# Patient Record
Sex: Female | Born: 1945 | Race: White | Smoking: Never smoker
Health system: Northeastern US, Academic
[De-identification: ages and names within clinical notes are randomized; demographics above are authoritative.]

## PROBLEM LIST (undated history)

## (undated) DIAGNOSIS — C4491 Basal cell carcinoma of skin, unspecified: Secondary | ICD-10-CM

## (undated) DIAGNOSIS — M1990 Unspecified osteoarthritis, unspecified site: Secondary | ICD-10-CM

## (undated) DIAGNOSIS — E039 Hypothyroidism, unspecified: Secondary | ICD-10-CM

## (undated) DIAGNOSIS — F32A Depression, unspecified: Secondary | ICD-10-CM

## (undated) DIAGNOSIS — M797 Fibromyalgia: Secondary | ICD-10-CM

## (undated) DIAGNOSIS — C259 Malignant neoplasm of pancreas, unspecified: Secondary | ICD-10-CM

## (undated) HISTORY — DX: Malignant neoplasm of pancreas, unspecified: C25.9

## (undated) HISTORY — PX: TONSILLECTOMY AND ADENOIDECTOMY: SHX28

## (undated) HISTORY — DX: Unspecified osteoarthritis, unspecified site: M19.90

## (undated) HISTORY — PX: APPENDECTOMY: SHX54

## (undated) HISTORY — PX: KNEE SURGERY: SHX244

## (undated) HISTORY — PX: CERVICAL FUSION: SHX112

## (undated) HISTORY — DX: Basal cell carcinoma of skin, unspecified: C44.91

## (undated) HISTORY — PX: OTHER SURGICAL HISTORY: SHX169

## (undated) HISTORY — PX: ROTATOR CUFF REPAIR: SHX139

---

## 1986-08-04 HISTORY — PX: HYSTERECTOMY: SHX81

## 2013-11-02 ENCOUNTER — Inpatient Hospital Stay: Admit: 2013-11-02 | Discharge: 2013-11-02 | Disposition: A | Discharge status: Home / Self Care | Payer: Self-pay

## 2014-11-10 ENCOUNTER — Inpatient Hospital Stay: Admit: 2014-11-10 | Discharge: 2014-11-10 | Disposition: A | Discharge status: Home / Self Care | Payer: Self-pay

## 2014-12-09 ENCOUNTER — Inpatient Hospital Stay: Admit: 2014-12-09 | Discharge: 2014-12-09 | Disposition: A | Discharge status: Home / Self Care | Payer: Self-pay

## 2015-12-20 ENCOUNTER — Inpatient Hospital Stay: Admit: 2015-12-20 | Discharge: 2015-12-20 | Disposition: A | Discharge status: Home / Self Care | Payer: Self-pay

## 2016-03-05 DIAGNOSIS — Z9221 Personal history of antineoplastic chemotherapy: Secondary | ICD-10-CM

## 2016-03-05 HISTORY — DX: Personal history of antineoplastic chemotherapy: Z92.21

## 2016-09-06 HISTORY — PX: CHOLECYSTECTOMY, LAPAROSCOPIC: SHX56

## 2016-09-17 ENCOUNTER — Inpatient Hospital Stay: Admit: 2016-09-17 | Discharge: 2016-09-17 | Disposition: A | Discharge status: Home / Self Care | Payer: Self-pay

## 2016-09-25 ENCOUNTER — Inpatient Hospital Stay: Admit: 2016-09-25 | Discharge: 2016-09-25 | Disposition: A | Discharge status: Home / Self Care | Payer: Self-pay

## 2016-09-27 ENCOUNTER — Encounter: Payer: Self-pay | Admitting: Gastroenterology

## 2016-09-29 ENCOUNTER — Inpatient Hospital Stay
Admission: AD | Admit: 2016-09-29 | Discharge: 2016-10-14 | DRG: 264 | Disposition: A | Discharge status: Home Health | Payer: PRIVATE HEALTH INSURANCE | Source: Other Acute Inpatient Hospital | Attending: Oncology | Admitting: Oncology

## 2016-09-29 ENCOUNTER — Inpatient Hospital Stay: Payer: PRIVATE HEALTH INSURANCE

## 2016-09-29 DIAGNOSIS — J9811 Atelectasis: Secondary | ICD-10-CM

## 2016-09-29 DIAGNOSIS — C250 Malignant neoplasm of head of pancreas: Principal | ICD-10-CM | POA: Diagnosis present

## 2016-09-29 DIAGNOSIS — M797 Fibromyalgia: Secondary | ICD-10-CM | POA: Diagnosis present

## 2016-09-29 DIAGNOSIS — R0902 Hypoxemia: Secondary | ICD-10-CM | POA: Diagnosis present

## 2016-09-29 DIAGNOSIS — Z833 Family history of diabetes mellitus: Secondary | ICD-10-CM

## 2016-09-29 DIAGNOSIS — J90 Pleural effusion, not elsewhere classified: Secondary | ICD-10-CM

## 2016-09-29 DIAGNOSIS — F419 Anxiety disorder, unspecified: Secondary | ICD-10-CM | POA: Diagnosis present

## 2016-09-29 DIAGNOSIS — Z803 Family history of malignant neoplasm of breast: Secondary | ICD-10-CM

## 2016-09-29 DIAGNOSIS — R110 Nausea: Secondary | ICD-10-CM | POA: Diagnosis not present

## 2016-09-29 DIAGNOSIS — K5900 Constipation, unspecified: Secondary | ICD-10-CM | POA: Diagnosis present

## 2016-09-29 DIAGNOSIS — Z8249 Family history of ischemic heart disease and other diseases of the circulatory system: Secondary | ICD-10-CM

## 2016-09-29 DIAGNOSIS — R918 Other nonspecific abnormal finding of lung field: Secondary | ICD-10-CM

## 2016-09-29 DIAGNOSIS — E785 Hyperlipidemia, unspecified: Secondary | ICD-10-CM | POA: Diagnosis present

## 2016-09-29 DIAGNOSIS — I517 Cardiomegaly: Secondary | ICD-10-CM

## 2016-09-29 DIAGNOSIS — E039 Hypothyroidism, unspecified: Secondary | ICD-10-CM | POA: Diagnosis present

## 2016-09-29 DIAGNOSIS — E440 Moderate protein-calorie malnutrition: Secondary | ICD-10-CM | POA: Diagnosis present

## 2016-09-29 DIAGNOSIS — K219 Gastro-esophageal reflux disease without esophagitis: Secondary | ICD-10-CM | POA: Diagnosis present

## 2016-09-29 DIAGNOSIS — G4700 Insomnia, unspecified: Secondary | ICD-10-CM | POA: Diagnosis not present

## 2016-09-29 DIAGNOSIS — G893 Neoplasm related pain (acute) (chronic): Secondary | ICD-10-CM | POA: Diagnosis present

## 2016-09-29 DIAGNOSIS — I10 Essential (primary) hypertension: Secondary | ICD-10-CM | POA: Diagnosis present

## 2016-09-29 DIAGNOSIS — C259 Malignant neoplasm of pancreas, unspecified: Secondary | ICD-10-CM

## 2016-09-29 DIAGNOSIS — R197 Diarrhea, unspecified: Secondary | ICD-10-CM | POA: Diagnosis not present

## 2016-09-29 DIAGNOSIS — Z6823 Body mass index (BMI) 23.0-23.9, adult: Secondary | ICD-10-CM

## 2016-09-29 DIAGNOSIS — Z515 Encounter for palliative care: Secondary | ICD-10-CM | POA: Diagnosis present

## 2016-09-29 HISTORY — DX: Hypothyroidism, unspecified: E03.9

## 2016-09-29 HISTORY — DX: Fibromyalgia: M79.7

## 2016-09-29 LAB — CBC AND DIFFERENTIAL
Baso # K/uL: 0.1 10*3/uL (ref 0.0–0.1)
Basophil %: 1 %
Eos # K/uL: 0.3 10*3/uL (ref 0.0–0.4)
Eosinophil %: 3.4 %
Hematocrit: 34 % (ref 34–45)
Hemoglobin: 11.3 g/dL (ref 11.2–15.7)
IMM Granulocytes #: 0 10*3/uL (ref 0.0–0.1)
IMM Granulocytes: 0.2 %
Lymph # K/uL: 2.2 10*3/uL (ref 1.2–3.7)
Lymphocyte %: 26.7 %
MCH: 32 pg/cell (ref 26–32)
MCHC: 33 g/dL (ref 32–36)
MCV: 96 fL — ABNORMAL HIGH (ref 79–95)
Mono # K/uL: 0.8 10*3/uL (ref 0.2–0.9)
Monocyte %: 9.6 %
Neut # K/uL: 4.8 10*3/uL (ref 1.6–6.1)
Nucl RBC # K/uL: 0 10*3/uL (ref 0.0–0.0)
Nucl RBC %: 0 /100 WBC (ref 0.0–0.2)
Platelets: 405 10*3/uL — ABNORMAL HIGH (ref 160–370)
RBC: 3.5 MIL/uL — ABNORMAL LOW (ref 3.9–5.2)
RDW: 13.1 % (ref 11.7–14.4)
Seg Neut %: 59.1 %
WBC: 8 10*3/uL (ref 4.0–10.0)

## 2016-09-29 LAB — COMPREHENSIVE METABOLIC PANEL
ALT: 15 U/L (ref 0–35)
AST: 21 U/L (ref 0–35)
Albumin: 3.6 g/dL (ref 3.5–5.2)
Alk Phos: 72 U/L (ref 35–105)
Anion Gap: 13 (ref 7–16)
Bilirubin,Total: <0.2 mg/dL (ref 0.0–1.2)
CO2: 27 mmol/L (ref 20–28)
Calcium: 9.3 mg/dL (ref 8.6–10.2)
Chloride: 100 mmol/L (ref 96–108)
Creatinine: 0.59 mg/dL (ref 0.51–0.95)
GFR,Black: 107 *
GFR,Caucasian: 93 *
Glucose: 101 mg/dL — ABNORMAL HIGH (ref 60–99)
Lab: 7 mg/dL (ref 6–20)
Potassium: 3.9 mmol/L (ref 3.3–5.1)
Sodium: 140 mmol/L (ref 133–145)
Total Protein: 5.9 g/dL — ABNORMAL LOW (ref 6.3–7.7)

## 2016-09-29 LAB — PHOSPHORUS: Phosphorus: 3.7 mg/dL (ref 2.7–4.5)

## 2016-09-29 LAB — HOLD BLUE

## 2016-09-29 LAB — HOLD RED

## 2016-09-29 LAB — HOLD LAVENDER

## 2016-09-29 LAB — MAGNESIUM: Magnesium: 1.3 mEq/L (ref 1.3–2.1)

## 2016-09-29 MED ORDER — METOCLOPRAMIDE HCL 5 MG/ML IJ SOLN *I*
5.0000 mg | Freq: Four times a day (QID) | INTRAMUSCULAR | Status: DC | PRN
Start: 2016-09-29 — End: 2016-10-06

## 2016-09-29 MED ORDER — HYDROMORPHONE PCA 1 MG/ML *WRAPPED*
INTRAMUSCULAR | Status: DC
Start: 2016-09-29 — End: 2016-10-12
  Filled 2016-09-29 (×14): qty 25

## 2016-09-29 MED ORDER — ONDANSETRON HCL 2 MG/ML IV SOLN *I*
4.0000 mg | Freq: Four times a day (QID) | INTRAMUSCULAR | Status: DC | PRN
Start: 2016-09-29 — End: 2016-10-14

## 2016-09-29 MED ORDER — TRIAMTERENE-HCTZ 37.5-25 MG PO TABS *I*
1.0000 | ORAL_TABLET | Freq: Every day | ORAL | Status: DC
Start: 2016-09-30 — End: 2016-10-14
  Administered 2016-09-30 – 2016-10-14 (×15): 1 via ORAL
  Filled 2016-09-29 (×16): qty 1

## 2016-09-29 MED ORDER — ENOXAPARIN SODIUM 40 MG/0.4ML IJ SOSY *I*
40.0000 mg | PREFILLED_SYRINGE | Freq: Every day | INTRAMUSCULAR | Status: DC
Start: 2016-09-29 — End: 2016-10-02
  Administered 2016-09-29 – 2016-10-01 (×3): 40 mg via SUBCUTANEOUS
  Filled 2016-09-29 (×3): qty 0.4

## 2016-09-29 MED ORDER — SUCRALFATE 1 GM PO TABS *I*
1.0000 g | ORAL_TABLET | Freq: Three times a day (TID) | ORAL | Status: DC
Start: 2016-09-29 — End: 2016-09-29

## 2016-09-29 MED ORDER — IOHEXOL 350 MG/ML (OMNIPAQUE) IV SOLN *I*
1.0000 mL | Freq: Once | INTRAVENOUS | Status: AC
Start: 2016-09-29 — End: 2016-09-29
  Administered 2016-09-29: 70 mL via INTRAVENOUS

## 2016-09-29 MED ORDER — SENNOSIDES 8.6 MG PO TABS *I*
2.0000 | ORAL_TABLET | Freq: Every evening | ORAL | Status: DC
Start: 2016-09-29 — End: 2016-09-30
  Administered 2016-09-29: 2 via ORAL
  Filled 2016-09-29: qty 2

## 2016-09-29 MED ORDER — DOCUSATE SODIUM 100 MG PO CAPS *I*
200.0000 mg | ORAL_CAPSULE | Freq: Two times a day (BID) | ORAL | Status: DC
Start: 2016-09-29 — End: 2016-10-14
  Administered 2016-09-29 – 2016-10-14 (×24): 200 mg via ORAL
  Filled 2016-09-29 (×28): qty 2

## 2016-09-29 MED ORDER — POTASSIUM CHLORIDE CRYS CR 10 MEQ PO TBCR *I*
40.0000 meq | ORAL_TABLET | Freq: Every day | ORAL | Status: DC
Start: 2016-09-30 — End: 2016-10-12
  Administered 2016-09-30 – 2016-10-11 (×12): 40 meq via ORAL
  Filled 2016-09-29 (×2): qty 4
  Filled 2016-09-29: qty 2
  Filled 2016-09-29 (×9): qty 4

## 2016-09-29 MED ORDER — ACETAMINOPHEN 325 MG PO TABS *I*
650.0000 mg | ORAL_TABLET | Freq: Four times a day (QID) | ORAL | Status: DC | PRN
Start: 2016-09-29 — End: 2016-10-14
  Administered 2016-09-30: 650 mg via ORAL
  Filled 2016-09-29: qty 2

## 2016-09-29 MED ORDER — SODIUM CHLORIDE 0.9 % IV SOLN WRAPPED *I*
100.0000 mL/h | Status: DC
Start: 2016-09-29 — End: 2016-10-04
  Administered 2016-09-29 – 2016-10-04 (×8): 100 mL/h via INTRAVENOUS

## 2016-09-29 MED ORDER — PREGABALIN 50 MG PO CAPS *I*
50.0000 mg | ORAL_CAPSULE | Freq: Two times a day (BID) | ORAL | Status: DC
Start: 2016-09-29 — End: 2016-10-05
  Administered 2016-09-29 – 2016-10-05 (×11): 50 mg via ORAL
  Filled 2016-09-29 (×11): qty 1

## 2016-09-29 MED ORDER — ASPIRIN 81 MG PO CHEW *I*
81.0000 mg | CHEWABLE_TABLET | Freq: Every day | ORAL | Status: DC
Start: 2016-09-30 — End: 2016-10-14
  Administered 2016-09-30 – 2016-10-13 (×14): 81 mg via ORAL
  Filled 2016-09-29 (×16): qty 1

## 2016-09-29 MED ORDER — DOCUSATE SODIUM 100 MG PO CAPS *I*
100.0000 mg | ORAL_CAPSULE | Freq: Two times a day (BID) | ORAL | Status: DC
Start: 2016-09-29 — End: 2016-09-29

## 2016-09-29 MED ORDER — MULTIVITAMIN ADULTS 50+ PO TABS *I*
1.0000 | ORAL_TABLET | Freq: Every day | ORAL | Status: DC
Start: 2016-09-30 — End: 2016-10-14
  Administered 2016-09-30 – 2016-10-13 (×14): 1 via ORAL
  Filled 2016-09-29 (×17): qty 1

## 2016-09-29 MED ORDER — POLYETHYLENE GLYCOL 3350 PO PACK 17 GM *I*
17.0000 g | PACK | Freq: Every day | ORAL | Status: DC
Start: 2016-09-29 — End: 2016-09-30
  Administered 2016-09-29 – 2016-09-30 (×2): 17 g via ORAL
  Filled 2016-09-29 (×2): qty 17

## 2016-09-29 MED ORDER — SENNOSIDES 8.6 MG PO TABS *I*
1.0000 | ORAL_TABLET | Freq: Every evening | ORAL | Status: DC | PRN
Start: 2016-09-29 — End: 2016-09-29

## 2016-09-29 MED ORDER — PANTOPRAZOLE SODIUM 40 MG PO TBEC *I*
40.0000 mg | DELAYED_RELEASE_TABLET | Freq: Every morning | ORAL | Status: DC
Start: 2016-09-30 — End: 2016-10-08
  Administered 2016-09-30 – 2016-10-08 (×9): 40 mg via ORAL
  Filled 2016-09-29 (×8): qty 1

## 2016-09-29 MED ORDER — LEVOTHYROXINE SODIUM 25 MCG PO TABS *I*
137.0000 ug | ORAL_TABLET | Freq: Every day | ORAL | Status: DC
Start: 2016-09-30 — End: 2016-10-14
  Administered 2016-09-30 – 2016-10-14 (×14): 137 ug via ORAL
  Filled 2016-09-29 (×16): qty 1

## 2016-09-29 NOTE — Progress Notes (Signed)
Assumed care of patient 1600-1900 .  VSS throughout shift.  PCA initiated per order. Please refer to patient flowsheets and eMAR  for further assessments and documentation.    Ardelle Park, RN

## 2016-09-29 NOTE — H&P (Signed)
Hematology/Oncology H&P:      LOS: 0 days     CC: RUQ abd pain    HPI: [name] who was a transfer from OSH on 09/29/16 for workup for a pancreatic mass and pain control. The pain began at the beginning of April.      She originally saw a GI specialist who felt it could be constipation.  She was scoped and a small hiatal hernia was found in addition to mild gastric inflammation.  Carafate was started without effect. She went to the ED and an abdominal ultrasound was performed with gallbladder inflammation noted. She saw a Education officer, environmental and had a cholecystectomy about 1 month ago.  There was a brief resolution in her symptoms but only for a few days. When the pain returned it was treated as post operative pain/muscoskeletal pain. A work up was performed for but failed to demonstrate choledocholithiasis.     Her pain became so severe that she went to an ED in [place], where CT a/p was negative 2 weeks ago and she was sent home.  She returned 1 week later and was admitted for a work up and pain management.  An MRCP revealed a mass in head of the pancreas and a LN in the celiac plexus. ERCP was performed, biopsy pending.     A PCA was initiated but only with prn dosing that helped but did not alleviate the pain.  Per pt's daughter they did not feel comfortable beginning a continuous infusion due to a new oxygen requirement.  She has never previously required oxygen and this was felt to be due to the cholecystectomy a few weeks ago.  She did not receive workup at OSH for hypoxia.  She has no hx of DVT and denies cough or hemoptysis but occasionally has SOB with activity.  She denies any lower extremity edema or pain.    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  She was told the mass is "on a nerve" which is why she may be in so much pain.    Oncology Hx:     none    Subjective:     No past medical history on file.    Social History     Social  History    Marital status: Divorced     Spouse name: N/A    Number of children: N/A    Years of education: N/A     Occupational History    Not on file.     Social History Main Topics    Smoking status: Not on file    Smokeless tobacco: Not on file    Alcohol use Not on file    Drug use: Not on file    Sexual activity: Not on file     Social History Narrative     No family history on file.    Review of Systems   Constitutional: Negative for chills and fever.   Respiratory: Positive for shortness of breath. Negative for hemoptysis and wheezing.    Cardiovascular: Negative for chest pain and leg swelling.   Gastrointestinal: Positive for abdominal pain, constipation (no bm x 1 week), nausea and vomiting.       Objective:     Blood pressure 118/64, pulse 74, temperature 36.7 C (98.1 F), temperature source Temporal, SpO2 97 %.    Physical Exam  Gen: Resting comfortably in bed, NAD, son and daughter at  bedside  HEENT: Oropharynx clear, MMM  CV: RRR, nml S1/S2, no m/r/g  Chest: CTA B/L  Abd: +BS, ND, no hepatosplenomegaly, generalized tenderness present   Ext: WWP, +pedal pulses, no edema lower extremities bilaterally  Neuro: AAOx3, CNs grossly intact, no focal deficits    Labs/Imaging: Reviewed: See results for further details    Assessment/Plan:     Patient is a 71 y.o.  female who has a newly found pancreatic mass and was transferred to [facility] from an OSH on 09/29/2016 for further workup and pain control.    New pancreatic head mass  - has not established care with an oncologist yet  - has not received treatment yet  - biopsy at [facility] is pending, need to follow up on results  - need to obtain staging scans here  - [facility] obtain [facility]  - CA 19-9 at [facility] was 883  - CBC, CMP     Pain  - dilaudid PCA  - consider celiac plexus block  - tylenol prn  - plan to call palliative care in AM    Hypoxia  - did not get any workup at OSH  - will obtain CT angio    HTN/HLD  - cont home ASA  - cont maxzide (pt takes this  for edema - has been on this long term)    Hypothyroidism  - cont synthroid  - tsh ordered    Constipation/PUD ppx/Nausea  - Colace/senna  - Miralax daily  - Zofran PRN  - protonix daily  - reglan prn    DVT/PPX:   - Lovenox SQ     Chronic diagnoses for this hospitalization include:  none    FEN:  - PO, MIVF @100mL /hr  - Labs daily, replete PRN  - Regular Diet    DISPO: Pending above    Full code    Author: William Dalton, PA  as of: 09/29/2016  at: 4:46 PM

## 2016-09-29 NOTE — Progress Notes (Signed)
Admission skin assessment performed with Ardelle Park, RN no lesions or abnormalities notes on assessment.   Gaynelle Adu, RN

## 2016-09-30 ENCOUNTER — Other Ambulatory Visit: Payer: Self-pay

## 2016-09-30 ENCOUNTER — Inpatient Hospital Stay: Payer: PRIVATE HEALTH INSURANCE

## 2016-09-30 DIAGNOSIS — K869 Disease of pancreas, unspecified: Secondary | ICD-10-CM

## 2016-09-30 DIAGNOSIS — R938 Abnormal findings on diagnostic imaging of other specified body structures: Secondary | ICD-10-CM

## 2016-09-30 DIAGNOSIS — R1013 Epigastric pain: Secondary | ICD-10-CM

## 2016-09-30 DIAGNOSIS — R52 Pain, unspecified: Secondary | ICD-10-CM

## 2016-09-30 LAB — CBC AND DIFFERENTIAL
Baso # K/uL: 0.1 10*3/uL (ref 0.0–0.1)
Basophil %: 0.6 %
Eos # K/uL: 0.4 10*3/uL (ref 0.0–0.4)
Eosinophil %: 4.6 %
Hematocrit: 32 % — ABNORMAL LOW (ref 34–45)
Hemoglobin: 10.6 g/dL — ABNORMAL LOW (ref 11.2–15.7)
IMM Granulocytes #: 0 10*3/uL (ref 0.0–0.1)
IMM Granulocytes: 0.4 %
Lymph # K/uL: 2.5 10*3/uL (ref 1.2–3.7)
Lymphocyte %: 32.7 %
MCH: 32 pg/cell (ref 26–32)
MCHC: 33 g/dL (ref 32–36)
MCV: 96 fL — ABNORMAL HIGH (ref 79–95)
Mono # K/uL: 0.7 10*3/uL (ref 0.2–0.9)
Monocyte %: 8.8 %
Neut # K/uL: 4.1 10*3/uL (ref 1.6–6.1)
Nucl RBC # K/uL: 0 10*3/uL (ref 0.0–0.0)
Nucl RBC %: 0 /100 WBC (ref 0.0–0.2)
Platelets: 367 10*3/uL (ref 160–370)
RBC: 3.3 MIL/uL — ABNORMAL LOW (ref 3.9–5.2)
RDW: 13.1 % (ref 11.7–14.4)
Seg Neut %: 52.9 %
WBC: 7.8 10*3/uL (ref 4.0–10.0)

## 2016-09-30 LAB — COMPREHENSIVE METABOLIC PANEL
ALT: 13 U/L (ref 0–35)
AST: 14 U/L (ref 0–35)
Albumin: 3.4 g/dL — ABNORMAL LOW (ref 3.5–5.2)
Alk Phos: 65 U/L (ref 35–105)
Anion Gap: 10 (ref 7–16)
Bilirubin,Total: 0.2 mg/dL (ref 0.0–1.2)
CO2: 28 mmol/L (ref 20–28)
Calcium: 8.9 mg/dL (ref 8.6–10.2)
Chloride: 102 mmol/L (ref 96–108)
Creatinine: 0.72 mg/dL (ref 0.51–0.95)
GFR,Black: 98 *
GFR,Caucasian: 85 *
Glucose: 102 mg/dL — ABNORMAL HIGH (ref 60–99)
Lab: 9 mg/dL (ref 6–20)
Potassium: 4.1 mmol/L (ref 3.3–5.1)
Sodium: 140 mmol/L (ref 133–145)
Total Protein: 5.5 g/dL — ABNORMAL LOW (ref 6.3–7.7)

## 2016-09-30 LAB — TSH: TSH: 5.53 u[IU]/mL — ABNORMAL HIGH (ref 0.27–4.20)

## 2016-09-30 LAB — PHOSPHORUS: Phosphorus: 4.5 mg/dL (ref 2.7–4.5)

## 2016-09-30 LAB — MAGNESIUM: Magnesium: 1.3 mEq/L (ref 1.3–2.1)

## 2016-09-30 MED ORDER — POLYETHYLENE GLYCOL 3350 PO PACK 17 GM *I*
17.0000 g | PACK | Freq: Two times a day (BID) | ORAL | Status: DC
Start: 2016-09-30 — End: 2016-10-14
  Administered 2016-09-30 – 2016-10-11 (×5): 17 g via ORAL
  Filled 2016-09-30 (×25): qty 17

## 2016-09-30 MED ORDER — DEXAMETHASONE 4 MG PO TABS *I*
4.0000 mg | ORAL_TABLET | Freq: Two times a day (BID) | ORAL | Status: DC
Start: 2016-09-30 — End: 2016-10-12
  Administered 2016-09-30 – 2016-10-12 (×24): 4 mg via ORAL
  Filled 2016-09-30 (×24): qty 1

## 2016-09-30 MED ORDER — IOHEXOL 350 MG/ML (OMNIPAQUE) IV SOLN *I*
1.0000 mL | Freq: Once | INTRAVENOUS | Status: AC
Start: 2016-09-30 — End: 2016-09-30
  Administered 2016-09-30: 100 mL via INTRAVENOUS

## 2016-09-30 MED ORDER — STERILE WATER FOR IRRIGATION IR SOLN *I*
900.0000 mL | Freq: Once | Status: AC
Start: 2016-09-30 — End: 2016-09-30
  Administered 2016-09-30: 900 mL via ORAL

## 2016-09-30 MED ORDER — SENNOSIDES 8.6 MG PO TABS *I*
2.0000 | ORAL_TABLET | Freq: Two times a day (BID) | ORAL | Status: DC
Start: 2016-09-30 — End: 2016-10-14
  Administered 2016-09-30 – 2016-10-14 (×20): 2 via ORAL
  Filled 2016-09-30 (×26): qty 2

## 2016-09-30 MED ORDER — PROCHLORPERAZINE MALEATE 10 MG PO TABS *I*
10.0000 mg | ORAL_TABLET | Freq: Once | ORAL | Status: AC
Start: 2016-09-30 — End: 2016-09-30
  Administered 2016-09-30: 10 mg via ORAL
  Filled 2016-09-30: qty 1

## 2016-09-30 NOTE — Progress Notes (Signed)
Assumed care of patient 0700-1900. Patient continues to have moderate/severe ABD pain on PCA. Per patient and family, pain is controlled much better here than OSH. Patient is lethargic throughout shift but easily arousable. No respiratory depression, however, lowest RR was 12 when patient was sleeping. Respiratory status monitored closely throughout shift  All other VSS throughout shift.  Please refer to patient flowsheets and eMAR  for further assessments and documentation.    Ardelle Park, RN

## 2016-09-30 NOTE — Progress Notes (Signed)
Hematology/Oncology Progress Note:      LOS: 1 day     HPI: [name] who was a transfer from OSH on 09/29/16 for workup for a pancreatic mass and pain control.    Interval Hx/Subjective:      - VSS, afebrile  - patient is very uncomfortable, feels the PCA is not helping  - she is very anxious about what is to come  - has not had a BM in about 1 week    Review of Systems   Constitutional: Positive for malaise/fatigue. Negative for chills, fever and weight loss.   Respiratory: Positive for shortness of breath. Negative for cough.    Cardiovascular: Negative for chest pain and leg swelling.   Gastrointestinal: Positive for abdominal pain, constipation and nausea. Negative for diarrhea and vomiting.   Musculoskeletal: Positive for back pain.     Objective:     Blood pressure 118/78, pulse 65, temperature 37.1 C (98.8 F), temperature source Temporal, resp. rate 12, weight 67.1 kg (148 lb), SpO2 98 %.    Physical Exam  Gen: Pale and appears fatigued, in obvious discomfort  CV: RRR  Chest: CTA B/L- wearing NC  Abd: hypoactive BS, soft and nontender  Ext: WWP, +pedal pulses, no edema  Neuro: AAOx3, CNs grossly intact, no focal deficits    Assessment/Plan:     Patient is a 71 y.o.  female who has a newly found pancreatic mass and was transferred to [facility] from an OSH on 09/29/2016 for further workup and pain control.    New pancreatic head mass  - has not established care with an oncologist yet  - has not received treatment yet  - biopsy at [facility] is pending- need to follow up on results  - obtaing staging Ct abd/pelvis today w/ pancreatic protocol- PENDING  - need to obtain imaging from [facility]  - CA 19-9 at [facility] was 883  - CBC, CMP     Pain  - dilaudid PCA- will inc continuous to 0.5  - consider celiac plexus block  - tylenol prn    Hypoxia  - did not get any workup at OSH  - CT angio- negative for PE, possible signs of aspiration  - incentive spirometry    HTN/HLD  - cont home ASA  - cont maxzide (pt takes  this for edema - has been on this long term)    Hypothyroidism  - cont synthroid    Constipation/PUD ppx/Nausea  - Colace BID  - inc senna to BID  - Miralax BID  - Zofran PRN  - protonix daily  - reglan prn    DVT/PPX:   - Lovenox SQ     FEN:  - PO, MIVF @100mL /hr  - Labs daily, replete PRN  - Regular Diet    Full code    DISPO: Pending above  - Follow up with: needs to be set up with GI oncologist  - Tentative Discharge Date: unknown  - Rx/Equipment Needed: TBD    Author: William Dalton, PA  as of: 09/30/2016  at: 9:57 AM

## 2016-09-30 NOTE — Progress Notes (Signed)
Amanda Galloway currently requires hands on assistance for mobility. Anticipate patient will need 2-3 more PT visits to allow return to prior living environment. Discharge Recommendations: Anticipate return to prior living arrangement, Home PT after discharge from the hospital.     PT Discharge Equipment Recommended: To be determined (pending progress in PT may need RW)     Physical Therapy Initial Evaluation:     History of Present Admission: Per MD notes, Patient is a 71 y.o.femalewho was a transfer from OSH on 09/29/16 for workup for a pancreatic massand pain control    Past Medical History:   Diagnosis Date    Cancer         No past surgical history on file.    Personal factors affecting treatment/recovery:   none identified    Comorbidities affecting treatment/recovery:   Cancer (chemotherapy/radiation therapy)    Clinical presentation:   stable    Patient complexity:     low level as indicated by above stability of condition, personal factors, environmental factors and comorbidities in addition to their impairments found on physical exam.     09/30/16 1330   Prior Living    Prior Living Situation Reported by family   Lives With Family   Type of Home 2 Story home   # Steps to [place] 2   # Rails to [place] 0   # Of Steps In Home 12   # Rails in Home 1   Location of Bedrooms 2nd floor   Additional Comments Patient lived alone in [place], [place]. Daughter reported plan for patient to return home to live with her.    Prior Function Level   Prior Function Level Reported by family   Transfers Independent   Walking Independent;Community distances   Walking assistive devices used None   Stair negotiation Independent   Additional Comments Patient was independent with all mobility, driving, and still working as Ship broker.   PT Tracking   PT [place]   Visit Number   Visit Number [facility]) / Treatment Day (HH) 1   Precautions/Observations   Precautions used Yes   LDA Observation IV lines   Fall  Precautions General falls precautions   Other Patient supine in bed with daughter at bedside who is loving and supportive. Daughter is an Therapist, sports. Patient sleeping, daughter reporting she has been in/out of sleep through out the day. Patietn appears lethargic.   Pain Assessment   *Is the patient currently in pain? Denies   Additional comments Patient confused, unsure if she is in pain.   Vision    Current Vision Wears corrective lenses   Cognition   Arousal/Alertness Delayed responses to stimuli  (lethargic; eyes closed intermittently throughout treatment)   Orientation A&Ox3   Following Commands Follows simple commands with increased time;Follows simple commands with repetition   UE Assessment   UE Assessment Full range RUE AROM;Full range LUE AROM   LUE Strength   Overall Strength WFL able to perform ADL tasks with strength   RUE Strength   Overall Strength WFL able to perform ADL tasks with strength   LE Assessment   LE Assessment Full AROM RLE;Full AROM LLE   Strength LLE   HIP FLEXION (4+/5)   Knee Flexion 5/5   Knee Extension 5/5   Ankle Dorsiflexion 5/5   Ankle Plantar Flexion 5/5   Strength RLE   HIP FLEXION (4+/5)   Knee Flexion 5/5   Knee Extension 5/5   Ankle Dorsiflexion 5/5  Ankle Plantar Flexion 5/5   Sensation   Sensation No apparent deficit   Bed Mobility   Bed mobility Tested   Supine to Sit Modified independent (device);Side rails up (#);Head of bed elevated   Sit to Supine Modified independent (device);Side rails up (#);Head of bed elevated   Supine to Long Sit Independent   Additional comments Patient presents with no difficulty completing bed mobility. Further assessment without use of rails and head of bed flat.   Transfers   Transfers Tested   Sit to Stand Minimum ;1 person assist   Stand to sit Contact guard;1 person assist   Transfer Assistive Device rolling walker   Additional comments Patient required minAx1 for sit to stand. She presented with unsteadiness upon initial standing requiring  minAx1.   Mobility   Mobility Tested   Gait Pattern Decreased cadence;Unsteady   Ambulation Assist Minimal;1 person assist   Ambulation Distance (Feet) 20   Ambulation Assistive Device rolling walker   Additional comments Patient initially required minAx1 for ambulation and verbal cues for appropriate use fo walker. She was able to progress to contact guard for steadying assist. Patient expereinced 1 slight loss of balance with changing directions and use of walker, able to correct with minAx1.   Therapeutic Exercises   Additional comments Educated daughter and patient on daily ambulation to promote mobility. reviewed seated exercises with daughter for patient to complete when upright in chair, consisting of LAQ and marches.   Balance   Balance Tested   Sitting - Static Unsupported;Contact guard   Standing - Static Min assist;Supported   Standing - Dynamic Min assist;Supported   Additional Comments Patient required increased assist for balance due to lethargic state. When sitting at edge of bed, patient had difficulty maintaining her eyes open and presented with minimal sway.   PT AM-PAC Mobility   Turning over in bed? 1   Sitting down on and standing up from a chair with arms? 1   Moving from lying on back to sitting on the side of the bed? 1   Moving to and from a bed to a chair? 3   Need to walk in hospital room? 3   Climbing 3 - 5 steps with a railing? 1   Total Raw Score 10   Standardized Score 32.29   CMS 1-100% Score 77   Medicare Functional Limit Modifiers CL  60 - < 80% impaired, limited, restricted   Additional Comments   Additional comments Patient presented in a lethargic state therefore requriing increased assist with mobility. Anticipate with improved alertness, patient able to improve participation in therapy and will require decrease level of assist.   Assessment   Brief Assessment Appropriate for skilled therapy   Problem List Impaired balance;Impaired transfers;Impaired ambulation;Impaired stair  navigation;Impaired functional mobility   Patient / Family Goal get back to being her 'normal' self   Additional Comments Patient currently required hands on assist for transfers and ambulation. She will benefit from skilled PT to address impairments to maximize function as she was independent without AD at baseline.   Plan/Recommendation   Treatment Interventions Restorative PT;AROM;Transfers training;Balance training;Stair training;Pt/Family education;Strengthening;Gait training;D/C planning   PT Frequency 3-5x/wk   Hospital Stay Recommendations Out of bed with nursing assist;Ambulate daily with nursing assist;May be out of bed with family assist;May ambulate with family assist  (1A RW)   Discharge Recommendations Anticipate return to prior living arrangement;Home PT   PT Discharge Equipment Recommended To be determined  (pending progress in PT may  need RW)   Assessment/Recommendations Reviewed With: Patient;Family;Nursing   Next Visit progressive ambulation, continue assessment of AD   Additional Recommendations Recommend daily ambulation 3x/day in hallways as patient is able to tolerate.   Time Calculation   PT Timed Codes 0   PT Untimed Codes 24   PT Unbilled Time 0   PT Total Treatment 24   Plan and Onset date   Plan of Care Date 09/30/16   Onset Date 09/29/16   Treatment Start Date 09/30/16   PT Charges   $PT [facility] Charges Acute PT Eval Low Complexity - code 7161     Rodena Piety, PT, DPT  Pager [phone]

## 2016-09-30 NOTE — Plan of Care (Signed)
Problem: Impaired Bed Mobility  Goal: STG - IMPROVE BED MOBILITY  Patient will perform bed mobility without rails and the head of bed flat with No assist (Independently)     Time frame: 3-5 days    Problem: Impaired Transfers  Goal: LTG - IMPROVE TRANSFERS  Patient will complete Sit to stand transfers using least restrictive assistive device with Modified independence     Time frame: 7-10 days    Problem: Impaired Ambulation  Goal: LTG - IMPROVE AMBULATION  Patient will ambulate greater than 300 feet using least restrictive assistive device with Modified independence    Time frame: 7-10 days    Problem: Impaired Stair Navigation  Goal: LTG - IMPROVE STAIR NAVIGATION  Patient will navigate greater than 12 steps with 1  rail(s) and least restrictive assistive device and Modified independence     Time frame: 7-10 days

## 2016-09-30 NOTE — Progress Notes (Signed)
09/30/16 1024   [facility] Patient Class Review   Patient Class Review Inpatient   Patient Class Effective 09/29/16      Brigid Re, RN, BSN  Utilization Management  Pager: [phone]

## 2016-09-30 NOTE — Progress Notes (Signed)
Report Given    Diagnostic Imaging Nurse Handoff:    Study:CT  A&O:Y  Telemetry:N  IV Access:PIV  Precautions:N  Transport:bed/hover  Contrast (Oral/NG): Oral  Respiratory:2lt  Report Received From:bedside RN        Procedure Summary

## 2016-09-30 NOTE — Progress Notes (Signed)
Assumed care of patient 1900-0730. VSS throughout shift. Please refer to patient flowsheets and eMAR for further assessments and documentation.    Nursing will continue to monitor-  Arath Kaigler P Beatrice Ziehm, RN

## 2016-10-01 ENCOUNTER — Encounter: Payer: Self-pay | Admitting: Student in an Organized Health Care Education/Training Program

## 2016-10-01 DIAGNOSIS — K862 Cyst of pancreas: Secondary | ICD-10-CM

## 2016-10-01 DIAGNOSIS — G893 Neoplasm related pain (acute) (chronic): Secondary | ICD-10-CM

## 2016-10-01 LAB — COMPREHENSIVE METABOLIC PANEL
ALT: 31 U/L (ref 0–35)
AST: 36 U/L — ABNORMAL HIGH (ref 0–35)
Albumin: 3.7 g/dL (ref 3.5–5.2)
Alk Phos: 81 U/L (ref 35–105)
Anion Gap: 15 (ref 7–16)
Bilirubin,Total: 0.2 mg/dL (ref 0.0–1.2)
CO2: 25 mmol/L (ref 20–28)
Calcium: 9.3 mg/dL (ref 8.6–10.2)
Chloride: 98 mmol/L (ref 96–108)
Creatinine: 0.64 mg/dL (ref 0.51–0.95)
GFR,Black: 104 *
GFR,Caucasian: 91 *
Glucose: 137 mg/dL — ABNORMAL HIGH (ref 60–99)
Lab: 6 mg/dL (ref 6–20)
Potassium: 4.7 mmol/L (ref 3.3–5.1)
Sodium: 138 mmol/L (ref 133–145)
Total Protein: 6 g/dL — ABNORMAL LOW (ref 6.3–7.7)

## 2016-10-01 LAB — CBC AND DIFFERENTIAL
Baso # K/uL: 0 10*3/uL (ref 0.0–0.1)
Basophil %: 0.4 %
Eos # K/uL: 0 10*3/uL (ref 0.0–0.4)
Eosinophil %: 0.1 %
Hematocrit: 35 % (ref 34–45)
Hemoglobin: 11.2 g/dL (ref 11.2–15.7)
IMM Granulocytes #: 0 10*3/uL (ref 0.0–0.1)
IMM Granulocytes: 0.3 %
Lymph # K/uL: 0.7 10*3/uL — ABNORMAL LOW (ref 1.2–3.7)
Lymphocyte %: 6.9 %
MCH: 32 pg/cell (ref 26–32)
MCHC: 32 g/dL (ref 32–36)
MCV: 98 fL — ABNORMAL HIGH (ref 79–95)
Mono # K/uL: 0.2 10*3/uL (ref 0.2–0.9)
Monocyte %: 1.8 %
Neut # K/uL: 8.6 10*3/uL — ABNORMAL HIGH (ref 1.6–6.1)
Nucl RBC # K/uL: 0 10*3/uL (ref 0.0–0.0)
Nucl RBC %: 0 /100 WBC (ref 0.0–0.2)
Platelets: 433 10*3/uL — ABNORMAL HIGH (ref 160–370)
RBC: 3.6 MIL/uL — ABNORMAL LOW (ref 3.9–5.2)
RDW: 13 % (ref 11.7–14.4)
Seg Neut %: 90.5 %
WBC: 9.5 10*3/uL (ref 4.0–10.0)

## 2016-10-01 LAB — PREALBUMIN: Prealbumin: 23 mg/dL (ref 20–40)

## 2016-10-01 LAB — MAGNESIUM: Magnesium: 1.3 mEq/L (ref 1.3–2.1)

## 2016-10-01 LAB — PHOSPHORUS: Phosphorus: 3.6 mg/dL (ref 2.7–4.5)

## 2016-10-01 MED ORDER — PROSOURCE NO CARB PO LIQD *I*
30.0000 mL | Freq: Two times a day (BID) | ORAL | Status: DC
Start: 2016-10-01 — End: 2016-10-14
  Administered 2016-10-01 – 2016-10-14 (×24): 30 mL via ORAL

## 2016-10-01 MED ORDER — LACTULOSE 20 GM/30ML PO SOLN *WRAPPED*
20.0000 g | Freq: Three times a day (TID) | ORAL | Status: DC
Start: 2016-10-01 — End: 2016-10-02
  Administered 2016-10-01 (×2): 20 g via ORAL
  Filled 2016-10-01 (×2): qty 30

## 2016-10-01 NOTE — Comprehensive Assessment (Signed)
09/30/16 [place]   Spokesperson Name Rod Mae   Relationship to Patient  Daughter   Phone Number(s) [phone]   Has Spokesperson been notified of admission? Yes   Method of Notification In Person   Alternate Spokesperson? Yes   Spokesperson Alternate [place]   Relationship to Patient  Son   Phone Number(s) [phone]   Emergency Contact other than spokesperson? No      10/01/16 [place] of Residence Other (Comment)  Zachery Conch- patient going to live with dtr in [place] at time of d/c)   Marital status Divorced   Ethnicity/Race Caucasian   Primary Language English   Primary Care Taker of? No one   Risk Factors   Risk Factors Adjustment to Dx/Injury/Illness;Age related issues;Functional impairment   Health Care Directives Screen (Also required for Hospice Patients)   *Has patient (or family) completed any of the following? (select all that apply) None completed   Living Situation   Lives With Family   Comments pt will be going to stay with her daughter at time of d/c Address: [place], [place] [address]  (it is a 2 story  townhome)   Can they assist patient after discharge? Yes   Home Geography   Type of Home 2 Story home   # Of Steps In Home 13  (standard flight)   # Steps to Enter Home 2   Bedroom Second floor   Bathroom Second floor - full;First floor - half   Utilitites Working Yes   Psychosocial   Person assessed Patient   Coping Status Well   Current Goal of Care Curative   Baseline ADL functioning   Transfers Independent   Ambulation Independent   Assistive Device none   Bathing/Grooming Independent   Meal Prep Independent   Able to feed self? Yes   Household maintenance/chores Independent   Able to drive? Yes   Income Information   Vocational Full time employment   Income Situation [name] [employer] No   Home Care Services   Do you currently have  home care services? No   Current home equipment available (none)   SW Home oxygen   Do you have home oxygen? No   Time Spent   Time Spent with Patient (min) 15   Information was obtained from pt and family at bedside. Pt currently lives alone in apartment in [place]. Pt will be going to stay with her daughter at time of d/c. She has a 2 story home with full flight of stairs to bed and full bath. Pt at baseline is independent with ambulation and ADL's without use of assistive equipment.     Family reports no immediate Sw needs or concerns however dtr expresses concerns about finances since out of work. Sw provided family with financial assistance application to start. Let family know that we can assist some and will work with them for future needs as they arise.  Pati Gallo LMSW  5:13 PM  10/01/2016  [place]  Ph- [phone]  Pager- [phone]

## 2016-10-01 NOTE — Continuity of Care (Signed)
Referral to home care [facility], referral given to Joetta Manners, RN for dispo at home. Pt will go home with daughter who lives in [place].

## 2016-10-01 NOTE — Consults (Signed)
Medical Nutrition Therapy - Initial Assessment    Admit Date: 09/29/2016    Reason for consult: Eval & Treat    Patient Summary: 71 year old female who transferred from OSH for workup of pancreatic mass and pain control. She originally saw a GI specialist for constipation, is s/p cholecystectomy 1 month ago- but pain returned soon after. After continued workup an MCRP revealed a mass on head of pancreas, an ERCP was performed with biopsy pending.     Past Medical History:   Diagnosis Date    Cancer      Pertinent Social Hx: Divorced, resides in [place].     Pertinent Meds: reviewed includes 100 mL/hr NS, pantoprazole, ondansetron, metoclopramide, multivitamin, bowel regimen, potasium chloride, hydromorphone, dexamethasone.   Pertinent Labs: reviewed lytes WNL. Glucose acceptable. Renal labs WNL.     Reviewed I/O's  +2,521 mL since admission. Unmeasured UOP x 14.     Access: PIV    Nutrition Hx:    Abdominal pain started around March- intake decreased at that time, worsening recently. Will have a few bites at meals and that's it.    Since adult children moved out (20 yrs ago) has not prioritized nutrition. Will have 3 eggs for breakfast, skips lunch most of the time (occ greek yogurt or a donut), and eggs again for dinner- occ lean cuisine or tuna salad.     Food allergies: NKFA    Current diet: Regular     Nutrition Focused Physical Exam:  Edema: none  Abdomen: hypoactive BS  Skin: WDL  Body fat stores: adequate  Lean body mass stores: mild losses    Anthropometrics:  Height: 170 cm (5'7") stated  Current Weight: 67.1 kg (148 lb); 93% IBW  Ideal Body Weight: 72.3 kg + 10%  BMI: 23.2- normal range for age, lower end  Weight Hx: down 15-20 lbs x 4 months; 9-12% loss- severe for timeframe     Estimated Nutrient Needs: (Based on 67 kg)    1675-2350 kcal/day (25-35 kcal/kg)   80-100 g protein/day (1.2-1.5 g/kg)    1675-2010 mL fluid/day (25-30 mL/kg)    Nutrition Assessment and Diagnosis:   Malnutrition Status: Pt  with evidence of moderate malnutrition related to chronic illness as evidenced by insufficient energy intake (<75% EEE x > 1 month), severe unintentional wt loss (9-12% loss x 4 months), mild loss of muscle mass.    Patient with diminished oral intake, with limited nutrient dense foods, for several years. Intake has decreased significantly since March. Decreased PO intake related to abdominal pain. Writer discussed with patient and family nutrition goals of adequate protein and calorie intake. With general tips to meet needs. Ordering Ensure Enlive QID for patient as well as ProSource BID. Intake has improved today- finishing a majority of her lunch.     Endorses constipation- last bowel movement Saturday 7/21. Ordered for bowel regimen- continue to adjust PRN.      Nutrition Intervention:   Ordered by RD:  1. Ensure Enlive QID  2. 30 mL ProSource BID   3. Weight's qMonday    Additional Recommendations (to be ordered by primary team):  1. Continue regular diet- encourage intake  2. Daily multivitamin with minerals (minimal variety in diet for years)  3. Encourage ambulation as tolerated  4. Monitoring: I/O's, % meals completed in flowsheets, labs per team.     Nutrition Monitoring/Evaluation:   1. Will monitor diet tolerance and intake, nutrition-related labs, weight trend, BM pattern, supplement acceptance.   2.  Will monitor PO tolerance, nutrition-related labs, weight trend, BM pattern.  3. Nutrition to follow up per high nutrition risk protocol.    Ammie Ferrier [place], [place] [phone]

## 2016-10-01 NOTE — Progress Notes (Signed)
Assumed care of patient 1900-0700. VSS throughout shift. Pt complained of abdominal pain- dilaudid 1:1 PCA treating pain well per pt report. Pt stated that she felt "loopy" overnight from the pain medication but remained A+Ox3. Pt had frequent diarrhea overnight post HS bowel medications- seems to be slowing down. Bed alarm on overnight. Pt resting comfortably in bed and call light is within reach. Please refer to patientflowsheets and eMAR for further assessments and documentation.    Rennie Natter, RN

## 2016-10-01 NOTE — Provider Consult (Addendum)
HPB & GI Surgery Consultation Note    Patient: Amanda Galloway  LOS: 2 days  Consulting Attending: Dr. Ron Agee        Consult Question: pancreatic mass, assess for resectability    CC: Abdominal pain    HPI: Amanda Galloway is a 71 y.o. 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 Pt's pain started 3 months ago in April and was diffuse in nature, slowly localizing to the epigastrium with radiation to the right mid back followed by progressive involvement of left mid back over the past 2 weeks. She reports a 16lbs weight loss over the course of 6 months as a result of this pain as well as associated nausea over the past week. She denies a previous history of pancreatitis episodes, jaundice, scleral icterus, changes in stool/urine colors.    Here her pain has been relatively well controlled with a dilaudid PCA although she remains restless and fatigued. Prior to the debilitating pain, she was previously working full time and was relatively active with going to the gym and exercising. She had no previous major hospitalizations/illnesses.    Patient has undergone an exhaustive evaluation in [place] with upper/lower endoscopies as well as multiple different imaging modalities. She was tried on carafate for a small hiatal hernia and mild gastric inflammation without any effect. She presented to the ED a few times due to pain and was managed with given pain medications. She had a HIDA scan that demonstrated decreased biliary ejection fraction and thus underwent a laparoscopic cholecystectomy on 09/06/16, although she attributes the surgery to inflammation of her gallbladder. She reports transient improvement of pain for a few days but again worsened progressively. She again returned to the ED at OSH on 7/16 where a CT scan was done demonstrating a 28m hypoattentuating focus in the pancreatic uncinate process  but otherwise negative so she was discharged home. She returned 1 week later on 7/23 and was admitted for further work up and pain management. She underwent an [place]on 7/24 which showed possible mass/adenopathy posterior to the pancreas. Follow up MRCP on 7/24 demonstrated 2x2cm soft tissue mass-like abnormality in the celiac region, contiguous with the medial margin of the uncinate process of the pancreas concerning for pancreatic lession vs lymphadenopathy. Her LFTs were normal and CA19-9 was 883. She underwent an EUS/FNA on 7/26 which demonstrated a mass in the pancreas and lymph node pressing the celiac. Biopsy results are currently unavailable. [facility]      Past Medical History:   Past Medical History:   Diagnosis Date    Cancer     Fibromyalgia     Hypothyroidism        Past Surgical History:   Past Surgical History:   Procedure Laterality Date    CHOLECYSTECTOMY, LAPAROSCOPIC  09/06/2016    HYSTERECTOMY  08/1986    Fibroids    KNEE SURGERY Right     ROTATOR CUFF REPAIR Right     TONSILLECTOMY AND ADENOIDECTOMY         Medications:  Current Facility-Administered Medications   Medication    senna (SENOKOT) tablet 2 tablet    polyethylene glycol (GLYCOLAX,MIRALAX) powder 17 g    dexamethasone (DECADRON) tablet 4 mg    sodium chloride 0.9 % IV    pantoprazole (PROTONIX) EC tablet 40 mg    ondansetron (ZOFRAN) injection 4 mg  metoclopramide (REGLAN) injection 5 mg    aspirin chewable tablet 81 mg    enoxaparin (LOVENOX) injection 40 mg    Multiple Vitamin (CENTRUM SILVER) w minerals +trac elem tablet    acetaminophen (TYLENOL) tablet 650 mg    docusate sodium (COLACE) capsule 200 mg    pregabalin (LYRICA) capsule 50 mg    triamterene-hydrochlorothiazide (MAXZIDE-25) 37.5-25 MG per tablet 1 tablet    potassium chloride SA (KLOR-CON M10)  tablet 40 mEq    levothyroxine (SYNTHROID, LEVOTHROID) tablet 137 mcg    HYDROmorphone (DILAUDID) PCA 1  mg/mL       Allergies:   No Known Allergies (drug, envir, food or latex)    Family History:   family history includes Breast cancer in her mother; Cancer in her father; Diabetes in her sister; Heart Disease in her father; Obesity in her sister.    Social History:   Social History   Substance Use Topics    Smoking status: Never Smoker    Smokeless tobacco: Never Used    Alcohol use No        Review of Systems:  Constitutional: Negative for fevers, malaise, positive unintentional weight loss  HEENT: Negative for change in vision, mouth sores, nasal congestion.  Cardiac: Negative for chest pain, heart disease.  Respiratory: Negative for shortness of breath, chronic cough, wheezing.  Gastrointestinal: As above.  Genitourinary: Negative for dysuria, change in urinary frequency, hematuria.  Musculoskeletal: Negative for joint pain, back pain, muscle pain.  Skin: Negative for skin rashes, skin lesions.  Neurologic: Positive for headaches, negative seizures.  Psychological: Negative for depression, anxiety.  Hematologic: Negative for easy bleeding, swollen lymph node(s), unusual bruising.  Allergic/Immunologic: Allergies as above.    Physical Examination:  Vitals:  Temp:  [36.3 C (97.3 F)-37.2 C (99 F)] 36.7 C (98.1 F)  Heart Rate:  [69-81] 74  Resp:  [12-18] 18  BP: (118-134)/(52-70) 118/62  Height: 170.2 cm (5' 7.01") Weight: 67.1 kg (148 lb)  General appearance: alert, cooperative and no distress, no jaundice  HEENT: normocephalic, atraumatic, face symmetric, no scleral icterus  Lungs: nonlabored respirations on NC O2, CTAB, no wheezing  Heart: Regular rate and rhythm  Abdomen: soft, nondistended, nontender, no rebound or guarding. Laparoscopic port incisions well healed  Extremities: atraumatic, wwp, no c/c/e  Neuro: alert, oriented, no focal deficits   Vascular: palpable pulses throughout  Psych: appropriate affect      Lab Results:  Laboratory values:   Recent Labs      10/01/16   0428   WBC  9.5   Hemoglobin   11.2   Hematocrit  35   Platelets  433*     No components found with this basename: APTT, PT Recent Labs      10/01/16   0428   Sodium  138   Potassium  4.7   Chloride  98   CO2  25   UN  6   Creatinine  0.64   Glucose  137*   Calcium  9.3   Magnesium  1.3   Phosphorus  3.6    Recent Labs      10/01/16   0428   AST  36*   ALT  31   Alk Phos  81   Bilirubin,Total  0.2   Total Protein  6.0*   Albumin  3.7   Prealbumin  23      Imaging:   Ct Angio Chest    Result Date: 09/30/2016  The examination is  satisfactory for evaluation of the pulmonary arteries. 1. The exam is negative for pulmonary embolism. No evidence for right ventricular strain. 2. Dependent groundglass opacities in the left upper and lower lobes likely represent sequela of aspiration. The upper esophagus is mildly dilated, this theoretically increases the risk of aspiration. 3. Small left and trace right pleural fluid with associated atelectasis. 4. Nonspecific 3 mm nodules in the right upper and middle lobe, recommend attention on follow-up imaging. There are also some small left neck base nodes as can be followed on subsequent imaging. No suspicious mediastinal adenopathy. 5. Other findings include: Right atrial enlargement, nonspecific bilateral lower lobar bronchial dilatation. END OF IMPRESSION I have personally reviewed the image(s) and the resident's interpretation and agree with or edited the findings. [facility] submits this DICOM format image data and final report to the [facility], an independent secure electronic health information exchange, on a reciprocally searchable basis (with patient authorization) for a minimum of 12 months after exam date.     Ct Abdomen And Pelvis Without And With Contrast    Result Date: 10/01/2016  Ill-defined prominent retroperitoneal soft tissue with central low attenuation suggestive of necrosis which abuts the SMA and is adjacent to the pancreatic uncinate process similar to prior MRI. Differential includes  pancreatic neoplasm versus lymphadenopathy. Tissue sampling is recommend for further evaluation. END OF IMPRESSION I have personally reviewed the image(s) and the resident's interpretation and agree with or edited the findings. [facility] submits this DICOM format image data and final report to the [facility], an independent secure electronic health information exchange, on a reciprocally searchable basis (with patient authorization) for a minimum of 12 months after exam date.             CT A/P 7/16    curererererererererrerer    MRI 7/24       ASSESSMENT & RECOMMENDATIONS  Amanda Galloway is a 71 y.o. 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 Her CA19-9 is 883 although EUS/FNA biopsies from OSH are currently still pending.    Her images from multiple scans currently represent a locally advanced, unresectable vs borderline resectable tumor with loss of flat plane between the SMA and the mass.       Will follow up results of OSH FNA biopsy results, if inconclusive, will need repeat EUS/FNA biopsy. If positive for carcinoma, pt will need port placement for neoadjuvant chemotherapy +/- radiation.   Agree with Palliative Care consult for pain management, pt may benefit from celiac plexus block with either GI or APS.   Recommend nutrition consult given pt's recent weight loss.   Pt discussed and seen with Dr. Ron Agee.      Author: Sheila Oats He, MD as of: 10/01/2016  at: 10:36 PM        HPB-GI Attending Addendum:   I personally examined the patient, reviewed the notes, and discussed the plan of care with the residents and patient. Agree with detailed resident's note. Please see the note above for details of history, exam, labs, assessment/plan which reflect my input.     Patient seen and examined on 10/01/16 at 6 pm. Note delayed. Family present.     71 year old  female with pancreatic mass of the uncinate process. She had continued abdominal and back pain for several months. Several studies were performed which initially did not show any pancreatic  lesions. During evaluation a HIDA scan was performed which showed a depressed ejection fraction and cholecystectomy was performed. She initially did well but developed recurrent pain within a few days. further evaluation was then performed with CT scan which showed fullness of the uncinate and later MRI showed a pancreas mass within the uncinate process. Imaging noting relatively small uncinate mass with stranding and circumferential soft tissue involvement surrounding the superior mesenteric artery and celiac axis.  Endoscopic ultrasound was performed with biopsy which is pending. She has continued pain which prevents her from eating. Also with nausea over the last week. No vomiting. Denies diarrhea.     No family history of pancreas cancer. Twenty pound weight loss due to anorexia and early satiety related to abdominal pain.     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 Specifically the circumferential involvement of the superior mesenteric artery. I advised her that there is thankfully no evidence of metastasis at this time. Treatment would involve neoadjuvant chemotherapy with either FOLFIRINOX or Gemcitabine-Abraxane regimen and radiation. If there is regression or stability of the extent of the involvement, surgery would be considered. I explained that this is the most aggressive and beneficial therapy. Surgery at this time would not be beneficial should the tumor not be removed entirely.     I advised that her biopsy may return as negative however we are concerned this is malignancy and she would require repeat endoscopy ultrasound with FNA.     Steps prior to moving forward with chemotherapy would  be surgical placement of a Mediport and diagnostic laparoscopy to evaluate for possible microscopic metastasis.     I discussed pain control options and importance of nutritional intake. Nutrition consultation is recommended.     I provided emotional support as appropriate for this difficult news. Questions were answered.     Delano Metz, MD, FACS  Hepatobiliary, Pancreas & GI Surgery

## 2016-10-01 NOTE — Interdisciplinary Rounds (Addendum)
Interdisciplinary Rounds Note    Date: 10/01/2016   Time: 2:55 PM   Attendance:  Attending, NP/PA, Pharmacy and Care Coordinator    Admit Date/Time:  09/29/2016  4:03 PM    Principal Problem: <principal problem not specified>  Problem List:   Patient Active Problem List    Diagnosis Date Noted    Cancer associated pain 09/29/2016       The patient's problem list and interdisciplinary care plan was reviewed.    Discharge Planning  Lives in: Single-level apartment  Location of bedroom: 1st level  Location of bathroom: 1st level  Lives With: Alone  Can they assist with pt needs after discharge?: Not applicable  *Does patient currently have home care services?: No     *Current External Services: None                      Plan  7/30: CT scan results pending. Surgical Onc consulted, patient will need chemo first  8/1: Celiac block done today. Port placement Thursday and then to start chemotherapy.   8/6 PCA continues, Methadone started yesterday, speech and swallow eval today  8/9 Acute pain service consult-IT pain meds vs additional block, palliative following-PCA remains for now  Anticipated Discharge Date:     Discharge Disposition: TBD

## 2016-10-01 NOTE — Progress Notes (Signed)
Nutrition Progress Note      O:  Height: 170.2 cm (5\' 7" )  Weight: 66.8 kg (147 lb 4.3 oz)  Weight Loss: 9-12% x 4 months  BMI (Calculated): 23.2  Energy Intake: <75% EEE > 1 month     Muscle Mass: Mild       Edema w/ HypoAlbuminemia:                Registered Dietitian Assessment: Upon nutritional assessment, patient meets criteria for: moderate malnutrition     Please refer to nutrition assessment dated 7/30 for detailed assessment/recommendation.     Present on Admission: Yes        Plan/Recommendations:  Ordered by RD:  1. Ensure Enlive QID  2. 30 mL ProSource BID   3. Weight's qMonday    Additional Recommendations (to be ordered by primary team):  1. Continue regular diet- encourage intake  2. Daily multivitamin with minerals (minimal variety in diet for years)  3. Encourage ambulation as tolerated  4. Monitoring: I/O's, % meals completed in flowsheets, labs per team.     Ammie Ferrier RD, [place] [phone]       Attention to provider:   By co-signing this note, I agree with the diagnosis above.  Please document the malnutrition diagnosis in the patients progress note.

## 2016-10-01 NOTE — Progress Notes (Signed)
Contacts: Patient and/or Family    Intervention:     SW provided family with voucher for [employer] parking passes.  SW explained that this voucher needs to be redeemed at parking office on the ground floor of the garage. Patient/family denied additional SW needs at this time.    Plan:   SW to continue to follow and remain available to assist as needed.    Pati Gallo LMSW  5:05 PM  10/01/2016  [place]  Ph- [phone]  Pager- [phone]

## 2016-10-01 NOTE — Progress Notes (Addendum)
Hematology/Oncology Progress Note:      LOS: 2 days     HPI: [name] who was a transfer from OSH on 09/29/16 for workup for a pancreatic mass and pain control.    Interval Hx/Subjective:    - VSS, afebrile  - pain is better controlled, feels the PCA is helping  - patient continues to be weak, and feels that she tired  - family at bedside remains hopeful that chemotx will help   - discussed HPB surgery consult   - no BM as of yet, discussed increasing bowel regimen       Review of Systems   Constitutional: Positive for malaise/fatigue. Negative for chills, fever and weight loss.   Respiratory: Positive for shortness of breath. Negative for cough.    Cardiovascular: Negative for chest pain and leg swelling.   Gastrointestinal: Positive for abdominal pain, constipation and nausea. Negative for diarrhea and vomiting.   Musculoskeletal: Positive for back pain.   Neurological: Positive for weakness.     Objective:     Blood pressure 128/52, pulse 70, temperature 36.3 C (97.3 F), resp. rate 14, weight 67.1 kg (148 lb), SpO2 96 %.    Physical Exam  Gen: Pale and appears fatigued, appears weakj  CV: RRR  Chest: CTA B/L- wearing NC  Abd: hypoactive BS, soft and nontender, ND  Ext: WWP, +pedal pulses, no edema  Neuro: AAOx3, CNs grossly intact, no focal deficits    Assessment/Plan:     Patient is a 71 y.o.  female who has a newly found pancreatic mass and was transferred to [facility] from an OSH on 09/29/2016 for further workup and pain control. HPB consult pending.     New pancreatic head mass  - has not established care with an oncologist yet (likely will be Dr. Sharlyn Galloway)  - has not received treatment yet  - biopsy at [facility] is pending- need to follow up on results  - obtaing staging Ct abd/pelvis:  Ill defined tissue which abuts the SMA  - need to obtain imaging from [facility]  - HPB consult pending ( likely need chemotx before surgery will be considered)   - considering Neo-adjuvant therapy   - CA 19-9 at [facility] was  883  - CBC, CMP     Pain  - dilaudid PCA  - consider celiac plexus block  - tylenol prn  - palliative care consult pending    Hypoxia  - did not get any workup at OSH  - CT angio- negative for PE, possible signs of aspiration  - incentive spirometry    HTN/HLD  - cont home ASA  - cont maxzide (pt takes this for edema - has been on this long term)    Hypothyroidism  - cont synthroid    Constipation/PUD ppx/Nausea  - Colace BID  - senna BID  - Miralax BID  - lactulose x3  - Zofran PRN  - protonix daily  - reglan prn    DVT/PPX:   - Lovenox SQ     FEN:  - PO, MIVF @100mL /hr  - Labs daily, replete PRN  - Regular Diet    PT consult RTPL w/ home PT  Full code    DISPO: Pending above  - Follow up with: needs to be set up with GI oncologist  - Tentative Discharge Date: unknown  - Rx/Equipment Needed: TBD    Author: Hurman Horn, NP  as of: 10/01/2016  at: 6:28 AM  Patient seen and examined. See note by Hurman Horn, NP.  71 yo healthy F with recent diagnosis of pancreatic uncinate mass, highly suspicious for adenocarcinoma (7/26 EUS FNA pending at OSH, CA 19-9 elevated).    Await HPB Surgery input regarding resectability.  Appreciate Palliative Care assistance with pain control.  IR consult tomorrow for possible celiac block.      Amanda Galloway, M.D.  Medical Oncology

## 2016-10-01 NOTE — Plan of Care (Signed)
Cognitive function     Cognitive function will be maintained or return to baseline Maintaining        Mobility     Functional status is maintained or improved - Geriatric Maintaining        Psychosocial     Demonstrates ability to cope with illness Maintaining        Safety     Patient will remain free of falls Maintaining     Prevent any intentional injury Maintaining          Nutrition     Nutritional status is maintained or improved - Geriatric Progressing towards goal        Pain/Comfort     Patient's pain or discomfort is manageable Progressing towards goal          Assumed care of patient from 0700 to 1900 . VSS throughout shift. Patient noting that her pain is between a 3-5 throughout the day. Pt up with writer, and walked around the unit on lap, on room air saturating 94%-95%. Pt up with assistance to the bathroom by staff and family.  Pt has call bell within reach. Bed in the lowest position. Making needs known to Probation officer. Will continue to monitor and assess.  Please refer to patient flowsheets and eMAR  for further assessments and documentation.    Hortencia Conradi, RN

## 2016-10-01 NOTE — Progress Notes (Signed)
Palliative Care Consult / History and Physical    Consult Requested by: Dr. Vincenza Hews    Consult Reason: pain/symptoms    History of Present Illness:[name]  w h/o fibromyalgia, hypothyroidism who was a transfer from OSH on 09/29/16 for workup for a pancreatic mass and pain control. The pain began at the beginning of April.      She originally saw a GI specialist who felt it could be constipation. Endoscopy  Revealed a small hiatal hernia and mild gastric inflammation.  Carafate was started without effect. She went to the ED and an abdominal ultrasound was performed with gallbladder inflammation noted. She had a cholecystectomy about 1 month ago by a Education officer, environmental.  There was a brief resolution in her symptoms but only for a few days. When the pain returned it was treated as post operative pain/muscoskeletal pain. A work up was performed for but failed to demonstrate choledocholithiasis.     When her pain became severe, she went to an ED in [place], where CT a/p was negative 2 weeks ago and she was sent home.  She returned 1 week later and was admitted for a work up and pain management. An MRCP revealed a mass in head of the pancreas and a LN in the celiac plexus. ERCP was performed, biopsy pending.    A PCA was initiated but only with prn dosing that helped but did not alleviate the pain.  Per pt's daughter they did not feel comfortable beginning a continuous infusion due to a new oxygen requirement.  She has never previously required oxygen and this was felt to be due to the cholecystectomy a few weeks ago.  She did not receive workup at OSH for hypoxia.  She has no hx of DVT and denies cough or hemoptysis but occasionally has SOB with activity.  She denies any lower extremity edema or pain.  She was transferred to [facility] for further workup of what is likely primary pancreatic cancer.  She was told the mass is "on a nerve" which is why she may be in so much pain.      What bothers you the  most? Devanie sleepy and interview short. Spoke with daughter. Daughter [name] conveyed the frustration with finding cause of abdominal pain and inability to control the pain.  What helps you cope? [name] mentions that her mom is a Engineer, manufacturing. [name] traveled to [place] and helped orchestrate her mother's transfer to [place] for further work up.      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 She is divorced and has supportive children. Her daughter Rod Mae (HCP) is a Marine scientist w [employer]. Her son Mali and his wife are teachers and are here from  out of state.     Spiritual Screen:  1. Are there any particular cultural beliefs or practices that are important for [name] to know as we take care of you? Her daughter mentions she is a Engineer, manufacturing.     2. Are there any particular spiritual or religious beliefs or practices that are important for [name] to know as we take care of you? Reianna was extremely fatigued and her interview was therefore short.    3. Would you and your family appreciate a visit from an interfaith chaplain?  They are available 24/7. Daughter deferred @ the current time.      Allergies:   No Known Allergies (drug, envir, food or latex)    Active Medications:   lactulose  20 g Oral TID    PROSOURCE NO CARB  30 mL Oral BID WC    senna  2 tablet Oral 2 times per day    polyethylene glycol  17 g Oral 2 times per day    dexamethasone  4 mg Oral BID    pantoprazole  40  mg Oral QAM    aspirin  81 mg Oral Daily    enoxaparin  40 mg Subcutaneous Daily    Vitamins - senior  1 tablet Oral Daily    docusate sodium  200 mg Oral 2 times per day    pregabalin  50 mg Oral 2 times per day    triamterene-hydrochlorothiazide  1 tablet Oral Daily    potassium chloride SA  40 mEq Oral Daily    levothyroxine  137 mcg Oral Daily     Continuous Infusions:   sodium chloride 100 mL/hr (10/01/16 1149)    HYDROmorphone       PRN Meds:  ondansetron, metoclopramide IV, acetaminophen    Palliative Care Review of Systems:  ROS unobtainable/patient unresponsive no   Pain Severity: Moderate 7-8    Location: describes as band like across upper abdomen.   Quality: describes as constant throb   Aggravating factors: not able to say.   Alleviating factors: dilaudid PCA with basal rate is helping pain control down to 3-5.   Related symptoms: nausea, h/a, fatigue, bloating.  Shortness of breath None currently  Anxiety Unknown  Depression Unknown  Drowsiness: Moderate limiting interview.  Nausea: and vomiting , improved with pain control  Loss of appetite: Mild  Constipation: no BM in 9 days, + flatus    Tiredness /Weakness: yes attributed to poor sleep with lack of pain control.    Visual changes  no    Palliative Care Performance Status Scale:  Scale = 70  Decreased activity/cares for self/normal intake/alert    Patient Capacity:  Full    Current Therapies:  No ventilator, dialysis, feeding tube, or TPN.    Physical Examination:  Vitals:    10/01/16 1657   BP:    Pulse:    Resp: 18   Temp:    Weight:    Height:        General appearance: Sleepy and dozing intermittently.  Lungs: clear to auscultation bilaterally  Heart: regular rate and rhythm, S1, S2 normal, no murmur, click, rub or gallop and regular rate and rhythm  Abdomen: Abd softly distended, mild tender upper quadrants bilateral. + BS.Lap sites benign and heeling well.   Extremities: No edema, palplable pulses  Skin: Warm, dry, w/o rashes, or  bruising.  Neurologic: Oriented x 3 but dozing easily. Follows commands.     Lab Results:   All labs in  the last 24 hours:   Recent Results (from the past 24 hour(s))   Phosphorus    Collection Time: 10/01/16  4:28 AM   Result Value Ref Range    Phosphorus 3.6 2.7 - 4.5 mg/dL   Magnesium    Collection Time: 10/01/16  4:28 AM   Result Value Ref Range    Magnesium 1.3 1.3 - 2.1 mEq/L   Comprehensive metabolic panel    Collection Time: 10/01/16  4:28 AM   Result Value Ref Range    Sodium 138 133 - 145 mmol/L    Potassium 4.7 3.3 - 5.1 mmol/L    Chloride 98 96 - 108 mmol/L    CO2 25 20 - 28 mmol/L    Anion Gap 15 7 - 16    UN 6 6 - 20 mg/dL    Creatinine 0.64 0.51 - 0.95 mg/dL    GFR,Caucasian 91 *    GFR,Black 104 *    Glucose 137 (H) 60 - 99 mg/dL    Calcium 9.3 8.6 - 10.2 mg/dL    Total Protein 6.0 (L) 6.3 - 7.7 g/dL    Albumin 3.7 3.5 - 5.2 g/dL    Bilirubin,Total 0.2 0.0 - 1.2 mg/dL    AST 36 (H) 0 - 35 U/L    ALT 31 0 - 35 U/L    Alk Phos 81 35 - 105 U/L   CBC and differential    Collection Time: 10/01/16  4:28 AM   Result Value Ref Range    WBC 9.5 4.0 - 10.0 THOU/uL    RBC 3.6 (L) 3.9 - 5.2 MIL/uL    Hemoglobin 11.2 11.2 - 15.7 g/dL    Hematocrit 35 34 - 45 %    MCV 98 (H) 79 - 95 fL    MCH 32 26 - 32 pg/cell    MCHC 32 32 - 36 g/dL    RDW 13.0 11.7 - 14.4 %    Platelets 433 (H) 160 - 370 THOU/uL    Seg Neut % 90.5 %    Lymphocyte % 6.9 %    Monocyte % 1.8 %    Eosinophil % 0.1 %    Basophil % 0.4 %    Neut # K/uL 8.6 (H) 1.6 - 6.1 THOU/uL    Lymph # K/uL 0.7 (L) 1.2 - 3.7 THOU/uL    Mono # K/uL 0.2 0.2 - 0.9 THOU/uL    Eos # K/uL 0.0 0.0 - 0.4 THOU/uL    Baso # K/uL 0.0 0.0 - 0.1 THOU/uL    Nucl RBC % 0.0 0.0 - 0.2 /100 WBC    Nucl RBC # K/uL 0.0 0.0 - 0.0 THOU/uL    IMM Granulocytes # 0.0 0.0 - 0.1 THOU/uL    IMM Granulocytes 0.3 %   Prealbumin    Collection Time: 10/01/16  4:28 AM   Result Value Ref Range    Prealbumin 23 20 - 40 mg/dL       Radiology Impressions:    09/29/16- CT angio - negative for PE,   Right trace pleural effusion, r upper and middle lobe  3 mm nodule.  09/29/16- CT abd/pelvis  Ill-defined prominent retroperitoneal soft tissue with central low attenuation suggestive of necrosis which abuts the SMA and is adjacent to the pancreatic uncinate process similar to prior MRI. Differential includes pancreatic neoplasm versus   lymphadenopathy. Tissue sampling is recommend for further evaluation.      Assessment/Plan:  [name]  w hypothyroidism, fibromyalgia, s/p cholecystectomy who was a transfer from OSH on 09/29/16 for workup for a pancreatic mass and pain control. Palliative care consult called by primary team to address pain controol.     Pain  - currently on dilaudid PCA 0.5 mg continuous, w 0.5 mg q 30 minutes. Calculation of dilaudid used over the past 23 hrs is 21.24 mg. With some improvement in pain control recommend continuation of current dose. Would not recommend transition to oral route at this time.   - family mentions that they are pressing her PCA demand button with patient direction only. Instructed family that patient PCA should not be pressed without patient instruction.   -encourage tylenol 650 mg q 6 hrs  - continue decadron 4 mg bid as likely beneficial to reduce inflammation and visceral pain at this time.  - Family and patient very interested in celiac plexus block. Palliative care team recommend consult for evaluation of a celiac block.   - note on Lyrica will d/w patient tomorrow if this is a chronic medication for her fibromyalgia.     Constipation/Elimination  - continue miralax,senna  - lactulose may be contributing to gas/bloating. Consider use of bisacodyl suppository and increased ambulation.       Nausea  -continue reglan, zofran prn  -consider holding off on MVI when nausea present.    Family Meeting Scheduled: No      Was goals of care or advance care planning discussed? No, consult for symptoms only Will follow up with patient tomorrow.     [name]  Palliative care service

## 2016-10-01 NOTE — Progress Notes (Cosign Needed)
10/01/16 1145   Mobility   Head of Bed Elevated  Self regulated   Repositioned Turns self   Activity Dangle;Stand at bedside;Out of Bed;Bathroom privileges;Ambulate in hall;In Bed   Level of Assistance 1 person assist;Contact guard assist, steading assist   Range of Motion AROM;All extremities   Heels Elevated No   Transport Method Ambulatory   Assistive Device Front wheel walker   Distance Ambulated (ft) 350 ft   Ambulation Response Tolerated well

## 2016-10-02 DIAGNOSIS — F5000 Anorexia nervosa, unspecified: Secondary | ICD-10-CM

## 2016-10-02 LAB — CBC AND DIFFERENTIAL
Baso # K/uL: 0 10*3/uL (ref 0.0–0.1)
Basophil %: 0.3 %
Eos # K/uL: 0 10*3/uL (ref 0.0–0.4)
Eosinophil %: 0.3 %
Hematocrit: 33 % — ABNORMAL LOW (ref 34–45)
Hemoglobin: 10.4 g/dL — ABNORMAL LOW (ref 11.2–15.7)
IMM Granulocytes #: 0 10*3/uL (ref 0.0–0.1)
IMM Granulocytes: 0.6 %
Lymph # K/uL: 1.2 10*3/uL (ref 1.2–3.7)
Lymphocyte %: 17.5 %
MCH: 32 pg/cell (ref 26–32)
MCHC: 32 g/dL (ref 32–36)
MCV: 99 fL — ABNORMAL HIGH (ref 79–95)
Mono # K/uL: 0.5 10*3/uL (ref 0.2–0.9)
Monocyte %: 7.1 %
Neut # K/uL: 4.9 10*3/uL (ref 1.6–6.1)
Nucl RBC # K/uL: 0 10*3/uL (ref 0.0–0.0)
Nucl RBC %: 0 /100 WBC (ref 0.0–0.2)
Platelets: 395 10*3/uL — ABNORMAL HIGH (ref 160–370)
RBC: 3.3 MIL/uL — ABNORMAL LOW (ref 3.9–5.2)
RDW: 13.1 % (ref 11.7–14.4)
Seg Neut %: 74.2 %
WBC: 6.6 10*3/uL (ref 4.0–10.0)

## 2016-10-02 LAB — COMPREHENSIVE METABOLIC PANEL
ALT: 34 U/L (ref 0–35)
AST: 32 U/L (ref 0–35)
Albumin: 3.6 g/dL (ref 3.5–5.2)
Alk Phos: 75 U/L (ref 35–105)
Anion Gap: 15 (ref 7–16)
Bilirubin,Total: <0.2 mg/dL (ref 0.0–1.2)
CO2: 25 mmol/L (ref 20–28)
Calcium: 9.4 mg/dL (ref 8.6–10.2)
Chloride: 98 mmol/L (ref 96–108)
Creatinine: 0.56 mg/dL (ref 0.51–0.95)
GFR,Black: 109 *
GFR,Caucasian: 95 *
Glucose: 128 mg/dL — ABNORMAL HIGH (ref 60–99)
Lab: 6 mg/dL (ref 6–20)
Potassium: 4.9 mmol/L (ref 3.3–5.1)
Sodium: 138 mmol/L (ref 133–145)
Total Protein: 5.9 g/dL — ABNORMAL LOW (ref 6.3–7.7)

## 2016-10-02 LAB — MAGNESIUM: Magnesium: 1.5 mEq/L (ref 1.3–2.1)

## 2016-10-02 LAB — PROTIME-INR
INR: 1 (ref 0.9–1.1)
Protime: 11.4 s (ref 10.0–12.9)

## 2016-10-02 LAB — PHOSPHORUS: Phosphorus: 3.9 mg/dL (ref 2.7–4.5)

## 2016-10-02 LAB — CA 19 9 (EFF. 01-2011): CA 19 9 (eff. 01-2011): 384 U/mL — ABNORMAL HIGH (ref 0–35)

## 2016-10-02 MED ORDER — ENOXAPARIN SODIUM 40 MG/0.4ML IJ SOSY *I*
40.0000 mg | PREFILLED_SYRINGE | Freq: Every day | INTRAMUSCULAR | Status: DC
Start: 2016-10-03 — End: 2016-10-04
  Administered 2016-10-03: 40 mg via SUBCUTANEOUS
  Filled 2016-10-02: qty 0.4

## 2016-10-02 NOTE — Progress Notes (Signed)
[facility] is aware of referral and will follow hospital course for homecare services.  Amanda Galloway, HCC/LTC [phone]

## 2016-10-02 NOTE — Invasive Procedure Plan of Care (Signed)
Invasive Procedure Plan of Care (Consent Form 419):   Condition(s) Addressed: Pain due to pancreatic head mass   Person Performing Procedure: MOHAJERI MOGHADDAM, Tyran Huser, and Vascular and Interventional Radiology Attendings, Fellows, Residents and Advanced Practice Providers      Side:    Procedure: Celiac Plexus Block    Special Equipment:    Planned Anesthesia: Pending   Benefits: Symptomatic relief.    Risks: Bleeding, infection, peritonitis, fistula, injury to surrounding organs and structures, and in very rare circumstances death.   Alternatives: Not to perform the procedure   Expected Length of Stay: 0 day(s)   Pt Decision: Agrees to proceed     ----------------------------------------------------------------------------------------------------------------------------------------  Consent:  I hereby give my consent and authorize MOHAJERI MOGHADDAM, [place], and Vascular and Interventional Radiology Attendings, Fellows, Residents and Advanced Practice Providers     (The list of possible assistants, all of whom are privileged to provide surgical services at the hospital, is available)  To treat the following: Pain due to pancreatic head mass  Procedure includes: Celiac Plexus Block   1 The care provider has explained my condition to me, the benefits of having the above treatment procedure, and alternate ways of treating my condition. I understand that no guarantees have been made to me about the result of the treatment. The alternatives to this procedure include: Not to perform the procedure   2 The care provider has discussed with me the reasonably foreseeable risks of the treatment and that there may be undesirable results. The risks that are specifically related to this procedure include: Bleeding, infection, peritonitis, fistula, injury to surrounding organs and structures, and in very rare circumstances death.   3 I understand that during the treatment a condition may be discovered which was not known before  the treatment started. Therefore, I authorize the care provider to perform any additional or different treatment which is thought necessary and available.   4 Any tissue, parts, or substances removed during the procedure may be retained or disposed of in accordance with customary scientific, educational and clinical practice.   5 Vendor information if appropriate:      6 Patient Consent for Medical or Surgical Procedure: I have carefully read and fully understand this informed consent form, and have had sufficient opportunity to discuss my condition and the above procedure(s) with the care provider and his/her associates, and all of my questions have been answered to my satisfaction.    Agrees to proceed       ____________________________________________________   _______________   Signature of Patient  Date/Time     ____________________________________________________   _______________   Signature of Parent or Legal Guardian  (if Patent is unable to sign or is a minor) Date/Time       Complete this section for all OR procedures and all other invasive internal procedures performed in any setting.  7 Consent for Receipt of Tissue(s): Not applicable   8 For those procedures that have the potential for significant blood loss: transfusion is not expected to be needed but may be required and given in an emergency, I consent to the transfusion of blood or blood components that may be necessary before, during or after the procedure. I have been informed that no transfusion is 100% safe, however present testing methods make the risks of infection very small. Risks include infection from viruses, bacteria, or parasites, including but not limited to HIV (the AIDS virus) and hepatitis, as well as fever, chills, allergy, volume overload, or death. I have  discussed possible alternatives with my care provider, including no transfusion, autologous transfusion (donation of my own blood), designated/directed donor transfusion  (collection of blood from donors selected by me) or blood salvage during the procedure. I understand that these alternatives may not be available due to timing or health reasons, and the above risks may still apply.   9 Patient Consent for Blood/Tissue: I have had a chance to discuss the risks, benefits and alternatives regarding transfusion/receipt of tissue (as above) with my healthcare provider. My decision(s) regarding the transfusion of blood or blood components and/or the receipt of tissue are as above. I understand this covers my perioperative/periprocedural (before, during, and after the surgery/procedure) course of treatment.    Patient sign here unless 7 and 8 do not apply.       ____________________________________________________   _______________   Signature of Patient Date/Time     ____________________________________________________   _______________   Signature of Parent or Legal Guardian  (if Patent is unable to sign or is a minor) Date/Time      I have discussed the planned procedure, including the potential for any transfusion of blood products or receipt of tissue as necessary, expected benefits, the potential complications and risks and possible alternatives and their benefits and risks with the patient or the patient's surrogate. In my opinion, the patent or the patient's surrogate understands the proposed procedure, its risks, benefits, and alternatives.    Electronically signed by Christella Noa, MD at 4:44 PM

## 2016-10-02 NOTE — Progress Notes (Signed)
Occupational Therapy Assessment   10/02/16 [place] Visit   Visit (#) of Five 1   Precautions   Precautions used Yes   LDA Observation IV lines;Monitors  (PCA)   Home Living (Prior to Admission)   Prior Living Situation Reported by patient;Reported by family  (Daughter present, and patient to d/c to daughter's home. 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 Patient was very independent prior, driving, working, shops/cooks. Per patient and daughter patient will go to daughters home at d/c at least initially through her treatments. Daughter is an Therapist, sports who worked here many years on this unit.    Pain Assessment   *Is the patient currently in pain? Denies   Additional Comments Commentsshe feels "not with it" due to medications.    Vision    Current Vision Wears corrective lenses   Cognition   Level of Alertness Alert   Orientation A&Ox4   Attention  Appears intact   Following Commands 3 step simple   Awareness Intact   Insight Intact   Safety/Judgement Intact   Problem Solving Intact during self care   Initiation Appears intact   Additional Comments Alert, oriented, pleasant. Daughter feeling she is much improved since admission. Following directions well, aware of safety issues, noting that the walker in her room does not work well in the B/R as it is difficult to make turns with it.    Medication Management   Medication Management System Yes    Perception   Perception No deficit noted   Sensation   Sensation Not tested   UE Assessment   UE Assessment Full AROM RUE;Full AROM LUE   Additional Comments Full AROM in BUE's. 5 strength.   Bed Mobility   Supine to Sit Independent   Sit to Supine Independent   Functional Transfers   Stand pivot transfer Contact Guard  (RW)   Toilet Transfers Contact guard  (RW)   Additional Comments Ambulated to and from B/R with RW, and OT bringing IV pole. Managed walker on B/R floor, but does tend to stick to floor.    Balance   Sitting - Static Independent    Sitting - Dynamic Independent   Standing - Static Contact Guard   Standing - Dynamic Contact Guard   ADL Assessment   Eating Independent   Grooming Supervision  (standby)   Where Grooming Assessed Standing at sink   Assist Needed With: Supervision   Areas Assessed: Washing hands  (wiping down sink)   LE Dressing Set up;Contact guard   Where  LE Dressing Assessed Edge of bed   Assist Needed With: Setup  (standing aspects of LE clothing)   Toileting Contact guard   Where Toileting Assessed Standard toilet   Assist needed with Setup;Supervison/Safety   Activity Tolerance  Endurance Tolerates 30+ min activity without fatigue   Assessment   Assessment Impaired ADL status;Impaired self-care transfers;Impaired instrumental ADL's   Plan   OT Frequency 1-2x/wk   Patient Will Benefit From ADL retraining;Functional transfer training;Endurance training;Patient/Family training;Equipment eval/education;IADL training;Community re-entry   Multidisciplinary Communication   Multidisciplinary Communication (RN, patient and daughter)   Recommendation   OT Discharge Recommendations Prior living environment;Home OT;Intermittent supervision   Additional Comments To daughter's home. Will recommend home OT who could further assess light meal prep, showering. Daughter will decide if OT needed at time of d/c.   Timed Calculations:  Timed Codes:  0  Untimed Codes: 25 minutes of  assessment  Unbilled Time: 0  Total Time:  25  Dorthula Rue, MS Pager # [phone]  OT Evaluation Complexity    1.  Chart Review          A brief review of Patient's Occupational Profile & Medical History was performed.     2.  Occupational Performance          Assessment of patient occupations reveals deficits including:      a.  ADL - LB dressing, Bathing and Toileting      b. IADL - Medication management, Household management and Meal preparation      c. Rest/Sleep - No        d. Education - N/A      e. Work - Yes      f. Play - N/A      g. Leisure - Yes      h. Social Participation - No      3.  Decision Making          Analysis of data from Problem Based Assessment        A. Some modification needed to complete evaluation.      B. Comorbidities will affect recovery.      C. Single treatment options exist.    4.  Evaluation Complexity is low based on the above.

## 2016-10-02 NOTE — Progress Notes (Addendum)
Palliative Care Progress Note    Objective: Pt less drowsy than yesterday. Sitting in front of lunch tray enjoying harp. Note some of her conversation she is aware of effort to find the "right word". Patient aware of biopsy results, adenocarcinoma and that she will have a port placed and start chemo Thursday/Friday. Plan for celiac block over the next 24-48 hr by IR.      Subjective:  States pain had been better earlier today but episodes of loose bowel movements and feels she "over did her activity". Does state she was able to sleep for 3 hours straight.      Palliative Care ROS:  Denies CP,SOB, nausea, constipation. Pain still 7-8 across upper abdomen "band like" and more sharp.     Physical Examination:   BP: (116-134)/(60-70)   Temp:  [36.4 C (97.5 F)-37.2 C (99 F)]   Temp src: Temporal (07/31 1239)  Heart Rate:  [64-81]   Resp:  [16-20]   SpO2:  [94 %-99 %]   Height:  [170.2 cm (5' 7.01")]   Weight:  [77.1 kg (169 lb 14.4 oz)]   General appearance: alert and oriented, more interactive today.  Lungs: clear to auscultation bilaterally  Heart: S1, S2 normal  Abdomen: Soft, + BS, tender upper abdomen, mild bloating  Extremities: palpable pulses, w/o edema  Skin: pale, dry, + bruising  Neurologic: A+O x 3, interactive and talking about her grand children with upbeat mood. Then somber when talking about her biopsy results and upcoming chemo.    Assessment/Plan:  Patient is a 71 y.o.female w hypothyroidism, fibromyalgia, s/p cholecystectomy who was a transfer from OSH on 09/29/16 for workup for a pancreatic massand pain control. Palliative care consult called by primary team to address pain control. 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 Chart reviewed and note plans for neoadjuvant chemo/port placement likely Thursday.  Oncology team consulted  IR for Celiac plexus block for pain control.    Pain  - currently on dilaudid PCA 0.5 mg continuous, w 0.5 mg q 30 minutes. Calculation of dilaudid used over the past 23  hrs was 22 mg.  LAst 2 pain ratings noted 7-8 in the chart. Spoke with family and patient and in agreement to suggest increase 0.7 mg/hr continuous and 0.7 mg q 30 min prn for breakthrough.   - Await results of celiac block priro to conversion to oral regime.   -encourage tylenol 650 mg q 6 hrs  - continue decadron 4 mg bid as likely beneficial to reduce inflammation and visceral pain at this time.  -  Await block with IR.   - Lyrica started in [facility] prior to transfer to [facility] and was not her home medication. Will need prescription if to continue after discharge.     Constipation/Elimination  - continue colace ,senna with hold parameters for loose stool.   - continue increased ambulation.       Nausea  -continue reglan, zofran prn  -consider holding off on MVI when nausea present.  -encouraged frequent small meals which has helped along with the pain control.      Will continue to follow for emotional support and pain control. Daughter and patient coping well at present time. Islay plans to stay in [place]  for chemotherapy. Son returned to [place] today with his wife.     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  No Known Allergies (drug, envir, food or latex)    Scheduled Meds:    [START ON 10/03/2016] enoxaparin  40 mg Subcutaneous Daily    PROSOURCE NO CARB  30 mL Oral BID WC    senna  2 tablet Oral 2 times per day    polyethylene glycol  17 g Oral 2 times per day    dexamethasone  4 mg Oral BID    pantoprazole  40 mg Oral QAM    aspirin  81 mg Oral Daily    Vitamins - senior  1 tablet Oral Daily    docusate sodium  200 mg Oral 2 times per day    pregabalin  50 mg Oral 2 times per day    triamterene-hydrochlorothiazide  1 tablet Oral Daily    potassium chloride SA  40 mEq Oral Daily    levothyroxine  137 mcg Oral Daily       Continuous Infusions:    sodium chloride 100 mL/hr (10/02/16 0421)     HYDROmorphone         PRN Meds:  ondansetron, metoclopramide IV, acetaminophen

## 2016-10-02 NOTE — Consults (Signed)
Consulted for celiac block procedure for pain control in patient with history of pancreatic mass.     The case was reviewed with Dr. Alcide Clever in Interventional Radiology.     I personally saw the patient this afternoon and we discussed the risks, benefits, and potential outcomes of the procedure. We discussed that by doing this procedure, she may have improved pain for weeks, months or years, however relapse of pain is possible. We may not be able to target the entire region which will depend on the safe window/approach at the time of the procedure. Lastly, if the celiac plexus is not the true source of her pain, there is potential that her symptoms may not improve.     Potential complications of the procedure were discussed and are outlined in the procedure consent.     Patient and daughter (at bedside) were agreeable to the procedure, and we will plan to do this tomorrow in IR. Patient will need to be NPO after midnight.     The above was also discussed with the patient's provider, Hurman Horn NP.     Shon Baton, MD, MPH  PGY-5, Imaging [employer]

## 2016-10-02 NOTE — Plan of Care (Signed)
Problem: Impaired ADL  Goal: Increase ADL Independence    Intervention: ADL Retraining      LTG:  Patient will perform LE dressing with setup, RW assistance in 2-7days.      LTG:  Patient will perform toileting skills with RW assistance in 2-7days.

## 2016-10-02 NOTE — Progress Notes (Addendum)
Hematology/Oncology Progress Note:      LOS: 3 days     HPI: [name] who was a transfer from OSH on 09/29/16 for workup for a pancreatic mass and pain control.    Interval Hx/Subjective:    - VSS, afebrile   - pain is better controlled, in better spirits today   - pt anxious to get treatment started  - discussed biopsy results, and that chemo should be started by the end of the week  - Dr. Ron Agee to place mediport, and IR for celiac block   - BM last evening, d/c lactulose   - family at bedside       Review of Systems   Constitutional: Positive for malaise/fatigue. Negative for chills, fever and weight loss.   Respiratory: Positive for shortness of breath. Negative for cough.    Cardiovascular: Negative for chest pain and leg swelling.   Gastrointestinal: Positive for abdominal pain, constipation and nausea. Negative for diarrhea and vomiting.   Musculoskeletal: Positive for back pain.   Neurological: Positive for weakness.     Objective:     Blood pressure 120/66, pulse 71, temperature 36.4 C (97.5 F), temperature source Temporal, resp. rate 18, height 1.702 m (5' 7.01"), weight 77.1 kg (169 lb 14.4 oz), SpO2 99 %.    Physical Exam  Gen: Pale and appears fatigued, appears weak, NAD  CV: RRR  Chest: CTA B/L- wearing NC  Abd: hypoactive BS, soft and NT, ND  Ext: WWP, +pedal pulses, no edema  Neuro: AAOx3, CNs grossly intact, no focal deficits    Assessment/Plan:     Patient is a 71 y.o.  female who has a newly found pancreatic mass and was transferred to [facility] from an OSH on 09/29/2016 for further workup and pain control. HPB consutled and not a surgical candidate at this time. IR to place celiac block, Dr. Ron Agee to place mediport.     New pancreatic head mass  - has not established care with an oncologist yet (likely will be Dr. Sharlyn Bologna)  - has not received treatment yet  - biopsy at [facility] is in patient hard chart: positive for malignancy-adenocarcinoma   - obtaing staging Ct abd/pelvis:  Ill defined  tissue which abuts the SMA  - HPB consult: follow FNA result, PC for pain mangement and celiac  Block, Mediport will be placed by Dr. Ron Agee on Thursday/friday   - considering Neo-adjuvant therapy   - CA 19-9 at [facility] was 883, inpatient CA-[id number]  - CBC, CMP     Pain  - dilaudid PCA  - tylenol prn  - appreciate PC consult: continue PCA, consider celiac block   - IR to consult on patient for celiac block     Hypoxia  - did not get any workup at OSH  - CT angio- negative for PE, possible signs of aspiration  - incentive spirometry    HTN/HLD  - cont home ASA  - cont maxzide (pt takes this for edema - has been on this long term)    Hypothyroidism  - cont synthroid    Constipation/PUD ppx/Nausea  - Colace BID  - senna BID  - Miralax BID  - lactulose x3  - Zofran PRN  - protonix daily  - reglan prn    DVT/PPX:   - Lovenox SQ     FEN:  - PO, MIVF @100mL /hr  - Labs daily, replete PRN  - Regular Diet    PT consult RTPL w/  home PT  Full code    DISPO: Pending above  - Follow up with: needs to be set up with GI oncologist  - Tentative Discharge Date: unknown  - Rx/Equipment Needed: TBD    Author: Hurman Horn, NP  as of: 10/02/2016  at: 6:45 AM         Patient seen and examined. See note by Hurman Horn, NP.  71 yo healthy F with recent diagnosis of locally advanced pancreatic uncinate adenocarcinoma.    Appreciate Palliative Care, IR and HPB Surgery assistance. Celiac block tomorrow with IR.  Potential port placement and diagnostic lap with Dr. Ron Agee on Thursday.      Mohamedtaki A. Sharlyn Bologna, M.D.  Medical Oncology

## 2016-10-02 NOTE — Invasive Procedure Plan of Care (Signed)
Invasive Procedure Plan of Care (Consent Form 419):   Condition(s) Addressed: Need for moderate sedation during procedure/minor surgery.   Person Performing Procedure: MOHAJERI MOGHADDAM, Starlena Beil   Side: Not applicable    Procedure: Establishment of moderate sedation.   Special Equipment: None   Planned Anesthesia: Moderate Sedation   Benefits: State of altered consciousness created by administration of intravenous medications (medications in an IV).  Patients under moderate sedation will feel sleepy/relaxed, but will respond to commands and may have memory of the procedure.  The purpose of moderate sedation is to make you feel comfortable during your procedure, and can make your procedure easier to perform.     Risks: Excessive sedation, which may require special procedures to keep you safe, including placement of a breathing tube.  Nausea, vomiting, problems with heart rate or blood pressure, breathing difficilties, very rarely death. Occasionally, vivid dreams or agitation requiring additional medication.   Alternatives: Not undergoing procedure, undergoing procedure without sedation.   Expected Length of Stay:  day(s)   Pt Decision: Agrees to proceed     ----------------------------------------------------------------------------------------------------------------------------------------  Consent:  I hereby give my consent and authorize MOHAJERI MOGHADDAM, [place]  (The list of possible assistants, all of whom are privileged to provide surgical services at the hospital, is available)  To treat the following: Need for moderate sedation during procedure/minor surgery.  Procedure includes: Establishment of moderate sedation.  1 The care provider has explained my condition to me, the benefits of having the above treatment procedure, and alternate ways of treating my condition. I understand that no guarantees have been made to me about the result of the treatment. The alternatives to this procedure include: Not  undergoing procedure, undergoing procedure without sedation.   2 The care provider has discussed with me the reasonably foreseeable risks of the treatment and that there may be undesirable results. The risks that are specifically related to this procedure include: Excessive sedation, which may require special procedures to keep you safe, including placement of a breathing tube.  Nausea, vomiting, problems with heart rate or blood pressure, breathing difficilties, very rarely death. Occasionally, vivid dreams or agitation requiring additional medication.   3 I understand that during the treatment a condition may be discovered which was not known before the treatment started. Therefore, I authorize the care provider to perform any additional or different treatment which is thought necessary and available.   4 Any tissue, parts, or substances removed during the procedure may be retained or disposed of in accordance with customary scientific, educational and clinical practice.   5 Vendor information if appropriate: If a vendor representative is expected to be present during my procedure, it has been explained to me that the vendor representative works for (manufacturer of the device to be used) and that his/her role includes . I consent to the vendor representative's presence and involvement as described. If circumstances change and a decision is made during my procedure that a vendor representative's presence is needed, I will be notified of the above after my procedure is completed.  Equipment: None     6 Patient Consent for Medical or Surgical Procedure: I have carefully read and fully understand this informed consent form, and have had sufficient opportunity to discuss my condition and the above procedure(s) with the care provider and his/her associates, and all of my questions have been answered to my satisfaction.    Agrees to proceed       ____________________________________________________   _______________    Signature of Patient  Date/Time     ____________________________________________________   _______________   Signature of Parent or Legal Guardian  (if Patent is unable to sign or is a minor) Date/Time       Complete this section for all OR procedures and all other invasive internal procedures performed in any setting.  7 Consent for Receipt of Tissue(s): Not applicable   8 For those procedures that have the potential for significant blood loss: not applicable.   9 Patient Consent for Blood/Tissue: Not applicable.    Patient sign here unless 7 and 8 do not apply.       ____________________________________________________   _______________   Signature of Patient Date/Time     ____________________________________________________   _______________   Signature of Parent or Legal Guardian  (if Patent is unable to sign or is a minor) Date/Time      I have discussed the planned procedure, including the potential for any transfusion of blood products or receipt of tissue as necessary, expected benefits, the potential complications and risks and possible alternatives and their benefits and risks with the patient or the patient's surrogate. In my opinion, the patent or the patient's surrogate understands the proposed procedure, its risks, benefits, and alternatives.    Electronically signed by Christella Noa, MD at 4:43 PM

## 2016-10-02 NOTE — Plan of Care (Signed)
Safety     Prevent any intentional injury Goal no longer appropriate          Cognitive function     Cognitive function will be maintained or return to baseline Maintaining        Mobility     Functional status is maintained or improved - Geriatric Maintaining        Nutrition     Nutritional status is maintained or improved - Geriatric Maintaining        Psychosocial     Demonstrates ability to cope with illness Maintaining        Safety     Patient will remain free of falls Maintaining          Pain/Comfort     Patient's pain or discomfort is manageable Progressing towards goal        Assumed care of patient 1500-2300. VSS throughout shift. Pt c/o of abd pain - Dilaudid 1:1 PCA dose increased to 0.7mg  PCA dose; 0.7/hr continuous. Pt reports some improvement after the increase. Pt reports diarrhea resolving. Please refer to patientflowsheets and eMAR for further assessments and documentation.

## 2016-10-02 NOTE — Invasive Procedure Plan of Care (Signed)
Invasive Procedure Plan of Care (Consent Form 419):   Condition(s) Addressed: Pancreas cancer     Person Performing Procedure: Nataniel Gasper, and None   Side:    Procedure: Diagnostic laparoscopy (evaluation of the abdominal cavity)  Mediport placement (tunneled vascular access)   Special Equipment:    Planned Anesthesia: General   Benefits: Diagnosis  Line placement for access   Risks: The procedure and its possible complications were reviewed in detail including: infection; bleeding; thromboembolic events; damage to adjacent structures; pneumothorax; and anesthesia.  The post operative hospital course and expected post surgical course were discussed. The patient expressed understanding and wished to proceed.    Alternatives: No surgery   Expected Length of Stay: 0 day(s)   Pt Decision: Agrees to proceed     ----------------------------------------------------------------------------------------------------------------------------------------  Consent:  I hereby give my consent and authorize Branae Crail, and None  (The list of possible assistants, all of whom are privileged to provide surgical services at the hospital, is available)  To treat the following: Pancreas cancer    Procedure includes: Diagnostic laparoscopy (evaluation of the abdominal cavity)  Mediport placement (tunneled vascular access)  1 The care provider has explained my condition to me, the benefits of having the above treatment procedure, and alternate ways of treating my condition. I understand that no guarantees have been made to me about the result of the treatment. The alternatives to this procedure include: No surgery   2 The care provider has discussed with me the reasonably foreseeable risks of the treatment and that there may be undesirable results. The risks that are specifically related to this procedure include: The procedure and its possible complications were reviewed in detail including: infection; bleeding; thromboembolic events;  damage to adjacent structures; pneumothorax; and anesthesia.  The post operative hospital course and expected post surgical course were discussed. The patient expressed understanding and wished to proceed.    3 I understand that during the treatment a condition may be discovered which was not known before the treatment started. Therefore, I authorize the care provider to perform any additional or different treatment which is thought necessary and available.   4 Any tissue, parts, or substances removed during the procedure may be retained or disposed of in accordance with customary scientific, educational and clinical practice.   5 Vendor information if appropriate:      6 Patient Consent for Medical or Surgical Procedure: I have carefully read and fully understand this informed consent form, and have had sufficient opportunity to discuss my condition and the above procedure(s) with the care provider and his/her associates, and all of my questions have been answered to my satisfaction.    Agrees to proceed       ____________________________________________________   _______________   Signature of Patient  Date/Time     ____________________________________________________   _______________   Signature of Parent or Legal Guardian  (if Patent is unable to sign or is a minor) Date/Time       Complete this section for all OR procedures and all other invasive internal procedures performed in any setting.  7 Consent for Receipt of Tissue(s): Not expected to be needed but may be required and given in an emergency   8 For those procedures that have the potential for significant blood loss: I consent to the transfusion of blood or blood components that may be necessary before, during or after the procedure. I have been informed that no transfusion is 100% safe, however present testing methods make the risks  of infection very small. Risks include infection from viruses, bacteria, or parasites, including but not limited to HIV  (the AIDS virus) and hepatitis, as well as fever, chills, allergy, volume overload, or death. I have discussed possible alternatives with my care provider, including no transfusion, autologous transfusion (donation of my own blood), designated/directed donor transfusion (collection of blood from donors selected by me) or blood salvage during the procedure. I understand that these alternatives may not be available due to timing or health reasons, and the above risks may still apply.   9 Patient Consent for Blood/Tissue: I have had a chance to discuss the risks, benefits and alternatives regarding transfusion/receipt of tissue (as above) with my healthcare provider. My decision(s) regarding the transfusion of blood or blood components and/or the receipt of tissue are as above. I understand this covers my perioperative/periprocedural (before, during, and after the surgery/procedure) course of treatment.    Patient sign here unless 7 and 8 do not apply.       ____________________________________________________   _______________   Signature of Patient Date/Time     ____________________________________________________   _______________   Signature of Parent or Legal Guardian  (if Patent is unable to sign or is a minor) Date/Time      I have discussed the planned procedure, including the potential for any transfusion of blood products or receipt of tissue as necessary, expected benefits, the potential complications and risks and possible alternatives and their benefits and risks with the patient or the patient's surrogate. In my opinion, the patent or the patient's surrogate understands the proposed procedure, its risks, benefits, and alternatives.    Electronically signed by Delano Metz, MD at 6:58 PM

## 2016-10-03 ENCOUNTER — Inpatient Hospital Stay: Payer: PRIVATE HEALTH INSURANCE

## 2016-10-03 ENCOUNTER — Other Ambulatory Visit: Payer: PRIVATE HEALTH INSURANCE

## 2016-10-03 DIAGNOSIS — R531 Weakness: Secondary | ICD-10-CM

## 2016-10-03 DIAGNOSIS — Z8507 Personal history of malignant neoplasm of pancreas: Secondary | ICD-10-CM

## 2016-10-03 LAB — CBC AND DIFFERENTIAL
Baso # K/uL: 0 10*3/uL (ref 0.0–0.1)
Basophil %: 0.5 %
Eos # K/uL: 0 10*3/uL (ref 0.0–0.4)
Eosinophil %: 0.2 %
Hematocrit: 36 % (ref 34–45)
Hemoglobin: 11.3 g/dL (ref 11.2–15.7)
IMM Granulocytes #: 0.1 10*3/uL (ref 0.0–0.1)
IMM Granulocytes: 1.2 %
Lymph # K/uL: 1.6 10*3/uL (ref 1.2–3.7)
Lymphocyte %: 19.3 %
MCH: 32 pg/cell (ref 26–32)
MCHC: 31 g/dL — ABNORMAL LOW (ref 32–36)
MCV: 101 fL — ABNORMAL HIGH (ref 79–95)
Mono # K/uL: 0.6 10*3/uL (ref 0.2–0.9)
Monocyte %: 7.7 %
Neut # K/uL: 5.8 10*3/uL (ref 1.6–6.1)
Nucl RBC # K/uL: 0 10*3/uL (ref 0.0–0.0)
Nucl RBC %: 0 /100 WBC (ref 0.0–0.2)
Platelets: 413 10*3/uL — ABNORMAL HIGH (ref 160–370)
RBC: 3.6 MIL/uL — ABNORMAL LOW (ref 3.9–5.2)
RDW: 13.2 % (ref 11.7–14.4)
Seg Neut %: 71.1 %
WBC: 8.2 10*3/uL (ref 4.0–10.0)

## 2016-10-03 LAB — COMPREHENSIVE METABOLIC PANEL
ALT: 31 U/L (ref 0–35)
AST: 26 U/L (ref 0–35)
Albumin: 3.8 g/dL (ref 3.5–5.2)
Alk Phos: 76 U/L (ref 35–105)
Anion Gap: 16 (ref 7–16)
Bilirubin,Total: <0.2 mg/dL (ref 0.0–1.2)
CO2: 24 mmol/L (ref 20–28)
Calcium: 9.7 mg/dL (ref 8.6–10.2)
Chloride: 98 mmol/L (ref 96–108)
Creatinine: 0.69 mg/dL (ref 0.51–0.95)
GFR,Black: 102 *
GFR,Caucasian: 88 *
Glucose: 104 mg/dL — ABNORMAL HIGH (ref 60–99)
Lab: 12 mg/dL (ref 6–20)
Potassium: 4.7 mmol/L (ref 3.3–5.1)
Sodium: 138 mmol/L (ref 133–145)
Total Protein: 6.4 g/dL (ref 6.3–7.7)

## 2016-10-03 LAB — MAGNESIUM: Magnesium: 1.5 mEq/L (ref 1.3–2.1)

## 2016-10-03 LAB — PHOSPHORUS: Phosphorus: 4.5 mg/dL (ref 2.7–4.5)

## 2016-10-03 MED ORDER — FENTANYL CITRATE 50 MCG/ML IJ SOLN *WRAPPED*
INTRAMUSCULAR | Status: AC
Start: 2016-10-03 — End: 2016-10-03
  Filled 2016-10-03: qty 2

## 2016-10-03 MED ORDER — MIDAZOLAM HCL 1 MG/ML IJ SOLN *I* WRAPPED
INTRAMUSCULAR | Status: AC
Start: 2016-10-03 — End: 2016-10-03
  Filled 2016-10-03: qty 2

## 2016-10-03 MED ORDER — FENTANYL CITRATE 50 MCG/ML IJ SOLN *WRAPPED*
INTRAMUSCULAR | Status: AC | PRN
Start: 2016-10-03 — End: 2016-10-03
  Administered 2016-10-03 (×4): 50 ug via INTRAVENOUS

## 2016-10-03 MED ORDER — ALCOHOL 98 % IV SOLN *I*
20.0000 mL | Freq: Once | INTRAMUSCULAR | Status: AC
Start: 2016-10-03 — End: 2016-10-03
  Administered 2016-10-03: 20 mL
  Filled 2016-10-03 (×2): qty 20

## 2016-10-03 MED ORDER — MIDAZOLAM HCL 1 MG/ML IJ SOLN *I* WRAPPED
INTRAMUSCULAR | Status: AC | PRN
Start: 2016-10-03 — End: 2016-10-03
  Administered 2016-10-03: 1 mg via INTRAVENOUS

## 2016-10-03 MED ORDER — FENTANYL CITRATE 50 MCG/ML IJ SOLN *WRAPPED*
INTRAMUSCULAR | Status: AC | PRN
Start: 2016-10-03 — End: 2016-10-03
  Administered 2016-10-03: 50 ug via INTRAVENOUS

## 2016-10-03 MED ORDER — MIDAZOLAM HCL 1 MG/ML IJ SOLN *I* WRAPPED
INTRAMUSCULAR | Status: AC | PRN
Start: 2016-10-03 — End: 2016-10-03
  Administered 2016-10-03 (×4): 1 mg via INTRAVENOUS

## 2016-10-03 NOTE — Plan of Care (Signed)
Problem: Safety  Goal: Patient will remain free of falls  Outcome: Maintaining  Patient educated to call for assistance when ambulating.  Bed alarm on for patient safety when family not present at bedside.      Problem: Pain/Comfort  Goal: Patient's pain or discomfort is manageable  Outcome: Progressing towards goal  Patient on PCA for pain control

## 2016-10-03 NOTE — Progress Notes (Signed)
Assumed care of patient 0700-1900.  VSS throughout shift.  Patient with an increase in abdominal pain post procedure today but improved once PCA was restarted.  Bed alarm on for patient safety when family not present at bedside.  Please refer to patient flowsheets and eMAR  for further assessments and documentation.

## 2016-10-03 NOTE — H&P (View-Only) (Signed)
Invasive Procedure Plan of Care (Consent Form 419):   Condition(s) Addressed: Pancreas cancer     Person Performing Procedure: Beautiful Pensyl, and None   Side:    Procedure: Diagnostic laparoscopy (evaluation of the abdominal cavity)  Mediport placement (tunneled vascular access)   Special Equipment:    Planned Anesthesia: General   Benefits: Diagnosis  Line placement for access   Risks: The procedure and its possible complications were reviewed in detail including: infection; bleeding; thromboembolic events; damage to adjacent structures; pneumothorax; and anesthesia.  The post operative hospital course and expected post surgical course were discussed. The patient expressed understanding and wished to proceed.    Alternatives: No surgery   Expected Length of Stay: 0 day(s)   Pt Decision: Agrees to proceed     ----------------------------------------------------------------------------------------------------------------------------------------  Consent:  I hereby give my consent and authorize Bambi Fehnel, and None  (The list of possible assistants, all of whom are privileged to provide surgical services at the hospital, is available)  To treat the following: Pancreas cancer    Procedure includes: Diagnostic laparoscopy (evaluation of the abdominal cavity)  Mediport placement (tunneled vascular access)  1 The care provider has explained my condition to me, the benefits of having the above treatment procedure, and alternate ways of treating my condition. I understand that no guarantees have been made to me about the result of the treatment. The alternatives to this procedure include: No surgery   2 The care provider has discussed with me the reasonably foreseeable risks of the treatment and that there may be undesirable results. The risks that are specifically related to this procedure include: The procedure and its possible complications were reviewed in detail including: infection; bleeding; thromboembolic events;  damage to adjacent structures; pneumothorax; and anesthesia.  The post operative hospital course and expected post surgical course were discussed. The patient expressed understanding and wished to proceed.    3 I understand that during the treatment a condition may be discovered which was not known before the treatment started. Therefore, I authorize the care provider to perform any additional or different treatment which is thought necessary and available.   4 Any tissue, parts, or substances removed during the procedure may be retained or disposed of in accordance with customary scientific, educational and clinical practice.   5 Vendor information if appropriate:      6 Patient Consent for Medical or Surgical Procedure: I have carefully read and fully understand this informed consent form, and have had sufficient opportunity to discuss my condition and the above procedure(s) with the care provider and his/her associates, and all of my questions have been answered to my satisfaction.    Agrees to proceed       ____________________________________________________   _______________   Signature of Patient  Date/Time     ____________________________________________________   _______________   Signature of Parent or Legal Guardian  (if Patent is unable to sign or is a minor) Date/Time       Complete this section for all OR procedures and all other invasive internal procedures performed in any setting.  7 Consent for Receipt of Tissue(s): Not expected to be needed but may be required and given in an emergency   8 For those procedures that have the potential for significant blood loss: I consent to the transfusion of blood or blood components that may be necessary before, during or after the procedure. I have been informed that no transfusion is 100% safe, however present testing methods make the risks  of infection very small. Risks include infection from viruses, bacteria, or parasites, including but not limited to HIV  (the AIDS virus) and hepatitis, as well as fever, chills, allergy, volume overload, or death. I have discussed possible alternatives with my care provider, including no transfusion, autologous transfusion (donation of my own blood), designated/directed donor transfusion (collection of blood from donors selected by me) or blood salvage during the procedure. I understand that these alternatives may not be available due to timing or health reasons, and the above risks may still apply.   9 Patient Consent for Blood/Tissue: I have had a chance to discuss the risks, benefits and alternatives regarding transfusion/receipt of tissue (as above) with my healthcare provider. My decision(s) regarding the transfusion of blood or blood components and/or the receipt of tissue are as above. I understand this covers my perioperative/periprocedural (before, during, and after the surgery/procedure) course of treatment.    Patient sign here unless 7 and 8 do not apply.       ____________________________________________________   _______________   Signature of Patient Date/Time     ____________________________________________________   _______________   Signature of Parent or Legal Guardian  (if Patent is unable to sign or is a minor) Date/Time      I have discussed the planned procedure, including the potential for any transfusion of blood products or receipt of tissue as necessary, expected benefits, the potential complications and risks and possible alternatives and their benefits and risks with the patient or the patient's surrogate. In my opinion, the patent or the patient's surrogate understands the proposed procedure, its risks, benefits, and alternatives.    Electronically signed by Delano Metz, MD at 6:58 PM

## 2016-10-03 NOTE — Progress Notes (Signed)
Interventional Radiology Pre-Procedure Handoff/Checklist    NPO: [x] YES [] NO [] N/A     Tube feed Stopped: [] YES [] NO [x] N/A     Anticoagulants: LMW heparin - last dose held    Telemetry: [] YES [x] NO [] N/A     Transport mode: Stretcher  Number of Transporters needed: 1        IV access:   Peripheral IV 10/01/16 2246 Right Forearm (middle third) (Active)   Phlebitis Scale Grade 0 10/03/2016  2:00 AM   Infiltration Scale Grade 0 10/03/2016  2:00 AM   Line Status Infusing 10/03/2016  2:00 AM   Dressing Type Transparent 10/03/2016  2:00 AM   Dressing Status Clean, dry and intact 10/03/2016  2:00 AM       Respiratory: Room Air    Consentable:yes    Alert and Oriented to person, place and time?: yes    Code Status:Full     Does patient wear an insulin pump? [] YES [x] NO [] N/A      Precautions:none    Allergies: No Known Allergies (drug, envir, food or latex)    Malachi Pro, RN Received Handoff Report from Hunter, RN for IR procedure 8:52 AM

## 2016-10-03 NOTE — Progress Notes (Addendum)
Hepatobiliary, Pancreas & GI Surgery     71 year old female with locally advanced and unresectable pancreatic cancer.     The outside biopsy results returned with tissue diagnosis of adenocarcinoma.  I again reviewed our previous recommendation for chemotherapy and radiation with surveillance imaging.     The rationale for Mediport and diagnostic laparoscopy to assess for occult microscopic metastasis.     The procedures, risk and post operative course were discussed.     Will plan for surgery tomorrow 10/04/16 in afternoon.     Consent obtained. Daughter present to ask questions.     NPO at midnight. IV fluids at 100 ml/hr  May have clears liquid up to 4 hours prior to surgery. Plan for surgery in early afternoon.       Delano Metz, MD

## 2016-10-03 NOTE — Progress Notes (Signed)
Assumed care of patient 2300-0700. VSS throughout shift. Pt complained of abdominal pain during shift- continuous dilaudid 1:1 PCA managing pain well per pt report since dose increase. 2L NC on overnight- satting approprietly. Bed alarm on overnight, pt called approprietly. Pt resting comfortably in bed and call light is within reach. Please refer to patientflowsheets and eMAR for further assessments and documentation.    Rennie Natter, RN

## 2016-10-03 NOTE — Progress Notes (Addendum)
Hematology/Oncology Progress Note:      LOS: 4 days     HPI: [name] who was a transfer from OSH on 09/29/16 for workup for a pancreatic mass and pain control.    Interval Hx/Subjective:    - VSS, afebrile   - plan for celiac block today  - plan for mediport placement tomorrow and then begin chemo on Friday  - daughter states patient has been up walking a lot, feels better, and has been trying to eat more    Review of Systems   Constitutional: Positive for malaise/fatigue. Negative for chills, fever and weight loss.   Respiratory: Positive for shortness of breath. Negative for cough.    Cardiovascular: Negative for chest pain and leg swelling.   Gastrointestinal: Positive for abdominal pain, constipation and nausea. Negative for diarrhea and vomiting.   Musculoskeletal: Positive for back pain.   Neurological: Positive for weakness.     Objective:     Blood pressure 110/70, pulse 66, temperature 36.3 C (97.3 F), temperature source Temporal, resp. rate 20, height 1.702 m (5' 7.01"), weight 77.1 kg (169 lb 14.4 oz), SpO2 97 %.    Physical Exam  Gen: Pale and appears fatigued, appears weak, NAD  CV: RRR  Chest: CTA B/L- wearing NC  Abd: hypoactive BS, soft and NT, ND  Ext: WWP, +pedal pulses, no edema  Neuro: AAOx3, CNs grossly intact, no focal deficits    Assessment/Plan:     Patient is a 71 y.o.  female who has a newly found pancreatic mass and was transferred to [facility] from an OSH on 09/29/2016 for further workup and pain control. HPB consutled and not a surgical candidate at this time. IR celiac block today 8/1 and Dr. Ron Agee to place mediport 8/2.     New pancreatic head mass  - has not established care with an oncologist yet (likely will be Dr. Sharlyn Bologna)  - has not received treatment yet  - biopsy at [facility] is in patient hard chart: positive for malignancy-adenocarcinoma   - obtaing staging Ct abd/pelvis:  Ill defined tissue which abuts the SMA  - HPB consult: follow FNA result, PC for pain mangement  and celiac  Block, Mediport will be placed by Dr. Ron Agee on Thursday/friday   - plan to start chemo on Friday 8/3  - CA 19-9 at [facility] was 883, inpatient CA-19-9 384    Pain  - dilaudid PCA  - tylenol prn  - appreciate PC consult: increased PCA to 0.7mg /hr and 0.7mg  q7mins  - IR celiac block today 8/1    Hypoxia  - CT angio- negative for PE, possible signs of aspiration  - incentive spirometry    HTN/HLD  - cont home ASA  - cont maxzide (pt takes this for edema - has been on this long term)    Hypothyroidism  - cont synthroid    Constipation/PUD ppx/Nausea  - Colace BID  - senna BID  - Miralax BID  - lactulose x3  - Zofran PRN  - protonix daily  - reglan prn    DVT/PPX:   - Lovenox SQ     FEN:  - PO, MIVF @100mL /hr  - Labs daily, replete PRN  - Regular Diet    PT consult RTPL w/ home PT    Full code    DISPO: Pending above  - Follow up with: needs to be set up with GI oncologist  - Tentative Discharge Date: unknown  - Rx/Equipment Needed: TBD  Author: William Dalton, PA  as of: 10/03/2016  at: 8:19 AM         Patient seen and examined. See note by William Dalton, PA. 71 yo healthy F with recent diagnosis of locally advanced pancreatic uncinate adenocarcinoma.    Appreciate Palliative Care, IR and HPB Surgery assistance. Celiac block today with IR.  Port placement and diagnostic lap with Dr. Ron Agee on Thursday.    Plan for cycle 1 of chemotherapy in-house given rapidly progressive disease.  Discharge home once on oral pain regimen.      Mohamedtaki A. Sharlyn Bologna, M.D.  Medical Oncology

## 2016-10-03 NOTE — Anesthesia Preprocedure Evaluation (Addendum)
Anesthesia Pre-operative History and Physical for Tad Moore    Highlighted Issues for this Procedure:  71 y.o. female with Pancreatic cancer (C25.9) presenting for Procedure(s):  LAPAROSCOPY DIAGNOSTIC; MEDIPORT PLACEMENT by Surgeon(s):  Delano Metz, MD scheduled for 120 minutes.    43F with newly diagnosed pancreatic adenocarcinoma s/p celiac plexus block here for mediport insertion. To begin chemotherapy on 10/05/16. Pain currently being managed with dilaudid PCA.      Marland Kitchen  Anesthesia Evaluation Information Source: records, patient     ANESTHESIA HISTORY    + history of anesthetic complications          PONV    GENERAL  Comment: Pancreatic cancer  Pertinent (-):  No obesity    HEENT     Denies HEENT issues     CARDIOVASCULAR  Good(4+METs) Exercise Tolerance    + Hypertension          well controlled    GI/HEPATIC/RENAL  Last PO Intake: >8hr before procedure and >2hr before procedure (clears)    + GERD          well controlled    + Pancreatic Disease (pancreatic adenocarcinoma with abdominal pain)          acute NEURO/PSYCH    + Chronic pain ( on dilaudid PCA for cancer related adbominal pain)          fibromyalgia  Pertinent(-):  No seizures or cerebrovascular event    ENDO/OTHER    + Thyroid Disease          HYPOthyroid well controlled    HEMATOLOGIC    + Blood dyscrasia          hyperlipidemia       Physical Exam    Airway            Mouth opening: limited            Mallampati: II            TM distance (fb): >3 FB            Neck ROM: full            Airway Impression: easy  Dental   Normal Exam   Cardiovascular  Normal Exam           Rhythm: regular           Rate: normal        General Survey    Normal Exam   Pulmonary   Normal Exam    breath sounds clear to auscultation    Mental Status     oriented to person, place and time         ________________________________________________________________________  PLAN  ASA Score  3  Anesthetic Plan general     Induction (routine IV) General Anesthesia/Sedation  Maintenance Plan (inhaled agents and IV bolus);  Airway Manipulation (direct laryngoscopy); Airway (cuffed ETT); Line ( use current access); Positioning (supine); PONV Plan (dexamethasone, ondansetron and haloperidol); Pain (per surgical team); PostOp (PACU)    Informed Consent     Risks:          Risks discussed were commensurate with the plan listed above with the following specific points: N/V and sore throat  Risks not discussed because of patient declined to hear risks    Anesthetic Consent:         Anesthetic plan (and risks as noted above) were discussed with patient    Plan also discussed with team members including:  CRNA, surgeon and attending    Attending Attestation:  As the primary attending anesthesiologist, I attest that the patient or proxy understands and accepts the risks and benefits of the anesthesia plan. I also attest that I have personally performed a pre-anesthetic examination and evaluation, and prescribed the anesthetic plan for this particular location within 48 hours prior to the anesthetic as documented. Benjamine Mola, MD 5:02 PM

## 2016-10-03 NOTE — Interval H&P Note (Signed)
UPDATES TO PATIENT'S CONDITION on the DAY OF SURGERY/PROCEDURE    I. Updates to Patient's Condition (to be completed by a provider privileged to complete a H&P, following reassessment of the patient by the provider):    Day of Surgery/Procedure Update:  History  History reviewed and no change    Physical  Physical exam updated and no change    Pre-Procedure Assessment  Airway Visibility: Soft palate  Loose/Broken Teeth, Oral Piercings: Not Applicable  Lungs: Clear  Heart sounds rhythm: Regular Rate Rhythm  ASA Physical status rating: Class III: Severe systemic disease       II. Procedure Readiness   I have reviewed the patient's H&P and updated condition. By completing and signing this form, I attest that this patient is ready for surgery/procedure.    III. Attestation   I have reviewed the updated information regarding the patient's condition and it is appropriate to proceed with the planned surgery/procedure.    Alcide Clever, MD as of 10:18 AM 10/03/2016

## 2016-10-03 NOTE — Progress Notes (Signed)
[employer] Nursing Procedure Note    Amanda Galloway  [id number]    Procedure: Celiac block        Status: Completed    Patient tolerated procedure well     Specimen Collection: no    Sponge count: N/A    Fluid Removed:No  Procedure Dressing Site located:Abdomen  Dressing Type:sterile gauze/tegaderm  Biopatch:no  Dressing status:Clean, dry and intact   Hematoma:Not evident  Medication received:Versed 5mg  and Fentanyl 215mcg  Cardiovascular:   Peripheral Pulses: N/A      Fistula: N/A    Neuro Assessment:Patient is at pre-procedure baseline    Implant patient information given to patient or parent/guardian:N/A    Report given to:Unit Nurse. Unit [facility] Name Unit RN      Last Filed Vitals    10/03/16 1114   BP: 109/62   Pulse: 66   Resp: 16   Temp:    SpO2: 97%

## 2016-10-03 NOTE — Progress Notes (Signed)
Amanda Galloway currently requires assist of 1 for mobility. Anticipate patient will need 2-3 more PT visits to allow return to prior living environment. Discharge Recommendations: Anticipate return to prior living arrangement, Home PT after discharge from the hospital.     PT Discharge Equipment Recommended: To be determined     Physical Therapy Treatment Note:     10/03/16 1600   PT Tracking   PT TRACKING PT Assigned   Visit Number   Visit Number [facility]) / Treatment Day (HH) 2   Precautions/Observations   Precautions used Yes   LDA Observation IV lines;PCA   Fall Precautions General falls precautions   Other Patient in bathroom with assist from daughter. Daugther reports patient has been stand by assist with mobility with patient using IV pole for support. Daughter reported patient ambulating downstairs and outside yesterday   Pain Assessment   *Is the patient currently in pain? Yes   Pain (Before,During, After) Therapy During   0-10 Scale (did not quantify; a little)   Pain Location Abdomen   Pain Intervention(s) Refer to nursing for pain management;Repositioned   Cognition   Arousal/Alertness Appropriate responses to stimuli   Following Commands Follows simple commands without difficulty;Follows simple commands consistently   Transfers   Transfers Tested   Sit to Stand Stand by assistance;1 person assist   Stand to sit Stand by assistance;1 person assist   Transfer Assistive Device none   Additional comments Patient benefits from SBAx1 for safety due to impaired balance. Educated patient on continued stand by assist during mobility for safety. Demonstrtaed no initial loss of balance.   Mobility   Mobility Tested   Gait Pattern Unsteady   Ambulation Assist Stand by;1 person assist   Ambulation Distance (Feet) 400   Ambulation Assistive Device Other (comment)  (IV pole)   Additional comments Initially patient uses BUE on IV pole to assist with balance. trialed ambulation with no support, patient was unable to safely  weight and presented with slight loss of balance reuqiring hands on assist. Encouraged patient to use only one UE on IV pole. Patient required SBAx1 and UE support on IV pole. Slight difficulty noted with advancing IV pole while ambulating intermittently bumpng feet into legs. Continues to benefit from SBAx1 for safety.   Therapeutic Exercises   Hip Flexion, Standing Bilaterally   Hip Flexion, Standing-Assist Active   Hip Flexion, Standing-Reps 10  (unsupported; minAx1 for balance)   Mini Squats-Reps 10  (minAx1)   Additional comments Calf raises, heel rocks x10 with minAx1. Patient presents with poor dynamic balance reuqiring minAx1 with intermittent need of modAx1 to regain balance. Patient dmeonstrtaed 1 significant LOB during standing marches reuqiring modAx1  and assist for controlled descent back onto bed. Educated patient on taking her time during functional mobillity in order to promote safety. Educated patient and family on continues hallway walks to promtoe mobility.   Balance   Balance Tested   Sitting - Static Independent ;Unsupported   Standing - Static Unsupported  (SBA)   Standing - Dynamic Mod assist;Unsupported   PT AM-PAC Mobility   Turning over in bed? 1   Sitting down on and standing up from a chair with arms? 1   Moving from lying on back to sitting on the side of the bed? 1   Moving to and from a bed to a chair? 3   Need to walk in hospital room? 3   Climbing 3 - 5 steps with a railing? 1   Total Raw  Score 10   Standardized Score 32.29   CMS 1-100% Score 77   Medicare Functional Limit Modifiers CL  60 - < 80% impaired, limited, restricted   Additional Comments   Additional comments Patient reports imparied balance at baseline, having history of a couple of falls with injuries.   Assessment   Brief Assessment Remains appropriate for skilled therapy   Additional Comments Patient continues to require assist for mobility for safety. She will continue to benefit from skilled PT to maximize functiona  as patient was independent prior to admission.   Plan/Recommendation   Treatment Interventions Restorative PT   PT Frequency 3-5x/wk   Hospital Stay Recommendations Out of bed with nursing assist;Ambulate daily with nursing assist;May be out of bed with family assist;May ambulate with family assist;Home care referral   Discharge Recommendations Anticipate return to prior living arrangement;Home PT   PT Discharge Equipment Recommended To be determined   Assessment/Recommendations Reviewed With: Patient;Family   Next Visit higher level balance   Time Calculation   PT Timed Codes 24   PT Untimed Codes 0   PT Unbilled Time 0   PT Total Treatment 24   Plan and Onset date   Plan of Care Date 09/30/16   Onset Date 09/29/16   Treatment Start Date 09/30/16   PT Charges   $PT [facility] Charges Gait Training - code 0208 (39min);THER EX - code 6256 (73min)     Rodena Piety, PT, DPT  Pager [phone]

## 2016-10-03 NOTE — Progress Notes (Signed)
Palliative Care Progress Note    Objective: Pt sitting upright in bed. Visiting with her daughter and friend and eating lunch. Smiling intermittently and appears to be feeling better.      Subjective: Slightly logy today post procedure (IR right iliac block) but denies drowsiness with changes in dilaudid PCA yesterday. Describes her pain as intermittent and traveling from right to left upper quadrant instead of the band like pain she previously had. She denies, N/V, constipation, and diarrhea. She is planned for OR tomorrow afternoon for a mediport and ex lap by Dr Ron Agee. Plans for chemo Friday.      Palliative Care ROS:  See above     Physical Examination:   BP: (102-135)/(51-72)   Temp:  [36.3 C (97.3 F)-36.9 C (98.4 F)]   Temp src: Temporal (08/01 1246)  Heart Rate:  [62-85]   Resp:  [12-20]   SpO2:  [92 %-100 %]   General appearance: alert and oriented, more interactive today.  Lungs: clear to auscultation bilaterally  Heart: S1, S2 normal  Abdomen: Soft , mild tender upper quadrants, active BS all quadrants, slight bloating.   Extremities: palpable pulses, w/o edema  Skin: pale, dry, + bruising  Neurologic: A+O x 3, interactive with improved mood from yesterday. Glad things are moving along. She was able to sleep 6 hours total but then "got behind" when asleep.     Assessment/Plan:  Patient is a 71 y.o.female w hypothyroidism, fibromyalgia, s/p cholecystectomy who was a transfer from OSH on 09/29/16 for workup for a pancreatic massand pain control. Palliative care consult called by primary team to address pain control. 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 Currently several hours post IR right celiac plexus block. Plans for OR 8/2 w ex lap and medi port placement. Likely chemo to start 8/3.    Pain  - currently on dilaudid PCA 0.7 mg continuous, w 0.7 mg q 30 minutes. Calculation of dilaudid used over the past 24 hrs was 27 mg.  She states her pain now 5/10. Recommend continuation of current rate. Post  op can evaluate pain and make recommendations when ready for oral route.   - Full results of celiac block may not be evident for several days. Therefore continue IV PCA prior to conversion to oral regime.   -encourage tylenol 650 mg q 6 hrs  - continue decadron 4 mg bid as likely beneficial to reduce inflammation and visceral pain at this time.   - Lyrica started in [facility] prior to transfer to [facility] and was not her home medication. Will need prescription if to continue after discharge.     Constipation/Elimination  - continue colace ,senna with hold parameters for loose stool.   - continue increased ambulation.       Nausea  -continue reglan, zofran prn  -consider holding off on MVI when nausea present.  -encouraged frequent small meals which has helped along with the pain control.      Will continue to follow for emotional support and pain control. Daughter and patient coping well at present time. Nakira plans to stay in [place]  for chemotherapy. Son returned to [place] with his wife.     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       No Known Allergies (drug, envir, food or latex)    Scheduled Meds:    enoxaparin  40 mg Subcutaneous Daily  PROSOURCE NO CARB  30 mL Oral BID WC    senna  2 tablet Oral 2 times per day    polyethylene glycol  17 g Oral 2 times per day    dexamethasone  4 mg Oral BID    pantoprazole  40 mg Oral QAM    aspirin  81 mg Oral Daily    Vitamins - senior  1 tablet Oral Daily    docusate sodium  200 mg Oral 2 times per day    pregabalin  50 mg Oral 2 times per day    triamterene-hydrochlorothiazide  1 tablet Oral Daily    potassium chloride SA  40 mEq Oral Daily    levothyroxine  137 mcg Oral Daily       Continuous Infusions:    sodium chloride 100 mL/hr (10/03/16 1210)    HYDROmorphone         PRN Meds:  ondansetron, metoclopramide IV, acetaminophen

## 2016-10-04 ENCOUNTER — Encounter
Admission: AD | Disposition: A | Discharge status: Home Health | Payer: Self-pay | Source: Other Acute Inpatient Hospital | Attending: Hematology and Oncology

## 2016-10-04 ENCOUNTER — Inpatient Hospital Stay: Payer: PRIVATE HEALTH INSURANCE | Admitting: Student in an Organized Health Care Education/Training Program

## 2016-10-04 ENCOUNTER — Inpatient Hospital Stay: Payer: PRIVATE HEALTH INSURANCE

## 2016-10-04 DIAGNOSIS — G8929 Other chronic pain: Secondary | ICD-10-CM

## 2016-10-04 DIAGNOSIS — Z452 Encounter for adjustment and management of vascular access device: Secondary | ICD-10-CM

## 2016-10-04 DIAGNOSIS — C250 Malignant neoplasm of head of pancreas: Secondary | ICD-10-CM | POA: Diagnosis present

## 2016-10-04 DIAGNOSIS — R109 Unspecified abdominal pain: Secondary | ICD-10-CM

## 2016-10-04 DIAGNOSIS — Z515 Encounter for palliative care: Secondary | ICD-10-CM

## 2016-10-04 DIAGNOSIS — C259 Malignant neoplasm of pancreas, unspecified: Secondary | ICD-10-CM

## 2016-10-04 HISTORY — PX: PR LAP,DIAGNOSTIC ABDOMEN: 49320

## 2016-10-04 HISTORY — PX: PR INSERT TUNNELED CV CATH WITH PORT: 36561

## 2016-10-04 HISTORY — PX: PR LAPS ABD PRTM&OMENTUM DX W/WO SPEC BR/WA SPX: 49320

## 2016-10-04 HISTORY — PX: PR INSJ TUNNELED CTR VAD W/SUBQ PORT AGE 5 YR/>: 36561

## 2016-10-04 LAB — PROTIME-INR
INR: 1 (ref 0.9–1.1)
Protime: 11.4 s (ref 10.0–12.9)

## 2016-10-04 LAB — POCT GLUCOSE: Glucose POCT: 86 mg/dL (ref 60–99)

## 2016-10-04 LAB — CBC AND DIFFERENTIAL
Baso # K/uL: 0 10*3/uL (ref 0.0–0.1)
Basophil %: 0.3 %
Eos # K/uL: 0 10*3/uL (ref 0.0–0.4)
Eosinophil %: 0 %
Hematocrit: 32 % — ABNORMAL LOW (ref 34–45)
Hemoglobin: 10.2 g/dL — ABNORMAL LOW (ref 11.2–15.7)
IMM Granulocytes #: 0.1 10*3/uL (ref 0.0–0.1)
IMM Granulocytes: 0.5 %
Lymph # K/uL: 1.4 10*3/uL (ref 1.2–3.7)
Lymphocyte %: 12.9 %
MCH: 31 pg/cell (ref 26–32)
MCHC: 32 g/dL (ref 32–36)
MCV: 97 fL — ABNORMAL HIGH (ref 79–95)
Mono # K/uL: 0.6 10*3/uL (ref 0.2–0.9)
Monocyte %: 5.3 %
Neut # K/uL: 8.9 10*3/uL — ABNORMAL HIGH (ref 1.6–6.1)
Nucl RBC # K/uL: 0 10*3/uL (ref 0.0–0.0)
Nucl RBC %: 0 /100 WBC (ref 0.0–0.2)
Platelets: 396 10*3/uL — ABNORMAL HIGH (ref 160–370)
RBC: 3.3 MIL/uL — ABNORMAL LOW (ref 3.9–5.2)
RDW: 13.2 % (ref 11.7–14.4)
Seg Neut %: 81 %
WBC: 11 10*3/uL — ABNORMAL HIGH (ref 4.0–10.0)

## 2016-10-04 LAB — COMPREHENSIVE METABOLIC PANEL
ALT: 44 U/L — ABNORMAL HIGH (ref 0–35)
AST: 32 U/L (ref 0–35)
Albumin: 3.6 g/dL (ref 3.5–5.2)
Alk Phos: 75 U/L (ref 35–105)
Anion Gap: 12 (ref 7–16)
Bilirubin,Total: <0.2 mg/dL (ref 0.0–1.2)
CO2: 28 mmol/L (ref 20–28)
Calcium: 9.1 mg/dL (ref 8.6–10.2)
Chloride: 97 mmol/L (ref 96–108)
Creatinine: 0.67 mg/dL (ref 0.51–0.95)
GFR,Black: 103 *
GFR,Caucasian: 89 *
Glucose: 123 mg/dL — ABNORMAL HIGH (ref 60–99)
Lab: 16 mg/dL (ref 6–20)
Potassium: 4.4 mmol/L (ref 3.3–5.1)
Sodium: 137 mmol/L (ref 133–145)
Total Protein: 6.1 g/dL — ABNORMAL LOW (ref 6.3–7.7)

## 2016-10-04 LAB — TYPE AND SCREEN
ABO RH Blood Type: A NEG
Antibody Screen: NEGATIVE

## 2016-10-04 LAB — MAGNESIUM: Magnesium: 1.5 mEq/L (ref 1.3–2.1)

## 2016-10-04 LAB — PHOSPHORUS: Phosphorus: 5.2 mg/dL — ABNORMAL HIGH (ref 2.7–4.5)

## 2016-10-04 SURGERY — LAPAROSCOPY, DIAGNOSTIC
Anesthesia: General | Site: Neck | Laterality: Right | Wound class: Clean

## 2016-10-04 MED ORDER — HYDROMORPHONE HCL PF 0.5 MG/0.5 ML IJ SOLN *I*
INTRAMUSCULAR | Status: AC
Start: 2016-10-04 — End: 2016-10-04
  Administered 2016-10-04: 0.5 mg via INTRAVENOUS
  Filled 2016-10-04: qty 0.5

## 2016-10-04 MED ORDER — ACETAMINOPHEN 10 MG/ML IV SOLN *I*
INTRAVENOUS | Status: DC | PRN
Start: 2016-10-04 — End: 2016-10-04
  Administered 2016-10-04: 1000 mg via INTRAVENOUS

## 2016-10-04 MED ORDER — DEXAMETHASONE SODIUM PHOSPHATE 4 MG/ML INJ SOLN *WRAPPED*
INTRAMUSCULAR | Status: AC
Start: 2016-10-04 — End: 2016-10-04
  Filled 2016-10-04: qty 1

## 2016-10-04 MED ORDER — LIDOCAINE HCL 2 % IJ SOLN *I*
INTRAMUSCULAR | Status: DC | PRN
Start: 2016-10-04 — End: 2016-10-04
  Administered 2016-10-04: 60 mg via INTRAVENOUS

## 2016-10-04 MED ORDER — BUPIVACAINE HCL 0.25 % IJ SOLUTION *WRAPPED*
Status: DC | PRN
Start: 2016-10-04 — End: 2016-10-04
  Administered 2016-10-04: 17 mL via SUBCUTANEOUS
  Administered 2016-10-04: 13 mL via SUBCUTANEOUS

## 2016-10-04 MED ORDER — PHENYLEPHRINE 100 MCG/ML IN NS 10 ML *WRAPPED*
INTRAMUSCULAR | Status: DC | PRN
Start: 2016-10-04 — End: 2016-10-04
  Administered 2016-10-04 (×5): 100 ug via INTRAVENOUS

## 2016-10-04 MED ORDER — SUGAMMADEX SODIUM 100 MG/1ML IV SOLN *WRAPPED*
INTRAVENOUS | Status: AC
Start: 2016-10-04 — End: 2016-10-04
  Filled 2016-10-04: qty 2

## 2016-10-04 MED ORDER — HYDROMORPHONE PCA 1 MG/ML *WRAPPED*
INTRAMUSCULAR | Status: AC
Start: 2016-10-04 — End: 2016-10-04
  Filled 2016-10-04: qty 25

## 2016-10-04 MED ORDER — HEPARIN LOCK FLUSH 10 UNIT/ML IJ SOLN WRAPPED *I*
INTRAVENOUS | Status: AC
Start: 2016-10-04 — End: 2016-10-04
  Filled 2016-10-04: qty 10

## 2016-10-04 MED ORDER — FENTANYL CITRATE 50 MCG/ML IJ SOLN *WRAPPED*
INTRAMUSCULAR | Status: AC
Start: 2016-10-04 — End: 2016-10-04
  Filled 2016-10-04: qty 2

## 2016-10-04 MED ORDER — OXYCODONE HCL 5 MG/5ML PO SOLN *I*
5.0000 mg | Freq: Once | ORAL | Status: DC | PRN
Start: 2016-10-04 — End: 2016-10-04

## 2016-10-04 MED ORDER — ACETAMINOPHEN 10 MG/ML IV SOLN *I*
INTRAVENOUS | Status: AC
Start: 2016-10-04 — End: 2016-10-04
  Filled 2016-10-04: qty 100

## 2016-10-04 MED ORDER — PROPOFOL 10 MG/ML IV EMUL (INTERMITTENT DOSING) WRAPPED *I*
INTRAVENOUS | Status: AC
Start: 2016-10-04 — End: 2016-10-04
  Filled 2016-10-04: qty 20

## 2016-10-04 MED ORDER — OXYCODONE-ACETAMINOPHEN 5-325 MG PO TABS *I*
1.0000 | ORAL_TABLET | Freq: Four times a day (QID) | ORAL | Status: DC | PRN
Start: 2016-10-04 — End: 2016-10-04

## 2016-10-04 MED ORDER — PROPOFOL 10 MG/ML IV EMUL (INTERMITTENT DOSING) WRAPPED *I*
INTRAVENOUS | Status: DC | PRN
Start: 2016-10-04 — End: 2016-10-04
  Administered 2016-10-04: 120 mg via INTRAVENOUS

## 2016-10-04 MED ORDER — CEFAZOLIN 2000 MG IN STERILE WATER 20ML SYRINGE *I*
2000.0000 mg | PREFILLED_SYRINGE | Freq: Once | INTRAVENOUS | Status: AC
Start: 2016-10-04 — End: 2016-10-04
  Administered 2016-10-04: 2000 mg via INTRAVENOUS
  Filled 2016-10-04: qty 20

## 2016-10-04 MED ORDER — LIDOCAINE HCL 2 % (PF) IJ SOLN *I*
INTRAMUSCULAR | Status: AC
Start: 2016-10-04 — End: 2016-10-04
  Filled 2016-10-04: qty 5

## 2016-10-04 MED ORDER — HYDROMORPHONE HCL PF 0.5 MG/0.5 ML IJ SOLN *I*
INTRAMUSCULAR | Status: AC
Start: 2016-10-04 — End: 2016-10-04
  Filled 2016-10-04: qty 0.5

## 2016-10-04 MED ORDER — PROMETHAZINE HCL 25 MG/ML IJ SOLN *I*
INTRAMUSCULAR | Status: DC | PRN
Start: 2016-10-04 — End: 2016-10-04
  Administered 2016-10-04: 12.5 mg via INTRAVENOUS
  Administered 2016-10-04: 6.25 mg via INTRAVENOUS

## 2016-10-04 MED ORDER — ROCURONIUM BROMIDE 10 MG/ML IV SOLN *WRAPPED*
Status: AC
Start: 2016-10-04 — End: 2016-10-04
  Filled 2016-10-04: qty 5

## 2016-10-04 MED ORDER — SODIUM CHLORIDE BACTERIOSTATIC 0.9 % IJ SOLN WRAPPED  *I*
INTRAMUSCULAR | Status: DC | PRN
Start: 2016-10-04 — End: 2016-10-04
  Administered 2016-10-04: 10 mL

## 2016-10-04 MED ORDER — DEXAMETHASONE SODIUM PHOSPHATE 4 MG/ML INJ SOLN *WRAPPED*
INTRAMUSCULAR | Status: DC | PRN
Start: 2016-10-04 — End: 2016-10-04
  Administered 2016-10-04: 4 mg via INTRAVENOUS

## 2016-10-04 MED ORDER — PROMETHAZINE HCL 25 MG/ML IJ SOLN *I*
6.2500 mg | Freq: Once | INTRAMUSCULAR | Status: DC | PRN
Start: 2016-10-04 — End: 2016-10-04

## 2016-10-04 MED ORDER — PROMETHAZINE HCL 25 MG/ML IJ SOLN *I*
INTRAMUSCULAR | Status: AC
Start: 2016-10-04 — End: 2016-10-04
  Filled 2016-10-04: qty 1

## 2016-10-04 MED ORDER — LACTATED RINGERS IV SOLN *I*
INTRAVENOUS | Status: DC | PRN
Start: 2016-10-04 — End: 2016-10-04

## 2016-10-04 MED ORDER — PHENYLEPHRINE 100 MCG/ML IN NS 10 ML *WRAPPED*
INTRAMUSCULAR | Status: AC
Start: 2016-10-04 — End: 2016-10-04
  Filled 2016-10-04: qty 10

## 2016-10-04 MED ORDER — HYDROMORPHONE HCL 2 MG/ML IJ SOLN *WRAPPED*
0.5000 mg | Freq: Once | INTRAMUSCULAR | Status: AC
Start: 2016-10-04 — End: 2016-10-04
  Administered 2016-10-04: 0.5 mg via INTRAVENOUS

## 2016-10-04 MED ORDER — ONDANSETRON HCL 2 MG/ML IV SOLN *I*
INTRAMUSCULAR | Status: DC | PRN
Start: 2016-10-04 — End: 2016-10-04
  Administered 2016-10-04: 4 mg via INTRAMUSCULAR

## 2016-10-04 MED ORDER — OXYCODONE HCL 5 MG/5ML PO SOLN *I*
10.0000 mg | Freq: Once | ORAL | Status: DC | PRN
Start: 2016-10-04 — End: 2016-10-04

## 2016-10-04 MED ORDER — HALOPERIDOL LACTATE 5 MG/ML IJ SOLN *I*
0.5000 mg | Freq: Once | INTRAMUSCULAR | Status: AC | PRN
Start: 2016-10-04 — End: 2016-10-04

## 2016-10-04 MED ORDER — SUGAMMADEX SODIUM 100 MG/1ML IV SOLN *WRAPPED*
INTRAVENOUS | Status: DC | PRN
Start: 2016-10-04 — End: 2016-10-04
  Administered 2016-10-04: 200 mg via INTRAVENOUS

## 2016-10-04 MED ORDER — MIDAZOLAM HCL 1 MG/ML IJ SOLN *I* WRAPPED
INTRAMUSCULAR | Status: DC | PRN
Start: 2016-10-04 — End: 2016-10-04
  Administered 2016-10-04: 2 mg via INTRAVENOUS

## 2016-10-04 MED ORDER — ACETAMINOPHEN 500 MG PO TABS *I*
1000.0000 mg | ORAL_TABLET | Freq: Once | ORAL | Status: DC | PRN
Start: 2016-10-04 — End: 2016-10-04

## 2016-10-04 MED ORDER — MIDAZOLAM HCL 1 MG/ML IJ SOLN *I* WRAPPED
INTRAMUSCULAR | Status: AC
Start: 2016-10-04 — End: 2016-10-04
  Filled 2016-10-04: qty 2

## 2016-10-04 MED ORDER — PLASMA-LYTE IV SOLN *WRAPPED*
Status: DC | PRN
Start: 2016-10-04 — End: 2016-10-04

## 2016-10-04 MED ORDER — HYDROMORPHONE HCL PF 0.5 MG/0.5 ML IJ SOLN *I*
0.5000 mg | INTRAMUSCULAR | Status: AC | PRN
Start: 2016-10-04 — End: 2016-10-04
  Administered 2016-10-04: 0.5 mg via INTRAVENOUS

## 2016-10-04 MED ORDER — ONDANSETRON HCL 2 MG/ML IV SOLN *I*
4.0000 mg | Freq: Once | INTRAMUSCULAR | Status: DC | PRN
Start: 2016-10-04 — End: 2016-10-04

## 2016-10-04 MED ORDER — ROCURONIUM BROMIDE 10 MG/ML IV SOLN *WRAPPED*
Status: DC | PRN
Start: 2016-10-04 — End: 2016-10-04
  Administered 2016-10-04: 30 mg via INTRAVENOUS

## 2016-10-04 MED ORDER — BUPIVACAINE HCL 0.25 % IJ SOLUTION *WRAPPED*
Status: AC
Start: 2016-10-04 — End: 2016-10-04
  Filled 2016-10-04: qty 30

## 2016-10-04 MED ORDER — ONDANSETRON HCL 2 MG/ML IV SOLN *I*
INTRAMUSCULAR | Status: AC
Start: 2016-10-04 — End: 2016-10-04
  Filled 2016-10-04: qty 2

## 2016-10-04 MED ORDER — HEPARIN SODIUM 5000 UNIT/ML SQ *I*
SUBCUTANEOUS | Status: AC
Start: 2016-10-04 — End: 2016-10-04
  Filled 2016-10-04: qty 1

## 2016-10-04 MED ORDER — HEPARIN SODIUM 5000 UNIT/ML SQ *I*
SUBCUTANEOUS | Status: DC | PRN
Start: 2016-10-04 — End: 2016-10-04
  Administered 2016-10-04: 5000 [IU] via SUBCUTANEOUS

## 2016-10-04 SURGICAL SUPPLY — 53 items
ADHESIVE MASTISOL 2/3CC VIAL (Dressing) ×3 IMPLANT
BAG ZIPLOCK BIOHAZ W/POUCH 6X9 USE 246168 (Supply) IMPLANT
CANISTER SUCT PED 300ML (Supply) IMPLANT
CAUTERY BOVIE SURGICAL PENCIL (Supply) ×3 IMPLANT
CLOSURE STERI-STRIP REINF .5 X 4IN LF (Dressing) ×3 IMPLANT
CONNECTOR SUCT TBNG 5 IN 1 STER (Supply) IMPLANT
CONTAINER SPEC LEAKPRF 3 OZ (Supply) IMPLANT
COVER OR LIGHT DISP CAMERA (Supply) ×3 IMPLANT
DRAPE C ARM XRAY 41IN X 125IN EXTRA LONG (Drape) ×3 IMPLANT
DRAPE FLUID WARMER 44 X 44IN LF (Drape) IMPLANT
DRAPE SHEET 40X70 MED (Drape) ×3 IMPLANT
DRAPE WOUND EDGE PROTEC 10 3/4 (Drape) IMPLANT
DRAPE WOUND PROTECTOR 4.75 LF (Drape) IMPLANT
DRESSING TEGADERM W/LABEL 2 3/8 X 2 3/4 (Dressing) ×3 IMPLANT
DRESSING TEGADERM W/LABEL 4 X 4 3/4IN (Dressing) ×12 IMPLANT
DRESSING TELFA 8X3 RELEASE (Dressing) IMPLANT
FILTER NEPTUNE 4PORT MANIFOLD (Supply) IMPLANT
GLOVE BIOGEL PI MICRO SZ 6.5 (Glove) ×15 IMPLANT
GLOVE LINER BIOGEL INDICATOR SZ7 LTX (Glove) ×15 IMPLANT
GOWN SIRIUS PLY REINF BRTH SLV XL XLONG (Gown) ×3 IMPLANT
HANDPIECE S/I 5MM X 32CM CANNULA DISP (Supply) IMPLANT
KIT LAPAROSCOPIC TO OPEN (Supply) IMPLANT
KIT NEEDLE GUIDE SCANNER 18G X 1.5-3.5CM (Supply) ×3 IMPLANT
NEEDLE FILTER 5 MICRON 19G (Needle) ×2 IMPLANT
NEEDLE FILTER 5 MICRON 19G 19G (Needle) ×1
PACK CUSTOM GENERAL LAPAROSCOPY (Pack) ×3 IMPLANT
PORT POWER MRI INTERMEDIATE SGL LUM 8FR (Implant) ×3 IMPLANT
SEALER LAPAROSCOPIC DIVIDER L37MM DIA5MM L HOOK RETRACTABLE LIGASURE (Supply) IMPLANT
SLEEVE XCEL ENDOPATH 5 X 100MM (Other) ×6 IMPLANT
SOL SOD CHL IRRIG .9PCT 1000ML BAG (Solution) IMPLANT
SOL SOD CHL IRRIG 500ML BTL (Solution) ×3 IMPLANT
SOL WATER IRRIG STERILE 1000ML BTL (Solution) ×3 IMPLANT
SPIKE MINI DISPENSING PIN (BBRAUN DP1000) (Supply) ×3 IMPLANT
SPONGE LAP 12X12 STERILE 5 PACK (Sponge) ×3 IMPLANT
STAPLER SKIN 35W NL (Supply) ×1
STAPLER SKIN WIDE STPL LEG L3.9MM WIRE DIA0.58MM FIX HD PROX (Supply) ×2 IMPLANT
SUTR MONCRYL 4-0 PS-2 UND 27IN (Suture) ×6 IMPLANT
SUTR MONOCRYL 4-0 PS-2 18 IN UNDY (Suture) ×6 IMPLANT
SUTR PROLENE MONO 2-0SH 48IN BLUE (Suture) ×3 IMPLANT
SUTR VICRYL 0 UR-6 CT 27 IN (Suture) ×6 IMPLANT
SUTR VICRYL ANTIB 0 UR-6 27 VIOLET (Suture) ×3 IMPLANT
SUTR VICRYL ANTIB 3-0 SH 27 UNDY (Suture) ×3 IMPLANT
SUTR VICRYL ANTIB UR-5 27 SZ0 VIOLET (Suture) IMPLANT
SUTR VICRYL O 3 X 18IN 45CM VIOLET (Suture) ×3 IMPLANT
SYRINGE LUER SLIP 5CC LF (Supply) ×3 IMPLANT
SYRINGE LUERLOCK CNTL 10CC (Supply) ×3 IMPLANT
SYRINGE MED 5CC W OUT NDL LUERSLIP CLEAR LF (Supply) ×2 IMPLANT
SYRINGE NEEDLE LUERLOCK 3CC 22G X 1.5IN (Supply) ×3 IMPLANT
SYRINGE ONLY 30ML SLIP TIP (Supply) ×3 IMPLANT
TRAP SUCT MUCOUS COLLECT 40ML (Supply) IMPLANT
TRAY FOLEY SIL PREC400 PREM U/M 14FR (Other) IMPLANT
TROCAR XCEL ENDOPATH BLADELESS 5X100MM (Other) ×3 IMPLANT
TUBING TANDEM LONG-18 IN (Supply) IMPLANT

## 2016-10-04 NOTE — Anesthesia Postprocedure Evaluation (Signed)
Anesthesia Post-Op Note    Patient: Amanda Galloway    Procedure(s) Performed:  Procedure Summary  Date:  10/04/2016 Anesthesia Start: 10/04/2016  9:02 AM Anesthesia Stop: 10/04/2016 11:25 AM Room / Location:  [id number] / [place] MAIN OR   Procedure(s):  LAPAROSCOPY DIAGNOSTIC  Right IJ MEDIPORT Insertion Diagnosis:  Pancreatic cancer [C25.9] Surgeon(s):  Delano Metz, MD Attending Anesthesiologist:  Benjamine Mola, MD         Recovery Vitals  BP: 120/63 (10/04/2016  1:30 PM)  Heart Rate: 66 (10/04/2016  4:34 PM)  Heart Rate (via Pulse Ox): 71 (10/04/2016  1:42 PM)  Resp: 15 (10/04/2016  1:42 PM)  Temp: 36.6 C (97.9 F) (10/04/2016  4:34 PM)  SpO2: 96 % (10/04/2016  4:34 PM)  O2 Flow Rate: 1 L/min (10/03/2016 11:25 AM)   0-10 Scale: 8 (10/04/2016  2:20 PM)  Anesthesia type:  General  Complications Noted During Procedure or in PACU:  None   Comment:    Patient Location:  PACU  Level of Consciousness:    Recovered to baseline and awake  Patient Participation:     Able to participate  Temperature Status:    Normothermic  Oxygen Saturation:    Within patient's normal range  Cardiac Status:   Within patient's normal range  Fluid Status:    Stable  Airway Patency:     Yes  Pulmonary Status:    Baseline and stable  Pain Management:    Adequate analgesia  Nausea and Vomiting:  None    Post Op Assessment:    Tolerated procedure well   Attending Attestation:  All indicated post anesthesia care provided     -

## 2016-10-04 NOTE — Op Note (Signed)
Operative Note (Surgical Log ID: [id number])       Date of Surgery: 10/04/2016       Surgeons: Juliann Mule) and Role:     * Delano Metz, MD - Primary       Pre-op Diagnosis: Pre-Op Diagnosis Codes:     * Pancreatic cancer [C25.9]       Post-op Diagnosis: Post-Op Diagnosis Codes:     * Pancreatic cancer [C25.9]       Procedure(s) Performed: Procedure:    LAPAROSCOPY DIAGNOSTIC  CPT(R) Code:  10258 - PR LAP,DIAGNOSTIC ABDOMEN    Procedure:    Right IJ MEDIPORT Insertion  CPT(R) Code:  52778 - PR INSERT TUNNELED CV CATH WITH PORT         Additional CPT Codes: 24235: Chg Fluoroguide Cntrl Ven Access,Place,Replace,Remove;  36144: Chg Ultrasonic Guidance, Intraoperative;         Anesthesia Type: General        Fluid Totals: I/O this shift:  08/02 0600 - 08/02 2259  In: 2017.2 (26.2 mL/kg) [I.V.:2017.2]  Out: 12 (0.2 mL/kg) [Blood:12]  Net: 2005.2  Weight: 77.1 kg        Estimated Blood Loss: Blood Loss: 12 mL       Specimens to Pathology:  * No specimens in log *       Temporary Implants:        Packing:                 Patient Condition: good       Indications: Pancreas cancer        Findings (Including unexpected complications): No metastasis visualized. Uneventful right IJ Mediport.      Indication: locally advanced and unresectable pancreas cancer.     Procedure: The patient was identified in the preoperative area by Dr. Delano Metz. The procedure was once again reviewed and informed consent was assured for diagnostic laparoscopy, Mediport. She was brought to the operating room, sedated and intubated without difficulty. Her abdomen was then prepped and draped in a sterile fashion. An official Time Out was performed which revealed the patient's name, date of birth, procedure, indication, allergies, and need for antibiotics and subcutaneous heparin. All staff were indicated by name.     A sterile ultrasound probe was then used in the right neck to evaluate the cervical vasculature including the carotid artery and its position in  relation to the internal jugular vein. She was placed in a Trendelenburg position and large-bore needle used to puncture the right IJ under direct ultrasound visualization. Aspiration of dark blood was readily visualized. A wire was passed without difficulty and no evidence of ectopy on cardiac rhythm. A single fluoroscopic image was obtained to evaluate the location of the wire and it was found to be appropriately placed at the SVC right atrial junction. This image was saved. A 2-cm upper chest incision was created in the right upper chest and dissection performed using electrocautery to the fascia. An appropriate-sized pocket for the Mediport was created using blunt and electrocautery dissection.     The external portion of the wire was measured and the appropriate length of catheter was cut. The neck incision was increased slightly and a tunneling device used to tunnel the Mediport catheter from the neck position to the upper chest pocket. The appropriate length of catheter was once again measured and cut. The catheter was attached to the [place] secured to the pectoral fascia chest wall in 3 locations using #1 Prolene.  A dilating breakaway sheath combination was then used to dilate the neck tract into the internal jugular vein. The wire and dilator were removed and the breakaway sheath remained. The Mediport catheter was then placed within the breakaway sheath and this was cracked and removed. A 2nd fluoroscopic image was obtained to evaluate proper location of the catheter within the SVC right atrial junction. Location of the catheter was perfectly placed. The port was then aspirated with excellent aspiration of blood and irrigated first with 5 mL of working sterile saline and a final flush of 4 mL of heparinized saline. The pocket was evaluated and all bleeding points coagulated. It was irrigated until the effluent clear and suctioned dry. 0.25% Marcaine was used for local analgesia. The  chest wall incision was then closed using 3-0 Vicryl in a deep dermal fashion and 4-0 Monocryl in a subcuticular fashion. The neck incision likewise was closed with 4-0 Monocryl in a subcuticular fashion. dermabond was applied. The chest was draped and we proceeded with the laparoscopy.     A longitudinal infra-umbilical incision was made and dissection was performed using electrocautery to the fascia. The fascia was lifted superiorly using a Kocher clamp and incised in a vertical manner. A cystic clamp was used to puncture the peritoneal cavity and safe entry into the peritoneal cavity was assured. A 5-mm port was placed and low flow initiated into the peritoneal cavity.Safe entry into the abdominal cavity was assured and a pneumoperitoneum of 15-mmHg was created. The 30-degree laparoscope was inserted and the abdominal cavity evaluated. Two further 5-mm ports were placed under direct visualization.     Evaluation of the abdominal cavity revealed no evidence of peritoneal metastasis. The left lateral lobe was elevated and we examined the gastrohepatic ligament. The hepatic artery was visualized and no bulky adenopathy was appreciated. A small hematoma was seen from the recent celiac neurolysis. No metastasis was visualized. We then examined the duodenum and porta which showed no metastasis or bulky adenopathy. Lastly the colon was lifted to assess the mesentery and no puckering or tumor growth was visualized. There was mild ascites present from her recent surgery. Otherwise this was normal.     The ports were removed under direct visualization and the pneumoperitoneum was released. The infra-umbilical fascial defect was closed using 3-0 Vicryl in a figure of eight fashion.     Local analgesia was injected into the subcutaneous tissue consisting of 1% lidocaine and 0.25% Marcaine mixed in a 50:50 proportion. The skin was closed using 4-0 Monocryl in a subcuticular fashion. Steri-strips and a sterile dressing were  applied.     All sponge and instrument counts were correct at the end of the case. The patient was awoken from anesthesia and brought to the post anesthesia unit in stable condition.     Dr. Delano Metz was present and participated in the entire case.    Signed: Delano Metz, MD 10-05-16 11:22 AM    Billing codes:  Mediport  [place]  Ultrasound guidance: [id number] or [id number]  Fluoroscopic guidance/supervision & interpretation: [id number]

## 2016-10-04 NOTE — Progress Notes (Signed)
Report Given To   1338- Patient is a responsive and oriented. Denies nausea. NVPS 0. Abdomenal and right chest sites dry and intact. Dr. Kris Hartmann notified for postop note. May go to floor. Patient is 96 % oxygen saturated on room air.

## 2016-10-04 NOTE — Plan of Care (Signed)
Chart reviewed and pt discussed with Care Coordinator Cam.  Pt with pancreatic cancer, now s/p exploratory lap and Mediport placement.  D/c planning remains ongoing, but pt with likely homecare needs.  RN Probation officer, while covering, will follow and can be reached at [phone].  For homecare needs that arise over the weekend, please call [phone] and page the homecare liaison team at [phone].

## 2016-10-04 NOTE — Progress Notes (Signed)
Attempted to see patient this morning status post celiac block, however patient was in the OR. Will follow up with her this afternoon.     Shon Baton, MD, MPH  PGY-5, Imaging Sciences

## 2016-10-04 NOTE — Progress Notes (Signed)
CXR completed and reviewed by Dr. Ron Agee. Looks ok no interventions at this time. Daun Peacock, RN

## 2016-10-04 NOTE — Anesthesia Procedure Notes (Signed)
---------------------------------------------------------------------------------------------------------------------------------------    AIRWAY   GENERAL INFORMATION AND STAFF    Patient location during procedure: OR       Date of Procedure: 10/04/2016 9:53 AM  CONDITION PRIOR TO MANIPULATION     Current Airway/Neck Condition:  Normal        For more airway physical exam details, see Anesthesia PreOp Evaluation  AIRWAY METHOD     Patient Position:  Sniffing    Preoxygenated: yes      Induction: IV    Mask Difficulty Assessment:  1 - vent by mask      Mask NMB: 1 - vent by mask      Technique Used for Successful ETT Placement:  Video laryngoscopy    Devices/Methods Used in Placement:  Intubating stylet    Blade Type:  Macintosh    Laryngoscope Blade/Video laryngoscope Blade Size:  3    Cormack-Lehane Classification:  Grade IIa - partial view of glottis    Placement Verified by: capnometry and auscultation      Number of Attempts at Approach:  1    Ventilation Between Attempts:  Bag valve mask    Number of Other Approaches Attempted:  1  Unsuccessful Airway(s) Attempted:  Oral ETT    Unsuccessful Approach(es) for ETT:  Direct laryngoscopy  FINAL AIRWAY DETAILS    Final Airway Type:  Endotracheal airway    Final Endotracheal Airway:  ETT      Cuffed: cuffed    Insertion Site:  Oral    ETT Size (mm):  7.0    Distance inserted from Lips (cm):  22  ----------------------------------------------------------------------------------------------------------------------------------------

## 2016-10-04 NOTE — Progress Notes (Signed)
Pain/Comfort     Patient's pain or discomfort is manageable Progressing towards goal          Cognitive function     Cognitive function will be maintained or return to baseline Maintaining        Mobility     Functional status is maintained or improved - Geriatric Maintaining        Nutrition     Nutritional status is maintained or improved - Geriatric Maintaining        Psychosocial     Demonstrates ability to cope with illness Maintaining        Safety     Patient will remain free of falls Maintaining          Assumed care of patient at [place]. VSS throughout shift. Pt gone for procedure for majority of shift. Pt remains on Dilaudid PCA; however, continues to report abdominal pain, as well as pain to right upper chest at newly placed port insertion site. Pt denies nausea and SOB. Pt's daughter present during shift. Bed alarm set when family out of room. Pt currently sitting up in chair with no voiced concerns at this time. Please refer to patient flowsheets and eMAR for further assessments and documentation.    Macallan Ord, RN      Morgan Stanley, RN

## 2016-10-04 NOTE — INTERIM OP NOTE (Signed)
Interim Op Note (Surgical Log ID: [id number])       Date of Surgery: 10/04/2016       Surgeons: Juliann Mule) and Role:     * Delano Metz, MD - Primary       Pre-op Diagnosis: Pre-Op Diagnosis Codes:     * Pancreatic cancer [C25.9]       Post-op Diagnosis: Post-Op Diagnosis Codes:     * Pancreatic cancer [C25.9]       Procedure(s) Performed: Procedure:    LAPAROSCOPY DIAGNOSTIC  CPT(R) Code:  00459 - PR LAP,DIAGNOSTIC ABDOMEN    Procedure:    Right IJ MEDIPORT Insertion  CPT(R) Code:  97741 - PR INSERT TUNNELED CV CATH WITH PORT         Additional CPT Codes: 42395: Chg Fluoroguide Cntrl Ven Access,Place,Replace,Remove;    32023: Chg Ultrasonic Guidance, Intraoperative;         Anesthesia Type: General        Fluid Totals: I/O this shift:  08/02 0600 - 08/02 2259  In: 2017.2 (26.2 mL/kg) [I.V.:2017.2]  Out: 12 (0.2 mL/kg) [Blood:12]  Net: 2005.2  Weight: 77.1 kg        Estimated Blood Loss: Blood Loss: 12 mL       Specimens to Pathology:  * No specimens in log *       Temporary Implants:        Packing:                 Patient Condition: good       Findings (Including unexpected complications): No evidence of metastasis     Signed:  Delano Metz, MD  on 10/04/2016 at 11:19 AM

## 2016-10-04 NOTE — Progress Notes (Signed)
Assumed care of patient 1900-0700. VSS throughout shift. Pt complained of abdominal pain during shift- continuous dilaudid 1:1 PCA managing pain well per pt report. 2L NC on overnight for comfort. Bed alarm on throughout shift. Pt resting comfortably in bed and call light is within reach. Please refer to patientflowsheets and eMAR for further assessments and documentation.    Rennie Natter, RN

## 2016-10-04 NOTE — Progress Notes (Signed)
Hepatobiliary Surgery Post-Operative Check    PROCEDURE  Right-sided IJ mediport insertion, diagnostic laparoscopy     SUBJECTIVE  Patient tolerated procedure well. Reports pain controlled with her PCA. Feels a little "out-of-it" since the operation. Denies n/v. Has not tried anything PO.    OBJECTIVE  Physical Exam:  Patient Vitals for the past 4 hrs:   BP Temp Temp src Pulse Resp SpO2   10/04/16 1342 - 36.8 C (98.2 F) TEMPORAL 72 15 95 %   10/04/16 1330 120/63 - - 71 - 96 %   10/04/16 1315 117/81 - - 72 14 95 %   10/04/16 1300 120/63 37.1 C (98.8 F) TEMPORAL 70 14 94 %   10/04/16 1245 139/71 - - 75 16 98 %     GEN: no apparent distress  HEENT: face symmetric, pinpoint pupils  CHEST: nonlabored respirations. Incision sites c/d/i. Dermabond over IJ access site, mediport with dressing in place.   ABD: soft, mildly distended, nontender. Incision sites c/d/i with dermabond in place.  NEURO/MOTOR: alert    ASSESSMENT  Amanda Galloway is a 71 y.o. female with h/o pancreatic mass who is now s/p right IJ mediport placement and diagnostic laparoscopy. Pt is doing well post operatively.     PLAN   Advance diet as tolerated   Analgesia prn, antiemetics prn   OOB/ambulate, DVT ppx, IS   Care per primary team    Author: Duwaine Maxin, MD  as of: 10/04/2016  at: 4:32 PM

## 2016-10-04 NOTE — Anesthesia Case Conclusion (Signed)
CASE CONCLUSION  Emergence  Actions:  Suctioned and extubated  Criteria Used for Airway Removal:  Adequate Tv & RR and acceptable O2 saturation  Assessment:  Routine  Transport  Directly to: PACU  Position:  Recumbent  Patient Condition on Handoff  Level of Consciousness:  Moderately sedated  Patient Condition:  Stable  Handoff Report to:  RN

## 2016-10-04 NOTE — Progress Notes (Signed)
Palliative Care Progress Note    Objective: [place] lying in bed somewhat drowsy post op ex lap and medi port but conversant. Daughter at bedside. Pt states pain in right shoulder and abdomen not controlled currently.       Subjective: Pt had placement of R IJ medi port and dx laparoscopy earlier today. Per surgery team, no signs of metastatic disease.      Palliative Care ROS:  She denies, h/a, CP, nausea/vomiting, constipation, (+ BM) today. She states new right shoulder pain and generalized 6/10 abdominal aching/bloating.    Physical Examination:   BP: (100-149)/(50-81)   Temp:  [36 C (96.8 F)-37.1 C (98.8 F)]   Temp src: Temporal (08/02 1634)  Heart Rate:  [60-105]   Resp:  [10-20]   SpO2:  [93 %-98 %]   General appearance: Drowsy but conversant and oriented.  Lungs: clear to auscultation bilaterally  Heart: S1, S2 normal  Abdomen: Soft , mild tender over lap sites, active BS all quadrants, slight bloating.   Extremities: palpable pulses, w/o edema  Skin: pale, dry, + bruising Right IJ dsg D+I, medi port accessed. Lap sites with derma bond   Neurologic: Alert + O x 3, feels somewhat drowsy after surgery.     Assessment/Plan:  Patient is a 71 y.o.female w hypothyroidism, fibromyalgia, s/p cholecystectomy who was a transfer from OSH on 09/29/16 for workup for a pancreatic massand pain control. Palliative care consult called by primary team to address pain control. 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 Currently status post IR right celiac plexus block 8/1 and 8/2 w ex lap and medi port placement. Likely chemo to start 8/3.    Pain  - currently on dilaudid PCA 0.7 mg continuous, w 0.7 mg q 30 minutes. PCA on hold intraop but restarted post op. Calculation of dilaudid used over previous 12 hrs was 17 mg.  She states her pain now 5/10. Recommend continuation of current rate. Evaluate tomorrow with plan for oral long acting and leaving PCA for breakthrough/calculation of prn dosage needs.    - Full results of  celiac block may not be evident for several days in addition to new post op pain. Evaluate for oral regime tomorrow.    -encourage tylenol 650 mg q 6 hrs  - continue decadron 4 mg bid as likely beneficial to reduce inflammation and visceral pain at this time.   - Lyrica started in [facility] prior to transfer to [facility] and was not her home medication. Will need prescription if to continue after discharge.     Constipation/Elimination  - continue colace ,senna with hold parameters for loose stool.   - continue increased ambulation.       Nausea  -continue reglan, zofran prn  -consider holding off on MVI when nausea present.  -encouraged frequent small meals which has helped along with the pain control.      Will continue to follow for emotional support and pain control. Daughter and patient coping well at present time. Parlee plans to stay in [place]  for chemotherapy. Son returned to [place] with his wife.     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    Malignant neoplasm of pancreas C25.9       No Known Allergies (drug, envir, food or latex)    Scheduled Meds:    PROSOURCE NO CARB  30 mL Oral BID WC  senna  2 tablet Oral 2 times per day    polyethylene glycol  17 g Oral 2 times per day    dexamethasone  4 mg Oral BID    pantoprazole  40 mg Oral QAM    aspirin  81 mg Oral Daily    Vitamins - senior  1 tablet Oral Daily    docusate sodium  200 mg Oral 2 times per day    pregabalin  50 mg Oral 2 times per day    triamterene-hydrochlorothiazide  1 tablet Oral Daily    potassium chloride SA  40 mEq Oral Daily    levothyroxine  137 mcg Oral Daily       Continuous Infusions:    HYDROmorphone         PRN Meds:  haloperidol lactate, ondansetron, metoclopramide IV, acetaminophen

## 2016-10-04 NOTE — OR Nursing (Signed)
Diagnostic Laparoscopy started at 10:38

## 2016-10-04 NOTE — Progress Notes (Addendum)
[facility] Surgery Progress Note    Subjective:  Celiac block yesterday. Continues with pain. NPO midnight.    Objective:    Temp (24hrs), Avg:36.7 C (98 F), Min:36.3 C (97.3 F), Max:37.1 C (98.8 F)    Blood pressure 100/66, pulse 74, temperature 36.6 C (97.9 F), temperature source Temporal, resp. rate 18, height 1.702 m (5' 7.01"), weight 77.1 kg (169 lb 14.4 oz), SpO2 94 %.    General: NAD  Pulm/Lungs:  unlabored  CV: rrr  GI/Abdomen: soft, epigastric tenderness, nd, no rebound  GU: voiding  Extremities:wwp      I/O last 3 completed shifts:  08/01 0700 - 08/02 0659  In: 4606.8 (59.8 mL/kg) [P.O.:540; I.V.:4066.8 (2.2 mL/kg/hr)]  Out: - (0 mL/kg)   Net: 4606.8  Weight: 77.1 kg            Labs:    Recent Labs  Lab 10/04/16  0114 10/03/16  0447 10/02/16  0446 10/01/16  0428 09/30/16  0413 09/29/16  1738   Sodium 137 138 138 138 140 140   Potassium 4.4 4.7 4.9 4.7 4.1 3.9   Chloride 97 98 98 98 102 100   CO2 '28 24 25 25 28 27   ' UN '16 12 6 6 9 7   ' Creatinine 0.67 0.69 0.56 0.64 0.72 0.59   Glucose 123* 104* 128* 137* 102* 101*   Calcium 9.1 9.7 9.4 9.3 8.9 9.3   Magnesium 1.5 1.5 1.5 1.3 1.3 1.3   Phosphorus 5.2* 4.5 3.9 3.6 4.5 3.7   No results for input(s): PGLU in the last 168 hours.      Recent Labs  Lab 10/04/16  0114 10/03/16  0447 10/02/16  0446 10/01/16  0428 09/30/16  0413 09/29/16  1738   Bilirubin,Total <0.2 <0.2 <0.2 0.2 0.2 <0.2   AST 32 26 32 36* 14 21   ALT 44* 31 34 '31 13 15   ' Alk Phos 75 76 75 81 65 72   Total Protein 6.1* 6.4 5.9* 6.0* 5.5* 5.9*   Albumin 3.6 3.8 3.6 3.7 3.4* 3.6   Prealbumin  --   --   --  23  --   --          Recent Labs  Lab 10/04/16  0114 10/03/16  0447 10/02/16  0942 10/02/16  0446 10/01/16  0428 09/30/16  0413 09/29/16  1738   WBC 11.0* 8.2  --  6.6 9.5 7.8 8.0   Hemoglobin 10.2* 11.3  --  10.4* 11.2 10.6* 11.3   Hematocrit 32* 36  --  33* 35 32* 34   Platelets 396* 413*  --  395* 433* 367 405*   Protime 11.4  --  11.4  --   --   --   --    INR 1.0  --  1.0  --   --   --   --         Imaging:   reviewed    Assessment and Plan:     71 y.o. female with new diagnosis of locally advanced pancreatic adenocarcinoma.        - OR today for diagnostic lap and mediport  - NPO, IVF  - Consent in chart  - Ancef on call to OR       Bluford Main, MD  10/04/2016 7:06 AM          [facility]-GI Attending Addendum:   I personally examined the patient, reviewed the notes, and discussed the plan of  care with the residents and patient. Agree with detailed resident's note. Please see the note above for details of history, exam, labs, assessment/plan which reflect my input.     71 year old female with locally advanced unresectable pancreatic cancer. 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 Received celiac neurolysis yesterday. Somewhat effective. No hyperbilirubinemia.     Plan for exploratory laparoscopy and Mediport today to start chemotherapy tomorrow.     BP 121/61 (BP Location: Left arm)   Pulse 70   Temp 37 C (98.6 F) (Temporal)    Resp 18   Ht 170.2 cm (5' 7.01")   Wt 77.1 kg (169 lb 14.4 oz)   SpO2 97%   BMI 26.6 kg/m2  Eyes: anicteric  Ear, nose, throat: Oral mucosa is moist   Cardiovascular: Regular rate and rhythm without murmurs  Respiratory:  Lungs are clear to auscultation bilaterally without wheezes, rhonchi, or rales.   Abdomen: Soft,  Tender epigastrium, non-distended. Well-healed recent incisions without herniation.   Extremities: Warm and without edema. PT pulses are 2+. There are no ulcerations or masses.   Neurologic: Alert and oriented   Psychiatric: Normal affect.    Locally advanced pancreatic cancer. Stable for procedure today. Will leave Mediport accessed for immediate use.     Consent obtained and signed.     Delano Metz, MD, FACS  Hepatobiliary, Pancreas & GI Surgery

## 2016-10-04 NOTE — Interval H&P Note (Signed)
UPDATES TO PATIENT'S CONDITION on the DAY OF SURGERY/PROCEDURE    I. Updates to Patient's Condition (to be completed by a provider privileged to complete a H&P, following reassessment of the patient by the provider):    Day of Surgery/Procedure Update:  History  History reviewed and no change    Physical  Physical exam updated and no change    Pre-Procedure Assessment  Airway Visibility: Soft palate  Loose/Broken Teeth, Oral Piercings: Not Applicable  Lungs: Clear  Heart sounds rhythm: Regular Rate Rhythm  ASA Physical status rating: Class III: Severe systemic disease       II. Procedure Readiness   I have reviewed the patient's H&P and updated condition. By completing and signing this form, I attest that this patient is ready for surgery/procedure.    III. Attestation   I have reviewed the updated information regarding the patient's condition and it is appropriate to proceed with the planned surgery/procedure.    Delano Metz, MD as of 8:54 AM 10/04/2016

## 2016-10-04 NOTE — Progress Notes (Addendum)
Hematology/Oncology Progress Note:      LOS: 5 days     HPI: [name] who was a transfer from OSH on 09/29/16 for workup for a pancreatic mass and pain control.    Interval Hx/Subjective:    - VSS, afebrile   - s/p celiac block yesterday, and s/p mediport and ex lap today with HPB  - patient said she is in horrible pain now after the surgery, will give an extra IV dose now to help her catch up    Review of Systems   Constitutional: Positive for malaise/fatigue. Negative for chills, fever and weight loss.   Respiratory: Positive for shortness of breath. Negative for cough.    Cardiovascular: Negative for chest pain and leg swelling.   Gastrointestinal: Positive for abdominal pain, constipation and nausea. Negative for diarrhea and vomiting.   Musculoskeletal: Positive for back pain.   Neurological: Positive for weakness.     Objective:     Blood pressure 121/61, pulse 70, temperature 37 C (98.6 F), temperature source Temporal, resp. rate 18, height 1.702 m (5' 7.01"), weight 77.1 kg (169 lb 14.4 oz), SpO2 97 %.    Physical Exam  Gen: Pale and appears fatigued, wincing in pain  CV: RRR  Chest: CTA B/L- wearing NC  Abd: hypoactive BS, soft and NT  Ext: WWP, +pedal pulses, no edema  Neuro: AAOx3, CNs grossly intact, no focal deficits    Assessment/Plan:     Patient is a 70 y.o.  female who has a newly found pancreatic mass and was transferred to [facility] from an OSH on 09/29/2016 for further workup and pain control. HPB consutled and not a surgical candidate at this time. IR celiac block 8/1 and Dr. Ron Agee placed mediport and performed ex lap 8/2.     New pancreatic head mass  - has not established care with an oncologist yet (likely will be Dr. Sharlyn Bologna)  - has not received treatment yet  - biopsy at [facility] is in patient hard chart: positive for malignancy-adenocarcinoma   - obtaing staging Ct abd/pelvis:  Ill defined tissue which abuts the SMA  - appreciate HPB following  - plan for mediport placement and ex  lap today 8/2 with Dr, Ron Agee   - plan to start chemo on Friday 8/3  - CA 19-9 at [facility] was 883, inpatient CA-[id number]    Pain  - tylenol prn  - appreciate PC following  - PCA to 0.7mg /hr and 0.7mg  q34mins  - s/p IR celiac block 8/1    Hypoxia  - CT angio- negative for PE, possible signs of aspiration  - incentive spirometry    HTN/HLD  - cont home ASA  - cont maxzide (pt takes this for edema - has been on this long term)    Hypothyroidism  - cont synthroid    Constipation/PUD ppx/Nausea  - Colace BID  - senna BID  - Miralax BID  - lactulose x3  - Zofran PRN  - protonix daily  - reglan prn    DVT/PPX:   - Lovenox SQ     FEN:  - PO, MIVF @100mL /hr  - Labs daily, replete PRN  - resume regular diet    PT consult RTPL w/ home PT    Full code    DISPO: Pending above  - Follow up with: needs to be set up with GI oncologist  - Tentative Discharge Date: unknown  - Rx/Equipment Needed: TBD    Author: William Dalton,  PA  as of: 10/04/2016  at: 8:18 AM       Patient seen and examined. See note by William Dalton, PA. 71 yo healthy F with recent diagnosis of locally advanced pancreatic uncinate adenocarcinoma.    Appreciate Palliative Care, IR and [facility] Surgery assistance. Celiac block yesterday with IR.  Port placement and diagnostic lap with Dr. Ron Agee today    Plan for cycle 1 of chemotherapy in-house given rapidly progressive disease - potentially start on Friday.  Discharge home once on oral pain regimen.      Mohamedtaki A. Sharlyn Bologna, M.D.  Medical Oncology

## 2016-10-05 ENCOUNTER — Encounter: Payer: Self-pay | Admitting: Surgical Oncology

## 2016-10-05 DIAGNOSIS — R110 Nausea: Secondary | ICD-10-CM

## 2016-10-05 DIAGNOSIS — K5900 Constipation, unspecified: Secondary | ICD-10-CM

## 2016-10-05 LAB — COMPREHENSIVE METABOLIC PANEL
ALT: 47 U/L — ABNORMAL HIGH (ref 0–35)
AST: 35 U/L (ref 0–35)
Albumin: 3.6 g/dL (ref 3.5–5.2)
Alk Phos: 76 U/L (ref 35–105)
Anion Gap: 11 (ref 7–16)
Bilirubin,Total: <0.2 mg/dL (ref 0.0–1.2)
CO2: 29 mmol/L — ABNORMAL HIGH (ref 20–28)
Calcium: 9 mg/dL (ref 8.6–10.2)
Chloride: 98 mmol/L (ref 96–108)
Creatinine: 0.73 mg/dL (ref 0.51–0.95)
GFR,Black: 96 *
GFR,Caucasian: 84 *
Glucose: 125 mg/dL — ABNORMAL HIGH (ref 60–99)
Lab: 14 mg/dL (ref 6–20)
Potassium: 4.5 mmol/L (ref 3.3–5.1)
Sodium: 138 mmol/L (ref 133–145)
Total Protein: 5.9 g/dL — ABNORMAL LOW (ref 6.3–7.7)

## 2016-10-05 LAB — CBC AND DIFFERENTIAL
Baso # K/uL: 0 10*3/uL (ref 0.0–0.1)
Basophil %: 0.2 %
Eos # K/uL: 0 10*3/uL (ref 0.0–0.4)
Eosinophil %: 0 %
Hematocrit: 33 % — ABNORMAL LOW (ref 34–45)
Hemoglobin: 10.5 g/dL — ABNORMAL LOW (ref 11.2–15.7)
IMM Granulocytes #: 0.1 10*3/uL (ref 0.0–0.1)
IMM Granulocytes: 0.8 %
Lymph # K/uL: 1.6 10*3/uL (ref 1.2–3.7)
Lymphocyte %: 17.1 %
MCH: 31 pg/cell (ref 26–32)
MCHC: 32 g/dL (ref 32–36)
MCV: 98 fL — ABNORMAL HIGH (ref 79–95)
Mono # K/uL: 0.7 10*3/uL (ref 0.2–0.9)
Monocyte %: 7.2 %
Neut # K/uL: 6.9 10*3/uL — ABNORMAL HIGH (ref 1.6–6.1)
Nucl RBC # K/uL: 0 10*3/uL (ref 0.0–0.0)
Nucl RBC %: 0 /100 WBC (ref 0.0–0.2)
Platelets: 426 10*3/uL — ABNORMAL HIGH (ref 160–370)
RBC: 3.4 MIL/uL — ABNORMAL LOW (ref 3.9–5.2)
RDW: 13.4 % (ref 11.7–14.4)
Seg Neut %: 74.7 %
WBC: 9.3 10*3/uL (ref 4.0–10.0)

## 2016-10-05 LAB — MAGNESIUM: Magnesium: 1.6 mEq/L (ref 1.3–2.1)

## 2016-10-05 LAB — PHOSPHORUS: Phosphorus: 4.4 mg/dL (ref 2.7–4.5)

## 2016-10-05 MED ORDER — ONDANSETRON HCL 2 MG/ML IV SOLN *I*
8.0000 mg | Freq: Three times a day (TID) | INTRAMUSCULAR | Status: DC
Start: 2016-10-05 — End: 2016-10-06
  Administered 2016-10-05 – 2016-10-06 (×3): 8 mg via INTRAVENOUS
  Filled 2016-10-05 (×3): qty 4

## 2016-10-05 MED ORDER — FLUOROURACIL (ADRUCIL) 1 GM/20ML IV SOLN *I*
320.0000 mg/m2 | Freq: Once | INTRAVENOUS | Status: AC
Start: 2016-10-05 — End: 2016-10-05
  Administered 2016-10-05: 582 mg via INTRAVENOUS
  Filled 2016-10-05: qty 11.64

## 2016-10-05 MED ORDER — IRINOTECAN HCL 100 MG/5ML IV SOLN *I*
90.0000 mg/m2 | Freq: Once | INTRAVENOUS | Status: AC
Start: 2016-10-05 — End: 2016-10-05
  Administered 2016-10-05: 164 mg via INTRAVENOUS
  Filled 2016-10-05: qty 8.2

## 2016-10-05 MED ORDER — SODIUM CHLORIDE 0.9 % IV SOLN WRAPPED *I*
75.0000 mL/h | Status: DC
Start: 2016-10-05 — End: 2016-10-12
  Administered 2016-10-05 – 2016-10-12 (×12): 75 mL/h via INTRAVENOUS

## 2016-10-05 MED ORDER — DEXAMETHASONE SOD PHOSPHATE PF 10 MG/ML IJ SOLN *I*
10.0000 mg | INTRAMUSCULAR | Status: AC
Start: 2016-10-05 — End: 2016-10-07
  Administered 2016-10-05 – 2016-10-07 (×3): 10 mg via INTRAVENOUS
  Filled 2016-10-05 (×3): qty 1

## 2016-10-05 MED ORDER — DEXTROSE 5 % IV SOLN WRAPPED *I*
30.0000 mL/h | INTRAVENOUS | Status: AC | PRN
Start: 2016-10-05 — End: 2016-10-06

## 2016-10-05 MED ORDER — DEXTROSE 5 % IV SOLN WRAPPED *I*
30.0000 mL/h | INTRAVENOUS | Status: AC | PRN
Start: 2016-10-05 — End: 2016-10-06
  Administered 2016-10-05: 30 mL/h via INTRAVENOUS

## 2016-10-05 MED ORDER — PROCHLORPERAZINE MALEATE 10 MG PO TABS *I*
10.0000 mg | ORAL_TABLET | Freq: Four times a day (QID) | ORAL | Status: DC | PRN
Start: 2016-10-05 — End: 2016-10-08

## 2016-10-05 MED ORDER — OXALIPLATIN 100 MG/20ML IV SOLN *I*
65.0000 mg/m2 | Freq: Once | INTRAVENOUS | Status: AC
Start: 2016-10-05 — End: 2016-10-07
  Administered 2016-10-05: 118 mg via INTRAVENOUS
  Filled 2016-10-05: qty 23.6

## 2016-10-05 MED ORDER — SODIUM CHLORIDE 0.9 % IV SOLN WRAPPED *I*
950.0000 mg/m2 | INTRAVENOUS | Status: AC
Start: 2016-10-05 — End: 2016-10-07
  Administered 2016-10-05 – 2016-10-06 (×2): 1729 mg via INTRAVENOUS
  Filled 2016-10-05 (×2): qty 34.58

## 2016-10-05 MED ORDER — ENOXAPARIN SODIUM 40 MG/0.4ML IJ SOSY *I*
40.0000 mg | PREFILLED_SYRINGE | Freq: Every day | INTRAMUSCULAR | Status: DC
Start: 2016-10-06 — End: 2016-10-14
  Administered 2016-10-05 – 2016-10-13 (×9): 40 mg via SUBCUTANEOUS
  Filled 2016-10-05 (×9): qty 0.4

## 2016-10-05 MED ORDER — SODIUM CHLORIDE 0.9 % IV SOLN WRAPPED *I*
150.0000 mg | Freq: Once | INTRAVENOUS | Status: AC
Start: 2016-10-05 — End: 2016-10-07
  Administered 2016-10-05: 150 mg via INTRAVENOUS
  Filled 2016-10-05: qty 5

## 2016-10-05 MED ORDER — LEUCOVORIN CALCIUM 350 MG IJ SOLR (50 MG/ML) *I*
320.0000 mg/m2 | Freq: Once | INTRAVENOUS | Status: AC
Start: 2016-10-05 — End: 2016-10-05
  Administered 2016-10-05: 582 mg via INTRAVENOUS
  Filled 2016-10-05: qty 11.64

## 2016-10-05 NOTE — Progress Notes (Signed)
MEDICAL ONCOLOGY BRIEF NOTE    71 yo healthy F with recent diagnosis of locally advanced pancreatic uncinate adenocarcinoma.    Proceed with cycle 1 of FOLFIRINOX chemotherapy in-house given rapidly progressive disease as follows:  5-Fluorouracil bolus 320mg /m2 (approx 20% dose reduction for frailty)  Leucovorin 320mg /m2  (approx 20% dose reduction for frailty)  5-Fluorouracil infusion 950mg /m2/24 hrs x2  (approx 20% dose reduction for frailty)  Oxaliplatin 65mg /m2  (approx 20% dose reduction for frailty)  Irinotecan 90mg /m2  (approx 50% dose reduction for frailty)    Neulasta 24-72 hours after infusion complete.      Mohamedtaki A. Sharlyn Bologna, M.D.  Medical Oncology

## 2016-10-05 NOTE — Progress Notes (Addendum)
Hematology/Oncology Progress Note:      LOS: 6 days     HPI: [name] who was a transfer from OSH on 09/29/16 for workup for a pancreatic mass and pain control.    Interval Hx/Subjective:    - VSS, afebrile   - s/p ex lap and port placement yesterday, no metastatic disease found  - overnight, RR 9 and patient making nonsensical statements, PCA was stopped and restarted this AM at a lower rate, she is now in 10/10 pain  - plan to start chemo later today    Review of Systems   Constitutional: Positive for malaise/fatigue. Negative for chills, fever and weight loss.   Respiratory: Positive for shortness of breath. Negative for cough.    Cardiovascular: Negative for chest pain and leg swelling.   Gastrointestinal: Positive for abdominal pain, constipation and nausea. Negative for diarrhea and vomiting.   Musculoskeletal: Positive for back pain.   Neurological: Positive for weakness.     Objective:     Blood pressure 125/65, pulse 68, temperature 36.4 C (97.5 F), temperature source Temporal, resp. rate 14, height 1.702 m (5' 7.01"), weight 77.1 kg (169 lb 14.4 oz), SpO2 100 %.    Physical Exam  Gen: Pale and appears fatigued, wincing in pain  CV: RRR  Chest: CTA B/L  Abd: hypoactive BS, soft and tender in LUQ/epigastric region  Ext: WWP, +pedal pulses, no edema  Neuro: AAOx3, CNs grossly intact, no focal deficits    Assessment/Plan:     Patient is a 71 y.o.  female who has a newly found pancreatic mass and was transferred to [facility] from an OSH on 09/29/2016 for further workup and pain control. HPB consutled and not a surgical candidate at this time. IR celiac block 8/1 and Dr. Ron Agee placed mediport and performed ex lap 8/2. Plan to start cycle 1 FOLFIRINOX today 8/3.    New pancreatic head mass  - has not established care with an oncologist yet (likely will be Dr. Sharlyn Bologna)  - has not received treatment yet  - biopsy at [facility] is in patient hard chart: positive for malignancy-adenocarcinoma   - obtaing  staging Ct abd/pelvis:  Ill defined tissue which abuts the SMA  - appreciate HPB following  - CA-19-9 [id number]  - s/p mediport placement and ex lap 8/2 with Dr, Ron Agee- no metastatic disease found  - plan to start cycle 1 of FOLFIRINOX today 8/3  - will need neulasta 24-72 hrs after infusion complete    Pain  - tylenol prn  - appreciate PC following  - PCA to 0.5mg /hr and 0.5mg  q14mins- decreased O/N on 8/3 due to RR 9, will need to address with palliative care  - s/p IR celiac block 8/1    Hypoxia  - CT angio- negative for PE, possible signs of aspiration  - incentive spirometry    HTN/HLD  - cont home ASA  - cont maxzide (pt takes this for edema - has been on this long term)    Hypothyroidism  - cont synthroid    Constipation/PUD ppx/Nausea  - Colace BID  - senna BID  - Miralax BID  - lactulose x3  - Zofran PRN  - protonix daily  - reglan prn    DVT/PPX:   - Lovenox SQ     FEN:  - PO  - Labs daily, replete PRN  - regular diet    PT consult RTPL w/ home PT    Full code  DISPO: Pending above  - Follow up with: needs to be set up with Dr. Sharlyn Bologna  - Tentative Discharge Date: unknown  - Rx/Equipment Needed: TBD    Author: William Dalton, PA  as of: 10/05/2016  at: 7:56 AM         Patient seen and examined. See note by William Dalton, PA. 71 yo healthy F with recent diagnosis of locally advanced pancreatic uncinate adenocarcinoma.    Appreciate Palliative Care and [facility]B Surgery assistance.  Port placement and diagnostic lap with Dr. Ron Agee yesterday.    Proceed with cycle 1 of FOLFIRINOX chemotherapy in-house today given rapidly progressive disease. All potential toxicities reviewed before she provided informed consent.     Discharge home once on oral pain regimen.  Neulasta tentatively scheduled for Tuesday, August 7 in clinic.      Mohamedtaki A. Sharlyn Bologna, M.D.  Medical Oncology

## 2016-10-05 NOTE — Progress Notes (Signed)
HPB Surgery Progress Note    Subjective:  Diagnostic laparoscopy and mediport yesterday. Continues with pain. Tolerating diet.    Objective:    Temp (24hrs), Avg:36.8 C (98.2 F), Min:36 C (96.8 F), Max:37.5 C (99.5 F)    Blood pressure 125/65, pulse 68, temperature 36.4 C (97.5 F), temperature source Temporal, resp. rate 14, height 1.702 m (5' 7.01"), weight 77.1 kg (169 lb 14.4 oz), SpO2 100 %.    General: NAD  Pulm/Lungs:  unlabored  CV: rrr  GI/Abdomen: soft, epigastric tenderness, nd, incisions c/d/i, no rebound  Extremities:wwp      I/O last 3 completed shifts:  08/02 0700 - 08/03 0659  In: 2100 (27.2 mL/kg) [I.V.:2100 (1.1 mL/kg/hr)]  Out: 1212 (15.7 mL/kg) [Urine:1200 (0.6 mL/kg/hr); Blood:12]  Net: 888  Weight: 77.1 kg            Labs:    Recent Labs  Lab 10/05/16  0335 10/04/16  0114 10/03/16  0447 10/02/16  0446 10/01/16  0428 09/30/16  0413   Sodium 138 137 138 138 138 140   Potassium 4.5 4.4 4.7 4.9 4.7 4.1   Chloride 98 97 98 98 98 102   CO2 29* _0 UN _1 Creatinine 0.73 0.67 0.69 0.56 0.64 0.72   Glucose 125* 123* 104* 128* 137* 102*   Calcium 9.0 9.1 9.7 9.4 9.3 8.9   Magnesium 1.6 1.5 1.5 1.5 1.3 1.3   Phosphorus 4.4 5.2* 4.5 3.9 3.6 4.5       Recent Labs  Lab 10/04/16  0735   Glucose POCT 86         Recent Labs  Lab 10/05/16  0335 10/04/16  0114 10/03/16  0447 10/02/16  0446 10/01/16  0428 09/30/16  0413   Bilirubin,Total <0.2 <0.2 <0.2 <0.2 0.2 0.2   AST 35 32 26 32 36* 14   ALT 47* 44* 31 34 31 13   Alk Phos 76 75 76 75 81 65   Total Protein 5.9* 6.1* 6.4 5.9* 6.0* 5.5*   Albumin 3.6 3.6 3.8 3.6 3.7 3.4*   Prealbumin  --   --   --   --  23  --          Recent Labs  Lab 10/05/16  0335 10/04/16  0114 10/03/16  0447 10/02/16  0942 10/02/16  0446 10/01/16  0428 09/30/16  0413   WBC 9.3 11.0* 8.2  --  6.6 9.5 7.8   Hemoglobin 10.5* 10.2* 11.3  --  10.4* 11.2 10.6*   Hematocrit 33* 32* 36  --  33* 35 32*   Platelets 426* 396* 413*  --  395* 433* 367   Protime  --   11.4  --  11.4  --   --   --    INR  --  1.0  --  1.0  --   --   --        Imaging:   reviewed    Assessment and Plan:     71 y.o. female with new diagnosis of locally advanced pancreatic adenocarcinoma. Celiac block 10/03/16. S/p diagnostic lap and mediport 10/04/16. No evidence of metastases.        - Diet as tolerates  - Chemotherapy per oncology      Bluford Main, MD  10/05/2016 7:50 AM

## 2016-10-05 NOTE — Significant Event (Addendum)
Continuous PCA dose stopped per order @0150  d/t RR 9. All other VSS. Pt arousable but making nonsensical statements at times. Will continue to monitor.    @0328 , pt woke up with 10/10 pain. Pt pushed PCA button with good relief. Continuous dose restarted per order with moderate effect. Pt mostly sleeping until shift change. RR back WNL. Will continue to monitor.    Lucky Cowboy, RN

## 2016-10-05 NOTE — Progress Notes (Signed)
Chart reviewed for current update. If d/c over week end ,please contact # [phone] for assistance.Lonzie Simmer, HCC/LTC   [phone]

## 2016-10-05 NOTE — Progress Notes (Signed)
Palliative Care Progress Note    Objective: Amanda Galloway sitting in chair and appears restless shifting body about in the chair. She states her pain was horrible when she woke in the middle of the night after the dilaudid drip was stopped. Pain not controlled after drip restarted 20.5 mg with 0.5 mg q 30 minutes.  Daughter at bedside and we reviewed overnight events.      Subjective: Overnight events noted for low RR of 9 and increased lethargy. Dilaudid drip held from 1:50 am until 3:28 when patient awoke with 10/10 pain. She used PCA button with good relief. PCA resumed at 0.5 mg continuous with 0.5 mg q 30 min prn. Pain rating since episode 8-10/10.     Palliative Care ROS: [name] endorses moderate discomfort in right upper chest and neck from port placement but more bothered by severe left upper quadrant pain which has gone from 10/10 to 7/10 currently. She denies nausea, constipation (soft formed BM this am), headache. Does endorse mild bloating.     Physical Examination:   BP: (90-125)/(55-72)   Temp:  [36.3 C (97.3 F)-37.5 C (99.5 F)]   Temp src: Temporal (08/03 1248)  Heart Rate:  [66-83]   Resp:  [9-16]   SpO2:  [95 %-100 %]   Height:  [168 cm (5' 6.14")]   Weight:  [71.3 kg (157 lb 3 oz)]   General appearance: Appears restless, repositioning self in chair.  Lungs: Few scattered rhonchi all lobes otherwise clear. Respirations easy, regular rate, and rhythm.  Heart: S1, S2 normal  Abdomen: Soft , mild tender over lap sites, active BS all quadrants, slight bloating.   Extremities: palpable pulses, w/o edema  Skin:warm and dry.  Right IJ dsg D+I, medi port accessed. Lap sites with derma bond   Neurologic: Alert + O x 3,Occasionally correcting words " oh I meant to say..." when conversing with [name].     Assessment/Plan:  Patient is a 71 y.o.female w hypothyroidism, fibromyalgia, s/p cholecystectomy who was a transfer from OSH on 09/29/16 for workup for a pancreatic massand pain control. Palliative care consult  called by primary team to address pain control. 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 Currently status post IR right celiac plexus block 8/1 and 8/2 w ex lap and medi port placement. Planned for chemo to start 8/3. Pain not controlled currently and unable to recommend oral basal regime as planned.     Pain  - currently on dilaudid PCA 0.5 mg continuous, w 0.5 mg q 30 minutes. Interruptions in gtt during OR yesterday and held overnight.  Calculation of dilaudid used over previous 12 hrs was 17 mg.  She states her pain now 7/10. Recommend increase basal rate to 0.7 mg/hr and increase 0.5 mg q 20 minutes prn.  Evaluate tomorrow with plan for long acting coverage and leaving PCA for breakthrough to determine prn dosage needs. Options for basal include fentanyl patch vs methadone. Either option would be amenable as Sharita plans to stay in [place] area with her daughter during treatment.   - After 48 hrs, suspect celiac block may not have had effect on pain control.    -encourage tylenol 650 mg q 6 hrs  - continue decadron 4 mg bid as likely beneficial to reduce inflammation and visceral pain at this time.   - Lyrica started in [facility] prior to transfer to [facility], likely not adding much to pain control but may be adding to intermittent drowsiness. Would recommend stopping. If continued will need prescription on  discharge.   -recommend f/u with Palliative care as an outpatient.    Constipation/Elimination  - continue colace , senna with hold parameters for loose stool.   - continue increased ambulation.       Nausea  -has been controlled but agree with prn zofran with reglan as second line therapy.  -encouraged frequent small meals which has helped along with the pain control.      Will continue to follow for emotional support and pain control. Daughter and patient coping well at present time. Amanda Galloway plans to stay in [place]  for chemotherapy. Son returned to [place] with his wife.     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    Malignant neoplasm of pancreas C25.9       No Known Allergies (drug, envir, food or latex)    Scheduled Meds:    fosaprepitant (EMEND) IV  150 mg Intravenous Once    dexamethasone PF  10 mg Intravenous Q24H    ondansetron  8 mg Intravenous Q8H    irinotecan (CAMPTOSAR) chemo infusion  90 mg/m2 (Treatment Plan Recorded) Intravenous Once    oxaliplatin (ELOXATIN) chemo infusion  65 mg/m2 (Treatment Plan Recorded) Intravenous Once    leucovorin (WELLCOVORIN) continuous infusion  320 mg/m2 (Treatment Plan Recorded) Intravenous Once    fluorouracil  320 mg/m2 (Treatment Plan Recorded) Intravenous Once    Fluorouracil (EFUDEX) infusion  950 mg/m2 (Treatment Plan Recorded) Intravenous Q23H    PROSOURCE NO CARB  30 mL Oral BID WC    senna  2 tablet Oral 2 times per day    polyethylene glycol  17 g Oral 2 times per day    dexamethasone  4 mg Oral BID    pantoprazole  40 mg Oral QAM    aspirin  81 mg Oral Daily    Vitamins - senior  1 tablet Oral Daily    docusate sodium  200 mg Oral 2 times per day    triamterene-hydrochlorothiazide  1 tablet Oral Daily    potassium chloride SA  40 mEq Oral Daily    levothyroxine  137 mcg Oral Daily       Continuous Infusions:    dextrose      dextrose      sodium chloride 75 mL/hr (10/05/16 1326)    HYDROmorphone         PRN Meds:  dextrose, dextrose, prochlorperazine, ondansetron, metoclopramide IV, acetaminophen

## 2016-10-05 NOTE — Consults (Signed)
Medical Nutrition Therapy - Follow Up    Admit Date: 09/29/2016    Patient Summary: 71 year old female who transferred from OSH for workup of pancreatic mass and pain control. She originally saw a GI specialist for constipation, is s/p cholecystectomy 1 month ago- but pain returned soon after. After continued workup an MCRP revealed a mass on head of pancreas, an ERCP was performed with biopsy pending- found to have locally advanced pancreatic cancer- biopsy diagnosis of adenocarcinoma- unresectable.     Interval Hx: s/p IR celiac black 8/1. S/p mediport placement 8/2. plan to start chemo today (8/3).     Pertinent Meds: reviewed   Pertinent Labs: reviewed   Reviewed I/O's    Access: PIV, IVAD    Food allergies: NKFA    Current diet: Regular  Supplements: Ensure Enlive QID, 30 mL ProSource BID    Nutrition Focused Physical Exam:    Edema: none   Abdomen: soft, epigastric tenderness  Skin: abdominal lap site x 3 incisions c/d/i  Body fat stores: adequate  Lean body mass stores: mild losses     Anthropometrics:  Height: 170 cm (5'7")  Current Wt: 71.3 kg 8/3  Admit Weight: 67.1 kg (148 lb)- stated  Ideal Body Weight: 72.3 kg + 10%  BMI: 23.2- normal range for age, lower end  Weight Hx: down 15-20 lbs x 4 months; 9-12% loss- severe for timeframe     Estimated Nutrient Needs:             (Based on 67 kg)                                          1675-2350 kcal/day (25-35 kcal/kg)                        80-100 g protein/day (1.2-1.5 g/kg)                         1675-2010 mL fluid/day (25-30 mL/kg)    Nutrition Assessment and Diagnosis:   Finishing 75-100% of meals/snacks with occasional 25-50%. Usually accepting of prosource. States she hasn't tried ensure and doesn't "want to until this is all behind me, well, you know what I mean". Encourager her to use ensure if she is unable to finish meals- did fair at breakfast- a few bites of eggs and 1/2 english muffin. Suspect she does better when family is there to encourage  her- anxiety re starting chemo might be a factor today as well (visibly anxious). Labs and medications reviewed. On reglan, anti-emetic/nausea, bowel regimen, 4 mg dexamethasone BID, and protonix. Glucose 86-125. CMP, PO4, Mg & I/O's ordered daily by team.     Malnutrition Status: Pt with evidence of moderate malnutrition , identified 7/30.     Nutrition Intervention:   1. Continue regular diet, Ensure Enlive QID, and 30 mL ProSource BID    Encourage small frequent meals and snacks. Educational handouts provided on initial assessment. Family encouraging.  2. Ok to hold multivitamin if it appears to impact nausea.   3. Weekly weights ordered- continue     Nutrition Monitoring/Evaluation:   1. Will monitor diet tolerance and intake, nutrition-related labs, weight trend, BM pattern, supplement acceptance.   2. Will monitor PO tolerance, nutrition-related labs, weight trend, BM pattern.   3. Will follow up per high nutrition risk  protocol.    Ammie Ferrier [place], [place] [phone]

## 2016-10-05 NOTE — Progress Notes (Addendum)
Cognitive function     Cognitive function will be maintained or return to baseline Maintaining        Mobility     Functional status is maintained or improved - Geriatric Maintaining        Nutrition     Nutritional status is maintained or improved - Geriatric Maintaining        Pain/Comfort     Patient's pain or discomfort is manageable Maintaining        Psychosocial     Demonstrates ability to cope with illness Maintaining        Safety     Patient will remain free of falls Maintaining          Assumed care of patient at [place]. VSS throughout shift. Pt denies nausea and SOB. Adjustments made to pt's Dilaudid PCA per provider order. Pt' reports pain well-controlled at tthis time. RR stable. Pt started Cycle #1, Day 1 of FOLFIRINOX today. Writer administered oxaliplatin with no concerns. Pt currently receiving leucovorin and irinotecan. Pt appears to be tolerating well with no voiced concerns. Oncoming RN to administer 5-FU bolus and infusion. Good blood return noted from port pre- and post-oxaliplatin and prior to irinotecan. Pt currently accompanied by daughter. No voiced concerns at this time. Please refer to patient flowsheets and eMAR for further assessments and documentation.    Liborio Nixon, RN

## 2016-10-05 NOTE — Progress Notes (Signed)
10/05/16 1400   [facility] Patient Class Review   Patient Class Review Inpatient   Patient Class Effective 09/29/2016.    Signa Kell, RN

## 2016-10-06 LAB — CBC AND DIFFERENTIAL
Baso # K/uL: 0 10*3/uL (ref 0.0–0.1)
Basophil %: 0.1 %
Eos # K/uL: 0 10*3/uL (ref 0.0–0.4)
Eosinophil %: 0 %
Hematocrit: 33 % — ABNORMAL LOW (ref 34–45)
Hemoglobin: 10.6 g/dL — ABNORMAL LOW (ref 11.2–15.7)
IMM Granulocytes #: 0.1 10*3/uL (ref 0.0–0.1)
IMM Granulocytes: 1.1 %
Lymph # K/uL: 1.1 10*3/uL — ABNORMAL LOW (ref 1.2–3.7)
Lymphocyte %: 11.7 %
MCH: 31 pg/cell (ref 26–32)
MCHC: 32 g/dL (ref 32–36)
MCV: 98 fL — ABNORMAL HIGH (ref 79–95)
Mono # K/uL: 0.4 10*3/uL (ref 0.2–0.9)
Monocyte %: 4.6 %
Neut # K/uL: 7.8 10*3/uL — ABNORMAL HIGH (ref 1.6–6.1)
Nucl RBC # K/uL: 0 10*3/uL (ref 0.0–0.0)
Nucl RBC %: 0 /100 WBC (ref 0.0–0.2)
Platelets: 431 10*3/uL — ABNORMAL HIGH (ref 160–370)
RBC: 3.4 MIL/uL — ABNORMAL LOW (ref 3.9–5.2)
RDW: 13.2 % (ref 11.7–14.4)
Seg Neut %: 82.5 %
WBC: 9.4 10*3/uL (ref 4.0–10.0)

## 2016-10-06 LAB — COMPREHENSIVE METABOLIC PANEL
ALT: 37 U/L — ABNORMAL HIGH (ref 0–35)
AST: 20 U/L (ref 0–35)
Albumin: 3.6 g/dL (ref 3.5–5.2)
Alk Phos: 78 U/L (ref 35–105)
Anion Gap: 12 (ref 7–16)
Bilirubin,Total: 0.2 mg/dL (ref 0.0–1.2)
CO2: 30 mmol/L — ABNORMAL HIGH (ref 20–28)
Calcium: 9.2 mg/dL (ref 8.6–10.2)
Chloride: 95 mmol/L — ABNORMAL LOW (ref 96–108)
Creatinine: 0.68 mg/dL (ref 0.51–0.95)
GFR,Black: 102 *
GFR,Caucasian: 89 *
Glucose: 113 mg/dL — ABNORMAL HIGH (ref 60–99)
Lab: 20 mg/dL (ref 6–20)
Potassium: 5 mmol/L (ref 3.3–5.1)
Sodium: 137 mmol/L (ref 133–145)
Total Protein: 6 g/dL — ABNORMAL LOW (ref 6.3–7.7)

## 2016-10-06 LAB — MAGNESIUM: Magnesium: 1.5 mEq/L (ref 1.3–2.1)

## 2016-10-06 LAB — PHOSPHORUS: Phosphorus: 4.8 mg/dL — ABNORMAL HIGH (ref 2.7–4.5)

## 2016-10-06 MED ORDER — AMITRIPTYLINE HCL 25 MG PO TABS *I*
25.0000 mg | ORAL_TABLET | Freq: Every evening | ORAL | Status: DC
Start: 2016-10-06 — End: 2016-10-14
  Administered 2016-10-06 – 2016-10-13 (×8): 25 mg via ORAL
  Filled 2016-10-06 (×9): qty 1

## 2016-10-06 MED ORDER — METOCLOPRAMIDE HCL 5 MG/ML IJ SOLN *I*
5.0000 mg | Freq: Four times a day (QID) | INTRAMUSCULAR | Status: DC | PRN
Start: 2016-10-06 — End: 2016-10-08

## 2016-10-06 NOTE — Plan of Care (Signed)
Assumed care of patient [phone].  VSS throughout shift.  Please refer to patient flowsheets and eMAR  for further assessments and documentation.    Izaias Krupka Farrell, RN      Cognitive function    • Cognitive function will be maintained or return to baseline Maintaining        Mobility    • Functional status is maintained or improved - Geriatric Maintaining        Nutrition    • Nutritional status is maintained or improved - Geriatric Maintaining        Pain/Comfort    • Patient's pain or discomfort is manageable Maintaining        Psychosocial    • Demonstrates ability to cope with illness Maintaining        Safety    • Patient will remain free of falls Maintaining

## 2016-10-06 NOTE — Progress Notes (Signed)
Palliative Care Progress Note    Subjective: Again with low RR of 9 and increased lethargy overnight on dilaudid PCA. Basal reduced to 0.25mg /hr with 0.5mg  bolus Q54min PRN from 0.7mg /hr with matched boluses.    Amanda Galloway is feeling much better today than she was yesterday. Her pain was about a 4/10 for most of the morning so she did a lot of walking and spent the morning in the courtyard with family and is now having about 7/10 pain. Mostly LUQ and RLQ. She is stooling normally and feels like she is in pretty good spirits considering all that she has been through. She does acknowledge anxiety about pain and worry that she will have to be readmitted if it is not controlled well.      Total use yesterday: 32mg  IV dilaudid     Palliative Care ROS:     Physical Examination:   BP: (90-120)/(58-70)   Temp:  [36.2 C (97.2 F)-37.2 C (99 F)]   Temp src: Temporal (08/04 0803)  Heart Rate:  [54-81]   Resp:  [8-16]   SpO2:  [92 %-99 %]   General appearance: Laying in bed, alert and conversant, in NAD.  Lungs: CTAB, easy WOB.  Heart: RRR, no murmur  Abdomen: Soft , mild tender over lap sites, active BS all quadrants.   Extremities: palpable pulses, w/o edema  Skin:warm and dry.  Right IJ dsg D+I, medi port accessed. Lap sites with derma bond   Neurologic: Alert + O x 3    Assessment/Plan:  Patient is a 71 y.o.female w hypothyroidism, fibromyalgia, s/p cholecystectomy who was a transfer from OSH on 09/29/16 for workup for a pancreatic massand pain control. Palliative care consult called by primary team to address pain control. 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 Currently status post IR right celiac plexus block 8/1 and 8/2 w ex lap and medi port placement. Planned for chemo to start 8/3. Pain not controlled currently and unable to recommend oral basal regime as planned.     Pain  - Pain currently under control with 0.25mg  cont/0.5mg  PRN Q30 min    - Continue this through tomorrow and, assuming pain remains well  controlled, will recommend transition plan tomorrow to PO    - encourage tylenol 650 mg q 6 hrs  - continue decadron 4 mg bid as likely beneficial to reduce inflammation and visceral pain at this time.   - Lyrica discontinued - recommend amitriptyline as outlined below  - recommend f/u with Palliative care as an outpatient.    Constipation/Elimination  - continue colace , senna with hold parameters for loose stool.   - continue increased ambulation.     Nausea  -has been controlled but agree with prn zofran with reglan as second line therapy.  -encouraged frequent small meals which has helped along with the pain control.    Mood/pain  - Amitriptyline 25mg  QHS (patient has tried cymbalta in the past that didn't work for her)    Will continue to follow for emotional support and pain control. Daughter and patient coping well at present time. Sharee plans to stay in [place]  for chemotherapy. Son returned to [place] with his wife.     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    Malignant neoplasm of pancreas C25.9       No Known Allergies (drug, envir, food or latex)    Scheduled  Meds:    dexamethasone PF  10 mg Intravenous Q24H    Fluorouracil (EFUDEX) infusion  950 mg/m2 (Treatment Plan Recorded) Intravenous Q23H    enoxaparin  40 mg Subcutaneous Daily    PROSOURCE NO CARB  30 mL Oral BID WC    senna  2 tablet Oral 2 times per day    polyethylene glycol  17 g Oral 2 times per day    dexamethasone  4 mg Oral BID    pantoprazole  40 mg Oral QAM    aspirin  81 mg Oral Daily    Vitamins - senior  1 tablet Oral Daily    docusate sodium  200 mg Oral 2 times per day    triamterene-hydrochlorothiazide  1 tablet Oral Daily    potassium chloride SA  40 mEq Oral Daily    levothyroxine  137 mcg Oral Daily       Continuous Infusions:    dextrose 30 mL/hr (10/05/16 1545)    dextrose      sodium chloride 75 mL/hr (10/06/16 0930)     HYDROmorphone         PRN Meds:  metoclopramide IV, dextrose, dextrose, prochlorperazine, ondansetron, acetaminophen

## 2016-10-06 NOTE — Progress Notes (Signed)
Assumed care of patient 1900-0700. Chemotherapy administered per order via IVAD. Pt tolerating well.    Pt was very tearful during the evening & expressed how overwhelmed she is by the last two weeks. Writer provided emotional support.     VSS throughout shift, except respiratory rate which reached 8-9/min during the night. Provider was notified and PCA continuous dose decreased per order @ 0138 with positive effect. When she woke up this AM to go to the bathroom, pt told writer that last night was her "best night so far". Rated her pain 6/10 and pushed her PCA button with adequate relief.    However, pt has stated she is very worried she will "forget to press the [PCA] button" and that her pain will suddenly be out of control. She keeps the IV pump facing her during the night so she can see it and "not miss a dose". She has many questions about how she will go home and be able to manage her pain without the IV. Provider and oncoming RN made aware.    Bed alarm engaged for pt safety.    Will continue to monitor.    Please refer to patient flowsheets and eMAR  for further assessments and documentation.    Lucky Cowboy, RN

## 2016-10-06 NOTE — Progress Notes (Signed)
Hematology/Oncology Progress Note:      LOS: 7 days     HPI: [name] who was a transfer from OSH on 09/29/16 for workup for a pancreatic mass and pain control.    Interval Hx/Subjective:    - VSS, afebrile   - pain is okay with decreased PCA basal rate but still present  - discussed using visual distractions and heat packs for pain   - she shared that at discharge she will be living with her daughter  - discussed need to transition to PO meds to be discharged, and will re-address daily   - emotional about new diagnosis, comforted and daughter at bedside     Review of Systems   Constitutional: Positive for malaise/fatigue. Negative for chills, fever and weight loss.   Respiratory: Positive for shortness of breath. Negative for cough.    Cardiovascular: Negative for chest pain and leg swelling.   Gastrointestinal: Positive for abdominal pain, constipation and nausea. Negative for diarrhea and vomiting.   Musculoskeletal: Positive for back pain.   Neurological: Positive for weakness.     Objective:     Blood pressure 110/60, pulse 54, temperature 36.5 C (97.7 F), temperature source Temporal, resp. rate 12, height 1.68 m (5' 6.14"), weight 71.3 kg (157 lb 3 oz), SpO2 99 %.    Physical Exam  Gen: Pale and appears fatigued, NAD, emotional   CV: RRR  Chest: CTA B/L  Abd: hypoactive BS, soft and tender in LUQ/epigastric region  Ext: WWP, +pedal pulses, no edema  Neuro: AAOx3, CNs grossly intact, no focal deficits    Assessment/Plan:     Patient is a 71 y.o.  female who has a newly found pancreatic mass and was transferred to [facility] from an OSH on 09/29/2016 for further workup and pain control. HPB consutled and not a surgical candidate at this time. IR celiac block 8/1 and Dr. Ron Agee placed mediport and performed ex lap 8/2. cycle 1 FOLFIRINOX today 8/3, palliative care to consult again.     New pancreatic head mass  - has not established care with an oncologist yet (likely will be Dr. Sharlyn Bologna)  - has not  received treatment yet  - biopsy at [facility] is in patient hard chart: positive for malignancy-adenocarcinoma   - obtaing staging Ct abd/pelvis:  Ill defined tissue which abuts the SMA  - appreciate HPB following  - [id number]  - s/p mediport placement and ex lap 8/2 with Dr, Ron Agee- no metastatic disease found  - cycle 1 of FOLFIRINOX today 8/3  - will need neulasta 24-72 hrs after infusion complete    Pain  - tylenol prn  - appreciate PC following  - PCA to 0.25mg /hr and 0.5mg  q73mins- decreased O/N on 8/4 due to RR 9, will need to address with palliative care  - s/p IR celiac block 8/1  - encourage heat packs, and visual distractions   - palliative care to address need for antidepressant therapy     Hypoxia  - CT angio- negative for PE, possible signs of aspiration  - incentive spirometry    HTN/HLD  - cont home ASA  - cont maxzide (pt takes this for edema - has been on this long term)    Hypothyroidism  - cont synthroid    Constipation/PUD ppx/Nausea  - Colace BID  - senna BID  - Miralax BID  - lactulose x3  - Zofran PRN  - protonix daily  - reglan prn  DVT/PPX:   - Lovenox SQ     FEN:  - PO  - Labs daily, replete PRN  - regular diet    PT consult RTPL w/ home PT    Full code    DISPO: Pending above  - Follow up with: needs to be set up with Dr. Sharlyn Bologna  - Tentative Discharge Date: unknown  - Rx/Equipment Needed: TBD    Author: Hurman Horn, NP  as of: 10/06/2016  at: 6:53 AM

## 2016-10-07 ENCOUNTER — Other Ambulatory Visit: Payer: Self-pay | Admitting: Cardiology

## 2016-10-07 DIAGNOSIS — I499 Cardiac arrhythmia, unspecified: Secondary | ICD-10-CM

## 2016-10-07 LAB — CBC AND DIFFERENTIAL
Baso # K/uL: 0 10*3/uL (ref 0.0–0.1)
Basophil %: 0.1 %
Eos # K/uL: 0 10*3/uL (ref 0.0–0.4)
Eosinophil %: 0 %
Hematocrit: 32 % — ABNORMAL LOW (ref 34–45)
Hemoglobin: 10.5 g/dL — ABNORMAL LOW (ref 11.2–15.7)
IMM Granulocytes #: 0.1 10*3/uL (ref 0.0–0.1)
IMM Granulocytes: 0.5 %
Lymph # K/uL: 1.7 10*3/uL (ref 1.2–3.7)
Lymphocyte %: 15.6 %
MCH: 32 pg/cell (ref 26–32)
MCHC: 33 g/dL (ref 32–36)
MCV: 96 fL — ABNORMAL HIGH (ref 79–95)
Mono # K/uL: 0.9 10*3/uL (ref 0.2–0.9)
Monocyte %: 7.8 %
Neut # K/uL: 8.3 10*3/uL — ABNORMAL HIGH (ref 1.6–6.1)
Nucl RBC # K/uL: 0 10*3/uL (ref 0.0–0.0)
Nucl RBC %: 0 /100 WBC (ref 0.0–0.2)
Platelets: 430 10*3/uL — ABNORMAL HIGH (ref 160–370)
RBC: 3.3 MIL/uL — ABNORMAL LOW (ref 3.9–5.2)
RDW: 13.2 % (ref 11.7–14.4)
Seg Neut %: 76 %
WBC: 11 10*3/uL — ABNORMAL HIGH (ref 4.0–10.0)

## 2016-10-07 LAB — COMPREHENSIVE METABOLIC PANEL
ALT: 31 U/L (ref 0–35)
AST: 19 U/L (ref 0–35)
Albumin: 3.6 g/dL (ref 3.5–5.2)
Alk Phos: 70 U/L (ref 35–105)
Anion Gap: 11 (ref 7–16)
Bilirubin,Total: <0.2 mg/dL (ref 0.0–1.2)
CO2: 28 mmol/L (ref 20–28)
Calcium: 9.2 mg/dL (ref 8.6–10.2)
Chloride: 97 mmol/L (ref 96–108)
Creatinine: 0.68 mg/dL (ref 0.51–0.95)
GFR,Black: 102 *
GFR,Caucasian: 89 *
Glucose: 98 mg/dL (ref 60–99)
Lab: 25 mg/dL — ABNORMAL HIGH (ref 6–20)
Potassium: 4.6 mmol/L (ref 3.3–5.1)
Sodium: 136 mmol/L (ref 133–145)
Total Protein: 5.6 g/dL — ABNORMAL LOW (ref 6.3–7.7)

## 2016-10-07 LAB — PHOSPHORUS: Phosphorus: 4.5 mg/dL (ref 2.7–4.5)

## 2016-10-07 LAB — MAGNESIUM: Magnesium: 1.6 mEq/L (ref 1.3–2.1)

## 2016-10-07 MED ORDER — METHADONE HCL 5 MG PO TABS *I*
10.0000 mg | ORAL_TABLET | Freq: Three times a day (TID) | ORAL | Status: DC
Start: 2016-10-07 — End: 2016-10-09
  Administered 2016-10-07 – 2016-10-09 (×6): 10 mg via ORAL
  Filled 2016-10-07 (×6): qty 2

## 2016-10-07 MED ORDER — HYDROMORPHONE HCL 2 MG/ML IJ SOLN *WRAPPED*
0.5000 mg | Freq: Once | INTRAMUSCULAR | Status: AC
Start: 2016-10-07 — End: 2016-10-07
  Administered 2016-10-07: 0.5 mg via INTRAVENOUS
  Filled 2016-10-07: qty 1

## 2016-10-07 NOTE — Plan of Care (Signed)
Assumed care of patient [phone].  VSS throughout shift.  Please refer to patient flowsheets and eMAR  for further assessments and documentation.    Hennie Duos, RN      Cognitive function     Cognitive function will be maintained or return to baseline Maintaining        Mobility     Functional status is maintained or improved - Geriatric Maintaining        Nutrition     Nutritional status is maintained or improved - Geriatric Maintaining        Psychosocial     Demonstrates ability to cope with illness Maintaining        Safety     Patient will remain free of falls Maintaining          Pain/Comfort     Patient's pain or discomfort is manageable Progressing towards goal

## 2016-10-07 NOTE — Progress Notes (Signed)
Assumed care of patient 1900-0700. VSS throughout shift.     Chemotherapy delayed two hours (drug had arrived from pharmacy not protected from light & pharmacy was not able to ensure it was safe to administer). A new bag was made & delivered from pharmacy and administered per orders. Pt tolerating well.     Pt slept most of the night with no complaints of pain, but woke up in the morning with 10/10 pain. She stated "being able to sleep all night is not worth the pain I have when I wake up." Provider made aware & x1 dose of IV dilaudid was given per order on top of her PCA dose. This had a moderate effect- pain now 8/10. Pt states she is glad that she is able to sit up/lay down without severe pain. Will continue to monitor.    Please refer to patient flowsheets and eMAR  for further assessments and documentation.    Lucky Cowboy, RN

## 2016-10-07 NOTE — Plan of Care (Addendum)
Cognitive function     Cognitive function will be maintained or return to baseline Maintaining    Patient is AO3    Mobility     Functional status is maintained or improved - Geriatric Maintaining    Patient ambulates in hall and outside with family    Nutrition     Nutritional status is maintained or improved - Geriatric Maintaining        Pain/Comfort     Patient's pain or discomfort is manageable Maintaining    PCA and methadone started on 8/5    Psychosocial     Demonstrates ability to cope with illness Maintaining    Overall anxiety regarding recurrence of pain    Safety     Patient will remain free of falls Maintaining    Patient exhibits safety awareness    Assumed care of patient at 1900 to 0700. Patient's VSS throughout shift.  Patient remains on PCA without continuous, however, Methadone started on 8/5.  Patient's pain has remained tolerable between a 3-4/10 throughout shift.    Addendum:  Patient awoke to go to bathroom and pressed PCA prior to movement.  After returning from bathroom patient experienced sharp pain 8/10 and asked for something for breakthrough pain.  Dilaudid 0.5 mg given with good effect.    Corinda Gubler, RN  5:48 AM

## 2016-10-07 NOTE — Progress Notes (Addendum)
Palliative Care Progress Note    Subjective: Slept most of the night comfortably but woke up in 10/10 pain. Used PCA bolus and got IV dilaudid x1 on top of that. During my visit she says her pain is still an 8/10 and now she's afraid to sleep for too long for fear of losing ground with her pain. We discussed transitioning to oral meds over the next few days and she admitted to being very afraid of pain she might feel in the process. I discussed the step-wise approach we use to take her off the PCA and that we wouldn't dent her home unless her pain was controlled. She said she thinks people believe that she is faking her pain so she doesn't have to go home.  She talked at length about how stressful the last few months have been even before she learned of her diagnosis and she constantly has so much on her mind.     3/4: ~30mg  IV dilaudid   3/3: 32mg  IV dilaudid    Palliative Care ROS: See above    Physical Examination:   BP: (106-131)/(58-80)   Temp:  [36 C (96.8 F)-36.8 C (98.2 F)]   Temp src: Temporal (08/05 0815)  Heart Rate:  [66-73]   Resp:  [12-16]   SpO2:  [96 %-99 %]   General appearance: Laying in bed, alert and conversant, in NAD.  Lungs: CTAB, easy WOB.  Heart: RRR, no murmur  Abdomen: Soft , mild diffuse tenderness, active BS all quadrants.   Extremities: palpable pulses, w/o edema  Skin:warm and dry.  Right IJ dsg D+I, medi port accessed. Lap sites with derma bond   Neurologic: Alert + O x 3    Assessment/Plan:  Patient is a 71 y.o.female w hypothyroidism, fibromyalgia, s/p cholecystectomy who was a transfer from OSH on 09/29/16 for workup for a pancreatic massand pain control. Palliative care consult called by primary team to address pain control. 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 Currently status post IR right celiac plexus block 8/1 and 8/2 w ex lap and medi port placement. Working on transitioning from [place] to oral pain regimen. I think she would be a good candidate for methadone but I dont  see any EKGs in her chart.     Pain  - Pain currently under control with 0.25mg  cont/0.5mg  PRN Q30 min   - Using about 30mg  IV dilaudid daily over the last few days    = 600mg  PO morphine    = 10 TID Methadone   (= 149mcg Fentanyl patch)  - Obtain EKG to assess QTc  - Once QTc has been assessed and pain has not increased by later this afternoon would recommend discontinuing basal rate on PCA and starting Methadone 10 mg TID with orders to hold for sedation. Would keep PCA bolus dose as is for next few days as methadone reaches steady state.      - encourage tylenol 650 mg q 6 hrs  - continue decadron 4 mg bid as likely beneficial to reduce inflammation and visceral pain at this time.   - Lyrica discontinued - recommend amitriptyline as outlined below  - needs f/u with Palliative care as an outpatient.    Constipation/Elimination  - continue colace , senna with hold parameters for loose stool.   - continue increased ambulation.     Nausea  -has been controlled but agree with prn zofran with reglan as second line therapy.  -encouraged frequent small meals which has helped along with the  pain control.    Mood/pain  - Amitriptyline 25mg  QHS (patient has tried cymbalta in the past that didn't work for her)    Will continue to follow for emotional support and pain control. Daughter and patient coping well at present time. Anysia plans to stay in [place]  for chemotherapy. Son returned to [place] with his wife.     Cherie Dark, MD  10/07/2016 10:14 AM     Total Time Spent 45 minutes:   >50% of time was spent in counseling and/or coordination of care.    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    Malignant neoplasm of pancreas C25.9       No Known Allergies (drug, envir, food or latex)    Scheduled Meds:    amitriptyline  25 mg Oral Nightly    dexamethasone PF  10 mg Intravenous Q24H    Fluorouracil (EFUDEX) infusion  950 mg/m2 (Treatment Plan Recorded) Intravenous Q23H     enoxaparin  40 mg Subcutaneous Daily    PROSOURCE NO CARB  30 mL Oral BID WC    senna  2 tablet Oral 2 times per day    polyethylene glycol  17 g Oral 2 times per day    dexamethasone  4 mg Oral BID    pantoprazole  40 mg Oral QAM    aspirin  81 mg Oral Daily    Vitamins - senior  1 tablet Oral Daily    docusate sodium  200 mg Oral 2 times per day    triamterene-hydrochlorothiazide  1 tablet Oral Daily    potassium chloride SA  40 mEq Oral Daily    levothyroxine  137 mcg Oral Daily       Continuous Infusions:    sodium chloride 75 mL/hr (10/06/16 2151)    HYDROmorphone         PRN Meds:  metoclopramide IV, prochlorperazine, ondansetron, acetaminophen

## 2016-10-07 NOTE — Progress Notes (Signed)
Hematology/Oncology Progress Note:      LOS: 8 days     HPI: [name] who was a transfer from OSH on 09/29/16 for workup for a pancreatic mass and pain control.    Interval Hx/Subjective:    - VSS, afebrile   - pain was okay overnight, but is 10/10 this morning  - discussed transitioning to PO long acting dose  - pt shared her fears, and anxiety about tx and pain control   - shared that she is having some difficulty swallowing, swallow eval pending, will continue reg diet     Review of Systems   Constitutional: Positive for malaise/fatigue. Negative for chills, fever and weight loss.   Respiratory: Negative for cough and shortness of breath.    Cardiovascular: Negative for chest pain and leg swelling.   Gastrointestinal: Positive for abdominal pain. Negative for constipation, diarrhea, nausea and vomiting.   Musculoskeletal: Positive for back pain.   Neurological: Positive for weakness.     Objective:     Blood pressure 131/80, pulse 72, temperature 36.7 C (98.1 F), temperature source Temporal, resp. rate 16, height 1.68 m (5' 6.14"), weight 71.3 kg (157 lb 3 oz), SpO2 99 %.    Physical Exam  Gen: Pale and appears fatigued, NAD, emotional, grasping left rib cage in pain   CV: RRR  Chest: CTA B/L  Abd: hypoactive BS, increasingly distended, tender and "hard" to palpation  Ext: WWP, +pedal pulses, no edema  Neuro: AAOx3, CNs grossly intact, no focal deficits    Assessment/Plan:     Patient is a 71 y.o.  female who has a newly found pancreatic mass and was transferred to [facility] from an OSH on 09/29/2016 for further workup and pain control. HPB consutled and not a surgical candidate at this time. IR celiac block 8/1 and Dr. Ron Agee placed mediport and performed ex lap 8/2. cycle 1 FOLFIRINOX today 8/3, palliative care to follow.     New pancreatic head mass  - has not established care with an oncologist yet (likely will be Dr. Sharlyn Bologna)  - has not received treatment yet  - biopsy at [facility] is in patient hard  chart: positive for malignancy-adenocarcinoma   - obtaing staging Ct abd/pelvis:  Ill defined tissue which abuts the SMA  - appreciate HPB following  - [id number]  - s/p mediport placement and ex lap 8/2 with Dr, Ron Agee- no metastatic disease found  - cycle 1 of FOLFIRINOX today 8/3  - will need neulasta 24-72 hrs after infusion complete    Pain  - tylenol prn  - appreciate PC following  - PCA to 0.25mg /hr and 0.5mg  q37mins  - EKG pending  - pending qtc and pain: will d/c basal PCA and add methadone 10 TID  - s/p IR celiac block 8/1  - encourage heat packs, and visual distractions   - amtriptyline qhs     Hypoxia  - CT angio- negative for PE, possible signs of aspiration  - incentive spirometry    HTN/HLD  - cont home ASA  - cont maxzide (pt takes this for edema - has been on this long term)    Hypothyroidism  - cont synthroid    Constipation/PUD ppx/Nausea  - Colace BID  - senna BID  - Miralax BID  - lactulose x3  - Zofran PRN  - protonix daily  - reglan prn  - swallow eval pending     DVT/PPX:   - Lovenox SQ  FEN:  - PO  - Labs daily, replete PRN  - regular diet per attending     PT consult RTPL w/ home PT    Full code    DISPO: Pending above  - Follow up with: needs to be set up with Dr. Sharlyn Bologna  - Tentative Discharge Date: unknown  - Rx/Equipment Needed: TBD    Author: Hurman Horn, NP  as of: 10/07/2016  at: 6:33 AM

## 2016-10-08 LAB — MAGNESIUM: Magnesium: 1.6 mEq/L (ref 1.3–2.1)

## 2016-10-08 LAB — COMPREHENSIVE METABOLIC PANEL
ALT: 32 U/L (ref 0–35)
AST: 21 U/L (ref 0–35)
Albumin: 3.5 g/dL (ref 3.5–5.2)
Alk Phos: 65 U/L (ref 35–105)
Anion Gap: 10 (ref 7–16)
Bilirubin,Total: 0.2 mg/dL (ref 0.0–1.2)
CO2: 27 mmol/L (ref 20–28)
Calcium: 9.1 mg/dL (ref 8.6–10.2)
Chloride: 99 mmol/L (ref 96–108)
Creatinine: 0.66 mg/dL (ref 0.51–0.95)
GFR,Black: 103 *
GFR,Caucasian: 90 *
Glucose: 99 mg/dL (ref 60–99)
Lab: 24 mg/dL — ABNORMAL HIGH (ref 6–20)
Potassium: 4.8 mmol/L (ref 3.3–5.1)
Sodium: 136 mmol/L (ref 133–145)
Total Protein: 5.7 g/dL — ABNORMAL LOW (ref 6.3–7.7)

## 2016-10-08 LAB — CBC AND DIFFERENTIAL
Baso # K/uL: 0 10*3/uL (ref 0.0–0.1)
Basophil %: 0.1 %
Eos # K/uL: 0 10*3/uL (ref 0.0–0.4)
Eosinophil %: 0 %
Hematocrit: 31 % — ABNORMAL LOW (ref 34–45)
Hemoglobin: 10.2 g/dL — ABNORMAL LOW (ref 11.2–15.7)
IMM Granulocytes #: 0 10*3/uL (ref 0.0–0.1)
IMM Granulocytes: 0.3 %
Lymph # K/uL: 1.7 10*3/uL (ref 1.2–3.7)
Lymphocyte %: 15.1 %
MCH: 32 pg/cell (ref 26–32)
MCHC: 33 g/dL (ref 32–36)
MCV: 96 fL — ABNORMAL HIGH (ref 79–95)
Mono # K/uL: 0.4 10*3/uL (ref 0.2–0.9)
Monocyte %: 3.5 %
Neut # K/uL: 9.3 10*3/uL — ABNORMAL HIGH (ref 1.6–6.1)
Nucl RBC # K/uL: 0 10*3/uL (ref 0.0–0.0)
Nucl RBC %: 0 /100 WBC (ref 0.0–0.2)
Platelets: 418 10*3/uL — ABNORMAL HIGH (ref 160–370)
RBC: 3.2 MIL/uL — ABNORMAL LOW (ref 3.9–5.2)
RDW: 13.3 % (ref 11.7–14.4)
Seg Neut %: 81 %
WBC: 11.4 10*3/uL — ABNORMAL HIGH (ref 4.0–10.0)

## 2016-10-08 LAB — PHOSPHORUS: Phosphorus: 3.9 mg/dL (ref 2.7–4.5)

## 2016-10-08 MED ORDER — HYDROMORPHONE HCL 2 MG/ML IJ SOLN *WRAPPED*
0.5000 mg | Freq: Once | INTRAMUSCULAR | Status: AC
Start: 2016-10-08 — End: 2016-10-08
  Administered 2016-10-08: 0.5 mg via INTRAVENOUS
  Filled 2016-10-08: qty 1

## 2016-10-08 MED ORDER — PANTOPRAZOLE SODIUM 40 MG PO TBEC *I*
40.0000 mg | DELAYED_RELEASE_TABLET | Freq: Two times a day (BID) | ORAL | Status: DC
Start: 2016-10-08 — End: 2016-10-14
  Administered 2016-10-08 – 2016-10-14 (×12): 40 mg via ORAL
  Filled 2016-10-08 (×11): qty 1

## 2016-10-08 MED ORDER — PROCHLORPERAZINE MALEATE 10 MG PO TABS *I*
10.0000 mg | ORAL_TABLET | Freq: Four times a day (QID) | ORAL | Status: DC | PRN
Start: 2016-10-08 — End: 2016-10-14

## 2016-10-08 MED ORDER — METOCLOPRAMIDE HCL 5 MG PO TABS *I*
5.0000 mg | ORAL_TABLET | Freq: Three times a day (TID) | ORAL | Status: DC
Start: 2016-10-08 — End: 2016-10-14
  Administered 2016-10-08 – 2016-10-14 (×17): 5 mg via ORAL
  Filled 2016-10-08 (×22): qty 1

## 2016-10-08 NOTE — Progress Notes (Signed)
Physical Therapy Treatment Note    Amanda Galloway currently requires 1 assist for mobility. Anticipate patient will need 1 more PT visit to allow return to prior living environment. Discharge Recommendations: (P) Anticipate return to prior living arrangement, Home PT after discharge from the hospital.     PT Discharge Equipment Recommended: (P) To be determined   10/08/16 1530   PT Tracking   PT TRACKING PT Assigned   Visit Number   Visit Number [facility]) / Treatment Day ([place]) 3  (Pt's daughter present and supportive t/o. )   Precautions/Observations   Precautions used Yes   LDA Observation IV lines;PCA   Fall Precautions General falls precautions   Other Pt received in the bathroom standing cleaning the sink w/o LOB.    Pain Assessment   *Is the patient currently in pain? Yes   Pain (Before,During, After) Therapy During   0-10 Scale (Pt did not quantify)   Pain Location Abdomen   Pain Intervention(s) Refer to nursing for pain management;Repositioned   Cognition   Arousal/Alertness Appropriate responses to stimuli   Following Commands Follows simple commands without difficulty;Follows simple commands consistently   Additional Comments Pt easily frustrated w/ Probation officer requests.  Able to continue session w/ increased time.  Pt endorsed frustration w/ current medical situation and reports, "I was fine before, I'm fine now."   Bed Mobility   Bed mobility Not tested   Transfers   Transfers Tested   Sit to Stand Independent   Stand to sit Independent   Transfer Assistive Device none   Additional comments Up from the couch x 10 reps consecutively w/ good eccentric control t/o.    Mobility   Mobility Tested   Gait Pattern (Mildly unsteady)   Ambulation Assist Stand by   Ambulation Distance (Feet) ~400, 10'x2   Ambulation Assistive Device None  (IV pole for ~ 100')   Additional comments Pt initially ambulating w/ the IV Pole w/ modI.  With maximum encouragement pt hastily agreed to ambulate w/o the IV pole while writer managed. Pt  ambulated an additional 300' w/o any overt LOB. Intermittently she reached for the chair rail on the hallway but w/o LOB t/o.    Therapeutic Exercises   Hip Flexion, Standing Bilaterally   Hip Flexion, Standing-Assist Active   Hip Flexion, Standing-Reps 10   Mini Squats-Reps 10   Additional comments Pt also completed calf raises x 10 reps, seated marches, LAQ's and 10 sit>stands.  With marches pt had a posterior LOB x 3 reps which Probation officer corrected w/ CGA.  Pt unable to tolerate additional therex or balance traning d/t frustration w/ current situation.    Balance   Balance Tested   Sitting - Static Independent ;Unsupported   Standing - Static Other (comment);Unsupported  (SBA)   Standing - Dynamic Other (Comment);Unsupported  (SBA)   PT AM-PAC Mobility   Turning over in bed? 1   Sitting down on and standing up from a chair with arms? 3   Moving from lying on back to sitting on the side of the bed? 1   Moving to and from a bed to a chair? 3   Need to walk in hospital room? 3   Climbing 3 - 5 steps with a railing? 1   Total Raw Score 12   Standardized Score 35.33   CMS 1-100% Score 69   Assessment   Brief Assessment Remains appropriate for skilled therapy   Patient / Family Goal To go home to her daughters townhouse.  Additional Comments At this time pt is progressing well toward home d/c. Will assses 2 steps w/ 0 railing to enter/exit the home in next session.  Pt would benefit from Home PT to address higher level balance as pt presents w/ poor single leg stance balance t/o.    Plan/Recommendation   Treatment Interventions Restorative PT   PT Frequency 3-5x/wk   Hospital Stay Recommendations Out of bed with nursing assist;Ambulate daily with nursing assist;May be out of bed with family assist;May ambulate with family assist;Home care referral  (1A w/o a device)   Discharge Recommendations Anticipate return to prior living arrangement;Home PT   PT Discharge Equipment Recommended To be determined    Assessment/Recommendations Reviewed With: Patient;Family;Nursing   Next Visit Stairs; higher level balance.   Time Calculation   PT Timed Codes 15   PT Untimed Codes 0   PT Unbilled Time 0   PT Total Treatment 15   Plan and Onset date   Plan of Care Date 09/30/16   Onset Date 09/29/16   Treatment Start Date 09/30/16   PT Charges   $PT [facility] Charges Gait Training - code 0208 (5min)   Angelena Sole, PTA  Pager [phone]

## 2016-10-08 NOTE — Progress Notes (Signed)
Assumed care of patient 0700-1900. VSS throughout shift. Pt states abdominal pain 4/10 on dilaudid PCA. NS infusing @ 75cc/hr. Swallow eval done at bedside. Ambulated around unit x1. Please refer to patient flowsheets and eMAR  for further assessments and documentation.  Kerry Fort, RN

## 2016-10-08 NOTE — Progress Notes (Signed)
Hematology/Oncology Progress Note:      LOS: 9 days     HPI: [name] who was a transfer from OSH on 09/29/16 for workup for a pancreatic mass and pain control.    Interval Hx/Subjective:    - VSS, afebrile   - did not sleep well lastnight, pain was present but anxious about discontinuing continuous dilaudid pca   - discussed transition to methadone will take a few days, and to hit PCA  Button when she needs extra doses  - shared that she is having some anxiety about process and all being new to her   - daughter at bedside     Review of Systems   Constitutional: Positive for malaise/fatigue. Negative for chills, fever and weight loss.   Respiratory: Negative for cough and shortness of breath.    Cardiovascular: Negative for chest pain and leg swelling.   Gastrointestinal: Positive for abdominal pain. Negative for constipation, diarrhea, nausea and vomiting.   Musculoskeletal: Positive for back pain.   Neurological: Positive for weakness.     Objective:     Blood pressure 130/78, pulse 74, temperature 36.5 C (97.7 F), temperature source Temporal, resp. rate 20, height 1.68 m (5' 6.14"), weight 71.3 kg (157 lb 3 oz), SpO2 99 %.    Physical Exam  Gen: Pale and appears fatigued, grasping b/l Upper abdominal quadrants in pain  CV: RRR  Chest: CTA B/L  Abd: hypoactive BS, distended, tender and "hard" to palpation  Ext: WWP, +pedal pulses, no edema  Neuro: AAOx3, CNs grossly intact, no focal deficits    Assessment/Plan:     Patient is a 71 y.o.  female who has a newly found pancreatic mass and was transferred to [facility] from an OSH on 09/29/2016 for further workup and pain control. HPB consutled and not a surgical candidate at this time. IR celiac block 8/1 and Dr. Ron Agee placed mediport and performed ex lap 8/2. cycle 1 FOLFIRINOX  Finished 8/5, palliative care to follow.     New pancreatic head mass  - has not established care with an oncologist yet (likely will be Dr. Sharlyn Bologna)  - has not received  treatment yet  - biopsy at [facility] is in patient hard chart: positive for malignancy-adenocarcinoma   - obtaing staging Ct abd/pelvis:  Ill defined tissue which abuts the SMA  - appreciate HPB following  - [id number] [id number]  - s/p mediport placement and ex lap 8/2 with Dr, Ron Agee- no metastatic disease found  - cycle 1 of FOLFIRINOX finished 8/5  - will need neulasta 24-72 hrs after infusion complete    Pain  - tylenol prn  - appreciate PC following  - PCA 0.5mg  q36mins, no basal  - EKG qtc 434  - methadone 10mg  TID  - s/p IR celiac block 8/1  - encourage heat packs, and visual distractions   - amtriptyline qhs     Hypoxia  - CT angio- negative for PE, possible signs of aspiration  - incentive spirometry    HTN/HLD  - cont home ASA  - cont maxzide (pt takes this for edema - has been on this long term)    Hypothyroidism  - cont synthroid    Constipation/PUD ppx/Nausea  - Colace BID  - senna BID  - Miralax BID  - Zofran PRN  - protonix BID  - Reglan BID  - swallow eval: some regurg recommend reglan TID, protonix BID, thin liquids w/ reg diet    DVT/PPX:   -  Lovenox SQ     FEN:  - PO  - Labs daily, replete PRN  - regular diet per attending     PT consult RTPL w/ home PT    Full code    DISPO: Pending above  - Follow up with: needs to be set up with Dr. Sharlyn Bologna  - Tentative Discharge Date: unknown  - Rx/Equipment Needed: TBD    Author: Hurman Horn, NP  as of: 10/08/2016  at: 6:38 AM

## 2016-10-08 NOTE — Progress Notes (Cosign Needed)
Patient refused to ambulate with writer at this time. Patient stated that she walks with her daughter.

## 2016-10-08 NOTE — Plan of Care (Signed)
Cognitive function    • Cognitive function will be maintained or return to baseline Maintaining        Mobility    • Functional status is maintained or improved - Geriatric Maintaining        Safety    • Patient will remain free of falls Maintaining          Nutrition    • Nutritional status is maintained or improved - Geriatric Progressing towards goal        Pain/Comfort    • Patient's pain or discomfort is manageable Progressing towards goal        Psychosocial    • Demonstrates ability to cope with illness Progressing towards goal

## 2016-10-08 NOTE — Progress Notes (Signed)
CLINICAL EVALUATION OF SWALLOWING    Patient Name:Amanda Galloway  MRN: [id number]  Unit: [facility]       10/08/16 1409   Reason for referral   Referral Reason Assess safety for P.O. diet   BEDSIDE SWALLOW COMMENTS   History of Dysphagia Comments Pt and dtr report that for the past few months and especially weeks pt has been experiencing difficulty "swallowing" which upon greater investigation turns out to be complaints after she swallows, she feels food moves back up towards her throat and she tastes vomit. [name] is a 71 y.o. previously healthy F with recent diagnosis of locally advanced pancreatic uncinate adenocarcinoma.  She is currently on FOLFIRINOX inpatient chemotherapy started 10/06/15 while also working on uncontrolled pain.  She is currently on a PCA.  Some movements exacerbate this pain.  She is status post nerve block by IR 8/1 and exploratory laparotomy and MediPort placement on 8/2.        ASSESSMENT   Cognition No deficits identified   Alertness level Fully alert   Ability to follow directions Complex   Head and neck control Independent   Secretion Management Pink, moist   Reflexive cough Adequate   Voluntary Cough Adequate   Reflexive swallow Present   Voluntary swallow Present   Respiratory Status RA   Communication Status No deficits identified   Oral Mechanism   Oral Mechanism Tested X   Facial Symmetry Symmetrical   Dentition Natural dentition;Good condition   Palate Symmetrical rise   Tongue Protrusion Adequate   Tongue Retraction Adequate   Tongue Elevation Adequate   Tongue Lateralization Adequate   Tongue Strength Adequate   Lingual Coordination Adequate   Facial Sensation Intact   PO TRIAL CONSISTENCIES   P.O. Trials Tested X   Oral Ice chips Not impaired   Pharyngeal Ice Chips  No overt s/s of aspiration   Oral Thin Liquid Not impaired   Pharyngeal Thin Liquids No overt s/s of aspiration   Oral Puree Not impaired   Pharyngeal Puree No overt s/s of aspiration   Oral Soft Solid Not Impaired    Pharyngeal Soft Solid No overt s/s of aspiration   Oral Hard Solid Not Impaired   Pharyngeal Hard Solid No overt s/s of aspiration   Aspiration Precautions/Feeding Guidelines   Risk for Aspiration Low   Feeding Guidelines Upright 90 degrees   Medication instructions (as per pt preference)   Impressions   Severity Moderate   Type of Dysphagia Esophageal dysphagia  (r/o reflux/GI)   Summary 71 year old lady with new dx of pancreatic cancer and complaints of discomfort with PO. NO evidence of prandial aspiration. NO evidene of oropharyngeal dysphagia.     Pt's complaints are consistent with reflux symptoms. NP now aware. Consider GI consult as needed. Will sign off.                           Diet Recommendations   Diet Consistency Recommendation Regular Diet   Liquids Recommendations Thin liquids   Studies/Therapy Recommendations   Therapy Recommendations Esophagram vs. GI consult as per discretion of team   Frequency of Services Follow up not indicated   Objective Studies/ Recommendations  N/A   Studies/Therapy Recommendations   Patient Will Benefit From SLP services not warranted       Sander Radon, M.Ed., [place]   Speech Language Pathologist   Pager [phone]  [phone]

## 2016-10-08 NOTE — Progress Notes (Signed)
Palliative Care Progress Note    Objective: Pt visiting with friends at bedside. Smiling and conversant with visitors. Appears comfortable, less drowsy. Her pain still is worse in the am, states poor sleep during the night but still feels like she got behind.      Subjective: Transitioned off basal rate and started on methadone 10 mg tid and PCA dilaudid 0.5 mg  prn 20 minutes.      Palliative Care ROS: Kathline states pain better at port site. Still having sharp pain in left upper abdomen and right upper abdomen. Denies nausea and constipation, last BM yesterday am. Feels "spirits better after visit with long time friend visiting from [place]" but disappointed that block did not work.     Physical Examination:   BP: (110-132)/(64-78)   Temp:  [36 C (96.8 F)-37.3 C (99.1 F)]   Temp src: Temporal (08/06 1212)  Heart Rate:  [69-79]   Resp:  [18-20]   SpO2:  [96 %-100 %]   General appearance: Appears comfortable, sitting in bed. Smiling and engaged in conversation.  Lungs: CTA. Respirations easy, regular rate, and rhythm.  Heart: S1, S2 normal  Abdomen: Soft , mild tender over lap sites, active BS all quadrants, slight bloating.   Extremities: palpable pulses, w/o edema  Skin:warm and dry.  Right IJ dsg D+I, medi port accessed.   Neurologic: A + O x 3, fogginess resolved.      Assessment/Plan:  Patient is a 71 y.o.female w hypothyroidism, fibromyalgia, s/p cholecystectomy who was a transfer from OSH on 09/29/16 for workup for a pancreatic massand pain control. Palliative care consult called by primary team to address pain control. 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 Currently status post IR right celiac plexus block 8/1 and 8/2 w ex lap and medi port placement. Chemo  started 8/3.     Pain  - currently on dilaudid PCA 0.5 mg prn q 20 minutes.   Calculation of IV dilaudid used over previous 24 hrs was 20 mg.  She states her pain now 6/10.  Does not feel ready to transition to oral short acting meds.   - After  5 days, no relief from celiac block and inquiring if it can be "tried again".     -encourage tylenol 650 mg q 6 hrs  - continue decadron 4 mg bid as likely beneficial to reduce inflammation and visceral pain at this time.   - Lyrica stopped w/o affect.   -recommend f/u with Palliative care as an outpatient.    Constipation/Elimination  - continue colace , senna with hold parameters for loose stool.   - continue increased ambulation.       Nausea  -has been controlled but agree with prn zofran with reglan as second line therapy.  -encouraged frequent small meals which has helped along with the pain control.    Mood/pain  Amitriptyline 25 mg qhs.    Will continue to follow for emotional support and pain control. Daughter and patient coping well at present time. Maitlyn plans to stay in [place]  for chemotherapy. Son returned to [place] with his wife.     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    Malignant neoplasm of pancreas C25.9       No Known Allergies (drug, envir, food or latex)    Scheduled Meds:    metoclopramide  5 mg Oral TID AC  pantoprazole  40 mg Oral BID AC    methadone  10 mg Oral 3 times per day    amitriptyline  25 mg Oral Nightly    enoxaparin  40 mg Subcutaneous Daily    PROSOURCE NO CARB  30 mL Oral BID WC    senna  2 tablet Oral 2 times per day    polyethylene glycol  17 g Oral 2 times per day    dexamethasone  4 mg Oral BID    aspirin  81 mg Oral Daily    Vitamins - senior  1 tablet Oral Daily    docusate sodium  200 mg Oral 2 times per day    triamterene-hydrochlorothiazide  1 tablet Oral Daily    potassium chloride SA  40 mEq Oral Daily    levothyroxine  137 mcg Oral Daily       Continuous Infusions:    sodium chloride 75 mL/hr (10/08/16 0122)    HYDROmorphone         PRN Meds:  prochlorperazine, ondansetron, acetaminophen

## 2016-10-09 ENCOUNTER — Encounter: Payer: Self-pay | Admitting: Gastroenterology

## 2016-10-09 ENCOUNTER — Other Ambulatory Visit: Payer: Self-pay | Admitting: Cardiology

## 2016-10-09 ENCOUNTER — Ambulatory Visit: Payer: PRIVATE HEALTH INSURANCE

## 2016-10-09 ENCOUNTER — Inpatient Hospital Stay: Payer: PRIVATE HEALTH INSURANCE | Admitting: Audiology

## 2016-10-09 DIAGNOSIS — Z5189 Encounter for other specified aftercare: Secondary | ICD-10-CM | POA: Insufficient documentation

## 2016-10-09 DIAGNOSIS — Z5111 Encounter for antineoplastic chemotherapy: Secondary | ICD-10-CM | POA: Insufficient documentation

## 2016-10-09 DIAGNOSIS — R1310 Dysphagia, unspecified: Secondary | ICD-10-CM

## 2016-10-09 DIAGNOSIS — R109 Unspecified abdominal pain: Secondary | ICD-10-CM | POA: Insufficient documentation

## 2016-10-09 DIAGNOSIS — G4700 Insomnia, unspecified: Secondary | ICD-10-CM | POA: Insufficient documentation

## 2016-10-09 DIAGNOSIS — M545 Low back pain: Secondary | ICD-10-CM | POA: Insufficient documentation

## 2016-10-09 DIAGNOSIS — K219 Gastro-esophageal reflux disease without esophagitis: Secondary | ICD-10-CM

## 2016-10-09 DIAGNOSIS — C259 Malignant neoplasm of pancreas, unspecified: Secondary | ICD-10-CM | POA: Insufficient documentation

## 2016-10-09 LAB — CBC AND DIFFERENTIAL
Baso # K/uL: 0 10*3/uL (ref 0.0–0.1)
Basophil %: 0.1 %
Eos # K/uL: 0 10*3/uL (ref 0.0–0.4)
Eosinophil %: 0.4 %
Hematocrit: 32 % — ABNORMAL LOW (ref 34–45)
Hemoglobin: 10.6 g/dL — ABNORMAL LOW (ref 11.2–15.7)
IMM Granulocytes #: 0 10*3/uL (ref 0.0–0.1)
IMM Granulocytes: 0.4 %
Lymph # K/uL: 2.6 10*3/uL (ref 1.2–3.7)
Lymphocyte %: 33.1 %
MCH: 32 pg/cell (ref 26–32)
MCHC: 33 g/dL (ref 32–36)
MCV: 97 fL — ABNORMAL HIGH (ref 79–95)
Mono # K/uL: 0.2 10*3/uL (ref 0.2–0.9)
Monocyte %: 2.8 %
Neut # K/uL: 4.9 10*3/uL (ref 1.6–6.1)
Nucl RBC # K/uL: 0 10*3/uL (ref 0.0–0.0)
Nucl RBC %: 0 /100 WBC (ref 0.0–0.2)
Platelets: 443 10*3/uL — ABNORMAL HIGH (ref 160–370)
RBC: 3.3 MIL/uL — ABNORMAL LOW (ref 3.9–5.2)
RDW: 13.3 % (ref 11.7–14.4)
Seg Neut %: 63.2 %
WBC: 7.7 10*3/uL (ref 4.0–10.0)

## 2016-10-09 LAB — EKG 12-LEAD
P: 70 deg
P: 61 deg
PR: 160 ms
PR: 172 ms
QRS: 60 deg
QRS: 30 deg
QRSD: 74 ms
QRSD: 70 ms
QT: 360 ms
QT: 360 ms
QTc: 394 ms
QTc: 386 ms
Rate: 72 {beats}/min
Rate: 69 {beats}/min
Severity: NORMAL
Severity: NORMAL
T: 57 deg
T: 31 deg

## 2016-10-09 LAB — COMPREHENSIVE METABOLIC PANEL
ALT: 29 U/L (ref 0–35)
AST: 16 U/L (ref 0–35)
Albumin: 3.9 g/dL (ref 3.5–5.2)
Alk Phos: 67 U/L (ref 35–105)
Anion Gap: 13 (ref 7–16)
Bilirubin,Total: 0.3 mg/dL (ref 0.0–1.2)
CO2: 26 mmol/L (ref 20–28)
Calcium: 9.3 mg/dL (ref 8.6–10.2)
Chloride: 97 mmol/L (ref 96–108)
Creatinine: 0.74 mg/dL (ref 0.51–0.95)
GFR,Black: 95 *
GFR,Caucasian: 82 *
Glucose: 88 mg/dL (ref 60–99)
Lab: 24 mg/dL — ABNORMAL HIGH (ref 6–20)
Potassium: 4.7 mmol/L (ref 3.3–5.1)
Sodium: 136 mmol/L (ref 133–145)
Total Protein: 6.1 g/dL — ABNORMAL LOW (ref 6.3–7.7)

## 2016-10-09 LAB — PHOSPHORUS: Phosphorus: 4.2 mg/dL (ref 2.7–4.5)

## 2016-10-09 LAB — MAGNESIUM: Magnesium: 1.7 mEq/L (ref 1.3–2.1)

## 2016-10-09 MED ORDER — METHADONE HCL 5 MG PO TABS *I*
15.0000 mg | ORAL_TABLET | Freq: Every evening | ORAL | Status: DC
Start: 2016-10-09 — End: 2016-10-09

## 2016-10-09 MED ORDER — METHADONE HCL 5 MG PO TABS *I*
10.0000 mg | ORAL_TABLET | Freq: Two times a day (BID) | ORAL | Status: DC
Start: 2016-10-10 — End: 2016-10-09

## 2016-10-09 MED ORDER — METHADONE HCL 5 MG PO TABS *I*
10.0000 mg | ORAL_TABLET | Freq: Two times a day (BID) | ORAL | Status: DC
Start: 2016-10-10 — End: 2016-10-14
  Administered 2016-10-10 – 2016-10-14 (×10): 10 mg via ORAL
  Filled 2016-10-09 (×10): qty 2

## 2016-10-09 MED ORDER — FILGRASTIM-SNDZ (ZARXIO) 480 MCG/0.8ML IJ SOSY *I*
480.0000 ug | PREFILLED_SYRINGE | INTRAMUSCULAR | Status: DC
Start: 2016-10-10 — End: 2016-10-14
  Administered 2016-10-10 – 2016-10-14 (×2): 480 ug via SUBCUTANEOUS
  Filled 2016-10-09 (×6): qty 0.8

## 2016-10-09 MED ORDER — METHADONE HCL 5 MG PO TABS *I*
15.0000 mg | ORAL_TABLET | Freq: Every evening | ORAL | Status: DC
Start: 2016-10-09 — End: 2016-10-14
  Administered 2016-10-09 – 2016-10-13 (×5): 15 mg via ORAL
  Filled 2016-10-09 (×5): qty 3

## 2016-10-09 NOTE — Consults (Signed)
[facility]  [place], [place] [address]  Phone: [phone]  Fax: [phone]    Patient: Amanda Galloway   MR Number: [id number]   Date of Birth: 08-20-1945   Date of Visit: 09/28/2016      PURE-TONE TEST RESULTS  Type of Testing: conventional      Test Reliability: good  Transducer: insert  Booth: [facility] [employer]  ANSI S3.21.2004 [phone])     Air Conduction Testing (dB HL and kHz)     LEFT EAR RIGHT EAR     0.125 0.25 0.50  0.75 1.0 1.5 2.0 3.0 4.0 6.0 8.0  0.125 0.25 0.50 0.75 1.0 1.5 2.0 3.0 4.0 6.0 8.0     25 30   25   20 30 25 30  55      25 15   15   15 15 15 25  50                                                   Bone Conduction Testing (dB HL and kHz)  Bone conduction was masked when appropriate      0.25 0.50  0.75 1.0 1.5 2.0 3.0 4.0     0.25 0.50 0.75 1.0 1.5 2.0 3.0 4.0        40   30   25   30                                                                                                                                Effective Masking Level      55   55   55   55                            SPEECH AUDIOMETRY  CNT       Please refer to scanned [facility] [id number] MR for additional information     Notes  Threshold in dB HL  Frequency in kiloHertz (kHz) Legend   dB=decibels  HL=Hearing Level  NR=No Response  VT=Vibro-Tactile EML=Effective Masking Level  SAT=Speech Awareness Threshold  SRT=Speech Reception  Threshold  MLV=Monitored Live Voice  CD=Compact Disk       HISTORY:  Inpatient referred by Dr. Christy Sartorius to determine if she is a candidate for medical, surgical, or rehabilitative services.  Patient is currently undergoing treatment for pancreatic cancer.  Over the past few days she has noted intermittent, bilateral tinnitus.  She also complains of popping in both ears.  A change in hearing was denied.  There is no history of occupational or recreational noise exposure.    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  An asymmetry is noted.  There is no prior audiogram for comparison.  NOTE:  Testing performed using portable audiometer; therefore, speech and high-frequency testing could not be completed.        ACOUSTIC IMMITTANCE:  TYMPANOMETRY:    Right Ear: Tested Left Ear: Tested   Frequency: 226 Hz  Frequency: 226 Hz   Canal Volume (ml): 1.3 Canal Volume (ml): 1.3   Static Compliance Peak (ml): 1.3 Static Compliance Peak (ml): 1.0   Peak Pressure (daPa): -15 Peak Pressure (daPa): -10         These results are consistent with normal Eustachian tube function, bilaterally.    Today's test results were reviewed with the patient.    RECOMMENDATIONS: Audiologic monitoring while patient is receiving potentially ototoxic medication.  Re-evaluation is recommended if a change in hearing or tinnitus is noted.    Arlie Solomons. [name], Au.D.  [facility]

## 2016-10-09 NOTE — Progress Notes (Signed)
Hematology/Oncology Progress Note:      LOS: 10 days     HPI: [name] who was a transfer from OSH on 09/29/16 for workup for a pancreatic mass and pain control.    Interval Hx/Subjective:    - VSS, afebrile   - 23 demands and 20 received PCA dilaudid doses ON  - Slept very well last night but did ask night nurse to wake her up every few hours so that she didn't get behind on her pain pump  - PCA settings accidentally got cleared yesterday afternoon so demands from 3-11 are not reflected in chart  - Son is coming in from [place] tomorrow and she would like to be home for this but understands her pain is not controlled enough to come off the pump today  - Having bilateral tinnitus, hearing test ordered     Review of Systems   Constitutional: Positive for malaise/fatigue. Negative for chills, fever and weight loss.   Respiratory: Negative for cough and shortness of breath.    Cardiovascular: Negative for chest pain and leg swelling.   Gastrointestinal: Positive for abdominal pain and heartburn (after eating ). Negative for constipation, diarrhea, nausea and vomiting.   Musculoskeletal: Positive for back pain.     Objective:     Blood pressure 130/70, pulse 68, temperature 36.6 C (97.9 F), temperature source Temporal, resp. rate 22, height 1.68 m (5' 6.14"), weight 71.3 kg (157 lb 3 oz), SpO2 99 %.    Physical Exam   Constitutional: She is well-developed, well-nourished, and in no distress. No distress.   Lying in bed, NAD   HENT:   Head: Normocephalic.   Cardiovascular: Normal rate and regular rhythm.    Pulmonary/Chest: Effort normal and breath sounds normal. No respiratory distress. She has no wheezes.   Abdominal: Soft. Bowel sounds are normal. She exhibits no distension. There is tenderness.   Musculoskeletal: She exhibits no edema.   Neurological: She is alert.   Skin: She is not diaphoretic.   Psychiatric: Affect normal.   In good spirits   Nursing note and vitals  reviewed.      Assessment/Plan:     Patient is a 71 y.o.  female who has a newly found pancreatic mass and was transferred to [facility] from an OSH on 09/29/2016 for further workup and pain control. HPB consutled and not a surgical candidate at this time. IR celiac block 8/1 and Dr. Ron Agee placed mediport and performed ex lap 8/2. cycle 1 FOLFIRINOX  Finished 8/5, palliative care to follow. Pain continues to be a barrier to discharge. Will also get GI to consult and barium swallow per SLP suggestion due to reflux symptoms. Will also have audiology see patient d/t new tinnitus.     New pancreatic head mass  - has not established care with an oncologist yet (likely will be Dr. Sharlyn Bologna)  - has not received treatment yet  - biopsy at [facility] is in patient hard chart: positive for malignancy-adenocarcinoma   - obtaing staging Ct abd/pelvis:  Ill defined tissue which abuts the SMA  - appreciate HPB following  - [id number]  - s/p mediport placement and ex lap 8/2 with Dr, Ron Agee- no metastatic disease found  - cycle 1 of FOLFIRINOX finished 8/5 at 2100  - Start Neupogen 8/8, will need for 7-14days  - Patient complaining of tinnitus, hearing test ordered    Pain  - tylenol prn  - appreciate PC following  - PCA 0.5mg   q78mins, no basal  - EKG qtc 386 on 8/7  - methadone 10mg  TID(started 8/5 2100), per PC will increase nightly dose to 15mg  tonight  - s/p IR celiac block 8/1  - amtriptyline qhs     Hypoxia  - CT angio- negative for PE, possible signs of aspiration  - incentive spirometry    HTN/HLD  - cont home ASA  - cont maxzide (pt takes this for edema - has been on this long term)    Hypothyroidism  - cont synthroid    Constipation/PUD ppx/Nausea  - Colace BID  - senna BID  - Miralax BID  - Zofran/compazine PRN  - protonix BID  - Reglan TID  - swallow eval: some regurg recommend reglan TID, protonix BID, thin liquids w/ reg diet. GI consult made and barium swallow ordered at their recommendation.     DVT/PPX:   - Lovenox SQ      FEN:  - PO  - Labs daily, replete PRN  - regular diet per attending     PT consult RTPL w/ home PT    Full code    DISPO: Pending above  - Follow up with: needs to be set up with Dr. Sharlyn Bologna  - Tentative Discharge Date: unknown  - Rx/Equipment Needed: TBD    Author: Domingo Pulse, NP  as of: 10/09/2016  at: 6:44 AM

## 2016-10-09 NOTE — Progress Notes (Signed)
Assumed care of patient 0700-1900. VSS throughout shift. Pt states abdominal pain 4/10 on dilaudid PCA. EKG obtained. Audiogram done d/t new tinnitus. Ambulating around unit with family without issue. Please refer to patient flowsheets and eMAR  for further assessments and documentation.  Kerry Fort, RN

## 2016-10-09 NOTE — Progress Notes (Signed)
Physical Therapy Treatment Note    **Physical Therapist to review for discharge planning**  Amanda Galloway demonstrates adequate mobility to return to her prior living arrangement. No further acute PT needed. Discharge Recommendations: (P) Anticipate return to prior living arrangement, Home PT after discharge from the hospital.     PT Discharge Equipment Recommended: (P) None   10/09/16 0950   PT Tracking   PT TRACKING PT Discontinue   Visit Number   Visit Number [facility]) / Treatment Day (HH) 0  (Pt's daughter present, supportive and assisting. )   Precautions/Observations   Precautions used Yes   LDA Observation IV lines;PCA   Fall Precautions General falls precautions   Other Pt utilized PCA at start of session.    Pain Assessment   *Is the patient currently in pain? Yes   Pain (Before,During, After) Therapy During   0-10 Scale (Pt did not quantify)   Pain Location Abdomen   Pain Intervention(s) Refer to nursing for pain management;Repositioned   Cognition   Arousal/Alertness Appropriate responses to stimuli   Following Commands Follows simple commands without difficulty;Follows simple commands consistently   Additional Comments Pt polite and cooperative t/o.  Apologetic for prior session. Increased time for session for education w/ family and assessing pt's willingness for Home PT.    Bed Mobility   Bed mobility Tested   Supine to Sit Independent;Head of bed flat;Side rails down   Sit to Supine Independent;Head of bed flat;Side rails down   Supine to Long Sit Independent   Additional comments Able to complete w/o assist and tolerated flat lying minimally.    Transfers   Transfers Tested   Sit to Stand Independent   Stand to sit Independent   Transfer Assistive Device none   Additional comments Up from the EOB x 2 reps w/o any overt LOB.    Mobility   Mobility Tested   Gait Pattern WNL   Ambulation Assist Independent   Ambulation Distance (Feet) ~400'x1   Ambulation Assistive Device None   Stairs Assistance 1 person  assist;Contact guard   Stair Management Technique No rail;Forwards;Step to pattern   Number of Stairs 2x2   Additional comments Pt ambulated w/o a device w/ multiple head turns to address daughter w/o any overt LOB. She declined rest before/after stairs. She then navigated 2 step w/ writer guarding to educate daughter. CGA required to maintain balance in single leg stance.  Pt repeated 2 steps w/ her daughter guarding, demonstrating safe assist for home d/c.    Therapeutic Exercises   Additional comments Encouraged therex TID   Balance   Balance Tested   Sitting - Static Independent ;Unsupported   Standing - Static Independent;Unsupported   Standing - Dynamic Independent;Unsupported   Additional Comments No overt LOB. Mildly unsteady in prolonged single leg stance.    PT AM-PAC Mobility   Turning over in bed? 3   Sitting down on and standing up from a chair with arms? 3   Moving from lying on back to sitting on the side of the bed? 3   Moving to and from a bed to a chair? 4   Need to walk in hospital room? 4   Climbing 3 - 5 steps with a railing? 3   Total Raw Score 20   Standardized Score 47.67   CMS 1-100% Score 36   Assessment   Patient / Family Goal To go home ASAP.    Additional Comments At this time pt has demonstrated safe mobility for d/c  home. Pt's daughter is able to provide CGA for entry into/exit out of the home.  Pt will benefit from Home PT to address higher level balance deficits and to progress pt to the 2nd story of the townhouse w/o a railing.    Plan/Recommendation   Treatment Interventions No further PT interventions   PT Frequency none further   Hospital Stay Recommendations Out of bed with nursing assist;Ambulate daily with nursing assist;May be out of bed with family assist;May ambulate with family assist;Home care referral  (Adlib w/o a device)   Discharge Recommendations Anticipate return to prior living arrangement;Home PT   PT Discharge Equipment Recommended None    Assessment/Recommendations Reviewed With: Patient;Family;Nursing   Next Visit Home PT   Time Calculation   PT Timed Codes 23   PT Untimed Codes 0   PT Unbilled Time 2   PT Total Treatment 23   Plan and Onset date   Plan of Care Date 09/30/16   Onset Date 09/29/16   Treatment Start Date 09/30/16   PT Charges   $PT [facility] Charges Functional Training - code 0239 (92min);Gait Training - code 0208 (5min)   Angelena Sole, PTA  Pager [phone]

## 2016-10-09 NOTE — Plan of Care (Signed)
Cognitive function    • Cognitive function will be maintained or return to baseline Maintaining        Mobility    • Functional status is maintained or improved - Geriatric Maintaining        Safety    • Patient will remain free of falls Maintaining          Nutrition    • Nutritional status is maintained or improved - Geriatric Progressing towards goal        Pain/Comfort    • Patient's pain or discomfort is manageable Progressing towards goal        Psychosocial    • Demonstrates ability to cope with illness Progressing towards goal

## 2016-10-09 NOTE — Consults (Addendum)
Gastroenterology Initial Consult    Admit Date:  09/29/2016  Attending Provider:  Dorthula Nettles, MD                                  Primary Care Physician:  Celesta Aver, MD   Admitting Diagnosis: pancreatic mass     Consult reason: dysphagia and reflux symptoms    Subjective: Chart reviewed. History obtained from patient and chart review.    Amanda Galloway is 71 y.o. female with pmh including hypothyroidism, fibromyalgia, lap cholecystectomy (09/2016), and recently diagnosed pancreatic CA started on chemo with complaints of several days dysphagia. SLP eval completed yesterday and felt symptoms more consistent with reflux and GI evaluation was suggested.  Oncology team requested GI consult today for symptoms of reflux and dysphagia.      Pt seen with daughter at bedside.  She described 2 separate but similar problems occurring that originally started after last EGD done at outside hospital 12 days ago but has become more noticeable the last several days.  Pt says she feels at times like she has to actively try to swallow by collecting a lot of saliva in her mouth and clearing her throat.  Then after swallowing, she has been coughing more than usual.  The other symptom she has noticed was feeling food/liquid come up her esophagus and then tasting vomit but she does not actually vomit.  Pt admits to being on omeprazole in the past but not prior to recent issues with abdominal pain so started ~1-2 months ago.  Remainder of GI ROS is positive for abdominal pain that has been improving with palliative care assistance and is attributed to pancreatic mass but otherwise negative for heartburn, dyspepsia, nausea, vomiting, hematochezia, melena, diarrhea, and constipation.    Reviewed chart and paper records available from outside hospital.  Pt's had extensive work-up of abdominal pain recently prior to discovery of pancreatic mass.  She had multiple imaging studies, including CT's and MRI, ultrasounds, and endoscopic  evaluations.  Unable to locate actual reports from previous EGD's but GI consultation notes indicate findings on previous procedures were only notable for small hiatal hernia.  Pt had an upper GI series with barium xray on 07/11/16 with report notable for no strictures, mucosal abnormality or filling defects in the esophagus and stomach and presence of small hiatal hernia.  Report notes one episode of reflux with position change that was of physiologic significance to be correlated with clinical symptoms (which pt denies having reflux symptoms at that time).    Review of Systems  Constitutional: No unexplained weight loss, fevers, fatigue, generalized weakness, or loss of appetite.  Cardiovascular: No chest pain, palpitations, DOE, lower extremity edema, or syncope  Pulmonary: No sputum production, SOB, wheezing, or cough.   Gastrointestinal: +dysphagia, +regurgitation sensation, +abdominal pain; No jaundice, pruritus, scleral icterus, bloating, or cramping, odynophagia, heartburn, dyspepsia, nausea, vomiting, hematochezia, melena, diarrhea, and constipation.  Genitourinary: No dysuria, hematuria or unusual frequency of urination.  Integumentary: No rashes or jaundice.  Neurologic: No headaches, dizziness, lightheadedness, or paresthesias.  All other systems negative.    Medications:  Home Medications:  Prior to Admission medications    Medication Sig Start Date End Date Taking? Authorizing Provider   ondansetron (ZOFRAN-ODT) 4 MG disintegrating tablet Take 4 mg by mouth 3 times daily as needed for Nausea   Place on top of tongue.   Yes [provider]  Scheduled Meds   [START ON 10/10/2016] filgrastim-sndz  480 mcg Subcutaneous Q24H    [START ON 10/10/2016] methadone  10 mg Oral BID    methadone  15 mg Oral Nightly    metoclopramide  5 mg Oral TID AC    pantoprazole  40 mg Oral BID AC    amitriptyline  25 mg Oral Nightly    enoxaparin  40 mg Subcutaneous Daily    PROSOURCE NO CARB  30 mL Oral BID WC     senna  2 tablet Oral 2 times per day    polyethylene glycol  17 g Oral 2 times per day    dexamethasone  4 mg Oral BID    aspirin  81 mg Oral Daily    Vitamins - senior  1 tablet Oral Daily    docusate sodium  200 mg Oral 2 times per day    triamterene-hydrochlorothiazide  1 tablet Oral Daily    potassium chloride SA  40 mEq Oral Daily    levothyroxine  137 mcg Oral Daily     Continuous Infusions   sodium chloride 75 mL/hr (10/09/16 0356)    HYDROmorphone       PRN Meds  prochlorperazine, ondansetron, acetaminophen    Allergies/Sensitivities:   Allergies as of 09/28/2016    (Not on File)     Past Medical Hx: Reviewed and confirmed, details included below. Past Medical History:   Diagnosis Date    Cancer     Fibromyalgia     Hypothyroidism      Past Surgical Hx: Reviewed and confirmed, details include Past Surgical History:   Procedure Laterality Date    CHOLECYSTECTOMY, LAPAROSCOPIC  09/06/2016    HYSTERECTOMY  08/1986    Fibroids    KNEE SURGERY Right     PR INSERT TUNNELED CV CATH WITH PORT Right 10/04/2016    Procedure: Right IJ MEDIPORT Insertion;  Surgeon: Delano Metz, MD;  Location: [facility] MAIN OR;  Service: Oncology General    PR LAP,DIAGNOSTIC ABDOMEN N/A 10/04/2016    Procedure: LAPAROSCOPY DIAGNOSTIC;  Surgeon: Delano Metz, MD;  Location: [facility] MAIN OR;  Service: Oncology General    ROTATOR CUFF REPAIR Right     TONSILLECTOMY AND ADENOIDECTOMY       Social Hx: Reviewed and confirmed, details include  reports that she has never smoked. She has never used smokeless tobacco. Patient reports that she does not drink alcohol. Patient reports that she does not use illicit drugs.    Family Hx: Reviewed and confirmed,  family history includes Breast cancer in her mother; Cancer in her father; Diabetes in her sister; Heart Disease in her father; Obesity in her sister.    PHYSICAL EXAM:  Vitals:    10/09/16 0347 10/09/16 0630 10/09/16 0800 10/09/16 1207   BP: 130/70  100/58 106/60   BP Location: Left  arm  Left arm Left arm   Patient Position:       Pulse: 68  82 86   Resp: '20 22 20 20   ' Temp: 36.6 C (97.9 F)  36.2 C (97.2 F) 36.5 C (97.7 F)   TempSrc: Temporal  Temporal Temporal   SpO2: 99%  95% 96%   Weight:       Height:         Wt Readings from Last 3 Encounters:   10/05/16 71.3 kg (157 lb 3 oz)   09/30/16 67.1 kg (148 lb)     General appearance: alert, appears stated age and cooperative  Eye: EOM, anicteric   Ears, Nose, Mouth and throat: MMM, clear oralpharynx  Neck: symmetrical, trachea midline   Cardiovascular: RRR  Respiratory: clear to auscultation bilaterally  Gastrointestinal: complete exam has been performed, details include abdomen soft, non-tender currently, non-distended, normoactive bowel sounds  Skin: no lesions or jaundice  Extremities: extremities warm and dry, no edema    LAB DATA:    Recent Labs  Lab 10/09/16  0402 10/08/16  0406 10/07/16  0607 10/06/16  0415 10/05/16  0335   WBC 7.7 11.4* 11.0* 9.4 9.3   Hemoglobin 10.6* 10.2* 10.5* 10.6* 10.5*   Hematocrit 32* 31* 32* 33* 33*   MCV 97* 96* 96* 98* 98*   Platelets 443* 418* 430* 431* 426*     Recent Labs  Lab 10/09/16  0402 10/08/16  0406 10/07/16  0607 10/06/16  0415 10/05/16  0335   Sodium 136 136 136 137 138   Potassium 4.7 4.8 4.6 5.0 4.5   Chloride 97 99 97 95* 98   CO2 '26 27 28 ' 30* 29*   UN 24* 24* 25* 20 14   Creatinine 0.74 0.66 0.68 0.68 0.73     Recent Labs  Lab 10/09/16  0402 10/08/16  0406 10/07/16  0607 10/06/16  0415 10/05/16  0335   Alk Phos 67 65 70 78 76   Bilirubin,Total 0.3 0.2 <0.2 0.2 <0.2   Albumin 3.9 3.5 3.6 3.6 3.6   ALT 29 32 31 37* 47*   AST '16 21 19 20 ' 35   No results for input(s): AMY, LIP in the last 168 hours.  Recent Labs  Lab 10/04/16  0114   INR 1.0   No results for input(s): CRP, ESR in the last 168 hours.    IMAGING: Personally reviewed and note upper GI series with barium 07/2016 notable for no strictures, mucosal abnormality or filling defects in the esophagus and stomach and presence of small  hiatal hernia.  Report notes one episode of reflux with position change that was of physiologic significance    CT abdomen/pelvis 7/29  IMPRESSION:    Ill-defined prominent retroperitoneal soft tissue with central low attenuation suggestive of necrosis which abuts the SMA and is adjacent to the pancreatic uncinate process similar to prior MRI. Differential includes pancreatic neoplasm versus lymphadenopathy.     CTA chest 7/28:  1. The exam is negative for pulmonary embolism. No evidence for right ventricular strain.    2. Dependent groundglass opacities in the left upper and lower lobes likely represent sequela of aspiration. The upper esophagus is mildly dilated, this theoretically increases the risk of aspiration.    3. Small left and trace right pleural fluid with associated atelectasis.    4. Nonspecific 3 mm nodules in the right upper and middle lobe, recommend attention on follow-up imaging. There are also some small left neck base nodes as can be followed on subsequent imaging. No suspicious mediastinal adenopathy.    5. Other findings include: Right atrial enlargement, nonspecific bilateral lower lobar bronchial dilatation.    ASSESSMENT/RECOMMENDATIONS:    71 y.o. female with pmh including hypothyroidism, fibromyalgia, lap cholecystectomy (09/2016), and recently diagnosed pancreatic CA started on chemo with complaints of several days dysphagia. SLP eval completed yesterday and felt symptoms more consistent with reflux and GI evaluation was suggested.  Oncology team requested GI consult today for symptoms of reflux and dysphagia.      Based on clinical description and timing of symptom onset, possible post-EGD irritation and soreness but would  expect symptoms to be improving.  Most likely uncontrolled reflux due to prior UGI imaging show an episode of reflux and pt would benefit from medication adjustments.  As dysphagia and regurgitation/reflus symptoms are new compared to when prior UGI imaging was  completed, repeat testing is reasonable.  Additionally, differential includes reflux laryngitis.     Recommendations:  --esophagram ordered  --increase PPI to BID as primary team changed today  --agree with trial of reglan that was also started today  --if no improvement with above, consider stopping reglan and start trail of H2 blocker such as famotidine  --reflux lifestyle changes including avoiding smoking and other common triggers that may irritate symptoms (tomato sauce, chocolate, mint, garlic, onion, caffeine and alcohol), elevate head of bed and don't lie down within 2-3 hours of eating     Case discussed with consult attending.  Primary team updated.  Will continue to follow along.     Cyndy Freeze, PA on 10/09/2016 @ 4:28 PM  Department of Gastroenterology and Hepatology

## 2016-10-09 NOTE — Progress Notes (Signed)
Palliative Care Progress Note  Brief HPI:    Patient is a 71 y.o.female w h/o fibromyalgia, hypothyroidism who was a transfer from OSH on 09/29/16 for workup for a pancreatic massand pain control. The pain began at the beginning of April. Work up included endoscopy revealing a small hiatal hernia and mild gastric inflammation. Carafate was started without effect. She went to the ED and an abdominal ultrasound was performed with gallbladder inflammation noted. She had a cholecystectomy about 1 month ago by a Education officer, environmental. There was a brief resolution inher symptoms but only for a few days. When the pain returned it was treated as post operative pain/muscoskeletal pain. A work up was performed for but failedto demonstrate choledocholithiasis.     When her pain became severe, she went to an ED in [place], where CT a/p was negative 2 weeks ago and she was sent home. She returned 1 week later and was admitted for a work up and pain management. AnMRCP revealed a mass in head of the pancreas and a LN in the celiac plexus. 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    She was transferred to [facility] for further workup/treatment and palliative care consulted for pan management. She underwent a  celiac plexus block and medivport placement and exploratory lap which did not reveal metas to omentum. Her PCA was adjusted for pain control and transitioned to methadone on 10/07/16. PCA left for prn breakthrough.    Significant 24h Events: Pain level typically down to 4 in evening but up to 8 in am. She states she slept well last night with second day amitriptyline 25 mg qhs.     Subjective:    Pt sitting at bedsides, smiling. Cleared by PT for home plan. Dilaudid use over the last 24 hrs 15 mg IV, on methadone 10 mg tid. EKG performed with QT 0.386.    Palliative Care ROS:  Denies, CP, SOB, Constipation, diarrhea, sweats. Endorses pain in upper abdomen with band like quality intermittently.     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    Malignant neoplasm of pancreas C25.9       No Known Allergies (drug, envir, food or latex)    Scheduled Meds:    metoclopramide  5 mg Oral TID AC    pantoprazole  40 mg Oral BID AC    methadone  10 mg Oral 3 times per day    amitriptyline  25 mg Oral Nightly    enoxaparin  40 mg Subcutaneous Daily    PROSOURCE NO CARB  30 mL Oral BID WC    senna  2 tablet Oral 2 times per day    polyethylene glycol  17 g Oral 2 times per day    dexamethasone  4 mg Oral BID    aspirin  81 mg Oral Daily    Vitamins - senior  1 tablet Oral Daily    docusate sodium  200 mg Oral 2 times per day    triamterene-hydrochlorothiazide  1 tablet Oral Daily    potassium chloride SA  40 mEq Oral Daily    levothyroxine  137 mcg Oral Daily       Continuous Infusions:    sodium chloride 75 mL/hr (10/09/16 0356)    HYDROmorphone         PRN Meds:  prochlorperazine, ondansetron, acetaminophen    Past 24h PRN Usage:  IV dilaudid 15 mg w pca demand    Physical  Examination:   BP: (100-130)/(54-70)   Temp:  [36.2 C (97.2 F)-36.8 C (98.2 F)]   Temp src: Temporal (08/07 0800)  Heart Rate:  [68-87]   Resp:  [16-22]   SpO2:  [91 %-99 %]   General appearance: Interactive, slightly euphoric.   Lungs: clear to auscultation bilaterally decreased left base.  Heart: S1, S2 normal  Abdomen: soft, + BS all quadrants  Skin: warm, dry, minimal edema  Neurologic: A+O x 3    Assessment/Plan: Amanda Galloway is a 71 yo female w hypothyroidism, fibromyalgia, s/p chole with newly dx pancreatic caner being seen for pain management.       Upper abdominal pain  - methadone 10 mg tid started eve of 8/5 . In addition on dilaudid PCA 0.5 mg prn q 20 minutes. Calculation of IV dilaudid used over previous 24 hrs was 15 mg.Conversion to oral dilaudid would be 12.5 mg q 4 hrs prn and morphine 35 mg q 4 hrs prn. She states her pain now 7/10.  Due to the dose and current pain level will hold off on  prn oral regime. Peak effect of methadone this evening. Could consider increase methadone tonight to 15 mg and continue am and afternoon dose at 10 mg. If pain stable in am will be able to calculate oral prn regime.   - suspect after 6 days, will not have relief from celiac block.    -encourage tylenol 650 mg q 6 hrs  - continue decadron 4 mg bid as likely beneficial to reduce inflammation and visceral pain at this time.   - Lyrica stopped w/o affect.   -recommend f/u with Palliative care as an outpatient.    Constipation/Elimination  - continue colace , senna with hold parameters for loose stool. Miralax 17 gm bid w hold for loose BM.  - continue increased ambulation.       Nausea  -has been controlled but agree with prn zofran with reglan as second line therapy.  -Swallow study negative for oropharyngeal dysphagia, reglan tid started 24 hrs ago.  -encouraged frequent small meals which has helped along with the pain control.    Mood/pain  Amitriptyline 25 mg qhs.    Will continue to follow for emotional support and pain control. Daughter and patient coping well at present time. Amanda Galloway plans to stay in [place]  for chemotherapy. Son returned to [place] with his wife.     Amanda Galloway ANP  Palliative Care Service

## 2016-10-09 NOTE — Progress Notes (Signed)
Assumed care of patient 1900-0700. All VSS throughout shift. Patient continues to remain on a dilaudid PCA, rating her abdominal pain between a 4/10- 8/10. Patient has been appropriately using the PCA and states her pain is manageable at this time. No other complaints from patient at this time. Bed alarm is activated for patient safety. Please refer to patient flowsheets and eMAR for further assessments and documentation.     Nyoka Lint, RN

## 2016-10-10 ENCOUNTER — Inpatient Hospital Stay: Payer: PRIVATE HEALTH INSURANCE

## 2016-10-10 DIAGNOSIS — K224 Dyskinesia of esophagus: Secondary | ICD-10-CM

## 2016-10-10 DIAGNOSIS — C259 Malignant neoplasm of pancreas, unspecified: Secondary | ICD-10-CM

## 2016-10-10 DIAGNOSIS — Z09 Encounter for follow-up examination after completed treatment for conditions other than malignant neoplasm: Secondary | ICD-10-CM

## 2016-10-10 DIAGNOSIS — R197 Diarrhea, unspecified: Secondary | ICD-10-CM

## 2016-10-10 LAB — CBC AND DIFFERENTIAL
Baso # K/uL: 0 10*3/uL (ref 0.0–0.1)
Basophil %: 0.5 %
Eos # K/uL: 0.4 10*3/uL (ref 0.0–0.4)
Eosinophil %: 4 %
Hematocrit: 30 % — ABNORMAL LOW (ref 34–45)
Hemoglobin: 9.8 g/dL — ABNORMAL LOW (ref 11.2–15.7)
IMM Granulocytes #: 0 10*3/uL (ref 0.0–0.1)
IMM Granulocytes: 0.3 %
Lymph # K/uL: 2.8 10*3/uL (ref 1.2–3.7)
Lymphocyte %: 31.7 %
MCH: 32 pg/cell (ref 26–32)
MCHC: 33 g/dL (ref 32–36)
MCV: 97 fL — ABNORMAL HIGH (ref 79–95)
Mono # K/uL: 0.2 10*3/uL (ref 0.2–0.9)
Monocyte %: 1.9 %
Neut # K/uL: 5.4 10*3/uL (ref 1.6–6.1)
Nucl RBC # K/uL: 0 10*3/uL (ref 0.0–0.0)
Nucl RBC %: 0 /100 WBC (ref 0.0–0.2)
Platelets: 407 10*3/uL — ABNORMAL HIGH (ref 160–370)
RBC: 3.1 MIL/uL — ABNORMAL LOW (ref 3.9–5.2)
RDW: 13.2 % (ref 11.7–14.4)
Seg Neut %: 61.6 %
WBC: 8.8 10*3/uL (ref 4.0–10.0)

## 2016-10-10 LAB — COMPREHENSIVE METABOLIC PANEL
ALT: 28 U/L (ref 0–35)
AST: 15 U/L (ref 0–35)
Albumin: 3.7 g/dL (ref 3.5–5.2)
Alk Phos: 67 U/L (ref 35–105)
Anion Gap: 10 (ref 7–16)
Bilirubin,Total: 0.2 mg/dL (ref 0.0–1.2)
CO2: 28 mmol/L (ref 20–28)
Calcium: 8.9 mg/dL (ref 8.6–10.2)
Chloride: 97 mmol/L (ref 96–108)
Creatinine: 0.78 mg/dL (ref 0.51–0.95)
GFR,Black: 89 *
GFR,Caucasian: 77 *
Glucose: 98 mg/dL (ref 60–99)
Lab: 21 mg/dL — ABNORMAL HIGH (ref 6–20)
Potassium: 4.3 mmol/L (ref 3.3–5.1)
Sodium: 135 mmol/L (ref 133–145)
Total Protein: 6 g/dL — ABNORMAL LOW (ref 6.3–7.7)

## 2016-10-10 LAB — PHOSPHORUS: Phosphorus: 3.6 mg/dL (ref 2.7–4.5)

## 2016-10-10 LAB — MAGNESIUM: Magnesium: 1.6 mEq/L (ref 1.3–2.1)

## 2016-10-10 MED ORDER — HYDROMORPHONE HCL 2 MG/ML IJ SOLN *WRAPPED*
0.5000 mg | Freq: Once | INTRAMUSCULAR | Status: AC
Start: 2016-10-10 — End: 2016-10-10
  Administered 2016-10-10: 0.5 mg via INTRAVENOUS

## 2016-10-10 NOTE — Progress Notes (Signed)
Hematology/Oncology Progress Note:      LOS: 11 days     HPI: Amanda Galloway is a 71 y.o. female who was a transfer from OSH on 09/29/16 for workup of a pancreatic mass, as well as pain control.    Interval Hx/Subjective:    - VSS, afebrile   - Experienced severe pain overnight, attributes this to sleeping for 3+ hrs without using PCA  - Reports PCA dose relieves pain for about 1 hr  - Multiple BMs yesterday    Review of Systems   Constitutional: Positive for malaise/fatigue. Negative for chills, fever and weight loss.   Respiratory: Negative for cough and shortness of breath.    Cardiovascular: Negative for chest pain and leg swelling.   Gastrointestinal: Positive for abdominal pain and heartburn (after eating ). Negative for constipation, diarrhea, nausea and vomiting.     Objective:     Blood pressure 120/66, pulse 66, temperature 36.2 C (97.2 F), temperature source Temporal, resp. rate 18, height 1.68 m (5' 6.14"), weight 71.3 kg (157 lb 3 oz), SpO2 100 %.    Physical Exam   Constitutional: She is well-developed, well-nourished, and in no distress. No distress.   Lying in bed, NAD, able to get to sitting position without assistance   HENT:   Head: Normocephalic.   Cardiovascular: Normal rate and regular rhythm.    Pulmonary/Chest: Effort normal and breath sounds normal. No respiratory distress. She has no wheezes.   Abdominal: Soft. Bowel sounds are normal. She exhibits no distension. There is tenderness.   Musculoskeletal: She exhibits no edema.   Neurological: She is alert.   Skin: She is not diaphoretic.   Psychiatric: Mood and affect normal.   In good spirits   Nursing note and vitals reviewed.      Assessment/Plan:     Amanda Galloway is a 71 y.o. female who has a newly found pancreatic mass and was transferred to [facility] from an OSH on 09/29/2016 for further workup and pain control. HPB consulted and is not a surgical candidate at this time. IR celiac block 8/1, and Dr. Ron Agee placed mediport and performed ex  lap 8/2. cycle 1 FOLFIRINOX  Finished 8/5.  - Pain continues to be a barrier to discharge, Palliative care continues to follow closely with adjustments in pain regimen    ONC-New pancreatic head mass:  - has not established care with an oncologist yet (likely will be Dr. Sharlyn Bologna)  - has not received treatment yet  - biopsy at [facility] is in patient hard chart: positive for malignancy-adenocarcinoma   - obtaing staging Ct abd/pelvis:  Ill defined tissue which abuts the SMA  - Appreciate HPB following  - [id number]  - s/p mediport placement and ex lap 8/2 with Dr, Ron Agee- no metastatic disease found  - cycle 1 of FOLFIRINOX finished 8/5 at 2100  - Started Neupogen 8/8, will need for 7-14 days  - Patient complaining of tinnitus, hearing test ordered: normal bilateral eustachian tubes    Pain:  - tylenol prn  - Appreciate PC following:  - PCA 0.5mg  q59mins, no basal  - EKG qtc 386 on 8/7  - methadone 10mg  TID(started 8/5 2100), nightly dose 15mg   - s/p IR celiac block 8/1  - amtriptyline qhs     Hypoxia:  - CT angio- negative for PE, possible signs of aspiration  - incentive spirometry    HTN/HLD:  - cont home ASA  - cont maxzide (pt takes this for edema - has been  on this long term)    Hypothyroidism:  - cont synthroid    Constipation/PUD ppx/Nausea:  - Colace BID  - senna BID  - Miralax BID  - Zofran/compazine PRN  - protonix BID  - Reglan TID  - swallow eval: some regurg recommend reglan TID, protonix BID, thin liquids w/ reg diet.   - Appreciate GI consult:  - Barium swallow ordered: pending    DVT/PPX:   - Lovenox SQ     FEN:  - PO  - Labs daily, replete PRN  - Regular diet per attending     PT eval: RTPL w/ home PT    Full code    DISPO: Pending above    - Follow up with med onc: needs to be set up with Dr. Sharlyn Bologna  - Tentative Discharge Date: unknown  - Rx/Equipment Needed: TBD    Author: Sharyn Dross, NP  as of: 10/10/2016  at: 7:05 AM

## 2016-10-10 NOTE — Consults (Signed)
Medical Nutrition Therapy - Follow Up    Admit Date: 09/29/2016    Patient Summary: 71 year old female who transferred from OSH for workup of pancreatic mass and pain control. She originally saw a GI specialist for constipation, is s/p cholecystectomy 1 month ago- but pain returned soon after. After continued workup an MCRP revealed a mass on head of pancreas, an ERCP was performed with biopsy pending- found to have locally advanced pancreatic cancer- biopsy diagnosis of adenocarcinoma- unresectable. S/p IR celiac block 8/1. S/p mediport placement 8/2. plan to start chemo today (8/3).     Interval Hx: seen by SLP 8/6- no evidence of prandial aspiration or oropharyngeal dysphagia- continue regular diet. GI saw pt 8/7- pt reports collecting a lot of saliva in mouth requiring active swallowing and throat clearing- coughing more than usual; and feeling food/liquid comes back up with a taste of vomit, but no vomiting- PPI was increased to BID, trial of reglan.     Pertinent Meds: reviewed bowel regimen, methadone, metoclopramide, multivitamin, ondansetron, pantoprazole, potassium chloride, prochlorperazine, 75 mL/hr sodium chloride, triamterene-hydrochlorothiazide.   Pertinent Labs: reviewed lytes WNL, BUN 21 slightly elevated (down from past 3 days)    Reviewed I/O's  PO- 400-820 mL  IV-1.73-2.14 L/d  Unmeasured UOP  BM 2x 8/7    Access: IVAD    Food allergies: NKFA    Current diet: Regular  Supplements: Ensure Enlive QID, 30 mL ProSource BID    Nutrition Focused Physical Exam:    Edema: none   Abdomen: soft, epigastric tenderness  Skin: abdominal lap site x 3 incisions c/d/i  Body fat stores: adequate  Lean body mass stores: mild losses     Anthropometrics:  Height: 170 cm (5'7")  Current Wt: 71.3 kg 8/3  Admit Weight: 67.1 kg (148 lb)- stated  Ideal Body Weight: 72.3 kg + 10%  BMI: 23.2- normal range for age, lower end  Weight Hx: down 15-20 lbs x 4 months; 9-12% loss- severe for timeframe     Estimated Nutrient  Needs:             (Based on 67 kg)                                          1675-2350 kcal/day (25-35 kcal/kg)                        80-100 g protein/day (1.2-1.5 g/kg)                         1675-2010 mL fluid/day (25-30 mL/kg)    Nutrition Assessment and Diagnosis:   Pt was off unit at esophagram, spoke with daughter who reports she is continuing to work on maintaining adequate intake. Will finishing at least half of meal-usually more, is accepting of Prosource, and eating snacks brought in by family. Not drinking ensure but family has been taking them home should they be needed after discharge. Pt told daughter she thinks there has been an improvement in reflux like symptoms with recent medication changes.     Malnutrition Status: Pt with evidence of moderate malnutrition , identified 7/30.     Nutrition Intervention:   1. Continue regular diet, Ensure Enlive QID, and 30 mL ProSource BID    Encourage small frequent meals and snacks. Educational handouts provided on initial assessment. Family  encouraging.  2. Ok to hold multivitamin if it appears to impact nausea.   3. Weekly weights ordered- continue     Nutrition Monitoring/Evaluation:   1. Will monitor diet tolerance and intake, nutrition-related labs, weight trend, BM pattern, supplement acceptance.   2. Will monitor PO tolerance, nutrition-related labs, weight trend, BM pattern.   3. Will follow up per high nutrition risk protocol.    Ammie Ferrier Tamms, [place] [phone]

## 2016-10-10 NOTE — Progress Notes (Signed)
SW provided family with voucher for ONE parking passes.  SW explained that this voucher needs to be redeemed at parking office on the ground floor of the garage.    This is a total of ELEVEN parking vouchers given so far this admission.   Wonda Cheng, [place]

## 2016-10-10 NOTE — Plan of Care (Signed)
Assumed care of patient 1900-0700 .  VSS throughout shift. Writer educated patient on appropriate use of Dilaudid PCA, pt verbalized understanding. Rates pain 3-8/10, appears in severe pain at times wincing and guarding abdomen even when using PCA button q20 min. During her episodes of severe pain, she verbalized that she did not want any additional pain medication. Rest, repositioning, and distraction utilized.  Will continue to monitor. Please refer to patient flowsheets and eMAR  for further assessments and documentation.  Tharon Aquas, RN      Cognitive function     Cognitive function will be maintained or return to baseline Maintaining        Mobility     Functional status is maintained or improved - Geriatric Maintaining        Nutrition     Nutritional status is maintained or improved - Geriatric Maintaining        Pain/Comfort     Patient's pain or discomfort is manageable Maintaining        Psychosocial     Demonstrates ability to cope with illness Maintaining        Safety     Patient will remain free of falls Maintaining

## 2016-10-10 NOTE — Patient Instructions (Addendum)
August 2018   Sunday Monday Tuesday Wednesday Thursday Friday Saturday                  1     CT CELIAC BLOCK THERAPEUTIC    9:40 AM   (30 min.)   [facility], [facility] IR CT5 (IRCT5)   [facility] 2     FL PORTABLE FLUOROSCOPY IN OR   10:40 AM   (30 min.)   [facility], [facility] FL C41 (C41)   [facility]     XR CHEST SINGLE VIEW   11:40 AM   (5 min.)   [facility], [facility] DR P25 (P25)   [facility] 3     4       5     6     7     NO CLINICAL DOCUMENTATION    3:30 PM   (60 min.)   Ashrafioun, Christina, AUD    Audiology 8     FL ESOPHAGRAM/BARIUM SWALLOW    9:30 AM   (45 min.)   [facility], [facility] FL13 (RM13)   [facility] 9     10     11       12     13     14     INJECTION    1:45 PM   (15 min.)   [facility] CTR, INJECTION POD   [facility]     FOLLOW UP VISIT    2:30 PM   (30 min.)   Tejani, Mohamed, MD   [facility] 15     16     17    Port Draw@8:00    FOLFIRINOX   18       19     20     21     22     23     24     25       26     27     28     29     30     31    Port Draw@10:00    PA/MD visit@10:30    FOLFIRINOX  @11:02 December 2016   Sunday Monday Tuesday Wednesday Thursday Friday Saturday                                 1       2     3     4     5     6    Palliative care 9:30   7     8       9     10     11     12     13     14    Port Draw@11:00    PA/MD visit@11:30    FOLFIRINOX  @12:00 15       16     17     18     19     20     21     22       23     24       25     26     27     28    Port Draw@9:30    PA/MD visit@10:00    FOLFIRINOX  @11:00   29       01 January 2017   Sunday Monday Tuesday Wednesday Thursday Friday Saturday        1     2     3     4     5     6       7     8     9     10     11     12     13       14     15     16     17     18     19     20       21     22     23     24     25      26     27       28     29     30     31                             Your Oncology team    [phone]    Mohamed Tejani MD  Guinevere Hellems PA  Cheryl Lamay RN/Sue Fowler RN  Akshitha Culmer Secretary

## 2016-10-10 NOTE — Progress Notes (Signed)
Report Given   Diagnostic Imaging Nurse Handoff:    Study: esoph/barium swallow  A&O: yes  Telemetry: no   Precautions: none  Transport: stretcher  NPO: no  Tube Feeds: no  Report Received From: Olin          Procedure Summary

## 2016-10-10 NOTE — Plan of Care (Signed)
Pain/Comfort     Patient's pain or discomfort is manageable Goal not met          Cognitive function     Cognitive function will be maintained or return to baseline Maintaining        Mobility     Functional status is maintained or improved - Geriatric Maintaining        Nutrition     Nutritional status is maintained or improved - Geriatric Maintaining        Psychosocial     Demonstrates ability to cope with illness Maintaining        Safety     Patient will remain free of falls Maintaining          Assumed care of patient from 0700 to 1900. VSS throughout shift. Pt off the floor this AM for esophogram. Pt continues to use PCA for 5/10 abdominal pain. Pt started filgrastim today.  Pt requesting parking passes for family, however social work is not in until tomorrow. Call bell is within reach. Bed in the lowest position. Making needs known to Probation officer. Will continue to monitor and assess. Please refer to patient flowsheets and eMAR for further assessments and documentation.    Harriet Butte, RN

## 2016-10-10 NOTE — Progress Notes (Signed)
Assumed care of patient 1900-0700. All VSS throughout shift. Patient continues to remain on a dilaudid PCA, using appropiately. At approximately 0300, patient woke up in severe pain and was not able to manage it with her PCA dose. Provider notified. Bolus dose was ordered and given. No other complaints from patient at this time. Bed alarm is activated for patient safety. Please refer to patient flowsheets and eMAR for further assessments and documentation.     Nyoka Lint, RN

## 2016-10-10 NOTE — Progress Notes (Signed)
Palliative Care Progress Note  Brief HPI:    Patient is a 71 y.o.female w h/o fibromyalgia, hypothyroidism who was a transfer from OSH on 09/29/16 for workup for a pancreatic massand pain control. The pain began at the beginning of April. Work up included endoscopy revealing a small hiatal hernia and mild gastric inflammation. Carafate was started without effect. She went to the ED and an abdominal ultrasound was performed with gallbladder inflammation noted. She had a cholecystectomy about 1 month ago by a Education officer, environmental. There was a brief resolution inher symptoms but only for a few days. When the pain returned it was treated as post operative pain/muscoskeletal pain. A work up was performed for but failedto demonstrate choledocholithiasis.     When her pain became severe, she went to an ED in [place], where CT a/p was negative 2 weeks ago and she was sent home. She returned 1 week later and was admitted for a work up and pain management. AnMRCP revealed a mass in head of the pancreas and a LN in the celiac plexus. 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    She was transferred to [facility] for further workup/treatment and palliative care consulted for pan management. She underwent a  celiac plexus block and medivport placement and exploratory lap which did not reveal metas to omentum. Her PCA was adjusted for pain control and transitioned to methadone on 10/07/16. PCA left for prn breakthrough.    Significant 24h Events: Pain level typically down to 4 - 6 did wake with 10/10 pain. She states she slept well last night with second day amitriptyline 25 mg qhs. Evening methadone increased from 10 to 15 mg.     Subjective:    Pt sitting at bedside, smiling. Mentally much clearer. Does state "that pain was horrible last night" and worried about reoccurrence. Cleared by PT for home plan. Continued with dilaudid pca for breakthrough only using 18.5 mg IV in 24 hrs 18.5 mg IV, on methadone 10 bid and 15  mg qhs. EKG performed with QT 0.386 on 8/7.    Palliative Care ROS:  Denies, CP, SOB, Constipation, diarrhea, sweats. Endorses pain in upper abdomen with sharp band like quality only 4/10 intermittently.     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    Malignant neoplasm of pancreas C25.9       No Known Allergies (drug, envir, food or latex)    Scheduled Meds:    filgrastim-sndz  480 mcg Subcutaneous Q24H    methadone  10 mg Oral BID    methadone  15 mg Oral Nightly    metoclopramide  5 mg Oral TID AC    pantoprazole  40 mg Oral BID AC    amitriptyline  25 mg Oral Nightly    enoxaparin  40 mg Subcutaneous Daily    PROSOURCE NO CARB  30 mL Oral BID WC    senna  2 tablet Oral 2 times per day    polyethylene glycol  17 g Oral 2 times per day    dexamethasone  4 mg Oral BID    aspirin  81 mg Oral Daily    Vitamins - senior  1 tablet Oral Daily    docusate sodium  200 mg Oral 2 times per day    triamterene-hydrochlorothiazide  1 tablet Oral Daily    potassium chloride SA  40 mEq Oral Daily    levothyroxine  137 mcg Oral Daily  Continuous Infusions:    sodium chloride 75 mL/hr (10/10/16 0321)    HYDROmorphone         PRN Meds:  prochlorperazine, ondansetron, acetaminophen    Past 24h PRN Usage:  IV dilaudid 18.5 mg w pca demand    Physical Examination:   BP: (104-120)/(58-66)   Temp:  [36.2 C (97.2 F)-37.1 C (98.8 F)]   Temp src: Temporal (08/08 0729)  Heart Rate:  [66-88]   Resp:  [16-20]   SpO2:  [96 %-100 %]   General appearance: Interactive, engaged and smiling today.   Lungs: clear to auscultation bilaterally sl decreased left base.  Heart: S1, S2 normal  Abdomen: soft, + BS all quadrants  Skin: warm, dry, minimal edema  Neurologic: A+O x 3    Assessment/Plan: Amanda Galloway is a 71 yo female w hypothyroidism, fibromyalgia, s/p chole with newly dx pancreatic caner being seen for pain management.       Upper abdominal pain  - methadone 10 mg tid started  eve of 8/5 w eve dose bump to 15 mg last night . In addition on dilaudid PCA 0.5 mg prn q 20 minutes. Calculation of IV dilaudid used over previous 24 hrs was 18.5 mg. Conversion to oral dilaudid would be 15 mg q 4 hrs prn and morphine 40 mg q 4 hrs prn.    - suspect after 6 days, will not have relief from celiac block. Pt asking about another attempt at celiac block which was addressed with Oncology team. Request oncology to f/u with patient.   -encourage tylenol 650 mg q 6 hrs  - continue decadron 4 mg bid as likely beneficial to reduce inflammation and visceral pain at this time.   - Lyrica stopped w/o affect.   -recommend f/u with Palliative care as an outpatient.    Constipation/Elimination  - continue colace , senna with hold parameters for loose stool. Miralax 17 gm bid w hold for loose BM. Per pt BM today.  - continue increased ambulation.       Nausea  -has been controlled but agree with prn zofran with reglan as second line therapy.  -Bedside swallow evaluation negative for oropharyngeal dysphagia, reglan tid started 24 hrs ago. Had Barium swallow today for "baseline"  -encouraged frequent small meals which has helped along with the pain control.    Mood/pain  Amitriptyline 25 mg qhs.    Will continue to follow for emotional support and pain control. Daughter and patient coping well at present time. Aminat plans to stay in [place]  for chemotherapy. Son returned to [place] with his wife.     [name] ANP  Palliative Care Service

## 2016-10-11 LAB — CBC AND DIFFERENTIAL
Baso # K/uL: 0 10*3/uL (ref 0.0–0.1)
Basophil %: 0 %
Eos # K/uL: 0 10*3/uL (ref 0.0–0.4)
Eosinophil %: 0 %
Hematocrit: 31 % — ABNORMAL LOW (ref 34–45)
Hemoglobin: 10.3 g/dL — ABNORMAL LOW (ref 11.2–15.7)
Lymph # K/uL: 4.6 10*3/uL — ABNORMAL HIGH (ref 1.2–3.7)
Lymphocyte %: 9.6 %
MCH: 32 pg/cell (ref 26–32)
MCHC: 33 g/dL (ref 32–36)
MCV: 98 fL — ABNORMAL HIGH (ref 79–95)
Mono # K/uL: 0 10*3/uL — ABNORMAL LOW (ref 0.2–0.9)
Monocyte %: 0 %
Neut # K/uL: 41.3 10*3/uL — ABNORMAL HIGH (ref 1.6–6.1)
Nucl RBC # K/uL: 0 10*3/uL (ref 0.0–0.0)
Nucl RBC %: 0 /100 WBC (ref 0.0–0.2)
Platelets: 465 10*3/uL — ABNORMAL HIGH (ref 160–370)
RBC: 3.2 MIL/uL — ABNORMAL LOW (ref 3.9–5.2)
RDW: 13.2 % (ref 11.7–14.4)
Seg Neut %: 90.4 %
WBC: 45.9 10*3/uL — ABNORMAL HIGH (ref 4.0–10.0)

## 2016-10-11 LAB — COMPREHENSIVE METABOLIC PANEL
ALT: 28 U/L (ref 0–35)
AST: 23 U/L (ref 0–35)
Albumin: 3.9 g/dL (ref 3.5–5.2)
Alk Phos: 78 U/L (ref 35–105)
Anion Gap: 14 (ref 7–16)
Bilirubin,Total: 0.3 mg/dL (ref 0.0–1.2)
CO2: 27 mmol/L (ref 20–28)
Calcium: 9 mg/dL (ref 8.6–10.2)
Chloride: 98 mmol/L (ref 96–108)
Creatinine: 0.73 mg/dL (ref 0.51–0.95)
GFR,Black: 96 *
GFR,Caucasian: 84 *
Glucose: 87 mg/dL (ref 60–99)
Lab: 19 mg/dL (ref 6–20)
Potassium: 4.5 mmol/L (ref 3.3–5.1)
Sodium: 139 mmol/L (ref 133–145)
Total Protein: 6.3 g/dL (ref 6.3–7.7)

## 2016-10-11 LAB — PHOSPHORUS: Phosphorus: 4.4 mg/dL (ref 2.7–4.5)

## 2016-10-11 LAB — DIFF MANUAL: Diff Based On: 115 CELLS

## 2016-10-11 LAB — MAGNESIUM: Magnesium: 1.5 mEq/L (ref 1.3–2.1)

## 2016-10-11 LAB — RBC MORPHOLOGY

## 2016-10-11 NOTE — Progress Notes (Addendum)
Palliative Care Progress Note  Brief HPI:    Patient is a 71 y.o.female w h/o fibromyalgia, hypothyroidism who was a transfer from OSH on 09/29/16 for workup for a pancreatic massand pain control. The pain began at the beginning of April. Work up included endoscopy revealing a small hiatal hernia and mild gastric inflammation. Carafate was started without effect. She went to the ED and an abdominal ultrasound was performed with gallbladder inflammation noted. She had a cholecystectomy about 1 month ago by a Education officer, environmental. There was a brief resolution inher symptoms but only for a few days. When the pain returned it was treated as post operative pain/muscoskeletal pain. A work up was performed for but failedto demonstrate choledocholithiasis.     When her pain became severe, she went to an ED in [place], where CT a/p was negative 2 weeks ago and she was sent home. She returned 1 week later and was admitted for a work up and pain management. AnMRCP revealed a mass in head of the pancreas and a LN in the celiac plexus. 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    She was transferred to [facility] for further workup/treatment and palliative care consulted for pan management. She underwent a  celiac plexus block and medivport placement and exploratory lap which did not reveal metas to omentum. Her PCA was adjusted for pain control and transitioned to methadone on 10/07/16. PCA left for prn breakthrough.    Significant 24h Events: Barium swallow yesterday noted airway penetration of contrast. Started sq Zarxio yesterday and WBC up to 45.9 today. Pain controlled to 3-4/10 but spike overnight to 7-8/10 level. Continued amitriptyline 25 mg qhs. Methadone 10 mg bid and evening methadone 15 mg.     Subjective:  Pt sitting in bed voicing frustration overnight when made to  "feel like she could not use her PCA if needed. " Only used PCA overnight once despite noted increased pain rating in chart. Notes that pain  better when went to be but poor control ths am. Continued with dilaudid pca for breakthrough only using 19 mg IV in 24 hrs , on methadone 10 bid and 15 mg qhs. EKG performed with QT 0.386 on 8/7.    Palliative Care ROS:  Denies, CP, SOB, Constipation, diarrhea, sweats. Endorses pain in upper abdomen with sharp band like quality 7/10 mostly constant now.      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    Malignant neoplasm of pancreas C25.9       No Known Allergies (drug, envir, food or latex)    Scheduled Meds:    filgrastim-sndz  480 mcg Subcutaneous Q24H    methadone  10 mg Oral BID    methadone  15 mg Oral Nightly    metoclopramide  5 mg Oral TID AC    pantoprazole  40 mg Oral BID AC    amitriptyline  25 mg Oral Nightly    enoxaparin  40 mg Subcutaneous Daily    PROSOURCE NO CARB  30 mL Oral BID WC    senna  2 tablet Oral 2 times per day    polyethylene glycol  17 g Oral 2 times per day    dexamethasone  4 mg Oral BID    aspirin  81 mg Oral Daily    Vitamins - senior  1 tablet Oral Daily    docusate sodium  200 mg Oral 2 times per day    triamterene-hydrochlorothiazide  1 tablet Oral Daily    potassium chloride SA  40 mEq Oral Daily    levothyroxine  137 mcg Oral Daily       Continuous Infusions:    sodium chloride 75 mL/hr (10/11/16 0306)    HYDROmorphone         PRN Meds:  prochlorperazine, ondansetron, acetaminophen    Past 24h PRN Usage:  IV dilaudid 18.5 mg w pca demand    Physical Examination:   BP: (106-120)/(60-71)   Temp:  [36.2 C (97.2 F)-37.2 C (99 F)]   Temp src: Temporal (08/09 0829)  Heart Rate:  [74-93]   Resp:  [16-22]   SpO2:  [93 %-99 %]   General appearance: Interactive, engaged and smiling today.   Lungs: clear to auscultation bilaterally sl decreased left base.  Heart: S1, S2 normal  Abdomen: soft, + BS all quadrants  Skin: warm, dry, minimal edema  Neurologic: A+O x 3    Assessment/Plan: Adamariz Gillott is a 71 yo female w  hypothyroidism, fibromyalgia, s/p chole with newly dx pancreatic caner being seen for pain management.       Upper abdominal pain  - methadone 10 mg tid started eve of 8/5 w eve dose bump to 15 mg 8/7.  Recommend on 8/10 to increase methadone to  15 mg bid and 10 mg every day if dilaudid demand remains high and or block not effective if able to complete today. For today,  continue dilaudid PCA 0.5 mg prn q 20 minutes. Calculation of IV dilaudid used over previous 24 hrs was 19 mg. With current IV to po conversion = oral dilaudid would be 15 mg q 4 hrs prn or oral morphine IR 60 mg q 4 hrs prn and poor pain control therefore not appropriate at present time.  - consult order for celiac block entered this am.Last block 8/1 no apparent affect.   -encourage tylenol 650 mg q 6 hrs  - continue decadron 4 mg bid as likely beneficial to reduce inflammation and visceral pain at this time.   - Lyrica stopped w/o affect.   -recommend f/u with Palliative care as an outpatient.    Constipation/Elimination  - continue colace , senna with hold parameters for loose stool. Miralax 17 gm bid w hold for loose BM. Per pt BM today.  - continue increased ambulation.     Nausea  -has been controlled but agree with prn zofran with reglan as second line therapy.  -Bedside swallow evaluation negative for oropharyngeal dysphagia, reglan tid started 24 hrs ago. Had Barium swallow with noted airway penetration of barium.  -encouraged frequent small meals which has helped along with the pain control.    Mood/pain  Amitriptyline 25 mg qhs.    Will continue to follow for emotional support and pain control. Updated Oncology NP. Daughter and patient coping well at present time. Kaylia plans to stay in [place]  for chemotherapy. Son returned to [place] with his wife.     [name] ANP  Palliative Care Service  Pager [phone]

## 2016-10-11 NOTE — Progress Notes (Signed)
Hematology/Oncology Progress Note:      LOS: 12 days     HPI: Amanda Galloway is a 71 y.o. female who was a transfer from OSH on 09/29/16 for workup of a pancreatic mass, as well as pain control.    Interval Hx/Subjective:    - VSS, afebrile   - Reports having a good night with minimal pain, but chart review shows PCA use throughout night  - Feels like pain is more controlled today and is in good spirits    Review of Systems   Constitutional: Positive for malaise/fatigue. Negative for chills, fever and weight loss.   Respiratory: Negative for cough and shortness of breath.    Cardiovascular: Negative for chest pain and leg swelling.   Gastrointestinal: Positive for abdominal pain. Negative for constipation, diarrhea, nausea and vomiting.     Objective:     Blood pressure 106/70, pulse 93, temperature 36.8 C (98.2 F), temperature source Temporal, resp. rate 18, height 1.68 m (5' 6.14"), weight 71.3 kg (157 lb 3 oz), SpO2 93 %.    Physical Exam   Constitutional: She is well-developed, well-nourished, and in no distress. No distress.   Sitting on side of bed, NAD, pleasant   HENT:   Head: Normocephalic.   Cardiovascular: Normal rate and regular rhythm.    Pulmonary/Chest: Effort normal and breath sounds normal. No respiratory distress. She has no wheezes.   Abdominal: Soft. Bowel sounds are normal. She exhibits no distension. There is tenderness.   Musculoskeletal: She exhibits no edema.   Neurological: She is alert.   Skin: She is not diaphoretic.   Psychiatric: Mood and affect normal.   Nursing note and vitals reviewed.    Assessment/Plan:     Amanda Galloway is a 71 y.o. female who has a newly found pancreatic mass and was transferred to [facility] from an OSH on 09/29/2016 for further workup and pain control. HPB consulted and is not a surgical candidate at this time. IR celiac block 8/1, and Dr. Ron Agee placed mediport and performed ex lap 8/2. Cycle 1 FOLFIRINOX  finished 8/5.  - IR will speak to attending who did block  about benefit of second celiac block effectiveness  - Consulting APS for ?intrathecal opioid pump    ONC-New pancreatic head mass:  - has not established care with an oncologist yet (likely will be Dr. Sharlyn Bologna)  - has not received treatment yet  - biopsy at [facility] is in patient hard chart: positive for malignancy-adenocarcinoma   - obtained staging CT abd/pelvis:  Ill defined tissue which abuts the SMA  - Appreciate HPB following  - [id number]  - s/p mediport placement and ex lap 8/2 with Dr. Ron Agee- no metastatic disease found  - cycle 1 of FOLFIRINOX finished 8/5 at 2100  - Received Neupogen on 8/8, will need for 7-14 days (hold for WBC >10k)  - Patient complaining of tinnitus, hearing test ordered: normal bilateral eustachian tubes    Pain:  - tylenol prn  - Consulting Acute Pain Service for eval of intrathecal opioid pump or other interventions that could be helpful  - Appreciate PC following:  - PCA 0.5mg  q63mins, no basal  - EKG qtc 386 on 8/7  - methadone 10mg  TID(started 8/5 2100), nightly dose 15mg   - s/p IR celiac block 8/1  - amtriptyline qhs     Hypoxia: resolved  - CT angio- negative for PE, possible signs of aspiration  - incentive spirometry    HTN/HLD:  - cont home ASA  -  cont maxzide (pt takes this for edema - has been on this long term)    Hypothyroidism:  - cont synthroid    Constipation/PUD ppx/Nausea:  - Colace BID  - senna BID  - Miralax BID  - Zofran/compazine PRN  - protonix BID  - Reglan TID  - swallow eval: some regurg recommend reglan TID, protonix BID, thin liquids w/ reg diet.   - Appreciate GI consult:  - Barium swallow ordered: No GERD or hiatal hernia noted    DVT/PPX:   - Lovenox SQ     FEN:  - PO  - Labs daily, replete PRN  - Regular diet    PT eval: RTPL w/ home PT    Full code    DISPO: Pending above    - Follow up with med onc: needs to be set up with Dr. Sharlyn Bologna  - Tentative Discharge Date: unknown  - Rx/Equipment Needed: TBD    Author: Sharyn Dross, NP  as of: 10/11/2016  at:  7:20 AM

## 2016-10-11 NOTE — Progress Notes (Signed)
Contacts: Patient and/or Family    Intervention:     SW provided family with voucher for THREE for a total of 14 parking passes.  SW explained that this voucher needs to be redeemed at parking office on the ground floor of the garage. Patient/family denied additional SW needs at this time.    Plan:   SW to continue to follow and remain available to assist as needed.    Pati Gallo LMSW  10:22 AM  10/11/2016  [place]  Ph- [phone]  Pager- [phone]

## 2016-10-11 NOTE — Plan of Care (Signed)
Problem: Safety  Goal: Patient will remain free of falls  Outcome: Maintaining      Problem: Pain/Comfort  Goal: Patient's pain or discomfort is manageable  Outcome: Progressing towards goal  Patient on PCA and methadone for pain control     Problem: Nutrition  Goal: Nutritional status is maintained or improved - Geriatric  Outcome: Maintaining      Problem: Mobility  Goal: Functional status is maintained or improved - Geriatric  Outcome: Maintaining      Problem: Psychosocial  Goal: Demonstrates ability to cope with illness  Outcome: Maintaining      Problem: Cognitive function  Goal: Cognitive function will be maintained or return to baseline  Outcome: Maintaining

## 2016-10-11 NOTE — Progress Notes (Signed)
Assumed care of patient from 0700 to 1930. Patient is independent, alert and oriented X3. Patient is on a PCA pump and was encouraged to press when was in pain. Patient's mediport needle was changed per policy. VSS throughout shift.  Please refer to patient flowsheets and eMAR  for further assessments and documentation.  Caprice Red, RN

## 2016-10-11 NOTE — Plan of Care (Signed)
Brief GI Follow-up    Reviewed results of esophagram and note presence of esophageal dysmotility and airway penetration.  Pt recently had increased dose of PPI and started on reglan.  She should continue these medications if felt to be providing relief and eat small, frequent meals with close attention made to thoroughly chewing food and regularly taking drinking small amounts during meals.  Will follow-up next week if pt remains in hospital.  Please contact GI consult service with any concerns or questions.      Cyndy Freeze, PA at 4:47 PM on 10/11/2016

## 2016-10-11 NOTE — Plan of Care (Signed)
Assumed care of patient 1900-0700 .  VSS throughout shift. Pt reports well-controlled pain using Dilaudid PCA, but did experience one episode of "jabbing" pain that subsided after a couple minutes on its own. Please refer to patient flowsheets and eMAR  for further assessments and documentation.  Tharon Aquas, RN      Cognitive function     Cognitive function will be maintained or return to baseline Maintaining        Mobility     Functional status is maintained or improved - Geriatric Maintaining        Psychosocial     Demonstrates ability to cope with illness Maintaining        Safety     Patient will remain free of falls Maintaining          Nutrition     Nutritional status is maintained or improved - Geriatric Progressing towards goal        Pain/Comfort     Patient's pain or discomfort is manageable Progressing towards goal

## 2016-10-12 ENCOUNTER — Other Ambulatory Visit: Payer: PRIVATE HEALTH INSURANCE

## 2016-10-12 LAB — COMPREHENSIVE METABOLIC PANEL
ALT: 35 U/L (ref 0–35)
AST: 28 U/L (ref 0–35)
Albumin: 3.8 g/dL (ref 3.5–5.2)
Alk Phos: 89 U/L (ref 35–105)
Anion Gap: 13 (ref 7–16)
Bilirubin,Total: <0.2 mg/dL (ref 0.0–1.2)
CO2: 26 mmol/L (ref 20–28)
Calcium: 9.3 mg/dL (ref 8.6–10.2)
Chloride: 96 mmol/L (ref 96–108)
Creatinine: 0.75 mg/dL (ref 0.51–0.95)
GFR,Black: 93 *
GFR,Caucasian: 81 *
Glucose: 85 mg/dL (ref 60–99)
Lab: 22 mg/dL — ABNORMAL HIGH (ref 6–20)
Potassium: 4.7 mmol/L (ref 3.3–5.1)
Sodium: 135 mmol/L (ref 133–145)
Total Protein: 6 g/dL — ABNORMAL LOW (ref 6.3–7.7)

## 2016-10-12 LAB — CBC AND DIFFERENTIAL
Baso # K/uL: 0 10*3/uL (ref 0.0–0.1)
Basophil %: 0 %
Eos # K/uL: 1.6 10*3/uL — ABNORMAL HIGH (ref 0.0–0.4)
Eosinophil %: 5.2 %
Hematocrit: 29 % — ABNORMAL LOW (ref 34–45)
Hemoglobin: 9.4 g/dL — ABNORMAL LOW (ref 11.2–15.7)
Lymph # K/uL: 1.6 10*3/uL (ref 1.2–3.7)
Lymphocyte %: 5.2 %
MCH: 32 pg/cell (ref 26–32)
MCHC: 33 g/dL (ref 32–36)
MCV: 97 fL — ABNORMAL HIGH (ref 79–95)
Mono # K/uL: 0.8 10*3/uL (ref 0.2–0.9)
Monocyte %: 2.6 %
Neut # K/uL: 27 10*3/uL — ABNORMAL HIGH (ref 1.6–6.1)
Nucl RBC # K/uL: 0 10*3/uL (ref 0.0–0.0)
Nucl RBC %: 0 /100 WBC (ref 0.0–0.2)
Platelets: 435 10*3/uL — ABNORMAL HIGH (ref 160–370)
RBC: 3 MIL/uL — ABNORMAL LOW (ref 3.9–5.2)
RDW: 13.4 % (ref 11.7–14.4)
Seg Neut %: 87 %
WBC: 31 10*3/uL — ABNORMAL HIGH (ref 4.0–10.0)

## 2016-10-12 LAB — RBC MORPHOLOGY

## 2016-10-12 LAB — MAGNESIUM: Magnesium: 1.7 mEq/L (ref 1.3–2.1)

## 2016-10-12 LAB — DIFF MANUAL: Diff Based On: 116 CELLS

## 2016-10-12 LAB — PHOSPHORUS: Phosphorus: 4.6 mg/dL — ABNORMAL HIGH (ref 2.7–4.5)

## 2016-10-12 MED ORDER — HYDROMORPHONE HCL 4 MG PO TABS *I*
4.0000 mg | ORAL_TABLET | ORAL | Status: DC | PRN
Start: 2016-10-12 — End: 2016-10-14
  Administered 2016-10-12 – 2016-10-14 (×13): 4 mg via ORAL
  Filled 2016-10-12 (×13): qty 1

## 2016-10-12 MED ORDER — DEXAMETHASONE 4 MG PO TABS *I*
4.0000 mg | ORAL_TABLET | Freq: Two times a day (BID) | ORAL | Status: DC
Start: 2016-10-12 — End: 2016-10-14
  Administered 2016-10-12 – 2016-10-14 (×4): 4 mg via ORAL
  Filled 2016-10-12 (×4): qty 1

## 2016-10-12 MED ORDER — LORAZEPAM 0.5 MG PO TABS *I*
0.5000 mg | ORAL_TABLET | Freq: Every evening | ORAL | Status: DC | PRN
Start: 2016-10-12 — End: 2016-10-14
  Administered 2016-10-13: 0.5 mg via ORAL
  Filled 2016-10-12: qty 1

## 2016-10-12 MED ORDER — HYDROMORPHONE HCL 2 MG/ML IJ SOLN *WRAPPED*
0.5000 mg | Freq: Four times a day (QID) | INTRAMUSCULAR | Status: DC | PRN
Start: 2016-10-12 — End: 2016-10-14
  Administered 2016-10-12: 0.5 mg via INTRAVENOUS
  Filled 2016-10-12: qty 1

## 2016-10-12 NOTE — Progress Notes (Signed)
Lifetime Care: Chart reviewed. Will continue to follow hospital course and assess for discharge needs. If issues/concerns arise over the weekend please contact on call coordinator at [phone]. Nancie Bocanegra, RN, [facility]

## 2016-10-12 NOTE — Plan of Care (Signed)
Cognitive function     Cognitive function will be maintained or return to baseline Maintaining        Mobility     Functional status is maintained or improved - Geriatric Maintaining        Nutrition     Nutritional status is maintained or improved - Geriatric Maintaining        Psychosocial     Demonstrates ability to cope with illness Maintaining        Safety     Patient will remain free of falls Maintaining          Pain/Comfort     Patient's pain or discomfort is manageable Progressing towards goal        Assumed care of patient from [place].  VSS throughout shift. Patient PCA pump d/c at 1130, patient and family made aware of PRN medications available if needed, patient tolerating being off PCA well.  Please refer to patient flowsheets and eMAR  for further assessments and documentation.  Caprice Red, RN

## 2016-10-12 NOTE — Progress Notes (Signed)
10/12/16 1618   [facility] Patient Class Review   Patient Class Review Inpatient   Patient Class Effective 09/29/16      Brigid Re, RN, BSN  Utilization Management  Pager: [phone]

## 2016-10-12 NOTE — Progress Notes (Signed)
Palliative Care Progress Note    HPI: Amanda Galloway is a 71 yo female with newly diagnosed pancreastic cancer admitted with severe abdominal pain despite celiac block.      Subjective: I was up in the middle of the night not able to sleep.  Yes I think anxiety during the night makes the pain worse.      Objective: Used 16.5 mg IV Dilaudid in last 24 hours with pain 5/10.  Was up during night not able to sleep.  Feels she is getting days nights mixed up.  Admits is anxious during night when she is awake.  Feels pain better controlled and manageable.  Nervous to have PCA stopped but is not resistant.  Agreeable to plan outlined.      Palliative Care ROS:  Pain   Moderate  Anorexia   Mild  Anxiety   Mild  Tiredness/Fatigue   Mild  ROS negative except as noted above.    Physical Examination:   BP: (100-118)/(60-82)   Temp:  [35.9 C (96.6 F)-37.4 C (99.3 F)]   Temp src: Temporal (08/10 1234)  Heart Rate:  [71-94]   Resp:  [16-18]   SpO2:  [94 %-100 %]   BP 100/60 (BP Location: Right arm)   Pulse 75   Temp 35.9 C (96.6 F) (Temporal)    Resp 16   Ht 1.68 m (5' 6.14")   Wt 71.3 kg (157 lb 3 oz)   SpO2 97%   BMI 25.26 kg/m2  General Appearance: lying in bed alert and pleasant in no distress  Head: normocepali  CV: HRRR  Chest: respiration easy  Abd: soft non distended  Ext: without edema  Neuro: alert and oriented    Assessment/Plan:    Amanda Galloway is a 71 yo female w hypothyroidism, fibromyalgia, s/p chole with newly dx pancreatic caner being seen for pain management.    Has had steady reduction of Dilaudid IV via PCA with titration up of Methadone.      Recommend stop Dialudid PCA.    Dilaudid 16.5 mg IV =  82.5 mg po Dilaudid = 6 mg q 2 hr  With Methadone expected to continue to increase in effectiveness as reaches steady state and as po Dilaudid has longer duration than IV recommend Dilaudid 4 mg po q 2 hr prn with 0.5 mg IV q 6 hr prn breakthrough pain.      Ativan 0.5 mg po at hs prn  anxiety/insomnia    Racheal Patches NP on 10/12/2016 at 3:59 PM.         .              Total Time Spent 35 minutes:   >50% of time was spent in counseling and/or coordination of care.     Racheal Patches NP on 10/12/2016 at 3:59 PM.         .  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    Malignant neoplasm of pancreas C25.9       No Known Allergies (drug, envir, food or latex)    Scheduled Meds:    dexamethasone  4 mg Oral BID    filgrastim-sndz  480 mcg Subcutaneous Q24H    methadone  10 mg Oral BID    methadone  15 mg Oral Nightly    metoclopramide  5 mg Oral TID AC    pantoprazole  40 mg Oral BID AC    amitriptyline  25 mg Oral Nightly    enoxaparin  40 mg Subcutaneous Daily    PROSOURCE NO CARB  30 mL Oral BID WC    senna  2 tablet Oral 2 times per day    polyethylene glycol  17 g Oral 2 times per day    aspirin  81 mg Oral Daily    Vitamins - senior  1 tablet Oral Daily    docusate sodium  200 mg Oral 2 times per day    triamterene-hydrochlorothiazide  1 tablet Oral Daily    levothyroxine  137 mcg Oral Daily       Continuous Infusions:       PRN Meds:  HYDROmorphone, HYDROmorphone, LORazepam, prochlorperazine, ondansetron, acetaminophen

## 2016-10-12 NOTE — Progress Notes (Signed)
Hematology/Oncology Progress Note:      LOS: 13 days     HPI: Amanda Galloway is a 71 y.o. female who was a transfer from OSH on 09/29/16 for workup of a pancreatic mass, as well as pain control.    Interval Hx/Subjective:    - VSS, afebrile   - Reports pain is 5-6/10 consistently, overall mg of dilaudid use decreasing  - x1 BM yesterday  - Pt reported desat this AM but O2 came up quickly when aroused from sleep    Review of Systems   Constitutional: Positive for malaise/fatigue. Negative for chills, fever and weight loss.   Respiratory: Negative for cough and shortness of breath.    Cardiovascular: Negative for chest pain and leg swelling.   Gastrointestinal: Positive for abdominal pain. Negative for constipation, diarrhea, nausea and vomiting.     Objective:     Blood pressure 100/60, pulse 74, temperature 36.3 C (97.3 F), temperature source Temporal, resp. rate 18, height 1.68 m (5' 6.14"), weight 71.3 kg (157 lb 3 oz), SpO2 100 %.    Physical Exam   Constitutional: She is well-developed, well-nourished, and in no distress. No distress.   Sitting on side of bed, NAD, pleasant   HENT:   Head: Normocephalic.   Cardiovascular: Normal rate and regular rhythm.    Pulmonary/Chest: Effort normal and breath sounds normal. No respiratory distress. She has no wheezes.   Abdominal: Soft. Bowel sounds are normal. She exhibits no distension. There is tenderness.   Musculoskeletal: She exhibits no edema.   Neurological: She is alert.   Skin: She is not diaphoretic.   Psychiatric: Mood and affect normal.   Nursing note and vitals reviewed.    Assessment/Plan:     Amanda Galloway is a 71 y.o. female who has a newly found pancreatic mass and was transferred to [facility] from an OSH on 09/29/2016 for further workup and pain control. HPB consulted and is not a surgical candidate at this time. IR celiac block 8/1, and Dr. Ron Galloway placed mediport and performed ex lap 8/2. Cycle 1 FOLFIRINOX  finished 8/5.   - APS to see pt today  - Switched to  PO pain regimen    ONC-New pancreatic head mass:  - has not established care with an oncologist yet (likely will be Dr. Sharlyn Galloway)  - C1 FOLFIRINOX 8/5  - biopsy at [facility] is in patient hard chart: positive for malignancy-adenocarcinoma   - obtained staging CT abd/pelvis: Ill defined tissue which abuts the SMA  - Appreciate HPB following  - [id number]  - s/p mediport placement and ex lap 8/2 with Dr. Ron Galloway- no metastatic disease found  - Received Neupogen on 8/8, will need for 7-14 days (hold for WBC >10k)  - Patient complaining of tinnitus, hearing test ordered: normal bilateral eustachian tubes    Pain:  - tylenol prn  - Appreciate Acute Pain Service consult: pending recs  - Appreciate PC following:  - PCA d/c'd (give PO dilaudid at time of shut off)  - EKG qtc 386 on 8/7  - methadone 10mg  TID(started 8/5 2100), nightly dose 15mg   - s/p IR celiac block 8/1  - amtriptyline qhs   - Ativan nightly prn  - Dilaudid PO prn  - IV dilaudid for breakthrough pain    Hypoxia: resolved  - CT angio- negative for PE, possible signs of aspiration  - incentive spirometry    HTN/HLD:  - cont home ASA  - cont maxzide (pt takes this for edema -  has been on this long term)    Hypothyroidism:  - cont synthroid    Constipation/PUD ppx/Nausea:  - Colace BID  - senna BID  - Miralax BID  - Zofran/compazine PRN  - protonix BID  - Reglan TID  - swallow eval: some regurg, recommend reglan TID, protonix BID, thin liquids w/ reg diet.   - Appreciate GI consult:  - Barium swallow ordered: No GERD or hiatal hernia noted    DVT/PPX:   - Lovenox SQ     FEN:  - Encourage PO, d/c'd IVF  - Labs daily, replete PRN; d/c'd daily KCl  - Regular diet    PT eval: RTPL w/ home PT    Full code    DISPO: Pending above    - Follow up with med onc: needs to be set up with Dr. Sharlyn Galloway  - Tentative Discharge Date: when pain adequately controlled, ?8/15  - Rx/Equipment Needed: TBD    Author: Sharyn Dross, NP  as of: 10/12/2016  at: 7:15 AM

## 2016-10-12 NOTE — Plan of Care (Signed)
Assumed care of patient 1900-0700 .  VSS throughout shift. PRN PO Dilaudid x3 this shift for abdominal pain with positive effect. Pt slept comfortably for majority of night.  Please refer to patient flowsheets and eMAR  for further assessments and documentation.  Tharon Aquas, RN    Cognitive function     Cognitive function will be maintained or return to baseline Maintaining        Mobility     Functional status is maintained or improved - Geriatric Maintaining        Nutrition     Nutritional status is maintained or improved - Geriatric Maintaining        Psychosocial     Demonstrates ability to cope with illness Maintaining        Safety     Patient will remain free of falls Maintaining          Pain/Comfort     Patient's pain or discomfort is manageable Progressing towards goal

## 2016-10-13 LAB — COMPREHENSIVE METABOLIC PANEL
ALT: 29 U/L (ref 0–35)
AST: 18 U/L (ref 0–35)
Albumin: 4 g/dL (ref 3.5–5.2)
Alk Phos: 85 U/L (ref 35–105)
Anion Gap: 12 (ref 7–16)
Bilirubin,Total: <0.2 mg/dL (ref 0.0–1.2)
CO2: 29 mmol/L — ABNORMAL HIGH (ref 20–28)
Calcium: 9.3 mg/dL (ref 8.6–10.2)
Chloride: 93 mmol/L — ABNORMAL LOW (ref 96–108)
Creatinine: 0.77 mg/dL (ref 0.51–0.95)
GFR,Black: 90 *
GFR,Caucasian: 78 *
Glucose: 97 mg/dL (ref 60–99)
Lab: 22 mg/dL — ABNORMAL HIGH (ref 6–20)
Potassium: 4.5 mmol/L (ref 3.3–5.1)
Sodium: 134 mmol/L (ref 133–145)
Total Protein: 6 g/dL — ABNORMAL LOW (ref 6.3–7.7)

## 2016-10-13 LAB — CBC AND DIFFERENTIAL
Baso # K/uL: 0 10*3/uL (ref 0.0–0.1)
Basophil %: 0.2 %
Eos # K/uL: 0.6 10*3/uL — ABNORMAL HIGH (ref 0.0–0.4)
Eosinophil %: 3 %
Hematocrit: 30 % — ABNORMAL LOW (ref 34–45)
Hemoglobin: 9.7 g/dL — ABNORMAL LOW (ref 11.2–15.7)
IMM Granulocytes #: 0.5 10*3/uL — ABNORMAL HIGH (ref 0.0–0.1)
IMM Granulocytes: 2.6 %
Lymph # K/uL: 3.2 10*3/uL (ref 1.2–3.7)
Lymphocyte %: 15.7 %
MCH: 32 pg/cell (ref 26–32)
MCHC: 33 g/dL (ref 32–36)
MCV: 97 fL — ABNORMAL HIGH (ref 79–95)
Mono # K/uL: 0.7 10*3/uL (ref 0.2–0.9)
Monocyte %: 3.5 %
Neut # K/uL: 15.1 10*3/uL — ABNORMAL HIGH (ref 1.6–6.1)
Nucl RBC # K/uL: 0 10*3/uL (ref 0.0–0.0)
Nucl RBC %: 0 /100 WBC (ref 0.0–0.2)
Platelets: 462 10*3/uL — ABNORMAL HIGH (ref 160–370)
RBC: 3.1 MIL/uL — ABNORMAL LOW (ref 3.9–5.2)
RDW: 13.2 % (ref 11.7–14.4)
Seg Neut %: 75 %
WBC: 20.1 10*3/uL — ABNORMAL HIGH (ref 4.0–10.0)

## 2016-10-13 LAB — MAGNESIUM: Magnesium: 1.6 mEq/L (ref 1.3–2.1)

## 2016-10-13 LAB — PHOSPHORUS: Phosphorus: 4.9 mg/dL — ABNORMAL HIGH (ref 2.7–4.5)

## 2016-10-13 MED ORDER — ASPIRIN 81 MG PO CHEW *I*
81.0000 mg | CHEWABLE_TABLET | Freq: Every day | ORAL | 0 refills | Status: AC
Start: 2016-10-13 — End: 2016-11-12

## 2016-10-13 MED ORDER — LORAZEPAM 0.5 MG PO TABS *I*
0.5000 mg | ORAL_TABLET | Freq: Every evening | ORAL | 0 refills | Status: DC | PRN
Start: 2016-10-13 — End: 2016-10-23

## 2016-10-13 MED ORDER — LEVOTHYROXINE SODIUM 137 MCG PO TABS *I*
137.0000 ug | ORAL_TABLET | Freq: Every day | ORAL | 0 refills | Status: DC
Start: 2016-10-13 — End: 2017-08-20

## 2016-10-13 MED ORDER — SENNOSIDES 8.6 MG PO TABS *I*
2.0000 | ORAL_TABLET | Freq: Two times a day (BID) | ORAL | 0 refills | Status: DC
Start: 2016-10-13 — End: 2016-11-02

## 2016-10-13 MED ORDER — POLYETHYLENE GLYCOL 3350 PO PACK 17 GM *I*
17.0000 g | PACK | Freq: Every day | ORAL | 0 refills | Status: AC
Start: 2016-10-13 — End: 2016-11-12

## 2016-10-13 MED ORDER — METHADONE HCL 5 MG PO TABS *I*
15.0000 mg | ORAL_TABLET | Freq: Every evening | ORAL | 0 refills | Status: DC
Start: 2016-10-13 — End: 2016-10-18

## 2016-10-13 MED ORDER — MULTIVITAMIN ADULTS 50+ PO TABS *I*
1.0000 | ORAL_TABLET | Freq: Every day | ORAL | 0 refills | Status: DC
Start: 2016-10-14 — End: 2018-12-22

## 2016-10-13 MED ORDER — TRIAMTERENE-HCTZ 37.5-25 MG PO TABS *I*
1.0000 | ORAL_TABLET | Freq: Every day | ORAL | 0 refills | Status: DC
Start: 2016-10-14 — End: 2017-08-20

## 2016-10-13 MED ORDER — PANTOPRAZOLE SODIUM 40 MG PO TBEC *I*
40.0000 mg | DELAYED_RELEASE_TABLET | Freq: Two times a day (BID) | ORAL | 0 refills | Status: DC
Start: 2016-10-13 — End: 2016-11-02

## 2016-10-13 MED ORDER — DOCUSATE SODIUM 100 MG PO CAPS *I*
200.0000 mg | ORAL_CAPSULE | Freq: Two times a day (BID) | ORAL | 0 refills | Status: DC
Start: 2016-10-13 — End: 2016-11-02

## 2016-10-13 MED ORDER — METHADONE HCL 10 MG PO TABS *I*
10.0000 mg | ORAL_TABLET | Freq: Two times a day (BID) | ORAL | 0 refills | Status: DC
Start: 2016-10-14 — End: 2016-10-18

## 2016-10-13 MED ORDER — AMITRIPTYLINE HCL 25 MG PO TABS *I*
25.0000 mg | ORAL_TABLET | Freq: Every evening | ORAL | 0 refills | Status: DC
Start: 2016-10-13 — End: 2016-11-02

## 2016-10-13 MED ORDER — HYDROMORPHONE HCL 4 MG PO TABS *I*
4.0000 mg | ORAL_TABLET | ORAL | 0 refills | Status: DC | PRN
Start: 2016-10-13 — End: 2017-01-01

## 2016-10-13 MED ORDER — DEXAMETHASONE 4 MG PO TABS *I*
4.0000 mg | ORAL_TABLET | Freq: Two times a day (BID) | ORAL | 0 refills | Status: DC
Start: 2016-10-14 — End: 2016-11-02

## 2016-10-13 MED ORDER — METOCLOPRAMIDE HCL 5 MG PO TABS *I*
5.0000 mg | ORAL_TABLET | Freq: Three times a day (TID) | ORAL | 0 refills | Status: DC
Start: 2016-10-13 — End: 2016-11-02

## 2016-10-13 NOTE — Progress Notes (Signed)
Hematology/Oncology Progress Note:      LOS: 14 days     HPI: Amanda Galloway is a 71 y.o. female who was a transfer from OSH on 09/29/16 for workup of a pancreatic mass, as well as pain control.    Interval Hx/Subjective:    - VSS, afebrile   - Reported having a good night and that oral pain meds are quickly effective when taken  - Admits to one sharp pain episode early this AM and when given prn found pain quickly decreased within 20 minutes, which she found encouraging  - reports BM yesterday  - ambulating around unit and hopeful for d/c tomorrow if today and tonight go well pain wise    Review of Systems   Constitutional: Positive for malaise/fatigue. Negative for chills, fever and weight loss.   Respiratory: Negative for cough and shortness of breath.    Cardiovascular: Negative for chest pain and leg swelling.   Gastrointestinal: Positive for abdominal pain (improving). Negative for constipation, diarrhea, nausea and vomiting.     Objective:     Blood pressure 105/77, pulse 84, temperature 36.3 C (97.3 F), temperature source Temporal, resp. rate 16, height 1.68 m (5' 6.14"), weight 71.3 kg (157 lb 3 oz), SpO2 100 %.    Physical Exam   Constitutional:   Laying in bed, NAD, pleasant   HENT:   Head: Normocephalic.   Cardiovascular: Normal rate and regular rhythm.    Pulmonary/Chest: Effort normal and breath sounds normal. No respiratory distress. She has no wheezes.   Abdominal: Soft. Bowel sounds are normal. She exhibits no distension. There is tenderness.   Musculoskeletal: She exhibits no edema.   Neurological: She is alert.   Skin: She is not diaphoretic.   Psychiatric: Mood and affect normal.   Smiling and in great spirits   Nursing note and vitals reviewed.    Assessment/Plan:     Amanda Galloway is a 71 y.o. female who has a newly found pancreatic mass and was transferred to [facility] from an OSH on 09/29/2016 for further workup and pain control. HPB consulted and is not a surgical candidate at this time. IR  celiac block 8/1. Dr. Ron Agee placed mediport and performed ex lap 8/2. Cycle 1 FOLFIRINOX  finished 8/5.   - Transitioned to oral pain regimen well on 8/10    ONC-New pancreatic head mass:  - has not established care with an oncologist yet (likely will be Dr. Sharlyn Bologna)  - C1 FOLFIRINOX 8/5  - biopsy at [facility] is in patient hard chart: positive for malignancy-adenocarcinoma   - obtained staging CT abd/pelvis: Ill defined tissue which abuts the SMA  - Appreciate HPB following  - [id number]  - s/p mediport placement and ex lap 8/2 with Dr. Ron Agee- no metastatic disease found  - Received Neupogen on 8/8, will need for 7-14 days (hold for WBC >10k)  - Patient complaining of tinnitus, hearing test ordered: normal bilateral eustachian tubes    Pain:  - tylenol prn  - Appreciate Acute Pain Service consult: pending recs  - Appreciate PC following:  - EKG qtc 386 on 8/7  - Methadone 10mg  TID(started 8/5 2100), nightly dose 15mg   - s/p IR celiac block 8/1  - amtriptyline qhs   - Ativan nightly prn  - PCA d/c'd 8/10  - Dilaudid PO prn  - IV dilaudid for breakthrough pain    HTN/HLD:  - cont home ASA  - cont maxzide (pt takes this for edema - has been on  this long term)    Hypothyroidism:  - cont synthroid    Constipation/PUD ppx/Nausea:  - Colace BID  - senna BID  - Miralax BID  - Zofran/compazine PRN  - protonix BID  - Reglan TID  - swallow eval: some regurg, recommend reglan TID, protonix BID, thin liquids w/ reg diet.   - Appreciate GI consult:  - Barium swallow ordered: No GERD or hiatal hernia noted    DVT/PPX:   - Lovenox SQ     FEN:  - Encourage PO  - Labs daily, replete PRN  - Regular diet    PT eval: RTPL w/ home PT    Full code    DISPO: Pending above    - Follow up with med onc: needs to be set up with Dr. Sharlyn Bologna  - Tentative Discharge Date: 8/12-8/13  - Rx/Equipment Needed: TBD    Author: Sharyn Dross, NP  as of: 10/13/2016  at: 7:37 AM

## 2016-10-13 NOTE — Progress Notes (Signed)
Palliative Care Progress Note    Patient seen by me 10/13/2016 1610  Reason for Visit: Abdominal pain; Insomnia; Anxiety; Nausea; Constipation    Subjective: LUQ abdominal pain, 5/10 severity, constant, dull occasionally jabbing, very severe around 6am this morning, decreased within 20 minutes with po hydromorphone.    Last bowel movement 2 days ago.    Review of Pain Monitoring report shows patient used hydromorphone 4mg  po x 7 = 28mg  past 24 hours, with one dose hydromorphone 0.5mg  IV yesterday afternoon.       Palliative Care ROS:  Nausea   None  Anxiety   Mild  Constipation   no    Physical Examination:   BP: (100-122)/(60-78)   Temp:  [35.9 C (96.6 F)-37.1 C (98.8 F)]   Temp src: Temporal (08/11 0819)  Heart Rate:  [73-98]   Resp:  [16-24]   SpO2:  [95 %-100 %]   Ambulatory outside room, awake, alert appears comfortable.  Respiratory effort normal.  Daughter and son present.    ECG: 10/09/16 QTc 336ms    Assessment/Plan:  71 yo f with newly diagnosed pancreatic cancer.    LUQ abdominal pain:  Methadone 10mg /10mg /15mg  po.  Methadone started at 10mg  po tid 6 days ago, and increased to 10mg /10mg /15mg  (35mg /d) 4 days ago. Do not expect significant incremental analgesic effect over 30mg /d dose.  Hydromorphone 4mg  po q2h prn.  Hydromorphone 0.5mg  IV q6h prn (note that this IV dose is equivalent to approximately half the 4mg  po dose).    Patient and family looking forward to exploring procedural analgesia options with Anesthesia Pain service.    Anxiety:  Lorazepam 0.5mg  po qhs prn.    Insomnia:  Analgesia as above.  Lorazepam 0.5mg  po qhs prn.    Nausea:  Metoclopramide 5mg  po tid qac.  Ondansetron IV first line prn.  Prochlorperazine po second line prn. (Note po bioavailability is poor, and if ineffective, may require IV dosing.)    Constipation:  Docusate, Miralax bid, senna 2 tabs daily. Discussed importance of establishing routine daily bowel regimen.    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    Malignant neoplasm of pancreas C25.9       No Known Allergies (drug, envir, food or latex)    Scheduled Meds:    dexamethasone  4 mg Oral BID    filgrastim-sndz  480 mcg Subcutaneous Q24H    methadone  10 mg Oral BID    methadone  15 mg Oral Nightly    metoclopramide  5 mg Oral TID AC    pantoprazole  40 mg Oral BID AC    amitriptyline  25 mg Oral Nightly    enoxaparin  40 mg Subcutaneous Daily    PROSOURCE NO CARB  30 mL Oral BID WC    senna  2 tablet Oral 2 times per day    polyethylene glycol  17 g Oral 2 times per day    aspirin  81 mg Oral Daily    Vitamins - senior  1 tablet Oral Daily    docusate sodium  200 mg Oral 2 times per day    triamterene-hydrochlorothiazide  1 tablet Oral Daily    levothyroxine  137 mcg Oral Daily  Continuous Infusions:       PRN Meds:  HYDROmorphone, HYDROmorphone, LORazepam, prochlorperazine, ondansetron, acetaminophen

## 2016-10-13 NOTE — Plan of Care (Signed)
Cognitive function     Cognitive function will be maintained or return to baseline Maintaining        Mobility     Functional status is maintained or improved - Geriatric Maintaining        Nutrition     Nutritional status is maintained or improved - Geriatric Maintaining        Pain/Comfort     Patient's pain or discomfort is manageable Maintaining        Psychosocial     Demonstrates ability to cope with illness Maintaining        Safety     Patient will remain free of falls Maintaining        Assumed care of patient from 0700 to 1900 . VSS throughout shift. Pt needs known to Probation officer today. Pt requesting prn po dilaudid x2 for her abdominal pain, rating her pain 5/10. Pt verbalizes that the po dilaudid is working despite consistently rating her pain as 5/10 post administration assessment. Pt has call bell within reach. Bed in the lowest position. Making needs known to Probation officer. Will continue to monitor and assess.  Please refer to patient flowsheets and eMAR  for further assessments and documentation.      Hortencia Conradi, RN

## 2016-10-14 LAB — CBC AND DIFFERENTIAL
Baso # K/uL: 0 10*3/uL (ref 0.0–0.1)
Basophil %: 0.3 %
Eos # K/uL: 0.2 10*3/uL (ref 0.0–0.4)
Eosinophil %: 2 %
Hematocrit: 31 % — ABNORMAL LOW (ref 34–45)
Hemoglobin: 10.2 g/dL — ABNORMAL LOW (ref 11.2–15.7)
IMM Granulocytes #: 0.1 10*3/uL (ref 0.0–0.1)
IMM Granulocytes: 1.1 %
Lymph # K/uL: 3.4 10*3/uL (ref 1.2–3.7)
Lymphocyte %: 29.1 %
MCH: 32 pg/cell (ref 26–32)
MCHC: 33 g/dL (ref 32–36)
MCV: 94 fL (ref 79–95)
Mono # K/uL: 0.9 10*3/uL (ref 0.2–0.9)
Monocyte %: 7.5 %
Neut # K/uL: 7.1 10*3/uL — ABNORMAL HIGH (ref 1.6–6.1)
Nucl RBC # K/uL: 0 10*3/uL (ref 0.0–0.0)
Nucl RBC %: 0 /100 WBC (ref 0.0–0.2)
Platelets: 494 10*3/uL — ABNORMAL HIGH (ref 160–370)
RBC: 3.2 MIL/uL — ABNORMAL LOW (ref 3.9–5.2)
RDW: 13.3 % (ref 11.7–14.4)
Seg Neut %: 60 %
WBC: 11.8 10*3/uL — ABNORMAL HIGH (ref 4.0–10.0)

## 2016-10-14 LAB — COMPREHENSIVE METABOLIC PANEL
ALT: 28 U/L (ref 0–35)
AST: 17 U/L (ref 0–35)
Albumin: 4.2 g/dL (ref 3.5–5.2)
Alk Phos: 88 U/L (ref 35–105)
Anion Gap: 12 (ref 7–16)
Bilirubin,Total: <0.2 mg/dL (ref 0.0–1.2)
CO2: 32 mmol/L — ABNORMAL HIGH (ref 20–28)
Calcium: 9.4 mg/dL (ref 8.6–10.2)
Chloride: 92 mmol/L — ABNORMAL LOW (ref 96–108)
Creatinine: 0.82 mg/dL (ref 0.51–0.95)
GFR,Black: 84 *
GFR,Caucasian: 73 *
Glucose: 84 mg/dL (ref 60–99)
Lab: 23 mg/dL — ABNORMAL HIGH (ref 6–20)
Potassium: 3.8 mmol/L (ref 3.3–5.1)
Sodium: 136 mmol/L (ref 133–145)
Total Protein: 6.5 g/dL (ref 6.3–7.7)

## 2016-10-14 LAB — PHOSPHORUS: Phosphorus: 4.9 mg/dL — ABNORMAL HIGH (ref 2.7–4.5)

## 2016-10-14 LAB — MAGNESIUM: Magnesium: 1.6 mEq/L (ref 1.3–2.1)

## 2016-10-14 MED ORDER — HEPARIN LOCK FLUSH 10 UNIT/ML IJ SOLN WRAPPED *I*
5.0000 mL | INTRAVENOUS | Status: DC | PRN
Start: 2016-10-14 — End: 2016-10-14
  Administered 2016-10-14: 50 [IU]
  Filled 2016-10-14: qty 5

## 2016-10-14 MED ORDER — FILGRASTIM-SNDZ (ZARXIO) 480 MCG/0.8ML IJ SOSY *I*
480.0000 ug | PREFILLED_SYRINGE | INTRAMUSCULAR | 0 refills | Status: DC
Start: 2016-10-14 — End: 2016-10-14

## 2016-10-14 NOTE — Discharge Summary (Addendum)
Name: Amanda Galloway MRN: [id number] DOB: 1945/07/23     Admit Date: 09/29/2016   Date of Discharge: 10/14/2016  Hospital Location: [facility]  Patient was accepted for discharge to   Home or Self Care [1]  Discharge Attending Physician: Blinda Leatherwood L     Admitting Diagnosis:  Abdominal Pain  Pancreatic cancer    Discharge Diagnoses:   Locally advanced pancreatic cancer   Refractory Epigastric/abdominal pain 2/2 above   Encounter for Cycle 1 chemotherapy with FOLFIRINOX   Leukocytosis 2/2 G-CSF   Anemia 2/2 chemotherapy   Fatigue 2/2 malignancy/chemotherapy   Electrolyte imbalances   Moderate malnutrition   Hypertension   Hypothyroidism    Procedures:   Celiac Block 10/03/16 per Int Radiology   Mediport placement 10/05/16 - Dr Ron Agee   Exploratory laparotomy 10/05/16 - Dr Ron Agee    Consultants:     CONSULTANT SERVICE   Dr Delano Metz Surgical Oncology   Everlean Patterson, NP Hospice and Palliative Medicine    Noah Delaine, AUD Audiology   Alcide Clever, MD Interventional Radiology               Hospitalization Summary    CONCISE NARRATIVE:   [name] who was a transfer from OSH on 09/29/16 for workup for a new pancreatic mass and pain control. Palliative care was consulted and patient was started on a PCA Dilaudid , she was successfully transitioned off of this and is now on Methadone, as well as Dilaudid prn.  IR did a celiac block 8/1 and was not found effective.  Dr. Ron Agee placed a mediport and performed an ex lap 8/2, no metastatic disease was found.  HPB consulted and is not a surgical candidate at this time.  She received cycle 1 of FOLFIRINOX while inpatient and was tolerated well. She received a Neupogen injection on 8/8 with robust effect and again on 8/12 prior to discharge. She will go to clinic 8/14 and have labs prior to see if she will need further dosing.    She is medically ready for discharge and will be discharged to her daughter's house. She will have close follow up by Dr.  Sharlyn Bologna who will be her medical oncologist outpatient, as well as Palliative care.    Physical Exam on Discharge:  BP 100/60 (BP Location: Left arm)   Pulse 91   Temp 36.8 C (98.2 F) (Temporal)    Resp 16   Ht 168 cm (5' 6.14")   Wt 71.3 kg (157 lb 3 oz)   SpO2 99%   BMI 25.26 kg/m2  General:  Alert and oreinted X 3 cooperative, in no distress   Head:  Normocephalic,  atraumatic   Eyes:  Conjunctiva clear.  Anicteric   Lungs:   Normal effort.  Clear to auscultation bilaterally   Chest Wall:  No tenderness or deformity.  Right chest wall mediport is without evidence of infection.    Heart:  Regular rate and rhythm   Abdomen:   Soft, non-tender,  nondistended   Extremities: No cyanosis or edema   Neurologic: Nonfocal       OTHER PROCEDURES/FINDINGS:   IR Celiac block 8/1:  Successful CT-guided alcohol celiac plexus block.     CT RESULTS:   CTA 7/28:  1. The exam is negative for pulmonary embolism. No evidence for right ventricular strain.  2. Dependent groundglass opacities in the left upper and lower lobes likely represent sequela of aspiration. The upper esophagus is mildly dilated, this theoretically  increases the risk of aspiration.  3. Small left and trace right pleural fluid with associated atelectasis.  4. Nonspecific 3 mm nodules in the right upper and middle lobe, recommend attention on follow-up imaging. There are also some small left neck base nodes as can be followed on subsequent imaging. No suspicious mediastinal adenopathy.  5. Other findings include: Right atrial enlargement, nonspecific bilateral lower lobar bronchial dilatation.    CT A/P 7/29:  Ill-defined prominent retroperitoneal soft tissue with central low attenuation suggestive of necrosis which abuts the SMA and is adjacent to the pancreatic uncinate process similar to prior MRI. Differential includes pancreatic neoplasm versus lymphadenopathy. Tissue sampling is recommend for further evaluation.    XRAY RESULTS:   CXR 8/2:  No acute  cardiopulmonary disease.    SIGNIFICANT MED CHANGES: Yes  See AVS     Signed: Domingo Pulse, NP  On: 10/14/2016  at: 12:06 PM     Attending Addendum:   I have seen and examined Amanda Galloway today with Domingo Pulse, NP today.   I have reviewed and agree with discharge note as above.   Assessment and plan reviewed and discussed with NP and patient/family.   Her pain is well controlled on methadone for past 48 hours.  No indication for further intervention from Acute Pain Service at this time.  She will follow up with Oncology next week for her next cycle of chemotherapy.   We had a discussion on her prognosis.   She understands that curability for pancreatic cancer comes thru surgery.  At present, we don't know if she will become a surgical candidate with chemotherapy.   We discussed that survival rates have been improving over the decades, but disease still considered largely incurable.  For all stages of pancreatic cancer combines, the one-year relative survival rate is 20% and the 5-year rate is < 10%.  She was appropriately distressed regarding the information.  Emotional support was provided.   Stable for discharge to home.        Blinda Leatherwood, MD  Medical Oncology Attending  [facility]  Division of Hematology/Oncology  10/14/2016 at  10:48 AM

## 2016-10-14 NOTE — Plan of Care (Signed)
Assumed care of patient [phone].  VSS throughout shift.  Received dilaudid 1x during shift with good relief.  Please refer to patient flowsheets and eMAR  for further assessments and documentation.    Hennie Duos, RN      Cognitive function     Cognitive function will be maintained or return to baseline Maintaining        Mobility     Functional status is maintained or improved - Geriatric Maintaining        Nutrition     Nutritional status is maintained or improved - Geriatric Maintaining        Pain/Comfort     Patient's pain or discomfort is manageable Maintaining        Psychosocial     Demonstrates ability to cope with illness Maintaining        Safety     Patient will remain free of falls Maintaining

## 2016-10-14 NOTE — Plan of Care (Addendum)
Pt ready for discharge; discharge and medicaton instructions reviewed with pt and daughter, understanding verbalized. Prescriptions filled at [place] and delivered to room. Home care notified at discharge, spoke with Collie Siad at [place]. Patient aware of follow up appointment. No further questions at this time, daughter to provide ride home

## 2016-10-14 NOTE — Discharge Instructions (Signed)
Brief Summary of Your Hospital Course:  Amanda Galloway, you were admitted due to a newly found pancreatic cancer, as well as pain control. While you were inpatient Interventional Radiology did a celiac block to attempt to control your pain, and unfortunately this was not successful. Dr. Ron Agee placed a mediport and performed an exploratory surgery which did not find any metastatic lesions. You completed your first cycle of chemotherapy and tolerated this well, aside from some ringing in your ears. The hearing test did not find any damage, however you may need more hearing tests if this continues with future cycles of chemo. You will need to come to the clinic either 8/13 or 8/14 for a Neupogen injection, at your appointment on 8/14 they will decide if you need further injections.     You had some issues with swallowing and GI suggested a barium swallow. This did not show reflux or a hiatal hernia at that time. It was suggested that you chew your food thoroughly in order for you to have less symptoms. They did decide to put you on protonix and reglan to assist with symptoms you may continue to experience.    Your pain has been your biggest barrier to discharge, but palliative care worked closely with you and was able to successfully transition you from IV pain medications to an effective oral regimen of Methadone (long acting) and Dilaudid (short acting as needed).     You will continue to follow with Palliative Care for your pain medications. Dr. Sharlyn Bologna will be your outpatient Oncologist who will manage your cancer treatments.    You have met all the criteria for safe discharge home. Thank you for entrusting [name] with your care.       Your instructions:  - Please follow up with all appointments.   - Please take medications as instructed.   - Make sure your bowels are moving. Pain medications can be very constipating.  - It is very important that upon discharge you continue to eat a well balanced diet, and drink  plenty of fluids to help maintain your nutrition/hydration status.   - You should also continue to participate in activity as tolerated to help maintain your strength.   - It is important to practice proper hand hygiene, washing hands with soap and water before and after meals and after using the bathroom. This will help prevent the spread of infection.    What to do after you leave the hospital:    Recommended diet: regular diet    Recommended activity: activity as tolerated    Wound Care: none needed    Call the hematology office anytime at [phone] to report any of the following:   fever greater than 100.5   chills   chest pain   shortness of breath   nausea or vomiting uncontrolled by medications   persistent or severe diarrhea   blood in stool   other bleeding   uncontrolled pain   loss of consciousness     Be prepared to give the operator your call back phone number.  If you call within 24hours of discharge you will be directed to your discharge Physician:Dr Krebs  If you call after 24hours of Discharge you will be directed to your Oncologist:  Dr Sharlyn Bologna

## 2016-10-15 ENCOUNTER — Telehealth: Payer: Self-pay | Admitting: Hematology and Oncology

## 2016-10-15 NOTE — Telephone Encounter (Signed)
Noted opened to home care.

## 2016-10-16 ENCOUNTER — Ambulatory Visit: Payer: PRIVATE HEALTH INSURANCE

## 2016-10-16 ENCOUNTER — Encounter: Payer: Self-pay | Admitting: Hematology and Oncology

## 2016-10-16 ENCOUNTER — Ambulatory Visit: Payer: PRIVATE HEALTH INSURANCE | Attending: Hematology and Oncology | Admitting: Hematology and Oncology

## 2016-10-16 VITALS — BP 122/75 | HR 82 | Temp 99.3°F | Resp 18 | Ht 65.67 in | Wt 151.6 lb

## 2016-10-16 DIAGNOSIS — C259 Malignant neoplasm of pancreas, unspecified: Secondary | ICD-10-CM

## 2016-10-16 LAB — CBC AND DIFFERENTIAL
Baso # K/uL: 0.1 10*3/uL (ref 0.0–0.1)
Basophil %: 0.3 %
Eos # K/uL: 0.2 10*3/uL (ref 0.0–0.4)
Eosinophil %: 1.1 %
Hematocrit: 32 % — ABNORMAL LOW (ref 34–45)
Hemoglobin: 10.8 g/dL — ABNORMAL LOW (ref 11.2–15.7)
IMM Granulocytes #: 0.5 10*3/uL — ABNORMAL HIGH (ref 0.0–0.1)
IMM Granulocytes: 2.7 %
Lymph # K/uL: 1.7 10*3/uL (ref 1.2–3.7)
Lymphocyte %: 9.4 %
MCH: 32 pg/cell (ref 26–32)
MCHC: 34 g/dL (ref 32–36)
MCV: 95 fL (ref 79–95)
Mono # K/uL: 0.7 10*3/uL (ref 0.2–0.9)
Monocyte %: 4.2 %
Neut # K/uL: 14.5 10*3/uL — ABNORMAL HIGH (ref 1.6–6.1)
Nucl RBC # K/uL: 0 10*3/uL (ref 0.0–0.0)
Nucl RBC %: 0 /100 WBC (ref 0.0–0.2)
Platelets: 506 10*3/uL — ABNORMAL HIGH (ref 160–370)
RBC: 3.4 MIL/uL — ABNORMAL LOW (ref 3.9–5.2)
RDW: 14 % (ref 11.7–14.4)
Seg Neut %: 82.3 %
WBC: 17.6 10*3/uL — ABNORMAL HIGH (ref 4.0–10.0)

## 2016-10-16 LAB — COMPREHENSIVE METABOLIC PANEL
ALT: 33 U/L (ref 0–35)
AST: 26 U/L (ref 0–35)
Albumin: 4.4 g/dL (ref 3.5–5.2)
Alk Phos: 123 U/L — ABNORMAL HIGH (ref 35–105)
Anion Gap: 16 (ref 7–16)
Bilirubin,Total: 0.2 mg/dL (ref 0.0–1.2)
CO2: 29 mmol/L — ABNORMAL HIGH (ref 20–28)
Calcium: 9.3 mg/dL (ref 8.6–10.2)
Chloride: 93 mmol/L — ABNORMAL LOW (ref 96–108)
Creatinine: 0.94 mg/dL (ref 0.51–0.95)
GFR,Black: 71 *
GFR,Caucasian: 62 *
Glucose: 105 mg/dL — ABNORMAL HIGH (ref 60–99)
Lab: 28 mg/dL — ABNORMAL HIGH (ref 6–20)
Potassium: 3.5 mmol/L (ref 3.3–5.1)
Sodium: 138 mmol/L (ref 133–145)
Total Protein: 7 g/dL (ref 6.3–7.7)

## 2016-10-16 LAB — CA 19 9 (EFF. 01-2011): CA 19 9 (eff. 01-2011): 474 U/mL — ABNORMAL HIGH (ref 0–35)

## 2016-10-16 LAB — NEUTROPHIL #-INSTRUMENT: Neutrophil #-Instrument: 14.5 10*3/uL

## 2016-10-16 MED ORDER — ONDANSETRON 4 MG PO TBDP *I*
4.0000 mg | ORAL_TABLET | Freq: Three times a day (TID) | ORAL | 2 refills | Status: DC | PRN
Start: 2016-10-16 — End: 2017-08-20

## 2016-10-16 MED ORDER — ESCITALOPRAM OXALATE 10 MG PO TABS *I*
10.0000 mg | ORAL_TABLET | Freq: Every day | ORAL | 2 refills | Status: DC
Start: 2016-10-16 — End: 2016-11-16

## 2016-10-16 NOTE — Progress Notes (Signed)
SL IVAD accessed with positive blood return noted. Labs drawn per orders and sent for analysis. Port flushed, heparinized, and de-accessed. Patient discharged to clinic.

## 2016-10-17 MED ORDER — HEPARIN LOCK FLUSH 100 UNIT/ML IV SOLN *WRAPPED*
500.0000 [IU] | INTRAVENOUS | 5 refills | Status: DC | PRN
Start: 2016-10-17 — End: 2017-08-20

## 2016-10-17 MED ORDER — FLUOROURACIL 50 MG/ML IV SOLN *I*
1900.0000 mg/m2 | INTRAVENOUS | 2 refills | Status: DC
Start: 2016-10-17 — End: 2017-01-18

## 2016-10-17 MED ORDER — SODIUM CHLORIDE 0.9 % INJ (FLUSH) WRAPPED *I*
10.0000 mL | 5 refills | Status: DC | PRN
Start: 2016-10-17 — End: 2017-08-20

## 2016-10-17 MED ORDER — PEGFILGRASTIM (NEULASTA) 6 MG/0.6ML SC SOSY *I*
6.0000 mg | PREFILLED_SYRINGE | Freq: Once | SUBCUTANEOUS | 3 refills | Status: DC
Start: 2016-10-17 — End: 2016-10-18

## 2016-10-17 MED ORDER — PEGFILGRASTIM (NEULASTA) 6 MG/0.6ML SC SOSY *I*
6.0000 mg | PREFILLED_SYRINGE | Freq: Once | SUBCUTANEOUS | 3 refills | Status: AC
Start: 2016-10-17 — End: 2016-10-17

## 2016-10-17 NOTE — Progress Notes (Signed)
MEDICAL ONCOLOGY PROGRESS NOTE    Reason for Evaluation: Follow-up    Diagnosis: Pancreatic adenocarcinoma    Stage: Locally advanced    Performance Status: ECOG PS 1    Diagnosis and Treatment History:  1. Over several months, patient noted worsening abdominal pain and weight loss.  Extensive work-up including EGD, colonoscopy and imaging were unrevealing.  After suggestive HIDA scan, she underwent cholecystectomy on September 06, 2016 but symptoms persisted.      2. MRCP on September 25, 2016 demonstrated 2x2cm soft tissue mass-like abnormality in celiac region, contiguous with medial margin of uncinate process of pancreas concerning for pancreatic lession vs lymphadenopathy.    3. EUS/FNA on September 27, 2016 demonstrated a mass in pancreas uncinate process and lymph node pressing celiac axis. FNA confirmed adenocarcinoma.    4. She was transferred to [facility] for additional management.  Abd/pelvis CT on September 30, 2016 revealed ill-defined prominent retroperitoneal soft tissue with central low attenuation suggestive of necrosis which abuts SMA and is adjacent to pancreatic uncinate process similar to prior MRI. Differential includes pancreatic neoplasm versus lymphadenopathy.  CT angio on September 29, 2016 showed no pulmonary embolism.  Nonspecific 3 mm nodules in right upper and middle lobe, recommend attention on follow-up imaging.      5. Diagnostic laparoscopy and port placement by Dr. Ron Agee on October 04, 2016 - no evidence of metastatic disease.    Current Treatment: FOLFIRINOX chemotherapy - Cycle 1 (in-house) on October 05, 2016.  Cycle 2 scheduled for October 19, 2016.    Interval History: Since discharge from the hospital, she remains at her frail baseline.  Still fatigued and resting most of the day.  Did go out to the mall yesterday.    Pain controlled on current regimen but notes that Dilaudid wears off quicker now.      Appetite and taste poor - she is struggling with PO intake.    Supportive daughter, [name], present -  feels that patient would benefit from anti-depressant and patient agrees.    Past Medical/Surgical History:  1. Fibromyalgia  2. Hypothyroidism  3. S/p knee surgery  4. S/p hysterectomy  5. S/p shoulder surgery  6. S/p tonsillectomy    Current Medications:  Current Outpatient Prescriptions on File Prior to Visit   Medication Sig Dispense Refill    amitriptyline (ELAVIL) 25 MG tablet Take 1 tablet (25 mg total) by mouth nightly 30 tablet 0    aspirin 81 mg chewable tablet Take 1 tablet (81 mg total) by mouth daily 30 tablet 0    dexamethasone (DECADRON) 4 MG tablet Take 1 tablet (4 mg total) by mouth 2 times daily 60 tablet 0    docusate sodium (COLACE) 100 MG capsule Take 2 capsules (200 mg total) by mouth 2 times daily 120 capsule 0    HYDROmorphone (DILAUDID) 4 MG tablet Take 1 tablet (4 mg total) by mouth every 2 hours as needed   Max daily dose: 48 mg 84 tablet 0    levothyroxine (SYNTHROID, LEVOTHROID) 137 MCG tablet Take 1 tablet (137 mcg total) by mouth daily (before breakfast) 30 tablet 0    LORazepam (ATIVAN) 0.5 MG tablet Take 1 tablet (0.5 mg total) by mouth nightly as needed for Anxiety (or insomnia)   Max daily dose: 0.5 mg 7 tablet 0    methadone (DOLOPHINE) 10 MG tablet Take 1 tablet (10 mg total) by mouth 2 times daily   Max daily dose: 20 mg 14 tablet 0  methadone (DOLOPHINE) 5 MG tablet Take 3 tablets (15 mg total) by mouth nightly   Max daily dose: 15 mg 21 tablet 0    metoclopramide (REGLAN) 5 MG tablet Take 1 tablet (5 mg total) by mouth 3 times daily (before meals) 90 tablet 0    Vitamins - senior (CENTRUM SILVER) TABS Take 1 tablet by mouth daily 30 tablet 0    pantoprazole (PROTONIX) 40 MG EC tablet Take 1 tablet (40 mg total) by mouth 2 times daily (before meals)   Swallow whole. Do not crush, break, or chew. 60 tablet 0    senna (SENOKOT) 8.6 MG tablet Take 2 tablets by mouth 2 times daily 120 tablet 0    triamterene-hydrochlorothiazide (MAXZIDE-25) 37.5-25 MG per tablet  Take 1 tablet by mouth daily 30 tablet 0    polyethylene glycol (GLYCOLAX,MIRALAX) powder packet Take 17 g by mouth daily 510 g 0     No current facility-administered medications on file prior to visit.        Social History: Divorced.  Worked in [employer].  Daughter [name]) worked on [employer] and now in home care.    Family History: Mother with breast cancer.  Father with cancer of unknown type.    Review of Systems: As per Interval History, remainder of 12-point review of systems otherwise negative    Physical Exam:  General - NAD  Vitals - per chart  HEENT - PERRL, EOMI, OP clear, MMMs  Neck - Supple, no LAD  Chest - CTAB  Heart - RRR, Nl S1/S2  Abdomen - Soft, nontender, nondistended, BS present  Extremities - No edema  Neurologic - Non-focal    Laboratory Review: CBC and CMP from October 16, 2016 reviewed    Pathology Review: As outlined above    Radiology Review: As outlined above    Assessment and Plan: 71 yo F with locally advanced pancreatic adenocarcinoma.    She is currently on 1st line FOLFIRINOX chemotherapy (with Neulasta support).  She will return later this week for cycle 2.    For her depressed mood, we will start her on Lexapro today.      She will continue to follow with Pallative Care for pain control.  Scheduled to meet with IR for possible repeat celiac block.    Follow-up prior to cycle 3 for toxicity evaluation.    Patient knows to call back with any questions, concerns or new symptoms.      Mohamedtaki A. Sharlyn Bologna, M.D.  Medical Oncology

## 2016-10-18 ENCOUNTER — Other Ambulatory Visit
Admission: RE | Admit: 2016-10-18 | Discharge: 2016-10-18 | Disposition: A | Discharge status: Home / Self Care | Payer: PRIVATE HEALTH INSURANCE | Source: Ambulatory Visit | Attending: Pathology | Admitting: Pathology

## 2016-10-18 ENCOUNTER — Other Ambulatory Visit: Payer: Self-pay | Admitting: Hematology and Oncology

## 2016-10-18 ENCOUNTER — Other Ambulatory Visit: Payer: Self-pay | Admitting: Oncology

## 2016-10-18 DIAGNOSIS — C259 Malignant neoplasm of pancreas, unspecified: Secondary | ICD-10-CM

## 2016-10-18 LAB — SURGICAL PATHOLOGY

## 2016-10-18 MED ORDER — METHADONE HCL 10 MG PO TABS *I*
10.0000 mg | ORAL_TABLET | Freq: Two times a day (BID) | ORAL | 0 refills | Status: DC
Start: 2016-10-18 — End: 2016-10-30

## 2016-10-18 MED ORDER — METHADONE HCL 5 MG PO TABS *I*
15.0000 mg | ORAL_TABLET | Freq: Every evening | ORAL | 0 refills | Status: DC
Start: 2016-10-18 — End: 2016-10-30

## 2016-10-18 MED ORDER — PEGFILGRASTIM (NEULASTA) 6 MG/0.6ML SC SOSY *I*
6.0000 mg | PREFILLED_SYRINGE | Freq: Once | SUBCUTANEOUS | 3 refills | Status: DC
Start: 2016-10-18 — End: 2016-12-28

## 2016-10-18 NOTE — Telephone Encounter (Signed)
Methadone refilled to last until appointment with Palliative Care on 10/30/16, Istop reviewed.

## 2016-10-18 NOTE — Telephone Encounter (Signed)
Pt will not be seen in pall care until 8/28    Thanks    * istop not working *       Engineer, agricultural. Something went wrong while we were communicating with [place]. Something may have changed on their end that we didn't anticipate.   Don't worry, a detailed error message has automatically been sent to the support team on your behalf. No further action is necessary.   We're going to fix it as fast as we can. In the meantime, please try again or complete your I-STOP requirements manually at the [facility] website.   Home

## 2016-10-19 ENCOUNTER — Encounter: Payer: Self-pay | Admitting: Gastroenterology

## 2016-10-19 ENCOUNTER — Ambulatory Visit: Payer: PRIVATE HEALTH INSURANCE

## 2016-10-19 VITALS — BP 92/58 | HR 84 | Temp 98.6°F | Resp 18 | Wt 151.6 lb

## 2016-10-19 DIAGNOSIS — C259 Malignant neoplasm of pancreas, unspecified: Secondary | ICD-10-CM

## 2016-10-19 MED ORDER — HOME INFUSION START *I*
1.0000 | Freq: Once | Status: AC
Start: 2016-10-19 — End: 2016-10-19
  Administered 2016-10-19: 1 via INTRAVENOUS
  Filled 2016-10-19: qty 1

## 2016-10-19 MED ORDER — IRINOTECAN HCL 100 MG/5ML IV SOLN *I*
90.0000 mg/m2 | Freq: Once | INTRAVENOUS | Status: AC
Start: 2016-10-19 — End: 2016-10-19
  Administered 2016-10-19: 161 mg via INTRAVENOUS
  Filled 2016-10-19: qty 8.05

## 2016-10-19 MED ORDER — OXALIPLATIN 100 MG/20ML IV SOLN *I*
65.0000 mg/m2 | Freq: Once | INTRAVENOUS | Status: AC
Start: 2016-10-19 — End: 2016-10-19
  Administered 2016-10-19: 116 mg via INTRAVENOUS
  Filled 2016-10-19: qty 23.2

## 2016-10-19 MED ORDER — FLUOROURACIL (ADRUCIL) 1 GM/20ML IV SOLN *I*
320.0000 mg/m2 | Freq: Once | INTRAVENOUS | Status: AC
Start: 2016-10-19 — End: 2016-10-19
  Administered 2016-10-19: 573 mg via INTRAVENOUS
  Filled 2016-10-19: qty 11.46

## 2016-10-19 MED ORDER — LEUCOVORIN CALCIUM 350 MG IJ SOLR (50 MG/ML) *I*
320.0000 mg/m2 | Freq: Once | INTRAVENOUS | Status: AC
Start: 2016-10-19 — End: 2016-10-19
  Administered 2016-10-19: 573 mg via INTRAVENOUS
  Filled 2016-10-19: qty 11.46

## 2016-10-19 MED ORDER — DEXTROSE 5 % IV SOLN WRAPPED *I*
30.0000 mL/h | INTRAVENOUS | Status: DC | PRN
Start: 2016-10-19 — End: 2016-10-19
  Administered 2016-10-19: 30 mL/h via INTRAVENOUS

## 2016-10-19 MED ORDER — SODIUM CHLORIDE 0.9 % IV SOLN WRAPPED *I*
150.0000 mg | Freq: Once | INTRAVENOUS | Status: AC
Start: 2016-10-19 — End: 2016-10-19
  Administered 2016-10-19: 150 mg via INTRAVENOUS
  Filled 2016-10-19: qty 5

## 2016-10-19 MED ORDER — PALONOSETRON HCL 0.25 MG/5ML IV SOLN *I*
0.2500 mg | Freq: Once | INTRAVENOUS | Status: AC
Start: 2016-10-19 — End: 2016-10-19
  Administered 2016-10-19: 0.25 mg via INTRAVENOUS
  Filled 2016-10-19: qty 5

## 2016-10-19 MED ORDER — DEXAMETHASONE SODIUM PHOSPHATE 10 MG/ML IJ SOLN *I*
10.0000 mg | Freq: Once | INTRAMUSCULAR | Status: AC
Start: 2016-10-19 — End: 2016-10-19
  Administered 2016-10-19: 10 mg via INTRAVENOUS
  Filled 2016-10-19: qty 1

## 2016-10-19 NOTE — Progress Notes (Signed)
Consent in chart verified, labs from 8/13 reviewed and are appropriate for today's treatment. Patient tolerated Folfirinox without any issues. (first dose given inpatient). IVAD with + blood return before and after infusion. [facility] to initiated home chemo infusion. Patient received "caring for yourself after chemotherapy" packet and reinforced medication teaching and encouraged patient to call the clinic with any questions/concerns. Next apt set, discharged home in stable condition.

## 2016-10-23 ENCOUNTER — Telehealth: Payer: Self-pay | Admitting: Hematology and Oncology

## 2016-10-23 ENCOUNTER — Ambulatory Visit: Payer: PRIVATE HEALTH INSURANCE | Attending: Radiation Oncology

## 2016-10-23 ENCOUNTER — Other Ambulatory Visit: Payer: Self-pay | Admitting: Hematology and Oncology

## 2016-10-23 MED ORDER — LORAZEPAM 0.5 MG PO TABS *I*
0.5000 mg | ORAL_TABLET | Freq: Every evening | ORAL | 0 refills | Status: DC | PRN
Start: 2016-10-23 — End: 2016-10-30

## 2016-10-23 NOTE — H&P (Signed)
[name] and her daughter presented to IR clinic today to discuss potential consideration of a second celiac nerve block. She has a history of pancreatic cancer with persistent severe abdominal pain that was refractory to medical management when she was hospitalized in early August. At that time, IR was consulted and she opted for a CT guided celiac nerve block, which was performed by Dr. Marylene Buerger on 10/03/16.     Today, she and her daughter say that her pain level did not change significantly after that procedure. She continued to have unremitting pain afterward. She now has much better pain control, which includes Methadone among other medications. She is happy with her level of pain control now, however feels very "dopy" at times. She says she wanted to talk about a second celiac block to see what her chances of improved pain control would be, in order to decrease the amount of opioid medication that she's taking.    We discussed with her that there are a two main scenarios that could explain why the celiac block may not have been very beneficial. One could be that the medication did not reach the correct anatomical location. This is unlikely as the procedure was performed under CT guidance and the location confirmed post procedure. The other is perhaps that the celiac plexus is not the primary source of her persistent pain. If this is the case, it would explain why the first block did not have the anticipated effect. She should also know that the procedure is not risk-free, and there are major complications including vascular injury, bowel or liver injury which could occur. If she and her family decide to give it another try, we are happy to go ahead, however, given that her first procedure did not help, there is a high likelihood that a second would also not be very helpful.     After our discussion, she and her daughter felt that since her pain is now better controlled, to hold off on a second nerve block  at this time. If sometime in the near future they feel differently, they/their physicians may contract our department to schedule the procedure.     We are always available should there be any further questions or concerns.     Shon Baton, MD, MPH  PGY-5, Imaging Sciences

## 2016-10-23 NOTE — Telephone Encounter (Signed)
Writer confirmed Rx has been received by [facility]. They will process the Rx urgently and deliver to patient tomorrow. Also updated patient's daughter, [name]. If they do not hear from [facility] shortly, she was instructed to call them directly at [phone]. She verbalized understanding.    Thank you for allowing me to participate in the care of this patient. Please contact me with any questions.    Mckinley Jewel, PharmD  [place]  [phone]

## 2016-10-23 NOTE — Telephone Encounter (Incomplete)
Prescription for neulasta sent to [facility].   Family awar

## 2016-10-23 NOTE — Telephone Encounter (Signed)
[name] calling to make the team aware that she was told by [facility] that the neulasta prescription was still pending insurance, and it would not be available to ship overnight.   Infusion appointment given for 3:30 pm tomorrow.

## 2016-10-23 NOTE — Telephone Encounter (Signed)
Pt not seen in palliative care until 8/28    Pt is out of lorazepam

## 2016-10-24 ENCOUNTER — Ambulatory Visit: Payer: PRIVATE HEALTH INSURANCE

## 2016-10-24 ENCOUNTER — Telehealth: Payer: Self-pay | Admitting: Hematology and Oncology

## 2016-10-24 VITALS — BP 93/53 | HR 77 | Temp 97.2°F | Resp 18

## 2016-10-24 DIAGNOSIS — C259 Malignant neoplasm of pancreas, unspecified: Secondary | ICD-10-CM

## 2016-10-24 MED ORDER — PEGFILGRASTIM (NEULASTA) 6 MG/0.6ML SC SOSY *I*
6.0000 mg | PREFILLED_SYRINGE | Freq: Once | SUBCUTANEOUS | Status: AC
Start: 2016-10-24 — End: 2016-10-24
  Administered 2016-10-24: 6 mg via SUBCUTANEOUS
  Filled 2016-10-24: qty 0.6

## 2016-10-24 MED ORDER — MEDS EXIST IN THERAPY PLAN *I*
Status: DC
Start: 2016-10-24 — End: 2017-08-20

## 2016-10-24 NOTE — Progress Notes (Signed)
Received neulasta abdomen  and discharged home with daughter

## 2016-10-24 NOTE — Telephone Encounter (Signed)
Spoke with pharmacist, reviewed prescription information. They will process request and contact patient.

## 2016-10-30 ENCOUNTER — Ambulatory Visit: Payer: PRIVATE HEALTH INSURANCE | Admitting: Hospice and Palliative Medicine

## 2016-10-30 ENCOUNTER — Telehealth: Payer: Self-pay | Admitting: Hospice and Palliative Medicine

## 2016-10-30 ENCOUNTER — Encounter: Payer: Self-pay | Admitting: Hospice and Palliative Medicine

## 2016-10-30 VITALS — BP 103/64 | HR 82 | Temp 96.6°F | Resp 16 | Ht 65.98 in | Wt 149.0 lb

## 2016-10-30 DIAGNOSIS — R109 Unspecified abdominal pain: Secondary | ICD-10-CM

## 2016-10-30 DIAGNOSIS — G4700 Insomnia, unspecified: Secondary | ICD-10-CM

## 2016-10-30 DIAGNOSIS — M545 Low back pain, unspecified: Secondary | ICD-10-CM

## 2016-10-30 DIAGNOSIS — C259 Malignant neoplasm of pancreas, unspecified: Secondary | ICD-10-CM

## 2016-10-30 MED ORDER — LORAZEPAM 0.5 MG PO TABS *I*
0.5000 mg | ORAL_TABLET | Freq: Every evening | ORAL | 0 refills | Status: AC | PRN
Start: 2016-10-30 — End: 2016-11-06

## 2016-10-30 MED ORDER — METHADONE HCL 5 MG PO TABS *I*
10.0000 mg | ORAL_TABLET | Freq: Two times a day (BID) | ORAL | 0 refills | Status: DC
Start: 2016-10-30 — End: 2016-11-30

## 2016-10-30 NOTE — Progress Notes (Signed)
Palliative Care Consult    Consult Requested by: Medical oncology    Consult Reason: pain/symptoms and patient/family support    History of Present Illness: Amanda Galloway is a 71 year old female with a recent diagnosis of pancreatic cancer.  She was healthy until the spring of this year when she developed an intractable epigastric abdominal pain that began to radiate to her back.  She had a hard time finding a clear reason for this through medical workup but was eventually diagnosed as having pancreatic cancer.  There apparently is no evidence of metastatic disease at this point so that she is undergoing chemotherapy with hopes that she may become a surgical candidate and may even have a curable condition although she knows that this is considerably uncertain.  She initially had a lot of trouble getting her pain under control and had an attempt at a nerve block which did not seem to help, but she was eventually started on methadone which has helped considerably.  She is currently taking 10 mg twice a day and 15 mg at at bedtime which is holding her pretty well and she is only needing 1-2 when necessary's per day of hydromorphone.  She is tolerating chemotherapy reasonably well.  She feels sick for the day she is receiving the medicine on the day after but then is able to recover relatively quickly.  She has not been sleeping very well and is taking Decadron 4 mg twice a day which may be contributing to her insomnia.    What bothers you the most?  Pain initially but now uncertainty about my future  What helps you cope?  My face and my children and grandchildren    Past Medical History:   Diagnosis Date    Cancer     Fibromyalgia     Hypothyroidism        Past Surgical History:   Procedure Laterality Date    CHOLECYSTECTOMY, LAPAROSCOPIC  09/06/2016    HYSTERECTOMY  08/1986    Fibroids    KNEE SURGERY Right     PR INSERT TUNNELED CV CATH WITH PORT Right 10/04/2016    Procedure: Right IJ MEDIPORT Insertion;   Surgeon: Delano Metz, MD;  Location: [facility] MAIN OR;  Service: Oncology General    PR LAP,DIAGNOSTIC ABDOMEN N/A 10/04/2016    Procedure: LAPAROSCOPY DIAGNOSTIC;  Surgeon: Delano Metz, MD;  Location: [facility] MAIN OR;  Service: Oncology General    ROTATOR CUFF REPAIR Right     TONSILLECTOMY AND ADENOIDECTOMY         Family Hx:  She is one of 5 children one of whom had some kind of GI malignancy.  She has 2 children of her own.  One daughter has worked here at [place] in the oncology area and she is living with that daughter right now.  All the children and grandchildren are aware of her condition at this point.    Social Hx: She is from [place] originally but does not have a lot of faith in the doctors down there and prefers to get her care here.  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    Spiritual Hx: She is a strong [place] faith which she feels will help pull her through this and she is very well connected with her church and religion.    Allergies:   No Known Allergies (drug, envir, food or latex)    Active Medications:  Current Outpatient Prescriptions on File Prior to Visit  Medication Sig Dispense Refill    Meds Exist in Therapy Plan Peg-Filgrastim      LORazepam (ATIVAN) 0.5 MG tablet Take 1 tablet (0.5 mg total) by mouth nightly as needed for Anxiety (or insomnia)   Max daily dose: 0.5 mg 7 tablet 0    methadone (DOLOPHINE) 10 MG tablet Take 1 tablet (10 mg total) by mouth 2 times daily   Max daily dose: 20 mg 24 tablet 0    methadone (DOLOPHINE) 5 MG tablet Take 3 tablets (15 mg total) by mouth nightly   Max daily dose: 15 mg 36 tablet 0    pegfilgrastim (NEULASTA) 6 MG/0.6ML injection syringe Inject 0.6 mLs (6 mg total) into the skin once 0.6 mL 3    PREMARIN 0.625 MG tablet       ondansetron (ZOFRAN-ODT) 4 MG disintegrating tablet Take 1 tablet (4 mg total) by mouth 3 times daily as needed for Nausea   Place on top of tongue. 60 tablet 2    escitalopram (LEXAPRO) 10 MG tablet  Take 1 tablet (10 mg total) by mouth daily 30 tablet 2    amitriptyline (ELAVIL) 25 MG tablet Take 1 tablet (25 mg total) by mouth nightly 30 tablet 0    aspirin 81 mg chewable tablet Take 1 tablet (81 mg total) by mouth daily 30 tablet 0    dexamethasone (DECADRON) 4 MG tablet Take 1 tablet (4 mg total) by mouth 2 times daily 60 tablet 0    docusate sodium (COLACE) 100 MG capsule Take 2 capsules (200 mg total) by mouth 2 times daily 120 capsule 0    levothyroxine (SYNTHROID, LEVOTHROID) 137 MCG tablet Take 1 tablet (137 mcg total) by mouth daily (before breakfast) 30 tablet 0    metoclopramide (REGLAN) 5 MG tablet Take 1 tablet (5 mg total) by mouth 3 times daily (before meals) 90 tablet 0    Vitamins - senior (CENTRUM SILVER) TABS Take 1 tablet by mouth daily 30 tablet 0    pantoprazole (PROTONIX) 40 MG EC tablet Take 1 tablet (40 mg total) by mouth 2 times daily (before meals)   Swallow whole. Do not crush, break, or chew. 60 tablet 0    polyethylene glycol (GLYCOLAX,MIRALAX) powder packet Take 17 g by mouth daily 510 g 0    senna (SENOKOT) 8.6 MG tablet Take 2 tablets by mouth 2 times daily 120 tablet 0    triamterene-hydrochlorothiazide (MAXZIDE-25) 37.5-25 MG per tablet Take 1 tablet by mouth daily 30 tablet 0     No current facility-administered medications on file prior to visit.        Palliative Care Review of Systems:  ROS unobtainable/patient unresponsive no   Pain Severity: Mild (if none, erase pain descriptors)    Location upper abdomen and back   Quality boring  Constant or intermittent was constant but now is more intermittent   Aggravating factors eating made worse   Alleviating factors bowel rest   Related symptoms loss of appetite  Shortness of breath None   Anxiety None  Depression None  Drowsiness: None  Nausea Mild,   Loss of appetite Mild  Constipation None  Tiredness Mild  Loss of well-being Mild  Confusion None  Weakness  no  Dysuria  no  Fever  no  Rash no   Visual changes   no  Bleeding  no  Lymphadenopathy  no      Palliative Care Performance Status Scale:  Scale = 90    Patient Capacity:  Full    Prior Advance Care Planning:    DNI: no    DNR: no    Health Care Proxy: yes    Living Will: yes    Health Care Proxy Name/Relationship: Daughter    Current Therapies:  No ventilator, dialysis, feeding tube, or TPN.    Physical Examination:  BP 103/64 (BP Location: Right arm, Patient Position: Sitting, Cuff Size: adult)   Pulse 82   Temp 35.9 C (96.6 F) (Temporal)    Resp 16   Ht 1.676 m (5' 5.98")   Wt 67.6 kg (149 lb)   SpO2 96%   BMI 24.06 kg/m2  General appearance: appears stated age, cooperative and no distress  Throat: lips, mucosa, and tongue normal; teeth and gums normal  Neck: no adenopathy, no carotid bruit, no JVD, supple, symmetrical, trachea midline, thyroid not enlarged, symmetric, no tenderness/mass/nodules and Some scars from the insertion of her central line  Lungs: clear to auscultation bilaterally   Breasts: Without lumps  Heart: regular rate and rhythm, S1, S2 normal, no murmur, click, rub or gallop  Abdomen: Mild fullness in the upper abdomen but no clear mass.  There is dullness over the liver which does not appear to be enlarged  Extremities: extremities normal, atraumatic, no cyanosis or edema  Mental status is clear    Lab Results: All labs in the last 72 hours:No results found for this or any previous visit (from the past 72 hour(s)).     Ref Range & Units 2wk ago     CA 19 9 (eff. 03-2009) 0 - 35 U/mL 474 (H)        Ref Range & Units 2wk ago  (10/16/16) 2wk ago  (10/14/16) 2wk ago  (10/13/16)     Sodium 133 - 145 mmol/L 138 136 134    Potassium 3.3 - 5.1 mmol/L 3.5 3.8 4.5    Chloride 96 - 108 mmol/L 93 (L) 92 (L) 93 (L)    CO2 20 - 28 mmol/L 29 (H) 32 (H) 29 (H)    Anion Gap 7 - _0 UN 6 - 20 mg/dL 28 (H) 23 (H) 22 (H)    Creatinine 0.51 - 0.95 mg/dL 0.94 0.82 0.77    GFR,Caucasian * 62 73 78    GFR,Black * 71 84CM 90CM   Comments: *UNITS=mL/min/1.73  square meters    Glucose 60 - 99 mg/dL 105 (H) 84CM 97CM   Comments: Reference Ranges apply only to FASTING samples.     ADA Guidelines Blood Sugar Levels for Diagnosing Diabetes & Pre-diabetes   Normal: < 100 mg/dL   Impaired Fasting Glucose (IFG): 100-125 mg/dL   Diabetes: > 126 mg/dL on two different occasions       Calcium 8.6 - 10.2 mg/dL 9.3 9.4 9.3    Total Protein 6.3 - 7.7 g/dL 7.0 6.5 6.0 (L)    Albumin 3.5 - 5.2 g/dL 4.4 4.2 4.0    Bilirubin,Total 0.0 - 1.2 mg/dL 0.2 <0.2 <0.2    AST 0 - 35 U/L _1 ALT 0 - 35 U/L 33 28 29    Alk Phos 35 - 105 U/L 123 (H) 88 85     Ref Range & Units 2wk ago      WBC 4.0 - 10.0 THOU/uL 17.6 (H)   RBC 3.9 - 5.2 MIL/uL 3.4 (L)   Hemoglobin 11.2 - 15.7 g/dL 10.8 (L)   Hematocrit 34 - 45 %  32 (L)   MCV 79 - 95 fL 95   MCH 26 - 32 pg/cell 32   MCHC 32 - 36 g/dL 34   RDW 11.7 - 14.4 % 14.0   Platelets 160 - 370 THOU/uL 506 (H)   Seg Neut % % 82.3   Lymphocyte % % 9.4   Monocyte % % 4.2   Eosinophil % % 1.1   Basophil % % 0.3   Neut # K/uL 1.6 - 6.1 THOU/uL 14.5 (H)   Lymph # K/uL 1.2 - 3.7 THOU/uL 1.7   Mono # K/uL 0.2 - 0.9 THOU/uL 0.7           Radiology Impressions: CT from 7/29    Ill-defined prominent retroperitoneal soft tissue with central low attenuation suggestive of necrosis which abuts the SMA and is adjacent to the pancreatic uncinate process similar to prior MRI. Differential includes pancreatic neoplasm versus  lymphadenopathy. Tissue sampling is recommend for further evaluation.      Assessment/Plan:  Overall, Ms Pack has pancreatic cancer which is at least radiologically localized and potentially amenable to curative treatment although her CA-19-9 is quite elevated at 474.  She is going to be getting chemotherapy initially and then potentially reevaluated for surgical treatment.  She has had a remarkable response to methadone as a pain treatment which will be continued.  She did have a celiac block which initially did not seem to work, but is clearly  not needed at this point.  She is not taking a lot of when necessary medications and I will continue methadone at her current level of 10 twice a day and 15 mg at at bedtime.  She is currently living with her daughter up here while she is undergoing treatment and it is a very supportive environment.  She is very spiritual and religious and is very hopeful about the treatment but also has an understanding that it may not be curative.  Right now she is going to be full code as long as there is a good chance of long-term survival.  Her daughter is her healthcare proxy.  I will continue her on her current regimen and will see her back in 3 months or sooner if needed.  She is having trouble sleeping and he did advise her to take her second dose of Decadron earlier in the afternoon.  She will also take a dose of Ativan at nighttime and can have a repeat dose if needed.  I will update these prescriptions.    Family Meeting Scheduled: No    Advance Care Planning:  Reviewed?yes    Updated?no  If updated what changes were made?:    **Under the Rounding tab, please review and update HC Directives section**    Total Time Spent 75 minutes:   >50% of time was spent in counseling and/or coordination of care.

## 2016-10-30 NOTE — Telephone Encounter (Signed)
Pills changed to 5 mg of methadone rather than 10 as she is getting 10 mg bid and 15 mg at hs

## 2016-10-31 NOTE — Patient Instructions (Signed)
August 2018   Sunday Monday Tuesday Wednesday Thursday Friday Saturday                  1     CT CELIAC BLOCK THERAPEUTIC    9:40 AM   (30 min.)   [facility] IR CT5 (IRCT5)   [facility] 2     FL PORTABLE FLUOROSCOPY IN OR   10:40 AM   (30 min.)   [facility] FL C41 (C41)   [facility]     XR CHEST SINGLE VIEW   11:40 AM   (5 min.)   [facility], [facility] DR P25 (P25)   [facility] 3     4       5     6     7     NO CLINICAL DOCUMENTATION    3:30 PM   (60 min.)   Ashrafioun, Christina, AUD   [facility] 8     FL ESOPHAGRAM/BARIUM SWALLOW    9:30 AM   (45 min.)   [facility] FL13 (RM13)   [facility] 9     10     11       12     13     14     INJECTION    1:45 PM   (15 min.)   [facility] CTR, INJECTION POD   [facility]     FOLLOW UP VISIT    2:30 PM   (30 min.)   Tejani, Mohamed, MD   [facility] 15     16     17    Port Draw@8:00    FOLFIRINOX   18       19     20     21     22     23     24     25       26     27     28     29     30     31    Port Draw@10:00    PA/MD visit@10:30    FOLFIRINOX  @11:02 December 2016   Sunday Monday Tuesday Wednesday Thursday Friday Saturday                                 1       2     3     4     5     6    Palliative care 9:30   7     8       9     10     11     12     13     14    Port Draw@11:00    PA/MD visit@11:30    FOLFIRINOX  @12:00 15       16     17     18     19     20     21     22       23     24   25     26     27     28     Port Draw@9 :30    PA/MD visit@10 :00    FOLFIRINOX  @11 :00   29       01 January 2017   Sunday Monday Tuesday Wednesday Thursday Friday Saturday        1     2     3     4     5     6       7     8     9     10     11     12     13       14     15     16     17     18     19     20       21     22     23     24     25      26     27       28     29     30     31                              Your Oncology team    [phone]    Kathlen Brunswick MD  [place] PA  Elicia Lamp RN/Sue Vickki Muff RN  [employer]

## 2016-11-02 ENCOUNTER — Encounter: Payer: Self-pay | Admitting: Oncology

## 2016-11-02 ENCOUNTER — Ambulatory Visit: Payer: PRIVATE HEALTH INSURANCE | Attending: Hematology and Oncology

## 2016-11-02 ENCOUNTER — Ambulatory Visit: Payer: PRIVATE HEALTH INSURANCE

## 2016-11-02 ENCOUNTER — Encounter: Payer: Self-pay | Admitting: Gastroenterology

## 2016-11-02 ENCOUNTER — Ambulatory Visit: Payer: PRIVATE HEALTH INSURANCE | Admitting: Oncology

## 2016-11-02 VITALS — BP 103/56 | HR 57 | Temp 98.2°F | Resp 18 | Ht 65.0 in | Wt 153.0 lb

## 2016-11-02 DIAGNOSIS — C259 Malignant neoplasm of pancreas, unspecified: Secondary | ICD-10-CM

## 2016-11-02 LAB — CBC AND DIFFERENTIAL
Baso # K/uL: 0 10*3/uL (ref 0.0–0.1)
Basophil %: 0 %
Eos # K/uL: 0 10*3/uL (ref 0.0–0.4)
Eosinophil %: 0 %
Hematocrit: 33 % — ABNORMAL LOW (ref 34–45)
Hemoglobin: 10.5 g/dL — ABNORMAL LOW (ref 11.2–15.7)
Lymph # K/uL: 1.9 10*3/uL (ref 1.2–3.7)
Lymphocyte %: 9 %
MCH: 32 pg/cell (ref 26–32)
MCHC: 32 g/dL (ref 32–36)
MCV: 99 fL — ABNORMAL HIGH (ref 79–95)
Mono # K/uL: 0.4 10*3/uL (ref 0.2–0.9)
Monocyte %: 2 %
Neut # K/uL: 19.1 10*3/uL — ABNORMAL HIGH (ref 1.6–6.1)
Nucl RBC # K/uL: 0.1 10*3/uL — ABNORMAL HIGH (ref 0.0–0.0)
Nucl RBC %: 0.3 /100 WBC — ABNORMAL HIGH (ref 0.0–0.2)
Platelets: 211 10*3/uL (ref 160–370)
RBC: 3.3 MIL/uL — ABNORMAL LOW (ref 3.9–5.2)
RDW: 15.9 % — ABNORMAL HIGH (ref 11.7–14.4)
Seg Neut %: 87 %
WBC: 21.4 10*3/uL — ABNORMAL HIGH (ref 4.0–10.0)

## 2016-11-02 LAB — COMPREHENSIVE METABOLIC PANEL
ALT: 22 U/L (ref 0–35)
AST: 20 U/L (ref 0–35)
Albumin: 4.1 g/dL (ref 3.5–5.2)
Alk Phos: 144 U/L — ABNORMAL HIGH (ref 35–105)
Anion Gap: 12 (ref 7–16)
Bilirubin,Total: <0.2 mg/dL (ref 0.0–1.2)
CO2: 30 mmol/L — ABNORMAL HIGH (ref 20–28)
Calcium: 8.9 mg/dL (ref 8.6–10.2)
Chloride: 98 mmol/L (ref 96–108)
Creatinine: 0.93 mg/dL (ref 0.51–0.95)
GFR,Black: 72 *
GFR,Caucasian: 62 *
Glucose: 122 mg/dL — ABNORMAL HIGH (ref 60–99)
Lab: 24 mg/dL — ABNORMAL HIGH (ref 6–20)
Potassium: 3.6 mmol/L (ref 3.3–5.1)
Sodium: 140 mmol/L (ref 133–145)
Total Protein: 6.3 g/dL (ref 6.3–7.7)

## 2016-11-02 LAB — DIFF MANUAL
Bands %: 2 % (ref 0–10)
Diff Based On: 100 CELLS

## 2016-11-02 LAB — NEUTROPHIL #-INSTRUMENT: Neutrophil #-Instrument: 16.4 10*3/uL

## 2016-11-02 MED ORDER — DEXAMETHASONE SODIUM PHOSPHATE 10 MG/ML IJ SOLN *I*
10.0000 mg | Freq: Once | INTRAMUSCULAR | Status: AC
Start: 2016-11-02 — End: 2016-11-02
  Administered 2016-11-02: 10 mg via INTRAVENOUS
  Filled 2016-11-02: qty 1

## 2016-11-02 MED ORDER — DOCUSATE SODIUM 100 MG PO CAPS *I*
200.0000 mg | ORAL_CAPSULE | Freq: Two times a day (BID) | ORAL | 5 refills | Status: DC
Start: 2016-11-02 — End: 2017-09-25

## 2016-11-02 MED ORDER — IRINOTECAN HCL 100 MG/5ML IV SOLN *I*
90.0000 mg/m2 | Freq: Once | INTRAVENOUS | Status: AC
Start: 2016-11-02 — End: 2016-11-02
  Administered 2016-11-02: 161 mg via INTRAVENOUS
  Filled 2016-11-02: qty 8.05

## 2016-11-02 MED ORDER — SENNOSIDES 8.6 MG PO TABS *I*
2.0000 | ORAL_TABLET | Freq: Two times a day (BID) | ORAL | 5 refills | Status: DC
Start: 2016-11-02 — End: 2018-12-22

## 2016-11-02 MED ORDER — FLUOROURACIL (ADRUCIL) 1 GM/20ML IV SOLN *I*
320.0000 mg/m2 | Freq: Once | INTRAVENOUS | Status: AC
Start: 2016-11-02 — End: 2016-11-02
  Administered 2016-11-02: 573 mg via INTRAVENOUS
  Filled 2016-11-02: qty 11.46

## 2016-11-02 MED ORDER — PANTOPRAZOLE SODIUM 40 MG PO TBEC *I*
40.0000 mg | DELAYED_RELEASE_TABLET | Freq: Two times a day (BID) | ORAL | 5 refills | Status: DC
Start: 2016-11-02 — End: 2017-08-20

## 2016-11-02 MED ORDER — PALONOSETRON HCL 0.25 MG/5ML IV SOLN *I*
0.2500 mg | Freq: Once | INTRAVENOUS | Status: AC
Start: 2016-11-02 — End: 2016-11-02
  Administered 2016-11-02: 0.25 mg via INTRAVENOUS
  Filled 2016-11-02: qty 5

## 2016-11-02 MED ORDER — DEXAMETHASONE 4 MG PO TABS *I*
4.0000 mg | ORAL_TABLET | Freq: Two times a day (BID) | ORAL | 5 refills | Status: DC
Start: 2016-11-02 — End: 2017-08-20

## 2016-11-02 MED ORDER — HOME INFUSION START *I*
1.0000 | Freq: Once | Status: AC
Start: 2016-11-02 — End: 2016-11-02
  Administered 2016-11-02: 1 via INTRAVENOUS
  Filled 2016-11-02: qty 1

## 2016-11-02 MED ORDER — DEXTROSE 5 % IV SOLN WRAPPED *I*
30.0000 mL/h | INTRAVENOUS | Status: DC | PRN
Start: 2016-11-02 — End: 2016-11-02
  Administered 2016-11-02: 30 mL/h via INTRAVENOUS

## 2016-11-02 MED ORDER — SODIUM CHLORIDE 0.9 % IV SOLN WRAPPED *I*
150.0000 mg | Freq: Once | INTRAVENOUS | Status: AC
Start: 2016-11-02 — End: 2016-11-02
  Administered 2016-11-02: 150 mg via INTRAVENOUS
  Filled 2016-11-02: qty 5

## 2016-11-02 MED ORDER — METOCLOPRAMIDE HCL 5 MG PO TABS *I*
5.0000 mg | ORAL_TABLET | Freq: Three times a day (TID) | ORAL | 5 refills | Status: DC
Start: 2016-11-02 — End: 2017-08-20

## 2016-11-02 MED ORDER — LEUCOVORIN CALCIUM 350 MG IJ SOLR (50 MG/ML) *I*
320.0000 mg/m2 | Freq: Once | INTRAVENOUS | Status: AC
Start: 2016-11-02 — End: 2016-11-02
  Administered 2016-11-02: 573 mg via INTRAVENOUS
  Filled 2016-11-02: qty 11.46

## 2016-11-02 MED ORDER — OXALIPLATIN 100 MG/20ML IV SOLN *I*
65.0000 mg/m2 | Freq: Once | INTRAVENOUS | Status: AC
Start: 2016-11-02 — End: 2016-11-02
  Administered 2016-11-02: 116 mg via INTRAVENOUS
  Filled 2016-11-02: qty 23.2

## 2016-11-02 MED ORDER — AMITRIPTYLINE HCL 25 MG PO TABS *I*
25.0000 mg | ORAL_TABLET | Freq: Every evening | ORAL | 5 refills | Status: DC
Start: 2016-11-02 — End: 2017-08-20

## 2016-11-02 NOTE — Progress Notes (Signed)
Patients IVAD accessed with positive blood return, flushed well. Labs drawn and sent. Patient remains accessed for treatment. Patient discharged to clinic.

## 2016-11-02 NOTE — Progress Notes (Signed)
Patient ID: Amanda Galloway 71 y.o.  presents to the [facility] Hereditary Cancer Screening and Risk Reduction Program for hereditary cancer risk assessment.    The patient was counseled and evaluated by me for genetic testing on 11/02/16      Personal History of Cancer:  Disease Site Year/Age    Pancreatic   2018  71yo       Family History of Cancer:  A three generation pedigree was obtained at this visit.  Maternal Ancestry: English  Paternal Ancestry: [name]  Ashkenazi Jewish ancestry: No  Consanguinity: No    Relationship to patient/Name Cancer Age at Diagnosis Alive? Or   Age of Death?   Mother    breast 76 57   Father NHL 79 72       Current Outpatient Prescriptions   Medication Sig Note    amitriptyline (ELAVIL) 25 MG tablet Take 1 tablet (25 mg total) by mouth nightly     dexamethasone (DECADRON) 4 MG tablet Take 1 tablet (4 mg total) by mouth 2 times daily     docusate sodium (COLACE) 100 MG capsule Take 2 capsules (200 mg total) by mouth 2 times daily     metoclopramide (REGLAN) 5 MG tablet Take 1 tablet (5 mg total) by mouth 3 times daily (before meals)     pantoprazole (PROTONIX) 40 MG EC tablet Take 1 tablet (40 mg total) by mouth 2 times daily (before meals)   Swallow whole. Do not crush, break, or chew.     senna (SENOKOT) 8.6 MG tablet Take 2 tablets by mouth 2 times daily     LORazepam (ATIVAN) 0.5 MG tablet Take 1 tablet (0.5 mg total) by mouth nightly as needed for Anxiety (or insomnia)   Max daily dose: 1 mg May repeat X1     methadone (DOLOPHINE) 5 MG tablet Take 2 tablets (10 mg total) by mouth 2 times daily   Max daily dose: 20 mg And 15 mg at hs; total MDD 35 mg     Meds Exist in Therapy Plan Peg-Filgrastim     pegfilgrastim (NEULASTA) 6 MG/0.6ML injection syringe Inject 0.6 mLs (6 mg total) into the skin once     fluorouracil (ADRUCIL) 50 MG/ML injection Administer 3,401 mg into the vein every 14 days   Start in infusion center.  Infuse over 46 hours at home.     heparin  100 UNIT/ML injection 5 mLs (500 Units total) by Intracatheter route as needed (for line care)     sodium chloride 0.9 % flush 10 mLs by Intracatheter route as needed (for line care)     PREMARIN 0.625 MG tablet  10/16/2016: Received from: External Pharmacy    ondansetron (ZOFRAN-ODT) 4 MG disintegrating tablet Take 1 tablet (4 mg total) by mouth 3 times daily as needed for Nausea   Place on top of tongue.     escitalopram (LEXAPRO) 10 MG tablet Take 1 tablet (10 mg total) by mouth daily     aspirin 81 mg chewable tablet Take 1 tablet (81 mg total) by mouth daily     levothyroxine (SYNTHROID, LEVOTHROID) 137 MCG tablet Take 1 tablet (137 mcg total) by mouth daily (before breakfast)     Vitamins - senior (CENTRUM SILVER) TABS Take 1 tablet by mouth daily     polyethylene glycol (GLYCOLAX,MIRALAX) powder packet Take 17 g by mouth daily     triamterene-hydrochlorothiazide (MAXZIDE-25) 37.5-25 MG per tablet Take 1 tablet by mouth daily  No current facility-administered medications for this visit.      Facility-Administered Medications Ordered in Other Visits   Medication    dextrose 5 %    oxaliplatin (ELOXATIN) 116 mg in dextrose 5 % 275 mL chemo infusion    irinotecan (CAMPTOSAR) 161 mg, atropine 0.4 MG/ML 0.5 mg in dextrose 5 % 275 mL chemo infusion    leucovorin 50 mg/ml (WELLCOVORIN) 573 mg in dextrose 5 % 275 mL infusion    fluorouracil (ADRUCIL) injection 573 mg    Home Infusion Start 1 each       No Known Allergies (drug, envir, food or latex)    Social History     Social History    Marital status: Divorced     Spouse name: N/A    Number of children: N/A    Years of education: N/A     Occupational History    Not on file.     Social History Main Topics    Smoking status: Never Smoker    Smokeless tobacco: Never Used    Alcohol use No    Drug use: No    Sexual activity: Not on file     Social History Narrative       Assessment:    Amanda Galloway 71 y.o.  presents to the [facility] Hereditary Cancer Screening and Risk Reduction Program for hereditary cancer risk assessment.     Genetic counseling was provided today that covered the following topics:  - Overview of genetics and cancer genetics, inheritance patterns, and discussed of risk of an hereditary cancer syndrome.   - I explained the difference between Sporadic vs. Familial/Inherited cancer.   - Together we talked about what the results of genetic testing could mean for this patient and her family including the emotional burden, fear, anxiety, and increased screening and cancer awareness.    - I informed them about GINA, which protects against genetic discriminatation related to employment and health insurance.    -I described the possible results from genetic testing: Positive, Negative, or Uncertain Variant.  For each possible results, we discussed how that would or would not change the medical management.   - I educated her about the limitations of genetic testing. The genes we are testing for now are the most up to date and valid, but in years to come their will be advancements in genetic testing.  It is important they share the cancer history and genetic testing results with their family.     - The sample for genetic testing is obtained with 1 vial of blood.  It takes about 4 weeks for results.        For this patient, the genetic test results are needed in order to consider medical management strategies including breast surveillance, colonoscopy, upper endoscopy, surveillance for endometrial and ovarian cancer, prophylactic surgeries, and chemoprevention strategies.    Based on the personal and family history provided by the patient and from the medical record, she meets NCCN  Patient ID: Amanda Galloway 71 y.o.  presents to the [facility] Hereditary Cancer Screening and Risk Reduction Program for hereditary cancer risk assessment.    The patient was counseled and evaluated by me for genetic testing on  11/02/16      Personal History of Cancer:  Disease Site Year/Age                           Family History of Cancer:  A three generation pedigree was obtained at this visit.  Maternal Ancestry:  Paternal Ancestry:  Ashkenazi Jewish ancestry:   Consanguinity:     Relationship to patient/Name Cancer Age at Diagnosis Alive? Or   Age of Death?                                                                                                 Current Outpatient Prescriptions   Medication Sig Note    amitriptyline (ELAVIL) 25 MG tablet Take 1 tablet (25 mg total) by mouth nightly     dexamethasone (DECADRON) 4 MG tablet Take 1 tablet (4 mg total) by mouth 2 times daily     docusate sodium (COLACE) 100 MG capsule Take 2 capsules (200 mg total) by mouth 2 times daily     metoclopramide (REGLAN) 5 MG tablet Take 1 tablet (5 mg total) by mouth 3 times daily (before meals)     pantoprazole (PROTONIX) 40 MG EC tablet Take 1 tablet (40 mg total) by mouth 2 times daily (before meals)   Swallow whole. Do not crush, break, or chew.     senna (SENOKOT) 8.6 MG tablet Take 2 tablets by mouth 2 times daily     LORazepam (ATIVAN) 0.5 MG tablet Take 1 tablet (0.5 mg total) by mouth nightly as needed for Anxiety (or insomnia)   Max daily dose: 1 mg May repeat X1     methadone (DOLOPHINE) 5 MG tablet Take 2 tablets (10 mg total) by mouth 2 times daily   Max daily dose: 20 mg And 15 mg at hs; total MDD 35 mg     Meds Exist in Therapy Plan Peg-Filgrastim     pegfilgrastim (NEULASTA) 6 MG/0.6ML injection syringe Inject 0.6 mLs (6 mg total) into the skin once     fluorouracil (ADRUCIL) 50 MG/ML injection Administer 3,401 mg into the vein every 14 days   Start in infusion center.  Infuse over 46 hours at home.     heparin 100 UNIT/ML injection 5 mLs (500 Units total) by Intracatheter route as needed (for line care)     sodium chloride 0.9 % flush 10 mLs by Intracatheter route as needed (for line care)     PREMARIN 0.625 MG tablet   10/16/2016: Received from: External Pharmacy    ondansetron (ZOFRAN-ODT) 4 MG disintegrating tablet Take 1 tablet (4 mg total) by mouth 3 times daily as needed for Nausea   Place on top of tongue.     escitalopram (LEXAPRO) 10 MG tablet Take 1 tablet (10 mg total) by mouth daily     aspirin 81 mg chewable tablet Take 1 tablet (81 mg total) by mouth daily     levothyroxine (SYNTHROID, LEVOTHROID) 137 MCG tablet Take 1 tablet (137 mcg total) by mouth daily (before breakfast)     Vitamins - senior (CENTRUM SILVER) TABS Take 1 tablet by mouth daily     polyethylene glycol (GLYCOLAX,MIRALAX) powder packet Take 17 g by mouth daily     triamterene-hydrochlorothiazide (MAXZIDE-25) 37.5-25 MG per tablet Take 1 tablet by mouth daily  No current facility-administered medications for this visit.      Facility-Administered Medications Ordered in Other Visits   Medication    dextrose 5 %    oxaliplatin (ELOXATIN) 116 mg in dextrose 5 % 275 mL chemo infusion    irinotecan (CAMPTOSAR) 161 mg, atropine 0.4 MG/ML 0.5 mg in dextrose 5 % 275 mL chemo infusion    leucovorin 50 mg/ml (WELLCOVORIN) 573 mg in dextrose 5 % 275 mL infusion    fluorouracil (ADRUCIL) injection 573 mg    Home Infusion Start 1 each       No Known Allergies (drug, envir, food or latex)    Social History     Social History    Marital status: Divorced     Spouse name: N/A    Number of children: N/A    Years of education: N/A     Occupational History    Not on file.     Social History Main Topics    Smoking status: Never Smoker    Smokeless tobacco: Never Used    Alcohol use No    Drug use: No    Sexual activity: Not on file     Social History Narrative         GYN History:  Age of Menarche:   LMP:   Age of first live birth:   Hormone replacement therapy:   Breast Biopsy:   Number of daughters:  Number of Sisters:  Number of Maternal Aunts:  Number of Paternal Aunts:    Assessment:    Amanda Galloway 71 y.o.  presents to the [place] and Risk Reduction Program for hereditary cancer risk assessment.     Genetic counseling was provided today that covered the following topics:  - Overview of genetics and cancer genetics, inheritance patterns, and discussed of risk of an hereditary cancer syndrome.   - I explained the difference between Sporadic vs. Familial/Inherited cancer.   - Together we talked about what the results of genetic testing could mean for this patient and her family including the emotional burden, fear, anxiety, and increased screening and cancer awareness.    - I informed them about GINA, which protects against genetic discriminatation related to employment and health insurance.    -I described the possible results from genetic testing: Positive, Negative, or Uncertain Variant.  For each possible results, we discussed how that would or would not change the medical management.   - I educated her about the limitations of genetic testing. The genes we are testing for now are the most up to date and valid, but in years to come their will be advancements in genetic testing.  It is important they share the cancer history and genetic testing results with their family.     - The sample for genetic testing is obtained with 1 vial of blood.  It takes about 4 weeks for results.        For this patient, the genetic test results are needed in order to consider medical management strategies including breast surveillance, colonoscopy, upper endoscopy, surveillance for endometrial and ovarian cancer, prophylactic surgeries, and chemoprevention strategies.      This patient does not have a personal history of cancer. The family members affected by cancer have not undergone genetic testing and are not available for testing because they are deceased or they are not interested in genetic testing.      Based on the personal and family history provided by the  patient and from the medical record, she meets NCCN critera  for genetic testing.  Today we sent a blood sample for [facility] Analysis with [facility].    Plan:    Return to clinic in 4 weeks to review results.      Encouraged to call the office any time if problems or questions arise. She had the opportunity to ask questions.  All her questions were answered to her satisfaction. She is agreeable with this plan.    Over 60 minutes was spent with patient reviewing cancer genetics, cancer risk, cancer screening, and risk reduction.  More than 50% of this time was spent counseling the patient about coordination of care and emotional support.       Burns Spain NP  [place] [facility]  [facility]

## 2016-11-02 NOTE — Progress Notes (Signed)
MEDICAL ONCOLOGY PROGRESS NOTE    Reason for Evaluation: Follow-up    Diagnosis: Pancreatic adenocarcinoma    Stage: Locally advanced    Performance Status: ECOG PS 1    Diagnosis and Treatment History:  1. Over several months, patient noted worsening abdominal pain and weight loss.  Extensive work-up including EGD, colonoscopy and imaging were unrevealing.  After suggestive HIDA scan, she underwent cholecystectomy on September 06, 2016 but symptoms persisted.      2. MRCP on September 25, 2016 demonstrated 2x2cm soft tissue mass-like abnormality in celiac region, contiguous with medial margin of uncinate process of pancreas concerning for pancreatic lession vs lymphadenopathy.    3. EUS/FNA on September 27, 2016 demonstrated a mass in pancreas uncinate process and lymph node pressing celiac axis. FNA confirmed adenocarcinoma.    4. She was transferred to [facility] for additional management.  Abd/pelvis CT on September 30, 2016 revealed ill-defined prominent retroperitoneal soft tissue with central low attenuation suggestive of necrosis which abuts SMA and is adjacent to pancreatic uncinate process similar to prior MRI. Differential includes pancreatic neoplasm versus lymphadenopathy.  CT angio on September 29, 2016 showed no pulmonary embolism.  Nonspecific 3 mm nodules in right upper and middle lobe, recommend attention on follow-up imaging.      5. Diagnostic laparoscopy and port placement by Dr. Ron Agee on October 04, 2016 - no evidence of metastatic disease.    Current Treatment: FOLFIRINOX chemotherapy - Cycle 1 (in-house) on October 05, 2016.  Cycle 2 scheduled for October 19, 2016.  Cycle 3 on November 02, 2016.    Interval History: Since our last visit, she has received another chemotherapy treatment.  She is accompanied today by her daughter.  Chief complaint: fatigue around disconnect.     Has had sleep issues, recently saw Dr Brion Aliment, plans to try Ativan for sleep, but hasn't picked it up yet.  Used to take Excedrin PM, but has  stopped   Cold sensitivity limited to her hands   +Difficulty after disconnect x 3 days.  Feels very +weak, +fatigued.  Doesn't do a lot or eat or drink much during that time. +nausea   Doesn't like the taste of anything she drinks, would prefer IV fluids.  Plans to try grape juice because that seems to taste okay   Has been trying ethnic foods to help spark her taste, which does help.  Not eating much in general   No neuropathy   Very active   Bowels fine - no constipation, some short lived sporadic diarrhea   No mucositis   No HFS    Past Medical/Surgical History:  1. Fibromyalgia  2. Hypothyroidism  3. S/p knee surgery  4. S/p hysterectomy  5. S/p shoulder surgery  6. S/p tonsillectomy    Current Medications:  Current Outpatient Prescriptions on File Prior to Visit   Medication Sig Dispense Refill    LORazepam (ATIVAN) 0.5 MG tablet Take 1 tablet (0.5 mg total) by mouth nightly as needed for Anxiety (or insomnia)   Max daily dose: 1 mg May repeat X1 60 tablet 0    methadone (DOLOPHINE) 5 MG tablet Take 2 tablets (10 mg total) by mouth 2 times daily   Max daily dose: 20 mg And 15 mg at hs; total MDD 35 mg 210 tablet 0    Meds Exist in Therapy Plan Peg-Filgrastim      pegfilgrastim (NEULASTA) 6 MG/0.6ML injection syringe Inject 0.6 mLs (6 mg total) into the skin once 0.6 mL 3  PREMARIN 0.625 MG tablet       ondansetron (ZOFRAN-ODT) 4 MG disintegrating tablet Take 1 tablet (4 mg total) by mouth 3 times daily as needed for Nausea   Place on top of tongue. 60 tablet 2    escitalopram (LEXAPRO) 10 MG tablet Take 1 tablet (10 mg total) by mouth daily 30 tablet 2    amitriptyline (ELAVIL) 25 MG tablet Take 1 tablet (25 mg total) by mouth nightly 30 tablet 0    aspirin 81 mg chewable tablet Take 1 tablet (81 mg total) by mouth daily 30 tablet 0    dexamethasone (DECADRON) 4 MG tablet Take 1 tablet (4 mg total) by mouth 2 times daily 60 tablet 0    docusate sodium (COLACE) 100 MG capsule Take 2  capsules (200 mg total) by mouth 2 times daily 120 capsule 0    levothyroxine (SYNTHROID, LEVOTHROID) 137 MCG tablet Take 1 tablet (137 mcg total) by mouth daily (before breakfast) 30 tablet 0    metoclopramide (REGLAN) 5 MG tablet Take 1 tablet (5 mg total) by mouth 3 times daily (before meals) 90 tablet 0    Vitamins - senior (CENTRUM SILVER) TABS Take 1 tablet by mouth daily 30 tablet 0    pantoprazole (PROTONIX) 40 MG EC tablet Take 1 tablet (40 mg total) by mouth 2 times daily (before meals)   Swallow whole. Do not crush, break, or chew. 60 tablet 0    polyethylene glycol (GLYCOLAX,MIRALAX) powder packet Take 17 g by mouth daily 510 g 0    senna (SENOKOT) 8.6 MG tablet Take 2 tablets by mouth 2 times daily 120 tablet 0    triamterene-hydrochlorothiazide (MAXZIDE-25) 37.5-25 MG per tablet Take 1 tablet by mouth daily 30 tablet 0     No current facility-administered medications on file prior to visit.        Social History: Divorced.  Worked in [employer].  Daughter [name]) worked on [employer] and now in home care.    Family History: Mother with breast cancer.  Father with cancer of unknown type.    Review of Systems: As per Interval History, remainder of 12-point review of systems otherwise negative    Physical Exam:  Vitals:    11/02/16 1021   BP: 103/56   Pulse: 57   Resp: 18   Temp: 36.8 C (98.2 F)   Weight: 69.4 kg (153 lb)   Height: 165.1 cm (5\' 5" )   Gen - NAD, well groomed  HEENT - EOMI, MMMs  Neck - Supple  Chest - CTAB  Heart - RRR, Nl S1/S2  Abdomen - Soft, nontender, nondistended, BS present  Skin - port site clean and dry  Extremities - No edema  Neurologic - Non-focal        Laboratory Review: CBC and CMP from November 02, 2016 reviewed    Pathology Review: As outlined above    Radiology Review: As outlined above    Assessment and Plan: 71 yo F with locally advanced pancreatic adenocarcinoma.  She is currently on 1st line FOLFIRINOX chemotherapy (with Neulasta support).       Proceed today with Cycle 2.    Fatigue - encouraged her to use Ensure or Boost through this time when she has decreased appetite.  Discussed that fatigue related to cancer is multifactorial: cancer, chemotherapy, pain, stress, depression/anxiety, and insomnia all can contribute to cancer related fatigue. Discussed strategies to combat chemotherapy related fatigue such as prioritizing activities, planned rest periods, and regular  exercise    Nausea - encouraged preemptive use of antiemetics.  Instructed to call if symptoms persist despite use of prescribed antiemetics.    She will return in 2 weeks for her next cycle.  Will schedule scans after 4th cycle as her last CA 19-9 value had increased (today's value is pending).    Patient knows to call back with any questions, concerns or new symptoms.    Martyn Malay, PA-C  Medical Oncology Physician Assistant

## 2016-11-02 NOTE — Progress Notes (Signed)
Patient ID: Amanda Galloway 71 y.o.  presents to the [facility] Hereditary Cancer Screening and Risk Reduction Program for hereditary cancer risk assessment.    The patient was counseled and evaluated by me for genetic testing on 11/02/16      Personal History of Cancer:  Disease Site Year/Age    Pancreatic   2018  71yo       Family History of Cancer:  A three generation pedigree was obtained at this visit.  Maternal Ancestry: English  Paternal Ancestry: [name]  Ashkenazi Jewish ancestry: No  Consanguinity: No    Relationship to patient/Name Cancer Age at Diagnosis Alive? Or   Age of Death?   Mother    breast 47 21   Father NHL 45 72       Current Outpatient Prescriptions   Medication Sig Note    amitriptyline (ELAVIL) 25 MG tablet Take 1 tablet (25 mg total) by mouth nightly     dexamethasone (DECADRON) 4 MG tablet Take 1 tablet (4 mg total) by mouth 2 times daily     docusate sodium (COLACE) 100 MG capsule Take 2 capsules (200 mg total) by mouth 2 times daily     metoclopramide (REGLAN) 5 MG tablet Take 1 tablet (5 mg total) by mouth 3 times daily (before meals)     pantoprazole (PROTONIX) 40 MG EC tablet Take 1 tablet (40 mg total) by mouth 2 times daily (before meals)   Swallow whole. Do not crush, break, or chew.     senna (SENOKOT) 8.6 MG tablet Take 2 tablets by mouth 2 times daily     LORazepam (ATIVAN) 0.5 MG tablet Take 1 tablet (0.5 mg total) by mouth nightly as needed for Anxiety (or insomnia)   Max daily dose: 1 mg May repeat X1     methadone (DOLOPHINE) 5 MG tablet Take 2 tablets (10 mg total) by mouth 2 times daily   Max daily dose: 20 mg And 15 mg at hs; total MDD 35 mg     Meds Exist in Therapy Plan Peg-Filgrastim     pegfilgrastim (NEULASTA) 6 MG/0.6ML injection syringe Inject 0.6 mLs (6 mg total) into the skin once     fluorouracil (ADRUCIL) 50 MG/ML injection Administer 3,401 mg into the vein every 14 days   Start in infusion center.  Infuse over 46 hours at home.     heparin  100 UNIT/ML injection 5 mLs (500 Units total) by Intracatheter route as needed (for line care)     sodium chloride 0.9 % flush 10 mLs by Intracatheter route as needed (for line care)     PREMARIN 0.625 MG tablet  10/16/2016: Received from: External Pharmacy    ondansetron (ZOFRAN-ODT) 4 MG disintegrating tablet Take 1 tablet (4 mg total) by mouth 3 times daily as needed for Nausea   Place on top of tongue.     escitalopram (LEXAPRO) 10 MG tablet Take 1 tablet (10 mg total) by mouth daily     aspirin 81 mg chewable tablet Take 1 tablet (81 mg total) by mouth daily     levothyroxine (SYNTHROID, LEVOTHROID) 137 MCG tablet Take 1 tablet (137 mcg total) by mouth daily (before breakfast)     Vitamins - senior (CENTRUM SILVER) TABS Take 1 tablet by mouth daily     polyethylene glycol (GLYCOLAX,MIRALAX) powder packet Take 17 g by mouth daily     triamterene-hydrochlorothiazide (MAXZIDE-25) 37.5-25 MG per tablet Take 1 tablet by mouth daily  No current facility-administered medications for this visit.      Facility-Administered Medications Ordered in Other Visits   Medication    dextrose 5 %    irinotecan (CAMPTOSAR) 161 mg, atropine 0.4 MG/ML 0.5 mg in dextrose 5 % 275 mL chemo infusion    leucovorin 50 mg/ml (WELLCOVORIN) 573 mg in dextrose 5 % 275 mL infusion    fluorouracil (ADRUCIL) injection 573 mg    Home Infusion Start 1 each       No Known Allergies (drug, envir, food or latex)    Social History     Social History    Marital status: Divorced     Spouse name: N/A    Number of children: N/A    Years of education: N/A     Occupational History    Not on file.     Social History Main Topics    Smoking status: Never Smoker    Smokeless tobacco: Never Used    Alcohol use No    Drug use: No    Sexual activity: Not on file     Social History Narrative       Assessment:    Amanda Galloway 71 y.o.  presents to the [facility] Hereditary Cancer Screening and Risk Reduction Program for hereditary  cancer risk assessment.     Genetic counseling was provided today that covered the following topics:  - Overview of genetics and cancer genetics, inheritance patterns, and discussed of risk of an hereditary cancer syndrome.   - I explained the difference between Sporadic vs. Familial/Inherited cancer.   - Together we talked about what the results of genetic testing could mean for this patient and her family including the emotional burden, fear, anxiety, and increased screening and cancer awareness.    - I informed them about GINA, which protects against genetic discriminatation related to employment and health insurance.    -I described the possible results from genetic testing: Positive, Negative, or Uncertain Variant.  For each possible results, we discussed how that would or would not change the medical management.   - I educated her about the limitations of genetic testing. The genes we are testing for now are the most up to date and valid, but in years to come their will be advancements in genetic testing.  It is important they share the cancer history and genetic testing results with their family.     - The sample for genetic testing is obtained with 1 vial of blood.  It takes about 4 weeks for results.        For this patient, the genetic test results are needed in order to consider medical management strategies including breast surveillance, colonoscopy, upper endoscopy, surveillance for endometrial and ovarian cancer, prophylactic surgeries, and chemoprevention strategies.      Based on the personal and family history provided by the patient and from the medical record, she meets NCCN critera for genetic testing.  Today we sent a blood sample for [facility] Analysis with [facility].    Plan:    Return to clinic in 4 weeks to review results.      Encouraged to call the office any time if problems or questions arise. She had the opportunity to ask questions.  All her questions were answered to her satisfaction.  She is agreeable with this plan.    Over 60 minutes was spent with patient reviewing cancer genetics, cancer risk, cancer screening, and risk reduction.  More than 50% of this  time was spent counseling the patient about coordination of care and emotional support.       Burns Spain NP  [facility]  [facility]

## 2016-11-02 NOTE — Progress Notes (Signed)
Pt here for FOLFIRINOX treatment.  IVAD previously accessed from port draw, positive blood return noted prior to treatment and prior to 5FU hook-up.  Oxaliplatin infused over 120 minutes, followed by Leucovorin over 120 minutes.  Irinotecan was hung 30 minutes into Leucovorin infusion, and infused over 90 minutes.  See MAR for details.  Pt tolerated well with no issues.  5FU home hook-up done by [facility] nurse.  Pt discharged home.

## 2016-11-02 NOTE — Progress Notes (Signed)
[facility] SAME DAY TREATMENT HAND-OFF:   SITUATION:   Scheduled treatment category for today:     Cancer treatment:  Chemotherapy    Is this a new cancer treatment?: No      Consent obtained:  Yes    Location:  [facility]    Labs complete:  Yes    Within parameters: Yes      Current patient status:  Scheduled treatment    OK to treat for scheduled treatment:  Yes

## 2016-11-08 ENCOUNTER — Ambulatory Visit: Payer: PRIVATE HEALTH INSURANCE | Admitting: Internal Medicine

## 2016-11-12 ENCOUNTER — Ambulatory Visit: Payer: PRIVATE HEALTH INSURANCE

## 2016-11-15 ENCOUNTER — Telehealth: Payer: Self-pay

## 2016-11-15 NOTE — Telephone Encounter (Signed)
Spoke w/ pt. Let her know there was a mix-up with scheduling and she was with wrong provider. Let her know of new times @ 930am for port draw, 1030am with [facility], and 1115 for treatment. She confirmed new times

## 2016-11-15 NOTE — Patient Instructions (Addendum)
August 2018   Sunday Monday Tuesday Wednesday Thursday Friday Saturday                  1     CT CELIAC BLOCK THERAPEUTIC    9:40 AM   (30 min.)   [facility], [facility] IR CT5 (IRCT5)   [facility] 2     FL PORTABLE FLUOROSCOPY IN OR   10:40 AM   (30 min.)   [facility], [facility] FL C41 (C41)   [facility]     XR CHEST SINGLE VIEW   11:40 AM   (5 min.)   R[facility], [facility] DR P25 (P25)   [facility] 3     4       5     6     7     NO CLINICAL DOCUMENTATION    3:30 PM   (60 min.)   Ashrafioun, Christina, AUD   [facility] 8     FL ESOPHAGRAM/BARIUM SWALLOW    9:30 AM   (45 min.)   R[facility], [facility] FL13 (RM13)   [facility] 9     10     11       12     13     14     INJECTION    1:45 PM   (15 min.)   CAN CTR, INJECTION POD   [facility]     FOLLOW UP VISIT    2:30 PM   (30 min.)   Tejani, Mohamed, MD   [facility] 15     16     17    Port Draw@8:00    FOLFIRINOX   18       19     20     21     22     23     24     25       26     27     28     29     30     31    Port Draw@10:00    PA/MD visit@10:30    FOLFIRINOX  @11:02 December 2016   Sunday Monday Tuesday Wednesday Thursday Friday Saturday                                 1       2     3     4     5     6    Palliative care 9:30   7     8       9     10     11     12     13     14    Port Draw@11:00    PA/MD visit@11:30    FOLFIRINOX  @12:00 15       16     17     18     19     20     21  CT scan    22       23     24   [place] draw  9 am    Dr. Sharlyn Bologna  10 am     Folirinox  11 am    26     27     28     29       01 January 2017   Sunday Monday Tuesday Wednesday Thursday Friday Saturday        1     2     3     4     5     6       7     8     9     10     11     12     13       14     15     16     17     18     19     20       21     22     23      24     25     26     27       28     29     30     31                              Your Oncology team    [phone]    Kathlen Brunswick MD  [facility] [facility]  Elicia Lamp RN/Sue Vickki Muff RN  [employer]

## 2016-11-16 ENCOUNTER — Ambulatory Visit: Payer: PRIVATE HEALTH INSURANCE | Admitting: Oncology

## 2016-11-16 ENCOUNTER — Ambulatory Visit: Payer: PRIVATE HEALTH INSURANCE | Attending: Hematology and Oncology | Admitting: Oncology

## 2016-11-16 ENCOUNTER — Ambulatory Visit: Payer: PRIVATE HEALTH INSURANCE

## 2016-11-16 ENCOUNTER — Encounter: Payer: Self-pay | Admitting: Oncology

## 2016-11-16 VITALS — BP 124/64 | HR 85 | Temp 98.6°F | Resp 18 | Ht 65.0 in | Wt 154.1 lb

## 2016-11-16 DIAGNOSIS — C259 Malignant neoplasm of pancreas, unspecified: Secondary | ICD-10-CM | POA: Insufficient documentation

## 2016-11-16 DIAGNOSIS — E860 Dehydration: Secondary | ICD-10-CM | POA: Insufficient documentation

## 2016-11-16 DIAGNOSIS — Z809 Family history of malignant neoplasm, unspecified: Secondary | ICD-10-CM | POA: Insufficient documentation

## 2016-11-16 DIAGNOSIS — E039 Hypothyroidism, unspecified: Secondary | ICD-10-CM | POA: Insufficient documentation

## 2016-11-16 DIAGNOSIS — Z5111 Encounter for antineoplastic chemotherapy: Secondary | ICD-10-CM | POA: Insufficient documentation

## 2016-11-16 DIAGNOSIS — Z23 Encounter for immunization: Secondary | ICD-10-CM | POA: Insufficient documentation

## 2016-11-16 LAB — DIFF MANUAL
Bands %: 5 % (ref 0–10)
Diff Based On: 100 CELLS
Metamyelocyte %: 4 % — ABNORMAL HIGH (ref 0–1)
Myelocyte %: 7 % — ABNORMAL HIGH (ref 0–0)

## 2016-11-16 LAB — CBC AND DIFFERENTIAL
Baso # K/uL: 0 10*3/uL (ref 0.0–0.1)
Basophil %: 0 %
Eos # K/uL: 0 10*3/uL (ref 0.0–0.4)
Eosinophil %: 0 %
Hematocrit: 34 % (ref 34–45)
Hemoglobin: 11.1 g/dL — ABNORMAL LOW (ref 11.2–15.7)
Lymph # K/uL: 3.3 10*3/uL (ref 1.2–3.7)
Lymphocyte %: 13 %
MCH: 33 pg/cell — ABNORMAL HIGH (ref 26–32)
MCHC: 33 g/dL (ref 32–36)
MCV: 101 fL — ABNORMAL HIGH (ref 79–95)
Mono # K/uL: 0 10*3/uL — ABNORMAL LOW (ref 0.2–0.9)
Monocyte %: 0 %
Neut # K/uL: 19.5 10*3/uL — ABNORMAL HIGH (ref 1.6–6.1)
Nucl RBC # K/uL: 0.1 10*3/uL — ABNORMAL HIGH (ref 0.0–0.0)
Nucl RBC %: 0.5 /100 WBC — ABNORMAL HIGH (ref 0.0–0.2)
Platelets: 240 10*3/uL (ref 160–370)
RBC: 3.4 MIL/uL — ABNORMAL LOW (ref 3.9–5.2)
RDW: 18.1 % — ABNORMAL HIGH (ref 11.7–14.4)
Seg Neut %: 71 %
WBC: 25.7 10*3/uL — ABNORMAL HIGH (ref 4.0–10.0)

## 2016-11-16 LAB — COMPREHENSIVE METABOLIC PANEL
ALT: 28 U/L (ref 0–35)
AST: 22 U/L (ref 0–35)
Albumin: 4.2 g/dL (ref 3.5–5.2)
Alk Phos: 149 U/L — ABNORMAL HIGH (ref 35–105)
Anion Gap: 16 (ref 7–16)
Bilirubin,Total: <0.2 mg/dL (ref 0.0–1.2)
CO2: 27 mmol/L (ref 20–28)
Calcium: 9.3 mg/dL (ref 8.6–10.2)
Chloride: 98 mmol/L (ref 96–108)
Creatinine: 1.01 mg/dL — ABNORMAL HIGH (ref 0.51–0.95)
GFR,Black: 65 *
GFR,Caucasian: 56 * — AB
Glucose: 114 mg/dL — ABNORMAL HIGH (ref 60–99)
Lab: 26 mg/dL — ABNORMAL HIGH (ref 6–20)
Potassium: 3.7 mmol/L (ref 3.3–5.1)
Sodium: 141 mmol/L (ref 133–145)
Total Protein: 6.6 g/dL (ref 6.3–7.7)

## 2016-11-16 LAB — CA 19 9 (EFF. 01-2011): CA 19 9 (eff. 01-2011): 111 U/mL — ABNORMAL HIGH (ref 0–35)

## 2016-11-16 LAB — NEUTROPHIL #-INSTRUMENT: Neutrophil #-Instrument: 17.3 10*3/uL

## 2016-11-16 MED ORDER — FLUOROURACIL (ADRUCIL) 1 GM/20ML IV SOLN *I*
320.0000 mg/m2 | Freq: Once | INTRAVENOUS | Status: AC
Start: 2016-11-16 — End: 2016-11-16
  Administered 2016-11-16: 570 mg via INTRAVENOUS
  Filled 2016-11-16: qty 11.4

## 2016-11-16 MED ORDER — INFLUENZA VAC SPLIT HIGH-DOSE (FLUZONE) 0.5 ML IM SUSY *I*
0.5000 mL | PREFILLED_SYRINGE | Freq: Once | INTRAMUSCULAR | Status: AC
Start: 2016-11-16 — End: 2016-11-16
  Administered 2016-11-16: 0.5 mL via INTRAMUSCULAR
  Filled 2016-11-16: qty 0.5

## 2016-11-16 MED ORDER — DEXAMETHASONE SODIUM PHOSPHATE 10 MG/ML IJ SOLN *I*
10.0000 mg | Freq: Once | INTRAMUSCULAR | Status: AC
Start: 2016-11-16 — End: 2016-11-16
  Administered 2016-11-16: 10 mg via INTRAVENOUS
  Filled 2016-11-16: qty 1

## 2016-11-16 MED ORDER — IRINOTECAN HCL 100 MG/5ML IV SOLN *I*
90.0000 mg/m2 | Freq: Once | INTRAVENOUS | Status: AC
Start: 2016-11-16 — End: 2016-11-16
  Administered 2016-11-16: 160 mg via INTRAVENOUS
  Filled 2016-11-16: qty 8

## 2016-11-16 MED ORDER — LEUCOVORIN CALCIUM 350 MG IJ SOLR (50 MG/ML) *I*
320.0000 mg/m2 | Freq: Once | INTRAVENOUS | Status: AC
Start: 2016-11-16 — End: 2016-11-16
  Administered 2016-11-16: 570 mg via INTRAVENOUS
  Filled 2016-11-16: qty 11.4

## 2016-11-16 MED ORDER — SODIUM CHLORIDE 0.9 % IV SOLN WRAPPED *I*
150.0000 mg | Freq: Once | INTRAVENOUS | Status: AC
Start: 2016-11-16 — End: 2016-11-16
  Administered 2016-11-16: 150 mg via INTRAVENOUS
  Filled 2016-11-16: qty 5

## 2016-11-16 MED ORDER — OXALIPLATIN 100 MG/20ML IV SOLN *I*
65.0000 mg/m2 | Freq: Once | INTRAVENOUS | Status: AC
Start: 2016-11-16 — End: 2016-11-16
  Administered 2016-11-16: 116 mg via INTRAVENOUS
  Filled 2016-11-16: qty 23.2

## 2016-11-16 MED ORDER — HOME INFUSION START *I*
1.0000 | Freq: Once | Status: AC
Start: 2016-11-16 — End: 2016-11-16
  Administered 2016-11-16: 1 via INTRAVENOUS
  Filled 2016-11-16: qty 1

## 2016-11-16 MED ORDER — PALONOSETRON HCL 0.25 MG/5ML IV SOLN *I*
0.2500 mg | Freq: Once | INTRAVENOUS | Status: AC
Start: 2016-11-16 — End: 2016-11-16
  Administered 2016-11-16: 0.25 mg via INTRAVENOUS
  Filled 2016-11-16: qty 5

## 2016-11-16 MED ORDER — ESCITALOPRAM OXALATE 10 MG PO TABS *I*
10.0000 mg | ORAL_TABLET | Freq: Every day | ORAL | 2 refills | Status: DC
Start: 2016-11-16 — End: 2016-12-28

## 2016-11-16 MED ORDER — DEXTROSE 5 % IV SOLN WRAPPED *I*
30.0000 mL/h | INTRAVENOUS | Status: DC | PRN
Start: 2016-11-16 — End: 2016-11-17

## 2016-11-16 MED ORDER — SODIUM CHLORIDE 0.9 % IV SOLN WRAPPED *I*
1000.0000 mL | Freq: Once | Status: AC
Start: 2016-11-16 — End: 2016-11-16
  Administered 2016-11-16: 1000 mL via INTRAVENOUS

## 2016-11-16 NOTE — Progress Notes (Signed)
[place] SAME DAY TREATMENT HAND-OFF:   SITUATION:   Scheduled treatment category for today:     Cancer treatment:  Chemotherapy    Is this a new cancer treatment?: No      Consent obtained:  Yes    Location:  Media    Labs complete:  Yes    Within parameters: Yes      Current patient status:  Scheduled treatment with change    Scheduled treatment change(s):  Additional supportive care    Comments on change:  Add 1 L fluids, flu vaccine    OK to treat for scheduled treatment:  Yes

## 2016-11-16 NOTE — Progress Notes (Signed)
Current consent in chart, labs meet treatment parameter.  Patient tolerated FOLFIRINOX, IVF and Flu Shot without any adverse reactions. Flu shot placed in left arm. Site benign. Positive blood return noted from patent  IVAD before and after treatments.  [facility] initiated 5 FU home infusion.  Reinforced teaching & home anti-emetics schedualed as ordered, encouraged PO fluids. Next appointment set, discharged to home in the care of daughter.

## 2016-11-16 NOTE — Progress Notes (Signed)
Patient here for port draw. IVAD accessed with blood return noted, labs drawn and sent. Patient remains accessed for anticipated infusion today. Pt discharged to next appt.

## 2016-11-16 NOTE — Progress Notes (Signed)
MEDICAL ONCOLOGY PROGRESS NOTE    Reason for Evaluation: Follow-up    Diagnosis: Pancreatic adenocarcinoma    Stage: Locally advanced    Performance Status: ECOG PS 1    Diagnosis and Treatment History:  1. Over several months, patient noted worsening abdominal pain and weight loss.  Extensive work-up including EGD, colonoscopy and imaging were unrevealing.  After suggestive HIDA scan, she underwent cholecystectomy on September 06, 2016 but symptoms persisted.      2. MRCP on September 25, 2016 demonstrated 2x2cm soft tissue mass-like abnormality in celiac region, contiguous with medial margin of uncinate process of pancreas concerning for pancreatic lession vs lymphadenopathy.    3. EUS/FNA on September 27, 2016 demonstrated a mass in pancreas uncinate process and lymph node pressing celiac axis. FNA confirmed adenocarcinoma.    4. She was transferred to [facility] for additional management.  Abd/pelvis CT on September 30, 2016 revealed ill-defined prominent retroperitoneal soft tissue with central low attenuation suggestive of necrosis which abuts SMA and is adjacent to pancreatic uncinate process similar to prior MRI. Differential includes pancreatic neoplasm versus lymphadenopathy.  CT angio on September 29, 2016 showed no pulmonary embolism.  Nonspecific 3 mm nodules in right upper and middle lobe, recommend attention on follow-up imaging.      5. Diagnostic laparoscopy and port placement by Dr. Ron Agee on October 04, 2016 - no evidence of metastatic disease.    Current Treatment: FOLFIRINOX chemotherapy - Cycle 1 (in-house) on October 05, 2016.  Cycle 2 scheduled for October 19, 2016.  Cycle 3 on November 02, 2016.  Cycle 4 on November 16, 2016.    Interval History: Since our last visit, she has received another chemotherapy treatment.  She is accompanied today by her daughter.  Chief complaint: fatigue     Very fatigued, sleeps "all day".  Falls asleep very easily while watching TV.  Uses ativan at bedtime, wakes up once through the night.   Thinks some of the fatigue could be from boredom.  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  Not used to being home all the time.   Still able to do ADLs - cleans up after all the animals in the house - 3 cats and a dog, showers, dresses herself, does laundry, etc.  Takes the dog for a walk about three times a day.     Cold sensitivity lasts the whole two weeks, but not as bad during the second week - able to drink cold liquids by the second week   Doesn't have an appetite, not eating much.  Weight is stable   Not drinking much   Taking dex BID        Past Medical/Surgical History:  1. Fibromyalgia  2. Hypothyroidism  3. S/p knee surgery  4. S/p hysterectomy  5. S/p shoulder surgery  6. S/p tonsillectomy    Current Medications:  Current Outpatient Prescriptions on File Prior to Visit   Medication Sig Dispense Refill    amitriptyline (ELAVIL) 25 MG tablet Take 1 tablet (25 mg total) by mouth nightly 30 tablet 5    dexamethasone (DECADRON) 4 MG tablet Take 1 tablet (4 mg total) by mouth 2 times daily 60 tablet 5    docusate sodium (COLACE) 100 MG capsule Take 2 capsules (200 mg total) by mouth 2 times daily 120 capsule 5    metoclopramide (REGLAN) 5 MG tablet Take 1 tablet (5 mg total) by mouth 3 times daily (before meals) 90 tablet 5  pantoprazole (PROTONIX) 40 MG EC tablet Take 1 tablet (40 mg total) by mouth 2 times daily (before meals)   Swallow whole. Do not crush, break, or chew. 60 tablet 5    senna (SENOKOT) 8.6 MG tablet Take 2 tablets by mouth 2 times daily 120 tablet 5    methadone (DOLOPHINE) 5 MG tablet Take 2 tablets (10 mg total) by mouth 2 times daily   Max daily dose: 20 mg And 15 mg at hs; total MDD 35 mg 210 tablet 0    Meds Exist in Therapy Plan Peg-Filgrastim      pegfilgrastim (NEULASTA) 6 MG/0.6ML injection syringe Inject 0.6 mLs (6 mg total) into the skin once 0.6 mL 3    PREMARIN 0.625 MG tablet       ondansetron (ZOFRAN-ODT) 4 MG disintegrating tablet Take  1 tablet (4 mg total) by mouth 3 times daily as needed for Nausea   Place on top of tongue. 60 tablet 2    escitalopram (LEXAPRO) 10 MG tablet Take 1 tablet (10 mg total) by mouth daily 30 tablet 2    levothyroxine (SYNTHROID, LEVOTHROID) 137 MCG tablet Take 1 tablet (137 mcg total) by mouth daily (before breakfast) 30 tablet 0    Vitamins - senior (CENTRUM SILVER) TABS Take 1 tablet by mouth daily 30 tablet 0    triamterene-hydrochlorothiazide (MAXZIDE-25) 37.5-25 MG per tablet Take 1 tablet by mouth daily 30 tablet 0     No current facility-administered medications on file prior to visit.        Social History: Divorced.  Worked in Actuary.  Daughter [name]) worked on [employer] and now in home care.    Family History: Mother with breast cancer.  Father with cancer of unknown type.    Review of Systems: As per Interval History, remainder of 12-point review of systems otherwise negative    Physical Exam:  Vitals:    11/16/16 1014   BP: 124/64   Pulse: 85   Resp: 18   Temp: 37 C (98.6 F)   Weight: 69.9 kg (154 lb 1.6 oz)   Height: 165.1 cm (5\' 5" )   Gen - NAD, well groomed  HEENT - EOMI, MMMs  Neck - Supple  Chest - CTAB  Heart - RRR, Nl S1/S2  Abdomen - Soft, nontender, nondistended, BS present  Skin - port site clean and dry  Extremities - No edema  Neurologic - Non-focal    Laboratory Review: CBC and CMP from November 16, 2016 reviewed, notable for increased Cr    Pathology Review: As outlined above    Radiology Review: As outlined above    Assessment and Plan: 71 yo F with locally advanced pancreatic adenocarcinoma.  She is currently on 1st line FOLFIRINOX chemotherapy (with Neulasta support).      Proceed today with Cycle 4.    Fatigue - grade 1.  Discussed trying to develop a new daily routine or hobby while adjusting to being home and not working. Continue dexamethasone BID.  Discussed that fatigue related to cancer is multifactorial: cancer, chemotherapy, pain, stress,  depression/anxiety, and insomnia all can contribute to cancer related fatigue. Discussed strategies to combat chemotherapy related fatigue such as prioritizing activities, planned rest periods, and regular exercise    We have planned for scans after this cycle due to rising CA19-9, although level has dropped with today's lab draw, which is encouraging.     Patient knows to call back with any questions, concerns or new symptoms.  Martyn Malay, PA-C  Medical Oncology Physician Assistant

## 2016-11-19 MED ORDER — FLUOROURACIL 50 MG/ML IV SOLN *I*
1900.0000 mg/m2 | INTRAVENOUS | 2 refills | Status: DC
Start: 2016-11-19 — End: 2017-01-18

## 2016-11-21 NOTE — Patient Instructions (Addendum)
October 2018   Sunday Monday Tuesday Wednesday Thursday Friday Saturday        1     2     3     4     5     6       7     8     9     10     11     12    Port Draw @ 8:00AM    PA/MD visit @ 9:00AM    FOLFIRINOX @ 10:00AM   13       14     15     16     17     18     19     20       21     22     NEW PATIENT VISIT    2:00 PM   (60 min.)   CAN CTR, GENETICS AND RISK REDUCTION   [facility] Hereditary Risk Screen 23     24     25     26    Port Draw @ 9:00AM    PA/MD visit @ 10:00AM    FOLFIRINOX @ 11:00AM   27       28     29     30     31                               November 2018   Sunday Monday Tuesday Wednesday Thursday Friday Saturday                       1     2     3       4     5     6     7     8     9    Port Draw @ 7:30AM    PA/MD visit @ 8:30AM    FOLFIRINOX @ 9:30AM     10       11     12     13     14     15     16     17       18     19     20     21     22     23     24       25     26     27     FOLLOW UP VISIT   10:00 AM   (30 min.)   Quill, Timothy, MD   [facility] 28     29     30                    Your Oncology team    [phone]    Kathlen Brunswick MD  [place] [facility]  Elicia Lamp RN/Sue Vickki Muff RN  Melanie Conner/[facility] [employer]    FOLFIRINOX   Folfirinox  is the name for a combination of intravenous (IV) chemotherapies.  It includes Fluorouracil (5FU), Leucovorin, Irinotecan and Oxaliplatin   Given every 2 weeks.     Plan to be here for 5 1/2 hours for each treatment.     You will  get some chemotherapy at our infusion center.  Then you will go home with a pump with fluorouracil (5FU that will infuse over 46 hours (almost 2 days).  You can not get pump wet so you cannot shower.  You may wash your hair in the sink and take sponge bath.   You will need blood work done before each treatment.   You will need a medi-port placed under your skin on your chest.  This is where you will be hooked up to an IV to received your chemotherapy.   You  will also get a shot called Neulasta, on the day after the pump is taken down.  This medicine helps your body make white blood cells.    You may experience these side effects:   Fatigue   Nausea/vomiting  (Use the anti-nausea medications.  We do not want you to vomit at all.  If you have vomiting or nausea that is not well controlled, call us!)   Decreased blood counts   Red blood cells - if low, you may feel fatigue   White blood cells - if low, you may get an infection   Platelets - if low, it may take longer to stop bleeding   Hand and foot syndrome - be sure to moisture well, we recommend using a thick cream like Eucerin or bag balm   Numbness/tingling in your hands/feet (Neuropathy)   Cold sensitivity   Hair loss/ hair thinning   Allergic reaction   Taste changes   Constipation: call if no bowel movement for over 2 days   Diarrhea: call if more than 4-5 loose, watery stools in a day - SEE HANDOUT!     Mouth sores   Be careful of being out in the sun!  Use SPF of 30 or greater.  (See hand outs for more detailed list.)    Anti-Nausea Medications: Medications for home use.  The day of treatment the [place] will give you your anti-nausea medications.    Ondansetron (Zofran) 8 mg tablet by mouth, three times a day, if needed   May cause contipation    Prochlorperazine (Compazine) 10 mg tablet by mouth, every 6 hours as needed for nausea    Do not wait until you are very nauseated to start the extra pills.  The minute you feel a little 'off', start taking the medication.    If these pills are not working on your nausea, please call [phone] so we can find something that works for you!  What to expect during your infusion center appointment:    1. We ask that you limit the number of visitors to two at one time. The treatment area gets crowded quickly and limiting the number of people allows the nursing team to better care for you. It also promotes a more relaxing environment when there is less  noise.  2. Children under the age of 57 are not permitted in the [place]. This is for the safety of the child and patients alike.   3. We offer light snacks and drinks in the [place]. Please feel free to bring food from home, however, we ask that you avoid food or meals with strong odors. Alcoholic beverages are not permitted in the [place].  4. There is a television for each treatment chair. Headphones are available for use or you may bring a set with you.  5. Please inform your nurse if you have questions for your treatment team, they would be  happy to get in touch with them while you are here.

## 2016-11-23 ENCOUNTER — Ambulatory Visit
Admission: RE | Admit: 2016-11-23 | Discharge: 2016-11-23 | Disposition: A | Discharge status: Home / Self Care | Payer: PRIVATE HEALTH INSURANCE | Source: Ambulatory Visit | Attending: Oncology | Admitting: Oncology

## 2016-11-23 ENCOUNTER — Telehealth: Payer: Self-pay | Admitting: Internal Medicine

## 2016-11-23 DIAGNOSIS — C259 Malignant neoplasm of pancreas, unspecified: Secondary | ICD-10-CM | POA: Insufficient documentation

## 2016-11-23 DIAGNOSIS — R918 Other nonspecific abnormal finding of lung field: Secondary | ICD-10-CM

## 2016-11-23 MED ORDER — HEPARIN LOCK FLUSH 10 UNIT/ML IJ SOLN WRAPPED *I*
50.0000 [IU] | Freq: Once | INTRAVENOUS | Status: AC
Start: 2016-11-23 — End: 2016-11-23
  Administered 2016-11-23: 50 [IU]

## 2016-11-23 MED ORDER — IOHEXOL 350 MG/ML (OMNIPAQUE) IV SOLN *I*
1.0000 mL | Freq: Once | INTRAVENOUS | Status: AC
Start: 2016-11-23 — End: 2016-11-23
  Administered 2016-11-23: 100 mL via INTRAVENOUS

## 2016-11-23 MED ORDER — SODIUM CHLORIDE 0.9 % INJ (FLUSH) WRAPPED *I*
20.0000 mL | Freq: Once | Status: AC
Start: 2016-11-23 — End: 2016-11-23

## 2016-11-23 MED ORDER — STERILE WATER FOR IRRIGATION IR SOLN *I*
900.0000 mL | Freq: Once | Status: AC
Start: 2016-11-23 — End: 2016-11-23
  Administered 2016-11-23: 900 mL via ORAL

## 2016-11-23 NOTE — Telephone Encounter (Signed)
Received call from [facility]. Upon calling they stated that they had already communicated with another provider and to disregard the page.     Herma Ard, MD  Hematology/Oncology Fellow

## 2016-11-26 ENCOUNTER — Telehealth: Payer: Self-pay | Admitting: Hematology and Oncology

## 2016-11-26 NOTE — Telephone Encounter (Signed)
Spoke with Amanda Galloway woke up this morning with chills and fever of 101.4 F. She took Tylenol 1/2 hour prior to getting temperature. She denies urinary/respiratory symptoms, no n/v/d, not having dark urine or pale stools. She is having increased fatigue/aching, some epigastric pain(acid reflux) for couple of days now gone. Advised that she could get labs done today, Amanda Galloway didn't think that she would want to do this. Encouraged her to continue to take Tylenol as needed and to push fluids. Let her know that I will call her back if team would advise otherwise. She verbalized understanding.

## 2016-11-27 ENCOUNTER — Encounter: Payer: Self-pay | Admitting: Hematology and Oncology

## 2016-11-27 ENCOUNTER — Encounter: Payer: Self-pay | Admitting: Oncology

## 2016-11-27 ENCOUNTER — Ambulatory Visit: Payer: PRIVATE HEALTH INSURANCE

## 2016-11-27 ENCOUNTER — Ambulatory Visit: Payer: PRIVATE HEALTH INSURANCE | Admitting: Hematology and Oncology

## 2016-11-27 VITALS — BP 115/65 | HR 79 | Temp 97.7°F | Resp 18 | Ht 65.0 in | Wt 155.7 lb

## 2016-11-27 DIAGNOSIS — C259 Malignant neoplasm of pancreas, unspecified: Secondary | ICD-10-CM

## 2016-11-27 LAB — CBC AND DIFFERENTIAL
Baso # K/uL: 0.2 10*3/uL — ABNORMAL HIGH (ref 0.0–0.1)
Basophil %: 0.9 %
Eos # K/uL: 0.2 10*3/uL (ref 0.0–0.4)
Eosinophil %: 0.9 %
Hematocrit: 32 % — ABNORMAL LOW (ref 34–45)
Hemoglobin: 10.7 g/dL — ABNORMAL LOW (ref 11.2–15.7)
Lymph # K/uL: 2.4 10*3/uL (ref 1.2–3.7)
Lymphocyte %: 12.3 %
MCH: 33 pg/cell — ABNORMAL HIGH (ref 26–32)
MCHC: 33 g/dL (ref 32–36)
MCV: 100 fL — ABNORMAL HIGH (ref 79–95)
Mono # K/uL: 0.8 10*3/uL (ref 0.2–0.9)
Monocyte %: 4.4 %
Neut # K/uL: 14 10*3/uL — ABNORMAL HIGH (ref 1.6–6.1)
Nucl RBC # K/uL: 0.1 10*3/uL — ABNORMAL HIGH (ref 0.0–0.0)
Nucl RBC %: 0.5 /100 WBC — ABNORMAL HIGH (ref 0.0–0.2)
Platelets: 302 10*3/uL (ref 160–370)
RBC: 3.3 MIL/uL — ABNORMAL LOW (ref 3.9–5.2)
RDW: 17.7 % — ABNORMAL HIGH (ref 11.7–14.4)
Seg Neut %: 70 %
WBC: 18.4 10*3/uL — ABNORMAL HIGH (ref 4.0–10.0)

## 2016-11-27 LAB — COMPREHENSIVE METABOLIC PANEL
ALT: 48 U/L — ABNORMAL HIGH (ref 0–35)
AST: 39 U/L — ABNORMAL HIGH (ref 0–35)
Albumin: 3.9 g/dL (ref 3.5–5.2)
Alk Phos: 139 U/L — ABNORMAL HIGH (ref 35–105)
Anion Gap: 16 (ref 7–16)
Bilirubin,Total: 0.2 mg/dL (ref 0.0–1.2)
CO2: 27 mmol/L (ref 20–28)
Calcium: 8.6 mg/dL (ref 8.6–10.2)
Chloride: 93 mmol/L — ABNORMAL LOW (ref 96–108)
Creatinine: 1.01 mg/dL — ABNORMAL HIGH (ref 0.51–0.95)
GFR,Black: 65 *
GFR,Caucasian: 56 * — AB
Glucose: 97 mg/dL (ref 60–99)
Lab: 19 mg/dL (ref 6–20)
Potassium: 3.7 mmol/L (ref 3.3–5.1)
Sodium: 136 mmol/L (ref 133–145)
Total Protein: 6.2 g/dL — ABNORMAL LOW (ref 6.3–7.7)

## 2016-11-27 LAB — DIFF MANUAL
Bands %: 6 % (ref 0–10)
Diff Based On: 114 CELLS
Metamyelocyte %: 1 % (ref 0–1)
Myelocyte %: 2 % — ABNORMAL HIGH (ref 0–0)
Promyelocyte %: 2 % — ABNORMAL HIGH (ref 0–0)
React Lymph %: 1 % (ref 0–6)

## 2016-11-27 LAB — NEUTROPHIL #-INSTRUMENT: Neutrophil #-Instrument: 11 10*3/uL

## 2016-11-27 LAB — CA 19 9 (EFF. 01-2011): CA 19 9 (eff. 01-2011): 67 U/mL — ABNORMAL HIGH (ref 0–35)

## 2016-11-27 MED ORDER — DEXTROSE 5 % IV SOLN WRAPPED *I*
30.0000 mL/h | INTRAVENOUS | Status: DC | PRN
Start: 2016-11-27 — End: 2016-11-27
  Administered 2016-11-27: 30 mL/h via INTRAVENOUS

## 2016-11-27 MED ORDER — DEXAMETHASONE SODIUM PHOSPHATE 10 MG/ML IJ SOLN *I*
10.0000 mg | Freq: Once | INTRAMUSCULAR | Status: AC
Start: 2016-11-27 — End: 2016-11-27
  Administered 2016-11-27: 10 mg via INTRAVENOUS
  Filled 2016-11-27: qty 1

## 2016-11-27 MED ORDER — FLUOROURACIL (ADRUCIL) 1 GM/20ML IV SOLN *I*
320.0000 mg/m2 | Freq: Once | INTRAVENOUS | Status: AC
Start: 2016-11-27 — End: 2016-11-27
  Administered 2016-11-27: 576 mg via INTRAVENOUS
  Filled 2016-11-27: qty 11.52

## 2016-11-27 MED ORDER — SODIUM CHLORIDE 0.9 % IV SOLN WRAPPED *I*
150.0000 mg | Freq: Once | INTRAVENOUS | Status: AC
Start: 2016-11-27 — End: 2016-11-27
  Administered 2016-11-27: 150 mg via INTRAVENOUS
  Filled 2016-11-27: qty 5

## 2016-11-27 MED ORDER — OXALIPLATIN 100 MG/20ML IV SOLN *I*
65.0000 mg/m2 | Freq: Once | INTRAVENOUS | Status: AC
Start: 2016-11-27 — End: 2016-11-27
  Administered 2016-11-27: 117 mg via INTRAVENOUS
  Filled 2016-11-27: qty 23.4

## 2016-11-27 MED ORDER — HOME INFUSION START *I*
1.0000 | Freq: Once | Status: AC
Start: 2016-11-27 — End: 2016-11-27
  Administered 2016-11-27: 1 via INTRAVENOUS
  Filled 2016-11-27: qty 1

## 2016-11-27 MED ORDER — IRINOTECAN HCL 100 MG/5ML IV SOLN *I*
90.0000 mg/m2 | Freq: Once | INTRAVENOUS | Status: AC
Start: 2016-11-27 — End: 2016-11-27
  Administered 2016-11-27: 162 mg via INTRAVENOUS
  Filled 2016-11-27: qty 8.1

## 2016-11-27 MED ORDER — LEUCOVORIN CALCIUM 350 MG IJ SOLR (50 MG/ML) *I*
320.0000 mg/m2 | Freq: Once | INTRAVENOUS | Status: AC
Start: 2016-11-27 — End: 2016-11-27
  Administered 2016-11-27: 576 mg via INTRAVENOUS
  Filled 2016-11-27: qty 11.52

## 2016-11-27 MED ORDER — PALONOSETRON HCL 0.25 MG/5ML IV SOLN *I*
0.2500 mg | Freq: Once | INTRAVENOUS | Status: AC
Start: 2016-11-27 — End: 2016-11-27
  Administered 2016-11-27: 0.25 mg via INTRAVENOUS
  Filled 2016-11-27: qty 5

## 2016-11-27 NOTE — Progress Notes (Signed)
Amanda Galloway, 71 y.o. female is here for: FOLFIRINOX    Patient identification verified by 2 RNs: [x] YES [] NO  Drug/dose verified by 2 RNs: [x] YES [] NO  Current consent present in chart: [x] YES [] NO [] OTHER   Clinic handoff reviewed: [x] YES [] NO [] N/A   Labs reviewed and meet treatment parameters: [x] YES [] NO [] OTHER   Pre-medications given: [x] YES [] NO [] N/A  IV device: [] PIV [x] IVAD [] PICC [] TCVC  Blood return present before and after infusion: [x] YES [] NO     Patient tolerated FOLFIRINOX well without incident.    Patient and family educated regarding:   [x] YES [] NO drugs received    [x] YES [] NO toxities   [x] YES [] NO toxicity management  [x] YES [] NO follow up care    5FU home infusion initiated by [facility] RN. Patient discharged home in stable condition, aware of future appointments set.

## 2016-11-27 NOTE — Progress Notes (Signed)
[facility] SAME DAY TREATMENT HAND-OFF:   SITUATION:   Scheduled treatment category for today:     Cancer treatment:  Chemotherapy    Is this a new cancer treatment?: No      Consent obtained:  Yes    Location:  [facility]    Labs complete:  Pending    Current patient status:  Scheduled treatment    Additional relevant information:  Other    Comments on information needed:  Great scan today!    OK to treat for scheduled treatment:  Yes

## 2016-11-27 NOTE — Progress Notes (Signed)
Patient ID: Amanda Galloway 71 y.o. presents to the [facility] Hereditary Cancer Screening and Risk Reduction Clinic to review results from genetic testing.        RESULT:  Negative for any clinically significant alterations. No variants of uncertain significance.    Testing done with My[facility] MyRisk: APC, ATM, BARD1, BMPR1A, BRCA1, BRCA2, BRIP1, CDH1, CDKN2A, CHEK2, EPCAM, MLH1, MSH6, MSH2, MUTYH, NBN, PALB2, PMS2, PTEN, RAD51C, RAD51D, SMAD4, STK11, TP53, selected regions of POLE and POLD1, and large rearrangement analysis on selected regions of GREM1.      Personal History of Cancer:  Disease Site Year/Age    Pancreatic 2018  71yo         Family History of Cancer:  A three generation pedigree was obtained at this visit.  Maternal Ancestry: English  Paternal Ancestry: German  Ashkenazi Jewish ancestry: No  Consanguinity: No     Relationship to patient/Name Cancer Age at Diagnosis Alive? Or   Age of Death?   Mother  breast 63 65   Father NHL 72 72        Assessment:    Amanda Galloway 71 y.o. presents to the [facility] Hereditary Cancer Screening and Risk Reduction Clinic to review results from genetic testing    Results of Myriad MyRisk are Negative for any alterations. No detectable inherited cancer syndrome.       Plan:    No additional cancer screening indicated.    No additional genetic testing indicated for her family.   Continue follow up with oncology team for management of cancer.      Encouraged to call the office any time if problems or questions arise. She had the opportunity to ask questions.  All her questions were answered to her satisfaction. she is agreeable with this plan.    Morelia Cassells W. Lustig NP  Outpatient Medical Oncology  Hereditary Cancer Screening and Risk Reduction Program  [facility]

## 2016-11-27 NOTE — Progress Notes (Signed)
Pt arrived stable.  Port accessed and blood drawn per orders. Tegaderm placed, flushed with 20cc NS, alcohol cap placed.  Pt d/c'd to clinic, ambulatory with daughter.

## 2016-11-28 ENCOUNTER — Ambulatory Visit: Payer: PRIVATE HEALTH INSURANCE | Admitting: Oncology

## 2016-11-28 DIAGNOSIS — Z809 Family history of malignant neoplasm, unspecified: Secondary | ICD-10-CM

## 2016-11-28 NOTE — Progress Notes (Signed)
MEDICAL ONCOLOGY PROGRESS NOTE    Reason for Evaluation: Follow-up    Diagnosis: Pancreatic adenocarcinoma    Stage: Locally advanced    Performance Status: ECOG PS 1    Diagnosis and Treatment History:  1. Over several months, patient noted worsening abdominal pain and weight loss.  Extensive work-up including EGD, colonoscopy and imaging were unrevealing.  After suggestive HIDA scan, she underwent cholecystectomy on September 06, 2016 but symptoms persisted.      2. MRCP on September 25, 2016 demonstrated 2x2cm soft tissue mass-like abnormality in celiac region, contiguous with medial margin of uncinate process of pancreas concerning for pancreatic lession vs lymphadenopathy.    3. EUS/FNA on September 27, 2016 demonstrated a mass in pancreas uncinate process and lymph node pressing celiac axis. FNA confirmed adenocarcinoma.    4. She was transferred to [facility] for additional management.  Abd/pelvis CT on September 30, 2016 revealed ill-defined prominent retroperitoneal soft tissue with central low attenuation suggestive of necrosis which abuts SMA and is adjacent to pancreatic uncinate process similar to prior MRI. Differential includes pancreatic neoplasm versus lymphadenopathy.  CT angio on September 29, 2016 showed no pulmonary embolism.  Nonspecific 3 mm nodules in right upper and middle lobe, recommend attention on follow-up imaging.      5. Diagnostic laparoscopy and port placement by Dr. Ron Agee on October 04, 2016 - no evidence of metastatic disease.    Current Treatment: FOLFIRINOX chemotherapy - Cycle 1 (in-house) on October 05, 2016.  Cycle 2 on October 19, 2016.  Cycle 3 on November 02, 2016.  Cycle 4 on November 16, 2016.  Cycle 5 on November 27, 2016.    Interval History: Since our last visit, she has received another chemotherapy treatment.  She is accompanied today by her daughter.    Overall tolerating treatment very well.  Mild fatigue.  Appetite good.  Cold sensitivity but no neuropathy yet.    Pain controlled on current  regimen per Palliative Care.    Eager to know scan results.    Past Medical/Surgical History:  1. Fibromyalgia  2. Hypothyroidism  3. S/p knee surgery  4. S/p hysterectomy  5. S/p shoulder surgery  6. S/p tonsillectomy    Current Medications:  Current Outpatient Prescriptions on File Prior to Visit   Medication Sig Dispense Refill    escitalopram (LEXAPRO) 10 MG tablet Take 1 tablet (10 mg total) by mouth daily 30 tablet 2    amitriptyline (ELAVIL) 25 MG tablet Take 1 tablet (25 mg total) by mouth nightly 30 tablet 5    dexamethasone (DECADRON) 4 MG tablet Take 1 tablet (4 mg total) by mouth 2 times daily 60 tablet 5    docusate sodium (COLACE) 100 MG capsule Take 2 capsules (200 mg total) by mouth 2 times daily 120 capsule 5    metoclopramide (REGLAN) 5 MG tablet Take 1 tablet (5 mg total) by mouth 3 times daily (before meals) 90 tablet 5    pantoprazole (PROTONIX) 40 MG EC tablet Take 1 tablet (40 mg total) by mouth 2 times daily (before meals)   Swallow whole. Do not crush, break, or chew. 60 tablet 5    senna (SENOKOT) 8.6 MG tablet Take 2 tablets by mouth 2 times daily 120 tablet 5    methadone (DOLOPHINE) 5 MG tablet Take 2 tablets (10 mg total) by mouth 2 times daily   Max daily dose: 20 mg And 15 mg at hs; total MDD 35 mg 210 tablet 0  Meds Exist in Therapy Plan Peg-Filgrastim      pegfilgrastim (NEULASTA) 6 MG/0.6ML injection syringe Inject 0.6 mLs (6 mg total) into the skin once 0.6 mL 3    PREMARIN 0.625 MG tablet       ondansetron (ZOFRAN-ODT) 4 MG disintegrating tablet Take 1 tablet (4 mg total) by mouth 3 times daily as needed for Nausea   Place on top of tongue. 60 tablet 2    levothyroxine (SYNTHROID, LEVOTHROID) 137 MCG tablet Take 1 tablet (137 mcg total) by mouth daily (before breakfast) 30 tablet 0    Vitamins - senior (CENTRUM SILVER) TABS Take 1 tablet by mouth daily 30 tablet 0    triamterene-hydrochlorothiazide (MAXZIDE-25) 37.5-25 MG per tablet Take 1 tablet by mouth daily  30 tablet 0     No current facility-administered medications on file prior to visit.        Social History: Divorced.  Worked in [employer].  Daughter [name]) worked on [employer] and now in home care.    Family History: Mother with breast cancer.  Father with cancer of unknown type.    Review of Systems: As per Interval History, remainder of 12-point review of systems otherwise negative    Physical Exam:  Vitals:    11/27/16 0959   BP: 115/65   Pulse: 79   Resp: 18   Temp: 36.5 C (97.7 F)   Weight: 70.6 kg (155 lb 11.2 oz)   Height: 165.1 cm (5\' 5" )   Gen - NAD, well groomed  HEENT - EOMI, MMMs  Neck - Supple  Chest - CTAB  Heart - RRR, Nl S1/S2  Abdomen - Soft, nontender, nondistended, BS present  Skin - port site clean and dry  Extremities - No edema  Neurologic - Non-focal    Laboratory Review: CBC and CMP from November 27, 2016 reviewed - notable for decreasing CA 19-9    Pathology Review: As outlined above    Radiology Review:   1. Chest CT on November 23, 2016 revealed no new or growing pulmonary nodules.   Suspected small area of catheter-associated thrombus in right internal jugular vein.     2. Abd/pelvis CT on November 23, 2016 showed stable size of uncinate process pancreatic mass.  Significant improvement in ill-defined soft tissue attenuation material surrounding celiac and proximal SMA.  Decreased size of periceliac node.  No evidence of distant metastasis in abdomen or pelvis.    Assessment and Plan: 71 yo F with locally advanced pancreatic adenocarcinoma.  She is currently on 1st line FOLFIRINOX chemotherapy (with Neulasta support).      We reviewed with patient and her daughter that recent scan shows good response to treatment and they are pleased with this news.  Proceed with cycle 5 today.    Follow-up prior to cycle 6 for toxicity evaluation.  We will plan for 3-4 more cycles before repeat scans.    Patient knows to call back with any questions, concerns or new  symptoms.      Mohamedtaki A. Sharlyn Bologna, M.D.  Medical Oncology

## 2016-11-29 ENCOUNTER — Telehealth: Payer: Self-pay | Admitting: Hematology and Oncology

## 2016-11-29 NOTE — Progress Notes (Signed)
Patient ID: Amanda Galloway 71 y.o. presents to the [facility] Hereditary Cancer Screening and Risk Reduction Clinic to review results from genetic testing.        RESULT:  Negative for any clinically significant alterations. No variants of uncertain significance.    Testing done with My[facility] MyRisk: APC, ATM, BARD1, BMPR1A, BRCA1, BRCA2, BRIP1, CDH1, CDKN2A, CHEK2, EPCAM, MLH1, MSH6, MSH2, MUTYH, NBN, PALB2, PMS2, PTEN, RAD51C, RAD51D, SMAD4, STK11, TP53, selected regions of POLE and POLD1, and large rearrangement analysis on selected regions of GREM1.      Personal History of Cancer:  Disease Site Year/Age    Pancreatic 2018  71yo       Family History of Cancer:  A three generation pedigree was obtained at this visit.  Maternal Ancestry: English  Paternal Ancestry: [name]  Ashkenazi Jewish ancestry: No  Consanguinity: No    Relationship to patient/Name Cancer Age at Diagnosis Alive? Or   Age of Death?   Mother  breast 50 41   Father NHL 72 72       Assessment:    Amanda Galloway 71 y.o. presents to the [facility] and Risk Reduction Clinic to review results from genetic testing    Results of My[facility] are Negative for any alterations. No detectable inherited cancer syndrome.       Plan:    No additional cancer screening indicated.    No additional genetic testing indicated for her family.   Continue follow up with oncology team for management of cancer.      Encouraged to call the office any time if problems or questions arise. She had the opportunity to ask questions.  All her questions were answered to her satisfaction. she is agreeable with this plan.    Burns Spain NP  Outpatient Medical Oncology  Hereditary Cancer Screening and Risk Reduction Program  [facility]

## 2016-11-30 ENCOUNTER — Other Ambulatory Visit: Payer: Self-pay | Admitting: Hospice and Palliative Medicine

## 2016-11-30 ENCOUNTER — Telehealth: Payer: Self-pay | Admitting: Hospice and Palliative Medicine

## 2016-11-30 ENCOUNTER — Inpatient Hospital Stay: Payer: PRIVATE HEALTH INSURANCE

## 2016-11-30 ENCOUNTER — Ambulatory Visit: Payer: PRIVATE HEALTH INSURANCE | Admitting: Oncology

## 2016-11-30 ENCOUNTER — Ambulatory Visit: Payer: PRIVATE HEALTH INSURANCE

## 2016-11-30 MED ORDER — METHADONE HCL 5 MG PO TABS *I*
10.0000 mg | ORAL_TABLET | Freq: Two times a day (BID) | ORAL | 0 refills | Status: DC
Start: 2016-11-30 — End: 2017-01-01

## 2016-11-30 MED ORDER — LORAZEPAM 0.5 MG PO TABS *I*
0.5000 mg | ORAL_TABLET | Freq: Two times a day (BID) | ORAL | 0 refills | Status: DC | PRN
Start: 2016-11-30 — End: 2017-02-27

## 2016-11-30 NOTE — Telephone Encounter (Signed)
**Patient also needs Lorazepam 0.5 Mg  (Non on the medication List)      Days Left:  3 days  Last Appt:  10/30/2016  Next Appt:  01/29/2017    Pharm:[facility]  Confidential Drug Report  Search Terms: [name], 10-22-1945   Search Date: 11/30/2016 10:01:16 AM   Searching on behalf of: [id number] - [name]   The Drug Utilization Report below displays all of the controlled substance prescriptions, if any, that your patient has filled in the last twelve months. The information displayed on this report is compiled from pharmacy submissions to the Department, and accurately reflects the information as submitted by the pharmacies.  This report was requested by: Salome Arnt   Reference #: [id number]   [facility] Pres[facility]  Patient Name: Amanda Galloway Birth Date: 1945-11-20   Address: [place], [place] [address] Sex: Female   Rx Written Rx Dispensed Drug Quantity Days Supply Prescriber Name Payment Method Dispenser   10/30/2016 11/02/2016 methadone hcl 5 mg tablet  210 30 Quill, [place]. #06   10/30/2016 11/02/2016 lorazepam 0.5 mg tablet  60 30 Quill, [place] #06   Others' Prescriptions  Patient Name: Amanda Galloway Birth Date: 1945/10/25   Address: [place], [place] [address] Sex: Female   Rx Written Rx Dispensed Drug Quantity Days Supply Prescriber Name Payment Method Dispenser   10/23/2016 10/29/2016 lorazepam 0.5 mg tablet  7 7 Hellems, [place]. #06     Patient Name: Amanda Galloway Birth Date: 12-17-1945   Address: [address] [place], [place] [address] Sex: Female   Rx Written Rx Dispensed Drug Quantity Days Supply Prescriber Name Payment Method Dispenser   10/18/2016 10/20/2016 methadone hcl 10 mg tablet  24 12 Hellems, Guinevere A [facility]   10/18/2016 10/20/2016  methadone hcl 5 mg tablet  36 12 Hellems, Guinevere A [facility]   10/13/2016 10/14/2016 methadone hcl 10 mg tablet  14 7 Sharyn Dross, [place] [facility]   10/13/2016 10/14/2016 hydromorphone 4 mg tablet  84 7 Sharyn Dross, [place] [facility]   10/13/2016 10/14/2016 methadone hcl 5 mg tablet  21 7 Sharyn Dross, [place] [facility]   10/13/2016 10/14/2016 lorazepam 0.5 mg tablet  7 7 Sharyn Dross, [place] [facility] [place]     Patient Name: Amanda Galloway Birth Date: 01-18-1946   Address: [address] [place], [place] [address] Sex: Female   Rx Written Rx Dispensed Drug Quantity Days Supply Prescriber Name Payment Method Dispenser   09/29/2016 09/29/2016 hydromorphone 0.5 mg/0.5 ml  68ml 4 Tvetenstrand, Christian D MD No Payment [facility]     Patient Name: Amanda Galloway Birth Date: 1945/12/07   Address: [address] [place], [place] [address] Sex: Female   Rx Written Rx Dispensed Drug Quantity Days Supply Prescriber Name Payment Method Dispenser   09/17/2016 09/17/2016 oxycodone-acetaminophen 5-325 mg tablet  12 3 St[place], [place] #[id number]   09/14/2016 09/15/2016 hydrocodone-acetaminophen 5-325 mg tablet  20 5 Basil Dess D [place] [phone]   09/06/2016 09/07/2016 hydrocodone-acetaminophen 5-325 mg tablet  20 5 Tvetenstrand, [place] [phone]  09/04/2016 09/04/2016 hydrocodone-acetaminophen 5-325 mg tablet  15 4 Pennie Rushing [place] [phone]   08/31/2016 08/31/2016 tramadol hcl 50 mg tablet  60 20 [place], [place] [phone]   * - Drugs marked with an asterisk are compound drugs. If the compound drug is made up of more than one controlled substance, then each controlled substance will be a separate row in the table.      Report Suspicious Activity    Send Questions/Comments   Substance Abuse Treatment Information

## 2016-11-30 NOTE — Telephone Encounter (Signed)
Meds refilled.

## 2016-12-03 ENCOUNTER — Telehealth: Payer: Self-pay | Admitting: Hematology and Oncology

## 2016-12-03 NOTE — Telephone Encounter (Signed)
Spoke with daughter [name], she called to report blood on toilet paper Friday and past 2 days. [name] didn't see any blood in the stool, and notes that she believes that Faria had been straining to move her bowels. Jaeanna now moving bowels, denies fevers, appetite and energy low on Friday and over the weekend. Let [name] know that Daneen should monitor bowels for now, and call back with  any increased blood in stool or toilet. Annell's appetite and energy should start to improve over the next 2 days. [name] knows to call us with any obstructive symptoms, and verbalized understanding.

## 2016-12-12 NOTE — Patient Instructions (Addendum)
October 2018   Sunday Monday Tuesday Wednesday Thursday Friday Saturday        1     2     3     4     5     6       7     8     9     10     11     12    Port Draw @ 8:00AM    PA/MD visit @ 9:00AM    FOLFIRINOX @ 10:00AM   13       14     15     16     17     18     19     20       21     22     NEW PATIENT VISIT    2:00 PM   (60 min.)   CAN CTR, GENETICS AND RISK REDUCTION   [facility] Hereditary Risk Screen 23     24     25     26    Port Draw @ 9:00AM    PA/MD visit @ 10:00AM    FOLFIRINOX @ 11:00AM   27       28     29     30     31                               November 2018   Sunday Monday Tuesday Wednesday Thursday Friday Saturday                       1     2     3       4     5     6     7     8     9    Port Draw @ 7:30AM    PA/MD visit @ 8:30AM    FOLFIRINOX @ 9:30AM     10       11     12     13     14     15     16  CT Scan@1130a  2hr fast prior 17       18     19 20  Port Draw@930  Amanda Galloway@10am 21     22     23    Folfirinox@9am   24       25     26     27     FOLLOW UP VISIT   10:00 AM   (30 min.)   Galloway, Timothy, MD   [facility] 28     29     30                    Your Oncology team    [phone]    Amanda Brunswick MD  [facility] PA  Elicia Lamp RN/Sue Vickki Muff RN  Melanie Conner/Susi [facility] Secret[employer]    FOLFIRINOX   Folfirinox  is the name for a combination of intravenous (IV) chemotherapies.  It includes Fluorouracil (5FU), Leucovorin, Irinotecan and Oxaliplatin   Given every 2 weeks.     Plan to be here for 5 1/2 hours for each treatment.  You will get some chemotherapy at our infusion center.  Then you will go home with a pump with fluorouracil (5FU that will infuse over 46 hours (almost 2 days).  You can not get pump wet so you cannot shower.  You may wash your hair in the sink and take sponge bath.   You will need blood work done before each treatment.   You will need a medi-port placed under your skin on your chest.  This is where you  will be hooked up to an IV to received your chemotherapy.   You will also get a shot called Neulasta, on the day after the pump is taken down.  This medicine helps your body make white blood cells.    You may experience these side effects:   Fatigue   Nausea/vomiting  (Use the anti-nausea medications.  We do not want you to vomit at all.  If you have vomiting or nausea that is not well controlled, call us!)   Decreased blood counts   Red blood cells - if low, you may feel fatigue   White blood cells - if low, you may get an infection   Platelets - if low, it may take longer to stop bleeding   Hand and foot syndrome - be sure to moisture well, we recommend using a thick cream like Eucerin or bag balm   Numbness/tingling in your hands/feet (Neuropathy)   Cold sensitivity   Hair loss/ hair thinning   Allergic reaction   Taste changes   Constipation: call if no bowel movement for over 2 days   Diarrhea: call if more than 4-5 loose, watery stools in a day - SEE HANDOUT!     Mouth sores   Be careful of being out in the sun!  Use SPF of 30 or greater.  (See hand outs for more detailed list.)    Anti-Nausea Medications: Medications for home use.  The day of treatment the [facility] will give you your anti-nausea medications.    Ondansetron (Zofran) 8 mg tablet by mouth, three times a day, if needed   May cause contipation    Prochlorperazine (Compazine) 10 mg tablet by mouth, every 6 hours as needed for nausea    Do not wait until you are very nauseated to start the extra pills.  The minute you feel a little 'off', start taking the medication.    If these pills are not working on your nausea, please call [phone] so we can find something that works for you!  What to expect during your infusion center appointment:    1. We ask that you limit the number of visitors to two at one time. The treatment area gets crowded quickly and limiting the number of people allows the nursing team to better care for  you. It also promotes a more relaxing environment when there is less noise.  2. Children under the age of 11 are not permitted in the [place]. This is for the safety of the child and patients alike.   3. We offer light snacks and drinks in the [facility]. Please feel free to bring food from home, however, we ask that you avoid food or meals with strong odors. Alcoholic beverages are not permitted in the [facility].  4. There is a television for each treatment chair. Headphones are available for use or you may bring a set with you.  5. Please inform your nurse if you have questions for your treatment team, they  would be happy to get in touch with them while you are here.

## 2016-12-14 ENCOUNTER — Telehealth: Payer: Self-pay | Admitting: Hematology and Oncology

## 2016-12-14 ENCOUNTER — Encounter: Payer: Self-pay | Admitting: Oncology

## 2016-12-14 ENCOUNTER — Ambulatory Visit: Payer: PRIVATE HEALTH INSURANCE | Attending: Hematology and Oncology | Admitting: Oncology

## 2016-12-14 ENCOUNTER — Ambulatory Visit: Payer: PRIVATE HEALTH INSURANCE

## 2016-12-14 VITALS — BP 123/58 | HR 81 | Temp 98.2°F | Resp 18 | Ht 65.0 in | Wt 161.5 lb

## 2016-12-14 DIAGNOSIS — E039 Hypothyroidism, unspecified: Secondary | ICD-10-CM

## 2016-12-14 DIAGNOSIS — C259 Malignant neoplasm of pancreas, unspecified: Secondary | ICD-10-CM

## 2016-12-14 DIAGNOSIS — Z5111 Encounter for antineoplastic chemotherapy: Secondary | ICD-10-CM | POA: Insufficient documentation

## 2016-12-14 LAB — CBC AND DIFFERENTIAL
Baso # K/uL: 0.3 10*3/uL — ABNORMAL HIGH (ref 0.0–0.1)
Basophil %: 0.9 %
Eos # K/uL: 0.5 10*3/uL — ABNORMAL HIGH (ref 0.0–0.4)
Eosinophil %: 1.7 %
Hematocrit: 33 % — ABNORMAL LOW (ref 34–45)
Hemoglobin: 10.5 g/dL — ABNORMAL LOW (ref 11.2–15.7)
Lymph # K/uL: 3.4 10*3/uL (ref 1.2–3.7)
Lymphocyte %: 10.3 %
MCH: 33 pg/cell — ABNORMAL HIGH (ref 26–32)
MCHC: 32 g/dL (ref 32–36)
MCV: 102 fL — ABNORMAL HIGH (ref 79–95)
Mono # K/uL: 0.8 10*3/uL (ref 0.2–0.9)
Monocyte %: 2.6 %
Neut # K/uL: 25.7 10*3/uL — ABNORMAL HIGH (ref 1.6–6.1)
Nucl RBC # K/uL: 0.2 10*3/uL — ABNORMAL HIGH (ref 0.0–0.0)
Nucl RBC %: 0.6 /100 WBC — ABNORMAL HIGH (ref 0.0–0.2)
Platelets: 267 10*3/uL (ref 160–370)
RBC: 3.2 MIL/uL — ABNORMAL LOW (ref 3.9–5.2)
RDW: 19.3 % — ABNORMAL HIGH (ref 11.7–14.4)
Seg Neut %: 82.7 %
WBC: 30.9 10*3/uL — ABNORMAL HIGH (ref 4.0–10.0)

## 2016-12-14 LAB — DIFF MANUAL
Diff Based On: 116 CELLS
Myelocyte %: 1 % — ABNORMAL HIGH (ref 0–0)
React Lymph %: 1 % (ref 0–6)

## 2016-12-14 LAB — NEUTROPHIL #-INSTRUMENT: Neutrophil #-Instrument: 23.6 10*3/uL

## 2016-12-14 LAB — COMPREHENSIVE METABOLIC PANEL
ALT: 34 U/L (ref 0–35)
AST: 28 U/L (ref 0–35)
Albumin: 4.1 g/dL (ref 3.5–5.2)
Alk Phos: 139 U/L — ABNORMAL HIGH (ref 35–105)
Anion Gap: 12 (ref 7–16)
Bilirubin,Total: 0.2 mg/dL (ref 0.0–1.2)
CO2: 31 mmol/L — ABNORMAL HIGH (ref 20–28)
Calcium: 9.2 mg/dL (ref 8.6–10.2)
Chloride: 94 mmol/L — ABNORMAL LOW (ref 96–108)
Creatinine: 0.85 mg/dL (ref 0.51–0.95)
GFR,Black: 80 *
GFR,Caucasian: 69 *
Glucose: 78 mg/dL (ref 60–99)
Lab: 30 mg/dL — ABNORMAL HIGH (ref 6–20)
Potassium: 4.2 mmol/L (ref 3.3–5.1)
Sodium: 137 mmol/L (ref 133–145)
Total Protein: 6.2 g/dL — ABNORMAL LOW (ref 6.3–7.7)

## 2016-12-14 LAB — TSH: TSH: 2.87 u[IU]/mL (ref 0.27–4.20)

## 2016-12-14 LAB — RBC MORPHOLOGY

## 2016-12-14 LAB — CA 19 9 (EFF. 01-2011): CA 19 9 (eff. 01-2011): 54 U/mL — ABNORMAL HIGH (ref 0–35)

## 2016-12-14 MED ORDER — FLUOROURACIL (ADRUCIL) 1 GM/20ML IV SOLN *I*
320.0000 mg/m2 | Freq: Once | INTRAVENOUS | Status: AC
Start: 2016-12-14 — End: 2016-12-14
  Administered 2016-12-14: 576 mg via INTRAVENOUS
  Filled 2016-12-14: qty 11.52

## 2016-12-14 MED ORDER — DEXAMETHASONE SODIUM PHOSPHATE 10 MG/ML IJ SOLN *I*
10.0000 mg | Freq: Once | INTRAMUSCULAR | Status: AC
Start: 2016-12-14 — End: 2016-12-14
  Administered 2016-12-14: 10 mg via INTRAVENOUS
  Filled 2016-12-14: qty 1

## 2016-12-14 MED ORDER — LEUCOVORIN CALCIUM 350 MG IJ SOLR (50 MG/ML) *I*
320.0000 mg/m2 | Freq: Once | INTRAVENOUS | Status: AC
Start: 2016-12-14 — End: 2016-12-14
  Administered 2016-12-14: 576 mg via INTRAVENOUS
  Filled 2016-12-14: qty 11.52

## 2016-12-14 MED ORDER — SODIUM CHLORIDE 0.9 % IV SOLN WRAPPED *I*
150.0000 mg | Freq: Once | INTRAVENOUS | Status: AC
Start: 2016-12-14 — End: 2016-12-14
  Administered 2016-12-14: 150 mg via INTRAVENOUS
  Filled 2016-12-14: qty 5

## 2016-12-14 MED ORDER — OXALIPLATIN 100 MG/20ML IV SOLN *I*
65.0000 mg/m2 | Freq: Once | INTRAVENOUS | Status: AC
Start: 2016-12-14 — End: 2016-12-14
  Administered 2016-12-14: 117 mg via INTRAVENOUS
  Filled 2016-12-14: qty 20

## 2016-12-14 MED ORDER — IRINOTECAN HCL 100 MG/5ML IV SOLN *I*
90.0000 mg/m2 | Freq: Once | INTRAVENOUS | Status: AC
Start: 2016-12-14 — End: 2016-12-14
  Administered 2016-12-14: 162 mg via INTRAVENOUS
  Filled 2016-12-14: qty 8.1

## 2016-12-14 MED ORDER — DEXTROSE 5 % IV SOLN WRAPPED *I*
30.0000 mL/h | INTRAVENOUS | Status: DC | PRN
Start: 2016-12-14 — End: 2016-12-14
  Administered 2016-12-14: 30 mL/h via INTRAVENOUS

## 2016-12-14 MED ORDER — HOME INFUSION START *I*
1.0000 | Freq: Once | Status: AC
Start: 2016-12-14 — End: 2016-12-14
  Administered 2016-12-14: 1 via INTRAVENOUS
  Filled 2016-12-14: qty 1

## 2016-12-14 MED ORDER — PALONOSETRON HCL 0.25 MG/5ML IV SOLN *I*
0.2500 mg | Freq: Once | INTRAVENOUS | Status: AC
Start: 2016-12-14 — End: 2016-12-14
  Administered 2016-12-14: 0.25 mg via INTRAVENOUS
  Filled 2016-12-14: qty 5

## 2016-12-14 NOTE — Progress Notes (Signed)
Pt tolerated  FOLFIRINOX well, no complaints.  Consent in chart, labs meet parameters for treatment.  Blood return obtained from IVAD pre/post infusions.  5FU home hook up done by Eug[facility]ie Norrie from [facility].  AVS declined, future appointments set.

## 2016-12-14 NOTE — Progress Notes (Signed)
[facility] SAME DAY TREATMENT HAND-OFF:   SITUATION:   Scheduled treatment category for today:     Cancer treatment:  Chemotherapy    Is this a new cancer treatment?: No      Consent obtained:  Yes    Location:  [facility]    Labs complete:  Pending    Current patient status:  Scheduled treatment    OK to treat for scheduled treatment:  Pending    Pending comments:  Pending CMP

## 2016-12-14 NOTE — Progress Notes (Signed)
IVAD accessed and labs drawn per orders. Remains accessed for anticipated treatment. Discharged to next apt in stable condition.

## 2016-12-14 NOTE — Progress Notes (Signed)
MEDICAL ONCOLOGY PROGRESS NOTE    Reason for Evaluation: Follow-up    Diagnosis: Pancreatic adenocarcinoma    Stage: Locally advanced    Performance Status: ECOG PS 1-2    Diagnosis and Treatment History:  1. Over several months, patient noted worsening abdominal pain and weight loss.  Extensive work-up including EGD, colonoscopy and imaging were unrevealing.  After suggestive HIDA scan, she underwent cholecystectomy on September 06, 2016 but symptoms persisted.      2. MRCP on September 25, 2016 demonstrated 2x2cm soft tissue mass-like abnormality in celiac region, contiguous with medial margin of uncinate process of pancreas concerning for pancreatic lession vs lymphadenopathy.    3. EUS/FNA on September 27, 2016 demonstrated a mass in pancreas uncinate process and lymph node pressing celiac axis. FNA confirmed adenocarcinoma.    4. She was transferred to [facility] for additional management.  Abd/pelvis CT on September 30, 2016 revealed ill-defined prominent retroperitoneal soft tissue with central low attenuation suggestive of necrosis which abuts SMA and is adjacent to pancreatic uncinate process similar to prior MRI. Differential includes pancreatic neoplasm versus lymphadenopathy.  CT angio on September 29, 2016 showed no pulmonary embolism.  Nonspecific 3 mm nodules in right upper and middle lobe, recommend attention on follow-up imaging.      5. Diagnostic laparoscopy and port placement by Dr. Ron Agee on October 04, 2016 - no evidence of metastatic disease.    Current Treatment: FOLFIRINOX chemotherapy - Cycle 1 (in-house) on October 05, 2016.  Cycle 2 on October 19, 2016.  Cycle 3 on November 02, 2016.  Cycle 4 on November 16, 2016.  Cycle 5 on November 27, 2016. Cycle 6 on December 14, 2016.    Interval History: Since our last visit, she has received another chemotherapy treatment.   Chief complaint: fatigue     Was wiped out after chemo, fell the night of chemo.  Was more fatigued than normal.  Spent much of her time on the couch.  Was  still able get around and do her ADLs. On decadron, but it didn't seem to help for the first 10 days when the fatigue was severe.   Weight up 6 pounds. Doesn't have an appetite, but eats because she has to   Has been feeling better in the past 4 days.  This past weekend she got out of the house   No pain, but had been eating 3 bananas a day, which was causing abdominal discomfort.  Needed dilaudid for the discomfort.  Better since she hasn't been eating as many bananas   Moving bowels, no diarrhea. No further blood, no straining. Colace and senna   Cold sensitivity lasted 10 days   Neuropathy in toes, under toes   No HFS   No mucositis   Not much nausea, doesn't need antiemetics      Past Medical/Surgical History:  1. Fibromyalgia  2. Hypothyroidism  3. S/p knee surgery  4. S/p hysterectomy  5. S/p shoulder surgery  6. S/p tonsillectomy    Current Medications:  Current Outpatient Prescriptions on File Prior to Visit   Medication Sig Dispense Refill    methadone (DOLOPHINE) 5 MG tablet Take 2 tablets (10 mg total) by mouth 2 times daily   Max daily dose: 20 mg And 15 mg at hs; total MDD 35 mg 210 tablet 0    LORazepam (ATIVAN) 0.5 MG tablet Take 1 tablet (0.5 mg total) by mouth 2 times daily as needed for Anxiety   Max daily dose: 1 mg  60 tablet 0    fluorouracil (ADRUCIL) 50 MG/ML injection Administer 3,382 mg into the vein every 14 days   Start in infusion center.  Infuse over 46 hours at home. 3382 mg 2    escitalopram (LEXAPRO) 10 MG tablet Take 1 tablet (10 mg total) by mouth daily 30 tablet 2    amitriptyline (ELAVIL) 25 MG tablet Take 1 tablet (25 mg total) by mouth nightly 30 tablet 5    dexamethasone (DECADRON) 4 MG tablet Take 1 tablet (4 mg total) by mouth 2 times daily 60 tablet 5    docusate sodium (COLACE) 100 MG capsule Take 2 capsules (200 mg total) by mouth 2 times daily 120 capsule 5    metoclopramide (REGLAN) 5 MG tablet Take 1 tablet (5 mg total) by mouth 3 times daily (before  meals) 90 tablet 5    pantoprazole (PROTONIX) 40 MG EC tablet Take 1 tablet (40 mg total) by mouth 2 times daily (before meals)   Swallow whole. Do not crush, break, or chew. 60 tablet 5    senna (SENOKOT) 8.6 MG tablet Take 2 tablets by mouth 2 times daily 120 tablet 5    Meds Exist in Therapy Plan Peg-Filgrastim      pegfilgrastim (NEULASTA) 6 MG/0.6ML injection syringe Inject 0.6 mLs (6 mg total) into the skin once 0.6 mL 3    fluorouracil (ADRUCIL) 50 MG/ML injection Administer 3,401 mg into the vein every 14 days   Start in infusion center.  Infuse over 46 hours at home. 3401 mg 2    heparin 100 UNIT/ML injection 5 mLs (500 Units total) by Intracatheter route as needed (for line care) 30 Syringe 5    sodium chloride 0.9 % flush 10 mLs by Intracatheter route as needed (for line care) 600 mL 5    PREMARIN 0.625 MG tablet       ondansetron (ZOFRAN-ODT) 4 MG disintegrating tablet Take 1 tablet (4 mg total) by mouth 3 times daily as needed for Nausea   Place on top of tongue. 60 tablet 2    levothyroxine (SYNTHROID, LEVOTHROID) 137 MCG tablet Take 1 tablet (137 mcg total) by mouth daily (before breakfast) 30 tablet 0    Vitamins - senior (CENTRUM SILVER) TABS Take 1 tablet by mouth daily 30 tablet 0    triamterene-hydrochlorothiazide (MAXZIDE-25) 37.5-25 MG per tablet Take 1 tablet by mouth daily 30 tablet 0     No current facility-administered medications on file prior to visit.        Social History: Divorced.  Worked in [employer].  Daughter [name]) worked on [employer] and now in home care.    Family History: Mother with breast cancer.  Father with cancer of unknown type.    Review of Systems: As per Interval History, remainder of 12-point review of systems otherwise negative    Physical Exam:  Vitals:    12/14/16 0830   BP: 123/58   Pulse: 81   Resp: 18   Temp: 36.8 C (98.2 F)   Weight: 73.3 kg (161 lb 8 oz)   Height: 165.1 cm (5\' 5" )   Gen - NAD  HEENT - EOMI, MMMs  Neck -  Supple  Chest - CTAB  Heart - RRR, Nl S1/S2  Abdomen - Soft, nontender, nondistended, BS present  Skin - port site clean and dry  Extremities - No edema  Neurologic - Non-focal      Laboratory Review: CBC and CMP from December 14, 2016 reviewed  Pathology Review: As outlined above    Radiology Review:   1. Chest CT on November 23, 2016 revealed no new or growing pulmonary nodules.   Suspected small area of catheter-associated thrombus in right internal jugular vein.     2. Abd/pelvis CT on November 23, 2016 showed stable size of uncinate process pancreatic mass.  Significant improvement in ill-defined soft tissue attenuation material surrounding celiac and proximal SMA.  Decreased size of periceliac node.  No evidence of distant metastasis in abdomen or pelvis.    Assessment and Plan: 71 yo F with locally advanced pancreatic adenocarcinoma.  She is currently on 1st line FOLFIRINOX chemotherapy (with Neulasta support).      Proceed with Cycle 6 today.    Fatigue - improved after first week of treatment. Discussed that fatigue related to cancer is multifactorial: cancer, chemotherapy, pain, stress, depression/anxiety, and insomnia all can contribute to cancer related fatigue. Discussed strategies to combat chemotherapy related fatigue such as prioritizing activities, planned rest periods, and regular exercise    She will return in 2 weeks for her next cycle.  We will plan for scans after cycle 8.    Patient knows to call back with any questions, concerns or new symptoms.    Martyn Malay, PA-C  Medical Oncology Physician Assistant

## 2016-12-14 NOTE — Telephone Encounter (Signed)
Noted open to home care.

## 2016-12-21 NOTE — Patient Instructions (Signed)
October 2018   Sunday Monday Tuesday Wednesday Thursday Friday Saturday        1     2     3     4     5     6       7     8     9     10     11     12    Port Draw @ 8:00AM    Galloway/Amanda Galloway visit @ 9:00AM    FOLFIRINOX @ 10:00AM   13       14     15     16     17     18     19     20       21     22     NEW PATIENT VISIT    2:00 PM   (60 min.)   CAN CTR, GENETICS AND RISK REDUCTION   [facility] Hereditary Risk Screen 23     24     25     26    Port Draw @ 9:00AM    Galloway/Amanda Galloway visit @ 10:00AM    FOLFIRINOX @ 11:00AM   27       28     29     30     31                               November 2018   Sunday Monday Tuesday Wednesday Thursday Friday Saturday                       1     2     3       4     5     6     7     8     9    Port Draw @ 7:30AM    Galloway/Amanda Galloway visit @ 8:30AM    FOLFIRINOX @ 9:30AM     10       11     12     13     14     15     16  CT Scan@1130a  2hr fast prior 17       18     19 20  Port Draw@930  Amanda Galloway@10am 21     22     23    Folfirinox@9am   24       25     26     27     FOLLOW UP VISIT   10:00 AM   (30 min.)   Galloway, Timothy, Amanda Galloway   [facility] 28     29     30                    Your Oncology team    [phone]    Amanda Galloway  Amanda Galloway  Amanda Galloway  Amanda Galloway    FOLFIRINOX   Folfirinox  is the name for a combination of intravenous (IV) chemotherapies.  It includes Fluorouracil (5FU), Leucovorin, Irinotecan and Oxaliplatin   Given every 2 weeks.     Plan to be here for 5 1/2 hours for each treatment.  You will get some chemotherapy at our infusion center.  Then you will go home with a pump with fluorouracil (5FU that will infuse over 46 hours (almost 2 days).  You can not get pump wet so you cannot shower.  You may wash your hair in the sink and take sponge bath.   You will need blood work done before each treatment.   You will need a medi-port placed under your skin on your chest.  This is where you  will be hooked up to an IV to received your chemotherapy.   You will also get a shot called Neulasta, on the day after the pump is taken down.  This medicine helps your body make white blood cells.    You may experience these side effects:   Fatigue   Nausea/vomiting  (Use the anti-nausea medications.  We do not want you to vomit at all.  If you have vomiting or nausea that is not well controlled, call us!)   Decreased blood counts   Red blood cells - if low, you may feel fatigue   White blood cells - if low, you may get an infection   Platelets - if low, it may take longer to stop bleeding   Hand and foot syndrome - be sure to moisture well, we recommend using a thick cream like Eucerin or bag balm   Numbness/tingling in your hands/feet (Neuropathy)   Cold sensitivity   Hair loss/ hair thinning   Allergic reaction   Taste changes   Constipation: call if no bowel movement for over 2 days   Diarrhea: call if more than 4-5 loose, watery stools in a day - SEE HANDOUT!     Mouth sores   Be careful of being out in the sun!  Use SPF of 30 or greater.  (See hand outs for more detailed list.)    Anti-Nausea Medications: Medications for home use.  The day of treatment the [facility] will give you your anti-nausea medications.    Ondansetron (Zofran) 8 mg tablet by mouth, three times a day, if needed   May cause contipation    Prochlorperazine (Compazine) 10 mg tablet by mouth, every 6 hours as needed for nausea    Do not wait until you are very nauseated to start the extra pills.  The minute you feel a little 'off', start taking the medication.    If these pills are not working on your nausea, please call [phone] so we can find something that works for you!  What to expect during your infusion center appointment:    1. We ask that you limit the number of visitors to two at one time. The treatment area gets crowded quickly and limiting the number of people allows the nursing team to better care for  you. It also promotes a more relaxing environment when there is less noise.  2. Children under the age of 39 are not permitted in the [place]. This is for the safety of the child and patients alike.   3. We offer light snacks and drinks in the [place]. Please feel free to bring food from home, however, we ask that you avoid food or meals with strong odors. Alcoholic beverages are not permitted in the [place].  4. There is a television for each treatment chair. Headphones are available for use or you may bring a set with you.  5. Please inform your nurse if you have questions for your treatment team, they  would be happy to get in touch with them while you are here.

## 2016-12-24 ENCOUNTER — Ambulatory Visit: Payer: PRIVATE HEALTH INSURANCE

## 2016-12-28 ENCOUNTER — Encounter: Payer: Self-pay | Admitting: Oncology

## 2016-12-28 ENCOUNTER — Ambulatory Visit: Payer: PRIVATE HEALTH INSURANCE | Admitting: Adult Health

## 2016-12-28 ENCOUNTER — Ambulatory Visit: Payer: PRIVATE HEALTH INSURANCE

## 2016-12-28 ENCOUNTER — Ambulatory Visit: Payer: PRIVATE HEALTH INSURANCE | Admitting: Oncology

## 2016-12-28 DIAGNOSIS — C259 Malignant neoplasm of pancreas, unspecified: Secondary | ICD-10-CM

## 2016-12-28 LAB — DIFF MANUAL
Bands %: 7 % (ref 0–10)
Diff Based On: 115 CELLS
Metamyelocyte %: 4 % — ABNORMAL HIGH (ref 0–1)
Myelocyte %: 1 % — ABNORMAL HIGH (ref 0–0)

## 2016-12-28 LAB — CBC AND DIFFERENTIAL
Baso # K/uL: 0 10*3/uL (ref 0.0–0.1)
Basophil %: 0 %
Eos # K/uL: 0 10*3/uL (ref 0.0–0.4)
Eosinophil %: 0 %
Hematocrit: 33 % — ABNORMAL LOW (ref 34–45)
Hemoglobin: 10.5 g/dL — ABNORMAL LOW (ref 11.2–15.7)
Lymph # K/uL: 2.8 10*3/uL (ref 1.2–3.7)
Lymphocyte %: 13 %
MCH: 33 pg/cell — ABNORMAL HIGH (ref 26–32)
MCHC: 32 g/dL (ref 32–36)
MCV: 104 fL — ABNORMAL HIGH (ref 79–95)
Mono # K/uL: 0.4 10*3/uL (ref 0.2–0.9)
Monocyte %: 1.7 %
Neut # K/uL: 17.2 10*3/uL — ABNORMAL HIGH (ref 1.6–6.1)
Nucl RBC # K/uL: 0.1 10*3/uL — ABNORMAL HIGH (ref 0.0–0.0)
Nucl RBC %: 0.5 /100 WBC — ABNORMAL HIGH (ref 0.0–0.2)
Platelets: 176 10*3/uL (ref 160–370)
RBC: 3.2 MIL/uL — ABNORMAL LOW (ref 3.9–5.2)
RDW: 20.1 % — ABNORMAL HIGH (ref 11.7–14.4)
Seg Neut %: 73.9 %
WBC: 21.2 10*3/uL — ABNORMAL HIGH (ref 4.0–10.0)

## 2016-12-28 LAB — COMPREHENSIVE METABOLIC PANEL
ALT: 29 U/L (ref 0–35)
AST: 25 U/L (ref 0–35)
Albumin: 4.2 g/dL (ref 3.5–5.2)
Alk Phos: 150 U/L — ABNORMAL HIGH (ref 35–105)
Anion Gap: 13 (ref 7–16)
Bilirubin,Total: <0.2 mg/dL (ref 0.0–1.2)
CO2: 30 mmol/L — ABNORMAL HIGH (ref 20–28)
Calcium: 9 mg/dL (ref 8.6–10.2)
Chloride: 96 mmol/L (ref 96–108)
Creatinine: 0.94 mg/dL (ref 0.51–0.95)
GFR,Black: 71 *
GFR,Caucasian: 61 *
Glucose: 92 mg/dL (ref 60–99)
Lab: 22 mg/dL — ABNORMAL HIGH (ref 6–20)
Potassium: 3.7 mmol/L (ref 3.3–5.1)
Sodium: 139 mmol/L (ref 133–145)
Total Protein: 6.5 g/dL (ref 6.3–7.7)

## 2016-12-28 LAB — RBC MORPHOLOGY

## 2016-12-28 LAB — NEUTROPHIL #-INSTRUMENT: Neutrophil #-Instrument: 14.6 10*3/uL

## 2016-12-28 LAB — CA 19 9 (EFF. 01-2011): CA 19 9 (eff. 01-2011): 48 U/mL — ABNORMAL HIGH (ref 0–35)

## 2016-12-28 MED ORDER — SODIUM CHLORIDE 0.9 % IV SOLN WRAPPED *I*
150.0000 mg | Freq: Once | INTRAVENOUS | Status: AC
Start: 2016-12-28 — End: 2016-12-28
  Administered 2016-12-28: 150 mg via INTRAVENOUS
  Filled 2016-12-28: qty 5

## 2016-12-28 MED ORDER — DEXAMETHASONE SODIUM PHOSPHATE 10 MG/ML IJ SOLN *I*
10.0000 mg | Freq: Once | INTRAMUSCULAR | Status: AC
Start: 2016-12-28 — End: 2016-12-28
  Administered 2016-12-28: 10 mg via INTRAVENOUS
  Filled 2016-12-28: qty 1

## 2016-12-28 MED ORDER — ESCITALOPRAM OXALATE 20 MG PO TABS *I*
20.0000 mg | ORAL_TABLET | Freq: Every day | ORAL | 3 refills | Status: DC
Start: 2016-12-28 — End: 2017-08-20

## 2016-12-28 MED ORDER — FAMOTIDINE 20 MG PO TABS *I*
20.0000 mg | ORAL_TABLET | Freq: Two times a day (BID) | ORAL | 3 refills | Status: DC
Start: 2016-12-28 — End: 2017-08-20

## 2016-12-28 MED ORDER — PEGFILGRASTIM (NEULASTA) 6 MG/0.6ML SC SOSY *I*
6.0000 mg | PREFILLED_SYRINGE | Freq: Once | SUBCUTANEOUS | 3 refills | Status: DC
Start: 2016-12-28 — End: 2017-08-20

## 2016-12-28 MED ORDER — HOME INFUSION START *I*
1.0000 | Freq: Once | Status: AC
Start: 2016-12-28 — End: 2016-12-28
  Administered 2016-12-28: 1 via INTRAVENOUS
  Filled 2016-12-28: qty 1

## 2016-12-28 MED ORDER — LEUCOVORIN CALCIUM 350 MG IJ SOLR (50 MG/ML) *I*
320.0000 mg/m2 | Freq: Once | INTRAVENOUS | Status: AC
Start: 2016-12-28 — End: 2016-12-28
  Administered 2016-12-28: 586 mg via INTRAVENOUS
  Filled 2016-12-28: qty 11.72

## 2016-12-28 MED ORDER — DEXTROSE 5 % IV SOLN WRAPPED *I*
30.0000 mL/h | INTRAVENOUS | Status: DC | PRN
Start: 2016-12-28 — End: 2016-12-28
  Administered 2016-12-28: 30 mL/h via INTRAVENOUS

## 2016-12-28 MED ORDER — OXALIPLATIN 100 MG/20ML IV SOLN *I*
65.0000 mg/m2 | Freq: Once | INTRAVENOUS | Status: AC
Start: 2016-12-28 — End: 2016-12-28
  Administered 2016-12-28: 119 mg via INTRAVENOUS
  Filled 2016-12-28: qty 20

## 2016-12-28 MED ORDER — FLUOROURACIL (ADRUCIL) 1 GM/20ML IV SOLN *I*
320.0000 mg/m2 | Freq: Once | INTRAVENOUS | Status: AC
Start: 2016-12-28 — End: 2016-12-28
  Administered 2016-12-28: 586 mg via INTRAVENOUS
  Filled 2016-12-28: qty 11.72

## 2016-12-28 MED ORDER — IRINOTECAN HCL 100 MG/5ML IV SOLN *I*
90.0000 mg/m2 | Freq: Once | INTRAVENOUS | Status: AC
Start: 2016-12-28 — End: 2016-12-28
  Administered 2016-12-28: 165 mg via INTRAVENOUS
  Filled 2016-12-28: qty 8.25

## 2016-12-28 MED ORDER — PALONOSETRON HCL 0.25 MG/5ML IV SOLN *I*
0.2500 mg | Freq: Once | INTRAVENOUS | Status: AC
Start: 2016-12-28 — End: 2016-12-28
  Administered 2016-12-28: 0.25 mg via INTRAVENOUS
  Filled 2016-12-28: qty 5

## 2016-12-28 NOTE — Progress Notes (Signed)
Patient came in for port draw today. Good blood return noted from IVAD. IVAD left accessed for treatment today.

## 2016-12-28 NOTE — Progress Notes (Signed)
Amanda Galloway, 71 y.o. female is here for infusion of: FOLFIRINOX    Patient identification verified by 2 RNs: [x] YES [] NO  Drug/dose verified by 2 RNs: [x] YES [] NO  Current consent present in chart: [x] YES [] NO [] OTHER   Clinic handoff reviewed: [x] YES [] NO [] N/A   Labs reviewed and meet treatment parameters: [x] YES [] NO [] OTHER   Pre-medications given: [x] YES [] NO [] N/A  IV device: [] PIV [x] IVAD [] PICC [] TCVC  Blood return present before and after infusion: [x] YES [] NO     Patient tolerated FOLFIRINOX well without incident.    Patient and family educated regarding:   [x] YES [] NO drugs received    [x] YES [] NO toxities   [x] YES [] NO toxicity management  [x] YES [] NO follow up care    IVAD flushed, remains in place for home infusion. Home 5FU infusion double checked with [facility] RN. Patient discharged home in stable condition, aware of future appointments set.

## 2016-12-28 NOTE — Progress Notes (Signed)
Licensed Massage Therapist did a review of the current and past medical issues including bruising, blood clots, pain swelling and lymphedema as well as any medications the patient may be taking to thin the blood and reduce clotting. Patient gave verbal understanding and consent to the role of the massage being for relaxation and stress reduction. Patient has history of pancreatic cancer. Patient receiving infusion and reported feeling fatigued and had some muscle tension in her shoulders and upper back. Provided gentle massage to her head, neck, shoulders and back while patient was seated upright in chair. Patient responded favorably to massage therapy.

## 2016-12-28 NOTE — Progress Notes (Signed)
[facility] SAME DAY TREATMENT HAND-OFF:   SITUATION:   Scheduled treatment category for today:     Cancer treatment:  Chemotherapy    Is this a new cancer treatment?: No      Consent obtained:  Yes    Location:  [facility]    Labs complete:  Yes    Within parameters: Yes      Current patient status:  Scheduled treatment    OK to treat for scheduled treatment:  Yes

## 2016-12-28 NOTE — Progress Notes (Signed)
MEDICAL ONCOLOGY PROGRESS NOTE    Reason for Evaluation: Follow-up    Diagnosis: Pancreatic adenocarcinoma    Stage: Locally advanced    Performance Status: ECOG PS 1-2    Diagnosis and Treatment History:  1. Over several months, patient noted worsening abdominal pain and weight loss.  Extensive work-up including EGD, colonoscopy and imaging were unrevealing.  After suggestive HIDA scan, she underwent cholecystectomy on September 06, 2016 but symptoms persisted.      2. MRCP on September 25, 2016 demonstrated 2x2cm soft tissue mass-like abnormality in celiac region, contiguous with medial margin of uncinate process of pancreas concerning for pancreatic lession vs lymphadenopathy.    3. EUS/FNA on September 27, 2016 demonstrated a mass in pancreas uncinate process and lymph node pressing celiac axis. FNA confirmed adenocarcinoma.    4. She was transferred to [facility] for additional management.  Abd/pelvis CT on September 30, 2016 revealed ill-defined prominent retroperitoneal soft tissue with central low attenuation suggestive of necrosis which abuts SMA and is adjacent to pancreatic uncinate process similar to prior MRI. Differential includes pancreatic neoplasm versus lymphadenopathy.  CT angio on September 29, 2016 showed no pulmonary embolism.  Nonspecific 3 mm nodules in right upper and middle lobe, recommend attention on follow-up imaging.      5. Diagnostic laparoscopy and port placement by Dr. Ron Agee on October 04, 2016 - no evidence of metastatic disease.    Current Treatment: FOLFIRINOX chemotherapy - Cycle 1 (in-house) on October 05, 2016.  Cycle 2 on October 19, 2016.  Cycle 3 on November 02, 2016.  Cycle 4 on November 16, 2016.  Cycle 5 on November 27, 2016. Cycle 6 on December 14, 2016.  Cycle 7 on December 28, 2016.     Interval History: Since our last visit, she has received another chemotherapy treatment. She is here with her daughter.  Chief complaint: heartburn     First week is completely out of it, just sleeps, can do "absolutely  nothing".  Her son was in town, so she did get up to do a few things. Went to the mall in a wheelchair, got out of the house.   +heartburn, biggest complaint. On protonix BID   No issues with bowels, balanced, on colace and senna   Not a lot of nausea, zofran is in her mediset   Taking decadron   Methadone TID for pain, managed by palliative care   Appetite is okay the second week, makes up for her lack of appetite right after chemo   Didn't get her neulasta this time, usually is delivered from [facility] feeling very tired, sleeping often.  But is able to get out and about - had her car inspected, went shopping at [employer], took the dog to the vet   No mucositis   No HFS   Has noticed that after she is here for chemo when she gets out to the parking garage her mouth "puckers" and she sounds different when she talks.  Daughter notices it more than the patient does.  No pain, but mouth is frozen in a certain position.    Wondering about increasing Lexapro dose for mood        Past Medical/Surgical History:  1. Fibromyalgia  2. Hypothyroidism  3. S/p knee surgery  4. S/p hysterectomy  5. S/p shoulder surgery  6. S/p tonsillectomy    Current Medications:  Current Outpatient Prescriptions on File Prior to Visit   Medication Sig Dispense Refill    methadone (  DOLOPHINE) 5 MG tablet Take 2 tablets (10 mg total) by mouth 2 times daily   Max daily dose: 20 mg And 15 mg at hs; total MDD 35 mg 210 tablet 0    LORazepam (ATIVAN) 0.5 MG tablet Take 1 tablet (0.5 mg total) by mouth 2 times daily as needed for Anxiety   Max daily dose: 1 mg 60 tablet 0    amitriptyline (ELAVIL) 25 MG tablet Take 1 tablet (25 mg total) by mouth nightly 30 tablet 5    dexamethasone (DECADRON) 4 MG tablet Take 1 tablet (4 mg total) by mouth 2 times daily 60 tablet 5    metoclopramide (REGLAN) 5 MG tablet Take 1 tablet (5 mg total) by mouth 3 times daily (before meals) 90 tablet 5    pantoprazole (PROTONIX) 40 MG EC tablet Take 1  tablet (40 mg total) by mouth 2 times daily (before meals)   Swallow whole. Do not crush, break, or chew. 60 tablet 5    senna (SENOKOT) 8.6 MG tablet Take 2 tablets by mouth 2 times daily 120 tablet 5    Meds Exist in Therapy Plan Peg-Filgrastim      heparin 100 UNIT/ML injection 5 mLs (500 Units total) by Intracatheter route as needed (for line care) 30 Syringe 5    sodium chloride 0.9 % flush 10 mLs by Intracatheter route as needed (for line care) 600 mL 5    PREMARIN 0.625 MG tablet       ondansetron (ZOFRAN-ODT) 4 MG disintegrating tablet Take 1 tablet (4 mg total) by mouth 3 times daily as needed for Nausea   Place on top of tongue. 60 tablet 2    Vitamins - senior (CENTRUM SILVER) TABS Take 1 tablet by mouth daily 30 tablet 0    fluorouracil (ADRUCIL) 50 MG/ML injection Administer 3,382 mg into the vein every 14 days   Start in infusion center.  Infuse over 46 hours at home. 3382 mg 2    docusate sodium (COLACE) 100 MG capsule Take 2 capsules (200 mg total) by mouth 2 times daily 120 capsule 5    fluorouracil (ADRUCIL) 50 MG/ML injection Administer 3,401 mg into the vein every 14 days   Start in infusion center.  Infuse over 46 hours at home. 3401 mg 2    levothyroxine (SYNTHROID, LEVOTHROID) 137 MCG tablet Take 1 tablet (137 mcg total) by mouth daily (before breakfast) 30 tablet 0    triamterene-hydrochlorothiazide (MAXZIDE-25) 37.5-25 MG per tablet Take 1 tablet by mouth daily 30 tablet 0     No current facility-administered medications on file prior to visit.        Social History: Divorced.  Worked in [employer].  Daughter [name]) worked on [employer] and now in home care.    Family History: Mother with breast cancer.  Father with cancer of unknown type.    Review of Systems: As per Interval History, remainder of 12-point review of systems otherwise negative    Physical Exam:  There were no vitals filed for this visit.   Gen - NAD, well groomed  HEENT - EOMI, MMMs  Neck -  Supple  Chest - CTAB  Heart - RRR, Nl S1/S2  Abdomen - Soft, nontender, nondistended, BS present  Skin - port site clean and dry  Extremities - No edema  Neurologic - Non-focal    Laboratory Review: CBC and CMP from December 28, 2016 reviewed    Pathology Review: As outlined above    Radiology  Review:   1. Chest CT on November 23, 2016 revealed no new or growing pulmonary nodules.   Suspected small area of catheter-associated thrombus in right internal jugular vein.     2. Abd/pelvis CT on November 23, 2016 showed stable size of uncinate process pancreatic mass.  Significant improvement in ill-defined soft tissue attenuation material surrounding celiac and proximal SMA.  Decreased size of periceliac node.  No evidence of distant metastasis in abdomen or pelvis.    Assessment and Plan: 71 yo F with locally advanced pancreatic adenocarcinoma.  She is currently on 1st line FOLFIRINOX chemotherapy (with Neulasta support).      Proceed today with Cycle 7 of FOLFIRINOX    Heartburn - will add famotidine to daily Protonix.    Fatigue: Fatigue is more severe on week after chemo, but she rebounds well into rest week, Grade 2-3 to Grade 1. Discussed that fatigue related to cancer is multifactorial: cancer, chemotherapy, pain, stress, depression/anxiety, and insomnia all can contribute to cancer related fatigue. Discussed strategies to combat chemotherapy related fatigue such as prioritizing activities, planned rest periods, and regular exercise    Mood - will see how she does with increased dose of escitalopram to 20mg  daily.  If she develops any dizziness, headache, or other symptoms we may need to reduce her dose back to 10mg  daily.     She will return in 2 weeks for her 8th cycle, scans are planned for after 8 cycles.    Patient knows to call back with any questions, concerns or new symptoms.    Martyn Malay, PA-C  Medical Oncology Physician Assistant

## 2016-12-31 MED ORDER — FLUOROURACIL 50 MG/ML IV SOLN *I*
1900.0000 mg/m2 | INTRAVENOUS | 2 refills | Status: DC
Start: 2016-12-31 — End: 2017-08-20

## 2017-01-01 ENCOUNTER — Other Ambulatory Visit: Payer: Self-pay | Admitting: Hospice and Palliative Medicine

## 2017-01-01 MED ORDER — HYDROMORPHONE HCL 4 MG PO TABS *I*
4.0000 mg | ORAL_TABLET | ORAL | 0 refills | Status: AC | PRN
Start: 2017-01-01 — End: 2017-01-08

## 2017-01-01 MED ORDER — METHADONE HCL 5 MG PO TABS *I*
10.0000 mg | ORAL_TABLET | Freq: Two times a day (BID) | ORAL | 0 refills | Status: DC
Start: 2017-01-01 — End: 2017-01-29

## 2017-01-01 NOTE — Telephone Encounter (Signed)
Methadone: 0  Hydromorphone: 4  Last appt:8.28.18  Next appt:11.27.18    Pharm: [facility]     This report was requested by: Mikeal Hawthorne   Reference #: [id number]   [facility]  Patient Name: Amanda Galloway Birth Date: 01-Nov-1945   Address: [address] [place], [place] [address] Sex: Female   Rx Written Rx Dispensed Drug Quantity Days Supply Prescriber Name Payment Method Dispenser   11/30/2016 12/01/2016 methadone hcl 5 mg tablet  210 30 Quill, [place]. #06   11/30/2016 12/01/2016 lorazepam 0.5 mg tablet  60 30 Quill, [place]. #06   10/30/2016 11/02/2016 methadone hcl 5 mg tablet  210 30 Quill, [place]. #06   10/30/2016 11/02/2016 lorazepam 0.5 mg tablet  60 30 Quill, [place]. #06   Others' Prescriptions  Patient Name: [name] Birth Date: [birth year]   Address: [place], [place] [address] Sex: Female   Rx Written Rx Dispensed Drug Quantity Days Supply Prescriber Name Payment Method Dispenser   10/23/2016 10/29/2016 lorazepam 0.5 mg tablet  7 7 Hellems, [place]. #06     Patient Name: [name] Birth Date: 1945/11/10   Address: [place], [place] [address] Sex: Female   Rx Written Rx Dispensed Drug Quantity Days Supply Prescriber Name Payment Method Dispenser   10/18/2016 10/20/2016 methadone hcl 10 mg tablet  24 12 Hellems, Guinevere A [facility]   10/18/2016 10/20/2016 methadone hcl 5 mg tablet  36 12 Hellems, Guinevere A [facility]   10/13/2016 10/14/2016 methadone hcl 10 mg tablet  14 7 Sharyn Dross, [place] [facility]   10/13/2016 10/14/2016 hydromorphone 4 mg tablet  84 7 Sharyn Dross, [place] [facility]   10/13/2016 10/14/2016 methadone hcl 5  mg tablet  21 7 Sharyn Dross, [place] [facility]   10/13/2016 10/14/2016 lorazepam 0.5 mg tablet  7 7 Sharyn Dross, [place] [facility]     Patient Name: [name] Birth Date: [birth year]   Address: [place],  [address] Sex: Female   Rx Written Rx Dispensed Drug Quantity Days Supply Prescriber Name Payment Method Dispenser   09/29/2016 09/29/2016 hydromorphone 0.5 mg/0.5 ml  14ml 4 Tvetenstrand, Christian D MD No Payment [facility]     Patient Name: [name] Birth Date: June 11, 1945   Address: [place],  [address] Sex: Female   Rx Written Rx Dispensed Drug Quantity Days Supply Prescriber Name Payment Method Dispenser   09/17/2016 09/17/2016 oxycodone-acetaminophen 5-325 mg tablet  12 3 Stingu, [place] #[id number]   09/14/2016 09/15/2016 hydrocodone-acetaminophen 5-325 mg tablet  20 5 Basil Dess D MD [place] [phone]   09/06/2016 09/07/2016 hydrocodone-acetaminophen 5-325 mg tablet  20 5 Basil Dess D [place] [phone]   09/04/2016 09/04/2016 hydrocodone-acetaminophen 5-325 mg tablet  15 4 Pennie Rushing [place] [phone]   08/31/2016 08/31/2016 tramadol hcl 50 mg tablet  60 20 [place], [place] [phone]   * - Drugs marked with an asterisk are compound drugs. If the compound drug is made up of more than one controlled substance,  then each controlled substance will be a separate row in the table.

## 2017-01-08 NOTE — Patient Instructions (Signed)
October 2018   Sunday Monday Tuesday Wednesday Thursday Friday Saturday        1     2     3     4     5     6       7     8     9     10     11     12    Port Draw @ 8:00AM    Galloway/Galloway visit @ 9:00AM    FOLFIRINOX @ 10:00AM   13       14     15     16     17     18     19     20       21     22     NEW PATIENT VISIT    2:00 PM   (60 min.)   CAN CTR, GENETICS AND RISK REDUCTION   [facility] Hereditary Risk Screen 23     24     25     26    Port Draw @ 9:00AM    Galloway/Galloway visit @ 10:00AM    FOLFIRINOX @ 11:00AM   27       28     29     30     31                               November 2018   Sunday Monday Tuesday Wednesday Thursday Friday Saturday                       1     2     3       4     5     6     7     8     9    Port Draw @ 7:30AM    Galloway/Galloway visit @ 8:30AM    FOLFIRINOX @ 9:30AM     10       11     12     13     14     15     16  CT Scan@1130a  2hr fast prior 17       18     19 20  Port Draw@930  Amanda Galloway@10am 21     22     23    Folfirinox@9am   24       25     26     27     FOLLOW UP VISIT   10:00 AM   (30 min.)   Amanda Galloway, Timothy, Galloway   [facility] 28     29     30                    Your Oncology team    [phone]    Amanda Galloway  Amanda Galloway  Elicia Lamp RN/Sue Vickki Muff RN  Melanie Conner/Jennifer Lyon Secretaries    FOLFIRINOX   Folfirinox  is the name for a combination of intravenous (IV) chemotherapies.  It includes Fluorouracil (5FU), Leucovorin, Irinotecan and Oxaliplatin   Given every 2 weeks.     Plan to be here for 5 1/2 hours for each treatment.  You will get some chemotherapy at our infusion center.  Then you will go home with a pump with fluorouracil (5FU that will infuse over 46 hours (almost 2 days).  You can not get pump wet so you cannot shower.  You may wash your hair in the sink and take sponge bath.   You will need blood work done before each treatment.   You will need a medi-port placed under your skin on your chest.  This is where you  will be hooked up to an IV to received your chemotherapy.   You will also get a shot called Neulasta, on the day after the pump is taken down.  This medicine helps your body make white blood cells.    You may experience these side effects:   Fatigue   Nausea/vomiting  (Use the anti-nausea medications.  We do not want you to vomit at all.  If you have vomiting or nausea that is not well controlled, call us!)   Decreased blood counts   Red blood cells - if low, you may feel fatigue   White blood cells - if low, you may get an infection   Platelets - if low, it may take longer to stop bleeding   Hand and foot syndrome - be sure to moisture well, we recommend using a thick cream like Eucerin or bag balm   Numbness/tingling in your hands/feet (Neuropathy)   Cold sensitivity   Hair loss/ hair thinning   Allergic reaction   Taste changes   Constipation: call if no bowel movement for over 2 days   Diarrhea: call if more than 4-5 loose, watery stools in a day - SEE HANDOUT!     Mouth sores   Be careful of being out in the sun!  Use SPF of 30 or greater.  (See hand outs for more detailed list.)    Anti-Nausea Medications: Medications for home use.  The day of treatment the [facility] will give you your anti-nausea medications.    Ondansetron (Zofran) 8 mg tablet by mouth, three times a day, if needed   May cause contipation    Prochlorperazine (Compazine) 10 mg tablet by mouth, every 6 hours as needed for nausea    Do not wait until you are very nauseated to start the extra pills.  The minute you feel a little 'off', start taking the medication.    If these pills are not working on your nausea, please call [phone] so we can find something that works for you!  What to expect during your infusion center appointment:    1. We ask that you limit the number of visitors to two at one time. The treatment area gets crowded quickly and limiting the number of people allows the nursing team to better care for  you. It also promotes a more relaxing environment when there is less noise.  2. Children under the age of 62 are not permitted in the [place]. This is for the safety of the child and patients alike.   3. We offer light snacks and drinks in the [place]. Please feel free to bring food from home, however, we ask that you avoid food or meals with strong odors. Alcoholic beverages are not permitted in the [place].  4. There is a television for each treatment chair. Headphones are available for use or you may bring a set with you.  5. Please inform your nurse if you have questions for your treatment team, they  would be happy to get in touch with them while you are here.

## 2017-01-11 ENCOUNTER — Encounter: Payer: Self-pay | Admitting: Oncology

## 2017-01-11 ENCOUNTER — Ambulatory Visit: Payer: PRIVATE HEALTH INSURANCE | Attending: Hematology and Oncology | Admitting: Oncology

## 2017-01-11 ENCOUNTER — Ambulatory Visit: Payer: PRIVATE HEALTH INSURANCE

## 2017-01-11 VITALS — BP 117/60 | HR 83 | Temp 98.2°F | Resp 18 | Ht 65.0 in | Wt 164.1 lb

## 2017-01-11 DIAGNOSIS — F419 Anxiety disorder, unspecified: Secondary | ICD-10-CM | POA: Insufficient documentation

## 2017-01-11 DIAGNOSIS — C259 Malignant neoplasm of pancreas, unspecified: Secondary | ICD-10-CM

## 2017-01-11 DIAGNOSIS — W5503XA Scratched by cat, initial encounter: Secondary | ICD-10-CM | POA: Insufficient documentation

## 2017-01-11 DIAGNOSIS — C250 Malignant neoplasm of head of pancreas: Secondary | ICD-10-CM | POA: Insufficient documentation

## 2017-01-11 DIAGNOSIS — R109 Unspecified abdominal pain: Secondary | ICD-10-CM | POA: Insufficient documentation

## 2017-01-11 DIAGNOSIS — Y929 Unspecified place or not applicable: Secondary | ICD-10-CM | POA: Insufficient documentation

## 2017-01-11 DIAGNOSIS — M549 Dorsalgia, unspecified: Secondary | ICD-10-CM | POA: Insufficient documentation

## 2017-01-11 DIAGNOSIS — R233 Spontaneous ecchymoses: Secondary | ICD-10-CM | POA: Insufficient documentation

## 2017-01-11 DIAGNOSIS — Z5111 Encounter for antineoplastic chemotherapy: Secondary | ICD-10-CM | POA: Insufficient documentation

## 2017-01-11 DIAGNOSIS — I2699 Other pulmonary embolism without acute cor pulmonale: Secondary | ICD-10-CM | POA: Insufficient documentation

## 2017-01-11 DIAGNOSIS — R531 Weakness: Secondary | ICD-10-CM | POA: Insufficient documentation

## 2017-01-11 DIAGNOSIS — S50819A Abrasion of unspecified forearm, initial encounter: Secondary | ICD-10-CM | POA: Insufficient documentation

## 2017-01-11 LAB — CBC AND DIFFERENTIAL
Baso # K/uL: 0 10*3/uL (ref 0.0–0.1)
Basophil %: 0.7 %
Eos # K/uL: 0.1 10*3/uL (ref 0.0–0.4)
Eosinophil %: 1.2 %
Hematocrit: 31 % — ABNORMAL LOW (ref 34–45)
Hemoglobin: 10.4 g/dL — ABNORMAL LOW (ref 11.2–15.7)
IMM Granulocytes #: 0 10*3/uL (ref 0.0–0.1)
IMM Granulocytes: 0.4 %
Lymph # K/uL: 1.3 10*3/uL (ref 1.2–3.7)
Lymphocyte %: 22.4 %
MCH: 35 pg/cell — ABNORMAL HIGH (ref 26–32)
MCHC: 34 g/dL (ref 32–36)
MCV: 103 fL — ABNORMAL HIGH (ref 79–95)
Mono # K/uL: 0.7 10*3/uL (ref 0.2–0.9)
Monocyte %: 12.4 %
Neut # K/uL: 3.6 10*3/uL (ref 1.6–6.1)
Nucl RBC # K/uL: 0 10*3/uL (ref 0.0–0.0)
Nucl RBC %: 0 /100 WBC (ref 0.0–0.2)
Platelets: 240 10*3/uL (ref 160–370)
RBC: 3 MIL/uL — ABNORMAL LOW (ref 3.9–5.2)
RDW: 19.1 % — ABNORMAL HIGH (ref 11.7–14.4)
Seg Neut %: 62.9 %
WBC: 5.7 10*3/uL (ref 4.0–10.0)

## 2017-01-11 LAB — NEUTROPHIL #-INSTRUMENT: Neutrophil #-Instrument: 3.6 10*3/uL

## 2017-01-11 LAB — COMPREHENSIVE METABOLIC PANEL
ALT: 38 U/L — ABNORMAL HIGH (ref 0–35)
AST: 32 U/L (ref 0–35)
Albumin: 4.3 g/dL (ref 3.5–5.2)
Alk Phos: 96 U/L (ref 35–105)
Anion Gap: 14 (ref 7–16)
Bilirubin,Total: <0.2 mg/dL (ref 0.0–1.2)
CO2: 28 mmol/L (ref 20–28)
Calcium: 9 mg/dL (ref 8.6–10.2)
Chloride: 94 mmol/L — ABNORMAL LOW (ref 96–108)
Creatinine: 0.78 mg/dL (ref 0.51–0.95)
GFR,Black: 89 *
GFR,Caucasian: 77 *
Glucose: 100 mg/dL — ABNORMAL HIGH (ref 60–99)
Lab: 36 mg/dL — ABNORMAL HIGH (ref 6–20)
Potassium: 4 mmol/L (ref 3.3–5.1)
Sodium: 136 mmol/L (ref 133–145)
Total Protein: 6.7 g/dL (ref 6.3–7.7)

## 2017-01-11 LAB — CA 19 9 (EFF. 01-2011): CA 19 9 (eff. 01-2011): 45 U/mL — ABNORMAL HIGH (ref 0–35)

## 2017-01-11 MED ORDER — IRINOTECAN HCL 100 MG/5ML IV SOLN *I*
90.0000 mg/m2 | Freq: Once | INTRAVENOUS | Status: AC
Start: 2017-01-11 — End: 2017-01-11
  Administered 2017-01-11: 165 mg via INTRAVENOUS
  Filled 2017-01-11: qty 8.25

## 2017-01-11 MED ORDER — SODIUM CHLORIDE 0.9 % IV SOLN WRAPPED *I*
150.0000 mg | Freq: Once | INTRAVENOUS | Status: AC
Start: 2017-01-11 — End: 2017-01-11
  Administered 2017-01-11: 150 mg via INTRAVENOUS
  Filled 2017-01-11: qty 5

## 2017-01-11 MED ORDER — LEUCOVORIN CALCIUM 350 MG IJ SOLR (50 MG/ML) *I*
320.0000 mg/m2 | Freq: Once | INTRAVENOUS | Status: AC
Start: 2017-01-11 — End: 2017-01-11
  Administered 2017-01-11: 586 mg via INTRAVENOUS
  Filled 2017-01-11: qty 11.72

## 2017-01-11 MED ORDER — OXALIPLATIN 100 MG/20ML IV SOLN *I*
65.0000 mg/m2 | Freq: Once | INTRAVENOUS | Status: AC
Start: 2017-01-11 — End: 2017-01-11
  Administered 2017-01-11: 119 mg via INTRAVENOUS
  Filled 2017-01-11: qty 23.8

## 2017-01-11 MED ORDER — DEXTROSE 5 % IV SOLN WRAPPED *I*
30.0000 mL/h | INTRAVENOUS | Status: DC | PRN
Start: 2017-01-11 — End: 2017-01-11

## 2017-01-11 MED ORDER — PALONOSETRON HCL 0.25 MG/5ML IV SOLN *I*
0.2500 mg | Freq: Once | INTRAVENOUS | Status: AC
Start: 2017-01-11 — End: 2017-01-11
  Administered 2017-01-11: 0.25 mg via INTRAVENOUS
  Filled 2017-01-11: qty 5

## 2017-01-11 MED ORDER — HOME INFUSION START *I*
1.0000 | Freq: Once | Status: AC
Start: 2017-01-11 — End: 2017-01-11
  Administered 2017-01-11: 1 via INTRAVENOUS
  Filled 2017-01-11: qty 1

## 2017-01-11 MED ORDER — DEXAMETHASONE SODIUM PHOSPHATE 10 MG/ML IJ SOLN *I*
10.0000 mg | Freq: Once | INTRAMUSCULAR | Status: AC
Start: 2017-01-11 — End: 2017-01-11
  Administered 2017-01-11: 10 mg via INTRAVENOUS
  Filled 2017-01-11: qty 1

## 2017-01-11 MED ORDER — FLUOROURACIL (ADRUCIL) 1 GM/20ML IV SOLN *I*
320.0000 mg/m2 | Freq: Once | INTRAVENOUS | Status: AC
Start: 2017-01-11 — End: 2017-01-11
  Administered 2017-01-11: 586 mg via INTRAVENOUS
  Filled 2017-01-11: qty 11.72

## 2017-01-11 NOTE — Progress Notes (Signed)
MEDICAL ONCOLOGY PROGRESS NOTE    Reason for Evaluation: Follow-up    Diagnosis: Pancreatic adenocarcinoma    Stage: Locally advanced    Performance Status: ECOG PS 1-2    Diagnosis and Treatment History:  1. Over several months, patient noted worsening abdominal pain and weight loss.  Extensive work-up including EGD, colonoscopy and imaging were unrevealing.  After suggestive HIDA scan, she underwent cholecystectomy on September 06, 2016 but symptoms persisted.      2. MRCP on September 25, 2016 demonstrated 2x2cm soft tissue mass-like abnormality in celiac region, contiguous with medial margin of uncinate process of pancreas concerning for pancreatic lession vs lymphadenopathy.    3. EUS/FNA on September 27, 2016 demonstrated a mass in pancreas uncinate process and lymph node pressing celiac axis. FNA confirmed adenocarcinoma.    4. She was transferred to [facility] for additional management.  Abd/pelvis CT on September 30, 2016 revealed ill-defined prominent retroperitoneal soft tissue with central low attenuation suggestive of necrosis which abuts SMA and is adjacent to pancreatic uncinate process similar to prior MRI. Differential includes pancreatic neoplasm versus lymphadenopathy.  CT angio on September 29, 2016 showed no pulmonary embolism.  Nonspecific 3 mm nodules in right upper and middle lobe, recommend attention on follow-up imaging.      5. Diagnostic laparoscopy and port placement by Dr. Ron Agee on October 04, 2016 - no evidence of metastatic disease.    Current Treatment: FOLFIRINOX chemotherapy - Cycle 1 (in-house) on October 05, 2016.  Cycle 2 on October 19, 2016.  Cycle 3 on November 02, 2016.  Cycle 4 on November 16, 2016.  Cycle 5 on November 27, 2016. Cycle 6 on December 14, 2016.  Cycle 7 on December 28, 2016. Cycle 8 on January 11, 2017.    Interval History: Since our last visit, she has received another chemotherapy treatment. She is here with her daughter.  Chief complaint: cold sensitivity     Friday of her last treatment she  drank ice cold water and had an esophageal spasm, freaked out, felt like she couldn't breathe.  Symptoms resolved. +cold sensitivity lasts about 1 week   No N/V, using her antiemetics   +Taste changes, enjoying cottage chese and fruit   On dexamethasone daily, doesn't feel it's helping her energy   During the past two weeks energy was okay.  Tends to do not much of anything the first week.  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 Fibromyalgia  2. Hypothyroidism  3. S/p knee surgery  4. S/p hysterectomy  5. S/p shoulder surgery  6. S/p tonsillectomy    Current Medications:  Current Outpatient Prescriptions on File Prior to Visit   Medication Sig Dispense Refill    ondansetron (ZOFRAN-ODT) 4 MG disintegrating tablet Take 1 tablet (4 mg total) by mouth 3 times daily as needed for Nausea   Place on top of tongue. 60 tablet 2    methadone (DOLOPHINE) 5 MG tablet Take 2 tablets (10 mg total) by mouth 2 times daily   Max daily dose: 20 mg  And 15 mg at hs; total MDD 35 mg 210 tablet 0    pegfilgrastim (NEULASTA) 6 MG/0.6ML injection syringe Inject 0.6 mLs (6 mg total) into the skin once 0.6 mL 3    famotidine (PEPCID) 20 MG tablet Take 1 tablet (20 mg total) by mouth 2 times daily 60 tablet 3    escitalopram (LEXAPRO) 20 MG tablet Take 1 tablet (20 mg total) by mouth daily 30 tablet 3    LORazepam (ATIVAN) 0.5 MG tablet Take 1 tablet (0.5 mg total) by mouth 2 times daily as needed for Anxiety   Max daily dose: 1 mg 60 tablet 0    fluorouracil (ADRUCIL) 50 MG/ML injection Administer 3,382 mg into the vein every 14 days   Start in infusion center.   Infuse over 46 hours at home. 3382 mg 2    amitriptyline (ELAVIL) 25 MG tablet Take 1 tablet (25 mg total) by mouth nightly 30 tablet 5    dexamethasone (DECADRON) 4 MG tablet Take 1 tablet (4 mg total) by mouth 2 times daily 60 tablet 5    docusate sodium (COLACE) 100 MG capsule Take 2 capsules (200 mg total) by mouth 2 times daily 120 capsule 5    metoclopramide (REGLAN) 5 MG tablet Take 1 tablet (5 mg total) by mouth 3 times daily (before meals) 90 tablet 5    pantoprazole (PROTONIX) 40 MG EC tablet Take 1 tablet (40 mg total) by mouth 2 times daily (before meals)   Swallow whole. Do not crush, break, or chew. 60 tablet 5    senna (SENOKOT) 8.6 MG tablet Take 2 tablets by mouth 2 times daily 120 tablet 5    Meds Exist in Therapy Plan Peg-Filgrastim      fluorouracil (ADRUCIL) 50 MG/ML injection Administer 3,401 mg into the vein every 14 days   Start in infusion center.  Infuse over 46 hours at home. 3401 mg 2    heparin 100 UNIT/ML injection 5 mLs (500 Units total) by Intracatheter route as needed (for line care) 30 Syringe 5    sodium chloride 0.9 % flush 10 mLs by Intracatheter route as needed (for line care) 600 mL 5    PREMARIN 0.625 MG tablet       levothyroxine (SYNTHROID, LEVOTHROID) 137 MCG tablet Take 1 tablet (137 mcg total) by mouth daily (before breakfast) 30 tablet 0    Vitamins - senior (CENTRUM SILVER) TABS Take 1 tablet by mouth daily 30 tablet 0    triamterene-hydrochlorothiazide (MAXZIDE-25) 37.5-25 MG per tablet Take 1 tablet by mouth daily 30 tablet 0     No current facility-administered medications on file prior to visit.        Social History: Divorced.  Worked in [employer].  Daughter [name]) worked on [employer] and now in home care.    Family History: Mother with breast cancer.  Father with cancer of unknown type.    Review of Systems: As per Interval History, remainder of 12-point review of systems otherwise negative    Physical Exam:  Vitals:    01/11/17  0759   BP: 117/60   Pulse: 83   Resp: 18   Temp: 36.8 C (98.2 F)   Weight: 74.4 kg (164 lb 1.6 oz)   Height: 165.1 cm (5\' 5" )   Gen - NAD, well groomed, alert  HEENT - EOMI, MMMs  Neck - Supple  Chest - CTAB  Heart - RRR, Nl S1/S2  Abdomen - Soft, nontender, nondistended, BS present  Skin - port site clean and dry  Extremities - +sock line edema bilaterally, minimal  Neurologic - Non-focal       Laboratory Review: CBC and CMP from January 11, 2017 reviewed    Pathology Review: As outlined above    Radiology Review:   1. Chest CT on November 23, 2016 revealed no new or growing pulmonary nodules.   Suspected small area of catheter-associated thrombus in right internal jugular vein.     2. Abd/pelvis CT on November 23, 2016 showed stable size of uncinate process pancreatic mass.  Significant improvement in ill-defined soft tissue attenuation material surrounding celiac and proximal SMA.  Decreased size of periceliac node.  No evidence of distant metastasis in abdomen or pelvis.    Assessment and Plan: 71 yo F with locally advanced pancreatic adenocarcinoma.  She is currently on 1st line FOLFIRINOX chemotherapy (with Neulasta support).      Proceed today with Cycle 8 of FOLFIRINOX    Cold sensitivity - advised to avoid cold following treatments    Fatigue - encouraged exercise, RN gave information about programs at the [facility].  Discussed that fatigue related to cancer is multifactorial: cancer, chemotherapy, pain, stress, depression/anxiety, and insomnia all can contribute to cancer related fatigue. Discussed strategies to combat chemotherapy related fatigue such as prioritizing activities, planned rest periods, and regular exercise    Weight gain - Will try decreasing dexamethasone dose to 2mg  BID (from 4mg )    She has scans scheduled next week and MD follow up after to review.    Patient knows to call back with any questions, concerns or new symptoms.    Martyn Malay, PA-C  Medical Oncology Physician  Assistant

## 2017-01-11 NOTE — Progress Notes (Signed)
Patient arrived to clinic in stable condition to receive Folfirinox - no new complaints to report. Labs within parameters. Patient tolerated infusions well without complication. IVAD provided blood return before and after treatment. Left accessed for home infusion. [facility] RN here for hook up. Encouraged to call with questions/concerns. Discharged in stable condition to home, ambulatory.

## 2017-01-11 NOTE — Progress Notes (Signed)
IVAD accessed with good blood return. Labs obtained per orders. Remains accessed for treatment. Tolerated well.

## 2017-01-11 NOTE — Progress Notes (Signed)
[facility] SAME DAY TREATMENT HAND-OFF:   SITUATION:   Scheduled treatment category for today:     Cancer treatment:  Chemotherapy    Is this a new cancer treatment?: No      Consent obtained:  Yes    Location:  [facility]    Labs complete:  Yes    Within parameters: Yes      Current patient status:  Scheduled treatment    OK to treat for scheduled treatment:  Yes

## 2017-01-12 ENCOUNTER — Encounter: Payer: Self-pay | Admitting: Oncology

## 2017-01-16 NOTE — Patient Instructions (Signed)
November 2018      Sunday Monday Tuesday Wednesday Thursday Friday Saturday                       1     2     3       4     5     6     7     8     9    INJECTION   7:30 AM   (15 min.)   Abram, Erica, RN   [facility]    FOLLOW UP VISIT   8:30 AM   (30 min.)   Hellems, Guinevere A, PA   [facility]    CHEMOTHERAPY   9:30 AM   (300 min.)   Mulvaney, Megan C, RN   [facility] 10       11     12     13     14     15     16    CT ABD PELVIS WITH CONTRAST  11:30 AM   (20 min.)   RIS, RR CT1 (RRCT1)   [facility] [address] 17       18     19     20    INJECTION   9:30 AM   (15 min.)   CAN CTR, POD E1   [facility]    FOLLOW UP VISIT  10:00 AM   (30 min.)   Tejani, Mohamed, MD   [facility] 21     22     23    CHEMOTHERAPY   9:00 AM   (300 min.)   CAN CTR, POD F   [facility] 24       25     26    FOLLOW UP VISIT   3:30 PM   (30 min.)   Galka, Eva, MD   [facility] 27    FOLLOW UP VISIT  10:00 AM   (30 min.)   Quill, Timothy, MD   [facility] 28     29     03 March 2017      Sunday Monday Tuesday Wednesday Thursday Friday Saturday                                 1       2     3     4     5     6     7     8       9     10     11     12     13     14     15       16     17     18     19     20     21     22       23     24     25     26   27     28     29       30     04 April 2017      Sunday Monday Tuesday Wednesday Thursday Friday Saturday             1     2     3     4     5       6     7     8     9     10     11     12       13     14     15     16     17     18     19       20     21     22     23     24     25     26       27     28     29     30     31                            Your Oncology team    [phone]    Kathlen Brunswick MD  [place]  PA  Elicia Lamp RN/Claudia Nanetta Batty RN/Sue Vickki Muff RN  [employer] Secretary    FOLFIRINOX   Folfirinox  is the name for a combination of intravenous (IV) chemotherapies.  It includes Fluorouracil (5FU), Leucovorin, Irinotecan and Oxaliplatin   Given every 2 weeks.     Plan to be here for 5 1/2 hours for each treatment.     You will get some chemotherapy at our infusion center.  Then you will go home with a pump with fluorouracil (5FU that will infuse over 46 hours (almost 2 days).  You can not get pump wet so you cannot shower.  You may wash your hair in the sink and take sponge bath.   You will need blood work done before each treatment.   You will need a medi-port placed under your skin on your chest.  This is where you will be hooked up to an IV to received your chemotherapy.   You will also get a shot called Neulasta, on the day after the pump is taken down.  This medicine helps your body make white blood cells.    You may experience these side effects:   Fatigue   Nausea/vomiting  (Use the anti-nausea medications.  We do not want you to vomit at all.  If you have vomiting or nausea that is not well controlled, call us!)   Decreased blood counts   Red blood cells - if low, you may feel fatigue   White blood cells - if low, you may get an infection   Platelets - if low, it may take longer to stop bleeding   Hand and foot syndrome - be sure to moisture well, we recommend using a thick cream like Eucerin or bag balm   Numbness/tingling in your hands/feet (Neuropathy)  Cold sensitivity   Hair loss/ hair thinning   Allergic reaction   Taste changes   Constipation: call if no bowel movement for over 2 days   Diarrhea: call if more than 4-5 loose, watery stools in a day - SEE HANDOUT!     Mouth sores   Be careful of being out in the sun!  Use SPF of 30 or greater.  (See hand outs for more detailed list.)  Anti-Nausea Medications: Medications for home use.  The day of treatment the [facility] will give you your anti-nausea medications.    Ondansetron (Zofran) 8 mg tablet by mouth, three times a day, if needed   May cause contipation    Prochlorperazine (Compazine) 10 mg tablet by mouth, every 6 hours as needed for nausea    Do not wait until you are very nauseated to start the extra pills.  The minute you feel a little 'off', start taking the medication.    If these pills are not working on your nausea, please call [phone] so we can find something that works for you!

## 2017-01-18 ENCOUNTER — Telehealth: Payer: Self-pay | Admitting: Hematology and Oncology

## 2017-01-18 ENCOUNTER — Telehealth: Payer: Self-pay

## 2017-01-18 ENCOUNTER — Other Ambulatory Visit: Payer: Self-pay | Admitting: Oncology

## 2017-01-18 ENCOUNTER — Encounter: Payer: Self-pay | Admitting: Radiology

## 2017-01-18 ENCOUNTER — Ambulatory Visit
Admission: RE | Admit: 2017-01-18 | Discharge: 2017-01-18 | Disposition: A | Discharge status: Home / Self Care | Payer: PRIVATE HEALTH INSURANCE | Source: Ambulatory Visit | Attending: Radiology | Admitting: Radiology

## 2017-01-18 DIAGNOSIS — I2699 Other pulmonary embolism without acute cor pulmonale: Secondary | ICD-10-CM

## 2017-01-18 DIAGNOSIS — C259 Malignant neoplasm of pancreas, unspecified: Secondary | ICD-10-CM

## 2017-01-18 DIAGNOSIS — C257 Malignant neoplasm of other parts of pancreas: Secondary | ICD-10-CM | POA: Insufficient documentation

## 2017-01-18 DIAGNOSIS — R918 Other nonspecific abnormal finding of lung field: Secondary | ICD-10-CM | POA: Insufficient documentation

## 2017-01-18 MED ORDER — IOHEXOL 350 MG/ML (OMNIPAQUE) IV SOLN *I*
1.0000 mL | Freq: Once | INTRAVENOUS | Status: AC
Start: 2017-01-18 — End: 2017-01-18
  Administered 2017-01-18: 150 mL via INTRAVENOUS

## 2017-01-18 MED ORDER — STERILE WATER FOR IRRIGATION IR SOLN *I*
900.0000 mL | Freq: Once | Status: AC
Start: 2017-01-18 — End: 2017-01-18
  Administered 2017-01-18: 900 mL via ORAL

## 2017-01-18 MED ORDER — SODIUM CHLORIDE 0.9 % INJ (FLUSH) WRAPPED *I*
20.0000 mL | Freq: Once | Status: AC
Start: 2017-01-18 — End: 2017-01-18
  Administered 2017-01-18: 20 mL via INTRAVENOUS

## 2017-01-18 MED ORDER — ENOXAPARIN SODIUM 80 MG/0.8ML IJ SOSY *I*
80.0000 mg | PREFILLED_SYRINGE | Freq: Two times a day (BID) | INTRAMUSCULAR | 5 refills | Status: DC
Start: 2017-01-18 — End: 2017-01-28

## 2017-01-18 MED ORDER — HEPARIN LOCK FLUSH 10 UNIT/ML IJ SOLN WRAPPED *I*
50.0000 [IU] | Freq: Once | INTRAVENOUS | Status: DC
Start: 2017-01-18 — End: 2017-01-18

## 2017-01-18 MED ORDER — HEPARIN LOCK FLUSH 10 UNIT/ML IJ SOLN WRAPPED *I*
50.0000 [IU] | Freq: Once | INTRAVENOUS | Status: AC
Start: 2017-01-18 — End: 2017-01-18
  Administered 2017-01-18: 50 [IU]

## 2017-01-18 MED ORDER — SODIUM CHLORIDE 0.9 % INJ (FLUSH) WRAPPED *I*
20.0000 mL | Freq: Once | Status: AC
Start: 2017-01-18 — End: 2017-01-18

## 2017-01-18 NOTE — Patient Instructions (Signed)
Patient Education           Enoxaparin (ee noks a PA rin)    Brand Names: [name] Lovenox   Brand Names: [place] Lovenox; Lovenox HP; Lovenox With Preservative   Warning    People who have any type of spinal or epidural procedure are more likely to have bleeding problems around the spine when already on this drug. This bleeding rarely happens, but can lead to not being able to move body (paralysis) long-term or paralysis that will not go away. The risk is raised in people who have problems with their spine, a certain type of epidural catheter, or have had spinal surgery. The risk is also raised in people who take any other drugs that may affect how the blood clots like blood-thinner drugs (like warfarin), aspirin, or nonsteroidal anti-inflammatory drugs (NSAIDs). Talk with the doctor.   Tell your doctor you use this drug before you have a spinal or epidural procedure. Call your doctor right away if you have any signs of nerve problems like back pain, numbness or tingling, muscle weakness, paralysis, or loss of bladder or bowel control.   Talk with your doctor if you have recently had or will be having a spinal or epidural procedure. Some time may need to pass between the use of this drug and your procedure. Talk with your doctor.  What is this drug used for?    It is used to thin the blood so that clots will not form.   It is used to treat blood clots.   It is used to lower the number of heart attacks in patients who have unstable angina or mild heart attacks.  What do I need to tell my doctor BEFORE I take this drug?    If you have an allergy to enoxaparin or any other part of this drug.   If you are allergic to pork products, talk with the doctor.   If you have an allergy to benzyl alcohol, talk with your doctor.   If you are allergic to any drugs like this one, any other drugs, foods, or other substances. Tell your doctor about the allergy and what signs you had, like rash; hives; itching; shortness of  breath; wheezing; cough; swelling of face, lips, tongue, or throat; or any other signs.   If you have ever had a low platelet count during past use of this drug or another drug like this one.   If you have bleeding problems.   This is not a list of all drugs or health problems that interact with this drug.   Tell your doctor and pharmacist about all of your drugs (prescription or OTC, natural products, vitamins) and health problems. You must check to make sure that it is safe for you to take this drug with all of your drugs and health problems. Do not start, stop, or change the dose of any drug without checking with your doctor.  What are some things I need to know or do while I take this drug?    Tell dentists, surgeons, and other doctors that you use this drug.   You may bleed more easily. Be careful and avoid injury. Use a soft toothbrush and an Copy.   Have blood work checked as you have been told by the doctor. Talk with the doctor.   Use care if you weigh less than 100 pounds (45 kilograms).   Very bad and sometimes deadly bleeding problems have happened with this drug. Talk  with the doctor.   If you are 60 or older, use this drug with care. You could have more side effects.   Some products have benzyl alcohol. Do not give a product that has benzyl alcohol in it to a newborn. Talk with the doctor to see if this product has benzyl alcohol in it.   Tell your doctor if you are pregnant or plan on getting pregnant. You will need to talk about the benefits and risks of using this drug while you are pregnant.   Tell your doctor if you are breast-feeding. You will need to talk about any risks to your baby.  What are some side effects that I need to call my doctor about right away?    WARNING/CAUTION: Even though it may be rare, some people may have very bad and sometimes deadly side effects when taking a drug. Tell your doctor or get medical help right away if you have any of the following  signs or symptoms that may be related to a very bad side effect:   Signs of an allergic reaction, like rash; hives; itching; red, swollen, blistered, or peeling skin with or without fever; wheezing; tightness in the chest or throat; trouble breathing or talking; unusual hoarseness; or swelling of the mouth, face, lips, tongue, or throat.   Signs of bleeding like throwing up blood or throw up that looks like coffee grounds; coughing up blood; blood in the urine; black, red, or tarry stools; bleeding from the gums; vaginal bleeding that is not normal; bruises without a reason or that get bigger; or any bleeding that is very bad or that you cannot stop.   Very bad dizziness or passing out.   A fall or crash when you hit your head. Talk with your doctor even if you feel fine.   Change in thinking clearly and with logic.   Very bad headache.   A burning, numbness, or tingling feeling that is not normal.   Muscle weakness.  What are some other side effects of this drug?    All drugs may cause side effects. However, many people have no side effects or only have minor side effects. Call your doctor or get medical help if any of these side effects or any other side effects bother you or do not go away:   Upset stomach.   Irritation where the shot is given.   Loose stools (diarrhea).   These are not all of the side effects that may occur. If you have questions about side effects, call your doctor. Call your doctor for medical advice about side effects.   You may report side effects to your national health agency.  How is this drug best taken?    Use this drug as ordered by your doctor. Read all information given to you. Follow all instructions closely.   Use as you have been told, even if you feel well.   To gain the most benefit, do not miss doses.   It is given as a shot into the fatty part of the skin on the right or left side of the belly.   Your doctor may teach you how to give the shot.   Follow how  to use as you have been told by the doctor or read the package insert.   Wash your hands before and after use.   Do not use if the solution is cloudy, leaking, or has particles.   Do not use if solution changes color.  Move the site where you give the shot with each shot.   If using prefilled syringe, do not get rid of air bubble from syringe before giving.   Throw away needles in a needle/sharp disposal box. Do not reuse needles or other items. When the box is full, follow all local rules for getting rid of it. Talk with a doctor or pharmacist if you have any questions.  What do I do if I miss a dose?    Take a missed dose as soon as you think about it.   If it is close to the time for your next dose, skip the missed dose and go back to your normal time.   Do not take 2 doses at the same time or extra doses.  How do I store and/or throw out this drug?    Store at room temperature.   Store in a dry place. Do not store in a bathroom.   Keep all drugs out of the reach of children and pets.   Check with your pharmacist about how to throw out unused drugs.  General drug facts    If your symptoms or health problems do not get better or if they become worse, call your doctor.   Do not share your drugs with others and do not take anyone else's drugs.   Keep a list of all your drugs (prescription, natural products, vitamins, OTC) with you. Give this list to your doctor.   Talk with the doctor before starting any new drug, including prescription or OTC, natural products, or vitamins.   Some drugs may have another patient information leaflet. If you have any questions about this drug, please talk with your doctor, pharmacist, or other health care provider.   If you think there has been an overdose, call your poison control center or get medical care right away. Be ready to tell or show what was taken, how much, and when it happened.  Consumer Information Use and Disclaimer    This information should not  be used to decide whether or not to take this medicine or any other medicine. Only the healthcare provider has the knowledge and training to decide which medicines are right for a specific patient. This information does not endorse any medicine as safe, effective, or approved for treating any patient or health condition. This is only a brief summary of general information about this medicine. It does NOT include all information about the possible uses, directions, warnings, precautions, interactions, adverse effects, or risks that may apply to this medicine. This information is not specific medical advice and does not replace information you receive from the healthcare provider. You must talk with the healthcare provider for complete information about the risks and benefits of using this medicine.  Last Reviewed Date   2012-05-21  Copyright     2015 [place] its affiliates and/or licensors. All rights reserved.

## 2017-01-18 NOTE — Patient Instructions (Addendum)
Patient Education           How to Give a Subcutaneous Injection    Why is this procedure done?   A subcutaneous injection is also called a shot. It is also called a SubQ shot. With this kind of shot, you use a small, short needle to give the drug into the fat right under your skin. Some drugs:   Work better and faster when given this way.   Can only be given into the fat.   May be given SubQ if you are not able to take them by mouth.     What will the results be?   Your body will take up the drug slowly and efficiently when given into the fat under your skin.  What happens before the procedure?    Make sure you have all of the items that you will need. Place them on a clean surface nearby.   An alcohol wipe   A 2 x 2 sterile gauze or a cotton ball   A syringe filled with the correct drug and dosage   A container to throw away the syringe and needle. A heavy plastic detergent bottle with a lid or other solid container will work.   A bandaid   Check the drug.   Read the label to make sure that you have the correct drug.   Make sure you have the right dose. Check the amount of drug against what the doctor ordered.   Check the expiration date. Do not use it if it is expired. Do not use if there are crystals, sediment, particles, unusual color, or cloudiness in the drug.   Review how to draw up the drug if you are not using a prefilled syringe.   Check the syringe.   See that you have the correct size needle for your body type.   Get rid of any air from the syringe. Do not get rid of any of the drug.   Wash your hands with warm, soapy water before giving the shot.   Select a site for the shot. A SubQ injection may be given in the:   Back part of the upper arm between the elbow and the shoulder.   Front or side of the belly. Make sure the shot is at least 2 inches away from the belly button. Always avoid any surgical scar or other injury.   Middle front or middle outside of the thigh between the  knee and hip.   Lower back. Give the shot just below the waist at the top of the buttocks where the hips begin to curve, far away from the spine.   Use the alcohol wipe to clean the area where you are going to give the shot. Let it air dry. Do not fan or blow on the area.   Keep a record of where you give yourself a shot. Use a different site each time. Giving shots in the same spot will cause scar tissue to form in that area. Scar tissue will make it hard for drugs to be taken up by the body. It will also make it hard to put the needle in your skin.  What happens during the procedure?    Remove the needle cap with the hand you do not write with.   Pinch a 1 to 2-inch fold of skin between your thumb and first finger with this hand.   Hold the syringe in the hand you write with. Hold it  the way you would hold a pencil or dart.   Holding the syringe firmly, quickly push the needle into the skin. Use a 45 angle so the needle will go through the skin and in the fat. For shorter needles or insulin injections, you may need to use a 90 angle. Ask your doctor about the right angle for your drug and needle size.   Slowly push the plunger to give the drug.   Take the needle out of the skin at the same angle you put it in.   If there is bleeding, put firm pressure on the site. Hold a sterile gauze pad or cotton ball over the puncture mark.   Put a bandaid on the site after the bleeding has stopped. Do not rub or massage the area.   Put the syringe and needle into the container to throw them away right after giving the shot.  What happens after the procedure?    You may feel some pain at the site of the shot, like a pin prick. It may last for a minute or two and then stop.   Find out what services are available in your area for disposing of needles and syringes.  What follow-up care is needed?   Your doctor may ask you to make visits to the office to check on your progress. Be sure to keep these visits.  What  problems could happen?   You may have some bleeding, soreness, mild bruising, or redness at the site.  When do I need to call the doctor?    Problem giving yourself the shot   Rash, swelling, or bleeding that does not stop at the site of shot   Very bad pain   Drug is injected into the wrong area   Fever or allergic reaction develops  Where can I learn more?   [place] [facility]  [email or web address].net/.pdf   Last Reviewed Date   2012-10-22  Consumer Information Use and Disclaimer   This information is not specific medical advice and does not replace information you receive from your health care provider. This is only a brief summary of general information. It does NOT include all information about conditions, illnesses, injuries, tests, procedures, treatments, therapies, discharge instructions or life-style choices that may apply to you. You must talk with your health care provider for complete information about your health and treatment options. This information should not be used to decide whether or not to accept your health care providers advice, instructions or recommendations. Only your health care provider has the knowledge and training to provide advice that is right for you.  Copyright   Copyright  2015 [employer] and its affiliates and/or licensors. All rights reserved.

## 2017-01-18 NOTE — Progress Notes (Signed)
[facility] SOCIAL WORK:   [facility] Social Work Therapist, nutritional:     Risk Factors:  Financial    Social Work post assessment plan:  As needed follow-up and Referral  Referral:     Pharmacy Assistance:  Pharmacy voucher completed to cover 2 week supply of Lovenox as pt needs to start today and prior authorization is pending    Time spent on this encounter (in minutes):  15    [place] date:  January 18, 2017    Patient Name: Amanda Galloway      Medical Record #: [id number]   DOB: September 13, 1945  Patients Address: [address]                Social Worker: Mercy Riding, LMSW       Date of Service: January 18, 2017       Funding Source: [employer]  ___________________________________________________________________    Pharmacy Information:  Date/time sent: January 18, 2017     Time needed: asap    Patient Location: Outpatient    Medication Pick-up Preference: Patient will pick up at the pharmacy    Pharmacy Contact: Angelica  Social work signature: Mercy Riding, LMSW  Supervisor/Manager Approval (if indicated):  Date:  (supervisor signature not required for Medicaid pending)

## 2017-01-18 NOTE — Progress Notes (Signed)
Patient successfully demonstrated how to administer subcutaneous injection to self.    She will call with any questions or concerns.

## 2017-01-18 NOTE — Telephone Encounter (Signed)
Amanda Galloway is aware that she has a PE that was noted on her most recent CT Scan, and needs to start taking lovenox injections.    Her daughter is a Marine scientist, but unfortunately is out of town for the weekend.   She understands that she needs to come to [facility] for instruction on how to administer a subcutaneous injection to herself.     She will be here within the hour.

## 2017-01-21 ENCOUNTER — Telehealth: Payer: Self-pay | Admitting: Hematology and Oncology

## 2017-01-21 NOTE — Telephone Encounter (Signed)
Spoke with Ronny Bacon, [place] nurse.  Patient instructed to apply pressure to scratch.   Has appointment with [name]  tomorrow.  Daughter is a [employer] scientist and will f/u with [name] if necessary.

## 2017-01-22 ENCOUNTER — Encounter: Payer: Self-pay | Admitting: Hematology and Oncology

## 2017-01-22 ENCOUNTER — Ambulatory Visit: Payer: PRIVATE HEALTH INSURANCE

## 2017-01-22 ENCOUNTER — Ambulatory Visit: Payer: PRIVATE HEALTH INSURANCE | Admitting: Hematology and Oncology

## 2017-01-22 VITALS — BP 120/77 | HR 89 | Temp 97.9°F | Resp 18 | Ht 65.0 in | Wt 155.9 lb

## 2017-01-22 DIAGNOSIS — C259 Malignant neoplasm of pancreas, unspecified: Secondary | ICD-10-CM

## 2017-01-22 LAB — CBC AND DIFFERENTIAL
Baso # K/uL: 0.1 10*3/uL (ref 0.0–0.1)
Basophil %: 0.9 %
Eos # K/uL: 0.1 10*3/uL (ref 0.0–0.4)
Eosinophil %: 0.9 %
Hematocrit: 36 % (ref 34–45)
Hemoglobin: 12 g/dL (ref 11.2–15.7)
Lymph # K/uL: 3.9 10*3/uL — ABNORMAL HIGH (ref 1.2–3.7)
Lymphocyte %: 25.7 %
MCH: 35 pg/cell — ABNORMAL HIGH (ref 26–32)
MCHC: 33 g/dL (ref 32–36)
MCV: 105 fL — ABNORMAL HIGH (ref 79–95)
Mono # K/uL: 1 10*3/uL — ABNORMAL HIGH (ref 0.2–0.9)
Monocyte %: 7.1 %
Neut # K/uL: 9.3 10*3/uL — ABNORMAL HIGH (ref 1.6–6.1)
Nucl RBC # K/uL: 0 10*3/uL (ref 0.0–0.0)
Nucl RBC %: 0.3 /100 WBC — ABNORMAL HIGH (ref 0.0–0.2)
Platelets: 286 10*3/uL (ref 160–370)
RBC: 3.4 MIL/uL — ABNORMAL LOW (ref 3.9–5.2)
RDW: 18.2 % — ABNORMAL HIGH (ref 11.7–14.4)
Seg Neut %: 60.1 %
WBC: 14.5 10*3/uL — ABNORMAL HIGH (ref 4.0–10.0)

## 2017-01-22 LAB — DIFF MANUAL
Bands %: 4 % (ref 0–10)
Diff Based On: 113 CELLS
Promyelocyte %: 1 % — ABNORMAL HIGH (ref 0–0)
React Lymph %: 1 % (ref 0–6)

## 2017-01-22 LAB — COMPREHENSIVE METABOLIC PANEL
ALT: 73 U/L — ABNORMAL HIGH (ref 0–35)
AST: 63 U/L — ABNORMAL HIGH (ref 0–35)
Albumin: 4.4 g/dL (ref 3.5–5.2)
Alk Phos: 157 U/L — ABNORMAL HIGH (ref 35–105)
Anion Gap: 16 (ref 7–16)
Bilirubin,Total: <0.2 mg/dL (ref 0.0–1.2)
CO2: 26 mmol/L (ref 20–28)
Calcium: 9.4 mg/dL (ref 8.6–10.2)
Chloride: 94 mmol/L — ABNORMAL LOW (ref 96–108)
Creatinine: 1.06 mg/dL — ABNORMAL HIGH (ref 0.51–0.95)
GFR,Black: 61 *
GFR,Caucasian: 53 * — AB
Glucose: 83 mg/dL (ref 60–99)
Lab: 26 mg/dL — ABNORMAL HIGH (ref 6–20)
Potassium: 3.5 mmol/L (ref 3.3–5.1)
Sodium: 136 mmol/L (ref 133–145)
Total Protein: 7 g/dL (ref 6.3–7.7)

## 2017-01-22 LAB — CA 19 9 (EFF. 01-2011): CA 19 9 (eff. 01-2011): 44 U/mL — ABNORMAL HIGH (ref 0–35)

## 2017-01-22 LAB — RBC MORPHOLOGY

## 2017-01-22 LAB — NEUTROPHIL #-INSTRUMENT: Neutrophil #-Instrument: 8 10*3/uL

## 2017-01-22 NOTE — Patient Instructions (Signed)
November 2018      Sunday Monday Tuesday Wednesday Thursday Friday Saturday                       1     2     3       4     5     6     7     8     9    INJECTION   7:30 AM   (15 min.)   Abram, Erica, RN   [facility]    FOLLOW UP VISIT   8:30 AM   (30 min.)   Hellems, Guinevere A, PA   [facility]    CHEMOTHERAPY   9:30 AM   (300 min.)   Mulvaney, Megan C, RN   [facility] 10       11     12     13     14     15     16    CT ABD PELVIS WITH CONTRAST  11:30 AM   (20 min.)   RIS, RR CT1 (RRCT1)   [facility] [address] 17       18     19     20    INJECTION   9:30 AM   (15 min.)   CAN CTR, POD E1   [facility]    FOLLOW UP VISIT  10:00 AM   (30 min.)   Tejani, Mohamed, MD   [facility] 21     22     23    CHEMOTHERAPY   9:00 AM   (300 min.)   CAN CTR, POD F   [facility] 24       25     26    FOLLOW UP VISIT   3:30 PM   (30 min.)   Galka, Eva, MD   [facility] 27    FOLLOW UP VISIT  10:00 AM   (30 min.)   Quill, Timothy, MD   [facility] 28     29     03 March 2017      Sunday Monday Tuesday Wednesday Thursday Friday Saturday                                 1       2     3     4     5     6     7     8       9     10     11     12     13     14     15       16     17     18     19     20     21     22       23     24     25     26   27     28     29       30     04 April 2017      Sunday Monday Tuesday Wednesday Thursday Friday Saturday             1     2     3     4     5       6     7     8     9     10     11     12       13     14     15     16     17     18     19       20     21     22     23     24     25     26       27     28     29     30     31                            Your Oncology team    [phone]    Kathlen Brunswick MD  [place] RN/Claudia Nanetta Batty RN/Sue Vickki Muff RN  [employer] Secretary    FOLFIRINOX   Folfirinox  is the name for a combination of intravenous (IV) chemotherapies.  It includes Fluorouracil (5FU), Leucovorin, Irinotecan and Oxaliplatin   Given every 2 weeks.     Plan to be here for 5 1/2 hours for each treatment.     You will get some chemotherapy at our infusion center.  Then you will go home with a pump with fluorouracil (5FU that will infuse over 46 hours (almost 2 days).  You can not get pump wet so you cannot shower.  You may wash your hair in the sink and take sponge bath.   You will need blood work done before each treatment.   You will need a medi-port placed under your skin on your chest.  This is where you will be hooked up to an IV to received your chemotherapy.   You will also get a shot called Neulasta, on the day after the pump is taken down.  This medicine helps your body make white blood cells.    You may experience these side effects:   Fatigue   Nausea/vomiting  (Use the anti-nausea medications.  We do not want you to vomit at all.  If you have vomiting or nausea that is not well controlled, call us!)   Decreased blood counts   Red blood cells - if low, you may feel fatigue   White blood cells - if low, you may get an infection   Platelets - if low, it may take longer to stop bleeding   Hand and foot syndrome - be sure to moisture well, we recommend using a thick cream like Eucerin or bag balm   Numbness/tingling in your hands/feet (Neuropathy)  Cold sensitivity   Hair loss/ hair thinning   Allergic reaction   Taste changes   Constipation: call if no bowel movement for over 2 days   Diarrhea: call if more than 4-5 loose, watery stools in a day - SEE HANDOUT!     Mouth sores   Be careful of being out in the sun!  Use SPF of 30 or greater.  (See hand outs for more detailed list.)  Anti-Nausea Medications: Medications for home use.  The day of treatment the [facility] will give you your anti-nausea medications.    Ondansetron (Zofran) 8 mg tablet by mouth, three times a day, if needed   May cause contipation    Prochlorperazine (Compazine) 10 mg tablet by mouth, every 6 hours as needed for nausea    Do not wait until you are very nauseated to start the extra pills.  The minute you feel a little 'off', start taking the medication.    If these pills are not working on your nausea, please call [phone] so we can find something that works for you!

## 2017-01-22 NOTE — Progress Notes (Signed)
Patient de-accessed, not requiring further treatment today.

## 2017-01-22 NOTE — Patient Instructions (Signed)
November 2018      Sunday Monday Tuesday Wednesday Thursday Friday Saturday                       1     2     3       4     5     6     7     8     9    INJECTION   7:30 AM   (15 min.)   Abram, Erica, RN   [facility]    FOLLOW UP VISIT   8:30 AM   (30 min.)   Hellems, Guinevere A, PA   [facility]    CHEMOTHERAPY   9:30 AM   (300 min.)   Mulvaney, Megan C, RN   [facility] 10       11     12     13     14     15     16    CT ABD PELVIS WITH CONTRAST  11:30 AM   (20 min.)   RIS, RR CT2 (RRCT2)   [facility] [address] 17       18     19     20    INJECTION   9:30 AM   (15 min.)   CAN CTR, POD A   [facility]    FOLLOW UP VISIT  10:00 AM   (30 min.)   Tejani, Mohamed, MD   [facility] 21     22     23    CHEMOTHERAPY   9:00 AM   (300 min.)   CAN CTR, POD F   [facility] 24       25     26    FOLLOW UP VISIT   3:30 PM   (30 min.)   Galka, Eva, MD   [facility] 27    FOLLOW UP VISIT  10:00 AM   (30 min.)   Quill, Timothy, MD   [facility] 28     29     03 March 2017      Sunday Monday Tuesday Wednesday Thursday Friday Saturday                                 1       2     3     4     5     6     7     8       9     10     11     12     13     14     15       16     17     18     19     20     21     22       23     24     25     26   27     28     29       30     04 April 2017      Sunday Monday Tuesday Wednesday Thursday Friday Saturday             1     2     3     4     5       6     7     8     9     10     11     12       13     14     15     16     17     18     19       20     21     22     23     24     25     26       27     28     29     30     31                            Your Oncology team    [phone]    Kathlen Brunswick MD  [place] RN/Claudia Nanetta Batty RN/Sue Vickki Muff RN  [employer] Secretary    FOLFIRINOX   Folfirinox  is the name for a combination of intravenous (IV) chemotherapies.  It includes Fluorouracil (5FU), Leucovorin, Irinotecan and Oxaliplatin   Given every 2 weeks.     Plan to be here for 5 1/2 hours for each treatment.     You will get some chemotherapy at our infusion center.  Then you will go home with a pump with fluorouracil (5FU that will infuse over 46 hours (almost 2 days).  You can not get pump wet so you cannot shower.  You may wash your hair in the sink and take sponge bath.   You will need blood work done before each treatment.   You will need a medi-port placed under your skin on your chest.  This is where you will be hooked up to an IV to received your chemotherapy.   You will also get a shot called Neulasta, on the day after the pump is taken down.  This medicine helps your body make white blood cells.    You may experience these side effects:   Fatigue   Nausea/vomiting  (Use the anti-nausea medications.  We do not want you to vomit at all.  If you have vomiting or nausea that is not well controlled, call us!)   Decreased blood counts   Red blood cells - if low, you may feel fatigue   White blood cells - if low, you may get an infection   Platelets - if low, it may take longer to stop bleeding   Hand and foot syndrome - be sure to moisture well, we recommend using a thick cream like Eucerin or bag balm   Numbness/tingling in your hands/feet (Neuropathy)  Cold sensitivity   Hair loss/ hair thinning   Allergic reaction   Taste changes   Constipation: call if no bowel movement for over 2 days   Diarrhea: call if more than 4-5 loose, watery stools in a day - SEE HANDOUT!     Mouth sores   Be careful of being out in the sun!  Use SPF of 30 or greater.  (See hand outs for more detailed list.)    Anti-Nausea Medications: Medications for home use.  The day of treatment the [facility] will give you your anti-nausea medications.    Ondansetron (Zofran) 8 mg tablet by mouth, three times a day, if needed   May cause contipation    Prochlorperazine (Compazine) 10 mg tablet by mouth, every 6 hours as needed for nausea    Do not wait until you are very nauseated to start the extra pills.  The minute you feel a little 'off', start taking the medication.    If these pills are not working on your nausea, please call [phone] so we can find something that works for you!

## 2017-01-22 NOTE — Progress Notes (Signed)
Patient arrived to clinic in stable condition; scheduled for port access and lab collection. Single IVAD positive for blood return, saline locked, and capped. Patient labs sent for processing, remaining accessed pending further treatment. Sent to clinic for appointment.

## 2017-01-23 ENCOUNTER — Telehealth: Payer: Self-pay | Admitting: Hematology and Oncology

## 2017-01-23 MED ORDER — AMOXICILLIN-POT CLAVULANATE 875-125 MG PO TABS *I*
1.0000 | ORAL_TABLET | Freq: Two times a day (BID) | ORAL | 0 refills | Status: AC
Start: 2017-01-23 — End: 2017-02-02

## 2017-01-23 NOTE — Telephone Encounter (Signed)
Amanda Galloway is calling with complaints of an extremely sore, red and swollen cat scratch, on her right forearm that is oozing blood.  Cat scratches were addressed at her clinic visit yesterday, per Dr. Guadelupe Sabin note, but she states that this one in particular is significantly worse today.   She states that despite applying pressure, the oozing won't stop.  I encouraged her to apply an ice pack,  continue to hold pressure, and that I would call her back after discussing with the team.   She verbalized understanding.

## 2017-01-23 NOTE — Telephone Encounter (Signed)
Amanda Galloway is aware to start the antibiotic twice daily, and that it may cause diarrhea.   She will call over the holiday if the symptoms don't improve, or worsen.

## 2017-01-23 NOTE — Progress Notes (Signed)
MEDICAL ONCOLOGY PROGRESS NOTE    Reason for Evaluation: Follow-up    Diagnosis: Pancreatic adenocarcinoma    Stage: Locally advanced    Performance Status: ECOG PS 1-2    Diagnosis and Treatment History:  1. Over several months, patient noted worsening abdominal pain and weight loss.  Extensive work-up including EGD, colonoscopy and imaging were unrevealing.  After suggestive HIDA scan, she underwent cholecystectomy on September 06, 2016 but symptoms persisted.      2. MRCP on September 25, 2016 demonstrated 2x2cm soft tissue mass-like abnormality in celiac region, contiguous with medial margin of uncinate process of pancreas concerning for pancreatic lession vs lymphadenopathy.    3. EUS/FNA on September 27, 2016 demonstrated a mass in pancreas uncinate process and lymph node pressing celiac axis. FNA confirmed adenocarcinoma.    4. She was transferred to [facility] for additional management.  Abd/pelvis CT on September 30, 2016 revealed ill-defined prominent retroperitoneal soft tissue with central low attenuation suggestive of necrosis which abuts SMA and is adjacent to pancreatic uncinate process similar to prior MRI. Differential includes pancreatic neoplasm versus lymphadenopathy.  CT angio on September 29, 2016 showed no pulmonary embolism.  Nonspecific 3 mm nodules in right upper and middle lobe, recommend attention on follow-up imaging.      5. Diagnostic laparoscopy and port placement by Dr. Ron Agee on October 04, 2016 - no evidence of metastatic disease.    Current Treatment: FOLFIRINOX chemotherapy - Cycle 1 (in-house) on October 05, 2016.  Cycle 2 on October 19, 2016.  Cycle 3 on November 02, 2016.  Cycle 4 on November 16, 2016.  Cycle 5 on November 27, 2016. Cycle 6 on December 14, 2016.  Cycle 7 on December 28, 2016. Cycle 8 on January 11, 2017.    Interval History:   Has now completed 8 cycles of chemotherapy. Still with fatigue. Otherwise, tolerated ok overall. No significant nausea now. Eating fair. No issues with diarrhea.  Intermittent constipation. Was started on lovenox this past Friday for newly diagnosed PE which was incidentally noted on restaging imaging. Has had some issues with bleeding with cuts from her cat on her arms. In addition some bruising on abdomen at sites of injection. Otherwise no significant bleeding. No fevers or chills. No recent infections.  Weight is down and fluid retention less since decreasing dose of dexamethasone to 2 mg BID.     Past Medical/Surgical History:  1. Fibromyalgia  2. Hypothyroidism  3. S/p knee surgery  4. S/p hysterectomy  5. S/p shoulder surgery  6. S/p tonsillectomy    Current Medications:  Current Outpatient Prescriptions on File Prior to Visit   Medication Sig Dispense Refill    enoxaparin (LOVENOX) 80 mg/0.22mL injection Inject 1 Syringe (80 mg total) into the skin 2 times daily 60 Syringe 5    methadone (DOLOPHINE) 5 MG tablet Take 2 tablets (10 mg total) by mouth 2 times daily   Max daily dose: 20 mg And 15 mg at hs; total MDD 35 mg 210 tablet 0    famotidine (PEPCID) 20 MG tablet Take 1 tablet (20 mg total) by mouth 2 times daily 60 tablet 3    escitalopram (LEXAPRO) 20 MG tablet Take 1 tablet (20 mg total) by mouth daily 30 tablet 3    LORazepam (ATIVAN) 0.5 MG tablet Take 1 tablet (0.5 mg total) by mouth 2 times daily as needed for Anxiety   Max daily dose: 1 mg 60 tablet 0    amitriptyline (ELAVIL) 25 MG tablet  Take 1 tablet (25 mg total) by mouth nightly 30 tablet 5    dexamethasone (DECADRON) 4 MG tablet Take 1 tablet (4 mg total) by mouth 2 times daily (Patient taking differently: Take 2 mg by mouth 2 times daily   ) 60 tablet 5    docusate sodium (COLACE) 100 MG capsule Take 2 capsules (200 mg total) by mouth 2 times daily 120 capsule 5    metoclopramide (REGLAN) 5 MG tablet Take 1 tablet (5 mg total) by mouth 3 times daily (before meals) 90 tablet 5    pantoprazole (PROTONIX) 40 MG EC tablet Take 1 tablet (40 mg total) by mouth 2 times daily (before meals)    Swallow whole. Do not crush, break, or chew. 60 tablet 5    senna (SENOKOT) 8.6 MG tablet Take 2 tablets by mouth 2 times daily 120 tablet 5    PREMARIN 0.625 MG tablet       ondansetron (ZOFRAN-ODT) 4 MG disintegrating tablet Take 1 tablet (4 mg total) by mouth 3 times daily as needed for Nausea   Place on top of tongue. 60 tablet 2    levothyroxine (SYNTHROID, LEVOTHROID) 137 MCG tablet Take 1 tablet (137 mcg total) by mouth daily (before breakfast) 30 tablet 0    Vitamins - senior (CENTRUM SILVER) TABS Take 1 tablet by mouth daily 30 tablet 0    triamterene-hydrochlorothiazide (MAXZIDE-25) 37.5-25 MG per tablet Take 1 tablet by mouth daily 30 tablet 0    fluorouracil (ADRUCIL) 50 MG/ML injection Administer 3,477 mg into the vein every 14 days   Start in infusion center.  Infuse over 46 hours at home. 3477 mg 2    pegfilgrastim (NEULASTA) 6 MG/0.6ML injection syringe Inject 0.6 mLs (6 mg total) into the skin once 0.6 mL 3    Meds Exist in Therapy Plan Peg-Filgrastim      heparin 100 UNIT/ML injection 5 mLs (500 Units total) by Intracatheter route as needed (for line care) 30 Syringe 5    sodium chloride 0.9 % flush 10 mLs by Intracatheter route as needed (for line care) 600 mL 5     No current facility-administered medications on file prior to visit.        Social History: Divorced.  Worked in [employer].  Daughter [name]) worked on [employer] and now in home care.    Family History: Mother with breast cancer.  Father with cancer of unknown type.    Review of Systems: As per Interval History, remainder of 12-point review of systems otherwise negative    Physical Exam:  Vitals:    01/22/17 1000   BP: 120/77   Pulse: 89   Resp: 18   Temp: 36.6 C (97.9 F)   Weight: 70.7 kg (155 lb 14.4 oz)   Height: 165.1 cm (5\' 5" )   General: Slightly tired appearing, well nourished, NAD  HEENT: Conjunctivae normal, sclera anicteric, MMM. No oral sores  Neck: Supple  Cardiac: RRR. No m/r/g  Lungs: CTAB. No  wheezes or rhonchi  Abdomen: Bruises over abdomen. BS+, soft, ND, NT, no hepatosplenomegaly, no palpable masses  Extremities: No LE edema, some bandages on arms bilaterally from cat scratches which were bleeding  Neuro: AOx3. Pupils equal and reactive to light. Speech normal. No focal weakness appreciated  Skin: No rashes, ecchymosis or petechiae appreciated  Lymph: No cervical, submandibular, supraclavicular, or axillary lymphadenopathy     Laboratory Review: CBC and CMP from January 22, 2017 reviewed    Pathology Review:  As outlined above    Radiology Review:   1. Chest CT on 01/18/17   1. Filling defect identified in a segmental/subsegmental right lower lobe pulmonary artery consistent with an embolus.   2. Small scattered pulmonary nodules noted, measuring 2 mm in the right middle lobe and in the left lower lobe measuring 2 mm, not significantly changed when compared with prior imaging. Continued attention on follow-up imaging recommended.   3. No clearly enlarged lymph nodes by CT size criteria.      Findings discussed with Gena Fray via phone at 3:05 pm on 01/18/2017.      CT abdomen/pelvis 01/18/17  Stable treated Pancreatic carcinoma (uncinate process). Heterogeneous uncinate process without discrete measurable tumor. Resolution of original regional lymphadenopathy. Small amount of residual soft tissue extending to the SMA. No new metastases.      Altogether stable compared to September 2018 and is improved (partial response) compared to July 2018.       Assessment and Plan: 71 yo F with locally advanced pancreatic adenocarcinoma.  She is currently on 1st line FOLFIRINOX chemotherapy (with Neulasta support).      Completed Cycle 8 of FOLFIRINOX on 01/11/17. Restaging scans show stable disease from September and a good partial response overall. No new areas of disease. No metastatic disease.     She will be meeting with Dr. Ron Agee on 01/28/17 to discuss surgical resection. We will also present her case  at multidisciplinary tumor board. We did discuss that sometimes radiation is required prior to consideration of surgery as well but we will await Dr. Carolin Coy impression. Overall we emphasized for her how well she has done to get through her treatment.     She will also be meeting with palliative care next week to discuss how to begin to taper down on some supportive medications as she gets further from chemotherapy and hopefully starts to feel a bit better.    In regards to her PE, will continue on Lovenox. Will try to avoid scratches as able.     All of their questions were answered to apparent satisfaction. They know to call if any further questions or concerns arise.     Follow up after decision regarding next steps for treatment.     Pt seen and discussed with Dr. Sharlyn Bologna.     Rober Minion, MD  Medical Oncology Fellow

## 2017-01-23 NOTE — Progress Notes (Signed)
Patient seen and discussed with fellow.  Agree with his assessment and plan.   71 yo F with locally advanced pancreatic adenocarcinoma.  She is s/p 8 cycles of FOLFIRINOX chemotherapy which she has tolerated well.  Restaging scan continues to show good improvement from baseline and she is pleased with this.      She will meet with Dr. Ron Agee next to discuss next steps and we will also plan to review her case at our multi-disciplinary conference on Friday, Nov 30.  Patient is very hopeful for resection now but we explained that SBRT may be necessary first.      Mohamedtaki A. Sharlyn Bologna, M.D.  Medical Oncology

## 2017-01-25 ENCOUNTER — Inpatient Hospital Stay: Payer: PRIVATE HEALTH INSURANCE

## 2017-01-28 ENCOUNTER — Ambulatory Visit: Payer: PRIVATE HEALTH INSURANCE | Admitting: Surgical Oncology

## 2017-01-28 ENCOUNTER — Other Ambulatory Visit
Admission: RE | Admit: 2017-01-28 | Discharge: 2017-01-28 | Disposition: A | Discharge status: Home / Self Care | Payer: PRIVATE HEALTH INSURANCE | Source: Ambulatory Visit | Attending: Hematology and Oncology | Admitting: Hematology and Oncology

## 2017-01-28 ENCOUNTER — Encounter: Payer: Self-pay | Admitting: Surgical Oncology

## 2017-01-28 ENCOUNTER — Telehealth: Payer: Self-pay | Admitting: Hematology and Oncology

## 2017-01-28 VITALS — BP 108/55 | HR 90 | Temp 98.2°F | Resp 17 | Ht 65.0 in | Wt 160.9 lb

## 2017-01-28 DIAGNOSIS — C259 Malignant neoplasm of pancreas, unspecified: Secondary | ICD-10-CM | POA: Insufficient documentation

## 2017-01-28 DIAGNOSIS — W5503XA Scratched by cat, initial encounter: Secondary | ICD-10-CM

## 2017-01-28 DIAGNOSIS — M549 Dorsalgia, unspecified: Secondary | ICD-10-CM

## 2017-01-28 DIAGNOSIS — I2699 Other pulmonary embolism without acute cor pulmonale: Secondary | ICD-10-CM

## 2017-01-28 DIAGNOSIS — S50819A Abrasion of unspecified forearm, initial encounter: Secondary | ICD-10-CM

## 2017-01-28 DIAGNOSIS — C250 Malignant neoplasm of head of pancreas: Secondary | ICD-10-CM

## 2017-01-28 LAB — CBC AND DIFFERENTIAL
Baso # K/uL: 0.1 10*3/uL (ref 0.0–0.1)
Basophil %: 0.5 %
Eos # K/uL: 0.1 10*3/uL (ref 0.0–0.4)
Eosinophil %: 0.6 %
Hematocrit: 28 % — ABNORMAL LOW (ref 34–45)
Hemoglobin: 9 g/dL — ABNORMAL LOW (ref 11.2–15.7)
IMM Granulocytes #: 1 10*3/uL — ABNORMAL HIGH (ref 0.0–0.1)
IMM Granulocytes: 4.6 %
Lymph # K/uL: 1.8 10*3/uL (ref 1.2–3.7)
Lymphocyte %: 8.3 %
MCH: 35 pg/cell — ABNORMAL HIGH (ref 26–32)
MCHC: 33 g/dL (ref 32–36)
MCV: 107 fL — ABNORMAL HIGH (ref 79–95)
Mono # K/uL: 0.9 10*3/uL (ref 0.2–0.9)
Monocyte %: 3.9 %
Neut # K/uL: 18 10*3/uL — ABNORMAL HIGH (ref 1.6–6.1)
Nucl RBC # K/uL: 0.1 10*3/uL — ABNORMAL HIGH (ref 0.0–0.0)
Nucl RBC %: 0.4 /100 WBC — ABNORMAL HIGH (ref 0.0–0.2)
Platelets: 189 10*3/uL (ref 160–370)
RBC: 2.6 MIL/uL — ABNORMAL LOW (ref 3.9–5.2)
RDW: 18.4 % — ABNORMAL HIGH (ref 11.7–14.4)
Seg Neut %: 82.1 %
WBC: 21.9 10*3/uL — ABNORMAL HIGH (ref 4.0–10.0)

## 2017-01-28 LAB — COMPREHENSIVE METABOLIC PANEL
ALT: 87 U/L — ABNORMAL HIGH (ref 0–35)
AST: 52 U/L — ABNORMAL HIGH (ref 0–35)
Albumin: 4.1 g/dL (ref 3.5–5.2)
Alk Phos: 171 U/L — ABNORMAL HIGH (ref 35–105)
Anion Gap: 12 (ref 7–16)
Bilirubin,Total: 0.2 mg/dL (ref 0.0–1.2)
CO2: 30 mmol/L — ABNORMAL HIGH (ref 20–28)
Calcium: 8.8 mg/dL (ref 8.6–10.2)
Chloride: 98 mmol/L (ref 96–108)
Creatinine: 0.88 mg/dL (ref 0.51–0.95)
GFR,Black: 77 *
GFR,Caucasian: 67 *
Glucose: 106 mg/dL — ABNORMAL HIGH (ref 60–99)
Lab: 23 mg/dL — ABNORMAL HIGH (ref 6–20)
Potassium: 4.4 mmol/L (ref 3.3–5.1)
Sodium: 140 mmol/L (ref 133–145)
Total Protein: 6.6 g/dL (ref 6.3–7.7)

## 2017-01-28 LAB — NEUTROPHIL #-INSTRUMENT: Neutrophil #-Instrument: 18 10*3/uL

## 2017-01-28 LAB — CA 19 9 (EFF. 01-2011): CA 19 9 (eff. 01-2011): 37 U/mL — ABNORMAL HIGH (ref 0–35)

## 2017-01-28 MED ORDER — ENOXAPARIN SODIUM 100 MG/ML IJ SOSY *I*
100.0000 mg | PREFILLED_SYRINGE | Freq: Every day | INTRAMUSCULAR | 5 refills | Status: DC
Start: 2017-01-28 — End: 2017-02-01

## 2017-01-28 MED ORDER — SULFAMETHOXAZOLE-TRIMETHOPRIM 800-160 MG PO TABS *I*
1.0000 | ORAL_TABLET | Freq: Two times a day (BID) | ORAL | 0 refills | Status: AC
Start: 2017-01-28 — End: 2017-02-07

## 2017-01-28 NOTE — Progress Notes (Deleted)
HPB-GI Surgery Follow up note    Chief Complaint  Amanda Galloway is a 71 y.o. female who presents  on 01/28/2017 for HPB-GI follow-up care  .   Diagnosis  Locally advanced pancreatic cancer    Physician Team  Surgeon  Delano Metz, MD  PCP Celesta Aver, MD  Gastroenterologist   Medical Oncology: Calton Dach, MD     Interval History  Was doing extremely well until 8 days ago  Started on Lovenox for PE found on restaging imaging  Scratches from previous interaction with new kitten began to bleeding.  Continuous oozing from wounds since that time.  Now on Augmentin for associated cellulitis  Now with weakness, fatigue and SOB.  No abdominal pain, bloating or cramping  Appetite good.  Weight stable    History of the Present Illness   . Over several months, patient noted worsening abdominal pain and weight loss.  Extensive work-up including EGD, colonoscopy and imaging were unrevealing.  After suggestive HIDA scan, she underwent cholecystectomy on September 06, 2016 but symptoms persisted.       MRCP on September 25, 2016 demonstrated2x2cm soft tissue mass-like abnormality in celiac region, contiguous with medial margin of uncinate process of pancreas concerning for pancreatic lession vs lymphadenopathy.     . EUS/FNA on September 27, 2016 demonstrated a mass in pancreas uncinate process and lymph node pressing celiac axis. FNA confirmed adenocarcinoma.      She was transferred to [facility] ([place]) for additional management.  Abd/pelvis CT on September 30, 2016 revealed ill-defined prominent retroperitoneal soft tissue with central low attenuation suggestive of necrosis which abuts SMA and is adjacent to pancreatic uncinate process similar to prior MRI. Differential includes pancreatic neoplasm versus lymphadenopathy.  CT angio on September 29, 2016 showed no pulmonary embolism.  Nonspecific 3 mm nodules in right upper and middle lobe, recommend attention on follow-up imaging.       . Diagnostic laparoscopy and port placement by Dr. Ron Agee on October 04, 2016 - no evidence of metastatic disease.     FOLFIRINOX chemotherapy - treatment start date 10/05/2016.  Completed 8 cycles  Medical History  Past Medical History:   Diagnosis Date    Cancer     Fibromyalgia     Hypothyroidism      Surgical History  Past Surgical History:   Procedure Laterality Date    CHOLECYSTECTOMY, LAPAROSCOPIC  09/06/2016    HYSTERECTOMY  08/1986    Fibroids    KNEE SURGERY Right     PR INSERT TUNNELED CV CATH WITH PORT Right 10/04/2016    Procedure: Right IJ MEDIPORT Insertion;  Surgeon: Delano Metz, MD;  Location[facility] MAIN OR;  Service: Oncology General    PR LAP,DIAGNOSTIC ABDOMEN N/A 10/04/2016    Procedure: LAPAROSCOPY DIAGNOSTIC;  Surgeon: Delano Metz, MD;  Location: [facility] MAIN OR;  Service: Oncology General    ROTATOR CUFF REPAIR Right     TONSILLECTOMY AND ADENOIDECTOMY       Family History  Family History   Problem Relation Age of Onset    Breast cancer Mother         died 81    Cancer Father         NHL, died 76    Heart Disease Father     Diabetes Sister     Obesity Sister         4 siblings     Social History  Social History     Social History    Marital status:  Divorced     Spouse name: N/A    Number of children: N/A    Years of education: N/A     Occupational History    Not on file.     Social History Main Topics    Smoking status: Never Smoker    Smokeless tobacco: Never Used    Alcohol use No    Drug use: No    Sexual activity: Not on file     Social History Narrative    No narrative on file       Medications  Outpatient Encounter Prescriptions as of 01/28/2017   Medication Sig Dispense Refill    amoxicillin-clavulanate (AUGMENTIN) 875-125 MG per tablet Take 1 tablet by mouth 2 times daily for 10 days 20 tablet 0    enoxaparin (LOVENOX) 80 mg/0.46mL injection Inject 1 Syringe (80 mg total) into the skin 2 times daily 60 Syringe 5    methadone (DOLOPHINE) 5 MG tablet Take 2 tablets (10 mg total) by mouth 2 times daily   Max daily dose: 20 mg And 15 mg at hs;  total MDD 35 mg 210 tablet 0    fluorouracil (ADRUCIL) 50 MG/ML injection Administer 3,477 mg into the vein every 14 days   Start in infusion center.  Infuse over 46 hours at home. 3477 mg 2    pegfilgrastim (NEULASTA) 6 MG/0.6ML injection syringe Inject 0.6 mLs (6 mg total) into the skin once 0.6 mL 3    famotidine (PEPCID) 20 MG tablet Take 1 tablet (20 mg total) by mouth 2 times daily 60 tablet 3    escitalopram (LEXAPRO) 20 MG tablet Take 1 tablet (20 mg total) by mouth daily 30 tablet 3    LORazepam (ATIVAN) 0.5 MG tablet Take 1 tablet (0.5 mg total) by mouth 2 times daily as needed for Anxiety   Max daily dose: 1 mg 60 tablet 0    amitriptyline (ELAVIL) 25 MG tablet Take 1 tablet (25 mg total) by mouth nightly 30 tablet 5    dexamethasone (DECADRON) 4 MG tablet Take 1 tablet (4 mg total) by mouth 2 times daily (Patient taking differently: Take 2 mg by mouth 2 times daily   ) 60 tablet 5    docusate sodium (COLACE) 100 MG capsule Take 2 capsules (200 mg total) by mouth 2 times daily 120 capsule 5    metoclopramide (REGLAN) 5 MG tablet Take 1 tablet (5 mg total) by mouth 3 times daily (before meals) 90 tablet 5    pantoprazole (PROTONIX) 40 MG EC tablet Take 1 tablet (40 mg total) by mouth 2 times daily (before meals)   Swallow whole. Do not crush, break, or chew. 60 tablet 5    senna (SENOKOT) 8.6 MG tablet Take 2 tablets by mouth 2 times daily 120 tablet 5    Meds Exist in Therapy Plan Peg-Filgrastim      heparin 100 UNIT/ML injection 5 mLs (500 Units total) by Intracatheter route as needed (for line care) 30 Syringe 5    sodium chloride 0.9 % flush 10 mLs by Intracatheter route as needed (for line care) 600 mL 5    PREMARIN 0.625 MG tablet       ondansetron (ZOFRAN-ODT) 4 MG disintegrating tablet Take 1 tablet (4 mg total) by mouth 3 times daily as needed for Nausea   Place on top of tongue. 60 tablet 2    levothyroxine (SYNTHROID, LEVOTHROID) 137 MCG tablet Take 1 tablet (137 mcg total) by  mouth daily (  before breakfast) 30 tablet 0    Vitamins - senior (CENTRUM SILVER) TABS Take 1 tablet by mouth daily 30 tablet 0    triamterene-hydrochlorothiazide (MAXZIDE-25) 37.5-25 MG per tablet Take 1 tablet by mouth daily 30 tablet 0     No facility-administered encounter medications on file as of 01/28/2017.      Allergies  No Known Allergies (drug, envir, food or latex)  Review of Systems  Review of Systems   Constitutional: Positive for malaise/fatigue. Negative for chills, fever and weight loss.   HENT: Negative for congestion and sore throat.    Eyes: Negative for blurred vision and redness.   Respiratory: Positive for shortness of breath. Negative for cough, hemoptysis and sputum production.    Cardiovascular: Negative for chest pain, palpitations and leg swelling.   Gastrointestinal: Negative for abdominal pain, blood in stool, constipation, diarrhea, heartburn, melena, nausea and vomiting.   Genitourinary: Negative for dysuria, flank pain and frequency.   Musculoskeletal: Negative for back pain, joint pain and myalgias.   Skin: Negative for itching and rash.        Constant oozing of blood from previous cat scratches on bilateral forearms since starting Lovenox    Neurological: Negative for dizziness, focal weakness, seizures and headaches.   Endo/Heme/Allergies: Bruises/bleeds easily.   Psychiatric/Behavioral: Negative for depression. The patient is nervous/anxious.      Physical Exam  BP 108/55 (BP Location: Left arm, Patient Position: Sitting, Cuff Size: adult)    Pulse 90    Temp 36.8 C (98.2 F) (Temporal)    Resp 17    Ht 165.1 cm (5\' 5" )    Wt 73 kg (160 lb 15 oz)    SpO2 96%    BMI 26.78 kg/m   Physical Exam   Constitutional: She is oriented to person, place, and time. She appears well-developed and well-nourished. No distress.   HENT:   Head: Normocephalic and atraumatic.   Mouth/Throat: Oropharynx is clear and moist.   Eyes: Pupils are equal, round, and reactive to light. Conjunctivae and  EOM are normal. No scleral icterus.   Neck: Normal range of motion. Neck supple. No JVD present. No thyromegaly present.   Cardiovascular: Normal rate, regular rhythm, normal heart sounds and intact distal pulses.    No murmur heard.  Pulmonary/Chest: Effort normal and breath sounds normal. No respiratory distress. She exhibits no tenderness.   Abdominal: Soft. Bowel sounds are normal. She exhibits no distension and no mass. There is no tenderness. There is no rebound and no guarding.   Musculoskeletal: Normal range of motion. She exhibits no edema or tenderness.   Lymphadenopathy:     She has no cervical adenopathy.   Neurological: She is alert and oriented to person, place, and time. She has normal reflexes. No cranial nerve deficit.   Skin: Skin is warm and dry. No rash noted. No erythema. No pallor.        Scratches to bilateral forearms. No erythema,  Active oozing of blood from areas.  No evidence of infection    Psychiatric: She has a normal mood and affect. Her behavior is normal. Judgment and thought content normal.     Laboratory Data  Component      Latest Ref Rng & Units 01/22/2017 01/11/2017 12/28/2016 11/16/2016   CA 19 9 (eff. 03-2009)      0 - 35 U/mL 44 (H) 45 (H) 48 (H) 111 (H)     Component      Latest Ref Rng & Units  10/16/2016   CA 19 9 (eff. 03-2009)      0 - 35 U/mL 474 (H)     Pathology  Surgical Pathology [phone]   Additional Copy to:   [place]     FINAL DIAGNOSIS:   Outside material received from [facility], [place],   [place] for consultation.     Cytology Consultation:   Pancreas, head, mass, fine needle aspiration ([id number], 09/27/2016):     Atypical cells suspicious for adenocarcinoma. See comment.     Comment: Limited cells of interest are present for evaluation. Clinical   correlation is recommended.              Imaging Data  01/18/2017 12:35 PM    CT CHEST WITH IV CONTRAST  IMPRESSION:    1.  Filling defect identified in a  segmental/subsegmental right lower lobe pulmonary artery consistent with an embolus.  2.  Small scattered pulmonary nodules noted, measuring 2 mm in the right middle lobe and in the left lower lobe measuring 2 mm, not significantly changed when compared with prior imaging. Continued attention on follow-up imaging recommended.  3.  No clearly enlarged lymph nodes by CT size criteria.    Findings discussed with Amanda Galloway via phone at 3:05 pm on 01/18/2017.     Please see the separately dictated CT abdomen and pelvis imaging report for detailed findings below the level the diaphragm.      END OF IMPRESSION    01/18/2017 12:35 PM    CT ABDOMEN AND PELVIS WITH IV CONTRAST  IMPRESSION:    Stable treated Pancreatic carcinoma (uncinate process). Heterogeneous uncinate process without discrete measurable tumor. Resolution of original regional lymphadenopathy. Small amount of residual soft tissue extending to the SMA. No new metastases.    Altogether stable compared to September 2018 and is improved (partial response) compared to July 2018.      END OF IMPRESSION    Assessment  71 year old female with borderline resectable pancreatic cancer and ongoing abdominal pain. She completed Cycle 8 of FOLFIRINOX on 01/11/17. Feels fatigued and minor anorexia.  Restaging scans show good partial response overall and stable disease from September. No new areas of disease or suggestion of metastasis. Her CA 19-9 declined from 384 to 37. Chest Imaging showed pulmonary emboli and she was started on anticoagulation. She was asymptomatic at the time but now complains of some shortness of breath. No leg swelling.     Recent new kitten and has several scratches to the arm with bleeding and erythema. Lab indicates leukocytosis. Started on antibiotics.     Ongoing abdominal pain which is being treated with methadone.     Will discuss at Multidisciplinary GI Tumor Conference later this week. May consider radiation as we wait for her to  recuperate from chemotherapy.     Plan   Radiation Oncology consultation for preoperative radiation    Tentatively schedule for exploratory laparotomy, [place], possible electroporation in first week of January 2019   Wound care instructions reviewed.  Management of Lovenox per Oncology team    Will treat prophylactically for cat scratch disease w/ 7 day course of Bactrim    Follow up 6 weeks    Amanda Baumgartner, NP    HPB-GI Attending Addendum:       I saw, examined and evaluated the patient, reviewed the patient chart and the relevant images and lab findings. Amanda Garner, NP has acted as a Education administrator for this encounter  at this office visit.     I formulated the assessment plan and directed the care of this patient. My detailed recommendations and opinion are documented in the above edited note which reflect the management plans. The patient is agreeable with this plan.    Delano Metz, MD, FACS  Assistant Professor of Surgery  Division of Hepatobiliary, Pancreas, & GI Surgery          Delano Metz, MD, FACS  Hepatobiliary, Pancreas & GI Surgery

## 2017-01-28 NOTE — Telephone Encounter (Signed)
Agree with stopping Lovenox for 24 hours.      And then she can resume at 1.5mg /kg q24hr dosing (rather than her current 1mg /kg Q12hr dosing).  This may help reduce the easy bleeding.  I would recommend Lovenox 100mg  q24hrs (slightly lower than calculated) to resume for her.    Thanks

## 2017-01-28 NOTE — Telephone Encounter (Signed)
Spoke with Amanda Galloway, let her know that Dr. Sharlyn Bologna is ok if Leylanie holds her Lovenox for 24 hours to see if this helps with her bleeding. We are also going to lower her Lovenox prescription to 100 mg daily. Prescription has been sent to [facility]. [name] verbalized understanding and will call back if she has any questions.

## 2017-01-28 NOTE — Telephone Encounter (Signed)
Spoke with [name], she called to ask about decreasing Lovenox dose due to continued bleeding from cat scratches. They have been placing 2 x 2, and 4 x 4 guaze pads to scratches and they are saturated. She has been changing dressings 3x day. Advised that she could stop and have labs drawn while here for Dr. Ron Agee visit, let her know that per Guin Hellems her Lovenox is at therapeutic dosing and cannot be lowered any more for it to be effective towards Kalifa's blood clots. Please try to make sure that she apply pressure dressings to cat scratch sites. [name] verbalized understanding.

## 2017-01-29 ENCOUNTER — Ambulatory Visit: Payer: PRIVATE HEALTH INSURANCE | Admitting: Oncology

## 2017-01-29 ENCOUNTER — Ambulatory Visit: Payer: PRIVATE HEALTH INSURANCE | Admitting: Hospice and Palliative Medicine

## 2017-01-29 ENCOUNTER — Encounter: Payer: Self-pay | Admitting: Hospice and Palliative Medicine

## 2017-01-29 VITALS — BP 102/51 | HR 86 | Temp 97.5°F | Resp 18 | Ht 65.67 in | Wt 162.2 lb

## 2017-01-29 VITALS — BP 102/51 | HR 86 | Ht 65.67 in | Wt 162.3 lb

## 2017-01-29 DIAGNOSIS — R109 Unspecified abdominal pain: Secondary | ICD-10-CM

## 2017-01-29 DIAGNOSIS — R531 Weakness: Secondary | ICD-10-CM

## 2017-01-29 DIAGNOSIS — I2699 Other pulmonary embolism without acute cor pulmonale: Secondary | ICD-10-CM

## 2017-01-29 DIAGNOSIS — S50819A Abrasion of unspecified forearm, initial encounter: Secondary | ICD-10-CM

## 2017-01-29 DIAGNOSIS — F419 Anxiety disorder, unspecified: Secondary | ICD-10-CM

## 2017-01-29 DIAGNOSIS — C259 Malignant neoplasm of pancreas, unspecified: Secondary | ICD-10-CM

## 2017-01-29 DIAGNOSIS — R233 Spontaneous ecchymoses: Secondary | ICD-10-CM

## 2017-01-29 DIAGNOSIS — W5503XA Scratched by cat, initial encounter: Secondary | ICD-10-CM

## 2017-01-29 MED ORDER — METHADONE HCL 5 MG PO TABS *I*
10.0000 mg | ORAL_TABLET | Freq: Two times a day (BID) | ORAL | 0 refills | Status: DC
Start: 2017-01-29 — End: 2017-02-27

## 2017-01-29 NOTE — Patient Instructions (Signed)
November 2018      Sunday Monday Tuesday Wednesday Thursday Friday Saturday                       1     2     3       4     5     6     7     8     9    INJECTION   7:30 AM   (15 min.)   Abram, Erica, RN   [facility]    FOLLOW UP VISIT   8:30 AM   (30 min.)   Hellems, Guinevere A, PA   [facility]    CHEMOTHERAPY   9:30 AM   (300 min.)   Mulvaney, Megan C, RN   [facility] 10       11     12     13     14     15     16    CT ABD PELVIS WITH CONTRAST  11:30 AM   (20 min.)   RIS, RR CT2 (RRCT2)   [facility] [address] 17       18     19     20    INJECTION   9:30 AM   (15 min.)   Gawronski, Meagan, RN   [facility]    FOLLOW UP VISIT  10:00 AM   (30 min.)   Tejani, Mohamed, MD   [facility] 21     22     23     24       25     26    Outpatient Visit   3:16 PM   [facility]    FOLLOW UP VISIT   3:30 PM   (30 min.)   Galka, Eva, MD   [facility] 27    FOLLOW UP VISIT  10:00 AM   (30 min.)   Quill, Timothy, MD    Palliative Care Clinic    FOLLOW UP VISIT  11:30 AM   (15 min.)   Hellems, Guinevere A, PA   [facility] 28     29     30    INJECTION  11:30 AM   (15 min.)   [facility], INJECTION POD   [facility]                  December 2018      Sunday Monday Tuesday Wednesday Thursday Friday Saturday                                 1       2     3     4     5     6     7     8       9     10     11     12     13     14     15       16     17     18     19   20     21     22       23     24     25     26     27     28     29       30     04 April 2017      Sunday Monday Tuesday Wednesday Thursday Friday Saturday             1     2     3     4     5       6     7     8     9     10     11     12       13     14     15     16     17     18     19       20     21     22      23     24     25     26       27     28     29     30     31                          Your Oncology team    [phone]    Kathlen Brunswick MD  [facility] PA  Elicia Lamp RN/Claudia Nanetta Batty RN/Sue Vickki Muff RN  [place]

## 2017-01-29 NOTE — Progress Notes (Signed)
Palliative Care Outpatient Progress Note    Subjective: Amanda Galloway was seen in palliative care follow-up.  She has had some major problems with bleeding since she was started on anticoagulation with Lovenox.  She bled into her abdomen and back and thighs.  She also has multiple scratches over her arms from a cat that it also bled extensively.  Her hematocrit is falling to 27.  She has been receiving chemotherapy for pancreatic cancer and her tumor markers have been improving otherwise, but this new clotting tendency is a serious development.  She remains hopeful that she will be able to get surgery after the new year.  she is quite apprehensive about what is going on but is still eager to move forward with treatment.     Palliative Care ROS:  Pain   Mild  Anxiety   Moderate  Depression   Mild  Tiredness/Fatigue   Moderate  Constipation   no  Unable to Respond   no    Physical Exam:  @VITALSRANGE @  General appearance: appears stated age, cooperative and mild distress  Lungs: clear to auscultation bilaterally  Heart: regular rate and rhythm, S1, S2 normal, no murmur, click, rub or gallop  Abdomen: Multiple ecchymoses that are very large on her abdomen.  One is in her epigastrium and the other is in the suprapubic area.  She also has an ecchymosis on her back in the lower area and on her thigh  mental status she is clear thinking but anxious    her hematocrit is 28    Assessment/Plan: overall, Amanda Galloway had substantial bleeding well on Lovenox.  Is not at all clear to me with the extent of her ecchymoses that she should go back on this assignment to have her go see Dr. Sharlyn Bologna this morning before restarting.  I did catch up with Dr. Sharlyn Bologna so he is aware of the problem.  She may well need to have a filter put in instead of going on anticoagulants.  She also probably needs to either have her cat declotted or have the cat temporarily go to another place to stay given her risk for infection and for bleeding from the cat  scratches.  she is getting  frightened by these complications and more anxious about the overall situation she is in.  Her daughter is still eager and enthusiastic for her to move forward with treatment as is the patient, and she still has some positive markers for a good response, but the coagulation issues are certainly a sign that her situation and prognosis is more complex. she is going to stay on her methadone for now because of the pain from the ecchymoses in part, but this might  be able to be tapered in the future as he is ecchymoses resolve as it seems like it's not related to her cancer as much anymore.      Total Time Spent 73minutes:   >50% of time was spent in counseling and/or coordination of care. 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    Malignant neoplasm of pancreas C25.9       No Known Allergies (drug, envir, food or latex)    Current Outpatient Prescriptions on File Prior to Visit   Medication Sig Dispense Refill    enoxaparin (LOVENOX) 100 mg/mL injection Inject 1 Syringe (100 mg total) into the skin daily 30 Syringe 5    sulfamethoxazole-trimethoprim (BACTRIM DS,SEPTRA DS) 800-160 MG per tablet Take 1  tablet by mouth 2 times daily for 10 days 20 tablet 0    amoxicillin-clavulanate (AUGMENTIN) 875-125 MG per tablet Take 1 tablet by mouth 2 times daily for 10 days 20 tablet 0    methadone (DOLOPHINE) 5 MG tablet Take 2 tablets (10 mg total) by mouth 2 times daily   Max daily dose: 20 mg And 15 mg at hs; total MDD 35 mg 210 tablet 0    fluorouracil (ADRUCIL) 50 MG/ML injection Administer 3,477 mg into the vein every 14 days   Start in infusion center.  Infuse over 46 hours at home. 3477 mg 2    pegfilgrastim (NEULASTA) 6 MG/0.6ML injection syringe Inject 0.6 mLs (6 mg total) into the skin once 0.6 mL 3    famotidine (PEPCID) 20 MG tablet Take 1 tablet (20 mg total) by mouth 2  times daily 60 tablet 3    escitalopram (LEXAPRO) 20 MG tablet Take 1 tablet (20 mg total) by mouth daily 30 tablet 3    LORazepam (ATIVAN) 0.5 MG tablet Take 1 tablet (0.5 mg total) by mouth 2 times daily as needed for Anxiety   Max daily dose: 1 mg 60 tablet 0    amitriptyline (ELAVIL) 25 MG tablet Take 1 tablet (25 mg total) by mouth nightly 30 tablet 5    dexamethasone (DECADRON) 4 MG tablet Take 1 tablet (4 mg total) by mouth 2 times daily (Patient taking differently: Take 2 mg by mouth 2 times daily   ) 60 tablet 5    docusate sodium (COLACE) 100 MG capsule Take 2 capsules (200 mg total) by mouth 2 times daily 120 capsule 5    metoclopramide (REGLAN) 5 MG tablet Take 1 tablet (5 mg total) by mouth 3 times daily (before meals) 90 tablet 5    pantoprazole (PROTONIX) 40 MG EC tablet Take 1 tablet (40 mg total) by mouth 2 times daily (before meals)   Swallow whole. Do not crush, break, or chew. 60 tablet 5    senna (SENOKOT) 8.6 MG tablet Take 2 tablets by mouth 2 times daily 120 tablet 5    Meds Exist in Therapy Plan Peg-Filgrastim      heparin 100 UNIT/ML injection 5 mLs (500 Units total) by Intracatheter route as needed (for line care) 30 Syringe 5    sodium chloride 0.9 % flush 10 mLs by Intracatheter route as needed (for line care) 600 mL 5    PREMARIN 0.625 MG tablet       ondansetron (ZOFRAN-ODT) 4 MG disintegrating tablet Take 1 tablet (4 mg total) by mouth 3 times daily as needed for Nausea   Place on top of tongue. 60 tablet 2    levothyroxine (SYNTHROID, LEVOTHROID) 137 MCG tablet Take 1 tablet (137 mcg total) by mouth daily (before breakfast) 30 tablet 0    Vitamins - senior (CENTRUM SILVER) TABS Take 1 tablet by mouth daily 30 tablet 0    triamterene-hydrochlorothiazide (MAXZIDE-25) 37.5-25 MG per tablet Take 1 tablet by mouth daily 30 tablet 0     No current facility-administered medications on file prior to visit.

## 2017-01-29 NOTE — Progress Notes (Signed)
MEDICAL ONCOLOGY PROGRESS NOTE    Reason for Evaluation: Follow-up    Diagnosis: Pancreatic adenocarcinoma    Stage: Locally advanced    Performance Status: ECOG PS 1    Diagnosis and Treatment History:  1. Over several months, patient noted worsening abdominal pain and weight loss.  Extensive work-up including EGD, colonoscopy and imaging were unrevealing.  After suggestive HIDA scan, she underwent cholecystectomy on September 06, 2016 but symptoms persisted.      2. MRCP on September 25, 2016 demonstrated 2x2cm soft tissue mass-like abnormality in celiac region, contiguous with medial margin of uncinate process of pancreas concerning for pancreatic lession vs lymphadenopathy.    3. EUS/FNA on September 27, 2016 demonstrated a mass in pancreas uncinate process and lymph node pressing celiac axis. FNA confirmed adenocarcinoma.    4. She was transferred to [facility] for additional management.  Abd/pelvis CT on September 30, 2016 revealed ill-defined prominent retroperitoneal soft tissue with central low attenuation suggestive of necrosis which abuts SMA and is adjacent to pancreatic uncinate process similar to prior MRI. Differential includes pancreatic neoplasm versus lymphadenopathy.  CT angio on September 29, 2016 showed no pulmonary embolism.  Nonspecific 3 mm nodules in right upper and middle lobe, recommend attention on follow-up imaging.      5. Diagnostic laparoscopy and port placement by Dr. Ron Agee on October 04, 2016 - no evidence of metastatic disease.    Current Treatment: FOLFIRINOX chemotherapy - Cycle 1 (in-house) on October 05, 2016.  Cycle 2 on October 19, 2016.  Cycle 3 on November 02, 2016.  Cycle 4 on November 16, 2016.  Cycle 5 on November 27, 2016. Cycle 6 on December 14, 2016.  Cycle 7 on December 28, 2016. Cycle 8 on January 11, 2017.     Interval History: Patient is here today for an urgent add-on visit for concern of bleeding.  Chief complaint: bruising/bleeding     Had a visit with Dr Brion Aliment today and he requested she be seen  before restarting on her anticoagulation   Last dose of Lovenox was yesterday morning   Has several cat scratches on her forearms that have been bleeding.  They are currently both wrapped with gauze and ACE wrap.   Has bruising and ?hematoma along her central abdomen.  Had 4 injections in that area between November 16-18th.  Area is dark eggplant purple, slightly tender.  Below her waistline is also bruised, mottled looking.  This is where she had injections from 18th beyond, where the daughter has been injecting her   None of the areas have gotten bigger or have changed much   Fell in the shower on Thursday night, twisted herself, has some muscular pain    Past Medical/Surgical History:  1. Fibromyalgia  2. Hypothyroidism  3. S/p knee surgery  4. S/p hysterectomy  5. S/p shoulder surgery  6. S/p tonsillectomy    Current Medications:  Current Outpatient Prescriptions on File Prior to Visit   Medication Sig Dispense Refill    enoxaparin (LOVENOX) 100 mg/mL injection Inject 1 Syringe (100 mg total) into the skin daily 30 Syringe 5    sulfamethoxazole-trimethoprim (BACTRIM DS,SEPTRA DS) 800-160 MG per tablet Take 1 tablet by mouth 2 times daily for 10 days 20 tablet 0    amoxicillin-clavulanate (AUGMENTIN) 875-125 MG per tablet Take 1 tablet by mouth 2 times daily for 10 days 20 tablet 0    methadone (DOLOPHINE) 5 MG tablet Take 2 tablets (10 mg total) by mouth 2 times daily  Max daily dose: 20 mg And 15 mg at hs; total MDD 35 mg 210 tablet 0    fluorouracil (ADRUCIL) 50 MG/ML injection Administer 3,477 mg into the vein every 14 days   Start in infusion center.  Infuse over 46 hours at home. 3477 mg 2    pegfilgrastim (NEULASTA) 6 MG/0.6ML injection syringe Inject 0.6 mLs (6 mg total) into the skin once 0.6 mL 3    famotidine (PEPCID) 20 MG tablet Take 1 tablet (20 mg total) by mouth 2 times daily 60 tablet 3    escitalopram (LEXAPRO) 20 MG tablet Take 1 tablet (20 mg total) by mouth daily 30 tablet 3     LORazepam (ATIVAN) 0.5 MG tablet Take 1 tablet (0.5 mg total) by mouth 2 times daily as needed for Anxiety   Max daily dose: 1 mg 60 tablet 0    amitriptyline (ELAVIL) 25 MG tablet Take 1 tablet (25 mg total) by mouth nightly 30 tablet 5    dexamethasone (DECADRON) 4 MG tablet Take 1 tablet (4 mg total) by mouth 2 times daily (Patient taking differently: Take 2 mg by mouth 2 times daily   ) 60 tablet 5    docusate sodium (COLACE) 100 MG capsule Take 2 capsules (200 mg total) by mouth 2 times daily 120 capsule 5    metoclopramide (REGLAN) 5 MG tablet Take 1 tablet (5 mg total) by mouth 3 times daily (before meals) 90 tablet 5    pantoprazole (PROTONIX) 40 MG EC tablet Take 1 tablet (40 mg total) by mouth 2 times daily (before meals)   Swallow whole. Do not crush, break, or chew. 60 tablet 5    senna (SENOKOT) 8.6 MG tablet Take 2 tablets by mouth 2 times daily 120 tablet 5    Meds Exist in Therapy Plan Peg-Filgrastim      heparin 100 UNIT/ML injection 5 mLs (500 Units total) by Intracatheter route as needed (for line care) 30 Syringe 5    sodium chloride 0.9 % flush 10 mLs by Intracatheter route as needed (for line care) 600 mL 5    PREMARIN 0.625 MG tablet       ondansetron (ZOFRAN-ODT) 4 MG disintegrating tablet Take 1 tablet (4 mg total) by mouth 3 times daily as needed for Nausea   Place on top of tongue. 60 tablet 2    levothyroxine (SYNTHROID, LEVOTHROID) 137 MCG tablet Take 1 tablet (137 mcg total) by mouth daily (before breakfast) 30 tablet 0    Vitamins - senior (CENTRUM SILVER) TABS Take 1 tablet by mouth daily 30 tablet 0    triamterene-hydrochlorothiazide (MAXZIDE-25) 37.5-25 MG per tablet Take 1 tablet by mouth daily 30 tablet 0     No current facility-administered medications on file prior to visit.        Social History: Divorced.  Worked in [employer].  Daughter [name]) worked on [employer] and now in home care.    Family History: Mother with breast cancer.  Father with  cancer of unknown type.    Review of Systems: As per Interval History, remainder of 12-point review of systems otherwise negative    Physical Exam:  Vitals:    01/29/17 1144   BP: 102/51   Pulse: 86   Weight: 73.6 kg (162 lb 4.1 oz)   Height: 166.8 cm (5' 5.67")   Gen - NAD, well groomed  HEENT - EOMI, MMMs  Neck - Supple  Chest - CTAB  Heart - RRR, Nl S1/S2  Abdomen - Soft, nondistended, BS present  Skin - Upper abdomen (epigastric area) has an area of dark ecchymosis with 2 firm nodules, approx 2-3 cm each.  Below waistline skin of abdomen is mottled with healing ecchymosis, there are a few small (~1-2 cm) nodules along her lower abdomen.  Right forearm - scabbed excoriations without active bleeding or erythema. Left forearm left wrapped.  Extremities - No edema  Neurologic - Non-focal      Laboratory Review: CBC and CMP from January 28, 2017 reviewed.  Hct dropped from previous reading, 36-->28, treatment not required    Pathology Review: As outlined above    Radiology Review:   1. Chest CT on 01/18/17   1. Filling defect identified in a segmental/subsegmental right lower lobe pulmonary artery consistent with an embolus.   2. Small scattered pulmonary nodules noted, measuring 2 mm in the right middle lobe and in the left lower lobe measuring 2 mm, not significantly changed when compared with prior imaging. Continued attention on follow-up imaging recommended.   3. No clearly enlarged lymph nodes by CT size criteria.      Findings discussed with Gena Fray via phone at 3:05 pm on 01/18/2017.      CT abdomen/pelvis 01/18/17  Stable treated Pancreatic carcinoma (uncinate process). Heterogeneous uncinate process without discrete measurable tumor. Resolution of original regional lymphadenopathy. Small amount of residual soft tissue extending to the SMA. No new metastases.      Altogether stable compared to September 2018 and is improved (partial response) compared to July 2018.       Assessment and Plan: 71 yo  F with locally advanced pancreatic adenocarcinoma.  She completed Cycle 8 of FOLFIRINOX on 01/11/17. Restaging scans show stable disease from September and a good partial response overall. No new areas of disease. No metastatic disease.     She is currently waiting for possible surgical resection with Dr Ron Agee in January 2019.      Hematoma/Lovenox nodules - she has held her lovenox for 24 hours due to active oozing of her cat scratches.  Will continue to hold for a total of 3 days, and she can restart at once daily dosing (1.5mg /kg) on Thursday, 01/31/17.  She is not actively bleeding at this point, and her Lovenox nodules have not changed in size.  She requires anticoagulation as her PE was diagnosed 11 days ago.  She knows to apply pressure if she does have issues with bleeding again.    Patient knows to call back with any questions, concerns or new symptoms.    Martyn Malay, PA-C  Medical Oncology Physician Assistant

## 2017-01-31 ENCOUNTER — Encounter: Payer: Self-pay | Admitting: Oncology

## 2017-01-31 ENCOUNTER — Telehealth: Payer: Self-pay | Admitting: Hematology and Oncology

## 2017-01-31 ENCOUNTER — Encounter: Payer: Self-pay | Admitting: Surgical Oncology

## 2017-01-31 NOTE — H&P (Signed)
HPB-GI Surgery Follow up note    Chief Complaint  Amanda Galloway is a 71 y.o. female who presents  on 01/28/2017 for HPB-GI follow-up care  .   Diagnosis  Locally advanced pancreatic cancer    Physician Team  Surgeon  Delano Metz, MD  PCP Celesta Aver, MD  Gastroenterologist   Medical Oncology: Calton Dach, MD     Interval History  Was doing extremely well until 8 days ago  Started on Lovenox for PE found on restaging imaging  Scratches from previous interaction with new kitten began to bleeding.  Continuous oozing from wounds since that time.  Now on Augmentin for associated cellulitis  Now with weakness, fatigue and SOB.  No abdominal pain, bloating or cramping  Appetite good.  Weight stable    History of the Present Illness   . Over several months, patient noted worsening abdominal pain and weight loss.  Extensive work-up including EGD, colonoscopy and imaging were unrevealing.  After suggestive HIDA scan, she underwent cholecystectomy on September 06, 2016 but symptoms persisted.       MRCP on September 25, 2016 demonstrated2x2cm soft tissue mass-like abnormality in celiac region, contiguous with medial margin of uncinate process of pancreas concerning for pancreatic lession vs lymphadenopathy.     . EUS/FNA on September 27, 2016 demonstrated a mass in pancreas uncinate process and lymph node pressing celiac axis. FNA confirmed adenocarcinoma.      She was transferred to [facility] for additional management.  Abd/pelvis CT on September 30, 2016 revealed ill-defined prominent retroperitoneal soft tissue with central low attenuation suggestive of necrosis which abuts SMA and is adjacent to pancreatic uncinate process similar to prior MRI. Differential includes pancreatic neoplasm versus lymphadenopathy.  CT angio on September 29, 2016 showed no pulmonary embolism.  Nonspecific 3 mm nodules in right upper and middle lobe, recommend attention on follow-up imaging.       . Diagnostic laparoscopy and port placement by Dr. Ron Agee on October 04, 2016 - no evidence of metastatic disease.     FOLFIRINOX chemotherapy - treatment start date 10/05/2016.  Completed 8 cycles  Medical History  Past Medical History:   Diagnosis Date    Cancer     Fibromyalgia     Hypothyroidism      Surgical History  Past Surgical History:   Procedure Laterality Date    CHOLECYSTECTOMY, LAPAROSCOPIC  09/06/2016    HYSTERECTOMY  08/1986    Fibroids    KNEE SURGERY Right     PR INSERT TUNNELED CV CATH WITH PORT Right 10/04/2016    Procedure: Right IJ MEDIPORT Insertion;  Surgeon: Delano Metz, MD;  Location: [facility] MAIN OR;  Service: Oncology General    PR LAP,DIAGNOSTIC ABDOMEN N/A 10/04/2016    Procedure: LAPAROSCOPY DIAGNOSTIC;  Surgeon: Delano Metz, MD;  Location: [facility] MAIN OR;  Service: Oncology General    ROTATOR CUFF REPAIR Right     TONSILLECTOMY AND ADENOIDECTOMY       Family History  Family History   Problem Relation Age of Onset    Breast cancer Mother         died 32    Cancer Father         NHL, died 30    Heart Disease Father     Diabetes Sister     Obesity Sister         4 siblings     Social History  Social History     Social History    Marital status:  Divorced     Spouse name: N/A    Number of children: N/A    Years of education: N/A     Occupational History    Not on file.     Social History Main Topics    Smoking status: Never Smoker    Smokeless tobacco: Never Used    Alcohol use No    Drug use: No    Sexual activity: Not on file     Social History Narrative    No narrative on file       Medications  Outpatient Encounter Prescriptions as of 01/28/2017   Medication Sig Dispense Refill    amoxicillin-clavulanate (AUGMENTIN) 875-125 MG per tablet Take 1 tablet by mouth 2 times daily for 10 days 20 tablet 0    enoxaparin (LOVENOX) 80 mg/0.2mL injection Inject 1 Syringe (80 mg total) into the skin 2 times daily 60 Syringe 5    methadone (DOLOPHINE) 5 MG tablet Take 2 tablets (10 mg total) by mouth 2 times daily   Max daily dose: 20 mg And 15 mg at hs;  total MDD 35 mg 210 tablet 0    fluorouracil (ADRUCIL) 50 MG/ML injection Administer 3,477 mg into the vein every 14 days   Start in infusion center.  Infuse over 46 hours at home. 3477 mg 2    pegfilgrastim (NEULASTA) 6 MG/0.6ML injection syringe Inject 0.6 mLs (6 mg total) into the skin once 0.6 mL 3    famotidine (PEPCID) 20 MG tablet Take 1 tablet (20 mg total) by mouth 2 times daily 60 tablet 3    escitalopram (LEXAPRO) 20 MG tablet Take 1 tablet (20 mg total) by mouth daily 30 tablet 3    LORazepam (ATIVAN) 0.5 MG tablet Take 1 tablet (0.5 mg total) by mouth 2 times daily as needed for Anxiety   Max daily dose: 1 mg 60 tablet 0    amitriptyline (ELAVIL) 25 MG tablet Take 1 tablet (25 mg total) by mouth nightly 30 tablet 5    dexamethasone (DECADRON) 4 MG tablet Take 1 tablet (4 mg total) by mouth 2 times daily (Patient taking differently: Take 2 mg by mouth 2 times daily   ) 60 tablet 5    docusate sodium (COLACE) 100 MG capsule Take 2 capsules (200 mg total) by mouth 2 times daily 120 capsule 5    metoclopramide (REGLAN) 5 MG tablet Take 1 tablet (5 mg total) by mouth 3 times daily (before meals) 90 tablet 5    pantoprazole (PROTONIX) 40 MG EC tablet Take 1 tablet (40 mg total) by mouth 2 times daily (before meals)   Swallow whole. Do not crush, break, or chew. 60 tablet 5    senna (SENOKOT) 8.6 MG tablet Take 2 tablets by mouth 2 times daily 120 tablet 5    Meds Exist in Therapy Plan Peg-Filgrastim      heparin 100 UNIT/ML injection 5 mLs (500 Units total) by Intracatheter route as needed (for line care) 30 Syringe 5    sodium chloride 0.9 % flush 10 mLs by Intracatheter route as needed (for line care) 600 mL 5    PREMARIN 0.625 MG tablet       ondansetron (ZOFRAN-ODT) 4 MG disintegrating tablet Take 1 tablet (4 mg total) by mouth 3 times daily as needed for Nausea   Place on top of tongue. 60 tablet 2    levothyroxine (SYNTHROID, LEVOTHROID) 137 MCG tablet Take 1 tablet (137 mcg total) by  mouth daily (  before breakfast) 30 tablet 0    Vitamins - senior (CENTRUM SILVER) TABS Take 1 tablet by mouth daily 30 tablet 0    triamterene-hydrochlorothiazide (MAXZIDE-25) 37.5-25 MG per tablet Take 1 tablet by mouth daily 30 tablet 0     No facility-administered encounter medications on file as of 01/28/2017.      Allergies  No Known Allergies (drug, envir, food or latex)  Review of Systems  Review of Systems   Constitutional: Positive for malaise/fatigue. Negative for chills, fever and weight loss.   HENT: Negative for congestion and sore throat.    Eyes: Negative for blurred vision and redness.   Respiratory: Positive for shortness of breath. Negative for cough, hemoptysis and sputum production.    Cardiovascular: Negative for chest pain, palpitations and leg swelling.   Gastrointestinal: Negative for abdominal pain, blood in stool, constipation, diarrhea, heartburn, melena, nausea and vomiting.   Genitourinary: Negative for dysuria, flank pain and frequency.   Musculoskeletal: Negative for back pain, joint pain and myalgias.   Skin: Negative for itching and rash.        Constant oozing of blood from previous cat scratches on bilateral forearms since starting Lovenox    Neurological: Negative for dizziness, focal weakness, seizures and headaches.   Endo/Heme/Allergies: Bruises/bleeds easily.   Psychiatric/Behavioral: Negative for depression. The patient is nervous/anxious.      Physical Exam  BP 108/55 (BP Location: Left arm, Patient Position: Sitting, Cuff Size: adult)    Pulse 90    Temp 36.8 C (98.2 F) (Temporal)    Resp 17    Ht 165.1 cm (5\' 5" )    Wt 73 kg (160 lb 15 oz)    SpO2 96%    BMI 26.78 kg/m   Physical Exam   Constitutional: She is oriented to person, place, and time. She appears well-developed and well-nourished. No distress.   HENT:   Head: Normocephalic and atraumatic.   Mouth/Throat: Oropharynx is clear and moist.   Eyes: Pupils are equal, round, and reactive to light. Conjunctivae and  EOM are normal. No scleral icterus.   Neck: Normal range of motion. Neck supple. No JVD present. No thyromegaly present.   Cardiovascular: Normal rate, regular rhythm, normal heart sounds and intact distal pulses.    No murmur heard.  Pulmonary/Chest: Effort normal and breath sounds normal. No respiratory distress. She exhibits no tenderness.   Abdominal: Soft. Bowel sounds are normal. She exhibits no distension and no mass. There is no tenderness. There is no rebound and no guarding.   Musculoskeletal: Normal range of motion. She exhibits no edema or tenderness.   Lymphadenopathy:     She has no cervical adenopathy.   Neurological: She is alert and oriented to person, place, and time. She has normal reflexes. No cranial nerve deficit.   Skin: Skin is warm and dry. No rash noted. No erythema. No pallor.        Scratches to bilateral forearms. No erythema,  Active oozing of blood from areas.  No evidence of infection    Psychiatric: She has a normal mood and affect. Her behavior is normal. Judgment and thought content normal.     Laboratory Data  Component      Latest Ref Rng & Units 01/22/2017 01/11/2017 12/28/2016 11/16/2016   CA 19 9 (eff. 03-2009)      0 - 35 U/mL 44 (H) 45 (H) 48 (H) 111 (H)     Component      Latest Ref Rng & Units  10/16/2016   CA 19 9 (eff. 03-2009)      0 - 35 U/mL 474 (H)     Pathology  Surgical Pathology [phone]   Additional Copy to:   [place]     FINAL DIAGNOSIS:   Outside material received from [facility], [place],   [place] for consultation.     Cytology Consultation:   Pancreas, head, mass, fine needle aspiration ([id number], 09/27/2016):     Atypical cells suspicious for adenocarcinoma. See comment.     Comment: Limited cells of interest are present for evaluation. Clinical   correlation is recommended.              Imaging Data  01/18/2017 12:35 PM    CT CHEST WITH IV CONTRAST  IMPRESSION:    1.  Filling defect identified in a  segmental/subsegmental right lower lobe pulmonary artery consistent with an embolus.  2.  Small scattered pulmonary nodules noted, measuring 2 mm in the right middle lobe and in the left lower lobe measuring 2 mm, not significantly changed when compared with prior imaging. Continued attention on follow-up imaging recommended.  3.  No clearly enlarged lymph nodes by CT size criteria.    Findings discussed with Gena Fray via phone at 3:05 pm on 01/18/2017.     Please see the separately dictated CT abdomen and pelvis imaging report for detailed findings below the level the diaphragm.      END OF IMPRESSION    01/18/2017 12:35 PM    CT ABDOMEN AND PELVIS WITH IV CONTRAST  IMPRESSION:    Stable treated Pancreatic carcinoma (uncinate process). Heterogeneous uncinate process without discrete measurable tumor. Resolution of original regional lymphadenopathy. Small amount of residual soft tissue extending to the SMA. No new metastases.    Altogether stable compared to September 2018 and is improved (partial response) compared to July 2018.      END OF IMPRESSION    Assessment  71 year old female with borderline resectable pancreatic cancer and ongoing abdominal pain. She completed Cycle 8 of FOLFIRINOX on 01/11/17. Feels fatigued and minor anorexia.  Restaging scans show good partial response overall and stable disease from September. No new areas of disease or suggestion of metastasis. Her CA 19-9 declined from 384 to 37. Chest Imaging showed pulmonary emboli and she was started on anticoagulation. She was asymptomatic at the time but now complains of some shortness of breath. No leg swelling. We discussed general guidelines for new pulmonary embolism are for 3-6 months of therapy prior to discontinuation of anticoagulation or elective surgery. While this would be unacceptable in her case, I explained that we would like her to have some time prior to planning surgery. A bridging treatment would be to consider  radiation to the abdomen. The benefit of radiation in local control was discussed.     Recent new kitten and has several scratches to the arm with bleeding and erythema. Lab indicates leukocytosis. Started on antibiotics.     Ongoing abdominal pain which is being treated with methadone.     Will discuss at Multidisciplinary GI Tumor Conference later this week. May consider radiation as we wait for her to recuperate from chemotherapy. Emotional support was provided.     Plan   Radiation Oncology consultation for preoperative radiation    Tentatively schedule for exploratory laparotomy, [place], possible electroporation in first week of January 2019   Wound care instructions reviewed.  Management of Lovenox per Oncology team  Will treat prophylactically for cat scratch disease w/ 7 day course of Bactrim    Follow up 6 weeks    Arty Baumgartner, NP    HPB-GI Attending Addendum:       I saw, examined and evaluated the patient, reviewed the patient chart and the relevant images and lab findings. Jhonnie Garner, NP has acted as a Education administrator for this encounter at this office visit.     I formulated the assessment plan and directed the care of this patient. My detailed recommendations and opinion are documented in the above edited note which reflect the management plans. The patient is agreeable with this plan.    Delano Metz, MD, FACS  Associate Professor of Surgery  Division of Hepatobiliary, Pancreas, & GI Surgery

## 2017-02-01 ENCOUNTER — Telehealth: Payer: Self-pay | Admitting: Hematology and Oncology

## 2017-02-01 ENCOUNTER — Ambulatory Visit: Payer: PRIVATE HEALTH INSURANCE

## 2017-02-01 DIAGNOSIS — C259 Malignant neoplasm of pancreas, unspecified: Secondary | ICD-10-CM

## 2017-02-01 LAB — CBC AND DIFFERENTIAL
Baso # K/uL: 0.1 10*3/uL (ref 0.0–0.1)
Basophil %: 1.2 %
Eos # K/uL: 0.3 10*3/uL (ref 0.0–0.4)
Eosinophil %: 2.4 %
Hematocrit: 29 % — ABNORMAL LOW (ref 34–45)
Hemoglobin: 9.4 g/dL — ABNORMAL LOW (ref 11.2–15.7)
IMM Granulocytes #: 0.3 10*3/uL — ABNORMAL HIGH (ref 0.0–0.1)
IMM Granulocytes: 2.6 %
Lymph # K/uL: 3.1 10*3/uL (ref 1.2–3.7)
Lymphocyte %: 28.8 %
MCH: 35 pg/cell — ABNORMAL HIGH (ref 26–32)
MCHC: 32 g/dL (ref 32–36)
MCV: 110 fL — ABNORMAL HIGH (ref 79–95)
Mono # K/uL: 1.1 10*3/uL — ABNORMAL HIGH (ref 0.2–0.9)
Monocyte %: 9.9 %
Neut # K/uL: 6 10*3/uL (ref 1.6–6.1)
Nucl RBC # K/uL: 0.1 10*3/uL — ABNORMAL HIGH (ref 0.0–0.0)
Nucl RBC %: 0.6 /100 WBC — ABNORMAL HIGH (ref 0.0–0.2)
Platelets: 274 10*3/uL (ref 160–370)
RBC: 2.7 MIL/uL — ABNORMAL LOW (ref 3.9–5.2)
RDW: 19.7 % — ABNORMAL HIGH (ref 11.7–14.4)
Seg Neut %: 55.1 %
WBC: 10.9 10*3/uL — ABNORMAL HIGH (ref 4.0–10.0)

## 2017-02-01 LAB — COMPREHENSIVE METABOLIC PANEL
ALT: 67 U/L — ABNORMAL HIGH (ref 0–35)
AST: 40 U/L — ABNORMAL HIGH (ref 0–35)
Albumin: 4.3 g/dL (ref 3.5–5.2)
Alk Phos: 147 U/L — ABNORMAL HIGH (ref 35–105)
Anion Gap: 18 — ABNORMAL HIGH (ref 7–16)
Bilirubin,Total: 0.3 mg/dL (ref 0.0–1.2)
CO2: 25 mmol/L (ref 20–28)
Calcium: 8.9 mg/dL (ref 8.6–10.2)
Chloride: 97 mmol/L (ref 96–108)
Creatinine: 1.01 mg/dL — ABNORMAL HIGH (ref 0.51–0.95)
GFR,Black: 65 *
GFR,Caucasian: 56 * — AB
Glucose: 111 mg/dL — ABNORMAL HIGH (ref 60–99)
Lab: 25 mg/dL — ABNORMAL HIGH (ref 6–20)
Potassium: 4 mmol/L (ref 3.3–5.1)
Sodium: 140 mmol/L (ref 133–145)
Total Protein: 6.8 g/dL (ref 6.3–7.7)

## 2017-02-01 LAB — NEUTROPHIL #-INSTRUMENT: Neutrophil #-Instrument: 6 10*3/uL

## 2017-02-01 MED ORDER — ENOXAPARIN SODIUM 100 MG/ML IJ SOSY *I*
100.0000 mg | PREFILLED_SYRINGE | Freq: Every day | INTRAMUSCULAR | 5 refills | Status: DC
Start: 2017-02-01 — End: 2017-02-27

## 2017-02-01 NOTE — Progress Notes (Signed)
[facility] SOCIAL WORK:   [facility] Social Work Therapist, nutritional:     Social Work contact made?: Yes      Other method of contact:  Medical team    Risk Factors:  Barriers to Care and Financial    Social Work post assessment plan:  Referral  Referral:     Pharmacy Assistance:  Pharmacy Voucher    Time spent on this encounter (in minutes):  25    [place] date:  February 01, 2017    Patient Name: Amanda Galloway      Medical Record #: [id number]   DOB: 1946/02/03  Patients Address: [address]                Social Worker: Jamie Brookes, LMSW       Date of Service: February 01, 2017       Funding Source: [place]  ___________________________________________________________________    Pharmacy Information:  Date/time sent: February 01, 2017     Time needed: asap    Patient Location: Outpatient    Medication Pick-up Preference: Patient will pick up at the pharmacy    Pharmacy Contact: [place] work signature: Jamie Brookes, LMSW  Supervisor/Manager Approval (if indicated):  Date:  (supervisor signature not required for Medicaid pending)    To cover cost of Lovenox, 1x as team believes prior authorization will eventually go thru for future prescriptions.

## 2017-02-01 NOTE — Progress Notes (Signed)
IVAD accessed with good blood return. Labs obtained per orders. Flushed with normal saline and heparin and deaccessed. Tolerated well.

## 2017-02-01 NOTE — Telephone Encounter (Signed)
Spoke with Amanda Galloway, let her know that our prior auth representative told us the insurance company is refusing to take any urgent request for the Lovenox increase.   We have sent a new prescription to our out patient pharmacy and social work has agreed to cover payment for today. [name] verbalized understanding.

## 2017-02-05 ENCOUNTER — Telehealth: Payer: Self-pay | Admitting: Hematology and Oncology

## 2017-02-05 NOTE — Telephone Encounter (Signed)
Spoke with Ronny Bacon, reviewed plan of care. All questions answered. She verbalized understanding.

## 2017-02-06 ENCOUNTER — Telehealth: Payer: Self-pay | Admitting: Hematology and Oncology

## 2017-02-06 NOTE — Telephone Encounter (Signed)
Patient is scheduled for initial consult with Rad Onc on Dec 12 - she is definitely a good candidate for RT and Rad Onc will be able to start treatment 1-2 weeks after the initial consult.    Danise Mina, can you see if Dr. Trudie Buckler or Dr. Ron Parker can see patient sooner than Dec 12?  Maybe Dr. Trudie Buckler can see her this Friday, Dec 7 perhaps since Dr. Ron Parker is out?  She needs to get started soon.  Please let patient know either way.    Thank you

## 2017-02-08 ENCOUNTER — Ambulatory Visit: Payer: Medicare (Managed Care) | Admitting: Radiation Oncology

## 2017-02-08 ENCOUNTER — Telehealth: Payer: Self-pay | Admitting: Hematology and Oncology

## 2017-02-08 NOTE — Telephone Encounter (Signed)
Spoke with home care nurse Amanda Galloway, she called to let us know that Amanda Galloway at her visit today reported SOB, she listened to her lungs and heard "a pop" which she felt was unusual. Amanda Galloway complained of mid sternal chest pain last night and took a Dilaudid tablet for 5/10 pain. Amanda Galloway that I would make the team aware and call back if they would advise any care or treatment. She is on Lovenox currently for PE. Amanda Galloway verbalized understanding.

## 2017-02-12 ENCOUNTER — Other Ambulatory Visit: Payer: Self-pay | Admitting: Hematology and Oncology

## 2017-02-12 ENCOUNTER — Encounter: Payer: Self-pay | Admitting: Radiation Oncology

## 2017-02-12 ENCOUNTER — Ambulatory Visit: Payer: Medicare (Managed Care) | Attending: Radiation Oncology | Admitting: Radiation Oncology

## 2017-02-12 VITALS — BP 115/65 | HR 82 | Temp 98.2°F | Wt 157.9 lb

## 2017-02-12 DIAGNOSIS — Y9289 Other specified places as the place of occurrence of the external cause: Secondary | ICD-10-CM | POA: Diagnosis present

## 2017-02-12 DIAGNOSIS — W5503XA Scratched by cat, initial encounter: Secondary | ICD-10-CM | POA: Insufficient documentation

## 2017-02-12 DIAGNOSIS — G893 Neoplasm related pain (acute) (chronic): Secondary | ICD-10-CM

## 2017-02-12 DIAGNOSIS — C250 Malignant neoplasm of head of pancreas: Secondary | ICD-10-CM | POA: Insufficient documentation

## 2017-02-12 NOTE — H&P (Addendum)
Radiation Oncology Consult Note 02/12/2017      Patient ID: Amanda Galloway is a 71 y.o. female with locally advanced pancreatic adenocarcinoma. She completed 8 cycles of FOLFIRINOX on 01/11/17 with restaging scans showing stable disease / partial response. She was referred for discussion of SBRT prior to possible surgical resection.      Diagnosis: Cancer Staging  Malignant neoplasm of pancreas  Staging form: Exocrine Pancreas, AJCC 8th Edition  - Clinical: cT1c, cM0 - Unsigned    HPI:  Oncologic history obtained from medical oncology documentation:     1. Over several months, patient noted worsening abdominal pain and weight loss.  Extensive work-up including EGD, colonoscopy and imaging were unrevealing.  After suggestive HIDA scan, she underwent cholecystectomy on September 06, 2016 but symptoms persisted.      2. MRCP on September 25, 2016 demonstrated2x2cm soft tissue mass-like abnormality in celiac region, contiguous with medial margin of uncinate process of pancreas concerning for pancreatic lession vs lymphadenopathy.    3. EUS/FNA on September 27, 2016 demonstrated a mass in pancreas uncinate process and lymph node pressing celiac axis. FNA confirmed adenocarcinoma.    4. She was transferred to [facility] for additional management.  Abd/pelvis CT on September 30, 2016 revealed ill-defined prominent retroperitoneal soft tissue with central low attenuation suggestive of necrosis which abuts SMA and is adjacent to pancreatic uncinate process similar to prior MRI. Differential includes pancreatic neoplasm versus lymphadenopathy.  CT angio on September 29, 2016 showed no pulmonary embolism.  Nonspecific 3 mm nodules in right upper and middle lobe, recommend attention on follow-up imaging.      5. Diagnostic laparoscopy and port placement by Dr. Ron Agee on October 04, 2016 - no evidence of metastatic disease.    6. FOLFIRINOX chemotherapy, 10/05/16 - 01/11/17 (8 cycles)       Patient presents with her daughter, Amanda Galloway. Currently her most  significant symptom is shortness of breath; she has been on lovenox for ~3 weeks for treatment of PE. Her energy has been very low and she spends some of her days entirely on the couch. Her daughter notes that when she's out of the house she feels more energized and they were able to do two full days of shopping recently.     She has ongoing epigastric pain which is well controlled on methadone. Her appetite is improved. No nausea. History of headaches which have been more frequent in the past few weeks. [name] had trouble with balance and has had falls at home, most recently two weeks ago when getting out of the shower.     She lives in [place] with her daughter, Amanda Galloway, who is a Marine scientist.     Risk Factors: never smoker, no significant alcohol history      Review of Systems: The pertinent positives and negatives are mentioned in the HPI. The remainder of the comprehensive 10 point review of systems was negative.     Radiation specific concerns:  Cardiac pacemaker/AICD: Denies  T2DM? Denies   Autoimmune disease: Denies  Claustrophobia? Denies   Contrast dye allergy? Denies  Difficulty lying flat: Denies  Previous radiation: Denies    Current Outpatient Prescriptions   Medication Sig    enoxaparin (LOVENOX) 100 mg/mL injection Inject 1 Syringe (100 mg total) into the skin daily    methadone (DOLOPHINE) 5 MG tablet Take 2 tablets (10 mg total) by mouth 2 times daily   Max daily dose: 20 mg And 15 mg at hs; total MDD 35 mg  famotidine (PEPCID) 20 MG tablet Take 1 tablet (20 mg total) by mouth 2 times daily    escitalopram (LEXAPRO) 20 MG tablet Take 1 tablet (20 mg total) by mouth daily    LORazepam (ATIVAN) 0.5 MG tablet Take 1 tablet (0.5 mg total) by mouth 2 times daily as needed for Anxiety   Max daily dose: 1 mg    amitriptyline (ELAVIL) 25 MG tablet Take 1 tablet (25 mg total) by mouth nightly    dexamethasone (DECADRON) 4 MG tablet Take 1 tablet (4 mg total) by mouth 2 times daily (Patient taking  differently: Take 4 mg by mouth daily   )    docusate sodium (COLACE) 100 MG capsule Take 2 capsules (200 mg total) by mouth 2 times daily    metoclopramide (REGLAN) 5 MG tablet Take 1 tablet (5 mg total) by mouth 3 times daily (before meals)    pantoprazole (PROTONIX) 40 MG EC tablet Take 1 tablet (40 mg total) by mouth 2 times daily (before meals)   Swallow whole. Do not crush, break, or chew.    senna (SENOKOT) 8.6 MG tablet Take 2 tablets by mouth 2 times daily    ondansetron (ZOFRAN-ODT) 4 MG disintegrating tablet Take 1 tablet (4 mg total) by mouth 3 times daily as needed for Nausea   Place on top of tongue.    Vitamins - senior (CENTRUM SILVER) TABS Take 1 tablet by mouth daily    fluorouracil (ADRUCIL) 50 MG/ML injection Administer 3,477 mg into the vein every 14 days   Start in infusion center.  Infuse over 46 hours at home.    pegfilgrastim (NEULASTA) 6 MG/0.6ML injection syringe Inject 0.6 mLs (6 mg total) into the skin once    Meds Exist in Therapy Plan Peg-Filgrastim    heparin 100 UNIT/ML injection 5 mLs (500 Units total) by Intracatheter route as needed (for line care)    sodium chloride 0.9 % flush 10 mLs by Intracatheter route as needed (for line care)    PREMARIN 0.625 MG tablet     levothyroxine (SYNTHROID, LEVOTHROID) 137 MCG tablet Take 1 tablet (137 mcg total) by mouth daily (before breakfast)    triamterene-hydrochlorothiazide (MAXZIDE-25) 37.5-25 MG per tablet Take 1 tablet by mouth daily     Past Medical History:   Diagnosis Date    Cancer     Fibromyalgia     Hypothyroidism      Social History     Social History    Marital status: Divorced     Spouse name: N/A    Number of children: N/A    Years of education: N/A     Social History Main Topics    Smoking status: Never Smoker    Smokeless tobacco: Never Used    Alcohol use No    Drug use: No    Sexual activity: Not Asked     Other Topics Concern    None     Social History Narrative    None     Cancer-related family  history includes Breast cancer in her mother; Cancer in her father.     Physical Exam:  BP 115/65    Pulse 82    Temp 36.8 C (98.2 F) (Temporal)    Wt 71.6 kg (157 lb 14.2 oz)    BMI 25.74 kg/m   Pain    02/12/17 1006   PainSc:   0 - No pain     Performance Status (KPS): Score-70-Cares for self, unable to  do activities or active work.    General: alert and oriented, no acute distress  HEENT: NCAT  Neck: no palpable LAD  Neuro: fluent speech, no dysarthria, no tremor, sensation to light touch grossly intact throughout  Lungs: CTA bilaterally, respirations non labored   CV: RRR  Abdomen: soft, non-distended, normoactive bowel sounds, (+) epigastric tenderness   MSK: no tenderness, no extremity edema   Psych: cooperative, appropriate affect    Imaging:  Ct Abdomen And Pelvis With Contrast  Result Date: 01/18/2017  Stable treated Pancreatic carcinoma (uncinate process). Heterogeneous uncinate process without discrete measurable tumor. Resolution of original regional lymphadenopathy. Small amount of residual soft tissue extending to the SMA. No new metastases. Altogether stable compared to September 2018 and is improved (partial response) compared to July 2018.     Ct Chest With Contrast  Result Date: 01/18/2017  1.  Filling defect identified in a segmental/subsegmental right lower lobe pulmonary artery consistent with an embolus.   2.  Small scattered pulmonary nodules noted, measuring 2 mm in the right middle lobe and in the left lower lobe measuring 2 mm, not significantly changed when compared with prior imaging. Continued attention on follow-up imaging recommended.   3.  No clearly enlarged lymph nodes by CT size criteria. Findings discussed with Amanda Galloway via phone at 3:05 pm on 01/18/2017. Please see the separately dictated CT abdomen and pelvis imaging report for detailed findings below the level the diaphragm.       Pathology:  Incoming Consult Med Cyto       Collected: 10/18/2016 10:35 AM         FINAL  DIAGNOSIS:   Outside material received from [facility], [place] for consultation.     Cytology Consultation:   Pancreas, head, mass, fine needle aspiration ([id number], 09/27/2016):     Atypical cells suspicious for adenocarcinoma. See comment.     Comment: Limited cells of interest are present for evaluation. Clinical correlation is recommended.     Laboratory Data:  Reviewed.      Assessment:  Amanda Galloway is a 71 y.o. female with locally advanced pancreatic adenocarcinoma. She completed 8 cycles of FOLFIRINOX on 01/11/17 with restaging scans showing stable disease / partial response. She was referred for discussion of SBRT prior to possible surgical resection.      Recommendations/Plan:   Ms. Maestre was seen and evaluated by Dr. Trudie Buckler. We had an extensive discussion with her regarding her diagnosis and therapeutic options. After discussing the risks, benefits, and alternatives, in addition to the early and late side effects, she is amenable to pursuing radiotherapy.  All of the patients questions were answered to her satisfaction and informed consent was obtained.      Will contact GI to discuss fiducial placement prior to RT planning    Following fiducial placement will schedule CT simulation. A treatment plan will be generated soon afterwards.   It is anticipated that the entire course of treatment will span 1 week (about 5 treatments)     Amanda Galloway was encouraged to contact us at any time with any concerns or questions.     ~ Amanda Mortimer, PA-C     _______________________________________________________  --------------------------------------------------------------------------------------------    I personally saw, examined and evaluated the patient and participated in all aspects of medical decision making. I have reviewed and edited the physician assistant's note and agree with the documented findings and plan of care.    Stage: Locally advanced pancreatic cancer  Performance  Status: ECOG 2    71 yo female diagnosed diagnosed in July 2018 with pancreatic adenocarcinoma. The tumor was locally advanced involving uncinate process and the SMA. Patient started FOLFIRINOX chemotherapy and received 8 cycles from Aug to Nov 2018 with marked decrease in her CA199 and size reduction on CT scans in Nov 2018. The uncinate process remains heterogenous without discrete mass with small amount of soft tissue extending to SMA without new areas of disease or metastases. Patient was recently diagnosed with a pulmonary embolism and is on anticoagulation. She currently feels extremely fatigued and spends all day on the couch. A large part of this is likely depression as patient seems physiologically capable of activity per both her and her daughter,     On physical exam, patient appeared to be in no acute distress and was cooperative and has appropriate affect. Neurologically intact without any focal neurologic deficit . The eyes are reactive and has normal extraocular motion.  Patient has regular, non labored breathing with lungs that are clear. Heart beat is regular and normal. Abdomen is soft and non tender to palpation with normal bowel sounds. There is no cervical or supraclavicular adenopathy. There is no pain to palpation in the spine or bones.      A/P: Patient would benefit from SBRT radiation therapy for bridging treatment to surgical resection as per NCCN guidelines for locally advanced pancreatic cancer. Goal would be sterilize tumor at the margins especially around the SMA to hopefully aid in an R0 resection and decrease the risk of recurrence after surgery. In addition, given recent PE, patient requires some additional recovery time prior to surgery. We discussed logistics and potential complications of SBRT over 5 fractions. Patient would benefit from fiducial placement to help guide SBRT and I will reach out to GI to see if this can be done in an expeditious manner that would allow her to  undergo surgery in the expected timeframe. We also discussed staying physically active to strength her body for anticipated surgery.      Frederica Kuster MD  Assistant Professor of [place] [facility]

## 2017-02-12 NOTE — Progress Notes (Signed)
[facility] SOCIAL WORK:   [facility] Social Work Therapist, nutritional:     Social Work contact made?: Yes      Method of contact:  In-person    Risk Factors:  Emotional and [place]/Family Distress    Social Work post assessment plan:  As needed follow-up  Referral:     Time spent on this encounter (in minutes):  79  met with patient and her daughter [name] at her radiation consult for her ongoing treatment of panreatic cancer per request of Dr. Trudie Buckler.   The main issue today is that the patients spends her entire day on the couch.. Previous to her diagnosis she was active- going the gym- working full-time even at 12.   patient has moved from [place] to live with her daughter here in [place] - to get treatment for her cancer. 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 She does have a car - but currently has lent it to someone- so does not have that as a option to get out of the house.   She spends the day alone while her daughter is at work.- However there is a dog and some cats with her. .   We had a lengthy discussion about the likely causes of her fatigue ( treatment, depression , life style changes etc) as well as ideas on how to try remedy it.   We discussed the importance of walking every day- even for 10 min to start. She especially could take the dog for a walk.   We also discussed getting a psych/med consult to see if there was something in addition to her 85m of Lexapro that could address the depression aspect.   Both the patient and her daughter were amenable to all the ideas discussed.    I will ask her medical oncology team to make a referral to Dr. RJanna Arch- cancer center oncologist.

## 2017-02-13 ENCOUNTER — Telehealth: Payer: Self-pay | Admitting: Gastroenterology

## 2017-02-13 ENCOUNTER — Ambulatory Visit: Payer: Medicare (Managed Care) | Admitting: Radiation Oncology

## 2017-02-13 NOTE — Telephone Encounter (Signed)
Spoke with pt, scheduled EUS-FNA under Fluoroscopy on 12/18 at 9:15am arrival at [facility].    Nurse will go over prep.

## 2017-02-13 NOTE — Telephone Encounter (Signed)
Called pt to review EUS prep. Noted lovenox on med list. No answer, LVM to return call to discuss.

## 2017-02-13 NOTE — Telephone Encounter (Signed)
Spoke with Kennyth Lose from Dr Paulo Fruit office to hold Lovenox 12hrs prior.

## 2017-02-13 NOTE — Addendum Note (Signed)
Addended by: Voncille Lo on: 02/13/2017 12:15 PM     Modules accepted: Orders

## 2017-02-15 NOTE — Telephone Encounter (Signed)
Yes, okay to hold Lovenox 24 hours prior to procedure.    Thanks

## 2017-02-15 NOTE — Telephone Encounter (Signed)
Called and spoke with pt; reviewed Lovenox hold for 12/18 proc

## 2017-02-18 ENCOUNTER — Telehealth: Payer: Self-pay

## 2017-02-18 NOTE — Telephone Encounter (Signed)
Spoke to pt to remind her about 12/18 GI procedure.  Pt confirmed she has prep instructions and ride to/from appt.  Reminded her of arrival time 9:15.

## 2017-02-19 ENCOUNTER — Ambulatory Visit
Admission: RE | Admit: 2017-02-19 | Discharge: 2017-02-19 | Disposition: A | Discharge status: Home / Self Care | Payer: Medicare (Managed Care) | Source: Ambulatory Visit | Attending: Gastroenterology | Admitting: Gastroenterology

## 2017-02-19 ENCOUNTER — Other Ambulatory Visit: Payer: Self-pay | Admitting: Gastroenterology

## 2017-02-19 ENCOUNTER — Encounter: Payer: Self-pay | Admitting: Gastroenterology

## 2017-02-19 DIAGNOSIS — Z01818 Encounter for other preprocedural examination: Secondary | ICD-10-CM

## 2017-02-19 DIAGNOSIS — C250 Malignant neoplasm of head of pancreas: Secondary | ICD-10-CM | POA: Insufficient documentation

## 2017-02-19 DIAGNOSIS — C259 Malignant neoplasm of pancreas, unspecified: Secondary | ICD-10-CM

## 2017-02-19 HISTORY — DX: Depression, unspecified: F32.A

## 2017-02-19 MED ORDER — LACTATED RINGERS IV SOLN *I*
100.0000 mL/h | INTRAVENOUS | Status: DC
Start: 2017-02-19 — End: 2017-02-20

## 2017-02-19 MED ORDER — FENTANYL CITRATE 50 MCG/ML IJ SOLN *WRAPPED*
INTRAMUSCULAR | Status: AC | PRN
Start: 2017-02-19 — End: 2017-02-19
  Administered 2017-02-19 (×4): 50 ug via INTRAVENOUS

## 2017-02-19 MED ORDER — LACTATED RINGERS IV SOLN *I*
100.0000 mL/h | INTRAVENOUS | Status: DC
Start: 2017-02-19 — End: 2017-02-20
  Administered 2017-02-19: 100 mL/h via INTRAVENOUS

## 2017-02-19 MED ORDER — MIDAZOLAM HCL 5 MG/5ML IJ SOLN *I*
INTRAMUSCULAR | Status: AC
Start: 2017-02-19 — End: 2017-02-19
  Filled 2017-02-19: qty 5

## 2017-02-19 MED ORDER — HEPARIN LOCK FLUSH 10 UNIT/ML IJ SOLN WRAPPED *I*
INTRAVENOUS | Status: AC
Start: 2017-02-19 — End: 2017-02-19
  Filled 2017-02-19: qty 5

## 2017-02-19 MED ORDER — FENTANYL CITRATE 50 MCG/ML IJ SOLN *WRAPPED*
INTRAMUSCULAR | Status: AC
Start: 2017-02-19 — End: 2017-02-19
  Filled 2017-02-19: qty 4

## 2017-02-19 MED ORDER — FENTANYL CITRATE 50 MCG/ML IJ SOLN *WRAPPED*
INTRAMUSCULAR | Status: AC
Start: 2017-02-19 — End: 2017-02-19
  Filled 2017-02-19: qty 2

## 2017-02-19 MED ORDER — CIPROFLOXACIN IN D5W 400 MG/200ML IV SOLN *I*
INTRAVENOUS | Status: AC | PRN
Start: 2017-02-19 — End: 2017-02-19
  Administered 2017-02-19: 400 mg via INTRAVENOUS

## 2017-02-19 MED ORDER — MIDAZOLAM HCL 5 MG/5ML IJ SOLN *I*
INTRAMUSCULAR | Status: AC
Start: 2017-02-19 — End: 2017-02-19
  Filled 2017-02-19: qty 10

## 2017-02-19 MED ORDER — MIDAZOLAM HCL 5 MG/5ML IJ SOLN *I*
INTRAMUSCULAR | Status: AC | PRN
Start: 2017-02-19 — End: 2017-02-19
  Administered 2017-02-19 (×6): 2 mg via INTRAVENOUS

## 2017-02-19 NOTE — Progress Notes (Signed)
4 fiducials placed for this patient

## 2017-02-19 NOTE — Preop H&P (Signed)
OUTPATIENT PRE-PROCEDURE H&P    Chief Complaint / Indications for Procedure: Pancreatic adenocarcinoma - uncinate process. Plan for SBRT prior to resection.   Plan for EUS guided fiducial placement.   Discussed risks and complications of EUS-FNA. The complications include infection, bleeding, perforation etc.  Patient understands the risks and agrees with the procedure      Past Medical History:     Past Medical History:   Diagnosis Date    Cancer     Depression     Fibromyalgia     Hypothyroidism      Past Surgical History:   Procedure Laterality Date    CHOLECYSTECTOMY      CHOLECYSTECTOMY, LAPAROSCOPIC  09/06/2016    HYSTERECTOMY  08/1986    Fibroids    KNEE SURGERY Right     PR INSERT TUNNELED CV CATH WITH PORT Right 10/04/2016    Procedure: Right IJ MEDIPORT Insertion;  Surgeon: Delano Metz, MD;  Location: [facility] MAIN OR;  Service: Oncology General    PR LAP,DIAGNOSTIC ABDOMEN N/A 10/04/2016    Procedure: LAPAROSCOPY DIAGNOSTIC;  Surgeon: Delano Metz, MD;  Location: [facility] MAIN OR;  Service: Oncology General    ROTATOR CUFF REPAIR Right     TONSILLECTOMY AND ADENOIDECTOMY       Family History   Problem Relation Age of Onset    Breast cancer Mother         died 50    Cancer Father         NHL, died 31    Heart Disease Father     Diabetes Sister     Obesity Sister         4 siblings     Social History     Social History    Marital status: Divorced     Spouse name: N/A    Number of children: N/A    Years of education: N/A     Social History Main Topics    Smoking status: Never Smoker    Smokeless tobacco: Never Used    Alcohol use No    Drug use: No    Sexual activity: Not Asked     Other Topics Concern    None     Social History Narrative    None       Allergies:  No Known Allergies (drug, envir, food or latex)    Medications:    (Not in a hospital admission)   Current Outpatient Prescriptions   Medication    potassium chloride SA (KLOR-CON M20) 20 mEq  tablet    enoxaparin (LOVENOX) 100 mg/mL  injection    methadone (DOLOPHINE) 5 MG tablet    famotidine (PEPCID) 20 MG tablet    escitalopram (LEXAPRO) 20 MG tablet    LORazepam (ATIVAN) 0.5 MG tablet    amitriptyline (ELAVIL) 25 MG tablet    dexamethasone (DECADRON) 4 MG tablet    docusate sodium (COLACE) 100 MG capsule    pantoprazole (PROTONIX) 40 MG EC tablet    senna (SENOKOT) 8.6 MG tablet    PREMARIN 0.625 MG tablet    levothyroxine (SYNTHROID, LEVOTHROID) 137 MCG tablet    Vitamins - senior (CENTRUM SILVER) TABS    triamterene-hydrochlorothiazide (MAXZIDE-25) 37.5-25 MG per tablet    fluorouracil (ADRUCIL) 50 MG/ML injection    pegfilgrastim (NEULASTA) 6 MG/0.6ML injection syringe    metoclopramide (REGLAN) 5 MG tablet    Meds Exist in Therapy Plan    heparin 100 UNIT/ML injection    sodium chloride  0.9 % flush    ondansetron (ZOFRAN-ODT) 4 MG disintegrating tablet     Current Facility-Administered Medications   Medication Dose Route Frequency    Lactated Ringers Infusion  100 mL/hr Intravenous Continuous     Vitals:    02/19/17 0939   BP: 106/56   Pulse: 72   Resp: 18   Temp: 36.7 C (98.1 F)   Weight: 72.6 kg (160 lb)   Height: 170.2 cm (5\' 7" )       ROS:  UJ:[id number]    Physical Examination:  Head/Nose/Throat:negative  Lungs:Lungs clear  Cardiovascular:normal S1 and S2  Abdomen: abdomen soft, non-tender, nondistended, normal active bowel sounds, no masses or organomegaly      Lab Results: none    Radiology impressions (last 30 days):  No results found.    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 Updates to Patient's Condition (to be completed by a provider privileged to complete a H&P, following reassessment of the patient by the provider):    Full  H&P done today; no updates needed.    II. Procedure Readiness   I have reviewed the patient's H&P and updated condition. By completing and signing this form, I attest that this patient is ready for surgery/procedure.      III. Attestation   I have reviewed the updated information regarding the patient's condition and it is appropriate to proceed with the planned surgery/procedure.    Fonnie Jarvis, MD as of 10:23 AM 02/19/2017

## 2017-02-19 NOTE — Procedures (Signed)
Procedure Report    EUS-FNA Fiducial placement   Procedure Note   Date of Procedure: 02/19/2017    Referring Physician: No ref. provider found   Primary Care Provider: Celesta Aver, MD   Attending Physician: Fonnie Jarvis MD  Fellow: None  Indication(s): Pancreatic adenocarcinoma - uncinate process. Plan for fiducial placement for SBRT.   Discussed with patient in detail the risk and complications of EGD-EUS. Patient understands the complications and is agreeable with the procedure.    Medications: IV Fentanyl and Versed administered incrementally over the course of the procedure to achieve an adequate level of conscious sedation. IV Ciprofloxacin 400 mg once  Endoscope: Linear echoendoscope    Procedure Description: Full disclosure of risks were reviewed with patient as detailed on the consent form. The patient was placed in the left lateral decubitus position and monitored with continuous pulse oximetry and ECG tracing, interval blood pressure monitoring and direct observations. After adequate sedation, the endoscope was carefully introduced into the oropharynx and passed in to the esophagus under direct visualization.     EUS  The echoendoscope was advanced to the esophagus and no abnormal lymph nodes noted. The scope was easily advanced to the stomach. The body/tail of the pancreas revealed normal parenchyma. The pancreatic duct at the body/tail measured approximately 1-2 mm. The spleen was noted and revealed normal findings. The left lobe of the liver was examined which revealed normal findings.   The scope was advanced to the bulb of the duodenum which revealed head of the pancreas. The head of the pancreas imaged normal. The pancreatic duct was noted and measured 2 mm. The ampulla was noted which revealed normal appearance. The CBD was examined. No evidence of stone or sludge was noted. The CBD measured approximately 5 mm. No evidence of abnormal appearing lymph nodes were noted.    The scope was advanced  to the second portion and uncinate process along with ventral pancreas were visualized. Hazy hypoechoic area noted at the uncinate process adjacent to the SMA. Under fluorsoscopic vision and endoscopic vision, we placed 4 fiducials at the uncinate process mass with 22G FNA needle. The reason for placing 4 fiducials was 2 fiducials were deployed together and appeared as single fiducial.       Complication(s):   None    Impression(s):  Uncinate process mass - ill defined - Fiducials placed.       Recommendation(s):   Return to Radiation oncology.    Findings and suggested plans discussed with the patient and next   of kin prior to discharge.      I was present for the entire procedure including the moderate sedation portion.     Fonnie Jarvis, MD

## 2017-02-19 NOTE — Discharge Instructions (Signed)
GASTROENTEROLOGY UNIT  DISCHARGE INSTRUCTIONS FOR GASTROSCOPY EUS FNA      02/19/2017                11:46 AM         Do not drive, operate heavy machinery, drink alcoholic beverages, make important personal or business decisions, or sign legal documents until the next day.    Return to your usual medications   Return to your usual diet    Things You May Expect:   A mild sore throat (Warm liquids or lozenges will soothe the throat)   You were given medication to help you relax during the test. You need to rest at home for at least 4-6 hours.    You Should Call Your Doctor For Any of the Following:   Fever, abdominal or chest pain that does not go away   Cough or trouble breathing   Bloody or black stools.   Pain or redness at the IV site    If you have a serious problem after hours.  Call [phone] to reach the GI physician on call.    If you are unable to reach your doctor, go to the [facility] Emergency Department.    Follow Up Care:   Report will be sent to your primary doctor.    Return to Radiation oncology.    New Prescriptions    No medications on file

## 2017-02-20 DIAGNOSIS — C259 Malignant neoplasm of pancreas, unspecified: Secondary | ICD-10-CM | POA: Diagnosis present

## 2017-02-21 ENCOUNTER — Inpatient Hospital Stay: Admit: 2017-02-21 | Discharge: 2017-02-21 | Disposition: A | Discharge status: Home / Self Care | Payer: Self-pay

## 2017-02-21 ENCOUNTER — Ambulatory Visit: Payer: Medicare (Managed Care) | Admitting: Radiation Oncology

## 2017-02-21 ENCOUNTER — Ambulatory Visit: Payer: Medicare (Managed Care)

## 2017-02-21 DIAGNOSIS — C250 Malignant neoplasm of head of pancreas: Secondary | ICD-10-CM

## 2017-02-21 MED ORDER — HEPARIN LOCK FLUSH 10 UNIT/ML IJ SOLN WRAPPED *I*
50.0000 [IU] | Freq: Once | INTRAVENOUS | Status: AC
Start: 2017-02-21 — End: 2017-02-21
  Administered 2017-02-21: 50 [IU]

## 2017-02-21 MED ORDER — SODIUM CHLORIDE 0.9 % INJ (FLUSH) WRAPPED *I*
20.0000 mL | Freq: Once | Status: AC
Start: 2017-02-21 — End: 2017-02-21
  Administered 2017-02-21: 20 mL via INTRAVENOUS

## 2017-02-21 MED ORDER — IOVERSOL 74 % IV SOLN *I*
1.0000 mL | Freq: Once | INTRAMUSCULAR | Status: AC
Start: 2017-02-21 — End: 2017-02-21
  Administered 2017-02-21: 50 mL via INTRAVENOUS

## 2017-02-21 NOTE — Patient Instructions (Signed)
Discharge Instructions for Patients receiving   Contrast Medium    02/21/2017  11:11 AM    INFORMATION  I.V. contrast is eliminated through the kidneys. Side effects and allergic reactions to contrast mediums are not common but can occur up to 48 hours after your scan.    INSTRUCTIONS   1. You will need to drink four 8 oz glasses of fluid over the next four hours, unless your medical condition does not allow you to do this.  Please speak with your nurse if you have any questions or concerns about this.   2. A mild headache may occur following contrast injection.  3. The kidneys excrete the contrast.  Your urine will NOT become discolored.  4.  There is no evidence that the contrast you have received would be harmful to your breastfed child.  If you choose, you may pump and discard your breast milk for the next 24 hours.    Delayed effects from contrast are not common.  However, notify your Radiation doctor IMMEDIATELY if you have:    Nausea or vomiting   Any itching, hives, wheezing, or rashes    Severe or throbbing headache    PHONE NUMBERS for any questions or concerns:     Radiation Oncology:  [phone], Monday - Friday 8am-4:30pm     After 4:30pm and on weekends: Please call the page operator at  [phone] and ask to speak with the Radiation Oncologist on call     For medical emergencies: Call 911 or go to the nearest Emergency Department

## 2017-02-21 NOTE — Progress Notes (Signed)
Patient underwent simulation for SBRT for pancreatic cancer. Plan to start next week. 5 fractions.     Frederica Kuster MD  Assistant Professor of [place] [facility]

## 2017-02-21 NOTE — Progress Notes (Signed)
Patient received IV contrast for CT simulation today & tolerated well. No signs or symptoms of reaction or IV infiltration. Written discharge instructions given to & reviewed with patient. Patient verbalized good understanding of instructions.     Simulation and radiation teaching completed.  Department contact information given to patient.  Radiation to start 02/28/17 for 5 fractions to the pancreas.  Does not have a pacemaker.  Will not be receiving concurrent chemotherapy.      Is taking Decadron 4 mg one per day, managed by Dr. Sharlyn Bologna.      No pain, 7/10 fatigue.

## 2017-02-22 ENCOUNTER — Other Ambulatory Visit: Payer: Self-pay | Admitting: Hospice and Palliative Medicine

## 2017-02-22 ENCOUNTER — Other Ambulatory Visit: Payer: Self-pay

## 2017-02-22 NOTE — Telephone Encounter (Signed)
Daughter of pt. Called to request refill of Ativan/Lorazapam.  She has 4 days left.  Pharmacy is [facility]

## 2017-02-27 ENCOUNTER — Telehealth: Payer: Self-pay | Admitting: Hematology and Oncology

## 2017-02-27 MED ORDER — METHADONE HCL 5 MG PO TABS *I*
ORAL_TABLET | ORAL | 0 refills | Status: DC
Start: 2017-02-27 — End: 2017-08-20

## 2017-02-27 MED ORDER — LORAZEPAM 0.5 MG PO TABS *I*
0.5000 mg | ORAL_TABLET | Freq: Two times a day (BID) | ORAL | 0 refills | Status: DC | PRN
Start: 2017-02-27 — End: 2017-08-20

## 2017-02-27 MED ORDER — ENOXAPARIN SODIUM 100 MG/ML IJ SOSY *I*
100.0000 mg | PREFILLED_SYRINGE | Freq: Every day | INTRAMUSCULAR | 5 refills | Status: DC
Start: 2017-02-27 — End: 2017-08-20

## 2017-02-27 NOTE — Telephone Encounter (Signed)
lovenox refilled to [facility]

## 2017-02-27 NOTE — Telephone Encounter (Signed)
Refills appropriate for patient at this time. Dr. Brion Aliment is out. I will notify covering provider.

## 2017-02-27 NOTE — Telephone Encounter (Signed)
days left: 0  last appt:01/29/17  next appt:none  Pharm: [facility]    The Drug Utilization Report below displays all of the controlled substance prescriptions, if any, that your patient has filled in the last twelve months. The information displayed on this report is compiled from pharmacy submissions to the Department, and accurately reflects the information as submitted by the pharmacies.  This report was requested by: Mylinda Latina   Reference #: [id number]   [facility] Prescriptions  Patient Name: Amanda Galloway Birth Date: 16-Sep-1945   Address: [address] [place], [place] [address] Sex: Female   Rx Written Rx Dispensed Drug Quantity Days Supply Prescriber Name Payment Method Dispenser   01/29/2017 01/29/2017 methadone hcl 5 mg tablet  210 30 Quill, [place]. #06   01/01/2017 01/02/2017 hydromorphone 4 mg tablet  84 7 Quill, [place]. #06   01/01/2017 01/02/2017 methadone hcl 5 mg tablet  210 30 Quill, [place]. #06   11/30/2016 12/01/2016 methadone hcl 5 mg tablet  210 30 [place]ill, [place]. #06   11/30/2016 12/01/2016 lorazepam 0.5 mg tablet  60 30 Quill, [place]. #06   10/30/2016 11/02/2016 methadone hcl 5 mg tablet  210 30 Quill, [place]. #06   10/30/2016 11/02/2016 lorazepam 0.5 mg tablet  60 30 Quill, [place] #06   Others' Prescriptions  Patient Name: Amanda Galloway Birth Date: 05/03/1945   Address: [place], [place] [address] Sex: Female   Rx Written Rx Dispensed Drug Quantity Days Supply Prescriber Name Payment Method Dispenser   10/23/2016 10/29/2016 lorazepam 0.5 mg tablet  7 7 H[place], [place]. #06     Patient Name: Amanda Galloway Birth Date: 21-May-1945   Address: [place], [place] [address] Sex: Female   Rx Written Rx Dispensed Drug Quantity Days Supply Prescriber Name Payment Method Dispenser   10/18/2016 10/20/2016 methadone hcl 10 mg tablet  24 12 Hellems, Guinevere A [employer] [facility]   10/18/2016 10/20/2016 methadone hcl 5 mg tablet  36 12 Hellems, Guinevere A [employer] [facility]   10/13/2016 10/14/2016 methadone hcl 10 mg tablet  14 7 Sharyn Dross, [place] [facility]   10/13/2016 10/14/2016 hydromorphone 4 mg tablet  84 7 Sharyn Dross, [place] [facility]   10/13/2016 10/14/2016 methadone hcl 5 mg tablet  21 7 Sharyn Dross, [place] [facility]   10/13/2016 10/14/2016 lorazepam 0.5 mg tablet  7 7 Sharyn Dross, [place] [facility] [place]     Patient Name: Amanda Galloway Birth Date: [birth year]   Address: [place], [place] [address] Sex: Female   Rx Written Rx Dispensed Drug Quantity Days Supply Prescriber Name Payment Method Dispenser   09/29/2016 09/29/2016 hydromorphone 0.5 mg/0.5 ml  50ml 4 [place], Christian D MD No Payment [facility]     Patient Name: Amanda Galloway Birth Date: May 23, 1945   Address: [place], [place] [address] Sex: Female   Rx Written Rx Dispensed Drug Quantity Days Supply Prescriber Name Payment Method Dispenser   09/17/2016 09/17/2016 oxycodone-acetaminophen 5-325 mg tablet  12 3 Stingu, [place] #[id number]   09/14/2016 09/15/2016 hydrocodone-acetaminophen 5-325 mg tablet  20 5 Doreen Beam [place] [phone]   09/06/2016 09/07/2016 hydrocodone-acetaminophen 5-325 mg tablet  20 5 Doreen Beam [place] [phone]   09/04/2016 09/04/2016 hydrocodone-acetaminophen 5-325 mg tablet  15 4 Pennie Rushing [place] #[id number]   08/31/2016 08/31/2016 tramadol hcl 50 mg  tablet  60 20 [place], [place] [phone]   * - Drugs marked with an asterisk are compound drugs. If the compound drug is made up of more than one controlled substance, then each controlled substance will be a separate row in the table.

## 2017-03-01 ENCOUNTER — Ambulatory Visit: Payer: Medicare (Managed Care) | Admitting: Radiation Oncology

## 2017-03-01 VITALS — Wt 162.6 lb

## 2017-03-01 DIAGNOSIS — C250 Malignant neoplasm of head of pancreas: Secondary | ICD-10-CM

## 2017-03-02 NOTE — Progress Notes (Signed)
Radiation oncology weekly on-treatment visit note    Diagnosis:  Cancer Staging  Malignant neoplasm of head of pancreas  Staging form: Exocrine Pancreas, AJCC 8th Edition  - Clinical stage from 02/17/2017: Stage III (cT4, cN0, cM0) - Signed by , , MD on 02/17/2017    Radiation Plan: 25 Gy in 5 fractions pancreatic cancer, with concurrent boost to 30 Gy in 5 fractions to tumor around SMA.  Treatment Completed: 6 Gy   Concurrent Systemic Anti-Neoplastic Therapy: None  Port Film Reviewed: Yes     Treatment Course: 12/28- Feeling well. No issues.     Objective:   Performance Status: 1 - Symptomatic but completely ambulatory    Pain score   Pain    03/01/17 0855   PainSc:   0 - No pain         Wt 73.8 kg (162 lb 9.6 oz)    BMI 25.47 kg/m²    Wt Readings from Last 5 Encounters:   03/01/17 73.8 kg (162 lb 9.6 oz)   02/19/17 72.6 kg (160 lb)   02/12/17 71.6 kg (157 lb 14.2 oz)   02/01/17 73 kg (161 lb)   01/29/17 73.6 kg (162 lb 4.1 oz)       Pertinent Labs  Lab Results   Component Value Date    WBC 10.9 (H) 02/01/2017    HGB 9.4 (L) 02/01/2017    HCT 29 (L) 02/01/2017    MCV 110 (H) 02/01/2017    PLT 274 02/01/2017       Lab Results   Component Value Date    NA 140 02/01/2017    K 4.0 02/01/2017    CL 97 02/01/2017    CO2 25 02/01/2017    UN 25 (H) 02/01/2017    CREAT 1.01 (H) 02/01/2017    CA 8.9 02/01/2017    ALB 4.3 02/01/2017    TB 0.3 02/01/2017    ALK 147 (H) 02/01/2017    ALT 67 (H) 02/01/2017    AST 40 (H) 02/01/2017    GLU 111 (H) 02/01/2017

## 2017-03-06 ENCOUNTER — Ambulatory Visit: Payer: Medicare (Managed Care) | Admitting: Radiation Oncology

## 2017-03-06 ENCOUNTER — Ambulatory Visit: Payer: Medicare (Managed Care) | Attending: Radiation Oncology

## 2017-03-06 DIAGNOSIS — W5503XA Scratched by cat, initial encounter: Secondary | ICD-10-CM | POA: Insufficient documentation

## 2017-03-06 DIAGNOSIS — C250 Malignant neoplasm of head of pancreas: Secondary | ICD-10-CM | POA: Insufficient documentation

## 2017-03-06 DIAGNOSIS — C259 Malignant neoplasm of pancreas, unspecified: Secondary | ICD-10-CM | POA: Insufficient documentation

## 2017-03-06 DIAGNOSIS — R109 Unspecified abdominal pain: Secondary | ICD-10-CM | POA: Insufficient documentation

## 2017-03-06 DIAGNOSIS — Y929 Unspecified place or not applicable: Secondary | ICD-10-CM | POA: Insufficient documentation

## 2017-03-06 DIAGNOSIS — I2699 Other pulmonary embolism without acute cor pulmonale: Secondary | ICD-10-CM | POA: Insufficient documentation

## 2017-03-06 NOTE — Progress Notes (Signed)
Today was 3/5 radiation treatment to the pancreas.  Pt had On Treatment Visit today covered by Dr. Ron Parker.  All After Visit Summary Information was discussed with pt and daughter.  Both had good understanding of all that was said and had no further questions.  A printed AVS report was provided.  Pt will follow up With Dr. Trudie Buckler on 03/08/17.  Pt and daughter were encouraged to call in the future with any new questions or concerns.

## 2017-03-06 NOTE — Patient Instructions (Signed)
The following suggestions will help you manage any side effects and care for yourself after radiation therapy is completed. Side effects may persist or become worse over the next week and gradually resolve over the next few weeks.    1. Skin Care: If your skin is very dry or red, minimize the use of soap. Apply moisturizing lotion to the skin in the treatment field. You can return to your regular routine once the redness and dryness are gone. Let the radiation marks gradually wear off. Do not scrub. Once your skin reaction has completely healed, it is important to apply sunscreen (SPF 30 or higher) to any treated skin that is exposed to the sun. This will prevent a sunburn.  Please call if you develop any red, moist, peeling areas in the treatment field.    2. Diet: Please continue to eat a well-balanced diet. Good nutrition and hydration will help the normal cells of your body to repair themselves from the effects of the radiation.    3. Activity: If you feel tired, your energy level should gradually improve over the next several weeks. Plan to resume your normal lifestyle activities according to your energy level.     4. Medications: Per primary care physician and medical oncologist.     5. X-rays/Scans: Per primary care physician and medical oncologist.     6. Blood Work:  Per primary care physician and medical oncologist.     7. Smoking: PLEASE DO NOT SMOKE!  Multiple studies have shown that continued smoking significantly interferes with the healing process and also increases the risk of cancer returning after treatment, as well as the risk of new cancer developing in your lungs, throat, bladder and elsewhere.  Smoking also weakens your heart and makes it more difficult for you to breathe.  If you have never smoked, or you have quit, CONGRATULATIONS!  If you are still smoking, please talk to your primary care provider about medicine to help you quit.  Quitting smoking is the single most important thing you can  do to decrease your chances of cancer recurrence.      8. Follow-up Appointment: With Dr. Trudie Buckler on 06/20/17 at 1:00.     Please park in the ramp garage for all follow up appointments.     To learn more about community resources in your area, please call [phone] or e-mail [email or web address]. If you have any questions or concerns please call your Radiation Oncology Nurse at [phone].

## 2017-03-06 NOTE — Progress Notes (Signed)
Dose: 18/30 Gy     Site pancreas  Performance Status: Score-90- Able to carry on normal activity, minor signs or symptoms of disease.  Chart Reviewed: yes              Films Reviewed: yes  Chemotherapy None  Vitals:   Vitals:    03/06/17 1529   Weight: 72.3 kg (159 lb 8 oz)       S: Amanda Galloway has no complaint of pain  Fatigue: 0                 Weakness: no  Appetite: stable                  Nutrition:eats a balanced diet  Odynophagia or dysphagia: No complains  Nausea/Vomiting: Requiring antiemetics  Abdominal pain/discomfort: none        O: Weight: 72.3 kg (159 lb 8 oz)                         Skin: Gr 0: no notable skin changes                      Oropharynx: grade 0 -No symptoms      A/P: 1. RT: continue          2. Nutrition: continue current regimen          3. Pain Control: without any analgesics.          4. Anti emetics: Zofran  4 mg  Q8h          5. Skin Care: none          6. Blood work: routine

## 2017-03-06 NOTE — Progress Notes (Signed)
Pain    03/06/17 1529   PainSc:   0 - No pain       [place] Performance Status: 90% - Able to carry out normal activity, minor signs or symptoms of disease    Fatigue: 7 - Severe fatigue    Safety/Protective Mechanisms  Two Patient Identifiers Confirmed?: Yes    Fall Risk  RN assessed patient for risk of falls: Yes  Criteria for Risk of Falls: None     Completed 3/5 fractions, 1800/3000 Gy    ALERT AND ORIENTED X 3:  yes    CRAMPING:  no    SKIN REACTION     no     SKIN SENSATION   no     Nausea:  Yes, with onset of radiation.  Is taking Zofran prior to treatment, which is effective.      CHEMO:  Not at this time.  May begin again, depending on biopsy results.        COMMENT:  Accompanied by supportive daughter, in good spirits.

## 2017-03-08 ENCOUNTER — Ambulatory Visit: Payer: Self-pay | Admitting: Radiation Oncology

## 2017-03-08 ENCOUNTER — Encounter: Payer: Self-pay | Admitting: Radiation Oncology

## 2017-03-11 ENCOUNTER — Encounter: Payer: Self-pay | Admitting: Surgical Oncology

## 2017-03-11 ENCOUNTER — Ambulatory Visit: Payer: Medicare (Managed Care) | Admitting: Surgical Oncology

## 2017-03-11 VITALS — BP 132/62 | HR 92 | Temp 97.5°F | Resp 14 | Ht 67.0 in | Wt 160.9 lb

## 2017-03-11 DIAGNOSIS — I2699 Other pulmonary embolism without acute cor pulmonale: Secondary | ICD-10-CM

## 2017-03-11 DIAGNOSIS — R109 Unspecified abdominal pain: Secondary | ICD-10-CM

## 2017-03-11 DIAGNOSIS — C259 Malignant neoplasm of pancreas, unspecified: Secondary | ICD-10-CM

## 2017-03-11 NOTE — Patient Instructions (Addendum)
[facility]   Amanda Galloway     SURGERY DATE:   Friday Janurary 25th, 2019     1. On the [place] [phone] between 3 - 7 PM to find out:   a) the time to arrive at the [facility] and   b) the time of your procedure.    Please note surgery start time is approximate. You may want to bring something to help pass the time. PLEASE ARRIVE ON TIME.         2. DIRECTIONS TO [facility]: On the day of your procedure  park in the parking garage and take the elevator/stairs to Level One (1), then follow the walkway to the Main Lobby.  Walk past the Information Desk in the lobby, towards the Lab & Outpatient Services.  Follow the RED (R) ceiling tags to the RED elevators. (Valet parking is available outside the front entrance of the hospital between 6:00 AM and 5:00 PM.)    [place] (B-Level): Take the RED elevators to the Basement (Level B - Two floors down). Follow signs  to the [facility] and check in with the receptionist at the desk.    RED ELEVATORS - BASEMENT - Follow PINK STRIPE on wall to Surgical Center/Waiting Room       3. Before coming to the hospital, remove all makeup, (including mascara), jewelry (including wedding band and watch), hair accessories and nail polish from toes and fingers. Do not bring any valuables (money, wallet, purse, jewelry, or contact lenses).   Bring Photo ID   HCP       4. Call your surgeon if you become ill before the procedure day, i.e. fever, chills, nausea, vomiting, sore throat.       5. EATING GUIDELINES: Follow the instructions below unless directed by your surgeon. Starting evening prior to surgery     Nothing to eat after midnight on the day of your surgery. No candy,gum, mints.    You can have clear liquids up to 4 hours before your surgery. This includes water, apple juice, clear carbonated beverages, black coffee, clear tea. No milk, cream, or non dairy creamers.   Failure to  follow these instructions, could lead to a delay or cancellation of your procedure.  EATING GUIDELINES: Follow the instructions below unless directed by your surgeon. Starting:   Regular diet day before surgery  Take colace twice a day a week prior to surgery.   Gatorade or apple juice (16 oz) at 10 pm before bed  Nothing to eat after midnight on the day of your surgery. No candy,gum, mints.    Impact/ensure or boost: drink 3 (8 ox) cartons each day for 5 dys prior to surgery in addition to your normal diet     Failure to follow these instructions, could lead to a delay or cancellation of your procedure.       Shower/bathe with antibacterial soap/body wash evening before surgery or morning of surgery.          6. MEDICATIONS:    On the morning of surgery, take only the medications listed below at the usual time or before leaving for the  hospital:     Protonix  Levothyroxine     Medications should only be taken with no more than one ounce of water.  You may take Tylenol (Acetaminophen) if needed.    Aspirin: Do not take any aspirin products for 5 days before the  procedure date.  Anti-Inflammatory products: Do not take any non-steroidal anti-inflammatory agents such as Ibuprofen (Advil, Motrin) or Naproxen (Aleve) 5 days before the procedure.        7.  EXPECTED STAY:    (SDA) Admission: You are being admitted to the hospital after surgery for approximately 5-7 days.  Leave any luggage in your car until after your procedure.  Then your family can bring into the hospital once you are in your room.    Health standards require that a responsible adult must accompany any patient who has received anesthetics or sedation and is going home the same day.   ? You must arrange a ride home before coming to surgery.         48. Your family will be directed to a waiting area when you are taken to surgery.  ? We ask that only one or two family members accompany you on the day of your procedure.         ? No children under the age  of 53 are allowed as visitors in [place]  ? Your family will be notified when your surgery is completed and you have arrived on the patient care unit.      Proper Handwashing is simple - here's all you have to do:    When using soap & water:  The following steps only take 15 to 20 seconds. To time yourself sing the Happy Birthday song twice.   1. Wet your hands and forearms. Put some soap in your palm and rub your palms together.    2. Next, rub your right palm over the left hand, then your left palm over the right hand.    3. Rub your hands together palm to palm, with your fingers interlaced.    4. Rub the backs of your fingers up against the opposing fingers. Make sure to clean under and around your fingernails.    5. Hold your right thumb with your left hand and rotate. Do the same with the opposite hand.    6. Rub your right palm with fingertips of your left hand, then your left palm with the fingertips of your right hand.    When using hand gel:   1. Use enough hand gel or foam to completely cover your hands.      2. Rub your hands together, palm to palm.    3. Rub the back of each hand with the palm of the other hand.      4. Spread the sanitizer  over and under  your fingernails.        5. Spread the gel  between your fingers.    6. keep rubbing your  hands together  until they are dry.      7. Do not rinse your hands or dry them with a towel.        Common Germs:   Influenza (Flu):  Easily passed from person to person through coughing or sneezing (if the nose and mouth are not covered).   Staph Aureus/MRSA:  Can cause a wide range of Illnesses from minor skin infections to life threatening conditions such as pneumonia, blood stream infections and toxic shock syndrome. Most often passed on by direct contact with someone who has the germ.   Salmonella:  Found in feces of infected person or animal. Passed  through uncooked food, un-washed vegetables and contaminated utensils.   E. Coli:  Found on  animals, animal parts and  obejects that have been in contact with animals. Often passed through contaminated food.       Surgical Site Infections FAQs    What is a Surgical Site infection (SSI)?  A surgical site infection is an infection that occurs after surgery In the part of the body where the surgery took place. Most patients who have surgery do not develop an infection. However, Infections develop in about 1 to 3 out of every 100 patients who have surgery.     Some of the common symptoms of a surgical site infection are:    Redness and pain around the area where you had surgery    Drainage of cloudy fluid from your surgical wound    Fever     Can SSIs be treated?   Yes. Most surgical site infections can be treated with antibiotics. The antibiotic given to you depends on the bacteria (germs) causing the Infection. Sometimes patients with SSIs also need another surgery to treat the infection.     What are some of the things that hospitals are doing to prevent SSls?   To prevent SSIs, doctors, nurses, and other healthcare providers:    Clean their hands and arms up to their elbows with an antiseptic agent just before the surgery.    Clean their hands with soap and water or an alcohol-based hand rub before and after caring for each patient.    May remove some of your hair Immediately before your surgery using electric clippers If the hair Is in the same area where the procedure will occur. They should not shave you with a razor.    Wear special hair covers, masks, gowns, and gloves during surgery to keep the surgery area clean.    Give you antibiotics before your surgery starts. in most cases, you should get antibiotics within 60 mInutes before the surgery starts and the antibiotics should be stopped within 24 hours after surgery.    Clean the skin at the site of your surgery with a special soap that kills germs.     What can I do to help prevent SSIs?   Before your surgery:    Tell your doctor about  other medical problems you may have. Health problems such as allergies, diabetes, and obesity could affect your surgery and your treatment.    Quit smoking. Patients who smoke get more Infections. Talk to your doctor about how you can quit before your surgery.    Do not shave near where you will have surgery. Shaving with a razor can Irritate your skin and make it easier to develop an infection.   At the time of your surgery:    Speak up if someone tries to shave you with a razor before surgery. Ask why you need to be shaved and talk with your surgeon if you have any concerns.    Ask if you will get antibiotics before surgery.   After your surgery:    Make sure that your healthcare providers clean their hands before examining you, either with soap and water or an alcohol-based hand rub,      If you do not see your healthcare providers wash their hands,   please ask them to do so.     Family and friends who visit you should not touch the surgical wound or dressings.    Family and friends should clean their hands with soap and water or an alcohol-based hand rub before and after visiting you. If you do not see  them clean their hands, ask them to clean their hands.   What do I need to do when I go home from the hospital?    Before you go home, your doctor or nurse should explain everyt hing you need to know about taking care of your wound. Make sure you understand how to care for your wound before you leave the hospital.    Always clean your hands before and after caring for your wound.    Before you go home, make sure you know who to contact If you have questions or problems after you get home.    If you have any symptoms of an Infection, such as redness and pain at the surgery site, drainage, or fever, call your doctor immediately.   if you have additional questions, Please ask your doctor or nurse.       January 2019      Sunday Monday Tuesday Wednesday Thursday Friday Saturday             1     2    ON  TREATMENT VISIT (HPA)   2:30 PM   (15 min.)   Katz, Alan, MD   [facility]    RADIATION TREATMENT (HPA)   2:30 PM   (5 min.)   RAD ONC, TREATMENT   [facility] 3     4     5       6     7    FOLLOW UP VISIT   2:30 PM   (30 min.)   Galka, Eva, MD   [facility] 8     9     10     11     12       13     14     15     16     17     18     19       20     21     22     23     24  DO NOT TAKE AFTERNOON   LOVENOX   25  WHIPPLE PROCEDURE   7:30 AM   Galka, Eva, MD   [facility] MAIN OR 26       27     28     29     30     31                          Conventional Pancreaticoduodenectomy (Whipple Procedure)

## 2017-03-11 NOTE — Progress Notes (Signed)
[facility]-GI Surgery Follow up note    Chief Complaint  Amanda Galloway is a 72 y.o. female who presents  on 03/11/2017 for [facility]B-GI follow-up care.     Diagnosis  Locally advanced Pancreatic adenocarinoma    Physician Team  Surgeon:   Delano Metz, MD  PCP:  Celesta Aver, MD  Gastroenterologist: Fonnie Jarvis, MD  Medical Oncology: Calton Dach, MD   Radiation Oncologist: Durene Romans, MD    Interval History  She presents today with supportive daughter. She is doing well and has complete radiation therapy this past Friday. Her appetite is good and weight is stable. Does report she is fatigue but able to carry on with activities. Pain is well controlled now. Using methadone schedule and dilaudid PRN for pain. Having regular bowel movements with no evidence of steatorrhea or melena. Voiding without difficulty.     Oncologic History   Over several months, patient noted worsening abdominal pain and weight loss. Extensive work-up including EGD, colonoscopy and imaging were unrevealing. After suggestive HIDA scan, she underwent cholecystectomy on September 06, 2016 but symptoms persisted.      MRCP on September 25, 2016 demonstrated2x2cm soft tissue mass-like abnormality in celiac region, contiguous with medial margin of uncinate process of pancreas concerning for pancreatic lession vs lymphadenopathy.     EUS/FNA on September 27, 2016 demonstrated a mass in pancreas uncinate process and lymph node pressing celiac axis. FNA confirmed adenocarcinoma.      She was transferred to [facility] for additional management. Abd/pelvis CT on September 30, 2016 revealed ill-defined prominent retroperitoneal soft tissue with central low attenuation suggestive of necrosis which abuts SMA and is adjacent to pancreatic uncinate process similar to prior MRI. Differential includes pancreatic neoplasm versus lymphadenopathy. CT angio on September 29, 2016 showed no pulmonary embolism. Nonspecific 3 mm nodules in right upper and middle lobe, recommend attention on  follow-up imaging.      Diagnostic laparoscopy and port placement by Dr. Ron Agee on October 04, 2016 - no evidence of metastatic disease.     FOLFIRINOX chemotherapy - treatment start date 10/05/2016.  Completed 8 cycles.     Neoadjuvant SBRT 25 Gy in 5 fractions pancreatic cancer, with concurrent boost to 30 Gy in 5 fractions to tumor around SMA from 12/128/18-03/08/17      Medical History  Past Medical History:   Diagnosis Date    Cancer     Depression     Fibromyalgia     Hypothyroidism      Surgical History  Past Surgical History:   Procedure Laterality Date    CHOLECYSTECTOMY      CHOLECYSTECTOMY, LAPAROSCOPIC  09/06/2016    HYSTERECTOMY  08/1986    Fibroids    KNEE SURGERY Right     PR INSERT TUNNELED CV CATH WITH PORT Right 10/04/2016    Procedure: Right IJ MEDIPORT Insertion;  Surgeon: Delano Metz, MD;  Location: [facility] MAIN OR;  Service: Oncology General    PR LAP,DIAGNOSTIC ABDOMEN N/A 10/04/2016    Procedure: LAPAROSCOPY DIAGNOSTIC;  Surgeon: Delano Metz, MD;  Location: [facility] MAIN OR;  Service: Oncology General    ROTATOR CUFF REPAIR Right     TONSILLECTOMY AND ADENOIDECTOMY       Family History  Family History   Problem Relation Age of Onset    Breast cancer Mother         died 73    Cancer Father         NHL, died 44    Heart Disease Father  Diabetes Sister     Obesity Sister         4 siblings     Social History  Social History     Social History    Marital status: Divorced     Spouse name: N/A    Number of children: N/A    Years of education: N/A     Occupational History    Not on file.     Social History Main Topics    Smoking status: Never Smoker    Smokeless tobacco: Never Used    Alcohol use No    Drug use: No    Sexual activity: Not on file     Social History Narrative    No narrative on file       Medications  Outpatient Encounter Prescriptions as of 03/11/2017   Medication Sig Dispense Refill    amitriptyline (ELAVIL) 25 MG tablet Take 1 tablet (25 mg total) by mouth nightly 30  tablet 5    dexamethasone (DECADRON) 4 MG tablet Take 1 tablet (4 mg total) by mouth 2 times daily (Patient taking differently: Take 4 mg by mouth daily   ) 60 tablet 5    docusate sodium (COLACE) 100 MG capsule Take 2 capsules (200 mg total) by mouth 2 times daily 120 capsule 5    LORazepam (ATIVAN) 0.5 MG tablet Take 1 tablet (0.5 mg total) by mouth 2 times daily as needed for Anxiety   Max daily dose: 1 mg 60 tablet 0    methadone (DOLOPHINE) 5 MG tablet Take 20mg  in the morning, 20mg  in afternoon and 15mg  at night. 195 tablet 0    enoxaparin (LOVENOX) 100 mg/mL injection Inject 1 Syringe (100 mg total) into the skin daily 30 Syringe 5    potassium chloride SA (KLOR-CON M20) 20 mEq  tablet Take 20 mEq by mouth 2 times daily      fluorouracil (ADRUCIL) 50 MG/ML injection Administer 3,477 mg into the vein every 14 days   Start in infusion center.  Infuse over 46 hours at home. 3477 mg 2    pegfilgrastim (NEULASTA) 6 MG/0.6ML injection syringe Inject 0.6 mLs (6 mg total) into the skin once 0.6 mL 3    famotidine (PEPCID) 20 MG tablet Take 1 tablet (20 mg total) by mouth 2 times daily 60 tablet 3    escitalopram (LEXAPRO) 20 MG tablet Take 1 tablet (20 mg total) by mouth daily 30 tablet 3    metoclopramide (REGLAN) 5 MG tablet Take 1 tablet (5 mg total) by mouth 3 times daily (before meals) 90 tablet 5    pantoprazole (PROTONIX) 40 MG EC tablet Take 1 tablet (40 mg total) by mouth 2 times daily (before meals)   Swallow whole. Do not crush, break, or chew. 60 tablet 5    senna (SENOKOT) 8.6 MG tablet Take 2 tablets by mouth 2 times daily 120 tablet 5    Meds Exist in Therapy Plan Peg-Filgrastim      heparin 100 UNIT/ML injection 5 mLs (500 Units total) by Intracatheter route as needed (for line care) 30 Syringe 5    sodium chloride 0.9 % flush 10 mLs by Intracatheter route as needed (for line care) 600 mL 5    PREMARIN 0.625 MG tablet       ondansetron (ZOFRAN-ODT) 4 MG disintegrating tablet Take 1  tablet (4 mg total) by mouth 3 times daily as needed for Nausea   Place on top of tongue. 60 tablet 2  levothyroxine (SYNTHROID, LEVOTHROID) 137 MCG tablet Take 1 tablet (137 mcg total) by mouth daily (before breakfast) 30 tablet 0    Vitamins - senior (CENTRUM SILVER) TABS Take 1 tablet by mouth daily 30 tablet 0    triamterene-hydrochlorothiazide (MAXZIDE-25) 37.5-25 MG per tablet Take 1 tablet by mouth daily 30 tablet 0     No facility-administered encounter medications on file as of 03/11/2017.       Allergies  No Known Allergies (drug, envir, food or latex)  Review of Systems  Review of Systems   Constitutional: Positive for malaise/fatigue. Negative for chills, fever and weight loss.   HENT: Negative for hearing loss.    Eyes: Negative for blurred vision.   Respiratory: Negative for cough and shortness of breath.    Cardiovascular: Negative for chest pain, palpitations and leg swelling.   Gastrointestinal: Positive for nausea. Negative for abdominal pain, blood in stool, constipation, diarrhea, heartburn and vomiting.   Genitourinary: Negative for frequency.   Skin: Negative for rash.   Neurological: Negative for weakness.   Endo/Heme/Allergies: Does not bruise/bleed easily.   Psychiatric/Behavioral: Negative for memory loss.     Physical Exam  BP 132/62    Pulse 92    Temp 36.4 C (97.5 F)    Resp 14    Ht 170.2 cm (5\' 7" )    Wt 73 kg (160 lb 15 oz)    SpO2 100%    BMI 25.21 kg/m   Physical Exam   Constitutional: She is oriented to person, place, and time. She appears well-developed and well-nourished. No distress.   HENT:   Head: Normocephalic.   Mouth/Throat: No oropharyngeal exudate.   Eyes: Pupils are equal, round, and reactive to light. Conjunctivae and EOM are normal. Right eye exhibits no discharge. Left eye exhibits no discharge. No scleral icterus.   Neck: Normal range of motion.   Cardiovascular: Normal rate, regular rhythm, normal heart sounds and intact distal pulses.  Exam reveals no gallop  and no friction rub.    No murmur heard.  Pulmonary/Chest: Effort normal and breath sounds normal. No respiratory distress. She has no wheezes. She has no rales.   Abdominal: Soft. Bowel sounds are normal. She exhibits no distension and no mass. There is no tenderness. There is no rebound and no guarding.   Musculoskeletal: She exhibits no edema.   Lymphadenopathy:     She has no cervical adenopathy.   Neurological: She is alert and oriented to person, place, and time.   Skin: Skin is warm and dry. No rash noted. She is not diaphoretic. No erythema. No pallor.   Psychiatric: She has a normal mood and affect. Her behavior is normal. Judgment and thought content normal.     Laboratory Data      Lab results: 02/01/17  1150   WBC 10.9*       No components found with this basename:  HGB,  HCT,  PLT,  CEA,  CA19-9  Pathology  Incoming Consult Med Cyto       Collected: 10/18/2016 10:35 AM         FINAL DIAGNOSIS:   Outside material received from [facility] [place] for consultation.     Cytology Consultation:   Pancreas, head, mass, fine needle aspiration ([id number], 09/27/2016):     Atypical cells suspicious for adenocarcinoma. See comment.     Comment: Limited cells of interest are present for evaluation. Clinical correlation is recommended.     Imaging Data  Ct Abdomen And Pelvis With Contrast  Result Date: 01/18/2017  Stable treated Pancreatic carcinoma (uncinate process). Heterogeneous uncinate process without discrete measurable tumor. Resolution of original regional lymphadenopathy. Small amount of residual soft tissue extending to the SMA. No new metastases. Altogether stable compared to September 2018 and is improved (partial response) compared to July 2018.     Ct Chest With Contrast  Result Date: 01/18/2017  1.  Filling defect identified in a segmental/subsegmental right lower lobe pulmonary artery consistent with an embolus.   2.  Small scattered pulmonary nodules noted, measuring 2  mm in the right middle lobe and in the left lower lobe measuring 2 mm, not significantly changed when compared with prior imaging. Continued attention on follow-up imaging recommended.   3.  No clearly enlarged lymph nodes by CT size criteria. Findings discussed with Gena Fray via phone at 3:05 pm on 01/18/2017. Please see the separately dictated CT abdomen and pelvis imaging report for detailed findings below the level the diaphragm.       Assessment  Deeanna Beightol is a 72 y.o. female with locally advanced pancreatic adenocarcinoma. She completed 8 cycles of FOLFIRINOX on 01/11/17 with restaging scans showing stable disease / partial response. She has also complete SBRT and tolerated well, here to discuss surgical planning.     We dicussed she will undergo pancreatoduodenectomy (whipple surgery). We  Discussed the risks, benefits, and alternatives for surgery including bleeding, infection, blood clots, injury to adjacent structures, pancreatic leak (15%), delayed gastric emptying, pancreatic exocrine insufficiency, worsening glucose tolerance, need for additional treatment/procedures, recurrence and the risks of general anesthetic including MI, CVA, sudden death or even reaction to anesthetic medications. The patient understands the risks, any and all questions were answered to the patient's satisfaction.    Plan  1. Scheduled for surgery on 03/29/17.   2.Consent reviewed, preoperative teaching completed, patient verbalizes understanding of instructions, he does not object to blood transfusion, preoperative orders completed, labs to be repeated: T&S, prealbumin, PT/INR, CMP, CBC, patient to discontinue ASA/NSAIDS 7 days prior to surgery, Hold lovenox day prior to surgery.   3. Whipple ERAS protocol reviewed.    4. Will bring completed HCP day of surgery  5. Impact nutritional supplement prescribed    Rodrigo Ran, NP      I saw, examined and evaluated the patient, reviewed the patient chart and the relevant images  and lab findings. Rodrigo Ran, NP has acted as a Education administrator for this encounter at this office visit.      72 year old female with borderline resectable pancreatic cancer who underwent neoadjuvant chemotherapy. She tolerated chemotherapy well and has a visible response in her tumor with no evidence of recurrence. Her tumor marker was initially well over 800 and has normalized at 37. She has had continued abdominal pain which has been treated with methadone which she still requires but has given good control.      I reviewed her imaging and discussed the recommendations to proceed with resection. We reviewed pancreaticoduodenectomy. The procedure, post operative course and possible complications. Hospital stay should be 5-7 days barring any major complications. Enrollment in an enhanced recovery after surgery program was also discussed with goals to improve outcome and recovery.     We have planned for surgery in two weeks to encourage further recovery from chemotherapy and made exercise and nutritional recommendations for this time. We will keep you posted.       I formulated the assessment plan and directed the care  of this patient. My detailed recommendations and opinion are documented in the above edited note which reflect the management plans. The patient is agreeable with this plan.    Delano Metz, MD, FACS  Associate Professor of Surgery  Division of Hepatobiliary, Pancreas, & GI Surgery

## 2017-03-12 NOTE — Invasive Procedure Plan of Care (Addendum)
Invasive Procedure Plan of Care (Consent Form 419):   Condition(s) Addressed: Locally advance pancreas cancer   Performing Provider: Isidor Bromell   Side: Not applicable    Procedure: Pancreatoduodenectomy (whipple surgery)   Special Equipment:    Planned Anesthesia: General, Spinal and No Anesthesia Team Needed   Benefits: Possible cure   Risks:  bleeding, infection, blood clots, injury to adjacent structures, pancreatic leak (15%),, delayed gastric emptying, pancreatic exocrine insufficiency, worsening glucose tolerance, need for additional treatment/procedures, recurrence and the risks of general anesthetic including MI, CVA, sudden death or even reaction to anesthetic medications.   Alternatives: More chemo, no surgery   Expected Length of Stay: 5 day(s)     I, or a designated member of my surgical team, have discussed the planned procedure, including the potential for any transfusion of blood products or receipt of tissue as necessary, expected benefits, the potential complications and risks and possible alternatives and their benefits and risks with the patient or the patient's surrogate. In my opinion, the patent or the patient's surrogate understands the proposed procedure, its risks, benefits, and alternatives.    Electronically signed by Delano Metz, MD at 12:29 AM     Patient Consent:  I hereby give my consent and authorize Deakon Frix  (The list of possible assistants, all of whom are privileged to provide surgical services at the hospital, is available)  To treat the following: Locally advance pancreas cancer  Procedure includes: Pancreatoduodenectomy (whipple surgery)  Laterality: Not applicable  1 The care provider has explained my condition to me, the benefits of having the above treatment procedure, and alternate ways of treating my condition. I understand that no guarantees have been made to me about the result of the treatment. The alternatives to this procedure include: More chemo, no surgery   2 The  care provider has discussed with me the reasonably foreseeable risks of the treatment and that there may be undesirable results. The risks that are specifically related to this procedure include:  bleeding, infection, blood clots, injury to adjacent structures, pancreatic leak (15%),, delayed gastric emptying, pancreatic exocrine insufficiency, worsening glucose tolerance, need for additional treatment/procedures, recurrence and the risks of general anesthetic including MI, CVA, sudden death or even reaction to anesthetic medications.   3 I understand that during the treatment a condition may be discovered which was not known before the treatment started. Therefore, I authorize the care provider to perform any additional or different treatment which is thought necessary and available.   4 Any tissue, parts, or substances removed during the procedure may be retained or disposed of in accordance with customary scientific, educational and clinical practice.   5 Vendor information if appropriate:    6 If blood products are needed, I would agree: Yes   7 If tissue products are needed, I would agree: Yes    8 Blood/Tissue use limitations and/or exclusions:       I have carefully read and fully understand this informed consent form, and have had sufficient opportunity to discuss my condition and the above procedure(s) with the care provider and his/her associates, and all of my questions have been answered to my satisfaction. I understand that my surgeon/provider performing the procedure may not be physically present in the operating/procedure room the entire time that I am there. My surgeon/provider has answered my questions regarding this and how it may relate to my surgery/procedure. I agree to the Plan of Care as outlined above.     With respect to  receiving blood and/or blood products, I AGREE TO BLOOD TRANSFUSION except where limitations and/or exclusions have been noted above. I have had a chance to discuss the  risks, benefits and alternatives regarding transfusion and/or receipt of tissues (as above) with my healthcare provider. My decision(s) regarding the transfusion of blood or blood components and/or the receipt of tissue are as above. I understand this covers my perioperative/periprocedural (before, during, and after the surgery/procedure) course of treatment and my entire hospitalization.  I understand that if my condition changes significantly during my postoperative course that my physicians will discuss the risks and benefits of transfusion with me again at that time. I may be asked to provide a secondary consent to receive or refuse blood transfusion.                    Patient Signature   (or Parent/Legal Guardian if pt is unable to sign or is a minor)  Date/Time     Electronic Signatures will display at the bottom of the consent form.    Your doctor or someone your doctor has appointed has told you that you may need blood or a blood product transfusion, which has been collected from volunteers, as part of your treatment as a patient.    The reasons you might need blood or blood products include, but are not limited to:    Significant loss of your own blood   Your body may not be getting enough oxygen to its tissues    Treatment of bleeding disorders caused by low platelet counts or platelets that do not work right (platelets are part of a cell that helps to form clots and keeps you from bleeding too much).   You may not have enough of other substances that help your blood clot or stop you from bleeding more  The risks of getting a transfusion of blood or blood products include, but are not limited to:    Damage to the lungs   Difficulty breathing due to fluid in the lungs   The product may contain bacteria or rarely a virus (which includes HIV and Hepatitis)   Blood from the community blood supply has been collected from volunteer donors who have been screened for health risk. The blood has been tested  for major blood transmitted disease, but no transfusion is 100% safe. The blood is tested with very sensitive and accurate tests to screen for hepatitis, AIDS, and other disease, which makes the risks very small.   You may have side effects from the transfusion (rash, fever, chills) or an allergic reaction   The transfusion increases your risks of getting infection or cancer coming back   The transfusion can increase the time you have to stay in the hospital   The transfusion can potentially cause death if the wrong blood is given or your body rejects the blood   Before blood is transfused, it is tested again to make sure it is the correct type  There are other options than getting blood or blood products from other people and they include:    Drugs which can decrease bleeding   Drugs which can cause your body to make more blood (used in elective procedures with advance notice)   Autologous (your own blood) donation (needs pre-arrangement)   No transfusion  If you exercise your right to refuse to be transfused with blood or blood products; these things listed below, among others, could happen to you:   Your body may  not get enough oxygen and suffer damage   You may have a higher chance of bleeding   You may limit other options for your condition   You may die from losing too much blood

## 2017-03-14 NOTE — Progress Notes (Addendum)
Radiation Oncology Treatment Summary    Patient ID: Amanda Galloway is a 72 y.o. female with a history of locally advanced pancreatic adenocarcinoma. She completed 8 cycles of FOLFIRINOX on 01/11/17 with restaging scans showing stable disease / partial response. She was referred for SBRT prior to possible surgical resection. She received 25 Gy in 5 fractions pancreatic cancer, with concurrent boost to 30 Gy in 5 fractions to tumor around SMA, completed on 03/08/17. Details of treatment as summarized below.     Diagnosis: Cancer Staging  Malignant neoplasm of head of pancreas  Staging form: Exocrine Pancreas, AJCC 8th Edition  - Clinical stage from 02/17/2017: Stage III (cT4, cN0, cM0) - Signed by Derrel Nip, MD on 02/17/2017    Brief Oncologic History:    1. Over several months, patient noted worsening abdominal pain and weight loss. Extensive work-up including EGD, colonoscopy and imaging were unrevealing. After suggestive HIDA scan, she underwent cholecystectomy on September 06, 2016 but symptoms persisted.     2. MRCP on September 25, 2016 demonstrated2x2cm soft tissue mass-like abnormality in celiac region, contiguous with medial margin of uncinate process of pancreas concerning for pancreatic lession vs lymphadenopathy.    3. EUS/FNA on September 27, 2016 demonstrated a mass in pancreas uncinate process and lymph node pressing celiac axis. FNA confirmed adenocarcinoma.    4. She was transferred to [facility] for additional management. Abd/pelvis CT on September 30, 2016 revealed ill-defined prominent retroperitoneal soft tissue with central low attenuation suggestive of necrosis which abuts SMA and is adjacent to pancreatic uncinate process similar to prior MRI. Differential includes pancreatic neoplasm versus lymphadenopathy. CT angio on September 29, 2016 showed no pulmonary embolism. Nonspecific 3 mm nodules in right upper and middle lobe, recommend attention on follow-up imaging.     5. Diagnostic laparoscopy and port placement  by Dr. Ron Agee on October 04, 2016 - no evidence of metastatic disease.    6. FOLFIRINOX chemotherapy, 10/05/16 - 01/11/17 (8 cycles)     7. 03/01/17 - 03/08/17: Radiation therapy, 25 Gy in 5 fractions pancreatic cancer, with concurrent boost to 30 Gy in 5 fractions to tumor around SMA     Her treatment summary is as follows.    Treatment Dates: 03/01/17 - 03/08/17    Treatment Technique: 25 Gy in 5 fractions pancreatic cancer, with concurrent boost to 30 Gy in 5 fractions to tumor around SMA (SBRT)     Plan ID Energy Fractions Dose per Fraction (cGy) Dose Correction (cGy) Total Dose Delivered (cGy) Elapsed Days   Pancreas_BH 6X 5 / 5 600 0 3,000 7     Treatment Machine: Linac 2      Image Guidance: CBCT      Treatment Summary: The patient underwent CT-based treatment planning.  Marks were placed on the patient to assist in setup. After the target volume and organs at risk were delineated, the patient was planned using a treatment planning system. Beams were shaped with MLCs and/or field wedging as needed to improve conformality of the target coverage.  After review of the dosimetry, the plan was signed by the attending physician and the patient started treatment.     Treatment Course: Hildegard Hlavac completed treatment as prescribed with no breaks in treatment and no severe toxicities. She experienced expected fatigue as well as nausea managed with Zofran.     Disposition: We will follow up with Tad Moore in 3 months; imaging per medical oncology. The patient is welcome to contact us with any issues or questions  in the interval period.     Frederica Kuster MD  Assistant Professor of [place] [facility]

## 2017-03-14 NOTE — H&P (Signed)
HPB-GI Surgery Follow up note    Chief Complaint  Amanda Galloway is a 72 y.o. female who presents  on 03/11/2017 for HPB-GI follow-up care.     Diagnosis  Locally advanced Pancreatic adenocarinoma    Physician Team  Surgeon:   Delano Metz, MD  PCP:  Celesta Aver, MD  Gastroenterologist: Fonnie Jarvis, MD  Medical Oncology: Calton Dach, MD   Radiation Oncologist: Durene Romans, MD    Interval History  She presents today with supportive daughter. She is doing well and has complete radiation therapy this past Friday. Her appetite is good and weight is stable. Does report she is fatigue but able to carry on with activities. Pain is well controlled now. Using methadone schedule and dilaudid PRN for pain. Having regular bowel movements with no evidence of steatorrhea or melena. Voiding without difficulty.     Oncologic History   Over several months, patient noted worsening abdominal pain and weight loss. Extensive work-up including EGD, colonoscopy and imaging were unrevealing. After suggestive HIDA scan, she underwent cholecystectomy on September 06, 2016 but symptoms persisted.      MRCP on September 25, 2016 demonstrated2x2cm soft tissue mass-like abnormality in celiac region, contiguous with medial margin of uncinate process of pancreas concerning for pancreatic lession vs lymphadenopathy.     EUS/FNA on September 27, 2016 demonstrated a mass in pancreas uncinate process and lymph node pressing celiac axis. FNA confirmed adenocarcinoma.      She was transferred to [facility] for additional management. Abd/pelvis CT on September 30, 2016 revealed ill-defined prominent retroperitoneal soft tissue with central low attenuation suggestive of necrosis which abuts SMA and is adjacent to pancreatic uncinate process similar to prior MRI. Differential includes pancreatic neoplasm versus lymphadenopathy. CT angio on September 29, 2016 showed no pulmonary embolism. Nonspecific 3 mm nodules in right upper and middle lobe, recommend attention on  follow-up imaging.      Diagnostic laparoscopy and port placement by Dr. Ron Agee on October 04, 2016 - no evidence of metastatic disease.     FOLFIRINOX chemotherapy - treatment start date 10/05/2016.  Completed 8 cycles.     Neoadjuvant SBRT 25 Gy in 5 fractions pancreatic cancer, with concurrent boost to 30 Gy in 5 fractions to tumor around SMA from 12/128/18-03/08/17      Medical History  Past Medical History:   Diagnosis Date    Cancer     Depression     Fibromyalgia     Hypothyroidism      Surgical History  Past Surgical History:   Procedure Laterality Date    CHOLECYSTECTOMY      CHOLECYSTECTOMY, LAPAROSCOPIC  09/06/2016    HYSTERECTOMY  08/1986    Fibroids    KNEE SURGERY Right     PR INSERT TUNNELED CV CATH WITH PORT Right 10/04/2016    Procedure: Right IJ MEDIPORT Insertion;  Surgeon: Delano Metz, MD;  Location: [facility] MAIN OR;  Service: Oncology General    PR LAP,DIAGNOSTIC ABDOMEN N/A 10/04/2016    Procedure: LAPAROSCOPY DIAGNOSTIC;  Surgeon: Delano Metz, MD;  Location: [facility] MAIN OR;  Service: Oncology General    ROTATOR CUFF REPAIR Right     TONSILLECTOMY AND ADENOIDECTOMY       Family History  Family History   Problem Relation Age of Onset    Breast cancer Mother         died 25    Cancer Father         NHL, died 17    Heart Disease Father  Diabetes Sister     Obesity Sister         4 siblings     Social History  Social History     Social History    Marital status: Divorced     Spouse name: N/A    Number of children: N/A    Years of education: N/A     Occupational History    Not on file.     Social History Main Topics    Smoking status: Never Smoker    Smokeless tobacco: Never Used    Alcohol use No    Drug use: No    Sexual activity: Not on file     Social History Narrative    No narrative on file       Medications  Outpatient Encounter Prescriptions as of 03/11/2017   Medication Sig Dispense Refill    amitriptyline (ELAVIL) 25 MG tablet Take 1 tablet (25 mg total) by mouth nightly 30  tablet 5    dexamethasone (DECADRON) 4 MG tablet Take 1 tablet (4 mg total) by mouth 2 times daily (Patient taking differently: Take 4 mg by mouth daily   ) 60 tablet 5    docusate sodium (COLACE) 100 MG capsule Take 2 capsules (200 mg total) by mouth 2 times daily 120 capsule 5    LORazepam (ATIVAN) 0.5 MG tablet Take 1 tablet (0.5 mg total) by mouth 2 times daily as needed for Anxiety   Max daily dose: 1 mg 60 tablet 0    methadone (DOLOPHINE) 5 MG tablet Take 20mg  in the morning, 20mg  in afternoon and 15mg  at night. 195 tablet 0    enoxaparin (LOVENOX) 100 mg/mL injection Inject 1 Syringe (100 mg total) into the skin daily 30 Syringe 5    potassium chloride SA (KLOR-CON M20) 20 mEq  tablet Take 20 mEq by mouth 2 times daily      fluorouracil (ADRUCIL) 50 MG/ML injection Administer 3,477 mg into the vein every 14 days   Start in infusion center.  Infuse over 46 hours at home. 3477 mg 2    pegfilgrastim (NEULASTA) 6 MG/0.6ML injection syringe Inject 0.6 mLs (6 mg total) into the skin once 0.6 mL 3    famotidine (PEPCID) 20 MG tablet Take 1 tablet (20 mg total) by mouth 2 times daily 60 tablet 3    escitalopram (LEXAPRO) 20 MG tablet Take 1 tablet (20 mg total) by mouth daily 30 tablet 3    metoclopramide (REGLAN) 5 MG tablet Take 1 tablet (5 mg total) by mouth 3 times daily (before meals) 90 tablet 5    pantoprazole (PROTONIX) 40 MG EC tablet Take 1 tablet (40 mg total) by mouth 2 times daily (before meals)   Swallow whole. Do not crush, break, or chew. 60 tablet 5    senna (SENOKOT) 8.6 MG tablet Take 2 tablets by mouth 2 times daily 120 tablet 5    Meds Exist in Therapy Plan Peg-Filgrastim      heparin 100 UNIT/ML injection 5 mLs (500 Units total) by Intracatheter route as needed (for line care) 30 Syringe 5    sodium chloride 0.9 % flush 10 mLs by Intracatheter route as needed (for line care) 600 mL 5    PREMARIN 0.625 MG tablet       ondansetron (ZOFRAN-ODT) 4 MG disintegrating tablet Take 1  tablet (4 mg total) by mouth 3 times daily as needed for Nausea   Place on top of tongue. 60 tablet 2  levothyroxine (SYNTHROID, LEVOTHROID) 137 MCG tablet Take 1 tablet (137 mcg total) by mouth daily (before breakfast) 30 tablet 0    Vitamins - senior (CENTRUM SILVER) TABS Take 1 tablet by mouth daily 30 tablet 0    triamterene-hydrochlorothiazide (MAXZIDE-25) 37.5-25 MG per tablet Take 1 tablet by mouth daily 30 tablet 0     No facility-administered encounter medications on file as of 03/11/2017.       Allergies  No Known Allergies (drug, envir, food or latex)  Review of Systems  Review of Systems   Constitutional: Positive for malaise/fatigue. Negative for chills, fever and weight loss.   HENT: Negative for hearing loss.    Eyes: Negative for blurred vision.   Respiratory: Negative for cough and shortness of breath.    Cardiovascular: Negative for chest pain, palpitations and leg swelling.   Gastrointestinal: Positive for nausea. Negative for abdominal pain, blood in stool, constipation, diarrhea, heartburn and vomiting.   Genitourinary: Negative for frequency.   Skin: Negative for rash.   Neurological: Negative for weakness.   Endo/Heme/Allergies: Does not bruise/bleed easily.   Psychiatric/Behavioral: Negative for memory loss.     Physical Exam  BP 132/62    Pulse 92    Temp 36.4 C (97.5 F)    Resp 14    Ht 170.2 cm (5\' 7" )    Wt 73 kg (160 lb 15 oz)    SpO2 100%    BMI 25.21 kg/m   Physical Exam   Constitutional: She is oriented to person, place, and time. She appears well-developed and well-nourished. No distress.   HENT:   Head: Normocephalic.   Mouth/Throat: No oropharyngeal exudate.   Eyes: Pupils are equal, round, and reactive to light. Conjunctivae and EOM are normal. Right eye exhibits no discharge. Left eye exhibits no discharge. No scleral icterus.   Neck: Normal range of motion.   Cardiovascular: Normal rate, regular rhythm, normal heart sounds and intact distal pulses.  Exam reveals no gallop  and no friction rub.    No murmur heard.  Pulmonary/Chest: Effort normal and breath sounds normal. No respiratory distress. She has no wheezes. She has no rales.   Abdominal: Soft. Bowel sounds are normal. She exhibits no distension and no mass. There is no tenderness. There is no rebound and no guarding.   Musculoskeletal: She exhibits no edema.   Lymphadenopathy:     She has no cervical adenopathy.   Neurological: She is alert and oriented to person, place, and time.   Skin: Skin is warm and dry. No rash noted. She is not diaphoretic. No erythema. No pallor.   Psychiatric: She has a normal mood and affect. Her behavior is normal. Judgment and thought content normal.     Laboratory Data      Lab results: 02/01/17  1150   WBC 10.9*       No components found with this basename:  HGB,  HCT,  PLT,  CEA,  CA19-9  Pathology  Incoming Consult Med Cyto       Collected: 10/18/2016 10:35 AM         FINAL DIAGNOSIS:   Outside material received from [facility], [place] for consultation.     Cytology Consultation:   Pancreas, head, mass, fine needle aspiration ([id number], 09/27/2016):     Atypical cells suspicious for adenocarcinoma. See comment.     Comment: Limited cells of interest are present for evaluation. Clinical correlation is recommended.     Imaging Data  Ct Abdomen And Pelvis With Contrast  Result Date: 01/18/2017  Stable treated Pancreatic carcinoma (uncinate process). Heterogeneous uncinate process without discrete measurable tumor. Resolution of original regional lymphadenopathy. Small amount of residual soft tissue extending to the SMA. No new metastases. Altogether stable compared to September 2018 and is improved (partial response) compared to July 2018.     Ct Chest With Contrast  Result Date: 01/18/2017  1.  Filling defect identified in a segmental/subsegmental right lower lobe pulmonary artery consistent with an embolus.   2.  Small scattered pulmonary nodules noted, measuring 2  mm in the right middle lobe and in the left lower lobe measuring 2 mm, not significantly changed when compared with prior imaging. Continued attention on follow-up imaging recommended.   3.  No clearly enlarged lymph nodes by CT size criteria. Findings discussed with Gena Fray via phone at 3:05 pm on 01/18/2017. Please see the separately dictated CT abdomen and pelvis imaging report for detailed findings below the level the diaphragm.       Assessment  Chandler Stofer is a 72 y.o. female with locally advanced pancreatic adenocarcinoma. She completed 8 cycles of FOLFIRINOX on 01/11/17 with restaging scans showing stable disease / partial response. She has also complete SBRT and tolerated well, here to discuss surgical planning.     We dicussed she will undergo pancreatoduodenectomy (whipple surgery). We  Discussed the risks, benefits, and alternatives for surgery including bleeding, infection, blood clots, injury to adjacent structures, pancreatic leak (15%), delayed gastric emptying, pancreatic exocrine insufficiency, worsening glucose tolerance, need for additional treatment/procedures, recurrence and the risks of general anesthetic including MI, CVA, sudden death or even reaction to anesthetic medications. The patient understands the risks, any and all questions were answered to the patient's satisfaction.    Plan  1. Scheduled for surgery on 03/29/17.   2.Consent reviewed, preoperative teaching completed, patient verbalizes understanding of instructions, he does not object to blood transfusion, preoperative orders completed, labs to be repeated: T&S, prealbumin, PT/INR, CMP, CBC, patient to discontinue ASA/NSAIDS 7 days prior to surgery, Hold lovenox day prior to surgery.   3. Whipple ERAS protocol reviewed.    4. Will bring completed HCP day of surgery  5. Impact nutritional supplement prescribed    Rodrigo Ran, NP      I saw, examined and evaluated the patient, reviewed the patient chart and the relevant images  and lab findings. Rodrigo Ran, NP has acted as a Education administrator for this encounter at this office visit.      72 year old female with borderline resectable pancreatic cancer who underwent neoadjuvant chemotherapy. She tolerated chemotherapy well and has a visible response in her tumor with no evidence of recurrence. Her tumor marker was initially well over 800 and has normalized at 37. She has had continued abdominal pain which has been treated with methadone which she still requires but has given good control.      I reviewed her imaging and discussed the recommendations to proceed with resection. We reviewed pancreaticoduodenectomy. The procedure, post operative course and possible complications. Hospital stay should be 5-7 days barring any major complications. Enrollment in an enhanced recovery after surgery program was also discussed with goals to improve outcome and recovery.     We have planned for surgery in two weeks to encourage further recovery from chemotherapy and made exercise and nutritional recommendations for this time. We will keep you posted.       I formulated the assessment plan and directed the care  of this patient. My detailed recommendations and opinion are documented in the above edited note which reflect the management plans. The patient is agreeable with this plan.    Delano Metz, MD, FACS  Associate Professor of Surgery  Division of Hepatobiliary, Pancreas, & GI Surgery

## 2017-03-22 ENCOUNTER — Ambulatory Visit: Payer: Medicare (Managed Care)

## 2017-03-22 DIAGNOSIS — C259 Malignant neoplasm of pancreas, unspecified: Secondary | ICD-10-CM

## 2017-03-22 LAB — CBC AND DIFFERENTIAL
Baso # K/uL: 0.1 10*3/uL (ref 0.0–0.1)
Basophil %: 0.6 %
Eos # K/uL: 0 10*3/uL (ref 0.0–0.4)
Eosinophil %: 0.3 %
Hematocrit: 42 % (ref 34–45)
Hemoglobin: 13.2 g/dL (ref 11.2–15.7)
IMM Granulocytes #: 0.1 10*3/uL (ref 0.0–0.1)
IMM Granulocytes: 0.5 %
Lymph # K/uL: 0.9 10*3/uL — ABNORMAL LOW (ref 1.2–3.7)
Lymphocyte %: 8.5 %
MCH: 32 pg/cell (ref 26–32)
MCHC: 31 g/dL — ABNORMAL LOW (ref 32–36)
MCV: 101 fL — ABNORMAL HIGH (ref 79–95)
Mono # K/uL: 0.4 10*3/uL (ref 0.2–0.9)
Monocyte %: 3.2 %
Neut # K/uL: 9.6 10*3/uL — ABNORMAL HIGH (ref 1.6–6.1)
Nucl RBC # K/uL: 0 10*3/uL (ref 0.0–0.0)
Nucl RBC %: 0 /100 WBC (ref 0.0–0.2)
Platelets: 393 10*3/uL — ABNORMAL HIGH (ref 160–370)
RBC: 4.2 MIL/uL (ref 3.9–5.2)
RDW: 15.4 % — ABNORMAL HIGH (ref 11.7–14.4)
Seg Neut %: 86.9 %
WBC: 11 10*3/uL — ABNORMAL HIGH (ref 4.0–10.0)

## 2017-03-22 LAB — COMPREHENSIVE METABOLIC PANEL
ALT: 32 U/L (ref 0–35)
AST: 36 U/L — ABNORMAL HIGH (ref 0–35)
Albumin: 4.4 g/dL (ref 3.5–5.2)
Alk Phos: 116 U/L — ABNORMAL HIGH (ref 35–105)
Anion Gap: 17 — ABNORMAL HIGH (ref 7–16)
Bilirubin,Total: <0.2 mg/dL (ref 0.0–1.2)
CO2: 27 mmol/L (ref 20–28)
Calcium: 9.7 mg/dL (ref 8.6–10.2)
Chloride: 95 mmol/L — ABNORMAL LOW (ref 96–108)
Creatinine: 1.08 mg/dL — ABNORMAL HIGH (ref 0.51–0.95)
GFR,Black: 60 *
GFR,Caucasian: 52 * — AB
Glucose: 136 mg/dL — ABNORMAL HIGH (ref 60–99)
Lab: 27 mg/dL — ABNORMAL HIGH (ref 6–20)
Potassium: 4.4 mmol/L (ref 3.3–5.1)
Sodium: 139 mmol/L (ref 133–145)
Total Protein: 7.4 g/dL (ref 6.3–7.7)

## 2017-03-22 LAB — NEUTROPHIL #-INSTRUMENT: Neutrophil #-Instrument: 9.6 10*3/uL

## 2017-03-22 LAB — CA 19 9 (EFF. 01-2011): CA 19 9 (eff. 01-2011): 38 U/mL — ABNORMAL HIGH (ref 0–35)

## 2017-03-22 NOTE — Progress Notes (Signed)
IVAD accessed with good blood return. Labs obtained per orders. Flushed with normal saline and heparin and deaccessed. Tolerated well.

## 2017-03-27 NOTE — Anesthesia Preprocedure Evaluation (Addendum)
Anesthesia Pre-operative History and Physical for Amanda Galloway    Highlighted Issues for this Procedure:  72 y.o. female with Malignant neoplasm of pancreas, unspecified location of malignancy (C25.9) presenting for Procedure(s):  WHIPPLE PROCEDURE by Surgeon(s):  Amanda Metz, MD scheduled for 390 minutes.       CPM Summary:    I have reviewed the RN anesthesia screening information and erecords for CPM. The patient is scheduled for WHIPPLE PROCEDURE - POSSIBLE ELECTROPARESIS on 03/29/17 with Dr. Ron Agee under [place].    Past medical history is significant for:  Malignant neoplasm of pancreas - adrucil, neulasta - mediport insertion 10/04/16 - FOLFIRINOX chemotherapy - treatment start date 10/05/2016.  Completed 8 cycles 01/11/17 - completed radiation 02/13/17 - 03/08/17  Abdominal pain - methadone prescribed by Oncology, dilaudid prn - last note 03/08/17 in EMR  PE 01/18/17 on CT chest with IV contrast - lovenox  HTN - KCL, maxzide  GERD - pepcid, protonix  Hypothyroid - levothyroxine  Fibromyalgia  Depression - lexapro, elavil  Anxiety - lorazepam  BMI 25.3    According to the anesthesia RN screening report the patient has no present complaints of chest pain. Able to climb FOS but very SOB, needs to rest for several minutes to recover, feels that even doing her hair is an effort or walking around home - used to workout 3 times per week prior to diagnosis of cancer 09/2016. Able to lie flat.     Anesthesia history:   10/04/16 LAPAROSCOPY DIAGNOSTIC, Right IJ MEDIPORT Insertion - GA - ETT 7.0 - video laryngoscopy - MAC 3 - grade IIa partial view of glottis - unsuccessful attempts with oral ETT and DL - Mallampati II     No additional cardiopulmonary testing or in person anesthesia evaluation necessary for the scheduled procedure.  CPM Chart Review [name], NP at 11:42 AM on 03/27/2017    Anesthesia Evaluation Information Source: records     ANESTHESIA HISTORY  Pertinent(-):  No History of anesthetic complications or Family  hx of anesthetic complications    GENERAL  Pertinent (-):  No obesity     PULMONARY  Pertinent(-):  No smoking, asthma or COPD    CARDIOVASCULAR  Good(4+METs) Exercise Tolerance    + Hypertension    GI/HEPATIC/RENAL  Last PO Intake: Enter Last PO Intake in ROS/Med Hx Tab    + GERD NEURO/PSYCH    + Chronic pain (abdominal pain)          fibromyalgia, on methadone    + Psychiatric Issues          anxiety, depression    ENDO/OTHER    + Thyroid Disease        HYPOthyroid     HEMATOLOGIC    + Anticoagulants/Antiplatelets (lovenox)       Physical Exam    Airway            Mouth opening: normal            Mallampati: I            TM distance (fb): >3 FB  Dental   Normal Exam   Cardiovascular           Rhythm: regular           Rate: normal  No weak pulses      Neurologic    Normal Exam    General Survey    Normal Exam   Pulmonary   Normal Exam    Mental Status  Normal Exam         ________________________________________________________________________  PLAN  ASA Score  3  Anesthetic Plan general    Protocols/Care Pathways (Whipple) Induction (routine IV) General Anesthesia/Sedation Maintenance Plan (inhaled agents, IV bolus, neuromuscular blockade and dexmedetomidine infusion);  Airway Manipulation (direct laryngoscopy); Airway (cuffed ETT); Line ( use current access, additional large bore IV and A- line); Monitoring (standard ASA, postinduction arterial and BIS); Positioning (supine); PONV Plan (dexamethasone and ondansetron); Pain (per surgical team and neuraxial morphine); PostOp (PACU or possible ICU)    Informed Consent     Attending Attestation:  As the primary attending anesthesiologist, I attest that the patient or proxy understands and accepts the risks and benefits of the anesthesia plan. I also attest that I have personally performed a pre-anesthetic examination and evaluation, and prescribed the anesthetic plan for this particular location within 48 hours prior to the anesthetic as documented. Ardath Sax,  MD 7:03 AM

## 2017-03-28 ENCOUNTER — Encounter: Payer: Self-pay | Admitting: Anesthesiology

## 2017-03-29 ENCOUNTER — Encounter
Admission: RE | Disposition: A | Discharge status: Home Health | Payer: Self-pay | Source: Ambulatory Visit | Attending: Surgical Oncology

## 2017-03-29 ENCOUNTER — Inpatient Hospital Stay: Payer: Medicare (Managed Care) | Admitting: Nurse Practitioner

## 2017-03-29 ENCOUNTER — Inpatient Hospital Stay
Admission: RE | Admit: 2017-03-29 | Discharge: 2017-08-24 | DRG: 405 | Disposition: A | Discharge status: Home Health | Payer: Medicare (Managed Care) | Source: Ambulatory Visit | Attending: Surgical Oncology | Admitting: Surgical Oncology

## 2017-03-29 DIAGNOSIS — Z923 Personal history of irradiation: Secondary | ICD-10-CM

## 2017-03-29 DIAGNOSIS — B3781 Candidal esophagitis: Secondary | ICD-10-CM | POA: Diagnosis not present

## 2017-03-29 DIAGNOSIS — F3289 Other specified depressive episodes: Secondary | ICD-10-CM | POA: Diagnosis present

## 2017-03-29 DIAGNOSIS — I2699 Other pulmonary embolism without acute cor pulmonale: Secondary | ICD-10-CM | POA: Diagnosis present

## 2017-03-29 DIAGNOSIS — J690 Pneumonitis due to inhalation of food and vomit: Secondary | ICD-10-CM | POA: Diagnosis not present

## 2017-03-29 DIAGNOSIS — A4151 Sepsis due to Escherichia coli [E. coli]: Secondary | ICD-10-CM | POA: Diagnosis not present

## 2017-03-29 DIAGNOSIS — C250 Malignant neoplasm of head of pancreas: Secondary | ICD-10-CM

## 2017-03-29 DIAGNOSIS — G893 Neoplasm related pain (acute) (chronic): Secondary | ICD-10-CM | POA: Diagnosis present

## 2017-03-29 DIAGNOSIS — Z6824 Body mass index (BMI) 24.0-24.9, adult: Secondary | ICD-10-CM

## 2017-03-29 DIAGNOSIS — J984 Other disorders of lung: Secondary | ICD-10-CM | POA: Diagnosis not present

## 2017-03-29 DIAGNOSIS — G8918 Other acute postprocedural pain: Secondary | ICD-10-CM

## 2017-03-29 DIAGNOSIS — K311 Adult hypertrophic pyloric stenosis: Secondary | ICD-10-CM | POA: Diagnosis not present

## 2017-03-29 DIAGNOSIS — G4700 Insomnia, unspecified: Secondary | ICD-10-CM | POA: Diagnosis not present

## 2017-03-29 DIAGNOSIS — K567 Ileus, unspecified: Secondary | ICD-10-CM | POA: Diagnosis not present

## 2017-03-29 DIAGNOSIS — E8809 Other disorders of plasma-protein metabolism, not elsewhere classified: Secondary | ICD-10-CM | POA: Diagnosis not present

## 2017-03-29 DIAGNOSIS — A4181 Sepsis due to Enterococcus: Secondary | ICD-10-CM | POA: Diagnosis not present

## 2017-03-29 DIAGNOSIS — R6521 Severe sepsis with septic shock: Secondary | ICD-10-CM | POA: Diagnosis not present

## 2017-03-29 DIAGNOSIS — R739 Hyperglycemia, unspecified: Secondary | ICD-10-CM | POA: Diagnosis not present

## 2017-03-29 DIAGNOSIS — K5900 Constipation, unspecified: Secondary | ICD-10-CM | POA: Diagnosis present

## 2017-03-29 DIAGNOSIS — K651 Peritoneal abscess: Secondary | ICD-10-CM | POA: Diagnosis not present

## 2017-03-29 DIAGNOSIS — G9340 Encephalopathy, unspecified: Secondary | ICD-10-CM | POA: Diagnosis present

## 2017-03-29 DIAGNOSIS — N179 Acute kidney failure, unspecified: Secondary | ICD-10-CM | POA: Diagnosis not present

## 2017-03-29 DIAGNOSIS — D62 Acute posthemorrhagic anemia: Secondary | ICD-10-CM | POA: Diagnosis not present

## 2017-03-29 DIAGNOSIS — E871 Hypo-osmolality and hyponatremia: Secondary | ICD-10-CM | POA: Diagnosis not present

## 2017-03-29 DIAGNOSIS — G928 Other toxic encephalopathy: Secondary | ICD-10-CM | POA: Diagnosis not present

## 2017-03-29 DIAGNOSIS — Z9221 Personal history of antineoplastic chemotherapy: Secondary | ICD-10-CM

## 2017-03-29 DIAGNOSIS — C259 Malignant neoplasm of pancreas, unspecified: Secondary | ICD-10-CM

## 2017-03-29 DIAGNOSIS — F329 Major depressive disorder, single episode, unspecified: Secondary | ICD-10-CM | POA: Diagnosis present

## 2017-03-29 DIAGNOSIS — F32A Depression, unspecified: Secondary | ICD-10-CM | POA: Diagnosis present

## 2017-03-29 DIAGNOSIS — R579 Shock, unspecified: Secondary | ICD-10-CM

## 2017-03-29 DIAGNOSIS — J90 Pleural effusion, not elsewhere classified: Secondary | ICD-10-CM | POA: Diagnosis not present

## 2017-03-29 DIAGNOSIS — K30 Functional dyspepsia: Secondary | ICD-10-CM | POA: Diagnosis not present

## 2017-03-29 DIAGNOSIS — E039 Hypothyroidism, unspecified: Secondary | ICD-10-CM

## 2017-03-29 DIAGNOSIS — A419 Sepsis, unspecified organism: Secondary | ICD-10-CM | POA: Diagnosis not present

## 2017-03-29 DIAGNOSIS — E440 Moderate protein-calorie malnutrition: Secondary | ICD-10-CM | POA: Diagnosis present

## 2017-03-29 DIAGNOSIS — D649 Anemia, unspecified: Secondary | ICD-10-CM

## 2017-03-29 DIAGNOSIS — M797 Fibromyalgia: Secondary | ICD-10-CM | POA: Diagnosis present

## 2017-03-29 DIAGNOSIS — K750 Abscess of liver: Secondary | ICD-10-CM | POA: Diagnosis not present

## 2017-03-29 HISTORY — PX: PR PNCRTECT PROX STOT W/PANCREATOJEJUNOSTOMY: 48150

## 2017-03-29 HISTORY — PX: PR PART REMV PANC,PROX+REMV DUOD+ANAST: 48150

## 2017-03-29 LAB — BLOOD GASES AND WHOLE BLD ANALYTES ART
Anion Gap,WB: 13 (ref 7–16)
Anion Gap,WB: 16 (ref 7–16)
Anion Gap,WB: 12 (ref 7–16)
Anion Gap,WB: 15 (ref 7–16)
Anion Gap,WB: 18 — ABNORMAL HIGH (ref 7–16)
Base Excess, Arterial: 1 mmol/L (ref <=2)
Base Excess, Arterial: -2 mmol/L (ref <=2)
Base Excess, Arterial: -3 mmol/L — ABNORMAL LOW (ref <=2)
Base Excess, Arterial: -4 mmol/L — ABNORMAL LOW (ref <=2)
Base Excess, Arterial: -5 mmol/L — ABNORMAL LOW (ref <=2)
CO2,ART (Calc): 25 mmol/L (ref 21–28)
CO2,ART (Calc): 24 mmol/L (ref 21–28)
CO2,ART (Calc): 23 mmol/L (ref 21–28)
CO2,ART (Calc): 22 mmol/L (ref 21–28)
CO2,ART (Calc): 21 mmol/L (ref 21–28)
CO: 0.7 %
CO: 0.6 %
CO: 0.9 %
CO: 0.6 %
CO: 0.7 %
Chloride,WB: 98 mmol/L (ref 98–108)
Chloride,WB: 97 mmol/L — ABNORMAL LOW (ref 98–108)
Chloride,WB: 99 mmol/L (ref 98–108)
Chloride,WB: 99 mmol/L (ref 98–108)
Chloride,WB: 99 mmol/L (ref 98–108)
FO2 Hb, Arterial: 97 % — ABNORMAL HIGH (ref 90–95)
FO2 Hb, Arterial: 98 % — ABNORMAL HIGH (ref 90–95)
FO2 Hb, Arterial: 98 % — ABNORMAL HIGH (ref 90–95)
FO2 Hb, Arterial: 98 % — ABNORMAL HIGH (ref 90–95)
FO2 Hb, Arterial: 98 % — ABNORMAL HIGH (ref 90–95)
Glucose,WB: 93 mg/dL (ref 60–99)
Glucose,WB: 176 mg/dL — ABNORMAL HIGH (ref 60–99)
Glucose,WB: 163 mg/dL — ABNORMAL HIGH (ref 60–99)
Glucose,WB: 196 mg/dL — ABNORMAL HIGH (ref 60–99)
Glucose,WB: 188 mg/dL — ABNORMAL HIGH (ref 60–99)
HCO3, Arterial: 24 mmol/L — ABNORMAL HIGH (ref 19–23)
HCO3, Arterial: 23 mmol/L (ref 19–23)
HCO3, Arterial: 22 mmol/L (ref 19–23)
HCO3, Arterial: 21 mmol/L (ref 19–23)
HCO3, Arterial: 20 mmol/L (ref 19–23)
HCT (Calc): 31 % — ABNORMAL LOW (ref 34–45)
HCT (Calc): 34 % (ref 34–45)
HCT (Calc): 28 % — ABNORMAL LOW (ref 34–45)
HCT (Calc): 30 % — ABNORMAL LOW (ref 34–45)
HCT (Calc): 29 % — ABNORMAL LOW (ref 34–45)
Hemoglobin: 10.6 g/dL — ABNORMAL LOW (ref 11.2–15.7)
Hemoglobin: 11.5 g/dL (ref 11.2–15.7)
Hemoglobin: 9.4 g/dL — ABNORMAL LOW (ref 11.2–15.7)
Hemoglobin: 10.3 g/dL — ABNORMAL LOW (ref 11.2–15.7)
Hemoglobin: 9.9 g/dL — ABNORMAL LOW (ref 11.2–15.7)
ICA @7.4,WB: 4.1 mg/dL — ABNORMAL LOW (ref 4.8–5.2)
ICA @7.4,WB: 4.5 mg/dL — ABNORMAL LOW (ref 4.8–5.2)
ICA @7.4,WB: 4.1 mg/dL — ABNORMAL LOW (ref 4.8–5.2)
ICA @7.4,WB: 4 mg/dL — ABNORMAL LOW (ref 4.8–5.2)
ICA @7.4,WB: 4.3 mg/dL — ABNORMAL LOW (ref 4.8–5.2)
ICA Uncorr,WB: 3.9 mg/dL
ICA Uncorr,WB: 4.5 mg/dL
ICA Uncorr,WB: 4.1 mg/dL
ICA Uncorr,WB: 4 mg/dL
ICA Uncorr,WB: 4.3 mg/dL
Lactate ART,WB: 1.2 mmol/L — ABNORMAL HIGH (ref 0.3–0.8)
Lactate ART,WB: 1.5 mmol/L — ABNORMAL HIGH (ref 0.3–0.8)
Lactate ART,WB: 4.2 mmol/L (ref 0.3–0.8)
Lactate ART,WB: 7.5 mmol/L (ref 0.3–0.8)
Lactate ART,WB: 6.8 mmol/L (ref 0.3–0.8)
Methemoglobin: 0.3 % (ref 0.0–1.0)
Methemoglobin: 0.3 % (ref 0.0–1.0)
Methemoglobin: 0.3 % (ref 0.0–1.0)
Methemoglobin: 0.3 % (ref 0.0–1.0)
Methemoglobin: 0.3 % (ref 0.0–1.0)
NA, WB: 133 mmol/L — ABNORMAL LOW (ref 135–145)
NA, WB: 133 mmol/L — ABNORMAL LOW (ref 135–145)
NA, WB: 130 mmol/L — ABNORMAL LOW (ref 135–145)
NA, WB: 131 mmol/L — ABNORMAL LOW (ref 135–145)
NA, WB: 133 mmol/L — ABNORMAL LOW (ref 135–145)
Potassium,WB: 2.8 mmol/L — CL (ref 3.4–4.7)
Potassium,WB: 3 mmol/L — ABNORMAL LOW (ref 3.4–4.7)
Potassium,WB: 3.1 mmol/L — ABNORMAL LOW (ref 3.4–4.7)
Potassium,WB: 3.6 mmol/L (ref 3.4–4.7)
Potassium,WB: 3.5 mmol/L (ref 3.4–4.7)
pCO2, Arterial: 34 mm Hg (ref 33–43)
pCO2, Arterial: 38 mm Hg (ref 33–43)
pCO2, Arterial: 39 mm Hg (ref 33–43)
pCO2, Arterial: 36 mm Hg (ref 33–43)
pCO2, Arterial: 34 mm Hg (ref 33–43)
pH: 7.47 — ABNORMAL HIGH (ref 7.36–7.44)
pH: 7.4 (ref 7.36–7.44)
pH: 7.37 (ref 7.36–7.44)
pH: 7.38 (ref 7.36–7.44)
pH: 7.37 (ref 7.36–7.44)
pO2,Arterial: 148 mm Hg — ABNORMAL HIGH (ref 80–100)
pO2,Arterial: 207 mm Hg — ABNORMAL HIGH (ref 80–100)
pO2,Arterial: 192 mm Hg — ABNORMAL HIGH (ref 80–100)
pO2,Arterial: 232 mm Hg — ABNORMAL HIGH (ref 80–100)
pO2,Arterial: 218 mm Hg — ABNORMAL HIGH (ref 80–100)

## 2017-03-29 LAB — PROTIME-INR
INR: 1 (ref 0.9–1.1)
Protime: 10.7 s (ref 10.0–12.9)

## 2017-03-29 LAB — RED BLOOD CELLS
Coded Blood type: 600
Coded Blood type: 600
Component blood type: A NEG
Component blood type: A NEG

## 2017-03-29 LAB — CBC
Hematocrit: 32 % — ABNORMAL LOW (ref 34–45)
Hemoglobin: 10.4 g/dL — ABNORMAL LOW (ref 11.2–15.7)
MCH: 32 pg/cell (ref 26–32)
MCHC: 32 g/dL (ref 32–36)
MCV: 99 fL — ABNORMAL HIGH (ref 79–95)
Platelets: 352 10*3/uL (ref 160–370)
RBC: 3.3 MIL/uL — ABNORMAL LOW (ref 3.9–5.2)
RDW: 15.8 % — ABNORMAL HIGH (ref 11.7–14.4)
WBC: 15.3 10*3/uL — ABNORMAL HIGH (ref 4.0–10.0)

## 2017-03-29 LAB — COMPREHENSIVE METABOLIC PANEL
ALT: 364 U/L — ABNORMAL HIGH (ref 0–35)
AST: 434 U/L — ABNORMAL HIGH (ref 0–35)
Albumin: 2.7 g/dL — ABNORMAL LOW (ref 3.5–5.2)
Alk Phos: 101 U/L (ref 35–105)
Anion Gap: 19 — ABNORMAL HIGH (ref 7–16)
Bilirubin,Total: 0.4 mg/dL (ref 0.0–1.2)
CO2: 21 mmol/L (ref 20–28)
Calcium: 7.8 mg/dL — ABNORMAL LOW (ref 8.6–10.2)
Chloride: 94 mmol/L — ABNORMAL LOW (ref 96–108)
Creatinine: 0.73 mg/dL (ref 0.51–0.95)
GFR,Black: 96 *
GFR,Caucasian: 83 *
Glucose: 209 mg/dL — ABNORMAL HIGH (ref 60–99)
Lab: 14 mg/dL (ref 6–20)
Potassium: 4.6 mmol/L (ref 3.3–5.1)
Sodium: 134 mmol/L (ref 133–145)
Total Protein: 4.7 g/dL — ABNORMAL LOW (ref 6.3–7.7)

## 2017-03-29 LAB — POCT GLUCOSE
Glucose POCT: 95 mg/dL (ref 60–99)
Glucose POCT: 194 mg/dL — ABNORMAL HIGH (ref 60–99)

## 2017-03-29 LAB — MAGNESIUM: Magnesium: 2.5 mg/dL (ref 1.6–2.5)

## 2017-03-29 LAB — GRAM STAIN: Gram Stain: 0

## 2017-03-29 LAB — TYPE AND SCREEN
ABO RH Blood Type: A NEG
Antibody Screen: NEGATIVE

## 2017-03-29 LAB — PHOSPHORUS: Phosphorus: 4.9 mg/dL — ABNORMAL HIGH (ref 2.7–4.5)

## 2017-03-29 SURGERY — WHIPPLE PROCEDURE
Anesthesia: General | Site: Abdomen | Wound class: Clean

## 2017-03-29 MED ORDER — HEPARIN SODIUM (PORCINE) 5000 UNIT/ML IJ SOLN *I*
INTRAMUSCULAR | Status: DC | PRN
Start: 2017-03-29 — End: 2017-03-29
  Administered 2017-03-29: 5000 [IU] via INTRAVENOUS

## 2017-03-29 MED ORDER — HEPARIN SODIUM 5000 UNIT/ML SQ *I*
SUBCUTANEOUS | Status: AC
Start: 2017-03-29 — End: 2017-03-29
  Filled 2017-03-29: qty 1

## 2017-03-29 MED ORDER — HEPARIN SODIUM 5000 UNIT/ML SQ *I*
5000.0000 [IU] | Freq: Three times a day (TID) | SUBCUTANEOUS | Status: DC
Start: 2017-03-29 — End: 2017-03-30
  Administered 2017-03-29 – 2017-03-30 (×2): 5000 [IU] via SUBCUTANEOUS
  Filled 2017-03-29 (×2): qty 1

## 2017-03-29 MED ORDER — LACTATED RINGERS IV SOLN *I*
84.0000 mL/h | INTRAVENOUS | Status: DC
Start: 2017-03-29 — End: 2017-03-31
  Administered 2017-03-29 (×2): 84 mL/h via INTRAVENOUS

## 2017-03-29 MED ORDER — SUGAMMADEX SODIUM 100 MG/1ML IV SOLN *WRAPPED*
INTRAVENOUS | Status: AC
Start: 2017-03-29 — End: 2017-03-29
  Filled 2017-03-29: qty 2

## 2017-03-29 MED ORDER — ACETAMINOPHEN 10 MG/ML IV SOLN *I*
INTRAVENOUS | Status: DC | PRN
Start: 2017-03-29 — End: 2017-03-29
  Administered 2017-03-29: 1000 mg via INTRAVENOUS

## 2017-03-29 MED ORDER — KETAMINE HCL 10 MG/ML IJ/IV SOLN *WRAPPED*
Status: AC
Start: 2017-03-29 — End: 2017-03-29
  Filled 2017-03-29: qty 20

## 2017-03-29 MED ORDER — ACETAMINOPHEN 10 MG/ML IV SOLN *I*
1000.0000 mg | Freq: Three times a day (TID) | INTRAVENOUS | Status: DC
Start: 2017-03-29 — End: 2017-03-30
  Administered 2017-03-29 – 2017-03-30 (×3): 1000 mg via INTRAVENOUS
  Filled 2017-03-29 (×3): qty 100

## 2017-03-29 MED ORDER — DEXMEDETOMIDINE HCL 200 MCG/2ML IV SOLN WRAPPED *I*
INTRAVENOUS | Status: DC | PRN
Start: 2017-03-29 — End: 2017-03-29
  Administered 2017-03-29: 31 ug via INTRAVENOUS

## 2017-03-29 MED ORDER — EPHEDRINE 5MG/ML IN NS IV/IJ *WRAPPED*
INTRAMUSCULAR | Status: DC | PRN
Start: 2017-03-29 — End: 2017-03-29
  Administered 2017-03-29: 5 mg via INTRAVENOUS
  Administered 2017-03-29: 10 mg via INTRAVENOUS
  Administered 2017-03-29 (×3): 5 mg via INTRAVENOUS
  Administered 2017-03-29: 10 mg via INTRAVENOUS
  Administered 2017-03-29 (×3): 5 mg via INTRAVENOUS

## 2017-03-29 MED ORDER — SUGAMMADEX SODIUM 100 MG/1ML IV SOLN *WRAPPED*
INTRAVENOUS | Status: DC | PRN
Start: 2017-03-29 — End: 2017-03-29
  Administered 2017-03-29: 200 mg via INTRAVENOUS

## 2017-03-29 MED ORDER — LACTATED RINGERS IV SOLN *I*
84.0000 mL/h | INTRAVENOUS | Status: DC
Start: 2017-03-29 — End: 2017-04-01
  Administered 2017-03-29 – 2017-04-01 (×54): 84 mL/h via INTRAVENOUS

## 2017-03-29 MED ORDER — AMITRIPTYLINE HCL 25 MG PO TABS *I*
25.0000 mg | ORAL_TABLET | Freq: Every evening | ORAL | Status: DC
Start: 2017-03-29 — End: 2017-05-15
  Administered 2017-03-29 – 2017-05-14 (×44): 25 mg via ORAL
  Filled 2017-03-29 (×51): qty 1

## 2017-03-29 MED ORDER — HYDROMORPHONE HCL 2 MG/ML IJ SOLN *WRAPPED*
INTRAMUSCULAR | Status: AC
Start: 2017-03-29 — End: 2017-03-29
  Filled 2017-03-29: qty 1

## 2017-03-29 MED ORDER — LACTATED RINGERS IV SOLN *I*
100.0000 mL/h | INTRAVENOUS | Status: DC
Start: 2017-03-29 — End: 2017-03-29

## 2017-03-29 MED ORDER — FENTANYL CITRATE 50 MCG/ML IJ SOLN *WRAPPED*
INTRAMUSCULAR | Status: AC
Start: 2017-03-29 — End: 2017-03-29
  Filled 2017-03-29: qty 2

## 2017-03-29 MED ORDER — HYDROMORPHONE HCL 2 MG/ML IJ SOLN *WRAPPED*
0.5000 mg | INTRAMUSCULAR | Status: AC | PRN
Start: 2017-03-29 — End: 2017-03-29
  Administered 2017-03-29 (×3): 0.5 mg via INTRAVENOUS

## 2017-03-29 MED ORDER — ERTAPENEM SODIUM 1 GM INJ SOLR (100 MG/ML) *WRAPPED*
Status: AC
Start: 2017-03-29 — End: 2017-03-29
  Filled 2017-03-29: qty 10

## 2017-03-29 MED ORDER — PROMETHAZINE HCL 25 MG/ML IJ SOLN *I*
6.2500 mg | Freq: Once | INTRAMUSCULAR | Status: DC | PRN
Start: 2017-03-29 — End: 2017-03-29

## 2017-03-29 MED ORDER — INSULIN LISPRO (HUMAN) 100 UNIT/ML IJ/SC SOLN *WRAPPED*
0.0000 [IU] | SUBCUTANEOUS | Status: DC
Start: 2017-03-29 — End: 2017-04-01
  Administered 2017-03-29: 1 [IU] via SUBCUTANEOUS

## 2017-03-29 MED ORDER — ESCITALOPRAM OXALATE 20 MG PO TABS *I*
20.0000 mg | ORAL_TABLET | Freq: Every evening | ORAL | Status: DC
Start: 2017-03-29 — End: 2017-05-06
  Administered 2017-03-29 – 2017-05-05 (×34): 20 mg via ORAL
  Filled 2017-03-29 (×42): qty 1

## 2017-03-29 MED ORDER — CALCIUM CHLORIDE 10 % IV SOLN *I*
INTRAVENOUS | Status: DC | PRN
Start: 2017-03-29 — End: 2017-03-29
  Administered 2017-03-29: 0.5 g via INTRAVENOUS
  Administered 2017-03-29: 1 g via INTRAVENOUS
  Administered 2017-03-29: 0.5 g via INTRAVENOUS

## 2017-03-29 MED ORDER — MIDAZOLAM HCL 1 MG/ML IJ SOLN *I* WRAPPED
INTRAMUSCULAR | Status: DC | PRN
Start: 2017-03-29 — End: 2017-03-29
  Administered 2017-03-29: 2 mg via INTRAVENOUS

## 2017-03-29 MED ORDER — PROMETHAZINE HCL 25 MG/ML IJ SOLN *I*
12.5000 mg | Freq: Four times a day (QID) | INTRAMUSCULAR | Status: AC | PRN
Start: 2017-03-29 — End: 2017-03-31

## 2017-03-29 MED ORDER — MIDAZOLAM HCL 1 MG/ML IJ SOLN *I* WRAPPED
INTRAMUSCULAR | Status: AC
Start: 2017-03-29 — End: 2017-03-29
  Filled 2017-03-29: qty 2

## 2017-03-29 MED ORDER — SODIUM CHLORIDE 0.9 % IV SOLN WRAPPED *I*
INTRAMUSCULAR | Status: DC | PRN
Start: 2017-03-29 — End: 2017-03-29
  Administered 2017-03-29: .4 mg/kg/h via INTRAVENOUS
  Administered 2017-03-29: 0.5 mg/kg/h via INTRAVENOUS
  Administered 2017-03-29: .3 mg/kg/h via INTRAVENOUS

## 2017-03-29 MED ORDER — DEXAMETHASONE SODIUM PHOSPHATE 4 MG/ML INJ SOLN *WRAPPED*
INTRAMUSCULAR | Status: DC | PRN
Start: 2017-03-29 — End: 2017-03-29
  Administered 2017-03-29: 10 mg via INTRAVENOUS

## 2017-03-29 MED ORDER — NALOXONE HCL 0.4 MG/ML IJ SOLN *WRAPPED*
0.1000 mg | Status: AC | PRN
Start: 2017-03-29 — End: 2017-04-27

## 2017-03-29 MED ORDER — SODIUM CHLORIDE 0.9 % IV SOLN WRAPPED *I*
20.0000 mL/h | Status: DC
Start: 2017-03-29 — End: 2017-03-29

## 2017-03-29 MED ORDER — KETAMINE HCL 10 MG/ML IJ/IV SOLN *WRAPPED*
Status: DC | PRN
Start: 2017-03-29 — End: 2017-03-29
  Administered 2017-03-29: 31 mg via INTRAVENOUS

## 2017-03-29 MED ORDER — SODIUM CHLORIDE 0.9 % IV SOLN WRAPPED *I*
INTRAVENOUS | Status: DC | PRN
Start: 2017-03-29 — End: 2017-03-29
  Administered 2017-03-29: .3 ug/kg/h via INTRAVENOUS
  Administered 2017-03-29: 0.4 ug/kg/h via INTRAVENOUS

## 2017-03-29 MED ORDER — DEXAMETHASONE 1 MG/ML PO CONCENTRATED *I*
4.0000 mg | Freq: Two times a day (BID) | ORAL | Status: DC
Start: 2017-03-29 — End: 2017-04-01
  Administered 2017-03-29 – 2017-04-01 (×6): 4 mg via NASOGASTRIC
  Filled 2017-03-29 (×8): qty 4

## 2017-03-29 MED ORDER — PHENYLEPHRINE 80 MG (0.32MG/ML) IN NS 250 ML *I*
INTRAVENOUS | Status: DC | PRN
Start: 2017-03-29 — End: 2017-03-29
  Administered 2017-03-29: 20 ug/min via INTRAVENOUS
  Administered 2017-03-29: 40 ug/min via INTRAVENOUS
  Administered 2017-03-29: 50 ug/min via INTRAVENOUS

## 2017-03-29 MED ORDER — POTASSIUM CHLORIDE 10 MEQ/50ML IV SOLN *I*
10.0000 meq | Freq: Once | INTRAVENOUS | Status: AC
Start: 2017-03-29 — End: 2017-03-29
  Administered 2017-03-29: 10 meq via INTRAVENOUS

## 2017-03-29 MED ORDER — ONDANSETRON HCL 2 MG/ML IV SOLN *I*
4.0000 mg | Freq: Three times a day (TID) | INTRAMUSCULAR | Status: AC | PRN
Start: 2017-03-29 — End: 2017-04-01

## 2017-03-29 MED ORDER — KETOROLAC TROMETHAMINE 30 MG/ML IJ SOLN *I*
15.0000 mg | Freq: Four times a day (QID) | INTRAMUSCULAR | Status: DC
Start: 2017-03-29 — End: 2017-04-01
  Administered 2017-03-29 – 2017-04-01 (×10): 15 mg via INTRAVENOUS
  Filled 2017-03-29 (×10): qty 1

## 2017-03-29 MED ORDER — LIDOCAINE HCL 2 % IJ SOLN *I*
INTRAMUSCULAR | Status: DC | PRN
Start: 2017-03-29 — End: 2017-03-29
  Administered 2017-03-29: 60 mg via INTRAVENOUS

## 2017-03-29 MED ORDER — HYDROMORPHONE HCL PF 1 MG/ML IJ SOLN *WRAPPED*
INTRAMUSCULAR | Status: DC | PRN
Start: 2017-03-29 — End: 2017-03-29
  Administered 2017-03-29 (×2): 1 mg via INTRAVENOUS

## 2017-03-29 MED ORDER — PHENOL 1.4 % MT LIQD *I*
2.0000 | OROMUCOSAL | Status: DC | PRN
Start: 2017-03-29 — End: 2017-04-04
  Administered 2017-03-29 – 2017-03-30 (×2): 2 via ORAL
  Filled 2017-03-29: qty 20

## 2017-03-29 MED ORDER — NEOMYCIN-POLYMYXIN B GU 40-200000 IR SOLN *I*
Status: AC
Start: 2017-03-29 — End: 2017-03-29
  Filled 2017-03-29: qty 3

## 2017-03-29 MED ORDER — MORPHINE SULFATE *PF* 1 MG/ML IJ SOLN *I*
INTRAMUSCULAR | Status: DC | PRN
Start: 2017-03-29 — End: 2017-03-29
  Administered 2017-03-29: 9.8 mg via INTRAVENOUS

## 2017-03-29 MED ORDER — PHENYLEPHRINE 100 MCG/ML IN NS 10 ML *WRAPPED*
INTRAMUSCULAR | Status: DC | PRN
Start: 2017-03-29 — End: 2017-03-29
  Administered 2017-03-29 (×3): 100 ug via INTRAVENOUS

## 2017-03-29 MED ORDER — HALOPERIDOL LACTATE 5 MG/ML IJ SOLN *I*
0.5000 mg | Freq: Once | INTRAMUSCULAR | Status: DC | PRN
Start: 2017-03-29 — End: 2017-03-29

## 2017-03-29 MED ORDER — SODIUM CHLORIDE 0.9 % IR SOLN *I*
Status: DC | PRN
Start: 2017-03-29 — End: 2017-03-29
  Administered 2017-03-29: 3 mL

## 2017-03-29 MED ORDER — PREGABALIN 150 MG PO CAPS *I*
150.0000 mg | ORAL_CAPSULE | Freq: Every evening | ORAL | Status: AC
Start: 2017-03-29 — End: 2017-04-05
  Administered 2017-03-29 – 2017-04-03 (×5): 150 mg via ORAL
  Filled 2017-03-29 (×6): qty 1

## 2017-03-29 MED ORDER — ERTAPENEM IN NS 1 GM *I*
1000.0000 mg | Freq: Once | INTRAVENOUS | Status: AC
Start: 2017-03-29 — End: 2017-03-29
  Administered 2017-03-29: 1000 mg via INTRAVENOUS

## 2017-03-29 MED ORDER — HYDROMORPHONE PCA 1 MG/ML *WRAPPED*
INTRAMUSCULAR | Status: AC
Start: 2017-03-29 — End: 2017-03-29
  Filled 2017-03-29: qty 25

## 2017-03-29 MED ORDER — DIPHENHYDRAMINE HCL 50 MG/ML IJ SOLN *I*
25.0000 mg | Freq: Four times a day (QID) | INTRAMUSCULAR | Status: AC | PRN
Start: 2017-03-29 — End: 2017-03-30

## 2017-03-29 MED ORDER — ONDANSETRON HCL 2 MG/ML IV SOLN *I*
INTRAMUSCULAR | Status: DC | PRN
Start: 2017-03-29 — End: 2017-03-29
  Administered 2017-03-29: 4 mg via INTRAMUSCULAR

## 2017-03-29 MED ORDER — FENTANYL CITRATE 50 MCG/ML IJ SOLN *WRAPPED*
INTRAMUSCULAR | Status: DC | PRN
Start: 2017-03-29 — End: 2017-03-29
  Administered 2017-03-29: 50 ug via INTRAVENOUS
  Administered 2017-03-29: 200 ug via INTRAVENOUS
  Administered 2017-03-29: 100 ug via INTRAVENOUS
  Administered 2017-03-29: 50 ug via INTRAVENOUS

## 2017-03-29 MED ORDER — FENTANYL CITRATE 50 MCG/ML IJ SOLN *WRAPPED*
INTRAMUSCULAR | Status: AC
Start: 2017-03-29 — End: 2017-03-29
  Filled 2017-03-29: qty 4

## 2017-03-29 MED ORDER — PROPOFOL 10 MG/ML IV EMUL (INTERMITTENT DOSING) WRAPPED *I*
INTRAVENOUS | Status: DC | PRN
Start: 2017-03-29 — End: 2017-03-29
  Administered 2017-03-29: 150 mg via INTRAVENOUS
  Administered 2017-03-29: 50 mg via INTRAVENOUS

## 2017-03-29 MED ORDER — NALBUPHINE HCL 10 MG/ML IJ SOLN *I*
3.0000 mg | INTRAMUSCULAR | Status: AC | PRN
Start: 2017-03-29 — End: 2017-03-30
  Filled 2017-03-29 (×2): qty 0.3

## 2017-03-29 MED ORDER — HYDROMORPHONE PCA 1 MG/ML *WRAPPED*
INTRAMUSCULAR | Status: DC
Start: 2017-03-29 — End: 2017-04-02
  Filled 2017-03-29 (×4): qty 25

## 2017-03-29 MED ORDER — ROCURONIUM BROMIDE 10 MG/ML IV SOLN *WRAPPED*
Status: DC | PRN
Start: 2017-03-29 — End: 2017-03-29
  Administered 2017-03-29: 50 mg via INTRAVENOUS
  Administered 2017-03-29 (×3): 20 mg via INTRAVENOUS
  Administered 2017-03-29: 10 mg via INTRAVENOUS
  Administered 2017-03-29: 50 mg via INTRAVENOUS
  Administered 2017-03-29: 10 mg via INTRAVENOUS

## 2017-03-29 MED ORDER — PANTOPRAZOLE SODIUM 40 MG IV SOLR *I*
40.0000 mg | Freq: Every morning | INTRAVENOUS | Status: DC
Start: 2017-03-30 — End: 2017-03-30
  Administered 2017-03-30: 40 mg via INTRAVENOUS
  Filled 2017-03-29: qty 10

## 2017-03-29 MED ORDER — HYDROMORPHONE HCL 2 MG/ML IJ SOLN *WRAPPED*
INTRAMUSCULAR | Status: DC
Start: 2017-03-29 — End: 2017-03-30
  Filled 2017-03-29: qty 1

## 2017-03-29 MED ORDER — MORPHINE SULFATE 2 MG/ML IV SOLN *WRAPPED*
Status: DC | PRN
Start: 2017-03-29 — End: 2017-03-29
  Administered 2017-03-29: 0.2 mg via INTRATHECAL

## 2017-03-29 MED ORDER — LIDOCAINE HCL (PF) 1 % IJ SOLN *I*
0.1000 mL | INTRAMUSCULAR | Status: DC | PRN
Start: 2017-03-29 — End: 2017-03-29

## 2017-03-29 MED ORDER — HYDROMORPHONE HCL 2 MG/ML IJ SOLN *WRAPPED*
1.0000 mg | Freq: Once | INTRAMUSCULAR | Status: AC
Start: 2017-03-29 — End: 2017-03-29
  Administered 2017-03-29: 1 mg via INTRAVENOUS

## 2017-03-29 MED ORDER — NALOXONE HCL 0.4 MG/ML IJ SOLN *WRAPPED*
0.1000 mg | Status: DC | PRN
Start: 2017-03-29 — End: 2017-03-30

## 2017-03-29 MED ORDER — PLASMA-LYTE IV SOLN *WRAPPED*
Status: DC | PRN
Start: 2017-03-29 — End: 2017-03-29

## 2017-03-29 MED ORDER — LACTATED RINGERS IV SOLN *I*
20.0000 mL/h | INTRAVENOUS | Status: DC
Start: 2017-03-29 — End: 2017-03-29
  Administered 2017-03-29: 20 mL/h via INTRAVENOUS

## 2017-03-29 MED ORDER — MORPHINE SULFATE *PF* 1 MG/ML IJ SOLN *I*
INTRAMUSCULAR | Status: AC
Start: 2017-03-29 — End: 2017-03-29
  Filled 2017-03-29: qty 10

## 2017-03-29 MED ORDER — KETOROLAC TROMETHAMINE 30 MG/ML IJ SOLN *I*
INTRAMUSCULAR | Status: AC
Start: 2017-03-29 — End: 2017-03-29
  Filled 2017-03-29: qty 1

## 2017-03-29 SURGICAL SUPPLY — 77 items
APPLICATOR CHLORAPREP 26ML ORANGE LARGE (Solution) ×2 IMPLANT
APPLICATOR Q-TIP PLAST 6IN STER (Supply) ×4 IMPLANT
BAG ZIPLOCK BIOHAZ W/POUCH 6X9 USE 246168 (Supply) ×8 IMPLANT
BOOT SUTR LG LF PINK (Supply) ×2 IMPLANT
BULB J-P HEMOVAC (Supply) ×1
CLIP HEMO TITANIUM MED (Supply) ×2 IMPLANT
CLIP HEMO TITANIUM STERILE LG (Supply) ×2 IMPLANT
CONTAINER SPEC LEAKPRF 3 OZ (Supply) ×6 IMPLANT
CONTAINER SPEC PLAST W/LID 84OZ (Supply) ×2 IMPLANT
COUNTER NEEDLE LG 40 COUNT LF (Supply) ×2 IMPLANT
COVER OR LIGHT DISP CAMERA (Supply) ×2 IMPLANT
CUTTER RELOAD LINEAR 75MM GREEN (Supply) ×2 IMPLANT
CUTTER RELOAD LINEAR THICK 75MM (Supply) ×2 IMPLANT
DRAIN PENROSE 18 X .25IN STER LTX (Supply) ×2 IMPLANT
DRAIN WOUND RND SIL 15 FR HUBLESS TROCAR (Supply) ×2 IMPLANT
DRAPE SHEET 40X70 MED (Drape) ×4 IMPLANT
DRAPE STERI INST.POUCH (Drape) ×4 IMPLANT
DRAPE SURG IOBAN 2 ANTIMIC XL 23 X 33IN (Drape) ×2 IMPLANT
DRESSING TELFA 8X3 RELEASE (Dressing) ×2 IMPLANT
FILTER NEPTUNE 4PORT MANIFOLD (Supply) ×2 IMPLANT
GEL ULTRASOUND STER 20GM (Supply) IMPLANT
GLOVE BIOGEL PI MICRO SZ 6.5 (Glove) ×16 IMPLANT
GLOVE LINER BIOGEL INDICATOR SZ7 LTX (Glove) ×2 IMPLANT
GLOVE SURG BIOGEL PI ULTRATOUCH SZ 6.5 (Glove) ×4 IMPLANT
GOWN SIRIUS PLY REINF BRTH SLV XL XLONG (Gown) ×2 IMPLANT
GOWN SIRIUS RAGLAN NONREINFORCED L (Gown) ×2 IMPLANT
INSTRUMENT LIGASURE IMPACT OPEN TISSUE (Supply) ×2 IMPLANT
LOOP VASC MAXI WHITE (Supply) ×2 IMPLANT
NEEDLE FILTER 5 MICRON 19G (Needle) ×1 IMPLANT
NEEDLE FILTER 5 MICRON 19G 19G (Needle) ×1
NEEDLE HYPO SAFETYGLIDE 22G X 1.5IN (Needle) ×1
NEEDLE HYPO SAFETYGLIDE 22G X 1.5IN GREEN (Needle) ×1 IMPLANT
PACK CUSTOM MAJOR PROCEDURE (Pack) ×2 IMPLANT
PACK TOWEL ORTHO LIGHT BLUE STER (Supply) ×2 IMPLANT
RELOAD LINEAR CUTTER BLUE 75MM (Supply) ×8 IMPLANT
RESERVOIR DRNGE 100CC CLR SIL SUCT BLB W/ ANTI REFLX VLV JP (Supply) ×1 IMPLANT
SOL SODIUM CHLORIDE IRRIG 1000ML BTL (Solution) ×6 IMPLANT
SOL WATER IRRIG STERILE 1000ML BTL (Solution) IMPLANT
SPONGE GAUZE POST-OP 3X4IN LF STER (Dressing) ×2 IMPLANT
SPONGE GZE 4X4 STER 10IN S 12 (Dressing) ×2 IMPLANT
SPONGE LAP X-RAY DETEC 18X18IN (Sponge) ×12 IMPLANT
SPONGE SURGICEL ABS 4 X 8IN (Sponge) ×2 IMPLANT
STAPLER ENDOSCP 60 WHT BLU G GRN BLK SEAMGRD BIOABSRB STPL LN REINF ECHELON ENDOPATH (Supply) IMPLANT
STAPLER LINEAR TX30 3.5MM (Supply) ×2 IMPLANT
STAPLER SKIN 35W NL (Supply) ×1
STAPLER SKIN WIDE STPL LEG L3.9MM WIRE DIA0.58MM FIX HD PROX (Supply) ×1 IMPLANT
SUCTION YANKAUER HIGH CAP. (Supply) ×2 IMPLANT
SUTR ETHILON MONO 2-0 30IN BLACK (Suture) ×2 IMPLANT
SUTR MAXON 4-0 CV-23 TAPER NEEDLE BR GR (Suture) IMPLANT
SUTR MAXON CV 5-0 GR 24 CV-11DA (Suture) ×10 IMPLANT
SUTR PDS II 1 MONO TP-1 54 VIOLET (Suture) ×2 IMPLANT
SUTR PDS II 4-0 MONO RB-1 27IN VIOLET (Suture) IMPLANT
SUTR PDS II MONO 1 TP-1 96IN VIOLET (Suture) IMPLANT
SUTR PDS II MONO 4-0 RB1 27 VIOLET (Suture) ×30 IMPLANT
SUTR PROLENE 3-0 SH BL MONO (Suture) ×6 IMPLANT
SUTR PROLENE 4-0 V-5 BL MONO (Suture) ×8 IMPLANT
SUTR PROLENE MONO 4-0 RB-1 BLUE (Suture) ×2 IMPLANT
SUTR PROLENE MONO 5-0 RB-1 BLUE (Suture) ×2 IMPLANT
SUTR SILK 2-0 SH 30 IN BLACK (Suture) ×4 IMPLANT
SUTR SILK 2-0 TIE 30 IN BLACK (Suture) ×2 IMPLANT
SUTR SILK 3-0 SH POP 18IN BLACK (Suture) ×8 IMPLANT
SUTR VICRYL 3-0 SH CT 27 IN UNDYED (Suture) IMPLANT
SUTR VICRYL ANTIB 3-0 SH 27 UNDY (Suture) ×4 IMPLANT
SYRINGE LUER SLIP 5CC LF (Supply) ×2 IMPLANT
SYRINGE LUERLOCK 10ML INDIVIDUAL WRAP (Supply) ×2 IMPLANT
SYRINGE MED 5CC W OUT NDL LUERSLIP CLEAR LF (Supply) ×1 IMPLANT
TAPE UMBILICAL COTTON 1/8 X 30IN STER (Supply) IMPLANT
TIP SUCT PLAS RETRCT SCRN 1 HND THUMP CLICK SLDE OP W/ TBNG POOLE (Supply) ×1 IMPLANT
TIP SUCT POOLE STR (Supply) ×1
TIP SUCT YANK FLEX FINE TIP (Supply) ×2 IMPLANT
TOWEL SURG COTTON O.R. DISP 17X26IN WH (Supply) ×2 IMPLANT
TRAY FOLEY SIL PREC400 PREM U/M 14FR (Other) ×2 IMPLANT
TUBE FEEDING ADULT/PED 8FR X 42IN (Supply) IMPLANT
TUBE FEEDING JEJUNAL 24FR (Supply) IMPLANT
TUBE HOLDER, CATH-SECURE STD LUMEN (Supply) IMPLANT
TUBE TRANSGASTRIC JEJUNAL 18FR (Supply) IMPLANT
TUBING NONCONDUC CONN 12FT X 3/16IN (Tubing) ×4 IMPLANT

## 2017-03-29 NOTE — Anesthesia Procedure Notes (Signed)
---------------------------------------------------------------------------------------------------------------------------------------    AIRWAY   GENERAL INFORMATION AND STAFF    Patient location during procedure: OR       Date of Procedure: 03/29/2017 9:39 AM  CONDITION PRIOR TO MANIPULATION     Current Airway/Neck Condition:  Normal        For more airway physical exam details, see Anesthesia PreOp Evaluation  AIRWAY METHOD     Patient Position:  Sniffing    Preoxygenated: yes      Induction: IV    Mask Difficulty Assessment:  1 - vent by mask      Mask NMB: 1 - vent by mask      Technique Used for Successful ETT Placement:  Video laryngoscopy    Devices/Methods Used in Placement:  Intubating stylet and anterior pressure/BURP    Blade Type:  Macintosh    Laryngoscope Blade/Video laryngoscope Blade Size:  3    Cormack-Lehane Classification:  Grade IIb - view of arytenoids or posterior of glottis only    Placement Verified by: capnometry, auscultation and equal breath sounds      Number of Attempts at Approach:  2    Ventilation Between Attempts:  Bag valve mask  FINAL AIRWAY DETAILS    Final Airway Type:  Endotracheal airway    Adjunct Airway: soft bite block    Final Endotracheal Airway:  ETT      Cuffed: cuffed    Insertion Site:  Oral    ETT Size (mm):  7.5    Distance inserted from Teeth (cm):  21  ADDITIONAL COMMENTS   Patient with known difficult glidescope (Grade IIb view) in the past. Opted to start with glidescope. OMFS resident attempted first with grade III view. Easy bag mask between attempts and second attempt by anesthesia attending. Grade IIb view achieved. Attempted to intubate initially with bougie, but given acute angle was not able. Utilized posterior pressure on airway and stylet to pass the ETT through vocal cords. Teeth intact in pre op condition.   ----------------------------------------------------------------------------------------------------------------------------------------

## 2017-03-29 NOTE — Progress Notes (Signed)
Assumed care of pt from J. Six RN. See flow sheet for complete assessment. Having large amounts of pain and is medicated per [facility] with poor relief. PCA started and Pt finally able to say that she is more comfortable. NGT is set to LIWS and draining small amounts of bilious drainage. Foley catheter is draining adequate amounts of clear yellow urine. A line is positional and dampens at time but working appropriately. Denies any needs at this time. Report was called to [employer] on 814. Pt transported on portable cardiac monitoring and 2L O2 via nasal cannula accompanied by RN. Casey Burkitt, RN

## 2017-03-29 NOTE — Anesthesia Procedure Notes (Addendum)
----------------------------------------------------------------------------------------------------------------------------------------    ARTERIAL LINE     Date of Procedure: 03/29/2017 9:00 AM  PLACEMENT     Final Insertion site: left radial    Catheter Length: 20G 4.45cm     Number of attempts:  1  INDICATIONS     Indications: Hemodynamic Monitoring, Multiple ABG's and Prolonged Procedure    CONSENT AND TIMEOUT     Consent:  Obtained per policy    Timeout: patient identified (name/DOB) , nerve block procedure/site/side verified against block consent form  and proper patient position verified  PROCEDURE DETAILS       Patient Location: OR      Sedation used: under GA      Monitoring: full ASA monitors      Sterile Technique: hand sanitizer, sterile gloves and mask      Site Preparation:  Chlorhexidine+isopropanol      Technique:  Thru and thru, Arrow catheter and over wire    Guide Wire removed Intact      Placement confirmed by:  Transducer and pulsatile flow      Secured by:  Tape, transparent dressing and biopatch  POST-PROCEDURE DETAILS     Complications:  None, well-tolerated    Patient condition at end of procedure:  No change from prior  STAFF     Performed by: extender under direct supervision    Attending Attestation: I was present for the entire procedure     Attending: Ardath Sax  Extender: [place]  ----------------------------------------------------------------------------------------------------------------------------------------

## 2017-03-29 NOTE — INTERIM OP NOTE (Addendum)
Interim Op Note (Surgical Log ID: [id number])       Date of Surgery: 03/29/2017       Surgeons: Juliann Mule) and Role:     * Delano Metz, MD - Primary     * [name], MD - Resident - Assisting     * Riki Altes, DO - Resident - Assisting       Pre-op Diagnosis: Pre-Op Diagnosis Codes:     * Malignant neoplasm of pancreas, unspecified location of malignancy [C25.9]       Post-op Diagnosis: Post-Op Diagnosis Codes:     * Malignant neoplasm of pancreas, unspecified location of malignancy [C25.9]       Procedure(s) Performed: Procedure:    WHIPPLE PROCEDURE  CPT(R) Code:  34196 - PR PART REMV PANC,PROX+REMV DUOD+ANAST         Additional CPT Codes:        Anesthesia Type: General        Fluid Totals: I/O this shift:  01/25 1500 - 01/25 2259  In: 300 (4.2 mL/kg) [I.V.:300]  Out: 300 (4.2 mL/kg) [Urine:300]  Net: 0  Weight: 72 kg        Estimated Blood Loss: 300 mL       Specimens to Pathology:  ID Type Source Tests Collected by Time Destination   1 : bile EXUDATE Bile ANAEROBIC CULTURE, AEROBIC CULTURE, AFB CULTURE Karleen Hampshire, RN 03/29/2017 1205    A : omentum and whipple specimen TISSUE Tissue SURGICAL PATHOLOGY Michaelene Song, RN 03/29/2017 1243    B : perineural tissue surrounding SMA TISSUE Tissue SURGICAL PATHOLOGY Michaelene Song, RN 03/29/2017 1119    C : true uncinate margin TISSUE Tissue SURGICAL PATHOLOGY Michaelene Song, RN 03/29/2017 1246    D : Meckel's diverticulum TISSUE Tissue SURGICAL PATHOLOGY Michaelene Song, RN 03/29/2017 1308           Temporary Implants:        Packing:                 Patient Condition: guarded       Findings (Including unexpected complications): No gross evidence of metastatic disease. Firm tissue along SMA margin frozen analysis showing inflammatory cells without malignancy.      Signed:  [name], MD  on 03/29/2017 at 5:16 PM

## 2017-03-29 NOTE — H&P (Addendum)
SICU History and Physical Note    Date seen: 03/29/17    CC: s/p whipple monitoring    HPI: Amanda Galloway is a 72 y.o. female with PMHx significant for pancreatic adenocarcinoma now s/p open Whipple on 03/29/17, also with depression, Fibromyalgia, and hypothyroidism. She had an open whipple done today, which was uncomplicated. 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    ROS : Review of Systems   Constitutional: Negative for chills and fever.   HENT: Negative.    Eyes: Negative.    Respiratory: Negative.    Cardiovascular: Negative.  Negative for chest pain.   Gastrointestinal: Positive for abdominal pain. Negative for blood in stool, constipation, diarrhea, melena, nausea and vomiting.   Genitourinary: Negative.    Musculoskeletal: Positive for myalgias.   Skin: Negative.    Neurological: Negative.    Psychiatric/Behavioral: Negative.          Past Medical History:  Past Medical History:   Diagnosis Date    Cancer     Depression     Fibromyalgia     Hypothyroidism        Past Surgical History:  Past Surgical History:   Procedure Laterality Date    CHOLECYSTECTOMY      CHOLECYSTECTOMY, LAPAROSCOPIC  09/06/2016    HYSTERECTOMY  08/1986    Fibroids    KNEE SURGERY Right     PR INSERT TUNNELED CV CATH WITH PORT Right 10/04/2016    Procedure: Right IJ MEDIPORT Insertion;  Surgeon: Delano Metz, MD;  Location: [facility] MAIN OR;  Service: Oncology General    PR LAP,DIAGNOSTIC ABDOMEN N/A 10/04/2016    Procedure: LAPAROSCOPY DIAGNOSTIC;  Surgeon: Delano Metz, MD;  Location: [facility] MAIN OR;  Service: Oncology General    ROTATOR CUFF REPAIR Right     TONSILLECTOMY AND ADENOIDECTOMY         No Known Allergies (drug, envir, food or latex)    Social Hx:  Social History     Social History    Marital status: Divorced     Spouse name: N/A    Number of children: N/A    Years of education: N/A      Occupational History    Not on file.     Social History Main Topics    Smoking status: Never Smoker    Smokeless tobacco: Never Used    Alcohol use No    Drug use: No    Sexual activity: Not on file     Social History Narrative    No narrative on file         Family Hx:   Family History   Problem Relation Age of Onset    Breast cancer Mother         died 25    Cancer Father         NHL, died 64    Heart Disease Father     Diabetes Sister     Obesity Sister         4 siblings       Labs  Prescriptions Prior to Admission   Medication Sig    methadone (DOLOPHINE) 5 MG tablet Take 15 mg by mouth nightly    LORazepam (ATIVAN) 0.5 MG tablet Take 1 tablet (0.5 mg total) by mouth 2 times daily as needed for Anxiety   Max daily dose: 1 mg (Patient taking differently:  Take 0.5 mg by mouth 2 times daily as needed for Anxiety   0.5mg  nightly and prn)    methadone (DOLOPHINE) 5 MG tablet Take 20mg  in the morning, 20mg  in afternoon and 15mg  at night. (Patient taking differently: 10 mg 2 times daily   Take 20mg  in the morning, 20mg  in afternoon and 15mg  at night.)    enoxaparin (LOVENOX) 100 mg/mL injection Inject 1 Syringe (100 mg total) into the skin daily (Patient taking differently: Inject 100 mg into the skin 2 times daily )    potassium chloride SA (KLOR-CON M20) 20 mEq  tablet Take 20 mEq by mouth 2 times daily    famotidine (PEPCID) 20 MG tablet Take 1 tablet (20 mg total) by mouth 2 times daily    escitalopram (LEXAPRO) 20 MG tablet Take 1 tablet (20 mg total) by mouth daily (Patient taking differently: Take 20 mg by mouth nightly   )    amitriptyline (ELAVIL) 25 MG tablet Take 1 tablet (25 mg total) by mouth nightly    dexamethasone (DECADRON) 4 MG tablet Take 1 tablet (4 mg total) by mouth 2 times daily (Patient taking differently: Take 4 mg by mouth daily (with breakfast)   )    docusate sodium (COLACE) 100 MG capsule Take 2 capsules (200 mg total) by mouth 2 times daily    metoclopramide  (REGLAN) 5 MG tablet Take 1 tablet (5 mg total) by mouth 3 times daily (before meals)    pantoprazole (PROTONIX) 40 MG EC tablet Take 1 tablet (40 mg total) by mouth 2 times daily (before meals)   Swallow whole. Do not crush, break, or chew.    senna (SENOKOT) 8.6 MG tablet Take 2 tablets by mouth 2 times daily    PREMARIN 0.625 MG tablet Take 0.625 mg by mouth every morning       ondansetron (ZOFRAN-ODT) 4 MG disintegrating tablet Take 1 tablet (4 mg total) by mouth 3 times daily as needed for Nausea   Place on top of tongue.    levothyroxine (SYNTHROID, LEVOTHROID) 137 MCG tablet Take 1 tablet (137 mcg total) by mouth daily (before breakfast)    Vitamins - senior (CENTRUM SILVER) TABS Take 1 tablet by mouth daily (Patient taking differently: Take 1 tablet by mouth every morning   )    triamterene-hydrochlorothiazide (MAXZIDE-25) 37.5-25 MG per tablet Take 1 tablet by mouth daily (Patient taking differently: Take 1 tablet by mouth every morning   )    fluorouracil (ADRUCIL) 50 MG/ML injection Administer 3,477 mg into the vein every 14 days   Start in infusion center.  Infuse over 46 hours at home.    pegfilgrastim (NEULASTA) 6 MG/0.6ML injection syringe Inject 0.6 mLs (6 mg total) into the skin once    Meds Exist in Therapy Plan Peg-Filgrastim    heparin 100 UNIT/ML injection 5 mLs (500 Units total) by Intracatheter route as needed (for line care)    sodium chloride 0.9 % flush 10 mLs by Intracatheter route as needed (for line care)       Current Facility-Administered Medications   Medication Dose Route Frequency    Lactated Ringers Infusion  20 mL/hr Intravenous Continuous    sodium chloride 0.9% IV  20 mL/hr Intravenous Continuous    Lidocaine HCl 1 % SOLN 0.1 mL  0.1 mL Subcutaneous PRN    Lactated Ringers Infusion  100 mL/hr Intravenous Continuous    Lactated Ringers Infusion  84 mL/hr Intravenous Continuous    acetaminophen (OFIRMEV) SOLN  1,000 mg  1,000 mg Intravenous Q8H    naloxone  (NARCAN) 0.4 mg/mL injection 0.1 mg  0.1 mg Intravenous Q5 Min PRN    HYDROmorphone (DILAUDID) PCA 1 mg/mL   Intravenous Continuous    HYDROmorphone (DILAUDID) injection 0.5 mg  0.5 mg Intravenous Q5 Min PRN    haloperidol lactate (HALDOL) injection 0.5 mg  0.5 mg Intravenous Once PRN    promethazine (PHENERGAN) injection 6.25 mg  6.25 mg Intravenous Once PRN    naloxone (NARCAN) 0.4 mg/mL injection 0.1 mg  0.1 mg Intravenous PRN    diphenhydrAMINE (BENADRYL) 50 mg/mL injection 25 mg  25 mg Intravenous Q6H PRN    nalbuphine (NUBAIN) injection 3 mg  3 mg Intravenous Q3H PRN    ketorolac (TORADOL) 30 mg/mL injection 15 mg  15 mg Intravenous Q6H       Lab Results:   All labs in the last 24 hours:   Recent Results (from the past 24 hour(s))   POCT glucose    Collection Time: 03/29/17  6:51 AM   Result Value Ref Range    Glucose POCT 95 60 - 99 mg/dL   Type and screen    Collection Time: 03/29/17  6:57 AM   Result Value Ref Range    ABO RH Blood Type A RH NEG     Antibody Screen Negative    Red blood cells    Collection Time: 03/29/17  9:32 AM   Result Value Ref Range    Blood product type Red Blood Cells     Unit Number [id number]     Dispense status Issued     Product code [id number]     Component blood type A Neg     Coding system [id number]     Coded Blood type 0600     Blood product type Red Blood Cells     Unit Number [id number]     Dispense status Issued     Product code [phone]     Component blood type A Neg     Coding system [id number]     Coded Blood type 0600    Blood gases and whole blood analytes arterial    Collection Time: 03/29/17  9:59 AM   Result Value Ref Range    HCT (Calc) 31 (L) 34 - 45 %    Hemoglobin 10.6 (L) 11.2 - 15.7 g/dL    pH 7.47 (H) 7.36 - 7.44    pCO2, Arterial 34 33 - 43 mm Hg    pO2,Arterial 148 (H) 80 - 100 mm Hg    HCO3, Arterial 24 (H) 19 - 23 mmol/L    Base Excess, Arterial 1 -2 - 2 mmol/L    CO2,ART (Calc) 25 21 - 28 mmol/L    FO2 Hb, Arterial 97 (H) 90 - 95 %    CO 0.7 %     Methemoglobin 0.3 0.0 - 1.0 %    NA, WB 133 (L) 135 - 145 mmol/L    Potassium,WB 2.8 (LL) 3.4 - 4.7 mmol/L    Chloride,WB 98 98 - 108 mmol/L    ICA Uncorr,WB 3.9 mg/dL    ICA @7 .4,WB 4.1 (L) 4.8 - 5.2 mg/dL    Glucose,WB 93 60 - 99 mg/dL    Anion Gap,WB 13 7 - 16    Lactate ART,WB 1.2 (H) 0.3 - 0.8 mmol/L   Blood gases and whole blood analytes arterial    Collection Time: 03/29/17 11:47 AM   Result Value Ref Range  HCT (Calc) 34 34 - 45 %    Hemoglobin 11.5 11.2 - 15.7 g/dL    pH 7.40 7.36 - 7.44    pCO2, Arterial 38 33 - 43 mm Hg    pO2,Arterial 207 (H) 80 - 100 mm Hg    HCO3, Arterial 23 19 - 23 mmol/L    Base Excess, Arterial -2 -2 - 2 mmol/L    CO2,ART (Calc) 24 21 - 28 mmol/L    FO2 Hb, Arterial 98 (H) 90 - 95 %    CO 0.6 %    Methemoglobin 0.3 0.0 - 1.0 %    NA, WB 133 (L) 135 - 145 mmol/L    Potassium,WB 3.0 (L) 3.4 - 4.7 mmol/L    Chloride,WB 97 (L) 98 - 108 mmol/L    ICA Uncorr,WB 4.5 mg/dL    ICA @7 .4,WB 4.5 (L) 4.8 - 5.2 mg/dL    Glucose,WB 176 (H) 60 - 99 mg/dL    Anion Gap,WB 16 7 - 16    Lactate ART,WB 1.5 (H) 0.3 - 0.8 mmol/L   Gram stain    Collection Time: 03/29/17 12:05 PM   Result Value Ref Range    Gram Stain .    Blood gases and whole blood analytes arterial    Collection Time: 03/29/17  1:20 PM   Result Value Ref Range    HCT (Calc) 28 (L) 34 - 45 %    Hemoglobin 9.4 (L) 11.2 - 15.7 g/dL    pH 7.37 7.36 - 7.44    pCO2, Arterial 39 33 - 43 mm Hg    pO2,Arterial 192 (H) 80 - 100 mm Hg    HCO3, Arterial 22 19 - 23 mmol/L    Base Excess, Arterial -3 (L) -2 - 2 mmol/L    CO2,ART (Calc) 23 21 - 28 mmol/L    FO2 Hb, Arterial 98 (H) 90 - 95 %    CO 0.9 %    Methemoglobin 0.3 0.0 - 1.0 %    NA, WB 130 (L) 135 - 145 mmol/L    Potassium,WB 3.1 (L) 3.4 - 4.7 mmol/L    Chloride,WB 99 98 - 108 mmol/L    ICA Uncorr,WB 4.1 mg/dL    ICA @7 .4,WB 4.1 (L) 4.8 - 5.2 mg/dL    Glucose,WB 163 (H) 60 - 99 mg/dL    Anion Gap,WB 12 7 - 16    Lactate ART,WB 4.2 (HH) 0.3 - 0.8 mmol/L   Blood gases and whole blood  analytes arterial    Collection Time: 03/29/17  4:03 PM   Result Value Ref Range    HCT (Calc) 30 (L) 34 - 45 %    Hemoglobin 10.3 (L) 11.2 - 15.7 g/dL    pH 7.38 7.36 - 7.44    pCO2, Arterial 36 33 - 43 mm Hg    pO2,Arterial 232 (H) 80 - 100 mm Hg    HCO3, Arterial 21 19 - 23 mmol/L    Base Excess, Arterial -4 (L) -2 - 2 mmol/L    CO2,ART (Calc) 22 21 - 28 mmol/L    FO2 Hb, Arterial 98 (H) 90 - 95 %    CO 0.6 %    Methemoglobin 0.3 0.0 - 1.0 %    NA, WB 131 (L) 135 - 145 mmol/L    Potassium,WB 3.6 3.4 - 4.7 mmol/L    Chloride,WB 99 98 - 108 mmol/L    ICA Uncorr,WB 4.0 mg/dL    ICA @7 .4,WB 4.0 (L) 4.8 -  5.2 mg/dL    Glucose,WB 196 (H) 60 - 99 mg/dL    Anion Gap,WB 15 7 - 16    Lactate ART,WB 7.5 (HH) 0.3 - 0.8 mmol/L   Blood gases and whole blood analytes arterial    Collection Time: 03/29/17  4:30 PM   Result Value Ref Range    HCT (Calc) 29 (L) 34 - 45 %    Hemoglobin 9.9 (L) 11.2 - 15.7 g/dL    pH 7.37 7.36 - 7.44    pCO2, Arterial 34 33 - 43 mm Hg    pO2,Arterial 218 (H) 80 - 100 mm Hg    HCO3, Arterial 20 19 - 23 mmol/L    Base Excess, Arterial -5 (L) -2 - 2 mmol/L    CO2,ART (Calc) 21 21 - 28 mmol/L    FO2 Hb, Arterial 98 (H) 90 - 95 %    CO 0.7 %    Methemoglobin 0.3 0.0 - 1.0 %    NA, WB 133 (L) 135 - 145 mmol/L    Potassium,WB 3.5 3.4 - 4.7 mmol/L    Chloride,WB 99 98 - 108 mmol/L    ICA Uncorr,WB 4.3 mg/dL    ICA @7 .4,WB 4.3 (L) 4.8 - 5.2 mg/dL    Glucose,WB 188 (H) 60 - 99 mg/dL    Anion Gap,WB 18 (H) 7 - 16    Lactate ART,WB 6.8 (HH) 0.3 - 0.8 mmol/L     *Note: Due to a large number of results and/or encounters for the requested time period, some results have not been displayed. A complete set of results can be found in Results Review.       Radiology impressions (last 3 days):  No results found.    Vital Signs:  BP 98/73 (BP Location: Left arm)    Pulse 107    Temp 36.3 C (97.3 F) (Temporal)    Resp 16    Ht 1.702 m (5\' 7" )    Wt 72 kg (158 lb 11.7 oz)    SpO2 100%    BMI 24.86 kg/m     Physical Exam  by Systems:  Constitutional: NAD, complaining of pain  HEENT: NCAT, EOMI, PERRLA  Cardiovascular: RRR, no mrg  Respiratory: CTA b/l  Gastrointestinal: S, ND, + BS, TTP about incision  Genitourinary: foley  Musculoskeletal: moves all 4 extremities  Skin: wwp  Neurologic: grossly intact  Psychiatric: calm, sedated    Assessment and Plan for Active/Followed Hospital Problems:   Active Hospital Problems    Diagnosis    *!*Pancreatic adenocarcinoma s/p Whipple 03/29/17     --S/p open Whipple on 1/25 with Dr. Ron Agee  --Duramorph spinal during case  --dPCA for post op pain  --Tylenol 1g q6h scheduled  --Toradol 15mg  q6h for 3 days scheduled  --Lyrica 150mg  QHS  --Sips and ice chips  --LR 84cc/h while NPO  --ISS      Acute blood loss anemia     --On admission to SICU H/H 10.2/34 (Basline Hct 30-40s)  --Will monitor      Hypothyroidism     --on synthroid  --will restart in the morning      Cancer associated pain     --Methadone at home--taking 10mg  BID   --dPCA for post op pain 0.3mg  q43min prn  --Tylenol 1g q6h scheduled  --Toradol 15mg  q6h for 3 days scheduled  --Lyrica 150mg  QHS          ---Critical Care Bundle---    Fluids & Electrolytes: 84cc/h LR  Nutrition: Sips and chips    Mobility Plan/Ability: as tolerated    Neuro/sedation/pain: dPCA, Tylenol ATC, Toradol ATC, Lyrica    List of antimicrobials: none    Drains / Lines / Tubes:     Foley: yes    DVT prophylaxis: SCDs    Indication for gastric acid suppression: none    Type and frequency of Labs: per primary team    Family updates/concerns:     Code Status: Full    Disposition: PCU    Author: Glendell Docker, MD  as of: 03/29/2017  at: 6:13 PM     I saw and evaluated the patient. I agree with the resident's/fellow's findings and plan of care as documented above.    Lorenza Chick, MD

## 2017-03-29 NOTE — Anesthesia Procedure Notes (Addendum)
---------------------------------------------------------------------------------------------------------------------------------------    NEURAXIAL BLOCK PLACEMENT  Single Shot Spinal    Date of Procedure: 03/29/2017 10:20 AM    Patient Location:  OR    Reason for Block: post-op pain management and at surgeon's request    CONSENT AND TIMEOUT     Consent:  Obtained per policy  METHOD:    Patient Position: sitting    Monitoring: continuous pulse oximetry    Sedation Used: no            For medications used, please see MAR    Level of Sedation: none      Prep: aseptic technique per protocol and povidone-iodine      Successful Approach: midline    Successful Location: T3-4    Attempts (Skin Punctures):  2    Attempted Approach:  midline    Attempted Location: L3-4  SPINAL NEEDLE:      Introducer: 20 G    Type: Pencan    Gauge: 25 G    Length: 3.5 in    CSF Appearance: clear  OBSERVATIONS:    Block Completion:  Successfully completed    Paresthesia: none  Neuraxial Blood: blood not aspirated      Patient Reaction to Block: tolerated procedure well  STAFF     Performed by: extender under direct supervision    Attending Attestation: I was present for the entire procedure     Attending: Ardath Sax  Extender: [place]  ----------------------------------------------------------------------------------------------------------------------------------------

## 2017-03-29 NOTE — Interval H&P Note (Signed)
UPDATES TO PATIENT'S CONDITION on the DAY OF SURGERY/PROCEDURE    I. Updates to Patient's Condition (to be completed by a provider privileged to complete a H&P, following reassessment of the patient by the provider):    Day of Surgery/Procedure Update:  History  History reviewed and no change    Physical  Physical exam updated and no change            II. Procedure Readiness   I have reviewed the patient's H&P and updated condition. By completing and signing this form, I attest that this patient is ready for surgery/procedure.    III. Attestation   I have reviewed the updated information regarding the patient's condition and it is appropriate to proceed with the planned surgery/procedure.    Delano Metz, MD as of 7:36 AM 03/29/2017

## 2017-03-29 NOTE — OR Nursing (Signed)
Pt and family greeted in prean and chart/eRecord reviewed. OR routine explained. Questions answered

## 2017-03-29 NOTE — Anesthesia Postprocedure Evaluation (Signed)
Anesthesia Post-Op Note    Patient: Amanda Galloway    Procedure(s) Performed:  Procedure Summary  Date:  03/29/2017 Anesthesia Start: 03/29/2017  7:34 AM Anesthesia Stop: 03/29/2017  5:33 PM Room / Location:  [id number] / [place] MAIN OR   Procedure(s):  WHIPPLE PROCEDURE Diagnosis:  Malignant neoplasm of pancreas, unspecified location of malignancy [C25.9] Surgeon(s):  Delano Metz, MD  [name], MD  Riki Altes, DO Attending Anesthesiologist:  Ardath Sax, MD         Recovery Vitals  BP: 91/76 (03/29/2017  8:30 PM)  Heart Rate: 97 (03/29/2017  8:30 PM)  Heart Rate (via Pulse Ox): 96 (03/29/2017  8:15 PM)  Resp: 13 (03/29/2017  8:30 PM)  Temp: 36 C (96.8 F) (03/29/2017  8:00 PM)  SpO2: 100 % (03/29/2017  8:30 PM)  O2 Flow Rate: 2 L/min (03/29/2017  8:15 PM)   0-10 Scale: 5 (03/29/2017  8:00 PM)  Anesthesia type:  General  Complications Noted During Procedure or in PACU:  None   Comment:    Patient Location:  PACU  Level of Consciousness:    Recovered to baseline  Patient Participation:     Able to participate  Temperature Status:    Normothermic  Oxygen Saturation:    Within patient's normal range  Cardiac Status:   Within patient's normal range  Fluid Status:    Stable  Airway Patency:     Yes  Pulmonary Status:    Baseline  Pain Management:    Adequate analgesia  Nausea and Vomiting:  None    Post Op Assessment:    Tolerated procedure well   Attending Attestation:  All indicated post anesthesia care provided     -

## 2017-03-29 NOTE — Progress Notes (Signed)
Critical Care Medicine Attending Note:      If there are key historical elements or objective findings that I think deserve particular emphasis, I have recorded them below. A summary of key elements of our assessment and plans is listed in the resident / medical student / PA / NP's note. Please refer to this note for complete details of our mutually agreed upon findings, assessment, and recommendations.  The problem list reflects my input.    Summary of history of present illness:  The patient is a 72 year old woman with a medical history significant for hypertension, pulmonary embolism (diagnosed in November 2018 (on Lovenox), fibromyalgia, gastroesophageal reflux disease, hypothyroidism, and anxiety/depression.  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  She had a cholecystectomy that failed to relieve her symptoms.  She has completed neoadjuvant chemotherapy and radiation therapy.  She has required methadone for chronic abdominal pain.  On 1/25 (yesterday), she underwent a Whipple procedure.  The surgical procedure and anesthetic were uncomplicated.  She was admitted to the SICU service for ongoing management and the expectation of complicated pain management.    Interval history:  No significant new problems noted overnight.  She notes ongoing pain.  She denies nausea.  She has not had flatus.    Objective Section:  BP: (87-122)/(56-76)   Temp:  [35.8 C (96.4 F)-37 C (98.6 F)]   Temp src: Temporal (01/26 1200)  Heart Rate:  [88-108]   Resp:  [10-22]   SpO2:  [92 %-100 %]       O2 Flow Rate: 2 L/min (03/30/17 0800)      Intake/Output last 3 shifts:  I/O last 3 completed shifts:  01/25 0700 - 01/26 0659  In: 9079.6 (126.1 mL/kg) [I.V.:7859.6 (4.5 mL/kg/hr); NG/GT:120; IV Piggyback:1100]  Out: 4420 (61.4 mL/kg) [Urine:3910 (2.3 mL/kg/hr); Emesis/NG output:100; Drains:110;  Blood:300]  Net: 4659.6  Weight: 72 kg     Intake/Output this shift:  I/O this shift:  01/26 0700 - 01/26 1459  In: 607 (8.4 mL/kg) [P.O.:3; I.V.:440; NG/GT:64; IV Piggyback:100]  Out: 270 (3.8 mL/kg) [Urine:195; Emesis/NG output:75]  Net: 337  Weight: 72 kg       Vent settings for last 24 hours:       Physical exam:      General: The patient is awake, alert, interactive, and appropriate.   She appears mildly uncomfortable.    Pulmonary: Symmetric breath sounds, essentially clear bilaterally.     Cardiac: Regular rate and rhythm.      Abdominal: Absent bowel sounds, soft, non-distended.  The dressing is intact with moderate serosanguinous drainage.      Extremities: Warm, pink, minimal edema.      Labs:  Lab Results   Component Value Date    WBC 14.3 (H) 03/30/2017    HCT 31 (L) 03/30/2017    PLT 323 03/30/2017     Lab Results   Component Value Date    NA 136 03/30/2017    K 5.0 03/30/2017    CL 96 03/30/2017    CO2 20 03/30/2017    UN 16 03/30/2017    CREAT 0.94 03/30/2017    WBGLU 138 (H) 03/30/2017    PGLU 139 (H) 03/30/2017     Lab Results   Component Value Date    CA 8.0 (L) 03/30/2017    MG 2.4 03/30/2017    PO4 5.7 (  H) 03/30/2017     Lab Results   Component Value Date    PTI 10.7 03/29/2017    INR 1.0 03/29/2017     Lab Results   Component Value Date    ALT 330 (H) 03/30/2017    AST 288 (H) 03/30/2017    ALK 104 03/30/2017     Bilirubin,Total   Date Value Ref Range Status   03/30/2017 <0.2 0.0 - 1.2 mg/dL Final           Lab results: 03/30/17  0843   Lactate ART,WB 3.7*              Currently Active/Followed Hospital Problems:  Active Hospital Problems    Diagnosis    *!*Pancreatic adenocarcinoma s/p Whipple 03/29/17     --S/p open Whipple on 1/25 with Dr. Ron Agee  --Duramorph spinal during case  --dPCA for post op pain  --Tylenol 1g q6h scheduled  --Toradol 23m q6h for 3 days scheduled  --Lyrica 1545mQHS  --Sips and ice chips  --LR 84cc/h while NPO  --ISS  --NG tube      Acute blood loss anemia     --On  admission to SICU H/H 10.2/34 (Basline Hct 30-40s)  --Will monitor      Hypothyroidism     --on synthroid  --will restart in the morning      Pancreatic cancer    Cancer associated pain     --Methadone at home--taking 1075mID   --dPCA for post op pain 0.3mg51m5mi75mn  --Tylenol 1g q6h scheduled  --Toradol 15mg 71mfor 3 days scheduled  --Lyrica 150mg Q67m         Attending summary impressions:     The patient is doing reasonably well following Whipple procedure for pancreatic cancer.   The patient has acceptable hemodynamics and is clinically perfusing well.    The patient is oxygenating and ventilating well on room air.  Will continue to encourage pulmonary toilet.  The patient has resolved respiratory insufficiency.    There is no evidence of enteric leak or mesenteric ischemia.   The patient has generally good to excellent urine output and normal renal function tests.   She has had intermittent oliguria that responds to IV fluid challenges.   The patient has acceptable anemia and a stable hematocrit.  No transfusion is needed.    She continues to have significant pain.  She is getting scheduled acetaminophen, ketorolac, and pregabalin.   We are increasing the frequency of the incremental dose to every 10 minutes and adding her home oral methadone dose.   The NG tube is being removed.   She is going to start clears.   Encouraging activity.    Attending Attestation    This is a high complexity patient.     _0  This patient is critically ill with at least 1 organ system failure including s/p Whipple procedure and anemia. The care I have delivered involved high complexity decision making to assess, manipulate, and support vital system function(s), to treat the vital organ system failure and/or prevent further deterioration of the patient's condition. All nursing documentation, laboratory data, test results, and radiographs were reviewed and interpreted by me. I have established the management plan for  this patient's critical illness and have been immediately available to assist with patient care. My documented total critical care time reflects my own patient care time and does not include teaching or procedure time.     Critical care time: Personal time  spent - (exclusive of performance of any procedures performed in the last 24 hours):  25 minutes    Signed by: Lorenza Chick, MD as of 03/30/2017 at 12:41 PM.

## 2017-03-29 NOTE — H&P (View-Only) (Signed)
HPB-GI Surgery Follow up note    Chief Complaint  Amanda Galloway is a 72 y.o. female who presents  on 03/11/2017 for HPB-GI follow-up care.     Diagnosis  Locally advanced Pancreatic adenocarinoma    Physician Team  Surgeon:   Delano Metz, MD  PCP:  Celesta Aver, MD  Gastroenterologist: Fonnie Jarvis, MD  Medical Oncology: Calton Dach, MD   Radiation Oncologist: Durene Romans, MD    Interval History  Amanda Galloway presents today with supportive daughter. Amanda Galloway is doing well and has complete radiation therapy this past Friday. Her appetite is good and weight is stable. Does report Amanda Galloway is fatigue but able to carry on with activities. Pain is well controlled now. Using methadone schedule and dilaudid PRN for pain. Having regular bowel movements with no evidence of steatorrhea or melena. Voiding without difficulty.     Oncologic History   Over several months, patient noted worsening abdominal pain and weight loss. Extensive work-up including EGD, colonoscopy and imaging were unrevealing. After suggestive HIDA scan, Amanda Galloway underwent cholecystectomy on September 06, 2016 but symptoms persisted.      MRCP on September 25, 2016 demonstrated2x2cm soft tissue mass-like abnormality in celiac region, contiguous with medial margin of uncinate process of pancreas concerning for pancreatic lession vs lymphadenopathy.     EUS/FNA on September 27, 2016 demonstrated a mass in pancreas uncinate process and lymph node pressing celiac axis. FNA confirmed adenocarcinoma.      Amanda Galloway was transferred to [facility] for additional management. Abd/pelvis CT on September 30, 2016 revealed ill-defined prominent retroperitoneal soft tissue with central low attenuation suggestive of necrosis which abuts SMA and is adjacent to pancreatic uncinate process similar to prior MRI. Differential includes pancreatic neoplasm versus lymphadenopathy. CT angio on September 29, 2016 showed no pulmonary embolism. Nonspecific 3 mm nodules in right upper and middle lobe, recommend attention on  follow-up imaging.      Diagnostic laparoscopy and port placement by Dr. Ron Agee on October 04, 2016 - no evidence of metastatic disease.     FOLFIRINOX chemotherapy - treatment start date 10/05/2016.  Completed 8 cycles.     Neoadjuvant SBRT 25 Gy in 5 fractions pancreatic cancer, with concurrent boost to 30 Gy in 5 fractions to tumor around SMA from 12/128/18-03/08/17      Medical History  Past Medical History:   Diagnosis Date    Cancer     Depression     Fibromyalgia     Hypothyroidism      Surgical History  Past Surgical History:   Procedure Laterality Date    CHOLECYSTECTOMY      CHOLECYSTECTOMY, LAPAROSCOPIC  09/06/2016    HYSTERECTOMY  08/1986    Fibroids    KNEE SURGERY Right     PR INSERT TUNNELED CV CATH WITH PORT Right 10/04/2016    Procedure: Right IJ MEDIPORT Insertion;  Surgeon: Delano Metz, MD;  Location: [facility] MAIN OR;  Service: Oncology General    PR LAP,DIAGNOSTIC ABDOMEN N/A 10/04/2016    Procedure: LAPAROSCOPY DIAGNOSTIC;  Surgeon: Delano Metz, MD;  Location: [facility] MAIN OR;  Service: Oncology General    ROTATOR CUFF REPAIR Right     TONSILLECTOMY AND ADENOIDECTOMY       Family History  Family History   Problem Relation Age of Onset    Breast cancer Mother         died 40    Cancer Father         NHL, died 12    Heart Disease Father  Diabetes Sister     Obesity Sister         4 siblings     Social History  Social History     Social History    Marital status: Divorced     Spouse name: N/A    Number of children: N/A    Years of education: N/A     Occupational History    Not on file.     Social History Main Topics    Smoking status: Never Smoker    Smokeless tobacco: Never Used    Alcohol use No    Drug use: No    Sexual activity: Not on file     Social History Narrative    No narrative on file       Medications  Outpatient Encounter Prescriptions as of 03/11/2017   Medication Sig Dispense Refill    amitriptyline (ELAVIL) 25 MG tablet Take 1 tablet (25 mg total) by mouth nightly 30  tablet 5    dexamethasone (DECADRON) 4 MG tablet Take 1 tablet (4 mg total) by mouth 2 times daily (Patient taking differently: Take 4 mg by mouth daily   ) 60 tablet 5    docusate sodium (COLACE) 100 MG capsule Take 2 capsules (200 mg total) by mouth 2 times daily 120 capsule 5    LORazepam (ATIVAN) 0.5 MG tablet Take 1 tablet (0.5 mg total) by mouth 2 times daily as needed for Anxiety   Max daily dose: 1 mg 60 tablet 0    methadone (DOLOPHINE) 5 MG tablet Take 20mg  in the morning, 20mg  in afternoon and 15mg  at night. 195 tablet 0    enoxaparin (LOVENOX) 100 mg/mL injection Inject 1 Syringe (100 mg total) into the skin daily 30 Syringe 5    potassium chloride SA (KLOR-CON M20) 20 mEq  tablet Take 20 mEq by mouth 2 times daily      fluorouracil (ADRUCIL) 50 MG/ML injection Administer 3,477 mg into the vein every 14 days   Start in infusion center.  Infuse over 46 hours at home. 3477 mg 2    pegfilgrastim (NEULASTA) 6 MG/0.6ML injection syringe Inject 0.6 mLs (6 mg total) into the skin once 0.6 mL 3    famotidine (PEPCID) 20 MG tablet Take 1 tablet (20 mg total) by mouth 2 times daily 60 tablet 3    escitalopram (LEXAPRO) 20 MG tablet Take 1 tablet (20 mg total) by mouth daily 30 tablet 3    metoclopramide (REGLAN) 5 MG tablet Take 1 tablet (5 mg total) by mouth 3 times daily (before meals) 90 tablet 5    pantoprazole (PROTONIX) 40 MG EC tablet Take 1 tablet (40 mg total) by mouth 2 times daily (before meals)   Swallow whole. Do not crush, break, or chew. 60 tablet 5    senna (SENOKOT) 8.6 MG tablet Take 2 tablets by mouth 2 times daily 120 tablet 5    Meds Exist in Therapy Plan Peg-Filgrastim      heparin 100 UNIT/ML injection 5 mLs (500 Units total) by Intracatheter route as needed (for line care) 30 Syringe 5    sodium chloride 0.9 % flush 10 mLs by Intracatheter route as needed (for line care) 600 mL 5    PREMARIN 0.625 MG tablet       ondansetron (ZOFRAN-ODT) 4 MG disintegrating tablet Take 1  tablet (4 mg total) by mouth 3 times daily as needed for Nausea   Place on top of tongue. 60 tablet 2  levothyroxine (SYNTHROID, LEVOTHROID) 137 MCG tablet Take 1 tablet (137 mcg total) by mouth daily (before breakfast) 30 tablet 0    Vitamins - senior (CENTRUM SILVER) TABS Take 1 tablet by mouth daily 30 tablet 0    triamterene-hydrochlorothiazide (MAXZIDE-25) 37.5-25 MG per tablet Take 1 tablet by mouth daily 30 tablet 0     No facility-administered encounter medications on file as of 03/11/2017.       Allergies  No Known Allergies (drug, envir, food or latex)  Review of Systems  Review of Systems   Constitutional: Positive for malaise/fatigue. Negative for chills, fever and weight loss.   HENT: Negative for hearing loss.    Eyes: Negative for blurred vision.   Respiratory: Negative for cough and shortness of breath.    Cardiovascular: Negative for chest pain, palpitations and leg swelling.   Gastrointestinal: Positive for nausea. Negative for abdominal pain, blood in stool, constipation, diarrhea, heartburn and vomiting.   Genitourinary: Negative for frequency.   Skin: Negative for rash.   Neurological: Negative for weakness.   Endo/Heme/Allergies: Does not bruise/bleed easily.   Psychiatric/Behavioral: Negative for memory loss.     Physical Exam  BP 132/62    Pulse 92    Temp 36.4 C (97.5 F)    Resp 14    Ht 170.2 cm (5\' 7" )    Wt 73 kg (160 lb 15 oz)    SpO2 100%    BMI 25.21 kg/m   Physical Exam   Constitutional: Amanda Galloway is oriented to person, place, and time. Amanda Galloway appears well-developed and well-nourished. No distress.   HENT:   Head: Normocephalic.   Mouth/Throat: No oropharyngeal exudate.   Eyes: Pupils are equal, round, and reactive to light. Conjunctivae and EOM are normal. Right eye exhibits no discharge. Left eye exhibits no discharge. No scleral icterus.   Neck: Normal range of motion.   Cardiovascular: Normal rate, regular rhythm, normal heart sounds and intact distal pulses.  Exam reveals no gallop  and no friction rub.    No murmur heard.  Pulmonary/Chest: Effort normal and breath sounds normal. No respiratory distress. Amanda Galloway has no wheezes. Amanda Galloway has no rales.   Abdominal: Soft. Bowel sounds are normal. Amanda Galloway exhibits no distension and no mass. There is no tenderness. There is no rebound and no guarding.   Musculoskeletal: Amanda Galloway exhibits no edema.   Lymphadenopathy:     Amanda Galloway has no cervical adenopathy.   Neurological: Amanda Galloway is alert and oriented to person, place, and time.   Skin: Skin is warm and dry. No rash noted. Amanda Galloway is not diaphoretic. No erythema. No pallor.   Psychiatric: Amanda Galloway has a normal mood and affect. Her behavior is normal. Judgment and thought content normal.     Laboratory Data      Lab results: 02/01/17  1150   WBC 10.9*       No components found with this basename:  HGB,  HCT,  PLT,  CEA,  CA19-9  Pathology  Incoming Consult Med Cyto       Collected: 10/18/2016 10:35 AM         FINAL DIAGNOSIS:   Outside material received from [facility], [place] for consultation.     Cytology Consultation:   Pancreas, head, mass, fine needle aspiration ([id number], 09/27/2016):     Atypical cells suspicious for adenocarcinoma. See comment.     Comment: Limited cells of interest are present for evaluation. Clinical correlation is recommended.     Imaging Data  Ct Abdomen And Pelvis With Contrast  Result Date: 01/18/2017  Stable treated Pancreatic carcinoma (uncinate process). Heterogeneous uncinate process without discrete measurable tumor. Resolution of original regional lymphadenopathy. Small amount of residual soft tissue extending to the SMA. No new metastases. Altogether stable compared to September 2018 and is improved (partial response) compared to July 2018.     Ct Chest With Contrast  Result Date: 01/18/2017  1.  Filling defect identified in a segmental/subsegmental right lower lobe pulmonary artery consistent with an embolus.   2.  Small scattered pulmonary nodules noted, measuring 2  mm in the right middle lobe and in the left lower lobe measuring 2 mm, not significantly changed when compared with prior imaging. Continued attention on follow-up imaging recommended.   3.  No clearly enlarged lymph nodes by CT size criteria. Findings discussed with Gena Fray via phone at 3:05 pm on 01/18/2017. Please see the separately dictated CT abdomen and pelvis imaging report for detailed findings below the level the diaphragm.       Assessment  Alaila Pillard is a 72 y.o. female with locally advanced pancreatic adenocarcinoma. Amanda Galloway completed 8 cycles of FOLFIRINOX on 01/11/17 with restaging scans showing stable disease / partial response. Amanda Galloway has also complete SBRT and tolerated well, here to discuss surgical planning.     We dicussed Amanda Galloway will undergo pancreatoduodenectomy (whipple surgery). We  Discussed the risks, benefits, and alternatives for surgery including bleeding, infection, blood clots, injury to adjacent structures, pancreatic leak (15%), delayed gastric emptying, pancreatic exocrine insufficiency, worsening glucose tolerance, need for additional treatment/procedures, recurrence and the risks of general anesthetic including MI, CVA, sudden death or even reaction to anesthetic medications. The patient understands the risks, any and all questions were answered to the patient's satisfaction.    Plan  1. Scheduled for surgery on 03/29/17.   2.Consent reviewed, preoperative teaching completed, patient verbalizes understanding of instructions, he does not object to blood transfusion, preoperative orders completed, labs to be repeated: T&S, prealbumin, PT/INR, CMP, CBC, patient to discontinue ASA/NSAIDS 7 days prior to surgery, Hold lovenox day prior to surgery.   3. Whipple ERAS protocol reviewed.    4. Will bring completed HCP day of surgery  5. Impact nutritional supplement prescribed    Rodrigo Ran, NP      I saw, examined and evaluated the patient, reviewed the patient chart and the relevant images  and lab findings. Rodrigo Ran, NP has acted as a Education administrator for this encounter at this office visit.      72 year old female with borderline resectable pancreatic cancer who underwent neoadjuvant chemotherapy. Amanda Galloway tolerated chemotherapy well and has a visible response in her tumor with no evidence of recurrence. Her tumor marker was initially well over 800 and has normalized at 37. Amanda Galloway has had continued abdominal pain which has been treated with methadone which Amanda Galloway still requires but has given good control.      I reviewed her imaging and discussed the recommendations to proceed with resection. We reviewed pancreaticoduodenectomy. The procedure, post operative course and possible complications. Hospital stay should be 5-7 days barring any major complications. Enrollment in an enhanced recovery after surgery program was also discussed with goals to improve outcome and recovery.     We have planned for surgery in two weeks to encourage further recovery from chemotherapy and made exercise and nutritional recommendations for this time. We will keep you posted.       I formulated the assessment plan and directed the care  of this patient. My detailed recommendations and opinion are documented in the above edited note which reflect the management plans. The patient is agreeable with this plan.    Delano Metz, MD, FACS  Associate Professor of Surgery  Division of Hepatobiliary, Pancreas, & GI Surgery

## 2017-03-29 NOTE — Anesthesia Case Conclusion (Signed)
CASE CONCLUSION  Emergence  Actions:  Suctioned, soft bite block and extubated  Criteria Used for Airway Removal:  Adequate Tv & RR, acceptable O2 saturation and following commands  Assessment:  Routine  Transport  Directly to: PACU  Airway:  Facemask  Oxygen Delivery:  10 lpm  Position:  Recumbent  Patient Condition on Handoff  Level of Consciousness:  Mildly sedated  Patient Condition:  Stable  Handoff Report to:  RN

## 2017-03-29 NOTE — Op Note (Addendum)
Operative Note (Surgical Case/Log ID: [id number])       Date of Surgery: 03/29/2017       Surgeons: Juliann Mule) and Role:     * Delano Metz, MD - Primary     * [name], MD - Resident - Assisting     * Riki Altes, DO - Resident - Assisting       Pre-op Diagnosis: Pre-Op Diagnosis Codes:     * Malignant neoplasm of pancreas, unspecified location of malignancy [C25.9]       Post-op Diagnosis: Post-Op Diagnosis Codes:     * Malignant neoplasm of pancreas, unspecified location of malignancy [C25.9]       Procedure(s) Performed: Procedure:    WHIPPLE PROCEDURE  CPT(R) Code:  65784 - PR PART REMV PANC,PROX+REMV DUOD+ANAST         Additional CPT Codes: 69629: Pr Removal, Abdomen Lymph Node,Staging;         Anesthesia Type: General        Fluid Totals: I/O this shift:  01/25 0600 - 01/25 2259  In: 7000 (97.2 mL/kg) [I.V.:7000]  Out: 3300 (45.8 mL/kg) [Urine:3000; Blood:300]  Net: 3700  Weight: 72 kg        Estimated Blood Loss: 300 mL       Specimens to Pathology:  ID Type Source Tests Collected by Time Destination   1 : bile EXUDATE Bile ANAEROBIC CULTURE, AEROBIC CULTURE, AFB CULTURE Karleen Hampshire, RN 03/29/2017 1205    A : omentum and whipple specimen TISSUE Tissue SURGICAL PATHOLOGY Michaelene Song, RN 03/29/2017 1243    B : perineural tissue surrounding SMA TISSUE Tissue SURGICAL PATHOLOGY Michaelene Song, RN 03/29/2017 1119    C : true uncinate margin TISSUE Tissue SURGICAL PATHOLOGY Michaelene Song, RN 03/29/2017 1246    D : Meckel's diverticulum TISSUE Tissue SURGICAL PATHOLOGY Michaelene Song, RN 03/29/2017 1308           Temporary Implants:        Packing:                 Patient Condition: good       Indications: Pancreatic cancer       Findings (Including unexpected complications): No gross evidence of metastatic disease. Firm tissue along SMA margin frozen analysis showing inflammatory cells without malignancy.      Indication: 72 year old female with pancreatic cancer and superior  mesenteric artery involvement. She complained of significant back pain and her CA 19-9 was elevated. She underwent neoadjuvant chemotherapy with plan for surgery. Her imaging showed improvement and CA 19-9 normalized. She was offered pancreaticoduodenectomy and wished to proceed.     Procedure:   The patient was identified in the preoperative area by Dr. Delano Metz. The procedure was once again reviewed and informed consent was assured for pancreaticoduodenectomy. She was brought to the operating room, sedated and intubated without difficulty. A sterile foley catheter was placed by the nursing staff. Anesthesia placed several large bore IVs and an arterial line. Her abdomen was then prepped and draped in a sterile fashion.  An official Time Out was performed which revealed the patient's name, date of birth, procedure, indication, allergies, and need for antibiotics and subcutaneous heparin. All staff were indicated by name. She received Invanz for surgical prophylaxis less than 30 minutes prior to surgical incision. Typed & crossed blood products were in the room.     An upper midline incision was created and electrocautery was used to dissect to  the fascia. The fascia was incised and safe entry into the abdominal cavity was assured. A  retractor was then placed and the abdominal cavity evaluated. No metastatic foci were detected along the liver, visceral or parietal peritoneum. The gastrocolic ligament was then incised and entry into the lesser sac was assured. The LigaSure device was used to continue take down the gastrocolic ligament and then this continued to the patient's right taking down the hepatic flexure of the colon. The middle colic was followed to the SMV and to the base of the pancreas and lateral dissection of the inferior aspect of the pancreas was performed. There was some excessive fibrosis due to the radiation therapy.  The gastrocolic vein was isolated and ligated using 3-0 silk suture.  Dissection continued to perform a Kocher maneuver. The anterior IVC was skeletonized and all lymphatic tissue anterior to the IVC was swept superiorly with the specimen from the right gonadal vein and left renal vein to the porta hepatis. This was again a bit more difficult due to radiation effect and the location of the tumor in the uncinate process. The tumor was fixed to the retroperitoneum at the level of the left renal vein insertion to the IVC. We did not free this area at this time due to further need to assess for resectability.     The gallbladder was previously removed and we proceeded to evaluate the porta. The hepatic artery lymph node was not easily visualized and we did not remove it separately at this time as is my usual practice.  The hepatic artery was carefully followed and the gastroduodenal artery and right gastric artery dissected free. The right gastric artery was ligated.  The gastroduodenal artery was circumferentially dissected free and isolated with a vessel loop. Blood flow was temporarily occluded along the GDA and arterial blood flow verified within the hepatic artery. Once adequate blood flow was appreciated, the GDA was then suture ligated. The bile duct was then dissected circumferentially and lifted off of the portal vein inferiorly. During this dissection, the porta hepatis lymph node was taken separately using the LigaSure device. The bile duct was transected proximal to the cystic duct. A bull-dog clamp was used to prevent continued spillage from the bile duct. Further dissection along the superior edge of the pancreas was performed and a retropancreatic window created. Through and through 3-0 Prolene sutures were placed on either side of the planned pancreatic transection site in a superior and inferior position.     We planned for classic pancreatectomy and prepared the stomach for transection.  The mesentery was ligated using the LigaSure device.  The stomach was transected  with two firings using the GIA stapler with a green load. An area of jejunum 20 cm distal to the ligament of Treitz was then prepared for transection and transected with the GIA stapler blue load. The mesentery was ligated close to the bowel using the Ligasure device. The bowel was transferred to the right in a retro-mesenteric manner. We then prepared the pancreas for transection. Through and through sutures were placed along superior and inferior edge of the pancreas on either side of the area for transection using 3-0 Prolene.The pancreas was then transected using Bovie electrocautery between these sutures. We now worked from the posterior aspect of the pancreas to separate the left renal vein from the posterior aspect of the pancreas. This was the primary focus of the tumor. The uncinate process was then carefully dissected from the portal vein using electrocautery. The  inferior pancreaticoduodenal artery and venous tributaries were ligated using 2-0 silk sutures.  The SMA was visualized and skeletonized which was considerably difficult due to the location of the tumor. We worked anteriorly and posteriorly to separate the uncinate from the origin of the SMA which was the primary location of the tumor. and easily separated from the specimen. The specimen was removed and marked off table prior to sending to Pathology.  This marked the end of the resection phase of the procedure at which time a full retroperitoneal lymph node dissection was performed in addition to resection of the pancreaticoduodenectomy.     The distal jejunum was then brought through the retroperitoneal mesenteric defect (in situ). A double layer pancreaticojejunostomy was performed in duct to mucosa technique with a 4-0 Prolene running posterior layer and interrupted 5-0 Maxon for the inner layer. We then performed the anterior second layer with 4-0 Prolene again in a running layer. An appropriate position 15-cm distal to this anastomosis a  single-layer hepaticojejunostomy was performed in an interrupted fashion using 4-0 PDS. Due to the configuration of the biliary limb, it was felt that a roux-en-Y configuration would be most appropriate. This was based on the position of the mesentery lie. We then transected the bowel at 30-cm and brought the distal area to the stomach for gastrojejunostomy. We created an antecolic two layer gastrojejunostomy  using 3-0 silk and Vicryl. The inner layer was accomplished in a running, locking [place] fashion with 3-0 Vicryl suture. We then created a jejunojunostomy using a single firing of a GIA stapler with blue load. The bowel was aligned, enterotomies created and a single firing of the stapler was performed. We then closed the defect with a single firing of a TA stapler blue load. The staple line was oversewed with 3-0 silk in Lembert fashion.     The abdomen was then irrigated until the effluent clear. Bleeding points coagulated with electrocautery. A 15 [place] Blake drain was placed behind the gastrojejunostomy and anterior to the pancreaticojejunostomy.     An abdominal sweep was performed and no sponges were detected. All sponge and instrument counts were correct at the end of the case. The fascia was then closed using #1 PDS in a running fashion and skin closed using 4-0 Monocryl in a subcuticular fashion A sterile dressing was placed.     The patient was awoken from anesthesia and extubated. She was taken to the post anesthesia care unit in stable condition. Dr. Delano Metz was present and participated in the entire case.     Time: 8 hours   Blood transfusion: 0 pRBC  Drains: 15 French  Pancreas Texture: soft (soft, firm)  Pancreatic duct size: small, 1-mm (small, large)    Signed: Delano Metz, MD 03/29/2017 5:56 PM

## 2017-03-30 DIAGNOSIS — D649 Anemia, unspecified: Secondary | ICD-10-CM | POA: Insufficient documentation

## 2017-03-30 LAB — ART GASES / WHOLE BLOOD PANEL
Base Excess, Arterial: 0 mmol/L (ref <=2)
CO2,ART (Calc): 27 mmol/L (ref 21–28)
CO: 0.9 %
FO2 Hb, Arterial: 93 % (ref 90–95)
Glucose,WB: 138 mg/dL — ABNORMAL HIGH (ref 60–99)
HCO3, Arterial: 25 mmol/L — ABNORMAL HIGH (ref 19–23)
Hemoglobin: 10 g/dL — ABNORMAL LOW (ref 11.2–15.7)
ICA @7.4,WB: 4.1 mg/dL — ABNORMAL LOW (ref 4.8–5.2)
ICA Uncorr,WB: 4.1 mg/dL
Lactate ART,WB: 3.7 mmol/L (ref 0.3–0.8)
Methemoglobin: 0.2 % (ref 0.0–1.0)
NA, WB: 131 mmol/L — ABNORMAL LOW (ref 135–145)
Potassium,WB: 4.8 mmol/L — ABNORMAL HIGH (ref 3.4–4.7)
pCO2, Arterial: 43 mm Hg (ref 33–43)
pH: 7.39 (ref 7.36–7.44)
pO2,Arterial: 76 mm Hg — ABNORMAL LOW (ref 80–100)

## 2017-03-30 LAB — AST: AST: 288 U/L — ABNORMAL HIGH (ref 0–35)

## 2017-03-30 LAB — MAGNESIUM: Magnesium: 2.4 mg/dL (ref 1.6–2.5)

## 2017-03-30 LAB — POCT GLUCOSE
Glucose POCT: 105 mg/dL — ABNORMAL HIGH (ref 60–99)
Glucose POCT: 139 mg/dL — ABNORMAL HIGH (ref 60–99)
Glucose POCT: 140 mg/dL — ABNORMAL HIGH (ref 60–99)
Glucose POCT: 141 mg/dL — ABNORMAL HIGH (ref 60–99)
Glucose POCT: 146 mg/dL — ABNORMAL HIGH (ref 60–99)
Glucose POCT: 151 mg/dL — ABNORMAL HIGH (ref 60–99)

## 2017-03-30 LAB — POTASSIUM: Potassium: 5 mmol/L (ref 3.3–5.1)

## 2017-03-30 LAB — COMPREHENSIVE METABOLIC PANEL
ALT: 330 U/L — ABNORMAL HIGH (ref 0–35)
Albumin: 2.8 g/dL — ABNORMAL LOW (ref 3.5–5.2)
Alk Phos: 104 U/L (ref 35–105)
Anion Gap: 20 — ABNORMAL HIGH (ref 7–16)
Bilirubin,Total: <0.2 mg/dL (ref 0.0–1.2)
CO2: 20 mmol/L (ref 20–28)
Calcium: 8 mg/dL — ABNORMAL LOW (ref 8.6–10.2)
Chloride: 96 mmol/L (ref 96–108)
Creatinine: 0.94 mg/dL (ref 0.51–0.95)
GFR,Black: 71 *
GFR,Caucasian: 61 *
Glucose: 165 mg/dL — ABNORMAL HIGH (ref 60–99)
Lab: 16 mg/dL (ref 6–20)
Sodium: 136 mmol/L (ref 133–145)
Total Protein: 5.1 g/dL — ABNORMAL LOW (ref 6.3–7.7)

## 2017-03-30 LAB — PHOSPHORUS: Phosphorus: 5.7 mg/dL — ABNORMAL HIGH (ref 2.7–4.5)

## 2017-03-30 LAB — CBC
Hematocrit: 31 % — ABNORMAL LOW (ref 34–45)
Hemoglobin: 10.1 g/dL — ABNORMAL LOW (ref 11.2–15.7)
MCH: 32 pg/cell (ref 26–32)
MCHC: 33 g/dL (ref 32–36)
MCV: 98 fL — ABNORMAL HIGH (ref 79–95)
Platelets: 323 10*3/uL (ref 160–370)
RBC: 3.2 MIL/uL — ABNORMAL LOW (ref 3.9–5.2)
RDW: 16 % — ABNORMAL HIGH (ref 11.7–14.4)
WBC: 14.3 10*3/uL — ABNORMAL HIGH (ref 4.0–10.0)

## 2017-03-30 LAB — AFB STAIN: AFB Stain: 0

## 2017-03-30 LAB — HOLD GREEN NO GEL

## 2017-03-30 MED ORDER — ENOXAPARIN SODIUM 40 MG/0.4ML IJ SOSY *I*
40.0000 mg | PREFILLED_SYRINGE | Freq: Every day | INTRAMUSCULAR | Status: DC
Start: 2017-03-30 — End: 2017-03-31
  Administered 2017-03-30: 40 mg via SUBCUTANEOUS
  Filled 2017-03-30: qty 0.4

## 2017-03-30 MED ORDER — HYDROMORPHONE HCL 2 MG/ML IJ SOLN *WRAPPED*
1.0000 mg | Freq: Once | INTRAMUSCULAR | Status: AC
Start: 2017-03-30 — End: 2017-03-30
  Administered 2017-03-30: 1 mg via INTRAVENOUS
  Filled 2017-03-30: qty 1

## 2017-03-30 MED ORDER — METHADONE HCL 5 MG PO TABS *I*
15.0000 mg | ORAL_TABLET | Freq: Every day | ORAL | Status: DC
Start: 2017-03-30 — End: 2017-04-08
  Administered 2017-03-30 – 2017-04-08 (×10): 15 mg via ORAL
  Filled 2017-03-30 (×2): qty 1
  Filled 2017-03-30: qty 3
  Filled 2017-03-30: qty 1
  Filled 2017-03-30: qty 3
  Filled 2017-03-30 (×9): qty 1
  Filled 2017-03-30: qty 3
  Filled 2017-03-30 (×2): qty 1

## 2017-03-30 MED ORDER — PLASMA-LYTE IV BOLUS *WRAPPED*
1000.0000 mL | Freq: Once | INTRAVENOUS | Status: AC
Start: 2017-03-30 — End: 2017-03-30
  Administered 2017-03-30: 1000 mL via INTRAVENOUS

## 2017-03-30 MED ORDER — METHADONE HCL 10 MG PO TABS *I*
10.0000 mg | ORAL_TABLET | Freq: Every evening | ORAL | Status: DC
Start: 2017-03-30 — End: 2017-04-08
  Administered 2017-03-30 – 2017-04-07 (×9): 10 mg via ORAL
  Filled 2017-03-30: qty 1
  Filled 2017-03-30: qty 2
  Filled 2017-03-30 (×4): qty 1
  Filled 2017-03-30: qty 2
  Filled 2017-03-30 (×2): qty 1

## 2017-03-30 MED ORDER — PANTOPRAZOLE SODIUM 40 MG IV SOLR *I*
40.0000 mg | Freq: Every morning | INTRAVENOUS | Status: DC
Start: 2017-03-31 — End: 2017-04-01
  Administered 2017-03-31 – 2017-04-01 (×2): 40 mg via INTRAVENOUS
  Filled 2017-03-30 (×2): qty 10

## 2017-03-30 MED ORDER — METHADONE HCL 10 MG PO TABS *I*
10.0000 mg | ORAL_TABLET | Freq: Every morning | ORAL | Status: DC
Start: 2017-03-31 — End: 2017-04-08
  Administered 2017-03-31 – 2017-04-08 (×8): 10 mg via ORAL
  Filled 2017-03-30: qty 2
  Filled 2017-03-30 (×2): qty 1
  Filled 2017-03-30: qty 2
  Filled 2017-03-30 (×5): qty 1

## 2017-03-30 MED ORDER — NALOXONE HCL 0.4 MG/ML IJ SOLN *WRAPPED*
0.1000 mg | Status: AC | PRN
Start: 2017-03-30 — End: 2017-03-31

## 2017-03-30 MED ORDER — PLASMA-LYTE IV SOLN *WRAPPED*
500.0000 mL/h | Freq: Once | Status: AC
Start: 2017-03-30 — End: 2017-03-30
  Administered 2017-03-30: 500 mL/h via INTRAVENOUS

## 2017-03-30 MED ORDER — METHADONE HCL 5 MG PO TABS *I*
10.0000 mg | ORAL_TABLET | Freq: Two times a day (BID) | ORAL | Status: DC
Start: 2017-03-30 — End: 2017-03-30

## 2017-03-30 NOTE — Progress Notes (Signed)
Report Given To  Amber, RN       Descriptive Sentence / Reason for Admission   Amanda Galloway is a 72 y.o. female with PMHx significant for pancreatic adenocarcinoma now s/p open Whipple on 03/29/17.  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      Active Issues / Relevant Events   Assumed care 0700-1900. VSS, afebrile, A&O x 3. One time Dilaudid given for breakthrough pain with short term positive effects. PCA dose changed, see MAR. Low UOP x 3 throughout shift. Sheran Luz, NP notified each time and one liter plasmalyte bolus, total ordered and given with positive effects. NGT removed per order. Pt started on clear liquid diet, tolerating. For VS and assessments see [email or web address]       To Do List  BG Q4        Anticipatory Guidance / Discharge Planning  ---Critical Care Bundle---    Fluids & Electrolytes: LR@84   Nutrition: clears   Mobility Plan/Ability: OOB 2 max asssit   Respiratory weaning/pulmonary toilet: room air / 2L NC  Neuro/sedation/pain: RTN Lyrica, IV Tylenol, IV Toradol, methadone  PCA dilaudid.  Delirium: No  Sleep Hygiene: Yes / No -need ordered   List of antimicrobials:   Drains/Lines/Tubes: JP, NGT, PIVx2, Aline  Foley: Yes  DVT prophylaxis/aPTT: IPC, SQ Lovenox   Indication for gastric acid suppression: IV Protonix  Date of last BM: : Prior to admission   Type and frequency of Labs: Daily: CMP, Mag, Phos  Family Concerns and Updates: Daughter and Son at bedside 1/25  Patient prefers to be called (AKA):   Code Status: Full  Disposition: PCU    Brien Few, RN

## 2017-03-30 NOTE — Plan of Care (Signed)
Mobility    • Functional status is maintained or improved - Geriatric Progressing towards goal        Pain/Comfort    • Patient's pain or discomfort is manageable Progressing towards goal        Safety    • Patient will remain free of falls Progressing towards goal

## 2017-03-30 NOTE — Interdisciplinary Rounds (Addendum)
Amanda Galloway is a 72 y.o. female with PMHx significant for pancreatic adenocarcinoma now s/p open Whipple on 03/29/17, also with depression, Fibromyalgia, and hypothyroidism. She had an open whipple done today, which was uncomplicated. 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      Foley:yes  Central Line:NA  DVT prophylaxis:lovenox SQ   Indication for gastric acid suppression:protonix  Sedation:NA  Mobility:out of bed to chair (PT ordered)  Flu Vaccine screen:         03/30/2017 daily interdisciplinary goals of care:  D/C NG tube   Start clear liquid diet  Start methadone (home medication)  Order PT

## 2017-03-30 NOTE — Progress Notes (Signed)
Report Given To  Sena Slate RN      Descriptive Sentence / Reason for Admission   Amanda Galloway is a 72 y.o. female with PMHx significant for pancreatic adenocarcinoma now s/p open Whipple on 03/29/17.  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      Active Issues / Relevant Events   Patient transferred to [id number] from the PACU.  VSSA, A line dampened.  Patient alert and orientated times three. Patient complaining of pain, patient states she gets some relief with PCA.  SICU notified, X1 Dilaudid given with good effect.  Patient denies nausea.  PCA dose changed per order for pain relief.  UOP 50cc over two hours, SICU notified.  1L Plasmalyte bolus given.  Urine increased after bolus.  Patient awoken in 9/10 pain.  SICU notified, X1 Dilaudid given with good effect.  Patient did not sleep well.          To Do List  BG Q4        Anticipatory Guidance / Discharge Planning  ---Critical Care Bundle---    Fluids & Electrolytes: LR@84   Nutrition: Sips or ice chips for comfort   Mobility Plan/Ability: Out of bed  Respiratory weaning/pulmonary toilet: 2LNC  Neuro/sedation/pain: RTN Lyrica, IV Tylenol, IV Toradol.  PCA dilaudid.  Delirium: No  Sleep Hygiene: Yes / No -need ordered   List of antimicrobials:   Drains/Lines/Tubes: JP, NGT, PIVx2, Aline  Foley: Yes  DVT prophylaxis/aPTT: IPC, SQ Heparin   Indication for gastric acid suppression: IV Protonix  Date of last BM: : Prior to admission   Type and frequency of Labs: Daily: CMP, Mag, Phos  Family Concerns and Updates: Daughter and Son at bedside 1/25  Patient prefers to be called (AKA):   Code Status: Full  Disposition: PCU  Scheryl Darter, RN

## 2017-03-30 NOTE — Progress Notes (Signed)
SICU Progress Note   Amanda Galloway is a 72 y.o. female with PMHx significant for pancreatic adenocarcinoma now s/p open Whipple on 03/29/17, also with depression, Fibromyalgia, and hypothyroidism. She had an open whipple done today, which was uncomplicated. 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    Interval History: 1 liter bolus over night hypotension, continues to have complaints of pain on inspiration.      Vital Signs:  BP (!) 87/65 (BP Location: Right arm)    Pulse 98    Temp 35.8 C (96.4 F) (Temporal)    Resp 11    Ht 1.702 m (5\' 7" )    Wt 72 kg (158 lb 11.7 oz)    SpO2 98%    BMI 24.86 kg/m     Physical Exam by Systems:  Constitutional: awake alert.  Eyes: sclera clear  Ears, Nose, Mouth, and Throat: ngt  Cardiovascular: NSR, no m/r/g  Respiratory: anterior clear, shallow breaths,   Gastrointestinal: midline with dressing intact, right JP with s/s drainage  Genitourinary: foley  Musculoskeletal: mae  Skin: warm  Neurologic: intact  Psychiatric: anxious    Assessment and Plan for Active/Followed Hospital Problems:   Active Hospital Problems    Diagnosis    *!*Pancreatic adenocarcinoma s/p Whipple 03/29/17     --S/p open Whipple on 1/25 with Dr. Ron Agee  --Duramorph spinal during case  --dPCA for post op pain  --Tylenol 1g q6h scheduled  --Toradol 15mg  q6h for 3 days scheduled  --Lyrica 150mg  QHS  --Sips and ice chips  --LR 84cc/h while NPO  --ISS  --NG tube      Anemia     - hct 31  - monitor  - JP s/s      Acute blood loss anemia     --On admission to SICU H/H 10.2/34 (Basline Hct 30-40s)  --Will monitor      Hypothyroidism     --on synthroid  --will restart in the morning      Pancreatic cancer    Cancer associated pain     --Methadone at home--taking 10mg  BID   --dPCA for post op pain 0.3mg  q75min prn  --Tylenol 1g q6h scheduled  --Toradol 15mg  q6h for 3 days  scheduled  --Lyrica 150mg  QHS          ---SICU Critical Care  Bundle---  Fluids & Electrolytes: LR@84   Nutrition: clears   Mobility Plan/Ability: OOB 2 max asssit   Respiratory weaning/pulmonary toilet: room air   Neuro/sedation/pain: RTN Lyrica, IV Tylenol, IV Toradol.  PCA dilaudid.  Delirium: No  Sleep Hygiene: Yes / No -need ordered   List of antimicrobials:   Drains/Lines/Tubes: JP, NGT, PIVx2, Aline  Foley: Yes  DVT prophylaxis/aPTT: IPC, SQ Heparin   Indication for gastric acid suppression: IV Protonix  Date of last BM: : Prior to admission   Type and frequency of Labs: Daily: CMP, Mag, Phos  Family Concerns and Updates: Daughter and Son at bedside 1/25  Patient prefers to be called (AKA):   Code Status: Full  Disposition: PCU    Author: Beverely Low, NP  as of: 03/30/2017  at: 8:06 AM

## 2017-03-30 NOTE — Progress Notes (Addendum)
Surgical Oncology/Hepatobiliary Surgery Progress Note     LOS: 1 day     Subjective:    1x bolus for low UOP.  Describes pain is not well controlled - hurts everywhere and difficult to take deep breaths.  NGT minimal output.  States she passed this morning.    Objective:    Vitals Sign Ranges for Past 24 Hours:  BP: (87-122)/(56-76)   Temp:  [35.8 C (96.4 F)-36.3 C (97.3 F)]   Temp src: Temporal (01/26 0400)  Heart Rate:  [88-108]   Resp:  [10-22]   SpO2:  [97 %-100 %]       Physical Exam:    General Appearance: alert, no distress  HEENT: NGT in place  Cardiac: RRR  Respiratory: non-labored  Abdomen: soft, TTP, midline dressing with partial sang strikethrough, JP with some serosang leakage around JP site, JP with small amount of dark  serosang output  Genitourinary: foley in place  Extremities: warm, well-perfused    Labs:    CBC:    Recent Labs  Lab 03/30/17  0016 03/29/17  1758   WBC 14.3* 15.3*   Hemoglobin 10.1* 10.4*   11.4   Hematocrit 31* 32*   Platelets 323 932       Metabolic Panel:    Recent Labs  Lab 03/30/17  0646 03/30/17  0410 03/30/17  0016 03/29/17  1758   Sodium  --   --  136 134   Potassium CANCELED CANCELED CANCELED 4.6   Chloride  --   --  96 94*   CO2  --   --  20 21   UN  --   --  16 14   Creatinine  --   --  0.94 0.73   Glucose  --   --  165* 209*   Calcium  --   --  8.0* 7.8*   Magnesium  --   --  2.4 2.5   Phosphorus  --   --  5.7* 4.9*          Assessment:    73 y.o. female with pancreatic cancer POD # 1  status post whipple procedure.      Plan:    -- Needs pain regimen adjusted/changed; consider APS consult given complex pain history  -- Fluid resuscitation as needed  -- Continue foley for now given low UOP overnight requiring bolus  -- NGT to LIWS currently - can remove  -- Can start clears  -- Appreciate excellent SICU care      Donata Duff, MD  03/30/2017     7:52 AM  General Surgery Resident, PGY-3

## 2017-03-31 ENCOUNTER — Encounter: Payer: Self-pay | Admitting: Nurse Practitioner

## 2017-03-31 DIAGNOSIS — I2699 Other pulmonary embolism without acute cor pulmonale: Secondary | ICD-10-CM | POA: Diagnosis present

## 2017-03-31 DIAGNOSIS — N179 Acute kidney failure, unspecified: Secondary | ICD-10-CM | POA: Insufficient documentation

## 2017-03-31 LAB — BASIC METABOLIC PANEL
Anion Gap: 9 (ref 7–16)
CO2: 29 mmol/L — ABNORMAL HIGH (ref 20–28)
Calcium: 7.8 mg/dL — ABNORMAL LOW (ref 8.6–10.2)
Chloride: 99 mmol/L (ref 96–108)
Creatinine: 0.89 mg/dL (ref 0.51–0.95)
GFR,Black: 76 *
GFR,Caucasian: 66 *
Glucose: 128 mg/dL — ABNORMAL HIGH (ref 60–99)
Lab: 18 mg/dL (ref 6–20)
Potassium: 4.3 mmol/L (ref 3.3–5.1)
Sodium: 137 mmol/L (ref 133–145)

## 2017-03-31 LAB — CBC AND DIFFERENTIAL
Baso # K/uL: 0 10*3/uL (ref 0.0–0.1)
Basophil %: 0 %
Eos # K/uL: 0 10*3/uL (ref 0.0–0.4)
Eosinophil %: 0 %
Hematocrit: 22 % — ABNORMAL LOW (ref 34–45)
Hemoglobin: 6.7 g/dL — ABNORMAL LOW (ref 11.2–15.7)
Lymph # K/uL: 0.3 10*3/uL — ABNORMAL LOW (ref 1.2–3.7)
Lymphocyte %: 2.6 %
MCH: 31 pg/cell (ref 26–32)
MCHC: 30 g/dL — ABNORMAL LOW (ref 32–36)
MCV: 104 fL — ABNORMAL HIGH (ref 79–95)
Mono # K/uL: 0 10*3/uL — ABNORMAL LOW (ref 0.2–0.9)
Monocyte %: 0 %
Neut # K/uL: 9 10*3/uL — ABNORMAL HIGH (ref 1.6–6.1)
Nucl RBC # K/uL: 0 10*3/uL (ref 0.0–0.0)
Nucl RBC %: 0 /100 WBC (ref 0.0–0.2)
Platelets: 248 10*3/uL (ref 160–370)
RBC: 2.2 MIL/uL — ABNORMAL LOW (ref 3.9–5.2)
RDW: 16.6 % — ABNORMAL HIGH (ref 11.7–14.4)
Seg Neut %: 96.5 %
WBC: 9.3 10*3/uL (ref 4.0–10.0)

## 2017-03-31 LAB — COMPREHENSIVE METABOLIC PANEL
ALT: 183 U/L — ABNORMAL HIGH (ref 0–35)
AST: 198 U/L — ABNORMAL HIGH (ref 0–35)
Albumin: 2.4 g/dL — ABNORMAL LOW (ref 3.5–5.2)
Alk Phos: 84 U/L (ref 35–105)
Anion Gap: 9 (ref 7–16)
Bilirubin,Total: <0.2 mg/dL (ref 0.0–1.2)
CO2: 29 mmol/L — ABNORMAL HIGH (ref 20–28)
Calcium: 7.7 mg/dL — ABNORMAL LOW (ref 8.6–10.2)
Chloride: 99 mmol/L (ref 96–108)
Creatinine: 1.1 mg/dL — ABNORMAL HIGH (ref 0.51–0.95)
GFR,Black: 58 * — AB
GFR,Caucasian: 51 * — AB
Glucose: 139 mg/dL — ABNORMAL HIGH (ref 60–99)
Lab: 21 mg/dL — ABNORMAL HIGH (ref 6–20)
Potassium: 4.7 mmol/L (ref 3.3–5.1)
Sodium: 137 mmol/L (ref 133–145)
Total Protein: 4.5 g/dL — ABNORMAL LOW (ref 6.3–7.7)

## 2017-03-31 LAB — CBC
Hematocrit: 24 % — ABNORMAL LOW (ref 34–45)
Hemoglobin: 7.5 g/dL — ABNORMAL LOW (ref 11.2–15.7)
MCH: 32 pg/cell (ref 26–32)
MCHC: 32 g/dL (ref 32–36)
MCV: 101 fL — ABNORMAL HIGH (ref 79–95)
Platelets: 254 10*3/uL (ref 160–370)
RBC: 2.3 MIL/uL — ABNORMAL LOW (ref 3.9–5.2)
RDW: 16.6 % — ABNORMAL HIGH (ref 11.7–14.4)
WBC: 10 10*3/uL (ref 4.0–10.0)

## 2017-03-31 LAB — POCT GLUCOSE
Glucose POCT: 109 mg/dL — ABNORMAL HIGH (ref 60–99)
Glucose POCT: 111 mg/dL — ABNORMAL HIGH (ref 60–99)
Glucose POCT: 120 mg/dL — ABNORMAL HIGH (ref 60–99)
Glucose POCT: 120 mg/dL — ABNORMAL HIGH (ref 60–99)
Glucose POCT: 122 mg/dL — ABNORMAL HIGH (ref 60–99)
Glucose POCT: 130 mg/dL — ABNORMAL HIGH (ref 60–99)
Glucose POCT: 144 mg/dL — ABNORMAL HIGH (ref 60–99)

## 2017-03-31 LAB — MAGNESIUM: Magnesium: 2.6 mg/dL — ABNORMAL HIGH (ref 1.6–2.5)

## 2017-03-31 LAB — DIFF MANUAL
Diff Based On: 114 CELLS
Metamyelocyte %: 1 % (ref 0–1)

## 2017-03-31 LAB — HCT AND HGB
Hematocrit: 23 % — ABNORMAL LOW (ref 34–45)
Hemoglobin: 6.9 g/dL — ABNORMAL LOW (ref 11.2–15.7)

## 2017-03-31 LAB — MCHC: MCHC: 30 g/dL — ABNORMAL LOW (ref 32–36)

## 2017-03-31 LAB — PHOSPHORUS: Phosphorus: 4.6 mg/dL — ABNORMAL HIGH (ref 2.7–4.5)

## 2017-03-31 LAB — AEROBIC CULTURE: Aerobic Culture: 0

## 2017-03-31 MED ORDER — PLASMA-LYTE IV BOLUS *WRAPPED*
1000.0000 mL | Freq: Once | INTRAVENOUS | Status: AC
Start: 2017-03-31 — End: 2017-03-31
  Administered 2017-03-31: 1000 mL via INTRAVENOUS

## 2017-03-31 MED ORDER — PLASMA-LYTE IV SOLN *WRAPPED*
500.0000 mL/h | Freq: Once | Status: AC
Start: 2017-04-01 — End: 2017-04-01
  Administered 2017-04-01: 500 mL/h via INTRAVENOUS

## 2017-03-31 MED ORDER — PLASMA-LYTE IV SOLN *WRAPPED*
500.0000 mL/h | Freq: Once | Status: AC
Start: 2017-03-31 — End: 2017-03-31
  Administered 2017-03-31: 500 mL/h via INTRAVENOUS

## 2017-03-31 MED ORDER — LEVOTHYROXINE SODIUM 25 MCG PO TABS *I*
137.0000 ug | ORAL_TABLET | Freq: Every day | ORAL | Status: DC
Start: 2017-03-31 — End: 2017-06-25
  Administered 2017-03-31 – 2017-06-17 (×75): 137 ug via ORAL
  Filled 2017-03-31 (×88): qty 1

## 2017-03-31 MED ORDER — HEPARIN SODIUM 5000 UNIT/ML SQ *I*
5000.0000 [IU] | Freq: Three times a day (TID) | SUBCUTANEOUS | Status: DC
Start: 2017-03-31 — End: 2017-04-03
  Administered 2017-03-31 – 2017-04-03 (×7): 5000 [IU] via SUBCUTANEOUS
  Filled 2017-03-31 (×7): qty 1

## 2017-03-31 MED ORDER — ALBUMIN HUMAN 25 % IV SOLN (250 MG/ML) *I*
25.0000 g | Freq: Four times a day (QID) | INTRAVENOUS | Status: AC
Start: 2017-03-31 — End: 2017-04-01
  Administered 2017-03-31 – 2017-04-01 (×5): 25 g via INTRAVENOUS
  Filled 2017-03-31 (×4): qty 100

## 2017-03-31 NOTE — Progress Notes (Signed)
Report Given To  Tiffany RN      Descriptive Sentence / Reason for Admission   Amanda Galloway is a 72 y.o. female with PMHx significant for pancreatic adenocarcinoma now s/p open Whipple on 03/29/17.  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      Active Issues / Relevant Events   Assumed patient care at [facility] on 03/30/17.  Patient alert and orientated times three.  Patient using PCA appropriately.  Patient states pain is relieved when using PCA.  Patient denies nausea.  Patient had low UOP, SICU notified.  1L Plasmalyte bolus given with good effect, urine increased after bolus.  Aline not functioning, SICU notified.  Aline discontinued per order, pressure held for five minutes dressing applied.  Patient hypotensive overnight, SICU aware.  Hypotension continued-1L plasmalyte bolus given.        To Do List  BG Q4        Anticipatory Guidance / Discharge Planning  ---Critical Care Bundle---    Fluids & Electrolytes: LR@84   Nutrition: clears   Mobility Plan/Ability: OOB 2 max asssit   Respiratory weaning/pulmonary toilet: room air / 1L NC  Neuro/sedation/pain: RTN Lyrica, IV Tylenol, IV Toradol, methadone  PCA dilaudid.  Delirium: No  Sleep Hygiene: Yes  List of antimicrobials:   Drains/Lines/Tubes: JP, PIVx2  Foley: Yes  DVT prophylaxis/aPTT: IPC, SQ Lovenox   Indication for gastric acid suppression: IV Protonix  Date of last BM: : Prior to admission   Type and frequency of Labs: Daily: CMP, Mag, Phos, CBC  Family Concerns and Updates: Daughter and Son at bedside 1/26  Patient prefers to be called (AKA):   Code Status: Full  Disposition: PCU  Scheryl Darter, RN

## 2017-03-31 NOTE — Addendum Note (Signed)
Addendum  created 03/31/17 1707 by Lorenza Chick, MD    Sign clinical note

## 2017-03-31 NOTE — Interdisciplinary Rounds (Signed)
Amanda Galloway is a 72 y.o. female with PMHx significant for pancreatic adenocarcinoma now s/p open Whipple on 03/29/17, also with depression, Fibromyalgia, and hypothyroidism. She had an open whipple done today, which was uncomplicated. 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      Foley:yes  Central Line:NA  DVT prophylaxis:lovenox SQ   Indication for gastric acid suppression:protonix  Sedation:NA  Mobility:out of bed to chair (PT ordered)  Flu Vaccine screen:       03/31/2017 daily interdisciplinary goals of care:  Keep MIVF's (LR@84 )  Bolus for hypotension   Systolic Blood pressure 93'Y  Attempt to ambulate  Albumin 2.4   Noon BMP and HCT      03/30/2017 daily interdisciplinary goals of care:  D/C NG tube   Start clear liquid diet  Start methadone (home medication)  Order PT

## 2017-03-31 NOTE — Progress Notes (Signed)
SICU Progress Note  Amanda Cullenis a 72 y.o.femalewith PMHx significant for pancreatic adenocarcinoma now s/p open Whipple on 03/29/17, also with depression, Fibromyalgia, and hypothyroidism. She had an open whipple done today, which was uncomplicated. 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    Interval History: 1500 cc for low uop.        Vital Signs:  BP (!) 86/58 (BP Location: Right arm)    Pulse 86    Temp 36.3 C (97.3 F) (Temporal)    Resp 18    Ht 1.702 m (5\' 7" )    Wt 72 kg (158 lb 11.7 oz)    SpO2 99%    BMI 24.86 kg/m     Physical Exam by Systems:  See     Assessment and Plan for Active/Followed Hospital Problems:   Active Hospital Problems    Diagnosis    *!*Pancreatic adenocarcinoma s/p Whipple 03/29/17     --S/p open Whipple on 1/25 with Dr. Ron Agee  --Duramorph spinal during case  --dPCA for post op pain  --Tylenol po  --Toradol 15mg  q6h for 3 days scheduled  --Lyrica 150mg  QHS  - methadone restarted 1/26  --LR 84cc/h while NPO  --ISS  --NG tube removed 1/26  - clears      Acute kidney failure     - monitor uop  -bolus fluid for uop < 30cc/hr      Acute blood loss anemia     --On admission to SICU H/H 10.2/34 (Basline Hct 30-40s)  --Will monitor hct 24      Hypothyroidism     --on synthroid  --will restart in the morning      Pancreatic cancer    Cancer associated pain     --Methadone at home--  --dPCA for post op pain 0.3mg  q48min prn  --Tylenol 1g q6h scheduled  --Toradol 15mg  q6h for 3 days scheduled  --Lyrica 150mg  QHS  - methadone home dose          ---SICU Critical Care  Bundle---  Fluids & Electrolytes: LR@84   Nutrition: clears   Mobility Plan/Ability: OOB 2 max asssit   Respiratory weaning/pulmonary toilet: room air / 1L NC  Neuro/sedation/pain: RTN Lyrica, IV Tylenol, IV Toradol, methadone  PCA dilaudid.  Delirium: No  Sleep Hygiene: Yes  List of  antimicrobials: none   Drains/Lines/Tubes: JP, PIVx2  Foley: Yes  DVT prophylaxis/aPTT: IPC, SQ heparin   Indication for gastric acid suppression: IV Protonix  Date of last BM: : Prior to admission   Type and frequency of Labs: Daily: CMP, Mag, Phos, CBC, BG Q4H, HCT, BMP at noon once  Family Concerns and Updates: Daughter and Son at bedside 1/26  Patient prefers to be called (AKA): Amanda Galloway   Code Status: Full  Disposition: PCU      Plan and orders for day reiterated by  nursing? Y    Author: Beverely Low, NP  as of: 03/31/2017  at: 8:33 AM

## 2017-03-31 NOTE — Plan of Care (Signed)
Mobility    • Functional status is maintained or improved - Geriatric Progressing towards goal        Pain/Comfort    • Patient's pain or discomfort is manageable Progressing towards goal        Safety    • Patient will remain free of falls Progressing towards goal

## 2017-03-31 NOTE — Anesthesia Postprocedure Evaluation (Signed)
Anesthesia Post-Op Note    Patient: Amanda Galloway    Procedure(s) Performed:  Procedure Summary  Date:  03/29/2017 Anesthesia Start: 03/29/2017  7:34 AM Anesthesia Stop: 03/29/2017  5:33 PM Room / Location:  [id number] / [place] [facility]   Procedure(s):  WHIPPLE PROCEDURE Diagnosis:  Malignant neoplasm of pancreas, unspecified location of malignancy [C25.9] Surgeon(s):  Delano Metz, MD  [name], MD  Riki Altes, DO Attending Anesthesiologist:  Ardath Sax, MD         Recovery Vitals  BP: 93/57 (03/31/2017  2:00 PM)  Arterial Line BP: 96/93 (03/30/2017  8:00 PM)  Heart Rate: 91 (03/31/2017  2:00 PM)  Heart Rate (via Pulse Ox): 93 (03/31/2017  2:00 PM)  Resp: 16 (03/31/2017  2:00 PM)  Temp: 36.3 C (97.3 F) (03/31/2017  8:00 AM)  SpO2: 92 % (03/31/2017  2:00 PM)  O2 Flow Rate: 1 L/min (03/31/2017  2:00 PM)   0-10 Scale: 0 (03/31/2017  2:00 PM)  NVPS (Critical Care) - Respiratory: 0 (03/30/2017 10:00 PM)  Anesthesia type:  General  Complications Noted During Procedure or in PACU:  None   Comment:  The patient has adequate pain control.  She is doing well.     Attending Attestation:  All indicated post anesthesia care provided       Complications Noted During Recovery Period:      None  Recovery from Anesthesia:      Recovered to baseline level of consciousness  Condition of patient:      Recovered to pre-anesthetic condition and Satisfactory

## 2017-03-31 NOTE — Progress Notes (Addendum)
Surgical Oncology/Hepatobiliary Surgery Progress Note     LOS: 2 days     Subjective:    1xL bolus for low UOP, 2nd L bolus given this am for hypotension  Reports lots of abdominal pain; PCA does help - per nursing was quite sleepy last night given po meds and PCA use  States she is confused although she answers questions appropriately - did ask "where do I live?"  No flatus.    Objective:    Vitals Sign Ranges for Past 24 Hours:  BP: (80-107)/(47-70)   Temp:  [35.9 C (96.6 F)-37 C (98.6 F)]   Temp src: Temporal (01/27 0400)  Heart Rate:  [80-108]   Resp:  [8-19]   SpO2:  [92 %-100 %]       Physical Exam:    General Appearance: alert, no distress  HEENT: NGT in place  Cardiac: RRR  Respiratory: non-labored  Abdomen: soft, TTP, midline dressing removed - incision c/d/i, JP with some serosang leakage around JP site, JP with small amount of serosang output  Genitourinary: foley in place  Extremities: warm, well-perfused  Neuro: alert and oriented to place, time, and self    Labs:    CBC:    Recent Labs  Lab 03/31/17  0415  03/30/17  0016 03/29/17  1758   WBC 10.0  --  14.3* 15.3*   Hemoglobin 7.5*  < > 10.1* 10.4*   11.4   Hematocrit 24*  --  31* 32*   Platelets 254  --  323 352   < > = values in this interval not displayed.    Metabolic Panel:    Recent Labs  Lab 03/31/17  0415 03/30/17  0852 03/30/17  0646 03/30/17  0410 03/30/17  0016 03/29/17  1758   Sodium 137  --   --   --  136 134   Potassium 4.7 5.0 CANCELED CANCELED CANCELED 4.6   Chloride 99  --   --   --  96 94*   CO2 29*  --   --   --  20 21   UN 21*  --   --   --  16 14   Creatinine 1.10*  --   --   --  0.94 0.73   Glucose 139*  --   --   --  165* 209*   Calcium 7.7*  --   --   --  8.0* 7.8*   Magnesium 2.6*  --   --   --  2.4 2.5   Phosphorus 4.6*  --   --   --  5.7* 4.9*          Assessment:    72 y.o. female with pancreatic cancer POD # 2  status post whipple procedure.    Reports some confusion. HCT 24 (31). Hypotensive this am - no  improvement.    Plan:    -- Transfuse 1U PRBC and repeat CBC/BMP afterwards given hypotension unresponsive to crystalloid and Hct decrease  -- Would change lovenox to sq heparin  -- If ongoing pain issues, would consult APS  -- Continue foley for now given low UOP and hypotension overnight requiring bolus  -- Continue clears  -- Appreciate excellent SICU care      Donata Duff, MD  03/31/2017     8:15 AM  General Surgery Resident, PGY-3

## 2017-03-31 NOTE — Progress Notes (Signed)
Critical Care Medicine Attending Note:      If there are key historical elements or objective findings that I think deserve particular emphasis, I have recorded them below. A summary of key elements of our assessment and plans is listed in the resident / medical student / PA / NP's note. Please refer to this note for complete details of our mutually agreed upon findings, assessment, and recommendations.  The problem list reflects my input.    Summary of history of present illness:   The patient is a 72 year old woman with a medical history significant for hypertension, pulmonary embolism (diagnosed in November 2018 (on Lovenox), fibromyalgia, gastroesophageal reflux disease, hypothyroidism, and anxiety/depression.  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  She had a cholecystectomy that failed to relieve her symptoms.  She has completed neoadjuvant chemotherapy and radiation therapy.  She has required methadone for chronic abdominal pain.  On 1/25, she underwent a Whipple procedure.  The surgical procedure and anesthetic were uncomplicated.  She was admitted to the SICU service for ongoing management and the expectation of complicated pain management.    Interval history:  No significant new problems noted overnight.   She notes continued pain.  She has not had flatus.       Objective Section:  BP: (80-107)/(47-70)   Temp:  [35.9 C (96.6 F)-37 C (98.6 F)]   Temp src: Temporal (01/27 0800)  Heart Rate:  [80-108]   Resp:  [8-20]   SpO2:  [86 %-100 %]       O2 Flow Rate: 1 L/min (03/31/17 1000)      Intake/Output last 3 shifts:  I/O last 3 completed shifts:  01/26 0700 - 01/27 0659  In: 4637 (64.4 mL/kg) [P.O.:3; I.V.:2470 (1.4 mL/kg/hr); NG/GT:64; IV Piggyback:2100]  Out: 875 (12.2 mL/kg) [Urine:790 (0.5 mL/kg/hr); Emesis/NG output:75; Drains:10]  Net: 3762  Weight: 72 kg      Intake/Output this shift:  I/O this shift:  01/27 0700 - 01/27 1459  In: 476 (6.6 mL/kg) [P.O.:120; I.V.:356]  Out: 230 (3.2 mL/kg) [Urine:230]  Net: 246  Weight: 72 kg       Vent settings for last 24 hours:       Physical exam:      General: The patient is awake, alert, interactive, and appropriate.   She appears more comfortable than yesterday.    Pulmonary: Symmetric breath sounds, essentially clear bilaterally.     Cardiac: Regular rate and rhythm.     Abdominal: Very hypoactive bowel sounds, softly distended.  The midline incision is dry and intact without erythema or swelling.      Extremities: Warm, pink, minimal edema.      Labs:  Lab Results   Component Value Date    WBC 10.0 03/31/2017    HCT 24 (L) 03/31/2017    PLT 254 03/31/2017     Lab Results   Component Value Date    NA 137 03/31/2017    K 4.7 03/31/2017    CL 99 03/31/2017    CO2 29 (H) 03/31/2017    UN 21 (H) 03/31/2017    CREAT 1.10 (H) 03/31/2017    WBGLU 138 (H) 03/30/2017    PGLU 130 (H) 03/31/2017     Lab Results   Component Value Date    CA 7.7 (L) 03/31/2017    MG 2.6 (H) 03/31/2017  PO4 4.6 (H) 03/31/2017     Lab Results   Component Value Date    PTI 10.7 03/29/2017    INR 1.0 03/29/2017     Lab Results   Component Value Date    ALT 183 (H) 03/31/2017    AST 198 (H) 03/31/2017    ALK 84 03/31/2017     Bilirubin,Total   Date Value Ref Range Status   03/31/2017 <0.2 0.0 - 1.2 mg/dL Final           Lab results: 03/30/17  0843   Lactate ART,WB 3.7*              Currently Active/Followed Hospital Problems:  Active Hospital Problems    Diagnosis    *!*Pancreatic adenocarcinoma s/p Whipple 03/29/17     --S/p open Whipple on 1/25 with Dr. Ron Agee  --Duramorph spinal during case  --dPCA for post op pain  --Tylenol po  --Toradol 81m q6h for 3 days scheduled  --Lyrica [id number]  - methadone restarted 1/26  --LR 84cc/h while NPO  --ISS  --NG tube removed 1/26  - clears       hx of Pulmonary embolism     - PE 01/18/17 on CT chest with IV contrast   Was on therapeutic - lovenox  - will check hct and discuss with primary team when appropriate to resume with recent hct drop      Acute kidney insufficiency     - monitor uop  -bolus fluid for uop < 30cc/hr      Acute blood loss anemia     --On admission to SICU H/H 10.2/34 (Basline Hct 30-40s)  --Will monitor hct 24      Hypothyroidism     --on synthroid  --will restart in the morning      Pancreatic cancer    Cancer associated pain     --Methadone at home--  --dPCA for post op pain [id number]  --Tylenol 1g q6h scheduled  --Toradol 15mg70m for 3 days scheduled  --Lyrica 150mg 21m - methadone home dose           Attending summary impressions:     The patient is doing reasonably well following Whipple procedure for pancreatic cancer.   She likely has a mild SIRS with mild hypotension and mild oliguria.  She responds nicely to IV fluid challenges.  Clinically, she is perfusing well.  Will monitor her serum lactate.   The patient is oxygenating and ventilating well on room air.  Will continue to encourage pulmonary toilet.  The patient has resolved respiratory insufficiency.    She has had some oliguria which responds nicely to IV fluid challenges.   She has a very mild acute kidney injury (slightly increased serum creatinine level).   The patient has acceptable anemia.  Her last hematocrit was significantly lower than the last value.  This may be dilutional due to extra isotonic crystalloid she has received.  Will recheck.  No transfusion is needed.    Her pain control seems better.  Staff has noted some confusion.  She is getting scheduled acetaminophen, ketorolac, and pregabalin.  She has a hydromorophone PCA and is getting her home dose of oral hydromorphone.   She had a PE in November 2018.  Therapeutic anticoagulation is on hold.  Will discuss resumption with the surgical team.   She is tolerating some clears.   Encouraging activity.   She is overall improving and can likely be transferred  to the  floor soon.    Attending Attestation    This is a high complexity patient.     '[X]'  This patient is critically ill with at least 1 organ system failure including s/p Whipple procedure, acute kidney injury, and anemia. The care I have delivered involved high complexity decision making to assess, manipulate, and support vital system function(s), to treat the vital organ system failure and/or prevent further deterioration of the patient's condition. All nursing documentation, laboratory data, test results, and radiographs were reviewed and interpreted by me. I have established the management plan for this patient's critical illness and have been immediately available to assist with patient care. My documented total critical care time reflects my own patient care time and does not include teaching or procedure time.     Critical care time: Personal time spent - (exclusive of performance of any procedures performed in the last 24 hours):  25 minutes    Signed by: Lorenza Chick, MD as of 03/31/2017 at 10:39 AM.

## 2017-03-31 NOTE — Progress Notes (Signed)
Report Given To  Tiffany RN      Descriptive Sentence / Reason for Admission   Amanda Galloway is a 72 y.o. female with PMHx significant for pancreatic adenocarcinoma now s/p open Whipple on 03/29/17.  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      Active Issues / Relevant Events   Assumed patient care at [place] on 03/30/17.  Patient alert and orientated times three.  Patient using PCA appropriately.  Patient states pain is relieved when using PCA.  Patient denies nausea.  Patient had low UOP, SICU notified.  1L Plasmalyte bolus given with good effect, urine increased after bolus.  Aline not functioning, SICU notified.  Aline discontinued per order, pressure held for five minutes dressing applied.  Patient hypotensive overnight, SICU aware.  Hypotension continued-1L plasmalyte bolus given.        To Do List  BG Q4        Anticipatory Guidance / Discharge Planning  ---Critical Care Bundle---    Fluids & Electrolytes: LR@84   Nutrition: clears   Mobility Plan/Ability: OOB 2 max asssit   Respiratory weaning/pulmonary toilet: room air / 1L NC  Neuro/sedation/pain: RTN Lyrica, IV Tylenol, IV Toradol, methadone  PCA dilaudid.  Delirium: No  Sleep Hygiene: Yes  List of antimicrobials:   Drains/Lines/Tubes: JP, PIVx2  Foley: Yes  DVT prophylaxis/aPTT: IPC, SQ Lovenox   Indication for gastric acid suppression: IV Protonix  Date of last BM: : Prior to admission   Type and frequency of Labs: Daily: CMP, Mag, Phos, CBC  Family Concerns and Updates: Daughter and Son at bedside 1/26  Patient prefers to be called (AKA):   Code Status: Full  Disposition: PCU  Scheryl Darter, RN

## 2017-03-31 NOTE — Progress Notes (Signed)
Report Given To  Tiffany RN      Descriptive Sentence / Reason for Admission   Amanda Galloway is a 72 y.o. female with PMHx significant for pancreatic adenocarcinoma now s/p open Whipple on 03/29/17.  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      Active Issues / Relevant Events   Assumed patient care at [place] on 03/30/17.  Patient alert and orientated times three.  Patient using PCA appropriately.  Patient states pain is relieved when using PCA.  Patient denies nausea.  Patient had low UOP, SICU notified.  1L Plasmalyte bolus given with good effect, urine increased after bolus.  Aline not functioning, SICU notified.  Aline discontinued per order, pressure held for five minutes dressing applied.  Patient hypotensive overnight, SICU aware.  Hypotension continued-1L plasmalyte bolus given.        To Do List  BG Q4        Anticipatory Guidance / Discharge Planning  ---Critical Care Bundle---    Fluids & Electrolytes: LR@84   Nutrition: clears   Mobility Plan/Ability: OOB 2 max asssit   Respiratory weaning/pulmonary toilet: room air / 1L NC  Neuro/sedation/pain: RTN Lyrica, IV Tylenol, IV Toradol, methadone  PCA dilaudid.  Delirium: No  Sleep Hygiene: Yes  List of antimicrobials:   Drains/Lines/Tubes: JP, PIVx2  Foley: Yes  DVT prophylaxis/aPTT: IPC, SQ Lovenox   Indication for gastric acid suppression: IV Protonix  Date of last BM: : Prior to admission   Type and frequency of Labs: Daily: CMP, Mag, Phos, CBC  Family Concerns and Updates: Daughter and Son at bedside 1/26  Patient prefers to be called (AKA):   Code Status: Full  Disposition: PCU  Scheryl Darter, RN

## 2017-04-01 ENCOUNTER — Encounter: Payer: Self-pay | Admitting: Surgical Oncology

## 2017-04-01 DIAGNOSIS — I2699 Other pulmonary embolism without acute cor pulmonale: Secondary | ICD-10-CM

## 2017-04-01 DIAGNOSIS — N289 Disorder of kidney and ureter, unspecified: Secondary | ICD-10-CM

## 2017-04-01 DIAGNOSIS — D62 Acute posthemorrhagic anemia: Secondary | ICD-10-CM

## 2017-04-01 LAB — CBC
Hematocrit: 20 % — ABNORMAL LOW (ref 34–45)
Hematocrit: 21 % — ABNORMAL LOW (ref 34–45)
Hemoglobin: 6.3 g/dL — ABNORMAL LOW (ref 11.2–15.7)
Hemoglobin: 6.2 g/dL — ABNORMAL LOW (ref 11.2–15.7)
MCH: 32 pg/cell (ref 26–32)
MCH: 31 pg/cell (ref 26–32)
MCHC: 31 g/dL — ABNORMAL LOW (ref 32–36)
MCHC: 30 g/dL — ABNORMAL LOW (ref 32–36)
MCV: 103 fL — ABNORMAL HIGH (ref 79–95)
MCV: 105 fL — ABNORMAL HIGH (ref 79–95)
Platelets: 214 10*3/uL (ref 160–370)
Platelets: 201 10*3/uL (ref 160–370)
RBC: 2 MIL/uL — ABNORMAL LOW (ref 3.9–5.2)
RBC: 2 MIL/uL — ABNORMAL LOW (ref 3.9–5.2)
RDW: 16.6 % — ABNORMAL HIGH (ref 11.7–14.4)
RDW: 16.7 % — ABNORMAL HIGH (ref 11.7–14.4)
WBC: 9.4 10*3/uL (ref 4.0–10.0)
WBC: 7.7 10*3/uL (ref 4.0–10.0)

## 2017-04-01 LAB — COMPREHENSIVE METABOLIC PANEL
ALT: 118 U/L — ABNORMAL HIGH (ref 0–35)
AST: 134 U/L — ABNORMAL HIGH (ref 0–35)
Albumin: 3.5 g/dL (ref 3.5–5.2)
Alk Phos: 102 U/L (ref 35–105)
Anion Gap: 13 (ref 7–16)
Bilirubin,Total: <0.2 mg/dL (ref 0.0–1.2)
CO2: 27 mmol/L (ref 20–28)
Calcium: 8.2 mg/dL — ABNORMAL LOW (ref 8.6–10.2)
Chloride: 99 mmol/L (ref 96–108)
Creatinine: 0.85 mg/dL (ref 0.51–0.95)
GFR,Black: 80 *
GFR,Caucasian: 69 *
Glucose: 130 mg/dL — ABNORMAL HIGH (ref 60–99)
Lab: 17 mg/dL (ref 6–20)
Potassium: 4.9 mmol/L (ref 3.3–5.1)
Sodium: 139 mmol/L (ref 133–145)
Total Protein: 5.2 g/dL — ABNORMAL LOW (ref 6.3–7.7)

## 2017-04-01 LAB — CBC AND DIFFERENTIAL
Baso # K/uL: 0 10*3/uL (ref 0.0–0.1)
Basophil %: 0 %
Eos # K/uL: 0 10*3/uL (ref 0.0–0.4)
Eosinophil %: 0 %
Hematocrit: 24 % — ABNORMAL LOW (ref 34–45)
Hemoglobin: 7.4 g/dL — ABNORMAL LOW (ref 11.2–15.7)
IMM Granulocytes #: 0.1 10*3/uL
IMM Granulocytes: 0.6 %
Lymph # K/uL: 0.6 10*3/uL — ABNORMAL LOW (ref 1.2–3.7)
Lymphocyte %: 7 %
MCH: 30 pg/cell (ref 26–32)
MCHC: 31 g/dL — ABNORMAL LOW (ref 32–36)
MCV: 97 fL — ABNORMAL HIGH (ref 79–95)
Mono # K/uL: 0.3 10*3/uL (ref 0.2–0.9)
Monocyte %: 3.1 %
Neut # K/uL: 7.3 10*3/uL — ABNORMAL HIGH (ref 1.6–6.1)
Nucl RBC # K/uL: 0 10*3/uL (ref 0.0–0.0)
Nucl RBC %: 0 /100 WBC (ref 0.0–0.2)
Platelets: 203 10*3/uL (ref 160–370)
RBC: 2.5 MIL/uL — ABNORMAL LOW (ref 3.9–5.2)
RDW: 21.6 % — ABNORMAL HIGH (ref 11.7–14.4)
Seg Neut %: 89.3 %
WBC: 8.2 10*3/uL (ref 4.0–10.0)

## 2017-04-01 LAB — POCT GLUCOSE
Glucose POCT: 137 mg/dL — ABNORMAL HIGH (ref 60–99)
Glucose POCT: 135 mg/dL — ABNORMAL HIGH (ref 60–99)
Glucose POCT: 134 mg/dL — ABNORMAL HIGH (ref 60–99)
Glucose POCT: 130 mg/dL — ABNORMAL HIGH (ref 60–99)
Glucose POCT: 128 mg/dL — ABNORMAL HIGH (ref 60–99)
Glucose POCT: 131 mg/dL — ABNORMAL HIGH (ref 60–99)

## 2017-04-01 LAB — BLOOD GASES AND WHOLE BLD ANALYTES ART
Anion Gap,WB: 15 (ref 7–16)
Base Excess, Arterial: -4 mmol/L — ABNORMAL LOW (ref <=2)
CO2,ART (Calc): 22 mmol/L (ref 21–28)
CO: 0.3 %
Chloride,WB: 98 mmol/L (ref 98–108)
FO2 Hb, Arterial: 98 % — ABNORMAL HIGH (ref 90–95)
Glucose,WB: 216 mg/dL — ABNORMAL HIGH (ref 60–99)
HCO3, Arterial: 21 mmol/L (ref 19–23)
HCT (Calc): 34 % (ref 34–45)
Hemoglobin: 11.4 g/dL (ref 11.2–15.7)
ICA @7.4,WB: 4.1 mg/dL — ABNORMAL LOW (ref 4.8–5.2)
ICA Uncorr,WB: 4.2 mg/dL
Methemoglobin: 0.3 % (ref 0.0–1.0)
NA, WB: 130 mmol/L — ABNORMAL LOW (ref 135–145)
Potassium,WB: 4.3 mmol/L (ref 3.4–4.7)
pCO2, Arterial: 39 mm Hg (ref 33–43)
pH: 7.36 (ref 7.36–7.44)
pO2,Arterial: 191 mm Hg — ABNORMAL HIGH (ref 80–100)

## 2017-04-01 LAB — TYPE AND SCREEN
ABO RH Blood Type: A NEG
Antibody Screen: NEGATIVE

## 2017-04-01 LAB — MAGNESIUM: Magnesium: 2.5 mg/dL (ref 1.6–2.5)

## 2017-04-01 LAB — PHOSPHORUS: Phosphorus: 3.9 mg/dL (ref 2.7–4.5)

## 2017-04-01 MED ORDER — SODIUM CHLORIDE 0.9 % IV SOLN WRAPPED *I*
3.0000 mL/h | Status: DC
Start: 2017-04-01 — End: 2017-04-01

## 2017-04-01 MED ORDER — PLASMA-LYTE IV SOLN *WRAPPED*
500.0000 mL/h | Freq: Once | Status: AC
Start: 2017-04-01 — End: 2017-04-01
  Administered 2017-04-01: 500 mL/h via INTRAVENOUS

## 2017-04-01 MED ORDER — ONDANSETRON HCL 2 MG/ML IV SOLN *I*
4.0000 mg | Freq: Three times a day (TID) | INTRAMUSCULAR | Status: DC | PRN
Start: 2017-04-01 — End: 2017-04-03
  Administered 2017-04-02 – 2017-04-03 (×3): 4 mg via INTRAVENOUS
  Filled 2017-04-01 (×3): qty 2

## 2017-04-01 MED ORDER — ACETAMINOPHEN 500 MG PO TABS *I*
1000.0000 mg | ORAL_TABLET | Freq: Three times a day (TID) | ORAL | Status: DC
Start: 2017-04-02 — End: 2017-04-05
  Administered 2017-04-01 – 2017-04-04 (×8): 1000 mg via ORAL
  Filled 2017-04-01 (×7): qty 2

## 2017-04-01 MED ORDER — ACETAMINOPHEN 500 MG PO TABS *I*
1000.0000 mg | ORAL_TABLET | Freq: Four times a day (QID) | ORAL | Status: DC
Start: 2017-04-01 — End: 2017-04-01
  Filled 2017-04-01: qty 2

## 2017-04-01 MED ORDER — INSULIN LISPRO (HUMAN) 100 UNIT/ML IJ/SC SOLN *WRAPPED*
0.0000 [IU] | Freq: Four times a day (QID) | SUBCUTANEOUS | Status: DC
Start: 2017-04-01 — End: 2017-04-04

## 2017-04-01 MED ORDER — PANTOPRAZOLE SODIUM 40 MG PO TBEC *I*
40.0000 mg | DELAYED_RELEASE_TABLET | Freq: Every morning | ORAL | Status: DC
Start: 2017-04-02 — End: 2017-04-04
  Administered 2017-04-02 – 2017-04-04 (×3): 40 mg via ORAL
  Filled 2017-04-01 (×4): qty 1

## 2017-04-01 MED ORDER — DEXAMETHASONE 1 MG/ML PO CONCENTRATED *I*
4.0000 mg | Freq: Every day | ORAL | Status: DC
Start: 2017-04-02 — End: 2017-04-04
  Administered 2017-04-02 – 2017-04-04 (×3): 4 mg via NASOGASTRIC
  Filled 2017-04-01 (×4): qty 4

## 2017-04-01 MED ORDER — SENNOSIDES 8.6 MG PO TABS *I*
2.0000 | ORAL_TABLET | Freq: Two times a day (BID) | ORAL | Status: DC
Start: 2017-04-01 — End: 2017-04-08
  Administered 2017-04-01 – 2017-04-08 (×11): 2 via ORAL
  Filled 2017-04-01 (×11): qty 2

## 2017-04-01 MED ORDER — DOCUSATE SODIUM 100 MG PO CAPS *I*
200.0000 mg | ORAL_CAPSULE | Freq: Two times a day (BID) | ORAL | Status: DC
Start: 2017-04-01 — End: 2017-04-08
  Administered 2017-04-01 – 2017-04-08 (×11): 200 mg via ORAL
  Filled 2017-04-01 (×11): qty 2

## 2017-04-01 NOTE — Progress Notes (Signed)
Report Given To  Baxter Flattery, RN       Descriptive Sentence / Reason for Admission   Amanda Galloway is a 72 y.o. female with PMHx significant for pancreatic adenocarcinoma now s/p open Whipple on 03/29/17.  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      Active Issues / Relevant Events   Assumed pt care from 0700-1900. VSSA. Weaned to RA. Increasingly confused throughout shift, per daughter dilaudid PCA has made her delirious in the past. Hct 20 from 23, redraw was 21, given 1 unit PRBCs. Low UOP, team notified, no interventions. MIVF d/c. Foley d/c @ 1400, has not yet voided. Not using PCA appropriately, hit button 22x and got 16 doses but states pain is adequately controlled. Per Dr. Ron Agee pt can be advanced to regular diet but no orders yet. Transferred to [facility] with RN with no acute events. For full assessments, VS and I&Os, see eMR.       To Do List  BG Q4        Anticipatory Guidance / Discharge Planning  ---Critical Care Bundle---    Fluids & Electrolytes: None  Nutrition: Clear liquid diet (poor appetite)  Mobility Plan/Ability: OOBTC 2 person assist, PT consulting  Respiratory weaning/pulmonary toilet: RA  Neuro/sedation/pain: Dilaudid PCA, ATC methadone, Lyrica & Lexapro  Delirium: Yes  Sleep Hygiene: Yes  List of antimicrobials: None   Drains/Lines/Tubes: JP x1, PIVx1  Foley: Yes  DVT prophylaxis/aPTT: IPCs, SQ heparin   Indication for gastric acid suppression: IV Protonix  Date of last BM: : PTA  Type and frequency of Labs: Daily: CMP, Mag, Phos, CBC BG Q4hr  Family Concerns and Updates: Daughter and Son at bedside 1/26  Patient prefers to be called (AKA): Amanda Galloway   Code Status: Full  Disposition: PCU  Riesa Pope, RN

## 2017-04-01 NOTE — Progress Notes (Signed)
Report Given To  Karmen Stabs RN      Descriptive Sentence / Reason for Admission   Amanda Galloway is a 72 y.o. female with PMHx significant for pancreatic adenocarcinoma now s/p open Whipple on 03/29/17.  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      Active Issues / Relevant Events   Assumed patient care at [place] on 03/31/17.  Patient alert and orientated times two, place and self.  Patient states pain is well controlled with PCA, states she has relief when she presses button.  Patient denies nausea, tolerating clears.  UOP low, MICU notified.  500cc Plasmalyte bolus given with positive effect.  Patient slept well overnight.  HCT 20, SICU notified.        To Do List  BG Q4        Anticipatory Guidance / Discharge Planning  ---Critical Care Bundle---    Fluids & Electrolytes: LR@84   Nutrition: clears   Mobility Plan/Ability: OOB 2 max asssit   Respiratory weaning/pulmonary toilet: room air / 1L NC  Neuro/sedation/pain: RTN Lyrica, IV Tylenol, IV Toradol, methadone  PCA dilaudid.  Delirium: No  Sleep Hygiene: Yes  List of antimicrobials: none   Drains/Lines/Tubes: JP, PIVx2  Foley: Yes  DVT prophylaxis/aPTT: IPC, SQ heparin   Indication for gastric acid suppression: IV Protonix  Date of last BM: : Prior to admission   Type and frequency of Labs: Daily: CMP, Mag, Phos, CBC, BG Q4H, HCT, BMP at noon once  Family Concerns and Updates: Daughter and Son at bedside 1/26  Patient prefers to be called (AKA): [name]   Code Status: Full  Disposition: PCU  Scheryl Darter, RN

## 2017-04-01 NOTE — Plan of Care (Signed)
Safety    • Patient will remain free of falls Maintaining          Mobility    • Functional status is maintained or improved - Geriatric Progressing towards goal        Pain/Comfort    • Patient's pain or discomfort is manageable Progressing towards goal

## 2017-04-01 NOTE — Progress Notes (Signed)
Sign out given to Teofilo Pod MD prior to patient transfer to the floor.     Drue Dun, NP  04/01/17  1:58 PM

## 2017-04-01 NOTE — Progress Notes (Signed)
Aware of new PT orders.  Patient is currently on PT for 3-5x/week with recommendations for return to prior living with home PT. Pt seen by Physical Therapy this AM with recommendations for 1A with walker. Pt was able to ambulate 55ft in session.  Will continue PT on current plan.  [name], PT   Pager [phone]

## 2017-04-01 NOTE — Plan of Care (Signed)
Problem: Impaired Bed Mobility  Goal: STG - IMPROVE BED MOBILITY  Patient will perform bed mobility without rails and the head of bed flat with No assist (Independently)     Time frame: 7-10 days    Problem: Impaired Transfers  Goal: STG - IMPROVE TRANSFERS  Patient will complete Sit to stand transfers using least restrictive assistive device with Modified independence     Time frame: 7-10 days    Problem: Impaired Ambulation  Goal: STG - IMPROVE AMBULATION  Patient will ambulate 150-299 feet using least restrictive assistive device with Modified independence    Time frame: 7-10 days    Problem: Impaired Stair Navigation  Goal: STG - IMPROVE STAIR NAVIGATION  Patient will navigate less than 4 steps with No rail(s) and least restrictive assistive device and Stand by assist of 1    Time frame: 7-10 days

## 2017-04-01 NOTE — Progress Notes (Addendum)
Surgical Oncology/Hepatobiliary Surgery Progress Note     LOS: 3 days     Subjective:    Received albumin x4.  1x bolus for low UOP overnight.  Reports not remembering her night.  She is needed the PCA regularly.    Objective:    Vitals Sign Ranges for Past 24 Hours:  BP: (84-118)/(51-70)   Temp:  [35.9 C (96.6 F)-36.7 C (98.1 F)]   Temp src: Temporal (01/28 0400)  Heart Rate:  [68-91]   Resp:  [11-26]   SpO2:  [86 %-99 %]       Physical Exam:    General Appearance: alert, no distress  HEENT: NGT in place  Cardiac: RRR  Respiratory: non-labored  Abdomen: soft, TTP, midline incision c/d/i, JP with some serosang leakage around JP site, JP with small amount of serosang output  Genitourinary: foley in place  Extremities: warm, well-perfused  Neuro: alert and oriented to place, time, and self    Labs:    CBC:    Recent Labs  Lab 04/01/17  0428 03/31/17  1831 03/31/17  1240 03/31/17  0415  03/30/17  0016 03/29/17  1758   WBC 9.4  --  9.3 10.0  --  14.3* 15.3*   Hemoglobin 6.3* 6.9* 6.7* 7.5*  < > 10.1* 11.4   10.4*   Hematocrit 20* 23* 22* 24*  --  31* 32*   Platelets 214  --  248 254  --  323 352   < > = values in this interval not displayed.    Metabolic Panel:    Recent Labs  Lab 03/31/17  1240 03/31/17  0415 03/30/17  0852 03/30/17  0646 03/30/17  0410 03/30/17  0016 03/29/17  1758   Sodium 137 137  --   --   --  136 134   Potassium 4.3 4.7 5.0 CANCELED CANCELED CANCELED 4.6   Chloride 99 99  --   --   --  96 94*   CO2 29* 29*  --   --   --  20 21   UN 18 21*  --   --   --  16 14   Creatinine 0.89 1.10*  --   --   --  0.94 0.73   Glucose 128* 139*  --   --   --  165* 209*   Calcium 7.8* 7.7*  --   --   --  8.0* 7.8*   Magnesium  --  2.6*  --   --   --  2.4 2.5   Phosphorus  --  4.6*  --   --   --  5.7* 4.9*          Assessment:    72 y.o. female with pancreatic cancer POD # 3  status post whipple procedure.    Reports some confusion. HCT 20 (23).    Plan:    -- Please transfuse for Hct 20  -- Would hold the  toradol given low Hct  -- If ongoing pain issues, would consult APS  -- Continue foley for now given low UOP  -- Continue clears  -- Appreciate excellent SICU care      Donata Duff, MD  04/01/2017     7:55 AM  General Surgery Resident, PGY-3    [facility]-GI Attending Addendum:   I personally examined the patient, reviewed the notes, and discussed the plan of care with the residents and patient. Agree with detailed resident's note. Please see the note above  for details of history, exam, labs, assessment/plan which reflect my input.     Seen and evaluated at 12pm 06/30/17 note delayed.     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     Pulmonary insufficiency with need for oxygen supplementation.  Hyperglycemia without need for insulin. Monitor.   Post operative anemia. No evidence of bleeding. Dilutional. transfusion single unit pRBC with appropriate increase.   Hypoalbuminemia. Malnutrition.   Elevated liver enzymes without hyperbilirubinemia.   May transfer to surgical floor.    Delano Metz, MD, FACS  Hepatobiliary, Pancreas & GI Surgery

## 2017-04-01 NOTE — Progress Notes (Addendum)
04/01/17 1535   Vital Signs   Temp 36.4 C (97.5 F)   Temp src TEMPORAL   Heart Rate 72   Heart Rate Source Automated/oximeter   Resp 16   BP 120/76   BP Method Manual   BP Location Right arm   Patient Position Lying   Oxygen Therapy   SpO2 96 %   O2 Device None (Room air)     VSS, pt arrived to the unit from 10-1398 at 15:35 ambulated with a heavy 2 assist and walker from bed in hall to bed in room.  Oriented to the unit and call bell.  Remained disoriented with time and location but recognizes she is in the hospital after having surgery.  Continues to state things that do not make sense to what is being discussed.  Reinforced the proper method of using the PCA.  Foley removed at 14:00 in the ICU, has yet to void.  Have been reinforcing the  Importance of drinking fluids.  Tolerating advancement in diet.  Will continue to monitor patient. Dortha Schwalbe, RN      4 Eyed Skin Assessment        Amanda Galloway is a 72 y.o. year old female, admitted/transferred to unit on **04/01/17 at 15:35.    []  New Admission  [x]  Transfer: Unit 10-1398    4 Eyed Skin Assessment:  Skin Breakdown Present: Yes []   No [x]    IF Yes - Suspected Pressure Injury: Yes []   No [x]    IF No- Are there other skin concerns?: n/a       Complete 4 Eyed Skin Check Completed with: Steve Rattler, RN

## 2017-04-01 NOTE — Interdisciplinary Rounds (Signed)
Amanda Galloway is a 72 y.o. female with PMHx significant for pancreatic adenocarcinoma now s/p open Whipple on 03/29/17, also with depression, Fibromyalgia, and hypothyroidism. She had an open whipple done today, which was uncomplicated. 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      Foley:yes  Central Line: NA  DVT prophylaxis:lovenox SQ (hold)  Indication for gastric acid suppression:protonix  Sedation:NA  Mobility:out of bed to chair (PT ordered)  Flu Vaccine screen:     04/01/2017 daily interdisciplinary goals of care:  D/C MIVF  Bolus for low UOP  D/C toradol  Continue holding lovenox  1 unit RBC  Sleep hygiene     03/31/2017 daily interdisciplinary goals of care:  Keep MIVF's (LR@84 )  Bolus for hypotension   Systolic Blood pressure 37'T  Attempt to ambulate  Albumin 2.4   Noon BMP and HCT      03/30/2017 daily interdisciplinary goals of care:  D/C NG tube   Start clear liquid diet  Start methadone (home medication)  Order PT

## 2017-04-01 NOTE — Progress Notes (Signed)
Physical Therapy Initial Evaluation:     History of Present Admission: Amanda Cullenis a 72 y.o.femalewith PMHx significant for pancreatic adenocarcinoma now s/p open Whipple on 03/29/17, also with depression, Fibromyalgia, and hypothyroidism. She had an open whipple done today, which was uncomplicated. 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    Past Medical History:   Diagnosis Date    Acute kidney failure 03/31/2017    Cancer     Depression     Fibromyalgia     Hypothyroidism         Past Surgical History:   Procedure Laterality Date    CHOLECYSTECTOMY      CHOLECYSTECTOMY, LAPAROSCOPIC  09/06/2016    HYSTERECTOMY  08/1986    Fibroids    KNEE SURGERY Right     PR INSERT TUNNELED CV CATH WITH PORT Right 10/04/2016    Procedure: Right IJ MEDIPORT Insertion;  Surgeon: Delano Metz, MD;  Location: [facility] MAIN OR;  Service: Oncology General    PR LAP,DIAGNOSTIC ABDOMEN N/A 10/04/2016    Procedure: LAPAROSCOPY DIAGNOSTIC;  Surgeon: Delano Metz, MD;  Location: [facility] MAIN OR;  Service: Oncology General    ROTATOR CUFF REPAIR Right     TONSILLECTOMY AND ADENOIDECTOMY         Personal factors affecting treatment/recovery:   Advanced age   Recent hospital admission    Comorbidities affecting treatment/recovery:   Cancer (chemotherapy/radiation therapy)   Fibromyalgia    Clinical presentation:   stable    Patient complexity:     low level as indicated by above stability of condition, personal factors, environmental factors and comorbidities in addition to their impairments found on physical exam.     04/01/17 1000   Prior Living    Prior Living Situation Reported by patient   Lives With Family  (Daughter)   Type of Home split level home   # Steps to Enter Home 2   # Rails to [employer] Home 0   # Of Steps In Home 2   # Rails in Home 0   Prior Function Level   Prior Function Level Reported by  patient   Engineer, mining None   Walking Independent   Walking assistive devices used None   Stair negotiation Independent   PT Tracking   PT [place]   Visit Number   Visit Number [facility]) / Treatment Day (HH) 1   Precautions/Observations   LDA Observation Foley cath;PCA;Monitors;O2 (comment)   Fall Precautions General falls precautions   Pain Assessment   *Is the patient currently in pain? Denies   Vision    Current Vision Wears corrective lenses   Cognition   Arousal/Alertness Appropriate responses to stimuli   Orientation Disoriented to time;Oriented to place;Oriented to person;Oriented to situation   Following Commands Follows simple commands with increased time   Additional Comments Patient frequently confused throughout the session, but realizing she is confused.    UE Assessment   UE Assessment Full range RUE AROM;Full range LUE AROM   LE Assessment   LE Assessment Full AROM RLE;Full AROM LLE   Bed Mobility   Bed mobility Tested   Supine to Sit Verbal cues;Head of bed elevated;Side rails up (#)   Sit to Supine Not tested   Additional comments Patient cued for sequencing. Increased time needed to complete. Patient required contact guard assist to scoot to edge  of bed.    Transfers   Transfers Tested   Sit to Stand Minimum    Stand to sit Minimum    Transfer Assistive Device rollator walker   Additional comments Pt completed transfer from edge of bed x2; to/from stryker chair x2. She needs consistent cuing for hand placement and sequencing; assist to achieve full upright. Initially unsteady requiring prolonged static standing to gain balance. Weight shifting completed in standing.   Mobility   Mobility Tested   Gait Pattern Decreased cadence;Decreased R step length;Decreased L step length;Unsteady   Ambulation Assist Minimal   Ambulation Distance (Feet) 30, 80, 40   Ambulation Assistive Device rollator walker   Additional comments Patient completed ambulation with chair  follow. She required seated rest breaks due to LE fatigue. Vital signs remained stable. Patient needing frequent cuing for navigational purposes.    Balance   Balance Tested   Sitting - Static Supported;Standby assist   Sitting - Dynamic Supported;Contact guard   Standing - Static Supported;Min assist   Standing - Dynamic Supported;Min assist   PT AM-PAC Mobility   Turning over in bed? 1   Sitting down on and standing up from a chair with arms? 1   Moving from lying on back to sitting on the side of the bed? 1   Moving to and from a bed to a chair? 3   Need to walk in hospital room? 3   Climbing 3 - 5 steps with a railing? 1   Total Raw Score 10   Standardized Score 32.29   CMS 1-100% Score 77   Assessment   Brief Assessment Appropriate for skilled therapy   Problem List Impaired functional mobility   Patient / Family Goal get better   Plan/Recommendation   Treatment Interventions Restorative PT   PT Frequency 3-5x/wk   Mobility Recommendations 1A with RW   Discharge Recommendations Anticipate return to prior living arrangement;Intermittent supervision/assist;Home PT   PT Discharge Equipment Recommended Rolling Walker   Assessment/Recommendations Reviewed With: Patient;Nursing   Next PT Visit progress transfers, ambulation weaning AD as able; stair assessment    Time Calculation   PT Timed Codes 11   PT Untimed Codes 28   PT Unbilled Time 0   PT Total Treatment 39   Plan and Onset date   Plan of Care Date 04/01/17   Treatment Start Date 04/01/17   PT Charges   $PT [facility] Charges Acute PT Eval Low Complexity - code 6378;Functional Training - code [phone] (85min)     Deshante Cassell A. Philis Nettle, PT, DPT, [place]  Pager [phone]

## 2017-04-01 NOTE — Progress Notes (Addendum)
SICU Progress Note    Amanda Galloway a 72 y.o.femalewith PMHx significant for pancreatic adenocarcinoma now s/p open Whipple on 03/29/17, also with depression, Fibromyalgia, and hypothyroidism. She had an open whipple done 1/25, which was uncomplicated. 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    Interval History: 1L given overnight for low urine output    Past Med/Sx History:   Past Medical History:   Diagnosis Date    Acute kidney failure 03/31/2017    Cancer     Depression     Fibromyalgia     Hypothyroidism        Vital Signs:  BP 118/67    Pulse 80    Temp 35.9 C (96.6 F) (Temporal)    Resp 21    Ht 1.702 m (5\' 7" )    Wt 72 kg (158 lb 11.7 oz)    SpO2 96%    BMI 24.86 kg/m     Physical Exam by Systems:  Constitutional: NAD, sitting up in bed  Ears, Nose, Mouth, and Throat: MMM  Cardiovascular: RRR, sinus  Respiratory: clear anteriorly  Gastrointestinal: Abd soft, distended, incision clean and intact, some ecchymosis around incision, right JP with s/s drainage  Genitourinary: foley in place  Musculoskeletal: MAE  Skin: WWP  Neurologic: following commands, alert and oriented  Psychiatric: calm and cooperative    Assessment and Plan for Active/Followed Hospital Problems:   Active Hospital Problems    Diagnosis    *!*Pancreatic adenocarcinoma s/p Whipple 03/29/17     --S/p open Whipple on 1/25 with Dr. Ron Agee  --Duramorph spinal during case  --dPCA for post op pain  --Tylenol po  --Toradol 15mg  q6h for 3 days scheduled  --Lyrica 150mg  QHS  - methadone restarted 1/26  --LR 84cc/h while NPO  --ISS  --NG tube removed 1/26  - clears       hx of Pulmonary embolism     - PE 01/18/17 on CT chest   - on therapeutic lovenox at home  - will discuss with primary team when appropriate to resume       Acute blood loss anemia     - On admission to SICU H/H 10.2/34 ([facility] Hct 30-40s)  - HCT this  morning 20 (23)      Cancer associated pain     - Methadone at home--  - dPCA for post op pain 0.3mg  q38min prn  - Tylenol 1g q6h scheduled  - Toradol 15mg  q6h for 3 days scheduled  - Lyrica 150mg  QHS  - methadone home dose      Acute kidney insufficiency     - Creatinine improving   - 1L bolus overnight for low UOP with good response  - bolus fluid for uop < 30cc/hr      Hypothyroidism     --on synthroid  --will restart in the morning       ---Critical Care Bundle---    Fluids & Electrolytes: LR@84   Nutrition: clears   Mobility Plan/Ability: OOB 2 max asssit   Respiratory weaning/pulmonary toilet: room air / 1L NC  Neuro/sedation/pain: RTN Lyrica, IV Tylenol, IV Toradol, methadone  PCA dilaudid.  Delirium: No  Sleep Hygiene: Yes  List of antimicrobials: none   Drains/Lines/Tubes: JP, PIVx2  Foley: Yes  DVT prophylaxis/aPTT: IPC, SQ heparin   Indication for gastric acid suppression: IV Protonix  Date of last BM: : Prior to admission  Type and frequency of Labs: Daily: CMP, Mag, Phos, CBC, BG Q4H, HCT, BMP at noon once  Family Concerns and Updates: Daughter and Son at bedside 1/26  Patient prefers to be called (AKA): Amanda Galloway   Code Status: Full  Disposition: PCU    Plan and orders for day reiterated by  nursing? Yes    Author: Drue Dun, NP  as of: 04/01/2017  at: 6:31 AM     This patient was evaluated on rounds with the NP Earl Lites. I agree with the database, including PMH/SH/FH/ROS/meds, laboratory results, and radiographic results as documented in the NP note. My additional history, physical exam findings, assessment and plan of care are documented below.    S/P whiplle treated for chronic pain.  Patient given 1 L IVF for reduced urine output.    Patient alert with intermittent discomfort  Blood pressure 123/62, pulse 81, temperature 37 C (98.6 F), temperature source Temporal, resp. rate 13, height 1.702 m (5\' 7" ), weight 72 kg (158 lb 11.7 oz), SpO2 92 %.  lungs clear heart reg abd s/p surgery, well  healing ext with 2 plus edema    Data: HCT 20      IMP:  S/P Whipple for pancreatic cancer.  HCT low but relatively stable after surgery.  Bolused with IVF for low urine output overnight.  Pain controlled with PCA.      Plan: 1) Methadone and PCA   2) One unit PRBC   3) Serial HCTs (BID)   4) Follow urine output   5) Clear liquids, may d/c IVF   6) PT consult   7) Wean o2   8) Needs foley to monitor urine output closely   9) Will need LMWH resumed soon    Will discuss trx to floor with surgical service.    Amanda Bali, MD

## 2017-04-02 LAB — HCT AND HGB
Hematocrit: 27 % — ABNORMAL LOW (ref 34–45)
Hemoglobin: 8.3 g/dL — ABNORMAL LOW (ref 11.2–15.7)

## 2017-04-02 LAB — CBC
Hematocrit: 25 % — ABNORMAL LOW (ref 34–45)
Hemoglobin: 7.8 g/dL — ABNORMAL LOW (ref 11.2–15.7)
MCH: 30 pg/cell (ref 26–32)
MCHC: 31 g/dL — ABNORMAL LOW (ref 32–36)
MCV: 97 fL — ABNORMAL HIGH (ref 79–95)
Platelets: 256 10*3/uL (ref 160–370)
RBC: 2.6 MIL/uL — ABNORMAL LOW (ref 3.9–5.2)
RDW: 21.9 % — ABNORMAL HIGH (ref 11.7–14.4)
WBC: 9.2 10*3/uL (ref 4.0–10.0)

## 2017-04-02 LAB — COMPREHENSIVE METABOLIC PANEL
ALT: 106 U/L — ABNORMAL HIGH (ref 0–35)
AST: 94 U/L — ABNORMAL HIGH (ref 0–35)
Albumin: 3.7 g/dL (ref 3.5–5.2)
Alk Phos: 168 U/L — ABNORMAL HIGH (ref 35–105)
Anion Gap: 10 (ref 7–16)
Bilirubin,Total: 0.3 mg/dL (ref 0.0–1.2)
CO2: 29 mmol/L — ABNORMAL HIGH (ref 20–28)
Calcium: 8.6 mg/dL (ref 8.6–10.2)
Chloride: 99 mmol/L (ref 96–108)
Creatinine: 0.88 mg/dL (ref 0.51–0.95)
GFR,Black: 77 *
GFR,Caucasian: 66 *
Glucose: 131 mg/dL — ABNORMAL HIGH (ref 60–99)
Lab: 18 mg/dL (ref 6–20)
Potassium: 4.5 mmol/L (ref 3.3–5.1)
Sodium: 138 mmol/L (ref 133–145)
Total Protein: 5.4 g/dL — ABNORMAL LOW (ref 6.3–7.7)

## 2017-04-02 LAB — POCT GLUCOSE
Glucose POCT: 90 mg/dL (ref 60–99)
Glucose POCT: 80 mg/dL (ref 60–99)
Glucose POCT: 90 mg/dL (ref 60–99)
Glucose POCT: 89 mg/dL (ref 60–99)

## 2017-04-02 LAB — ANAEROBIC CULTURE: Anaerobic Culture: 0

## 2017-04-02 LAB — RED BLOOD CELLS
Coded Blood type: 600
Component blood type: A NEG
Dispense status: TRANSFUSED

## 2017-04-02 LAB — MCHC: MCHC: 31 g/dL — ABNORMAL LOW (ref 32–36)

## 2017-04-02 LAB — MAGNESIUM: Magnesium: 2.2 mg/dL (ref 1.6–2.5)

## 2017-04-02 LAB — PHOSPHORUS: Phosphorus: 2.8 mg/dL (ref 2.7–4.5)

## 2017-04-02 MED ORDER — PROMETHAZINE HCL 25 MG/ML IJ SOLN *I*
12.5000 mg | Freq: Four times a day (QID) | INTRAMUSCULAR | Status: DC | PRN
Start: 2017-04-02 — End: 2017-04-03
  Administered 2017-04-02: 12.5 mg via INTRAVENOUS
  Filled 2017-04-02: qty 1

## 2017-04-02 MED ORDER — HYDROMORPHONE HCL 4 MG PO TABS *I*
4.0000 mg | ORAL_TABLET | ORAL | Status: DC | PRN
Start: 2017-04-02 — End: 2017-04-03
  Administered 2017-04-02 (×2): 4 mg via ORAL
  Filled 2017-04-02 (×2): qty 1

## 2017-04-02 NOTE — Progress Notes (Signed)
VSS, OOB to chair, ambulated 200 feet around the unit using a walker.  Voiding without any issues.  Minimal PO intake, vomited 250 cc of undigested food.  Will continue to monitor patient.Dortha Schwalbe, RN

## 2017-04-02 NOTE — Consults (Signed)
Medical Nutrition Therapy - Whipple Surgery Diet Ed    Visited pt today to discuss Whipple Surgery Nutrition Therapy which limits foods high in sugar, fat and fiber to minimize malabsorption. Encouraged pt to choose small, frequent meals with a focus on high protein foods. Recommended high fluid intake apart from meals d/t concerns for early satiety.  Also reviewed potential issues as a result of surgery and diet changes that can help to remedy these issues.  Handout also includes complete list of recommended foods and foods to avoid with a 1-day sample menu. Pt verbalized some understanding, however she was noted with some mental status changes earlier. Encouraged her to drink the [employer]. Provided writer's contact information and encouraged to f/u with any questions/concerns, will return for repeat education as needed.      Laurence Aly, [place] [facility] [place]  Pager [phone]

## 2017-04-02 NOTE — Progress Notes (Signed)
Pt has remains confused.  Last evening pt got a little panicked, reported she felt she could breathe. Staff checked O2 and found it to be in low 80's and placed on 4LNC  Since that time pt has been successfully weaned down to [facility] and maintaining sats in the high 90's. Overnight provider notified.  Pt on bed alarm for safety, activated twice overnight as pt was trying to get out of bed.  Pt has moments of meaningful conversation and then other times where she makes no sense, thinks things happened that didn't, etc.  Pt needed a lot of reassurance that she would get better and her mind would clear up in time and was likely due (in part) to narcotics she is on.  Because of this provider decreased frequency of PCA boluses.  If pt tolerates diet today she hopes to try PO pain medicine in hopes of decreasing her confusion.  Daughter at bedside last evening (former [facility]) and reported pt has had post-op confusion in the past but never this bad.

## 2017-04-02 NOTE — Progress Notes (Signed)
LOC in patient as of 03/29/2017

## 2017-04-02 NOTE — Interdisciplinary Rounds (Addendum)
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     Priority: Medium     - Methadone at home--  - dPCA for post op pain 0.3mg  q51min prn  - Tylenol 1g q6h scheduled  - Toradol 15mg  q6h for 3 days scheduled  - Lyrica 150mg  QHS  - methadone home dose      Acute kidney insufficiency 03/31/2017     Priority: Low     Class: Acute     - Creatinine improving   - 1L bolus overnight for low UOP with good response  - bolus fluid for uop < 30cc/hr      Anemia 03/30/2017     Priority: Low     Class: Acute     - hct 31  - monitor  - JP s/s      Hypothyroidism 03/29/2017     Priority: Low     - on synthroid      Pancreatic cancer 03/29/2017    Malignant neoplasm of head of pancreas 10/04/2016     The patient's problem list and interdisciplinary care plan was reviewed.  Discharge Planning   *Does patient currently have home care services?: No  *Current External  Services: None     Plan: s/p open Whipple on 03/29/17. Went to [address] post op, transferred to [facility] yesterday. Very confused, on bed alarm. Will need home PT. Lives with daughter who is a Marine scientist. Hx of chronic pain, on PCA now. Has leaky JP site. English for home nursing.    1/30 Dietician saw patient yesterday to review diet teaching. Emesis yesterday. Patient with desat overnight, CXR. May diurese. Plan for JP removal later today. Patient's daughter works for [employer] and requests that her home nursing care be switched to [employer].    1/31 Mentation improved but pt not wanting to walk, just up to chair yesterday. Discouraged.    2/1 - NG to LIWS, NPO. Choiced to [facility] - will need home PT.     2/2 continued NGT to LIWS, palliative following for pain control.     2/3- continues NPO with NG to LIWS - will likely start  TPN in next 1-2 days.  Changed IVF to NS as Na was 131. Continues on Dilaudid PCA and methadone for pain control per palliative care.    2/4 Still with high NG output output. JP removed. No home equipment needs for PT. PT needs 1-2 more visits to clear for home.    2/5 Starting TPN through mediport. UGI possible in a few days. Still has NG.    2/7 UGI tomorrow if NGT output remains high today.    2/8 Waiting on a plan from the team. Palliative Care has signed off. Still NPO/TPN/NG.    2/9 No acute issues, continue current care, NPO NGT to LIWS.     2/10 Still with abdominal discomfort. GI recommends EGD tomorrow.     2/11 protonix gtt started yesterday evening. Continues with NPO/PCA/TPN/NG to LIWS. Plan for EGD today or tomorrow. HCT 21 overnight - recheck HCT 22.     2/24 Transferred to [facility] 2/23 from [id number]. + flatus, +BM (s/p enema last night) - regular diet (not eating much) - continues on TPN. Palliative care following for pain management. PT recommending SNF rehab for discharge.     2/25 KUB this am with stool and contrast-filled colon. Will need IV Caspo through  3/2.    2/26 Will discuss need for CT chest given known effusion and new report of pain at left lower chest/LUQ. IVAB. Reg diet with TPN. Pt very confused. Incontinent. Deconditioned.    2/27 - Pt very withdrawn - not wanting to eat, not wanting to get up OOB. On regular diet with TPN. Pt deconditioned. SNF rehab per PT for discharge     2/28 No acute events overnight. Methylnaltrexone started yesterday for constipation, 2x BMs recorded +flatus. Tolerating some PO - only taking small amount of ensure, states she feels full.  TPN continuing.    3/1  CT abdomen/pelvis today. Having abd pain with eating. Antibiotics ending tomorrow.    3/2 Febrile overnight, tachypnic and lethargic. Blood cultures and urine cultures. LUQ perc drain placed yesterday with purulent drainage. Small amount of oral intake. Continue on IV Abx.     3/3 Transferred to PCU status after went to IR for drain placement.     3/4  ID following for reaccumulation of a larger abscess. IVAB changed. Hct 21. VRE bacteremia.    3/5 Hct 20 with PRBC's to be given this am. Palliative Care following for pain management. IVAB. SNF plan with SW aware.    3/6 Hct 25, IR for perc drain placement, low grade fever overnight.    3/7 IR drain placed in anterior abd in fluid collection abutting left hepatic lobe. Has capped bili drain and 3 perc drains now. TPN and Reg diet with calorie counts.    3/8 Very weak, refused to get OOB yesterday. Otherwise, no changes.    3/9 No acute changes, continue current care.     3/10 Increasing pain overnight, requiring increase dose of methadone, utilizing PRN IV dilaudid.    3/11 Possible DHT placement today if still having poor po intake per calorie count.    3/12 ID recs ertapenem, daptomycin, caspofungin - recommend 2 weeks from 05/08/17 (end date 05/22/17).  Possible DHT placement soon     3/13 One perc drain removed yesterday. Dobhoff placed yesterday. Plan to start trickle TF. If tolerates will need PEG prior to SNF  discharge.    3/14 TF continue. Needs a lot of reassurance and encouragement, easily emotional. Still on 3 IVAB's daily.  3/15 D/C TPN today once TFs to goal. Walked with assist into bathroom yesterday. IVAB until 3/20.    3/16 DHT out 20 cm this morning, adjusted and X-ray obtained. PT eval yesterday, 1 assist with RW.     3/17 TF at goal, eating small amounts of food, needs encouragement/emotional support daily.     3/18 Continue TF, minimal PO intake. Midline drain removed yesterday. Still has one open drain and one capped bili tube.    3/19 ID continuing to follow along, Continue IV daptomycin and ertapenem. Switching caspofungin to oral fluconazole. Still minimal PO intake. May consider removing last perc drain today vs. Tomorrow. Aiming for discharge next week.    8/84 cyclic TF for 16 hours, IV abx until 3/27 (Daptomyocin and Ertapenem), IV Caspo switch to PO fluconazole. Bili drain clamped, perc drain removed yesterday.      3/21 Remains SNF plan. No changes.    3/22 CT w/ contrast A/P per ID recs for abscess monitoring and antibiotic duration     3/23 CT scan with intra abd collections new portal venous collection, patient remains afebrile, WBC's normal, no plan to drain at this time.     3/24 Still with decreased oral intake. Witnessed fall this morning in bathroom. Was standing at walker and fell backwards onto toilet. Minor abrasion on arm.     3/25 TFs to goal - now cycled at 12 hours. Minimal intake with reg diet and ensures. Now smiling and more interactive than previous weeks.     3/26 Barium given yesterday for PEG tube placement today. Getting KUB to evaluate progression of barium. IJ removed yesterday.    3/27 Old bili drain site started leaking and was pouched. Unable to place PEG yesterday.    3/28 Hct 22. Mostly A & O x 3, rare confusion. Starting to ambulate short distances. Starting to eat a little better.    3/29 Changed to oral antibiotics. Unable to place PEG. No SNF rehab will take a  DHT (many smaller rehabs don't like taking TF at all) but  SW is willing to submit packet so team can see that all are turning it down. That will waste time sitting in the hospital though.    3/30 WBC stable. Tolerating TF. Voiding and + BMs.    3/31 ambulating more, taking in some PO (regular diet), tolerating TF. Continue with current plan of care.    4/1 Team still needs a plan as Rehab facilities will not take [facility]. Pt complaining of abdominal pain and fullness limiting PO but is improving.    4/2  PO intake decreased from yesterday 250 cc (520). Pt continues to complain of abdominal pain and fullness limiting PO. Dispo plan in progress.  512 came to see pt and determined that since the feeding tube is anticipated to be needed longer than two weeks, then the next step would be SNF rehab which cannot accommodate Dobbhoff tube. It is not felt hat pt will improve enough in 1-2 weeks to be able to return home with pts daughter. Therefore pt is not a candidate for 512 at this time.    4/3 Attending will communicate with 5-12 about DH tube issue.     4/4 Showing signs of possible sepsis with pt confused, weak, tachy last night. Full work up done with CT, labs, CXR, cultures.    4/5 IR L pleural pigtail cancelled yest. Plan for procedure this AM. Started on IVAB yesterday with ID following. New liver abscess and  stable prior abdominal abscesses.    4/7 - chest tube removed at bedside - post removal x-ray pending - continues on IV abx per ID (ertapenem/daptomycin)  - may need PICC for long term abx - SNF plan for discharge - SW involved.     4/8 IR pigtail removed yesterday, no evidence of PTX on repeat CXR. Mediport cultures grew VRE yesterday, stopped accessing the port and placed peripheral line for delivery of antibiotics. Plan to repeat CT Scan this week to reassess collections.    4/9 Aiming for PEG tube placement in IR on Thursday. Mediport removed bedside last night. Repeat blood cultures drawn. If no growth for  next 3 days, then patient can get PICC line. Started on heparin gtt pending IR procedure.    4/10 Plan for G-tube placement 4/11, will need long term IV Abx per ID, PICC can place line once [facility] are negative- likely 4/11     4/11Recived barium last night. Plan for G-tube and PICC placement today.    4/12 PICC done yesterday. PEG in OR today. Pt getting frustrated, sad and tired.    4/13 G tube placed yesterday, cleared to use. Requiring IV pain medication for pain control.     4/14 Initiated back on TF yesterday, requiring IV dilaudid for increase pain.     4/15 Starting to walk around the unit starting last week. Still has generalized pain issues. Hct 21. Low Na.    4/16 Changed to PCU bed with Q 2 hour VS. CT done yesterday with possible GT perf into small bowel. Awaiting team on plan and final CT read.    4/17 - PCU status -  NPO, IV fluids, IV abx (daptomycin) started on PCA for pain control per palliative care with IV dilaudid for breakthrough pain.     4/18 PRBC for hct 20. 2 perc drains placed in IR yesterday. ID following.    4/19 No new issues.    4/20 TPN initiated yesterday, had Upper GI tube study performed yesterday, showed leak at distal stomach vs jejunal stump. CT performed yesterday showed evidence of gastric perforation controlled by drain, Pain moderately controlled with PCA.     4/21 Increased PCA dose yesterday and started on q 4 hour Ativan yesterday which made patient very lethargic, when awake patient talked with palliative care and stated her pain is better and has allowed her to hit PCA demand dose less. Was unable to get oob to chair with sara lift. Per team may require NGT but on hold for now.     4/22 Continued lethargy, holding methadone and ativan for sedation. Sedation making it difficult for patient to rehab. Will continue to encourage activity when it can be safely completed.    4/23 Heavy 3 person assist with physical therapy therapy to stand and pivot to commode. PT recommending  hoyer/hover out of bed as patient also does not tolerate sara lift.  Patient seen by palliative care yesterday. Making adjustments to methadone and dilaudid dosing.      4/24 No acute changes overnight. Continuing to monitor alertness.    4/25 Patient complaining of increased pain. PCA increased yesterday. No other changes.    4/26 Getting up to chair with staff now, less lethargic.    4/27 PCA dose decrease today. Per note CT scan the other day revealed no evidence of gastric perforation or leakage.     4/28 PCA increased, pain better controlled but still with intermittent pain at PEG site, tolerating CLD, PERC  drain leaking at site, minimal drain output, asking to be taken to the toilet vs bedpan.     4/29 Attempted to wean PCA yesterday unsuccessfully. Continuing PCA. Continuing to ambulate to bathroom. Tolerating clear liquid diet. Plan to trial clamping Gtube today.     4/30 Having some hallucinations yesterday evening, she is aware she is hallucinating and is easily redirected. Continues to have palliative follow along.    5/1 - palliative care following for symptom management  - pt still wishes to remain full code - dysphagia 1 diet - appetite poor - continues on PCA for pain control.  Scheduled Ativan ordered to help patient's anxiety. SNF rehab plan  - [facility] s/p rehab     5/8 Patient was in ICU 5/1-5/7. Transferred back to [facility] on 5/7. While in ICU, patient had a right perc drain placed in intrahepatic abscess. Currently has trickle TF with Full liquid diet, TPN, PCA, Heparin gtt, IVABX. Plan for a drain removal later today.    5/9 Infectious disease following patient. Anticipate IVABX for at least another 2 weeks. One of the perc drains removed yesterday. May stop TPN today. TF with tray. Desaturated overnight and placed on 2L NC.    5/10 CT obtained yesterday shows reduced hepatic collection with drainage catheter, perihepatic collections are increased, perigastric collections increased.  FLD.    5/11 - TF increased to goal of 65cc/hr.  Ambulating with assistance.  Palliative continues to follow.     5/12 Heparin gtt stopped to removed on of the medial hepatic abscess drains today.  TF at goal and taking in minimal PO.     5/13 - 5/14 No big changes in the plan.    5/15 On TF and FLD, getting full though so taking this slowly. TF held awhile yesterday. Ambulating in halls with 2 assist. On IVAB still.    5/16 More interactive and involved in care. Still has moments of "it's all too much" but seems to be wanting to get better. Just had lots of attention with family, friends and staff for her birthday.    5/17 Refusing TFs, CT shoed no new collections, excited about son visiting today from out of town.    5/18 -5/19 Tolerating small amount of regular diet and TFs, endorses decreased appetite.      5/20 Adjusting TF. Stable mild hyponatremia 128. IVAB continue.    5/21 Perc drain removed yesterday with low grade temp, confusion and weakness afterwards. Weaning off PCA.    5/22 Heparin drip stopped 5/20. Remains on PCA, but slowly weaning. TF and FLD. Working to titrate up TF volume. NA 132 (130). Hct 20. Confusion improving.    5/23 Still weaning off PCA. Unit of blood for Hct of 20 yesterday.    5/24 Pain continues to be controlled, now off PCA.   Tolerating TFs at goal, and regular diet.     5/25 Worsening abd pain, confusion, and tachycardia yesterday, CTA chest negative for PE/ CT A/P showed stable hepatic collections, bilateral effusions improved but atelectasis present. Lactate normal. Blood cultures obtained, Will obtain UA to complete infectious work-up.     5/26 E coli growing from blood cultures, started on meropenem yesterday and switched to ertapenem per ID, suspect possible aspiration pneumonia as source. Still confused and more lethargic.     5/27 Unable to obtain sputum culture.  IVABX.  Pt went outside yesterday with family.     5/28  Developed E. Coli bacteremia on 07/26/17 presumed  secondary  to aspiration pneumonia. Continues on IV ertapenem and PO linezolid. Had her first shower in 4 months over the weekend. Loved it.    5/29 G-tube leaking, TF stopped. Chest xray showing atelectasis and L pleural effusion. Liver enzymes rising. US abdomen. Febrile, Tachy, desated to 85% on room air-on 3 L- BC,lactate, CBC, STAT chest x-ray     5/30 Abx broadened yesterday to Zosyn. TF have been on hold for 24 hours as tube needs a re-check. Down now for test.    5/31 PEG tube check was normal. Had a lot of pain issues overnight. Walking in halls with staff and family, slowly gaining strength.    6/1 Endorsing worse cough this morning. ALT AST and TB all trending downwards.      6/2 Feeling okay this morning, ambulated around unit yesterday. Lasix given x2 yesterday.     6/3 1 unit of blood given yesterday for Hct 22, now 25. Feeling better.    6/4 PEG leaking again. Getting CT of abdomen and pelvis today. Family pushing to have PEG tube removed.    6/5 Calorie counts. Dr Ron Agee not wanting PEG out.     6/6 WOCN working on PEG site dermatitis. Palliative Care working on continued pain management. Holding TFs did not significantly increase pt's PO intake or appetite, had emesis. Oral antibiotics.    6/7 Plan for PEG change out today   Addendum: PEG switched out. Desated and sepsis alert called in IR. Blood cultures obtained.     6/8 patient very lethargic after PEG exchange, rapid response called, chest xray, ABG and ammonia done, head CT done, narcan given x2, neuro consult.    6/9 Lethargy slowly resolving. Went outside last evening with daughter. Had increased pain overnight requiring oral dilaudid x2 and x1 IV dilaudid, TF held.     6/10 Venting G-tube PRN for nausea. Restarted TF's.     6/11 Neuro consult from 6/8: they felt likely patient experienced toxic-metabolic encephalopathy. Head CT unrevealing. Over the next couple days her mental status continued to improve. Encephalopathy now resolved. ID d/c'd  Linezolid yesterday and Zosyn to be d/c'd today.    6/12 TF d/ced today, some confusion last evening-resolved. PT still recommending SNF or 24 hr supervision.    6/13 Smaller PEG in place but site is leaky and skin is irritated. Large emesis this am.     6/14 EGD today.    6/15 Nausea and emesis an issues overnight, refusing po meds at times d/t nausea.     6/16 EGD with candidal esophagitis, being treated with IV Caspo per ID. Started on triple therapy for H pylori. Daughter away this weekend emotional at times, but in better spirits with visitors last evening.   PT recommends home plan now with home PT, doing well this morning.     6/17 Pt still requires 1A for transfers, ambulation, and stairs. She continues to present with decreased strength and impaired balance. Pt doing well, anticipate progression to home with intermittent supervision/assist but pt is not safe for discharge today. Anticipate need for 2-4 more PT visits to allow return home. Encourage PO today. May need to resume TF's if continuing to have PO intolerance.     6/18 Po intake improving. Pt has a new lease on life, anticipating going home possibly rather than rehab. Daughter will start work sometime soon so they need to have a safe home plan.    6/19 Continuing to progress well. Is now a home plan w/ home  PT. Encouraging PO intake. Addendum: Aiming for discharge on Saturday. Scripts being sent to  Pharmacy, prior auths in progress. Team wants her to have 1can of 2Cal HN per day. Checking with homecare regarding insurance coverage. Will discuss options with daughter [name] as well.    6/20 Home tomorrow per Dr. Ron Agee. Will need 3x/day nutritional supplement via PEG to optimize protein intake per Dr. Ron Agee. Things like Boost are too expensive, especially since pt doesn't need to taste it. Dietician will need to recommend what's best for pt.  Addendum: Spoke with [name] and she really wants to keep the planned Saturday d/c that's been discussed  this week. They have at least 10 days worth of Ensure at home (from her trays that she never drank while here) and will purchase more as needed. They are both aware she needs TID supplements via PEG in addition to her eating.    6/21 Dietitian made recommendations for home protein supplements via PEG. Requested script from NP so it can be fax'd to [facility] this am to see if it's covered by insurance. PT recommending rollator walker which may be able to be found at [facility]. Addendum: [name] will look at loan closets for rollator, does not want script sent to vendor. [employer] working on TF, otherwise can order from [email or web address]. Discharge address: [address] [place], [place] [address]      Anticipated Discharge Date:     Discharge Disposition: Home with Services

## 2017-04-02 NOTE — Progress Notes (Addendum)
Surgical Oncology/Hepatobiliary Surgery Progress Note     LOS: 4 days     Subjective:    1U PRBC yesterday, good response Hct 24 after PRBC transfusion. 25 this morning.   Some confusion overnight, PCA decreased to 0.5mg  q53min this morning  Reports she is eating ok  Feels confused but alert and oriented     Objective:    Vitals Sign Ranges for Past 24 Hours:  BP: (97-145)/(53-88)   Temp:  [36 C (96.8 F)-36.8 C (98.2 F)]   Temp src: Temporal (01/29 0837)  Heart Rate:  [63-91]   Resp:  [14-21]   SpO2:  [82 %-99 %]       Physical Exam:    General Appearance: alert, no distress  HEENT: face symmetric   Cardiac: RRR  Respiratory: non-labored  Abdomen: soft, TTP, midline incision c/d/i, JP with some serosang leakage around JP site, JP with small amount of serosang output  Genitourinary: no foley  Extremities: warm, well-perfused  Neuro: alert and oriented to place, time, and self    Labs:    CBC:    Recent Labs  Lab 04/02/17  0046 04/01/17  1610 04/01/17  0853 04/01/17  0428 03/31/17  1831 03/31/17  1240 03/31/17  0415   WBC 9.2 8.2 7.7 9.4  --  9.3 10.0   Hemoglobin 7.8* 7.4* 6.2* 6.3* 6.9* 6.7* 7.5*   Hematocrit 25* 24* 21* 20* 23* 22* 24*   Platelets 256 203 201 214  --  248 696       Metabolic Panel:    Recent Labs  Lab 04/02/17  0046 04/01/17  0428 03/31/17  1240 03/31/17  0415 03/30/17  0852 03/30/17  0646  03/30/17  0016 03/29/17  1758   Sodium 138 139 137 137  --   --   --  136 134   Potassium 4.5 4.9 4.3 4.7 5.0 CANCELED  < > CANCELED 4.6   Chloride 99 99 99 99  --   --   --  96 94*   CO2 29* 27 29* 29*  --   --   --  20 21   UN 18 17 18  21*  --   --   --  16 14   Creatinine 0.88 0.85 0.89 1.10*  --   --   --  0.94 0.73   Glucose 131* 130* 128* 139*  --   --   --  165* 209*   Calcium 8.6 8.2* 7.8* 7.7*  --   --   --  8.0* 7.8*   Magnesium 2.2 2.5  --  2.6*  --   --   --  2.4 2.5   Phosphorus 2.8 3.9  --  4.6*  --   --   --  5.7* 4.9*   < > = values in this interval not displayed.       Assessment:    72 y.o.  female with pancreatic cancer POD # 4  status post whipple procedure.    Plan:    -- will resume therapeutic lovenox when able - possibly after repeat H/H tonight if stable   -- Would hold the toradol given low Hct  -- If ongoing pain issues, would consult APS, will begin transitioning to home po regimen with increased breakthrough   -- will remove JP   -- Continue reg diet     Sheryn Bison, MD  04/02/2017     9:24 AM  General Surgery Resident    [facility]  Attending Addendum:   I personally examined the patient, reviewed the notes, and discussed the plan of care with the residents and patient. Agree with detailed resident's note. Please see the note above for details of history, exam, labs, assessment/plan which reflect my input.     Seen and evaluated at 4pm 04/02/17. Note delayed.     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     Mild ileus. Episode of vomiting possibly related to excessive intake. Continue regular diet. May consider reglan.   Hyperglycemia without need for insulin. Monitor.   Post operative anemia. No evidence of bleeding. Dilutional. Previous transfusion single unit pRBC with appropriate increase.   Volume overload with 10 liter positive fluid status.     Diuresis with lasix. Monitor electrolytes.   Hypoalbuminemia. Malnutrition.     Delano Metz, MD, FACS  Hepatobiliary, Pancreas & GI Surgery

## 2017-04-02 NOTE — Plan of Care (Signed)
Problem: Safety  Goal: Patient will remain free of falls  Outcome: Progressing towards goal      Problem: Pain/Comfort  Goal: Patient's pain or discomfort is manageable  Outcome: Progressing towards goal      Problem: Mobility  Goal: Functional status is maintained or improved - Geriatric  Outcome: Progressing towards goal      Problem: Nutrition  Goal: Patient's nutritional status is maintained or improved  Outcome: Progressing towards goal      Problem: Cognitive function  Goal: Cognitive function will be maintained  Outcome: Progressing towards goal      Problem: Bowel Elimination  Goal: Elimination patterns are normal or improving  Outcome: Progressing towards goal      Problem: Post-Operative Hemodynamic Stability  Goal: Maintain Hemodynamic Stability  Outcome: Progressing towards goal      Problem: Post-Operative Complications  Goal: Patient will remain free from symptoms of infection-post op  Outcome: Progressing towards goal      Problem: Post-Operative Bowel Elimination  Goal: Elimination pattern is normal or improving  Outcome: Progressing towards goal

## 2017-04-03 ENCOUNTER — Inpatient Hospital Stay: Payer: Medicare (Managed Care)

## 2017-04-03 ENCOUNTER — Encounter: Payer: Self-pay | Admitting: Anesthesiology

## 2017-04-03 DIAGNOSIS — R110 Nausea: Secondary | ICD-10-CM

## 2017-04-03 DIAGNOSIS — J90 Pleural effusion, not elsewhere classified: Secondary | ICD-10-CM

## 2017-04-03 DIAGNOSIS — K3189 Other diseases of stomach and duodenum: Secondary | ICD-10-CM

## 2017-04-03 DIAGNOSIS — J9811 Atelectasis: Secondary | ICD-10-CM

## 2017-04-03 LAB — COMPREHENSIVE METABOLIC PANEL
ALT: 78 U/L — ABNORMAL HIGH (ref 0–35)
AST: 52 U/L — ABNORMAL HIGH (ref 0–35)
Albumin: 3.4 g/dL — ABNORMAL LOW (ref 3.5–5.2)
Alk Phos: 147 U/L — ABNORMAL HIGH (ref 35–105)
Anion Gap: 13 (ref 7–16)
Bilirubin,Total: 0.2 mg/dL (ref 0.0–1.2)
CO2: 29 mmol/L — ABNORMAL HIGH (ref 20–28)
Calcium: 8.2 mg/dL — ABNORMAL LOW (ref 8.6–10.2)
Chloride: 97 mmol/L (ref 96–108)
Creatinine: 0.9 mg/dL (ref 0.51–0.95)
GFR,Black: 75 *
GFR,Caucasian: 65 *
Glucose: 88 mg/dL (ref 60–99)
Lab: 15 mg/dL (ref 6–20)
Potassium: 3.9 mmol/L (ref 3.3–5.1)
Sodium: 139 mmol/L (ref 133–145)
Total Protein: 5.2 g/dL — ABNORMAL LOW (ref 6.3–7.7)

## 2017-04-03 LAB — PHOSPHORUS: Phosphorus: 3.5 mg/dL (ref 2.7–4.5)

## 2017-04-03 LAB — CBC
Hematocrit: 27 % — ABNORMAL LOW (ref 34–45)
Hemoglobin: 8.6 g/dL — ABNORMAL LOW (ref 11.2–15.7)
MCH: 30 pg/cell (ref 26–32)
MCHC: 31 g/dL — ABNORMAL LOW (ref 32–36)
MCV: 96 fL — ABNORMAL HIGH (ref 79–95)
Platelets: 286 10*3/uL (ref 160–370)
RBC: 2.9 MIL/uL — ABNORMAL LOW (ref 3.9–5.2)
RDW: 20.2 % — ABNORMAL HIGH (ref 11.7–14.4)
WBC: 8.9 10*3/uL (ref 4.0–10.0)

## 2017-04-03 LAB — POCT GLUCOSE
Glucose POCT: 82 mg/dL (ref 60–99)
Glucose POCT: 112 mg/dL — ABNORMAL HIGH (ref 60–99)
Glucose POCT: 95 mg/dL (ref 60–99)
Glucose POCT: 98 mg/dL (ref 60–99)

## 2017-04-03 LAB — MAGNESIUM: Magnesium: 1.9 mg/dL (ref 1.6–2.5)

## 2017-04-03 MED ORDER — IBUPROFEN 200 MG PO TABS *I*
600.0000 mg | ORAL_TABLET | Freq: Four times a day (QID) | ORAL | Status: DC
Start: 2017-04-04 — End: 2017-04-03

## 2017-04-03 MED ORDER — FUROSEMIDE 10 MG/ML IJ SOLN *I*
20.0000 mg | Freq: Once | INTRAMUSCULAR | Status: AC
Start: 2017-04-03 — End: 2017-04-03
  Administered 2017-04-03: 20 mg via INTRAVENOUS
  Filled 2017-04-03: qty 2

## 2017-04-03 MED ORDER — LACTULOSE 20 GM/30ML PO SOLN *WRAPPED*
20.0000 g | Freq: Every day | ORAL | Status: DC
Start: 2017-04-03 — End: 2017-04-04
  Administered 2017-04-03: 20 g via ORAL
  Filled 2017-04-03 (×2): qty 30

## 2017-04-03 MED ORDER — METOCLOPRAMIDE HCL 5 MG PO TABS *I*
5.0000 mg | ORAL_TABLET | Freq: Three times a day (TID) | ORAL | Status: DC
Start: 2017-04-03 — End: 2017-04-05
  Administered 2017-04-03 – 2017-04-04 (×3): 5 mg via ORAL
  Filled 2017-04-03 (×6): qty 1

## 2017-04-03 MED ORDER — BISACODYL 10 MG RE SUPP *I*
10.0000 mg | Freq: Once | RECTAL | Status: AC
Start: 2017-04-03 — End: 2017-04-03
  Administered 2017-04-03: 10 mg via RECTAL

## 2017-04-03 MED ORDER — ENOXAPARIN SODIUM 80 MG/0.8ML IJ SOSY *I*
1.0000 mg/kg | PREFILLED_SYRINGE | Freq: Two times a day (BID) | INTRAMUSCULAR | Status: DC
Start: 2017-04-03 — End: 2017-04-04
  Administered 2017-04-03 – 2017-04-04 (×3): 80 mg via SUBCUTANEOUS
  Filled 2017-04-03 (×5): qty 0.8

## 2017-04-03 MED ORDER — ONDANSETRON HCL 2 MG/ML IV SOLN *I*
4.0000 mg | Freq: Four times a day (QID) | INTRAMUSCULAR | Status: AC | PRN
Start: 2017-04-03 — End: 2017-07-15
  Administered 2017-04-03 – 2017-07-13 (×23): 4 mg via INTRAVENOUS
  Filled 2017-04-03 (×24): qty 2

## 2017-04-03 MED ORDER — HYDROMORPHONE HCL 4 MG PO TABS *I*
4.0000 mg | ORAL_TABLET | ORAL | Status: DC | PRN
Start: 2017-04-03 — End: 2017-04-04
  Administered 2017-04-03 – 2017-04-04 (×3): 4 mg via ORAL
  Filled 2017-04-03 (×3): qty 1

## 2017-04-03 MED ORDER — ENOXAPARIN SODIUM 40 MG/0.4ML IJ SOSY *I*
40.0000 mg | PREFILLED_SYRINGE | Freq: Every day | INTRAMUSCULAR | Status: DC
Start: 2017-04-03 — End: 2017-04-03

## 2017-04-03 MED ORDER — HYDROMORPHONE HCL 2 MG/ML IJ SOLN *WRAPPED*
1.0000 mg | INTRAMUSCULAR | Status: DC | PRN
Start: 2017-04-03 — End: 2017-04-04
  Administered 2017-04-03 – 2017-04-04 (×7): 1 mg via INTRAVENOUS
  Filled 2017-04-03 (×7): qty 1

## 2017-04-03 NOTE — Progress Notes (Signed)
Pt c/o of unbearable pain. PO Dilaudid, scheduled methadone, IV Dilaudid given with minor relief. Good UOP after a dose of lasix. Zofran given for nausea. Pt refused to try any food. BGs intact. Suppository given with no effect. Pt refused to ambulate so far. OOB to chair for several hours. Pt in poor spirits and some encouragement given by Probation officer. Will continue to monitor and assess.

## 2017-04-03 NOTE — Provider Consult (Incomplete)
APS Consult H&P for Inpatients    Consult Requested by: [facility]  Consult Question: postop incisional pain management     Chief Complaint: abdominal incisional pain    History of Present Illness:  HPI   A [name] with PMHx significant for depression, fibromyalgia, hypothyroidism and pancreatic adenocarcinoma s/p chemo and open Whipple on 03/29/2017 was consulted for incisional pain management. She was on methadone for cancer-related pain prior to surgery managed by palliative care at outpatient. She had been on dilaudid PCA on POD 0 - 4 in addition to her home methadone, and overall her pain had been well controlled. Primary team switch dilaudid PCA to oral dilaudid on POD 4 and since then the pain has became challenging to be managed. Of note, she required a high amount of dilaudid PCA (50 - 60 mg in total daily per chart), which is reasonable for acute incisional pain as well as maintenance on methadone. Currently she is on po dilaudid 4 mg q3h prn and iv dilaudid 1 mg q2h prn, tylenol 1 gm tid, lyrica 150 mg qhs and home amtriptyline 25 mg qhs.     ISTOP (Reference #: [id number]) was checked and it is noted that in 01/2017 she required methadone 35 mg in total daily and 02/2017 she was prescribed with 55 mg in total daily (from Dr. Carolin Coy note in January, methadone dose was 20 mg bid in daytime and 15 mg qhs.).     Of note, she has a PE history recently and started on lovenox 80 mg for anticoagulation unfortunately complicated with bleeding issues manifested with trunk ecchymosis. She was on heparin postop and started lovenox 80 mg today.    Past Medical History:   Diagnosis Date    Acute kidney failure 03/31/2017    Cancer     Depression     Fibromyalgia     Hypothyroidism      Past Surgical History:   Procedure Laterality Date    CHOLECYSTECTOMY      CHOLECYSTECTOMY, LAPAROSCOPIC  09/06/2016    HYSTERECTOMY  08/1986    Fibroids    KNEE SURGERY Right     PR INSERT TUNNELED CV CATH WITH PORT Right 10/04/2016     Procedure: Right IJ MEDIPORT Insertion;  Surgeon: Delano Metz, MD;  Location: [facility] MAIN OR;  Service: Oncology General    PR LAP,DIAGNOSTIC ABDOMEN N/A 10/04/2016    Procedure: LAPAROSCOPY DIAGNOSTIC;  Surgeon: Delano Metz, MD;  Location: [facility] MAIN OR;  Service: Oncology General    PR PART [place] PANC,PROX+REMV DUOD+ANAST N/A 03/29/2017    Procedure: WHIPPLE PROCEDURE;  Surgeon: Delano Metz, MD;  Location: [facility] MAIN OR;  Service: Oncology General    ROTATOR CUFF REPAIR Right     TONSILLECTOMY AND ADENOIDECTOMY       Family History   Problem Relation Age of Onset    Breast cancer Mother         died 36    Cancer Father         NHL, died 65    Heart Disease Father     Diabetes Sister     Obesity Sister         4 siblings     Social History     Social History    Marital status: Divorced     Spouse name: N/A    Number of children: N/A    Years of education: N/A     Social History Main Topics    Smoking status:  Never Smoker    Smokeless tobacco: Never Used    Alcohol use No    Drug use: No    Sexual activity: Not Asked     Other Topics Concern    None     Social History Narrative    None       Allergies: No Known Allergies (drug, envir, food or latex)    Prior to Admission Medications:  Prescriptions Prior to Admission   Medication Sig    methadone (DOLOPHINE) 5 MG tablet Take 15 mg by mouth nightly    LORazepam (ATIVAN) 0.5 MG tablet Take 1 tablet (0.5 mg total) by mouth 2 times daily as needed for Anxiety   Max daily dose: 1 mg (Patient taking differently: Take 0.5 mg by mouth 2 times daily as needed for Anxiety   0.5mg  nightly and prn)    methadone (DOLOPHINE) 5 MG tablet Take 20mg  in the morning, 20mg  in afternoon and 15mg  at night. (Patient taking differently: 10 mg 2 times daily   Take 20mg  in the morning, 20mg  in afternoon and 15mg  at night.)    enoxaparin (LOVENOX) 100 mg/mL injection Inject 1 Syringe (100 mg total) into the skin daily (Patient taking differently: Inject 100 mg into the skin 2 times  daily )    potassium chloride SA (KLOR-CON M20) 20 mEq  tablet Take 20 mEq by mouth 2 times daily    famotidine (PEPCID) 20 MG tablet Take 1 tablet (20 mg total) by mouth 2 times daily    escitalopram (LEXAPRO) 20 MG tablet Take 1 tablet (20 mg total) by mouth daily (Patient taking differently: Take 20 mg by mouth nightly   )    amitriptyline (ELAVIL) 25 MG tablet Take 1 tablet (25 mg total) by mouth nightly    dexamethasone (DECADRON) 4 MG tablet Take 1 tablet (4 mg total) by mouth 2 times daily (Patient taking differently: Take 4 mg by mouth daily (with breakfast)   )    docusate sodium (COLACE) 100 MG capsule Take 2 capsules (200 mg total) by mouth 2 times daily    metoclopramide (REGLAN) 5 MG tablet Take 1 tablet (5 mg total) by mouth 3 times daily (before meals)    pantoprazole (PROTONIX) 40 MG EC tablet Take 1 tablet (40 mg total) by mouth 2 times daily (before meals)   Swallow whole. Do not crush, break, or chew.    senna (SENOKOT) 8.6 MG tablet Take 2 tablets by mouth 2 times daily    PREMARIN 0.625 MG tablet Take 0.625 mg by mouth every morning       ondansetron (ZOFRAN-ODT) 4 MG disintegrating tablet Take 1 tablet (4 mg total) by mouth 3 times daily as needed for Nausea   Place on top of tongue.    levothyroxine (SYNTHROID, LEVOTHROID) 137 MCG tablet Take 1 tablet (137 mcg total) by mouth daily (before breakfast)    Vitamins - senior (CENTRUM SILVER) TABS Take 1 tablet by mouth daily (Patient taking differently: Take 1 tablet by mouth every morning   )    triamterene-hydrochlorothiazide (MAXZIDE-25) 37.5-25 MG per tablet Take 1 tablet by mouth daily (Patient taking differently: Take 1 tablet by mouth every morning   )    fluorouracil (ADRUCIL) 50 MG/ML injection Administer 3,477 mg into the vein every 14 days   Start in infusion center.  Infuse over 46 hours at home.    pegfilgrastim (NEULASTA) 6 MG/0.6ML injection syringe Inject 0.6 mLs (6 mg total) into the skin once  Meds Exist in  Therapy Plan Peg-Filgrastim    heparin 100 UNIT/ML injection 5 mLs (500 Units total) by Intracatheter route as needed (for line care)    sodium chloride 0.9 % flush 10 mLs by Intracatheter route as needed (for line care)       Active Hospital Medications:  Current Facility-Administered Medications   Medication Dose Route Frequency    HYDROmorphone (DILAUDID) tablet 4 mg  4 mg Oral Q3H PRN    HYDROmorphone (DILAUDID) injection 1 mg  1 mg Intravenous Q2H PRN    enoxaparin (LOVENOX) injection 80 mg  1 mg/kg Subcutaneous Q12H    metoclopramide (REGLAN) tablet 5 mg  5 mg Oral TID AC    lactulose (CHRONULAC) 20 GM/30ML solution 20 g  20 g Oral Daily    ondansetron (ZOFRAN) injection 4 mg  4 mg Intravenous Q8H PRN    pantoprazole (PROTONIX) EC tablet 40 mg  40 mg Oral QAM    dexamethasone (DECADRON INTENSOL) 1 mg/mL concentrated solution 4 mg  4 mg Per NG tube Daily    acetaminophen (TYLENOL) tablet 1,000 mg  1,000 mg Oral Q8H    insulin lispro (HumaLOG,ADMELOG) injection 0-12 Units  0-12 Units Subcutaneous 4x Daily AC & HS    senna (SENOKOT) tablet 2 tablet  2 tablet Oral 2 times per day    docusate sodium (COLACE) capsule 200 mg  200 mg Oral 2 times per day    levothyroxine (SYNTHROID, LEVOTHROID) tablet 137 mcg  137 mcg Oral Daily    methadone (DOLOPHINE) tablet 10 mg  10 mg Oral QAM    And    methadone (DOLOPHINE) tablet 15 mg  15 mg Oral Daily with lunch    And    methadone (DOLOPHINE) tablet 10 mg  10 mg Oral Nightly    amitriptyline (ELAVIL) tablet 25 mg  25 mg Oral Nightly    escitalopram (LEXAPRO) tablet 20 mg  20 mg Oral Nightly    naloxone (NARCAN) 0.4 mg/mL injection 0.1 mg  0.1 mg Intravenous Q5 Min PRN    pregabalin (LYRICA) capsule 150 mg  150 mg Oral Nightly    phenol (CHLORASEPTIC) 1.4 % liquid LIQD 2 spray  2 spray Oral Q2H PRN       Review of Systems:  Review of Systems   Gastrointestinal: Positive for nausea.       Last Nursing documented pain:  0-10 Scale: 8 (04/03/17  1313)      Patient Vitals for the past 24 hrs:   BP Temp Temp src Pulse Resp SpO2   04/03/17 1245 (P) 140/90 (P) 36 C (96.8 F) (P) TEMPORAL (P) 87 (P) 18 (P) 95 %   04/03/17 0921 - - - - - 94 %   04/03/17 0800 136/72 36.6 C (97.9 F) TEMPORAL 75 14 93 %   04/03/17 0453 - - - - 14 -   04/03/17 0352 122/80 36.3 C (97.3 F) TEMPORAL 71 14 96 %   04/03/17 0121 - - - - 14 -   04/03/17 0101 120/80 35.8 C (96.4 F) TEMPORAL 75 14 97 %   04/02/17 2148 - - - - 12 97 %   04/02/17 2019 110/60 35.9 C (96.6 F) TEMPORAL 73 16 96 %   04/02/17 1633 - - - - - 91 %   04/02/17 1625 120/70 36.1 C (97 F) TEMPORAL 79 16 (!) 82 %     O2 Flow Rate: 1 L/min (04/03/17 0800)     Physical Examination:  Physical Exam      Lab Results: none    Radiology impressions (last 3 days):  *chest Standard Single View    Result Date: 04/03/2017  Small bilateral pleural effusions and bilateral atelectasis. END OF IMPRESSION I have personally reviewed the images and the Resident's/Fellow's interpretation and agree with or edited the findings. [facility] submits this DICOM format image data and final report to the [facility], an independent secure electronic health information exchange, on a reciprocally searchable basis (with patient authorization) for a minimum of 12 months after exam date.        Currently Active/Followed Hospital Problems:  Active Hospital Problems    Diagnosis    *!*Pancreatic adenocarcinoma s/p Whipple 03/29/17     --S/p open Whipple on 1/25 with Dr. Ron Agee  --Duramorph spinal during case  --dPCA for post op pain  --Tylenol po  --Toradol 15mg  q6h for 3 days scheduled  --Lyrica 150mg  QHS  - methadone restarted 1/26  --LR 84cc/h while NPO  --ISS  --NG tube removed 1/26  - clears      Acute kidney insufficiency     - Creatinine improving   - 1L bolus overnight for low UOP with good response  - bolus fluid for uop < 30cc/hr       hx of Pulmonary embolism     - PE 01/18/17 on CT chest   - on therapeutic lovenox at home  - will  discuss with primary team when appropriate to resume       Acute blood loss anemia     - On admission to SICU H/H 10.2/34 (Basline Hct 30-40s)  - HCT this morning 20 (23)      Hypothyroidism     - on synthroid      Cancer associated pain     - Methadone at home--  - dPCA for post op pain 0.3mg  q53min prn  - Tylenol 1g q6h scheduled  - Toradol 15mg  q6h for 3 days scheduled  - Lyrica 150mg  QHS  - methadone home dose         Assessment:   A [name] with hx of depression, fibromyalgia, cancer-related pain on methadone, PE on lovenox complicated with subcutaneous bleeding presented with pancreatic cancer s/p Whipple procedure on 03/29/2017. APS was consulted for management of incisional abdominal pain. Her pain was well tolerated on dPCA on POD 0 - 4 and progressively increasing on POD 4 -5 when the dPCA was switched to po dilaudid. It is noted that she was on high dose of dPCA daily dose ranging from 50 - 60 mg as well as on methadone 35 mg daily (10 + 15 +10). It is challenging to calculate the opioid requirement for patient on methadone since higher dose of methadone correlates with higher potency and has long duration of action. It would be reasonable to restart dPCA to better manage the pain while being able to estimate equianalgesic dose of oral medications.    Plan: ***  Level of Service    Author: Isaias Sakai, MD  Note created: 04/03/2017  at: 1:49 PM

## 2017-04-03 NOTE — Plan of Care (Signed)
Nutrition     Patient's nutritional status is maintained or improved Maintaining          Bowel Elimination     Elimination patterns are normal or improving Progressing towards goal        Cognitive function     Cognitive function will be maintained Progressing towards goal        Mobility     Functional status is maintained or improved - Geriatric Progressing towards goal        Pain/Comfort     Patient's pain or discomfort is manageable Progressing towards goal        Post-Operative Bowel Elimination     Elimination pattern is normal or improving Progressing towards goal        Post-Operative Complications     Patient will remain free from symptoms of infection-post op Progressing towards goal        Post-Operative Hemodynamic Stability     Maintain Hemodynamic Stability Progressing towards goal        Safety     Patient will remain free of falls Progressing towards goal

## 2017-04-03 NOTE — Progress Notes (Addendum)
Surgical Oncology/Hepatobiliary Surgery Progress Note     LOS: 5 days     Subjective:    Desaturation to 82% overnight, responded to 1L NC, satting well on 2L afterwards overnight  Reports taking deep breaths is painful in her abdomen, did not receive PRN pain med overnight  Tolerating small volume PO, no N/V after breakfast yesterday    JP 12.45mL  Objective:    Vitals Sign Ranges for Past 24 Hours:  BP: (110-132)/(60-88)   Temp:  [35.8 C (96.4 F)-36.8 C (98.2 F)]   Temp src: Temporal (01/30 0352)  Heart Rate:  [63-79]   Resp:  [12-16]   SpO2:  [82 %-97 %]       Physical Exam:    General Appearance: alert, no distress  HEENT: face symmetric, mildly edemetous  Cardiac: RRR  Respiratory: non-labored  Abdomen: soft, TTP, midline incision c/d/i, JP with small amount of serosang output  Genitourinary: no foley  Extremities: warm, well-perfused, 1+ edema bilaterally  Neuro: alert and oriented to place, time, and self    Labs:    CBC:    Recent Labs  Lab 04/03/17  0105 04/02/17  1211 04/02/17  0046 04/01/17  1610 04/01/17  0853 04/01/17  0428  03/31/17  1240   WBC 8.9  --  9.2 8.2 7.7 9.4  --  9.3   Hemoglobin 8.6* 8.3* 7.8* 7.4* 6.2* 6.3*  < > 6.7*   Hematocrit 27* 27* 25* 24* 21* 20*  < > 22*   Platelets 286  --  256 203 201 214  --  248   < > = values in this interval not displayed.    Metabolic Panel:    Recent Labs  Lab 04/03/17  0105 04/02/17  0046 04/01/17  0428 03/31/17  1240 03/31/17  0415 03/30/17  0852  03/30/17  0016 03/29/17  1758   Sodium 139 138 139 137 137  --   --  136 134   Potassium 3.9 4.5 4.9 4.3 4.7 5.0  < > CANCELED 4.6   Chloride 97 99 99 99 99  --   --  96 94*   CO2 29* 29* 27 29* 29*  --   --  20 21   UN 15 18 17 18  21*  --   --  16 14   Creatinine 0.90 0.88 0.85 0.89 1.10*  --   --  0.94 0.73   Glucose 88 131* 130* 128* 139*  --   --  165* 209*   Calcium 8.2* 8.6 8.2* 7.8* 7.7*  --   --  8.0* 7.8*   Magnesium 1.9 2.2 2.5  --  2.6*  --   --  2.4 2.5   Phosphorus 3.5 2.8 3.9  --  4.6*  --   --   5.7* 4.9*   < > = values in this interval not displayed.       Assessment:    72 y.o. female with pancreatic cancer POD # 5  status post whipple procedure.    Plan:    -- will resume therapeutic lovenox this morning  -- Chest Xray this morning, will diurese accordingly  -- Analgesia with methadone 10mg  breakfast, 15mg  lunch, 10mg  dinner. Dilaudid 4mg  PO for prn, can convert to IV if not tolerating PO  -- If ongoing pain issues, will consult APS   -- will remove JP   -- Continue reg diet     Riki Altes, DO  04/03/2017  6:51 AM  General Surgery Resident    [facility]-GI Attending Addendum:   I personally examined the patient, reviewed the notes, and discussed the plan of care with the residents and patient. Agree with detailed resident's note. Please see the note above for details of history, exam, labs, assessment/plan which reflect my input.     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     Pulmonary insufficiency with need for oxygen supplementation after desaturation. CXR performed. Diuresis. Oxygen supplementation. Incentive spirometry.   Mild ileus.   Hyperglycemia without need for insulin. Monitor.   Post operative anemia. No evidence of bleeding. Dilutional. Previous transfusion single unit pRBC with appropriate increase.   Volume overload with 10 liter positive fluid status. Diuresis with lasix. Monitor electrolytes.   Hypoalbuminemia. Malnutrition.       Delano Metz, MD, FACS  Hepatobiliary, Pancreas & GI Surgery

## 2017-04-04 DIAGNOSIS — C250 Malignant neoplasm of head of pancreas: Secondary | ICD-10-CM

## 2017-04-04 LAB — COMPREHENSIVE METABOLIC PANEL
ALT: 61 U/L — ABNORMAL HIGH (ref 0–35)
AST: 42 U/L — ABNORMAL HIGH (ref 0–35)
Albumin: 3.4 g/dL — ABNORMAL LOW (ref 3.5–5.2)
Alk Phos: 142 U/L — ABNORMAL HIGH (ref 35–105)
Anion Gap: 16 (ref 7–16)
Bilirubin,Total: 0.3 mg/dL (ref 0.0–1.2)
CO2: 26 mmol/L (ref 20–28)
Calcium: 8.6 mg/dL (ref 8.6–10.2)
Chloride: 92 mmol/L — ABNORMAL LOW (ref 96–108)
Creatinine: 0.89 mg/dL (ref 0.51–0.95)
GFR,Black: 76 *
GFR,Caucasian: 66 *
Glucose: 100 mg/dL — ABNORMAL HIGH (ref 60–99)
Lab: 14 mg/dL (ref 6–20)
Potassium: 4.2 mmol/L (ref 3.3–5.1)
Sodium: 134 mmol/L (ref 133–145)
Total Protein: 5.4 g/dL — ABNORMAL LOW (ref 6.3–7.7)

## 2017-04-04 LAB — CBC
Hematocrit: 30 % — ABNORMAL LOW (ref 34–45)
Hemoglobin: 9.8 g/dL — ABNORMAL LOW (ref 11.2–15.7)
MCH: 30 pg/cell (ref 26–32)
MCHC: 33 g/dL (ref 32–36)
MCV: 93 fL (ref 79–95)
Platelets: 318 10*3/uL (ref 160–370)
RBC: 3.2 MIL/uL — ABNORMAL LOW (ref 3.9–5.2)
RDW: 18.9 % — ABNORMAL HIGH (ref 11.7–14.4)
WBC: 12.1 10*3/uL — ABNORMAL HIGH (ref 4.0–10.0)

## 2017-04-04 LAB — PHOSPHORUS: Phosphorus: 4.8 mg/dL — ABNORMAL HIGH (ref 2.7–4.5)

## 2017-04-04 LAB — POCT GLUCOSE
Glucose POCT: 103 mg/dL — ABNORMAL HIGH (ref 60–99)
Glucose POCT: 106 mg/dL — ABNORMAL HIGH (ref 60–99)
Glucose POCT: 109 mg/dL — ABNORMAL HIGH (ref 60–99)

## 2017-04-04 LAB — MAGNESIUM: Magnesium: 1.8 mg/dL (ref 1.6–2.5)

## 2017-04-04 MED ORDER — LIDOCAINE HCL 2 % UROJET *I*
10.0000 mL | Freq: Once | CUTANEOUS | Status: AC
Start: 2017-04-04 — End: 2017-04-04
  Administered 2017-04-04: 10 mL via TOPICAL

## 2017-04-04 MED ORDER — LIDOCAINE HCL 2 % UROJET *I*
CUTANEOUS | Status: DC
Start: 2017-04-04 — End: 2017-04-05
  Filled 2017-04-04: qty 10

## 2017-04-04 MED ORDER — PROMETHAZINE HCL 25 MG/ML IJ SOLN *I*
12.5000 mg | Freq: Four times a day (QID) | INTRAMUSCULAR | Status: DC | PRN
Start: 2017-04-04 — End: 2017-04-04

## 2017-04-04 MED ORDER — INSULIN REGULAR HUMAN 100 UNIT/ML IJ SOLN *I*
0.0000 [IU] | Freq: Four times a day (QID) | INTRAMUSCULAR | Status: DC
Start: 2017-04-05 — End: 2017-04-18
  Administered 2017-04-12 – 2017-04-15 (×4): 1 [IU] via SUBCUTANEOUS
  Administered 2017-04-16: 2 [IU] via SUBCUTANEOUS
  Administered 2017-04-16 (×3): 1 [IU] via SUBCUTANEOUS
  Administered 2017-04-17: 3 [IU] via SUBCUTANEOUS
  Administered 2017-04-17 (×2): 2 [IU] via SUBCUTANEOUS

## 2017-04-04 MED ORDER — DEXAMETHASONE 1 MG/ML PO CONCENTRATED *I*
4.0000 mg | Freq: Every day | ORAL | Status: DC
Start: 2017-04-05 — End: 2017-04-22
  Administered 2017-04-05 – 2017-04-21 (×16): 4 mg via ORAL
  Filled 2017-04-04 (×20): qty 4

## 2017-04-04 MED ORDER — ENOXAPARIN SODIUM 40 MG/0.4ML IJ SOSY *I*
40.0000 mg | PREFILLED_SYRINGE | Freq: Every day | INTRAMUSCULAR | Status: DC
Start: 2017-04-04 — End: 2017-04-05
  Administered 2017-04-04: 40 mg via SUBCUTANEOUS
  Filled 2017-04-04: qty 0.4

## 2017-04-04 MED ORDER — LACTULOSE 20 GM/30ML PO SOLN *WRAPPED*
20.0000 g | Freq: Three times a day (TID) | ORAL | Status: AC
Start: 2017-04-04 — End: 2017-04-06
  Administered 2017-04-05 – 2017-04-06 (×4): 20 g via ORAL
  Filled 2017-04-04 (×6): qty 30

## 2017-04-04 MED ORDER — SODIUM CHLORIDE 0.9 % IV SOLN WRAPPED *I*
75.0000 mL/h | Status: DC
Start: 2017-04-04 — End: 2017-04-06
  Administered 2017-04-04 – 2017-04-05 (×4): 75 mL/h via INTRAVENOUS

## 2017-04-04 MED ORDER — INSULIN LISPRO (HUMAN) 100 UNIT/ML IJ/SC SOLN *WRAPPED*
0.0000 [IU] | Freq: Four times a day (QID) | SUBCUTANEOUS | Status: DC
Start: 2017-04-04 — End: 2017-04-04

## 2017-04-04 MED ORDER — PROCHLORPERAZINE EDISYLATE 5 MG/ML IJ SOLN WRAPPED *I*
5.0000 mg | Freq: Four times a day (QID) | INTRAMUSCULAR | Status: DC | PRN
Start: 2017-04-04 — End: 2017-04-04

## 2017-04-04 MED ORDER — PHENOL 1.4 % MT LIQD *I*
2.0000 | OROMUCOSAL | Status: DC | PRN
Start: 2017-04-04 — End: 2017-04-10
  Administered 2017-04-06: 2 via ORAL
  Filled 2017-04-04: qty 20

## 2017-04-04 MED ORDER — PANTOPRAZOLE SODIUM 40 MG IV SOLR *I*
40.0000 mg | INTRAVENOUS | Status: DC
Start: 2017-04-05 — End: 2017-04-14
  Administered 2017-04-05 – 2017-04-14 (×10): 40 mg via INTRAVENOUS
  Filled 2017-04-04 (×11): qty 10

## 2017-04-04 MED ORDER — HYDROMORPHONE PCA 1 MG/ML *WRAPPED*
INTRAMUSCULAR | Status: DC
Start: 2017-04-04 — End: 2017-04-16
  Filled 2017-04-04 (×8): qty 25

## 2017-04-04 MED ORDER — LORAZEPAM 0.5 MG PO TABS *I*
0.2500 mg | ORAL_TABLET | Freq: Four times a day (QID) | ORAL | Status: DC | PRN
Start: 2017-04-04 — End: 2017-04-09
  Administered 2017-04-04 – 2017-04-05 (×2): 0.25 mg via ORAL
  Filled 2017-04-04 (×2): qty 1

## 2017-04-04 NOTE — Progress Notes (Signed)
04/04/17 1200   [facility] Patient Class Review   Patient Class Review Inpatient     Patient Class Effective as of 03/29/2017    Mosie Epstein, RN   Pager: [phone]

## 2017-04-04 NOTE — Consults (Addendum)
Palliative Care Consult / History and Physical    Consult Requested by: Dr.Galka    Consult Reason: pain/symptoms    History of Present Illness: Amanda Galloway is a 72 year old female with a recent medical history of pancreatic adeocarcinoma. She underwent a whipple procedure on 03/29/17; prior to this, she had received neoadjuvant chemotherapy. She was diagnosed in the summer of 2018, and subsequently was referred to the palliative care clinic in August of 2018, where she follows with Dr. Brion Aliment for pain management. Notably, at the time of diagnosis, there was no evidence of metastatic disease, and so she was able to receive chemotherapy and undergo surgery with a possibility of cure.    As an out patient, she was noted to have pain that wasn't relieved by a celiac nerve block. She was started on Methadone 10 mg AM, 15 mg afternoon, and 10 mg PM. She has been pretty comfortable on that regimen.     Her course post operatively has been complicated by pain, initially treated with Dilaudid PCA and her methadone. She subsequently developed confusion while in the ICU, and so her PCA was stopped. Her current regimen consists of her home methadone and Dilaudid 1 mg q2 PRN IV OR Dilaudid 4 mg q3 PRN PO.     She reports that she is in nearly constant pain, which she describes as being belt like across her abdomen. She denies pain anywhere else. She states that the pain is always there, and made worse by movement or coughing. Although she has PRN Dilaudid, she doesn't think that it's helping her much at all.     Another issue for her is nausea; she has recently been permitted to eat, and she notes that she's had two meals. She reports feeling very nauseous, however, so much so that she can't really eat. She does receive zofran for this, but doesn't appreciate much difference.     What bothers you the most? The pain  What helps you cope? her daughter      Past Medical History:   Diagnosis Date    Acute kidney failure 03/31/2017     Cancer     Depression     Fibromyalgia     Hypothyroidism        Past Surgical History:   Procedure Laterality Date    CHOLECYSTECTOMY      CHOLECYSTECTOMY, LAPAROSCOPIC  09/06/2016    HYSTERECTOMY  08/1986    Fibroids    KNEE SURGERY Right     PR INSERT TUNNELED CV CATH WITH PORT Right 10/04/2016    Procedure: Right IJ MEDIPORT Insertion;  Surgeon: Delano Metz, MD;  Location: [facility] MAIN OR;  Service: Oncology General    PR LAP,DIAGNOSTIC ABDOMEN N/A 10/04/2016    Procedure: LAPAROSCOPY DIAGNOSTIC;  Surgeon: Delano Metz, MD;  Location: [facility] MAIN OR;  Service: Oncology General    PR PART [place] PANC,PROX+REMV DUOD+ANAST N/A 03/29/2017    Procedure: WHIPPLE PROCEDURE;  Surgeon: Delano Metz, MD;  Location: [facility] MAIN OR;  Service: Oncology General    ROTATOR CUFF REPAIR Right     TONSILLECTOMY AND ADENOIDECTOMY       Family Hx:   Family History   Problem Relation Age of Onset    Breast cancer Mother         died 49    Cancer Father         NHL, died 7    Heart Disease Father     Diabetes Sister  Obesity Sister         4 siblings     Social Hx:   She is one of 5 children.  She has 2 children of her own.  One daughter has worked here at [employer] in the oncology area and she is living with that daughter right now.  All the children and grandchildren are aware of her condition at this point. She is from [place] originally but does not have a lot of faith in the doctors down there and prefers to get her care here.  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 Are there any particular cultural beliefs or practices that are important for [name] to know as we take care of you? She has a strong Christian faith  2. Are there any particular spiritual or religious beliefs or practices that are important for [name] to know as we take care of you? Christian  3. Would you and your family appreciate a visit from an interfaith chaplain?  They are available 24/7. Did not address today      Allergies:   No Known Allergies (drug, envir, food or latex)    Active Medications:   enoxaparin  1 mg/kg Subcutaneous Q12H    metoclopramide  5 mg Oral TID AC    lactulose  20 g Oral Daily    pantoprazole  40 mg Oral QAM    dexamethasone  4 mg Per NG tube Daily    acetaminophen  1,000 mg Oral Q8H    insulin lispro  0-12 Units Subcutaneous 4x Daily AC & HS    senna  2 tablet Oral 2 times per day    docusate sodium  200 mg Oral 2 times per day    levothyroxine  137 mcg Oral Daily    methadone  10 mg Oral QAM    And    methadone  15 mg Oral Daily with lunch    And    methadone  10 mg Oral Nightly    amitriptyline  25 mg Oral Nightly    escitalopram  20 mg Oral Nightly    pregabalin  150 mg Oral Nightly     Continuous Infusions:    PRN Meds:  HYDROmorphone, HYDROmorphone PF, ondansetron, naloxone, phenol    Palliative Care Review of Systems:  ROS unobtainable/patient unresponsive no   Pain Severity: Severe (if none, erase pain descriptors)    Location - across abdomen   Quality - ache, sore, sharp  Constant or intermittent - constant   Aggravating factors - movement, eating   Alleviating factors - thus far, nothing   Related symptoms - nausea  Shortness of breath None   Anxiety Moderate  Depression None  Drowsiness: None  Nausea Moderate,   Loss of appetite None  Constipation Mild  Tiredness Mild  Loss of well-being Moderate  Confusion None  Weakness  no  Dysuria  no  Fever  no  Rash no   Visual changes  no  Bleeding  no  Lymphadenopathy  no    Palliative Performance Scale    % Ambulation Activity Level  Evidence of Disease Self-Care Intake Level of Consciousness Estimated Median Survival in Days         (a) (b) (c)   100 Full Normal   No Disease Full Normal Full - - -   90 Full Normal  Some Disease Full Normal Full - - 108   80 Full  Normal with Effort  Some Disease Full Normal or Reduced Full - - 108   70 Reduced Cant do normal job   or work  Some Disease Full As above Full 145 - 108   60 Reduced  Cant do hobbies or housework  Significant Disease Occasional Assistance  Needed As above Full or Confusion 29 4 108   50 Mainly sit/lie Cant do any work  Extensive Disease Considerable Assistance  Needed As above Full or Confusion 30 11 41   40 Mainly  in Bed As above Mainly Assistance As above Full or Drowsy or Confusion 18 8 41   30 Bed Bound As above Total Care Reduced As above 8 5 41   20 Bed Bound As above As above Minimal As above _0 Bed Bound As above As above Mouth Care Only Drowsy or Coma _1 0 Death - - - -  -  -  -       (a) Survival post-admission to an inpatient palliative unit, all diagnoses ([place]).  (b) Days until inpatient death following admission to an acute hospice unit, diagnoses not specified ([place]).  (c) Survival post admission to an inpatient palliative unit, cancer patients only ([place]).    Patient Capacity:  Full    Prior Advance Care Planning:    DNI: no    DNR: no    Health Care Proxy: yes    Living Will: no    Health Care Proxy Name/Relationship: Daughter    Current Therapies:  No ventilator, dialysis, feeding tube, or TPN.    Physical Examination:  Vitals:    04/04/17 0811   BP: 146/80   Pulse: 71   Resp: 16   Temp: 36.8 C (98.2 F)   Weight:    Height:        General appearance: Alert, uncomfortable, appears anxious, laying in bed.  Lungs: clear to auscultation bilaterally  Heart: regular rate and rhythm, S1, S2 normal, no murmur, click, rub or gallop  Abdomen: distended abdomen with vertical surgical scar and surrounding ecchymoses. Right sided JP drain site with dressing on top, leaking a little. No erythema. Hypoactive bowel sounds in all four quadrants.   Extremities: extremities normal, atraumatic, no cyanosis or edema  Pulses: 2+ and symmetric  Neurologic: Grossly normal    Lab Results:   All labs in the last 24 hours:   Recent Results (from the past 24 hour(s))   POCT glucose    Collection Time: 04/03/17  1:23 PM   Result Value Ref Range     Glucose POCT 112 (H) 60 - 99 mg/dL   POCT glucose    Collection Time: 04/03/17  5:56 PM   Result Value Ref Range    Glucose POCT 95 60 - 99 mg/dL   POCT glucose    Collection Time: 04/03/17  8:27 PM   Result Value Ref Range    Glucose POCT 98 60 - 99 mg/dL   CBC    Collection Time: 04/04/17 12:45 AM   Result Value Ref Range    WBC 12.1 (H) 4.0 - 10.0 THOU/uL    RBC 3.2 (L) 3.9 - 5.2 MIL/uL    Hemoglobin 9.8 (L) 11.2 - 15.7 g/dL    Hematocrit 30 (L) 34 - 45 %    MCV 93 79 - 95 fL    MCH 30 26 - 32 pg/cell    MCHC 33 32 - 36 g/dL  RDW 18.9 (H) 11.7 - 14.4 %    Platelets 318 160 - 370 THOU/uL   Comprehensive metabolic panel    Collection Time: 04/04/17 12:45 AM   Result Value Ref Range    Sodium 134 133 - 145 mmol/L    Potassium 4.2 3.3 - 5.1 mmol/L    Chloride 92 (L) 96 - 108 mmol/L    CO2 26 20 - 28 mmol/L    Anion Gap 16 7 - 16    UN 14 6 - 20 mg/dL    Creatinine 0.89 0.51 - 0.95 mg/dL    GFR,Caucasian 66 *    GFR,Black 76 *    Glucose 100 (H) 60 - 99 mg/dL    Calcium 8.6 8.6 - 10.2 mg/dL    Total Protein 5.4 (L) 6.3 - 7.7 g/dL    Albumin 3.4 (L) 3.5 - 5.2 g/dL    Bilirubin,Total 0.3 0.0 - 1.2 mg/dL    AST 42 (H) 0 - 35 U/L    ALT 61 (H) 0 - 35 U/L    Alk Phos 142 (H) 35 - 105 U/L   Magnesium    Collection Time: 04/04/17 12:45 AM   Result Value Ref Range    Magnesium 1.8 1.6 - 2.5 mg/dL   Phosphorus    Collection Time: 04/04/17 12:45 AM   Result Value Ref Range    Phosphorus 4.8 (H) 2.7 - 4.5 mg/dL   POCT glucose    Collection Time: 04/04/17  7:56 AM   Result Value Ref Range    Glucose POCT 103 (H) 60 - 99 mg/dL   POCT glucose    Collection Time: 04/04/17 12:39 PM   Result Value Ref Range    Glucose POCT 106 (H) 60 - 99 mg/dL       Radiology Impressions:    Abd film - mild gastric distension and no sign of ileus or obstruction    Assessment/Plan:  Annarae Macnair is a 72 year old female with pancreatic cancer, now 6 days s/p Whipple procedure and with significant pain, anxiety, and nausea. She is not controlled on  her current regimen.     Pain -  Carrah is in quite a lot of pain, and has gotten pretty far behind on her pain medication. We understand that she had significant confusion in the ICU while on her Dilaudid PCA, but she is now in such pain that she is anxious and very distressed.   -- continue her home methadone  -- would restart the Dilaudid PCA at a pretty low dose, 0.5 mg q20 minutes, and see how she does with that. It might be what she needs to get ahead of her pain. If not effective, could increase the PCA dose to 1 mg q 20 minutes.     Nausea - Some of this may be due to the pain, as well as her recent surgery. Would focus on her pain for now. Would also recommend a trial of Ativan 0.25 mg q 6 hours as needed. This should help her nausea.    Anxiety - as above, a small dose of Ativan might be helpful. Notably, she does take Ativan at home, usually prior to sleep, and again during the day if needed.   -- She may also benefit from having an in-depth discussion with someone familiar with the healing process after undergoing a Whipple. It may be that she is recovering along a pretty normal trajectory, and simply doesn't know that.     Prognosis:  Months, potentially a year or more, depending upon recovery  Hospice: no    Family Meeting Scheduled: No    Was goals of care or advance care planning discussed? No, consult for symptoms only    Total Time Spent 45 minutes:   >50% of time was spent in counseling and/or coordination of care.     [place] Fellow  04/04/2017 12:30 PM     Attending Addendum:  I have seen and evaluated Tad Moore with Dr. Nancy Nordmann. I have participated in key aspects of decision making and agree with the impressions and plans. Ms Cavanah is 6 days post-op with continued pain, fear and anxiety over uncertain course and her set back after taking po 2 days ago.     Agree with  Re-starting dilaudid PCA but at a lower dose = 0.16m IV q 20 minutes.    Start low dose ativan  0.259mq 6 hrs prn.  Continue methadone    Favor starting low dosages to minimize side effecs (eg, confusion)  BoCharlesetta Garibaldi      .    RoCheri KearnsD  3:35 PM

## 2017-04-04 NOTE — Progress Notes (Signed)
Brief progress note: NG tube insertion    Right and left nares prepared with lidocaine urojet. 84 French NG tube placed without incident. Secured at [employer]. Immediate return of 1843mL bilious content. Patient states she felt immediate relief.    Riki Altes, DO  04/04/2017  4:13 PM

## 2017-04-04 NOTE — Plan of Care (Signed)
Bowel Elimination     Elimination patterns are normal or improving Progressing towards goal        Cognitive function     Cognitive function will be maintained Progressing towards goal        Mobility     Functional status is maintained or improved - Geriatric Progressing towards goal        Nutrition     Patient's nutritional status is maintained or improved Progressing towards goal        Pain/Comfort     Patient's pain or discomfort is manageable Progressing towards goal        Post-Operative Bowel Elimination     Elimination pattern is normal or improving Progressing towards goal        Post-Operative Complications     Patient will remain free from symptoms of infection-post op Progressing towards goal        Post-Operative Hemodynamic Stability     Maintain Hemodynamic Stability Progressing towards goal        Safety     Patient will remain free of falls Progressing towards goal

## 2017-04-04 NOTE — Progress Notes (Signed)
Pt in a lot of pain, IV Dilaudid given with minimal effect. Pt c/o feeling bloated, nausea. Dry heaving, emesis x 1. Wannetta Sender, MD notified NG tube placed and large amt of bilious fluid came out. Pt feeling that pain and nausea is better now. Pt promiced to ambulate in the evening as she is feeling better (refused to ambulate all day). JP site leaky, pouch applied. Will continue to monitor and assess.

## 2017-04-04 NOTE — Plan of Care (Signed)
Problem: Safety  Goal: Patient will remain free of falls  Outcome: Maintaining      Problem: Pain/Comfort  Goal: Patient's pain or discomfort is manageable  Outcome: Maintaining      Problem: Mobility  Goal: Functional status is maintained or improved - Geriatric  Outcome: Maintaining      Problem: Nutrition  Goal: Patient's nutritional status is maintained or improved  Outcome: Maintaining      Problem: Cognitive function  Goal: Cognitive function will be maintained  Outcome: Maintaining      Problem: Bowel Elimination  Goal: Elimination patterns are normal or improving  Outcome: Maintaining      Problem: Post-Operative Hemodynamic Stability  Goal: Maintain Hemodynamic Stability  Outcome: Maintaining      Problem: Post-Operative Complications  Goal: Patient will remain free from symptoms of infection-post op  Outcome: Maintaining      Problem: Post-Operative Bowel Elimination  Goal: Elimination pattern is normal or improving  Outcome: Maintaining

## 2017-04-04 NOTE — Plan of Care (Signed)
Bowel Elimination     Elimination patterns are normal or improving Maintaining        Cognitive function     Cognitive function will be maintained Maintaining        Mobility     Functional status is maintained or improved - Geriatric Maintaining        Nutrition     Patient's nutritional status is maintained or improved Maintaining        Pain/Comfort     Patient's pain or discomfort is manageable Maintaining        Post-Operative Bowel Elimination     Elimination pattern is normal or improving Maintaining        Post-Operative Complications     Patient will remain free from symptoms of infection-post op Maintaining        Post-Operative Hemodynamic Stability     Maintain Hemodynamic Stability Maintaining        Safety     Patient will remain free of falls Maintaining

## 2017-04-04 NOTE — Progress Notes (Addendum)
Surgical Oncology/Hepatobiliary Surgery Progress Note     LOS: 6 days     Subjective:    Received lasix yesterday; UOP 3900 for past 24hrs.  No desats yesterday/overnight.  C/o nausea and pain.  +Flatus/+BM.    Had KUB overnight at daughter's request.      Objective:    Vitals Sign Ranges for Past 24 Hours:  BP: (120-160)/(78-90)   Temp:  [36 C (96.8 F)-37.1 C (98.7 F)]   Temp src: Oral (01/31 0811)  Heart Rate:  [71-87]   Resp:  [15-18]   SpO2:  [93 %-96 %]       Physical Exam:    General Appearance: alert, no distress  HEENT: face symmetric, mildly edemetous  Cardiac: RRR  Respiratory: non-labored  Abdomen: soft, TTP, midline incision c/d/i, pre-existing abdominal wall ecchyosis  Genitourinary: no foley  Extremities: warm, well-perfused, 1+ edema bilaterally  Neuro: alert and oriented to place, time, and self    Labs:    CBC:    Recent Labs  Lab 04/04/17  0045 04/03/17  0105 04/02/17  1211 04/02/17  0046 04/01/17  1610 04/01/17  0853 04/01/17  0428   WBC 12.1* 8.9  --  9.2 8.2 7.7 9.4   Hemoglobin 9.8* 8.6* 8.3* 7.8* 7.4* 6.2* 6.3*   Hematocrit 30* 27* 27* 25* 24* 21* 20*   Platelets 318 286  --  256 203 201 347       Metabolic Panel:    Recent Labs  Lab 04/04/17  0045 04/03/17  0105 04/02/17  0046 04/01/17  0428 03/31/17  1240 03/31/17  0415  03/30/17  0016   Sodium 134 139 138 139 137 137  --  136   Potassium 4.2 3.9 4.5 4.9 4.3 4.7  < > CANCELED   Chloride 92* 97 99 99 99 99  --  96   CO2 26 29* 29* 27 29* 29*  --  20   UN 14 15 18 17 18  21*  --  16   Creatinine 0.89 0.90 0.88 0.85 0.89 1.10*  --  0.94   Glucose 100* 88 131* 130* 128* 139*  --  165*   Calcium 8.6 8.2* 8.6 8.2* 7.8* 7.7*  --  8.0*   Magnesium 1.8 1.9 2.2 2.5  --  2.6*  --  2.4   Phosphorus 4.8* 3.5 2.8 3.9  --  4.6*  --  5.7*   < > = values in this interval not displayed.       Assessment:    72 y.o. female with pancreatic cancer POD # 6  status post whipple procedure.    KUB showed mild gastric distention but + flatus.  Still net +5.6L from  admission but net -3.2L yesterday.    Plan:    -- on therapeutic lovenox d/t recent hx/o PE.  -- Consult home APS physician (Dr. Brion Aliment). For now continue analgesia with methadone 10mg  breakfast, 15mg  lunch, 10mg  dinner. Dilaudid 4mg  PO for prn, can convert to IV if not tolerating PO  -- Continue reg diet   -- Repeat labs tomorrow    Donata Duff, MD  04/04/2017     10:05 AM  General Surgery Resident    HPB-GI Attending Addendum:   I personally examined the patient, reviewed the notes, and discussed the plan of care with the residents and patient. Agree with detailed resident's note. Please see the note above for details of history, exam, labs, assessment/plan which reflect my input.  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     She has persistent nausea and intermittent small volume emesis.     Pulmonary insufficiency resolving. CXR performed. Diuresis. Oxygen supplementation as needed. Incentive spirometry.   Mild ileus. Continued nausea. Reglan started. Will consider nasogastric tube.  Constipation present pre operatively with history of excessive laxative use. Will give lactulose TID until response.   Hyperglycemia without need for insulin. Monitor.   Post operative anemia. No evidence of bleeding.  Volume overload with 10 liter positive fluid status. Diuresis with lasix. Monitor electrolytes.   Hypoalbuminemia. Malnutrition.         Delano Metz, MD, FACS  Hepatobiliary, Pancreas & GI Surgery

## 2017-04-05 LAB — CBC
Hematocrit: 28 % — ABNORMAL LOW (ref 34–45)
Hemoglobin: 8.7 g/dL — ABNORMAL LOW (ref 11.2–15.7)
MCH: 30 pg/cell (ref 26–32)
MCHC: 31 g/dL — ABNORMAL LOW (ref 32–36)
MCV: 96 fL — ABNORMAL HIGH (ref 79–95)
Platelets: 329 10*3/uL (ref 160–370)
RBC: 2.9 MIL/uL — ABNORMAL LOW (ref 3.9–5.2)
RDW: 18.7 % — ABNORMAL HIGH (ref 11.7–14.4)
WBC: 10.1 10*3/uL — ABNORMAL HIGH (ref 4.0–10.0)

## 2017-04-05 LAB — COMPREHENSIVE METABOLIC PANEL
ALT: 48 U/L — ABNORMAL HIGH (ref 0–35)
AST: 31 U/L (ref 0–35)
Albumin: 3.3 g/dL — ABNORMAL LOW (ref 3.5–5.2)
Alk Phos: 115 U/L — ABNORMAL HIGH (ref 35–105)
Anion Gap: 14 (ref 7–16)
Bilirubin,Total: 0.2 mg/dL (ref 0.0–1.2)
CO2: 25 mmol/L (ref 20–28)
Calcium: 8.4 mg/dL — ABNORMAL LOW (ref 8.6–10.2)
Chloride: 96 mmol/L (ref 96–108)
Creatinine: 0.9 mg/dL (ref 0.51–0.95)
GFR,Black: 75 *
GFR,Caucasian: 65 *
Glucose: 82 mg/dL (ref 60–99)
Lab: 20 mg/dL (ref 6–20)
Potassium: 4.5 mmol/L (ref 3.3–5.1)
Sodium: 135 mmol/L (ref 133–145)
Total Protein: 5.3 g/dL — ABNORMAL LOW (ref 6.3–7.7)

## 2017-04-05 LAB — POCT GLUCOSE
Glucose POCT: 77 mg/dL (ref 60–99)
Glucose POCT: 80 mg/dL (ref 60–99)
Glucose POCT: 81 mg/dL (ref 60–99)
Glucose POCT: 83 mg/dL (ref 60–99)

## 2017-04-05 LAB — PHOSPHORUS: Phosphorus: 4.3 mg/dL (ref 2.7–4.5)

## 2017-04-05 LAB — MAGNESIUM: Magnesium: 1.9 mg/dL (ref 1.6–2.5)

## 2017-04-05 MED ORDER — METOCLOPRAMIDE HCL 5 MG/ML IJ SOLN *I*
5.0000 mg | Freq: Three times a day (TID) | INTRAMUSCULAR | Status: DC
Start: 2017-04-05 — End: 2017-04-08
  Administered 2017-04-05 – 2017-04-08 (×11): 5 mg via INTRAVENOUS
  Filled 2017-04-05 (×12): qty 2

## 2017-04-05 MED ORDER — ACETAMINOPHEN 650 MG/20.3 ML WRAPPED *I*
1000.0000 mg | Freq: Three times a day (TID) | Status: DC
Start: 2017-04-05 — End: 2017-04-08
  Administered 2017-04-05 – 2017-04-08 (×11): 1000 mg via NASOGASTRIC
  Filled 2017-04-05 (×11): qty 40.6

## 2017-04-05 MED ORDER — MAGNESIUM SULFATE 2 GM IN 50 ML *WRAPPED*
2000.0000 mg | Freq: Once | INTRAVENOUS | Status: AC
Start: 2017-04-05 — End: 2017-04-05
  Administered 2017-04-05: 2000 mg via INTRAVENOUS
  Filled 2017-04-05: qty 50

## 2017-04-05 MED ORDER — ENOXAPARIN SODIUM 80 MG/0.8ML IJ SOSY *I*
1.0000 mg/kg | PREFILLED_SYRINGE | Freq: Two times a day (BID) | INTRAMUSCULAR | Status: DC
Start: 2017-04-05 — End: 2017-04-15
  Administered 2017-04-05 – 2017-04-15 (×20): 80 mg via SUBCUTANEOUS
  Filled 2017-04-05 (×23): qty 0.8

## 2017-04-05 NOTE — Progress Notes (Signed)
Surgical Oncology/Hepatobiliary Surgery Progress Note     LOS: 7 days     Subjective:    NGT placed yesterday with 2944mL output over past 24 hrs.  Feels better after NGT placed.  UOP 755mL.  Net - 3L  -BM    Objective:    Vitals Sign Ranges for Past 24 Hours:  BP: (110-171)/(59-80)   Temp:  [35.2 C (95.4 F)-36.3 C (97.3 F)]   Temp src: Temporal (02/01 0346)  Heart Rate:  [60-77]   Resp:  [16]   SpO2:  [94 %-98 %]       Physical Exam:    General Appearance: alert, no distress  HEENT: face symmetric, mildly edemetous  Cardiac: RRR  Respiratory: non-labored  Abdomen: soft, TTP, midline incision c/d/i, pre-existing abdominal wall ecchyosis  Genitourinary: no foley  Extremities: warm, well-perfused, 1+ edema bilaterally  Neuro: alert and oriented to place, time, and self    Labs:    CBC:    Recent Labs  Lab 04/05/17  0028 04/04/17  0045 04/03/17  0105 04/02/17  1211 04/02/17  0046 04/01/17  1610 04/01/17  0853   WBC 10.1* 12.1* 8.9  --  9.2 8.2 7.7   Hemoglobin 8.7* 9.8* 8.6* 8.3* 7.8* 7.4* 6.2*   Hematocrit 28* 30* 27* 27* 25* 24* 21*   Platelets 329 318 286  --  256 203 301       Metabolic Panel:    Recent Labs  Lab 04/05/17  0028 04/04/17  0045 04/03/17  0105 04/02/17  0046 04/01/17  0428 03/31/17  1240 03/31/17  0415   Sodium 135 134 139 138 139 137 137   Potassium 4.5 4.2 3.9 4.5 4.9 4.3 4.7   Chloride 96 92* 97 99 99 99 99   CO2 25 26 29* 29* 27 29* 29*   UN 20 14 15 18 17 18  21*   Creatinine 0.90 0.89 0.90 0.88 0.85 0.89 1.10*   Glucose 82 100* 88 131* 130* 128* 139*   Calcium 8.4* 8.6 8.2* 8.6 8.2* 7.8* 7.7*   Magnesium 1.9 1.8 1.9 2.2 2.5  --  2.6*   Phosphorus 4.3 4.8* 3.5 2.8 3.9  --  4.6*          Assessment:    72 y.o. female with hx/o recent PE and pancreatic cancer POD # 7  status post whipple procedure. NGT replaced 04/04/17.    Hct stable. Cr stable.    Plan:    -- Continue NGT to LIWS  -- NPO/IVF  -- on therapeutic lovenox d/t recent hx/o PE  -- Appreciate APS recs    Donata Duff, MD   04/05/2017     9:12 AM  General Surgery Resident

## 2017-04-05 NOTE — Progress Notes (Addendum)
Palliative Care Progress Note  Brief HPI:  Amanda Galloway is a 72 year old female with a recent medical history of pancreatic adeocarcinoma. She underwent a whipple procedure on 03/29/17; prior to this, she had received neoadjuvant chemotherapy. She was diagnosed in the summer of 2018, and subsequently was referred to the palliative care clinic in August of 2018, where she follows with Dr. Brion Aliment for pain management. Notably, at the time of diagnosis, there was no evidence of metastatic disease, and so she was able to receive chemotherapy and undergo surgery with a possibility of cure.    Significant 24h Events:  Over the past roughly 18 hours, Amanda Galloway has started to feel significantly better. She had an NG placed late yesterday afternoon, and had significant output (3 liters). She was started back on a Dilaudid PCA.    Subjective:   Amanda Galloway feels better today. She feels like her pain is much better controlled. She continues to use her PCA when she gets uncomfortable, and feels that she gets good relief from it. She feels like she is more able to continue her recovery, less hopeless.     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    Acute blood loss anemia D62    Hypothyroidism E03.9    Pancreatic cancer C25.9    Anemia D64.9    Acute kidney insufficiency N28.9     hx of Pulmonary embolism I26.99       No Known Allergies (drug, envir, food or latex)    Scheduled Meds:    acetaminophen  1,000 mg Per NG tube Q8H    metoclopramide IV  5 mg Intravenous Q8H    enoxaparin  1 mg/kg Subcutaneous Q12H    lactulose  20 g Oral TID    dexamethasone  4 mg Oral Daily    pantoprazole  40 mg Intravenous Q24H    insulin regular  0-20 Units Subcutaneous Q6H    senna  2 tablet Oral 2 times per day    docusate sodium  200  mg Oral 2 times per day    levothyroxine  137 mcg Oral Daily    methadone  10 mg Oral QAM    And    methadone  15 mg Oral Daily with lunch    And    methadone  10 mg Oral Nightly    amitriptyline  25 mg Oral Nightly    escitalopram  20 mg Oral Nightly    pregabalin  150 mg Oral Nightly       Continuous Infusions:    HYDROmorphone      sodium chloride 75 mL/hr (04/05/17 0838)       PRN Meds:  LORazepam, phenol, ondansetron, naloxone    Past 24h PRN Usage:  She received 20 PRN doses of dilaudid yesterday (10 mg).     Physical Examination:   BP: (110-171)/(59-80)   Temp:  [35.7 C (96.3 F)-36.3 C (97.3 F)]   Temp src: Temporal (02/01 1232)  Heart Rate:  [60-77]   Resp:  [16]   SpO2:  [94 %-98 %]   General appearance: Alert, laying in bed, smiling.  Lungs: clear to auscultation bilaterally  Heart: regular rate and rhythm, S1, S2 normal, no murmur,  click, rub or gallop  Abdomen: mildly abdomen with vertical surgical scar and surrounding ecchymoses. Right sided JP drain site with dressing on top, leaking a little. No erythema. Hypoactive bowel sounds in all four quadrants.   Extremities: extremities normal, atraumatic, no cyanosis or edema  Pulses: 2+ and symmetric  Neurologic: Grossly normal    Assessment/Plan:    Amanda Galloway is a 72 year old woman with pancreatic cancer, now POD#7 from [place]. Her pain, which was previously not well controlled, is now better managed by the combination of a dilaudid PCA and placement of an NG.     Pain/Dyspnea  Would continue her PCA 0.5 mg q 20 minutes as needed for now. This doesn't seem to cause sedation or confusion in her. Will monitor.     Nausea  Really not an issue now that the NG has been placed.     Anxiety/Agitation  She gets good relief from Ativan     Elimination  No stool     Total Time Spent 20 minutes:   >50% of time was spent in counseling and/or coordination of care.    [place] Fellow  04/05/2017 3:12 PM    Attending Addendum:  I  have seen and evaluated Amanda Galloway with Dr. Nancy Nordmann.  I have participated in key aspects of decision making and agree with the impressions and plans.    Total Time Spent 20 + 5 minutes:   >50% of time was spent in counseling and/or coordination of care.    Cheri Kearns MD  3:24 PM

## 2017-04-05 NOTE — Progress Notes (Signed)
SPIRITUAL ASSESSMENT NOTE    Identified Religion: Christian      Pt says she has had a terrible week. She says it took everything she had to have the most recent procedure to drain the fluid and she cannot face anything else. Pt says if she had known how hard the recovery was going to be she would not have done it and she would not choose to do anything like it again. She spoke about her family, especially a 71 year old grandson, who keeps telling her she has to fight. She says she finds that very hard; she "knows" she needs to fight for their sake but she does not want to keep on if the suffering is going to be this great. She would like to eat but is afraid she will throw up so she hesitates to try.Pt says she believes that God has a reason for everything that happens to [name]. She just does not know what reason God could have for this. We talked about what Jesus would say to her and she said she believes Jesus would say he is with her every step of the way and will not abandon her. We talked about Jesus' knowledge of suffering from his own experience on the cross and his cry "my God, my God, why [id number] [email or web address] me?" - a sentiment she found resonated with her. We prayed for her strength and courage and comfort and her family's ability to hear her when she makes decisions about her quality of life going forward.        Patient Coping: Open discussion     Chaplain Interventions: Encourage patient to use religious faith as resource, Prayer, Reflective listening    Plan of Care:  Pt has a strong faith and could benefit from further visits. If she needs to explain an unwillingness in the future to accept aggressive treatment, she may appreciate help speaking to her family about that decision. I mentioned to her nurse pt's desire to eat so it can be addressed.    Lynann Beaver      11:33 AM on 04/05/2017

## 2017-04-05 NOTE — Plan of Care (Signed)
Bowel Elimination     Elimination patterns are normal or improving Progressing towards goal        Cognitive function     Cognitive function will be maintained Progressing towards goal        Mobility     Functional status is maintained or improved - Geriatric Progressing towards goal        Nutrition     Patient's nutritional status is maintained or improved Progressing towards goal        Pain/Comfort     Patient's pain or discomfort is manageable Progressing towards goal        Post-Operative Bowel Elimination     Elimination pattern is normal or improving Progressing towards goal        Post-Operative Complications     Patient will remain free from symptoms of infection-post op Progressing towards goal        Post-Operative Hemodynamic Stability     Maintain Hemodynamic Stability Progressing towards goal        Safety     Patient will remain free of falls Progressing towards goal

## 2017-04-06 LAB — COMPREHENSIVE METABOLIC PANEL
ALT: 41 U/L — ABNORMAL HIGH (ref 0–35)
AST: 31 U/L (ref 0–35)
Albumin: 3.1 g/dL — ABNORMAL LOW (ref 3.5–5.2)
Alk Phos: 104 U/L (ref 35–105)
Anion Gap: 14 (ref 7–16)
Bilirubin,Total: 0.2 mg/dL (ref 0.0–1.2)
CO2: 25 mmol/L (ref 20–28)
Calcium: 8 mg/dL — ABNORMAL LOW (ref 8.6–10.2)
Chloride: 99 mmol/L (ref 96–108)
Creatinine: 0.97 mg/dL — ABNORMAL HIGH (ref 0.51–0.95)
GFR,Black: 68 *
GFR,Caucasian: 59 * — AB
Glucose: 69 mg/dL (ref 60–99)
Lab: 20 mg/dL (ref 6–20)
Potassium: 4 mmol/L (ref 3.3–5.1)
Sodium: 138 mmol/L (ref 133–145)
Total Protein: 5 g/dL — ABNORMAL LOW (ref 6.3–7.7)

## 2017-04-06 LAB — POCT GLUCOSE
Glucose POCT: 102 mg/dL — ABNORMAL HIGH (ref 60–99)
Glucose POCT: 69 mg/dL (ref 60–99)
Glucose POCT: 84 mg/dL (ref 60–99)
Glucose POCT: 91 mg/dL (ref 60–99)
Glucose POCT: 95 mg/dL (ref 60–99)

## 2017-04-06 LAB — CBC
Hematocrit: 26 % — ABNORMAL LOW (ref 34–45)
Hemoglobin: 8.1 g/dL — ABNORMAL LOW (ref 11.2–15.7)
MCH: 30 pg/cell (ref 26–32)
MCHC: 31 g/dL — ABNORMAL LOW (ref 32–36)
MCV: 97 fL — ABNORMAL HIGH (ref 79–95)
Platelets: 349 10*3/uL (ref 160–370)
RBC: 2.7 MIL/uL — ABNORMAL LOW (ref 3.9–5.2)
RDW: 18.6 % — ABNORMAL HIGH (ref 11.7–14.4)
WBC: 8.8 10*3/uL (ref 4.0–10.0)

## 2017-04-06 LAB — MAGNESIUM: Magnesium: 2 mg/dL (ref 1.6–2.5)

## 2017-04-06 LAB — PHOSPHORUS: Phosphorus: 3.2 mg/dL (ref 2.7–4.5)

## 2017-04-06 MED ORDER — D5W & 0.45% NACL IV SOLN *I*
75.0000 mL/h | INTRAVENOUS | Status: DC
Start: 2017-04-06 — End: 2017-04-07
  Administered 2017-04-06 – 2017-04-07 (×4): 75 mL/h via INTRAVENOUS

## 2017-04-06 NOTE — Plan of Care (Signed)
Problem: Safety  Goal: Patient will remain free of falls  Outcome: Maintaining      Problem: Pain/Comfort  Goal: Patient's pain or discomfort is manageable  Outcome: Progressing towards goal      Problem: Mobility  Goal: Functional status is maintained or improved - Geriatric  Outcome: Progressing towards goal      Problem: Nutrition  Goal: Patient's nutritional status is maintained or improved  Outcome: Progressing towards goal      Problem: Cognitive function  Goal: Cognitive function will be maintained  Outcome: Maintaining      Problem: Bowel Elimination  Goal: Elimination patterns are normal or improving  Outcome: Progressing towards goal      Problem: Post-Operative Hemodynamic Stability  Goal: Maintain Hemodynamic Stability  Outcome: Maintaining      Problem: Post-Operative Complications  Goal: Patient will remain free from symptoms of infection-post op  Outcome: Progressing towards goal

## 2017-04-06 NOTE — Consults (Signed)
Sitting in chair.  Walked x 1 this am.  No flatus, high drainage.  Lorazepam x 1 2/1  Senna, reglan    Pain well-controlled during day - like control of PCA.  Increases to 5-6 during night.    Continue methadone and dilaudid PCA for pain.  No changes to recommendations.  Charlesetta Garibaldi

## 2017-04-06 NOTE — Progress Notes (Addendum)
Surgical Oncology/Hepatobiliary Surgery Progress Note     LOS: 8 days     Subjective:  NGT remains high with bilious output. No flatus.    Objective:    Vitals Sign Ranges for Past 24 Hours:  BP: (115-138)/(60-80)   Temp:  [36.2 C (97.2 F)-36.7 C (98.1 F)]   Temp src: Temporal (02/02 0843)  Heart Rate:  [60-74]   Resp:  [16]   SpO2:  [93 %-96 %]       Physical Exam:    General Appearance: alert, no distress  HEENT: face symmetric, mildly edemetous  Cardiac: RRR  Respiratory: non-labored  Abdomen: soft, TTP, midline incision c/d/i, pre-existing abdominal wall ecchyosis  Genitourinary: no foley  Extremities: warm, well-perfused, 1+ edema bilaterally  Neuro: alert and oriented to place, time, and self    Labs:    CBC:    Recent Labs  Lab 04/06/17  0036 04/05/17  0028 04/04/17  0045 04/03/17  0105 04/02/17  1211 04/02/17  0046 04/01/17  1610   WBC 8.8 10.1* 12.1* 8.9  --  9.2 8.2   Hemoglobin 8.1* 8.7* 9.8* 8.6* 8.3* 7.8* 7.4*   Hematocrit 26* 28* 30* 27* 27* 25* 24*   Platelets 349 329 318 286  --  256 102       Metabolic Panel:    Recent Labs  Lab 04/06/17  0036 04/05/17  0028 04/04/17  0045 04/03/17  0105 04/02/17  0046 04/01/17  0428   Sodium 138 135 134 139 138 139   Potassium 4.0 4.5 4.2 3.9 4.5 4.9   Chloride 99 96 92* 97 99 99   CO2 25 25 26  29* 29* 27   UN 20 20 14 15 18 17    Creatinine 0.97* 0.90 0.89 0.90 0.88 0.85   Glucose 69 82 100* 88 131* 130*   Calcium 8.0* 8.4* 8.6 8.2* 8.6 8.2*   Magnesium 2.0 1.9 1.8 1.9 2.2 2.5   Phosphorus 3.2 4.3 4.8* 3.5 2.8 3.9          Assessment:    72 y.o. female with hx/o recent PE and pancreatic cancer POD # 8  status post whipple procedure. NGT replaced 04/04/17.    Hct stable. Cr stable.    Plan:    -- Continue NGT to LIWS, await bowel fxn  -- NPO/IVF  -- on therapeutic lovenox d/t recent hx/o PE  -- Appreciate APS recs    Bluford Main, MD  04/06/2017     9:55 AM  General Surgery Resident    HPB and GI Surgery Attending:    I saw and evaluated the patient. I have  reviewed and edited the resident's/fellow's note and confirm the findings and plan of care as documented above. In good spirits. NG dark. Continue current care. Might need TPN if doesn't open in a couple days.    Charolotte Eke, MD

## 2017-04-07 LAB — POCT GLUCOSE
Glucose POCT: 81 mg/dL (ref 60–99)
Glucose POCT: 111 mg/dL — ABNORMAL HIGH (ref 60–99)
Glucose POCT: 118 mg/dL — ABNORMAL HIGH (ref 60–99)

## 2017-04-07 LAB — COMPREHENSIVE METABOLIC PANEL
ALT: 32 U/L (ref 0–35)
AST: 27 U/L (ref 0–35)
Albumin: 2.8 g/dL — ABNORMAL LOW (ref 3.5–5.2)
Alk Phos: 103 U/L (ref 35–105)
Anion Gap: 12 (ref 7–16)
Bilirubin,Total: <0.2 mg/dL (ref 0.0–1.2)
CO2: 24 mmol/L (ref 20–28)
Calcium: 7.6 mg/dL — ABNORMAL LOW (ref 8.6–10.2)
Chloride: 95 mmol/L — ABNORMAL LOW (ref 96–108)
Creatinine: 0.81 mg/dL (ref 0.51–0.95)
GFR,Black: 85 *
GFR,Caucasian: 73 *
Glucose: 471 mg/dL — ABNORMAL HIGH (ref 60–99)
Lab: 12 mg/dL (ref 6–20)
Potassium: 3.3 mmol/L (ref 3.3–5.1)
Sodium: 131 mmol/L — ABNORMAL LOW (ref 133–145)
Total Protein: 4.8 g/dL — ABNORMAL LOW (ref 6.3–7.7)

## 2017-04-07 LAB — MAGNESIUM: Magnesium: 1.6 mg/dL (ref 1.6–2.5)

## 2017-04-07 LAB — CBC
Hematocrit: 27 % — ABNORMAL LOW (ref 34–45)
Hemoglobin: 8.5 g/dL — ABNORMAL LOW (ref 11.2–15.7)
MCH: 30 pg/cell (ref 26–32)
MCHC: 32 g/dL (ref 32–36)
MCV: 95 fL (ref 79–95)
Platelets: 363 10*3/uL (ref 160–370)
RBC: 2.8 MIL/uL — ABNORMAL LOW (ref 3.9–5.2)
RDW: 18.3 % — ABNORMAL HIGH (ref 11.7–14.4)
WBC: 10 10*3/uL (ref 4.0–10.0)

## 2017-04-07 LAB — PHOSPHORUS: Phosphorus: 3.1 mg/dL (ref 2.7–4.5)

## 2017-04-07 MED ORDER — MAGNESIUM SULFATE 2 GM IN 50 ML *WRAPPED*
2000.0000 mg | Freq: Once | INTRAVENOUS | Status: AC
Start: 2017-04-07 — End: 2017-04-07
  Administered 2017-04-07: 2000 mg via INTRAVENOUS
  Filled 2017-04-07: qty 50

## 2017-04-07 MED ORDER — SODIUM CHLORIDE 0.9 % IV SOLN WRAPPED *I*
100.0000 mL/h | Status: DC
Start: 2017-04-07 — End: 2017-04-07

## 2017-04-07 MED ORDER — KCL IN DEXTROSE-NACL 20-5-0.9 MEQ/L-%-% IV SOLN *I*
100.0000 mL/h | INTRAVENOUS | Status: AC
Start: 2017-04-07 — End: 2017-04-09
  Administered 2017-04-07 – 2017-04-09 (×5): 100 mL/h via INTRAVENOUS

## 2017-04-07 MED ORDER — DEXTROSE 5% AND 0.9% NACL IV SOLN *I*
100.0000 mL/h | INTRAVENOUS | Status: DC
Start: 2017-04-07 — End: 2017-04-07
  Administered 2017-04-07: 100 mL/h via INTRAVENOUS

## 2017-04-07 NOTE — Plan of Care (Signed)
Problem: Safety  Goal: Patient will remain free of falls  Outcome: Progressing towards goal      Problem: Pain/Comfort  Goal: Patient's pain or discomfort is manageable  Outcome: Progressing towards goal      Problem: Mobility  Goal: Functional status is maintained or improved - Geriatric  Outcome: Progressing towards goal      Problem: Nutrition  Goal: Patient's nutritional status is maintained or improved  Outcome: Progressing towards goal      Problem: Cognitive function  Goal: Cognitive function will be maintained  Outcome: Progressing towards goal      Problem: Bowel Elimination  Goal: Elimination patterns are normal or improving  Outcome: Progressing towards goal      Problem: Post-Operative Hemodynamic Stability  Goal: Maintain Hemodynamic Stability  Outcome: Progressing towards goal      Problem: Post-Operative Complications  Goal: Patient will remain free from symptoms of infection-post op  Outcome: Progressing towards goal      Problem: Post-Operative Bowel Elimination  Goal: Elimination pattern is normal or improving  Outcome: Progressing towards goal

## 2017-04-07 NOTE — Progress Notes (Addendum)
Surgical Oncology/Hepatobiliary Surgery Progress Note     LOS: 9 days     Subjective:  No events. No flatus. NGT remains high with bilious output. Hyponatremic.    Objective:    Vitals Sign Ranges for Past 24 Hours:  BP: (104-135)/(52-63)   Temp:  [36 C (96.8 F)-36.7 C (98.1 F)]   Temp src: Temporal (02/03 0749)  Heart Rate:  [66-82]   Resp:  [16]   SpO2:  [92 %-99 %]       Physical Exam:    General Appearance: alert, no distress  HEENT: face symmetric, mildly edemetous  Cardiac: RRR  Respiratory: non-labored  Abdomen: soft, mild distention. Generalized tenderness, midline incision c/d/i, pre-existing abdominal wall ecchyosis  Genitourinary: no foley  Extremities: warm, well-perfused, 1+ edema bilaterally  Neuro: alert and oriented to place, time, and self    Labs:    CBC:    Recent Labs  Lab 04/07/17  0155 04/06/17  0036 04/05/17  0028 04/04/17  0045 04/03/17  0105 04/02/17  1211 04/02/17  0046   WBC 10.0 8.8 10.1* 12.1* 8.9  --  9.2   Hemoglobin 8.5* 8.1* 8.7* 9.8* 8.6* 8.3* 7.8*   Hematocrit 27* 26* 28* 30* 27* 27* 25*   Platelets 363 349 329 318 286  --  329       Metabolic Panel:    Recent Labs  Lab 04/07/17  0155 04/06/17  0036 04/05/17  0028 04/04/17  0045 04/03/17  0105 04/02/17  0046   Sodium 131* 138 135 134 139 138   Potassium 3.3 4.0 4.5 4.2 3.9 4.5   Chloride 95* 99 96 92* 97 99   CO2 24 25 25 26  29* 29*   UN 12 20 20 14 15 18    Creatinine 0.81 0.97* 0.90 0.89 0.90 0.88   Glucose 471* 69 82 100* 88 131*   Calcium 7.6* 8.0* 8.4* 8.6 8.2* 8.6   Magnesium 1.6 2.0 1.9 1.8 1.9 2.2   Phosphorus 3.1 3.2 4.3 4.8* 3.5 2.8          Assessment:    72 y.o. female with hx/o recent PE and pancreatic cancer POD # 9  status post whipple procedure. NGT replaced 04/04/17.    Plan:    -- Continue NGT to LIWS, await bowel fxn, likely start TPN in 1-2 days  -- NPO. Swith to NS form 1/2NS given hyponatremia  -- Continue therapeutic lovenox d/t recent hx/o PE  -- Appreciate APS recs    Bluford Main, MD  04/07/2017      7:51 AM  General Surgery Resident    HPB and GI Surgery Attending:    I saw and evaluated the patient. I have reviewed and edited the resident's/fellow's note and confirm the findings and plan of care as documented above. Comfortable today. NG aspirate clearing. Appreciate Palliative follow up.    Charolotte Eke, MD

## 2017-04-07 NOTE — Consults (Signed)
Still no flatus. Walked x 3 yesterday.    Pain 1-5 with PCA (using about 35-40mg  dilaudid a day)  Continue methadone 10 - 15 - 10.    She remains overall much more comfortable. Would maintain PCA and its current dose/settings until she starts taking po. Primary team considering TPN.  Charlesetta Garibaldi

## 2017-04-08 ENCOUNTER — Inpatient Hospital Stay: Payer: Medicare (Managed Care)

## 2017-04-08 DIAGNOSIS — R1013 Epigastric pain: Secondary | ICD-10-CM

## 2017-04-08 DIAGNOSIS — R188 Other ascites: Secondary | ICD-10-CM

## 2017-04-08 DIAGNOSIS — R5381 Other malaise: Secondary | ICD-10-CM

## 2017-04-08 DIAGNOSIS — G4700 Insomnia, unspecified: Secondary | ICD-10-CM

## 2017-04-08 DIAGNOSIS — R1012 Left upper quadrant pain: Secondary | ICD-10-CM

## 2017-04-08 DIAGNOSIS — Z90411 Acquired partial absence of pancreas: Secondary | ICD-10-CM

## 2017-04-08 LAB — CBC
Hematocrit: 27 % — ABNORMAL LOW (ref 34–45)
Hemoglobin: 8.7 g/dL — ABNORMAL LOW (ref 11.2–15.7)
MCH: 31 pg/cell (ref 26–32)
MCHC: 32 g/dL (ref 32–36)
MCV: 97 fL — ABNORMAL HIGH (ref 79–95)
Platelets: 360 10*3/uL (ref 160–370)
RBC: 2.8 MIL/uL — ABNORMAL LOW (ref 3.9–5.2)
RDW: 17.9 % — ABNORMAL HIGH (ref 11.7–14.4)
WBC: 10.5 10*3/uL — ABNORMAL HIGH (ref 4.0–10.0)

## 2017-04-08 LAB — PHOSPHORUS: Phosphorus: 3.1 mg/dL (ref 2.7–4.5)

## 2017-04-08 LAB — POCT GLUCOSE
Glucose POCT: 104 mg/dL — ABNORMAL HIGH (ref 60–99)
Glucose POCT: 110 mg/dL — ABNORMAL HIGH (ref 60–99)
Glucose POCT: 116 mg/dL — ABNORMAL HIGH (ref 60–99)
Glucose POCT: 117 mg/dL — ABNORMAL HIGH (ref 60–99)

## 2017-04-08 LAB — COMPREHENSIVE METABOLIC PANEL
ALT: 37 U/L — ABNORMAL HIGH (ref 0–35)
AST: 31 U/L (ref 0–35)
Albumin: 3.1 g/dL — ABNORMAL LOW (ref 3.5–5.2)
Alk Phos: 129 U/L — ABNORMAL HIGH (ref 35–105)
Anion Gap: 10 (ref 7–16)
Bilirubin,Total: <0.2 mg/dL (ref 0.0–1.2)
CO2: 27 mmol/L (ref 20–28)
Calcium: 8.3 mg/dL — ABNORMAL LOW (ref 8.6–10.2)
Chloride: 101 mmol/L (ref 96–108)
Creatinine: 0.83 mg/dL (ref 0.51–0.95)
GFR,Black: 82 *
GFR,Caucasian: 71 *
Glucose: 109 mg/dL — ABNORMAL HIGH (ref 60–99)
Lab: 10 mg/dL (ref 6–20)
Potassium: 4.2 mmol/L (ref 3.3–5.1)
Sodium: 138 mmol/L (ref 133–145)
Total Protein: 5.4 g/dL — ABNORMAL LOW (ref 6.3–7.7)

## 2017-04-08 LAB — MAGNESIUM: Magnesium: 2.1 mg/dL (ref 1.6–2.5)

## 2017-04-08 MED ORDER — STERILE WATER FOR IRRIGATION IR SOLN *I*
900.0000 mL | Freq: Once | Status: AC
Start: 2017-04-08 — End: 2017-04-08
  Administered 2017-04-08: 900 mL via ORAL

## 2017-04-08 MED ORDER — IOHEXOL 350 MG/ML (OMNIPAQUE) IV SOLN *I*
1.0000 mL | Freq: Once | INTRAVENOUS | Status: AC
Start: 2017-04-08 — End: 2017-04-08
  Administered 2017-04-08: 107 mL via INTRAVENOUS

## 2017-04-08 MED ORDER — ACETAMINOPHEN 10 MG/ML IV SOLN *I*
1000.0000 mg | Freq: Three times a day (TID) | INTRAVENOUS | Status: AC
Start: 2017-04-08 — End: 2017-04-09
  Administered 2017-04-09 (×2): 1000 mg via INTRAVENOUS
  Filled 2017-04-08 (×2): qty 100

## 2017-04-08 MED ORDER — LIDOCAINE HCL 1 % IJ SOLN *I*
10.0000 mL | Freq: Once | INTRAMUSCULAR | Status: AC
Start: 2017-04-08 — End: 2017-04-08
  Administered 2017-04-08: 10 mL via SUBCUTANEOUS
  Filled 2017-04-08: qty 10

## 2017-04-08 MED ORDER — METOCLOPRAMIDE HCL 5 MG/ML IJ SOLN *I*
10.0000 mg | Freq: Three times a day (TID) | INTRAMUSCULAR | Status: DC
Start: 2017-04-09 — End: 2017-04-14
  Administered 2017-04-09 – 2017-04-14 (×17): 10 mg via INTRAVENOUS
  Filled 2017-04-08 (×17): qty 2

## 2017-04-08 MED ORDER — METHADONE HCL 10 MG/ML IJ SOLN *I*
5.0000 mg | Freq: Two times a day (BID) | INTRAMUSCULAR | Status: DC
Start: 2017-04-08 — End: 2017-04-16
  Administered 2017-04-08 – 2017-04-15 (×14): 5 mg via INTRAVENOUS
  Filled 2017-04-08 (×19): qty 0.5

## 2017-04-08 MED ORDER — METHADONE HCL 10 MG/ML IJ SOLN *I*
7.5000 mg | Freq: Every day | INTRAMUSCULAR | Status: DC
Start: 2017-04-09 — End: 2017-04-16
  Administered 2017-04-10 – 2017-04-15 (×6): 7.5 mg via INTRAVENOUS
  Filled 2017-04-08 (×10): qty 0.75

## 2017-04-08 NOTE — Progress Notes (Addendum)
Palliative Care Progress Note    As background, Ms Leeb is a 72 y/o female with recent diagnosis of pancreatic adenocarcinoma s/p neoadjuvant chemotherapy and eventually Whipple procedure on 03/29/17. At the time of diagnosis there was no sign of metastatic disease. She started following in palliative care clinic in August of last year for pain management with Dr Brion Aliment.    Subjective: She feels well today, with her pain largely controlled-- she only feels "discomfort" in the abdomen. She rates it around a 2 or 3. She does not feel ready to make any changes today. She is still not passing flatus, but she is walking a few times per day. She has been enjoying watching TV to pass the time, particularly shows that she watches together with her daughter.    Palliative Care ROS:  Pain   Mild  Nausea   None  Anorexia   Moderate  Anxiety   Mild  Depression   None  Shortness of Breath   None  Tiredness/Fatigue   Moderate  Drowsiness/Sleepiness   Mild  Airway Secretions   no  Constipation   yes  Delirium   no    Physical Examination:   BP: (106-128)/(53-64)   Temp:  [36 C (96.8 F)-36.4 C (97.5 F)]   Temp src: Temporal (02/04 0754)  Heart Rate:  [63-79]   Resp:  [16]   SpO2:  [92 %-98 %]   BP 109/59 (BP Location: Left arm)    Pulse 79    Temp 36.1 C (97 F) (Temporal)    Resp 16    Ht 1.702 m (5\' 7" )    Wt 72 kg (158 lb 11.7 oz)    SpO2 97%    BMI 24.86 kg/m     General Appearance: Alert, cooperative, pleasant, no distress, appears stated age  HEENT: Eyes PERRL EOMI, conjunctivae clear, external ear canals normal bilaterally,  nares normal, septum midline, mucosa normal, no drainage or sinus tenderness; lips, mucosa, and tongue normal; teeth and gums normal  Neck: Supple, symmetrical, trachea midline, no adenopathy; thyroid without enlargement, tenderness, nodules  Lungs: Clear to auscultation bilaterally, respirations unlabored  Heart: Regular rate and rhythm, S1 and S2 normal, no murmur, rub or gallop, 2+ radial and PT  pulses bilaterally, no JVD  Abdomen: Soft, tender to minimal palpation, bowel sounds active all four quadrants, no masses, no organomegaly  Extremities: Extremities normal, atraumatic, trace pedal edema bilaterally, no cyanosis  Skin: Skin color, texture, turgor normal, no rashes or lesions  Lymph nodes: Cervical, supraclavicular, and axillary nodes normal  Neurologic: Normal resting tone, CN II-XII grossly intact    Assessment/Plan:  - Continue current hydromorphone PCA (0.5 mg demand dose q20 minutes without basal rate; yesterday's 24 hour use was 16 mg, down from 19.5 day prior), APAP ATC  - Given questionable enteric absorption of methadone given near constant NG suction, recommend transition to IV methadone at a 2:1 ratio, that is, 5 mg QAM and QHS and 7.5 mg at midday  - Continue bowel regimen  - Remainder of care per primary team    Gus Rankin, MD  04/08/17 9:36 AM    PALLIATIVE CARE ATTENDING    Patient seen on 04/08/2017 at [facility]  Reason for Visit: Pancreatic adenocarcinoma; Epigastric abdominal pain; LUQ abdominal pain; Nausea; Insomnia; Debility    Attestation: I saw and examined the patient, discussed the case with Dr. Gus Rankin, and agree with or amend her/his assessment and plan as documented 04/09/2017, as follows:  HPI:  I reviewed this patient, reviewed the resident's note and agree.  Epigastric and LUQ abdominal pain, 1/10 severity, decreased with hydromorphone IV PCA, in context of pancreatic adenocarcinoma 11 days status post Whipple procedure.    Exam:   I examined this patient, reviewed the resident's note and agree.    Medical Decision Making and Plan:   I discussed with the resident her/his documented decision making and agree.    Pancreatic adenocarcinoma; Epigastric abdominal pain; LUQ abdominal pain: Possible poor enteric medication bioavailability with high NG tube output.  Change PO to IV methadone TID: methadone IV 5mg  / 7.5mg  / 5mg .  Continue hydromorphone IV  PCA.    Nausea: Continue NGT to suction. Continue metoclopramide IV, prn ondansetron IV.    Insomnia:  Start lorazepam 0.5mg  IV qhs prn.    Debility: Continue Physical Therapy. Mobility is improving.    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    Acute blood loss anemia D62    Hypothyroidism E03.9    Pancreatic cancer C25.9    Anemia D64.9    Acute kidney insufficiency N28.9     hx of Pulmonary embolism I26.99     No Known Allergies (drug, envir, food or latex)    Scheduled Meds:    acetaminophen  1,000 mg Per NG tube Q8H    metoclopramide IV  5 mg Intravenous Q8H    enoxaparin  1 mg/kg Subcutaneous Q12H    dexamethasone  4 mg Oral Daily    pantoprazole  40 mg Intravenous Q24H    insulin regular  0-20 Units Subcutaneous Q6H    senna  2 tablet Oral 2 times per day    docusate sodium  200 mg Oral 2 times per day    levothyroxine  137 mcg Oral Daily    methadone  10 mg Oral QAM    And    methadone  15 mg Oral Daily with lunch    And    methadone  10 mg Oral Nightly    amitriptyline  25 mg Oral Nightly    escitalopram  20 mg Oral Nightly     Continuous Infusions:    dextrose 5% and 0.9% NaCl with KCl 20 mEq 100 mL/hr (04/08/17 0659)    HYDROmorphone       PRN Meds:  LORazepam, phenol, ondansetron, naloxone

## 2017-04-08 NOTE — Progress Notes (Signed)
Amanda Galloway currently requires stand by assistance for mobility. Anticipate patient will need 1-2 more PT visits to allow return to prior living environment. Discharge Recommendations: Anticipate return to prior living arrangement, Intermittent supervision/assist, Home PT (home PT pending progress) after discharge from the hospital.     PT Discharge Equipment Recommended: None     Physical Therapy Treatment Note:     04/08/17 0902   PT Tracking   PT TRACKING PT Assigned   Visit Number   Visit Number [facility]) / Treatment Day (HH) 2   Precautions/Observations   Precautions used Yes   LDA Observation IV lines;PCA   Fall Precautions General falls precautions   Other Pt up in recliner, agreeable for PT. Reports being in improved spirits   Pain Assessment   *Is the patient currently in pain? Yes   Pain (Before,During, After) Therapy Before   0-10 Scale (does not rate; states discomfort)   Pain Location Abdomen   Cognition   Arousal/Alertness Appropriate responses to stimuli   Following Commands Follows simple commands without difficulty;Follows simple commands consistently   Bed Mobility   Bed mobility Tested   Supine to Sit Independent;Side rails down;Head of bed flat   Sit to Supine Independent;Side rails down;Head of bed flat   Additional comments Pt able to compelte without difficulty or need of bed modifications   Transfers   Transfers Tested   Sit to Stand Stand by assistance;1 person assist;Verbal cues   Stand to sit Stand by assistance;1 person assist;Verbal cues   Transfer Assistive Device none   Additional comments Pt completed from recliner and edge of bed with no overt loss of balance. Pt comeplted x5 sit to stand in a row without overt loss of balance and minimal use of BUE to assist.   Mobility   Mobility Tested   Gait Pattern Decreased cadence   Ambulation Assist Stand by;1 person assist   Ambulation Distance (Feet) 520ft   Ambulation Assistive Device None   Stairs Assistance Stand by;1 person assist   Stair  Management Technique One rail;No rail;Alternating pattern;Step to pattern;Forwards   Number of Stairs 3 x 4 steps   Additional comments Pt ambulated at slow gait speed with no overt loss of balance, SBA provided for safety. Pt able to complete ambulation without AD. Pt denied faitgue during ambulation. Pt compelted stairs with use of railing requiring SBA able to complete with reciprocal pattern. Without railing, pt required CGA for hand held and used step to pattern.   Therapeutic Exercises   Additional comments Pt completed 2x10 heel raises in standing with CGA and hand held and 2x10 mini-squats in standing with hand held assist. Educated on continued ambulation to promote good mobility.    Balance   Balance Tested   Sitting - Static Independent ;Unsupported   Standing - Static Standby assist;Unsupported   Standing - Dynamic Contact guard;Unsupported   PT AM-PAC Mobility   Turning over in bed? 4   Sitting down on and standing up from a chair with arms? 1   Moving from lying on back to sitting on the side of the bed? 4   Moving to and from a bed to a chair? 3   Need to walk in hospital room? 3   Climbing 3 - 5 steps with a railing? 3   Total Raw Score 18   Standardized Score 43.63   CMS 1-100% Score 47   Medicare Functional Limit Modifiers CK 40 - < 60% impaired, limited, restricted   Assessment  Brief Assessment Remains appropriate for skilled therapy   Problem List Impaired functional mobility   Patient / Family Goal get better   Plan/Recommendation   Treatment Interventions Restorative PT   PT Frequency 3-5x/wk   Mobility Recommendations SBA without AD, please assist with ambulation 3x/day. Family able to assist with ambulation   Discharge Recommendations Anticipate return to prior living arrangement;Intermittent supervision/assist;Home PT  (home PT pending progress)   PT Discharge Equipment Recommended None   Assessment/Recommendations Reviewed With: Patient;Nursing;Care coordinator   Next PT Visit progressive  ambulation, higher level balance   Time Calculation   PT Timed Codes 24   PT Untimed Codes 0   PT Unbilled Time 0   PT Total Treatment 24   Plan and Onset date   Plan of Care Date 04/01/17   Treatment Start Date 04/01/17   PT Charges   $PT [facility] Charges Gait Training - code 0208 (24min);Functional Training - code 0239 (52min)     Rodena Piety, PT, DPT  Pager [phone]

## 2017-04-08 NOTE — Plan of Care (Signed)
Bowel Elimination     Elimination patterns are normal or improving Maintaining        Cognitive function     Cognitive function will be maintained Maintaining        Nutrition     Patient's nutritional status is maintained or improved Maintaining        Post-Operative Bowel Elimination     Elimination pattern is normal or improving Maintaining        Post-Operative Complications     Patient will remain free from symptoms of infection-post op Maintaining        Post-Operative Hemodynamic Stability     Maintain Hemodynamic Stability Maintaining          Mobility     Functional status is maintained or improved - Geriatric Progressing towards goal        Pain/Comfort     Patient's pain or discomfort is manageable Progressing towards goal        Safety     Patient will remain free of falls Progressing towards goal

## 2017-04-08 NOTE — Progress Notes (Addendum)
Surgical Oncology/Hepatobiliary Surgery Progress Note     LOS: 10 days     Subjective:  No events. Feels tired but slept overnight.  Walked around 3x yesterdaY.  No flatus, no BM.  NGT remains high.  Leaking from JP site.    Objective:    Vitals Sign Ranges for Past 24 Hours:  BP: (104-128)/(53-64)   Temp:  [36 C (96.8 F)-36.5 C (97.7 F)]   Temp src: Temporal (02/04 0429)  Heart Rate:  [63-82]   Resp:  [16]   SpO2:  [92 %-98 %]       Physical Exam:    General Appearance: alert, no distress  HEENT: face symmetric, NGT with brown output  Cardiac: RRR  Respiratory: non-labored  Abdomen: soft, mild distention. Generalized tenderness, midline incision c/d/i, pre-existing abdominal wall ecchyosis  Genitourinary: no foley  Extremities: warm, well-perfused, 1+ edema bilaterally  Neuro: alert and oriented to place, time, and self    Labs:    CBC:    Recent Labs  Lab 04/08/17  0125 04/07/17  0155 04/06/17  0036 04/05/17  0028 04/04/17  0045 04/03/17  0105   WBC 10.5* 10.0 8.8 10.1* 12.1* 8.9   Hemoglobin 8.7* 8.5* 8.1* 8.7* 9.8* 8.6*   Hematocrit 27* 27* 26* 28* 30* 27*   Platelets 360 363 349 329 318 191       Metabolic Panel:    Recent Labs  Lab 04/08/17  0125 04/07/17  0155 04/06/17  0036 04/05/17  0028 04/04/17  0045 04/03/17  0105   Sodium 138 131* 138 135 134 139   Potassium 4.2 3.3 4.0 4.5 4.2 3.9   Chloride 101 95* 99 96 92* 97   CO2 27 24 25 25 26  29*   UN 10 12 20 20 14 15    Creatinine 0.83 0.81 0.97* 0.90 0.89 0.90   Glucose 109* 471* 69 82 100* 88   Calcium 8.3* 7.6* 8.0* 8.4* 8.6 8.2*   Magnesium 2.1 1.6 2.0 1.9 1.8 1.9   Phosphorus 3.1 3.1 3.2 4.3 4.8* 3.5          Assessment:    72 y.o. female with hx/o recent PE and pancreatic cancer POD # 10  status post whipple procedure. NGT replaced 04/04/17.    Plan:    -- Continue NGT to LIWS, await bowel fxn, likely start TPN soon as still no ROBF  -- Continue reglan  -- NPO. Swithed to NS form 1/2NS given hyponatremia  -- Continue therapeutic lovenox d/t recent hx/o  PE  -- Appreciate APS recs  -- PT: last seen 1/28; re-eval  -- Dispo: awaiting ROBF    Donata Duff, MD  04/08/2017     6:49 AM  General Surgery Resident    HPB-GI Attending Addendum:   I personally examined the patient, reviewed the notes, and discussed the plan of care with the residents and patient. Agree with detailed resident's note. Please see the note above for details of history, exam, labs, assessment/plan which reflect my input.      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      Gastric distention. Delayed gastric emptying. CT scan today. Does not show obstruction. Supports delayed gastric emptying. Continue nasogastric tube. Monitor output. Replete as necessary. Metoclopramide.   Hyperglycemia without need for insulin. Monitor.   Post operative anemia. No evidence of bleeding. Dilutional.   Volume overload  Hypoalbuminemia. Malnutrition.  Delano Metz, MD, FACS  Hepatobiliary, Pancreas & GI Surgery

## 2017-04-08 NOTE — Progress Notes (Signed)
Brief Note    RLQ JP site was leaking significantly.    This area was cleaned. 62mL of 1% lidocaine was used for pain control. A figure of 8 stitch using 3-0 monocryl was placed. Gauze and tegaderm dressing was applied.    Donata Duff, MD  04/08/2017  8:44 AM

## 2017-04-08 NOTE — Progress Notes (Signed)
Hinton Lovely, RN received handoff report from [place] on wcc5 unit for CT scan in [facility] 04/08/17 9:51 AM.

## 2017-04-09 DIAGNOSIS — F329 Major depressive disorder, single episode, unspecified: Secondary | ICD-10-CM

## 2017-04-09 DIAGNOSIS — R1010 Upper abdominal pain, unspecified: Secondary | ICD-10-CM

## 2017-04-09 LAB — COMPREHENSIVE METABOLIC PANEL
ALT: 46 U/L — ABNORMAL HIGH (ref 0–35)
ALT: 54 U/L — ABNORMAL HIGH (ref 0–35)
AST: 46 U/L — ABNORMAL HIGH (ref 0–35)
AST: 53 U/L — ABNORMAL HIGH (ref 0–35)
Albumin: 3.1 g/dL — ABNORMAL LOW (ref 3.5–5.2)
Albumin: 3 g/dL — ABNORMAL LOW (ref 3.5–5.2)
Alk Phos: 189 U/L — ABNORMAL HIGH (ref 35–105)
Alk Phos: 198 U/L — ABNORMAL HIGH (ref 35–105)
Anion Gap: 12 (ref 7–16)
Anion Gap: 10 (ref 7–16)
Bilirubin,Total: 0.2 mg/dL (ref 0.0–1.2)
Bilirubin,Total: 0.2 mg/dL (ref 0.0–1.2)
CO2: 23 mmol/L (ref 20–28)
CO2: 22 mmol/L (ref 20–28)
Calcium: 8.1 mg/dL — ABNORMAL LOW (ref 8.6–10.2)
Calcium: 7.9 mg/dL — ABNORMAL LOW (ref 8.6–10.2)
Chloride: 103 mmol/L (ref 96–108)
Chloride: 101 mmol/L (ref 96–108)
Creatinine: 0.95 mg/dL (ref 0.51–0.95)
Creatinine: 0.84 mg/dL (ref 0.51–0.95)
GFR,Black: 70 *
GFR,Black: 81 *
GFR,Caucasian: 61 *
GFR,Caucasian: 70 *
Glucose: 110 mg/dL — ABNORMAL HIGH (ref 60–99)
Glucose: 119 mg/dL — ABNORMAL HIGH (ref 60–99)
Lab: 5 mg/dL — ABNORMAL LOW (ref 6–20)
Lab: 5 mg/dL — ABNORMAL LOW (ref 6–20)
Potassium: 4.3 mmol/L (ref 3.3–5.1)
Potassium: 4.2 mmol/L (ref 3.3–5.1)
Sodium: 138 mmol/L (ref 133–145)
Sodium: 133 mmol/L (ref 133–145)
Total Protein: 5.5 g/dL — ABNORMAL LOW (ref 6.3–7.7)
Total Protein: 5.2 g/dL — ABNORMAL LOW (ref 6.3–7.7)

## 2017-04-09 LAB — CBC
Hematocrit: 27 % — ABNORMAL LOW (ref 34–45)
Hemoglobin: 8.6 g/dL — ABNORMAL LOW (ref 11.2–15.7)
MCH: 31 pg/cell (ref 26–32)
MCHC: 32 g/dL (ref 32–36)
MCV: 96 fL — ABNORMAL HIGH (ref 79–95)
Platelets: 370 10*3/uL (ref 160–370)
RBC: 2.8 MIL/uL — ABNORMAL LOW (ref 3.9–5.2)
RDW: 18.2 % — ABNORMAL HIGH (ref 11.7–14.4)
WBC: 10.2 10*3/uL — ABNORMAL HIGH (ref 4.0–10.0)

## 2017-04-09 LAB — MAGNESIUM: Magnesium: 1.8 mg/dL (ref 1.6–2.5)

## 2017-04-09 LAB — POCT GLUCOSE
Glucose POCT: 108 mg/dL — ABNORMAL HIGH (ref 60–99)
Glucose POCT: 110 mg/dL — ABNORMAL HIGH (ref 60–99)
Glucose POCT: 115 mg/dL — ABNORMAL HIGH (ref 60–99)
Glucose POCT: 130 mg/dL — ABNORMAL HIGH (ref 60–99)

## 2017-04-09 LAB — SURGICAL PATHOLOGY

## 2017-04-09 LAB — TRIGLYCERIDES: Triglycerides: 153 mg/dL — AB

## 2017-04-09 LAB — PHOSPHORUS: Phosphorus: 3 mg/dL (ref 2.7–4.5)

## 2017-04-09 MED ORDER — LORAZEPAM 2 MG/ML IJ SOLN *I*
0.2500 mg | INTRAMUSCULAR | Status: DC | PRN
Start: 2017-04-09 — End: 2017-04-16
  Administered 2017-04-09 – 2017-04-16 (×6): 0.25 mg via INTRAVENOUS
  Filled 2017-04-09 (×8): qty 1

## 2017-04-09 MED ORDER — FAT EMULSION SOYBEAN OIL 20 % IV EMUL *WRAPPED*
60.0000 g | Freq: Every day | INTRAVENOUS | Status: AC
Start: 2017-04-09 — End: 2017-04-10
  Administered 2017-04-09: 60 g via INTRAVENOUS
  Filled 2017-04-09: qty 300

## 2017-04-09 MED ORDER — MAGNESIUM SULFATE 2 GM IN 50 ML *WRAPPED*
2000.0000 mg | Freq: Once | INTRAVENOUS | Status: AC
Start: 2017-04-09 — End: 2017-04-09
  Administered 2017-04-09: 2000 mg via INTRAVENOUS
  Filled 2017-04-09: qty 50

## 2017-04-09 MED ORDER — THIAMINE HCL 100 MG/ML IJ SOLN *I*
INTRAVENOUS | Status: AC
Start: 2017-04-09 — End: 2017-04-10
  Filled 2017-04-09: qty 636

## 2017-04-09 NOTE — Progress Notes (Signed)
Nutrition Progress Note      O:    Height: 170.2 cm (5\' 7" )  Weight: 72 kg (158 lb 11.7 oz)              BMI (Calculated): 24.9  Energy Intake: <75% EEE x >7 days             Edema w/ HypoAlbuminemia:   RUE Edema: Trace  LUE Edema: Trace  RLE Edema: Mild pitting, slight indentation  LLE Edema: Mild pitting, slight indentation            Registered Dietitian Assessment: Upon nutritional assessment, patient meets criteria for: moderate malnutrition related to acute illness as evidenced by insufficient energy intake (<75% EEE x >7 days), localized or generalized fluid accumulation that may sometimes mask weight loss. Despite BMI of 24.9 WNL and stable weight, the patient still meets the criteria for malnutrition based on the consensus statement from the Academy of Nutrition and Dietetics and the [place] of Parenteral and Enteral Nutrition.      Please refer to nutrition assessment dated 2/5 for detailed assessment/recommendation.     Present on Admission: Not determinable at this time        Plan/Recommendations:  1. Please check and replete K+/Phos/Mg++ to WNL with IV replacements prior to starting TPN.   2. Continue to monitor K+/Phos/Mg++ daily as TPN is advanced to goal. Replete outside of TPN, as needed.  3. Once central access is obtained and electrolytes are WNL, recommend TPN (2 in 1) Adult Continuous plus Fat (central infusion site) with dextrose advanced to goal over 3 days:         Day 1:  Rate: 75 mL/hr                     Volume (Amino Acid/Dextrose solution): 1800 mL/day                                Amino acids: 53 g/L                                 Dextrose: 76 g/L                                 Standard additives:                                            100 mg thiamine                                                   1 mg folic acid                                                   Balance Chloride:Acetate  Insulin: 0 units/LITER                           Fat Emulsion 20% infusion: 60 gm/day (to be infused over 12 hours)              Day 2: If BGs/electrolytes are acceptable, advance dextrose to 122 g/L              Day 3: If BGs/electrolytes are acceptable, advance dextrose to 167 g/L (goal)  -TPN, at goal, will provide 2004 kcal/day, 95 g protein/day, 301 g dextrose/day and 2100 mL total fluid/day (including amino acid/dextrose solution and lipid volume).   4.   Please adjust or D/C IVF once TPN is initiated (TPN will provide for baseline fluid needs). Continue to monitor fluid balance and provide non-dextrose containing IVF to replace any extraordinary losses.  5.   Please order TPN Monitoring Panel.  6.   Advance diet as tolerated   7.   Will monitor nutrition-related labs, weight trend, BM pattern. Will follow per high risk protocol.     Laurence Aly, RD  Pager [phone]      Attention to provider:   By co-signing this note, I agree with the diagnosis above.  Please document the malnutrition diagnosis in the patients progress note.

## 2017-04-09 NOTE — Consults (Signed)
Medical Nutrition Therapy - Initial Assessment    Admit Date: 03/29/2017    Reason for consult: TPN recommendations     Patient Summary: 72 y.o. female with hx/o recent PE and pancreatic cancer who is status post whipple procedure. NGT replaced 04/04/17    Past Medical History:   Diagnosis Date    Acute kidney failure 03/31/2017    Cancer     Depression     Fibromyalgia     Hypothyroidism      Past Surgical History:   Procedure Laterality Date    CHOLECYSTECTOMY      CHOLECYSTECTOMY, LAPAROSCOPIC  09/06/2016    HYSTERECTOMY  08/1986    Fibroids    KNEE SURGERY Right     PR INSERT TUNNELED CV CATH WITH PORT Right 10/04/2016    Procedure: Right IJ MEDIPORT Insertion;  Surgeon: Delano Metz, MD;  Location: [facility] MAIN OR;  Service: Oncology General    PR LAP,DIAGNOSTIC ABDOMEN N/A 10/04/2016    Procedure: LAPAROSCOPY DIAGNOSTIC;  Surgeon: Delano Metz, MD;  Location: [facility] MAIN OR;  Service: Oncology General    PR PART [place] PANC,PROX+REMV DUOD+ANAST N/A 03/29/2017    Procedure: WHIPPLE PROCEDURE;  Surgeon: Delano Metz, MD;  Location: [facility] MAIN OR;  Service: Oncology General    ROTATOR CUFF REPAIR Right     TONSILLECTOMY AND ADENOIDECTOMY          Pertinent Social Hx: divorced, non-smoker     Pertinent Meds: amitriptyline, Lexapro, levothyroxine, dilaudid, decadron, Protonix, SSI, D5 NS with 20 mEq KCl/L, methadone, reglan, Mg sulfate   Pertinent Labs: Ca 8.82 WNL corrected for low albumin 3.1, a phos 189 (H); t bili 0.2 WNL; BGs range 110-117 past 24 hours, no insulin coverage required    Reviewed I/O's: 2600 ml NGT output      Enteral or parenteral access: mediport; (NGT to suction)    Nutrition Hx: Pt reports she was primarily only eating cottage cheese with mandarin oranges prior to admission. She would have have this 4x daily and drink Ensure (believes it was Ensure plus) ~TID. She stated she didn't eat much else for weeks. Reports that eating was "awful" after the surgery and had only been able to eat very small  amounts.    Food allergies: NKFA    Current diet: NPO  Supplements: none     Nutrition Focused Physical Exam:  Edema: +1 BLE   Abdomen: rounded, tenderness/guarding, hypoactive BS, -flatus   Skin: surgical wound to abdomen   Body fat stores: adequate   Lean body mass stores: adequate     Anthropometrics:  Height: 170.2 cm (5\' 7" )    Current Weight: 72 kg (158 lb 11.7 oz); 99% IBW  UBW: 71.4-74.1 kg  Ideal Body Weight: 72.4 kg + 10% adjusted for age   BMI: 24.9 kg/(m^2) normal range   Weight Hx: no significant changes noted     04/08/2017 72 kg 158 lbs 12 oz Stated   03/29/2017 72 kg 158 lbs 12 oz Actual   03/06/2017 72.349 kg 159 lbs 8 oz --   02/12/2017 71.618 kg 157 lbs 14 oz --   01/11/2017 74.435 kg 164 lbs 2 oz --   12/14/2016 73.256 kg 161 lbs 8 oz --   11/16/2016 69.899 kg 154 lbs 2 oz --   10/05/2016 71.3 kg 157 lbs 3 oz Actual       Estimated Nutrient Needs: (Based on 72 kg)    1800-2160 kcal/day (25-30 kcal/kg)   86-108 g protein/day (1.2-1.5  g/kg)    1800-2160 mL fluid/day (25-30 mL/kg) +extraordinary losses       Nutrition Assessment and Diagnosis:     Familiar to nutrition services from previous admission over the summer, had been diagnosed with moderate malnutrition and struggled with adequate PO intake. Was ordered for Ensure Enlive. Per pt, she reported only eating cottage cheese, mandarin oranges and Ensure for weeks prior to surgery. Since surgery, pt has had very poor po tolerance and nausea. Decadron use, BGs as noted above, will continue to monitor. KUB shows gastric distension, possible UGI study in the near future. High NGT output noted. Discussed TPN with pt and answered questions.     TPN Initiation  The dietitian and Clinical Nutrition Specialist agrees TPN is appropriate for this patient.  Original Start Date: 2/5  D/C Plan: TPN will be discontinued once pt tolerating po diet/enteral feeds        Malnutrition Status: Pt with evidence of moderate malnutrition related to acute illness as evidenced by  insufficient energy intake (<75% EEE x >7 days), localized or generalized fluid accumulation that may sometimes mask weight loss. Despite BMI of 24.9 WNL and stable weight, the patient still meets the criteria for malnutrition based on the consensus statement from the Academy of Nutrition and Dietetics and the [place] of Parenteral and Enteral Nutrition.       Nutrition Intervention:   Please check and replete K+/Phos/Mg++ to WNL with IV replacements prior to starting TPN.   Continue to monitor K+/Phos/Mg++ daily as TPN is advanced to goal. Replete outside of TPN, as needed.  Once central access is obtained and electrolytes are WNL, recommend TPN (2 in 1) Adult Continuous plus Fat (central infusion site) with dextrose advanced to goal over 3 days:    Day 1: Rate: 75 mL/hr     Volume (Amino Acid/Dextrose solution): 1800 mL/day     Amino acids: 53 g/L      Dextrose: 76 g/L      Standard additives:      100 mg thiamine       1 mg folic acid       Balance Chloride:Acetate     Insulin: 0 units/LITER    Fat Emulsion 20% infusion: 60 gm/day (to be infused over 12 hours)   Day 2: If BGs/electrolytes are acceptable, advance dextrose to 122 g/L   Day 3: If BGs/electrolytes are acceptable, advance dextrose to 167 g/L (goal)  -TPN, at goal, will provide 2004 kcal/day, 95 g protein/day, 301 g dextrose/day and 2100 mL total fluid/day (including amino acid/dextrose solution and lipid volume).   4.   Please adjust or D/C IVF once TPN is initiated (TPN will provide for baseline fluid needs). Continue to monitor fluid balance and provide non-dextrose containing IVF to replace any extraordinary losses.  5.   Please order TPN Monitoring Panel.  6.   Advance diet as tolerated       Nutrition Monitoring/Evaluation:   1. Will monitor nutrition-related labs, weight trend, BM pattern.   2. Nutrition to follow up per high nutrition risk protocol.    Laurence Aly, [place]  Pager [phone]

## 2017-04-09 NOTE — Progress Notes (Addendum)
Surgical Oncology/Hepatobiliary Surgery Progress Note     LOS: 11 days     Subjective:  No events.  Pain controlled.  No nausea.  + flatus    Objective:    Vitals Sign Ranges for Past 24 Hours:  BP: (106-134)/(59-80)   Temp:  [36.1 C (97 F)-37 C (98.6 F)]   Temp src: Temporal (02/05 0507)  Heart Rate:  [65-83]   Resp:  [16]   SpO2:  [92 %-98 %]   Weight:  [72 kg (158 lb 11.7 oz)]       Physical Exam:    General Appearance: alert, no distress  HEENT: face symmetric, NGT with brown output  Cardiac: RRR  Respiratory: non-labored  Abdomen: soft, mild distention. Generalized tenderness, midline incision c/d/i, pre-existing abdominal wall ecchyosis  Genitourinary: no foley  Extremities: warm, well-perfused, 1+ edema bilaterally  Neuro: alert and oriented to place, time, and self    Labs:    CBC:    Recent Labs  Lab 04/09/17  0514 04/08/17  0125 04/07/17  0155 04/06/17  0036 04/05/17  0028 04/04/17  0045   WBC 10.2* 10.5* 10.0 8.8 10.1* 12.1*   Hemoglobin 8.6* 8.7* 8.5* 8.1* 8.7* 9.8*   Hematocrit 27* 27* 27* 26* 28* 30*   Platelets 370 360 363 349 329 453       Metabolic Panel:    Recent Labs  Lab 04/09/17  0514 04/08/17  0125 04/07/17  0155 04/06/17  0036 04/05/17  0028 04/04/17  0045   Sodium 138 138 131* 138 135 134   Potassium 4.3 4.2 3.3 4.0 4.5 4.2   Chloride 103 101 95* 99 96 92*   CO2 23 27 24 25 25 26    UN 5* 10 12 20 20 14    Creatinine 0.95 0.83 0.81 0.97* 0.90 0.89   Glucose 110* 109* 471* 69 82 100*   Calcium 8.1* 8.3* 7.6* 8.0* 8.4* 8.6   Magnesium 1.8 2.1 1.6 2.0 1.9 1.8   Phosphorus 3.0 3.1 3.1 3.2 4.3 4.8*          Assessment:    72 y.o. female with hx/o recent PE and pancreatic cancer POD # 11  status post whipple procedure. NGT replaced 04/04/17.    Plan:    -- Start TPN (per Dr. Ron Agee ok through existing line/port)  -- Possible UGI soon  -- Continue NGT to LIWS  -- Continue reglan  -- NPO. Swithed to NS form 1/2NS given hyponatremia  -- Continue therapeutic lovenox d/t recent hx/o PE  -- Appreciate  APS recs  -- PT  -- Dispo: awaiting ROBF    Donata Duff, MD  04/09/2017     6:37 AM  General Surgery Resident    HPB and GI Surgery Attending:    I saw and evaluated the patient. I have reviewed and edited the resident's/fellow's note and confirm the findings and plan of care as documented above. Comfortable. Having some flatus.    Charolotte Eke, MD

## 2017-04-09 NOTE — Progress Notes (Addendum)
Palliative Care Progress Note    Subjective: She feels quite poorly today. She feels quite down, and feels that she hasn't gotten any better in the last several days; she has a complete lack of energy. She was able to walk once around the unit but felt drained afterwards. She sees other patients and perceives them to be doing much better, "like they're training for a marathon." She feels depressed and had thoughts that she would be better off dead-- but states that she doesn't want to be dead. She is worried about the TPN starting today.    She continues to have abdominal discomfort but denies frank pain. She continues to sleep poorly, but isn't sure why-- she's not having pain or anxiety waking her from sleep. Remainder of sx as reflected in the ROS below.    Palliative Care ROS:  Pain   Mild  Nausea   None  Anorexia   Moderate  Anxiety   None  Depression   Severe  Shortness of Breath   None  Tiredness/Fatigue   Severe  Drowsiness/Sleepiness   Mild  Airway Secretions   no  Constipation   yes    Physical Examination:   BP: (112-134)/(68-80)   Temp:  [36.2 C (97.2 F)-37 C (98.6 F)]   Temp src: Temporal (02/05 0734)  Heart Rate:  [65-83]   Resp:  [16]   SpO2:  [94 %-98 %]   Weight:  [73.5 kg (162 lb)]   BP 112/70 (BP Location: Left arm)    Pulse 78    Temp 36.8 C (98.2 F) (Temporal)    Resp 16    Ht 1.702 m (5\' 7" )    Wt 73.5 kg (162 lb)    SpO2 96%    BMI 25.37 kg/m     General Appearance: Alert, cooperative, pleasant, no distress, appears stated age  HEENT: Conjunctivae clear, external ear canals normal bilaterally, lips, mucosa, and tongue normal; teeth and gums normal  Neck: Supple  Lungs: Clear to auscultation bilaterally, respirations unlabored  Heart: Regular rate and rhythm, S1 and S2 normal, no murmur, rub or gallop, 2+ radial and PT pulses bilaterally, no JVD  Abdomen: Soft, tender to minimal palpation, bowel sounds active all four quadrants, no masses, no organomegaly  Extremities: Extremities  normal, atraumatic, trace pedal edema bilaterally improved from yesterday, no cyanosis  Skin: Skin color, texture, turgor normal, no rashes or lesions visualized    Assessment/Plan:  - Continue current hydromorphone PCA (0.5 mg demand dose q20 minutes without basal rate; yesterday's 24 hour use was 12.5 mg, down from 16 day prior), APAP ATC  - Continue IV methadone at 5 mg QAM and QHS and 7.5 mg at midday; anticipate some of pt's lethargy is secondary to increased effect of now more bioavailable methadone  - For insomnia, consider transition to IV Ativan for increased bioavailability  - Pt's depression is understandable-- talking seemed to help her a little bit and she has good family support-- but she is going through a very tough time. Pt thinks she would enjoy another visit from the chaplain  - Continue bowel regimen  - Remainder of care per primary team    Gus Rankin, MD  04/09/17 3:39 PM     [place]    Patient seen on 04/09/2017 at 1508  Reason for Visit: Pancreatic adenocarcinoma; Upper abdominal pain; Nausea; Insomnia; Debility; Depressed mood    Attestation: I saw and examined the patient, discussed the case with Dr. Gus Rankin, and  agree with or amend her/his assessment and plan as documented 04/09/2017, as follows:    HPI:  I reviewed this patient, reviewed the resident's note and agree.  Upper abdominal pain, "discomfort" quality, 2/10 severity, decreased with pain medication. Today associated drowsiness and fatigue.    Exam:   I examined this patient, reviewed the resident's note and agree.    Medical Decision Making and Plan:   I discussed with the resident her/his documented decision making and agree.    Pancreatic adenocarcinoma; Upper abdominal pain; LUQ abdominal pain: Possible poor enteric medication bioavailability with high NG tube output. Continue IV methadone. Continue hydromorphone IV PCA.    Nausea: Continue NGT to suction. Continue metoclopramide IV, prn  ondansetron IV.    Insomnia:  Start lorazepam 0.5mg  IV qhs prn.    Debility: Continue Physical Therapy.     Depressed mood: Likely situational. Patient would appreciate return visit by hospital chaplain.    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    Acute blood loss anemia D62    Hypothyroidism E03.9    Pancreatic cancer C25.9    Anemia D64.9    Acute kidney insufficiency N28.9     hx of Pulmonary embolism I26.99     No Known Allergies (drug, envir, food or latex)    Scheduled Meds:    methadone IV  5 mg Intravenous BID    And    methadone IV  7.5 mg Intravenous Daily    acetaminophen  1,000 mg Intravenous Q8H    metoclopramide IV  10 mg Intravenous Q8H    enoxaparin  1 mg/kg Subcutaneous Q12H    dexamethasone  4 mg Oral Daily    pantoprazole  40 mg Intravenous Q24H    insulin regular  0-20 Units Subcutaneous Q6H    levothyroxine  137 mcg Oral Daily    amitriptyline  25 mg Oral Nightly    escitalopram  20 mg Oral Nightly     Continuous Infusions:    TPN (2 in 1) Adult Continuous      And    fat emulsion soybean oil      dextrose 5% and 0.9% NaCl with KCl 20 mEq 100 mL/hr (04/09/17 1017)    HYDROmorphone       PRN Meds:  LORazepam, phenol, ondansetron, naloxone

## 2017-04-09 NOTE — Plan of Care (Signed)
Bowel Elimination     Elimination patterns are normal or improving Maintaining        Nutrition     Patient's nutritional status is maintained or improved Maintaining        Post-Operative Bowel Elimination     Elimination pattern is normal or improving Maintaining        Post-Operative Complications     Patient will remain free from symptoms of infection-post op Maintaining        Post-Operative Hemodynamic Stability     Maintain Hemodynamic Stability Maintaining          Cognitive function     Cognitive function will be maintained Progressing towards goal        Mobility     Functional status is maintained or improved - Geriatric Progressing towards goal        Pain/Comfort     Patient's pain or discomfort is manageable Progressing towards goal        Safety     Patient will remain free of falls Progressing towards goal

## 2017-04-10 ENCOUNTER — Other Ambulatory Visit: Payer: Self-pay | Admitting: Psychiatry

## 2017-04-10 DIAGNOSIS — R9431 Abnormal electrocardiogram [ECG] [EKG]: Secondary | ICD-10-CM

## 2017-04-10 LAB — COMPREHENSIVE METABOLIC PANEL
ALT: 70 U/L — ABNORMAL HIGH (ref 0–35)
AST: 103 U/L — ABNORMAL HIGH (ref 0–35)
Albumin: 2.9 g/dL — ABNORMAL LOW (ref 3.5–5.2)
Alk Phos: 198 U/L — ABNORMAL HIGH (ref 35–105)
Anion Gap: 13 (ref 7–16)
Bilirubin,Total: 0.3 mg/dL (ref 0.0–1.2)
CO2: 21 mmol/L (ref 20–28)
Calcium: 7.8 mg/dL — ABNORMAL LOW (ref 8.6–10.2)
Chloride: 98 mmol/L (ref 96–108)
Creatinine: 0.85 mg/dL (ref 0.51–0.95)
GFR,Black: 80 *
GFR,Caucasian: 69 *
Glucose: 124 mg/dL — ABNORMAL HIGH (ref 60–99)
Lab: 7 mg/dL (ref 6–20)
Potassium: 3.9 mmol/L (ref 3.3–5.1)
Sodium: 132 mmol/L — ABNORMAL LOW (ref 133–145)
Total Protein: 5.1 g/dL — ABNORMAL LOW (ref 6.3–7.7)

## 2017-04-10 LAB — CBC
Hematocrit: 26 % — ABNORMAL LOW (ref 34–45)
Hemoglobin: 8.2 g/dL — ABNORMAL LOW (ref 11.2–15.7)
MCH: 31 pg/cell (ref 26–32)
MCHC: 32 g/dL (ref 32–36)
MCV: 97 fL — ABNORMAL HIGH (ref 79–95)
Platelets: 331 10*3/uL (ref 160–370)
RBC: 2.7 MIL/uL — ABNORMAL LOW (ref 3.9–5.2)
RDW: 18.2 % — ABNORMAL HIGH (ref 11.7–14.4)
WBC: 10.6 10*3/uL — ABNORMAL HIGH (ref 4.0–10.0)

## 2017-04-10 LAB — EKG 12-LEAD
P: 0 deg
PR: 184 ms
QRS: 27 deg
QRSD: 70 ms
QT: 380 ms
QTc: 422 ms
Rate: 74 {beats}/min
Severity: NORMAL
T: 24 deg

## 2017-04-10 LAB — GLUCOSE, WHOLE BLOOD: Glucose,WB: 129 mg/dL — ABNORMAL HIGH (ref 60–99)

## 2017-04-10 LAB — POCT GLUCOSE
Glucose POCT: 122 mg/dL — ABNORMAL HIGH (ref 60–99)
Glucose POCT: 128 mg/dL — ABNORMAL HIGH (ref 60–99)
Glucose POCT: 134 mg/dL — ABNORMAL HIGH (ref 60–99)
Glucose POCT: 139 mg/dL — ABNORMAL HIGH (ref 60–99)

## 2017-04-10 LAB — PHOSPHORUS: Phosphorus: 2.8 mg/dL (ref 2.7–4.5)

## 2017-04-10 LAB — MAGNESIUM: Magnesium: 1.9 mg/dL (ref 1.6–2.5)

## 2017-04-10 MED ORDER — ACETAMINOPHEN 10 MG/ML IV SOLN *I*
1000.0000 mg | Freq: Three times a day (TID) | INTRAVENOUS | Status: AC
Start: 2017-04-10 — End: 2017-04-11
  Administered 2017-04-10 – 2017-04-11 (×3): 1000 mg via INTRAVENOUS
  Filled 2017-04-10 (×3): qty 100

## 2017-04-10 MED ORDER — MAGNESIUM SULFATE 2 GM IN 50 ML *WRAPPED*
2000.0000 mg | Freq: Once | INTRAVENOUS | Status: AC
Start: 2017-04-10 — End: 2017-04-10
  Administered 2017-04-10: 2000 mg via INTRAVENOUS
  Filled 2017-04-10: qty 50

## 2017-04-10 MED ORDER — PHENOL 1.4 % MT LIQD *I*
2.0000 | OROMUCOSAL | Status: AC | PRN
Start: 2017-04-10 — End: 2017-04-17
  Filled 2017-04-10: qty 20

## 2017-04-10 MED ORDER — ERYTHROMYCIN ETHYLSUCCINATE 200 MG/5ML PO SUSR *I*
200.0000 mg | Freq: Four times a day (QID) | ORAL | Status: DC
Start: 2017-04-10 — End: 2017-04-14
  Administered 2017-04-10 – 2017-04-14 (×16): 200 mg via ORAL
  Filled 2017-04-10 (×26): qty 5

## 2017-04-10 MED ORDER — THIAMINE HCL 100 MG/ML IJ SOLN *I*
INTRAVENOUS | Status: AC
Start: 2017-04-10 — End: 2017-04-11
  Filled 2017-04-10: qty 636

## 2017-04-10 MED ORDER — FAT EMULSION SOYBEAN OIL 20 % IV EMUL *WRAPPED*
60.0000 g | Freq: Every day | INTRAVENOUS | Status: AC
Start: 2017-04-10 — End: 2017-04-11
  Administered 2017-04-10: 60 g via INTRAVENOUS
  Filled 2017-04-10: qty 300

## 2017-04-10 MED ORDER — POTASSIUM CHLORIDE 10 MEQ/50ML IV SOLN *I*
10.0000 meq | Freq: Once | INTRAVENOUS | Status: AC
Start: 2017-04-10 — End: 2017-04-10
  Administered 2017-04-10: 10 meq via INTRAVENOUS
  Filled 2017-04-10: qty 50

## 2017-04-10 NOTE — Progress Notes (Signed)
Surgical Oncology/Hepatobiliary Surgery Progress Note     LOS: 12 days     Subjective:  No events.  Pain controlled. Slept well.  No nausea.  + flatus    Objective:    Vitals Sign Ranges for Past 24 Hours:  BP: (110-120)/(60-70)   Temp:  [36 C (96.8 F)-36.8 C (98.2 F)]   Temp src: Temporal (02/06 0417)  Heart Rate:  [70-78]   Resp:  [16]   SpO2:  [93 %-96 %]   Weight:  [73.5 kg (162 lb)]       Physical Exam:    General Appearance: alert, no distress  HEENT: face symmetric, NGT with brown output  Cardiac: RRR  Respiratory: non-labored  Abdomen: soft, minimally disetnded. Generalized tenderness, midline incision c/d/i, pre-existing abdominal wall ecchyosis  Genitourinary: no foley  Extremities: warm, well-perfused, 1+ edema bilaterally  Neuro: alert and oriented to place, time, and self    Labs:    CBC:    Recent Labs  Lab 04/10/17  0015 04/09/17  0514 04/08/17  0125 04/07/17  0155 04/06/17  0036 04/05/17  0028   WBC 10.6* 10.2* 10.5* 10.0 8.8 10.1*   Hemoglobin 8.2* 8.6* 8.7* 8.5* 8.1* 8.7*   Hematocrit 26* 27* 27* 27* 26* 28*   Platelets 331 370 360 363 349 161       Metabolic Panel:    Recent Labs  Lab 04/10/17  0015 04/09/17  1711 04/09/17  0514 04/08/17  0125 04/07/17  0155 04/06/17  0036 04/05/17  0028   Sodium 132* 133 138 138 131* 138 135   Potassium 3.9 4.2 4.3 4.2 3.3 4.0 4.5   Chloride 98 101 103 101 95* 99 96   CO2 21 22 23 27 24 25 25    UN 7 5* 5* 10 12 20 20    Creatinine 0.85 0.84 0.95 0.83 0.81 0.97* 0.90   Glucose 124* 119* 110* 109* 471* 69 82   Calcium 7.8* 7.9* 8.1* 8.3* 7.6* 8.0* 8.4*   Magnesium 1.9  --  1.8 2.1 1.6 2.0 1.9   Phosphorus 2.8  --  3.0 3.1 3.1 3.2 4.3          Assessment:    72 y.o. female with hx/o recent PE and pancreatic cancer POD # 12  status post whipple procedure. NGT replaced 04/04/17.    Plan:    -- Continue TPN (per Dr. Ron Agee ok through existing line/port)  -- UGI likely Friday if NGT output still high  -- Continue NGT to LIWS  -- Continue reglan; add erythromycin  --  NPO. Swithed to NS form 1/2NS given hyponatremia  -- Continue therapeutic lovenox d/t recent hx/o PE  -- Appreciate APS recs  -- PT  -- Dispo: awaiting ROBF    Donata Duff, MD  04/10/2017     6:37 AM  General Surgery Resident

## 2017-04-10 NOTE — Progress Notes (Signed)
SPIRITUAL ASSESSMENT NOTE      The visit was initiated by an on-call page. When I arrived, the patient asked to have the visit delayed by a day. A referral will be submitted to the unit chaplain for a visit tomorrow if time permits.    Amanda Galloway      6:00 PM on 04/10/2017

## 2017-04-10 NOTE — Progress Notes (Signed)
Amanda Galloway currently requires stand by assistance for stair navigation. Anticipate patient will need 1 more PT visits to allow return to prior living environment. Discharge Recommendations: Anticipate return to prior living arrangement, Home PT, Intermittent supervision/assist after discharge from the hospital.     PT Discharge Equipment Recommended: None     Physical Therapy Treatment Note:       04/10/17 1534   PT Tracking   PT Latta   Visit Number   Visit Number [facility]) / Treatment Day (HH) 3   Precautions/Observations   Precautions used Yes   LDA Observation IV lines;PCA   Fall Precautions General falls precautions   Other Pt up in recliner, daughter present - supportive t/o. Pt agreeable although reporting fatigue   Pain Assessment   *Is the patient currently in pain? Denies   Bed Mobility   Bed mobility Not tested   Additional comments Previously demonstrated independence   Transfers   Transfers Tested   Sit to Stand Independent   Stand to sit Independent   Transfer Assistive Device none   Additional comments Pt completed from recliner chair, demonstrated no overt loss of balance upon initial standing.   Mobility   Mobility Tested   Gait Pattern Increased cadence   Ambulation Assist Modified independent (device);Stand by;1 person assist  (independent with IV pole support; SBA without AD)   Ambulation Distance (Feet) 542ft   Ambulation Assistive Device None;Other (comment)  (safely able to manage own IV pole)   Stairs Assistance Stand by;1 person assist   Stair Management Technique No rail;Step to pattern;Forwards   Number of Stairs 4 x 4 steps   Additional comments Pt ambulated at fast gait speed requiring VCs and tactile cues anteriorly for slower pace and maintaining balance. Pt demonstrtaed independence with ambulation while managing own IV pole; without support required SBA for safety. Pt able to complete stairs with progressing to SBA without railing utilizing step to strategy    Therapeutic Exercises   Additional comments Educated on continued ambulation to promote good mobility.   Balance   Balance Tested   Sitting - Static Independent    Standing - Static Independent;Unsupported   Standing - Dynamic Independent;Supported  (SBA unsupported)   Additional Comments Pt maintained dynamic standing balance !5 minutes at end of session.   PT AM-PAC Mobility   Turning over in bed? 4   Sitting down on and standing up from a chair with arms? 4   Moving from lying on back to sitting on the side of the bed? 4   Moving to and from a bed to a chair? 4   Need to walk in hospital room? 3   Climbing 3 - 5 steps with a railing? 3   Total Raw Score 22   Standardized Score 53.28   CMS 1-100% Score 21   Medicare Functional Limit Modifiers CJ  20 - < 40% impaired, limited, restricted   Assessment   Brief Assessment Remains appropriate for skilled therapy   Problem List Impaired balance;Impaired LE strength;Impaired stair navigation   Patient / Family Goal home soon   Plan/Recommendation   Treatment Interventions Restorative PT   PT Frequency 2-4x/wk   Mobility Recommendations independent with IV pole; SBA without AD. Please encourage ambulation 3x/day   Discharge Recommendations Anticipate return to prior living arrangement;Home PT;Intermittent supervision/assist   PT Discharge Equipment Recommended None   Assessment/Recommendations Reviewed With: Patient;Nursing;Family;Care coordinator   Next PT Visit progressive stair training, clear for discharge if appropriate  Time Calculation   PT Timed Codes 19   PT Untimed Codes 0   PT Unbilled Time 0   PT Total Treatment 19   Plan and Onset date   Plan of Care Date 04/01/17   Treatment Start Date 04/01/17   PT Charges   $PT [facility] Charges Functional Training - code 0239 (48min)       Rodena Piety, PT, DPT  Pager [phone]

## 2017-04-10 NOTE — Plan of Care (Signed)
Bowel Elimination     Elimination patterns are normal or improving Maintaining        Cognitive function     Cognitive function will be maintained Maintaining        Mobility     Functional status is maintained or improved - Geriatric Maintaining        Nutrition     Patient's nutritional status is maintained or improved Maintaining        Pain/Comfort     Patient's pain or discomfort is manageable Maintaining        Post-Operative Bowel Elimination     Elimination pattern is normal or improving Maintaining        Post-Operative Complications     Patient will remain free from symptoms of infection-post op Maintaining        Post-Operative Hemodynamic Stability     Maintain Hemodynamic Stability Maintaining        Safety     Patient will remain free of falls Maintaining

## 2017-04-10 NOTE — Progress Notes (Addendum)
Palliative Care Progress Note    Overnight events: Started on total parenteral nutrition.    Subjective: Today Jerah is feeling quite a bit better. She attributes this to getting a lot of sleep last night-- had about six hours unbroken sleep followed by a few hours more after getting lab work done. She did receive IV lorazepam last night which she thinks helped a lot. Her mood is much better, and she is looking forward to both of her daughters visiting her today.    She continues to have the same abdominal discomfort as before-- L side somewhat greater than R, but still a 1-2 only on the pain scale. She still feels quite drained, tired, and sleepy. She passed gas yesterday but has not today.    Palliative Care ROS:  Pain   Mild  Nausea   None  Anorexia   Moderate  Anxiety   None  Depression   None  Shortness of Breath   None  Tiredness/Fatigue   Severe  Drowsiness/Sleepiness   Moderate  Airway Secretions   no  Constipation   yes    Physical Examination:   BP: (110-120)/(60-78)   Temp:  [36 C (96.8 F)-36.6 C (97.9 F)]   Temp src: Temporal (02/06 0813)  Heart Rate:  [70-76]   Resp:  [16]   SpO2:  [93 %-96 %]   Weight:  [73.5 kg (162 lb)]   BP 118/78 (BP Location: Left arm)    Pulse 76    Temp 36.5 C (97.7 F) (Temporal)    Resp 16    Ht 1.702 m (5\' 7" )    Wt 73.5 kg (162 lb)    SpO2 96%    BMI 25.37 kg/m     General Appearance: Alert, cooperative, pleasant, no distress, appears stated age  HEENT: Conjunctivae clear, external ear canals normal bilaterally, lips, mucosa, and tongue normal; teeth and gums normal  Neck: Supple  Lungs: Clear to auscultation bilaterally, respirations unlabored  Heart: Regular rate and rhythm, S1 and S2 normal, no murmur, rub or gallop, 2+ radial and PT pulses bilaterally, no JVD  Abdomen: Soft, tender to minimal palpation, bowel sounds active all four quadrants, no masses, no organomegaly  Extremities: Extremities normal, atraumatic, trace pedal edema bilaterally, no cyanosis  Skin:  Skin color, texture, turgor normal, no rashes or lesions visualized    Assessment/Plan:  72 y/o female recovering from pancreatic Whipple procedure, pain control and sleep quality both improving with treatment.    - Continue current hydromorphone PCA (0.5 mg demand dose q20 minutes without basal rate; yesterday's 24 hour use was 5.5 mg, down from 12.5 day prior), APAP ATC. The decreased hydromorphone use is likely secondary to both improved absorption of methadone, and improved sleep.  - Continue IV methadone at 5 mg QAM and QHS and 7.5 mg at midday; anticipate some of pt's lethargy is secondary to increased effect of now more bioavailable methadone  - Appreciate transition to IV Ativan as this seems to have patient's sleep immensely  - Pt's depression improved today, likely secondary to improved sleep-- continue to monitor. Chaplain visit may still be helpful  - Notice pt's docusate and senna orders fell off-- from a comfort standpoint pt may benefit from resumption of regular bowel regimen. Consider daily PEG and/or senna  - Remainder of excellent care per primary team    Gus Rankin, MD  04/10/17 10:10 AM     PALLIATIVE CARE ATTENDING    Patient seen on 04/10/2017 at 1457  Reason for Visit: Pancreatic adenocarcinoma; Upper abdominal pain; Nausea; Insomnia; Debility; Depressed mood    Attestation: I saw and examined the patient, discussed the case with Dr. Gus Rankin, and agree with or amend her/his assessment and plan as documented 04/10/2017, as follows:    HPI:  I reviewed this patient, reviewed the resident's note and agree.  Abdominal pain decreased with IV methadone, IV hydromorphone PCA.  Increased mobility and activity today, ambulating around floor twice.    Exam:   I examined this patient, reviewed the resident's note and agree.  Smiling. Appears comfortable.    Medical Decision Making and Plan:   I discussed with the resident her/his documented decision making and agree.    Pancreatic  adenocarcinoma; Upper abdominal pain:Possible poor enteric medication bioavailability with high NG tube output; continue IV methadone. Continue hydromorphone IV PCA.    Nausea: Continue NGT to suction. Continue metoclopramide IV, prn ondansetron IV.    Insomnia:  Continue lorazepam 0.5mg  IV qhs prn.    Debility: Improving. Continue Physical Therapy.     Depressed mood: Improved mood today. Likely frustration and situational depression yesterday. Patient would appreciate return visit by hospital chaplain; text page has been sent with request.    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    Acute blood loss anemia D62    Hypothyroidism E03.9    Pancreatic cancer C25.9    Anemia D64.9    Acute kidney insufficiency N28.9     hx of Pulmonary embolism I26.99     No Known Allergies (drug, envir, food or latex)    Scheduled Meds:    acetaminophen  1,000 mg Intravenous Q8H    erythromycin ethylsuccinate  200 mg Oral Q6H    methadone IV  5 mg Intravenous BID    And    methadone IV  7.5 mg Intravenous Daily    metoclopramide IV  10 mg Intravenous Q8H    enoxaparin  1 mg/kg Subcutaneous Q12H    dexamethasone  4 mg Oral Daily    pantoprazole  40 mg Intravenous Q24H    insulin regular  0-20 Units Subcutaneous Q6H    levothyroxine  137 mcg Oral Daily    amitriptyline  25 mg Oral Nightly    escitalopram  20 mg Oral Nightly     Continuous Infusions:    TPN (2 in 1) Adult Continuous      And    fat emulsion soybean oil      TPN (2 in 1) Adult  Continuous 75 mL/hr at 04/09/17 1737    HYDROmorphone       PRN Meds:  phenol, LORazepam, ondansetron, naloxone

## 2017-04-10 NOTE — Consults (Signed)
Medical Nutrition Therapy Brief Note: TPN check    Patient remains on TPN for nutrition support. Chart, labs, medications / TPN order, I/Os reviewed. UGI likely on Friday. Na 132 (L), K+ 3.9 (WNL), Cl 98 (WNL), CO2 21 (WNL), Ca 8.68 (WNL corrected for low albumin), PO4 2.8 (WNL), Mg 1.9 (WNL). Recent t bili 0.3 (WNL) from 2/6. Recent TGs 153 (WNL) from 2/5. BGs ranging from 108 - 139 over the past 24 hours.  MAR reviewed with repletions noted for Mg Sulfate, KCl. I/Os significant for 1350 ml UOP, -1875 ml emesis. Current TPN order reviewed - please see recommendations below for changes.    Recommendations:  1.  Adult Continuous plus Fat (central infusion site):     Rate: 75 mL/hr     Volume (Amino Acid/Dextrose solution): 1800 mL/day    Amino acids: 53 g/L     Dextrose: 122 g/L     Additives as follows:     Na: 76 mEq/L     K: 22 mEq/L     Ca: 4.5 mEq/L     Mg: 5 mEq/L     PO4: 12 mmol/L     100 mg thiamine                                        1 mg folic acid      Balance Chloride:Acetate    Insulin: 0 units/LITER   Fat Emulsion 20% infusion: 60 gm/day (to be infused over 12 hours)  2.    Continue to monitor K+/Phos/Mg++ daily as TPN is advanced to goal. Replete outside of TPN, as needed.  3.    Continue to adjust or D/C IVF once TPN is initiated (TPN will provide for baseline fluid needs). Continue to monitor fluid balance and provide non-dextrose containing IVF to replace any extraordinary losses.      Will continue to monitor.     Laurence Aly, [place]  Pager [phone]

## 2017-04-11 LAB — COMPREHENSIVE METABOLIC PANEL
ALT: 89 U/L — ABNORMAL HIGH (ref 0–35)
Albumin: 2.9 g/dL — ABNORMAL LOW (ref 3.5–5.2)
Alk Phos: 236 U/L — ABNORMAL HIGH (ref 35–105)
Anion Gap: 12 (ref 7–16)
Bilirubin,Total: <0.2 mg/dL (ref 0.0–1.2)
CO2: 22 mmol/L (ref 20–28)
Calcium: 8.1 mg/dL — ABNORMAL LOW (ref 8.6–10.2)
Chloride: 99 mmol/L (ref 96–108)
Creatinine: 0.76 mg/dL (ref 0.51–0.95)
GFR,Black: 91 *
GFR,Caucasian: 79 *
Glucose: 123 mg/dL — ABNORMAL HIGH (ref 60–99)
Lab: 14 mg/dL (ref 6–20)
Potassium: 4.1 mmol/L (ref 3.3–5.1)
Sodium: 133 mmol/L (ref 133–145)
Total Protein: 5.5 g/dL — ABNORMAL LOW (ref 6.3–7.7)

## 2017-04-11 LAB — CBC
Hematocrit: 25 % — ABNORMAL LOW (ref 34–45)
Hemoglobin: 7.9 g/dL — ABNORMAL LOW (ref 11.2–15.7)
MCH: 30 pg/cell (ref 26–32)
MCHC: 32 g/dL (ref 32–36)
MCV: 95 fL (ref 79–95)
Platelets: 343 10*3/uL (ref 160–370)
RBC: 2.6 MIL/uL — ABNORMAL LOW (ref 3.9–5.2)
RDW: 18 % — ABNORMAL HIGH (ref 11.7–14.4)
WBC: 9.4 10*3/uL (ref 4.0–10.0)

## 2017-04-11 LAB — MAGNESIUM: Magnesium: 2.1 mg/dL (ref 1.6–2.5)

## 2017-04-11 LAB — POCT GLUCOSE
Glucose POCT: 106 mg/dL — ABNORMAL HIGH (ref 60–99)
Glucose POCT: 119 mg/dL — ABNORMAL HIGH (ref 60–99)
Glucose POCT: 121 mg/dL — ABNORMAL HIGH (ref 60–99)
Glucose POCT: 124 mg/dL — ABNORMAL HIGH (ref 60–99)
Glucose POCT: 141 mg/dL — ABNORMAL HIGH (ref 60–99)

## 2017-04-11 LAB — PHOSPHORUS: Phosphorus: 3.5 mg/dL (ref 2.7–4.5)

## 2017-04-11 MED ORDER — FAT EMULSION SOYBEAN OIL 20 % IV EMUL *WRAPPED*
60.0000 g | Freq: Every day | INTRAVENOUS | Status: AC
Start: 2017-04-11 — End: 2017-04-12
  Administered 2017-04-11: 60 g via INTRAVENOUS
  Filled 2017-04-11: qty 300

## 2017-04-11 MED ORDER — THIAMINE HCL 100 MG/ML IJ SOLN *I*
INTRAVENOUS | Status: AC
Start: 2017-04-11 — End: 2017-04-12
  Filled 2017-04-11: qty 636

## 2017-04-11 NOTE — Plan of Care (Signed)
Bowel Elimination     Elimination patterns are normal or improving Maintaining        Cognitive function     Cognitive function will be maintained Maintaining        Mobility     Functional status is maintained or improved - Geriatric Maintaining        Nutrition     Patient's nutritional status is maintained or improved Maintaining        Pain/Comfort     Patient's pain or discomfort is manageable Maintaining        Post-Operative Bowel Elimination     Elimination pattern is normal or improving Maintaining        Post-Operative Complications     Patient will remain free from symptoms of infection-post op Maintaining        Post-Operative Hemodynamic Stability     Maintain Hemodynamic Stability Maintaining        Safety     Patient will remain free of falls Maintaining

## 2017-04-11 NOTE — Progress Notes (Signed)
04/11/17 1100   [facility] Patient Class Review   Patient Class Review Inpatient   Patient Class Effective 03/29/2017  Amanda Galloway

## 2017-04-11 NOTE — Student Note (Signed)
Medical Nutrition Therapy Brief Note: TPN check    Patient remains on TPN for nutrition support. Chart, labs, medications / TPN order, I/Os reviewed. Na 133 (WNL), K+ 4.1 (WNL), Cl 99 (WNL), CO2 22 (WNL), Ca 8.1 (L), PO4 3.5 (WNL), Mg 2.1 (WNL). Recent t bili <0.2 (WNL) from 2/7. Recent TGs 153 (H) from 2/5.  BGs slightly elevated ranging from 121 - 134 over the past 24 hours.  MAR reviewed with no repletions since previous TPN check noted. I/Os significant for 1450 mL UOP yesterday, 675 mL emesis output yesterday. Current TPN order reviewed -please see recommendations below for changes.    Recommendations:  1.  Adult Continuous plus Fat (central infusion site):                Rate: 75 mL/hr                Volume (Amino Acid/Dextrose solution): 1800 mL/day                          Amino acids: 53 g/L                           Dextrose: 167 g/L                           Additives as follows:                                      Na: 76 mEq/L                                      K: 22 mEq/L                                      Ca: 4.5 mEq/L                                      Mg: 5 mEq/L                                      PO4: 12 mmol/L                                      100 mg thiamine   1 mg folic acid                                       Balance Chloride:Acetate                          Insulin: 0 units/LITER              Fat Emulsion 20% infusion: 60 gm/day (to be infused over 12 hours)  2.    Continue to monitor K+/Phos/Mg++ daily as TPN is advanced to  goal. Replete outside of TPN, as needed.  3.    Continue to adjust or D/C IVFonce TPN is initiated (TPN will provide for baseline fluid needs). Continue to monitor fluid balance and provide non-dextrose containing IVF to replace any extraordinary losses.      Will continue to monitor.     Currie Paris   Dietetic Intern   Pager [phone]

## 2017-04-11 NOTE — Progress Notes (Addendum)
Palliative Care Progress Note    Subjective: Amanda Galloway feels well today. She did not sleep as well last night as the previous night, but it has still been improved. This has helped her mood and she denies any depressive or anxious sx today. Family is visiting her again today which she is looking forward to. The woman who was with her two days ago was actually a very close friend, who Amanda Galloway calls her "adopted daughter." She has a biological daughter who lives locally and a son who lives in [place].    She continues to have the same abdominal discomfort as before-- L side somewhat greater than R, but still a 1-2 only on the pain scale. Her fatigue is a tiny bit better today. She has not been passing gas.    Palliative Care ROS:  Pain   Mild  Nausea   None  Anorexia   Moderate  Anxiety   None  Depression   None  Shortness of Breath   None  Tiredness/Fatigue   Severe  Drowsiness/Sleepiness   Moderate  Airway Secretions   no  Constipation   yes    Physical Examination:   BP: (96-120)/(58-70)   Temp:  [35.8 C (96.4 F)-36.3 C (97.3 F)]   Temp src: Temporal (02/07 0824)  Heart Rate:  [61-70]   Resp:  [16]   SpO2:  [91 %-97 %]   BP 118/60 (BP Location: Left arm)    Pulse 70    Temp 36.1 C (97 F) (Temporal)    Resp 16    Ht 1.702 m (5\' 7" )    Wt 73.5 kg (162 lb)    SpO2 97%    BMI 25.37 kg/m     General Appearance: Alert, cooperative, pleasant, no distress, appears stated age  HEENT: Conjunctivae clear, external ear canals normal bilaterally, lips, mucosa, and tongue normal; teeth and gums normal  Neck: Supple  Lungs: Clear to auscultation bilaterally, respirations unlabored  Heart: Regular rate and rhythm, S1 and S2 normal, no murmur, rub or gallop, 2+ radial and PT pulses bilaterally, no JVD  Abdomen: Soft, tender to minimal palpation, bowel sounds active all four quadrants, no masses, no organomegaly  Extremities: Extremities normal, atraumatic, trace pedal edema bilaterally, no cyanosis  Skin: Skin color, texture,  turgor normal, no rashes or lesions visualized    Assessment/Plan:  72 y/o female recovering from pancreatic Whipple procedure, pain control and sleep quality both improving with treatment.    - Continue current hydromorphone PCA (0.5 mg demand dose q20 minutes without basal rate; yesterday's 24 hour use was 16.5 mg, up from 5.5 day prior), APAP ATC. This should continue as long as pt remains NPO  - Continue IV methadone at 5 mg QAM and QHS and 7.5 mg at midday; anticipate some of pt's lethargy is secondary to increased effect of now more bioavailable methadone  - Appreciate transition to IV Ativan as this seems to have patient's sleep immensely  - Pt's depression continues, likely secondary to improved sleep-- continue to monitor  - Notice pt's docusate and senna orders fell off-- from a comfort standpoint pt may benefit from resumption of regular bowel regimen. Consider daily PEG, senna, or bisacodyl suppository  - Remainder of excellent care per primary team    We will sign off from [facility] care at this time. We will be happy to see her again when she is taking PO, or as needed for worsening symptoms or any other questions. Thank you for allowing [name] to  participate in her care.    Gus Rankin, MD  04/11/17 9:52 AM     PALLIATIVE CARE ATTENDING    Patient seen on 04/11/2017 at 1542  Reason for Visit: Pancreatic adenocarcinoma; Upper abdominal pain; Nausea; Insomnia; Debility; Depressed mood    Attestation: I saw and examined the patient, discussed the case with Dr. Gus Rankin, and agree with or amend her/his assessment and plan as documented 04/11/2017, as follows:    HPI:  I reviewed this patient, reviewed the resident's note and agree.  Epigastric abdominal pain, 2/10 severity, constant, increased in certain positions, decreased with pain medication.    Exam:   I examined this patient, reviewed the resident's note and agree.    Medical Decision Making and Plan:   I discussed with the resident her/his  documented decision making and agree.    Pancreatic adenocarcinoma; Upper abdominal pain:Possible poor enteric medication bioavailability with high NG tube output; continue IV methadone. Continue hydromorphone IV PCA.    Nausea: Continue NGT to suction. Continue metoclopramide IV, prn ondansetron IV.    Insomnia:  Continue lorazepam 0.5mg  IV qhs prn.    Debility: Continue Physical Therapy.     Depressed mood: Improved mood. Likely frustration and situational depression yesterday. Patient would appreciate return visit by hospital chaplain; text page was sent with request yesterday.        We will sign off at this time. Please contact us when we may be of further service.          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    Acute blood loss anemia D62    Hypothyroidism E03.9    Pancreatic cancer C25.9    Anemia D64.9    Acute kidney insufficiency N28.9     hx of Pulmonary embolism I26.99     No Known Allergies (drug, envir, food or latex)    Scheduled Meds:    erythromycin ethylsuccinate  200 mg Oral Q6H    methadone IV  5 mg Intravenous BID    And    methadone IV  7.5 mg Intravenous Daily    metoclopramide IV  10 mg Intravenous Q8H    enoxaparin  1 mg/kg Subcutaneous Q12H    dexamethasone  4 mg Oral Daily    pantoprazole  40 mg Intravenous Q24H    insulin regular  0-20 Units Subcutaneous Q6H    levothyroxine  137 mcg Oral Daily    amitriptyline  25 mg Oral Nightly    escitalopram  20 mg Oral Nightly     Continuous Infusions:    TPN  (2 in 1) Adult Continuous      And    fat emulsion soybean oil      TPN (2 in 1) Adult Continuous 75 mL/hr at 04/10/17 1726    HYDROmorphone       PRN Meds:  phenol, LORazepam, ondansetron, naloxone

## 2017-04-11 NOTE — Progress Notes (Addendum)
Surgical Oncology/Hepatobiliary Surgery Progress Note     LOS: 13 days     Subjective:    No acute events overnight.  Feels well.  Is passing minimal amount of flatus.  NGT remains high output (1,200cc over past 24 hours).    Objective:    Vitals Sign Ranges for Past 24 Hours:  BP: (96-120)/(58-70)   Temp:  [35.8 C (96.4 F)-36.3 C (97.3 F)]   Temp src: Temporal (02/07 0824)  Heart Rate:  [61-70]   Resp:  [16]   SpO2:  [91 %-97 %]       Physical Exam:    General Appearance: comfortable, in NAD, alert and oriented  HEENT: NGT with brown gastric fluid output  Cardiac: RRR  Respiratory: non-labored  Abdomen: soft, minimally distended, minimal tenderness to palpation, midline incision c/d/i, pre-existing abdominal wall ecchyosis  Extremities: warm, well-perfused, 1+ edema bilaterally    Labs:    CBC:    Recent Labs  Lab 04/11/17  0053 04/10/17  0015 04/09/17  0514 04/08/17  0125 04/07/17  0155 04/06/17  0036   WBC 9.4 10.6* 10.2* 10.5* 10.0 8.8   Hemoglobin 7.9* 8.2* 8.6* 8.7* 8.5* 8.1*   Hematocrit 25* 26* 27* 27* 27* 26*   Platelets 343 331 370 [phone]       Metabolic Panel:    Recent Labs  Lab 04/11/17  0053 04/10/17  0015 04/09/17  1711 04/09/17  0514 04/08/17  0125 04/07/17  0155 04/06/17  0036   Sodium 133 132* 133 138 138 131* 138   Potassium 4.1 3.9 4.2 4.3 4.2 3.3 4.0   Chloride 99 98 101 103 101 95* 99   CO2 22 21 22 23 27 24 25    UN 14 7 5* 5* 10 12 20    Creatinine 0.76 0.85 0.84 0.95 0.83 0.81 0.97*   Glucose 123* 124* 119* 110* 109* 471* 69   Calcium 8.1* 7.8* 7.9* 8.1* 8.3* 7.6* 8.0*   Magnesium 2.1 1.9  --  1.8 2.1 1.6 2.0   Phosphorus 3.5 2.8  --  3.0 3.1 3.1 3.2          Assessment:    72 y.o. female with h/o recent PE and pancreatic cancer POD # 13  status post whipple procedure complicated by delayed gastric emptying.  NGT replaced on 04/04/17.    Plan:    - NPO, TPN  - NGT to LIWS  - UGI tomorrow if NGT output remains high today  - Continue Reglan 10mg  TID and erythromycin  - OOB, ambulation,  PT  - SCDs, therapeutic Lovenox for recent h/o PE  - Appreciate APS recs      [name], MD  04/11/2017     8:38 AM  General Surgery Resident    HPB and GI Surgery Attending:    I saw and evaluated the patient. I have reviewed and edited the resident's/fellow's note and confirm the findings and plan of care as documented above. Feels well, flatus but high NG out put. Will check UGI.    Charolotte Eke, MD

## 2017-04-11 NOTE — Progress Notes (Signed)
SPIRITUAL ASSESSMENT NOTE    Identified Religion: Christian      Pt and daughter in room. Daughter is a Marine scientist, she worked here for 15 years. Pt is optimistic and hopeful. She is a little feisty, in a playful way. She does not like the tube in her nose. She is thankful that she is here. She likes that care she has received. She thanks God and thinks God has more for her to do.          Patient Coping: Hopeful/ optimistic  Family Coping: Hopeful/ optimistic  Chaplain Interventions: Explored the meaning of a situation, Normalized feelings, Reflective listening    If pt or family needs chaplain service they will let nurse know.    Ladonna Snide      4:49 PM on 04/11/2017

## 2017-04-11 NOTE — Progress Notes (Signed)
SPIRITUAL ASSESSMENT NOTE    Identified Religion: Christian      Pt in bathroom and asked for me to visit later.        Patient Coping: Open discussion     Chaplain Interventions: Other    Will try to visit pt later today.    Ladonna Snide      3:34 PM on 04/11/2017

## 2017-04-12 ENCOUNTER — Inpatient Hospital Stay: Payer: Medicare (Managed Care)

## 2017-04-12 DIAGNOSIS — K449 Diaphragmatic hernia without obstruction or gangrene: Secondary | ICD-10-CM

## 2017-04-12 DIAGNOSIS — K219 Gastro-esophageal reflux disease without esophagitis: Secondary | ICD-10-CM

## 2017-04-12 LAB — COMPREHENSIVE METABOLIC PANEL
ALT: 59 U/L — ABNORMAL HIGH (ref 0–35)
AST: 34 U/L (ref 0–35)
Albumin: 3.2 g/dL — ABNORMAL LOW (ref 3.5–5.2)
Alk Phos: 211 U/L — ABNORMAL HIGH (ref 35–105)
Anion Gap: 13 (ref 7–16)
Bilirubin,Total: <0.2 mg/dL (ref 0.0–1.2)
CO2: 24 mmol/L (ref 20–28)
Calcium: 8.3 mg/dL — ABNORMAL LOW (ref 8.6–10.2)
Chloride: 99 mmol/L (ref 96–108)
Creatinine: 0.73 mg/dL (ref 0.51–0.95)
GFR,Black: 96 *
GFR,Caucasian: 83 *
Glucose: 124 mg/dL — ABNORMAL HIGH (ref 60–99)
Lab: 20 mg/dL (ref 6–20)
Potassium: 4.1 mmol/L (ref 3.3–5.1)
Sodium: 136 mmol/L (ref 133–145)
Total Protein: 5.7 g/dL — ABNORMAL LOW (ref 6.3–7.7)

## 2017-04-12 LAB — POCT GLUCOSE
Glucose POCT: 135 mg/dL — ABNORMAL HIGH (ref 60–99)
Glucose POCT: 121 mg/dL — ABNORMAL HIGH (ref 60–99)
Glucose POCT: 140 mg/dL — ABNORMAL HIGH (ref 60–99)

## 2017-04-12 LAB — CBC
Hematocrit: 24 % — ABNORMAL LOW (ref 34–45)
Hemoglobin: 7.6 g/dL — ABNORMAL LOW (ref 11.2–15.7)
MCH: 30 pg/cell (ref 26–32)
MCHC: 31 g/dL — ABNORMAL LOW (ref 32–36)
MCV: 95 fL (ref 79–95)
Platelets: 355 10*3/uL (ref 160–370)
RBC: 2.6 MIL/uL — ABNORMAL LOW (ref 3.9–5.2)
RDW: 18 % — ABNORMAL HIGH (ref 11.7–14.4)
WBC: 9.7 10*3/uL (ref 4.0–10.0)

## 2017-04-12 LAB — PHOSPHORUS: Phosphorus: 3.4 mg/dL (ref 2.7–4.5)

## 2017-04-12 LAB — MAGNESIUM: Magnesium: 1.8 mg/dL (ref 1.6–2.5)

## 2017-04-12 MED ORDER — THIAMINE HCL 100 MG/ML IJ SOLN *I*
INTRAVENOUS | Status: AC
Start: 2017-04-12 — End: 2017-04-13
  Filled 2017-04-12: qty 636

## 2017-04-12 MED ORDER — FAT EMULSION SOYBEAN OIL 20 % IV EMUL *WRAPPED*
60.0000 g | Freq: Every day | INTRAVENOUS | Status: AC
Start: 2017-04-12 — End: 2017-04-13
  Administered 2017-04-12: 60 g via INTRAVENOUS
  Filled 2017-04-12: qty 300

## 2017-04-12 MED ORDER — MAGNESIUM SULFATE 2 GM IN 50 ML *WRAPPED*
2000.0000 mg | Freq: Once | INTRAVENOUS | Status: AC
Start: 2017-04-12 — End: 2017-04-12
  Administered 2017-04-12: 2000 mg via INTRAVENOUS
  Filled 2017-04-12: qty 50

## 2017-04-12 NOTE — Plan of Care (Signed)
Problem: Safety  Goal: Patient will remain free of falls  Outcome: Maintaining      Problem: Pain/Comfort  Goal: Patient's pain or discomfort is manageable  Outcome: Maintaining      Problem: Mobility  Goal: Functional status is maintained or improved - Geriatric  Outcome: Maintaining      Problem: Nutrition  Goal: Patient's nutritional status is maintained or improved  Outcome: Maintaining      Problem: Cognitive function  Goal: Cognitive function will be maintained  Outcome: Maintaining      Problem: Bowel Elimination  Goal: Elimination patterns are normal or improving  Outcome: Maintaining      Problem: Post-Operative Hemodynamic Stability  Goal: Maintain Hemodynamic Stability  Outcome: Maintaining      Problem: Post-Operative Complications  Goal: Patient will remain free from symptoms of infection-post op  Outcome: Maintaining      Problem: Post-Operative Bowel Elimination  Goal: Elimination pattern is normal or improving  Outcome: Maintaining

## 2017-04-12 NOTE — Progress Notes (Signed)
Surgical Oncology/Hepatobiliary Surgery Progress Note     LOS: 14 days     Subjective:    No acute events overnight.  Is passing some flatus.  Denies nausea.  NGT output recorded as 650cc over past 24 hours.    Objective:    Vitals Sign Ranges for Past 24 Hours:  BP: (104-130)/(60-80)   Temp:  [35.8 C (96.4 F)-36.5 C (97.7 F)]   Temp src: Temporal (02/08 0759)  Heart Rate:  [65-72]   Resp:  [16]   SpO2:  [94 %-100 %]       Physical Exam:    General Appearance: comfortable, in NAD, alert and oriented  HEENT: NGT with brown gastric fluid output  Cardiac: RRR  Respiratory: non-labored  Abdomen: soft, minimally distended, minimal tenderness to palpation, midline incision c/d/i, pre-existing abdominal wall ecchyosis  Extremities: warm, well-perfused    Labs:    CBC:    Recent Labs  Lab 04/12/17  0021 04/11/17  0053 04/10/17  0015 04/09/17  0514 04/08/17  0125 04/07/17  0155   WBC 9.7 9.4 10.6* 10.2* 10.5* 10.0   Hemoglobin 7.6* 7.9* 8.2* 8.6* 8.7* 8.5*   Hematocrit 24* 25* 26* 27* 27* 27*   Platelets 355 343 331 370 360 532       Metabolic Panel:    Recent Labs  Lab 04/12/17  0021 04/11/17  0053 04/10/17  0015 04/09/17  1711 04/09/17  0514 04/08/17  0125 04/07/17  0155   Sodium 136 133 132* 133 138 138 131*   Potassium 4.1 4.1 3.9 4.2 4.3 4.2 3.3   Chloride 99 99 98 101 103 101 95*   CO2 24 22 21 22 23 27 24    UN 20 14 7  5* 5* 10 12   Creatinine 0.73 0.76 0.85 0.84 0.95 0.83 0.81   Glucose 124* 123* 124* 119* 110* 109* 471*   Calcium 8.3* 8.1* 7.8* 7.9* 8.1* 8.3* 7.6*   Magnesium 1.8 2.1 1.9  --  1.8 2.1 1.6   Phosphorus 3.4 3.5 2.8  --  3.0 3.1 3.1          Assessment:    72 y.o. female with h/o recent PE and pancreatic cancer POD # 14  status post whipple procedure complicated by delayed gastric emptying.  NGT replaced on 04/04/17.    Plan:    - UGI today to evaluate delayed gastric emptying and r/o proximal bowel obstruction  - NPO, TPN  - NGT to LIWS  - Continue Reglan 10mg  TID and erythromycin  - OOB, ambulation,  PT  - SCDs, therapeutic Lovenox for recent h/o PE  - Appreciate APS recs      [name], MD  04/12/2017     9:34 AM  General Surgery Resident

## 2017-04-12 NOTE — Consults (Signed)
Medical Nutrition Therapy- Weekly TPN Review    Patient reviewed with interdisciplinary team at [facility].     See most recent RD Student note 2/8 for full recommendations    TPN Approval  Approval for parenteral nutrition use to continue for 7 days.  Original Start Date: 2/6 Approved by RD/specialist  D/C Plan: TPN will be discontinued once pt tolerating PO diet     Laurence Aly, RD  Pager [phone]

## 2017-04-12 NOTE — Progress Notes (Signed)
Report Given   Diagnostic Imaging Nurse Handoff:    Study: gigu   A&O: yes  Telemetry: no  Precautions: no  Transport: stretcher  NPO: yes  Tube Feeds:no  Report Received From: Ivar Drape, RN          Procedure Summary

## 2017-04-12 NOTE — Student Note (Signed)
Medical Nutrition Therapy Brief Note: TPN check    Patient remains on TPN for nutrition support. Chart, labs, medications / TPN order, I/Os reviewed. Na 136 (WNL), K+ 4.1 (WNL), Cl 99 (WNL), CO2 24 (WNL), Ca 8.3 (L), PO4 3.4 (WNL), Mg 1.8 (WNL), UN 20 (WNL)- trending upward will continue to monitor. Recent t bili <0.2 (WNL) from 2/8. Recent TGs 153 (H) from 2/5.  BGs elevated 106 - 141 over the past 24 hours 1 unit insulin received 2/8.  MAR reviewed with repletions noted for Mg sulfate. I/Os significant for 1300 mL + 1x unmeasured UOP yesterday- 1350 mL NG output yesterday. Current TPN order reviewed - no changes to TPN order needed at this time.    Recommendations:  1.   Adult Continuous plus Fat (central infusion site):   Rate:63mL/hr   Volume (Amino Acid/Dextrose solution): 1858mL/day  Amino acids: 53g/L   Dextrose: 167g/L   Additives as follows:  Na: 8mEq/L  K: 48mEq/L  Ca: 4.56mEq/L  Mg: 63mEq/L  PO4: 105mmol/L  100 mg thiamine   1 mg folic acid   Balance Chloride:Acetate  Insulin: 0units/LITER  Fat Emulsion 20% infusion: 60gm/day (to be infused over 12 hours)  2. Continue to monitor K+/Phos/Mg++ daily as TPN is advanced to goal. Replete outside of TPN, as needed.  3. Continue to adjust or D/C IVFonce TPN is initiated (TPN will provide for baseline fluid needs). Continue to monitor fluid balance and provide non-dextrose containing IVF to replace any extraordinary losses.    Will continue to monitor.     Currie Paris   Dietetic Intern   Pager [phone]

## 2017-04-12 NOTE — Progress Notes (Signed)
Referral for home care service has been received from [facility] and We will resume care when the patient is discharged.  [facility] is aware the patient has a mediport and might be going home on TPN.  [facility] will continue to follow patient throughout hospital stay and plan for home care needs.  Comments:  Please notify me when the patient is closer to d/c so we can plan the 2 days of TPN teaching prior to discharge.Kathrynn Speed RN, [place]

## 2017-04-13 ENCOUNTER — Inpatient Hospital Stay: Payer: Medicare (Managed Care)

## 2017-04-13 DIAGNOSIS — K311 Adult hypertrophic pyloric stenosis: Secondary | ICD-10-CM

## 2017-04-13 DIAGNOSIS — Z4682 Encounter for fitting and adjustment of non-vascular catheter: Secondary | ICD-10-CM

## 2017-04-13 LAB — CBC
Hematocrit: 22 % — ABNORMAL LOW (ref 34–45)
Hemoglobin: 7.2 g/dL — ABNORMAL LOW (ref 11.2–15.7)
MCH: 30 pg/cell (ref 26–32)
MCHC: 32 g/dL (ref 32–36)
MCV: 94 fL (ref 79–95)
Platelets: 353 10*3/uL (ref 160–370)
RBC: 2.4 MIL/uL — ABNORMAL LOW (ref 3.9–5.2)
RDW: 18 % — ABNORMAL HIGH (ref 11.7–14.4)
WBC: 9.4 10*3/uL (ref 4.0–10.0)

## 2017-04-13 LAB — COMPREHENSIVE METABOLIC PANEL
ALT: 64 U/L — ABNORMAL HIGH (ref 0–35)
AST: 48 U/L — ABNORMAL HIGH (ref 0–35)
Albumin: 3.1 g/dL — ABNORMAL LOW (ref 3.5–5.2)
Alk Phos: 418 U/L — ABNORMAL HIGH (ref 35–105)
Anion Gap: 12 (ref 7–16)
Bilirubin,Total: <0.2 mg/dL (ref 0.0–1.2)
CO2: 25 mmol/L (ref 20–28)
Calcium: 8.2 mg/dL — ABNORMAL LOW (ref 8.6–10.2)
Chloride: 98 mmol/L (ref 96–108)
Creatinine: 0.71 mg/dL (ref 0.51–0.95)
GFR,Black: 99 *
GFR,Caucasian: 86 *
Glucose: 133 mg/dL — ABNORMAL HIGH (ref 60–99)
Lab: 24 mg/dL — ABNORMAL HIGH (ref 6–20)
Potassium: 4.3 mmol/L (ref 3.3–5.1)
Sodium: 135 mmol/L (ref 133–145)
Total Protein: 5.7 g/dL — ABNORMAL LOW (ref 6.3–7.7)

## 2017-04-13 LAB — MAGNESIUM: Magnesium: 2 mg/dL (ref 1.6–2.5)

## 2017-04-13 LAB — POCT GLUCOSE
Glucose POCT: 127 mg/dL — ABNORMAL HIGH (ref 60–99)
Glucose POCT: 127 mg/dL — ABNORMAL HIGH (ref 60–99)
Glucose POCT: 127 mg/dL — ABNORMAL HIGH (ref 60–99)
Glucose POCT: 132 mg/dL — ABNORMAL HIGH (ref 60–99)

## 2017-04-13 LAB — PHOSPHORUS: Phosphorus: 3.5 mg/dL (ref 2.7–4.5)

## 2017-04-13 MED ORDER — FAT EMULSION SOYBEAN OIL 20 % IV EMUL *WRAPPED*
60.0000 g | Freq: Every day | INTRAVENOUS | Status: AC
Start: 2017-04-13 — End: 2017-04-14
  Administered 2017-04-13: 60 g via INTRAVENOUS
  Filled 2017-04-13: qty 300

## 2017-04-13 MED ORDER — THIAMINE HCL 100 MG/ML IJ SOLN *I*
INTRAVENOUS | Status: AC
Start: 2017-04-13 — End: 2017-04-14
  Filled 2017-04-13: qty 636

## 2017-04-13 NOTE — Progress Notes (Signed)
Surgical Oncology/Hepatobiliary Surgery Progress Note     LOS: 15 days     Subjective:    No acute events overnight.  Is passing some flatus.  Denies nausea/emesis.  NGT output recorded as 1850cc over past 24 hours. Having some mild abdominal discomfort. Ambulating.   Denies f/c/cp/sob    Objective:    Vitals Sign Ranges for Past 24 Hours:  BP: (100-108)/(50-62)   Temp:  [35.9 C (96.6 F)-36.2 C (97.2 F)]   Temp src: Temporal (02/09 0355)  Heart Rate:  [65-77]   Resp:  [16]   SpO2:  [93 %-97 %]       Physical Exam:    General Appearance: comfortable, in NAD, alert and oriented  HEENT: NGT with brown gastric fluid output  Cardiac: RRR  Respiratory: non-labored  Abdomen: soft, minimally distended, minimal tenderness to palpation, midline incision c/d/i, pre-existing abdominal wall ecchyosis  Extremities: warm, well-perfused    Labs:    CBC:    Recent Labs  Lab 04/13/17  0000 04/12/17  0021 04/11/17  0053 04/10/17  0015 04/09/17  0514 04/08/17  0125   WBC 9.4 9.7 9.4 10.6* 10.2* 10.5*   Hemoglobin 7.2* 7.6* 7.9* 8.2* 8.6* 8.7*   Hematocrit 22* 24* 25* 26* 27* 27*   Platelets 353 355 343 331 370 093       Metabolic Panel:    Recent Labs  Lab 04/13/17  0000 04/12/17  0021 04/11/17  0053 04/10/17  0015 04/09/17  1711 04/09/17  0514 04/08/17  0125   Sodium 135 136 133 132* 133 138 138   Potassium 4.3 4.1 4.1 3.9 4.2 4.3 4.2   Chloride 98 99 99 98 101 103 101   CO2 25 24 22 21 22 23 27    UN 24* 20 14 7  5* 5* 10   Creatinine 0.71 0.73 0.76 0.85 0.84 0.95 0.83   Glucose 133* 124* 123* 124* 119* 110* 109*   Calcium 8.2* 8.3* 8.1* 7.8* 7.9* 8.1* 8.3*   Magnesium 2.0 1.8 2.1 1.9  --  1.8 2.1   Phosphorus 3.5 3.4 3.5 2.8  --  3.0 3.1          Assessment:    72 y.o. female with h/o recent PE and pancreatic cancer POD # 15  status post whipple procedure complicated by delayed gastric emptying.  NGT replaced on 04/04/17.    Plan:    - KUB this AM to evaluate for retained contrast in the gastric remnant  - NPO, TPN  - NGT to LIWS  after KUB  - Continue Reglan 10mg  TID and erythromycin  - OOB to chair, ambulation, PT  - SCDs, therapeutic Lovenox for recent h/o PE  - Appreciate APS recs      Gabriel Cirri, MD  04/13/2017     6:31 AM  General Surgery Resident

## 2017-04-13 NOTE — Consults (Addendum)
Division of Gastroenterology and Hepatology Initial Consult    Admit Date:  03/29/2017  Attending Provider:  Delano Metz, MD                                  Primary Care Physician:  Provider, None   Admitting Diagnosis: pancreatic adenocarcinoma s/p Whipple    Consult reason: narrowing in the proximal aspect of the gastrojejunal limb, GOO    Subjective:  Chart reviewed. History obtained from patient and chart review    Amanda Galloway is a 72 y.o. female with a past medical history notable for h/o recent PE and pancreatic cancer POD # 15  status post whipple procedure complicated by delayed gastric emptying. NGT replaced on 04/04/17. GI consulted for evaluation of high grade obstruction of the gastrojejunal limb, GOO.    Most recent XR shows: Severe focal luminal narrowing in the proximal aspect of the gastrojejunal limb with oral contrast remaining within the stomach and no oral contrast distally consistent with complete/high grade obstruction of the gastrojejunal limb. NG tube tip in the proximal stomach. Clips in the right hemiabdomen. Lumbar spondylosis.    Currently patient is NPO. Denies nausea and vomiting. Has abdominal pain 2/2 surgery. Patient has constipation.    The remainder of the GI review of systems is negative for jaundice, pruritus, scleral icterus, dysphagia (solids and liquids), odynophagia, heartburn, dyspepsia, hematemesis, diarrhea, hematochezia, and melena.    Review of Systems  Constitutional: No unexplained weight loss, fevers, or loss of appetite  Eyes: No vision changes, eye pain, or conjunctival injection  Ears, nose, and throat: No epistaxis, gingival bleeding, or sore throat   Cardiovascular: No chest pain, palpitations, dyspnea on exertion, lower extremity edema, or syncope  Pulmonary: No sputum production, shortness of breath at rest, wheezing, or hemoptysis   Gastrointestinal: See HPI  Genitourinary: No dysuria or hematuria  Endocrine: No heat or cold intolerance or  diaphoresis  Hematologic: No easy bruising or bleeding, anemia, thrombocytopenia, or lymphadenopathy  Musculoskeletal: No joint pain, stiffness, erythema, warmth, or swelling or myalgias  Integumentary: No rashes, lesions, or jaundice  Neurologic: No headaches, loss of extremity strength or sensation, or paresthesias  Psychiatric: No insomnia     Medications:  Home Medications:  Prior to Admission medications    Medication Sig Start Date End Date Taking? Authorizing Provider   methadone (DOLOPHINE) 5 MG tablet Take 15 mg by mouth nightly   Yes [provider]   LORazepam (ATIVAN) 0.5 MG tablet Take 1 tablet (0.5 mg total) by mouth 2 times daily as needed for Anxiety   Max daily dose: 1 mg  Patient taking differently: Take 0.5 mg by mouth 2 times daily as needed for Anxiety   0.21m nightly and prn 02/27/17  Yes DArdith Dark MD   methadone (DOLOPHINE) 5 MG tablet Take 275min the morning, 2049mn afternoon and 90m26m night.  Patient taking differently: 10 mg 2 times daily   Take 20mg8mthe morning, 20mg 82mfternoon and 90mg a40mght. 02/27/17  Yes Dennis,Ardith Darkenoxaparin (LOVENOX) 100 mg/mL injection Inject 1 Syringe (100 mg total) into the skin daily  Patient taking differently: Inject 100 mg into the skin 2 times daily  02/27/17  Yes Hellems, Guinevere A, PA   potassium chloride SA (KLOR-CON M20) 20 mEq  tablet Take 20 mEq by mouth 2 times daily   Yes [provider]  famotidine (PEPCID) 20 MG tablet Take 1 tablet (20 mg total) by mouth 2 times daily 12/28/16  Yes Hellems, Guinevere A, PA   escitalopram (LEXAPRO) 20 MG tablet Take 1 tablet (20 mg total) by mouth daily  Patient taking differently: Take 20 mg by mouth nightly    12/28/16  Yes Hellems, Guinevere A, PA   amitriptyline (ELAVIL) 25 MG tablet Take 1 tablet (25 mg total) by mouth nightly 11/02/16  Yes Hellems, Guinevere A, PA   dexamethasone (DECADRON) 4 MG tablet Take 1 tablet (4 mg total) by mouth 2 times daily  Patient  taking differently: Take 4 mg by mouth daily (with breakfast)    11/02/16  Yes Hellems, Guinevere A, PA   docusate sodium (COLACE) 100 MG capsule Take 2 capsules (200 mg total) by mouth 2 times daily 11/02/16  Yes Hellems, Guinevere A, PA   metoclopramide (REGLAN) 5 MG tablet Take 1 tablet (5 mg total) by mouth 3 times daily (before meals) 11/02/16  Yes Hellems, Guinevere A, PA   pantoprazole (PROTONIX) 40 MG EC tablet Take 1 tablet (40 mg total) by mouth 2 times daily (before meals)   Swallow whole. Do not crush, break, or chew. 11/02/16  Yes Hellems, Guinevere A, PA   senna (SENOKOT) 8.6 MG tablet Take 2 tablets by mouth 2 times daily 11/02/16  Yes Hellems, Guinevere A, PA   PREMARIN 0.625 MG tablet Take 0.625 mg by mouth every morning    07/10/16  Yes [provider]   ondansetron (ZOFRAN-ODT) 4 MG disintegrating tablet Take 1 tablet (4 mg total) by mouth 3 times daily as needed for Nausea   Place on top of tongue. 10/16/16  Yes Kathlen Brunswick, MD   levothyroxine (SYNTHROID, LEVOTHROID) 137 MCG tablet Take 1 tablet (137 mcg total) by mouth daily (before breakfast) 10/13/16 03/27/17 Yes Timmothy Sours, NP   Vitamins - senior (CENTRUM SILVER) TABS Take 1 tablet by mouth daily  Patient taking differently: Take 1 tablet by mouth every morning    10/14/16  Yes Timmothy Sours, NP   triamterene-hydrochlorothiazide (MAXZIDE-25) 37.5-25 MG per tablet Take 1 tablet by mouth daily  Patient taking differently: Take 1 tablet by mouth every morning    10/14/16 03/27/17 Yes Timmothy Sours, NP   fluorouracil (ADRUCIL) 50 MG/ML injection Administer 3,477 mg into the vein every 14 days   Start in infusion center.  Infuse over 46 hours at home. 12/31/16   Hellems, Guinevere A, PA   pegfilgrastim (NEULASTA) 6 MG/0.6ML injection syringe Inject 0.6 mLs (6 mg total) into the skin once 12/28/16 01/11/17  Hellems, Guinevere A, PA   Meds Exist in Therapy Plan Peg-Filgrastim 10/24/16   Kathlen Brunswick, MD    heparin 100 UNIT/ML injection 5 mLs (500 Units total) by Intracatheter route as needed (for line care) 10/17/16   Kathlen Brunswick, MD   sodium chloride 0.9 % flush 10 mLs by Intracatheter route as needed (for line care) 10/17/16   Kathlen Brunswick, MD     Scheduled Meds   erythromycin ethylsuccinate  200 mg Oral Q6H    methadone IV  5 mg Intravenous BID    And    methadone IV  7.5 mg Intravenous Daily    metoclopramide IV  10 mg Intravenous Q8H    enoxaparin  1 mg/kg Subcutaneous Q12H    dexamethasone  4 mg Oral Daily    pantoprazole  40 mg Intravenous Q24H    insulin regular  0-20 Units Subcutaneous Q6H  levothyroxine  137 mcg Oral Daily    amitriptyline  25 mg Oral Nightly    escitalopram  20 mg Oral Nightly     Continuous Infusions   TPN (2 in 1) Adult Continuous      And    fat emulsion soybean oil      TPN (2 in 1) Adult Continuous 75 mL/hr at 04/12/17 1745    HYDROmorphone       PRN Meds  phenol, LORazepam, ondansetron, naloxone    Allergies/Sensitivities:   Allergies as of 02/20/2017    (No Known Allergies (drug, envir, food or latex))       Past Medical Hx:   Past Medical History:   Diagnosis Date    Acute kidney failure 03/31/2017    Cancer     Depression     Fibromyalgia     Hypothyroidism        Past Surgical Hx:   Past Surgical History:   Procedure Laterality Date    CHOLECYSTECTOMY      CHOLECYSTECTOMY, LAPAROSCOPIC  09/06/2016    HYSTERECTOMY  08/1986    Fibroids    KNEE SURGERY Right     PR INSERT TUNNELED CV CATH WITH PORT Right 10/04/2016    Procedure: Right IJ MEDIPORT Insertion;  Surgeon: Delano Metz, MD;  Location: [facility] MAIN OR;  Service: Oncology General    PR LAP,DIAGNOSTIC ABDOMEN N/A 10/04/2016    Procedure: LAPAROSCOPY DIAGNOSTIC;  Surgeon: Delano Metz, MD;  Location: [facility] MAIN OR;  Service: Oncology General    PR PART [place] PANC,PROX+REMV DUOD+ANAST N/A 03/29/2017    Procedure: WHIPPLE PROCEDURE;  Surgeon: Delano Metz, MD;  Location: [facility] MAIN OR;  Service: Oncology General     ROTATOR CUFF REPAIR Right     TONSILLECTOMY AND ADENOIDECTOMY         Social Hx:   reports that she has never smoked. She has never used smokeless tobacco.   reports that she does not drink alcohol.   reports that she does not use drugs.    Family Hx: family history includes Breast cancer in her mother; Cancer in her father; Diabetes in her sister; Heart Disease in her father; Obesity in her sister.    PHYSICAL EXAM:  Patient Vitals for the past 72 hrs:   BP Temp Temp src Pulse Resp SpO2   04/13/17 1156 102/64 36.5 C (97.7 F) TEMPORAL 74 16 95 %   04/13/17 0803 106/62 36 C (96.8 F) TEMPORAL 64 14 96 %   04/13/17 0615 - - - - 16 -   04/13/17 0355 100/60 36 C (96.8 F) TEMPORAL 65 16 93 %   04/12/17 2356 106/60 36.1 C (97 F) TEMPORAL 66 16 93 %   04/12/17 2003 - - - - 16 -   04/12/17 1927 108/58 35.9 C (96.6 F) TEMPORAL 73 16 96 %   04/12/17 1508 104/50 36.1 C (97 F) TEMPORAL 75 16 95 %   04/12/17 1236 106/62 36.2 C (97.2 F) TEMPORAL 77 16 94 %   04/12/17 0759 104/60 36.1 C (97 F) TEMPORAL 71 16 97 %   04/12/17 0341 130/80 36.2 C (97.2 F) TEMPORAL 72 16 97 %   04/11/17 2350 120/80 35.9 C (96.6 F) TEMPORAL 65 16 100 %   04/11/17 1943 110/60 35.8 C (96.4 F) TEMPORAL 68 16 95 %   04/11/17 1632 120/60 36.2 C (97.2 F) TEMPORAL 68 16 95 %   04/11/17 1208 108/62 36.5 C (97.7 F) TEMPORAL 72 16  94 %   04/11/17 0824 118/60 36.1 C (97 F) TEMPORAL 70 16 97 %   04/11/17 0617 - - - - 16 -   04/11/17 0359 - - - - 16 -   04/11/17 0345 112/70 35.8 C (96.4 F) TEMPORAL 69 16 96 %   04/11/17 0107 - - - - 16 -   04/11/17 0002 120/70 35.8 C (96.4 F) TEMPORAL 68 16 92 %   04/10/17 2025 96/60 36 C (96.8 F) TEMPORAL 64 16 94 %   04/10/17 1612 106/58 35.8 C (96.4 F) TEMPORAL 70 16 91 %     Wt Readings from Last 3 Encounters:   04/09/17 73.5 kg (162 lb)   03/22/17 71.2 kg (157 lb)   03/11/17 73 kg (160 lb 15 oz)     I/O last 3 completed shifts:  02/08 0700 - 02/09 0659  In: 2388.2 (32.5 mL/kg) [P.O.:60;  I.V.:14 (0 mL/kg/hr); NG/GT:215]  Out: 2600 (35.4 mL/kg) [Urine:750 (0.4 mL/kg/hr); Emesis/NG output:1850]  Net: -211.8  Weight: 73.5 kg       General appearance: alert, NAD, depressed  Eye: EOM, anicteric   Ears, Nose, Mouth and throat: MMM, NGT in place  Neck: supple, symmetrical  Cardiovascular: regular rate and rhythm, S1, S2 normal  Respiratory: clear to auscultation bilaterally.    Gastrointestinal: abdomen soft, diffusely tender, surgical findings  Skin: no jaundice  Neurological: grossly normal, no asterixis  Psychiatric: depressed  Extremities: extremities warm and dry.      LAB DATA:      Lab results: 04/13/17  0000 04/12/17  0021 04/11/17  0053 04/10/17  0015 04/09/17  0514   WBC 9.4 9.7 9.4 10.6* 10.2*   Hemoglobin 7.2* 7.6* 7.9* 8.2* 8.6*   Hematocrit 22* 24* 25* 26* 27*   RBC 2.4* 2.6* 2.6* 2.7* 2.8*   Platelets 353 355 343 331 370           Lab results: 04/13/17  0000 04/12/17  0021 04/11/17  0053 04/10/17  0015 04/09/17  1711   Sodium 135 136 133 132* 133   Potassium 4.3 4.1 4.1 3.9 4.2   Chloride 98 99 99 98 101   CO2 _0 UN 24* _1 5*   Creatinine 0.71 0.73 0.76 0.85 0.84   GFR,Caucasian 86 83 79 69 70   GFR,Black 99 96 91 80 81   Glucose 133* 124* 123* 124* 119*   Calcium 8.2* 8.3* 8.1* 7.8* 7.9*   Total Protein 5.7* 5.7* 5.5* 5.1* 5.2*   Albumin 3.1* 3.2* 2.9* 2.9* 3.0*   ALT 64* 59* 89* 70* 54*   AST 48* 34 CANCELED 103* 53*   Alk Phos 418* 211* 236* 198* 198*   Bilirubin,Total <0.2 <0.2 <0.2 0.3 0.2           Lab results: 03/29/17  1758   INR 1.0       IMAGING:  Abd XR / KUB 04/13/2017:  FINDINGS/IMPRESSION:   Severe focal luminal narrowing in the proximal aspect of the gastrojejunal limb with oral contrast remaining within the stomach and no oral contrast distally consistent with complete/high grade obstruction of the gastrojejunal limb. NG tube tip in the proximal stomach. Clips in the right hemiabdomen. Lumbar spondylosis.    Abd XR / KUB 04/12/2017:  FINDINGS/IMPRESSION:    There is persistent barium in the gastric remnant and proximal efferent limb of the small bowel, approximately 9 hours after initial contrast administration. Overall findings are  concerning for gastric outlet obstruction. Some gas and stool noted in the normal caliber colon.    Upper GI without air with KUB 04/12/2017:  1. At 5 hours 15 minutes post administration of barium no barium is seen distal to the gastric remnant/efferent limb of small bowel. There is some contrast in the blind ending segment of small bowel near the gastric outlet. This is a pattern is likely due to a gastric outlet obstruction at the efferent limb. An additional delayed radiograph was scheduled to be taken at 8:00 PM this evening  2. A sliding hiatal hernia was also noted. Gastroesophageal reflux to the level of the UES. Please note the patient does have a nasogastric tube.     CT abdomen and pelvis with IV contrast 04/08/2017:  1. Status post Whipple procedure with postsurgical changes with atrophy and the body and the tail. There are several fluid collections in the surgical bed. These may represent post-surgical changes. The possibility of an anastomotic leak is difficult to assess at this point. Superimposed infection cannot be excluded but is less likely. Options for further evaluation include a delayed scan today (perhaps around 4 PM on 04/08/2017) or a short term follow-up scan in a few days to ensure complete resolution of these collections.  2. Nodular contour of the liver raising the concern of cirrhosis.       GI PROCEDURES:  EUS-FNA fiducial placement 02/19/2018:  The echoendoscope was advanced to the esophagus and no abnormal lymph nodes noted. The scope was easily advanced to the stomach. The body/tail of the pancreas revealed normal parenchyma. The pancreatic duct at the body/tail measured approximately 1-2 mm. The spleen was noted and revealed normal findings. The left lobe of the liver was examined which revealed normal  findings. The scope was advanced to the bulb of the duodenum which revealed head of the pancreas. The head of the pancreas imaged normal. The pancreatic duct was noted and measured 2 mm. The ampulla was noted which revealed normal appearance. The CBD was examined. No evidence of stone or sludge was noted. The CBD measured approximately 5 mm. No evidence of abnormal appearing lymph nodes were noted. The scope was advanced to the second portion and uncinate process along with ventral pancreas were visualized. Hazy hypoechoic area noted at the uncinate process adjacent to the SMA. Under fluorsoscopic vision and endoscopic vision, we placed 4 fiducials at the uncinate process mass with 22G FNA needle. The reason for placing 4 fiducials was 2 fiducials were deployed together and appeared as single fiducial.   Impression(s):  Uncinate process mass - ill defined - Fiducials placed.  Recommendation(s):   Return to Radiation oncology.      Assessment/Recommendations  72 y.o. female with a past medical history notable for h/o recent PE and pancreatic cancer POD # 15  status post whipple procedure complicated by delayed gastric emptying. NGT replaced on 04/04/17. GI consulted for evaluation of high grade obstruction of the gastrojejunal limb, GOO.    Upper GI without air with KUB 04/12/2017: suggest gastric outlet obstruction at the efferent limb.   Subsequent XR 04/14/2017 shows pooling of contrast: severe focal luminal narrowing in the proximal aspect of the gastrojejunal limb with oral contrast remaining within the stomach and no oral contrast distally consistent with complete/high grade obstruction of the gastrojejunal limb.    Ideally, stomach should have no remnant contrast so we are able to better visualize and evaluate. Ddx for etiology of obstruction includes post-surgical edema/inflammation vs stricture. Will  recommend pantoprazole bolus and gtt to help with inflammation. A possible intervention to help relieve  obstruction is dilation if indicated, however, dilation is risky in this patient on lovenox 59m q12h for treatment of PE. At this moment treatment for PE is more important so will not hold lovenox yet. Will discuss timing of procedure and plan in terms of lovenox with Dr. GRon Agee     - Will discuss timing of procedure (EGD +/- dilation) and plan in terms of lovenox with Dr. GRon Ageeor covering surgeon.   - continue lovenox for PE for now    - pantoprazole 871mIV once followed by pantoprazole gtt   - NPO   - keep NGT      Case discussed with consult attending.    AnKristen CardinalMD  PGY-4, Gastroenterology & Hepatology Fellow      ATTENDING ATTESTATION:      I saw, examined and evaluated the patient. I agree with the resident's/fellow's findings and plan of care as documented above.     Patient with GOO approx 2 weeks post Whipple. GI consulted for EGD and possible dilation. Will initiate aggressive I/v acid suppression which may help with local inflammation/edema at [place] limb location; will need EGD later this coming week with possible dilation; would like to evacuate as much Barium as possible prior to EGD and also need to address anticoagulation hold since she may need dilation. Plans d/w pt and family and they are agreeable    The above edited note reflects my detailed assessment and recommendations. The patient is agreeable to this management plan.       ViChelsea AusMD, FANolene BernheimAGAF  Professor of Medicine  Division of Gastroenterology & Hepatology  [facility]

## 2017-04-13 NOTE — Progress Notes (Signed)
During initial assessment patient tearful over current condition and expresses concern about prognosis. NGT to LIWS, flushed q4 w/ 30cc sterile water, and continues to have milky contrast like output.  KUB completed at bedside. Remained OOB to chair majority of the day and ambulating around unit multiple times with assistance.  Throughout shift, patient seemed to be in better spirits.   Ermelinda Das, RN

## 2017-04-14 LAB — CBC
Hematocrit: 23 % — ABNORMAL LOW (ref 34–45)
Hemoglobin: 7.2 g/dL — ABNORMAL LOW (ref 11.2–15.7)
MCH: 30 pg/cell (ref 26–32)
MCHC: 31 g/dL — ABNORMAL LOW (ref 32–36)
MCV: 95 fL (ref 79–95)
Platelets: 377 10*3/uL — ABNORMAL HIGH (ref 160–370)
RBC: 2.4 MIL/uL — ABNORMAL LOW (ref 3.9–5.2)
RDW: 17.8 % — ABNORMAL HIGH (ref 11.7–14.4)
WBC: 11.7 10*3/uL — ABNORMAL HIGH (ref 4.0–10.0)

## 2017-04-14 LAB — COMPREHENSIVE METABOLIC PANEL
ALT: 52 U/L — ABNORMAL HIGH (ref 0–35)
AST: 34 U/L (ref 0–35)
Albumin: 3 g/dL — ABNORMAL LOW (ref 3.5–5.2)
Alk Phos: 356 U/L — ABNORMAL HIGH (ref 35–105)
Anion Gap: 15 (ref 7–16)
Bilirubin,Total: <0.2 mg/dL (ref 0.0–1.2)
CO2: 24 mmol/L (ref 20–28)
Calcium: 8.4 mg/dL — ABNORMAL LOW (ref 8.6–10.2)
Chloride: 96 mmol/L (ref 96–108)
Creatinine: 0.72 mg/dL (ref 0.51–0.95)
GFR,Black: 98 *
GFR,Caucasian: 85 *
Glucose: 118 mg/dL — ABNORMAL HIGH (ref 60–99)
Lab: 22 mg/dL — ABNORMAL HIGH (ref 6–20)
Potassium: 4.2 mmol/L (ref 3.3–5.1)
Sodium: 135 mmol/L (ref 133–145)
Total Protein: 5.9 g/dL — ABNORMAL LOW (ref 6.3–7.7)

## 2017-04-14 LAB — POCT GLUCOSE
Glucose POCT: 106 mg/dL — ABNORMAL HIGH (ref 60–99)
Glucose POCT: 121 mg/dL — ABNORMAL HIGH (ref 60–99)
Glucose POCT: 127 mg/dL — ABNORMAL HIGH (ref 60–99)
Glucose POCT: 144 mg/dL — ABNORMAL HIGH (ref 60–99)

## 2017-04-14 LAB — PHOSPHORUS: Phosphorus: 3.2 mg/dL (ref 2.7–4.5)

## 2017-04-14 LAB — MAGNESIUM: Magnesium: 1.9 mg/dL (ref 1.6–2.5)

## 2017-04-14 MED ORDER — FAT EMULSION SOYBEAN OIL 20 % IV EMUL *WRAPPED*
60.0000 g | Freq: Every day | INTRAVENOUS | Status: AC
Start: 2017-04-14 — End: 2017-04-15
  Administered 2017-04-14: 60 g via INTRAVENOUS
  Filled 2017-04-14: qty 300

## 2017-04-14 MED ORDER — THIAMINE HCL 100 MG/ML IJ SOLN *I*
INTRAVENOUS | Status: AC
Start: 2017-04-14 — End: 2017-04-15
  Filled 2017-04-14: qty 636

## 2017-04-14 MED ORDER — SODIUM CHLORIDE 0.9 % IV SOLN WRAPPED *I*
8.0000 mg/h | INTRAVENOUS | Status: DC
Start: 2017-04-14 — End: 2017-04-16
  Administered 2017-04-14 – 2017-04-15 (×2): 8 mg/h via INTRAVENOUS
  Filled 2017-04-14 (×2): qty 50

## 2017-04-14 MED ORDER — HYDROMORPHONE HCL 2 MG/ML IJ SOLN *WRAPPED*
0.5000 mg | Freq: Once | INTRAMUSCULAR | Status: AC
Start: 2017-04-14 — End: 2017-04-14
  Administered 2017-04-14: 0.5 mg via INTRAVENOUS

## 2017-04-14 MED ORDER — SODIUM CHLORIDE 0.9 % IV BOLUS *I*
500.0000 mL | Freq: Once | Status: AC
Start: 2017-04-14 — End: 2017-04-14
  Administered 2017-04-14: 500 mL via INTRAVENOUS

## 2017-04-14 MED ORDER — PANTOPRAZOLE SODIUM 40 MG IV SOLR *I*
80.0000 mg | Freq: Once | INTRAVENOUS | Status: AC
Start: 2017-04-14 — End: 2017-04-14
  Administered 2017-04-14: 80 mg via INTRAVENOUS
  Filled 2017-04-14: qty 20

## 2017-04-14 NOTE — Progress Notes (Addendum)
Surgical Oncology/Hepatobiliary Surgery Progress Note     LOS: 16 days     Subjective:    No acute events overnight.  Is passing some flatus, no BMs.  Denies nausea/emesis.  NGT output recorded as 900cc over past 24 hours.   Having some mild abdominal discomfort. C/o cough. Ambulating.   Denies f/c/cp/sob    Objective:    Vitals Sign Ranges for Past 24 Hours:  BP: (102-124)/(60-70)   Temp:  [35.9 C (96.6 F)-37 C (98.6 F)]   Temp src: Temporal (02/10 0451)  Heart Rate:  [64-79]   Resp:  [14-16]   SpO2:  [93 %-99 %]       Physical Exam:    General Appearance: comfortable, in NAD, alert and oriented  HEENT: NGT light gastric fluid output  Cardiac: RRR  Respiratory: non-labored  Abdomen: soft, minimally distended, minimal tenderness to palpation, midline incision c/d/i, pre-existing abdominal wall ecchyosis  Extremities: warm, well-perfused    Labs:    CBC:    Recent Labs  Lab 04/14/17  0005 04/13/17  0000 04/12/17  0021 04/11/17  0053 04/10/17  0015 04/09/17  0514   WBC 11.7* 9.4 9.7 9.4 10.6* 10.2*   Hemoglobin 7.2* 7.2* 7.6* 7.9* 8.2* 8.6*   Hematocrit 23* 22* 24* 25* 26* 27*   Platelets 377* 353 355 343 331 481       Metabolic Panel:    Recent Labs  Lab 04/14/17  0005 04/13/17  0000 04/12/17  0021 04/11/17  0053 04/10/17  0015 04/09/17  1711 04/09/17  0514   Sodium 135 135 136 133 132* 133 138   Potassium 4.2 4.3 4.1 4.1 3.9 4.2 4.3   Chloride 96 98 99 99 98 101 103   CO2 24 25 24 22 21 22 23    UN 22* 24* 20 14 7  5* 5*   Creatinine 0.72 0.71 0.73 0.76 0.85 0.84 0.95   Glucose 118* 133* 124* 123* 124* 119* 110*   Calcium 8.4* 8.2* 8.3* 8.1* 7.8* 7.9* 8.1*   Magnesium 1.9 2.0 1.8 2.1 1.9  --  1.8   Phosphorus 3.2 3.5 3.4 3.5 2.8  --  3.0          Assessment:    72 y.o. female with h/o recent PE and pancreatic cancer POD # 16  status post whipple procedure complicated by delayed gastric emptying.  NGT replaced on 04/04/17.    Plan:    - Appreciate GI consult - likely EGD tomorrow (will double check timing to hold tx  lovenox)  - Keep HOB elevated as much as possible  - NPO, TPN  - NGT to LIWS  - Continue Reglan 10mg  TID and erythromycin  - OOB to chair, ambulation, PT  - SCDs, therapeutic Lovenox for recent h/o PE  - Appreciate APS recs      Gabriel Cirri, MD  04/14/2017     6:56 AM  General Surgery Resident

## 2017-04-15 ENCOUNTER — Inpatient Hospital Stay: Payer: Medicare (Managed Care)

## 2017-04-15 LAB — CBC
Hematocrit: 21 % — ABNORMAL LOW (ref 34–45)
Hemoglobin: 6.6 g/dL — ABNORMAL LOW (ref 11.2–15.7)
MCH: 30 pg/cell (ref 26–32)
MCHC: 31 g/dL — ABNORMAL LOW (ref 32–36)
MCV: 96 fL — ABNORMAL HIGH (ref 79–95)
Platelets: 379 10*3/uL — ABNORMAL HIGH (ref 160–370)
RBC: 2.2 MIL/uL — ABNORMAL LOW (ref 3.9–5.2)
RDW: 17.8 % — ABNORMAL HIGH (ref 11.7–14.4)
WBC: 7.1 10*3/uL (ref 4.0–10.0)

## 2017-04-15 LAB — TYPE AND SCREEN
ABO RH Blood Type: A NEG
Antibody Screen: NEGATIVE

## 2017-04-15 LAB — COMPREHENSIVE METABOLIC PANEL
ALT: 214 U/L — ABNORMAL HIGH (ref 0–35)
AST: 425 U/L — ABNORMAL HIGH (ref 0–35)
Albumin: 3 g/dL — ABNORMAL LOW (ref 3.5–5.2)
Alk Phos: 707 U/L — ABNORMAL HIGH (ref 35–105)
Anion Gap: 10 (ref 7–16)
Bilirubin,Total: 0.5 mg/dL (ref 0.0–1.2)
CO2: 25 mmol/L (ref 20–28)
Calcium: 8.1 mg/dL — ABNORMAL LOW (ref 8.6–10.2)
Chloride: 97 mmol/L (ref 96–108)
Creatinine: 0.7 mg/dL (ref 0.51–0.95)
GFR,Black: 101 *
GFR,Caucasian: 88 *
Glucose: 132 mg/dL — ABNORMAL HIGH (ref 60–99)
Lab: 23 mg/dL — ABNORMAL HIGH (ref 6–20)
Potassium: 3.7 mmol/L (ref 3.3–5.1)
Sodium: 132 mmol/L — ABNORMAL LOW (ref 133–145)
Total Protein: 5.8 g/dL — ABNORMAL LOW (ref 6.3–7.7)

## 2017-04-15 LAB — POCT GLUCOSE
Glucose POCT: 128 mg/dL — ABNORMAL HIGH (ref 60–99)
Glucose POCT: 131 mg/dL — ABNORMAL HIGH (ref 60–99)
Glucose POCT: 143 mg/dL — ABNORMAL HIGH (ref 60–99)
Glucose POCT: 157 mg/dL — ABNORMAL HIGH (ref 60–99)

## 2017-04-15 LAB — PLATELET COUNT: Platelets: 374 10*3/uL — ABNORMAL HIGH (ref 160–370)

## 2017-04-15 LAB — PHOSPHORUS: Phosphorus: 3.7 mg/dL (ref 2.7–4.5)

## 2017-04-15 LAB — MCHC: MCHC: 30 g/dL — ABNORMAL LOW (ref 32–36)

## 2017-04-15 LAB — MAGNESIUM: Magnesium: 1.9 mg/dL (ref 1.6–2.5)

## 2017-04-15 LAB — HCT AND HGB
Hematocrit: 22 % — ABNORMAL LOW (ref 34–45)
Hemoglobin: 6.8 g/dL — ABNORMAL LOW (ref 11.2–15.7)

## 2017-04-15 MED ORDER — HEPARIN INFUSION 100 UNITS/ML (BOLUS FROM BAG) *I*
80.0000 [IU]/kg | Freq: Once | INTRAVENOUS | Status: DC
Start: 2017-04-15 — End: 2017-04-15

## 2017-04-15 MED ORDER — MAGNESIUM SULFATE 2 GM IN 50 ML *WRAPPED*
2000.0000 mg | Freq: Once | INTRAVENOUS | Status: AC
Start: 2017-04-15 — End: 2017-04-15
  Administered 2017-04-15: 2000 mg via INTRAVENOUS
  Filled 2017-04-15: qty 50

## 2017-04-15 MED ORDER — THIAMINE HCL 100 MG/ML IJ SOLN *I*
INTRAVENOUS | Status: AC
Start: 2017-04-15 — End: 2017-04-16
  Filled 2017-04-15: qty 636

## 2017-04-15 MED ORDER — FAT EMULSION SOYBEAN OIL 20 % IV EMUL *WRAPPED*
60.0000 g | Freq: Every day | INTRAVENOUS | Status: DC
Start: 2017-04-15 — End: 2017-04-15

## 2017-04-15 MED ORDER — HEPARIN INFUSION 100 UNITS/ML (BOLUS FROM BAG) *I*
0.0000 [IU] | INTRAVENOUS | Status: DC | PRN
Start: 2017-04-15 — End: 2017-04-16
  Administered 2017-04-16: 5600 [IU] via INTRAVENOUS

## 2017-04-15 MED ORDER — FAT EMULSION SOYBEAN OIL 20 % IV EMUL *WRAPPED*
60.0000 g | Freq: Every day | INTRAVENOUS | Status: AC
Start: 2017-04-15 — End: 2017-04-16
  Administered 2017-04-15: 60 g via INTRAVENOUS
  Filled 2017-04-15: qty 300

## 2017-04-15 MED ORDER — HEPARIN INFUSION 100 UNITS/ML (DVT-PE PROTOCOL) *I*
0.0000 [IU]/h | INTRAVENOUS | Status: DC
Start: 2017-04-15 — End: 2017-04-16
  Administered 2017-04-15 (×2): 1300 [IU]/h via INTRAVENOUS
  Administered 2017-04-16: 1750 [IU]/h via INTRAVENOUS
  Administered 2017-04-16: 1450 [IU]/h via INTRAVENOUS
  Filled 2017-04-15 (×3): qty 250

## 2017-04-15 MED ORDER — POTASSIUM CHLORIDE 10 MEQ/50ML IV SOLN *I*
10.0000 meq | Freq: Once | INTRAVENOUS | Status: AC
Start: 2017-04-15 — End: 2017-04-15
  Administered 2017-04-15: 10 meq via INTRAVENOUS
  Filled 2017-04-15: qty 50

## 2017-04-15 MED ORDER — THIAMINE HCL 100 MG/ML IJ SOLN *I*
INTRAVENOUS | Status: DC
Start: 2017-04-15 — End: 2017-04-15

## 2017-04-15 NOTE — Plan of Care (Signed)
Brief GI Note    Patient seen at bedside with attending, Dr Earnstine Regal. Explained to her and daughter that if primary team is agreeable, we will transition AC from enoxaparin to heparin gtt today, and plan for EGD with dilation on Wednesday. Demonstrated understanding.    Lockie Mola, MD  PGY-4, Gastroenterology & Hepatology Fellow

## 2017-04-15 NOTE — Progress Notes (Addendum)
Surgical Oncology/Hepatobiliary Surgery Progress Note     LOS: 17 days     Subjective:    No acute events overnight. Sitting up in chair most of the day and ambulating.  Denies passing flatus. No BMs. Some nausea, no emesis.    LFTs elevated today: ALT 214 (52), AST 425 (34), ALP 707 (356)  Started on protonix gtt yesterday  NGT output recorded as 850cc over past 24 hours.   Denies f/c/cp/sob    Objective:    Vitals Sign Ranges for Past 24 Hours:  BP: (100-120)/(58-70)   Temp:  [35.9 C (96.6 F)-36.6 C (97.9 F)]   Temp src: Temporal (02/11 0400)  Heart Rate:  [69-87]   Resp:  [16-20]   SpO2:  [93 %-97 %]       Physical Exam:    General Appearance: comfortable, in NAD, alert and oriented  HEENT: NGT light gastric fluid output  Cardiac: RRR  Respiratory: non-labored  Abdomen: soft, minimally distended, minimal tenderness to palpation, midline incision c/d/i, pre-existing abdominal wall ecchyosis  Extremities: warm, well-perfused    Labs:    CBC:    Recent Labs  Lab 04/15/17  0412 04/15/17  0025 04/14/17  0005 04/13/17  0000 04/12/17  0021 04/11/17  0053 04/10/17  0015   WBC  --  7.1 11.7* 9.4 9.7 9.4 10.6*   Hemoglobin 6.8* 6.6* 7.2* 7.2* 7.6* 7.9* 8.2*   Hematocrit 22* 21* 23* 22* 24* 25* 26*   Platelets  --  379* 377* 353 355 343 967       Metabolic Panel:    Recent Labs  Lab 04/15/17  0025 04/14/17  0005 04/13/17  0000 04/12/17  0021 04/11/17  0053 04/10/17  0015   Sodium 132* 135 135 136 133 132*   Potassium 3.7 4.2 4.3 4.1 4.1 3.9   Chloride 97 96 98 99 99 98   CO2 25 24 25 24 22 21    UN 23* 22* 24* 20 14 7    Creatinine 0.70 0.72 0.71 0.73 0.76 0.85   Glucose 132* 118* 133* 124* 123* 124*   Calcium 8.1* 8.4* 8.2* 8.3* 8.1* 7.8*   Magnesium 1.9 1.9 2.0 1.8 2.1 1.9   Phosphorus 3.7 3.2 3.5 3.4 3.5 2.8          Assessment:    72 y.o. female with h/o recent PE and pancreatic cancer POD # 17  status post whipple procedure complicated by delayed gastric emptying.  NGT replaced on 04/04/17.    Plan:    - Appreciate GI  consult and discussion of EGD  - Protonix bolus and gtt started  - Keep HOB elevated as much as possible  - NPO, TPN  - NGT to LIWS  - Discontinued Reglan 10mg  TID and erythromycin  - OOB to chair, ambulation, PT  - SCDs, therapeutic Lovenox for recent h/o PE    Gabriel Cirri, MD  04/15/2017     6:59 AM  General Surgery Resident

## 2017-04-15 NOTE — Progress Notes (Signed)
[facility] Referral Note:   [facility] will follow this patient for home infusion needs.  The plan of care will be reviewed and patient/caregiver will be evaluated for appropriateness for home services.  Prior to discharge to home the following will be needed:  1. Insurance verification and associated costs reviewed with responsible parties  2. Letter of medical necessity for TPN signed by provider  3. Venous access device  4. Patient/caregiver teach on infusion care and expectations. Mandatory two teaches with back up     person for TPN.  5. Prescriptions for infusion supplies are to be eScribed (include quantity and refills) to [facility] :  [place], phone [phone]   Medication dose, frequency, duration, TPN formula   NS 10 ml prefilled syringes (number)    Heparin 10 units/ml, 5 ml prefilled syringes (number)    Labs: as appropriate to include physician and lab    Empty 10 ml syringes(as appropriate for labs) (number)      6. [facility] will deliver supplies to patients home after discharge.     Thank you for the referral to [facility].  Julian Hy, RN  [facility] [employer] Number

## 2017-04-15 NOTE — Consults (Signed)
Medical Nutrition Therapy Brief Note: TPN check    Patient remains on TPN for nutrition support. Chart, labs, medications / TPN order, I/Os reviewed. Plan for EGD. Oral contrast remaining in stomach. Na 132 (L), K+ 3.7 (WNL), Cl 97 (WNL), CO2 25 (WNL), Ca 8.9 (H corrected for low albumin 3.0), PO4 3.7 (WNL), Mg 1.9 (WNL). UN trending up 23, will continue to monitor. Recent t bili 0.5 (WNL) from 2/11. Other LFTs trending up (ALT 214, AST 425). BGs ranging from 121 - 143 over the past 24 hours.  MAR reviewed with repletions noted for planned Mg sulfate, planned KCl. I/Os significant for 650 ml NGT output, -750 ml UOP. Current TPN order reviewed - no changes to TPN order needed at this time.    Recommendations:  1.  Adult Continuous plus Fat (central infusion site):   Rate:73mL/hr   Volume (Amino Acid/Dextrose solution): 186mL/day  Amino acids: 53g/L   Dextrose: 167g/L   Additives as follows:  Na: 75mEq/L  K: 47mEq/L  Ca: 4.64mEq/L  Mg: 66mEq/L  PO4: 32mmol/L  100 mg thiamine   1 mg folic acid   Balance Chloride:Acetate  Insulin: 0units/LITER  Fat Emulsion 20% infusion: 60gm/day (to be infused over 12 hours)  2. Continue to monitor K+/Phos/Mg++ daily as TPN is advanced to goal. Replete outside of TPN, as needed.  3. Continue to adjust or D/C IVFonce TPN is initiated (TPN will provide for baseline fluid needs). Continue to monitor fluid balance and provide non-dextrose containing IVF to replace any extraordinary losses.    Will continue to monitor.     Laurence Aly, [place]  Pager [phone]

## 2017-04-15 NOTE — Plan of Care (Signed)
Problem: Safety  Goal: Patient will remain free of falls  Outcome: Maintaining      Problem: Pain/Comfort  Goal: Patient's pain or discomfort is manageable  Outcome: Maintaining  Pt pushing PCA button appropriately     Problem: Mobility  Goal: Functional status is maintained or improved - Geriatric  Outcome: Maintaining      Problem: Nutrition  Goal: Patient's nutritional status is maintained or improved  Outcome: Maintaining  Pt NPO and has TPN running per orders     Problem: Bowel Elimination  Goal: Elimination patterns are normal or improving  Outcome: Maintaining      Problem: Post-Operative Hemodynamic Stability  Goal: Maintain Hemodynamic Stability  Outcome: Maintaining      Problem: Post-Operative Complications  Goal: Patient will remain free from symptoms of infection-post op  Outcome: Maintaining      Problem: Post-Operative Bowel Elimination  Goal: Elimination pattern is normal or improving  Outcome: Maintaining

## 2017-04-15 NOTE — Plan of Care (Signed)
Cognitive function     Cognitive function will be maintained Maintaining        Mobility     Functional status is maintained or improved - Geriatric Maintaining        Nutrition     Patient's nutritional status is maintained or improved Maintaining        Pain/Comfort     Patient's pain or discomfort is manageable Maintaining        Post-Operative Complications     Patient will remain free from symptoms of infection-post op Maintaining        Safety     Patient will remain free of falls Maintaining          Bowel Elimination     Elimination patterns are normal or improving Progressing towards goal        Post-Operative Bowel Elimination     Elimination pattern is normal or improving Progressing towards goal        Post-Operative Hemodynamic Stability     Maintain Hemodynamic Stability Progressing towards goal

## 2017-04-15 NOTE — Progress Notes (Signed)
SW met with pt and daughter. Daughter requesting assistance with parking. SW provided ten gratis parking tokens to daughter and educated her on where to obtain parking passes. Daughter and pt had no further concerns or questions. Will continue to assist as needed and plan for safe discharge.

## 2017-04-16 ENCOUNTER — Inpatient Hospital Stay: Payer: Medicare (Managed Care)

## 2017-04-16 ENCOUNTER — Inpatient Hospital Stay: Payer: Medicare (Managed Care) | Admitting: Anesthesiology

## 2017-04-16 DIAGNOSIS — K91870 Postprocedural hematoma of a digestive system organ or structure following a digestive system procedure: Secondary | ICD-10-CM

## 2017-04-16 DIAGNOSIS — R7989 Other specified abnormal findings of blood chemistry: Secondary | ICD-10-CM

## 2017-04-16 DIAGNOSIS — J9600 Acute respiratory failure, unspecified whether with hypoxia or hypercapnia: Secondary | ICD-10-CM

## 2017-04-16 DIAGNOSIS — K7200 Acute and subacute hepatic failure without coma: Secondary | ICD-10-CM

## 2017-04-16 DIAGNOSIS — Z8507 Personal history of malignant neoplasm of pancreas: Secondary | ICD-10-CM

## 2017-04-16 DIAGNOSIS — K838 Other specified diseases of biliary tract: Secondary | ICD-10-CM

## 2017-04-16 DIAGNOSIS — N179 Acute kidney failure, unspecified: Secondary | ICD-10-CM

## 2017-04-16 DIAGNOSIS — F32A Depression, unspecified: Secondary | ICD-10-CM | POA: Diagnosis present

## 2017-04-16 DIAGNOSIS — J984 Other disorders of lung: Secondary | ICD-10-CM | POA: Diagnosis not present

## 2017-04-16 LAB — DIFF MANUAL
Bands %: 28 % (ref 0–10)
Bands %: 54 % (ref 0–10)
Diff Based On: 115 CELLS
Diff Based On: 114 CELLS
Metamyelocyte %: 4 % — ABNORMAL HIGH (ref 0–1)
Metamyelocyte %: 15 % — ABNORMAL HIGH (ref 0–1)
React Lymph %: 1 % (ref 0–6)
React Lymph %: 1 % (ref 0–6)

## 2017-04-16 LAB — VENOUS GASES / WHOLE BLOOD PANEL
Base Excess,VENOUS: -5 mmol/L — ABNORMAL LOW (ref <=2)
Bicarbonate,VENOUS: 20 mmol/L — ABNORMAL LOW (ref 21–28)
CO2 (Calc),VENOUS: 21 mmol/L — ABNORMAL LOW (ref 22–31)
CO: 0.2 %
FO2 HB,VENOUS: 56 % — ABNORMAL LOW (ref 63–83)
Glucose,WB: 179 mg/dL — ABNORMAL HIGH (ref 60–99)
Hemoglobin: 7.7 g/dL — ABNORMAL LOW (ref 11.2–15.7)
ICA @7.4,WB: 4.2 mg/dL — ABNORMAL LOW (ref 4.8–5.2)
ICA Uncorr,WB: 4.2 mg/dL
Lactate VEN,WB: 4.6 mmol/L (ref 0.5–2.2)
Methemoglobin: 0.5 % (ref 0.0–1.0)
NA, WB: 128 mmol/L — ABNORMAL LOW (ref 135–145)
PCO2,VENOUS: 35 mm Hg — ABNORMAL LOW (ref 40–50)
PH,VENOUS: 7.38 (ref 7.32–7.42)
PO2,VENOUS: 29 mm Hg (ref 25–43)
Potassium,WB: 3.2 mmol/L — ABNORMAL LOW (ref 3.4–4.7)

## 2017-04-16 LAB — CBC AND DIFFERENTIAL
Baso # K/uL: 0 10*3/uL (ref 0.0–0.1)
Baso # K/uL: 0 10*3/uL (ref 0.0–0.1)
Basophil %: 0 %
Basophil %: 0 %
Eos # K/uL: 0 10*3/uL (ref 0.0–0.4)
Eos # K/uL: 0 10*3/uL (ref 0.0–0.4)
Eosinophil %: 0 %
Eosinophil %: 0 %
Hematocrit: 24 % — ABNORMAL LOW (ref 34–45)
Hematocrit: 23 % — ABNORMAL LOW (ref 34–45)
Hemoglobin: 7.5 g/dL — ABNORMAL LOW (ref 11.2–15.7)
Hemoglobin: 7.2 g/dL — ABNORMAL LOW (ref 11.2–15.7)
Lymph # K/uL: 0.6 10*3/uL — ABNORMAL LOW (ref 1.2–3.7)
Lymph # K/uL: 0.3 10*3/uL — ABNORMAL LOW (ref 1.2–3.7)
Lymphocyte %: 7 %
Lymphocyte %: 7.9 %
MCH: 29 pg/cell (ref 26–32)
MCH: 29 pg/cell (ref 26–32)
MCHC: 32 g/dL (ref 32–36)
MCHC: 32 g/dL (ref 32–36)
MCV: 91 fL (ref 79–95)
MCV: 91 fL (ref 79–95)
Mono # K/uL: 0.1 10*3/uL — ABNORMAL LOW (ref 0.2–0.9)
Mono # K/uL: 0 10*3/uL — ABNORMAL LOW (ref 0.2–0.9)
Monocyte %: 0.9 %
Monocyte %: 0 %
Neut # K/uL: 6.4 10*3/uL — ABNORMAL HIGH (ref 1.6–6.1)
Neut # K/uL: 2.2 10*3/uL (ref 1.6–6.1)
Nucl RBC # K/uL: 0 10*3/uL (ref 0.0–0.0)
Nucl RBC # K/uL: 0 10*3/uL (ref 0.0–0.0)
Nucl RBC %: 0 /100 WBC (ref 0.0–0.2)
Nucl RBC %: 1.1 /100 WBC — ABNORMAL HIGH (ref 0.0–0.2)
Platelets: 501 10*3/uL — ABNORMAL HIGH (ref 160–370)
Platelets: 413 10*3/uL — ABNORMAL HIGH (ref 160–370)
RBC: 2.6 MIL/uL — ABNORMAL LOW (ref 3.9–5.2)
RBC: 2.5 MIL/uL — ABNORMAL LOW (ref 3.9–5.2)
RDW: 18.2 % — ABNORMAL HIGH (ref 11.7–14.4)
RDW: 18.1 % — ABNORMAL HIGH (ref 11.7–14.4)
Seg Neut %: 59.1 %
Seg Neut %: 21.9 %
WBC: 7.4 10*3/uL (ref 4.0–10.0)
WBC: 2.9 10*3/uL — ABNORMAL LOW (ref 4.0–10.0)

## 2017-04-16 LAB — URINALYSIS REFLEX TO CULTURE
Blood,UA: NEGATIVE
Glucose,UA: 50 mg/dL — AB
Ketones, UA: NEGATIVE
Leuk Esterase,UA: NEGATIVE
Nitrite,UA: NEGATIVE
Protein,UA: NEGATIVE mg/dL
Specific Gravity,UA: 1.035 — ABNORMAL HIGH (ref 1.002–1.030)
pH,UA: 6 (ref 5.0–8.0)

## 2017-04-16 LAB — CBC
Hematocrit: 21 % — ABNORMAL LOW (ref 34–45)
Hemoglobin: 6.7 g/dL — ABNORMAL LOW (ref 11.2–15.7)
MCH: 29 pg/cell (ref 26–32)
MCHC: 32 g/dL (ref 32–36)
MCV: 93 fL (ref 79–95)
Platelets: 413 10*3/uL — ABNORMAL HIGH (ref 160–370)
RBC: 2.3 MIL/uL — ABNORMAL LOW (ref 3.9–5.2)
RDW: 18.1 % — ABNORMAL HIGH (ref 11.7–14.4)
WBC: 9.9 10*3/uL (ref 4.0–10.0)

## 2017-04-16 LAB — TRIGLYCERIDES: Triglycerides: 46 mg/dL

## 2017-04-16 LAB — COMPREHENSIVE METABOLIC PANEL
ALT: 431 U/L — ABNORMAL HIGH (ref 0–35)
ALT: 410 U/L — ABNORMAL HIGH (ref 0–35)
ALT: 271 U/L — ABNORMAL HIGH (ref 0–35)
AST: 566 U/L — ABNORMAL HIGH (ref 0–35)
AST: 441 U/L — ABNORMAL HIGH (ref 0–35)
AST: 221 U/L — ABNORMAL HIGH (ref 0–35)
Albumin: 2.9 g/dL — ABNORMAL LOW (ref 3.5–5.2)
Albumin: 3 g/dL — ABNORMAL LOW (ref 3.5–5.2)
Albumin: 2.3 g/dL — ABNORMAL LOW (ref 3.5–5.2)
Alk Phos: 987 U/L — ABNORMAL HIGH (ref 35–105)
Alk Phos: 990 U/L — ABNORMAL HIGH (ref 35–105)
Alk Phos: 669 U/L — ABNORMAL HIGH (ref 35–105)
Anion Gap: 12 (ref 7–16)
Anion Gap: 14 (ref 7–16)
Anion Gap: 16 (ref 7–16)
Bilirubin,Total: 1.6 mg/dL — ABNORMAL HIGH (ref 0.0–1.2)
Bilirubin,Total: 1.5 mg/dL — ABNORMAL HIGH (ref 0.0–1.2)
Bilirubin,Total: 1.4 mg/dL — ABNORMAL HIGH (ref 0.0–1.2)
CO2: 24 mmol/L (ref 20–28)
CO2: 22 mmol/L (ref 20–28)
CO2: 19 mmol/L — ABNORMAL LOW (ref 20–28)
Calcium: 8.1 mg/dL — ABNORMAL LOW (ref 8.6–10.2)
Calcium: 8.2 mg/dL — ABNORMAL LOW (ref 8.6–10.2)
Calcium: 7.5 mg/dL — ABNORMAL LOW (ref 8.6–10.2)
Chloride: 95 mmol/L — ABNORMAL LOW (ref 96–108)
Chloride: 95 mmol/L — ABNORMAL LOW (ref 96–108)
Chloride: 96 mmol/L (ref 96–108)
Creatinine: 0.74 mg/dL (ref 0.51–0.95)
Creatinine: 0.74 mg/dL (ref 0.51–0.95)
Creatinine: 0.73 mg/dL (ref 0.51–0.95)
GFR,Black: 94 *
GFR,Black: 94 *
GFR,Black: 96 *
GFR,Caucasian: 82 *
GFR,Caucasian: 82 *
GFR,Caucasian: 83 *
Glucose: 146 mg/dL — ABNORMAL HIGH (ref 60–99)
Glucose: 144 mg/dL — ABNORMAL HIGH (ref 60–99)
Glucose: 184 mg/dL — ABNORMAL HIGH (ref 60–99)
Lab: 21 mg/dL — ABNORMAL HIGH (ref 6–20)
Lab: 20 mg/dL (ref 6–20)
Lab: 20 mg/dL (ref 6–20)
Potassium: 4 mmol/L (ref 3.3–5.1)
Potassium: 3.9 mmol/L (ref 3.3–5.1)
Potassium: 3.6 mmol/L (ref 3.3–5.1)
Sodium: 131 mmol/L — ABNORMAL LOW (ref 133–145)
Sodium: 131 mmol/L — ABNORMAL LOW (ref 133–145)
Sodium: 131 mmol/L — ABNORMAL LOW (ref 133–145)
Total Protein: 5.8 g/dL — ABNORMAL LOW (ref 6.3–7.7)
Total Protein: 6 g/dL — ABNORMAL LOW (ref 6.3–7.7)
Total Protein: 5 g/dL — ABNORMAL LOW (ref 6.3–7.7)

## 2017-04-16 LAB — PHOSPHORUS: Phosphorus: 2.9 mg/dL (ref 2.7–4.5)

## 2017-04-16 LAB — PROTIME-INR
INR: 1.8 — ABNORMAL HIGH (ref 0.9–1.1)
Protime: 20.1 s — ABNORMAL HIGH (ref 10.0–12.9)

## 2017-04-16 LAB — ART GASES / WHOLE BLOOD PANEL
Base Excess, Arterial: -6 mmol/L — ABNORMAL LOW (ref <=2)
CO2,ART (Calc): 17 mmol/L — ABNORMAL LOW (ref 23–28)
CO: 0.3 %
FO2 Hb, Arterial: 89 % — ABNORMAL LOW (ref 90–100)
Glucose,WB: 180 mg/dL — ABNORMAL HIGH (ref 60–99)
HCO3, Arterial: 17 mmol/L — ABNORMAL LOW (ref 21–26)
Hemoglobin: 6.3 g/dL — ABNORMAL LOW (ref 11.2–15.7)
ICA @7.4,WB: 4.2 mg/dL — ABNORMAL LOW (ref 4.8–5.2)
ICA Uncorr,WB: 4 mg/dL
Lactate ART,WB: 6.2 mmol/L (ref 0.3–0.8)
Methemoglobin: 1 % (ref 0.0–1.0)
NA, WB: 125 mmol/L — ABNORMAL LOW (ref 135–145)
Potassium,WB: 2.6 mmol/L — CL (ref 3.4–4.7)
pCO2, Arterial: 22 mm Hg — ABNORMAL LOW (ref 35–46)
pH: 7.49 — ABNORMAL HIGH (ref 7.35–7.45)
pO2,Arterial: 56 mm Hg — ABNORMAL LOW (ref 80–100)

## 2017-04-16 LAB — MAGNESIUM
Magnesium: 1.9 mg/dL (ref 1.6–2.5)
Magnesium: 1.6 mg/dL (ref 1.6–2.5)

## 2017-04-16 LAB — APTT
aPTT: 53.6 s — ABNORMAL HIGH (ref 25.8–37.9)
aPTT: 31.3 s (ref 25.8–37.9)

## 2017-04-16 LAB — HCT AND HGB
Hematocrit: 18 % — CL (ref 34–45)
Hemoglobin: 5.8 g/dL — ABNORMAL LOW (ref 11.2–15.7)

## 2017-04-16 LAB — RBC MORPHOLOGY

## 2017-04-16 LAB — POCT GLUCOSE
Glucose POCT: 153 mg/dL — ABNORMAL HIGH (ref 60–99)
Glucose POCT: 163 mg/dL — ABNORMAL HIGH (ref 60–99)
Glucose POCT: 142 mg/dL — ABNORMAL HIGH (ref 60–99)

## 2017-04-16 LAB — LACTATE, VENOUS, WHOLE BLOOD: Lactate VEN,WB: 2.3 mmol/L — ABNORMAL HIGH (ref 0.5–2.2)

## 2017-04-16 LAB — MCHC: MCHC: 32 g/dL (ref 32–36)

## 2017-04-16 MED ORDER — PLASMA-LYTE IV BOLUS *WRAPPED*
1000.0000 mL | Freq: Once | INTRAVENOUS | Status: AC
Start: 2017-04-16 — End: 2017-04-17
  Administered 2017-04-16: 1000 mL via INTRAVENOUS

## 2017-04-16 MED ORDER — PANTOPRAZOLE SODIUM 40 MG IV SOLR *I*
40.0000 mg | INTRAVENOUS | Status: DC
Start: 2017-04-17 — End: 2017-04-26
  Administered 2017-04-17 – 2017-04-26 (×10): 40 mg via INTRAVENOUS
  Filled 2017-04-16 (×10): qty 10

## 2017-04-16 MED ORDER — PIPERACILLIN-TAZOBACTAM IN D5W 3.375 GM/50ML IV SOLN *I*
3.3750 g | Freq: Four times a day (QID) | INTRAVENOUS | Status: DC
Start: 2017-04-16 — End: 2017-05-04
  Administered 2017-04-16 – 2017-05-04 (×88): 3.375 g via INTRAVENOUS
  Filled 2017-04-16 (×85): qty 50

## 2017-04-16 MED ORDER — HYDROMORPHONE HCL 2 MG/ML IJ SOLN *WRAPPED*
0.5000 mg | INTRAMUSCULAR | Status: DC | PRN
Start: 2017-04-16 — End: 2017-04-18
  Administered 2017-04-16: 0.5 mg via INTRAVENOUS
  Filled 2017-04-16 (×2): qty 1

## 2017-04-16 MED ORDER — FENTANYL CITRATE 20 MCG/ML IN 100ML NACL IJ SOLN *I*
25.0000 ug/h | INTRAMUSCULAR | Status: DC
Start: 2017-04-16 — End: 2017-04-21
  Administered 2017-04-16: 75 ug/h via INTRAVENOUS
  Administered 2017-04-16: 50 ug/h via INTRAVENOUS
  Administered 2017-04-16 (×4): 75 ug/h via INTRAVENOUS
  Administered 2017-04-17: 125 ug/h via INTRAVENOUS
  Administered 2017-04-17: 100 ug/h via INTRAVENOUS
  Administered 2017-04-17: 125 ug/h via INTRAVENOUS
  Administered 2017-04-17 (×4): 150 ug/h via INTRAVENOUS
  Administered 2017-04-17: 125 ug/h via INTRAVENOUS
  Administered 2017-04-17: 100 ug/h via INTRAVENOUS
  Administered 2017-04-17: 150 ug/h via INTRAVENOUS
  Administered 2017-04-17: 100 ug/h via INTRAVENOUS
  Administered 2017-04-17: 150 ug/h via INTRAVENOUS
  Administered 2017-04-17 (×2): 100 ug/h via INTRAVENOUS
  Administered 2017-04-17: 125 ug/h via INTRAVENOUS
  Administered 2017-04-17: 150 ug/h via INTRAVENOUS
  Administered 2017-04-17 (×2): 125 ug/h via INTRAVENOUS
  Administered 2017-04-17: 100 ug/h via INTRAVENOUS
  Administered 2017-04-17: 150 ug/h via INTRAVENOUS
  Administered 2017-04-17: 125 ug/h via INTRAVENOUS
  Administered 2017-04-17: 150 ug/h via INTRAVENOUS
  Administered 2017-04-17: 125 ug/h via INTRAVENOUS
  Administered 2017-04-17 (×2): 150 ug/h via INTRAVENOUS
  Administered 2017-04-18: 125 ug/h via INTRAVENOUS
  Administered 2017-04-18: 75 ug/h via INTRAVENOUS
  Administered 2017-04-18: 100 ug/h via INTRAVENOUS
  Administered 2017-04-18 (×2): 125 ug/h via INTRAVENOUS
  Administered 2017-04-18: 75 ug/h via INTRAVENOUS
  Administered 2017-04-18 (×2): 125 ug/h via INTRAVENOUS
  Administered 2017-04-18: 50 ug/h via INTRAVENOUS
  Administered 2017-04-18 (×5): 125 ug/h via INTRAVENOUS
  Administered 2017-04-18: 50 ug/h via INTRAVENOUS
  Administered 2017-04-18: 125 ug/h via INTRAVENOUS
  Administered 2017-04-18: 100 ug/h via INTRAVENOUS
  Administered 2017-04-18 (×3): 125 ug/h via INTRAVENOUS
  Administered 2017-04-18: 100 ug/h via INTRAVENOUS
  Administered 2017-04-18 (×3): 125 ug/h via INTRAVENOUS
  Administered 2017-04-18: 75 ug/h via INTRAVENOUS
  Administered 2017-04-18 (×3): 125 ug/h via INTRAVENOUS
  Administered 2017-04-18: 100 ug/h via INTRAVENOUS
  Administered 2017-04-18 (×4): 125 ug/h via INTRAVENOUS
  Administered 2017-04-19: 100 ug/h via INTRAVENOUS
  Administered 2017-04-19: 125 ug/h via INTRAVENOUS
  Administered 2017-04-19: 100 ug/h via INTRAVENOUS
  Administered 2017-04-19 (×17): 125 ug/h via INTRAVENOUS
  Administered 2017-04-19: 100 ug/h via INTRAVENOUS
  Administered 2017-04-19 (×3): 125 ug/h via INTRAVENOUS
  Administered 2017-04-19: 100 ug/h via INTRAVENOUS
  Administered 2017-04-19: 125 ug/h via INTRAVENOUS
  Administered 2017-04-20: 50 ug/h via INTRAVENOUS
  Administered 2017-04-20 (×4): 75 ug/h via INTRAVENOUS
  Administered 2017-04-20: 100 ug/h via INTRAVENOUS
  Administered 2017-04-20 (×3): 50 ug/h via INTRAVENOUS
  Administered 2017-04-20: 75 ug/h via INTRAVENOUS
  Administered 2017-04-20: 50 ug/h via INTRAVENOUS
  Administered 2017-04-20: 100 ug/h via INTRAVENOUS
  Administered 2017-04-20 (×5): 75 ug/h via INTRAVENOUS
  Administered 2017-04-20 (×2): 50 ug/h via INTRAVENOUS
  Administered 2017-04-20: 75 ug/h via INTRAVENOUS
  Administered 2017-04-20 (×2): 100 ug/h via INTRAVENOUS
  Administered 2017-04-20: 75 ug/h via INTRAVENOUS
  Administered 2017-04-20: 100 ug/h via INTRAVENOUS
  Administered 2017-04-20: 50 ug/h via INTRAVENOUS
  Administered 2017-04-20: 100 ug/h via INTRAVENOUS
  Administered 2017-04-20: 50 ug/h via INTRAVENOUS
  Administered 2017-04-20: 100 ug/h via INTRAVENOUS
  Administered 2017-04-21 (×9): 50 ug/h via INTRAVENOUS
  Filled 2017-04-16 (×8): qty 100

## 2017-04-16 MED ORDER — STERILE WATER FOR IRRIGATION IR SOLN *I*
900.0000 mL | Freq: Once | Status: AC
Start: 2017-04-16 — End: 2017-04-16
  Administered 2017-04-16: 650 mL via NASOGASTRIC

## 2017-04-16 MED ORDER — MIDAZOLAM HCL 1 MG/ML IJ SOLN *I* WRAPPED
INTRAMUSCULAR | Status: AC
Start: 2017-04-16 — End: 2017-04-17
  Administered 2017-04-17: 0
  Filled 2017-04-16: qty 2

## 2017-04-16 MED ORDER — ETOMIDATE 2 MG/ML IV SOLN *I*
INTRAVENOUS | Status: DC | PRN
Start: 2017-04-16 — End: 2017-08-22
  Administered 2017-04-16: 10 mg via INTRAVENOUS

## 2017-04-16 MED ORDER — LIDOCAINE HCL 1 % IJ SOLN *I*
INTRAMUSCULAR | Status: AC
Start: 2017-04-16 — End: 2017-04-16
  Filled 2017-04-16: qty 20

## 2017-04-16 MED ORDER — FENTANYL CITRATE 50 MCG/ML IJ SOLN *WRAPPED*
100.0000 ug | Freq: Once | INTRAMUSCULAR | Status: AC
Start: 2017-04-16 — End: 2017-04-16
  Administered 2017-04-16: 100 ug via INTRAVENOUS

## 2017-04-16 MED ORDER — MIDAZOLAM HCL 1 MG/ML IJ SOLN *I* WRAPPED
0.5000 mg | INTRAMUSCULAR | Status: AC | PRN
Start: 2017-04-16 — End: 2017-04-17
  Administered 2017-04-16: 0.5 mg via INTRAVENOUS
  Filled 2017-04-16: qty 2

## 2017-04-16 MED ORDER — FENTANYL CITRATE 50 MCG/ML IJ SOLN *WRAPPED*
INTRAMUSCULAR | Status: AC
Start: 2017-04-16 — End: 2017-04-16
  Filled 2017-04-16: qty 2

## 2017-04-16 MED ORDER — SODIUM CHLORIDE 0.9 % IV SOLN WRAPPED *I*
3.0000 mL/h | Status: DC
Start: 2017-04-16 — End: 2017-04-16

## 2017-04-16 MED ORDER — SODIUM CHLORIDE 0.9 % IV BOLUS *I*
500.0000 mL | Freq: Once | Status: AC
Start: 2017-04-16 — End: 2017-04-16
  Administered 2017-04-16: 500 mL via INTRAVENOUS

## 2017-04-16 MED ORDER — SODIUM CHLORIDE 0.9 % IV SOLN WRAPPED *I*
3.0000 mL/h | Status: DC
Start: 2017-04-16 — End: 2017-04-18

## 2017-04-16 MED ORDER — MIDAZOLAM HCL 1 MG/ML IJ SOLN *I* WRAPPED
INTRAMUSCULAR | Status: AC
Start: 2017-04-16 — End: 2017-04-16
  Filled 2017-04-16: qty 2

## 2017-04-16 MED ORDER — ROCURONIUM BROMIDE 10 MG/ML IV SOLN *WRAPPED*
Status: DC | PRN
Start: 2017-04-16 — End: 2017-08-22
  Administered 2017-04-16: 100 mg via INTRAVENOUS

## 2017-04-16 MED ORDER — STERILE WATER FOR INJECTION IV SOLN *I*
125.0000 mL/h | INTRAVENOUS | Status: DC
Start: 2017-04-16 — End: 2017-04-17
  Administered 2017-04-16 – 2017-04-17 (×10): 125 mL/h via INTRAVENOUS
  Filled 2017-04-16 (×2): qty 150

## 2017-04-16 MED ORDER — FAT EMULSION SOYBEAN OIL 20 % IV EMUL *WRAPPED*
60.0000 g | Freq: Every day | INTRAVENOUS | Status: AC
Start: 2017-04-16 — End: 2017-04-17
  Administered 2017-04-16 – 2017-04-17 (×13): 60 g via INTRAVENOUS
  Filled 2017-04-16: qty 300

## 2017-04-16 MED ORDER — SODIUM CHLORIDE 0.45 % IV SOLN *I*
1075.0000 mL/h | Freq: Once | INTRAVENOUS | Status: AC
Start: 2017-04-16 — End: 2017-04-17
  Administered 2017-04-16: 1075 mL/h via INTRAVENOUS
  Filled 2017-04-16: qty 75

## 2017-04-16 MED ORDER — VANCOMYCIN HCL IN D5W 1500 MG/250 ML IV SOLN *I*
1500.0000 mg | Freq: Once | INTRAVENOUS | Status: AC
Start: 2017-04-16 — End: 2017-04-17
  Administered 2017-04-16 (×3): 1500 mg via INTRAVENOUS
  Filled 2017-04-16: qty 250

## 2017-04-16 MED ORDER — CALCIUM GLUCONATE 9.4 MEQ (2,000 MG) IN NS 110 ML *I*
INTRAVENOUS | Status: AC
Start: 2017-04-16 — End: 2017-04-16
  Administered 2017-04-16: 9.4 meq via INTRAVENOUS
  Filled 2017-04-16: qty 100

## 2017-04-16 MED ORDER — FENTANYL CITRATE 50 MCG/ML IJ SOLN *WRAPPED*
INTRAMUSCULAR | Status: AC | PRN
Start: 2017-04-16 — End: 2017-04-16
  Administered 2017-04-16: 100 ug via INTRAVENOUS

## 2017-04-16 MED ORDER — POTASSIUM CHLORIDE 20 MEQ/50ML IV SOLN *I*
20.0000 meq | INTRAVENOUS | Status: AC
Start: 2017-04-16 — End: 2017-04-17
  Administered 2017-04-16 – 2017-04-17 (×3): 20 meq via INTRAVENOUS
  Filled 2017-04-16 (×3): qty 50

## 2017-04-16 MED ORDER — PLASMA-LYTE IV BOLUS *WRAPPED*
1000.0000 mL | Freq: Once | INTRAVENOUS | Status: AC
Start: 2017-04-16 — End: 2017-04-16
  Administered 2017-04-16: 1000 mL via INTRAVENOUS

## 2017-04-16 MED ORDER — HYDROMORPHONE HCL 2 MG/ML IJ SOLN *WRAPPED*
0.5000 mg | Freq: Once | INTRAMUSCULAR | Status: AC
Start: 2017-04-16 — End: 2017-04-16
  Administered 2017-04-16: 0.5 mg via INTRAVENOUS

## 2017-04-16 MED ORDER — THIAMINE HCL 100 MG/ML IJ SOLN *I*
INTRAVENOUS | Status: AC
Start: 2017-04-16 — End: 2017-04-17
  Filled 2017-04-16: qty 636

## 2017-04-16 MED ORDER — FENTANYL CITRATE 20 MCG/ML BOLUS FROM BAG *I*
25.0000 ug | INTRAMUSCULAR | Status: DC | PRN
Start: 2017-04-16 — End: 2017-04-21
  Administered 2017-04-16 (×2): 50 ug via INTRAVENOUS
  Administered 2017-04-16: 100 ug via INTRAVENOUS
  Administered 2017-04-16 (×2): 50 ug via INTRAVENOUS
  Administered 2017-04-17: 100 ug via INTRAVENOUS
  Administered 2017-04-17: 25 ug via INTRAVENOUS
  Administered 2017-04-17: 125 ug via INTRAVENOUS
  Administered 2017-04-17: 50 ug via INTRAVENOUS
  Administered 2017-04-17 – 2017-04-18 (×2): 100 ug via INTRAVENOUS
  Administered 2017-04-18: 125 ug via INTRAVENOUS
  Administered 2017-04-18: 50 ug via INTRAVENOUS
  Administered 2017-04-18: 125 ug via INTRAVENOUS
  Administered 2017-04-18: 100 ug via INTRAVENOUS
  Administered 2017-04-18 – 2017-04-19 (×4): 125 ug via INTRAVENOUS
  Administered 2017-04-20 (×2): 50 ug via INTRAVENOUS
  Administered 2017-04-20: 75 ug via INTRAVENOUS
  Administered 2017-04-21: 150 ug via INTRAVENOUS

## 2017-04-16 MED ORDER — NOREPINEPHRINE 8 MG IN NS 250 ML INFUSION *WRAPPED*
1.0000 ug/min | INTRAVENOUS | Status: DC
Start: 2017-04-16 — End: 2017-04-16
  Filled 2017-04-16: qty 250

## 2017-04-16 MED ORDER — PROPOFOL INFUSION 10 MG/ML *I*
0.0000 ug/kg/min | INTRAVENOUS | Status: DC
Start: 2017-04-16 — End: 2017-04-16
  Administered 2017-04-16 (×2): 10 ug/kg/min via INTRAVENOUS

## 2017-04-16 MED ORDER — MIDAZOLAM HCL 1 MG/ML IJ SOLN *I* WRAPPED
INTRAMUSCULAR | Status: AC | PRN
Start: 2017-04-16 — End: 2017-04-16
  Administered 2017-04-16: 2 mg via INTRAVENOUS

## 2017-04-16 MED ORDER — CALCIUM GLUCONATE 9.4 MEQ (2,000 MG) IN NS 110 ML *I*
9.4000 meq | INTRAVENOUS | Status: AC
Start: 2017-04-16 — End: 2017-04-16
  Administered 2017-04-16 (×3): 9.4 meq via INTRAVENOUS
  Filled 2017-04-16: qty 100

## 2017-04-16 MED ORDER — HYDROMORPHONE HCL 2 MG/ML IJ SOLN *WRAPPED*
0.5000 mg | Freq: Once | INTRAMUSCULAR | Status: DC
Start: 2017-04-16 — End: 2017-04-16
  Filled 2017-04-16: qty 1

## 2017-04-16 MED ORDER — IOHEXOL 350 MG/ML (OMNIPAQUE) IV SOLN *I*
1.0000 mL | Freq: Once | INTRAVENOUS | Status: AC
Start: 2017-04-16 — End: 2017-04-16
  Administered 2017-04-16: 108 mL via INTRAVENOUS

## 2017-04-16 MED ORDER — PROPOFOL 10 MG/ML BOLUS FROM INFUSION (FOR PANEL)*I*
10.0000 mg | INTRAVENOUS | Status: DC | PRN
Start: 2017-04-16 — End: 2017-04-16

## 2017-04-16 NOTE — Progress Notes (Signed)
Report Given To  Baxter Flattery, RN      Descriptive Sentence / Reason for Admission   PMHx: pancreatic adenocarcinoma    1/25 s/p open Whipple. She is being admitted to SICU for post-operative monitoring, notably hemodynamic status and post-op pain control      Active Issues / Relevant Events   2/8 upper GI series done  2/11D: fatigue, refusing to get cleaned up, no GI today, KUB of abdomen, heparin gtt started for Hx of PE. Nights: A&O x3 in beginning of the shift, Pain issues overnight & decreased mentation, [place] PA notified.   2/12: Continued confusion/pain on CC5, blood/urine culture sent. CT abd done-showed hematoma near common bile duct. IR for biliary drain. Intubated for inc RR. Transferred to [address]. 2L IVF for HoTn. Propofol D/C'd      To Do List  Follow up VBG @2000   SBP >90  Flush NG q 3 60cc  Restart Heparin gtt 2/13?      Anticipatory Guidance / Discharge Planning  HAC: ETT, NGT, mediport, PIV x2, biliary drain, foley    D/C TBD  Daughter is RN

## 2017-04-16 NOTE — Progress Notes (Addendum)
1900:  CTSP for hypotension/tachycardia.  Lactate 4.1; 25% bands.  1L fluid given.  Vancomycin added (x1 and will redose by level).      2030:  Arterial line attempted in R radial artery, but unable to thread catheter.  Left brachial arterial line inserted by Kristie Cowman, SICU NP.  1L fluid given for hypotension. OABG showing worsening PaO2; PEEP increased to 10.  1L fluid of 1/2 & 1/2 given and MIVF of 3 amps bicarb in sterile water started.      Case discussed with Dr. Sarina Ill.  Family is present at bedside and was updated on plan of care and clinical course.     This patient is critically ill with at least 1 organ system failure associated with a high probability of imminent or life threatening deterioration. The care I have delivered involved high complexity decision making to assess, manipulate, and support vital system function(s), to treat the vital organ system failure and/or prevent further life threatening deterioration of the patient's condition.     Critical care time: I spent ~60 minutes personally attending to this patient's critical care needs. This critical care time is exclusive of any time for separately billable procedures          Collene Schlichter, NP

## 2017-04-16 NOTE — Progress Notes (Signed)
Transfuse one unit Plasma without sign or symptom of blood transfusion reaction. Please see doc flows for documentation.  Phoebe Perch, RN

## 2017-04-16 NOTE — Student Note (Signed)
Medical Nutrition Therapy Brief Note: TPN check    Plan for EGD on Wednesday. CT for abdomen and pelvis to evaluate for obstruction in setting of worsening abdominal pain and LFTs. Patient remains on TPN for nutrition support. Chart, labs, medications / TPN order, I/Os reviewed. Na 131 (L), K+ 4.0 (WNL), Cl 95 (L), CO2 24 (WNL), Ca Corrected 8.98 (WNL), PO4 2.9 (WNL), Mg 1.9 (WNL). Recent t bili 1.6 (H) trending up, ALT 431 (H), AST 566 (H), Alk Phos 987 (H) from 2/12. BGs WNL ranging from 128 - 163over the past 24 hours.  MAR reviewed with repletions noted for Mg sulfate and KCl given on 2/11 . I/Os significant for urine 1,500 2/11, NG tube output 1,050 2/11. Current TPN order reviewed -please see recommendations below for changes.    Recommendations:  1.  Adult Continuous plus Fat (central infusion site):   Rate:31m/hr   Volume (Amino Acid/Dextrose solution): 18053mday  Amino acids: 53g/L   Dextrose: 167g/L   Additives as follows:  Na: 9615mL  K: 68m56m  Ca: 4.5mEq20m Mg: 5mEq/34mPO4: 12mmol64m100 mg thiamine   1 mg folic acid    Maximize Chloride  Insulin: 0units/LITER  Fat Emulsion 20% infusion: 60gm/day (to be infused over 12 hours)  2. Continue to monitor BMP, Mg, and PO4. Replete as needed.  3.Continue to adjust or D/C IVFonce TPN is initiated (TPN will provide for baseline fluid needs). Continue to monitor fluid balance and provide non-dextrose containing IVF to replace any extraordinary losses.    Will continue to monitor.     Allie SFoster Simpson # [phone]

## 2017-04-16 NOTE — Progress Notes (Signed)
Assumed care of pt at 1900. Pt denied CP, SOB, N/V, fever, and chills. VS stable overnight, but pain issues. Pt A&O x3 at the time of initial assessment, daughter at bedside. Later pt c/o 10/10 abd pain. PCA dose unavailable at the time. Pt asked to use bathroom but was unable to amb, urinated in bed. [place], [place] notified of pain and AMS, at bedside to assess. Pt w/ delayed responses and difficult to arouse. One time iv dilaudid and prn ativan dose not given d/t concerns for sedation per provider. Pt called again for pain later and urinary incontinence. [place], [place] notified. PRN ativan administered & bladder scan as ordered. Post void residual 39cc. Linens and gown changed. NG output 500cc throughout the shift. APTT not therapeutic, heparin gtt increased and bolus given. Call bell and PCA button within reach. Will continue to monitor.     Jannet Mantis, [place]

## 2017-04-16 NOTE — Progress Notes (Signed)
Interventional Radiology Pre-Procedure Handoff/Checklist    NPO: [x] YES [] NO [] N/A over a week  Tube feed Stopped: [] YES [] NO [x] N/A     Anticoagulants:heparin drip Levada Dy RN will turm off now and will be need to be off for 1 1/2 hr per Dr Truman Hayward    Telemetry: [x] YES [] NO [] N/A     Transport mode: Bed  Number of Transporters needed: 2      IV access:   Peripheral IV 04/14/17 1855 Right Antecubital (Active)   Phlebitis Scale Grade 0 04/16/2017  1:00 AM   Infiltration Scale Grade 0 04/16/2017  1:00 AM   Line Status Infusing 04/16/2017  1:00 AM   Dressing Type Transparent 04/16/2017  1:00 AM   Dressing Status Clean, dry and intact 04/16/2017  1:00 AM       Peripheral IV 04/14/17 1856 Left Antecubital (Active)   Phlebitis Scale Grade 0 04/16/2017  9:22 AM   Infiltration Scale Grade 0 04/16/2017  9:22 AM   Line Status Flushed 04/16/2017  9:22 AM   Dressing Type Transparent 04/16/2017  9:22 AM   Dressing Status Clean, dry and intact 04/16/2017  9:22 AM       Respiratory: O2 Nasal Cannula 1 liters    Consentable:no    Alert and Oriented to person, place and time?: no    Code Status:full     Does patient wear an insulin pump? [] YES [] NO [] N/A     Precautions:none    Allergies: No Known Allergies (drug, envir, food or latex)    Angelena Form, RN Received Handoff Report from [facility] for IR procedure 10:39 AM    Clarified that pt is in hospital gown and will be on hover mat

## 2017-04-16 NOTE — Invasive Procedure Plan of Care (Signed)
Invasive Procedure Plan of Care (Consent Form 419):   Condition(s) Addressed: Liver/ biliary disease   Performing Provider: Marvis Moeller ELISA, and Vascular and Interventional Radiology Attendings, Fellows, Residents and Advanced Practice Providers     Side:    Procedure: Placement of a small, flexible, plastic tube through the skin into the liver in order to drain an obstructed bile duct system. Tube changes as needed for 12 months.   Special Equipment:    Planned Anesthesia: Pending   Benefits: Drainage of fluid from the gallbladder/liver/biliary tree, symptomatic improvement,    Risks: Allergic reaction to dye, bleeding, infection, sepsis, bile peritonitis, collapse of lung,bile leakage and collection in the abdomen, and in very rare circumstances death.   Alternatives: Not to perform the procedure   Expected Length of Stay: 0 day(s)     I, or a designated member of my surgical team, have discussed the planned procedure, including the potential for any transfusion of blood products or receipt of tissue as necessary, expected benefits, the potential complications and risks and possible alternatives and their benefits and risks with the patient or the patient's surrogate. In my opinion, the patent or the patient's surrogate understands the proposed procedure, its risks, benefits, and alternatives.    Electronically signed by West Pugh, MD at 11:33 AM     Patient Consent:  I hereby give my consent and authorize Tammie Yanda ELISA, and Vascular and Interventional Radiology Attendings, Fellows, Residents and Advanced Practice Providers    (The list of possible assistants, all of whom are privileged to provide surgical services at the hospital, is available)  To treat the following: Liver/ biliary disease  Procedure includes: Placement of a small, flexible, plastic tube through the skin into the liver in order to drain an obstructed bile duct system. Tube changes as needed  for 12 months.  Laterality:   1 The care provider has explained my condition to me, the benefits of having the above treatment procedure, and alternate ways of treating my condition. I understand that no guarantees have been made to me about the result of the treatment. The alternatives to this procedure include: Not to perform the procedure   2 The care provider has discussed with me the reasonably foreseeable risks of the treatment and that there may be undesirable results. The risks that are specifically related to this procedure include: Allergic reaction to dye, bleeding, infection, sepsis, bile peritonitis, collapse of lung,bile leakage and collection in the abdomen, and in very rare circumstances death.   3 I understand that during the treatment a condition may be discovered which was not known before the treatment started. Therefore, I authorize the care provider to perform any additional or different treatment which is thought necessary and available.   4 Any tissue, parts, or substances removed during the procedure may be retained or disposed of in accordance with customary scientific, educational and clinical practice.   5 Vendor information if appropriate:    6 If blood products are needed, I would agree:    7 If tissue products are needed, I would agree:    8 Blood/Tissue use limitations and/or exclusions:       I have carefully read and fully understand this informed consent form, and have had sufficient opportunity to discuss my condition and the above procedure(s) with the care provider and his/her associates, and all of my questions have been answered to my satisfaction. I understand that my surgeon/provider performing the procedure may not be physically present in  the operating/procedure room the entire time that I am there. My surgeon/provider has answered my questions regarding this and how it may relate to my surgery/procedure. I agree to the Plan of Care as outlined above.                        Patient Signature   (or Parent/Legal Guardian if pt is unable to sign or is a minor)  Date/Time     Electronic Signatures will display at the bottom of the consent form.

## 2017-04-16 NOTE — Provider Consult (Signed)
To assist in urgent pre-procedure management of this patient's risk for hemorrhage, with her INR of 1.8, have advised against the use of FFP at this time because (1) recent evidence suggests that FFP will likely not correct this level of INR, (2) the risk of post-procedure  hemorrhage is modest compared with the risk of  giving FFP (3)evidence that FFP actually contributes to post-procedure/surgery morbidity and mortality.    In patients with liver disease as the cause of INR elevation, the setting is actually pro-thrombotic rather than pro-hemorrhagic in most instances. Particularly true since aPTT is normal.  If there is evidence of hemostatic defect clinically (mucosal or cutaneous bleeding), the initial treatment, were time to allow, would be 10 mg IV vitamin K, followed, if necessary by a four factor (II, VII, IX, X) concentrate such as K-centra (off-label) if inadequate response to vitamin K.  FFP as the third choice if bleeding occurs intra- or post-procedure. FFP doubles the risk of mortality compared with K-centra in vitamin K antagonist reversal, as an example of the new information.    Please let us know if further advice is needed.    References:  Up-to-Date on liver disease    Lancet Haematol. 2016 Mar;3(3):e139-48. doi: 10.1016/S2352-3026(15)00283-5     [facility] Clin Proc. 2016;91(8):1045-1055    Charolette Child 2016; 116: [phone]

## 2017-04-16 NOTE — Progress Notes (Addendum)
Brief Surg Onc Note    CT abdomen/pelvis reviewed with Dr. Delma Post from radiology. There is some contrast entering the afferent limb but concerning for upstream distention/obstruction likely from mesenteric hematoma. There is associated PV compression and biliary tree distention. Questionable small fleck of free posterior to left liver lobe, but previous CT also had a small fleck in different spot.    HR 120-140  RR 18-36  BP 106/60  O2 Sat 92% room air    Opens eyes to voice. Minimally responds verbally. Looks uncomfortable  Abdomen is TTP throughout, worse in RLQ with focal guarding, incisions c/d/i    A/P: POD# 18 s/p whipple procedure with roux-en-y gastrojejunostomy. Course complicated by delayed gastric emptying. Now complicated by likely afferent limb distention with PV compression and biliary obstruction.    - STAT IR consult for PTC (INR pending and heparin gtt on hold)  - Start empiric zosyn given possible cholangitis   - Blood cultures if febrile  - Will give 500 cc bolus to see if HR improves  - Continue NGT to LIWS    Pt and plan discussed with Dr. Dennard Schaumann.    Worthy Keeler Kerstie Agent, MD  04/16/2017  11:23 AM      Called down to IR for RR in the 40s and desat to mid-high 80s on room air.  Per IR, respiratory status is insufficient for required amount of sedation for PTC.  After discussion with IR fellow and attending, decision made to intubate to perform procedure for sepsis control.  This was also discussed with Dr. Dennard Schaumann.  Appreciate, anesthesia, SICU and IR staff help in coordinating intubation/sedation as well as subsequent care.    Daughter, [name], was updated and is agreeable with intubation.    Donata Duff, MD  04/16/2017  2:11 PM

## 2017-04-16 NOTE — Progress Notes (Signed)
Surgical Oncology/Hepatobiliary Surgery Progress Note     LOS: 18 days     Subjective:    Having 10/10 abdominal pain  Concerns for acute delirium overnight. Pt unable to answer orientation questions.    Also had several episodes of being incontinent  LFTs elevated today: ALT 431 (214, 52), AST 566 (425, 34), ALP  987 (707, 356)  NGT output recorded as 1150cc over past 24 hours.   Unable to answer questions this AM. Groaning in pain.     Objective:    Vitals Sign Ranges for Past 24 Hours:  BP: (100-124)/(56-70)   Temp:  [35.6 C (96.1 F)-37.9 C (100.2 F)]   Temp src: Temporal (02/12 0340)  Heart Rate:  [78-104]   Resp:  [16-18]   SpO2:  [92 %-97 %]       Physical Exam:    General Appearance: groaning in pain, does not appear to be oriented  HEENT: NGT light gastric fluid output  Cardiac: RRR  Respiratory: non-labored  Abdomen: soft, minimally distended, tenderness to palpation in all quadrants, no rebound or guarding, midline incision c/d/i, pre-existing abdominal wall ecchyosis  Extremities: warm, well-perfused    Labs:    CBC:    Recent Labs  Lab 04/16/17  0100 04/15/17  1552 04/15/17  0412 04/15/17  0025 04/14/17  0005 04/13/17  0000 04/12/17  0021 04/11/17  0053   WBC 9.9  --   --  7.1 11.7* 9.4 9.7 9.4   Hemoglobin 6.7*  --  6.8* 6.6* 7.2* 7.2* 7.6* 7.9*   Hematocrit 21*  --  22* 21* 23* 22* 24* 25*   Platelets 413* 374*  --  379* 377* 353 355 098       Metabolic Panel:    Recent Labs  Lab 04/16/17  0100 04/15/17  0025 04/14/17  0005 04/13/17  0000 04/12/17  0021 04/11/17  0053   Sodium 131* 132* 135 135 136 133   Potassium 4.0 3.7 4.2 4.3 4.1 4.1   Chloride 95* 97 96 98 99 99   CO2 24 25 24 25 24 22    UN 21* 23* 22* 24* 20 14   Creatinine 0.74 0.70 0.72 0.71 0.73 0.76   Glucose 146* 132* 118* 133* 124* 123*   Calcium 8.1* 8.1* 8.4* 8.2* 8.3* 8.1*   Magnesium 1.9 1.9 1.9 2.0 1.8 2.1   Phosphorus 2.9 3.7 3.2 3.5 3.4 3.5          Assessment:    72 y.o. female with h/o recent PE and pancreatic cancer POD # 18   status post whipple procedure complicated by delayed gastric emptying.  NGT replaced on 04/04/17. Now with AMS,     Plan:    - CT abdomen and pelvis to evaluate for high grade obstruction in the setting of worsening abdominal pain and LFTs  - UA with reflex to culture  - Appreciate GI consult and discussion of EGD  - Protonix gtt  - Keep HOB elevated as much as possible  - NPO, TPN  - NGT to LIWS  - Discontinued Reglan 10mg  TID and erythromycin  - OOB to chair, ambulation, PT  - SCDs, therapeutic Lovenox for recent h/o PE    Gabriel Cirri, MD  04/16/2017     6:42 AM  General Surgery Resident

## 2017-04-16 NOTE — Progress Notes (Signed)
Patient intubated in procedural room. Anesthesia, SICU  Teams at bedside along side RNs and RRTs. Anesthesia administered, fentanyl, versed, rocuronium and etomidate.  ETT verified with end tidal colorchange  and equal breath sounds. IR procedure to start afterwards.  Phoebe Perch, RN

## 2017-04-16 NOTE — Plan of Care (Signed)
Nutrition     Patient's nutritional status is maintained or improved Maintaining        Post-Operative Complications     Patient will remain free from symptoms of infection-post op Maintaining        Post-Operative Hemodynamic Stability     Maintain Hemodynamic Stability Maintaining        Safety     Patient will remain free of falls Maintaining          Bowel Elimination     Elimination patterns are normal or improving Progressing towards goal        Cognitive function     Cognitive function will be maintained Progressing towards goal        Mobility     Functional status is maintained or improved - Geriatric Progressing towards goal        Pain/Comfort     Patient's pain or discomfort is manageable Progressing towards goal        Post-Operative Bowel Elimination     Elimination pattern is normal or improving Progressing towards goal

## 2017-04-16 NOTE — H&P (Addendum)
SICU History and Physical Note    Date seen: 04/16/17    CC: Respiratory Distress    HPI: Amanda Galloway is a 72 y.o. female with PMHx PE, pancreatic adenocarcinoma s/p uncomplicated Whipple 5/64/33 with Dr Ron Agee.  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  Discharged to the floor 1/28.  Developed n/v beginning 1/31 and NGT placed with continued high output and concern for gastric outlet obstruction distal to the gastro-jejunostomy site.  On 2/12 developed tachypnea, mental status changes, and worsening transaminitis.  CT ABD/pelvis demonstrated a hematoma causing severe compression of the main portal vein and common bile duct.  Decision was made to place an IR percutaneous bilary drain.  Prior to placement patient had increased WOB and was intubated for the procedure.  Transferred to SICU post-procedure for further management.      ROS : Review of Systems   Unable to perform ROS: Acuity of condition         Past Medical History:  Past Medical History:   Diagnosis Date    Acute kidney failure 03/31/2017    Cancer     Depression     Fibromyalgia     Hypothyroidism        Past Surgical History:  Past Surgical History:   Procedure Laterality Date    CHOLECYSTECTOMY      CHOLECYSTECTOMY, LAPAROSCOPIC  09/06/2016    HYSTERECTOMY  08/1986    Fibroids    KNEE SURGERY Right     PR INSERT TUNNELED CV CATH WITH PORT Right 10/04/2016    Procedure: Right IJ MEDIPORT Insertion;  Surgeon: Delano Metz, MD;  Location: [facility] MAIN OR;  Service: Oncology General    PR LAP,DIAGNOSTIC ABDOMEN N/A 10/04/2016    Procedure: LAPAROSCOPY DIAGNOSTIC;  Surgeon: Delano Metz, MD;  Location: [facility] MAIN OR;  Service: Oncology General    PR PART [place] PANC,PROX+REMV DUOD+ANAST N/A 03/29/2017    Procedure: WHIPPLE PROCEDURE;  Surgeon: Delano Metz, MD;  Location: [facility] MAIN OR;  Service: Oncology General    ROTATOR CUFF REPAIR Right     TONSILLECTOMY AND ADENOIDECTOMY         No Known  Allergies (drug, envir, food or latex)    Social Hx:  Social History     Social History    Marital status: Divorced     Spouse name: N/A    Number of children: N/A    Years of education: N/A     Occupational History    Not on file.     Social History Main Topics    Smoking status: Never Smoker    Smokeless tobacco: Never Used    Alcohol use No    Drug use: No    Sexual activity: Not on file     Social History Narrative    No narrative on file         Family Hx:   Family History   Problem Relation Age of Onset    Breast cancer Mother         died 76    Cancer Father         NHL, died 43    Heart Disease Father     Diabetes Sister     Obesity Sister         4 siblings       Physical Exam by Systems:  Constitutional: Intubated, sedated  Eyes: PERRLA, sclera anicteric  Ears, Nose, Mouth, and  Throat: ETT in place  Cardiovascular: Regular @ 120 bpm  Respiratory: CTA B  Gastrointestinal: soft, NT, midline surgical wound c/d/i  Genitourinary: Foley in place with dark yellow urine  Musculoskeletal: No deformities noted  Skin: Large dark purple bruise periumbilical region  Neurologic: grossly intact  Psychiatric: not agitated    Assessment and Plan for Active/Followed Hospital Problems:   Active Hospital Problems    Diagnosis    *!*Pancreatic adenocarcinoma s/p Whipple 03/29/17     -S/p open Whipple on 1/25 with Dr. Ron Agee  -Developed gastric outlet obstruction 2/2 hematoma causing severe compression of the main portal vein and common bile duct.  -s/p external/internal PTC drain 2/12 in IR  -Started on Zosyn 2/12  -Continue Decadron for PONV  -Continue TPN with SSI for glucose control        Acute respiratory failure     -Intubated 2/12 for PTC placement  -Wean vent as tolerated  -Propofol for sedation       hx of Pulmonary embolism     - PE 01/18/17 on CT chest   -Had been on therapeutic Lovenox but transitioned to Heparin gtt prior to [facility]  -Awaiting surgery input as to when to restart      Acute blood loss  anemia     -Stable acceptable anemia  -Did receive 1 uFFP for INR=1.8 prior to PTC placement      Cancer associated pain     - Methadone at home  -Pain management being guided by patient's primary pain physician Dr Irven Baltimore  -Continue methadone at 5ng IV BID (09:00 & 21:00) and 7.5 mg IV qDay @ 13:00  -Fenatnyl gtt while intubated and then would transition back to dilaudid PCA        Depression     -Continue Elavil and Lexapro      Hypothyroidism     -Synthroid             Diagnoses for this hospitalization include:  Encephalopathy, Acute Respiratory Failure, Severe Sepsis and Shock  Septic      ---Critical Care Bundle---    Fluids & Electrolytes: Bolus PL prn    Nutrition / last BM: TPN    Mobility plan: As tol    Neuro / sedation / pain / spontaneous awakening: Propofol and Fentanyl    Respiratory weaning (and PST, TC) / pulmonary toilet:  Vent    Delirium / sleep / prevention / intervention: N/A    List of antimicrobials: Zosyn    Drains / Lines / Tubes: PIV *2, Foley, ETT    Indication for foley: Strict I/Os    DVT prophylaxis / a PTT: SCDs    Indications for gastric acid suppression: Home med    Labs ordered: a Day CBC, CMP, coags, lactate    Family concern / updates: by team    Patient likes to be called: Amanda Galloway    Code status: Full    Disposition / Level: SICU    Nursing reiterate plan for day: Y/N Y    Author: Helene Shoe, PA  as of: 04/16/2017  at: 5:19 PM

## 2017-04-16 NOTE — Invasive Procedure Plan of Care (Signed)
Invasive Procedure Plan of Care (Consent Form 419):   Condition(s) Addressed: Need for moderate sedation during procedure/minor surgery.   Performing Provider: Marvis Moeller ELISA   Side: Not applicable    Procedure: Establishment of moderate sedation.   Special Equipment: None   Planned Anesthesia:    Benefits: State of altered consciousness created by administration of intravenous medications (medications in an IV).  Patients under moderate sedation will feel sleepy/relaxed, but will respond to commands and may have memory of the procedure.  The purpose of moderate sedation is to make you feel comfortable during your procedure, and can make your procedure easier to perform.     Risks: Excessive sedation, which may require special procedures to keep you safe, including placement of a breathing tube.  Nausea, vomiting, problems with heart rate or blood pressure, breathing difficilties, very rarely death. Occasionally, vivid dreams or agitation requiring additional medication.   Alternatives: Not undergoing procedure, undergoing procedure without sedation.   Expected Length of Stay:  day(s)     I, or a designated member of my surgical team, have discussed the planned procedure, including the potential for any transfusion of blood products or receipt of tissue as necessary, expected benefits, the potential complications and risks and possible alternatives and their benefits and risks with the patient or the patient's surrogate. In my opinion, the patent or the patient's surrogate understands the proposed procedure, its risks, benefits, and alternatives.    Electronically signed by West Pugh, MD at 11:34 AM     Patient Consent:  I hereby give my consent and authorize Janiece Scovill ELISA  (The list of possible assistants, all of whom are privileged to provide surgical services at the hospital, is available)  To treat the following: Need for moderate sedation during  procedure/minor surgery.  Procedure includes: Establishment of moderate sedation.  Laterality: Not applicable  1 The care provider has explained my condition to me, the benefits of having the above treatment procedure, and alternate ways of treating my condition. I understand that no guarantees have been made to me about the result of the treatment. The alternatives to this procedure include: Not undergoing procedure, undergoing procedure without sedation.   2 The care provider has discussed with me the reasonably foreseeable risks of the treatment and that there may be undesirable results. The risks that are specifically related to this procedure include: Excessive sedation, which may require special procedures to keep you safe, including placement of a breathing tube.  Nausea, vomiting, problems with heart rate or blood pressure, breathing difficilties, very rarely death. Occasionally, vivid dreams or agitation requiring additional medication.   3 I understand that during the treatment a condition may be discovered which was not known before the treatment started. Therefore, I authorize the care provider to perform any additional or different treatment which is thought necessary and available.   4 Any tissue, parts, or substances removed during the procedure may be retained or disposed of in accordance with customary scientific, educational and clinical practice.   5 Vendor information if appropriate: If a vendor representative is expected to be present during my procedure, it has been explained to me that the vendor representative works for (manufacturer of the device to be used) and that his/her role includes . I consent to the vendor representative's presence and involvement as described. If circumstances change and a decision is made during my procedure that a vendor representative's presence is needed, I will be notified of the above after my procedure is  completed.  Equipment: None   6 If blood products are  needed, I would agree:    7 If tissue products are needed, I would agree:    8 Blood/Tissue use limitations and/or exclusions:       I have carefully read and fully understand this informed consent form, and have had sufficient opportunity to discuss my condition and the above procedure(s) with the care provider and his/her associates, and all of my questions have been answered to my satisfaction. I understand that my surgeon/provider performing the procedure may not be physically present in the operating/procedure room the entire time that I am there. My surgeon/provider has answered my questions regarding this and how it may relate to my surgery/procedure. I agree to the Plan of Care as outlined above.                       Patient Signature   (or Parent/Legal Guardian if pt is unable to sign or is a minor)  Date/Time     Electronic Signatures will display at the bottom of the consent form.

## 2017-04-16 NOTE — Progress Notes (Signed)
[facility] is aware the patient was transferred to [id number] and will continue to follow her inpatient course.  Tyson Babinski RN [place]

## 2017-04-16 NOTE — Anesthesia Procedure Notes (Signed)
---------------------------------------------------------------------------------------------------------------------------------------    AIRWAY   GENERAL INFORMATION AND STAFF    Patient location during procedure: Procedure Rm       Date of Procedure: 04/16/2017 2:15 PM  CONDITION PRIOR TO MANIPULATION     Current Airway/Neck Condition:  Normal        For more airway physical exam details, see Anesthesia PreOp Evaluation  AIRWAY METHOD     Patient Position:  Sniffing    Preoxygenated: yes      Induction: IV, RSI and NDMR  Mask Difficulty Assessment:  0 - not attempted      Technique Used for Successful ETT Placement:  Direct laryngoscopy    Devices/Methods Used in Placement:  Intubating stylet and cricoid pressure    Blade Type:  C-MAC    Cormack-Lehane Classification:  Grade IIb - view of arytenoids or posterior of glottis only    Placement Verified by: capnometry, auscultation and equal breath sounds      Number of Attempts at Approach:  1    Number of Other Approaches Attempted:  0  FINAL AIRWAY DETAILS    Final Airway Type:  Endotracheal airway    Final Endotracheal Airway:  ETT      Cuffed: cuffed    Insertion Site:  Oral    ETT Size (mm):  8.0    Distance inserted from Lips (cm):  22  ADDITIONAL COMMENTS   Assisted by [place], CA3. Patient has small mouth opening and limited neck mobility. Likely aspirated gastric fluid + heme seen by glidescope which partially obstructed the view, unable to fully suction the fluid, able to see the posterior arytenoids and intubate on first attempt. Patient was desaturated to low 40s during this challenging intubation and quickly re-resaturated to high 90s with ambu bag vention through the ETT. Other vital signs remained stable during intubation.  ----------------------------------------------------------------------------------------------------------------------------------------  STAFF     Performed by: Extender under direct supervision  Extender: Isaias Sakai

## 2017-04-16 NOTE — Progress Notes (Signed)
[employer] Nursing Procedure Note    Amanda Galloway  [id number]    Procedure: PTC        Status: Completed    Patient experienced the following problems intraprocedurally required intubation for completion of procedure     Specimen Collection: no    Sponge count: N/A    Fluid Removed:N/A  Procedure Dressing Site located:Abdomen  Dressing Type:sterile gauze/tegaderm  Biopatch:no  Dressing status:Clean, dry and intact   Hematoma:Not evident  Medication received:per ICU  Cardiovascular:   Peripheral Pulses: N/A      Fistula: N/A    Neuro Assessment:N/A    Implant patient information given to patient or parent/guardian:N/A    Report given to:Unit Nurse. Unit 836 Name Leslie, RN      Last Filed Vitals    04/16/17 1610   BP: 122/63   Pulse: (!) 127   Resp: (!) 25   Temp:    SpO2: 96%

## 2017-04-16 NOTE — Procedures (Signed)
Procedure Report    Please enter procedure, pre- and post-procedure diagnoses in fields above and remove this line of text.    ARTERIAL LINE PROCEDURE NOTE    Time out documentation completed in Procedure navigator prior to procedure:  Yes    Indications  hypotension    Procedure Details  Successful placement: Yes  Side: left  Arterial Site: brachial artery  Attempted Sites: Left brachial artery      1% lidocaine S.Q. N/A  Artery cannulated with 20G catheter  Kit Lot #: [id number]  Technique: with wire guide and ultrasound  Correct placement confirmed by: transducer  Secured by: suturing and bioocclusive dressing  Guide Wire Removed : yes    Findings  hypotensive    Complications  none    Condition  Patient tolerated procedure well. Yes  Patients condition at end of procedure: no change from prior    Plan/Orders  oabg    EBL  539m   Disposition  sicu  Time Out    Procedure: arterial line  Side: left   Vascular procedure/s in past ? n  -If yes, does it affect current procedure?  Chest tube in place? n  Is the primary team aware of procedure ? y  Is the ICU attending aware of procedure ? y  Correct Procedure: y  Correct site: Y/N y  Correct Patient Position: Y/Estevan OaksNP  04/16/2017  9:24 PM

## 2017-04-16 NOTE — Procedures (Signed)
INTERVENTIONAL RADIOLOGY     Brief Procedure Note:    Patient name: Amanda Galloway    Pt MRN: [id number]    Date of procedure: 04/16/2017  Procedure(s): PTC and R sided internal/external biliary drain placement   Sedation method: moderate sedation managed by SICU  Pre Procedure Diagnosis: pancreatic adenoCA s/p whipple  Post Procedure Diagnosis: same  Indications: biliary dilation    Primary Attending: Mart Piggs, MD  Fellow: Alphia Moh, MD  Resident: None- unless otherwise specified    Anesthesia:10 cc local lidocaine   Specimen(s) removed: None- unless otherwise specified  Additional studies ordered: None- unless otherwise specified  Drains: 8 Fr biliary drain  Estimated Blood Loss: Minimal- unless otherwise specified  Complications: None- unless otherwise specified  Vascular closure method: n/a    Findings/Notes/Comments:   - successful PTC demonstrating mild to moderately enlarged biliary system.  - subsequent successful placement of 8 Fr internal/external biliary catheter.  - to gravity bag.  - NO flushing.  - routine exchange in 6 wks.  - monitor for sepsis.      Alphia Moh, MD  Interventional Radiology Fellow  04/16/2017  4:46 PM

## 2017-04-16 NOTE — Progress Notes (Signed)
Report Given   Diagnostic Imaging Nurse Handoff:    Study: CT  A&O: no   Telemetry: yes   IV Access: peripheral   Precautions: falls   Transport: bed with hover   Contrast (Oral/NG): NG  Respiratory: room air   Report Received From: Benay Pillow on [facility]

## 2017-04-16 NOTE — Plan of Care (Signed)
Bowel Elimination     Elimination patterns are normal or improving Maintaining        Cognitive function     Cognitive function will be maintained Maintaining        Mobility     Functional status is maintained or improved - Geriatric Maintaining        Nutrition     Patient's nutritional status is maintained or improved Maintaining        Pain/Comfort     Patient's pain or discomfort is manageable Maintaining        Post-Operative Bowel Elimination     Elimination pattern is normal or improving Maintaining        Post-Operative Complications     Patient will remain free from symptoms of infection-post op Maintaining        Post-Operative Hemodynamic Stability     Maintain Hemodynamic Stability Maintaining        Safety     Patient will remain free of falls Maintaining

## 2017-04-16 NOTE — Progress Notes (Signed)
Received patient from [facility]. Concern that patient would not be a criteria for moderate sedation due to tachypnea (respirations in 40s), oxygen desaturation in 87 - patient placed on supplemental oxygen to maintain oxygen saturation above 92%. Tachycardic as well. Dr. Marylene Buerger and Dr. Truman Hayward at bedside.  Patient's primary team (nursing and providers)  Notified. Phoebe Perch, RN

## 2017-04-16 NOTE — Progress Notes (Signed)
Critical Care Medicine Attending Note:      If there are key historical elements or objective findings that I think deserve particular emphasis, I have recorded them below. A summary of key elements of our assessment and plans is listed in the resident / medical student / PA / NP's note. Please refer to this note for complete details of our mutually agreed upon findings, assessment, and recommendations.  The problem list reflects my input.    Summary of history of present illness:  The patient is a 72 year old woman with a medical history significant for hypertension, pulmonary embolism (diagnosed in November 2018 (on Lovenox), fibromyalgia, gastroesophageal reflux disease, hypothyroidism, and anxiety/depression. 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 She had a cholecystectomy that failed to relieve her symptoms. She has completed neoadjuvant chemotherapy and radiation therapy. She has required methadone for chronic abdominal pain. On 1/25, she underwent a Whipple procedure. The surgical procedure and anesthetic were uncomplicated. She was admitted to the SICU service for ongoing management and the expectation of complicated pain management.  She did well and was discharged to the floor on 1/28.   She developed nausea and vomiting on 1/31 prompting NG tube placement.  She has since had high gastric output requiring TPN for nutrition.  On 2/12 (today), she developed tachypnea and confusion.  She had a worsening transaminitis.  CT of the abdomen/pelvis revealed a hematoma compressing the portal vein and common bile duct.  She was taken to IR where she underwent placement of a percutaneous biliary drain.  She had to be intubated for the procedure due to her marginal respiratory status.  She was admitted to the SICU service for close observation and ongoing management.    Interval  history:  She had significant oxygenation issues on arrival to the SICU but improved with a lung opening procedure.    Objective Section:  BP: (80-161)/(39-80)   Temp:  [36.6 C (97.9 F)-37.8 C (100 F)]   Temp src: Temporal (02/12 1700)  Heart Rate:  [85-132]   Resp:  [16-45]   SpO2:  [78 %-100 %]     FiO2: 60 % (04/16/17 1658) O2 Flow Rate: 2 L/min (04/16/17 1320)      Intake/Output last 3 shifts:  I/O last 3 completed shifts:  02/11 1500 - 02/12 1459  In: 3967.2 (54 mL/kg) [I.V.:538.1 (0.3 mL/kg/hr); NG/GT:360; IV Piggyback:1050]  Out: 1300 (17.7 mL/kg) [Urine:600 (0.3 mL/kg/hr); Emesis/NG output:700]  Net: 2667.2  Weight: 73.5 kg     Intake/Output this shift:  I/O this shift:  02/12 1500 - 02/12 2259  In: 2034.5 (27.7 mL/kg) [I.V.:31.5; Blood:300; NG/GT:90; IV Piggyback:1100]  Out: 1000 (13.6 mL/kg) [Urine:1000]  Net: 1034.5  Weight: 73.5 kg       Vent settings for last 24 hours:  Ventilator: Drager  MODE: CMV+  Ventilator On: Yes  FiO2: 60 %  AutoFlow: On  S RR: 14  Vt: 450 mL  PEEP: 8 cm H20  IT: 0.1 sec  Rise Time/Slope: 0.2  Sensitivity: 2  A RR: 35  PIP: 16 cm H2O  MAP: 12 cmH2O  MV: 15.9 l/min  A VT: 486 mL  Vt exp: 432 mL    Physical exam:      General: The patient is on the ventilator and is in no apparent distress.  She is sedated.  She opens her eyes weakly to  voice but does not follow commands.    Pulmonary: Symmetric breath sounds, essentially clear bilaterally.     Cardiac: Moderately tachycardic, regular rhythm.     Abdominal: Absent bowel sounds, softly distended.  The midline incision is healing well.      Extremities: Warm, pink, minimal or no edema.      Labs:  Lab Results   Component Value Date    WBC 2.9 (L) 04/16/2017    HCT 23 (L) 04/16/2017    PLT 413 (H) 04/16/2017     Lab Results   Component Value Date    NA 131 (L) 04/16/2017    K 3.6 04/16/2017    CL 96 04/16/2017    CO2 19 (L) 04/16/2017    UN 20 04/16/2017    CREAT 0.73 04/16/2017    WBGLU 179 (H) 04/16/2017    PGLU 142 (H)  04/16/2017     Lab Results   Component Value Date    CA 7.5 (L) 04/16/2017    MG 1.6 04/16/2017    PO4 2.9 04/16/2017     Lab Results   Component Value Date    PTI 20.1 (H) 04/16/2017    INR 1.8 (H) 04/16/2017    PTT 31.3 04/16/2017     Lab Results   Component Value Date    ALT 271 (H) 04/16/2017    AST 221 (H) 04/16/2017    ALK 669 (H) 04/16/2017     Bilirubin,Total   Date Value Ref Range Status   04/16/2017 1.4 (H) 0.0 - 1.2 mg/dL Final           Lab results: 04/16/17  1718  03/30/17  0843   Lactate ART,WB  --   --  3.7*   Lactate VEN,WB 4.6*  < >  --    < > = values in this interval not displayed.           Currently Active/Followed Hospital Problems:  Active Hospital Problems    Diagnosis    *!*Pancreatic adenocarcinoma s/p Whipple 03/29/17     -S/p open Whipple on 1/25 with Dr. Ron Agee  -Developed gastric outlet obstruction 2/2 hematoma causing severe compression of the main portal vein and common bile duct.  -s/p external/internal PTC drain 2/12 in IR  -Started on Zosyn 2/12  -Continue Decadron for PONV  -Continue TPN with SSI for glucose control        Acute respiratory failure     -Intubated 2/12 for PTC placement  -Wean vent as tolerated  -Propofol for sedation       hx of Pulmonary embolism     - PE 01/18/17 on CT chest   -Had been on therapeutic Lovenox but transitioned to Heparin gtt prior to [facility]  -Awaiting surgery input as to when to restart      Acute blood loss anemia     -Stable acceptable anemia  -Did receive 1 uFFP for INR=1.8 prior to PTC placement      Cancer associated pain     - Methadone at home  -Pain management being guided by patient's primary pain physician Dr Irven [place]  -Continue methadone at 5ng IV BID (09:00 & 21:00) and 7.5 mg IV qDay @ 13:00  -Fenatnyl gtt while intubated and then would transition back to dilaudid PCA        Depression     -Continue Elavil and Lexapro      Hypothyroidism     -Synthroid  Attending summary impressions:     The patient is critically ill  due to biliary sepsis in setting of obstruction of the afferent limb of the reux anastomosis for a Whipple procedure on 1/25.  The dilation of the reux limb was relieved with the percutaneous biliary drain that extends into the obstructed limb.   The patient has a moderately significant systemic inflammatory response syndrome (SIRS).  She is hypotensive and tachycardic.  Will place an arterial line and resuscitate as needed with IV fluids.   She has acute respiratory failure.   She had significant hypoxia on arrival but responded nicely to a lung opening procedure.   Anticipate her needing mechanical ventilation at least until the sepsis resolves.   She has severe sepsis with hemodynamic instability and leukocytopenia.  She was placed on Zosyn this morning.  Will add vancomycin to cover enterococcus.   Will add antifungal coverage if she does not improve in the near future.   She has mild to moderate shock liver with significantly elevated transaminases and some cholestasis.     She has had excellent urine output with normal renal function tests.  Will monitor her urine output closely.   The patient has acceptable anemia and a stable hematocrit.  No transfusion is needed.    She has significant pain issue due to long term narcotic use for pain associated with her cancer.  We are resuming her standing methadone dose.   Discussed with the surgical team.  I also spoke with her daughters.        Attending Attestation    This is a high complexity patient.     '[X]'  This patient is critically ill with at least 1 organ system failure including severe sepsis, acute respiratory failure, shock liver anemia. The care I have delivered involved high complexity decision making to assess, manipulate, and support vital system function(s), to treat the vital organ system failure and/or prevent further deterioration of the patient's condition. All nursing documentation, laboratory data, test results, and radiographs were  reviewed and interpreted by me. I have established the management plan for this patient's critical illness and have been immediately available to assist with patient care. My documented total critical care time reflects my own patient care time and does not include teaching or procedure time.     Critical care time: Personal time spent - (exclusive of performance of any procedures performed in the last 24 hours):  40 minutes    Signed by: Lorenza Chick, MD as of 04/16/2017 at 7:10 PM.

## 2017-04-17 ENCOUNTER — Inpatient Hospital Stay: Payer: Medicare (Managed Care)

## 2017-04-17 ENCOUNTER — Encounter: Payer: Medicare (Managed Care) | Admitting: Gastroenterology

## 2017-04-17 DIAGNOSIS — Z452 Encounter for adjustment and management of vascular access device: Secondary | ICD-10-CM

## 2017-04-17 DIAGNOSIS — R579 Shock, unspecified: Secondary | ICD-10-CM

## 2017-04-17 DIAGNOSIS — A419 Sepsis, unspecified organism: Secondary | ICD-10-CM

## 2017-04-17 DIAGNOSIS — R6521 Severe sepsis with septic shock: Secondary | ICD-10-CM | POA: Diagnosis not present

## 2017-04-17 LAB — ART GASES / WHOLE BLOOD PANEL
Base Excess, Arterial: -1 mmol/L (ref <=2)
Base Excess, Arterial: -2 mmol/L (ref <=2)
Base Excess, Arterial: -2 mmol/L (ref <=2)
Base Excess, Arterial: -2 mmol/L (ref <=2)
Base Excess, Arterial: -2 mmol/L (ref <=2)
Base Excess, Arterial: 0 mmol/L (ref <=2)
Base Excess, Arterial: 0 mmol/L (ref <=2)
CO2,ART (Calc): 22 mmol/L — ABNORMAL LOW (ref 23–28)
CO2,ART (Calc): 22 mmol/L — ABNORMAL LOW (ref 23–28)
CO2,ART (Calc): 22 mmol/L — ABNORMAL LOW (ref 23–28)
CO2,ART (Calc): 23 mmol/L (ref 23–28)
CO2,ART (Calc): 23 mmol/L (ref 23–28)
CO2,ART (Calc): 24 mmol/L (ref 23–28)
CO2,ART (Calc): 25 mmol/L (ref 23–28)
CO: 0 %
CO: 0.2 %
CO: 0.3 %
CO: 0.3 %
CO: 0.4 %
CO: 0.4 %
CO: 0.5 %
FO2 Hb, Arterial: 90 % (ref 90–100)
FO2 Hb, Arterial: 91 % (ref 90–100)
FO2 Hb, Arterial: 93 % (ref 90–100)
FO2 Hb, Arterial: 93 % (ref 90–100)
FO2 Hb, Arterial: 93 % (ref 90–100)
FO2 Hb, Arterial: 94 % (ref 90–100)
FO2 Hb, Arterial: 96 % (ref 90–100)
Glucose,WB: 113 mg/dL — ABNORMAL HIGH (ref 60–99)
Glucose,WB: 137 mg/dL — ABNORMAL HIGH (ref 60–99)
Glucose,WB: 184 mg/dL — ABNORMAL HIGH (ref 60–99)
Glucose,WB: 219 mg/dL — ABNORMAL HIGH (ref 60–99)
Glucose,WB: 223 mg/dL — ABNORMAL HIGH (ref 60–99)
Glucose,WB: 75 mg/dL (ref 60–99)
Glucose,WB: 98 mg/dL (ref 60–99)
HCO3, Arterial: 21 mmol/L (ref 21–26)
HCO3, Arterial: 21 mmol/L (ref 21–26)
HCO3, Arterial: 21 mmol/L (ref 21–26)
HCO3, Arterial: 22 mmol/L (ref 21–26)
HCO3, Arterial: 22 mmol/L (ref 21–26)
HCO3, Arterial: 23 mmol/L (ref 21–26)
HCO3, Arterial: 24 mmol/L (ref 21–26)
Hemoglobin: 6.7 g/dL — ABNORMAL LOW (ref 11.2–15.7)
Hemoglobin: 7.3 g/dL — ABNORMAL LOW (ref 11.2–15.7)
Hemoglobin: 7.5 g/dL — ABNORMAL LOW (ref 11.2–15.7)
Hemoglobin: 7.5 g/dL — ABNORMAL LOW (ref 11.2–15.7)
Hemoglobin: 7.7 g/dL — ABNORMAL LOW (ref 11.2–15.7)
Hemoglobin: 7.7 g/dL — ABNORMAL LOW (ref 11.2–15.7)
Hemoglobin: 8.6 g/dL — ABNORMAL LOW (ref 11.2–15.7)
ICA @7.4,WB: 4.4 mg/dL — ABNORMAL LOW (ref 4.8–5.2)
ICA @7.4,WB: 4.4 mg/dL — ABNORMAL LOW (ref 4.8–5.2)
ICA @7.4,WB: 4.4 mg/dL — ABNORMAL LOW (ref 4.8–5.2)
ICA @7.4,WB: 4.5 mg/dL — ABNORMAL LOW (ref 4.8–5.2)
ICA @7.4,WB: 4.6 mg/dL — ABNORMAL LOW (ref 4.8–5.2)
ICA @7.4,WB: 4.6 mg/dL — ABNORMAL LOW (ref 4.8–5.2)
ICA @7.4,WB: 4.6 mg/dL — ABNORMAL LOW (ref 4.8–5.2)
ICA Uncorr,WB: 4.2 mg/dL
ICA Uncorr,WB: 4.2 mg/dL
ICA Uncorr,WB: 4.3 mg/dL
ICA Uncorr,WB: 4.3 mg/dL
ICA Uncorr,WB: 4.4 mg/dL
ICA Uncorr,WB: 4.4 mg/dL
ICA Uncorr,WB: 4.5 mg/dL
Lactate ART,WB: 2.4 mmol/L — ABNORMAL HIGH (ref 0.3–0.8)
Lactate ART,WB: 2.4 mmol/L — ABNORMAL HIGH (ref 0.3–0.8)
Lactate ART,WB: 3.6 mmol/L (ref 0.3–0.8)
Lactate ART,WB: 4 mmol/L (ref 0.3–0.8)
Lactate ART,WB: 4.1 mmol/L (ref 0.3–0.8)
Lactate ART,WB: 4.1 mmol/L (ref 0.3–0.8)
Lactate ART,WB: 4.3 mmol/L (ref 0.3–0.8)
Methemoglobin: 0.3 % (ref 0.0–1.0)
Methemoglobin: 0.4 % (ref 0.0–1.0)
Methemoglobin: 0.5 % (ref 0.0–1.0)
Methemoglobin: 0.5 % (ref 0.0–1.0)
Methemoglobin: 0.6 % (ref 0.0–1.0)
Methemoglobin: 0.6 % (ref 0.0–1.0)
Methemoglobin: 1.3 % — ABNORMAL HIGH (ref 0.0–1.0)
NA, WB: 127 mmol/L — ABNORMAL LOW (ref 135–145)
NA, WB: 127 mmol/L — ABNORMAL LOW (ref 135–145)
NA, WB: 127 mmol/L — ABNORMAL LOW (ref 135–145)
NA, WB: 127 mmol/L — ABNORMAL LOW (ref 135–145)
NA, WB: 127 mmol/L — ABNORMAL LOW (ref 135–145)
NA, WB: 127 mmol/L — ABNORMAL LOW (ref 135–145)
NA, WB: 128 mmol/L — ABNORMAL LOW (ref 135–145)
O2 Sat, Arterial: 91 % — ABNORMAL LOW (ref 94–100)
Potassium,WB: 3 mmol/L — ABNORMAL LOW (ref 3.4–4.7)
Potassium,WB: 3 mmol/L — ABNORMAL LOW (ref 3.4–4.7)
Potassium,WB: 3.2 mmol/L — ABNORMAL LOW (ref 3.4–4.7)
Potassium,WB: 3.2 mmol/L — ABNORMAL LOW (ref 3.4–4.7)
Potassium,WB: 3.2 mmol/L — ABNORMAL LOW (ref 3.4–4.7)
Potassium,WB: 3.3 mmol/L — ABNORMAL LOW (ref 3.4–4.7)
Potassium,WB: 3.5 mmol/L (ref 3.4–4.7)
pCO2, Arterial: 27 mm Hg — ABNORMAL LOW (ref 35–46)
pCO2, Arterial: 27 mm Hg — ABNORMAL LOW (ref 35–46)
pCO2, Arterial: 29 mm Hg — ABNORMAL LOW (ref 35–46)
pCO2, Arterial: 30 mm Hg — ABNORMAL LOW (ref 35–46)
pCO2, Arterial: 32 mm Hg — ABNORMAL LOW (ref 35–46)
pCO2, Arterial: 33 mm Hg — ABNORMAL LOW (ref 35–46)
pCO2, Arterial: 34 mm Hg — ABNORMAL LOW (ref 35–46)
pH: 7.45 (ref 7.35–7.45)
pH: 7.46 — ABNORMAL HIGH (ref 7.35–7.45)
pH: 7.46 — ABNORMAL HIGH (ref 7.35–7.45)
pH: 7.49 — ABNORMAL HIGH (ref 7.35–7.45)
pH: 7.5 — ABNORMAL HIGH (ref 7.35–7.45)
pH: 7.51 — ABNORMAL HIGH (ref 7.35–7.45)
pH: 7.51 — ABNORMAL HIGH (ref 7.35–7.45)
pO2,Arterial: 56 mm Hg — ABNORMAL LOW (ref 80–100)
pO2,Arterial: 57 mm Hg — ABNORMAL LOW (ref 80–100)
pO2,Arterial: 66 mm Hg — ABNORMAL LOW (ref 80–100)
pO2,Arterial: 67 mm Hg — ABNORMAL LOW (ref 80–100)
pO2,Arterial: 67 mm Hg — ABNORMAL LOW (ref 80–100)
pO2,Arterial: 70 mm Hg — ABNORMAL LOW (ref 80–100)
pO2,Arterial: 85 mm Hg (ref 80–100)

## 2017-04-17 LAB — ECHO COMPLETE
Aortic Diameter (sinus of Valsalva): 3.3 cm
BMI: 25.4 kg/m2
BP Diastolic: 55 mmHg
BP Systolic: 92 mmHg
BSA: 1.86 m2
Deceleration Time - MV: 207.54 ms
E/A ratio: 0.97
Echo RV Stroke Work Index Estimate: 537.8 mmHg•mL/m2
Heart Rate: 92 {beats}/min
Height: 67 in
IVC Diameter: 1.3 cm
LA Systolic Vol BSA Index: 20 mL/m2
LA Systolic Vol Height Index: 21.9 mL/m
LA Systolic Volume: 37.2 mL
LV ASE Mass BSA Index: 65.7 gm/m2
LV ASE Mass Height 2.7 Index: 29.1 gm/m2.7
LV ASE Mass Height Index: 71.8 gm/m
LV ASE Mass: 122.1 gm
LV CO BSA Index: 2 L/min/m2
LV Cardiac Output: 3.73 L/min
LV Diastolic Volume Index: 34.1 mL/m2
LV Posterior Wall Thickness: 1 cm
LV SV - LVOT SV Diff: -7.26 mL
LV SV BSA Index: 21.8 mL/m2
LV SV Height Index: 23.8 mL/m
LV Septal Thickness: 1 cm
LV Stroke Volume: 40.5 mL
LV Systolic Volume Index: 12.4 mL/m2
LVED Diameter BSA Index: 2.1 cm/m2
LVED Diameter Height Index: 2.3 cm/m
LVED Diameter: 3.9 cm
LVED Volume BSA Index: 34 ml/m2
LVED Volume BSA Index: 34.1 mL/m2
LVED Volume Height Index: 37.3 mL/m
LVED Volume: 63.5 mL
LVEF (Volume): 64 %
LVES Volume BSA Index: 12 ml/m2
LVES Volume BSA Index: 12.4 mL/m2
LVES Volume Height Index: 13.5 mL/m
LVES Volume: 23 mL
LVOT Area (calculated): 2.24 cm2
LVOT Cardiac Index: 2.36 L/min/m2
LVOT Cardiac Output: 4.39 L/min
LVOT Diameter: 1.69 cm
LVOT PWD VTI: 21.3 cm
LVOT PWD Velocity (mean): 95.2 cm/s
LVOT PWD Velocity (peak): 139.9 cm/s
LVOT SV BSA Index: 25.67 mL/m2
LVOT SV Height Index: 28.1 mL/m
LVOT Stroke Rate (mean): 213.4 mL/s
LVOT Stroke Rate (peak): 313.7 mL/s
LVOT Stroke Volume: 47.76 cc
MR Regurgitant Fraction (LV SV Mtd): -0.18
MR Regurgitant Volume (LV SV Mtd): -7.3 mL
MV Peak A Velocity: 75.8 cm/s
MV Peak E Velocity: 73.5 cm/s
Mitral Annular E/Ea Vel Ratio: 9.67
Mitral Annular Ea Velocity: 7.6 cm/s
Peak Gradient - TR: 24.7 mmHg
Peak Velocity - TR: 248.7 cm/s
Pulmonary Vascular Resistance Estimate: 6.6 mmHg
RA Pressure Estimate: 8 mmHg
RR Interval: 652.17 ms
RV Peak Systolic Pressure: 32.7 mmHg
Weight (lbs): 162 [lb_av]
Weight: 2592 oz

## 2017-04-17 LAB — CBC AND DIFFERENTIAL
Baso # K/uL: 0 10*3/uL (ref 0.0–0.1)
Basophil %: 0 %
Eos # K/uL: 0 10*3/uL (ref 0.0–0.4)
Eosinophil %: 0 %
Hematocrit: 21 % — ABNORMAL LOW (ref 34–45)
Hemoglobin: 6.8 g/dL — ABNORMAL LOW (ref 11.2–15.7)
Lymph # K/uL: 0.2 10*3/uL — ABNORMAL LOW (ref 1.2–3.7)
Lymphocyte %: 1.7 %
MCH: 30 pg/cell (ref 26–32)
MCHC: 32 g/dL (ref 32–36)
MCV: 91 fL (ref 79–95)
Mono # K/uL: 0.1 10*3/uL — ABNORMAL LOW (ref 0.2–0.9)
Monocyte %: 0.9 %
Neut # K/uL: 8.3 10*3/uL — ABNORMAL HIGH (ref 1.6–6.1)
Nucl RBC # K/uL: 0.1 10*3/uL — ABNORMAL HIGH (ref 0.0–0.0)
Nucl RBC %: 0.5 /100 WBC — ABNORMAL HIGH (ref 0.0–0.2)
Platelets: 309 10*3/uL (ref 160–370)
RBC: 2.3 MIL/uL — ABNORMAL LOW (ref 3.9–5.2)
RDW: 17.3 % — ABNORMAL HIGH (ref 11.7–14.4)
Seg Neut %: 38.8 %
WBC: 9.9 10*3/uL (ref 4.0–10.0)

## 2017-04-17 LAB — BASIC METABOLIC PANEL
Anion Gap: 16 (ref 7–16)
CO2: 21 mmol/L (ref 20–28)
Calcium: 7.9 mg/dL — ABNORMAL LOW (ref 8.6–10.2)
Chloride: 96 mmol/L (ref 96–108)
Creatinine: 0.96 mg/dL — ABNORMAL HIGH (ref 0.51–0.95)
GFR,Black: 69 *
GFR,Caucasian: 60 *
Glucose: 179 mg/dL — ABNORMAL HIGH (ref 60–99)
Lab: 22 mg/dL — ABNORMAL HIGH (ref 6–20)
Potassium: 3.7 mmol/L (ref 3.3–5.1)
Sodium: 133 mmol/L (ref 133–145)

## 2017-04-17 LAB — COMPREHENSIVE METABOLIC PANEL
ALT: 198 U/L — ABNORMAL HIGH (ref 0–35)
AST: 180 U/L — ABNORMAL HIGH (ref 0–35)
Albumin: 1.9 g/dL — ABNORMAL LOW (ref 3.5–5.2)
Alk Phos: 488 U/L — ABNORMAL HIGH (ref 35–105)
Anion Gap: 15 (ref 7–16)
Bilirubin,Total: 1.3 mg/dL — ABNORMAL HIGH (ref 0.0–1.2)
CO2: 21 mmol/L (ref 20–28)
Calcium: 7.9 mg/dL — ABNORMAL LOW (ref 8.6–10.2)
Chloride: 97 mmol/L (ref 96–108)
Creatinine: 0.95 mg/dL (ref 0.51–0.95)
GFR,Black: 70 *
GFR,Caucasian: 61 *
Glucose: 74 mg/dL (ref 60–99)
Lab: 20 mg/dL (ref 6–20)
Potassium: 3.8 mmol/L (ref 3.3–5.1)
Sodium: 133 mmol/L (ref 133–145)
Total Protein: 4.1 g/dL — ABNORMAL LOW (ref 6.3–7.7)

## 2017-04-17 LAB — POCT GLUCOSE
Glucose POCT: 138 mg/dL — ABNORMAL HIGH (ref 60–99)
Glucose POCT: 182 mg/dL — ABNORMAL HIGH (ref 60–99)
Glucose POCT: 210 mg/dL — ABNORMAL HIGH (ref 60–99)
Glucose POCT: 221 mg/dL — ABNORMAL HIGH (ref 60–99)

## 2017-04-17 LAB — DIFF MANUAL
Bands %: 45 % (ref 0–10)
Diff Based On: 116 CELLS
Metamyelocyte %: 12 % — ABNORMAL HIGH (ref 0–1)
Myelocyte %: 2 % — ABNORMAL HIGH (ref 0–0)

## 2017-04-17 LAB — MAGNESIUM: Magnesium: 1.7 mg/dL (ref 1.6–2.5)

## 2017-04-17 LAB — APTT: aPTT: 34.1 s (ref 25.8–37.9)

## 2017-04-17 LAB — PHOSPHORUS
Phosphorus: 1.2 mg/dL — ABNORMAL LOW (ref 2.7–4.5)
Phosphorus: 2.7 mg/dL (ref 2.7–4.5)

## 2017-04-17 LAB — RBC MORPHOLOGY

## 2017-04-17 LAB — VANCOMYCIN, RANDOM: Vancomycin Level: 10.4 ug/mL

## 2017-04-17 LAB — PROTIME-INR
INR: 1.9 — ABNORMAL HIGH (ref 0.9–1.1)
Protime: 21 s — ABNORMAL HIGH (ref 10.0–12.9)

## 2017-04-17 MED ORDER — PLASMA-LYTE IV BOLUS *WRAPPED*
1000.0000 mL | Freq: Once | INTRAVENOUS | Status: AC
Start: 2017-04-17 — End: 2017-04-17
  Administered 2017-04-17: 1000 mL via INTRAVENOUS

## 2017-04-17 MED ORDER — PLASMA-LYTE IV SOLN *WRAPPED*
100.0000 mL/h | Status: DC
Start: 2017-04-17 — End: 2017-04-18
  Administered 2017-04-17 – 2017-04-18 (×24): 100 mL/h via INTRAVENOUS

## 2017-04-17 MED ORDER — FAT EMULSION SOYBEAN OIL 20 % IV EMUL *WRAPPED*
60.0000 g | Freq: Every day | INTRAVENOUS | Status: AC
Start: 2017-04-17 — End: 2017-04-18
  Administered 2017-04-17 – 2017-04-18 (×13): 60 g via INTRAVENOUS
  Filled 2017-04-17: qty 300

## 2017-04-17 MED ORDER — VANCOMYCIN HCL IN D5W 1500 MG/250 ML IV SOLN *I*
1500.0000 mg | INTRAVENOUS | Status: DC
Start: 2017-04-17 — End: 2017-04-19
  Administered 2017-04-17 – 2017-04-18 (×2): 1500 mg via INTRAVENOUS
  Filled 2017-04-17 (×3): qty 250

## 2017-04-17 MED ORDER — SODIUM CHLORIDE 0.9 % INJ (FLUSH) WRAPPED *I*
10.0000 mL | Status: AC | PRN
Start: 2017-04-17 — End: 2017-04-17
  Administered 2017-04-17: 10 mL via INTRAVENOUS

## 2017-04-17 MED ORDER — SODIUM CHLORIDE 0.9 % IV SOLN WRAPPED *I*
15.0000 mmol | Freq: Once | INTRAVENOUS | Status: AC
Start: 2017-04-17 — End: 2017-04-17
  Administered 2017-04-17 (×2): 15 mmol via INTRAVENOUS
  Filled 2017-04-17: qty 5

## 2017-04-17 MED ORDER — THIAMINE HCL 100 MG/ML IJ SOLN *I*
INTRAVENOUS | Status: AC
Start: 2017-04-17 — End: 2017-04-18
  Filled 2017-04-17: qty 636

## 2017-04-17 MED ORDER — PLASMA-LYTE IV BOLUS *WRAPPED*
500.0000 mL | Freq: Once | INTRAVENOUS | Status: AC
Start: 2017-04-17 — End: 2017-04-17
  Administered 2017-04-17: 500 mL via INTRAVENOUS

## 2017-04-17 MED ORDER — CALCIUM GLUCONATE 9.4 MEQ (2,000 MG) IN NS 110 ML *I*
9.4000 meq | INTRAVENOUS | Status: AC
Start: 2017-04-17 — End: 2017-04-17
  Administered 2017-04-17: 9.4 meq via INTRAVENOUS
  Filled 2017-04-17: qty 100

## 2017-04-17 MED ORDER — METHADONE HCL 10 MG/ML IJ SOLN *I*
7.5000 mg | Freq: Every day | INTRAMUSCULAR | Status: DC
Start: 2017-04-17 — End: 2017-04-19
  Administered 2017-04-17 – 2017-04-19 (×3): 7.5 mg via INTRAVENOUS
  Filled 2017-04-17 (×4): qty 0.75

## 2017-04-17 MED ORDER — NOREPINEPHRINE 8 MG IN NS 250 ML INFUSION *WRAPPED*
1.0000 ug/min | INTRAVENOUS | Status: DC
Start: 2017-04-17 — End: 2017-04-22
  Administered 2017-04-17: 12 ug/min via INTRAVENOUS
  Administered 2017-04-17: 10 ug/min via INTRAVENOUS
  Administered 2017-04-17: 14 ug/min via INTRAVENOUS
  Administered 2017-04-17: 16 ug/min via INTRAVENOUS
  Administered 2017-04-17 (×2): 12 ug/min via INTRAVENOUS
  Administered 2017-04-17: 10 ug/min via INTRAVENOUS
  Administered 2017-04-17: 24 ug/min via INTRAVENOUS
  Administered 2017-04-17: 12 ug/min via INTRAVENOUS
  Administered 2017-04-17: 24 ug/min via INTRAVENOUS
  Administered 2017-04-17: 14 ug/min via INTRAVENOUS
  Administered 2017-04-17: 24 ug/min via INTRAVENOUS
  Administered 2017-04-17: 20 ug/min via INTRAVENOUS
  Administered 2017-04-17: 18 ug/min via INTRAVENOUS
  Administered 2017-04-17 (×2): 10 ug/min via INTRAVENOUS
  Administered 2017-04-17: 14 ug/min via INTRAVENOUS
  Administered 2017-04-17: 12 ug/min via INTRAVENOUS
  Administered 2017-04-17: 22 ug/min via INTRAVENOUS
  Administered 2017-04-17: 12 ug/min via INTRAVENOUS
  Administered 2017-04-17: 8 ug/min via INTRAVENOUS
  Administered 2017-04-17: 12 ug/min via INTRAVENOUS
  Administered 2017-04-17: 6 ug/min via INTRAVENOUS
  Administered 2017-04-17: 24 ug/min via INTRAVENOUS
  Administered 2017-04-17: 2 ug/min via INTRAVENOUS
  Administered 2017-04-17: 24 ug/min via INTRAVENOUS
  Administered 2017-04-17: 8 ug/min via INTRAVENOUS
  Administered 2017-04-17: 24 ug/min via INTRAVENOUS
  Administered 2017-04-17: 10 ug/min via INTRAVENOUS
  Administered 2017-04-17: 16 ug/min via INTRAVENOUS
  Administered 2017-04-17 (×2): 24 ug/min via INTRAVENOUS
  Administered 2017-04-17: 18 ug/min via INTRAVENOUS
  Administered 2017-04-17: 14 ug/min via INTRAVENOUS
  Administered 2017-04-17 (×2): 22 ug/min via INTRAVENOUS
  Administered 2017-04-17: 10 ug/min via INTRAVENOUS
  Administered 2017-04-17: 16 ug/min via INTRAVENOUS
  Administered 2017-04-17: 20 ug/min via INTRAVENOUS
  Administered 2017-04-17: 12 ug/min via INTRAVENOUS
  Administered 2017-04-17: 4 ug/min via INTRAVENOUS
  Administered 2017-04-17: 14 ug/min via INTRAVENOUS
  Administered 2017-04-17: 12 ug/min via INTRAVENOUS
  Administered 2017-04-17: 10 ug/min via INTRAVENOUS
  Administered 2017-04-17 (×2): 12 ug/min via INTRAVENOUS
  Administered 2017-04-17: 18 ug/min via INTRAVENOUS
  Administered 2017-04-17: 12 ug/min via INTRAVENOUS
  Administered 2017-04-17: 6 ug/min via INTRAVENOUS
  Administered 2017-04-17: 10 ug/min via INTRAVENOUS
  Administered 2017-04-17: 16 ug/min via INTRAVENOUS
  Administered 2017-04-18 (×3): 4 ug/min via INTRAVENOUS
  Administered 2017-04-18: 8 ug/min via INTRAVENOUS
  Administered 2017-04-18: 6 ug/min via INTRAVENOUS
  Administered 2017-04-18 (×2): 2 ug/min via INTRAVENOUS
  Administered 2017-04-18: 10 ug/min via INTRAVENOUS
  Administered 2017-04-18 (×2): 4 ug/min via INTRAVENOUS
  Administered 2017-04-18: 10 ug/min via INTRAVENOUS
  Administered 2017-04-18 (×2): 2 ug/min via INTRAVENOUS
  Filled 2017-04-17 (×3): qty 250

## 2017-04-17 MED ORDER — SULFUR HEXAFLUORIDE MICROSPH 60.7-25 MG (LUMASON) IV SUSR *I*
2.0000 mL | INTRAMUSCULAR | Status: AC | PRN
Start: 2017-04-17 — End: 2017-04-17
  Administered 2017-04-17: 2 mL via INTRAVENOUS

## 2017-04-17 MED ORDER — METHADONE HCL 10 MG/ML IJ SOLN *I*
5.0000 mg | Freq: Two times a day (BID) | INTRAMUSCULAR | Status: DC
Start: 2017-04-17 — End: 2017-04-19
  Administered 2017-04-17 – 2017-04-19 (×4): 5 mg via INTRAVENOUS
  Filled 2017-04-17 (×9): qty 0.5

## 2017-04-17 MED ORDER — CALCIUM GLUCONATE 9.4 MEQ (2,000 MG) IN NS 110 ML *I*
9.4000 meq | Freq: Once | INTRAVENOUS | Status: AC
Start: 2017-04-17 — End: 2017-04-17
  Administered 2017-04-17: 9.4 meq via INTRAVENOUS
  Filled 2017-04-17: qty 100

## 2017-04-17 MED ORDER — MIDAZOLAM HCL 1 MG/ML IJ SOLN *I* WRAPPED
1.0000 mg | Freq: Once | INTRAMUSCULAR | Status: AC
Start: 2017-04-17 — End: 2017-04-17
  Administered 2017-04-17: 1 mg via INTRAVENOUS

## 2017-04-17 NOTE — Progress Notes (Addendum)
Critical Care Medicine Attending Note:      If there are key historical elements or objective findings that I think deserve particular emphasis, I have recorded them below. A summary of key elements of our assessment and plans is listed in the resident / medical student / PA / NP's note. Please refer to this note for complete details of our mutually agreed upon findings, assessment, and recommendations.  The problem list reflects my input.    Summary of history of present illness:  The patient is a 72 year old woman with a medical history significant for hypertension, pulmonary embolism (diagnosed in November 2018 (on Lovenox), fibromyalgia, gastroesophageal reflux disease, hypothyroidism, and anxiety/depression. 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 She had a cholecystectomy that failed to relieve her symptoms. She has completed neoadjuvant chemotherapy and radiation therapy. She has required methadone for chronic abdominal pain. On 1/25,she underwent a Whipple procedure. The surgical procedure and anesthetic were uncomplicated. She was admitted to the SICU service for ongoing management and the expectation of complicated pain management.  She did well and was discharged to the floor on 1/28.   She developed nausea and vomiting on 1/31 prompting NG tube placement.  She has since had high gastric output requiring TPN for nutrition.  On 2/12 (today), she developed tachypnea and confusion.  She had a worsening transaminitis.  CT of the abdomen/pelvis revealed a hematoma compressing the portal vein and common bile duct.  She was taken to IR where she underwent placement of a percutaneous biliary drain.  She had to be intubated for the procedure due to her marginal respiratory status.  She was admitted to the SICU service for close observation and management of sepsis.    Interval  history:  She was very septic overnight requiring a large IV fluid volume resuscitation and relatively high dose norepinephrine infusion.    Objective Section:  BP: (79-100)/(39-62)   Temp:  [36.4 C (97.5 F)-37.4 C (99.3 F)]   Temp src: Temporal (02/13 1200)  Heart Rate:  [92-123]   Resp:  [19-35]   SpO2:  [88 %-100 %]   Height:  [170.2 cm ('5\' 7"' )]   Weight:  [73.5 kg (162 lb)]     FiO2: 40 % (04/17/17 1600)        Intake/Output last 3 shifts:  I/O last 3 completed shifts:  02/12 1500 - 02/13 1459  In: 12051.7 (164 mL/kg) [I.V.:3196.8 (1.8 mL/kg/hr); Blood:597.5; Other:5; NG/GT:420; IV Piggyback:5544.5]  Out: 3140 (42.7 mL/kg) [Urine:2780 (1.6 mL/kg/hr); Emesis/NG output:360]  Net: 8911.7  Weight: 73.5 kg     Intake/Output this shift:  I/O this shift:  02/13 1500 - 02/13 2259  In: 235 (3.2 mL/kg) [I.V.:130; NG/GT:30]  Out: 75 (1 mL/kg) [Urine:75]  Net: 160  Weight: 73.5 kg       Vent settings for last 24 hours:  Ventilator: Drager  MODE: CMV+  Ventilator On: Yes  FiO2: 40 %  AutoFlow: On  S RR: 14  Vt: 450 mL  PEEP: 12 cm H20  IT: 0.85 sec  Rise Time/Slope: 0.2  Sensitivity: 2  A RR: 19  PIP: 29 cm H2O  Plateau Pres: 27  MAP: 16 cmH2O  MV: 8.77 l/min  A VT: 454 mL  Vt exp: 463 mL    Physical exam:      General: The patient is on the ventilator and is  in no apparent distress.  The patient is awake, alert, interactive, and appropriate.     Pulmonary: Symmetric breath sounds, essentially clear bilaterally.     Cardiac: Regular rate and rhythm.     Abdominal: Absent bowel sounds, moderately tense, and moderately distended.  The midline incision is well healed.      Extremities: Warm, pink, 1-2+ edema.     Labs:  Lab Results   Component Value Date    WBC 9.9 04/17/2017    HCT 21 (L) 04/17/2017    PLT 309 04/17/2017     Lab Results   Component Value Date    NA 133 04/17/2017    K 3.7 04/17/2017    CL 96 04/17/2017    CO2 21 04/17/2017    UN 22 (H) 04/17/2017    CREAT 0.96 (H) 04/17/2017    WBGLU 184 (H) 04/17/2017     PGLU 182 (H) 04/17/2017     Lab Results   Component Value Date    CA 7.9 (L) 04/17/2017    MG 1.7 04/17/2017    PO4 1.2 (L) 04/17/2017     Lab Results   Component Value Date    PTI 21.0 (H) 04/17/2017    INR 1.9 (H) 04/17/2017    PTT 34.1 04/17/2017     Lab Results   Component Value Date    ALT 198 (H) 04/17/2017    AST 180 (H) 04/17/2017    ALK 488 (H) 04/17/2017     Bilirubin,Total   Date Value Ref Range Status   04/17/2017 1.3 (H) 0.0 - 1.2 mg/dL Final           Lab results: 04/17/17  1151  04/16/17  1718   Lactate ART,WB 3.6*  < >  --    Lactate VEN,WB  --   --  4.6*   < > = values in this interval not displayed.           Currently Active/Followed Hospital Problems:  Active Hospital Problems    Diagnosis    *!*Pancreatic adenocarcinoma s/p Whipple 03/29/17     -S/p open Whipple on 1/25 with Dr. Ron Agee  -Developed gastric outlet obstruction 2/2 hematoma causing severe compression of the main portal vein and common bile duct.  -s/p external/internal PTC drain 2/12 in IR  -Started on Zosyn 2/12  -Continue Decadron for PONV  -Continue TPN with SSI for glucose control        Septic shock     -Occurring in setting of biliary obstruction s/p PTC external/internal placement for source control  -Requiring fluid boluses and levophed  -Blood cultures with NGTD, on Zosyn (2/12-) and Vanco (2/12-)  -Was on bicarb gtt, will d/c as currently alkalotic  -Urine output adequate          Acute respiratory failure     -Intubated 2/12 for PTC placement  -Increased FiO2 and PEEP requirements o/n without significant findings on CXR  -Does have h/o PE with anticoag on hold, echo pending       hx of Pulmonary embolism     - PE 01/18/17 on CT chest   -Had been on therapeutic Lovenox but transitioned to Heparin gtt prior to [facility]  -Awaiting surgery input as to when to restart      Acute blood loss anemia     -s/p 1 unit PRBC o/n for Hg=6.8        Cancer associated pain     - Methadone at home  -Pain management  being guided by patient's  primary pain physician Dr Irven [place]  -Continue methadone at 5ng IV BID (09:00 & 21:00) and 7.5 mg IV qDay @ 13:00  -Fenatnyl gtt while intubated and then would transition back to dilaudid PCA        Depression     -Continue Elavil and Lexapro      Hypothyroidism     -Synthroid            Attending summary impressions:     The patient has been very critically ill with severe biliary sepsis due to obstruction of the afferent limb of the reux anastomosis for a Whipple procedure.  She had sepsis prompting IR placement of a percutaneous biliary drain that extended into the duodenum.   Her sepsis progressed to septic shock over night with a marked elevated serum lactate level (6.2) and high dose norepinephrine requirement.  She has been improving since late last night and early this morning but still has severe sepsis.   The patient has a has had a very significant systemic inflammatory response syndrome (SIRS).  She had a large volume IV fluid/product resuscitation requirement and has a high dose inotrope/pressor requirement.  She has been improving with a very reduced norepinephrine requirement and much decreased serum lactate level.  Her clinical exam and decreasing lactate level are consistent with good perfusion.   She has acute respiratory failure.   She has had high oxygen and PEEP requirements.  She has been improving with decreasing oxygen requirements.  We will start to wean her PEEP.   Her septic shock has improved to severe sepsis.  She continues to have a significant left shift on her WBC count but does not have a significant leukocytosis.   She is being treated with vancomycin and Zosyn.   She has a significant elevation of her transaminases likely due to the biliary distention.  There is also likely a component of shock liver.   The patient had a significant anemia and has been transfused with packed red blood cells.  Her hematocrit is now stable.   The patient has good to excellent urine output  and normal renal function tests consistent with excellent renal function.   She has been on therapeutic anticoagulation for pulmonary embolism history.  Anticoagulation is on hold for now.   We are resuming her standing methadone dose for her chronic pain.  She is also on a fentanyl infusion.   She remains NPO.  She is getting TPN.   Discussed with her family and Dr. Dennard Schaumann.    Attending Attestation    This is a high complexity patient.     '[X]'  This patient is critically ill with at least 1 organ system failure including septic shock, acute respiratory failure, SIRS, anemia, and shock liver. The care I have delivered involved high complexity decision making to assess, manipulate, and support vital system function(s), to treat the vital organ system failure and/or prevent further deterioration of the patient's condition. All nursing documentation, laboratory data, test results, and radiographs were reviewed and interpreted by me. I have established the management plan for this patient's critical illness and have been immediately available to assist with patient care. My documented total critical care time reflects my own patient care time and does not include teaching or procedure time.     Critical care time: Personal time spent - (exclusive of performance of any procedures performed in the last 24 hours):  40 minutes    Signed by: Lorenza Chick,  MD as of 04/17/2017 at 5:46 PM.

## 2017-04-17 NOTE — Progress Notes (Signed)
Report Given To  Laqueta Carina, RN's      Descriptive Sentence / Reason for Admission   PMHx: pancreatic adenocarcinoma    1/25 s/p open Whipple. She is being admitted to SICU for post-operative monitoring, notably hemodynamic status and post-op pain control.      Active Issues / Relevant Events   2/8 upper GI series done  2/11D: fatigue, refusing to get cleaned up, no GI today, KUB of abdomen, heparin gtt started for Hx of PE. Nights: A&O x3 in beginning of the shift, Pain issues overnight & decreased mentation, [place] PA notified.   2/12: Continued confusion/pain on CC5, blood/urine culture sent. CT abd done-showed hematoma near common bile duct. IR for biliary drain. Intubated for inc RR. Transferred to [address]3600. 2L IVF for HoTn. Propofol D/C'd.  2/13N: 5.5 L total for hypotension. A-line and CVC placed. 4 runs of ca+ and 3 runs k+ replaced. Vent settings increased to 80% and PEEP 12 for PaO2 of 57 (repeat 85). Crit 18- 1 unit PRBC given, repeat draw 21. Lactate trending down. PO meds and methadone held per NP instruction. Levo started for continued hypotension.        To Do List  Goal: SBP > 90  Q 6 hr ABG with panel  Flush NG q 3 60cc  Restart Heparin gtt 2/13?  *Order to not flush biliary drain        Anticipatory Guidance / Discharge Planning  HAC: ETT, NGT, mediport, PIV x3, biliary drain, foley, left IJ TLIC, & left brachial a-line    Daughter updated at bedside 2/13 on night shift by SICU RN and NP

## 2017-04-17 NOTE — Consults (Addendum)
Nutrition Brief Note - 72 y.o. female pancreatic adenocarcinoma s/p uncomplicated Whipple 5/88/50, n/v beginning 1/31 and NGT placed with continued high output and concern for gastric outlet obstruction distal to the gastro-jejunostomy site, CT ABD/pelvis demonstrated a hematoma causing severe compression of the main portal vein and common bile duct.  Decision was made to place an IR percutaneous bilary drain on 04/16/17.  Prior to placement patient had increased WOB and was intubated for the procedure.  Pt transferred to SICU service on 04/16/17 for severe sepsis.  Reviewed TPN order, labs, meds and I/O. Noted 500 mL NG output yesterday (2/12); continues to be NPO. Noted FSBGs - received 5 units sliding scale insulin yesterday (2/12).  Noted low normal K+ and hypocalcemia; received IV repletion.  Noted hypophosphatemia; no repletion given at this time.    Plan/reccomendations:  1. Continue current TPN order except change to 2/3 Cl-/1/3 acetate.  2. Replete PO4 outside TPN.  3. Will F/U per high nutrition risk protocol.    Einar Gip, RD, CNSC [phone]

## 2017-04-17 NOTE — Progress Notes (Signed)
Report Given To  Tobin Chad, RN      Descriptive Sentence / Reason for Admission   PMHx: pancreatic adenocarcinoma    1/25 s/p open Whipple. She is being admitted to SICU for post-operative monitoring, notably hemodynamic status and post-op pain control.      Active Issues / Relevant Events   2/8 upper GI series done  2/11D: fatigue, refusing to get cleaned up, no GI today, KUB of abdomen, heparin gtt started for Hx of PE. Nights: A&O x3 in beginning of the shift, Pain issues overnight & decreased mentation, [place] PA notified.   2/12: Continued confusion/pain on CC5, blood/urine culture sent. CT abd done-showed hematoma near common bile duct. IR for biliary drain. Intubated for inc RR. Transferred to 83600. 2L IVF for HoTn. Propofol D/C'd.  2/13N: 5.5 L total for hypotension. A-line and CVC placed. 4 runs of ca+ and 3 runs k+ replaced. Vent settings increased to 80% and PEEP 12 for PaO2 of 57 (repeat 85). Crit 18- 1 unit PRBC given, repeat draw 21. Lactate trending down. PO meds and methadone held per NP instruction. Levo started for continued hypotension.  2/13D: ECHO (bilateral pleural effusions), d/c bicarb gtt, started plasmalyte @ 100, FiO2 weaned to 40%, 1 run of ca+ and potassium phos, fentanyl gtt increased to 150, IV methadone restarted, Levo weaned to 12        To Do List  Goal: SBP > 90  Q 6 hr ABG with panel  Flush NG q 3 60cc  Restart Heparin gtt?  Flush biliary drain BID w/ 5cc NS      Please remember to complete care plan and education record at 0400 and 1600 hours      Anticipatory Guidance / Discharge Planning  HAC: ETT, NGT, mediport, PIV x3, biliary drain, foley, left IJ TLIC, & left brachial a-line    Daughter updated at bedside 2/13

## 2017-04-17 NOTE — Progress Notes (Addendum)
Surgical Oncology/Hepatobiliary Surgery Progress Note     LOS: 19 days     Subjective:    S/p PTC on 2/12.  Has received 4.5L boluses since IR yesterday.  Received 1 U FFP at IR and 1 U PRBC overnight for Hct of 18.  Poor oxygenation overnight but now improved.  On Norepi at 24.  UOP adequate      Objective:    Vitals Sign Ranges for Past 24 Hours:  BP: (79-161)/(39-80)   Temp:  [36.6 C (97.9 F)-37.8 C (100 F)]   Temp src: Temporal (02/13 0400)  Heart Rate:  [98-132]   Resp:  [16-45]   SpO2:  [78 %-100 %]   Height:  [170.2 cm (_0 )]   Weight:  [73.5 kg (162 lb)]       Physical Exam:    General Appearance: groaning in pain, does not appear to be oriented  HEENT: NGT with bilious/brown output  Cardiac: mild tachycardia  Respiratory: vented  Abdomen: soft, moderately distended but not rigid, midline incision c/d/i, pre-existing abdominal wall ecchyosis; biliary drain with yellow bilious output  Extremities: warm    Labs:    CBC:    Recent Labs  Lab 04/17/17  0553  04/17/17  0044 04/16/17  2201  04/16/17  1718 04/16/17  1119 04/16/17  0100 04/15/17  1552 04/15/17  0412 04/15/17  0025 04/14/17  0005   WBC  --   --  9.9  --   --  2.9* 7.4 9.9  --   --  7.1 11.7*   Hemoglobin 7.7*  < > 6.8*   7.3* 5.8*  < > 7.2*   7.7* 7.5* 6.7*  --  6.8* 6.6* 7.2*   Hematocrit  --   --  21* 18*  --  23* 24* 21*  --  22* 21* 23*   Platelets  --   --  309  --   --  413* 501* 413* 374*  --  379* 377*   < > = values in this interval not displayed.    Metabolic Panel:    Recent Labs  Lab 04/17/17  0044 04/16/17  1718 04/16/17  0831 04/16/17  0100 04/15/17  0025 04/14/17  0005 04/13/17  0000 04/12/17  0021   Sodium 133 131* 131* 131* 132* 135 135 136   Potassium 3.8 3.6 3.9 4.0 3.7 4.2 4.3 4.1   Chloride 97 96 95* 95* 97 96 98 99   CO2 21 19* _1 UN _2 21* 23* 22* 24* 20   Creatinine 0.95 0.73 0.74 0.74 0.70 0.72 0.71 0.73   Glucose 74 184* 144* 146* 132* 118* 133* 124*   Calcium 7.9* 7.5* 8.2* 8.1* 8.1* 8.4*  8.2* 8.3*   Magnesium 1.7 1.6  --  1.9 1.9 1.9 2.0 1.8   Phosphorus 1.2*  --   --  2.9 3.7 3.2 3.5 3.4          Assessment:    72 y.o. female with h/o recent PE and pancreatic cancer POD # 19  status post whipple procedure complicated by delayed gastric emptying.  NGT replaced on 04/04/17. UGI on 04/12/17 concerning for obstruction just beyond the [place] in the efferent limb.  Course complicated on 6/94/85 by rising LFTs and sepsis with CT concerning for biliary tree obstruction and PV compression due to hematoma and afferent limb distention in setting of PE treatment. S/p PTC 04/16/17. She had a planned intubation  on 04/16/17 prior to IR procedure due to respiratory insufficiency with tachypnea but O2 Sats table on 2L NC, but post-IR did have hypoxia.    Plan:    - On vanco, zosyn - may need anti-fungal coverage if not improving  - NGT to LIWS  - PTC/Biliary drain to gravity  - NGT to LIWS  - Hold anticoagulation as this may have contributed to hematoma and current course. RLL segmental/subsegmental PE was noted on restating scans on 01/18/17 and she was started on therapeutic lovenox (pre-op) on 01/18/17. Tx lovenox was restarted on POD#5 and she had been transitioned to heparin gtt on 04/15/17 in preparation for EGD.   - Appreciate GI consult and discussion of EGD for previous imaging concerning for efferent limb obstruction - updated re course today; EGD on hold for now  - Supportive care per SICU team    Donata Duff, MD  04/17/2017     7:55 AM  General Surgery Resident      [facility] and GI Surgery Attending:    I saw and evaluated the patient. I have reviewed and edited the resident's/fellow's note and confirm the findings and plan of care as documented above. Has required pressors. Bili down.    Charolotte Eke, MD

## 2017-04-17 NOTE — Progress Notes (Signed)
CTSP for persistent hypotension despite multiple fluid bolues.     0000: Central line placed in [place] (see procedure note) for further central access and likely need for vasopressors.     0100: Notified by bedside RN that patient remains hypotensive while bolus running.  Levophed gtt started.  Lactate downtrending after multiple fluid boluses (4.3 from 6.2).      Overnight, with worsening hypoxia on ABGs. PEEP increased from 8 to 10 to 12. With vent adjustments, PaO2 improved from 50's to 70s. Will continue to monitor.    [facility] resident Cecilio Asper) updated on patient's clinical status and overnight procedures.     0400: To bedside to assess patient, remains very dependent on levophed (up to 24 mcg/min).  Opens eyes, squeezes hands, and nods yes to pain; Fentanyl gtt and boluses given for pain management.      Due to high pressor requirement, hx of PE, and need for intravascular fluid assessment, bedside echo ordered.     This patient is critically ill with at least 1 organ system failure associated with a high probability of imminent or life threatening deterioration. The care I have delivered involved high complexity decision making to assess, manipulate, and support vital system function(s), to treat the vital organ system failure and/or prevent further life threatening deterioration of the patient's condition.     Critical care time: I spent >120 minutes personally attending to this patient's critical care needs. This critical care time is exclusive of any time for separately billable procedures        Collene Schlichter, NP

## 2017-04-17 NOTE — Procedures (Addendum)
Procedure Report  CENTRAL LINE INSERTION NOTE     Time Out:  Side:  left     Participants involved in timeout: Yes, Tara (RN) and Margreta Journey (NP)  Consent obtained: Yes  Correct patient (use 2 identifiers): Yes  Vascular procedure/s in past ? No  Chest tube in place? No  Is the primary team aware of procedure ? Yes  Correct procedure: Yes  Correct site: Yes  Correct patient position: Yes    Indications:  Administration of Medication and Blood Draws    Procedure Details:      1% lidocaine:   None  Vein entered with: 18G needle  Technique:    ultrasound and with wire guide  Confirmed by:  manometry  Ectopy:   no  Successful:  Yes  Venous site:   internal jugular  Attempted sites:  Left internal jugular  Secured by:   suturing and bioocclusive dressing    Hardware:    Size:  78F  Length(cm): 20  Lumen:  Triple  Coating:  none  Kit Lot #:  [id number]  Depth:   18 cm      Complications:  none    Condition:  Pt tolerated procedure well. Yes  Post procedure condition: no change from prior    Plan/Orders:  Chest X-Ray: reviewed    EBL:  5 mL    Disposition:   No change from prior    Shelly Flatten available and present throughout procedure.     Collene Schlichter, NP

## 2017-04-17 NOTE — Plan of Care (Signed)
Nutrition     Patient's nutritional status is maintained or improved Maintaining        Safety     Patient will remain free of falls Maintaining          Impaired Gas Exchange     Patient will maintain adequate oxygenation Progressing towards goal    Continuing to wean vent setting     Pain/Comfort     Patient's pain or discomfort is manageable Progressing towards goal    Restarting IV methadone

## 2017-04-17 NOTE — Plan of Care (Signed)
Impaired Ambulation    • STG - IMPROVE AMBULATION Plan altered due to change in pt condition        Impaired Bed Mobility    • STG - IMPROVE BED MOBILITY Plan altered due to change in pt condition        Impaired Stair Navigation    • STG - IMPROVE STAIR NAVIGATION Plan altered due to change in pt condition        Impaired Transfers    • STG - IMPROVE TRANSFERS Plan altered due to change in pt condition

## 2017-04-17 NOTE — Progress Notes (Signed)
Physical Therapy Treatment Note:       04/17/17 0700   PT Tracking   PT TRACKING PT Discontinue   Visit Number   Visit Number [facility]) / Treatment Day (HH) 0   Additional Comments   Additional comments Pt transferred to ICU level care. PT to be discontinued at this time. Please re-refer when medically appropriate.     Rodena Piety, PT, DPT  Pager [phone]

## 2017-04-17 NOTE — Progress Notes (Signed)
SICU Progress Note    Amanda Galloway is a 72 y.o. female with PMHx PE, pancreatic adenocarcinoma s/p uncomplicated Whipple 0/09/38 with Dr Amanda Galloway.  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  Discharged to the floor 1/28.  Developed n/v beginning 1/31 and NGT placed with continued high output and concern for gastric outlet obstruction distal to the gastro-jejunostomy site.  On 2/12 developed tachypnea, mental status changes, and worsening transaminitis.  CT ABD/pelvis demonstrated a hematoma causing severe compression of the main portal vein and common bile duct.  Decision was made to place an IR percutaneous bilary drain.  Prior to placement patient had increased WOB and was intubated for the procedure.  Transferred to SICU post-procedure for further management.      Interval History: Required 5.5 L PL and addition of levophed for hypotension (SBPs to 70s), also with increased requirements for PEEP and FiO2, given 1 unit PRBC, IJ and A line placed, placed on BiCarb gtt (now d/c'd)    Past Med/Sx History:   Past Medical History:   Diagnosis Date    Acute kidney failure 03/31/2017    Cancer     Depression     Fibromyalgia     Hypothyroidism          Physical Exam by Systems:  Gen: Opens eyes to voice, intubated, NAD  HEENT: AT/NC, MMM, Sclera anicteric  CV: Regular @ 120 bpm  Chest: CTA B/L, no wheezes, rales, or rhonchi  Abd: soft, NT, right PTC drain in place with brown/yellow output  Ext: +3-4 B LE edema  Neuro: grossly intact    Significant Lab Data:  Reviewed    Significant Radiology: Reviewed    Micro: Blood culture 2/12 NGTD    I/Os: 11540/2490, +9050      Assessment and Plan for Active/Followed Hospital Problems:   Active Hospital Problems    Diagnosis    *!*Pancreatic adenocarcinoma s/p Whipple 03/29/17     -S/p open Whipple on 1/25 with Dr. Ron Galloway  -Developed gastric outlet obstruction 2/2 hematoma causing severe compression of the main portal  vein and common bile duct.  -s/p external/internal PTC drain 2/12 in IR  -Started on Zosyn 2/12  -Continue Decadron for PONV  -Continue TPN with SSI for glucose control        Septic shock     -Occurring in setting of biliary obstruction s/p PTC external/internal placement for source control  -Requiring fluid boluses and levophed  -Blood cultures with NGTD, on Zosyn (2/12-) and Vanco (2/12-)  -Was on bicarb gtt, will d/c as currently alkalotic  -Urine output adequate          Acute respiratory failure     -Intubated 2/12 for PTC placement  -Increased FiO2 and PEEP requirements o/n without significant findings on CXR  -Does have h/o PE with anticoag on hold, echo pending       hx of Pulmonary embolism     - PE 01/18/17 on CT chest   -Had been on therapeutic Lovenox but transitioned to Heparin gtt prior to [facility]  -Awaiting surgery input as to when to restart      Acute blood loss anemia     -s/p 1 unit PRBC o/n for Hg=6.8        Cancer associated pain     - Methadone at home  -Pain management being guided by patient's primary pain physician Dr Amanda Galloway  -Continue methadone at 5ng IV BID (  09:00 & 21:00) and 7.5 mg IV qDay @ 13:00  -Fenatnyl gtt while intubated and then would transition back to dilaudid PCA        Depression     -Continue Elavil and Lexapro      Hypothyroidism     -Synthroid          Diagnoses for this hospitalization include:  Acidosis, Acute Respiratory Failure, Septic Shock and Shock  Septic       ---SICU Critical Care  Bundle---  Fluids & Electrolytes:  Sodium Bicarb gtt @ 125,   Overnight: 4 runs of ca+ and 3 runs of k+    Nutrition / last BM: NPO, TPN and lipids    Mobility plan: AAT    Neuro / sedation / pain / sedation holiday: Fentanyl @ 100     Respiratory weaning (and PST, TC) / pulmonary toilet:   FiO2 80%  PEEP 12  RR 14  Vt 450    Delirium / sleep / prevention / intervention: Neg for delirium      List of antimicrobials: Zosyn    Drains / Lines / Tubes: ETT, NG, mediport, PIV  x3, biliary drain, foley, TLIC, A-line     Indication for foley: Accurate I&Os    DVT prophylaxis / a PTT: SCDs, restart heparin gtt?    Indications for gastric acid suppression: Protonix     Labs ordered:   Daily: CBC w/ diff, Mg, Phos, APTT, Protime-INR  Q6: ABG  Tues/Thurs/Sat: Triglycerides  PRN: ABG, Hct, Hgb    Family concern / updates: at bedside 2/13     Patient likes to be called: Amanda Galloway    Code status: Full     Disposition / Level: 1      Author: Helene Shoe, PA  as of: 04/17/2017  at: 7:38 AM

## 2017-04-17 NOTE — Progress Notes (Signed)
Respiratory Therapy  Weekly Summary Note    Date: 04/17/2017                         Time: 9:14 AM    Initial Assessment    Subjective   Patient intubated and sedated    Objective   Patient Vitals for the past 12 hrs:   BP Temp Temp src Pulse Resp SpO2 Height Weight   04/17/17 0822 - - - - - 99 % - -   04/17/17 0800 91/54 36.4 C (97.5 F) TEMPORAL 96 19 98 % - -   04/17/17 0719 - - - - - 99 % 1.702 m (5\' 7" ) 73.5 kg (162 lb)   04/17/17 0700 91/54 - - 98 22 98 % - -   04/17/17 0600 97/55 - - 100 22 96 % - -   04/17/17 0515 - - - 104 - 97 % - -   04/17/17 0500 (!) 96/47 - - 102 22 97 % - -   04/17/17 0440 - - - 101 - 97 % - -   04/17/17 0435 - - - 100 - 97 % - -   04/17/17 0430 - - - 102 - 97 % - -   04/17/17 0424 - - - - - 97 % - -   04/17/17 0410 - - - 102 - 97 % - -   04/17/17 0400 (!) 82/55 37 C (98.6 F) TEMPORAL 106 20 97 % - -   04/17/17 0300 (!) 88/50 - - 109 (!) 25 96 % - -   04/17/17 0245 - - - 110 - 95 % - -   04/17/17 0235 (!) 85/52 - - - - - - -   04/17/17 0200 (!) 86/50 - - 109 (!) 26 94 % - -   04/17/17 0100 - - - - (!) 25 - - -   04/17/17 0040 (!) 79/48 - - (!) 112 - 96 % - -   04/17/17 0000 (!) 87/47 37.3 C (99.1 F) TEMPORAL (!) 111 22 96 % - -   04/16/17 2320 - - - (!) 112 - 94 % - -   04/16/17 2313 - - - - - 96 % - -   04/16/17 2310 - 37.3 C (99.1 F) TEMPORAL (!) 112 - 96 % - -   04/16/17 2300 (!) 86/46 - - (!) 114 (!) 34 97 % - -   04/16/17 2258 (!) 86/43 37.4 C (99.3 F) - (!) 113 (!) 33 - - -   04/16/17 2255 - - - (!) 112 - 96 % - -   04/16/17 2200 (!) 82/48 - - 109 (!) 33 96 % - -   04/16/17 2140 - - - - - 95 % - -   04/16/17 2115 (!) 82/50 - - (!) 112 - 100 % - -       Lung Sounds:   Respiratory Pattern: Assisted  Bilateral Breath Sounds: Diminished;Coarse  Sputum Color: Blood Tinged  Sputum Amount: Moderate  Sputum Consistency: Thick     RESPIRATORY / METABOLIC / BLOOD LEVELS:    Respiratory / Metabolic  pH, ABG: (!) 0.98  pCO2, ABG (mmHG): (!) 32 mmHG  pO2, ABG (mmHG): 85 mmHG  SaO2,  ABG (%): 96 %  pH, VBG: 7.38  pCO2, VBG (mmHG): (!) 35 mmHG  pO2, VBG (mmHG): 29 mmHG  SvO2, VBG (%): (!) 56 %  Lactate: (!) 4.1 mmHG  Blood Levels  Hemoglobin: (!) 7.7  Platelets: 309  Albumin: (!) 1.9                        Ventilator Settings:   Ventilator: Drager  MODE: CMV+  Ventilator On: Yes  FiO2: 70 %  AutoFlow: On  S RR: 14  Vt: 450 mL  PEEP: 12 cm H20  IT: 0.85 sec  Rise Time/Slope: 0.2  Sensitivity: 2  A RR: 20  PIP: 29 cm H2O  Plateau Pres: 27  MAP: 17 cmH2O  MV: 8.21 l/min  A VT: 450 mL  Vt exp: 448 mL      Other Relevant Findings:  None    Assessment  71 y.o.female with PMHx PE, andpancreatic cancer. Diminished/coarse BS auscultated bilaterally. ETT secured at 22 @ teeth.    Plan  Follow MD's directives    Diana Eves, RRT 9:14 AM 04/17/2017

## 2017-04-17 NOTE — Progress Notes (Signed)
---  Critical Care Bundle--    Fluids & Electrolytes:  Plasmalyte @ 100   1 run on Ca+ this morning     Nutrition / last BM: NPO, TPN and lipids    Mobility plan: AAT    Neuro / sedation / pain / sedation holiday: Fentanyl @ 150    Respiratory weaning (and PST, TC) / pulmonary toilet:   FiO2 40%  PEEP 12  RR 14  Vt 450    Delirium / sleep / prevention / intervention: Neg for delirium      List of antimicrobials: Zosyn, Vanco     Drains / Lines / Tubes: ETT, NG, mediport, PIV x3, biliary drain, foley, TLIC, A-line     Indication for foley: Accurate I&Os    DVT prophylaxis / a PTT: SCDs     Indications for gastric acid suppression: Protonix     Labs ordered:   Daily: CBC w/ diff, Mg, Phos, APTT, Protime-INR  Q12: ABG  Tues/Thurs/Sat: Triglycerides  PRN: ABG, Hct, Hgb    Family concern / updates: at bedside 2/13     Patient likes to be called: [name]    Code status: Full     Disposition / Level: ICU, Level 1     Nursing reiterate plan for day: Y/N  Wean Levo  Manage pain             Author: Jodelle Red, RN  as of: 04/17/2017  at: 7:16 AM

## 2017-04-18 ENCOUNTER — Inpatient Hospital Stay: Payer: Medicare (Managed Care)

## 2017-04-18 LAB — ART GASES / WHOLE BLOOD PANEL
Base Excess, Arterial: -1 mmol/L (ref <=2)
Base Excess, Arterial: 2 mmol/L (ref <=2)
Base Excess, Arterial: 1 mmol/L (ref <=2)
Base Excess, Arterial: -2 mmol/L (ref <=2)
CO2,ART (Calc): 23 mmol/L (ref 23–28)
CO2,ART (Calc): 27 mmol/L (ref 23–28)
CO2,ART (Calc): 25 mmol/L (ref 23–28)
CO2,ART (Calc): 24 mmol/L (ref 23–28)
CO: 0.2 %
CO: 0.7 %
CO: 0.9 %
CO: 1.1 %
FO2 Hb, Arterial: 94 % (ref 90–100)
FO2 Hb, Arterial: 95 % (ref 90–100)
FO2 Hb, Arterial: 92 % (ref 90–100)
FO2 Hb, Arterial: 97 % (ref 90–100)
Glucose,WB: 197 mg/dL — ABNORMAL HIGH (ref 60–99)
Glucose,WB: 202 mg/dL — ABNORMAL HIGH (ref 60–99)
Glucose,WB: 213 mg/dL — ABNORMAL HIGH (ref 60–99)
Glucose,WB: 192 mg/dL — ABNORMAL HIGH (ref 60–99)
HCO3, Arterial: 22 mmol/L (ref 21–26)
HCO3, Arterial: 26 mmol/L (ref 21–26)
HCO3, Arterial: 24 mmol/L (ref 21–26)
HCO3, Arterial: 23 mmol/L (ref 21–26)
Hemoglobin: 6.5 g/dL — ABNORMAL LOW (ref 11.2–15.7)
Hemoglobin: 6.2 g/dL — ABNORMAL LOW (ref 11.2–15.7)
Hemoglobin: 7.5 g/dL — ABNORMAL LOW (ref 11.2–15.7)
Hemoglobin: 8.3 g/dL — ABNORMAL LOW (ref 11.2–15.7)
ICA @7.4,WB: 4.4 mg/dL — ABNORMAL LOW (ref 4.8–5.2)
ICA @7.4,WB: 4.2 mg/dL — ABNORMAL LOW (ref 4.8–5.2)
ICA @7.4,WB: 4.5 mg/dL — ABNORMAL LOW (ref 4.8–5.2)
ICA @7.4,WB: 4.7 mg/dL — ABNORMAL LOW (ref 4.8–5.2)
ICA Uncorr,WB: 4.2 mg/dL
ICA Uncorr,WB: 4 mg/dL
ICA Uncorr,WB: 4.3 mg/dL
ICA Uncorr,WB: 4.6 mg/dL
Lactate ART,WB: 2.4 mmol/L — ABNORMAL HIGH (ref 0.3–0.8)
Lactate ART,WB: 1.9 mmol/L — ABNORMAL HIGH (ref 0.3–0.8)
Lactate ART,WB: 1.9 mmol/L — ABNORMAL HIGH (ref 0.3–0.8)
Lactate ART,WB: 1.8 mmol/L — ABNORMAL HIGH (ref 0.3–0.8)
Methemoglobin: 0.2 % (ref 0.0–1.0)
Methemoglobin: 0.2 % (ref 0.0–1.0)
Methemoglobin: 0 % (ref 0.0–1.0)
Methemoglobin: 0.1 % (ref 0.0–1.0)
NA, WB: 128 mmol/L — ABNORMAL LOW (ref 135–145)
NA, WB: 128 mmol/L — ABNORMAL LOW (ref 135–145)
NA, WB: 127 mmol/L — ABNORMAL LOW (ref 135–145)
NA, WB: 129 mmol/L — ABNORMAL LOW (ref 135–145)
Potassium,WB: 3.2 mmol/L — ABNORMAL LOW (ref 3.4–4.7)
Potassium,WB: 3.3 mmol/L — ABNORMAL LOW (ref 3.4–4.7)
Potassium,WB: 3.1 mmol/L — ABNORMAL LOW (ref 3.4–4.7)
Potassium,WB: 3.5 mmol/L (ref 3.4–4.7)
pCO2, Arterial: 31 mm Hg — ABNORMAL LOW (ref 35–46)
pCO2, Arterial: 32 mm Hg — ABNORMAL LOW (ref 35–46)
pCO2, Arterial: 30 mm Hg — ABNORMAL LOW (ref 35–46)
pCO2, Arterial: 36 mm Hg (ref 35–46)
pH: 7.47 — ABNORMAL HIGH (ref 7.35–7.45)
pH: 7.52 — ABNORMAL HIGH (ref 7.35–7.45)
pH: 7.51 — ABNORMAL HIGH (ref 7.35–7.45)
pH: 7.42 (ref 7.35–7.45)
pO2,Arterial: 72 mm Hg — ABNORMAL LOW (ref 80–100)
pO2,Arterial: 76 mm Hg — ABNORMAL LOW (ref 80–100)
pO2,Arterial: 64 mm Hg — ABNORMAL LOW (ref 80–100)
pO2,Arterial: 121 mm Hg — ABNORMAL HIGH (ref 80–100)

## 2017-04-18 LAB — PROTIME-INR
INR: 1.4 — ABNORMAL HIGH (ref 0.9–1.1)
Protime: 15.9 s — ABNORMAL HIGH (ref 10.0–12.9)

## 2017-04-18 LAB — POCT GLUCOSE
Glucose POCT: 200 mg/dL — ABNORMAL HIGH (ref 60–99)
Glucose POCT: 195 mg/dL — ABNORMAL HIGH (ref 60–99)
Glucose POCT: 191 mg/dL — ABNORMAL HIGH (ref 60–99)

## 2017-04-18 LAB — COMPREHENSIVE METABOLIC PANEL
ALT: 531 U/L — ABNORMAL HIGH (ref 0–35)
AST: 782 U/L — ABNORMAL HIGH (ref 0–35)
Albumin: 1.8 g/dL — ABNORMAL LOW (ref 3.5–5.2)
Alk Phos: 444 U/L — ABNORMAL HIGH (ref 35–105)
Anion Gap: 13 (ref 7–16)
Bilirubin,Total: 0.9 mg/dL (ref 0.0–1.2)
CO2: 22 mmol/L (ref 20–28)
Calcium: 7.8 mg/dL — ABNORMAL LOW (ref 8.6–10.2)
Chloride: 97 mmol/L (ref 96–108)
Creatinine: 0.83 mg/dL (ref 0.51–0.95)
GFR,Black: 82 *
GFR,Caucasian: 71 *
Glucose: 224 mg/dL — ABNORMAL HIGH (ref 60–99)
Lab: 22 mg/dL — ABNORMAL HIGH (ref 6–20)
Potassium: 4 mmol/L (ref 3.3–5.1)
Sodium: 132 mmol/L — ABNORMAL LOW (ref 133–145)
Total Protein: 4.4 g/dL — ABNORMAL LOW (ref 6.3–7.7)

## 2017-04-18 LAB — CBC AND DIFFERENTIAL
Baso # K/uL: 0 10*3/uL (ref 0.0–0.1)
Basophil %: 0 %
Eos # K/uL: 0 10*3/uL (ref 0.0–0.4)
Eosinophil %: 0 %
Hematocrit: 21 % — ABNORMAL LOW (ref 34–45)
Hemoglobin: 6.9 g/dL — ABNORMAL LOW (ref 11.2–15.7)
Lymph # K/uL: 0.2 10*3/uL — ABNORMAL LOW (ref 1.2–3.7)
Lymphocyte %: 0.9 %
MCH: 30 pg/cell (ref 26–32)
MCHC: 33 g/dL (ref 32–36)
MCV: 90 fL (ref 79–95)
Mono # K/uL: 0.2 10*3/uL (ref 0.2–0.9)
Monocyte %: 0.9 %
Neut # K/uL: 20.1 10*3/uL — ABNORMAL HIGH (ref 1.6–6.1)
Nucl RBC # K/uL: 0 10*3/uL (ref 0.0–0.0)
Nucl RBC %: 0 /100 WBC (ref 0.0–0.2)
Platelets: 384 10*3/uL — ABNORMAL HIGH (ref 160–370)
RBC: 2.3 MIL/uL — ABNORMAL LOW (ref 3.9–5.2)
RDW: 17.8 % — ABNORMAL HIGH (ref 11.7–14.4)
Seg Neut %: 73.4 %
WBC: 21.2 10*3/uL — ABNORMAL HIGH (ref 4.0–10.0)

## 2017-04-18 LAB — RED BLOOD CELLS
Coded Blood type: 600
Component blood type: A NEG
Dispense status: TRANSFUSED

## 2017-04-18 LAB — DIFF MANUAL
Bands %: 22 % (ref 0–10)
Diff Based On: 117 CELLS
Metamyelocyte %: 3 % — ABNORMAL HIGH (ref 0–1)

## 2017-04-18 LAB — TRIGLYCERIDES: Triglycerides: 223 mg/dL — AB

## 2017-04-18 LAB — MAGNESIUM: Magnesium: 1.9 mg/dL (ref 1.6–2.5)

## 2017-04-18 LAB — THAWED PLASMA
Coded Blood type: 600
Component blood type: A NEG
Dispense status: TRANSFUSED

## 2017-04-18 LAB — RBC MORPHOLOGY

## 2017-04-18 LAB — PHOSPHORUS: Phosphorus: 3.1 mg/dL (ref 2.7–4.5)

## 2017-04-18 LAB — APTT: aPTT: 34 s (ref 25.8–37.9)

## 2017-04-18 MED ORDER — HEPARIN LOCK FLUSH 100 UNIT/ML IV SOLN *WRAPPED*
100.0000 [IU] | INTRAVENOUS | Status: DC | PRN
Start: 2017-04-18 — End: 2017-04-19
  Filled 2017-04-18 (×2): qty 1

## 2017-04-18 MED ORDER — FAT EMULSION SOYBEAN OIL 20 % IV EMUL *WRAPPED*
60.0000 g | Freq: Every day | INTRAVENOUS | Status: AC
Start: 2017-04-18 — End: 2017-04-19
  Administered 2017-04-18 – 2017-04-19 (×12): 60 g via INTRAVENOUS
  Filled 2017-04-18: qty 300

## 2017-04-18 MED ORDER — THIAMINE HCL 100 MG/ML IJ SOLN *I*
INTRAVENOUS | Status: AC
Start: 2017-04-18 — End: 2017-04-19
  Filled 2017-04-18: qty 636

## 2017-04-18 MED ORDER — PLASMA-LYTE IV BOLUS *WRAPPED*
500.0000 mL | Freq: Once | INTRAVENOUS | Status: AC
Start: 2017-04-18 — End: 2017-04-18
  Administered 2017-04-18: 500 mL via INTRAVENOUS

## 2017-04-18 MED ORDER — PLASMA-LYTE IV BOLUS *WRAPPED*
1000.0000 mL | Freq: Once | INTRAVENOUS | Status: AC
Start: 2017-04-18 — End: 2017-04-18
  Administered 2017-04-18: 1000 mL via INTRAVENOUS

## 2017-04-18 MED ORDER — SODIUM CHLORIDE 0.9 % IV SOLN WRAPPED *I*
3.0000 mL/h | Status: DC
Start: 2017-04-18 — End: 2017-04-18

## 2017-04-18 MED ORDER — HYDROMORPHONE HCL 2 MG/ML IJ SOLN *WRAPPED*
1.0000 mg | INTRAMUSCULAR | Status: DC | PRN
Start: 2017-04-18 — End: 2017-04-22
  Administered 2017-04-18 – 2017-04-21 (×7): 1 mg via INTRAVENOUS
  Filled 2017-04-18 (×7): qty 1

## 2017-04-18 MED ORDER — POTASSIUM CHLORIDE 20 MEQ/50ML IV SOLN *I*
20.0000 meq | Freq: Once | INTRAVENOUS | Status: AC
Start: 2017-04-18 — End: 2017-04-18
  Administered 2017-04-18: 20 meq via INTRAVENOUS
  Filled 2017-04-18: qty 50

## 2017-04-18 MED ORDER — HEPARIN LOCK FLUSH 100 UNIT/ML IV SOLN *WRAPPED*
500.0000 [IU] | Freq: Once | INTRAVENOUS | Status: DC | PRN
Start: 2017-04-18 — End: 2017-04-18

## 2017-04-18 MED ORDER — HEPARIN LOCK FLUSH 10 UNIT/ML IJ SOLN WRAPPED *I*
50.0000 [IU] | INTRAVENOUS | Status: DC | PRN
Start: 2017-04-18 — End: 2017-06-19
  Administered 2017-04-18 – 2017-05-06 (×3): 50 [IU]
  Filled 2017-04-18 (×12): qty 5

## 2017-04-18 MED ORDER — ALBUMIN HUMAN 25 % IV SOLN (250 MG/ML) *I*
25.0000 g | Freq: Four times a day (QID) | INTRAVENOUS | Status: AC
Start: 2017-04-18 — End: 2017-04-18
  Administered 2017-04-18 (×4): 25 g via INTRAVENOUS
  Filled 2017-04-18 (×5): qty 100

## 2017-04-18 MED ORDER — CALCIUM GLUCONATE 9.4 MEQ (2,000 MG) IN NS 110 ML *I*
9.4000 meq | INTRAVENOUS | Status: AC
Start: 2017-04-18 — End: 2017-04-18
  Administered 2017-04-18 (×2): 9.4 meq via INTRAVENOUS
  Filled 2017-04-18: qty 100

## 2017-04-18 MED ORDER — PLASMA-LYTE IV SOLN *WRAPPED*
500.0000 mL/h | Freq: Once | Status: AC
Start: 2017-04-18 — End: 2017-04-18
  Administered 2017-04-18: 500 mL/h via INTRAVENOUS

## 2017-04-18 MED ORDER — INSULIN REGULAR HUMAN 100 UNIT/ML IJ SOLN *I*
0.0000 [IU] | Freq: Four times a day (QID) | INTRAMUSCULAR | Status: DC
Start: 2017-04-18 — End: 2017-05-07
  Administered 2017-04-18 – 2017-04-19 (×4): 5 [IU] via SUBCUTANEOUS
  Administered 2017-04-19 (×2): 4 [IU] via SUBCUTANEOUS
  Administered 2017-04-19 – 2017-04-20 (×5): 3 [IU] via SUBCUTANEOUS
  Administered 2017-04-21 (×2): 4 [IU] via SUBCUTANEOUS
  Administered 2017-04-21: 3 [IU] via SUBCUTANEOUS
  Administered 2017-04-21 – 2017-04-22 (×2): 4 [IU] via SUBCUTANEOUS
  Administered 2017-04-22 (×3): 3 [IU] via SUBCUTANEOUS
  Administered 2017-04-23: 4 [IU] via SUBCUTANEOUS
  Administered 2017-04-23: 3 [IU] via SUBCUTANEOUS
  Administered 2017-04-23 (×2): 4 [IU] via SUBCUTANEOUS
  Administered 2017-04-23 – 2017-04-24 (×3): 3 [IU] via SUBCUTANEOUS
  Administered 2017-04-24: 4 [IU] via SUBCUTANEOUS
  Administered 2017-04-25 – 2017-04-28 (×14): 3 [IU] via SUBCUTANEOUS
  Administered 2017-04-28 – 2017-04-29 (×2): 4 [IU] via SUBCUTANEOUS
  Administered 2017-04-29 – 2017-05-04 (×20): 3 [IU] via SUBCUTANEOUS
  Administered 2017-05-04: 4 [IU] via SUBCUTANEOUS
  Administered 2017-05-04 – 2017-05-07 (×12): 3 [IU] via SUBCUTANEOUS

## 2017-04-18 NOTE — Consults (Signed)
Medical Nutrition Therapy- Weekly TPN Review Late Entry    Patient reviewed with interdisciplinary team at [place] on 04/17/17    See most recent RD note 04/18/17 for full recommendations    TPN Approval  Approval for parenteral nutrition use to continue for 7 days.  Original Start Date: 04/09/17 Approved by RD/Clinical Nutrition Specialist  D/C Plan: TPN will be discontinued once gastric outlet obstruction resolved.    Einar Gip, RD, CNSC [phone]

## 2017-04-18 NOTE — Plan of Care (Signed)
Mobility     Functional status is maintained or improved - Geriatric Maintaining        Nutrition     Patient's nutritional status is maintained or improved Maintaining        Post-Operative Complications     Patient will remain free from symptoms of infection-post op Maintaining        Safety     Patient will remain free of falls Maintaining          Bowel Elimination     Elimination patterns are normal or improving Progressing towards goal        Cognitive function     Cognitive function will be maintained Progressing towards goal        Impaired Gas Exchange     Patient will maintain adequate oxygenation Progressing towards goal        Pain/Comfort     Patient's pain or discomfort is manageable Progressing towards goal        Post-Operative Bowel Elimination     Elimination pattern is normal or improving Progressing towards goal        Post-Operative Hemodynamic Stability     Maintain Hemodynamic Stability Progressing towards goal

## 2017-04-18 NOTE — Progress Notes (Signed)
Report Given To  Abbe Amsterdam, RN      Descriptive Sentence / Reason for Admission   PMHx: pancreatic adenocarcinoma    1/25 s/p open Whipple. She is being admitted to SICU for post-operative monitoring, notably hemodynamic status and post-op pain control.      Active Issues / Relevant Events   2/8: upper GI series done  2/12: Continued confusion/pain on CC5, blood/urine culture sent. CT abd done-showed hematoma near common bile duct. IR for biliary drain. Intubated for inc RR. Transferred to [address]. 2L IVF for HoTn.   2/13N: 5.5 L total for hypotension. A-line and CVC placed. Crit 18- 1 unit PRBC given, repeat draw 21. Lactate trending down.  Levo started. Days: ECHO (bilateral pleural effusions), started plasmalyte @ 100, FiO2 weaned to 40%, IV methadone restarted  2/14N: 2.5L p-lyte, levo off DAYS: PEEP weaned, Fi02 40% --> 100% d/t acute episode hypoxia. 1 uPRBC for H/H 6.9/21; d/c Levo      To Do List   Goal: SBP > 90 or MAP > 60   Q6H ABG with panel   Flush NG q3h 60cc   Restart Heparin gtt?   Flush biliary drain BID w/ 5cc NS (2000 and 0800)    Please remember to complete care plan and education record at 0400 and 1600 hours      Anticipatory Guidance / Discharge Planning  HAC: ETT, NGT, Mediport, PIV x3, biliary drain, foley, left IJ TLIC, & left brachial a-line    Daughter updated at bedside 2/14

## 2017-04-18 NOTE — Progress Notes (Signed)
SICU Progress Note    Amanda Cullenis a 72 y.o.female with PMHx PE,pancreatic adenocarcinoma s/p uncomplicated Whipple 11/01/54 with Dr Ron Agee. 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 Discharged to the floor 1/28. Developed n/v beginning 1/31 and NGT placed with continued high output and concern for gastric outlet obstruction distal to the gastro-jejunostomy site. On 2/12 developed tachypnea, mental status changes, and worsening transaminitis. CT ABD/pelvis demonstrated a hematoma causing severe compression of the main portal vein and common bile duct. Decision was made to place an IR percutaneous bilary drain. Prior to placement patient had increased WOB and was intubated for the procedure.  Transferred to SICU post-procedure for further management. Septic shock post drain placement requiring vasopressors.     Interval History: 2.5L IVF o/n - levo to off. Transaminitis worsening. T.bili down to 0.9. Lactate stable    Past Med/Sx History:   Past Medical History:   Diagnosis Date    Acute kidney failure 03/31/2017    Cancer     Depression     Fibromyalgia     Hypothyroidism        Physical Exam by Systems:  See attending physician physical exam     Assessment and Plan for Active/Followed Hospital Problems:   Active Hospital Problems    Diagnosis    *!*Pancreatic adenocarcinoma s/p Whipple 03/29/17     -S/p open Whipple on 1/25 with Dr. Ron Agee  -Developed gastric outlet obstruction 2/2 hematoma causing severe compression of the main portal vein and common bile duct.  -s/p external/internal PTC drain 2/12 in IR  -Started on Zosyn 2/12  -Continue Decadron for PONV  -Continue TPN with SSI for glucose control        Septic shock     - Occurring in setting of biliary obstruction s/p PTC external/internal placement for source control  - Blood cultures with NGTD, on Zosyn (2/12-) and Vanco (2/12-)  - On/Off levo gtt   - Goal SBP >90, MAP >60, trend  UOP      Acute respiratory failure     - Intubated 2/12 for PTC placement  - Weaning ventilator as tolerated  - Does have h/o PE with anticoag on hold, echo 2/13 normal       hx of Pulmonary embolism     - PE 01/18/17 on CT chest   - This hospitalization has been on therapeutic Lovenox, and Heparin gtt\  - Currently all anticoagulation on hold in setting of expanding hematoma  - Will restart when surgeon finds acceptable      Acute blood loss anemia     - H/H 6.9/21  - Last PRBC 2/13   - Will monitor and transfuse prn        Cancer associated pain     - Methadone at home  -Pain management being guided by patient's primary pain physician Dr Irven [place]  -Continue methadone at 5ng IV BID (09:00 & 21:00) and 7.5 mg IV qDay @ 13:00  -Fenatnyl gtt while intubated and then would transition back to dilaudid PCA        Depression     -Continue Elavil and Lexapro      Hypothyroidism     -Synthroid          Diagnoses for this hospitalization include:  Acute Respiratory Failure, Septic Shock and Shock  Septic       ---SICU Critical Care  Bundle reviewed with bedside nursing    Author:  Fredirick Maudlin, PA  as of: 04/18/2017  at: 9:40 AM

## 2017-04-18 NOTE — Progress Notes (Signed)
---  Critical Care Bundle--    Fluids & Electrolytes: MIVF stopped, levo @ 4    Nutrition / last BM: TPN/lipids    Mobility plan: as tolerated    Neuro / sedation / pain / sedation holiday: fentanyl @ 125    Respiratory weaning (and PST, TC) / pulmonary toilet: on the vent, 40% peep of 10    Delirium / sleep / prevention / intervention: slept well overnight neg for delirium    List of antimicrobials: zosyn, vanco    Drains / Lines / Tubes: ETT, art line, mediport, TL CVC, PIV x 3, biliary drain, foley, NGT    Indication for foley: accurate I/O    DVT prophylaxis / a PTT: SCDs, aPTT 34 2/13 2330    Indications for gastric acid suppression: protonix    Labs ordered:  Daily CBC, CMP, mag, phos, aptt, pt/inr  T/Th/sat triglycerides  Q6 art gas    Family concern / updates: daughter updated at bedside 2/13    Patient likes to be called: [name]    Code status: full    Disposition / Level: ICU level 1    Nursing reiterate plan for day: Y          Author: Doneen Poisson, RN  as of: 04/18/2017  at: 10:09 AM

## 2017-04-18 NOTE — Progress Notes (Signed)
Report Given To  Abbe Amsterdam, RN    Descriptive Sentence / Reason for Admission   PMHx: pancreatic adenocarcinoma    1/25 s/p open Whipple. She is being admitted to SICU for post-operative monitoring, notably hemodynamic status and post-op pain control.    Active Issues / Relevant Events   2/8: upper GI series done  2/12: Continued confusion/pain on CC5, blood/urine culture sent. CT abd done; hematoma near common bile duct found; IR for biliary drain. Intubated for inc RR. Transferred to [address]00. 2L IVF for HoTn.   2/13N: 5.5 L for HoTN and Levo; A-line and CVC placed. DAYS: ECHO (bilateral pleural effusions), IV methadone restarted  2/14N: 2.5L p-lyte, levo off DAYS: PEEP weaned throughout the day, 1 uPRBC for H/H 6.9/21; MIVF d/c'd; Fi02 40% --> 100% d/t acute episode of hypoxia during repositioning in the evening    To Do List   Goal: SBP > 90 or MAP > 60   PT consult   Q6H ABG with panel; Wean FiO2 for goal PO2 60-80%   Flush NG q3h 60cc   Restart Heparin gtt?   Mediport to be de-accessed by [facility] 2/14   Flush biliary drain BID w/ 5cc NS (2000 and 0800)    Please remember to complete care plan and education record at 0400 and 1600 hours    Anticipatory Guidance / Discharge Planning  [facility] Prevention: ETT, NGT, LIJ CVC, L brachial a-line, Mediport, PIV x3, biliary drain, Foley     Daughter updated at bedside 2/14

## 2017-04-18 NOTE — Progress Notes (Addendum)
Critical Care Medicine Attending Note:      If there are key historical elements or objective findings that I think deserve particular emphasis, I have recorded them below. A summary of key elements of our assessment and plans is listed in the resident / medical student / PA / NP's note. Please refer to this note for complete details of our mutually agreed upon findings, assessment, and recommendations.  The problem list reflects my input.    Summary of history of present illness:  The patient is a 72 year old woman with a medical history significant for hypertension, pulmonary embolism (diagnosed in November 2018 (on Lovenox), fibromyalgia, gastroesophageal reflux disease, hypothyroidism, and anxiety/depression. 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 She had a cholecystectomy that failed to relieve her symptoms. She has completed neoadjuvant chemotherapy and radiation therapy. She has required methadone for chronic abdominal pain. On 1/25,she underwent a Whipple procedure. The surgical procedure and anesthetic were uncomplicated. She was admitted to the SICU service for ongoing management and the expectation of complicated pain management. She did well and was discharged to the floor on 1/28. She developed nausea and vomiting on 1/31 prompting NG tube placement. She has since had high gastric output requiring TPN for nutrition. On 2/12, she developed tachypnea and confusion. She had a worsening transaminitis. CT of the abdomen/pelvis revealed a hematoma compressing the portal vein and common bile duct. She was taken to IR where she underwent placement of a percutaneous biliary drain. She had to be intubated for the procedure due to her marginal respiratory status. She was admitted to the SICU service for close observation and management of sepsis.  She was very  septic that night and following day requiring a large IV fluid volume resuscitation and relatively high dose norepinephrine infusion.  She has been improving.    Interval history:  No significant new problems noted overnight.     Objective Section:  BP: (88-142)/(49-75)   Temp:  [36.5 C (97.7 F)-36.9 C (98.4 F)]   Temp src: Temporal (02/14 0800)  Heart Rate:  [78-106]   Resp:  [19-33]   SpO2:  [87 %-96 %]     FiO2: 40 % (04/18/17 1042)        Intake/Output last 3 shifts:  I/O last 3 completed shifts:  02/13 0700 - 02/14 0659  In: 8427 (114.7 mL/kg) [I.V.:2737.4 (1.6 mL/kg/hr); Blood:200; Other:10; NG/GT:510; IV Piggyback:2829.6]  Out: 2235 (30.4 mL/kg) [Urine:1835 (1 mL/kg/hr); Emesis/NG output:400]  Net: 6192  Weight: 73.5 kg     Intake/Output this shift:  I/O this shift:  02/14 0700 - 02/14 1459  In: 1409.6 (19.2 mL/kg) [I.V.:534.6; Other:5; NG/GT:70; IV Piggyback:500]  Out: 205 (2.8 mL/kg) [Urine:205]  Net: 1204.6  Weight: 73.5 kg       Vent settings for last 24 hours:  Ventilator: Drager  MODE: CMV+  Ventilator On: Yes  FiO2: 40 %  AutoFlow: On  S RR: 14  Vt: 450 mL  PEEP: 10 cm H20  IT: 0.85 sec  Rise Time/Slope: 0.2  Sensitivity: 2  A RR: 35  PIP: 34 cm H2O  MAP: 22 cmH2O  MV: 15.1 l/min  A VT: 435 mL  Vt exp: 456 mL    Physical exam:      General: The patient is on the ventilator and is in no apparent distress.   She is sedated but awakens  easily and is calm, interactive, and appropriate.    Pulmonary: Symmetric breath sounds, coarse rhonchi bilaterally.     Cardiac: Regular rate and rhythm.     Abdominal: Hypoactive bowel sounds, softly distended.  The midline incision is dry and intact without erythema or swelling.      Extremities: Warm, pink, 2+ edema.      Labs:  Lab Results   Component Value Date    WBC 21.2 (H) 04/17/2017    HCT 21 (L) 04/17/2017    PLT 384 (H) 04/17/2017     Lab Results   Component Value Date    NA 132 (L) 04/17/2017    K 4.0 04/17/2017    CL 97 04/17/2017    CO2 22 04/17/2017     UN 22 (H) 04/17/2017    CREAT 0.83 04/17/2017    WBGLU 197 (H) 04/18/2017    PGLU 200 (H) 04/18/2017     Lab Results   Component Value Date    CA 7.8 (L) 04/17/2017    MG 1.9 04/17/2017    PO4 3.1 04/17/2017     Lab Results   Component Value Date    PTI 15.9 (H) 04/17/2017    INR 1.4 (H) 04/17/2017    PTT 34.0 04/17/2017     Lab Results   Component Value Date    ALT 531 (H) 04/17/2017    AST 782 (H) 04/17/2017    ALK 444 (H) 04/17/2017     Bilirubin,Total   Date Value Ref Range Status   04/17/2017 0.9 0.0 - 1.2 mg/dL Final           Lab results: 04/18/17  0606  04/16/17  1718   Lactate ART,WB 2.4*  < >  --    Lactate VEN,WB  --   --  4.6*   < > = values in this interval not displayed.           Currently Active/Followed Hospital Problems:  Active Hospital Problems    Diagnosis    *!*Pancreatic adenocarcinoma s/p Whipple 03/29/17     -S/p open Whipple on 1/25 with Dr. Ron Agee  -Developed gastric outlet obstruction 2/2 hematoma causing severe compression of the main portal vein and common bile duct.  -s/p external/internal PTC drain 2/12 in IR  -Started on Zosyn 2/12  -Continue Decadron for PONV  -Continue TPN with SSI for glucose control        Septic shock     - Occurring in setting of biliary obstruction s/p PTC external/internal placement for source control  - Blood cultures with NGTD, on Zosyn (2/12-) and Vanco (2/12-)  - On/Off levo gtt   - Goal SBP >90, MAP >60, trend UOP      Acute respiratory failure     - Intubated 2/12 for PTC placement  - Weaning ventilator as tolerated  - Does have h/o PE with anticoag on hold, echo 2/13 normal       hx of Pulmonary embolism     - PE 01/18/17 on CT chest   - This hospitalization has been on therapeutic Lovenox, and Heparin gtt\  - Currently all anticoagulation on hold in setting of expanding hematoma  - Will restart when surgeon finds acceptable      Acute blood loss anemia     - H/H 6.9/21  - Last PRBC 2/13   - Will monitor and transfuse prn        Cancer associated  pain     - Methadone  at home  -Pain management being guided by patient's primary pain physician Dr Irven Baltimore  -Continue methadone at 5ng IV BID (09:00 & 21:00) and 7.5 mg IV qDay @ 13:00  -Fenatnyl gtt while intubated and then would transition back to dilaudid PCA        Depression     -Continue Elavil and Lexapro      Hypothyroidism     -Synthroid            Attending summary impressions:     The patient is critically ill with severe biliary sepsis due to obstruction of the afferent limb of the reux anastomosis for a Whipple procedure.  She was initially in septic shock in the hours following percutaneous biliary drain that extends into the duodenum (afferent roux limb).   She has a resolved SIRS.  She has been nearly weaned off the norepinephrine.  Her serum lactate level is trending down and is near normal.  Clinically, she is perfusing well.   She has a history of PE and has been anticoagulated pre-operatively and during this hospitalization.  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   She has acute respiratory failure.  She had an ARDS picture.  She is getting better with decreasing oxygen and PEEP requirements.   She has improving severe sepsis.  She is afebrile and her sepsis physiology is improving.  She has a worse leukocytosis.  Cultures are all negative.  She remains on vancomycin and Zosyn.   Her shock liver is improving.  Her liver function tests are either stable or are improving.   The patient has a significant anemia and will be transfused with packed red blood cells.    The patient has good to excellent urine output and normal renal function tests consistent with excellent renal function.   She remains NPO.  She is getting TPN.   We are working on increasing her activity.   She is critically ill but is making progress.    Attending Attestation    This is a high complexity patient.     _0  This patient is critically ill with at least 1 organ system failure including  severe sepsis, acute respiratory failure, shock liver, and anemia. The care I have delivered involved high complexity decision making to assess, manipulate, and support vital system function(s), to treat the vital organ system failure and/or prevent further deterioration of the patient's condition. All nursing documentation, laboratory data, test results, and radiographs were reviewed and interpreted by me. I have established the management plan for this patient's critical illness and have been immediately available to assist with patient care. My documented total critical care time reflects my own patient care time and does not include teaching or procedure time.     Critical care time: Personal time spent - (exclusive of performance of any procedures performed in the last 24 hours):  40 minutes    Signed by: Lorenza Chick, MD as of 04/18/2017 at 11:46 AM.

## 2017-04-18 NOTE — Progress Notes (Signed)
Report Given To  Rheagan and Margreta Journey, RNs      Descriptive Sentence / Reason for Admission   PMHx: pancreatic adenocarcinoma    1/25 s/p open Whipple. She is being admitted to SICU for post-operative monitoring, notably hemodynamic status and post-op pain control.      Active Issues / Relevant Events   2/8: upper GI series done  2/12: Continued confusion/pain on CC5, blood/urine culture sent. CT abd done-showed hematoma near common bile duct. IR for biliary drain. Intubated for inc RR. Transferred to [address]3600. 2L IVF for HoTn.   2/13N: 5.5 L total for hypotension. A-line and CVC placed. Crit 18- 1 unit PRBC given, repeat draw 21. Lactate trending down.  Levo started. Days: ECHO (bilateral pleural effusions), started plasmalyte @ 100, FiO2 weaned to 40%, IV methadone restarted  2/14N: 2.5L p-lyte, levo off      To Do List   Goal: SBP > 90   Q6H ABG with panel   Flush NG q3h 60cc   Restart Heparin gtt?   Flush biliary drain BID w/ 5cc NS (0000 and 1200)    Please remember to complete care plan and education record at 0400 and 1600 hours      Anticipatory Guidance / Discharge Planning  [facility]: ETT, NGT, Mediport, PIV x3, biliary drain, foley, left IJ TLIC, & left brachial a-line    Daughter updated at bedside 2/13

## 2017-04-18 NOTE — Consults (Signed)
Medical Nutrition Therapy - Follow Up      Patient Summary: 71 yo female w/ pancreatic adenocarcinoma s/p uncomplicated Whipple 03/29/17; post-operative course complicated by concern for gastric outlet obstruction distal to the gastro-jejunostomy site, CT ABD/pelvis demonstrated a hematoma causing severe compression of the main portal vein and common bile duct.  S/p IR percutaneous bilary drain on 04/16/17.  Pt transferred to SICU service on 04/16/17 for severe biliary sepsis, septic shock, shock liver, acute respiratory failure, h/o pulmonary embolism, acute blood loss anemia, cancer associated pain, depression, hypothyroidism, and hypotension.    Pertinent Meds: reviewed; includes norepinephrine gtt, Zosyn IV, vancomycin IV, albumin IV, levothyroxine, decadron, sliding scale insulin - 7 units given 2/14, pantoprazole IV, Plasmalyte boluses - given 2/14, potassium chloride IV - given 2/13, potassium phosphate IV - given 2/13     IVF: Plasmalyte @ 100 mL/hr; d/c'd 2/14 0719    Labs:       Lab results: 04/17/17  2344 04/17/17  1048 04/17/17  0044 04/16/17  1718   Sodium 132* 133 133 131*   Potassium 4.0 3.7 3.8 3.6   Chloride 97 96 97 96   CO2 22 21 21 19*   UN 22* 22* 20 20   Creatinine 0.83 0.96* 0.95 0.73   GFR,Caucasian 71 60 61 83   GFR,Black 82 69 70 96   Glucose 224* 179* 74 184*   Calcium 7.8* 7.9* 7.9* 7.5*   Total Protein 4.4*  --  4.1* 5.0*   Albumin 1.8*  --  1.9* 2.3*   ALT 531*  --  198* 271*   AST 782*  --  180* 221*   Alk Phos 444*  --  488* 669*   Bilirubin,Total 0.9  --  1.3* 1.4*          Recent Labs  Lab 04/17/17  2344 04/17/17  1807 04/17/17  0044   Magnesium 1.9  --  1.7   Phosphorus 3.1 2.7 1.2*         Lab results: 04/17/17  2344 04/17/17  0044 04/16/17  1718   Albumin 1.8* 1.9* 2.3*      Recent Labs  Lab 04/18/17  0606 04/17/17  2344 04/17/17  2343  04/17/17  0044 04/16/17  2201   Hematocrit  --  21*  --   --  21* 18*   Hemoglobin 6.5* 6.9* 7.5*  < > 6.8*   7.3* 5.8*   MCV  --  90  --   --  91   --    < > = values in this interval not displayed.     Lab results: 04/18/17  0606 04/17/17  2344 04/17/17  2343 04/17/17  1807 04/17/17  1150 04/17/17  1048 04/17/17  0552 04/17/17  0044 04/16/17  1718  04/16/17  0831   Glucose  --  224*  --   --   --  179*  --  74 184*  --  144*   Glucose POCT 200*  --  210* 221* 182*  --  138*  --   --   < >  --    < > = values in this interval not displayed.    No results for input(s): HA1C in the last 8760 hours.     Lab results: 04/16/17  0831   Triglycerides 46     No results for input(s): FE, IBC, FER, FESAT in the last 720 hours.  No results for input(s): VIDD2, VIDD3, VID25 in the last 8760 hours.       I/O 8491/2476 (urine 1905, NG 300, biliary tube 270)  Last BM: 1/30; medium mucous tan stool x 1  NG tube: low to intermittent suction    Access: triple lumen left IJ CVC placed 04/17/17, Mediport placed 10/04/16    Food allergies: NKFA    Current diet: (1/31) NPO  Supplements: none    TPN: TPN (2 in 1) Adult Continuous + Fats:   Rate - 75 mL/hr Volume - 1800 mL/d   Amino acids - 53 g/L   Dextrose - 167 g/L   Standard additives except the following   96 mEq/L Na+   2/3 Cl-/1/3 acetate   1 mg folic acid   751 mg thiamine   Fat Emulsion 20% infusion 60 g/d; 300 mL/d infused over 12 hr    Nutrition Focused Physical Exam:    Edema: generalized +2 edema per RN shift assessment  Abdomen: hypoactive BS, distended per RN shift assessment  Skin: ecchymosis/bruising, abdomen surgical wound CDI per RN shift assessment  Body fat stores: Intact  Lean body mass stores: Mild depletion in temporalis muscle    Anthropometrics:  Height: 170.2 cm (5' 7")    Current Weight: 73.5 kg (162 lb), admit (1/25) 72 kg; 100% IBW; 105% UBW  Ideal Body Weight: 72.3 kg + 10%; adjusted for pts >65 yo  Weight Hx: (12/14/16) 73.3 kg (10/16/16) 68.8 kg  BMI: 24.9 kg/(m^2) normal for pts >61 yo    Estimated Nutrient Needs: (Based on admit wt 72 kg)   1800 - 2160 kcal/d (25 - 30 kcal/kg)  110 - 145 g protein/d (1.5 -  2.0 g protein/kg)  1800 - 2160 mL fluid/d (25 - 30 mL/kg)    Nutrition Assessment and Diagnosis:   Pt. continues to be at high nutrition risk d/t 100% nutrition needs provided via parenteral nutrition. Current TPN provides 2004 kcal/d (28 kcal/kg), 95 g protein/d (1.3 g/kg), 301 g dextrose/d and 2100 mL fluid/d; meeting 111% minimal calorie needs and 86% minimal protein needs.  TPN is appropriate in setting of gastric outlet obstruction.  Hypophosphatemia and low normal K+ on 2/13; received IV repletion and now K+/PO4 WNL.  Noted elevated LFTs; consider checking carnitine level.  Noted elevated FSBGs - on steroid and sliding scale insulin.    Malnutrition Status: Pt with evidence of moderate malnutrition on 04/09/17.  Nutrition Intervention:   1. Continue current TPN.  2. On 2/15, recommend the following TPN (2 in 1) Adult Continuous + Fats:   Rate - 75 mL/hr Volume - 1800 mL/d   Amino acids - 70 g/L   Dextrose - 150 g/L   Standard additives except the following   2/3 Cl-/1/3 acetate   100 mEq/L Na+   9 mEq/L Ca++   Fat Emulsion 20% infusion 60 g/d; (Volume 300 mL/d) Administer over 12 hr  Goal TPN will provide 2022 kcal/d, 126 g protein/d, 270 g dextrose/d and 2100 mL fluid/d (1800 mL amino acid/dextrose solution + 300 mL lipid emulsion).  3. Can d/c additional folic acid and thiamine; received 1 week repletion.  4. Recommend ordering TPN Panel.     Includes: PO4, Mg, BMP (basic metabolic panel) daily for 3 days or until stable,  then 3 times per week.   CMP (comprehensive metabolic panel), Prealbumin, Triglycerides at baseline  and weekly.    BG every 6 hours until stable on goal dextrose.   Weight at baseline, then weekly.  5. Check free and total carnitine level.  If low, give 250 mg/d carnitine  in TPN.  Nutrition Monitoring/Evaluation:   1. Will monitor nutrition-related labs, weight trend, BM pattern.   2. Will follow up per high nutrition risk protocol.     , RD, CNSC pager#[phone]

## 2017-04-18 NOTE — Progress Notes (Addendum)
Surgical Oncology/Hepatobiliary Surgery Progress Note     LOS: 20 days     Subjective:    Was weaned off pressors overnight.  Received 3L IVF boluses.  Ventilator has been weaned to 10/40%.  Serum bilirubin continues to trend down.      Objective:    Vitals Sign Ranges for Past 24 Hours:  BP: (88-142)/(49-75)   Temp:  [36.5 C (97.7 F)-36.9 C (98.4 F)]   Temp src: Temporal (02/14 0800)  Heart Rate:  [84-106]   Resp:  [19-33]   SpO2:  [87 %-97 %]       Physical Exam:    General Appearance: intubated, following commands  HEENT: NGT with brown gastric fluid output  Cardiac: regular rate  Respiratory: mechanically ventilated  Abdomen: edematous, no apparent tenderness to palpation  Extremities: warm    Labs:    CBC:    Recent Labs  Lab 04/18/17  0606 04/17/17  2344  04/17/17  0044 04/16/17  2201  04/16/17  1718 04/16/17  1119 04/16/17  0100 04/15/17  1552  04/15/17  0025   WBC  --  21.2*  --  9.9  --   --  2.9* 7.4 9.9  --   --  7.1   Hemoglobin 6.5* 6.9*  < > 6.8*   7.3* 5.8*  < > 7.2*   7.7* 7.5* 6.7*  --   < > 6.6*   Hematocrit  --  21*  --  21* 18*  --  23* 24* 21*  --   < > 21*   Platelets  --  384*  --  309  --   --  413* 501* 413* 374*  --  379*   < > = values in this interval not displayed.    Metabolic Panel:    Recent Labs  Lab 04/17/17  2344 04/17/17  1807 04/17/17  1048 04/17/17  0044 04/16/17  1718 04/16/17  0831 04/16/17  0100 04/15/17  0025 04/14/17  0005   Sodium 132*  --  133 133 131* 131* 131* 132* 135   Potassium 4.0  --  3.7 3.8 3.6 3.9 4.0 3.7 4.2   Chloride 97  --  96 97 96 95* 95* 97 96   CO2 22  --  21 21 19* '22 24 25 24   ' UN 22*  --  22* '20 20 20 ' 21* 23* 22*   Creatinine 0.83  --  0.96* 0.95 0.73 0.74 0.74 0.70 0.72   Glucose 224*  --  179* 74 184* 144* 146* 132* 118*   Calcium 7.8*  --  7.9* 7.9* 7.5* 8.2* 8.1* 8.1* 8.4*   Magnesium 1.9  --   --  1.7 1.6  --  1.9 1.9 1.9   Phosphorus 3.1 2.7  --  1.2*  --   --  2.9 3.7 3.2          Assessment:    72 y.o. female with h/o recent PE and  pancreatic cancer POD # 20  status post whipple procedure complicated by delayed gastric emptying.  NGT replaced on 04/04/17. UGI on 04/12/17 concerning for obstruction just beyond the [place] in the efferent limb.  Course complicated on 9/32/35 by rising LFTs and sepsis with CT concerning for biliary tree obstruction and PV compression due to hematoma and afferent limb distention in setting of PE treatment. S/p PTC 04/16/17. She had a planned intubation on 04/16/17 prior to IR procedure due to respiratory insufficiency with tachypnea but O2  Sats table on 2L NC, but post-IR did have hypoxia.    Plan:    - NPO, IVF, NGT to LIWS  - PTC/biliary drain to gravity; can begin flushing drain with 10 cc NS qshift (can be done by nursing)  - NGT to LIWS  - IV vancomycin/Zosyn for cholangitis  - Hold therapeutic anticoagulation  - Wean ventilator as tolerated  - EGD by GI when clinically improved  - Appreciate SICU care      Amanda Crouch, MD  04/18/2017     9:31 AM  General Surgery Resident    [facility] and GI Surgery Attending:    I saw and evaluated the patient. I have reviewed and edited the resident's/fellow's note and confirm the findings and plan of care as documented above. Subtle improvements discussed with her daughter who is appreciative.    Amanda Eke, MD

## 2017-04-19 DIAGNOSIS — A419 Sepsis, unspecified organism: Secondary | ICD-10-CM | POA: Diagnosis not present

## 2017-04-19 LAB — ART GASES / WHOLE BLOOD PANEL
Base Excess, Arterial: -1 mmol/L (ref <=2)
Base Excess, Arterial: -2 mmol/L (ref <=2)
Base Excess, Arterial: 0 mmol/L (ref <=2)
Base Excess, Arterial: 2 mmol/L (ref <=2)
Base Excess, Arterial: 5 mmol/L — ABNORMAL HIGH (ref <=2)
CO2,ART (Calc): 25 mmol/L (ref 23–28)
CO2,ART (Calc): 26 mmol/L (ref 23–28)
CO2,ART (Calc): 27 mmol/L (ref 23–28)
CO2,ART (Calc): 27 mmol/L (ref 23–28)
CO2,ART (Calc): 29 mmol/L — ABNORMAL HIGH (ref 23–28)
CO: 0.2 %
CO: 0.3 %
CO: 0.3 %
CO: 0.3 %
CO: 0.6 %
FO2 Hb, Arterial: 92 % (ref 90–100)
FO2 Hb, Arterial: 93 % (ref 90–100)
FO2 Hb, Arterial: 93 % (ref 90–100)
FO2 Hb, Arterial: 94 % (ref 90–100)
FO2 Hb, Arterial: 95 % (ref 90–100)
Glucose,WB: 138 mg/dL — ABNORMAL HIGH (ref 60–99)
Glucose,WB: 140 mg/dL — ABNORMAL HIGH (ref 60–99)
Glucose,WB: 160 mg/dL — ABNORMAL HIGH (ref 60–99)
Glucose,WB: 194 mg/dL — ABNORMAL HIGH (ref 60–99)
Glucose,WB: 195 mg/dL — ABNORMAL HIGH (ref 60–99)
HCO3, Arterial: 24 mmol/L (ref 21–26)
HCO3, Arterial: 25 mmol/L (ref 21–26)
HCO3, Arterial: 25 mmol/L (ref 21–26)
HCO3, Arterial: 26 mmol/L (ref 21–26)
HCO3, Arterial: 28 mmol/L — ABNORMAL HIGH (ref 21–26)
Hemoglobin: 7.9 g/dL — ABNORMAL LOW (ref 11.2–15.7)
Hemoglobin: 8 g/dL — ABNORMAL LOW (ref 11.2–15.7)
Hemoglobin: 8.3 g/dL — ABNORMAL LOW (ref 11.2–15.7)
Hemoglobin: 8.5 g/dL — ABNORMAL LOW (ref 11.2–15.7)
Hemoglobin: 8.6 g/dL — ABNORMAL LOW (ref 11.2–15.7)
ICA @7.4,WB: 4.3 mg/dL — ABNORMAL LOW (ref 4.8–5.2)
ICA @7.4,WB: 4.5 mg/dL — ABNORMAL LOW (ref 4.8–5.2)
ICA @7.4,WB: 4.5 mg/dL — ABNORMAL LOW (ref 4.8–5.2)
ICA @7.4,WB: 4.5 mg/dL — ABNORMAL LOW (ref 4.8–5.2)
ICA @7.4,WB: 4.8 mg/dL (ref 4.8–5.2)
ICA Uncorr,WB: 4.3 mg/dL
ICA Uncorr,WB: 4.4 mg/dL
ICA Uncorr,WB: 4.5 mg/dL
ICA Uncorr,WB: 4.5 mg/dL
ICA Uncorr,WB: 4.7 mg/dL
Lactate ART,WB: 1.1 mmol/L — ABNORMAL HIGH (ref 0.3–0.8)
Lactate ART,WB: 1.1 mmol/L — ABNORMAL HIGH (ref 0.3–0.8)
Lactate ART,WB: 1.6 mmol/L — ABNORMAL HIGH (ref 0.3–0.8)
Lactate ART,WB: 1.6 mmol/L — ABNORMAL HIGH (ref 0.3–0.8)
Lactate ART,WB: 1.7 mmol/L — ABNORMAL HIGH (ref 0.3–0.8)
Methemoglobin: 0.1 % (ref 0.0–1.0)
Methemoglobin: 0.1 % (ref 0.0–1.0)
Methemoglobin: 0.2 % (ref 0.0–1.0)
Methemoglobin: 0.3 % (ref 0.0–1.0)
Methemoglobin: 0.4 % (ref 0.0–1.0)
NA, WB: 131 mmol/L — ABNORMAL LOW (ref 135–145)
NA, WB: 132 mmol/L — ABNORMAL LOW (ref 135–145)
NA, WB: 132 mmol/L — ABNORMAL LOW (ref 135–145)
NA, WB: 133 mmol/L — ABNORMAL LOW (ref 135–145)
NA, WB: 134 mmol/L — ABNORMAL LOW (ref 135–145)
Potassium,WB: 3.2 mmol/L — ABNORMAL LOW (ref 3.4–4.7)
Potassium,WB: 3.3 mmol/L — ABNORMAL LOW (ref 3.4–4.7)
Potassium,WB: 3.4 mmol/L (ref 3.4–4.7)
Potassium,WB: 3.5 mmol/L (ref 3.4–4.7)
Potassium,WB: 3.5 mmol/L (ref 3.4–4.7)
pCO2, Arterial: 36 mm Hg (ref 35–46)
pCO2, Arterial: 37 mm Hg (ref 35–46)
pCO2, Arterial: 42 mm Hg (ref 35–46)
pCO2, Arterial: 43 mm Hg (ref 35–46)
pCO2, Arterial: 43 mm Hg (ref 35–46)
pH: 7.37 (ref 7.35–7.45)
pH: 7.38 (ref 7.35–7.45)
pH: 7.39 (ref 7.35–7.45)
pH: 7.46 — ABNORMAL HIGH (ref 7.35–7.45)
pH: 7.52 — ABNORMAL HIGH (ref 7.35–7.45)
pO2,Arterial: 66 mm Hg — ABNORMAL LOW (ref 80–100)
pO2,Arterial: 67 mm Hg — ABNORMAL LOW (ref 80–100)
pO2,Arterial: 68 mm Hg — ABNORMAL LOW (ref 80–100)
pO2,Arterial: 75 mm Hg — ABNORMAL LOW (ref 80–100)
pO2,Arterial: 84 mm Hg (ref 80–100)

## 2017-04-19 LAB — CBC AND DIFFERENTIAL
Baso # K/uL: 0 10*3/uL (ref 0.0–0.1)
Basophil %: 0 %
Eos # K/uL: 0 10*3/uL (ref 0.0–0.4)
Eosinophil %: 0 %
Hematocrit: 22 % — ABNORMAL LOW (ref 34–45)
Hemoglobin: 7.7 g/dL — ABNORMAL LOW (ref 11.2–15.7)
Lymph # K/uL: 0.2 10*3/uL — ABNORMAL LOW (ref 1.2–3.7)
Lymphocyte %: 0.9 %
MCH: 30 pg/cell (ref 26–32)
MCHC: 35 g/dL (ref 32–36)
MCV: 87 fL (ref 79–95)
Mono # K/uL: 0.6 10*3/uL (ref 0.2–0.9)
Monocyte %: 2.6 %
Neut # K/uL: 21.6 10*3/uL — ABNORMAL HIGH (ref 1.6–6.1)
Nucl RBC # K/uL: 0 10*3/uL (ref 0.0–0.0)
Nucl RBC %: 0.1 /100 WBC (ref 0.0–0.2)
Platelets: 298 10*3/uL (ref 160–370)
RBC: 2.5 MIL/uL — ABNORMAL LOW (ref 3.9–5.2)
RDW: 18.1 % — ABNORMAL HIGH (ref 11.7–14.4)
Seg Neut %: 93.9 %
WBC: 22.7 10*3/uL — ABNORMAL HIGH (ref 4.0–10.0)

## 2017-04-19 LAB — MAGNESIUM: Magnesium: 2.2 mg/dL (ref 1.6–2.5)

## 2017-04-19 LAB — POCT GLUCOSE
Glucose POCT: 129 mg/dL — ABNORMAL HIGH (ref 60–99)
Glucose POCT: 134 mg/dL — ABNORMAL HIGH (ref 60–99)
Glucose POCT: 150 mg/dL — ABNORMAL HIGH (ref 60–99)
Glucose POCT: 180 mg/dL — ABNORMAL HIGH (ref 60–99)
Glucose POCT: 183 mg/dL — ABNORMAL HIGH (ref 60–99)

## 2017-04-19 LAB — RBC MORPHOLOGY

## 2017-04-19 LAB — COMPREHENSIVE METABOLIC PANEL
ALT: 305 U/L — ABNORMAL HIGH (ref 0–35)
AST: 204 U/L — ABNORMAL HIGH (ref 0–35)
Albumin: 3.5 g/dL (ref 3.5–5.2)
Alk Phos: 314 U/L — ABNORMAL HIGH (ref 35–105)
Anion Gap: 13 (ref 7–16)
Bilirubin,Total: 1.5 mg/dL — ABNORMAL HIGH (ref 0.0–1.2)
CO2: 23 mmol/L (ref 20–28)
Calcium: 8.9 mg/dL (ref 8.6–10.2)
Chloride: 99 mmol/L (ref 96–108)
Creatinine: 0.8 mg/dL (ref 0.51–0.95)
GFR,Black: 86 *
GFR,Caucasian: 75 *
Glucose: 187 mg/dL — ABNORMAL HIGH (ref 60–99)
Lab: 22 mg/dL — ABNORMAL HIGH (ref 6–20)
Potassium: 3.9 mmol/L (ref 3.3–5.1)
Sodium: 135 mmol/L (ref 133–145)
Total Protein: 5.8 g/dL — ABNORMAL LOW (ref 6.3–7.7)

## 2017-04-19 LAB — RED BLOOD CELLS
Coded Blood type: 600
Component blood type: A NEG
Dispense status: TRANSFUSED

## 2017-04-19 LAB — DIFF MANUAL
Bands %: 1 % (ref 0–10)
Diff Based On: 115 CELLS
Myelocyte %: 2 % — ABNORMAL HIGH (ref 0–0)

## 2017-04-19 LAB — APTT: aPTT: 29.8 s (ref 25.8–37.9)

## 2017-04-19 LAB — PHOSPHORUS: Phosphorus: 3.1 mg/dL (ref 2.7–4.5)

## 2017-04-19 LAB — PROTIME-INR
INR: 1.2 — ABNORMAL HIGH (ref 0.9–1.1)
Protime: 13.2 s — ABNORMAL HIGH (ref 10.0–12.9)

## 2017-04-19 MED ORDER — METHADONE HCL 10 MG/ML IJ SOLN *I*
7.5000 mg | Freq: Two times a day (BID) | INTRAMUSCULAR | Status: DC
Start: 2017-04-19 — End: 2017-04-22
  Administered 2017-04-19 – 2017-04-21 (×4): 7.5 mg via INTRAVENOUS
  Filled 2017-04-19 (×9): qty 0.75

## 2017-04-19 MED ORDER — FUROSEMIDE 145 MG/72.5 ML (2 MG/ML) INFUSION *I*
4.0000 mg/h | INTRAVENOUS | Status: DC
Start: 2017-04-19 — End: 2017-04-20
  Administered 2017-04-19 – 2017-04-20 (×24): 4 mg/h via INTRAVENOUS
  Filled 2017-04-19: qty 72.5

## 2017-04-19 MED ORDER — THIAMINE HCL 100 MG/ML IJ SOLN *I*
INTRAVENOUS | Status: DC
Start: 2017-04-19 — End: 2017-04-19

## 2017-04-19 MED ORDER — FAT EMULSION SOYBEAN OIL 20 % IV EMUL *WRAPPED*
60.0000 g | Freq: Every day | INTRAVENOUS | Status: DC
Start: 2017-04-19 — End: 2017-04-19

## 2017-04-19 MED ORDER — FAT EMULSION SOYBEAN OIL 20 % IV EMUL *WRAPPED*
60.0000 g | Freq: Every day | INTRAVENOUS | Status: AC
Start: 2017-04-19 — End: 2017-04-20
  Administered 2017-04-19 – 2017-04-20 (×12): 60 g via INTRAVENOUS
  Filled 2017-04-19: qty 300

## 2017-04-19 MED ORDER — METHADONE HCL 10 MG/ML IJ SOLN *I*
10.0000 mg | Freq: Every day | INTRAMUSCULAR | Status: DC
Start: 2017-04-20 — End: 2017-04-22
  Administered 2017-04-20 – 2017-04-21 (×2): 10 mg via INTRAVENOUS
  Filled 2017-04-19 (×5): qty 1

## 2017-04-19 MED ORDER — STERILE WATER FOR INJECTION (TPN USE ONLY) *I*
INTRAVENOUS | Status: AC
Start: 2017-04-19 — End: 2017-04-20
  Filled 2017-04-19: qty 840

## 2017-04-19 MED ORDER — HEPARIN SODIUM 5000 UNIT/ML SQ *I*
5000.0000 [IU] | Freq: Two times a day (BID) | SUBCUTANEOUS | Status: DC
Start: 2017-04-19 — End: 2017-04-20
  Administered 2017-04-19 – 2017-04-20 (×2): 5000 [IU] via SUBCUTANEOUS
  Filled 2017-04-19 (×2): qty 1

## 2017-04-19 NOTE — Plan of Care (Signed)
Mobility     Functional status is maintained or improved - Geriatric Maintaining        Safety     Patient will remain free of falls Maintaining          Bowel Elimination     Elimination patterns are normal or improving Progressing towards goal        Cognitive function     Cognitive function will be maintained Progressing towards goal        Impaired Gas Exchange     Patient will maintain adequate oxygenation Progressing towards goal        Nutrition     Patient's nutritional status is maintained or improved Progressing towards goal    Remains on TTPN/Lipids    Pain/Comfort     Patient's pain or discomfort is manageable Progressing towards goal    Pain controlled on fentanyl gtt, scheduled methadone, and PRN dilaudid    Post-Operative Bowel Elimination     Elimination pattern is normal or improving Progressing towards goal        Post-Operative Complications     Patient will remain free from symptoms of infection-post op Progressing towards goal        Post-Operative Hemodynamic Stability     Maintain Hemodynamic Stability Progressing towards goal

## 2017-04-19 NOTE — Progress Notes (Signed)
Report Given To  Mechele Claude & Jasmina RN"s      Descriptive Sentence / Reason for Admission   PMHx: pancreatic adenocarcinoma    1/25 s/p open Whipple. She is being admitted to SICU for post-operative monitoring, notably hemodynamic status and post-op pain control.      Active Issues / Relevant Events   2/8: upper GI series done  2/12: Continued confusion/pain on CC5, blood/urine culture sent. CT abd done; hematoma near common bile duct found; IR for biliary drain. Intubated for inc RR. Transferred to [address]3600. 2L IVF for HoTn.   2/13N: 5.5 L for HoTN and Levo; A-line and CVC placed. DAYS: ECHO (bilateral pleural effusions), IV methadone restarted  2/14N: 2.5L p-lyte, levo off DAYS: PEEP weaned throughout the day, 1 uPRBC for H/H 6.9/21; MIVF d/c'd; Fi02 40% --> 100% d/t acute episode of hypoxia during repositioning in the evening.  2/15N: CXR done. Vent weaned from 100% at start of shift. Mediport de-accessed.      To Do List   Goal: SBP > 90 or MAP > 60   PT consult   Q6H ABG with panel; Wean FiO2 for goal PO2 60-80%   Flush NG q3h 60cc   Restart Heparin gtt?     Flush biliary drain BID w/ 5cc NS (2000 and 0800)    Please remember to complete care plan and education record at 0400 and 1600 hours      Anticipatory Guidance / Discharge Planning  [facility] Prevention: ETT, NGT, LIJ CVC, L brachial a-line, Mediport, PIV x2, biliary drain, Foley     Daughter updated at bedside 2/14.

## 2017-04-19 NOTE — Progress Notes (Addendum)
Critical Care Medicine Attending Note:      If there are key historical elements or objective findings that I think deserve particular emphasis, I have recorded them below. A summary of key elements of our assessment and plans is listed in the resident / medical student / PA / NP's note. Please refer to this note for complete details of our mutually agreed upon findings, assessment, and recommendations.  The problem list reflects my input.    Summary of history of present illness:  The patient is a 72 year old woman with a medical history significant for hypertension, pulmonary embolism (diagnosed in November 2018, on Lovenox at home), fibromyalgia, gastroesophageal reflux disease, hypothyroidism, and anxiety/depression. 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 She had a cholecystectomy that failed to relieve her symptoms. She has completed neoadjuvant chemotherapy and radiation therapy. She has required methadone for chronic abdominal pain. On 1/25,she underwent a Whipple procedure. The surgical procedure and anesthetic were uncomplicated. She was admitted to the SICU service for ongoing management and the expectation of complicated pain management. She did well and was discharged to the floor on 1/28. She developed nausea and vomiting on 1/31 prompting NG tube placement. She has since had high gastric output requiring TPN for nutrition. On 2/12, she developed tachypnea and confusion. She had a worsening transaminitis. CT of the abdomen/pelvis revealed a hematoma compressing the portal vein and common bile duct. She was taken to IR where she underwent placement of a percutaneous biliary drain. She had to be intubated for the procedure due to her marginal respiratory status. She was admitted to the SICU service for close observation and management of sepsis.  She was  very septic that night and following day requiring a large IV fluid volume resuscitation and relatively high dose norepinephrine infusion.  She has been improving.    Interval history:  No significant new problems noted overnight.     Objective Section:  BP: (84-159)/(59-79)   Temp:  [36.3 C (97.3 F)-36.6 C (97.9 F)]   Temp src: Temporal (02/15 1200)  Heart Rate:  [73-100]   Resp:  [11-24]   SpO2:  [85 %-99 %]     FiO2: 50 % (04/19/17 1400)        Intake/Output last 3 shifts:  I/O last 3 completed shifts:  02/14 1500 - 02/15 1459  In: 3402.7 (46.3 mL/kg) [I.V.:172.3 (0.1 mL/kg/hr); Blood:100; Other:10; NG/GT:560; IV Piggyback:550]  Out: 3321 (45.2 mL/kg) [Urine:2341 (1.3 mL/kg/hr); Emesis/NG output:980]  Net: 81.7  Weight: 73.5 kg     Intake/Output this shift:  No intake/output data recorded.      Vent settings for last 24 hours:  Ventilator: Drager  MODE: CMV+  Ventilator On: Yes  FiO2: 50 %  AutoFlow: On  S RR: 14  Vt: 450 mL  PEEP: 8 cm H20  IT: 0.85 sec  Rise Time/Slope: 0.2  Sensitivity: 2  A RR: 19  PIP: 19 cm H2O  MAP: 10 cmH2O  MV: 8.1 l/min  A VT: 678 mL  Vt exp: 681 mL    Physical exam:      General: The patient is on the ventilator and is in no apparent distress.  She is sedated.  She awakens easily.  She is interactive and appropriate.    Pulmonary: Symmetric breath sounds, essentially clear bilaterally.     Cardiac: Regular rate and rhythm.  Abdominal: Absent bowel sounds, softly distended.        Extremities: Warm, pink, 2-3+ UE edema, 1-2+ LE edema.      Labs:  Lab Results   Component Value Date    WBC 22.7 (H) 04/19/2017    HCT 22 (L) 04/19/2017    PLT 298 04/19/2017     Lab Results   Component Value Date    NA 135 04/19/2017    K 3.9 04/19/2017    CL 99 04/19/2017    CO2 23 04/19/2017    UN 22 (H) 04/19/2017    CREAT 0.80 04/19/2017    WBGLU 160 (H) 04/19/2017    PGLU 150 (H) 04/19/2017     Lab Results   Component Value Date    CA 8.9 04/19/2017    MG 2.2 04/19/2017    PO4 3.1 04/19/2017      Lab Results   Component Value Date    PTI 13.2 (H) 04/19/2017    INR 1.2 (H) 04/19/2017    PTT 29.8 04/19/2017     Lab Results   Component Value Date    ALT 305 (H) 04/19/2017    AST 204 (H) 04/19/2017    ALK 314 (H) 04/19/2017     Bilirubin,Total   Date Value Ref Range Status   04/19/2017 1.5 (H) 0.0 - 1.2 mg/dL Final           Lab results: 04/19/17  1139  04/16/17  1718   Lactate ART,WB 1.1*  < >  --    Lactate VEN,WB  --   --  4.6*   < > = values in this interval not displayed.           Currently Active/Followed Hospital Problems:  Active Hospital Problems    Diagnosis    *!*Pancreatic adenocarcinoma s/p Whipple 03/29/17     -Whipple on 1/25 with Dr. Ron Agee  [facility] course c/b gastric outlet obstruction d/t severe compression of the main portal vein and common bile duct by hematoma, PTC drain 2/12 in IR  -Started on Zosyn 2/12  -Continue Decadron  -Continue TPN with SSI for glucose control        Septic shock     - Occurring in setting of biliary obstruction s/p PTC external/internal placement for source control  - Blood cultures with NGTD, on Zosyn (2/12-) and Vanco (2/12-)  - On/Off levo gtt   - Goal SBP >90, MAP >60   - UOP 1.8L (1.8, 2.1, 0.9)      Acute respiratory failure     - Intubated 2/12 prior to [facility] placement for increased WOB  - Weaning ventilator as tolerated  - CXR 2/14 worsening bilateral pleural effusion,   - Fluid status: Net +15L, 24hr +2.4L        hx of Pulmonary embolism     - PE 01/18/17 on CT chest   - This hospitalization has been on therapeutic Lovenox, and Heparin gtt  - Currently all anticoagulation on hold in setting of expanding hematoma  - Will restart when surgeon finds acceptable      Acute blood loss anemia     - H/H 7.7/22  - 1 unit PRBC yesterday for H/H 6.2/21  - Will monitor and transfuse prn        Cancer associated pain     Methadone at home  Pain management guided by patient's primary pain physician Dr Irven [place]  - methadone at 5ng IV BID (09:00 & 21:00) and 7.5  mg  IV qDay @ 13:00  - Fenatnyl gtt while intubated and then would transition back to dilaudid PCA        Depression     -Continue Elavil and Lexapro      Hypothyroidism     -Synthroid            Attending summary impressions:     The patient has been critically ill with severe biliary sepsis due to obstruction of the afferent reux limb for a Whipple procedure.  She has been improving.   She has much improved hemodynamics.  She is off the norepinephrine infusion and is getting TPN and meds only for IV fluids.  Clinically, she is perfusing well.  A near normal lactate confirms this.  She is very fluid overloaded.  Will start a forced diuresis (furosemide infusion).   She has been on therapeutic anticoagulation for her PE history.  Due to recent bleeding at the resection site, will hold off on resuming anticoagulation.   She has ongoing acute respiratory failure.  She did not tolerate a spontaneous breathing (pressure support) trial at all yesterday.  She should do better with fluid removal with the forced diuresis.  Will hold off on a spontaneous breathing trial today.   She has much improved sepsis.  She is afebrile.  She has persistent leukocytosis, but her left shift is improving.  She has no positive cultures.  She has been treated with vancomycin and Zosyn.  Will stop the vancomycin and continue the Zosyn.   Her shock liver is improving with decreasing transaminases.   She has good renal function.  The patient has good to excellent urine output and normal renal function tests.    The patient has acceptable anemia and a stable hematocrit.  No transfusion is needed.    She has reasonable pain control with scheduled methadone, a fentanyl infusion, and prn hydromorphone.   She remains NPO.  She is getting TPN for nutrition.   Will work on increasing her activity.   She remains critically ill but is making steady progress.   Discussed with Dr. Dennard Schaumann.    Attending Attestation    This is a high  complexity patient.     '[X]'  This patient is critically ill with at least 1 organ system failure including sepsis, acute respiratory failure, shock liver, and anemia. The care I have delivered involved high complexity decision making to assess, manipulate, and support vital system function(s), to treat the vital organ system failure and/or prevent further deterioration of the patient's condition. All nursing documentation, laboratory data, test results, and radiographs were reviewed and interpreted by me. I have established the management plan for this patient's critical illness and have been immediately available to assist with patient care. My documented total critical care time reflects my own patient care time and does not include teaching or procedure time.     Critical care time: Personal time spent - (exclusive of performance of any procedures performed in the last 24 hours):  35 minutes    Signed by: Lorenza Chick, MD as of 04/19/2017 at 3:00 PM.

## 2017-04-19 NOTE — Progress Notes (Signed)
Referral for home care service has been received from [place] and We will resume care when the patient is discharged.  [facility] is aware the patient has a mediport and might be going home on TPN.  [facility] will continue to follow patient throughout hospital stay and plan for home care needs.  Comments:  Please notify me when the patient is closer to d/c so we can plan the 2 days of TPN teaching prior to discharge.Kathrynn Speed RN, [place]. [facility] is [facility] and following discharge plan.   Weekend phone number is [phone] or [phone]    Thank you

## 2017-04-19 NOTE — Consults (Addendum)
Nutrition Note: TPN check    Full follow up was completed on 2/14.  Record reviewed, including labs, I&Os and MAR.    Labs:   UN high, 22, other renal fx labs WNL  Whole blood Na+ low 131, serum Na+ WNL 135  Corrected ICA low 4.3; IV repletion given on 2/14 (multiple runs over past 3 days) per the [facility]  BGs range 180-195, insulin coverage and protocol is in place  LFTs high, some improvement in AST/ALT; t bili high 1.5, increased   TG high 223  Anemia, MCV WNL; anemia panel not checked. TPN does not provide iron, but pt has required transfusion support which provides iron. Last RBC transfusion was on 2/14 per I&Os.     Pt has received IV K+ daily over the past several days per the [facility].  She has not required Mg repletion since 2/11.  Last phos repletion was on 2/13.      Pt with 350 ml NG output and 425 ml biliary tube output (increased) on 2/14. Still no BMs.  Good UOP, IVF adequate to replete extraordinary losses.  Small negative fluid balance.      Estimated Nutrient Needs:   (Based on admit wt 72 kg)        1800 - 2160 kcal/d (25 - 30 kcal/kg)  110 - 145 g protein/d (1.5 - 2.0 g protein/kg)  1800 - 2160 mL fluid/d (25 - 30 mL/kg)    TPN is ordered for tonight as follows: 2-in-1 TPN with lipids, via adult central line; amino acid dextrose solution @ 75 ml/hr x 24 hrs (1800 ml fluid per day), with:   Amino acids 53 g/L  Dextrose 167 g/L   Na+ 96 meq/L  K+ 22 meq/L  Ca++ 4.5 meq/L  Mg 5 meq/L  P 12 mmole/L  Standard MVI  Standard trace elements  1 mg folic acid  454 mg thiamine  Chloride: Acetate ratio- 2:1 (two thirds chloride, one third acetate)  AND: 60 g/day lipids (300 ml/day 20% soy lipid emulsion, given over 12 hours)  TPN with lipids provides a total of 2100 ml fluid, 2004 kcal, 95 g protein and 301 g dextrose per day.  Changes were not made per RD recs. Spoke to covering provider, ordering NP, left message for TPN pharmacy requesting that changes be made per unit RD recs.     Recommendations:   1. TPN  Initiation  The Clinical Nutrition Specialist agrees TPN is appropriate for this patient. Most recently reviewed at [place] on 04/17/17.  Original Start Date: 2/5  D/C Plan:  TPN will be discontinued once GOO resolves     2.  For today, 04/19/17: Please change TPN order to 2-in-1 TPN with lipids, via adult central line; amino acid dextrose solution @ 75 ml/hr x 24 hrs (1800 ml fluid per day), with: (changes in bold)  Amino acids 70 g/L  Dextrose 150 g/L   Na+ 100 meq/L  K+ 30 meq/L  Ca++ 9 meq/L  Mg 5 meq/L  P 12 mmole/L  Standard MVI  Standard trace elements  D/C-1 mg folic acid  U/J-811 mg thiamine  Chloride: Acetate ratio- 2:1 (two thirds chloride, one third acetate) ADDENDUM: system will not accept 2:1 chloride: acetate ration, changed to max chloride.  AND: 60 g/day lipids (300 ml/day 20% soy lipid emulsion, given over 12 hours)  TPN with lipids will provide a total of 2100 ml fluid, 2022 kcal, 126 g protein and 270 g dextrose/day.    -  new order reduces dextrose by 30 g/day        3.  Please check free and total carnitine levels.  If low, add 250 mg/day levocarnitine to TPN.     4.  Please continue parenteral monitoring panel. Recommended Monitoring Parameters:    PO4, Mg, and basic metabolic panel daily for 3 days or until stable, then 3 times per week. Replete lytes via IV as needed.     Comprehensive metabolic panel and triglycerides weekly. Hold lipids if TG>400.     Check BGs q 6 hours until stable on goal dextrose.     Monitor weight daily until fluid balance is stable, then weekly.    Accurate I&Os.     5.  If LFTs do not continue to improve, pt may benefit from changing to SMOF lipids.  Will re-evaluate on Monday 2/18.      6.  Over the weekend, the covering ICU RD can be contacted via the page office (day shifts) if any adjustments are needed to TPN.     Pt is followed per high nutritional risk level protocol.   Serita Butcher, RD, [place] pager# [phone]

## 2017-04-19 NOTE — Progress Notes (Addendum)
Surgical Oncology/Hepatobiliary Surgery Progress Note     LOS: 21 days     Subjective:    Acute episode of hypoxia during repositioning yesterday evening.  CXR with increased effusions and increased L atelectasis.  Overall, on decreased vent settings with PEEP 8 and FiO2 50%.  Great UOP.  ALT/AST/AlkPhos improving. Total bili increased to 1.5 (9).  No boluses or pressors overnight.    Objective:    Vitals Sign Ranges for Past 24 Hours:  BP: (82-186)/(53-144)   Temp:  [36.2 C (97.2 F)-36.6 C (97.9 F)]   Temp src: Temporal (02/15 0800)  Heart Rate:  [73-100]   Resp:  [11-41]   SpO2:  [85 %-99 %]       Physical Exam:    General Appearance: intubated, following commands  HEENT: NGT with brown gastric fluid output  Cardiac: regular rate  Respiratory: mechanically ventilated  Abdomen: edematous, no apparent tenderness to palpation, biliary drain with bilious putut  Extremities: warm    Labs:    CBC:    Recent Labs  Lab 04/19/17  0551 04/19/17  0017  04/17/17  2344  04/17/17  0044 04/16/17  2201  04/16/17  1718 04/16/17  1119 04/16/17  0100   WBC  --  22.7*  --  21.2*  --  9.9  --   --  2.9* 7.4 9.9   Hemoglobin 8.0* 7.7*   7.9*  < > 6.9*  < > 6.8*   7.3* 5.8*  < > 7.2*   7.7* 7.5* 6.7*   Hematocrit  --  22*  --  21*  --  21* 18*  --  23* 24* 21*   Platelets  --  298  --  384*  --  309  --   --  413* 501* 413*   < > = values in this interval not displayed.    Metabolic Panel:    Recent Labs  Lab 04/19/17  0017 04/17/17  2344 04/17/17  1807 04/17/17  1048 04/17/17  0044 04/16/17  1718 04/16/17  0831 04/16/17  0100 04/15/17  0025   Sodium 135 132*  --  133 133 131* 131* 131* 132*   Potassium 3.9 4.0  --  3.7 3.8 3.6 3.9 4.0 3.7   Chloride 99 97  --  96 97 96 95* 95* 97   CO2 23 22  --  21 21 19* '22 24 25   ' UN 22* 22*  --  22* '20 20 20 ' 21* 23*   Creatinine 0.80 0.83  --  0.96* 0.95 0.73 0.74 0.74 0.70   Glucose 187* 224*  --  179* 74 184* 144* 146* 132*   Calcium 8.9 7.8*  --  7.9* 7.9* 7.5* 8.2* 8.1* 8.1*   Magnesium 2.2  1.9  --   --  1.7 1.6  --  1.9 1.9   Phosphorus 3.1 3.1 2.7  --  1.2*  --   --  2.9 3.7          Assessment:    [name]  status post whipple procedure complicated by delayed gastric emptying.  NGT replaced on 04/04/17. UGI on 04/12/17 concerning for obstruction just beyond the [place] in the efferent limb.  Course complicated on 9/56/38 by rising LFTs and sepsis with CT concerning for biliary tree obstruction and PV compression due to hematoma and afferent limb distention in setting of PE treatment. S/p PTC 04/16/17. She had a  planned intubation on 04/16/17 prior to IR procedure due to respiratory insufficiency with tachypnea but O2 Sats table on 2L NC, but post-IR did have hypoxia.    Total bilirubin 1.5 (0.9). WBC 22.7 with 1% bands (21.2 with 22% bands). Lactate 1.7.    Plan:    - NPO, IVF, NGT to LIWS  - PTC/biliary drain to gravity; flush daily towards patient (we flushed it this am already)  - IV vancomycin/Zosyn for cholangitis  - Hold therapeutic anticoagulation  - Wean ventilator as tolerated  - EGD by GI when clinically improved  - Appreciate SICU care      Amanda Duff, MD  04/19/2017     10:02 AM  General Surgery Resident    HPB and GI Surgery Attending:    I saw and evaluated the patient. I have reviewed and edited the resident's/fellow's note and confirm the findings and plan of care as documented above. Improvement. Tolerating diuresis.    Would not fully anticoagulate!    Prophylactic AC OK.    Amanda Eke, MD

## 2017-04-19 NOTE — Progress Notes (Signed)
SICU Progress Note    Amanda Cullenis a 72 y.o.female with PMHx PE,pancreatic adenocarcinoma s/p uncomplicated Whipple 5/18 with Dr Ron Agee. Briefly admitted to SICU post-operatively for pain control, on home methadone for chronic cancer pain. Discharged to the floor 1/28. Developed n/v beginning 1/31, NGT placed with continued high output and concern for gastric outlet obstruction distal to the gastro-jejunostomy site. On 2/12 developed tachypnea, mental status changes, and worsening transaminitis. CT ABD/pelvis demonstrated a hematoma causing severe compression of the main portal vein and common bile duct. Decision was made to place an IR percutaneous bilary drain. Prior to placement patient had increased WOB and was intubated for the procedure. She was admitted to SICU on 2/12 post-procedure for close observation and sepsis management. She had septic shock that night and following day requiring large volume IVF resuscitation and norepinephrine infusion.     Interval History: Overnight SpO2 82% on 50%, quickly recovered with 100% FiO2. Currently, SpO2 96% on 50% FiO2. CXR showed worsening bilateral pleural effusions.     Past Med/Sx History:   Past Medical History:   Diagnosis Date    Acute kidney failure 03/31/2017    Cancer     Depression     Fibromyalgia     Hypothyroidism        Physical Exam by Systems:  See attending physician physical exam     Assessment and Plan for Active/Followed Hospital Problems:   Active Hospital Problems    Diagnosis    *!*Pancreatic adenocarcinoma s/p Whipple 03/29/17     -Whipple on 1/25 with Dr. Ron Agee  [facility] course c/b gastric outlet obstruction d/t severe compression of the main portal vein and common bile duct by hematoma, PTC drain 2/12 in IR  -Started on Zosyn 2/12  -Continue Decadron  -Continue TPN with SSI for glucose control        Sepsis     - Occurring in setting of biliary obstruction s/p PTC external/internal placement for source control  - BCx NGTD,  Zosyn (2/12-) Vanco (2/12-)  - On/Off levo gtt   - Goal SBP >90, MAP >60   - UOP 1.8L (1.8, 2.1, 0.9)      Acute respiratory failure     - Intubated 2/12 prior to [facility] placement for increased WOB  - Weaning ventilator as tolerated  - CXR 2/14 worsening bilateral pleural effusion,   - Fluid status: Net +15L, 24hr +2.4L   - 2/15 start lasix gtt  - K 3.9, Cr 0.8       hx of Pulmonary embolism     - PE 01/18/17 on CT chest   - This hospitalization has been on therapeutic Lovenox, and Heparin gtt  - Currently all anticoagulation on hold in setting of expanding hematoma  - Will restart when surgeon finds acceptable      Acute blood loss anemia     - H/H 7.7/22  - 1 unit PRBC yesterday for H/H 6.2/21  - Will monitor and transfuse prn        Cancer associated pain     Methadone at home  Pain management guided by patient's primary pain physician Dr Irven [place]  - methadone at 66m IV BID (09:00 & 21:00) and 7.5 mg IV qDay @ 13:00  - Fenatnyl gtt while intubated and then would transition back to dilaudid PCA  - 2/15 increase methadone to 7.579mand 1059m- QTc 450, monitor for prolongation  - wean Fentanyl gtt        Depression     -  Continue Elavil and Lexapro      Hypothyroidism     -Synthroid          Diagnoses for this hospitalization include:  Acute Respiratory Failure, Septic Shock and Shock  Septic       ---SICU Critical Care  Bundle reviewed with bedside nursing    Author: Greg Cutter, MD  as of: 04/19/2017  at: 8:02 AM

## 2017-04-19 NOTE — Progress Notes (Signed)
Report Given To  Phil RN      Descriptive Sentence / Reason for Admission   PMHx: pancreatic adenocarcinoma    1/25 s/p open Whipple. She is being admitted to SICU for post-operative monitoring, notably hemodynamic status and post-op pain control.      Active Issues / Relevant Events   2/8: upper GI series done  2/12: Continued confusion/pain on CC5, blood/urine culture sent. CT abd done; hematoma near common bile duct found; IR for biliary drain. Intubated for inc RR. Transferred to [address]00. 2L IVF for HoTn.   2/13N: 5.5 L for HoTN and Levo; A-line and CVC placed. DAYS: ECHO (bilateral pleural effusions), IV methadone restarted  2/14N: 2.5L p-lyte, levo off DAYS: PEEP weaned throughout the day, 1 uPRBC for H/H 6.9/21; MIVF d/c'd; Fi02 40% --> 100% d/t acute episode of hypoxia during repositioning in the evening.  2/15N: CXR done. Vent weaned from 100% at start of shift. Mediport de-accessed.  2/15D Methadone dose increased (plan to wean fentanyl). CXR showing worsening L pleural effusion. Lasix gtt initiated. VSS. Blood cultures positive for Klebsiella enterobacter.       To Do List   Goal: SBP > 90 or MAP > 60   PT consult   Q6H ABG with panel; Wean FiO2 for goal PO2 60-80   Flush biliary drain BID w/ 5cc NS (2000 and 0800)    Please remember to complete care plan and education record at 0400 and 1600 hours      Anticipatory Guidance / Discharge Planning  [facility] Prevention: ETT, NGT, LIJ CVC, L brachial a-line, Mediport, PIV x2, biliary drain, Foley     Daughter updated at bedside 2/14.

## 2017-04-19 NOTE — Progress Notes (Signed)
Report Given To        Descriptive Sentence / Reason for Admission   PMHx: pancreatic adenocarcinoma    1/25 s/p open Whipple. She is being admitted to SICU for post-operative monitoring, notably hemodynamic status and post-op pain control.      Active Issues / Relevant Events   2/8: upper GI series done  2/12: Continued confusion/pain on CC5, blood/urine culture sent. CT abd done; hematoma near common bile duct found; IR for biliary drain. Intubated for inc RR. Transferred to 83600. 2L IVF for HoTn.   2/13N: 5.5 L for HoTN and Levo; A-line and CVC placed. DAYS: ECHO (bilateral pleural effusions), IV methadone restarted  2/14N: 2.5L p-lyte, levo off DAYS: PEEP weaned throughout the day, 1 uPRBC for H/H 6.9/21; MIVF d/c'd; Fi02 40% --> 100% d/t acute episode of hypoxia during repositioning in the evening.  2/15N: CXR done. Vent weaned from 100% at start of shift. Mediport de-accessed.  2/15D Methadone dose increased (plan to wean fentanyl). CXR showing worsening L pleural effusion. Lasix gtt initiated. VSS. Blood cultures positive for Klebsiella enterobacter.       To Do List   Goal: SBP > 90 or MAP > 60   PT consult   Q6H ABG with panel; Wean FiO2 for goal PO2 60-80   Flush biliary drain BID w/ 5cc NS (2000 and 0800)    Please remember to complete care plan and education record at 0400 and 1600 hours      Anticipatory Guidance / Discharge Planning  [facility] Prevention: ETT, NGT, LIJ CVC, L brachial a-line, Mediport, PIV x2, biliary drain, Foley     Daughter updated at bedside 2/14.

## 2017-04-19 NOTE — Progress Notes (Addendum)
---  Critical Care Bundle--    Fluids & Electrolytes: Lasix gtt at 4mg /hr, Free water flushes 60cc--change BMP to BID?    Nutrition / last BM: TPN/Lipids    Mobility plan: Bedrest at present--PT consult?    Neuro / sedation / pain / sedation holiday: Fentanyl gtt, dilaudid PRN, scheduled methadone    Respiratory weaning (and PST, TC) / pulmonary toilet: 50% P 8     Delirium / sleep / prevention / intervention: CAM +, anxious/agitation     List of antimicrobials: Vancomycin, Zosyn    Drains / Lines / Tubes: ETT, NG, Art line, TLIC, medport is deaccessed, 2 PIVs, biliary drain, Foley    Indication for foley: Strict I/o     DVT prophylaxis / a PTT: SCDs last pTT 29.8    Indications for gastric acid suppression: Protonix    Labs ordered:    Q6h ABG w/ whole blood panel  Daily CBC diff, Comp, Mag, Phos, pTT, PT/INR  T/Th/S Triglycerides  PRN ABGs, H/H    Family concern / updates: Daughter updated last night     Patient likes to be called: Amanda Galloway    Code status: Full    Disposition / Level: ICU    Nursing reiterate plan for day: Y/N          Author: Larina Earthly, RN  as of: 04/19/2017  at: 9:28 AM

## 2017-04-20 LAB — ART GASES / WHOLE BLOOD PANEL
Base Excess, Arterial: 8 mmol/L — ABNORMAL HIGH (ref <=2)
Base Excess, Arterial: 7 mmol/L — ABNORMAL HIGH (ref <=2)
Base Excess, Arterial: 5 mmol/L — ABNORMAL HIGH (ref <=2)
CO2,ART (Calc): 33 mmol/L — ABNORMAL HIGH (ref 23–28)
CO2,ART (Calc): 33 mmol/L — ABNORMAL HIGH (ref 23–28)
CO2,ART (Calc): 30 mmol/L — ABNORMAL HIGH (ref 23–28)
CO: 0.4 %
CO: 0.4 %
CO: 0.3 %
FO2 Hb, Arterial: 93 % (ref 90–100)
FO2 Hb, Arterial: 93 % (ref 90–100)
FO2 Hb, Arterial: 92 % (ref 90–100)
Glucose,WB: 123 mg/dL — ABNORMAL HIGH (ref 60–99)
Glucose,WB: 112 mg/dL — ABNORMAL HIGH (ref 60–99)
Glucose,WB: 134 mg/dL — ABNORMAL HIGH (ref 60–99)
HCO3, Arterial: 31 mmol/L — ABNORMAL HIGH (ref 21–26)
HCO3, Arterial: 31 mmol/L — ABNORMAL HIGH (ref 21–26)
HCO3, Arterial: 29 mmol/L — ABNORMAL HIGH (ref 21–26)
Hemoglobin: 9.3 g/dL — ABNORMAL LOW (ref 11.2–15.7)
Hemoglobin: 9.8 g/dL — ABNORMAL LOW (ref 11.2–15.7)
Hemoglobin: 9.4 g/dL — ABNORMAL LOW (ref 11.2–15.7)
ICA @7.4,WB: 4.4 mg/dL — ABNORMAL LOW (ref 4.8–5.2)
ICA @7.4,WB: 4.9 mg/dL (ref 4.8–5.2)
ICA @7.4,WB: 4.8 mg/dL (ref 4.8–5.2)
ICA Uncorr,WB: 4.2 mg/dL
ICA Uncorr,WB: 4.7 mg/dL
ICA Uncorr,WB: 4.7 mg/dL
Lactate ART,WB: 1.9 mmol/L — ABNORMAL HIGH (ref 0.3–0.8)
Lactate ART,WB: 1 mmol/L — ABNORMAL HIGH (ref 0.3–0.8)
Lactate ART,WB: 1.1 mmol/L — ABNORMAL HIGH (ref 0.3–0.8)
Methemoglobin: 0.3 % (ref 0.0–1.0)
Methemoglobin: 0.2 % (ref 0.0–1.0)
Methemoglobin: 0.7 % (ref 0.0–1.0)
NA, WB: 134 mmol/L — ABNORMAL LOW (ref 135–145)
NA, WB: 136 mmol/L (ref 135–145)
NA, WB: 134 mmol/L — ABNORMAL LOW (ref 135–145)
Potassium,WB: 3.8 mmol/L (ref 3.4–4.7)
Potassium,WB: 3.4 mmol/L (ref 3.4–4.7)
Potassium,WB: 3.8 mmol/L (ref 3.4–4.7)
pCO2, Arterial: 40 mm Hg (ref 35–46)
pCO2, Arterial: 42 mm Hg (ref 35–46)
pCO2, Arterial: 38 mm Hg (ref 35–46)
pH: 7.51 — ABNORMAL HIGH (ref 7.35–7.45)
pH: 7.49 — ABNORMAL HIGH (ref 7.35–7.45)
pH: 7.5 — ABNORMAL HIGH (ref 7.35–7.45)
pO2,Arterial: 67 mm Hg — ABNORMAL LOW (ref 80–100)
pO2,Arterial: 68 mm Hg — ABNORMAL LOW (ref 80–100)
pO2,Arterial: 66 mm Hg — ABNORMAL LOW (ref 80–100)

## 2017-04-20 LAB — CBC AND DIFFERENTIAL
Baso # K/uL: 0 10*3/uL (ref 0.0–0.1)
Basophil %: 0 %
Eos # K/uL: 0.2 10*3/uL (ref 0.0–0.4)
Eosinophil %: 0.9 %
Hematocrit: 24 % — ABNORMAL LOW (ref 34–45)
Hemoglobin: 8.2 g/dL — ABNORMAL LOW (ref 11.2–15.7)
Lymph # K/uL: 0.2 10*3/uL — ABNORMAL LOW (ref 1.2–3.7)
Lymphocyte %: 0.9 %
MCH: 30 pg/cell (ref 26–32)
MCHC: 35 g/dL (ref 32–36)
MCV: 86 fL (ref 79–95)
Mono # K/uL: 0.8 10*3/uL (ref 0.2–0.9)
Monocyte %: 3.5 %
Neut # K/uL: 20.8 10*3/uL — ABNORMAL HIGH (ref 1.6–6.1)
Nucl RBC # K/uL: 0 10*3/uL (ref 0.0–0.0)
Nucl RBC %: 0.1 /100 WBC (ref 0.0–0.2)
Platelets: 335 10*3/uL (ref 160–370)
RBC: 2.8 MIL/uL — ABNORMAL LOW (ref 3.9–5.2)
RDW: 18.3 % — ABNORMAL HIGH (ref 11.7–14.4)
Seg Neut %: 93.8 %
WBC: 22.1 10*3/uL — ABNORMAL HIGH (ref 4.0–10.0)

## 2017-04-20 LAB — BASIC METABOLIC PANEL
Anion Gap: 11 (ref 7–16)
CO2: 30 mmol/L — ABNORMAL HIGH (ref 20–28)
Calcium: 9.1 mg/dL (ref 8.6–10.2)
Chloride: 96 mmol/L (ref 96–108)
Creatinine: 1 mg/dL — ABNORMAL HIGH (ref 0.51–0.95)
GFR,Black: 66 *
GFR,Caucasian: 57 * — AB
Glucose: 111 mg/dL — ABNORMAL HIGH (ref 60–99)
Lab: 28 mg/dL — ABNORMAL HIGH (ref 6–20)
Potassium: 3.7 mmol/L (ref 3.3–5.1)
Sodium: 137 mmol/L (ref 133–145)

## 2017-04-20 LAB — TRIGLYCERIDES: Triglycerides: 183 mg/dL — AB

## 2017-04-20 LAB — DIFF MANUAL
Diff Based On: 114 CELLS
Myelocyte %: 1 % — ABNORMAL HIGH (ref 0–0)

## 2017-04-20 LAB — COMPREHENSIVE METABOLIC PANEL
ALT: 206 U/L — ABNORMAL HIGH (ref 0–35)
AST: 83 U/L — ABNORMAL HIGH (ref 0–35)
Albumin: 3 g/dL — ABNORMAL LOW (ref 3.5–5.2)
Alk Phos: 289 U/L — ABNORMAL HIGH (ref 35–105)
Anion Gap: 14 (ref 7–16)
Bilirubin,Total: 0.7 mg/dL (ref 0.0–1.2)
CO2: 27 mmol/L (ref 20–28)
Calcium: 8.8 mg/dL (ref 8.6–10.2)
Chloride: 97 mmol/L (ref 96–108)
Creatinine: 0.85 mg/dL (ref 0.51–0.95)
GFR,Black: 80 *
GFR,Caucasian: 69 *
Glucose: 135 mg/dL — ABNORMAL HIGH (ref 60–99)
Lab: 24 mg/dL — ABNORMAL HIGH (ref 6–20)
Potassium: 3.4 mmol/L (ref 3.3–5.1)
Sodium: 138 mmol/L (ref 133–145)
Total Protein: 5.5 g/dL — ABNORMAL LOW (ref 6.3–7.7)

## 2017-04-20 LAB — APTT: aPTT: 26.9 s (ref 25.8–37.9)

## 2017-04-20 LAB — POCT GLUCOSE
Glucose POCT: 121 mg/dL — ABNORMAL HIGH (ref 60–99)
Glucose POCT: 103 mg/dL — ABNORMAL HIGH (ref 60–99)
Glucose POCT: 136 mg/dL — ABNORMAL HIGH (ref 60–99)

## 2017-04-20 LAB — PHOSPHORUS: Phosphorus: 3 mg/dL (ref 2.7–4.5)

## 2017-04-20 LAB — MAGNESIUM: Magnesium: 1.6 mg/dL (ref 1.6–2.5)

## 2017-04-20 LAB — PROTIME-INR
INR: 1.1 (ref 0.9–1.1)
Protime: 12.8 s (ref 10.0–12.9)

## 2017-04-20 LAB — RBC MORPHOLOGY

## 2017-04-20 MED ORDER — HEPARIN SODIUM 5000 UNIT/ML SQ *I*
5000.0000 [IU] | Freq: Three times a day (TID) | SUBCUTANEOUS | Status: DC
Start: 2017-04-20 — End: 2017-05-09
  Administered 2017-04-20 – 2017-05-09 (×57): 5000 [IU] via SUBCUTANEOUS
  Filled 2017-04-20 (×58): qty 1

## 2017-04-20 MED ORDER — POTASSIUM CHLORIDE 20 MEQ/50ML IV SOLN *I*
20.0000 meq | Freq: Once | INTRAVENOUS | Status: AC
Start: 2017-04-20 — End: 2017-04-20
  Administered 2017-04-20: 20 meq via INTRAVENOUS
  Filled 2017-04-20: qty 50

## 2017-04-20 MED ORDER — FAT EMULSION SOYBEAN OIL 20 % IV EMUL *WRAPPED*
60.0000 g | Freq: Every day | INTRAVENOUS | Status: AC
Start: 2017-04-20 — End: 2017-04-21
  Administered 2017-04-20 – 2017-04-21 (×11): 60 g via INTRAVENOUS
  Filled 2017-04-20: qty 300

## 2017-04-20 MED ORDER — ACETAZOLAMIDE SODIUM 500 MG IJ SOLR *I*
250.0000 mg | Freq: Four times a day (QID) | INTRAMUSCULAR | Status: AC
Start: 2017-04-20 — End: 2017-04-20
  Administered 2017-04-20 (×3): 250 mg via INTRAVENOUS
  Filled 2017-04-20 (×3): qty 2.5

## 2017-04-20 MED ORDER — STERILE WATER FOR INJECTION (TPN USE ONLY) *I*
INTRAVENOUS | Status: AC
Start: 2017-04-20 — End: 2017-04-21
  Filled 2017-04-20: qty 840

## 2017-04-20 NOTE — Progress Notes (Signed)
Critical Care Medicine Attending Note:      If there are key historical elements or objective findings that I think deserve particular emphasis, I have recorded them below. A summary of key elements of our assessment and plans is listed in the resident / medical student / PA / NP's note. Please refer to this note for complete details of our mutually agreed upon findings, assessment, and recommendations.  The problem list reflects my input.    Summary of history of present illness:  The patient is a 72 year old woman with a medical history significant for hypertension, pulmonary embolism (diagnosed in November 2018, on Lovenox at home), fibromyalgia, gastroesophageal reflux disease, hypothyroidism, and anxiety/depression. 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 She had a cholecystectomy that failed to relieve her symptoms. She has completed neoadjuvant chemotherapy and radiation therapy. She has required methadone for chronic abdominal pain. On 1/25,she underwent a Whipple procedure. The surgical procedure and anesthetic were uncomplicated. She was admitted to the SICU service for ongoing management and the expectation of complicated pain management. She did well and was discharged to the floor on 1/28. She developed nausea and vomiting on 1/31 prompting NG tube placement. She has since had high gastric output requiring TPN for nutrition. On 2/12, she developed tachypnea and confusion. She had a worsening transaminitis. CT of the abdomen/pelvis revealed a hematoma compressing the portal vein and common bile duct. She was taken to IR where she underwent placement of a percutaneous biliary drain. She had to be intubated for the procedure due to her marginal respiratory status. She was admitted to the SICU service for close observation and management of sepsis. She was  very septic that night and following dayrequiring a large IV fluid volume resuscitation and relatively high dose norepinephrine infusion. She has been improving.  She has been tolerating a forced diuresis.    Interval history:  No significant new problems noted overnight.     Objective Section:  BP: (89-123)/(53-89)   Temp:  [36.3 C (97.3 F)-36.8 C (98.2 F)]   Temp src: Temporal (02/16 0800)  Heart Rate:  [74-94]   Resp:  [12-28]   SpO2:  [93 %-99 %]     FiO2: 50 % (04/20/17 0900)        Intake/Output last 3 shifts:  I/O last 3 completed shifts:  02/15 0700 - 02/16 0659  In: 2922 (39.8 mL/kg) [I.V.:189.9 (0.1 mL/kg/hr); Other:10; NG/GT:370; IV Piggyback:250]  Out: 10587 (144.1 mL/kg) [Urine:9772 (5.5 mL/kg/hr); Emesis/NG output:815]  Net: -7665  Weight: 73.5 kg     Intake/Output this shift:  I/O this shift:  02/16 0700 - 02/16 1459  In: 239.5 (3.3 mL/kg) [I.V.:9.5; NG/GT:30; IV Piggyback:50]  Out: 1340 (18.2 mL/kg) [Urine:1260; Emesis/NG output:80]  Net: -1100.5  Weight: 73.5 kg       Vent settings for last 24 hours:  Ventilator: Drager  MODE: PS  Ventilator On: Yes  FiO2: 50 %  AutoFlow: On  S RR: 14  Vt: 450 mL  PEEP: 8 cm H20  PS: 8 cm H2O  IT: 0.85 sec  Rise Time/Slope: 0.2  Sensitivity: 2  A RR: 26  PIP: 17 cm H2O  MAP: 10 cmH2O  MV: 10.9 l/min  A VT: 427 mL  Vt exp: 430 mL  A VT: 423 mL    Physical exam:      General: The patient  is on the ventilator and is in no apparent distress.  The patient is awake, alert, interactive, and appropriate.     Pulmonary: Symmetric breath sounds, essentially clear bilaterally.     Cardiac: Regular rate and rhythm.     Abdominal: Absent bowel sounds, softly distended.  The midline incision is healing well.      Extremities: Warm, pink, minimal to 1+ edema.      Labs:  Lab Results   Component Value Date    WBC 22.1 (H) 04/19/2017    HCT 24 (L) 04/19/2017    PLT 335 04/19/2017     Lab Results   Component Value Date    NA 138 04/19/2017    K 3.4 04/19/2017    CL 97  04/19/2017    CO2 27 04/19/2017    UN 24 (H) 04/19/2017    CREAT 0.85 04/19/2017    WBGLU 123 (H) 04/20/2017    PGLU 121 (H) 04/20/2017     Lab Results   Component Value Date    CA 8.8 04/19/2017    MG 1.6 04/19/2017    PO4 3.0 04/19/2017     Lab Results   Component Value Date    PTI 12.8 04/19/2017    INR 1.1 04/19/2017    PTT 26.9 04/19/2017     Lab Results   Component Value Date    ALT 206 (H) 04/19/2017    AST 83 (H) 04/19/2017    ALK 289 (H) 04/19/2017     Bilirubin,Total   Date Value Ref Range Status   04/19/2017 0.7 0.0 - 1.2 mg/dL Final           Lab results: 04/20/17  0528  04/16/17  1718   Lactate ART,WB 1.9*  < >  --    Lactate VEN,WB  --   --  4.6*   < > = values in this interval not displayed.           Currently Active/Followed Hospital Problems:  Active Hospital Problems    Diagnosis    *!*Pancreatic adenocarcinoma s/p Whipple 03/29/17     -Whipple on 1/25 with Dr. Ron Agee  [facility] course c/b gastric outlet obstruction d/t severe compression of the main portal vein and common bile duct by hematoma, PTC drain 2/12 in IR  -Started on Zosyn 2/12  -Continue Decadron  -Continue TPN with SSI for glucose control        Sepsis     - Occurring in setting of biliary obstruction s/p PTC external/internal placement for source control  - BCx NGTD, Zosyn (2/12-) Vanco (2/12-2/14)  - Levo off   - Goal SBP >90, MAP >60   - Tolerating diuresis      Acute respiratory failure     - Intubated 2/12 prior to [facility] placement for increased WOB  - Weaning ventilator as tolerated  - CXR 2/14 worsening bilateral pleural effusion,   - 2/15 Lasix gtt   - net neg 7.7L last 24 hours       hx of Pulmonary embolism     - PE 01/18/17 on CT chest   - This hospitalization has been on therapeutic Lovenox, and Heparin gtt  - Currently holding therapeutic anticoagulation in setting of hematoma  - Heparin SQ BID      Acute blood loss anemia     - H/H 8.2/24, stable, no indication for transfusion  - Last transfusion 2/14  - Will monitor  and transfuse prn  Cancer associated pain     Methadone at home  Pain management guided by patient's primary pain physician Dr Irven [place]  - methadone at 51m IV BID (09:00 & 21:00) and 7.5 mg IV qDay @ 13:00  - Fenatnyl gtt while intubated and then would transition back to dilaudid PCA  - 2/15 increase methadone to 7.547mand 1032m- QTc 450, monitor for prolongation  - wean Fentanyl gtt        Depression     -Continue Elavil and Lexapro      Hypothyroidism     -Synthroid            Attending summary impressions:     The patient has been critically ill with biliary sepsis due to obstructed afferent reux limb obstruction for a Whipple procedure.  She has been improving.   The patient has acceptable hemodynamics and is clinically perfusing well.   She has been tolerating a very large negative fluid balance (7 liters in the past 24 hours).  Her edema is much improved but is still significant.  Will continue the forced diuresis.   She has a history of pulmonary embolism.  Anticoagulation on hold due to recent bleeding.   She is on DVT prophylaxis.   She has improving acute respiratory failure with decreasing PEEP requirement.  She is tolerating a spontaneous breathing (pressure support) trial this morning.   We may be able to extubate her in next 1-2 days.   She has much improved sepsis.  She is afebrile.  She has a stable leukocytosis.  Blood culture from the IVAD grew Klebsiella.  She remains on Zosyn.  Planning on a 7 course.  Will discuss with the surgical team.   She has improving transaminases/liver function tests consistent with improving shock liver.   Her NG came out but her NG output has been much reduced.  Will discuss replacing it with the surgical team.   She has had an excellent diuresis.  Her renal function tests are normal.   The patient has a metabolic alkalosis and elevated pH (alkalemia) due to the forced diuresis.  Treating with acetazolamide.    The patient has acceptable anemia and  a stable hematocrit.  No transfusion is needed.    She remains NPO.  She continues to get TPN.   She is critically ill but is making good progress.    Attending Attestation    This is a high complexity patient.     '[X]'  This patient is critically ill with at least 1 organ system failure including sepsis, acute respiratory failure, shock liver, and anemia. The care I have delivered involved high complexity decision making to assess, manipulate, and support vital system function(s), to treat the vital organ system failure and/or prevent further deterioration of the patient's condition. All nursing documentation, laboratory data, test results, and radiographs were reviewed and interpreted by me. I have established the management plan for this patient's critical illness and have been immediately available to assist with patient care. My documented total critical care time reflects my own patient care time and does not include teaching or procedure time.     Critical care time: Personal time spent - (exclusive of performance of any procedures performed in the last 24 hours):  35 minutes    Signed by: JOSLorenza ChickD as of 04/20/2017 at 10:01 AM.

## 2017-04-20 NOTE — Progress Notes (Signed)
Report Given To  Jasmina RN      Descriptive Sentence / Reason for Admission   PMHx: pancreatic adenocarcinoma    1/25 s/p open Whipple. She is being admitted to SICU for post-operative monitoring, notably hemodynamic status and post-op pain control.      Active Issues / Relevant Events   2/8: upper GI series done  2/12: Continued confusion/pain on CC5, blood/urine culture sent. CT abd done; hematoma near common bile duct found; IR for biliary drain. Intubated for inc RR. Transferred to [address]3600. 2L IVF for HoTn.   2/13N: 5.5 L for HoTN and Levo; A-line and CVC placed. DAYS: ECHO (bilateral pleural effusions), IV methadone restarted  2/14N: 2.5L p-lyte, levo off DAYS: PEEP weaned throughout the day, 1 uPRBC for H/H 6.9/21; MIVF d/c'd; Fi02 40% --> 100% d/t acute episode of hypoxia during repositioning in the evening.  2/15N: CXR done. Vent weaned from 100% at start of shift. Mediport de-accessed.  2/15D Methadone dose increased (plan to wean fentanyl). CXR showing worsening L pleural effusion. Lasix gtt initiated. VSS. Blood cultures positive for Klebsiella enterobacter.   2/16N: Fentanyl weaned. 68mEq K x3.   2/16D Tolerated PS for 3 hours on 50% 8/8. OOB to chair for an hour and a half with ceiling lift. Lasix gtt d/ced (net negative 7L yesterday). Plan is to extubate tomorrow.       To Do List   Goal: SBP > 90 or MAP > 60   PT consult   Q6H ABG with panel; Wean FiO2 for goal PO2 60-80   Flush biliary drain BID w/ 5cc NS (2000 and 0800)    Please remember to complete care plan and education record at 0400 and 1600 hours      Anticipatory Guidance / Discharge Planning  [facility] Prevention: ETT, NGT, LIJ CVC, L brachial a-line, Mediport, PIV x2, biliary drain, Foley     Daughter updated at bedside 2/15.

## 2017-04-20 NOTE — Plan of Care (Signed)
Bowel Elimination     Elimination patterns are normal or improving Maintaining        Post-Operative Bowel Elimination     Elimination pattern is normal or improving Maintaining        Safety     Patient will remain free of falls Maintaining          Cognitive function     Cognitive function will be maintained Progressing towards goal        Impaired Gas Exchange     Patient will maintain adequate oxygenation Progressing towards goal    Pt tolerating wan on vent on PS    Mobility     Functional status is maintained or improved - Geriatric Progressing towards goal    Pt OOB to chair using ceiling lift    Nutrition     Patient's nutritional status is maintained or improved Progressing towards goal    Continuing TPN/Lipids    Pain/Comfort     Patient's pain or discomfort is manageable Progressing towards goal    Weaning fentanyl gtt    Post-Operative Complications     Patient will remain free from symptoms of infection-post op Progressing towards goal        Post-Operative Hemodynamic Stability     Maintain Hemodynamic Stability Progressing towards goal    Remains HD stable

## 2017-04-20 NOTE — Progress Notes (Signed)
Report Given To  Marlise Eves RN      Descriptive Sentence / Reason for Admission   PMHx: pancreatic adenocarcinoma    1/25 s/p open Whipple. She is being admitted to SICU for post-operative monitoring, notably hemodynamic status and post-op pain control.      Active Issues / Relevant Events   2/8: upper GI series done  2/12: Continued confusion/pain on CC5, blood/urine culture sent. CT abd done; hematoma near common bile duct found; IR for biliary drain. Intubated for inc RR. Transferred to 83600. 2L IVF for HoTn.   2/13N: 5.5 L for HoTN and Levo; A-line and CVC placed. DAYS: ECHO (bilateral pleural effusions), IV methadone restarted  2/14N: 2.5L p-lyte, levo off DAYS: PEEP weaned throughout the day, 1 uPRBC for H/H 6.9/21; MIVF d/c'd; Fi02 40% --> 100% d/t acute episode of hypoxia during repositioning in the evening.  2/15N: CXR done. Vent weaned from 100% at start of shift. Mediport de-accessed.  2/15D Methadone dose increased (plan to wean fentanyl). CXR showing worsening L pleural effusion. Lasix gtt initiated. VSS. Blood cultures positive for Klebsiella enterobacter.   2/16N: Fentanyl weaned. 5mEq K x3.       To Do List   Goal: SBP > 90 or MAP > 60   PT consult   Q6H ABG with panel; Wean FiO2 for goal PO2 60-80   Flush biliary drain BID w/ 5cc NS (2000 and 0800)    Please remember to complete care plan and education record at 0400 and 1600 hours      Anticipatory Guidance / Discharge Planning  [facility] Prevention: ETT, NGT, LIJ CVC, L brachial a-line, Mediport, PIV x2, biliary drain, Foley     Daughter updated at bedside 2/15.

## 2017-04-20 NOTE — Progress Notes (Signed)
Report Given To  Charlsey, RN      Descriptive Sentence / Reason for Admission   PMHx: pancreatic adenocarcinoma    1/25 s/p open Whipple. She is being admitted to SICU for post-operative monitoring, notably hemodynamic status and post-op pain control.      Active Issues / Relevant Events   2/8: upper GI series done  2/12: Continued confusion/pain on CC5, blood/urine culture sent. CT abd done; hematoma near common bile duct found; IR for biliary drain. Intubated for inc RR. Transferred to 83600. 2L IVF for HoTn.   2/13N: 5.5 L for HoTN and Levo; A-line and CVC placed. DAYS: ECHO (bilateral pleural effusions), IV methadone restarted  2/14N: 2.5L p-lyte, levo off DAYS: PEEP weaned throughout the day, 1 uPRBC for H/H 6.9/21; MIVF d/c'd; Fi02 40% --> 100% d/t acute episode of hypoxia during repositioning in the evening.  2/15N: CXR done. Vent weaned from 100% at start of shift. Mediport de-accessed.  2/15D Methadone dose increased (plan to wean fentanyl). CXR showing worsening L pleural effusion. Lasix gtt initiated. VSS. Blood cultures positive for Klebsiella enterobacter.   2/16N: Fentanyl weaned. 41mEq K x3.   2/16D Tolerated PS for 3 hours on 50% 8/8. Lasix gtt off, diamox started      To Do List   Goal: SBP > 90 or MAP > 60   Q6H ABG with panel; Wean FiO2 for goal PO2 60-80   Flush biliary drain BID w/ 5cc NS (2000 and 0800)    Please remember to complete care plan and education record at 0400 and 1600 hours      Anticipatory Guidance / Discharge Planning  [facility] Prevention: ETT, NGT, LIJ CVC, L brachial a-line, Mediport, PIV x2, biliary drain, Foley     Daughter updated at bedside 2/16

## 2017-04-20 NOTE — Progress Notes (Signed)
---  Critical Care Bundle--    Fluids & Electrolytes: Lasix gtt stopped. Electrolytes supplemented last night    Nutrition / last BM: TPN/Lipids    Mobility plan: PT consult? Turn/position    Neuro / sedation / pain / sedation holiday: No sedation, fentanyl gtt weaned 66mg/hr, methadone dose increased, dilaudid PRN     Respiratory weaning (and PST, TC) / pulmonary toilet: 50% PS 8/8    Delirium / sleep / prevention / intervention: SAS 3-4 CAM +    List of antimicrobials: Zosyn    Drains / Lines / Tubes: pulled out NG, ETT, PTC, CVC, foley, a-line    Indication for foley: strict I/O    DVT prophylaxis / a PTT: SubQ heparin, SCDs pTT 26.9    Indications for gastric acid suppression: Protonix    Labs ordered:    ABG w/ whole blood panel Q6  BMP Q12  Daily CBCdiff, CMP, Mag, Phos, PT/INR, pTT  Triglycerides T/Th/S    Family concern / updates: Daughter updated last night    Patient likes to be called: Amanda Galloway   Code status: Full    Disposition / Level: ICU    Nursing reiterate plan for day: Y/N          Author: JLarina Earthly RN  as of: 04/20/2017  at: 9:36 AM

## 2017-04-20 NOTE — Progress Notes (Signed)
SICU Progress Note    Leontina Cullenis a 72 y.o.female with PMHx PE,pancreatic adenocarcinoma s/p uncomplicated Whipple 3/53 with Dr Ron Agee. Briefly admitted to SICU post-operatively for pain control, on home methadone for chronic cancer pain. Discharged to the floor 1/28. Developed n/v beginning 1/31, NGT placed with continued high output and concern for gastric outlet obstruction distal to the gastro-jejunostomy site. On 2/12 developed tachypnea, mental status changes, and worsening transaminitis. CT ABD/pelvis demonstrated a hematoma causing severe compression of the main portal vein and common bile duct. Decision was made to place an IR percutaneous bilary drain. Prior to placement patient had increased WOB and was intubated for the procedure. She was admitted to SICU on 2/12 post-procedure for close observation and sepsis management. She had septic shock that night and following day requiring large volume IVF resuscitation and norepinephrine infusion.     Interval History: Diuresing on lasix gtt; net negative 7.7L with 10L UOP.     Past Med/Sx History:   Past Medical History:   Diagnosis Date    Acute kidney failure 03/31/2017    Cancer     Depression     Fibromyalgia     Hypothyroidism        Physical Exam by Systems:  See attending physician physical exam     Assessment and Plan for Active/Followed Hospital Problems:   Active Hospital Problems    Diagnosis    *!*Pancreatic adenocarcinoma s/p Whipple 03/29/17     -Whipple on 1/25 with Dr. Ron Agee  [facility] course c/b gastric outlet obstruction d/t severe compression of the main portal vein and common bile duct by hematoma, PTC drain 2/12 in IR  -Started on Zosyn 2/12  -Continue Decadron  -Continue TPN with SSI for glucose control        Sepsis     - Occurring in setting of biliary obstruction s/p PTC external/internal placement for source control  - BCx NGTD, Zosyn (2/12-) Vanco (2/12-)  - Levo off   - Goal SBP >90, MAP >60   - Tolerating diuresis       Acute respiratory failure     - Intubated 2/12 prior to [facility] placement for increased WOB  - Weaning ventilator as tolerated  - CXR 2/14 worsening bilateral pleural effusion,   - 2/15 Lasix gtt   - net neg 7.7L last 24 hours       hx of Pulmonary embolism     - PE 01/18/17 on CT chest   - This hospitalization has been on therapeutic Lovenox, and Heparin gtt  - Currently holding therapeutic anticoagulation in setting of hematoma  - Heparin SQ BID      Acute blood loss anemia     - H/H 8.2/24, stable, no indication for transfusion  - Last transfusion 2/14  - Will monitor and transfuse prn        Cancer associated pain     Methadone at home  Pain management guided by patient's primary pain physician Dr Irven [place]  - methadone at 61m IV BID (09:00 & 21:00) and 7.5 mg IV qDay @ 13:00  - Fenatnyl gtt while intubated and then would transition back to dilaudid PCA  - 2/15 increase methadone to 7.539mand 1013m- QTc 450, monitor for prolongation  - wean Fentanyl gtt        Depression     -Continue Elavil and Lexapro      Hypothyroidism     -Synthroid          Diagnoses  for this hospitalization include:  Acute respiratory failure, sepsis        ---SICU Critical Care  Bundle reviewed with bedside nursing    Author: Peyton Najjar, NP  as of: 04/20/2017  at: 8:26 AM

## 2017-04-20 NOTE — Progress Notes (Signed)
Surgical Oncology/Hepatobiliary Surgery Progress Note     LOS: 22 days     Subjective:    No acute events overnight.  Was net negative 7.6 L yesterday.  Furosemide gtt is currently being held.  Serum bilirubin is within normal limits (1.5 -> 0.7).    Objective:    Vitals Sign Ranges for Past 24 Hours:  BP: (89-123)/(53-89)   Temp:  [36.3 C (97.3 F)-36.8 C (98.2 F)]   Temp src: Temporal (02/16 0800)  Heart Rate:  [74-94]   Resp:  [12-28]   SpO2:  [93 %-99 %]       Physical Exam:    General Appearance: intubated, following commands  HEENT: NGT with brown gastric fluid output  Cardiac: regular rate  Respiratory: mechanically ventilated  Abdomen: edematous, no apparent tenderness to palpation, biliary drain with bilious output  Extremities: warm    Labs:    CBC:    Recent Labs  Lab 04/20/17  0528 04/19/17  2329  04/19/17  0017  04/17/17  2344  04/17/17  0044 04/16/17  2201  04/16/17  1718 04/16/17  1119   WBC  --  22.1*  --  22.7*  --  21.2*  --  9.9  --   --  2.9* 7.4   Hemoglobin 9.3* 8.2*  < > 7.7*   7.9*  < > 6.9*  < > 6.8*   7.3* 5.8*  < > 7.2*   7.7* 7.5*   Hematocrit  --  24*  --  22*  --  21*  --  21* 18*  --  23* 24*   Platelets  --  335  --  298  --  384*  --  309  --   --  413* 501*   < > = values in this interval not displayed.    Metabolic Panel:    Recent Labs  Lab 04/19/17  2329 04/19/17  0017 04/17/17  2344 04/17/17  1807 04/17/17  1048 04/17/17  0044 04/16/17  1718  04/16/17  0100   Sodium 138 135 132*  --  133 133 131*  < > 131*   Potassium 3.4 3.9 4.0  --  3.7 3.8 3.6  < > 4.0   Chloride 97 99 97  --  96 97 96  < > 95*   CO2 '27 23 22  ' --  21 21 19*  < > 24   UN 24* 22* 22*  --  22* 20 20  < > 21*   Creatinine 0.85 0.80 0.83  --  0.96* 0.95 0.73  < > 0.74   Glucose 135* 187* 224*  --  179* 74 184*  < > 146*   Calcium 8.8 8.9 7.8*  --  7.9* 7.9* 7.5*  < > 8.1*   Magnesium 1.6 2.2 1.9  --   --  1.7 1.6  --  1.9   Phosphorus 3.0 3.1 3.1 2.7  --  1.2*  --   --  2.9   < > = values in this interval not  displayed.       Assessment:    72 y.o. female with h/o recent PE and pancreatic cancer POD # 22  status post whipple procedure complicated by delayed gastric emptying.  NGT replaced on 04/04/17. UGI on 04/12/17 concerning for obstruction just beyond the Stephen in the efferent limb.  Course complicated on 2/95/28 by rising LFTs and sepsis with CT concerning for biliary tree obstruction and PV compression due to  hematoma and afferent limb distention in setting of PE treatment. Is s/p IR PTC 04/16/17.      Plan:    - NPO, TPN  - NGT to LIWS  - PTC/biliary drain to gravity; flush daily towards patient  - IV Zosyn for cholangitis  - Wean ventilator as tolerated  - EGD by GI when clinically improved  - SCDs, heparin SC (hold therapeutic anticoagulation) for VTE ppx  - Appreciate SICU care      [name], MD  04/20/2017     9:43 AM  General Surgery Resident

## 2017-04-20 NOTE — Progress Notes (Signed)
SW provided family with voucher for FOUR gratis parking passes for a total of FOURTEEN passes for this hospitalization.  Pt's family has received the maximum number of vouchers allowed during this admission. SW explained that this voucher needs to be redeemed at parking office on the ground floor of the garage. Voucher sent to unit via tube station # [phone]. Pt's family will need to discuss any additional parking needs with unit SW'er.    Billy Fischer, LMSW  Social Worker  Weekend Med/Surg   [place] [phone]

## 2017-04-21 LAB — CBC AND DIFFERENTIAL
Baso # K/uL: 0 10*3/uL (ref 0.0–0.1)
Basophil %: 0 %
Eos # K/uL: 0.1 10*3/uL (ref 0.0–0.4)
Eosinophil %: 0.9 %
Hematocrit: 24 % — ABNORMAL LOW (ref 34–45)
Hemoglobin: 8.1 g/dL — ABNORMAL LOW (ref 11.2–15.7)
Lymph # K/uL: 0.6 10*3/uL — ABNORMAL LOW (ref 1.2–3.7)
Lymphocyte %: 4.4 %
MCH: 30 pg/cell (ref 26–32)
MCHC: 34 g/dL (ref 32–36)
MCV: 89 fL (ref 79–95)
Mono # K/uL: 0.3 10*3/uL (ref 0.2–0.9)
Monocyte %: 1.8 %
Neut # K/uL: 14.3 10*3/uL — ABNORMAL HIGH (ref 1.6–6.1)
Nucl RBC # K/uL: 0 10*3/uL (ref 0.0–0.0)
Nucl RBC %: 0.1 /100 WBC (ref 0.0–0.2)
Platelets: 309 10*3/uL (ref 160–370)
RBC: 2.7 MIL/uL — ABNORMAL LOW (ref 3.9–5.2)
RDW: 18.3 % — ABNORMAL HIGH (ref 11.7–14.4)
Seg Neut %: 85 %
WBC: 15.5 10*3/uL — ABNORMAL HIGH (ref 4.0–10.0)

## 2017-04-21 LAB — ART GASES / WHOLE BLOOD PANEL
Base Excess, Arterial: 1 mmol/L (ref <=2)
Base Excess, Arterial: 2 mmol/L (ref <=2)
Base Excess, Arterial: 2 mmol/L (ref <=2)
CO2,ART (Calc): 27 mmol/L (ref 23–28)
CO2,ART (Calc): 27 mmol/L (ref 23–28)
CO2,ART (Calc): 27 mmol/L (ref 23–28)
CO: 0.4 %
CO: 0.7 %
CO: 1.1 %
FO2 Hb, Arterial: 93 % (ref 90–100)
FO2 Hb, Arterial: 94 % (ref 90–100)
FO2 Hb, Arterial: 95 % (ref 90–100)
Glucose,WB: 151 mg/dL — ABNORMAL HIGH (ref 60–99)
Glucose,WB: 170 mg/dL — ABNORMAL HIGH (ref 60–99)
Glucose,WB: 175 mg/dL — ABNORMAL HIGH (ref 60–99)
HCO3, Arterial: 26 mmol/L (ref 21–26)
HCO3, Arterial: 26 mmol/L (ref 21–26)
HCO3, Arterial: 26 mmol/L (ref 21–26)
Hemoglobin: 8.3 g/dL — ABNORMAL LOW (ref 11.2–15.7)
Hemoglobin: 8.8 g/dL — ABNORMAL LOW (ref 11.2–15.7)
Hemoglobin: 9.7 g/dL — ABNORMAL LOW (ref 11.2–15.7)
ICA @7.4,WB: 4.6 mg/dL — ABNORMAL LOW (ref 4.8–5.2)
ICA @7.4,WB: 4.6 mg/dL — ABNORMAL LOW (ref 4.8–5.2)
ICA @7.4,WB: 4.7 mg/dL — ABNORMAL LOW (ref 4.8–5.2)
ICA Uncorr,WB: 4.5 mg/dL
ICA Uncorr,WB: 4.6 mg/dL
ICA Uncorr,WB: 4.6 mg/dL
Lactate ART,WB: 1.1 mmol/L — ABNORMAL HIGH (ref 0.3–0.8)
Lactate ART,WB: 1.1 mmol/L — ABNORMAL HIGH (ref 0.3–0.8)
Lactate ART,WB: 1.2 mmol/L — ABNORMAL HIGH (ref 0.3–0.8)
Methemoglobin: 0.2 % (ref 0.0–1.0)
Methemoglobin: 0.3 % (ref 0.0–1.0)
Methemoglobin: 0.5 % (ref 0.0–1.0)
NA, WB: 132 mmol/L — ABNORMAL LOW (ref 135–145)
NA, WB: 133 mmol/L — ABNORMAL LOW (ref 135–145)
NA, WB: 133 mmol/L — ABNORMAL LOW (ref 135–145)
Potassium,WB: 3.2 mmol/L — ABNORMAL LOW (ref 3.4–4.7)
Potassium,WB: 3.4 mmol/L (ref 3.4–4.7)
Potassium,WB: 3.6 mmol/L (ref 3.4–4.7)
pCO2, Arterial: 36 mm Hg (ref 35–46)
pCO2, Arterial: 38 mm Hg (ref 35–46)
pCO2, Arterial: 40 mm Hg (ref 35–46)
pH: 7.42 (ref 7.35–7.45)
pH: 7.45 (ref 7.35–7.45)
pH: 7.47 — ABNORMAL HIGH (ref 7.35–7.45)
pO2,Arterial: 66 mm Hg — ABNORMAL LOW (ref 80–100)
pO2,Arterial: 80 mm Hg (ref 80–100)
pO2,Arterial: 89 mm Hg (ref 80–100)

## 2017-04-21 LAB — DIFF MANUAL
Bands %: 7 % (ref 0–10)
Diff Based On: 114 CELLS
Metamyelocyte %: 1 % (ref 0–1)

## 2017-04-21 LAB — COMPREHENSIVE METABOLIC PANEL
ALT: 134 U/L — ABNORMAL HIGH (ref 0–35)
AST: 47 U/L — ABNORMAL HIGH (ref 0–35)
Albumin: 2.6 g/dL — ABNORMAL LOW (ref 3.5–5.2)
Alk Phos: 266 U/L — ABNORMAL HIGH (ref 35–105)
Anion Gap: 14 (ref 7–16)
Bilirubin,Total: 0.6 mg/dL (ref 0.0–1.2)
CO2: 26 mmol/L (ref 20–28)
Calcium: 8.6 mg/dL (ref 8.6–10.2)
Chloride: 97 mmol/L (ref 96–108)
Creatinine: 1 mg/dL — ABNORMAL HIGH (ref 0.51–0.95)
GFR,Black: 66 *
GFR,Caucasian: 57 * — AB
Glucose: 165 mg/dL — ABNORMAL HIGH (ref 60–99)
Lab: 39 mg/dL — ABNORMAL HIGH (ref 6–20)
Potassium: 4 mmol/L (ref 3.3–5.1)
Sodium: 137 mmol/L (ref 133–145)
Total Protein: 5.3 g/dL — ABNORMAL LOW (ref 6.3–7.7)

## 2017-04-21 LAB — BASIC METABOLIC PANEL
Anion Gap: 16 (ref 7–16)
CO2: 25 mmol/L (ref 20–28)
Calcium: 8.8 mg/dL (ref 8.6–10.2)
Chloride: 96 mmol/L (ref 96–108)
Creatinine: 1.1 mg/dL — ABNORMAL HIGH (ref 0.51–0.95)
GFR,Black: 58 * — AB
GFR,Caucasian: 51 * — AB
Glucose: 143 mg/dL — ABNORMAL HIGH (ref 60–99)
Lab: 45 mg/dL — ABNORMAL HIGH (ref 6–20)
Potassium: 3.7 mmol/L (ref 3.3–5.1)
Sodium: 137 mmol/L (ref 133–145)

## 2017-04-21 LAB — POCT GLUCOSE
Glucose POCT: 134 mg/dL — ABNORMAL HIGH (ref 60–99)
Glucose POCT: 143 mg/dL — ABNORMAL HIGH (ref 60–99)
Glucose POCT: 151 mg/dL — ABNORMAL HIGH (ref 60–99)
Glucose POCT: 163 mg/dL — ABNORMAL HIGH (ref 60–99)

## 2017-04-21 LAB — APTT: aPTT: 27.4 s (ref 25.8–37.9)

## 2017-04-21 LAB — BLOOD CULTURE

## 2017-04-21 LAB — PHOSPHORUS: Phosphorus: 4.5 mg/dL (ref 2.7–4.5)

## 2017-04-21 LAB — PROTIME-INR
INR: 1.2 — ABNORMAL HIGH (ref 0.9–1.1)
Protime: 13.2 s — ABNORMAL HIGH (ref 10.0–12.9)

## 2017-04-21 LAB — MAGNESIUM: Magnesium: 1.8 mg/dL (ref 1.6–2.5)

## 2017-04-21 LAB — RBC MORPHOLOGY

## 2017-04-21 MED ORDER — STERILE WATER FOR INJECTION (TPN USE ONLY) *I*
INTRAVENOUS | Status: AC
Start: 2017-04-21 — End: 2017-04-22
  Filled 2017-04-21: qty 840

## 2017-04-21 MED ORDER — FAT EMULSION SOYBEAN OIL 20 % IV EMUL *WRAPPED*
60.0000 g | Freq: Every day | INTRAVENOUS | Status: AC
Start: 2017-04-21 — End: 2017-04-22
  Administered 2017-04-21 – 2017-04-22 (×12): 60 g via INTRAVENOUS
  Filled 2017-04-21: qty 300

## 2017-04-21 MED ORDER — FUROSEMIDE 145 MG/72.5 ML (2 MG/ML) INFUSION *I*
4.0000 mg/h | INTRAVENOUS | Status: DC
Start: 2017-04-21 — End: 2017-04-21
  Administered 2017-04-21 (×16): 4 mg/h via INTRAVENOUS
  Filled 2017-04-21: qty 72.5

## 2017-04-21 NOTE — Progress Notes (Signed)
---  Critical Care Bundle--    Fluids & Electrolytes: No MIVF. Electrolytes prn.     Nutrition / last BM: TPN/lipids    Mobility plan: AAT, plan for OOB today. Needs PT order.     Neuro / sedation / pain / sedation holiday: Fentanyl gtt off. Methadone 7.5 mg BID (08, 20), Methadone 10 mg daily (1300), Dilaudid 1 mg q1 hr prn    Respiratory weaning (and PST, TC) / pulmonary toilet: Extubated to HF NC this morning. C&DB, IS, flutter     Delirium / sleep / prevention / intervention: Positive for delirium - level II.      List of antimicrobials: Zosyn 3.375 g q6     Drains / Lines / Tubes: NG, L IJ TLC, PIV x 2, foley, biliary drain     Indication for foley: Strict I&O    DVT prophylaxis / a PTT: heparin 5,000 units SQ q8. Last aPTT 27.4 (on 2/17)     Indications for gastric acid suppression: Protonix 40 mg daily    Labs ordered:  BID BMP  Daily CBCd, CMP, Mag, Phos, aPTT, PT/INR  Triglycerides Tue, Thurs, Sat      Family concern / updates: Updated when available    Patient likes to be called: Amanda Galloway     Code status: FULL     Disposition / Level: ICU    Nursing reiterate plan for day: Y          Author: Lonia Farber, RN  as of: 04/21/2017  at: 9:12 AM

## 2017-04-21 NOTE — Progress Notes (Addendum)
Physical Therapy Re-Evaluation      Amanda Galloway is a 72 year old female s/p Whipple with complicated post op course as follows: She developed nausea and vomiting on 1/31 prompting NG tube placement. She has since had high gastric output requiring TPN for nutrition. On 2/12, she developed tachypnea and confusion. CT of the abdomen/pelvis revealed a hematoma compressing the portal vein and common bile duct. She was taken to IR where she underwent placement of a percutaneous biliary drain. She had to be intubated for the procedure due to her marginal respiratory status. She was admitted to the SICU service for close observation and management of sepsis.       Mobility Recommendation Mobility Recommendations: Saralift vs hoyer OOB      04/21/17 1410   PT Tracking   PT TRACKING PT Assigned   Visit Number   Visit Number [facility]) / Treatment Day (HH) 1  (re-eval s/p ICU transfer)   Precautions/Observations   Precautions used Yes   LDA Observation Monitors;Central Line;A-line;O2 (comment);Foley cath;Drain  (NG tube, HFNC)   Fall Precautions General falls precautions   Other Pt's daughter present and supportive in room.   Pain Assessment   *Is the patient currently in pain? Yes   Revised FLACC - Face 1   Revised FLACC - Legs 1   Revised FLACC - Activity 1   Revised FLACC - Cry 1   Revised FLACC - Consolability 1   Revised FLACC Score 5   Cognition   Arousal/Alertness Delayed responses to stimuli   Orientation Oriented to person;Disoriented to place;Disoriented to time;Disoriented to situation   Following Commands Intermittently follows commands   Intermittently Follows Commands (less than 25% of the time, assist to initiate all tasks)   Additional Comments Pt recently received IV methadone prior to session. Attempted to verbalize a few times but very soft and seemingly nonsensicle.    Bed Mobility   Bed mobility Tested   Supine to Sit Not tested  (Pt hoyered OOB to chair prior to session)   Sit to Supine 2 person  assist;Maximum assist ;Side rails up (#);Head of bed flat   Transfers   Transfers Tested   Sit to Stand Mechanical lift   Additional comments Utilized Saralift for sit to stand transfer x2 reps today. Pt required total assist for lift set up. Once lift activated she assisted and was able to achieve full upright standing, although did yell out in pain. She tolerated standing ~20 seconds each time with ability to transfer her from chair to bed upon standing the second time.   Mobility   Mobility Not tested (comment)   Additional comments Pt unable to at this time.   Therapeutic Exercises   Additional comments Worked on hand placement on recliner chair arm rest and anterior weight shifts as pre-transfer activities. Attempted UE reaching as well as LE exercises (LAQs and hip flexion) in sitting prior to using the [place]. Pt did not participate but was able to maintain static sitting balance once placed into the position.   Balance   Balance Tested   Sitting - Static Standby assist   PT AM-PAC Mobility   Turning over in bed? 1   Sitting down on and standing up from a chair with arms? 1   Moving from lying on back to sitting on the side of the bed? 1   Moving to and from a bed to a chair? 1   Need to walk in hospital room? 1   Climbing 3 -  5 steps with a railing? 1   Total Raw Score 6   Standardized Score 23.55   CMS 1-100% Score 100   Medicare Functional Limit Modifiers CN 100% impaired, limited, restricted   Assessment   Brief Assessment Remains appropriate for skilled therapy   Problem List Impaired functional mobility   Patient / Family Goal Pt did not state   Plan/Recommendation   Treatment Interventions Assess functional mobility;Restorative PT   PT Frequency 2-4x/wk   Mobility Recommendations Saralift vs hoyer OOB   Discharge Recommendations (rehab, pending further progression)   Assessment/Recommendations Reviewed With: Patient;Nursing;Family   Next PT Visit Progressive mobility - continue Saralift but trial  standing with RW as participation allows.   Time Calculation   PT Timed Codes 33   PT Untimed Codes 0   PT Unbilled Time 0   PT Total Treatment 33   Plan and Onset date   Plan of Care Date 04/21/17   Treatment Start Date 04/21/17   PT Charges   $PT [facility] Charges Functional Training - code 0239 (54minx2)         Please page with any questions or concerns.    Robby Sermon, PT, DPT  Pager [phone]

## 2017-04-21 NOTE — Plan of Care (Signed)
Cognitive function     Cognitive function will be maintained Maintaining        Nutrition     Patient's nutritional status is maintained or improved Maintaining        Post-Operative Bowel Elimination     Elimination pattern is normal or improving Maintaining        Post-Operative Complications     Patient will remain free from symptoms of infection-post op Maintaining        Safety     Patient will remain free of falls Maintaining          Impaired Gas Exchange     Patient will maintain adequate oxygenation Progressing towards goal    Extubated to HFNC, tolerating well.     Mobility     Functional status is maintained or improved - Geriatric Progressing towards goal    OOB to chair, worked with PT, back to bed with Vertis Kelch     Pain/Comfort     Patient's pain or discomfort is manageable Progressing towards goal

## 2017-04-21 NOTE — Progress Notes (Signed)
Report Given To  Elmyra Ricks RN      Descriptive Sentence / Reason for Admission   PMHx: pancreatic adenocarcinoma    1/25 s/p open Whipple. She is being admitted to SICU for post-operative monitoring, notably hemodynamic status and post-op pain control.      Active Issues / Relevant Events   2/8: upper GI series done  2/12: Continued confusion/pain on CC5, blood/urine culture sent. CT abd done; hematoma near common bile duct found; IR for biliary drain. Intubated for inc RR. Transferred to [address]00. 2L IVF for HoTn.   2/13N: 5.5 L for HoTN and Levo; A-line and CVC placed. DAYS: ECHO (bilateral pleural effusions), IV methadone restarted  2/14N: 2.5L p-lyte, levo off DAYS: PEEP weaned throughout the day, 1 uPRBC for H/H 6.9/21; MIVF d/c'd; Fi02 40% --> 100% d/t acute episode of hypoxia during repositioning in the evening.  2/15N: CXR done. Vent weaned from 100% at start of shift. Mediport de-accessed.  2/15D Methadone dose increased (plan to wean fentanyl). CXR showing worsening L pleural effusion. Lasix gtt initiated. VSS. Blood cultures positive for Klebsiella enterobacter.   2/16N: Fentanyl weaned. 69mEq K x3.   2/16D Tolerated PS for 3 hours on 50% 8/8. Lasix gtt off, diamox started  2/17: Lasix restarted      To Do List   Goal: SBP > 90 or MAP > 60   Q6H ABG with panel; Wean FiO2 for goal PO2 60-80   Flush biliary drain BID w/ 5cc NS (2000 and 0800)    Please remember to complete care plan and education record at 0400 and 1600 hours      Anticipatory Guidance / Discharge Planning  [facility] Prevention: ETT, NGT, LIJ CVC, L brachial a-line, Mediport, PIV x2, biliary drain, Foley     Daughter updated at bedside 2/16

## 2017-04-21 NOTE — Progress Notes (Signed)
Surgical Oncology/Hepatobiliary Surgery Progress Note     LOS: 23 days     Subjective:    No acute events overnight.  Tolerated pressure support x 3 hours and was net negative 3.2 L yesterday.    Objective:    Vitals Sign Ranges for Past 24 Hours:  BP: (100-146)/(70-83)   Temp:  [36.2 C (97.2 F)-36.9 C (98.4 F)]   Temp src: Temporal (02/17 0300)  Heart Rate:  [75-98]   Resp:  [11-31]   SpO2:  [94 %-99 %]       Physical Exam:    General Appearance: intubated, following commands  HEENT: NGT with brown gastric fluid output  Cardiac: regular rate  Respiratory: mechanically ventilated  Abdomen: edematous, no apparent tenderness to palpation, biliary drain with light brown bilious output  Extremities: warm    Labs:    CBC:    Recent Labs  Lab 04/21/17  0536 04/21/17  0005  04/19/17  2329  04/19/17  0017  04/17/17  2344  04/17/17  0044 04/16/17  2201  04/16/17  1718   WBC  --  15.5*  --  22.1*  --  22.7*  --  21.2*  --  9.9  --   --  2.9*   Hemoglobin 8.8* 8.1*   8.3*  < > 8.2*  < > 7.7*   7.9*  < > 6.9*  < > 6.8*   7.3* 5.8*  < > 7.2*   7.7*   Hematocrit  --  24*  --  24*  --  22*  --  21*  --  21* 18*  --  23*   Platelets  --  309  --  335  --  298  --  384*  --  309  --   --  413*   < > = values in this interval not displayed.    Metabolic Panel:    Recent Labs  Lab 04/21/17  0005 04/20/17  1210 04/19/17  2329 04/19/17  0017 04/17/17  2344 04/17/17  1807 04/17/17  1048 04/17/17  0044 04/16/17  1718   Sodium 137 137 138 135 132*  --  133 133 131*   Potassium 4.0 3.7 3.4 3.9 4.0  --  3.7 3.8 3.6   Chloride 97 96 97 99 97  --  96 97 96   CO2 26 30* _0 --  21 21 19*   UN 39* 28* 24* 22* 22*  --  22* 20 20   Creatinine 1.00* 1.00* 0.85 0.80 0.83  --  0.96* 0.95 0.73   Glucose 165* 111* 135* 187* 224*  --  179* 74 184*   Calcium 8.6 9.1 8.8 8.9 7.8*  --  7.9* 7.9* 7.5*   Magnesium 1.8  --  1.6 2.2 1.9  --   --  1.7 1.6   Phosphorus 4.5  --  3.0 3.1 3.1 2.7  --  1.2*  --           Assessment:    72 y.o. female with  h/o recent PE and pancreatic cancer POD # 23  status post whipple procedure complicated by delayed gastric emptying.  NGT replaced on 04/04/17. UGI on 04/12/17 concerning for obstruction just beyond the [place] in the efferent limb.  Course complicated on 9/56/38 by rising LFTs and sepsis with CT concerning for biliary tree obstruction and PV compression due to hematoma and afferent limb distention in setting of PE treatment. Is s/p IR PTC  04/16/17.      Plan:    - NPO, TPN  - NGT to LIWS  - PTC/biliary drain to gravity  - IV Zosyn x 7 days for cholangitis  - Pressure support, continued diuresis as tolerated  - EGD by GI when clinically improved  - SCDs, heparin SC (hold therapeutic anticoagulation) for VTE ppx  - Appreciate SICU care      [name], MD  04/21/2017     8:10 AM  General Surgery Resident

## 2017-04-21 NOTE — Progress Notes (Signed)
Critical Care Medicine Attending Note:      If there are key historical elements or objective findings that I think deserve particular emphasis, I have recorded them below. A summary of key elements of our assessment and plans is listed in the resident / medical student / PA / NP's note. Please refer to this note for complete details of our mutually agreed upon findings, assessment, and recommendations.  The problem list reflects my input.    Summary of history of present illness:  The patient is a 72 year old woman with a medical history significant for hypertension, pulmonary embolism (diagnosed in November 2018, on Lovenox at home), fibromyalgia, gastroesophageal reflux disease, hypothyroidism, and anxiety/depression. 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 She had a cholecystectomy that failed to relieve her symptoms. She has completed neoadjuvant chemotherapy and radiation therapy. She has required methadone for chronic abdominal pain. On 1/25,she underwent a Whipple procedure. The surgical procedure and anesthetic were uncomplicated. She was admitted to the SICU service for ongoing management and the expectation of complicated pain management. She did well and was discharged to the floor on 1/28. She developed nausea and vomiting on 1/31 prompting NG tube placement. She has since had high gastric output requiring TPN for nutrition. On 2/12, she developed tachypnea and confusion. She had a worsening transaminitis. CT of the abdomen/pelvis revealed a hematoma compressing the portal vein and common bile duct. She was taken to IR where she underwent placement of a percutaneous biliary drain. She had to be intubated for the procedure due to her marginal respiratory status. She was admitted to the SICU service for close observation and management of sepsis. She was  very septic that night and following dayrequiring a large IV fluid volume resuscitation and relatively high dose norepinephrine infusion. She has been improving.  She has been tolerating a forced diuresis.    Interval history:  No significant new problems noted overnight.     Objective Section:  BP: (100-146)/(70-83)   Temp:  [36.2 C (97.2 F)-36.9 C (98.4 F)]   Temp src: Temporal (02/17 0800)  Heart Rate:  [75-98]   Resp:  [11-31]   SpO2:  [94 %-97 %]     FiO2: 40 % (04/21/17 0911) O2 Flow Rate: 30 L/min (04/21/17 0911)      Intake/Output last 3 shifts:  I/O last 3 completed shifts:  02/16 0700 - 02/17 0659  In: 2874.3 (39.1 mL/kg) [I.V.:99.7 (0.1 mL/kg/hr); Other:160; NG/GT:185; IV Piggyback:331.7]  Out: 6110 (83.1 mL/kg) [Urine:5605 (3.2 mL/kg/hr); Emesis/NG output:505]  Net: -3235.7  Weight: 73.5 kg     Intake/Output this shift:  I/O this shift:  02/17 0700 - 02/17 1459  In: 156.5 (2.1 mL/kg) [I.V.:6.5]  Out: 1425 (19.4 mL/kg) [Urine:1425]  Net: -1268.5  Weight: 73.5 kg       Vent settings for last 24 hours:  Ventilator: Drager  MODE: PS  Ventilator On: Yes  FiO2: 40 %  AutoFlow: On  S RR: 14  Vt: 450 mL  PEEP: 5 cm H20  PS: 5 cm H2O  IT: 0.9 sec  Rise Time/Slope: 0.2  Sensitivity: 1  A RR: 13  PIP: 11 cm H2O  Plateau Pres: 20  MAP: 6 cmH2O  MV: 7.7 l/min  A VT: 813 mL  Vt exp: 778 mL  Vt Spon Insp: 509 mL  A VT: 790 mL  Physical exam:      General: The patient is on the ventilator and is in no apparent distress.  The patient is awake, alert, interactive, and appropriate.     Pulmonary: Symmetric breath sounds, essentially clear bilaterally.     Cardiac: Regular rate and rhythm.     Abdominal: Active bowel sounds, soft, non-distended.         Extremities: Warm, pink, no edema.      Labs:  Lab Results   Component Value Date    WBC 15.5 (H) 04/21/2017    HCT 24 (L) 04/21/2017    PLT 309 04/21/2017     Lab Results   Component Value Date    NA 137 04/21/2017    K 4.0 04/21/2017    CL 97 04/21/2017    CO2 26  04/21/2017    UN 39 (H) 04/21/2017    CREAT 1.00 (H) 04/21/2017    WBGLU 151 (H) 04/21/2017    PGLU 151 (H) 04/21/2017     Lab Results   Component Value Date    CA 8.6 04/21/2017    MG 1.8 04/21/2017    PO4 4.5 04/21/2017     Lab Results   Component Value Date    PTI 13.2 (H) 04/21/2017    INR 1.2 (H) 04/21/2017    PTT 27.4 04/21/2017     Lab Results   Component Value Date    ALT 134 (H) 04/21/2017    AST 47 (H) 04/21/2017    ALK 266 (H) 04/21/2017     Bilirubin,Total   Date Value Ref Range Status   04/21/2017 0.6 0.0 - 1.2 mg/dL Final           Lab results: 04/21/17  0536  04/16/17  1718   Lactate ART,WB 1.2*  < >  --    Lactate VEN,WB  --   --  4.6*   < > = values in this interval not displayed.           Currently Active/Followed Hospital Problems:  Active Hospital Problems    Diagnosis    *!*Pancreatic adenocarcinoma s/p Whipple 03/29/17     -Whipple on 1/25 with Dr. Ron Agee  [facility] course c/b gastric outlet obstruction d/t severe compression of the main portal vein and common bile duct by hematoma, PTC drain 2/12 in IR  -Started on Zosyn 2/12  -Continue Decadron  -Continue TPN with SSI for glucose control        Sepsis     - Occurring in setting of biliary obstruction s/p PTC external/internal placement for source control  - BCx NGTD, Zosyn (2/12-) Vanco (2/12-2/14)  - Levo off   - Goal SBP >90, MAP >60   - Tolerating diuresis      Acute respiratory failure     - Intubated 2/12 prior to [facility] placement for increased WOB  - Weaning ventilator as tolerated  - CXR 2/14 worsening bilateral pleural effusion,   - 2/15 Lasix gtt   - net neg 3.1 L last 24 hours  - PST daily - evaluate for extubation  - IS and Flutter valve once extubated       hx of Pulmonary embolism     - PE 01/18/17 on CT chest   - This hospitalization has been on therapeutic Lovenox, and Heparin gtt  - Currently holding therapeutic anticoagulation in setting of hematoma  - Heparin SQ TID      Acute blood loss anemia     - H/H 8.1/24,  stable, no  indication for transfusion  - Last transfusion 2/14  - Will monitor and transfuse prn        Cancer associated pain     Methadone at home  Pain management guided by patient's primary pain physician Dr Irven [place]  - methadone at 17m IV BID (09:00 & 21:00) and 7.5 mg IV qDay @ 13:00  - 2/15 increase methadone to 7.536mand 1049m- QTc acceptable, monitor for prolongation  - Weaning fentanyl gtt and giving dilaudid PRN - consider PCA when appropriate        Depression     -Continue Elavil and Lexapro      Acute kidney injury     - Creatinine baseline ~0.8  - Diuresing last 48 hours with lasix and diamox  - Creatinine now 1.0, UN 39  - Will continue to monitor      Hypothyroidism     -Synthroid            Attending summary impressions:     The patient had been critically ill with biliary sepsis due to an obstructed afferent reux limb for the Whipple procedure.  She has been improving.   The patient has acceptable hemodynamics and is clinically perfusing well.  She is tolerating a large negative fluid balance (3 liters).  The patient has a normal lactate, which is consistent with excellent perfusion.   The patient was comfortable on minimal ventilator support.  The patient's rapid shallow breathing index was < 80.   The patient was extubated without incident.  The patient is oxygenating and ventilating adequately on minimal supplemental oxygen.  The patient has improving respiratory insufficiency.    She has resolving sepsis.  She is afebrile.  She still has a significant leukocytosis but it is much improved since yesterday.  She is being treated with Zosyn.  We are planning on 7 day course (ends 2/19)   Her liver function tests continue to improve consistent with improving shock liver   Her NG output remains low consistent with with improved gastric outlet. obstruction.  Discussed starting low rate tube feeds with the surgical service.  She remains on TPN.   The metabolic alkalosis corrected with  acetazolamide.   The patient has acceptable anemia and a stable hematocrit.  No transfusion is needed.   Encouraging activity.  PT has been ordered.   She is making good progress.    Attending Attestation    This is a high complexity patient.     _0  This patient is critically ill with at least 1 organ system failure including sepsis, shock liver, and anemia.. The care I have delivered involved high complexity decision making to assess, manipulate, and support vital system function(s), to treat the vital organ system failure and/or prevent further deterioration of the patient's condition. All nursing documentation, laboratory data, test results, and radiographs were reviewed and interpreted by me. I have established the management plan for this patient's critical illness and have been immediately available to assist with patient care. My documented total critical care time reflects my own patient care time and does not include teaching or procedure time.     Critical care time: Personal time spent - (exclusive of performance of any procedures performed in the last 24 hours):  25 minutes    Signed by: JOSLorenza ChickD as of 04/21/2017 at 9:40 AM.

## 2017-04-21 NOTE — Plan of Care (Signed)
Problem: Impaired Bed Mobility  Goal: STG - IMPROVE BED MOBILITY  Patient will perform bed mobility with rails and the head of bed up with Minimal assist of 2    Time frame: 5-7 days    Problem: Impaired Transfers  Goal: STG - IMPROVE TRANSFERS  Patient will complete Sit to stand transfers using least restrictive assistive device with Minimal assist of 2    Time frame: 5-7 days

## 2017-04-21 NOTE — Progress Notes (Signed)
Patient extubated to High flow cannula no stridor ,good cough tolerates well

## 2017-04-21 NOTE — Progress Notes (Signed)
SICU Progress Note    Amanda Cullenis a 72 y.o.female with PMHx PE,pancreatic adenocarcinoma s/p uncomplicated Whipple 6/60 with Dr Ron Agee. Briefly admitted to SICU post-operatively for pain control, on home methadone for chronic cancer pain. Discharged to the floor 1/28. Developed n/v beginning 1/31, NGT placed with continued high output and concern for gastric outlet obstruction distal to the gastro-jejunostomy site. On 2/12 developed tachypnea, mental status changes, and worsening transaminitis. CT ABD/pelvis demonstrated a hematoma causing severe compression of the main portal vein and common bile duct. Decision was made to place an IR percutaneous bilary drain. Prior to placement patient had increased WOB and was intubated for the procedure. She was admitted to SICU on 2/12 post-procedure for close observation and sepsis management. She had septic shock that night and following day requiring large volume IVF resuscitation and norepinephrine infusion.     Interval History: Net neg 3.1 L with Diamox and lasix gtt restarted yesterday evening. Metabolic alkalosis resolved.     Past Med/Sx History:   Past Medical History:   Diagnosis Date    Acute kidney failure 03/31/2017    Cancer     Depression     Fibromyalgia     Hypothyroidism        Physical Exam by Systems:  See attending physician physical exam     Assessment and Plan for Active/Followed Hospital Problems:   Active Hospital Problems    Diagnosis    *!*Pancreatic adenocarcinoma s/p Whipple 03/29/17     -Whipple on 1/25 with Dr. Ron Agee  [facility] course c/b gastric outlet obstruction d/t severe compression of the main portal vein and common bile duct by hematoma, PTC drain 2/12 in IR  -Started on Zosyn 2/12  -Continue Decadron  -Continue TPN with SSI for glucose control        Sepsis     - Occurring in setting of biliary obstruction s/p PTC external/internal placement for source control  - BCx NGTD, Zosyn (2/12-) Vanco (2/12-2/14)  - Levo off   -  Goal SBP >90, MAP >60   - Tolerating diuresis      Acute respiratory failure     - Intubated 2/12 prior to [facility] placement for increased WOB  - Weaning ventilator as tolerated  - CXR 2/14 worsening bilateral pleural effusion,   - 2/15 Lasix gtt   - net neg 3.1 L last 24 hours  - PST daily - evaluate for extubation  - IS and Flutter valve once extubated       hx of Pulmonary embolism     - PE 01/18/17 on CT chest   - This hospitalization has been on therapeutic Lovenox, and Heparin gtt  - Currently holding therapeutic anticoagulation in setting of hematoma  - Heparin SQ TID      Acute blood loss anemia     - H/H 8.1/24, stable, no indication for transfusion  - Last transfusion 2/14  - Will monitor and transfuse prn        Cancer associated pain     Methadone at home  Pain management guided by patient's primary pain physician Dr Irven [place]  - methadone at 59m IV BID (09:00 & 21:00) and 7.5 mg IV qDay @ 13:00  - 2/15 increase methadone to 7.514mand 106m- QTc acceptable, monitor for prolongation  - Weaning fentanyl gtt and giving dilaudid PRN - consider PCA when appropriate        Depression     -Continue Elavil and Lexapro  Acute kidney injury     - Creatinine baseline ~0.8  - Diuresing last 48 hours with lasix and diamox  - Creatinine now 1.0, UN 39  - Will continue to monitor      Hypothyroidism     -Synthroid          Diagnoses for this hospitalization include:  Acute respiratory failure, sepsis        ---SICU Critical Care  Bundle reviewed with bedside nursing    Author: Peyton Najjar, NP  as of: 04/21/2017  at: 6:51 AM

## 2017-04-21 NOTE — Progress Notes (Signed)
Report Given To  Levada Dy, RN      Descriptive Sentence / Reason for Admission   PMHx: pancreatic adenocarcinoma    1/25 s/p open Whipple. She is being admitted to SICU for post-operative monitoring, notably hemodynamic status and post-op pain control.      Active Issues / Relevant Events   2/8: upper GI series done  2/12: Continued confusion/pain on CC5, blood/urine culture sent. CT abd done; hematoma near common bile duct found; IR for biliary drain. Intubated for inc RR. Transferred to [address]. 2L IVF for HoTn.   2/13N: 5.5 L for HoTN and Levo; A-line and CVC placed. DAYS: ECHO (bilateral pleural effusions), IV methadone restarted  2/14N: 2.5L p-lyte, levo off DAYS: PEEP weaned throughout the day, 1 uPRBC for H/H 6.9/21; MIVF d/c'd; Fi02 40% --> 100% d/t acute episode of hypoxia during repositioning in the evening.  2/15N: CXR done. Vent weaned from 100% at start of shift. Mediport de-accessed.  2/15D Methadone dose increased (plan to wean fentanyl). CXR showing worsening L pleural effusion. Lasix gtt initiated. VSS. Blood cultures positive for Klebsiella enterobacter.   2/16N: Fentanyl weaned. 54mEq K x3.   2/16D Tolerated PS for 3 hours on 50% 8/8. Lasix gtt off, diamox started  2/17: Lasix restarted  2/17 D: extubated to HFNC, OOB to chair, increased FiO2 for desat into high 80s, worked with PT, back to bed with Clarise Cruz lift, lethargic this afternoon (very delayed responses, arousable but falls back asleep quickly, needs lots of prompting/multiple requests to follow commands), ABG - pCO2 36, pO2 66. Hold evening dose of methadone.       To Do List   Goal: SBP > 90 or MAP > 60   Flush biliary drain BID w/ 5cc NS (2000 and 0800)   Hold 8 pm dose of methadone for lethargy     Please remember to complete care plan and education record at 0400 and 1600 hours      Anticipatory Guidance / Discharge Planning  [facility] Prevention: NGT, LIJ CVC, L brachial a-line, Mediport, PIV x2, biliary drain, Foley     Daughter updated at  bedside 2/17

## 2017-04-22 ENCOUNTER — Inpatient Hospital Stay: Payer: Medicare (Managed Care)

## 2017-04-22 LAB — ART GASES / WHOLE BLOOD PANEL
Base Excess, Arterial: 0 mmol/L (ref <=2)
CO2,ART (Calc): 25 mmol/L (ref 23–28)
CO: 0.2 %
FO2 Hb, Arterial: 94 % (ref 90–100)
Glucose,WB: 158 mg/dL — ABNORMAL HIGH (ref 60–99)
HCO3, Arterial: 24 mmol/L (ref 21–26)
Hemoglobin: 9.1 g/dL — ABNORMAL LOW (ref 11.2–15.7)
ICA @7.4,WB: 4.6 mg/dL — ABNORMAL LOW (ref 4.8–5.2)
ICA Uncorr,WB: 4.5 mg/dL
Lactate ART,WB: 1.2 mmol/L — ABNORMAL HIGH (ref 0.3–0.8)
Methemoglobin: 0.5 % (ref 0.0–1.0)
NA, WB: 134 mmol/L — ABNORMAL LOW (ref 135–145)
Potassium,WB: 3.2 mmol/L — ABNORMAL LOW (ref 3.4–4.7)
pCO2, Arterial: 34 mm Hg — ABNORMAL LOW (ref 35–46)
pH: 7.46 — ABNORMAL HIGH (ref 7.35–7.45)
pO2,Arterial: 77 mm Hg — ABNORMAL LOW (ref 80–100)

## 2017-04-22 LAB — COMPREHENSIVE METABOLIC PANEL
ALT: 109 U/L — ABNORMAL HIGH (ref 0–35)
AST: 40 U/L — ABNORMAL HIGH (ref 0–35)
Albumin: 2.7 g/dL — ABNORMAL LOW (ref 3.5–5.2)
Alk Phos: 285 U/L — ABNORMAL HIGH (ref 35–105)
Anion Gap: 16 (ref 7–16)
Bilirubin,Total: 0.7 mg/dL (ref 0.0–1.2)
CO2: 24 mmol/L (ref 20–28)
Calcium: 8.4 mg/dL — ABNORMAL LOW (ref 8.6–10.2)
Chloride: 97 mmol/L (ref 96–108)
Creatinine: 1.11 mg/dL — ABNORMAL HIGH (ref 0.51–0.95)
GFR,Black: 58 * — AB
GFR,Caucasian: 50 * — AB
Glucose: 155 mg/dL — ABNORMAL HIGH (ref 60–99)
Lab: 52 mg/dL — ABNORMAL HIGH (ref 6–20)
Potassium: 3.7 mmol/L (ref 3.3–5.1)
Sodium: 137 mmol/L (ref 133–145)
Total Protein: 5.8 g/dL — ABNORMAL LOW (ref 6.3–7.7)

## 2017-04-22 LAB — CBC AND DIFFERENTIAL
Baso # K/uL: 0 10*3/uL (ref 0.0–0.1)
Basophil %: 0 %
Eos # K/uL: 0 10*3/uL (ref 0.0–0.4)
Eosinophil %: 0 %
Hematocrit: 26 % — ABNORMAL LOW (ref 34–45)
Hemoglobin: 8.7 g/dL — ABNORMAL LOW (ref 11.2–15.7)
Lymph # K/uL: 0.6 10*3/uL — ABNORMAL LOW (ref 1.2–3.7)
Lymphocyte %: 4.2 %
MCH: 29 pg/cell (ref 26–32)
MCHC: 34 g/dL (ref 32–36)
MCV: 87 fL (ref 79–95)
Mono # K/uL: 0.3 10*3/uL (ref 0.2–0.9)
Monocyte %: 1.7 %
Neut # K/uL: 14.4 10*3/uL — ABNORMAL HIGH (ref 1.6–6.1)
Nucl RBC # K/uL: 0 10*3/uL (ref 0.0–0.0)
Nucl RBC %: 0 /100 WBC (ref 0.0–0.2)
Platelets: 353 10*3/uL (ref 160–370)
RBC: 3 MIL/uL — ABNORMAL LOW (ref 3.9–5.2)
RDW: 18 % — ABNORMAL HIGH (ref 11.7–14.4)
Seg Neut %: 90.8 %
WBC: 15.5 10*3/uL — ABNORMAL HIGH (ref 4.0–10.0)

## 2017-04-22 LAB — DIFF MANUAL
Bands %: 2 % (ref 0–10)
Blasts %: 1 % — ABNORMAL HIGH (ref 0–0)
Diff Based On: 118 CELLS
Metamyelocyte %: 1 % (ref 0–1)

## 2017-04-22 LAB — PROTIME-INR
INR: 1.2 — ABNORMAL HIGH (ref 0.9–1.1)
Protime: 13.1 s — ABNORMAL HIGH (ref 10.0–12.9)

## 2017-04-22 LAB — POCT GLUCOSE
Glucose POCT: 103 mg/dL — ABNORMAL HIGH (ref 60–99)
Glucose POCT: 104 mg/dL — ABNORMAL HIGH (ref 60–99)
Glucose POCT: 128 mg/dL — ABNORMAL HIGH (ref 60–99)
Glucose POCT: 135 mg/dL — ABNORMAL HIGH (ref 60–99)

## 2017-04-22 LAB — MAGNESIUM: Magnesium: 2 mg/dL (ref 1.6–2.5)

## 2017-04-22 LAB — HEMATOPATHOLOGY REVIEW

## 2017-04-22 LAB — PHOSPHORUS: Phosphorus: 5.5 mg/dL — ABNORMAL HIGH (ref 2.7–4.5)

## 2017-04-22 LAB — APTT: aPTT: 29.3 s (ref 25.8–37.9)

## 2017-04-22 LAB — BLOOD CULTURE

## 2017-04-22 MED ORDER — METHADONE HCL 10 MG/ML IJ SOLN *I*
5.0000 mg | Freq: Every day | INTRAMUSCULAR | Status: DC
Start: 2017-04-22 — End: 2017-04-22
  Filled 2017-04-22 (×2): qty 0.5

## 2017-04-22 MED ORDER — METHADONE HCL 10 MG/ML IJ SOLN *I*
5.0000 mg | Freq: Every day | INTRAMUSCULAR | Status: DC
Start: 2017-04-22 — End: 2017-04-22

## 2017-04-22 MED ORDER — METHADONE HCL 10 MG/ML IJ SOLN *I*
5.0000 mg | Freq: Two times a day (BID) | INTRAMUSCULAR | Status: DC
Start: 2017-04-22 — End: 2017-04-22

## 2017-04-22 MED ORDER — HYDROMORPHONE HCL 2 MG/ML IJ SOLN *WRAPPED*
0.5000 mg | INTRAMUSCULAR | Status: AC | PRN
Start: 2017-04-22 — End: 2017-04-25
  Administered 2017-04-22 – 2017-04-25 (×13): 0.5 mg via INTRAVENOUS
  Filled 2017-04-22 (×13): qty 1

## 2017-04-22 MED ORDER — METHADONE HCL 10 MG/ML IJ SOLN *I*
5.0000 mg | Freq: Two times a day (BID) | INTRAMUSCULAR | Status: DC
Start: 2017-04-22 — End: 2017-04-22
  Filled 2017-04-22 (×3): qty 0.5

## 2017-04-22 MED ORDER — STERILE WATER FOR INJECTION (TPN USE ONLY) *I*
INTRAVENOUS | Status: AC
Start: 2017-04-22 — End: 2017-04-23
  Filled 2017-04-22: qty 840

## 2017-04-22 MED ORDER — STERILE WATER FOR IRRIGATION IR SOLN *I*
900.0000 mL | Freq: Once | Status: AC
Start: 2017-04-22 — End: 2017-04-22
  Administered 2017-04-22: 900 mL via ORAL

## 2017-04-22 MED ORDER — FAT EMULSION SOYBEAN OIL 20 % IV EMUL *WRAPPED*
60.0000 g | Freq: Every day | INTRAVENOUS | Status: DC
Start: 2017-04-22 — End: 2017-04-22
  Filled 2017-04-22: qty 500

## 2017-04-22 MED ORDER — STERILE WATER FOR INJECTION (TPN USE ONLY) *I*
INTRAVENOUS | Status: DC
Start: 2017-04-22 — End: 2017-04-22
  Filled 2017-04-22: qty 840

## 2017-04-22 MED ORDER — FAT EMULSION SOYBEAN OIL 20 % IV EMUL *WRAPPED*
60.0000 g | Freq: Every day | INTRAVENOUS | Status: AC
Start: 2017-04-22 — End: 2017-04-23
  Administered 2017-04-22 – 2017-04-23 (×11): 60 g via INTRAVENOUS
  Filled 2017-04-22: qty 300

## 2017-04-22 NOTE — Progress Notes (Signed)
Physical Therapy Attempt Note      Attempted to see pt for PT today, however she was very lethargic and then off the unit for CT. Will continue to follow and attempt again as time allows.        Please page with any questions or concerns.    Robby Sermon, PT, DPT  Pager [phone]

## 2017-04-22 NOTE — Progress Notes (Signed)
SICU attending addendum:  Pt seen, examined, and evaluated on multidisciplinary rounds with SICU team.     Ms. Bedgood is a 72 y F with [place] significant to fibromyalgia and pancreatic cancer on methadone for pain management who is now s/p whipple 03/29/17. Her intraoperative and immediate post-operative course were uncomplicated with a brief SICU admission postop for pain management, discharged to floor 1/28. 1/31 her NGT was replaced for nausea and vomiting with concern for gastric outlet obstruction. 2/12 pt developed mental status changes, tachypnea and worsening transaminitis. CT abdomen showed a hematoma compressing portal vein and CBD. She was taken to IR for percutaneous biliary drain. She required intubation for respiratory failure prior to the procedure and was readmitted to SICU postop for management. She developed septic shock requiring fluid resuscitation and pressors. Her sepsis improved and she was weaned from pressors and extubated to HFNC 2/17 am.     Overnight she was somnolent, unable to follow commands or participate in pulmonary toilet. Her methadone is being held.     Physical Exam by Systems:  Gen: awake in bed, NAD  HEENT: NcAT, Pupils 43m ERRLA, neck supple  Cardiac: RRR, no MRG  Pulm: coarse BS b/l, improves with cough, minimal volumes on IS (<500cc)  Abdom: S, minimal tenderness to palpation, incisions CDI, healing without induration or erythema  Extr: WWP, +1 edema to knees  Neuro: oriented x3, awake, follows commands all extremities    - continue to hold methadone and reassess mental status  - encourage pulmonary toilet, OOB, PT  - CT with PO contrast today to evaluate gastric outlet obstruction as NG output decreasing  - wean HFNC as tolerated  - hold lasix today, pt clinically with improved volume status (edema, B lines on lung ultrasound) with signs of volume contraction on overnight labs.     The Problem List was discussed and all plans reviewed by me. I agree with the findings and plan  above.     Plan discussed with colorectal surgery.    This patient is critically ill with at least 71 organ system failure associated with a high probability of imminent or life threatening deterioration. the care I have delivered involved high complexity decision making to assess, manipulate, and support vital system function(s), to treat the vital organ system failure and/or prevent further life threatening deterioration of the patient's condition. All nursing documentation, laboratory data, test results, and radiographs were reviewed and interpreted by me. I have established the management plan for this patient's critical illness and have been immediately available to assist with patient care. my documented total critical care time reflects my own patient care time and does not include teaching or procedure time.   critical care time: I spent 30 minutes personally attending to this patient's critical care needs. This critical care time is exclusive of any time for separately billable procedures.      Signed: LDarin EngelsMD, 04/22/17 @ 1407      SICU Progress Note    SOliverCullenis a 72y.o.female with PMHx PE,pancreatic adenocarcinoma s/p uncomplicated Whipple 13/08with Dr GRon Agee Briefly admitted to SICU post-operatively for pain control, on home methadone for chronic cancer pain. Discharged to the floor 1/28. Developed n/v beginning 1/31, NGT placed with continued high output and concern for gastric outlet obstruction distal to the gastro-jejunostomy site. On 2/12 developed tachypnea, mental status changes, and worsening transaminitis. CT ABD/pelvis demonstrated a hematoma causing severe compression of the main portal vein and common bile duct. Decision was made  to place an IR percutaneous bilary drain. Prior to placement patient had increased WOB and was intubated for the procedure. She was admitted to SICU on 2/12 post-procedure for close observation and sepsis management. She had septic shock that  night and following day requiring large volume IVF resuscitation and norepinephrine infusion.     Interval History: HFNC, Lasix gtt stopped for HoTN 88/44, Methadone held for sedation, Fent gtt stopped,      Past Med/Sx History:   Past Medical History:   Diagnosis Date    Acute kidney failure 03/31/2017    Cancer     Depression     Fibromyalgia     Hypothyroidism        Physical Exam by Systems:  Constitutional: Somnolent  HEENT: HFNC, [place] breath sounds bilaterally  Extremities: edematous BLE    Assessment and Plan for Active/Followed Hospital Problems:   Active Hospital Problems    Diagnosis    *!*Pancreatic adenocarcinoma s/p Whipple 71/25/19     -Whipple on 1/25 with Dr. Ron Agee  [facility] course c/b gastric outlet obstruction d/t severe compression of the main portal vein and common bile duct by hematoma, PTC drain 2/12 in IR  - Zosyn 2/12 - 2/18  - stop decadron Decadron  -Continue TPN with SSI for glucose control        Sepsis     - Occurring in setting of biliary obstruction s/p PTC external/internal placement for source control  - BCx 2/12 Enterobacter 72hrs to growth, Zosyn (2/12-2/18) Vanco (2/12-2/14)  - Levo off   - Goal SBP >90, MAP >60         Acute respiratory failure     - Intubated 2/12 prior to [facility] placement for increased WOB  - Extubated 2/17  - HFNC  - Lasix gtt started 2/15,  24 hours net - 5L, UOP 7.4L, Will hold lasix until tomorrow  - IS and Flutter valve   - Chest PT       hx of Pulmonary embolism     - PE 01/18/17 on CT chest   - This hospitalization has been on therapeutic Lovenox, and Heparin gtt  - Currently holding therapeutic anticoagulation in setting of hematoma  - Heparin SQ TID      Acute blood loss anemia     - H/H 9.1/26, stable  - Last transfusion 2/14  - Will monitor and transfuse prn        Cancer associated pain     Methadone at home  Pain management guided by patient's primary pain physician Dr Irven [place]  - methadone at 66m IV BID (09:00 & 21:00) and 7.5 mg IV  qDay @ 13:00  - 2/15 increase methadone to 7.571mand 1033m- QTc acceptable, monitor for prolongation  - consider PCA when appropriate    - Held methadone for sedation, will restart 5 mg IV this afternoon.         Depression     -Continue Elavil and Lexapro      Acute kidney injury     - Creatinine baseline ~0.8  - Cr 1.1 (1.1), BUN 52 (45), K 3.7  - Lasix stopped 2/17 evening for HoTN, will likely restart tomorrow.       Hypothyroidism     -Synthroid          Diagnoses for this hospitalization include:  Acute respiratory failure, sepsis        ---SICU Critical Care  Bundle reviewed with bedside nursing    Author:  Greg Cutter, MD  as of: 04/22/2017  at: 8:12 AM

## 2017-04-22 NOTE — Consults (Signed)
Medical Nutrition Therapy - Work Up    Admit Date: 03/29/2017    Patient Summary: [name] with PMH pulmonary embolism and as noted below, dx pancreatic adenocarcinoma s/p FOLFIRINOX chemo with neoadjuvant SBRT (see Oncology notes for full hx), admitted for Whipple completed 03/29/17; post-op course c/b concern for GOO distal to the gastro-jejunostomy site. CT showing hematoma with main portal vein and common bile duct compression. TPN started 04/09/17. Pt transferred to SICU 04/16/17 for severe biliary sepsis, septic shock, shock liver, acute respiratory failure, acute blood loss anemia, pain r/t cancer, and hypotension. S/p IR percutaneous bilary drain placed 04/16/17.  Pt extubated 2/17, on HFNC.     Past Medical History:   Diagnosis Date    Acute kidney failure 03/31/2017    Cancer     Depression     Fibromyalgia     Hypothyroidism      Past Surgical History:   Procedure Laterality Date    CHOLECYSTECTOMY      CHOLECYSTECTOMY, LAPAROSCOPIC  09/06/2016    HYSTERECTOMY  08/1986    Fibroids    KNEE SURGERY Right     PR INSERT TUNNELED CV CATH WITH PORT Right 10/04/2016    Procedure: Right IJ MEDIPORT Insertion;  Surgeon: Delano Metz, MD;  Location: [facility] MAIN OR;  Service: Oncology General    PR LAP,DIAGNOSTIC ABDOMEN N/A 10/04/2016    Procedure: LAPAROSCOPY DIAGNOSTIC;  Surgeon: Delano Metz, MD;  Location[facility] MAIN OR;  Service: Oncology General    PR PART [place] PANC,PROX+REMV DUOD+ANAST N/A 03/29/2017    Procedure: WHIPPLE PROCEDURE;  Surgeon: Delano Metz, MD;  Location: [facility] MAIN OR;  Service: Oncology General    ROTATOR CUFF REPAIR Right     TONSILLECTOMY AND ADENOIDECTOMY       Pertinent Social Hx:  Divorced,resides in [place], [place].  Supportive son and daughter.     Pertinent Meds: reviewed, of note:    Scheduled Meds:   methadone IV  5 mg Intravenous Daily    methadone IV  5 mg Intravenous BID    insulin regular  0-20 Units Subcutaneous Q6H    pantoprazole  40 mg Intravenous Q24H    piperacillin-tazobactam   3.375 g Intravenous Q6H    levothyroxine  137 mcg Oral Daily    amitriptyline  25 mg Oral Nightly    escitalopram  20 mg Oral Nightly   Continuous Infusions:   TPN (2 in 1) Adult Continuous      And    fat emulsion soybean oil      TPN (2 in 1) Adult Continuous 75 mL/hr at 04/22/17 0659   PRN Meds:.HYDROmorphone PF, ondansetron, naloxone    Pertinent Labs: reviewed    UN high 52, creatinine high 1.11, GFR low 50; worsening renal fx labs (had been on lasix drip, now stopped)   BGs high 155-175, insulin coverage and protocol in place   Corrected ICA low 4.6; last repletion outside of TPN was 2/14   Phos high 5.5 (new onset): K+ stable, WNL, last outside repletion was on 2/16   ALT high 109, AST high 40, alk phos high 285; t bili WNL; continuing, significant improvement in LFTs   Anemic, MCV WNL; anemia panel not checked.  TPN provides J88 and folic acid but not iron.  Pt receiving intermittent transfusion support.  RBCs last given on 2/14 per I&Os.    Vit D level not checked.  TPN does not provide full DRI of vit D.     Vitals: Blood  pressure 100/70, pulse 79, temperature 35.9 C (96.6 F), temperature source Temporal, resp. rate 18, height 1.702 m ('5\' 7"' ), weight 73.5 kg (162 lb), SpO2 97 %.    Reviewed I/O's 2/18   High (7L) UOP (had been receiving lasix drip)   440 ml biliary drain output   150 ml NG output   No BMs    I/O last 3 days:  02/15 1500 - 02/18 1459  In: 7423 (101 mL/kg) [I.V.:247.2; Other:25; NG/GT:705; IV Piggyback:731.7]  Out: 22690 (308.8 mL/kg) [UKGUR:42706; Emesis/NG output:1705]  Net: -15267  Weight: 73.5 kg     Access:  PIV, triple lumen PCVC    Nutrition Hx:    Pt reported she was primarily only eating cottage cheese with mandarin oranges prior to admission. She would have have this 4x daily and drink Ensure (believes it was Ensure plus) ~TID. She stated she didn't eat much else for weeks. Reports that eating was "awful" after the surgery and had only been able to eat very  small amounts.   NPO since 1/31; TPN started 04/09/17 (10 days after admission)     Food allergies: NKFA    Current diet: NPO   Supplements: none    Nutrition Focused Physical Exam:  Edema: 2+ general, BUE, BLE per shift assessment  Abdomen: distended, hypoactive BS per shift assessment  Skin: bruising, abdominal surgical wound per shift assessment    Anthropometrics:  Height: 170.2 cm ('5\' 7"' )    Weight: 73.5 kg (162 lb)  Ideal Body Weight: 72.4 kg + 10%  Weight is 101.5 % of IBW  BMI (Calculated): 25.4 kg/(m^2) healthy range for age  Weight Hx: stable x 5 months PTA; edema may be masking acute weight loss   04/17/2017 73.483 kg --with 1+ general, BUE, BLE edema    04/09/2017 73.483 kg Actual   03/29/2017 72 kg Actual admit weight with 1+ general, BLE edema    03/22/2017 71.215 kg --   03/11/2017 73 kg  --No edema per Surgical Oncology clinic note    03/06/2017 72.349 kg --   03/01/2017 73.755 kg --   02/19/2017 72.576 kg Actual   01/29/2017 73.573 kg Actual   01/28/2017 73 kg Actual   12/14/2016 73.256 kg --   11/16/2016 69.899 kg --   10/30/2016 67.586 kg Actual   10/05/2016 71.3 kg Actual     Estimated Nutrient Needs: (Based on admit weight 72 kg)    1800-2160 kcal/day (25-30 kcal/kg)   110-145 g protein/day (1.5-2.0 g/kg)    1800-2160 mL fluid/day (25-30 mL/kg) or per team recs     Nutrition Assessment and Diagnosis:   TPN is ordered for tonight as follows:  2-in-1 TPN with lipids via adult central line; amino acid-dextrose solution @ 75 ml/hr x 24 hrs (1800 ml fluid per day), with:   Amino acids 70 g/L  Dextrose 150 g/L   Na+ 100 meq/L  K+ 30 meq/L  Ca++ 9 meq/L  Mg 5 meq/L  P 12 mmole/L  Standard MVI  Standard trace elements  1 mg folic acid  237 mg thiamine  Chloride: Acetate ratio- max chloride  AND: 60 g/day lipids (300 ml/day 20% soy lipid emulsion, given over 12 hours)  TPN with lipids provides a total of 2100 ml fluid, 2022 kcal, 126 g protein and 270 g dextrose per day.     BGs improved.  Pt with new onset  hyperphosphatemia.  K+ levels remain WNL; she has not required any outside repletion of potassium since 2/16.  UN, creatinine increased.  Anticipate that hypophosphatemia and renal fx will improve with d/c of lasix drip.      LFTs are significantly improved.  No need to change to SMOF lipids at this time.  Outputs have remained fairly low; will monitor for need to adjust sodium in TPN.  For now, would continue at current level; serum Na+ is WNL.     Malnutrition Status: Pt with evidence of moderate malnutrition per nutrition note of 04/09/17; pt has received PN support since 04/09/17.      Nutrition Intervention:   1. TPN Initiation  The Clinical Nutrition Specialist agrees TPN is appropriate for this patient. Most recently reviewed at [place] on 04/17/17.  Original Start Date: 2/5  D/C Plan:  TPN will be discontinued once GOO resolves     2.  For Tuesday 04/23/17: recommend 2-in-1 TPN with lipids, via adult central line; amino acid dextrose solution @ 75 ml/hr x 24 hrs (1800 ml fluid per day), with: (changes in bold)  Amino acids 70 g/L  Dextrose 150 g/L   Na+ 100 meq/L  K+ 30 meq/L  Ca++ 9 meq/L  Mg 5 meq/L  P: If serum phos level remains high, reduce to 9 mmole/L   Standard MVI  Standard trace elements  Chloride: Acetate ratio- max chloride.  AND: 60 g/day lipids (300 ml/day 20% soy lipid emulsion, given over 12 hours)  TPN with lipids will provide a total of 2100 ml fluid, 2022 kcal, 126 g protein and 270 g dextrose/day.     3.  Please continue parenteral monitoring panel. Recommended Monitoring Parameters:               PO4, Mg, and basic metabolic panel daily for 3 days or until stable, then 3 times per week. Replete lytes via IV as needed.                Comprehensive metabolic panel and triglycerides weekly. Hold lipids if TG>400.                Check BGs q 6 hours until stable on goal dextrose.                Monitor weight daily until fluid balance is stable, then weekly.               Accurate I&Os.      4.   Consider checking vit D3 level.  TPN does not provide full DRI of vit D.  Renal/liver injuries increase risk of deficiency.      Nutrition Monitoring/Evaluation:   1. Will monitor TPN tolerance, nutrition-related labs, weight trend, BM pattern.  2. Nutrition to follow up per high nutrition risk protocol.    Serita Butcher, RD, [place] pager# [phone]

## 2017-04-22 NOTE — Progress Notes (Signed)
Report Given To  Nira Conn, RN      Descriptive Sentence / Reason for Admission   PMHx: pancreatic adenocarcinoma    1/25 s/p open Whipple. She is being admitted to SICU for post-operative monitoring, notably hemodynamic status and post-op pain control.      Active Issues / Relevant Events   2/8: upper GI series done  2/12: Continued confusion/pain on CC5, blood/urine culture sent. CT abd done; hematoma near common bile duct found; IR for biliary drain. Intubated for inc RR. Transferred to [address]. 2L IVF for HoTn.   2/13N: 5.5 L for HoTN and Levo; A-line and CVC placed. DAYS: ECHO (bilateral pleural effusions), IV methadone restarted  2/14N: 2.5L p-lyte, levo off DAYS: PEEP weaned throughout the day, 1 uPRBC for H/H 6.9/21; MIVF d/c'd; Fi02 40% --> 100% d/t acute episode of hypoxia during repositioning in the evening.  2/15N: CXR done. Vent weaned from 100% at start of shift. Mediport de-accessed.  2/15D Methadone dose increased (plan to wean fentanyl). CXR showing worsening L pleural effusion. Lasix gtt initiated. VSS. Blood cultures positive for Klebsiella enterobacter.   2/16N: Fentanyl weaned. 44mEq K x3.   2/16D Tolerated PS for 3 hours on 50% 8/8. Lasix gtt off, diamox started  2/17: Lasix restarted  2/17 D: extubated to HFNC, OOB to chair, increased FiO2 for desat into high 80s, worked with PT, back to bed with Clarise Cruz lift, lethargic this afternoon (very delayed responses, arousable but falls back asleep quickly, needs lots of prompting/multiple requests to follow commands), ABG - pCO2 36, pO2 66. Hold evening dose of methadone.   2/18N: Lethargic; held nightly dose of Methadone      To Do List   Goal: SBP > 90 or MAP > 60   Flush biliary drain BID w/ 5cc NS (2000 and 0800)    Please remember to complete care plan and education record at 0400 and 1600 hours      Anticipatory Guidance / Discharge Planning  [facility] Prevention: NGT, LIJ CVC, L brachial a-line, Mediport (de-accessed) , PIV x1,, biliary drain, Foley      Daughter updated at bedside 2/17

## 2017-04-22 NOTE — Plan of Care (Signed)
Impaired Gas Exchange     Patient will maintain adequate oxygenation Maintaining    Pt extubated 2/18 on HFNC 50% 30L     Post-Operative Complications     Patient will remain free from symptoms of infection-post op Maintaining        Post-Operative Hemodynamic Stability     Maintain Hemodynamic Stability Maintaining    VSS     Safety     Patient will remain free of falls Maintaining    Pt OOBTC & remained free of falls using the [place]       Bowel Elimination     Elimination patterns are normal or improving Progressing towards goal        Cognitive function     Cognitive function will be maintained Progressing towards goal    Pt in a lethargic and drowsy state of mind, Pt intermittently disoriented to time or place    Mobility     Functional status is maintained or improved - Geriatric Progressing towards goal        Nutrition     Patient's nutritional status is maintained or improved Progressing towards goal    Pt receiving TPN and lipids     Pain/Comfort     Patient's pain or discomfort is manageable Progressing towards goal    Pt's nightly dose of pain medication (Methadone) held for orientation status (lethargic)     Post-Operative Bowel Elimination     Elimination pattern is normal or improving Progressing towards goal

## 2017-04-22 NOTE — Progress Notes (Addendum)
Surgical Oncology/Hepatobiliary Surgery Progress Note     LOS: 24 days     Subjective:    Extubated yesterday. Remains on HFNC.  Net -5.5L/24hrs.  Lasix gtt stopped. Cr/BUN slightly increased at 1.11/52.  NGT output only 136m (529m  Biliary tube output [id number] this morning. Slow to answer questions. +flatus.    Objective:    Vitals Sign Ranges for Past 24 Hours:  Temp:  [35.9 C (96.6 F)-37 C (98.6 F)]   Temp src: Temporal (02/18 0800)  Heart Rate:  [69-86]   Resp:  [8-22]   SpO2:  [91 %-97 %]       Physical Exam:    General Appearance: no distress, appears uncomfortable  HEENT: NGT with light brown gastric fluid output  Cardiac: regular rate  Respiratory: mechanically ventilated  Abdomen: edematous, no apparent tenderness to palpation, biliary drain with light brown bilious output  Extremities: warm    Labs:    CBC:    Recent Labs  Lab 04/21/17  2349 04/21/17  2348  04/21/17  0005  04/19/17  2329  04/19/17  0017  04/17/17  2344  04/17/17  0044   WBC  --  15.5*  --  15.5*  --  22.1*  --  22.7*  --  21.2*  --  9.9   Hemoglobin 9.1* 8.7*  < > 8.1*   8.3*  < > 8.2*  < > 7.7*   7.9*  < > 6.9*  < > 6.8*   7.3*   Hematocrit  --  26*  --  24*  --  24*  --  22*  --  21*  --  21*   Platelets  --  353  --  309  --  335  --  298  --  384*  --  309   < > = values in this interval not displayed.    Metabolic Panel:    Recent Labs  Lab 04/21/17  2348 04/21/17  1201 04/21/17  0005 04/20/17  1210 04/19/17  2329 04/19/17  0017 04/17/17  2344 04/17/17  1807  04/17/17  0044   Sodium 137 137 137 137 138 135 132*  --   < > 133   Potassium 3.7 3.7 4.0 3.7 3.4 3.9 4.0  --   < > 3.8   Chloride 97 96 97 96 97 99 97  --   < > 97   CO2 _0 30* _1 --   < > 21   UN 52* 45* 39* 28* 24* 22* 22*  --   < > 20   Creatinine 1.11* 1.10* 1.00* 1.00* 0.85 0.80 0.83  --   < > 0.95   Glucose 155* 143* 165* 111* 135* 187* 224*  --   < > 74   Calcium 8.4* 8.8 8.6 9.1 8.8 8.9 7.8*  --   < > 7.9*   Magnesium 2.0  --  1.8  --  1.6  2.2 1.9  --   --  1.7   Phosphorus 5.5*  --  4.5  --  3.0 3.1 3.1 2.7  --  1.2*   < > = values in this interval not displayed.       Assessment:    71 y51. female with h/o recent PE and pancreatic cancer POD # 24  status post whipple procedure complicated by delayed gastric emptying.  NGT replaced on 04/04/17. UGI on 04/12/17 concerning for obstruction just beyond the  GJ in the efferent limb.  Course complicated on 7/62/83 by rising LFTs and sepsis with CT concerning for biliary tree obstruction and PV compression due to hematoma and afferent limb distention in setting of PE treatment. Is s/p IR PTC 04/16/17.      Plan:    - NPO, TPN  - NGT to LIWS  - PTC/biliary drain to gravity  - IV Zosyn x 7 days for cholangitis (set to end 2/19)  - Previously planning EGD to eval efferent limb obstruction, but NGT output has been low - will discuss with attending  - SCDs, heparin SC (hold therapeutic anticoagulation) for VTE ppx  - Appreciate SICU care      Donata Duff, MD  04/22/2017     8:30 AM  General Surgery Resident    HPB-GI Attending Addendum:   I personally examined the patient, reviewed the notes, and discussed the plan of care with the residents and patient. Agree with detailed resident's note. Please see the note above for details of history, exam, labs, assessment/plan which reflect my input.     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      Gastrojejunostomy obstruction improving. Continue nasogastric tube. Monitor output. Replete as necessary. Metoclopramide.   afferent limb bowel obstruction due to hematoma.   Hyperglycemia without need for insulin. Monitor.   Post operative anemia. No evidence of bleeding. Dilutional.   Volume overload  Hypoalbuminemia. Malnutrition.   Somnolence. Possible methadone excess. Holding. No evidence of untreated infection. Holding methadone and pain medications for now.    Post operative anemia. Stable. Monitor.   Leukocytosis with resolving bandemia from cholangitis with bacteremia. Continue antibiotics for 14 days.   CT scan to assess for resolution of obstruction and hematoma.      Delano Metz, MD, FACS  Hepatobiliary, Pancreas & GI Surgery

## 2017-04-22 NOTE — Progress Notes (Signed)
Report Given To  Levada Dy, RN      Descriptive Sentence / Reason for Admission   PMHx: pancreatic adenocarcinoma    1/25 s/p open Whipple. She is being admitted to SICU for post-operative monitoring, notably hemodynamic status and post-op pain control.      Active Issues / Relevant Events   2/8: upper GI series done  2/12: Continued confusion/pain on CC5, blood/urine culture sent. CT abd done; hematoma near common bile duct found; IR for biliary drain. Intubated for inc RR. Transferred to [address]. 2L IVF for HoTn.   2/13N: 5.5 L for HoTN and Levo; A-line and CVC placed. DAYS: ECHO (bilateral pleural effusions), IV methadone restarted  2/14N: 2.5L p-lyte, levo off DAYS: PEEP weaned throughout the day, 1 uPRBC for H/H 6.9/21; MIVF d/c'd; Fi02 40% --> 100% d/t acute episode of hypoxia during repositioning in the evening.  2/15N: CXR done. Vent weaned from 100% at start of shift. Mediport de-accessed.  2/15D Methadone dose increased (plan to wean fentanyl). CXR showing worsening L pleural effusion. Lasix gtt initiated. VSS. Blood cultures positive for Klebsiella enterobacter.   2/16N: Fentanyl weaned. 22mEq K x3.   2/16D Tolerated PS for 3 hours on 50% 8/8. Lasix gtt off, diamox started  2/17: Lasix restarted  2/17 D: extubated to HFNC, OOB to chair, increased FiO2 for desat into high 80s, worked with PT, back to bed with Clarise Cruz lift, lethargic this afternoon (very delayed responses, arousable but falls back asleep quickly, needs lots of prompting/multiple requests to follow commands), ABG - pCO2 36, pO2 66. Hold evening dose of methadone.   2/18N: Lethargic; held nightly dose of Methadone  2/18D: Methadone d/c'ed for lethargy. Pulm toilet with bed percussion. D/C'ed zosyn. CT for possible obstruction.       To Do List   Goal: SBP > 90 or MAP > 60   Flush biliary drain BID w/ 5cc NS (2000 and 0800)    Please remember to complete care plan and education record at 0400 and 1600 hours      Anticipatory Guidance / Discharge  Planning  [facility] Prevention: NGT, LIJ CVC, L brachial a-line, Mediport (de-accessed) , PIV x1, biliary drain, Foley     Daughter updated at bedside 2/18

## 2017-04-22 NOTE — Progress Notes (Incomplete)
---  Critical Care Bundle--    Fluids & Electrolytes: replace as needed     Nutrition / last BM: NPO, TPN @75 /hr    Mobility plan: OOB     Neuro / sedation / pain / sedation holiday: Methadone 7.5mg  (0800, 2000), Methadone 10mg  (1300) (decreasing), Dilaudid 1mg  Q1 PRN    Respiratory weaning (and PST, TC) / pulmonary toilet: HFNC rate 30, FiO2 50- IS    Delirium / sleep / prevention / intervention: CAM +    List of antimicrobials: Zosyn Q6 (stopping)    Drains / Lines / Tubes: NG, L IJ TLC, x1 PIV, Foley, Biliary drain, mediport (deaccess)    Indication for foley: strict I&O's    DVT prophylaxis / a PTT: Heparin Q8    Indications for gastric acid suppression: Protonix 40mg  Q24    Labs ordered: Daily: ABG, CBC and diff, CMP, Mg, Phos, aPTT, PT-INR  Q12: BMP  T/TH/SAT: Triglycerides  PRN: ABG    Family concern / updates: updated 2/17    Patient likes to be called: [name]     Code status: Full     Disposition / Level: ICU    Nursing reiterate plan for day: Y/N          Author: Lyndle Herrlich, RN  as of: 04/22/2017  at: 7:27 AM

## 2017-04-22 NOTE — Plan of Care (Signed)
Bowel Elimination     Elimination patterns are normal or improving Goal not met    Hypoactive bowel sounds, no BM in over a week. Provider aware      Safety     Patient will remain free of falls Maintaining          Mobility     Functional status is maintained or improved - Geriatric Progressing towards goal        Pain/Comfort     Patient's pain or discomfort is manageable Progressing towards goal

## 2017-04-23 ENCOUNTER — Inpatient Hospital Stay: Payer: Medicare (Managed Care)

## 2017-04-23 DIAGNOSIS — D72829 Elevated white blood cell count, unspecified: Secondary | ICD-10-CM

## 2017-04-23 LAB — ART GASES / WHOLE BLOOD PANEL
Base Excess, Arterial: -4 mmol/L — ABNORMAL LOW (ref <=2)
Base Excess, Arterial: -4 mmol/L — ABNORMAL LOW (ref <=2)
CO2,ART (Calc): 20 mmol/L — ABNORMAL LOW (ref 23–28)
CO2,ART (Calc): 20 mmol/L — ABNORMAL LOW (ref 23–28)
CO: 0 %
CO: 0.1 %
FO2 Hb, Arterial: 96 % (ref 90–100)
FO2 Hb, Arterial: 97 % (ref 90–100)
Glucose,WB: 131 mg/dL — ABNORMAL HIGH (ref 60–99)
Glucose,WB: 135 mg/dL — ABNORMAL HIGH (ref 60–99)
HCO3, Arterial: 19 mmol/L — ABNORMAL LOW (ref 21–26)
HCO3, Arterial: 20 mmol/L — ABNORMAL LOW (ref 21–26)
Hemoglobin: 9.4 g/dL — ABNORMAL LOW (ref 11.2–15.7)
Hemoglobin: 9.8 g/dL — ABNORMAL LOW (ref 11.2–15.7)
ICA @7.4,WB: 4.5 mg/dL — ABNORMAL LOW (ref 4.8–5.2)
ICA @7.4,WB: 4.7 mg/dL — ABNORMAL LOW (ref 4.8–5.2)
ICA Uncorr,WB: 4.5 mg/dL
ICA Uncorr,WB: 4.6 mg/dL
Lactate ART,WB: 1.1 mmol/L — ABNORMAL HIGH (ref 0.3–0.8)
Lactate ART,WB: 1.1 mmol/L — ABNORMAL HIGH (ref 0.3–0.8)
Methemoglobin: 0.4 % (ref 0.0–1.0)
Methemoglobin: 0.4 % (ref 0.0–1.0)
NA, WB: 128 mmol/L — ABNORMAL LOW (ref 135–145)
NA, WB: 132 mmol/L — ABNORMAL LOW (ref 135–145)
Potassium,WB: 3.1 mmol/L — ABNORMAL LOW (ref 3.4–4.7)
Potassium,WB: 3.4 mmol/L (ref 3.4–4.7)
pCO2, Arterial: 28 mm Hg — ABNORMAL LOW (ref 35–46)
pCO2, Arterial: 30 mm Hg — ABNORMAL LOW (ref 35–46)
pH: 7.43 (ref 7.35–7.45)
pH: 7.45 (ref 7.35–7.45)
pO2,Arterial: 104 mm Hg — ABNORMAL HIGH (ref 80–100)
pO2,Arterial: 89 mm Hg (ref 80–100)

## 2017-04-23 LAB — PHOSPHORUS: Phosphorus: 3.6 mg/dL (ref 2.7–4.5)

## 2017-04-23 LAB — COMPREHENSIVE METABOLIC PANEL
ALT: 73 U/L — ABNORMAL HIGH (ref 0–35)
AST: 29 U/L (ref 0–35)
Albumin: 2.4 g/dL — ABNORMAL LOW (ref 3.5–5.2)
Alk Phos: 294 U/L — ABNORMAL HIGH (ref 35–105)
Anion Gap: 15 (ref 7–16)
Bilirubin,Total: 0.7 mg/dL (ref 0.0–1.2)
CO2: 19 mmol/L — ABNORMAL LOW (ref 20–28)
Calcium: 7.9 mg/dL — ABNORMAL LOW (ref 8.6–10.2)
Chloride: 98 mmol/L (ref 96–108)
Creatinine: 0.95 mg/dL (ref 0.51–0.95)
GFR,Black: 70 *
GFR,Caucasian: 61 *
Glucose: 134 mg/dL — ABNORMAL HIGH (ref 60–99)
Lab: 47 mg/dL — ABNORMAL HIGH (ref 6–20)
Potassium: 3.8 mmol/L (ref 3.3–5.1)
Sodium: 132 mmol/L — ABNORMAL LOW (ref 133–145)
Total Protein: 5.6 g/dL — ABNORMAL LOW (ref 6.3–7.7)

## 2017-04-23 LAB — CBC AND DIFFERENTIAL
Baso # K/uL: 0 10*3/uL (ref 0.0–0.1)
Basophil %: 0 %
Eos # K/uL: 0.7 10*3/uL — ABNORMAL HIGH (ref 0.0–0.4)
Eosinophil %: 3.4 %
Hematocrit: 28 % — ABNORMAL LOW (ref 34–45)
Hemoglobin: 9.3 g/dL — ABNORMAL LOW (ref 11.2–15.7)
Lymph # K/uL: 1 10*3/uL — ABNORMAL LOW (ref 1.2–3.7)
Lymphocyte %: 5.1 %
MCH: 30 pg/cell (ref 26–32)
MCHC: 34 g/dL (ref 32–36)
MCV: 88 fL (ref 79–95)
Mono # K/uL: 1 10*3/uL — ABNORMAL HIGH (ref 0.2–0.9)
Monocyte %: 5.1 %
Neut # K/uL: 14.7 10*3/uL — ABNORMAL HIGH (ref 1.6–6.1)
Nucl RBC # K/uL: 0 10*3/uL (ref 0.0–0.0)
Nucl RBC %: 0 /100 WBC (ref 0.0–0.2)
Platelets: 371 10*3/uL — ABNORMAL HIGH (ref 160–370)
RBC: 3.1 MIL/uL — ABNORMAL LOW (ref 3.9–5.2)
RDW: 18.2 % — ABNORMAL HIGH (ref 11.7–14.4)
Seg Neut %: 74.4 %
WBC: 19.1 10*3/uL — ABNORMAL HIGH (ref 4.0–10.0)

## 2017-04-23 LAB — DIFF MANUAL
Bands %: 3 % (ref 0–10)
Diff Based On: 117 CELLS
Metamyelocyte %: 3 % — ABNORMAL HIGH (ref 0–1)
Myelocyte %: 7 % — ABNORMAL HIGH (ref 0–0)

## 2017-04-23 LAB — APTT: aPTT: 27 s (ref 25.8–37.9)

## 2017-04-23 LAB — RBC MORPHOLOGY

## 2017-04-23 LAB — GRAM STAIN

## 2017-04-23 LAB — POCT GLUCOSE
Glucose POCT: 128 mg/dL — ABNORMAL HIGH (ref 60–99)
Glucose POCT: 141 mg/dL — ABNORMAL HIGH (ref 60–99)
Glucose POCT: 144 mg/dL — ABNORMAL HIGH (ref 60–99)
Glucose POCT: 168 mg/dL — ABNORMAL HIGH (ref 60–99)
Glucose POCT: 184 mg/dL — ABNORMAL HIGH (ref 60–99)

## 2017-04-23 LAB — MAGNESIUM: Magnesium: 2.1 mg/dL (ref 1.6–2.5)

## 2017-04-23 LAB — PROTIME-INR
INR: 1.1 (ref 0.9–1.1)
Protime: 12.7 s (ref 10.0–12.9)

## 2017-04-23 LAB — TRIGLYCERIDES: Triglycerides: 172 mg/dL — AB

## 2017-04-23 MED ORDER — ACETAMINOPHEN 500 MG PO TABS *I*
1000.0000 mg | ORAL_TABLET | Freq: Three times a day (TID) | ORAL | Status: DC
Start: 2017-04-23 — End: 2017-06-25
  Administered 2017-04-23 – 2017-06-17 (×142): 1000 mg via ORAL
  Filled 2017-04-23 (×152): qty 2

## 2017-04-23 MED ORDER — STERILE WATER FOR INJECTION (TPN USE ONLY) *I*
INTRAVENOUS | Status: DC
Start: 2017-04-23 — End: 2017-04-23

## 2017-04-23 MED ORDER — FUROSEMIDE 10 MG/ML IJ SOLN *I*
40.0000 mg | Freq: Once | INTRAMUSCULAR | Status: AC
Start: 2017-04-23 — End: 2017-04-23
  Administered 2017-04-23: 40 mg via INTRAVENOUS
  Filled 2017-04-23: qty 4

## 2017-04-23 MED ORDER — FAT EMULSION SOYBEAN OIL 20 % IV EMUL *WRAPPED*
60.0000 g | Freq: Every day | INTRAVENOUS | Status: AC
Start: 2017-04-23 — End: 2017-04-24
  Administered 2017-04-23 – 2017-04-24 (×13): 60 g via INTRAVENOUS
  Filled 2017-04-23: qty 300

## 2017-04-23 MED ORDER — METHADONE HCL 5 MG PO TABS *I*
5.0000 mg | ORAL_TABLET | Freq: Three times a day (TID) | ORAL | Status: DC
Start: 2017-04-23 — End: 2017-05-13
  Administered 2017-04-23 – 2017-05-13 (×61): 5 mg via ORAL
  Filled 2017-04-23 (×61): qty 1

## 2017-04-23 MED ORDER — STERILE WATER FOR INJECTION (TPN USE ONLY) *I*
INTRAVENOUS | Status: AC
Start: 2017-04-23 — End: 2017-04-24
  Filled 2017-04-23: qty 840

## 2017-04-23 MED ORDER — FENTANYL CITRATE 50 MCG/ML IJ SOLN *WRAPPED*
INTRAMUSCULAR | Status: AC
Start: 2017-04-23 — End: 2017-04-23
  Filled 2017-04-23: qty 2

## 2017-04-23 MED ORDER — LIDOCAINE HCL 1 % IJ SOLN *I*
INTRAMUSCULAR | Status: AC
Start: 2017-04-23 — End: 2017-04-23
  Filled 2017-04-23: qty 20

## 2017-04-23 MED ORDER — FAT EMULSION SOYBEAN OIL 20 % IV EMUL *WRAPPED*
60.0000 g | Freq: Every day | INTRAVENOUS | Status: DC
Start: 2017-04-23 — End: 2017-04-23

## 2017-04-23 MED ORDER — MIDAZOLAM HCL 1 MG/ML IJ SOLN *I* WRAPPED
INTRAMUSCULAR | Status: AC
Start: 2017-04-23 — End: 2017-04-23
  Filled 2017-04-23: qty 2

## 2017-04-23 MED ORDER — LIDOCAINE 5 % EX PTCH *I*
1.0000 | MEDICATED_PATCH | CUTANEOUS | Status: AC | PRN
Start: 2017-04-23 — End: 2017-04-30
  Administered 2017-04-23 – 2017-04-27 (×3): 1 via TRANSDERMAL
  Filled 2017-04-23 (×5): qty 1

## 2017-04-23 NOTE — Progress Notes (Signed)
Report Given           Procedure Summary  [employer] Nursing Procedure Note    Suri Tafolla  [id number]    Procedure: Perc Drain        Status: Completed    Patient tolerated procedure well     Specimen Collection: yes    Sponge count: N/A    Fluid Removed:Yes, color:yellow and purulent, amount: 60 ml  Procedure Dressing Site located:Abdomen  Dressing Type:sterile gauze/tegaderm  Biopatch:no  Dressing status:Clean, dry and intact   Hematoma:Not evident  Medication received:None  Cardiovascular:   Peripheral Pulses: N/A      Fistula: N/A    Neuro Assessment:N/A    Implant patient information given to patient or parent/guardian:N/A    Report given to:Unit Nurse. Unit 836[address] Name Mitzi Hansen, RN       Last Filed Vitals    04/23/17 1700   BP: 120/60   Pulse: 93   Resp: 24   Temp:    SpO2: 100%

## 2017-04-23 NOTE — Student Note (Signed)
SICU attending addendum:  Pt seen, examined, and evaluated on multidisciplinary rounds with SICU team.     Amanda Galloway is a 9 y F with [place] significant to fibromyalgia and pancreatic cancer on methadone for pain management who is now s/p whipple 03/29/17. Her intraoperative and immediate post-operative course were uncomplicated with a brief SICU admission postop for pain management, discharged to floor 1/28. 1/31 her NGT was replaced for nausea and vomiting with concern for gastric outlet obstruction. 2/12 pt developed mental status changes, tachypnea and worsening transaminitis. CT abdomen showed a hematoma compressing portal vein and CBD. She was taken to IR for percutaneous biliary drain. She required intubation for respiratory failure prior to the procedure and was readmitted to SICU postop for management. She developed septic shock requiring fluid resuscitation and pressors. Her sepsis improved and she was weaned from pressors and [facility].    Pt somnolent 2/17-18, methadone stopped 2/18 am with improvement in mental status over last 24 hr. Pt uncomfortable this morning, splinting with inspiration.    Physical Exam by Systems:  Gen: awake in bed, mild distress  HEENT: NcAT, PERRLA,neck supple, LIJ site CDI  Cardiac: RRR, no MRG  Pulm: coarse BS b/l, HFNC, minimal productive cough  Abdom: S, some tenderness to palpation but no guarding or rebound, +BS, incisions healing, no erythema or induration  Extr: WWP, + 1 edema  Neuro: A&Ox3, grossly intact    The Problem List was discussed and all plans reviewed by me. I agree with the findings and plan above.     Somolence resolved but increased pain overnight needing 4 doses of 0.90m dilaudid IV.   - restart methadone 559mTID  - start tylenol 1gr Q 8hr  - lidoderm patch for incisional abdominal pain  - IR for drainage and culture of anterior abdominal fluid collection  - 4040masix now, evaluate UOP and wean HFNC as tolerated.     Patient updated at  bedside, plan discussed with transplant surgery team.    This patient is critically ill with at least 1 organ system failure associated with a high probability of imminent or life threatening deterioration. the care I have delivered involved high complexity decision making to assess, manipulate, and support vital system function(s), to treat the vital organ system failure and/or prevent further life threatening deterioration of the patient's condition. All nursing documentation, laboratory data, test results, and radiographs were reviewed and interpreted by me. I have established the management plan for this patient's critical illness and have been immediately available to assist with patient care. my documented total critical care time reflects my own patient care time and does not include teaching or procedure time.   critical care time: I spent 30 minutes personally attending to this patient's critical care needs. This critical care time is exclusive of any time for separately billable procedures.      Signed: LauDarin Engels, 04/23/17 @ 1452      SICU Progress Note    Amanda Galloway a [age]F with pMHHickmangnificant to fibromyalgia and pancreatic cancer on methadone for pain management who is now s/p whipple 03/29/17. Her intraoperative and immediate post-operative course were uncomplicated with a brief SICU admission postop for pain management, discharged to floor 1/28. 1/31 her NGT was replaced for nausea and vomiting with concern for gastric outlet obstruction. 2/12 pt developed mental status changes, tachypnea and worsening transaminitis. CT abdomen showed a hematoma compressing portal vein and CBD. She was taken to IR  for percutaneous biliary drain. She required intubation for respiratory failure prior to the procedure and was readmitted to SICU postop for management. She developed septic shock requiring fluid resuscitation and pressors. Her sepsis improved and she was weaned from pressors and extubated to HFNC  2/17 am.     Interval History: 2/18 D: Methadone d/c'ed for lethargy. Pulm toilet with bed percussion. D/C'ed zosyn. CT for possible obstruction.   2/19N: Pain control issues; Dilaudid x4 given     Past Med/Sx History:   Past Medical History:   Diagnosis Date    Acute kidney failure 03/31/2017    Cancer     Depression     Fibromyalgia     Hypothyroidism      Physical Exam by Systems:  Gen: awake in bed, NAD  HEENT: NGT with light brown gastric fluid output  Cardiac: RRR  Pulm: coarse BS b/l, improves with cough  Abdom: S, minimal tenderness to palpation, incisions CDI, healing without induration or erythema  Extr: WWP, +1 edema to knees  Neuro: oriented x3, awake, follows commands all extremities  Labs:   WBC: 19.1 Lactate, arterial  1.1   Blood culture IVAD: Enterobacter cloacae complex  NA 132 downtrending  CO2 19 downtrending   UN 47   Ca 7.9         Assessment and Plan for Active/Followed Hospital Problems:   Active Hospital Problems    Diagnosis    *!*Pancreatic adenocarcinoma s/p Whipple 03/29/17     -Whipple on 1/25 with Dr. Ron Agee  [facility] course c/b gastric outlet obstruction d/t severe compression of the main portal vein and common bile duct by hematoma, PTC drain 2/12 in IR  - Zosyn 2/12 - 2/18  - stop decadron Decadron  -Continue TPN with SSI for glucose control  2/19- Zosyn course extended to 14 days, methadone d/c'd       Sepsis     - Occurring in setting of biliary obstruction s/p PTC external/internal placement for source control  - BCx 2/12 Enterobacter 72hrs to growth, Zosyn (2/12-2/18) Vanco (2/12-2/14)  - Levo off   - Goal SBP >90, MAP >60   - Enterobacter cloacae complex      Acute respiratory failure     - Intubated 2/12 prior to [facility] placement for increased WOB  - Extubated 2/17  - HFNC  - Lasix gtt started 2/15,  24 hours net - 5L, UOP 7.4L, Will hold lasix until tomorrow  - IS and Flutter valve   - Chest PT       hx of Pulmonary embolism     - PE 01/18/17 on CT chest   - This  hospitalization has been on therapeutic Lovenox, and Heparin gtt  - Currently holding therapeutic anticoagulation in setting of hematoma  - Heparin SQ TID      Acute blood loss anemia     - H/H 9.1/26, stable  - Last transfusion 2/14  - Will monitor and transfuse prn        Cancer associated pain     Methadone at home  Pain management guided by patient's primary pain physician Dr Irven [place]  - methadone at 44m IV BID (09:00 & 21:00) and 7.5 mg IV qDay @ 13:00  - 2/15 increase methadone to 7.565mand 1078m- QTc acceptable, monitor for prolongation  - consider PCA when appropriate    - Held methadone for sedation, will restart 5 mg IV this afternoon.   -methadone d/c'd 2/19  Depression     -Continue Elavil and Lexapro      Acute kidney injury     - Creatinine baseline ~0.8  - Cr 1.1 (1.1), BUN 52 (45), K 3.7  - Lasix stopped 2/17 evening for HoTN, will likely restart tomorrow.       Hypothyroidism     -Synthroid        Plan:   Goal: SBP > 90 or MAP > 60   Flush biliary drain BID w/ 5cc NS (2000 and 0800)  Zosyn course extended to 14 days,    methadone 55m TID po for pain control to improve pulmonary toilet    Lasix ordered    Contact IR     Author: MMargarito Courser as of: 04/23/2017  at: 8:07 AM

## 2017-04-23 NOTE — Preop H&P (Signed)
UPDATES TO PATIENT'S CONDITION on the DAY OF SURGERY/PROCEDURE    I. Updates to Patient's Condition (to be completed by a provider privileged to complete a H&P, following reassessment of the patient by the provider):    Day of Surgery/Procedure Update:  History  (Inpatients only): I confirm that progress notes within the past 24 hours document updates to the patient's condition.    Physical  (Inpatients only): I confirm that progress notes within the past 24 hours document updates to the patient's condition.            II. Procedure Readiness   I have reviewed the patient's H&P and updated condition. By completing and signing this form, I attest that this patient is ready for surgery/procedure.    III. Attestation   I have reviewed the updated information regarding the patient's condition and it is appropriate to proceed with the planned surgery/procedure.    Earmon Phoenix, PA as of 4:25 PM 04/23/2017

## 2017-04-23 NOTE — Progress Notes (Addendum)
Surgical Oncology/Hepatobiliary Surgery Progress Note     LOS: 25 days     Subjective:    More alert this morning; more readily answers questions.  Pain control issues overnight requiring dilaudid x4.  NGT output remains low.  CT abd/pelvis shows improvement of hematoma, and contrast moves through without signs of obstruction, but there is an anterior abdominal fluid collection and pleural effusions with left being larger.  WBC increased.    Objective:    Vitals Sign Ranges for Past 24 Hours:  BP: (124)/(60)   Temp:  [36 C (96.8 F)-37 C (98.6 F)]   Temp src: Temporal (02/19 0800)  Heart Rate:  [73-100]   Resp:  [12-29]   SpO2:  [95 %-99 %]       Physical Exam:    General Appearance: no distress, appears uncomfortable  HEENT: NGT with light clear pink output  Cardiac: regular rate  Respiratory: HFNC in place, non-labored breathing  Abdomen: soft, mildly tender, pre-existing ecchymosis, biliary tube with bilious output  Extremities: warm    Labs:    CBC:    Recent Labs  Lab 04/22/17  2354  04/21/17  2348  04/21/17  0005  04/19/17  2329  04/19/17  0017  04/17/17  2344   WBC 19.1*  --  15.5*  --  15.5*  --  22.1*  --  22.7*  --  21.2*   Hemoglobin 9.3*   9.8*  < > 8.7*  < > 8.1*   8.3*  < > 8.2*  < > 7.7*   7.9*  < > 6.9*   Hematocrit 28*  --  26*  --  24*  --  24*  --  22*  --  21*   Platelets 371*  --  353  --  309  --  335  --  298  --  384*   < > = values in this interval not displayed.    Metabolic Panel:    Recent Labs  Lab 04/22/17  2354 04/21/17  2348 04/21/17  1201 04/21/17  0005 04/20/17  1210 04/19/17  2329 04/19/17  0017 04/17/17  2344   Sodium 132* 137 137 137 137 138 135 132*   Potassium 3.8 3.7 3.7 4.0 3.7 3.4 3.9 4.0   Chloride 98 97 96 97 96 97 99 97   CO2 19* '24 25 26 ' 30* '27 23 22   ' UN 47* 52* 45* 39* 28* 24* 22* 22*   Creatinine 0.95 1.11* 1.10* 1.00* 1.00* 0.85 0.80 0.83   Glucose 134* 155* 143* 165* 111* 135* 187* 224*   Calcium 7.9* 8.4* 8.8 8.6 9.1 8.8 8.9 7.8*   Magnesium 2.1 2.0  --  1.8  --   1.6 2.2 1.9   Phosphorus 3.6 5.5*  --  4.5  --  3.0 3.1 3.1          Assessment:    72 y.o. female with h/o recent PE and pancreatic cancer POD # 25  status post whipple procedure complicated by delayed gastric emptying.  NGT replaced on 04/04/17. UGI on 04/12/17 concerning for obstruction just beyond the [place] in the efferent limb.  Course complicated on 7/42/59 by rising LFTs and sepsis with CT concerning for cholangitis given biliary tree obstruction and PV compression due to hematoma and afferent limb distention in setting of PE treatment. Is s/p IR PTC 04/16/17.    More awake today. CT abd/pelvis shows an anterior abdominal fluid collection and pleural effusions with left being  larger; no obstruction observed. Ongoing high oxygen requirement. WBC increased to 19.1 (15.5).    Plan:    - Consult IR for aspiration of abdominal fluid collection, and pigtail to left pleural effusion. Culture both.  - NPO, TPN  - NGT to LIWS  - PTC/biliary drain to gravity  - IV Zosyn x 14 days given pt had bacteremia; consider re-culturing port and peripherally given rising WBC.  - SCDs, heparin SC (hold therapeutic anticoagulation) for VTE ppx  - Appreciate SICU care      Donata Duff, MD  04/23/2017     9:58 AM  General Surgery Resident      HPB-GI Attending Addendum:   I personally examined the patient, reviewed the notes, and discussed the plan of care with the residents and patient. Agree with detailed resident's note. Please see the note above for details of history, exam, labs, assessment/plan which reflect my input.     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      Gastrojejunostomy obstruction improving. Continue nasogastric tube. Monitor output. Replete as necessary. Metoclopramide.   afferent limb bowel obstruction due to hematoma. CT indicates improvement.    Hyperglycemia without  need for insulin. Monitor.   Post operative anemia. No evidence of bleeding. Dilutional.   Volume overload  Pulmonary insufficiency with high flow nasal cannula.   Bilateral pleural effusions with lung compression.   Hypoalbuminemia. Malnutrition.   Somnolence. Improving.   Post operative anemia. Stable. Monitor.   Leukocytosis with resolving bandemia from cholangitis with bacteremia. Continue antibiotics for 14 days.   CT scan with passage of contrast. Small fluid collection in superior abdomen, will ask for IR aspiration.      Delano Metz, MD, FACS  Hepatobiliary, Pancreas & GI Surgery

## 2017-04-23 NOTE — Procedures (Signed)
Procedure Report      PROCEDURE NOTE    Time out documentation completed in Procedure navigator prior to procedure:  Yes    Indications  8 female with complicated hospital course, pancreatic cancer s/p Whipple procedure; now with small fluid collection along incision site.     Procedure Details  Successful CT guided aspiration of midline abdominal fluid collection.  For full details please see dictated report.     Findings  60 cc purulent fluid removed. Pocket completely evacuated.     Complications  At the very end of the procedure the patient broke the sterile field and put her hand on the needle, advancing it about 1 cm as we were about to take the final image. Final image shows some gas in the cavity and the needle thus advanced.     Condition  No change from prior    Plan/Orders  Follow up specimens    EBL: trace    Specimens  yes    Disposition  Return to unit    Earmon Phoenix, [place]  04/23/2017  5:19 PM

## 2017-04-23 NOTE — Invasive Procedure Plan of Care (Signed)
Invasive Procedure Plan of Care (Consent Form 419):   Condition(s) Addressed: Abnormal fluid collection which may be infected   Performing Provider: Earmon Phoenix, and Vascular and Interventional Radiology Attendings, Fellows, Residents and Advanced Practice Providers      Side: Not applicable    Procedure: Fluid aspiration or drainage   Special Equipment:    Planned Anesthesia: Local   Benefits: Symptomatic relief by aspirating or draining fluid. Obtain samples to guide further management   Risks: Bleeding, infection, peritonitis, fistula, injury to surrounding organs and structures, and in very rare circumstances death.   Alternatives: Not to perform the procedure   Expected Length of Stay: 0 day(s)     I, or a designated member of my surgical team, have discussed the planned procedure, including the potential for any transfusion of blood products or receipt of tissue as necessary, expected benefits, the potential complications and risks and possible alternatives and their benefits and risks with the patient or the patient's surrogate. In my opinion, the patent or the patient's surrogate understands the proposed procedure, its risks, benefits, and alternatives.    Electronically signed by Earmon Phoenix, PA at 4:07 PM     Patient Consent:  I hereby give my consent and authorize Keely Drennan, and Vascular and Interventional Radiology Attendings, Fellows, Residents and Advanced Practice Providers     (The list of possible assistants, all of whom are privileged to provide surgical services at the hospital, is available)  To treat the following: Abnormal fluid collection which may be infected  Procedure includes: Fluid aspiration or drainage  Laterality: Not applicable  1 The care provider has explained my condition to me, the benefits of having the above treatment procedure, and alternate ways of treating my condition. I understand that no guarantees have been made to me about the result of the treatment. The  alternatives to this procedure include: Not to perform the procedure   2 The care provider has discussed with me the reasonably foreseeable risks of the treatment and that there may be undesirable results. The risks that are specifically related to this procedure include: Bleeding, infection, peritonitis, fistula, injury to surrounding organs and structures, and in very rare circumstances death.   3 I understand that during the treatment a condition may be discovered which was not known before the treatment started. Therefore, I authorize the care provider to perform any additional or different treatment which is thought necessary and available.   4 Any tissue, parts, or substances removed during the procedure may be retained or disposed of in accordance with customary scientific, educational and clinical practice.   5 Vendor information if appropriate:    6 If blood products are needed, I would agree:    7 If tissue products are needed, I would agree:    8 Blood/Tissue use limitations and/or exclusions:       I have carefully read and fully understand this informed consent form, and have had sufficient opportunity to discuss my condition and the above procedure(s) with the care provider and his/her associates, and all of my questions have been answered to my satisfaction. I understand that my surgeon/provider performing the procedure may not be physically present in the operating/procedure room the entire time that I am there. My surgeon/provider has answered my questions regarding this and how it may relate to my surgery/procedure. I agree to the Plan of Care as outlined above.  Patient Signature   (or Parent/Legal Guardian if pt is unable to sign or is a minor)  Date/Time     Electronic Signatures will display at the bottom of the consent form.

## 2017-04-23 NOTE — Progress Notes (Signed)
Interventional Radiology Pre-Procedure Handoff/Checklist    NPO: [x] YES [] NO [] N/A     Anticoagulants:LMW heparin    Telemetry: [x] YES [] NO [] N/A     Transport mode: Bed  Number of Transporters needed: 2      IV access:   PCVC Triple Lumen (percutaneous) 04/17/17 Left Internal jugular (Active)   Phlebitis Scale Grade 0 04/23/2017  8:00 AM   Infiltration Scale Grade 0 04/23/2017  8:00 AM   Proximal Lumen Status Infusing;Flushed;Blood return noted 04/23/2017  8:00 AM   Medial Lumen Status Flushed;Blood return noted;Normal saline locked 04/23/2017  8:00 AM   Distal Lumen Status Infusing;Flushed 04/23/2017  8:00 AM   Dressing Type Transparent 04/23/2017  8:00 AM   Dressing Status Clean, dry and intact 04/23/2017  8:00 AM   (T)Transparent Drsg Change-Q7D Dressing changed 04/22/2017  9:00 AM   Dressing Change Due 04/29/17 04/22/2017  8:00 PM   Bag to hub changed? No 04/22/2017  8:00 PM   Next bag to hub change due 04/25/17 04/22/2017  8:00 PM   Cap(s) Changed? No 04/22/2017  8:00 PM   Next cap change due 04/25/17 04/22/2017  8:00 PM   **Need for continuing central line addressed? Yes - central line still necessary 04/23/2017  8:00 AM   Freq of line accesses and lab draws addressed? Yes 04/23/2017  8:00 AM   CVP Transduced No 04/23/2017  8:00 AM       Peripheral IV 04/17/17 0001 Right Forearm (distal third) (Active)   Phlebitis Scale Grade 0 04/23/2017  8:00 AM   Infiltration Scale Grade 0 04/23/2017  8:00 AM   Line Status Saline locked;No blood return;Flushed 04/23/2017  8:00 AM   Dressing Type Transparent 04/23/2017  8:00 AM   Dressing Status Clean, dry and intact 04/23/2017  8:00 AM       Respiratory: O2 Nasal Cannula high flow liters and BIPAP    Consentable:yes    Alert and Oriented to person, place and time?: yes    Code Status:full     Does patient wear an insulin pump? [] YES [x] NO [] N/A     Precautions:none    Allergies: No Known Allergies (drug, envir, food or latex)    Rubin Payor, RN Received Handoff Report from [facility]  for IR procedure 2:02 PM

## 2017-04-23 NOTE — Progress Notes (Signed)
Report Given To  Levada Dy, RN      Descriptive Sentence / Reason for Admission   PMHx: pancreatic adenocarcinoma    1/25 s/p open Whipple. She is being admitted to SICU for post-operative monitoring, notably hemodynamic status and post-op pain control.      Active Issues / Relevant Events   2/8: upper GI series done  2/12: Continued confusion/pain on CC5, blood/urine culture sent. CT abd done; hematoma near common bile duct found; IR for biliary drain. Intubated for inc RR. Transferred to [address]3600. 2L IVF for HoTn.   2/13N: 5.5 L for HoTN and Levo; A-line and CVC placed. DAYS: ECHO (bilateral pleural effusions), IV methadone restarted  2/14N: 2.5L p-lyte, levo off DAYS: PEEP weaned throughout the day, 1 uPRBC for H/H 6.9/21; MIVF d/c'd; Fi02 40% --> 100% d/t acute episode of hypoxia during repositioning in the evening.  2/15N: CXR done. Vent weaned from 100% at start of shift. Mediport de-accessed.  2/15D Methadone dose increased (plan to wean fentanyl). CXR showing worsening L pleural effusion. Lasix gtt initiated. VSS. Blood cultures positive for Klebsiella enterobacter.   2/16N: Fentanyl weaned. 41mEq K x3.   2/16D Tolerated PS for 3 hours on 50% 8/8. Lasix gtt off, diamox started  2/17: Lasix restarted  2/17 D: extubated to HFNC, OOB to chair, increased FiO2 for desat into high 80s, worked with PT, back to bed with Clarise Cruz lift, lethargic this afternoon (very delayed responses, arousable but falls back asleep quickly, needs lots of prompting/multiple requests to follow commands), ABG - pCO2 36, pO2 66. Hold evening dose of methadone.   2/18N: Lethargic; held nightly dose of Methadone  2/18D: Methadone d/c'ed for lethargy. Pulm toilet with bed percussion. D/C'ed zosyn. CT for possible obstruction.   2/19N: Pain control issues; Dilaudid x4 given. Added tylenol, lidocaine patch and methadone for pain control. Went to IR for aspiration of 60cc of fluid collection, sent for cx.      To Do List   Goal: SBP > 90 or MAP >  60   Flush biliary drain BID w/ 5cc NS (2000 and 0800)    Please remember to complete care plan and education record at 0400 and 1600 hours      Anticipatory Guidance / Discharge Planning  [facility] Prevention: NGT, LIJ CVC, L brachial a-line, Mediport (de-accessed) , PIV x1, biliary drain, Foley     Daughter updated at bedside 2/19

## 2017-04-23 NOTE — Progress Notes (Signed)
Physical Therapy Treatment Note      Mobility Recommendation Mobility Recommendations: (P) Saralift vs hoyer OOB        04/23/17 1055   PT Tracking   PT TRACKING PT Assigned   Visit Number   Visit Number [facility]) / Treatment Day (HH) 2   Precautions/Observations   Precautions used Yes   LDA Observation Monitors;Central Line;A-line;O2 (comment);Foley cath;Drain  (NG tube, HFNC)   Fall Precautions General falls precautions  (torso support in chair)   Pain Assessment   *Is the patient currently in pain? Yes   Revised FLACC - Face 1   Revised FLACC - Legs 1   Revised FLACC - Activity 1   Revised FLACC - Cry 1   Revised FLACC - Consolability 1   Revised FLACC Score 5   Additional comments Pt yelled out in pain when Saralift initiated but otherwise did not complain of pain although she was tense/rigid throughout.   Cognition   Arousal/Alertness Delayed responses to stimuli   Orientation Oriented to person;Disoriented to place;Disoriented to time;Disoriented to situation   Following Commands Intermittently follows commands   Intermittently Follows Commands (less than 25% of the time, assist to initiate all tasks)   Additional Comments Pt more awake with eyes open today compared to previous PT session two days ago, however she again did not readily participate, respond, or follow directions.   Bed Mobility   Bed mobility Tested   Supine to Sit 2 person assist;Maximum assist ;Side rails up (#);Head of bed elevated  (use of sheet to assist at shoulders)   Sit to Supine Not tested  (Pt in recliner chair at end of session.)   Additional comments Once positioned with hips stable at EOB she was able to maintain static upright sitting balance. She did attempt to adjust her balance and scoot forward once each after multiple cues to do so, although was unable to complete task without assistance. Pt sat EOB ~15 minutes.   Transfers   Transfers Tested   Sit to Stand Mechanical lift   Additional comments Utilized Saralift for sit to stand  transfer x1 rep today. Pt required total assist for lift set up. Once lift activated she assisted (although less than previous session) and achieved ~3/4 stand. She tolerated standing ~20 seconds each time with ability to transfer her from bed to chair while standing.   Mobility   Mobility Not tested (comment)   Additional comments Pt unable to at this time.   Therapeutic Exercises   Additional comments Worked on hand placement on lap and bed at EOB as well as anterior weight shifts as pre-transfer activities. Attempted UE reaching as well as LE exercises (LAQs and hip flexion) in sitting prior to using the [place]. Pt did not participate but was able to maintain static sitting balance once placed into the position.   Balance   Balance Tested   Sitting - Static Standby assist   Sitting - Dynamic Min assist;Mod assist   PT AM-PAC Mobility   Turning over in bed? 1   Sitting down on and standing up from a chair with arms? 1   Moving from lying on back to sitting on the side of the bed? 1   Moving to and from a bed to a chair? 1   Need to walk in hospital room? 1   Climbing 3 - 5 steps with a railing? 1   Total Raw Score 6   Standardized Score 23.55   CMS 1-100% Score 100  Medicare Functional Limit Modifiers CN 100% impaired, limited, restricted   Assessment   Brief Assessment Remains appropriate for skilled therapy   Problem List Impaired functional mobility   Plan/Recommendation   Treatment Interventions Restorative PT   PT Frequency 2-4x/wk   Mobility Recommendations Saralift vs hoyer OOB   Discharge Recommendations (rehab)   Assessment/Recommendations Reviewed With: Patient;Nursing   Next PT Visit Progressive mobility - continue Saralift but trial standing with RW as participation allows.   Time Calculation   PT Timed Codes 26   PT Untimed Codes 0   PT Unbilled Time 0   PT Total Treatment 26   Plan and Onset date   Plan of Care Date 04/21/17   Treatment Start Date 04/21/17   PT Charges   $PT [facility] Charges Functional  Training - code 0239 (88minx2)           Please page with any questions or concerns.    Robby Sermon, PT, DPT  Pager [phone]

## 2017-04-23 NOTE — Progress Notes (Signed)
---  Critical Care Bundle--    Fluids & Electrolytes: No MIVF, replete electrolytes as needed  Nutrition / last BM: TPN/Lipids, last BM unknown  Mobility plan: Activity as tolerated, will get OOBTC today, PT following  Neuro / sedation / pain / spontaneous awakening: Lexapro,   Dilaudid 0.5mg  PRN  Respiratory weaning (and PST, TC) / pulmonary toilet: Bed percussion q4, I/S, C&DB  Delirium / sleep / prevention / intervention: CAM-ICU: positive  List of antimicrobials: Zosyn  Drains / Lines / Tubes: TLC LIJ, Art Line, Foley, Biliary drain, PIV, NG, IVAD (deaccessed)  Indication for foley: Strict I/O  DVT prophylaxis / a PTT: Heparin and SCD / aPTT = 27  Indications for gastric acid suppression: Protonix daily  Labs ordered:    Daily BMP, ABG w/ panel, CBC w/ diff, CMP, Mg, APTT, Pt-INR, Mg, Phos  Family concern / updates: None updated today  Patient likes to be called: Amanda Galloway  Code status: Full  Disposition / Level: ICU Level 2  Nursing reiterate plan for day:    Added PO methadone, tylenol and lidocaine patches for pain control  Discussing with IR re: aspiration of anterior fluid collection  Give x 1 40mg   Lasix       Author: Malen Gauze, RN  as of: 04/23/2017 At: 9:38 AM

## 2017-04-23 NOTE — Consults (Signed)
Medical Nutrition Therapy - Follow Up, TPN check    Admit Date: 03/29/2017    Patient Summary: [name] with PMH pulmonary embolism and as noted below, dx pancreatic adenocarcinoma s/p FOLFIRINOX chemo with neoadjuvant SBRT (see Oncology notes for full hx), admitted for Whipple completed 03/29/17; post-op course c/b concern for GOO distal to the gastro-jejunostomy site. CT showing hematoma with main portal vein and common bile duct compression. TPN started 04/09/17. Pt transferred to SICU 04/16/17 for severe biliary sepsis, septic shock, shock liver, acute respiratory failure, acute blood loss anemia, pain r/t cancer, and hypotension. S/p IR percutaneous bilary drain placed 04/16/17.  Pt extubated 2/17, on HFNC.     Past Medical History:   Diagnosis Date    Acute kidney failure 03/31/2017    Cancer     Depression     Fibromyalgia     Hypothyroidism      Past Surgical History:   Procedure Laterality Date    CHOLECYSTECTOMY      CHOLECYSTECTOMY, LAPAROSCOPIC  09/06/2016    HYSTERECTOMY  08/1986    Fibroids    KNEE SURGERY Right     PR INSERT TUNNELED CV CATH WITH PORT Right 10/04/2016    Procedure: Right IJ MEDIPORT Insertion;  Surgeon: Delano Metz, MD;  Location: [facility] MAIN OR;  Service: Oncology General    PR LAP,DIAGNOSTIC ABDOMEN N/A 10/04/2016    Procedure: LAPAROSCOPY DIAGNOSTIC;  Surgeon: Delano Metz, MD;  Location: [facility] MAIN OR;  Service: Oncology General    PR PART [place] PANC,PROX+REMV DUOD+ANAST N/A 03/29/2017    Procedure: WHIPPLE PROCEDURE;  Surgeon: Delano Metz, MD;  Location: [facility] MAIN OR;  Service: Oncology General    ROTATOR CUFF REPAIR Right     TONSILLECTOMY AND ADENOIDECTOMY       Pertinent Social Hx:  Divorced,resides in [place], [place].  Supportive son and daughter.     Pertinent Meds: reviewed, of note:    Scheduled Meds:   heparin (porcine)  5,000 Units Subcutaneous Q8H    insulin regular  0-20 Units Subcutaneous Q6H    pantoprazole  40 mg Intravenous Q24H    piperacillin-tazobactam  3.375 g  Intravenous Q6H    levothyroxine  137 mcg Oral Daily    amitriptyline  25 mg Oral Nightly    escitalopram  20 mg Oral Nightly   Continuous Infusions:   TPN (2 in 1) Adult Continuous 75 mL/hr at 04/23/17 0859   PRN Meds:.HYDROmorphone PF, heparin lock flush, ondansetron, naloxone    Pertinent Labs: reviewed    Na+ low 132, whole blood Na+ low 128   UN high 47, creatinine now WNL, GFR WNL; improved renal fx labs with d/c of lasix drip   BGs high 103-141, insulin coverage and protocol in place   Corrected ICA low 4.5; last repletion outside of TPN was 2/14   Phos high 5.5 (new onset): K+ stable, WNL, last outside repletion was on 2/16   ALT high 73, alk phos high 94; t bili WNL; continuing improvement in LFTs   Anemic, MCV WNL; anemia panel not checked.  TPN provides U27 and folic acid but not iron.  Pt receiving intermittent transfusion support.  RBCs last given on 2/14 per I&Os.    Vit D level not checked.  TPN does not provide full DRI of vit D.     Vitals: Blood pressure 124/60, pulse 92, temperature 36.2 C (97.2 F), temperature source Temporal, resp. rate 22, height 1.702 m (_0 ), weight 73.5 kg (162 lb), SpO2  97 %.    Reviewed I/O's 2/18   2.6 UOP    470 ml biliary drain output   200 ml NG output plus unmeasured emesis x 1    No BMs    I/O last 3 days:  02/16 1500 - 02/19 1459  In: 7137.2 (97.1 mL/kg) [I.V.:85.1; Other:25; NG/GT:590; IV Piggyback:575]  Out: 14305 (194.7 mL/kg) [GEXBM:84132; Emesis/NG output:1620]  Net: -7167.8  Weight: 73.5 kg     Access:  PIV, triple lumen PCVC, single port IVAD, NG tube for gastric decompression    Nutrition Hx:    Pt reported she was primarily only eating cottage cheese with mandarin oranges prior to admission. She would have have this 4x daily and drink Ensure (believes it was Ensure plus) ~TID. She stated she didn't eat much else for weeks. Reports that eating was "awful" after the surgery and had only been able to eat very small amounts.   NPO since  1/31; TPN started 04/09/17 (10 days after admission)     Food allergies: NKFA    Current diet: NPO   Supplements: none    Nutrition Focused Physical Exam:  Edema: 2+ general, BUE, BLE per shift assessment  Abdomen: distended, hypoactive BS per shift assessment  Skin: bruising, abdominal surgical wound per shift assessment    Anthropometrics:  Height: 170.2 cm (_0 )    Weight: 73.5 kg (162 lb)  Ideal Body Weight: 72.4 kg + 10%  Weight is 101.5 % of IBW  BMI (Calculated): 25.4 kg/(m^2) healthy range for age  Weight Hx: stable x 5 months PTA; edema may be masking acute weight loss   04/17/2017 73.483 kg --with 1+ general, BUE, BLE edema    04/09/2017 73.483 kg Actual   03/29/2017 72 kg Actual admit weight with 1+ general, BLE edema    03/22/2017 71.215 kg --   03/11/2017 73 kg  --No edema per Surgical Oncology clinic note    03/06/2017 72.349 kg --   03/01/2017 73.755 kg --   02/19/2017 72.576 kg Actual   01/29/2017 73.573 kg Actual   01/28/2017 73 kg Actual   12/14/2016 73.256 kg --   11/16/2016 69.899 kg --   10/30/2016 67.586 kg Actual   10/05/2016 71.3 kg Actual     Estimated Nutrient Needs: (Based on admit weight 72 kg)    1800-2160 kcal/day (25-30 kcal/kg)   110-145 g protein/day (1.5-2.0 g/kg)    1800-2160 mL fluid/day (25-30 mL/kg) or per team recs     Nutrition Assessment and Diagnosis:   TPN is ordered for last night as follows:  2-in-1 TPN with lipids via adult central line; amino acid-dextrose solution @ 75 ml/hr x 24 hrs (1800 ml fluid per day), with:   Amino acids 70 g/L  Dextrose 150 g/L   Na+ 100 meq/L  K+ 30 meq/L  Ca++ 9 meq/L  Mg 5 meq/L  P 12 mmole/L  Standard MVI  Standard trace elements  1 mg folic acid  440 mg thiamine  Chloride: Acetate ratio- max chloride  AND: 60 g/day lipids (300 ml/day 20% soy lipid emulsion, given over 12 hours)  TPN with lipids provides a total of 2100 ml fluid, 2022 kcal, 126 g protein and 270 g dextrose per day.     BGs improved.  Phos level and renal fx labs improved with d/c of  lasix drip.  LFTs continue to improve.  No need to change to SMOF lipids at this time.  Outputs have remained fairly low; but serum sodium  and whole blood sodium are low again this morning.     Malnutrition Status: Pt with evidence of moderate malnutrition per nutrition note of 04/09/17; pt has received PN support since 04/09/17.      Nutrition Intervention:   1. TPN Initiation  The Clinical Nutrition Specialist agrees TPN is appropriate for this patient. Most recently reviewed at [place] on 04/17/17.  Original Start Date: 2/5  D/C Plan:  TPN will be discontinued once GOO resolves     2.  For Tuesday 04/23/17: recommend 2-in-1 TPN with lipids, via adult central line; amino acid dextrose solution @ 75 ml/hr x 24 hrs (1800 ml fluid per day), with: (changes in bold)  Amino acids 70 g/L  Dextrose 150 g/L   Na+ 105 meq/L  K+ 30 meq/L  Ca++ 9 meq/L  Mg 5 meq/L  Phos 12 mmol/L   Standard MVI  Standard trace elements  Chloride: Acetate ratio- max chloride.  AND: 60 g/day lipids (300 ml/day 20% soy lipid emulsion, given over 12 hours)  TPN with lipids will provide a total of 2100 ml fluid, 2022 kcal, 126 g protein and 270 g dextrose/day.     3.  Please continue parenteral monitoring panel. Recommended Monitoring Parameters:               PO4, Mg, and basic metabolic panel daily for 3 days or until stable, then 3 times per week. Replete lytes via IV as needed.                Comprehensive metabolic panel and triglycerides weekly. Hold lipids if TG>400.                Check BGs q 6 hours until stable on goal dextrose.                Monitor weight daily until fluid balance is stable, then weekly.               Accurate I&Os.      4.  Consider checking vit D3 level.  TPN does not provide full DRI of vit D.  Renal/liver injuries increase risk of deficiency.      Nutrition Monitoring/Evaluation:   1. Will monitor TPN tolerance, nutrition-related labs, weight trend, BM pattern.  2. Nutrition to follow up per high nutrition risk  protocol.    Serita Butcher, RD, [place] pager# [phone]

## 2017-04-23 NOTE — Progress Notes (Signed)
Report Given To  Amanda Hansen, RN      Descriptive Sentence / Reason for Admission   PMHx: pancreatic adenocarcinoma    1/25 s/p open Whipple. She is being admitted to SICU for post-operative monitoring, notably hemodynamic status and post-op pain control.      Active Issues / Relevant Events   2/8: upper GI series done  2/12: Continued confusion/pain on CC5, blood/urine culture sent. CT abd done; hematoma near common bile duct found; IR for biliary drain. Intubated for inc RR. Transferred to [address]3600. 2L IVF for HoTn.   2/13N: 5.5 L for HoTN and Levo; A-line and CVC placed. DAYS: ECHO (bilateral pleural effusions), IV methadone restarted  2/14N: 2.5L p-lyte, levo off DAYS: PEEP weaned throughout the day, 1 uPRBC for H/H 6.9/21; MIVF d/c'd; Fi02 40% --> 100% d/t acute episode of hypoxia during repositioning in the evening.  2/15N: CXR done. Vent weaned from 100% at start of shift. Mediport de-accessed.  2/15D Methadone dose increased (plan to wean fentanyl). CXR showing worsening L pleural effusion. Lasix gtt initiated. VSS. Blood cultures positive for Klebsiella enterobacter.   2/16N: Fentanyl weaned. 64mEq K x3.   2/16D Tolerated PS for 3 hours on 50% 8/8. Lasix gtt off, diamox started  2/17: Lasix restarted  2/17 D: extubated to HFNC, OOB to chair, increased FiO2 for desat into high 80s, worked with PT, back to bed with Clarise Cruz lift, lethargic this afternoon (very delayed responses, arousable but falls back asleep quickly, needs lots of prompting/multiple requests to follow commands), ABG - pCO2 36, pO2 66. Hold evening dose of methadone.   2/18N: Lethargic; held nightly dose of Methadone  2/18D: Methadone d/c'ed for lethargy. Pulm toilet with bed percussion. D/C'ed zosyn. CT for possible obstruction.   2/19N: Pain control issues; Dilaudid x4 given       To Do List   Goal: SBP > 90 or MAP > 60   Flush biliary drain BID w/ 5cc NS (2000 and 0800)    Please remember to complete care plan and education record at 0400 and 1600  hours      Anticipatory Guidance / Discharge Planning  [facility] Prevention: NGT, LIJ CVC, L brachial a-line, Mediport (de-accessed) , PIV x1, biliary drain, Foley     Daughter updated at bedside 2/18

## 2017-04-24 LAB — COMPREHENSIVE METABOLIC PANEL
ALT: 55 U/L — ABNORMAL HIGH (ref 0–35)
AST: 25 U/L (ref 0–35)
Albumin: 2.5 g/dL — ABNORMAL LOW (ref 3.5–5.2)
Alk Phos: 251 U/L — ABNORMAL HIGH (ref 35–105)
Anion Gap: 15 (ref 7–16)
Bilirubin,Total: 0.6 mg/dL (ref 0.0–1.2)
CO2: 19 mmol/L — ABNORMAL LOW (ref 20–28)
Calcium: 8.1 mg/dL — ABNORMAL LOW (ref 8.6–10.2)
Chloride: 101 mmol/L (ref 96–108)
Creatinine: 0.92 mg/dL (ref 0.51–0.95)
GFR,Black: 73 *
GFR,Caucasian: 63 *
Glucose: 125 mg/dL — ABNORMAL HIGH (ref 60–99)
Lab: 41 mg/dL — ABNORMAL HIGH (ref 6–20)
Potassium: 3.7 mmol/L (ref 3.3–5.1)
Sodium: 135 mmol/L (ref 133–145)
Total Protein: 5.9 g/dL — ABNORMAL LOW (ref 6.3–7.7)

## 2017-04-24 LAB — CBC AND DIFFERENTIAL
Baso # K/uL: 0 10*3/uL (ref 0.0–0.1)
Basophil %: 0 %
Eos # K/uL: 0.2 10*3/uL (ref 0.0–0.4)
Eosinophil %: 0.9 %
Hematocrit: 27 % — ABNORMAL LOW (ref 34–45)
Hemoglobin: 8.9 g/dL — ABNORMAL LOW (ref 11.2–15.7)
Lymph # K/uL: 1.5 10*3/uL (ref 1.2–3.7)
Lymphocyte %: 7.8 %
MCH: 30 pg/cell (ref 26–32)
MCHC: 33 g/dL (ref 32–36)
MCV: 89 fL (ref 79–95)
Mono # K/uL: 0.3 10*3/uL (ref 0.2–0.9)
Monocyte %: 1.7 %
Neut # K/uL: 16 10*3/uL — ABNORMAL HIGH (ref 1.6–6.1)
Nucl RBC # K/uL: 0 10*3/uL (ref 0.0–0.0)
Nucl RBC %: 0.1 /100 WBC (ref 0.0–0.2)
Platelets: 340 10*3/uL (ref 160–370)
RBC: 3 MIL/uL — ABNORMAL LOW (ref 3.9–5.2)
RDW: 18.3 % — ABNORMAL HIGH (ref 11.7–14.4)
Seg Neut %: 84.4 %
WBC: 18.8 10*3/uL — ABNORMAL HIGH (ref 4.0–10.0)

## 2017-04-24 LAB — POCT GLUCOSE
Glucose POCT: 136 mg/dL — ABNORMAL HIGH (ref 60–99)
Glucose POCT: 148 mg/dL — ABNORMAL HIGH (ref 60–99)
Glucose POCT: 119 mg/dL — ABNORMAL HIGH (ref 60–99)

## 2017-04-24 LAB — DIFF MANUAL
Bands %: 1 % (ref 0–10)
Diff Based On: 115 CELLS
Metamyelocyte %: 2 % — ABNORMAL HIGH (ref 0–1)
Myelocyte %: 1 % — ABNORMAL HIGH (ref 0–0)
Promyelocyte %: 2 % — ABNORMAL HIGH (ref 0–0)

## 2017-04-24 LAB — APTT: aPTT: 33.6 s (ref 25.8–37.9)

## 2017-04-24 LAB — PROTIME-INR
INR: 1.2 — ABNORMAL HIGH (ref 0.9–1.1)
Protime: 13.4 s — ABNORMAL HIGH (ref 10.0–12.9)

## 2017-04-24 LAB — CARNITINE FREE AND TOTAL
Carn,Ester/Free: 0.2 (ref 0.1–1.0)
Carnitine Esterified (ACYL): 4 umol/L — ABNORMAL LOW (ref 5–29)
Carnitine, Free: 20 umol/L — ABNORMAL LOW (ref 25–60)
Carnitine, Total: 24 umol/L — ABNORMAL LOW (ref 34–86)

## 2017-04-24 LAB — MAGNESIUM: Magnesium: 2.2 mg/dL (ref 1.6–2.5)

## 2017-04-24 LAB — PHOSPHORUS: Phosphorus: 3.7 mg/dL (ref 2.7–4.5)

## 2017-04-24 LAB — RBC MORPHOLOGY

## 2017-04-24 MED ORDER — FLEET ENEMA 7-19 GM/118ML RE ENEM *I*
1.0000 | ENEMA | Freq: Every day | RECTAL | Status: DC
Start: 2017-04-24 — End: 2017-04-26
  Administered 2017-04-24 – 2017-04-25 (×2): 1 via RECTAL

## 2017-04-24 MED ORDER — STERILE WATER FOR INJECTION (TPN USE ONLY) *I*
INTRAVENOUS | Status: AC
Start: 2017-04-24 — End: 2017-04-25
  Filled 2017-04-24: qty 840

## 2017-04-24 MED ORDER — FAT EMULSION SOYBEAN OIL 20 % IV EMUL *WRAPPED*
60.0000 g | Freq: Every day | INTRAVENOUS | Status: AC
Start: 2017-04-24 — End: 2017-04-25
  Administered 2017-04-24 – 2017-04-25 (×13): 60 g via INTRAVENOUS
  Filled 2017-04-24: qty 300

## 2017-04-24 MED ORDER — DOCUSATE SODIUM 10 MG/ML PO LIQD *I*
100.0000 mg | Freq: Two times a day (BID) | ORAL | Status: DC
Start: 2017-04-24 — End: 2017-04-27
  Administered 2017-04-24 – 2017-04-26 (×5): 100 mg via ORAL
  Filled 2017-04-24 (×5): qty 10

## 2017-04-24 NOTE — Plan of Care (Signed)
Cognitive function     Cognitive function will be maintained Maintaining        Impaired Gas Exchange     Patient will maintain adequate oxygenation Maintaining        Post-Operative Complications     Patient will remain free from symptoms of infection-post op Maintaining        Post-Operative Hemodynamic Stability     Maintain Hemodynamic Stability Maintaining        Safety     Patient will remain free of falls Maintaining          Bowel Elimination     Elimination patterns are normal or improving Progressing towards goal        Mobility     Functional status is maintained or improved - Geriatric Progressing towards goal        Nutrition     Patient's nutritional status is maintained or improved Progressing towards goal        Pain/Comfort     Patient's pain or discomfort is manageable Progressing towards goal        Post-Operative Bowel Elimination     Elimination pattern is normal or improving Progressing towards goal

## 2017-04-24 NOTE — Progress Notes (Signed)
SICU attending addendum:  Pt seen, examined, and evaluated on multidisciplinary rounds with SICU team.     Amanda Galloway is a 46 y F with Bernice significant to fibromyalgia and pancreatic cancer on methadone for pain management who is now s/p whipple 03/29/17. Her intraoperative and immediate post-operative course were uncomplicated with a brief SICU admission postop for pain management, discharged to floor 1/28. 1/31 her NGT was replaced for nausea and vomiting with concern for gastric outlet obstruction. 2/12 pt developed mental status changes, tachypnea and worsening transaminitis. CT abdomen showed a hematoma compressing portal vein and CBD. She was taken to IR for percutaneous biliary drain. She required intubation for respiratory failure prior to the procedure and was readmitted to SICU postop for management. She developed septic shock requiring fluid resuscitation and pressors. Her sepsis improved and she was weaned from pressors and extubated to HFNC 2/17 am. Post extubation she had increased somnolence and her methadone was held for 24 h, now restarted at a reduced dose.     Improved pulmonary toilet and alertness over the last 24 hr. Went to IR 2/19pm for aspiration and culture of small anterior abdominal fluid collection, gram stain with many GPC in pairs and chains, as well as GNB. Cultures pending. Weaned to [facility].     Physical Exam by Systems:  Gen: NAD, sleeping  HEENT: NCAT, PERRLA  Cardiac: RRR, no MRG  Pulm: Clear anteriorly all lung fields, nl WOB MFNC  Abdom: S, NT, ND, incisions well healed without induration or erythema, anterior abd dressing CDI, no BS appreciated  Extr: WWP +1 edema  Neuro: rouses to voice, A&Ox3, grossly intact    The Problem List was discussed and all plans reviewed by me. I agree with the findings and plan above.   - pulmonary toilet, PT, wean NC as tolerated, goal SpO2>92%  - continue methadone 55m q8h with tylenol ATC and lidoderm patches for incisional pain. Improved  mentation and pain control with this regimen  - currently NPO with medications per DHT. Evaluate ability to advance to liquid diet with surgical team  - start stool softener, last BM prior to admission    Plan discussed with patient and family at bedside as well as surgical team.     This patient is critically ill with at least 1 organ system failure associated with a high probability of imminent or life threatening deterioration. the care I have delivered involved high complexity decision making to assess, manipulate, and support vital system function(s), to treat the vital organ system failure and/or prevent further life threatening deterioration of the patient's condition. All nursing documentation, laboratory data, test results, and radiographs were reviewed and interpreted by me. I have established the management plan for this patient's critical illness and have been immediately available to assist with patient care. my documented total critical care time reflects my own patient care time and does not include teaching or procedure time.   critical care time: I spent 20 minutes personally attending to this patient's critical care needs. This critical care time is exclusive of any time for separately billable procedures.      Signed: LDarin EngelsMD, 04/24/17 @ 1557      SICU Progress Note    Amanda Galloway a 89y F with pKeams Canyonsignificant to fibromyalgia and pancreatic cancer on methadone for pain management who is now s/p whipple 03/29/17. Her intraoperative and immediate post-operative course were uncomplicated with a brief SICU admission postop for pain management, discharged to floor  1/28. 1/31 her NGT was replaced for nausea and vomiting with concern for gastric outlet obstruction. 2/12 pt developed mental status changes, tachypnea and worsening transaminitis. CT abdomen showed a hematoma compressing portal vein and CBD. She was taken to IR for percutaneous biliary drain. She required intubation for  respiratory failure prior to the procedure and was readmitted to SICU postop for management. She developed septic shock requiring fluid resuscitation and pressors. Her sepsis improved and she was weaned from pressors and extubated to HFNC 2/17 am.    Interval History: Methadone dosing adjusted yesterday.  IR yesterday for aspiration of anterior abdomoinal fluid collection - gram stain with GNB and GPC in pairs and chains    Past Med/Sx History:   Past Medical History:   Diagnosis Date    Acute kidney failure 03/31/2017    Cancer     Depression     Fibromyalgia     Hypothyroidism        Physical Exam by Systems:  Please see attending exam.    Assessment and Plan for Active/Followed Hospital Problems:   Active Hospital Problems    Diagnosis    *!*Pancreatic adenocarcinoma s/p Whipple 03/29/17     -Whipple on 1/25 with Dr. Ron Agee  [facility] course c/b gastric outlet obstruction d/t severe compression of the main portal vein and common bile duct by hematoma, PTC drain 2/12 in IR  - Zosyn 2/12 - 2/18  - stop Decadron  -Continue TPN with SSI for glucose control  2/19- Zosyn course extended to 14 days, methadone d/c'd   - CT 2/18 with persistent fluid collection   - IR for drainage, 60 mL   - Gram stain with GNB and GPC in pairs and chains - speciation pending      Sepsis     - Occurring in setting of biliary obstruction s/p PTC external/internal placement for source control  - BCx 2/12 Enterobacter 72hrs to growth, Zosyn (2/12-2/18) Vanco (2/12-2/14), continue zosyn 2/19 for 14 days total  - Levo off   - Goal SBP >90, MAP >60   - Enterobacter cloacae complex      Acute pulmonary insufficiency     - Intubated 2/12 prior to [facility] placement for increased WOB  - Extubated 2/17  - HFNC  - Has diuresed well with lasix, no continued need at this time  - IS and Flutter valve   - Chest PT       hx of Pulmonary embolism     - PE 01/18/17 on CT chest   - This hospitalization has been on therapeutic Lovenox, and Heparin gtt  -  Currently holding therapeutic anticoagulation in setting of hematoma  - Heparin SQ TID      Acute blood loss anemia     - H/H stable  - Last transfusion 2/14  - Will monitor and transfuse prn        Cancer associated pain     Methadone at home  Pain management guided by patient's primary pain physician Dr Irven [place]  - methadone at 34m IV BID (09:00 & 21:00) and 7.5 mg IV qDay @ 13:00  - 2/15 increase methadone to 7.514mand 1025m- QTc acceptable, monitor for prolongation  - consider PCA when appropriate  - Held methadone for sedation (2/18), restarted 5mg63m q8h 2/19      Depression     -Continue Elavil and Lexapro      Acute kidney injury     - Slight elevation in Cr  with active diuresis  - Has now corrected and WNL      Hypothyroidism     -Synthroid          Diagnoses for this hospitalization include:  Acute kidney injury without ATN       ---SICU Critical Care  Bundle discussed with nursing---      Author: Peyton Najjar, NP  as of: 04/24/2017  at: 7:44 AM

## 2017-04-24 NOTE — Progress Notes (Signed)
Respiratory Therapy  Weekly Summary Note    Date: 04/24/2017                         Time: 2:04 AM     Reassessment    Subjective   Pt on [place]    Objective   Patient Vitals for the past 12 hrs:   BP Temp Temp src Pulse Resp SpO2   04/24/17 0200 - - - 85 19 99 %   04/24/17 0100 - - - 85 18 100 %   04/24/17 0000 - 36.4 C (97.5 F) TEMPORAL 93 19 99 %   04/23/17 2300 - - - 88 20 99 %   04/23/17 2200 - - - 83 19 100 %   04/23/17 2100 - - - 79 14 100 %   04/23/17 2001 - - - - - 100 %   04/23/17 2000 - 36.2 C (97.2 F) TEMPORAL 83 19 100 %   04/23/17 1900 - - - 81 18 100 %   04/23/17 1800 - - - 89 21 95 %   04/23/17 1752 - - - - - 97 %   04/23/17 1738 - - - - - 100 %   04/23/17 1710 131/69 - - 86 22 100 %   04/23/17 1700 120/60 - - 93 24 100 %   04/23/17 1650 118/58 - - 89 22 100 %   04/23/17 1640 124/60 - - 91 (!) 31 100 %   04/23/17 1635 - - - - - 100 %   04/23/17 1630 124/64 - - 92 (!) 27 100 %   04/23/17 1600 - 36.2 C (97.2 F) TEMPORAL 90 (!) 28 -   04/23/17 1559 - - - - - 98 %   04/23/17 1500 - - - 93 23 98 %       Lung Sounds:   Cough present: Weak  Respiratory Pattern: Assisted  Bilateral Breath Sounds: Diminished  Sputum Color: Unable to assess  Sputum Amount: Unable to assess  Sputum Consistency: Unable to assess     RESPIRATORY / METABOLIC / BLOOD LEVELS:    Respiratory / Metabolic  pH, ABG: 9.56  pCO2, ABG (mmHG): (!) 28 mmHG  pO2, ABG (mmHG): (!) 104 mmHG  SaO2, ABG (%): 97 %  pH, VBG: 7.38  pCO2, VBG (mmHG): (!) 35 mmHG  pO2, VBG (mmHG): 29 mmHG  SvO2, VBG (%): (!) 56 %  Lactate: (!) 1.1 mmHG    Blood Levels  Hemoglobin: (!) 9.4  Platelets: 340  Albumin: (!) 2.5                        Ventilator Settings:   Ventilator: BiPAP/CPAP  MODE: Bi-PAP  FiO2: 40 %  S RR: 4  IT: 0.8 sec  Rise Time/Slope: 2  IPAP: 10 cmH2O  CPAP: 5 cmH2O  NIV Interface : Face Mask  Nasal Barrier Applied : Comment Comment: for procedure  NIV Barrier Skin Assesement : clean, dry, intact  A RR: 26  PIP: 11 cm H2O  MV: 15.8 l/min  BiPAP  Volume Delivered: 610 ml  Leak Amount: 36 ml      Other Relevant Findings:  None    Assessment  [name] with pmh significant to fibromyalgia and pancreatic cancer, who is now s/p whipple.     Plan  Follow plan of provider     Cordelia Pen 2:04 AM 04/24/2017

## 2017-04-24 NOTE — Progress Notes (Signed)
Report Given To  Lattie Haw, RN      Descriptive Sentence / Reason for Admission   PMHx: pancreatic adenocarcinoma    1/25 s/p open Whipple. She is being admitted to SICU for post-operative monitoring, notably hemodynamic status and post-op pain control.      Active Issues / Relevant Events   2/8: upper GI series done  2/12: Continued confusion/pain on CC5, blood/urine culture sent. CT abd done; hematoma near common bile duct found; IR for biliary drain. Intubated for inc RR. Transferred to [address]00. 2L IVF for HoTn.   2/13N: 5.5 L for HoTN and Levo; A-line and CVC placed. DAYS: ECHO (bilateral pleural effusions), IV methadone restarted  2/14N: 2.5L p-lyte, levo off DAYS: PEEP weaned throughout the day, 1 uPRBC for H/H 6.9/21; MIVF d/c'd; Fi02 40% --> 100% d/t acute episode of hypoxia during repositioning in the evening.  2/15N: CXR done. Vent weaned from 100% at start of shift. Mediport de-accessed.  2/15D Methadone dose increased (plan to wean fentanyl). CXR showing worsening L pleural effusion. Lasix gtt initiated. VSS. Blood cultures positive for Klebsiella enterobacter.   2/16N: Fentanyl weaned. 65mEq K x3.   2/16D Tolerated PS for 3 hours on 50% 8/8. Lasix gtt off, diamox started  2/17: Lasix restarted  2/17 D: extubated to HFNC, OOB to chair, increased FiO2 for desat into high 80s, worked with PT, back to bed with Clarise Cruz lift, lethargic this afternoon (very delayed responses, arousable but falls back asleep quickly, needs lots of prompting/multiple requests to follow commands), ABG - pCO2 36, pO2 66. Hold evening dose of methadone.   2/18N: Lethargic; held nightly dose of Methadone  2/18D: Methadone d/c'ed for lethargy. Pulm toilet with bed percussion. CT for possible obstruction.   2/19N: Pain control issues; Dilaudid x4 given. Added tylenol, lidocaine patch and methadone for pain control. Went to IR for aspiration of 60cc of fluid collection, sent for cx.  2/20D: OOB to chair with Clarise Cruz lift, NG pulled, daily enema  started. Weaned to 4L      To Do List   Goal: SBP > 90 or MAP > 60   Flush biliary drain BID w/ 5cc NS (2000 and 0800)    Please remember to complete care plan and education record at 0400 and 1600 hours      Anticipatory Guidance / Discharge Planning  [facility] Prevention: LIJ CVC, L brachial a-line, Mediport (de-accessed) , PIV x1, biliary drain, Foley     Daughter updated at bedside 2/20

## 2017-04-24 NOTE — Progress Notes (Addendum)
Surgical Oncology/Hepatobiliary Surgery Progress Note     LOS: 26 days     Subjective:    Underwent IR percutaneous aspiration of anterior abdominal fluid collection yesterday.  Gram stain positive for GPC and GNB.  Remains on [place].    Objective:    Vitals Sign Ranges for Past 24 Hours:  BP: (118-131)/(58-69)   Temp:  [35.7 C (96.3 F)-36.4 C (97.5 F)]   Temp src: Temporal (02/20 0800)  Heart Rate:  [79-107]   Resp:  [14-31]   SpO2:  [95 %-100 %]       Physical Exam:    General Appearance: in NAD, on [place]  HEENT: NGT with bilious fluid output  Cardiac: regular rate  Respiratory: [place] in place, non-labored breathing  Abdomen: soft, mildly tender, pre-existing ecchymosis, biliary tube with bilious output  Extremities: warm    Labs:    CBC:    Recent Labs  Lab 04/23/17  2343 04/22/17  2354  04/21/17  2348  04/21/17  0005  04/19/17  2329  04/19/17  0017   WBC 18.8* 19.1*  --  15.5*  --  15.5*  --  22.1*  --  22.7*   Hemoglobin 8.9*   9.4* 9.3*   9.8*  < > 8.7*  < > 8.1*   8.3*  < > 8.2*  < > 7.7*   7.9*   Hematocrit 27* 28*  --  26*  --  24*  --  24*  --  22*   Platelets 340 371*  --  353  --  309  --  335  --  298   < > = values in this interval not displayed.    Metabolic Panel:    Recent Labs  Lab 04/23/17  2343 04/22/17  2354 04/21/17  2348 04/21/17  1201 04/21/17  0005 04/20/17  1210 04/19/17  2329 04/19/17  0017   Sodium 135 132* 137 137 137 137 138 135   Potassium 3.7 3.8 3.7 3.7 4.0 3.7 3.4 3.9   Chloride 101 98 97 96 97 96 97 99   CO2 19* 19* '24 25 26 ' 30* 27 23   UN 41* 47* 52* 45* 39* 28* 24* 22*   Creatinine 0.92 0.95 1.11* 1.10* 1.00* 1.00* 0.85 0.80   Glucose 125* 134* 155* 143* 165* 111* 135* 187*   Calcium 8.1* 7.9* 8.4* 8.8 8.6 9.1 8.8 8.9   Magnesium 2.2 2.1 2.0  --  1.8  --  1.6 2.2   Phosphorus 3.7 3.6 5.5*  --  4.5  --  3.0 3.1          Assessment:    72 y.o. female with h/o recent PE and pancreatic cancer POD # 26  status post whipple procedure complicated by delayed gastric emptying.  NGT  replaced on 04/04/17. UGI on 04/12/17 concerning for obstruction just beyond the [place] in the efferent limb.  Course complicated on 8/75/64 by rising LFTs and sepsis with CT concerning for cholangitis given biliary tree obstruction and PV compression due to hematoma and afferent limb distention in setting of PE treatment. Is s/p IR PTC on 04/16/17 and IR percutaneous aspiration of anterior abdominal fluid collection on 04/24/17.    Plan:    - NPO, TPN  - NGT to LIWS  - F/u cultures of anterior abdominal fluid  - PTC/biliary drain to gravity  - IV Zosyn x 14 days given pt had bacteremia; consider re-culturing port and peripherally given persistent leukocytosis  - SCDs,  heparin SC (hold therapeutic anticoagulation) for VTE ppx  - Appreciate SICU care      [name], MD  04/24/2017     8:26 AM  General Surgery Resident    HPB-GI Attending Addendum:   I personally examined the patient, reviewed the notes, and discussed the plan of care with the residents and patient. Agree with detailed resident's note. Please see the note above for details of history, exam, labs, assessment/plan which reflect my input.      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      Gastrojejunostomy obstruction resolved. Nasogastric tube removed.    afferent limb bowel obstruction due to hematoma. CT indicates improvement.    Hyperglycemia without need for insulin. Monitor.   Post operative anemia. No evidence of bleeding. Dilutional.   Volume overload. Diuresis with lasix.  Pulmonary insufficiency with high flow nasal cannula.   Bilateral pleural effusions with lung compression.   Hypoalbuminemia. Malnutrition.   Somnolence. Improving.   Post operative anemia. Stable. Monitor.   Leukocytosis with resolving bandemia from cholangitis with bacteremia. Continue antibiotics for 14 days.   CT scan with passage of contrast. Small fluid  collection in superior abdomen, will ask for IR aspiration.      Delano Metz, MD, FACS  Hepatobiliary, Pancreas & GI Surgery

## 2017-04-24 NOTE — Progress Notes (Signed)
Report Given To  [name], RN      Descriptive Sentence / Reason for Admission   PMHx: pancreatic adenocarcinoma    1/25 s/p open Whipple. She is being admitted to SICU for post-operative monitoring, notably hemodynamic status and post-op pain control.      Active Issues / Relevant Events   2/8: upper GI series done  2/12: Continued confusion/pain on CC5, blood/urine culture sent. CT abd done; hematoma near common bile duct found; IR for biliary drain. Intubated for inc RR. Transferred to [address]00. 2L IVF for HoTn.   2/13N: 5.5 L for HoTN and Levo; A-line and CVC placed. DAYS: ECHO (bilateral pleural effusions), IV methadone restarted  2/14N: 2.5L p-lyte, levo off DAYS: PEEP weaned throughout the day, 1 uPRBC for H/H 6.9/21; MIVF d/c'd; Fi02 40% --> 100% d/t acute episode of hypoxia during repositioning in the evening.  2/15N: CXR done. Vent weaned from 100% at start of shift. Mediport de-accessed.  2/15D Methadone dose increased (plan to wean fentanyl). CXR showing worsening L pleural effusion. Lasix gtt initiated. VSS. Blood cultures positive for Klebsiella enterobacter.   2/16N: Fentanyl weaned. 61mEq K x3.   2/16D Tolerated PS for 3 hours on 50% 8/8. Lasix gtt off, diamox started  2/17: Lasix restarted  2/17 D: extubated to HFNC, OOB to chair, increased FiO2 for desat into high 80s, worked with PT, back to bed with Clarise Cruz lift, lethargic this afternoon (very delayed responses, arousable but falls back asleep quickly, needs lots of prompting/multiple requests to follow commands), ABG - pCO2 36, pO2 66. Hold evening dose of methadone.   2/18N: Lethargic; held nightly dose of Methadone  2/18D: Methadone d/c'ed for lethargy. Pulm toilet with bed percussion. D/C'ed zosyn. CT for possible obstruction.   2/19N: Pain control issues; Dilaudid x4 given. Added tylenol, lidocaine patch and methadone for pain control. Went to IR for aspiration of 60cc of fluid collection, sent for cx.      To Do List   Goal: SBP > 90 or MAP >  60   Flush biliary drain BID w/ 5cc NS (2000 and 0800)    Please remember to complete care plan and education record at 0400 and 1600 hours      Anticipatory Guidance / Discharge Planning  [facility] Prevention: NGT, LIJ CVC, L brachial a-line, Mediport (de-accessed) , PIV x1, biliary drain, Foley     Daughter updated at bedside 2/19

## 2017-04-24 NOTE — Progress Notes (Signed)
Report Given To  Levada Dy, RN      Descriptive Sentence / Reason for Admission   PMHx: pancreatic adenocarcinoma    1/25 s/p open Whipple. She is being admitted to SICU for post-operative monitoring, notably hemodynamic status and post-op pain control.      Active Issues / Relevant Events   2/8: upper GI series done  2/12: Continued confusion/pain on CC5, blood/urine culture sent. CT abd done; hematoma near common bile duct found; IR for biliary drain. Intubated for inc RR. Transferred to [address]00. 2L IVF for HoTn.   2/13N: 5.5 L for HoTN and Levo; A-line and CVC placed. DAYS: ECHO (bilateral pleural effusions), IV methadone restarted  2/14N: 2.5L p-lyte, levo off DAYS: PEEP weaned throughout the day, 1 uPRBC for H/H 6.9/21; MIVF d/c'd; Fi02 40% --> 100% d/t acute episode of hypoxia during repositioning in the evening.  2/15N: CXR done. Vent weaned from 100% at start of shift. Mediport de-accessed.  2/15D Methadone dose increased (plan to wean fentanyl). CXR showing worsening L pleural effusion. Lasix gtt initiated. VSS. Blood cultures positive for Klebsiella enterobacter.   2/16N: Fentanyl weaned. 65mEq K x3.   2/16D Tolerated PS for 3 hours on 50% 8/8. Lasix gtt off, diamox started  2/17: Lasix restarted  2/17 D: extubated to HFNC, OOB to chair, increased FiO2 for desat into high 80s, worked with PT, back to bed with Clarise Cruz lift, lethargic this afternoon (very delayed responses, arousable but falls back asleep quickly, needs lots of prompting/multiple requests to follow commands), ABG - pCO2 36, pO2 66. Hold evening dose of methadone.   2/18N: Lethargic; held nightly dose of Methadone  2/18D: Methadone d/c'ed for lethargy. Pulm toilet with bed percussion. CT for possible obstruction.   2/19N: Pain control issues; Dilaudid x4 given. Added tylenol, lidocaine patch and methadone for pain control. Went to IR for aspiration of 60cc of fluid collection, sent for cx.  2/20D: OOB to chair with Clarise Cruz lift, NG pulled, daily enema  started. Weaned to 4L      To Do List   Goal: SBP > 90 or MAP > 60   Flush biliary drain BID w/ 5cc NS (2000 and 0800)    Please remember to complete care plan and education record at 0400 and 1600 hours      Anticipatory Guidance / Discharge Planning  [facility] Prevention: LIJ CVC, L brachial a-line, Mediport (de-accessed) , PIV x1, biliary drain, Foley     Daughter updated at bedside 2/20

## 2017-04-24 NOTE — Consults (Signed)
Medical Nutrition Therapy - Follow Up, TPN check    Admit Date: 03/29/2017    Patient Summary: [name] with PMH pulmonary embolism and as noted below, dx pancreatic adenocarcinoma s/p FOLFIRINOX chemo with neoadjuvant SBRT (see Oncology notes for full hx), admitted for Whipple completed 03/29/17; post-op course c/b concern for GOO distal to the gastro-jejunostomy site. CT showing hematoma with main portal vein and common bile duct compression. TPN started 04/09/17. Pt transferred to SICU 04/16/17 for severe biliary sepsis, septic shock, shock liver, acute respiratory failure, acute blood loss anemia, pain r/t cancer, and hypotension. S/p IR percutaneous bilary drain placed 04/16/17.  Pt extubated 2/17, currently on [place].  S/P percutaneous aspiration of abdominal fluid collection on 2/19, gram stain positive for GPC and GNB.     Past Medical History:   Diagnosis Date    Acute kidney failure 03/31/2017    Cancer     Depression     Fibromyalgia     Hypothyroidism      Past Surgical History:   Procedure Laterality Date    CHOLECYSTECTOMY      CHOLECYSTECTOMY, LAPAROSCOPIC  09/06/2016    HYSTERECTOMY  08/1986    Fibroids    KNEE SURGERY Right     PR INSERT TUNNELED CV CATH WITH PORT Right 10/04/2016    Procedure: Right IJ MEDIPORT Insertion;  Surgeon: Delano Metz, MD;  Location: [facility] MAIN OR;  Service: Oncology General    PR LAP,DIAGNOSTIC ABDOMEN N/A 10/04/2016    Procedure: LAPAROSCOPY DIAGNOSTIC;  Surgeon: Delano Metz, MD;  Location: [facility] MAIN OR;  Service: Oncology General    PR PART [place] PANC,PROX+REMV DUOD+ANAST N/A 03/29/2017    Procedure: WHIPPLE PROCEDURE;  Surgeon: Delano Metz, MD;  Location: [facility] MAIN OR;  Service: Oncology General    ROTATOR CUFF REPAIR Right     TONSILLECTOMY AND ADENOIDECTOMY       Pertinent Social Hx:  Divorced,resides in [place], [place].  Supportive son and daughter.     Pertinent Meds: reviewed, of note:    Scheduled Meds:   acetaminophen  1,000 mg Oral Q8H    methadone  5 mg Oral Q8H     heparin (porcine)  5,000 Units Subcutaneous Q8H    insulin regular  0-20 Units Subcutaneous Q6H    pantoprazole  40 mg Intravenous Q24H    piperacillin-tazobactam  3.375 g Intravenous Q6H    levothyroxine  137 mcg Oral Daily    amitriptyline  25 mg Oral Nightly    escitalopram  20 mg Oral Nightly   Continuous Infusions:   TPN (2 in 1) Adult Continuous      And    fat emulsion soybean oil      TPN (2 in 1) Adult Continuous 75 mL/hr at 04/24/17 0859   PRN Meds:.lidocaine, HYDROmorphone PF, heparin lock flush, ondansetron, naloxone    Pertinent Labs: reviewed (2/19)   Na+ 135 WNL, whole blood Na+ low but improved, 132   UN high 41, improved, other renal fx labs WNL   BGs high 125-172, insulin coverage and protocol in place   Corrected ICA low 4.7, improved; last repletion outside of TPN was 2/14   Phos improved, WNL   t bili WNL; continuing improvement in LFTs   TG high 172 on 2/19   Anemic, MCV WNL; anemia panel not checked.  TPN provides J88 and folic acid but not iron.  Pt receiving intermittent transfusion support.  RBCs last given on 2/14 per I&Os.    Vit D  level not checked.  TPN does not provide full DRI of vit D.     Vitals: Blood pressure 131/69, pulse 89, temperature 36.3 C (97.3 F), temperature source Temporal, resp. rate 22, height 1.702 m (5\' 7" ), weight 73.5 kg (162 lb), SpO2 100 %.    Reviewed I/O's 2/19   3.4 L UOP    620 ml biliary drain output   150 ml NG output    No BMs    I/O last 3 days:  02/17 1500 - 02/20 1459  In: 7321.5 (99.6 mL/kg) [I.V.:4.4; Other:35; NG/GT:720; IV Piggyback:633.3]  Out: 11075 (150.7 mL/kg) [Urine:9145; Emesis/NG output:1930]  Net: -3753.6  Weight: 73.5 kg     Access:  PIV, triple lumen PCVC, single port IVAD, NG tube for gastric decompression    Nutrition Hx:    Pt reported she was primarily only eating cottage cheese with mandarin oranges prior to admission. She would have have this 4x daily and drink Ensure (believes it was Ensure plus) ~TID. She  stated she didn't eat much else for weeks. Reports that eating was "awful" after the surgery and had only been able to eat very small amounts.   NPO since 1/31; TPN started 04/09/17 (10 days after admission)     Food allergies: NKFA    Current diet: NPO   Supplements: none    Nutrition Focused Physical Exam:  Edema: 2+ general, trace BUE, 1+ BLE per shift assessment (improved)  Abdomen: distended, hypoactive BS per shift assessment  Skin: bruising, abdominal surgical wound per shift assessment    Anthropometrics:  Height: 170.2 cm (5\' 7" )    Weight: 73.5 kg (162 lb)  Ideal Body Weight: 72.4 kg + 10%  Weight is 101.5 % of IBW  BMI (Calculated): 25.4 kg/(m^2) healthy range for age  Weight Hx: stable x 5 months PTA; edema may be masking acute weight loss   04/17/2017 73.483 kg --with 1+ general, BUE, BLE edema    04/09/2017 73.483 kg Actual   03/29/2017 72 kg Actual admit weight with 1+ general, BLE edema    03/22/2017 71.215 kg --   03/11/2017 73 kg  --No edema per Surgical Oncology clinic note    03/06/2017 72.349 kg --   03/01/2017 73.755 kg --   02/19/2017 72.576 kg Actual   01/29/2017 73.573 kg Actual   01/28/2017 73 kg Actual   12/14/2016 73.256 kg --   11/16/2016 69.899 kg --   10/30/2016 67.586 kg Actual   10/05/2016 71.3 kg Actual     Estimated Nutrient Needs: (Based on admit weight 72 kg)    1800-2160 kcal/day (25-30 kcal/kg)   110-145 g protein/day (1.5-2.0 g/kg)    1800-2160 mL fluid/day (25-30 mL/kg) or per team recs     Nutrition Assessment and Diagnosis:   TPN is ordered for tonight as follows:  2-in-1 TPN with lipids via adult central line; amino acid-dextrose solution @ 75 ml/hr x 24 hrs (1800 ml fluid per day), with:   Amino acids 70 g/L  Dextrose 150 g/L   Na+ 105 meq/L   K+ 30 meq/L  Ca++ 9 meq/L  Mg 5 meq/L  P 12 mmole/L  Standard MVI  Standard trace elements  1 mg folic acid  824 mg thiamine  Chloride: Acetate ratio- max chloride  AND: 60 g/day lipids (300 ml/day 20% soy lipid emulsion, given over 12  hours)  TPN with lipids provides a total of 2100 ml fluid, 2022 kcal, 126 g protein and 270 g dextrose per day.  Phos level and renal fx labs improved with d/c of lasix drip.  LFTs continue to improve.  Outputs increased per I&Os; pt received a 1x dose of lasix with good response.     Malnutrition Status: Pt with evidence of moderate malnutrition per nutrition note of 04/09/17; pt has received PN support since 04/09/17.      Nutrition Intervention:   1. TPN Initiation  The Clinical Nutrition Specialist agrees TPN is appropriate for this patient. Most recently reviewed at [place] on 04/17/17.  Original Start Date: 2/5  D/C Plan:  TPN will be discontinued once GOO resolves     2.  For Thursday 04/25/17: recommend 2-in-1 TPN with lipids, via adult central line; amino acid dextrose solution @ 75 ml/hr x 24 hrs (1800 ml fluid per day), with: (no changes)  Amino acids 70 g/L  Dextrose 150 g/L   Na+ 105 meq/L  K+ 30 meq/L  Ca++ 9 meq/L  Mg 5 meq/L  Phos 12 mmol/L   Standard MVI  Standard trace elements  Chloride: Acetate ratio- max chloride.  AND: 60 g/day lipids (300 ml/day 20% soy lipid emulsion, given over 12 hours)  TPN with lipids will provide a total of 2100 ml fluid, 2022 kcal, 126 g protein and 270 g dextrose/day.     3.  Please continue parenteral monitoring panel. Recommended Monitoring Parameters:               PO4, Mg, and basic metabolic panel daily for 3 days or until stable, then 3 times per week. Replete lytes via IV as needed.                Comprehensive metabolic panel and triglycerides weekly. Hold lipids if TG>400.                Check BGs q 6 hours until stable on goal dextrose.                Monitor weight daily until fluid balance is stable, then weekly.               Accurate I&Os.      4.  Consider checking vit D3 level.  TPN does not provide full DRI of vit D.  Renal/liver injuries increase risk of deficiency.      Nutrition Monitoring/Evaluation:   1. Will monitor TPN tolerance,  nutrition-related labs, weight trend, BM pattern.  2. Nutrition to follow up per high nutrition risk protocol.    Serita Butcher, RD, [place] pager# [phone]

## 2017-04-24 NOTE — Progress Notes (Signed)
---  Critical Care Bundle--    Fluids & Electrolytes: No continuous, replete as needed  Nutrition / last BM: TPN @75mls /hr with Lipids 12hours, NPO with advance diet? Last BM unknown  Mobility plan: OOB with Clarise Cruz lift, PT following  Neuro / sedation / pain / spontaneous awakening:prn dilaudid, ATC methadone , ATC tylenol, lidocaine patch  Respiratory weaning (and PST, TC) / pulmonary toilet: Pulmonary toilet, Medium flow O2 8L  Delirium / sleep / prevention / intervention: CAM (+)  List of antimicrobials: Zosyn  Drains / Lines / Tubes: NGT, LIJ CVC, L brachial a-line, Mediport (de-accessed) , PIV x1, biliary drain, Foley   Indication for foley: I&O  DVT prophylaxis / a PTT: SQ heparin, ICP, APTT 33.6  Indications for gastric acid suppression: Protonix  Labs ordered:  Q6 hours: BG   Daily: BMP, ABG, CBB with diff, CMP, Mg, Ph, APTT, PT/INR   Tryglicerides Three times week  Family concern / updates: Family updated 2/19  Patient likes to be called: Amanda Galloway  Code status: FULL  Disposition / Level: ICU  Nursing reiterate plan for day:  Yes  Author: Mattie Marlin, RN  as of: 04/24/2017 At: 7:29 AM

## 2017-04-25 LAB — ART GASES / WHOLE BLOOD PANEL
Base Excess, Arterial: -4 mmol/L — ABNORMAL LOW (ref <=2)
CO2,ART (Calc): 19 mmol/L — ABNORMAL LOW (ref 23–28)
CO: 0.4 %
FO2 Hb, Arterial: 95 % (ref 90–100)
Glucose,WB: 116 mg/dL — ABNORMAL HIGH (ref 60–99)
HCO3, Arterial: 18 mmol/L — ABNORMAL LOW (ref 21–26)
Hemoglobin: 10.6 g/dL — ABNORMAL LOW (ref 11.2–15.7)
ICA @7.4,WB: 4.7 mg/dL — ABNORMAL LOW (ref 4.8–5.2)
ICA Uncorr,WB: 4.6 mg/dL
Lactate ART,WB: 0.9 mmol/L — ABNORMAL HIGH (ref 0.3–0.8)
Methemoglobin: 0.3 % (ref 0.0–1.0)
NA, WB: 129 mmol/L — ABNORMAL LOW (ref 135–145)
Potassium,WB: 3.7 mmol/L (ref 3.4–4.7)
pCO2, Arterial: 27 mm Hg — ABNORMAL LOW (ref 35–46)
pH: 7.46 — ABNORMAL HIGH (ref 7.35–7.45)
pO2,Arterial: 82 mm Hg (ref 80–100)

## 2017-04-25 LAB — CBC AND DIFFERENTIAL
Baso # K/uL: 0 10*3/uL (ref 0.0–0.1)
Basophil %: 0 %
Eos # K/uL: 0.6 10*3/uL — ABNORMAL HIGH (ref 0.0–0.4)
Eosinophil %: 3.5 %
Hematocrit: 27 % — ABNORMAL LOW (ref 34–45)
Hemoglobin: 8.9 g/dL — ABNORMAL LOW (ref 11.2–15.7)
Lymph # K/uL: 1.5 10*3/uL (ref 1.2–3.7)
Lymphocyte %: 7.9 %
MCH: 29 pg/cell (ref 26–32)
MCHC: 33 g/dL (ref 32–36)
MCV: 88 fL (ref 79–95)
Mono # K/uL: 0 10*3/uL — ABNORMAL LOW (ref 0.2–0.9)
Monocyte %: 0 %
Neut # K/uL: 14.2 10*3/uL — ABNORMAL HIGH (ref 1.6–6.1)
Nucl RBC # K/uL: 0 10*3/uL (ref 0.0–0.0)
Nucl RBC %: 0 /100 WBC (ref 0.0–0.2)
Platelets: 383 10*3/uL — ABNORMAL HIGH (ref 160–370)
RBC: 3.1 MIL/uL — ABNORMAL LOW (ref 3.9–5.2)
RDW: 18.6 % — ABNORMAL HIGH (ref 11.7–14.4)
Seg Neut %: 81.5 %
WBC: 16.5 10*3/uL — ABNORMAL HIGH (ref 4.0–10.0)

## 2017-04-25 LAB — COMPREHENSIVE METABOLIC PANEL
ALT: 40 U/L — ABNORMAL HIGH (ref 0–35)
AST: 20 U/L (ref 0–35)
Albumin: 2.6 g/dL — ABNORMAL LOW (ref 3.5–5.2)
Alk Phos: 257 U/L — ABNORMAL HIGH (ref 35–105)
Anion Gap: 14 (ref 7–16)
Bilirubin,Total: 0.5 mg/dL (ref 0.0–1.2)
CO2: 19 mmol/L — ABNORMAL LOW (ref 20–28)
Calcium: 7.9 mg/dL — ABNORMAL LOW (ref 8.6–10.2)
Chloride: 99 mmol/L (ref 96–108)
Creatinine: 0.77 mg/dL (ref 0.51–0.95)
GFR,Black: 90 *
GFR,Caucasian: 78 *
Glucose: 114 mg/dL — ABNORMAL HIGH (ref 60–99)
Lab: 36 mg/dL — ABNORMAL HIGH (ref 6–20)
Potassium: 3.9 mmol/L (ref 3.3–5.1)
Sodium: 132 mmol/L — ABNORMAL LOW (ref 133–145)
Total Protein: 6 g/dL — ABNORMAL LOW (ref 6.3–7.7)

## 2017-04-25 LAB — DIFF MANUAL
Bands %: 4 % (ref 0–10)
Diff Based On: 114 CELLS
Metamyelocyte %: 1 % (ref 0–1)
Myelocyte %: 2 % — ABNORMAL HIGH (ref 0–0)
React Lymph %: 1 % (ref 0–6)

## 2017-04-25 LAB — PROTIME-INR
INR: 1.2 — ABNORMAL HIGH (ref 0.9–1.1)
Protime: 13.5 s — ABNORMAL HIGH (ref 10.0–12.9)

## 2017-04-25 LAB — MAGNESIUM: Magnesium: 2.2 mg/dL (ref 1.6–2.5)

## 2017-04-25 LAB — APTT: aPTT: 30.7 s (ref 25.8–37.9)

## 2017-04-25 LAB — POCT GLUCOSE
Glucose POCT: 112 mg/dL — ABNORMAL HIGH (ref 60–99)
Glucose POCT: 117 mg/dL — ABNORMAL HIGH (ref 60–99)
Glucose POCT: 124 mg/dL — ABNORMAL HIGH (ref 60–99)
Glucose POCT: 125 mg/dL — ABNORMAL HIGH (ref 60–99)
Glucose POCT: 139 mg/dL — ABNORMAL HIGH (ref 60–99)

## 2017-04-25 LAB — PHOSPHORUS: Phosphorus: 2.8 mg/dL (ref 2.7–4.5)

## 2017-04-25 LAB — RBC MORPHOLOGY

## 2017-04-25 LAB — TRIGLYCERIDES: Triglycerides: 186 mg/dL — AB

## 2017-04-25 MED ORDER — SODIUM CHLORIDE 0.9 % IV SOLN WRAPPED *I*
70.0000 mg | Freq: Once | INTRAVENOUS | Status: AC
Start: 2017-04-25 — End: 2017-04-25
  Administered 2017-04-25: 70 mg via INTRAVENOUS
  Filled 2017-04-25 (×2): qty 10

## 2017-04-25 MED ORDER — HYDROMORPHONE HCL 2 MG/ML IJ SOLN *WRAPPED*
0.5000 mg | INTRAMUSCULAR | Status: DC | PRN
Start: 2017-04-25 — End: 2017-04-26
  Administered 2017-04-25 (×2): 0.5 mg via INTRAVENOUS
  Filled 2017-04-25 (×2): qty 1

## 2017-04-25 MED ORDER — STERILE WATER FOR INJECTION (TPN USE ONLY) *I*
INTRAVENOUS | Status: AC
Start: 2017-04-25 — End: 2017-04-26
  Filled 2017-04-25: qty 840

## 2017-04-25 MED ORDER — LORAZEPAM 2 MG/ML IJ SOLN *I*
INTRAMUSCULAR | Status: DC
Start: 2017-04-25 — End: 2017-04-26
  Filled 2017-04-25: qty 2

## 2017-04-25 MED ORDER — FAT EMULSION SOYBEAN OIL 20 % IV EMUL *WRAPPED*
60.0000 g | Freq: Every day | INTRAVENOUS | Status: AC
Start: 2017-04-25 — End: 2017-04-26
  Administered 2017-04-25 – 2017-04-26 (×12): 60 g via INTRAVENOUS
  Filled 2017-04-25: qty 300

## 2017-04-25 MED ORDER — CASPOFUNGIN 50 MG IN 110 ML NS MINI-BAG (0.5 MG/ML) *I*
50.0000 mg | INTRAVENOUS | Status: DC
Start: 2017-04-26 — End: 2017-05-14
  Administered 2017-04-26 – 2017-05-13 (×18): 50 mg via INTRAVENOUS
  Filled 2017-04-25 (×21): qty 100

## 2017-04-25 NOTE — Plan of Care (Signed)
Cognitive function     Cognitive function will be maintained Maintaining        Impaired Gas Exchange     Patient will maintain adequate oxygenation Maintaining        Mobility     Functional status is maintained or improved - Geriatric Maintaining        Nutrition     Patient's nutritional status is maintained or improved Maintaining        Pain/Comfort     Patient's pain or discomfort is manageable Maintaining        Post-Operative Complications     Patient will remain free from symptoms of infection-post op Maintaining        Post-Operative Hemodynamic Stability     Maintain Hemodynamic Stability Maintaining        Safety     Patient will remain free of falls Maintaining

## 2017-04-25 NOTE — Plan of Care (Signed)
Impaired Gas Exchange     Patient will maintain adequate oxygenation Maintaining        Pt remains on medium flow nasal canula. SpO2 in high 90's. Encouraging IS and flutter valve while awake. Good effort. Cont w/ weak cough but has good effort.     Post-Operative Complications     Patient will remain free from symptoms of infection-post op Maintaining          Post-Operative Hemodynamic Stability     Maintain Hemodynamic Stability Maintaining          Safety     Patient will remain free of falls Maintaining          Bowel Elimination     Elimination patterns are normal or improving Progressing towards goal        Pt on bowel regimen. Did not stool overnite.     Cognitive function     Cognitive function will be maintained Progressing towards goal        Patient confused to time. Forgetful regarding some of her tubes/drains, but reorients easily.     Mobility     Functional status is maintained or improved - Geriatric Progressing towards goal        Pt is very weak. Moves UE slightly to maneuver swabs, comfort. Lies pretty still in bed. Does get OOB during day.     Nutrition     Patient's nutritional status is maintained or improved Progressing towards goal        Pt cont on TPN/Lipids. NG was removed on 2/20. Taking meds w/o difficulty.     Pain/Comfort     Patient's pain or discomfort is manageable Progressing towards goal        Pt c/o abd and generalized discomfort. Restarted on low dose methadone on 2/20. Receiving Tylenol ATC and prn dilaudid. Repositioned as needed.     Post-Operative Bowel Elimination     Elimination pattern is normal or improving Progressing towards goal

## 2017-04-25 NOTE — Consults (Signed)
Medical Nutrition Therapy - Follow Up, TPN check    Admit Date: 03/29/2017     Reason for Consult:  Ongoing hyponatremia, evaluate sodium in TPN    Patient Summary: [name] with PMH pulmonary embolism and as noted below, dx pancreatic adenocarcinoma s/p FOLFIRINOX chemo with neoadjuvant SBRT (see Oncology notes for full hx), admitted for Whipple completed 03/29/17; post-op course c/b concern for GOO distal to the gastro-jejunostomy site. CT showing hematoma with main portal vein and common bile duct compression. TPN started 04/09/17. Pt transferred to SICU 04/16/17 for severe biliary sepsis, septic shock, shock liver, acute respiratory failure, acute blood loss anemia, pain r/t cancer, and hypotension. S/p IR percutaneous bilary drain placed 04/16/17.  Pt extubated 2/17, currently weaned to [facility].  S/P percutaneous aspiration of abdominal fluid collection on 2/19, gram stain positive for GPC and GNB, on abx. NG pulled 2/20, daily enemas started.     Past Medical History:   Diagnosis Date    Acute kidney failure 03/31/2017    Cancer     Depression     Fibromyalgia     Hypothyroidism      Past Surgical History:   Procedure Laterality Date    CHOLECYSTECTOMY      CHOLECYSTECTOMY, LAPAROSCOPIC  09/06/2016    HYSTERECTOMY  08/1986    Fibroids    KNEE SURGERY Right     PR INSERT TUNNELED CV CATH WITH PORT Right 10/04/2016    Procedure: Right IJ MEDIPORT Insertion;  Surgeon: Delano Metz, MD;  Location: [facility] MAIN OR;  Service: Oncology General    PR LAP,DIAGNOSTIC ABDOMEN N/A 10/04/2016    Procedure: LAPAROSCOPY DIAGNOSTIC;  Surgeon: Delano Metz, MD;  Location: [facility] MAIN OR;  Service: Oncology General    PR PART [place] PANC,PROX+REMV DUOD+ANAST N/A 03/29/2017    Procedure: WHIPPLE PROCEDURE;  Surgeon: Delano Metz, MD;  Location: [facility] MAIN OR;  Service: Oncology General    ROTATOR CUFF REPAIR Right     TONSILLECTOMY AND ADENOIDECTOMY       Pertinent Social Hx:  Divorced, resides in [place], [place].  Supportive son and daughter.      Pertinent Meds: reviewed, of note:    Scheduled Meds:   docusate sodium  100 mg Oral 2 times per day    FLEET ENEMA  1 enema Rectal Daily    acetaminophen  1,000 mg Oral Q8H    methadone  5 mg Oral Q8H    heparin (porcine)  5,000 Units Subcutaneous Q8H    insulin regular  0-20 Units Subcutaneous Q6H    pantoprazole  40 mg Intravenous Q24H    piperacillin-tazobactam  3.375 g Intravenous Q6H    levothyroxine  137 mcg Oral Daily    amitriptyline  25 mg Oral Nightly    escitalopram  20 mg Oral Nightly   Continuous Infusions:   TPN (2 in 1) Adult Continuous      And    fat emulsion soybean oil      TPN (2 in 1) Adult Continuous 75 mL/hr at 04/25/17 0659   PRN Meds:.lidocaine, HYDROmorphone PF, heparin lock flush, ondansetron, naloxone    Pertinent Labs: reviewed    Na+ 132 low, whole blood Na+ low 129   CO2 low 19   UN high 36, improved, other renal fx labs WNL   BGs high 114-134, insulin coverage and protocol in place   Corrected ICA low 4.7; no repletion   t bili WNL; continuing improvement in LFTs   TG high 186 on  2/21   Anemic, MCV WNL; anemia panel not checked.  TPN provides U13 and folic acid but not iron.  Pt receiving intermittent transfusion support.  RBCs last given on 2/14 per I&Os.    Vit D level not checked.  TPN does not provide full DRI of vit D.   carnitine came back low on 2/15    Vitals: Blood pressure 131/69, pulse 86, temperature 36 C (96.8 F), resp. rate 18, height 1.702 m (5\' 7" ), weight 73.5 kg (162 lb), SpO2 (S) 98 %.    Reviewed I/O's 2/20   2.3 L UOP    670 ml biliary drain output   100 ml NG output, now removed   BM x 1    I/O last 3 days:  02/18 1500 - 02/21 1459  In: 7080.8 (96.4 mL/kg) [P.O.:200; Other:30; NG/GT:510; IV Piggyback:578.3]  Out: 9600 (130.6 mL/kg) [Urine:7390; Emesis/NG output:2210]  Net: -2519.2  Weight: 73.5 kg     Access:  PIV, triple lumen PCVC, single port IVAD    Nutrition Hx:    Pt reported she was primarily only eating cottage cheese  with mandarin oranges prior to admission. She would have have this 4x daily and drink Ensure (believes it was Ensure plus) ~TID. She stated she didn't eat much else for weeks. Reports that eating was "awful" after the surgery and had only been able to eat very small amounts.   NPO since 1/31-2/19; TPN started 04/09/17 (10 days after admission)    2/19: ADAT   2/21: pt taking sips H2O with meds without issues per RN notes     Food allergies: NKFA    Current diet: NPO-ADAT  Supplements: none    Nutrition Focused Physical Exam:  Edema: 1+ general, trace BUE, 1+ BLE per shift assessment (improved)  Abdomen: distended, constipation, + BS per shift assessment  Skin: bruising, abdominal surgical wound per shift assessment    Anthropometrics:  Height: 170.2 cm (5\' 7" )    Weight: 73.5 kg (162 lb)  Ideal Body Weight: 72.4 kg + 10%  Weight is 101.5 % of IBW  BMI (Calculated): 25.4 kg/(m^2) healthy range for age  Weight Hx: stable x 5 months PTA; edema may be masking acute weight loss   04/17/2017 73.483 kg --with 1+ general, BUE, BLE edema    04/09/2017 73.483 kg Actual   03/29/2017 72 kg Actual admit weight with 1+ general, BLE edema    03/22/2017 71.215 kg --   03/11/2017 73 kg  --No edema per Surgical Oncology clinic note    03/06/2017 72.349 kg --   03/01/2017 73.755 kg --   02/19/2017 72.576 kg Actual   01/29/2017 73.573 kg Actual   01/28/2017 73 kg Actual   12/14/2016 73.256 kg --   11/16/2016 69.899 kg --   10/30/2016 67.586 kg Actual   10/05/2016 71.3 kg Actual     Estimated Nutrient Needs: (Based on admit weight 72 kg)    1800-2160 kcal/day (25-30 kcal/kg)   110-145 g protein/day (1.5-2.0 g/kg)    1800-2160 mL fluid/day (25-30 mL/kg) or per team recs     Nutrition Assessment and Diagnosis:   TPN is ordered for tonight as follows:  2-in-1 TPN with lipids via adult central line; amino acid-dextrose solution @ 75 ml/hr x 24 hrs (1800 ml fluid per day), with:   Amino acids 70 g/L  Dextrose 150 g/L   Na+ 105 meq/L   K+ 30 meq/L  Ca++  9 meq/L  Mg 5 meq/L  P 12  mmole/L  Standard MVI  Standard trace elements  1 mg folic acid  734 mg thiamine  Chloride: Acetate ratio- max chloride  AND: 60 g/day lipids (300 ml/day 20% soy lipid emulsion, given over 12 hours)  TPN with lipids provides a total of 2100 ml fluid, 2022 kcal, 126 g protein and 270 g dextrose per day.     Pt with one BM noted.  Started on an enema regimen.  Taking sips water per RN notes.     Malnutrition Status: Pt with evidence of moderate malnutrition per nutrition note of 04/09/17; pt has received PN support since 04/09/17.      Nutrition Intervention:   1. TPN Initiation  The Clinical Nutrition Specialist agrees TPN is appropriate for this patient. Most recently reviewed at [place] on 04/17/17.  Original Start Date: 2/5  D/C Plan:  TPN will be discontinued once GOO resolves     2.  For Friday 04/25/17: recommend 2-in-1 TPN with lipids, via adult central line; amino acid dextrose solution @ 75 ml/hr x 24 hrs (1800 ml fluid per day), with: (changes in bold)  Amino acids 70 g/L  Dextrose 150 g/L   Na+ 110 meq/L  K+ 30 meq/L  Ca++ 10 meq/L  Mg 5 meq/L  Phos 12 mmol/L   Standard MVI  Standard trace elements  ADD: 200 mg levocarnitine  Chloride: Acetate ratio- 1/3 chloride  AND: 60 g/day lipids (300 ml/day 20% soy lipid emulsion, given over 12 hours)  TPN with lipids will provide a total of 2100 ml fluid, 2022 kcal, 126 g protein and 270 g dextrose/day.     3.  Please continue parenteral monitoring panel. Recommended Monitoring Parameters:               PO4, Mg, and basic metabolic panel daily for 3 days or until stable, then 3 times per week. Replete lytes via IV as needed.                Comprehensive metabolic panel and triglycerides weekly. Hold lipids if TG>400.                Check BGs q 6 hours until stable on goal dextrose.                Monitor weight daily until fluid balance is stable, then weekly.               Accurate I&Os.      4.  Consider checking vit D3 level.  TPN does not  provide full DRI of vit D.  Renal/liver injuries increase risk of deficiency.      Nutrition Monitoring/Evaluation:   1. Will monitor TPN tolerance, nutrition-related labs, weight trend, BM pattern.  2. Nutrition to follow up per high nutrition risk protocol.    Serita Butcher, RD, [place] pager# [phone]

## 2017-04-25 NOTE — Progress Notes (Addendum)
Surgical Oncology/Hepatobiliary Surgery Progress Note     LOS: 27 days     Subjective:    Very sleepy this morning. More awake this afternoon. Quite slow to answer, but does answer appropriately.   + flatus/+BM  No emesis  Would like to "get out of here."    Objective:    Vitals Sign Ranges for Past 24 Hours:  BP: (104-134)/(54-69)   Temp:  [35.8 C (96.4 F)-36.9 C (98.4 F)]   Temp src: Temporal (02/21 1600)  Heart Rate:  [80-93]   Resp:  [15-28]   SpO2:  [96 %-99 %]       Physical Exam:    General Appearance: in NAD, appears catatonic  HEENT: NCAT  Cardiac: regular rate  Respiratory: non-labored breathing  Abdomen: soft, mildly tender, pre-existing ecchymosis, biliary tube with bilious output  Extremities: warm    Labs:    CBC:    Recent Labs  Lab 04/25/17  0117 04/23/17  2343 04/22/17  2354  04/21/17  2348  04/21/17  0005  04/19/17  2329   WBC 16.5* 18.8* 19.1*  --  15.5*  --  15.5*  --  22.1*   Hemoglobin 8.9*   10.6* 8.9*   9.4* 9.3*   9.8*  < > 8.7*  < > 8.1*   8.3*  < > 8.2*   Hematocrit 27* 27* 28*  --  26*  --  24*  --  24*   Platelets 383* 340 371*  --  353  --  309  --  335   < > = values in this interval not displayed.    Metabolic Panel:    Recent Labs  Lab 04/25/17  0117 04/23/17  2343 04/22/17  2354 04/21/17  2348 04/21/17  1201 04/21/17  0005  04/19/17  2329   Sodium 132* 135 132* 137 137 137  < > 138   Potassium 3.9 3.7 3.8 3.7 3.7 4.0  < > 3.4   Chloride 99 101 98 97 96 97  < > 97   CO2 19* 19* 19* _0 < > 27   UN 36* 41* 47* 52* 45* 39*  < > 24*   Creatinine 0.77 0.92 0.95 1.11* 1.10* 1.00*  < > 0.85   Glucose 114* 125* 134* 155* 143* 165*  < > 135*   Calcium 7.9* 8.1* 7.9* 8.4* 8.8 8.6  < > 8.8   Magnesium 2.2 2.2 2.1 2.0  --  1.8  --  1.6   Phosphorus 2.8 3.7 3.6 5.5*  --  4.5  --  3.0   < > = values in this interval not displayed.       Assessment:    72 y.o. female with h/o recent PE and pancreatic cancer POD # 27  status post whipple procedure complicated by delayed gastric emptying.   NGT replaced on 04/04/17. UGI on 04/12/17 concerning for obstruction just beyond the [place] in the efferent limb.  Course complicated on 06/21/60 by rising LFTs and sepsis with CT concerning for cholangitis given biliary tree obstruction and PV compression due to hematoma and afferent limb distention in setting of PE treatment. Is s/p IR PTC on 04/16/17 and IR percutaneous aspiration of anterior abdominal fluid collection on 04/24/17.    WBC slightly improved. Off oxygen.     Plan:    - Can start clears  - TPN  - F/u cultures of anterior abdominal fluid  - PTC/biliary drain to gravity  - IV  Zosyn x 14 days given pt had bacteremia, ? tx candida  - SCDs, heparin SC (hold therapeutic anticoagulation) for VTE ppx  - Appreciate SICU care      Donata Duff, MD  04/25/2017     5:21 PM  General Surgery Resident        HPB-GI Attending Addendum:   I personally examined the patient, reviewed the notes, and discussed the plan of care with the residents and patient. Agree with detailed resident's note. Please see the note above for details of history, exam, labs, assessment/plan which reflect my input.      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      Gastrojejunostomy obstruction resolved. Nasogastric tube removed.    Afferent limb bowel obstruction due to hematoma. Appears improved.   Hyperglycemia without need for insulin. Monitor.   Post operative anemia. No evidence of bleeding.  Volume overload. Diuresis with lasix. Monitor creatinine.  Pulmonary insufficiency improvement.   Bilateral pleural effusions with lung compression.   Hypoalbuminemia. Malnutrition.   Somnolence. Improving. Depression ?   Post operative anemia. Stable. Monitor.   Leukocytosis with slow improvement.        Delano Metz, MD, FACS  Hepatobiliary, Pancreas & GI Surgery

## 2017-04-25 NOTE — Progress Notes (Signed)
SICU attending addendum:  Pt seen, examined, and evaluated on multidisciplinary rounds with SICU team.     [name] is a 35 y F with [place] significant to fibromyalgia and pancreatic cancer on methadone for pain management who is now s/p whipple 03/29/17. Her intraoperative and immediate post-operative course were uncomplicated with a brief SICU admission postop for pain management, discharged to floor 1/28. 1/31 her NGT was replaced for nausea and vomiting with concern for gastric outlet obstruction. 2/12 pt developed mental status changes, tachypnea and worsening transaminitis. CT abdomen showed a hematoma compressing portal vein and CBD. She was taken to IR for percutaneous biliary drain. She required intubation for respiratory failure prior to the procedure and was readmitted to SICU postop for management. She developed septic shock requiring fluid resuscitation and pressors. Her sepsis improved and she was weaned from pressors and extubated to HFNC 2/17 am. Post extubation she had increased somnolence and her methadone was held for 24 h, now restarted at a reduced dose. 2/19 pm she underwent IR guided drainage of a persistent anterior abdominal fluid collection, with 60cc of purulent drainage sent for culture.     24 h events: Pt continues to have acceptable pain control over the last few days with methadone 40m. TID and scheduled tylenol. Engagement with physical therapy and pulmonary toilet improving, weaned to nasal cannula oxygen overnight.     Physical Exam by Systems:  Gen: NAD, sleeping  HEENT: NCAT, PERRLA  Cardiac: RRR, no MrG  Pulm: Clear anteriorly all lung fields, nl WOB MFNC  Abdom: S, NT, ND, incisions well healed without induration or erythema, anterior abd dressing CDI, no BS appreciated  Extr: WWP +1 edema  Neuro: rouses to voice, A&Ox3, grossly intact    The Problem List was discussed and all plans reviewed by me. I agree with the findings and plan above.   - 2/19 abdominal fluid with GPC in  pairs and chains, Candida glabrata and known Enterobacter cloacae. Continue zosyn, will consult infectious disease service to help guide antifungal therapy.   -discontinue arterial line  - DHT removed by surgical team 2/20 pm, start clear liquids and advance diet as tolerated. Calorie counts, continue TPN today  - start bowel regimen with colace, senna  - PT/OT, OOB and encourage pulmonary toilet.     Plan discussed with patient and family at bedside as well as surgical team.     This patient is critically ill with at least 1 organ system failure associated with a high probability of imminent or life threatening deterioration. the care I have delivered involved high complexity decision making to assess, manipulate, and support vital system function(s), to treat the vital organ system failure and/or prevent further life threatening deterioration of the patient's condition. All nursing documentation, laboratory data, test results, and radiographs were reviewed and interpreted by me. I have established the management plan for this patient's critical illness and have been immediately available to assist with patient care. my documented total critical care time reflects my own patient care time and does not include teaching or procedure time.   critical care time: I spent 25 minutes personally attending to this patient's critical care needs. This critical care time is exclusive of any time for separately billable procedures.      Signed: LDarin EngelsMD, 04/25/17 @ 1445      SICU Progress Note    [name] a 73y F with pB[place] to fibromyalgia and pancreatic cancer on methadone for pain  management who is now s/p whipple 03/29/17. Her intraoperative and immediate post-operative course were uncomplicated with a brief SICU admission postop for pain management, discharged to floor 1/28. 1/31 her NGT was replaced for nausea and vomiting with concern for gastric outlet obstruction. 2/12 pt developed mental status  changes, tachypnea and worsening transaminitis. CT abdomen showed a hematoma compressing portal vein and CBD. She was taken to IR for percutaneous biliary drain. She required intubation for respiratory failure prior to the procedure and was readmitted to SICU postop for management. She developed septic shock requiring fluid resuscitation and pressors. Her sepsis improved and she was weaned from pressors and extubated to HFNC 2/17 am.    Interval History: NAE overnight. Enterobacter cloacae prelim from abdominal fluid culture.     Past Med/Sx History:   Past Medical History:   Diagnosis Date    Acute kidney failure 03/31/2017    Cancer     Depression     Fibromyalgia     Hypothyroidism        Physical Exam by Systems:  Please see attending exam.    Assessment and Plan for Active/Followed Hospital Problems:   Active Hospital Problems    Diagnosis    *!*Pancreatic adenocarcinoma s/p Whipple 03/29/17     -Whipple on 1/25 with Dr. Ron Agee  [facility] course c/b gastric outlet obstruction d/t severe compression of the main portal vein and common bile duct by hematoma, PTC drain 2/12 in IR  - Zosyn 2/12 - 2/18  - stop Decadron  -Continue TPN with SSI for glucose control  2/19- Zosyn course extended to 14 days, methadone d/c'd   - CT 2/18 with persistent fluid collection   - IR for drainage, 60 mL   - Gram stain with GNB and GPC in pairs and chains - culture prelim enterobacter cloacae      Sepsis     - Occurring in setting of biliary obstruction s/p PTC external/internal placement for source control  - BCx 2/12 Enterobacter 72hrs to growth, Zosyn (2/12-2/18) Vanco (2/12-2/14), continue zosyn 2/19 for 14 days total  - Levo off   - Goal SBP >90, MAP >60   - Enterobacter cloacae complex      Acute pulmonary insufficiency     - Intubated 2/12 prior to [facility] placement for increased WOB  - Extubated 2/17 to HFNC, now weaned to [facility]  - Has diuresed well with lasix in the past, no continued need at this time  - IS and Flutter valve    - Chest PT       hx of Pulmonary embolism     - PE 01/18/17 on CT chest   - This hospitalization has been on therapeutic Lovenox, and Heparin gtt  - Currently holding therapeutic anticoagulation in setting of hematoma and ongoing risk for bleeding  - Heparin SQ TID  - Per HBP - will not restart therapeutic anticoagulation in the near future      Acute blood loss anemia     - H/H stable  - Last transfusion 2/14  - Will monitor and transfuse prn        Cancer associated pain     Methadone at home  Pain management guided by patient's primary pain physician Dr Irven [place]  - methadone at 48m IV BID (09:00 & 21:00) and 7.5 mg IV qDay @ 13:00  - 2/15 increase methadone to 7.544mand 1027m- QTc acceptable, monitor for prolongation  - consider PCA when appropriate  - Held methadone  for sedation (2/18), restarted 11m PO q8h 2/19      Depression     -Continue Elavil and Lexapro      Hypothyroidism     -Synthroid          Diagnoses for this hospitalization include:  Acute kidney injury without ATN       ---SICU Critical Care  Bundle discussed with nursing---      Author: NPeyton Najjar NP  as of: 04/25/2017  at: 7:22 AM

## 2017-04-25 NOTE — Progress Notes (Signed)
Report Given To  [facility] RN      Descriptive Sentence / Reason for Admission   PMHx: pancreatic adenocarcinoma    1/25 s/p open Whipple. She is being admitted to SICU for post-operative monitoring, notably hemodynamic status and post-op pain control.      Active Issues / Relevant Events   2/8: upper GI series done  2/12: Continued confusion/pain on CC5, blood/urine culture sent. CT abd done; hematoma near common bile duct found; IR for biliary drain. Intubated for inc RR. Transferred to [address]00. 2L IVF for HoTn.   2/13N: 5.5 L for HoTN and Levo; A-line and CVC placed. DAYS: ECHO (bilateral pleural effusions), IV methadone restarted  2/14N: 2.5L p-lyte, levo off DAYS: PEEP weaned throughout the day, 1 uPRBC for H/H 6.9/21; MIVF d/c'd; Fi02 40% --> 100% d/t acute episode of hypoxia during repositioning in the evening.  2/15N: CXR done. Vent weaned from 100% at start of shift. Mediport de-accessed.  2/15D Methadone dose increased (plan to wean fentanyl). CXR showing worsening L pleural effusion. Lasix gtt initiated. VSS. Blood cultures positive for Klebsiella enterobacter.   2/16N: Fentanyl weaned. 1mEq K x3.   2/16D Tolerated PS for 3 hours on 50% 8/8. Lasix gtt off, diamox started  2/17: Lasix restarted  2/17 D: extubated to HFNC, OOB to chair, increased FiO2 for desat into high 80s, worked with PT, back to bed with Clarise Cruz lift, lethargic this afternoon (very delayed responses, arousable but falls back asleep quickly, needs lots of prompting/multiple requests to follow commands), ABG - pCO2 36, pO2 66. Hold evening dose of methadone.   2/18N: Lethargic; held nightly dose of Methadone  2/18D: Methadone d/c'ed for lethargy. Pulm toilet with bed percussion. CT for possible obstruction.   2/19N: Pain control issues; Dilaudid x4 given. Added tylenol, lidocaine patch and methadone for pain control. Went to IR for aspiration of 60cc of fluid collection, sent for cx.  2/20D: NG pulled, daily enema started. Weaned to 4L  2/21:   Good uop. Remains confused to time. Taking sips H2O w/ meds, swallows fine, no coughing. Slept well  2/21 D: weaned to RA, Aline d/c'd. OOB with sara lift. Cultures from abdominal collection resutled with candida team to consult ID      To Do List   Goal: SBP > 90 or MAP > 60   Flush biliary drain BID w/ 5cc NS (2000 and 0800)    Please remember to complete care plan and education record at 0400 and 1600 hours      Anticipatory Guidance / Discharge Planning  [facility] Prevention: LIJ CVC,  Mediport (de-accessed) , PIV x1, biliary drain, Foley     Daughter updated at bedside 2/21

## 2017-04-25 NOTE — Progress Notes (Signed)
Report Given To  Levada Dy RN      Descriptive Sentence / Reason for Admission   PMHx: pancreatic adenocarcinoma    1/25 s/p open Whipple. She is being admitted to SICU for post-operative monitoring, notably hemodynamic status and post-op pain control.      Active Issues / Relevant Events   2/8: upper GI series done  2/12: Continued confusion/pain on CC5, blood/urine culture sent. CT abd done; hematoma near common bile duct found; IR for biliary drain. Intubated for inc RR. Transferred to 83600. 2L IVF for HoTn.   2/13N: 5.5 L for HoTN and Levo; A-line and CVC placed. DAYS: ECHO (bilateral pleural effusions), IV methadone restarted  2/14N: 2.5L p-lyte, levo off DAYS: PEEP weaned throughout the day, 1 uPRBC for H/H 6.9/21; MIVF d/c'd; Fi02 40% --> 100% d/t acute episode of hypoxia during repositioning in the evening.  2/15N: CXR done. Vent weaned from 100% at start of shift. Mediport de-accessed.  2/15D Methadone dose increased (plan to wean fentanyl). CXR showing worsening L pleural effusion. Lasix gtt initiated. VSS. Blood cultures positive for Klebsiella enterobacter.   2/16N: Fentanyl weaned. 61mEq K x3.   2/16D Tolerated PS for 3 hours on 50% 8/8. Lasix gtt off, diamox started  2/17: Lasix restarted  2/17 D: extubated to HFNC, OOB to chair, increased FiO2 for desat into high 80s, worked with PT, back to bed with Clarise Cruz lift, lethargic this afternoon (very delayed responses, arousable but falls back asleep quickly, needs lots of prompting/multiple requests to follow commands), ABG - pCO2 36, pO2 66. Hold evening dose of methadone.   2/18N: Lethargic; held nightly dose of Methadone  2/18D: Methadone d/c'ed for lethargy. Pulm toilet with bed percussion. CT for possible obstruction.   2/19N: Pain control issues; Dilaudid x4 given. Added tylenol, lidocaine patch and methadone for pain control. Went to IR for aspiration of 60cc of fluid collection, sent for cx.  2/20D: NG pulled, daily enema started. Weaned to 4L  2/21:   Good uop. Remains confused to time. Taking sips H2O w/ meds, swallows fine, no coughing. Slept well  2/21 D: weaned to RA, Aline d/c'd. OOB with sara lift. Cultures from abdominal collection resutled with candida team to consult ID. Progressed to clears       To Do List   Goal: SBP > 90 or MAP > 60   Flush biliary drain BID w/ 5cc NS (2000 and 0800)    Please remember to complete care plan and education record at 0400 and 1600 hours      Anticipatory Guidance / Discharge Planning  [facility] Prevention: LIJ CVC,  Mediport (de-accessed) , PIV x1, biliary drain, Foley     Daughter updated at bedside 2/21

## 2017-04-25 NOTE — Consults (Addendum)
INFECTIOUS DISEASES CONSULTATION      REASON FOR CONSULTATION: C.Glabrata on abdominal abscess - treat or not?    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 Other PMH includes fibromyalgia and hypothyroidism. She is on dexamethasone 43m daily since 09/2016 for appetite support.     The surgery was uncomplicated. Was noted to have hypoxia stable on NC, acute anemia being supported by transfusions. On 04/03/17, noted to have a lot of nausea and emesis. NGT was placed on 1/31 with copious output. Due to persistent n/v, gastric distention, on 04/08/17, CT A/P was done which showed several fluid collections in the surgical bed, could be post-surgical  but difficult to exclude anastomotic leak, and possible cirrhosis. Symptoms persisted, and on 2/8, UGI w/o contrast showed findings of gastric outlet obstruction at the efferent limb. On 2/9, GI was consulted for this and EUS/EGD were planned.  On 04/09/17,  TPN was started. She had been afebrile all this while. Around 2/11-2/12, became hypotensive, tachycardic, tachypneic, more acutely anemic, leukopenic with normal ANC, liver tests went up. 1 set of blood cultures from the right Mediport were sent (no peripheral were sent). CT A/P on 2/12 showed enlarging hematoma in the surgical bed 8 x 3.5 cm which was previously measuring 4.1 x 2.7 cm on 04/08/17, and this was severely compressing the main portal vein and CBD. Hence, on 2/12, underwent placement of an internal/external biliary drain in the right hepatic drain. No cultures were deemed necessary to be sent. During the procedure, the respiratory status worsened more, hence got intubated and on pressors. CXR showed moderate-sized left-sided pleural effusion. Was started on vancomycin 2/12 to 2/14 and Zosyn  2/12 to present. The BCx came positive for Enterobacter cloacae complex in 70 hrs, sensitive to zosyn.     Was extubated on 2/17. The liver tests came down. Leukocytosis has been persisting. Underwent EUS/EGD on 2/18. On 2/18, another CT A/P done to evaluate the drain, and showed the hematoma to be near resolved, but showed moderate amount of ascites with a persistent prominent pocket of fluid in the upper abdominal cavity, measuring 9.8 x 4.3 cm. This collection was previously measuring 9.7 x 2.8 cm on 2/12 CT but was NOT reported in the CT A/P report on 04/16/17. On 2/19 underwent IR-guided drainage of the abscess, and 60cc purulent fluid taken out and sent to micro. GS had >25 PMNs, many GPC in pairs/chains, many GNB and cultures 4+ Enterobacter cloacae complex. The cultures also grew 1+ Candida glabrata today, and ID called for above reason.     On exam, patient appeared tired and uncomfortable. Her daughter, who is a nMarine scientistwas at bedside. Patient reports feeling a lot of abdominal pain, that has been ongoing since the surgery, not getting better or worse. Denied nausea/vomiting presently. Denied f/c/rigors. NGT was taken out today. TPN still on. Also feeling SOB and was weaned to room air today. Very poor appetite, and feels very weak.       ALLERGIES:  Allergy History as of 04/25/17      No Known Allergies (drug, envir, food or latex)                  ANTIBIOTICS THIS HOSPITALIZATION:  On erythromycin from home.     vancomycin 2/12 to 2/14 and  Zosyn 2/12 to present.     OTHER INPATIENT MEDICATIONS:   docusate sodium  100 mg Oral 2 times per day    FLEET ENEMA  1 enema Rectal Daily    acetaminophen  1,000 mg Oral Q8H    methadone  5 mg Oral Q8H    heparin (porcine)  5,000 Units Subcutaneous Q8H    insulin regular  0-20 Units Subcutaneous Q6H    pantoprazole  40 mg Intravenous Q24H    piperacillin-tazobactam  3.375 g Intravenous Q6H    levothyroxine  137 mcg Oral Daily    amitriptyline  25 mg Oral  Nightly    escitalopram  20 mg Oral Nightly     lidocaine, HYDROmorphone PF, heparin lock flush, ondansetron, naloxone      PAST MEDICAL HISTORY:  Past Medical History:   Diagnosis Date    Acute kidney failure 03/31/2017    Cancer     Depression     Fibromyalgia     Hypothyroidism          FAMILY HISTORY:  Family History   Problem Relation Age of Onset    Breast cancer Mother         died 1    Cancer Father         NHL, died 71    Heart Disease Father     Diabetes Sister     Obesity Sister         4 siblings       SOCIAL HISTORY:  Social History     Social History    Marital status: Divorced     Spouse name: N/A    Number of children: N/A    Years of education: N/A     Occupational History    Not on file.     Social History Main Topics    Smoking status: Never Smoker    Smokeless tobacco: Never Used    Alcohol use No    Drug use: No    Sexual activity: Not on file     Social History Narrative    No narrative on file         REVIEW OF SYSTEMS:    A 12-point review of systems was performed and was negative or not relevant except for those details mentioned in the history of present illness.    PHYSICAL EXAM:  BP: (104-120)/(54-65)   Temp:  [35.8 C (96.4 F)-36.9 C (98.4 F)]   Temp src: Temporal (02/21 1200)  Heart Rate:  [80-93]   Resp:  [15-28]   SpO2:  [96 %-99 %]     General: Sitting in chair, tired and uncomfortable appearing, cooperative  Lines: Left IJ CDI, Right chest Mediport de-accessed  HEENT: Atraumatic head, EOMI  Cardiovascular: Regular rate and rhythm, no murmurs  Lungs: Moderately SOB on room air, with >97% saturations, RR in 20s. Bibasilar crackles  Abdomen: Surgical site healing well, Soft, but diffusely tender, Right upper abdomen has drain in with clear bilious fluid, no other drains  GU: +foley  Neurologic: Grossly normal motor and sensory  Extremities: No cyanosis or edema bilaterally    LABS:    Recent Labs  Lab 04/25/17  0117 04/23/17  2343 04/22/17  2354   WBC 16.5*  18.8* 19.1*   Hemoglobin 8.9*   10.6* 8.9*   9.4* 9.3*   9.8*   Hematocrit 27* 27* 28*   Platelets 383* 340 371*   Bands % '4 1 3   ' Seg Neut %  81.5 84.4 74.4         Recent Labs  Lab 04/25/17  0117 04/23/17  2343 04/22/17  2354   Sodium 132* 135 132*   Potassium 3.9 3.7 3.8   CO2 19* 19* 19*   UN 36* 41* 47*   Creatinine 0.77 0.92 0.95   Glucose 114* 125* 134*   Calcium 7.9* 8.1* 7.9*         Recent Labs  Lab 04/25/17  0117 04/23/17  2343 04/22/17  2354   Albumin 2.6* 2.5* 2.4*   Total Protein 6.0* 5.9* 5.6*   ALT 40* 55* 73*   AST '20 25 29   ' Alk Phos 257* 251* 294*     Recent Labs      04/25/17   0117  04/23/17   2343  04/22/17   2354   INR  1.2*  1.2*  1.1           Recent Labs  Lab 04/25/17  0117 04/23/17  2343 04/22/17  2354   Lactate ART,WB 0.9* 1.1* 1.1*        No results for input(s): CRP, ESR in the last 168 hours.      MICROBIOLOGY:  1/25: Intraoperative bile : NTD    2/12: 1 set of blood cultures from the right Mediport were sent (no peripheral were sent): positive for Enterobacter cloacae complex in 70 hrs, sensitive to zosyn.     2/18 abdominal GS had >25 PMNs, many GPC in pairs/chains, many GNB and cultures 4+ Enterobacter cloacae complex. The cultures also grew 1+ Candida glabrata         IMAGING:  Ct Abdomen And Pelvis Without Contrast    Result Date: 04/22/2017  1. Interval placement of an external/internal biliary drainage catheter in appropriate position. No definite biliary ductal dilatation. 2. Status post Whipple. Near complete resolution of previously seen intra-abdominal hematoma in the surgical bed. Moderate amount of ascites with a persistent prominent pocket of fluid in the upper abdominal cavity, measuring 9.8 x 4.3 cm. END OF IMPRESSION I have personally reviewed the images and the Resident's/Fellow's interpretation and agree with or edited the findings. [facility] submits this DICOM format image data and final report to the [facility], an independent secure electronic health  information exchange, on a reciprocally searchable basis (with patient authorization) for a minimum of 12 months after exam date.    Ir Percutaneous Drainage    Result Date: 04/23/2017  Successful CT-guided aspiration of a midline abdominal fluid collection with removal of 60 cc of purulent fluid, complicated as above. 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 Post-operative course complicated with GOO and septicshock due to cholangitis due to hematoma (also caused acute blood loss) in surgical bed that was severely obstructing CBD and portal vein. Underwent relief of obstruction with drain placement in right hepatic duct, and the hematoma is now resolved. Only Mediport cultures obtained on 2/12 (when became septic), and grew Enterobacter cloacae complex. On repeat CT A/P on 04/22/17 found to have a 9.8 x 4.3 cm. This collection was previously measuring 9.7 x 2.8 cm on 2/12 CT but is apparently not reported in the CT A/P report on 04/16/17, and hence likely got missed due to this. On 2/19 underwent IR-guided drainage of the  abscess, and 60cc purulent fluid taken out and sent to micro. GS had >25 PMNs, many GPC in pairs/chains, many GNB and cultures 4+ Enterobacter cloacae complex. The cultures also grew 1+ Candida glabrata today, and ID called for whether the candida needs to be treated.     Impression: Enterobacter cloacae complex bacteremia and abdominal abscess, and Candida glabrata abdominal abscess in the setting of obstructive cholangitis due to the hematoma in a surgical bed in a patient with recent Whipple surgery on 03/10/24, with complicated postop course due to GOO also due to the hematoma and added risk factor of being on TPN. The Candida is considered a true pathogen given the recent surgery, septic  shock, clearly implicating GI source and being on TPN.       RECOMMENDATIONS:  - Start caspofungin   - Continue zosyn  - Will recommend 10 days of course from 04/23/17 (date of abscess drainage). A drain was not left in place and there are high chances this will re-accumulate. Obtain repeat CT A/P at the end of the course to assess size of the abscess.   - Obtain blood cultures from the periphery, left IJ, and the Mediport  - F/u cultures  - Plan verbally communicated to team    ID will follow     Patient seen and examined with ID attending, Dr Morrell Riddle    Rosina Lowenstein, MD  Infectious Diseases fellow, PGY-4   Pager [phone]    ID Attending Addendum: I personally saw examined the patient and discussed the case in detail with Dr. Nancy Nordmann.  The note was reviewed and I agree with the details of the physical exam findings and record of pertinent laboratory and diagnostic data. I concur with the assessment and recommendations which reflect our discussion. Briefly, Ms. Bernabe is a 72 year old female with a history of pancreatic cancer s/p neoadjuvant chemotherapy and radiation who was admitted on 1/25 for elected roux-en-y pancreaticoduodenectomy. Her post-operative course has been complicated by the development of gastric distension and obstruction and a large surgical bed hematoma causing CBD and hepatic vein compression s/p placement of a right hepatic duct external biliary drain. She was also found to have enterobacter cloacae bacteremia on 2/12 blood cultures presumed to be secondary to infection of the hematoma. However on repeat CT abdomen on 2/18 the hematoma had resolved but she was noted to have a more well circumscribed and larger anterior abdominal collection percutaneously aspirated on 2/19 where 60 cc of purulent fluid was removed. Cultures of the fluid have now grown enterobacter cloacae and Candida glabrata.    Exam: Afebrile. Lungs clear. Heart regular without murmurs. Abdomen distended, moderately and diffusely  tender. Extremities edematous.  Assessment/Plan: 79 female with post-operative anterior abdominal abscess infected with enterobacter and candida glabrata. Would continue therapy with zosyn to treat the enterobacter and also other enteric pathogens and start caspofungin to treat the candida glabrata. Even though the majority of infected fluid has likely been aspirated, without appropriate antimicrobial therapy it is likely to re-accumulate. Would therefore continue treatment with the above regimen for 10 days and then repeat CT on 3/2 to assess for resolution of the collection prior to stopping therapy. Finally would repeat blood cultures from all her lines to ensure she has not seeded them with the yeast or enterobacter.    Zenovia Jarred, MD  ID Attending, pager [phone]  April 25, 2017 9:04 PM

## 2017-04-25 NOTE — Progress Notes (Signed)
Report Given To  [name], RN      Descriptive Sentence / Reason for Admission   PMHx: pancreatic adenocarcinoma    1/25 s/p open Whipple. She is being admitted to SICU for post-operative monitoring, notably hemodynamic status and post-op pain control.      Active Issues / Relevant Events   2/8: upper GI series done  2/12: Continued confusion/pain on CC5, blood/urine culture sent. CT abd done; hematoma near common bile duct found; IR for biliary drain. Intubated for inc RR. Transferred to [address]00. 2L IVF for HoTn.   2/13N: 5.5 L for HoTN and Levo; A-line and CVC placed. DAYS: ECHO (bilateral pleural effusions), IV methadone restarted  2/14N: 2.5L p-lyte, levo off DAYS: PEEP weaned throughout the day, 1 uPRBC for H/H 6.9/21; MIVF d/c'd; Fi02 40% --> 100% d/t acute episode of hypoxia during repositioning in the evening.  2/15N: CXR done. Vent weaned from 100% at start of shift. Mediport de-accessed.  2/15D Methadone dose increased (plan to wean fentanyl). CXR showing worsening L pleural effusion. Lasix gtt initiated. VSS. Blood cultures positive for Klebsiella enterobacter.   2/16N: Fentanyl weaned. 64mEq K x3.   2/16D Tolerated PS for 3 hours on 50% 8/8. Lasix gtt off, diamox started  2/17: Lasix restarted  2/17 D: extubated to HFNC, OOB to chair, increased FiO2 for desat into high 80s, worked with PT, back to bed with Clarise Cruz lift, lethargic this afternoon (very delayed responses, arousable but falls back asleep quickly, needs lots of prompting/multiple requests to follow commands), ABG - pCO2 36, pO2 66. Hold evening dose of methadone.   2/18N: Lethargic; held nightly dose of Methadone  2/18D: Methadone d/c'ed for lethargy. Pulm toilet with bed percussion. CT for possible obstruction.   2/19N: Pain control issues; Dilaudid x4 given. Added tylenol, lidocaine patch and methadone for pain control. Went to IR for aspiration of 60cc of fluid collection, sent for cx.  2/20D: OOB to chair with Clarise Cruz lift, NG pulled, daily enema  started. Weaned to 4L  2/21:  Good uop. Remains confused to time. Taking sips H2O w/ meds, swallows fine, no coughing. Slept well      To Do List   Goal: SBP > 90 or MAP > 60   Flush biliary drain BID w/ 5cc NS (2000 and 0800)    Please remember to complete care plan and education record at 0400 and 1600 hours      Anticipatory Guidance / Discharge Planning  [facility] Prevention: LIJ CVC, L brachial a-line, Mediport (de-accessed) , PIV x1, biliary drain, Foley     Daughter updated at bedside 2/20

## 2017-04-25 NOTE — Progress Notes (Signed)
Fluids & Electrolytes: No continuous, replete as needed  Nutrition / last BM: TPN @75mls /hr with Lipids 12hours, NPO with advance diet? Last BM 2/20   Mobility plan: OOB with Clarise Cruz lift, PT following  Neuro / sedation / pain / spontaneous awakening:prn dilaudid, ATC methadone , ATC tylenol, lidocaine patch  Respiratory weaning (and PST, TC) / pulmonary toilet: Pulmonary toilet, NC 2L  Delirium / sleep / prevention / intervention: CAM (-)  List of antimicrobials: Zosyn  Drains / Lines / Tubes: , LIJ CVC, Mediport (de-accessed) , PIV x1, biliary drain, Foley   Indication for foley: I&O  DVT prophylaxis / a PTT: SQ heparin, ICP, APTT 30.7  Indications for gastric acid suppression: Protonix  Labs ordered:  Q6 hours: BG               Daily: BMP, CBC with diff, CMP, Mg, Ph, APTT, PT/INR               Tryglicerides Three times week  Family concern / updates: Family updated 2/20  Patient likes to be called: [name]   Code status: FULL  Disposition / Level: PCU  Nursing reiterate plan for day:  Yes  Author: Mattie Marlin, RN  as of: 04/25/2017 At: 7:29 AM

## 2017-04-26 ENCOUNTER — Inpatient Hospital Stay: Payer: Medicare (Managed Care)

## 2017-04-26 DIAGNOSIS — R918 Other nonspecific abnormal finding of lung field: Secondary | ICD-10-CM

## 2017-04-26 DIAGNOSIS — R1031 Right lower quadrant pain: Secondary | ICD-10-CM

## 2017-04-26 DIAGNOSIS — R630 Anorexia: Secondary | ICD-10-CM

## 2017-04-26 LAB — CBC AND DIFFERENTIAL
Baso # K/uL: 0 10*3/uL (ref 0.0–0.1)
Basophil %: 0 %
Eos # K/uL: 0.2 10*3/uL (ref 0.0–0.4)
Eosinophil %: 1.8 %
Hematocrit: 26 % — ABNORMAL LOW (ref 34–45)
Hemoglobin: 8.3 g/dL — ABNORMAL LOW (ref 11.2–15.7)
Lymph # K/uL: 0.5 10*3/uL — ABNORMAL LOW (ref 1.2–3.7)
Lymphocyte %: 4.4 %
MCH: 29 pg/cell (ref 26–32)
MCHC: 32 g/dL (ref 32–36)
MCV: 90 fL (ref 79–95)
Mono # K/uL: 0.1 10*3/uL — ABNORMAL LOW (ref 0.2–0.9)
Monocyte %: 0.9 %
Neut # K/uL: 12.1 10*3/uL — ABNORMAL HIGH (ref 1.6–6.1)
Nucl RBC # K/uL: 0 10*3/uL (ref 0.0–0.0)
Nucl RBC %: 0 /100 WBC (ref 0.0–0.2)
Platelets: 411 10*3/uL — ABNORMAL HIGH (ref 160–370)
RBC: 2.9 MIL/uL — ABNORMAL LOW (ref 3.9–5.2)
RDW: 18.6 % — ABNORMAL HIGH (ref 11.7–14.4)
Seg Neut %: 90.2 %
WBC: 13.1 10*3/uL — ABNORMAL HIGH (ref 4.0–10.0)

## 2017-04-26 LAB — COMPREHENSIVE METABOLIC PANEL
ALT: 34 U/L (ref 0–35)
AST: 25 U/L (ref 0–35)
Albumin: 2.6 g/dL — ABNORMAL LOW (ref 3.5–5.2)
Alk Phos: 307 U/L — ABNORMAL HIGH (ref 35–105)
Anion Gap: 15 (ref 7–16)
Bilirubin,Total: 0.5 mg/dL (ref 0.0–1.2)
CO2: 19 mmol/L — ABNORMAL LOW (ref 20–28)
Calcium: 7.9 mg/dL — ABNORMAL LOW (ref 8.6–10.2)
Chloride: 100 mmol/L (ref 96–108)
Creatinine: 0.72 mg/dL (ref 0.51–0.95)
GFR,Black: 98 *
GFR,Caucasian: 85 *
Glucose: 112 mg/dL — ABNORMAL HIGH (ref 60–99)
Lab: 33 mg/dL — ABNORMAL HIGH (ref 6–20)
Potassium: 4 mmol/L (ref 3.3–5.1)
Sodium: 134 mmol/L (ref 133–145)
Total Protein: 6.1 g/dL — ABNORMAL LOW (ref 6.3–7.7)

## 2017-04-26 LAB — RBC MORPHOLOGY

## 2017-04-26 LAB — DIFF MANUAL
Bands %: 2 % (ref 0–10)
Diff Based On: 113 CELLS
Metamyelocyte %: 1 % (ref 0–1)

## 2017-04-26 LAB — MAGNESIUM: Magnesium: 2.2 mg/dL (ref 1.6–2.5)

## 2017-04-26 LAB — AEROBIC CULTURE

## 2017-04-26 LAB — POCT GLUCOSE
Glucose POCT: 128 mg/dL — ABNORMAL HIGH (ref 60–99)
Glucose POCT: 133 mg/dL — ABNORMAL HIGH (ref 60–99)
Glucose POCT: 79 mg/dL (ref 60–99)

## 2017-04-26 LAB — APTT: aPTT: 30.8 s (ref 25.8–37.9)

## 2017-04-26 LAB — PROTIME-INR
INR: 1.2 — ABNORMAL HIGH (ref 0.9–1.1)
Protime: 13.1 s — ABNORMAL HIGH (ref 10.0–12.9)

## 2017-04-26 LAB — PHOSPHORUS: Phosphorus: 3 mg/dL (ref 2.7–4.5)

## 2017-04-26 MED ORDER — HYDROMORPHONE HCL 2 MG/ML IJ SOLN *WRAPPED*
1.0000 mg | INTRAMUSCULAR | Status: AC | PRN
Start: 2017-04-26 — End: 2017-04-28
  Administered 2017-04-26 – 2017-04-27 (×3): 1 mg via INTRAVENOUS
  Filled 2017-04-26 (×3): qty 1

## 2017-04-26 MED ORDER — FAT EMULSION SOYBEAN OIL 20 % IV EMUL *WRAPPED*
60.0000 g | Freq: Every day | INTRAVENOUS | Status: AC
Start: 2017-04-26 — End: 2017-04-27
  Administered 2017-04-26 – 2017-04-27 (×7): 60 g via INTRAVENOUS
  Filled 2017-04-26: qty 300

## 2017-04-26 MED ORDER — METOCLOPRAMIDE HCL 5 MG/5ML PO SOLN *I*
5.0000 mg | Freq: Four times a day (QID) | ORAL | Status: DC
Start: 2017-04-26 — End: 2017-04-27
  Administered 2017-04-26 (×2): 5 mg via ORAL
  Filled 2017-04-26: qty 5
  Filled 2017-04-26: qty 6
  Filled 2017-04-26 (×4): qty 5

## 2017-04-26 MED ORDER — POLYETHYLENE GLYCOL 3350 PO PACK 17 GM *I*
17.0000 g | PACK | Freq: Every day | ORAL | Status: DC
Start: 2017-04-26 — End: 2017-04-27
  Administered 2017-04-26 – 2017-04-27 (×2): 17 g via ORAL
  Filled 2017-04-26 (×2): qty 17

## 2017-04-26 MED ORDER — LORAZEPAM 0.5 MG PO TABS *I*
0.5000 mg | ORAL_TABLET | Freq: Two times a day (BID) | ORAL | Status: DC | PRN
Start: 2017-04-26 — End: 2017-04-26
  Administered 2017-04-26: 0.5 mg via ORAL
  Filled 2017-04-26: qty 1

## 2017-04-26 MED ORDER — HYDROMORPHONE HCL 2 MG/ML IJ SOLN *WRAPPED*
0.5000 mg | INTRAMUSCULAR | Status: AC | PRN
Start: 2017-04-26 — End: 2017-04-28
  Administered 2017-04-26 – 2017-04-28 (×10): 0.5 mg via INTRAVENOUS
  Filled 2017-04-26 (×10): qty 1

## 2017-04-26 MED ORDER — LORAZEPAM 0.5 MG PO TABS *I*
0.5000 mg | ORAL_TABLET | Freq: Four times a day (QID) | ORAL | Status: AC | PRN
Start: 2017-04-26 — End: 2017-05-24
  Administered 2017-04-29 – 2017-05-09 (×4): 0.5 mg via ORAL
  Filled 2017-04-26 (×4): qty 1

## 2017-04-26 MED ORDER — PANTOPRAZOLE SODIUM 40 MG PO TBEC *I*
40.0000 mg | DELAYED_RELEASE_TABLET | Freq: Every morning | ORAL | Status: DC
Start: 2017-04-27 — End: 2017-05-16
  Administered 2017-04-27 – 2017-05-16 (×20): 40 mg via ORAL
  Filled 2017-04-26 (×21): qty 1

## 2017-04-26 MED ORDER — STERILE WATER FOR INJECTION (TPN USE ONLY) *I*
INTRAVENOUS | Status: AC
Start: 2017-04-26 — End: 2017-04-27
  Filled 2017-04-26: qty 840

## 2017-04-26 NOTE — Progress Notes (Signed)
Report Given To  Elmyra Ricks, RN      Descriptive Sentence / Reason for Admission   PMHx: pancreatic adenocarcinoma    1/25 s/p open Whipple. She is being admitted to SICU for post-operative monitoring, notably hemodynamic status and post-op pain control.      Active Issues / Relevant Events   2/8: upper GI series done  2/12: Continued confusion/pain on CC5, blood/urine culture sent. CT abd done; hematoma near common bile duct found; IR for biliary drain. Intubated for inc RR. Transferred to [address]00. 2L IVF for HoTn.   2/13N: 5.5 L for HoTN and Levo; A-line and CVC placed. DAYS: ECHO (bilateral pleural effusions), IV methadone restarted  2/14N: 2.5L p-lyte, levo off DAYS: PEEP weaned throughout the day, 1 uPRBC for H/H 6.9/21; MIVF d/c'd; Fi02 40% --> 100% d/t acute episode of hypoxia during repositioning in the evening.  2/15N: CXR done. Vent weaned from 100% at start of shift. Mediport de-accessed.  2/15D Methadone dose increased (plan to wean fentanyl). CXR showing worsening L pleural effusion. Lasix gtt initiated. VSS. Blood cultures positive for Klebsiella enterobacter.   2/16N: Fentanyl weaned. 96mEq K x3.   2/16D Tolerated PS for 3 hours on 50% 8/8. Lasix gtt off, diamox started  2/17: Lasix restarted  2/17 D: extubated to HFNC, OOB to chair, increased FiO2 for desat into high 80s, worked with PT, back to bed with Clarise Cruz lift, lethargic this afternoon (very delayed responses, arousable but falls back asleep quickly, needs lots of prompting/multiple requests to follow commands), ABG - pCO2 36, pO2 66. Hold evening dose of methadone.   2/18N: Lethargic; held nightly dose of Methadone  2/18D: Methadone d/c'ed for lethargy. Pulm toilet with bed percussion. CT for possible obstruction.   2/19N: Pain control issues; Dilaudid x4 given. Added tylenol, lidocaine patch and methadone for pain control. Went to IR for aspiration of 60cc of fluid collection, sent for cx.  2/20D: NG pulled, daily enema started. Weaned to 4L  2/21:   Good uop. Remains confused to time. Taking sips H2O w/ meds, swallows fine, no coughing. Slept well  2/21 D: weaned to RA, Aline d/c'd. OOB with sara lift. Cultures from abdominal collection resutled with candida team to consult ID. Progressed to clears   2/22D: Stand pivot to chair      To Do List   Goal: SBP > 90 or MAP > 60   Flush biliary drain BID w/ 5cc NS (2000 and 0800)    Please remember to complete care plan and education record at 0400 and 1600 hours      Anticipatory Guidance / Discharge Planning  [facility] Prevention: LIJ CVC,  Mediport (de-accessed) , PIV x1, biliary drain, Foley     Daughter updated at bedside 2/21

## 2017-04-26 NOTE — Progress Notes (Signed)
Report Given To  [facility], RN      Descriptive Sentence / Reason for Admission   PMHx: pancreatic adenocarcinoma    1/25 s/p open Whipple. She is being admitted to SICU for post-operative monitoring, notably hemodynamic status and post-op pain control.      Active Issues / Relevant Events   2/8: upper GI series done  2/12: Continued confusion/pain on CC5, blood/urine culture sent. CT abd done; hematoma near common bile duct found; IR for biliary drain. Intubated for inc RR. Transferred to [address]3600. 2L IVF for HoTn.   2/13N: 5.5 L for HoTN and Levo; A-line and CVC placed. DAYS: ECHO (bilateral pleural effusions), IV methadone restarted  2/14N: 2.5L p-lyte, levo off DAYS: PEEP weaned throughout the day, 1 uPRBC for H/H 6.9/21; MIVF d/c'd; Fi02 40% --> 100% d/t acute episode of hypoxia during repositioning in the evening.  2/15N: CXR done. Vent weaned from 100% at start of shift. Mediport de-accessed.  2/15D Methadone dose increased (plan to wean fentanyl). CXR showing worsening L pleural effusion. Lasix gtt initiated. VSS. Blood cultures positive for Klebsiella enterobacter.   2/16N: Fentanyl weaned. 48mEq K x3.   2/16D Tolerated PS for 3 hours on 50% 8/8. Lasix gtt off, diamox started  2/17: Lasix restarted  2/17 D: extubated to HFNC, OOB to chair, increased FiO2 for desat into high 80s, worked with PT, back to bed with Clarise Cruz lift, lethargic this afternoon (very delayed responses, arousable but falls back asleep quickly, needs lots of prompting/multiple requests to follow commands), ABG - pCO2 36, pO2 66. Hold evening dose of methadone.   2/18N: Lethargic; held nightly dose of Methadone  2/18D: Methadone d/c'ed for lethargy. Pulm toilet with bed percussion. CT for possible obstruction.   2/19N: Pain control issues; Dilaudid x4 given. Added tylenol, lidocaine patch and methadone for pain control. Went to IR for aspiration of 60cc of fluid collection, sent for cx.  2/20D: NG pulled, daily enema started. Weaned to 4L  2/21:   Good uop. Remains confused to time. Taking sips H2O w/ meds, swallows fine, no coughing. Slept well  2/21 D: weaned to RA, Aline d/c'd. OOB with sara lift. Cultures from abdominal collection resutled with candida team to consult ID. Progressed to clears       To Do List   Goal: SBP > 90 or MAP > 60   Flush biliary drain BID w/ 5cc NS (2000 and 0800)    Please remember to complete care plan and education record at 0400 and 1600 hours      Anticipatory Guidance / Discharge Planning  [facility] Prevention: LIJ CVC,  Mediport (de-accessed) , PIV x1, biliary drain, Foley     Daughter updated at bedside 2/21

## 2017-04-26 NOTE — Progress Notes (Signed)
Palliative Care Progress Note    HPI:  Amanda Galloway is a 72 year old female with a recent medical history of pancreatic adeocarcinoma. She underwent a whipple procedure on 03/29/17; prior to this, she had received neoadjuvant chemotherapy. She was diagnosed in the summer of 2018, and subsequently was referred to the palliative care clinic in August of 2018, where she follows with Dr. Brion Aliment for pain management.       Notably, at the time of diagnosis, there was no evidence of metastatic disease, and so she was able to receive chemotherapy and undergo surgery with a possibility of cure.     Her pain & symptoms were controlled until course complicated on 1/51/76 by rising LFTs and sepsis with CT concerning for cholangitis given biliary tree obstruction and PV compression due to hematoma and afferent limb distention in setting of PE treatment. Is s/p IR PTC on 04/16/17 and IR percutaneous aspiration of anterior abdominal fluid collection on 04/24/17.      She is on TPN and pending transfer to cancer center today.  Per primary team, she has been withdrawn, very weak and complaining of more pain and anxiety with no oral intake.    Subjective: " I am so tired, they ask me a bunch of questions and I can't answer them"    Palliative Care ROS:  Pain   Moderate  Nausea   Mild  Anorexia   Severe  Anxiety   Moderate  Depression   Mild  Shortness of Breath   None  Tiredness/Fatigue   Severe  Drowsiness/Sleepiness   Mild  Airway Secretions   no  Constipation   no  Delirium   no    Physical Examination:   BP: (108-134)/(55-70)   Temp:  [36 C (96.8 F)-36.5 C (97.7 F)]   Temp src: Temporal (02/22 0800)  Heart Rate:  [81-95]   Resp:  [18-36]   SpO2:  [93 %-99 %]   BP 116/89 (BP Location: Right arm)    Pulse 101    Temp 35.8 C (96.4 F) (Temporal)    Resp (!) 35    Ht 1.702 m ('5\' 7"' )    Wt 73.5 kg (162 lb)    SpO2 98%    BMI 25.37 kg/m   General appearance: alert, mild distress and anxious / slow to respond  Throat: lips, mucosa,  and tongue normal; teeth and gums normal  Lungs: clear to auscultation bilaterally  Heart: regular rate and rhythm  Abdomen: soft, non-tender; bowel sounds normal; no masses,  no organomegaly, RUQ perc drain in place  Extremities: extremities normal, atraumatic, no cyanosis or edema  Neurologic: Grossly normal, intermittently withdrawn, able to identify family and self, knows she is at [employer], says it is night    Assessment/Plan:Amanda Galloway is a 72 year old female with a recent medical history of pancreatic adeocarcinoma. She underwent a whipple procedure on 03/29/17; Has recently had Cholangitis and perc drained place, on TPN with very little oral intake. She denies nausea but declines to eat or drink.    Plan: pain abdomen fluctuates from moderate to severe  Continues on Methadone 5 mg TID oral.- was titrated down due to somnolence,    Restarted on dilaudid IV in past 24 hr, use for moderate pain is Dilaudid 0.5 mg every 1 hr prn IV (x 6 on 04/25/17)  Now dilaudid 1 mg IV added today: For severe pain:   Dilaudid 1 mg every 1 hr prn IV. (x 2 in past 12  hr since added)    Nausea/ loss of appetite: Had been on reglan in past per daughter due to feeling of fullness and loss of appetite.     Has zofran PRN for nausea, patient denies frank nausea but states she is full and no appetite.    Consider adding Reglan 5 mg x 4 doses daily to help stimulate motility and appetite.   Discussed small bites / offer easy to digest food (she was willing to try mandarin oranges but then refused when avail).    Anxiety/ depression/ insomnia:   Dose of ativan during this visit for sx anxiety.  Patient has been on ativan at home PTA.  Recommend going up to Ativan 0.5 mg every 6 hr prn.    Continues on Lexapro 20 mg daily for depression  Amitriptyline 25 mg at HS for sleep.    Patient appears to be "coming out" of PTSD state where she was said by providers looked like catatonia.  She is answering appropriately and able to ask for pain  medication today PRN.     Will monitor for increased alertness and may be able to titrate methadone up if she is maintaining alertness and continues to use multiple doses of dilaudid daily.    Discussed plan / gave support to family and patient at this visit. Patient expressed severe anxiety at getting in/ out of bed and use of anxiety prior to this may be of benefit.    Out team will be available if paged over weekend, or will follow up on Monday 2/25 for sx management.     No goals of care today,as  patient cancer is considered "cured" and continues aggressive therapy to help with infection / healing.    Conferred with primary team    Total Time Spent 35 minutes:   >50% of time was spent in counseling and/or coordination of care.     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    Acute blood loss anemia D62    Hypothyroidism E03.9    Pancreatic cancer C25.9    Anemia D64.9     hx of Pulmonary embolism I26.99    Depression F32.9    Sepsis A41.9       No Known Allergies (drug, envir, food or latex)    Scheduled Meds:    [START ON 04/27/2017] pantoprazole  40 mg Oral QAM    polyethylene glycol  17 g Oral Daily    caspofungin  50 mg Intravenous Q24H    docusate sodium  100 mg Oral 2 times per day    acetaminophen  1,000 mg Oral Q8H    methadone  5 mg Oral Q8H    heparin (porcine)  5,000 Units Subcutaneous Q8H    insulin regular  0-20 Units Subcutaneous Q6H    piperacillin-tazobactam  3.375 g Intravenous Q6H    levothyroxine  137 mcg Oral Daily    amitriptyline  25 mg Oral Nightly    escitalopram  20 mg Oral Nightly       Continuous Infusions:    TPN (2 in 1) Adult Continuous      And    fat emulsion soybean oil      TPN (2 in  1)  Adult Continuous 75 mL/hr at 04/26/17 1059       PRN Meds:  HYDROmorphone PF **OR** HYDROmorphone PF, LORazepam, lidocaine, heparin lock flush, ondansetron, naloxone

## 2017-04-26 NOTE — Consults (Signed)
Medical Nutrition Therapy - Follow Up, TPN check    Admit Date: 03/29/2017     Patient Summary: [name] with PMH pulmonary embolism and as noted below, dx pancreatic adenocarcinoma s/p FOLFIRINOX chemo with neoadjuvant SBRT (see Oncology notes for full hx), admitted for Whipple completed 03/29/17; post-op course c/b concern for GOO distal to the gastro-jejunostomy site. CT showing hematoma with main portal vein and common bile duct compression. TPN started 04/09/17. Pt transferred to SICU 04/16/17 for severe biliary sepsis, septic shock, shock liver, acute respiratory failure, acute blood loss anemia, pain r/t cancer, and hypotension. S/p IR percutaneous bilary drain placed 04/16/17.  Pt extubated 2/17, currently weaned to [facility].  S/P percutaneous aspiration of abdominal fluid collection on 2/19, gram stain positive for GPC and GNB, on antimicrobial tx per ID recs. NG pulled 2/20, daily enemas started.  Started on clears.     Past Medical History:   Diagnosis Date    Acute kidney failure 03/31/2017    Cancer     Depression     Fibromyalgia     Hypothyroidism      Past Surgical History:   Procedure Laterality Date    CHOLECYSTECTOMY      CHOLECYSTECTOMY, LAPAROSCOPIC  09/06/2016    HYSTERECTOMY  08/1986    Fibroids    KNEE SURGERY Right     PR INSERT TUNNELED CV CATH WITH PORT Right 10/04/2016    Procedure: Right IJ MEDIPORT Insertion;  Surgeon: Delano Metz, MD;  Location: [facility] MAIN OR;  Service: Oncology General    PR LAP,DIAGNOSTIC ABDOMEN N/A 10/04/2016    Procedure: LAPAROSCOPY DIAGNOSTIC;  Surgeon: Delano Metz, MD;  Location: [facility] MAIN OR;  Service: Oncology General    PR PART [place] PANC,PROX+REMV DUOD+ANAST N/A 03/29/2017    Procedure: WHIPPLE PROCEDURE;  Surgeon: Delano Metz, MD;  Location: [facility] MAIN OR;  Service: Oncology General    ROTATOR CUFF REPAIR Right     TONSILLECTOMY AND ADENOIDECTOMY       Pertinent Social Hx:  Divorced, resides in [place], [place].  Supportive son and daughter.     Pertinent Meds: reviewed,  of note:    Scheduled Meds:   caspofungin  50 mg Intravenous Q24H    docusate sodium  100 mg Oral 2 times per day    FLEET ENEMA  1 enema Rectal Daily    acetaminophen  1,000 mg Oral Q8H    methadone  5 mg Oral Q8H    heparin (porcine)  5,000 Units Subcutaneous Q8H    insulin regular  0-20 Units Subcutaneous Q6H    pantoprazole  40 mg Intravenous Q24H    piperacillin-tazobactam  3.375 g Intravenous Q6H    levothyroxine  137 mcg Oral Daily    amitriptyline  25 mg Oral Nightly    escitalopram  20 mg Oral Nightly   Continuous Infusions:   TPN (2 in 1) Adult Continuous      And    fat emulsion soybean oil      TPN (2 in 1) Adult Continuous 75 mL/hr at 04/26/17 0559   PRN Meds:.HYDROmorphone PF **OR** HYDROmorphone PF, lidocaine, heparin lock flush, ondansetron, naloxone    Pertinent Labs: reviewed    Na+ 134 WNL   CO2 low 19   UN high 33, improved, other renal fx labs WNL   BGs high 112-128, insulin coverage and protocol in place   Estimated corrected calcium is 9.02, WNL   t bili WNL; LFTs WNL except for alk phos, high, 307  TG high 186 on 2/21   Anemic, MCV WNL; anemia panel not checked.  TPN provides B14 and folic acid but not iron.  Pt receiving intermittent transfusion support.  RBCs last given on 2/14 per I&Os.    Vit D level not checked.  TPN does not provide full DRI of vit D.   carnitine came back low, drawn 2/15    Vitals: Blood pressure 114/59, pulse 87, temperature 36 C (96.8 F), temperature source Temporal, resp. rate 21, height 1.702 m (_0 ), weight 73.5 kg (162 lb), SpO2 99 %.    Reviewed I/O's 2/21   200 ml PO fluid intake    1.6 L UOP    670 ml biliary drain output   BM x 3    I/O last 3 days:  02/19 1500 - 02/22 1459  In: 7345.8 (100 mL/kg) [P.O.:400; Other:25; NG/GT:330; IV Piggyback:945]  Out: 7829 (103.1 mL/kg) [Urine:5549; Emesis/NG output:2030]  Net: -233.2  Weight: 73.5 kg     Access:  PIV, triple lumen PCVC, single port IVAD    Nutrition Hx:    Pt reported she  was primarily only eating cottage cheese with mandarin oranges prior to admission. She would have have this 4x daily and drink Ensure (believes it was Ensure plus) ~TID. She stated she didn't eat much else for weeks. Reports that eating was "awful" after the surgery and had only been able to eat very small amounts.   NPO since 1/31-2/19; TPN started 04/09/17 (10 days after admission)    2/19: NPO-ADAT   2/21: pt taking sips H2O with meds without issues per RN notes; advanced to clears at 17:00    Food allergies: NKFA    Current diet: clear liquids  Supplements: none    Nutrition Focused Physical Exam:  Edema: 1+ general, BUE, BLE per shift assessment   Abdomen: constipation, + BS per shift assessment  Skin: bruising, erythema, abdominal surgical wound per shift assessment    Anthropometrics:  Height: 170.2 cm (_1 )    Weight: 73.5 kg (162 lb)  Ideal Body Weight: 72.4 kg + 10%  Weight is 101.5 % of IBW  BMI (Calculated): 25.4 kg/(m^2) healthy range for age  Weight Hx: stable x 5 months PTA; edema may be masking acute weight loss   04/17/2017 73.483 kg --with 1+ general, BUE, BLE edema    04/09/2017 73.483 kg Actual   03/29/2017 72 kg Actual admit weight with 1+ general, BLE edema    03/22/2017 71.215 kg --   03/11/2017 73 kg  --No edema per Surgical Oncology clinic note    03/06/2017 72.349 kg --   03/01/2017 73.755 kg --   02/19/2017 72.576 kg Actual   01/29/2017 73.573 kg Actual   01/28/2017 73 kg Actual   12/14/2016 73.256 kg --   11/16/2016 69.899 kg --   10/30/2016 67.586 kg Actual   10/05/2016 71.3 kg Actual     Estimated Nutrient Needs: (Based on admit weight 72 kg)    1800-2160 kcal/day (25-30 kcal/kg)   110-145 g protein/day (1.5-2.0 g/kg)    1800-2160 mL fluid/day (25-30 mL/kg) or per team recs     Nutrition Assessment and Diagnosis:   TPN is ordered for tonight as follows:  2-in-1 TPN with lipids via adult central line; amino acid-dextrose solution @ 75 ml/hr x 24 hrs (1800 ml fluid per day), with:   Amino acids 70  g/L  Dextrose 150 g/L   Na+ 110 meq/L   K+ 30 meq/L  Ca++  10 meq/L  Mg 5 meq/L  P 12 mmole/L  Standard MVI  Standard trace elements  200 mg levocarnitine   Chloride: Acetate ratio- 1:2 (one third chloride, two thirds acetate)  AND: 60 g/day lipids (300 ml/day 20% soy lipid emulsion, given over 12 hours)  TPN with lipids provides a total of 2100 ml fluid, 2022 kcal, 126 g protein and 270 g dextrose per day.     Pt with 3 BMs, on an enema regimen.  Taking small amounts of sips.  ID was consulted for abdominal abscess; pt is on antimicrobials per their recs, but per their note there is a high risk of recurrent fluid collection.          Malnutrition Status: Pt with evidence of moderate malnutrition per nutrition note of 04/09/17; pt has received PN support since 04/09/17.      Nutrition Intervention:   1. TPN Initiation  The Clinical Nutrition Specialist agrees TPN is appropriate for this patient. Most recently reviewed at [place] on 04/17/17.  Original Start Date: 2/5  D/C Plan:  TPN will be discontinued once GOO resolves     2.  For Saturday 04/26/17: recommend 2-in-1 TPN with lipids, via adult central line; amino acid dextrose solution @ 75 ml/hr x 24 hrs (1800 ml fluid per day), with:   Amino acids 70 g/L  Dextrose 150 g/L   Na+ 110 meq/L monitor and reduce as needed as GI fx normalizes   K+ 30 meq/L  Ca++ 10 meq/L monitor and reduce as needed as GI fx normalizes  Mg 5 meq/L  Phos 12 mmol/L   Standard MVI  Standard trace elements  ADD: 200 mg levocarnitine  Chloride: Acetate ratio- 1/3 chloride  AND: 60 g/day lipids (300 ml/day 20% soy lipid emulsion, given over 12 hours)  TPN with lipids will provide a total of 2100 ml fluid, 2022 kcal, 126 g protein and 270 g dextrose/day.    Over the weekend- the covering ICU RD can be contacted via the page office (day shifts) if there are any concerns regarding TPN.     3:  PO- recommend adding Ensure Clear TID. Please log % of meals/supplements consumed in RN flowsheet;  appreciate RN assistance.  Will likely add Prosource early next week, depending on PO tolerance.    -consult as needed for TF recs and/or recs to wean TPN    4.  Please continue parenteral monitoring panel. Recommended Monitoring Parameters:               PO4, Mg, and basic metabolic panel daily for 3 days or until stable, then 3 times per week. Replete lytes via IV as needed.                Comprehensive metabolic panel and triglycerides weekly. Hold lipids if TG>400.                Check BGs q 6 hours until stable on goal dextrose.                Monitor weight daily until fluid balance is stable, then weekly.               Accurate I&Os.      5.  Consider checking vit D3 level.  TPN does not provide full DRI of vit D.  Renal/liver injuries increase risk of deficiency.      Nutrition Monitoring/Evaluation:   1. Will monitor TPN tolerance, nutrition-related labs, weight trend, BM pattern.  2. Nutrition to follow up per high nutrition risk protocol.    Serita Butcher, RD, [place] pager# [phone]

## 2017-04-26 NOTE — Progress Notes (Signed)
Report Given To  Linna Hoff, RN on 814      Descriptive Sentence / Reason for Admission   PMHx: pancreatic adenocarcinoma    1/25 s/p open Whipple. She is being admitted to SICU for post-operative monitoring, notably hemodynamic status and post-op pain control.      Active Issues / Relevant Events   2/8: upper GI series done  2/12: Continued confusion/pain on CC5, blood/urine culture sent. CT abd done; hematoma near common bile duct found; IR for biliary drain. Intubated for inc RR. Transferred to [id number]. 2L IVF for HoTn.   2/13N: 5.5 L for HoTN and Levo; A-line and CVC placed. DAYS: ECHO (bilateral pleural effusions), IV methadone restarted  2/14N: 2.5L p-lyte, levo off DAYS: PEEP weaned throughout the day, 1 uPRBC for H/H 6.9/21; MIVF d/c'd; Fi02 40% --> 100% d/t acute episode of hypoxia during repositioning in the evening.  2/15N: CXR done. Vent weaned from 100% at start of shift. Mediport de-accessed.  2/15D Methadone dose increased (plan to wean fentanyl). CXR showing worsening L pleural effusion. Lasix gtt initiated. VSS. Blood cultures positive for Klebsiella enterobacter.   2/16N: Fentanyl weaned. 37mEq K x3.   2/16D Tolerated PS for 3 hours on 50% 8/8. Lasix gtt off, diamox started  2/17: Lasix restarted  2/17 D: extubated to HFNC, OOB to chair, increased FiO2 for desat into high 80s, worked with PT, back to bed with Clarise Cruz lift, lethargic this afternoon (very delayed responses, arousable but falls back asleep quickly, needs lots of prompting/multiple requests to follow commands), ABG - pCO2 36, pO2 66. Hold evening dose of methadone.   2/18N: Lethargic; held nightly dose of Methadone  2/18D: Methadone d/c'ed for lethargy. Pulm toilet with bed percussion. CT for possible obstruction.   2/19N: Pain control issues; Dilaudid x4 given. Added tylenol, lidocaine patch and methadone for pain control. Went to IR for aspiration of 60cc of fluid collection, sent for cx.  2/20D: NG pulled, daily enema started. Weaned to  4L  2/21:  Good uop. Remains confused to time. Taking sips H2O w/ meds, swallows fine, no coughing. Slept well  2/21 D: weaned to RA, Aline d/c'd. OOB with sara lift. Cultures from abdominal collection resutled with candida team to consult ID. Progressed to clears   2/22D: Stand pivot to chair. Ativan added with (+) effect.      To Do List   Goal: SBP > 90 or MAP > 60   Flush biliary drain BID w/ 5cc NS (2000 and 0800)   Foley out at 1400 (need to void by 2200)    Please remember to complete care plan and education record at 0400 and 1600 hours      Anticipatory Guidance / Discharge Planning  [facility] Prevention: [facility] CVC,  Mediport (de-accessed) , PIV x1, biliary drain    Daughter updated at bedside 2/22

## 2017-04-26 NOTE — Progress Notes (Signed)
SICU attending addendum:  Pt seen, examined, and evaluated on multidisciplinary rounds with SICU team.     Amanda Galloway is a 29 y F with [place] significant to fibromyalgia and pancreatic cancer on methadone for pain management who is now s/p whipple 03/29/17. Her intraoperative and immediate post-operative course were uncomplicated with a brief SICU admission postop for pain management, discharged to floor 1/28. 1/31 her NGT was replaced for nausea and vomiting with concern for gastric outlet obstruction. 2/12 pt developed mental status changes, tachypnea and worsening transaminitis. CT abdomen showed a hematoma compressing portal vein and CBD. She was taken to IR for percutaneous biliary drain. She required intubation for respiratory failure prior to the procedure and was readmitted to SICU postop for management. She developed septic shock requiring fluid resuscitation and pressors. Her sepsis improved and she was weaned from pressors and extubated to HFNC 2/17 am. Post extubation she had increased somnolence and her methadone was held for 24 h, now restarted at a reduced dose. 2/19 pm she underwent IR guided drainage of a persistent anterior abdominal fluid collection, with 60cc of purulent drainage sent for culture.     Stable pain control overnight, weaned to oxygen. Tolerating sips of clears. Abdominal fluid collection growing C. Glabrata. ID consulted, started on caspofungin.     Physical Exam by Systems:  Gen: NAD in chair  HEENT: NCAT, PeRRLA  Cardiac: RRR, no MRG  Pulm: CTAB, nl WoB RA  Abdom: S, NT +BS  Extr: WWP, minimal edema  Neuro: A&Ox3, grossly intact    The Problem List was discussed and all plans reviewed by me. I agree with the findings and plan above.   - discuss advancing diet, stopping TPN, discontinuing central line.   - Q2h VS, ok for TTF with surgical oncology  - zosyn and caspofungin antimicrobials, follow cultures  - PT, OOB, ambulate    This patient is critically ill with at least 1 organ  system failure associated with a high probability of imminent or life threatening deterioration. the care I have delivered involved high complexity decision making to assess, manipulate, and support vital system function(s), to treat the vital organ system failure and/or prevent further life threatening deterioration of the patient's condition. All nursing documentation, laboratory data, test results, and radiographs were reviewed and interpreted by me. I have established the management plan for this patient's critical illness and have been immediately available to assist with patient care. my documented total critical care time reflects my own patient care time and does not include teaching or procedure time.   critical care time: I spent 20 minutes personally attending to this patient's critical care needs. This critical care time is exclusive of any time for separately billable procedures.      Signed: Darin Engels MD, 04/26/17 _0       SICU Progress Note    Amanda Galloway is a 35 y F with [place] significant to fibromyalgia and pancreatic cancer on methadone for pain management who is now s/p whipple 03/29/17. Her intraoperative and immediate post-operative course were uncomplicated with a brief SICU admission postop for pain management, discharged to floor 1/28. 1/31 her NGT was replaced for nausea and vomiting with concern for gastric outlet obstruction. 2/12 pt developed mental status changes, tachypnea and worsening transaminitis. CT abdomen showed a hematoma compressing portal vein and CBD. She was taken to IR for percutaneous biliary drain. She required intubation for respiratory failure prior to the procedure and was readmitted to SICU postop for management. She  developed septic shock requiring fluid resuscitation and pressors. Her sepsis improved and she was weaned from pressors and extubated to HFNC 2/17 am, she has since been weaned off supplemental oxygen. She had IR drainage of a fluid collection on 2/19  with enterobacter and candida glabrata. ID was consulted 2/21 and she was started on caspo in addition to zosyn.     Interval History: ID consult yesterday, started caspo for candida glabrata in IR drainage.      Past Med/Sx History:   Past Medical History:   Diagnosis Date    Acute kidney failure 03/31/2017    Cancer     Depression     Fibromyalgia     Hypothyroidism        Physical Exam by Systems:  Please see attending exam.    Assessment and Plan for Active/Followed Hospital Problems:   Active Hospital Problems    Diagnosis    *!*Pancreatic adenocarcinoma s/p Whipple 03/29/17     -Whipple on 1/25 with Dr. Ron Agee  [facility] course c/b gastric outlet obstruction d/t severe compression of the main portal vein and common bile duct by hematoma, PTC drain 2/12 in IR  - Zosyn 2/12 - 2/18  - stop Decadron  -Continue TPN with SSI for glucose control  2/19- Zosyn course extended to 14 days, methadone d/c'd   - CT 2/18 with persistent fluid collection   - IR for drainage, 60 mL 2/19   - Gram stain with GNB and GPC in pairs and chains - culture prelim enterobacter cloacae and gandida glabrata  - Added caspo 2/21      Sepsis     - Occurring in setting of biliary obstruction s/p PTC external/internal placement for source control  - BCx 2/12 Enterobacter 72hrs to growth, Zosyn (2/12-2/18) Vanco (2/12-2/14), continue zosyn 2/19 for 14 days total  - Levo off   - Goal SBP >90, MAP >60   - Enterobacter cloacae complex  - Candida Glabrata       hx of Pulmonary embolism     - PE 01/18/17 on CT chest   - This hospitalization has been on therapeutic Lovenox, and Heparin gtt  - Currently holding therapeutic anticoagulation in setting of hematoma and ongoing risk for bleeding  - Heparin SQ TID  - Per HBP - will not restart therapeutic anticoagulation in the near future      Acute blood loss anemia     - H/H stable  - Last transfusion 2/14  - Will monitor and transfuse prn        Cancer associated pain     Methadone at home  Pain  management guided by patient's primary pain physician Dr Irven [place]  - methadone at 7m IV BID (09:00 & 21:00) and 7.5 mg IV qDay @ 13:00  - 2/15 increase methadone to 7.512mand 1043m- QTc acceptable, monitor for prolongation  - consider PCA when appropriate  - Held methadone for sedation (2/18), restarted 5mg69m q8h 2/19      Depression     -Continue Elavil and Lexapro      Hypothyroidism     -Synthroid          Diagnoses for this hospitalization include:  Acute kidney injury without ATN       ---SICU Critical Care  Bundle discussed with nursing---      Author: NathPeyton Najjar  as of: 04/26/2017  at: 7:58 AM

## 2017-04-26 NOTE — Progress Notes (Signed)
Physical Therapy Treatment Note      Mobility Recommendation Mobility Recommendations: (P) Trial standing with RW and gait belt but then utilize Saralift vs hoyer if pt unable to complete.        04/26/17 1030   PT Tracking   PT TRACKING PT Assigned   Visit Number   Visit Number [facility]) / Treatment Day (HH) 3   Precautions/Observations   Precautions used Yes   LDA Observation Monitors;[employer];Foley cath;Drain   Fall Precautions General falls precautions   Pain Assessment   *Is the patient currently in pain? Yes   Pain (Before,During, After) Therapy During   0-10 Scale (Pt did not rate but yelled out in pain intermittently)   Pain Location Generalized;Back   Pain Descriptors Patient unable to describe   Pain Intervention(s) Refer to nursing for pain management;Repositioned   Cognition   Arousal/Alertness Delayed responses to stimuli   Orientation Oriented to person;Oriented to place   Following Commands Follows simple commands with increased time;Follows simple commands with repetition  (with encouragement)   Bed Mobility   Bed mobility Tested   Supine to Sit 1 person assist;Moderate assist ;Side rails up (#);Head of bed elevated   Sit to Supine Not tested  (Pt in recliner chair at end of session.)   Additional comments Pt able to move LEs in bed more so than previous sessions however required assist to initiate and complete functional movement for supine to sitting transition.   Transfers   Transfers Tested   Sit to Stand 1 person assist;Moderate    Stand to sit 1 person assist;Minimum    Transfer Assistive Device rolling walker  (gait belt)   Additional comments Performed sit to stand from EOB x4 reps total today - x2 reps with myself seated in front of her and assisting at hips then x2 reps with RW.   Mobility   Mobility Tested   Gait Pattern Decreased cadence;Decreased R step length;Decreased R step height;Decreased L step length;Decreased L step height   Ambulation Assist 1 person assist;Minimal   Ambulation  Distance (Feet) 5 ft   Ambulation Assistive Device rolling walker;gait belt   Additional comments Pt able to march in place x3 with each foot upon standing the first time with the RW then was able to progress to forward/side steps from bed to chair. She fatigued quickly but all VSS throughout. Overall significant progress made today compared to recent PT sessions.   Balance   Balance Tested   Sitting - Static Standby assist   Sitting - Dynamic Contact guard   Standing - Static Min assist;Supported   Standing - Dynamic Min assist;Mod assist;Supported   PT AM-PAC Mobility   Turning over in bed? 1   Sitting down on and standing up from a chair with arms? 1   Moving from lying on back to sitting on the side of the bed? 1   Moving to and from a bed to a chair? 3   Need to walk in hospital room? 3   Climbing 3 - 5 steps with a railing? 1   Total Raw Score 10   Standardized Score 32.29   CMS 1-100% Score 77   Medicare Functional Limit Modifiers CL  60 - < 80% impaired, limited, restricted   Assessment   Brief Assessment Remains appropriate for skilled therapy   Problem List Impaired functional mobility   Plan/Recommendation   Treatment Interventions Restorative PT   PT Frequency 2-4x/wk   Mobility Recommendations Trial standing with RW  and gait belt but then utilize Saralift vs hoyer if pt unable to complete.   Discharge Recommendations Skilled Nursing Facility Rehab   Assessment/Recommendations Reviewed With: Patient;Nursing;Family  (updated pt's daughter after session)   Next PT Visit Progressive mobility - continue Saralift but trial standing with RW as participation allows. Progress mobility with RW and gait belt as tolerated.   Time Calculation   PT Timed Codes 25   PT Untimed Codes 0   PT Unbilled Time 6   PT Total Treatment 25   Plan and Onset date   Plan of Care Date 04/21/17   Treatment Start Date 04/21/17   PT Charges   $PT [facility] Charges Functional Training - code 0239 (63min);Gait Training - code 0208 (39min)            Please page with any questions or concerns.    Robby Sermon, PT, DPT  Pager [phone]

## 2017-04-26 NOTE — Plan of Care (Signed)
Bowel Elimination     Elimination patterns are normal or improving Progressing towards goal        Cognitive function     Cognitive function will be maintained Progressing towards goal        Impaired Gas Exchange     Patient will maintain adequate oxygenation Progressing towards goal        Mobility     Functional status is maintained or improved - Geriatric Progressing towards goal        Nutrition     Patient's nutritional status is maintained or improved Progressing towards goal        Pain/Comfort     Patient's pain or discomfort is manageable Progressing towards goal        Post-Operative Bowel Elimination     Elimination pattern is normal or improving Progressing towards goal        Post-Operative Complications     Patient will remain free from symptoms of infection-post op Progressing towards goal        Post-Operative Hemodynamic Stability     Maintain Hemodynamic Stability Progressing towards goal        Safety     Patient will remain free of falls Progressing towards goal

## 2017-04-26 NOTE — Progress Notes (Signed)
Report Given To  Elby Showers., RN      Descriptive Sentence / Reason for Admission   [place] significant to fibromyalgia and pancreatic cancer on methadone for pain management who is now s/p whipple 03/29/17. Her intraoperative and immediate post-operative course were uncomplicated with a brief SICU admission postop for pain management, discharged to floor 1/28. 1/31 her NGT was replaced for nausea and vomiting with concern for gastric outlet obstruction. 2/12 pt developed mental status changes, tachypnea and worsening transaminitis. CT abdomen showed a hematoma compressing portal vein and CBD. She was taken to IR for percutaneous biliary drain. She required intubation for respiratory failure prior to the procedure and was readmitted to SICU postop for management. She developed septic shock requiring fluid resuscitation and pressors. Her sepsis improved and she was weaned from pressors and extubated to HFNC 2/17 am. Post extubation she had increased somnolence and her methadone was held for 24 h, now restarted at a reduced dose. 2/19 pm she underwent IR guided drainage of a persistent anterior abdominal fluid collection, with 60cc of purulent drainage sent for culture.     She is being admitted to SICU for post-operative monitoring, notably hemodynamic status and post-op pain control.      Active Issues / Relevant Events   Admitted patient from 83600 at 1700  Patient settled, four-eyed skin assessment completed by Steffanie Rainwater. And witer. No areas of concern.  Patient lethargic but gradually arousable.   VSSA, no complaints of pain  Post-foley void x2  For full assessments, vitals and I&Os see doc flowsheets      To Do List   Goal: SBP > 90 or MAP > 60   Flush biliary drain BID w/ 5cc NS (2000 and 0800)      Anticipatory Guidance / Discharge Planning  ---Critical Care Bundle---    Fluids & Electrolytes: none  Nutrition/last BM: TPN/Lipids, regular diet (not eating much PO)  Mobility Plan/Ability: sara lift to chair  Respiratory  weaning (PST. TC)/pulmonary toilet: RA  Neuro/sedation/pain/sedation vacation: PRN dilaudid, lidocane patch; ATC tylenol, methadone, PRN ativan  Delirium: Yes/No (based on CAM ICU)  Sleep Hygiene: Yes / No if appropriate  List of antimicrobials: zosyn  Drains/Lines/Tubes: 2 PIV; a-line  Foley/Indication: yes  DVT prophylaxis/aPTT: sq heparin, IPCs  Indication for gastric acid suppression: protonix  Type and frequency of Labs: Daily: CBC&D, BMP, Mag, Phos; CMP, APTT, PTINR  Family Concerns and Updates: Daughter at bedside  Patient's preferred name (AKA): Amanda Galloway  Code Status: Full  Disposition/Level: PCU  Reiterate Plan for the day.    Tonia Brooms, RN

## 2017-04-26 NOTE — Progress Notes (Signed)
Surgical Oncology/Hepatobiliary Surgery Progress Note     LOS: 28 days     Subjective:    No acute events overnight.  Is passing flatus, having bowel movements, and tolerating clear liquids.  Is on room air.      Objective:    Vitals Sign Ranges for Past 24 Hours:  BP: (104-134)/(55-70)   Temp:  [35.8 C (96.4 F)-36.5 C (97.7 F)]   Temp src: Temporal (02/22 0400)  Heart Rate:  [81-92]   Resp:  [18-35]   SpO2:  [93 %-99 %]       Physical Exam:    General Appearance: in NAD, appears catatonic  Cardiac: regular rate  Respiratory: non-labored breathing  Abdomen: soft, mildly tender, pre-existing ecchymosis, biliary tube with bilious output  Extremities: warm    Labs:    CBC:    Recent Labs  Lab 04/25/17  2338 04/25/17  0117 04/23/17  2343 04/22/17  2354  04/21/17  2348  04/21/17  0005   WBC 13.1* 16.5* 18.8* 19.1*  --  15.5*  --  15.5*   Hemoglobin 8.3* 8.9*   10.6* 8.9*   9.4* 9.3*   9.8*  < > 8.7*  < > 8.1*   8.3*   Hematocrit 26* 27* 27* 28*  --  26*  --  24*   Platelets 411* 383* 340 371*  --  353  --  309   < > = values in this interval not displayed.    Metabolic Panel:    Recent Labs  Lab 04/25/17  2338 04/25/17  0117 04/23/17  2343 04/22/17  2354 04/21/17  2348 04/21/17  1201 04/21/17  0005   Sodium 134 132* 135 132* 137 137 137   Potassium 4.0 3.9 3.7 3.8 3.7 3.7 4.0   Chloride 100 99 101 98 97 96 97   CO2 19* 19* 19* 19* _0 UN 33* 36* 41* 47* 52* 45* 39*   Creatinine 0.72 0.77 0.92 0.95 1.11* 1.10* 1.00*   Glucose 112* 114* 125* 134* 155* 143* 165*   Calcium 7.9* 7.9* 8.1* 7.9* 8.4* 8.8 8.6   Magnesium 2.2 2.2 2.2 2.1 2.0  --  1.8   Phosphorus 3.0 2.8 3.7 3.6 5.5*  --  4.5          Assessment:    72 y.o. female with h/o recent PE and pancreatic cancer POD # 28  status post whipple procedure complicated by delayed gastric emptying.  NGT replaced on 04/04/17. UGI on 04/12/17 concerning for obstruction just beyond the [place] in the efferent limb.  Course complicated on 4/74/25 by rising LFTs and sepsis with  CT concerning for cholangitis given biliary tree obstruction and PV compression due to hematoma and afferent limb distention in setting of PE treatment. Is s/p IR PTC on 04/16/17 and IR percutaneous aspiration of anterior abdominal fluid collection on 04/24/17.      Plan:    - Can advance to regular diet as tolerated  - Continue TPN  - IV caspofungin/Zosyn x 10 days with repeat CT abdomen/pelvis  - F/u repeat blood cultures  - Appreciate ID recommendations  - SCDs, heparin SC (hold therapeutic anticoagulation) for VTE ppx  - Appreciate SICU care      [name], MD  04/26/2017     9:45 AM  General Surgery Resident

## 2017-04-26 NOTE — Progress Notes (Signed)
---  Critical Care Bundle--    Fluids & Electrolytes: None    Nutrition / last BM: TPN, clear liquid diet, Last BM 2/21    Mobility plan: Activity as tolerated    Neuro / sedation / pain / sedation holiday:  Methadone 5mg  Q8h, Diluadid 0.5/1 mg PRN    Respiratory weaning (and PST, TC) / pulmonary toilet: Room air, CPT, IS/FV    Delirium / sleep / prevention / intervention: CAM-ICU: Positive    List of antimicrobials: Caspo, Zosyn    Drains / Lines / Tubes: TLIC CVC, Mediport, PIV, Biliary drain, foley    Indication for foley: Accurate I/O critically ill    DVT prophylaxis / a PTT: Heparin SQ, aPTT 30.8    Indications for gastric acid suppression: Protonix    Labs ordered: Daily: BMP,  CBC with diff, CMP, Mag, Phos, aPTT, PT-INR, Tue/Thus/Sat: Triglycerides    Family concern / updates: Updated at bedside 2/21, no concerns    Patient likes to be called: [name]    Code status: FULL    Disposition / Level: SICU, Level 3    Nursing reiterate plan for day: Y    Author: Birdie Hopes, RN  as of: 04/26/2017  at: 7:27 AM

## 2017-04-27 ENCOUNTER — Inpatient Hospital Stay: Payer: Medicare (Managed Care)

## 2017-04-27 DIAGNOSIS — K598 Other specified functional intestinal disorders: Secondary | ICD-10-CM

## 2017-04-27 LAB — CBC AND DIFFERENTIAL
Baso # K/uL: 0.1 10*3/uL (ref 0.0–0.1)
Baso # K/uL: 0.1 10*3/uL (ref 0.0–0.1)
Basophil %: 0.4 %
Basophil %: 0.5 %
Eos # K/uL: 0.1 10*3/uL (ref 0.0–0.4)
Eos # K/uL: 0.2 10*3/uL (ref 0.0–0.4)
Eosinophil %: 1.1 %
Eosinophil %: 1.5 %
Hematocrit: 25 % — ABNORMAL LOW (ref 34–45)
Hematocrit: 26 % — ABNORMAL LOW (ref 34–45)
Hemoglobin: 8.2 g/dL — ABNORMAL LOW (ref 11.2–15.7)
Hemoglobin: 8.4 g/dL — ABNORMAL LOW (ref 11.2–15.7)
IMM Granulocytes #: 0.2 10*3/uL
IMM Granulocytes #: 0.3 10*3/uL
IMM Granulocytes: 1.9 %
IMM Granulocytes: 2.7 %
Lymph # K/uL: 1.1 10*3/uL — ABNORMAL LOW (ref 1.2–3.7)
Lymph # K/uL: 1.1 10*3/uL — ABNORMAL LOW (ref 1.2–3.7)
Lymphocyte %: 9.1 %
Lymphocyte %: 9.4 %
MCH: 29 pg/cell (ref 26–32)
MCH: 29 pg/cell (ref 26–32)
MCHC: 32 g/dL (ref 32–36)
MCHC: 32 g/dL (ref 32–36)
MCV: 89 fL (ref 79–95)
MCV: 90 fL (ref 79–95)
Mono # K/uL: 0.8 10*3/uL (ref 0.2–0.9)
Mono # K/uL: 0.8 10*3/uL (ref 0.2–0.9)
Monocyte %: 6.9 %
Monocyte %: 7.5 %
Neut # K/uL: 8.9 10*3/uL — ABNORMAL HIGH (ref 1.6–6.1)
Neut # K/uL: 9.6 10*3/uL — ABNORMAL HIGH (ref 1.6–6.1)
Nucl RBC # K/uL: 0 10*3/uL (ref 0.0–0.0)
Nucl RBC # K/uL: 0 10*3/uL (ref 0.0–0.0)
Nucl RBC %: 0 /100 WBC (ref 0.0–0.2)
Nucl RBC %: 0.1 /100 WBC (ref 0.0–0.2)
Platelets: 486 10*3/uL — ABNORMAL HIGH (ref 160–370)
Platelets: 519 10*3/uL — ABNORMAL HIGH (ref 160–370)
RBC: 2.8 MIL/uL — ABNORMAL LOW (ref 3.9–5.2)
RBC: 2.9 MIL/uL — ABNORMAL LOW (ref 3.9–5.2)
RDW: 18.3 % — ABNORMAL HIGH (ref 11.7–14.4)
RDW: 18.4 % — ABNORMAL HIGH (ref 11.7–14.4)
Seg Neut %: 79.3 %
Seg Neut %: 79.7 %
WBC: 11.2 10*3/uL — ABNORMAL HIGH (ref 4.0–10.0)
WBC: 12 10*3/uL — ABNORMAL HIGH (ref 4.0–10.0)

## 2017-04-27 LAB — POCT GLUCOSE
Glucose POCT: 112 mg/dL — ABNORMAL HIGH (ref 60–99)
Glucose POCT: 117 mg/dL — ABNORMAL HIGH (ref 60–99)
Glucose POCT: 121 mg/dL — ABNORMAL HIGH (ref 60–99)
Glucose POCT: 125 mg/dL — ABNORMAL HIGH (ref 60–99)
Glucose POCT: 129 mg/dL — ABNORMAL HIGH (ref 60–99)

## 2017-04-27 LAB — COMPREHENSIVE METABOLIC PANEL
ALT: 33 U/L (ref 0–35)
AST: 24 U/L (ref 0–35)
Albumin: 2.7 g/dL — ABNORMAL LOW (ref 3.5–5.2)
Alk Phos: 333 U/L — ABNORMAL HIGH (ref 35–105)
Anion Gap: 14 (ref 7–16)
Bilirubin,Total: 0.5 mg/dL (ref 0.0–1.2)
CO2: 20 mmol/L (ref 20–28)
Calcium: 8.1 mg/dL — ABNORMAL LOW (ref 8.6–10.2)
Chloride: 98 mmol/L (ref 96–108)
Creatinine: 0.73 mg/dL (ref 0.51–0.95)
GFR,Black: 96 *
GFR,Caucasian: 83 *
Glucose: 113 mg/dL — ABNORMAL HIGH (ref 60–99)
Lab: 27 mg/dL — ABNORMAL HIGH (ref 6–20)
Potassium: 4 mmol/L (ref 3.3–5.1)
Sodium: 132 mmol/L — ABNORMAL LOW (ref 133–145)
Total Protein: 6.6 g/dL (ref 6.3–7.7)

## 2017-04-27 LAB — PHOSPHORUS: Phosphorus: 2.9 mg/dL (ref 2.7–4.5)

## 2017-04-27 LAB — TRIGLYCERIDES: Triglycerides: 241 mg/dL — AB

## 2017-04-27 LAB — MAGNESIUM: Magnesium: 2.1 mg/dL (ref 1.6–2.5)

## 2017-04-27 LAB — APTT: aPTT: 29.6 s (ref 25.8–37.9)

## 2017-04-27 LAB — ANAEROBIC CULTURE: Anaerobic Culture: 0

## 2017-04-27 LAB — PROTIME-INR
INR: 1.2 — ABNORMAL HIGH (ref 0.9–1.1)
Protime: 12.9 s (ref 10.0–12.9)

## 2017-04-27 MED ORDER — PHENOL 1.4 % MT LIQD *I*
1.0000 | OROMUCOSAL | Status: AC | PRN
Start: 2017-04-27 — End: 2017-05-04
  Administered 2017-04-27: 1 via ORAL
  Filled 2017-04-27: qty 20

## 2017-04-27 MED ORDER — POLYETHYLENE GLYCOL 3350 PO PACK 17 GM *I*
17.0000 g | PACK | Freq: Two times a day (BID) | ORAL | Status: DC
Start: 2017-04-27 — End: 2017-05-31
  Administered 2017-04-27 – 2017-05-28 (×48): 17 g via ORAL
  Filled 2017-04-27 (×59): qty 17

## 2017-04-27 MED ORDER — METOCLOPRAMIDE HCL 5 MG PO TABS *I*
5.0000 mg | ORAL_TABLET | Freq: Four times a day (QID) | ORAL | Status: DC
Start: 2017-04-27 — End: 2017-05-02
  Administered 2017-04-27 – 2017-05-01 (×19): 5 mg via ORAL
  Filled 2017-04-27 (×22): qty 1

## 2017-04-27 MED ORDER — DOCUSATE SODIUM 100 MG PO CAPS *I*
100.0000 mg | ORAL_CAPSULE | Freq: Two times a day (BID) | ORAL | Status: DC
Start: 2017-04-27 — End: 2017-04-27
  Administered 2017-04-27: 100 mg via ORAL
  Filled 2017-04-27: qty 1

## 2017-04-27 MED ORDER — BISACODYL 10 MG RE SUPP *I*
10.0000 mg | Freq: Every day | RECTAL | Status: DC
Start: 2017-04-27 — End: 2017-04-27

## 2017-04-27 MED ORDER — FAT EMULSION SOYBEAN OIL 20 % IV EMUL *WRAPPED*
60.0000 g | Freq: Every day | INTRAVENOUS | Status: AC
Start: 2017-04-27 — End: 2017-04-28
  Administered 2017-04-27: 60 g via INTRAVENOUS
  Filled 2017-04-27: qty 300

## 2017-04-27 MED ORDER — MAGNESIUM HYDROXIDE 400 MG/5ML PO SUSP *I*
15.0000 mL | Freq: Every day | ORAL | Status: DC | PRN
Start: 2017-04-27 — End: 2017-04-30
  Administered 2017-04-27 – 2017-04-29 (×2): 15 mL via ORAL
  Filled 2017-04-27 (×2): qty 30

## 2017-04-27 MED ORDER — DOCUSATE SODIUM 100 MG PO CAPS *I*
200.0000 mg | ORAL_CAPSULE | Freq: Two times a day (BID) | ORAL | Status: DC
Start: 2017-04-27 — End: 2017-06-25
  Administered 2017-04-27 – 2017-06-17 (×86): 200 mg via ORAL
  Filled 2017-04-27 (×92): qty 2

## 2017-04-27 MED ORDER — STERILE WATER FOR INJECTION (TPN USE ONLY) *I*
INTRAVENOUS | Status: AC
Start: 2017-04-27 — End: 2017-04-28
  Filled 2017-04-27: qty 840

## 2017-04-27 NOTE — Plan of Care (Signed)
Problem: Safety  Goal: Patient will remain free of falls  Outcome: Maintaining     Problem: Pain/Comfort  Goal: Patient's pain or discomfort is manageable  Outcome: Maintaining     Problem: Mobility  Goal: Functional status is maintained or improved - Geriatric  Outcome: Maintaining

## 2017-04-27 NOTE — Progress Notes (Signed)
Report Given To  [facility] RN       Descriptive Sentence / Reason for Admission   Hx: significant for fibromyalgia and pancreatic CA, on methadone for pain management who is now s/p whipple 03/29/17. Brief SICU admission for pain management,   1/28: to [facility]  1/31: her NGT was replaced for N/V with concern for gastric outlet obstruction.   2/12: mental status changes, tachypnea, worsened transmanitis,  CT-hematoma compressing portal vein and CBD. To IR for percutaneous biliary drain. Intubated for respiratory failure prior to the procedure and was readmitted to [place], septic shock requiring fluid resuscitation and pressors.   2/17: Extubated, post extubation she had increased somnolence and her methadone held for 24 h, now restarted at a reduced dose.   2/19: IR guided drainage abdominal fluid collection  2/22: to 814 PCU      Active Issues / Relevant Events   Assumed care of pt 0700-1130. VSS, afebrile, A&O x 3. PRN dilaudid given x 2 for break through pain with short term positive effects. Calorie count ordered. Three bites of applesauce is all pt ate. Refused breakfast. Report given to [facility] and pt transferred over. For VS and assessments see docflowsheets      To Do List   Goal: SBP > 90 or MAP > 60   Flush biliary drain BID w/ 5cc NS (2000 and 0800)      Anticipatory Guidance / Discharge Planning  ---Critical Care Bundle---  Fluids & Electrolytes: none  Nutrition/last BM: TPN/Lipids, regular diet (not eating much PO)  Mobility Plan/Ability: sara lift to chair  Respiratory weaning (PST. TC)/pulmonary toilet: IS, Flutter-pt refusing  Neuro/sedation/pain/sedation vacation: PRN dilaudid, lidocane patch; ATC tylenol, methadone, PRN ativan  Delirium: Yes (based on CAM ICU)  Sleep Hygiene: Yes-order?  List of antimicrobials: zosyn, Caspofungin  Drains/Lines/Tubes: PIV x 1, LIJ TLCVC  Foley/Indication: No  DVT prophylaxis/aPTT: sq heparin, IPCs  Indication for gastric acid suppression: protonix  Type and frequency of  Labs: Daily: CBC&D, BMP, Mag, Phos; CMP, APTT, PTINR,   Triglycerides-T,TH,Sat  Family Concerns and Updates: Daughters at bedside 2/22  Patient's preferred name (AKA): Fiorela  Code Status: Full  Disposition/Level: PCU  Reiterate Plan for the day.  Call out to [facility]  Continue TPN due to poor PO intake   Calorie count

## 2017-04-27 NOTE — Progress Notes (Addendum)
Surgical Oncology/Hepatobiliary Surgery Progress Note     LOS: 29 days     Subjective:    No acute events overnight.  +flatus/+BM. Tolerating clears and small amount of food - reports she ate applesauce and pineapples.  More awake this morning.  Reports left lower lateral chest/LUQ pain.  CXR yesterday with moderate but decreased left pleural effusion and opacification - ? atelectasis vs infection.  WBC 12 with 9.6N (from 13.1 with 12N).    Objective:    Vitals Sign Ranges for Past 24 Hours:  BP: (111-135)/(54-89)   Temp:  [35.8 C (96.4 F)-36.6 C (97.8 F)]   Temp src: Temporal (02/23 0800)  Heart Rate:  [83-130]   Resp:  [18-36]   SpO2:  [97 %-100 %]       Physical Exam:    General Appearance: in NAD, appears catatonic  Cardiac: regular rate  Respiratory: non-labored breathing on room air  Abdomen: soft, mildly tender, pre-existing ecchymosis, biliary tube with bilious output  Extremities: warm    Labs:    CBC:    Recent Labs  Lab 04/27/17  0006 04/25/17  2338 04/25/17  0117 04/23/17  2343 04/22/17  2354  04/21/17  2348   WBC 12.0* 13.1* 16.5* 18.8* 19.1*  --  15.5*   Hemoglobin 8.4* 8.3* 8.9*   10.6* 8.9*   9.4* 9.3*   9.8*  < > 8.7*   Hematocrit 26* 26* 27* 27* 28*  --  26*   Platelets 486* 411* 383* 340 371*  --  353   < > = values in this interval not displayed.    Metabolic Panel:    Recent Labs  Lab 04/27/17  0006 04/25/17  2338 04/25/17  0117 04/23/17  2343 04/22/17  2354 04/21/17  2348   Sodium 132* 134 132* 135 132* 137   Potassium 4.0 4.0 3.9 3.7 3.8 3.7   Chloride 98 100 99 101 98 97   CO2 20 19* 19* 19* 19* 24   UN 27* 33* 36* 41* 47* 52*   Creatinine 0.73 0.72 0.77 0.92 0.95 1.11*   Glucose 113* 112* 114* 125* 134* 155*   Calcium 8.1* 7.9* 7.9* 8.1* 7.9* 8.4*   Magnesium 2.1 2.2 2.2 2.2 2.1 2.0   Phosphorus 2.9 3.0 2.8 3.7 3.6 5.5*          Assessment:    72 y.o. female with h/o recent PE and pancreatic cancer POD # 29  status post whipple procedure complicated by delayed gastric emptying.  NGT  replaced on 04/04/17. UGI on 04/12/17 concerning for obstruction just beyond the [place] in the efferent limb.  Course complicated on 0/35/59 by rising LFTs and sepsis with CT concerning for cholangitis given biliary tree obstruction and PV compression due to hematoma and afferent limb distention in setting of PE treatment. Is s/p IR PTC on 04/16/17 and IR percutaneous aspiration of anterior abdominal fluid collection on 04/24/17.      Plan:    - Will discuss need for CT chest given known effusion and new report of pain at left lower chest/LUQ  - Continue regular diet, added ensure, calorie counts  - Continue TPN  - IV caspofungin/Zosyn x 10 days (through 3/2)  with repeat CT abdomen/pelvis  - F/u repeat blood cultures  - Appreciate ID recommendations  - SCDs, heparin SC (hold therapeutic anticoagulation) for VTE ppx  - Appreciate SICU care      Donata Duff, MD  04/27/2017     9:12  AM  General Surgery Resident      HPB-GI Attending Addendum:   I personally examined the patient, reviewed the notes, and discussed the plan of care with the residents and patient. Agree with detailed resident's note. Please see the note above for details of history, exam, labs, assessment/plan which reflect my input.      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      Gastrojejunostomy obstruction resolved. Nasogastric tube removed.  Diet advanced but poor oral intake. Anorexia.   Afferent limb bowel obstruction due to hematoma. Resolved.   Hyperglycemia without need for insulin. Monitor.   Post operative anemia. No evidence of bleeding.  Volume overload. Self diuresis. Monitor fluid.   Pulmonary insufficiency improvement. Continue ISB  Bilateral pleural effusions with lung compression. Left greater than right. May require aspiration.   Hypoalbuminemia. Malnutrition. TPN. Encourage oral intake. reglan started;   Somnolence.  Improving. Depression ?   Leukocytosis with slow improvement.        Delano Metz, MD, FACS  Hepatobiliary, Pancreas & GI Surgery

## 2017-04-27 NOTE — Interdisciplinary Rounds (Addendum)
Interdisciplinary Rounds Note    Amanda Galloway is a 75 y F with [place] significant to fibromyalgia and pancreatic cancer on methadone for pain management who is now s/p whipple 03/29/17. Her intraoperative and immediate post-operative course were uncomplicated with a brief SICU admission postop for pain management, discharged to floor 1/28. 1/31 her NGT was replaced for nausea and vomiting with concern for gastric outlet obstruction.    04/27/2017 Daily Interdisciplinary Goals of Care  Palliative care following for pain management  Aggressive pulmonary toilet  Called out to [facility]    2/24 - to [facility] 5 at 11:55 on 2/23 - + flatus, +BM (s/p enema last night) - regular diet (not eating much) - continues on TPN. Palliative care following for pain management. PT recommending SNF rehab for discharge.

## 2017-04-27 NOTE — Consults (Signed)
Medical Nutrition Therapy Brief Note: Nutrition Assessment/Follow Up    Hx: per chart 72 y.o. female with h/o recent PE and pancreatic cancer POD # 29  status post whipple procedure complicated by delayed gastric emptying.  NGT replaced on 04/04/17. UGI on 04/12/17 concerning for obstruction just beyond the [place] in the efferent limb.  Course complicated on 1/61/09 by rising LFTs and sepsis with CT concerning for cholangitis given biliary tree obstruction and PV compression due to hematoma and afferent limb distention in setting of PE treatment. Is s/p IR PTC on 04/16/17 and IR percutaneous aspiration of anterior abdominal fluid collection on 04/24/17.    Patient transferred to [facility] from ICU 2/23. Calorie Counts ordered to start 2/23. Remains on TPN. Ordered for Ensure 1.0 TID and on regular diet. Met with patient who had lunch tray in front of her-- fruit platter and cheese stick w/ juice. Appeared very tired and had not eaten much. Encouraged her to try Ensure and small frequent meals as that may be better tolerated at this time.     TPN Initiation  The Clinical Nutrition Specialist agrees TPN is appropriate for this patient.Most recently reviewed at [place] on 04/17/17.  Original Start Date: 2/5  D/C Plan: TPN will be discontinued once GOO resolves     Labs: Na 132 (intermittently low- about every other day), BUN 28, K, Mg, P, corrected Ca WNL. 2/23 triglycerides 241. 2/15 total carnitine low.    Nutrition Intervention:   1. For Sunday 04/28/17: continue 2-in-1 TPN with lipids, via adult central line    Rate @ 75 ml/hr x 24 hrs (1800 ml fluid per day)   Amino acids 70 g/L   Dextrose 150 g/L   Na+ 110 meq/L    K+ 30 meq/L   Ca++ 10 meq/L    Mg 5 meq/L   Phos 12 mmol/L    Standard MVI   Standard trace elements   ADD: 200 mg levocarnitine   Chloride: Acetate ratio- 1/3 chloride  AND: 60 g/day lipids (300 ml/day 20% soy lipid emulsion, given over 12 hours)    TPN with lipids will provide a total of 2100 ml fluid, 2022  kcal, 126 g protein and 270 g dextrose/day.     2. Continue Ensure 1.0 TID and regular diet. Unit RD to follow up with calorie count results at their resolution.     4. Please continue parenteral monitoring panel. Recommended Monitoring Parameters:   PO4, Mg, BMP daily for 3 days or until stable, then 3 times per week. Replete lytes via IV as needed.    CMP and triglycerides weekly. Hold lipids if TG>400.   Check BGs q 6 hours until stable on goal dextrose.   Monitor weight daily until fluid balance is stable, then weekly.   Accurate I&Os.     5.  Consider checking vit D3 level. TPN does not provide full DRI of vit D.  Renal/liver injuries increase risk of deficiency.        Unit RD to follow per high risk protocol,  Ammie Ferrier [place], [place] [phone]

## 2017-04-27 NOTE — Progress Notes (Signed)
SICU Progress Note    Amanda Galloway is a 50 y F with [place] significant to fibromyalgia and pancreatic cancer on methadone for pain management who is now s/p whipple 03/29/17. Her intraoperative and immediate post-operative course were uncomplicated with a brief SICU admission postop for pain management, discharged to floor 1/28. 1/31 her NGT was replaced for nausea and vomiting with concern for gastric outlet obstruction. 2/12 pt developed mental status changes, tachypnea and worsening transaminitis. CT abdomen showed a hematoma compressing portal vein and CBD. She was taken to IR for percutaneous biliary drain. She required intubation for respiratory failure prior to the procedure and was readmitted to SICU postop for management. She developed septic shock requiring fluid resuscitation and pressors. Her sepsis improved and she was weaned from pressors and extubated to HFNC 2/17 am, she has since been weaned off supplemental oxygen. She had IR drainage of a fluid collection on 2/19 with enterobacter and candida glabrata. ID was consulted 2/21 and she was started on caspo in addition to zosyn.     Interval History: Transferred to step down yesterday. Started on a regular diet, did not take much PO.     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 Ron Agee  [facility] course c/b gastric outlet obstruction d/t severe compression of the main portal vein and common bile duct by hematoma, PTC drain 2/12 in IR  -Continue TPN with SSI for glucose  control  - CT 2/18 with persistent fluid collection   - IR for drainage, 60 mL 2/19   - Gram stain with GNB and GPC in pairs and chains - culture prelim enterobacter cloacae and gandida glabrata  - Caspo/Zosyn x10 days from 2/19 per ID      Sepsis     - Occurring in setting of biliary obstruction s/p PTC external/internal placement for source control  - BCx 2/12 Enterobacter 72hrs to growth, Zosyn (2/12-2/18) Vanco (2/12-2/14),  - ID following, BC 2/21 NGTD, Caspo zosyn x 10 days from 2/19       hx of Pulmonary embolism     - PE 01/18/17 on CT chest   - This hospitalization has been on therapeutic Lovenox, and Heparin gtt  - Currently holding therapeutic anticoagulation in setting of hematoma and ongoing risk for bleeding  - Heparin SQ TID  - Per HBP - will not restart therapeutic anticoagulation in the near future      Cancer associated pain     Methadone at home  Pain management guided by patient's primary pain physician Dr Irven [place]  - QTc acceptable, monitor for prolongation  - consider PCA when appropriate  - Methadone 22m PO q8h  - Palliative following, dilaudid PRN, ativan      Depression     -Continue Elavil and Lexapro      Hypothyroidism     -Synthroid  Diagnoses for this hospitalization include:  Acute kidney injury without ATN    Author: Jeri Cos, MD  as of: 04/27/2017  at: 7:14 AM     ---Critical Care Bundle---  Fluids & Electrolytes: none  Nutrition/last BM: TPN/Lipids, regular diet (not eating much PO)  Mobility Plan/Ability: sara lift to chair  Respiratory weaning (PST. TC)/pulmonary toilet: IS, Flutter-pt refusing  Neuro/sedation/pain/sedation vacation: PRN dilaudid, lidocane patch; ATC tylenol, methadone, PRN ativan  Delirium: No (based on CAM ICU)  Sleep Hygiene: Yes-order?  List of antimicrobials: zosyn, Caspofungin  Drains/Lines/Tubes: PIV x 1, LIJ TLCVC  Foley/Indication: No  DVT prophylaxis/aPTT: sq heparin, IPCs  Indication for gastric acid suppression: protonix  Type and  frequency of Labs: Daily: CBC&D, BMP, Mag, Phos; CMP, APTT, PTINR,   Triglycerides-T,TH,Sat  Family Concerns and Updates: Daughters at bedside 2/22  Patient's preferred name (AKA): Amanda Galloway  Code Status: Full  Disposition/Level: possible TTF to [facility] 5    ICU Attending Plan:    I have seen and evaluated the patient personally with the Resident/NP/PA team.    All nursing documentation, laboratory data, test results, and radiographs were reviewed and interpreted by me. I have established the management plan for this patient's critical illness and have been immediately available to assist with patient care. I agree with the database, findings, and plan of care recorded in the above note. Details I have highlighted are below.     Patient seems to be stable. Working on PO intake. Pain control stable, appreciate Palliative care team assistance.  - continue TPN given poor PO intake  - start calorie count today  - continue abx course, will need reimaging next week likely.  - okay TTF    This is a low complexity patient.     [   ] This patient is critically ill with at least 1 organ system failure. The care I have delivered involved high complexity decision making to assess, manipulate, and support vital system function(s), to treat the vital organ system failure and/or prevent further deterioration of the patient's condition. All nursing documentation, laboratory data, test results, and radiographs were reviewed and interpreted by me. I have established the management plan for this patient's critical illness and have been immediately available to assist with patient care. My documented total critical care time reflects my own patient care time and does not include teaching or procedure time.     Critical care time: Personal time spent - exclusive of performance of any procedures performed in the last 24 hours:  10 minutes      Signed by: Alberteen Sam, MD as of 04/27/2017 at 9:05 AM.

## 2017-04-27 NOTE — Progress Notes (Signed)
Physical Therapy Progress Note   04/27/17 1535   Visit Number   Visit Number [facility]) / Treatment Day (HH) 4   Precautions/Observations   Precautions used Yes   LDA Observation IV lines;Drain   Fall Precautions General falls precautions   Other Daughters present with permission and supportive throughout session.   Pain Assessment   *Is the patient currently in pain? Yes   Pain (Before,During, After) Therapy Before;During;After   Pain Location Abdomen;Flank   Pain Orientation Right   Pain Descriptors Pressure;Sore   Pain Intervention(s) Refer to nursing for pain management;Repositioned   Additional comments RN in with pain medication during session.   Cognition   Arousal/Alertness Delayed responses to stimuli   Following Commands Follows all commands and directions without difficulty   Additional Comments Patient not agreeable to mobilize, at first, but daughter encouraging to participate.   Bed Mobility   Bed mobility Tested   Supine to Sit Minimum assist ;Moderate assist ;1 person assist;Head of bed elevated;Side rails up (#)   Additional comments Assist and cues for log roll technique. Most assist for supine to sidelying. Min-mod assist to scoot to the edge of the bed.   Transfers   Transfers Tested   Sit to Stand Contact guard;Minimum ;1 person assist   Stand to sit Contact guard;1 person assist   Transfer Assistive Device Other (comment)  (hand held assist)   Mobility   Mobility Tested   Gait Pattern Decreased L step length;Decreased R step length   Ambulation Assist Contact guard;1 person assist   Ambulation Distance (Feet) 3   Ambulation Assistive Device hand held assist   Additional comments Cues for upright posture throughout. Patient c/o increased pain durin gmobility   Therapeutic Exercises   Additional comments Encouraged frequent out of bed to progress towards ambulation with chair follow.   Balance   Balance Tested   Sitting - Static Contact guard;Supported   Sitting - Dynamic Min assist;Supported    Standing - Cytogeneticist guard;Supported   Standing - Technical brewer guard;Supported   PT AM-PAC Mobility   Turning over in bed? 1   Sitting down on and standing up from a chair with arms? 1   Moving from lying on back to sitting on the side of the bed? 1   Moving to and from a bed to a chair? 3   Need to walk in hospital room? 3   Climbing 3 - 5 steps with a railing? 1   Total Raw Score 10   Standardized Score 32.29   CMS 1-100% Score 77   Medicare Functional Limit Modifiers CL  60 - < 80% impaired, limited, restricted   Assessment   Brief Assessment Remains appropriate for skilled therapy   Patient / Family Goal Get stronger and go home.   Additional Comments Patient currently limited by right flank pain. Would benefit from pre-medication for mobility next session.   Plan/Recommendation   Treatment Interventions [facility]   PT Frequency 2-4x/wk   Mobility Recommendations Out of bed with assist.   Discharge Recommendations [facility]   Assessment/Recommendations Reviewed With: Patient;Nursing;Family   Next PT Visit Progress ambulation with chair follow and RW; premedicate for pain.   Time Calculation   PT Timed Codes 26   PT Untimed Codes 0   PT Unbilled Time 5   PT Total Treatment 26   Plan and Onset date   Plan of Care Date 04/21/17   Treatment Start Date 04/21/17   PT Charges   $  PT [facility] Charges Functional Training - code [phone] (52minx2)   Hinda Glatter, PT, [place]  Pager #  [phone]

## 2017-04-27 NOTE — Plan of Care (Signed)
Cognitive function     Cognitive function will be maintained Maintaining          Cognitive function     Cognitive function will be maintained Maintaining        Nutrition     Patient's nutritional status is maintained or improved Maintaining        Post-Operative Complications     Patient will remain free from symptoms of infection-post op Maintaining        Post-Operative Hemodynamic Stability     Maintain Hemodynamic Stability Maintaining        Safety     Patient will remain free of falls Maintaining          Bowel Elimination     Elimination patterns are normal or improving Progressing towards goal        Mobility     Functional status is maintained or improved - Geriatric Progressing towards goal        Pain/Comfort     Patient's pain or discomfort is manageable Progressing towards goal        Post-Operative Bowel Elimination     Elimination pattern is normal or improving Progressing towards goal

## 2017-04-27 NOTE — Progress Notes (Signed)
Report Given To  Harlon Flor, RN       Descriptive Sentence / Reason for Admission   Hx: significant for fibromyalgia and pancreatic CA, on methadone for pain management who is now s/p whipple 03/29/17. Brief SICU admission for pain management,   1/28: to [facility]  1/31: her NGT was replaced for N/V with concern for gastric outlet obstruction.   2/12: mental status changes, tachypnea, worsened transmanitis,  CT-hematoma compressing portal vein and CBD. To IR for percutaneous biliary drain. Intubated for respiratory failure prior to the procedure and was readmitted to [facility], septic shock requiring fluid resuscitation and pressors.   2/17: Extubated, post extubation she had increased somnolence and her methadone held for 24 h, now restarted at a reduced dose.   2/19: IR guided drainage abdominal fluid collection  2/22: to 814 PCU      Active Issues / Relevant Events   Assumed care of pt at 1900.  Pt lethargic at shift arrival.  NP Venia Minks at bedside.  Pt more awake later in shift.  Pt withdrawn.  Engaged pt in discussion and provided support to pt and daughters.  Dilaudid given for pain with positive effect.  Chloraseptic spray given for sore throat with positive effect.  Pt had a short run of PVCs and PACs while using the bedpan.  Pt asymptomatic.  NP Venia Minks evaluated tele strip-no new orders.  See flow sheets and MAR for full shift documentation.      To Do List   Goal: SBP > 90 or MAP > 60   Flush biliary drain BID w/ 5cc NS (2000 and 0800)      Anticipatory Guidance / Discharge Planning  ---Critical Care Bundle---  Fluids & Electrolytes: none  Nutrition/last BM: TPN/Lipids, regular diet (not eating much PO)  Mobility Plan/Ability: sara lift to chair  Respiratory weaning (PST. TC)/pulmonary toilet: IS, Flutter-pt refusing  Neuro/sedation/pain/sedation vacation: PRN dilaudid, lidocane patch; ATC tylenol, methadone, PRN ativan  Delirium: Yes (based on CAM ICU)  Sleep Hygiene: Yes-order?  List of  antimicrobials: zosyn, Caspofungin  Drains/Lines/Tubes: PIV x 1, LIJ TLCVC  Foley/Indication: No  DVT prophylaxis/aPTT: sq heparin, IPCs  Indication for gastric acid suppression: protonix  Type and frequency of Labs: Daily: CBC&D, BMP, Mag, Phos; CMP, APTT, PTINR,   Triglycerides-T,TH,Sat  Family Concerns and Updates: Daughters at bedside 2/22  Patient's preferred name (AKA): Nicholle  Code Status: Full  Disposition/Level: PCU  Reiterate Plan for the day.  Georgeanna Harrison, RN

## 2017-04-27 NOTE — Progress Notes (Signed)
Handoff received from [place], [place]. Pt was transferred in bed from 814 on RA. Pt arrived to the unit @ 1155. Pt was transferred to unit bed via hover matt. Pt A&O x3 but fatigued and lethargic, easily arousable to speech. Pt was oriented to unit and call bell. VSS upon admission. Skin check completed. IV dilaudid x1 for abd pain. KUB done at bedside. 1 urine occurrence. TPN infusing @ 75cc. Biliary tube draining. Writer encouraged PO intake. Report given to oncoming shift. Will continue to monitor.        04/27/17 1155   Vital Signs   Temp 36.9 C (98.4 F)   Temp src TEMPORAL   Heart Rate 84   Heart Rate Source Automated/oximeter   Resp 22   BP 130/70   BP Method Manual   BP Location Left arm   Patient Position Lying   Oxygen Therapy   SpO2 96 %   Oximetry Source Rt Hand   O2 Device None (Room air)   Current Pain Assessment   Pain Assessment / Reassessment Assessment   Pain Scale 0-10 (Numeric Scale for Pain Intensity)   0-10 Scale 7   Pain Location/Orientation Abdomen   Pain Descriptors Aching   (T) Pain Intervention(s) for current pain Repositioned   No Intervention/MAR Intervention(s) No Intervention required     4 Eyed Skin Assessment        Amanda Galloway is a 72 y.o. year old female, admitted/transferred to unit on 2/23 at 1155.    []  New Admission  [x]  Transfer: Unit 814    4 Eyed Skin Assessment:  Skin Breakdown Present: Yes []   No [x]    IF Yes - Suspected Pressure Injury: Yes []   No [x]    IF No- Are there other skin concerns?: N/A        Location of Pressure Ulcer or other skin concern(s): N/A   Description: N/A           Interventions Put in Place:  []  Wound Consult Ordered  []  Low Air Loss mattress (Braden < 15)  []  Two pillow turning and repositioning  []  Protective Allevyn Gentle dressing  []  Prevalon boot  []  Pillow heel offloading  []  Waffle chair cushion    PT eval ordered    Complete 4 Eyed Skin Check Completed with: Christiana Pellant, RN

## 2017-04-27 NOTE — Plan of Care (Signed)
Bowel Elimination     Elimination patterns are normal or improving Maintaining        Cognitive function     Cognitive function will be maintained Maintaining        Mobility     Functional status is maintained or improved - Geriatric Maintaining        Nutrition     Patient's nutritional status is maintained or improved Maintaining        Pain/Comfort     Patient's pain or discomfort is manageable Maintaining        Post-Operative Bowel Elimination     Elimination pattern is normal or improving Maintaining        Post-Operative Complications     Patient will remain free from symptoms of infection-post op Maintaining        Post-Operative Hemodynamic Stability     Maintain Hemodynamic Stability Maintaining        Safety     Patient will remain free of falls Maintaining

## 2017-04-27 NOTE — Plan of Care (Signed)
Impaired Gas Exchange     Patient will maintain adequate oxygenation Completed or Resolved          Nutrition     Patient's nutritional status is maintained or improved Goal not met    Poor appetite      Bowel Elimination     Elimination patterns are normal or improving Maintaining        Cognitive function     Cognitive function will be maintained Maintaining        Mobility     Functional status is maintained or improved - Geriatric Maintaining        Pain/Comfort     Patient's pain or discomfort is manageable Maintaining        Post-Operative Bowel Elimination     Elimination pattern is normal or improving Maintaining        Post-Operative Complications     Patient will remain free from symptoms of infection-post op Maintaining        Post-Operative Hemodynamic Stability     Maintain Hemodynamic Stability Maintaining        Safety     Patient will remain free of falls Maintaining

## 2017-04-28 LAB — COMPREHENSIVE METABOLIC PANEL
ALT: 31 U/L (ref 0–35)
AST: 26 U/L (ref 0–35)
Albumin: 2.8 g/dL — ABNORMAL LOW (ref 3.5–5.2)
Alk Phos: 313 U/L — ABNORMAL HIGH (ref 35–105)
Anion Gap: 13 (ref 7–16)
Bilirubin,Total: 0.4 mg/dL (ref 0.0–1.2)
CO2: 22 mmol/L (ref 20–28)
Calcium: 8.4 mg/dL — ABNORMAL LOW (ref 8.6–10.2)
Chloride: 96 mmol/L (ref 96–108)
Creatinine: 0.67 mg/dL (ref 0.51–0.95)
GFR,Black: 102 *
GFR,Caucasian: 89 *
Glucose: 102 mg/dL — ABNORMAL HIGH (ref 60–99)
Lab: 29 mg/dL — ABNORMAL HIGH (ref 6–20)
Potassium: 4.2 mmol/L (ref 3.3–5.1)
Sodium: 131 mmol/L — ABNORMAL LOW (ref 133–145)
Total Protein: 6.8 g/dL (ref 6.3–7.7)

## 2017-04-28 LAB — POCT GLUCOSE
Glucose POCT: 112 mg/dL — ABNORMAL HIGH (ref 60–99)
Glucose POCT: 146 mg/dL — ABNORMAL HIGH (ref 60–99)
Glucose POCT: 110 mg/dL — ABNORMAL HIGH (ref 60–99)

## 2017-04-28 LAB — PHOSPHORUS: Phosphorus: 3.2 mg/dL (ref 2.7–4.5)

## 2017-04-28 LAB — MAGNESIUM: Magnesium: 2.1 mg/dL (ref 1.6–2.5)

## 2017-04-28 LAB — PROTIME-INR
INR: 1.2 — ABNORMAL HIGH (ref 0.9–1.1)
Protime: 13.4 s — ABNORMAL HIGH (ref 10.0–12.9)

## 2017-04-28 LAB — APTT: aPTT: 27.9 s (ref 25.8–37.9)

## 2017-04-28 MED ORDER — LACTULOSE 20 GM/30ML PO SOLN *WRAPPED*
20.0000 g | Freq: Two times a day (BID) | ORAL | Status: DC
Start: 2017-04-28 — End: 2017-04-29
  Administered 2017-04-29: 20 g via ORAL
  Filled 2017-04-28 (×5): qty 30

## 2017-04-28 MED ORDER — HYDROMORPHONE HCL 2 MG/ML IJ SOLN *WRAPPED*
0.5000 mg | INTRAMUSCULAR | Status: DC | PRN
Start: 2017-04-28 — End: 2017-04-28
  Administered 2017-04-28: 0.5 mg via INTRAVENOUS

## 2017-04-28 MED ORDER — FAT EMULSION SOYBEAN OIL 20 % IV EMUL *WRAPPED*
60.0000 g | Freq: Every day | INTRAVENOUS | Status: DC
Start: 2017-04-28 — End: 2017-04-28

## 2017-04-28 MED ORDER — HYDROMORPHONE HCL 2 MG/ML IJ SOLN *WRAPPED*
0.5000 mg | INTRAMUSCULAR | Status: DC | PRN
Start: 2017-04-28 — End: 2017-04-29
  Administered 2017-04-28: 0.5 mg via INTRAVENOUS
  Filled 2017-04-28: qty 1

## 2017-04-28 MED ORDER — HYDROMORPHONE HCL 2 MG/ML IJ SOLN *WRAPPED*
1.0000 mg | INTRAMUSCULAR | Status: DC | PRN
Start: 2017-04-28 — End: 2017-04-29
  Administered 2017-04-28 – 2017-04-29 (×10): 1 mg via INTRAVENOUS
  Filled 2017-04-28 (×11): qty 1

## 2017-04-28 MED ORDER — HYDROMORPHONE HCL 2 MG/ML IJ SOLN *WRAPPED*
INTRAMUSCULAR | Status: DC
Start: 2017-04-28 — End: 2017-04-28
  Filled 2017-04-28: qty 1

## 2017-04-28 MED ORDER — STERILE WATER FOR INJECTION (TPN USE ONLY) *I*
INTRAVENOUS | Status: AC
Start: 2017-04-28 — End: 2017-04-29
  Filled 2017-04-28: qty 840

## 2017-04-28 MED ORDER — LACTULOSE 20 GM/30ML PO SOLN *WRAPPED*
10.0000 g | Freq: Two times a day (BID) | ORAL | Status: DC
Start: 2017-04-28 — End: 2017-04-28
  Administered 2017-04-28: 10 g via ORAL
  Filled 2017-04-28 (×3): qty 15

## 2017-04-28 MED ORDER — HYDROMORPHONE HCL 2 MG/ML IJ SOLN *WRAPPED*
1.0000 mg | INTRAMUSCULAR | Status: DC | PRN
Start: 2017-04-28 — End: 2017-04-28

## 2017-04-28 MED ORDER — STERILE WATER FOR INJECTION (TPN USE ONLY) *I*
INTRAVENOUS | Status: DC
Start: 2017-04-28 — End: 2017-04-28

## 2017-04-28 MED ORDER — FAT EMULSION SOYBEAN OIL 20 % IV EMUL *WRAPPED*
60.0000 g | Freq: Every day | INTRAVENOUS | Status: AC
Start: 2017-04-28 — End: 2017-04-29
  Administered 2017-04-28: 60 g via INTRAVENOUS
  Filled 2017-04-28: qty 300

## 2017-04-28 NOTE — Progress Notes (Addendum)
Surgical Oncology/Hepatobiliary Surgery Progress Note     LOS: 30 days     Subjective:    No acute events overnight.  +flatus/+BM. Tolerating some food.  Reports continued left-sided abdominal pain.  KUB yesterday with mildly dilated contrast-filled cecum, remainder of colon normal in caliber  Pt had BMs x2 s/p enema last night  Pt not tolerating much PO, TPN infusing    Objective:    Vitals Sign Ranges for Past 24 Hours:  BP: (114-132)/(58-70)   Temp:  [36 C (96.8 F)-36.9 C (98.4 F)]   Temp src: Temporal (02/24 0739)  Heart Rate:  [82-88]   Resp:  [16-22]   SpO2:  [96 %-100 %]       Physical Exam:    General Appearance: in NAD, appears catatonic  Cardiac: regular rate  Respiratory: non-labored breathing on room air  Abdomen: soft, mildly tender, pre-existing ecchymosis, biliary tube with bilious output  Extremities: warm    Labs:    CBC:    Recent Labs  Lab 04/27/17  2330 04/27/17  0006 04/25/17  2338 04/25/17  0117 04/23/17  2343 04/22/17  2354   WBC 11.2* 12.0* 13.1* 16.5* 18.8* 19.1*   Hemoglobin 8.2* 8.4* 8.3* 8.9*   10.6* 8.9*   9.4* 9.3*   9.8*   Hematocrit 25* 26* 26* 27* 27* 28*   Platelets 519* 486* 411* 383* 340 816*       Metabolic Panel:    Recent Labs  Lab 04/27/17  2330 04/27/17  0006 04/25/17  2338 04/25/17  0117 04/23/17  2343 04/22/17  2354   Sodium 131* 132* 134 132* 135 132*   Potassium 4.2 4.0 4.0 3.9 3.7 3.8   Chloride 96 98 100 99 101 98   CO2 22 20 19* 19* 19* 19*   UN 29* 27* 33* 36* 41* 47*   Creatinine 0.67 0.73 0.72 0.77 0.92 0.95   Glucose 102* 113* 112* 114* 125* 134*   Calcium 8.4* 8.1* 7.9* 7.9* 8.1* 7.9*   Magnesium 2.1 2.1 2.2 2.2 2.2 2.1   Phosphorus 3.2 2.9 3.0 2.8 3.7 3.6          Assessment:    72 y.o. female with h/o recent PE and pancreatic cancer POD # 30  status post whipple procedure complicated by delayed gastric emptying.  NGT replaced on 04/04/17. UGI on 04/12/17 concerning for obstruction just beyond the [place] in the efferent limb.  Course complicated on 08/22/67 by rising  LFTs and sepsis with CT concerning for cholangitis given biliary tree obstruction and PV compression due to hematoma and afferent limb distention in setting of PE treatment. Is s/p IR PTC on 04/16/17 and IR percutaneous aspiration of anterior abdominal fluid collection on 04/24/17.      Plan:    - Will discuss need for CT chest given known effusion and new report of pain at left lower chest/LUQ  - Continue regular diet, added ensure, calorie counts  - Continue TPN  - IV caspofungin/Zosyn x 10 days (through 3/2)  with repeat CT abdomen/pelvis  - F/u repeat blood cultures  - Appreciate ID recommendations  - SCDs, heparin SC (hold therapeutic anticoagulation) for VTE ppx  - Dispo: pending clinical course      Amanda Cirri, MD  04/28/2017     7:48 AM   General Surgery Resident      HPB-GI Attending Addendum:   I personally examined the patient, reviewed the notes, and discussed the plan of care with the  residents and patient. Agree with detailed resident's note. Please see the note above for details of history, exam, labs, assessment/plan which reflect my input.      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      Anorexia.   Hyperglycemia without need for insulin. Monitor.   Post operative anemia. No evidence of bleeding.  Volume overload. Self diuresis. Monitor fluid.   Pulmonary insufficiency improvement. Continue ISB  Bilateral pleural effusions with lung compression. Left greater than right. May require aspiration.   Pulmonary embolism. Present on admission. No evidence of worsening.   Hypoalbuminemia. Malnutrition. TPN. Encourage oral intake. Reglan started. Nutritional supplementation.  Somnolence. Improving.   Depression. Encouragement provided. Anti-depressant therapy.     Leukocytosis with slow improvement.   Constipation with stool impaction. Will work on aggressive stool regimen        Amanda Metz,  MD, FACS  Hepatobiliary, Pancreas & GI Surgery

## 2017-04-28 NOTE — Plan of Care (Signed)
Bowel Elimination     Elimination patterns are normal or improving Maintaining        Cognitive function     Cognitive function will be maintained Maintaining        Mobility     Functional status is maintained or improved - Geriatric Maintaining        Nutrition     Patient's nutritional status is maintained or improved Maintaining        Pain/Comfort     Patient's pain or discomfort is manageable Maintaining        Post-Operative Bowel Elimination     Elimination pattern is normal or improving Maintaining        Post-Operative Complications     Patient will remain free from symptoms of infection-post op Maintaining        Post-Operative Hemodynamic Stability     Maintain Hemodynamic Stability Maintaining        Safety     Patient will remain free of falls Maintaining

## 2017-04-29 ENCOUNTER — Inpatient Hospital Stay: Payer: Medicare (Managed Care)

## 2017-04-29 DIAGNOSIS — R5383 Other fatigue: Secondary | ICD-10-CM

## 2017-04-29 DIAGNOSIS — K651 Peritoneal abscess: Secondary | ICD-10-CM

## 2017-04-29 DIAGNOSIS — R109 Unspecified abdominal pain: Secondary | ICD-10-CM

## 2017-04-29 DIAGNOSIS — K8309 Other cholangitis: Secondary | ICD-10-CM

## 2017-04-29 DIAGNOSIS — G893 Neoplasm related pain (acute) (chronic): Secondary | ICD-10-CM

## 2017-04-29 DIAGNOSIS — K5900 Constipation, unspecified: Secondary | ICD-10-CM

## 2017-04-29 LAB — PREALBUMIN: Prealbumin: 17 mg/dL — ABNORMAL LOW (ref 20–40)

## 2017-04-29 LAB — COMPREHENSIVE METABOLIC PANEL
ALT: 27 U/L (ref 0–35)
AST: 26 U/L (ref 0–35)
Albumin: 2.9 g/dL — ABNORMAL LOW (ref 3.5–5.2)
Alk Phos: 331 U/L — ABNORMAL HIGH (ref 35–105)
Anion Gap: 14 (ref 7–16)
Bilirubin,Total: 0.5 mg/dL (ref 0.0–1.2)
CO2: 24 mmol/L (ref 20–28)
Calcium: 8.6 mg/dL (ref 8.6–10.2)
Chloride: 92 mmol/L — ABNORMAL LOW (ref 96–108)
Creatinine: 0.67 mg/dL (ref 0.51–0.95)
GFR,Black: 102 *
GFR,Caucasian: 89 *
Glucose: 104 mg/dL — ABNORMAL HIGH (ref 60–99)
Lab: 26 mg/dL — ABNORMAL HIGH (ref 6–20)
Potassium: 4.7 mmol/L (ref 3.3–5.1)
Sodium: 130 mmol/L — ABNORMAL LOW (ref 133–145)
Total Protein: 7.2 g/dL (ref 6.3–7.7)

## 2017-04-29 LAB — CBC AND DIFFERENTIAL
Baso # K/uL: 0.1 10*3/uL (ref 0.0–0.1)
Basophil %: 0.7 %
Eos # K/uL: 0.2 10*3/uL (ref 0.0–0.4)
Eosinophil %: 1.8 %
Hematocrit: 27 % — ABNORMAL LOW (ref 34–45)
Hemoglobin: 8.4 g/dL — ABNORMAL LOW (ref 11.2–15.7)
IMM Granulocytes #: 0.2 10*3/uL
IMM Granulocytes: 1.4 %
Lymph # K/uL: 1.3 10*3/uL (ref 1.2–3.7)
Lymphocyte %: 10.6 %
MCH: 29 pg/cell (ref 26–32)
MCHC: 32 g/dL (ref 32–36)
MCV: 91 fL (ref 79–95)
Mono # K/uL: 1 10*3/uL — ABNORMAL HIGH (ref 0.2–0.9)
Monocyte %: 7.7 %
Neut # K/uL: 9.7 10*3/uL — ABNORMAL HIGH (ref 1.6–6.1)
Nucl RBC # K/uL: 0 10*3/uL (ref 0.0–0.0)
Nucl RBC %: 0.2 /100 WBC (ref 0.0–0.2)
Platelets: 644 10*3/uL — ABNORMAL HIGH (ref 160–370)
RBC: 2.9 MIL/uL — ABNORMAL LOW (ref 3.9–5.2)
RDW: 18.5 % — ABNORMAL HIGH (ref 11.7–14.4)
Seg Neut %: 77.8 %
WBC: 12.5 10*3/uL — ABNORMAL HIGH (ref 4.0–10.0)

## 2017-04-29 LAB — POCT GLUCOSE
Glucose POCT: 113 mg/dL — ABNORMAL HIGH (ref 60–99)
Glucose POCT: 130 mg/dL — ABNORMAL HIGH (ref 60–99)
Glucose POCT: 131 mg/dL — ABNORMAL HIGH (ref 60–99)
Glucose POCT: 153 mg/dL — ABNORMAL HIGH (ref 60–99)

## 2017-04-29 LAB — MAGNESIUM: Magnesium: 2 mg/dL (ref 1.6–2.5)

## 2017-04-29 LAB — PROTIME-INR
INR: 1.2 — ABNORMAL HIGH (ref 0.9–1.1)
Protime: 13.7 s — ABNORMAL HIGH (ref 10.0–12.9)

## 2017-04-29 LAB — PHOSPHORUS: Phosphorus: 3.1 mg/dL (ref 2.7–4.5)

## 2017-04-29 LAB — APTT: aPTT: 24.1 s — ABNORMAL LOW (ref 25.8–37.9)

## 2017-04-29 MED ORDER — FAT EMULSION SOYBEAN OIL 20 % IV EMUL *WRAPPED*
60.0000 g | Freq: Every day | INTRAVENOUS | Status: DC
Start: 2017-04-29 — End: 2017-04-29
  Filled 2017-04-29: qty 500

## 2017-04-29 MED ORDER — STERILE WATER FOR INJECTION (TPN USE ONLY) *I*
INTRAVENOUS | Status: AC
Start: 2017-04-29 — End: 2017-04-30
  Filled 2017-04-29: qty 840

## 2017-04-29 MED ORDER — STERILE WATER FOR INJECTION (TPN USE ONLY) *I*
INTRAVENOUS | Status: DC
Start: 2017-04-29 — End: 2017-04-29
  Filled 2017-04-29: qty 840

## 2017-04-29 MED ORDER — LACTULOSE 20 GM/30ML PO SOLN *WRAPPED*
20.0000 g | Freq: Three times a day (TID) | ORAL | Status: AC
Start: 2017-04-29 — End: 2017-05-06
  Administered 2017-04-29 – 2017-05-06 (×14): 20 g via ORAL
  Filled 2017-04-29 (×21): qty 30

## 2017-04-29 MED ORDER — HYDROMORPHONE HCL 2 MG/ML IJ SOLN *WRAPPED*
1.0000 mg | INTRAMUSCULAR | Status: AC | PRN
Start: 2017-04-29 — End: 2017-05-24
  Administered 2017-04-29 – 2017-05-23 (×99): 1 mg via INTRAVENOUS
  Filled 2017-04-29 (×101): qty 1

## 2017-04-29 MED ORDER — FAT EMULSION SOYBEAN OIL 20 % IV EMUL *WRAPPED*
60.0000 g | Freq: Every day | INTRAVENOUS | Status: AC
Start: 2017-04-29 — End: 2017-04-30
  Administered 2017-04-29: 60 g via INTRAVENOUS
  Filled 2017-04-29: qty 300

## 2017-04-29 MED ORDER — SENNOSIDES 8.6 MG PO TABS *I*
1.0000 | ORAL_TABLET | Freq: Every evening | ORAL | Status: DC
Start: 2017-04-29 — End: 2017-05-01
  Administered 2017-04-29 – 2017-04-30 (×2): 1 via ORAL
  Filled 2017-04-29 (×2): qty 1

## 2017-04-29 MED ORDER — HYDROMORPHONE HCL 2 MG/ML IJ SOLN *WRAPPED*
0.5000 mg | INTRAMUSCULAR | Status: AC | PRN
Start: 2017-04-29 — End: 2017-05-24
  Administered 2017-04-30 – 2017-05-24 (×23): 0.5 mg via INTRAVENOUS
  Filled 2017-04-29 (×23): qty 1

## 2017-04-29 NOTE — Progress Notes (Signed)
Palliative Care Progress Note    HPI: [name] w/ PMH fibromyalgia, pancreatic CA s/p Whipple for 'cure' 03/29/17; s/p ICU w/ intub for resp failure & septic shock, weaned from pressors and extubated to HFNC 2/17 w/ IR drainage bili fluid collection 2/19, ID following & receiving IV abx (caspo ends 3/2). Palliative following for symptoms, started methadone 2/19, weaned dose 2/23 d/t concern for sedation. Recent KUB reveals full of stool & contrast dye, working on bowel clean out.    Subjective: "I have moderate pain, sometimes the pain meds (IV dilaudid) works but other times I'm watching the clock until I can get more. I'd say the pain is 6/10 and I'd take more pain medicine now if I could but I'm not due yet (freq Q 2 hrs prn)"    Objective: chart reviewed, visited pt w/ dtr [name] Investment banker, corporate) present, pt had been eval'd by ID and had increased pain after sitting forward to allow lungs to be ausculted; Probation officer reiviewed meds and strategies w/ pt to improve bowel clean out; pt not willing to have manual disimpaction, states she had small stool this AM and feels bloated and constipated still; willing to take lactulose and consider repeat enema (apparently only got 20 ml in before asking RN to stop) if needed; pt stoic about pain and endorsed not feeling up to ambulating or drinking/eating much d/t constipation and pain - will ask for increased freq of IV dilaudid prn as short acting pain med for now and once cleaned out of stool will re-eval IV dilaudid use & mental status if warrants increase in methadone    Palliative Care ROS:  Pain   Moderate  Nausea   None  Anorexia   Moderate  Anxiety   Mild  Shortness of Breath   Mild  Tiredness/Fatigue   Moderate  Drowsiness/Sleepiness   Moderate  Airway Secretions   yes  Constipation   stool this AM though still feels constipated  Unable to Respond   no  Delirium   no   Last stool 2/24    Physical Examination:   BP: (114-138)/(60-80)   Temp:  [36.2 C (97.2 F)-36.8 C (98.2 F)]    Temp src: Temporal (02/25 1140)  Heart Rate:  [82-100]   Resp:  [16-20]   SpO2:  [94 %-98 %]   Gen appearance: [name], sitting up in recliner, looks uncomfortable and endorses mod abd pain 6/10, not due yet for more pain med  Lungs: easy resp, dimin bases w/ crackles, encouraged pt to use IS  Heart: reg  Abd: large, soft, tender, +BS, Bili tube, stool this AM (still feels constipated)  Extrem:LE edema  Skin: pale, warm, dry  Neuro: A+O x 3, ambulates short distances w/ walker    Assessment/Plan: [name] w/ pancreatic CA, s/p Whipple in Jan. remains on TPN, now receiving IV abx s/p IR bili fluid drainage and recent KUB revealing need for bowel clean out of stool. Pt reports suboptimal pain control, using IV dilaudid prn fairly frequently warranting increased in availability while in process of resolving constipation then re-eval to determine if increase in methadone warranted.    Pain/dyspnea  Acetaminophen 1 gm po TID  Methadone 5 mg po TID  Dilaudid 1 mg IV Q 2 hrs prn (x 4 on 2/24, x 6 on 2/25) -increase freq to Q 1 hr prn  Dilaudid 0.5 mg IV Q 2 hrs prn (x 2 on 2/24)  Lidocaine patch daily  Encourage incentive spirometry prn  Anxiety/agitation/nausea  Amitriptyline 25 mg po QHS  Escitalopram 20 mg po QHS  Reglan 5 mg po 4x/d (AC & QHS)  Pantoprazole 40 mg po daily  Lorazepam 0.5 mg po Q 6 hrs prn    Prevention of constipation, last stool 2/25 (still feels constipated)  Colace 200 mg po BID  Lactulose 20 gm po TID  Miralax 17 gm po BID  Senna 1 tab po QHS  Pt may consider dulcolax supp or enema prn   Reviewed splinting pelvic floor muscles for additional support    GOC/prognosis  Full code  Dtr Rod Mae is HCP    Pt case discussed w/ RN and covering provider. We will continue to follow along, please call if questions/concerns.    Total Time Spent 35 minutes:   >50% of time was spent in counseling and/or coordination of care.     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    Hypothyroidism E03.9    Pancreatic cancer C25.9    Anemia D64.9     hx of Pulmonary embolism I26.99    Depression F32.9    Sepsis A41.9       No Known Allergies (drug, envir, food or latex)    Scheduled Meds:    senna  1 tablet Oral Nightly    lactulose  20 g Oral TID    metoclopramide  5 mg Oral 4x Daily AC & HS    docusate sodium  200 mg Oral 2 times per day    polyethylene glycol  17 g Oral 2 times per day    pantoprazole  40 mg Oral QAM    caspofungin  50 mg Intravenous Q24H    acetaminophen  1,000 mg Oral Q8H    methadone  5 mg Oral Q8H    heparin (porcine)  5,000 Units Subcutaneous Q8H    insulin regular  0-20 Units Subcutaneous Q6H    piperacillin-tazobactam  3.375 g Intravenous Q6H    levothyroxine  137 mcg Oral Daily    amitriptyline  25 mg Oral Nightly    escitalopram  20 mg Oral Nightly       Continuous Infusions:    TPN (2 in 1) Adult Continuous      And    fat emulsion soybean oil      TPN (2 in 1) Adult Continuous 75 mL/hr at 04/28/17 1810       PRN Meds:  HYDROmorphone PF **OR** HYDROmorphone PF, phenol, magnesium hydroxide, LORazepam, lidocaine, heparin lock flush, ondansetron

## 2017-04-29 NOTE — Progress Notes (Addendum)
Surgical Oncology/Hepatobiliary Surgery Progress Note     LOS: 31 days     Subjective:    No acute events overnight.  +flatus/+BM. Tolerating some food.  Denies pain or nausea  KUB this am with stool and contrast-filled colon  Pt had BMs x1 s/p enema last night    Objective:    Vitals Sign Ranges for Past 24 Hours:  BP: (114-138)/(60-80)   Temp:  [36 C (96.8 F)-36.7 C (98.1 F)]   Temp src: Temporal (02/25 0330)  Heart Rate:  [83-100]   Resp:  [16-20]   SpO2:  [95 %-98 %]       Physical Exam:    General Appearance: in NAD, appears catatonic  Cardiac: regular rate  Respiratory: non-labored breathing on room air  Abdomen: soft, mildly tender, pre-existing ecchymosis, biliary tube with bilious output  Extremities: warm    Labs:    CBC:    Recent Labs  Lab 04/29/17  0031 04/27/17  2330 04/27/17  0006 04/25/17  2338 04/25/17  0117 04/23/17  2343   WBC 12.5* 11.2* 12.0* 13.1* 16.5* 18.8*   Hemoglobin 8.4* 8.2* 8.4* 8.3* 8.9*   10.6* 8.9*   9.4*   Hematocrit 27* 25* 26* 26* 27* 27*   Platelets 644* 519* 486* 411* 383* 161       Metabolic Panel:    Recent Labs  Lab 04/29/17  0031 04/27/17  2330 04/27/17  0006 04/25/17  2338 04/25/17  0117 04/23/17  2343   Sodium 130* 131* 132* 134 132* 135   Potassium 4.7 4.2 4.0 4.0 3.9 3.7   Chloride 92* 96 98 100 99 101   CO2 '24 22 20 ' 19* 19* 19*   UN 26* 29* 27* 33* 36* 41*   Creatinine 0.67 0.67 0.73 0.72 0.77 0.92   Glucose 104* 102* 113* 112* 114* 125*   Calcium 8.6 8.4* 8.1* 7.9* 7.9* 8.1*   Magnesium 2.0 2.1 2.1 2.2 2.2 2.2   Phosphorus 3.1 3.2 2.9 3.0 2.8 3.7          Assessment:    72 y.o. female with h/o recent PE and pancreatic cancer POD # 31  status post whipple procedure complicated by delayed gastric emptying.  NGT replaced on 04/04/17. UGI on 04/12/17 concerning for obstruction just beyond the [place] in the efferent limb.  Course complicated on 0/96/04 by rising LFTs and sepsis with CT concerning for cholangitis given biliary tree obstruction and PV compression due to hematoma  and afferent limb distention in setting of PE treatment. Is s/p IR PTC on 04/16/17 and IR percutaneous aspiration of anterior abdominal fluid collection on 04/24/17.      Plan:    - Will discuss need for CT chest given known effusion and new report of pain at left lower chest/LUQ  - Continue regular diet, added ensure, calorie counts  - Continue TPN  - Aggressive bowel regimen  - IV caspofungin/Zosyn x 10 days (through 3/2)  with repeat CT abdomen/pelvis  - F/u repeat blood cultures  - Appreciate ID recommendations  - SCDs, heparin SC (hold therapeutic anticoagulation) for VTE ppx  - Dispo: pending clinical course      Gabriel Cirri, MD  04/29/2017     6:16 AM   General Surgery Resident      HPB-GI Attending Addendum:   I personally examined the patient, reviewed the notes, and discussed the plan of care with the residents and patient. Agree with detailed resident's note. Please see the note  above for details of history, exam, labs, assessment/plan which reflect my input.      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      Anorexia.   Hyperglycemia without need for insulin. Monitor.   Post operative anemia. No evidence of bleeding.  Volume overload. Self diuresis. Monitor fluid.   Pulmonary insufficiency improvement. Continue ISB  Bilateral pleural effusions with lung compression. Left greater than right. May require aspiration.   Pulmonary embolism. Present on admission. No evidence of worsening. Mild shortness of breath.  Hypoalbuminemia. Malnutrition. TPN. Encourage oral intake. Reglan started. Nutritional supplementation.  Somnolence. Improving.   Depression. Encouragement provided. Anti-depressant therapy.     Leukocytosis with slow improvement.   Anemia. Acute blood loss and chronic disease. Low iron intake. May benefit from iron supplementation but constipating.  Constipation with stool impaction.  Will work on aggressive stool regimen. Repeat KUB with some improvement of colonic stool burden.         Delano Metz, MD, FACS  Hepatobiliary, Pancreas & GI Surgery

## 2017-04-29 NOTE — Progress Notes (Signed)
ID Progress Note    Interval:  None acute. Afebrile since 2/11    Subjective:    Reports to be feeling the same, abdominal pain slightly better. Still anorexic. Denied f.c. Still on TPN    Objective:    Temp:  [36.4 C (97.5 F)-36.8 C (98.2 F)] 36.4 C (97.5 F)  Heart Rate:  [82-100] 95  Resp:  [16-20] 18  BP: (114-126)/(60-70) 120/64    PHYSICAL EXAM  No major change since prior  General: Sitting in chair, tired  appearing, cooperative  Lines: Left IJ CDI, Right chest Mediport de-accessed  HEENT: Atraumatic head, EOMI  Cardiovascular: Regular rate and rhythm, no murmurs  Lungs: Moderately SOB on room air, with >97% saturations, RR in 20s. Bibasilar crackles  Abdomen: Surgical site healing well, Firm but not acute, Right upper abdomen has drain in with clear bilious fluid, no other drains  GU: +foley  Neurologic: Grossly normal motor and sensory  Extremities: No cyanosis or edema bilaterally    Labs:      Recent Labs  Lab 04/29/17  0031 04/27/17  2330 04/27/17  0006 04/25/17  2338 04/25/17  0117 04/23/17  2343   WBC 12.5* 11.2* 12.0* 13.1* 16.5* 18.8*   Hemoglobin 8.4* 8.2* 8.4* 8.3* 8.9*   10.6* 8.9*   9.4*   Hematocrit 27* 25* 26* 26* 27* 27*   Platelets 644* 519* 486* 411* 383* 340   Seg Neut % 77.8 79.3 79.7 90.2 81.5 84.4   Bands %  --   --   --  _0 Recent Labs  Lab 04/29/17  0031 04/27/17  2330 04/27/17  0006   Sodium 130* 131* 132*   Potassium 4.7 4.2 4.0   Chloride 92* 96 98   CO2 _1 UN 26* 29* 27*   Creatinine 0.67 0.67 0.73   Glucose 104* 102* 113*   Calcium 8.6 8.4* 8.1*       Recent Labs  Lab 04/29/17  0031 04/27/17  2330 04/27/17  0006   Albumin 2.9* 2.8* 2.7*   Total Protein 7.2 6.8 6.6   ALT 27 31 33   AST _2 Alk Phos 331* 313* 333*     Recent Labs      04/29/17   0031  04/27/17   2330  04/27/17   0006   INR  1.2*  1.2*  1.2*       No results for input(s): ESR, CRP in the last 168 hours.     Recent Labs  Lab 04/25/17  0117 04/23/17  2343 04/22/17  2354   Lactate ART,WB  0.9* 1.1* 1.1*        Imaging:  No results found.        Microbiology:  1/25: Intraoperative bile : NTD    2/12: 1 set of blood cultures from the right Mediport were sent (no peripheral were sent): positive for Enterobacter cloacae complex in 70 hrs, sensitive to zosyn.     2/18 abdominal GS had >25 PMNs, many GPC in pairs/chains, many GNB and cultures 4+ Enterobacter cloacae complex. The cultures also grew 1+ Candida glabrata     2/21: BCx from the Right peripheral IV, mediport, L IJ are NTD      Assessment:  Enterobacter cloacae complex bacteremia and abdominal abscess, and Candida glabrata abdominal abscess in the setting of obstructive cholangitis due to the hematoma in a surgical bed in a patient  with recent Whipple surgery on 5/45/62, with complicated postop course due to GOO also due to the hematoma and added risk factor of being on TPN. The Candida is considered a true pathogen given the recent surgery, septic shock, clearly implicating GI source and being on TPN.       RECOMMENDATIONS:  - Continue caspofungin, zosyn per the recommended plan of 10 days course from 04/23/17 (date of abscess drainage). Obtain repeat CT A/P at the end of the course to assess size of the abscess.   - F/u cultures      ID will follow     Patient seen and examined with ID attending, Dr Clide Cliff           Page with Qs.      Rosina Lowenstein, MD  Infectious Diseases fellow, PGY-4  Pager [phone]

## 2017-04-29 NOTE — Progress Notes (Addendum)
04/29/17 1000   [facility] Patient Class Review   Patient Class Review Inpatient   Patient Class Effective 03/29/2017  Earnest Conroy RN   Utilization  Management  X [phone]  Page [phone]

## 2017-04-29 NOTE — Plan of Care (Signed)
Bowel Elimination     Elimination patterns are normal or improving Maintaining        Cognitive function     Cognitive function will be maintained Maintaining        Mobility     Functional status is maintained or improved - Geriatric Maintaining        Nutrition     Patient's nutritional status is maintained or improved Maintaining        Pain/Comfort     Patient's pain or discomfort is manageable Maintaining        Post-Operative Bowel Elimination     Elimination pattern is normal or improving Maintaining        Post-Operative Complications     Patient will remain free from symptoms of infection-post op Maintaining        Post-Operative Hemodynamic Stability     Maintain Hemodynamic Stability Maintaining        Safety     Patient will remain free of falls Maintaining

## 2017-04-29 NOTE — Consults (Signed)
Medical Nutrition Therapy Brief Note: TPN check    Patient remains on TPN for nutrition support. Chart, labs, medications / TPN order, I/Os reviewed. Started on Reglan. Na 130 (L), K+ 4.7 (WNL), Cl 92 (L), CO2 24 (WNL), Ca 8.6 (WNL), PO4 3.1 (WNL), Mg 2.0 (WNL). Recent t bili 0.5 (WNL) from 2/25. Recent TGs 241 (H) from 2/23.  BGs ranging from 104 - 153 over the past 24 hours.  MAR reviewed. I/Os significant for 750 ml biliary tube output, BM x2. Current TPN order reviewed - please see recommendations below for changes.    Recommendations:  1.  Continue 2-in-1 TPN with lipids, via adult central line            Rate @ 75 ml/hr x 24 hrs (1800 ml fluid per day)            Amino acids 70 g/L            Dextrose 150 g/L            Na+ 130 meq/L             K+ 30 meq/L            Ca++ 10 meq/L             Mg 5 meq/L            Phos 12 mmol/L             Standard MVI            Standard trace elements            ADD: 200 mg levocarnitine           max Cl  AND: 60 g/day lipids (300 ml/day 20% soy lipid emulsion, given over 12 hours)  - TPN with lipids will provide a total of 2100 ml fluid, 2022 kcal, 126 g protein and 270 g dextrose/day.   2.  Continue regular diet with Ensure 1.0 TID  3.  Continue to monitor BMP, Mg and PO4. Replete as needed   Will continue to monitor.     Laurence Aly, [place]  Pager [phone]

## 2017-04-30 LAB — CBC AND DIFFERENTIAL
Baso # K/uL: 0.1 10*3/uL (ref 0.0–0.1)
Basophil %: 0.5 %
Eos # K/uL: 0.2 10*3/uL (ref 0.0–0.4)
Eosinophil %: 1.7 %
Hematocrit: 24 % — ABNORMAL LOW (ref 34–45)
Hemoglobin: 7.9 g/dL — ABNORMAL LOW (ref 11.2–15.7)
IMM Granulocytes #: 0.2 10*3/uL
IMM Granulocytes: 1.5 %
Lymph # K/uL: 1.1 10*3/uL — ABNORMAL LOW (ref 1.2–3.7)
Lymphocyte %: 8.9 %
MCH: 30 pg/cell (ref 26–32)
MCHC: 33 g/dL (ref 32–36)
MCV: 92 fL (ref 79–95)
Mono # K/uL: 0.9 10*3/uL (ref 0.2–0.9)
Monocyte %: 7.4 %
Neut # K/uL: 10.1 10*3/uL — ABNORMAL HIGH (ref 1.6–6.1)
Nucl RBC # K/uL: 0 10*3/uL (ref 0.0–0.0)
Nucl RBC %: 0 /100 WBC (ref 0.0–0.2)
Platelets: 596 10*3/uL — ABNORMAL HIGH (ref 160–370)
RBC: 2.6 MIL/uL — ABNORMAL LOW (ref 3.9–5.2)
RDW: 18.8 % — ABNORMAL HIGH (ref 11.7–14.4)
Seg Neut %: 80 %
WBC: 12.6 10*3/uL — ABNORMAL HIGH (ref 4.0–10.0)

## 2017-04-30 LAB — COMPREHENSIVE METABOLIC PANEL
ALT: 23 U/L (ref 0–35)
AST: 23 U/L (ref 0–35)
Albumin: 2.7 g/dL — ABNORMAL LOW (ref 3.5–5.2)
Alk Phos: 286 U/L — ABNORMAL HIGH (ref 35–105)
Anion Gap: 13 (ref 7–16)
Bilirubin,Total: 0.4 mg/dL (ref 0.0–1.2)
CO2: 23 mmol/L (ref 20–28)
Calcium: 8.3 mg/dL — ABNORMAL LOW (ref 8.6–10.2)
Chloride: 93 mmol/L — ABNORMAL LOW (ref 96–108)
Creatinine: 0.69 mg/dL (ref 0.51–0.95)
GFR,Black: 101 *
GFR,Caucasian: 88 *
Glucose: 108 mg/dL — ABNORMAL HIGH (ref 60–99)
Lab: 26 mg/dL — ABNORMAL HIGH (ref 6–20)
Potassium: 4.4 mmol/L (ref 3.3–5.1)
Sodium: 129 mmol/L — ABNORMAL LOW (ref 133–145)
Total Protein: 6.8 g/dL (ref 6.3–7.7)

## 2017-04-30 LAB — POCT GLUCOSE
Glucose POCT: 120 mg/dL — ABNORMAL HIGH (ref 60–99)
Glucose POCT: 122 mg/dL — ABNORMAL HIGH (ref 60–99)
Glucose POCT: 125 mg/dL — ABNORMAL HIGH (ref 60–99)
Glucose POCT: 126 mg/dL — ABNORMAL HIGH (ref 60–99)

## 2017-04-30 LAB — APTT: aPTT: 29.1 s (ref 25.8–37.9)

## 2017-04-30 LAB — FERRITIN: Ferritin: 411 ng/mL — ABNORMAL HIGH (ref 10–120)

## 2017-04-30 LAB — TIBC
Iron: 13 ug/dL — ABNORMAL LOW (ref 34–165)
TIBC: 330 ug/dL (ref 250–450)
Transferrin Saturation: 4 % — ABNORMAL LOW (ref 15–50)

## 2017-04-30 LAB — TRANSFERRIN: Transferrin: 219 mg/dL (ref 200–360)

## 2017-04-30 LAB — PHOSPHORUS: Phosphorus: 3.3 mg/dL (ref 2.7–4.5)

## 2017-04-30 LAB — MAGNESIUM: Magnesium: 1.9 mg/dL (ref 1.6–2.5)

## 2017-04-30 LAB — TRIGLYCERIDES: Triglycerides: 156 mg/dL — AB

## 2017-04-30 LAB — PROTIME-INR
INR: 1.3 — ABNORMAL HIGH (ref 0.9–1.1)
Protime: 14.4 s — ABNORMAL HIGH (ref 10.0–12.9)

## 2017-04-30 MED ORDER — FAT EMULSION SOYBEAN OIL 20 % IV EMUL *WRAPPED*
60.0000 g | Freq: Every day | INTRAVENOUS | Status: AC
Start: 2017-04-30 — End: 2017-05-01
  Administered 2017-04-30: 60 g via INTRAVENOUS
  Filled 2017-04-30: qty 300

## 2017-04-30 MED ORDER — METHYLNALTREXONE BROMIDE 12 MG/0.6ML SC SOLN *I*
12.0000 mg | Freq: Once | SUBCUTANEOUS | Status: AC
Start: 2017-04-30 — End: 2017-04-30
  Administered 2017-04-30: 12 mg via SUBCUTANEOUS
  Filled 2017-04-30: qty 0.6

## 2017-04-30 MED ORDER — MINERAL OIL PO OIL *WRAPPED*
15.0000 mL | TOPICAL_OIL | Freq: Once | ORAL | Status: AC
Start: 2017-04-30 — End: 2017-04-30
  Administered 2017-04-30: 15 mL via ORAL
  Filled 2017-04-30: qty 15

## 2017-04-30 MED ORDER — FERROUS GLUCONATE 324 (37.5 FE) MG PO TABS *WRAPPED*
324.0000 mg | ORAL_TABLET | Freq: Every day | ORAL | Status: DC
Start: 2017-04-30 — End: 2017-05-13
  Administered 2017-04-30 – 2017-05-13 (×14): 324 mg via ORAL
  Filled 2017-04-30 (×15): qty 1

## 2017-04-30 MED ORDER — STERILE WATER FOR INJECTION (TPN USE ONLY) *I*
INTRAVENOUS | Status: AC
Start: 2017-04-30 — End: 2017-05-01
  Filled 2017-04-30: qty 840

## 2017-04-30 MED ORDER — CITRATE OF MAGNESIA PO SOLN *I*
150.0000 mL | Freq: Once | ORAL | Status: AC
Start: 2017-04-30 — End: 2017-04-30
  Administered 2017-04-30: 150 mL via ORAL

## 2017-04-30 MED ORDER — VITAMIN C 500 MG PO TABS *I*
500.0000 mg | ORAL_TABLET | Freq: Every day | ORAL | Status: AC
Start: 2017-04-30 — End: 2017-05-07
  Administered 2017-04-30 – 2017-05-06 (×6): 500 mg via ORAL
  Filled 2017-04-30 (×8): qty 1

## 2017-04-30 NOTE — Consults (Signed)
Medical Nutrition Therapy Brief Note: TPN check    Patient remains on TPN for nutrition support. Chart, labs, medications / TPN order, I/Os reviewed. Na 129 (L), K+ 4.4 (WNL), Cl 93 (L), CO2 23 (WNL), Ca 8.3 (L), PO4 3.3 (WNL), Mg 1.9 (WNL). Recent t bili 0.4 (WNL) from 2/26. BGs ranging from 108 - 130 over the past 24 hours, 12 units insulin given.  Fe 13 (L). MAR reviewed with repletions noted for Mg citrate. I/Os significant for 540 ml PO, 975 ml biliary tube output. Current TPN order reviewed - no changes to TPN order needed at this time.    Recommendations:  1.  Continue2-in-1 TPN with lipids, via adult central line  Rate@ 75 ml/hr x 24 hrs (1800 ml fluid per day)  Amino acids 70 g/L  Dextrose 150 g/L  Na+ 130 meq/L   K+ 30 meq/L  Ca++ 10 meq/L   Mg 5 meq/L  Phos 12 mmol/L   Standard MVI  Standard trace elements  ADD: 200 mg levocarnitine  max Cl  AND: 60 g/day lipids (300 ml/day 20% soy lipid emulsion, given over 12 hours)  - TPN with lipids will provide a total of 2100 ml fluid, 2022 kcal, 126 g protein and 270 g dextrose/day.   2.  Continue regular diet with Ensure 1.0 TID  3.  Continue to monitor BMP, Mg and PO4. Replete as needed   4.  Recommend ferrous gluconate for iron supplementation. Take with 500 mg vitamin c to increase absorption.       Will continue to monitor.     Laurence Aly, [place]  Pager [phone]

## 2017-04-30 NOTE — Plan of Care (Signed)
Problem: Safety  Goal: Patient will remain free of falls  Outcome: Maintaining      Problem: Mobility  Goal: Functional status is maintained or improved - Geriatric  Outcome: Maintaining      Problem: Cognitive function  Goal: Cognitive function will be maintained  Outcome: Maintaining      Problem: Bowel Elimination  Goal: Elimination patterns are normal or improving  Outcome: Maintaining      Problem: Post-Operative Complications  Goal: Patient will remain free from symptoms of infection-post op  Outcome: Maintaining      Problem: Post-Operative Bowel Elimination  Goal: Elimination pattern is normal or improving  Outcome: Maintaining

## 2017-04-30 NOTE — Plan of Care (Signed)
Bowel Elimination     Elimination patterns are normal or improving Maintaining        Cognitive function     Cognitive function will be maintained Maintaining        Mobility     Functional status is maintained or improved - Geriatric Maintaining        Nutrition     Patient's nutritional status is maintained or improved Maintaining        Pain/Comfort     Patient's pain or discomfort is manageable Maintaining        Post-Operative Bowel Elimination     Elimination pattern is normal or improving Maintaining        Post-Operative Complications     Patient will remain free from symptoms of infection-post op Maintaining        Post-Operative Hemodynamic Stability     Maintain Hemodynamic Stability Maintaining        Safety     Patient will remain free of falls Maintaining

## 2017-04-30 NOTE — Plan of Care (Signed)
Chart reviewed.      ID will now sign-off. Recall if needed if the repeat CT A/P (to be done after the planned 10 day of Abx from 04/23/17 (ie on 05/04/17) shows re-accumulation of abscess, in order to decide further course of Abx.     Discussed with Dr. Clide Cliff.         Rosina Lowenstein, MD  Infectious Diseases fellow, PGY-4  Pager [phone]

## 2017-04-30 NOTE — Progress Notes (Addendum)
Surgical Oncology/Hepatobiliary Surgery Progress Note     LOS: 32 days     Subjective:    No acute events overnight.  +flatus. Tolerating some food - taking some ensure but "afraid of having an upset stomach".  Denies pain or nausea  On lactulose TID with daily enemas. Denies BMs    Objective:    Vitals Sign Ranges for Past 24 Hours:  BP: (120-140)/(64-80)   Temp:  [36.4 C (97.5 F)-37.1 C (98.8 F)]   Temp src: Temporal (02/26 0336)  Heart Rate:  [82-102]   Resp:  [16-18]   SpO2:  [94 %-96 %]       Physical Exam:    General Appearance: in NAD, appears catatonic  Cardiac: regular rate  Respiratory: non-labored breathing on room air  Abdomen: soft, mildly tender, distended, pre-existing ecchymosis, biliary tube with bilious output  Extremities: warm    Labs:    CBC:    Recent Labs  Lab 04/30/17  0034 04/29/17  0031 04/27/17  2330 04/27/17  0006 04/25/17  2338 04/25/17  0117   WBC 12.6* 12.5* 11.2* 12.0* 13.1* 16.5*   Hemoglobin 7.9* 8.4* 8.2* 8.4* 8.3* 8.9*   10.6*   Hematocrit 24* 27* 25* 26* 26* 27*   Platelets 596* 644* 519* 486* 411* 626*       Metabolic Panel:    Recent Labs  Lab 04/30/17  0034 04/29/17  0031 04/27/17  2330 04/27/17  0006 04/25/17  2338 04/25/17  0117   Sodium 129* 130* 131* 132* 134 132*   Potassium 4.4 4.7 4.2 4.0 4.0 3.9   Chloride 93* 92* 96 98 100 99   CO2 _0 19* 19*   UN 26* 26* 29* 27* 33* 36*   Creatinine 0.69 0.67 0.67 0.73 0.72 0.77   Glucose 108* 104* 102* 113* 112* 114*   Calcium 8.3* 8.6 8.4* 8.1* 7.9* 7.9*   Magnesium 1.9 2.0 2.1 2.1 2.2 2.2   Phosphorus 3.3 3.1 3.2 2.9 3.0 2.8          Assessment:    72 y.o. female with h/o recent PE and pancreatic cancer POD # 32  status post whipple procedure complicated by delayed gastric emptying.  NGT replaced on 04/04/17. UGI on 04/12/17 concerning for obstruction just beyond the [place] in the efferent limb.  Course complicated on 9/48/54 by rising LFTs and sepsis with CT concerning for cholangitis given biliary tree obstruction and PV  compression due to hematoma and afferent limb distention in setting of PE treatment. Is s/p IR PTC on 04/16/17 and IR percutaneous aspiration of anterior abdominal fluid collection on 04/24/17.      Plan:    - Will discuss need for CT chest given known effusion and new report of pain at left lower chest/LUQ  - Continue regular diet, added ensure, calorie counts  - Continue TPN  - Aggressive bowel regimen  - IV caspofungin/Zosyn x 10 days (through 3/2)  with repeat CT abdomen/pelvis  - F/u repeat blood cultures  - Appreciate ID recommendations  - SCDs, heparin SC (hold therapeutic anticoagulation) for VTE ppx  - Dispo: pending clinical course      Gabriel Cirri, MD  04/30/2017     6:08 AM   General Surgery Resident      HPB-GI Attending Addendum:   I personally examined the patient, reviewed the notes, and discussed the plan of care with the residents and patient. Agree with detailed resident's note. Please see the note  above for details of history, exam, labs, assessment/plan which reflect my input.      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      Anorexia.   Hyperglycemia without need for insulin. Monitor.   Post operative anemia. No evidence of bleeding. Will need iron but constipation, will hold for now.   Volume overload. Self diuresis. Monitor fluid.   Bilateral pleural effusions with lung compression. Left greater than right. May require aspiration. Monitor for now.  Pulmonary embolism. Present on admission. No evidence of worsening. Will hold anticoagulation for now given recent bleeding.  Hypoalbuminemia. Malnutrition. TPN. Encourage oral intake. Reglan started. Nutritional supplementation.  Somnolence. Improved.  Depression. Encouragement provided. Anti-depressant therapy.     Leukocytosis improvement. Continue antibiotics for bacteremia for 14 day course.   Constipation with stool impaction.  Increasing efforts. Disimpaction performed by team and me today.       Delano Metz, MD, FACS  Hepatobiliary, Pancreas & GI Surgery

## 2017-05-01 LAB — BLOOD CULTURE
Bacterial Blood Culture: 0
Bacterial Blood Culture: 0
Bacterial Blood Culture: 0
Bacterial Blood Culture: 0
Bacterial Blood Culture: 0

## 2017-05-01 LAB — CBC AND DIFFERENTIAL
Baso # K/uL: 0.1 10*3/uL (ref 0.0–0.1)
Basophil %: 0.6 %
Eos # K/uL: 0.3 10*3/uL (ref 0.0–0.4)
Eosinophil %: 2.4 %
Hematocrit: 23 % — ABNORMAL LOW (ref 34–45)
Hemoglobin: 7.5 g/dL — ABNORMAL LOW (ref 11.2–15.7)
IMM Granulocytes #: 0.2 10*3/uL
IMM Granulocytes: 2 %
Lymph # K/uL: 0.9 10*3/uL — ABNORMAL LOW (ref 1.2–3.7)
Lymphocyte %: 8.1 %
MCH: 29 pg/cell (ref 26–32)
MCHC: 32 g/dL (ref 32–36)
MCV: 91 fL (ref 79–95)
Mono # K/uL: 0.8 10*3/uL (ref 0.2–0.9)
Monocyte %: 6.6 %
Neut # K/uL: 9.1 10*3/uL — ABNORMAL HIGH (ref 1.6–6.1)
Nucl RBC # K/uL: 0 10*3/uL (ref 0.0–0.0)
Nucl RBC %: 0.1 /100 WBC (ref 0.0–0.2)
Platelets: 611 10*3/uL — ABNORMAL HIGH (ref 160–370)
RBC: 2.6 MIL/uL — ABNORMAL LOW (ref 3.9–5.2)
RDW: 18.6 % — ABNORMAL HIGH (ref 11.7–14.4)
Seg Neut %: 80.3 %
WBC: 11.4 10*3/uL — ABNORMAL HIGH (ref 4.0–10.0)

## 2017-05-01 LAB — COMPREHENSIVE METABOLIC PANEL
ALT: 25 U/L (ref 0–35)
AST: 28 U/L (ref 0–35)
Albumin: 2.5 g/dL — ABNORMAL LOW (ref 3.5–5.2)
Alk Phos: 284 U/L — ABNORMAL HIGH (ref 35–105)
Anion Gap: 13 (ref 7–16)
Bilirubin,Total: 0.5 mg/dL (ref 0.0–1.2)
CO2: 20 mmol/L (ref 20–28)
Calcium: 8 mg/dL — ABNORMAL LOW (ref 8.6–10.2)
Chloride: 95 mmol/L — ABNORMAL LOW (ref 96–108)
Creatinine: 0.64 mg/dL (ref 0.51–0.95)
GFR,Black: 104 *
GFR,Caucasian: 90 *
Glucose: 127 mg/dL — ABNORMAL HIGH (ref 60–99)
Lab: 24 mg/dL — ABNORMAL HIGH (ref 6–20)
Potassium: 4.2 mmol/L (ref 3.3–5.1)
Sodium: 128 mmol/L — ABNORMAL LOW (ref 133–145)
Total Protein: 6.6 g/dL (ref 6.3–7.7)

## 2017-05-01 LAB — PROTIME-INR
INR: 1.3 — ABNORMAL HIGH (ref 0.9–1.1)
Protime: 14.3 s — ABNORMAL HIGH (ref 10.0–12.9)

## 2017-05-01 LAB — MAGNESIUM: Magnesium: 2 mg/dL (ref 1.6–2.5)

## 2017-05-01 LAB — POCT GLUCOSE
Glucose POCT: 114 mg/dL — ABNORMAL HIGH (ref 60–99)
Glucose POCT: 116 mg/dL — ABNORMAL HIGH (ref 60–99)
Glucose POCT: 129 mg/dL — ABNORMAL HIGH (ref 60–99)
Glucose POCT: 136 mg/dL — ABNORMAL HIGH (ref 60–99)

## 2017-05-01 LAB — PHOSPHORUS: Phosphorus: 3.5 mg/dL (ref 2.7–4.5)

## 2017-05-01 LAB — APTT: aPTT: 27.5 s (ref 25.8–37.9)

## 2017-05-01 MED ORDER — STERILE WATER FOR INJECTION (TPN USE ONLY) *I*
INTRAVENOUS | Status: AC
Start: 2017-05-01 — End: 2017-05-02
  Filled 2017-05-01: qty 840

## 2017-05-01 MED ORDER — SENNOSIDES 8.6 MG PO TABS *I*
1.0000 | ORAL_TABLET | Freq: Two times a day (BID) | ORAL | Status: DC
Start: 2017-05-01 — End: 2017-05-10
  Administered 2017-05-01 – 2017-05-10 (×15): 1 via ORAL
  Filled 2017-05-01 (×16): qty 1

## 2017-05-01 MED ORDER — FAT EMULSION SOYBEAN OIL 20 % IV EMUL *WRAPPED*
60.0000 g | Freq: Every day | INTRAVENOUS | Status: AC
Start: 2017-05-01 — End: 2017-05-02
  Administered 2017-05-01: 60 g via INTRAVENOUS
  Filled 2017-05-01: qty 300

## 2017-05-01 MED ORDER — METHYLNALTREXONE BROMIDE 12 MG/0.6ML SC SOLN *I*
12.0000 mg | SUBCUTANEOUS | Status: DC
Start: 2017-05-01 — End: 2017-05-15
  Administered 2017-05-01 – 2017-05-15 (×8): 12 mg via SUBCUTANEOUS
  Filled 2017-05-01 (×8): qty 0.6

## 2017-05-01 NOTE — Progress Notes (Signed)
Assumed care of pt at 0700 . VSS .  Pt up this morning oob to chair , c 2 person  Assist .  Pt up for a couple  Of hours .  Pt back to bed , refused to ambulate , stating she was to tired .  Telling Probation officer  She did ''not sleep last night''. Pt weak , deconditioned .  Writer    Reinforced the importance of trying to ambulate when she  is able .  Pt voiced understanding .  Pt continues to receive   TPN , has very little appetite .   Pt medicated c methadone as scheduled , also received iv dilaudid as   Ordered .  Pt  Reporting pain 4-10 after . Pt incontinent of both urine and stool .   Pt now receiving Methynaltrexone 12 mg every other day  As well as  Additional bowel medications . Pt spent the rest of the afternoon  Sleeping on ad off , was more conversant Scientist, product/process development when  daughter arrived .  Pt currently resting in bed , call bell in reach side rails up 2/4. Telemetry   Remains,  reading NSR.  Report given to oncoming shift .

## 2017-05-01 NOTE — Progress Notes (Signed)
Palliative Care Progress Note    HPI: [name] w/ PMH fibromyalgia, pancreatic CA s/p Whipple for 'cure' 03/29/17; s/p ICU w/ intub for resp failure & septic shock, weaned from pressors and extubated to HFNC 2/17 w/ IR drainage bili fluid collection 2/19, ID following & receiving IV abx (caspo ends 3/2). Palliative following for symptoms, started methadone 2/19, weaned dose 2/23 d/t concern for sedation. Recent KUB reveals full of stool & contrast dye, pt having large stools s/p enema & methylnaltrexone, still w/ intermittent uncontrolled abd pain -may consider increasing methadone in coming days.    Subjective: "I took pain medicine but it didn't help, the pain is right across my abdomen. I had a large stool last evening and just went again with urination."    Objective: chart reviewed, visited pt w/ dtr [name] Investment banker, corporate) present, pt had received only 2 doses prn dilaudid today and had just received the lower 0.5 mg IV dilaudid dose ~ 1 hr prior but was in obvious pain w/ facial grimacing, rating abd pain 8/10; pt agreeable to take additional 0.5 mg dilaudid and writer reiterated that she could get pain meds Q 1 hr prn and had 2 dose options of 0.5 or 1 mg; pt reports she's been emptying her bladder (s/p bladder tuck a few years ago), spoke w/ bedside RN to consider bladder scan if pt still not getting relief from pain    Palliative Care ROS:  Pain   Severe  Nausea   None  Anorexia   Moderate  Anxiety   Moderate  Shortness of Breath   Mild  Tiredness/Fatigue   Mild  Drowsiness/Sleepiness   Mild  Airway Secretions   no  Constipation   stooling much more s/p enema though still feels bloated and constipated  Unable to Respond   no  Delirium   no   Last stool 2/27    Physical Examination:   BP: (102-132)/(64-90)   Temp:  [35.9 C (96.6 F)-36.6 C (97.9 F)]   Temp src: Temporal (02/27 1608)  Heart Rate:  [86-104]   Resp:  [16-20]   SpO2:  [94 %-97 %]   Gen appearance: [name], laying in bed, looks very  uncomfortable and endorses severe abd pain 8/10 an hour after receiving 0.5 mg IV dilaudid pain med  Lungs: easy resp, clear, dimin bases and seems guarded avoiding deep breaths, encouraged pt to use IS  Heart: reg  Abd: large, slightly tense, tender, +BS, Bili tube, stooling more today (still feels bloated & constipated)  Extrem: LE edema  Skin: pale, warm, dry  Neuro: A+O x 3, ambulates short distances w/ walker but currently guarded, avoiding movement d/t pain    Assessment/Plan: [name] w/ pancreatic CA, s/p Whipple in Jan. remains on TPN, now receiving IV abx s/p IR bili fluid drainage and recent KUB revealing need for bowel clean out of stool. Pt reports suboptimal pain control, has not been using IV dilaudid prn frequently - needed reiteration of prn freq Q 1 hr and different doses available. Pt stooling more and still hopeful resolving constipation will improve overall pain management. Will monitor for now and possibly consider increasing methadone if still reporting uncontrolled pain.      Pain/dyspnea  Acetaminophen 1 gm po TID  Methadone 5 mg po TID (dose weaned 2/23, may consider increasing dose if pt still w/ uncontrolled pain, encouraged her to ask for PRN dilaudid to better determine what her opioid needs are)  Dilaudid 0.5 mg  IV Q 1 hr prn (x 1 on 2/26, x 3 on 2/27)    Dilaudid 1 mg IV Q 1 hr prn (x 3 on 2/26)  Lidocaine patch daily  Encourage incentive spirometry prn     Anxiety/agitation/nausea  Amitriptyline 25 mg po QHS  Escitalopram 20 mg po QHS  Reglan 5 mg po 4x/d (AC & QHS)  Pantoprazole 40 mg po daily  Lorazepam 0.5 mg po Q 6 hrs prn (x 1 on 2/26)  Ondansetron 4 mg IV Q 6 hrs prn (last x 1 on 2/18)    Prevention of constipation, last stool 2/27 (still feels constipated)  Colace 200 mg po BID  Lactulose 20 gm po TID  Miralax 17 gm po BID  Senna 1 tab po BID  Methylnaltrexone 12 mg SC every other day (started 2/26)  Pt may consider dulcolax supp or enema prn   Reviewed splinting pelvic floor  muscles for additional support    Prevention of urinary retention  Consider bladder scan    GOC/prognosis  Full code  Dtr Rod Mae is HCP    Pt case discussed w/ RN. We will continue to follow along, please call if questions/concerns.    Total Time Spent 35 minutes:   >50% of time was spent in counseling and/or coordination of care.     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    Hypothyroidism E03.9    Pancreatic cancer C25.9    Anemia D64.9     hx of Pulmonary embolism I26.99    Depression F32.9    Sepsis A41.9       No Known Allergies (drug, envir, food or latex)    Scheduled Meds:    methylnaltrexone  12 mg Subcutaneous Every Other Day    senna  1 tablet Oral 2 times per day    ferrous gluconate  324 mg Oral Daily with breakfast    ascorbic acid  500 mg Oral Daily with breakfast    lactulose  20 g Oral TID    metoclopramide  5 mg Oral 4x Daily AC & HS    docusate sodium  200 mg Oral 2 times per day    polyethylene glycol  17 g Oral 2 times per day    pantoprazole  40 mg Oral QAM    caspofungin  50 mg Intravenous Q24H    acetaminophen  1,000 mg Oral Q8H    methadone  5 mg Oral Q8H    heparin (porcine)  5,000 Units Subcutaneous Q8H    insulin regular  0-20 Units Subcutaneous Q6H    piperacillin-tazobactam  3.375 g Intravenous Q6H    levothyroxine  137 mcg Oral Daily    amitriptyline  25 mg Oral Nightly    escitalopram  20 mg Oral Nightly       Continuous Infusions:    TPN (2 in 1) Adult Continuous      And    fat emulsion soybean oil      TPN (2 in 1) Adult Continuous 75 mL/hr at 05/01/17 0659       PRN Meds:  HYDROmorphone PF **OR** HYDROmorphone PF, phenol, LORazepam, heparin lock flush, ondansetron

## 2017-05-01 NOTE — Consults (Signed)
Medical Nutrition Therapy Brief Note: TPN check    Patient remains on TPN for nutrition support. Chart, labs, medications / TPN order, I/Os reviewed. Holding iron due to constipation. Na 128 (L), K+ 4.2 (WNL), Cl 95 (L), CO2 29 (WNL), Ca 9.2 (WNL corrected for low albumin 2.5), PO4 3.5 (WNL), Mg 2.0 (WNL). Recent t bili 0.5 (WNL) from 2/27. Recent TGs 156 (WNL) from 2/26.  BGs ranging from 116 - 129 over the past 24 hours- 12 units insulin coverage.  MAR reviewed with repletions noted for Mg citrate. I/Os significant for -175 ml biliary tube output. Current TPN order reviewed - no changes to TPN order needed at this time.    Recommendations:  1.  Continue2-in-1 TPN with lipids, via adult central line  Rate@ 75 ml/hr x 24 hrs (1800 ml fluid per day)  Amino acids 70 g/L  Dextrose 150 g/L  Na+ 130 meq/L   K+ 30 meq/L  Ca++ 10 meq/L   Mg 5 meq/L  Phos 12 mmol/L   Standard MVI  Standard trace elements  ADD: 200 mg levocarnitine  max Cl  AND: 60 g/day lipids (300 ml/day 20% soy lipid emulsion, given over 12 hours)  - TPN with lipids will provide a total of 2100 ml fluid, 2022 kcal, 126 g protein and 270 g dextrose/day.   2. Continue regular diet with Ensure 1.0 TID  3. Continue to monitor BMP, Mg and PO4. Replete as needed     Will continue to monitor.     Laurence Aly, [place]  Pager [phone]

## 2017-05-01 NOTE — Progress Notes (Addendum)
Surgical Oncology/Hepatobiliary Surgery Progress Note     LOS: 33 days     Subjective:    No acute events overnight.  +flatus. Tolerating some small amount of food  Denies pain or nausea  S/p enema yesterday - states she had two large BMs     Objective:    Vitals Sign Ranges for Past 24 Hours:  BP: (100-132)/(60-90)   Temp:  [35.9 C (96.6 F)-36.7 C (98.1 F)]   Temp src: Temporal (02/27 0342)  Heart Rate:  [86-104]   Resp:  [16-17]   SpO2:  [95 %-97 %]       Physical Exam:    General Appearance: in NAD, speaking slowly/quietly  Cardiac: regular rate  Respiratory: non-labored breathing on room air  Abdomen: soft, mildly tender, distended, pre-existing ecchymosis, biliary tube with bilious output  Extremities: warm    Labs:    CBC:    Recent Labs  Lab 05/01/17  0017 04/30/17  0034 04/29/17  0031 04/27/17  2330 04/27/17  0006 04/25/17  2338   WBC 11.4* 12.6* 12.5* 11.2* 12.0* 13.1*   Hemoglobin 7.5* 7.9* 8.4* 8.2* 8.4* 8.3*   Hematocrit 23* 24* 27* 25* 26* 26*   Platelets 611* 596* 644* 519* 486* 161*       Metabolic Panel:    Recent Labs  Lab 05/01/17  0017 04/30/17  0034 04/29/17  0031 04/27/17  2330 04/27/17  0006 04/25/17  2338   Sodium 128* 129* 130* 131* 132* 134   Potassium 4.2 4.4 4.7 4.2 4.0 4.0   Chloride 95* 93* 92* 96 98 100   CO2 '20 23 24 22 20 ' 19*   UN 24* 26* 26* 29* 27* 33*   Creatinine 0.64 0.69 0.67 0.67 0.73 0.72   Glucose 127* 108* 104* 102* 113* 112*   Calcium 8.0* 8.3* 8.6 8.4* 8.1* 7.9*   Magnesium 2.0 1.9 2.0 2.1 2.1 2.2   Phosphorus 3.5 3.3 3.1 3.2 2.9 3.0          Assessment:    72 y.o. female with h/o recent PE and pancreatic cancer POD # 33  status post whipple procedure complicated by delayed gastric emptying.  NGT replaced on 04/04/17. UGI on 04/12/17 concerning for obstruction just beyond the [place] in the efferent limb.  Course complicated on 0/96/04 by rising LFTs and sepsis with CT concerning for cholangitis given biliary tree obstruction and PV compression due to hematoma and afferent  limb distention in setting of PE treatment. Is s/p IR PTC on 04/16/17 and IR percutaneous aspiration of anterior abdominal fluid collection on 04/24/17.      Plan:    - Continue regular diet, added ensure, calorie counts  - Continue TPN  - Aggressive bowel regimen  - IV caspofungin/Zosyn x 10 days (through 3/2)  with repeat CT abdomen/pelvis  - F/u repeat blood cultures  - Appreciate ID recommendations  - SCDs, heparin SC (hold therapeutic anticoagulation) for VTE ppx  - Dispo: pending clinical course      Gabriel Cirri, MD  05/01/2017     6:19 AM   General Surgery Resident        HPB-GI Attending Addendum:   I personally examined the patient, reviewed the notes, and discussed the plan of care with the residents and patient. Agree with detailed resident's note. Please see the note above for details of history, exam, labs, assessment/plan which reflect my input.      72 year old female with pancreatic cancer of the  uncinate who underwent Roux-en-Y pancreaticoduodenectomy after neoadjuvant chemotherapy and radiation.      Anorexia. Encouraging oral intake.   Hyperglycemia without need for insulin. Monitor.   Post operative anemia. No evidence of bleeding. Will need iron but constipation, will hold for now.   Volume overload. Self diuresis. Monitor status.  Bilateral pleural effusions with lung compression. Left greater than right. May require aspiration. Monitor for now.  Pulmonary embolism. Present on admission. No evidence of worsening.  Continue to hold anticoagulation.  Hypoalbuminemia. Malnutrition. TPN. Encourage oral intake. Reglan started. Nutritional supplementation.  Depression. Encouragement provided. Anti-depressant therapy.  counseling. Daily encouragement.  Leukocytosis improvement. Continue antibiotics for bacteremia for 14 day course.   Constipation . Aggressive regimen. Will give trial of methylnalrexone.      Delano Metz, MD, FACS  Hepatobiliary, Pancreas & GI Surgery

## 2017-05-01 NOTE — Plan of Care (Signed)
Problem: Safety  Goal: Patient will remain free of falls  Outcome: Progressing towards goal      Problem: Pain/Comfort  Goal: Patient's pain or discomfort is manageable  Outcome: Progressing towards goal      Problem: Mobility  Goal: Functional status is maintained or improved - Geriatric  Outcome: Progressing towards goal    Goal: Patient's functional status is maintained or improved  Outcome: Progressing towards goal      Problem: Nutrition  Goal: Nutritional status is maintained or improved - Geriatric  Outcome: Progressing towards goal      Problem: Cognitive function  Goal: Cognitive function will be maintained  Outcome: Progressing towards goal    Goal: Cognitive function will be maintained or return to baseline  Outcome: Progressing towards goal      Problem: Bowel Elimination  Goal: Elimination patterns are normal or improving  Outcome: Progressing towards goal      Problem: Post-Operative Hemodynamic Stability  Goal: Maintain Hemodynamic Stability  Outcome: Maintaining      Problem: Post-Operative Complications  Goal: Patient will remain free from symptoms of infection-post op  Outcome: Maintaining      Problem: Psychosocial  Goal: Demonstrates ability to cope with illness  Outcome: Progressing towards goal      Problem: Fluid and Electrolyte Imbalance  Goal: Fluid and Electrolyte imbalance  Outcome: Progressing towards goal

## 2017-05-01 NOTE — Plan of Care (Signed)
Problem: Pain/Comfort  Goal: Patient's pain or discomfort is manageable  Outcome: Progressing towards goal  Pt receiving scheduled methadone as well as IV dilaudid , c some relief reported .     Problem: Mobility  Goal: Functional status is maintained or improved - Geriatric  Outcome: Progressing towards goal  Pt very weak deconditioned     Problem: Nutrition  Goal: Nutritional status is maintained or improved - Geriatric  Outcome: Progressing towards goal  Pt c poor appetite , receiving TPN for added nutritional support     Problem: Post-Operative Bowel Elimination  Goal: Elimination pattern is normal or improving  Outcome: Progressing towards goal  Pt has started  To move bowels , receiving scheduled  Bowel prep as ordered .     Problem: Psychosocial  Goal: Demonstrates ability to cope with illness  Outcome: Progressing towards goal  Pt appears withdrawn , denies feeling depressed when asked .  Emotional support offered . Has  A supportive daughter who is also  A nurse.

## 2017-05-02 ENCOUNTER — Inpatient Hospital Stay: Payer: Medicare (Managed Care)

## 2017-05-02 DIAGNOSIS — R140 Abdominal distension (gaseous): Secondary | ICD-10-CM

## 2017-05-02 LAB — CBC AND DIFFERENTIAL
Baso # K/uL: 0.1 10*3/uL (ref 0.0–0.1)
Basophil %: 0.7 %
Eos # K/uL: 0.4 10*3/uL (ref 0.0–0.4)
Eosinophil %: 3.3 %
Hematocrit: 23 % — ABNORMAL LOW (ref 34–45)
Hemoglobin: 7.2 g/dL — ABNORMAL LOW (ref 11.2–15.7)
IMM Granulocytes #: 0.3 10*3/uL
IMM Granulocytes: 2.8 %
Lymph # K/uL: 0.9 10*3/uL — ABNORMAL LOW (ref 1.2–3.7)
Lymphocyte %: 8.1 %
MCH: 29 pg/cell (ref 26–32)
MCHC: 32 g/dL (ref 32–36)
MCV: 91 fL (ref 79–95)
Mono # K/uL: 0.9 10*3/uL (ref 0.2–0.9)
Monocyte %: 7.9 %
Neut # K/uL: 8.4 10*3/uL — ABNORMAL HIGH (ref 1.6–6.1)
Nucl RBC # K/uL: 0 10*3/uL (ref 0.0–0.0)
Nucl RBC %: 0.1 /100 WBC (ref 0.0–0.2)
Platelets: 620 10*3/uL — ABNORMAL HIGH (ref 160–370)
RBC: 2.5 MIL/uL — ABNORMAL LOW (ref 3.9–5.2)
RDW: 18.6 % — ABNORMAL HIGH (ref 11.7–14.4)
Seg Neut %: 77.2 %
WBC: 10.9 10*3/uL — ABNORMAL HIGH (ref 4.0–10.0)

## 2017-05-02 LAB — COMPREHENSIVE METABOLIC PANEL
ALT: 28 U/L (ref 0–35)
AST: 26 U/L (ref 0–35)
Albumin: 2.5 g/dL — ABNORMAL LOW (ref 3.5–5.2)
Alk Phos: 320 U/L — ABNORMAL HIGH (ref 35–105)
Anion Gap: 12 (ref 7–16)
Bilirubin,Total: 0.3 mg/dL (ref 0.0–1.2)
CO2: 22 mmol/L (ref 20–28)
Calcium: 8.3 mg/dL — ABNORMAL LOW (ref 8.6–10.2)
Chloride: 95 mmol/L — ABNORMAL LOW (ref 96–108)
Creatinine: 0.67 mg/dL (ref 0.51–0.95)
GFR,Black: 102 *
GFR,Caucasian: 89 *
Glucose: 112 mg/dL — ABNORMAL HIGH (ref 60–99)
Lab: 22 mg/dL — ABNORMAL HIGH (ref 6–20)
Potassium: 4.4 mmol/L (ref 3.3–5.1)
Sodium: 129 mmol/L — ABNORMAL LOW (ref 133–145)
Total Protein: 6.9 g/dL (ref 6.3–7.7)

## 2017-05-02 LAB — POCT GLUCOSE
Glucose POCT: 106 mg/dL — ABNORMAL HIGH (ref 60–99)
Glucose POCT: 116 mg/dL — ABNORMAL HIGH (ref 60–99)
Glucose POCT: 129 mg/dL — ABNORMAL HIGH (ref 60–99)
Glucose POCT: 139 mg/dL — ABNORMAL HIGH (ref 60–99)

## 2017-05-02 LAB — PROTIME-INR
INR: 1.3 — ABNORMAL HIGH (ref 0.9–1.1)
Protime: 14.8 s — ABNORMAL HIGH (ref 10.0–12.9)

## 2017-05-02 LAB — MAGNESIUM: Magnesium: 2 mg/dL (ref 1.6–2.5)

## 2017-05-02 LAB — TRIGLYCERIDES: Triglycerides: 196 mg/dL — AB

## 2017-05-02 LAB — APTT: aPTT: 25.8 s (ref 25.8–37.9)

## 2017-05-02 LAB — PHOSPHORUS: Phosphorus: 3.1 mg/dL (ref 2.7–4.5)

## 2017-05-02 MED ORDER — METOCLOPRAMIDE HCL 10 MG PO TABS *I*
10.0000 mg | ORAL_TABLET | Freq: Four times a day (QID) | ORAL | Status: DC
Start: 2017-05-02 — End: 2017-06-14
  Administered 2017-05-02 – 2017-06-13 (×157): 10 mg via ORAL
  Filled 2017-05-02 (×181): qty 1

## 2017-05-02 MED ORDER — FAT EMULSION SOYBEAN OIL 20 % IV EMUL *WRAPPED*
60.0000 g | Freq: Every day | INTRAVENOUS | Status: AC
Start: 2017-05-02 — End: 2017-05-03
  Administered 2017-05-02: 60 g via INTRAVENOUS
  Filled 2017-05-02: qty 300

## 2017-05-02 MED ORDER — STERILE WATER FOR INJECTION (TPN USE ONLY) *I*
INTRAVENOUS | Status: AC
Start: 2017-05-02 — End: 2017-05-03
  Filled 2017-05-02: qty 717.6

## 2017-05-02 MED ORDER — LIDOCAINE 5 % EX PTCH *I*
1.0000 | MEDICATED_PATCH | CUTANEOUS | Status: DC
Start: 2017-05-02 — End: 2017-05-28
  Administered 2017-05-02 – 2017-05-28 (×26): 1 via TRANSDERMAL
  Filled 2017-05-02 (×27): qty 1

## 2017-05-02 MED ORDER — BISACODYL 10 MG RE SUPP *I*
10.0000 mg | Freq: Once | RECTAL | Status: AC
Start: 2017-05-02 — End: 2017-05-02
  Administered 2017-05-02: 10 mg via RECTAL

## 2017-05-02 NOTE — Progress Notes (Signed)
Palliative Care Progress Note    HPI: [name] w/ PMH fibromyalgia, pancreatic CA s/p Whipple for 'cure' 03/29/17; s/p ICU w/ intub for resp failure & septic shock, weaned from pressors and extubated to HFNC 2/17 w/ IR drainage bili fluid collection 2/19, ID following & receiving IV abx (caspo ends 3/2). Palliative following for symptoms, started methadone 2/19, weaned dose 2/23 d/t concern for sedation. Recent KUB reveals full of stool & contrast dye, pt having large stools s/p enema & methylnaltrexone, overall better abd pain. Plan for FU imaging to eval stool clean out, pt w/ poor appetite & may consider starting mirtazapine.    Subjective: sleeping soundly, did not waken    Objective: chart reviewed, pt sleeping soundly, Probation officer visited briefly w/ dtr [name] in room, reports pt's overall pain seems better, anxious this afternoon and better s/p ativan prn; pt case discussed w/ covering provider, aware of pt's poor appetite and flat mood, will plan to discuss initiation of mirtazapine tomorrow as appetite stim, for insomnia and depression    Palliative Care ROS:  Unable to Respond   pt sleeping soundly this afternoon, did not waken   Last stool 2/28 x 1    Physical Examination:   BP: (110-138)/(65-80)   Temp:  [36.2 C (97.2 F)-37 C (98.6 F)]   Temp src: Temporal (02/28 1257)  Heart Rate:  [90-97]   Resp:  [16-20]   SpO2:  [95 %-98 %]   Gen appearance: [name], laying in bed, sleeping soundly and did not waken to voice   Lungs: easy resp, RA  Heart: reg  Abd: large, Bili tube, stool today  Extrem: LE edema  Skin: pale  Neuro: pt sleeping when visited, her baseline is A+O x 3, ambulates short distances w/ walker     Assessment/Plan: [name] w/ pancreatic CA, s/p Whipple in Jan. remains on TPN, now receiving IV abx s/p IR bili fluid drainage and recent KUB revealing need for bowel clean out of stool, being treated w/ every other day methylnaltrexone w/ + results. Pt has used total 4 mg IV dilaudid  prn over last 24 hrs and now sleeping comfortably, her dtr reporting pt's pain overall improved, anxiety better s/p ativan.      Pain/dyspnea  Acetaminophen 1 gm po TID  Methadone 5 mg po TID (dose weaned 2/23, may consider increasing dose if pt still w/ uncontrolled pain, encouraged her to ask for PRN dilaudid to better determine what her opioid needs are)  Dilaudid 0.5 mg IV Q 1 hr prn (x 4 on 2/27, x 3 on 2/27)    Dilaudid 1 mg IV Q 1 hr prn (x 1 on 2/27, x 2 on 2/28)  Lidocaine patch daily  Encourage incentive spirometry prn     Anxiety/agitation/nausea  Amitriptyline 25 mg po QHS  Escitalopram 20 mg po QHS  Reglan 5 mg po 4x/d (AC & QHS)  Pantoprazole 40 mg po daily  Lorazepam 0.5 mg po Q 6 hrs prn (x 1 on 2/28)  Ondansetron 4 mg IV Q 6 hrs prn (last x 1 on 2/18)  Plan to discuss starting mirtazapine w/ pt when visit tomorrow (targets appetite stim, insomnia, depression)    Prevention of constipation, last stool 2/28 (primary team plans to fu imaging to eval stool)  Colace 200 mg po BID  Lactulose 20 gm po TID  Miralax 17 gm po BID  Senna 1 tab po BID  Methylnaltrexone 12 mg SC every other day (started  2/26)  Dulcolax supp x 1 today  Reviewed splinting pelvic floor muscles when attempting to defecate for additional support     Prevention of urinary retention  Consider bladder scan prn    GOC/prognosis  Full code  Dtr Rod Mae is HCP    Pt case discussed w/ pt's dtr (RN) and covering provider. We will continue to follow along, please call if questions/concerns.    Total Time Spent 20 minutes: >50% of time was spent in counseling and/or coordination of care.     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    Hypothyroidism E03.9    Pancreatic  cancer C25.9    Anemia D64.9     hx of Pulmonary embolism I26.99    Depression F32.9    Sepsis A41.9       No Known Allergies (drug, envir, food or latex)    Scheduled Meds:    metoclopramide  10 mg Oral 4x Daily AC & HS    methylnaltrexone  12 mg Subcutaneous Every Other Day    senna  1 tablet Oral 2 times per day    ferrous gluconate  324 mg Oral Daily with breakfast    ascorbic acid  500 mg Oral Daily with breakfast    lactulose  20 g Oral TID    docusate sodium  200 mg Oral 2 times per day    polyethylene glycol  17 g Oral 2 times per day    pantoprazole  40 mg Oral QAM    caspofungin  50 mg Intravenous Q24H    acetaminophen  1,000 mg Oral Q8H    methadone  5 mg Oral Q8H    heparin (porcine)  5,000 Units Subcutaneous Q8H    insulin regular  0-20 Units Subcutaneous Q6H    piperacillin-tazobactam  3.375 g Intravenous Q6H    levothyroxine  137 mcg Oral Daily    amitriptyline  25 mg Oral Nightly    escitalopram  20 mg Oral Nightly       Continuous Infusions:    TPN (2 in 1) Adult Continuous      And    fat emulsion soybean oil      TPN (2 in 1) Adult Continuous 75 mL/hr at 05/01/17 1828       PRN Meds:  HYDROmorphone PF **OR** HYDROmorphone PF, phenol, LORazepam, heparin lock flush, ondansetron

## 2017-05-02 NOTE — Plan of Care (Signed)
Problem: Pain/Comfort  Goal: Patient's pain or discomfort is manageable  Outcome: Progressing towards goal      Problem: Mobility  Goal: Patient's functional status is maintained or improved  Outcome: Progressing towards goal      Problem: Cognitive function  Goal: Cognitive function will be maintained  Outcome: Progressing towards goal    Goal: Cognitive function will be maintained or return to baseline  Outcome: Progressing towards goal      Problem: Bowel Elimination  Goal: Elimination patterns are normal or improving  Outcome: Progressing towards goal      Problem: Post-Operative Hemodynamic Stability  Goal: Maintain Hemodynamic Stability  Outcome: Progressing towards goal      Problem: Post-Operative Complications  Goal: Patient will remain free from symptoms of infection-post op  Outcome: Progressing towards goal    Goal: Prevent post-operative complications  Outcome: Progressing towards goal      Problem: GI Bleeding Elimination  Goal: Elimination of patterns are normal or improving  Outcome: Progressing towards goal

## 2017-05-02 NOTE — Progress Notes (Addendum)
Surgical Oncology/Hepatobiliary Surgery Progress Note     LOS: 34 days     Subjective:    No acute events overnight.  Methylnaltrexone started yesterday for constipation, 2x BMs recorded  +flatus. Tolerating some PO - only taking small amount of ensure, states she feels full.   TPN continuing.  Denies pain or nausea     Objective:    Vitals Sign Ranges for Past 24 Hours:  BP: (110-138)/(65-80)   Temp:  [36 °C (96.8 °F)-36.9 °C (98.4 °F)]   Temp src: Temporal (02/28 0337)  Heart Rate:  [86-97]   Resp:  [16-20]   SpO2:  [94 %-97 %]       Physical Exam:    General Appearance: in NAD, speaking slowly/quietly  Cardiac: regular rate  Respiratory: non-labored breathing on room air  Abdomen: soft, mildly tender, distended, pre-existing ecchymosis, biliary tube to clamp  Extremities: warm    Labs:    CBC:    Recent Labs  Lab 05/02/17  0042 05/01/17  0017 04/30/17  0034 04/29/17  0031 04/27/17  2330 04/27/17  0006   WBC 10.9* 11.4* 12.6* 12.5* 11.2* 12.0*   Hemoglobin 7.2* 7.5* 7.9* 8.4* 8.2* 8.4*   Hematocrit 23* 23* 24* 27* 25* 26*   Platelets 620* 611* 596* 644* 519* 486*       Metabolic Panel:    Recent Labs  Lab 05/02/17  0042 05/01/17  0017 04/30/17  0034 04/29/17  0031 04/27/17  2330 04/27/17  0006   Sodium 129* 128* 129* 130* 131* 132*   Potassium 4.4 4.2 4.4 4.7 4.2 4.0   Chloride 95* 95* 93* 92* 96 98   CO2 22 20 23 24 22 20   UN 22* 24* 26* 26* 29* 27*   Creatinine 0.67 0.64 0.69 0.67 0.67 0.73   Glucose 112* 127* 108* 104* 102* 113*   Calcium 8.3* 8.0* 8.3* 8.6 8.4* 8.1*   Magnesium 2.0 2.0 1.9 2.0 2.1 2.1   Phosphorus 3.1 3.5 3.3 3.1 3.2 2.9          Assessment:    72 y.o. female with h/o recent PE and pancreatic cancer POD # 34  status post whipple procedure complicated by delayed gastric emptying.  NGT replaced on 04/04/17. UGI on 04/12/17 concerning for obstruction just beyond the GJ in the efferent limb.  Course complicated on 04/16/17 by rising LFTs and sepsis with CT concerning for cholangitis given biliary  tree obstruction and PV compression due to hematoma and afferent limb distention in setting of PE treatment. Is s/p IR PTC on 04/16/17 and IR percutaneous aspiration of anterior abdominal fluid collection on 04/24/17.      Plan:    - Continue regular diet, added ensure, calorie counts  - Continue TPN  - Aggressive bowel regimen   - Methylnaltrexone  - IV caspofungin/Zosyn x 10 days (through 3/2)  with repeat CT abdomen/pelvis  - Continue capped biliary drain  - F/u repeat blood cultures, appreciate ID recommendations  - SCDs, heparin SC (hold therapeutic anticoagulation) for VTE ppx  - Dispo: pending clinical course      Brittany Lynn Rocque, MD  05/02/2017     6:04 AM   General Surgery Resident      HPB-GI Attending Addendum:   I personally examined the patient, reviewed the notes, and discussed the plan of care with the residents and patient. Agree with detailed resident's note. Please see the note above for details of history, exam, labs, assessment/plan which reflect my   input.      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      Anorexia. Encouraging oral intake.   Hyperglycemia without need for insulin. Monitor.   Post operative anemia. No evidence of bleeding. Will need iron but constipation, will hold for now.   Volume overload. Self diuresis. Monitor status.  Bilateral pleural effusions with lung compression. Left greater than right. May require aspiration. Monitor for now.  Pulmonary embolism. Present on admission. No evidence of worsening.  Continue to hold anticoagulation.  Hypoalbuminemia. Malnutrition. TPN. Encourage oral intake. Reglan started. Nutritional supplementation.  Depression. Encouragement provided. Anti-depressant therapy.  counseling. Daily encouragement.  Leukocytosis improvement. Continue antibiotics for bacteremia for 14 day course.   Constipation . Aggressive regimen. Will give  trial of methylnaltrexone.       , MD, FACS  Hepatobiliary, Pancreas & GI Surgery

## 2017-05-02 NOTE — Progress Notes (Signed)
05/02/17 0700   Precautions/Observations   Other aware of new PT orders, patient currently on PT program with recommendations for SNFR.      Chad Cordial, PT, DPT  Pager# [phone]

## 2017-05-02 NOTE — Plan of Care (Signed)
Problem: Safety  Goal: Patient will remain free of falls  Outcome: Maintaining  Pt oob c 2 person assist .      Problem: Pain/Comfort  Goal: Patient's pain or discomfort is manageable  Outcome: Plan altered due to change in pt condition Date Met: 05/02/17  Pt receiving scheduled methadone , has prn IV dilaudid ,      Problem: Mobility  Goal: Functional status is maintained or improved - Geriatric  Outcome: Progressing towards goal  Pt continues to be weak   And deconditioned .  Pt is on program  c Pt    Problem: Nutrition  Goal: Patient's nutritional status is maintained or improved  Outcome: Progressing towards goal  Pt has very poor appetite , telling    Writer she is not hungry .  Pt receiving TPN for nutritional  Support .     Problem: Psychosocial  Goal: Demonstrates ability to cope with illness  Outcome: Progressing towards goal  Pt remains , withdrawn , unmotivated .   Has a supportive daughter who  is encouraging c pt .  Despite this,  pt remains  uninterested in doing much . Despite encouragement from staff .  Pt did walk from chair to bed after several minutes of writer's verbal encouragement .

## 2017-05-02 NOTE — Consults (Addendum)
Medical Nutrition Therapy - Follow Up    Admit Date: 03/29/2017    Patient Summary: 72 y.o. female with h/o recent PE and pancreatic cancer POD # 34  status post whipple procedure complicated by delayed gastric emptying.  NGT replaced on 04/04/17. UGI on 04/12/17 concerning for obstruction just beyond the [place] in the efferent limb.  Course complicated on 4/48/18 by rising LFTs and sepsis with CT concerning for cholangitis given biliary tree obstruction and PV compression due to hematoma and afferent limb distention in setting of PE treatment. Is s/p IR PTC on 04/16/17 and IR percutaneous aspiration of anterior abdominal fluid collection on 04/24/17.    Pertinent Meds: amitriptyline, Lexapro, levothyroxine, Zosyn, SSI, methadone, caspofungin, Protonix, bowel regimen including lactulose, ferrous gluconate, vitamin C, relistor, Reglan  Pertinent Labs: reviewed- Na 129 (L); Cl 95 (L); UN 22 (H); Ca 9.5 (WNL corrected for low albumin); t bili 0.3    Reviewed I/O's: BM x2     Enteral or parenteral access: PICC     Food allergies: NKFA    Current diet: regular   Supplements: Ensure 1.0 TID   TPN order: Adult Continuous plus Fat (central infusion site):     Rate: 75 mL/hr    Volume (Amino Acid/Dextrose solution): 1800 mL/day    Amino acids: 70 g/L     Dextrose: 150 g/L     Additives as follows:     Na: 130 mEq/L     K: 30 mEq/L     Ca: 10 mEq/L     Mg: 5 mEq/L     PO4: 12 mmol/L     200 mg levocarnitine      Maximize Chloride    Insulin: 0 units/LITER   Fat Emulsion 20% infusion: 60 gm/day (to be infused over 12 hours)  Provides 2022 kcal/day, 126 g protein/day, 270 g dextrose/day and 2100 mL total fluid/day (including amino acid/dextrose solution and lipid volume).     Nutrition Focused Physical Exam:    Edema: trace generalized, trace BUE, +1 BLE  Abdomen: rounded, distended, taut, loss of appetite, +BS, +flatus   Skin: erythema/redness, surgical wound to abdomen   Body fat stores: UTA- pt getting care   Lean body mass stores:  UTA- pt getting care      Anthropometrics:  Height: 170.2 cm ('5\' 7"' )    Current Weight: 73.5 kg (162 lb) (2/13); 102% IBW  Ideal Body Weight: 72.4 kg + 10%  BMI: 25.4 kg/(m^2) healthy range for age   Weight Hx: no new weight       Estimated Nutrient Needs: (Based on 72 kg)    1800-2160 kcal/day (25-30 kcal/kg)   86-108 g protein/day (1.2-1.5 g/kg)    1800-2160 mL fluid/day (25-30 mL/kg)      Nutrition Assessment and Diagnosis:   Very poor po appetite noted, mostly eating fruit. Per review of calorie counts, pt meeting only 5% of calorie needs and is eating virtually no protein. Pt may like Ensure clear as it is more like a juice. Na and Cl remain low, can cut back fluids in TPN to low end of fluid needs.     Calorie Counts:  Date Energy (kcal) Protein (g) Comments   2/23 10 0    2/24 180 1    2/25 72 0    2/26 87 1    Average 87 0.5          TPN Approval  Approval for parenteral nutrition use to continue for 7 days.  Original  Start Date: 2/5 Approved by interdisciplinary team   D/C Plan: TPN will be discontinued once pt tolerating po diet/enteral feeds       Malnutrition Status: Pt previously diagnosed with moderate malnutrition 2/5.      Nutrition Intervention:   Recommend Adult Continuous plus Fat (central infusion site):     Rate: 65 mL/hr    Volume (Amino Acid/Dextrose solution): 1560 mL/day    Amino acids: 69 g/L     Dextrose: 173 g/L     Additives as follows:     Na: 130 mEq/L     K: 33 mEq/L     Ca: 11 mEq/L     Mg: 5 mEq/L     PO4: 12 mmol/L     200 mg levocarnitine      Maximize Chloride    Insulin: 0 units/LITER   Fat Emulsion 20% infusion: 60 gm/day (to be infused over 12 hours)  Provides 1948 kcal/day, 108 g protein/day, 270 g dextrose/day and 1860 mL total fluid/day (including amino acid/dextrose solution and lipid volume).   2.  Continue other TPN monitoring parameters  3.  Continue regular diet, recommend to try Ensure Clear TID in place of Ensure 1.0     Nutrition Monitoring/Evaluation:   1. Will  monitor diet tolerance and intake, nutrition-related labs, weight trend, BM pattern, supplement acceptance.    2. Will follow up per high nutrition risk protocol.    Laurence Aly, [place]  Pager [phone]

## 2017-05-03 ENCOUNTER — Inpatient Hospital Stay: Payer: Medicare (Managed Care)

## 2017-05-03 ENCOUNTER — Other Ambulatory Visit: Payer: Self-pay | Admitting: Cardiology

## 2017-05-03 DIAGNOSIS — K750 Abscess of liver: Secondary | ICD-10-CM

## 2017-05-03 DIAGNOSIS — K7689 Other specified diseases of liver: Secondary | ICD-10-CM

## 2017-05-03 DIAGNOSIS — R1909 Other intra-abdominal and pelvic swelling, mass and lump: Secondary | ICD-10-CM

## 2017-05-03 DIAGNOSIS — I499 Cardiac arrhythmia, unspecified: Secondary | ICD-10-CM

## 2017-05-03 DIAGNOSIS — J9819 Other pulmonary collapse: Secondary | ICD-10-CM

## 2017-05-03 DIAGNOSIS — D1803 Hemangioma of intra-abdominal structures: Secondary | ICD-10-CM

## 2017-05-03 LAB — COMPREHENSIVE METABOLIC PANEL
ALT: 28 U/L (ref 0–35)
AST: 30 U/L (ref 0–35)
Albumin: 2.5 g/dL — ABNORMAL LOW (ref 3.5–5.2)
Alk Phos: 344 U/L — ABNORMAL HIGH (ref 35–105)
Anion Gap: 14 (ref 7–16)
Bilirubin,Total: 0.3 mg/dL (ref 0.0–1.2)
CO2: 20 mmol/L (ref 20–28)
Calcium: 8.4 mg/dL — ABNORMAL LOW (ref 8.6–10.2)
Chloride: 94 mmol/L — ABNORMAL LOW (ref 96–108)
Creatinine: 0.69 mg/dL (ref 0.51–0.95)
GFR,Black: 101 *
GFR,Caucasian: 88 *
Glucose: 144 mg/dL — ABNORMAL HIGH (ref 60–99)
Lab: 23 mg/dL — ABNORMAL HIGH (ref 6–20)
Potassium: 3.8 mmol/L (ref 3.3–5.1)
Sodium: 128 mmol/L — ABNORMAL LOW (ref 133–145)
Total Protein: 6.7 g/dL (ref 6.3–7.7)

## 2017-05-03 LAB — CBC AND DIFFERENTIAL
Baso # K/uL: 0 10*3/uL (ref 0.0–0.1)
Basophil %: 0 %
Eos # K/uL: 0.6 10*3/uL — ABNORMAL HIGH (ref 0.0–0.4)
Eosinophil %: 4.4 %
Hematocrit: 22 % — ABNORMAL LOW (ref 34–45)
Hemoglobin: 7 g/dL — ABNORMAL LOW (ref 11.2–15.7)
Lymph # K/uL: 0.8 10*3/uL — ABNORMAL LOW (ref 1.2–3.7)
Lymphocyte %: 6.1 %
MCH: 29 pg/cell (ref 26–32)
MCHC: 32 g/dL (ref 32–36)
MCV: 92 fL (ref 79–95)
Mono # K/uL: 0.4 10*3/uL (ref 0.2–0.9)
Monocyte %: 2.6 %
Neut # K/uL: 11.5 10*3/uL — ABNORMAL HIGH (ref 1.6–6.1)
Nucl RBC # K/uL: 0 10*3/uL (ref 0.0–0.0)
Nucl RBC %: 0.1 /100 WBC (ref 0.0–0.2)
Platelets: 597 10*3/uL — ABNORMAL HIGH (ref 160–370)
RBC: 2.4 MIL/uL — ABNORMAL LOW (ref 3.9–5.2)
RDW: 18.9 % — ABNORMAL HIGH (ref 11.7–14.4)
Seg Neut %: 82.4 %
WBC: 13.7 10*3/uL — ABNORMAL HIGH (ref 4.0–10.0)

## 2017-05-03 LAB — DIFF MANUAL
Bands %: 2 % (ref 0–10)
Diff Based On: 114 CELLS
Metamyelocyte %: 1 % (ref 0–1)
Myelocyte %: 2 % — ABNORMAL HIGH (ref 0–0)

## 2017-05-03 LAB — PHOSPHORUS: Phosphorus: 3.6 mg/dL (ref 2.7–4.5)

## 2017-05-03 LAB — POCT GLUCOSE
Glucose POCT: 112 mg/dL — ABNORMAL HIGH (ref 60–99)
Glucose POCT: 117 mg/dL — ABNORMAL HIGH (ref 60–99)
Glucose POCT: 124 mg/dL — ABNORMAL HIGH (ref 60–99)
Glucose POCT: 131 mg/dL — ABNORMAL HIGH (ref 60–99)
Glucose POCT: 133 mg/dL — ABNORMAL HIGH (ref 60–99)

## 2017-05-03 LAB — GRAM STAIN

## 2017-05-03 LAB — MAGNESIUM: Magnesium: 1.9 mg/dL (ref 1.6–2.5)

## 2017-05-03 LAB — APTT: aPTT: 28.3 s (ref 25.8–37.9)

## 2017-05-03 LAB — PROTIME-INR
INR: 1.3 — ABNORMAL HIGH (ref 0.9–1.1)
Protime: 14.5 s — ABNORMAL HIGH (ref 10.0–12.9)

## 2017-05-03 MED ORDER — LIDOCAINE HCL 1 % IJ SOLN *I*
INTRAMUSCULAR | Status: AC
Start: 2017-05-03 — End: 2017-05-03
  Filled 2017-05-03: qty 20

## 2017-05-03 MED ORDER — STERILE WATER FOR IRRIGATION IR SOLN *I*
900.0000 mL | Freq: Once | Status: AC
Start: 2017-05-03 — End: 2017-05-03

## 2017-05-03 MED ORDER — SODIUM CHLORIDE 0.9 % IV SOLN WRAPPED *I*
100.0000 mL/h | Status: DC
Start: 2017-05-03 — End: 2017-05-03
  Administered 2017-05-03: 100 mL/h via INTRAVENOUS

## 2017-05-03 MED ORDER — FAT EMULSION SOYBEAN OIL 20 % IV EMUL *WRAPPED*
60.0000 g | Freq: Every day | INTRAVENOUS | Status: AC
Start: 2017-05-03 — End: 2017-05-04
  Administered 2017-05-03: 60 g via INTRAVENOUS
  Filled 2017-05-03: qty 300

## 2017-05-03 MED ORDER — FENTANYL CITRATE 50 MCG/ML IJ SOLN *WRAPPED*
INTRAMUSCULAR | Status: AC
Start: 2017-05-03 — End: 2017-05-03
  Filled 2017-05-03: qty 2

## 2017-05-03 MED ORDER — STERILE WATER FOR INJECTION (TPN USE ONLY) *I*
INTRAVENOUS | Status: AC
Start: 2017-05-03 — End: 2017-05-04
  Filled 2017-05-03: qty 717.6

## 2017-05-03 MED ORDER — MIDAZOLAM HCL 1 MG/ML IJ SOLN *I* WRAPPED
INTRAMUSCULAR | Status: AC
Start: 2017-05-03 — End: 2017-05-03
  Filled 2017-05-03: qty 2

## 2017-05-03 MED ORDER — IOHEXOL 350 MG/ML (OMNIPAQUE) IV SOLN *I*
1.0000 mL | Freq: Once | INTRAVENOUS | Status: AC
Start: 2017-05-03 — End: 2017-05-03
  Administered 2017-05-03: 108 mL via INTRAVENOUS

## 2017-05-03 NOTE — Consults (Signed)
Medical Nutrition Therapy Brief Note: TPN check    Patient remains on TPN for nutrition support. Chart, labs, medications / TPN order, I/Os reviewed. Na 128 (L), K+ 3.8 (WNL), Cl 94 (L), CO2 12 (WNL), Ca 9.6 (WNL corrected for low albumin 2.5), PO4 3.6 (WNL), Mg 1.9 (WNL). Recent t bili 0.3 (WNL) from 3/1. BGs ranging from 106 - 144 over the past 24 hours- 12 units insulin given.  MAR reviewed. I/Os significant for BM x2, 210 ml PO. Current TPN order reviewed - no changes to TPN order needed at this time.    Recommendations:  1.  Continue Adult Continuous plus Fat (central infusion site):          Rate: 65 mL/hr         Volume (Amino Acid/Dextrose solution): 1560 mL/day                    Amino acids: 69 g/L                     Dextrose: 173 g/L                     Additives as follows:                                 Na: 130 mEq/L                                      K: 33 mEq/L                                      Ca: 11 mEq/L                                      Mg: 5 mEq/L                                      PO4: 12 mmol/L                                      200 mg levocarnitine                                       Maximize Chloride                          Insulin: 0 units/LITER              Fat Emulsion 20% infusion: 60 gm/day (to be infused over 12 hours)  Provides 1948 kcal/day, 108 g protein/day, 270 g dextrose/day and 1860 mL total fluid/day (including amino acid/dextrose solution and lipid volume).   2.  Continue other TPN monitoring parameters  3.  Continue regular diet, continue Ensure Clear TID      Will continue to monitor.     Laurence Aly, [place]  Pager [phone]

## 2017-05-03 NOTE — Progress Notes (Signed)
Interventional Radiology Pre-Procedure Handoff/Checklist    NPO: [x] YES [] NO [] N/A      Tube feed Stopped: [] YES [] NO [x] N/A     Anticoagulants:hepain sq    Telemetry: [x] YES [] NO [] N/A     Transport mode: Bed  Number of Transporters needed: 2      IV access:   PCVC Triple Lumen (percutaneous) 04/17/17 Left Internal jugular (Active)   Phlebitis Scale Grade 0 05/03/2017 12:01 PM   Infiltration Scale Grade 0 05/03/2017 12:01 PM   Proximal Lumen Status Flushed;Infusing 05/03/2017  9:00 AM   Medial Lumen Status Flushed;Infusing 05/03/2017  9:00 AM   Distal Lumen Status Capped;Flushed;Normal saline locked 05/03/2017 12:01 PM   Dressing Type Transparent 05/03/2017  9:00 AM   Dressing Status Clean, dry and intact 05/03/2017 12:01 PM   (T)Transparent Drsg Change-Q7D Dressing changed 05/02/2017 10:00 PM   Dressing Change Due 05/09/17 05/03/2017  9:00 AM   Bag to hub changed? No 05/03/2017  9:00 AM   Next bag to hub change due 05/09/17 05/03/2017  9:00 AM   Cap(s) Changed? No 05/03/2017  9:00 AM   Next cap change due 05/06/17 05/03/2017  9:00 AM   **Need for continuing central line addressed? Yes - central line still necessary 05/03/2017  9:00 AM   Freq of line accesses and lab draws addressed? Yes 05/03/2017  9:00 AM   CVP Transduced No 04/26/2017  8:00 PM       Respiratory: Room Air    Consentable:yes    Alert and Oriented to person, place and time?: yes    Code Status:Full     Does patient wear an insulin pump? [] YES [] NO [x] N/A     Precautions:none    Allergies: No Known Allergies (drug, envir, food or latex)    Eliane Decree, RN Received Handoff Report from [place], [place] for IR procedure 3:53 PM

## 2017-05-03 NOTE — Progress Notes (Signed)
Physical Therapy Treatment Note      Mobility Recommendation Mobility Recommendations: (P) Out of bed with assist.        05/03/17 1100   PT [place]   Visit Number   Visit Number [facility]) / Treatment Day (HH) 5   Precautions/Observations   Precautions used Yes   LDA Observation Monitors;Central Line   Fall Precautions General falls precautions   Other Pt resting in bed with TV on upon arrival. No family in room.   Pain Assessment   *Is the patient currently in pain? Yes   Pain (Before,During, After) Therapy Before   0-10 Scale (Pt did not rate)   Pain Location Generalized   Pain Intervention(s) Refer to nursing for pain management;Repositioned   Additional comments Pt did not endorse specific pain but complained of discomfort with everything.   Cognition   Arousal/Alertness Delayed responses to stimuli   Orientation Oriented to person;Oriented to place;Disoriented to time   Following Commands Follows simple commands with increased time;Follows simple commands with repetition  (pending willingness to do so)   Additional Comments Pt very withdrawn but talking more with gentle encouragement. She was initially asking if she will get better and understanding of the process however then began refusing all interventions and became withdrawn again once attempts at [place] mobility was initiated.   Bed Mobility   Bed mobility Tested   Supine to Sit (Pt moved to elbow prop but refused further once there.)   Sit to Supine 1 person assist;Stand by assistance;Side rails up (#);Head of bed elevated  (Pt able to lift LEs back into bed.)   Additional comments Pt refused further activity than partial EOB. Attempted to encourage her to assist with positioning once laying supine, however she stated "of course I can" when asked if she could do certain things (raised arms overheard towards bed rails, move LEs into hooklying position, etc) however then made no volitional effort to complete. Pt ultimatley assisted into  chair position in bed with food tray in front of her. She required assist for set up.   Transfers   Transfers Not tested   Additional comments Pt refused, stating she had already done too much today (despite RN reporting no OOB mobility yet). She continued to refuse despite education and encouragement. Of note, no family present at this time.   Mobility   Mobility Not tested (comment)   Therapeutic Exercises   Additional comments Performed a few reps of heel slides, knee extension, and shoulder flexion in attempts to engage pt in activity after she refused OOB activity. Pt minimally participating.   PT AM-PAC Mobility   Turning over in bed? 1   Sitting down on and standing up from a chair with arms? 1   Moving from lying on back to sitting on the side of the bed? 1   Moving to and from a bed to a chair? 4   Need to walk in hospital room? 4   Climbing 3 - 5 steps with a railing? 1   Total Raw Score 12   Standardized Score 32.29   CMS 1-100% Score 77   Assessment   Brief Assessment Remains appropriate for skilled therapy   Problem List Impaired functional mobility   Additional Comments Pt limited by depression and lacking motivation. She will require increased level of care upon discharge however at this time is not demonstrating willingness or ability to tolerate rehab.   Plan/Recommendation   Treatment Interventions [facility]  PT   PT Frequency 2-4x/wk   Mobility Recommendations Out of bed with assist.   Discharge Recommendations [facility]   Assessment/Recommendations Reviewed With: Patient;Nursing   Next PT Visit Progress ambulation with chair follow and RW; premedicate for pain.   Time Calculation   PT Timed Codes 11   PT Untimed Codes 0   PT Unbilled Time 0   PT Total Treatment 11   Plan and Onset date   Plan of Care Date 05/03/17   Treatment Start Date 04/21/17   PT Charges   $PT [facility] Charges THER EX - code 0204 (30min)         Please page with any questions or concerns.    Robby Sermon,  PT, DPT  Pager [phone]

## 2017-05-03 NOTE — Progress Notes (Signed)
SW informed that family was asking for parking assistance.     SW into see daughter, review of chart notes that all 3 gratis parking tokens have been dispensed. Discussed 30 day pass which daughter was receptive to obtaining. SW provided letter for parking pass and daughter is aware of where to obtain the pass from.     No other concerns noted at this time. Will continue to assist as needed and plan for safe discharge.

## 2017-05-03 NOTE — H&P (View-Only) (Signed)
UPDATES TO PATIENT'S CONDITION on the DAY OF SURGERY/PROCEDURE    I. Updates to Patient's Condition (to be completed by a provider privileged to complete a H&P, following reassessment of the patient by the provider):    Day of Surgery/Procedure Update:  History  (Inpatients only): I confirm that progress notes within the past 24 hours document updates to the patient's condition.    Physical  (Inpatients only): I confirm that progress notes within the past 24 hours document updates to the patient's condition.            II. Procedure Readiness   I have reviewed the patient's H&P and updated condition. By completing and signing this form, I attest that this patient is ready for surgery/procedure.    III. Attestation   I have reviewed the updated information regarding the patient's condition and it is appropriate to proceed with the planned surgery/procedure.    Earmon Phoenix, PA as of 4:25 PM 04/23/2017

## 2017-05-03 NOTE — Progress Notes (Signed)
Report Given     Handoff received from [place] on unit [facility]. Aware of patient coming to [facility] for CT.

## 2017-05-03 NOTE — Progress Notes (Signed)
Attempted to see pt, updated by dtr [name] in room that pt off flr at IR s/p new abscess finding on CT today. Will ask my PC colleagues to check on pt tomorrow for pain control and consider starting mirtazapine.    Elroy Channel NP  Palliative Care Consult Service  Pager # [phone]

## 2017-05-03 NOTE — Interval H&P Note (Signed)
UPDATES TO PATIENT'S CONDITION on the DAY OF SURGERY/PROCEDURE    I. Updates to Patient's Condition (to be completed by a provider privileged to complete a H&P, following reassessment of the patient by the provider):    Day of Surgery/Procedure Update:  History  History reviewed and no change    Physical  Physical exam updated and no change            II. Procedure Readiness   I have reviewed the patient's H&P and updated condition. By completing and signing this form, I attest that this patient is ready for surgery/procedure.    III. Attestation   I have reviewed the updated information regarding the patient's condition and it is appropriate to proceed with the planned surgery/procedure.    Maryanna Shape, MD as of 5:40 PM 05/03/2017

## 2017-05-03 NOTE — Progress Notes (Signed)
[employer] Nursing Procedure Note    Amanda Galloway  [id number]    Procedure: Perc Drain Placement (perihepatic/perisplenic)       Status: Completed    Patient tolerated procedure well     Specimen Collection: yes    Sponge count: N/A    Fluid Removed:Yes, color:purulent, amount: 20 ml  Procedure Dressing Site located:Left Upper Quadrant  Dressing Type:sterile gauze/tegaderm  Biopatch:no  Dressing status:Clean, dry and intact   Hematoma:Not evident  Medication received:Lidocaine at procedure site only  Cardiovascular:   Peripheral Pulses: N/A      Fistula: N/A    Neuro Assessment:Patient is at pre-procedure baseline    Implant patient information given to patient or parent/guardian:N/A    Report given to:Unit Nurse. Unit [facility] Name Earnestine Mealing, RN      Last Filed Vitals    05/03/17 1845   BP: 110/64   Pulse: 89   Resp: 14   Temp: 36.9 C (98.4 F)   SpO2: 96%

## 2017-05-03 NOTE — Progress Notes (Addendum)
Surgical Oncology/Hepatobiliary Surgery Progress Note     LOS: 35 days     Subjective:  On TPN  No fevers  Feeling ok  Having bowel movements and flatus  Small amount of oral intake on regular diet  CT planned today     Objective:    Vitals Sign Ranges for Past 24 Hours:  BP: (100-132)/(50-80)   Temp:  [35.5 C (95.9 F)-37.4 C (99.3 F)]   Temp src: Temporal (03/01 0743)  Heart Rate:  [89-101]   Resp:  [16]   SpO2:  [94 %-98 %]       Physical Exam:    General Appearance: in NAD, speaking slowly/quietly  Cardiac: regular rate  Respiratory: non-labored breathing on room air  Abdomen: soft, mildly tender, distended, pre-existing ecchymosis, biliary tube to clamp  Extremities: warm    Labs:    CBC:    Recent Labs  Lab 05/03/17  0012 05/02/17  0042 05/01/17  0017 04/30/17  0034 04/29/17  0031 04/27/17  2330   WBC 13.7* 10.9* 11.4* 12.6* 12.5* 11.2*   Hemoglobin 7.0* 7.2* 7.5* 7.9* 8.4* 8.2*   Hematocrit 22* 23* 23* 24* 27* 25*   Platelets 597* 620* 611* 596* 644* 007*       Metabolic Panel:    Recent Labs  Lab 05/03/17  0012 05/02/17  0042 05/01/17  0017 04/30/17  0034 04/29/17  0031 04/27/17  2330   Sodium 128* 129* 128* 129* 130* 131*   Potassium 3.8 4.4 4.2 4.4 4.7 4.2   Chloride 94* 95* 95* 93* 92* 96   CO2 _0 UN 23* 22* 24* 26* 26* 29*   Creatinine 0.69 0.67 0.64 0.69 0.67 0.67   Glucose 144* 112* 127* 108* 104* 102*   Calcium 8.4* 8.3* 8.0* 8.3* 8.6 8.4*   Magnesium 1.9 2.0 2.0 1.9 2.0 2.1   Phosphorus 3.6 3.1 3.5 3.3 3.1 3.2          Assessment:    72 y.o. female with h/o recent PE and pancreatic cancer POD # 35  status post whipple procedure complicated by delayed gastric emptying.  NGT replaced on 04/04/17. UGI on 04/12/17 concerning for obstruction just beyond the [address] in the efferent limb.  Course complicated on 03/26/95 by rising LFTs and sepsis with CT concerning for cholangitis given biliary tree obstruction and PV compression due to hematoma and afferent limb distention in setting of PE  treatment. Is s/p IR PTC on 04/16/17 and IR percutaneous aspiration of anterior abdominal fluid collection on 04/24/17.      Plan:  - Continue regular diet, added ensure, calorie counts  - Continue TPN  - Aggressive bowel regimen   - Methylnaltrexone  - CT abdomen/pelvis today  - Antibiotics ending tomorrow  - Continue capped biliary drain  - F/u repeat blood cultures, appreciate ID recommendations  - SCDs, heparin SC (hold therapeutic anticoagulation) for VTE ppx  - Dispo: pending clinical course      Rudene Re, MD  05/03/2017     8:15 AM   General Surgery Resident      [facility]B-GI Attending Addendum:   I personally examined the patient, reviewed the notes, and discussed the plan of care with the residents and patient. Agree with detailed resident's note. Please see the note above for details of history, exam, labs, assessment/plan which reflect my input.      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      Anorexia. Encouraging oral intake.   Hyperglycemia. Insulin sliding scale.   Post operative anemia. No evidence of bleeding. Will need iron but constipation, will hold for now.   Bilateral pleural effusions with lung compression. Left greater than right. May require aspiration. Monitor for now.  Pulmonary embolism. Present on admission. No evidence of worsening.  Continue to hold anticoagulation.  Hypoalbuminemia. Malnutrition. TPN. Encourage oral intake. Reglan started. Nutritional supplementation.  Depression. Encouragement provided. Anti-depressant therapy.  counseling. Daily encouragement.  Leukocytosis. Continue antibiotics.   Constipation . Aggressive regimen. Will give trial of methylnaloxone.     CT scan today showed abdominal and hepatic fluid collection with air. No evidence of extravasation. Drain placed by IR.   Continue Broad spectrum antibiotics.        Delano Metz, MD, FACS  Hepatobiliary,  Pancreas & GI Surgery

## 2017-05-03 NOTE — Plan of Care (Signed)
Impaired Bed Mobility    • STG - IMPROVE BED MOBILITY Completed or Resolved        Impaired Transfers    • STG - IMPROVE TRANSFERS Completed or Resolved

## 2017-05-03 NOTE — Invasive Procedure Plan of Care (Signed)
Invasive Procedure Plan of Care (Consent Form 419):   Condition(s) Addressed: Abnormal fluid collection in the upper abdomen- perisplenic and perihepatic which may be infected   Performing Provider: Maryanna Shape, and Vascular and Interventional Radiology Attendings, Fellows, Residents and Advanced Practice Providers      Side:    Procedure: Fluid aspiration or drainage of infected fluid collection in the upper abdomen- perisplenic and perihepatic which may be infected   Special Equipment:    Planned Anesthesia: Pending   Benefits: Symptomatic relief by aspirating or draining fluid. Obtain samples to guide further management   Risks: Bleeding, infection, peritonitis, fistula, injury to surrounding organs and structures, and in very rare circumstances death.   Alternatives: Not to perform the procedure   Expected Length of Stay: 0 day(s)     I, or a designated member of my surgical team, have discussed the planned procedure, including the potential for any transfusion of blood products or receipt of tissue as necessary, expected benefits, the potential complications and risks and possible alternatives and their benefits and risks with the patient or the patient's surrogate. In my opinion, the patent or the patient's surrogate understands the proposed procedure, its risks, benefits, and alternatives.    Electronically signed by Maryanna Shape, MD at 5:42 PM     Patient Consent:  I hereby give my consent and authorize Jasslyn Finkel, Norton Pastel, and Vascular and Interventional Radiology Attendings, Fellows, Residents and Advanced Practice Providers     (The list of possible assistants, all of whom are privileged to provide surgical services at the hospital, is available)  To treat the following: Abnormal fluid collection in the upper abdomen- perisplenic and perihepatic which may be infected  Procedure includes: Fluid aspiration or drainage of infected fluid collection in the upper abdomen- perisplenic and perihepatic which  may be infected  Laterality:   1 The care provider has explained my condition to me, the benefits of having the above treatment procedure, and alternate ways of treating my condition. I understand that no guarantees have been made to me about the result of the treatment. The alternatives to this procedure include: Not to perform the procedure   2 The care provider has discussed with me the reasonably foreseeable risks of the treatment and that there may be undesirable results. The risks that are specifically related to this procedure include: Bleeding, infection, peritonitis, fistula, injury to surrounding organs and structures, and in very rare circumstances death.   3 I understand that during the treatment a condition may be discovered which was not known before the treatment started. Therefore, I authorize the care provider to perform any additional or different treatment which is thought necessary and available.   4 Any tissue, parts, or substances removed during the procedure may be retained or disposed of in accordance with customary scientific, educational and clinical practice.   5 Vendor information if appropriate:    6 If blood products are needed, I would agree:    7 If tissue products are needed, I would agree:    8 Blood/Tissue use limitations and/or exclusions:       I have carefully read and fully understand this informed consent form, and have had sufficient opportunity to discuss my condition and the above procedure(s) with the care provider and his/her associates, and all of my questions have been answered to my satisfaction. I understand that my surgeon/provider performing the procedure may not be physically present in the operating/procedure room the entire time that I am  there. My surgeon/provider has answered my questions regarding this and how it may relate to my surgery/procedure. I agree to the Plan of Care as outlined above.                       Patient Signature   (or Parent/Legal  Guardian if pt is unable to sign or is a minor)  Date/Time     Electronic Signatures will display at the bottom of the consent form.

## 2017-05-03 NOTE — Procedures (Signed)
Procedure Report    The patient is status post left upper quadrant drainage catheter placement. A 10Fr APDL was placed in LUQ collection that communicates through the midline into a perihepatic collection. 20cc of purulent fluid was aspirated.     The patient tolerated the procedure well without immediate post procedural complications.      See full dictation report for further details.

## 2017-05-03 NOTE — Plan of Care (Signed)
Problem: Impaired Bed Mobility  Goal: STG - IMPROVE BED MOBILITY  Patient will perform bed mobility with rails and the head of bed up with Contact guard assist of 1    Time frame: 5-7 days    Problem: Impaired Transfers  Goal: STG - IMPROVE TRANSFERS  Patient will complete Sit to stand transfers using least restrictive assistive device with Stand by assist of 1    Time frame: 5-7 days    Problem: Impaired Ambulation  Goal: STG - IMPROVE AMBULATION  Patient will ambulate 50 to 99 feet using a rolling walker with Contact guard assistof 1    Time frame: 5-7 days

## 2017-05-04 LAB — CBC AND DIFFERENTIAL
Baso # K/uL: 0.1 10*3/uL (ref 0.0–0.1)
Basophil %: 0.9 %
Eos # K/uL: 0.5 10*3/uL — ABNORMAL HIGH (ref 0.0–0.4)
Eosinophil %: 3.5 %
Hematocrit: 24 % — ABNORMAL LOW (ref 34–45)
Hemoglobin: 7.4 g/dL — ABNORMAL LOW (ref 11.2–15.7)
Lymph # K/uL: 0.4 10*3/uL — ABNORMAL LOW (ref 1.2–3.7)
Lymphocyte %: 2.6 %
MCH: 29 pg/cell (ref 26–32)
MCHC: 31 g/dL — ABNORMAL LOW (ref 32–36)
MCV: 95 fL (ref 79–95)
Mono # K/uL: 0 10*3/uL — ABNORMAL LOW (ref 0.2–0.9)
Monocyte %: 0 %
Neut # K/uL: 11.9 10*3/uL — ABNORMAL HIGH (ref 1.6–6.1)
Nucl RBC # K/uL: 0 10*3/uL (ref 0.0–0.0)
Nucl RBC %: 0.2 /100 WBC (ref 0.0–0.2)
Platelets: 565 10*3/uL — ABNORMAL HIGH (ref 160–370)
RBC: 2.5 MIL/uL — ABNORMAL LOW (ref 3.9–5.2)
RDW: 18.9 % — ABNORMAL HIGH (ref 11.7–14.4)
Seg Neut %: 88.7 %
WBC: 13.1 10*3/uL — ABNORMAL HIGH (ref 4.0–10.0)

## 2017-05-04 LAB — URINALYSIS REFLEX TO CULTURE
Blood,UA: NEGATIVE
Ketones, UA: NEGATIVE
Leuk Esterase,UA: NEGATIVE
Nitrite,UA: NEGATIVE
Protein,UA: NEGATIVE mg/dL
Specific Gravity,UA: 1.024 (ref 1.002–1.030)
pH,UA: 5 (ref 5.0–8.0)

## 2017-05-04 LAB — COMPREHENSIVE METABOLIC PANEL
ALT: 118 U/L — ABNORMAL HIGH (ref 0–35)
AST: 144 U/L — ABNORMAL HIGH (ref 0–35)
Albumin: 2.5 g/dL — ABNORMAL LOW (ref 3.5–5.2)
Alk Phos: 400 U/L — ABNORMAL HIGH (ref 35–105)
Anion Gap: 17 — ABNORMAL HIGH (ref 7–16)
Bilirubin,Total: 0.4 mg/dL (ref 0.0–1.2)
CO2: 17 mmol/L — ABNORMAL LOW (ref 20–28)
Calcium: 8.2 mg/dL — ABNORMAL LOW (ref 8.6–10.2)
Chloride: 97 mmol/L (ref 96–108)
Creatinine: 0.68 mg/dL (ref 0.51–0.95)
GFR,Black: 102 *
GFR,Caucasian: 88 *
Glucose: 137 mg/dL — ABNORMAL HIGH (ref 60–99)
Lab: 19 mg/dL (ref 6–20)
Potassium: 4.3 mmol/L (ref 3.3–5.1)
Sodium: 131 mmol/L — ABNORMAL LOW (ref 133–145)
Total Protein: 6.9 g/dL (ref 6.3–7.7)

## 2017-05-04 LAB — DIFF MANUAL
Bands %: 2 % (ref 0–10)
Diff Based On: 115 CELLS
Metamyelocyte %: 1 % (ref 0–1)
Myelocyte %: 2 % — ABNORMAL HIGH (ref 0–0)

## 2017-05-04 LAB — RBC MORPHOLOGY

## 2017-05-04 LAB — POCT GLUCOSE
Glucose POCT: 159 mg/dL — ABNORMAL HIGH (ref 60–99)
Glucose POCT: 140 mg/dL — ABNORMAL HIGH (ref 60–99)
Glucose POCT: 114 mg/dL — ABNORMAL HIGH (ref 60–99)

## 2017-05-04 LAB — LACTATE, PLASMA: Lactate: 1.6 mmol/L (ref 0.5–2.2)

## 2017-05-04 MED ORDER — SODIUM CHLORIDE 0.9 % IV BOLUS *I*
500.0000 mL | Freq: Once | Status: AC
Start: 2017-05-04 — End: 2017-05-04
  Administered 2017-05-04: 500 mL via INTRAVENOUS

## 2017-05-04 MED ORDER — FAT EMULSION SOYBEAN OIL 20 % IV EMUL *WRAPPED*
60.0000 g | Freq: Every day | INTRAVENOUS | Status: AC
Start: 2017-05-04 — End: 2017-05-05
  Administered 2017-05-04: 60 g via INTRAVENOUS
  Filled 2017-05-04: qty 300

## 2017-05-04 MED ORDER — SODIUM CHLORIDE 0.9 % IV SOLN WRAPPED *I*
100.0000 mL/h | Status: DC
Start: 2017-05-04 — End: 2017-05-04
  Administered 2017-05-04: 100 mL/h via INTRAVENOUS

## 2017-05-04 MED ORDER — ERTAPENEM IN NS 1 GM *I*
1000.0000 mg | INTRAVENOUS | Status: DC
Start: 2017-05-04 — End: 2017-05-14
  Administered 2017-05-04 – 2017-05-13 (×10): 1000 mg via INTRAVENOUS
  Filled 2017-05-04 (×12): qty 50

## 2017-05-04 MED ORDER — STERILE WATER FOR INJECTION (TPN USE ONLY) *I*
INTRAVENOUS | Status: AC
Start: 2017-05-04 — End: 2017-05-05
  Filled 2017-05-04: qty 717.6

## 2017-05-04 NOTE — Progress Notes (Signed)
Surgical Oncology/Hepatobiliary Surgery Progress Note     LOS: 36 days     Subjective:  Febrile overnight to 38.8  LUQ perc drain placed by IR with purulent output  On TPN  Having bowel movements and passing flatus  Small amount of oral intake on regular diet     Objective:    Vitals Sign Ranges for Past 24 Hours:  BP: (104-179)/(54-149)   Temp:  [36.1 C (97 F)-38.8 C (101.8 F)]   Temp src: Temporal (03/02 0530)  Heart Rate:  [82-110]   Resp:  [14-28]   SpO2:  [90 %-97 %]       Physical Exam:    General Appearance: in NAD, speaking slowly/quietly  Cardiac: regular rate  Respiratory: non-labored breathing on 2LNC  Abdomen: soft, non-tender, distended, pre-existing ecchymosis, biliary tube to clamp, LUQ drain with purulent output. Fluid   Extremities: warm    Labs:    CBC:    Recent Labs  Lab 05/04/17  0515 05/03/17  0012 05/02/17  0042 05/01/17  0017 04/30/17  0034 04/29/17  0031   WBC 13.1* 13.7* 10.9* 11.4* 12.6* 12.5*   Hemoglobin 7.4* 7.0* 7.2* 7.5* 7.9* 8.4*   Hematocrit 24* 22* 23* 23* 24* 27*   Platelets 565* 597* 620* 611* 596* 782*       Metabolic Panel:    Recent Labs  Lab 05/04/17  0515 05/03/17  0012 05/02/17  0042 05/01/17  0017 04/30/17  0034 04/29/17  0031 04/27/17  2330   Sodium 131* 128* 129* 128* 129* 130* 131*   Potassium 4.3 3.8 4.4 4.2 4.4 4.7 4.2   Chloride 97 94* 95* 95* 93* 92* 96   CO2 17* '20 22 20 23 24 22   ' UN 19 23* 22* 24* 26* 26* 29*   Creatinine 0.68 0.69 0.67 0.64 0.69 0.67 0.67   Glucose 137* 144* 112* 127* 108* 104* 102*   Calcium 8.2* 8.4* 8.3* 8.0* 8.3* 8.6 8.4*   Magnesium  --  1.9 2.0 2.0 1.9 2.0 2.1   Phosphorus  --  3.6 3.1 3.5 3.3 3.1 3.2          Assessment:    72 y.o. female with h/o recent PE and pancreatic cancer POD # 36  status post whipple procedure complicated by delayed gastric emptying.  NGT replaced on 04/04/17. UGI on 04/12/17 concerning for obstruction just beyond the [place] in the efferent limb.  Course complicated on 06/26/51 by rising LFTs and sepsis with CT  concerning for cholangitis given biliary tree obstruction and PV compression due to hematoma and afferent limb distention in setting of PE treatment. Is s/p IR PTC on 04/16/17 and IR percutaneous aspiration of anterior abdominal fluid collection on 04/24/17.      Plan:  - Plan for IR drain of midline intra-abd fluid collection  - Continue regular diet, added ensure, calorie counts  - Continue TPN  - Aggressive bowel regimen   - Methylnaltrexone  - Continue zosyn/caspo, f/u repeat blood cultures, appreciate ID recs  - Continue capped biliary drain  - SCDs, heparin SC (hold therapeutic anticoagulation) for VTE ppx  - Dispo: pending clinical course    Cristie Hem, MD  05/04/2017     6:24 AM   General Surgery Resident

## 2017-05-04 NOTE — Progress Notes (Signed)
Pt rating abdominal pain 10/10 throughout the night. Abdomen remains soft. Medicated with IV dilaudid per order. Temp 38.8, RR 28, HR 110. PA [place] notified, in to see patient. Blood cultures/ urine sent per order. Pt remains lethargic, requiring 2-3 assist to move in bed, not following commands easily this morning.  Will continue to monitor.

## 2017-05-04 NOTE — Progress Notes (Signed)
Interventional Radiology Progress Note    Discussed with primary care team about the possibility of placing a drain in the mid-abdominal fluid collection. Reviewed CT imaging and did not see clear communication of the superior and inferior abdominal fluid collection. However, during placement of the drainage catheter for the superior abdominal fluid collection, there was communication seen between the two fluid collections on ultrasound, suggesting that the two fluid collections are connected. In the setting of downtrending WBC and stable vitals, IR will monitor clinical status. If clinical status deteriorates, please contact IR for re-assessment.    Mittie Bodo, MD  PGY-2  [employer]  05/04/2017  1:12PM

## 2017-05-05 ENCOUNTER — Inpatient Hospital Stay: Payer: Medicare (Managed Care)

## 2017-05-05 DIAGNOSIS — R509 Fever, unspecified: Secondary | ICD-10-CM

## 2017-05-05 LAB — COMPREHENSIVE METABOLIC PANEL
ALT: 90 U/L — ABNORMAL HIGH (ref 0–35)
AST: 73 U/L — ABNORMAL HIGH (ref 0–35)
Albumin: 2.3 g/dL — ABNORMAL LOW (ref 3.5–5.2)
Alk Phos: 403 U/L — ABNORMAL HIGH (ref 35–105)
Anion Gap: 12 (ref 7–16)
Bilirubin,Total: 0.3 mg/dL (ref 0.0–1.2)
CO2: 21 mmol/L (ref 20–28)
Calcium: 8.2 mg/dL — ABNORMAL LOW (ref 8.6–10.2)
Chloride: 98 mmol/L (ref 96–108)
Creatinine: 0.65 mg/dL (ref 0.51–0.95)
GFR,Black: 103 *
GFR,Caucasian: 90 *
Glucose: 130 mg/dL — ABNORMAL HIGH (ref 60–99)
Lab: 21 mg/dL — ABNORMAL HIGH (ref 6–20)
Potassium: 3.7 mmol/L (ref 3.3–5.1)
Sodium: 131 mmol/L — ABNORMAL LOW (ref 133–145)
Total Protein: 6.4 g/dL (ref 6.3–7.7)

## 2017-05-05 LAB — CBC AND DIFFERENTIAL
Baso # K/uL: 0 10*3/uL (ref 0.0–0.1)
Basophil %: 0 %
Eos # K/uL: 0.6 10*3/uL — ABNORMAL HIGH (ref 0.0–0.4)
Eosinophil %: 5.2 %
Hematocrit: 21 % — ABNORMAL LOW (ref 34–45)
Hemoglobin: 6.6 g/dL — ABNORMAL LOW (ref 11.2–15.7)
Lymph # K/uL: 1 10*3/uL — ABNORMAL LOW (ref 1.2–3.7)
Lymphocyte %: 7.8 %
MCH: 29 pg/cell (ref 26–32)
MCHC: 32 g/dL (ref 32–36)
MCV: 91 fL (ref 79–95)
Mono # K/uL: 0.1 10*3/uL — ABNORMAL LOW (ref 0.2–0.9)
Monocyte %: 0.9 %
Neut # K/uL: 10.2 10*3/uL — ABNORMAL HIGH (ref 1.6–6.1)
Nucl RBC # K/uL: 0 10*3/uL (ref 0.0–0.0)
Nucl RBC %: 0 /100 WBC (ref 0.0–0.2)
Platelets: 525 10*3/uL — ABNORMAL HIGH (ref 160–370)
RBC: 2.3 MIL/uL — ABNORMAL LOW (ref 3.9–5.2)
RDW: 19.1 % — ABNORMAL HIGH (ref 11.7–14.4)
Seg Neut %: 83.4 %
WBC: 12.1 10*3/uL — ABNORMAL HIGH (ref 4.0–10.0)

## 2017-05-05 LAB — RBC MORPHOLOGY

## 2017-05-05 LAB — DIFF MANUAL
Bands %: 1 % (ref 0–10)
Diff Based On: 115 CELLS
Metamyelocyte %: 1 % (ref 0–1)
Myelocyte %: 1 % — ABNORMAL HIGH (ref 0–0)

## 2017-05-05 LAB — POCT GLUCOSE
Glucose POCT: 105 mg/dL — ABNORMAL HIGH (ref 60–99)
Glucose POCT: 124 mg/dL — ABNORMAL HIGH (ref 60–99)
Glucose POCT: 124 mg/dL — ABNORMAL HIGH (ref 60–99)
Glucose POCT: 127 mg/dL — ABNORMAL HIGH (ref 60–99)
Glucose POCT: 140 mg/dL — ABNORMAL HIGH (ref 60–99)

## 2017-05-05 LAB — GRAM STAIN

## 2017-05-05 LAB — MAGNESIUM: Magnesium: 1.8 mg/dL (ref 1.6–2.5)

## 2017-05-05 LAB — PHOSPHORUS: Phosphorus: 3.1 mg/dL (ref 2.7–4.5)

## 2017-05-05 MED ORDER — SODIUM CHLORIDE 0.9 % IV SOLN WRAPPED *I*
6.0000 mg/kg | INTRAVENOUS | Status: DC
Start: 2017-05-05 — End: 2017-05-05

## 2017-05-05 MED ORDER — MAGNESIUM SULFATE 2 GM IN 50 ML *WRAPPED*
2000.0000 mg | Freq: Once | INTRAVENOUS | Status: AC
Start: 2017-05-05 — End: 2017-05-05
  Administered 2017-05-05: 2000 mg via INTRAVENOUS
  Filled 2017-05-05: qty 50

## 2017-05-05 MED ORDER — ALTEPLASE 2 MG IJ SOLR *I*
0.5000 mg | Freq: Once | INTRAMUSCULAR | Status: AC
Start: 2017-05-06 — End: 2017-05-06
  Administered 2017-05-06: 2 mg
  Filled 2017-05-05: qty 2

## 2017-05-05 MED ORDER — ALTEPLASE 2 MG IJ SOLR *I*
0.5000 mg | Freq: Once | INTRAMUSCULAR | Status: DC
Start: 2017-05-05 — End: 2017-05-05
  Filled 2017-05-05: qty 2

## 2017-05-05 MED ORDER — SODIUM CHLORIDE 0.9 % IV SOLN WRAPPED *I*
6.0000 mg/kg | INTRAVENOUS | Status: DC
Start: 2017-05-05 — End: 2017-05-07
  Administered 2017-05-05 – 2017-05-06 (×2): 450 mg via INTRAVENOUS
  Filled 2017-05-05 (×3): qty 9

## 2017-05-05 MED ORDER — ALTEPLASE 2 MG IJ SOLR *I*
0.5000 mg | Freq: Once | INTRAMUSCULAR | Status: AC
Start: 2017-05-05 — End: 2017-05-05
  Filled 2017-05-05: qty 2

## 2017-05-05 MED ORDER — FAT EMULSION SOYBEAN OIL 20 % IV EMUL *WRAPPED*
60.0000 g | Freq: Every day | INTRAVENOUS | Status: AC
Start: 2017-05-05 — End: 2017-05-06
  Administered 2017-05-05: 60 g via INTRAVENOUS
  Filled 2017-05-05: qty 300

## 2017-05-05 MED ORDER — FENTANYL CITRATE 50 MCG/ML IJ SOLN *WRAPPED*
INTRAMUSCULAR | Status: AC
Start: 2017-05-05 — End: 2017-05-05
  Filled 2017-05-05: qty 2

## 2017-05-05 MED ORDER — STERILE WATER FOR INJECTION (TPN USE ONLY) *I*
INTRAVENOUS | Status: AC
Start: 2017-05-05 — End: 2017-05-06
  Filled 2017-05-05: qty 717.6

## 2017-05-05 MED ORDER — LIDOCAINE HCL 1 % IJ SOLN *I*
INTRAMUSCULAR | Status: AC
Start: 2017-05-05 — End: 2017-05-05
  Filled 2017-05-05: qty 20

## 2017-05-05 NOTE — Progress Notes (Signed)
[employer] Nursing Procedure Note    Shereese Bonnie  [id number]    Procedure: perc drain        Status: Completed    Patient tolerated procedure well     Specimen Collection: yes    Sponge count: N/A    Fluid Removed:Yes, color:bloody and mucoid, amount: 60 ml  Procedure Dressing Site located:Abdomen  Dressing Type:sterile gauze/tegaderm  Biopatch:no  Dressing status:Clean, dry and intact   Hematoma:Not evident  Medication received:None  Cardiovascular:   Peripheral Pulses: Present post procedure      Fistula: N/A    Neuro Assessment:Patient is at pre-procedure baseline    Implant patient information given to patient or parent/guardian:N/A    Report given to:Unit Nurse. Unit wcc5 Name Earnestine Mealing RN      Last Filed Vitals    05/05/17 1158   BP: 106/60   Pulse: 87   Resp: 18   Temp:    SpO2: 100%

## 2017-05-05 NOTE — Procedures (Addendum)
Procedure Report    The patient is status post [name] and CT guided drainage of an inferior midabdominal collection. An 8Fr APDL was placed. 60cc of blood tinged pus was aspirated and sent for culture.       The patient tolerated the procedure well without immediate post procedural complications.  See full dictation report for further details.     Discussed with Dr. Iantha Fallen

## 2017-05-05 NOTE — Invasive Procedure Plan of Care (Signed)
Invasive Procedure Plan of Care (Consent Form 419):   Condition(s) Addressed: Abnormal fluid collection mid abdomen/ midline which may be infected   Performing Provider: Rella Larve, and Vascular and Interventional Radiology Attendings, Fellows, Residents and Advanced Practice Providers      Side:    Procedure: Fluid aspiration or drainage catheter placement    Special Equipment:    Planned Anesthesia: Local   Benefits: Symptomatic relief by aspirating or draining fluid. Obtain samples to guide further management   Risks: Bleeding, infection, peritonitis, fistula, injury to surrounding organs and structures, and in very rare circumstances death.   Alternatives: Not to perform the procedure   Expected Length of Stay: 0 day(s)     I, or a designated member of my surgical team, have discussed the planned procedure, including the potential for any transfusion of blood products or receipt of tissue as necessary, expected benefits, the potential complications and risks and possible alternatives and their benefits and risks with the patient or the patient's surrogate. In my opinion, the patent or the patient's surrogate understands the proposed procedure, its risks, benefits, and alternatives.    Electronically signed by Mittie Bodo, MD at 11:13 AM     Patient Consent:  I hereby give my consent and authorize Vihaan Gloss, Halina Maidens, and Vascular and Interventional Radiology Attendings, Fellows, Residents and Advanced Practice Providers     (The list of possible assistants, all of whom are privileged to provide surgical services at the hospital, is available)  To treat the following: Abnormal fluid collection mid abdomen/ midline which may be infected  Procedure includes: Fluid aspiration or drainage catheter placement   Laterality:   1 The care provider has explained my condition to me, the benefits of having the above treatment procedure, and alternate ways of treating my condition. I understand that no  guarantees have been made to me about the result of the treatment. The alternatives to this procedure include: Not to perform the procedure   2 The care provider has discussed with me the reasonably foreseeable risks of the treatment and that there may be undesirable results. The risks that are specifically related to this procedure include: Bleeding, infection, peritonitis, fistula, injury to surrounding organs and structures, and in very rare circumstances death.   3 I understand that during the treatment a condition may be discovered which was not known before the treatment started. Therefore, I authorize the care provider to perform any additional or different treatment which is thought necessary and available.   4 Any tissue, parts, or substances removed during the procedure may be retained or disposed of in accordance with customary scientific, educational and clinical practice.   5 Vendor information if appropriate:    6 If blood products are needed, I would agree:    7 If tissue products are needed, I would agree:    8 Blood/Tissue use limitations and/or exclusions:       I have carefully read and fully understand this informed consent form, and have had sufficient opportunity to discuss my condition and the above procedure(s) with the care provider and his/her associates, and all of my questions have been answered to my satisfaction. I understand that my surgeon/provider performing the procedure may not be physically present in the operating/procedure room the entire time that I am there. My surgeon/provider has answered my questions regarding this and how it may relate to my surgery/procedure. I agree to the Plan of Care as outlined above.  Patient Signature   (or Parent/Legal Guardian if pt is unable to sign or is a minor)  Date/Time     Electronic Signatures will display at the bottom of the consent form.

## 2017-05-05 NOTE — Progress Notes (Signed)
Pt continually stated that pain in abdomen is not getting better. When patient awake stated pain to be 8-10 in upper abdomen- sharp, continuous pain. Dilaudid 1 mg IV given x3 this shift. Pt able to rest for 2-3 hrs at one time this shift. Heat packs and repositioning done to help with pain control with minimal pain relief.   Perc drain flushed with 10cc per order. Foul tan/ milky drainage noted. VSS.  Pt minimal PO intake. Took minimal medications orally. Daughter at bedside all evening. Pt delayed responses at times. Turned and positioned Q 2 hrs, placed on LAL mattress this evening.     Cont to monitor.  Janyth Pupa, RN

## 2017-05-05 NOTE — Progress Notes (Signed)
Interventional Radiology Pre-Procedure Handoff/Checklist    NPO: [x] YES [] NO [] N/A     Tube feed Stopped: [] YES [] NO [] N/A     Anticoagulants:none    Telemetry: [x] YES [] NO [] N/A     Transport mode: Bed  Number of Transporters needed: 2      IV access:   PCVC Triple Lumen (percutaneous) 04/17/17 Left Internal jugular (Active)   Phlebitis Scale Grade 0 05/05/2017 12:17 AM   Infiltration Scale Grade 0 05/05/2017 12:17 AM   Proximal Lumen Status Blood return noted;Flushed;Infusing 05/05/2017 12:17 AM   Medial Lumen Status Blood return noted;Flushed;Infusing 05/05/2017 12:17 AM   Distal Lumen Status Blood return noted;Flushed;Infusing 05/05/2017 12:17 AM   Dressing Type Transparent;BioPatch 05/05/2017 12:17 AM   Dressing Status Clean, dry and intact 05/05/2017 12:17 AM   (T)Transparent Drsg Change-Q7D Dressing changed 05/02/2017 10:00 PM   Dressing Change Due 05/09/17 05/05/2017 12:17 AM   Bag to hub changed? No 05/05/2017 12:17 AM   Next bag to hub change due 05/06/17 05/05/2017 12:17 AM   Cap(s) Changed? No 05/05/2017 12:17 AM   Next cap change due 05/06/17 05/05/2017 12:17 AM   **Need for continuing central line addressed? Yes - central line still necessary 05/05/2017 12:17 AM   Freq of line accesses and lab draws addressed? Yes 05/05/2017 12:17 AM   CVP Transduced No 05/05/2017 12:17 AM       Respiratory: Room Air    Consentable:yes    Alert and Oriented to person, place and time?: yes    Code Status:full       Does patient wear an insulin pump? [] YES [] NO [] N/A     Precautions:none    Allergies: No Known Allergies (drug, envir, food or latex)    Clent Ridges, RN Received Handoff Report from [facility]   for IR procedure 10:11 AM

## 2017-05-05 NOTE — Progress Notes (Addendum)
ID Progress Note    Interval:  Known to ID service. Recalled for reaccumulation of a larger abscess.     05/03/17: repeat CT A/P obtained per ID recommendations and showed interval increase in a large lobulated midline and left upper abdominal intrahepatic and extrahepatic air and fluid collection which involves the left lobe of the liver and left subdiaphragmatic space, which measures 18.3 x 6.8 cm with connections to the epigastric area midline gas and fluid collection) that was previously sampled on 04/23/17) measuring  7.8 x 3.7cm.    05/03/17: IR-guided I&D with drain placement of the left upper quadrant collection tracking across the midline into a left perihepatic collection.  GS > 25 PMNs, many GNB and GN diplococci, Cultures with Enterobacter cloacae complex, C. Albicans and C. Glabrata    05/04/17: Spiked fevers with Tmax 101.8. Lower BPs, Stable leukocytosis. Blood cultures from left IJ, periphery sent and growing VRE and Enterobacter cloacae (see micro below)    05/05/17:  IR-guided I&D with drain placement of the intrahepatic collection. 60cc of   blood tinged pus was aspirated and sent for culture. GS 1-10 PMNs, very few GPC in pairs. Cultures pending.       Subjective:    Patient reports continued abdominal pain, anorexia, sickness.    Objective:    Temp:  [36 C (96.8 F)-37.4 C (99.3 F)] 36.3 C (97.3 F)  Heart Rate:  [85-103] 86  Resp:  [16-20] 18  BP: (106-132)/(60-80) 126/72    PHYSICAL EXAM  General: Sitting in chair, very uncomfortable and very tired  appearing, cooperative  Lines: Left IJ CDI, Right chest Mediport de-accessed, PIVs  HEENT: Atraumatic head, EOMI  Cardiovascular: Regular rate and rhythm, no murmurs  Lungs: Breathing well on room air  Abdomen: Soft, tender, 2 left-sided drains in place from the I&Ds above, and prior hepatic duct drain on right side  Neurologic: Tired  Extremities: No cyanosis or edema bilaterally    Labs:      Recent Labs  Lab 05/05/17  0617 05/04/17  0515  05/03/17  0012   WBC 12.1* 13.1* 13.7*   Hemoglobin 6.6* 7.4* 7.0*   Hematocrit 21* 24* 22*   Platelets 525* 565* 597*   Seg Neut % 83.4 88.7 82.4   Bands % _0 Recent Labs  Lab 05/05/17  0617 05/04/17  0515 05/03/17  0012   Sodium 131* 131* 128*   Potassium 3.7 4.3 3.8   Chloride 98 97 94*   CO2 21 17* 20   UN 21* 19 23*   Creatinine 0.65 0.68 0.69   Glucose 130* 137* 144*   Calcium 8.2* 8.2* 8.4*       Recent Labs  Lab 05/05/17  0617 05/04/17  0515 05/03/17  0012   Albumin 2.3* 2.5* 2.5*   Total Protein 6.4 6.9 6.7   ALT 90* 118* 28   AST 73* 144* 30   Alk Phos 403* 400* 344*     Recent Labs      05/03/17   0012   INR  1.3*       No results for input(s): ESR, CRP in the last 168 hours.     Recent Labs  Lab 05/04/17  0515   Lactate 1.6        Imaging:  Ct Abdomen And Pelvis With Contrast    Result Date: 05/03/2017  1. Interval increase size of a now large lobulated midline and left upper abdominal  intrahepatic and extrahepatic air and fluid collection consistent with abscess which involves the liver's left lobe and left subdiaphragmatic space. This may have small connections to an epigastric area midline gas and fluid containing abscess that was previously sampled. The amount of gas within these fluid collections is most likely due to a communication with bowel/bowel perforation. There is only a minimal amount of oral contrast therefore the bowel cannot be fully assessed. 2. Besides the large abscess involving the left lobe of the liver there are 4 other focal liver findings described above. One is associated with the presence of the internal/external biliary drain and could be a posttraumatic finding, although superimposed infection cannot be excluded. 4 cm abnormality posterior inferior right lobe liver was not present on 04/16/2017, it could be a focal infarct or focal fatty change and should be followed. 1.3 cm indeterminate finding left lobe should be followed on subsequent studies. 2 cm posterior right  lobe liver hemangioma again seen. 3. Slight decrease in the porta hepatis area hematoma which severely compresses the proximal main portal vein. Superimposed infection of this fluid cannot be excluded. Patient is post Whipple procedure. 4. Air in the urinary bladder. This can be due to a recent Foley catheter and/or infection. Recommend clinical correlation with urinalysis.  1.2 cm focus in the mid left kidney is likely focal pyelonephritis and can be followed. 5. Small foci in the left upper quadrant and dependent right peritoneum could be postoperative changes, small areas of infection or tumor cannot be excluded and these can be followed. Also see chest CT and its separate report. Findings were discussed with Floreen Comber, NP. END OF IMPRESSION    Ct Chest With Contrast    Result Date: 05/03/2017  1.  Moderate left pleural fluid collection, new when compared with prior imaging. This is associated with collapse of the left lower lobe, secondary to compressive atelectasis. 2.  Segmental collapse of the posterior right lower lobe, with possible superimposed aspiration component.  Please note that there are retained oral secretions in the trachea. 3.  Subtle groundglass opacities noted in the right upper lobe, possibly infectious/inflammatory in etiology. 4.  Additional areas of subsegmental atelectasis present in the right middle lobe and lingula. 5.  3 mm fissural based nodule the right minor fissure, possibly representing a intrapulmonary lymph node, no clearly seen on prior imaging, possibly reactive. Please see the separately dictated CT abdomen and pelvis imaging report for detailed findings below the level the diaphragm.  END OF IMPRESSION [facility] submits this DICOM format image data and final report to the [facility], an independent secure electronic health information exchange, on a reciprocally searchable basis (with patient authorization) for a minimum of 12 months after exam date.    Ir Percutaneous  Drainage    Result Date: 05/03/2017  Successful ultrasound-guided drainage of the left upper quadrant collection tracking across the midline into a left perihepatic collection. A 10 [place] APDL catheter was placed. END OF IMPRESSION          Microbiology:  1/25: Intraoperative bile : NTD    2/12: 1 set of blood cultures from the right Mediport were sent (no peripheral were sent): positive for Enterobacter cloacae complex in 70 hrs, sensitive to zosyn.     2/18 abdominal GS had >25 PMNs, many GPC in pairs/chains, many GNB and cultures 4+ Enterobacter cloacae complex. The cultures also grew 1+ Candida glabrata     2/21: BCx from the Right peripheral IV, mediport, L IJ are  NTD    05/03/17: IR-guided I&D with drain placement of the left upper quadrant collection: GS > 25 PMNs, many GNB and GN diplococci, Cultures with 4+ Enterobacter cloacae complex (resistant to Zosyn), C. Albicans and C. Glabrata, and another yeast    05/04/17:    1 set of blood cultures from left IJ: VRE at 15.3 TTP,   1 set from periphery: VRE and Enterobacter cloacae at 14.8 TTP     05/05/17:  IR-guided I&D with drain placement of the intrahepatic collection: GS 1-10 PMNs, very few GPC in pairs. Cultures pending.       Assessment:    - Multiple intraabdominal abscesses with Enterobacter cloacae complex bacteremia and fungi, now with drain placement in hopes of source control. The CT from 05/03/17 mentions possible bowel perforation given the gas in the abscesses.   - VRE and Enterobacter cloacae bacteremia due to the intra-abdominal source  - Patient at high risk for Fungemia being on TPN  - Left sided pleural effusion  - Pancreatic cancer s/p recent Whipple's on 03/29/17  - Recent cholangitis due to hematoma, with right hepatic duct drain placement      RECOMMENDATIONS:  - The Enterobacter is expectedly now resistant to Zosyn (amp-C inducible). Hence was advised on 05/04/17 to be switched to ertapenem. Continue ertapenem  - Start daptomycin at 66m/kg. We  will ask micro lab to perform susceptibilities to the daptomycin as well.   -  Continue caspofungin  - Obtain repeat BCx from from CVC, mediport and peripherals  - F/u cultures      ID will follow     Patient seen and examined with ID attending, Dr TClide Cliff          Page with Qs.      SRosina Lowenstein MD  Infectious Diseases fellow, PGY-4  Pager #[phone]

## 2017-05-05 NOTE — Progress Notes (Signed)
Surgical Oncology/Hepatobiliary Surgery Progress Note     LOS: 37 days     Subjective:  Afebrile overnight  Oriented to self, place, not oriented to year.  Denies CP, SOB, N/V  Did not ambulate yesterday  D/C zosyn, added ertapenem per ID recs due to VRE bacteremia  On TPN  Having bowel movements and passing flatus  Small amount of oral intake on regular diet     Objective:    Vitals Sign Ranges for Past 24 Hours:  BP: (110-132)/(58-77)   Temp:  [36.1 C (97 F)-37.8 C (100 F)]   Temp src: Oral (03/03 0450)  Heart Rate:  [78-103]   Resp:  [18-22]   SpO2:  [92 %-98 %]       Physical Exam:    General Appearance: in NAD, speaking slowly/quietly  Cardiac: regular rate  Respiratory: non-labored breathing on room air  Abdomen: soft, non-tender, distended, pre-existing ecchymosis, biliary tube to clamp, LUQ drain with purulent output.  Extremities: warm    Labs:    CBC:    Recent Labs  Lab 05/04/17  0515 05/03/17  0012 05/02/17  0042 05/01/17  0017 04/30/17  0034 04/29/17  0031   WBC 13.1* 13.7* 10.9* 11.4* 12.6* 12.5*   Hemoglobin 7.4* 7.0* 7.2* 7.5* 7.9* 8.4*   Hematocrit 24* 22* 23* 23* 24* 27*   Platelets 565* 597* 620* 611* 596* 983*       Metabolic Panel:    Recent Labs  Lab 05/04/17  0515 05/03/17  0012 05/02/17  0042 05/01/17  0017 04/30/17  0034 04/29/17  0031   Sodium 131* 128* 129* 128* 129* 130*   Potassium 4.3 3.8 4.4 4.2 4.4 4.7   Chloride 97 94* 95* 95* 93* 92*   CO2 17* _0 UN 19 23* 22* 24* 26* 26*   Creatinine 0.68 0.69 0.67 0.64 0.69 0.67   Glucose 137* 144* 112* 127* 108* 104*   Calcium 8.2* 8.4* 8.3* 8.0* 8.3* 8.6   Magnesium  --  1.9 2.0 2.0 1.9 2.0   Phosphorus  --  3.6 3.1 3.5 3.3 3.1          Assessment:    72 y.o. female with h/o recent PE and pancreatic cancer POD # 37  status post whipple procedure complicated by delayed gastric emptying.  NGT replaced on 04/04/17. UGI on 04/12/17 concerning for obstruction just beyond the [place] in the efferent limb.  Course complicated on 3/82/50 by  rising LFTs and sepsis with CT concerning for cholangitis given biliary tree obstruction and PV compression due to hematoma and afferent limb distention in setting of PE treatment. Is s/p IR PTC on 04/16/17 and IR percutaneous aspiration of anterior abdominal fluid collection on 04/24/17. IR LUQ perc drain placement 3/1.      Plan:  - Continue regular diet, calorie counts  - Continue TPN  - Aggressive bowel regimen   - Methylnaltrexone  - Continue ertapenem/caspo, f/u additional culture results, appreciate ID recs  - Continue capped biliary drain  - SCDs, heparin SC (hold therapeutic anticoagulation) for VTE ppx  - Dispo: pending clinical course    Cristie Hem, MD  05/05/2017     6:26 AM   General Surgery Resident

## 2017-05-05 NOTE — Interval H&P Note (Signed)
UPDATES TO PATIENT'S CONDITION on the DAY OF SURGERY/PROCEDURE    I. Updates to Patient's Condition (to be completed by a provider privileged to complete a H&P, following reassessment of the patient by the provider):    Day of Surgery/Procedure Update:  History  History reviewed and no change    Physical  Physical exam updated and no change            II. Procedure Readiness   I have reviewed the patient's H&P and updated condition. By completing and signing this form, I attest that this patient is ready for surgery/procedure.    III. Attestation   I have reviewed the updated information regarding the patient's condition and it is appropriate to proceed with the planned surgery/procedure.    Maryanna Shape, MD as of 11:42 AM 05/05/2017

## 2017-05-06 LAB — EKG 12-LEAD
P: -6 deg
PR: 172 ms
QRS: 23 deg
QRSD: 62 ms
QT: 336 ms
QTc: 430 ms
Rate: 98 {beats}/min
Severity: NORMAL
T: 21 deg

## 2017-05-06 LAB — CBC AND DIFFERENTIAL
Baso # K/uL: 0 10*3/uL (ref 0.0–0.1)
Basophil %: 0 %
Eos # K/uL: 0.5 10*3/uL — ABNORMAL HIGH (ref 0.0–0.4)
Eosinophil %: 4.4 %
Hematocrit: 21 % — ABNORMAL LOW (ref 34–45)
Hemoglobin: 6.7 g/dL — ABNORMAL LOW (ref 11.2–15.7)
Lymph # K/uL: 1.3 10*3/uL (ref 1.2–3.7)
Lymphocyte %: 10.5 %
MCH: 30 pg/cell (ref 26–32)
MCHC: 32 g/dL (ref 32–36)
MCV: 93 fL (ref 79–95)
Mono # K/uL: 0.4 10*3/uL (ref 0.2–0.9)
Monocyte %: 3.5 %
Neut # K/uL: 8.9 10*3/uL — ABNORMAL HIGH (ref 1.6–6.1)
Nucl RBC # K/uL: 0 10*3/uL (ref 0.0–0.0)
Nucl RBC %: 0.1 /100 WBC (ref 0.0–0.2)
Platelets: 512 10*3/uL — ABNORMAL HIGH (ref 160–370)
RBC: 2.3 MIL/uL — ABNORMAL LOW (ref 3.9–5.2)
RDW: 19 % — ABNORMAL HIGH (ref 11.7–14.4)
Seg Neut %: 76.3 %
WBC: 11.4 10*3/uL — ABNORMAL HIGH (ref 4.0–10.0)

## 2017-05-06 LAB — COMPREHENSIVE METABOLIC PANEL
ALT: 79 U/L — ABNORMAL HIGH (ref 0–35)
AST: 62 U/L — ABNORMAL HIGH (ref 0–35)
Albumin: 2.6 g/dL — ABNORMAL LOW (ref 3.5–5.2)
Alk Phos: 412 U/L — ABNORMAL HIGH (ref 35–105)
Anion Gap: 13 (ref 7–16)
Bilirubin,Total: 0.3 mg/dL (ref 0.0–1.2)
CO2: 21 mmol/L (ref 20–28)
Calcium: 8.3 mg/dL — ABNORMAL LOW (ref 8.6–10.2)
Chloride: 97 mmol/L (ref 96–108)
Creatinine: 0.59 mg/dL (ref 0.51–0.95)
GFR,Black: 107 *
GFR,Caucasian: 93 *
Glucose: 122 mg/dL — ABNORMAL HIGH (ref 60–99)
Lab: 21 mg/dL — ABNORMAL HIGH (ref 6–20)
Potassium: 3.9 mmol/L (ref 3.3–5.1)
Sodium: 131 mmol/L — ABNORMAL LOW (ref 133–145)
Total Protein: 6.9 g/dL (ref 6.3–7.7)

## 2017-05-06 LAB — DIFF MANUAL
Bands %: 2 % (ref 0–10)
Diff Based On: 114 CELLS
Metamyelocyte %: 3 % — ABNORMAL HIGH (ref 0–1)
Myelocyte %: 1 % — ABNORMAL HIGH (ref 0–0)

## 2017-05-06 LAB — MAGNESIUM: Magnesium: 2.1 mg/dL (ref 1.6–2.5)

## 2017-05-06 LAB — GRAM STAIN: Gram Stain: 0

## 2017-05-06 LAB — TYPE AND SCREEN
ABO RH Blood Type: A NEG
Antibody Screen: NEGATIVE

## 2017-05-06 LAB — POCT GLUCOSE
Glucose POCT: 124 mg/dL — ABNORMAL HIGH (ref 60–99)
Glucose POCT: 128 mg/dL — ABNORMAL HIGH (ref 60–99)
Glucose POCT: 122 mg/dL — ABNORMAL HIGH (ref 60–99)

## 2017-05-06 LAB — PREALBUMIN: Prealbumin: 11 mg/dL — ABNORMAL LOW (ref 20–40)

## 2017-05-06 LAB — PROTIME-INR
INR: 1.2 — ABNORMAL HIGH (ref 0.9–1.1)
Protime: 13.6 s — ABNORMAL HIGH (ref 10.0–12.9)

## 2017-05-06 LAB — TRIGLYCERIDES: Triglycerides: 192 mg/dL — AB

## 2017-05-06 LAB — PHOSPHORUS: Phosphorus: 3.6 mg/dL (ref 2.7–4.5)

## 2017-05-06 LAB — AEROBIC CULTURE

## 2017-05-06 LAB — RBC MORPHOLOGY

## 2017-05-06 MED ORDER — STERILE WATER FOR INJECTION (TPN USE ONLY) *I*
INTRAVENOUS | Status: AC
Start: 2017-05-06 — End: 2017-05-07
  Filled 2017-05-06: qty 717.6

## 2017-05-06 MED ORDER — DULOXETINE HCL 20 MG PO CPEP *I*
20.0000 mg | DELAYED_RELEASE_CAPSULE | Freq: Every day | ORAL | Status: DC
Start: 2017-05-06 — End: 2017-06-25
  Administered 2017-05-06 – 2017-06-17 (×40): 20 mg via ORAL
  Filled 2017-05-06 (×51): qty 1

## 2017-05-06 MED ORDER — FAT EMULSION SOYBEAN OIL 20 % IV EMUL *WRAPPED*
60.0000 g | Freq: Every day | INTRAVENOUS | Status: AC
Start: 2017-05-06 — End: 2017-05-07
  Administered 2017-05-06: 60 g via INTRAVENOUS
  Filled 2017-05-06: qty 300

## 2017-05-06 MED ORDER — CALCIUM GLUCONATE 9.4 MEQ (2,000 MG) IN NS 110 ML *I*
9.4000 meq | Freq: Once | INTRAVENOUS | Status: AC
Start: 2017-05-06 — End: 2017-05-06
  Administered 2017-05-06: 9.4 meq via INTRAVENOUS
  Filled 2017-05-06: qty 100

## 2017-05-06 MED ORDER — FAT EMULSION SOYBEAN OIL 20 % IV EMUL *WRAPPED*
60.0000 g | Freq: Every day | INTRAVENOUS | Status: DC
Start: 2017-05-06 — End: 2017-05-06

## 2017-05-06 MED ORDER — STERILE WATER FOR INJECTION (TPN USE ONLY) *I*
INTRAVENOUS | Status: DC
Start: 2017-05-06 — End: 2017-05-06

## 2017-05-06 NOTE — Progress Notes (Addendum)
Surgical Oncology/Hepatobiliary Surgery Progress Note     LOS: 38 days     Subjective:  Afebrile overnight  Improved mental status  Denies N/V  Did not ambulate yesterday  On TPN  Having bowel movements and passing flatus  No PO intake on regular diet     Objective:    Vitals Sign Ranges for Past 24 Hours:  BP: (106-160)/(60-90)   Temp:  [35.6 C (96.1 F)-36.4 C (97.5 F)]   Temp src: Temporal (03/04 0406)  Heart Rate:  [78-94]   Resp:  [16-20]   SpO2:  [93 %-100 %]       Physical Exam:    General Appearance: in NAD, speaking slowly/quietly  Cardiac: regular rate  Respiratory: non-labored breathing on room air  Abdomen: soft, non-tender, distended, pre-existing ecchymosis, biliary tube to clamp, LUQ drain with purulent output. Anterior drain with bloody/purulent output.  Extremities: warm    Labs:    CBC:    Recent Labs  Lab 05/05/17  2329 05/05/17  0617 05/04/17  0515 05/03/17  0012 05/02/17  0042 05/01/17  0017   WBC 11.4* 12.1* 13.1* 13.7* 10.9* 11.4*   Hemoglobin 6.7* 6.6* 7.4* 7.0* 7.2* 7.5*   Hematocrit 21* 21* 24* 22* 23* 23*   Platelets 512* 525* 565* 597* 620* 621*       Metabolic Panel:    Recent Labs  Lab 05/05/17  2329 05/05/17  0617 05/04/17  0515 05/03/17  0012 05/02/17  0042 05/01/17  0017 04/30/17  0034   Sodium 131* 131* 131* 128* 129* 128* 129*   Potassium 3.9 3.7 4.3 3.8 4.4 4.2 4.4   Chloride 97 98 97 94* 95* 95* 93*   CO2 21 21 17* _0 UN 21* 21* 19 23* 22* 24* 26*   Creatinine 0.59 0.65 0.68 0.69 0.67 0.64 0.69   Glucose 122* 130* 137* 144* 112* 127* 108*   Calcium 8.3* 8.2* 8.2* 8.4* 8.3* 8.0* 8.3*   Magnesium 2.1 1.8  --  1.9 2.0 2.0 1.9   Phosphorus 3.6 3.1  --  3.6 3.1 3.5 3.3          Assessment:    72 y.o. female with h/o recent PE and pancreatic cancer POD # 38  status post whipple procedure complicated by delayed gastric emptying.  NGT replaced on 04/04/17. UGI on 04/12/17 concerning for obstruction just beyond the [place] in the efferent limb.  Course complicated on 05/10/69 by  rising LFTs and sepsis with CT concerning for cholangitis given biliary tree obstruction and PV compression due to hematoma and afferent limb distention in setting of PE treatment. Is s/p IR PTC on 04/16/17 and IR percutaneous aspiration of anterior abdominal fluid collection on 04/24/17. IR LUQ perc drain placement 3/1.    Plan:  - Continue regular diet, calorie counts  - Continue TPN  - Aggressive bowel regimen  - Methylnaltrexone  - Continue ertapenem/caspo/dapto, f/u additional culture results, appreciate ID recs  - Continue capped biliary drain  - SCDs, heparin SC (hold therapeutic anticoagulation) for VTE ppx  - Dispo: pending clinical course, IR drains    Cristie Hem, MD  05/06/2017     6:33 AM   General Surgery Resident      HPB-GI Attending Addendum:   I personally examined the patient, reviewed the notes, and discussed the plan of care with the residents and patient. Agree with detailed resident's note. Please see the note above for details of history, exam, labs,  assessment/plan which reflect my input.      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 Abdominal abscess on imaging. Drains placed with purulent drainage. SIRS without sepsis. Improving.      Anorexia. Encouraging oral intake.   Hyperglycemia due to TPN. Insulin sliding scale.   Post operative anemia. No evidence of bleeding. Will begin iron replacement.  Bilateral pleural effusions with lung compression. Left greater than right. Monitor for now.  Pulmonary embolism. Present on admission. No evidence of worsening.  Continue to hold anticoagulation.  Hypoalbuminemia. Malnutrition. TPN. Encourage oral intake. Reglan started. Nutritional supplementation.  Depression. Encouragement provided. Trial of new anti-depressant therapy.  Counseling. Daily encouragement.  Leukocytosis. VRE. Antibiotics transitioned due to resistant organisms.    Constipation . Aggressive regimen. Will give trial of methylnaloxone.      Delano Metz, MD, FACS  Hepatobiliary, Pancreas & GI Surgery

## 2017-05-06 NOTE — Progress Notes (Signed)
Assumed care of pt 0700-1900. Pt VSS. IV's are patent. Pt is withdrawn and have low self confidence in ambulation. Writer encouraged and pt was able to ambulate from bed to scale to room recliner. Dilaudid was given to pt and have positive effects. Pt refused enema and lactulose.for more information please refer to pt chart. Will continue to monitor.  Karmen Stabs, RN

## 2017-05-06 NOTE — Consults (Signed)
Medical Nutrition Therapy Brief Note: TPN check    Patient remains on TPN for nutrition support. Chart, labs, medications / TPN order, I/Os reviewed. Now s/p [name] and CT-guided drainage. Na 131 (L), K+ 3.9 (WNL), Cl 97 (WNL), CO2 21 (WNL), Ca 9.42 (WNL corrected for low albumin), PO4 3.6 (WNL), Mg 2.1 (WNL). Recent t bili 0.3 (WNL) from 3/3. BGs ranging from 105 - 140 over the past 24 hours- 12 units insulin given. A phos trending up, 412. ALT and AST elevated, 79 and 62 respectively. MAR reviewed with repletions noted for Ca gluconate, Mg sulfate. I/Os significant for 200 ml PO, -2300 ml UOP, 50 ml drain output. Current TPN order reviewed - please see recommendations below for changes. PO intake still poor, recommend to cycle TPN as it may increase appetite.       Nutrition Intervention:   Please cycle TPN down to a 12 hour infusion as follows:     Volume (Amino Acid/Dextrose solution): 1560 mL/day   Rate:  DAY 1 (20 hrs): 80 mL/hr x 19 hrs then taper to 40 mL/hr x 1 hr    DAY 2 (16 hrs): 101 mL/hr x 15 hrs then taper to 45 mL/hr x 1 hr    DAY 3 (12 hrs): 136 mL/hr x 11 hrs then taper to 64 mL/hr x 1 hr   Amino acids: 69 g/L    Dextrose: 173 g/L    Standard additives except the following:      Na: 119mEq/L  K: 26mEq/L  Ca: 8mEq/L  Mg: 70mEq/L  PO4: 82mmol/L  200 mg levocarnitine     Maximize Chloride    Insulin: 0 units/LITER   Fat Emulsion 20% infusion: 60 gm/day (to be infused over 12 hrs)   -TPN provides: 1948 kcal/day, 108 g protein/day, 270 g dextrose/day and 1860 mL  fluid/day (including amino acid/dextrose solution and lipid volume).   Continue to monitor fluid balance and provide non-dextrose containing IVF to replace any extraordinary losses.  Please check BG 1 hr into TPN infusion, 2 hrs before D/C of TPN and 30 minutes  after TPN infusion is complete.  Continue other TPN monitoring parameters.        Will continue to monitor.     Amanda Galloway, Amanda Galloway  Pager [phone]

## 2017-05-06 NOTE — Progress Notes (Signed)
05/06/17 1200   [facility] Patient Class Review   Patient Class Review Inpatient   Patient Class Effective 03/29/2017  Earnest Conroy RN   Utilization  Management  X [phone]  Page [phone]

## 2017-05-06 NOTE — Progress Notes (Signed)
Palliative Care Progress Note    HPI: [name] w/ PMH fibromyalgia, pancreatic CA s/p Whipple for 'cure' 03/29/17; s/p ICU w/ intub for resp failure & septic shock, weaned from pressors and extubated to HFNC 2/17 w/ IR drainage bili fluid collection s/p abx.  Palliative following for symptoms, started methadone 2/19, weaned dose 2/23 d/t concern for sedation.     Interval events: Found to have increase in size of hepatic abscess. IR placed drain with 60cc of pus removed. Amanda Galloway continues to have pain and reports feeling "like a [employer] truck hit me... Over and over and over again." She doesn't feel like her pain overall is better than Saturday, currently rating it a 4.     Palliative Care ROS:  Pain is moderate  Mood is depressed  Anxiety is moderate  Anorexia is severe  Loss of quality of life is severe  Sleep is poor    Last stool 3/4 x 1    Physical Examination:   BP: (120-160)/(70-90)   Temp:  [35.6 C (96.1 F)-36.4 C (97.5 F)]   Temp src: Temporal (03/04 1412)  Heart Rate:  [78-94]   Resp:  [16-18]   SpO2:  [95 %-97 %]   Weight:  [65.8 kg (145 lb 1.6 oz)]   Gen appearance: [name], sitting in chair, drowsy and   Lungs: easy resp, RA  Heart: reg  Abd: bruised, distended, 2 drains in place c/d/i, exquisitely tender to light paplation  Extrem: LE edema  Skin: pale  Neuro: A+O x 3 with brief periods of delirium where she thought she saw cats in the room     Assessment/Plan: [name] w/ pancreatic CA, s/p Whipple in Jan. remains on TPN, now receiving IV abx s/p IR bili fluid drainage and recent KUB revealing need for bowel clean out of stool, being treated w/ every other day methylnaltrexone w/ + results. Pt has used total 4 mg IV dilaudid prn over last 24 hrs and now sleeping comfortably, her dtr reporting pt's pain overall improved, anxiety better s/p ativan.      Pain/dyspnea  Acetaminophen 1 gm po TID  Methadone 5 mg po TID (dose weaned 2/23, may consider increasing dose if pt still w/ uncontrolled  pain, given new acute issue would observe for another 24-48 hours with second drain in place)  Dilaudid 0.5 mg IV Q 1 hr prn (x 2 on 3/4)    Dilaudid 1 mg IV Q 1 hr prn (x 6 on 3/4)  Lidocaine patch daily  Encourage incentive spirometry prn     Anxiety/agitation/nausea  Amitriptyline 25 mg po QHS  Escitalopram 20 mg po QHS - would consider transitioning to remeron   Reglan 5 mg po 4x/d (AC & QHS)  Pantoprazole 40 mg po daily  Lorazepam 0.5 mg po Q 6 hrs prn  Ondansetron 4 mg IV Q 6 hrs prn     Prevention of constipation  Colace 200 mg po BID  Lactulose 20 gm po TID  Miralax 17 gm po BID  Senna 1 tab po BID  Methylnaltrexone 12 mg SC every other day (started 2/26)  Dulcolax supp x 1 today  Reviewed splinting pelvic floor muscles when attempting to defecate for additional support     Prevention of urinary retention  Consider bladder scan prn    GOC/prognosis  Full code  Dtr Rod Mae is HCP    Total Time Spent 35 minutes: >50% of time was spent in counseling and/or coordination of  care.     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    Hypothyroidism E03.9    Pancreatic cancer C25.9    Anemia D64.9     hx of Pulmonary embolism I26.99    Depression F32.9    Sepsis A41.9       No Known Allergies (drug, envir, food or latex)    Scheduled Meds:    daptomycin IV  6 mg/kg Intravenous Q24H    ertapenem  1,000 mg Intravenous Q24H    metoclopramide  10 mg Oral 4x Daily AC & HS    lidocaine  1 patch Transdermal Q24H    methylnaltrexone  12 mg Subcutaneous Every Other Day    senna  1 tablet Oral 2 times per day    ferrous gluconate  324 mg Oral Daily with breakfast    ascorbic acid  500 mg Oral Daily with breakfast    lactulose  20 g Oral TID    docusate sodium  200 mg Oral 2 times per  day    polyethylene glycol  17 g Oral 2 times per day    pantoprazole  40 mg Oral QAM    caspofungin  50 mg Intravenous Q24H    acetaminophen  1,000 mg Oral Q8H    methadone  5 mg Oral Q8H    heparin (porcine)  5,000 Units Subcutaneous Q8H    insulin regular  0-20 Units Subcutaneous Q6H    levothyroxine  137 mcg Oral Daily    amitriptyline  25 mg Oral Nightly    escitalopram  20 mg Oral Nightly       Continuous Infusions:    TPN (2 in 1) Adult Cyclical      And    fat emulsion soybean oil      TPN (2 in 1) Adult Continuous 65 mL/hr at 05/05/17 1857       PRN Meds:  HYDROmorphone PF **OR** HYDROmorphone PF, LORazepam, heparin lock flush, ondansetron

## 2017-05-06 NOTE — Plan of Care (Signed)
Problem: Safety  Goal: Patient will remain free of falls  Outcome: Progressing towards goal      Problem: Pain/Comfort  Goal: Patient's pain or discomfort is manageable  Outcome: Progressing towards goal      Problem: Mobility  Goal: Patient's functional status is maintained or improved  Outcome: Progressing towards goal      Problem: Nutrition  Goal: Patient's nutritional status is maintained or improved  Outcome: Maintaining      Problem: Cognitive function  Goal: Cognitive function will be maintained  Outcome: Maintaining    Goal: Cognitive function will be maintained or return to baseline  Outcome: Maintaining      Problem: Bowel Elimination  Goal: Elimination patterns are normal or improving  Outcome: Progressing towards goal      Problem: Psychosocial  Goal: Demonstrates ability to cope with illness  Outcome: Progressing towards goal

## 2017-05-06 NOTE — Progress Notes (Signed)
SW into see pt and daughter. PT is now recommending SNF rehab upon discharge.    Pt lives with daughter and is requiring more assistance at this time.    Pt and daughter agreeable to SNF plan for rehab. They were provided with SNF packet and will complete and return to SW.    Uncertain what discharge date team has in mind at this time.  SW to continue to follow and assist as needed.

## 2017-05-06 NOTE — Plan of Care (Signed)
Problem: Pain/Comfort  Goal: Patient's pain or discomfort is manageable  Outcome: Maintaining      Problem: Mobility  Goal: Patient's functional status is maintained or improved  Outcome: Maintaining      Problem: Cognitive function  Goal: Cognitive function will be maintained  Outcome: Maintaining    Goal: Cognitive function will be maintained or return to baseline  Outcome: Maintaining      Problem: Bowel Elimination  Goal: Elimination patterns are normal or improving  Outcome: Progressing towards goal      Problem: Post-Operative Hemodynamic Stability  Goal: Maintain Hemodynamic Stability  Outcome: Maintaining      Problem: Post-Operative Complications  Goal: Patient will remain free from symptoms of infection-post op  Outcome: Maintaining    Goal: Prevent post-operative complications  Outcome: Maintaining      Problem: GI Bleeding Elimination  Goal: Elimination of patterns are normal or improving  Outcome: Progressing towards goal

## 2017-05-07 ENCOUNTER — Inpatient Hospital Stay: Payer: Medicare (Managed Care)

## 2017-05-07 LAB — POCT GLUCOSE
Glucose POCT: 116 mg/dL — ABNORMAL HIGH (ref 60–99)
Glucose POCT: 128 mg/dL — ABNORMAL HIGH (ref 60–99)
Glucose POCT: 133 mg/dL — ABNORMAL HIGH (ref 60–99)
Glucose POCT: 138 mg/dL — ABNORMAL HIGH (ref 60–99)
Glucose POCT: 83 mg/dL (ref 60–99)
Glucose POCT: 91 mg/dL (ref 60–99)

## 2017-05-07 LAB — CBC AND DIFFERENTIAL
Baso # K/uL: 0.1 10*3/uL (ref 0.0–0.1)
Basophil %: 0.9 %
Eos # K/uL: 0.5 10*3/uL — ABNORMAL HIGH (ref 0.0–0.4)
Eosinophil %: 4.5 %
Hematocrit: 20 % — ABNORMAL LOW (ref 34–45)
Hemoglobin: 6.3 g/dL — ABNORMAL LOW (ref 11.2–15.7)
Lymph # K/uL: 2 10*3/uL (ref 1.2–3.7)
Lymphocyte %: 17 %
MCH: 29 pg/cell (ref 26–32)
MCHC: 32 g/dL (ref 32–36)
MCV: 91 fL (ref 79–95)
Mono # K/uL: 0.1 10*3/uL — ABNORMAL LOW (ref 0.2–0.9)
Monocyte %: 0.9 %
Neut # K/uL: 8.9 10*3/uL — ABNORMAL HIGH (ref 1.6–6.1)
Nucl RBC # K/uL: 0 10*3/uL (ref 0.0–0.0)
Nucl RBC %: 0.3 /100 WBC — ABNORMAL HIGH (ref 0.0–0.2)
Platelets: 484 10*3/uL — ABNORMAL HIGH (ref 160–370)
RBC: 2.2 MIL/uL — ABNORMAL LOW (ref 3.9–5.2)
RDW: 19.2 % — ABNORMAL HIGH (ref 11.7–14.4)
Seg Neut %: 69.5 %
WBC: 11.8 10*3/uL — ABNORMAL HIGH (ref 4.0–10.0)

## 2017-05-07 LAB — COMPREHENSIVE METABOLIC PANEL
ALT: 66 U/L — ABNORMAL HIGH (ref 0–35)
AST: 46 U/L — ABNORMAL HIGH (ref 0–35)
Albumin: 2.2 g/dL — ABNORMAL LOW (ref 3.5–5.2)
Alk Phos: 425 U/L — ABNORMAL HIGH (ref 35–105)
Anion Gap: 13 (ref 7–16)
Bilirubin,Total: 0.2 mg/dL (ref 0.0–1.2)
CO2: 21 mmol/L (ref 20–28)
Calcium: 8.4 mg/dL — ABNORMAL LOW (ref 8.6–10.2)
Chloride: 100 mmol/L (ref 96–108)
Creatinine: 0.56 mg/dL (ref 0.51–0.95)
GFR,Black: 109 *
GFR,Caucasian: 94 *
Glucose: 165 mg/dL — ABNORMAL HIGH (ref 60–99)
Lab: 23 mg/dL — ABNORMAL HIGH (ref 6–20)
Potassium: 4 mmol/L (ref 3.3–5.1)
Sodium: 134 mmol/L (ref 133–145)
Total Protein: 6.4 g/dL (ref 6.3–7.7)

## 2017-05-07 LAB — MCHC: MCHC: 32 g/dL (ref 32–36)

## 2017-05-07 LAB — MAGNESIUM: Magnesium: 1.8 mg/dL (ref 1.6–2.5)

## 2017-05-07 LAB — DIFF MANUAL
Bands %: 5 % (ref 0–10)
Diff Based On: 112 CELLS
Metamyelocyte %: 2 % — ABNORMAL HIGH (ref 0–1)
Myelocyte %: 1 % — ABNORMAL HIGH (ref 0–0)

## 2017-05-07 LAB — BLOOD CULTURE

## 2017-05-07 LAB — RBC MORPHOLOGY

## 2017-05-07 LAB — PHOSPHORUS: Phosphorus: 4 mg/dL (ref 2.7–4.5)

## 2017-05-07 LAB — ANAEROBIC CULTURE: Anaerobic Culture: 0

## 2017-05-07 LAB — HEMATOCRIT: Hematocrit: 26 % — ABNORMAL LOW (ref 34–45)

## 2017-05-07 LAB — AEROBIC CULTURE: Aerobic Culture: 0

## 2017-05-07 MED ORDER — STERILE WATER FOR IRRIGATION IR SOLN *I*
900.0000 mL | Freq: Once | Status: AC
Start: 2017-05-07 — End: 2017-05-07
  Administered 2017-05-07: 900 mL via ORAL

## 2017-05-07 MED ORDER — VITAMIN C 500 MG PO TABS *I*
500.0000 mg | ORAL_TABLET | Freq: Every day | ORAL | Status: AC
Start: 2017-05-07 — End: 2017-05-13
  Administered 2017-05-07 – 2017-05-13 (×7): 500 mg via ORAL
  Filled 2017-05-07 (×7): qty 1

## 2017-05-07 MED ORDER — IOHEXOL 350 MG/ML (OMNIPAQUE) IV SOLN *I*
1.0000 mL | Freq: Once | INTRAVENOUS | Status: AC
Start: 2017-05-07 — End: 2017-05-07
  Administered 2017-05-07: 98 mL via INTRAVENOUS

## 2017-05-07 MED ORDER — INSULIN GLARGINE 100 UNIT/ML SC SOLN *WRAPPED*
12.0000 [IU] | Freq: Every evening | SUBCUTANEOUS | Status: DC
Start: 2017-05-07 — End: 2017-05-17
  Administered 2017-05-07 – 2017-05-16 (×10): 12 [IU] via SUBCUTANEOUS

## 2017-05-07 MED ORDER — STERILE WATER FOR INJECTION (TPN USE ONLY) *I*
INTRAVENOUS | Status: AC
Start: 2017-05-07 — End: 2017-05-08
  Filled 2017-05-07: qty 717.6

## 2017-05-07 MED ORDER — SODIUM CHLORIDE 0.9 % IV SOLN WRAPPED *I*
3.0000 mL/h | Status: DC
Start: 2017-05-07 — End: 2017-05-20
  Administered 2017-05-07: 3 mL/h via INTRAVENOUS

## 2017-05-07 MED ORDER — SODIUM CHLORIDE 0.9 % IV SOLN WRAPPED *I*
6.0000 mg/kg | INTRAVENOUS | Status: DC
Start: 2017-05-07 — End: 2017-05-08
  Administered 2017-05-07 – 2017-05-08 (×2): 400 mg via INTRAVENOUS
  Filled 2017-05-07 (×2): qty 8

## 2017-05-07 MED ORDER — FAT EMULSION SOYBEAN OIL 20 % IV EMUL *WRAPPED*
60.0000 g | Freq: Every day | INTRAVENOUS | Status: AC
Start: 2017-05-07 — End: 2017-05-08
  Administered 2017-05-07: 60 g via INTRAVENOUS
  Filled 2017-05-07: qty 300

## 2017-05-07 MED ORDER — MAGNESIUM SULFATE 2 GM IN 50 ML *WRAPPED*
2000.0000 mg | Freq: Once | INTRAVENOUS | Status: AC
Start: 2017-05-07 — End: 2017-05-07
  Administered 2017-05-07: 2000 mg via INTRAVENOUS
  Filled 2017-05-07: qty 50

## 2017-05-07 NOTE — Progress Notes (Signed)
Hinton Lovely, RN received handoff report from Amy on wcc5 unit for CT scan in [facility] 05/07/17 2:37 PM.

## 2017-05-07 NOTE — Progress Notes (Addendum)
ID Progress Note    Interval:  None acute  Afebrile, HDS  Abdominal drains with following output: 90 and 30cc only over 05/06/17.     Subjective:    Patient continues to report persistent abdominal pain, anorexia, sickness.     Objective:    Temp:  [36 C (96.8 F)-36.4 C (97.5 F)] 36 C (96.8 F)  Heart Rate:  [76-100] 85  Resp:  [16-18] 16  BP: (116-154)/(62-80) 116/62    PHYSICAL EXAM  No major change since prior  General: Sitting in chair, very uncomfortable and very tired  appearing, cooperative  Lines: Left IJ CDI, Right chest Mediport de-accessed, PIVs  HEENT: Atraumatic head, EOMI  Cardiovascular: Regular rate and rhythm, no murmurs  Lungs: Breathing well on room air  Abdomen: Soft, tender, 2 left-sided drains in left abdominal area with minimal purulent drainage, and prior hepatic duct drain on right side  Neurologic: Tired  Extremities: No cyanosis or edema bilaterally    Labs:      Recent Labs  Lab 05/07/17  1152 05/07/17  0201 05/05/17  2329 05/05/17  0617   WBC  --  11.8* 11.4* 12.1*   Hemoglobin  --  6.3* 6.7* 6.6*   Hematocrit 26* 20* 21* 21*   Platelets  --  484* 512* 525*   Seg Neut %  --  69.5 76.3 83.4   Bands %  --  '5 2 1       ' Recent Labs  Lab 05/07/17  0201 05/05/17  2329 05/05/17  0617   Sodium 134 131* 131*   Potassium 4.0 3.9 3.7   Chloride 100 97 98   CO2 '21 21 21   ' UN 23* 21* 21*   Creatinine 0.56 0.59 0.65   Glucose 165* 122* 130*   Calcium 8.4* 8.3* 8.2*       Recent Labs  Lab 05/07/17  0201 05/05/17  2329 05/05/17  0617   Albumin 2.2* 2.6* 2.3*   Total Protein 6.4 6.9 6.4   ALT 66* 79* 90*   AST 46* 62* 73*   Alk Phos 425* 412* 403*     Recent Labs      05/06/17   0829   INR  1.2*       No results for input(s): ESR, CRP in the last 168 hours.     Recent Labs  Lab 05/04/17  0515   Lactate 1.6        Imaging:  Ir Percutaneous Drainage    Result Date: 05/06/2017  Successful CT and ultrasound-guided drainage of an anterior mid abdominal collection inferior to a previously drained upper  abdominal collection. A new 8 [place] APDL was placed in the collection. . Additional CT findings included marked interval diminution of the previously drained upper abdominal collection tracking from the left perihepatic distribution across the midline to the perisplenic distribution. END OF IMPRESSION           Microbiology:  1/25: Intraoperative bile : NTD    2/12: 1 set of blood cultures from the right Mediport were sent (no peripheral were sent): positive for Enterobacter cloacae complex in 70 hrs, sensitive to zosyn.     2/18 abdominal GS had >25 PMNs, many GPC in pairs/chains, many GNB and cultures 4+ Enterobacter cloacae complex. The cultures also grew 1+ Candida glabrata     2/21: BCx from the Right peripheral IV, mediport, L IJ are NTD    05/03/17: IR-guided I&D with drain placement of the left upper  quadrant collection: GS > 25 PMNs, many GNB and GN diplococci, Cultures with 4+ Enterobacter cloacae complex (resistant to Zosyn), C. Albicans and C. Glabrata, and Candida tropicalis    05/04/17:    1 set of blood cultures from left IJ: VRE at 15.3 TTP,   1 set from periphery: VRE and Enterobacter cloacae at 14.8 TTP     05/05/17:  IR-guided I&D with drain placement of the intrahepatic collection: GS 1-10 PMNs, very few GPC in pairs. Fungal cultures with Candida glabrata, other Cultures pending.     05/05/17: Blood cultures from periphery, mediport and IJ: NTD    05/04/17: Urine cultures obtained (for unclear reason): NTD    Assessment:    - Multiple intraabdominal abscesses with Enterobacter cloacae complex bacteremia and fungi, now with drain placement in hopes of source control. The CT from 05/03/17 mentions possible bowel perforation given the gas in the abscesses.   - VRE and Enterobacter cloacae bacteremia due to the intra-abdominal source  - Patient at high risk for Fungemia being on TPN  - Left sided pleural effusion  - Pancreatic cancer s/p recent Whipple's on 03/29/17  - Recent cholangitis due to hematoma, with  right hepatic duct drain placement      RECOMMENDATIONS:  - Continue ertapenem, daptomycin, caspofungin.   - We have asked micro lab to get susceptibilities on the daptomycin  - Please ask IR to evaluate the drain placement as the output from the drains has not been significant esp the drain in the bigger abscess which would be expected to bring out much more by now, as well as patient's significant persistent abdominal discomfort/pain    ID will follow     Patient seen and examined with ID attending, Dr Delphia Grates        Page with Qs.      Rosina Lowenstein, MD  Infectious Diseases fellow, PGY-4  Pager [phone]    ID Attending    I saw and evaluated the patient. I agree with the fellow's findings and plan of care as documented above.    Arnetha Gula, MD

## 2017-05-07 NOTE — Progress Notes (Signed)
Palliative Care Progress Note    HPI: [name] w/ PMH fibromyalgia, pancreatic CA s/p Whipple for 'cure' 03/29/17; s/p ICU w/ intub for resp failure & septic shock, weaned from pressors and extubated to HFNC 2/17 w/ IR drainage bili fluid collection s/p abx.  Palliative following for symptoms, started methadone 2/19, weaned dose 2/23 d/t concern for sedation. Found to have new intraabdominal abscess, IR placed drain on 3/3 with evacuation of significant amount of pus.     Interval events: Amanda Galloway reports that she is finally feeling better today from a pain standpoint. Her abdomen is less tender today and she has been able to eat her favorite mandarin oranges without pain. She received methylnaltrexone this morning and team started her on Cymbalta yesterday.     Dilaudid 0.5mg  IV Q1h PRN x2  Dilaudid 1mg  IV Q1h PRN x6    Palliative Care ROS:  Pain is moderate  Mood is depressed  Anxiety is moderate  Anorexia is severe  Loss of quality of life is severe  Sleep is poor    Last stool 3/4 x 1    Physical Examination:   BP: (116-154)/(62-80)   Temp:  [36 C (96.8 F)-36.4 C (97.5 F)]   Temp src: Temporal (03/05 1355)  Heart Rate:  [76-100]   Resp:  [16-18]   SpO2:  [95 %-98 %]   Gen appearance: sitting in chair, drowsy but interactive  Lungs: easy resp, RA  Heart: reg  Abd: bruised, distended, 2 drains in place c/d/i, tender to light paplation  Extrem: LE edema  Skin: pale  Neuro: A&O x 2    Assessment/Plan: [name] w/ pancreatic CA, s/p Whipple in Jan. Course has been complicated by gastroparesis/anorexia and TPN dependence, intraabdominal abscesses s/p drain placement, anxiety and depression, nausea and vomiting. Pain currently improving on current regimen so wouldn't make any changes at this time. Would consider daily ritalin for energy/mood if this doesn't improve over the next days and at low dose (5mg ) her appetite shouldn't be affected.      Pain/dyspnea  Acetaminophen 1 gm po TID  Methadone 5 mg po TID  Dilaudid  0.5 mg IV Q 1 hr prn   Dilaudid 1 mg IV Q 1 hr prn  Lidocaine patch daily    Anxiety/agitation/nausea  Amitriptyline 25 mg po QHS  Started on Cymbalta 20mg  daily 3/5  Reglan 5 mg po 4x/d (AC & QHS)  Pantoprazole 40 mg po daily  Lorazepam 0.5 mg po Q 6 hrs prn  Ondansetron 4 mg IV Q 6 hrs prn     Prevention of constipation  Colace 200 mg po BID  Lactulose 20 gm po TID  Miralax 17 gm po BID  Senna 1 tab po BID  Methylnaltrexone 12 mg SC every other day (started 2/26)  Dulcolax supp x 1 today  Reviewed splinting pelvic floor muscles when attempting to defecate for additional support     Prevention of urinary retention  Consider bladder scan prn    GOC/prognosis  Full code  Dtr Amanda Galloway is HCP    Total Time Spent 35 minutes: >50% of time was spent in counseling and/or coordination of care.     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    Hypothyroidism E03.9    Pancreatic cancer C25.9  Anemia D64.9     hx of Pulmonary embolism I26.99    Depression F32.9    Sepsis A41.9       No Known Allergies (drug, envir, food or latex)    Scheduled Meds:    daptomycin IV  6 mg/kg Intravenous Q24H    ascorbic acid  500 mg Oral Daily with breakfast    DULoxetine  20 mg Oral Daily    ertapenem  1,000 mg Intravenous Q24H    metoclopramide  10 mg Oral 4x Daily AC & HS    lidocaine  1 patch Transdermal Q24H    methylnaltrexone  12 mg Subcutaneous Every Other Day    senna  1 tablet Oral 2 times per day    ferrous gluconate  324 mg Oral Daily with breakfast    docusate sodium  200 mg Oral 2 times per day    polyethylene glycol  17 g Oral 2 times per day    pantoprazole  40 mg Oral QAM    caspofungin  50 mg Intravenous Q24H    acetaminophen  1,000 mg Oral Q8H    methadone  5 mg Oral Q8H     heparin (porcine)  5,000 Units Subcutaneous Q8H    insulin regular  0-20 Units Subcutaneous Q6H    levothyroxine  137 mcg Oral Daily    amitriptyline  25 mg Oral Nightly       Continuous Infusions:    sodium chloride Stopped (05/07/17 0708)    TPN (2 in 1) Adult Cyclical      And    fat emulsion soybean oil         PRN Meds:  HYDROmorphone PF **OR** HYDROmorphone PF, LORazepam, heparin lock flush, ondansetron

## 2017-05-07 NOTE — Progress Notes (Addendum)
Surgical Oncology/Hepatobiliary Surgery Progress Note     LOS: 39 days     Subjective:  Afebrile overnight  Oriented to self and place, not to year  Denies N/V  Did not ambulate yesterday  On TPN, took in 600cc PO yesterday   Having bowel movements and passing flatus     Objective:    Vitals Sign Ranges for Past 24 Hours:  BP: (120-150)/(70-80)   Temp:  [36 C (96.8 F)-36.3 C (97.3 F)]   Temp src: Temporal (03/05 0556)  Heart Rate:  [76-95]   Resp:  [16]   SpO2:  [95 %-98 %]   Weight:  [65.8 kg (145 lb 1.6 oz)]       Physical Exam:    General Appearance: in NAD, speaking slowly/quietly  Cardiac: regular rate  Respiratory: non-labored breathing on room air  Abdomen: soft, non-tender, distended, pre-existing ecchymosis, biliary tube to clamp, LUQ drain with purulent output. Anterior drain with bloody/purulent output.  Extremities: warm    Labs:    CBC:    Recent Labs  Lab 05/07/17  0201 05/05/17  2329 05/05/17  0617 05/04/17  0515 05/03/17  0012 05/02/17  0042   WBC 11.8* 11.4* 12.1* 13.1* 13.7* 10.9*   Hemoglobin 6.3* 6.7* 6.6* 7.4* 7.0* 7.2*   Hematocrit 20* 21* 21* 24* 22* 23*   Platelets 484* 512* 525* 565* 597* 454*       Metabolic Panel:    Recent Labs  Lab 05/07/17  0201 05/05/17  2329 05/05/17  0617 05/04/17  0515 05/03/17  0012 05/02/17  0042 05/01/17  0017   Sodium 134 131* 131* 131* 128* 129* 128*   Potassium 4.0 3.9 3.7 4.3 3.8 4.4 4.2   Chloride 100 97 98 97 94* 95* 95*   CO2 _0 17* _1 UN 23* 21* 21* 19 23* 22* 24*   Creatinine 0.56 0.59 0.65 0.68 0.69 0.67 0.64   Glucose 165* 122* 130* 137* 144* 112* 127*   Calcium 8.4* 8.3* 8.2* 8.2* 8.4* 8.3* 8.0*   Magnesium 1.8 2.1 1.8  --  1.9 2.0 2.0   Phosphorus 4.0 3.6 3.1  --  3.6 3.1 3.5          Assessment:    72 y.o. female with h/o recent PE and pancreatic cancer POD # 39  status post whipple procedure complicated by delayed gastric emptying.  NGT replaced on 04/04/17. UGI on 04/12/17 concerning for obstruction just beyond the [place] in the  efferent limb.  Course complicated on 0/98/11 by rising LFTs and sepsis with CT concerning for cholangitis given biliary tree obstruction and PV compression due to hematoma and afferent limb distention in setting of PE treatment. Is s/p IR PTC on 04/16/17 and IR percutaneous aspiration of anterior abdominal fluid collection on 04/24/17. IR LUQ perc drain placement 3/1.    Plan:  - Continue regular diet, calorie counts  - Continue TPN  - 1 unit pRBCs for anemia  - Aggressive bowel regimen  - Methylnaltrexone  - Continue ertapenem/caspo/dapto, f/u additional culture results, appreciate ID recs  - Continue capped biliary drain  - SCDs, heparin SC (hold therapeutic anticoagulation) for VTE ppx  - Dispo: pending clinical course, IR drains    Cristie Hem, MD  05/07/2017     6:41 AM   General Surgery Resident    HPB-GI Attending Addendum:   I personally examined the patient, reviewed the notes, and discussed the plan of care with the  residents and patient. Agree with detailed resident's note. Please see the note above for details of history, exam, labs, assessment/plan which reflect my input.     Patient seen and evaluated on 05/07/17 at 515 pm. Note delayed.      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 Abdominal abscess on imaging. Drains placed with purulent drainage. SIRS without sepsis. Improving.      Anorexia. Encouraging oral intake.   Hyperglycemia with insulin sliding scale and recurrent need for insulin. continue   Post operative anemia. No evidence of bleeding. Will begin iron replacement.  Bilateral pleural effusions with lung compression. Left greater than right. Monitor for now.  Pulmonary embolism. Present on admission. No evidence of worsening.  Continue to hold anticoagulation.  Hypoalbuminemia. Malnutrition. TPN. Encourage oral intake. Reglan started. Nutritional supplementation.  Depression.  Encouragement provided. Trial of new anti-depressant therapy.  Counseling. Daily encouragement.  Leukocytosis. VRE. Antibiotics transitioned due to resistant organisms.   Multiple intraabdominal abscesses with Enterobacter cloacae complex bacteremia and fungus.   Drains placed with purulent output.   Repeat scan pending.   - VRE and Enterobacter cloacae bacteremia due to the intra-abdominal source  Constipation . Aggressive regimen.      Delano Metz, MD, FACS  Hepatobiliary, Pancreas & GI Surgery

## 2017-05-07 NOTE — Plan of Care (Signed)
Problem: Pain/Comfort  Goal: Patient's pain or discomfort is manageable  Outcome: Progressing towards goal      Problem: Mobility  Goal: Patient's functional status is maintained or improved  Outcome: Progressing towards goal      Problem: Cognitive function  Goal: Cognitive function will be maintained  Outcome: Maintaining    Goal: Cognitive function will be maintained or return to baseline  Outcome: Maintaining      Problem: Bowel Elimination  Goal: Elimination patterns are normal or improving  Outcome: Progressing towards goal      Problem: Post-Operative Hemodynamic Stability  Goal: Maintain Hemodynamic Stability  Outcome: Progressing towards goal      Problem: Post-Operative Complications  Goal: Patient will remain free from symptoms of infection-post op  Outcome: Maintaining    Goal: Prevent post-operative complications  Outcome: Maintaining      Problem: GI Bleeding Elimination  Goal: Elimination of patterns are normal or improving  Outcome: Maintaining

## 2017-05-07 NOTE — Progress Notes (Signed)
Assumed care 0700-1500.  Pt. VSS. Pt received one unit of blood and tolerated well. Pt has BM x2. Pt was unable to tolerate to stand for BM clean up. Pt had 2 rest while clean up of BM. Pt received pain med Q2hrs with positive effects. Pt currently resting comfortably in recliner chair. Call bell use was reinforced. For more information please refer to pt chart.   Will continue to monitor.  Karmen Stabs, RN

## 2017-05-07 NOTE — Comprehensive Assessment (Signed)
05/07/17 1451   Demographics   Religious Beliefs None   [place]   Marital status Divorced   Ethnicity/Race Caucasian   Primary Language English   Primary Care Taker of? No one   Risk Factors   Risk Factors Adjustment to Dx/Injury/Illness;Age related issues   [place] (Also required for Hospice Patients)   *Has patient (or family) completed any of the following? (select all that apply) Health Care Proxy (HCP)   HCP Name Rod Mae   HCP Phone Number [phone]    HCP available for inclusion in the chart? Yes   HCP in chart Yes   POA available for inclusion in chart? No   *Would they like to discuss any issues related to MOLST, DNR Order, HCP, Living Will, or POA? No   *Health Care Directive teaching done No   Living Situation   Lives With Family  (pt lives with daughter in a townhome )   Can they assist patient after discharge? Yes   Home Geography   Type of Home 2 Story home  (Townhome )   # Of Steps In Home 16   # Steps to Enter Home 2   Bedroom Second floor   Bathroom Second floor - full;First floor - half   Utilitites Working Yes   Psychosocial   Person assessed Patient   Coping Status Well   Current Goal of Care Rehab   Alcohol Assessment   > 0 ETOH drinks in past month N   SW Substance Abuse (not incl alcohol)   Other Substance Abuse No   Baseline ADL functioning   Transfers Independent   Ambulation Independent   Assistive Device Cane   Bathing/Grooming Independent   Meal Prep Independent   Able to feed self? Yes   Household maintenance/chores Independent   Able to drive? 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  No   Home Care Services   Do you currently have home care services? Yes  (Has used [facility]  prior to hospital admission after receieving 8 rounds of chemotherapy )   Home Care Agency Name [facility] delivered [facility]   Current home equipment available [employer] oxygen   Do you have home oxygen? No   HH Complex Discharge   [place] Complex Discharge No   Time Spent   Time Spent with Patient (min) 15     SW into see pt and daughter to discuss discharge planning and for a risk assessment. Pt was resting due to pain at that time thus SW dialogued with pt daughter. Pt daughter highly familiar with the risk screening process and information regarding SNF plans as she works for [employer] [facility]. Pt daughter reports that the two of them live in a two-story townhouse in [place], [place]. Pt daughter reports the town home has two steps to enter the home, with 16 steps getting from the first to the second level. Pt daughter also indicates that pt bedroom is located on the second floor in addition to the bath. Pt daughter includes that there is also a bathroom located on the first floor.    Upon  hospital admission pt was identified as being independent in ambulation, grooming/bathing, cooking, and cleaning. Pt does report that she uses a shower chair and has a cane but doesn't fully rely on it. Pt also has a history of driving but has not physically driven in a few months. Pt daughter indicates that she brings her mother to get medications and to grocery shop. Pt daughter indicates that pt has taken advantage home care services through [facility] for [facility] after receiving eight rounds of chemotherapy. Pt daughter reports of pt no longer working as the conditions of her medical status have made working hard to manage. Pt daughter reports that due to this, pt has retired and receives Fish farm manager retirement. SW also noted of pt having [employer].    SW has previously given pt/daughter a SNF packet and will connect with family later on to further discuss the next steps in  discharge planning. Pt daughter is super supportive and knowledgeable on SNF packet process. SW will continue to plan for safe discharge and continue as needed.

## 2017-05-07 NOTE — Consults (Signed)
Medical Nutrition Therapy Brief Note: TPN check    Patient remains on TPN for nutrition support. Chart, labs, medications / TPN order, I/Os reviewed. Na 134 (WNL), K+ 4.0 (WNL), Cl 100 (WNL), CO2 21 (WNL), Ca 9.84 (WNL corrected for low albumin 2.2), PO4 4.0 (WNL), Mg 1.8 (WNL). Recent t bili 0.2 (WNL) from 3/5. Recent TGs 192 (WNL) from 3/4.  BGs ranging from 122 - 165 over the past 24 hours. MAR reviewed with repletions noted for Mg sulfate. I/Os significant for 120 ml drain output, BM x2, 2150 ml UOP. Current TPN order reviewed - please see recommendations below for changes.    Recommendations:  1.  Recommend day 2 TPN (16 hours): 101 mL/hr x 15 hrs then taper to 45 mL/hr x 1 hr     Amino acids: 69 g/L               Dextrose: 173 g/L               Standard additives except the following:                                       Na: 182mEq/L  K: 37mEq/L  Ca: 38mEq/L  Mg: 92mEq/L  PO4: 27mmol/L  200 mg levocarnitine                           Maximize Chloride                          Insulin: 0 units/LITER              Fat Emulsion 20% infusion: 60 gm/day (to be infused over 12 hrs)              -TPN provides: 1948 kcal/day, 108 g protein/day, 270 g dextrose/day and 1860 mL      fluid/day (including amino acid/dextrose solution and lipid volume).   2.   Continue to monitor fluid balance and provide non-dextrose containing IVF to replace any extraordinary losses   3.    Please check BG 1 hr into TPN infusion, 2 hrs before D/C of TPN and 30 minutes after TPN infusion is complete.  4.    Continue TPN lab monitoring   5.    Continue regular diet and Ensure 1.0 TID  Will continue to monitor.     Laurence Aly, [place]  Pager [phone]

## 2017-05-07 NOTE — Plan of Care (Signed)
Problem: Safety  Goal: Patient will remain free of falls  Outcome: Progressing towards goal      Problem: Pain/Comfort  Goal: Patient's pain or discomfort is manageable  Outcome: Progressing towards goal      Problem: Mobility  Goal: Patient's functional status is maintained or improved  Outcome: Maintaining      Problem: Nutrition  Goal: Patient's nutritional status is maintained or improved  Outcome: Maintaining    Goal: Nutritional status is maintained or improved - Geriatric  Outcome: Maintaining      Problem: Cognitive function  Goal: Cognitive function will be maintained  Outcome: Progressing towards goal      Problem: Bowel Elimination  Goal: Elimination patterns are normal or improving  Outcome: Progressing towards goal      Problem: Psychosocial  Goal: Demonstrates ability to cope with illness  Outcome: Progressing towards goal

## 2017-05-08 ENCOUNTER — Inpatient Hospital Stay: Payer: Medicare (Managed Care)

## 2017-05-08 LAB — COMPREHENSIVE METABOLIC PANEL
ALT: 59 U/L — ABNORMAL HIGH (ref 0–35)
AST: 46 U/L — ABNORMAL HIGH (ref 0–35)
Albumin: 2.4 g/dL — ABNORMAL LOW (ref 3.5–5.2)
Alk Phos: 429 U/L — ABNORMAL HIGH (ref 35–105)
Anion Gap: 14 (ref 7–16)
Bilirubin,Total: 0.3 mg/dL (ref 0.0–1.2)
CO2: 21 mmol/L (ref 20–28)
Calcium: 8.1 mg/dL — ABNORMAL LOW (ref 8.6–10.2)
Chloride: 97 mmol/L (ref 96–108)
Creatinine: 0.56 mg/dL (ref 0.51–0.95)
GFR,Black: 109 *
GFR,Caucasian: 94 *
Glucose: 110 mg/dL — ABNORMAL HIGH (ref 60–99)
Lab: 21 mg/dL — ABNORMAL HIGH (ref 6–20)
Potassium: 3.8 mmol/L (ref 3.3–5.1)
Sodium: 132 mmol/L — ABNORMAL LOW (ref 133–145)
Total Protein: 6.7 g/dL (ref 6.3–7.7)

## 2017-05-08 LAB — CBC AND DIFFERENTIAL
Baso # K/uL: 0.1 10*3/uL (ref 0.0–0.1)
Basophil %: 0.9 %
Eos # K/uL: 0.2 10*3/uL (ref 0.0–0.4)
Eosinophil %: 1.7 %
Hematocrit: 25 % — ABNORMAL LOW (ref 34–45)
Hemoglobin: 7.9 g/dL — ABNORMAL LOW (ref 11.2–15.7)
Lymph # K/uL: 0.6 10*3/uL — ABNORMAL LOW (ref 1.2–3.7)
Lymphocyte %: 5.2 %
MCH: 28 pg/cell (ref 26–32)
MCHC: 32 g/dL (ref 32–36)
MCV: 89 fL (ref 79–95)
Mono # K/uL: 0.4 10*3/uL (ref 0.2–0.9)
Monocyte %: 3.4 %
Neut # K/uL: 10.9 10*3/uL — ABNORMAL HIGH (ref 1.6–6.1)
Nucl RBC # K/uL: 0 10*3/uL (ref 0.0–0.0)
Nucl RBC %: 0.2 /100 WBC (ref 0.0–0.2)
Platelets: 490 10*3/uL — ABNORMAL HIGH (ref 160–370)
RBC: 2.8 MIL/uL — ABNORMAL LOW (ref 3.9–5.2)
RDW: 18 % — ABNORMAL HIGH (ref 11.7–14.4)
Seg Neut %: 81.9 %
WBC: 12.8 10*3/uL — ABNORMAL HIGH (ref 4.0–10.0)

## 2017-05-08 LAB — DIFF MANUAL
Bands %: 3 % (ref 0–10)
Diff Based On: 116 CELLS
Metamyelocyte %: 1 % (ref 0–1)
Myelocyte %: 3 % — ABNORMAL HIGH (ref 0–0)

## 2017-05-08 LAB — RED BLOOD CELLS
Coded Blood type: 600
Component blood type: A NEG
Dispense status: TRANSFUSED

## 2017-05-08 LAB — PHOSPHORUS: Phosphorus: 3.5 mg/dL (ref 2.7–4.5)

## 2017-05-08 LAB — POCT GLUCOSE
Glucose POCT: 140 mg/dL — ABNORMAL HIGH (ref 60–99)
Glucose POCT: 98 mg/dL (ref 60–99)
Glucose POCT: 147 mg/dL — ABNORMAL HIGH (ref 60–99)
Glucose POCT: 126 mg/dL — ABNORMAL HIGH (ref 60–99)

## 2017-05-08 LAB — MAGNESIUM: Magnesium: 1.9 mg/dL (ref 1.6–2.5)

## 2017-05-08 LAB — AEROBIC CULTURE

## 2017-05-08 LAB — CK: CK: 23 U/L — ABNORMAL LOW (ref 26–192)

## 2017-05-08 LAB — GRAM STAIN

## 2017-05-08 LAB — BLOOD CULTURE

## 2017-05-08 MED ORDER — FAT EMULSION SOYBEAN OIL 20 % IV EMUL *WRAPPED*
60.0000 g | Freq: Every day | INTRAVENOUS | Status: AC
Start: 2017-05-08 — End: 2017-05-09
  Administered 2017-05-08: 60 g via INTRAVENOUS
  Filled 2017-05-08: qty 300

## 2017-05-08 MED ORDER — POTASSIUM CHLORIDE 10 MEQ/50ML IV SOLN *I*
10.0000 meq | Freq: Once | INTRAVENOUS | Status: AC
Start: 2017-05-08 — End: 2017-05-08
  Administered 2017-05-08: 10 meq via INTRAVENOUS
  Filled 2017-05-08: qty 50

## 2017-05-08 MED ORDER — SODIUM CHLORIDE 0.9 % IV SOLN WRAPPED *I*
8.0000 mg/kg | INTRAVENOUS | Status: DC
Start: 2017-05-09 — End: 2017-05-10
  Administered 2017-05-09: 500 mg via INTRAVENOUS
  Filled 2017-05-08 (×2): qty 10

## 2017-05-08 MED ORDER — LIDOCAINE HCL 1 % IJ SOLN *I*
INTRAMUSCULAR | Status: AC
Start: 2017-05-08 — End: 2017-05-08
  Filled 2017-05-08: qty 20

## 2017-05-08 MED ORDER — STERILE WATER FOR INJECTION (TPN USE ONLY) *I*
INTRAVENOUS | Status: AC
Start: 2017-05-08 — End: 2017-05-09
  Filled 2017-05-08: qty 717.6

## 2017-05-08 NOTE — Progress Notes (Signed)
Surgical Oncology/Hepatobiliary Surgery Progress Note     LOS: 40 days     Subjective:  TMax 38.1 overnight  Improved mental status  Denies N/V  Did not ambulate yesterday  On TPN, took in 1250cc PO yesterday   Having bowel movements and passing flatus     Objective:    Vitals Sign Ranges for Past 24 Hours:  BP: (110-154)/(58-86)   Temp:  [36 C (96.8 F)-38.1 C (100.6 F)]   Temp src: Oral (03/06 0550)  Heart Rate:  [85-107]   Resp:  [16-24]   SpO2:  [91 %-98 %]       Physical Exam:    General Appearance: in NAD, speaking slowly/quietly  Cardiac: regular rate  Respiratory: non-labored breathing on room air  Abdomen: soft, non-tender, distended, pre-existing ecchymosis, biliary tube to clamp, LUQ drain with purulent output. Anterior drain with bloody/purulent output.  Extremities: warm    Labs:    CBC:    Recent Labs  Lab 05/08/17  0005 05/07/17  1152 05/07/17  0201 05/05/17  2329 05/05/17  0617 05/04/17  0515 05/03/17  0012   WBC 12.8*  --  11.8* 11.4* 12.1* 13.1* 13.7*   Hemoglobin 7.9*  --  6.3* 6.7* 6.6* 7.4* 7.0*   Hematocrit 25* 26* 20* 21* 21* 24* 22*   Platelets 490*  --  484* 512* 525* 565* 007*       Metabolic Panel:    Recent Labs  Lab 05/08/17  0005 05/07/17  0201 05/05/17  2329 05/05/17  0617 05/04/17  0515 05/03/17  0012 05/02/17  0042   Sodium 132* 134 131* 131* 131* 128* 129*   Potassium 3.8 4.0 3.9 3.7 4.3 3.8 4.4   Chloride 97 100 97 98 97 94* 95*   CO2 '21 21 21 21 ' 17* 20 22   UN 21* 23* 21* 21* 19 23* 22*   Creatinine 0.56 0.56 0.59 0.65 0.68 0.69 0.67   Glucose 110* 165* 122* 130* 137* 144* 112*   Calcium 8.1* 8.4* 8.3* 8.2* 8.2* 8.4* 8.3*   Magnesium 1.9 1.8 2.1 1.8  --  1.9 2.0   Phosphorus 3.5 4.0 3.6 3.1  --  3.6 3.1          Assessment:    72 y.o. female with h/o recent PE and pancreatic cancer POD # 40  status post whipple procedure complicated by delayed gastric emptying.  NGT replaced on 04/04/17. UGI on 04/12/17 concerning for obstruction just beyond the [place] in the efferent limb.  Course  complicated on 08/24/61 by rising LFTs and sepsis with CT concerning for cholangitis given biliary tree obstruction and PV compression due to hematoma and afferent limb distention in setting of PE treatment. Is s/p IR PTC on 04/16/17 and IR percutaneous aspiration of anterior abdominal fluid collection on 04/24/17. IR LUQ perc drain placement 3/1.    Plan:  - NPO for IR drain placement, then continue regular diet  - Calorie counts starting at MN tonight  - Cycle TPN  - Aggressive bowel regimen  - Methylnaltrexone  - Continue ertapenem/caspo/dapto, f/u additional culture results, appreciate ID recs  - Continue capped biliary drain  - SCDs, heparin SC (hold therapeutic anticoagulation) for VTE ppx  - Dispo: pending clinical course, IR drains    Diagnoses for this hospitalization include:  Moderate Malnutrition - on TPN, now tolerating regular diet        Sepsis - Overall clinical picture improved  Anemia- improved s/p pRBCs 3/5  Cristie Hem, MD  05/08/2017     6:26 AM   General Surgery Resident

## 2017-05-08 NOTE — Progress Notes (Signed)
Report Given     Handoff received from [place] on unit [facility]. Aware of patient coming to [facility] for CT.    Clent Ridges, RN received handoff report from Amy on [facility] unit for CT scan in [facility] 05/07/17 10:41 AM.        Procedure Summary  [employer] Nursing Procedure Note    Nikeshia Keetch  [id number]    Procedure: perc drain        Status: Completed    Patient tolerated procedure well     Specimen Collection: yes    Sponge count: N/A    Fluid Removed:Yes, color:mucoid, amount: 25 ml  Procedure Dressing Site located:Abdomen  Dressing Type:sterile gauze/tegaderm  Biopatch:no  Dressing status:Clean, dry and intact   Hematoma:Not evident  Medication received:None  Cardiovascular:   Peripheral Pulses: Present post procedure      Fistula: N/A    Neuro Assessment:Patient is at pre-procedure baseline    Implant patient information given to patient or parent/guardian:N/A    Report given to:Unit Nurse. Unit [facility]5 Name Amy RN       Last Filed Vitals    05/08/17 1034   BP: 119/81   Pulse: 91   Resp: (!) 31   Temp:    SpO2: 97%

## 2017-05-08 NOTE — Interval H&P Note (Signed)
UPDATES TO PATIENT'S CONDITION on the DAY OF SURGERY/PROCEDURE    I. Updates to Patient's Condition (to be completed by a provider privileged to complete a H&P, following reassessment of the patient by the provider):    Day of Surgery/Procedure Update:  History  History reviewed and no change    Physical  Physical exam updated and no change            II. Procedure Readiness   I have reviewed the patient's H&P and updated condition. By completing and signing this form, I attest that this patient is ready for surgery/procedure.    III. Attestation   I have reviewed the updated information regarding the patient's condition and it is appropriate to proceed with the planned surgery/procedure.    Lora Paula, MD as of 10:17 AM 05/08/2017

## 2017-05-08 NOTE — Progress Notes (Signed)
Palliative Care Progress Note    HPI: [name] w/ PMH fibromyalgia, pancreatic CA s/p Whipple for 'cure' 03/29/17; s/p ICU w/ intub for resp failure & septic shock, weaned from pressors and extubated to HFNC 2/17 w/ IR drainage bili fluid collection 2/19, ID following & receiving IV abx (caspo ends 3/2). Palliative following for symptoms, started methadone 2/19, weaned dose 2/23 d/t concern for sedation. Recent bowel clean out s/p enema & methylnaltrexone. Unfortunately course complicated by intraabdominal abscesses, s/p IR for placement of perc drains & evacuation of pus, ID following; pt remains on cyclic TPN w/ poor po intake.     Subjective: "It's been incredible, I feel so drained (literally)!"    Objective: chart reviewed, pt laying in bed, says abd pain is tolerable though noted w/ facial grimacing and endorsed intermittent sharp LLQ pain, responsive to gentle massage and heat, encouraged to not hold back from passing gas; pt using dilaudid IV prn freq (total 7 mg in 24 hrs) - discussed rationale for holding on increasing pt's methadone for another day or so to give time for acute abd pain r/t abscess & drain placement settle; discussed trying low dose ritalin in AM to help w/ flat mood and energy w/ hope pt may be more apt to want to eat and get up and walk; pt's dtr [name] at bedside and supportive of pt giving ritalin a try    Palliative Care ROS:  Pain   Moderate  Nausea   None  Anorexia   Severe  Anxiety   Mild  Depression   Moderate  Shortness of Breath   Mild  Tiredness/Fatigue   Moderate  Drowsiness/Sleepiness   Mild  Airway Secretions   no  Constipation   no  Unable to Respond   no  Delirium   no   Last stool 3/5    Physical Examination:   BP: (90-150)/(50-86)   Temp:  [35.3 C (95.5 F)-38.1 C (100.6 F)]   Temp src: Temporal (03/06 1729)  Heart Rate:  [83-107]   Resp:  [16-31]   SpO2:  [91 %-99 %]   Gen appearance: [name], laying in bed, endorses mod constant abd pain and states  tolerable, no energy or appetite  Lungs: easy resp, RA; noted to take shallow resp d/t guarding abd pain - enc to use IS  Heart: reg  Abd: large, distended, R side perc drain x 3 and L Bili tube, stool yesterday  Extrem: mild LE edema  Skin: pale, warm, dry  Neuro: pt w/ flat affect, her baseline is A+O x 3, ambulates short distances w/ walker     Assessment/Plan: [name] w/ pancreatic CA, s/p Whipple in Jan. remains on TPN, now receiving IV abx s/p IR for R side perc drain x 3 and L side bili; continues on every other day methylnaltrexone w/ + results passing stool and gas. Pt has used total 7 mg IV dilaudid prn over last 24 hrs and in agreement w/ waiting to increase methadone, willing to try ritalin in AM to help w/ energy and mood.    Pain/dyspnea  Acetaminophen 1 gm po TID  Methadone 5 mg po TID (dose weaned 2/23, may consider increasing dose if pt still w/ uncontrolled pain, willing to monitor for another day to settle s/p IR w/ drain placement)  Dilaudid 0.5 mg IV Q 1 hr prn (x 3 on 3/5, x 2 on 3/6)    Dilaudid 1 mg IV Q 1 hr prn (x  5 on 3/5, x 5 on 3/6)  Lidocaine patch daily  Encourage incentive spirometry prn     Anxiety/agitation/nausea  Amitriptyline 25 mg po QHS  Duloxetine 20 mg po QHS (started 3/4, switched from lexapro)  Reglan 10 mg po 4x/d (AC & QHS)  Pantoprazole 40 mg po daily  Lorazepam 0.5 mg po Q 6 hrs prn (last x 1 on 2/28)  Ondansetron 4 mg IV Q 6 hrs prn (last x 1 on 2/18)  Please order ritalin 5 mg po daily starting tomorrow am (for mood, energy)    Prevention of constipation, last stool 3/5   Colace 200 mg po BID  Miralax 17 gm po BID  Senna 1 tab po BID  Methylnaltrexone 12 mg SC every other day (started 2/26)  Dulcolax supp x 1 today  Reviewed splinting pelvic floor muscles when attempting to defecate for additional support     GOC/prognosis  Full code  Dtr Rod Mae is HCP    Pt case discussed w/ my PC attending Dr Marcille Blanco this AM, pt's dtr [name] (RN) and covering provider  aware of rec for ritalin. We will continue to follow along, please call if questions/concerns.    Total Time Spent 35 minutes: >50% of time was spent in counseling and/or coordination of care.     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    Hypothyroidism E03.9    Pancreatic cancer C25.9    Anemia D64.9     hx of Pulmonary embolism I26.99    Depression F32.9    Sepsis A41.9       No Known Allergies (drug, envir, food or latex)    Scheduled Meds:    [START ON 05/09/2017] daptomycin IV  8 mg/kg Intravenous Q24H    ascorbic acid  500 mg Oral Daily with breakfast    insulin glargine  12 Units Subcutaneous Nightly    DULoxetine  20 mg Oral Daily    ertapenem  1,000 mg Intravenous Q24H    metoclopramide  10 mg Oral 4x Daily AC & HS    lidocaine  1 patch Transdermal Q24H    methylnaltrexone  12 mg Subcutaneous Every Other Day    senna  1 tablet Oral 2 times per day    ferrous gluconate  324 mg Oral Daily with breakfast    docusate sodium  200 mg Oral 2 times per day    polyethylene glycol  17 g Oral 2 times per day    pantoprazole  40 mg Oral QAM    caspofungin  50 mg Intravenous Q24H    acetaminophen  1,000 mg Oral Q8H    methadone  5 mg Oral Q8H    heparin (porcine)  5,000 Units Subcutaneous Q8H    levothyroxine  137 mcg Oral Daily    amitriptyline  25 mg Oral Nightly       Continuous Infusions:    TPN (2 in 1) Adult Cyclical 401 mL/hr at 02/72/53 1732    And    fat emulsion soybean oil 60 g (05/08/17 1733)    sodium chloride Stopped (05/07/17 0708)       PRN Meds:  HYDROmorphone PF **OR** HYDROmorphone PF, LORazepam, heparin lock flush, ondansetron

## 2017-05-08 NOTE — Progress Notes (Signed)
Assumed care of patient at 2300. Patient A&Ox3. NSR/ST on tele. Patient has new low grade temp of 38.1 w/ new facial flushing and patient is very warm to touch. Mikey Bussing, NP notified. No new orders. Patient RR are also increased from 16-24. All other vitals are stable at this time. Patient denies nausea, vomiting, diarrhea, SOB and chest pain. Patient c/o 8/10 pain. IV dilaudid given w/ + effects. Patients abdominal incision is dry intact with lots abdominal bruising, not new from lovenox per patient. Patient bili tube is clamped per order. Midline perc drain patent with small amount serosanguinous output. Left perc drain is patent with small amount of foul tan/milky output. Foley is patent with adequate amount of urine output. Patient T&P q2. Patient having very poor PO intake. Legs elevated. IS encouraged. Patient currently resting with call bell within reach. Will continue to monitor and assess.Ardell Isaacs, RN

## 2017-05-08 NOTE — Invasive Procedure Plan of Care (Addendum)
Invasive Procedure Plan of Care (Consent Form 419):   Condition(s) Addressed: Abnormal fluid collection which may be infected   Performing Provider: Rella Larve, and Vascular and Interventional Radiology Attendings, Fellows, Residents and Advanced Practice Providers      Side: Left    Procedure: Fluid aspiration and drainage of abdominal fluid collection   Special Equipment:    Planned Anesthesia: Local   Benefits: Symptomatic relief by aspirating or draining fluid. Obtain samples to guide further management   Risks: Bleeding, infection, peritonitis, fistula, injury to surrounding organs and structures, and in very rare circumstances death.   Alternatives: Not to perform the procedure   Expected Length of Stay: 0 day(s)     I, or a designated member of my surgical team, have discussed the planned procedure, including the potential for any transfusion of blood products or receipt of tissue as necessary, expected benefits, the potential complications and risks and possible alternatives and their benefits and risks with the patient or the patient's surrogate. In my opinion, the patent or the patient's surrogate understands the proposed procedure, its risks, benefits, and alternatives.    Electronically signed by Mittie Bodo, MD at 10:01 AM     Patient Consent:  I hereby give my consent and authorize Rece Zechman, Halina Maidens, and Vascular and Interventional Radiology Attendings, Fellows, Residents and Advanced Practice Providers     (The list of possible assistants, all of whom are privileged to provide surgical services at the hospital, is available)  To treat the following: Abnormal fluid collection which may be infected  Procedure includes: Fluid aspiration and drainage of abdominal fluid collection  Laterality: Left  1 The care provider has explained my condition to me, the benefits of having the above treatment procedure, and alternate ways of treating my condition. I understand that no guarantees have  been made to me about the result of the treatment. The alternatives to this procedure include: Not to perform the procedure   2 The care provider has discussed with me the reasonably foreseeable risks of the treatment and that there may be undesirable results. The risks that are specifically related to this procedure include: Bleeding, infection, peritonitis, fistula, injury to surrounding organs and structures, and in very rare circumstances death.   3 I understand that during the treatment a condition may be discovered which was not known before the treatment started. Therefore, I authorize the care provider to perform any additional or different treatment which is thought necessary and available.   4 Any tissue, parts, or substances removed during the procedure may be retained or disposed of in accordance with customary scientific, educational and clinical practice.   5 Vendor information if appropriate:    6 If blood products are needed, I would agree:    7 If tissue products are needed, I would agree:    8 Blood/Tissue use limitations and/or exclusions:       I have carefully read and fully understand this informed consent form, and have had sufficient opportunity to discuss my condition and the above procedure(s) with the care provider and his/her associates, and all of my questions have been answered to my satisfaction. I understand that my surgeon/provider performing the procedure may not be physically present in the operating/procedure room the entire time that I am there. My surgeon/provider has answered my questions regarding this and how it may relate to my surgery/procedure. I agree to the Plan of Care as outlined above.  Patient Signature   (or Parent/Legal Guardian if pt is unable to sign or is a minor)  Date/Time     Electronic Signatures will display at the bottom of the consent form.

## 2017-05-08 NOTE — Consults (Signed)
Medical Nutrition Therapy Brief Note: TPN check    Patient remains on TPN for nutrition support. Chart, labs, medications / TPN order, I/Os reviewed. Na 132 (L), K+ 3.8 (WNL), Cl 97 (WNL), CO2 21 (WNL), Ca 9.38 (WNL corrected for low albumin), PO4 3.5 (WNL), Mg 1.9 (WNL). Recent t bili 0.3 (WNL) from 3/6. BGs ranging from 83 - 138 over the past 24 hours (12 units lantus given).  MAR reviewed with repletions noted for Mg sulfate. I/Os significant for -1150 ml PO yesterday (pt to be NPO today for drain placement), -2125 ml UOP, -185 ml drain output, BM x2. Current TPN order reviewed - please see recommendations below for changes.    Recommendations:  1.  Recommend day 3 cycle TPN (12 hours): 145mL/hr x 11 hrs then taper to 67mL/hr x 1 hr              Amino acids:69g/L  Dextrose: 173g/L   Standard additives except the following:   Na: 116mEq/L  K: 19mEq/L  Ca: 37mEq/L  Mg: 34mEq/L  PO4: 44mmol/L  200 mg levocarnitine   Maximize Chloride  Insulin: 0units/LITER  Fat Emulsion 20% infusion: 60gm/day (to be infused over 12 hrs)  -TPN provides: 1948kcal/day, 108g protein/day, 270g dextrose/day and 1860mL fluid/day (including amino acid/dextrose solution and lipid volume).   2.   Continue to monitor fluid balance and provide non-dextrose containing IVF to replace any extraordinary losses   3.    Please check BG 1 hr into TPN infusion, 2 hrs before D/C of TPN and 30 minutes after TPNinfusion is complete.  4.    Continue TPN lab monitoring   5.    Continue regular diet and Ensure 1.0 TID as appropriate     Will continue to monitor.     Amanda Galloway, [place]  Pager [phone]

## 2017-05-08 NOTE — Invasive Procedure Plan of Care (Deleted)
Invasive Procedure Plan of Care (Consent Form 419):   Condition(s) Addressed: Need for moderate sedation during procedure/minor surgery.   Performing Provider: Rella Larve   Side: Not applicable    Procedure: Establishment of moderate sedation.   Special Equipment: None   Planned Anesthesia: Moderate Sedation   Benefits: State of altered consciousness created by administration of intravenous medications (medications in an IV).  Patients under moderate sedation will feel sleepy/relaxed, but will respond to commands and may have memory of the procedure.  The purpose of moderate sedation is to make you feel comfortable during your procedure, and can make your procedure easier to perform.     Risks: Excessive sedation, which may require special procedures to keep you safe, including placement of a breathing tube.  Nausea, vomiting, problems with heart rate or blood pressure, breathing difficilties, very rarely death. Occasionally, vivid dreams or agitation requiring additional medication.   Alternatives: Not undergoing procedure, undergoing procedure without sedation.   Expected Length of Stay:  day(s)     I, or a designated member of my surgical team, have discussed the planned procedure, including the potential for any transfusion of blood products or receipt of tissue as necessary, expected benefits, the potential complications and risks and possible alternatives and their benefits and risks with the patient or the patient's surrogate. In my opinion, the patent or the patient's surrogate understands the proposed procedure, its risks, benefits, and alternatives.    Electronically signed by Mittie Bodo, MD at 9:58 AM     Patient Consent:  I hereby give my consent and authorize Arik Husmann, Halina Maidens  (The list of possible assistants, all of whom are privileged to provide surgical services at the hospital, is available)  To treat the following: Need for moderate sedation during procedure/minor  surgery.  Procedure includes: Establishment of moderate sedation.  Laterality: Not applicable  1 The care provider has explained my condition to me, the benefits of having the above treatment procedure, and alternate ways of treating my condition. I understand that no guarantees have been made to me about the result of the treatment. The alternatives to this procedure include: Not undergoing procedure, undergoing procedure without sedation.   2 The care provider has discussed with me the reasonably foreseeable risks of the treatment and that there may be undesirable results. The risks that are specifically related to this procedure include: Excessive sedation, which may require special procedures to keep you safe, including placement of a breathing tube.  Nausea, vomiting, problems with heart rate or blood pressure, breathing difficilties, very rarely death. Occasionally, vivid dreams or agitation requiring additional medication.   3 I understand that during the treatment a condition may be discovered which was not known before the treatment started. Therefore, I authorize the care provider to perform any additional or different treatment which is thought necessary and available.   4 Any tissue, parts, or substances removed during the procedure may be retained or disposed of in accordance with customary scientific, educational and clinical practice.   5 Vendor information if appropriate: If a vendor representative is expected to be present during my procedure, it has been explained to me that the vendor representative works for (manufacturer of the device to be used) and that his/her role includes . I consent to the vendor representative's presence and involvement as described. If circumstances change and a decision is made during my procedure that a vendor representative's presence is needed, I will be notified of the above after my procedure is  completed.  Equipment: None   6 If blood products are needed, I would  agree:    7 If tissue products are needed, I would agree:    8 Blood/Tissue use limitations and/or exclusions:       I have carefully read and fully understand this informed consent form, and have had sufficient opportunity to discuss my condition and the above procedure(s) with the care provider and his/her associates, and all of my questions have been answered to my satisfaction. I understand that my surgeon/provider performing the procedure may not be physically present in the operating/procedure room the entire time that I am there. My surgeon/provider has answered my questions regarding this and how it may relate to my surgery/procedure. I agree to the Plan of Care as outlined above.                       Patient Signature   (or Parent/Legal Guardian if pt is unable to sign or is a minor)  Date/Time     Electronic Signatures will display at the bottom of the consent form.

## 2017-05-08 NOTE — Progress Notes (Addendum)
Assumed care 0700-1900 pt when to IR and returned. Pt c/o of abdomen pain. IV dilaudid was given q2hrs w/ positive effects. New drain fro IR is draining milky tinged brown foul drainage. Pt behavior is more optimistic today with smile and words of confidence. Pt currently resting in bed.  Will continue to monitor.   Karmen Stabs, RN

## 2017-05-08 NOTE — Progress Notes (Signed)
Interventional Radiology Pre-Procedure Handoff/Checklist    NPO: [x] YES [] NO [] N/A      Anticoagulants:LMW heparin    Telemetry: [x] YES [] NO [] N/A     Transport mode: Bed  Number of Transporters needed: 2 -hover      IV access:   PCVC Triple Lumen (percutaneous) 04/17/17 Left Internal jugular (Active)   Phlebitis Scale Grade 0 05/07/2017 11:45 PM   Infiltration Scale Grade 0 05/07/2017 11:45 PM   Proximal Lumen Status Blood return noted;Flushed;Normal saline locked 05/07/2017 11:45 PM   Medial Lumen Status Blood return noted;Flushed;Infusing 05/07/2017 11:45 PM   Distal Lumen Status Blood return noted;Flushed;Infusing 05/07/2017 11:45 PM   Dressing Type Transparent 05/07/2017 11:45 PM   Dressing Status Clean, dry and intact 05/07/2017 11:45 PM   (T)Transparent Drsg Change-Q7D Dressing changed 05/05/2017  5:00 PM   Dressing Change Due 05/12/17 05/07/2017 11:45 PM   Bag to hub changed? No 05/07/2017 11:45 PM   Next bag to hub change due 05/09/17 05/07/2017 11:45 PM   Cap(s) Changed? No 05/07/2017 11:45 PM   Next cap change due 05/09/17 05/07/2017 11:45 PM   **Need for continuing central line addressed? Yes - central line still necessary 05/07/2017 11:45 PM   Freq of line accesses and lab draws addressed? Yes 05/07/2017 11:45 PM   CVP Transduced No 05/05/2017  7:48 PM       Respiratory: Room Air    Consentable:yes    Alert and Oriented to person, place and time?: yes    Code Status:full     Does patient wear an insulin pump? [] YES [x] NO [] N/A     Precautions:none    Allergies: No Known Allergies (drug, envir, food or latex)    Rubin Payor, RN Received Handoff Report from Amy for IR procedure 8:27 AM

## 2017-05-08 NOTE — Plan of Care (Signed)
Please increase the daptomycin dose from 6mg /kg to 8mg /kg. Discussed with ID pharmacy.        Rosina Lowenstein, MD  Infectious Diseases fellow, PGY-4  Pager [phone]

## 2017-05-08 NOTE — Progress Notes (Signed)
Occupational Therapy    Order received and chart review completed.  Attempted OT evaluation, however pt extremely lethargic and somnolent, falling asleep during conversation about PLOF and orientation. Pt states she lives "I guess alone" in a house in [place]. Pt oriented to [facility], however further orientation questions deferred 2/2 alertness. Transport arrived shortly after OT arrival, RN reporting pt going to IR for procedure. Will re-attempt as able. Thank you.   Jim Desanctis, OTR/L [phone]  Please contact the OT pager [facility] [phone]) for all questions/concerns and/or update requests.

## 2017-05-08 NOTE — Progress Notes (Signed)
Assumed care of pt 0700 -1900. Pt VSS. Pt headed to IR @ 09:45.   Pt returned from IR @ 11:30. Pt VSS. Pt has 3 perc drain on left of abdomen and one biliary on the right. All were flushed aND RECAPPED per order. Pt c/o of abdominal pain 6/10. Pain medication given w/ positive effects. Q2 turn and position for pt. Pt refused to get out of bed to chair. Pt refused enema. Pt has flat affect with tearful behavior. Pt is currently resting in bed comfortably. Will continue to monitor.  Karmen Stabs, RN

## 2017-05-08 NOTE — Plan of Care (Signed)
Problem: Safety  Goal: Patient will remain free of falls  Outcome: Progressing towards goal      Problem: Pain/Comfort  Goal: Patient's pain or discomfort is manageable  Outcome: Progressing towards goal      Problem: Mobility  Goal: Functional status is maintained or improved - Geriatric  Outcome: Maintaining    Goal: Patient's functional status is maintained or improved  Outcome: Maintaining      Problem: Nutrition  Goal: Patient's nutritional status is maintained or improved  Outcome: Progressing towards goal    Goal: Nutritional status is maintained or improved - Geriatric  Outcome: Progressing towards goal      Problem: Cognitive function  Goal: Cognitive function will be maintained  Outcome: Progressing towards goal    Goal: Cognitive function will be maintained or return to baseline  Outcome: Progressing towards goal      Problem: Bowel Elimination  Goal: Elimination patterns are normal or improving  Outcome: Progressing towards goal      Problem: Post-Operative Bowel Elimination  Goal: Elimination pattern is normal or improving  Outcome: Progressing towards goal      Problem: Psychosocial  Goal: Demonstrates ability to cope with illness  Outcome: Progressing towards goal      Problem: Fluid and Electrolyte Imbalance  Goal: Fluid and Electrolyte imbalance  Outcome: Progressing towards goal

## 2017-05-09 ENCOUNTER — Ambulatory Visit: Payer: Medicare (Managed Care) | Admitting: Rehabilitative and Restorative Service Providers"

## 2017-05-09 LAB — COMPREHENSIVE METABOLIC PANEL
ALT: 42 U/L — ABNORMAL HIGH (ref 0–35)
AST: 31 U/L (ref 0–35)
Albumin: 2.3 g/dL — ABNORMAL LOW (ref 3.5–5.2)
Alk Phos: 362 U/L — ABNORMAL HIGH (ref 35–105)
Anion Gap: 14 (ref 7–16)
Bilirubin,Total: 0.2 mg/dL (ref 0.0–1.2)
CO2: 21 mmol/L (ref 20–28)
Calcium: 8.4 mg/dL — ABNORMAL LOW (ref 8.6–10.2)
Chloride: 99 mmol/L (ref 96–108)
Creatinine: 0.55 mg/dL (ref 0.51–0.95)
GFR,Black: 109 *
GFR,Caucasian: 95 *
Glucose: 108 mg/dL — ABNORMAL HIGH (ref 60–99)
Lab: 22 mg/dL — ABNORMAL HIGH (ref 6–20)
Potassium: 4.2 mmol/L (ref 3.3–5.1)
Sodium: 134 mmol/L (ref 133–145)
Total Protein: 6.6 g/dL (ref 6.3–7.7)

## 2017-05-09 LAB — DIFF MANUAL
Bands %: 4 % (ref 0–10)
Diff Based On: 115 CELLS
Metamyelocyte %: 1 % (ref 0–1)
Myelocyte %: 3 % — ABNORMAL HIGH (ref 0–0)

## 2017-05-09 LAB — CBC AND DIFFERENTIAL
Baso # K/uL: 0.2 10*3/uL — ABNORMAL HIGH (ref 0.0–0.1)
Basophil %: 1.7 %
Eos # K/uL: 0.4 10*3/uL (ref 0.0–0.4)
Eosinophil %: 3.5 %
Hematocrit: 25 % — ABNORMAL LOW (ref 34–45)
Hemoglobin: 7.6 g/dL — ABNORMAL LOW (ref 11.2–15.7)
Lymph # K/uL: 0.8 10*3/uL — ABNORMAL LOW (ref 1.2–3.7)
Lymphocyte %: 7 %
MCH: 28 pg/cell (ref 26–32)
MCHC: 31 g/dL — ABNORMAL LOW (ref 32–36)
MCV: 92 fL (ref 79–95)
Mono # K/uL: 0.6 10*3/uL (ref 0.2–0.9)
Monocyte %: 5.2 %
Neut # K/uL: 9.2 10*3/uL — ABNORMAL HIGH (ref 1.6–6.1)
Nucl RBC # K/uL: 0 10*3/uL (ref 0.0–0.0)
Nucl RBC %: 0 /100 WBC (ref 0.0–0.2)
Platelets: 481 10*3/uL — ABNORMAL HIGH (ref 160–370)
RBC: 2.7 MIL/uL — ABNORMAL LOW (ref 3.9–5.2)
RDW: 18.4 % — ABNORMAL HIGH (ref 11.7–14.4)
Seg Neut %: 74.8 %
WBC: 11.7 10*3/uL — ABNORMAL HIGH (ref 4.0–10.0)

## 2017-05-09 LAB — POCT GLUCOSE
Glucose POCT: 159 mg/dL — ABNORMAL HIGH (ref 60–99)
Glucose POCT: 97 mg/dL (ref 60–99)
Glucose POCT: 138 mg/dL — ABNORMAL HIGH (ref 60–99)
Glucose POCT: 117 mg/dL — ABNORMAL HIGH (ref 60–99)

## 2017-05-09 LAB — PHOSPHORUS: Phosphorus: 3.8 mg/dL (ref 2.7–4.5)

## 2017-05-09 LAB — ANAEROBIC CULTURE: Anaerobic Culture: 0

## 2017-05-09 LAB — MAGNESIUM: Magnesium: 1.7 mg/dL (ref 1.6–2.5)

## 2017-05-09 MED ORDER — STERILE WATER FOR INJECTION (TPN USE ONLY) *I*
INTRAVENOUS | Status: AC
Start: 2017-05-09 — End: 2017-05-10
  Filled 2017-05-09: qty 717.6

## 2017-05-09 MED ORDER — ENOXAPARIN SODIUM 40 MG/0.4ML IJ SOSY *I*
40.0000 mg | PREFILLED_SYRINGE | Freq: Every day | INTRAMUSCULAR | Status: DC
Start: 2017-05-09 — End: 2017-06-07
  Administered 2017-05-09 – 2017-06-06 (×29): 40 mg via SUBCUTANEOUS
  Filled 2017-05-09 (×30): qty 0.4

## 2017-05-09 MED ORDER — MAGNESIUM SULFATE 2 GM IN 50 ML *WRAPPED*
2000.0000 mg | Freq: Once | INTRAVENOUS | Status: AC
Start: 2017-05-09 — End: 2017-05-09
  Administered 2017-05-09: 2000 mg via INTRAVENOUS
  Filled 2017-05-09: qty 50

## 2017-05-09 MED ORDER — FAT EMULSION SOYBEAN OIL 20 % IV EMUL *WRAPPED*
60.0000 g | Freq: Every day | INTRAVENOUS | Status: AC
Start: 2017-05-09 — End: 2017-05-10
  Administered 2017-05-09: 60 g via INTRAVENOUS
  Filled 2017-05-09: qty 300

## 2017-05-09 MED ORDER — METHYLPHENIDATE HCL 5 MG PO TABS *I*
5.0000 mg | ORAL_TABLET | Freq: Every day | ORAL | Status: DC
Start: 2017-05-09 — End: 2017-05-09
  Administered 2017-05-09: 5 mg via ORAL
  Filled 2017-05-09: qty 1

## 2017-05-09 MED ORDER — METHYLPHENIDATE HCL 5 MG PO TABS *I*
5.0000 mg | ORAL_TABLET | Freq: Two times a day (BID) | ORAL | Status: DC
Start: 2017-05-10 — End: 2017-05-16
  Administered 2017-05-10 – 2017-05-16 (×14): 5 mg via ORAL
  Filled 2017-05-09 (×15): qty 1

## 2017-05-09 NOTE — Progress Notes (Signed)
Assumed care of pt 0700-1500. Pt q2 vitals stable, tele in NSR, UOP WDL via foley, drains flushed per orders and putting out minimal output milky/tan output. Medicated with 1mg  Dilaudid x 3 throughout the shift. Pt refused some repositioning throughout shift, refused to get out of bed, refused ordered enema. Pt eating very minimally, calorie counts updated in flowsheets per orders.     Will pass along to next shift.

## 2017-05-09 NOTE — Plan of Care (Signed)
Problem: Impaired ADL  Goal: Increase ADL Independence  GROOMING - Patient will groom themselves setup with no device - Time Frame: 10-14 days  UPPER BODY DRESSING - Patient will complete upper body dressing with no device and moderate assist at edge of bed - Time Frame: 10-14 days  LOWER BODY DRESSING - Patient will complete lower body dressing with no device and moderate assist at edge of bed - Time Frame: 10-14 days  TOILETING - Patient will complete toileting with no device and maximal assist on commode - Time Frame: 10-14 days  ADL MOBILITY - Patient will stand pivot transfer to toilet with moderate assist, Adaptive Device: rolling walker - Time Frame: 10-14 days

## 2017-05-09 NOTE — Consults (Signed)
Medical Nutrition Therapy Brief Note: TPN check    Patient remains on TPN for nutrition support. Chart, labs, medications / TPN order, I/Os reviewed. Now has 3 perc drains placed yesterday. Na 134 (WNL), K+ 4.2 (WNL), Cl 99 (WNL), CO2 21 (WNL), Ca 9.76 (WNL corrected for low albumin), PO4 3.8 (WNL), Mg 1.7 (WNL). Recent t bili 0.2 (WNL) from 3/7. BGs ranging from 97 - 159 during the most recent TPN cycle.  MAR reviewed with repletions noted for Mg sulfate, KCl. I/Os significant for 520 ml PO yesterday, -2175 ml UOP, -145 ml drain output. Current TPN order reviewed - no changes to TPN order needed at this time.    Recommendations:  1. Continue current TPN (12 hours): 160mL/hr x 11 hrs then taper to 10mL/hr x 1 hr  Amino acids:69g/L  Dextrose: 173g/L   Standard additives except the following:   Na: 122mEq/L  K: 65mEq/L  Ca: 25mEq/L  Mg: 26mEq/L  PO4: 61mmol/L  200 mg levocarnitine   Maximize Chloride  Insulin: 0units/LITER  Fat Emulsion 20% infusion: 60gm/day (to be infused over 12 hrs)  -TPN provides: 1948kcal/day, 108g protein/day, 270g dextrose/day and 1842mL fluid/day (including amino acid/dextrose solution and lipid volume).   2. Continue to monitor fluid balance and provide non-dextrose containing IVF to replace any extraordinary losses   3. Please check BG 1 hr into TPN infusion, 2 hrs before D/C of TPN and 30 minutes after TPNinfusion is complete.  4. Continue TPN lab monitoring   5. Continue regular diet and Ensure 1.0 TID as appropriate     Will continue to monitor.     Laurence Aly, [place]  Pager [phone]

## 2017-05-09 NOTE — Progress Notes (Signed)
Medicated throughout the night with IV dilaudid for 8-10/10 abdominal pain. Pt sleeping upon pain reassessments. Abdomen remains soft. Drains flushed per order. Pt resting in bed, will continue to monitor.

## 2017-05-09 NOTE — Plan of Care (Signed)
Mobility     Functional status is maintained or improved - Geriatric Maintaining     Patient's functional status is maintained or improved Maintaining        Pain/Comfort     Patient's pain or discomfort is manageable Maintaining        Post-Operative Complications     Patient will remain free from symptoms of infection-post op Maintaining     Prevent post-operative complications Maintaining        Psychosocial     Demonstrates ability to cope with illness Maintaining        Safety     Patient will remain free of falls Maintaining          Bowel Elimination     Elimination patterns are normal or improving Progressing towards goal        Cognitive function     Cognitive function will be maintained Progressing towards goal     Cognitive function will be maintained or return to baseline Progressing towards goal        Fluid and Electrolyte Imbalance     Fluid and Electrolyte imbalance Progressing towards goal        GI Bleeding Elimination     Elimination of patterns are normal or improving Progressing towards goal        Nutrition     Patient's nutritional status is maintained or improved Progressing towards goal     Nutritional status is maintained or improved - Geriatric Progressing towards goal        Post-Operative Bowel Elimination     Elimination pattern is normal or improving Progressing towards goal        Post-Operative Hemodynamic Stability     Maintain Hemodynamic Stability Progressing towards goal

## 2017-05-09 NOTE — Progress Notes (Signed)
Palliative Care Progress Note    HPI: [name] w/ PMH fibromyalgia, pancreatic CA s/p Whipple for 'cure' 03/29/17; s/p ICU w/ intub for resp failure & septic shock, weaned from pressors and extubated to HFNC 2/17 w/ IR drainage bili fluid collection 2/19, ID following & receiving IV abx (caspo ends 3/2). Palliative following for symptoms, started methadone 2/19, weaned dose 2/23 d/t concern for sedation. Recent bowel clean out s/p enema & methylnaltrexone. Unfortunately course complicated by intraabdominal abscesses, s/p IR for placement of perc drains & evacuation of pus, ID following; pt remains on cyclic TPN w/ poor po intake. Trial ritalin 3/7 w/ fair effect, willing to try BID before stopping.    Subjective: "I didn't notice much of an effect in my mood or energy this morning. It's hard for me to get up and move, the pain doesn't help"    Objective: chart reviewed, pt took first dose ritalin this AM, didn't notice much of an effect nor did her dtr & no obvious increase in anxiety from it either - pt willing to increase to 2x/d tomorrow before stopping; discussed increased dilaudid prn use, pt/dtr feel abd visceral pain could be better controlled - worry about sedating effect of increasing methadone though acknowledge freq use of IV dilaudid prn is also sedating and this does not target nerve pain the same way methadone does - pt willing to increase her QHS dose of methadone from 5 to 7.5 mg; pt was interactive though w/ flat effect, talked about her love of music (trained musician plays piano, 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); pt receptive to suggestion of palliative massage, aware she can play music on [employer] music & the instrumental music TV channels w/ pretty pictures on the hospital TV    Palliative Care ROS:  Pain   Moderate  Nausea   None  Anorexia   Severe  Anxiety   Mild  Depression   Moderate  Shortness of Breath   Mild  Tiredness/Fatigue    Mild  Drowsiness/Sleepiness   Mild  Airway Secretions   no  Constipation   no  Unable to Respond   no  Delirium   no   Last stool 3/5    Physical Examination:   BP: (104-138)/(62-80)   Temp:  [35.6 C (96.1 F)-36.6 C (97.9 F)]   Temp src: Temporal (03/07 1400)  Heart Rate:  [87-100]   Resp:  [16-22]   SpO2:  [93 %-98 %]   Gen appearance: [name], laying in bed, endorses mod constant abd pain and states little effect w/ ritalin this AM in regards to energy or appetite  Lungs: easy resp, RA;  enc to use IS  Heart: reg  Abd: large, distended, R side perc drain x 3 and L Bili tube, no stool x 2 days  Extrem: mild LE edema  Skin: pale, warm, dry  Neuro: pt w/ flat affect, her baseline is A+O x 3, ambulates short distances w/ walker & worked w/ OT today though pt felt she didn't have strength or energy to get OOB to chair    Assessment/Plan: [name] w/ pancreatic CA, s/p Whipple in Jan. remains on TPN, now receiving IV abx s/p IR for R side perc drain x 3 and L side bili; continues on every other day methylnaltrexone w/ + results passing stool and gas. Pt has used total 11 mg IV dilaudid prn over last  24 hrs and in agreement w/ increasing methadone dose conservatively QHS. Tried ritalin this AM w/ little to no effect, willing to increase to 2x/d tomorrow to help w/ energy and mood.    Pain/dyspnea  Acetaminophen 1 gm po TID  Methadone 5 mg po TID (dose weaned 2/23, in consideration of pt's continued and escalating dilaudid prn use, recommend  increasing QHS dose to 7.5 mg tonight)  Dilaudid 0.5 mg IV Q 1 hr prn (x 2 on 3/6)    Dilaudid 1 mg IV Q 1 hr prn (x 8 on 3/6, x 6 on 3/7)  Lidocaine patch daily  Encourage incentive spirometry prn     Anxiety/agitation/nausea/fatigue  Amitriptyline 25 mg po QHS  Duloxetine 20 mg po QHS (started 3/4, switched from lexapro)  Reglan 10 mg po 4x/d (AC & QHS)  Pantoprazole 40 mg po daily  Lorazepam 0.5 mg po Q 6 hrs prn (last x 1 on 2/28)  Ondansetron 4 mg IV Q 6 hrs  prn (last x 1 on 2/18)  Ritalin 5 mg po daily (started 3/7 am for mood, energy) increase to 2x/d tomorrow: 5 mg at 8 am and 12 pm    Prevention of constipation, last stool 3/5   Colace 200 mg po BID  Miralax 17 gm po BID  Senna 1 tab po BID  Methylnaltrexone 12 mg SC every other day (started 2/26)     GOC/prognosis  Full code  Dtr Rod Mae is HCP    Pt case discussed w/ RN, PCT, OT and paged covering provider to make aware of rec's. We will continue to follow along, please call if questions/concerns.    Total Time Spent 50 minutes: >50% of time was spent in counseling and/or coordination of care.     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    Hypothyroidism E03.9    Pancreatic cancer C25.9    Anemia D64.9     hx of Pulmonary embolism I26.99    Depression F32.9    Sepsis A41.9       No Known Allergies (drug, envir, food or latex)    Scheduled Meds:    methylphenidate  5 mg Oral Daily    daptomycin IV  8 mg/kg Intravenous Q24H    ascorbic acid  500 mg Oral Daily with breakfast    insulin glargine  12 Units Subcutaneous Nightly    DULoxetine  20 mg Oral Daily    ertapenem  1,000 mg Intravenous Q24H    metoclopramide  10 mg Oral 4x Daily AC & HS    lidocaine  1 patch Transdermal Q24H    methylnaltrexone  12 mg Subcutaneous Every Other Day    senna  1 tablet Oral 2 times per day    ferrous gluconate  324 mg Oral Daily with breakfast    docusate sodium  200 mg Oral 2 times per day    polyethylene glycol  17 g Oral 2 times per day    pantoprazole  40 mg Oral QAM    caspofungin  50 mg Intravenous Q24H    acetaminophen  1,000 mg Oral Q8H    methadone  5 mg Oral Q8H    heparin (porcine)  5,000 Units Subcutaneous Q8H  levothyroxine  137 mcg Oral Daily    amitriptyline  25 mg Oral Nightly       Continuous Infusions:    TPN (2 in 1) Adult Cyclical      And    fat emulsion soybean oil      sodium chloride Stopped (05/07/17 0708)       PRN Meds:  HYDROmorphone PF **OR** HYDROmorphone PF, LORazepam, heparin lock flush, ondansetron

## 2017-05-09 NOTE — Progress Notes (Addendum)
Surgical Oncology/Hepatobiliary Surgery Progress Note     LOS: 41 days     Subjective:  NAE, AVSS, afebrile, on RA  IR drain placed in anterior abd in fluid collection abutting left hepatic lobe  Mental status stable  Denies N/V, pain still an issue  On TPN, took in 430cc PO yesterday   +flatus, -BM yesterday     Objective:    Vitals Sign Ranges for Past 24 Hours:  BP: (90-138)/(50-81)   Temp:  [35.3 C (95.5 F)-36.6 C (97.9 F)]   Temp src: Temporal (03/07 0600)  Heart Rate:  [83-100]   Resp:  [16-31]   SpO2:  [93 %-99 %]       Physical Exam:    General Appearance: in NAD, speaking slowly/quietly  Cardiac: regular rate  Respiratory: non-labored breathing on room air  Abdomen: soft, non-tender, distended, pre-existing ecchymosis, biliary tube clamped, LUQ drain with purulent output. Anterior drain with bloody/purulent output. New anterior (superior) midline drain with purulent output.  Extremities: warm    Labs:    CBC:    Recent Labs  Lab 05/09/17  0100 05/08/17  0005 05/07/17  1152 05/07/17  0201 05/05/17  2329 05/05/17  0617 05/04/17  0515   WBC 11.7* 12.8*  --  11.8* 11.4* 12.1* 13.1*   Hemoglobin 7.6* 7.9*  --  6.3* 6.7* 6.6* 7.4*   Hematocrit 25* 25* 26* 20* 21* 21* 24*   Platelets 481* 490*  --  484* 512* 525* 272*       Metabolic Panel:    Recent Labs  Lab 05/09/17  0100 05/08/17  0005 05/07/17  0201 05/05/17  2329 05/05/17  0617 05/04/17  0515 05/03/17  0012   Sodium 134 132* 134 131* 131* 131* 128*   Potassium 4.2 3.8 4.0 3.9 3.7 4.3 3.8   Chloride 99 97 100 97 98 97 94*   CO2 '21 21 21 21 21 ' 17* 20   UN 22* 21* 23* 21* 21* 19 23*   Creatinine 0.55 0.56 0.56 0.59 0.65 0.68 0.69   Glucose 108* 110* 165* 122* 130* 137* 144*   Calcium 8.4* 8.1* 8.4* 8.3* 8.2* 8.2* 8.4*   Magnesium 1.7 1.9 1.8 2.1 1.8  --  1.9   Phosphorus 3.8 3.5 4.0 3.6 3.1  --  3.6          Assessment:    72 y.o. female with h/o recent PE and pancreatic cancer POD # 41  status post whipple procedure complicated by delayed gastric emptying.   NGT replaced on 04/04/17. UGI on 04/12/17 concerning for obstruction just beyond the [place] in the efferent limb.  Course complicated on 5/36/64 by rising LFTs and sepsis with CT concerning for cholangitis given biliary tree obstruction and PV compression due to hematoma and afferent limb distention in setting of PE treatment. Is s/p IR PTC on 04/16/17 and IR percutaneous aspiration of anterior abdominal fluid collection on 04/24/17. IR LUQ perc drain placement 3/1. IR anterior abdominal 4f perc drain placement 3/3. IR anterior perc drain into left hepatic collection 3/6.    Plan:  - Regular diet + ensures  - Calorie counts re-started today  - Cycle TPN  - Aggressive bowel regimen including methylnaltrexone  - Continue ertapenem/caspo/dapto, f/u additional culture results, appreciate ID recs  - Continue capped biliary drain  - SCDs, heparin SC (hold therapeutic anticoagulation) for VTE ppx  - Dispo: pending clinical course, IR drains    Diagnoses for this hospitalization include:  Moderate Malnutrition -  on TPN, now tolerating regular diet        Sepsis - Overall clinical picture improved  Anemia- improved s/p pRBCs 3/5      Donata Duff, MD  05/09/2017     6:34 AM   General Surgery Resident    HPB-GI Attending Addendum:   I personally examined the patient, reviewed the notes, and discussed the plan of care with the residents and patient. Agree with detailed resident's note. Please see the note above for details of history, exam, labs, assessment/plan which reflect my input.     Patient seen and evaluated on 05/07/17 at 515 pm. Note delayed.      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 Abdominal abscess on imaging. Drains placed with purulent drainage. SIRS without sepsis. Improving.      Anorexia. Encouraging oral intake.   Hyperglycemia with insulin sliding scale and recurrent need for insulin. continue    Post operative anemia. No evidence of bleeding.   Bilateral pleural effusions with lung compression. Left greater than right. Monitor for now.  Pulmonary embolism. Present on admission. No evidence of worsening.  Continue to hold anticoagulation.  Hypoalbuminemia. Moderate malnutrition. TPN. Encourage oral intake. Reglan. Nutritional supplementation.  Depression. Encouragement provided. Trial of new anti-depressant therapy.  Counseling. Daily encouragement.  Leukocytosis. VRE. Antibiotics transitioned due to resistant organisms.   Multiple intraabdominal abscesses with Enterobacter cloacae complex bacteremia and fungus.   Drains placed with purulent output. Drain daily  - VRE and Enterobacter cloacae bacteremia due to the intra-abdominal source  Constipation . Aggressive regimen.      Delano Metz, MD, FACS  Hepatobiliary, Pancreas & GI Surgery

## 2017-05-09 NOTE — Progress Notes (Addendum)
ID Progress Note    Interval:  None acute  Afebrile, HDS  Another drain placed by IR yesterday    Subjective:    Patient reports to be feeling better today in terms of the abdominal pain, denied f/c    Objective:    Temp:  [35.8 C (96.4 F)-36.6 C (97.9 F)] 35.8 C (96.4 F)  Heart Rate:  [87-100] 89  Resp:  [16-22] 16  BP: (100-136)/(54-80) 100/54    PHYSICAL EXAM    General: Sitting in chair, looked more comfortable than prior days and also smiled today, cooperative  Lines: Left IJ CDI, Right chest Mediport de-accessed, PIVs  HEENT: Atraumatic head, EOMI  Cardiovascular: Regular rate and rhythm, no murmurs  Lungs: Breathing well on room air  Abdomen: Soft, tender, 3 left-sided drains in left abdominal area with  purulent drainage, and prior hepatic duct drain on right side  Neurologic: Tired  Extremities: No cyanosis or edema bilaterally    Labs:      Recent Labs  Lab 05/09/17  0100 05/08/17  0005 05/07/17  1152 05/07/17  0201   WBC 11.7* 12.8*  --  11.8*   Hemoglobin 7.6* 7.9*  --  6.3*   Hematocrit 25* 25* 26* 20*   Platelets 481* 490*  --  484*   Seg Neut % 74.8 81.9  --  69.5   Bands % 4 3  --  5       Recent Labs  Lab 05/09/17  0100 05/08/17  0005 05/07/17  0201   Sodium 134 132* 134   Potassium 4.2 3.8 4.0   Chloride 99 97 100   CO2 '21 21 21   ' UN 22* 21* 23*   Creatinine 0.55 0.56 0.56   Glucose 108* 110* 165*   Calcium 8.4* 8.1* 8.4*       Recent Labs  Lab 05/09/17  0100 05/08/17  0005 05/07/17  0201   Albumin 2.3* 2.4* 2.2*   Total Protein 6.6 6.7 6.4   ALT 42* 59* 66*   AST 31 46* 46*   Alk Phos 362* 429* 425*     No results for input(s): INR in the last 72 hours.    No results for input(s): ESR, CRP in the last 168 hours.     Recent Labs  Lab 05/04/17  0515   Lactate 1.6        Imaging:  Ct Abdomen And Pelvis With Contrast    Result Date: 05/07/2017  1. Interval placement of drain within the large left subdiaphragmatic fluid collection; has been significant decrease in size of the fluid collection,  however are several residual fluid collections/ abscesses along/within the left hepatic lobe which may communicate. The communicating collection between the dominant left upper quadrant collection/abscess and the left hepatic lobe dominant abscess is now decompressed and less evident. 2. Interval placement of surgical drain within the anterior peritoneal fluid collection/abscess which abuts the ventral body wall, now markedly decreased in size compared to the prior CT abdomen/pelvis. 3. Multiple focal liver findings as described above including internal/external biliary drain with posttraumatic findings adjacent to the drain. 4. Patient status post Whipple surgery. Interval decrease in the size of the hematoma within the surgical bed, superimposed infection of this fluid cannot be excluded. 5. Interval increase in size of the pelvic fluid collection with multiple foci of air, which may represent hematoma versus infection. 6. Large left pleural effusion with compressive atelectasis. Underlying infection cannot be excluded. END OF IMPRESSION I have  personally reviewed the images and the Resident's/Fellow's interpretation and agree with or edited the findings. [facility] submits this DICOM format image data and final report to the [facility], an independent secure electronic health information exchange, on a reciprocally searchable basis (with patient authorization) for a minimum of 12 months after exam date.          Microbiology:  1/25: Intraoperative bile : NTD    2/12: 1 set of blood cultures from the right Mediport were sent (no peripheral were sent): positive for Enterobacter cloacae complex in 70 hrs, sensitive to zosyn.     2/18 abdominal GS had >25 PMNs, many GPC in pairs/chains, many GNB and cultures 4+ Enterobacter cloacae complex. The cultures also grew 1+ Candida glabrata     2/21: BCx from the Right peripheral IV, mediport, L IJ are NTD    05/03/17: IR-guided I&D with drain placement of the left upper  quadrant collection: GS > 25 PMNs, many GNB and GN diplococci, Cultures with 4+ Enterobacter cloacae complex (resistant to Zosyn), C. Albicans and C. Glabrata, and Candida tropicalis    05/04/17:    1 set of blood cultures from left IJ: VRE at 15.3 TTP,   1 set from periphery: VRE and Enterobacter cloacae at 14.8 TTP     05/05/17:  IR-guided I&D with drain placement of the intrahepatic collection: GS 1-10 PMNs, very few GPC in pairs. Fungal cultures with Candida glabrata, other Cultures pending.     05/05/17: Blood cultures from periphery, mediport and IJ: NTD    05/04/17: Urine cultures obtained (for unclear reason): NTD    05/08/17: Abscess cultures growing C. glabrata    Assessment:    - Multiple intraabdominal abscesses with Enterobacter cloacae complex bacteremia and fungi, now with drain placement in hopes of source control.   - VRE and Enterobacter cloacae bacteremia due to the intra-abdominal source  - Patient at high risk for Fungemia being on TPN  - Left sided pleural effusion  - Pancreatic cancer s/p recent Whipple's on 03/29/17  - Recent cholangitis due to hematoma, with right hepatic duct drain placement      RECOMMENDATIONS:  - Continue ertapenem, daptomycin, caspofungin.   - We have asked micro lab to get susceptibilities on the daptomycin      ID will follow     Patient seen and examined with ID attending, Dr Delphia Grates        Page with Qs.      Rosina Lowenstein, MD  Infectious Diseases fellow, PGY-4  Pager [phone]    ID Attending  I saw and evaluated the patient. I agree with the fellow's findings and plan of care as documented above.    Arnetha Gula, MD

## 2017-05-09 NOTE — Progress Notes (Signed)
Assumed care of pt 1500. VSS, A&Ox3. Pt worked with OT, did not get OOB to chair, refused, but did sit at edge of bed. Pt perc drains x3 putting out small amounts milky drainage. Foley putting out adequate clear yellow urine. Pt taking in very little PO.  Pt reports being fatigued with just taking meds. C/o abdominal pain, prn dilaudid given x2. NRS on tele.  No acute events. Will continue to monitor.     Waverly Ferrari, RN

## 2017-05-09 NOTE — Progress Notes (Signed)
Occupational Therapy Evaluation     05/09/17 1500   OT Tracking   [place]   OT Last Visit   Visit (#) of Five (1)   Precautions   Precautions used Yes   Fall Precautions General falls precautions;Bed alarm engaged   LDA Observation IV lines;Drain;Monitors;Foley cath  (PICC)   Home Living (Prior to Admission)   Prior Living Situation Reported by family   Type of Home (townhouse)   Bedroom Second floor   Bathroom Second floor  (1/2 bath on 1st level)   # Steps to [place] (2-4 steps)   # Of Steps In Home (15-16 steps with railing)   Bathroom Shower/Tub Tub/Shower unit   Prior Function   Prior Function Reported by family;Reported by patient  (daughter)   Level of Independence Independent with ADLs and functional transfers;Independent ambulation;Independent with homemaking with ambulation   Lives With Child  (daughter)   [place] Help From Independent   IADL Independent   Additional Comments Daughter reports pt was independent w/ adls/iadls at baseline without use of AD.  Prepared small meals and laundry tasks.  Daughter assisted pt to md appts.    Pain Assessment   *Is the patient currently in pain? Yes   Pain Location 1 Abdomen;Flank   Orientation of Pain 1 Left   Pain Scale 1 8   Additional Comments Refer to nursing for pain management.    Vision    Current Vision Wears corrective lenses   Additional Comments bifocals   Cognition   Cognition Deficit noted   Level of Alertness Lethargic   Orientation Oriented to person;Oriented to time  (vcs needed for place)   Attention  Impaired focal;Impaired divided/alternating   Memory Decreased working memory;Decreased recall of recent events   Following Commands One step simple   Awareness Impaired   Insight Impaired   Initiation Verbal cues to initiate tasks;Physical cues to initiate tasks   Additional Comments Pt with eyes open upon arrival however appeared somewhat lethargic.  Alert to self and time however when asked about current location, pt provided her  home address and that she lives in a townhome.  Able to provide [place] as correct place when provided a vc on the type of place.  Often asks what she needs to do during session although she had been informed of the next task or step for preparation of task.   Needed re-education on the purpose of drains and appears to lack some awareness of her current situation. Reports she feels very weak, has not received much sleep and feels that she is not able to participate in therapy as needed.  Therapist provided both vc/tactile cues to initiate each task.     Medication Management   Additional Comments Daughter reports setting up medicet for patient once/week. Set alarms for patient as reminder to take meds.    Perception   Perception No deficit noted   Sensation   Sensation No apparent deficit   UE Assessment   UE Assessment Full AROM RUE;Full AROM LUE   Additional Comments BUE strength 3+/5 grossly   Bed Mobility   Supine to Sit Moderate Assist;HOB elevated   Sit to Supine Moderate Assist;HOB elevated   Additional Comments Assistance needed for trunk along with vc/tactile cues for hand/LE placement.    Functional Transfers   Additional Comments NT; pt adamantly refused to transfer into chair this date despite education of its benefits.    Balance   Sitting - Static (SBA)  Sitting - Dynamic Contact Guard  (during LB dressing)   ADL Assessment   Grooming Minimal Assist  (clinical estimate)   UE Dressing Maximum Assist   Where UE Dressing Assessed Edge of bed   Assist Needed With: Shirt around back;Threading arm in sleeve;Set up;Verbal cues   LE Dressing Maximum Assist   Where  LE Dressing Assessed Edge of bed   Assist Needed With: Setup;Pants over feet;Decreased balance;Pants up to waist;Increased time;Socks;Shoes   Bathing Maximal Assist  (clinical estimate (max A for both UB/LB))   Toileting Dependent  (clinical estimate)   Additional Comments Encouraged pt to participate in dressing task EOB.  Crossed R LE over the L  in attempt to don sock however unable to complete without max A and CGA for dynamic sitting balance.  Assistance needed to don UE into sleeve and around back.  Clinical estimate provided for all other self care tasks based on dressing and functional mobility at this time.   Activity Tolerance   Endurance Tolerates 30 min activity with multiple rests   Additional Comments Tolerated tx fairly well multiple rest breaks.  Pt with c/o fatigue and weakness.    Assessment   Assessment Impaired ADL status;Impaired UE strength;Impaired balance;Impaired cognition;Impaired self-care transfers;Impaired instrumental ADL's   Plan   OT Frequency 2-3x/wk   Patient Will Benefit From ADL retraining;Functional transfer training;UE strengthening/ROM;Endurance training;Cognitive re-education;Patient/Family training;Compensatory technique education;IADL training;Exercises   Multidisciplinary Communication   Multidisciplinary Communication RN, PCT, pt, dtr   Recommendation   OT Discharge Recommendations [place]   OT Discharge Equipment Recommended (TBD at rehab)   Additional Comments Currently, pt requires extensive assist with basic adl tasks and functional transfers and is functioning below her baseline of independence.  Also presents with fatigue and generalized weakness which appears to limit pt's participation at this time.  Recommend SNF rehab prior to returning home to maximize level of independence.    Hospital Stay Recommendations Encourage participation in adls and to sit up in chair to improve overall strength.    Timed Calculations:  Timed Codes:  0  Untimed Codes:40 min eval   Unbilled Time: 0  Total Time:  40 mins    Monice Lundy V. Landry Mellow, OTR/L  Please contact the OT pager [phone]) for any questions/concerns and/or update requests.     OT Evaluation Complexity    1.  Chart Review          A expanded review of Patient's Occupational Profile & Medical History was performed.     2.  Occupational Performance           Assessment of patient occupations reveals deficits including:      a.  ADL - Feeding, Grooming, UB dressing, LB dressing, Bathing and Toileting      b. IADL - Medication management, Financial management, Household management and Meal preparation      c. Rest/Sleep - Yes      d. Education - N/A      e. Work - N/A      f. Play - N/A      g. Leisure - Yes      h. Social Participation - Yes    3.  Decision Making          Analysis of data from Detailed Evaluation        A. Some modification needed to complete evaluation.      B. Comorbidities will affect recovery.      C. Several treatment options exist.  4.  Evaluation Complexity is moderate based on the above.

## 2017-05-10 ENCOUNTER — Inpatient Hospital Stay: Payer: Medicare (Managed Care)

## 2017-05-10 LAB — CBC AND DIFFERENTIAL
Baso # K/uL: 0 10*3/uL (ref 0.0–0.1)
Basophil %: 0 %
Eos # K/uL: 0.6 10*3/uL — ABNORMAL HIGH (ref 0.0–0.4)
Eosinophil %: 5.3 %
Hematocrit: 25 % — ABNORMAL LOW (ref 34–45)
Hemoglobin: 7.9 g/dL — ABNORMAL LOW (ref 11.2–15.7)
Lymph # K/uL: 1.3 10*3/uL (ref 1.2–3.7)
Lymphocyte %: 10.5 %
MCH: 29 pg/cell (ref 26–32)
MCHC: 32 g/dL (ref 32–36)
MCV: 91 fL (ref 79–95)
Mono # K/uL: 0.6 10*3/uL (ref 0.2–0.9)
Monocyte %: 5.3 %
Neut # K/uL: 8.6 10*3/uL — ABNORMAL HIGH (ref 1.6–6.1)
Nucl RBC # K/uL: 0 10*3/uL (ref 0.0–0.0)
Nucl RBC %: 0 /100 WBC (ref 0.0–0.2)
Platelets: 517 10*3/uL — ABNORMAL HIGH (ref 160–370)
RBC: 2.7 MIL/uL — ABNORMAL LOW (ref 3.9–5.2)
RDW: 18.3 % — ABNORMAL HIGH (ref 11.7–14.4)
Seg Neut %: 75.3 %
WBC: 11.1 10*3/uL — ABNORMAL HIGH (ref 4.0–10.0)

## 2017-05-10 LAB — COMPREHENSIVE METABOLIC PANEL
ALT: 42 U/L — ABNORMAL HIGH (ref 0–35)
AST: 36 U/L — ABNORMAL HIGH (ref 0–35)
Albumin: 2.4 g/dL — ABNORMAL LOW (ref 3.5–5.2)
Alk Phos: 360 U/L — ABNORMAL HIGH (ref 35–105)
Anion Gap: 11 (ref 7–16)
Bilirubin,Total: 0.2 mg/dL (ref 0.0–1.2)
CO2: 23 mmol/L (ref 20–28)
Calcium: 8.3 mg/dL — ABNORMAL LOW (ref 8.6–10.2)
Chloride: 96 mmol/L (ref 96–108)
Creatinine: 0.6 mg/dL (ref 0.51–0.95)
GFR,Black: 106 *
GFR,Caucasian: 92 *
Glucose: 195 mg/dL — ABNORMAL HIGH (ref 60–99)
Lab: 20 mg/dL (ref 6–20)
Potassium: 4.4 mmol/L (ref 3.3–5.1)
Sodium: 130 mmol/L — ABNORMAL LOW (ref 133–145)
Total Protein: 6.7 g/dL (ref 6.3–7.7)

## 2017-05-10 LAB — DIFF MANUAL
Bands %: 2 % (ref 0–10)
Diff Based On: 114 CELLS
Metamyelocyte %: 1 % (ref 0–1)
React Lymph %: 1 % (ref 0–6)

## 2017-05-10 LAB — POCT GLUCOSE
Glucose POCT: 163 mg/dL — ABNORMAL HIGH (ref 60–99)
Glucose POCT: 95 mg/dL (ref 60–99)
Glucose POCT: 162 mg/dL — ABNORMAL HIGH (ref 60–99)
Glucose POCT: 143 mg/dL — ABNORMAL HIGH (ref 60–99)

## 2017-05-10 LAB — PHOSPHORUS: Phosphorus: 4.2 mg/dL (ref 2.7–4.5)

## 2017-05-10 LAB — MAGNESIUM: Magnesium: 1.9 mg/dL (ref 1.6–2.5)

## 2017-05-10 MED ORDER — FAT EMULSION SOYBEAN OIL 20 % IV EMUL *WRAPPED*
60.0000 g | Freq: Every day | INTRAVENOUS | Status: AC
Start: 2017-05-10 — End: 2017-05-11
  Administered 2017-05-10: 60 g via INTRAVENOUS
  Filled 2017-05-10: qty 300

## 2017-05-10 MED ORDER — MAGNESIUM SULFATE 2 GM IN 50 ML *WRAPPED*
2000.0000 mg | Freq: Once | INTRAVENOUS | Status: AC
Start: 2017-05-10 — End: 2017-05-10
  Administered 2017-05-10: 2000 mg via INTRAVENOUS
  Filled 2017-05-10: qty 50

## 2017-05-10 MED ORDER — SODIUM CHLORIDE 0.9 % IV SOLN WRAPPED *I*
8.0000 mg/kg | INTRAVENOUS | Status: DC
Start: 2017-05-10 — End: 2017-05-14
  Administered 2017-05-10 – 2017-05-14 (×5): 500 mg via INTRAVENOUS
  Filled 2017-05-10 (×5): qty 10

## 2017-05-10 MED ORDER — SENNOSIDES 8.6 MG PO TABS *I*
2.0000 | ORAL_TABLET | Freq: Two times a day (BID) | ORAL | Status: DC
Start: 2017-05-10 — End: 2017-06-19
  Administered 2017-05-10 – 2017-06-15 (×63): 2 via ORAL
  Filled 2017-05-10 (×67): qty 2

## 2017-05-10 MED ORDER — STERILE WATER FOR INJECTION (TPN USE ONLY) *I*
INTRAVENOUS | Status: AC
Start: 2017-05-10 — End: 2017-05-11
  Filled 2017-05-10: qty 717.6

## 2017-05-10 MED ORDER — BISACODYL 10 MG RE SUPP *I*
10.0000 mg | Freq: Every day | RECTAL | Status: AC
Start: 2017-05-10 — End: 2017-07-09
  Administered 2017-05-10 – 2017-07-07 (×31): 10 mg via RECTAL
  Filled 2017-05-10 (×2): qty 1

## 2017-05-10 NOTE — Progress Notes (Signed)
Palliative Care Progress Note    HPI: [name] w/ PMH fibromyalgia, pancreatic CA s/p Whipple for 'cure' 03/29/17; s/p ICU w/ intub for resp failure & septic shock, weaned from pressors and extubated to HFNC 2/17 w/ IR drainage bili fluid collection 2/19, ID following & receiving IV abx (caspo ends 3/2). Palliative following for symptoms, started methadone 2/19, weaned dose 2/23 d/t concern for sedation. Recent bowel clean out s/p enema & methylnaltrexone. Unfortunately course complicated by intraabdominal abscesses, s/p IR for placement of perc drains & evacuation of pus, ID following; pt remains on cyclic TPN w/ poor po intake. Trial ritalin 1x/d on 3/7 w/ fair effect, much better result noted 3/8 w/ 2x/d dosing.    Subjective: "I've been having stool, it's hard to make it to the bathroom. I have been more awake today, I've eaten a couple marshmallow peeps"    Objective: chart reviewed, according to cal count yesterday only had apple juice w/ miralax and apple sauce; writer aware pt also ate 2 cups of mandarin oranges; med reviewed, pt had ativan prn QHS though did not increase on methadone QHS yesterday as suggested - now w/ decreased IV dilaudid prn so would hold methadone at 5 mg TID for now, can always increase her QHS dose in future if noted w/ dilaudid prn use trending up; Probation officer visited w/ pt & dtr [name] late this afternoon around 5 pm and noted pt to be much more awake and interactive, chuckling at jokes and receptive to suggestions - seems the ritalin is working!    Palliative Care ROS:  Pain   Moderate  Nausea   None  Anorexia   Moderate  Anxiety   Mild  Depression   Moderate  Shortness of Breath   Mild  Tiredness/Fatigue   Mild  Drowsiness/Sleepiness   Mild  Airway Secretions   no  Constipation   no  Unable to Respond   no  Delirium   no   Last stool 3/8    Physical Examination:   BP: (96-135)/(54-80)   Temp:  [35.5 C (95.9 F)-36.7 C (98.1 F)]   Temp src: Temporal (03/08 1128)  Heart Rate:   [88-110]   Resp:  [16-24]   SpO2:  [93 %-98 %]   Gen appearance: [name], laying in bed, endorses mod constant abd pain, hasn't used as much dilaudid today and both pt/dtr noted better effect w/ ritalin 2x/d dosing today w/ more energy & slightly improved appetite  Lungs: easy resp, RA;  enc to use IS  Heart: reg  Abd: large, distended, R side perc drain x 3 and L Bili tube, stool x 2 today  Extrem: mild LE edema  Skin: pale, warm, dry  Neuro: pt much more awake and interactive today, A+O x 3, ambulates short distances w/ walker, OOB to chair stand & pivot w/ walker    Assessment/Plan: [name] w/ pancreatic CA, s/p Whipple in Jan. remains on TPN, now receiving IV abx s/p IR for R side perc drain x 3 and L side bili; continues on every other day methylnaltrexone w/ + results passing stool and gas. Pt has used total 6 mg IV dilaudid prn over last 24 hrs. Tried ritalin increase to 2x/d dosing today w/ positive effect. Pt's IV dilaudid use down today, would monitor for now & consider increase in methadone QHS dose if note increasing again.    Pain/dyspnea  Acetaminophen 1 gm po TID  Methadone 5 mg po TID (dose weaned  2/23, noted decreased IV dilaudid prn use so would hold methadone at 5 mg TID for now, can always increase her QHS dose in future if noted w/ dilaudid prn use trending up)  Dilaudid 0.5 mg IV Q 1 hr prn (last x 2 on 3/6)    Dilaudid 1 mg IV Q 1 hr prn (x 9 on 3/7, x 3 on 3/8)  Lidocaine patch daily  Encourage incentive spirometry prn     Anxiety/agitation/nausea/fatigue  Amitriptyline 25 mg po QHS  Duloxetine 20 mg po QHS (started 3/4, switched from lexapro)  Reglan 10 mg po 4x/d (AC & QHS)  Pantoprazole 40 mg po daily  Lorazepam 0.5 mg po Q 6 hrs prn (x 1 on 3/7 QHS)  Ondansetron 4 mg IV Q 6 hrs prn (last x 1 on 2/18)  Ritalin 5 mg po 2x/d at 8 am and 12 pm (increased 3/8 w/ +effect, continue for now)    Prevention of constipation, last stool 3/8  Colace 200 mg po BID  Miralax 17 gm po  BID  Senna 2 tab po BID  Bisacodyl supp pr daily (started 3/8)  Methylnaltrexone 12 mg SC every other day (started 2/26)     GOC/prognosis  Full code  Dtr Rod Mae is HCP    Pt case discussed w/ RN. We will continue to follow along, please call if questions/concerns or if pt needs to be seen over the WE.    Total Time Spent 40 minutes: >50% of time was spent in counseling and/or coordination of care.     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    Hypothyroidism E03.9    Pancreatic cancer C25.9    Anemia D64.9     hx of Pulmonary embolism I26.99    Depression F32.9    Sepsis A41.9       No Known Allergies (drug, envir, food or latex)    Scheduled Meds:    daptomycin IV  8 mg/kg Intravenous Q24H    senna  2 tablet Oral 2 times per day    bisacodyl  10 mg Rectal Daily    enoxaparin  40 mg Subcutaneous Daily    methylphenidate  5 mg Oral BID    ascorbic acid  500 mg Oral Daily with breakfast    insulin glargine  12 Units Subcutaneous Nightly    DULoxetine  20 mg Oral Daily    ertapenem  1,000 mg Intravenous Q24H    metoclopramide  10 mg Oral 4x Daily AC & HS    lidocaine  1 patch Transdermal Q24H    methylnaltrexone  12 mg Subcutaneous Every Other Day    ferrous gluconate  324 mg Oral Daily with breakfast    docusate sodium  200 mg Oral 2 times per day    polyethylene glycol  17 g Oral 2 times per day    pantoprazole  40 mg Oral QAM    caspofungin  50 mg Intravenous Q24H    acetaminophen  1,000 mg Oral Q8H    methadone  5 mg Oral Q8H    levothyroxine  137 mcg Oral Daily    amitriptyline  25 mg Oral Nightly       Continuous Infusions:    TPN (2 in  1) Adult Cyclical      And    fat emulsion soybean oil      TPN (2 in 1)  Adult Cyclical Stopped (22/63/33 0556)    sodium chloride Stopped (05/07/17 0708)       PRN Meds:  HYDROmorphone PF **OR** HYDROmorphone PF, LORazepam, heparin lock flush, ondansetron

## 2017-05-10 NOTE — Progress Notes (Signed)
Assumed care of pt at 0700. Q2 VSS throughout shift. Up to chair at 0930 and in chair for the remainder of the shift. Pt very much more awake and alert today than previous days. Pt now in better spirits than before. Pt trying very hard to eat but still does not have much of an appetite. All drains flushed per order. Tele NSR. Foley with plenty of clear, yellow output. Will pass onto oncoming shift. Blase Mess, RN

## 2017-05-10 NOTE — Consults (Signed)
Medical Nutrition Therapy - Follow Up    Admit Date: 03/29/2017    Patient Summary: Per MD note, 72 y.o. female with h/o recent PE and pancreatic cancer status post whipple procedure complicated by delayed gastric emptying.  NGT replaced on 04/04/17. UGI on 04/12/17 concerning for obstruction just beyond the [place] in the efferent limb.  Course complicated on 04/06/52 by rising LFTs and sepsis with CT concerning for cholangitis given biliary tree obstruction and PV compression due to hematoma and afferent limb distention in setting of PE treatment. Is s/p IR PTC on 04/16/17 and IR percutaneous aspiration of anterior abdominal fluid collection on 04/24/17. IR LUQ perc drain placement 3/1. IR anterior abdominal 53f perc drain placement 3/3. IR anterior perc drain into left hepatic collection 3/6.    Pertinent Meds: amitriptyline, levothyroxine, methadone, caspofungin, Protonix, bowel regimen, ferrous gluconate, Relistor, Reglan, ertapenem, Cymbalta, vitamin C, lantus, daptomycin, Ritalin, Mg sulfate   Pertinent Labs: Na 130 (L); BGs range 95-195 during most recent TPN cycle     Reviewed I/O's: 420 ml PO yesterday, -3340 ml UOP, -205 ml drain output      Enteral or parenteral access: PCVC     Food allergies: NKFA    Current diet: regular   Supplements: Ensure 1.0 TID, magic cup TID   TPN order: Cyclic 2 in 1: 1270WC/BJx 11hrs then taper to 672mhr x 1 hr  Amino acids:69g/L  Dextrose: 173g/L   Standard additives except the following:   Na: 14078mL  K: 36m53m  Ca: 11mE12m Mg: 5mEq/92mPO4: 12mmol40m200 mg levocarnitine   Maximize Chloride  Insulin: 0units/LITER  Fat Emulsion 20% infusion: 60gm/day (to be  infused over 12 hrs)  -TPN provides: 1948kcal/day, 108g protein/day, 270g dextrose/day and 1860mL fl65mday (including amino acid/dextrose solution and lipid volume).    Nutrition Focused Physical Exam:    Edema: trace generalized, +1 abdominal allergy, trace BUE, +1 BLE   Abdomen: tenderness/guarding, rounded, distended, loss of appetite, +BS, +flatus   Skin: ecchymosis/bruising, surgical wound to abdomen     Anthropometrics:  Height: 170.2 cm ('5\' 7"' )    Current Weight: 65.8 kg (145 lb 1.6 oz) (3/4); 91% IBW; 91% UBW   Ideal Body Weight: 72.4 kg + 10%  BMI: 22.7 kg/(m^2) underweight for age (ideal BMI 23-27 for age 52+)  Weight Hx: 10% loss in one month     05/06/2017 65.817 kg 145 lbs 2 oz Actual   04/09/2017 73.483 kg 162 lbs Actual         Estimated Nutrient Needs: (Based on admit wt 72 kg)    1800-2160 kcal/day (25-30 kcal/kg)   86-108 g protein/day (1.2-1.5 g/kg)    1800-2169 mL fluid/day (25-30 mL/kg)      Nutrition Assessment and Diagnosis:   Cal counts ongoing. Labs reviewed, sodium low. Met with pt, breakfast was in front of her (fruit, muffin and cottage cheese). She had not eaten any. Reports she is not drinking the Ensure. Writer encouraged intakes. Pt very lethargic. Weight loss noted, is concerning. Pt has been getting TPN for ~1 month, ? If pt has also had some muscle loss. Will need repeat weight to confirm weight loss.       TPN Approval  Approval for parenteral nutrition use to continue for 7 days.  Original Start Date: 2/5 Approved by interdisciplinary team   D/C Plan: TPN will be discontinued once pt tolerating po diet/enteral nutrition        Malnutrition Status: Pt diagnosed with moderate  malnutrition 04/09/17.     Nutrition Intervention:   1. Continue regular diet   2. Continue supplements as ordered, encourage intake   3. Continue current TPN 120m/hr x 11hrs then taper to 632mhr x 1 hr  Amino acids:69g/L  Dextrose: 173g/L   Standard  additives except the following:   Na: 1406mL  K: 38m84m  Ca: 11mE21m Mg: 5mEq/60mPO4: 12mmol97m200 mg levocarnitine   Maximize Chloride  Insulin: 0units/LITER  Fat Emulsion 20% infusion: 60gm/day (to be infused over 12 hrs)  -TPN provides: 1948kcal/day, 108g protein/day, 270g dextrose/day and 1860mL fl26mday (including amino acid/dextrose solution and lipid volume).  4. Continue to monitor fluid balance and provide non-dextrose containing IVF to replace any extraordinary losses   5. Please check BG 1 hr into TPN infusion, 2 hrs before D/C of TPN and 30 minutes after TPNinfusion is complete.  6. Continue TPN lab monitoring     Nutrition Monitoring/Evaluation:   1. Will monitor diet tolerance and intake, nutrition-related labs, weight trend, BM pattern, supplement acceptance.    2. Will follow up per high nutrition risk protocol.    Tametha Banning RoLaurence [phone]

## 2017-05-10 NOTE — Progress Notes (Signed)
[facility]:  Chart reviewed.  No tentative d/c date noted.  For weekend d/c or home care concerns please call [facility]- [phone]    Sue Odas Ozer RN, HCC  [phone]

## 2017-05-10 NOTE — Progress Notes (Signed)
Surgical Oncology/Hepatobiliary Surgery Progress Note     LOS: 42 days     Subjective:  NAE, AVSS, afebrile, on RA  Mental status stable  Complains of more abdominal pain  Denies N/V, SOB  On TPN, took in 360cc PO yesterday   -flatus, -BM yesterday     Objective:    Vitals Sign Ranges for Past 24 Hours:  BP: (100-135)/(54-78)   Temp:  [35.8 C (96.4 F)-36.7 C (98.1 F)]   Temp src: Temporal (03/08 0359)  Heart Rate:  [88-110]   Resp:  [16-24]   SpO2:  [93 %-98 %]       Physical Exam:    General Appearance: in NAD, speaking slowly/quietly  Cardiac: regular rate  Respiratory: non-labored breathing on room air  Abdomen: soft, moderately tender to palpation, distended, pre-existing ecchymosis, biliary tube clamped, LUQ drain with purulent output. Inferior anterior drain with bloody/purulent output. Superior anterior drain with SS output. New anterior (superior) midline drain with purulent output.  Extremities: warm    Labs:    CBC:    Recent Labs  Lab 05/10/17  0042 05/09/17  0100 05/08/17  0005 05/07/17  1152 05/07/17  0201 05/05/17  2329 05/05/17  0617   WBC 11.1* 11.7* 12.8*  --  11.8* 11.4* 12.1*   Hemoglobin 7.9* 7.6* 7.9*  --  6.3* 6.7* 6.6*   Hematocrit 25* 25* 25* 26* 20* 21* 21*   Platelets 517* 481* 490*  --  484* 512* 716*       Metabolic Panel:    Recent Labs  Lab 05/10/17  0042 05/09/17  0100 05/08/17  0005 05/07/17  0201 05/05/17  2329 05/05/17  0617   Sodium 130* 134 132* 134 131* 131*   Potassium 4.4 4.2 3.8 4.0 3.9 3.7   Chloride 96 99 97 100 97 98   CO2 '23 21 21 21 21 21   ' UN 20 22* 21* 23* 21* 21*   Creatinine 0.60 0.55 0.56 0.56 0.59 0.65   Glucose 195* 108* 110* 165* 122* 130*   Calcium 8.3* 8.4* 8.1* 8.4* 8.3* 8.2*   Magnesium 1.9 1.7 1.9 1.8 2.1 1.8   Phosphorus 4.2 3.8 3.5 4.0 3.6 3.1          Assessment:    72 y.o. female with h/o recent PE and pancreatic cancer POD # 42  status post whipple procedure complicated by delayed gastric emptying.  NGT replaced on 04/04/17. UGI on 04/12/17 concerning  for obstruction just beyond the [place] in the efferent limb.  Course complicated on 9/67/89 by rising LFTs and sepsis with CT concerning for cholangitis given biliary tree obstruction and PV compression due to hematoma and afferent limb distention in setting of PE treatment. Is s/p IR PTC on 04/16/17 and IR percutaneous aspiration of anterior abdominal fluid collection on 04/24/17. IR LUQ perc drain placement 3/1. IR anterior abdominal 25f perc drain placement 3/3. IR anterior perc drain into left hepatic collection 3/6.    Plan:  - Regular diet + ensures  - Calorie counts re-started today  - Cycle TPN  - Aggressive bowel regimen including methylnaltrexone  - Continue ertapenem/caspo/dapto, f/u additional culture results, appreciate ID recs  - Continue capped biliary drain  - SCDs, heparin SC (hold therapeutic anticoagulation) for VTE ppx  - Dispo: pending clinical course, IR drains    Diagnoses for this hospitalization include:  Moderate Malnutrition - on TPN, now tolerating regular diet        Sepsis - Overall clinical picture  improved  Anemia- improved s/p pRBCs 3/5      Cristie Hem, MD  05/10/2017     6:33 AM   General Surgery Resident

## 2017-05-10 NOTE — Plan of Care (Signed)
Problem: Pain/Comfort  Goal: Patient's pain or discomfort is manageable  Outcome: Maintaining      Problem: Mobility  Goal: Patient's functional status is maintained or improved  Outcome: Progressing towards goal      Problem: Cognitive function  Goal: Cognitive function will be maintained  Outcome: Progressing towards goal    Goal: Cognitive function will be maintained or return to baseline  Outcome: Progressing towards goal      Problem: Bowel Elimination  Goal: Elimination patterns are normal or improving  Outcome: Maintaining      Problem: Post-Operative Hemodynamic Stability  Goal: Maintain Hemodynamic Stability  Outcome: Maintaining      Problem: Post-Operative Complications  Goal: Patient will remain free from symptoms of infection-post op  Outcome: Maintaining    Goal: Prevent post-operative complications  Outcome: Maintaining      Problem: Fluid and Electrolyte Imbalance  Goal: Fluid and Electrolyte imbalance  Outcome: Maintaining      Problem: GI Bleeding Elimination  Goal: Elimination of patterns are normal or improving  Outcome: Maintaining

## 2017-05-11 LAB — COMPREHENSIVE METABOLIC PANEL
ALT: 48 U/L — ABNORMAL HIGH (ref 0–35)
AST: 52 U/L — ABNORMAL HIGH (ref 0–35)
Albumin: 2.5 g/dL — ABNORMAL LOW (ref 3.5–5.2)
Alk Phos: 378 U/L — ABNORMAL HIGH (ref 35–105)
Anion Gap: 11 (ref 7–16)
Bilirubin,Total: 0.3 mg/dL (ref 0.0–1.2)
CO2: 24 mmol/L (ref 20–28)
Calcium: 8.5 mg/dL — ABNORMAL LOW (ref 8.6–10.2)
Chloride: 102 mmol/L (ref 96–108)
Creatinine: 0.56 mg/dL (ref 0.51–0.95)
GFR,Black: 109 *
GFR,Caucasian: 94 *
Glucose: 127 mg/dL — ABNORMAL HIGH (ref 60–99)
Lab: 26 mg/dL — ABNORMAL HIGH (ref 6–20)
Potassium: 4.3 mmol/L (ref 3.3–5.1)
Sodium: 137 mmol/L (ref 133–145)
Total Protein: 6.9 g/dL (ref 6.3–7.7)

## 2017-05-11 LAB — CBC AND DIFFERENTIAL
Baso # K/uL: 0 10*3/uL (ref 0.0–0.1)
Basophil %: 0 %
Eos # K/uL: 0 10*3/uL (ref 0.0–0.4)
Eosinophil %: 0 %
Hematocrit: 25 % — ABNORMAL LOW (ref 34–45)
Hemoglobin: 7.9 g/dL — ABNORMAL LOW (ref 11.2–15.7)
Lymph # K/uL: 0.9 10*3/uL — ABNORMAL LOW (ref 1.2–3.7)
Lymphocyte %: 7 %
MCH: 29 pg/cell (ref 26–32)
MCHC: 32 g/dL (ref 32–36)
MCV: 91 fL (ref 79–95)
Mono # K/uL: 0.5 10*3/uL (ref 0.2–0.9)
Monocyte %: 4 %
Neut # K/uL: 9.6 10*3/uL — ABNORMAL HIGH (ref 1.6–6.1)
Nucl RBC # K/uL: 0 10*3/uL (ref 0.0–0.0)
Nucl RBC %: 0 /100 WBC (ref 0.0–0.2)
Platelets: 513 10*3/uL — ABNORMAL HIGH (ref 160–370)
RBC: 2.7 MIL/uL — ABNORMAL LOW (ref 3.9–5.2)
RDW: 18.2 % — ABNORMAL HIGH (ref 11.7–14.4)
Seg Neut %: 86 %
WBC: 11.2 10*3/uL — ABNORMAL HIGH (ref 4.0–10.0)

## 2017-05-11 LAB — POCT GLUCOSE
Glucose POCT: 111 mg/dL — ABNORMAL HIGH (ref 60–99)
Glucose POCT: 146 mg/dL — ABNORMAL HIGH (ref 60–99)
Glucose POCT: 127 mg/dL — ABNORMAL HIGH (ref 60–99)

## 2017-05-11 LAB — MAGNESIUM: Magnesium: 1.9 mg/dL (ref 1.6–2.5)

## 2017-05-11 LAB — BLOOD CULTURE
Bacterial Blood Culture: 0
Bacterial Blood Culture: 0
Bacterial Blood Culture: 0

## 2017-05-11 LAB — DIFF MANUAL
Diff Based On: 100 CELLS
Metamyelocyte %: 1 % (ref 0–1)
Myelocyte %: 1 % — ABNORMAL HIGH (ref 0–0)
React Lymph %: 1 % (ref 0–6)

## 2017-05-11 LAB — PHOSPHORUS: Phosphorus: 4.6 mg/dL — ABNORMAL HIGH (ref 2.7–4.5)

## 2017-05-11 MED ORDER — STERILE WATER FOR INJECTION (TPN USE ONLY) *I*
INTRAVENOUS | Status: AC
Start: 2017-05-11 — End: 2017-05-12
  Filled 2017-05-11: qty 717.6

## 2017-05-11 MED ORDER — METHADONE HCL 5 MG PO TABS *I*
5.0000 mg | ORAL_TABLET | Freq: Once | ORAL | Status: AC
Start: 2017-05-11 — End: 2017-05-11
  Administered 2017-05-11: 5 mg via ORAL
  Filled 2017-05-11: qty 1

## 2017-05-11 MED ORDER — FAT EMULSION SOYBEAN OIL 20 % IV EMUL *WRAPPED*
60.0000 g | Freq: Every day | INTRAVENOUS | Status: AC
Start: 2017-05-11 — End: 2017-05-12
  Administered 2017-05-11: 60 g via INTRAVENOUS
  Filled 2017-05-11: qty 300

## 2017-05-11 NOTE — Progress Notes (Signed)
[facility] ON cross cover note:      Alerted by nursing that patient was c/o 9/10 pain in abdomen despite current pain regimen. Palliative care note reviewed for suggestions for improved pain control. Additional dose of methadone 5mg  PO given x1 at [facility] for improved pain control overnight. Will make primary team aware.      Seymone Forlenza Arta Bruce, NP

## 2017-05-11 NOTE — Plan of Care (Signed)
Bowel Elimination     Elimination patterns are normal or improving Maintaining        Fluid and Electrolyte Imbalance     Fluid and Electrolyte imbalance Maintaining        GI Bleeding Elimination     Elimination of patterns are normal or improving Maintaining        Mobility     Functional status is maintained or improved - Geriatric Maintaining        Nutrition     Patient's nutritional status is maintained or improved Maintaining     Nutritional status is maintained or improved - Geriatric Maintaining        Pain/Comfort     Patient's pain or discomfort is manageable Maintaining        Post-Operative Bowel Elimination     Elimination pattern is normal or improving Maintaining        Post-Operative Complications     Patient will remain free from symptoms of infection-post op Maintaining     Prevent post-operative complications Maintaining        Post-Operative Hemodynamic Stability     Maintain Hemodynamic Stability Maintaining        Psychosocial     Demonstrates ability to cope with illness Maintaining        Safety     Patient will remain free of falls Maintaining          Cognitive function     Cognitive function will be maintained Progressing towards goal     Cognitive function will be maintained or return to baseline Progressing towards goal        Mobility     Patient's functional status is maintained or improved Progressing towards goal

## 2017-05-11 NOTE — Progress Notes (Signed)
Assumed care of pt at 0700. VSS throughout shift. Pt spent most of the day in the chair with ease. Pain meds x2. In good spirits this afternoon. Still does not have much of an appetite. Will pass onto oncoming shift. Blase Mess, RN

## 2017-05-11 NOTE — Progress Notes (Addendum)
[facility] and GI Surgery Attending for Dr. Ron Agee:    I saw and evaluated the patient. I have reviewed and edited the resident's/fellow's note and confirm the findings and plan of care as documented. Slow convalescence. Not septic, on TPN.     Charolotte Eke, MD

## 2017-05-11 NOTE — Progress Notes (Signed)
Surgical Oncology/Hepatobiliary Surgery Progress Note     LOS: 43 days     Subjective:    Calorie counts pending  AVSS  No fevers  WBC stable  Mental status stable  Denies N/V, SOB  Had BM this morning     Objective:    Vitals Sign Ranges for Past 24 Hours:  BP: (98-134)/(60-84)   Temp:  [35.5 C (95.9 F)-36.5 C (97.7 F)]   Temp src: Temporal (03/09 1002)  Heart Rate:  [83-111]   Resp:  [16-20]   SpO2:  [92 %-98 %]       Physical Exam:    General Appearance: in NAD, speaking slowly/quietly  Cardiac: regular rate  Respiratory: non-labored breathing on room air  Abdomen: soft, moderately tender to palpation, distended, pre-existing ecchymosis, biliary tube clamped, LUQ drain with purulent output. Inferior anterior drain with bloody/purulent output. Superior anterior drain with SS output. New anterior (superior) midline drain with purulent output.  Extremities: warm    Labs:    CBC:    Recent Labs  Lab 05/11/17  0045 05/10/17  0042 05/09/17  0100 05/08/17  0005 05/07/17  1152 05/07/17  0201 05/05/17  2329   WBC 11.2* 11.1* 11.7* 12.8*  --  11.8* 11.4*   Hemoglobin 7.9* 7.9* 7.6* 7.9*  --  6.3* 6.7*   Hematocrit 25* 25* 25* 25* 26* 20* 21*   Platelets 513* 517* 481* 490*  --  484* 941*       Metabolic Panel:    Recent Labs  Lab 05/11/17  0045 05/10/17  0042 05/09/17  0100 05/08/17  0005 05/07/17  0201 05/05/17  2329   Sodium 137 130* 134 132* 134 131*   Potassium 4.3 4.4 4.2 3.8 4.0 3.9   Chloride 102 96 99 97 100 97   CO2 _0 UN 26* 20 22* 21* 23* 21*   Creatinine 0.56 0.60 0.55 0.56 0.56 0.59   Glucose 127* 195* 108* 110* 165* 122*   Calcium 8.5* 8.3* 8.4* 8.1* 8.4* 8.3*   Magnesium 1.9 1.9 1.7 1.9 1.8 2.1   Phosphorus 4.6* 4.2 3.8 3.5 4.0 3.6          Assessment:    72 y.o. female with h/o recent PE and pancreatic cancer POD # 43  status post whipple procedure complicated by delayed gastric emptying.  NGT replaced on 04/04/17. UGI on 04/12/17 concerning for obstruction just beyond the [place] in the  efferent limb.  Course complicated on 7/40/81 by rising LFTs and sepsis with CT concerning for cholangitis given biliary tree obstruction and PV compression due to hematoma and afferent limb distention in setting of PE treatment. Is s/p IR PTC on 04/16/17 and IR percutaneous aspiration of anterior abdominal fluid collection on 04/24/17. IR LUQ perc drain placement 3/1. IR anterior abdominal 28f perc drain placement 3/3. IR anterior perc drain into left hepatic collection 3/6.    Plan:  - Regular diet + ensures  - Calorie counts  - Cycle TPN  - Aggressive bowel regimen including methylnaltrexone  - Continue ertapenem/caspo/dapto, f/u additional culture results, appreciate ID recs  - Continue capped biliary drain  - SCDs, heparin SC (hold therapeutic anticoagulation) for VTE ppx  - Dispo: pending clinical course, IR drains        KRudene Re MD  05/11/2017     10:40 AM   General Surgery Resident

## 2017-05-12 LAB — CBC AND DIFFERENTIAL
Baso # K/uL: 0.1 10*3/uL (ref 0.0–0.1)
Basophil %: 0.7 %
Eos # K/uL: 0.3 10*3/uL (ref 0.0–0.4)
Eosinophil %: 2.4 %
Hematocrit: 24 % — ABNORMAL LOW (ref 34–45)
Hemoglobin: 7.8 g/dL — ABNORMAL LOW (ref 11.2–15.7)
IMM Granulocytes #: 0.5 10*3/uL
IMM Granulocytes: 4.5 %
Lymph # K/uL: 1.2 10*3/uL (ref 1.2–3.7)
Lymphocyte %: 10.9 %
MCH: 29 pg/cell (ref 26–32)
MCHC: 32 g/dL (ref 32–36)
MCV: 91 fL (ref 79–95)
Mono # K/uL: 0.7 10*3/uL (ref 0.2–0.9)
Monocyte %: 6.5 %
Neut # K/uL: 8.5 10*3/uL — ABNORMAL HIGH (ref 1.6–6.1)
Nucl RBC # K/uL: 0 10*3/uL (ref 0.0–0.0)
Nucl RBC %: 0 /100 WBC (ref 0.0–0.2)
Platelets: 524 10*3/uL — ABNORMAL HIGH (ref 160–370)
RBC: 2.7 MIL/uL — ABNORMAL LOW (ref 3.9–5.2)
RDW: 17.9 % — ABNORMAL HIGH (ref 11.7–14.4)
Seg Neut %: 75 %
WBC: 11.3 10*3/uL — ABNORMAL HIGH (ref 4.0–10.0)

## 2017-05-12 LAB — COMPREHENSIVE METABOLIC PANEL
ALT: 49 U/L — ABNORMAL HIGH (ref 0–35)
AST: 45 U/L — ABNORMAL HIGH (ref 0–35)
Albumin: 2.3 g/dL — ABNORMAL LOW (ref 3.5–5.2)
Alk Phos: 386 U/L — ABNORMAL HIGH (ref 35–105)
Anion Gap: 12 (ref 7–16)
Bilirubin,Total: 0.2 mg/dL (ref 0.0–1.2)
CO2: 23 mmol/L (ref 20–28)
Calcium: 8.5 mg/dL — ABNORMAL LOW (ref 8.6–10.2)
Chloride: 97 mmol/L (ref 96–108)
Creatinine: 0.56 mg/dL (ref 0.51–0.95)
GFR,Black: 109 *
GFR,Caucasian: 94 *
Glucose: 122 mg/dL — ABNORMAL HIGH (ref 60–99)
Lab: 23 mg/dL — ABNORMAL HIGH (ref 6–20)
Potassium: 4.2 mmol/L (ref 3.3–5.1)
Sodium: 132 mmol/L — ABNORMAL LOW (ref 133–145)
Total Protein: 6.6 g/dL (ref 6.3–7.7)

## 2017-05-12 LAB — PHOSPHORUS: Phosphorus: 4.1 mg/dL (ref 2.7–4.5)

## 2017-05-12 LAB — AEROBIC CULTURE

## 2017-05-12 LAB — POCT GLUCOSE: Glucose POCT: 98 mg/dL (ref 60–99)

## 2017-05-12 LAB — MAGNESIUM: Magnesium: 1.6 mg/dL (ref 1.6–2.5)

## 2017-05-12 MED ORDER — FAT EMULSION SOYBEAN OIL 20 % IV EMUL *WRAPPED*
60.0000 g | Freq: Every day | INTRAVENOUS | Status: AC
Start: 2017-05-12 — End: 2017-05-13
  Administered 2017-05-12: 60 g via INTRAVENOUS
  Filled 2017-05-12: qty 300

## 2017-05-12 MED ORDER — STERILE WATER FOR INJECTION (TPN USE ONLY) *I*
INTRAVENOUS | Status: AC
Start: 2017-05-12 — End: 2017-05-13
  Filled 2017-05-12: qty 717.6

## 2017-05-12 NOTE — Progress Notes (Signed)
Daylight saving time occurred on this day. Events and notes between 1:00AM and 3:00AM may be out of order in the chart, where possible documentation was adjusted accordingly.            Cammie Mcgee, RN

## 2017-05-12 NOTE — Plan of Care (Signed)
Bowel Elimination     Elimination patterns are normal or improving Maintaining        Fluid and Electrolyte Imbalance     Fluid and Electrolyte imbalance Maintaining        GI Bleeding Elimination     Elimination of patterns are normal or improving Maintaining        Mobility     Functional status is maintained or improved - Geriatric Maintaining     Patient's functional status is maintained or improved Maintaining        Nutrition     Patient's nutritional status is maintained or improved Maintaining     Nutritional status is maintained or improved - Geriatric Maintaining        Pain/Comfort     Patient's pain or discomfort is manageable Maintaining        Post-Operative Bowel Elimination     Elimination pattern is normal or improving Maintaining        Post-Operative Complications     Patient will remain free from symptoms of infection-post op Maintaining     Prevent post-operative complications Maintaining        Post-Operative Hemodynamic Stability     Maintain Hemodynamic Stability Maintaining        Psychosocial     Demonstrates ability to cope with illness Maintaining        Safety     Patient will remain free of falls Maintaining

## 2017-05-12 NOTE — Progress Notes (Signed)
Amanda Galloway is currently functioning below her baseline and will likely require SNF rehab before returning to she prior living arrangement. she will likely return to her prior level of functioning with a short term rehab stay. However, if discharge directly to home is desired, Amanda Galloway will need 24 hour assistance with all mobility and require Home PT     PT Discharge Equipment Recommended: None if returning to their prior living environment today.    Physical Therapy Treatment Note:     05/12/17 1010   PT Tracking   PT TRACKING PT Assigned   Visit Number   Visit Number [facility]) / Treatment Day (HH) 6   Precautions/Observations   Precautions used Yes   LDA Observation Foley cath;Drain;Monitors;Central Line   Fall Precautions General falls precautions   Other Pt supine in bed, seen with RN permission. Pt appeared overall lethargic but willing to participate with PT   Pain Assessment   *Is the patient currently in pain? Yes   Pain (Before,During, After) Therapy During   0-10 Scale (all over; generalized)   Pain Location Generalized   Pain Intervention(s) Refer to nursing for pain management;Repositioned   Additional comments During standing activities pt reported hurting all over, with encouragement able to assist with calming of pt. Pt reported being sensitive close to area of drain on left flank.   Cognition   Arousal/Alertness Delayed responses to stimuli   Orientation Oriented to person;Oriented to place;Disoriented to time;Disoriented to situation   Following Commands Follows simple commands with increased time;Follows simple commands with repetition  (pt willing to do so)   Additional Comments Pt appeared overall lethargic, but wanting to participate with PT. Intermittent encouragement.   Bed Mobility   Bed mobility Tested   Supine to Sit Minimum assist ;1 person assist;Verbal cues;Side rails up (#);Head of bed elevated   Sit to Supine Not tested  (up in recliner at end of session)   Additional comments Pt  able to bring BLE towards edge of bed without assist to prepare for transfer. Writer provided VCs for sequencing, assist to reach for bed rail to assist with transfer. With use of rail, pt able to pull self with writer assisting with minAx1 at upper trunk to bring into upright position.   Transfers   Transfers Tested   Stand Pivot Transfers Moderate assistance;1 person   Sit to Stand Moderate ;1 person assist   Stand to sit Moderate ;1 person assist   Transfer Assistive Device (hand held)   Additional comments Pt completed x3 sit to stand transfers and 1 stand pivot requriing modAx1 from Probation officer at [employer] held support. During sit to stand pt requried hand held during transition and use of bed rail, Probation officer assisted with forward weight shift and at trunk to facilitate into standing position. Upon initial standing pt noted with posterior lean, requried VCs and increased assist at trunk. Pt able to correct with assist and maintain for short time. During pivot, pt took shuffling steps, minimally clearing from floor   Mobility   Mobility Tested   Gait Pattern Shuffle;Decreased R step length;Decreased R step height;Decreased L step length;Decreased L step height  (mild posterior lean)   Additional comments Pt completed short shuffling steps during stand pivot, requried hand held assist and support at trunk d/t posterior leab. Increased VCs for sequencing.   Therapeutic Exercises   Exercises Performed LE   Heel Slides, Supine Bilaterally   Heel Slides, Supine-Assist Resisted   Heel Slides, Supine-Reps 10  Knee Extension, Sitting (LAQ) Bilaterally   Knee Ext, Sitting (LAQ)-Assist Active   Knee Ext, Sitting (LAQ)-Reps 10   SLR Bilaterally   SLR-Assist Active   SLR-Reps 10   Balance   Balance Tested   Sitting - Static Standby assist;Unsupported   Sitting - Dynamic Contact guard;Unsupported   Standing - Static Min assist;Supported   Standing - Dynamic Mod assist;Supported   PT AM-PAC Mobility   Turning over in bed? 1    Sitting down on and standing up from a chair with arms? 1   Moving from lying on back to sitting on the side of the bed? 1   Moving to and from a bed to a chair? 2   Need to walk in hospital room? 1   Climbing 3 - 5 steps with a railing? 1   Total Raw Score 7   Standardized Score 26.42   CMS 1-100% Score 92   Assessment   Brief Assessment Remains appropriate for skilled therapy   Problem List Impaired functional mobility   Patient / Family Goal rehab   Additional Comments Pt willing to participate with PT, following all of writer's requests. Pt is currently limited by pain, weakness, balance, and fatigue. Pt only able to tolerate transfer training at this time. She will benefit from rehab bout to address impairments and maximize independence.   Plan/Recommendation   Treatment Interventions [facility]   PT Frequency 2-4x/wk   Mobility Recommendations Stand pivot trasnfers with 1A   Discharge Recommendations [facility]   Assessment/Recommendations Reviewed With: Patient;Nursing   Next PT Visit progressive ambulation, standing activities   Time Calculation   PT Timed Codes 28   PT Untimed Codes 0   PT Unbilled Time 5   PT Total Treatment 28   Plan and Onset date   Plan of Care Date 05/12/17   Treatment Start Date 04/21/17   PT Charges   $PT [facility] Charges Functional Training - code 0239 (10min);THER EX - code 0258 (4min)     Rodena Piety, PT, DPT  Pager [phone]

## 2017-05-12 NOTE — Progress Notes (Addendum)
Surgical Oncology/Hepatobiliary Surgery Progress Note     LOS: 44 days     Subjective:    Calorie counts pending  Tachy to 110s otherwise AVSS  No fevers  WBC stable  Mental status stable  Denies N/V, SOB, CP     Objective:    Vitals Sign Ranges for Past 24 Hours:  BP: (102-124)/(56-84)   Temp:  [36 C (96.8 F)-36.5 C (97.7 F)]   Temp src: Temporal (03/10 0819)  Heart Rate:  [96-113]   Resp:  [16-18]   SpO2:  [92 %-96 %]       Physical Exam:    General Appearance: in NAD, speaking slowly/quietly  Cardiac: regular rate  Respiratory: non-labored breathing on room air  Abdomen: soft, moderately tender to palpation, distended, pre-existing ecchymosis, biliary tube clamped, LUQ drain with purulent output. Inferior anterior drain with bloody/purulent output. Superior anterior drain with SS output. New anterior (superior) midline drain with purulent output.  Extremities: warm    Labs:    CBC:    Recent Labs  Lab 05/12/17  0152 05/11/17  0045 05/10/17  0042 05/09/17  0100 05/08/17  0005 05/07/17  1152 05/07/17  0201   WBC 11.3* 11.2* 11.1* 11.7* 12.8*  --  11.8*   Hemoglobin 7.8* 7.9* 7.9* 7.6* 7.9*  --  6.3*   Hematocrit 24* 25* 25* 25* 25* 26* 20*   Platelets 524* 513* 517* 481* 490*  --  920*       Metabolic Panel:    Recent Labs  Lab 05/12/17  0152 05/11/17  0045 05/10/17  0042 05/09/17  0100 05/08/17  0005 05/07/17  0201   Sodium 132* 137 130* 134 132* 134   Potassium 4.2 4.3 4.4 4.2 3.8 4.0   Chloride 97 102 96 99 97 100   CO2 _0 UN 23* 26* 20 22* 21* 23*   Creatinine 0.56 0.56 0.60 0.55 0.56 0.56   Glucose 122* 127* 195* 108* 110* 165*   Calcium 8.5* 8.5* 8.3* 8.4* 8.1* 8.4*   Magnesium 1.6 1.9 1.9 1.7 1.9 1.8   Phosphorus 4.1 4.6* 4.2 3.8 3.5 4.0          Assessment:    72 y.o. female with h/o recent PE and pancreatic cancer POD # 44  status post whipple procedure complicated by delayed gastric emptying.  NGT replaced on 04/04/17. UGI on 04/12/17 concerning for obstruction just beyond the [place] in the  efferent limb.  Course complicated on 1/00/71 by rising LFTs and sepsis with CT concerning for cholangitis given biliary tree obstruction and PV compression due to hematoma and afferent limb distention in setting of PE treatment. Is s/p IR PTC on 04/16/17 and IR percutaneous aspiration of anterior abdominal fluid collection on 04/24/17. IR LUQ perc drain placement 3/1. IR anterior abdominal 64f perc drain placement 3/3. IR anterior perc drain into left hepatic collection 3/6.    Plan:  - Regular diet + ensures  - Calorie counts  - Cycle TPN, plan to place Dobhoff tube today unless significantly improved PO intake  - Aggressive bowel regimen including methylnaltrexone  - Continue ertapenem/caspo/dapto, f/u additional culture results, appreciate ID recs  - Continue capped biliary drain  - SCDs, Lovenox for VTE ppx (hold therapeutic anticoagulation)   - Dispo: pending clinical course, IR drains    NCristie Hem MD  05/12/2017     8:44 AM   General Surgery Resident    [facility] and GI Surgery  Attending:    I saw and evaluated the patient. I have reviewed and edited the resident's/fellow's note and confirm the findings and plan of care as documented above. Improving. Discussed nasoenteric tube. She wishes to try Ensure PO. Agrees to try to drink 3.    Charolotte Eke, MD

## 2017-05-12 NOTE — Plan of Care (Signed)
Impaired Ambulation    • STG - IMPROVE AMBULATION Progressing towards therapy goal; extend goal x 5 more visits        Impaired Bed Mobility    • STG - IMPROVE BED MOBILITY Progressing towards therapy goal; extend goal x 5 more visits        Impaired Transfers    • STG - IMPROVE TRANSFERS Progressing towards therapy goal; extend goal x 5 more visits

## 2017-05-12 NOTE — Plan of Care (Signed)
Cognitive function     Cognitive function will be maintained Completed or Resolved     Cognitive function will be maintained or return to baseline Completed or Resolved          Fluid and Electrolyte Imbalance     Fluid and Electrolyte imbalance Maintaining        GI Bleeding Elimination     Elimination of patterns are normal or improving Maintaining        Mobility     Functional status is maintained or improved - Geriatric Maintaining        Nutrition     Patient's nutritional status is maintained or improved Maintaining     Nutritional status is maintained or improved - Geriatric Maintaining        Pain/Comfort     Patient's pain or discomfort is manageable Maintaining        Post-Operative Bowel Elimination     Elimination pattern is normal or improving Maintaining        Post-Operative Complications     Patient will remain free from symptoms of infection-post op Maintaining     Prevent post-operative complications Maintaining        Post-Operative Hemodynamic Stability     Maintain Hemodynamic Stability Maintaining        Psychosocial     Demonstrates ability to cope with illness Maintaining        Safety     Patient will remain free of falls Maintaining          Bowel Elimination     Elimination patterns are normal or improving Progressing towards goal        Mobility     Patient's functional status is maintained or improved Progressing towards goal

## 2017-05-13 DIAGNOSIS — A491 Streptococcal infection, unspecified site: Secondary | ICD-10-CM

## 2017-05-13 DIAGNOSIS — Z1621 Resistance to vancomycin: Secondary | ICD-10-CM

## 2017-05-13 LAB — CBC AND DIFFERENTIAL
Baso # K/uL: 0.1 10*3/uL (ref 0.0–0.1)
Basophil %: 0.6 %
Eos # K/uL: 0.3 10*3/uL (ref 0.0–0.4)
Eosinophil %: 2.3 %
Hematocrit: 24 % — ABNORMAL LOW (ref 34–45)
Hemoglobin: 7.7 g/dL — ABNORMAL LOW (ref 11.2–15.7)
IMM Granulocytes #: 0.3 10*3/uL
IMM Granulocytes: 2.5 %
Lymph # K/uL: 1.3 10*3/uL (ref 1.2–3.7)
Lymphocyte %: 12 %
MCH: 30 pg/cell (ref 26–32)
MCHC: 32 g/dL (ref 32–36)
MCV: 93 fL (ref 79–95)
Mono # K/uL: 0.7 10*3/uL (ref 0.2–0.9)
Monocyte %: 6.7 %
Neut # K/uL: 8.2 10*3/uL — ABNORMAL HIGH (ref 1.6–6.1)
Nucl RBC # K/uL: 0 10*3/uL (ref 0.0–0.0)
Nucl RBC %: 0 /100 WBC (ref 0.0–0.2)
Platelets: 504 10*3/uL — ABNORMAL HIGH (ref 160–370)
RBC: 2.6 MIL/uL — ABNORMAL LOW (ref 3.9–5.2)
RDW: 17.9 % — ABNORMAL HIGH (ref 11.7–14.4)
Seg Neut %: 75.9 %
WBC: 10.8 10*3/uL — ABNORMAL HIGH (ref 4.0–10.0)

## 2017-05-13 LAB — COMPREHENSIVE METABOLIC PANEL
ALT: 54 U/L — ABNORMAL HIGH (ref 0–35)
AST: 52 U/L — ABNORMAL HIGH (ref 0–35)
Albumin: 2.5 g/dL — ABNORMAL LOW (ref 3.5–5.2)
Alk Phos: 437 U/L — ABNORMAL HIGH (ref 35–105)
Anion Gap: 12 (ref 7–16)
Bilirubin,Total: 0.2 mg/dL (ref 0.0–1.2)
CO2: 23 mmol/L (ref 20–28)
Calcium: 8.7 mg/dL (ref 8.6–10.2)
Chloride: 98 mmol/L (ref 96–108)
Creatinine: 0.58 mg/dL (ref 0.51–0.95)
GFR,Black: 107 *
GFR,Caucasian: 93 *
Glucose: 246 mg/dL — ABNORMAL HIGH (ref 60–99)
Lab: 22 mg/dL — ABNORMAL HIGH (ref 6–20)
Potassium: 4.4 mmol/L (ref 3.3–5.1)
Sodium: 133 mmol/L (ref 133–145)
Total Protein: 6.8 g/dL (ref 6.3–7.7)

## 2017-05-13 LAB — PHOSPHORUS: Phosphorus: 4.7 mg/dL — ABNORMAL HIGH (ref 2.7–4.5)

## 2017-05-13 LAB — POCT GLUCOSE
Glucose POCT: 136 mg/dL — ABNORMAL HIGH (ref 60–99)
Glucose POCT: 102 mg/dL — ABNORMAL HIGH (ref 60–99)
Glucose POCT: 140 mg/dL — ABNORMAL HIGH (ref 60–99)

## 2017-05-13 LAB — PREALBUMIN: Prealbumin: 12 mg/dL — ABNORMAL LOW (ref 20–40)

## 2017-05-13 LAB — MAGNESIUM: Magnesium: 1.6 mg/dL (ref 1.6–2.5)

## 2017-05-13 LAB — ANAEROBIC CULTURE: Anaerobic Culture: 0

## 2017-05-13 MED ORDER — STERILE WATER FOR INJECTION (TPN USE ONLY) *I*
INTRAVENOUS | Status: AC
Start: 2017-05-13 — End: 2017-05-14
  Filled 2017-05-13: qty 717.6

## 2017-05-13 MED ORDER — METHADONE HCL 5 MG PO TABS *I*
5.0000 mg | ORAL_TABLET | Freq: Every day | ORAL | Status: DC
Start: 2017-05-14 — End: 2017-05-26
  Administered 2017-05-14 – 2017-05-26 (×12): 5 mg via ORAL
  Filled 2017-05-13 (×14): qty 1

## 2017-05-13 MED ORDER — FAT EMULSION SOYBEAN OIL 20 % IV EMUL *WRAPPED*
60.0000 g | Freq: Every day | INTRAVENOUS | Status: AC
Start: 2017-05-13 — End: 2017-05-14
  Administered 2017-05-13: 60 g via INTRAVENOUS
  Filled 2017-05-13: qty 300

## 2017-05-13 MED ORDER — CALCIUM CARBONATE ANTACID 500 MG PO CHEW *I*
1000.0000 mg | CHEWABLE_TABLET | Freq: Two times a day (BID) | ORAL | Status: DC | PRN
Start: 2017-05-13 — End: 2017-06-25
  Administered 2017-05-27: 1000 mg via ORAL
  Filled 2017-05-13: qty 2

## 2017-05-13 MED ORDER — VITAMIN C 500 MG PO TABS *I*
500.0000 mg | ORAL_TABLET | Freq: Every day | ORAL | Status: AC
Start: 2017-05-14 — End: 2017-05-26
  Administered 2017-05-14 – 2017-05-26 (×13): 500 mg via ORAL
  Filled 2017-05-13 (×13): qty 1

## 2017-05-13 MED ORDER — MAGNESIUM SULFATE 2 GM IN 50 ML *WRAPPED*
2000.0000 mg | Freq: Once | INTRAVENOUS | Status: AC
Start: 2017-05-13 — End: 2017-05-13
  Administered 2017-05-13: 2000 mg via INTRAVENOUS
  Filled 2017-05-13: qty 50

## 2017-05-13 MED ORDER — METHADONE HCL 5 MG PO TABS *I*
7.5000 mg | ORAL_TABLET | Freq: Two times a day (BID) | ORAL | Status: DC
Start: 2017-05-13 — End: 2017-05-26
  Administered 2017-05-13 – 2017-05-25 (×25): 7.5 mg via ORAL
  Filled 2017-05-13 (×25): qty 2

## 2017-05-13 MED ORDER — ALTEPLASE 2 MG IJ SOLR *I*
0.5000 mg | Freq: Once | INTRAMUSCULAR | Status: AC
Start: 2017-05-13 — End: 2017-05-13
  Administered 2017-05-13: 2 mg
  Filled 2017-05-13: qty 2

## 2017-05-13 MED ORDER — FERROUS SULFATE 300 (60 FE) MG/5ML PO WRAPPED *I*
300.0000 mg | ORAL_SOLUTION | Freq: Every day | ORAL | Status: AC
Start: 2017-05-13 — End: 2017-05-18
  Administered 2017-05-13 – 2017-05-17 (×4): 300 mg via ORAL
  Filled 2017-05-13 (×7): qty 5

## 2017-05-13 NOTE — Progress Notes (Addendum)
Surgical Oncology/Hepatobiliary Surgery Progress Note     LOS: 45 days     Subjective:    "Feeling sub-par"  Calorie counts pending, improved PO intake  Tachy to 110s otherwise AVSS, afebrile, on RA  WBC stable  Mental status stable  Denies N/V, SOB, CP     Objective:    Vitals Sign Ranges for Past 24 Hours:  BP: (98-150)/(56-90)   Temp:  [35.8 C (96.4 F)-36.5 C (97.7 F)]   Temp src: Temporal (03/11 0402)  Heart Rate:  [95-107]   Resp:  [16-18]   SpO2:  [92 %-95 %]       Physical Exam:    General Appearance: in NAD, speaking slowly/quietly  Cardiac: regular rate  Respiratory: non-labored breathing on room air  Abdomen: soft, moderately tender to palpation, distended, pre-existing ecchymosis, biliary tube clamped, LUQ drain with purulent output. Inferior anterior drain with bloody/purulent output. Superior anterior drain with SS output. Anterior (inferior) midline drain with purulent output.  Extremities: warm    Labs:    CBC:    Recent Labs  Lab 05/13/17  0104 05/12/17  0152 05/11/17  0045 05/10/17  0042 05/09/17  0100 05/08/17  0005   WBC 10.8* 11.3* 11.2* 11.1* 11.7* 12.8*   Hemoglobin 7.7* 7.8* 7.9* 7.9* 7.6* 7.9*   Hematocrit 24* 24* 25* 25* 25* 25*   Platelets 504* 524* 513* 517* 481* 831*       Metabolic Panel:    Recent Labs  Lab 05/13/17  0104 05/12/17  0152 05/11/17  0045 05/10/17  0042 05/09/17  0100 05/08/17  0005   Sodium 133 132* 137 130* 134 132*   Potassium 4.4 4.2 4.3 4.4 4.2 3.8   Chloride 98 97 102 96 99 97   CO2 '23 23 24 23 21 21   ' UN 22* 23* 26* 20 22* 21*   Creatinine 0.58 0.56 0.56 0.60 0.55 0.56   Glucose 246* 122* 127* 195* 108* 110*   Calcium 8.7 8.5* 8.5* 8.3* 8.4* 8.1*   Magnesium 1.6 1.6 1.9 1.9 1.7 1.9   Phosphorus 4.7* 4.1 4.6* 4.2 3.8 3.5          Assessment:    72 y.o. female with h/o recent PE and pancreatic cancer POD # 45  status post whipple procedure complicated by delayed gastric emptying.  NGT replaced on 04/04/17. UGI on 04/12/17 concerning for obstruction just beyond the [place]  in the efferent limb.  Course complicated on 07/20/59 by rising LFTs and sepsis with CT concerning for cholangitis given biliary tree obstruction and PV compression due to hematoma and afferent limb distention in setting of PE treatment. Is s/p IR PTC on 04/16/17 and IR percutaneous aspiration of anterior abdominal fluid collection on 04/24/17. IR LUQ perc drain placement 3/1. IR anterior abdominal 63f perc drain placement 3/3. IR anterior perc drain into left hepatic collection 3/6.    Plan:  - Regular diet + ensures  - Calorie counts  - Cycle TPN, continue to encourage PO/enteral nutrition  - Aggressive bowel regimen including methylnaltrexone  - Continue ertapenem/caspo/dapto, f/u additional culture results, appreciate ID recs  - Continue capped biliary drain  - SCDs, Lovenox for VTE ppx (hold therapeutic anticoagulation)   - Dispo: pending clinical course, IR drains    NCristie Hem MD  05/13/2017     7:08 AM   General Surgery Resident        HPB-GI Attending Addendum:   I personally examined the patient, reviewed the notes, and  discussed the plan of care with the residents and patient. Agree with detailed resident's note. Please see the note above for details of history, exam, labs, assessment/plan which reflect my input.         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 Abdominal abscess on imaging. Drains placed with purulent drainage. SIRS without sepsis. Improving.      Anorexia. Encouraging oral intake. Will attempt Ensure TID.   Hyperglycemia with insulin sliding scale and recurrent need for insulin. continue   Post operative anemia. No evidence of bleeding.   Bilateral pleural effusions with lung compression. Left greater than right. Monitor for now.  Pulmonary embolism. Present on admission. No evidence of worsening.  Continue to hold anticoagulation.  Hypoalbuminemia. Moderate malnutrition.  TPN. Encourage oral intake. Reglan. Nutritional supplementation.  Depression. Encouragement provided. Trial of new anti-depressant therapy.  Counseling. Daily encouragement.  Leukocytosis. VRE. Antibiotics transitioned due to resistant organisms. Stable.   Multiple intraabdominal abscesses with Enterobacter cloacae complex bacteremia and fungus.   Drains placed with purulent output. Drain daily  - VRE and Enterobacter cloacae bacteremia due to the intra-abdominal source  Constipation . Aggressive regimen.     Will repeat CT scan tomorrow.      Delano Metz, MD, FACS  Hepatobiliary, Pancreas & GI Surgery

## 2017-05-13 NOTE — Consults (Signed)
Medical Nutrition Therapy Brief Note: TPN check    Patient remains on TPN for nutrition support. Chart, labs, medications / TPN order, I/Os reviewed. Cal counts finished, plan to place NGT if insufficient intakes. Na 133 (WNL), K+ 4.4 (WNL), Cl 98 (WNL), CO2 23 (WNL), Ca 8.7 (WNL), PO4 4.7 (H, trending up, will see how it trends tomorrow), Mg 1.6 (WNL). Recent t bili 0.2 (WNL) from 3/11. BGs ranging from 98 - 246 over the past 24 hours.  MAR reviewed with repletions noted for Mg sulfate. I/Os significant for 720 ml PO yesterday, -2450 ml UOP, -125 ml drain output. Current TPN order reviewed - no changes to TPN order needed at this time.    Recommendations:  1. Continue regular diet   2. Continue supplements as ordered, encourage intake   3. Continue current TPN 119mL/hr x 11hrs then taper to 38mL/hr x 1 hr  Amino acids:69g/L  Dextrose: 173g/L   Standard additives except the following:   Na: 138mEq/L  K: 45mEq/L  Ca: 11mEq/L  Mg: 27mEq/L  PO4: 24mmol/L  200 mg levocarnitine   Maximize Chloride  Insulin: 0units/LITER  Fat Emulsion 20% infusion: 60gm/day (to be infused over 12 hrs)  -TPN provides: 1948kcal/day, 108g protein/day, 270g dextrose/day and 1824mL fluid/day (including amino acid/dextrose solution and lipid volume).  4. Continue to monitor fluid balance and provide non-dextrose containing IVF to replace any extraordinary losses   5. Please check BG 1 hr into TPN infusion, 2 hrs before D/C of TPN and 30 minutes after TPNinfusion is complete.  6. Continue TPN lab monitoring  7. Will f/u with calorie counts     Will continue to monitor.     Laurence Aly, [place]  Pager [phone]

## 2017-05-13 NOTE — Progress Notes (Signed)
Palliative Care Progress Note    HPI: [name] w/ PMH fibromyalgia, pancreatic CA s/p Whipple for 'cure' 03/29/17; s/p ICU w/ intub for resp failure & septic shock, weaned from pressors and extubated to HFNC 2/17 w/ IR drainage bili fluid collection 2/19, ID following & receiving IV abx (caspo ends 3/2). Palliative following for symptoms, started methadone 2/19, weaned dose 2/23 d/t concern for sedation. Recent bowel clean out s/p enema & methylnaltrexone. Unfortunately course complicated by intraabdominal abscesses, s/p IR for placement of perc drains & evacuation of pus, ID following; pt remains on cyclic TPN w/ poor po intake. Trial ritalin 1x/d on 3/7 w/ fair effect, much better result noted 3/8 w/ 2x/d dosing. Pt w/ ongoing poor po intake, nutrition rec fdg tube.    Subjective: "I ate a grape. I'm not sure how I'm doing, I can tell you I wish I was doing better than this"    Objective: chart reviewed, nutrition note reviewed calorie counts w/ pt only taking 16% caloric needs and only 5% of protein needs, rec'd fdg tube for postpyloric TFs; pt received additional 5 mg po methadone x 1 on 3/9 at 1020 pm for poor pain control though remains on baseline methadone 5 mg po TID - pt reporting poor pain control especially in afternoon and overnight, dtr [name] at bedside and advocating that we increase afternoon dose as she had thought pt's eve dose was increased last week; reviewed pt taking a little less IV dilaudid over last 24 hrs though still 5 mg which is equivalent to 100 mg PO morphine & warrants increase in methadone dose - rec'd increase conservatively the afternoon and QHS dose to 7.5 mg (5/7.5/7.5) for total 20 mg/d; reviewed pt's cal counts and nutrition rec for fdg tube, pt understanding that despite her best attempts she is no where close to taking in her caloric needs and TPN not good source of protein which she needs for maintaining tissue integrity and healing; encouraged pt to consider temporary fdg  tube and would have option of stopping if she really found it intolerable, reviewed difference between harder larger bore NGT she had in place in past and softer smaller dubhoff fdg tube; Probation officer reviewed suggestions & relayed this conversation w/ pt's covering provider     Palliative Care ROS:  Pain   Moderate  Nausea   Mild  Anorexia   Moderate  Anxiety   Mild  Depression   Moderate  Shortness of Breath   Mild  Tiredness/Fatigue   Mild  Drowsiness/Sleepiness   Mild  Airway Secretions   no  Constipation   yes  Unable to Respond   no  Delirium   no   Last stool 3/9    Physical Examination:   BP: (118-150)/(62-90)   Temp:  [35.8 C (96.4 F)-36.5 C (97.7 F)]   Temp src: Temporal (03/11 0744)  Heart Rate:  [97-107]   Resp:  [12-20]   SpO2:  [92 %-96 %]   Gen appearance: [name], sitting up in recliner, reports mod abd pain and poor appetite, dozing a bit though had recently received IV dilaudid   Lungs: easy resp, RA;  enc to use IS  Heart: reg  Abd: large, distended, R side perc drain x 3 and L Bili tube, stool last on 3/9  Extrem: mild LE edema  Skin: pale, warm, dry  Neuro: pt dozing and less interactive today though had recently received IV dilaudid, A+O x 3, at baseline ambulates short distances  w/ walker, OOB to chair stand & pivot w/ walker    Assessment/Plan: [name] w/ pancreatic CA, s/p Whipple in Jan. remains on TPN, now receiving IV abx s/p IR for R side perc drain x 3 and L side bili; continues on every other day methylnaltrexone w/ + results passing stool and gas. Pt has used total 5 mg IV dilaudid prn over last 24 hrs - warrants conservative increase in methadone to total 20 mg/d. Pt encouraged to consider fdg tube for trickle TFs as she has not significantly improved her appetite over last few weeks and is in line dispo plan now for SNF Rehab.    Pain/dyspnea  Acetaminophen 1 gm po TID  Methadone 5 mg po TID - rec increasing to 5/7.5/7.5 for total 20 mg/d (3/1 ECG w/ QTc  430)  Dilaudid 0.5 mg IV Q 1 hr prn (last x 2 on 3/6)    Dilaudid 1 mg IV Q 1 hr prn (x 5 on 3/10, x 4 on 3/11)  Lidocaine patch daily  Encourage incentive spirometry prn     Anxiety/agitation/nausea/fatigue  Amitriptyline 25 mg po QHS  Duloxetine 20 mg po QHS (started 3/4, switched from lexapro)  Reglan 10 mg po 4x/d (AC & QHS)  Pantoprazole 40 mg po daily  Lorazepam 0.5 mg po Q 6 hrs prn (last x 1 on 3/7 QHS)  Ondansetron 4 mg IV Q 6 hrs prn (last x 1 on 3/10)  Ritalin 5 mg po 2x/d at 8 am and 12 pm (increased 3/8 w/ +effect, continue for now)    Prevention of constipation, last stool 3/9  Colace 200 mg po BID  Miralax 17 gm po BID  Senna 2 tab po BID  Bisacodyl supp pr daily (started 3/8 and hasn't received since)  Methylnaltrexone 12 mg SC every other day (started 2/26)     GOC/prognosis  Full code  Dtr Rod Mae is HCP  Dispo plans now for SNF Rehab (before dispo pt will need to either be taking enough PO or will need fdg tube)    Pt case discussed w/ RN and covering provider. We will continue to follow along, please call if questions/concerns.    Total Time Spent 45 minutes: >50% of time was spent in counseling and/or coordination of care.     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    Hypothyroidism E03.9    Pancreatic cancer C25.9    Anemia D64.9     hx of Pulmonary embolism I26.99    Depression F32.9    Sepsis A41.9       No Known Allergies (drug, envir, food or latex)    Scheduled Meds:    [START ON 05/14/2017] ascorbic acid  500 mg Oral Daily with breakfast    ferrous sulfate  300 mg Oral Daily    daptomycin IV  8 mg/kg Intravenous Q24H    senna  2 tablet Oral 2 times per day    bisacodyl  10 mg Rectal Daily    enoxaparin  40  mg Subcutaneous Daily    methylphenidate  5 mg Oral BID    insulin glargine  12 Units Subcutaneous Nightly    DULoxetine  20 mg Oral Daily    ertapenem  1,000 mg Intravenous Q24H    metoclopramide  10 mg Oral 4x Daily AC & HS    lidocaine  1 patch Transdermal Q24H    methylnaltrexone  12 mg Subcutaneous Every Other Day    docusate sodium  200 mg Oral 2 times per day    polyethylene glycol  17 g Oral 2 times per day    pantoprazole  40 mg Oral QAM    caspofungin  50 mg Intravenous Q24H    acetaminophen  1,000 mg Oral Q8H    methadone  5 mg Oral Q8H    levothyroxine  137 mcg Oral Daily    amitriptyline  25 mg Oral Nightly       Continuous Infusions:    TPN (2 in 1) Adult Cyclical      And    fat emulsion soybean oil      TPN (2 in 1) Adult Cyclical Stopped (36/62/94 7654)    sodium chloride Stopped (05/07/17 0708)       PRN Meds:  HYDROmorphone PF **OR** HYDROmorphone PF, LORazepam, heparin lock flush, ondansetron

## 2017-05-13 NOTE — Progress Notes (Signed)
ID Progress Note    Interval:  None acute  Afebrile, HDS      Subjective:    Patient reports not too bad, minimal abdominal pain. Denied f/c. Denied SOB. Appetite is poor to OK.     Objective:    Temp:  [35.8 C (96.4 F)-36.5 C (97.7 F)] 36.4 C (97.5 F)  Heart Rate:  [95-107] 103  Resp:  [12-20] 20  BP: (98-150)/(60-90) 118/62    PHYSICAL EXAM  No major change since prior  General: Sitting in chair, looked more comfortable than prior days and also smiled today, cooperative  Lines: Left IJ CDI, Right chest Mediport de-accessed, PIVs  HEENT: Atraumatic head, EOMI  Cardiovascular: Regular rate and rhythm, no murmurs  Lungs: Breathing well on room air  Abdomen: Soft, tender, 3 left-sided drains in left abdominal area with  purulent drainage, and prior hepatic duct drain on right side  Neurologic: Tired  Extremities: No cyanosis or edema bilaterally    Labs:      Recent Labs  Lab 05/13/17  0104 05/12/17  0152 05/11/17  0045 05/10/17  0042 05/09/17  0100 05/08/17  0005   WBC 10.8* 11.3* 11.2* 11.1* 11.7* 12.8*   Hemoglobin 7.7* 7.8* 7.9* 7.9* 7.6* 7.9*   Hematocrit 24* 24* 25* 25* 25* 25*   Platelets 504* 524* 513* 517* 481* 490*   Seg Neut % 75.9 75.0 86.0 75.3 74.8 81.9   Bands %  --   --   --  _0 Recent Labs  Lab 05/13/17  0104 05/12/17  0152 05/11/17  0045   Sodium 133 132* 137   Potassium 4.4 4.2 4.3   Chloride 98 97 102   CO2 _1 UN 22* 23* 26*   Creatinine 0.58 0.56 0.56   Glucose 246* 122* 127*   Calcium 8.7 8.5* 8.5*       Recent Labs  Lab 05/13/17  0104 05/12/17  0152 05/11/17  0045   Albumin 2.5* 2.3* 2.5*   Total Protein 6.8 6.6 6.9   ALT 54* 49* 48*   AST 52* 45* 52*   Alk Phos 437* 386* 378*     No results for input(s): INR in the last 72 hours.    No results for input(s): ESR, CRP in the last 168 hours.   No results for input(s): [place], [place], LAC in the last 168 hours.     Imaging:  No results found.        Microbiology:  1/25: Intraoperative bile : NTD    2/12: 1 set of blood  cultures from the right Mediport were sent (no peripheral were sent): positive for Enterobacter cloacae complex in 70 hrs, sensitive to zosyn.     2/18 abdominal GS had >25 PMNs, many GPC in pairs/chains, many GNB and cultures 4+ Enterobacter cloacae complex. The cultures also grew 1+ Candida glabrata     2/21: BCx from the Right peripheral IV, mediport, L IJ are NTD    05/03/17: IR-guided I&D with drain placement of the left upper quadrant collection: GS > 25 PMNs, many GNB and GN diplococci, Cultures with 4+ Enterobacter cloacae complex (resistant to Zosyn), C. Albicans and C. Glabrata, and Candida tropicalis    05/04/17:    1 set of blood cultures from left IJ: VRE at 15.3 TTP. Daptomycin sensitivities: 25mg/ml (dose dependent)   1 set from periphery: VRE and Enterobacter cloacae at 14.8 TTP  05/05/17:  IR-guided I&D with drain placement of the intrahepatic collection: GS 1-10 PMNs, very few GPC in pairs. Fungal cultures with Candida glabrata, other Cultures pending.     05/05/17: Blood cultures from periphery, mediport and IJ: NTD    05/04/17: Urine cultures obtained (for unclear reason): NTD    05/08/17: Abscess GS: >25 PMNs, many GPC in chains, few GNB. Aerobic cultures growing Enterobacter (ertapenem -sensitive) and VRE again. Fungal cultures C. glabrata    Assessment:    - Multiple intraabdominal abscesses with Enterobacter cloacae complex bacteremia and fungi, now with drain placement in hopes of source control.   - VRE and Enterobacter cloacae bacteremia due to the intra-abdominal source  - Patient at high risk for Fungemia being on TPN  - Left sided pleural effusion  - Pancreatic cancer s/p recent Whipple's on 03/29/17  - Recent cholangitis due to hematoma, with right hepatic duct drain placement      RECOMMENDATIONS:  - Continue ertapenem, daptomycin, caspofungin - recommend 2 weeks from 05/08/17 (end date 05/22/17). Please obtain repeat CT A/P with IV contrast at the end of the Abx course to assess the size of  the abscesses to decide further course of management.         ID will follow     Patient seen and examined with ID attending, Dr Marjory Lies        Page with Qs.      Rosina Lowenstein, MD  Infectious Diseases fellow, PGY-4  Pager [phone]

## 2017-05-13 NOTE — Plan of Care (Signed)
Bowel Elimination     Elimination patterns are normal or improving Maintaining        Fluid and Electrolyte Imbalance     Fluid and Electrolyte imbalance Maintaining        GI Bleeding Elimination     Elimination of patterns are normal or improving Maintaining        Mobility     Functional status is maintained or improved - Geriatric Maintaining     Patient's functional status is maintained or improved Maintaining        Nutrition     Patient's nutritional status is maintained or improved Maintaining     Nutritional status is maintained or improved - Geriatric Maintaining        Pain/Comfort     Patient's pain or discomfort is manageable Maintaining        Post-Operative Bowel Elimination     Elimination pattern is normal or improving Maintaining        Post-Operative Complications     Patient will remain free from symptoms of infection-post op Maintaining     Prevent post-operative complications Maintaining        Post-Operative Hemodynamic Stability     Maintain Hemodynamic Stability Maintaining        Psychosocial     Demonstrates ability to cope with illness Maintaining        Safety     Patient will remain free of falls Maintaining

## 2017-05-13 NOTE — Progress Notes (Signed)
05/13/17 0800   [facility] Patient Class Review   Patient Class Review Inpatient   Patient Class Effective 03/29/2017  Earnest Conroy RN   Utilization  Management  X [phone]  Page [phone]

## 2017-05-13 NOTE — Consults (Signed)
Medical Nutrition Therapy Brief Note:      Calorie Counts:  Date Energy (kcal) Protein (g) Comments   3/7 137 0    3/8 276 4    3/9 447 8    Average 287 4      Estimated Nutrient Needs:   (Based on admit wt 72 kg)                    1800-2160 kcal/day (25-30 kcal/kg)              86-108 g protein/day (1.2-1.5 g/kg)               1800-2169 mL fluid/day (25-30 mL/kg)    Per review of calorie counts, pt is only meeting 16% of calorie needs and 5% of protein needs. Would recommend feeding tube to meet caloric needs. Pt may benefit from post-pyloric feeds.    Recommend:     1. Once access is obtained a patient is medically cleared for enteral feeds, recommend Osmolite 1.0 @ 65 mL/hr increase by 10 mL q 4 hrs (or as tolerated) to goal of 65 mL/hr x 24 hours w/ a minimum 30 mL H2O flush q 4 hrs. Add 30 ml prosource BID. Adjust free H2O flushes prn. Goal TF regimen will provide 1774 kcal/d, 99 g protein/d, and 1490 mL free H2O/d.  2. Chewable multivitamin daily; RN to crush and administer via feeding tube, flush w/ 30 mL H2O after administration.  3. Continue to monitor BMP, Mg and PO4. Replete as needed.     Laurence Aly, [place]  Pager [phone]

## 2017-05-14 ENCOUNTER — Inpatient Hospital Stay: Payer: Medicare (Managed Care)

## 2017-05-14 DIAGNOSIS — E46 Unspecified protein-calorie malnutrition: Secondary | ICD-10-CM

## 2017-05-14 DIAGNOSIS — R600 Localized edema: Secondary | ICD-10-CM

## 2017-05-14 DIAGNOSIS — K30 Functional dyspepsia: Secondary | ICD-10-CM

## 2017-05-14 LAB — COMPREHENSIVE METABOLIC PANEL
ALT: 55 U/L — ABNORMAL HIGH (ref 0–35)
AST: 51 U/L — ABNORMAL HIGH (ref 0–35)
Albumin: 2.4 g/dL — ABNORMAL LOW (ref 3.5–5.2)
Alk Phos: 471 U/L — ABNORMAL HIGH (ref 35–105)
Anion Gap: 13 (ref 7–16)
Bilirubin,Total: <0.2 mg/dL (ref 0.0–1.2)
CO2: 24 mmol/L (ref 20–28)
Calcium: 8.5 mg/dL — ABNORMAL LOW (ref 8.6–10.2)
Chloride: 98 mmol/L (ref 96–108)
Creatinine: 0.59 mg/dL (ref 0.51–0.95)
GFR,Black: 107 *
GFR,Caucasian: 93 *
Glucose: 140 mg/dL — ABNORMAL HIGH (ref 60–99)
Lab: 23 mg/dL — ABNORMAL HIGH (ref 6–20)
Potassium: 4 mmol/L (ref 3.3–5.1)
Sodium: 135 mmol/L (ref 133–145)
Total Protein: 6.8 g/dL (ref 6.3–7.7)

## 2017-05-14 LAB — CBC AND DIFFERENTIAL
Baso # K/uL: 0.1 10*3/uL (ref 0.0–0.1)
Basophil %: 0.9 %
Eos # K/uL: 0.2 10*3/uL (ref 0.0–0.4)
Eosinophil %: 1.7 %
Hematocrit: 24 % — ABNORMAL LOW (ref 34–45)
Hemoglobin: 7.3 g/dL — ABNORMAL LOW (ref 11.2–15.7)
IMM Granulocytes #: 0.2 10*3/uL
IMM Granulocytes: 1.6 %
Lymph # K/uL: 1.2 10*3/uL (ref 1.2–3.7)
Lymphocyte %: 10.9 %
MCH: 28 pg/cell (ref 26–32)
MCHC: 31 g/dL — ABNORMAL LOW (ref 32–36)
MCV: 92 fL (ref 79–95)
Mono # K/uL: 0.8 10*3/uL (ref 0.2–0.9)
Monocyte %: 7 %
Neut # K/uL: 8.8 10*3/uL — ABNORMAL HIGH (ref 1.6–6.1)
Nucl RBC # K/uL: 0 10*3/uL (ref 0.0–0.0)
Nucl RBC %: 0 /100 WBC (ref 0.0–0.2)
Platelets: 493 10*3/uL — ABNORMAL HIGH (ref 160–370)
RBC: 2.6 MIL/uL — ABNORMAL LOW (ref 3.9–5.2)
RDW: 17.9 % — ABNORMAL HIGH (ref 11.7–14.4)
Seg Neut %: 77.9 %
WBC: 11.2 10*3/uL — ABNORMAL HIGH (ref 4.0–10.0)

## 2017-05-14 LAB — PROTIME-INR
INR: 1.4 — ABNORMAL HIGH (ref 0.9–1.1)
Protime: 15.3 s — ABNORMAL HIGH (ref 10.0–12.9)

## 2017-05-14 LAB — AFB CULTURE: AFB Culture: 0

## 2017-05-14 LAB — MAGNESIUM: Magnesium: 1.7 mg/dL (ref 1.6–2.5)

## 2017-05-14 LAB — POCT GLUCOSE
Glucose POCT: 166 mg/dL — ABNORMAL HIGH (ref 60–99)
Glucose POCT: 96 mg/dL (ref 60–99)
Glucose POCT: 153 mg/dL — ABNORMAL HIGH (ref 60–99)
Glucose POCT: 118 mg/dL — ABNORMAL HIGH (ref 60–99)

## 2017-05-14 LAB — TRIGLYCERIDES: Triglycerides: 134 mg/dL

## 2017-05-14 LAB — PHOSPHORUS: Phosphorus: 4.2 mg/dL (ref 2.7–4.5)

## 2017-05-14 MED ORDER — MAGNESIUM SULFATE 2 GM IN 50 ML *WRAPPED*
2000.0000 mg | Freq: Once | INTRAVENOUS | Status: AC
Start: 2017-05-14 — End: 2017-05-14
  Administered 2017-05-14: 2000 mg via INTRAVENOUS
  Filled 2017-05-14: qty 50

## 2017-05-14 MED ORDER — FAT EMULSION SOYBEAN OIL 20 % IV EMUL *WRAPPED*
60.0000 g | Freq: Every day | INTRAVENOUS | Status: AC
Start: 2017-05-14 — End: 2017-05-15
  Administered 2017-05-14: 60 g via INTRAVENOUS
  Filled 2017-05-14: qty 300

## 2017-05-14 MED ORDER — IOHEXOL 350 MG/ML (OMNIPAQUE) IV SOLN *I*
1.0000 mL | Freq: Once | INTRAVENOUS | Status: AC
Start: 2017-05-14 — End: 2017-05-14
  Administered 2017-05-14: 97 mL via INTRAVENOUS

## 2017-05-14 MED ORDER — STERILE WATER FOR IRRIGATION IR SOLN *I*
900.0000 mL | Freq: Once | Status: AC
Start: 2017-05-14 — End: 2017-05-14
  Administered 2017-05-14: 150 mL via ORAL

## 2017-05-14 MED ORDER — DRONABINOL 2.5 MG PO CAPS *I*
5.0000 mg | ORAL_CAPSULE | Freq: Two times a day (BID) | ORAL | Status: DC
Start: 2017-05-14 — End: 2017-05-15
  Administered 2017-05-14 (×2): 5 mg via ORAL
  Filled 2017-05-14 (×4): qty 2

## 2017-05-14 MED ORDER — SODIUM CHLORIDE 0.9 % IV SOLN WRAPPED *I*
8.0000 mg/kg | INTRAVENOUS | Status: DC
Start: 2017-05-15 — End: 2017-05-27
  Administered 2017-05-15 – 2017-05-27 (×13): 500 mg via INTRAVENOUS
  Filled 2017-05-14 (×15): qty 10

## 2017-05-14 MED ORDER — STERILE WATER FOR INJECTION (TPN USE ONLY) *I*
INTRAVENOUS | Status: AC
Start: 2017-05-14 — End: 2017-05-15
  Filled 2017-05-14: qty 717.6

## 2017-05-14 MED ORDER — PROSOURCE NO CARB PO LIQD *I*
30.0000 mL | Freq: Two times a day (BID) | ORAL | Status: DC
Start: 2017-05-14 — End: 2017-06-03
  Administered 2017-05-15 – 2017-06-03 (×31): 30 mL via ORAL

## 2017-05-14 MED ORDER — CASPOFUNGIN 50 MG IN 110 ML NS MINI-BAG (0.5 MG/ML) *I*
50.0000 mg | INTRAVENOUS | Status: DC
Start: 2017-05-14 — End: 2017-05-20
  Administered 2017-05-14 – 2017-05-19 (×6): 50 mg via INTRAVENOUS
  Filled 2017-05-14 (×7): qty 100

## 2017-05-14 MED ORDER — ERTAPENEM IN NS 1 GM *I*
1000.0000 mg | INTRAVENOUS | Status: DC
Start: 2017-05-14 — End: 2017-05-30
  Administered 2017-05-14 – 2017-05-29 (×16): 1000 mg via INTRAVENOUS
  Filled 2017-05-14 (×17): qty 50

## 2017-05-14 NOTE — Progress Notes (Addendum)
Dobhoff tube placed without complication. Patient was instructed to tuck her chin to her chest and take small sips of water to assist placement of tube into the GI tract rather than the respiratory tract. She did not cough, and had minimal gagging, no emesis.     DHT secured at 65cm at the nare. KUB with gastric placement, but more istal placement desired so DHT advanced to 72 at the nare. Plan for repeat KUB, and if DHT is in appropriate position, will start tube feeds. Since nursing informs [name] that Osmolite is not available, will start Jevity 1.5 instead.    Cristie Hem, MD 05/14/17 5:15PM    Addendum:  - Repeat KUB demonstrates more distal placement  - Tube feeds started    Cristie Hem, MD 05/14/17 6:58 PM

## 2017-05-14 NOTE — Progress Notes (Signed)
Palliative Care Progress Note    HPI: [name] w/ PMH fibromyalgia, pancreatic CA s/p Whipple for 'cure' 03/29/17; s/p ICU w/ intub for resp failure & septic shock, weaned from pressors and extubated to HFNC 2/17 w/ IR drainage bili fluid collection 2/19, ID following & receiving IV abx (caspo ends 3/2). Palliative following for symptoms, started methadone 2/19, weaned dose 2/23 d/t concern for sedation. Recent bowel clean out s/p enema & methylnaltrexone. Unfortunately course complicated by intraabdominal abscesses, s/p IR for placement of perc drains & evacuation of pus, ID following; pt remains on cyclic TPN w/ poor po intake. Trial ritalin 1x/d on 3/7 w/ fair effect, much better result noted 3/8 w/ 2x/d dosing. Pt w/ ongoing poor po intake, nutrition rec fdg tube.    Subjective: "I feel exhausted. I suppose it would be worth a try to do the feeding tube, I'm trying my best to drink and eat some fruit. I just don't feel like eating much"    Objective: chart reviewed, methadone dose increased yesterday, started marinol today for appetite stim; perc drain removed today; pt reporting good pain control, mild confusion and smiling often maybe r/t marinol; joint visit w/ Dr Ron Agee reiterated pt's progress, need to increase nutritional intake, reviewed need for PT at [facility] but not able to go on TPN and if not taking adeq PO intake, may likely need PEG; reviewed trial nasal dubhoff fdg tube w/ trickle feeds & hope that as pt able to get some better nutrition then she will hopefully regain appetite; pt agreeable to having fdg tube placed this eve for trickle TFs; enc pt to ask for pain meds prn and stay ahead of pain     Palliative Care ROS:  Pain   Moderate  Nausea   Mild  Anorexia   Moderate  Anxiety   Mild  Depression   Mild  Shortness of Breath   Mild  Tiredness/Fatigue   Mild  Drowsiness/Sleepiness   Mild  Airway Secretions   no  Constipation   no  Unable to Respond   no  Delirium   no   Last stool 3/12    Physical  Examination:   BP: (102-120)/(50-80)   Temp:  [35.8 C (96.4 F)-36.5 C (97.7 F)]   Temp src: Temporal (03/12 1130)  Heart Rate:  [97-109]   Resp:  [16-20]   SpO2:  [91 %-95 %]   Height:  [170.2 cm (5' 7.01")]   Weight:  [64.9 kg (143 lb)-64.9 kg (143 lb 1.3 oz)]   Gen appearance: [name], sitting up in bed, reports intermittent mod LLQ abd pain esp around times eating/drinking  Lungs: easy resp, RA;  enc to use IS  Heart: reg  Abd: large, distended, R side perc drain x 2 and L Bili tube, stool today  Extrem: mild LE edema  Skin: pale, warm, dry  Neuro: pt more awake and interactive today, A+O x 3, at baseline ambulates short distances w/ walker, OOB to chair stand & pivot w/ walker    Assessment/Plan: [name] w/ pancreatic CA, s/p Whipple in Jan. remains on TPN, now receiving IV abx s/p IR for R side perc drain x 3 and L side bili; continues on every other day methylnaltrexone w/ + results passing stool and gas. Pt has used total 5 mg IV dilaudid prn over last 24 hrs, reporting better pain control. Pt in agreement w/ dubhoff fdg tube placement for trickle TFs as she has not significantly improved  her appetite over last few weeks and is in line dispo plan now for SNF Rehab.    Pain/dyspnea  Acetaminophen 1 gm po TID  Methadone 5/7.5/7.5 mg po TID -total 20 mg/d (3/1 ECG w/ QTc 430)  Dilaudid 0.5 mg IV Q 1 hr prn (last x 2 on 3/11)    Dilaudid 1 mg IV Q 1 hr prn (x 5 on 3/11, x 3 on 3/12)  Lidocaine patch daily  Encourage incentive spirometry prn     Anxiety/agitation/nausea/fatigue  Amitriptyline 25 mg po QHS  Duloxetine 20 mg po QHS (started 3/4, switched from lexapro)  Reglan 10 mg po 4x/d (AC & QHS)  Pantoprazole 40 mg po daily  Lorazepam 0.5 mg po Q 6 hrs prn (last x 1 on 3/7 QHS)  Ondansetron 4 mg IV Q 6 hrs prn (last x 1 on 3/10)  Ritalin 5 mg po 2x/d at 8 am and 12 pm (increased 3/8 w/ +effect, continue for now)  Dronabinol 5 mg po BID (started 3/12)    Prevention of constipation, last  stool 3/12  Colace 200 mg po BID  Miralax 17 gm po BID  Senna 2 tab po BID  Bisacodyl supp pr daily (started 3/8 and hasn't received since)  Methylnaltrexone 12 mg SC every other day (started 2/26)     GOC/prognosis  Full code  Dtr Rod Mae is HCP  Dispo plans now for SNF Rehab (pt agreeable to dubhoff fdg tube placement today, ideally she may feel better w/ some nutrition and regain her own appetite or may need PEG before dispo)    Pt case discussed w/ Dr Ron Agee, RN and Jaclyn Shaggy covering provider. We will continue to follow along, please call if questions/concerns.    Total Time Spent 40 minutes: >50% of time was spent in counseling and/or coordination of care.     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    Hypothyroidism E03.9    Pancreatic cancer C25.9    Anemia D64.9     hx of Pulmonary embolism I26.99    Depression F32.9    Sepsis A41.9       No Known Allergies (drug, envir, food or latex)    Scheduled Meds:    dronabinol  5 mg Oral 2 times per day    ascorbic acid  500 mg Oral Daily with breakfast    ferrous sulfate  300 mg Oral Daily    methadone  5 mg Oral Daily    And    methadone  7.5 mg Oral BID    daptomycin IV  8 mg/kg Intravenous Q24H    senna  2 tablet Oral 2 times per day    bisacodyl  10 mg Rectal Daily    enoxaparin  40 mg Subcutaneous Daily    methylphenidate  5 mg Oral BID    insulin glargine  12 Units Subcutaneous Nightly    DULoxetine  20 mg Oral Daily    ertapenem  1,000 mg Intravenous Q24H    metoclopramide  10 mg Oral 4x Daily AC & HS    lidocaine  1 patch Transdermal Q24H    methylnaltrexone  12 mg Subcutaneous Every Other Day    docusate sodium  200 mg Oral 2 times per  day    polyethylene glycol   17 g Oral 2 times per day    pantoprazole  40 mg Oral QAM    caspofungin  50 mg Intravenous Q24H    acetaminophen  1,000 mg Oral Q8H    levothyroxine  137 mcg Oral Daily    amitriptyline  25 mg Oral Nightly       Continuous Infusions:    TPN (2 in 1) Adult Cyclical      And    fat emulsion soybean oil      TPN (2 in 1) Adult Cyclical Stopped (56/31/49 0615)    sodium chloride Stopped (05/07/17 0708)       PRN Meds:  calcium carbonate, HYDROmorphone PF **OR** HYDROmorphone PF, LORazepam, heparin lock flush, ondansetron

## 2017-05-14 NOTE — Progress Notes (Addendum)
Surgical Oncology/Hepatobiliary Surgery Progress Note     LOS: 46 days     Subjective:    PO intake up and down  Afebrile  WBC stable  Mental status stable  Denies N/V, SOB, CP     Objective:    Vitals Sign Ranges for Past 24 Hours:  BP: (102-120)/(50-80)   Temp:  [35.8 °C (96.4 °F)-36.5 °C (97.7 °F)]   Temp src: Temporal (03/12 0815)  Heart Rate:  [97-109]   Resp:  [16-20]   SpO2:  [92 %-95 %]   Weight:  [64.9 kg (143 lb 1.3 oz)]       Physical Exam:    General Appearance: in NAD, speaking slowly/quietly  Cardiac: regular rate  Respiratory: non-labored breathing on room air  Abdomen: soft, moderately tender to palpation, distended, pre-existing ecchymosis, biliary tube clamped, LUQ drain with purulent output. Inferior anterior drain with bloody/purulent output. Superior anterior drain with SS output. Anterior (inferior) midline drain with purulent output.  Extremities: warm    Labs:    CBC:    Recent Labs  Lab 05/14/17  0035 05/13/17  0104 05/12/17  0152 05/11/17  0045 05/10/17  0042 05/09/17  0100   WBC 11.2* 10.8* 11.3* 11.2* 11.1* 11.7*   Hemoglobin 7.3* 7.7* 7.8* 7.9* 7.9* 7.6*   Hematocrit 24* 24* 24* 25* 25* 25*   Platelets 493* 504* 524* 513* 517* 481*       Metabolic Panel:    Recent Labs  Lab 05/14/17  0035 05/13/17  0104 05/12/17  0152 05/11/17  0045 05/10/17  0042 05/09/17  0100   Sodium 135 133 132* 137 130* 134   Potassium 4.0 4.4 4.2 4.3 4.4 4.2   Chloride 98 98 97 102 96 99   CO2 24 23 23 24 23 21   UN 23* 22* 23* 26* 20 22*   Creatinine 0.59 0.58 0.56 0.56 0.60 0.55   Glucose 140* 246* 122* 127* 195* 108*   Calcium 8.5* 8.7 8.5* 8.5* 8.3* 8.4*   Magnesium 1.7 1.6 1.6 1.9 1.9 1.7   Phosphorus 4.2 4.7* 4.1 4.6* 4.2 3.8          Assessment:    72 y.o. female with h/o recent PE and pancreatic cancer POD # 46  status post whipple procedure complicated by delayed gastric emptying.  NGT replaced on 04/04/17. UGI on 04/12/17 concerning for obstruction just beyond the GJ in the efferent limb.  Course  complicated on 04/16/17 by rising LFTs and sepsis with CT concerning for cholangitis given biliary tree obstruction and PV compression due to hematoma and afferent limb distention in setting of PE treatment. Is s/p IR PTC on 04/16/17 and IR percutaneous aspiration of anterior abdominal fluid collection on 04/24/17. IR LUQ perc drain placement 3/1. IR anterior abdominal 8fr perc drain placement 3/3. IR anterior perc drain into left hepatic collection 3/6.    Plan:  - Regular diet + ensures  - CT scan today  - Cycle TPN, continue to encourage PO/enteral nutrition  - Aggressive bowel regimen including methylnaltrexone  - Continue ertapenem/caspo/dapto, f/u additional culture results, appreciate ID recs  - Continue capped biliary drain  - SCDs, Lovenox for VTE ppx (hold therapeutic anticoagulation)   - Dispo: pending clinical course, IR drains    Krishna Patel, MD  05/14/2017     9:08 AM   General Surgery Resident    HPB-GI Attending Addendum:   I personally examined the patient, reviewed the notes, and discussed the plan of care   with the residents and patient. Agree with detailed resident's note. Please see the note above for details of history, exam, labs, assessment/plan which reflect my input.         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 Abdominal abscess on imaging. Drains placed with purulent drainage. SIRS without sepsis. Improving.      Anorexia. Encouraging oral intake. Will attempt Ensure TID.   Hyperglycemia with insulin sliding scale and recurrent need for insulin. continue   Post operative anemia. No evidence of bleeding.   Bilateral pleural effusions with lung compression. Left greater than right. Monitor for now.  Pulmonary embolism. Present on admission. No evidence of worsening.  Continue to hold anticoagulation.  Hypoalbuminemia. Moderate malnutrition. TPN. Encourage oral intake. Reglan.  Nutritional supplementation.  Depression. Encouragement provided. Trial of new anti-depressant therapy.  Counseling. Daily encouragement.  Leukocytosis. VRE. Antibiotics transitioned due to resistant organisms. Stable.   Multiple intraabdominal abscesses with Enterobacter cloacae complex bacteremia and fungus.   Drains placed with purulent output. Irrigate TID daily  VRE and Enterobacter cloacae bacteremia due to the intra-abdominal source. Treating with antibiotics.   Imaging shows improvement of the    Constipation. Continue regimen.      Will repeat CT scan tomorrow.       , MD, FACS  Hepatobiliary, Pancreas & GI Surgery

## 2017-05-14 NOTE — Plan of Care (Signed)
Bowel Elimination     Elimination patterns are normal or improving Maintaining        Fluid and Electrolyte Imbalance     Fluid and Electrolyte imbalance Maintaining        GI Bleeding Elimination     Elimination of patterns are normal or improving Maintaining        Mobility     Functional status is maintained or improved - Geriatric Maintaining     Patient's functional status is maintained or improved Maintaining        Nutrition     Patient's nutritional status is maintained or improved Maintaining     Nutritional status is maintained or improved - Geriatric Maintaining        Pain/Comfort     Patient's pain or discomfort is manageable Maintaining        Post-Operative Bowel Elimination     Elimination pattern is normal or improving Maintaining        Post-Operative Complications     Patient will remain free from symptoms of infection-post op Maintaining     Prevent post-operative complications Maintaining        Post-Operative Hemodynamic Stability     Maintain Hemodynamic Stability Maintaining        Psychosocial     Demonstrates ability to cope with illness Maintaining        Safety     Patient will remain free of falls Maintaining

## 2017-05-14 NOTE — Plan of Care (Signed)
Multiple and extensive Intra-abdominal abscesses.     -Cultures growing VRE, Enterobacter cloacae, and C. glabrata. Source control recently achieved with drain placement  -Continue Daptomycin 8mg /kg, Ertapenem and caspo  -Will f/u C glabrata sensitivities but anticipate 3 weeks of therapy (05/08/17 to 05/29/17)  from drain placement with repeat imaging (CT A/P with IV contrast) at the end of the course, to determine if further therapy warranted.       ID will now sign-off, recall if needed.     Discussed with Dr. Marjory Lies.         Rosina Lowenstein, MD  Infectious Diseases fellow, PGY-4  Pager [phone]

## 2017-05-14 NOTE — Consults (Signed)
Medical Nutrition Therapy Brief Note: TPN check    Patient remains on TPN for nutrition support. Chart, labs, medications / TPN order, I/Os reviewed. Na 135 (WNL), K+ 4.0 (WNL), Cl 98 (WNL), CO2 24 (WNL), Ca 9.78 (WNL corrected for low albumin 2.4), PO4 4.2 (WNL), Mg 1.7 (WNL). Recent t bili <0.2 (WNL) from 3/12. Recent TGs 134 (WNL) from 3/12.  BGs ranging from 96 - 166 over the past 24 hours.  MAR reviewed with repletions noted for Mg sulfate. Pt started on marinol. I/Os significant for 700 ml PO, -110 ml drain output, BM x0. Current TPN order reviewed - no changes to TPN order needed at this time.    Recommendations:  1.  Continue regular diet with supplements as ordered   2.  Continue TPN as ordered: TPN 193mL/hr x 11hrs then taper to 60mL/hr x 1 hr  Amino acids:69g/L  Dextrose: 173g/L   Standard additives except the following:   Na: 119mEq/L  K: 71mEq/L  Ca: 42mEq/L  Mg: 22mEq/L  PO4: 57mmol/L  200 mg levocarnitine   Maximize Chloride  Insulin: 0units/LITER  Fat Emulsion 20% infusion: 60gm/day (to be infused over 12 hrs)  -TPN provides: 1948kcal/day, 108g protein/day, 270g dextrose/day and 1825mL fluid/day (including amino acid/dextrose solution and lipid volume).  3.   Continue TPN lab and BG monitoring parameters  4.   Once access is obtained a patient is medically cleared for enteral feeds, recommend Osmolite 1.0 @ 65 mL/hr increase by 10 mL q 4 hrs (or as tolerated) to goal of 65 mL/hr x 24 hours w/ a minimum 30 mL H2O flush q 4 hrs. Add 30 ml prosource BID. Adjust free H2O flushes prn. Goal TF regimen will provide 1774 kcal/d, 99 g protein/d, and 1490 mL free  H2O/d.      Will continue to monitor.     Laurence Aly, [place]  Pager [phone]

## 2017-05-14 NOTE — Progress Notes (Signed)
Perc Drain removal    After cutting the skin suture, the midline abdominal size 8 Fr percutaneous drain internal string was released by cutting the tubing, and then slowly removed. The perc drain was removed easily, without significant resistance. The drain site was then dressed with a 2x2 and tegaderm. The patient tolerated the procedure well.    Will continue to monitor output LUQ and midline perc drain #3.    Severiano Gilbert, NP   1:47 PM

## 2017-05-15 DIAGNOSIS — A498 Other bacterial infections of unspecified site: Secondary | ICD-10-CM

## 2017-05-15 DIAGNOSIS — B379 Candidiasis, unspecified: Secondary | ICD-10-CM

## 2017-05-15 LAB — POCT GLUCOSE
Glucose POCT: 119 mg/dL — ABNORMAL HIGH (ref 60–99)
Glucose POCT: 110 mg/dL — ABNORMAL HIGH (ref 60–99)
Glucose POCT: 159 mg/dL — ABNORMAL HIGH (ref 60–99)
Glucose POCT: 124 mg/dL — ABNORMAL HIGH (ref 60–99)

## 2017-05-15 LAB — COMPREHENSIVE METABOLIC PANEL
ALT: 83 U/L — ABNORMAL HIGH (ref 0–35)
AST: 105 U/L — ABNORMAL HIGH (ref 0–35)
Albumin: 2.4 g/dL — ABNORMAL LOW (ref 3.5–5.2)
Alk Phos: 617 U/L — ABNORMAL HIGH (ref 35–105)
Anion Gap: 12 (ref 7–16)
Bilirubin,Total: 0.2 mg/dL (ref 0.0–1.2)
CO2: 23 mmol/L (ref 20–28)
Calcium: 8.4 mg/dL — ABNORMAL LOW (ref 8.6–10.2)
Chloride: 97 mmol/L (ref 96–108)
Creatinine: 0.55 mg/dL (ref 0.51–0.95)
GFR,Black: 109 *
GFR,Caucasian: 95 *
Glucose: 157 mg/dL — ABNORMAL HIGH (ref 60–99)
Lab: 20 mg/dL (ref 6–20)
Potassium: 4.2 mmol/L (ref 3.3–5.1)
Sodium: 132 mmol/L — ABNORMAL LOW (ref 133–145)
Total Protein: 6.7 g/dL (ref 6.3–7.7)

## 2017-05-15 LAB — CBC AND DIFFERENTIAL
Baso # K/uL: 0.1 10*3/uL (ref 0.0–0.1)
Basophil %: 0.7 %
Eos # K/uL: 0.2 10*3/uL (ref 0.0–0.4)
Eosinophil %: 1.8 %
Hematocrit: 24 % — ABNORMAL LOW (ref 34–45)
Hemoglobin: 7.2 g/dL — ABNORMAL LOW (ref 11.2–15.7)
IMM Granulocytes #: 0.2 10*3/uL
IMM Granulocytes: 1.3 %
Lymph # K/uL: 1.1 10*3/uL — ABNORMAL LOW (ref 1.2–3.7)
Lymphocyte %: 9.5 %
MCH: 28 pg/cell (ref 26–32)
MCHC: 30 g/dL — ABNORMAL LOW (ref 32–36)
MCV: 93 fL (ref 79–95)
Mono # K/uL: 0.8 10*3/uL (ref 0.2–0.9)
Monocyte %: 7.2 %
Neut # K/uL: 8.9 10*3/uL — ABNORMAL HIGH (ref 1.6–6.1)
Nucl RBC # K/uL: 0 10*3/uL (ref 0.0–0.0)
Nucl RBC %: 0 /100 WBC (ref 0.0–0.2)
Platelets: 502 10*3/uL — ABNORMAL HIGH (ref 160–370)
RBC: 2.6 MIL/uL — ABNORMAL LOW (ref 3.9–5.2)
RDW: 18 % — ABNORMAL HIGH (ref 11.7–14.4)
Seg Neut %: 79.5 %
WBC: 11.2 10*3/uL — ABNORMAL HIGH (ref 4.0–10.0)

## 2017-05-15 LAB — FUNGUS CULTURE

## 2017-05-15 LAB — MAGNESIUM: Magnesium: 1.7 mg/dL (ref 1.6–2.5)

## 2017-05-15 LAB — PHOSPHORUS: Phosphorus: 4.1 mg/dL (ref 2.7–4.5)

## 2017-05-15 LAB — CK: CK: 49 U/L (ref 26–192)

## 2017-05-15 MED ORDER — MAGNESIUM SULFATE 2 GM IN 50 ML *WRAPPED*
2000.0000 mg | Freq: Once | INTRAVENOUS | Status: AC
Start: 2017-05-15 — End: 2017-05-15
  Administered 2017-05-15: 2000 mg via INTRAVENOUS
  Filled 2017-05-15: qty 50

## 2017-05-15 MED ORDER — AMITRIPTYLINE HCL 25 MG PO TABS *I*
37.5000 mg | ORAL_TABLET | Freq: Every evening | ORAL | Status: DC
Start: 2017-05-15 — End: 2017-05-15

## 2017-05-15 MED ORDER — MIRTAZAPINE 15 MG PO TABS *I*
7.5000 mg | ORAL_TABLET | Freq: Every evening | ORAL | Status: DC
Start: 2017-05-15 — End: 2017-05-15

## 2017-05-15 MED ORDER — FAT EMULSION SOYBEAN OIL 20 % IV EMUL *WRAPPED*
60.0000 g | Freq: Every day | INTRAVENOUS | Status: AC
Start: 2017-05-15 — End: 2017-05-16
  Administered 2017-05-15: 60 g via INTRAVENOUS
  Filled 2017-05-15: qty 300

## 2017-05-15 MED ORDER — AMITRIPTYLINE HCL 25 MG PO TABS *I*
25.0000 mg | ORAL_TABLET | Freq: Every evening | ORAL | Status: DC
Start: 2017-05-15 — End: 2017-05-28
  Administered 2017-05-15 – 2017-05-27 (×13): 25 mg via ORAL
  Filled 2017-05-15 (×14): qty 1

## 2017-05-15 MED ORDER — STERILE WATER FOR INJECTION (TPN USE ONLY) *I*
INTRAVENOUS | Status: AC
Start: 2017-05-15 — End: 2017-05-16
  Filled 2017-05-15: qty 717.6

## 2017-05-15 NOTE — Progress Notes (Signed)
Assumed care of pt at 0700 . VSS telemetry remains  NSR.  Pt very sleepy this morning . Arouses to voice but,  went right back to sleep .  Confused unsure  of place, time, and DOB. Morning dose of Methadone and Marinol held .   Pt up more awake later in the morning , . Ptt up to chair after much encouragement from Probation officer . Pt c very large loose watery BM.  Pt has been receiving multiple  bowel medications .   Pt reported abdominal pain was better after moving bowels .  Pt medicated c Dilaudid 0.5 mg iv at 1212 .  Pt back to bed this afternoon , was able to  ambulate from her chair to bed c stand by assist . Pt   was able to stand from sitting position c verbal cues only .  Pt needs to  be encouraged to continue, oob to chair , even   If pt verbalizes ,not being able to do so .  Pt appears to writer to be more capable than pt thinks ,. Responded  To positive reinforcement . Pt continues c very poor appetite .  Tube feeds as ordered , pt tolerating.  Pt was able to take scheduled afternoon dose of methadone c applesauce .  Pt currently resting , call bell in reach,  side  rails up 2/.4.  Marland KitchenReport given  to oncoming shift .

## 2017-05-15 NOTE — Continuity of Care (Signed)
INFECTIOUS DISEASES CONTINUITY OF CARE FOR TEAM 3 HANDOFF    **This is an interim continuity of care note for purpose of ID team transfer communication, not final recommendations**    Amanda Galloway   [id number]      ID Attending: Dr. Lawana Pai    ID Fellow: Dr. Nancy Nordmann    Primary ID Diagnosis/Diagnoses: Abdominal abscesses    Relevant history, hospital course, ID medical decision making, and future considerations:     Please see the ID consult note from 04/25/17 for full details for the initial consultation.     Briefly, this is a case of abdominal and pelvic abscesses in a [name] patient with pancreatic cancer of the uncinate diagnosed in July 2018 s/p who received 8 cycles of neoadjuvant chemo-radiation, and was admitted on 03/29/17 for planned Roux-en-Y pancreaticoduodenectomy. Post-operative course complicated with GOO and septicshock due to cholangitis due to hematoma in surgical bed that was severely obstructing CBD and portal vein.     Infectious diseases diagnosis:   1)  Multiple intraabdominal abscesses with Enterobacter cloacae complex, Candida glabrata that likely were caused by due to the aforementioned cholangitis. Underwent aspiration of the abscess on 04/23/17, but no drain was left in. Per ID recommendations, she received zosyn and caspofungin for 10 days and repeat CT on 05/03/17 done to assess the abscess sizes, showed  interval increase in a large lobulated midline and left upper abdominal intrahepatic and extrahepatic air and fluid collection which involves the left lobe of the liver and left subdiaphragmatic space, which measures 18.3 x 6.8 cm with connections to the epigastric area midline gas and fluid collection) that was previously sampled on 04/23/17) measuring  7.8 x 3.7cm. She underwent placement of 3 drains, the last being on 05/08/17. Cultures from these abscesses grew Enterobacter cloacae complex (now Zosyn-resistant), VRE, and Candida glabrata. The plan for the abscesses is as  follows:    Daptomycin 8mg /kg, Ertapenem 1gm daily and caspofungin 50mg  daily. Duration: anticipate 3 weeks of therapy (05/08/17 to 05/29/17)  from drain placement with repeat imaging (CT A/P with IV contrast) at the end of the course, to determine if further therapy warranted.     2)  Enterobacter cloacae complex and VRE bacteremia, due to the abscesses   3)  Patient at high risk for Fungemia being on TPN        Lines/Drains/Access: Abdominal abscess drains        Microbiology including dates and culture source:      1/25: Intraoperative bile : NTD    2/12: 1 set of blood cultures from the right Mediport were sent (no peripheral were sent): positive for Enterobacter cloacae complex in 70 hrs, sensitive to zosyn.     2/18 abdominal GS had >25 PMNs, many GPC in pairs/chains, many GNB and cultures 4+ Enterobacter cloacae complex. The cultures also grew 1+ Candida glabrata     2/21: BCx from the Right peripheral IV, mediport, L IJ are NTD    05/03/17: IR-guided I&D with drain placement of the left upper quadrant collection: GS > 25 PMNs, many GNB and GN diplococci, Cultures with 4+ Enterobacter cloacae complex (resistant to Zosyn), C. Albicans and C. Glabrata, and Candida tropicalis    05/04/17:    1 set of blood cultures from left IJ: VRE at 15.3 TTP. Daptomycin sensitivities: 72mcg/ml (dose dependent)   1 set from periphery: VRE and Enterobacter cloacae at 14.8 TTP     05/05/17:  IR-guided I&D with drain placement of the intrahepatic  collection: GS 1-10 PMNs, very few GPC in pairs. Fungal cultures with Candida glabrata, other Cultures pending.     05/05/17: Blood cultures from periphery, mediport and IJ: NTD    05/04/17: Urine cultures obtained (for unclear reason): NTD    05/08/17: Abscess GS: >25 PMNs, many GPC in chains, few GNB. Aerobic cultures growing Enterobacter (ertapenem -sensitive) and VRE again. Fungal cultures C. glabrata        Recommendations including start date & anticipated stop date of antibiotic course:      See above highlighted in blue color    Pending studies related to ID consultation: CT abdomen/pelvis with contrast for assessment of the size of the abscesses to be done on 05/29/17      Anticipated Dispo/Need for OPAT or IDC F/U: Per primary team    Outpatient ID Attending: Dr. Skipper Cliche    Follow-up ID Appointment with Dr. Nancy Nordmann    Can be made once patient ready to be discharged. Appointment to be made with attending physician only.

## 2017-05-15 NOTE — Progress Notes (Addendum)
Infectious Diseases (Team 3) Follow Up Note    Patient transferred to ID Team 3 today, see ID Fellow's Continuity of Care note (pending).    Chart, labs, medications reviewed, patient seen.     CC:  F/U Enterobacter cloacae bacteremia, Intra-abdominal abscesses positive for VRE, enterobacter and candida glabrata      Subjective:  Denies F/C, N/V/D, rashes, SOB or CP.  Appetite poor. C/O abdominal pain, but manageable, and general fatigue/weakness.     ROS: Reviewed 6 systems in detail, see above    Current Meds:  Scheduled Meds:   amitriptyline  25 mg Oral Nightly    PROSOURCE NO CARB  30 mL Oral BID WC    daptomycin IV  8 mg/kg Intravenous Q24H    ertapenem  1,000 mg Intravenous Q24H    caspofungin  50 mg Intravenous Q24H    ascorbic acid  500 mg Oral Daily with breakfast    ferrous sulfate  300 mg Oral Daily    methadone  5 mg Oral Daily    And    methadone  7.5 mg Oral BID    senna  2 tablet Oral 2 times per day    bisacodyl  10 mg Rectal Daily    enoxaparin  40 mg Subcutaneous Daily    methylphenidate  5 mg Oral BID    insulin glargine  12 Units Subcutaneous Nightly    DULoxetine  20 mg Oral Daily    metoclopramide  10 mg Oral 4x Daily AC & HS    lidocaine  1 patch Transdermal Q24H    docusate sodium  200 mg Oral 2 times per day    polyethylene glycol  17 g Oral 2 times per day    pantoprazole  40 mg Oral QAM    acetaminophen  1,000 mg Oral Q8H    levothyroxine  137 mcg Oral Daily     Continuous Infusions:   TPN (2 in 1) Adult Cyclical      And    fat emulsion soybean oil      sodium chloride Stopped (05/07/17 0708)     PRN Meds:.   calcium carbonate  1,000 mg Oral BID PRN    HYDROmorphone PF  0.5 mg Intravenous Q1H PRN    Or    HYDROmorphone PF  1 mg Intravenous Q1H PRN    LORazepam  0.5 mg Oral Q6H PRN    heparin lock flush  50 Units Intracatheter PRN    ondansetron  4 mg Intravenous Q6H PRN       Objective:    BP: (112-120)/(60-70)   Temp:  [36 C (96.8 F)-37.2 C (99 F)]    Temp src: Temporal (03/13 1220)  Heart Rate:  [91-117]   Resp:  [16-18]   SpO2:  [92 %-98 %]     General Appearance:  NAD, sitting in chair smiling during visit, speaking in soft voice.   HEENT: scleral anicteric, MMM, oropharynx clear/no exudates  Pulm: diminished bilaterally (poor inspiratory effort 2/2 abdominal discomfort)  CV: RRR, Normal S1S2, no M/R/G  Abdomen: soft, distended, TTP particularly upper quadrants, active bowel sounds, drains as noted below.   Extremities: MAE   Skin:Warm and dry to touch; no rashes  Neuro: alert and oriented x4, speech fluent, no obvious focal deficits  Lines/Drains/Tubes: LIJ TCVC 2/13 (site benign), right chest wall port (not accessed), abdominal perc drains to gravity drainage bags with small amt purulent material in tubing/bags       Recent Labs  Lab 05/15/17  0014 05/14/17  0035 05/13/17  0104   WBC 11.2* 11.2* 10.8*   Hemoglobin 7.2* 7.3* 7.7*   Hematocrit 24* 24* 24*   Platelets 502* 493* 504*       Recent Labs  Lab 05/15/17  0014 05/14/17  0035 05/13/17  0104   Sodium 132* 135 133   Potassium 4.2 4.0 4.4   CO2 '23 24 23   ' UN 20 23* 22*   Creatinine 0.55 0.59 0.58   Glucose 157* 140* 246*   Calcium 8.4* 8.5* 8.7         Lab results: 05/15/17  0014   Total Protein 6.7   Albumin 2.4*   ALT 83*   AST 105*   Alk Phos 617*   Bilirubin,Total 0.2         CRP:No results found for: CRP  ESR: No results found for: ESR    Micro:   1/25: Intraoperative bile: GS 0 pmns, no organisms, Cx: no growth    2/12: 1 set of blood cultures from the right Mediport (no peripheral bld wase sent): positive for Enterobacter cloacae complex in 70 hrs, sensitive to zosyn.     2/18 abdominal GS had >25 PMNs, many GPC in pairs/chains, many GNB and cultures 4+ Enterobacter cloacae complex. The cultures also grew 1+ Candida glabrata     2/21: BCx from the Right peripheral IV, mediport, L IJ:  No growth    05/03/17: IR-guided I&D with drain placement of the left upper quadrant collection: GS > 25 PMNs,  many GNB and GN diplococci, Cultures with 4+ Enterobacter cloacae complex (resistant to Zosyn and CTX), C. Albicans and C. Glabrata, and Candida tropicalis    05/04/17:    1 set of blood cultures from left IJ: VRE at 15.3 TTP. Daptomycin sensitivities: MIC 73mg/ml (dose dependent)   1 set from periphery: VRE and Enterobacter cloacae at 14.8 TTP     05/05/17:  IR-guided I&D with drain placement of the intrahepatic collection: GS 1-10 PMNs, very few GPC in pairs. Fungal cultures with Candida glabrata    05/05/17: Blood cultures from periphery, mediport and IJ: no growth    05/04/17: Urine cultures obtained (for unclear reason): no growth    05/08/17: Abscess GS: >25 PMNs, many GPC in chains, few GNB. Aerobic cultures growing 2+ Enterobacter (ertapenem-sensitive) and 3+VRE again. Fungal cultures C. glabrata      Imaging/Other Relevant Diagnostics:   05/14/17 CT abd/pelvis with contrast:  -Decreased size of the fluid collection along the anterior margin of the left hepatic lobe status post percutaneous drain with small amount of residual fluid and gas in this location.   -Decreased size of the fluid collections in the left upper quadrant. No residual fluid in the ventral peritoneum at the site of drain.  -Unchanged fluid collection adjacent to the main portal vein which results in focal luminal narrowing of the main portal vein.  -Unchanged periductal edema and focal fluid collection along the right internal/external biliary drain. Additional unchanged low-attenuation liver lesion/fluid collection in segment 6 measuring 3 cm, may represent abscess, biloma or metastasis.  -Unchanged soft tissue nodule adjacent to the rectum, may represent tumor deposit.  -Unchanged moderate left pleural effusion with left lower lobe atelectasis.   -Unchanged consolidation in the right lower lobe with bronchiectasis.      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 Her post op course was c/b delayed gastric emptying, gastric distension and obstruction and a large surgical bed hematoma causing CBD and hepatic vein compression, s/p placement of a right hepatic duct external biliary drain. 2/12 rising LFTs and sepsis, 2/12 found to have enterobacter cloacae bacteremia presumed to be secondary to infection of the hematoma. However on repeat CT abdomen on 2/18 the hematoma had resolved but she was noted to have a more well-circumscribed and larger anterior abdominal collection percutaneously which was aspirated on 2/19 with 60 cc of purulent fluid removed, cultures grew enterobacter cloacae and Candida glabrata.  IR LUQ perc drain placement 3/1. IR anterior abdominal 11f perc drain placement 3/3. IR anterior perc drain into left hepatic collection 05/08/17, goal for source control with drain placements. Palliative care recs, following patient for improved pain control.     SSharisseis currently on Daptomycin 828mkg (changed from 43m67mg on 3/6),  Ertapenem and caspofungin.  Lab contacted, sensitivities requested on C.glabrata.  Will plan on 3 weeks of therapy (05/08/17 to 05/29/17) from drain placement with repeat imaging (CT abd/pelvis with IV contrast) near end of the antibiotic/antifungal course to determine if further therapy warranted.  ID (Team 3) will continue to follow patient    Recommendations:   Continue IV Daptomycin (8 mg/kg) 500 mg daily   Continue IV Ertapenem 1 gram daily   Continue IV Caspofungin 50 mg daily   Please follow weekly: CK (while on Daptomycin) and LFTs (while on antifungal)   Please follow weekly (or more often per teams/as indicated) CBC with diff, BMP, and CRP/ESR   Follow C.glabrata sensitivities (requested)   ID (Team 3)  will continue to follow    Thank you for allowing us [name] participate in the care of this patient  Please call with questions  .   ValLonzo CandyNP-BC  Infectious Diseases, Team 3  Pager [phone]

## 2017-05-15 NOTE — Plan of Care (Signed)
Problem: Mobility  Goal: Patient's functional status is maintained or improved  Outcome: Maintaining  Pt still    Very weak deconditioned , not  Very  Motivated to try to get up and do things .     Problem: Nutrition  Goal: Patient's nutritional status is maintained or improved  Outcome: Progressing towards goal  Pt continues c poor po intake .  Now has tube feedings     Problem: Bowel Elimination  Goal: Elimination patterns are normal or improving  Outcome: Maintaining  Pt c large loose bowel movement today     Problem: Psychosocial  Goal: Demonstrates ability to cope with illness  Outcome: Progressing towards goal  Pt gets easily emotional , especially when asked to do things ie , getting up oob to chair , needs a lot of reassurance and encouragement ,. Has a supportive daughter who is at pt's bedside  Daily .

## 2017-05-15 NOTE — Consults (Signed)
Medical Nutrition Therapy Brief Note: TPN check    Patient remains on TPN for nutrition support. Chart, labs, medications / TPN order, I/Os reviewed. TFs started last night, Jevity 1.5. Would provide 2340 kcal, 87 g protein and 1365 ml free water daily. Na 132 (WNL), K+ 4.2 (WNL), Cl 97 (WNL), CO2 23 (WNL), Ca 9.68 (WNL corrected for low albumin), PO4 4.1 (WNL), Mg 1.7 (WNL). Recent t bili 0.2 (WNL) from 3/13. Recent BGs ranging from 118 - 157 over the past 24 hours.  MAR reviewed with repletions noted for Mg sulfate. I/Os significant for 100 ml drain output, BM x1. Current TPN order reviewed - no changes to TPN order needed at this time.    Recommendations:  1.  Continue regular diet with supplements as ordered   2.  Continue TPN as ordered: TPN 148mL/hr x 11hrs then taper to 59mL/hr x 1 hr  Amino acids:69g/L  Dextrose: 173g/L   Standard additives except the following:   Na: 166mEq/L  K: 11mEq/L  Ca: 64mEq/L  Mg: 19mEq/L  PO4: 21mmol/L  200 mg levocarnitine   Maximize Chloride  Insulin: 0units/LITER  Fat Emulsion 20% infusion: 60gm/day (to be infused over 12 hrs)  -TPN provides: 1948kcal/day, 108g protein/day, 270g dextrose/day and 1814mL fluid/day (including amino acid/dextrose solution and lipid volume).  3.   Continue TPN lab and BG monitoring parameters  4.   Once available, recommend Osmolite 1.0@ 65 mL/hr increase by 10 mL q 4 hrs (or as tolerated) to goal of 62mL/hr x 24 hours w/ a minimum 30 mL H2O flush q 4 hrs. Add 30 ml prosource BID.Adjust free H2O flushes prn. Goal TF regimen will provide 1774kcal/d, 99g protein/d, and 1428mL free H2O/d. Continue TFs as  ordered for now.     Will continue to monitor.     Laurence Aly, [place]  Pager [phone]

## 2017-05-15 NOTE — Progress Notes (Signed)
Surgical Oncology/Hepatobiliary Surgery Progress Note     LOS: 47 days     Subjective:  IR perc drain #2 (anterior superior) removed  Dobhoff tube placed yesterday, TFs started  Afebrile  WBC stable  Mental status stable  Denies N/V, SOB, CP     Objective:    Vitals Sign Ranges for Past 24 Hours:  BP: (102-120)/(50-70)   Temp:  [36 C (96.8 F)-37.2 C (99 F)]   Temp src: Temporal (03/13 0339)  Heart Rate:  [91-117]   Resp:  [16-17]   SpO2:  [91 %-98 %]   Height:  [170.2 cm (5' 7.01")]   Weight:  [64.9 kg (143 lb)]       Physical Exam:    General Appearance: in NAD, speaking slowly/quietly  Cardiac: regular rate  Respiratory: non-labored breathing on room air  Abdomen: soft, moderately tender to palpation, distended, pre-existing ecchymosis, biliary tube clamped, LUQ drain with purulent output. Anterior drain with purulent output.   Extremities: warm    Labs:    CBC:    Recent Labs  Lab 05/15/17  0014 05/14/17  0035 05/13/17  0104 05/12/17  0152 05/11/17  0045 05/10/17  0042   WBC 11.2* 11.2* 10.8* 11.3* 11.2* 11.1*   Hemoglobin 7.2* 7.3* 7.7* 7.8* 7.9* 7.9*   Hematocrit 24* 24* 24* 24* 25* 25*   Platelets 502* 493* 504* 524* 513* 956*       Metabolic Panel:    Recent Labs  Lab 05/15/17  0014 05/14/17  0035 05/13/17  0104 05/12/17  0152 05/11/17  0045 05/10/17  0042   Sodium 132* 135 133 132* 137 130*   Potassium 4.2 4.0 4.4 4.2 4.3 4.4   Chloride 97 98 98 97 102 96   CO2 '23 24 23 23 24 23   ' UN 20 23* 22* 23* 26* 20   Creatinine 0.55 0.59 0.58 0.56 0.56 0.60   Glucose 157* 140* 246* 122* 127* 195*   Calcium 8.4* 8.5* 8.7 8.5* 8.5* 8.3*   Magnesium 1.7 1.7 1.6 1.6 1.9 1.9   Phosphorus 4.1 4.2 4.7* 4.1 4.6* 4.2          Assessment:    72 y.o. female with h/o recent PE and pancreatic cancer POD # 47  status post whipple procedure complicated by delayed gastric emptying.  NGT replaced on 04/04/17. UGI on 04/12/17 concerning for obstruction just beyond the [place] in the efferent limb.  Course complicated on 3/87/56 by rising  LFTs and sepsis with CT concerning for cholangitis given biliary tree obstruction and PV compression due to hematoma and afferent limb distention in setting of PE treatment. Is s/p IR PTC on 04/16/17 and IR percutaneous aspiration of anterior abdominal fluid collection on 04/24/17. IR LUQ perc drain placement 3/1. IR anterior abdominal 38f perc drain placement 3/3. IR anterior perc drain into left hepatic collection 3/6.    Plan:  - Regular diet + ensures  - CT scan today  - Cycle TPN, continue to encourage PO/enteral nutrition  - Aggressive bowel regimen including methylnaltrexone  - Continue ertapenem/caspo/dapto, f/u additional culture results, appreciate ID recs  - Continue capped biliary drain  - SCDs, Lovenox for VTE ppx (hold therapeutic anticoagulation)   - Dispo: pending clinical course, IR drains    NCristie Hem MD  05/15/2017     6:53 AM   General Surgery Resident    HPB-GI Attending Addendum:   I personally examined the patient, reviewed the notes, and discussed the plan of  care with the residents and patient. Agree with detailed resident's note. Please see the note above for details of history, exam, labs, assessment/plan which reflect my input.         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 Abdominal abscess on imaging. Drains placed with purulent drainage. SIRS without sepsis. Improving.      Anorexia. Encouraging oral intake. Starting tube feedings via Dobhoff.   Hyperglycemia with insulin sliding scale and recurrent need for insulin. continue   Post operative anemia. No evidence of bleeding.   Bilateral pleural effusions with lung compression. Left greater than right. Monitor for now.  Pulmonary embolism. Present on admission. No evidence of worsening.  Continue to hold anticoagulation.  Hypoalbuminemia. Moderate malnutrition. TPN. Encourage oral intake. Reglan. Nutritional  supplementation. Starting tube feedings.   Depression. Encouragement provided. Trial of new anti-depressant therapy.  Counseling. Daily encouragement.  Leukocytosis. VRE. Antibiotics transitioned due to resistant organisms. Stable.   Multiple intraabdominal abscesses with Enterobacter cloacae complex bacteremia and fungus.   Drains placed with purulent output. Flushing daily. Output minimal but purulent.   - VRE and Enterobacter cloacae bacteremia due to the intra-abdominal source  Constipation . Aggressive regimen.          Delano Metz, MD, FACS  Hepatobiliary, Pancreas & GI Surgery

## 2017-05-15 NOTE — Progress Notes (Signed)
Paperwork for SNF obtained from daughter. Pt is still not ready for discharge. She currently has Dobbhoff tube and is having poor PO intake. SW continues to monitor pts status and follow for discharge planning.

## 2017-05-15 NOTE — Progress Notes (Signed)
Palliative Care Progress Note    HPI: [name] w/ PMH fibromyalgia, pancreatic CA s/p Whipple for 'cure' 03/29/17; s/p ICU w/ intub for resp failure & septic shock, weaned from pressors and extubated to HFNC 2/17 w/ IR drainage bili fluid collection 2/19, ID following & receiving IV abx (caspo ends 3/2). Palliative following for symptoms, started methadone 2/19, weaned dose 2/23 d/t concern for sedation. Recent bowel clean out s/p enema & methylnaltrexone. Unfortunately course complicated by intraabdominal abscesses, s/p IR for placement of perc drains & evacuation of pus, ID following; pt remains on cyclic TPN w/ poor po intake. Trial ritalin 1x/d on 3/7 w/ fair effect, much better result noted 3/8 w/ 2x/d dosing. Pt w/ ongoing poor po intake, s/p fdg tube placement 3/12, started trickle TFs.    Subjective: "I still feel tired but the feeding tube isn't bothering me at all. I had a massive stool today, I didn't know my body could hold that much stool. My pain is ok"    Objective: chart reviewed, dubhoff fdg tube placed last eve, tolerating trickle TFs and has advanced up to 40 ml/hr when saw in afternoon; pt is great spirits and enjoyed visiting with dtr Mahomet ROS:  Pain   Mild  Nausea   Mild  Anorexia   Moderate  Anxiety   Mild  Depression   Mild  Shortness of Breath   Mild  Tiredness/Fatigue   Mild  Drowsiness/Sleepiness   Mild  Airway Secretions   no  Constipation   no  Unable to Respond   no  Delirium   no   Last stool 3/12    Physical Examination:   BP: (112-120)/(60-70)   Temp:  [36 C (96.8 F)-37.2 C (99 F)]   Temp src: Temporal (03/13 1220)  Heart Rate:  [91-117]   Resp:  [16-18]   SpO2:  [92 %-98 %]   Gen appearance: [name], sitting up in bed, reports intermittent mod LLQ abd pain though improved, esp around times eating/drinking  Lungs: easy resp, RA  Heart: reg  Abd: large, distended, R side perc drain x 2 and L Bili tube, stool yesterday  Extrem: mild LE  edema  Skin: pale, warm, dry  Neuro: pt more awake and interactive today. Laughing and joking, A+O x 3, at baseline ambulates short distances w/ walker, OOB to chair stand & pivot w/ walker    Assessment/Plan: [name] w/ pancreatic CA, s/p Whipple in Jan. remains on TPN, now receiving IV abx s/p IR for R side perc drain x 2 and L side bili. Pt has used total 5 mg IV dilaudid prn over last 24 hrs, reporting better pain control. S/p dubhoff fdg tube placement for trickle TFs as she has not significantly improved her appetite over last few weeks and is in line with dispo plan for SNF Rehab.    Pain/dyspnea  Acetaminophen 1 gm po TID  Methadone 5/7.5/7.5 mg po TID -total 20 mg/d (3/1 ECG w/ QTc 430)  Dilaudid 0.5 mg IV Q 1 hr prn (x 1 on 3/13)    Dilaudid 1 mg IV Q 1 hr prn (x 4 on 3/12, x 1 on 3/13)  Lidocaine patch daily  Encourage incentive spirometry prn     Anxiety/agitation/nausea/fatigue  Amitriptyline 25 mg po QHS  Duloxetine 20 mg po QHS (started 3/4, switched from lexapro)  Reglan 10 mg po 4x/d (AC & QHS)  Pantoprazole 40 mg po daily  Lorazepam  0.5 mg po Q 6 hrs prn (last x 1 on 3/7 QHS)  Ondansetron 4 mg IV Q 6 hrs prn (last x 1 on 3/10)  Ritalin 5 mg po 2x/d at 8 am and 12 pm (increased 3/8 w/ +effect, continue for now)  S/p Dronabinol 5 mg po BID (started 3/12, stopped d/t causing some confusion)  Mirtazapine 7.5 mg po QHS (started 3/13)    Prevention of constipation, last stool 3/12  Colace 200 mg po BID  Miralax 17 gm po BID  Senna 2 tab po BID  Bisacodyl supp pr daily (started 3/8 and hasn't received since)    GOC/prognosis  Full code  Dtr Rod Mae is HCP  Dispo plans now for SNF Rehab (s/p dubhoff fdg tube placement tolerating trickle TFs, ideally she may feel better w/ some nutrition and regain her own appetite or may need PEG before dispo)    Pt case discussed w/ RN. We will continue to follow along in background for support, please call if questions/concerns or if pt needs to be  seen.    Total Time Spent 45 minutes: >50% of time was spent in counseling and/or coordination of care.     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    Hypothyroidism E03.9    Pancreatic cancer C25.9    Anemia D64.9     hx of Pulmonary embolism I26.99    Depression F32.9    Sepsis A41.9       No Known Allergies (drug, envir, food or latex)    Scheduled Meds:    mirtazapine  7.5 mg Oral Nightly    PROSOURCE NO CARB  30 mL Oral BID WC    daptomycin IV  8 mg/kg Intravenous Q24H    ertapenem  1,000 mg Intravenous Q24H    caspofungin  50 mg Intravenous Q24H    ascorbic acid  500 mg Oral Daily with breakfast    ferrous sulfate  300 mg Oral Daily    methadone  5 mg Oral Daily    And    methadone  7.5 mg Oral BID    senna  2 tablet Oral 2 times per day    bisacodyl  10 mg Rectal Daily    enoxaparin  40 mg Subcutaneous Daily    methylphenidate  5 mg Oral BID    insulin glargine  12 Units Subcutaneous Nightly    DULoxetine  20 mg Oral Daily    metoclopramide  10 mg Oral 4x Daily AC & HS    lidocaine  1 patch Transdermal Q24H    docusate sodium  200 mg Oral 2 times per day    polyethylene glycol  17 g Oral 2 times per day    pantoprazole  40 mg Oral QAM    acetaminophen  1,000 mg Oral Q8H    levothyroxine  137 mcg Oral Daily    amitriptyline  25 mg Oral Nightly       Continuous Infusions:    TPN (2 in 1) Adult Cyclical      And    fat emulsion soybean oil      sodium chloride Stopped (05/07/17 0708)       PRN Meds:  calcium carbonate, HYDROmorphone PF **OR** HYDROmorphone PF,  LORazepam, heparin lock flush, ondansetron

## 2017-05-16 LAB — CBC AND DIFFERENTIAL
Baso # K/uL: 0.1 10*3/uL (ref 0.0–0.1)
Baso # K/uL: 0.1 10*3/uL (ref 0.0–0.1)
Basophil %: 0.6 %
Basophil %: 0.8 %
Eos # K/uL: 0.2 10*3/uL (ref 0.0–0.4)
Eos # K/uL: 0.2 10*3/uL (ref 0.0–0.4)
Eosinophil %: 1.6 %
Eosinophil %: 2 %
Hematocrit: 23 % — ABNORMAL LOW (ref 34–45)
Hematocrit: 23 % — ABNORMAL LOW (ref 34–45)
Hemoglobin: 7.1 g/dL — ABNORMAL LOW (ref 11.2–15.7)
Hemoglobin: 7.2 g/dL — ABNORMAL LOW (ref 11.2–15.7)
IMM Granulocytes #: 0.1 10*3/uL
IMM Granulocytes #: 0.1 10*3/uL
IMM Granulocytes: 0.7 %
IMM Granulocytes: 0.9 %
Lymph # K/uL: 1.3 10*3/uL (ref 1.2–3.7)
Lymph # K/uL: 1.3 10*3/uL (ref 1.2–3.7)
Lymphocyte %: 10.9 %
Lymphocyte %: 12.1 %
MCH: 28 pg/cell (ref 26–32)
MCH: 28 pg/cell (ref 26–32)
MCHC: 31 g/dL — ABNORMAL LOW (ref 32–36)
MCHC: 31 g/dL — ABNORMAL LOW (ref 32–36)
MCV: 92 fL (ref 79–95)
MCV: 93 fL (ref 79–95)
Mono # K/uL: 0.9 10*3/uL (ref 0.2–0.9)
Mono # K/uL: 1 10*3/uL — ABNORMAL HIGH (ref 0.2–0.9)
Monocyte %: 8.6 %
Monocyte %: 8.6 %
Neut # K/uL: 7.9 10*3/uL — ABNORMAL HIGH (ref 1.6–6.1)
Neut # K/uL: 9.1 10*3/uL — ABNORMAL HIGH (ref 1.6–6.1)
Nucl RBC # K/uL: 0 10*3/uL (ref 0.0–0.0)
Nucl RBC # K/uL: 0 10*3/uL (ref 0.0–0.0)
Nucl RBC %: 0 /100 WBC (ref 0.0–0.2)
Nucl RBC %: 0 /100 WBC (ref 0.0–0.2)
Platelets: 478 10*3/uL — ABNORMAL HIGH (ref 160–370)
Platelets: 479 10*3/uL — ABNORMAL HIGH (ref 160–370)
RBC: 2.5 MIL/uL — ABNORMAL LOW (ref 3.9–5.2)
RBC: 2.5 MIL/uL — ABNORMAL LOW (ref 3.9–5.2)
RDW: 17.9 % — ABNORMAL HIGH (ref 11.7–14.4)
RDW: 17.9 % — ABNORMAL HIGH (ref 11.7–14.4)
Seg Neut %: 76.2 %
Seg Neut %: 77 %
WBC: 10.3 10*3/uL — ABNORMAL HIGH (ref 4.0–10.0)
WBC: 11.8 10*3/uL — ABNORMAL HIGH (ref 4.0–10.0)

## 2017-05-16 LAB — COMPREHENSIVE METABOLIC PANEL
ALT: 70 U/L — ABNORMAL HIGH (ref 0–35)
AST: 71 U/L — ABNORMAL HIGH (ref 0–35)
Albumin: 2.3 g/dL — ABNORMAL LOW (ref 3.5–5.2)
Alk Phos: 606 U/L — ABNORMAL HIGH (ref 35–105)
Anion Gap: 13 (ref 7–16)
Bilirubin,Total: 0.2 mg/dL (ref 0.0–1.2)
CO2: 22 mmol/L (ref 20–28)
Calcium: 8.3 mg/dL — ABNORMAL LOW (ref 8.6–10.2)
Chloride: 97 mmol/L (ref 96–108)
Creatinine: 0.5 mg/dL — ABNORMAL LOW (ref 0.51–0.95)
GFR,Black: 113 *
GFR,Caucasian: 98 *
Glucose: 221 mg/dL — ABNORMAL HIGH (ref 60–99)
Lab: 20 mg/dL (ref 6–20)
Potassium: 4.3 mmol/L (ref 3.3–5.1)
Sodium: 132 mmol/L — ABNORMAL LOW (ref 133–145)
Total Protein: 6.7 g/dL (ref 6.3–7.7)

## 2017-05-16 LAB — MAGNESIUM: Magnesium: 1.8 mg/dL (ref 1.6–2.5)

## 2017-05-16 LAB — POCT GLUCOSE
Glucose POCT: 118 mg/dL — ABNORMAL HIGH (ref 60–99)
Glucose POCT: 144 mg/dL — ABNORMAL HIGH (ref 60–99)
Glucose POCT: 114 mg/dL — ABNORMAL HIGH (ref 60–99)

## 2017-05-16 LAB — PHOSPHORUS: Phosphorus: 4 mg/dL (ref 2.7–4.5)

## 2017-05-16 MED ORDER — STERILE WATER FOR INJECTION (TPN USE ONLY) *I*
INTRAVENOUS | Status: AC
Start: 2017-05-16 — End: 2017-05-17
  Filled 2017-05-16: qty 717.6

## 2017-05-16 MED ORDER — MAGNESIUM SULFATE 2 GM IN 50 ML *WRAPPED*
2000.0000 mg | Freq: Once | INTRAVENOUS | Status: AC
Start: 2017-05-16 — End: 2017-05-16
  Administered 2017-05-16: 2000 mg via INTRAVENOUS
  Filled 2017-05-16: qty 50

## 2017-05-16 MED ORDER — FAT EMULSION SOYBEAN OIL 20 % IV EMUL *WRAPPED*
60.0000 g | Freq: Every day | INTRAVENOUS | Status: AC
Start: 2017-05-16 — End: 2017-05-17
  Administered 2017-05-16: 60 g via INTRAVENOUS
  Filled 2017-05-16: qty 300

## 2017-05-16 MED ORDER — PANTOPRAZOLE SODIUM 40 MG PO TBEC *I*
40.0000 mg | DELAYED_RELEASE_TABLET | Freq: Two times a day (BID) | ORAL | Status: DC
Start: 2017-05-17 — End: 2017-06-14
  Administered 2017-05-17 – 2017-06-13 (×54): 40 mg via ORAL
  Filled 2017-05-16 (×59): qty 1

## 2017-05-16 NOTE — Consults (Signed)
Medical Nutrition Therapy Brief Note: TPN check    Patient remains on TPN for nutrition support. Chart, labs, medications / TPN order, I/Os reviewed. Per RN, pt requires a lot of encouragement to eat. Na 132 (L), K+ 4.3 (WNL), Cl 97 (WNL), CO2 22 (WNL), Ca 9.66 (WNL corrected for low albumin), PO4 4.0 (WNL), Mg 1.8 (WNL). Recent t bili 0.2 (WNL) from 3/14. BGs ranging from 110 - 221 over the past 24 hours. MAR reviewed with repletions noted for Mg sulfate. I/Os significant for 600 ml PO yesterday, BM x2. Current TPN order reviewed - no changes to TPN order needed at this time.    Recommendations:  1. Continue regular diet with magic cup as ordered   2. Continue TPN as ordered: TPN 173mL/hr x 11hrs then taper to 20mL/hr x 1 hr  Amino acids:69g/L  Dextrose: 173g/L   Standard additives except the following:   Na: 129mEq/L  K: 11mEq/L  Ca: 39mEq/L  Mg: 23mEq/L  PO4: 40mmol/L  200 mg levocarnitine   Maximize Chloride  Insulin: 0units/LITER  Fat Emulsion 20% infusion: 60gm/day (to be infused over 12 hrs)  -TPN provides: 1948kcal/day, 108g protein/day, 270g dextrose/day and 1833mL fluid/day (including amino acid/dextrose solution and lipid volume).  3. Continue TPN lab and BG monitoring parameters  4. Continue to advance Osmolite 1.0 by 10 mL q 6-8 hrs (or as tolerated) to goal of 57mL/hr x 24 hours w/ a minimum 30 mL H2O flush q 4 hrs. Add 30 ml prosource BID.Adjust free H2O flushes prn. Goal TF regimen will provide 1774kcal/d, 99g protein/d, and 1464mL free H2O/d. Continue TFs as ordered for now.     Will continue to monitor.     Laurence Aly, [place]  Pager [phone]

## 2017-05-16 NOTE — Progress Notes (Signed)
SPIRITUAL ASSESSMENT NOTE    Identified Religion: Christian      I visited with the pt for a short time at the suggestion of the charge nurse. She spoke very softly and I could not always understand her. Pt said she doesn't know how much more she can take. She is discouraged and says she feels defeated. I asked if prayer would be helpful and she said yes so we prayed together. Pt could not handle more than that and said she was "sorry" she just couldn't say much.         Patient Coping: Anxious  Family Coping: Hopeful/ optimistic  Chaplain Interventions: Prayer, Reflective listening    Plan of Care: Nothing further needed at the moment. The chaplain can be paged at any time if necessary.    Lynann Beaver      11:49 AM on 05/16/2017

## 2017-05-16 NOTE — Progress Notes (Signed)
Amanda Galloway is currently functioning below her baseline and will likely require SNF rehab before returning to she prior living arrangement. she will likely return to her prior level of functioning with a short term rehab stay. However, if discharge directly to home is desired, Amanda Galloway will need 24 hour assistance with all mobility and require Home PT     PT Discharge Equipment Recommended: None if returning to their prior living environment today.    Physical Therapy Treatment Note:     05/16/17 1500   PT Tracking   PT TRACKING PT Assigned   Visit Number   Visit Number [facility]) / Treatment Day (HH) 7   Precautions/Observations   Precautions used Yes   LDA Observation IV lines;Feeding tube  (RN disconnected)   Fall Precautions General falls precautions   Other Pt supine in bed, daughter present. Agreeable for PT.   Pain Assessment   *Is the patient currently in pain? Yes   Pain (Before,During, After) Therapy During   0-10 Scale (all over)   Pain Location Generalized   Pain Intervention(s) Repositioned;Refer to nursing for pain management   Additional comments Pt reports increased pain wiht ambulation, causing shortness of breath and increased anxiety. Writer eductaed on deep breathing techniwues to assist with pain   Cognition   Arousal/Alertness Appropriate responses to stimuli   Following Commands Follows simple commands with increased time;Follows simple commands with repetition   Additional Comments PT noted wiht increased anxiety during session d/t pain   Bed Mobility   Bed mobility Tested   Supine to Sit Minimum assist ;1 person assist;Verbal cues;Side rails down;Head of bed elevated   Sit to Supine Not tested  (up in recliner)   Additional comments Pt able to bring BLE towards edge of bed to prepare for transfer. Required hand held assist from writer to pull into upright and Probation officer providing support to upper trunk.   Transfers   Transfers Tested   Sit to Stand Minimum ;1 person assist   Stand to sit  Minimum ;1 person assist   Transfer Assistive Device rolling walker   Additional comments Pt required VCs for safe hand placement for repetitive transfer trianing. Writer provided minAx1 at trunk for forward trunk flexion and transition into uprgiht. Pt relying on BUE to asssit.   Mobility   Mobility Tested   Gait Pattern Decreased cadence;Decreased R step length;Decreased R step height;Decreased L step length;Decreased L step height   Ambulation Assist Minimal;1 person assist   Ambulation Distance (Feet) 27ft, 7ft, 64ft   Ambulation Assistive Device rolling walker   Additional comments Pt ambulated at slow gait pseed with short bilateral step length. Pt requiredm inAx1 at trunk to assist with weight shifting and stability.   Therapeutic Exercises   Exercises Performed LE   Hip Flexion, Sitting Bilaterally   Hip Flexion, Sitting-Assist Active   Hip Flexion, Sitting-Reps 10   Knee Extension, Sitting (LAQ) Bilaterally   Knee Ext, Sitting (LAQ)-Assist Active   Knee Ext, Sitting (LAQ)-Reps 10   Additional comments Pt noted with improved ROM and decreased need of VCs to complete.   Balance   Balance Tested   Sitting - Static Standby assist;Unsupported   Standing - Static Contact guard;Supported   Standing - Dynamic Min assist;Supported   Additional Comments Pt completed increased static standing tolerance at sink to wash hands  and face. Pt reported increased pain causing flexion at sink requiring increased assist and VCs. Wrtier provided chair for pt to rest. Pt reported increased fatigue  and stated her legs feel 'wobbly'   PT AM-PAC Mobility   Turning over in bed? 1   Sitting down on and standing up from a chair with arms? 1   Moving from lying on back to sitting on the side of the bed? 1   Moving to and from a bed to a chair? 3   Need to walk in hospital room? 3   Climbing 3 - 5 steps with a railing? 1   Total Raw Score 10   Standardized Score 32.29   CMS 1-100% Score 77   Assessment   Brief Assessment Remains  appropriate for skilled therapy   Problem List Pain contributing to impairment;Impaired functional mobility   Additional Comments Pt was able to progress ambulation to in room distances limited by fatigue and pain. Pt willing to follow all of writer's requests, daughter supportive and motivating. Pt is functoning below baseline and would benefit from SNF rehab bout to maximize function.   Plan/Recommendation   Treatment Interventions [facility]   PT Frequency 2-4x/wk   Mobility Recommendations 1A with RW   Discharge Recommendations [facility]   Assessment/Recommendations Reviewed With: Patient;Nursing;Family   Next PT Visit progressive ambulation, higher level balance   Time Calculation   PT Timed Codes 26   PT Untimed Codes 0   PT Unbilled Time 0   PT Total Treatment 26   Plan and Onset date   Plan of Care Date 05/12/17   Treatment Start Date 04/21/17   PT Charges   $PT [facility] Charges Gait Training - code 0208 (79min);Functional Training - code 0239 (38min)     Rodena Piety, PT, DPT  Pager [phone]

## 2017-05-16 NOTE — Progress Notes (Signed)
Assumed  Care of pt at 0700.  VSS Telemetry remains .  ST . Marland Kitchen Pt oob at 0800 .  Pt was incontinent at that time .  Pt need much encouragement to stay up .  Pt will get tearful when she dosn't want to do things . Writer explained the importance of continuing to try to do  more than stay in bed all day .  Pt was able to stay up till 1100 .  Pt back to bed , c 2 person stand by assist .  Pt was able to ambulate a few steps from chair to bed , c walker .   Pt needing reassurance that she was capable of doing things .     Writer tried to give  pt verbal reassurances throughout the day . Pt seen by the chaplin today , see note for full details .  Pt tolerating tube feeds , still c no po intake other than water . Marland Kitchen  Per pt and daughter pt  ate  very little at home as a general rule .   Pt oob again to chair this afternoon .  Worked c PT see   Note for full details .  Pt mediciated c scheduled methadone , as well as prn IV dilaudid .   Pt voiding well . Positive bowel function noted .  Pt perc drains  Flushed as ordered .  Pt's abdomen  Remains distended despite multiple bowel movements .  Pt currently resting in the chair call bell in reach . ,Daughter at bedside , very supportive and involved .  Pt's son to visit pt this weekend .   Report given  to oncoming shift .

## 2017-05-17 LAB — COMPREHENSIVE METABOLIC PANEL
ALT: 64 U/L — ABNORMAL HIGH (ref 0–35)
AST: 56 U/L — ABNORMAL HIGH (ref 0–35)
Albumin: 2.5 g/dL — ABNORMAL LOW (ref 3.5–5.2)
Alk Phos: 557 U/L — ABNORMAL HIGH (ref 35–105)
Anion Gap: 12 (ref 7–16)
Bilirubin,Total: 0.2 mg/dL (ref 0.0–1.2)
CO2: 24 mmol/L (ref 20–28)
Calcium: 8.4 mg/dL — ABNORMAL LOW (ref 8.6–10.2)
Chloride: 97 mmol/L (ref 96–108)
Creatinine: 0.48 mg/dL — ABNORMAL LOW (ref 0.51–0.95)
GFR,Black: 114 *
GFR,Caucasian: 99 *
Glucose: 128 mg/dL — ABNORMAL HIGH (ref 60–99)
Lab: 20 mg/dL (ref 6–20)
Potassium: 4 mmol/L (ref 3.3–5.1)
Sodium: 133 mmol/L (ref 133–145)
Total Protein: 6.6 g/dL (ref 6.3–7.7)

## 2017-05-17 LAB — POCT GLUCOSE
Glucose POCT: 158 mg/dL — ABNORMAL HIGH (ref 60–99)
Glucose POCT: 121 mg/dL — ABNORMAL HIGH (ref 60–99)
Glucose POCT: 101 mg/dL — ABNORMAL HIGH (ref 60–99)

## 2017-05-17 LAB — PHOSPHORUS: Phosphorus: 3.5 mg/dL (ref 2.7–4.5)

## 2017-05-17 LAB — MAGNESIUM: Magnesium: 1.7 mg/dL (ref 1.6–2.5)

## 2017-05-17 MED ORDER — MAGNESIUM-OXIDE 400 (241.3 MG) MG PO TABS *WRAPPED*
400.0000 mg | ORAL_TABLET | Freq: Once | ORAL | Status: AC
Start: 2017-05-17 — End: 2017-05-17
  Administered 2017-05-17: 400 mg via ORAL
  Filled 2017-05-17 (×2): qty 1

## 2017-05-17 MED ORDER — INSULIN GLARGINE 100 UNIT/ML SC SOLN *WRAPPED*
6.0000 [IU] | Freq: Every evening | SUBCUTANEOUS | Status: DC
Start: 2017-05-17 — End: 2017-06-05
  Administered 2017-05-17 – 2017-06-04 (×18): 6 [IU] via SUBCUTANEOUS

## 2017-05-17 NOTE — Consults (Signed)
Medical Nutrition Therapy Brief Note:    If cycled TF recs desired, recommend Osmolite 1.0 80 ml/hr x 20 hours with FWF 30 ml Q4H.     Laurence Aly, [place]  Pager [phone]

## 2017-05-17 NOTE — Progress Notes (Addendum)
Surgical Oncology/Hepatobiliary Surgery Progress Note     LOS: 49 days     Subjective:  NAE, slightly tachy otherwise AVSS  TFs at 50cc  Afebrile  WBC stable  Mental status stable  Denies N/V, SOB, CP     Objective:    Vitals Sign Ranges for Past 24 Hours:  BP: (110-140)/(60-80)   Temp:  [36 C (96.8 F)-37.7 C (99.9 F)]   Temp src: Temporal (03/15 0320)  Heart Rate:  [98-116]   Resp:  [16-17]   SpO2:  [90 %-93 %]       Physical Exam:    General Appearance: in NAD, speaking slowly/quietly  Cardiac: regular rate  Respiratory: non-labored breathing on room air  Abdomen: soft, moderately tender to palpation, distended, pre-existing ecchymosis, biliary tube clamped, LUQ drain with purulent output. Anterior drain with purulent output.   Extremities: warm    Labs:    CBC:    Recent Labs  Lab 05/16/17  2350 05/16/17  0018 05/15/17  0014 05/14/17  0035 05/13/17  0104 05/12/17  0152   WBC 11.8* 10.3* 11.2* 11.2* 10.8* 11.3*   Hemoglobin 7.2* 7.1* 7.2* 7.3* 7.7* 7.8*   Hematocrit 23* 23* 24* 24* 24* 24*   Platelets 478* 479* 502* 493* 504* 528*       Metabolic Panel:    Recent Labs  Lab 05/16/17  2350 05/16/17  0018 05/15/17  0014 05/14/17  0035 05/13/17  0104 05/12/17  0152   Sodium 133 132* 132* 135 133 132*   Potassium 4.0 4.3 4.2 4.0 4.4 4.2   Chloride 97 97 97 98 98 97   CO2 _0 UN _1 23* 22* 23*   Creatinine 0.48* 0.50* 0.55 0.59 0.58 0.56   Glucose 128* 221* 157* 140* 246* 122*   Calcium 8.4* 8.3* 8.4* 8.5* 8.7 8.5*   Magnesium 1.7 1.8 1.7 1.7 1.6 1.6   Phosphorus 3.5 4.0 4.1 4.2 4.7* 4.1          Assessment:    72 y.o. female with h/o recent PE and pancreatic cancer POD # 49  status post whipple procedure complicated by delayed gastric emptying.  NGT replaced on 04/04/17. UGI on 04/12/17 concerning for obstruction just beyond the Wilson in the efferent limb.  Course complicated on 06/16/22 by rising LFTs and sepsis with CT concerning for cholangitis given biliary tree obstruction and PV compression due  to hematoma and afferent limb distention in setting of PE treatment. Is s/p IR PTC on 04/16/17 and IR percutaneous aspiration of anterior abdominal fluid collection on 04/24/17. IR LUQ perc drain placement 3/1. IR anterior abdominal 41f perc drain placement 3/3. IR anterior perc drain into left hepatic collection 3/6.    Plan:  - Regular diet + ensures  - Continue to encourage PO/enteral nutrition, TFs to goal  - D/C TPN once TFs to goal  - Aggressive bowel regimen including methylnaltrexone  - Continue ertapenem/caspo/dapto, f/u additional culture results, appreciate ID recs  - Continue capped biliary drain  - SCDs, Lovenox for VTE ppx (hold therapeutic anticoagulation)   - Dispo: pending clinical course, IR drains    NCristie Hem MD  05/17/2017     6:26 AM   General Surgery Resident      HPB-GI Attending Addendum:   I personally examined the patient, reviewed the notes, and discussed the plan of care with the residents and patient. Agree with detailed resident's note. Please see the note  above for details of history, exam, labs, assessment/plan which reflect my input.         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 Abdominal abscess on imaging. Drains placed with purulent drainage. SIRS without sepsis. Improving.      Hyperglycemia with insulin sliding scale and recurrent need for insulin. continue   Post operative anemia. Stable.  Bilateral pleural effusions. Stable. No oxygen requirement.   Pulmonary embolism. Present on admission. No evidence of worsening.  Continue to hold anticoagulation.  Hypoalbuminemia. Moderate malnutrition. Tube feedings at goal. Encourage oral intake.   Depression. Encouragement provided. Trial of new anti-depressant therapy.  Counseling. Daily encouragement.  Leukocytosis. Stable.  Multiple intraabdominal abscesses with Enterobacter cloacae complex bacteremia and fungus.    Drains with minimal output. Continue with TID flushing.   Antibiotics until 3/20  Constipation . Aggressive regimen.         Delano Metz, MD, FACS  Hepatobiliary, Pancreas & GI Surgery

## 2017-05-17 NOTE — Progress Notes (Signed)
Palliative Care Progress Note    Subjective: Amanda Galloway was very sleepy when I saw her today. She said she's feeling ok other than g-tube site which she said is very sore. [name] sleeping better and feels her spirits are a little better than last week. No new complaints. Daughter coming in later today.     Palliative Care ROS:  Pain   Mild  Nausea   None  Anorexia   Severe  Anxiety   Mild  Depression   Moderate  Shortness of Breath   None  Tiredness/Fatigue   Moderate  Constipation   no    Physical Examination:   BP: (100-140)/(60-80)   Temp:  [36.3 C (97.3 F)-37.1 C (98.8 F)]   Temp src: Temporal (03/15 1549)  Heart Rate:  [98-116]   Resp:  [16-17]   SpO2:  [90 %-95 %]   Sitting in chair, drifting in and out of sleep, in NAD. NGT in place. RRR no murmurs. Easy WOB, CTAB. Gtube site c/d/i, abdomen nontender. Trace edema. A&O x3.     Assessment/Plan: Amanda Galloway is a 72 yo female with history of pancreatic CA s/p whipple on 1/25 with prolinged hospital stay complicated by septic shock and respiratory failure, multiple intraabdominal abscesses necessitating drains, anxiety/depression and severe anorexia necessitating gtube placement for TFs. Tolerating titration of TFs with some mild pain at surgical site. Working towards discharge to SNF rehab.     No change in plan from palliative standpoint today.     Pain/dyspnea  Acetaminophen 1 gm po TID  Methadone 5/7.5/7.5 mg po TID -total 20 mg/d (3/1 ECG w/ QTc 430)  Dilaudid 0.5 mg IV Q 1 hr prn   Dilaudid 1 mg IV Q 1 hr prn   Lidocaine patch daily  Encourage incentive spirometry prn     Anxiety/agitation/nausea/fatigue  Amitriptyline 25 mg po QHS  Duloxetine 20 mg po QHS (started 3/4, switched from lexapro)  Reglan 10 mg po 4x/d (AC & QHS)  Pantoprazole 40 mg po daily  Lorazepam 0.5 mg po Q 6 hrs prn   Ondansetron 4 mg IV Q 6 hrs prn   Ritalin 5 mg po 2x/d at 8 am and 12 pm (increased 3/8 w/ +effect, continue for now)  Mirtazapine 7.5 mg po QHS (started 3/13)    Prevention of  constipation, last stool 3/14  Colace 200 mg po BID  Miralax 17 gm po BID  Senna 2 tab po BID  Bisacodyl supp pr daily (started 3/8 and hasn't received since)    GOC/prognosis  Full code  Dtr Rod Mae is HCP  Dispo plans now for SNF Rehab     Cherie Dark, MD    Total Time Spent 35 minutes:   >50% of time was spent in counseling and/or coordination of care.   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    Hypothyroidism E03.9    Pancreatic cancer C25.9    Anemia D64.9     hx of Pulmonary embolism I26.99    Depression F32.9    Sepsis A41.9       No Known Allergies (drug, envir, food or latex)    Scheduled Meds:    insulin glargine  6 Units Subcutaneous Nightly    pantoprazole  40 mg Oral BID AC    amitriptyline  25 mg Oral Nightly  PROSOURCE NO CARB  30 mL Oral BID WC    daptomycin IV  8 mg/kg Intravenous Q24H    ertapenem  1,000 mg Intravenous Q24H    caspofungin  50 mg Intravenous Q24H    ascorbic acid  500 mg Oral Daily with breakfast    ferrous sulfate  300 mg Oral Daily    methadone  5 mg Oral Daily    And    methadone  7.5 mg Oral BID    senna  2 tablet Oral 2 times per day    bisacodyl  10 mg Rectal Daily    enoxaparin  40 mg Subcutaneous Daily    DULoxetine  20 mg Oral Daily    metoclopramide  10 mg Oral 4x Daily AC & HS    lidocaine  1 patch Transdermal Q24H    docusate sodium  200 mg Oral 2 times per day    polyethylene glycol  17 g Oral 2 times per day    acetaminophen  1,000 mg Oral Q8H    levothyroxine  137 mcg Oral Daily       Continuous Infusions:    sodium chloride Stopped (05/07/17 0708)       PRN Meds:  calcium carbonate, HYDROmorphone PF **OR** HYDROmorphone PF, LORazepam, heparin lock flush, ondansetron

## 2017-05-17 NOTE — Progress Notes (Signed)
Infectious Diseases (Team 3) Follow Up Note      Personally reviewed chart, labs, medications  Patient seen.     CC:  F/U Enterobacter cloacae bacteremia, Intra-abdominal abscesses positive for VRE, enterobacter and candida glabrata      Subjective:  Amanda Galloway was very sleepy today, had a hard time staying awake for questions.  When moved in chair for exam, reports LUQ abdominal pain.  She denies F/C, N/V or diarrhea (per pt's daughter, no recent diarrhea).  Reports episodes with SOB, not related to exertion, believe it could be anxiety.     ROS: Reviewed 6 systems in detail, see above    Current Meds:  Scheduled Meds:   insulin glargine  6 Units Subcutaneous Nightly    pantoprazole  40 mg Oral BID AC    amitriptyline  25 mg Oral Nightly    PROSOURCE NO CARB  30 mL Oral BID WC    daptomycin IV  8 mg/kg Intravenous Q24H    ertapenem  1,000 mg Intravenous Q24H    caspofungin  50 mg Intravenous Q24H    ascorbic acid  500 mg Oral Daily with breakfast    ferrous sulfate  300 mg Oral Daily    methadone  5 mg Oral Daily    And    methadone  7.5 mg Oral BID    senna  2 tablet Oral 2 times per day    bisacodyl  10 mg Rectal Daily    enoxaparin  40 mg Subcutaneous Daily    DULoxetine  20 mg Oral Daily    metoclopramide  10 mg Oral 4x Daily AC & HS    lidocaine  1 patch Transdermal Q24H    docusate sodium  200 mg Oral 2 times per day    polyethylene glycol  17 g Oral 2 times per day    acetaminophen  1,000 mg Oral Q8H    levothyroxine  137 mcg Oral Daily     Continuous Infusions:   sodium chloride Stopped (05/07/17 0708)     PRN Meds:.   calcium carbonate  1,000 mg Oral BID PRN    HYDROmorphone PF  0.5 mg Intravenous Q1H PRN    Or    HYDROmorphone PF  1 mg Intravenous Q1H PRN    LORazepam  0.5 mg Oral Q6H PRN    heparin lock flush  50 Units Intracatheter PRN    ondansetron  4 mg Intravenous Q6H PRN       Objective:    BP: (100-140)/(60-80)   Temp:  [36 C (96.8 F)-37.7 C (99.9 F)]   Temp src:  Temporal (03/15 0903)  Heart Rate:  [98-116]   Resp:  [16-17]   SpO2:  [90 %-93 %]     General Appearance:  Very sleepy, grimaces during exam, speaks in nearly a whisper voice  HEENT: scleral anicteric, MMM, oropharynx clear/no exudates  Pulm: Coarse frequent crackles right base, do not clear with cough.   CV: RRR, Normal S1S2, no M/R/G  Abdomen: soft, mildly distended, TTP particularly upper quadrants, active bowel sounds, drains as noted below.   Extremities: MAE   Skin:Warm and dry to touch; no rashes  Neuro: alert and oriented x4, speech fluent, no obvious focal deficits  Lines/Drains/Tubes: LIJ TCVC 2/13 (site benign), right chest wall port (not accessed), left and midline abdominal perc drains to gravity drainage bags with small amt purulent material in tubing/bags, RUQ biliary drain clamped. All drain insertion sites benign.  Lab results: 05/16/17  2350  05/11/17  0045 05/10/17  0042  04/23/17  2343  04/21/17  2348   WBC 11.8*  < > 11.2* 11.1*  < > 18.8*  < > 15.5*   Hemoglobin 7.2*  < > 7.9* 7.9*  < > 8.9*   9.4*  < > 8.7*   Hematocrit 23*  < > 25* 25*  < > 27*  < > 26*   RBC 2.5*  < > 2.7* 2.7*  < > 3.0*  < > 3.0*   Platelets 478*  < > 513* 517*  < > 340  < > 353   Neut # K/uL 9.1*  < > 9.6* 8.6*  < > 16.0*  < > 14.4*   Lymph # K/uL 1.3  < > 0.9* 1.3  < > 1.5  < > 0.6*   Mono # K/uL 1.0*  < > 0.5 0.6  < > 0.3  < > 0.3   Eos # K/uL 0.2  < > 0.0 0.6*  < > 0.2  < > 0.0   Baso # K/uL 0.1  < > 0.0 0.0  < > 0.0  < > 0.0   Seg Neut % 77.0  < > 86.0 75.3  < > 84.4  < > 90.8   Lymphocyte % 10.9  < > 7.0 10.5  < > 7.8  < > 4.2   Monocyte % 8.6  < > 4.0 5.3  < > 1.7  < > 1.7   Eosinophil % 2.0  < > 0.0 5.3  < > 0.9  < > 0.0   Basophil % 0.6  < > 0.0 0.0  < > 0.0  < > 0.0   Bands %  --   --   --  2  < > 1  < > 2   Myelocyte %  --   --  1*  --   < > 1*  < >  --    Metamyelocyte %  --   --  1 1  < > 2*  < > 1   Promyelocyte %  --   --   --   --   --  2*  --   --    Blasts %  --   --   --   --   --   --   --  1*   <  > = values in this interval not displayed.      Recent Labs  Lab 05/16/17  2350 05/16/17  0018 05/15/17  0014   Sodium 133 132* 132*   Potassium 4.0 4.3 4.2   CO2 '24 22 23   ' UN '20 20 20   ' Creatinine 0.48* 0.50* 0.55   Glucose 128* 221* 157*   Calcium 8.4* 8.3* 8.4*         Lab results: 05/16/17  2350   Total Protein 6.6   Albumin 2.5*   ALT 64*   AST 56*   Alk Phos 557*   Bilirubin,Total 0.2       Micro:   1/25: Intraoperative bile: GS 0 pmns, no organisms, Cx: no growth  2/12: 1 set of blood cultures from the right Mediport (no peripheral bld wase sent): positive for Enterobacter cloacae complex in 70 hrs, sensitive to zosyn.   2/18 abdominal GS had >25 PMNs, many GPC in pairs/chains, many GNB and cultures 4+ Enterobacter cloacae complex. The cultures also grew 1+ Candida glabrata   2/21:  BCx from the Right peripheral IV, mediport, L IJ:  No growth  05/03/17: IR-guided I&D with drain placement of the left upper quadrant collection: GS > 25 PMNs, many GNB and GN diplococci, Cultures with 4+ Enterobacter cloacae complex (resistant to Zosyn and CTX), C. Albicans and C. Glabrata, and Candida tropicalis  05/04/17:    1 set of blood cultures from left IJ: VRE at 15.3 TTP. Daptomycin sensitivities: MIC 14mg/ml (dose dependent)   1 set from periphery: VRE and Enterobacter cloacae at 14.8 TTP   05/05/17:  IR-guided I&D with drain placement of the intrahepatic collection: GS 1-10 PMNs, very few GPC in pairs. Fungal cultures with Candida glabrata  05/05/17: Blood cultures from periphery, mediport and IJ: no growth  05/04/17: Urine cultures obtained (for unclear reason): no growth  05/08/17: Abscess GS: >25 PMNs, many GPC in chains, few GNB. Aerobic cultures growing 2+ Enterobacter (ertapenem-sensitive) and 3+VRE, Fungal: C. 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 Her post op course was c/b delayed gastric emptying, gastric distension and obstruction and a large surgical bed hematoma causing CBD and hepatic vein compression, s/p placement of a right hepatic duct external biliary drain. 2/12 rising LFTs and sepsis, 2/12 found to have enterobacter cloacae bacteremia presumed to be secondary to infection of the hematoma. However on repeat CT abdomen on 2/18 the hematoma had resolved but she was noted to have a more well-circumscribed and larger anterior abdominal collection percutaneously which was aspirated on 2/19 with 60 cc of purulent fluid removed, cultures grew enterobacter cloacae and Candida glabrata.  IR LUQ perc drain placement 3/1. IR anterior abdominal 88fperc drain placement 3/3. IR anterior perc drain into left hepatic collection 05/08/17, goal for source control with drain placements. Palliative care recs, following patient for improved pain control.     Amanda Galloway very sleepy today.  Her white count is mildly elevated but stable, she is afebrile, some overnight tachycardia but overall HDS. Pt reports occasional episodes of SOB, she attributes to anxiety, noted to have crackles RLL on exam, respirations unlabored on RA with O2 sats 95%.  Encouraged to use incentive spirometer.     She is tolerating antibiotic/antifungal regimen. Continue with plan for 3 weeks of therapy from drain placement (05/08/17 to 05/29/17) with repeat imaging (CT abd/pelvis with IV contrast) near end of the antibiotic/antifungal course to determine if further therapy warranted.      Recommendations:   Follow C.glabrata sensitivities - pending from 3/6 culture   Continue IV Daptomycin (8 mg/kg) 500 mg daily   Continue IV Ertapenem 1 gram daily   Continue IV  Caspofungin 50 mg daily   Please follow weekly: CK (while on Daptomycin) and LFTs (while on antifungal)   Please follow weekly (or more often per teams/as indicated) CBC with diff, BMP, and CRP/ESR   Encourage incentive spirometer/pumonary hygiene   ID (Team 3) will follow    Thank you for allowing [facility] participate in the care of this patient  Please call with questions  .   VaLonzo CandyANP-BC  Infectious Diseases, Team 3  Pager [phone]

## 2017-05-17 NOTE — Consults (Signed)
Medical Nutrition Therapy - Follow Up    Admit Date: 03/29/2017    Patient Summary: 72 y.o. female with h/o recent PE and pancreatic cancer status post whipple procedure complicated by delayed gastric emptying.  NGT replaced on 04/04/17. UGI on 04/12/17 concerning for obstruction just beyond the [place] in the efferent limb.  Course complicated on 0/96/28 by rising LFTs and sepsis with CT concerning for cholangitis given biliary tree obstruction and PV compression due to hematoma and afferent limb distention in setting of PE treatment. Is s/p IR PTC on 04/16/17 and IR percutaneous aspiration of anterior abdominal fluid collection on 04/24/17. IR LUQ perc drain placement 3/1. IR anterior abdominal 18f perc drain placement 3/3. IR anterior perc drain into left hepatic collection 3/6.    Pertinent Meds: levothyroxine, bowel regimen, Reglan, duloxetine, lantus, vitamin C, ferrous sulfate, methadone, cubicin, ertapenem, caspofungin, amitriptyline, Protonix, mag-ox, Mg sulfate  Pertinent Labs: reviewed- lytes, UN WNL; BGs range 114-158 past 24 hours     Reviewed I/O's: BM x2     Enteral or parenteral access: NGT (tip over gastric fundus), PCVC triple lumen    Food allergies: NKFA    Current diet: regular   TF order: Osmolite 1.0 @ 50 mL/hr increase by 10 mL q 4 hrs (or as tolerated) to goal of 65 mL/hr x 24 hours w/ a minimum 30 mL H2O flush q 4 hrs.  Supplements: 30 ml prosource BID, magic cup TID   TPN order: TPN 1363mhr x 11hrs then taper to 6483mr x 1 hr  Amino acids:69g/L  Dextrose: 173g/L   Standard additives except the following:   Na: 140m53m  K: 33mE30m Ca: 11mEq60mMg: 5mEq/L64mO4: 12mmol/19m00 mg levocarnitine   Maximize  Chloride  Insulin: 0units/LITER  Fat Emulsion 20% infusion: 60gm/day (to be infused over 12 hrs)  -TPN provides: 1948kcal/day, 108g protein/day, 270g dextrose/day and 1860mL flu81may (including amino acid/dextrose solution and lipid volume).     Nutrition Focused Physical Exam:    Edema: trace generalized, trace BUE, +1 BLE   Abdomen: tenderness/guarding, loss of appetite, +BS, +flatus   Skin: ecchymosis/bruising, surgical wound to abdomen     Anthropometrics:  Height: 170.2 cm (_0 )    Current Weight: 64.9 kg (143 lb 1.3 oz) (3/11); 90% IBW; 90% UBW  Ideal Body Weight: 72.4 kg + 10%  BMI: 22.4 kg/(m^2) underweight for age   Weight Hx: 12% loss in 5 weeks       05/13/2017 64.9 kg 143 lbs 1 oz --   04/09/2017 73.483 kg 162 lbs Actual         Estimated Nutrient Needs: (Based on admit wt 72 kg)    1800-2160 kcal/day (25-30 kcal/kg)   86-108 g protein/day (1.2-1.5 g/kg)    1800-2160 mL fluid/day (25-30 mL/kg)      Nutrition Assessment and Diagnosis:   TPN to d/c today, tolerating TFs. Reglan and Protonix use is noted. Labs reviewed, LFTs elevated, however are trending down- ALT 64, AST 56, a phos 557. TFs + prosource once at goal provide 1774 kcal, 99 g protein and 1490 ml free water daily. Pt will continue to eat and drink by mouth, however PO intakes have been minimal, will need to get majority of kcal and protein via TFs. Does drink water, Na frequently low, will need to be monitored. Pt reports she has been drinking water, no complaints about tolerance issues with TFs, TFs running at goal 65 ml/hr.     Malnutrition Status:  Pt diagnosed with moderate malnutrition 04/09/17     Nutrition Intervention:   1. Continue regular diet   2. Continue supplements as ordered  3. Continue TFs, prosource and flushes as ordered. Continue to adjusts FWF prn based in fluid status and % po intake.  4. Continue to check BMP, Mg and PO4. Replete as needed. Continue to check  BGs  5. Continue to monitor I/Os, weekly weight and % po intake     Nutrition Monitoring/Evaluation:   1. Will monitor diet tolerance and intake, TF tolerance, nutrition-related labs, weight trend, BM pattern, supplement acceptance.   2. Will follow up per high nutrition risk protocol.    Laurence Aly, [place]  Pager [phone]

## 2017-05-18 ENCOUNTER — Inpatient Hospital Stay: Payer: Medicare (Managed Care)

## 2017-05-18 LAB — POCT GLUCOSE: Glucose POCT: 89 mg/dL (ref 60–99)

## 2017-05-18 MED ORDER — MAGNESIUM SULFATE 2 GM IN 50 ML *WRAPPED*
2000.0000 mg | Freq: Once | INTRAVENOUS | Status: AC
Start: 2017-05-18 — End: 2017-05-18
  Administered 2017-05-18: 2000 mg via INTRAVENOUS
  Filled 2017-05-18: qty 50

## 2017-05-18 NOTE — Progress Notes (Addendum)
Surgical Oncology/Hepatobiliary Surgery Progress Note     LOS: 50 days     Subjective:  Feeling okay this morning. Tired. Ate a small amount of fruit yesterday. NGT came out a bit. KUB ordered.      Objective:    Vitals Sign Ranges for Past 24 Hours:  BP: (110-120)/(70-80)   Temp:  [36.3 C (97.3 F)-36.5 C (97.7 F)]   Temp src: (P) Temporal (03/16 0800)  Heart Rate:  [98-112]   Resp:  [16-18]   SpO2:  [90 %-95 %]       Physical Exam:    General Appearance: in NAD, speaking slowly/quietly  Cardiac: regular rate  Respiratory: non-labored breathing on room air  Abdomen: soft, moderately tender to palpation, distended,mild ecchymosis, biliary tube clamped, LUQ drain with purulent output. Anterior drain with purulent output.   Extremities: warm    Labs:    CBC:    Recent Labs  Lab 05/16/17  2350 05/16/17  0018 05/15/17  0014 05/14/17  0035 05/13/17  0104 05/12/17  0152   WBC 11.8* 10.3* 11.2* 11.2* 10.8* 11.3*   Hemoglobin 7.2* 7.1* 7.2* 7.3* 7.7* 7.8*   Hematocrit 23* 23* 24* 24* 24* 24*   Platelets 478* 479* 502* 493* 504* 627*       Metabolic Panel:    Recent Labs  Lab 05/16/17  2350 05/16/17  0018 05/15/17  0014 05/14/17  0035 05/13/17  0104 05/12/17  0152   Sodium 133 132* 132* 135 133 132*   Potassium 4.0 4.3 4.2 4.0 4.4 4.2   Chloride 97 97 97 98 98 97   CO2 '24 22 23 24 23 23   ' UN '20 20 20 ' 23* 22* 23*   Creatinine 0.48* 0.50* 0.55 0.59 0.58 0.56   Glucose 128* 221* 157* 140* 246* 122*   Calcium 8.4* 8.3* 8.4* 8.5* 8.7 8.5*   Magnesium 1.7 1.8 1.7 1.7 1.6 1.6   Phosphorus 3.5 4.0 4.1 4.2 4.7* 4.1          Assessment:    72 y.o. female with h/o recent PE and pancreatic cancer POD # 50  status post whipple procedure complicated by delayed gastric emptying.  NGT replaced on 04/04/17. UGI on 04/12/17 concerning for obstruction just beyond the [place] in the efferent limb.  Course complicated on 0/35/00 by rising LFTs and sepsis with CT concerning for cholangitis given biliary tree obstruction and PV compression due to  hematoma and afferent limb distention in setting of PE treatment. Is s/p IR PTC on 04/16/17 and IR percutaneous aspiration of anterior abdominal fluid collection on 04/24/17. IR LUQ perc drain placement 3/1. IR anterior abdominal 97f perc drain placement 3/3. IR anterior perc drain into left hepatic collection 3/6.    Plan:  - Regular diet + ensures  - Continue to encourage PO/enteral nutrition, TFs to goal  - Aggressive bowel regimen - suppository this morning   - Continue ertapenem/caspo/dapto, f/u additional culture results, appreciate ID recs  - lantus halved to 6 units nightly after TPN stopped   - Continue capped biliary drain  - SCDs, Lovenox for VTE ppx (hold therapeutic anticoagulation)   - Dispo: pending clinical course, IR drains    ASheryn Bison MD  05/18/2017     9:08 AM   General Surgery Resident        HPB-GI Attending Addendum:   I personally examined the patient, reviewed the notes, and discussed the plan of care with the residents and patient. Agree with detailed resident's  note. Please see the note above for details of history, exam, labs, assessment/plan which reflect my input.         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 Abdominal abscess on imaging. Drains placed with purulent drainage. SIRS without sepsis. Improving.      Hyperglycemia with insulin sliding scale and recurrent need for insulin. continue   Post operative anemia. Stable.  Bilateral pleural effusions. Stable. No oxygen requirement.   Pulmonary embolism. Present on admission. No evidence of worsening.  Continue to hold anticoagulation.  Hypoalbuminemia. Moderate malnutrition. Tube feedings at goal. Encourage oral intake.   Depression. Encouragement provided. Trial of new anti-depressant therapy.  Counseling. Daily encouragement.  Leukocytosis. Stable.  Multiple intraabdominal abscesses with Enterobacter cloacae complex  bacteremia and fungus.   Drains with minimal output. Continue with TID flushing.   Antibiotics until 3/20  Constipation . Aggressive regimen.         Delano Metz, MD, FACS  Hepatobiliary, Pancreas & GI Surgery

## 2017-05-19 LAB — CBC AND DIFFERENTIAL
Baso # K/uL: 0.1 10*3/uL (ref 0.0–0.1)
Basophil %: 1 %
Eos # K/uL: 0.3 10*3/uL (ref 0.0–0.4)
Eosinophil %: 2.8 %
Hematocrit: 23 % — ABNORMAL LOW (ref 34–45)
Hemoglobin: 7.3 g/dL — ABNORMAL LOW (ref 11.2–15.7)
IMM Granulocytes #: 0.1 10*3/uL
IMM Granulocytes: 0.7 %
Lymph # K/uL: 1.1 10*3/uL — ABNORMAL LOW (ref 1.2–3.7)
Lymphocyte %: 12.1 %
MCH: 29 pg/cell (ref 26–32)
MCHC: 32 g/dL (ref 32–36)
MCV: 92 fL (ref 79–95)
Mono # K/uL: 0.8 10*3/uL (ref 0.2–0.9)
Monocyte %: 9.1 %
Neut # K/uL: 6.8 10*3/uL — ABNORMAL HIGH (ref 1.6–6.1)
Nucl RBC # K/uL: 0 10*3/uL (ref 0.0–0.0)
Nucl RBC %: 0 /100 WBC (ref 0.0–0.2)
Platelets: 491 10*3/uL — ABNORMAL HIGH (ref 160–370)
RBC: 2.5 MIL/uL — ABNORMAL LOW (ref 3.9–5.2)
RDW: 17.6 % — ABNORMAL HIGH (ref 11.7–14.4)
Seg Neut %: 74.3 %
WBC: 9.2 10*3/uL (ref 4.0–10.0)

## 2017-05-19 LAB — COMPREHENSIVE METABOLIC PANEL
ALT: 78 U/L — ABNORMAL HIGH (ref 0–35)
AST: 70 U/L — ABNORMAL HIGH (ref 0–35)
Albumin: 2.4 g/dL — ABNORMAL LOW (ref 3.5–5.2)
Alk Phos: 569 U/L — ABNORMAL HIGH (ref 35–105)
Anion Gap: 14 (ref 7–16)
Bilirubin,Total: 0.2 mg/dL (ref 0.0–1.2)
CO2: 26 mmol/L (ref 20–28)
Calcium: 8.6 mg/dL (ref 8.6–10.2)
Chloride: 93 mmol/L — ABNORMAL LOW (ref 96–108)
Creatinine: 0.6 mg/dL (ref 0.51–0.95)
GFR,Black: 106 *
GFR,Caucasian: 92 *
Glucose: 102 mg/dL — ABNORMAL HIGH (ref 60–99)
Lab: 16 mg/dL (ref 6–20)
Potassium: 4.3 mmol/L (ref 3.3–5.1)
Sodium: 133 mmol/L (ref 133–145)
Total Protein: 7 g/dL (ref 6.3–7.7)

## 2017-05-19 LAB — MAGNESIUM: Magnesium: 1.9 mg/dL (ref 1.6–2.5)

## 2017-05-19 LAB — POCT GLUCOSE: Glucose POCT: 106 mg/dL — ABNORMAL HIGH (ref 60–99)

## 2017-05-19 LAB — PHOSPHORUS: Phosphorus: 5.2 mg/dL — ABNORMAL HIGH (ref 2.7–4.5)

## 2017-05-19 MED ORDER — MAGNESIUM SULFATE 2 GM IN 50 ML *WRAPPED*
2000.0000 mg | Freq: Once | INTRAVENOUS | Status: AC
Start: 2017-05-19 — End: 2017-05-19
  Administered 2017-05-19: 2000 mg via INTRAVENOUS
  Filled 2017-05-19: qty 50

## 2017-05-19 MED ORDER — HYDROMORPHONE HCL 2 MG/ML IJ SOLN *WRAPPED*
0.5000 mg | Freq: Once | INTRAMUSCULAR | Status: AC
Start: 2017-05-19 — End: 2017-05-19
  Administered 2017-05-19: 0.5 mg via INTRAVENOUS
  Filled 2017-05-19: qty 1

## 2017-05-19 NOTE — Progress Notes (Addendum)
Surgical Oncology/Hepatobiliary Surgery Progress Note     LOS: 51 days     Subjective:  Feeling well this morning. Did well with eating yesterday - 500 po. Reports she had some fruit and cottage cheese.      Objective:    Vitals Sign Ranges for Past 24 Hours:  BP: (110-122)/(58-70)   Temp:  [35.8 C (96.4 F)-36.5 C (97.7 F)]   Temp src: Temporal (03/17 0732)  Heart Rate:  [81-101]   Resp:  [12-16]   SpO2:  [90 %-96 %]       Physical Exam:    General Appearance: in NAD, speaking slowly/quietly  Cardiac: regular rate  Respiratory: non-labored breathing on room air  Abdomen: soft, moderately tender to palpation, distended,mild ecchymosis, biliary tube clamped, LUQ drain with purulent output. Anterior drain with purulent output.   Extremities: warm    Labs:    CBC:    Recent Labs  Lab 05/18/17  2345 05/16/17  2350 05/16/17  0018 05/15/17  0014 05/14/17  0035 05/13/17  0104   WBC 9.2 11.8* 10.3* 11.2* 11.2* 10.8*   Hemoglobin 7.3* 7.2* 7.1* 7.2* 7.3* 7.7*   Hematocrit 23* 23* 23* 24* 24* 24*   Platelets 491* 478* 479* 502* 493* 621*       Metabolic Panel:    Recent Labs  Lab 05/18/17  2345 05/16/17  2350 05/16/17  0018 05/15/17  0014 05/14/17  0035 05/13/17  0104   Sodium 133 133 132* 132* 135 133   Potassium 4.3 4.0 4.3 4.2 4.0 4.4   Chloride 93* 97 97 97 98 98   CO2 '26 24 22 23 24 23   ' UN '16 20 20 20 ' 23* 22*   Creatinine 0.60 0.48* 0.50* 0.55 0.59 0.58   Glucose 102* 128* 221* 157* 140* 246*   Calcium 8.6 8.4* 8.3* 8.4* 8.5* 8.7   Magnesium 1.9 1.7 1.8 1.7 1.7 1.6   Phosphorus 5.2* 3.5 4.0 4.1 4.2 4.7*          Assessment:    72 y.o. female with h/o recent PE and pancreatic cancer POD # 51  status post whipple procedure complicated by delayed gastric emptying.  NGT replaced on 04/04/17. UGI on 04/12/17 concerning for obstruction just beyond the [place] in the efferent limb.  Course complicated on 05/11/63 by rising LFTs and sepsis with CT concerning for cholangitis given biliary tree obstruction and PV compression due to  hematoma and afferent limb distention in setting of PE treatment. Is s/p IR PTC on 04/16/17 and IR percutaneous aspiration of anterior abdominal fluid collection on 04/24/17. IR LUQ perc drain placement 3/1. IR anterior abdominal 39f perc drain placement 3/3. IR anterior perc drain into left hepatic collection 3/6.    Plan:  - Regular diet + ensures  - magnesium repelated   - Continue to encourage PO/enteral nutrition, TFs to goal - now cycled at 20 hours   - Aggressive bowel regimen  - Continue ertapenem/caspo/dapto, f/u additional culture results, appreciate ID recs  - lantus halved to 6 units nightly after TPN stopped   - Continue capped biliary drain  - SCDs, Lovenox for VTE ppx (hold therapeutic anticoagulation)   - Dispo: pending clinical course, IR drains    ASheryn Bison MD  05/19/2017     9:29 AM   General Surgery Resident          HPB-GI Attending Addendum:   I personally examined the patient, reviewed the notes, and discussed the plan of care  with the residents and patient. Agree with detailed resident's note. Please see the note above for details of history, exam, labs, assessment/plan which reflect my input.         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 Abdominal abscess on imaging. Drains placed with purulent drainage. SIRS without sepsis. Improving.      Hyperglycemia with insulin sliding scale and recurrent need for insulin. continue   Post operative anemia. Stable.  Bilateral pleural effusions. Stable. No oxygen requirement.   Pulmonary embolism. Present on admission. No evidence of worsening.  Continue to hold anticoagulation.  Hypoalbuminemia. Moderate malnutrition. Tube feedings at goal. Encourage oral intake.   Depression. Encouragement provided. Slightly improved.   Leukocytosis. Improved.  Multiple intraabdominal abscesses with Enterobacter cloacae complex bacteremia and fungus.  Second drain with minimal output and clearing. Will remove today.   Antibiotics until 3/20  Constipation . Aggressive regimen.      Started to take minimal oral intake !!!     Delano Metz, MD, FACS  Hepatobiliary, Pancreas & GI Surgery

## 2017-05-19 NOTE — Progress Notes (Signed)
Perc drain removal note    Midline perc drain removed per plan. Suture cut. Drain cut then clamped and pulled. Tip intact. Dressing placed. Procedure tolerated well.    Sheryn Bison, MD

## 2017-05-20 ENCOUNTER — Other Ambulatory Visit: Payer: Self-pay | Admitting: Cardiology

## 2017-05-20 DIAGNOSIS — R000 Tachycardia, unspecified: Secondary | ICD-10-CM

## 2017-05-20 LAB — POCT GLUCOSE
Glucose POCT: 106 mg/dL — ABNORMAL HIGH (ref 60–99)
Glucose POCT: 96 mg/dL (ref 60–99)

## 2017-05-20 LAB — PREALBUMIN: Prealbumin: 11 mg/dL — ABNORMAL LOW (ref 20–40)

## 2017-05-20 MED ORDER — FLUCONAZOLE 200 MG PO TABS *I*
800.0000 mg | ORAL_TABLET | Freq: Every day | ORAL | Status: DC
Start: 2017-05-20 — End: 2017-06-14
  Administered 2017-05-20 – 2017-06-13 (×24): 800 mg via ORAL
  Filled 2017-05-20 (×27): qty 4

## 2017-05-20 NOTE — Progress Notes (Signed)
05/20/17 1510   [facility] Patient Class Review   Patient Class Review Inpatient   Patient class effective 03/29/17    Lucas Mallow, RN  Utilization Review  X [phone]  Pager [phone]

## 2017-05-20 NOTE — Progress Notes (Signed)
Infectious Diseases (Team 3) Follow Up Note      Personally reviewed chart, labs, medications  Patient seen.     CC:  F/U Enterobacter cloacae bacteremia, Intra-abdominal abscesses positive for VRE, enterobacter and candida glabrata      Subjective:  Denies F/C, N/V/D, continues with poor appetite, very poor po intake, tolerating tube feeds via [place],  Continues with abdominal pain stating methadone "just takes the edge off".     ROS: Reviewed 6 systems in detail, see above    Current Meds:  Scheduled Meds:   insulin glargine  6 Units Subcutaneous Nightly    pantoprazole  40 mg Oral BID AC    amitriptyline  25 mg Oral Nightly    PROSOURCE NO CARB  30 mL Oral BID WC    daptomycin IV  8 mg/kg Intravenous Q24H    ertapenem  1,000 mg Intravenous Q24H    caspofungin  50 mg Intravenous Q24H    ascorbic acid  500 mg Oral Daily with breakfast    methadone  5 mg Oral Daily    And    methadone  7.5 mg Oral BID    senna  2 tablet Oral 2 times per day    bisacodyl  10 mg Rectal Daily    enoxaparin  40 mg Subcutaneous Daily    DULoxetine  20 mg Oral Daily    metoclopramide  10 mg Oral 4x Daily AC & HS    lidocaine  1 patch Transdermal Q24H    docusate sodium  200 mg Oral 2 times per day    polyethylene glycol  17 g Oral 2 times per day    acetaminophen  1,000 mg Oral Q8H    levothyroxine  137 mcg Oral Daily     Continuous Infusions:    PRN Meds:.   calcium carbonate  1,000 mg Oral BID PRN    HYDROmorphone PF  0.5 mg Intravenous Q1H PRN    Or    HYDROmorphone PF  1 mg Intravenous Q1H PRN    LORazepam  0.5 mg Oral Q6H PRN    heparin lock flush  50 Units Intracatheter PRN    ondansetron  4 mg Intravenous Q6H PRN       Objective:    BP: (120-128)/(60-76)   Temp:  [36 C (96.8 F)-36.7 C (98.1 F)]   Temp src: Temporal (03/18 1142)  Heart Rate:  [97-104]   Resp:  [16-20]   SpO2:  [91 %-96 %]     General Appearance:  Ill appearing, speaks slowly in whisper level voice, grimaces when moves  HEENT: scleral  anicteric, MMM, oropharynx clear/no exudates  Pulm: diminished bilaterally with poor inspiratory effort, crackles bilateral bases R>L.   CV: RRR, Normal S1S2, no M/R/G  Abdomen: soft, distended, TTP upper quadrants, active bowel sounds, APDL drains as noted below.   Extremities: MAE weakly  Skin:Warm and dry to touch; no rashes  Neuro: alert and oriented x4, speech fluent, no obvious focal deficits  Lines/Drains/Tubes: LIJ TCVC 2/13 (site benign), right chest wall port (not accessed), left abdominal perc drain to gravity drainage bag, RUQ biliary drain clamped. Drain insertion sites benign.       Lab results: 05/18/17  2345  05/11/17  0045 05/10/17  0042  04/23/17  2343  04/21/17  2348   WBC 9.2  < > 11.2* 11.1*  < > 18.8*  < > 15.5*   Hemoglobin 7.3*  < > 7.9* 7.9*  < > 8.9*  9.4*  < > 8.7*   Hematocrit 23*  < > 25* 25*  < > 27*  < > 26*   RBC 2.5*  < > 2.7* 2.7*  < > 3.0*  < > 3.0*   Platelets 491*  < > 513* 517*  < > 340  < > 353   Neut # K/uL 6.8*  < > 9.6* 8.6*  < > 16.0*  < > 14.4*   Lymph # K/uL 1.1*  < > 0.9* 1.3  < > 1.5  < > 0.6*   Mono # K/uL 0.8  < > 0.5 0.6  < > 0.3  < > 0.3   Eos # K/uL 0.3  < > 0.0 0.6*  < > 0.2  < > 0.0   Baso # K/uL 0.1  < > 0.0 0.0  < > 0.0  < > 0.0   Seg Neut % 74.3  < > 86.0 75.3  < > 84.4  < > 90.8   Lymphocyte % 12.1  < > 7.0 10.5  < > 7.8  < > 4.2   Monocyte % 9.1  < > 4.0 5.3  < > 1.7  < > 1.7   Eosinophil % 2.8  < > 0.0 5.3  < > 0.9  < > 0.0   Basophil % 1.0  < > 0.0 0.0  < > 0.0  < > 0.0   Bands %  --   --   --  2  < > 1  < > 2   Myelocyte %  --   --  1*  --   < > 1*  < >  --    Metamyelocyte %  --   --  1 1  < > 2*  < > 1   Promyelocyte %  --   --   --   --   --  2*  --   --    Blasts %  --   --   --   --   --   --   --  1*   < > = values in this interval not displayed.      Recent Labs  Lab 05/18/17  2345 05/16/17  2350 05/16/17  0018   Sodium 133 133 132*   Potassium 4.3 4.0 4.3   CO2 '26 24 22   ' UN '16 20 20   ' Creatinine 0.60 0.48* 0.50*   Glucose 102* 128* 221*      Calcium 8.6 8.4* 8.3*         Lab results: 05/18/17  2345 05/16/17  2350 05/16/17  0018 05/15/17  0014 05/14/17  0035   Total Protein 7.0 6.6 6.7 6.7 6.8   Albumin 2.4* 2.5* 2.3* 2.4* 2.4*   ALT 78* 64* 70* 83* 55*   AST 70* 56* 71* 105* 51*   Alk Phos 569* 557* 606* 617* 471*   Bilirubin,Total 0.2 0.2 0.2 0.2 <0.2         Lab results: 05/15/17  0014   CK 49       Micro:   1/25: Intraoperative bile: GS 0 pmns, no organisms, Cx: no growth  2/12: 1 set of blood cultures from the right Mediport (no peripheral bld wase sent): positive for Enterobacter cloacae complex in 70 hrs, sensitive to zosyn.   2/18 abdominal GS had >25 PMNs, many GPC in pairs/chains, many GNB and cultures 4+ Enterobacter cloacae complex. The cultures also grew 1+ Candida glabrata  2/21: BCx from the Right peripheral IV, mediport, L IJ:  No growth  05/03/17: IR-guided I&D with drain placement of the left upper quadrant collection: GS > 25 PMNs, many GNB and GN diplococci, Cultures with 4+ Enterobacter cloacae complex (resistant to Zosyn and CTX), C. Albicans and C. Glabrata, and Candida tropicalis  05/04/17:    1 set of blood cultures from left IJ: VRE at 15.3 TTP. Daptomycin sensitivities: MIC 9mg/ml (dose dependent)   1 set from periphery: VRE and Enterobacter cloacae at 14.8 TTP   05/05/17:  IR-guided I&D with drain placement of the intrahepatic collection: GS 1-10 PMNs, very few GPC in pairs. Fungal cultures with Candida glabrata  05/05/17: Blood cultures from periphery, mediport and IJ: no growth  05/04/17: Urine cultures obtained (for unclear reason): no growth  05/08/17: Abscess GS: >25 PMNs, many GPC in chains, few GNB. Aerobic cultures growing 2+ Enterobacter (ertapenem-sensitive) and 3+VRE, Fungal: C. 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 Her post op course was c/b delayed gastric emptying, gastric distension and obstruction and a large surgical bed hematoma causing CBD and hepatic vein compression, s/p placement of a right hepatic duct external biliary drain. 2/12 rising LFTs and sepsis, 2/12 found to have enterobacter cloacae bacteremia presumed to be secondary to infection of the hematoma. However on repeat CT abdomen on 2/18 the hematoma had resolved but she was noted to have a more well-circumscribed and larger anterior abdominal collection percutaneously which was aspirated on 2/19 with 60 cc of purulent fluid removed, cultures grew enterobacter cloacae and Candida glabrata.  IR LUQ perc drain placement 3/1. IR anterior abdominal 848fperc drain placement 3/3. IR anterior perc drain into left hepatic collection 05/08/17, goal for source control with drain placements. Palliative care recs, following patient for improved pain control.     SyKylianas tolerating antibiotic/antifungal regimen, her white count continues to down trend and she is afebrile.  Her midline abdominal drain was removed, leaving the LUQ perc drain to gravity bag and RUQ biliary tube which is clamped.  Her main complaint is abdominal pain and overall weakness. She has no appetite, only a few bites     Continue with plan for 3 weeks of therapy from drain placements (05/08/17 - 05/29/17) with repeat imaging (CT abd/pelvis with IV contrast) near end of the antibiotic/antifungal course (this week) to determine if further therapy warranted.  Sensitivities resulted on C.glabrata as above, can switch from IV caspo to oral high-dose fluconazole 1219mg daily, as below.     Recommendations:   Continue IV Daptomycin (8 mg/kg) 500 mg daily   Continue IV Ertapenem 1 gram  daily   Discontinue caspofungin, switch to oral fluconazole 12 mg/kg (round up to 800 mg) daily.    Please repeat CT A/P with contrast later this week to guide antibiotic/antifungal therapy   Please follow weekly (or more often per teams/as indicated): CBC with diff, CMP, CRP/ESR, as well as CK while on Dapto   Encourage incentive spirometer/pulmonary hygiene   ID (Team 3) will follow    Thank you for allowing us [name] participate in the care of  this patient  Please call with questions  .   Lonzo Candy, ANP-BC  Infectious Diseases, Team 3  Pager [phone]

## 2017-05-20 NOTE — Progress Notes (Addendum)
Surgical Oncology/Hepatobiliary Surgery Progress Note     LOS: 52 days     Subjective:  NAE, slightly tachy otherwise AVSS  Midline drain removed yesterday  Feeling well this morning. Did well with eating yesterday - 450 po.     Objective:    Vitals Sign Ranges for Past 24 Hours:  BP: (120-128)/(64-76)   Temp:  [35.8 C (96.4 F)-36.7 C (98.1 F)]   Temp src: Temporal (03/18 0415)  Heart Rate:  [88-103]   Resp:  [12-20]   SpO2:  [91 %-95 %]       Physical Exam:    General Appearance: in NAD, speaking slowly/quietly  Cardiac: regular rate  Respiratory: non-labored breathing on room air  Abdomen: soft, moderately tender to palpation, distended,mild ecchymosis, biliary tube clamped, LUQ drain with purulent output  Extremities: warm    Labs:    CBC:    Recent Labs  Lab 05/18/17  2345 05/16/17  2350 05/16/17  0018 05/15/17  0014 05/14/17  0035   WBC 9.2 11.8* 10.3* 11.2* 11.2*   Hemoglobin 7.3* 7.2* 7.1* 7.2* 7.3*   Hematocrit 23* 23* 23* 24* 24*   Platelets 491* 478* 479* 502* 466*       Metabolic Panel:    Recent Labs  Lab 05/18/17  2345 05/16/17  2350 05/16/17  0018 05/15/17  0014 05/14/17  0035   Sodium 133 133 132* 132* 135   Potassium 4.3 4.0 4.3 4.2 4.0   Chloride 93* 97 97 97 98   CO2 _0 UN _1 23*   Creatinine 0.60 0.48* 0.50* 0.55 0.59   Glucose 102* 128* 221* 157* 140*   Calcium 8.6 8.4* 8.3* 8.4* 8.5*   Magnesium 1.9 1.7 1.8 1.7 1.7   Phosphorus 5.2* 3.5 4.0 4.1 4.2          Assessment:    72 y.o. female with h/o recent PE and pancreatic cancer POD # 52  status post whipple procedure complicated by delayed gastric emptying.  NGT replaced on 04/04/17. UGI on 04/12/17 concerning for obstruction just beyond the [place] in the efferent limb.  Course complicated on 5/99/35 by rising LFTs and sepsis with CT concerning for cholangitis given biliary tree obstruction and PV compression due to hematoma and afferent limb distention in setting of PE treatment. Is s/p IR PTC on 04/16/17 and IR percutaneous  aspiration of anterior abdominal fluid collection on 04/24/17. IR LUQ perc drain placement 3/1. IR anterior abdominal 63f perc drain placement 3/3. IR anterior perc drain into left hepatic collection 3/6.    Plan:  - Regular diet + ensures  - Continue to encourage PO/enteral nutrition, TFs to goal - now cycled at 20 hours   - Aggressive bowel regimen  - Continue ertapenem/caspo/dapto, f/u additional culture results, appreciate ID recs  - Continue capped biliary drain  - SCDs, Lovenox for VTE ppx (hold therapeutic anticoagulation)   - Dispo: pending clinical course, IR drains    NCristie Hem MD  05/20/2017     7:10 AM   General Surgery Resident    HPB-GI Attending Addendum:   I personally examined the patient, reviewed the notes, and discussed the plan of care with the residents and patient. Agree with detailed resident's note. Please see the note above for details of history, exam, labs, assessment/plan which reflect my input.     Seen and examined on 05/20/17 at 5pm. Note delayed.  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 Abdominal abscess on imaging. Drains placed with purulent drainage. SIRS without sepsis. Improving.      Hyperglycemia with insulin sliding scale and recurrent need for insulin. continue   Post operative anemia. Stable.  Bilateral pleural effusions. Stable. No oxygen requirement.   Pulmonary embolism. Present on admission. No evidence of worsening.  Continue to hold anticoagulation.  Hypoalbuminemia. Moderate malnutrition. Tube feedings at goal. Encourage oral intake.   Depression. Encouragement provided. Slightly improved.   Leukocytosis. Improved.  Multiple intraabdominal abscesses with Enterobacter cloacae complex bacteremia and fungus. Second drain with minimal output and clearing. Will remove today.   Antibiotics until 3/20  Constipation . Aggressive regimen.      Some oral  intake. Continue encouragement.        Delano Metz, MD, FACS  Hepatobiliary, Pancreas & GI Surgery

## 2017-05-20 NOTE — Plan of Care (Signed)
Bowel Elimination     Elimination patterns are normal or improving Progressing towards goal        Fluid and Electrolyte Imbalance     Fluid and Electrolyte imbalance Progressing towards goal        GI Bleeding Elimination     Elimination of patterns are normal or improving Progressing towards goal        Mobility     Functional status is maintained or improved - Geriatric Progressing towards goal     Patient's functional status is maintained or improved Progressing towards goal        Nutrition     Patient's nutritional status is maintained or improved Progressing towards goal     Nutritional status is maintained or improved - Geriatric Progressing towards goal        Pain/Comfort     Patient's pain or discomfort is manageable Progressing towards goal        Post-Operative Bowel Elimination     Elimination pattern is normal or improving Progressing towards goal        Post-Operative Complications     Patient will remain free from symptoms of infection-post op Progressing towards goal     Prevent post-operative complications Progressing towards goal        Post-Operative Hemodynamic Stability     Maintain Hemodynamic Stability Progressing towards goal        Psychosocial     Demonstrates ability to cope with illness Progressing towards goal        Safety     Patient will remain free of falls Progressing towards goal

## 2017-05-20 NOTE — Progress Notes (Signed)
Chart reviewed, pt using less dilaudid prn (x 3 yesterday and x 2 today). Attempted to visit pt this afternoon. She was sleeping soundly so did not waken, spoke w/ dtr [name] in room. Pt still lacks appetite and getting annoyed when she is encouraged to eat. Fortunately pt is tolerating her TFs via dubhoff and had stool today.    We will continue to follow along for support. I'll plan to see pt again on Wednesday, please call if questions/concerns or if pt needs to be seen sooner.    Spent 20 minutes.    Elroy Channel NP  Palliative Care Consult Service  Pager # [phone]

## 2017-05-21 ENCOUNTER — Ambulatory Visit: Payer: Medicare (Managed Care) | Admitting: Hematology and Oncology

## 2017-05-21 LAB — CBC AND DIFFERENTIAL
Baso # K/uL: 0.1 10*3/uL (ref 0.0–0.1)
Basophil %: 0.6 %
Eos # K/uL: 0.2 10*3/uL (ref 0.0–0.4)
Eosinophil %: 2.4 %
Hematocrit: 23 % — ABNORMAL LOW (ref 34–45)
Hemoglobin: 7 g/dL — ABNORMAL LOW (ref 11.2–15.7)
IMM Granulocytes #: 0.1 10*3/uL
IMM Granulocytes: 0.8 %
Lymph # K/uL: 1.2 10*3/uL (ref 1.2–3.7)
Lymphocyte %: 12.3 %
MCH: 28 pg/cell (ref 26–32)
MCHC: 30 g/dL — ABNORMAL LOW (ref 32–36)
MCV: 92 fL (ref 79–95)
Mono # K/uL: 0.9 10*3/uL (ref 0.2–0.9)
Monocyte %: 9 %
Neut # K/uL: 7.1 10*3/uL — ABNORMAL HIGH (ref 1.6–6.1)
Nucl RBC # K/uL: 0 10*3/uL (ref 0.0–0.0)
Nucl RBC %: 0 /100 WBC (ref 0.0–0.2)
Platelets: 470 10*3/uL — ABNORMAL HIGH (ref 160–370)
RBC: 2.5 MIL/uL — ABNORMAL LOW (ref 3.9–5.2)
RDW: 17.4 % — ABNORMAL HIGH (ref 11.7–14.4)
Seg Neut %: 74.9 %
WBC: 9.5 10*3/uL (ref 4.0–10.0)

## 2017-05-21 LAB — MAGNESIUM: Magnesium: 1.7 mg/dL (ref 1.6–2.5)

## 2017-05-21 LAB — COMPREHENSIVE METABOLIC PANEL
ALT: 64 U/L — ABNORMAL HIGH (ref 0–35)
AST: 57 U/L — ABNORMAL HIGH (ref 0–35)
Albumin: 2.3 g/dL — ABNORMAL LOW (ref 3.5–5.2)
Alk Phos: 467 U/L — ABNORMAL HIGH (ref 35–105)
Anion Gap: 12 (ref 7–16)
Bilirubin,Total: <0.2 mg/dL (ref 0.0–1.2)
CO2: 27 mmol/L (ref 20–28)
Calcium: 8.5 mg/dL — ABNORMAL LOW (ref 8.6–10.2)
Chloride: 93 mmol/L — ABNORMAL LOW (ref 96–108)
Creatinine: 0.62 mg/dL (ref 0.51–0.95)
GFR,Black: 105 *
GFR,Caucasian: 91 *
Glucose: 105 mg/dL — ABNORMAL HIGH (ref 60–99)
Lab: 18 mg/dL (ref 6–20)
Potassium: 4.4 mmol/L (ref 3.3–5.1)
Sodium: 132 mmol/L — ABNORMAL LOW (ref 133–145)
Total Protein: 6.6 g/dL (ref 6.3–7.7)

## 2017-05-21 LAB — EKG 12-LEAD
P: 39 deg
PR: 172 ms
QRS: 18 deg
QRSD: 68 ms
QT: 308 ms
QTc: 409 ms
Rate: 106 {beats}/min
T: 5 deg

## 2017-05-21 LAB — POCT GLUCOSE: Glucose POCT: 116 mg/dL — ABNORMAL HIGH (ref 60–99)

## 2017-05-21 LAB — PHOSPHORUS: Phosphorus: 4.9 mg/dL — ABNORMAL HIGH (ref 2.7–4.5)

## 2017-05-21 MED ORDER — DEXTROSE 5 % FLUSH FOR PUMPS *I*
0.0000 mL/h | INTRAVENOUS | Status: DC | PRN
Start: 2017-05-21 — End: 2017-06-27

## 2017-05-21 MED ORDER — SODIUM CHLORIDE 0.9 % FLUSH FOR PUMPS *I*
0.0000 mL/h | INTRAVENOUS | Status: DC | PRN
Start: 2017-05-21 — End: 2017-06-27
  Administered 2017-05-23: 50 mL/h
  Administered 2017-05-23: 10 mL/h
  Administered 2017-05-23: 50 mL/h via INTRAVENOUS
  Administered 2017-05-23: 20 mL/h
  Administered 2017-05-23 – 2017-05-24 (×10): 10 mL/h
  Administered 2017-05-25 (×3): 5 mL/h
  Administered 2017-05-25: 5 mL/h via INTRAVENOUS
  Administered 2017-05-25 – 2017-05-26 (×4): 5 mL/h
  Administered 2017-05-26 (×2): 10 mL/h
  Administered 2017-05-26 – 2017-05-27 (×6): 5 mL/h
  Administered 2017-05-27: 5 mL/h via INTRAVENOUS
  Administered 2017-05-27: 100 mL/h via INTRAVENOUS
  Administered 2017-06-06: 50 mL/h via INTRAVENOUS
  Administered 2017-06-06: 100 mL/h
  Administered 2017-06-06: 100 mL/h via INTRAVENOUS
  Administered 2017-06-06: 100 mL/h
  Administered 2017-06-06: 50 mL/h
  Administered 2017-06-06: 20 mL/h
  Administered 2017-06-06: 100 mL/h
  Administered 2017-06-06: 50 mL/h
  Administered 2017-06-09 – 2017-06-21 (×2): 5 mL/h via INTRAVENOUS

## 2017-05-21 NOTE — Progress Notes (Addendum)
Surgical Oncology/Hepatobiliary Surgery Progress Note     LOS: 53 days     Subjective:  Feeling well this morning. Only took in 180 po yesterday. No nausea. Having bowel movements.      Objective:    Vitals Sign Ranges for Past 24 Hours:  BP: (110-126)/(70-74)   Temp:  [35.8 °C (96.4 °F)-36.6 °C (97.9 °F)]   Temp src: Temporal (03/19 0340)  Heart Rate:  [96-107]   Resp:  [6-16]   SpO2:  [91 %-96 %]       Physical Exam:    General Appearance: in NAD, speaking slowly/quietly  Cardiac: regular rate  Respiratory: non-labored breathing on room air  Abdomen: soft, moderately tender to palpation, distended, mild ecchymosis, biliary tube clamped, LUQ drain with minimal output  Extremities: warm    Labs:    CBC:    Recent Labs  Lab 05/21/17  0420 05/18/17  2345 05/16/17  2350 05/16/17  0018 05/15/17  0014   WBC 9.5 9.2 11.8* 10.3* 11.2*   Hemoglobin 7.0* 7.3* 7.2* 7.1* 7.2*   Hematocrit 23* 23* 23* 23* 24*   Platelets 470* 491* 478* 479* 502*       Metabolic Panel:    Recent Labs  Lab 05/21/17  0420 05/18/17  2345 05/16/17  2350 05/16/17  0018 05/15/17  0014   Sodium 132* 133 133 132* 132*   Potassium 4.4 4.3 4.0 4.3 4.2   Chloride 93* 93* 97 97 97   CO2 27 26 24 22 23   UN 18 16 20 20 20   Creatinine 0.62 0.60 0.48* 0.50* 0.55   Glucose 105* 102* 128* 221* 157*   Calcium 8.5* 8.6 8.4* 8.3* 8.4*   Magnesium 1.7 1.9 1.7 1.8 1.7   Phosphorus 4.9* 5.2* 3.5 4.0 4.1          Assessment:    72 y.o. female with h/o recent PE and pancreatic cancer POD # 53  status post whipple procedure complicated by delayed gastric emptying.  NGT replaced on 04/04/17. UGI on 04/12/17 concerning for obstruction just beyond the [place] in the efferent limb.  Course complicated on 04/16/17 by rising LFTs and sepsis with CT concerning for cholangitis given biliary tree obstruction and PV compression due to hematoma and afferent limb distention in setting of PE treatment. Is s/p IR PTC on 04/16/17 and IR percutaneous aspiration of anterior abdominal fluid  collection on 04/24/17. IR LUQ perc drain placement 3/1. IR anterior abdominal 8fr perc drain placement 3/3. IR anterior perc drain into left hepatic collection 3/6.    Plan:  - Regular diet + ensures  - repleat magnesium to goal   - Continue to encourage PO/enteral nutrition, TFs to goal - now cycled at 16 hours   - may d/c last drain today vs tomorrow   - Aggressive bowel regimen  - Continue ertapenem/caspo/dapto, f/u additional culture results, appreciate ID recs  - Continue capped biliary drain  - SCDs, Lovenox for VTE ppx (hold therapeutic anticoagulation)   - Dispo: pending clinical course, IR drains    Anna McGuire, MD  05/21/2017     7:50 AM   General Surgery Resident      HPB-GI Attending Addendum:   I personally examined the patient, reviewed the notes, and discussed the plan of care with the residents and patient. Agree with detailed resident's note. Please see the note above for details of history, exam, labs, assessment/plan which reflect my input.           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 Abdominal abscess on imaging.     Hyperglycemia with insulin sliding scale. Improvement with TPN off.   Post operative anemia. Stable.  Bilateral pleural effusions. Stable. No oxygen requirement.   Pulmonary embolism. Present on admission. No evidence of worsening.  Continue to hold anticoagulation.  Hypoalbuminemia. Moderate malnutrition. Tube feedings at goal. Encourage oral intake.     Some oral intake.   Depression. Encouragement provided. Slightly improved.   Leukocytosis. Resolved.   Multiple intraabdominal abscesses with Enterobacter cloacae complex bacteremia and fungus. Second drain with minimal output and clearing. Will remove today.   Antibiotics until 3/20  Constipation . Aggressive regimen.       , MD, FACS  Hepatobiliary, Pancreas & GI Surgery

## 2017-05-21 NOTE — Progress Notes (Signed)
Brief progress note    Percutaneous drain removed without issues.    Asha Grumbine, MD  05/21/2017 11:53 AM

## 2017-05-21 NOTE — Progress Notes (Signed)
Tamar Miano is currently functioning below her baseline and will likely require SNF rehab before returning to she prior living arrangement. she will likely return to her prior level of functioning with a short term rehab stay. However, if discharge directly to home is desired, Saphire Barnhart will need 24 hour assistance with all mobility and require Home PT     PT Discharge Equipment Recommended: None if returning to their prior living environment today.    Physical Therapy Treatment Note:     05/21/17 1415   PT [place]   Visit Number   Visit Number [facility]) / Treatment Day (HH) 8   Precautions/Observations   Precautions used Yes   LDA Observation Feeding tube   Fall Precautions General falls precautions   Other Pt supine in bed, appeared overall lethargic.   Pain Assessment   *Is the patient currently in pain? Yes   Pain (Before,During, After) Therapy During   0-10 Scale (did not rate)   Pain Location Abdomen   Pain Intervention(s) Repositioned;Refer to nursing for pain management   Additional comments Pt reported pain just primarily in abdomen area   Cognition   Arousal/Alertness Delayed responses to stimuli   Orientation Oriented to person   Following Commands Follows simple commands with increased time;Follows simple commands with repetition   Additional Comments Pt appeared overall lethargic. Required increased cues and directions. Requried VC and tactiles for walker management to negotiate obstacles. Pt noted wiht poor carryvoer t/o session. When working on sit to stands, focused on forward weight shift, pt unable to recall from each sit to stand without cuing.   Bed Mobility   Bed mobility Tested   Supine to Sit Minimum assist ;1 person assist;Verbal cues;Side rails up (#);Head of bed elevated   Sit to Supine Minimum assist ;1 person assist;Verbal cues;Side rails up (#);Head of bed elevated   Additional comments Pt required increased VCs on seqeuncing to complete. Assist provided at  upper trunk to assist out of bed, and assist provided at BLE for into bed. Pt relied on bed modifications to complete mobility.   Transfers   Transfers Tested   Sit to Stand Minimum ;1 person assist;Verbal cues   Stand to sit Minimum ;1 person assist;Verbal cues   Transfer Assistive Device rolling walker   Additional comments Pt required increased VCs for forward trunk flexion and weight shift. Pt requried VCs for safe hand placement. Completed x 5 t/o woth minAx1 at trun kto encourage weight shift and transition to upright.   Mobility   Mobility Tested   Gait Pattern Decreased cadence;Shuffle   Ambulation Assist Contact guard;1 person assist   Ambulation Distance (Feet) 2x51ft, x78ft   Ambulation Assistive Device rolling walker   Additional comments Pt ambulated with short shuffling gait pattern with CGA at trunk for steadying assist. Pt requried VCs for safe positioning with proximity to RW and management of RW t/o room and negotiation in room.   Balance   Balance Tested   Sitting - Static Standby assist;Unsupported   Standing - Static Contact guard;Supported   Standing - Technical brewer guard;Supported   PT AM-PAC Mobility   Turning over in bed? 1   Sitting down on and standing up from a chair with arms? 1   Moving from lying on back to sitting on the side of the bed? 1   Moving to and from a bed to a chair? 3   Need to walk in hospital room? 3  Climbing 3 - 5 steps with a railing? 1   Total Raw Score 10   Standardized Score 32.29   CMS 1-100% Score 77   Medicare Functional Limit Modifiers CL  60 - < 80% impaired, limited, restricted   Assessment   Brief Assessment Remains appropriate for skilled therapy   Problem List Pain contributing to impairment;Impaired functional mobility   Patient / Family Goal rehab   Additional Comments Pt demonstrated improved mobility able to tolerate furhter ambulation distance wiht decreased assist. however continuing to require hands on increased assist for transfers and bed  mobility. Pt following all of writer's commands t/o session. Continuing to need increased VCs and directions.   Plan/Recommendation   Treatment Interventions [facility]   PT Frequency 2-4x/wk   Mobility Recommendations 1A with RW   Discharge Recommendations [facility]   Assessment/Recommendations Reviewed With: Patient;Nursing;Family;Care coordinator;Midlevel provider   Next PT Visit progressive ambulation, transfers   Time Calculation   PT Timed Codes 27   PT Untimed Codes 0   PT Unbilled Time 0   PT Total Treatment 27   Plan and Onset date   Plan of Care Date 05/12/17   Treatment Start Date 04/21/17   PT Charges   $PT [facility] Charges Gait Training - code 0208 (36min);Functional Training - code 0239 (40min)     Rodena Piety, PT, DPT  Pager [phone]

## 2017-05-21 NOTE — Progress Notes (Signed)
Pt doing well tonight, pleasant and cooperative.  Mentating well, turning independently for bedside commode and repositioning.  Started on Osmolite 1.0 for 16 hours (though TF held for two hours to administer Synthroid) - may need to adjust the start and stop times.PO intake poor but tolerating tube feeds.  Medicated only once overnight with IV Dilaudid.

## 2017-05-22 LAB — CK: CK: 71 U/L (ref 26–192)

## 2017-05-22 LAB — POCT GLUCOSE
Glucose POCT: 103 mg/dL — ABNORMAL HIGH (ref 60–99)
Glucose POCT: 106 mg/dL — ABNORMAL HIGH (ref 60–99)

## 2017-05-22 MED ORDER — ALTEPLASE 2 MG IJ SOLR *I*
0.5000 mg | Freq: Once | INTRAMUSCULAR | Status: AC
Start: 2017-05-22 — End: 2017-05-22
  Administered 2017-05-22: 2 mg
  Filled 2017-05-22: qty 2

## 2017-05-22 MED ORDER — METHYLPHENIDATE HCL 5 MG PO TABS *I*
5.0000 mg | ORAL_TABLET | Freq: Two times a day (BID) | ORAL | Status: DC
Start: 2017-05-23 — End: 2017-05-27
  Administered 2017-05-23 – 2017-05-27 (×8): 5 mg via ORAL
  Filled 2017-05-22 (×10): qty 1

## 2017-05-22 NOTE — Progress Notes (Signed)
Surgical Oncology/Hepatobiliary Surgery Progress Note     LOS: 54 days     Subjective:  Feeling well this morning. Took in 300 po yesterday. No nausea. Having bowel movements.      Objective:    Vitals Sign Ranges for Past 24 Hours:  BP: (100-130)/(60-72)   Temp:  [35.7 C (96.3 F)-36.6 C (97.9 F)]   Temp src: Temporal (03/20 0350)  Heart Rate:  [92-105]   Resp:  [16]   SpO2:  [92 %-99 %]   Weight:  [79.8 kg (176 lb)]       Physical Exam:    General Appearance: in NAD, speaking slowly/quietly  Cardiac: regular rate  Respiratory: non-labored breathing on room air  Abdomen: soft, moderately tender to palpation, distended, mild ecchymosis, biliary tube clamped  Extremities: warm    Labs:    CBC:    Recent Labs  Lab 05/21/17  0420 05/18/17  2345 05/16/17  2350 05/16/17  0018   WBC 9.5 9.2 11.8* 10.3*   Hemoglobin 7.0* 7.3* 7.2* 7.1*   Hematocrit 23* 23* 23* 23*   Platelets 470* 491* 478* 315*       Metabolic Panel:    Recent Labs  Lab 05/21/17  0420 05/18/17  2345 05/16/17  2350 05/16/17  0018   Sodium 132* 133 133 132*   Potassium 4.4 4.3 4.0 4.3   Chloride 93* 93* 97 97   CO2 _0 UN _1 Creatinine 0.62 0.60 0.48* 0.50*   Glucose 105* 102* 128* 221*   Calcium 8.5* 8.6 8.4* 8.3*   Magnesium 1.7 1.9 1.7 1.8   Phosphorus 4.9* 5.2* 3.5 4.0          Assessment:    72 y.o. female with h/o recent PE and pancreatic cancer POD # 54  status post whipple procedure complicated by delayed gastric emptying.  NGT replaced on 04/04/17. UGI on 04/12/17 concerning for obstruction just beyond the [place] in the efferent limb.  Course complicated on 1/76/16 by rising LFTs and sepsis with CT concerning for cholangitis given biliary tree obstruction and PV compression due to hematoma and afferent limb distention in setting of PE treatment. Is s/p IR PTC on 04/16/17 and IR percutaneous aspiration of anterior abdominal fluid collection on 04/24/17. IR LUQ perc drain placement 3/1. IR anterior abdominal 5f perc drain placement  3/3. IR anterior perc drain into left hepatic collection 3/6.    Plan:  - Regular diet + ensures  - Continue to encourage PO/enteral nutrition, TFs to goal - now cycled at 16 hours   - Aggressive bowel regimen  - Continue ertapenem/caspo/dapto until 3/27, appreciate ID recs  - Continue capped biliary drain  - SCDs, Lovenox for VTE ppx (hold therapeutic anticoagulation)   - Dispo: pending clinical course, IR drains    NCristie Hem MD  05/22/2017     7:24 AM   General Surgery Resident

## 2017-05-22 NOTE — Progress Notes (Signed)
Palliative Care Progress Note    HPI: [name] w/ PMH fibromyalgia, pancreatic CA s/p Whipple for 'cure' 03/29/17; s/p ICU w/ intub for resp failure & septic shock, weaned from pressors and extubated to HFNC 2/17 w/ IR drainage bili fluid collection 2/19, ID following & receiving IV abx (caspo ends 3/2). Palliative following for symptoms, started methadone 2/19. Course complicated by abd abscesses, required TPN & methylnaltrexone for constipation, now s/p fdg tube placement 3/12 and tolerating full fds. Pt had good effect w/ ritalin 2x/d earlier this admission. Plan to restart ritalin 3/21.    Subjective: "I'm not really having pain now but I'm so fatigue that I can't do anything"    Objective: chart reviewed, pt using minimal dilaudid prn now, she was on ritalin in past for fatigue but not sure reasoning it was stopped - pt and dtr [name] in agreement w/ restarting ritalin tomorrow; pt is tolerating TFs, requiring enemas to help w/ stooling but has been going everyday    Palliative Care ROS:  Pain   Mild  Nausea   Mild  Anorexia   Moderate  Anxiety   Mild  Depression   Mild  Shortness of Breath   Mild  Tiredness/Fatigue   Mild  Drowsiness/Sleepiness   Mild  Airway Secretions   no  Constipation   no  Unable to Respond   no  Delirium   no   Last stool 3/20    Physical Examination:   BP: (100-122)/(60-72)   Temp:  [35.5 C (95.9 F)-36.4 C (97.5 F)]   Temp src: Temporal (03/20 1628)  Heart Rate:  [90-97]   Resp:  [16]   SpO2:  [92 %-94 %]   Weight:  [79.8 kg (176 lb)]   Gen appearance: [name], sitting up in bed, reports abd pain much better controlled, pleased to have drains removed, complains of debilitating fatigue and poor appetite  Lungs: easy resp, RA  Heart: reg  Abd: large, distended, L Bili tube clamped, stool today  Extrem: mild LE edema  Skin: pale, warm, dry  Neuro: pt sleepy but arousable when visiting this afternoon.  A+O x 3, at baseline ambulates short distances w/ walker, OOB to  chair stand & pivot w/ walker    Assessment/Plan: [name] w/ pancreatic CA, s/p Whipple in Jan. W/ complicated post op course, resolving abd abscesses. S/p dubhoff fdg tube now tolerating full TFs. Unfortunately she  has not significantly improved her appetite and is expected to need PEG which is in line with dispo plan for SNF Rehab. Pt had improvement w/ fatigue and mood w/ ritalin during this hospitalization w/o increased anxiety, now willing to restart ritalin.    Pain/dyspnea  Acetaminophen 1 gm po TID  Methadone 5/7.5/7.5 mg po TID -total 20 mg/d (3/1 ECG w/ QTc 430)  Dilaudid 0.5 mg IV Q 1 hr prn (x 2 on 3/19)    Dilaudid 1 mg IV Q 1 hr prn (last x 2 on 3/18)  Lidocaine patch daily  Encourage incentive spirometry prn     Anxiety/agitation/nausea/fatigue  Amitriptyline 25 mg po QHS  Duloxetine 20 mg po QHS (started 3/4, switched from lexapro)  Reglan 10 mg po 4x/d (AC & QHS)  Pantoprazole 40 mg po BID  Lorazepam 0.5 mg po Q 6 hrs prn (last x 1 on 3/7 QHS)  Ondansetron 4 mg IV Q 6 hrs prn (last x 1 on 3/10)  Restart Ritalin 5 mg po 2x/d at 8 am and 12  pm (pt had good effect in past w/ improved mood and alertness)    Prevention of constipation, last stool 3/20  Colace 200 mg po BID  Miralax 17 gm po BID  Senna 2 tab po BID  Bisacodyl supp pr daily (started 3/8 and hasn't received since)    GOC/prognosis  Full code  Dtr Rod Mae is HCP  Dispo plans now for SNF Rehab (s/p dubhoff fdg tube placement tolerating full TFs, at this time seems more likely she will need PEG before dispo)    Pt case discussed w/ covering provider. We will continue to follow along in background for support, please call if questions/concerns.    Total Time Spent 35 minutes: >50% of time was spent in counseling and/or coordination of care.     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    Malignant neoplasm of head of pancreas C25.0    Pancreatic adenocarcinoma s/p Whipple 03/29/17 C25.9    Hypothyroidism E03.9    Pancreatic cancer C25.9    Anemia D64.9     hx of Pulmonary embolism I26.99    Depression F32.9    Sepsis A41.9       No Known Allergies (drug, envir, food or latex)    Scheduled Meds:    [START ON 05/23/2017] methylphenidate  5 mg Oral BID    fluconazole  800 mg Oral Daily    insulin glargine  6 Units Subcutaneous Nightly    pantoprazole  40 mg Oral BID AC    amitriptyline  25 mg Oral Nightly    PROSOURCE NO CARB  30 mL Oral BID WC    daptomycin IV  8 mg/kg Intravenous Q24H    ertapenem  1,000 mg Intravenous Q24H    ascorbic acid  500 mg Oral Daily with breakfast    methadone  5 mg Oral Daily    And    methadone  7.5 mg Oral BID    senna  2 tablet Oral 2 times per day    bisacodyl  10 mg Rectal Daily    enoxaparin  40 mg Subcutaneous Daily    DULoxetine  20 mg Oral Daily    metoclopramide  10 mg Oral 4x Daily AC & HS    lidocaine  1 patch Transdermal Q24H    docusate sodium  200 mg Oral 2 times per day    polyethylene glycol  17 g Oral 2 times per day    acetaminophen  1,000 mg Oral Q8H    levothyroxine  137 mcg Oral Daily       Continuous Infusions:       PRN Meds:  sodium chloride, dextrose, calcium carbonate, HYDROmorphone PF **OR** HYDROmorphone PF, LORazepam, heparin lock flush, ondansetron

## 2017-05-23 ENCOUNTER — Inpatient Hospital Stay: Payer: Medicare (Managed Care)

## 2017-05-23 DIAGNOSIS — J479 Bronchiectasis, uncomplicated: Secondary | ICD-10-CM

## 2017-05-23 DIAGNOSIS — D733 Abscess of spleen: Secondary | ICD-10-CM

## 2017-05-23 LAB — CBC AND DIFFERENTIAL
Baso # K/uL: 0.1 10*3/uL (ref 0.0–0.1)
Basophil %: 0.5 %
Eos # K/uL: 0.3 10*3/uL (ref 0.0–0.4)
Eosinophil %: 2.5 %
Hematocrit: 26 % — ABNORMAL LOW (ref 34–45)
Hemoglobin: 8.3 g/dL — ABNORMAL LOW (ref 11.2–15.7)
IMM Granulocytes #: 0.1 10*3/uL
IMM Granulocytes: 0.8 %
Lymph # K/uL: 1.7 10*3/uL (ref 1.2–3.7)
Lymphocyte %: 13 %
MCH: 29 pg/cell (ref 26–32)
MCHC: 32 g/dL (ref 32–36)
MCV: 92 fL (ref 79–95)
Mono # K/uL: 1.1 10*3/uL — ABNORMAL HIGH (ref 0.2–0.9)
Monocyte %: 8.1 %
Neut # K/uL: 9.7 10*3/uL — ABNORMAL HIGH (ref 1.6–6.1)
Nucl RBC # K/uL: 0 10*3/uL (ref 0.0–0.0)
Nucl RBC %: 0 /100 WBC (ref 0.0–0.2)
Platelets: 638 10*3/uL — ABNORMAL HIGH (ref 160–370)
RBC: 2.9 MIL/uL — ABNORMAL LOW (ref 3.9–5.2)
RDW: 17.6 % — ABNORMAL HIGH (ref 11.7–14.4)
Seg Neut %: 75.1 %
WBC: 13 10*3/uL — ABNORMAL HIGH (ref 4.0–10.0)

## 2017-05-23 LAB — COMPREHENSIVE METABOLIC PANEL
ALT: 78 U/L — ABNORMAL HIGH (ref 0–35)
AST: 84 U/L — ABNORMAL HIGH (ref 0–35)
Albumin: 2.7 g/dL — ABNORMAL LOW (ref 3.5–5.2)
Alk Phos: 568 U/L — ABNORMAL HIGH (ref 35–105)
Anion Gap: 15 (ref 7–16)
Bilirubin,Total: 0.2 mg/dL (ref 0.0–1.2)
CO2: 25 mmol/L (ref 20–28)
Calcium: 8.9 mg/dL (ref 8.6–10.2)
Chloride: 92 mmol/L — ABNORMAL LOW (ref 96–108)
Creatinine: 0.64 mg/dL (ref 0.51–0.95)
GFR,Black: 104 *
GFR,Caucasian: 90 *
Glucose: 89 mg/dL (ref 60–99)
Lab: 19 mg/dL (ref 6–20)
Potassium: 4.8 mmol/L (ref 3.3–5.1)
Sodium: 132 mmol/L — ABNORMAL LOW (ref 133–145)
Total Protein: 7.7 g/dL (ref 6.3–7.7)

## 2017-05-23 LAB — POCT GLUCOSE: Glucose POCT: 97 mg/dL (ref 60–99)

## 2017-05-23 LAB — PHOSPHORUS: Phosphorus: 5.1 mg/dL — ABNORMAL HIGH (ref 2.7–4.5)

## 2017-05-23 LAB — MAGNESIUM: Magnesium: 1.8 mg/dL (ref 1.6–2.5)

## 2017-05-23 LAB — CRP: CRP: 199 mg/L — ABNORMAL HIGH (ref 0–10)

## 2017-05-23 LAB — SEDIMENTATION RATE, AUTOMATED: Sedimentation Rate: 47 mm/hr — ABNORMAL HIGH (ref 0–30)

## 2017-05-23 MED ORDER — IOHEXOL 350 MG/ML (OMNIPAQUE) IV SOLN *I*
1.0000 mL | Freq: Once | INTRAVENOUS | Status: AC
Start: 2017-05-23 — End: 2017-05-23
  Administered 2017-05-23: 119 mL via INTRAVENOUS

## 2017-05-23 MED ORDER — STERILE WATER FOR IRRIGATION IR SOLN *I*
900.0000 mL | Freq: Once | Status: AC
Start: 2017-05-23 — End: 2017-05-23
  Administered 2017-05-23: 900 mL via ORAL

## 2017-05-23 MED ORDER — MAGNESIUM SULFATE 2 GM IN 50 ML *WRAPPED*
2000.0000 mg | Freq: Once | INTRAVENOUS | Status: AC
Start: 2017-05-23 — End: 2017-05-23
  Administered 2017-05-23: 2000 mg via INTRAVENOUS
  Filled 2017-05-23: qty 50

## 2017-05-23 NOTE — Plan of Care (Signed)
Problem: Pain/Comfort  Goal: Patient's pain or discomfort is manageable  Outcome: Maintaining      Problem: Post-Operative Hemodynamic Stability  Goal: Maintain Hemodynamic Stability  Outcome: Maintaining      Problem: Post-Operative Complications  Goal: Patient will remain free from symptoms of infection-post op  Outcome: Maintaining    Goal: Prevent post-operative complications  Outcome: Maintaining      Problem: GI Bleeding Elimination  Goal: Elimination of patterns are normal or improving  Outcome: Maintaining

## 2017-05-23 NOTE — Progress Notes (Addendum)
Surgical Oncology/Hepatobiliary Surgery Progress Note     LOS: 55 days     Subjective:  Feeling well this morning. Took in 500 po yesterday. No nausea/vomiting. Passing gas, having bowel movements.      Objective:    Vitals Sign Ranges for Past 24 Hours:  BP: (100-120)/(60-74)   Temp:  [35.5 C (95.9 F)-37.2 C (99 F)]   Temp src: Temporal (03/21 0331)  Heart Rate:  [90-101]   Resp:  [16]   SpO2:  [91 %-94 %]       Physical Exam:    General Appearance: in NAD, speaking slowly/quietly  Cardiac: regular rate  Respiratory: non-labored breathing on room air  Abdomen: soft, moderately tender to palpation, distended, mild ecchymosis, biliary tube clamped  Extremities: warm    Labs:    CBC:    Recent Labs  Lab 05/23/17  0030 05/21/17  0420 05/18/17  2345 05/16/17  2350   WBC 13.0* 9.5 9.2 11.8*   Hemoglobin 8.3* 7.0* 7.3* 7.2*   Hematocrit 26* 23* 23* 23*   Platelets 638* 470* 491* 683*       Metabolic Panel:    Recent Labs  Lab 05/23/17  0030 05/21/17  0420 05/18/17  2345 05/16/17  2350   Sodium 132* 132* 133 133   Potassium 4.8 4.4 4.3 4.0   Chloride 92* 93* 93* 97   CO2 _0 UN _1 Creatinine 0.64 0.62 0.60 0.48*   Glucose 89 105* 102* 128*   Calcium 8.9 8.5* 8.6 8.4*   Magnesium 1.8 1.7 1.9 1.7   Phosphorus 5.1* 4.9* 5.2* 3.5          Assessment:    72 y.o. female with h/o recent PE and pancreatic cancer POD # 55  status post whipple procedure complicated by delayed gastric emptying.  NGT replaced on 04/04/17. UGI on 04/12/17 concerning for obstruction just beyond the [place] in the efferent limb.  Course complicated on 06/21/60 by rising LFTs and sepsis with CT concerning for cholangitis given biliary tree obstruction and PV compression due to hematoma and afferent limb distention in setting of PE treatment. Is s/p IR PTC on 04/16/17 and IR percutaneous aspiration of anterior abdominal fluid collection on 04/24/17. IR LUQ perc drain placement 3/1. IR anterior abdominal 48f perc drain placement 3/3. IR  anterior perc drain into left hepatic collection 3/6.    Plan:  - Regular diet + ensures  - Continue to encourage PO/enteral nutrition, TFs to goal - now cycled at 16 hours   - Aggressive bowel regimen  - Increased WBCs. Continue ertapenem/caspo/dapto until 3/27, appreciate ID recs  - Continue capped biliary drain  - SCDs, Lovenox for VTE ppx (hold therapeutic anticoagulation)   - Dispo: pending clinical course, IR drains    NCristie Hem MD  05/23/2017     6:18 AM   General Surgery Resident    I saw and evaluated the patient. I agree with the resident's/fellow's findings and plan of care as documented above.      JVladimir Creeks MD

## 2017-05-23 NOTE — Progress Notes (Signed)
Infectious Diseases (Team 3) Follow Up Note      Personally reviewed chart, labs, medications  Patient seen.     CC:  F/U Enterobacter cloacae bacteremia, Intra-abdominal abscesses positive for VRE, enterobacter and candida glabrata      Subjective:  Reports she is eating a little more though still small amounts, occasional nausea without emesis, denies F/C, or diarrhea. Reports she is moving her bowels.  She is feeling a little bit stronger today.  All drains except biliary tube (clamped) have been removed. She refused to have dressing around biliary drain lifted to view site, stating dressing was just changed.     ROS: Reviewed 6 systems in detail, see above    Current Meds:  Scheduled Meds:   methylphenidate  5 mg Oral BID    fluconazole  800 mg Oral Daily    insulin glargine  6 Units Subcutaneous Nightly    pantoprazole  40 mg Oral BID AC    amitriptyline  25 mg Oral Nightly    PROSOURCE NO CARB  30 mL Oral BID WC    daptomycin IV  8 mg/kg Intravenous Q24H    ertapenem  1,000 mg Intravenous Q24H    ascorbic acid  500 mg Oral Daily with breakfast    methadone  5 mg Oral Daily    And    methadone  7.5 mg Oral BID    senna  2 tablet Oral 2 times per day    bisacodyl  10 mg Rectal Daily    enoxaparin  40 mg Subcutaneous Daily    DULoxetine  20 mg Oral Daily    metoclopramide  10 mg Oral 4x Daily AC & HS    lidocaine  1 patch Transdermal Q24H    docusate sodium  200 mg Oral 2 times per day    polyethylene glycol  17 g Oral 2 times per day    acetaminophen  1,000 mg Oral Q8H    levothyroxine  137 mcg Oral Daily     Continuous Infusions:    PRN Meds:.   calcium carbonate  1,000 mg Oral BID PRN    HYDROmorphone PF  0.5 mg Intravenous Q1H PRN    Or    HYDROmorphone PF  1 mg Intravenous Q1H PRN    LORazepam  0.5 mg Oral Q6H PRN    heparin lock flush  50 Units Intracatheter PRN    ondansetron  4 mg Intravenous Q6H PRN       Objective:    BP: (106-120)/(60-76)   Temp:  [36.1 C (97 F)-37.2 C (99  F)]   Temp src: Temporal (03/21 1608)  Heart Rate:  [90-103]   Resp:  [16]   SpO2:  [91 %-94 %]     General Appearance:  Sitting in chair, smiling, NAD.   HEENT: scleral anicteric, MMM, oropharynx clear  Pulm: diminished bilaterally, crackles b/l bases but overall improved air movement  CV: RRR, Normal S1S2, no M/R/G  Abdomen:  distended, not as tender but firm upper quadrants, soft lower abdomen, hypoactive bowel sounds  Extremities: MAE weakly  Skin:Warm and dry to touch; no rashes  Neuro: alert and oriented x4, speech fluent, no obvious focal deficits  Lines/Drains/Tubes: LIJ TCVC 2/13 (site benign), right chest wall port (not accessed), RUQ biliary drain clamped, pt refused to have dressing lifted to visualize site today.        Lab results: 05/23/17  6720  05/11/17  0045 05/10/17  9470  04/23/17  9628  04/21/17  2348   WBC 13.0*  < > 11.2* 11.1*  < > 18.8*  < > 15.5*   Hemoglobin 8.3*  < > 7.9* 7.9*  < > 8.9*   9.4*  < > 8.7*   Hematocrit 26*  < > 25* 25*  < > 27*  < > 26*   RBC 2.9*  < > 2.7* 2.7*  < > 3.0*  < > 3.0*   Platelets 638*  < > 513* 517*  < > 340  < > 353   Neut # K/uL 9.7*  < > 9.6* 8.6*  < > 16.0*  < > 14.4*   Lymph # K/uL 1.7  < > 0.9* 1.3  < > 1.5  < > 0.6*   Mono # K/uL 1.1*  < > 0.5 0.6  < > 0.3  < > 0.3   Eos # K/uL 0.3  < > 0.0 0.6*  < > 0.2  < > 0.0   Baso # K/uL 0.1  < > 0.0 0.0  < > 0.0  < > 0.0   Seg Neut % 75.1  < > 86.0 75.3  < > 84.4  < > 90.8   Lymphocyte % 13.0  < > 7.0 10.5  < > 7.8  < > 4.2   Monocyte % 8.1  < > 4.0 5.3  < > 1.7  < > 1.7   Eosinophil % 2.5  < > 0.0 5.3  < > 0.9  < > 0.0   Basophil % 0.5  < > 0.0 0.0  < > 0.0  < > 0.0   Bands %  --   --   --  2  < > 1  < > 2   Myelocyte %  --   --  1*  --   < > 1*  < >  --    Metamyelocyte %  --   --  1 1  < > 2*  < > 1   Promyelocyte %  --   --   --   --   --  2*  --   --    Blasts %  --   --   --   --   --   --   --  1*   < > = values in this interval not displayed.      Recent Labs  Lab 05/23/17  0030 05/21/17  0420  05/18/17  2345   Sodium 132* 132* 133   Potassium 4.8 4.4 4.3   CO2 _0 UN _1 Creatinine 0.64 0.62 0.60   Glucose 89 105* 102*   Calcium 8.9 8.5* 8.6         Lab results: 05/23/17  0030 05/21/17  0420 05/18/17  2345 05/16/17  2350 05/16/17  0018   Total Protein 7.7 6.6 7.0 6.6 6.7   Albumin 2.7* 2.3* 2.4* 2.5* 2.3*   ALT 78* 64* 78* 64* 70*   AST 84* 57* 70* 56* 71*   Alk Phos 568* 467* 569* 557* 606*   Bilirubin,Total 0.2 <0.2 0.2 0.2 0.2         Lab results: 05/22/17  0040   CK 71       Micro:   1/25: Intraoperative bile: GS 0 pmns, no organisms, Cx: no growth  2/12: 1 set of blood cultures from the right Mediport (no peripheral bld wase sent): positive for Enterobacter cloacae complex  in 70 hrs, sensitive to zosyn.   2/18 abdominal GS had >25 PMNs, many GPC in pairs/chains, many GNB and cultures 4+ Enterobacter cloacae complex. The cultures also grew 1+ Candida glabrata   2/21: BCx from the Right peripheral IV, mediport, L IJ:  No growth  05/03/17: IR-guided I&D with drain placement of the left upper quadrant collection: GS > 25 PMNs, many GNB and GN diplococci, Cultures with 4+ Enterobacter cloacae complex (resistant to Zosyn and CTX), C. Albicans and C. Glabrata, and Candida tropicalis  05/04/17:    1 set of blood cultures from left IJ: VRE at 15.3 TTP. Daptomycin sensitivities: MIC 90mg/ml (dose dependent)   1 set from periphery: VRE and Enterobacter cloacae at 14.8 TTP   05/05/17:  IR-guided I&D with drain placement of the intrahepatic collection: GS 1-10 PMNs, very few GPC in pairs. Fungal cultures with Candida glabrata  05/05/17: Blood cultures from periphery, mediport and IJ: no growth  05/04/17: Urine cultures obtained (for unclear reason): no growth  05/08/17: Abscess GS: >25 PMNs, many GPC in chains, few GNB. Aerobic cultures growing 2+ Enterobacter (ertapenem-sensitive) and 3+VRE, Fungal: C. glabrata-sensitivities below)        Imaging/Other Relevant Diagnostics:   No new relevant imaging      Assessment and Plan:  ID problem(s):   -Enterobacter cloacae bacteremia  -Multiple and extensive Intra-abdominal abscesses growing VRE, enterobacter and candida glabrata    89yoold female with pancreatic cancer, s/p Whipple procedure on 03/29/17 after neoadjuvant chemotherapy and radiation. Her post op course was c/b delayed gastric emptying, gastric distension and obstruction and a large surgical bed hematoma causing CBD and hepatic vein compression, severe biliary sepsis, s/p placement of a right hepatic duct external biliary drain placed on 2/12, found to have enterobacter cloacae bacteremia (2/12) presumed to be secondary to infection of the hematoma. However CT of abdomen (2/18) showed the hematoma had resolved but noted to have a well-circumscribed and larger anterior abdominal collection percutaneously, which was aspirated (2/19) and cultures grew enterobacter cloacae and Candida glabrata. ID service consulted on 2/21 with question of treating C.glabrata.  Caspofungin was started and a perc drain was placed LUQ 3/1, then anterior abdominal perc drain placed 3/3, and then anterior perc drain into left hepatic collection 05/08/17, the goal being source control with drain placements. She had been on Zosyn but her enterobacter became (amp-C inducible) thus switched to ertapenem and Daptomycin added on 05/04/17 for continued broad coverage. .     Now all abdominal perc drains have been removed, only a biliary tube remains (clamped). Daptomycin and Ertapenem continue, Caspofungin was stopped in favor or oral high-dose fluconazole 112mkg daily once sensitivities returned on C.glabrata (pt on methadone for pain control as well, QTc 409). She is tolerating antibiotic/antifungal regimen, her white count overall has down trended with a mild bump today, will continue to monitor.  She is afebrile and HDS. She is feeling a bit stronger today and is happy she's been able to eat a little bit each day, still limited po  intake though and continues with tube feeds.     Plan remains for 3 weeks of treatment from drain placements (05/08/17 - 05/29/17) with repeat imaging (CT abd/pelvis with IV contrast) near end of the course to determine if further therapy warranted.      Recommendations:   Continue IV Daptomycin (8 mg/kg) 500 mg daily   Continue IV Ertapenem 1 gram daily   Continue fluconazole 12 mg/kg (round up to 800 mg) daily.  Please repeat CT A/P with contrast on Monday 3/25 to guide antibiotic/antifungal duration.    Follow CBC with diff, CMP, CRP/ESR, as well as CK while on Dapto   ID (Team 3) will follow    Thank you for allowing [name] to participate in the care of this patient  Please call with questions  .   Lonzo Candy, ANP-BC  Infectious Diseases, Team 3  Pager [phone]

## 2017-05-24 LAB — CBC AND DIFFERENTIAL
Baso # K/uL: 0.1 10*3/uL (ref 0.0–0.1)
Basophil %: 0.6 %
Eos # K/uL: 0.3 10*3/uL (ref 0.0–0.4)
Eosinophil %: 3 %
Hematocrit: 23 % — ABNORMAL LOW (ref 34–45)
Hemoglobin: 7.3 g/dL — ABNORMAL LOW (ref 11.2–15.7)
IMM Granulocytes #: 0.1 10*3/uL
IMM Granulocytes: 0.9 %
Lymph # K/uL: 1.1 10*3/uL — ABNORMAL LOW (ref 1.2–3.7)
Lymphocyte %: 11.5 %
MCH: 29 pg/cell (ref 26–32)
MCHC: 32 g/dL (ref 32–36)
MCV: 92 fL (ref 79–95)
Mono # K/uL: 0.8 10*3/uL (ref 0.2–0.9)
Monocyte %: 7.6 %
Neut # K/uL: 7.5 10*3/uL — ABNORMAL HIGH (ref 1.6–6.1)
Nucl RBC # K/uL: 0 10*3/uL (ref 0.0–0.0)
Nucl RBC %: 0 /100 WBC (ref 0.0–0.2)
Platelets: 533 10*3/uL — ABNORMAL HIGH (ref 160–370)
RBC: 2.5 MIL/uL — ABNORMAL LOW (ref 3.9–5.2)
RDW: 17.4 % — ABNORMAL HIGH (ref 11.7–14.4)
Seg Neut %: 76.4 %
WBC: 9.8 10*3/uL (ref 4.0–10.0)

## 2017-05-24 LAB — POCT GLUCOSE: Glucose POCT: 96 mg/dL (ref 60–99)

## 2017-05-24 MED ORDER — HYDROMORPHONE HCL 2 MG/ML IJ SOLN *WRAPPED*
0.5000 mg | INTRAMUSCULAR | Status: DC | PRN
Start: 2017-05-24 — End: 2017-05-24

## 2017-05-24 MED ORDER — HYDROMORPHONE HCL 2 MG/ML IJ SOLN *WRAPPED*
1.0000 mg | INTRAMUSCULAR | Status: DC | PRN
Start: 2017-05-24 — End: 2017-05-24

## 2017-05-24 MED ORDER — HYDROMORPHONE HCL 2 MG/ML IJ SOLN *WRAPPED*
0.5000 mg | INTRAMUSCULAR | Status: DC | PRN
Start: 2017-05-24 — End: 2017-05-26

## 2017-05-24 MED ORDER — HYDROMORPHONE HCL 2 MG PO TABS *I*
2.0000 mg | ORAL_TABLET | ORAL | Status: DC | PRN
Start: 2017-05-24 — End: 2017-05-26
  Administered 2017-05-25 – 2017-05-26 (×2): 2 mg via ORAL
  Filled 2017-05-24 (×3): qty 1

## 2017-05-24 MED ORDER — NALOXONE HCL 0.4 MG/ML IJ SOLN *WRAPPED*
0.1000 mg | Status: DC | PRN
Start: 2017-05-24 — End: 2017-06-18

## 2017-05-24 NOTE — Progress Notes (Signed)
Infectious Diseases (Team 3) Follow Up Note      Personally reviewed chart, labs, medications  Patient seen.     CC:  F/U Enterobacter cloacae bacteremia, Intra-abdominal abscesses positive for VRE, enterobacter and candida glabrata      Subjective:  Sitting in chair smiling, just had hair washed, continues to struggle to eat but is increasing intake slowly.  Today denies N/V/D, F/C.  Discussed recent abdominal CT scan with her, this was upsetting for her.     ROS: Reviewed 6 systems in detail, see above    Current Meds:  Scheduled Meds:   methylphenidate  5 mg Oral BID    fluconazole  800 mg Oral Daily    insulin glargine  6 Units Subcutaneous Nightly    pantoprazole  40 mg Oral BID AC    amitriptyline  25 mg Oral Nightly    PROSOURCE NO CARB  30 mL Oral BID WC    daptomycin IV  8 mg/kg Intravenous Q24H    ertapenem  1,000 mg Intravenous Q24H    ascorbic acid  500 mg Oral Daily with breakfast    methadone  5 mg Oral Daily    And    methadone  7.5 mg Oral BID    senna  2 tablet Oral 2 times per day    bisacodyl  10 mg Rectal Daily    enoxaparin  40 mg Subcutaneous Daily    DULoxetine  20 mg Oral Daily    metoclopramide  10 mg Oral 4x Daily AC & HS    lidocaine  1 patch Transdermal Q24H    docusate sodium  200 mg Oral 2 times per day    polyethylene glycol  17 g Oral 2 times per day    acetaminophen  1,000 mg Oral Q8H    levothyroxine  137 mcg Oral Daily     Continuous Infusions:    PRN Meds:.   calcium carbonate  1,000 mg Oral BID PRN    HYDROmorphone PF  0.5 mg Intravenous Q1H PRN    Or    HYDROmorphone PF  1 mg Intravenous Q1H PRN    LORazepam  0.5 mg Oral Q6H PRN    heparin lock flush  50 Units Intracatheter PRN    ondansetron  4 mg Intravenous Q6H PRN       Objective:    BP: (110-120)/(60-80)   Temp:  [36.1 C (97 F)-37.1 C (98.8 F)]   Temp src: Tympanic (03/22 1146)  Heart Rate:  [98-105]   Resp:  [16-20]   SpO2:  [92 %-95 %]     General Appearance:  NAD, moves slowly, speaks  softly  HEENT: scleral anicteric, MMM, oropharynx clear  Pulm: diminished bilaterally  CV: RRR, Normal S1S2, no M/R/G  Abdomen:  distended, soft lower abdomen,TTP today throughout, hypoactive bowel sounds  Extremities: MAE weakly  Skin:Warm and dry to touch; no rashes  Neuro: alert and oriented x4, speech fluent, no obvious focal deficits  Lines/Drains/Tubes: LIJ TCVC 2/13 (site benign), right chest wall port (not accessed), RUQ biliary drain clamped, insertion site benign.         Lab results: 05/24/17  0805  05/11/17  0045 05/10/17  0042  04/23/17  2343  04/21/17  2348   WBC 9.8  < > 11.2* 11.1*  < > 18.8*  < > 15.5*   Hemoglobin 7.3*  < > 7.9* 7.9*  < > 8.9*   9.4*  < > 8.7*   Hematocrit 23*  < >  25* 25*  < > 27*  < > 26*   RBC 2.5*  < > 2.7* 2.7*  < > 3.0*  < > 3.0*   Platelets 533*  < > 513* 517*  < > 340  < > 353   Neut # K/uL 7.5*  < > 9.6* 8.6*  < > 16.0*  < > 14.4*   Lymph # K/uL 1.1*  < > 0.9* 1.3  < > 1.5  < > 0.6*   Mono # K/uL 0.8  < > 0.5 0.6  < > 0.3  < > 0.3   Eos # K/uL 0.3  < > 0.0 0.6*  < > 0.2  < > 0.0   Baso # K/uL 0.1  < > 0.0 0.0  < > 0.0  < > 0.0   Seg Neut % 76.4  < > 86.0 75.3  < > 84.4  < > 90.8   Lymphocyte % 11.5  < > 7.0 10.5  < > 7.8  < > 4.2   Monocyte % 7.6  < > 4.0 5.3  < > 1.7  < > 1.7   Eosinophil % 3.0  < > 0.0 5.3  < > 0.9  < > 0.0   Basophil % 0.6  < > 0.0 0.0  < > 0.0  < > 0.0   Bands %  --   --   --  2  < > 1  < > 2   Myelocyte %  --   --  1*  --   < > 1*  < >  --    Metamyelocyte %  --   --  1 1  < > 2*  < > 1   Promyelocyte %  --   --   --   --   --  2*  --   --    Blasts %  --   --   --   --   --   --   --  1*   < > = values in this interval not displayed.      Recent Labs  Lab 05/23/17  0030 05/21/17  0420 05/18/17  2345   Sodium 132* 132* 133   Potassium 4.8 4.4 4.3   CO2 '25 27 26   ' UN '19 18 16   ' Creatinine 0.64 0.62 0.60   Glucose 89 105* 102*   Calcium 8.9 8.5* 8.6         Lab results: 05/23/17  0030 05/21/17  0420 05/18/17  2345 05/16/17  2350 05/16/17  0018   Total  Protein 7.7 6.6 7.0 6.6 6.7   Albumin 2.7* 2.3* 2.4* 2.5* 2.3*   ALT 78* 64* 78* 64* 70*   AST 84* 57* 70* 56* 71*   Alk Phos 568* 467* 569* 557* 606*   Bilirubin,Total 0.2 <0.2 0.2 0.2 0.2         Lab results: 05/22/17  0040   CK 71       Micro:   1/25: Intraoperative bile: GS 0 pmns, no organisms, Cx: no growth  2/12: 1 set of blood cultures from the right Mediport (no peripheral bld wase sent): positive for Enterobacter cloacae complex in 70 hrs, sensitive to zosyn.   2/18 abdominal GS had >25 PMNs, many GPC in pairs/chains, many GNB and cultures 4+ Enterobacter cloacae complex. The cultures also grew 1+ Candida glabrata   2/21: BCx from the Right peripheral IV, mediport, L IJ:  No  growth  05/03/17: IR-guided I&D with drain placement of the left upper quadrant collection: GS > 25 PMNs, many GNB and GN diplococci, Cultures with 4+ Enterobacter cloacae complex (resistant to Zosyn and CTX), C. Albicans and C. Glabrata, and Candida tropicalis  05/04/17:    1 set of blood cultures from left IJ: VRE at 15.3 TTP. Daptomycin sensitivities: MIC 49mg/ml (dose dependent)   1 set from periphery: VRE and Enterobacter cloacae at 14.8 TTP   05/05/17:  IR-guided I&D with drain placement of the intrahepatic collection: GS 1-10 PMNs, very few GPC in pairs. Fungal cultures with Candida glabrata  05/05/17: Blood cultures from periphery, mediport and IJ: no growth  05/04/17: Urine cultures obtained (for unclear reason): no growth  05/08/17: Abscess GS: >25 PMNs, many GPC in chains, few GNB. Aerobic cultures growing 2+ Enterobacter (ertapenem-sensitive) and 3+VRE, Fungal: C. glabrata-sensitivities below)        Imaging/Other Relevant Diagnostics:     05/23/17 CT abd/pelvis with contrast:   1. Again noted are several communicating multiloculated fluid collections in the left upper quadrant post percutaneous drainage removal. These collections have slightly increased in size compared with 05/14/2017.  2. Reaccumulation of the fluid collection along  the anterior margin of the left hepatic lobe, status post percutaneous drainage removal.  3. Slight interval decrease in size of an irregular fluid collection inferior right hepatic lobe. Other stable low-attenuation lesions unchanged in size.   4. Unchanged moderate left pleural effusion with left lower lobe atelectasis. Unchanged consolidation in the right lower lobe with bronchiectasis.  5. Unchanged periductal edema and focal fluid collection along the right internal/external biliary drain.      Assessment and Plan:  ID problem(s):   -Enterobacter cloacae bacteremia  -Multiple and extensive Intra-abdominal abscesses growing VRE, enterobacter and candida glabrata    89yoold female with pancreatic cancer, s/p Whipple procedure on 03/29/17 after neoadjuvant chemotherapy and radiation. Her post op course was c/b delayed gastric emptying, gastric distension and obstruction and a large surgical bed hematoma causing CBD and hepatic vein compression, severe biliary sepsis, s/p placement of a right hepatic duct external biliary drain placed on 2/12, found to have enterobacter cloacae bacteremia (2/12) presumed to be secondary to infection of the hematoma. However CT of abdomen (2/18) showed the hematoma had resolved but noted to have a well-circumscribed and larger anterior abdominal collection percutaneously, which was aspirated (2/19) and cultures grew enterobacter cloacae and Candida glabrata. ID service consulted on 2/21 with question of treating C.glabrata.  Caspofungin was started and a perc drain was placed LUQ 3/1, then anterior abdominal perc drain placed 3/3, and then anterior perc drain into left hepatic collection 05/08/17, the goal being source control with drain placements. She had been on Zosyn but her enterobacter became (amp-C inducible) thus switched to ertapenem and Daptomycin added on 05/04/17 for continued broad coverage.  Abdominal perc drains  In fluid collections were removed and biliary tube remains  (clamped). Caspofungin was switched to oral high-dose fluconazole (173mkg) daily once sensitivities returned on C.glabrata. Pt on methadone for pain control, QTc 409.     Plan was for at least 3 weeks of treatment from date of drain placements (05/08/17) with duration to be determined by repeat imaging.  Based on today's CT abd/pelvis, she continues to have several communicating multiloculated fluid collections which will need to be drained and antibiotics/antifungal duration extended until source control is achieved.  Discussed with covering provider (ACristie Hem  I told patient our recommendations for drainage, she was  naturally disappointed, happy to finally have abdominal drains removed.     Recommendations:   Intraabdominal fluid collections will need to be drained for source control, please obtain cultures of drainage.    Continue current regimen: IV Daptomycin (8 mg/kg) 500 mg daily, IV Ertapenem 1 gram daily, oral fluconazole 12 mg/kg (round up to 800 mg) daily, duration to be determined by resolution of intraabdominal fluid collections.   Follow CBC with diff, CMP, CRP/ESR, as well as CK while on Dapto   ID (Team 3) will follow    Thank you for allowing [name] to participate in the care of this patient  Please call with questions  .   Lonzo Candy, ANP-BC  Infectious Diseases, Team 3  Pager [phone]

## 2017-05-24 NOTE — Plan of Care (Signed)
Problem: Safety  Goal: Patient will remain free of falls  Outcome: Maintaining      Problem: Pain/Comfort  Goal: Patient's pain or discomfort is manageable  Outcome: Maintaining      Problem: Mobility  Goal: Functional status is maintained or improved - Geriatric  Outcome: Maintaining      Problem: Nutrition  Goal: Patient's nutritional status is maintained or improved  Outcome: Maintaining    Goal: Nutritional status is maintained or improved - Geriatric  Outcome: Maintaining      Problem: Bowel Elimination  Goal: Elimination patterns are normal or improving  Outcome: Maintaining      Problem: Post-Operative Hemodynamic Stability  Goal: Maintain Hemodynamic Stability  Outcome: Maintaining      Problem: Post-Operative Complications  Goal: Patient will remain free from symptoms of infection-post op  Outcome: Maintaining      Problem: Post-Operative Bowel Elimination  Goal: Elimination pattern is normal or improving  Outcome: Maintaining      Problem: Fluid and Electrolyte Imbalance  Goal: Fluid and Electrolyte imbalance  Outcome: Maintaining

## 2017-05-24 NOTE — Consults (Signed)
Medical Nutrition Therapy - Follow Up    Admit Date: 03/29/2017    Patient Summary: 72 y.o. female with h/o recent PE and pancreatic cancer status post whipple procedure complicated by delayed gastric emptying.  NGT replaced on 04/04/17. UGI on 04/12/17 concerning for obstruction just beyond the [place] in the efferent limb.  Course complicated on 5/57/32 by rising LFTs and sepsis with CT concerning for cholangitis given biliary tree obstruction and PV compression due to hematoma and afferent limb distention in setting of PE treatment. Is s/p IR PTC on 04/16/17 and IR percutaneous aspiration of anterior abdominal fluid collection on 04/24/17. IR LUQ perc drain placement 3/1. IR anterior abdominal 70f perc drain placement 3/3. IR anterior perc drain into left hepatic collection 3/6.     Pertinent Meds: levothyroxine, bowel regimen, Reglan, duloxetine, vitamin C, methadone, daptomycin, ertapenem, Protonix, Elavil, lantus, fluconazole, Mg sulfate   Pertinent Labs: Na 132 (L); Cl 92 (L); PO4 5.1 (H); 89-106 past 24 hours     Reviewed I/O's: BM x2, 970 ml PO     Enteral or parenteral access: NGT     Food allergies: NKFA    Current diet: Osmolite 1.0 @ 100 mL/hr for 16 hours. 30 mL H2O flush q 4 hrs.  Supplements: 30 ml ProSource BID, magic cup TID      Nutrition Focused Physical Exam:    Edema: +1 generalized, +1 abdominal, +2 BLE   Abdomen: rounded, +BS, loss of appetite, nausea   Skin: ecchymosis/bruising, surgical wound to abdomen, abrasion to L elbow     Anthropometrics:  Height: 170.2 cm ('5\' 7"'$ )    Current Weight: 79.8 kg (176 lb) (3/19); 110% IBW; 110% UBW  Ideal Body Weight: 72.4 kg + 10%  BMI: 27.5 kg/(m^2) overweight with edema noted   Weight Hx: ? 33 lb gain in 8 days, possibly due to fluid shift    05/21/2017 79.833 kg 176 lbs +1/+2 edema    05/13/2017 64.9 kg 143 lbs 1 oz Trace LE edema          Estimated Nutrient Needs: (Based on 64.9 kg)  *BW with minimal edema    1630-1950 kcal/day (25-30 kcal/kg)   78-97 g protein/day  (1.2-1.5 g/kg)    1630-1950 mL fluid/day (25-30 mL/kg) *or per team       Nutrition Assessment and Diagnosis:   RN reports pt getting too full with cycled TFs. Was wondering if the time could be reduced. Discussed TFs with pt, reminded her that she is getting more TFs because she is not taking much by mouth. She is not drinking the Ensure, when asked why, she reports "I am choosing not to". She is not drinking the magic cup because she dislikes it. Weight gain with increased edema and low Na noted. May benefit from more concentrated TF formula to reduce volume. Phos elevated, more concentrated TF wold provide less phos. TFs + prosource provide 1816 kcal, 100 g protein and 1524 ml free water daily. Intakes 25% of meals.     Malnutrition Status: Pt diagnosed with moderate malnutrition 04/09/17.      Nutrition Intervention:   1. Recommend Osmolite 1.5 @ 90 ml/hr x 12 hours with min FWF 30 ml Q4H. Continue 30 ml prosource BID. Will provide 1740 kcal, 98 g protein and 1001 ml free water daily. Adjust FWF prn based on po fluid intake and fluid status.  2. Can d/c magic cup, pt is not eating  3. Continue to monitor weekly weights, I/Os and %  po intake- appreciate RN assistance   4. Continue to monitor BMP, Mg and PO4. Replete as needed     Nutrition Monitoring/Evaluation:   1. Will monitor diet tolerance and intake, TF tolerance, nutrition-related labs, weight trend, BM pattern.   2. Will follow up per high nutrition risk protocol.    Laurence Aly, [place]  Pager [phone]

## 2017-05-24 NOTE — Progress Notes (Addendum)
Surgical Oncology/Hepatobiliary Surgery Progress Note     LOS: 56 days     Subjective:  Feeling well this morning. Took in 900 po yesterday. No nausea/vomiting. Passing gas, having bowel movements.      Objective:    Vitals Sign Ranges for Past 24 Hours:  BP: (108-120)/(60-80)   Temp:  [36.1 C (97 F)-37.1 C (98.8 F)]   Temp src: Temporal (03/22 0741)  Heart Rate:  [98-105]   Resp:  [16-20]   SpO2:  [92 %-95 %]       Physical Exam:    General Appearance: in NAD, speaking slowly/quietly  Cardiac: regular rate  Respiratory: non-labored breathing on room air  Abdomen: soft, moderately tender to palpation, distended, mild ecchymosis, biliary tube clamped  Extremities: warm    Labs:    CBC:    Recent Labs  Lab 05/23/17  0030 05/21/17  0420 05/18/17  2345   WBC 13.0* 9.5 9.2   Hemoglobin 8.3* 7.0* 7.3*   Hematocrit 26* 23* 23*   Platelets 638* 470* 026*       Metabolic Panel:    Recent Labs  Lab 05/23/17  0030 05/21/17  0420 05/18/17  2345   Sodium 132* 132* 133   Potassium 4.8 4.4 4.3   Chloride 92* 93* 93*   CO2 '25 27 26   ' UN '19 18 16   ' Creatinine 0.64 0.62 0.60   Glucose 89 105* 102*   Calcium 8.9 8.5* 8.6   Magnesium 1.8 1.7 1.9   Phosphorus 5.1* 4.9* 5.2*          Assessment:    72 y.o. female with h/o recent PE and pancreatic cancer POD # 56  status post whipple procedure complicated by delayed gastric emptying.  NGT replaced on 04/04/17. UGI on 04/12/17 concerning for obstruction just beyond the [place] in the efferent limb.  Course complicated on 3/78/58 by rising LFTs and sepsis with CT concerning for cholangitis given biliary tree obstruction and PV compression due to hematoma and afferent limb distention in setting of PE treatment. Is s/p IR PTC on 04/16/17 and IR percutaneous aspiration of anterior abdominal fluid collection on 04/24/17. IR LUQ perc drain placement 3/1. IR anterior abdominal 52f perc drain placement 3/3. IR anterior perc drain into left hepatic collection 3/6.    Plan:  - Regular diet + ensures  -  Continue to encourage PO/enteral nutrition, TFs to goal - now cycled at 16 hours   - Aggressive bowel regimen  - Increased WBCs. Will recheck CBC this AM.   - CT scan 3/21 with resolved intra-abd collections, stable left pleural effusion, new portal venous collection. Continue ertapenem/caspo/dapto until 3/27, appreciate ID recs  - Continue capped biliary drain  - SCDs, Lovenox for VTE ppx (hold therapeutic anticoagulation)   - Dispo: pending clinical course, IR drains    NCristie Hem MD  05/24/2017     7:56 AM   General Surgery Resident      I saw and evaluated the patient. I agree with the resident's/fellow's findings and plan of care as documented above.    JVladimir Creeks MD

## 2017-05-25 LAB — COMPREHENSIVE METABOLIC PANEL
ALT: 49 U/L — ABNORMAL HIGH (ref 0–35)
AST: 51 U/L — ABNORMAL HIGH (ref 0–35)
Albumin: 2.4 g/dL — ABNORMAL LOW (ref 3.5–5.2)
Alk Phos: 428 U/L — ABNORMAL HIGH (ref 35–105)
Anion Gap: 12 (ref 7–16)
Bilirubin,Total: 0.2 mg/dL (ref 0.0–1.2)
CO2: 26 mmol/L (ref 20–28)
Calcium: 8.8 mg/dL (ref 8.6–10.2)
Chloride: 95 mmol/L — ABNORMAL LOW (ref 96–108)
Creatinine: 0.67 mg/dL (ref 0.51–0.95)
GFR,Black: 102 *
GFR,Caucasian: 89 *
Glucose: 109 mg/dL — ABNORMAL HIGH (ref 60–99)
Lab: 19 mg/dL (ref 6–20)
Potassium: 4.5 mmol/L (ref 3.3–5.1)
Sodium: 133 mmol/L (ref 133–145)
Total Protein: 7.1 g/dL (ref 6.3–7.7)

## 2017-05-25 LAB — CBC AND DIFFERENTIAL
Baso # K/uL: 0.1 10*3/uL (ref 0.0–0.1)
Basophil %: 0.6 %
Eos # K/uL: 0.4 10*3/uL (ref 0.0–0.4)
Eosinophil %: 3.6 %
Hematocrit: 23 % — ABNORMAL LOW (ref 34–45)
Hemoglobin: 7 g/dL — ABNORMAL LOW (ref 11.2–15.7)
IMM Granulocytes #: 0.1 10*3/uL
IMM Granulocytes: 0.9 %
Lymph # K/uL: 1.3 10*3/uL (ref 1.2–3.7)
Lymphocyte %: 12.8 %
MCH: 28 pg/cell (ref 26–32)
MCHC: 31 g/dL — ABNORMAL LOW (ref 32–36)
MCV: 91 fL (ref 79–95)
Mono # K/uL: 0.8 10*3/uL (ref 0.2–0.9)
Monocyte %: 7.8 %
Neut # K/uL: 7.3 10*3/uL — ABNORMAL HIGH (ref 1.6–6.1)
Nucl RBC # K/uL: 0 10*3/uL (ref 0.0–0.0)
Nucl RBC %: 0 /100 WBC (ref 0.0–0.2)
Platelets: 528 10*3/uL — ABNORMAL HIGH (ref 160–370)
RBC: 2.5 MIL/uL — ABNORMAL LOW (ref 3.9–5.2)
RDW: 17.3 % — ABNORMAL HIGH (ref 11.7–14.4)
Seg Neut %: 74.3 %
WBC: 9.8 10*3/uL (ref 4.0–10.0)

## 2017-05-25 LAB — MAGNESIUM: Magnesium: 1.9 mg/dL (ref 1.6–2.5)

## 2017-05-25 LAB — PHOSPHORUS: Phosphorus: 5.1 mg/dL — ABNORMAL HIGH (ref 2.7–4.5)

## 2017-05-25 LAB — POCT GLUCOSE: Glucose POCT: 100 mg/dL — ABNORMAL HIGH (ref 60–99)

## 2017-05-25 NOTE — Progress Notes (Addendum)
Surgical Oncology/Hepatobiliary Surgery Progress Note     LOS: 57 days     Subjective:  In bed, resting comfortably. Took in 240 po yesterday. No nausea/vomiting. Passing gas, having bowel movements.      Objective:  Vitals Sign Ranges for Past 24 Hours:  BP: (104-124)/(60-82)   Temp:  [35.6 C (96.1 F)-37.1 C (98.8 F)]   Temp src: Temporal (03/23 0334)  Heart Rate:  [93-104]   Resp:  [16-20]   SpO2:  [91 %-100 %]       Physical Exam:  General Appearance: in NAD, speaking slowly/quietly  Cardiac: regular rate  Respiratory: non-labored breathing on room air  Abdomen: Soft, minimally tender to palpation, distended, mild ecchymosis, biliary tube clamped  Extremities: warm    Labs:    CBC:    Recent Labs  Lab 05/24/17  2350 05/24/17  0805 05/23/17  0030 05/21/17  0420 05/18/17  2345   WBC 9.8 9.8 13.0* 9.5 9.2   Hemoglobin 7.0* 7.3* 8.3* 7.0* 7.3*   Hematocrit 23* 23* 26* 23* 23*   Platelets 528* 533* 638* 470* 030*       Metabolic Panel:    Recent Labs  Lab 05/24/17  2350 05/23/17  0030 05/21/17  0420 05/18/17  2345   Sodium 133 132* 132* 133   Potassium 4.5 4.8 4.4 4.3   Chloride 95* 92* 93* 93*   CO2 _0 UN _1 Creatinine 0.67 0.64 0.62 0.60   Glucose 109* 89 105* 102*   Calcium 8.8 8.9 8.5* 8.6   Magnesium 1.9 1.8 1.7 1.9   Phosphorus 5.1* 5.1* 4.9* 5.2*          Assessment:    72 y.o. female with h/o recent PE and pancreatic cancer POD # 66  status post whipple procedure complicated by delayed gastric emptying.  NGT replaced on 04/04/17. UGI on 04/12/17 concerning for obstruction just beyond the [place] in the efferent limb.  Course complicated on 0/92/33 by rising LFTs and sepsis with CT concerning for cholangitis given biliary tree obstruction and PV compression due to hematoma and afferent limb distention in setting of PE treatment. Is s/p IR PTC on 04/16/17 and IR percutaneous aspiration of anterior abdominal fluid collection on 04/24/17. IR LUQ perc drain placement 3/1. IR anterior abdominal 9f  perc drain placement 3/3. IR anterior perc drain into left hepatic collection 3/6.    Plan:  - Regular diet + ensures  - Continue to encourage PO/enteral nutrition, TFs to goal - now cycled at 12 hours   - Aggressive bowel regimen  - CT scan 3/21 with intra-abd collections, new portal venous collection. However, patient remains afebrile, WBCs normal, do not plan to drain at this time. Continue ertapenem/caspo/dapto, appreciate ID recs.  - Continue capped biliary drain  - SCDs, Lovenox for VTE ppx (hold therapeutic anticoagulation)   - Dispo: Pending clinical course, appropriate PO intake    NCristie Hem MD  05/25/2017     7:28 AM   General Surgery Resident    I saw and evaluated the patient. I agree with the resident's/fellow's findings and plan of care as documented above.    JVladimir Creeks MD

## 2017-05-26 ENCOUNTER — Inpatient Hospital Stay: Payer: Medicare (Managed Care)

## 2017-05-26 LAB — FUNGUS CULTURE

## 2017-05-26 LAB — CBC AND DIFFERENTIAL
Baso # K/uL: 0.1 10*3/uL (ref 0.0–0.1)
Basophil %: 0.7 %
Eos # K/uL: 0.4 10*3/uL (ref 0.0–0.4)
Eosinophil %: 3.1 %
Hematocrit: 23 % — ABNORMAL LOW (ref 34–45)
Hemoglobin: 7.3 g/dL — ABNORMAL LOW (ref 11.2–15.7)
IMM Granulocytes #: 0.1 10*3/uL
IMM Granulocytes: 1 %
Lymph # K/uL: 1.4 10*3/uL (ref 1.2–3.7)
Lymphocyte %: 12.8 %
MCH: 29 pg/cell (ref 26–32)
MCHC: 32 g/dL (ref 32–36)
MCV: 92 fL (ref 79–95)
Mono # K/uL: 0.8 10*3/uL (ref 0.2–0.9)
Monocyte %: 7.1 %
Neut # K/uL: 8.4 10*3/uL — ABNORMAL HIGH (ref 1.6–6.1)
Nucl RBC # K/uL: 0 10*3/uL (ref 0.0–0.0)
Nucl RBC %: 0.1 /100 WBC (ref 0.0–0.2)
Platelets: 534 10*3/uL — ABNORMAL HIGH (ref 160–370)
RBC: 2.5 MIL/uL — ABNORMAL LOW (ref 3.9–5.2)
RDW: 17.3 % — ABNORMAL HIGH (ref 11.7–14.4)
Seg Neut %: 75.3 %
WBC: 11.2 10*3/uL — ABNORMAL HIGH (ref 4.0–10.0)

## 2017-05-26 LAB — POCT GLUCOSE: Glucose POCT: 120 mg/dL — ABNORMAL HIGH (ref 60–99)

## 2017-05-26 MED ORDER — HYDROMORPHONE HCL 2 MG PO TABS *I*
2.0000 mg | ORAL_TABLET | ORAL | Status: DC | PRN
Start: 2017-05-26 — End: 2017-05-27

## 2017-05-26 MED ORDER — HYDROMORPHONE HCL 2 MG/ML IJ SOLN *WRAPPED*
0.5000 mg | INTRAMUSCULAR | Status: DC | PRN
Start: 2017-05-26 — End: 2017-05-26

## 2017-05-26 MED ORDER — HYDROMORPHONE HCL 2 MG/ML IJ SOLN *WRAPPED*
0.5000 mg | INTRAMUSCULAR | Status: DC | PRN
Start: 2017-05-26 — End: 2017-05-27
  Administered 2017-05-26: 0.5 mg via INTRAVENOUS
  Filled 2017-05-26: qty 1

## 2017-05-26 MED ORDER — METHADONE HCL 5 MG PO TABS *I*
7.5000 mg | ORAL_TABLET | Freq: Two times a day (BID) | ORAL | Status: DC
Start: 2017-05-26 — End: 2017-05-31
  Administered 2017-05-26 – 2017-05-30 (×9): 7.5 mg via ORAL
  Filled 2017-05-26 (×10): qty 2

## 2017-05-26 MED ORDER — MAGNESIUM SULFATE 2 GM IN 50 ML *WRAPPED*
2000.0000 mg | Freq: Once | INTRAVENOUS | Status: AC
Start: 2017-05-26 — End: 2017-05-26
  Administered 2017-05-26: 2000 mg via INTRAVENOUS
  Filled 2017-05-26: qty 50

## 2017-05-26 MED ORDER — HYDROMORPHONE HCL 2 MG PO TABS *I*
1.0000 mg | ORAL_TABLET | ORAL | Status: DC | PRN
Start: 2017-05-26 — End: 2017-05-27

## 2017-05-26 MED ORDER — METHADONE HCL 5 MG PO TABS *I*
5.0000 mg | ORAL_TABLET | Freq: Every day | ORAL | Status: AC
Start: 2017-05-27 — End: 2017-06-03
  Administered 2017-05-27 – 2017-06-02 (×6): 5 mg via ORAL
  Filled 2017-05-26 (×7): qty 1

## 2017-05-26 MED ORDER — ALTEPLASE 2 MG IJ SOLR *I*
0.5000 mg | Freq: Once | INTRAMUSCULAR | Status: AC
Start: 2017-05-26 — End: 2017-05-26
  Administered 2017-05-26: 1.5 mg
  Filled 2017-05-26: qty 2

## 2017-05-26 NOTE — Progress Notes (Signed)
Nursing Progress Note:    Assumed care from 15:00 to 19:00. No acute events this shift. VSS. Pt's daughter present at bedside and notified RN that pt's abdomen appeared more distended to her. Upon assessment, RN noted that pt's upper abdomen was distended and tender to touch, lower abdomen soft and non tender. Fredderick Severance, NP notified and provider arrived at bedside to assess. No new orders at current time. Pt with no further issues or complaints at this time, currently resting in chair, call light within reach, daughter at bedside. Please refer to flowsheets for complete assessment findings. Report given to Cammie Mcgee, RN, will continue to monitor.    Ashley-Marie Ellina Sivertsen, RN

## 2017-05-26 NOTE — Plan of Care (Signed)
Problem: Safety  Goal: Patient will remain free of falls  Outcome: Progressing towards goal  No falls this shift.    Problem: Pain/Comfort  Goal: Patient's pain or discomfort is manageable  Outcome: Progressing towards goal  Pain controlled on current regimen.    Problem: Mobility  Goal: Functional status is maintained or improved - Geriatric  Outcome: Maintaining  Pt refused ambulating in the hallway when offered, stating "she tired me out earlier", referring to ambulating with previous nurse at change of shift. Pt did ambulate to restroom this shift and was OOB to chair for majority of the shift.     Problem: Nutrition  Goal: Nutritional status is maintained or improved - Geriatric  Outcome: Progressing towards goal  Pt ate approximately 25% of her dinner tray and tolerated well with no c/o nausea or vomiting. Pt also receiving scheduled Osmolite 1.5 tube feed at 90 mL/hr infusing over 12 hours. Tube feed initiated at 20:00 and pt has tolerated well thus far with no c/o nausea or vomiting. Pt did c/o heartburn but declined PRN TUMS. Pt is receiving scheduled Protonix. Pt daughter asked if Pepcid could be added to pt's MAR. Vaughan Browner, NP notified and to follow up with request with the primary team in the morning.    Problem: Bowel Elimination  Goal: Elimination patterns are normal or improving  Outcome: Maintaining  Pt did not have a bowel movement this shift, however she did have one this morning on day shift per report. Pt refused evening enema but was agreeable to taking all of her scheduled bowel medications.    Problem: Post-Operative Hemodynamic Stability  Goal: Maintain Hemodynamic Stability  Outcome: Progressing towards goal      Problem: Post-Operative Complications  Goal: Patient will remain free from symptoms of infection-post op  Outcome: Progressing towards goal    Goal: Prevent post-operative complications  Outcome: Progressing towards goal      Problem: Psychosocial  Goal: Demonstrates ability  to cope with illness  Outcome: Maintaining      Problem: Fluid and Electrolyte Imbalance  Goal: Fluid and Electrolyte imbalance  Outcome: Progressing towards goal

## 2017-05-26 NOTE — Progress Notes (Signed)
Brief Progress Note    Called to bedside to assess patient s/p fall.    Upon arrival, patient was resting comfortably in chair, in no acute distress. She described the fall as "sitting sideways on the toilet" from a standing position after she felt dizzy. She denies LOC or head injury. She did hit her right elbow.    Per Benjamine Mola (RN): she accompanied the patient to the bathroom and had assisted her in clean up, then left the patient momentarily unattended in the bathroom in order to obtain a pair of gloves- this is when the event occurred. The event was therefore unwitnessed, so a neurologic exam was performed.    On physical exam, patient overall at baseline with neurologic exam- AOx3, no obvious FND, strength/motor equal bilaterally. Patient's right elbow with 0.5cm skin tear, not actively bleeding, covered with Allevyn. Vitals signs below:    Last Filed Vitals    05/26/17 0915   BP: 138/62   Pulse: (!) 118   Resp:    Temp: 36.8 C (98.2 F)   SpO2: 98%     Assessment/Plan:  Patient is a 72 yo female with complex medical history well known to the service now s/p unwitnessed fall from standing to seated position -LOC -head injury. Patient's dizziness has resolved, and she is baseline tachycardic.    - No further interventions required  - Keep right elbow wound well dressed, check for maceration with Allevyn  - Patient requires continuous nursing assistance when ambulating/in bathroom, avoid leaving patient unsupervised    Cristie Hem, MD 05/26/2017 9:41 AM

## 2017-05-26 NOTE — Progress Notes (Signed)
Nursing Progress Note:    Assumed care at 15:30. No acute events this shift. A&O x3, VSS. Upon assessment, pt's doboff tube was noted to be secured at 50 cm, previously documented to be secured at 73 cm. Mitzie Na, MD notified. New orders placed for KUB to confirm placement. Orders placed indicating that doboff was okay to use. Pt with no further issues or complaints at this time, currently resting in bed, call light within reach. Please refer to flowsheets for complete assessment findings and Plan of Care note for further information. Report given to Pennie Banter, RN, will continue to monitor.    Ashley-Marie L[place], RN

## 2017-05-26 NOTE — Progress Notes (Signed)
Pt fell in bathroom; more of a hard sit down on toilet. Pt tachycardic at 117, other VSS. Small skin tear to R elbow; cleansed with NS & 3x3 Allevyn applied. Pts feels they bumped left upper back (no visible wounds/bruising). Amanda Ferguson, MD notified about fall at [phone], and now assessing pt. Will continue to monitor & assess.  Brion Aliment, RN

## 2017-05-26 NOTE — Progress Notes (Addendum)
Surgical Oncology/Hepatobiliary Surgery Progress Note     LOS: 58 days     Subjective:  In bed, resting comfortably. Took in only 120 po yesterday. No nausea/vomiting. Passing gas, having bowel movements.      Objective:  Vitals Sign Ranges for Past 24 Hours:  BP: (102-110)/(56-70)   Temp:  [36.1 C (97 F)-36.6 C (97.9 F)]   Temp src: Temporal (03/24 0437)  Heart Rate:  [99-110]   Resp:  [16]   SpO2:  [91 %-98 %]       Physical Exam:  General Appearance: in NAD, speaking slowly/quietly  Cardiac: regular rate  Respiratory: non-labored breathing on room air  Abdomen: Soft, minimally tender to palpation, distended, mild ecchymosis, biliary tube clamped  Extremities: warm    Labs:    CBC:    Recent Labs  Lab 05/26/17  0002 05/24/17  2350 05/24/17  0805 05/23/17  0030 05/21/17  0420   WBC 11.2* 9.8 9.8 13.0* 9.5   Hemoglobin 7.3* 7.0* 7.3* 8.3* 7.0*   Hematocrit 23* 23* 23* 26* 23*   Platelets 534* 528* 533* 638* 726*       Metabolic Panel:    Recent Labs  Lab 05/24/17  2350 05/23/17  0030 05/21/17  0420   Sodium 133 132* 132*   Potassium 4.5 4.8 4.4   Chloride 95* 92* 93*   CO2 '26 25 27   ' UN '19 19 18   ' Creatinine 0.67 0.64 0.62   Glucose 109* 89 105*   Calcium 8.8 8.9 8.5*   Magnesium 1.9 1.8 1.7   Phosphorus 5.1* 5.1* 4.9*          Assessment:    72 y.o. female with h/o recent PE and pancreatic cancer POD # 1  status post whipple procedure complicated by delayed gastric emptying.  NGT replaced on 04/04/17. UGI on 04/12/17 concerning for obstruction just beyond the [place] in the efferent limb.  Course complicated on 04/07/53 by rising LFTs and sepsis with CT concerning for cholangitis given biliary tree obstruction and PV compression due to hematoma and afferent limb distention in setting of PE treatment. Is s/p IR PTC on 04/16/17 and IR percutaneous aspiration of anterior abdominal fluid collection on 04/24/17. IR LUQ perc drain placement 3/1. IR anterior abdominal 33f perc drain placement 3/3. IR anterior perc drain into  left hepatic collection 3/6.    Plan:  - Regular diet + ensures  - Continue to encourage PO/enteral nutrition, TFs to goal - now cycled at 12 hours   - Aggressive bowel regimen  - CT scan 3/21 with intra-abd collections, new portal venous collection. However, patient remains afebrile, clinically stable, so do not plan to drain at this time. Continue ertapenem/caspo/dapto, appreciate ID recs.  - Continue capped biliary drain  - SCDs, Lovenox for VTE ppx (hold therapeutic anticoagulation)   - Dispo: Pending clinical course, adequate nutrition/PO intake    NCristie Hem MD  05/26/2017     6:24 AM   General Surgery Resident        I saw and evaluated the patient. I agree with the resident's/fellow's findings and plan of care as documented above. Cont current supportive care.     JVladimir Creeks MD

## 2017-05-27 ENCOUNTER — Inpatient Hospital Stay: Payer: Medicare (Managed Care)

## 2017-05-27 DIAGNOSIS — R638 Other symptoms and signs concerning food and fluid intake: Secondary | ICD-10-CM

## 2017-05-27 LAB — CBC AND DIFFERENTIAL
Baso # K/uL: 0.1 10*3/uL (ref 0.0–0.1)
Basophil %: 0.7 %
Eos # K/uL: 0.4 10*3/uL (ref 0.0–0.4)
Eosinophil %: 3.7 %
Hematocrit: 23 % — ABNORMAL LOW (ref 34–45)
Hemoglobin: 6.9 g/dL — ABNORMAL LOW (ref 11.2–15.7)
IMM Granulocytes #: 0.1 10*3/uL
IMM Granulocytes: 1 %
Lymph # K/uL: 1.4 10*3/uL (ref 1.2–3.7)
Lymphocyte %: 13 %
MCH: 28 pg/cell (ref 26–32)
MCHC: 30 g/dL — ABNORMAL LOW (ref 32–36)
MCV: 94 fL (ref 79–95)
Mono # K/uL: 0.9 10*3/uL (ref 0.2–0.9)
Monocyte %: 8.5 %
Neut # K/uL: 7.9 10*3/uL — ABNORMAL HIGH (ref 1.6–6.1)
Nucl RBC # K/uL: 0 10*3/uL (ref 0.0–0.0)
Nucl RBC %: 0.1 /100 WBC (ref 0.0–0.2)
Platelets: 526 10*3/uL — ABNORMAL HIGH (ref 160–370)
RBC: 2.4 MIL/uL — ABNORMAL LOW (ref 3.9–5.2)
RDW: 17.4 % — ABNORMAL HIGH (ref 11.7–14.4)
Seg Neut %: 73.1 %
WBC: 10.9 10*3/uL — ABNORMAL HIGH (ref 4.0–10.0)

## 2017-05-27 LAB — TRIGLYCERIDES: Triglycerides: 196 mg/dL — AB

## 2017-05-27 LAB — COMPREHENSIVE METABOLIC PANEL
ALT: 47 U/L — ABNORMAL HIGH (ref 0–35)
AST: 56 U/L — ABNORMAL HIGH (ref 0–35)
Albumin: 2.4 g/dL — ABNORMAL LOW (ref 3.5–5.2)
Alk Phos: 396 U/L — ABNORMAL HIGH (ref 35–105)
Anion Gap: 12 (ref 7–16)
Bilirubin,Total: <0.2 mg/dL (ref 0.0–1.2)
CO2: 27 mmol/L (ref 20–28)
Calcium: 8.6 mg/dL (ref 8.6–10.2)
Chloride: 97 mmol/L (ref 96–108)
Creatinine: 0.65 mg/dL (ref 0.51–0.95)
GFR,Black: 103 *
GFR,Caucasian: 90 *
Glucose: 117 mg/dL — ABNORMAL HIGH (ref 60–99)
Lab: 20 mg/dL (ref 6–20)
Potassium: 4.2 mmol/L (ref 3.3–5.1)
Sodium: 136 mmol/L (ref 133–145)
Total Protein: 7.1 g/dL (ref 6.3–7.7)

## 2017-05-27 LAB — PROTIME-INR
INR: 1.4 — ABNORMAL HIGH (ref 0.9–1.1)
Protime: 15.3 s — ABNORMAL HIGH (ref 10.0–12.9)

## 2017-05-27 LAB — PHOSPHORUS: Phosphorus: 4.8 mg/dL — ABNORMAL HIGH (ref 2.7–4.5)

## 2017-05-27 LAB — MAGNESIUM: Magnesium: 2 mg/dL (ref 1.6–2.5)

## 2017-05-27 LAB — PREALBUMIN: Prealbumin: 13 mg/dL — ABNORMAL LOW (ref 20–40)

## 2017-05-27 LAB — POCT GLUCOSE: Glucose POCT: 103 mg/dL — ABNORMAL HIGH (ref 60–99)

## 2017-05-27 MED ORDER — BARIUM SULFATE (E-Z-PAQUE) 60 % PO SUSP *I*
100.0000 mL | Freq: Once | ORAL | Status: AC
Start: 2017-05-27 — End: 2017-05-27
  Administered 2017-05-27: 100 mL via NASOGASTRIC

## 2017-05-27 MED ORDER — SODIUM CHLORIDE 0.9 % IV SOLN WRAPPED *I*
8.0000 mg/kg | INTRAVENOUS | Status: DC
Start: 2017-05-28 — End: 2017-05-30
  Administered 2017-05-28 – 2017-05-29 (×2): 500 mg via INTRAVENOUS
  Filled 2017-05-27 (×3): qty 10

## 2017-05-27 MED ORDER — INSULIN GLARGINE 100 UNIT/ML SC SOLN *WRAPPED*
3.0000 [IU] | Freq: Once | SUBCUTANEOUS | Status: AC
Start: 2017-05-27 — End: 2017-05-27
  Administered 2017-05-27: 3 [IU] via SUBCUTANEOUS

## 2017-05-27 MED ORDER — LACTATED RINGERS IV SOLN *I*
75.0000 mL/h | INTRAVENOUS | Status: DC
Start: 2017-05-28 — End: 2017-05-31
  Administered 2017-05-27: 75 mL/h via INTRAVENOUS
  Administered 2017-05-27 (×2): 75 mL/h
  Administered 2017-05-28: 75 mL/h via INTRAVENOUS
  Administered 2017-05-28 (×2): 75 mL/h
  Administered 2017-05-28: 75 mL/h via INTRAVENOUS
  Administered 2017-05-29 (×7): 75 mL/h
  Administered 2017-05-29: 75 mL/h via INTRAVENOUS
  Administered 2017-05-29 (×2): 75 mL/h
  Administered 2017-05-29: 75 mL/h via INTRAVENOUS
  Administered 2017-05-29 – 2017-05-30 (×3): 75 mL/h
  Administered 2017-05-30: 75 mL/h via INTRAVENOUS
  Administered 2017-05-30: 75 mL/h
  Administered 2017-05-30: 75 mL/h via INTRAVENOUS
  Administered 2017-05-31 (×3): 75 mL/h

## 2017-05-27 MED ORDER — HYDROMORPHONE HCL 2 MG PO TABS *I*
2.0000 mg | ORAL_TABLET | ORAL | Status: DC | PRN
Start: 2017-05-27 — End: 2017-06-14
  Administered 2017-05-27 – 2017-06-14 (×39): 2 mg via ORAL
  Filled 2017-05-27 (×39): qty 1

## 2017-05-27 MED ORDER — METHYLPHENIDATE HCL 5 MG PO TABS *I*
2.5000 mg | ORAL_TABLET | Freq: Two times a day (BID) | ORAL | Status: DC
Start: 2017-05-28 — End: 2017-06-09
  Administered 2017-05-29 – 2017-06-09 (×24): 2.5 mg via ORAL
  Filled 2017-05-27 (×24): qty 1

## 2017-05-27 NOTE — Progress Notes (Signed)
Palliative Care Progress Note    HPI: [name] w/ PMH fibromyalgia, pancreatic CA s/p Whipple for 'cure' 03/29/17; s/p ICU w/ intub for resp failure & septic shock, weaned from pressors and extubated to [facility]NC 2/17 w/ IR drainage bili fluid collection 2/19, ID following & receiving IV abx (caspo ends 3/2). Palliative following for symptoms, started methadone 2/19, weaned dose 2/23 d/t concern for sedation. S/p significant constipation requiring bowel clean out (s/p enema & methylnaltrexone). Unfortunately course complicated by intraabdominal abscesses, s/p IR for placement of perc drains & currently continues on IV abx; s/p TPN w/ poor po intake. Trial ritalin 1x/d on 3/7 w/ fair effect, much better result noted 3/8 w/ 2x/d dosing, more interactive when ritalin restarted 3/21. Pt w/ ongoing poor po intake, now tolerating full cyclic fds via dubhoff fdg tube. Goal dispo SNF Rehab.    Subjective: "My pain is ok, the water tastes like the woods. I'm craving a diet pepsi or a diet mountain dew. My daughter is having surgery tomorrow at [place], she'll be fine but I worry because she doesn't react well to the anesthesia. I'm hallucinating because I thought I was holding a cup of water and I went to put it down and realize now I'm not holding it"    Objective: chart reviewed, pt w/ fall in [place] on 3/24 & R elbow abrasion, she states she wasn't confused at the time and didn't get light headed but just felt weak and didn't have her walker in [place] to hold onto; visited pt this afternoon, she was in good spirits, sitting up in recliner, awake and interactive but still not much of an appetite; Dr Ron Agee and team visited and discussed w/ pt plan for removal of L neck CVC, bili tube and placement of PEG in IR; reiterated to pt that her best chances or recovery to return to independent state is to optimize her nutritional status, reviewed that once PEG in she would have dubhoff removed and continue w/ cyclic TFs allowing time during day  to eat/drink via oral route; pt had been hoping to avoid PEG but acknowledges that she is far off from goal of taking adeq calories orally so is agreeable to PEG placement    Pt reporting mild hallucinations and confusion, thinking she holding a cup of water or thinking her son is due to visit etc. Pt's symptoms are mild and she is aware of the confusion, reviewed past history w/ more extreme confusion after starting marinol. At that time both marinol and ritalin were stopped. Since, ritalin has been restarted and pt has not had significant confusion etc. Discussed doubtful that low dose ritalin would be appetite suppressant any more than low dose caffeine and we've seen positive effect w/ pt's alertness and mood since starting ritalin.     Spoke w/ [name] [phone]) to review pt's med list especially drug to drug interactions - waiting for call back    Palliative Care ROS:  Pain   Mild  Nausea   None  Anorexia   Moderate  Anxiety   Mild  Depression   Mild  Shortness of Breath   None  Tiredness/Fatigue   Mild  Drowsiness/Sleepiness   Mild  Airway Secretions   no  Constipation   no  Unable to Respond   no  Delirium   no   Last stool 3/25    Physical Examination:   BP: (108-120)/(60-80)   Temp:  [36.3 C (97.3 F)-36.7 C (98.1 F)]  Temp src: Temporal (03/25 0900)  Heart Rate:  [97-104]   Resp:  [16-17]   SpO2:  [90 %-95 %]   Gen appearance: [name], sitting up in recliner, no acute distress, in good spirits, asking for diet pepsi  Lungs: easy resp, RA  Heart: reg  Abd: large, distended, L Bili tube clamped & plan for it to be removed soon, stool today  Extrem: mod LE edema  Skin: pale, warm, dry  Neuro:  A+O x 3, at baseline ambulates short distances w/ walker, OOB to chair stand & pivot w/ walker; pt requires standby assist (h/o recent fall in [place] when didn't have walker for support)    Assessment/Plan: [name] w/ pancreatic CA, s/p Whipple in Jan. s/p TPN, s/p perc drains now removed & cont's  on IV abx for resolving abd abscesses. Pt encouraged to take PO w/ poor appetite though is tolerating cyclic TFs. 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    Pain/dyspnea  Acetaminophen 1 gm po TID  Methadone 5/7.5/7.5 mg po TID (total 20 mg/d, last increased 3/11)  Dilaudid 0.5 mg IV Q 1 hr prn (x 1 on 3/24 received after fall in [place]) consider stopping Dilaudid IV route, switch to only 2 mg po Q 4 hrs prn   Lidocaine patch daily  Encourage incentive spirometry prn     Anxiety/agitation/nausea/fatigue  Amitriptyline 25 mg po QHS  Duloxetine 20 mg po daily  Reglan 10 mg po 4x/d (AC & QHS)  Pantoprazole 40 mg po BID  Calcium carbonate 1 gm po BID prn (x 1 on 3/25)  Lorazepam 0.5 mg po Q 6 hrs prn (last x 1 on 3/7 QHS)  Ondansetron 4 mg IV Q 6 hrs prn (last x 1 on 3/10)  Ritalin 5 mg po 2x/d at 8 am and 12 pm (restarted 3/21 w/ + effect) unlikely ritalin is cause of mild confusion or appetite suppression, if team feels strongly about cutting back would consider reduction to 5 mg at 8 am and 2.5 mg at noon as she did not have good response on only 5 mg/d    Prevention of constipation, last stool 3/25  Colace 200 mg po BID  Miralax 17 gm po BID  Senna 2 tab po BID  Bisacodyl supp pr daily     GOC/prognosis  Full code  Dtr Amanda Galloway is HCP  Dispo plans now for SNF Rehab (before dispo pt will need to either be taking enough PO or will need PEG fdg tube)    Pt case discussed w/ RN, Dr Jinny Blossom [facility] and covering providers. At time of note, still waiting to hear back from [facility] if any med interactions possibly contributing to pt's mild confusion -will plan to relay any helpful info received from [facility] to primary team.     We will continue to follow along, please call if questions/concerns.    Total Time Spent 45 minutes: >50% of time was spent in counseling and/or coordination of care.     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    Hypothyroidism E03.9    Pancreatic cancer C25.9    Anemia D64.9     hx of Pulmonary embolism I26.99  Depression F32.9    Sepsis A41.9       No Known Allergies (drug, envir, food or latex)    Scheduled Meds:    methadone  7.5 mg Oral BID    methadone  5 mg Oral Daily    methylphenidate  5 mg Oral BID    fluconazole  800 mg Oral Daily    insulin glargine  6 Units Subcutaneous Nightly    pantoprazole  40 mg Oral BID AC    amitriptyline  25 mg Oral Nightly    PROSOURCE NO CARB  30 mL Oral BID WC    daptomycin IV  8 mg/kg Intravenous Q24H    ertapenem  1,000 mg Intravenous Q24H    senna  2 tablet Oral 2 times per day    bisacodyl  10 mg Rectal Daily    enoxaparin  40 mg Subcutaneous Daily    DULoxetine  20 mg Oral Daily    metoclopramide  10 mg Oral 4x Daily AC & HS    lidocaine  1 patch Transdermal Q24H    docusate sodium  200 mg Oral 2 times per day    polyethylene glycol  17 g Oral 2 times per day    acetaminophen  1,000 mg Oral Q8H    levothyroxine  137 mcg Oral Daily       Continuous Infusions:       PRN Meds:  HYDROmorphone **OR** HYDROmorphone, HYDROmorphone PF, naloxone, sodium chloride, dextrose, calcium carbonate, heparin lock flush, ondansetron

## 2017-05-27 NOTE — Progress Notes (Signed)
Procedure Report: Central Line Removal      Type of Line:  PCVC Triple Lumen (percutaneous) 04/17/17     Location of Line: Left Internal jugular    Procedure Details   Procedure explained to patient. Patient placed in trendelenburg. Two sutures connecting line to skin were removed with a suture removal kit. Patient was instructed to "breathe out." As patient breathed out the central line was removed. No resistance occurred during removal. The area was inspected and without redness or drainage. A 4x4 gauze was held for 5 minutes. After the 5 minutes a 4x4 gauze, vaseline dressing and covering Tegaderm.     Complications: none    Patient Condition: good     Plan/Orders Change dressing as needed, especially if saturated or compromised.

## 2017-05-27 NOTE — Progress Notes (Addendum)
Infectious Diseases (Team 3) Follow Up Note      Personally reviewed chart, labs, medications  Patient seen.     CC:  F/U Enterobacter cloacae bacteremia and Intra-abdominal abscesses growing VRE, enterobacter and candida glabrata      Subjective:  Sitting in chair, denies F/C, N/V/D; she is having bowel movements, continues to struggle with po intake, she is having abdominal pain today with any position change or deep breaths.      ROS: Reviewed 6 systems in detail, see above    Current Meds:  Scheduled Meds:   Barium Sulfate  100 mL Per NG tube Once    [START ON 05/28/2017] daptomycin IV  8 mg/kg Intravenous Q24H    [START ON 05/28/2017] methylphenidate  2.5 mg Oral BID    methadone  7.5 mg Oral BID    methadone  5 mg Oral Daily    fluconazole  800 mg Oral Daily    insulin glargine  6 Units Subcutaneous Nightly    pantoprazole  40 mg Oral BID AC    amitriptyline  25 mg Oral Nightly    PROSOURCE NO CARB  30 mL Oral BID WC    ertapenem  1,000 mg Intravenous Q24H    senna  2 tablet Oral 2 times per day    bisacodyl  10 mg Rectal Daily    enoxaparin  40 mg Subcutaneous Daily    DULoxetine  20 mg Oral Daily    metoclopramide  10 mg Oral 4x Daily AC & HS    lidocaine  1 patch Transdermal Q24H    docusate sodium  200 mg Oral 2 times per day    polyethylene glycol  17 g Oral 2 times per day    acetaminophen  1,000 mg Oral Q8H    levothyroxine  137 mcg Oral Daily     Continuous Infusions:    PRN Meds:.   calcium carbonate  1,000 mg Oral BID PRN    HYDROmorphone PF  0.5 mg Intravenous Q1H PRN    Or    HYDROmorphone PF  1 mg Intravenous Q1H PRN    LORazepam  0.5 mg Oral Q6H PRN    heparin lock flush  50 Units Intracatheter PRN    ondansetron  4 mg Intravenous Q6H PRN       Objective:    BP: (100-120)/(56-80)   Temp:  [36.3 C (97.3 F)-36.7 C (98.1 F)]   Temp src: Temporal (03/25 1628)  Heart Rate:  [97-104]   Resp:  [16-17]   SpO2:  [90 %-95 %]     General Appearance:  Grimacing during exam d/t  abdominal pain.   HEENT: MMM, oropharynx clear  Pulm: diminished bibasilar  CV: RRR, Normal S1S2, no M/R/G  Abdomen:  Firm upper quads, remains distended, TTP throughout, hypoactive bowel sounds  Extremities: MAE weakly  Skin:Warm and dry to touch; no rashes  Neuro: alert and oriented x4, speech slow, soft voice, no obvious focal deficits  Lines/Drains/Tubes: LIJ TCVC 2/13 (site benign), right chest wall port (not accessed), RUQ biliary drain clamped, insertion site benign.         Lab results: 05/27/17  0019  05/11/17  0045 05/10/17  0042  04/23/17  2343  04/21/17  2348   WBC 10.9*  < > 11.2* 11.1*  < > 18.8*  < > 15.5*   Hemoglobin 6.9*  < > 7.9* 7.9*  < > 8.9*   9.4*  < > 8.7*   Hematocrit 23*  < >  25* 25*  < > 27*  < > 26*   RBC 2.4*  < > 2.7* 2.7*  < > 3.0*  < > 3.0*   Platelets 526*  < > 513* 517*  < > 340  < > 353   Neut # K/uL 7.9*  < > 9.6* 8.6*  < > 16.0*  < > 14.4*   Lymph # K/uL 1.4  < > 0.9* 1.3  < > 1.5  < > 0.6*   Mono # K/uL 0.9  < > 0.5 0.6  < > 0.3  < > 0.3   Eos # K/uL 0.4  < > 0.0 0.6*  < > 0.2  < > 0.0   Baso # K/uL 0.1  < > 0.0 0.0  < > 0.0  < > 0.0   Seg Neut % 73.1  < > 86.0 75.3  < > 84.4  < > 90.8   Lymphocyte % 13.0  < > 7.0 10.5  < > 7.8  < > 4.2   Monocyte % 8.5  < > 4.0 5.3  < > 1.7  < > 1.7   Eosinophil % 3.7  < > 0.0 5.3  < > 0.9  < > 0.0   Basophil % 0.7  < > 0.0 0.0  < > 0.0  < > 0.0   Bands %  --   --   --  2  < > 1  < > 2   Myelocyte %  --   --  1*  --   < > 1*  < >  --    Metamyelocyte %  --   --  1 1  < > 2*  < > 1   Promyelocyte %  --   --   --   --   --  2*  --   --    Blasts %  --   --   --   --   --   --   --  1*   < > = values in this interval not displayed.      Recent Labs  Lab 05/27/17  0019 05/24/17  2350 05/23/17  0030   Sodium 136 133 132*   Potassium 4.2 4.5 4.8   CO2 _0 UN _1 Creatinine 0.65 0.67 0.64   Glucose 117* 109* 89   Calcium 8.6 8.8 8.9         Lab results: 05/27/17  0019 05/24/17  2350 05/23/17  0030 05/21/17  0420 05/18/17  2345   Total  Protein 7.1 7.1 7.7 6.6 7.0   Albumin 2.4* 2.4* 2.7* 2.3* 2.4*   ALT 47* 49* 78* 64* 78*   AST 56* 51* 84* 57* 70*   Alk Phos 396* 428* 568* 467* 569*   Bilirubin,Total <0.2 0.2 0.2 <0.2 0.2         Lab results: 05/22/17  0040   CK 71       Micro:   1/25: Intraoperative bile: GS 0 pmns, no organisms, Cx: no growth  2/12: 1 set of blood cultures from the right Mediport (no peripheral bld wase sent): positive for Enterobacter cloacae complex in 70 hrs, sensitive to zosyn.   2/18 abdominal GS had >25 PMNs, many GPC in pairs/chains, many GNB and cultures 4+ Enterobacter cloacae complex. The cultures also grew 1+ Candida glabrata   2/21: BCx from the Right peripheral IV, mediport, L IJ:  No  growth  05/03/17: IR-guided I&D with drain placement of the left upper quadrant collection: GS > 25 PMNs, many GNB and GN diplococci, Cultures with 4+ Enterobacter cloacae complex (resistant to Zosyn and CTX), C. Albicans and C. Glabrata, and Candida tropicalis  05/04/17:    1 set of blood cultures from left IJ: VRE at 15.3 TTP. Daptomycin sensitivities: MIC 35mg/ml (dose dependent)   1 set from periphery: VRE and Enterobacter cloacae at 14.8 TTP   05/05/17:  IR-guided I&D with drain placement of the intrahepatic collection: GS 1-10 PMNs, very few GPC in pairs. Fungal cultures with Candida glabrata  05/05/17: Blood cultures from periphery, mediport and IJ: no growth  05/04/17: Urine cultures obtained (for unclear reason): no growth  05/08/17: Abscess GS: >25 PMNs, many GPC in chains, few GNB. Aerobic cultures growing 2+ Enterobacter (ertapenem-sensitive) and 3+VRE, Fungal: C. glabrata-sensitivities below)        Imaging/Other Relevant Diagnostics:     05/14/17 CT abd/pelvis with contrast:  FINDINGS:  Chest Base: Moderate left pleural effusion. Left lower lobe atelectasis. Right lower lobe consolidation with bronchiectasis similar to prior.  Liver/Biliary Tract: Unchanged internal/external biliary drain traversing the right hemiliver with tip in  the duodenum. Unchanged periductal edema and ill-defined low-attenuation collection along the drain in segment 6.  Decreased size of the fluid collection along the anterior margin of segment 2 status post percutaneous drain placement with small residual fluid and gas in this location. 3 cm low-attenuation lesion in segment 6 is stable compared to prior exams 2 cm low-attenuation lesion in segment 6/7 and tiny low-attenuation lesion in segment 7 are stable and compatible with hemangioma and cysts, respectively, when compared to prior imaging from 2018.  Status post cholecystectomy. Pneumobilia.  Pancreas: Status post Whipple. Stable fluid collection adjacent to the Main portal vein with focal luminal narrowing of the Main portal vein. The remaining pancreatic body and tail is unremarkable.  Spleen: Multiloculated fluid collections in the left upper quadrant are decreased in size compared to prior exam with 3 residual pockets of fluid with rim enhancement and small foci of gas. A percutaneous drain is within one of the collections along the anterior margin of the spleen. The largest of 3 residual left upper quadrant collections measures up to 4 cm in axial dimensions.  Adrenals: Unremarkable.  Kidneys and Collecting Systems: Unremarkable.  Lymph Nodes: Unremarkable.  Vessels: Mild atherosclerosis of the abdominal aorta. No aneurysm. Focal luminal narrowing of the main portal vein adjacent to the fluid collection is described above. These findings are unchanged.  GI Tract/Mesentery and Peritoneal Cavity: Status post Whipple. Patent gastrojejunostomy. Percutaneous drain in the ventral peritoneum. No residual fluid in this location. Small soft tissue nodule adjacent to the rectum measuring approximately 2 cm,   series 3 image 180 stable compared to prior exams.  Uterus/Ovaries: Status post hysterectomy.  Bladder: Small focus of gas in the urinary bladder lumen, likely related to recent instrumentation.  Soft  Tissues/Musculoskeletal: Diffuse subcutaneous edema. Postsurgical changes in the ventral abdominal wall. No acute osseous abnormalities.   IMPRESSION:  -Decreased size of the fluid collection along the anterior margin of the left hepatic lobe status post percutaneous drain with small amount of residual fluid and gas in this location.   - Decreased size of the fluid collections in the left upper quadrant. No residual fluid in the ventral peritoneum at the site of drain.  -Unchanged fluid collection adjacent to the main portal vein which results in focal luminal narrowing of the main portal  vein.  -Unchanged periductal edema and focal fluid collection along the right internal/external biliary drain. Additional unchanged low-attenuation liver lesion/fluid collection in segment 6 measuring 3 cm, may represent abscess, biloma or metastasis.  -Unchanged soft tissue nodule adjacent to the rectum, may represent tumor deposit.  -Unchanged moderate left pleural effusion with left lower lobe atelectasis. Unchanged consolidation in the right lower lobe with bronchiectasis.    05/23/17 CT abd/pelvis with contrast:   FINDINGS:  Chest Base: Moderate left pleural effusion with left lower lobe atelectasis. Right lower lobe consolidation with bronchiectasis posterior right lung base.   Liver/Biliary Tract: Persistent internal/external biliary drainage catheter traversing the right hepatic lobe with its distal tip in the duodenum. Irregular 2.1 x 1.4 cm hypodensity inferior right lobe, image 202-84, previously 2.9 x 1.7 cm. Unchanged 1.9 x 1 cm low-attenuation lesion posterior margin right lobe, image 202-63.  2.3 x 1.9 cm fluid collection anterior margin left lobe, image 202-50. Interval removal of a percutaneous drainage catheter from this site.  Status post cholecystectomy. Unchanged periductal edema and focal fluid collection along the right internal/external biliary drain. Moderate intrahepatic biliary duct dilatation.   Pancreas:  Status post Whipple procedure. Persistent fluid collection adjacent to the main portal vein measuring approximately 4.4 x 2.5 cm, image 202-81. This narrows the portal vein, as before.  Spleen: Again noted are several multiloculated fluid collections left upper quadrant, post percutaneous drainage removal. For example, 4.1 x 2.8 cm collection, image 202-40. This appears to communicate with a more inferior left apical collection measuring 4.8 x 1.9 cm, image 202-57. These collections appear to communicate on coronal images and spanning an area measuring approximately 8.6 cm in craniocaudal length, coronal image 203-80.  Adrenals: Unremarkable.  Kidneys and Collecting Systems: Unremarkable.  Lymph Nodes: Unremarkable.  Vessels: Mild atherosclerosis of the abdominal aorta. No aneurysm. Focal luminal narrowing of the main portal vein adjacent to the fluid collection is described above. These findings are unchanged.  GI Tract/Mesentery and Peritoneal Cavity: Post Whipple procedure. Persistently focally dilated loop of terminal ileum in the right iliac fossa measuring up to 4.2 cm in diameter, image 202-173. Proximal to the dilatation is an area of focal narrowing/stricture, image 202-174.  Persistent soft tissue nodule adjacent to the rectum is slightly different configuration than before measuring 2.4 x 1.3 cm, image 202-200, previously 2.1 x 1.2 cm.  Uterus/Ovaries: Unremarkable.  Bladder: Unremarkable.  Soft Tissues/Musculoskeletal: No acute abnormality.    IMPRESSION:   1. Again noted are several communicating multiloculated fluid collections in the left upper quadrant post percutaneous drainage removal. These collections have slightly increased in size compared with 05/14/2017.  2. Reaccumulation of the fluid collection along the anterior margin of the left hepatic lobe, status post percutaneous drainage removal.  3. Slight interval decrease in size of an irregular fluid collection inferior right hepatic lobe. Other  stable low-attenuation lesions unchanged in size.   4. Unchanged moderate left pleural effusion with left lower lobe atelectasis. Unchanged consolidation in the right lower lobe with bronchiectasis.  5. Unchanged periductal edema and focal fluid collection along the right internal/external biliary drain.      Assessment and Plan:  ID problem(s):   -Enterobacter cloacae bacteremia  -Multiple and extensive Intra-abdominal abscesses growing VRE, enterobacter and candida glabrata    72yo old female with pancreatic cancer, s/p Whipple procedure on 03/29/17 after neoadjuvant chemotherapy and radiation. Her post op course was c/b delayed gastric emptying, gastric distension and obstruction and a large surgical bed hematoma causing CBD and  hepatic vein compression, severe biliary sepsis, s/p placement of a right hepatic duct external biliary drain placed on 2/12, found to have enterobacter cloacae bacteremia (2/12) presumed to be secondary to infection of the hematoma. However CT of abdomen (2/18) showed the hematoma had resolved but noted to have a well-circumscribed and larger anterior abdominal collection percutaneously, which was aspirated (2/19) and cultures grew enterobacter cloacae and Candida glabrata. ID service consulted on 2/21 with question of treating C.glabrata.  Caspofungin was started and a perc drain was placed LUQ 3/1, then anterior abdominal perc drain placed 3/3, and then anterior perc drain into left hepatic collection 05/08/17, the goal being source control with drain placements. She had been on Zosyn but her enterobacter became (amp-C inducible) thus switched to ertapenem and Daptomycin added on 05/04/17 for continued broad coverage.  Abdominal perc drains  In fluid collections were removed and biliary tube remains (clamped). Caspofungin was switched to oral high-dose fluconazole (48m/kg) daily once sensitivities returned on C.glabrata. Pt is also on methadone for pain control, concern for prolonged QTc  with fluconazole and methadone, recent ECG = QTc 409.     STaleahdenies feverishness or chills,she remains afebrile with a white count around 11K.  She feels about the same as she did when I saw her a few days ago although appears to be having more abdominal discomfort with any position change. Her last CT of abd/pelvis on 3/21 (see above) showed several communicating multiloculated fluid collections in the LUQ.  The question of antimicrobial duration is problematic without further data regarding if these collections are sterile vs infected.  Ideally, if the collections could be drained for source control and cultured for data, we could recommend a more definitive antimicrobial plan. The approach to stop the Dapto/Erta/fluconazole regimen as planned on 05/29/17 and closely monitor the patient is an option, but there is a relatively high likelihood for reoccurrence of infection without resolution of the fluid collections. Alternatively, we could recommend extending treatment based previous culture data, but without source control, it is not clear how long to extend for.  Additionally, there is not good oral alternative for Daptomycin to VRE in this patient.  If the goal is for this patient is to be discharged soon to a SNF for rehab, Daptomycin is often a barrier due to cost.  We also need to consider if the collections are accessible for IR drainage.  Discussed above considerations with Dr. PChrista See(Hepatobiliary, Pancreatic, GI surgery team) in regards to what the goals of care are for this patient.        Recommendations:   Depending on goals of care, consider drainage of Intraabdominal fluid collections with cultures.      For now, plan to continue current regimen: IV Daptomycin (8 mg/kg) 500 mg daily, IV Ertapenem 1 gram daily, oral fluconazole 12 mg/kg (round up to 800 mg) daily, with plan to potentially discontinue on 05/29/17.  Will discuss further with ID attending and Dr. GRon Agee    Follow weekly CBC with  diff, CMP, CRP/ESR,   Follow weekly CK while on Dapto   ID (Team 3) will continue to follow patient    Patient's case and plan of care discussed with ID attending Dr. GEdgar Frisk     Thank you for allowing [facility] participate in the care of this patient  Please call with questions  .   VLonzo Candy ANP-BC  Infectious Diseases, Team 3  Pager [phone]     ID ATTENDING PROGRESS ADDENDUM  I have today reviewed Ailah Posada's clinical course, recent cultures, lab and other pertinent results, and discussed in detail with the ID consult team 3.  I concur with the assessment as above.  Patient has received ~ 3wks ertapenem + daptomycin + antifungal (caspofungin transitioned to fluconazole high dose) for polymicrobial intra-abdominal collections which persist / increased in size on imaging.  Ideally the largest of collections could be further drained for source control, and fluid cultured/stained in order to determine whether still infected.      Author:  Sammuel Hines. Edgar Frisk, MD 05/28/2017 11:34 AM

## 2017-05-27 NOTE — Invasive Procedure Plan of Care (Signed)
Invasive Procedure Plan of Care (Consent Form 419):   Condition(s) Addressed: Nutritional difficulities   Performing Provider: Lora Paula, and Vascular and Interventional Radiology Attendings, Fellows, Residents and Advanced Practice Providers     Side:    Procedure: Placement of a feeding tube and maintenance of feeding tube for 12 months.   Special Equipment:    Planned Anesthesia: Pending   Benefits: Meet nutritional needs.   Risks: Allergic reaction to dye, bleeding, infection, injury to surrounding organs and/or structures, and in very rare circumstances death.   Alternatives: Not to perform the procedure   Expected Length of Stay: 0 day(s)     I, or a designated member of my surgical team, have discussed the planned procedure, including the potential for any transfusion of blood products or receipt of tissue as necessary, expected benefits, the potential complications and risks and possible alternatives and their benefits and risks with the patient or the patient's surrogate. In my opinion, the patent or the patient's surrogate understands the proposed procedure, its risks, benefits, and alternatives.    Electronically signed by Louis Meckel, MD at 2:42 PM     Patient Consent:  I hereby give my consent and authorize Lora Paula, and Vascular and Interventional Radiology Attendings, Fellows, Residents and Advanced Practice Providers    (The list of possible assistants, all of whom are privileged to provide surgical services at the hospital, is available)  To treat the following: Nutritional difficulities  Procedure includes: Placement of a feeding tube and maintenance of feeding tube for 12 months.  Laterality:   1 The care provider has explained my condition to me, the benefits of having the above treatment procedure, and alternate ways of treating my condition. I understand that no guarantees have been made to me about the result of the treatment. The alternatives to this procedure include: Not  to perform the procedure   2 The care provider has discussed with me the reasonably foreseeable risks of the treatment and that there may be undesirable results. The risks that are specifically related to this procedure include: Allergic reaction to dye, bleeding, infection, injury to surrounding organs and/or structures, and in very rare circumstances death.   3 I understand that during the treatment a condition may be discovered which was not known before the treatment started. Therefore, I authorize the care provider to perform any additional or different treatment which is thought necessary and available.   4 Any tissue, parts, or substances removed during the procedure may be retained or disposed of in accordance with customary scientific, educational and clinical practice.   5 Vendor information if appropriate:    6 If blood products are needed, I would agree:    7 If tissue products are needed, I would agree:    8 Blood/Tissue use limitations and/or exclusions:       I have carefully read and fully understand this informed consent form, and have had sufficient opportunity to discuss my condition and the above procedure(s) with the care provider and his/her associates, and all of my questions have been answered to my satisfaction. I understand that my surgeon/provider performing the procedure may not be physically present in the operating/procedure room the entire time that I am there. My surgeon/provider has answered my questions regarding this and how it may relate to my surgery/procedure. I agree to the Plan of Care as outlined above.  Patient Signature   (or Parent/Legal Guardian if pt is unable to sign or is a minor)  Date/Time     Electronic Signatures will display at the bottom of the consent form.

## 2017-05-27 NOTE — Progress Notes (Addendum)
Surgical Oncology/Hepatobiliary Surgery Progress Note     LOS: 59 days     Subjective:  In bed, resting comfortably. Took in only 270 po yesterday. No nausea/vomiting. Passing gas, having bowel movements.     KUB to evaluate DHT placement- enteric.     Objective:  Vitals Sign Ranges for Past 24 Hours:  BP: (108-138)/(62-80)   Temp:  [36.3 C (97.3 F)-36.8 C (98.2 F)]   Temp src: Temporal (03/25 0342)  Heart Rate:  [95-118]   Resp:  [16-17]   SpO2:  [90 %-98 %]       Physical Exam:  General Appearance: in NAD, speaking slowly/quietly  Cardiac: regular rate  Respiratory: non-labored breathing on room air  Abdomen: Soft, minimally tender to palpation, distended, mild ecchymosis, biliary tube clamped  Extremities: warm    Labs:    CBC:    Recent Labs  Lab 05/27/17  0019 05/26/17  0002 05/24/17  2350 05/24/17  0805 05/23/17  0030 05/21/17  0420   WBC 10.9* 11.2* 9.8 9.8 13.0* 9.5   Hemoglobin 6.9* 7.3* 7.0* 7.3* 8.3* 7.0*   Hematocrit 23* 23* 23* 23* 26* 23*   Platelets 526* 534* 528* 533* 638* 132*       Metabolic Panel:    Recent Labs  Lab 05/27/17  0019 05/24/17  2350 05/23/17  0030 05/21/17  0420   Sodium 136 133 132* 132*   Potassium 4.2 4.5 4.8 4.4   Chloride 97 95* 92* 93*   CO2 '27 26 25 27   ' UN '20 19 19 18   ' Creatinine 0.65 0.67 0.64 0.62   Glucose 117* 109* 89 105*   Calcium 8.6 8.8 8.9 8.5*   Magnesium 2.0 1.9 1.8 1.7   Phosphorus 4.8* 5.1* 5.1* 4.9*          Assessment:  72 y.o. female with h/o recent PE and pancreatic cancer POD # 59  status post whipple procedure complicated by delayed gastric emptying.  NGT replaced on 04/04/17. UGI on 04/12/17 concerning for obstruction just beyond the [place] in the efferent limb.  Course complicated on 4/40/10 by rising LFTs and sepsis with CT concerning for cholangitis given biliary tree obstruction and PV compression due to hematoma and afferent limb distention in setting of PE treatment. Is s/p IR PTC on 04/16/17 and IR percutaneous aspiration of anterior abdominal fluid  collection on 04/24/17. IR LUQ perc drain placement 3/1. IR anterior abdominal 84f perc drain placement 3/3. IR anterior perc drain into left hepatic collection 3/6.    Plan:  - Regular diet + ensures  - Continue to encourage PO/enteral nutrition, TFs to goal - now cycled at 12 hours   - Aggressive bowel regimen  - CT scan 3/21 with intra-abd collections, new portal venous collection. However, patient remains afebrile, clinically stable, WBCs downtrending, so do not plan to drain at this time. Continue ertapenem/caspo/dapto, appreciate ID recs.  - Continue capped biliary drain  - SCDs, Lovenox for VTE ppx (hold therapeutic anticoagulation)   - Dispo: Pending clinical course, adequate nutrition/PO intake    NCristie Hem MD  05/27/2017     7:02 AM   General Surgery Resident      HPB-GI Attending Addendum:   I personally examined the patient, reviewed the notes, and discussed the plan of care with the residents and patient. Agree with detailed resident's note. Please see the note above for details of history, exam, labs, assessment/plan which reflect my input.  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 Abdominal abscess on imaging. Drains placed with purulent drainage. SIRS without sepsis. Improving.      Hyperglycemia with insulin sliding scale and recurrent need for insulin. continue   Post operative anemia. Stable.  Bilateral pleural effusions. Stable. No oxygen requirement.   Pulmonary embolism. Present on admission. No evidence of worsening.  Continue to hold anticoagulation.  Hypoalbuminemia. Moderate malnutrition. Tube feedings at goal. Encourage oral intake.   Depression. Encouragement provided. Slightly improved.   Leukocytosis. Improved.  Multiple intraabdominal abscesses with Enterobacter cloacae complex bacteremia and fungus. Second drain with minimal output and clearing. Will remove  today.   Antibiotics until 3/20  Constipation . Aggressive regimen.         Delano Metz, MD, FACS  Hepatobiliary, Pancreas & GI Surgery

## 2017-05-27 NOTE — Consults (Signed)
Interventional Radiology Consult Note    Reason for Consult: G tube placement    HPI : Amanda Galloway is a 72 y.o. female with a hx of  Pancreatic cancer who is POD # 17 s/p Whipple procedure with decreased PO tolerance. Consult for G tube placement. Patient has a Dobbhoff tube in place and has tolerated tube feeds. Risks and benefits discussed at bedside with daughter present.     History:    Past Medical History:   Diagnosis Date    Acute kidney failure 03/31/2017    Cancer     Depression     Fibromyalgia     Hypothyroidism       Past Surgical History:   Procedure Laterality Date    CHOLECYSTECTOMY      CHOLECYSTECTOMY, LAPAROSCOPIC  09/06/2016    HYSTERECTOMY  08/1986    Fibroids    KNEE SURGERY Right     PR INSERT TUNNELED CV CATH WITH PORT Right 10/04/2016    Procedure: Right IJ MEDIPORT Insertion;  Surgeon: Delano Metz, MD;  Location: [facility] MAIN OR;  Service: Oncology General    PR LAP,DIAGNOSTIC ABDOMEN N/A 10/04/2016    Procedure: LAPAROSCOPY DIAGNOSTIC;  Surgeon: Delano Metz, MD;  Location: [facility] MAIN OR;  Service: Oncology General    PR PART Kyle PANC,PROX+REMV DUOD+ANAST N/A 03/29/2017    Procedure: WHIPPLE PROCEDURE;  Surgeon: Delano Metz, MD;  Location: [facility] MAIN OR;  Service: Oncology General    ROTATOR CUFF REPAIR Right     TONSILLECTOMY AND ADENOIDECTOMY        Prescriptions Prior to Admission   Medication Sig    methadone (DOLOPHINE) 5 MG tablet Take 15 mg by mouth nightly    LORazepam (ATIVAN) 0.5 MG tablet Take 1 tablet (0.5 mg total) by mouth 2 times daily as needed for Anxiety   Max daily dose: 1 mg (Patient taking differently: Take 0.5 mg by mouth 2 times daily as needed for Anxiety   0.66m nightly and prn)    methadone (DOLOPHINE) 5 MG tablet Take 235min the morning, 2077mn afternoon and 58m90m night. (Patient taking differently: 10 mg 2 times daily   Take 20mg29mthe morning, 20mg 33mfternoon and 58mg a38mght.)    enoxaparin (LOVENOX) 100 mg/mL injection Inject 1 Syringe (100 mg  total) into the skin daily (Patient taking differently: Inject 100 mg into the skin 2 times daily )    potassium chloride SA (KLOR-CON M20) 20 mEq  tablet Take 20 mEq by mouth 2 times daily    famotidine (PEPCID) 20 MG tablet Take 1 tablet (20 mg total) by mouth 2 times daily    escitalopram (LEXAPRO) 20 MG tablet Take 1 tablet (20 mg total) by mouth daily (Patient taking differently: Take 20 mg by mouth nightly   )    amitriptyline (ELAVIL) 25 MG tablet Take 1 tablet (25 mg total) by mouth nightly    dexamethasone (DECADRON) 4 MG tablet Take 1 tablet (4 mg total) by mouth 2 times daily (Patient taking differently: Take 4 mg by mouth daily (with breakfast)   )    docusate sodium (COLACE) 100 MG capsule Take 2 capsules (200 mg total) by mouth 2 times daily    metoclopramide (REGLAN) 5 MG tablet Take 1 tablet (5 mg total) by mouth 3 times daily (before meals)    pantoprazole (PROTONIX) 40 MG EC tablet Take 1 tablet (40 mg total) by mouth 2 times daily (before meals)   Swallow whole.  Do not crush, break, or chew.    senna (SENOKOT) 8.6 MG tablet Take 2 tablets by mouth 2 times daily    PREMARIN 0.625 MG tablet Take 0.625 mg by mouth every morning       ondansetron (ZOFRAN-ODT) 4 MG disintegrating tablet Take 1 tablet (4 mg total) by mouth 3 times daily as needed for Nausea   Place on top of tongue.    levothyroxine (SYNTHROID, LEVOTHROID) 137 MCG tablet Take 1 tablet (137 mcg total) by mouth daily (before breakfast)    Vitamins - senior (CENTRUM SILVER) TABS Take 1 tablet by mouth daily (Patient taking differently: Take 1 tablet by mouth every morning   )    triamterene-hydrochlorothiazide (MAXZIDE-25) 37.5-25 MG per tablet Take 1 tablet by mouth daily (Patient taking differently: Take 1 tablet by mouth every morning   )    fluorouracil (ADRUCIL) 50 MG/ML injection Administer 3,477 mg into the vein every 14 days   Start in infusion center.  Infuse over 46 hours at home.    pegfilgrastim (NEULASTA) 6  MG/0.6ML injection syringe Inject 0.6 mLs (6 mg total) into the skin once    Meds Exist in Therapy Plan Peg-Filgrastim    heparin 100 UNIT/ML injection 5 mLs (500 Units total) by Intracatheter route as needed (for line care)    sodium chloride 0.9 % flush 10 mLs by Intracatheter route as needed (for line care)     Current Medications:    Barium Sulfate  100 mL Per NG tube Once    [START ON 05/28/2017] daptomycin IV  8 mg/kg Intravenous Q24H    methadone  7.5 mg Oral BID    methadone  5 mg Oral Daily    methylphenidate  5 mg Oral BID    fluconazole  800 mg Oral Daily    insulin glargine  6 Units Subcutaneous Nightly    pantoprazole  40 mg Oral BID AC    amitriptyline  25 mg Oral Nightly    PROSOURCE NO CARB  30 mL Oral BID WC    ertapenem  1,000 mg Intravenous Q24H    senna  2 tablet Oral 2 times per day    bisacodyl  10 mg Rectal Daily    enoxaparin  40 mg Subcutaneous Daily    DULoxetine  20 mg Oral Daily    metoclopramide  10 mg Oral 4x Daily AC & HS    lidocaine  1 patch Transdermal Q24H    docusate sodium  200 mg Oral 2 times per day    polyethylene glycol  17 g Oral 2 times per day    acetaminophen  1,000 mg Oral Q8H    levothyroxine  137 mcg Oral Daily     No Known Allergies (drug, envir, food or latex)   Social History   Substance Use Topics    Smoking status: Never Smoker    Smokeless tobacco: Never Used    Alcohol use No      Family History   Problem Relation Age of Onset    Breast cancer Mother         died 53    Cancer Father         NHL, died 40    Heart Disease Father     Diabetes Sister     Obesity Sister         4 siblings        Objective:     BP: (100-120)/(56-80)   Temp:  [36.3 C (  97.3 F)-36.7 C (98.1 F)]   Temp src: Temporal (03/25 1243)  Heart Rate:  [97-104]   Resp:  [16-17]   SpO2:  [90 %-95 %]     No intake/output data recorded.      Physical Exam:    Gen: NAD  CV: RRR  Lungs: CTAB   Abd: Biliary drain in place, site C/D/I, Generalized soreness without  localized tenderness  Neuro: Alert and oriented, answers questions appropriately    Labs:  Recent Results (from the past 24 hour(s))   POCT glucose    Collection Time: 05/26/17 10:43 PM   Result Value    Glucose POCT 120 (H)   Prealbumin    Collection Time: 05/27/17 12:19 AM   Result Value    Prealbumin 13 (L)   Triglycerides    Collection Time: 05/27/17 12:19 AM   Result Value    Triglycerides 196 (!)   Protime-INR    Collection Time: 05/27/17 12:19 AM   Result Value    Protime 15.3 (H)    INR 1.4 (H)   Comprehensive metabolic panel    Collection Time: 05/27/17 12:19 AM   Result Value    Sodium 136    Potassium 4.2    Chloride 97    CO2 27    Anion Gap 12    UN 20    Creatinine 0.65    GFR,Caucasian 90    GFR,Black 103    Glucose 117 (H)    Calcium 8.6    Total Protein 7.1    Albumin 2.4 (L)    Bilirubin,Total <0.2    AST 56 (H)    ALT 47 (H)    Alk Phos 396 (H)   Magnesium    Collection Time: 05/27/17 12:19 AM   Result Value    Magnesium 2.0   Phosphorus    Collection Time: 05/27/17 12:19 AM   Result Value    Phosphorus 4.8 (H)   CBC and differential    Collection Time: 05/27/17 12:19 AM   Result Value    WBC 10.9 (H)    RBC 2.4 (L)    Hemoglobin 6.9 (L)    Hematocrit 23 (L)    MCV 94    MCH 28    MCHC 30 (L)    RDW 17.4 (H)    Platelets 526 (H)    Seg Neut % 73.1    Lymphocyte % 13.0    Monocyte % 8.5    Eosinophil % 3.7    Basophil % 0.7    Neut # K/uL 7.9 (H)    Lymph # K/uL 1.4    Mono # K/uL 0.9    Eos # K/uL 0.4    Baso # K/uL 0.1    Nucl RBC % 0.1    Nucl RBC # K/uL 0.0    IMM Granulocytes # 0.1    IMM Granulocytes 1.0             Assessment and Plan:    Amanda Galloway is a 72 y.o. female with a hx of  Pancreatic cancer who is POD # 58 s/p Whipple procedure with decreased PO tolerance. Consult for G tube placement.        - NPO at midnight  - Barium to be given via [place] at [place] for Image guided G tube placement tomorrow 3/26 if safe window can be found  - Risks and benefits discussed at bedside with  daughter present  Louis Meckel, MD  PGY-2  Interventional Radiology     05/27/2017 at 3:17 PM

## 2017-05-27 NOTE — Invasive Procedure Plan of Care (Signed)
Invasive Procedure Plan of Care (Consent Form 419):   Condition(s) Addressed: Need for moderate sedation during procedure/minor surgery.   Performing Provider: Tilden Dome K   Side: Not applicable    Procedure: Establishment of moderate sedation.   Special Equipment: None   Planned Anesthesia:    Benefits: State of altered consciousness created by administration of intravenous medications (medications in an IV).  Patients under moderate sedation will feel sleepy/relaxed, but will respond to commands and may have memory of the procedure.  The purpose of moderate sedation is to make you feel comfortable during your procedure, and can make your procedure easier to perform.     Risks: Excessive sedation, which may require special procedures to keep you safe, including placement of a breathing tube.  Nausea, vomiting, problems with heart rate or blood pressure, breathing difficilties, very rarely death. Occasionally, vivid dreams or agitation requiring additional medication.   Alternatives: Not undergoing procedure, undergoing procedure without sedation.   Expected Length of Stay:  day(s)     I, or a designated member of my surgical team, have discussed the planned procedure, including the potential for any transfusion of blood products or receipt of tissue as necessary, expected benefits, the potential complications and risks and possible alternatives and their benefits and risks with the patient or the patient's surrogate. In my opinion, the patent or the patient's surrogate understands the proposed procedure, its risks, benefits, and alternatives.    Electronically signed by Louis Meckel, MD at 2:42 PM     Patient Consent:  I hereby give my consent and authorize Tilden Dome K  (The list of possible assistants, all of whom are privileged to provide surgical services at the hospital, is available)  To treat the following: Need for moderate sedation during procedure/minor surgery.  Procedure includes:  Establishment of moderate sedation.  Laterality: Not applicable  1 The care provider has explained my condition to me, the benefits of having the above treatment procedure, and alternate ways of treating my condition. I understand that no guarantees have been made to me about the result of the treatment. The alternatives to this procedure include: Not undergoing procedure, undergoing procedure without sedation.   2 The care provider has discussed with me the reasonably foreseeable risks of the treatment and that there may be undesirable results. The risks that are specifically related to this procedure include: Excessive sedation, which may require special procedures to keep you safe, including placement of a breathing tube.  Nausea, vomiting, problems with heart rate or blood pressure, breathing difficilties, very rarely death. Occasionally, vivid dreams or agitation requiring additional medication.   3 I understand that during the treatment a condition may be discovered which was not known before the treatment started. Therefore, I authorize the care provider to perform any additional or different treatment which is thought necessary and available.   4 Any tissue, parts, or substances removed during the procedure may be retained or disposed of in accordance with customary scientific, educational and clinical practice.   5 Vendor information if appropriate: If a vendor representative is expected to be present during my procedure, it has been explained to me that the vendor representative works for (manufacturer of the device to be used) and that his/her role includes . I consent to the vendor representative's presence and involvement as described. If circumstances change and a decision is made during my procedure that a vendor representative's presence is needed, I will be notified of the above after my procedure is completed.  Equipment: None   6 If blood products are needed, I would agree:    7 If tissue products  are needed, I would agree:    8 Blood/Tissue use limitations and/or exclusions:       I have carefully read and fully understand this informed consent form, and have had sufficient opportunity to discuss my condition and the above procedure(s) with the care provider and his/her associates, and all of my questions have been answered to my satisfaction. I understand that my surgeon/provider performing the procedure may not be physically present in the operating/procedure room the entire time that I am there. My surgeon/provider has answered my questions regarding this and how it may relate to my surgery/procedure. I agree to the Plan of Care as outlined above.                       Patient Signature   (or Parent/Legal Guardian if pt is unable to sign or is a minor)  Date/Time     Electronic Signatures will display at the bottom of the consent form.

## 2017-05-27 NOTE — Progress Notes (Signed)
05/27/17 0902   [facility] Patient Class Review   Patient Class Review Inpatient   Patient Class Effective as of 03/29/2017    Edwyna Shell, RN  Utilization Review   [phone]  Pager: [phone]

## 2017-05-27 NOTE — Plan of Care (Signed)
Problem: Pain/Comfort  Goal: Patient's pain or discomfort is manageable  Outcome: Progressing towards goal  Pt reporting pain today 4-10 .  Continues on scheduled methadone     Problem: Mobility  Goal: Functional status is maintained or improved - Geriatric  Outcome: Progressing towards goal  Pt t still needs encouragement to get up and   Move around , was able to ambulate c walker and stand by assist  Around her room .     Problem: Nutrition  Goal: Nutritional status is maintained or improved - Geriatric  Outcome: Progressing towards goal  Pt on cyclic tube  Feeds . Continues to have poor po intake .     Problem: Post-Operative Bowel Elimination  Goal: Elimination pattern is normal or improving  Outcome: Progressing towards goal  Pt had large bm after bowel regimen given     Problem: Psychosocial  Goal: Demonstrates ability to cope with illness  Outcome: Progressing towards goal  Pt emotional at times , gets tearful easily .  Positive reinforcement given .

## 2017-05-27 NOTE — Progress Notes (Signed)
Assumed care of pt at  0700. . VSS .  Pt a/ox3 . Pt does  endorse hallucinations at times .  Pt has been doing this for sometime .  Pt seen by Palliative  Care today . See note which addresses pt's hallucinations .  Per daughter  she feels it is from the Ritalin .  Pt continues to have poor po intake , cyclic tube feeds overnight .  Pt will have PEG placed tomorrow .   Pt incontinent of a large amount of stool and urine  . Up in chair most of the  day .  Writer spoke to provider regarding [place] marked at 73 ,  [place] to use .  Pt only getting water flushes during the day .  Pt taking pills well c applesauce .   Pt currently resting in chair , call bell in reach.     IVAD re accessed per order . Provider to d/c IJ.  Pt pain controlled c current  Regimen . Daughter at bedside  Very supportive .  Reportgiven  to next shift .

## 2017-05-28 ENCOUNTER — Inpatient Hospital Stay: Payer: Medicare (Managed Care)

## 2017-05-28 ENCOUNTER — Other Ambulatory Visit: Payer: Self-pay | Admitting: Radiology

## 2017-05-28 LAB — CBC AND DIFFERENTIAL
Baso # K/uL: 0.1 10*3/uL (ref 0.0–0.1)
Basophil %: 0.7 %
Eos # K/uL: 0.4 10*3/uL (ref 0.0–0.4)
Eosinophil %: 3.2 %
Hematocrit: 23 % — ABNORMAL LOW (ref 34–45)
Hemoglobin: 6.9 g/dL — ABNORMAL LOW (ref 11.2–15.7)
IMM Granulocytes #: 0.1 10*3/uL
IMM Granulocytes: 0.5 %
Lymph # K/uL: 1.3 10*3/uL (ref 1.2–3.7)
Lymphocyte %: 11.6 %
MCH: 28 pg/cell (ref 26–32)
MCHC: 31 g/dL — ABNORMAL LOW (ref 32–36)
MCV: 92 fL (ref 79–95)
Mono # K/uL: 0.9 10*3/uL (ref 0.2–0.9)
Monocyte %: 8.1 %
Neut # K/uL: 8.7 10*3/uL — ABNORMAL HIGH (ref 1.6–6.1)
Nucl RBC # K/uL: 0 10*3/uL (ref 0.0–0.0)
Nucl RBC %: 0.1 /100 WBC (ref 0.0–0.2)
Platelets: 544 10*3/uL — ABNORMAL HIGH (ref 160–370)
RBC: 2.4 MIL/uL — ABNORMAL LOW (ref 3.9–5.2)
RDW: 17.2 % — ABNORMAL HIGH (ref 11.7–14.4)
Seg Neut %: 75.9 %
WBC: 11.5 10*3/uL — ABNORMAL HIGH (ref 4.0–10.0)

## 2017-05-28 LAB — POCT GLUCOSE: Glucose POCT: 121 mg/dL — ABNORMAL HIGH (ref 60–99)

## 2017-05-28 MED ORDER — LIDOCAINE 5 % EX PTCH *I*
2.0000 | MEDICATED_PATCH | CUTANEOUS | Status: DC
Start: 2017-05-28 — End: 2017-05-31
  Administered 2017-05-28 – 2017-05-29 (×2): 2 via TRANSDERMAL
  Filled 2017-05-28 (×3): qty 2

## 2017-05-28 NOTE — Progress Notes (Signed)
Interventional Radiology Pre-Procedure Handoff/Checklist    NPO: [x] YES [] NO [] N/A -received barium last HS at 2000     Anticoagulants:lovenox     Telemetry: [] YES [x] NO [] N/A     Transport mode: Bed  Number of Transporters needed: 2-hover      IV access:  IVAD    Respiratory: Room Air    Consentable:yes    Alert and Oriented to person, place and time?: yes -hallucinations at times    Code Status:full     Does patient wear an insulin pump? [] YES [] NO [x] N/A     Precautions:none    Allergies: No Known Allergies (drug, envir, food or latex)    Rubin Payor, RN Received Handoff Report from [facility] for IR procedure 7:30 AM    Informed bedside nurse to get KUB

## 2017-05-28 NOTE — Plan of Care (Signed)
Problem: Impaired Stair Navigation  Goal: LTG - IMPROVE STAIR NAVIGATION  Patient will navigate 4-7 steps with 1  rail(s) and least restrictive assistive device and Modified independence     Time frame: 7-10 days

## 2017-05-28 NOTE — Progress Notes (Signed)
Lajoy Vanamburg is currently functioning below her baseline and will likely require SNF rehab before returning to she prior living arrangement. she will likely return to her prior level of functioning with a short term rehab stay. However, if discharge directly to home is desired, Shamera Yarberry will need 24 hour assistance with all mobility and require Home PT     PT Discharge Equipment Recommended: None if returning to their prior living environment today.    Physical Therapy Treatment Note:     05/28/17 1135   Visit Details [facility])   Visit Type [facility]) Follow Up   Reason for visit [facility]) [place]   Visit Number   Visit Number [facility]) / Treatment Day (HH) 9   Precautions/Observations   Precautions used Yes   LDA Observation Feeding tube;IV lines  (disconnected)   Fall Precautions General falls precautions   Other Pt supine in bed, agreeable for PT. RN in to assist with disconnecting   Pain Assessment   *Is the patient currently in pain? Yes   Pain (Before,During, After) Therapy Before   0-10 Scale 5   Pain Location Abdomen   Pain Orientation Left   Pain Intervention(s) Repositioned;Refer to nursing for pain management   Cognition   Arousal/Alertness Appropriate responses to stimuli   Following Commands Follows simple commands with increased time;Follows simple commands with repetition   Additional Comments Pt required increased repetition with inconsistent carryover.   Bed Mobility   Bed mobility Tested   Supine to Sit Minimum assist ;1 person assist;Verbal cues   Sit to Supine Not tested  (up in recliner)   Additional comments Pt able to advance BLE towards edge of bed to prepare for transfer. Writer assisted at upper trunk to faiclitate pt into upright. Limited core activation d/t pain.   Transfers   Transfers Tested   Sit to Stand Minimum ;1 person assist   Stand to sit Minimum ;1 person assist   Transfer Assistive Device rolling walker   Additional comments Pt requires increased  VCs for set-up. Cues for forward weight shift and assist at trunk to complete. PT completed from egde of bed and hallway chair. Noted improvement with increased cues to reinforce forward weight shift.   Mobility   Mobility Tested   Gait Pattern Decreased cadence;Decreased R step length;Decreased R step height;Decreased L step length;Decreased L step height  (lack of heel strike)   Ambulation Assist Contact guard;1 person assist   Ambulation Distance (Feet) 1x53ft, 1x181ft   Ambulation Assistive Device rolling walker   Stairs Assistance Minimal;1 person assist   Stair Management Technique Two rails;Step to pattern;Forwards   Number of Stairs 5 step ups bilaterally   Additional comments Pt ambulated at slow gait speed and required VCs for positioning within RW and heel strike. CGA provided at trunk for stability and prevent anteriorly displaced COM. PT required minAx1 on stairs to assist up. Pt relied on BUE to complete.   Balance   Balance Tested   Sitting - Static Standby assist;Unsupported   Standing - Static Contact guard;Supported   Standing - Dynamic Min assist;Supported   PT AM-PAC Mobility   Turning over in bed? 1   Sitting down on and standing up from a chair with arms? 1   Moving from lying on back to sitting on the side of the bed? 1   Moving to and from a bed to a chair? 3   Need to walk in hospital room?  3   Climbing 3 - 5 steps with a railing? 3   Total Raw Score 12   Standardized Score 35.33   CMS 1-100% Score 69   Medicare Functional Limit Modifiers CL  60 - < 80% impaired, limited, restricted   Assessment   Brief Assessment Remains appropriate for skilled therapy   Problem List Pain contributing to impairment;Impaired functional mobility   Patient / Family Goal rehab   Plan/Recommendation   Treatment Interventions Restorative PT   PT Frequency 2-4x/wk   Mobility Recommendations 1A with RW, may benefit from chair follow in hallway. Pt able to ambulate small loop with rest break as needed. Please assist  with ambulation 3x/day   Discharge Recommendations Skilled Nursing Facility Rehab   Assessment/Recommendations Reviewed With: Patient;Nursing   Next PT Visit progressive ambulation, transfers   Time Calculation   PT Timed Codes 25   PT Untimed Codes 0   PT Unbilled Time 0   PT Total Treatment 25   Plan and Onset date   Plan of Care Date 05/28/17   Treatment Start Date 04/21/17   PT Charges   $PT [facility] Charges Gait Training - code 0208 (54min);Functional Training - code 0239 (40min)     Rodena Piety, PT, DPT  Pager [phone]

## 2017-05-28 NOTE — Progress Notes (Signed)
Palliative Care Progress Note    HPI: [name] w/ PMH fibromyalgia, pancreatic CA s/p Whipple for 'cure' 03/29/17; s/p ICU w/ intub for resp failure & septic shock, weaned from pressors and extubated to [facility]NC 2/17 w/ IR drainage bili fluid collection 2/19, ID following & receiving IV abx (caspo ends 3/2). Palliative following for symptoms, started methadone 2/19, weaned dose 2/23 d/t concern for sedation. S/p significant constipation requiring bowel clean out (s/p enema & methylnaltrexone). Unfortunately course complicated by intraabdominal abscesses, s/p IR for placement of perc drains & currently continues on IV abx; s/p TPN w/ poor po intake. Trial ritalin 1x/d on 3/7 w/ fair effect, much better result noted 3/8 w/ 2x/d dosing, more interactive when ritalin restarted 3/21. Pt w/ ongoing poor po intake, now tolerating full cyclic fds via dubhoff fdg tube & anticipated to get PEG. Goal dispo SNF Rehab.    Subjective: "It's been a busy day. I guess they weren't able to do put the tube in my stomach but I'm happy to have the line out of my neck"    Objective: chart reviewed, Spoke w/ Tobey Bride [phone]) this AM and review pt's med list especially drug to drug interactions - she offered that biggest offender likely contributing to hallucinations was Elavil, reviewed that pt has been on this since Aug but in review she is also on Cymbalta started more recently and has less SE profile than the Elavil; also reviewed that literature does not support ritalin as primary cause of hallucinations though no harm in reducing ritalin modestly as done yesterday from 5 mg 2x/d to 5/2.5 for total 7.5 mg/d; [name] also offered anticipatory guidance that when pt ready to come off fluconazole she might experience less effective pain relief w/ methadone - discussed that pt's pain was primary abd and her abscesses are resolving so ideally she will not need as much pain meds over time; relayed message from Phmd to covering provider and  later discussed w/ pt/dtr and Dr Ron Agee when visited this afternoon; Dr Ron Agee assessed pt's mid back pain - noted redness r/t pt bumping up against wall in BR (witnessed by dtr [name]) and pt c/o LUE pain though no overt abnormality or h/o trauma - discussed trying lidocaine patch on these sites    Palliative Care ROS:  Pain   Mild  Nausea   None  Anorexia   Moderate  Anxiety   Mild  Depression   Mild  Shortness of Breath   None  Tiredness/Fatigue   Mild  Drowsiness/Sleepiness   Mild  Airway Secretions   no  Constipation   no  Unable to Respond   no  Delirium   no   Last stool 3/25    Physical Examination:   BP: (100-130)/(58-82)   Temp:  [36.3 C (97.3 F)-36.4 C (97.5 F)]   Temp src: Temporal (03/26 1550)  Heart Rate:  [93-108]   Resp:  [16-20]   SpO2:  [91 %-96 %]   Weight:  [79.8 kg (175 lb 14.4 oz)]   Gen appearance: [name], sitting up in recliner, no acute distress, tired and c/o mid back and LUE pain  Lungs: easy resp, RA  Heart: reg  Abd: large, distended, stool yesterday  Extrem: mod LE edema  Skin: ruddy LEs, warm, dry, s/p bili and L neck CVC removed today  Neuro:  A+O x 3, at baseline ambulates short distances w/ walker, OOB to chair stand & pivot w/ walker; pt requires standby assist (h/o  recent fall in BR when didn't have walker for support)    Assessment/Plan: [name] w/ pancreatic CA, s/p Whipple in Jan. s/p TPN, s/p perc drains now removed & cont's on IV abx for resolving abd abscesses. Pt encouraged to take PO w/ poor appetite though is tolerating cyclic TFs, anticipate getting PEG soon. 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    Pain/dyspnea  Acetaminophen 1 gm po TID  Methadone 5/7.5/7.5 mg po TID (total 20 mg/d, last increased 3/11)  Dilaudid 2 mg po Q 4 hrs prn (x 1 on 3/25, x 1 on 3/26)  Lidocaine patch daily x 2 (mid back, LUE)  Encourage incentive spirometry prn     Anxiety/agitation/nausea/fatigue  Amitriptyline 25 mg po QHS -stop  Duloxetine 20 mg  po daily  Reglan 10 mg po 4x/d (AC & QHS)  Pantoprazole 40 mg po BID  Calcium carbonate 1 gm po BID prn (x 1 on 3/25)  Lorazepam 0.5 mg po Q 6 hrs prn (last x 1 on 3/7 QHS)  Ondansetron 4 mg IV Q 6 hrs prn (last x 1 on 3/10)  Ritalin 5 mg po at 8 am and 2.5 mg at 12 pm (restarted 3/21 w/ + effect, reduced dose 3/26) unlikely ritalin is cause of mild confusion or appetite suppression    Prevention of constipation, last stool 3/25  Colace 200 mg po BID  Miralax 17 gm po BID  Senna 2 tab po BID  Bisacodyl supp pr daily     GOC/prognosis  Full code  Dtr Rod Mae is HCP  Dispo plans now for SNF Rehab (before dispo pt will need to either be taking enough PO or will need PEG fdg tube)    Pt case discussed w/ RN, Dr Jinny Blossom Phmd and covering providers. Appreciate helpful info received from [facility] which was relayed to primary team.     We will continue to follow along, please call if questions/concerns.    Total Time Spent 45 minutes: >50% of time was spent in counseling and/or coordination of care.     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    Hypothyroidism E03.9    Pancreatic cancer C25.9    Anemia D64.9     hx of Pulmonary embolism I26.99    Depression F32.9    Sepsis A41.9       No Known Allergies (drug, envir, food or latex)    Scheduled Meds:    lidocaine  2 patch Transdermal Q24H    daptomycin IV  8 mg/kg Intravenous Q24H    methylphenidate  2.5 mg Oral BID    methadone  7.5 mg Oral BID    methadone  5 mg Oral Daily    fluconazole  800 mg Oral Daily    insulin glargine  6 Units Subcutaneous Nightly    pantoprazole  40 mg Oral BID AC    PROSOURCE NO CARB  30 mL Oral BID WC    ertapenem  1,000 mg Intravenous Q24H     senna  2 tablet Oral 2 times per day    bisacodyl  10 mg Rectal Daily    enoxaparin  40 mg Subcutaneous Daily    DULoxetine  20 mg Oral  Daily    metoclopramide  10 mg Oral 4x Daily AC & HS    docusate sodium  200 mg Oral 2 times per day    polyethylene glycol  17 g Oral 2 times per day    acetaminophen  1,000 mg Oral Q8H    levothyroxine  137 mcg Oral Daily       Continuous Infusions:    lactated ringers 75 mL/hr (05/28/17 1401)       PRN Meds:  HYDROmorphone, naloxone, sodium chloride, dextrose, calcium carbonate, heparin lock flush, ondansetron

## 2017-05-28 NOTE — Progress Notes (Signed)
Assumed care @ 1500. VSS throughout shift. Bili drain removed by providers. 2X dressings completely saturated within 2 hours with bilious drainage. Provider Merla Riches, NP notified. Site pouched. Will continue to monitor and assess.     Lamar Sprinkles, RN

## 2017-05-28 NOTE — Progress Notes (Addendum)
Surgical Oncology/Hepatobiliary Surgery Progress Note     LOS: 60 days     Subjective:  In bed, resting comfortably. Took in only 120 po yesterday. No nausea/vomiting. Passing gas, having bowel movements.     KUB to evaluate DHT placement- enteric.    Objective:  Vitals Sign Ranges for Past 24 Hours:  BP: (100-130)/(56-80)   Temp:  [36.3 C (97.3 F)-36.6 C (97.9 F)]   Temp src: Temporal (03/26 0336)  Heart Rate:  [95-108]   Resp:  [16]   SpO2:  [92 %-94 %]       Physical Exam:  General Appearance: in NAD, speaking slowly/quietly  Cardiac: regular rate  Respiratory: non-labored breathing on room air  Abdomen: Soft, minimally tender to palpation, distended, mild ecchymosis, biliary tube clamped  Extremities: warm    Labs:    CBC:    Recent Labs  Lab 05/27/17  2350 05/27/17  0019 05/26/17  0002 05/24/17  2350 05/24/17  0805 05/23/17  0030   WBC 11.5* 10.9* 11.2* 9.8 9.8 13.0*   Hemoglobin 6.9* 6.9* 7.3* 7.0* 7.3* 8.3*   Hematocrit 23* 23* 23* 23* 23* 26*   Platelets 544* 526* 534* 528* 533* 102*       Metabolic Panel:    Recent Labs  Lab 05/27/17  0019 05/24/17  2350 05/23/17  0030   Sodium 136 133 132*   Potassium 4.2 4.5 4.8   Chloride 97 95* 92*   CO2 _0 UN _1 Creatinine 0.65 0.67 0.64   Glucose 117* 109* 89   Calcium 8.6 8.8 8.9   Magnesium 2.0 1.9 1.8   Phosphorus 4.8* 5.1* 5.1*          Assessment:  72 y.o. female with h/o recent PE and pancreatic cancer POD # 60  status post whipple procedure complicated by delayed gastric emptying.  NGT replaced on 04/04/17. UGI on 04/12/17 concerning for obstruction just beyond the [place] in the efferent limb.  Course complicated on 5/85/27 by rising LFTs and sepsis with CT concerning for cholangitis given biliary tree obstruction and PV compression due to hematoma and afferent limb distention in setting of PE treatment. Is s/p IR PTC on 04/16/17 and IR percutaneous aspiration of anterior abdominal fluid collection on 04/24/17. IR LUQ perc drain placement 3/1. IR  anterior abdominal 34f perc drain placement 3/3. IR anterior perc drain into left hepatic collection 3/6.    Plan:  - Regular diet + ensures  - Continue to encourage PO/enteral nutrition, TFs to goal - now cycled at 12 hours   - Aggressive bowel regimen  - CT scan 3/21 with intra-abd collections, new portal venous collection. However, patient remains afebrile, clinically stable, WBCs downtrending, so do not plan to drain at this time. Continue ertapenem/caspo/dapto, appreciate ID recs.  - Continue capped biliary drain  - SCDs, Lovenox for VTE ppx (hold therapeutic anticoagulation)   - Dispo: Pending clinical course, adequate nutrition/PO intake    NCristie Hem MD  05/28/2017     6:30 AM   General Surgery Resident    HPB-GI Attending Addendum:   I personally examined the patient, reviewed the notes, and discussed the plan of care with the residents and patient. Agree with detailed resident's note. Please see the note above for details of history, exam, labs, assessment/plan which reflect my input.     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 Abdominal abscess on imaging.      Seen and evaluated on 05/28/17 at 5pm, note delayed.     Hyperglycemia with insulin sliding scale and recurrent need for insulin. continue   Post operative anemia. Stable. No need for transfusion  Bilateral pleural effusions. Stable. No oxygen requirement.   Pulmonary embolism. Present on admission. No evidence of worsening.  Will restart Lovenox.   Hypoalbuminemia. Moderate malnutrition. Tube feedings at goal. Encourage oral intake.    inadequate oral intake alone. Requesting PEG tube.   Depression. Encouragement provided. Slightly improved. Ritalin helpful. Hallucinations.   Multiple intraabdominal abscesses with Enterobacter cloacae complex bacteremia and fungus.   Small collections. Remaining collections are difficult to reach  and resolving. Continue antibiotics  Constipation . Aggressive regimen.   Physical debility. Requires rehabilitation setting.      Delano Metz, MD, FACS  Hepatobiliary, Pancreas & GI Surgery

## 2017-05-28 NOTE — Plan of Care (Signed)
Impaired Ambulation    • STG - IMPROVE AMBULATION Progressing towards therapy goal; extend goal x 5 more visits        Impaired Bed Mobility    • STG - IMPROVE BED MOBILITY Progressing towards therapy goal; extend goal x 5 more visits        Impaired Transfers    • STG - IMPROVE TRANSFERS Progressing towards therapy goal; extend goal x 5 more visits

## 2017-05-28 NOTE — Progress Notes (Signed)
Brief Progress Note    Biliary tube pulled without issues.    Cherylann Banas, MD  05/28/2017 2:12 PM

## 2017-05-29 DIAGNOSIS — R410 Disorientation, unspecified: Secondary | ICD-10-CM

## 2017-05-29 LAB — COMPREHENSIVE METABOLIC PANEL
ALT: 45 U/L — ABNORMAL HIGH (ref 0–35)
AST: 53 U/L — ABNORMAL HIGH (ref 0–35)
Albumin: 2.3 g/dL — ABNORMAL LOW (ref 3.5–5.2)
Alk Phos: 417 U/L — ABNORMAL HIGH (ref 35–105)
Anion Gap: 12 (ref 7–16)
Bilirubin,Total: <0.2 mg/dL (ref 0.0–1.2)
CO2: 27 mmol/L (ref 20–28)
Calcium: 8.5 mg/dL — ABNORMAL LOW (ref 8.6–10.2)
Chloride: 95 mmol/L — ABNORMAL LOW (ref 96–108)
Creatinine: 0.71 mg/dL (ref 0.51–0.95)
GFR,Black: 99 *
GFR,Caucasian: 86 *
Glucose: 111 mg/dL — ABNORMAL HIGH (ref 60–99)
Lab: 16 mg/dL (ref 6–20)
Potassium: 4.3 mmol/L (ref 3.3–5.1)
Sodium: 134 mmol/L (ref 133–145)
Total Protein: 6.7 g/dL (ref 6.3–7.7)

## 2017-05-29 LAB — CBC AND DIFFERENTIAL
Baso # K/uL: 0.1 10*3/uL (ref 0.0–0.1)
Basophil %: 0.6 %
Eos # K/uL: 0.4 10*3/uL (ref 0.0–0.4)
Eosinophil %: 4.1 %
Hematocrit: 22 % — ABNORMAL LOW (ref 34–45)
Hemoglobin: 6.9 g/dL — ABNORMAL LOW (ref 11.2–15.7)
IMM Granulocytes #: 0.1 10*3/uL
IMM Granulocytes: 0.7 %
Lymph # K/uL: 1.2 10*3/uL (ref 1.2–3.7)
Lymphocyte %: 11.7 %
MCH: 29 pg/cell (ref 26–32)
MCHC: 31 g/dL — ABNORMAL LOW (ref 32–36)
MCV: 93 fL (ref 79–95)
Mono # K/uL: 0.9 10*3/uL (ref 0.2–0.9)
Monocyte %: 9.6 %
Neut # K/uL: 7.2 10*3/uL — ABNORMAL HIGH (ref 1.6–6.1)
Nucl RBC # K/uL: 0 10*3/uL (ref 0.0–0.0)
Nucl RBC %: 0.1 /100 WBC (ref 0.0–0.2)
Platelets: 577 10*3/uL — ABNORMAL HIGH (ref 160–370)
RBC: 2.4 MIL/uL — ABNORMAL LOW (ref 3.9–5.2)
RDW: 17.2 % — ABNORMAL HIGH (ref 11.7–14.4)
Seg Neut %: 73.3 %
WBC: 9.8 10*3/uL (ref 4.0–10.0)

## 2017-05-29 LAB — PHOSPHORUS: Phosphorus: 5.8 mg/dL — ABNORMAL HIGH (ref 2.7–4.5)

## 2017-05-29 LAB — CK: CK: 82 U/L (ref 26–192)

## 2017-05-29 LAB — POCT GLUCOSE: Glucose POCT: 126 mg/dL — ABNORMAL HIGH (ref 60–99)

## 2017-05-29 LAB — FUNGUS CULTURE

## 2017-05-29 LAB — MAGNESIUM: Magnesium: 1.8 mg/dL (ref 1.6–2.5)

## 2017-05-29 MED ORDER — MAGNESIUM SULFATE 2 GM IN 50 ML *WRAPPED*
2000.0000 mg | Freq: Once | INTRAVENOUS | Status: AC
Start: 2017-05-29 — End: 2017-05-29
  Administered 2017-05-29: 2000 mg via INTRAVENOUS
  Filled 2017-05-29: qty 50

## 2017-05-29 NOTE — Progress Notes (Addendum)
Surgical Oncology/Hepatobiliary Surgery Progress Note     LOS: 61 days     Subjective:  Biliary drain pulled yesterday, site with bilious drainage.  In bed, resting comfortably. Took in only 630 po yesterday. No nausea/vomiting. Passing gas, having bowel movements.     Objective:  Vitals Sign Ranges for Past 24 Hours:  BP: (100-120)/(58-82)   Temp:  [36.1 C (97 F)-36.4 C (97.5 F)]   Temp src: Temporal (03/27 0439)  Heart Rate:  [92-104]   Resp:  [16-20]   SpO2:  [91 %-98 %]   Weight:  [79.8 kg (175 lb 14.4 oz)]       Physical Exam:  General Appearance: in NAD, speaking slowly/quietly  Cardiac: regular rate  Respiratory: non-labored breathing on room air  Abdomen: Soft, minimally tender to palpation, distended, mild ecchymosis, biliary tube site with pouch overlying it   Extremities: warm    Labs:    CBC:    Recent Labs  Lab 05/29/17  0019 05/27/17  2350 05/27/17  0019 05/26/17  0002 05/24/17  2350 05/24/17  0805   WBC 9.8 11.5* 10.9* 11.2* 9.8 9.8   Hemoglobin 6.9* 6.9* 6.9* 7.3* 7.0* 7.3*   Hematocrit 22* 23* 23* 23* 23* 23*   Platelets 577* 544* 526* 534* 528* 981*       Metabolic Panel:    Recent Labs  Lab 05/29/17  0019 05/27/17  0019 05/24/17  2350 05/23/17  0030   Sodium 134 136 133 132*   Potassium 4.3 4.2 4.5 4.8   Chloride 95* 97 95* 92*   CO2 _0 UN _1 Creatinine 0.71 0.65 0.67 0.64   Glucose 111* 117* 109* 89   Calcium 8.5* 8.6 8.8 8.9   Magnesium 1.8 2.0 1.9 1.8   Phosphorus 5.8* 4.8* 5.1* 5.1*          Assessment:  72 y.o. female with h/o recent PE and pancreatic cancer POD # 61  status post whipple procedure complicated by delayed gastric emptying.  NGT replaced on 04/04/17. UGI on 04/12/17 concerning for obstruction just beyond the [place] in the efferent limb.  Course complicated on 1/91/47 by rising LFTs and sepsis with CT concerning for cholangitis given biliary tree obstruction and PV compression due to hematoma and afferent limb distention in setting of PE treatment. Is s/p IR  PTC on 04/16/17 and IR percutaneous aspiration of anterior abdominal fluid collection on 04/24/17. IR LUQ perc drain placement 3/1. IR anterior abdominal 42f perc drain placement 3/3. IR anterior perc drain into left hepatic collection 3/6.    Plan:  - GI to evaluate for possible PEG  - Regular diet + ensures  - Continue to encourage PO/enteral nutrition, TFs to goal - now cycled at 12 hours   - Aggressive bowel regimen  - CT scan 3/21 with intra-abd collections, new portal venous collection. However, patient remains afebrile, clinically stable, WBCs downtrending, so do not plan to drain at this time. Continue ertapenem/caspo/dapto, appreciate ID recs.  - Continue capped biliary drain  - SCDs, Lovenox for VTE ppx (hold therapeutic anticoagulation)   - Dispo: Pending clinical course, adequate nutrition/PO intake    NCristie Hem MD  05/29/2017     7:03 AM   General Surgery Resident          HPB-GI Attending Addendum:   I personally examined the patient, reviewed the notes, and discussed the plan of care with the residents and patient. Agree  with detailed resident's note. Please see the note above for details of history, exam, labs, assessment/plan which reflect my input.         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 Abdominal abscess on imaging.      Hyperglycemia with insulin sliding scale and recurrent need for insulin. continue   Post operative anemia. Stable.  Bilateral pleural effusions. Stable. No oxygen requirement.   Pulmonary embolism. Present on admission. No evidence of worsening.  will restart Lovenox.   Hypoalbuminemia. Moderate malnutrition. Tube feedings at goal. Encourage oral intake.   Depression. Encouragement provided. Slightly improved. Ritalin helpful. Hallucinations. Working on changing medications  Leukocytosis. Improved.  Multiple intraabdominal abscesses with Enterobacter cloacae  complex bacteremia and fungus.   Small collections. Remaining collections are difficult to reach and are smaller.   Antibiotics until 3/27  Constipation . Aggressive regimen.         Delano Metz, MD, FACS  Hepatobiliary, Pancreas & GI Surgery

## 2017-05-29 NOTE — Progress Notes (Signed)
Gastroenterology Consult Follow up    Admit Date:  03/29/2017    Interval Events:   72 y.o. woman with h/o recent PE and pancreatic cancer POD # 61 status post whipple procedure (03/29/2017). GI was initially consulted on 04/13/2017 for EGD for dilation of GOO. Our plan was to attempt the procedure but course complicated on 5/42/70 by rising LFTs and septic shock s/p ICU w/ intub for resp failure, with CT concerning for cholangitis given biliary tree obstruction and PV compression due to hematoma and afferent limb distention in setting of PE treatment, weaned from pressors and extubated to HFNC 2/17. Palliative following for symptoms, started methadone 2/19, weaned dose 2/23 d/t concern for sedation. S/p significant constipation requiring bowel clean out (s/p enema & methylnaltrexone). Unfortunately course complicated by intraabdominal abscesses, Is s/p IR PTC on 04/16/17 and IR percutaneous aspiration of anterior abdominal fluid collection on 04/24/17, IR LUQ perc drain placement 3/1, IR anterior abdominal 50f perc drain placement 3/3, IR anterior perc drain into left hepatic collection 3/6 & currently continues on IV abx; Trial ritalin 1x/d on 3/7 w/ fair effect, much better result noted 3/8 w/ 2x/d dosing, more interactive when ritalin restarted 3/21.     In addition course is complicated by delayed gastric emptying. NGT replaced on 04/04/17. UGI on 04/12/17 concerning for obstruction just beyond the GHammonin the efferent limb. S/p TPN w/ poor po intake. Pt w/ ongoing poor po intake, now tolerating full cyclic fds via dubhoff fdg tube & anticipated to get PEG. Goal dispo SNF Rehab.    Subjective:   Patient is doing ok. She has abdominal pain but it has been unchanged for several weeks. She is tolerating feedings via dobhuff .     Medications:    Scheduled Meds:   lidocaine  2 patch Transdermal Q24H    daptomycin IV  8 mg/kg Intravenous Q24H    methylphenidate  2.5 mg Oral BID    methadone  7.5 mg Oral BID    methadone   5 mg Oral Daily    fluconazole  800 mg Oral Daily    insulin glargine  6 Units Subcutaneous Nightly    pantoprazole  40 mg Oral BID AC    PROSOURCE NO CARB  30 mL Oral BID WC    ertapenem  1,000 mg Intravenous Q24H    senna  2 tablet Oral 2 times per day    bisacodyl  10 mg Rectal Daily    enoxaparin  40 mg Subcutaneous Daily    DULoxetine  20 mg Oral Daily    metoclopramide  10 mg Oral 4x Daily AC & HS    docusate sodium  200 mg Oral 2 times per day    polyethylene glycol  17 g Oral 2 times per day    acetaminophen  1,000 mg Oral Q8H    levothyroxine  137 mcg Oral Daily     Continuous Infusions:   lactated ringers 75 mL/hr (05/29/17 0619)     PRN Meds:.HYDROmorphone, naloxone, sodium chloride, dextrose, calcium carbonate, heparin lock flush, ondansetron    Physical examination:      BP (P) 108/60 (BP Location: Left arm)    Pulse (P) 98    Temp (P) 36.2 C (97.2 F) (Temporal)    Resp (P) 16    Ht 170.2 cm (_0 )    Wt 79.8 kg (175 lb 14.4 oz)    SpO2 (P) 92%    BMI 27.54 kg/m   General appearance: awake,  NAD, chronically sick  Cardiovascular: regular rate and rhythm, S1, S2 normal  Respiratory CTAB  Gastrointestinal: Normoactive bowel sounds. Abdomen soft, diffusely tender mostly at LUQ  Skin: surgical scars  Neurological: grossly normal    LAB DATA:         Lab results: 05/29/17  0019 05/27/17  2350 05/27/17  0019 05/26/17  0002 05/24/17  2350   WBC 9.8 11.5* 10.9* 11.2* 9.8   Hemoglobin 6.9* 6.9* 6.9* 7.3* 7.0*   Hematocrit 22* 23* 23* 23* 23*   RBC 2.4* 2.4* 2.4* 2.5* 2.5*   Platelets 577* 544* 526* 534* 528*           Lab results: 05/29/17  0019 05/27/17  0019 05/24/17  2350 05/23/17  0030 05/21/17  0420   Sodium 134 136 133 132* 132*   Potassium 4.3 4.2 4.5 4.8 4.4   Chloride 95* 97 95* 92* 93*   CO2 _0 UN _1 Creatinine 0.71 0.65 0.67 0.64 0.62   GFR,Caucasian 86 90 89 90 91   GFR,Black 99 103 102 104 105   Glucose 111* 117* 109* 89 105*   Calcium 8.5* 8.6 8.8 8.9  8.5*   Total Protein 6.7 7.1 7.1 7.7 6.6   Albumin 2.3* 2.4* 2.4* 2.7* 2.3*   ALT 45* 47* 49* 78* 64*   AST 53* 56* 51* 84* 57*   Alk Phos 417* 396* 428* 568* 467*   Bilirubin,Total <0.2 <0.2 0.2 0.2 <0.2           Lab results: 05/27/17  0019   INR 1.4*         IMAGING:   * Abdomen Standard Ap Single View/kub    Result Date: 05/26/2017  Enteric tube placement, as described. END OF IMPRESSION [facility] submits this DICOM format image data and final report to the [facility], an independent secure electronic health information exchange, on a reciprocally searchable basis (with patient authorization) for a minimum of 12 months after exam date.    CT abdomen and pelvis 05/14/2017:  Decreased size of the fluid collection along the anterior margin of the left hepatic lobe status post percutaneous drain with small amount of residual fluid and gas in this location.  Decreased size of the fluid collections in the left upper quadrant. No residual fluid in the ventral peritoneum at the site of drain.  Unchanged fluid collection adjacent to the main portal vein which results in focal luminal narrowing of the main portal vein.  Unchanged periductal edema and focal fluid collection along the right internal/external biliary drain. Additional unchanged low-attenuation liver lesion/fluid collection in segment 6 measuring 3 cm, may represent abscess, biloma or metastasis.  Unchanged soft tissue nodule adjacent to the rectum, may represent tumor deposit.  Unchanged moderate left pleural effusion with left lower lobe atelectasis. Unchanged consolidation in the right lower lobe with bronchiectasis.      ASSESSMENT/RECOMMENDATIONS:   72 y.o. woman with h/o recent PE and pancreatic cancer status post whipple procedure with complicated hospital course. GI consulted for PEG.     A month ago there was a concern for GOO but patient has been tolerated tube feeds with a tip that is most likely in the stomach...    Anyhow, patient with  multiple intraabdominal abscess that based on last CT done on 3/21 s/p percutaneous drainage are increasing in size. Several of this multiloculated fluid collections are at the left upper quadrant.  IR evaluated the patient yesterday for G-tube placement but no window was found.     Unfortunately patient has multiple contraindications for a PEG. A big one is her multiple intraabdominal multiloculated fluid collections, mostly at LUQ. Risks outweigh benefits, not a candidate. GI does not plan to attempt EGD in this patient.     GI will sign-off at this time. Please do not hesitate to contact GI on call for any questions or clinical concerns.    Fran Lowes, MD  PGY-4  Gastroenterology and Hepatology  [facility] [place] [facility]

## 2017-05-29 NOTE — Progress Notes (Signed)
Palliative Care Progress Note    HPI: [name] w/ PMH fibromyalgia, pancreatic CA s/p Whipple for 'cure' 03/29/17; s/p ICU w/ intub for resp failure & septic shock, weaned from pressors and extubated to HFNC 2/17 w/ IR drainage bili fluid collection 2/19, ID following & receiving IV abx (caspo ends 3/2). Palliative following for symptoms, started methadone 2/19, weaned dose 2/23 d/t concern for sedation. S/p significant constipation requiring bowel clean out (s/p enema & methylnaltrexone). Unfortunately course complicated by intraabdominal abscesses, s/p IR for placement of perc drains & currently continues on IV abx; s/p TPN w/ poor po intake. Trial ritalin 1x/d on 3/7 w/ fair effect, much better result noted 3/8 w/ 2x/d dosing, more interactive when ritalin restarted 3/21. Pt w/ ongoing poor po intake, now tolerating full cyclic fds via dubhoff fdg tube & anticipated to get PEG. Goal dispo SNF Rehab.    Subjective: "I feel ok, I ate meatballs and spaghetti yesterday. My pain is ok. I don't think I've had any hallucinations today"    Objective: chart reviewed, pt case discussed w/ RN who reports pt had very mild confusion earlier talking about getting a coupon to order food from [employer], otherwise pt has been alert and appropriate today; visited w/ pt & dtr [name] this afternoon, pt in good spirits, reports pain controlled and no hallucinations; dtr concerned w/ pt's swollen, red, dry flaky skin on BLEs - reviewed 3rd spacing w/ malnutrition and need for gentle moisturizing to prevent skin tears    Palliative Care ROS:  Pain   Mild  Nausea   None  Anorexia   Moderate  Anxiety   Mild  Depression   Mild  Shortness of Breath   None  Tiredness/Fatigue   Mild  Drowsiness/Sleepiness   Mild  Airway Secretions   no  Constipation   no  Unable to Respond   no  Delirium   no   Last stool 3/27    Physical Examination:   BP: (108-128)/(60-72)   Temp:  [36.1 C (97 F)-36.8 C (98.2 F)]   Temp src: Temporal (03/27  1706)  Heart Rate:  [61-99]   Resp:  [16]   SpO2:  [91 %-99 %]   Gen appearance: [name], sitting up in bed, no acute distress, reporting pain currently controlled  Lungs: easy resp, RA  Heart: reg  Abd: large, distended, stool today  Extrem: mod LE edema  Skin: ruddy LEs, warm, dry, s/p bili drain removed now w/ pouch covering for any scant leakage  Neuro:  A+O x 3, at baseline ambulates short distances w/ walker, OOB to chair stand & pivot w/ walker; pt requires standby assist     Assessment/Plan: [name] w/ pancreatic CA, s/p Whipple in Jan. s/p TPN, s/p perc drains now removed & cont's on IV abx for resolving abd abscesses. Pt encouraged to take PO w/ poor appetite though is tolerating cyclic TFs, anticipate getting PEG soon. 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    Pain/dyspnea  Acetaminophen 1 gm po TID  Methadone 5/7.5/7.5 mg po TID (total 20 mg/d, last increased 3/11)  Dilaudid 2 mg po Q 4 hrs prn (x 1 on 3/25, x 1 on 3/26)  Lidocaine patch daily x 2 (mid back, LUE)  Encourage incentive spirometry prn     Anxiety/agitation/nausea/fatigue  Duloxetine 20 mg po daily  Reglan 10 mg po 4x/d (AC & QHS)  Pantoprazole 40 mg po BID  Calcium carbonate  1 gm po BID prn (x 1 on 3/25)  Lorazepam 0.5 mg po Q 6 hrs prn (last x 1 on 3/7 QHS)  Ondansetron 4 mg IV Q 6 hrs prn (last x 1 on 3/10)  Ritalin 2.5 mg po at 8 am and 2.5 mg at 12 pm (reduced dose 3/26) per Phmd unlikely ritalin is cause of mild confusion or appetite suppression    Prevention of constipation, last stool 3/27  Colace 200 mg po BID  Miralax 17 gm po BID  Senna 2 tab po BID  Bisacodyl supp pr daily     Dry flaky edematous Skin  Please order Eucerin prn for BLEs    GOC/prognosis  Full code  Dtr Rod Mae is HCP  Dispo plans now for SNF Rehab after PEG fdg tube placed    Pt case discussed w/ RN and covering provider.     We will continue to follow along, please call if questions/concerns or if pt needs to be  seen tomorrow.    Total Time Spent 35 minutes: >50% of time was spent in counseling and/or coordination of care.     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    Hypothyroidism E03.9    Pancreatic cancer C25.9    Anemia D64.9     hx of Pulmonary embolism I26.99    Depression F32.9    Sepsis A41.9       No Known Allergies (drug, envir, food or latex)    Scheduled Meds:    lidocaine  2 patch Transdermal Q24H    daptomycin IV  8 mg/kg Intravenous Q24H    methylphenidate  2.5 mg Oral BID    methadone  7.5 mg Oral BID    methadone  5 mg Oral Daily    fluconazole  800 mg Oral Daily    insulin glargine  6 Units Subcutaneous Nightly    pantoprazole  40 mg Oral BID AC    PROSOURCE NO CARB  30 mL Oral BID WC    ertapenem  1,000 mg Intravenous Q24H    senna  2 tablet Oral 2 times per day    bisacodyl  10 mg Rectal Daily    enoxaparin  40 mg Subcutaneous Daily    DULoxetine  20 mg Oral Daily    metoclopramide  10 mg Oral 4x Daily AC & HS    docusate sodium  200 mg Oral 2 times per day    polyethylene glycol  17 g Oral 2 times per day    acetaminophen  1,000 mg Oral Q8H    levothyroxine  137 mcg Oral Daily       Continuous Infusions:    lactated ringers 75 mL/hr (05/29/17 1722)       PRN Meds:  HYDROmorphone, naloxone, sodium chloride, dextrose, calcium carbonate, heparin lock flush, ondansetron

## 2017-05-30 LAB — CBC AND DIFFERENTIAL
Baso # K/uL: 0.1 10*3/uL (ref 0.0–0.1)
Basophil %: 0.5 %
Eos # K/uL: 0.4 10*3/uL (ref 0.0–0.4)
Eosinophil %: 3.8 %
Hematocrit: 22 % — ABNORMAL LOW (ref 34–45)
Hemoglobin: 6.8 g/dL — ABNORMAL LOW (ref 11.2–15.7)
IMM Granulocytes #: 0.1 10*3/uL
IMM Granulocytes: 0.5 %
Lymph # K/uL: 1.1 10*3/uL — ABNORMAL LOW (ref 1.2–3.7)
Lymphocyte %: 12 %
MCH: 29 pg/cell (ref 26–32)
MCHC: 31 g/dL — ABNORMAL LOW (ref 32–36)
MCV: 93 fL (ref 79–95)
Mono # K/uL: 0.9 10*3/uL (ref 0.2–0.9)
Monocyte %: 9.8 %
Neut # K/uL: 7 10*3/uL — ABNORMAL HIGH (ref 1.6–6.1)
Nucl RBC # K/uL: 0 10*3/uL (ref 0.0–0.0)
Nucl RBC %: 0 /100 WBC (ref 0.0–0.2)
Platelets: 551 10*3/uL — ABNORMAL HIGH (ref 160–370)
RBC: 2.4 MIL/uL — ABNORMAL LOW (ref 3.9–5.2)
RDW: 17.4 % — ABNORMAL HIGH (ref 11.7–14.4)
Seg Neut %: 73.4 %
WBC: 9.5 10*3/uL (ref 4.0–10.0)

## 2017-05-30 LAB — POCT GLUCOSE: Glucose POCT: 115 mg/dL — ABNORMAL HIGH (ref 60–99)

## 2017-05-30 MED ORDER — LINEZOLID 600 MG PO TABS *I*
600.0000 mg | ORAL_TABLET | Freq: Two times a day (BID) | ORAL | Status: DC
Start: 2017-05-30 — End: 2017-05-30

## 2017-05-30 MED ORDER — SULFAMETHOXAZOLE-TRIMETHOPRIM 400-80 MG PO TABS *I*
1.0000 | ORAL_TABLET | Freq: Two times a day (BID) | ORAL | Status: DC
Start: 2017-05-30 — End: 2017-06-06
  Administered 2017-05-30 – 2017-06-05 (×13): 1 via ORAL
  Filled 2017-05-30 (×14): qty 1

## 2017-05-30 MED ORDER — CIPROFLOXACIN HCL 500 MG PO TABS *I*
500.0000 mg | ORAL_TABLET | Freq: Two times a day (BID) | ORAL | Status: DC
Start: 2017-05-30 — End: 2017-05-31
  Administered 2017-05-30 – 2017-05-31 (×2): 500 mg via ORAL
  Filled 2017-05-30 (×2): qty 1

## 2017-05-30 NOTE — Consults (Addendum)
Thoracic Surgery Consult Note:    Consult requested by: Dr. Ron Agee  Consult attending: Dr. 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 Her hospital course was complicated by gastroparesis and intraabdominal sepsis with multiple collections requiring percutaneous drainage. Her drains have been removed and she is recovering on the floor. She is significantly deconditioned and will require rehab after discharge. She has also developed anorexia with failure to thrive. She is receiving intragastric tube feeds via [place] on a 12h cycle and tolerating them well. Thoracic Surgery is consulted regarding alternate enteral feeding access.    ROS: General ROS: positive for  - fatigue  Psychological ROS: negative  Ophthalmic ROS: negative  ENT ROS: negative  Allergy and Immunology ROS: negative  Hematological and Lymphatic ROS: negative  Endocrine ROS: negative  Respiratory ROS: no cough, shortness of breath, or wheezing  Cardiovascular ROS: negative  Gastrointestinal ROS: positive for - abdominal pain    Patient History:   Past medical history:  Past Medical History:   Diagnosis Date    Acute kidney failure 03/31/2017    Cancer     Depression     Fibromyalgia     Hypothyroidism        Past surgical history:  Past Surgical History:   Procedure Laterality Date    CHOLECYSTECTOMY      CHOLECYSTECTOMY, LAPAROSCOPIC  09/06/2016    HYSTERECTOMY  08/1986    Fibroids    KNEE SURGERY Right     PR INSERT TUNNELED CV CATH WITH PORT Right 10/04/2016    Procedure: Right IJ MEDIPORT Insertion;  Surgeon: Delano Metz, MD;  Location: [facility] MAIN OR;  Service: Oncology General    PR LAP,DIAGNOSTIC ABDOMEN N/A 10/04/2016    Procedure: LAPAROSCOPY DIAGNOSTIC;  Surgeon: Delano Metz, MD;  Location: [facility] MAIN OR;  Service: Oncology General    PR PART [place] PANC,PROX+REMV DUOD+ANAST N/A  03/29/2017    Procedure: WHIPPLE PROCEDURE;  Surgeon: Delano Metz, MD;  Location: [facility] MAIN OR;  Service: Oncology General    ROTATOR CUFF REPAIR Right     TONSILLECTOMY AND ADENOIDECTOMY         Family history:  Family History   Problem Relation Age of Onset    Breast cancer Mother         died 58    Cancer Father         NHL, died 71    Heart Disease Father     Diabetes Sister     Obesity Sister         4 siblings       Social history:  Social History     Social History    Marital status: Divorced     Spouse name: N/A    Number of children: N/A    Years of education: N/A     Social History Main Topics    Smoking status: Never Smoker    Smokeless tobacco: Never Used    Alcohol use No    Drug use: No    Sexual activity: Not Asked     Other Topics Concern    None     Social History Narrative    None       Medications:   Home Meds:   Prescriptions Prior to Admission   Medication Sig    methadone (DOLOPHINE) 5 MG tablet Take 15 mg by  mouth nightly    LORazepam (ATIVAN) 0.5 MG tablet Take 1 tablet (0.5 mg total) by mouth 2 times daily as needed for Anxiety   Max daily dose: 1 mg (Patient taking differently: Take 0.5 mg by mouth 2 times daily as needed for Anxiety   0.37m nightly and prn)    methadone (DOLOPHINE) 5 MG tablet Take 23min the morning, 2022mn afternoon and 26m72m night. (Patient taking differently: 10 mg 2 times daily   Take 20mg41mthe morning, 20mg 64mfternoon and 26mg a45mght.)    enoxaparin (LOVENOX) 100 mg/mL injection Inject 1 Syringe (100 mg total) into the skin daily (Patient taking differently: Inject 100 mg into the skin 2 times daily )    potassium chloride SA (KLOR-CON M20) 20 mEq  tablet Take 20 mEq by mouth 2 times daily    famotidine (PEPCID) 20 MG tablet Take 1 tablet (20 mg total) by mouth 2 times daily    escitalopram (LEXAPRO) 20 MG tablet Take 1 tablet (20 mg total) by mouth daily (Patient taking differently: Take 20 mg by mouth nightly   )    amitriptyline  (ELAVIL) 25 MG tablet Take 1 tablet (25 mg total) by mouth nightly    dexamethasone (DECADRON) 4 MG tablet Take 1 tablet (4 mg total) by mouth 2 times daily (Patient taking differently: Take 4 mg by mouth daily (with breakfast)   )    docusate sodium (COLACE) 100 MG capsule Take 2 capsules (200 mg total) by mouth 2 times daily    metoclopramide (REGLAN) 5 MG tablet Take 1 tablet (5 mg total) by mouth 3 times daily (before meals)    pantoprazole (PROTONIX) 40 MG EC tablet Take 1 tablet (40 mg total) by mouth 2 times daily (before meals)   Swallow whole. Do not crush, break, or chew.    senna (SENOKOT) 8.6 MG tablet Take 2 tablets by mouth 2 times daily    PREMARIN 0.625 MG tablet Take 0.625 mg by mouth every morning       ondansetron (ZOFRAN-ODT) 4 MG disintegrating tablet Take 1 tablet (4 mg total) by mouth 3 times daily as needed for Nausea   Place on top of tongue.    levothyroxine (SYNTHROID, LEVOTHROID) 137 MCG tablet Take 1 tablet (137 mcg total) by mouth daily (before breakfast)    Vitamins - senior (CENTRUM SILVER) TABS Take 1 tablet by mouth daily (Patient taking differently: Take 1 tablet by mouth every morning   )    triamterene-hydrochlorothiazide (MAXZIDE-25) 37.5-25 MG per tablet Take 1 tablet by mouth daily (Patient taking differently: Take 1 tablet by mouth every morning   )    fluorouracil (ADRUCIL) 50 MG/ML injection Administer 3,477 mg into the vein every 14 days   Start in infusion center.  Infuse over 46 hours at home.    pegfilgrastim (NEULASTA) 6 MG/0.6ML injection syringe Inject 0.6 mLs (6 mg total) into the skin once    Meds Exist in Therapy Plan Peg-Filgrastim    heparin 100 UNIT/ML injection 5 mLs (500 Units total) by Intracatheter route as needed (for line care)    sodium chloride 0.9 % flush 10 mLs by Intracatheter route as needed (for line care)     Scheduled Meds:   ciprofloxacin  500 mg Oral 2 times per day    sulfamethoxazole-trimethoprim  1 tablet Oral 2 times per  day    lidocaine  2 patch Transdermal Q24H    methylphenidate  2.5 mg  Oral BID    methadone  7.5 mg Oral BID    methadone  5 mg Oral Daily    fluconazole  800 mg Oral Daily    insulin glargine  6 Units Subcutaneous Nightly    pantoprazole  40 mg Oral BID AC    PROSOURCE NO CARB  30 mL Oral BID WC    senna  2 tablet Oral 2 times per day    bisacodyl  10 mg Rectal Daily    enoxaparin  40 mg Subcutaneous Daily    DULoxetine  20 mg Oral Daily    metoclopramide  10 mg Oral 4x Daily AC & HS    docusate sodium  200 mg Oral 2 times per day    polyethylene glycol  17 g Oral 2 times per day    acetaminophen  1,000 mg Oral Q8H    levothyroxine  137 mcg Oral Daily     Continuous Infusions:   lactated ringers 75 mL/hr (05/30/17 1753)     PRN Meds:.HYDROmorphone, naloxone, sodium chloride, dextrose, calcium carbonate, heparin lock flush, ondansetron    Physical Exam:     Vitals:    05/30/17 1658   BP: 118/71   Pulse: 66   Resp: 16   Temp: 36.3 C (97.3 F)   Weight:    Height:      General: no apparent distress, appears stated age  Neuro: A&Ox3, no focal deficits  HEENT: atraumatic, sclera anicteric, PERRL, MMM. DHT taped at nose  Neck: supple, no JVD, no lymphadenopathy  Heart: regular, nl S1, S2, no murmurs or extra sounds  Lungs: unlabored respiration, clear to auscultation bilaterally without rhonchi, rales, or wheezes  Abdomen: soft, distended, somewhat tender throughout. Well healed midline laparotomy  Extremities:  Some edema in lower extremities  Skin: warm and dry, no rashes, lesions, ulcers  Psych: appropriate affect    Labs:     Heme/Coag:  Recent Labs  Lab 05/30/17  0111  05/27/17  0019   WBC 9.5  < > 10.9*   Hemoglobin 6.8*  < > 6.9*   Hematocrit 22*  < > 23*   Platelets 551*  < > 526*   Protime  --   --  15.3*   INR  --   --  1.4*   < > = values in this interval not displayed. ABG:No results for input(s): APH, APCO2, APO2, AHCO3, ABE, WBLAC in the last 168 hours.   Chemistry:  Recent Labs  Lab  05/29/17  0019   Sodium 134   Potassium 4.3   Chloride 95*   CO2 27   UN 16   Creatinine 0.71   Glucose 111*   Calcium 8.5*   Magnesium 1.8   Phosphorus 5.8*    LFT:  Recent Labs  Lab 05/29/17  0019 05/27/17  0019   AST 53* 56*   ALT 45* 47*   Alk Phos 417* 396*   Bilirubin,Total <0.2 <0.2   Albumin 2.3* 2.4*   Prealbumin  --  13*        Review of Imaging Studies:   No results found.    Assessment and Plan:   Amanda Galloway is a 72 y.o. female with failure to thrive following a prolonged hospital course after Whipple resection who is being supported with nasogastric feeds.    1. Unfortunately there is no safe window for percutaneous gastrostomy. There are loops of bowel cranial to the stomach which are likely adherent there given her history of  intraabdominal infection  2. While it can be challenging, recommend trying to get a rehab facility that will accept the [place] as this has provided good access for her. Alternatively primary team could pursue open gastrostomy   3. No further Thoracic Surgery recommendations. Please page the resident on call with questions  4. The above history, physical exam, and assessment were discussed with Dr. Dell Ponto and the plan reflects his direct supervision.    Amanda Rumble, MD  Thoracic surgery resident    Thoracic Surgery Attending Note    I saw and evaluated the patient and reviewed pertinent data. I agree with the resident's/fellow's findings and plan of care as documented above.     Unfortunately no good access for peg. The bowel is draped over the stomach and given her surgical history, not safe to proceed with peg. Options including open G tube placement vs keeping dobhoff in place a little longer.      Gasper Sells, MD   06/05/2017  10:59 AM    (delayed entry)

## 2017-05-30 NOTE — Plan of Care (Signed)
Problem: Safety  Goal: Patient will remain free of falls  Outcome: Maintaining      Problem: Pain/Comfort  Goal: Patient's pain or discomfort is manageable  Outcome: Maintaining      Problem: Mobility  Goal: Functional status is maintained or improved - Geriatric  Outcome: Maintaining    Goal: Patient's functional status is maintained or improved  Outcome: Maintaining      Problem: Nutrition  Goal: Patient's nutritional status is maintained or improved  Outcome: Maintaining    Goal: Nutritional status is maintained or improved - Geriatric  Outcome: Maintaining      Problem: Bowel Elimination  Goal: Elimination patterns are normal or improving  Outcome: Progressing towards goal      Problem: Post-Operative Hemodynamic Stability  Goal: Maintain Hemodynamic Stability  Outcome: Progressing towards goal      Problem: Post-Operative Complications  Goal: Patient will remain free from symptoms of infection-post op  Outcome: Maintaining    Goal: Prevent post-operative complications  Outcome: Maintaining      Problem: Post-Operative Bowel Elimination  Goal: Elimination pattern is normal or improving  Outcome: Progressing towards goal      Problem: Psychosocial  Goal: Demonstrates ability to cope with illness  Outcome: Maintaining      Problem: Fluid and Electrolyte Imbalance  Goal: Fluid and Electrolyte imbalance  Outcome: Maintaining      Problem: GI Bleeding Elimination  Goal: Elimination of patterns are normal or improving  Outcome: Maintaining

## 2017-05-30 NOTE — Progress Notes (Addendum)
Surgical Oncology/Hepatobiliary Surgery Progress Note     LOS: 62 days     Subjective:  Feeling well this morning. Pain controlled. 540 po    Objective:  Vitals Sign Ranges for Past 24 Hours:  BP: (108-130)/(60-80)   Temp:  [36.1 C (97 F)-36.8 C (98.2 F)]   Temp src: Temporal (03/28 0332)  Heart Rate:  [61-103]   Resp:  [16]   SpO2:  [91 %-99 %]     Physical Exam:  General Appearance: in NAD, speaking slowly/quietly  Cardiac: regular rate  Respiratory: non-labored breathing on room air  Abdomen: Soft, minimally tender to palpation, distended, mild ecchymosis, biliary tube site dressed  Extremities: warm    Labs:    CBC:    Recent Labs  Lab 05/30/17  0111 05/29/17  0019 05/27/17  2350 05/27/17  0019 05/26/17  0002 05/24/17  2350   WBC 9.5 9.8 11.5* 10.9* 11.2* 9.8   Hemoglobin 6.8* 6.9* 6.9* 6.9* 7.3* 7.0*   Hematocrit 22* 22* 23* 23* 23* 23*   Platelets 551* 577* 544* 526* 534* 102*       Metabolic Panel:    Recent Labs  Lab 05/29/17  0019 05/27/17  0019 05/24/17  2350   Sodium 134 136 133   Potassium 4.3 4.2 4.5   Chloride 95* 97 95*   CO2 _0 UN _1 Creatinine 0.71 0.65 0.67   Glucose 111* 117* 109*   Calcium 8.5* 8.6 8.8   Magnesium 1.8 2.0 1.9   Phosphorus 5.8* 4.8* 5.1*          Assessment:  72 y.o. female with h/o recent PE and pancreatic cancer POD # 51  status post whipple procedure complicated by delayed gastric emptying.  NGT replaced on 04/04/17. UGI on 04/12/17 concerning for obstruction just beyond the [place] in the efferent limb.  Course complicated on 5/85/27 by rising LFTs and sepsis with CT concerning for cholangitis given biliary tree obstruction and PV compression due to hematoma and afferent limb distention in setting of PE treatment. Is s/p IR PTC on 04/16/17 and IR percutaneous aspiration of anterior abdominal fluid collection on 04/24/17. IR LUQ perc drain placement 3/1. IR anterior abdominal 61f perc drain placement 3/3. IR anterior perc drain into left hepatic collection  3/6.    Plan:  - GI unable to place PEG   - Regular diet + ensures  - Continue to encourage PO/enteral nutrition, TFs to goal - now cycled at 12 hours   - Aggressive bowel regimen  - CT scan 3/21 with intra-abd collections, new portal venous collection. However, patient remains afebrile, clinically stable, WBCs downtrending, so do not plan to drain at this time. Continue ertapenem/caspo/dapto, appreciate ID recs.  - biliary drain removed 3/26  - SCDs, Lovenox for VTE ppx (hold therapeutic anticoagulation)   - Dispo: Pending clinical course, adequate nutrition/PO intake    ASheryn Bison MD  05/30/2017     6:26 AM   General Surgery Resident          HPB-GI Attending Addendum:   I personally examined the patient, reviewed the notes, and discussed the plan of care with the residents and patient. Agree with detailed resident's note. Please see the note above for details of history, exam, labs, assessment/plan which reflect my input.     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 Abdominal abscess on imaging.  Hyperglycemia with insulin sliding scale and recurrent need for insulin. continue   Post operative anemia. Stable. No need for transfusion  Bilateral pleural effusions. Stable. No oxygen requirement.   Pulmonary embolism. Present on admission. No evidence of worsening.  Will restart Lovenox.   Hypoalbuminemia. Moderate malnutrition. Tube feedings at goal. Encourage oral intake.    inadequate oral intake alone. Requesting PEG tube.   Depression. Encouragement provided. Slightly improved. Ritalin helpful. Hallucinations.   Multiple intraabdominal abscesses with Enterobacter cloacae complex bacteremia and fungus.   Small collections. Remaining collections are difficult to reach and resolving. Continue antibiotics. Under consultation with ID, we will give trial of oral antibiotics.   Constipation . Aggressive  regimen.   Physical debility. Requires rehabilitation setting.      Delano Metz, MD, FACS  Hepatobiliary, Pancreas & GI Surgery

## 2017-05-30 NOTE — Progress Notes (Addendum)
Infectious Diseases (Team 3) Follow Up Note      Personally reviewed chart, labs, medications  Patient seen.     CC:  F/U Enterobacter cloacae bacteremia, Intra-abdominal abscesses positive for VRE, enterobacter and candida glabrata    Subjective:  Sitting in chair, feeling "about the same", denies N/V/D, F/C, abdominal discomfort seemed less during exam.  Daughter at bedside.     ROS: Reviewed 6 systems in detail, see above    Current Meds:  Scheduled Meds:   lidocaine  2 patch Transdermal Q24H    daptomycin IV  8 mg/kg Intravenous Q24H    methylphenidate  2.5 mg Oral BID    methadone  7.5 mg Oral BID    methadone  5 mg Oral Daily    fluconazole  800 mg Oral Daily    insulin glargine  6 Units Subcutaneous Nightly    pantoprazole  40 mg Oral BID AC    PROSOURCE NO CARB  30 mL Oral BID WC    ertapenem  1,000 mg Intravenous Q24H    senna  2 tablet Oral 2 times per day    bisacodyl  10 mg Rectal Daily    enoxaparin  40 mg Subcutaneous Daily    DULoxetine  20 mg Oral Daily    metoclopramide  10 mg Oral 4x Daily AC & HS    docusate sodium  200 mg Oral 2 times per day    polyethylene glycol  17 g Oral 2 times per day    acetaminophen  1,000 mg Oral Q8H    levothyroxine  137 mcg Oral Daily     Continuous Infusions:   lactated ringers 75 mL/hr (05/30/17 0545)     PRN Meds:.   calcium carbonate  1,000 mg Oral BID PRN    HYDROmorphone PF  0.5 mg Intravenous Q1H PRN    Or    HYDROmorphone PF  1 mg Intravenous Q1H PRN    LORazepam  0.5 mg Oral Q6H PRN    heparin lock flush  50 Units Intracatheter PRN    ondansetron  4 mg Intravenous Q6H PRN       Objective:    BP: (112-130)/(70-80)   Temp:  [36.1 C (97 F)-36.8 C (98.2 F)]   Temp src: Temporal (03/28 0844)  Heart Rate:  [61-103]   Resp:  [16]   SpO2:  [91 %-99 %]     General Appearance:  NAD, speaks softly, smiling during visit  HEENT: scleral anicteric, MMM, oropharynx clear, Dobhoff present.   Pulm: diminished bilaterally L>R (encouraged to use  Inc Spirometer)  CV: RRR, Normal S1S2, no M/R/G  Abdomen:  Distended, Upper Quads still firm but comparatively a bit softer today, not as TTP, active bowel sounds  Extremities: MAE, WWP  Skin:Warm and dry to touch; no rashes  Neuro: alert and oriented x4, speech fluent, no obvious focal deficits  Lines/Drains/Tubes:  right chest wall port (accessed, site benign)      Lab results: 05/30/17  0111  05/11/17  0045 05/10/17  0042  04/23/17  2343  04/21/17  2348   WBC 9.5  < > 11.2* 11.1*  < > 18.8*  < > 15.5*   Hemoglobin 6.8*  < > 7.9* 7.9*  < > 8.9*   9.4*  < > 8.7*   Hematocrit 22*  < > 25* 25*  < > 27*  < > 26*   RBC 2.4*  < > 2.7* 2.7*  < > 3.0*  < > 3.0*  Platelets 551*  < > 513* 517*  < > 340  < > 353   Neut # K/uL 7.0*  < > 9.6* 8.6*  < > 16.0*  < > 14.4*   Lymph # K/uL 1.1*  < > 0.9* 1.3  < > 1.5  < > 0.6*   Mono # K/uL 0.9  < > 0.5 0.6  < > 0.3  < > 0.3   Eos # K/uL 0.4  < > 0.0 0.6*  < > 0.2  < > 0.0   Baso # K/uL 0.1  < > 0.0 0.0  < > 0.0  < > 0.0   Seg Neut % 73.4  < > 86.0 75.3  < > 84.4  < > 90.8   Lymphocyte % 12.0  < > 7.0 10.5  < > 7.8  < > 4.2   Monocyte % 9.8  < > 4.0 5.3  < > 1.7  < > 1.7   Eosinophil % 3.8  < > 0.0 5.3  < > 0.9  < > 0.0   Basophil % 0.5  < > 0.0 0.0  < > 0.0  < > 0.0   Bands %  --   --   --  2  < > 1  < > 2   Myelocyte %  --   --  1*  --   < > 1*  < >  --    Metamyelocyte %  --   --  1 1  < > 2*  < > 1   Promyelocyte %  --   --   --   --   --  2*  --   --    Blasts %  --   --   --   --   --   --   --  1*   < > = values in this interval not displayed.      Recent Labs  Lab 05/29/17  0019 05/27/17  0019 05/24/17  2350   Sodium 134 136 133   Potassium 4.3 4.2 4.5   CO2 '27 27 26   ' UN '16 20 19   ' Creatinine 0.71 0.65 0.67   Glucose 111* 117* 109*   Calcium 8.5* 8.6 8.8         Lab results: 05/29/17  0019 05/27/17  0019 05/24/17  2350 05/23/17  0030 05/21/17  0420   Total Protein 6.7 7.1 7.1 7.7 6.6   Albumin 2.3* 2.4* 2.4* 2.7* 2.3*   ALT 45* 47* 49* 78* 64*   AST 53* 56* 51* 84* 57*    Alk Phos 417* 396* 428* 568* 467*   Bilirubin,Total <0.2 <0.2 0.2 0.2 <0.2         Lab results: 05/29/17  0019   CK 82       Micro:   1/25: Intraoperative bile: GS 0 pmns, no organisms, Cx: no growth  2/12: 1 set of blood cultures from the right Mediport (no peripheral bld was sent): positive for Enterobacter cloacae complex in 70 hrs, sensitive to zosyn.   2/18 abdominal GS had >25 PMNs, many GPC in pairs/chains, many GNB and cultures 4+ Enterobacter cloacae complex. The cultures also grew 1+ Candida glabrata   2/21: BCx from the Right peripheral IV, mediport, L IJ:  No growth  05/03/17: IR-guided I&D with drain placement of the left upper quadrant collection: GS > 25 PMNs, many GNB and GN diplococci, Cultures with 4+ Enterobacter cloacae complex (  resistant to Zosyn and CTX), C. Albicans and C. Glabrata, and Candida tropicalis  05/04/17:    1 set of blood cultures from left IJ: VRE at 15.3 TTP. Daptomycin sensitivities: MIC 78mg/ml (dose dependent),  Linezolid   1 set from periphery: VRE and Enterobacter cloacae at 14.8 TTP   05/05/17:  IR-guided I&D with drain placement of the intrahepatic collection: GS 1-10 PMNs, very few GPC in pairs. Fungal cultures with Candida glabrata  05/05/17: Blood cultures from periphery, Mediport and IJ: no growth  05/04/17: Urine cultures obtained (for unclear reason): no growth  05/08/17: Abscess GS: >25 PMNs, many GPC in chains, few GNB. Aerobic cultures growing 2+ Enterobacter (ertapenem, Cipro, TMP/SMX-sensitive) and 3+VRE, Fungal: C. Glabrata (sens to caspo, vori and dose dependent sens to fluconazole)        Imaging/Other Relevant Diagnostics:   05/23/17 CT abd/pelvis with contrast:   1. Again noted are several communicating multiloculated fluid collections in the left upper quadrant post percutaneous drainage removal. These collections have slightly increased in size compared with 05/14/2017.  2. Reaccumulation of the fluid collection along the anterior margin of the left hepatic lobe,  status post percutaneous drainage removal.  3. Slight interval decrease in size of an irregular fluid collection inferior right hepatic lobe. Other stable low-attenuation lesions unchanged in size.   4. Unchanged moderate left pleural effusion with left lower lobe atelectasis. Unchanged consolidation in the right lower lobe with bronchiectasis.  5. Unchanged periductal edema and focal fluid collection along the right internal/external biliary drain.      Assessment and Plan:  ID problem(s):   -Enterobacter cloacae bacteremia  -Multiple and extensive Intra-abdominal abscesses growing VRE, enterobacter and candida glabrata    89yoold female with pancreatic cancer, s/p Whipple procedure on 03/29/17 after neoadjuvant chemotherapy and radiation. Her post op course was c/b delayed gastric emptying, gastric distension and obstruction and a large surgical bed hematoma causing CBD and hepatic vein compression, severe biliary sepsis, s/p placement of a right hepatic duct external biliary drain placed on 2/12, found to have enterobacter cloacae bacteremia (2/12) presumed to be secondary to infection of the hematoma. However CT of abdomen (2/18) showed the hematoma had resolved but noted to have a well-circumscribed and larger anterior abdominal collection percutaneously, which was aspirated (2/19) and cultures grew enterobacter cloacae and Candida glabrata. ID service consulted on 2/21 with question of treating C.glabrata.  Caspofungin was started and a perc drain was placed LUQ 3/1, then anterior abdominal perc drain placed 3/3, and then anterior perc drain into left hepatic collection 05/08/17, the goal being source control with drain placements. She had been on Zosyn but her enterobacter became (amp-C inducible) thus switched to ertapenem and Daptomycin added on 05/04/17 for continued broad coverage.  Abdominal perc drains  In fluid collections were removed and biliary tube remains (clamped). Caspofungin was switched to oral  high-dose fluconazole (1103mkg) daily once sensitivities returned on C.glabrata. Pt on methadone for pain control, QTc 409.     Currently SyAlsaces feeling "about the same".  She is taking very small amounts PO with great effort.  She was not been able to have a PEG tube placed in IR d/t no safe window. Other options are being sought out. She remains afebrile with a stable white count WNL. She has completed 23 days of a planned 3 week course (from time of abdominal perc drain insertion) of Daptomycin/Ertapenem/Fluconazole. She no longer has any of previous abdominal drains in place (most were removed last week)  and her biliary tube was removed 2 days ago. She is HDS.       Options for antimicrobial plan discussed with Dr. Ron Agee this morning as well as with ID attending, Dr. Edgar Frisk. In reviewing goals of care and locations of intraabdominal fluid collections/accessibility, drainage is not planned at this time.   Plan going forward recommended is to stop IV Daptomycin and Ertapenem and switch to oral antibiotics. The only oral agent for the patient's VRE is Linezolid, however, due to multiple and potentially serious drug-to-drug interactions (methadone, SNRI, and antifungal - potential for SSS and QTc prolongation ), Linezolid is not an option. It is reasonable to see how patient does without VRE coverage given she has received Daptomycin for a full three weeks.  In place of Ertapenem, will switch to Bactrim DS 1 tab twice daily and fluconazole will remain on board.  Plan discussed with covering provider A. Haugh, PA    Recommendations:   Discontinue Daptomycin and Ertapenem   Continue oral fluconazole at present dose (12 mg/kg, round up to 800 mg daily), duration TBD pending re-imaging of abdomen   Start Bactrim DS 1 tablet every 12 hours, duration TBD depending on tolerance and re-imaging of abdomen.    Follow weekly CBC with diff, CMP, CRP/ESR    Repeat CT abdomen/pelvis in 2 weeks   ID (Team 3) will continue  to  follow    Thank you for allowing [name] to participate in the care of this patient  Please call with questions  .   Amanda Galloway, ANP-BC  Infectious Diseases, Team 3  Pager [phone]

## 2017-05-31 ENCOUNTER — Other Ambulatory Visit: Payer: Self-pay | Admitting: Cardiology

## 2017-05-31 LAB — CBC AND DIFFERENTIAL
Baso # K/uL: 0.1 10*3/uL (ref 0.0–0.1)
Basophil %: 0.8 %
Eos # K/uL: 0.4 10*3/uL (ref 0.0–0.4)
Eosinophil %: 5.1 %
Hematocrit: 23 % — ABNORMAL LOW (ref 34–45)
Hemoglobin: 6.9 g/dL — ABNORMAL LOW (ref 11.2–15.7)
IMM Granulocytes #: 0.1 10*3/uL
IMM Granulocytes: 0.6 %
Lymph # K/uL: 1.1 10*3/uL — ABNORMAL LOW (ref 1.2–3.7)
Lymphocyte %: 13 %
MCH: 28 pg/cell (ref 26–32)
MCHC: 31 g/dL — ABNORMAL LOW (ref 32–36)
MCV: 93 fL (ref 79–95)
Mono # K/uL: 0.8 10*3/uL (ref 0.2–0.9)
Monocyte %: 9 %
Neut # K/uL: 6.2 10*3/uL — ABNORMAL HIGH (ref 1.6–6.1)
Nucl RBC # K/uL: 0 10*3/uL (ref 0.0–0.0)
Nucl RBC %: 0.1 /100 WBC (ref 0.0–0.2)
Platelets: 540 10*3/uL — ABNORMAL HIGH (ref 160–370)
RBC: 2.4 MIL/uL — ABNORMAL LOW (ref 3.9–5.2)
RDW: 17.5 % — ABNORMAL HIGH (ref 11.7–14.4)
Seg Neut %: 71.5 %
WBC: 8.7 10*3/uL (ref 4.0–10.0)

## 2017-05-31 LAB — COMPREHENSIVE METABOLIC PANEL
ALT: 41 U/L — ABNORMAL HIGH (ref 0–35)
AST: 44 U/L — ABNORMAL HIGH (ref 0–35)
Albumin: 2.2 g/dL — ABNORMAL LOW (ref 3.5–5.2)
Alk Phos: 453 U/L — ABNORMAL HIGH (ref 35–105)
Anion Gap: 10 (ref 7–16)
Bilirubin,Total: <0.2 mg/dL (ref 0.0–1.2)
CO2: 27 mmol/L (ref 20–28)
Calcium: 8.3 mg/dL — ABNORMAL LOW (ref 8.6–10.2)
Chloride: 99 mmol/L (ref 96–108)
Creatinine: 0.61 mg/dL (ref 0.51–0.95)
GFR,Black: 106 *
GFR,Caucasian: 92 *
Glucose: 112 mg/dL — ABNORMAL HIGH (ref 60–99)
Lab: 17 mg/dL (ref 6–20)
Potassium: 4.3 mmol/L (ref 3.3–5.1)
Sodium: 136 mmol/L (ref 133–145)
Total Protein: 6.4 g/dL (ref 6.3–7.7)

## 2017-05-31 LAB — FUNGUS CULTURE

## 2017-05-31 LAB — MAGNESIUM: Magnesium: 1.6 mg/dL (ref 1.6–2.5)

## 2017-05-31 LAB — PHOSPHORUS: Phosphorus: 4.9 mg/dL — ABNORMAL HIGH (ref 2.7–4.5)

## 2017-05-31 LAB — SEDIMENTATION RATE, AUTOMATED: Sedimentation Rate: 30 mm/hr (ref 0–30)

## 2017-05-31 LAB — CRP: CRP: 174 mg/L — ABNORMAL HIGH (ref 0–10)

## 2017-05-31 LAB — POCT GLUCOSE: Glucose POCT: 110 mg/dL — ABNORMAL HIGH (ref 60–99)

## 2017-05-31 MED ORDER — METHADONE HCL 5 MG PO TABS *I*
7.5000 mg | ORAL_TABLET | Freq: Two times a day (BID) | ORAL | Status: AC
Start: 2017-05-31 — End: 2017-06-11
  Administered 2017-05-31 – 2017-06-04 (×9): 7.5 mg via ORAL
  Administered 2017-06-04: 5 mg via ORAL
  Administered 2017-06-04: 2.5 mg via ORAL
  Administered 2017-06-05 – 2017-06-11 (×14): 7.5 mg via ORAL
  Filled 2017-05-31 (×25): qty 2

## 2017-05-31 MED ORDER — LIDOCAINE 5 % EX PTCH *I*
1.0000 | MEDICATED_PATCH | CUTANEOUS | Status: AC
Start: 2017-05-31 — End: 2017-07-29
  Administered 2017-05-31 – 2017-07-29 (×60): 1 via TRANSDERMAL
  Filled 2017-05-31 (×64): qty 1

## 2017-05-31 MED ORDER — POLYETHYLENE GLYCOL 3350 PO PACK 17 GM *I*
17.0000 g | PACK | Freq: Every day | ORAL | Status: DC
Start: 2017-06-01 — End: 2017-06-07
  Administered 2017-06-01 – 2017-06-07 (×7): 17 g via ORAL
  Filled 2017-05-31 (×7): qty 17

## 2017-05-31 NOTE — Progress Notes (Signed)
SPIRITUAL ASSESSMENT NOTE    Identified Religion: Christian      Pt was visibly tired today. She told me it had been a tough day for her. While I was with her, the student nurses arrived to take her for a walk but she really did not want to go. She told me last time she only got a short way before they had to find her a chair because her legs were giving way. As has often been the case when I see her, the pt said she is tired and just wants it to be over. She said she doesn't ever want to go through this again and would not be willing to do it a second time. She said she has told her children this and we discussed how helpful it is to let people know what her wishes are regarding her care. She asked for prayers for strength and we prayed together before I left. I let the nurses know that she was reluctant to walk. Pt had told me she thought that was supposed to happen later and I relayed this comment to them. They decided to wait for the next shift to help her walk.        Patient Coping: Open discussion  Family Coping: Hopeful/ optimistic  Chaplain Interventions: Prayer, Reflective listening    Plan of Care:  Nothing further at this time. Pt knows she can request a chaplain if the need arises.    Lynann Beaver      2:48 PM on 05/31/2017

## 2017-05-31 NOTE — Progress Notes (Signed)
Palliative Care Progress Note    HPI: [name] w/ PMH fibromyalgia, pancreatic CA s/p Whipple for 'cure' 03/29/17; s/p ICU w/ intub for resp failure & septic shock, weaned from pressors and extubated to HFNC 2/17 w/ IR drainage bili fluid collection 2/19, ID following & receiving IV abx (caspo ends 3/2). Palliative following for symptoms, started methadone 2/19, weaned dose 2/23 d/t concern for sedation. S/p significant constipation requiring bowel clean out (s/p enema & methylnaltrexone). Unfortunately course complicated by intraabdominal abscesses, s/p IR for placement of perc drains & currently continues on IV abx; s/p TPN w/ poor po intake. Trial ritalin 1x/d on 3/7 w/ fair effect, much better result noted 3/8 w/ 2x/d dosing, more interactive when ritalin restarted 3/21. Pt w/ ongoing poor po intake, now tolerating full cyclic fds via dubhoff fdg tube & unfortunately GI eval indicates no window to safely get PEG. Goal dispo SNF Rehab.    Subjective: "It's been a tough morning with constantly up to the BR having stools, it's not formed or diarrhea and there was a lot of it. It was not pleasant. I'm not in pain at the moment, just tired. I'll try to eat some lunch, it looks good but I usually on get a few bites then full and don't want any more. I suppose I'm worried about how [name] (dtr) is doing"    Objective: chart reviewed, pt case discussed w/ student RN who reports pt has had busy morning up to BR and bathed, now set up for lunch; pt appreciating gentle leg rub & moisturizing to prevent skin tears; she denies pain currently, feeling tired and worried about dtr having procedure today - emotional support offered     Palliative Care ROS:  Pain   Mild  Nausea   None  Anorexia   Moderate  Anxiety   Mild  Depression   Mild  Shortness of Breath   None  Tiredness/Fatigue   Mild  Drowsiness/Sleepiness   Mild  Airway Secretions   no  Constipation   no  Unable to Respond   no  Delirium   no   Last stool  3/29    Physical Examination:   BP: (100-130)/(68-80)   Temp:  [35.6 C (96.1 F)-36.7 C (98.1 F)]   Temp src: Temporal (03/29 1547)  Heart Rate:  [65-96]   Resp:  [16-17]   SpO2:  [91 %-98 %]   Gen appearance: [name], sitting up in recliner, no acute distress, reporting pain currently controlled, feeling tired out after freq stooling this AM and getting bathed  Lungs: easy resp, RA  Heart: reg  Abd: large, soft, distended, stool today  Extrem: mod LE edema  Skin: ruddy LEs, warm, dry  Neuro:  A+O x 3, at baseline ambulates short distances w/ walker, OOB to chair stand & pivot w/ walker; pt requires standby assist     Assessment/Plan: [name] w/ pancreatic CA, s/p Whipple in Jan. s/p TPN, s/p perc drains now removed & cont's on IV abx for resolving abd abscesses. Pt encouraged to take PO w/ poor appetite though is tolerating cyclic TFs. Per IR and GI, not safe window to place PEG. 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    Pain/dyspnea  Acetaminophen 1 gm po TID  Methadone 5/7.5/7.5 mg po TID (total 20 mg/d, last increased 3/11)  Dilaudid 2 mg po Q 4 hrs prn (x 3 on 3/28)  Lidocaine patch daily   Encourage incentive spirometry  prn     Anxiety/agitation/nausea/fatigue  Duloxetine 20 mg po daily  Reglan 10 mg po 4x/d (AC & QHS) increased stool today likely r/t clearing of barium, if continues consider cutting back on reglan  Pantoprazole 40 mg po BID  Calcium carbonate 1 gm po BID prn (x 1 on 3/25)  Lorazepam 0.5 mg po Q 6 hrs prn (last x 1 on 3/7 QHS)  Ondansetron 4 mg IV Q 6 hrs prn (last x 1 on 3/10)  Ritalin 2.5 mg po at 8 am and 2.5 mg at 12 pm (reduced dose 3/26) per Phmd unlikely ritalin is cause of mild confusion or appetite suppression    Prevention of constipation, last stool 3/27  Colace 200 mg po BID  Miralax 17 gm po BID (last took 3/26) -consider reducing to daily  Senna 2 tab po BID  Bisacodyl supp pr daily     Dry flaky edematous Skin  Eucerin prn for  BLEs    GOC/prognosis  Full code  Dtr Rod Mae is HCP  Dispo plans now for SNF Rehab     Pt case discussed w/ RN.     We will continue to follow along, please call if questions/concerns or if pt needs to be seen over [place].    Total Time Spent 35 minutes: >50% of time was spent in counseling and/or coordination of care.     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    Hypothyroidism E03.9    Pancreatic cancer C25.9    Anemia D64.9     hx of Pulmonary embolism I26.99    Depression F32.9    Sepsis A41.9       No Known Allergies (drug, envir, food or latex)    Scheduled Meds:    lidocaine  1 patch Transdermal Q24H    methadone  7.5 mg Oral BID    sulfamethoxazole-trimethoprim  1 tablet Oral 2 times per day    methylphenidate  2.5 mg Oral BID    methadone  5 mg Oral Daily    fluconazole  800 mg Oral Daily    insulin glargine  6 Units Subcutaneous Nightly    pantoprazole  40 mg Oral BID AC    PROSOURCE NO CARB  30 mL Oral BID WC    senna  2 tablet Oral 2 times per day    bisacodyl  10 mg Rectal Daily    enoxaparin  40 mg Subcutaneous Daily    DULoxetine  20 mg Oral Daily    metoclopramide  10 mg Oral 4x Daily AC & HS    docusate sodium  200 mg Oral 2 times per day    polyethylene glycol  17 g Oral 2 times per day    acetaminophen  1,000 mg Oral Q8H    levothyroxine  137 mcg Oral Daily       Continuous Infusions:       PRN Meds:  HYDROmorphone, naloxone, sodium chloride, dextrose, calcium carbonate, heparin lock flush, ondansetron

## 2017-05-31 NOTE — Consults (Signed)
Wound/Skin Care Rounds:  [id number]    Patient seen during unit skin care rounds for B elbow wounds. Allevyn Gentle 3x3 in place over each wound and peeled back for assessment. Both areas with small, healing skin tears with L side almost being healed. Please continue current plan of care.     Please consult with any questions or concerns.  Roselee Nova, RN, [employer] [phone]

## 2017-05-31 NOTE — Plan of Care (Signed)
Problem: Safety  Goal: Patient will remain free of falls  Outcome: Progressing towards goal      Problem: Pain/Comfort  Goal: Patient's pain or discomfort is manageable  Outcome: Progressing towards goal      Problem: Mobility  Goal: Functional status is maintained or improved - Geriatric  Outcome: Progressing towards goal    Goal: Patient's functional status is maintained or improved  Outcome: Progressing towards goal      Problem: Nutrition  Goal: Patient's nutritional status is maintained or improved  Outcome: Progressing towards goal    Goal: Nutritional status is maintained or improved - Geriatric  Outcome: Progressing towards goal      Problem: Bowel Elimination  Goal: Elimination patterns are normal or improving  Outcome: Progressing towards goal      Problem: Post-Operative Hemodynamic Stability  Goal: Maintain Hemodynamic Stability  Outcome: Progressing towards goal      Problem: Post-Operative Complications  Goal: Patient will remain free from symptoms of infection-post op  Outcome: Completed or Resolved Date Met: 05/31/17    Goal: Prevent post-operative complications  Outcome: Completed or Resolved Date Met: 05/31/17      Problem: Post-Operative Bowel Elimination  Goal: Elimination pattern is normal or improving  Outcome: Progressing towards goal      Problem: Psychosocial  Goal: Demonstrates ability to cope with illness  Outcome: Progressing towards goal      Problem: Fluid and Electrolyte Imbalance  Goal: Fluid and Electrolyte imbalance  Outcome: Progressing towards goal      Problem: GI Bleeding Elimination  Goal: Elimination of patterns are normal or improving  Outcome: Progressing towards goal

## 2017-05-31 NOTE — Progress Notes (Signed)
Surgical Oncology/Hepatobiliary Surgery Progress Note     LOS: 63 days     Subjective:  No acute events overnight, 300 po intake in the past 24 hrs    Objective:  Vitals Sign Ranges for Past 24 Hours:  BP: (102-130)/(60-80)   Temp:  [35.6 C (96.1 F)-36.7 C (98.1 F)]   Temp src: Temporal (03/29 0756)  Heart Rate:  [63-96]   Resp:  [16-17]   SpO2:  [91 %-97 %]     Physical Exam:  General Appearance: in NAD, resting in bed comfortably  Cardiac: regular rate  Respiratory: non-labored breathing on room air  Abdomen: Soft, minimally tender to palpation, non distended, mild ecchymosis  Extremities: warm    Labs:    CBC:    Recent Labs  Lab 05/31/17  0622 05/30/17  0111 05/29/17  0019 05/27/17  2350 05/27/17  0019 05/26/17  0002   WBC 8.7 9.5 9.8 11.5* 10.9* 11.2*   Hemoglobin 6.9* 6.8* 6.9* 6.9* 6.9* 7.3*   Hematocrit 23* 22* 22* 23* 23* 23*   Platelets 540* 551* 577* 544* 526* 784*       Metabolic Panel:    Recent Labs  Lab 05/31/17  0622 05/29/17  0019 05/27/17  0019 05/24/17  2350   Sodium 136 134 136 133   Potassium 4.3 4.3 4.2 4.5   Chloride 99 95* 97 95*   CO2 _0 UN _1 Creatinine 0.61 0.71 0.65 0.67   Glucose 112* 111* 117* 109*   Calcium 8.3* 8.5* 8.6 8.8   Magnesium 1.6 1.8 2.0 1.9   Phosphorus 4.9* 5.8* 4.8* 5.1*          Assessment:  72 y.o. female with h/o recent PE and pancreatic cancer POD # 54  status post whipple procedure complicated by delayed gastric emptying.  NGT replaced on 04/04/17. UGI on 04/12/17 concerning for obstruction just beyond the [place] in the efferent limb.  Course complicated on 6/96/29 by rising LFTs and sepsis with CT concerning for cholangitis given biliary tree obstruction and PV compression due to hematoma and afferent limb distention in setting of PE treatment. Is s/p IR PTC on 04/16/17 and IR percutaneous aspiration of anterior abdominal fluid collection on 04/24/17. IR LUQ perc drain placement 3/1. IR anterior abdominal 8f perc drain placement 3/3. IR anterior  perc drain into left hepatic collection 3/6.    Plan:  - Will outline abdominal fluid collections with [place] bedside, then GI to consider PEG  - Regular diet + ensures  - Continue to encourage PO/enteral nutrition, TFs to goal - now cycled at 12 hours   - Aggressive bowel regimen  - CT scan 3/21 with intra-abd collections, new portal venous collection. However, patient remains afebrile, clinically stable, WBCs downtrending, so do not plan to drain at this time. On cipro/diflucan/bactrim, appreciate ID recs.  - biliary drain removed 3/26  - SCDs, Lovenox for VTE ppx (hold therapeutic anticoagulation)   - Dispo: Pending clinical course, adequate nutrition/PO intake    CCherylann Banas MD  05/31/2017     8:16 AM   General Surgery Resident

## 2017-06-01 LAB — POCT GLUCOSE: Glucose POCT: 95 mg/dL (ref 60–99)

## 2017-06-01 MED ORDER — BACITRACIN-POLYMYXIN B 500-10000 UNIT/GM EX OINT *I*
TOPICAL_OINTMENT | Freq: Two times a day (BID) | CUTANEOUS | Status: DC
Start: 2017-06-01 — End: 2017-06-14
  Filled 2017-06-01 (×2): qty 28.35

## 2017-06-01 NOTE — Plan of Care (Signed)
Problem: Safety  Goal: Patient will remain free of falls  Outcome: Progressing towards goal      Problem: Pain/Comfort  Goal: Patient's pain or discomfort is manageable  Outcome: Progressing towards goal      Problem: Mobility  Goal: Functional status is maintained or improved - Geriatric  Outcome: Progressing towards goal    Goal: Patient's functional status is maintained or improved  Outcome: Progressing towards goal      Problem: Nutrition  Goal: Patient's nutritional status is maintained or improved  Outcome: Progressing towards goal    Goal: Nutritional status is maintained or improved - Geriatric  Outcome: Progressing towards goal      Problem: Bowel Elimination  Goal: Elimination patterns are normal or improving  Outcome: Progressing towards goal      Problem: Post-Operative Hemodynamic Stability  Goal: Maintain Hemodynamic Stability  Outcome: Progressing towards goal      Problem: Post-Operative Bowel Elimination  Goal: Elimination pattern is normal or improving  Outcome: Progressing towards goal      Problem: Psychosocial  Goal: Demonstrates ability to cope with illness  Outcome: Progressing towards goal      Problem: Fluid and Electrolyte Imbalance  Goal: Fluid and Electrolyte imbalance  Outcome: Progressing towards goal      Problem: GI Bleeding Elimination  Goal: Elimination of patterns are normal or improving  Outcome: Progressing towards goal

## 2017-06-01 NOTE — Progress Notes (Addendum)
Surgical Oncology/Hepatobiliary Surgery Progress Note     LOS: 64 days     Subjective:  No acute events overnight. Feels well this morning. No complaints. 240 po    Objective:  Vitals Sign Ranges for Past 24 Hours:  BP: (100-120)/(60-70)   Temp:  [35.7 C (96.3 F)-36.5 C (97.7 F)]   Temp src: Temporal (03/30 0813)  Heart Rate:  [94-100]   Resp:  [16-17]   SpO2:  [92 %-98 %]     Physical Exam:  General Appearance: in NAD, resting in bed comfortably  Cardiac: regular rate  Respiratory: non-labored breathing on room air  Abdomen: Soft, minimally tender to palpation, non distended, incision well healed   Extremities: warm    Labs:    CBC:    Recent Labs  Lab 05/31/17  0622 05/30/17  0111 05/29/17  0019 05/27/17  2350 05/27/17  0019 05/26/17  0002   WBC 8.7 9.5 9.8 11.5* 10.9* 11.2*   Hemoglobin 6.9* 6.8* 6.9* 6.9* 6.9* 7.3*   Hematocrit 23* 22* 22* 23* 23* 23*   Platelets 540* 551* 577* 544* 526* 235*       Metabolic Panel:    Recent Labs  Lab 05/31/17  0622 05/29/17  0019 05/27/17  0019   Sodium 136 134 136   Potassium 4.3 4.3 4.2   Chloride 99 95* 97   CO2 '27 27 27   ' UN '17 16 20   ' Creatinine 0.61 0.71 0.65   Glucose 112* 111* 117*   Calcium 8.3* 8.5* 8.6   Magnesium 1.6 1.8 2.0   Phosphorus 4.9* 5.8* 4.8*          Assessment:  72 y.o. female with h/o recent PE and pancreatic cancer POD # 44  status post whipple procedure complicated by delayed gastric emptying.  NGT replaced on 04/04/17. UGI on 04/12/17 concerning for obstruction just beyond the [place] in the efferent limb.  Course complicated on 5/73/22 by rising LFTs and sepsis with CT concerning for cholangitis given biliary tree obstruction and PV compression due to hematoma and afferent limb distention in setting of PE treatment. Is s/p IR PTC on 04/16/17 and IR percutaneous aspiration of anterior abdominal fluid collection on 04/24/17. IR LUQ perc drain placement 3/1. IR anterior abdominal 54f perc drain placement 3/3. IR anterior perc drain into left hepatic  collection 3/6.    Plan:  - unable to safely place PEG per thoracic and GI  - SNF planning with DHT  - Regular diet + ensures  - Continue to encourage PO/enteral nutrition, TFs to goal - now cycled at 12 hours   - Aggressive bowel regimen  - CT scan 3/21 with intra-abd collections, new portal venous collection. However, patient remains afebrile, clinically stable, WBCs downtrending, so do not plan to drain at this time. On diflucan/bactrim, appreciate ID recs.  - biliary drain removed 3/26  - SCDs, Lovenox for VTE ppx (hold therapeutic anticoagulation)   - Dispo: Pending clinical course, adequate nutrition/PO intake    ASheryn Bison MD  06/01/2017     8:41 AM   General Surgery Resident    HPB and GI Surgery Attending for Dr. GRon Agee    I saw and evaluated the patient. I have reviewed and edited the resident's/fellow's note and confirm the findings and plan of care as documented above. In good spirits    LCharolotte Eke MD

## 2017-06-02 LAB — POCT GLUCOSE: Glucose POCT: 106 mg/dL — ABNORMAL HIGH (ref 60–99)

## 2017-06-02 MED ORDER — TRIAMTERENE-HCTZ 37.5-25 MG PO TABS *I*
1.0000 | ORAL_TABLET | Freq: Every morning | ORAL | Status: DC
Start: 2017-06-02 — End: 2017-06-27
  Administered 2017-06-02 – 2017-06-17 (×15): 1 via ORAL
  Filled 2017-06-02 (×27): qty 1

## 2017-06-02 NOTE — Progress Notes (Addendum)
Surgical Oncology/Hepatobiliary Surgery Progress Note     LOS: 65 days     Subjective:  No acute events overnight. Still with superficial calf pain and redness. No fevers or chills. 480 po    Objective:  Vitals Sign Ranges for Past 24 Hours:  BP: (105-118)/(70-78)   Temp:  [35.8 C (96.4 F)-36.3 C (97.3 F)]   Temp src: Temporal (03/31 0814)  Heart Rate:  [78-105]   Resp:  [16]   SpO2:  [94 %-97 %]     Physical Exam:  General Appearance: in NAD, resting in bed comfortably  Cardiac: regular rate  Respiratory: non-labored breathing on room air  Abdomen: Soft, minimally tender to palpation, non distended, incision well healed   Extremities: warm, erythema of the bilateral anterior calves with small amount of skin breakdown, minimal edema no posterior calf tenderness     Labs:    CBC:    Recent Labs  Lab 05/31/17  0622 05/30/17  0111 05/29/17  0019 05/27/17  2350 05/27/17  0019   WBC 8.7 9.5 9.8 11.5* 10.9*   Hemoglobin 6.9* 6.8* 6.9* 6.9* 6.9*   Hematocrit 23* 22* 22* 23* 23*   Platelets 540* 551* 577* 544* 852*       Metabolic Panel:    Recent Labs  Lab 05/31/17  0622 05/29/17  0019 05/27/17  0019   Sodium 136 134 136   Potassium 4.3 4.3 4.2   Chloride 99 95* 97   CO2 _0 UN _1 Creatinine 0.61 0.71 0.65   Glucose 112* 111* 117*   Calcium 8.3* 8.5* 8.6   Magnesium 1.6 1.8 2.0   Phosphorus 4.9* 5.8* 4.8*          Assessment:  72 y.o. female with h/o recent PE and pancreatic cancer POD # 65  status post whipple procedure complicated by delayed gastric emptying.  NGT replaced on 04/04/17. UGI on 04/12/17 concerning for obstruction just beyond the [place] in the efferent limb.  Course complicated on 7/78/24 by rising LFTs and sepsis with CT concerning for cholangitis given biliary tree obstruction and PV compression due to hematoma and afferent limb distention in setting of PE treatment. Is s/p IR PTC on 04/16/17 and IR percutaneous aspiration of anterior abdominal fluid collection on 04/24/17. IR LUQ perc drain  placement 3/1. IR anterior abdominal 88f perc drain placement 3/3. IR anterior perc drain into left hepatic collection 3/6.    Plan:  - polysporin and compression stockings to bilateral calves   - unable to safely place PEG per thoracic and GI  - SNF planning with DHT  - Regular diet + ensures  - Continue to encourage PO/enteral nutrition, TFs to goal - now cycled at 12 hours   - Aggressive bowel regimen  - CT scan 3/21 with intra-abd collections, new portal venous collection. However, patient remains afebrile, clinically stable, WBCs downtrending, so do not plan to drain at this time. On diflucan/bactrim, appreciate ID recs.  - biliary drain removed 3/26  - SCDs, Lovenox for VTE ppx (hold therapeutic anticoagulation)   - Dispo: Pending clinical course, adequate nutrition/PO intake    ASheryn Bison MD  06/02/2017     8:39 AM   General Surgery Resident      HPB and GI Surgery Attending for Dr. GRon Agee    I saw and evaluated the patient. I have reviewed and edited the resident's/fellow's note and confirm the findings and plan of care as documented above. Stable.  Charolotte Eke, MD

## 2017-06-03 ENCOUNTER — Inpatient Hospital Stay: Payer: Medicare (Managed Care)

## 2017-06-03 ENCOUNTER — Other Ambulatory Visit: Payer: Self-pay | Admitting: Cardiology

## 2017-06-03 LAB — PROTIME-INR
INR: 1.3 — ABNORMAL HIGH (ref 0.9–1.1)
Protime: 14.8 s — ABNORMAL HIGH (ref 10.0–12.9)

## 2017-06-03 LAB — TRIGLYCERIDES: Triglycerides: 200 mg/dL — AB

## 2017-06-03 LAB — CBC AND DIFFERENTIAL
Baso # K/uL: 0.1 10*3/uL (ref 0.0–0.1)
Basophil %: 1.2 %
Eos # K/uL: 0.3 10*3/uL (ref 0.0–0.4)
Eosinophil %: 3.3 %
Hematocrit: 25 % — ABNORMAL LOW (ref 34–45)
Hemoglobin: 7.6 g/dL — ABNORMAL LOW (ref 11.2–15.7)
IMM Granulocytes #: 0.1 10*3/uL
IMM Granulocytes: 0.6 %
Lymph # K/uL: 1.4 10*3/uL (ref 1.2–3.7)
Lymphocyte %: 13.4 %
MCH: 29 pg/cell (ref 26–32)
MCHC: 31 g/dL — ABNORMAL LOW (ref 32–36)
MCV: 93 fL (ref 79–95)
Mono # K/uL: 0.9 10*3/uL (ref 0.2–0.9)
Monocyte %: 8.9 %
Neut # K/uL: 7.4 10*3/uL — ABNORMAL HIGH (ref 1.6–6.1)
Nucl RBC # K/uL: 0 10*3/uL (ref 0.0–0.0)
Nucl RBC %: 0 /100 WBC (ref 0.0–0.2)
Platelets: 609 10*3/uL — ABNORMAL HIGH (ref 160–370)
RBC: 2.7 MIL/uL — ABNORMAL LOW (ref 3.9–5.2)
RDW: 17.2 % — ABNORMAL HIGH (ref 11.7–14.4)
Seg Neut %: 72.6 %
WBC: 10.2 10*3/uL — ABNORMAL HIGH (ref 4.0–10.0)

## 2017-06-03 LAB — EKG 12-LEAD
P: 35 deg
P: 40 deg
P: 39 deg
PR: 172 ms
PR: 172 ms
PR: 176 ms
QRS: 17 deg
QRS: 23 deg
QRS: 34 deg
QRSD: 66 ms
QRSD: 70 ms
QRSD: 74 ms
QT: 340 ms
QT: 352 ms
QT: 360 ms
QTc: 432 ms
QTc: 452 ms
QTc: 458 ms
Rate: 97 {beats}/min
Rate: 99 {beats}/min
Rate: 97 {beats}/min
Severity: BORDERLINE
Severity: BORDERLINE
Severity: BORDERLINE
Statement: BORDERLINE
Statement: BORDERLINE
Statement: BORDERLINE
T: 18 deg
T: 26 deg
T: 37 deg

## 2017-06-03 LAB — COMPREHENSIVE METABOLIC PANEL
ALT: 45 U/L — ABNORMAL HIGH (ref 0–35)
AST: 62 U/L — ABNORMAL HIGH (ref 0–35)
Albumin: 2.7 g/dL — ABNORMAL LOW (ref 3.5–5.2)
Alk Phos: 526 U/L — ABNORMAL HIGH (ref 35–105)
Anion Gap: 15 (ref 7–16)
Bilirubin,Total: <0.2 mg/dL (ref 0.0–1.2)
CO2: 24 mmol/L (ref 20–28)
Calcium: 9.3 mg/dL (ref 8.6–10.2)
Chloride: 96 mmol/L (ref 96–108)
Creatinine: 0.74 mg/dL (ref 0.51–0.95)
GFR,Black: 94 *
GFR,Caucasian: 82 *
Glucose: 125 mg/dL — ABNORMAL HIGH (ref 60–99)
Lab: 21 mg/dL — ABNORMAL HIGH (ref 6–20)
Potassium: 4.7 mmol/L (ref 3.3–5.1)
Sodium: 135 mmol/L (ref 133–145)
Total Protein: 7.3 g/dL (ref 6.3–7.7)

## 2017-06-03 LAB — MAGNESIUM: Magnesium: 1.8 mg/dL (ref 1.6–2.5)

## 2017-06-03 LAB — PHOSPHORUS: Phosphorus: 5.8 mg/dL — ABNORMAL HIGH (ref 2.7–4.5)

## 2017-06-03 LAB — POCT GLUCOSE: Glucose POCT: 140 mg/dL — ABNORMAL HIGH (ref 60–99)

## 2017-06-03 LAB — PREALBUMIN: Prealbumin: 14 mg/dL — ABNORMAL LOW (ref 20–40)

## 2017-06-03 MED ORDER — METHADONE HCL 5 MG PO TABS *I*
5.0000 mg | ORAL_TABLET | Freq: Every day | ORAL | Status: DC
Start: 2017-06-03 — End: 2017-06-14
  Administered 2017-06-03 – 2017-06-13 (×11): 5 mg via ORAL
  Filled 2017-06-03 (×11): qty 1

## 2017-06-03 MED ORDER — MAGNESIUM OXIDE 80 MG/ML COMPOUNDED ORAL SUSPENSION *I*
400.0000 mg | Freq: Once | ORAL | Status: AC
Start: 2017-06-03 — End: 2017-06-03
  Administered 2017-06-03: 400 mg via NASOGASTRIC
  Filled 2017-06-03: qty 5

## 2017-06-03 MED ORDER — PROSOURCE NO CARB PO LIQD *I*
30.0000 mL | Freq: Every day | ORAL | Status: DC
Start: 2017-06-04 — End: 2017-06-10
  Administered 2017-06-04 – 2017-06-08 (×3): 30 mL via ORAL

## 2017-06-03 NOTE — Consults (Signed)
Medical Nutrition Therapy Brief Note:    Page received requesting bolus recs: Recommend Osmolite 1.5 270 ml QID with FWF 30 ml before and after each feed. Recommend 30 ml prosource daily. Please see RD note from earlier today (4/1) for full assessment.     Laurence Aly, [place]  Pager [phone]

## 2017-06-03 NOTE — Progress Notes (Signed)
06/03/17 0900   [facility] Patient Class Review   Patient Class Review Inpatient     Patient Class Effective 03/29/2017    Mosetta Putt, RN  Pager: [phone]

## 2017-06-03 NOTE — Progress Notes (Signed)
Visited pt and dtr [name] briefly, pt looking well sitting up in recliner and reports she had walker around unit earlier. Pt still w/ poor appetite but overall much brighter, smiling and hopeful. Received suppository today, last stool 3/31. Received dilaudid prn x 2 yesterday and x 1 today, pain controlled. Dtr is hopeful pt may be considered for transfer to [facility] acute rehab and be able to keep dubhoff for night time or bolus feedings.     We will continue to follow along for support. Please call for any questions/concerns.    Spent 20 minutes with >50% time in coordination and collaboration of care.    Elroy Channel NP  Palliative Care Consult Service  Pager # [phone]

## 2017-06-03 NOTE — Plan of Care (Signed)
Bowel Elimination     Elimination patterns are normal or improving Maintaining        Fluid and Electrolyte Imbalance     Fluid and Electrolyte imbalance Maintaining        GI Bleeding Elimination     Elimination of patterns are normal or improving Maintaining        Mobility     Functional status is maintained or improved - Geriatric Maintaining     Patient's functional status is maintained or improved Maintaining        Nutrition     Patient's nutritional status is maintained or improved Maintaining     Nutritional status is maintained or improved - Geriatric Maintaining        Pain/Comfort     Patient's pain or discomfort is manageable Maintaining        Post-Operative Bowel Elimination     Elimination pattern is normal or improving Maintaining        Post-Operative Hemodynamic Stability     Maintain Hemodynamic Stability Maintaining        Psychosocial     Demonstrates ability to cope with illness Maintaining        Safety     Patient will remain free of falls Maintaining

## 2017-06-03 NOTE — Progress Notes (Signed)
Infectious Diseases (Team 3) Follow Up Note      Personally reviewed chart, labs, medications  Patient seen.     CC:  F/U Enterobacter cloacae bacteremia, Intra-abdominal abscesses positive for VRE, enterobacter and candida glabrata    Subjective:  "Feeling a little better, a little stronger", still struggles to eat, cyclic TFs continue, denies N/V/D, F/C.  Tenderness to palpitation RUQ on exam. Daughter at bedside.     ROS: Reviewed 6 systems in detail, see above    Current Meds:  Scheduled Meds:   methadone  5 mg Oral Daily    [START ON 06/04/2017] PROSOURCE NO CARB  30 mL Oral Daily with breakfast    triamterene-hydrochlorothiazide  1 tablet Oral QAM    bacitracin zinc-polymyxin B sulfate   Topical 2 times per day    lidocaine  1 patch Transdermal Q24H    methadone  7.5 mg Oral BID    polyethylene glycol  17 g Oral Daily    sulfamethoxazole-trimethoprim  1 tablet Oral 2 times per day    methylphenidate  2.5 mg Oral BID    fluconazole  800 mg Oral Daily    insulin glargine  6 Units Subcutaneous Nightly    pantoprazole  40 mg Oral BID AC    senna  2 tablet Oral 2 times per day    bisacodyl  10 mg Rectal Daily    enoxaparin  40 mg Subcutaneous Daily    DULoxetine  20 mg Oral Daily    metoclopramide  10 mg Oral 4x Daily AC & HS    docusate sodium  200 mg Oral 2 times per day    acetaminophen  1,000 mg Oral Q8H    levothyroxine  137 mcg Oral Daily     Continuous Infusions:    PRN Meds:.   calcium carbonate  1,000 mg Oral BID PRN    HYDROmorphone PF  0.5 mg Intravenous Q1H PRN    Or    HYDROmorphone PF  1 mg Intravenous Q1H PRN    LORazepam  0.5 mg Oral Q6H PRN    heparin lock flush  50 Units Intracatheter PRN    ondansetron  4 mg Intravenous Q6H PRN       Objective:    BP: (106-144)/(62-82)   Temp:  [35.7 C (96.3 F)-36.4 C (97.5 F)]   Temp src: [place] (04/01 1231)  Heart Rate:  [73-111]   Resp:  [16-20]   SpO2:  [92 %-98 %]     General Appearance:  NAD, walking back from [place] with walker and 1  assist, smiling and pleasant during visit.   HEENT:MMM, oropharynx clear; Dobhoff.  Pulm: diminished bases, no adventitious sounds  CV: RRR, Normal S1S2, no M/R/G  Abdomen:  Distended, firm across epigastric/upper quads, soft lower quads, RUQ TTP, normoactive bowel sounds  Extremities: MAE, WWP  Skin:Warm and dry to touch; no rashes  Neuro: alert and oriented x4, speech soft, slow, fluent; no obvious focal deficits  Lines/Drains/Tubes: right chest Mediport/accessed, site benign      Lab results: 06/03/17  0518  05/11/17  0045 05/10/17  0042  04/23/17  2343  04/21/17  2348   WBC 10.2*  < > 11.2* 11.1*  < > 18.8*  < > 15.5*   Hemoglobin 7.6*  < > 7.9* 7.9*  < > 8.9*   9.4*  < > 8.7*   Hematocrit 25*  < > 25* 25*  < > 27*  < > 26*   RBC 2.7*  < >  2.7* 2.7*  < > 3.0*  < > 3.0*   Platelets 609*  < > 513* 517*  < > 340  < > 353   Neut # K/uL 7.4*  < > 9.6* 8.6*  < > 16.0*  < > 14.4*   Lymph # K/uL 1.4  < > 0.9* 1.3  < > 1.5  < > 0.6*   Mono # K/uL 0.9  < > 0.5 0.6  < > 0.3  < > 0.3   Eos # K/uL 0.3  < > 0.0 0.6*  < > 0.2  < > 0.0   Baso # K/uL 0.1  < > 0.0 0.0  < > 0.0  < > 0.0   Seg Neut % 72.6  < > 86.0 75.3  < > 84.4  < > 90.8   Lymphocyte % 13.4  < > 7.0 10.5  < > 7.8  < > 4.2   Monocyte % 8.9  < > 4.0 5.3  < > 1.7  < > 1.7   Eosinophil % 3.3  < > 0.0 5.3  < > 0.9  < > 0.0   Basophil % 1.2  < > 0.0 0.0  < > 0.0  < > 0.0   Bands %  --   --   --  2  < > 1  < > 2   Myelocyte %  --   --  1*  --   < > 1*  < >  --    Metamyelocyte %  --   --  1 1  < > 2*  < > 1   Promyelocyte %  --   --   --   --   --  2*  --   --    Blasts %  --   --   --   --   --   --   --  1*   < > = values in this interval not displayed.      Recent Labs  Lab 06/03/17  0518 05/31/17  0622 05/29/17  0019   Sodium 135 136 134   Potassium 4.7 4.3 4.3   CO2 _0 UN 21* 17 16   Creatinine 0.74 0.61 0.71   Glucose 125* 112* 111*   Calcium 9.3 8.3* 8.5*         Lab results: 06/03/17  0518 05/31/17  0622 05/29/17  0019 05/27/17  0019 05/24/17  2350    Total Protein 7.3 6.4 6.7 7.1 7.1   Albumin 2.7* 2.2* 2.3* 2.4* 2.4*   ALT 45* 41* 45* 47* 49*   AST 62* 44* 53* 56* 51*   Alk Phos 526* 453* 417* 396* 428*   Bilirubin,Total <0.2 <0.2 <0.2 <0.2 0.2     Micro:   1/25: Intraoperative bile: GS 0 pmns, no organisms, Cx: no growth  2/12: 1 set of blood cultures from the right Mediport (no peripheral bld was sent): positive for Enterobacter cloacae complex in 70 hrs, sensitive to zosyn.   2/18 abdominal GS had >25 PMNs, many GPC in pairs/chains, many GNB and cultures 4+ Enterobacter cloacae complex. The cultures also grew 1+ Candida glabrata   2/21: BCx from the Right peripheral IV, mediport, L IJ:  No growth  05/03/17: IR-guided I&D with drain placement of the left upper quadrant collection: GS > 25 PMNs, many GNB and GN diplococci, Cultures with 4+ Enterobacter cloacae complex (resistant to Zosyn and CTX), C. Albicans  and C. Glabrata, and Candida tropicalis  05/04/17:    1 set of blood cultures from left IJ: VRE at 15.3 TTP. Daptomycin sensitivities: MIC 98mg/ml (dose dependent),  Linezolid   1 set from periphery: VRE and Enterobacter cloacae at 14.8 TTP   05/05/17:  IR-guided I&D with drain placement of the intrahepatic collection: GS 1-10 PMNs, very few GPC in pairs. Fungal cultures with Candida glabrata  05/05/17: Blood cultures from periphery, Mediport and IJ: no growth  05/04/17: Urine cultures obtained (for unclear reason): no growth  05/08/17: Abscess GS: >25 PMNs, many GPC in chains, few GNB. Aerobic cultures growing 2+ Enterobacter (ertapenem, Cipro, TMP/SMX-sensitive) and 3+VRE, Fungal: C. Glabrata (sens to caspo, vori and dose dependent sens to fluconazole)      Imaging/Other Relevant Diagnostics:   05/23/17 CT abd/pelvis with contrast:   1. Again noted are several communicating multiloculated fluid collections in the left upper quadrant post percutaneous drainage removal. These collections have slightly increased in size compared with 05/14/2017.  2. Reaccumulation of  the fluid collection along the anterior margin of the left hepatic lobe, status post percutaneous drainage removal.  3. Slight interval decrease in size of an irregular fluid collection inferior right hepatic lobe. Other stable low-attenuation lesions unchanged in size.   4. Unchanged moderate left pleural effusion with left lower lobe atelectasis. Unchanged consolidation in the right lower lobe with bronchiectasis.  5. Unchanged periductal edema and focal fluid collection along the right internal/external biliary drain.      Assessment and Plan:  ID problem(s):   -Enterobacter cloacae bacteremia  -Multiple and extensive Intra-abdominal abscesses growing VRE, enterobacter and candida glabrata    89yoold female with pancreatic cancer, s/p Whipple procedure on 03/29/17 after neoadjuvant chemotherapy and radiation. Her post op course was c/b delayed gastric emptying, gastric distension and obstruction and a large surgical bed hematoma causing CBD and hepatic vein compression, severe biliary sepsis, s/p placement of a right hepatic duct external biliary drain placed on 2/12, found to have enterobacter cloacae bacteremia (2/12) presumed to be secondary to infection of the hematoma. However CT of abdomen (2/18) showed the hematoma had resolved but noted to have a well-circumscribed and larger anterior abdominal collection percutaneously, which was aspirated (2/19) and cultures grew enterobacter cloacae and Candida glabrata. ID service consulted on 2/21 with question of treating C.glabrata.  Caspofungin was started and a perc drain was placed LUQ 3/1, then anterior abdominal perc drain placed 3/3, and then anterior perc drain into left hepatic collection 05/08/17, the goal being source control with drain placements. She had been on Zosyn but her enterobacter became (amp-C inducible) thus switched to ertapenem and Daptomycin added on 05/04/17 for continued broad coverage.  Caspofungin was switched to oral high-dose fluconazole  ('12mg'$ /kg) daily once sensitivities returned on C.glabrata. Pt on methadone for pain control, QTc 409. Abdominal perc drains where removed once drainage was minimal, biliary tube was removed last week. However, there remained several communicating multiloculated fluid collections in LUQ and some areas of reaccumulation on 05/23/17 CT of a/p.  Discussion with ID attending Dr. GEdgar Friskand Dr. GRon Ageelast week resulted in transitioning patient over to oral therapy, ie, Bactrim for enterobacter and to remain on high dose fluconazole for C. Glabrata, with plan to reimage to follow fluid collections.   Linezolid was considered for VRE but ultimately was not a good option for her given risk vs benefits 2/2 to multiple drug-to-drug interactions.      Currently SMeghnais tolerating Bactrim and fluconazole,  remains afebrile and is feeling a bit stronger.  SNF rehab is planned but her Dobhoff remains a barrier.   No need for OPAT to follow once discharged, will be seen in [facility] follow repeat scan.    Recommendations/plan:   Continue oral fluconazole 12 mg/kg (round up to 800 mg daily) and Bactrim DS 1 tablet every 12 hours daily, duration TBD pending re-imaging of abdomen   Repeat contrasted CT abdomen/pelvis in 2 weeks (06/14/17).     Please arrange for CBC with diff and CMP on 06/12/17, prior to contrasted CT.    Patient has an appointment in ID outpatient clinic for hospital f/u on 06/18/17 with  Dr. Nancy Nordmann at 10:30 and Dr. Marjory Lies at 11:00.      ID will sign off now. Thank you for allowing [name] to participate in the care of this patient.  Please call with questions or if there are any further infectious problems.    Lonzo Candy, ANP-BC  Infectious Diseases, Team 3  Pager [phone]

## 2017-06-03 NOTE — Progress Notes (Signed)
Surgical Oncology/Hepatobiliary Surgery Progress Note     LOS: 66 days     Subjective:  NAEO. WBC 10.2, stable from previous. PO intake steadily increasing, 520cc (480). Pt complaining of abdominal pain and fullness limiting PO. Pt still complaining of superficial calf tenderness, redness. Improved from previous. Continue SNF planning with DHT.    Objective:  Vitals Sign Ranges for Past 24 Hours:  BP: (106-116)/(62-70)   Temp:  [35.7 C (96.3 F)-36.4 C (97.5 F)]   Temp src: Temporal (04/01 0453)  Heart Rate:  [72-98]   Resp:  [16-20]   SpO2:  [92 %-98 %]     Physical Exam:  General Appearance: in NAD, resting in bed comfortably  Cardiac: regular rate  Respiratory: non-labored breathing on room air  Abdomen: Soft, minimally tender to palpation, non distended, incision well healed   Extremities: warm, erythema of the bilateral anterior calves with small amount of skin breakdown, minimal edema no posterior calf tenderness     Labs:    CBC:    Recent Labs  Lab 06/03/17  0518 05/31/17  0622 05/30/17  0111 05/29/17  0019 05/27/17  2350   WBC 10.2* 8.7 9.5 9.8 11.5*   Hemoglobin 7.6* 6.9* 6.8* 6.9* 6.9*   Hematocrit 25* 23* 22* 22* 23*   Platelets 609* 540* 551* 577* 161*       Metabolic Panel:    Recent Labs  Lab 05/31/17  0622 05/29/17  0019   Sodium 136 134   Potassium 4.3 4.3   Chloride 99 95*   CO2 27 27   UN 17 16   Creatinine 0.61 0.71   Glucose 112* 111*   Calcium 8.3* 8.5*   Magnesium 1.6 1.8   Phosphorus 4.9* 5.8*          Assessment:  72 y.o. female with h/o recent PE and pancreatic cancer POD # 68  status post whipple procedure complicated by delayed gastric emptying.  NGT replaced on 04/04/17. UGI on 04/12/17 concerning for obstruction just beyond the [place] in the efferent limb.  Course complicated on 0/96/04 by rising LFTs and sepsis with CT concerning for cholangitis given biliary tree obstruction and PV compression due to hematoma and afferent limb distention in setting of PE treatment. Is s/p IR PTC on  04/16/17 and IR percutaneous aspiration of anterior abdominal fluid collection on 04/24/17. IR LUQ perc drain placement 3/1. IR anterior abdominal 42f perc drain placement 3/3. IR anterior perc drain into left hepatic collection 3/6.    Plan:  - Polysporin and compression stockings to bilateral calves. Continue to monitor.  - Unable to safely place PEG per Thoracic and GI  - Regular diet + ensures  - Continue to encourage PO/enteral nutrition, TFs to goal - now cycled over 12 hours.  - Aggressive bowel regimen  - CT scan 3/21 with intra-abd collections, new portal venous collection. However, patient remains afebrile, clinically stable, WBCs stable. No plans for drain at this time. On diflucan/bactrim, appreciate ID recs. Plan for repeat CT Scan (04/11).  - biliary drain removed 3/26  - SCDs, Lovenox for VTE ppx (hold therapeutic anticoagulation)   - Dispo: Pending clinical course, adequate nutrition/PO intake. SNF Pending w/ DHT.    KBishop Dublin MD  06/03/2017     6:16 AM   General Surgery Resident

## 2017-06-03 NOTE — Consults (Addendum)
PHYSICAL MEDICINE & REHABILITATION CONSULT NOTE:    Patient Identification   Amanda Galloway 72 y.o. female  Date of Birth: [birth year]  Admit Date:  03/29/2017  Attending Provider:  Delano Metz, MD  Primary Care Physician: Provider, None     Date of [facility] Admission: 03/29/2017     Reason for Consult: 512 admission  Consult seen in request of: Delano Metz, MD    History of present illness:     Amanda Galloway is a 72 y.o. female w/ PMH fibromyalgia, pancreatic CA s/p Whipple 03/29/17 after neoadjuvant chemotherapy and radiation. Hospital course c/b ICU w/ intub for resp failure & septic shock, post op course was c/b delayed gastric emptying, gastric distension and obstruction and a large surgical bed hematoma causing CBD and hepatic vein compression, severe biliary sepsis, s/p placement of a right hepatic duct external biliary drain placed on 2/12, found to have enterobacter cloacae bacteremia (2/12) presumed to be secondary to infection of the hematoma. For infection control (s/p IR PTC on 04/16/17 and IR percutaneous aspiration of anterior abdominal fluid collection on 04/24/17. IR LUQ perc drain placement 3/1. IR anterior abdominal 39f perc drain placement 3/3. IR anterior perc drain into left hepatic collection 3/6), FTT requiring NGT placement and enteral feeding. Multiple services consulted for PEG placement but no safe window for PEG placement.    Palliative care consulted and following for symptoms, started methadone 2/19, weaned dose 2/23 d/t concern for sedation. S/p significant constipation requiring bowel clean out (s/p enema & methylnaltrexone). Trial ritalin 1x/d on 3/7 w/ fair effect, much better result noted 3/8 w/ 2x/d dosing, more interactive when ritalin restarted 3/21.   Pt w/ ongoing poor po intake, now tolerating full cyclic fds via dubhoff fdg tube & unfortunately GI eval indicates no window to safely get PEG. Goal dispo SNF Rehab.    ID following for antibiotic recommendations for  well-circumscribed and larger anterior abdominal collection percutaneously, which was aspirated (2/19) and cultures grew enterobacter cloacae and Candida glabrata. Patient transitioned from daptomycin and ertapenem to PO fluconazole and Bactrim as of 05/30/17 with plan to repeat CT abd/pelvis in 2 weeks. Duration of abx therapy pending imaging findings.      A comprehensive 10 system multisystem review of systems was reviewed and negative other than pertinent positives/negatives noted in HPI or below.  Positives: lightheadedness, fatigue, weakness, abdominal pain, nausea, left shoulder pain, skin tears, bowel/bladder incontinence, lower extremity edema  Negatives: pressure injury    Continence:  Bowel - incontinent  Bladder - incontinent    Medications (scheduled)   methadone  5 mg Oral Daily    [START ON 06/04/2017] PROSOURCE NO CARB  30 mL Oral Daily with breakfast    triamterene-hydrochlorothiazide  1 tablet Oral QAM    bacitracin zinc-polymyxin B sulfate   Topical 2 times per day    lidocaine  1 patch Transdermal Q24H    methadone  7.5 mg Oral BID    polyethylene glycol  17 g Oral Daily    sulfamethoxazole-trimethoprim  1 tablet Oral 2 times per day    methylphenidate  2.5 mg Oral BID    fluconazole  800 mg Oral Daily    insulin glargine  6 Units Subcutaneous Nightly    pantoprazole  40 mg Oral BID AC    senna  2 tablet Oral 2 times per day    bisacodyl  10 mg Rectal Daily    enoxaparin  40 mg Subcutaneous Daily    DULoxetine  20  mg Oral Daily    metoclopramide  10 mg Oral 4x Daily AC & HS    docusate sodium  200 mg Oral 2 times per day    acetaminophen  1,000 mg Oral Q8H    levothyroxine  137 mcg Oral Daily        Medications (PRN)  HYDROmorphone, naloxone, sodium chloride, dextrose, calcium carbonate, heparin lock flush, ondansetron    Allergies:  No Known Allergies (drug, envir, food or latex)       Past medical Hx:  Past Medical History:   Diagnosis Date    Acute kidney failure ([place])  03/31/2017    Cancer ([place])     Depression     Fibromyalgia     Hypothyroidism        Past surgical hx:  Past Surgical History:   Procedure Laterality Date    CHOLECYSTECTOMY      CHOLECYSTECTOMY, LAPAROSCOPIC  09/06/2016    HYSTERECTOMY  08/1986    Fibroids    KNEE SURGERY Right     PR INSERT TUNNELED CV CATH WITH PORT Right 10/04/2016    Procedure: Right IJ MEDIPORT Insertion;  Surgeon: Delano Metz, MD;  Location: [facility] MAIN OR;  Service: Oncology General    PR LAP,DIAGNOSTIC ABDOMEN N/A 10/04/2016    Procedure: LAPAROSCOPY DIAGNOSTIC;  Surgeon: Delano Metz, MD;  Location: [facility] MAIN OR;  Service: Oncology General    PR PART [place] PANC,PROX+REMV DUOD+ANAST N/A 03/29/2017    Procedure: WHIPPLE PROCEDURE;  Surgeon: Delano Metz, MD;  Location: [facility] MAIN OR;  Service: Oncology General    ROTATOR CUFF REPAIR Right     TONSILLECTOMY AND ADENOIDECTOMY         Family hx:  Family History   Problem Relation Age of Onset    Breast cancer Mother         died 39    Cancer Father         NHL, died 36    Heart Disease Father     Diabetes Sister     Obesity Sister         4 siblings       Social History:  Social History   Substance Use Topics    Smoking status: Never Smoker    Smokeless tobacco: Never Used    Alcohol use No     Occupation: retired  Smoking:  no  Alcohol:  no  Illicit drugs: no       Functional Hx:   Premorbid Functional Status: independent mobility and ADLs  Support System and Family Circumstances (i.e., potential caregivers): Lives with daughter  Home Environment / Accessibility: 2 story home, 2 STE, 1/2 bath on first floor, TSC on snd floor, bedroom on 2nd floor. Not able to have first floor setup       Current Functional Status:  PT 06/03/17  Bed Mobility   Bed mobility Tested   Supine to Sit Contact guard;1 person assist;Verbal cues;Side rails down;Head of bed flat   Sit to Supine Not tested   Additional comments Pt required encouragement to complete without railing. She was able to bring BLE towards edge  of bed to prepare. When completing supine to long sit positioning pt required CGA at trunk during transition of propping onto elbows then to BUE.   Transfers   Transfers Tested   Sit to Stand Moderate ;1 person assist   Stand to sit Contact guard;1 person assist   Transfer Assistive Device rolling walker   Additional comments Pt up  from edge of bed, recliner chair, and toilet. From edge of bed, pt required modAx1 with assist at trunk during forward weight shift and transition from UE on bed to RW. From toilet and recliner pt able to complete with minAx1 likely d/t arm rest assisting with transfer. PT continued to requried assist for forward weight shift.   Mobility   Mobility Tested   Gait Pattern Decreased cadence;Foot flat L;Foot flat R;Decreased R step length;Decreased R step height;Decreased L step length;Decreased L step height   Ambulation Assist Contact guard;1 person assist   Ambulation Distance (Feet) 2x87f   Ambulation Assistive Device rolling walker   Stairs Assistance Minimal;1 person assist   Stair Management Technique One rail;Step to pattern;Forwards   Number of Stairs 2 trials x 4 steps   Additional comments Pt ambulated at slow gait speed and required CGA at trunk for stabiltiy and atnerolateral weight shift. VCs for positoining within RW and heel strike. Pt also requried increased VCs when negoitating smaller areas such as bathroom. PT able to ascned/descend stairs requiring minAx1 d/t decreased eccentric control. Pt experienced 1 mild LOB posteriorly requiring assist to prevent fall.   Balance   Balance Tested   Sitting - Static Independent ;Supported   Standing - SCytogeneticistguard;Unsupported   Standing - Dynamic Min assist;Supported         OT 05/09/17  ADL Assessment   Grooming Minimal Assist  (clinical estimate)   UE Dressing Maximum Assist   Where UE Dressing Assessed Edge of bed   Assist Needed With: Shirt around back;Threading arm in sleeve;Set up;Verbal cues   LE Dressing Maximum Assist    Where  LE Dressing Assessed Edge of bed   Assist Needed With: Setup;Pants over feet;Decreased balance;Pants up to waist;Increased time;Socks;Shoes   Bathing Maximal Assist  (clinical estimate (max A for both UB/LB))   Toileting Dependent  (clinical estimate)   Additional Comments Encouraged pt to participate in dressing task EOB.  Crossed R LE over the L in attempt to don sock however unable to complete without max A and CGA for dynamic sitting balance.  Assistance needed to don UE into sleeve and around back.  Clinical estimate provided for all other self care tasks based on dressing and functional mobility at this time.         Speech/swallow status: Diet tube feeding with tray ([facility] Regular  Dietary modular: ProSource NoCarb Liquid Protein 30 mL    Weight bearing restrictions: none    Objective:   Blood pressure 112/68, pulse 99, temperature 36.2 C (97.2 F), temperature source Foley, resp. rate 16, height 1.702 m (_0 ), weight 79.8 kg (175 lb 14.4 oz), SpO2 98 %.      GEN: NAD, in hospital gown  HEENT: NC/AT, EOMI, MM dry, oral thrush  HEART: RRR, no audible m/r/g  LUNGS: Normal respiratory effort on RA.  ABDOMEN: distended  SKIN/EXT: RUE/RLE allevyn cdi. B/l TEDs. 1+ BLE edema.    NEURO/MSK:  Mental status: Awake, appears tired. Fully oriented to person, situtation. April, 2019.  Speech: fluent. Regular rate and rhythm.  Comprehension and naming: intact  Repetition: intact  Registration: 3/3   5 minute recall: 1/3, with cues    Serial 3's/7's: intact - slowed performance  Apraxia: absent   Neglect: absent   Cranial Nerves:       II: The pupils are equal, round, and reactive to light pupils 3/3 to 2/2, fields intact     III/IV/VI: Versions intact without nystagmus, no gaze  preference    V: Facial sensation symmetric to light touch    VII: Facial expression symmetric    VIII: Hearing intact to voice    IX/X: Palate elevates symmetrically    XI: Shoulder shrug symmetric    XII: Tongue protrudes midline      Motor strength  Right  Left    C5  Shoulder abductors  4 4 (pain)   C5  Elbow flexors  4 4   C6  Wrist extensor  4 4   C7  Elbow extensor  4 4   C8  Finger flexors  4 4   T1  Finger abductor  4 4   L2  Hip flexors  4 4   L3  Knee extensors  4 4   L4  Ankle dorsiflexors  4 4   L5  Extensor hallucis longus  4 4   S1  Ankle plantar flexors  4 4     Sensation:  LT intact       Clonus: No    Reflexes  Right  Left    Biceps  3 3   Triceps  3 3   Brachioradialis  3 3   Knee  3 3   Ankles  2 2     Babinski reflex: right foot indeterminate    left foot indeterminate       Labs    Recent Labs  Lab 06/03/17  0518 05/31/17  0622 05/30/17  0111   WBC 10.2* 8.7 9.5   Hemoglobin 7.6* 6.9* 6.8*   Hematocrit 25* 23* 22*   Platelets 609* 540* 551*        Recent Labs  Lab 06/03/17  0518 05/31/17  0622 05/29/17  0019   Sodium 135 136 134   Potassium 4.7 4.3 4.3   CO2 _0 UN 21* 17 16   Creatinine 0.74 0.61 0.71   Glucose 125* 112* 111*   Calcium 9.3 8.3* 8.5*       Recent Labs  Lab 06/03/17  0518   INR 1.3*       No components found with this basename: PT    Recent Labs  Lab 05/29/17  0019   CK 82       Recent Labs  Lab 06/03/17  0518 05/31/17  0622 05/29/17  0019   AST 62* 44* 53*   ALT 45* 41* 45*       No components found with this basename: PROT, ALBU, BILITOT, BILIDIR, ALKPHOS     Imaging:    No results found.    Impression & Recommendations:     Amanda Galloway is a 72 y.o. female w/ PMH fibromyalgia, pancreatic CA s/p Whipple 03/29/17 after neoadjuvant chemotherapy and radiation with complicated, prolonged hospital course including intraabdominal infections and failure to thrive picture (currently with Dobbhoff for tube feeds) resulting in debility.        Patient is not an appropriate candidate for inpatient rehab due to lack of definitive feeding plan and need for longer course of rehab.  Would recommend further rehabilitation at a skilled nursing facility.    - Continue floor therapies. Encourage OOB to chair at least 3  hours/day as tolerated.  - Agree with transition to bolus tube feeds to supplement PO intake and hopefully stimulate appetite.    PM&R will sign off.  - If able to meet caloric needs with PO diet only and making substantial gains with PT/OT in that she would be  able to safely traverse full flight of stairs with assistance from daughter with short rehab stay, please re-consult PM&R.        Thank you very much for this consult and allowing [name] to participate in this patient's care.   Plan communicated with primary team.       Patient seen, evaluated, and discussed with PM&R attending, Dr. Sharilyn Sites, DO 06/03/2017 1:13 PM   Rehab Attending  I saw and evaluated the patient. I have reviewed and edited Dr Flonnie Hailstone note and confirm the findings and rec as above.    Cyndi Lennert, MD 06/03/2017 3:42 PM

## 2017-06-03 NOTE — Plan of Care (Signed)
Bowel Elimination     Elimination patterns are normal or improving Progressing towards goal        Fluid and Electrolyte Imbalance     Fluid and Electrolyte imbalance Progressing towards goal        GI Bleeding Elimination     Elimination of patterns are normal or improving Progressing towards goal        Mobility     Functional status is maintained or improved - Geriatric Progressing towards goal     Patient's functional status is maintained or improved Progressing towards goal        Nutrition     Patient's nutritional status is maintained or improved Progressing towards goal     Nutritional status is maintained or improved - Geriatric Progressing towards goal        Pain/Comfort     Patient's pain or discomfort is manageable Progressing towards goal        Post-Operative Bowel Elimination     Elimination pattern is normal or improving Progressing towards goal        Post-Operative Hemodynamic Stability     Maintain Hemodynamic Stability Progressing towards goal        Psychosocial     Demonstrates ability to cope with illness Progressing towards goal        Safety     Patient will remain free of falls Progressing towards goal

## 2017-06-03 NOTE — Progress Notes (Signed)
SW discussed with team barrier of placing pt with Dobbhoff tube. Explained that SNF does not take Dobbhoff tube. SW checked with placement office and SW on 712 re: Dobbhoff tube placement in SNF and both confirmed that this is not the case.     Per medical team pt is unable to have a PEG tube placed at this time.    Discussed possible 512 consult with team and PT. PT in agreement that pt could be possible 512 candidate. Team ordered consult.     512 came to see pt and determined that since the feeding tube is anticipated to be needed longer than two weeks, then the next step would be SNF rehab which cannot accommodate Dobbhoff tube. It is not felt hat pt will improve enough in 1-2 weeks to be able to return home with pts daughter. Therefore pt is not a candidate for 512 at this time.    Will continue to follow and assist as needed. Will continue to provide supportive contacts to pt and family.

## 2017-06-03 NOTE — Consults (Signed)
Medical Nutrition Therapy - Follow Up    Admit Date: 03/29/2017    Patient Summary: 73 y.o. female with h/o recent PE and pancreatic cancer status post whipple procedure complicated by delayed gastric emptying.  NGT replaced on 04/04/17. UGI on 04/12/17 concerning for obstruction just beyond the [place] in the efferent limb.  Course complicated on 08/21/48 by rising LFTs and sepsis with CT concerning for cholangitis given biliary tree obstruction and PV compression due to hematoma and afferent limb distention in setting of PE treatment. Is s/p IR PTC on 04/16/17 and IR percutaneous aspiration of anterior abdominal fluid collection on 04/24/17. IR LUQ perc drain placement 3/1. IR anterior abdominal 61f perc drain placement 3/3. IR anterior perc drain into left hepatic collection 3/6.    Pertinent Meds: levothyroxine, bowel regimen including daily suppository, Reglan, duloxetine, Protonix, lantus, Diflucan, Ritalin, Bactrim, methadone, Maxzide, Mag-ox   Pertinent Labs: PO4 5.8 (H)    Reviewed I/O's: BM x1, +300 ml yesterday (3/31)    Enteral or parenteral access: NGT     Food allergies: NKFA    Current diet: Regular   TF order: Osmolite 1.5 @ 90 ml/hr x 12 hours with min FWF 30 ml Q4H   Supplements: 30 ml ProSource BID, magic cup TID     Nutrition Focused Physical Exam:    Edema: +1 generalized, +1 BLE   Abdomen: rounded, surgical scar, tenderness/guarding, +BS, +flatus   Skin: poor turgor, abrasion to L elbow, skin tear elbow, skin tear to back     Anthropometrics:  Height: 170.2 cm (_0 )    Current Weight: 79.8 kg (175 lb 14.4 oz) (3/26); 110% IBW; 110% UBW  Ideal Body Weight: 72.4 kg + 10%  BMI: 27.5 kg/(m^2) overweight with edema noted   Weight Hx: weight gain likely fluid related, suspect weight has decreased since 3/26 due to decreased edema     05/28/2017 79.788 kg 175 lbs 14 oz Actual- trace/+1/+2   05/06/2017 65.817 kg 145 lbs 2 oz Actual- trace/+1    04/09/2017 73.483 kg 162 lbs Actual   03/29/2017 72 kg 158 lbs 12 oz  Actual       Estimated Nutrient Needs: (Based on 64.9 kg) *BW with minimal edema    1630-1950 kcal/day (25-30 kcal/kg)   78-97 g protein/day (1.2-1.5 g/kg)    1630-1950 mL fluid/day (25-30 mL/kg) or per team       Nutrition Assessment and Diagnosis:   Unable to safely place PEG per thoracic and GI. Multiple fluid collections, no safe window. Labs reviewed, phos increased 4.9--> 5.8. TFs changed to more concentrated formula to decrease reported feeling of fullness. TFs + prosource + flushes as ordered provides 1740 kcal, 98g protein and 1001 ml free water daily. Osmolite 1.5 is relatively lower in phosphorus, provides 1320 mg phosphorus. Pt reports no tolerance issues to tube feeds, however she still is not eating much. Intakes 0-25% per documentation. Reports drinking a lot of water. Writer encouraged po intake as tolerated.     Malnutrition Status: Pt diagnosed with moderate malnutrition 04/09/17.     Nutrition Intervention:   1. Continue TFs + flushes as ordered. Recommend to change ProSource to daily. Will still meet needs at 1680 kcal, 83 g protein, 1200 mg phosphorus and 1001 ml free water daily  2. Continue to adjust FWF prn based on fluid status.   3. Continue regular diet. Can d/c magic cup- pt does not like  4. Continue to monitor % po intakes, I/Os and weight at  least weekly- appreciate RN assistance  5. Continue to monitor BMP, Mg and PO4. Recommend to check vitamin D and supplement if low    Nutrition Monitoring/Evaluation:   1. Will monitor diet tolerance and intake, TF tolerance, nutrition-related labs, weight trend, BM pattern, supplement acceptance.   2. Will follow up per high nutrition risk protocol.    Laurence Aly, [place]  Pager [phone]

## 2017-06-03 NOTE — Progress Notes (Signed)
Amanda Galloway is currently functioning below her baseline, however is demonstrating the endurance to tolerate an intense therapy program. Recommend PM&R consult be placed at this time. However, if discharge directly to home is desired, Amanda Galloway will need 24 Hour assistance with all mobility and require Home PT. PT Discharge Equipment Recommended: To be determined if returning to prior living environment today.      Physical Therapy Treatment Note:     06/03/17 1025   Visit Details [facility])   Visit Type [facility]) Follow Up   Reason for visit [place]) [place]   Visit Number   Visit Number [facility]) / Treatment Day (HH) 10   Precautions/Observations   Precautions used Yes   LDA Observation Feeding tube;IV lines  (disconnected)   Fall Precautions General falls precautions   Other Pt supine in bed, agreeable for PT.    Pain Assessment   *Is the patient currently in pain? Yes   Pain (Before,During, After) Therapy Before;During   0-10 Scale 3   Pain Location Abdomen   Pain Intervention(s) Refer to nursing for pain management;Repositioned   Cognition   Arousal/Alertness Appropriate responses to stimuli   Orientation Oriented to person;Disoriented to time;Disoriented to situation   Following Commands Follows simple commands with increased time;Follows simple commands with repetition   Additional Comments Initially appeared lethargic but improved t/o session. Pt educated on focus on heel strike during gait at start of ambulation, at end of ambulation pt able to recall with increased VCs and prompting. Pt required increased repetition of cues for management of RW in smaller areas as well as sequencing when washing hands at sink.   Bed Mobility   Bed mobility Tested   Supine to Sit Contact guard;1 person assist;Verbal cues;Side rails down;Head of bed flat   Sit to Supine Not tested   Additional comments Pt required encouragement to complete without railing. She was able to bring BLE towards edge  of bed to prepare. When completing supine to long sit positioning pt required CGA at trunk during transition of propping onto elbows then to BUE.   Transfers   Transfers Tested   Sit to Stand Moderate ;1 person assist   Stand to sit Contact guard;1 person assist   Transfer Assistive Device rolling walker   Additional comments Pt up from edge of bed, recliner chair, and toilet. From edge of bed, pt required modAx1 with assist at trunk during forward weight shift and transition from UE on bed to RW. From toilet and recliner pt able to complete with minAx1 likely d/t arm rest assisting with transfer. PT continued to requried assist for forward weight shift.   Mobility   Mobility Tested   Gait Pattern Decreased cadence;Foot flat L;Foot flat R;Decreased R step length;Decreased R step height;Decreased L step length;Decreased L step height   Ambulation Assist Contact guard;1 person assist   Ambulation Distance (Feet) 2x31ft   Ambulation Assistive Device rolling walker   Stairs Assistance Minimal;1 person assist   Stair Management Technique One rail;Step to pattern;Forwards   Number of Stairs 2 trials x 4 steps   Additional comments Pt ambulated at slow gait speed and required CGA at trunk for stabiltiy and atnerolateral weight shift. VCs for positoining within RW and heel strike. Pt also requried increased VCs when negoitating smaller areas such as bathroom. PT able to ascned/descend stairs requiring minAx1 d/t decreased eccentric control. Pt experienced 1 mild LOB posteriorly requiring assist to prevent fall.  Balance   Balance Tested   Sitting - Static Independent ;Supported   Standing - Cytogeneticist guard;Unsupported   Standing - Dynamic Min assist;Supported   PT AM-PAC Mobility   Turning over in bed? 1   Sitting down on and standing up from a chair with arms? 1   Moving from lying on back to sitting on the side of the bed? 1   Moving to and from a bed to a chair? 2   Need to walk in hospital room? 3   Climbing 3 - 5  steps with a railing? 3   Total Raw Score 11   Standardized Score 33.86   CMS 1-100% Score 73   Medicare Functional Limit Modifiers CL  60 - < 80% impaired, limited, restricted   Assessment   Brief Assessment Remains appropriate for skilled therapy   Problem List Pain contributing to impairment;Impaired functional mobility   Patient / Family Goal rehab   Additional Comments Pt is noted to have improved tolerance to therapy and alertness during session. She is making progress towards goals, progressing in bed mobility and stairs. She does continue to require 1A for mobility to prevent LOB and assist with stability. Due to improvements in toelrance to activity and progression, pt would benefit from intensive PT in Acute Rehab to maximize function. Pt has supportive family at home.   Plan/Recommendation   Treatment Interventions Restorative PT   PT Frequency 2-4x/wk   Mobility Recommendations 1A with RW. Please assist with ambulation 3x/day   Referral Recommendations PM&R Consult;OT   Discharge Recommendations Acute Rehab   PT Discharge Equipment Recommended To be determined   Assessment/Recommendations Reviewed With: Patient;Nursing;Midlevel provider;Social Worker   Next PT Visit BLE strengthening, progress all mobility   Time Calculation   PT Timed Codes 30   PT Untimed Codes 0   PT Unbilled Time 0   PT Total Treatment 30   Plan and Onset date   Plan of Care Date 05/28/17   Treatment Start Date 04/21/17     Rodena Piety, PT, DPT  Pager [phone]

## 2017-06-04 ENCOUNTER — Other Ambulatory Visit: Payer: Self-pay | Admitting: Cardiology

## 2017-06-04 LAB — EKG 12-LEAD
P: 21 deg
PR: 164 ms
QRS: 12 deg
QRSD: 66 ms
QT: 340 ms
QTc: 435 ms
Rate: 98 {beats}/min
Severity: BORDERLINE
Statement: BORDERLINE
Statement: BORDERLINE
T: 11 deg

## 2017-06-04 LAB — POCT GLUCOSE: Glucose POCT: 90 mg/dL (ref 60–99)

## 2017-06-04 MED ORDER — HYDROMORPHONE HCL 2 MG PO TABS *I*
2.0000 mg | ORAL_TABLET | Freq: Once | ORAL | Status: AC
Start: 2017-06-04 — End: 2017-06-04
  Administered 2017-06-04: 2 mg via ORAL
  Filled 2017-06-04: qty 1

## 2017-06-04 MED ORDER — MIRTAZAPINE 15 MG PO TABS *I*
7.5000 mg | ORAL_TABLET | Freq: Every evening | ORAL | Status: DC
Start: 2017-06-04 — End: 2017-06-05
  Administered 2017-06-04 – 2017-06-05 (×2): 7.5 mg via ORAL
  Filled 2017-06-04 (×2): qty 1

## 2017-06-04 MED ORDER — HYDROMORPHONE HCL 1 MG/ML PO LIQD *I*
2.0000 mg | Freq: Once | ORAL | Status: DC
Start: 2017-06-04 — End: 2017-06-04

## 2017-06-04 NOTE — Progress Notes (Signed)
Occupational Therapy Treatment Note     06/04/17 0907   Visit Details [facility])   Visit Type [facility]) Follow Up   Reason for visit [facility]) General  (General Follow-up SNF vs 512)   OT Tracking   OT Tracking OT Assigned   OT Last Visit   Visit (#) of Five 2   Precautions   Precautions used Yes   Fall Precautions General falls precautions   LDA Observation IV lines;Feeding tube;Other (Comment)  (Port)   Pain Assessment   *Is the patient currently in pain? Yes   Pain Location 1 Abdomen   Pain Scale 1 4   Pain 1 Before;During;After   Pain Intervention(s) 1 Refer to nursing for pain management   Pain Location 2 Back   Orientation of Pain 2 Lower   Pain Scale 2 4   Pain 2 Before;During;After   Pain Intervention(s) 2 Refer to nursing for pain management   Pain Location 3 Shoulder   Orientation of Pain 3 Left;Anterior   Pain Scale 3 4  (increases to 8/10 with ROM)   Pain 3 Before;During;After   Pain Intervention(s) 3 Refer to nursing for pain management   ADL Assessment   Eating Set up   Assist Needed With: Opening containers;Set up   Where Eating Assessed Chair   Grooming Supervision;Set up   Where Grooming Assessed Other (comment)  (sitting on toilet)   Assist Needed With: Increased time to complete;Set up;Other (Comment)  (balance)   Areas Assessed: Washing face;Washing hands  (deferred brushing teeth until after breakfast)   UE Dressing Set up  (doff and don gown)   Where UE Dressing Assessed Other (comment)  (sitting on toilet)   Assist Needed With: Set up;Increased time   LE Dressing Maximum Assist   Where  LE Dressing Assessed Standing;Chair   Assist Needed With: Setup;Decreased balance;Pants over feet;Pants up to waist;Increased time;Socks   Bathing (supervision/setup UB bathing, Mod A LB)   Toileting Contact guard   Where Toileting Assessed Standard toilet   Assist needed with Setup;Steadying   Additional Comments Pt demonstrated ADL completion in BR while sitting on toilet.  She was able to complete toilet transfer with  Min A with RW;  While seated, supervision/setup was provided when completing grooming with setup and UB dressing with setup.  Pt was able to wash UB with supervision/setup and wash LB with Mod A (unable to reach lower legs and feet).  With CGA, she was able to complete hygiene of peri area and buttocks.  Pt fatigues easily and frequent rest breaks needed.   Bed Mobility   Supine to Sit Contact Guard;HOB elevated;Use of bed rail   Sit to Supine Not tested   Additional Comments Pt sitting in chair at end of session   Functional Transfers   Sit to Stand Moderate Assist  (Mod A from bed, Min A from toilet)   Stand to [employer] Transfers Minimal assist   Additional Comments Pt required increased verbal  cues for proper walker use; not keeping both feet inside walker with ambulation.   Balance   Sitting - Static Supervision   Sitting - Dynamic Contact Guard   Standing - Static Contact Guard   Standing - Dynamic Minimal Assist   Cognition   Level of Alertness Lethargic  (but once moving, more alert)   Orientation Oriented to person;Oriented to place;Oriented to situation  (April 1918, [facility])   Attention  Impaired focal   Memory Decreased working memory;Decreased short  term memory   Following Commands One step simple   Awareness Intact   Insight Intact   Initiation Verbal cues to initiate tasks   Additional Comments Initially, when asking pt questions, she would "zone out," keeping eyes open but not answering questions.  I was able to redirect her with verbal cues.  Once up and moving, pt much more alert.   UE Assessment   Additional Comments Pt reported hurting her left shoulder when falling here not too long ago in the bathroom.  Pt able to achieve approx 60 degrees of left shoulder flexion before c/o 8/10 pain.   Other Comments   Comments Pt continues to require assistance with all ADL's and mobility and is limited in many of these areas by fatige and poor endurance/strength, indicating that rehab would  continue to be optimal recommendation   Assessment   Assessment Patient making progress, continue with plan of care   Recommendations   Additional Comments SNF rehab   Multidisciplinary Communication   Multidisciplinary Communication pt, OT, nursing     Timed Calculations:  Timed Codes:  40 minute ADl  Untimed Codes: 0  Unbilled Time: 0  Total Time:  40 minutes    Skylarr Liz OTR/L  [phone]    Please contact the OT pager [facility] [phone]) for all questions/concerns and/or update requests.

## 2017-06-04 NOTE — Progress Notes (Addendum)
Surgical Oncology/Hepatobiliary Surgery Progress Note     LOS: 67 days     Subjective:  Interval Events: NAEO. PO intake decreased from yesterday 250 cc (520). Pt continues to complain of abdominal pain and fullness limiting PO. Dispo plan in progress, hopeful for 512 with DHT.    Objective:  Vitals Sign Ranges for Past 24 Hours:  BP: (110-144)/(60-82)   Temp:  [36.1 C (97 F)-36.4 C (97.5 F)]   Temp src: Temporal (04/02 0336)  Heart Rate:  [94-111]   Resp:  [16]   SpO2:  [91 %-98 %]     Physical Exam:  General Appearance: in NAD, resting in bed comfortably  Cardiac: regular rate  Respiratory: non-labored breathing on room air  Abdomen: Soft, minimally tender to palpation, non distended, incision well healed   Extremities: warm, erythema of the bilateral anterior calves with small amount of skin breakdown, minimal edema no posterior calf tenderness     Labs:    CBC:    Recent Labs  Lab 06/03/17  0518 05/31/17  0622 05/30/17  0111 05/29/17  0019   WBC 10.2* 8.7 9.5 9.8   Hemoglobin 7.6* 6.9* 6.8* 6.9*   Hematocrit 25* 23* 22* 22*   Platelets 609* 540* 551* 301*       Metabolic Panel:    Recent Labs  Lab 06/03/17  0518 05/31/17  0622 05/29/17  0019   Sodium 135 136 134   Potassium 4.7 4.3 4.3   Chloride 96 99 95*   CO2 '24 27 27   ' UN 21* 17 16   Creatinine 0.74 0.61 0.71   Glucose 125* 112* 111*   Calcium 9.3 8.3* 8.5*   Magnesium 1.8 1.6 1.8   Phosphorus 5.8* 4.9* 5.8*          Assessment:  72 y.o. female with h/o recent PE and pancreatic cancer POD # 33  status post whipple procedure complicated by delayed gastric emptying.  NGT replaced on 04/04/17. UGI on 04/12/17 concerning for obstruction just beyond the [place] in the efferent limb.  Course complicated on 08/04/07 by rising LFTs and sepsis with CT concerning for cholangitis given biliary tree obstruction and PV compression due to hematoma and afferent limb distention in setting of PE treatment. Is s/p IR PTC on 04/16/17 and IR percutaneous aspiration of anterior  abdominal fluid collection on 04/24/17. IR LUQ perc drain placement 3/1. IR anterior abdominal 33f perc drain placement 3/3. IR anterior perc drain into left hepatic collection 3/6.    Plan:  - Polysporin and compression stockings to bilateral calves. Continue to monitor.  - Unable to safely place PEG per Thoracic and GI  - Regular diet + ensures  - Continue to encourage PO/enteral nutrition, TFs to goal - now cycled over 12 hours.  - Aggressive bowel regimen  - CT scan 3/21 with intra-abd collections, new portal venous collection. However, patient remains afebrile, clinically stable, WBCs stable. No plans for drain at this time. On diflucan/bactrim, appreciate ID recs. Plan for repeat CT Scan (04/11).  - biliary drain removed 3/26  - SCDs, Lovenox for VTE ppx (hold therapeutic anticoagulation)   - Dispo: Pending clinical course, adequate nutrition/PO intake. Poss dispo to 512.    KBishop Dublin MD  06/04/2017     6:44 AM   General Surgery Resident          HPB-GI Attending Addendum:   I personally examined the patient, reviewed the notes, and discussed the plan of care with the residents  and patient. Agree with detailed resident's note. Please see the note above for details of history, exam, labs, assessment/plan which reflect my input.     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 Abdominal abscess on imaging.     Hyperglycemia with insulin sliding scale and recurrent need for insulin. continue   Post operative anemia. Stable. No need for transfusion  Bilateral pleural effusions. Stable. No oxygen requirement.   Pulmonary embolism. Present on admission. lovenox restarted.  Hypoalbuminemia. Moderate malnutrition. Tube feedings at goal.cycled. Encourage po intake.   inadequate oral intake alone. Requesting PEG tube.   Depression. Encouragement provided. Slightly improved. Ritalin helpful.  Hallucinations.   Multiple intraabdominal abscesses with Enterobacter cloacae complex bacteremia and fungus.    improvement of collections.    Antibiotics for abdominal collections. Transitioned to oral. No fevers.   Constipation . Aggressive regimen.   Physical debility. Requires rehabilitation setting.      Delano Metz, MD, FACS  Hepatobiliary, Pancreas & GI Surgery

## 2017-06-04 NOTE — Plan of Care (Signed)
Bowel Elimination     Elimination patterns are normal or improving Progressing towards goal        Fluid and Electrolyte Imbalance     Fluid and Electrolyte imbalance Progressing towards goal        GI Bleeding Elimination     Elimination of patterns are normal or improving Progressing towards goal        Mobility     Functional status is maintained or improved - Geriatric Progressing towards goal     Patient's functional status is maintained or improved Progressing towards goal        Nutrition     Patient's nutritional status is maintained or improved Progressing towards goal     Nutritional status is maintained or improved - Geriatric Progressing towards goal        Pain/Comfort     Patient's pain or discomfort is manageable Progressing towards goal        Post-Operative Bowel Elimination     Elimination pattern is normal or improving Progressing towards goal        Post-Operative Hemodynamic Stability     Maintain Hemodynamic Stability Progressing towards goal        Psychosocial     Demonstrates ability to cope with illness Progressing towards goal        Safety     Patient will remain free of falls Progressing towards goal

## 2017-06-04 NOTE — Plan of Care (Signed)
Bowel Elimination     Elimination patterns are normal or improving Maintaining        Fluid and Electrolyte Imbalance     Fluid and Electrolyte imbalance Maintaining        GI Bleeding Elimination     Elimination of patterns are normal or improving Maintaining        Mobility     Functional status is maintained or improved - Geriatric Maintaining     Patient's functional status is maintained or improved Maintaining        Nutrition     Patient's nutritional status is maintained or improved Maintaining     Nutritional status is maintained or improved - Geriatric Maintaining        Pain/Comfort     Patient's pain or discomfort is manageable Maintaining        Post-Operative Bowel Elimination     Elimination pattern is normal or improving Maintaining        Post-Operative Hemodynamic Stability     Maintain Hemodynamic Stability Maintaining        Psychosocial     Demonstrates ability to cope with illness Maintaining        Safety     Patient will remain free of falls Maintaining

## 2017-06-04 NOTE — Progress Notes (Signed)
Palliative Care Progress Note    HPI: [name] w/ PMH fibromyalgia, pancreatic CA s/p Whipple for 'cure' 03/29/17; s/p ICU w/ intub for resp failure & septic shock, weaned from pressors and extubated to HFNC 2/17 w/ IR drainage bili fluid collection 2/19, s/p IV abx, now all abd drains for abscesses removed. Palliative following for symptoms, started methadone 2/19, weaned dose 2/23 d/t concern for sedation. Pt w/ ongoing poor po intake, now tolerating bolus fds via dubhoff fdg tube. Unfortunately not able to get PEG w/o going to OR. Goal dispo SNF Rehab.    Subjective: sleeping when visited today    Objective: chart reviewed, pt case discussed w/ covering provider and Dr Ron Agee; pt's dtr asked about trying megace for appetite stim - my NP student provided research and explanation of why megace not good option for appetite stim; reviewed prior attempt w/ remeron which has SE wt gain though takes weeks to reach effect -on closer review of MAR found that pt only received one dose remeron as it was simultaneously started and stopped w/ marinol d/t pt confusion; relayed that marinol more likely contributed to confusion, sought counsel from [place] and med interaction revealing caution to monitor for serotonin sickness though otherwise should be ok - relayed recommendation to covering provider and pt's dtr aware & in agreement    Palliative Care ROS:  Sleeping, did not waken   Last stool 4/1 after suppository     Physical Examination:   BP: (100-156)/(50-90)   Temp:  [35.6 C (96.1 F)-36.8 C (98.2 F)]   Temp src: Temporal (04/02 1622)  Heart Rate:  [94-114]   Resp:  [16]   SpO2:  [91 %-98 %]   Weight:  [78.6 kg (173 lb 4.8 oz)]   Gen appearance: [name], laying in bed, sleeping soundly, appears comfortabl  Lungs: easy resp, RA  Extrem: mod LE edema  Skin: ruddy LEs, dry but less taught  Neuro: sleeping when assessed today. At baseline is A+O x 3 & ambulates short distances w/ walker    Assessment/Plan: 72  yo F w/ pancreatic CA, s/p Whipple in Jan. s/p TPN, s/p perc drains and IV abx w/ resolving abd abscesses. Pt started trial of bolus TFs if she does not eat at least 50% meal. Pt's need for dubhoff is barrier to discharge to SNF Rehab and may likely need LTC regardless. May consider taking pt to OR to place PEG which would move forward her dispo planning and ability to get more intensive rehab. In meantime, plan to start mirtazapine QHS to help w/ wt gain though not anticipated effect for weeks.    Pain/dyspnea  Acetaminophen 1 gm po TID  Methadone 5/7.5/7.5 mg po TID (total 20 mg/d, last increased 3/11)  Dilaudid 2 mg po Q 4 hrs prn (x 1 on 4/1, x 2 on 4/2)  Lidocaine patch daily x 2 (mid back, LUE)  Encourage incentive spirometry prn     Anxiety/agitation/nausea/fatigue  Duloxetine 20 mg po daily  Reglan 10 mg po 4x/d (AC & QHS)  Pantoprazole 40 mg po BID  Calcium carbonate 1 gm po BID prn (last x 1 on 3/25)  Ondansetron 4 mg IV Q 6 hrs prn   Ritalin 2.5 mg po at 8 am and 2.5 mg at 12 pm (reduced dose 3/26) per Phmd unlikely ritalin is cause of mild confusion or appetite suppression  Add mirtazapine 7.5 mg po QHS (caution to monitor for serotonin sickness w/ symptoms of fever, agitation,  tremors; if happens would see soon after drug initiation)    Prevention of constipation, last stool 4/1  Colace 200 mg po BID  Miralax 17 gm po daily  Senna 2 tab po BID  Bisacodyl supp pr daily     Dry flaky edematous Skin  Bacitracin oint BID to BLEs  Eucerin prn for BLEs    GOC/prognosis  Full code  Dtr Rod Mae is HCP  Dispo plans now for SNF Rehab and may likely need LTC for a period in addition   4/1 initiating trial of PO meals first & bolus fds after if not eating at least 50% of meal.  Pt may still need PEG fdg tube placed in OR for feasible discharge plan    Pt case discussed w/ RN, covering provider and Dr Ron Agee.     We will continue to follow along, please call if questions/concerns.    Total Time Spent 45  minutes: >50% of time was spent in counseling and/or coordination of care.     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    Hypothyroidism E03.9    Pancreatic cancer C25.9    Anemia D64.9     hx of Pulmonary embolism I26.99    Depression F32.9    Sepsis A41.9       No Known Allergies (drug, envir, food or latex)    Scheduled Meds:    mirtazapine  7.5 mg Oral Nightly    methadone  5 mg Oral Daily    PROSOURCE NO CARB  30 mL Oral Daily with breakfast    triamterene-hydrochlorothiazide  1 tablet Oral QAM    bacitracin zinc-polymyxin B sulfate   Topical 2 times per day    lidocaine  1 patch Transdermal Q24H    methadone  7.5 mg Oral BID    polyethylene glycol  17 g Oral Daily    sulfamethoxazole-trimethoprim  1 tablet Oral 2 times per day    methylphenidate  2.5 mg Oral BID    fluconazole  800 mg Oral Daily    insulin glargine  6 Units Subcutaneous Nightly    pantoprazole  40 mg Oral BID AC    senna  2 tablet Oral 2 times per day    bisacodyl  10 mg Rectal Daily    enoxaparin  40 mg Subcutaneous Daily    DULoxetine  20 mg Oral Daily    metoclopramide  10 mg Oral 4x Daily AC & HS    docusate sodium  200 mg Oral 2 times per day    acetaminophen  1,000 mg Oral Q8H    levothyroxine  137 mcg Oral Daily       Continuous Infusions:       PRN Meds:  HYDROmorphone, naloxone, sodium chloride, dextrose, calcium carbonate, heparin lock flush, ondansetron

## 2017-06-05 ENCOUNTER — Other Ambulatory Visit: Payer: Self-pay | Admitting: Cardiology

## 2017-06-05 ENCOUNTER — Inpatient Hospital Stay: Payer: Medicare (Managed Care)

## 2017-06-05 ENCOUNTER — Other Ambulatory Visit: Payer: Self-pay

## 2017-06-05 LAB — CBC AND DIFFERENTIAL
Baso # K/uL: 0.1 10*3/uL (ref 0.0–0.1)
Basophil %: 0.7 %
Eos # K/uL: 0.1 10*3/uL (ref 0.0–0.4)
Eosinophil %: 0.8 %
Hematocrit: 24 % — ABNORMAL LOW (ref 34–45)
Hemoglobin: 7.3 g/dL — ABNORMAL LOW (ref 11.2–15.7)
IMM Granulocytes #: 0.1 10*3/uL
IMM Granulocytes: 0.9 %
Lymph # K/uL: 0.8 10*3/uL — ABNORMAL LOW (ref 1.2–3.7)
Lymphocyte %: 7.8 %
MCH: 28 pg/cell (ref 26–32)
MCHC: 31 g/dL — ABNORMAL LOW (ref 32–36)
MCV: 90 fL (ref 79–95)
Mono # K/uL: 0.8 10*3/uL (ref 0.2–0.9)
Monocyte %: 7.5 %
Neut # K/uL: 8.7 10*3/uL — ABNORMAL HIGH (ref 1.6–6.1)
Nucl RBC # K/uL: 0 10*3/uL (ref 0.0–0.0)
Nucl RBC %: 0 /100 WBC (ref 0.0–0.2)
Platelets: 573 10*3/uL — ABNORMAL HIGH (ref 160–370)
RBC: 2.6 MIL/uL — ABNORMAL LOW (ref 3.9–5.2)
RDW: 17.2 % — ABNORMAL HIGH (ref 11.7–14.4)
Seg Neut %: 82.3 %
WBC: 10.5 10*3/uL — ABNORMAL HIGH (ref 4.0–10.0)

## 2017-06-05 LAB — VITAMIN D
25-OH VIT D2: <4 ng/mL
25-OH VIT D3: 37 ng/mL
25-OH Vit Total: 37 ng/mL (ref 30–60)

## 2017-06-05 LAB — LACTATE, PLASMA: Lactate: 2.3 mmol/L — ABNORMAL HIGH (ref 0.5–2.2)

## 2017-06-05 LAB — POCT GLUCOSE: Glucose POCT: 150 mg/dL — ABNORMAL HIGH (ref 60–99)

## 2017-06-05 LAB — CK: CK: 76 U/L (ref 26–192)

## 2017-06-05 MED ORDER — STERILE WATER FOR IRRIGATION IR SOLN *I*
900.0000 mL | Freq: Once | Status: AC
Start: 2017-06-05 — End: 2017-06-05
  Administered 2017-06-05: 900 mL via ORAL

## 2017-06-05 MED ORDER — IBUPROFEN 200 MG PO TABS *I*
600.0000 mg | ORAL_TABLET | Freq: Four times a day (QID) | ORAL | Status: DC | PRN
Start: 2017-06-05 — End: 2017-06-19

## 2017-06-05 MED ORDER — INSULIN GLARGINE 100 UNIT/ML SC SOLN *WRAPPED*
4.0000 [IU] | Freq: Every evening | SUBCUTANEOUS | Status: DC
Start: 2017-06-05 — End: 2017-06-14
  Administered 2017-06-05 – 2017-06-13 (×9): 4 [IU] via SUBCUTANEOUS

## 2017-06-05 MED ORDER — LIDOCAINE 5 % EX PTCH *I*
1.0000 | MEDICATED_PATCH | CUTANEOUS | Status: AC
Start: 2017-06-05 — End: 2017-08-05
  Administered 2017-06-05 – 2017-08-04 (×59): 1 via TRANSDERMAL
  Filled 2017-06-05 (×59): qty 1

## 2017-06-05 NOTE — Progress Notes (Addendum)
Surgical Oncology/Hepatobiliary Surgery Progress Note     LOS: 68 days     Subjective:  Interval Events: NAE Overnight. PO intake remains marginal 520cc (250); abdominal pain and fullness continue to limit PO intake. Added Mirtazapine yesterday per Palliative recommendations. Pt has physical debilitation that requires a rehab setting.    Objective:  Vitals Sign Ranges for Past 24 Hours:  BP: (100-156)/(50-90)   Temp:  [35.6 C (96.1 F)-36.8 C (98.2 F)]   Temp src: Temporal (04/03 0441)  Heart Rate:  [97-114]   Resp:  [16]   SpO2:  [92 %-98 %]   Weight:  [78.6 kg (173 lb 4.8 oz)]     Physical Exam:  General Appearance: in NAD, resting in bed comfortably  Cardiac: regular rate  Respiratory: non-labored breathing on room air  Abdomen: Soft, minimally tender to palpation, non distended, incision well healed   Extremities: warm, erythema of the bilateral anterior calves with small amount of skin breakdown, minimal edema no posterior calf tenderness     Labs:    CBC:    Recent Labs  Lab 06/03/17  0518 05/31/17  0622 05/30/17  0111   WBC 10.2* 8.7 9.5   Hemoglobin 7.6* 6.9* 6.8*   Hematocrit 25* 23* 22*   Platelets 609* 540* 347*       Metabolic Panel:    Recent Labs  Lab 06/03/17  0518 05/31/17  0622   Sodium 135 136   Potassium 4.7 4.3   Chloride 96 99   CO2 24 27   UN 21* 17   Creatinine 0.74 0.61   Glucose 125* 112*   Calcium 9.3 8.3*   Magnesium 1.8 1.6   Phosphorus 5.8* 4.9*          Assessment:  72 y.o. female with h/o recent PE and pancreatic cancer POD # 62  status post whipple procedure complicated by delayed gastric emptying.  NGT replaced on 04/04/17. UGI on 04/12/17 concerning for obstruction just beyond the [place] in the efferent limb.  Course complicated on 06/27/93 by rising LFTs and sepsis with CT concerning for cholangitis given biliary tree obstruction and PV compression due to hematoma and afferent limb distention in setting of PE treatment. Is s/p IR PTC on 04/16/17 and IR percutaneous aspiration of anterior  abdominal fluid collection on 04/24/17. IR LUQ perc drain placement 3/1. IR anterior abdominal 46f perc drain placement 3/3. IR anterior perc drain into left hepatic collection 3/6.    Plan:  - Polysporin and compression stockings to bilateral calves. Continue to monitor.  - Unable to safely place PEG per Thoracic and GI  - Regular diet + ensures  - Continue to encourage PO/enteral nutrition, TFs to goal - now cycled over 12 hours.  - Aggressive bowel regimen  - CT scan 3/21 with intra-abd collections, new portal venous collection. However, patient remains afebrile, clinically stable, WBCs stable. No plans for drain at this time. On diflucan/bactrim, appreciate ID recs. Plan for repeat CT Scan (04/11).  - Biliary drain removed 3/26  - SCDs, Lovenox for VTE ppx (hold therapeutic anticoagulation)   - Dispo: Pending clinical course, adequate nutrition/PO intake. Pt has Physical Debilitation requiring a rehab setting. PT/OT recommending SNF.    KBishop Dublin MD  06/05/2017     5:54 AM   General Surgery Resident      HPB-GI Attending Addendum:   I personally examined the patient, reviewed the notes, and discussed the plan of care with the residents and patient. Agree with  detailed resident's note. Please see the note above for details of history, exam, labs, assessment/plan which reflect my input.      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 Abdominal abscess on imaging.      Hyperglycemia with insulin sliding scale and recurrent need for insulin. continue   Post operative anemia. Stable. No need for transfusion  Bilateral pleural effusions. Stable. No oxygen requirement.   Pulmonary embolism. Present on admission. lovenox restarted.  Hypoalbuminemia. Moderate malnutrition. Tube feedings at goal.cycled. Encourage po intake.   inadequate oral intake alone. Requesting PEG tube.   Depression. Encouragement  provided. Slightly improved. Ritalin helpful. Hallucinations.   Multiple intraabdominal abscesses with Enterobacter cloacae complex bacteremia and fungus.    improvement of collections.    Antibiotics for abdominal collections. Transitioned to oral. No fevers.   Constipation . Aggressive regimen.   Physical debility. Requires rehabilitation setting.      Delano Metz, MD, FACS  Hepatobiliary, Pancreas & GI Surgery

## 2017-06-05 NOTE — Progress Notes (Signed)
Report Given   Diagnostic Imaging Nurse Handoff:    Study: CT scan  A&O: x1-2  Telemetry: no  IV Access: port  Precautions: none  Transport: bed on hover  Hover Requested: yes  Contrast (Oral/NG): oral  Respiratory: room air  Report Received From: bedside RN

## 2017-06-05 NOTE — Progress Notes (Signed)
Palliative Care Progress Note    HPI: 73 yo F w/ PMH fibromyalgia, pancreatic CA s/p Whipple for 'cure' 03/29/17; s/p ICU w/ intub for resp failure & septic shock, weaned from pressors and extubated to HFNC 2/17 w/ IR drainage bili fluid collection 2/19, s/p IV abx, now all abd drains for abscesses removed. Palliative following for symptoms, started methadone 2/19, weaned dose 2/23 d/t concern for sedation. Pt w/ ongoing poor po intake, now tolerating bolus fds via dubhoff fdg tube. Unfortunately not able to get PEG w/o going to OR. Goal dispo SNF Rehab.    Subjective: "I'm ok, no pain. I ate better this morning"    Objective: chart reviewed, pt reports she slept better last noc after starting remeron; pt was  sitting up in chair, getting ready to go for walk around unit w/ walker and 2 standby assist; pt case discussed w/ RN who reports pt ate eggs and bacon for breakfast but needed supplemental TF bolus for poor po at lunch; pt in good spirits, tired and needing encouragement to get up to ambulate    Palliative Care ROS:  Pain mild  Nausea no  Dyspnea no  Weakness yes  Constipation no  Delirum no  Last stool 4/3     Physical Examination:   BP: (106-156)/(62-90)   Temp:  [35.8 C (96.4 F)-36.8 C (98.2 F)]   Temp src: Temporal (04/03 1208)  Heart Rate:  [97-114]   Resp:  [16-18]   SpO2:  [92 %-95 %]   Weight:  [78.6 kg (173 lb 4.8 oz)]   Gen appearance: [name], sitting up in recliner, no acute symptoms, needing encouragement to get up and ambulate  Lungs: easy resp, RA  Extrem: mod LE edema, wearing TED stockings  Skin: warm, dry  Neuro:  A+O x 3 & ambulates short distances w/ walker & standby assist    Assessment/Plan: [name] w/ pancreatic CA, s/p Whipple in Jan. s/p TPN, s/p perc drains and IV abx w/ resolving abd abscesses. Pt started trial of bolus TFs if she does not eat at least 50% meal. Pt's need for dubhoff is barrier to discharge to SNF Rehab and may likely need LTC regardless. May  consider taking pt to OR to place PEG which would move forward her dispo planning and ability to get more intensive rehab. Started mirtazapine QHS yesterday to help w/ wt gain though not anticipated effect for weeks. Ate well for breakfast though need bolus fds supplement after poor po at lunch.    Pain/dyspnea  Acetaminophen 1 gm po TID  Methadone 5/7.5/7.5 mg po TID (total 20 mg/d, last increased 3/11)  Dilaudid 2 mg po Q 4 hrs prn (x 2 on 4/2, x 2 on 4/3)  Lidocaine patch daily x 2 (mid back, LUE)  Encourage incentive spirometry prn     Anxiety/agitation/nausea/fatigue  Duloxetine 20 mg po daily  Reglan 10 mg po 4x/d (AC & QHS)  Pantoprazole 40 mg po BID  Calcium carbonate 1 gm po BID prn (last x 1 on 3/25)  Ondansetron 4 mg IV Q 6 hrs prn   Ritalin 2.5 mg po at 8 am and 2.5 mg at 12 pm (reduced dose 3/26) per Phmd unlikely ritalin is cause of mild confusion or appetite suppression  Mirtazapine 7.5 mg po QHS (started 4/2)    Prevention of constipation, last stool 4/3  Colace 200 mg po BID  Miralax 17 gm po daily  Senna 2 tab po BID  Bisacodyl  supp pr daily     Dry flaky edematous Skin  Bacitracin oint BID to BLEs  Eucerin prn for BLEs    GOC/prognosis  Full code  Dtr Rod Mae is HCP  Dispo plans now for SNF Rehab and may likely need LTC for a period in addition   4/1 initiating trial of PO meals first & bolus fds after if not eating at least 50% of meal.  Pt may still need PEG fdg tube placed in OR for feasible discharge plan    Pt case discussed w/ RN.    We will continue to follow along, please call if questions/concerns.    Total Time Spent 35 minutes: >50% of time was spent in counseling and/or coordination of care.     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    Malignant neoplasm of head of pancreas C25.0    Pancreatic adenocarcinoma s/p  Whipple 03/29/17 C25.9    Hypothyroidism E03.9    Pancreatic cancer C25.9    Anemia D64.9     hx of Pulmonary embolism I26.99    Depression F32.9    Sepsis A41.9       No Known Allergies (drug, envir, food or latex)    Scheduled Meds:    insulin glargine  4 Units Subcutaneous Nightly    mirtazapine  7.5 mg Oral Nightly    methadone  5 mg Oral Daily    PROSOURCE NO CARB  30 mL Oral Daily with breakfast    triamterene-hydrochlorothiazide  1 tablet Oral QAM    bacitracin zinc-polymyxin B sulfate   Topical 2 times per day    lidocaine  1 patch Transdermal Q24H    methadone  7.5 mg Oral BID    polyethylene glycol  17 g Oral Daily    sulfamethoxazole-trimethoprim  1 tablet Oral 2 times per day    methylphenidate  2.5 mg Oral BID    fluconazole  800 mg Oral Daily    pantoprazole  40 mg Oral BID AC    senna  2 tablet Oral 2 times per day    bisacodyl  10 mg Rectal Daily    enoxaparin  40 mg Subcutaneous Daily    DULoxetine  20 mg Oral Daily    metoclopramide  10 mg Oral 4x Daily AC & HS    docusate sodium  200 mg Oral 2 times per day    acetaminophen  1,000 mg Oral Q8H    levothyroxine  137 mcg Oral Daily       Continuous Infusions:       PRN Meds:  ibuprofen, HYDROmorphone, naloxone, sodium chloride, dextrose, calcium carbonate, heparin lock flush, ondansetron

## 2017-06-05 NOTE — Plan of Care (Signed)
Problem: Safety  Goal: Patient will remain free of falls  Outcome: Maintaining      Problem: Pain/Comfort  Goal: Patient's pain or discomfort is manageable  Outcome: Maintaining      Problem: Mobility  Goal: Functional status is maintained or improved - Geriatric  Outcome: Maintaining    Goal: Patient's functional status is maintained or improved  Outcome: Maintaining      Problem: Nutrition  Goal: Patient's nutritional status is maintained or improved  Outcome: Maintaining    Goal: Nutritional status is maintained or improved - Geriatric  Outcome: Maintaining      Problem: Bowel Elimination  Goal: Elimination patterns are normal or improving  Outcome: Maintaining      Problem: Post-Operative Hemodynamic Stability  Goal: Maintain Hemodynamic Stability  Outcome: Maintaining      Problem: Psychosocial  Goal: Demonstrates ability to cope with illness  Outcome: Maintaining      Problem: GI Bleeding Elimination  Goal: Elimination of patterns are normal or improving  Outcome: Maintaining

## 2017-06-05 NOTE — Plan of Care (Signed)
Bowel Elimination     Elimination patterns are normal or improving Maintaining        Fluid and Electrolyte Imbalance     Fluid and Electrolyte imbalance Maintaining        GI Bleeding Elimination     Elimination of patterns are normal or improving Maintaining        Nutrition     Patient's nutritional status is maintained or improved Maintaining     Nutritional status is maintained or improved - Geriatric Maintaining        Pain/Comfort     Patient's pain or discomfort is manageable Maintaining        Post-Operative Bowel Elimination     Elimination pattern is normal or improving Maintaining        Post-Operative Hemodynamic Stability     Maintain Hemodynamic Stability Maintaining        Psychosocial     Demonstrates ability to cope with illness Maintaining        Safety     Patient will remain free of falls Maintaining          Mobility     Functional status is maintained or improved - Geriatric Progressing towards goal     Patient's functional status is maintained or improved Progressing towards goal

## 2017-06-06 ENCOUNTER — Other Ambulatory Visit: Payer: Self-pay | Admitting: Cardiology

## 2017-06-06 ENCOUNTER — Inpatient Hospital Stay: Payer: Medicare (Managed Care)

## 2017-06-06 DIAGNOSIS — R7881 Bacteremia: Secondary | ICD-10-CM

## 2017-06-06 LAB — CBC AND DIFFERENTIAL
Baso # K/uL: 0.1 10*3/uL (ref 0.0–0.1)
Basophil %: 0.7 %
Eos # K/uL: 0 10*3/uL (ref 0.0–0.4)
Eosinophil %: 0.4 %
Hematocrit: 22 % — ABNORMAL LOW (ref 34–45)
Hemoglobin: 6.9 g/dL — ABNORMAL LOW (ref 11.2–15.7)
IMM Granulocytes #: 0.1 10*3/uL
IMM Granulocytes: 0.8 %
Lymph # K/uL: 0.9 10*3/uL — ABNORMAL LOW (ref 1.2–3.7)
Lymphocyte %: 8.9 %
MCH: 28 pg/cell (ref 26–32)
MCHC: 32 g/dL (ref 32–36)
MCV: 90 fL (ref 79–95)
Mono # K/uL: 0.7 10*3/uL (ref 0.2–0.9)
Monocyte %: 7 %
Neut # K/uL: 7.8 10*3/uL — ABNORMAL HIGH (ref 1.6–6.1)
Nucl RBC # K/uL: 0 10*3/uL (ref 0.0–0.0)
Nucl RBC %: 0.1 /100 WBC (ref 0.0–0.2)
Platelets: 542 10*3/uL — ABNORMAL HIGH (ref 160–370)
RBC: 2.4 MIL/uL — ABNORMAL LOW (ref 3.9–5.2)
RDW: 17.2 % — ABNORMAL HIGH (ref 11.7–14.4)
Seg Neut %: 82.2 %
WBC: 9.5 10*3/uL (ref 4.0–10.0)

## 2017-06-06 LAB — EKG 12-LEAD
P: 19 deg
P: 57 deg
PR: 172 ms
PR: 164 ms
QRS: 10 deg
QRS: 40 deg
QRSD: 74 ms
QRSD: 80 ms
QT: 284 ms
QT: 292 ms
QTc: 397 ms
QTc: 413 ms
Rate: 117 {beats}/min
Rate: 120 {beats}/min
Severity: ABNORMAL
Severity: ABNORMAL
Statement: ABNORMAL
Statement: BORDERLINE
Statement: ABNORMAL
T: 234 deg
T: 71 deg

## 2017-06-06 LAB — COMPREHENSIVE METABOLIC PANEL
ALT: 49 U/L — ABNORMAL HIGH (ref 0–35)
ALT: 47 U/L — ABNORMAL HIGH (ref 0–35)
AST: 68 U/L — ABNORMAL HIGH (ref 0–35)
AST: 69 U/L — ABNORMAL HIGH (ref 0–35)
Albumin: 2.6 g/dL — ABNORMAL LOW (ref 3.5–5.2)
Albumin: 2.4 g/dL — ABNORMAL LOW (ref 3.5–5.2)
Alk Phos: 535 U/L — ABNORMAL HIGH (ref 35–105)
Alk Phos: 489 U/L — ABNORMAL HIGH (ref 35–105)
Anion Gap: 16 (ref 7–16)
Anion Gap: 12 (ref 7–16)
Bilirubin,Total: <0.2 mg/dL (ref 0.0–1.2)
Bilirubin,Total: <0.2 mg/dL (ref 0.0–1.2)
CO2: 23 mmol/L (ref 20–28)
CO2: 25 mmol/L (ref 20–28)
Calcium: 8.8 mg/dL (ref 8.6–10.2)
Calcium: 8.8 mg/dL (ref 8.6–10.2)
Chloride: 92 mmol/L — ABNORMAL LOW (ref 96–108)
Chloride: 89 mmol/L — ABNORMAL LOW (ref 96–108)
Creatinine: 0.85 mg/dL (ref 0.51–0.95)
Creatinine: 0.82 mg/dL (ref 0.51–0.95)
GFR,Black: 80 *
GFR,Black: 83 *
GFR,Caucasian: 69 *
GFR,Caucasian: 72 *
Glucose: 112 mg/dL — ABNORMAL HIGH (ref 60–99)
Glucose: 98 mg/dL (ref 60–99)
Lab: 21 mg/dL — ABNORMAL HIGH (ref 6–20)
Lab: 22 mg/dL — ABNORMAL HIGH (ref 6–20)
Potassium: 4.8 mmol/L (ref 3.3–5.1)
Potassium: 4.7 mmol/L (ref 3.3–5.1)
Sodium: 131 mmol/L — ABNORMAL LOW (ref 133–145)
Sodium: 126 mmol/L — ABNORMAL LOW (ref 133–145)
Total Protein: 7.7 g/dL (ref 6.3–7.7)
Total Protein: 6.8 g/dL (ref 6.3–7.7)

## 2017-06-06 LAB — POCT GLUCOSE: Glucose POCT: 82 mg/dL (ref 60–99)

## 2017-06-06 LAB — MAGNESIUM: Magnesium: 1.7 mg/dL (ref 1.6–2.5)

## 2017-06-06 LAB — CK: CK: 93 U/L (ref 26–192)

## 2017-06-06 LAB — PHOSPHORUS: Phosphorus: 4.9 mg/dL — ABNORMAL HIGH (ref 2.7–4.5)

## 2017-06-06 LAB — LACTATE, PLASMA: Lactate: 2.2 mmol/L (ref 0.5–2.2)

## 2017-06-06 MED ORDER — IOHEXOL 350 MG/ML (OMNIPAQUE) IV SOLN *I*
1.0000 mL | Freq: Once | INTRAVENOUS | Status: AC
Start: 2017-06-06 — End: 2017-06-06
  Administered 2017-06-06: 117 mL via INTRAVENOUS

## 2017-06-06 MED ORDER — LORAZEPAM 2 MG/ML IJ SOLN *I*
0.5000 mg | Freq: Once | INTRAMUSCULAR | Status: AC
Start: 2017-06-06 — End: 2017-06-07

## 2017-06-06 MED ORDER — MAGNESIUM SULFATE 2 GM IN 50 ML *WRAPPED*
2000.0000 mg | Freq: Once | INTRAVENOUS | Status: AC
Start: 2017-06-06 — End: 2017-06-06
  Administered 2017-06-06: 2000 mg via INTRAVENOUS
  Filled 2017-06-06: qty 50

## 2017-06-06 MED ORDER — SODIUM CHLORIDE 0.9 % IV SOLN WRAPPED *I*
8.0000 mg/kg | INTRAVENOUS | Status: AC
Start: 2017-06-06 — End: 2017-06-06
  Administered 2017-06-06: 500 mg via INTRAVENOUS
  Filled 2017-06-06: qty 10

## 2017-06-06 MED ORDER — ERTAPENEM IN NS 1 GM *I*
1000.0000 mg | INTRAVENOUS | Status: DC
Start: 2017-06-06 — End: 2017-06-14
  Administered 2017-06-06 – 2017-06-14 (×9): 1000 mg via INTRAVENOUS
  Filled 2017-06-06 (×10): qty 50

## 2017-06-06 MED ORDER — SODIUM CHLORIDE 0.9 % IV SOLN WRAPPED *I*
8.0000 mg/kg | INTRAVENOUS | Status: AC
Start: 2017-06-07 — End: 2017-06-08
  Administered 2017-06-07 – 2017-06-08 (×2): 500 mg via INTRAVENOUS
  Filled 2017-06-06 (×2): qty 10

## 2017-06-06 MED ORDER — SODIUM CHLORIDE 0.9 % IV SOLN WRAPPED *I*
100.0000 mL/h | Status: DC
Start: 2017-06-06 — End: 2017-06-17
  Administered 2017-06-06 (×2): 100 mL/h via INTRAVENOUS
  Administered 2017-06-06 – 2017-06-07 (×11): 100 mL/h
  Administered 2017-06-07: 100 mL/h via INTRAVENOUS
  Administered 2017-06-07 (×5): 100 mL/h
  Administered 2017-06-07: 100 mL/h via INTRAVENOUS
  Administered 2017-06-07 – 2017-06-08 (×17): 100 mL/h
  Administered 2017-06-08: 100 mL/h via INTRAVENOUS
  Administered 2017-06-08 (×10): 100 mL/h
  Administered 2017-06-08: 100 mL/h via INTRAVENOUS
  Administered 2017-06-08 – 2017-06-09 (×2): 100 mL/h
  Administered 2017-06-09: 20 mL/h
  Administered 2017-06-09 (×3): 100 mL/h
  Administered 2017-06-09: 20 mL/h
  Administered 2017-06-09 (×3): 100 mL/h
  Administered 2017-06-09 (×2): 100 mL/h via INTRAVENOUS
  Administered 2017-06-09 – 2017-06-10 (×10): 100 mL/h
  Administered 2017-06-10: 20 mL/h
  Administered 2017-06-10: 100 mL/h via INTRAVENOUS
  Administered 2017-06-10 (×2): 100 mL/h
  Administered 2017-06-10: 100 mL/h via INTRAVENOUS
  Administered 2017-06-10 (×2): 100 mL/h
  Administered 2017-06-10: 20 mL/h
  Administered 2017-06-10 (×4): 100 mL/h
  Administered 2017-06-10: 100 mL/h via INTRAVENOUS
  Administered 2017-06-10 – 2017-06-11 (×9): 100 mL/h
  Administered 2017-06-11 (×2): 100 mL/h via INTRAVENOUS
  Administered 2017-06-11 (×3): 100 mL/h
  Administered 2017-06-11: 100 mL/h via INTRAVENOUS
  Administered 2017-06-11 – 2017-06-12 (×4): 100 mL/h
  Administered 2017-06-12: 100 mL/h via INTRAVENOUS
  Administered 2017-06-12 (×3): 100 mL/h
  Administered 2017-06-12: 100 mL/h via INTRAVENOUS
  Administered 2017-06-12 (×13): 100 mL/h
  Administered 2017-06-12: 100 mL/h via INTRAVENOUS
  Administered 2017-06-12 – 2017-06-13 (×10): 100 mL/h
  Administered 2017-06-13: 100 mL/h via INTRAVENOUS
  Administered 2017-06-13 (×3): 100 mL/h
  Administered 2017-06-13: 100 mL/h via INTRAVENOUS
  Administered 2017-06-13 – 2017-06-14 (×8): 100 mL/h
  Administered 2017-06-14: 100 mL/h via INTRAVENOUS
  Administered 2017-06-14 (×6): 100 mL/h
  Administered 2017-06-14 (×2): 100 mL/h via INTRAVENOUS
  Administered 2017-06-14 – 2017-06-15 (×20): 100 mL/h
  Administered 2017-06-15: 100 mL/h via INTRAVENOUS
  Administered 2017-06-15 (×8): 100 mL/h
  Administered 2017-06-15: 100 mL/h via INTRAVENOUS
  Administered 2017-06-15 (×8): 100 mL/h
  Administered 2017-06-16: 100 mL/h via INTRAVENOUS
  Administered 2017-06-16: 20 mL/h
  Administered 2017-06-16 (×7): 100 mL/h
  Administered 2017-06-16: 100 mL/h via INTRAVENOUS
  Administered 2017-06-16 (×16): 100 mL/h
  Administered 2017-06-17: 100 mL/h via INTRAVENOUS
  Administered 2017-06-17 (×4): 100 mL/h

## 2017-06-06 MED ORDER — SODIUM CHLORIDE 0.9 % IV SOLN WRAPPED *I*
6.0000 mg/kg | INTRAVENOUS | Status: DC
Start: 2017-06-06 — End: 2017-06-06

## 2017-06-06 MED ORDER — HYDROMORPHONE HCL 2 MG/ML IJ SOLN *WRAPPED*
0.5000 mg | Freq: Once | INTRAMUSCULAR | Status: AC
Start: 2017-06-06 — End: 2017-06-07

## 2017-06-06 NOTE — Progress Notes (Addendum)
ID Progress Note    Interval: Interim events noted since 05/15/17 when ID team 1 had last signed-off. Please refer to prior ID note from 06/03/17 for details. The abdominal and biliary drains were taken off on 3/17, 3/19, 3/25 and 3/26. Plan by ID was for repeat CT A/P on 06/14/17 and to continue oral fluconazole 12 mg/kg (round up to 800 mg daily) and Bactrim DS 1 tablet every 12 hours daily, duration TBD pending re-imaging of abdomen    ID recalled today for antibiotic recommendations for rigors/lethargy/tachycardia that patient had been having on 06/05/17. Has been afebrile, and no leukocytosis. A CT A/P was done on 06/06/17 which showed, compared to CT from 05/23/17, showed interval development of a new loculated 3 cm fluid collection in the right hepatic lobe, while the hepatic fluid collections remain stable in size. CT chest on 06/06/17 showed no significant change in the moderate left pleural effusion.     She has been on bactrim for the Enterobacter since 05/29/17 and fluconazole per the ID recommendations. No adequate oral coverage for the VRE was found; no VRE coverage was present since 05/30/17      Subjective:    Feels about the same as she always does. Did smile to me. Abdominal pain is the same, not very severe. Denied n/v/chills/rigors/night sweats    Objective:    Temp:  [36.2 C (97.2 F)-37.6 C (99.6 F)] 36.9 C (98.4 F)  Heart Rate:  [98-122] 107  Resp:  [16-18] 18  BP: (106-150)/(60-82) 139/72    PHYSICAL EXAM  General: Sitting in chair, looked little uncomfortable but NADm also smiled today,cooperative  Lines:, Right chest Mediport de-accessed, PIVs  HEENT: Atraumatic head, EOMI  Cardiovascular: Regular rate and rhythm, no murmurs  Lungs: Breathing well on room air  Abdomen: Soft, no drains in place  Neurologic: grossly normal motor/sensory  Extremities: No cyanosis or edema bilaterally      Labs:      Recent Labs  Lab 06/06/17  0114 06/05/17  2305 06/03/17  0518   WBC 9.5 10.5* 10.2*   Hemoglobin 6.9*  7.3* 7.6*   Hematocrit 22* 24* 25*   Platelets 542* 573* 609*   Seg Neut % 82.2 82.3 72.6       Recent Labs  Lab 06/06/17  0114 06/05/17  2305 06/03/17  0518   Sodium 126* 131* 135   Potassium 4.7 4.8 4.7   Chloride 89* 92* 96   CO2 '25 23 24   ' UN 22* 21* 21*   Creatinine 0.82 0.85 0.74   Glucose 98 112* 125*   Calcium 8.8 8.8 9.3       Recent Labs  Lab 06/06/17  0114 06/05/17  2305 06/03/17  0518   Albumin 2.4* 2.6* 2.7*   Total Protein 6.8 7.7 7.3   ALT 47* 49* 45*   AST 69* 68* 62*   Alk Phos 489* 535* 526*     No results for input(s): INR in the last 72 hours.      Recent Labs  Lab 05/31/17  0622   Sedimentation Rate 30   CRP 174*        Recent Labs  Lab 06/05/17  2305   Lactate 2.3*        Imaging:  Chest Single Frontal View    Result Date: 06/06/2017  Stable moderate left pleural effusion with left basilar airspace opacity, most likely atelectasis. Atelectasis in the right lung base. END OF IMPRESSION I have personally reviewed the  images and the Resident's/Fellow's interpretation and agree with or edited the findings. [facility] submits this DICOM format image data and final report to the [facility], an independent secure electronic health information exchange, on a reciprocally searchable basis (with patient authorization) for a minimum of 12 months after exam date.          Microbiology:  1/25: Intraoperative bile: GS 0 pmns, no organisms, Cx: no growth  2/12: 1 set of blood cultures from the right Mediport (no peripheral bld was sent): positive for Enterobacter cloacae complex in 70 hrs, sensitive to zosyn.   2/18 abdominal GS had >25 PMNs, many GPC in pairs/chains, many GNB and cultures 4+ Enterobacter cloacae complex. The cultures also grew 1+ Candida glabrata   2/21: BCx from the Right peripheral IV, mediport, L IJ:  No growth  05/03/17: IR-guided I&D with drain placement of the left upper quadrant collection: GS > 25 PMNs, many GNB and GN diplococci, Cultures with 4+ Enterobacter cloacae  complex(resistant to Zosyn and CTX), C. Albicans and C. Glabrata, and Candida tropicalis  05/04/17:   1 set of blood cultures from left IJ: VRE at 15.3 TTP. Daptomycin sensitivities: MIC 55mg/ml (dose dependent),  Linezolid  1 set from periphery: VRE and Enterobacter cloacae at 14.8 TTP   05/05/17: IR-guided I&D with drain placement of the intrahepatic collection: GS 1-10 PMNs, very few GPC in pairs. Fungal cultures with Candida glabrata  05/05/17: Blood cultures from periphery, Mediport and IJ: no growth  05/04/17: Urine cultures obtained (for unclear reason): no growth  05/08/17: Abscess GS: >25 PMNs, many GPC in chains, few GNB. Aerobic cultures growing 2+ Enterobacter (ertapenem, Cipro, TMP/SMX-sensitive) and 3+VRE, Fungal: C. Glabrata (sens to caspo, vori and dose dependent sens to fluconazole)  06/06/17: 2 sets of BCx (1 set each from periphery and mediport) - taken after restarting ertapenem      Assessment:  New liver abscess and stable prior abdominal abscesses. Afebrile and hemodynamically stable.         Recommendations:  - Agree with restarting of the ertapenem 1gm daily and daptomycin at 857mkg as before. Continue fluconazole 80088maily.   - Please send the abscess for cultures once goes for IR drainage. Same for the pleural fluid.     ID will follow       Seen and discussed with Infectious Disease Attending, Dr GraEdgar Frisk   Page with Qs.      SanRosina LowensteinD  Infectious Diseases fellow, PGY-4  Pager #[phone] ID ATTENDING PROGRESS ADDENDUM  I have seen and evaluated SylLyan Holck4/19, reviewed recent cultures, lab and other pertinent results, and discussed with the ID team.  I concur with ID Fellow/Residents's findings and plan as above.  Intra-abdominal and hepatic abscesses/collections s/p Whipple, all drains had been removed and was transitioned to PO fluconazole (for C glabrata), bactrim (for E cloacae).  Deteriorated with lethargy, tachycardia, diaphoresis 06/05/17, CT A/P showing new 3 cm hepatic  collection.  Restarted on daptomycin and ertapenem.  Previously 05/04/17 BC grew VRE and E cloacae as did drainage on 05/08/17.  Awaiting repeat BCs.    Author:  PauSammuel HinesraEdgar FriskD 06/07/2017 9:42 AM

## 2017-06-06 NOTE — Progress Notes (Addendum)
Surgical Oncology/Hepatobiliary Surgery Progress Note     LOS: 69 days     Subjective:  Interval Events: Pt w/ altered mental status, tachycardia, diaphoresis overnight. Obtained CT Abdomen/Pelvis shows previously-demonstrated pleural effusion and new abdominal collections surrounding the Liver. D/c'd Remeron. Hyponatremia also noted this AM.    PO intake remains marginal 580cc (520), with abdominal pain and fullness continue to limit PO intake.     Objective:  Vitals Sign Ranges for Past 24 Hours:  BP: (106-150)/(60-82)   Temp:  [36.2 C (97.2 F)-37.6 C (99.6 F)]   Temp src: Oral (04/04 0407)  Heart Rate:  [98-122]   Resp:  [16-18]   SpO2:  [92 %-98 %]     Physical Exam:  General Appearance: appears uncomfortable, diaphoretic.  Cardiac: regular rate  Respiratory: non-labored breathing on room air  Abdomen: Soft, minimally tender to palpation, non distended, incision well healed   Extremities: warm, erythema of the bilateral anterior calves with small amount of skin breakdown, minimal edema no posterior calf tenderness     Labs:    CBC:    Recent Labs  Lab 06/06/17  0114 06/05/17  2305 06/03/17  0518 05/31/17  0622   WBC 9.5 10.5* 10.2* 8.7   Hemoglobin 6.9* 7.3* 7.6* 6.9*   Hematocrit 22* 24* 25* 23*   Platelets 542* 573* 609* 785*       Metabolic Panel:    Recent Labs  Lab 06/06/17  0114 06/05/17  2305 06/03/17  0518 05/31/17  0622   Sodium 126* 131* 135 136   Potassium 4.7 4.8 4.7 4.3   Chloride 89* 92* 96 99   CO2 '25 23 24 27   ' UN 22* 21* 21* 17   Creatinine 0.82 0.85 0.74 0.61   Glucose 98 112* 125* 112*   Calcium 8.8 8.8 9.3 8.3*   Magnesium 1.7  --  1.8 1.6   Phosphorus 4.9*  --  5.8* 4.9*          Assessment:  72 y.o. female with h/o recent PE and pancreatic cancer POD # 46  status post whipple procedure complicated by delayed gastric emptying.  NGT replaced on 04/04/17. UGI on 04/12/17 concerning for obstruction just beyond the [place] in the efferent limb.  Course complicated on 8/85/02 by rising LFTs and  sepsis with CT concerning for cholangitis given biliary tree obstruction and PV compression due to hematoma and afferent limb distention in setting of PE treatment. Is s/p IR PTC on 04/16/17 and IR percutaneous aspiration of anterior abdominal fluid collection on 04/24/17. IR LUQ perc drain placement 3/1. IR anterior abdominal 65f perc drain placement 3/3. IR anterior perc drain into left hepatic collection 3/6.    Plan:  - Made NPO pending management of abdominal collections. Previously on Regular Diet. Unable to safely place PEG per Thoracic and GI recommendations. [place] 12 hours.  - Plan to contact IR for drainage of pleural effusions and abdominal collections with possible drain placement. Appreciate assistance.  - Plan to contact Infectious Disease concerning antibiotic regimen. Will plan to restart previous IV antibiotics (Ertapenem and Daptomycin) pending their recommendations.  - Hyponatremia: Plan to free-water restrict to 750cc/24hrs.  - Aggressive bowel regimen  - Biliary drain removed 3/26  - SCDs, Lovenox for VTE ppx (hold therapeutic anticoagulation)   - Dispo: Pending clinical course, adequate nutrition/PO intake. Pt has Physical Debilitation requiring a rehab setting. PT/OT recommending SNF.    KBishop Dublin MD  06/06/2017  6:01 AM   General Surgery Resident      HPB-GI Attending Addendum:   I personally examined the patient, reviewed the notes, and discussed the plan of care with the residents and patient. Agree with detailed resident's note. Please see the note above for details of history, exam, labs, assessment/plan which reflect my input.      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     Acute tachycardia. Possible SIRS.   CT Scan performed. Previous collections improved with persistent effusion and small new hepatic collection. Unclear whether this  is sterile or infection.   Add intravenous antibiotics.      Hyperglycemia with insulin sliding scale and recurrent need for insulin. continue   Post operative anemia. Stable. No need for transfusion  Bilateral pleural effusions. Stable. No oxygen requirement.   Pulmonary embolism. Present on admission. lovenox restarted.  Hypoalbuminemia. Moderate malnutrition. Tube feedings at goal.cycled. Encourage po intake.   inadequate oral intake alone. Requesting PEG tube.   Depression. Encouragement provided. Slightly improved. Ritalin helpful. Hallucinations with some improvement.   Constipation . Aggressive regimen.   Physical debility. Requires rehabilitation setting.      Delano Metz, MD, FACS  Hepatobiliary, Pancreas & GI Surgery

## 2017-06-06 NOTE — Progress Notes (Signed)
Interventional Radiology Pre-Procedure Handoff/Checklist    NPO: [x] YES [] NO [] N/A     Tube feed Stopped: [] YES [] NO [x] N/A     Anticoagulants:lovenox ast eve      Telemetry: [] YES [] NO [] N/A     Transport mode: Bed  Number of Transporters needed: 2      IV access:  ivad    Respiratory: Room Air    Consentable:yes    Alert and Oriented to person, place and time?: yes    Code Status:full       Does patient wear an insulin pump? [] YES [] NO [] N/A     Precautions:none    Allergies: No Known Allergies (drug, envir, food or latex)    Clent Ridges, RN Received Handoff Report from [facility] for IR procedure 11:35 AM

## 2017-06-06 NOTE — Progress Notes (Signed)
Palliative Care Progress Note    HPI: [name] w/ PMH fibromyalgia, pancreatic CA s/p Whipple for 'cure' 03/29/17; s/p ICU w/ intub for resp failure & septic shock, weaned from pressors and extubated to HFNC 2/17 w/ IR drainage bili fluid collection 2/19, s/p IV abx, now all abd drains for abscesses removed. Palliative following for symptoms, started methadone 2/19, weaned dose 2/23 d/t concern for sedation. Pt w/ ongoing poor po intake, now tolerating bolus fds via dubhoff fdg tube. Unfortunately not able to get PEG w/o going to OR & recent clinical deterioration w/ suspicion of infx in R pleural, now awaiting IR for draining and pigtail. Goal dispo SNF Rehab.    Subjective: "I'm exhausted"    Objective: chart reviewed, pt unfortunately w/ hemodynamic instability, fever, tachycadia, increased WOB and hypoxia last noc c/w sepsis last noc - now restarted on IV abx w/ improvement clinically; pt is very lethargic today and relayed feeling physically and emotionally exhausted; spent time w/ dtr [name] offering emotional support as she was here until 2 am with pt worrying     Palliative Care ROS:  Pain mild  Nausea no  Dyspnea no  Weakness yes  Constipation no  Delirum no  Last stool 4/3     Physical Examination:   BP: (110-150)/(60-82)   Temp:  [36.2 C (97.2 F)-37.6 C (99.6 F)]   Temp src: Temporal (04/04 1229)  Heart Rate:  [105-122]   Resp:  [16-18]   SpO2:  [92 %-98 %]   Gen appearance: [name], laying in bed, very lethargic and feeling down emotionally -endorses anticipatory anxiety about going to IR for pigtail, no acute symptoms  Lungs: easy resp, NC  Extrem: mod LE edema, wearing TED stockings  Skin: warm, dry  Neuro:  A+O x 3; at baseline ambulates short distances w/ walker & standby assist; today very lethargic and in bed    Assessment/Plan: [name] w/ pancreatic CA, s/p Whipple in Jan. s/p TPN, s/p perc drains and IV abx w/ resolving abd abscesses, ongoing L pleural effusion. Pt started  trial of bolus TFs if she does not eat at least 50% meal. Pt's need for dubhoff is barrier to discharge to SNF Rehab and may likely need LTC regardless. May consider taking pt to OR to place PEG which would move forward her dispo planning and ability to get more intensive rehab. Unfortunately pt had s/s sepsis last noc, better w/ restart of IV abx, now anticipated to go to IR for L pleural effusion drainage and possible pigtail.    Pain/dyspnea  Acetaminophen 1 gm po TID  Methadone 5/7.5/7.5 mg po TID (total 20 mg/d, last increased 3/11)  Dilaudid 2 mg po Q 4 hrs prn (x 3 on 4/3, x 1 on 4/4)  Lidocaine patch daily x 2 (mid back, LUE)  Encourage incentive spirometry prn     Anxiety/agitation/nausea/fatigue  Duloxetine 20 mg po daily  Reglan 10 mg po 4x/d (AC & QHS)  Pantoprazole 40 mg po BID  Calcium carbonate 1 gm po BID prn (last x 1 on 3/25)  Ondansetron 4 mg IV Q 6 hrs prn   Ritalin 2.5 mg po at 8 am and 2.5 mg at 12 pm (reduced dose 3/26) per [facility] unlikely ritalin is cause of mild confusion or appetite suppression  Mirtazapine 7.5 mg po QHS (started 4/2)    Prevention of constipation, last stool 4/3  Colace 200 mg po BID  Miralax 17 gm po daily  Senna  2 tab po BID  Bisacodyl supp pr daily     Dry flaky edematous Skin  Bacitracin oint BID to BLEs  Eucerin prn for BLEs    GOC/prognosis  Full code  Dtr Rod Mae is HCP  Dispo plans now for SNF Rehab and may likely need LTC for a period in addition   4/1 initiating trial of PO meals first & bolus fds after if not eating at least 50% of meal.  Pt may still need PEG fdg tube placed in OR for feasible discharge plan    Pt case discussed w/ RN and covering provider.    We will continue to follow along, please call if questions/concerns.    Total Time Spent 35 minutes: >50% of time was spent in counseling and/or coordination of care.     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    Hypothyroidism E03.9    Pancreatic cancer C25.9    Anemia D64.9     hx of Pulmonary embolism I26.99    Depression F32.9    Sepsis A41.9       No Known Allergies (drug, envir, food or latex)    Scheduled Meds:    ertapenem  1,000 mg Intravenous Q24H    [START ON 06/07/2017] daptomycin IV  8 mg/kg (Adjusted) Intravenous Q24H    daptomycin IV  8 mg/kg (Adjusted) Intravenous Q24H    LORazepam  0.5 mg Intravenous Once    HYDROmorphone PF  0.5 mg Intravenous Once    insulin glargine  4 Units Subcutaneous Nightly    lidocaine  1 patch Transdermal Q24H    methadone  5 mg Oral Daily    PROSOURCE NO CARB  30 mL Oral Daily with breakfast    triamterene-hydrochlorothiazide  1 tablet Oral QAM    bacitracin zinc-polymyxin B sulfate   Topical 2 times per day    lidocaine  1 patch Transdermal Q24H    methadone  7.5 mg Oral BID    polyethylene glycol  17 g Oral Daily    methylphenidate  2.5 mg Oral BID    fluconazole  800 mg Oral Daily    pantoprazole  40 mg Oral BID AC    senna  2 tablet Oral 2 times per day    bisacodyl  10 mg Rectal Daily    enoxaparin  40 mg Subcutaneous Daily    DULoxetine  20 mg Oral Daily    metoclopramide  10 mg Oral 4x Daily AC & HS    docusate sodium  200 mg Oral 2 times per day    acetaminophen  1,000 mg Oral Q8H    levothyroxine  137 mcg Oral Daily       Continuous Infusions:    sodium chloride 100 mL/hr (06/06/17 1459)       PRN Meds:  ibuprofen, HYDROmorphone, naloxone, sodium chloride, dextrose, calcium carbonate, heparin lock flush, ondansetron

## 2017-06-06 NOTE — Plan of Care (Signed)
Problem: Safety  Goal: Patient will remain free of falls  Outcome: Maintaining      Problem: Pain/Comfort  Goal: Patient's pain or discomfort is manageable  Outcome: Maintaining      Problem: Mobility  Goal: Functional status is maintained or improved - Geriatric  Outcome: Maintaining      Problem: Nutrition  Goal: Patient's nutritional status is maintained or improved  Outcome: Maintaining      Problem: Bowel Elimination  Goal: Elimination patterns are normal or improving  Outcome: Maintaining      Problem: Post-Operative Bowel Elimination  Goal: Elimination pattern is normal or improving  Outcome: Maintaining      Problem: Psychosocial  Goal: Demonstrates ability to cope with illness  Outcome: Maintaining

## 2017-06-06 NOTE — Plan of Care (Signed)
Bowel Elimination     Elimination patterns are normal or improving Maintaining        Fluid and Electrolyte Imbalance     Fluid and Electrolyte imbalance Maintaining        GI Bleeding Elimination     Elimination of patterns are normal or improving Maintaining        Nutrition     Patient's nutritional status is maintained or improved Maintaining     Nutritional status is maintained or improved - Geriatric Maintaining        Pain/Comfort     Patient's pain or discomfort is manageable Maintaining        Post-Operative Bowel Elimination     Elimination pattern is normal or improving Maintaining        Post-Operative Hemodynamic Stability     Maintain Hemodynamic Stability Maintaining        Psychosocial     Demonstrates ability to cope with illness Maintaining        Safety     Patient will remain free of falls Maintaining          Mobility     Functional status is maintained or improved - Geriatric Progressing towards goal     Patient's functional status is maintained or improved Progressing towards goal

## 2017-06-06 NOTE — Progress Notes (Signed)
Assumed care of pt at 1900. Pt A&O x 1. Disoriented to place and time. Decreased LOC, drowsy. Tachycardia 120s. Pt denied chest pain or dyspnea. Daughter expressed concerns over pt's status. Alvira Monday, NP notified, at bedside to evaluate. Continuous pulse ox started. EKG, labs, CXR done as ordered.     Pt went off the unit for CT abd/pelvis/chest in bed on 2L NC. Returned at 2323. Still tachycardic HR 115, other VS stable. PO dilaudid given for pain. Pt currently sleeping, call light within reach. Will continue to monitor.

## 2017-06-06 NOTE — Plan of Care (Signed)
Psychosocial     Demonstrates ability to cope with illness Progressing towards goal          Safety     Patient will remain free of falls Progressing towards goal          Pain/Comfort     Patient's pain or discomfort is manageable Progressing towards goal          GI Bleeding Elimination     Elimination of patterns are normal or improving Progressing towards goal          Fluid and Electrolyte Imbalance     Fluid and Electrolyte imbalance Progressing towards goal

## 2017-06-07 ENCOUNTER — Inpatient Hospital Stay: Payer: Medicare (Managed Care)

## 2017-06-07 LAB — CBC AND DIFFERENTIAL
Baso # K/uL: 0.1 10*3/uL (ref 0.0–0.1)
Basophil %: 0.7 %
Eos # K/uL: 0 10*3/uL (ref 0.0–0.4)
Eosinophil %: 0.4 %
Hematocrit: 22 % — ABNORMAL LOW (ref 34–45)
Hemoglobin: 6.6 g/dL — ABNORMAL LOW (ref 11.2–15.7)
IMM Granulocytes #: 0 10*3/uL
IMM Granulocytes: 0.6 %
Lymph # K/uL: 0.7 10*3/uL — ABNORMAL LOW (ref 1.2–3.7)
Lymphocyte %: 10.4 %
MCH: 29 pg/cell (ref 26–32)
MCHC: 31 g/dL — ABNORMAL LOW (ref 32–36)
MCV: 93 fL (ref 79–95)
Mono # K/uL: 0.6 10*3/uL (ref 0.2–0.9)
Monocyte %: 8.7 %
Neut # K/uL: 5.4 10*3/uL (ref 1.6–6.1)
Nucl RBC # K/uL: 0 10*3/uL (ref 0.0–0.0)
Nucl RBC %: 0.1 /100 WBC (ref 0.0–0.2)
Platelets: 493 10*3/uL — ABNORMAL HIGH (ref 160–370)
RBC: 2.3 MIL/uL — ABNORMAL LOW (ref 3.9–5.2)
RDW: 17.6 % — ABNORMAL HIGH (ref 11.7–14.4)
Seg Neut %: 79.2 %
WBC: 6.8 10*3/uL (ref 4.0–10.0)

## 2017-06-07 LAB — BODY FLUID CELL COUNT
Nucl Cell,FL: 1604 /uL
RBC,FL: 2680 /uL
Segs #,FL: 257 /uL
Segs %,Fl: 16 %

## 2017-06-07 LAB — EKG 12-LEAD
P: 19 deg
PR: 176 ms
QRS: 30 deg
QRSD: 66 ms
QT: 352 ms
QTc: 447 ms
Rate: 97 {beats}/min
Severity: BORDERLINE
Statement: BORDERLINE
T: 58 deg

## 2017-06-07 LAB — LACTATE DEHYDROGENASE, BODY FLUID: LD,FL: 138 U/L

## 2017-06-07 LAB — COMPREHENSIVE METABOLIC PANEL
ALT: 49 U/L — ABNORMAL HIGH (ref 0–35)
AST: 76 U/L — ABNORMAL HIGH (ref 0–35)
Albumin: 2.3 g/dL — ABNORMAL LOW (ref 3.5–5.2)
Alk Phos: 453 U/L — ABNORMAL HIGH (ref 35–105)
Anion Gap: 17 — ABNORMAL HIGH (ref 7–16)
Bilirubin,Total: <0.2 mg/dL (ref 0.0–1.2)
CO2: 20 mmol/L (ref 20–28)
Calcium: 8.1 mg/dL — ABNORMAL LOW (ref 8.6–10.2)
Chloride: 94 mmol/L — ABNORMAL LOW (ref 96–108)
Creatinine: 0.81 mg/dL (ref 0.51–0.95)
GFR,Black: 85 *
GFR,Caucasian: 73 *
Glucose: 101 mg/dL — ABNORMAL HIGH (ref 60–99)
Lab: 17 mg/dL (ref 6–20)
Potassium: 4 mmol/L (ref 3.3–5.1)
Sodium: 131 mmol/L — ABNORMAL LOW (ref 133–145)
Total Protein: 6.7 g/dL (ref 6.3–7.7)

## 2017-06-07 LAB — PROTEIN, BODY FLUID: Protein,FL: 5.1 g/dL

## 2017-06-07 LAB — SEDIMENTATION RATE, AUTOMATED: Sedimentation Rate: 35 mm/hr — ABNORMAL HIGH (ref 0–30)

## 2017-06-07 LAB — TYPE AND SCREEN
ABO RH Blood Type: A NEG
Antibody Screen: NEGATIVE

## 2017-06-07 LAB — POCT GLUCOSE: Glucose POCT: 100 mg/dL — ABNORMAL HIGH (ref 60–99)

## 2017-06-07 LAB — LACTATE DEHYDROGENASE: LD: 267 U/L — ABNORMAL HIGH (ref 118–225)

## 2017-06-07 LAB — GLUCOSE, BODY FLUID: Glucose,FL: 99 mg/dL

## 2017-06-07 LAB — CRP: CRP: 186 mg/L — ABNORMAL HIGH (ref 0–10)

## 2017-06-07 MED ORDER — POLYETHYLENE GLYCOL 3350 PO PACK 17 GM *I*
17.0000 g | PACK | Freq: Every day | ORAL | Status: DC
Start: 2017-06-08 — End: 2017-06-25
  Administered 2017-06-08 – 2017-06-17 (×3): 17 g via GASTROSTOMY
  Filled 2017-06-07 (×9): qty 17

## 2017-06-07 MED ORDER — ENOXAPARIN SODIUM 80 MG/0.8ML IJ SOSY *I*
1.0000 mg/kg | PREFILLED_SYRINGE | Freq: Two times a day (BID) | INTRAMUSCULAR | Status: DC
Start: 2017-06-07 — End: 2017-06-11
  Administered 2017-06-07 – 2017-06-09 (×5): 80 mg via SUBCUTANEOUS
  Filled 2017-06-07 (×8): qty 0.8

## 2017-06-07 MED ORDER — LIDOCAINE HCL 1 % IJ SOLN *I*
INTRAMUSCULAR | Status: AC
Start: 2017-06-07 — End: 2017-06-07
  Filled 2017-06-07: qty 20

## 2017-06-07 MED ORDER — SODIUM CHLORIDE 0.9 % IV SOLN WRAPPED *I*
3.0000 mL/h | Status: DC
Start: 2017-06-07 — End: 2017-06-19

## 2017-06-07 NOTE — Preop H&P (Signed)
Interventional Radiology H&P    HPI : Amanda Galloway is a 72 y.o. female with a hx of pancreatic adenocarcinoma s/p whipple and a left sided pleural effusion who presents for a left sided thoracentesis and possible left sided chest tube placement.    History:    Past Medical History:   Diagnosis Date    Acute kidney failure ([place]) 03/31/2017    Cancer     Depression     Fibromyalgia     Hypothyroidism       Past Surgical History:   Procedure Laterality Date    CHOLECYSTECTOMY      CHOLECYSTECTOMY, LAPAROSCOPIC  09/06/2016    HYSTERECTOMY  08/1986    Fibroids    KNEE SURGERY Right     PR INSERT TUNNELED CV CATH WITH PORT Right 10/04/2016    Procedure: Right IJ MEDIPORT Insertion;  Surgeon: Delano Metz, MD;  Location: [facility] MAIN OR;  Service: Oncology General    PR LAP,DIAGNOSTIC ABDOMEN N/A 10/04/2016    Procedure: LAPAROSCOPY DIAGNOSTIC;  Surgeon: Delano Metz, MD;  Location: [facility] MAIN OR;  Service: Oncology General    PR PART [place] PANC,PROX+REMV DUOD+ANAST N/A 03/29/2017    Procedure: WHIPPLE PROCEDURE;  Surgeon: Delano Metz, MD;  Location: [facility] MAIN OR;  Service: Oncology General    ROTATOR CUFF REPAIR Right     TONSILLECTOMY AND ADENOIDECTOMY        Prescriptions Prior to Admission   Medication Sig    methadone (DOLOPHINE) 5 MG tablet Take 15 mg by mouth nightly    LORazepam (ATIVAN) 0.5 MG tablet Take 1 tablet (0.5 mg total) by mouth 2 times daily as needed for Anxiety   Max daily dose: 1 mg (Patient taking differently: Take 0.5 mg by mouth 2 times daily as needed for Anxiety   0.5mg  nightly and prn)    methadone (DOLOPHINE) 5 MG tablet Take 20mg  in the morning, 20mg  in afternoon and 15mg  at night. (Patient taking differently: 10 mg 2 times daily   Take 20mg  in the morning, 20mg  in afternoon and 15mg  at night.)    enoxaparin (LOVENOX) 100 mg/mL injection Inject 1 Syringe (100 mg total) into the skin daily (Patient taking differently: Inject 100 mg into the skin 2 times daily )    potassium chloride SA  (KLOR-CON M20) 20 mEq  tablet Take 20 mEq by mouth 2 times daily    famotidine (PEPCID) 20 MG tablet Take 1 tablet (20 mg total) by mouth 2 times daily    escitalopram (LEXAPRO) 20 MG tablet Take 1 tablet (20 mg total) by mouth daily (Patient taking differently: Take 20 mg by mouth nightly   )    amitriptyline (ELAVIL) 25 MG tablet Take 1 tablet (25 mg total) by mouth nightly    dexamethasone (DECADRON) 4 MG tablet Take 1 tablet (4 mg total) by mouth 2 times daily (Patient taking differently: Take 4 mg by mouth daily (with breakfast)   )    docusate sodium (COLACE) 100 MG capsule Take 2 capsules (200 mg total) by mouth 2 times daily    metoclopramide (REGLAN) 5 MG tablet Take 1 tablet (5 mg total) by mouth 3 times daily (before meals)    pantoprazole (PROTONIX) 40 MG EC tablet Take 1 tablet (40 mg total) by mouth 2 times daily (before meals)   Swallow whole. Do not crush, break, or chew.    senna (SENOKOT) 8.6 MG tablet Take 2 tablets by mouth 2 times daily    PREMARIN 0.625  MG tablet Take 0.625 mg by mouth every morning       ondansetron (ZOFRAN-ODT) 4 MG disintegrating tablet Take 1 tablet (4 mg total) by mouth 3 times daily as needed for Nausea   Place on top of tongue.    levothyroxine (SYNTHROID, LEVOTHROID) 137 MCG tablet Take 1 tablet (137 mcg total) by mouth daily (before breakfast)    Vitamins - senior (CENTRUM SILVER) TABS Take 1 tablet by mouth daily (Patient taking differently: Take 1 tablet by mouth every morning   )    triamterene-hydrochlorothiazide (MAXZIDE-25) 37.5-25 MG per tablet Take 1 tablet by mouth daily (Patient taking differently: Take 1 tablet by mouth every morning   )    fluorouracil (ADRUCIL) 50 MG/ML injection Administer 3,477 mg into the vein every 14 days   Start in infusion center.  Infuse over 46 hours at home.    pegfilgrastim (NEULASTA) 6 MG/0.6ML injection syringe Inject 0.6 mLs (6 mg total) into the skin once    Meds Exist in Therapy Plan Peg-Filgrastim     heparin 100 UNIT/ML injection 5 mLs (500 Units total) by Intracatheter route as needed (for line care)    sodium chloride 0.9 % flush 10 mLs by Intracatheter route as needed (for line care)     Current Medications:    ertapenem  1,000 mg Intravenous Q24H    daptomycin IV  8 mg/kg (Adjusted) Intravenous Q24H    insulin glargine  4 Units Subcutaneous Nightly    lidocaine  1 patch Transdermal Q24H    methadone  5 mg Oral Daily    PROSOURCE NO CARB  30 mL Oral Daily with breakfast    triamterene-hydrochlorothiazide  1 tablet Oral QAM    bacitracin zinc-polymyxin B sulfate   Topical 2 times per day    lidocaine  1 patch Transdermal Q24H    methadone  7.5 mg Oral BID    polyethylene glycol  17 g Oral Daily    methylphenidate  2.5 mg Oral BID    fluconazole  800 mg Oral Daily    pantoprazole  40 mg Oral BID AC    senna  2 tablet Oral 2 times per day    bisacodyl  10 mg Rectal Daily    enoxaparin  40 mg Subcutaneous Daily    DULoxetine  20 mg Oral Daily    metoclopramide  10 mg Oral 4x Daily AC & HS    docusate sodium  200 mg Oral 2 times per day    acetaminophen  1,000 mg Oral Q8H    levothyroxine  137 mcg Oral Daily     No Known Allergies (drug, envir, food or latex)   Social History   Substance Use Topics    Smoking status: Never Smoker    Smokeless tobacco: Never Used    Alcohol use No      Family History   Problem Relation Age of Onset    Breast cancer Mother         died 73    Cancer Father         NHL, died 31    Heart Disease Father     Diabetes Sister     Obesity Sister         4 siblings        Objective:     BP: (120-138)/(60-80)   Temp:  [36.2 C (97.2 F)-37.1 C (98.8 F)]   Temp src: Temporal (04/05 0834)  Heart Rate:  [94-109]   Resp:  [  16-18]   SpO2:  [91 %-99 %]     No intake/output data recorded.      Physical Exam:    General: NAD  HEENT:NCAT  Cardiac: RRR, S1S2, no M/G/R  Lungs: Decreased left basilar breath sounds  Abdomen: soft, nontender, nondistended, normal bowel  sounds  Extremities: no LEE, good distal pulses  Neuro: grossly intact    Labs:  Recent Results (from the past 24 hour(s))   POCT glucose    Collection Time: 06/06/17 10:12 PM   Result Value    Glucose POCT 82   C reactive protein    Collection Time: 06/07/17 12:31 AM   Result Value    CRP 186 (H)   Sedimentation rate, automated    Collection Time: 06/07/17 12:31 AM   Result Value    Sedimentation Rate 35 (H)   CBC and differential    Collection Time: 06/07/17 12:31 AM   Result Value    WBC 6.8    RBC 2.3 (L)    Hemoglobin 6.6 (L)    Hematocrit 22 (L)    MCV 93    MCH 29    MCHC 31 (L)    RDW 17.6 (H)    Platelets 493 (H)    Seg Neut % 79.2    Lymphocyte % 10.4    Monocyte % 8.7    Eosinophil % 0.4    Basophil % 0.7    Neut # K/uL 5.4    Lymph # K/uL 0.7 (L)    Mono # K/uL 0.6    Eos # K/uL 0.0    Baso # K/uL 0.1    Nucl RBC % 0.1    Nucl RBC # K/uL 0.0    IMM Granulocytes # 0.0    IMM Granulocytes 0.6   Basic metabolic panel    Collection Time: 06/07/17 12:31 AM   Result Value    Glucose 101 (H)    Sodium 131 (L)    Potassium 4.0    Chloride 94 (L)    CO2 20    Anion Gap 17 (H)    UN 17    Creatinine 0.81    GFR,Caucasian 73    GFR,Black 85    Calcium 8.1 (L)   Red blood cells    Collection Time: 06/07/17  7:03 AM   Result Value    Blood product type Red Blood Cells    Unit Number [id number]    Dispense status Ready    Product code [id number]    Component blood type A Neg    Coding system [id number]    Coded Blood type 0600   Type and screen    Collection Time: 06/07/17  7:50 AM   Result Value    ABO RH Blood Type A RH NEG    Antibody Screen Negative           Imaging:      Assessment and Plan:    72 y.o. female with a hx of pancreatic adenocarcinoma s/p whipple and a left sided pleural effusion presents for a left sided thoracentesis and possible left sided chest tube placement.    - NPO  -   - Procedure, risks and benefits discussed.     Maryanna Shape, MD   Interventional Radiology     06/07/2017 at 9:09  AM

## 2017-06-07 NOTE — Progress Notes (Signed)
[employer] Nursing Procedure Note    Amanda Galloway  [id number]    Procedure: Thoracentesis with possible pigtail placement ( 10 French pigtail placed)        Status: Completed    Patient tolerated procedure well     Specimen Collection: yes    Sponge count: N/A    Fluid Removed:Yes, color:yellow, amount: 60 ml  Procedure Dressing Site located:Back  Dressing Type:sterile gauze/tegaderm and Vaseline gauze  Biopatch:no  Dressing status:Clean, dry and intact   Hematoma:Not evident  Medication received:None  Cardiovascular:   Peripheral Pulses: N/A      Fistula: N/A    Neuro Assessment:N/A    Implant patient information given to patient or parent/guardian:N/A    Report given to:Unit Nurse. Unit [facility] Name Butch Penny, bedside nurse      Last Filed Vitals    06/07/17 0945   BP: 104/53   Pulse: 101   Resp: 16   Temp:    SpO2: 95%

## 2017-06-07 NOTE — Progress Notes (Signed)
SNF packet sent to [facility] for their review to see if she is candidate for their facility after she has stabilized medically. Await review of packet from [facility]. Daughter and pt updated. Will continue to assist as needed.

## 2017-06-07 NOTE — Interval H&P Note (Signed)
UPDATES TO PATIENT'S CONDITION on the DAY OF SURGERY/PROCEDURE    I. Updates to Patient's Condition (to be completed by a provider privileged to complete a H&P, following reassessment of the patient by the provider):    Day of Surgery/Procedure Update:  History  History reviewed and no change    Physical  Physical exam updated and no change            II. Procedure Readiness   I have reviewed the patient's H&P and updated condition. By completing and signing this form, I attest that this patient is ready for surgery/procedure.    III. Attestation   I have reviewed the updated information regarding the patient's condition and it is appropriate to proceed with the planned surgery/procedure.    Maryanna Shape, MD as of 9:12 AM 06/07/2017

## 2017-06-07 NOTE — Invasive Procedure Plan of Care (Signed)
Invasive Procedure Plan of Care (Consent Form 419):   Condition(s) Addressed: Collapsed lung or fluid/blood in the chest-left sided thoracentesis and chest tube placement.   Performing Provider: Maryanna Shape   Side: Left    Procedure: Insertion of left chest tube and thoracentesis.   Special Equipment: None   Planned Anesthesia: Local   Benefits: Expansion of collapsed lung, removal of fluid or blood from the chest, improvement in breathing..   Risks: Bleeding, infection, local pain, nerve injury, injury to the lung or chest wall, scarring, rarely injury to heart or major vessels. Occasionally, a chest tube does not resolved the problem and further procedures are necessary. In rare circumstances this procedure may result in death.   Alternatives: Watching and waiting for your condition to resolve or performating a thoracentesis.   Expected Length of Stay:  day(s)     I, or a designated member of my surgical team, have discussed the planned procedure, including the potential for any transfusion of blood products or receipt of tissue as necessary, expected benefits, the potential complications and risks and possible alternatives and their benefits and risks with the patient or the patient's surrogate. In my opinion, the patent or the patient's surrogate understands the proposed procedure, its risks, benefits, and alternatives.    Electronically signed by Maryanna Shape, MD at 9:06 AM     Patient Consent:  I hereby give my consent and authorize Amanda Galloway  (The list of possible assistants, all of whom are privileged to provide surgical services at the hospital, is available)  To treat the following: Collapsed lung or fluid/blood in the chest-left sided thoracentesis and chest tube placement.  Procedure includes: Insertion of left chest tube and thoracentesis.  Laterality: Left  1 The care provider has explained my condition to me, the benefits of having the above treatment procedure, and alternate ways of  treating my condition. I understand that no guarantees have been made to me about the result of the treatment. The alternatives to this procedure include: Watching and waiting for your condition to resolve or performating a thoracentesis.   2 The care provider has discussed with me the reasonably foreseeable risks of the treatment and that there may be undesirable results. The risks that are specifically related to this procedure include: Bleeding, infection, local pain, nerve injury, injury to the lung or chest wall, scarring, rarely injury to heart or major vessels. Occasionally, a chest tube does not resolved the problem and further procedures are necessary. In rare circumstances this procedure may result in death.   3 I understand that during the treatment a condition may be discovered which was not known before the treatment started. Therefore, I authorize the care provider to perform any additional or different treatment which is thought necessary and available.   4 Any tissue, parts, or substances removed during the procedure may be retained or disposed of in accordance with customary scientific, educational and clinical practice.   5 Vendor information if appropriate: If a vendor representative is expected to be present during my procedure, it has been explained to me that the vendor representative works for (manufacturer of the device to be used) and that his/her role includes . I consent to the vendor representative's presence and involvement as described. If circumstances change and a decision is made during my procedure that a vendor representative's presence is needed, I will be notified of the above after my procedure is completed.  Equipment: None   6 If blood products  are needed, I would agree:    7 If tissue products are needed, I would agree:    8 Blood/Tissue use limitations and/or exclusions:       I have carefully read and fully understand this informed consent form, and have had sufficient  opportunity to discuss my condition and the above procedure(s) with the care provider and his/her associates, and all of my questions have been answered to my satisfaction. I understand that my surgeon/provider performing the procedure may not be physically present in the operating/procedure room the entire time that I am there. My surgeon/provider has answered my questions regarding this and how it may relate to my surgery/procedure. I agree to the Plan of Care as outlined above.                       Patient Signature   (or Parent/Legal Guardian if pt is unable to sign or is a minor)  Date/Time     Electronic Signatures will display at the bottom of the consent form.

## 2017-06-07 NOTE — Progress Notes (Signed)
Surgical Oncology/Hepatobiliary Surgery Progress Note     LOS: 70 days     Subjective:  Interval Events:     NAEO. Afebrile. Pt started on previous IV Antibiotics (Ertapenem, Daptomycin, Fluconazole) yesterday per ID recommendations.     Continuing to complain of lethargy. Denies abd pain. (+) BM. Tolerating TF's.    IR L pleural pigtail cancelled yest. Plan for procedure this AM    Objective:  Vitals Sign Ranges for Past 24 Hours:  BP: (120-139)/(60-80)   Temp:  [36.2 C (97.2 F)-37.1 C (98.8 F)]   Temp src: Temporal (04/05 0347)  Heart Rate:  [98-109]   Resp:  [16-18]   SpO2:  [91 %-99 %]     Physical Exam:  General Appearance: appears uncomfortable. lethargic  Cardiac: regular rate  Respiratory: non-labored breathing on room air  Abdomen: Soft, non tender to palpation, non distended, incision well healed   Extremities: warm, erythema of the bilateral anterior calves with small amount of skin breakdown, minimal edema no posterior calf tenderness     Labs:    CBC:    Recent Labs  Lab 06/07/17  0031 06/06/17  0114 06/05/17  2305 06/03/17  0518   WBC 6.8 9.5 10.5* 10.2*   Hemoglobin 6.6* 6.9* 7.3* 7.6*   Hematocrit 22* 22* 24* 25*   Platelets 493* 542* 573* 932*       Metabolic Panel:    Recent Labs  Lab 06/06/17  0114 06/05/17  2305 06/03/17  0518   Sodium 126* 131* 135   Potassium 4.7 4.8 4.7   Chloride 89* 92* 96   CO2 _0 UN 22* 21* 21*   Creatinine 0.82 0.85 0.74   Glucose 98 112* 125*   Calcium 8.8 8.8 9.3   Magnesium 1.7  --  1.8   Phosphorus 4.9*  --  5.8*          Assessment:  72 y.o. female with h/o recent PE and pancreatic cancer POD # 70  status post whipple procedure complicated by delayed gastric emptying.  NGT replaced on 04/04/17. UGI on 04/12/17 concerning for obstruction just beyond the Lucas in the efferent limb.  Course complicated on 3/55/73 by rising LFTs and sepsis with CT concerning for cholangitis given biliary tree obstruction and PV compression due to hematoma and afferent limb  distention in setting of PE treatment. Is s/p IR PTC on 04/16/17 and IR percutaneous aspiration of anterior abdominal fluid collection on 04/24/17. IR LUQ perc drain placement 3/1. IR anterior abdominal 35f perc drain placement 3/3. IR anterior perc drain into left hepatic collection 3/6.    Plan:  - Currently NPO. Previously on Regular Diet with TF's. Unable to safely place PEG per Thoracic and GI recommendations. Resume TF's post procedure  - Pending IR for drainage of pleural effusions today. Appreciate assistance.  - Plan to continue previous IV antibiotic regimen (Ertapenem and Daptomycin) per ID Recommendations.  - Hyponatremia: Cont NS inf  - Anemia; Hct 22. Hgb 6.6. Transfuse 1u pRBC in anticipation of IR procedure today  - Aggressive bowel regimen  - Biliary drain removed 3/26  - SCDs, Resume therap lovenox post IR procedure  - Dispo: Pending clinical course, adequate nutrition/PO intake. Pt has Physical Debilitation requiring a rehab setting. PT/OT recommending SNF.    JBelinda Fisher MD  06/07/2017     7:01 AM   General Surgery Resident

## 2017-06-07 NOTE — Procedures (Signed)
Procedure Report    Please enter procedure, pre- and and post-procedure diagnoses in fields above and removed this line of text.    PROCEDURE NOTE    Indications  Left pleural effusion    Procedure Details  10Fr left sided chest tube placement      Guide Wire Removed : yes    Findings  Moderate left pleural effusion     Complications  None     Condition  good    Plan/Orders chest tube to gravity drainage, as neede    EBL: Minimal     Specimens  yes. 60 cc of clear yellow fluid was submitted to the laboratory    Maryanna Shape, MD  06/07/2017  1:01 PM

## 2017-06-07 NOTE — Continuity of Care (Signed)
[place] of Quality and Surveillance for Nursing Homes and ICFs/MR  Patient Name  Amanda Galloway MR Number  [id number] Account Number   [facility]                Patient Review Instrument  (HC-PRI)               RUG II:  CB4     I. ADMINISTRATIVE DATA     1. Operating Certificate Number  [id number] 2. Social Security Number  [id number]   3. Official Name of Hospital Completing this Review  [facility]    4A. Patient Name  Amanda Galloway 10. Sex  female   4B. [place] 11A. Date of Hospital Admission or Initial Agency Visit  03/29/2017   5. Date of PRI Completion  June 07, 2017 11B. Date of Alternate level of Care Status in Hospital (if applicable)    6. Account Number   12. Medicaid Number     7. Hospital Room Number  BMW4-13/KGM0-1027 25. Medicare Number  [id number]   5. Name of Unit/Division/Building  WCC5-13/WCC5-1301 14. Primary Payor     9. Date of Birth  [phone] 34. Reason for [facility] Completion  1 - RHCF application from hospital     II. MEDICAL EVENTS     16. Decubitus Level:        Location:  No reddened skin or breakdown     17. MEDICAL CONDITIONS During the past week:  1=Yes  2=No 18. MEDICAL TREATMENTS 1=Yes  2=No   A. Comatose No A. Tracheostomy Care/Suct No         B. Dehydration No B. Suctioning/General No         C. Internal Bleeding No C. Oxygen (Daily) No              D. Stasis Ulcer No D Respiratory Care (Daily) Yes (incentive spirometer use every 2 hours w/a)         E. Terminally Ill No E. Nasal Gastric Feeding Yes (unable to place PEG. TF via [place].)         F. Contractures No F. Parenteral Feeding No         G. Diabetes Mellitus No G. Wound Care Yes (4 small skin tears with Alleyvn covering, elbows, back, leg.)         H. Urinary Tract Infection No H. Chemotherapy No         I. HIV Infection Symptomatic No I. Transfusion No         J. Accident No J. Dialysis No         K. Ventilator Dependent No K. Bowel and  Bladder Rehab No           L. Catheter (Indwelling or External) Yes (just got a L pleural pigtail for pl. effusion)              M. Physical Restraints  (Daytime) No             Adapted from [id number]    Version: 002F  Review Type: Update review  06/07/2017  2 of [place] of Quality and Surveillance for Nursing Homes and ICFs/MR  Patient Name  Amanda Galloway MR Number  [id number] Account Number   [place]  Patient Review Instrument  (HC-PRI)               III. Activities of Daily Living     19. Eating 2-Requires intermittent supervision (that is, verbal encouragement/guidance) and/or minimal physical assistance with minor parts of eating, such as cutting food, buttering bread or opening milk carton.    20. Mobility 3-Walks with constant one-to-one supervision and/or constant physical assistance.    21. Transfer 3-Requires one person to provide constant guidance, steadiness and/or physical assistance.  Patient may participate in transfer.    22. Toileting 2-Requires intermittent supervision or minor physical assistance (for example, clothes adjustment or washing hands).      IV. BEHAVIORS     23. Verbal Disruption No known history   24. Physical Agression No known history   25. Disruptive, Infantile or Socially Inappropriate Behavior No known history   26. Hallucinations No     V. SPECIALIZED SERVICES     27A. Physical Therapy  27B. Occupational Therapy    Level: Restorative therapy- requires and is currently receiving physical and/or occupational therapy for the past week Level: Restorative therapy- requires and is currently receiving physical and/or occupational therapy for the past week   Actual days/week:1 No - less than 5 times/week Actual days/week:1 No - less than 5 times/wk   Actual hours/week:30 minutes No - less than 2.5 hrs/wk Actual hours/week:40 minutes No - less than 2.5 hrs/wk     28. Number of Physician Visits - 0      VI. DIAGNOSIS     29. Primary Problem: Malignant neoplasm of pancreas, unspecified location of malignancy     VII. PLAN OF CARE SUMMARY     30. Primary Diagnosis: Encounter Diagnoses   Name Primary?    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   31A. Rehabilitation Potential: Remains appropriate for skilled therapy            Comment:    31B. Current therapy care plan: Treatment Interventions: Restorative PT  PT Frequency: 2-4x/wk  Mobility Recommendations: 1A with RW. Please assist with ambulation 3x/day  Referral Recommendations: PM&R Consult, OT  Discharge Recommendations: Acute Rehab  PT Discharge Equipment Recommended: To be determined  Assessment/Recommendations Reviewed With:: Patient, Nursing, Midlevel provider, Social Worker  Next PT Visit: BLE strengthening, progress all mobility.   OT Frequency: 2-3x/wk  Patient Will Benefit From: ADL retraining, Functional transfer training, UE strengthening/ROM, Endurance training, Cognitive re-education, Patient/Family training, Compensatory technique education, IADL training, Exercises.   OT Discharge Recommendations: [place]  OT Discharge Equipment Recommended:  (TBD at rehab)  Additional Comments: Currently, pt requires extensive assist with basic adl tasks and functional transfers and is functioning below baseline of independence.  Also presents with fatigue and generalized weakness.  Recommend SNF  rehab prior to returning home to maximize level of independence.   Hospital Stay Recommendations: Encourage participation in adls and to sit up in chair to improve overall  strength. .            Comment:    32. Medications: See below   33A. Allergies: Patient has no known allergies (drug, envir, food or latex).   33B: Treatments: See e-records kardex   33C: Abnormal Labs: See below   33D: Precautions:  falls           Comment:    33E: Pacemaker  no   19F: Diet: Dietary modular: Pro[employer] NoCarb Liquid Protein 30 mL  Diet tube feeding with tray [facility]) Regular          34. Race/Ethnic Group White or Caucasian [1]     20. Certification    I have personally observed/interviewed this patient and completed this H/C PRI. No - Staff/Chart           I certify that the information contained herein is a true abstract of this patient's condition and medical record. Yes   Certified Assessor: Danella Sensing, RN   Identification Number: [id number]             Adapted from [id number]    Version: 002F  Review Type: Update review  06/07/2017  2 of 1    PMH:    Past Medical History:   Diagnosis Date    Acute kidney failure ([place]) 03/31/2017    Cancer     Depression     Fibromyalgia     Hypothyroidism        Medications:    Current Facility-Administered Medications   Medication Dose Route Frequency    sodium chloride 0.9% IV  3 mL/hr Intravenous Continuous    enoxaparin (LOVENOX) injection 80 mg  1 mg/kg Subcutaneous Q12H    [START ON 06/08/2017] polyethylene glycol (GLYCOLAX,MIRALAX) powder 17 g  17 g Per G Tube Daily    ertapenem (INVANZ) IV 1,000 mg  1,000 mg Intravenous Q24H    DAPTOmycin (CUBICIN) 500 mg in sodium chloride 0.9% 68 mL IVPB  8 mg/kg (Adjusted) Intravenous Q24H    sodium chloride 0.9% IV  100 mL/hr Intravenous Continuous    ibuprofen (ADVIL,MOTRIN) tablet 600 mg  600 mg Oral Q6H PRN    insulin glargine (LANTUS) injection 4 Units  4 Units Subcutaneous Nightly    lidocaine (LIDODERM) 5 % patch 1 patch  1 patch  Transdermal Q24H    methadone (DOLOPHINE) tablet 5 mg  5 mg Oral Daily    PROSOURCE NO CARB liquid LIQD 30 mL  30 mL Oral Daily with breakfast    triamterene-hydrochlorothiazide (MAXZIDE-25) 37.5-25 MG per tablet 1 tablet  1 tablet Oral QAM    bacitracin zinc-polymyxin B sulfate (POLYSPORIN) ointment   Topical 2 times per day    lidocaine (LIDODERM) 5 % patch 1 patch  1 patch Transdermal Q24H    methadone (DOLOPHINE) tablet 7.5 mg  7.5 mg Oral BID    HYDROmorphone (DILAUDID) tablet 2 mg  2 mg Oral Q4H PRN    methylphenidate (RITALIN) tablet 2.5 mg  2.5 mg Oral BID    naloxone (NARCAN) 0.4 mg/mL injection 0.1 mg  0.1 mg Intravenous Q5 Min PRN    sodium chloride 0.9 % FLUSH REQUIRED IF PATIENT HAS IV  0-500 mL/hr Intravenous PRN    dextrose 5 % FLUSH REQUIRED IF PATIENT HAS IV  0-500 mL/hr Intravenous PRN    fluconazole (DIFLUCAN) tablet 800 mg  800 mg Oral Daily    pantoprazole (PROTONIX) EC tablet 40 mg  40 mg Oral BID AC    calcium  carbonate (TUMS) chewable tablet 1,000 mg  1,000 mg Oral BID PRN    senna (SENOKOT) tablet 2 tablet  2 tablet Oral 2 times per day    bisacodyl (DULCOLAX) suppository 10 mg  10 mg Rectal Daily    DULoxetine (CYMBALTA) DR capsule 20 mg  20 mg Oral Daily    metoclopramide (REGLAN) tablet 10 mg  10 mg Oral 4x Daily AC & HS    docusate sodium (COLACE) capsule 200 mg  200 mg Oral 2 times per day    acetaminophen (TYLENOL) tablet 1,000 mg  1,000 mg Oral Q8H    heparin lock flush injection 50 Units  50 Units Intracatheter PRN    ondansetron (ZOFRAN) injection 4 mg  4 mg Intravenous Q6H PRN    levothyroxine (SYNTHROID, LEVOTHROID) tablet 137 mcg  137 mcg Oral Daily     Facility-Administered Medications Ordered in Other Encounters   Medication Dose Route Frequency    etomidate (AMIDATE) 2 mg/mL injection    PRN    rocuronium (ZEMURON) 10 mg/mL injection   Intravenous PRN       Abnormal Labs:    Recent Results (from the past 72 hour(s))   POCT glucose    Collection Time:  06/04/17 10:13 PM   Result Value Ref Range    Glucose POCT 90 60 - 99 mg/dL   CK    Collection Time: 06/05/17 12:44 AM   Result Value Ref Range    CK 76 26 - 192 U/L   EKG 12 lead    Collection Time: 06/05/17  2:44 PM   Result Value Ref Range    Rate 117 bpm    PR 172 ms    P 19 deg    QRSD 74 ms    QT 284 ms    QTc 397 ms    QRS 10 deg    T 234 deg    Severity - ABNORMAL ECG -     Statement SINUS TACHYCARDIA     Statement ATRIAL PREMATURE COMPLEX     Statement BORDERLINE R WAVE PROGRESSION, ANTERIOR LEADS     Statement NONSPECIFIC T ABNORMALITIES, INFERIOR LEADS    POCT glucose    Collection Time: 06/05/17  9:38 PM   Result Value Ref Range    Glucose POCT 150 (H) 60 - 99 mg/dL   EKG 12 lead    Collection Time: 06/05/17 10:05 PM   Result Value Ref Range    Rate 120 bpm    PR 164 ms    P 57 deg    QRSD 80 ms    QT 292 ms    QTc 413 ms    QRS 40 deg    T 71 deg    Severity - ABNORMAL ECG -     Statement SINUS TACHYCARDIA     Statement NONSPECIFIC T ABNORMALITIES, INFERIOR LEADS    CBC and differential    Collection Time: 06/05/17 11:05 PM   Result Value Ref Range    WBC 10.5 (H) 4.0 - 10.0 THOU/uL    RBC 2.6 (L) 3.9 - 5.2 MIL/uL    Hemoglobin 7.3 (L) 11.2 - 15.7 g/dL    Hematocrit 24 (L) 34 - 45 %    MCV 90 79 - 95 fL    MCH 28 26 - 32 pg/cell    MCHC 31 (L) 32 - 36 g/dL    RDW 17.2 (H) 11.7 - 14.4 %    Platelets 573 (H) 160 - 370 THOU/uL  Seg Neut % 82.3 %    Lymphocyte % 7.8 %    Monocyte % 7.5 %    Eosinophil % 0.8 %    Basophil % 0.7 %    Neut # K/uL 8.7 (H) 1.6 - 6.1 THOU/uL    Lymph # K/uL 0.8 (L) 1.2 - 3.7 THOU/uL    Mono # K/uL 0.8 0.2 - 0.9 THOU/uL    Eos # K/uL 0.1 0.0 - 0.4 THOU/uL    Baso # K/uL 0.1 0.0 - 0.1 THOU/uL    Nucl RBC % 0.0 0.0 - 0.2 /100 WBC    Nucl RBC # K/uL 0.0 0.0 - 0.0 THOU/uL    IMM Granulocytes # 0.1 THOU/uL    IMM Granulocytes 0.9 %   Comprehensive metabolic panel    Collection Time: 06/05/17 11:05 PM   Result Value Ref Range    Sodium 131 (L) 133 - 145 mmol/L    Potassium 4.8 3.3 -  5.1 mmol/L    Chloride 92 (L) 96 - 108 mmol/L    CO2 23 20 - 28 mmol/L    Anion Gap 16 7 - 16    UN 21 (H) 6 - 20 mg/dL    Creatinine 0.85 0.51 - 0.95 mg/dL    GFR,Caucasian 69 *    GFR,Black 80 *    Glucose 112 (H) 60 - 99 mg/dL    Calcium 8.8 8.6 - 10.2 mg/dL    Total Protein 7.7 6.3 - 7.7 g/dL    Albumin 2.6 (L) 3.5 - 5.2 g/dL    Bilirubin,Total <0.2 0.0 - 1.2 mg/dL    AST 68 (H) 0 - 35 U/L    ALT 49 (H) 0 - 35 U/L    Alk Phos 535 (H) 35 - 105 U/L   *Lactic acid, plasma    Collection Time: 06/05/17 11:05 PM   Result Value Ref Range    Lactate 2.3 (H) 0.5 - 2.2 mmol/L   CBC and differential    Collection Time: 06/06/17  1:14 AM   Result Value Ref Range    WBC 9.5 4.0 - 10.0 THOU/uL    RBC 2.4 (L) 3.9 - 5.2 MIL/uL    Hemoglobin 6.9 (L) 11.2 - 15.7 g/dL    Hematocrit 22 (L) 34 - 45 %    MCV 90 79 - 95 fL    MCH 28 26 - 32 pg/cell    MCHC 32 32 - 36 g/dL    RDW 17.2 (H) 11.7 - 14.4 %    Platelets 542 (H) 160 - 370 THOU/uL    Seg Neut % 82.2 %    Lymphocyte % 8.9 %    Monocyte % 7.0 %    Eosinophil % 0.4 %    Basophil % 0.7 %    Neut # K/uL 7.8 (H) 1.6 - 6.1 THOU/uL    Lymph # K/uL 0.9 (L) 1.2 - 3.7 THOU/uL    Mono # K/uL 0.7 0.2 - 0.9 THOU/uL    Eos # K/uL 0.0 0.0 - 0.4 THOU/uL    Baso # K/uL 0.1 0.0 - 0.1 THOU/uL    Nucl RBC % 0.1 0.0 - 0.2 /100 WBC    Nucl RBC # K/uL 0.0 0.0 - 0.0 THOU/uL    IMM Granulocytes # 0.1 THOU/uL    IMM Granulocytes 0.8 %   Comprehensive metabolic panel    Collection Time: 06/06/17  1:14 AM   Result Value Ref Range    Sodium 126 (L) 133 -  145 mmol/L    Potassium 4.7 3.3 - 5.1 mmol/L    Chloride 89 (L) 96 - 108 mmol/L    CO2 25 20 - 28 mmol/L    Anion Gap 12 7 - 16    UN 22 (H) 6 - 20 mg/dL    Creatinine 0.82 0.51 - 0.95 mg/dL    GFR,Caucasian 72 *    GFR,Black 83 *    Glucose 98 60 - 99 mg/dL    Calcium 8.8 8.6 - 10.2 mg/dL    Total Protein 6.8 6.3 - 7.7 g/dL    Albumin 2.4 (L) 3.5 - 5.2 g/dL    Bilirubin,Total <0.2 0.0 - 1.2 mg/dL    AST 69 (H) 0 - 35 U/L    ALT 47 (H) 0 - 35 U/L    Alk  Phos 489 (H) 35 - 105 U/L     *Note: Due to a large number of results and/or encounters for the requested time period, some results have not been displayed. A complete set of results can be found in Results Review.

## 2017-06-07 NOTE — Plan of Care (Signed)
Problem: Safety  Goal: Patient will remain free of falls  Outcome: Progressing towards goal      Problem: Pain/Comfort  Goal: Patient's pain or discomfort is manageable  Outcome: Goal not met      Problem: Mobility  Goal: Functional status is maintained or improved - Geriatric  Outcome: Progressing towards goal    Goal: Patient's functional status is maintained or improved  Outcome: Progressing towards goal      Problem: Nutrition  Goal: Patient's nutritional status is maintained or improved  Outcome: Progressing towards goal    Goal: Nutritional status is maintained or improved - Geriatric  Outcome: Progressing towards goal      Problem: Bowel Elimination  Goal: Elimination patterns are normal or improving  Outcome: Progressing towards goal      Problem: Psychosocial  Goal: Demonstrates ability to cope with illness  Outcome: Progressing towards goal

## 2017-06-07 NOTE — Progress Notes (Signed)
Pt off floor this am via stretcher to IR for thoracentesis, returned 1030am, VSS, 1 unit PRBC infusing for HCT 22, chest pigtail to -20cm water seal upon return from IR, awaiting pigtail orders from providers.  Report given to oncoming RN.

## 2017-06-07 NOTE — H&P (View-Only) (Signed)
Interventional Radiology H&P    HPI : Amanda Galloway is a 72 y.o. female with a hx of pancreatic adenocarcinoma s/p whipple and a left sided pleural effusion who presents for a left sided thoracentesis and possible left sided chest tube placement.    History:    Past Medical History:   Diagnosis Date    Acute kidney failure ([place]) 03/31/2017    Cancer     Depression     Fibromyalgia     Hypothyroidism       Past Surgical History:   Procedure Laterality Date    CHOLECYSTECTOMY      CHOLECYSTECTOMY, LAPAROSCOPIC  09/06/2016    HYSTERECTOMY  08/1986    Fibroids    KNEE SURGERY Right     PR INSERT TUNNELED CV CATH WITH PORT Right 10/04/2016    Procedure: Right IJ MEDIPORT Insertion;  Surgeon: Delano Metz, MD;  Location: [facility] MAIN OR;  Service: Oncology General    PR LAP,DIAGNOSTIC ABDOMEN N/A 10/04/2016    Procedure: LAPAROSCOPY DIAGNOSTIC;  Surgeon: Delano Metz, MD;  Location: [facility] MAIN OR;  Service: Oncology General    PR PART [place] PANC,PROX+REMV DUOD+ANAST N/A 03/29/2017    Procedure: WHIPPLE PROCEDURE;  Surgeon: Delano Metz, MD;  Location: [facility] MAIN OR;  Service: Oncology General    ROTATOR CUFF REPAIR Right     TONSILLECTOMY AND ADENOIDECTOMY        Prescriptions Prior to Admission   Medication Sig    methadone (DOLOPHINE) 5 MG tablet Take 15 mg by mouth nightly    LORazepam (ATIVAN) 0.5 MG tablet Take 1 tablet (0.5 mg total) by mouth 2 times daily as needed for Anxiety   Max daily dose: 1 mg (Patient taking differently: Take 0.5 mg by mouth 2 times daily as needed for Anxiety   0.5mg  nightly and prn)    methadone (DOLOPHINE) 5 MG tablet Take 20mg  in the morning, 20mg  in afternoon and 15mg  at night. (Patient taking differently: 10 mg 2 times daily   Take 20mg  in the morning, 20mg  in afternoon and 15mg  at night.)    enoxaparin (LOVENOX) 100 mg/mL injection Inject 1 Syringe (100 mg total) into the skin daily (Patient taking differently: Inject 100 mg into the skin 2 times daily )    potassium chloride SA  (KLOR-CON M20) 20 mEq  tablet Take 20 mEq by mouth 2 times daily    famotidine (PEPCID) 20 MG tablet Take 1 tablet (20 mg total) by mouth 2 times daily    escitalopram (LEXAPRO) 20 MG tablet Take 1 tablet (20 mg total) by mouth daily (Patient taking differently: Take 20 mg by mouth nightly   )    amitriptyline (ELAVIL) 25 MG tablet Take 1 tablet (25 mg total) by mouth nightly    dexamethasone (DECADRON) 4 MG tablet Take 1 tablet (4 mg total) by mouth 2 times daily (Patient taking differently: Take 4 mg by mouth daily (with breakfast)   )    docusate sodium (COLACE) 100 MG capsule Take 2 capsules (200 mg total) by mouth 2 times daily    metoclopramide (REGLAN) 5 MG tablet Take 1 tablet (5 mg total) by mouth 3 times daily (before meals)    pantoprazole (PROTONIX) 40 MG EC tablet Take 1 tablet (40 mg total) by mouth 2 times daily (before meals)   Swallow whole. Do not crush, break, or chew.    senna (SENOKOT) 8.6 MG tablet Take 2 tablets by mouth 2 times daily    PREMARIN 0.625  MG tablet Take 0.625 mg by mouth every morning       ondansetron (ZOFRAN-ODT) 4 MG disintegrating tablet Take 1 tablet (4 mg total) by mouth 3 times daily as needed for Nausea   Place on top of tongue.    levothyroxine (SYNTHROID, LEVOTHROID) 137 MCG tablet Take 1 tablet (137 mcg total) by mouth daily (before breakfast)    Vitamins - senior (CENTRUM SILVER) TABS Take 1 tablet by mouth daily (Patient taking differently: Take 1 tablet by mouth every morning   )    triamterene-hydrochlorothiazide (MAXZIDE-25) 37.5-25 MG per tablet Take 1 tablet by mouth daily (Patient taking differently: Take 1 tablet by mouth every morning   )    fluorouracil (ADRUCIL) 50 MG/ML injection Administer 3,477 mg into the vein every 14 days   Start in infusion center.  Infuse over 46 hours at home.    pegfilgrastim (NEULASTA) 6 MG/0.6ML injection syringe Inject 0.6 mLs (6 mg total) into the skin once    Meds Exist in Therapy Plan Peg-Filgrastim     heparin 100 UNIT/ML injection 5 mLs (500 Units total) by Intracatheter route as needed (for line care)    sodium chloride 0.9 % flush 10 mLs by Intracatheter route as needed (for line care)     Current Medications:    ertapenem  1,000 mg Intravenous Q24H    daptomycin IV  8 mg/kg (Adjusted) Intravenous Q24H    insulin glargine  4 Units Subcutaneous Nightly    lidocaine  1 patch Transdermal Q24H    methadone  5 mg Oral Daily    PROSOURCE NO CARB  30 mL Oral Daily with breakfast    triamterene-hydrochlorothiazide  1 tablet Oral QAM    bacitracin zinc-polymyxin B sulfate   Topical 2 times per day    lidocaine  1 patch Transdermal Q24H    methadone  7.5 mg Oral BID    polyethylene glycol  17 g Oral Daily    methylphenidate  2.5 mg Oral BID    fluconazole  800 mg Oral Daily    pantoprazole  40 mg Oral BID AC    senna  2 tablet Oral 2 times per day    bisacodyl  10 mg Rectal Daily    enoxaparin  40 mg Subcutaneous Daily    DULoxetine  20 mg Oral Daily    metoclopramide  10 mg Oral 4x Daily AC & HS    docusate sodium  200 mg Oral 2 times per day    acetaminophen  1,000 mg Oral Q8H    levothyroxine  137 mcg Oral Daily     No Known Allergies (drug, envir, food or latex)   Social History   Substance Use Topics    Smoking status: Never Smoker    Smokeless tobacco: Never Used    Alcohol use No      Family History   Problem Relation Age of Onset    Breast cancer Mother         died 20    Cancer Father         NHL, died 2    Heart Disease Father     Diabetes Sister     Obesity Sister         4 siblings        Objective:     BP: (120-138)/(60-80)   Temp:  [36.2 C (97.2 F)-37.1 C (98.8 F)]   Temp src: Temporal (04/05 0834)  Heart Rate:  [94-109]   Resp:  [  16-18]   SpO2:  [91 %-99 %]     No intake/output data recorded.      Physical Exam:    General: NAD  HEENT:NCAT  Cardiac: RRR, S1S2, no M/G/R  Lungs: Decreased left basilar breath sounds  Abdomen: soft, nontender, nondistended, normal bowel  sounds  Extremities: no LEE, good distal pulses  Neuro: grossly intact    Labs:  Recent Results (from the past 24 hour(s))   POCT glucose    Collection Time: 06/06/17 10:12 PM   Result Value    Glucose POCT 82   C reactive protein    Collection Time: 06/07/17 12:31 AM   Result Value    CRP 186 (H)   Sedimentation rate, automated    Collection Time: 06/07/17 12:31 AM   Result Value    Sedimentation Rate 35 (H)   CBC and differential    Collection Time: 06/07/17 12:31 AM   Result Value    WBC 6.8    RBC 2.3 (L)    Hemoglobin 6.6 (L)    Hematocrit 22 (L)    MCV 93    MCH 29    MCHC 31 (L)    RDW 17.6 (H)    Platelets 493 (H)    Seg Neut % 79.2    Lymphocyte % 10.4    Monocyte % 8.7    Eosinophil % 0.4    Basophil % 0.7    Neut # K/uL 5.4    Lymph # K/uL 0.7 (L)    Mono # K/uL 0.6    Eos # K/uL 0.0    Baso # K/uL 0.1    Nucl RBC % 0.1    Nucl RBC # K/uL 0.0    IMM Granulocytes # 0.0    IMM Granulocytes 0.6   Basic metabolic panel    Collection Time: 06/07/17 12:31 AM   Result Value    Glucose 101 (H)    Sodium 131 (L)    Potassium 4.0    Chloride 94 (L)    CO2 20    Anion Gap 17 (H)    UN 17    Creatinine 0.81    GFR,Caucasian 73    GFR,Black 85    Calcium 8.1 (L)   Red blood cells    Collection Time: 06/07/17  7:03 AM   Result Value    Blood product type Red Blood Cells    Unit Number [id number]    Dispense status Ready    Product code [id number]    Component blood type A Neg    Coding system [id number]    Coded Blood type 0600   Type and screen    Collection Time: 06/07/17  7:50 AM   Result Value    ABO RH Blood Type A RH NEG    Antibody Screen Negative           Imaging:      Assessment and Plan:    72 y.o. female with a hx of pancreatic adenocarcinoma s/p whipple and a left sided pleural effusion presents for a left sided thoracentesis and possible left sided chest tube placement.    - NPO  -   - Procedure, risks and benefits discussed.     Maryanna Shape, MD   Interventional Radiology     06/07/2017 at 9:09  AM

## 2017-06-07 NOTE — Progress Notes (Signed)
Brief ID note:     - Chart reviewed, afebrile, HDS, no leukocytosis. Underwent L-sided chest tube placement for the L pleural effusion today. Fluid analysis suggest exudative fluid, and there are 1604 nucleated cells. Cultures sent and are pending.    - Both Blood culture sets from 06/06/17 - from the periphery and mediport turned positive for VRE within 18.2 hrs (mediport) and 17.3 hrs (periphery). This is not very surprising given that the patient has had ongoing unresolved abdominal abscesses that had grown VRE recently, and since 3/27 has been on no VRE coverage.        Recommendations:  - Please send 1 sets of BCx each  from the periphery and the mediport. The time to positivity of the cultures is not suggestive of the a CRBSI at the moment, but will need to be mindful of results from repeat cultures going forward in deciding whether or not the mediport might need to come out in the future (if feasible in the overall context of the patient condition).   - Continue ertapenem 1gm daily and daptomycin at 8mg /kg. Continue fluconazole 800mg  daily.   - Twice weekly liver chemistry while on the fluconazole.       ID will follow.     D/W with Dr. Chucky May, MD  Infectious Diseases fellow, PGY-4  Pager [phone]

## 2017-06-08 ENCOUNTER — Inpatient Hospital Stay: Payer: Medicare (Managed Care)

## 2017-06-08 LAB — BASIC METABOLIC PANEL
Anion Gap: 15 (ref 7–16)
CO2: 22 mmol/L (ref 20–28)
Calcium: 8.2 mg/dL — ABNORMAL LOW (ref 8.6–10.2)
Chloride: 95 mmol/L — ABNORMAL LOW (ref 96–108)
Creatinine: 0.73 mg/dL (ref 0.51–0.95)
GFR,Black: 96 *
GFR,Caucasian: 83 *
Glucose: 119 mg/dL — ABNORMAL HIGH (ref 60–99)
Lab: 15 mg/dL (ref 6–20)
Potassium: 4.1 mmol/L (ref 3.3–5.1)
Sodium: 132 mmol/L — ABNORMAL LOW (ref 133–145)

## 2017-06-08 LAB — CBC AND DIFFERENTIAL
Baso # K/uL: 0.1 10*3/uL (ref 0.0–0.1)
Basophil %: 0.7 %
Eos # K/uL: 0.1 10*3/uL (ref 0.0–0.4)
Eosinophil %: 1.6 %
Hematocrit: 25 % — ABNORMAL LOW (ref 34–45)
Hemoglobin: 8.1 g/dL — ABNORMAL LOW (ref 11.2–15.7)
IMM Granulocytes #: 0.1 10*3/uL
IMM Granulocytes: 0.8 %
Lymph # K/uL: 1 10*3/uL — ABNORMAL LOW (ref 1.2–3.7)
Lymphocyte %: 11.6 %
MCH: 29 pg/cell (ref 26–32)
MCHC: 32 g/dL (ref 32–36)
MCV: 89 fL (ref 79–95)
Mono # K/uL: 0.8 10*3/uL (ref 0.2–0.9)
Monocyte %: 9 %
Neut # K/uL: 6.4 10*3/uL — ABNORMAL HIGH (ref 1.6–6.1)
Nucl RBC # K/uL: 0 10*3/uL (ref 0.0–0.0)
Nucl RBC %: 0 /100 WBC (ref 0.0–0.2)
Platelets: 508 10*3/uL — ABNORMAL HIGH (ref 160–370)
RBC: 2.8 MIL/uL — ABNORMAL LOW (ref 3.9–5.2)
RDW: 16.8 % — ABNORMAL HIGH (ref 11.7–14.4)
Seg Neut %: 76.3 %
WBC: 8.3 10*3/uL (ref 4.0–10.0)

## 2017-06-08 LAB — RED BLOOD CELLS
Coded Blood type: 600
Component blood type: A NEG
Dispense status: TRANSFUSED

## 2017-06-08 LAB — FUNGUS CULTURE

## 2017-06-08 LAB — GRAM STAIN: Gram Stain: 0

## 2017-06-08 LAB — AEROBIC CULTURE

## 2017-06-08 LAB — POCT GLUCOSE: Glucose POCT: 120 mg/dL — ABNORMAL HIGH (ref 60–99)

## 2017-06-08 LAB — ANAEROBIC CULTURE

## 2017-06-08 NOTE — Progress Notes (Addendum)
Surgical Oncology/Hepatobiliary Surgery Progress Note     LOS: 71 days     Subjective:  Interval Events:     NAEO. Pt remains afebrile. Tolerating TF's. Has not been OOB since L pigtail placement    (+) for Enterococcus, suspected to be previously-isolated VRE. Continues on IV Antibiotics (Ertapenem, Daptomycin, Fluconazole) per ID recommendations. IR pigtail placed 04/05 w/ exudative pleural fluid, cultures pending.    Objective:  Vitals Sign Ranges for Past 24 Hours:  BP: (100-130)/(53-82)   Temp:  [36.1 C (97 F)-37.2 C (99 F)]   Temp src: Temporal (04/06 0358)  Heart Rate:  [90-103]   Resp:  [14-22]   SpO2:  [91 %-99 %]     Physical Exam:  General Appearance: in NAD  Cardiac: regular rate  Respiratory: non-labored breathing on room air  Abdomen: Soft, non tender to palpation, non distended, incision well healed   Extremities: warm, erythema of the bilateral anterior calves with small amount of skin breakdown, minimal edema no posterior calf tenderness   Drains: L-IR Pigtail in place w/ serous output  Labs:    CBC:    Recent Labs  Lab 06/07/17  2340 06/07/17  0031 06/06/17  0114 06/05/17  2305 06/03/17  0518   WBC 8.3 6.8 9.5 10.5* 10.2*   Hemoglobin 8.1* 6.6* 6.9* 7.3* 7.6*   Hematocrit 25* 22* 22* 24* 25*   Platelets 508* 493* 542* 573* 295*       Metabolic Panel:    Recent Labs  Lab 06/07/17  2340 06/07/17  0031 06/06/17  0114 06/05/17  2305 06/03/17  0518   Sodium 132* 131* 126* 131* 135   Potassium 4.1 4.0 4.7 4.8 4.7   Chloride 95* 94* 89* 92* 96   CO2 _0 UN 15 17 22* 21* 21*   Creatinine 0.73 0.81 0.82 0.85 0.74   Glucose 119* 101* 98 112* 125*   Calcium 8.2* 8.1* 8.8 8.8 9.3   Magnesium  --   --  1.7  --  1.8   Phosphorus  --   --  4.9*  --  5.8*          Assessment:  72 y.o. female with h/o recent PE and pancreatic cancer POD # 20  status post whipple procedure complicated by delayed gastric emptying.  NGT replaced on 04/04/17. UGI on 04/12/17 concerning for obstruction just beyond the [place]  in the efferent limb.  Course complicated on 2/84/13 by rising LFTs and sepsis with CT concerning for cholangitis given biliary tree obstruction and PV compression due to hematoma and afferent limb distention in setting of PE treatment. Is s/p IR PTC on 04/16/17 and IR percutaneous aspiration of anterior abdominal fluid collection on 04/24/17. IR LUQ perc drain placement 3/1. IR anterior abdominal 92f perc drain placement 3/3. IR anterior perc drain into left hepatic collection 3/6.    Plan:  - Regular Diet with TF's. Unable to safely place PEG per Thoracic and GI recommendations. Will re-engage IR about poss percutaneous G-tube placement  - Plan to continue previous IV antibiotic regimen (Ertapenem and Daptomycin) per ID Recommendations.  - Hyponatremia: Resolving, D/c IVF today.  - Anemia; Hct 25 this AM following 1U pRBCs yesterday.  - Aggressive bowel regimen  - Biliary drain removed 3/26  - SCDs, Continue Therapeutic Lovenox.  - Dispo: Pending clinical course, adequate nutrition/PO intake. Pt has Physical Debilitation requiring a rehab setting. PT/OT recommending SNF.    JBelinda Fisher MD  06/08/2017     8:57 AM   General Surgery Resident        HPB and GI Surgery Attending:    I saw and evaluated the patient. I have reviewed and edited the resident's/fellow's note and confirm the findings and plan of care as documented above. Improving from septic flare. 2 small hepatic abscesses on imaging. ID consulting.    Hepatic abscess in setting of ampullary ablation post Whipple can be challenging to clear and will likely require a long course of antibiotics.    Plan short interval imaging. Follow CRP?    Tube thoracostomy for effusion, does not appear to be empyema.    Reconsult IR next week re: CT guided G-tube after reimaging.    Charolotte Eke, MD

## 2017-06-08 NOTE — Plan of Care (Signed)
Problem: Safety  Goal: Patient will remain free of falls  Outcome: Maintaining      Problem: Pain/Comfort  Goal: Patient's pain or discomfort is manageable  Outcome: Progressing towards goal      Problem: Mobility  Goal: Functional status is maintained or improved - Geriatric  Outcome: Progressing towards goal    Goal: Patient's functional status is maintained or improved  Outcome: Progressing towards goal      Problem: Nutrition  Goal: Patient's nutritional status is maintained or improved  Outcome: Progressing towards goal      Problem: Bowel Elimination  Goal: Elimination patterns are normal or improving  Outcome: Progressing towards goal      Problem: Post-Operative Hemodynamic Stability  Goal: Maintain Hemodynamic Stability  Outcome: Progressing towards goal      Problem: Fluid and Electrolyte Imbalance  Goal: Fluid and Electrolyte imbalance  Outcome: Progressing towards goal

## 2017-06-09 ENCOUNTER — Inpatient Hospital Stay: Payer: Medicare (Managed Care)

## 2017-06-09 LAB — CBC AND DIFFERENTIAL
Baso # K/uL: 0.1 10*3/uL (ref 0.0–0.1)
Basophil %: 0.6 %
Eos # K/uL: 0.6 10*3/uL — ABNORMAL HIGH (ref 0.0–0.4)
Eosinophil %: 7.2 %
Hematocrit: 25 % — ABNORMAL LOW (ref 34–45)
Hemoglobin: 7.5 g/dL — ABNORMAL LOW (ref 11.2–15.7)
IMM Granulocytes #: 0.1 10*3/uL
IMM Granulocytes: 1.2 %
Lymph # K/uL: 1.1 10*3/uL — ABNORMAL LOW (ref 1.2–3.7)
Lymphocyte %: 14 %
MCH: 28 pg/cell (ref 26–32)
MCHC: 30 g/dL — ABNORMAL LOW (ref 32–36)
MCV: 91 fL (ref 79–95)
Mono # K/uL: 0.7 10*3/uL (ref 0.2–0.9)
Monocyte %: 9.1 %
Neut # K/uL: 5.3 10*3/uL (ref 1.6–6.1)
Nucl RBC # K/uL: 0 10*3/uL (ref 0.0–0.0)
Nucl RBC %: 0 /100 WBC (ref 0.0–0.2)
Platelets: 448 10*3/uL — ABNORMAL HIGH (ref 160–370)
RBC: 2.7 MIL/uL — ABNORMAL LOW (ref 3.9–5.2)
RDW: 17.2 % — ABNORMAL HIGH (ref 11.7–14.4)
Seg Neut %: 67.9 %
WBC: 7.8 10*3/uL (ref 4.0–10.0)

## 2017-06-09 LAB — BLOOD CULTURE

## 2017-06-09 LAB — BASIC METABOLIC PANEL
Anion Gap: 13 (ref 7–16)
CO2: 23 mmol/L (ref 20–28)
Calcium: 8.5 mg/dL — ABNORMAL LOW (ref 8.6–10.2)
Chloride: 96 mmol/L (ref 96–108)
Creatinine: 0.59 mg/dL (ref 0.51–0.95)
GFR,Black: 107 *
GFR,Caucasian: 93 *
Glucose: 111 mg/dL — ABNORMAL HIGH (ref 60–99)
Lab: 16 mg/dL (ref 6–20)
Potassium: 4 mmol/L (ref 3.3–5.1)
Sodium: 132 mmol/L — ABNORMAL LOW (ref 133–145)

## 2017-06-09 LAB — CRP: CRP: 184 mg/L — ABNORMAL HIGH (ref 0–10)

## 2017-06-09 LAB — SEDIMENTATION RATE, AUTOMATED: Sedimentation Rate: 69 mm/hr — ABNORMAL HIGH (ref 0–30)

## 2017-06-09 LAB — POCT GLUCOSE: Glucose POCT: 97 mg/dL (ref 60–99)

## 2017-06-09 MED ORDER — METHYLPHENIDATE HCL 5 MG PO TABS *I*
2.5000 mg | ORAL_TABLET | Freq: Two times a day (BID) | ORAL | Status: DC
Start: 2017-06-09 — End: 2017-06-09

## 2017-06-09 MED ORDER — HYDROMORPHONE HCL 2 MG/ML IJ SOLN *WRAPPED*
0.2500 mg | Freq: Once | INTRAMUSCULAR | Status: AC
Start: 2017-06-09 — End: 2017-06-10
  Administered 2017-06-09: 0.26 mg via INTRAVENOUS

## 2017-06-09 MED ORDER — METHYLPHENIDATE HCL 5 MG PO TABS *I*
2.5000 mg | ORAL_TABLET | Freq: Two times a day (BID) | ORAL | Status: DC
Start: 2017-06-10 — End: 2017-06-27
  Administered 2017-06-10 – 2017-06-17 (×13): 2.5 mg via ORAL
  Filled 2017-06-09 (×14): qty 1

## 2017-06-09 MED ORDER — SODIUM CHLORIDE 0.9 % FLUSH FOR PUMPS *I*
0.0000 mL/h | INTRAVENOUS | Status: AC | PRN
Start: 2017-06-09 — End: 2017-08-08
  Administered 2017-06-17: 20 mL/h
  Administered 2017-06-17: 70 mL/h
  Administered 2017-06-17: 70 mL/h via INTRAVENOUS
  Administered 2017-06-17: 140 mL/h
  Administered 2017-06-19 (×2): 10 mL/h
  Administered 2017-06-19: 10 mL/h via INTRAVENOUS
  Administered 2017-06-19 – 2017-06-20 (×4): 10 mL/h
  Administered 2017-06-20: 60 mL/h
  Administered 2017-06-20 (×3): 10 mL/h
  Administered 2017-06-20: 120 mL/h
  Administered 2017-06-20: 10 mL/h
  Administered 2017-06-20: 10 mL/h via INTRAVENOUS
  Administered 2017-06-20 – 2017-06-26 (×7): 10 mL/h
  Administered 2017-06-26: 10 mL/h via INTRAVENOUS
  Administered 2017-06-26: 10 mL/h
  Administered 2017-06-26: 10 mL/h via INTRAVENOUS
  Administered 2017-06-26 – 2017-06-27 (×16): 10 mL/h
  Administered 2017-06-27: 10 mL/h via INTRAVENOUS
  Administered 2017-06-28 (×3): 10 mL/h
  Administered 2017-06-28: 10 mL/h via INTRAVENOUS
  Administered 2017-06-28: 10 mL/h
  Administered 2017-06-28: 10 mL/h via INTRAVENOUS
  Administered 2017-06-28 – 2017-06-30 (×28): 10 mL/h
  Administered 2017-07-04 (×19): 5 mL/h
  Administered 2017-07-04: 5 mL/h via INTRAVENOUS
  Administered 2017-07-04 (×6): 5 mL/h
  Administered 2017-07-05: 5 mL/h via INTRAVENOUS
  Administered 2017-07-05 (×3): 5 mL/h
  Administered 2017-07-09: 10 mL/h
  Administered 2017-07-09: 10 mL/h via INTRAVENOUS
  Administered 2017-07-09 – 2017-07-10 (×4): 10 mL/h
  Administered 2017-07-10: 10 mL/h via INTRAVENOUS
  Administered 2017-07-10 – 2017-07-11 (×8): 10 mL/h
  Administered 2017-07-11: 10 mL/h via INTRAVENOUS
  Administered 2017-07-11: 10 mL/h
  Administered 2017-07-12: 10 mL/h via INTRAVENOUS
  Administered 2017-07-14 (×2): 10 mL/h
  Administered 2017-07-14: 10 mL/h via INTRAVENOUS
  Administered 2017-07-14 – 2017-07-16 (×10): 10 mL/h
  Administered 2017-07-16: 10 mL/h via INTRAVENOUS
  Administered 2017-07-16 – 2017-07-18 (×5): 10 mL/h
  Administered 2017-07-18: 10 mL/h via INTRAVENOUS
  Administered 2017-07-18 – 2017-07-19 (×9): 10 mL/h
  Administered 2017-07-19: 10 mL/h via INTRAVENOUS
  Administered 2017-07-19: 10 mL/h
  Administered 2017-07-19: 10 mL/h via INTRAVENOUS
  Administered 2017-07-19 – 2017-07-24 (×8): 10 mL/h
  Administered 2017-07-24: 10 mL/h via INTRAVENOUS
  Administered 2017-07-25 – 2017-07-26 (×5): 10 mL/h
  Administered 2017-07-26: 10 mL/h via INTRAVENOUS
  Administered 2017-07-28 (×4): 10 mL/h
  Administered 2017-07-28: 10 mL/h via INTRAVENOUS
  Administered 2017-07-28: 10 mL/h
  Administered 2017-08-01: 220 mL/h
  Administered 2017-08-01: 220 mL/h via INTRAVENOUS
  Administered 2017-08-02 (×2): 3 mL/h
  Administered 2017-08-02: 3 mL/h via INTRAVENOUS
  Administered 2017-08-03 (×3): 20 mL/h
  Administered 2017-08-03: 20 mL/h via INTRAVENOUS
  Administered 2017-08-03 (×4): 20 mL/h
  Administered 2017-08-04: 220 mL/h
  Administered 2017-08-04 (×4): 20 mL/h
  Administered 2017-08-04: 220 mL/h via INTRAVENOUS
  Administered 2017-08-04 (×3): 20 mL/h
  Administered 2017-08-05: 10 mL/h
  Administered 2017-08-05: 10 mL/h via INTRAVENOUS
  Administered 2017-08-05: 10 mL/h
  Administered 2017-08-05: 10 mL/h via INTRAVENOUS
  Administered 2017-08-05: 10 mL/h
  Administered 2017-08-05: 20 mL/h
  Administered 2017-08-05 (×2): 10 mL/h
  Administered 2017-08-05: 220 mL/h via INTRAVENOUS
  Administered 2017-08-05 – 2017-08-06 (×2): 10 mL/h
  Administered 2017-08-06: 100 mL/h
  Administered 2017-08-06: 10 mL/h via INTRAVENOUS
  Administered 2017-08-06 (×6): 10 mL/h

## 2017-06-09 MED ORDER — DEXTROSE 5 % FLUSH FOR PUMPS *I*
0.0000 mL/h | INTRAVENOUS | Status: AC | PRN
Start: 2017-06-09 — End: 2017-08-08

## 2017-06-09 MED ORDER — HYDROMORPHONE HCL 2 MG/ML IJ SOLN *WRAPPED*
INTRAMUSCULAR | Status: DC
Start: 2017-06-09 — End: 2017-06-10
  Filled 2017-06-09: qty 1

## 2017-06-09 MED ORDER — SODIUM CHLORIDE 0.9 % IV SOLN WRAPPED *I*
8.0000 mg/kg | INTRAVENOUS | Status: AC
Start: 2017-06-09 — End: 2017-06-12
  Administered 2017-06-09 – 2017-06-12 (×4): 600 mg via INTRAVENOUS
  Filled 2017-06-09 (×4): qty 12

## 2017-06-09 NOTE — Progress Notes (Addendum)
Surgical Oncology/Hepatobiliary Surgery Progress Note     LOS: 72 days     Subjective:  Interval Events:   NAEO. Pt continues to be afebrile. Tolerating TFs at goal. IR pigtail placed on 04/05 w/ exudative pleural fluid, output significantly decreased over the past 24hrs, 150cc (850cc). Pt complaining of chest wall pain associated with chest tube site. Otherwise noted to be doing well. Blood cultures and pleural fluid cultures are still pending.    Objective:  Vitals Sign Ranges for Past 24 Hours:  BP: (110-130)/(60-75)   Temp:  [35.9 C (96.6 F)-36.8 C (98.2 F)]   Temp src: Temporal (04/07 0354)  Heart Rate:  [91-103]   Resp:  [16]   SpO2:  [94 %-96 %]     Physical Exam:  General Appearance: in NAD  Cardiac: regular rate  Respiratory: non-labored breathing on room air  Abdomen: Soft, non tender to palpation, non distended, incision well healed   Extremities: warm, erythema of the bilateral anterior calves with small amount of skin breakdown, minimal edema no posterior calf tenderness   Drains: L-IR Pigtail in place w/ serous output  Labs:    CBC:    Recent Labs  Lab 06/09/17  0401 06/07/17  2340 06/07/17  0031 06/06/17  0114 06/05/17  2305 06/03/17  0518   WBC 7.8 8.3 6.8 9.5 10.5* 10.2*   Hemoglobin 7.5* 8.1* 6.6* 6.9* 7.3* 7.6*   Hematocrit 25* 25* 22* 22* 24* 25*   Platelets 448* 508* 493* 542* 573* 034*       Metabolic Panel:    Recent Labs  Lab 06/09/17  0401 06/07/17  2340 06/07/17  0031 06/06/17  0114 06/05/17  2305 06/03/17  0518   Sodium 132* 132* 131* 126* 131* 135   Potassium 4.0 4.1 4.0 4.7 4.8 4.7   Chloride 96 95* 94* 89* 92* 96   CO2 _0 UN _1 22* 21* 21*   Creatinine 0.59 0.73 0.81 0.82 0.85 0.74   Glucose 111* 119* 101* 98 112* 125*   Calcium 8.5* 8.2* 8.1* 8.8 8.8 9.3   Magnesium  --   --   --  1.7  --  1.8   Phosphorus  --   --   --  4.9*  --  5.8*          Assessment:  72 y.o. female with h/o recent PE and pancreatic cancer POD # 72  status post whipple procedure  complicated by delayed gastric emptying.  NGT replaced on 04/04/17. UGI on 04/12/17 concerning for obstruction just beyond the [place] in the efferent limb.  Course complicated on 7/42/59 by rising LFTs and sepsis with CT concerning for cholangitis given biliary tree obstruction and PV compression due to hematoma and afferent limb distention in setting of PE treatment. Is s/p IR PTC on 04/16/17 and IR percutaneous aspiration of anterior abdominal fluid collection on 04/24/17. IR LUQ perc drain placement 3/1. IR anterior abdominal 27f perc drain placement 3/3. IR anterior perc drain into left hepatic collection 3/6.    Plan:  - Regular Diet with TF's. Unable to safely place PEG per Thoracic and GI recommendations. Will re-engage IR about poss percutaneous G-tube placement  - Plan to continue previous IV antibiotic regimen (Ertapenem and Daptomycin) per ID Recommendations. May need PICC line for long-term antibiotics. Plan to repeat CT Scan this week to reassess collections.  - Hyponatremia: Stable from previous.  - Anemia; stable.  - Aggressive bowel  regimen  - Biliary drain removed 3/26  - SCDs, Continue Therapeutic Lovenox.  - Dispo: Pending clinical course, adequate nutrition/PO intake. Pt has Physical Debilitation requiring a rehab setting. PT/OT recommending SNF.    Bishop Dublin, MD  06/09/2017     6:05 AM   General Surgery Resident    HPB and GI Surgery Attending:    I saw and evaluated the patient. I have reviewed and edited the resident's/fellow's note and confirm the findings and plan of care as documented above. Main complaint is pain at chest tube site. Since nil output, will remove.    Charolotte Eke, MD

## 2017-06-09 NOTE — Progress Notes (Signed)
Chest Tube Removal Note    Amanda Galloway's chest tube was removed at the bedside per plan.  Appropriate sanitation precautions were applied. The procedure was explained to the patient.  The chest tube was removed following deep inspiration with valsalva maneuver and an occlusive dressing was applied. The patient tolerated the procedure well without evidence of pneumothorax following tube removal. The patient was instructed to call provider if they develop symptoms of shortness of breath or chest pain.    A post-pull Chest Xray was ordered and will be reviewed by the team as per plan.    Author: Bishop Dublin, MD as of: 06/09/2017  at: 2:21 PM

## 2017-06-10 ENCOUNTER — Encounter: Payer: Self-pay | Admitting: Anesthesiology

## 2017-06-10 ENCOUNTER — Other Ambulatory Visit: Payer: Self-pay | Admitting: Cardiology

## 2017-06-10 ENCOUNTER — Inpatient Hospital Stay: Payer: Medicare (Managed Care)

## 2017-06-10 LAB — CBC AND DIFFERENTIAL
Baso # K/uL: 0.1 10*3/uL (ref 0.0–0.1)
Basophil %: 0.7 %
Eos # K/uL: 0.5 10*3/uL — ABNORMAL HIGH (ref 0.0–0.4)
Eosinophil %: 5.3 %
Hematocrit: 24 % — ABNORMAL LOW (ref 34–45)
Hemoglobin: 7.7 g/dL — ABNORMAL LOW (ref 11.2–15.7)
IMM Granulocytes #: 0.1 10*3/uL
IMM Granulocytes: 1.3 %
Lymph # K/uL: 0.9 10*3/uL — ABNORMAL LOW (ref 1.2–3.7)
Lymphocyte %: 10.9 %
MCH: 28 pg/cell (ref 26–32)
MCHC: 32 g/dL (ref 32–36)
MCV: 89 fL (ref 79–95)
Mono # K/uL: 0.7 10*3/uL (ref 0.2–0.9)
Monocyte %: 8.4 %
Neut # K/uL: 6.2 10*3/uL — ABNORMAL HIGH (ref 1.6–6.1)
Nucl RBC # K/uL: 0 10*3/uL (ref 0.0–0.0)
Nucl RBC %: 0 /100 WBC (ref 0.0–0.2)
Platelets: 488 10*3/uL — ABNORMAL HIGH (ref 160–370)
RBC: 2.7 MIL/uL — ABNORMAL LOW (ref 3.9–5.2)
RDW: 17 % — ABNORMAL HIGH (ref 11.7–14.4)
Seg Neut %: 73.4 %
WBC: 8.4 10*3/uL (ref 4.0–10.0)

## 2017-06-10 LAB — COMPREHENSIVE METABOLIC PANEL
ALT: 57 U/L — ABNORMAL HIGH (ref 0–35)
AST: 91 U/L — ABNORMAL HIGH (ref 0–35)
Albumin: 2.2 g/dL — ABNORMAL LOW (ref 3.5–5.2)
Alk Phos: 407 U/L — ABNORMAL HIGH (ref 35–105)
Anion Gap: 14 (ref 7–16)
Bilirubin,Total: <0.2 mg/dL (ref 0.0–1.2)
CO2: 21 mmol/L (ref 20–28)
Calcium: 8.2 mg/dL — ABNORMAL LOW (ref 8.6–10.2)
Chloride: 96 mmol/L (ref 96–108)
Creatinine: 0.57 mg/dL (ref 0.51–0.95)
GFR,Black: 108 *
GFR,Caucasian: 94 *
Glucose: 105 mg/dL — ABNORMAL HIGH (ref 60–99)
Lab: 15 mg/dL (ref 6–20)
Potassium: 4 mmol/L (ref 3.3–5.1)
Sodium: 131 mmol/L — ABNORMAL LOW (ref 133–145)
Total Protein: 6.3 g/dL (ref 6.3–7.7)

## 2017-06-10 LAB — BASIC METABOLIC PANEL
Anion Gap: 17 — ABNORMAL HIGH (ref 7–16)
CO2: 20 mmol/L (ref 20–28)
Calcium: 8 mg/dL — ABNORMAL LOW (ref 8.6–10.2)
Chloride: 95 mmol/L — ABNORMAL LOW (ref 96–108)
Creatinine: 0.57 mg/dL (ref 0.51–0.95)
GFR,Black: 108 *
GFR,Caucasian: 94 *
Glucose: 107 mg/dL — ABNORMAL HIGH (ref 60–99)
Lab: 14 mg/dL (ref 6–20)
Potassium: 4 mmol/L (ref 3.3–5.1)
Sodium: 132 mmol/L — ABNORMAL LOW (ref 133–145)

## 2017-06-10 LAB — TRIGLYCERIDES: Triglycerides: 212 mg/dL — AB

## 2017-06-10 LAB — PROTIME-INR
INR: 1.4 — ABNORMAL HIGH (ref 0.9–1.1)
Protime: 15.5 s — ABNORMAL HIGH (ref 10.0–12.9)

## 2017-06-10 LAB — PREALBUMIN: Prealbumin: 13 mg/dL — ABNORMAL LOW (ref 20–40)

## 2017-06-10 LAB — BLOOD CULTURE

## 2017-06-10 LAB — ANAEROBIC CULTURE

## 2017-06-10 LAB — POCT GLUCOSE: Glucose POCT: 108 mg/dL — ABNORMAL HIGH (ref 60–99)

## 2017-06-10 LAB — MAGNESIUM: Magnesium: 1.5 mg/dL — ABNORMAL LOW (ref 1.6–2.5)

## 2017-06-10 LAB — PHOSPHORUS: Phosphorus: 4.3 mg/dL (ref 2.7–4.5)

## 2017-06-10 MED ORDER — LIDOCAINE HCL 1 % IJ SOLN *I*
INTRAMUSCULAR | Status: AC
Start: 2017-06-10 — End: 2017-06-10
  Filled 2017-06-10: qty 10

## 2017-06-10 MED ORDER — LIDOCAINE-EPINEPHRINE 1 %-1:100000 IJ SOLN *I*
10.0000 mL | Freq: Once | INTRAMUSCULAR | Status: AC
Start: 2017-06-10 — End: 2017-06-11
  Filled 2017-06-10: qty 10

## 2017-06-10 MED ORDER — HYDROMORPHONE HCL 2 MG/ML IJ SOLN *WRAPPED*
INTRAMUSCULAR | Status: AC
Start: 2017-06-10 — End: 2017-06-10
  Administered 2017-06-10: 0.26 mg via INTRAVENOUS
  Filled 2017-06-10: qty 1

## 2017-06-10 MED ORDER — MAGNESIUM SULFATE 2 GM IN 50 ML *WRAPPED*
2000.0000 mg | Freq: Once | INTRAVENOUS | Status: AC
Start: 2017-06-10 — End: 2017-06-10
  Administered 2017-06-10: 2000 mg via INTRAVENOUS
  Filled 2017-06-10: qty 50

## 2017-06-10 NOTE — Consults (Signed)
PICC PRE-PROCEDURE ASSESSMENT     Patient Vascular History/Contraindications: Other infected mediport, latest blood cultures 4/6 positive    Nephrology note required for PICC placement and provider contacted: No    Labs Reviewed:      Lab results: 06/10/17  0106   GFR,Caucasian 94   94   GFR,Black 108   108     No components found for: CREATININE      Lab results: 06/10/17  0106   WBC 8.4           Lab results: 06/10/17  0106   INR 1.4*           Lab results: 06/10/17  0106   Platelets 488*            Extremity Assessment:  WNL    Recommendations: PICC Line 24-48 hours after removal of port and blood cultures are negative    Refer to Radiology:   No    Name of Provider Contacted: [place] aware of above

## 2017-06-10 NOTE — Progress Notes (Signed)
06/10/17 1400   [facility] Patient Class Review   Patient Class Review Inpatient   Patient class effective as of 03/29/17    Ellery Plunk RN  Utilization Management  [phone]  Office: [phone]

## 2017-06-10 NOTE — Consults (Signed)
Medical Nutrition Therapy - Follow Up    Admit Date: 03/29/2017    Patient Summary: 72 y.o. female with h/o recent PE and pancreatic cancer status post whipple procedure complicated by delayed gastric emptying.  NGT replaced on 04/04/17. UGI on 04/12/17 concerning for obstruction just beyond the [place] in the efferent limb.  Course complicated on 0/62/69 by rising LFTs and sepsis with CT concerning for cholangitis given biliary tree obstruction and PV compression due to hematoma and afferent limb distention in setting of PE treatment. Is s/p IR PTC on 04/16/17 and IR percutaneous aspiration of anterior abdominal fluid collection on 04/24/17. IR LUQ perc drain placement 3/1. IR anterior abdominal 49f perc drain placement 3/3. IR anterior perc drain into left hepatic collection 3/6.    Pertinent Meds: levothyroxine, bowel regimen including daily suppository, Reglan, duloxetine, Protonix, diflucan, methadone, Maxzide, lantus, Ertapenem, IV NS @ 100 ml/hr, cubacin, Ritalin, Mg sulfate  Pertinent Labs: Na 132 (L); Cl 95 (L); Ca 9.44 (WNL corrected for albumin 2.2); a phos 407 (H)    Reviewed I/O's: 270 ml PO yesterday, BM x1     Enteral or parenteral access: NGT     Food allergies: NKFA    Current diet: regular  TF order: Osmolite 1.5 270 ml QID with FWF 30 ml before and after each feed  Supplements: none presently     Nutrition Focused Physical Exam:    Edema: +1 generalized, +2 BLE   Abdomen: rounded, tenderness/guarding, hypoactive BS, +flatus   Skin: erythema/redness, poor turgor, abrasion to L elbow, skin tear R elbow, skin tear to back, abrasion to R leg     Anthropometrics:  Height: 170.2 cm ('5\' 7"' )    Current Weight: 78.6 kg (173 lb 4.8 oz) (4/2); 109% IBW; 109% UBW  Ideal Body Weight: 72.4 kg + 10%  BMI: 27.1 kg/(m^2) overweight with edema noted   Weight Hx: weight fluctuation fluid related     06/04/2017 78.608 kg 173 lbs 5 oz Actual   05/28/2017 79.788 kg 175 lbs 14 oz Actual   05/13/2017 64.9 kg 143 lbs 1 oz --          Estimated Nutrient Needs: (Based on 64.9 kg) *BW with minimal edema    1630-1950 kcal/day (25-30 kcal/kg)   78-97 g protein/day (1.2-1.5 g/kg)    1630-1950 mL fluid/day (25-30 mL/kg)      Nutrition Assessment and Diagnosis:   New liver abscess, started on IV abx. Mediport cultures grew VRE, plan for new PICC placement.    Pt 1st eating meals and then bolus TFs after. Vit D adequate. Recent intakes have been mainly 25% of meals. Pt believes she has been eating better. Writer encouraged continued good po fluid intake- 2 cups of water at bedside. Poor turgor noted, may need an increase in FWF in po intake declines. TFs provide 1620 kcal, 68 g protein and 1061 ml free water daily.     Malnutrition Status: Pt diagnosed with moderate malnutrition 04/09/17.      Nutrition Intervention:   1. Continue TFs as ordered, recommend to increase FWF to 60 ml before and after each feed  - continue to adjust FWF prn based on po fluid intake and fluid status   2. Continue to monitor weekly weight, I/Os and % po intake- appreciate RN assistance   3. Continue to monitor BMP, Mg and PO4 and replete as needed  4. Continue regular diet, encourage PO intake     Nutrition Monitoring/Evaluation:   1. Will monitor  diet tolerance and intake, TF tolerance, nutrition-related labs, weight trend, BM pattern.    2. Will follow up per high nutrition risk protocol.    Laurence Aly, [place]  Pager [phone]

## 2017-06-10 NOTE — Progress Notes (Signed)
Physical Therapy Flowsheet:       06/10/17 1500   Visit Details [facility])   Visit Type [facility]) Follow Up   Reason for visit [facility]) General   PT [place]   Visit Number   Visit Number [facility]) / Treatment Day (HH) 11   Precautions/Observations   Precautions used Yes   LDA Observation Feeding tube   Fall Precautions General falls precautions   Pain Assessment   *Is the patient currently in pain? Yes   Pain (Before,During, After) Therapy During;Before;After   0-10 Scale 3   Pain Location Abdomen   Pain Intervention(s) Refer to nursing for pain management   Cognition   Arousal/Alertness Appropriate responses to stimuli   Following Commands Follows simple commands consistently   Additional Comments Pt responding well to encouragement during session   Bed Mobility   Bed mobility Not tested   Additional comments Pt greeted sitting in chair, returned to chair at end of session   Transfers   Transfers Tested   Sit to Stand 1 person assist;Moderate    Stand to sit 1 person assist;Moderate    Transfer Assistive Device rolling walker   Additional comments Pt performing x1 from recliner, x1 from toilet with grab bars, x3 from elevated chair with arm rests.   Mobility   Mobility Tested   Gait Pattern Decreased cadence   Ambulation Assist Contact guard;1 person assist   Ambulation Distance (Feet) 55'; 225'; 120'   Ambulation Assistive Device rolling walker   Stairs Assistance Not tested   Additional comments Pt taking seated rest between ambulation distances. Pt able to progressed distance when distracted by conversation. Pt c/o pain, fatigue during mobility, resopnding to encourgament to progress ambulation distance.   Balance   Sitting - Static Independent ;Supported   Standing - Cytogeneticist guard;Supported   Standing - Technical brewer guard;Supported   Functional Outcome Measures   Functional Outcome Measures Yes   PT AM-PAC Mobility   Turning over in bed? 1   Sitting down on and standing up from a chair with  arms? 1   Moving from lying on back to sitting on the side of the bed? 1   Moving to and from a bed to a chair? 2   Need to walk in hospital room? 3   Climbing 3 - 5 steps with a railing? 3   Total Raw Score 11   Standardized Score 33.86   CMS 1-100% Score 73   Assessment   Brief Assessment Remains appropriate for skilled therapy   Plan/Recommendation   Treatment Interventions Restorative PT   PT Frequency 2-4x/wk   Mobility Recommendations 1A with RW. Please assist with ambulation 3x/day   Referral Recommendations PM&R Consult;OT   Discharge Recommendations Acute Rehab   PT Discharge Equipment Recommended To be determined   Assessment/Recommendations Reviewed With: Patient;Nursing;Family   Next PT Visit BLE strengthening, progress all mobility   Time Calculation   PT Timed Codes 47   PT Untimed Codes 0   PT Unbilled Time 3   PT Total Treatment 47   Plan and Onset date   Plan of Care Date 05/28/17   Treatment Start Date 04/21/17   Please page with any questions or concerns.    Laural Roes, PT, DPT  Pager [phone]

## 2017-06-10 NOTE — Progress Notes (Signed)
Hepatobiliary-GI Surgery Procedure Note  Mediport Removal Note    The pt's mediport was removed at the bedside. The pt tolerated the procedure well and without complication.    The pt's mediport site was prepped and draped in a sterile fashion. A wide field block was administered around the site with 1% Lidocaine with anesthetic effect achieved. A 3-cm incision was made at the previously-healed incision site superior to the mediport. The capsule of the mediport was incised and dissected; the 3 stay sutures were removed at that time. The catheter was removed and pressure was held for 5 minutes to ensure hemostasis. The site was properly irrigated and inspected with hemostasis achieved. The site was closed with interrupted vicryl sutures placed in a deep dermal fashion with sufficient space so as to allow the site to drain. The site was dressed with a dry dressing. The pt tolerated the procedure well and without complication.    Amanda Rosenthal, MD  06/10/2017, [phone]

## 2017-06-10 NOTE — Progress Notes (Signed)
Daughter informed SW that team was hopeful for PEG tube on Thursday of this week. Daughter and pt also met with [facility] for medicaid application as of this afternoon. Will continue to follow and plan for safe discharge.

## 2017-06-10 NOTE — Progress Notes (Signed)
Chart reviewed, pt eating more and doing well w/ bolus fds after PO. Noted pt has infected mediport - discussed w/ covering provider and plan for mediport to be removed at bedside then PICC line placed. Visited w/ pt and dtr [name] briefly. Pt looking fatigued and initially feeling down but w/ coaxing by Levada Dy RN and questioning pt smiled and proudly reported she enjoyed a "delicious" dinner of roast beef, mashed potatoes w/ gravy, green beans and vanilla milkshake. Additionally pt ambulated around unit today w/ walker and was able to endorse that although she has had many set backs she is overall doing better and moving forward in her recovery. She is using dilaudid prn sparingly, stool today.    Spent 20 minutes w/ >50% time in coordination and collaboration of care.    Elroy Channel NP  Palliative Care Consult Service  Pager # [phone]

## 2017-06-10 NOTE — Invasive Procedure Plan of Care (Signed)
Invasive Procedure Plan of Care (Consent Form 419):   Condition(s) Addressed: Bacteremia   Performing Provider: Cherylann Banas, and Other Residents/Fellows on the Hepatobiliary-GI Surgery Team.   Side: Right    Procedure: Mediport Removal and all other indicated procedures.   Special Equipment:    Planned Anesthesia: Regional   Benefits: Resolution of bacteremia   Risks: Bleeding, infection, damage to surrounding structures, need for further or emergent procedures.   Alternatives: No procedure   Expected Length of Stay: 7 day(s)     I, or a designated member of my surgical team, have discussed the planned procedure, including the potential for any transfusion of blood products or receipt of tissue as necessary, expected benefits, the potential complications and risks and possible alternatives and their benefits and risks with the patient or the patient's surrogate. In my opinion, the patent or the patient's surrogate understands the proposed procedure, its risks, benefits, and alternatives.    Electronically signed by Bishop Dublin, MD at 5:55 PM     Patient Consent:  I hereby give my consent and authorize BOODRY, [name], and Other Residents/Fellows on the Hepatobiliary-GI Surgery Team.  (The list of possible assistants, all of whom are privileged to provide surgical services at the hospital, is available)  To treat the following: Bacteremia  Procedure includes: Mediport Removal and all other indicated procedures.  Laterality: Right  1 The care provider has explained my condition to me, the benefits of having the above treatment procedure, and alternate ways of treating my condition. I understand that no guarantees have been made to me about the result of the treatment. The alternatives to this procedure include: No procedure   2 The care provider has discussed with me the reasonably foreseeable risks of the treatment and that there may be undesirable results. The risks that are specifically related to  this procedure include: Bleeding, infection, damage to surrounding structures, need for further or emergent procedures.   3 I understand that during the treatment a condition may be discovered which was not known before the treatment started. Therefore, I authorize the care provider to perform any additional or different treatment which is thought necessary and available.   4 Any tissue, parts, or substances removed during the procedure may be retained or disposed of in accordance with customary scientific, educational and clinical practice.   5 Vendor information if appropriate:    6 If blood products are needed, I would agree:    7 If tissue products are needed, I would agree:    8 Blood/Tissue use limitations and/or exclusions:       I have carefully read and fully understand this informed consent form, and have had sufficient opportunity to discuss my condition and the above procedure(s) with the care provider and his/her associates, and all of my questions have been answered to my satisfaction. I understand that my surgeon/provider performing the procedure may not be physically present in the operating/procedure room the entire time that I am there. My surgeon/provider has answered my questions regarding this and how it may relate to my surgery/procedure. I agree to the Plan of Care as outlined above.                       Patient Signature   (or Parent/Legal Guardian if pt is unable to sign or is a minor)  Date/Time     Electronic Signatures will display at the bottom of the consent form.

## 2017-06-10 NOTE — Progress Notes (Signed)
ID Progress Note    Interval:   Afebrile, BC from mediport + for VRE from 4/6. Pigtail taken of on 4/7      Subjective:    Feels about the same as she always does. Did smile to me. Abdominal pain is the same, not very severe. Denied n/v/chills/rigors/night sweats    Objective:    Temp:  [36 C (96.8 F)-36.7 C (98.1 F)] 36 C (96.8 F)  Heart Rate:  [89-99] 89  Resp:  [16] 16  BP: (102-132)/(68-70) 130/70    PHYSICAL EXAM  General: Sitting in chair, looked little uncomfortable but NAD also smiled today,cooperative  Lines:, Right chest Mediport de-accessed, PIVs  HEENT: Atraumatic head, EOMI  Cardiovascular: Regular rate and rhythm, no murmurs  Lungs: Breathing well on room air  Abdomen: Soft, no drains in place  Neurologic: grossly normal motor/sensory  Extremities: No cyanosis or edema bilaterally      Labs:      Recent Labs  Lab 06/10/17  0106 06/09/17  0401 06/07/17  2340   WBC 8.4 7.8 8.3   Hemoglobin 7.7* 7.5* 8.1*   Hematocrit 24* 25* 25*   Platelets 488* 448* 508*   Seg Neut % 73.4 67.9 76.3       Recent Labs  Lab 06/10/17  0106 06/09/17  0401 06/07/17  2340   Sodium 132*   131* 132* 132*   Potassium 4.0   4.0 4.0 4.1   Chloride 95*   96 96 95*   CO2 '20   21 23 22   ' UN '14   15 16 15   ' Creatinine 0.57   0.57 0.59 0.73   Glucose 107*   105* 111* 119*   Calcium 8.0*   8.2* 8.5* 8.2*       Recent Labs  Lab 06/10/17  0106 06/07/17  0031 06/06/17  0114   Albumin 2.2* 2.3* 2.4*   Total Protein 6.3 6.7 6.8   ALT 57* 49* 47*   AST 91* 76* 69*   Alk Phos 407* 453* 489*     Recent Labs      06/10/17   0106   INR  1.4*         Recent Labs  Lab 06/09/17  0401 06/07/17  0031   Sedimentation Rate 69* 35*   CRP 184* 186*        Recent Labs  Lab 06/06/17  0905 06/05/17  2305   Lactate 2.2 2.3*        Imaging:  *chest Standard Single View    Result Date: 06/09/2017  Interval removal of previously seen left-sided pigtail drainage catheter. No definite pneumothorax. END OF IMPRESSION [facility] submits this DICOM format image  data and final report to the [facility], an independent secure electronic health information exchange, on a reciprocally searchable basis (with patient authorization) for a minimum of 12 months after exam date.    *chest Standard Single View    Result Date: 06/09/2017  Overall, no significant change in the exam END OF IMPRESSION [facility] submits this DICOM format image data and final report to the [facility], an independent secure electronic health information exchange, on a reciprocally searchable basis (with patient authorization) for a minimum of 12 months after exam date.    *chest Standard Single View    Result Date: 06/08/2017  Small loculated left-sided pleural effusion, stable compared to prior radiograph. Stable left-sided pleural drain which appears kinked. END OF IMPRESSION I have personally reviewed the  images and the Resident's/Fellow's interpretation and agree with or edited the findings. [facility] submits this DICOM format image data and final report to the [facility], an independent secure electronic health information exchange, on a reciprocally searchable basis (with patient authorization) for a minimum of 12 months after exam date.          Microbiology:  1/25: Intraoperative bile: GS 0 pmns, no organisms, Cx: no growth  2/12: 1 set of blood cultures from the right Mediport (no peripheral bld was sent): positive for Enterobacter cloacae complex in 70 hrs, sensitive to zosyn.   2/18 abdominal GS had >25 PMNs, many GPC in pairs/chains, many GNB and cultures 4+ Enterobacter cloacae complex. The cultures also grew 1+ Candida glabrata   2/21: BCx from the Right peripheral IV, mediport, L IJ:  No growth  05/03/17: IR-guided I&D with drain placement of the left upper quadrant collection: GS > 25 PMNs, many GNB and GN diplococci, Cultures with 4+ Enterobacter cloacae complex(resistant to Zosyn and CTX), C. Albicans and C. Glabrata, and Candida tropicalis  05/04/17:   1 set of blood cultures from  left IJ: VRE at 15.3 TTP. Daptomycin sensitivities: MIC 53mg/ml (dose dependent),  Linezolid  1 set from periphery: VRE and Enterobacter cloacae at 14.8 TTP   05/05/17: IR-guided I&D with drain placement of the intrahepatic collection: GS 1-10 PMNs, very few GPC in pairs. Fungal cultures with Candida glabrata  05/05/17: Blood cultures from periphery, Mediport and IJ: no growth  05/04/17: Urine cultures obtained (for unclear reason): no growth  05/08/17: Abscess GS: >25 PMNs, many GPC in chains, few GNB. Aerobic cultures growing 2+ Enterobacter (ertapenem, Cipro, TMP/SMX-sensitive) and 3+VRE, Fungal: C. Glabrata (sens to caspo, vori and dose dependent sens to fluconazole)  06/06/17: 2 sets of BCx (1 set each from periphery and mediport) - taken after restarting ertapenem  06/08/17: Mediport BC + for VRE in 23 hrs, peripheral cx negative    Assessment:  - VRE bacteremia - originally due to the pelvic abscesses, now seeded the mediport  - Liver and abdominal abscesses       Recommendations:  - Continue ertapenem 1gm daily and daptomycin at 862mkg. Continue fluconazole 8008maily.   - Twice weekly liver chemistry while on the fluconazole  - Agree with mediport removal   - Duration of Abx will be until abscess resolution      ID will follow for final recommendations.       Discussed with Infectious Disease Attending, Dr BonKandis Mannan   Page with Qs.      SanRosina LowensteinD  Infectious Diseases fellow, PGY-4  Pager #[phone]

## 2017-06-10 NOTE — Progress Notes (Signed)
Surgical Oncology/Hepatobiliary Surgery Progress Note     LOS: 73 days     Subjective:  Interval Events:   NAEO. Pt continues to be afebrile. IR pigtail removed yesterday, no evidence of PTX on repeat CXR. Mediport cultures grew VRE yesterday, stopped accessing the port and placed peripheral line for delivery of antibiotics. Otherwise noted to be doing well, no complaints at the bedside this AM.    Objective:  Vitals Sign Ranges for Past 24 Hours:  BP: (102-128)/(62-80)   Temp:  [35.1 C (95.1 F)-36.7 C (98.1 F)]   Temp src: Temporal (04/08 0409)  Heart Rate:  [90-99]   Resp:  [16]   SpO2:  [92 %-100 %]     Physical Exam:  General Appearance: in NAD  Cardiac: regular rate  Respiratory: non-labored breathing on room air  Abdomen: Soft, non tender to palpation, non distended, incision well healed   Extremities: warm, erythema of the bilateral anterior calves with small amount of skin breakdown, minimal edema no posterior calf tenderness   Drains: L-IR Pigtail in place w/ serous output  Labs:    CBC:    Recent Labs  Lab 06/10/17  0106 06/09/17  0401 06/07/17  2340 06/07/17  0031 06/06/17  0114 06/05/17  2305   WBC 8.4 7.8 8.3 6.8 9.5 10.5*   Hemoglobin 7.7* 7.5* 8.1* 6.6* 6.9* 7.3*   Hematocrit 24* 25* 25* 22* 22* 24*   Platelets 488* 448* 508* 493* 542* 810*       Metabolic Panel:    Recent Labs  Lab 06/10/17  0106 06/09/17  0401 06/07/17  2340 06/07/17  0031 06/06/17  0114 06/05/17  2305   Sodium 132*   131* 132* 132* 131* 126* 131*   Potassium 4.0   4.0 4.0 4.1 4.0 4.7 4.8   Chloride 95*   96 96 95* 94* 89* 92*   CO2 _0 UN _1 22* 21*   Creatinine 0.57   0.57 0.59 0.73 0.81 0.82 0.85   Glucose 107*   105* 111* 119* 101* 98 112*   Calcium 8.0*   8.2* 8.5* 8.2* 8.1* 8.8 8.8   Magnesium 1.5*  --   --   --  1.7  --    Phosphorus 4.3  --   --   --  4.9*  --           Assessment:  72 y.o. female with h/o recent PE and pancreatic cancer POD # 59  status post whipple procedure  complicated by delayed gastric emptying.  NGT replaced on 04/04/17. UGI on 04/12/17 concerning for obstruction just beyond the [place] in the efferent limb.  Course complicated on 1/75/10 by rising LFTs and sepsis with CT concerning for cholangitis given biliary tree obstruction and PV compression due to hematoma and afferent limb distention in setting of PE treatment. Is s/p IR PTC on 04/16/17 and IR percutaneous aspiration of anterior abdominal fluid collection on 04/24/17. IR LUQ perc drain placement 3/1. IR anterior abdominal 59f perc drain placement 3/3. IR anterior perc drain into left hepatic collection 3/6.    Plan:  - Regular Diet with TF's. Will re-engage IR about poss percutaneous G-tube placement this AM.   - Plan to continue previous IV antibiotic regimen (Ertapenem and Daptomycin) per ID Recommendations. Will need to d/c Mediport this AM. Will plan for PICC line for long-term antibiotics. Plan to repeat CT Scan this  week to reassess collections.  - Plan to touch base with the Infectious Disease team this morning concerning definitive antibiotic choice and duration of therapy. Appreciate recommendations.  - Hyponatremia: Stable from previous.  - Anemia; stable.  - Aggressive bowel regimen  - Biliary drain removed 3/26  - SCDs, Continue Therapeutic Lovenox.  - Dispo: Pending clinical course, adequate nutrition/PO intake. Pt has Physical Debilitation requiring a rehab setting. PT/OT recommending SNF.    Bishop Dublin, MD  06/10/2017     6:25 AM   General Surgery Resident

## 2017-06-11 ENCOUNTER — Inpatient Hospital Stay: Payer: Medicare (Managed Care)

## 2017-06-11 LAB — EKG 12-LEAD
P: 24 deg
P: 11 deg
PR: 180 ms
PR: 176 ms
QRS: 54 deg
QRS: 33 deg
QRSD: 78 ms
QRSD: 68 ms
QT: 376 ms
QT: 380 ms
QTc: 476 ms
QTc: 463 ms
Rate: 96 {beats}/min
Rate: 89 {beats}/min
Severity: NORMAL
Severity: NORMAL
T: 65 deg
T: 32 deg

## 2017-06-11 LAB — BASIC METABOLIC PANEL
Anion Gap: 16 (ref 7–16)
CO2: 21 mmol/L (ref 20–28)
Calcium: 8.2 mg/dL — ABNORMAL LOW (ref 8.6–10.2)
Chloride: 95 mmol/L — ABNORMAL LOW (ref 96–108)
Creatinine: 0.56 mg/dL (ref 0.51–0.95)
GFR,Black: 109 *
GFR,Caucasian: 94 *
Glucose: 119 mg/dL — ABNORMAL HIGH (ref 60–99)
Lab: 12 mg/dL (ref 6–20)
Potassium: 4.6 mmol/L (ref 3.3–5.1)
Sodium: 132 mmol/L — ABNORMAL LOW (ref 133–145)

## 2017-06-11 LAB — CBC AND DIFFERENTIAL
Baso # K/uL: 0.1 10*3/uL (ref 0.0–0.1)
Basophil %: 0.9 %
Eos # K/uL: 0.4 10*3/uL (ref 0.0–0.4)
Eosinophil %: 4.5 %
Hematocrit: 28 % — ABNORMAL LOW (ref 34–45)
Hemoglobin: 8.6 g/dL — ABNORMAL LOW (ref 11.2–15.7)
IMM Granulocytes #: 0.2 10*3/uL
IMM Granulocytes: 1.7 %
Lymph # K/uL: 1.5 10*3/uL (ref 1.2–3.7)
Lymphocyte %: 15.3 %
MCH: 28 pg/cell (ref 26–32)
MCHC: 31 g/dL — ABNORMAL LOW (ref 32–36)
MCV: 90 fL (ref 79–95)
Mono # K/uL: 1.1 10*3/uL — ABNORMAL HIGH (ref 0.2–0.9)
Monocyte %: 11.4 %
Neut # K/uL: 6.5 10*3/uL — ABNORMAL HIGH (ref 1.6–6.1)
Nucl RBC # K/uL: 0 10*3/uL (ref 0.0–0.0)
Nucl RBC %: 0 /100 WBC (ref 0.0–0.2)
Platelets: 494 10*3/uL — ABNORMAL HIGH (ref 160–370)
RBC: 3.1 MIL/uL — ABNORMAL LOW (ref 3.9–5.2)
RDW: 17.1 % — ABNORMAL HIGH (ref 11.7–14.4)
Seg Neut %: 66.2 %
WBC: 9.8 10*3/uL (ref 4.0–10.0)

## 2017-06-11 LAB — AEROBIC CULTURE: Aerobic Culture: 0

## 2017-06-11 LAB — ANAEROBIC CULTURE: Anaerobic Culture: 0

## 2017-06-11 LAB — APTT: aPTT: 36.4 s (ref 25.8–37.9)

## 2017-06-11 LAB — PLATELET COUNT: Platelets: 507 10*3/uL — ABNORMAL HIGH (ref 160–370)

## 2017-06-11 LAB — POCT GLUCOSE: Glucose POCT: 106 mg/dL — ABNORMAL HIGH (ref 60–99)

## 2017-06-11 LAB — FUNGUS CULTURE

## 2017-06-11 MED ORDER — HEPARIN INFUSION 100 UNITS/ML (NEUR PROTOCOL) *I*
0.0000 [IU]/h | INTRAVENOUS | Status: DC
Start: 2017-06-11 — End: 2017-06-12
  Administered 2017-06-11 (×2): 900 [IU]/h
  Administered 2017-06-11 (×2): 1050 [IU]/h
  Administered 2017-06-11: 1050 [IU]/h via INTRAVENOUS
  Administered 2017-06-11: 900 [IU]/h via INTRAVENOUS
  Administered 2017-06-11 (×2): 900 [IU]/h
  Administered 2017-06-12: 1050 [IU]/h via INTRAVENOUS
  Administered 2017-06-12: 1200 [IU]/h via INTRAVENOUS
  Administered 2017-06-12 (×2): 1200 [IU]/h
  Administered 2017-06-12: 1050 [IU]/h
  Filled 2017-06-11 (×2): qty 250

## 2017-06-11 NOTE — Progress Notes (Signed)
Palliative Care Progress Note    HPI: [name] w/ PMH fibromyalgia, pancreatic CA s/p Whipple for 'cure' 03/29/17; s/p ICU w/ intub for resp failure & septic shock, weaned from pressors and extubated to HFNC 2/17 w/ IR drainage bili fluid collection 2/19, s/p IV abx, now all abd drains for abscesses removed. Palliative following for symptoms, started methadone 2/19, weaned dose 2/23 d/t concern for sedation. Pt w/ ongoing poor po intake, now tolerating bolus fds via dubhoff fdg tube. Unfortunately not able to get PEG w/o going to OR (anticipate 4/11), s/p R pleural pigtail for ~ 1 L drainage, now removed and + VRE from mediport, now removed 4/8. Goal dispo SNF Rehab.    Subjective: "It was terrible getting the line out yesterday. I'm still pretty drained. I haven't walked yet, maybe later. I ate well yesterday but not so much today"    Objective: chart reviewed, pt improved w/ IV abx, reports she had difficult experience w/ mediport removal yesterday and is grateful to have line out, reports tenderness at site, dsg intact; pt more sleepy this afternoon though did engage in conversation, happy her dtr [name] is taking break w/ friend today    Palliative Care ROS:  Pain mild  Nausea no  Dyspnea no  Weakness yes  Constipation no  Delirum no  Last stool 4/9    Physical Examination:   BP: (92-120)/(60-76)   Temp:  [36.2 C (97.2 F)-36.9 C (98.4 F)]   Temp src: Temporal (04/09 1626)  Heart Rate:  [84-95]   Resp:  [16]   SpO2:  [93 %-96 %]   Gen appearance: [name], sitting up in recliner, feeling tired, no acute symptoms  Lungs: easy resp, NC  Extrem: mod LE edema, reddened skin LEs  Skin: warm, dry  Neuro:  A+O x 3; at baseline ambulates short distances w/ walker & standby assist -reports too lethargic so far today to ambulate though feels she may be up for it later    Assessment/Plan: [name] w/ pancreatic CA, s/p Whipple in Jan. s/p TPN, s/p perc drains and IV abx w/ resolving abd abscesses, ongoing  L pleural effusion. Pt started trial of bolus TFs if she does not eat at least 50% meal. Pt's need for dubhoff is barrier to discharge to SNF Rehab and may likely need LTC regardless. Per notes, anticipate taking pt to OR to place PEG on Thursday which would move forward her dispo planning and ability to get more intensive rehab. Remains on IV abx for recent VRE bacteremia, s/p mediport removal yesterday.    Pain/dyspnea  Acetaminophen 1 gm po TID  Methadone 5/7.5/7.5 mg po TID (total 20 mg/d, last increased 3/11)  Dilaudid 2 mg po Q 4 hrs prn (x 1 on 4/8, x 1 on 4/9)  Lidocaine patch daily x 2 (mid back, LUE)  Encourage incentive spirometry prn     Anxiety/agitation/nausea/fatigue  Duloxetine 20 mg po daily  Reglan 10 mg po 4x/d (AC & QHS)  Pantoprazole 40 mg po BID  Calcium carbonate 1 gm po BID prn   Ondansetron 4 mg IV Q 6 hrs prn   Ritalin 2.5 mg po at 8 am and 2.5 mg at 12 pm (reduced dose 3/26) per Ph[facility] unlikely ritalin is cause of mild confusion or appetite suppression  Mirtazapine 7.5 mg po QHS (started 4/2)    Prevention of constipation, last stool 4/9  Colace 200 mg po BID  Miralax 17 gm po daily  Senna  2 tab po BID  Bisacodyl supp pr daily     Dry flaky edematous Skin  Bacitracin oint BID to BLEs  Eucerin prn for BLEs    GOC/prognosis  Full code  Dtr Rod Mae is HCP  Dispo plans now for SNF Rehab and may likely need LTC for a period in addition   4/1 initiating trial of PO meals first & bolus fds after if not eating at least 50% of meal.  Pt anticipated to get PEG fdg tube placed in OR 4/11    Pt case discussed w/ RN and covering provider.    We will continue to follow along, please call if questions/concerns. Will plan to see pt again on 4/11.    Total Time Spent 45 minutes: >50% of time was spent in counseling and/or coordination of care.     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.9    Hypothyroidism E03.9    Pancreatic cancer C25.9    Anemia D64.9     hx of Pulmonary embolism I26.99    Depression F32.9    Sepsis A41.9       No Known Allergies (drug, envir, food or latex)    Scheduled Meds:    daptomycin IV  8 mg/kg Intravenous Q24H    methylphenidate  2.5 mg Oral BID    polyethylene glycol  17 g Per G Tube Daily    ertapenem  1,000 mg Intravenous Q24H    insulin glargine  4 Units Subcutaneous Nightly    lidocaine  1 patch Transdermal Q24H    methadone  5 mg Oral Daily    triamterene-hydrochlorothiazide  1 tablet Oral QAM    bacitracin zinc-polymyxin B sulfate   Topical 2 times per day    lidocaine  1 patch Transdermal Q24H    methadone  7.5 mg Oral BID    fluconazole  800 mg Oral Daily    pantoprazole  40 mg Oral BID AC    senna  2 tablet Oral 2 times per day    bisacodyl  10 mg Rectal Daily    DULoxetine  20 mg Oral Daily    metoclopramide  10 mg Oral 4x Daily AC & HS    docusate sodium  200 mg Oral 2 times per day    acetaminophen  1,000 mg Oral Q8H    levothyroxine  137 mcg Oral Daily       Continuous Infusions:    heparin 900 Units/hr (06/11/17 1030)    sodium chloride      sodium chloride 100 mL/hr (06/11/17 1609)       PRN Meds:  sodium chloride, dextrose, ibuprofen, HYDROmorphone, naloxone, sodium chloride, dextrose, calcium carbonate, heparin lock flush, ondansetron

## 2017-06-11 NOTE — Progress Notes (Addendum)
Surgical Oncology/Hepatobiliary Surgery Progress Note     LOS: 74 days     Subjective:  Interval Events:     NAEO. Pt continues to be afebrile. IR pigtail removed 04/07. Mediport removed at the bedside yesterday 04/08. Otherwise noted to be doing well. No complaints at the bedside this AM.    Objective:  Vitals Sign Ranges for Past 24 Hours:  BP: (92-132)/(62-76)   Temp:  [36 C (96.8 F)-36.9 C (98.4 F)]   Temp src: Temporal (04/09 0830)  Heart Rate:  [87-95]   Resp:  [16]   SpO2:  [93 %-98 %]     Physical Exam:  General Appearance: in NAD  Cardiac: regular rate  Respiratory: non-labored breathing on room air  Abdomen: Soft, non tender to palpation, non distended, incision well healed   Extremities: warm, erythema of the bilateral anterior calves with small amount of skin breakdown, minimal edema no posterior calf tenderness     Labs:    CBC:    Recent Labs  Lab 06/11/17  0201 06/10/17  0106 06/09/17  0401 06/07/17  2340 06/07/17  0031 06/06/17  0114   WBC 9.8 8.4 7.8 8.3 6.8 9.5   Hemoglobin 8.6* 7.7* 7.5* 8.1* 6.6* 6.9*   Hematocrit 28* 24* 25* 25* 22* 22*   Platelets 494* 488* 448* 508* 493* 073*       Metabolic Panel:    Recent Labs  Lab 06/11/17  0201 06/10/17  0106 06/09/17  0401 06/07/17  2340 06/07/17  0031 06/06/17  0114   Sodium 132* 132*   131* 132* 132* 131* 126*   Potassium 4.6 4.0   4.0 4.0 4.1 4.0 4.7   Chloride 95* 95*   96 96 95* 94* 89*   CO2 _0 UN _1 22*   Creatinine 0.56 0.57   0.57 0.59 0.73 0.81 0.82   Glucose 119* 107*   105* 111* 119* 101* 98   Calcium 8.2* 8.0*   8.2* 8.5* 8.2* 8.1* 8.8   Magnesium  --  1.5*  --   --   --  1.7   Phosphorus  --  4.3  --   --   --  4.9*          Assessment:  72 y.o. female with h/o recent PE and pancreatic cancer POD # 74  status post whipple procedure complicated by delayed gastric emptying.  NGT replaced on 04/04/17. UGI on 04/12/17 concerning for obstruction just beyond the [place] in the efferent limb.  Course complicated  on 09/11/60 by rising LFTs and sepsis with CT concerning for cholangitis given biliary tree obstruction and PV compression due to hematoma and afferent limb distention in setting of PE treatment. Is s/p IR PTC on 04/16/17 and IR percutaneous aspiration of anterior abdominal fluid collection on 04/24/17. IR LUQ perc drain placement 3/1. IR anterior abdominal 46f perc drain placement 3/3. IR anterior perc drain into left hepatic collection 3/6.    Plan:  - Regular Diet with TF's. IR planning for G-tube placement Thur (04/11).  - Plan to continue previous IV antibiotic regimen (Ertapenem and Daptomycin) per ID Recommendations. Will need PICC line for long-term antibiotics, can be placed once BCxs have been negative for 48-hrs.  - Continue Ertapenem, Daptomycin, Fluconazole per ID recommendations. Duration until resolution of Hepatic abscess.  - Hyponatremia: Stable from previous.  - Anemia; stable.  - Aggressive bowel regimen  -  Biliary drain removed 3/26  - SCDs, start heparin gtt today due to pending IR perc G-tube procedure  - Dispo: Pending clinical course, adequate nutrition/PO intake. Pt has Physical Debilitation requiring a rehab setting. PT/OT recommending SNF.    Belinda Fisher, MD  06/11/2017     8:37 AM   General Surgery Resident        HPB-GI Attending Addendum:   I personally examined the patient, reviewed the notes, and discussed the plan of care with the residents and patient. Agree with detailed resident's note. Please see the note above for details of history, exam, labs, assessment/plan which reflect my input.      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     Bacteremia with VRE. Mediport removed.   Hyperglycemia with insulin sliding scale and recurrent need for insulin. continue   Post operative anemia. Stable. No need for transfusion  Pleural effusion resolved with pigtail catheter.  Pulmonary  embolism. Present on admission. Heparin drip.  Hypoalbuminemia. Moderate malnutrition. Tube feedings at goal.cycled. Encouraging oral intake  Depression. Encouragement provided. Slightly improved. Ritalin helpful.   Constipation . Aggressive regimen.   Physical debility. Requires rehabilitation setting.      Delano Metz, MD, FACS  Hepatobiliary, Pancreas & GI Surgery

## 2017-06-11 NOTE — Plan of Care (Signed)
Bowel Elimination     Elimination patterns are normal or improving Progressing towards goal        Fluid and Electrolyte Imbalance     Fluid and Electrolyte imbalance Progressing towards goal        GI Bleeding Elimination     Elimination of patterns are normal or improving Progressing towards goal        Mobility     Functional status is maintained or improved - Geriatric Progressing towards goal     Patient's functional status is maintained or improved Progressing towards goal        Nutrition     Patient's nutritional status is maintained or improved Progressing towards goal     Nutritional status is maintained or improved - Geriatric Progressing towards goal        Pain/Comfort     Patient's pain or discomfort is manageable Progressing towards goal        Post-Operative Bowel Elimination     Elimination pattern is normal or improving Progressing towards goal        Post-Operative Hemodynamic Stability     Maintain Hemodynamic Stability Progressing towards goal        Psychosocial     Demonstrates ability to cope with illness Progressing towards goal        Safety     Patient will remain free of falls Progressing towards goal

## 2017-06-12 LAB — CK: CK: 47 U/L (ref 26–192)

## 2017-06-12 LAB — DIFF MANUAL
Diff Based On: 112 CELLS
React Lymph %: 2 % (ref 0–6)

## 2017-06-12 LAB — BASIC METABOLIC PANEL
Anion Gap: 13 (ref 7–16)
CO2: 24 mmol/L (ref 20–28)
Calcium: 8.3 mg/dL — ABNORMAL LOW (ref 8.6–10.2)
Chloride: 96 mmol/L (ref 96–108)
Creatinine: 0.54 mg/dL (ref 0.51–0.95)
GFR,Black: 110 *
GFR,Caucasian: 95 *
Glucose: 111 mg/dL — ABNORMAL HIGH (ref 60–99)
Lab: 12 mg/dL (ref 6–20)
Potassium: 4.4 mmol/L (ref 3.3–5.1)
Sodium: 133 mmol/L (ref 133–145)

## 2017-06-12 LAB — CBC AND DIFFERENTIAL
Baso # K/uL: 0 10*3/uL (ref 0.0–0.1)
Basophil %: 0 %
Eos # K/uL: 0.6 10*3/uL — ABNORMAL HIGH (ref 0.0–0.4)
Eosinophil %: 5.4 %
Hematocrit: 22 % — ABNORMAL LOW (ref 34–45)
Hemoglobin: 7.1 g/dL — ABNORMAL LOW (ref 11.2–15.7)
Lymph # K/uL: 2.7 10*3/uL (ref 1.2–3.7)
Lymphocyte %: 24.1 %
MCH: 28 pg/cell (ref 26–32)
MCHC: 32 g/dL (ref 32–36)
MCV: 89 fL (ref 79–95)
Mono # K/uL: 0.8 10*3/uL (ref 0.2–0.9)
Monocyte %: 8 %
Neut # K/uL: 6.3 10*3/uL — ABNORMAL HIGH (ref 1.6–6.1)
Nucl RBC # K/uL: 0 10*3/uL (ref 0.0–0.0)
Nucl RBC %: 0 /100 WBC (ref 0.0–0.2)
Platelets: 540 10*3/uL — ABNORMAL HIGH (ref 160–370)
RBC: 2.5 MIL/uL — ABNORMAL LOW (ref 3.9–5.2)
RDW: 16.8 % — ABNORMAL HIGH (ref 11.7–14.4)
Seg Neut %: 60.7 %
WBC: 10.4 10*3/uL — ABNORMAL HIGH (ref 4.0–10.0)

## 2017-06-12 LAB — HOLD SST

## 2017-06-12 LAB — PLATELET COUNT: Platelets: 537 10*3/uL — ABNORMAL HIGH (ref 160–370)

## 2017-06-12 LAB — APTT
aPTT: 31.2 s (ref 25.8–37.9)
aPTT: 31.4 s (ref 25.8–37.9)

## 2017-06-12 LAB — PROTIME-INR

## 2017-06-12 LAB — POCT GLUCOSE: Glucose POCT: 93 mg/dL (ref 60–99)

## 2017-06-12 MED ORDER — HEPARIN INFUSION 100 UNITS/ML (NEUR PROTOCOL) *I*
0.0000 [IU]/h | INTRAVENOUS | Status: DC
Start: 2017-06-12 — End: 2017-06-13
  Administered 2017-06-12: 900 [IU]/h
  Administered 2017-06-12: 900 [IU]/h via INTRAVENOUS
  Administered 2017-06-12: 1050 [IU]/h
  Administered 2017-06-12 (×2): 900 [IU]/h
  Administered 2017-06-12 (×2): 1050 [IU]/h
  Administered 2017-06-12: 900 [IU]/h via INTRAVENOUS
  Administered 2017-06-12 (×4): 900 [IU]/h
  Administered 2017-06-12: 1050 [IU]/h
  Administered 2017-06-12: 1050 [IU]/h via INTRAVENOUS
  Administered 2017-06-13 (×3): 1200 [IU]/h
  Filled 2017-06-12 (×2): qty 250

## 2017-06-12 NOTE — Consults (Signed)
PICC PRE-PROCEDURE ASSESSMENT     Patient Vascular History/Contraindications: Other positive blood cultures; IVAD removed 4/8 2030    Nephrology note required for PICC placement and provider contacted: No    Labs Reviewed:      Lab results: 06/12/17  0345   GFR,Caucasian 95   GFR,Black 110     No components found for: CREATININE      Lab results: 06/12/17  0345   WBC 10.4*           Lab results: 06/12/17  0345   INR CANCELED           Lab results: 06/12/17  0345   Platelets 540*            Extremity Assessment:  WNL    Recommendations: PICC Line    Refer to Radiology:   No    Name of Provider Contacted: [place].  Can place PICC when blood cultures are negative, likely 0900 4/11.  Thanks,

## 2017-06-12 NOTE — Progress Notes (Addendum)
ID Progress Note    Interval:   Afebrile, HDS    Subjective:    Feels about the same, some abd pain, denied n/v.     Objective:    Temp:  [35.5 C (95.9 F)-36.7 C (98.1 F)] (P) 36.7 C (98.1 F)  Heart Rate:  [84-95] (P) 94  Resp:  [16-18] (P) 18  BP: (94-122)/(60-70) (P) 110/72    PHYSICAL EXAM  No major change since prior.   General: Sitting in chair, looked little uncomfortable but NAD also smiled today,cooperative  Lines:, Right chest Mediport de-accessed, PIVs  HEENT: Atraumatic head, EOMI  Cardiovascular: Regular rate and rhythm, no murmurs  Lungs: Breathing well on room air  Abdomen: Soft, no drains in place  Neurologic: grossly normal motor/sensory  Extremities: No cyanosis or edema bilaterally      Labs:      Recent Labs  Lab 06/12/17  0923 06/12/17  0345 06/11/17  0909 06/11/17  0201 06/10/17  0106   WBC  --  10.4*  --  9.8 8.4   Hemoglobin  --  7.1*  --  8.6* 7.7*   Hematocrit  --  22*  --  28* 24*   Platelets 537* 540* 507* 494* 488*   Seg Neut %  --  60.7  --  66.2 73.4       Recent Labs  Lab 06/12/17  0345 06/11/17  0201 06/10/17  0106   Sodium 133 132* 132*   131*   Potassium 4.4 4.6 4.0   4.0   Chloride 96 95* 95*   96   CO2 '24 21 20   21   ' UN '12 12 14   15   ' Creatinine 0.54 0.56 0.57   0.57   Glucose 111* 119* 107*   105*   Calcium 8.3* 8.2* 8.0*   8.2*       Recent Labs  Lab 06/10/17  0106 06/07/17  0031 06/06/17  0114   Albumin 2.2* 2.3* 2.4*   Total Protein 6.3 6.7 6.8   ALT 57* 49* 47*   AST 91* 76* 69*   Alk Phos 407* 453* 489*     Recent Labs      06/12/17   0345  06/10/17   0106   INR  CANCELED  1.4*         Recent Labs  Lab 06/09/17  0401 06/07/17  0031   Sedimentation Rate 69* 35*   CRP 184* 186*        Recent Labs  Lab 06/06/17  0905 06/05/17  2305   Lactate 2.2 2.3*        Imaging:  *chest Standard Single View    Result Date: 06/09/2017  Interval removal of previously seen left-sided pigtail drainage catheter. No definite pneumothorax. END OF IMPRESSION [facility] submits this DICOM  format image data and final report to the [facility], an independent secure electronic health information exchange, on a reciprocally searchable basis (with patient authorization) for a minimum of 12 months after exam date.          Microbiology:  1/25: Intraoperative bile: GS 0 pmns, no organisms, Cx: no growth  2/12: 1 set of blood cultures from the right Mediport (no peripheral bld was sent): positive for Enterobacter cloacae complex in 70 hrs, sensitive to zosyn.   2/18 abdominal GS had >25 PMNs, many GPC in pairs/chains, many GNB and cultures 4+ Enterobacter cloacae complex. The cultures also grew 1+ Candida glabrata   2/21:  BCx from the Right peripheral IV, mediport, L IJ:  No growth  05/03/17: IR-guided I&D with drain placement of the left upper quadrant collection: GS > 25 PMNs, many GNB and GN diplococci, Cultures with 4+ Enterobacter cloacae complex(resistant to Zosyn and CTX), C. Albicans and C. Glabrata, and Candida tropicalis  05/04/17:   1 set of blood cultures from left IJ: VRE at 15.3 TTP. Daptomycin sensitivities: MIC 92mg/ml (dose dependent),  Linezolid  1 set from periphery: VRE and Enterobacter cloacae at 14.8 TTP   05/05/17: IR-guided I&D with drain placement of the intrahepatic collection: GS 1-10 PMNs, very few GPC in pairs. Fungal cultures with Candida glabrata  05/05/17: Blood cultures from periphery, Mediport and IJ: no growth  05/04/17: Urine cultures obtained (for unclear reason): no growth  05/08/17: Abscess GS: >25 PMNs, many GPC in chains, few GNB. Aerobic cultures growing 2+ Enterobacter (ertapenem, Cipro, TMP/SMX-sensitive) and 3+VRE, Fungal: C. Glabrata (sens to caspo, vori and dose dependent sens to fluconazole)  06/06/17: 2 sets of BCx (1 set each from periphery and mediport) - taken after restarting ertapenem  06/08/17: Mediport BC + for VRE in 23 hrs, peripheral cx negative. Mediport removed on 06/10/17.    Assessment:  - VRE bacteremia: patient was not on VRE coverage from 3/28 to 4/3  (as no decent oral regimen for it was found; linezolid not the best option given drug interactions with her other medications) originally due to the pelvic abscesses, now seeded the mediport, s/p mediport removal on 06/10/17.   - Liver and abdominal abscesses - as a complication after the Whipple surgery and gastric outlet obstruction which led to cholangitis in the early course of her hospitalization.   - Left pleural effusion, s/p left chest tube placement on 06/07/17. Exudative. Pleural fluid GS and cultures negative. Chest tube removed on 06/09/17.       Recommendations:  - Continue daptomycin at 8100mkg and fluconazole 80025maily.  till 06/20/17 (see below).   - Discontinue ertapenem, restart TMP/SMX DS 1 tablet every 12 hours, for Enterobacter coverage.  - Repeat CT AP 2 weeks from the last (ie on 06/20/17) to reassess the size of the liver and abdominal-pelvic abscesses which will help determine further duration of the Abx.  - Once weekly liver chemistry while on the fluconazole.  - ID team 3 to resume care from 06/13/17.     ID will follow for final recommendations.       Discussed with Infectious Disease Attending, Dr BonKandis Mannan   Page with Qs.      SanRosina LowensteinD  Infectious Diseases fellow, PGY-4  Pager #[phone]

## 2017-06-12 NOTE — Plan of Care (Signed)
Problem: Safety  Goal: Patient will remain free of falls  Outcome: Maintaining     Problem: Pain/Comfort  Goal: Patient's pain or discomfort is manageable  Outcome: Maintaining     Problem: Mobility  Goal: Functional status is maintained or improved - Geriatric  Outcome: Maintaining     Problem: Nutrition  Goal: Patient's nutritional status is maintained or improved  Outcome: Maintaining

## 2017-06-12 NOTE — Progress Notes (Addendum)
Surgical Oncology/Hepatobiliary Surgery Progress Note     LOS: 75 days     Subjective:  Interval Events:   NAEO. Pt continues to be afebrile. Blood cultures drawn yesterday per ID recommendations, have shown no growth to date. Hopeful for perc G-tube placement w/ IR Thurs (04/11). Will plan to make NPO and pause Heparin gtt in anticipation tonight.    Objective:  Vitals Sign Ranges for Past 24 Hours:  BP: (94-122)/(60-76)   Temp:  [35.5 C (95.9 F)-36.4 C (97.5 F)]   Temp src: Temporal (04/10 0323)  Heart Rate:  [84-95]   Resp:  [16]   SpO2:  [95 %-100 %]     Physical Exam:  General Appearance: in NAD  Cardiac: regular rate  Respiratory: non-labored breathing on room air  Abdomen: Soft, non tender to palpation, non distended, incision well healed   Extremities: warm, erythema of the bilateral anterior calves with small amount of skin breakdown, minimal edema no posterior calf tenderness     Labs:    CBC:    Recent Labs  Lab 06/12/17  0345 06/11/17  0909 06/11/17  0201 06/10/17  0106 06/09/17  0401 06/07/17  2340 06/07/17  0031   WBC 10.4*  --  9.8 8.4 7.8 8.3 6.8   Hemoglobin 7.1*  --  8.6* 7.7* 7.5* 8.1* 6.6*   Hematocrit 22*  --  28* 24* 25* 25* 22*   Platelets 540* 507* 494* 488* 448* 508* 332*       Metabolic Panel:    Recent Labs  Lab 06/12/17  0345 06/11/17  0201 06/10/17  0106 06/09/17  0401 06/07/17  2340 06/07/17  0031 06/06/17  0114   Sodium 133 132* 132*   131* 132* 132* 131* 126*   Potassium 4.4 4.6 4.0   4.0 4.0 4.1 4.0 4.7   Chloride 96 95* 95*   96 96 95* 94* 89*   CO2 '24 21 20   21 23 22 20 25   ' UN '12 12 14   15 16 15 17 ' 22*   Creatinine 0.54 0.56 0.57   0.57 0.59 0.73 0.81 0.82   Glucose 111* 119* 107*   105* 111* 119* 101* 98   Calcium 8.3* 8.2* 8.0*   8.2* 8.5* 8.2* 8.1* 8.8   Magnesium  --   --  1.5*  --   --   --  1.7   Phosphorus  --   --  4.3  --   --   --  4.9*          Assessment:  72 y.o. female with h/o recent PE and pancreatic cancer POD # 75  status post whipple procedure complicated  by delayed gastric emptying.  NGT replaced on 04/04/17. UGI on 04/12/17 concerning for obstruction just beyond the [place] in the efferent limb.  Course complicated on 9/51/88 by rising LFTs and sepsis with CT concerning for cholangitis given biliary tree obstruction and PV compression due to hematoma and afferent limb distention in setting of PE treatment. Is s/p IR PTC on 04/16/17 and IR percutaneous aspiration of anterior abdominal fluid collection on 04/24/17. IR LUQ perc drain placement 3/1. IR anterior abdominal 30f perc drain placement 3/3. IR anterior perc drain into left hepatic collection 3/6.    Plan:  - Regular Diet with TF's. IR planning for G-tube placement Thur (04/11). Will plan to make NPO at [place] and pause Heparin gtt at 5:30am in anticipation.  - Plan to continue previous IV antibiotic regimen (  Ertapenem and Daptomycin) per ID Recommendations. Duration until resolution of Hepatic Abscess.  Will need PICC line for long-term antibiotics, can be placed once BCxs have been negative for 48-hrs. Will follow-up surveillance blood cultures.  - Hyponatremia: Stable from previous.  - Anemia; stable.  - Aggressive bowel regimen  - SCDs, cont heparin gtt today.  - Dispo: Pending clinical course, resolution of acute infection. Pt has Physical Debilitation requiring a rehab setting. PT/OT recommending SNF.    Bishop Dublin, MD  06/12/2017     6:40 AM   General Surgery Resident    HPB-GI Attending Addendum:   I personally examined the patient, reviewed the notes, and discussed the plan of care with the residents and patient. Agree with detailed resident's note. Please see the note above for details of history, exam, labs, assessment/plan which reflect my input.      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      Bacteremia with VRE. Mediport removed.   Hyperglycemia with insulin sliding scale and recurrent  need for insulin. continue   Post operative anemia. Stable. No need for transfusion  Pleural effusion resolved with pigtail catheter.  Pulmonary embolism. Present on admission. Heparin drip.  Hypoalbuminemia. Moderate malnutrition. Tube feedings at goal.cycled. Encouraging oral intake  Depression. Encouragement provided. Slightly improved. Ritalin helpful.   Constipation . Aggressive regimen.   Physical debility. Requires rehabilitation setting.      Delano Metz, MD, FACS  Hepatobiliary, Pancreas & GI Surgery

## 2017-06-13 ENCOUNTER — Inpatient Hospital Stay: Payer: Medicare (Managed Care)

## 2017-06-13 ENCOUNTER — Ambulatory Visit: Payer: Medicare (Managed Care)

## 2017-06-13 DIAGNOSIS — J984 Other disorders of lung: Secondary | ICD-10-CM

## 2017-06-13 LAB — PHOSPHORUS: Phosphorus: 5.1 mg/dL — ABNORMAL HIGH (ref 2.7–4.5)

## 2017-06-13 LAB — CBC AND DIFFERENTIAL
Baso # K/uL: 0.1 10*3/uL (ref 0.0–0.1)
Basophil %: 0.9 %
Eos # K/uL: 0.9 10*3/uL — ABNORMAL HIGH (ref 0.0–0.4)
Eosinophil %: 7.9 %
Hematocrit: 23 % — ABNORMAL LOW (ref 34–45)
Hemoglobin: 7 g/dL — ABNORMAL LOW (ref 11.2–15.7)
Lymph # K/uL: 2.2 10*3/uL (ref 1.2–3.7)
Lymphocyte %: 19.3 %
MCH: 27 pg/cell (ref 26–32)
MCHC: 31 g/dL — ABNORMAL LOW (ref 32–36)
MCV: 88 fL (ref 79–95)
Mono # K/uL: 0.5 10*3/uL (ref 0.2–0.9)
Monocyte %: 4.4 %
Neut # K/uL: 7.8 10*3/uL — ABNORMAL HIGH (ref 1.6–6.1)
Nucl RBC # K/uL: 0 10*3/uL (ref 0.0–0.0)
Nucl RBC %: 0 /100 WBC (ref 0.0–0.2)
Platelets: 561 10*3/uL — ABNORMAL HIGH (ref 160–370)
RBC: 2.6 MIL/uL — ABNORMAL LOW (ref 3.9–5.2)
RDW: 16.8 % — ABNORMAL HIGH (ref 11.7–14.4)
Seg Neut %: 66.6 %
WBC: 11.6 10*3/uL — ABNORMAL HIGH (ref 4.0–10.0)

## 2017-06-13 LAB — DIFF MANUAL
Diff Based On: 114 CELLS
Myelocyte %: 1 % — ABNORMAL HIGH (ref 0–0)

## 2017-06-13 LAB — BASIC METABOLIC PANEL
Anion Gap: 12 (ref 7–16)
CO2: 23 mmol/L (ref 20–28)
Calcium: 8.4 mg/dL — ABNORMAL LOW (ref 8.6–10.2)
Chloride: 97 mmol/L (ref 96–108)
Creatinine: 0.53 mg/dL (ref 0.51–0.95)
GFR,Black: 111 *
GFR,Caucasian: 96 *
Glucose: 86 mg/dL (ref 60–99)
Lab: 11 mg/dL (ref 6–20)
Potassium: 5.1 mmol/L (ref 3.3–5.1)
Sodium: 132 mmol/L — ABNORMAL LOW (ref 133–145)

## 2017-06-13 LAB — EKG 12-LEAD
P: 43 deg
PR: 156 ms
QRS: 20 deg
QRSD: 72 ms
QT: 316 ms
QTc: 412 ms
Rate: 102 {beats}/min
Severity: BORDERLINE
Statement: BORDERLINE
T: 11 deg

## 2017-06-13 LAB — VENOUS BLOOD GAS
Base Excess,VENOUS: 0 mmol/L (ref <=2)
Bicarbonate,VENOUS: 25 mmol/L (ref 21–28)
CO2 (Calc),VENOUS: 26 mmol/L (ref 22–31)
CO: 1.4 %
FO2 HB,VENOUS: 58 % — ABNORMAL LOW (ref 63–83)
Hemoglobin: 7.7 g/dL — ABNORMAL LOW (ref 11.2–15.7)
Methemoglobin: 0.3 % (ref 0.0–1.0)
PCO2,VENOUS: 40 mm Hg (ref 40–50)
PH,VENOUS: 7.42 (ref 7.32–7.42)
PO2,VENOUS: 36 mm Hg (ref 25–43)

## 2017-06-13 LAB — CRP: CRP: 170 mg/L — ABNORMAL HIGH (ref 0–10)

## 2017-06-13 LAB — MAGNESIUM: Magnesium: 1.6 mg/dL (ref 1.6–2.5)

## 2017-06-13 LAB — APTT
aPTT: 33.1 s (ref 25.8–37.9)
aPTT: 36.6 s (ref 25.8–37.9)

## 2017-06-13 LAB — PLATELET COUNT: Platelets: 582 10*3/uL — ABNORMAL HIGH (ref 160–370)

## 2017-06-13 LAB — SEDIMENTATION RATE, AUTOMATED: Sedimentation Rate: 65 mm/hr — ABNORMAL HIGH (ref 0–30)

## 2017-06-13 LAB — AEROBIC CULTURE: Aerobic Culture: 0

## 2017-06-13 MED ORDER — HYDROMORPHONE HCL 2 MG/ML IJ SOLN *WRAPPED*
0.5000 mg | Freq: Once | INTRAMUSCULAR | Status: AC
Start: 2017-06-14 — End: 2017-06-14
  Administered 2017-06-14: 0.5 mg via INTRAVENOUS
  Filled 2017-06-13: qty 1

## 2017-06-13 MED ORDER — HEPARIN INFUSION 100 UNITS/ML (NEUR PROTOCOL) *I*
0.0000 [IU]/h | INTRAVENOUS | Status: DC
Start: 2017-06-13 — End: 2017-06-14
  Administered 2017-06-13: 900 [IU]/h
  Administered 2017-06-13 (×2): 1050 [IU]/h
  Administered 2017-06-13: 1050 [IU]/h via INTRAVENOUS
  Administered 2017-06-13: 900 [IU]/h
  Administered 2017-06-13: 1050 [IU]/h via INTRAVENOUS
  Administered 2017-06-13: 900 [IU]/h
  Administered 2017-06-13: 900 [IU]/h via INTRAVENOUS
  Administered 2017-06-14: 1200 [IU]/h
  Administered 2017-06-14: 1050 [IU]/h
  Administered 2017-06-14: 1200 [IU]/h
  Administered 2017-06-14: 1050 [IU]/h via INTRAVENOUS
  Administered 2017-06-14 (×3): 1050 [IU]/h
  Filled 2017-06-13: qty 250

## 2017-06-13 MED ORDER — LORAZEPAM 2 MG/ML IJ SOLN *I*
0.5000 mg | Freq: Once | INTRAMUSCULAR | Status: AC
Start: 2017-06-14 — End: 2017-06-14
  Administered 2017-06-14: 0.5 mg via INTRAVENOUS
  Filled 2017-06-13: qty 1

## 2017-06-13 MED ORDER — LIDOCAINE HCL 1 % IJ SOLN *I*
1.0000 mL | Freq: Once | INTRAMUSCULAR | Status: AC | PRN
Start: 2017-06-13 — End: 2017-06-13
  Administered 2017-06-13: 1 mL via INTRADERMAL

## 2017-06-13 MED ORDER — SODIUM CHLORIDE 0.9 % INJ (FLUSH) WRAPPED *I*
10.0000 mL | Status: DC | PRN
Start: 2017-06-13 — End: 2017-08-24

## 2017-06-13 MED ORDER — MAGNESIUM SULFATE 2 GM IN 50 ML *WRAPPED*
2000.0000 mg | Freq: Once | INTRAVENOUS | Status: AC
Start: 2017-06-13 — End: 2017-06-13
  Administered 2017-06-13: 2000 mg via INTRAVENOUS
  Filled 2017-06-13: qty 50

## 2017-06-13 MED ORDER — HEPARIN INFUSION 100 UNITS/ML (NEUR PROTOCOL) *I*
0.0000 [IU]/h | INTRAVENOUS | Status: DC
Start: 2017-06-13 — End: 2017-06-13
  Administered 2017-06-13: 900 [IU]/h
  Administered 2017-06-13: 900 [IU]/h via INTRAVENOUS
  Administered 2017-06-13 (×2): 900 [IU]/h
  Filled 2017-06-13 (×2): qty 250

## 2017-06-13 MED ORDER — SODIUM CHLORIDE 0.9 % INJ (FLUSH) WRAPPED *I*
10.0000 mL | Freq: Three times a day (TID) | Status: DC | PRN
Start: 2017-06-13 — End: 2017-08-24

## 2017-06-13 MED ORDER — SODIUM CHLORIDE 0.9 % IV SOLN WRAPPED *I*
8.0000 mg/kg | INTRAVENOUS | Status: DC
Start: 2017-06-13 — End: 2017-06-18
  Administered 2017-06-13 – 2017-06-17 (×5): 600 mg via INTRAVENOUS
  Filled 2017-06-13 (×8): qty 12

## 2017-06-13 NOTE — Progress Notes (Addendum)
Surgical Oncology/Hepatobiliary Surgery Progress Note     LOS: 76 days     Subjective:  Interval Events:   Planning for perc G-tube placement w/ IR today. PICC placement after G tube.   No acute events overnight.     Objective:  Vitals Sign Ranges for Past 24 Hours:  BP: (100-132)/(70-72)   Temp:  [35.9 C (96.6 F)-36.7 C (98.1 F)]   Temp src: Temporal (04/11 0330)  Heart Rate:  [86-94]   Resp:  [16-18]   SpO2:  [91 %-98 %]     Physical Exam:  General Appearance: in NAD  Cardiac: regular rate  Respiratory: non-labored breathing on room air  Abdomen: Soft, non tender to palpation, non distended, incision well healed   Extremities: warm, erythema of the bilateral anterior calves with small amount of skin breakdown, minimal edema no posterior calf tenderness     Labs:    CBC:    Recent Labs  Lab 06/12/17  2331 06/12/17  0923 06/12/17  0345 06/11/17  0909 06/11/17  0201 06/10/17  0106 06/09/17  0401 06/07/17  2340   WBC 11.6*  --  10.4*  --  9.8 8.4 7.8 8.3   Hemoglobin 7.0*  --  7.1*  --  8.6* 7.7* 7.5* 8.1*   Hematocrit 23*  --  22*  --  28* 24* 25* 25*   Platelets 561* 537* 540* 507* 494* 488* 448* 235*       Metabolic Panel:    Recent Labs  Lab 06/12/17  2331 06/12/17  0345 06/11/17  0201 06/10/17  0106 06/09/17  0401 06/07/17  2340   Sodium 132* 133 132* 132*   131* 132* 132*   Potassium 5.1 4.4 4.6 4.0   4.0 4.0 4.1   Chloride 97 96 95* 95*   96 96 95*   CO2 _0 UN _1 Creatinine 0.53 0.54 0.56 0.57   0.57 0.59 0.73   Glucose 86 111* 119* 107*   105* 111* 119*   Calcium 8.4* 8.3* 8.2* 8.0*   8.2* 8.5* 8.2*   Magnesium 1.6  --   --  1.5*  --   --    Phosphorus 5.1*  --   --  4.3  --   --           Assessment:  72 y.o. female with h/o recent PE and pancreatic cancer POD # 19  status post whipple procedure complicated by delayed gastric emptying.  NGT replaced on 04/04/17. UGI on 04/12/17 concerning for obstruction just beyond the [place] in the efferent limb.  Course complicated  on 5/73/22 by rising LFTs and sepsis with CT concerning for cholangitis given biliary tree obstruction and PV compression due to hematoma and afferent limb distention in setting of PE treatment. Is s/p IR PTC on 04/16/17 and IR percutaneous aspiration of anterior abdominal fluid collection on 04/24/17. IR LUQ perc drain placement 3/1. IR anterior abdominal 61f perc drain placement 3/3. IR anterior perc drain into left hepatic collection 3/6.    Plan:  - Regular Diet with TF's. IR planning for G-tube placement today. Resume diet after procedure  - Plan to continue IV antibiotic regimen (Ertapenem and Daptomycin) per ID Recommendations. Duration until resolution of Hepatic Abscess.  Will need PICC line for long-term antibiotics, can be placed once BCxs have been negative for 48-hrs.   - Hyponatremia: Stable from previous.  -  Anemia; stable.  - Aggressive bowel regimen  - SCDs, restart lovenox after G tube today.  - Dispo: Pending clinical course, resolution of acute infection. Pt has Physical Debilitation requiring a rehab setting. PT/OT recommending SNF.    Cherylann Banas, MD  06/13/2017     6:39 AM   General Surgery Resident       HPB-GI Attending Addendum:   I personally examined the patient, reviewed the notes, and discussed the plan of care with the residents and patient. Agree with detailed resident's note. Please see the note above for details of history, exam, labs, assessment/plan which reflect my input.      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      Bacteremia with VRE. Mediport removed. IV antibiotics.    new cultures negative. PICC line in place.   Hyperglycemia with insulin sliding scale and recurrent need for insulin. continue   Post operative anemia. Stable. No need for transfusion  Pulmonary embolism. Present on admission. Heparin drip.  Hypoalbuminemia. Moderate malnutrition. Bolus tube  feedings at goal. Encouraging oral intake  Depression. Encouragement provided. Slightly improved. Ritalin helpful.   Constipation . Aggressive regimen.   Physical debility. Requires rehabilitation setting.      Delano Metz, MD, FACS  Hepatobiliary, Pancreas & GI Surgery

## 2017-06-13 NOTE — Consults (Signed)
PICC PRE-PROCEDURE ASSESSMENT     Patient Vascular History/Contraindications: Other right infected mediport removed.     Nephrology note required for PICC placement and provider contacted: No    Labs Reviewed:      Lab results: 06/12/17  2331   GFR,Caucasian 96   GFR,Black 111     No components found for: CREATININE      Lab results: 06/12/17  2331   WBC 11.6*           Lab results: 06/12/17  0345   INR CANCELED           Lab results: 06/12/17  2331   Platelets 561*            Extremity Assessment:  WNL    Recommendations: PICC Line    Refer to Radiology:   No

## 2017-06-13 NOTE — Progress Notes (Signed)
Palliative Care Progress Note    HPI: [name] w/ PMH fibromyalgia, pancreatic CA s/p Whipple for 'cure' 03/29/17; s/p ICU w/ intub for resp failure & septic shock, weaned from pressors and extubated to HFNC 2/17 w/ IR drainage bili fluid collection 2/19, s/p IV abx, now all abd drains for abscesses removed. Palliative following for symptoms, started methadone 2/19, weaned dose 2/23 d/t concern for sedation. Pt w/ ongoing poor po intake, now tolerating bolus fds via dubhoff fdg tube. Unfortunately not able to get PEG w/o going to OR (anticipate 4/11), s/p R pleural pigtail for ~ 1 L drainage, now removed and + VRE from mediport, now removed 4/8. S/p PICC placement, plan for PEG in OR. Goal dispo SNF Rehab.    Subjective: "It's been a rough day. I feel poorly physically and mentally, frustrated, depressed, apprehensive, sad and exhausted" (in relation to questions on phone app for relaxation)    Objective: chart reviewed, pt went for PICC today, unable to do PEG in IR d/t no window, now anticipate PEG in OR tomorrow; pt feeling tired out - introduced aromatherapy and relaxation, breathing exercises w/ meditation and body scan w/ fair response though pt willing to try and will continue to encourage for overall coping & stress reduction; validated experience    Palliative Care ROS:  Pain mild  Nausea no  Dyspnea no  Weakness yes  Constipation no  Delirum no  Last stool 4/11    Physical Examination:   BP: (98-132)/(56-70)   Temp:  [36 C (96.8 F)-36.5 C (97.7 F)]   Temp src: Temporal (04/11 1620)  Heart Rate:  [86-94]   Resp:  [16]   SpO2:  [91 %-98 %]   Gen appearance: [name], sitting up in recliner, feeling tired, no acute symptoms, mild L shoulder pain  Lungs: easy resp, NC  Extrem: mod LE edema, reddened skin LEs  Skin: warm, dry  Neuro:  A+O x 3; at baseline ambulates short distances w/ walker & standby assist -reports up and ambulated earlier today, poor appetite    Assessment/Plan: [name] w/  pancreatic CA, s/p Whipple in Jan. s/p TPN, s/p perc drains and IV abx w/ resolving abd abscesses, ongoing L pleural effusion. Pt started trial of bolus TFs if she does not eat at least 50% meal. Pt's need for dubhoff is barrier to discharge to SNF Rehab and may likely need LTC regardless. S/p PICC today and per notes, anticipate taking pt to OR to place PEG on Friday which would move forward her dispo planning and ability to get more intensive rehab.     Pain/dyspnea  Acetaminophen 1 gm po TID  Methadone 5/7.5/7.5 mg po TID (total 20 mg/d, last increased 3/11)  Dilaudid 2 mg po Q 4 hrs prn (x 2 on 4/10, x 2 on 4/11)  Lidocaine patch daily x 2 (mid back, LUE)  Encourage incentive spirometry prn     Anxiety/agitation/nausea/fatigue  Duloxetine 20 mg po daily  Reglan 10 mg po 4x/d (AC & QHS)  Pantoprazole 40 mg po BID  Calcium carbonate 1 gm po BID prn   Ondansetron 4 mg IV Q 6 hrs prn (x 1 on 4/10)  Ritalin 2.5 mg po at 8 am and 2.5 mg at 12 pm (reduced dose 3/26) per Phmd unlikely ritalin is cause of mild confusion or appetite suppression  Mirtazapine 7.5 mg po QHS (started 4/2)  Introduced aromatherapy personal inhaler "Alencia's blend" & "Stop, Think, Breathe" phone app for relaxation  Prevention of constipation, last stool 4/11  Colace 200 mg po BID  Miralax 17 gm po daily  Senna 2 tab po BID  Bisacodyl supp pr daily     Dry flaky edematous Skin  Bacitracin oint BID to BLEs  Eucerin prn for BLEs    GOC/prognosis  Full code  Dtr Rod Mae is HCP  Dispo plans now for SNF Rehab and may likely need LTC for a period in addition   4/1 initiating trial of PO meals first & bolus fds after if not eating at least 50% of meal.  Pt anticipated to get PEG fdg tube placed in OR 4/12    Pt case discussed w/ RN and covering provider.    We will continue to follow along, please call if questions/concerns.     Total Time Spent 35 minutes: >50% of time was spent in counseling and/or coordination of care.     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    Hypothyroidism E03.9    Pancreatic cancer C25.9    Anemia D64.9     hx of Pulmonary embolism I26.99    Depression F32.9    Sepsis A41.9       No Known Allergies (drug, envir, food or latex)    Scheduled Meds:    [START ON 06/14/2017] LORazepam  0.5 mg Intravenous Once    [START ON 06/14/2017] HYDROmorphone PF  0.5 mg Intravenous Once    daptomycin IV  8 mg/kg Intravenous Q24H    methylphenidate  2.5 mg Oral BID    polyethylene glycol  17 g Per G Tube Daily    ertapenem  1,000 mg Intravenous Q24H    insulin glargine  4 Units Subcutaneous Nightly    lidocaine  1 patch Transdermal Q24H    methadone  5 mg Oral Daily    triamterene-hydrochlorothiazide  1 tablet Oral QAM    bacitracin zinc-polymyxin B sulfate   Topical 2 times per day    lidocaine  1 patch Transdermal Q24H    fluconazole  800 mg Oral Daily    pantoprazole  40 mg Oral BID AC    senna  2 tablet Oral 2 times per day    bisacodyl  10 mg Rectal Daily    DULoxetine  20 mg Oral Daily    metoclopramide  10 mg Oral 4x Daily AC & HS    docusate sodium  200 mg Oral 2 times per day    acetaminophen  1,000 mg Oral Q8H    levothyroxine  137 mcg Oral Daily       Continuous Infusions:    heparin 900 Units/hr (06/13/17 1654)    sodium chloride      sodium chloride 100 mL/hr (06/13/17 1654)       PRN Meds:  sodium chloride, sodium chloride, sodium chloride, dextrose, ibuprofen, HYDROmorphone, naloxone, sodium chloride, dextrose, calcium carbonate, heparin lock flush, ondansetron

## 2017-06-13 NOTE — Plan of Care (Signed)
Problem: Safety  Goal: Patient will remain free of falls  Outcome: Progressing towards goal      Problem: Pain/Comfort  Goal: Patient's pain or discomfort is manageable  Outcome: Progressing towards goal      Problem: Mobility  Goal: Functional status is maintained or improved - Geriatric  Outcome: Progressing towards goal    Goal: Patient's functional status is maintained or improved  Outcome: Progressing towards goal      Problem: Nutrition  Goal: Patient's nutritional status is maintained or improved  Outcome: Progressing towards goal    Goal: Nutritional status is maintained or improved - Geriatric  Outcome: Progressing towards goal

## 2017-06-13 NOTE — Progress Notes (Signed)
Writer called IR for barium in preparation for PEG tube placement 4/12. Per IR from provider. Pt does not need barium for tonight.  Karmen Stabs, RN

## 2017-06-13 NOTE — Progress Notes (Signed)
Report Given   Amanda Corning, RN Received Handoff Report from Michaele Offer RN for IR procedure 9:38 AM          Procedure Summary  [employer] Nursing Procedure Note    Jazlin Tapscott  [id number]    picc       Status: Completed    Patient tolerated procedure well     Specimen Collection: yes    Sponge count: N/A    Fluid Removed:N/A  Procedure Dressing Site located:Left Arm  Dressing Type:Occlusive  Biopatch:yes  Dressing status:Clean, dry and intact   Hematoma:Not evident  Medication received:1% LIDOCAINE  Cardiovascular:   Peripheral Pulses: Present post procedure      Fistula: N/A    Neuro Assessment:Patient is at pre-procedure baseline    Implant patient information given to patient or parent/guardian:N/A    Report given to:Unit Nurse. Unit [facility] Name Levada Dy RN       Last Filed Vitals    06/13/17 0627   BP:    Pulse:    Resp: 16   Temp:    SpO2:

## 2017-06-13 NOTE — Plan of Care (Signed)
Bowel Elimination     Elimination patterns are normal or improving Maintaining        Fluid and Electrolyte Imbalance     Fluid and Electrolyte imbalance Maintaining        GI Bleeding Elimination     Elimination of patterns are normal or improving Maintaining        Mobility     Functional status is maintained or improved - Geriatric Maintaining     Patient's functional status is maintained or improved Maintaining        Nutrition     Patient's nutritional status is maintained or improved Maintaining     Nutritional status is maintained or improved - Geriatric Maintaining        Pain/Comfort     Patient's pain or discomfort is manageable Maintaining        Post-Operative Bowel Elimination     Elimination pattern is normal or improving Maintaining        Post-Operative Hemodynamic Stability     Maintain Hemodynamic Stability Maintaining        Psychosocial     Demonstrates ability to cope with illness Maintaining        Safety     Patient will remain free of falls Maintaining

## 2017-06-13 NOTE — Progress Notes (Signed)
Alisa Haugh -PA -covering provider informed IR is not doing g-tube procedure due to no window -please obtain KUB tomorrow in AM. NPO after midnight.  Rubin Payor, RN

## 2017-06-13 NOTE — Procedures (Signed)
PICC PROCEDURE NOTE    Lot #[id number]                                          Size:5 French Double Lumen PICC  Side:left, right infected mediport removed.     Time out documentation completed prior to procedure:  Yes    Indications  Long Term Abx  Recommendations : PICC Line       Procedure Details     Procedure Date :06/13/2017    Ultrasound used:yes  Modified Seldinger technique used.    Catheter exchange: no  Vein Accessed: basilic  Venipuncture attempts & sites: 1 RUE   Guide Wire advancement: easy  Catheter Advancement: easy  Switched arms: no  Procedure: successful  Guide Wire Removed : yes  Refer to Radiology :No  Name of Provider Contacted :     Findings    Catheter inserted to 40 cm, with 2 cm exposed.  Mid upper arm circumference is 30cm.    Complications  none    Condition    Pre-Procedure PICC Site Pain Assessment: Scale used: Numeric Score: 0 Time: 0900   Post-Procedure PICC Site Pain Assessment: Scale used: NumericScore: 1 Time 0930 discomfort subsiding  Comfort Measures Provided : Pain Medication as Ordered by Provider     Plan/Orders    PICC tip verified by ECG Tip Confirmation System: Yes  ECG rhythm strip placed in chart and uploaded to patient [place]  VBG WNL: Yes  STAT Portable Chest X-Ray Ordered :N/A    Other :   Floor Nurse Levada Dy, RN aware that OK to Use PICC order must be placed prior to use of PICC.    EBL  Estimated Blood Loss : Minimal    Disposition  Pt tolerated well.     Mendel Corning, RN  06/13/2017  9:29 AM

## 2017-06-13 NOTE — Progress Notes (Signed)
Interventional Radiology Pre-Procedure Handoff/Checklist    NPO: [x] YES [] NO [] N/A     Barium was administered last HS    Anticoagulants:heparin drip off at 0500    Telemetry: [] YES [x] NO [] N/A     Transport mode: Bed  Number of Transporters needed: 2 -Hover      IV access:   Peripheral IV 06/11/17 0900 Right Wrist (Active)   Phlebitis Scale Grade 0 06/13/2017  1:12 AM   Infiltration Scale Grade 0 06/13/2017  1:12 AM   Line Status Infusing 06/13/2017  1:12 AM   Dressing Type Transparent 06/13/2017  1:12 AM   Dressing Status Clean, dry and intact 06/13/2017  1:12 AM       Peripheral IV 06/11/17 1350 Ventral;Right Forearm (proximal third) (Active)   Phlebitis Scale Grade 0 06/13/2017  1:12 AM   Infiltration Scale Grade 0 06/13/2017  1:12 AM   Line Status Infusing 06/13/2017  1:12 AM   Dressing Type Transparent 06/13/2017  1:12 AM   Dressing Status Clean, dry and intact 06/13/2017  1:12 AM       Respiratory: Room Air    Consentable:yes    Alert and Oriented to person, place and time?: yes    Code Status:full     Does patient wear an insulin pump? [] YES [x] NO [] N/A     Precautions:none    Allergies: No Known Allergies (drug, envir, food or latex)    Rubin Payor, RN Received Handoff Report from [place] for IR procedure 7:59 AM

## 2017-06-14 ENCOUNTER — Other Ambulatory Visit: Payer: Self-pay | Admitting: Cardiology

## 2017-06-14 ENCOUNTER — Inpatient Hospital Stay: Payer: Medicare (Managed Care)

## 2017-06-14 DIAGNOSIS — Z431 Encounter for attention to gastrostomy: Secondary | ICD-10-CM

## 2017-06-14 DIAGNOSIS — R000 Tachycardia, unspecified: Secondary | ICD-10-CM

## 2017-06-14 LAB — CBC AND DIFFERENTIAL
Baso # K/uL: 0.1 10*3/uL (ref 0.0–0.1)
Basophil %: 0.8 %
Eos # K/uL: 0.5 10*3/uL — ABNORMAL HIGH (ref 0.0–0.4)
Eosinophil %: 4.2 %
Hematocrit: 24 % — ABNORMAL LOW (ref 34–45)
Hemoglobin: 7.4 g/dL — ABNORMAL LOW (ref 11.2–15.7)
IMM Granulocytes #: 0.2 10*3/uL
IMM Granulocytes: 1.5 %
Lymph # K/uL: 3 10*3/uL (ref 1.2–3.7)
Lymphocyte %: 27.2 %
MCH: 28 pg/cell (ref 26–32)
MCHC: 31 g/dL — ABNORMAL LOW (ref 32–36)
MCV: 88 fL (ref 79–95)
Mono # K/uL: 0.9 10*3/uL (ref 0.2–0.9)
Monocyte %: 8.7 %
Neut # K/uL: 6.2 10*3/uL — ABNORMAL HIGH (ref 1.6–6.1)
Nucl RBC # K/uL: 0 10*3/uL (ref 0.0–0.0)
Nucl RBC %: 0 /100 WBC (ref 0.0–0.2)
Platelets: 585 10*3/uL — ABNORMAL HIGH (ref 160–370)
RBC: 2.7 MIL/uL — ABNORMAL LOW (ref 3.9–5.2)
RDW: 16.8 % — ABNORMAL HIGH (ref 11.7–14.4)
Seg Neut %: 57.6 %
WBC: 10.8 10*3/uL — ABNORMAL HIGH (ref 4.0–10.0)

## 2017-06-14 LAB — FUNGUS CULTURE: Fungal Culture: 0

## 2017-06-14 LAB — BASIC METABOLIC PANEL
Anion Gap: 14 (ref 7–16)
CO2: 23 mmol/L (ref 20–28)
Calcium: 8.3 mg/dL — ABNORMAL LOW (ref 8.6–10.2)
Chloride: 95 mmol/L — ABNORMAL LOW (ref 96–108)
Creatinine: 0.53 mg/dL (ref 0.51–0.95)
GFR,Black: 111 *
GFR,Caucasian: 96 *
Glucose: 103 mg/dL — ABNORMAL HIGH (ref 60–99)
Lab: 10 mg/dL (ref 6–20)
Potassium: 5 mmol/L (ref 3.3–5.1)
Sodium: 132 mmol/L — ABNORMAL LOW (ref 133–145)

## 2017-06-14 LAB — POCT GLUCOSE
Glucose POCT: 103 mg/dL — ABNORMAL HIGH (ref 60–99)
Glucose POCT: 117 mg/dL — ABNORMAL HIGH (ref 60–99)

## 2017-06-14 LAB — CRP: CRP: 157 mg/L — ABNORMAL HIGH (ref 0–10)

## 2017-06-14 LAB — APTT
aPTT: 37.3 s (ref 25.8–37.9)
aPTT: 32.1 s (ref 25.8–37.9)

## 2017-06-14 LAB — BLOOD CULTURE: Bacterial Blood Culture: 0

## 2017-06-14 LAB — PLATELET COUNT: Platelets: 647 10*3/uL — ABNORMAL HIGH (ref 160–370)

## 2017-06-14 LAB — PROTIME-INR
INR: 1.3 — ABNORMAL HIGH (ref 0.9–1.1)
Protime: 14.7 s — ABNORMAL HIGH (ref 10.0–12.9)

## 2017-06-14 MED ORDER — LIDOCAINE HCL (PF) 1 % IJ SOLN *I*
INTRAMUSCULAR | Status: AC | PRN
Start: 2017-06-14 — End: 2017-06-14
  Administered 2017-06-14: 30 mL via SUBCUTANEOUS

## 2017-06-14 MED ORDER — METOCLOPRAMIDE HCL 5 MG/ML IJ SOLN *I*
5.0000 mg | Freq: Four times a day (QID) | INTRAMUSCULAR | Status: AC
Start: 2017-06-14 — End: 2017-06-16
  Administered 2017-06-14 – 2017-06-16 (×8): 5 mg via INTRAVENOUS
  Filled 2017-06-14 (×8): qty 2

## 2017-06-14 MED ORDER — FLUCONAZOLE 400 MG IN 200ML NACL IV SOLN *I*
800.0000 mg | INTRAVENOUS | Status: DC
Start: 2017-06-14 — End: 2017-06-28
  Administered 2017-06-14 – 2017-06-27 (×14): 800 mg via INTRAVENOUS
  Filled 2017-06-14 (×14): qty 400

## 2017-06-14 MED ORDER — METHADONE HCL 5 MG PO TABS *I*
5.0000 mg | ORAL_TABLET | Freq: Three times a day (TID) | ORAL | Status: DC
Start: 2017-06-14 — End: 2017-06-18
  Administered 2017-06-15 – 2017-06-17 (×8): 5 mg via ORAL
  Filled 2017-06-14 (×9): qty 1

## 2017-06-14 MED ORDER — PROMETHAZINE HCL 25 MG/ML IJ SOLN *I*
12.5000 mg | Freq: Four times a day (QID) | INTRAMUSCULAR | Status: DC | PRN
Start: 2017-06-14 — End: 2017-07-08
  Administered 2017-06-14 – 2017-06-17 (×2): 12.5 mg via INTRAVENOUS
  Filled 2017-06-14 (×2): qty 1

## 2017-06-14 MED ORDER — ONDANSETRON HCL 2 MG/ML IV SOLN *I*
INTRAMUSCULAR | Status: AC | PRN
Start: 2017-06-14 — End: 2017-06-14
  Administered 2017-06-14: 4 mg via INTRAVENOUS

## 2017-06-14 MED ORDER — HYDROMORPHONE HCL 2 MG PO TABS *I*
2.0000 mg | ORAL_TABLET | ORAL | Status: DC | PRN
Start: 2017-06-15 — End: 2017-06-17
  Administered 2017-06-15 – 2017-06-17 (×9): 2 mg via ORAL
  Filled 2017-06-14 (×10): qty 1

## 2017-06-14 MED ORDER — LIDOCAINE-EPINEPHRINE 1 %-1:100000 IJ SOLN *I*
INTRAMUSCULAR | Status: AC
Start: 2017-06-14 — End: 2017-06-14
  Filled 2017-06-14: qty 50

## 2017-06-14 MED ORDER — AQUAPHOR EX OINT *I*
TOPICAL_OINTMENT | CUTANEOUS | Status: DC | PRN
Start: 2017-06-14 — End: 2017-08-20
  Filled 2017-06-14: qty 50

## 2017-06-14 MED ORDER — GLUCAGON HCL (RDNA) 1 MG IJ SOLR *WRAPPED*
INTRAMUSCULAR | Status: AC | PRN
Start: 2017-06-14 — End: 2017-06-14
  Administered 2017-06-14: 1 mg via INTRAVENOUS

## 2017-06-14 MED ORDER — GLUCAGON HCL (RDNA) 1 MG IJ SOLR *WRAPPED*
INTRAMUSCULAR | Status: AC
Start: 2017-06-14 — End: 2017-06-14
  Filled 2017-06-14: qty 1

## 2017-06-14 MED ORDER — HEPARIN INFUSION 100 UNITS/ML (NEUR PROTOCOL) *I*
0.0000 [IU]/h | INTRAVENOUS | Status: DC
Start: 2017-06-14 — End: 2017-06-16
  Administered 2017-06-14: 900 [IU]/h
  Administered 2017-06-14: 900 [IU]/h via INTRAVENOUS
  Administered 2017-06-14 – 2017-06-15 (×3): 900 [IU]/h
  Administered 2017-06-15: 1050 [IU]/h
  Administered 2017-06-15: 1200 [IU]/h
  Administered 2017-06-15: 1050 [IU]/h
  Administered 2017-06-15: 1200 [IU]/h
  Administered 2017-06-15: 1050 [IU]/h
  Administered 2017-06-15 (×3): 1200 [IU]/h
  Administered 2017-06-15: 1350 [IU]/h
  Administered 2017-06-15: 1050 [IU]/h
  Administered 2017-06-15: 900 [IU]/h
  Administered 2017-06-15: 1050 [IU]/h
  Administered 2017-06-15: 1350 [IU]/h
  Administered 2017-06-15: 1350 [IU]/h via INTRAVENOUS
  Administered 2017-06-15: 1200 [IU]/h via INTRAVENOUS
  Administered 2017-06-15: 1050 [IU]/h via INTRAVENOUS
  Administered 2017-06-15: 1200 [IU]/h
  Administered 2017-06-16: 1650 [IU]/h
  Administered 2017-06-16 (×2): 1500 [IU]/h
  Administered 2017-06-16: 1650 [IU]/h
  Administered 2017-06-16: 1500 [IU]/h
  Administered 2017-06-16: 1500 [IU]/h via INTRAVENOUS
  Administered 2017-06-16: 1350 [IU]/h
  Administered 2017-06-16: 1650 [IU]/h
  Administered 2017-06-16 (×3): 1500 [IU]/h
  Administered 2017-06-16: 1650 [IU]/h
  Administered 2017-06-16: 1500 [IU]/h
  Administered 2017-06-16: 1500 [IU]/h via INTRAVENOUS
  Filled 2017-06-14 (×4): qty 250

## 2017-06-14 MED ORDER — LACTOBACILLUS RHAMNOSUS (GG) PO CAPS *I*
1.0000 | ORAL_CAPSULE | Freq: Every day | ORAL | Status: DC
Start: 2017-06-14 — End: 2017-06-25
  Administered 2017-06-17: 1 via ORAL
  Filled 2017-06-14 (×11): qty 1

## 2017-06-14 MED ORDER — MIDAZOLAM HCL 1 MG/ML IJ SOLN *I* WRAPPED
INTRAMUSCULAR | Status: AC
Start: 2017-06-14 — End: 2017-06-14
  Filled 2017-06-14: qty 2

## 2017-06-14 MED ORDER — HYDROMORPHONE HCL 2 MG/ML IJ SOLN *WRAPPED*
0.5000 mg | Freq: Once | INTRAMUSCULAR | Status: AC
Start: 2017-06-14 — End: 2017-06-14
  Administered 2017-06-14: 0.5 mg via INTRAVENOUS
  Filled 2017-06-14: qty 1

## 2017-06-14 MED ORDER — LIDOCAINE HCL 1 % IJ SOLN *I*
INTRAMUSCULAR | Status: AC
Start: 2017-06-14 — End: 2017-06-14
  Filled 2017-06-14: qty 20

## 2017-06-14 MED ORDER — MIDAZOLAM HCL 1 MG/ML IJ SOLN *I* WRAPPED
INTRAMUSCULAR | Status: AC | PRN
Start: 2017-06-14 — End: 2017-06-14
  Administered 2017-06-14: .5 mg via INTRAVENOUS
  Administered 2017-06-14: 1 mg via INTRAVENOUS
  Administered 2017-06-14: .5 mg via INTRAVENOUS
  Administered 2017-06-14: 1 mg via INTRAVENOUS

## 2017-06-14 MED ORDER — ERTAPENEM IN NS 1 GM *I*
1000.0000 mg | INTRAVENOUS | Status: DC
Start: 2017-06-14 — End: 2017-07-04
  Administered 2017-06-14 – 2017-07-03 (×20): 1000 mg via INTRAVENOUS
  Filled 2017-06-14 (×20): qty 50

## 2017-06-14 MED ORDER — SULFAMETHOXAZOLE-TRIMETHOPRIM 800-160 MG PO TABS *I*
1.0000 | ORAL_TABLET | Freq: Two times a day (BID) | ORAL | Status: DC
Start: 2017-06-14 — End: 2017-06-14
  Filled 2017-06-14 (×3): qty 1

## 2017-06-14 MED ORDER — FENTANYL CITRATE 50 MCG/ML IJ SOLN *WRAPPED*
INTRAMUSCULAR | Status: AC
Start: 2017-06-14 — End: 2017-06-14
  Filled 2017-06-14: qty 2

## 2017-06-14 MED ORDER — ONDANSETRON HCL 2 MG/ML IV SOLN *I*
INTRAMUSCULAR | Status: AC
Start: 2017-06-14 — End: 2017-06-14
  Filled 2017-06-14: qty 2

## 2017-06-14 MED ORDER — CEFAZOLIN 1000 MG IN STERILE WATER 10ML SYRINGE *I*
PREFILLED_SYRINGE | INTRAVENOUS | Status: AC | PRN
Start: 2017-06-14 — End: 2017-06-14
  Administered 2017-06-14: 1 g via INTRAVENOUS

## 2017-06-14 MED ORDER — HYDROMORPHONE HCL 2 MG/ML IJ SOLN *WRAPPED*
1.0000 mg | Freq: Once | INTRAMUSCULAR | Status: AC
Start: 2017-06-14 — End: 2017-06-14
  Administered 2017-06-14: 1 mg via INTRAVENOUS
  Filled 2017-06-14: qty 1

## 2017-06-14 MED ORDER — HYDROMORPHONE HCL 2 MG/ML IJ SOLN *WRAPPED*
0.5000 mg | INTRAMUSCULAR | Status: DC | PRN
Start: 2017-06-14 — End: 2017-06-17
  Administered 2017-06-14 – 2017-06-17 (×20): 0.5 mg via INTRAVENOUS
  Filled 2017-06-14 (×20): qty 1

## 2017-06-14 MED ORDER — FERROUS SULFATE 325 (65 FE) MG PO TABS *WRAPPED* *I*
325.0000 mg | ORAL_TABLET | Freq: Every day | ORAL | Status: DC
Start: 2017-06-15 — End: 2017-06-25
  Administered 2017-06-17: 325 mg via ORAL
  Filled 2017-06-14 (×11): qty 1

## 2017-06-14 MED ORDER — FENTANYL CITRATE 50 MCG/ML IJ SOLN *WRAPPED*
INTRAMUSCULAR | Status: AC | PRN
Start: 2017-06-14 — End: 2017-06-14
  Administered 2017-06-14: 50 ug via INTRAVENOUS
  Administered 2017-06-14: 25 ug via INTRAVENOUS
  Administered 2017-06-14: 50 ug via INTRAVENOUS
  Administered 2017-06-14: 25 ug via INTRAVENOUS

## 2017-06-14 MED ORDER — LIDOCAINE-EPINEPHRINE 1 %-1:100000 IJ SOLN *I*
INTRAMUSCULAR | Status: AC | PRN
Start: 2017-06-14 — End: 2017-06-14
  Administered 2017-06-14: 10 mL

## 2017-06-14 MED ORDER — PANTOPRAZOLE SODIUM 40 MG IV SOLR *I*
40.0000 mg | Freq: Two times a day (BID) | INTRAVENOUS | Status: AC
Start: 2017-06-14 — End: 2017-06-16
  Administered 2017-06-14 – 2017-06-16 (×4): 40 mg via INTRAVENOUS
  Filled 2017-06-14 (×4): qty 10

## 2017-06-14 MED ORDER — CEFAZOLIN 1000 MG IN STERILE WATER 10ML SYRINGE *I*
PREFILLED_SYRINGE | INTRAVENOUS | Status: AC
Start: 2017-06-14 — End: 2017-06-14
  Filled 2017-06-14: qty 10

## 2017-06-14 NOTE — Procedures (Signed)
Procedure Report    Please enter procedure, pre- and and post-procedure diagnoses in fields above and removed this line of text.    PROCEDURE NOTE    Time out documentation completed in Procedure navigator prior to procedure:  Yes    Indications  Poor oral intake     Procedure Details  Placement of 18 Fr Feeding tube.       Guide Wire Removed : yes    Findings  Successful placement of Feeding Tube     Complications  None     Condition  good    Plan/Orders  Do not use G tube till authorized.     EBL: Minimal    Specimens  no    Disposition  Back to wards    Argentina Ponder, MD  06/14/2017  5:54 PM

## 2017-06-14 NOTE — Progress Notes (Signed)
Interventional Radiology Pre-Procedure Handoff/Checklist     NPO: [x] YES [] NO [] N/A     Tube feed Stopped: [] YES [] NO [x] N/A     Anticoagulants:heparin gtt stopped at 0530    Telemetry: [] YES [x] NO [] N/A     Transport mode: Stretcher  Number of Transporters needed: 1      IV access:   PICC Double Lumen (Power) 06/13/17 Clamped Left Upper Extremity (Active)   Phlebitis Scale Grade 0 06/14/2017  3:00 AM   Infiltration Scale Grade 0 06/14/2017  3:00 AM   Proximal Lumen Status Flushed;Blood return noted;Infusing 06/14/2017  3:00 AM   Distal Lumen Status Blood return noted;Flushed;Capped 06/14/2017  3:00 AM   External Length Check (cm) 2 cm 06/13/2017  9:33 AM   AC Circumference (cm) 27 cm 06/13/2017  9:33 AM   Upper Arm Circumference (cm) 30 cm 06/13/2017  9:33 AM   Dressing Type Transparent 06/14/2017  3:00 AM   Dressing Status Clean, dry and intact 06/14/2017  3:00 AM   (T)Transparent Drsg Change-Q7D Dressing changed 06/13/2017  9:33 AM   Dressing Change Due 06/20/17 06/14/2017  3:00 AM   Bag to hub changed? No 06/14/2017  3:00 AM   Next bag to hub change due 06/17/17 06/14/2017  3:00 AM   Cap(s) Changed? No 06/14/2017  3:00 AM   Next cap change due 06/17/17 06/14/2017  3:00 AM   **Need for continuing central line addressed? Yes - central line still necessary 06/14/2017  3:00 AM       Peripheral IV 06/11/17 0900 Right Wrist (Active)   Phlebitis Scale Grade 0 06/14/2017  3:00 AM   Infiltration Scale Grade 0 06/14/2017  3:00 AM   Line Status Capped;Flushed 06/14/2017  3:00 AM   Dressing Type Transparent 06/14/2017  3:00 AM   Dressing Status Clean, dry and intact 06/14/2017  3:00 AM       Peripheral IV 06/11/17 1350 Ventral;Right Forearm (proximal third) (Active)   Phlebitis Scale Grade 0 06/14/2017  3:00 AM   Infiltration Scale Grade 0 06/14/2017  3:00 AM   Line Status Capped;Flushed 06/14/2017  3:00 AM   Dressing Type Transparent 06/14/2017  3:00 AM   Dressing Status Clean, dry and intact 06/14/2017  3:00 AM       Respiratory: Room  Air    Consentable:yes    Alert and Oriented to person, place and time?: yes    Code Status:Full     Does patient wear an insulin pump? [] YES [] NO [x] N/A      Precautions:none    Allergies: No Known Allergies (drug, envir, food or latex)    Larita Fife, RN Received Handoff Report from RN for IR procedure 10:04 AM

## 2017-06-14 NOTE — Progress Notes (Signed)
Palliative Care Progress Note    HPI: [name] w/ PMH fibromyalgia, pancreatic CA s/p Whipple for 'cure' 03/29/17; s/p ICU w/ intub for resp failure & septic shock, weaned from pressors and extubated to HFNC 2/17 w/ IR drainage bili fluid collection 2/19, s/p IV abx, now all abd drains for abscesses removed. Palliative following for symptoms, started methadone 2/19, weaned dose 2/23 d/t concern for sedation. Pt w/ ongoing poor po intake, now tolerating bolus fds via dubhoff fdg tube. Unfortunately not able to get PEG w/o going to OR (anticipate 4/11), s/p R pleural pigtail for ~ 1 L drainage, now removed and + VRE from mediport, now removed 4/8. S/p PICC placement, plan for reattempt PEG in IR on 4/12. Goal dispo SNF Rehab.    Subjective: "I'm hungry, I haven't had anything to eat."    Objective: chart reviewed, pt case discussed w/ Caitlin Phmd and bedside RN; pt's oral methadone held w/ NPO in anticipation of reattempt to place PEG in IR; MAR reviewed and pt has missed doses of methadone for past 3 days - using more prn dilaudid but likely would tolerate Methadone dose reduction from 20 mg to 15 mg/d = advised covering provider and bedside RN that new dose for methadone when able to take PO again will be Methadone 5 mg po TID and in meantime she can use dilaudid prn for breakthrough pain    Palliative Care ROS:  Pain mild  Nausea no  Dyspnea no  Weakness yes  Constipation no  Delirum no  Last stool 4/11    Physical Examination:   BP: (102-124)/(60-80)   Temp:  [36 C (96.8 F)-36.4 C (97.5 F)]   Temp src: Temporal (04/12 1211)  Heart Rate:  [91-98]   Resp:  [14-16]   SpO2:  [96 %-98 %]   Gen appearance: [name], sitting up in recliner, feeling tired, no acute symptoms, endorses mild L shoulder pain - lido patch   Lungs: easy resp, RA  Extrem: mod LE edema, reddened skin LEs  Skin: warm, dry  Neuro:  A+O x 3; at baseline ambulates short distances w/ walker & standby assist -reports up and ambulated  earlier today, feeling hungry today being NPO again in anticipation of 3rd attempt for PEG placement in IR    Assessment/Plan: [name] w/ pancreatic CA, s/p Whipple in Jan. s/p TPN, s/p perc drains and IV abx w/ resolving abd abscesses, s/p pigtail L pleural effusion, s/p mediport removal d/t VRE bacteremia & PICC placement. Reattempting PEG placement in IR today which would move forward her dispo planning and ability to get more intensive rehab. She may ultimately need PEG placed in OR if still no window found to place PEG in IR.    Pain/dyspnea  Acetaminophen 1 gm po TID  Methadone  5 mg po TID (hold for sedation)  Dilaudid 2 mg po Q 4 hrs prn (x 3 on 4/11, x 1 on 4/12)  Lidocaine patch daily x 2 (mid back, LUE)  Encourage incentive spirometry prn     Anxiety/agitation/nausea/fatigue  Duloxetine 20 mg po daily  Reglan 10 mg po 4x/d (AC & QHS)  Pantoprazole 40 mg po BID  Calcium carbonate 1 gm po BID prn   Ondansetron 4 mg IV Q 6 hrs prn (x 1 on 4/10)  Ritalin 2.5 mg po at 8 am and 2.5 mg at 12 pm (reduced dose 3/26) per Phmd unlikely ritalin is cause of mild confusion or appetite suppression  Mirtazapine  7.5 mg po QHS (started 4/2)  S/p introduction of aromatherapy personal inhaler "Seerat's blend" & "Stop, Think, Breathe" phone app for relaxation    Prevention of constipation, last stool 4/11  Colace 200 mg po BID  Miralax 17 gm po daily  Senna 2 tab po BID  Bisacodyl supp pr daily     Dry flaky edematous Skin  Bacitracin oint BID to BLEs  Eucerin prn for BLEs    GOC/prognosis  Full code  Dtr Rod Mae is HCP  Dispo plans now for SNF Rehab and may likely need LTC for a period in addition   4/1 initiating trial of PO meals first & bolus fds after if not eating at least 50% of meal.  Pt anticipated to get PEG fdg tube (reattempting in IR 4/12)    Pt case discussed w/ RN and covering provider aware of rec's re methadone dosing.    We will continue to follow along, please call if questions/concerns.     Total  Time Spent 35 minutes: >50% of time was spent in counseling and/or coordination of care.     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    Hypothyroidism E03.9    Pancreatic cancer C25.9    Anemia D64.9     hx of Pulmonary embolism I26.99    Depression F32.9    Sepsis A41.9       No Known Allergies (drug, envir, food or latex)    Scheduled Meds:    sulfamethoxazole-trimethoprim  1 tablet Oral 2 times per day    [START ON 06/15/2017] ferrous sulfate  325 mg Oral Daily with breakfast    lactobacillus rhamnosus (GG)  1 capsule Oral Daily    daptomycin IV  8 mg/kg Intravenous Q24H    methylphenidate  2.5 mg Oral BID    polyethylene glycol  17 g Per G Tube Daily    insulin glargine  4 Units Subcutaneous Nightly    lidocaine  1 patch Transdermal Q24H    methadone  5 mg Oral Daily    triamterene-hydrochlorothiazide  1 tablet Oral QAM    bacitracin zinc-polymyxin B sulfate   Topical 2 times per day    lidocaine  1 patch Transdermal Q24H    fluconazole  800 mg Oral Daily    pantoprazole  40 mg Oral BID AC    senna  2 tablet Oral 2 times per day    bisacodyl  10 mg Rectal Daily    DULoxetine  20 mg Oral Daily    metoclopramide  10 mg Oral 4x Daily AC & HS    docusate sodium  200 mg Oral 2 times per day    acetaminophen  1,000 mg Oral Q8H    levothyroxine  137 mcg Oral Daily       Continuous Infusions:    sodium chloride      sodium chloride Stopped (06/14/17 1351)       PRN Meds:  sodium chloride, sodium chloride, sodium chloride, dextrose, ibuprofen, HYDROmorphone, naloxone, sodium chloride, dextrose, calcium carbonate, heparin lock flush, ondansetron

## 2017-06-14 NOTE — Progress Notes (Addendum)
Infectious Diseases (Team 3) Follow Up Note    Transferred back to ID Team 3 from Fellow Team (ie, Dr. Attar/Dr Kandis Mannan attending)    Personally reviewed chart, labs, medications. Patient seen.     CC:  F/U VRE bacteremia, Liver and abdominal abscesses  F/U Enterobacter cloacae bacteremia, Intra-abdominal abscesses positive for VRE, enterobacter and candida glabrata    Subjective:  Feeling discourage and frightened of the course she's been on, worried about where she's headed.  "will I ever make it out of here?".  Is concerned with potential for pain with PEG tube once placed.  Denies F/C, denies diarrhea, has some intermittent mild nausea, reports unable to eat much 2/2 early satiety and lack of appetite. Has abdominal discomfort/tenderness.      ROS: Reviewed 6 systems in detail, see above    Current Meds:  Scheduled Meds:   sulfamethoxazole-trimethoprim  1 tablet Oral 2 times per day    [START ON 06/15/2017] ferrous sulfate  325 mg Oral Daily with breakfast    lactobacillus rhamnosus (GG)  1 capsule Oral Daily    methadone  5 mg Oral 3 times per day    daptomycin IV  8 mg/kg Intravenous Q24H    methylphenidate  2.5 mg Oral BID    polyethylene glycol  17 g Per G Tube Daily    lidocaine  1 patch Transdermal Q24H    triamterene-hydrochlorothiazide  1 tablet Oral QAM    lidocaine  1 patch Transdermal Q24H    fluconazole  800 mg Oral Daily    pantoprazole  40 mg Oral BID AC    senna  2 tablet Oral 2 times per day    bisacodyl  10 mg Rectal Daily    DULoxetine  20 mg Oral Daily    metoclopramide  10 mg Oral 4x Daily AC & HS    docusate sodium  200 mg Oral 2 times per day    acetaminophen  1,000 mg Oral Q8H    levothyroxine  137 mcg Oral Daily     Continuous Infusions:   sodium chloride      sodium chloride Stopped (06/14/17 1351)     PRN Meds:.   calcium carbonate  1,000 mg Oral BID PRN    HYDROmorphone PF  0.5 mg Intravenous Q1H PRN    Or    HYDROmorphone PF  1 mg Intravenous Q1H PRN     LORazepam  0.5 mg Oral Q6H PRN    heparin lock flush  50 Units Intracatheter PRN    ondansetron  4 mg Intravenous Q6H PRN       Objective:    BP: (102-130)/(55-85)   Temp:  [35.9 C (96.6 F)-36.4 C (97.5 F)]   Temp src: Temporal (04/12 1638)  Heart Rate:  [91-115]   Resp:  [14-30]   SpO2:  [92 %-100 %]     General Appearance:  Appears and voices worried about PEG tube placement this afternoon   HEENT:MMM, oropharynx clear; Dobhoff in place  Pulm/Chest: few crackles left base, otherwise CTA.  Clean/dry dressing right chest wall covering s/p mediport site with sutures intact, no erythema or drainage  CV: RRR, Normal S1S2, no M/R/G  Abdomen:  Distended, TTP, firm across upper abdomen, hypoactive bowel sounds  Extremities: MAE, WWP  Skin:Warm and dry to touch; no rashes  Neuro: alert and oriented, speech very slow, soft as baseline   Lines/Drains/Tubes: 4/9 PIV R wrist, 4/9 RFA      Recent Labs  Lab 06/14/17  7867 06/13/17  1149 06/13/17  0921 06/12/17  2331  06/12/17  0345   WBC 10.8*  --   --  11.6*  --  10.4*   Hemoglobin 7.4*  --  7.7* 7.0*  --  7.1*   Hematocrit 24*  --   --  23*  --  22*   Platelets 585* 582*  --  561*  < > 540*   Seg Neut % 57.6  --   --  66.6  --  60.7   Lymphocyte % 27.2  --   --  19.3  --  24.1   Monocyte % 8.7  --   --  4.4  --  8.0   Eosinophil % 4.2  --   --  7.9  --  5.4   < > = values in this interval not displayed.      Recent Labs  Lab 06/14/17  0033 06/12/17  2331 06/12/17  0345   Sodium 132* 132* 133   Potassium 5.0 5.1 4.4   CO2 _0 UN _1 Creatinine 0.53 0.53 0.54   Glucose 103* 86 111*   Calcium 8.3* 8.4* 8.3*         Lab results: 06/10/17  0106 06/07/17  0031 06/06/17  0114 06/05/17  2305 06/03/17  0518   Total Protein 6.3 6.7 6.8 7.7 7.3   Albumin 2.2* 2.3* 2.4* 2.6* 2.7*   ALT 57* 49* 47* 49* 45*   AST 91* 76* 69* 68* 62*   Alk Phos 407* 453* 489* 535* 526*   Bilirubin,Total <0.2 <0.2 <0.2 <0.2 <0.2       Lab Results  Component Value Date/Time   CRP 157 (H)  06/14/2017 0525   CRP 170 (H) 06/12/2017 2331   CRP 184 (H) 06/09/2017 0401       Lab Results  Component Value Date/Time   Sedimentation Rate 65 (H) 06/13/2017 1807   Sedimentation Rate 69 (H) 06/09/2017 0401   Sedimentation Rate 35 (H) 06/07/2017 0031         Lab results: 06/12/17  0345   CK 47       Micro:   1/25: Intraoperative bile: GS 0 pmns, no organisms, Cx: no growth  2/12: 1 set of blood cultures from the right Mediport (no peripheral bld was sent): positive for Enterobacter cloacae complex in 70 hrs, sensitive to zosyn.   2/18 abdominal GS had >25 PMNs, many GPC in pairs/chains, many GNB and cultures 4+ Enterobacter cloacae complex. The cultures also grew 1+ Candida glabrata   2/21: BCx from the Right peripheral IV, Mediport, L IJ:  No growth  05/03/17: IR-guided I&D with drain placement of the left upper quadrant collection: GS > 25 PMNs, many GNB and GN diplococci, Cultures with 4+ Enterobacter cloacae complex (resistant to Zosyn and CTX), C. Albicans and C. Glabrata, and Candida tropicalis  05/04/17:    1 set of blood cultures from left IJ: VRE at 15.3 TTP. Daptomycin sensitivities: MIC 22mg/ml (dose dependent),  Linezolid   1 set from periphery: VRE and Enterobacter cloacae at 14.8 TTP   05/05/17:  IR-guided I&D with drain placement of the intrahepatic collection: GS 1-10 PMNs, very few GPC in pairs. Fungal cultures with Candida glabrata  05/05/17: Blood cultures from periphery, Mediport and IJ: no growth  05/04/17: Urine cultures obtained (for unclear reason): no growth  05/08/17: Abscess GS: >25 PMNs, many GPC in chains, few GNB. Aerobic cultures growing 2+ Enterobacter (  ertapenem, Cipro, TMP/SMX-sensitive) and 3+VRE, Fungal: C. Glabrata (sens to caspo, vori and dose dependent sens to fluconazole)    06/06/17: 2 sets of BCx (1 set each from periphery and Mediport) - taken after restarting ertapenem: IVAD grew VRE ttp 18.2 hours,  Periphery (Left arm) grew VRE ttp 17.3.    06/07/17 Pleural fluid (left) GS 1-10  PMN's, 1-10 Nucleated white cells, No organisms seen and 1604 nucleated cells. No growth on culture.     06/07/17 Abdominal abscess: labs cancelled - no specimens received.     06/08/17: Mediport BC + for VRE in 23 hrs (R-Amp, R- tetracycline per lab, suppressed result, S-Linezolid, S-Dapto dose dependent),  Right hand peripheral cx: No growth     06/10/17: Blood culture 2/2: 1 periphery (RH), 1 from Mediport before d/c's: NGTD    06/11/17: Blood Culture 2/2 (both periphery) NGTD    Imaging/Other Relevant Diagnostics:   06/14/17 Abd/KUB pending    06/13/17 Abd/KUB:  Left pleural effusion is noted along with left basilar airspace disease/atelectasis.  Indwelling Dobbhoff tube is noted in place tip overlying the gastric body. Some residual contrast is noted in the stomach. Residual contrast is also noted in small and large bowel loops. No gross bowel obstruction is seen. Free air cannot be excluded on this exam. Cholecystectomy clips are seen in the right upper quadrant of the abdomen.      Assessment and Plan:  ID problem(s):   -Enterobacter cloacae bacteremia  -Multiple and extensive Intra-abdominal abscesses growing VRE, enterobacter and candida glabrata    72yo old female with pancreatic cancer, s/p Whipple procedure on 03/29/17 after neoadjuvant chemotherapy and radiation. Her post op course was c/b delayed gastric emptying, gastric distension and obstruction and a large surgical bed hematoma causing CBD and hepatic vein compression, severe biliary sepsis, s/p placement of a right hepatic duct external biliary drain placed on 2/12, found to have enterobacter cloacae bacteremia (2/12) presumed to be secondary to infection of the hematoma. However CT of abdomen (2/18) showed the hematoma had resolved but noted to have a well-circumscribed and larger anterior abdominal collection percutaneously, which was aspirated (2/19) and cultures grew enterobacter cloacae and Candida glabrata. ID service consulted on 2/21 with question  of treating C.glabrata.  Caspofungin was started and a perc drain was placed LUQ 3/1, then anterior abdominal perc drain placed 3/3, and then anterior perc drain into left hepatic collection 05/08/17, the goal being source control with drain placements. She had been on Zosyn but her enterobacter became (amp-C inducible) thus switched to ertapenem and Daptomycin added on 05/04/17 for continued broad coverage.  Caspofungin was switched to oral high-dose fluconazole (36m/kg) daily once sensitivities returned on C.glabrata. Pt on methadone for pain control, QTc 409. Abdominal perc drains where removed once drainage was minimal, biliary tube was removed last week. However, there remained several communicating multiloculated fluid collections in LUQ and some areas of reaccumulation on 05/23/17 CT of a/p.    Summary of abdominal drain removals:   Mid Abd 3/17  LUQ 3/19  Biliary Tube 3/26  Right lateral Abd 3/27    Patient was switched over to oral therapy on 05/30/17 in hopes to ready her for SNF Rehab, ie, Bactrim for enterobacter and to remain on high dose fluconazole for C. Glabrata, with plan to reimage to fluid collections.   Linezolid was considered for VRE but ultimately was not a good option for her given risk vs benefits 2/2 to multiple drug-to-drug interactions (consulted with ID pharmacist who agreed).  Davie initially was stable on Bactrim and fluconazole and ID (Team 3) signed off on 06/03/17.     ID Fellow team was re-consulted on 4/4 fpr pt rigors/lethargy/tachycardia (concern for early sepsis), however as afebrile and no leukocytosis at that time. A CT A/P on 06/06/17, compared to CT from 05/23/17, showed interval development of a new loculated 3 cm fluid collection in the right hepatic lobe, while the hepatic fluid collections remain stable in size. CT chest on 06/06/17 moderate left pleural effusion (no change from before). She as restarted ertapenem 1gm daily and daptomycin at 26m/kg as she was on prior to oral  regimen, and continued on fluconazole 8042mdaily.  She had a left thoracentesis on 4/5, pigtail placed, pleural fluid analysis showed exudative and 1604 nucleated cells. No growth on cultures, pigtail removed on 06/09/17.  Blood cultures from 4/4, both periphery and Mediport grew VRE, and 4/6 and 4/8 only Mediport positive for VRE. Initially VRE bacteremia due to the pelvic abscesses, but now VRE seeded mediport which was removed on 06/10/17.   A PICC was placed in LUE on 4/11 (48 hours of negative blood cultures and afebrile).     Currently SyMckenas HDS, afebrile, now with mild leukocytosis though stable between 10.4-11.6K.  She feels very weak, unable to take much PO food d/t anorexia and early satiety.  Her WOB is unlabored when sitting in chair, reports SOB with exertion at times.  She continues with tender and distended abdomen.  Moving her bowels and denying diarrhea.  There is a plan for PEG tube today in IR.  She voiced fear of her condition not improving.  Time spent with patient discussing her  teams continue to work on plan towards discharge and building her strength, discussed antibiotic plan, ie, IV Daptomycin at least through till repeat abdominal imaging done on 4/18, oral Bactrim and fluconazole.     Recommendations/plan:  -  Continue daptomycin at 63m30mg (VRE), TMP/SMX DS 1 tablet every 12 hours (Enterobacter), and fluconazole 800m47mily (C.glabrata) at least through 06/20/17,duration TBD by clinical course and imaging.   - Follow blood cultures through finalization  - Repeat abdominal/pelvis contrasted CT in 2 weeks from the last a/p CT (ie on 06/20/17) to reassess the size of the liver and abdominal-pelvic abscesses which will help determine further duration of the Abx.  - Weekly liver chemistries while on the fluconazole.  - Weekly CK while on Daptomycin  - CBC with diff and BMP at least twice weekly (recommend daily CBC for now until trends to normal)   - ID team 3 will follow along      Thank you  for allowing us [place] in the care of this patient  Please call with questions     ValeLonzo CandyP-BC  Infectious Diseases, Team 3  Pager [phone]   Addendum 4/16: I spoke with covering provider on 4/12, confirmed ok to switch Ertapenem to Bactrim per 4/10 Fellow's recommendation.

## 2017-06-14 NOTE — Preop H&P (Signed)
OUTPATIENT  Chief Complaint: G tube placement     History of Present Illness:  [name] w/ PMH fibromyalgia, pancreatic CA s/p Whipple for 'cure' 03/29/17; s/p ICU w/ intub for resp failure & septic shock, weaned from pressors and extubated to HFNC 2/17 w/ IR drainage bili fluid collection 2/19, s/p IV abx, now all abd drains for abscesses removed. Palliative following for symptoms, started methadone 2/19, weaned dose 2/23 d/t concern for sedation. Pt w/ ongoing poor po intake, now tolerating bolus fds via dubhoff fdg tube.      HPI    Past Medical History:   Diagnosis Date    Acute kidney failure ([place]) 03/31/2017    Cancer     Depression     Fibromyalgia     Hypothyroidism      Past Surgical History:   Procedure Laterality Date    CHOLECYSTECTOMY      CHOLECYSTECTOMY, LAPAROSCOPIC  09/06/2016    HYSTERECTOMY  08/1986    Fibroids    KNEE SURGERY Right     PR INSERT TUNNELED CV CATH WITH PORT Right 10/04/2016    Procedure: Right IJ MEDIPORT Insertion;  Surgeon: Delano Metz, MD;  Location: [facility] MAIN OR;  Service: Oncology General    PR LAP,DIAGNOSTIC ABDOMEN N/A 10/04/2016    Procedure: LAPAROSCOPY DIAGNOSTIC;  Surgeon: Delano Metz, MD;  Location: [facility] MAIN OR;  Service: Oncology General    PR PART [place] PANC,PROX+REMV DUOD+ANAST N/A 03/29/2017    Procedure: WHIPPLE PROCEDURE;  Surgeon: Delano Metz, MD;  Location: [facility] MAIN OR;  Service: Oncology General    ROTATOR CUFF REPAIR Right     TONSILLECTOMY AND ADENOIDECTOMY       Family History   Problem Relation Age of Onset    Breast cancer Mother         died 49    Cancer Father         NHL, died 67    Heart Disease Father     Diabetes Sister     Obesity Sister         4 siblings     Social History     Social History    Marital status: Divorced     Spouse name: N/A    Number of children: N/A    Years of education: N/A     Social History Main Topics    Smoking status: Never Smoker    Smokeless tobacco: Never Used    Alcohol use No    Drug use: No    Sexual  activity: Not Asked     Other Topics Concern    None     Social History Narrative    None       Allergies: No Known Allergies (drug, envir, food or latex)    Current Facility-Administered Medications   Medication Dose Route Frequency    sulfamethoxazole-trimethoprim (BACTRIM DS,SEPTRA DS) 800-160 MG per tablet 1 tablet  1 tablet Oral 2 times per day    [START ON 06/15/2017] ferrous sulfate tablet 325 mg  325 mg Oral Daily with breakfast    lactobacillus rhamnosus (GG) (CULTURELLE) capsule 1 each  1 capsule Oral Daily    methadone (DOLOPHINE) tablet 5 mg  5 mg Oral 3 times per day    mineral oil-hydrophilic petrolatum (AQUAPHOR) ointment   Topical PRN    sodium chloride 0.9 % flush 10 mL  10 mL Intracatheter Q8H PRN    sodium chloride 0.9 % flush 10 mL  10 mL  Intracatheter PRN    DAPTOmycin (CUBICIN) 600 mg in sodium chloride 0.9% 70 mL IVPB  8 mg/kg Intravenous Q24H    methylphenidate (RITALIN) tablet 2.5 mg  2.5 mg Oral BID    sodium chloride 0.9 % FLUSH REQUIRED IF PATIENT HAS IV  0-500 mL/hr Intravenous PRN    dextrose 5 % FLUSH REQUIRED IF PATIENT HAS IV  0-500 mL/hr Intravenous PRN    sodium chloride 0.9% IV  3 mL/hr Intravenous Continuous    polyethylene glycol (GLYCOLAX,MIRALAX) powder 17 g  17 g Per G Tube Daily    sodium chloride 0.9% IV  100 mL/hr Intravenous Continuous    ibuprofen (ADVIL,MOTRIN) tablet 600 mg  600 mg Oral Q6H PRN    insulin glargine (LANTUS) injection 4 Units  4 Units Subcutaneous Nightly    lidocaine (LIDODERM) 5 % patch 1 patch  1 patch Transdermal Q24H    triamterene-hydrochlorothiazide (MAXZIDE-25) 37.5-25 MG per tablet 1 tablet  1 tablet Oral QAM    lidocaine (LIDODERM) 5 % patch 1 patch  1 patch Transdermal Q24H    HYDROmorphone (DILAUDID) tablet 2 mg  2 mg Oral Q4H PRN    naloxone (NARCAN) 0.4 mg/mL injection 0.1 mg  0.1 mg Intravenous Q5 Min PRN    sodium chloride 0.9 % FLUSH REQUIRED IF PATIENT HAS IV  0-500 mL/hr Intravenous PRN    dextrose 5 % FLUSH  REQUIRED IF PATIENT HAS IV  0-500 mL/hr Intravenous PRN    fluconazole (DIFLUCAN) tablet 800 mg  800 mg Oral Daily    pantoprazole (PROTONIX) EC tablet 40 mg  40 mg Oral BID AC    calcium carbonate (TUMS) chewable tablet 1,000 mg  1,000 mg Oral BID PRN    senna (SENOKOT) tablet 2 tablet  2 tablet Oral 2 times per day    bisacodyl (DULCOLAX) suppository 10 mg  10 mg Rectal Daily    DULoxetine (CYMBALTA) DR capsule 20 mg  20 mg Oral Daily    metoclopramide (REGLAN) tablet 10 mg  10 mg Oral 4x Daily AC & HS    docusate sodium (COLACE) capsule 200 mg  200 mg Oral 2 times per day    acetaminophen (TYLENOL) tablet 1,000 mg  1,000 mg Oral Q8H    heparin lock flush injection 50 Units  50 Units Intracatheter PRN    ondansetron (ZOFRAN) injection 4 mg  4 mg Intravenous Q6H PRN    levothyroxine (SYNTHROID, LEVOTHROID) tablet 137 mcg  137 mcg Oral Daily     Facility-Administered Medications Ordered in Other Encounters   Medication Dose Route Frequency    etomidate (AMIDATE) 2 mg/mL injection    PRN    rocuronium (ZEMURON) 10 mg/mL injection   Intravenous PRN        Review of Systems: '  NAD  NO abdominal pain  No shortness of brath       ROS    Last Nursing documented pain:  0-10 Scale: 0 (06/14/17 1211)      Patient Vitals for the past 24 hrs:   BP Temp Temp src Pulse Resp SpO2   06/14/17 1500 102/62 - - 94 21 92 %   06/14/17 1211 110/60 36 C (96.8 F) TEMPORAL 94 16 98 %   06/14/17 0936 108/60 36.1 C (97 F) TEMPORAL 98 16 96 %   06/14/17 0330 124/80 36.1 C (97 F) TEMPORAL 91 16 97 %   06/14/17 0100 - - - - 14 -   06/13/17 2344 120/80 36.4 C (97.5  F) TEMPORAL 96 16 97 %   06/13/17 2139 - - - - 16 -   06/13/17 2022 108/74 36.4 C (97.5 F) TEMPORAL 94 16 97 %   06/13/17 1620 102/68 36.2 C (97.2 F) TEMPORAL 94 16 98 %          NAD, AAOX3  CTABL  RRR  SF,NT,ND    Physical Exam    Lab Results:   All labs in the last 72 hours:  Recent Results (from the past 72 hour(s))   APTT    Collection Time: 06/11/17   4:26 PM   Result Value Ref Range    aPTT CANCELED 25.8 - 37.9 sec   APTT    Collection Time: 06/11/17  6:43 PM   Result Value Ref Range    aPTT 36.4 25.8 - 37.9 sec   POCT glucose    Collection Time: 06/11/17 10:10 PM   Result Value Ref Range    Glucose POCT 106 (H) 60 - 99 mg/dL   CK    Collection Time: 06/12/17  3:45 AM   Result Value Ref Range    CK 47 26 - 192 U/L   CBC and differential    Collection Time: 06/12/17  3:45 AM   Result Value Ref Range    WBC 10.4 (H) 4.0 - 10.0 THOU/uL    RBC 2.5 (L) 3.9 - 5.2 MIL/uL    Hemoglobin 7.1 (L) 11.2 - 15.7 g/dL    Hematocrit 22 (L) 34 - 45 %    MCV 89 79 - 95 fL    MCH 28 26 - 32 pg/cell    MCHC 32 32 - 36 g/dL    RDW 16.8 (H) 11.7 - 14.4 %    Platelets 540 (H) 160 - 370 THOU/uL    Seg Neut % 60.7 %    Lymphocyte % 24.1 %    Monocyte % 8.0 %    Eosinophil % 5.4 %    Basophil % 0.0 %    Neut # K/uL 6.3 (H) 1.6 - 6.1 THOU/uL    Lymph # K/uL 2.7 1.2 - 3.7 THOU/uL    Mono # K/uL 0.8 0.2 - 0.9 THOU/uL    Eos # K/uL 0.6 (H) 0.0 - 0.4 THOU/uL    Baso # K/uL 0.0 0.0 - 0.1 THOU/uL    Nucl RBC % 0.0 0.0 - 0.2 /100 WBC    Nucl RBC # K/uL 0.0 0.0 - 0.0 THOU/uL   Basic metabolic panel    Collection Time: 06/12/17  3:45 AM   Result Value Ref Range    Glucose 111 (H) 60 - 99 mg/dL    Sodium 133 133 - 145 mmol/L    Potassium 4.4 3.3 - 5.1 mmol/L    Chloride 96 96 - 108 mmol/L    CO2 24 20 - 28 mmol/L    Anion Gap 13 7 - 16    UN 12 6 - 20 mg/dL    Creatinine 0.54 0.51 - 0.95 mg/dL    GFR,Caucasian 95 *    GFR,Black 110 *    Calcium 8.3 (L) 8.6 - 10.2 mg/dL   Protime-INR    Collection Time: 06/12/17  3:45 AM   Result Value Ref Range    Protime CANCELED 10.0 - 12.9 sec    INR CANCELED 0.9 - 1.1   APTT    Collection Time: 06/12/17  3:45 AM   Result Value Ref Range    aPTT CANCELED 25.8 - 37.9 sec  Diff manual    Collection Time: 06/12/17  3:45 AM   Result Value Ref Range    React Lymph % 2 0 - 6 %    Giant PLTs Present     Manual DIFF RESULTS     Diff Based On 112 CELLS   APTT     Collection Time: 06/12/17  5:22 AM   Result Value Ref Range    aPTT 31.2 25.8 - 37.9 sec   Platelet count    Collection Time: 06/12/17  9:23 AM   Result Value Ref Range    Platelets 537 (H) 160 - 370 THOU/uL   Hold SST    Collection Time: 06/12/17  9:23 AM   Result Value Ref Range    Hold SST HOLD TUBE    APTT    Collection Time: 06/12/17  3:56 PM   Result Value Ref Range    aPTT 31.4 25.8 - 37.9 sec   POCT glucose    Collection Time: 06/12/17  9:34 PM   Result Value Ref Range    Glucose POCT 93 60 - 99 mg/dL   C reactive protein    Collection Time: 06/12/17 11:31 PM   Result Value Ref Range    CRP 170 (H) 0 - 10 mg/L   CBC and differential    Collection Time: 06/12/17 11:31 PM   Result Value Ref Range    Platelets 561 (H) 160 - 370 THOU/uL    WBC 11.6 (H) 4.0 - 10.0 THOU/uL    RBC 2.6 (L) 3.9 - 5.2 MIL/uL    Hemoglobin 7.0 (L) 11.2 - 15.7 g/dL    Hematocrit 23 (L) 34 - 45 %    MCV 88 79 - 95 fL    MCH 27 26 - 32 pg/cell    MCHC 31 (L) 32 - 36 g/dL    RDW 16.8 (H) 11.7 - 14.4 %    Seg Neut % 66.6 %    Lymphocyte % 19.3 %    Monocyte % 4.4 %    Eosinophil % 7.9 %    Basophil % 0.9 %    Neut # K/uL 7.8 (H) 1.6 - 6.1 THOU/uL    Lymph # K/uL 2.2 1.2 - 3.7 THOU/uL    Mono # K/uL 0.5 0.2 - 0.9 THOU/uL    Eos # K/uL 0.9 (H) 0.0 - 0.4 THOU/uL    Baso # K/uL 0.1 0.0 - 0.1 THOU/uL    Nucl RBC % 0.0 0.0 - 0.2 /100 WBC    Nucl RBC # K/uL 0.0 0.0 - 0.0 THOU/uL   Basic metabolic panel    Collection Time: 06/12/17 11:31 PM   Result Value Ref Range    Glucose 86 60 - 99 mg/dL    Sodium 132 (L) 133 - 145 mmol/L    Potassium 5.1 3.3 - 5.1 mmol/L    Chloride 97 96 - 108 mmol/L    CO2 23 20 - 28 mmol/L    Anion Gap 12 7 - 16    UN 11 6 - 20 mg/dL    Creatinine 0.53 0.51 - 0.95 mg/dL    GFR,Caucasian 96 *    GFR,Black 111 *    Calcium 8.4 (L) 8.6 - 10.2 mg/dL   APTT    Collection Time: 06/12/17 11:31 PM   Result Value Ref Range    aPTT 36.6 25.8 - 37.9 sec   Diff manual    Collection Time: 06/12/17 11:31 PM   Result Value Ref  Range     Myelocyte % 1 (H) 0 - 0 %    Giant PLTs Present     Manual DIFF RESULTS     Diff Based On 114 CELLS   Magnesium    Collection Time: 06/12/17 11:31 PM   Result Value Ref Range    Magnesium 1.6 1.6 - 2.5 mg/dL   Phosphorus    Collection Time: 06/12/17 11:31 PM   Result Value Ref Range    Phosphorus 5.1 (H) 2.7 - 4.5 mg/dL   Venous blood gas from the PICC line    Collection Time: 06/13/17  9:21 AM   Result Value Ref Range    Hemoglobin 7.7 (L) 11.2 - 15.7 g/dL    PH,VENOUS 7.42 7.32 - 7.42    PCO2,VENOUS 40 40 - 50 mm Hg    PO2,VENOUS 36 25 - 43 mm Hg    Bicarbonate,VENOUS 25 21 - 28 mmol/L    Base Excess,VENOUS 0 -3 - 2 mmol/L    CO2 (Calc),VENOUS 26 22 - 31 mmol/L    FO2 HB,VENOUS 58 (L) 63 - 83 %    CO 1.4 %    Methemoglobin 0.3 0.0 - 1.0 %   Platelet count    Collection Time: 06/13/17 11:49 AM   Result Value Ref Range    Platelets 582 (H) 160 - 370 THOU/uL   Sedimentation rate, automated    Collection Time: 06/13/17  6:07 PM   Result Value Ref Range    Sedimentation Rate 65 (H) 0 - 30 mm/hr     *Note: Due to a large number of results and/or encounters for the requested time period, some results have not been displayed. A complete set of results can be found in Results Review.       Radiology impressions (last 3 days):  No results found.    Currently Active/Followed Hospital Problems:  Active Hospital Problems    Diagnosis    *!*Pancreatic adenocarcinoma s/p Whipple 03/29/17     -Whipple on 1/25 with Dr. 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 by hematoma, PTC drain 2/12 in IR  -Continue TPN with SSI for glucose control  - CT 2/18 with persistent fluid collection   - IR for drainage, 60 mL 2/19   - Gram stain with GNB and GPC in pairs and chains - culture prelim enterobacter cloacae and gandida glabrata  - Caspo/Zosyn x10 days from 2/19 per ID      Sepsis     - Occurring in setting of biliary obstruction s/p PTC external/internal  placement for source control  - BCx 2/12 Enterobacter 72hrs to growth, Zosyn (2/12-2/18) Vanco (2/12-2/14),  - ID following, BC 2/21 NGTD, Caspo zosyn x 10 days from 2/19       hx of Pulmonary embolism     - PE 01/18/17 on CT chest   - This hospitalization has been on therapeutic Lovenox, and Heparin gtt  - Currently holding therapeutic anticoagulation in setting of hematoma and ongoing risk for bleeding  - Heparin SQ TID  - Per HBP - will not restart therapeutic anticoagulation in the near future      Cancer associated pain     Methadone at home  Pain management guided by patient's primary pain physician Dr Irven [place]  - QTc acceptable, monitor for prolongation  - consider PCA when appropriate  - Methadone 76m PO q8h  - Palliative following, dilaudid PRN, ativan  Depression     -Continue Elavil and Lexapro      Hypothyroidism     -Synthroid          Assessment: [name] w/ PMH fibromyalgia, pancreatic CA s/p Whipple for 'cure' 03/29/17; s/p ICU w/ intub for resp failure & septic shock, weaned from pressors and extubated to HFNC 2/17 w/ IR drainage bili fluid collection 2/19, s/p IV abx, now all abd drains for abscesses removed. Palliative following for symptoms, started methadone 2/19, weaned dose 2/23 d/t concern for sedation. Pt w/ ongoing poor po intake, now tolerating bolus fds via dubhoff fdg tube. Ariving to IR for G tube placement.     Plan: Place G tube.     Author: Argentina Ponder, MD  Note created: 06/14/2017  at: 3:04 PM

## 2017-06-14 NOTE — Plan of Care (Signed)
Problem: Safety  Goal: Patient will remain free of falls  Outcome: Maintaining      Problem: Pain/Comfort  Goal: Patient's pain or discomfort is manageable  Outcome: Progressing towards goal      Problem: Mobility  Goal: Functional status is maintained or improved - Geriatric  Outcome: Progressing towards goal    Goal: Patient's functional status is maintained or improved  Outcome: Progressing towards goal      Problem: Nutrition  Goal: Patient's nutritional status is maintained or improved  Outcome: Progressing towards goal    Goal: Nutritional status is maintained or improved - Geriatric  Outcome: Progressing towards goal      Problem: Bowel Elimination  Goal: Elimination patterns are normal or improving  Outcome: Progressing towards goal      Problem: Post-Operative Hemodynamic Stability  Goal: Maintain Hemodynamic Stability  Outcome: Progressing towards goal      Problem: Post-Operative Bowel Elimination  Goal: Elimination pattern is normal or improving  Outcome: Progressing towards goal      Problem: Psychosocial  Goal: Demonstrates ability to cope with illness  Outcome: Progressing towards goal      Problem: GI Bleeding Elimination  Goal: Elimination of patterns are normal or improving  Outcome: Progressing towards goal

## 2017-06-14 NOTE — Progress Notes (Signed)
Assumed care of pt at 0700. Pt left for IR at approximately 1400. Pt will continue to be monitored upon return to unit. Emi Belfast, RN

## 2017-06-14 NOTE — Progress Notes (Addendum)
[employer] Nursing Procedure Note    Amanda Galloway  [id number]    Procedure: peg tube placement        Status: Completed    Patient tolerated procedure well, experienced the following problems intraprocedurally emesis requiring suctioning.     Specimen Collection: no    Sponge count: N/A    Fluid Removed:N/A  Procedure Dressing Site located:Abdomen  Dressing Type:drainage sponge  Biopatch:no  Dressing status:Clean, dry and intact   Hematoma:Not evident  Medication received:Versed 3mg , Fentanyl 125mcg and 1g ancef, 8mg  zofran, 1mg  glucagon  Cardiovascular:   Peripheral Pulses: N/A      Fistula: N/A    Neuro Assessment:N/A    Implant patient information given to patient or parent/guardian:N/A    Report given to:Unit Nurse. Unit [facility] Name Amanda Galloway Vitals    06/14/17 1605   BP: 127/75   Pulse: (!) 112   Resp: 24   Temp:    SpO2: 93%

## 2017-06-14 NOTE — Consults (Addendum)
Wound/Skin Care Rounds:  [id number]    Patient seen during unit skin rounds. B elbows nearly closed skin tears, Allevyn Gentle 3x3 dressings changed. Mid back with small partial thickness pinpoint erosions from likely friction, new Allevyn Gentle 3x3 applied. Change Allevyn Gentle dressings weekly and PRN.    BLE's continue with edema, erythema/ruddiness to gaiter area- multiple scabs and resolving vesicles present.     Recommendations:  BLE's - Cleanse with warm water/soap, pat dry and apply Aquaphor (Rx) daily please.    Please consult with any questions or concerns.  Roselee Nova, RN, [employer] [phone]

## 2017-06-14 NOTE — Progress Notes (Addendum)
Surgical Oncology/Hepatobiliary Surgery Progress Note     LOS: 77 days     Subjective:  Interval Events:   Planning for perc G-tube placement w/ IR today.   No acute events overnight    Objective:  Vitals Sign Ranges for Past 24 Hours:  BP: (98-124)/(56-80)   Temp:  [36 C (96.8 F)-36.4 C (97.5 F)]   Temp src: Temporal (04/12 0330)  Heart Rate:  [86-96]   Resp:  [14-16]   SpO2:  [97 %-98 %]     Physical Exam:  General Appearance: in NAD  Cardiac: regular rate  Respiratory: non-labored breathing on room air  Abdomen: Soft, non tender to palpation, non distended, incision well healed   Extremities: warm, erythema of the bilateral anterior calves with small amount of skin breakdown, minimal edema no posterior calf tenderness     Labs:    CBC:    Recent Labs  Lab 06/14/17  0033 06/13/17  1149  06/12/17  2331 06/12/17  0923 06/12/17  0345 06/11/17  0909 06/11/17  0201 06/10/17  0106 06/09/17  0401   WBC 10.8*  --   --  11.6*  --  10.4*  --  9.8 8.4 7.8   Hemoglobin 7.4*  --   < > 7.0*  --  7.1*  --  8.6* 7.7* 7.5*   Hematocrit 24*  --   --  23*  --  22*  --  28* 24* 25*   Platelets 585* 582*  --  561* 537* 540* 507* 494* 488* 448*   < > = values in this interval not displayed.    Metabolic Panel:    Recent Labs  Lab 06/14/17  0033 06/12/17  2331 06/12/17  0345 06/11/17  0201 06/10/17  0106 06/09/17  0401   Sodium 132* 132* 133 132* 132*   131* 132*   Potassium 5.0 5.1 4.4 4.6 4.0   4.0 4.0   Chloride 95* 97 96 95* 95*   96 96   CO2 '23 23 24 21 20   21 23   ' UN '10 11 12 12 14   15 16   ' Creatinine 0.53 0.53 0.54 0.56 0.57   0.57 0.59   Glucose 103* 86 111* 119* 107*   105* 111*   Calcium 8.3* 8.4* 8.3* 8.2* 8.0*   8.2* 8.5*   Magnesium  --  1.6  --   --  1.5*  --    Phosphorus  --  5.1*  --   --  4.3  --           Assessment:  72 y.o. female with h/o recent PE and pancreatic cancer POD # 62  status post whipple procedure complicated by delayed gastric emptying.  NGT replaced on 04/04/17. UGI on 04/12/17 concerning for  obstruction just beyond the [place] in the efferent limb.  Course complicated on 3/71/06 by rising LFTs and sepsis with CT concerning for cholangitis given biliary tree obstruction and PV compression due to hematoma and afferent limb distention in setting of PE treatment. Is s/p IR PTC on 04/16/17 and IR percutaneous aspiration of anterior abdominal fluid collection on 04/24/17. IR LUQ perc drain placement 3/1. IR anterior abdominal 110f perc drain placement 3/3. IR anterior perc drain into left hepatic collection 3/6.    Plan:  - Regular Diet with TF's. IR planning for G-tube placement today. Resume diet after procedure  - Plan to continue IV antibiotic regimen (Ertapenem and Daptomycin) per ID Recommendations. Duration until resolution  of Hepatic Abscess.  - Hyponatremia: Stable from previous.  - Anemia; stable.  - Aggressive bowel regimen  - SCDs, restart lovenox after G tube today.  - Dispo: Pending clinical course, resolution of acute infection. Pt has Physical Debilitation requiring a rehab setting. PT/OT recommending SNF.    Cherylann Banas, MD  06/14/2017     8:37 AM   General Surgery Resident     HPB-GI Attending Addendum:   I personally examined the patient, reviewed the notes, and discussed the plan of care with the residents and patient. Agree with detailed resident's note. Please see the note above for details of history, exam, labs, assessment/plan which reflect my input.      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      Bacteremia with VRE. Mediport removed. IV antibiotics.    new cultures negative. PICC line in place.   Hyperglycemia with insulin sliding scale and recurrent need for insulin. continue   Post operative anemia. Stable. No need for transfusion  Pulmonary embolism. Present on admission. Heparin drip.  Hypoalbuminemia. Moderate malnutrition. Bolus tube feedings at goal. Encouraging oral  intake  Depression. Encouragement provided. Slightly improved. Ritalin helpful.   Constipation . Aggressive regimen.   Physical debility. Requires rehabilitation setting.     PEG tube today.      Delano Metz, MD, FACS  Hepatobiliary, Pancreas & GI Surgery

## 2017-06-15 ENCOUNTER — Other Ambulatory Visit: Payer: Self-pay | Admitting: Cardiology

## 2017-06-15 LAB — CBC AND DIFFERENTIAL
Baso # K/uL: 0.1 10*3/uL (ref 0.0–0.1)
Basophil %: 0.6 %
Eos # K/uL: 0.1 10*3/uL (ref 0.0–0.4)
Eosinophil %: 0.5 %
Hematocrit: 26 % — ABNORMAL LOW (ref 34–45)
Hemoglobin: 8 g/dL — ABNORMAL LOW (ref 11.2–15.7)
IMM Granulocytes #: 0.1 10*3/uL
IMM Granulocytes: 0.9 %
Lymph # K/uL: 1.5 10*3/uL (ref 1.2–3.7)
Lymphocyte %: 9.9 %
MCH: 27 pg/cell (ref 26–32)
MCHC: 31 g/dL — ABNORMAL LOW (ref 32–36)
MCV: 87 fL (ref 79–95)
Mono # K/uL: 0.9 10*3/uL (ref 0.2–0.9)
Monocyte %: 6 %
Neut # K/uL: 12 10*3/uL — ABNORMAL HIGH (ref 1.6–6.1)
Nucl RBC # K/uL: 0 10*3/uL (ref 0.0–0.0)
Nucl RBC %: 0 /100 WBC (ref 0.0–0.2)
Platelets: 645 10*3/uL — ABNORMAL HIGH (ref 160–370)
RBC: 2.9 MIL/uL — ABNORMAL LOW (ref 3.9–5.2)
RDW: 16.7 % — ABNORMAL HIGH (ref 11.7–14.4)
Seg Neut %: 82.1 %
WBC: 14.6 10*3/uL — ABNORMAL HIGH (ref 4.0–10.0)

## 2017-06-15 LAB — BASIC METABOLIC PANEL
Anion Gap: 16 (ref 7–16)
CO2: 21 mmol/L (ref 20–28)
Calcium: 8.2 mg/dL — ABNORMAL LOW (ref 8.6–10.2)
Chloride: 95 mmol/L — ABNORMAL LOW (ref 96–108)
Creatinine: 0.54 mg/dL (ref 0.51–0.95)
GFR,Black: 110 *
GFR,Caucasian: 95 *
Glucose: 101 mg/dL — ABNORMAL HIGH (ref 60–99)
Lab: 8 mg/dL (ref 6–20)
Potassium: 5 mmol/L (ref 3.3–5.1)
Sodium: 132 mmol/L — ABNORMAL LOW (ref 133–145)

## 2017-06-15 LAB — BLOOD CULTURE

## 2017-06-15 LAB — POCT GLUCOSE: Glucose POCT: 147 mg/dL — ABNORMAL HIGH (ref 60–99)

## 2017-06-15 LAB — APTT
aPTT: 32.4 s (ref 25.8–37.9)
aPTT: 34.3 s (ref 25.8–37.9)
aPTT: 36.8 s (ref 25.8–37.9)

## 2017-06-15 LAB — CRP: CRP: 170 mg/L — ABNORMAL HIGH (ref 0–10)

## 2017-06-15 NOTE — Progress Notes (Addendum)
Surgical Oncology/Hepatobiliary Surgery Progress Note     LOS: 78 days     Subjective:  Interval Events:   G tube yesterday  Tube ok for use    Objective:  Vitals Sign Ranges for Past 24 Hours:  BP: (102-132)/(55-85)   Temp:  [35.9 C (96.6 F)-36.9 C (98.4 F)]   Temp src: Temporal (04/13 0847)  Heart Rate:  [91-115]   Resp:  [19-30]   SpO2:  [92 %-100 %]     Physical Exam:  General Appearance: in NAD  Cardiac: regular rate  Respiratory: non-labored breathing on room air  Abdomen: Soft, non tender to palpation, non distended, incision well healed   Extremities: warm, erythema of the bilateral anterior calves with small amount of skin breakdown, minimal edema no posterior calf tenderness     Labs:    CBC:    Recent Labs  Lab 06/15/17  0206 06/14/17  1805 06/14/17  0033 06/13/17  1149  06/12/17  2331 06/12/17  0923 06/12/17  0345  06/11/17  0201 06/10/17  0106   WBC 14.6*  --  10.8*  --   --  11.6*  --  10.4*  --  9.8 8.4   Hemoglobin 8.0*  --  7.4*  --   < > 7.0*  --  7.1*  --  8.6* 7.7*   Hematocrit 26*  --  24*  --   --  23*  --  22*  --  28* 24*   Platelets 645* 647* 585* 582*  --  561* 537* 540*  < > 494* 488*   < > = values in this interval not displayed.    Metabolic Panel:    Recent Labs  Lab 06/15/17  0206 06/14/17  0033 06/12/17  2331 06/12/17  0345 06/11/17  0201 06/10/17  0106   Sodium 132* 132* 132* 133 132* 132*   131*   Potassium 5.0 5.0 5.1 4.4 4.6 4.0   4.0   Chloride 95* 95* 97 96 95* 95*   96   CO2 '21 23 23 24 21 20   21   ' UN '8 10 11 12 12 14   15   ' Creatinine 0.54 0.53 0.53 0.54 0.56 0.57   0.57   Glucose 101* 103* 86 111* 119* 107*   105*   Calcium 8.2* 8.3* 8.4* 8.3* 8.2* 8.0*   8.2*   Magnesium  --   --  1.6  --   --  1.5*   Phosphorus  --   --  5.1*  --   --  4.3          Assessment:  73 y.o. female with h/o recent PE and pancreatic cancer POD # 77  status post whipple procedure complicated by delayed gastric emptying.  NGT replaced on 04/04/17. UGI on 04/12/17 concerning for obstruction just  beyond the [place] in the efferent limb.  Course complicated on 7/62/83 by rising LFTs and sepsis with CT concerning for cholangitis given biliary tree obstruction and PV compression due to hematoma and afferent limb distention in setting of PE treatment. Is s/p IR PTC on 04/16/17 and IR percutaneous aspiration of anterior abdominal fluid collection on 04/24/17. IR LUQ perc drain placement 3/1. IR anterior abdominal 35f perc drain placement 3/3. IR anterior perc drain into left hepatic collection 3/6.    Plan:  - Regular Diet with TF's  - Plan to continue IV antibiotic regimen (Ertapenem and Daptomycin) per ID Recommendations. Duration until resolution of Hepatic Abscess.  -  Hyponatremia: Stable from previous.  - Anemia; stable.  - Aggressive bowel regimen  - SCDs, Lovenox  - Dispo: Pending clinical course, resolution of acute infection. Pt has Physical Debilitation requiring a rehab setting. PT/OT recommending SNF.    Rudene Re, MD  06/15/2017     12:12 PM   General Surgery Resident    I saw and evaluated the patient. I agree with the resident's/fellow's findings and plan of care as documented above.    Vladimir Creeks, MD

## 2017-06-15 NOTE — Progress Notes (Signed)
Patient seen for G-tube check. G-tube output pale orange/yellow with minimal blood. Skin around G-tube clean, dry, and intact without erythema or breakdown. Minimal serous drainage on gauze around tube.     Flushed tube ports without issue. Ok to use G-tube at this time. Small holding tabs should fall off after 5 days, otherwise ok to gently retract and clip underlying suture. Please call IR if questions arise.

## 2017-06-15 NOTE — Plan of Care (Signed)
Problem: Safety  Goal: Patient will remain free of falls  Outcome: Maintaining      Problem: Pain/Comfort  Goal: Patient's pain or discomfort is manageable  Outcome: Progressing towards goal      Problem: Mobility  Goal: Functional status is maintained or improved - Geriatric  Outcome: Progressing towards goal    Goal: Patient's functional status is maintained or improved  Outcome: Progressing towards goal      Problem: Nutrition  Goal: Patient's nutritional status is maintained or improved  Outcome: Progressing towards goal    Goal: Nutritional status is maintained or improved - Geriatric  Outcome: Progressing towards goal      Problem: Bowel Elimination  Goal: Elimination patterns are normal or improving  Outcome: Progressing towards goal      Problem: Post-Operative Hemodynamic Stability  Goal: Maintain Hemodynamic Stability  Outcome: Progressing towards goal      Problem: Post-Operative Bowel Elimination  Goal: Elimination pattern is normal or improving  Outcome: Progressing towards goal      Problem: Psychosocial  Goal: Demonstrates ability to cope with illness  Outcome: Progressing towards goal      Problem: GI Bleeding Elimination  Goal: Elimination of patterns are normal or improving  Outcome: Maintaining

## 2017-06-15 NOTE — Plan of Care (Signed)
Nutrition     Nutritional status is maintained or improved - Geriatric Goal not met    Pt currently strict NPO after PEG placement.      Bowel Elimination     Elimination patterns are normal or improving Progressing towards goal        Fluid and Electrolyte Imbalance     Fluid and Electrolyte imbalance Progressing towards goal        GI Bleeding Elimination     Elimination of patterns are normal or improving Progressing towards goal        Mobility     Functional status is maintained or improved - Geriatric Progressing towards goal     Patient's functional status is maintained or improved Progressing towards goal        Nutrition     Patient's nutritional status is maintained or improved Progressing towards goal        Pain/Comfort     Patient's pain or discomfort is manageable Progressing towards goal        Post-Operative Bowel Elimination     Elimination pattern is normal or improving Progressing towards goal        Post-Operative Hemodynamic Stability     Maintain Hemodynamic Stability Progressing towards goal        Psychosocial     Demonstrates ability to cope with illness Progressing towards goal        Safety     Patient will remain free of falls Progressing towards goal

## 2017-06-16 LAB — BLOOD CULTURE: Bacterial Blood Culture: 0

## 2017-06-16 LAB — CBC AND DIFFERENTIAL
Baso # K/uL: 0 10*3/uL (ref 0.0–0.1)
Baso # K/uL: 0.1 10*3/uL (ref 0.0–0.1)
Basophil %: 0 %
Basophil %: 0.4 %
Eos # K/uL: 0 10*3/uL (ref 0.0–0.4)
Eos # K/uL: 0.3 10*3/uL (ref 0.0–0.4)
Eosinophil %: 0 %
Eosinophil %: 1.3 %
Hematocrit: 21 % — ABNORMAL LOW (ref 34–45)
Hematocrit: 22 % — ABNORMAL LOW (ref 34–45)
Hemoglobin: 6.8 g/dL — ABNORMAL LOW (ref 11.2–15.7)
Hemoglobin: 7 g/dL — ABNORMAL LOW (ref 11.2–15.7)
IMM Granulocytes #: 0.1 10*3/uL
IMM Granulocytes: 0.6 %
Lymph # K/uL: 0.6 10*3/uL — ABNORMAL LOW (ref 1.2–3.7)
Lymph # K/uL: 1.1 10*3/uL — ABNORMAL LOW (ref 1.2–3.7)
Lymphocyte %: 2.6 %
Lymphocyte %: 5.7 %
MCH: 28 pg/cell (ref 26–32)
MCH: 28 pg/cell (ref 26–32)
MCHC: 31 g/dL — ABNORMAL LOW (ref 32–36)
MCHC: 32 g/dL (ref 32–36)
MCV: 89 fL (ref 79–95)
MCV: 89 fL (ref 79–95)
Mono # K/uL: 1 10*3/uL — ABNORMAL HIGH (ref 0.2–0.9)
Mono # K/uL: 1.6 10*3/uL — ABNORMAL HIGH (ref 0.2–0.9)
Monocyte %: 5.1 %
Monocyte %: 8.6 %
Neut # K/uL: 15.9 10*3/uL — ABNORMAL HIGH (ref 1.6–6.1)
Neut # K/uL: 18.5 10*3/uL — ABNORMAL HIGH (ref 1.6–6.1)
Nucl RBC # K/uL: 0 10*3/uL (ref 0.0–0.0)
Nucl RBC # K/uL: 0 10*3/uL (ref 0.0–0.0)
Nucl RBC %: 0 /100 WBC (ref 0.0–0.2)
Nucl RBC %: 0 /100 WBC (ref 0.0–0.2)
Platelets: 523 10*3/uL — ABNORMAL HIGH (ref 160–370)
Platelets: 569 10*3/uL — ABNORMAL HIGH (ref 160–370)
RBC: 2.4 MIL/uL — ABNORMAL LOW (ref 3.9–5.2)
RBC: 2.5 MIL/uL — ABNORMAL LOW (ref 3.9–5.2)
RDW: 16.9 % — ABNORMAL HIGH (ref 11.7–14.4)
RDW: 16.9 % — ABNORMAL HIGH (ref 11.7–14.4)
Seg Neut %: 83.4 %
Seg Neut %: 92.3 %
WBC: 19 10*3/uL — ABNORMAL HIGH (ref 4.0–10.0)
WBC: 20.1 10*3/uL — ABNORMAL HIGH (ref 4.0–10.0)

## 2017-06-16 LAB — LEGIONELLA CULTURE: Legionella Culture: 0

## 2017-06-16 LAB — BASIC METABOLIC PANEL
Anion Gap: 12 (ref 7–16)
CO2: 23 mmol/L (ref 20–28)
Calcium: 7.8 mg/dL — ABNORMAL LOW (ref 8.6–10.2)
Chloride: 95 mmol/L — ABNORMAL LOW (ref 96–108)
Creatinine: 0.51 mg/dL (ref 0.51–0.95)
GFR,Black: 112 *
GFR,Caucasian: 97 *
Glucose: 159 mg/dL — ABNORMAL HIGH (ref 60–99)
Lab: 7 mg/dL (ref 6–20)
Potassium: 3.7 mmol/L (ref 3.3–5.1)
Sodium: 130 mmol/L — ABNORMAL LOW (ref 133–145)

## 2017-06-16 LAB — DIFF MANUAL: Diff Based On: 117 CELLS

## 2017-06-16 LAB — HCT AND HGB
Hematocrit: 22 % — ABNORMAL LOW (ref 34–45)
Hemoglobin: 7 g/dL — ABNORMAL LOW (ref 11.2–15.7)

## 2017-06-16 LAB — APTT
aPTT: 38.1 s — ABNORMAL HIGH (ref 25.8–37.9)
aPTT: 38.3 s — ABNORMAL HIGH (ref 25.8–37.9)

## 2017-06-16 LAB — MCHC: MCHC: 32 g/dL (ref 32–36)

## 2017-06-16 LAB — CRP: CRP: 248 mg/L — ABNORMAL HIGH (ref 0–10)

## 2017-06-16 MED ORDER — ENOXAPARIN SODIUM 80 MG/0.8ML IJ SOSY *I*
1.0000 mg/kg | PREFILLED_SYRINGE | Freq: Two times a day (BID) | INTRAMUSCULAR | Status: DC
Start: 2017-06-16 — End: 2017-06-18
  Administered 2017-06-16 – 2017-06-17 (×3): 80 mg via SUBCUTANEOUS
  Filled 2017-06-16 (×5): qty 0.8

## 2017-06-16 NOTE — Plan of Care (Signed)
Bowel Elimination     Elimination patterns are normal or improving Maintaining        Fluid and Electrolyte Imbalance     Fluid and Electrolyte imbalance Maintaining        GI Bleeding Elimination     Elimination of patterns are normal or improving Maintaining        Mobility     Functional status is maintained or improved - Geriatric Maintaining     Patient's functional status is maintained or improved Maintaining        Nutrition     Patient's nutritional status is maintained or improved Maintaining     Nutritional status is maintained or improved - Geriatric Maintaining        Pain/Comfort     Patient's pain or discomfort is manageable Maintaining        Post-Operative Bowel Elimination     Elimination pattern is normal or improving Maintaining        Post-Operative Hemodynamic Stability     Maintain Hemodynamic Stability Maintaining        Psychosocial     Demonstrates ability to cope with illness Maintaining        Safety     Patient will remain free of falls Maintaining

## 2017-06-16 NOTE — Progress Notes (Addendum)
Surgical Oncology/Hepatobiliary Surgery Progress Note     LOS: 79 days     Subjective:  Interval Events:   NAEO. Pt initiated on TFs with Regular Diet, pt is tolerating well. Pain well-controlled on current medication regimen. Has been OOB to chair yesterday.    Objective:  Vitals Sign Ranges for Past 24 Hours:  BP: (100-132)/(60-78)   Temp:  [36 C (96.8 F)-37.4 C (99.3 F)]   Temp src: Temporal (04/14 0521)  Heart Rate:  [89-113]   Resp:  [17-20]   SpO2:  [93 %-96 %]     Physical Exam:  General Appearance: in NAD  Cardiac: regular rate  Respiratory: non-labored breathing on room air  Abdomen: Soft, non tender to palpation, non distended, incision well healed   Extremities: warm, erythema of the bilateral anterior calves with small amount of skin breakdown, minimal edema no posterior calf tenderness     Labs:    CBC:    Recent Labs  Lab 06/16/17  0419 06/16/17  0218 06/15/17  0206 06/14/17  1805 06/14/17  0033 06/13/17  1149  06/12/17  2331  06/12/17  0345  06/11/17  0201   WBC  --  20.1* 14.6*  --  10.8*  --   --  11.6*  --  10.4*  --  9.8   Hemoglobin 7.0* 7.0* 8.0*  --  7.4*  --   < > 7.0*  --  7.1*  --  8.6*   Hematocrit 22* 22* 26*  --  24*  --   --  23*  --  22*  --  28*   Platelets  --  569* 645* 647* 585* 582*  --  561*  < > 540*  < > 494*   < > = values in this interval not displayed.    Metabolic Panel:    Recent Labs  Lab 06/16/17  0218 06/15/17  0206 06/14/17  0033 06/12/17  2331 06/12/17  0345 06/11/17  0201 06/10/17  0106   Sodium 130* 132* 132* 132* 133 132* 132*   131*   Potassium 3.7 5.0 5.0 5.1 4.4 4.6 4.0   4.0   Chloride 95* 95* 95* 97 96 95* 95*   96   CO2 _0 UN _1 Creatinine 0.51 0.54 0.53 0.53 0.54 0.56 0.57   0.57   Glucose 159* 101* 103* 86 111* 119* 107*   105*   Calcium 7.8* 8.2* 8.3* 8.4* 8.3* 8.2* 8.0*   8.2*   Magnesium  --   --   --  1.6  --   --  1.5*   Phosphorus  --   --   --  5.1*  --   --  4.3          Assessment:  72 y.o. female  with h/o recent PE and pancreatic cancer POD # 68  status post whipple procedure complicated by delayed gastric emptying.  NGT replaced on 04/04/17. UGI on 04/12/17 concerning for obstruction just beyond the [place] in the efferent limb.  Course complicated on 8/84/16 by rising LFTs and sepsis with CT concerning for cholangitis given biliary tree obstruction and PV compression due to hematoma and afferent limb distention in setting of PE treatment. Is s/p IR PTC on 04/16/17 and IR percutaneous aspiration of anterior abdominal fluid collection on 04/24/17. IR LUQ perc drain placement 3/1. IR anterior abdominal  50f perc drain placement 3/3. IR anterior perc drain into left hepatic collection 3/6.    Plan:  - Regular Diet with TF's  - Plan to continue IV antibiotic regimen (Ertapenem and Daptomycin) per ID Recommendations. Duration until resolution of Hepatic Abscess.  - Hyponatremia: Stable from previous.  - Anemia; stable.  - Aggressive bowel regimen  - SCDs, Heparin gtt now, will transition to therapeutic lovenox.  - Dispo: Pending clinical course, resolution of acute infection. Pt has Physical Debilitation requiring a rehab setting. PT/OT recommending SNF.    KBishop Dublin MD  06/16/2017     6:29 AM   General Surgery Resident    I saw and evaluated the patient. I agree with the resident's/fellow's findings and plan of care as documented above.    JVladimir Creeks MD

## 2017-06-17 ENCOUNTER — Inpatient Hospital Stay: Payer: Medicare (Managed Care)

## 2017-06-17 DIAGNOSIS — Z931 Gastrostomy status: Secondary | ICD-10-CM

## 2017-06-17 DIAGNOSIS — M797 Fibromyalgia: Secondary | ICD-10-CM

## 2017-06-17 DIAGNOSIS — T85848A Pain due to other internal prosthetic devices, implants and grafts, initial encounter: Secondary | ICD-10-CM

## 2017-06-17 LAB — COMPREHENSIVE METABOLIC PANEL
ALT: 37 U/L — ABNORMAL HIGH (ref 0–35)
AST: 37 U/L — ABNORMAL HIGH (ref 0–35)
Albumin: 2.1 g/dL — ABNORMAL LOW (ref 3.5–5.2)
Alk Phos: 474 U/L — ABNORMAL HIGH (ref 35–105)
Anion Gap: 13 (ref 7–16)
Bilirubin,Total: <0.2 mg/dL (ref 0.0–1.2)
CO2: 22 mmol/L (ref 20–28)
Calcium: 8 mg/dL — ABNORMAL LOW (ref 8.6–10.2)
Chloride: 94 mmol/L — ABNORMAL LOW (ref 96–108)
Creatinine: 0.46 mg/dL — ABNORMAL LOW (ref 0.51–0.95)
GFR,Black: 116 *
GFR,Caucasian: 100 *
Glucose: 88 mg/dL (ref 60–99)
Lab: 9 mg/dL (ref 6–20)
Potassium: 4.3 mmol/L (ref 3.3–5.1)
Sodium: 129 mmol/L — ABNORMAL LOW (ref 133–145)
Total Protein: 5.8 g/dL — ABNORMAL LOW (ref 6.3–7.7)

## 2017-06-17 LAB — PHOSPHORUS: Phosphorus: 3.4 mg/dL (ref 2.7–4.5)

## 2017-06-17 LAB — BLOOD CULTURE
Bacterial Blood Culture: 0
Bacterial Blood Culture: 0

## 2017-06-17 LAB — TRIGLYCERIDES: Triglycerides: 191 mg/dL — AB

## 2017-06-17 LAB — PROTIME-INR
INR: 1.4 — ABNORMAL HIGH (ref 0.9–1.1)
Protime: 16 s — ABNORMAL HIGH (ref 10.0–12.9)

## 2017-06-17 LAB — PREALBUMIN: Prealbumin: 9 mg/dL — ABNORMAL LOW (ref 20–40)

## 2017-06-17 LAB — MAGNESIUM: Magnesium: 1.5 mg/dL — ABNORMAL LOW (ref 1.6–2.5)

## 2017-06-17 MED ORDER — STERILE WATER FOR IRRIGATION IR SOLN *I*
900.0000 mL | Freq: Once | Status: AC
Start: 2017-06-17 — End: 2017-06-17
  Administered 2017-06-17: 900 mL via ORAL

## 2017-06-17 MED ORDER — SODIUM CHLORIDE 1 GM PO TABS *I*
1.0000 g | ORAL_TABLET | Freq: Three times a day (TID) | ORAL | Status: DC
Start: 2017-06-17 — End: 2017-06-27
  Administered 2017-06-17 (×2): 1 g via ORAL
  Filled 2017-06-17 (×34): qty 1

## 2017-06-17 MED ORDER — HYDROMORPHONE HCL 2 MG/ML IJ SOLN *WRAPPED*
0.5000 mg | Freq: Once | INTRAMUSCULAR | Status: AC
Start: 2017-06-17 — End: 2017-06-17
  Administered 2017-06-17: 0.5 mg via INTRAVENOUS
  Filled 2017-06-17 (×2): qty 1

## 2017-06-17 MED ORDER — HYDROMORPHONE HCL 2 MG/ML IJ SOLN *WRAPPED*
0.7500 mg | INTRAMUSCULAR | Status: DC | PRN
Start: 2017-06-17 — End: 2017-06-18
  Administered 2017-06-17 – 2017-06-18 (×7): 0.76 mg via INTRAVENOUS
  Filled 2017-06-17 (×9): qty 1

## 2017-06-17 MED ORDER — IOHEXOL 350 MG/ML (OMNIPAQUE) IV SOLN *I*
1.0000 mL | Freq: Once | INTRAVENOUS | Status: AC
Start: 2017-06-17 — End: 2017-06-17
  Administered 2017-06-17: 117 mL via INTRAVENOUS

## 2017-06-17 MED ORDER — HYDROMORPHONE HCL 4 MG PO TABS *I*
4.0000 mg | ORAL_TABLET | ORAL | Status: DC | PRN
Start: 2017-06-17 — End: 2017-06-19
  Administered 2017-06-17: 4 mg via ORAL
  Filled 2017-06-17: qty 1

## 2017-06-17 MED ORDER — MAGNESIUM SULFATE 2 GM IN 50 ML *WRAPPED*
2000.0000 mg | Freq: Once | INTRAVENOUS | Status: AC
Start: 2017-06-17 — End: 2017-06-17
  Administered 2017-06-17: 2000 mg via INTRAVENOUS
  Filled 2017-06-17: qty 50

## 2017-06-17 MED ORDER — HYDROMORPHONE HCL 2 MG/ML IJ SOLN *WRAPPED*
0.5000 mg | INTRAMUSCULAR | Status: DC | PRN
Start: 2017-06-17 — End: 2017-06-17
  Administered 2017-06-17 (×2): 0.5 mg via INTRAVENOUS
  Filled 2017-06-17 (×2): qty 1

## 2017-06-17 NOTE — Progress Notes (Signed)
Palliative Care Progress Note    HPI: [name] w/ PMH fibromyalgia, pancreatic CA s/p Whipple for 'cure' 03/29/17; s/p ICU w/ intub for resp failure & septic shock, weaned from pressors and extubated to HFNC 2/17 w/ IR drainage bili fluid collection 2/19, s/p IV abx, now all abd drains for abscesses removed. Palliative following for symptoms, started methadone 2/19, weaned dose 2/23 d/t concern for sedation. Pt w/ ongoing poor po intake, now tolerating bolus fds via dubhoff fdg tube. Unfortunately not able to get PEG w/o going to OR (anticipate 4/11), s/p R pleural pigtail for ~ 1 L drainage, now removed and + VRE from mediport, now removed 4/8. S/p PICC & PEG in IR on 4/12. Goal dispo SNF Rehab.    Subjective: "I'm in so much pain since they put the tube in"    Objective: chart reviewed, visited pt this afternoon, she was endorsing severe abd pain and tightness around new PEG; site looks ok & has been used w/o difficulty, no significant drainage or redness; states the dilaudid prn is not helping, asked about getting PCA; discussed increasing the dilaudid doses for now as hope her pain will settle down in a day or so; pt going for CT abd/pelvis to eval for other potential causes of increased pain; pt feeling down, validated her experiences and offered that we are hopeful she would continue to improve and overcome these setbacks, reiterated her cancer was treated for cure w/ surgery and her infections are improving on abx sensitive to treat     Palliative Care ROS:  Pain mod to severe tenderness around new PEG site  Nausea no  Dyspnea no  Weakness yes  Constipation no  Delirum no  Last stool 4/13    Physical Examination:   BP: (100-114)/(56-70)   Temp:  [35.9 C (96.6 F)-37.2 C (99 F)]   Temp src: Temporal (04/15 1642)  Heart Rate:  [94-104]   Resp:  [16-20]   SpO2:  [93 %-95 %]   Gen appearance: [name], sitting up in bed, feeling tired & depressed, endorses mod to severe pain around new PEG tube  -lido patch near site  Lungs: easy resp, RA  Extrem: mod LE edema, reddened skin LEs  Skin: warm, dry  Neuro:  A+O x 3; feeling very weak and not wanting to get OOB today; at baseline ambulates short distances w/ walker & standby 1 assist     Assessment/Plan: [name] w/ pancreatic CA, s/p Whipple in Jan. s/p TPN, s/p perc drains and IV abx w/ resolving abd abscesses, s/p pigtail L pleural effusion, s/p mediport removal d/t VRE bacteremia & PICC placement. S/p PEG placement in IR on 4/12 w/ increased pain at site since. Pt using dilaudid prn freq, warrants increase in dose as hope this will be short lived. Pt planning CT abd/pelvis today to eval for other causes of pain.    Pain/dyspnea  Acetaminophen 1 gm po TID  Methadone  5 mg po TID (hold for sedation) new dose started 4/13  Dilaudid 0.5 mg IV Q 2 hrs prn (x 7 on 4/14, x 5 on 4/15) -increase dose to 0.75 mg  Dilaudid 2 mg po Q 4 hrs prn (x 4 on 4/14, x 4 on 4/15) -increase dose to 4 mg   Lidocaine patch daily x 2 (abd, LUE)  Ibuprofen 600 mg po Q 6 hrs prn   Encourage incentive spirometry prn     Anxiety/agitation/nausea/fatigue  Duloxetine 20 mg po daily  S/p Reglan 10 mg po 4x/d (AC & QHS) -now discontinued 4/14, consider restarting if issues w/ PEG residuals or constipation  Pantoprazole 40 mg po BID -now discontinued 4/14, consider restarting via PEG   Calcium carbonate 1 gm po BID prn   Ondansetron 4 mg IV Q 6 hrs prn (x 1 on 4/14, x 1 on 4/15)  Ritalin 2.5 mg po at 8 am and 2.5 mg at 12 pm (reduced dose 3/26) per Phmd unlikely ritalin is cause of mild confusion or appetite suppression  Promethazine 12.5 mg IV Q 6 hrs prn (last x 1 on 4/12)  S/p introduction of aromatherapy personal inhaler "Rayah's blend" & "Stop, Think, Breathe" phone app for relaxation    Prevention of constipation, last stool 4/13  Colace 200 mg po BID  Miralax 17 gm po daily  Senna 2 tab po BID (took 4/13)  Bisacodyl supp pr daily (hasn't been getting)    Dry flaky edematous  Skin  Bacitracin oint BID to BLEs  Eucerin prn for BLEs    GOC/prognosis  Full code  Dtr Rod Mae is HCP  Dispo plans now for SNF Rehab and may likely need LTC for a period in addition   4/1 initiating trial of PO meals first & bolus fds after if not eating at least 50% of meal.  S/p PEG fdg tube in IR 4/12; pt w/ increased pain post & needs encouragement to optimize her functional status    Pt case discussed w/ RN and covering provider aware of rec's re dilaudid dosing.    We will continue to follow along, please call if questions/concerns.     Total Time Spent 40 minutes: >50% of time was spent in counseling and/or coordination of care.     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    Hypothyroidism E03.9    Pancreatic cancer C25.9    Anemia D64.9     hx of Pulmonary embolism I26.99    Depression F32.9    Sepsis A41.9       No Known Allergies (drug, envir, food or latex)    Scheduled Meds:    sodium chloride  1 g Oral TID WC    enoxaparin  1 mg/kg Subcutaneous Q12H    ferrous sulfate  325 mg Oral Daily with breakfast    lactobacillus rhamnosus (GG)  1 capsule Oral Daily    methadone  5 mg Oral 3 times per day    fluconazole (DIFLUCAN) IV  800 mg Intravenous Q24H    ertapenem  1,000 mg Intravenous Q24H    daptomycin IV  8 mg/kg Intravenous Q24H    methylphenidate  2.5 mg Oral BID    polyethylene glycol  17 g Per G Tube Daily    lidocaine  1 patch Transdermal Q24H    triamterene-hydrochlorothiazide  1 tablet Oral QAM    lidocaine  1 patch Transdermal Q24H    senna  2 tablet Oral 2 times per day    bisacodyl  10 mg Rectal Daily    DULoxetine  20 mg Oral Daily    docusate sodium  200 mg Oral 2 times per day    acetaminophen   1,000 mg Oral Q8H    levothyroxine  137 mcg Oral  Daily       Continuous Infusions:    sodium chloride         PRN Meds:  HYDROmorphone PF, HYDROmorphone, mineral oil-hydrophilic petrolatum, promethazine, sodium chloride, sodium chloride, sodium chloride, dextrose, ibuprofen, naloxone, sodium chloride, dextrose, calcium carbonate, heparin lock flush, ondansetron

## 2017-06-17 NOTE — Plan of Care (Signed)
Bowel Elimination     Elimination patterns are normal or improving Maintaining        Fluid and Electrolyte Imbalance     Fluid and Electrolyte imbalance Maintaining        GI Bleeding Elimination     Elimination of patterns are normal or improving Maintaining        Mobility     Functional status is maintained or improved - Geriatric Maintaining     Patient's functional status is maintained or improved Maintaining        Nutrition     Patient's nutritional status is maintained or improved Maintaining     Nutritional status is maintained or improved - Geriatric Maintaining        Pain/Comfort     Patient's pain or discomfort is manageable Maintaining        Post-Operative Bowel Elimination     Elimination pattern is normal or improving Maintaining        Post-Operative Hemodynamic Stability     Maintain Hemodynamic Stability Maintaining        Psychosocial     Demonstrates ability to cope with illness Maintaining        Safety     Patient will remain free of falls Maintaining

## 2017-06-17 NOTE — Progress Notes (Signed)
Report Given   Handoff received from [place] on unit [facility]. Aware of patient coming to [facility] for CT of ABD. and pelvis with oral and IV contrast.         Procedure Summary

## 2017-06-17 NOTE — Progress Notes (Addendum)
Infectious Diseases (Team 3) Follow Up Note    Personally reviewed chart, labs, medications. Patient seen.     CC:  F/U VRE bacteremia, Liver and abdominal abscesses  F/U Enterobacter cloacae bacteremia, Intra-abdominal abscesses positive for VRE, enterobacter and candida glabrata    Subjective: reporting quite a bit of pain at and around PEG insertion site, team working with pain med adjustments but she has not found relief yet.  She denies N/V/D, F/C.  Appears uncomfortable, daughters at bedside, report PEG site pain limiting Farah's activity and now virtually no PO intake.  Tolerating TFs via the PEG.     ROS: Reviewed 6 systems in detail, see above    Current Meds:  Scheduled Meds:   sodium chloride  1 g Oral TID WC    enoxaparin  1 mg/kg Subcutaneous Q12H    ferrous sulfate  325 mg Oral Daily with breakfast    lactobacillus rhamnosus (GG)  1 capsule Oral Daily    methadone  5 mg Oral 3 times per day    fluconazole (DIFLUCAN) IV  800 mg Intravenous Q24H    ertapenem  1,000 mg Intravenous Q24H    daptomycin IV  8 mg/kg Intravenous Q24H    methylphenidate  2.5 mg Oral BID    polyethylene glycol  17 g Per G Tube Daily    lidocaine  1 patch Transdermal Q24H    triamterene-hydrochlorothiazide  1 tablet Oral QAM    lidocaine  1 patch Transdermal Q24H    senna  2 tablet Oral 2 times per day    bisacodyl  10 mg Rectal Daily    DULoxetine  20 mg Oral Daily    docusate sodium  200 mg Oral 2 times per day    acetaminophen  1,000 mg Oral Q8H    levothyroxine  137 mcg Oral Daily     Continuous Infusions:   sodium chloride       PRN Meds:.   calcium carbonate  1,000 mg Oral BID PRN    HYDROmorphone PF  0.5 mg Intravenous Q1H PRN    Or    HYDROmorphone PF  1 mg Intravenous Q1H PRN    LORazepam  0.5 mg Oral Q6H PRN    heparin lock flush  50 Units Intracatheter PRN    ondansetron  4 mg Intravenous Q6H PRN       Objective:    BP: (100-114)/(56-70)   Temp:  [35.9 C (96.6 F)-37.2 C (99 F)]   Temp src:  Temporal (04/15 1642)  Heart Rate:  [94-104]   Resp:  [16-20]   SpO2:  [93 %-95 %]     General Appearance: laying in bed, grimacing  HEENT: OP clear  Pulm/Chest: diminished with fine crackles bibasilar   CV: RRR, Normal S1S2, no M/R/G  Abdomen:  Distended, TTP, firm across upper abdomen, hypoactive bowel sounds, PEG tube site benign (very TTP around site, no erythema or drainage)  Extremities: MAE, WWP  Skin:Warm and dry to touch; no rashes  Neuro: alert and oriented, hypophonia   Lines/Drains/Tubes: 4/9 PIV R wrist, 4/9 RFA, PICC LUE 4/11,  PEG as above (4/12)      Recent Labs  Lab 06/16/17  2332 06/16/17  0419 06/16/17  0218 06/15/17  0206   WBC 19.0*  --  20.1* 14.6*   Hemoglobin 6.8* 7.0* 7.0* 8.0*   Hematocrit 21* 22* 22* 26*   Platelets 523*  --  569* 645*   Seg Neut % 83.4  --  92.3  82.1   Lymphocyte % 5.7  --  2.6 9.9   Monocyte % 8.6  --  5.1 6.0   Eosinophil % 1.3  --  0.0 0.5         Recent Labs  Lab 06/16/17  2332 06/16/17  0218 06/15/17  0206   Sodium 129* 130* 132*   Potassium 4.3 3.7 5.0   CO2 _0 UN _1 Creatinine 0.46* 0.51 0.54   Glucose 88 159* 101*   Calcium 8.0* 7.8* 8.2*         Lab results: 06/16/17  2332 06/10/17  0106 06/07/17  0031 06/06/17  0114 06/05/17  2305   Total Protein 5.8* 6.3 6.7 6.8 7.7   Albumin 2.1* 2.2* 2.3* 2.4* 2.6*   ALT 37* 57* 49* 47* 49*   AST 37* 91* 76* 69* 68*   Alk Phos 474* 407* 453* 489* 535*   Bilirubin,Total <0.2 <0.2 <0.2 <0.2 <0.2       Lab Results  Component Value Date/Time   CRP 248 (H) 06/16/2017 0218   CRP 170 (H) 06/15/2017 0206   CRP 157 (H) 06/14/2017 0525       Lab Results  Component Value Date/Time   Sedimentation Rate 65 (H) 06/13/2017 1807   Sedimentation Rate 69 (H) 06/09/2017 0401   Sedimentation Rate 35 (H) 06/07/2017 0031         Lab results: 06/12/17  0345   CK 47       Micro:   1/25: Intraoperative bile: GS 0 pmns, no organisms, Cx: no growth  2/12: 1 set of blood cultures from the right Mediport (no peripheral bld was sent):  positive for Enterobacter cloacae complex in 70 hrs, sensitive to zosyn.   2/18 abdominal GS had >25 PMNs, many GPC in pairs/chains, many GNB and cultures 4+ Enterobacter cloacae complex. The cultures also grew 1+ Candida glabrata   2/21: BCx from the Right peripheral IV, Mediport, L IJ:  No growth  05/03/17: IR-guided I&D with drain placement of the left upper quadrant collection: GS > 25 PMNs, many GNB and GN diplococci, Cultures with 4+ Enterobacter cloacae complex (resistant to Zosyn and CTX), C. Albicans and C. Glabrata, and Candida tropicalis  05/04/17:    1 set of blood cultures from left IJ: VRE at 15.3 TTP. Daptomycin sensitivities: MIC 72mg/ml (dose dependent),  Linezolid   1 set from periphery: VRE and Enterobacter cloacae at 14.8 TTP   05/05/17:  IR-guided I&D with drain placement of the intrahepatic collection: GS 1-10 PMNs, very few GPC in pairs. Fungal cultures with Candida glabrata  05/05/17: Blood cultures from periphery, Mediport and IJ: no growth  05/04/17: Urine cultures obtained (for unclear reason): no growth  05/08/17: Abscess GS: >25 PMNs, many GPC in chains, few GNB. Aerobic cultures growing 2+ Enterobacter (ertapenem, Cipro, TMP/SMX-sensitive) and 3+VRE, Fungal: C. Glabrata (sens to caspo, vori and dose dependent sens to fluconazole)    06/06/17: 2 sets of BCx (1 set each from periphery and Mediport) - taken after restarting ertapenem: IVAD grew VRE ttp 18.2 hours,  Periphery (Left arm) grew VRE ttp 17.3.    06/07/17 Pleural fluid (left) GS 1-10 PMN's, 1-10 Nucleated white cells, No organisms seen and 1604 nucleated cells. No growth on culture.     06/07/17 Abdominal abscess: labs cancelled - no specimens received.     06/08/17: Mediport BC + for VRE in 23 hrs (R-Amp, R- tetracycline per lab, suppressed result, R- doxycycline;  S-Linezolid, S-Dapto dose dependent),  Right hand peripheral cx: No growth     06/10/17: Blood culture 2/2: 1 periphery (RHand), 1 from Mediport before removed: no growth    06/11/17:  Blood Culture 2/2 (both periphery) - no growth     Imaging/Other Relevant Diagnostics:     4/12 CXR: The lungs are hypoexpanded with persistent lower lung zone opacities, no significant change. There is a probable trace left pleural effusion. No pneumothorax.      Assessment and Plan:  ID problem(s):   -Enterobacter cloacae bacteremia  -Multiple and extensive Intra-abdominal abscesses growing VRE, enterobacter and candida glabrata    72yo old female with pancreatic cancer, s/p Whipple procedure on 03/29/17 after neoadjuvant chemotherapy and radiation. Her post op course was c/b delayed gastric emptying, gastric distension and obstruction and a large surgical bed hematoma causing CBD and hepatic vein compression, severe biliary sepsis, s/p placement of a right hepatic duct external biliary drain placed on 2/12, found to have enterobacter cloacae bacteremia (2/12) presumed to be secondary to infection of the hematoma. However CT of abdomen (2/18) showed the hematoma had resolved but noted to have a well-circumscribed and larger anterior abdominal collection percutaneously, which was aspirated (2/19) and cultures grew enterobacter cloacae and Candida glabrata. ID service consulted on 2/21 with question of treating C.glabrata.  Caspofungin was started and a perc drain was placed LUQ 3/1, then anterior abdominal perc drain placed 3/3, and then anterior perc drain into left hepatic collection 05/08/17, the goal being source control with drain placements. She had been on Zosyn but her enterobacter became (amp-C inducible) thus switched to ertapenem and Daptomycin added on 05/04/17 for continued broad coverage.  Caspofungin was switched to oral high-dose fluconazole ('12mg'$ /kg) daily once sensitivities returned on C.glabrata. Pt on methadone for pain control, QTc 409. Abdominal perc drains where removed once drainage was minimal, biliary tube was removed last week. However, there remained several communicating multiloculated  fluid collections in LUQ and some areas of reaccumulation on 05/23/17 CT of a/p.    Summary of abdominal drain removals:   Mid Abd 3/17  LUQ 3/19  Biliary Tube 3/26  Right lateral Abd 3/27    Patient was switched over to oral therapy on 05/30/17 in hopes to ready her for SNF Rehab, ie, Bactrim for enterobacter and to remain on high dose fluconazole for C. Glabrata, with plan to reimage to fluid collections.   Linezolid was considered for VRE but ultimately was not a good option for her given risk vs benefits 2/2 to multiple drug-to-drug interactions (consulted with ID pharmacist who agreed).  Skyra initially was stable on Bactrim and fluconazole and ID (Team 3) signed off on 06/03/17.   ID Fellow team was re-consulted on 4/4 fpr pt rigors/lethargy/tachycardia (concern for early sepsis), however as afebrile and no leukocytosis at that time. A CT A/P on 06/06/17, compared to CT from 05/23/17, showed interval development of a new loculated 3 cm fluid collection in the right hepatic lobe, while the hepatic fluid collections remain stable in size. CT chest on 06/06/17 moderate left pleural effusion (no change from before). She as restarted ertapenem 1gm daily and daptomycin at '8mg'$ /kg as she was on prior to oral regimen, and continued on fluconazole '800mg'$  daily.  She had a left thoracentesis on 4/5, pigtail placed, pleural fluid analysis showed exudative and 1604 nucleated cells. No growth on cultures, pigtail removed on 06/09/17.  Blood cultures from 4/4, both periphery and Mediport grew VRE, and 4/6 and 4/8 only Mediport  positive for VRE. Initially VRE bacteremia due to the pelvic abscesses, but now VRE seeded mediport which was removed on 06/10/17.   A PICC was placed in LUE on 4/11 (48 hours of negative blood cultures and afebrile).     Shela Leff had a PEG placed on Friday 4/12. She is reporting significant pain at site and generally in area of PEG, no erythema, mild edema. Her primary and palliative care teams are working on pain  medication regimen and other measures to control pain.  She developed a leukocytosis after the PEG was placed, up to 20K, and CRP elevated to 248, in the absence of fever or report of chills or alteration in mental status. Will continue to monitor.     Recommendations/plan:  -  Daptomycin at 42m/kg (VRE), still on Ertapenem 1 gram daily (ok to change to TMP/SMX DS 1 tablet every 12 hours for Enterobacter coverage), and fluconazole 8047mdaily (C.glabrata) at least through 06/20/17,duration TBD by clinical course and imaging.   - Repeat abdominal/pelvis contrasted CT in 2 weeks from the last a/p CT (ie on 06/20/17) to reassess the size of the liver and abdominal-pelvic abscesses which will help determine further duration of the Abx.  - Please follow daily CBC with diff until WBC normalizes   - Please obtain a CRP and ESR with next blood draw   - Weekly liver chemistries while on the fluconazole  - Weekly CK while on Daptomycin  - BMP at least twice weekly (or more often as indicated)  - ID team 3 will follow along      Thank you for allowing [facility] participate in the care of this patient  Please call with questions     VaLonzo CandyANP-BC  Infectious Diseases, Team 3  Pager [phone]     Addendum 4/16: correction/clarification of antibiotic regimen was made, patient remains on Ertapenem rather than po bactrim.

## 2017-06-17 NOTE — Consults (Signed)
Medical Nutrition Therapy - Follow Up    Admit Date: 03/29/2017    Patient Summary: 72 y.o. female with h/o recent PE and pancreatic cancer status post whipple procedure complicated by delayed gastric emptying.  NGT replaced on 04/04/17. UGI on 04/12/17 concerning for obstruction just beyond the [place] in the efferent limb.  Course complicated on 09/04/48 by rising LFTs and sepsis with CT concerning for cholangitis given biliary tree obstruction and PV compression due to hematoma and afferent limb distention in setting of PE treatment. Is s/p IR PTC on 04/16/17 and IR percutaneous aspiration of anterior abdominal fluid collection on 04/24/17. IR LUQ perc drain placement 3/1. IR anterior abdominal 58f perc drain placement 3/3. IR anterior perc drain into left hepatic collection 3/6.    Pertinent Meds: levothyroxine, bowel regimen, duloxetine, Maxzide, Ritalin, Cubicin, ferrous sulfate, culturelle, methadone, Diflucan, ertapenem, NaCl tablets, Mg sulfate   Pertinent Labs: Na 129 (L); Cl 94 (L); Ca 9.52 (WNL corrected for albumin 2.1); Mg 1.5 (L); BGs range 88-159 past 24 hours     Reviewed I/O's: 480 ml PO yesterday     Enteral or parenteral access: PEG, PICC also noted     Food allergies: NKFA    Current diet: regular   TF order: Osmolite 1.5 270 ml QID with FWF 30 ml before and after each feed  Supplements: none     Nutrition Focused Physical Exam:    Edema: +1 generalized and abdominal, +2 BLE   Abdomen: rounded, tenderness/guarding, loss of appetite, +BS, +flatus   Skin: erythema, blanchable sacral redness, abrasion to elbow, skin tear to elbow and back       Anthropometrics:  Height: 170.2 cm ('5\' 7"' )    Current Weight: 78.6 kg (173 lb 4.8 oz) (4/2); 109% IBW; 109% UBW  Ideal Body Weight: 72.4 kg + 10%  BMI: 27.1 kg/(m^2) overweight with edema noted   Weight Hx: no new weight       Estimated Nutrient Needs: (Based on 64.9 kg) *BW with minimal edema      1630-1950 kcal/day (25-30 kcal/kg)   78-87 g protein/day (1.2-1.5 g/kg)     1630-1950 mL fluid/day (25-30 mL/kg)      Nutrition Assessment and Diagnosis:   Now s/p PEG placement. Pt is happy to have the tube out of her nose. She states she still isn't eating well because of a poor appetite and fatigue. Explained that she is getting the calories, protein, vitamins and minerals in her tube feeds. Pt reports she is drinking water. Intakes 25%. TFs provide 1620 kcal, 68 g protein and 1061 ml free water daily.     Malnutrition Status: Pt diagnosed with moderate malnutrition 04/09/17.      Nutrition Intervention:   1. Continue TFs as ordered. Continue to adjust FWF prn based on po fluid intake and fluid status.   2. Continue to monitor weekly weight, I/Os and % po intake- appreciate RN assistance  3. Continue to monitor BMP, Mg and PO4 and replete as needed  4. Continue regular diet, encourage po intake     Nutrition Monitoring/Evaluation:   1. Will monitor diet tolerance and intake, nutrition-related labs, weight trend, BM pattern acceptance.   2. Will follow up per high nutrition risk protocol.    LLaurence Aly RSan [place] Pager [phone]

## 2017-06-17 NOTE — Progress Notes (Signed)
06/17/17 0800   [facility] Patient Class Review   Patient Class Review Inpatient   Patient Class Effective 03/29/2017  Earnest Conroy RN   Utilization  Management  X [phone]  Page [phone]

## 2017-06-17 NOTE — Progress Notes (Addendum)
Surgical Oncology/Hepatobiliary Surgery Progress Note     LOS: 80 days     Subjective:  Interval Events:   Complains of pain around G tube site this morning. Tolerating cycled TFs with Regular Diet.    Objective:  Vitals Sign Ranges for Past 24 Hours:  BP: (98-112)/(56-70)   Temp:  [35.9 C (96.6 F)-36.3 C (97.3 F)]   Temp src: Temporal (04/15 0508)  Heart Rate:  [95-105]   Resp:  [16-20]   SpO2:  [93 %-95 %]     Physical Exam:  General Appearance: Resting in bed, in NAD  Cardiac: regular rate  Respiratory: non-labored breathing on room air  Abdomen: Soft, non tender to palpation, non distended, G tube site c/d/i  Extremities: warm, erythema of the bilateral anterior calves with small amount of skin breakdown, minimal edema no posterior calf tenderness     Labs:    CBC:    Recent Labs  Lab 06/16/17  2332 06/16/17  0419 06/16/17  0218 06/15/17  0206 06/14/17  1805 06/14/17  0033 06/13/17  1149  06/12/17  2331  06/12/17  0345   WBC 19.0*  --  20.1* 14.6*  --  10.8*  --   --  11.6*  --  10.4*   Hemoglobin 6.8* 7.0* 7.0* 8.0*  --  7.4*  --   < > 7.0*  --  7.1*   Hematocrit 21* 22* 22* 26*  --  24*  --   --  23*  --  22*   Platelets 523*  --  569* 645* 647* 585* 582*  --  561*  < > 540*   < > = values in this interval not displayed.    Metabolic Panel:    Recent Labs  Lab 06/16/17  2332 06/16/17  0218 06/15/17  0206 06/14/17  0033 06/12/17  2331 06/12/17  0345   Sodium 129* 130* 132* 132* 132* 133   Potassium 4.3 3.7 5.0 5.0 5.1 4.4   Chloride 94* 95* 95* 95* 97 96   CO2 _0 UN _1 Creatinine 0.46* 0.51 0.54 0.53 0.53 0.54   Glucose 88 159* 101* 103* 86 111*   Calcium 8.0* 7.8* 8.2* 8.3* 8.4* 8.3*   Magnesium 1.5*  --   --   --  1.6  --    Phosphorus 3.4  --   --   --  5.1*  --           Assessment:  72 y.o. female with h/o recent PE and pancreatic cancer POD # 80  status post whipple procedure complicated by delayed gastric emptying.  NGT replaced on 04/04/17. UGI on 04/12/17 concerning for  obstruction just beyond the [place] in the efferent limb.  Course complicated on 9/93/71 by rising LFTs and sepsis with CT concerning for cholangitis given biliary tree obstruction and PV compression due to hematoma and afferent limb distention in setting of PE treatment. Is s/p IR PTC on 04/16/17 and IR percutaneous aspiration of anterior abdominal fluid collection on 04/24/17. IR LUQ perc drain placement 3/1. IR anterior abdominal 51f perc drain placement 3/3. IR anterior perc drain into left hepatic collection 3/6.    Plan:  - Regular Diet with TF's  - Plan to continue IV antibiotic regimen (Ertapenem and Daptomycin) per ID Recommendations. Duration until resolution of Hepatic Abscess.  - Hyponatremia, will start salt tabs today  - Anemia; stable.  - Aggressive bowel  regimen  - SCDs, therapeutic lovenox.  - Dispo: Pending clinical course, resolution of acute infection. Pt has Physical Debilitation requiring a rehab setting. PT/OT recommending SNF.    Cherylann Banas, MD  06/17/2017     8:01 AM   General Surgery Resident    Hepatobiliary, Pancreas & GI Surgery     Patient discussed with residents. Agree with note above. CT scan for evaluation of liver abscess in setting of increased WBC.     Patient off unit at CT scan during rounds.      Delano Metz, MD

## 2017-06-18 ENCOUNTER — Inpatient Hospital Stay: Payer: Medicare (Managed Care)

## 2017-06-18 ENCOUNTER — Ambulatory Visit: Payer: Medicare (Managed Care) | Admitting: Infectious Diseases

## 2017-06-18 ENCOUNTER — Ambulatory Visit: Payer: Medicare (Managed Care) | Admitting: Infectious Disease

## 2017-06-18 DIAGNOSIS — F419 Anxiety disorder, unspecified: Secondary | ICD-10-CM

## 2017-06-18 DIAGNOSIS — R935 Abnormal findings on diagnostic imaging of other abdominal regions, including retroperitoneum: Secondary | ICD-10-CM

## 2017-06-18 LAB — CBC AND DIFFERENTIAL
Baso # K/uL: 0.1 10*3/uL (ref 0.0–0.1)
Basophil %: 0.5 %
Eos # K/uL: 0.4 10*3/uL (ref 0.0–0.4)
Eosinophil %: 2.9 %
Hematocrit: 23 % — ABNORMAL LOW (ref 34–45)
Hemoglobin: 7 g/dL — ABNORMAL LOW (ref 11.2–15.7)
IMM Granulocytes #: 0.1 10*3/uL
IMM Granulocytes: 0.8 %
Lymph # K/uL: 1.3 10*3/uL (ref 1.2–3.7)
Lymphocyte %: 9.2 %
MCH: 28 pg/cell (ref 26–32)
MCHC: 31 g/dL — ABNORMAL LOW (ref 32–36)
MCV: 90 fL (ref 79–95)
Mono # K/uL: 1 10*3/uL — ABNORMAL HIGH (ref 0.2–0.9)
Monocyte %: 6.8 %
Neut # K/uL: 11.6 10*3/uL — ABNORMAL HIGH (ref 1.6–6.1)
Nucl RBC # K/uL: 0 10*3/uL (ref 0.0–0.0)
Nucl RBC %: 0.1 /100 WBC (ref 0.0–0.2)
Platelets: 558 10*3/uL — ABNORMAL HIGH (ref 160–370)
RBC: 2.5 MIL/uL — ABNORMAL LOW (ref 3.9–5.2)
RDW: 17 % — ABNORMAL HIGH (ref 11.7–14.4)
Seg Neut %: 79.8 %
WBC: 14.5 10*3/uL — ABNORMAL HIGH (ref 4.0–10.0)

## 2017-06-18 LAB — BASIC METABOLIC PANEL
Anion Gap: 14 (ref 7–16)
CO2: 23 mmol/L (ref 20–28)
Calcium: 8.1 mg/dL — ABNORMAL LOW (ref 8.6–10.2)
Chloride: 92 mmol/L — ABNORMAL LOW (ref 96–108)
Creatinine: 0.44 mg/dL — ABNORMAL LOW (ref 0.51–0.95)
GFR,Black: 118 *
GFR,Caucasian: 102 *
Glucose: 91 mg/dL (ref 60–99)
Lab: 10 mg/dL (ref 6–20)
Potassium: 4.4 mmol/L (ref 3.3–5.1)
Sodium: 129 mmol/L — ABNORMAL LOW (ref 133–145)

## 2017-06-18 LAB — SEDIMENTATION RATE, AUTOMATED: Sedimentation Rate: 54 mm/hr — ABNORMAL HIGH (ref 0–30)

## 2017-06-18 LAB — CRP: CRP: 282 mg/L — ABNORMAL HIGH (ref 0–10)

## 2017-06-18 LAB — POCT GLUCOSE: Glucose POCT: 93 mg/dL (ref 60–99)

## 2017-06-18 LAB — PROTIME-INR
INR: 1.4 — ABNORMAL HIGH (ref 0.9–1.1)
Protime: 15.3 s — ABNORMAL HIGH (ref 10.0–12.9)

## 2017-06-18 MED ORDER — HYDROMORPHONE PCA 1 MG/ML *WRAPPED*
INTRAMUSCULAR | Status: AC
Start: 2017-06-18 — End: 2017-07-19
  Filled 2017-06-18 (×42): qty 25

## 2017-06-18 MED ORDER — NALOXONE HCL 0.4 MG/ML IJ SOLN *WRAPPED*
0.1000 mg | Status: AC | PRN
Start: 2017-06-18 — End: 2017-08-09

## 2017-06-18 MED ORDER — METHADONE HCL 10 MG/ML IJ SOLN *I*
2.5000 mg | Freq: Three times a day (TID) | INTRAMUSCULAR | Status: DC
Start: 2017-06-18 — End: 2017-06-25
  Administered 2017-06-19 – 2017-06-23 (×14): 2.5 mg via INTRAVENOUS
  Filled 2017-06-18 (×25): qty 0.25

## 2017-06-18 MED ORDER — HYDROMORPHONE HCL 2 MG/ML IJ SOLN *WRAPPED*
0.5000 mg | INTRAMUSCULAR | Status: DC | PRN
Start: 2017-06-18 — End: 2017-06-18
  Administered 2017-06-18: 0.5 mg via INTRAVENOUS
  Filled 2017-06-18: qty 1

## 2017-06-18 MED ORDER — SODIUM CHLORIDE 0.9 % IV SOLN WRAPPED *I*
10.0000 mg/kg | INTRAVENOUS | Status: DC
Start: 2017-06-18 — End: 2017-07-04
  Administered 2017-06-18 – 2017-07-03 (×16): 800 mg via INTRAVENOUS
  Filled 2017-06-18 (×18): qty 16

## 2017-06-18 MED ORDER — HYDROMORPHONE HCL 2 MG/ML IJ SOLN *WRAPPED*
1.0000 mg | INTRAMUSCULAR | Status: DC | PRN
Start: 2017-06-18 — End: 2017-06-22
  Administered 2017-06-18 – 2017-06-21 (×11): 1 mg via INTRAVENOUS
  Filled 2017-06-18 (×11): qty 1

## 2017-06-18 MED ORDER — LACTATED RINGERS IV SOLN *I*
100.0000 mL/h | INTRAVENOUS | Status: DC
Start: 2017-06-18 — End: 2017-06-19
  Administered 2017-06-18 (×4): 100 mL/h
  Administered 2017-06-18: 100 mL/h via INTRAVENOUS
  Administered 2017-06-18 (×6): 100 mL/h
  Administered 2017-06-18: 100 mL/h via INTRAVENOUS
  Administered 2017-06-19 (×8): 100 mL/h
  Administered 2017-06-19: 100 mL/h via INTRAVENOUS

## 2017-06-18 MED ORDER — METHADONE HCL 5 MG PO TABS *I*
2.5000 mg | ORAL_TABLET | Freq: Three times a day (TID) | ORAL | Status: DC
Start: 2017-06-18 — End: 2017-06-18

## 2017-06-18 NOTE — Progress Notes (Signed)
Amanda Galloway is currently functioning below her baseline, however is demonstrating the endurance to tolerate an intense therapy program. Recommend PM&R consult be placed at this time. However, if discharge directly to home is desired, Amanda Galloway will need 24 Hour assistance with all mobility and require Home PT. PT Discharge Equipment Recommended: To be determined if returning to prior living environment today.        Physical Therapy Treatment Note:     06/18/17 1600   Visit Details [facility])   Visit Type [facility]) Follow Up   Reason for visit [facility]) [place]   Visit Number   Visit Number [facility]) / Treatment Day [facility]) 12   Precautions/Observations   Precautions used Yes   LDA Observation Monitors   Fall Precautions General falls precautions   Other Pt supine in bed, agreeable with encouragement. Daughter present and supportive. Seen with RN permission.   Pain Assessment   *Is the patient currently in pain? Yes   Pain (Before,During, After) Therapy During   0-10 Scale 6   Pain Location Abdomen   Pain Intervention(s) Refer to nursing for pain management;Repositioned   Cognition   Arousal/Alertness Appropriate responses to stimuli   Following Commands Follows simple commands with repetition   Additional Comments Pt required increased encouragement to complete mobility.   Bed Mobility   Bed mobility Tested   Supine to Sit Minimum assist ;1 person assist;Verbal cues;Side rails up (#);Head of bed elevated   Sit to Supine Not tested   Additional comments Pt required increased VCs to complete for positioning of BLE. Then Probation officer provided hand held assist and assist at upper trunk to bring into upright position. Writer encouraged increased participation with cues for use of bed rail. Pt with increased pain during mobility requiring increased assist.   Transfers   Transfers Tested   Sit to Stand Moderate ;1 person assist;Verbal cues   Stand to sit Contact guard;1 person assist;Verbal cues    Transfer Assistive Device rolling walker   Additional comments Pt completed from edge of bed and hallway chair, with modAx1 at trunk to encourage forward weight shift and flexion. Pt required increased assist during transition of BUE from arm rest to RW, pt requried cues for approximation to hciar and use of arm rest during stand to sit.   Mobility   Mobility Tested   Gait Pattern Decreased cadence;Decreased R step length;Decreased R step height;Decreased L step length;Decreased L step height   Ambulation Assist Contact guard;1 person assist   Ambulation Distance (Feet) 28ft, 171ft   Ambulation Assistive Device rolling walker   Stairs Assistance Not tested   Additional comments Pt ambulated at slow gait speed with short bilateral step length but no overt loss of balance t/o. Writer provided steadying assist at trunk and VCs for increased hip/trunk extension and improved proximity to RW.   Therapeutic Exercises   Additional comments Pt completed x10 bilateral SLR, heel slides, and bridging with ER against resistance in supine.   Balance   Balance Tested   Sitting - Static Independent ;Unsupported   Standing - Static Contact guard;Supported   Standing - Dynamic Min assist;Supported   PT AM-PAC Mobility   Turning over in bed? 1   Sitting down on and standing up from a chair with arms? 1   Moving from lying on back to sitting on the side of the bed? 1   Moving to and from a bed to a chair? 2  Need to walk in hospital room? 3   Climbing 3 - 5 steps with a railing? 3   Total Raw Score 11   Standardized Score 33.86   CMS 1-100% Score 73   Assessment   Brief Assessment Remains appropriate for skilled therapy   Problem List Pain contributing to impairment;Impaired functional mobility   Patient / Family Goal rehab   Plan/Recommendation   Treatment Interventions Restorative PT   PT Frequency 2-4x/wk   Mobility Recommendations 1A with RW. Please assist with ambulation 3x/day   Referral Recommendations PM&R Consult;OT    Discharge Recommendations Acute Rehab   PT Discharge Equipment Recommended To be determined   Assessment/Recommendations Reviewed With: Patient;Nursing;Family   Next PT Visit BLE strengthening, progress all mobility   Time Calculation   PT Timed Codes 38   PT Untimed Codes 0   PT Unbilled Time 0   PT Total Treatment 38   Plan and Onset date   Plan of Care Date 06/18/17   Treatment Start Date 04/21/17     Amanda Galloway, PT, DPT  Pager [phone]

## 2017-06-18 NOTE — Progress Notes (Signed)
Assumed care of patient at 2300. Pt made PCU status, VS q2. VS WNL throughout night. Pt made strict NPO and placed on tele, NSR. Pain controlled with IV dilaudid. Pt currently comfortably in bed, call bell within reach. Will continue to monitor. Kathrynn Humble, RN

## 2017-06-18 NOTE — Progress Notes (Signed)
Assumed care of pt at 0700. Pt remains PCU status for Q2 vitals, VSS throughout shift. Pt strict NPO, oral meds held. Pt started on PCA for pain, states it is adequately controlling her pain at this time. G- tube intact to gravity drainage bag. IVF infusing per order. Currently resting comfortably in chair with call light within reach, will continue to monitor.   Ardeth Perfect, RN

## 2017-06-18 NOTE — Progress Notes (Signed)
Physical Therapy Treatment Note:       06/18/17 1200   Visit Details [facility])   Visit Type [facility]) Consult/No Charge   Reason for visit [facility]) General   Visit Number   Visit Number [facility]) / Treatment Day [facility]) (attempt)   Additional Comments   Additional comments Attempted to see pt for PT session. Upon arrival, MD just leaving pt's room. Pt tearful upon entry, reporting receiving news from MD. Requesting not to complete PT at this time, pt remained tearful. Will follow up at later time.     Rodena Piety, PT, DPT  Pager [phone]

## 2017-06-18 NOTE — Progress Notes (Addendum)
Surgical Oncology/Hepatobiliary Surgery Progress Note     LOS: 81 days     Subjective:  Interval Events:   CT abdomen yesterday evening with increased hepatic abscess and fluid collection behind stomach. Remains afebrile, VSS. WBC 14.5 (19). Holding LVX for drain placement today    Objective:  Vitals Sign Ranges for Past 24 Hours:  BP: (94-124)/(57-80)   Temp:  [35.7 C (96.3 F)-37.2 C (99 F)]   Temp src: Temporal (04/16 0748)  Heart Rate:  [88-97]   Resp:  [16-18]   SpO2:  [93 %-97 %]     Physical Exam:  General Appearance: Resting in bed, in NAD  Cardiac: regular rate  Respiratory: non-labored breathing on room air  Abdomen: Soft, non distended, G tube site c/d/i, tender surrounding tube  Extremities: warm, erythema of the bilateral anterior calves with small amount of skin breakdown, minimal edema no posterior calf tenderness     Labs:    CBC:    Recent Labs  Lab 06/18/17  0102 06/16/17  2332 06/16/17  0419 06/16/17  0218 06/15/17  0206 06/14/17  1805 06/14/17  0033  06/12/17  2331   WBC 14.5* 19.0*  --  20.1* 14.6*  --  10.8*  --  11.6*   Hemoglobin 7.0* 6.8* 7.0* 7.0* 8.0*  --  7.4*  < > 7.0*   Hematocrit 23* 21* 22* 22* 26*  --  24*  --  23*   Platelets 558* 523*  --  569* 645* 647* 585*  < > 561*   < > = values in this interval not displayed.    Metabolic Panel:    Recent Labs  Lab 06/18/17  0102 06/16/17  2332 06/16/17  0218 06/15/17  0206 06/14/17  0033 06/12/17  2331   Sodium 129* 129* 130* 132* 132* 132*   Potassium 4.4 4.3 3.7 5.0 5.0 5.1   Chloride 92* 94* 95* 95* 95* 97   CO2 '23 22 23 21 23 23   ' UN '10 9 7 8 10 11   ' Creatinine 0.44* 0.46* 0.51 0.54 0.53 0.53   Glucose 91 88 159* 101* 103* 86   Calcium 8.1* 8.0* 7.8* 8.2* 8.3* 8.4*   Magnesium  --  1.5*  --   --   --  1.6   Phosphorus  --  3.4  --   --   --  5.1*          Assessment:  72 y.o. female with h/o recent PE and pancreatic cancer POD # 81  status post whipple procedure complicated by delayed gastric emptying.  NGT replaced on 04/04/17. UGI on  04/12/17 concerning for obstruction just beyond the [place] in the efferent limb.  Course complicated on 08/26/74 by rising LFTs and sepsis with CT concerning for cholangitis given biliary tree obstruction and PV compression due to hematoma and afferent limb distention in setting of PE treatment. Is s/p IR PTC on 04/16/17 and IR percutaneous aspiration of anterior abdominal fluid collection on 04/24/17. IR LUQ perc drain placement 3/1. IR anterior abdominal 35f perc drain placement 3/3. IR anterior perc drain into left hepatic collection 3/6.    Plan:  - NPO, IVF  - Continue IV antibiotic regimen (Ertapenem and Daptomycin) per ID Recommendations.  - Will discuss IR perc drain in liver today  - Hyponatremia, continue salt tabs x3 days  - Anemia; stable.  - Aggressive bowel regimen  - SCDs, holding therapeutic lovenox.  - Dispo: Pending clinical course, resolution of acute infection.  Pt has Physical Debilitation requiring a rehab setting. PT/OT recommending SNF.    Cherylann Banas, MD  06/18/2017     8:33 AM   General Surgery Resident      HPB-GI Attending Addendum:   I personally examined the patient, reviewed the notes, and discussed the plan of care with the residents and patient. Agree with detailed resident's note. Please see the note above for details of history, exam, labs, assessment/plan which reflect my input.      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      Gastric tube complicated by bleeding and possible disruption of gastrojejunostomy. New fluid collection. Hold tube feedings. IR evaluation felt attempt at percutaneous aspiration could be possible.   Increase in size of hepatic collection. Will require IR drainage.      Bacteremia with VRE. Mediport removed. IV antibiotics.    New cultures negative. PICC line in place. Continue current antibiotics.   Hyperglycemia with insulin sliding scale and recurrent  need for insulin. continue   Post operative anemia. Stable. No need for transfusion  Pulmonary embolism. Present on admission. Heparin drip once stable for treatment. Holding for procedures.   Hypoalbuminemia. Moderate malnutrition. Will need TPN for short term.  Depression. Ritalin helpful. Daily encouragement and reinforcement of goals.   Constipation . Aggressive regimen.   Physical debility. Requires rehabilitation setting.         Delano Metz, MD, FACS  Hepatobiliary, Pancreas & GI Surgery

## 2017-06-18 NOTE — Progress Notes (Signed)
Brief Infectious Disease Progress Note    Patient seen yesterday, refer to ID note for detail.    Results of yesterday's CT of abdomen and pelvis showing enlarging and new fluid collections c/w abscesses reviewed with ID attending, Dr. Marjory Lies.      Patient is currently on IV Daptomycin 13m/kg daily, IV Ertapenem 1 gram daily, and IV fluconazole 800 mg daily.  She was briefly changed from Ertapenem to oral Bactrim DS, then swtiched back to Ertapenem d/t NPO status.  We recommend she now remain on the IV Ertapenem. Also suggest increasing Daptomycin to 164mkg (round to 800 mg daily) to optimize dosing, though it is unlikely antibiotic adjustments alone will significantly impact intraabdominal and hepatic abscesses without source control, ie, fluid collection drainage.  Discussed with covering provider, BeMerla RichesP. Plan is for drain(s) placement after UGI and further assessment by Dr. GaRon Agee    Recommendations:   Drainage of largest/accessible intraabominal and hepatic fluid collections, please send for GS and culture.    Increase Daptomycin to 1051mg daily (round to 800 mg).     Continue IV Ertapenem 1 gram daily and IV or PO fluconazole 800 mg daily.   Follow weekly CK while on Daptomycin   Follow CBC with diff daily for now, reduce frequency once stabilizes   Follow BMP twice weekly, or more often as indicated.     Follow liver chemistries, CRP, ESR weekly.     Thank you for allowing us [name] participate in the care of this patient  Please call with questions/concerns.     ValLonzo CandyNP-BC  Infectious Diseases, Team 3  Pager [phone] Ph [phone]

## 2017-06-18 NOTE — Progress Notes (Signed)
Palliative Care Progress Note    HPI: [name] w/ PMH fibromyalgia, pancreatic CA s/p Whipple for 'cure' 03/29/17; s/p ICU w/ intub for resp failure & septic shock, weaned from pressors and extubated to HFNC 2/17 w/ IR drainage bili fluid collection 2/19, s/p IV abx, now all abd drains for abscesses removed. Palliative following for symptoms, started methadone 2/19, weaned dose 2/23 d/t concern for sedation. Pt w/ ongoing poor po intake, now tolerating bolus fds via dubhoff fdg tube. Unfortunately not able to get PEG w/o going to OR (anticipate 4/11), s/p R pleural pigtail for ~ 1 L drainage, now removed and + VRE from mediport, now removed 4/8. S/p PICC & PEG in IR on 4/12 w/ FU CT abd and pelvis on 4/15 showing enlarging and new fluid collections c/w abscesses. Plan for perc drain 4/17 & bowel rest. Goal dispo SNF Rehab.    Subjective: "I just wonder how much my body can take. Ok, I'll keep going, I just want everyone to stop and listen when I tell them I'm in pain and I need more time. I want better pain control."    Objective: chart reviewed, pt case discussed w/ Surg Onc, my PC team attendings and covering provider; visited w/ pt and dtr [name] this afternoon and Dr Ron Agee relayed that as disappointing the set back is w/ CT findings showing likely perf and enlarged abscess needing drainage, that she does not feel this is something pt cannot recover from; writer and Dr Ron Agee reiterated that we would be truthful w/ pt and would not offer treatments if we did not have hope that it was for her benefit and part of plan to eventually recover back to independent living; writer advised since pt NPO would switch methadone to IV and start dilaudid PCA which would give pt more autonomy and independence in self dosing; pt anxious that PCA would not be enough if she awoke in pain Water quality scientist rec'd having RN delivered dilaudid PRN additional dose available as back up for breakthrough pain not controlled by PCA and reassured that  dose PCA could be adjusted up if needed as safe guard that pt would not be able to hit button if too sleepy from dilaudid; pt denies N/V, no stool since 4/13 so agreeable to bisacodyl supp today, reiterated goal to prevent opioid induced constipation (has been issue for pt in past & she needed methylnaltrexone); pt walked w/ walker & 1 assist w/ walker today    Palliative Care ROS:  Pain mod tenderness around new PEG site, rates 4/10; mild peripheral neuropathy in feet  Nausea no  Dyspnea no  Weakness yes  Constipation yes  Delirum no  Last stool 4/13    Physical Examination:   BP: (94-124)/(57-80)   Temp:  [35.7 C (96.3 F)-36.6 C (97.9 F)]   Temp src: Temporal (04/16 1830)  Heart Rate:  [88-100]   Resp:  [16-20]   SpO2:  [93 %-98 %]   Weight:  [76.9 kg (169 lb 8 oz)]   Gen appearance: [name], sitting up in recliner, feeling tired & intermittently tearful though endorsed agreement to continue w/ treatment plan w/ anticipated perc drain tomorrow, reports mod pain around new PEG tube, better since increased dilaudid dose yesterday  Lungs: easy resp, dimin bases, RA  Heart: reg  Abd: large, soft, tender around PEG site & tube now draining dark brown liquid, +BS, voids on own up to BR or commode  Extrem: mod LE edema, reddened skin  LEs  Skin: warm, dry  Neuro:  A+O x 3; feeling very weak and not wanting to get OOB today; at baseline ambulates short distances w/ walker & standby 1 assist     Assessment/Plan: [name] w/ pancreatic CA, s/p Whipple in Jan. s/p TPN, s/p perc drains and IV abx w/ resolving abd abscesses, s/p pigtail L pleural effusion, s/p mediport removal d/t VRE bacteremia & PICC placement. S/p PEG placement in IR on 4/12 w/ increased pain at site & new abd CT findings of enlarged abscess, concern for SBO warranting bowel rest. Pt planned perc drain tomorrow, will switch methadone to IV ATC and start dilaudid PCA for better symptom management & more pt control of pain  meds.    Pain/dyspnea  Acetaminophen 1 gm po TID -discontinue, NPO status  Switch to IV Methadone 2.5 mg IV TID (hold for sedation, ie not arousable to sternal rub) iv methadone reduced 50% from po dose  Start dilaudid PCA 0.25 mg IV Q 20 min prn (equiv 0.75 mg/hr)  Dilaudid 0.75 mg IV Q 2 hrs prn (x 7 on 4/16) -change to 1 mg IV Q 3 hrs prn RN delivered for break through pain not relieved by PCA  Lidocaine patch daily x 2 (abd, LUE)  Encourage incentive spirometry prn     Anxiety/agitation/nausea/fatigue  Duloxetine 20 mg po daily --d/c now NPO on bowel rest, add low dose ativan 0.25 mg IV Q 4 hrs prn for anxiety   S/p Reglan 10 mg po 4x/d (AC & QHS) -now discontinued 4/14, discussed w/ Dr Ron Agee, plan to hold off restarting for now  Pantoprazole 40 mg po BID -now discontinued 4/14, consider restarting IV if issues w/ GERD   Ondansetron 4 mg IV Q 6 hrs prn (x 1 on 4/15)  Ritalin 2.5 mg po at 8 am and 2.5 mg at 12 pm (reduced dose 3/26 taking for fatigue & mood)-d/c now NPO on bowel rest, no iv equivalent   Promethazine 12.5 mg IV Q 6 hrs prn (x 1 on 4/15)  S/p introduction of aromatherapy personal inhaler "Haidy's blend" & "Stop, Think, Breathe" phone app for relaxation    Prevention of constipation, last stool 4/13  Colace, Miralax & Senna po-d/c now NPO on bowel rest  Bisacodyl supp pr daily (hasn't been getting) -pt agreeable to taking, goal to avoid opioid induced constipation, has needed methylnaltrexone in past    Dry flaky edematous Skin  Bacitracin oint BID to BLEs  Eucerin prn for BLEs    GOC/prognosis  Full code  Dtr Rod Mae is HCP  Dispo plans still eventually for SNF Rehab and may likely need LTC for a period in addition     GOC discussed w/ pt & dtr 4/16 w/ Dr Ron Agee in light of recent CT findings of worsening abscess and concern for sm bowel perf s/p PEG needing bowel rest and perc drain; pt was reassured that treatment goals remain for eventual recovery to independent living & reiterated that  we can do better w/ pain control & PCA started; pt acknowledged that she has a choice in deciding to continue & endorsed feeling tired and worried though in agreement w/ continuing full treatment    Pt case discussed w/ Surg Onc resident and Dr Ron Agee, my PC colleagues including Dr Lynelle Smoke and Dr Brion Aliment, bedside RN and Onc covering provider.    Thank you for allowing [name] to participate in pt's care. We will continue to follow along, please call if questions/concerns.  Total Time Spent 50 minutes: >50% of time was spent in counseling and/or coordination of care.     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    Hypothyroidism E03.9    Pancreatic cancer C25.9    Anemia D64.9     hx of Pulmonary embolism I26.99    Depression F32.9    Sepsis A41.9       No Known Allergies (drug, envir, food or latex)    Scheduled Meds:    daptomycin IV  10 mg/kg Intravenous Q24H    methadone IV  2.5 mg Intravenous TID    sodium chloride  1 g Oral TID WC    ferrous sulfate  325 mg Oral Daily with breakfast    lactobacillus rhamnosus (GG)  1 capsule Oral Daily    fluconazole (DIFLUCAN) IV  800 mg Intravenous Q24H    ertapenem  1,000 mg Intravenous Q24H    methylphenidate  2.5 mg Oral BID    polyethylene glycol  17 g Per G Tube Daily    lidocaine  1 patch Transdermal Q24H    triamterene-hydrochlorothiazide  1 tablet Oral QAM    lidocaine  1 patch Transdermal Q24H    senna  2 tablet Oral 2 times per day    bisacodyl  10 mg Rectal Daily    DULoxetine  20 mg Oral Daily    docusate sodium  200 mg Oral 2 times per day    acetaminophen  1,000 mg Oral Q8H    levothyroxine  137 mcg Oral Daily       Continuous Infusions:    lactated ringers 100 mL/hr (06/18/17  1844)    HYDROmorphone      sodium chloride         PRN Meds:  naloxone, HYDROmorphone PF, HYDROmorphone, mineral oil-hydrophilic petrolatum, promethazine, sodium chloride, sodium chloride, sodium chloride, dextrose, ibuprofen, sodium chloride, dextrose, calcium carbonate, heparin lock flush, ondansetron

## 2017-06-18 NOTE — Plan of Care (Signed)
Problem: Safety  Goal: Patient will remain free of falls  Outcome: Maintaining      Problem: Pain/Comfort  Goal: Patient's pain or discomfort is manageable  Outcome: Maintaining      Problem: Mobility  Goal: Functional status is maintained or improved - Geriatric  Outcome: Maintaining      Problem: Nutrition  Goal: Patient's nutritional status is maintained or improved  Outcome: Goal not met  Pt NPO    Problem: Bowel Elimination  Goal: Elimination patterns are normal or improving  Outcome: Maintaining      Problem: Post-Operative Hemodynamic Stability  Goal: Maintain Hemodynamic Stability  Outcome: Maintaining      Problem: Post-Operative Bowel Elimination  Goal: Elimination pattern is normal or improving  Outcome: Maintaining      Problem: GI Bleeding Elimination  Goal: Elimination of patterns are normal or improving  Outcome: Maintaining

## 2017-06-18 NOTE — Progress Notes (Signed)
Report Given   Diagnostic Imaging Nurse Handoff:    Study:  Upper Gi with small bowel follow thru  A&O:  x3  Telemetry:  yes  Precautions:  none  Transport:  bed w/hover    NPO:  yes  Tube Feeds:  No    Report Received From:  Yves Dill, RN

## 2017-06-19 ENCOUNTER — Inpatient Hospital Stay: Payer: Medicare (Managed Care)

## 2017-06-19 LAB — BASIC METABOLIC PANEL
Anion Gap: 12 (ref 7–16)
CO2: 25 mmol/L (ref 20–28)
Calcium: 8.5 mg/dL — ABNORMAL LOW (ref 8.6–10.2)
Chloride: 91 mmol/L — ABNORMAL LOW (ref 96–108)
Creatinine: 0.5 mg/dL — ABNORMAL LOW (ref 0.51–0.95)
GFR,Black: 113 *
GFR,Caucasian: 98 *
Glucose: 85 mg/dL (ref 60–99)
Lab: 10 mg/dL (ref 6–20)
Potassium: 4.6 mmol/L (ref 3.3–5.1)
Sodium: 128 mmol/L — ABNORMAL LOW (ref 133–145)

## 2017-06-19 LAB — CBC AND DIFFERENTIAL
Baso # K/uL: 0.1 10*3/uL (ref 0.0–0.1)
Basophil %: 0.6 %
Eos # K/uL: 0.4 10*3/uL (ref 0.0–0.4)
Eosinophil %: 3.2 %
Hematocrit: 22 % — ABNORMAL LOW (ref 34–45)
Hemoglobin: 6.9 g/dL — ABNORMAL LOW (ref 11.2–15.7)
IMM Granulocytes #: 0.1 10*3/uL
IMM Granulocytes: 0.7 %
Lymph # K/uL: 1.2 10*3/uL (ref 1.2–3.7)
Lymphocyte %: 11.1 %
MCH: 28 pg/cell (ref 26–32)
MCHC: 32 g/dL (ref 32–36)
MCV: 89 fL (ref 79–95)
Mono # K/uL: 1 10*3/uL — ABNORMAL HIGH (ref 0.2–0.9)
Monocyte %: 9.4 %
Neut # K/uL: 8.3 10*3/uL — ABNORMAL HIGH (ref 1.6–6.1)
Nucl RBC # K/uL: 0 10*3/uL (ref 0.0–0.0)
Nucl RBC %: 0 /100 WBC (ref 0.0–0.2)
Platelets: 593 10*3/uL — ABNORMAL HIGH (ref 160–370)
RBC: 2.4 MIL/uL — ABNORMAL LOW (ref 3.9–5.2)
RDW: 16.5 % — ABNORMAL HIGH (ref 11.7–14.4)
Seg Neut %: 75 %
WBC: 11.1 10*3/uL — ABNORMAL HIGH (ref 4.0–10.0)

## 2017-06-19 LAB — CK: CK: 29 U/L (ref 26–192)

## 2017-06-19 LAB — GRAM STAIN: Gram Stain: 0

## 2017-06-19 MED ORDER — HEPARIN SODIUM 5000 UNIT/ML SQ *I*
5000.0000 [IU] | Freq: Three times a day (TID) | SUBCUTANEOUS | Status: DC
Start: 2017-06-19 — End: 2017-07-04
  Administered 2017-06-19 – 2017-07-04 (×44): 5000 [IU] via SUBCUTANEOUS
  Filled 2017-06-19 (×46): qty 1

## 2017-06-19 MED ORDER — MIDAZOLAM HCL 1 MG/ML IJ SOLN *I* WRAPPED
INTRAMUSCULAR | Status: AC
Start: 2017-06-19 — End: 2017-06-19
  Filled 2017-06-19: qty 2

## 2017-06-19 MED ORDER — LORAZEPAM 2 MG/ML IJ SOLN *I*
0.2500 mg | INTRAMUSCULAR | Status: DC | PRN
Start: 2017-06-19 — End: 2017-06-21
  Administered 2017-06-19 – 2017-06-21 (×4): 0.25 mg via INTRAVENOUS
  Filled 2017-06-19 (×4): qty 1

## 2017-06-19 MED ORDER — FENTANYL CITRATE 50 MCG/ML IJ SOLN *WRAPPED*
INTRAMUSCULAR | Status: AC
Start: 2017-06-19 — End: 2017-06-19
  Filled 2017-06-19: qty 2

## 2017-06-19 MED ORDER — FENTANYL CITRATE 50 MCG/ML IJ SOLN *WRAPPED*
INTRAMUSCULAR | Status: AC | PRN
Start: 2017-06-19 — End: 2017-06-19
  Administered 2017-06-19 (×2): 50 ug via INTRAVENOUS

## 2017-06-19 MED ORDER — DEXTROSE 5% AND 0.9% NACL IV SOLN *I*
100.0000 mL/h | INTRAVENOUS | Status: AC
Start: 2017-06-19 — End: 2017-06-21
  Administered 2017-06-19 (×10): 100 mL/h
  Administered 2017-06-19: 100 mL/h via INTRAVENOUS
  Administered 2017-06-19 (×9): 100 mL/h
  Administered 2017-06-19: 100 mL/h via INTRAVENOUS
  Administered 2017-06-19 – 2017-06-20 (×5): 100 mL/h
  Administered 2017-06-20: 100 mL/h via INTRAVENOUS
  Administered 2017-06-20 (×14): 100 mL/h
  Administered 2017-06-20 – 2017-06-21 (×4): 100 mL/h via INTRAVENOUS

## 2017-06-19 MED ORDER — LIDOCAINE HCL 1 % IJ SOLN *I*
INTRAMUSCULAR | Status: AC
Start: 2017-06-19 — End: 2017-06-19
  Filled 2017-06-19: qty 20

## 2017-06-19 MED ORDER — MIDAZOLAM HCL 1 MG/ML IJ SOLN *I* WRAPPED
INTRAMUSCULAR | Status: AC | PRN
Start: 2017-06-19 — End: 2017-06-19
  Administered 2017-06-19 (×2): 1 mg via INTRAVENOUS

## 2017-06-19 NOTE — Progress Notes (Addendum)
Surgical Oncology/Hepatobiliary Surgery Progress Note     LOS: 82 days     Subjective:  Interval Events:   No acute events overnight.  Continues to complain of intense pain surrounding g tube site  WBC improved 11.1 (14.5)    Objective:  Vitals Sign Ranges for Past 24 Hours:  BP: (94-115)/(53-76)   Temp:  [35.9 C (96.6 F)-36.5 C (97.7 F)]   Temp src: Temporal (04/17 0741)  Heart Rate:  [88-100]   Resp:  [14-20]   SpO2:  [93 %-98 %]   Weight:  [76.9 kg (169 lb 8 oz)]     Physical Exam:  General Appearance: Resting in bed, in NAD  Cardiac: regular rate  Respiratory: non-labored breathing on room air  Abdomen: Soft, non distended, G tube site c/d/i, tender surrounding tube. G to gravity with brownish output  Extremities: warm, erythema of the bilateral anterior calves with small amount of skin breakdown, minimal edema no posterior calf tenderness     Labs:    CBC:    Recent Labs  Lab 06/19/17  0020 06/18/17  0102 06/16/17  2332 06/16/17  0419 06/16/17  0218 06/15/17  0206 06/14/17  1805 06/14/17  0033   WBC 11.1* 14.5* 19.0*  --  20.1* 14.6*  --  10.8*   Hemoglobin 6.9* 7.0* 6.8* 7.0* 7.0* 8.0*  --  7.4*   Hematocrit 22* 23* 21* 22* 22* 26*  --  24*   Platelets 593* 558* 523*  --  569* 645* 647* 403*       Metabolic Panel:    Recent Labs  Lab 06/19/17  0020 06/18/17  0102 06/16/17  2332 06/16/17  0218 06/15/17  0206 06/14/17  0033 06/12/17  2331   Sodium 128* 129* 129* 130* 132* 132* 132*   Potassium 4.6 4.4 4.3 3.7 5.0 5.0 5.1   Chloride 91* 92* 94* 95* 95* 95* 97   CO2 '25 23 22 23 21 23 23   ' UN '10 10 9 7 8 10 11   ' Creatinine 0.50* 0.44* 0.46* 0.51 0.54 0.53 0.53   Glucose 85 91 88 159* 101* 103* 86   Calcium 8.5* 8.1* 8.0* 7.8* 8.2* 8.3* 8.4*   Magnesium  --   --  1.5*  --   --   --  1.6   Phosphorus  --   --  3.4  --   --   --  5.1*          Assessment:  72 y.o. female with h/o recent PE and pancreatic cancer POD # 82  status post whipple procedure complicated by delayed gastric emptying.  NGT replaced on  04/04/17. UGI on 04/12/17 concerning for obstruction just beyond the [place] in the efferent limb.  Course complicated on 4/74/25 by rising LFTs and sepsis with CT concerning for cholangitis given biliary tree obstruction and PV compression due to hematoma and afferent limb distention in setting of PE treatment. Is s/p IR PTC on 04/16/17 and IR percutaneous aspiration of anterior abdominal fluid collection on 04/24/17. IR LUQ perc drain placement 3/1. IR anterior abdominal 59f perc drain placement 3/3. IR anterior perc drain into left hepatic collection 3/6.    Plan:  - NPO, IVF  - Pain control with PCA  - Continue IV antibiotic regimen per ID Recommendations.  - KUB today and if contrast cleared will plan for UGI  - Possible IR perc drain in liver today  - Anemia; stable.  - Aggressive bowel regimen  - SCDs,  holding therapeutic lovenox.  - Dispo: Pending clinical course, resolution of acute infection. Pt has Physical Debilitation requiring a rehab setting. PT/OT recommending SNF.    Cherylann Banas, MD  06/19/2017     8:37 AM   General Surgery Resident         HPB-GI Attending Addendum:   I personally examined the patient, reviewed the notes, and discussed the plan of care with the residents and patient. Agree with detailed resident's note. Please see the note above for details of history, exam, labs, assessment/plan which reflect my input.      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      Gastric tube complicated by bleeding and possible disruption of gastrojejunostomy. New fluid collection. Hold tube feedings.   Increase in size of hepatic collection. Will require IR drainage.     Bacteremia with VRE. Mediport removed. IV antibiotics.    new cultures negative. PICC line in place.   Hyperglycemia with insulin sliding scale and recurrent need for insulin. continue   Post operative anemia. Stable. No need for  transfusion  Pulmonary embolism. Present on admission. Heparin drip.  Hypoalbuminemia. Moderate malnutrition. Will need TPN for short term.  Depression. Encouragement provided. Slightly improved. Ritalin helpful.   Constipation . Aggressive regimen.   Physical debility. Requires rehabilitation setting.          Delano Metz, MD, FACS  Hepatobiliary, Pancreas & GI Surgery

## 2017-06-19 NOTE — H&P (View-Only) (Signed)
OUTPATIENT  Chief Complaint: G tube placement     History of Present Illness:  [name] w/ PMH fibromyalgia, pancreatic CA s/p Whipple for 'cure' 03/29/17; s/p ICU w/ intub for resp failure & septic shock, weaned from pressors and extubated to HFNC 2/17 w/ IR drainage bili fluid collection 2/19, s/p IV abx, now all abd drains for abscesses removed. Palliative following for symptoms, started methadone 2/19, weaned dose 2/23 d/t concern for sedation. Pt w/ ongoing poor po intake, now tolerating bolus fds via dubhoff fdg tube.      HPI    Past Medical History:   Diagnosis Date    Acute kidney failure ([place]) 03/31/2017    Cancer     Depression     Fibromyalgia     Hypothyroidism      Past Surgical History:   Procedure Laterality Date    CHOLECYSTECTOMY      CHOLECYSTECTOMY, LAPAROSCOPIC  09/06/2016    HYSTERECTOMY  08/1986    Fibroids    KNEE SURGERY Right     PR INSERT TUNNELED CV CATH WITH PORT Right 10/04/2016    Procedure: Right IJ MEDIPORT Insertion;  Surgeon: Delano Metz, MD;  Location: [facility] MAIN OR;  Service: Oncology General    PR LAP,DIAGNOSTIC ABDOMEN N/A 10/04/2016    Procedure: LAPAROSCOPY DIAGNOSTIC;  Surgeon: Delano Metz, MD;  Location: [facility] MAIN OR;  Service: Oncology General    PR PART [place] PANC,PROX+REMV DUOD+ANAST N/A 03/29/2017    Procedure: WHIPPLE PROCEDURE;  Surgeon: Delano Metz, MD;  Location: [facility] MAIN OR;  Service: Oncology General    ROTATOR CUFF REPAIR Right     TONSILLECTOMY AND ADENOIDECTOMY       Family History   Problem Relation Age of Onset    Breast cancer Mother         died 72    Cancer Father         NHL, died 66    Heart Disease Father     Diabetes Sister     Obesity Sister         4 siblings     Social History     Social History    Marital status: Divorced     Spouse name: N/A    Number of children: N/A    Years of education: N/A     Social History Main Topics    Smoking status: Never Smoker    Smokeless tobacco: Never Used    Alcohol use No    Drug use: No    Sexual  activity: Not Asked     Other Topics Concern    None     Social History Narrative    None       Allergies: No Known Allergies (drug, envir, food or latex)    Current Facility-Administered Medications   Medication Dose Route Frequency    sulfamethoxazole-trimethoprim (BACTRIM DS,SEPTRA DS) 800-160 MG per tablet 1 tablet  1 tablet Oral 2 times per day    [START ON 06/15/2017] ferrous sulfate tablet 325 mg  325 mg Oral Daily with breakfast    lactobacillus rhamnosus (GG) (CULTURELLE) capsule 1 each  1 capsule Oral Daily    methadone (DOLOPHINE) tablet 5 mg  5 mg Oral 3 times per day    mineral oil-hydrophilic petrolatum (AQUAPHOR) ointment   Topical PRN    sodium chloride 0.9 % flush 10 mL  10 mL Intracatheter Q8H PRN    sodium chloride 0.9 % flush 10 mL  10 mL  Intracatheter PRN    DAPTOmycin (CUBICIN) 600 mg in sodium chloride 0.9% 70 mL IVPB  8 mg/kg Intravenous Q24H    methylphenidate (RITALIN) tablet 2.5 mg  2.5 mg Oral BID    sodium chloride 0.9 % FLUSH REQUIRED IF PATIENT HAS IV  0-500 mL/hr Intravenous PRN    dextrose 5 % FLUSH REQUIRED IF PATIENT HAS IV  0-500 mL/hr Intravenous PRN    sodium chloride 0.9% IV  3 mL/hr Intravenous Continuous    polyethylene glycol (GLYCOLAX,MIRALAX) powder 17 g  17 g Per G Tube Daily    sodium chloride 0.9% IV  100 mL/hr Intravenous Continuous    ibuprofen (ADVIL,MOTRIN) tablet 600 mg  600 mg Oral Q6H PRN    insulin glargine (LANTUS) injection 4 Units  4 Units Subcutaneous Nightly    lidocaine (LIDODERM) 5 % patch 1 patch  1 patch Transdermal Q24H    triamterene-hydrochlorothiazide (MAXZIDE-25) 37.5-25 MG per tablet 1 tablet  1 tablet Oral QAM    lidocaine (LIDODERM) 5 % patch 1 patch  1 patch Transdermal Q24H    HYDROmorphone (DILAUDID) tablet 2 mg  2 mg Oral Q4H PRN    naloxone (NARCAN) 0.4 mg/mL injection 0.1 mg  0.1 mg Intravenous Q5 Min PRN    sodium chloride 0.9 % FLUSH REQUIRED IF PATIENT HAS IV  0-500 mL/hr Intravenous PRN    dextrose 5 % FLUSH  REQUIRED IF PATIENT HAS IV  0-500 mL/hr Intravenous PRN    fluconazole (DIFLUCAN) tablet 800 mg  800 mg Oral Daily    pantoprazole (PROTONIX) EC tablet 40 mg  40 mg Oral BID AC    calcium carbonate (TUMS) chewable tablet 1,000 mg  1,000 mg Oral BID PRN    senna (SENOKOT) tablet 2 tablet  2 tablet Oral 2 times per day    bisacodyl (DULCOLAX) suppository 10 mg  10 mg Rectal Daily    DULoxetine (CYMBALTA) DR capsule 20 mg  20 mg Oral Daily    metoclopramide (REGLAN) tablet 10 mg  10 mg Oral 4x Daily AC & HS    docusate sodium (COLACE) capsule 200 mg  200 mg Oral 2 times per day    acetaminophen (TYLENOL) tablet 1,000 mg  1,000 mg Oral Q8H    heparin lock flush injection 50 Units  50 Units Intracatheter PRN    ondansetron (ZOFRAN) injection 4 mg  4 mg Intravenous Q6H PRN    levothyroxine (SYNTHROID, LEVOTHROID) tablet 137 mcg  137 mcg Oral Daily     Facility-Administered Medications Ordered in Other Encounters   Medication Dose Route Frequency    etomidate (AMIDATE) 2 mg/mL injection    PRN    rocuronium (ZEMURON) 10 mg/mL injection   Intravenous PRN        Review of Systems: '  NAD  NO abdominal pain  No shortness of brath       ROS    Last Nursing documented pain:  0-10 Scale: 0 (06/14/17 1211)      Patient Vitals for the past 24 hrs:   BP Temp Temp src Pulse Resp SpO2   06/14/17 1500 102/62 - - 94 21 92 %   06/14/17 1211 110/60 36 C (96.8 F) TEMPORAL 94 16 98 %   06/14/17 0936 108/60 36.1 C (97 F) TEMPORAL 98 16 96 %   06/14/17 0330 124/80 36.1 C (97 F) TEMPORAL 91 16 97 %   06/14/17 0100 - - - - 14 -   06/13/17 2344 120/80 36.4 C (97.5  F) TEMPORAL 96 16 97 %   06/13/17 2139 - - - - 16 -   06/13/17 2022 108/74 36.4 C (97.5 F) TEMPORAL 94 16 97 %   06/13/17 1620 102/68 36.2 C (97.2 F) TEMPORAL 94 16 98 %          NAD, AAOX3  CTABL  RRR  SF,NT,ND    Physical Exam    Lab Results:   All labs in the last 72 hours:  Recent Results (from the past 72 hour(s))   APTT    Collection Time: 06/11/17   4:26 PM   Result Value Ref Range    aPTT CANCELED 25.8 - 37.9 sec   APTT    Collection Time: 06/11/17  6:43 PM   Result Value Ref Range    aPTT 36.4 25.8 - 37.9 sec   POCT glucose    Collection Time: 06/11/17 10:10 PM   Result Value Ref Range    Glucose POCT 106 (H) 60 - 99 mg/dL   CK    Collection Time: 06/12/17  3:45 AM   Result Value Ref Range    CK 47 26 - 192 U/L   CBC and differential    Collection Time: 06/12/17  3:45 AM   Result Value Ref Range    WBC 10.4 (H) 4.0 - 10.0 THOU/uL    RBC 2.5 (L) 3.9 - 5.2 MIL/uL    Hemoglobin 7.1 (L) 11.2 - 15.7 g/dL    Hematocrit 22 (L) 34 - 45 %    MCV 89 79 - 95 fL    MCH 28 26 - 32 pg/cell    MCHC 32 32 - 36 g/dL    RDW 16.8 (H) 11.7 - 14.4 %    Platelets 540 (H) 160 - 370 THOU/uL    Seg Neut % 60.7 %    Lymphocyte % 24.1 %    Monocyte % 8.0 %    Eosinophil % 5.4 %    Basophil % 0.0 %    Neut # K/uL 6.3 (H) 1.6 - 6.1 THOU/uL    Lymph # K/uL 2.7 1.2 - 3.7 THOU/uL    Mono # K/uL 0.8 0.2 - 0.9 THOU/uL    Eos # K/uL 0.6 (H) 0.0 - 0.4 THOU/uL    Baso # K/uL 0.0 0.0 - 0.1 THOU/uL    Nucl RBC % 0.0 0.0 - 0.2 /100 WBC    Nucl RBC # K/uL 0.0 0.0 - 0.0 THOU/uL   Basic metabolic panel    Collection Time: 06/12/17  3:45 AM   Result Value Ref Range    Glucose 111 (H) 60 - 99 mg/dL    Sodium 133 133 - 145 mmol/L    Potassium 4.4 3.3 - 5.1 mmol/L    Chloride 96 96 - 108 mmol/L    CO2 24 20 - 28 mmol/L    Anion Gap 13 7 - 16    UN 12 6 - 20 mg/dL    Creatinine 0.54 0.51 - 0.95 mg/dL    GFR,Caucasian 95 *    GFR,Black 110 *    Calcium 8.3 (L) 8.6 - 10.2 mg/dL   Protime-INR    Collection Time: 06/12/17  3:45 AM   Result Value Ref Range    Protime CANCELED 10.0 - 12.9 sec    INR CANCELED 0.9 - 1.1   APTT    Collection Time: 06/12/17  3:45 AM   Result Value Ref Range    aPTT CANCELED 25.8 - 37.9 sec  Diff manual    Collection Time: 06/12/17  3:45 AM   Result Value Ref Range    React Lymph % 2 0 - 6 %    Giant PLTs Present     Manual DIFF RESULTS     Diff Based On 112 CELLS   APTT     Collection Time: 06/12/17  5:22 AM   Result Value Ref Range    aPTT 31.2 25.8 - 37.9 sec   Platelet count    Collection Time: 06/12/17  9:23 AM   Result Value Ref Range    Platelets 537 (H) 160 - 370 THOU/uL   Hold SST    Collection Time: 06/12/17  9:23 AM   Result Value Ref Range    Hold SST HOLD TUBE    APTT    Collection Time: 06/12/17  3:56 PM   Result Value Ref Range    aPTT 31.4 25.8 - 37.9 sec   POCT glucose    Collection Time: 06/12/17  9:34 PM   Result Value Ref Range    Glucose POCT 93 60 - 99 mg/dL   C reactive protein    Collection Time: 06/12/17 11:31 PM   Result Value Ref Range    CRP 170 (H) 0 - 10 mg/L   CBC and differential    Collection Time: 06/12/17 11:31 PM   Result Value Ref Range    Platelets 561 (H) 160 - 370 THOU/uL    WBC 11.6 (H) 4.0 - 10.0 THOU/uL    RBC 2.6 (L) 3.9 - 5.2 MIL/uL    Hemoglobin 7.0 (L) 11.2 - 15.7 g/dL    Hematocrit 23 (L) 34 - 45 %    MCV 88 79 - 95 fL    MCH 27 26 - 32 pg/cell    MCHC 31 (L) 32 - 36 g/dL    RDW 16.8 (H) 11.7 - 14.4 %    Seg Neut % 66.6 %    Lymphocyte % 19.3 %    Monocyte % 4.4 %    Eosinophil % 7.9 %    Basophil % 0.9 %    Neut # K/uL 7.8 (H) 1.6 - 6.1 THOU/uL    Lymph # K/uL 2.2 1.2 - 3.7 THOU/uL    Mono # K/uL 0.5 0.2 - 0.9 THOU/uL    Eos # K/uL 0.9 (H) 0.0 - 0.4 THOU/uL    Baso # K/uL 0.1 0.0 - 0.1 THOU/uL    Nucl RBC % 0.0 0.0 - 0.2 /100 WBC    Nucl RBC # K/uL 0.0 0.0 - 0.0 THOU/uL   Basic metabolic panel    Collection Time: 06/12/17 11:31 PM   Result Value Ref Range    Glucose 86 60 - 99 mg/dL    Sodium 132 (L) 133 - 145 mmol/L    Potassium 5.1 3.3 - 5.1 mmol/L    Chloride 97 96 - 108 mmol/L    CO2 23 20 - 28 mmol/L    Anion Gap 12 7 - 16    UN 11 6 - 20 mg/dL    Creatinine 0.53 0.51 - 0.95 mg/dL    GFR,Caucasian 96 *    GFR,Black 111 *    Calcium 8.4 (L) 8.6 - 10.2 mg/dL   APTT    Collection Time: 06/12/17 11:31 PM   Result Value Ref Range    aPTT 36.6 25.8 - 37.9 sec   Diff manual    Collection Time: 06/12/17 11:31 PM   Result Value Ref  Range     Myelocyte % 1 (H) 0 - 0 %    Giant PLTs Present     Manual DIFF RESULTS     Diff Based On 114 CELLS   Magnesium    Collection Time: 06/12/17 11:31 PM   Result Value Ref Range    Magnesium 1.6 1.6 - 2.5 mg/dL   Phosphorus    Collection Time: 06/12/17 11:31 PM   Result Value Ref Range    Phosphorus 5.1 (H) 2.7 - 4.5 mg/dL   Venous blood gas from the PICC line    Collection Time: 06/13/17  9:21 AM   Result Value Ref Range    Hemoglobin 7.7 (L) 11.2 - 15.7 g/dL    PH,VENOUS 7.42 7.32 - 7.42    PCO2,VENOUS 40 40 - 50 mm Hg    PO2,VENOUS 36 25 - 43 mm Hg    Bicarbonate,VENOUS 25 21 - 28 mmol/L    Base Excess,VENOUS 0 -3 - 2 mmol/L    CO2 (Calc),VENOUS 26 22 - 31 mmol/L    FO2 HB,VENOUS 58 (L) 63 - 83 %    CO 1.4 %    Methemoglobin 0.3 0.0 - 1.0 %   Platelet count    Collection Time: 06/13/17 11:49 AM   Result Value Ref Range    Platelets 582 (H) 160 - 370 THOU/uL   Sedimentation rate, automated    Collection Time: 06/13/17  6:07 PM   Result Value Ref Range    Sedimentation Rate 65 (H) 0 - 30 mm/hr     *Note: Due to a large number of results and/or encounters for the requested time period, some results have not been displayed. A complete set of results can be found in Results Review.       Radiology impressions (last 3 days):  No results found.    Currently Active/Followed Hospital Problems:  Active Hospital Problems    Diagnosis    *!*Pancreatic adenocarcinoma s/p Whipple 03/29/17     -Whipple on 1/25 with Dr. 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 by hematoma, PTC drain 2/12 in IR  -Continue TPN with SSI for glucose control  - CT 2/18 with persistent fluid collection   - IR for drainage, 60 mL 2/19   - Gram stain with GNB and GPC in pairs and chains - culture prelim enterobacter cloacae and gandida glabrata  - Caspo/Zosyn x10 days from 2/19 per ID      Sepsis     - Occurring in setting of biliary obstruction s/p PTC external/internal  placement for source control  - BCx 2/12 Enterobacter 72hrs to growth, Zosyn (2/12-2/18) Vanco (2/12-2/14),  - ID following, BC 2/21 NGTD, Caspo zosyn x 10 days from 2/19       hx of Pulmonary embolism     - PE 01/18/17 on CT chest   - This hospitalization has been on therapeutic Lovenox, and Heparin gtt  - Currently holding therapeutic anticoagulation in setting of hematoma and ongoing risk for bleeding  - Heparin SQ TID  - Per HBP - will not restart therapeutic anticoagulation in the near future      Cancer associated pain     Methadone at home  Pain management guided by patient's primary pain physician Dr Irven [place]  - QTc acceptable, monitor for prolongation  - consider PCA when appropriate  - Methadone 76m PO q8h  - Palliative following, dilaudid PRN, ativan  Depression     -Continue Elavil and Lexapro      Hypothyroidism     -Synthroid          Assessment: [name] w/ PMH fibromyalgia, pancreatic CA s/p Whipple for 'cure' 03/29/17; s/p ICU w/ intub for resp failure & septic shock, weaned from pressors and extubated to HFNC 2/17 w/ IR drainage bili fluid collection 2/19, s/p IV abx, now all abd drains for abscesses removed. Palliative following for symptoms, started methadone 2/19, weaned dose 2/23 d/t concern for sedation. Pt w/ ongoing poor po intake, now tolerating bolus fds via dubhoff fdg tube. Ariving to IR for G tube placement.     Plan: Place G tube.     Author: Argentina Ponder, MD  Note created: 06/14/2017  at: 3:04 PM

## 2017-06-19 NOTE — Procedures (Signed)
Procedure Report    Please enter procedure, pre- and and post-procedure diagnoses in fields above and removed this line of text.    PROCEDURE NOTE    Time out documentation completed in Procedure navigator prior to procedure:  Yes    Indications  Retrogastric fluid collection.    Procedure Details  Placement of 10 F APDL drain in left retrogastric fluid collection.    Guide Wire Removed : yes    Findings  Purulent fluid aspirated.    Complications  Slightly increased stranding around the collection post procedure. Please note there was also contrast in the fluid collection concerning for continuity with bowel.     Condition  good    Plan/Orders  Fluid analysis.    EBL: 10 cc.    Specimens  yes    Disposition  Floor.     Lanney Gins, MD  06/19/2017  5:26 PM

## 2017-06-19 NOTE — Invasive Procedure Plan of Care (Signed)
Invasive Procedure Plan of Care (Consent Form 419):   Condition(s) Addressed: Abnormal fluid collection in liver which may be infected   Performing Provider: Jacolyn Reedy [facility], and Vascular and Interventional Radiology Attendings, Fellows, Residents and Advanced Practice Providers      Side:    Procedure: Fluid aspiration or drainage of liver fluid collection.   Special Equipment:    Planned Anesthesia: Pending   Benefits: Symptomatic relief by aspirating or draining fluid. Obtain samples to guide further management   Risks: Bleeding, infection, peritonitis, fistula, injury to surrounding organs and structures, and in very rare circumstances death.   Alternatives: Not to perform the procedure   Expected Length of Stay: 0 day(s)     I, or a designated member of my surgical team, have discussed the planned procedure, including the potential for any transfusion of blood products or receipt of tissue as necessary, expected benefits, the potential complications and risks and possible alternatives and their benefits and risks with the patient or the patient's surrogate. In my opinion, the patent or the patient's surrogate understands the proposed procedure, its risks, benefits, and alternatives.    Electronically signed by Lanney Gins, MD at 3:36 PM     Patient Consent:  I hereby give my consent and authorize Jacolyn Reedy [facility], and Vascular and Interventional Radiology Attendings, Fellows, Residents and Advanced Practice Providers     (The list of possible assistants, all of whom are privileged to provide surgical services at the hospital, is available)  To treat the following: Abnormal fluid collection in liver which may be infected  Procedure includes: Fluid aspiration or drainage of liver fluid collection.  Laterality:   1 The care provider has explained my condition to me, the benefits of having the above treatment procedure, and alternate ways of treating my condition. I understand that no guarantees have  been made to me about the result of the treatment. The alternatives to this procedure include: Not to perform the procedure   2 The care provider has discussed with me the reasonably foreseeable risks of the treatment and that there may be undesirable results. The risks that are specifically related to this procedure include: Bleeding, infection, peritonitis, fistula, injury to surrounding organs and structures, and in very rare circumstances death.   3 I understand that during the treatment a condition may be discovered which was not known before the treatment started. Therefore, I authorize the care provider to perform any additional or different treatment which is thought necessary and available.   4 Any tissue, parts, or substances removed during the procedure may be retained or disposed of in accordance with customary scientific, educational and clinical practice.   5 Vendor information if appropriate:    6 If blood products are needed, I would agree:    7 If tissue products are needed, I would agree:    8 Blood/Tissue use limitations and/or exclusions:       I have carefully read and fully understand this informed consent form, and have had sufficient opportunity to discuss my condition and the above procedure(s) with the care provider and his/her associates, and all of my questions have been answered to my satisfaction. I understand that my surgeon/provider performing the procedure may not be physically present in the operating/procedure room the entire time that I am there. My surgeon/provider has answered my questions regarding this and how it may relate to my surgery/procedure. I agree to the Plan of Care as outlined above.  Patient Signature   (or Parent/Legal Guardian if pt is unable to sign or is a minor)  Date/Time     Electronic Signatures will display at the bottom of the consent form.

## 2017-06-19 NOTE — Progress Notes (Signed)
Interventional Radiology Pre-Procedure Handoff/Checklist    NPO: [x] YES [] NO [] N/A Multiple Days    Tube feed Stopped: [] YES [] NO [x] N/A     Anticoagulants: None    Telemetry: [x] YES [] NO [] N/A RN To come with t    Transport mode: Bed  Number of Transporters needed: 2      IV access:   PICC Double Lumen (Power) 06/13/17 Clamped Left Upper Extremity (Active)   Phlebitis Scale Grade 0 06/19/2017  4:00 AM   Infiltration Scale Grade 0 06/19/2017  4:00 AM   Proximal Lumen Status Normal saline locked;Capped 06/19/2017  4:00 AM   Distal Lumen Status Infusing 06/19/2017  4:00 AM   External Length Check (cm) 2 cm 06/13/2017  9:33 AM   AC Circumference (cm) 27 cm 06/13/2017  9:33 AM   Upper Arm Circumference (cm) 30 cm 06/13/2017  9:33 AM   Dressing Type BioPatch;Transparent 06/19/2017  4:00 AM   Dressing Status Clean, dry and intact 06/19/2017  4:00 AM   (T)Transparent Drsg Change-Q7D Dressing changed 06/13/2017  9:33 AM   Dressing Change Due 06/20/17 06/19/2017  4:00 AM   Bag to hub changed? No 06/19/2017  4:00 AM   Next bag to hub change due 06/20/17 06/19/2017  4:00 AM   Cap(s) Changed? No 06/19/2017  4:00 AM   Next cap change due 06/20/17 06/19/2017  4:00 AM   **Need for continuing central line addressed? Yes - central line still necessary 06/19/2017  4:00 AM       Peripheral IV 06/11/17 0900 Right Wrist (Active)   Phlebitis Scale Grade 0 06/19/2017  7:41 AM   Infiltration Scale Grade 0 06/19/2017  7:41 AM   Line Status Saline locked 06/19/2017  7:41 AM   Dressing Type Transparent 06/19/2017  7:41 AM   Dressing Status Clean, dry and intact 06/19/2017  7:41 AM       Respiratory: Room Air    Consentable: Yes    Alert and Oriented to person, place and time?: Yes    Code Status:Full     Does patient wear an insulin pump? [] YES [x] NO [] N/A     Precautions:none    Allergies: No Known Allergies (drug, envir, food or latex)    Gertie Fey, RN Received Handoff Report from [facility] for IR procedure 10:25 AM

## 2017-06-19 NOTE — Progress Notes (Signed)
Infectious Diseases (Team 3) Follow Up Note    Personally reviewed chart, labs, medications. Patient seen.     CC:  F/U VRE bacteremia, Liver and abdominal abscesses  F/U Enterobacter cloacae bacteremia, Intra-abdominal abscesses positive for VRE, enterobacter and candida glabrata    Subjective: Amanda Galloway states she is very scared about enduring more pain r/t pending IR procedure (ie, drainage tubes). She reports having pain "all over", particularly around PEG tube location, NPO currently with PEG to gravity drainage bag. Denies N/V/D/F/C.        ROS: Reviewed 6 systems in detail, see above    Current Meds:  Scheduled Meds:   daptomycin IV  10 mg/kg Intravenous Q24H    methadone IV  2.5 mg Intravenous TID    sodium chloride  1 g Oral TID WC    ferrous sulfate  325 mg Oral Daily with breakfast    lactobacillus rhamnosus (GG)  1 capsule Oral Daily    fluconazole (DIFLUCAN) IV  800 mg Intravenous Q24H    ertapenem  1,000 mg Intravenous Q24H    methylphenidate  2.5 mg Oral BID    polyethylene glycol  17 g Per G Tube Daily    lidocaine  1 patch Transdermal Q24H    triamterene-hydrochlorothiazide  1 tablet Oral QAM    lidocaine  1 patch Transdermal Q24H    bisacodyl  10 mg Rectal Daily    DULoxetine  20 mg Oral Daily    docusate sodium  200 mg Oral 2 times per day    acetaminophen  1,000 mg Oral Q8H    levothyroxine  137 mcg Oral Daily     Continuous Infusions:   dextrose 5 % and 0.9% NaCl 100 mL/hr (06/19/17 1430)    HYDROmorphone       PRN Meds:.   calcium carbonate  1,000 mg Oral BID PRN    HYDROmorphone PF  0.5 mg Intravenous Q1H PRN    Or    HYDROmorphone PF  1 mg Intravenous Q1H PRN    LORazepam  0.5 mg Oral Q6H PRN    heparin lock flush  50 Units Intracatheter PRN    ondansetron  4 mg Intravenous Q6H PRN       Objective:    BP: (102-115)/(53-76)   Temp:  [35.9 C (96.6 F)-36.5 C (97.7 F)]   Temp src: Temporal (04/17 1427)  Heart Rate:  [88-109]   Resp:  [14-16]   SpO2:  [93 %-98 %]    Weight:  [76.9 kg (169 lb 8 oz)]     General Appearance: sitting in chair, leaning to right side, appears uncomfortable, frequently grimacing, voice even softer than usual making it difficult to hear her.   HEENT: Sclera anicteric, OP clear  Pulm: lung sounds diminished throughout.   CV: RRR, Normal S1S2, no M/R/G  Abdomen:  Distended, TTP all except LLQ, hypoactive bowel sounds, PEG tube site benign with, no erythema or drainage.   Extremities: MAE, WWP  Skin:Warm and dry to touch; no rashes  Neuro: alert and oriented, hypophonia   Lines/Drains/Tubes: 4/9 PIV R wrist, PICC LUE 4/11,  PEG (4/12)      Recent Labs  Lab 06/19/17  0020 06/18/17  0102 06/16/17  2332   WBC 11.1* 14.5* 19.0*   Hemoglobin 6.9* 7.0* 6.8*   Hematocrit 22* 23* 21*   Platelets 593* 558* 523*   Seg Neut % 75.0 79.8 83.4   Lymphocyte % 11.1 9.2 5.7   Monocyte % 9.4 6.8 8.6  Eosinophil % 3.2 2.9 1.3         Recent Labs  Lab 06/19/17  0020 06/18/17  0102 06/16/17  2332   Sodium 128* 129* 129*   Potassium 4.6 4.4 4.3   CO2 '25 23 22   ' UN '10 10 9   ' Creatinine 0.50* 0.44* 0.46*   Glucose 85 91 88   Calcium 8.5* 8.1* 8.0*         Lab results: 06/16/17  2332 06/10/17  0106 06/07/17  0031 06/06/17  0114 06/05/17  2305   Total Protein 5.8* 6.3 6.7 6.8 7.7   Albumin 2.1* 2.2* 2.3* 2.4* 2.6*   ALT 37* 57* 49* 47* 49*   AST 37* 91* 76* 69* 68*   Alk Phos 474* 407* 453* 489* 535*   Bilirubin,Total <0.2 <0.2 <0.2 <0.2 <0.2       Lab Results  Component Value Date/Time   CRP 282 (H) 06/18/2017 0102   CRP 248 (H) 06/16/2017 0218   CRP 170 (H) 06/15/2017 0206       Lab Results  Component Value Date/Time   Sedimentation Rate 54 (H) 06/18/2017 0102   Sedimentation Rate 65 (H) 06/13/2017 1807   Sedimentation Rate 69 (H) 06/09/2017 0401         Lab results: 06/19/17  0020   CK 29       Micro:   1/25: Intraoperative bile: GS 0 pmns, no organisms, Cx: no growth  2/12: 1 set of blood cultures from the right Mediport (no peripheral bld was sent): positive for  Enterobacter cloacae complex in 70 hrs, sensitive to zosyn.   2/18 abdominal GS had >25 PMNs, many GPC in pairs/chains, many GNB and cultures 4+ Enterobacter cloacae complex. The cultures also grew 1+ Candida glabrata   2/21: BCx from the Right peripheral IV, Mediport, L IJ:  No growth  05/03/17: IR-guided I&D with drain placement of the left upper quadrant collection: GS > 25 PMNs, many GNB and GN diplococci, Cultures with 4+ Enterobacter cloacae complex (resistant to Zosyn and CTX), C. Albicans and C. Glabrata, and Candida tropicalis  05/04/17:    1 set of blood cultures from left IJ: VRE at 15.3 TTP. Daptomycin sensitivities: MIC 36mg/ml (dose dependent),  Linezolid   1 set from periphery: VRE and Enterobacter cloacae at 14.8 TTP   05/05/17:  IR-guided I&D with drain placement of the intrahepatic collection: GS 1-10 PMNs, very few GPC in pairs. Fungal cultures with Candida glabrata  05/05/17: Blood cultures from periphery, Mediport and IJ: no growth  05/04/17: Urine cultures obtained (for unclear reason): no growth  05/08/17: Abscess GS: >25 PMNs, many GPC in chains, few GNB. Aerobic cultures growing 2+ Enterobacter (ertapenem, Cipro, TMP/SMX-sensitive) and 3+VRE, Fungal: C. Glabrata (sens to caspo, vori and dose dependent sens to fluconazole)    06/06/17: 2 sets of BCx (1 set each from periphery and Mediport) - taken after restarting ertapenem: IVAD grew VRE ttp 18.2 hours,  Periphery (Left arm) grew VRE ttp 17.3.    06/07/17 Pleural fluid (left) GS 1-10 PMN's, 1-10 Nucleated white cells, No organisms seen and 1604 nucleated cells. No growth on culture.     06/07/17 Abdominal abscess: labs cancelled - no specimens received.     06/08/17: Mediport BC + for VRE in 23 hrs (R-Amp, R- tetracycline per lab, suppressed result, R- doxycycline;  S-Linezolid, S-Dapto dose dependent),  Right hand peripheral cx: No growth     06/10/17: Blood culture 2/2: 1 periphery (RHand), 1 from Mediport  before removed: no growth    06/11/17: Blood Culture 2/2  (both periphery) - no growth     Imaging/Other Relevant Diagnostics:     06/17/17 CT abdomen and pelvis with contrast:   1. New 7 x 4.9 cm air and fluid collection posterior to the stomach consistent with abscess. The amount of air within this indicates there is likely a communication with the GI tract, the exact site of communication could not be definitively delineated.  Given that there is a recent G-tube placement and the configuration of adjacent small bowel loops there is concern for a leak from the gastrojejunal anastomosis or a small bowel loop that might have been injured or incorporated into this area.  The balloon of the G-tube is likely in the small bowel-gastric anastomotic area and given the distention of the gastric remnant it could be contributing to a partial gastric outlet obstruction.  2. Marked interval increase in size of the largest intrahepatic fluid collection, now 7.7 x 6 cm.  There is also increase size in the nearby right lobe fluid collection which is now 4.7 x 2.1 cm.  Differential diagnosis includes abscesses and/or bilomas. Other small  liver abnormalities are not significantly changed.  3. Multiloculated left upper quadrant fluid collections and a porta hepatis fluid collection are not significantly changed.  4. Slight increased size of right pelvic fluid collections, the largest is 3.2 x 2.2 cm  5. Contrast distends the visualized lower esophagus and a moderate size hiatal hernia. This places the patient risk for aspiration.  6. Large left pleural effusion, increased since before. Partly visualized left lower lobe is now nearly completely atelectatic and there is increased moderate right lower lobe atelectasis.  7. There are also areas of soft tissue thickening in the porta hepatis area and increased size in the left para-aortic node. These findings can be followed on subsequent studies, a PET/CT scan may be helpful in future follow-up for any areas of potential tumor once areas of  presumed infection subside.      Assessment and Plan:  ID problem(s):   -Enterobacter cloacae bacteremia  -Multiple and extensive Intra-abdominal abscesses growing VRE, enterobacter and candida glabrata    72yo old female with pancreatic cancer, s/p Whipple procedure on 03/29/17 after neoadjuvant chemotherapy and radiation. Her post op course was c/b delayed gastric emptying, gastric distension and obstruction and a large surgical bed hematoma causing CBD and hepatic vein compression, severe biliary sepsis, s/p placement of a right hepatic duct external biliary drain placed on 2/12, found to have enterobacter cloacae bacteremia (2/12) presumed to be secondary to infection of the hematoma. However CT of abdomen (2/18) showed the hematoma had resolved but noted to have a well-circumscribed and larger anterior abdominal collection percutaneously, which was aspirated (2/19) and cultures grew enterobacter cloacae and Candida glabrata. ID service consulted on 2/21 with question of treating C.glabrata.  Caspofungin was started and a perc drain was placed LUQ 3/1, then anterior abdominal perc drain placed 3/3, and then anterior perc drain into left hepatic collection 05/08/17, the goal being source control with drain placements. She had been on Zosyn but her enterobacter became (amp-C inducible) thus switched to ertapenem and Daptomycin added on 05/04/17 for continued broad coverage.  Caspofungin was switched to oral high-dose fluconazole (82m/kg) daily once sensitivities returned on C.glabrata. Pt on methadone for pain control, QTc 409. Abdominal perc drains where removed once drainage was minimal, biliary tube was removed last week. However, there remained several communicating multiloculated fluid collections  in LUQ and some areas of reaccumulation on 05/23/17 CT of a/p.    Summary of abdominal drain removals: Mid Abd on 3/17,  LUQ on 3/19, Biliary Tube on 3/26, right lateral abd on 05/29/17.  She was switched over to oral  therapy on 05/30/17 in hopes to ready her for SNF Rehab, ie, changed to Bactrim for enterobacter and to remain on high dose fluconazole for C. Glabrata, with plan to reimage to fluid collections. Conferred with ID attending and ID pharmacist, unfortunately there was no good oral option for VRE given drug-to-drug interactions with Linezolid. She was initially  stable on oral antibiotic regimen and ID signed off on 06/03/17.     ID Fellow team was re-consulted on 4/4 for with concern for early sepsis given patient now with rigor, lethargy, and tachycardia, however she was afebrile and no leukocytosis at that time.  A CT of her abdomen and pelvis was obtain as well as blood cultures (both on 06/06/17). CT demonstrated interval development of a new loculated 3 cm fluid collection in the right hepatic lobe, while the hepatic fluid collections remain stable in size. She also had chest CT which showed a moderate left pleural effusion (no change from prior). She as empirically restarted on ertapenem 1gm daily and daptomycin at 37m/kg as she was on prior to oral regimen, and continued on fluconazole 8090mdaily.  She underwent a left thoracentesis on 4/5 with pigtail placement. The pleural fluid analysis c/w exudative and there were 1604 nucleated cells. No growth on cultures, pigtail removed on 06/09/17.  Blood cultures from 4/4, both periphery and Mediport grew VRE, and subsequently blood cultures on 4/6 and 4/8 were positive for VRE only Mediport.  Initially VRE bacteremia due to the pelvic abscesses, but now VRE seeded mediport which was removed on 06/10/17.   A PICC was placed on 06/13/17, once BldCx remained negative for 48 hours. SyShela Leffad a PEG placed 4/12 with post procedure leukocytosis, this is now resolving. She has had persistent, severe pain around PEG site, palliative care and primary team have been addressing with pain medication adjustments and alternative modalities. A repeat CT of abdomen and pelvis from 4/15  demonstrated enlarging and new fluid collections c/w abscesses (see above)       Today SyKyleappears very weak and uncomfortable. She is c/o fear regarding planned IR procedure for hepatic and abdominal drain placement.  I discussed with her the probably futility of attempting to treat fluid collections with antibiotics and antifungal alone without adequate source control. I spent time with her hearing her concerns about pain and her ability to endure, emotional support given. She was assured all involved teams are working on her behalf to help her through this, recognizing the difficult time she is having, with hopes this rough journey will start to improve soon.        Recommendations:   Drainage of largest/accessible intraabominal and hepatic fluid collections, please send for GS and culture.    Continue Daptomycin to 102mg daily (round to 800 mg), IV Ertapenem 1 gram daily, and IV or PO fluconazole 800 mg daily.   Follow weekly CK while on Daptomycin   Follow CBC with diff daily for now, reduce frequency once stabilizes   Follow BMP twice weekly, or more often as indicated.     Follow liver chemistries, CRP, ESR weekly.     Thank you for allowing us [name] participate in the care of this patient  Please call with questions/concerns.  Lonzo Candy, ANP-BC  Infectious Diseases, Team 3  Pager [phone]  Office Ph [phone]

## 2017-06-19 NOTE — Invasive Procedure Plan of Care (Signed)
Invasive Procedure Plan of Care (Consent Form 419):   Condition(s) Addressed: Abnormal fluid collection in abdomen which may be infected   Performing Provider: Jacolyn Reedy [facility], and Vascular and Interventional Radiology Attendings, Fellows, Residents and Advanced Practice Providers      Side:    Procedure: Fluid aspiration or drainage of fluid collection posterior to the stomach.   Special Equipment:    Planned Anesthesia: Pending   Benefits: Symptomatic relief by aspirating or draining fluid. Obtain samples to guide further management   Risks: Bleeding, infection, peritonitis, fistula, injury to surrounding organs and structures, and in very rare circumstances death.   Alternatives: Not to perform the procedure   Expected Length of Stay: 0 day(s)     I, or a designated member of my surgical team, have discussed the planned procedure, including the potential for any transfusion of blood products or receipt of tissue as necessary, expected benefits, the potential complications and risks and possible alternatives and their benefits and risks with the patient or the patient's surrogate. In my opinion, the patent or the patient's surrogate understands the proposed procedure, its risks, benefits, and alternatives.    Electronically signed by Lanney Gins, MD at 3:37 PM     Patient Consent:  I hereby give my consent and authorize Jacolyn Reedy [facility], and Vascular and Interventional Radiology Attendings, Fellows, Residents and Advanced Practice Providers     (The list of possible assistants, all of whom are privileged to provide surgical services at the hospital, is available)  To treat the following: Abnormal fluid collection in abdomen which may be infected  Procedure includes: Fluid aspiration or drainage of fluid collection posterior to the stomach.  Laterality:   1 The care provider has explained my condition to me, the benefits of having the above treatment procedure, and alternate ways of treating my  condition. I understand that no guarantees have been made to me about the result of the treatment. The alternatives to this procedure include: Not to perform the procedure   2 The care provider has discussed with me the reasonably foreseeable risks of the treatment and that there may be undesirable results. The risks that are specifically related to this procedure include: Bleeding, infection, peritonitis, fistula, injury to surrounding organs and structures, and in very rare circumstances death.   3 I understand that during the treatment a condition may be discovered which was not known before the treatment started. Therefore, I authorize the care provider to perform any additional or different treatment which is thought necessary and available.   4 Any tissue, parts, or substances removed during the procedure may be retained or disposed of in accordance with customary scientific, educational and clinical practice.   5 Vendor information if appropriate:    6 If blood products are needed, I would agree:    7 If tissue products are needed, I would agree:    8 Blood/Tissue use limitations and/or exclusions:       I have carefully read and fully understand this informed consent form, and have had sufficient opportunity to discuss my condition and the above procedure(s) with the care provider and his/her associates, and all of my questions have been answered to my satisfaction. I understand that my surgeon/provider performing the procedure may not be physically present in the operating/procedure room the entire time that I am there. My surgeon/provider has answered my questions regarding this and how it may relate to my surgery/procedure. I agree to the Plan of Care as outlined above.  Patient Signature   (or Parent/Legal Guardian if pt is unable to sign or is a minor)  Date/Time     Electronic Signatures will display at the bottom of the consent form.

## 2017-06-19 NOTE — Progress Notes (Signed)
Attempted to visit w/ pt this afternoon, she was off floor. Pt case discussed w/ RNs and understand pt's pain better on PCA, received low dose ativan prn w/ + effect, pt feeling down and needing encouragement to participate in her care and ADLs. Unfortunately pt is not able to get her cymbalta or ritalin now that she is on bowel rest and would anticipate withdrawal symptoms. May consider scheduling low dose ativan IV ATC as short term measure to control anxiety though need to balance benefit vs risk of sedation.     Writer is off tomorrow, please call my PC colleagues if pt needs to be seen. Pt was discussed w/ our PC fellow Georgiana Spinner yesterday so she is familiar w/ case. Otherwise will plan to see again on Friday.    Elroy Channel NP  Palliative Care Consult Service  Pager # [phone]

## 2017-06-19 NOTE — Progress Notes (Signed)
Assumed care of patient at 0700. VSS, complaining of pain around PEG site. PCA running, IV dilaudid x2. Ativan ordered and given per [facility] with positive effect. Patient down to IR for drain placement on life pack with Mikey Bussing, NP. Will continue to monitor and assess.     Glendell Docker, RN

## 2017-06-19 NOTE — Procedures (Signed)
Procedure Report    Please enter procedure, pre- and and post-procedure diagnoses in fields above and removed this line of text.    PROCEDURE NOTE    Time out documentation completed in Procedure navigator prior to procedure:  Yes    Indications  Liver fluid collection.    Procedure Details  Liver drain placement, 10 F APDL    Guide Wire Removed : yes    Findings Purulent fluid aspirated.    Complications  None.    Condition  good    Plan/Orders  Fluid analysis    EBL: 5 cc.     Specimens  yes    Disposition  Floor.     Lanney Gins, MD  06/19/2017  5:24 PM

## 2017-06-19 NOTE — Interval H&P Note (Signed)
UPDATES TO PATIENT'S CONDITION on the DAY OF SURGERY/PROCEDURE    I. Updates to Patient's Condition (to be completed by a provider privileged to complete a H&P, following reassessment of the patient by the provider):    Day of Surgery/Procedure Update:  History  History reviewed and no change    Physical  Physical exam updated and no change    Pre-Procedure Assessment  Airway Visibility: Soft palate  Loose/Broken Teeth, Oral Piercings: Not Applicable  Lungs: Comment   Heart sounds: Rate: 75 bpm  Heart sounds rhythm: Regular Rate Rhythm  ASA Physical status rating: Class III: Severe systemic disease       II. Procedure Readiness   I have reviewed the patient's H&P and updated condition. By completing and signing this form, I attest that this patient is ready for surgery/procedure.    III. Attestation   I have reviewed the updated information regarding the patient's condition and it is appropriate to proceed with the planned surgery/procedure.    Dallas Breeding, MD as of 4:34 PM 06/19/2017

## 2017-06-19 NOTE — Invasive Procedure Plan of Care (Signed)
Invasive Procedure Plan of Care (Consent Form 419):   Condition(s) Addressed: Need for moderate sedation during procedure   Performing Provider: Jacolyn Reedy [facility], and Vascular and Interventional Radiology Attendings, Fellows, Residents and Advanced Practice Providers     Side: Not applicable    Procedure: Establishment of moderate sedation   Special Equipment: none   Planned Anesthesia: Moderate Sedation   Benefits: State of altered consciousness created by administration of intravenous medications (medications in an IV). Patients under moderate sedation will feel sleepy/relaxed, but will respond to commands. The purpose of moderate sedation is to make you feel comfortable during your procedure, and can make your procedure easier to perform.   Risks: Excessive sedation, which may require special procedures to keep you safe.  Nausea, vomiting, problems with heart rate or blood pressure, breathing difficilties, very rarely death.   Alternatives: Not undergoing procedure, undergoing procedure without sedation.   Expected Length of Stay:  day(s)     I, or a designated member of my surgical team, have discussed the planned procedure, including the potential for any transfusion of blood products or receipt of tissue as necessary, expected benefits, the potential complications and risks and possible alternatives and their benefits and risks with the patient or the patient's surrogate. In my opinion, the patent or the patient's surrogate understands the proposed procedure, its risks, benefits, and alternatives.    Electronically signed by Lanney Gins, MD at 3:36 PM     Patient Consent:  I hereby give my consent and authorize Jacolyn Reedy [facility], and Vascular and Interventional Radiology Attendings, Fellows, Residents and Advanced Practice Providers    (The list of possible assistants, all of whom are privileged to provide surgical services at the hospital, is available)  To treat the following: Need for  moderate sedation during procedure  Procedure includes: Establishment of moderate sedation  Laterality: Not applicable  1 The care provider has explained my condition to me, the benefits of having the above treatment procedure, and alternate ways of treating my condition. I understand that no guarantees have been made to me about the result of the treatment. The alternatives to this procedure include: Not undergoing procedure, undergoing procedure without sedation.   2 The care provider has discussed with me the reasonably foreseeable risks of the treatment and that there may be undesirable results. The risks that are specifically related to this procedure include: Excessive sedation, which may require special procedures to keep you safe.  Nausea, vomiting, problems with heart rate or blood pressure, breathing difficilties, very rarely death.   3 I understand that during the treatment a condition may be discovered which was not known before the treatment started. Therefore, I authorize the care provider to perform any additional or different treatment which is thought necessary and available.   4 Any tissue, parts, or substances removed during the procedure may be retained or disposed of in accordance with customary scientific, educational and clinical practice.   5 Vendor information if appropriate: If a vendor representative is expected to be present during my procedure, it has been explained to me that the vendor representative works for (manufacturer of the device to be used) and that his/her role includes . I consent to the vendor representative's presence and involvement as described. If circumstances change and a decision is made during my procedure that a vendor representative's presence is needed, I will be notified of the above after my procedure is completed.  Equipment: none   6 If blood products are needed, I  would agree:    7 If tissue products are needed, I would agree:    8 Blood/Tissue use  limitations and/or exclusions:       I have carefully read and fully understand this informed consent form, and have had sufficient opportunity to discuss my condition and the above procedure(s) with the care provider and his/her associates, and all of my questions have been answered to my satisfaction. I understand that my surgeon/provider performing the procedure may not be physically present in the operating/procedure room the entire time that I am there. My surgeon/provider has answered my questions regarding this and how it may relate to my surgery/procedure. I agree to the Plan of Care as outlined above.                       Patient Signature   (or Parent/Legal Guardian if pt is unable to sign or is a minor)  Date/Time     Electronic Signatures will display at the bottom of the consent form.

## 2017-06-19 NOTE — Progress Notes (Addendum)
[employer] Nursing Procedure Note    Amanda Galloway  [id number]    Procedure: Perc drain placement x 2        Status: Completed    Patient tolerated procedure well     Specimen Collection: yes    Sponge count: N/A    Fluid Removed:Yes, color:purulent, amount: 20 ml  Procedure Dressing Site located:Abdomen left and right  Dressing Type:sterile gauze/tegaderm left and right  Biopatch:no  Dressing status:Clean, dry and intact left and right  Hematoma:Not evident left and right  Medication received:Versed 2mg  and Fentanyl 14mcg  Cardiovascular:   Peripheral Pulses: N/A      Fistula: N/A    Neuro Assessment:N/A    Implant patient information given to patient or parent/guardian:N/A    Report given to:Unit Nurse. Unit [facility] Name Rip Harbour, RN      Last Filed Vitals    06/19/17 1715   BP: 139/77   Pulse: 108   Resp: (!) 31   Temp:    SpO2: 98%

## 2017-06-20 ENCOUNTER — Ambulatory Visit: Payer: Medicare (Managed Care) | Admitting: Radiation Oncology

## 2017-06-20 ENCOUNTER — Inpatient Hospital Stay: Payer: Medicare (Managed Care)

## 2017-06-20 LAB — CBC AND DIFFERENTIAL
Baso # K/uL: 0.1 10*3/uL (ref 0.0–0.1)
Basophil %: 0.8 %
Eos # K/uL: 0.2 10*3/uL (ref 0.0–0.4)
Eosinophil %: 2.1 %
Hematocrit: 21 % — ABNORMAL LOW (ref 34–45)
Hemoglobin: 6.4 g/dL — ABNORMAL LOW (ref 11.2–15.7)
IMM Granulocytes #: 0.1 10*3/uL
IMM Granulocytes: 1.1 %
Lymph # K/uL: 1.2 10*3/uL (ref 1.2–3.7)
Lymphocyte %: 10.7 %
MCH: 27 pg/cell (ref 26–32)
MCHC: 31 g/dL — ABNORMAL LOW (ref 32–36)
MCV: 89 fL (ref 79–95)
Mono # K/uL: 1 10*3/uL — ABNORMAL HIGH (ref 0.2–0.9)
Monocyte %: 9.2 %
Neut # K/uL: 8.2 10*3/uL — ABNORMAL HIGH (ref 1.6–6.1)
Nucl RBC # K/uL: 0 10*3/uL (ref 0.0–0.0)
Nucl RBC %: 0.1 /100 WBC (ref 0.0–0.2)
Platelets: 530 10*3/uL — ABNORMAL HIGH (ref 160–370)
RBC: 2.3 MIL/uL — ABNORMAL LOW (ref 3.9–5.2)
RDW: 16.5 % — ABNORMAL HIGH (ref 11.7–14.4)
Seg Neut %: 76.1 %
WBC: 10.8 10*3/uL — ABNORMAL HIGH (ref 4.0–10.0)

## 2017-06-20 LAB — COMPREHENSIVE METABOLIC PANEL
ALT: 45 U/L — ABNORMAL HIGH (ref 0–35)
AST: 65 U/L — ABNORMAL HIGH (ref 0–35)
Albumin: 2.1 g/dL — ABNORMAL LOW (ref 3.5–5.2)
Alk Phos: 496 U/L — ABNORMAL HIGH (ref 35–105)
Anion Gap: 13 (ref 7–16)
Bilirubin,Total: <0.2 mg/dL (ref 0.0–1.2)
CO2: 25 mmol/L (ref 20–28)
Calcium: 8.2 mg/dL — ABNORMAL LOW (ref 8.6–10.2)
Chloride: 94 mmol/L — ABNORMAL LOW (ref 96–108)
Creatinine: 0.51 mg/dL (ref 0.51–0.95)
GFR,Black: 112 *
GFR,Caucasian: 97 *
Glucose: 103 mg/dL — ABNORMAL HIGH (ref 60–99)
Lab: 7 mg/dL (ref 6–20)
Potassium: 4.3 mmol/L (ref 3.3–5.1)
Sodium: 132 mmol/L — ABNORMAL LOW (ref 133–145)
Total Protein: 5.7 g/dL — ABNORMAL LOW (ref 6.3–7.7)

## 2017-06-20 LAB — MCHC
MCHC: 32 g/dL (ref 32–36)
MCHC: 32 g/dL (ref 32–36)

## 2017-06-20 LAB — PHOSPHORUS: Phosphorus: 5.2 mg/dL — ABNORMAL HIGH (ref 2.7–4.5)

## 2017-06-20 LAB — AFB STAIN
AFB Stain: 0
AFB Stain: 0
AFB Stain: 0

## 2017-06-20 LAB — HCT AND HGB
Hematocrit: 25 % — ABNORMAL LOW (ref 34–45)
Hemoglobin: 8 g/dL — ABNORMAL LOW (ref 11.2–15.7)

## 2017-06-20 LAB — POCT GLUCOSE: Glucose POCT: 124 mg/dL — ABNORMAL HIGH (ref 60–99)

## 2017-06-20 LAB — PROTIME-INR
INR: 1.5 — ABNORMAL HIGH (ref 0.9–1.1)
Protime: 16.4 s — ABNORMAL HIGH (ref 10.0–12.9)

## 2017-06-20 LAB — FUNGAL STAIN: Fungal Stain: 0

## 2017-06-20 LAB — HEMATOCRIT: Hematocrit: 20 % — ABNORMAL LOW (ref 34–45)

## 2017-06-20 LAB — TYPE AND SCREEN
ABO RH Blood Type: A NEG
Antibody Screen: NEGATIVE

## 2017-06-20 LAB — MAGNESIUM: Magnesium: 1.5 mg/dL — ABNORMAL LOW (ref 1.6–2.5)

## 2017-06-20 MED ORDER — GABAPENTIN 100 MG PO CAPSULE *I*
100.0000 mg | ORAL_CAPSULE | Freq: Three times a day (TID) | ORAL | Status: DC
Start: 2017-06-20 — End: 2017-06-20
  Filled 2017-06-20 (×3): qty 1

## 2017-06-20 MED ORDER — MAGNESIUM SULFATE 2 GM IN 50 ML *WRAPPED*
2000.0000 mg | INTRAVENOUS | Status: AC
Start: 2017-06-20 — End: 2017-06-20
  Administered 2017-06-20 (×2): 2000 mg via INTRAVENOUS
  Filled 2017-06-20 (×2): qty 50

## 2017-06-20 MED ORDER — SODIUM CHLORIDE 0.9 % IV SOLN WRAPPED *I*
3.0000 mL/h | Status: DC
Start: 2017-06-20 — End: 2017-07-04
  Administered 2017-06-23: 3 mL/h
  Administered 2017-06-23: 3 mL/h via INTRAVENOUS
  Administered 2017-06-23 – 2017-06-24 (×7): 3 mL/h
  Administered 2017-07-03: 3 mL/h via INTRAVENOUS

## 2017-06-20 MED ORDER — HYDROMORPHONE HCL 2 MG/ML IJ SOLN *WRAPPED*
1.0000 mg | Freq: Once | INTRAMUSCULAR | Status: AC
Start: 2017-06-20 — End: 2017-06-20
  Administered 2017-06-20: 1 mg via INTRAVENOUS
  Filled 2017-06-20: qty 1

## 2017-06-20 NOTE — Progress Notes (Addendum)
Surgical Oncology/Hepatobiliary Surgery Progress Note     LOS: 83 days     Subjective:  Interval Events:   IR placed drain in hepatic abscess and collection posterior to stomach yesterday.  Continues to complain of intense pain surrounding g tube site  WBC improved 10.8 (11.1)    Objective:  Vitals Sign Ranges for Past 24 Hours:  BP: (64-139)/(32-93)   Temp:  [36 C (96.8 F)-36.6 C (97.9 F)]   Temp src: Temporal (04/18 0815)  Heart Rate:  [95-115]   Resp:  [12-31]   SpO2:  [91 %-100 %]     Physical Exam:  General Appearance: Resting in bed, in NAD  Cardiac: regular rate  Respiratory: non-labored breathing on room air  Abdomen: Soft, non distended, G tube site c/d/i, tender surrounding tube. G to gravity with brownish output. Perc site in hepatic abscess with purulent material. Perc drain in abdominal collection with brownish output  Extremities: warm, erythema of the bilateral anterior calves with small amount of skin breakdown, minimal edema no posterior calf tenderness     Labs:    CBC:    Recent Labs  Lab 06/20/17  0200 06/20/17  0000 06/19/17  0020 06/18/17  0102 06/16/17  2332 06/16/17  0419 06/16/17  0218 06/15/17  0206   WBC  --  10.8* 11.1* 14.5* 19.0*  --  20.1* 14.6*   Hemoglobin  --  6.4* 6.9* 7.0* 6.8* 7.0* 7.0* 8.0*   Hematocrit 20* 21* 22* 23* 21* 22* 22* 26*   Platelets  --  530* 593* 558* 523*  --  569* 811*       Metabolic Panel:    Recent Labs  Lab 06/20/17  0000 06/19/17  0020 06/18/17  0102 06/16/17  2332 06/16/17  0218 06/15/17  0206   Sodium 132* 128* 129* 129* 130* 132*   Potassium 4.3 4.6 4.4 4.3 3.7 5.0   Chloride 94* 91* 92* 94* 95* 95*   CO2 '25 25 23 22 23 21   ' UN '7 10 10 9 7 8   ' Creatinine 0.51 0.50* 0.44* 0.46* 0.51 0.54   Glucose 103* 85 91 88 159* 101*   Calcium 8.2* 8.5* 8.1* 8.0* 7.8* 8.2*   Magnesium 1.5*  --   --  1.5*  --   --    Phosphorus 5.2*  --   --  3.4  --   --           Assessment:  72 y.o. female with h/o recent PE and pancreatic cancer POD # 83  status post whipple  procedure complicated by delayed gastric emptying.  NGT replaced on 04/04/17. UGI on 04/12/17 concerning for obstruction just beyond the [place] in the efferent limb.  Course complicated on 11/16/76 by rising LFTs and sepsis with CT concerning for cholangitis given biliary tree obstruction and PV compression due to hematoma and afferent limb distention in setting of PE treatment. Is s/p IR PTC on 04/16/17 and IR percutaneous aspiration of anterior abdominal fluid collection on 04/24/17. IR LUQ perc drain placement 3/1. IR anterior abdominal 55f perc drain placement 3/3. IR anterior perc drain into left hepatic collection 3/6.    Plan:  - NPO, IVF  - Pain control with PCA  - Continue IV antibiotic regimen per ID Recommendations.  - Possible UGI today  - Monitor perc drain output  - Anemia; stable.  - Aggressive bowel regimen  - SCDs, SQ heparin   - Dispo: Pending clinical course, resolution of acute infection.  Pt has Physical Debilitation requiring a rehab setting. PT/OT recommending SNF.    Cherylann Banas, MD  06/20/2017     10:42 AM   General Surgery Resident    HPB-GI Attending Addendum:   I personally examined the patient, reviewed the notes, and discussed the plan of care with the residents and patient. Agree with detailed resident's note. Please see the note above for details of history, exam, labs, assessment/plan which reflect my input.     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   IR drain placement in retrogastric collection yesterday. Appears to be well drained.   Attempt at UGI for evaluation of extent of bowel disruption.   Difficult decision making given recent complications from gastrostomy tube. Surgical exploration is contemplated but is not without problems. Location along gastrojejunostomy, anatomic considerations, and physical debility/ malnutrition all make surgical repair of the problem difficult. After long discussion with multiple surgical colleagues, it  is recommended we observe and support her through this issue.     Delano Metz, MD, FACS  Hepatobiliary, Pancreas & GI Surgery         .

## 2017-06-20 NOTE — Plan of Care (Signed)
Problem: Safety  Goal: Patient will remain free of falls  Outcome: Progressing towards goal      Problem: Pain/Comfort  Goal: Patient's pain or discomfort is manageable  Outcome: Progressing towards goal      Problem: Mobility  Goal: Functional status is maintained or improved - Geriatric  Outcome: Progressing towards goal      Problem: Nutrition  Goal: Patient's nutritional status is maintained or improved  Outcome: Goal not met  Pt NPO    Problem: Post-Operative Hemodynamic Stability  Goal: Maintain Hemodynamic Stability  Outcome: Maintaining      Problem: Psychosocial  Goal: Demonstrates ability to cope with illness  Outcome: Progressing towards goal      Problem: Fluid and Electrolyte Imbalance  Goal: Fluid and Electrolyte imbalance  Outcome: Progressing towards goal      Problem: GI Bleeding Elimination  Goal: Elimination of patterns are normal or improving  Outcome: Progressing towards goal

## 2017-06-20 NOTE — Consults (Signed)
Wound/Skin Care Rounds:  [id number]    Patient seen during unit skin rounds. B elbows nearly closed skin tears, pink intact dermis. Change Allevyn Gentle dressings weekly and PRN.      Recommendations:  BLE's - Cleanse with warm water/soap, pat dry and apply Aquaphor (Rx) daily please.  BUE skin tears- Cleanse with NS/gauze, and cover with Allevyn Gentle 3x3. Please change weekly and PRN.    Please consult with any questions or concerns.  Roselee Nova, RN, [employer] [phone]

## 2017-06-20 NOTE — Progress Notes (Signed)
Date perm completed and will forward to [facility]- PT not medically ready for discharge at this time. SW continues to follow and assist as needed.    [facility] reviewed pt and feel she is more LTC than STR. This was shared with daughter.  PT continues to work with her and recommend referral to 512 when medically stable.     Will continue to follow and as needed.

## 2017-06-20 NOTE — Progress Notes (Addendum)
Infectious Diseases (Team 3) Follow Up Note    Personally reviewed chart, labs, medications. Patient seen.     CC:  F/U VRE bacteremia, Liver and abdominal abscesses, s/p Enterobacter cloacae bacteremia, Intra-abdominal abscesses positive for VRE, enterobacter and candida glabrata    HPI:   72yo old female with pancreatic cancer, s/p Whipple procedure on 03/29/17 after neoadjuvant chemotherapy and radiation. Her post op course was c/b delayed gastric emptying, gastric distension and obstruction and a large surgical bed hematoma causing CBD and hepatic vein compression, severe biliary sepsis, s/p placement of a right hepatic duct external biliary drain placed on 2/12, found to have enterobacter cloacae bacteremia (2/12) presumed to be secondary to infection of the hematoma. However CT of abdomen (2/18) showed the hematoma had resolved but noted to have a well-circumscribed and larger anterior abdominal collection percutaneously, which was aspirated (2/19) and cultures grew enterobacter cloacae and Candida glabrata. ID service consulted on 2/21 with question of treating C.glabrata.  Caspofungin was started and a perc drain was placed LUQ 3/1, then anterior abdominal perc drain placed 3/3, and then anterior perc drain into left hepatic collection 05/08/17, the goal being source control with drain placements. She had been on Zosyn but her enterobacter became (amp-C inducible) thus switched to ertapenem and Daptomycin added on 05/04/17 for continued broad coverage.  Caspofungin was switched to oral high-dose fluconazole (74m/kg) daily once sensitivities returned on C.glabrata. Pt on methadone for pain control, QTc 409. Abdominal perc drains where removed once drainage was minimal as well as biliary tube was removed. However, there remained several communicating multiloculated fluid collections in LUQ and some areas of reaccumulation on 05/23/17 CT of a/p.   Summary of abdominal drain removals: Mid Abd on 3/17,  LUQ on 3/19,  Biliary Tube on 3/26, right lateral abd on 05/29/17.      She was switched over to oral therapy on 05/30/17 in hopes to ready her for SNF Rehab, ie, changed to Bactrim for enterobacter and to remain on high dose fluconazole for C. Glabrata, (no good option for VRE given DDI with Linezolid), with plan to reimage to fluid collections. She was initially stable on oral antibiotic regimen and ID signed off on 06/03/17.        ID Fellow team was re-consulted on 4/4 for with concern for early sepsis given patient now with rigor, lethargy, and tachycardia, though she was afebrile and no leukocytosis at that time.  A CT of her abdomen and pelvis was obtained as well as blood cultures (both on 06/06/17). CT demonstrated interval development of a new loculated 3 cm fluid collection in the right hepatic lobe, while the hepatic fluid collections remain stable in size. She also had chest CT which showed a moderate left pleural effusion (no change from prior). She as empirically restarted on ertapenem 1gm daily and daptomycin at 853mkg as she was on prior to oral regimen, and continued on fluconazole 80039maily.  She underwent a left thoracentesis on 4/5 with pigtail placement. The pleural fluid analysis c/w exudative and there were 1604 nucleated cells. No growth on cultures, pigtail removed on 06/09/17.  Blood cultures from 4/4, both periphery and Mediport grew VRE, and subsequently blood cultures on 4/6 and 4/8 were positive for VRE only Mediport.  Initially VRE bacteremia due to the pelvic abscesses, but now VRE seeded mediport which was removed on 06/10/17.   A PICC was placed on 06/13/17, once BldCx remained negative for 48 hours. SylShela Leffd a PEG placed 4/12 with  post procedure leukocytosis, this is now resolving. She has had persistent, severe pain around PEG site, palliative care and primary team have been addressing with pain medication adjustments and alternative modalities. A repeat CT of abdomen and pelvis from 4/15  demonstrated enlarging and new fluid collections c/w abscesses.  4/16: Daptomycin was increased to 10 mg/kg (rounded to 800 mg).  4/17: Patient underwent drain placements in hepatic and retrogastric fluid collections on 06/19/17.      Subjective: rates pain at PEG site 8/10, denies N/V/D/F/C.       ROS: Reviewed 6 systems in detail, see above    Current Meds:  Scheduled Meds:   heparin (porcine)  5,000 Units Subcutaneous TID    daptomycin IV  10 mg/kg Intravenous Q24H    methadone IV  2.5 mg Intravenous TID    sodium chloride  1 g Oral TID WC    ferrous sulfate  325 mg Oral Daily with breakfast    lactobacillus rhamnosus (GG)  1 capsule Oral Daily    fluconazole (DIFLUCAN) IV  800 mg Intravenous Q24H    ertapenem  1,000 mg Intravenous Q24H    methylphenidate  2.5 mg Oral BID    polyethylene glycol  17 g Per G Tube Daily    lidocaine  1 patch Transdermal Q24H    triamterene-hydrochlorothiazide  1 tablet Oral QAM    lidocaine  1 patch Transdermal Q24H    bisacodyl  10 mg Rectal Daily    DULoxetine  20 mg Oral Daily    docusate sodium  200 mg Oral 2 times per day    acetaminophen  1,000 mg Oral Q8H    levothyroxine  137 mcg Oral Daily     Continuous Infusions:   sodium chloride      dextrose 5 % and 0.9% NaCl 100 mL/hr (06/20/17 1723)    HYDROmorphone       PRN Meds:.   calcium carbonate  1,000 mg Oral BID PRN    HYDROmorphone PF  0.5 mg Intravenous Q1H PRN    Or    HYDROmorphone PF  1 mg Intravenous Q1H PRN    LORazepam  0.5 mg Oral Q6H PRN    heparin lock flush  50 Units Intracatheter PRN    ondansetron  4 mg Intravenous Q6H PRN       Objective:    BP: (101-123)/(49-82)   Temp:  [36 C (96.8 F)-36.7 C (98.1 F)]   Temp src: Temporal (04/18 1657)  Heart Rate:  [76-115]   Resp:  [12-23]   SpO2:  [91 %-99 %]     General Appearance: in bed, grimacing, hesitant to allow me to examine abdomen or move in the bed d/t abd pain at PEG site, hypophonia as baseline.   HEENT: Sclera anicteric, OP  clear  Pulm: anteriorly clear but diminished  CV: RRR, no M/R/G  Abdomen:  Distended, patient guarding when writer attempted to palpated gently, hypoactive bowel sounds, PEG tube site with mild erythema at insertion site, no drainage.    Extremities: MAE, WWP  Skin:Warm and dry to touch; no rashes  Neuro: alert and oriented   Lines/Drains/Tubes: 4/9 PIV R wrist, PICC LUE 4/11,  PEG (4/12), 2 abdominal drains (right and left) to gravity bags. Minimal brownish hazy fluid in left retrogastric drainage bad, scant greyish drainage in tubing from hepatic site, no drainage in bag at time of visit.       Recent Labs  Lab 06/20/17  1240 06/20/17  0200 06/20/17  0000 06/19/17  0020 06/18/17  0102   WBC  --   --  10.8* 11.1* 14.5*   Hemoglobin 8.0*  --  6.4* 6.9* 7.0*   Hematocrit 25* 20* 21* 22* 23*   Platelets  --   --  530* 593* 558*   Seg Neut %  --   --  76.1 75.0 79.8   Lymphocyte %  --   --  10.7 11.1 9.2   Monocyte %  --   --  9.2 9.4 6.8   Eosinophil %  --   --  2.1 3.2 2.9       Recent Labs  Lab 06/20/17  0000 06/19/17  0020 06/18/17  0102   Sodium 132* 128* 129*   Potassium 4.3 4.6 4.4   CO2 _0 UN _1 Creatinine 0.51 0.50* 0.44*   Glucose 103* 85 91   Calcium 8.2* 8.5* 8.1*         Lab results: 06/20/17  0000 06/16/17  2332 06/10/17  0106 06/07/17  0031 06/06/17  0114   Total Protein 5.7* 5.8* 6.3 6.7 6.8   Albumin 2.1* 2.1* 2.2* 2.3* 2.4*   ALT 45* 37* 57* 49* 47*   AST 65* 37* 91* 76* 69*   Alk Phos 496* 474* 407* 453* 489*   Bilirubin,Total <0.2 <0.2 <0.2 <0.2 <0.2       Lab Results  Component Value Date/Time   CRP 282 (H) 06/18/2017 0102   CRP 248 (H) 06/16/2017 0218   CRP 170 (H) 06/15/2017 0206       Lab Results  Component Value Date/Time   Sedimentation Rate 54 (H) 06/18/2017 0102   Sedimentation Rate 65 (H) 06/13/2017 1807   Sedimentation Rate 69 (H) 06/09/2017 0401         Lab results: 06/19/17  0020   CK 29     Micro:   1/25: Intraoperative bile: GS 0 pmns, no organisms, Cx: no growth  2/12:  1 set of blood cultures from the right Mediport (no peripheral bld was sent): positive for Enterobacter cloacae complex in 70 hrs, sensitive to zosyn.   2/18 abdominal GS had >25 PMNs, many GPC in pairs/chains, many GNB and cultures 4+ Enterobacter cloacae complex. The cultures also grew 1+ Candida glabrata   2/21: BCx from the Right peripheral IV, Mediport, L IJ:  No growth  05/03/17: IR-guided I&D with drain placement of the left upper quadrant collection: GS > 25 PMNs, many GNB and GN diplococci, Cultures with 4+ Enterobacter cloacae complex (resistant to Zosyn and CTX), C. Albicans and C. Glabrata, and Candida tropicalis  05/04/17:    1 set of blood cultures from left IJ: VRE at 15.3 TTP. Daptomycin sensitivities: MIC 24mg/ml (dose dependent),  Linezolid   1 set from periphery: VRE and Enterobacter cloacae at 14.8 TTP   05/05/17:  IR-guided I&D with drain placement of the intrahepatic collection: GS 1-10 PMNs, very few GPC in pairs. Fungal cultures with Candida glabrata  05/05/17: Blood cultures from periphery, Mediport and IJ: no growth  05/04/17: Urine cultures obtained (for unclear reason): no growth  05/08/17: Abscess GS: >25 PMNs, many GPC in chains, few GNB. Aerobic cultures growing 2+ Enterobacter (ertapenem, Cipro, TMP/SMX-sensitive) and 3+VRE, Fungal: C. Glabrata (sens to caspo, vori and dose dependent sens to fluconazole)  06/06/17: 2 sets of BCx (1 set each from periphery and Mediport) - taken after restarting ertapenem: IVAD grew VRE ttp 18.2 hours,  Periphery (Left  arm) grew VRE ttp 17.3.  06/07/17 Pleural fluid (left) GS 1-10 PMN's, 1-10 Nucleated white cells, No organisms seen and 1604 nucleated cells. No growth on culture.   06/07/17 Abdominal abscess: labs cancelled - no specimens received.   06/08/17: Mediport BC + for VRE in 23 hrs (R-Amp, R- tetracycline per lab, suppressed result, R- doxycycline;  S-Linezolid, S-Dapto dose dependent),  Right hand peripheral cx: No growth   06/10/17: Blood culture 2/2: 1 periphery  (RHand), 1 from Mediport before removed: no growth  06/11/17: Blood Culture 2/2 (both periphery) - no growth     06/19/17:  Perigastric fluid #1:  GS 10-25 PMNs -  GPB, GNB, GPC in pairs. Fungal and AFB stains negative; 2+ Enterococcus faecium (susceps  pending)   06/19/17:  Liver fluid collection:  GS: 0 PMNs, Fungal and AFB stains negative, Cx pending  06/19/17 : Perigastric fluid #2: GS: >25 PMNs, Gram negative bacilli; Fungal and AFB stains negative, Cx pending     Imaging/Other Relevant Diagnostics:   06/20/17 Abd AP/KUB: FINDINGS/IMPRESSION:   Upper abdomen and lower pelvis are not completely included on the field-of-view.   Free air cannot be excluded on this exam. Surgical clips are seen in the right abdomen.   Indwelling gastrostomy tube overlies the left abdomen. Indwelling pigtail catheter overlies the right lateral abdomen. There is also a possible pigtail catheter overlying the left mid abdomen, clinical correlation is recommended.   There is residual contrast in bowel loops, predominantly colonic in nature, amount of bowel contrast appears to have slightly decreased since the prior exam. Follow-up films would be useful. Paucity of small bowel gas limits evaluation of the small bowel loops.   There is mild levoscoliosis of the lumbar spine along with degenerative changes    Assessment and Plan:  ID problem(s):   - s/p enterobacter cloacae bacteremia  - Multiple and extensive Intra-abdominal abscesses growing VRE, enterobacter and candida glabrata  - VRE Bacteremia     72 y.o. female with h/o recent PE and pancreatic cancer, s/p whipple procedure (goal of cure), complicated by delayed gastric emptying, elevated LFTs and sepsis, mild malnutrition 2/2 early satiety and anorexia (requiring PEG), and multiple intraabdominal and hepatic abscesses.      See HPI above for detailed ID relevant history.    Karlye had drains placed in IR yesterday into the retrogastric and hepatic fluid collections, cultures are  pending.  She continues to c/o significant pain around PEG site which has relatively benign appearance, only mild erythema at insertion site.  She remains afebrile with white count trending down.  No change in antibiotic plan, will follow pending culture results.       Recommendations:   Continue Daptomycin to '10mg'$ /kg daily (round to 800 mg) for VRE coverage, IV Ertapenem 1 gram daily for enterobacter and anaerobe coverage, and IV or PO fluconazole 800 mg daily for C.glabrata coverage.   Follow weekly CK (while on Daptomycin)   Follow CBC with diff daily for now, reduce frequency once stabilizes   Follow BMP twice weekly, or more often as indicated.     Follow liver chemistries (while on antifungal), CRP, ESR weekly.    Follow 4/17 IR drainage culture results   ID team 3 will continue to follow along     Thank you for allowing [name] to participate in the care of this patient  Please call with questions/concerns.     Lonzo Candy, ANP-BC  Infectious Diseases, Team 3  Pager [phone]  Office Ph [phone])  [phone]

## 2017-06-21 ENCOUNTER — Inpatient Hospital Stay: Payer: Medicare (Managed Care)

## 2017-06-21 DIAGNOSIS — K255 Chronic or unspecified gastric ulcer with perforation: Secondary | ICD-10-CM

## 2017-06-21 LAB — POCT GLUCOSE
Glucose POCT: 115 mg/dL — ABNORMAL HIGH (ref 60–99)
Glucose POCT: 136 mg/dL — ABNORMAL HIGH (ref 60–99)
Glucose POCT: 139 mg/dL — ABNORMAL HIGH (ref 60–99)

## 2017-06-21 LAB — CBC AND DIFFERENTIAL
Baso # K/uL: 0.1 10*3/uL (ref 0.0–0.1)
Basophil %: 1 %
Eos # K/uL: 0.2 10*3/uL (ref 0.0–0.4)
Eosinophil %: 2.3 %
Hematocrit: 24 % — ABNORMAL LOW (ref 34–45)
Hemoglobin: 7.5 g/dL — ABNORMAL LOW (ref 11.2–15.7)
IMM Granulocytes #: 0.1 10*3/uL
IMM Granulocytes: 1.4 %
Lymph # K/uL: 1.2 10*3/uL (ref 1.2–3.7)
Lymphocyte %: 13.1 %
MCH: 29 pg/cell (ref 26–32)
MCHC: 32 g/dL (ref 32–36)
MCV: 89 fL (ref 79–95)
Mono # K/uL: 1 10*3/uL — ABNORMAL HIGH (ref 0.2–0.9)
Monocyte %: 11.3 %
Neut # K/uL: 6.5 10*3/uL — ABNORMAL HIGH (ref 1.6–6.1)
Nucl RBC # K/uL: 0 10*3/uL (ref 0.0–0.0)
Nucl RBC %: 0.1 /100 WBC (ref 0.0–0.2)
Platelets: 535 10*3/uL — ABNORMAL HIGH (ref 160–370)
RBC: 2.6 MIL/uL — ABNORMAL LOW (ref 3.9–5.2)
RDW: 17.1 % — ABNORMAL HIGH (ref 11.7–14.4)
Seg Neut %: 70.9 %
WBC: 9.1 10*3/uL (ref 4.0–10.0)

## 2017-06-21 LAB — RED BLOOD CELLS
Coded Blood type: 600
Component blood type: A NEG
Dispense status: TRANSFUSED

## 2017-06-21 LAB — BASIC METABOLIC PANEL
Anion Gap: 11 (ref 7–16)
CO2: 25 mmol/L (ref 20–28)
Calcium: 8 mg/dL — ABNORMAL LOW (ref 8.6–10.2)
Chloride: 99 mmol/L (ref 96–108)
Creatinine: 0.47 mg/dL — ABNORMAL LOW (ref 0.51–0.95)
GFR,Black: 115 *
GFR,Caucasian: 100 *
Glucose: 112 mg/dL — ABNORMAL HIGH (ref 60–99)
Lab: <4 mg/dL — ABNORMAL LOW (ref 6–20)
Potassium: 3.9 mmol/L (ref 3.3–5.1)
Sodium: 135 mmol/L (ref 133–145)

## 2017-06-21 LAB — AEROBIC CULTURE

## 2017-06-21 MED ORDER — GLUCOSE 40 % PO GEL *I*
15.0000 g | ORAL | Status: DC | PRN
Start: 2017-06-21 — End: 2017-07-12

## 2017-06-21 MED ORDER — LORAZEPAM 2 MG/ML IJ SOLN *I*
0.2500 mg | INTRAMUSCULAR | Status: DC
Start: 2017-06-21 — End: 2017-06-23
  Administered 2017-06-21 – 2017-06-22 (×3): 0.25 mg via INTRAVENOUS
  Filled 2017-06-21 (×3): qty 1

## 2017-06-21 MED ORDER — FAT EMULSION SOYBEAN OIL 20 % IV EMUL *WRAPPED*
54.0000 g | Freq: Every day | INTRAVENOUS | Status: AC
Start: 2017-06-21 — End: 2017-06-22
  Administered 2017-06-21: 54 g via INTRAVENOUS
  Filled 2017-06-21: qty 270

## 2017-06-21 MED ORDER — STERILE WATER FOR INJECTION (TPN USE ONLY) *I*
INTRAVENOUS | Status: AC
Start: 2017-06-21 — End: 2017-06-22
  Filled 2017-06-21: qty 556.8

## 2017-06-21 MED ORDER — DEXTROSE 50 % IV SOLN *I*
25.0000 g | INTRAVENOUS | Status: DC | PRN
Start: 2017-06-21 — End: 2017-07-12

## 2017-06-21 MED ORDER — GLUCAGON HCL (RDNA) 1 MG IJ SOLR *WRAPPED*
1.0000 mg | INTRAMUSCULAR | Status: DC | PRN
Start: 2017-06-21 — End: 2017-07-12

## 2017-06-21 MED ORDER — INSULIN LISPRO (HUMAN) 100 UNIT/ML IJ/SC SOLN *WRAPPED*
0.0000 [IU] | Freq: Four times a day (QID) | SUBCUTANEOUS | Status: DC
Start: 2017-06-21 — End: 2017-06-26
  Administered 2017-06-22 – 2017-06-23 (×2): 1 [IU] via SUBCUTANEOUS

## 2017-06-21 NOTE — Progress Notes (Signed)
Palliative Care Progress Note    HPI: [name] w/ PMH fibromyalgia, pancreatic CA s/p Whipple for 'cure' 03/29/17; s/p ICU w/ intub for resp failure & septic shock, weaned from pressors and extubated to HFNC 2/17 w/ IR drainage bili fluid collection 2/19, s/p IV abx, now all abd drains for abscesses removed. Palliative following for symptoms, started methadone 2/19, weaned dose 2/23 d/t concern for sedation. Pt w/ ongoing poor po intake, now tolerating bolus fds via dubhoff fdg tube. Unfortunately not able to get PEG w/o going to OR (anticipate 4/11), s/p R pleural pigtail for ~ 1 L drainage, now removed and + VRE from mediport, now removed 4/8. S/p PICC & PEG in IR on 4/12 w/ incr pain & FU CT abd and pelvis on 4/15 showing enlarging and new fluid collections c/w abscesses, possible disruption of gastrojejunostomy. S/p IR perc drain x 2 on 4/17 & conservative rx w/ bowel rest w/ TPN. Goal dispo SNF Rehab.    Subjective: "I just wonder how much my body can take. Ok, I'll keep going, I just want everyone to stop and listen when I tell them I'm in pain and I need more time. I want better pain control."    Objective: chart reviewed, visited w/ pt and her dtr Amanda Galloway late afternoon; Amanda Galloway was reporting severe abd pain, she had received ativan recently and was using her dilaudid PCA w/o much relief, pt asking for additional pain meds - dtr reported she had requested higher RN delivered 1 mg dilaudid prn; pt used total 14.1 mg on PCA over last 24 hrs and addit 5 mg of RN delivered dilaudid prn above PCA; advised that pt likely going through withdrawal from antidepressant w/ increased angst and anxiety compounding her pain; rec'd first line to increase dilaudid PCA dose from 0.4 to 0.6 mg and encouraged to ask for her low dose ativan prn - if she does ok w/ her increase in dilaudid PCA and using ativan prn freq w/o excessive sedation then would rec scheduling her ativan ATC tomorrow for better continuous symptom  management; dtr Amanda Galloway/HCP states that yesterday was the worst day thus far of pt's 72 week admission, states pt had severe uncontrolled pain and was ready to 'give up, asking would her young grandkids understand and not miss her too much'; Amanda Galloway advocating for better symptom control even if pt on sleepier side because she will not be ready for dispo to SNF now for some time while recovery for her latest set back    Palliative Care ROS:  Pain mod to severe tenderness around perc drains and PEG site, rates 10/10 (before RN gave addit dilaudid prn)  Nausea no  Dyspnea no  Weakness yes  Constipation better but still has contrast to pass  Delirum no  Last stool 4/19    Physical Examination:   BP: (100-130)/(54-78)   Temp:  [35.5 C (95.9 F)-36.3 C (97.3 F)]   Temp src: Temporal (04/19 1839)  Heart Rate:  [73-99]   Resp:  [16-18]   SpO2:  [91 %-97 %]   Gen appearance: [name], laying in bed, note facial grimacing, endorses abd pain around new perc drains and PEG tube, asking for more pain medicine even if it causes more sleepiness  Lungs: shallow guarded resp, dimin bases, NC 3 L  Heart: reg, on tele  Abd: large, tender around perc drains and PEG tube now draining yellow liquid, +BS, voids on own up to BR or commode  Extrem: mod  LE edema, reddened skin LEs  Skin: warm, dry  Neuro:  A+O x 3; looking distressed r/t pain and anxiety, feeling very weak and discouraged; at baseline ambulates short distances w/ walker & standby 1 assist     Assessment/Plan: [name] w/ pancreatic CA, s/p Whipple in Jan. W/ unfortunately long complicated hospital course, receiving bolus fds via dubhoff & long term IV abx for line related bacteremia, otherwise ready for dispo to SNF Rehab. S/p PEG placement in IR on 4/12 to facilitate dispo plan w/ unfortunate increased pain post & new abd CT findings of enlarged abscess, concern for perf warranting bowel rest. S/p IR perc drain x 2 on 4/17 w/ ongoing difficulty  controlling pain & anxiety. Pt receiving methadone to IV ATC and warrants increased dose dilaudid PCA now & consideration of ativan ATC for better symptom management.    Pain/dyspnea  Methadone 2.5 mg IV TID (hold for sedation, ie not arousable to sternal rub) iv methadone reduced 50% from po dose -ideally get acute pain controlled on PCA 1st then based on use can adjust up methadone dose  Increase Dilaudid PCA increase to 0.6 mg IV Q 20 min prn (equiv 1.8 mg/hr)  Dilaudid 1 mg IV Q 3 hrs prn (x 3 on 4/18, x 1 on 4/19) -change to 1 mg IV Q 2 hrs prn RN delivered for break through pain not relieved by PCA  Lidocaine patch daily x 2 (abd, LUE)  Encourage incentive spirometry prn     Anxiety/agitation/nausea/fatigue  Duloxetine 20 mg po daily --d/c now NPO on bowel rest, consider scheduling low dose ativan 0.25 mg IV Q 4 hrs ATC for anxiety   Ativan 0.25 mg IV Q 4 hrs prn (x 1 on 4/18, x 2 on 4/19)  Ondansetron 4 mg IV Q 6 hrs prn (last x 1 on 4/15)  Promethazine 12.5 mg IV Q 6 hrs prn (last x 1 on 4/15)  S/p introduction of aromatherapy personal inhaler "Javayah's blend" & "Stop, Think, Breathe" phone app for relaxation    Prevention of constipation, last stool 4/19  Colace, Miralax & Senna po-d/c now NPO on bowel rest  Bisacodyl supp pr daily (received x 1 on 4/19) -pt agreeable to taking, goal to avoid opioid induced constipation, has needed methylnaltrexone in past    Dry flaky edematous Skin  Bacitracin oint BID to BLEs  Eucerin prn for BLEs    GOC/prognosis  Full code  Dtr Amanda Galloway is HCP  Goal currently is to prioritize pt's symptom control while she is on bowel rest as dispo plans on hold though still eventually hopeful for SNF Rehab/LTC for a period in addition     Pt is tired and has indicated in past she's unsure how much more she can take; [place] discussion w/ Probation officer on Dr Ron Agee on 4/17 w/ pt indicating agreement to continue full treatment as long as reasonable hope that she will be able to make  recovery to independent living again    Pt case discussed w/ RN and covering provider. I've asked my PC colleagues to see pt over WE to check on pain/anxiety control.     Thank you for allowing [name] to participate in pt's care. We will continue to follow along, please call if questions/concerns.     Total Time Spent 40 minutes: >50% of time was spent in counseling and/or coordination of care.     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    Hypothyroidism E03.9    Pancreatic cancer C25.9    Anemia D64.9     hx of Pulmonary embolism I26.99    Depression F32.9    Sepsis A41.9       No Known Allergies (drug, envir, food or latex)    Scheduled Meds:    insulin lispro  0-20 Units Subcutaneous Q6H    LORazepam  0.25 mg Intravenous Q4H    heparin (porcine)  5,000 Units Subcutaneous TID    daptomycin IV  10 mg/kg Intravenous Q24H    methadone IV  2.5 mg Intravenous TID    sodium chloride  1 g Oral TID WC    ferrous sulfate  325 mg Oral Daily with breakfast    lactobacillus rhamnosus (GG)  1 capsule Oral Daily    fluconazole (DIFLUCAN) IV  800 mg Intravenous Q24H    ertapenem  1,000 mg Intravenous Q24H    methylphenidate  2.5 mg Oral BID    polyethylene glycol  17 g Per G Tube Daily    lidocaine  1 patch Transdermal Q24H    triamterene-hydrochlorothiazide  1 tablet Oral QAM    lidocaine  1 patch Transdermal Q24H    bisacodyl  10 mg Rectal Daily    DULoxetine  20 mg Oral Daily    docusate sodium  200 mg Oral 2 times per day    acetaminophen  1,000 mg Oral Q8H    levothyroxine  137 mcg Oral Daily       Continuous Infusions:    TPN (2 in 1) Adult Continuous 60 mL/hr at 06/21/17 1811    And    fat emulsion soybean oil 54 g (06/21/17 1813)     sodium chloride      HYDROmorphone         PRN Meds:  Nursing communication- Give 4 OZ of fruit juice for BG < 70 mg/dl **AND** dextrose **AND** dextrose **AND** glucagon **AND** POCT glucose, naloxone, HYDROmorphone PF, mineral oil-hydrophilic petrolatum, promethazine, sodium chloride, sodium chloride, sodium chloride, dextrose, sodium chloride, dextrose, calcium carbonate, ondansetron

## 2017-06-21 NOTE — Plan of Care (Signed)
Bowel Elimination     Elimination patterns are normal or improving Maintaining        Mobility     Patient's functional status is maintained or improved Maintaining        Nutrition     Nutritional status is maintained or improved - Geriatric Maintaining        Post-Operative Bowel Elimination     Elimination pattern is normal or improving Maintaining        Post-Operative Hemodynamic Stability     Maintain Hemodynamic Stability Maintaining

## 2017-06-21 NOTE — Progress Notes (Signed)
Report Given   Diagnostic Imaging Nurse Handoff:    Study:  Upper Gi with small bowel follow thru  A&O:  x3  Telemetry:  yes RN will travel  Precautions:  none  Transport:  bed w/hover    NPO:  yes  Tube Feeds:  No    Report Received From:  Normand Sloop, RN          Procedure Summary

## 2017-06-21 NOTE — Progress Notes (Signed)
Assumed care at 1900. VSS throughout shift. Complains of pain with any movement, using pain button appropriatley. Up to commode x3. Bili drains x2 with small amounts of bilious output. PEG with no output throughout shift. Tele NSR throughout shift. Will pass onto oncoming shift. Blase Mess, RN

## 2017-06-21 NOTE — Progress Notes (Addendum)
Surgical Oncology/Hepatobiliary Surgery Progress Note     LOS: 84 days     Subjective:  Interval Events:   No acute events overnight  WBC continues to improve. Hct stable  Pain surrounding G tube slightly improved    Objective:  Vitals Sign Ranges for Past 24 Hours:  BP: (100-124)/(54-82)   Temp:  [35.5 C (95.9 F)-36.7 C (98.1 F)]   Temp src: Temporal (04/19 0823)  Heart Rate:  [73-105]   Resp:  [12-18]   SpO2:  [91 %-97 %]     Physical Exam:  General Appearance: Resting in bed, in NAD  Cardiac: regular rate  Respiratory: non-labored breathing on room air  Abdomen: Soft, non distended, G tube site c/d/i, tender surrounding tube. G to gravity with brownish output. Perc site in hepatic abscess with bile tinged purulent material. Perc drain in abdominal collection with brownish output  Extremities: warm, erythema of the bilateral anterior calves with small amount of skin breakdown, minimal edema no posterior calf tenderness     Labs:    CBC:    Recent Labs  Lab 06/21/17  0102 06/20/17  1240 06/20/17  0200 06/20/17  0000 06/19/17  0020 06/18/17  0102 06/16/17  2332  06/16/17  0218   WBC 9.1  --   --  10.8* 11.1* 14.5* 19.0*  --  20.1*   Hemoglobin 7.5* 8.0*  --  6.4* 6.9* 7.0* 6.8*  < > 7.0*   Hematocrit 24* 25* 20* 21* 22* 23* 21*  < > 22*   Platelets 535*  --   --  530* 593* 558* 523*  --  569*   < > = values in this interval not displayed.    Metabolic Panel:    Recent Labs  Lab 06/21/17  0016 06/20/17  0000 06/19/17  0020 06/18/17  0102 06/16/17  2332 06/16/17  0218   Sodium 135 132* 128* 129* 129* 130*   Potassium 3.9 4.3 4.6 4.4 4.3 3.7   Chloride 99 94* 91* 92* 94* 95*   CO2 _0 UN <4* _1 Creatinine 0.47* 0.51 0.50* 0.44* 0.46* 0.51   Glucose 112* 103* 85 91 88 159*   Calcium 8.0* 8.2* 8.5* 8.1* 8.0* 7.8*   Magnesium  --  1.5*  --   --  1.5*  --    Phosphorus  --  5.2*  --   --  3.4  --           Assessment:  72 y.o. female with h/o recent PE and pancreatic cancer POD # 48  status  post whipple procedure complicated by delayed gastric emptying.  NGT replaced on 04/04/17. UGI on 04/12/17 concerning for obstruction just beyond the [place] in the efferent limb.  Course complicated on 10/13/01 by rising LFTs and sepsis with CT concerning for cholangitis given biliary tree obstruction and PV compression due to hematoma and afferent limb distention in setting of PE treatment. Is s/p IR PTC on 04/16/17 and IR percutaneous aspiration of anterior abdominal fluid collection on 04/24/17. IR LUQ perc drain placement 3/1. IR anterior abdominal 6f perc drain placement 3/3. IR anterior perc drain into left hepatic collection 3/6.    Plan:  - NPO, IVF  - TPN this evening  - Pain control with PCA  - Continue IV antibiotic regimen per ID Recommendations.  - Plan for tube study following by CT abdomen today  - Monitor perc drain output  -  Anemia; stable.  - Aggressive bowel regimen  - SCDs, SQ heparin   - Dispo: Pending clinical course, resolution of acute infection. Will eventually require SNF    Cherylann Banas, MD  06/21/2017     8:36 AM   General Surgery Resident      HPB-GI Attending Addendum:   I personally examined the patient, reviewed the notes, and discussed the plan of care with the residents and patient. Agree with detailed resident's note. Please see the note above for details of history, exam, labs, assessment/plan which reflect my input.     Seen and evaluated at 5pm, note delayed. Spoke with Daughter [name] who was present.      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   IR drain placement in retrogastric collection yesterday. Appears to be well drained.   UGI shows gastric injury with leak into contained collection.  Recent bacteremia with VRE.  Continue current antibiotics.   Hyperglycemia with  insulin sliding scale and recurrent need for insulin. continue   Post operative anemia. Stable. Transfused one unit. Stable. No bleeding present.  Pulmonary embolism. Present on admission. Heparin drip once stable for treatment. Holding for procedures.   Hypoalbuminemia. Sever protein calorie malnutrition.  TPN for now as we cannot use enteral feeding due to gastric leak.   Depression. Ritalin helpful. Daily encouragement and reinforcement of goals.   Constipation . Aggressive regimen.   Physical debility. Requires rehabilitation setting.   Pain control is difficult to contain due to high narcotic use and complaints all throughout her body. The latter likely due to fibromyalgia and depression. Increasing PCA. Continue on methadone.     I discussed options for treatment of gastric injury with [name] and her mother. It is felt that trial of healing from this injury would be better than attempt at surgical exploration due to location of injury and surgical anatomy. She is improving in some respects and has adequate drainage. She should be able to heal this limited gastric injury and not require complex gastrectomy, reconfiguration of the bowel and possible reconstruction of her post whipple anatomy.     Daughter and mother agree to continue with the current plan.        Delano Metz, MD, FACS  Hepatobiliary, Pancreas & GI Surgery

## 2017-06-21 NOTE — Consults (Signed)
Medical Nutrition Therapy - Follow Up    Admit Date: 03/29/2017    Patient Summary: 72 y.o. female with h/o recent PE and pancreatic cancer POD # 83  status post whipple procedure complicated by delayed gastric emptying.  NGT replaced on 04/04/17. UGI on 04/12/17 concerning for obstruction just beyond the [place] in the efferent limb.  Course complicated on 3/76/28 by rising LFTs and sepsis with CT concerning for cholangitis given biliary tree obstruction and PV compression due to hematoma and afferent limb distention in setting of PE treatment. Is s/p IR PTC on 04/16/17 and IR percutaneous aspiration of anterior abdominal fluid collection on 04/24/17. IR LUQ perc drain placement 3/1. IR anterior abdominal 33f perc drain placement 3/3. IR anterior perc drain into left hepatic collection 3/6.    Pertinent Meds: levothyroxine, duloxetine, bowel regimen including daily suppository, maxzide, Ritalin, ferrous sulfate, culturelle, Diflucan, ertapenem, NaCl tablets, Cubicin, methadone, dilaudid, D5 NS @ 100 ml/hr, Mg sulfate  Pertinent Labs: Ca 8.0 (L); PO4 5.2 (H) on 4/18, Mg 1.5 (L) 4/18      Reviewed I/O's: 55 ml drain output thus far today, 1650 ml UOP    Enteral or parenteral access: PICC, PEG     Food allergies: NKFA    Current diet: NPO (4/15)   Supplements: none     Nutrition Focused Physical Exam:    Edema: +1 generalized, ,+1 abdominal, +2 RLE   Abdomen: firm, rounded, surgical scar, tenderness/guarding, loss of appetite, hypoactive BS, -flatus   Skin: blanchable redness to sacrum, abrasion to L elbow, skin tear to R elbow, skin tear to back     Anthropometrics:  Height: 170.2 cm ('5\' 7"' )    Current Weight: 76.9 kg (169 lb 8 oz) (4/16)  Weight with minimal edema 64.9; 90% IBW; 90% UBW  Ideal Body Weight: 72.4 kg + 10% adjusted for age   BMI: 22.4 kg/(m^2) slightly underweight for age (ideal BMI 23-27 for age 5+)   Weight Hx: weight gain likely fluid related     06/18/2017 76.885 kg 169 lbs 8 oz Actual +1/+2 edema   05/13/2017  64.9 kg 143 lbs 1 oz Trace edema          Estimated Nutrient Needs: (Based on 64.9 kg) *BW with minimal edema                 1630-1950 kcal/day (25-30 kcal/kg)   78-87 g protein/day (1.2-1.5 g/kg)    1630-1950 mL fluid/day (25-30 mL/kg)      Nutrition Assessment and Diagnosis:   Now s/p drain placement into retrogastric and hepatic fluid collections. Has been complaining about pain in G-tube site, concern for bowel injury and possible disruption of gastrojejunostomy. LFTs trending ALT 45, AST 65, a phos 496 (H). TFs d/c'd 4/15, were providing TFs provide 1620 kcal, 68 g protein and 1061 ml free water daily. Pt with minimal PO intake and was meeting majority of energy needs via enteral feeds. Attempted to meet with pt, leaving unit for CT.     Pt has been on TPN previously during admission, has had low Na in the past. Noted edema, will provide lower end of estimated fluid needs. High phos noted yesterday, recommend to re-check. Pt has been without enteral nutrition for 3 days, however  IV fluids provide 120 g dextrose, 408 kcal. Can start at day 2 dextrose.     TPN Initiation  The dietitian and Clinical Nutrition Specialist agrees TPN is appropriate for this patient.  Original Start Date: 4/19  D/C Plan: TPN will be discontinued once enteral feeds can be safely restarted and pt tolerates feeds/oral diet     Malnutrition Status: Pt diagnosed with moderate malnutrition 04/09/17     Nutrition Intervention:   Please check and replete K+/Phos/Mg++ to WNL with IV replacements prior to starting TPN.   Continue to monitor K+/Phos/Mg++ daily as TPN is advanced to goal. Replete outside of TPN, as needed.  Once central access is obtained and electrolytes are WNL, recommend TPN (2 in 1) Adult Continuous plus Fat (central infusion site) with dextrose advanced to goal over 2 days:    Day 1: Rate: 60 mL/hr     Volume (Amino Acid/Dextrose solution): 1440 mL/day     Amino acids: 58 g/L      Dextrose: 131 g/L      Standard  additives:            Balance Chloride:Acetate     Insulin: 0 units/LITER    Fat Emulsion 20% infusion: 54 gm/day (to be infused over 12 hours)   Day 2: If BGs/electrolytes are acceptable, advance dextrose to 187 g/L (goal)  -TPN, at goal, will provide 1790 kcal/day, 84 g protein/day, 269 g dextrose/day and 1710 mL total fluid/day (including amino acid/dextrose solution and lipid volume).   4.   Please adjust or D/C IVF once TPN is initiated (TPN will provide for baseline fluid needs). Continue to monitor fluid balance and provide non-dextrose containing IVF to replace any extraordinary losses.  5.   Please order TPN Monitoring Panel.   Includes: PO4, Mg, BMP (basic metabolic panel) daily for 3 days or until stable,  then 3 times per week.   CMP (comprehensive metabolic panel), Triglycerides at baseline and weekly.    BG every 6 hours until stable on goal dextrose.   Weight at baseline, then weekly.      Nutrition Monitoring/Evaluation:     1. Will monitor TF tolerance, nutrition-related labs, weight trend, BM pattern.   2. Will follow up per high nutrition risk protocol.    Laurence Aly, [place]  Pager [phone]

## 2017-06-21 NOTE — Progress Notes (Signed)
Assumed Care 0700-1900: Pt started on TPN. Pt started on PCA w/ Cont's w/ positive effect. Oncoming shift notified and aware. Will continue to monitor and assess.     Sharyon Medicus, RN

## 2017-06-21 NOTE — Progress Notes (Signed)
Report Given   Handoff received from [place] on unit [facility]. Aware of patient coming to [facility] for CT.

## 2017-06-22 LAB — CBC AND DIFFERENTIAL
Baso # K/uL: 0.1 10*3/uL (ref 0.0–0.1)
Basophil %: 0.9 %
Eos # K/uL: 0.3 10*3/uL (ref 0.0–0.4)
Eosinophil %: 3.6 %
Hematocrit: 25 % — ABNORMAL LOW (ref 34–45)
Hemoglobin: 7.6 g/dL — ABNORMAL LOW (ref 11.2–15.7)
IMM Granulocytes #: 0.1 10*3/uL
IMM Granulocytes: 1.5 %
Lymph # K/uL: 1.2 10*3/uL (ref 1.2–3.7)
Lymphocyte %: 13.8 %
MCH: 28 pg/cell (ref 26–32)
MCHC: 31 g/dL — ABNORMAL LOW (ref 32–36)
MCV: 91 fL (ref 79–95)
Mono # K/uL: 0.8 10*3/uL (ref 0.2–0.9)
Monocyte %: 9.1 %
Neut # K/uL: 6.3 10*3/uL — ABNORMAL HIGH (ref 1.6–6.1)
Nucl RBC # K/uL: 0 10*3/uL (ref 0.0–0.0)
Nucl RBC %: 0 /100 WBC (ref 0.0–0.2)
Platelets: 502 10*3/uL — ABNORMAL HIGH (ref 160–370)
RBC: 2.7 MIL/uL — ABNORMAL LOW (ref 3.9–5.2)
RDW: 16.9 % — ABNORMAL HIGH (ref 11.7–14.4)
Seg Neut %: 71.1 %
WBC: 8.8 10*3/uL (ref 4.0–10.0)

## 2017-06-22 LAB — HEPATIC FUNCTION PANEL
ALT: 91 U/L — ABNORMAL HIGH (ref 0–35)
AST: 144 U/L — ABNORMAL HIGH (ref 0–35)
Albumin: 2 g/dL — ABNORMAL LOW (ref 3.5–5.2)
Alk Phos: 594 U/L — ABNORMAL HIGH (ref 35–105)
Bilirubin,Direct: <0.2 mg/dL (ref 0.0–0.3)
Bilirubin,Total: <0.2 mg/dL (ref 0.0–1.2)
Total Protein: 5.4 g/dL — ABNORMAL LOW (ref 6.3–7.7)

## 2017-06-22 LAB — AEROBIC CULTURE: Aerobic Culture: 0

## 2017-06-22 LAB — BASIC METABOLIC PANEL
Anion Gap: 9 (ref 7–16)
CO2: 26 mmol/L (ref 20–28)
Calcium: 8.1 mg/dL — ABNORMAL LOW (ref 8.6–10.2)
Chloride: 100 mmol/L (ref 96–108)
Creatinine: 0.48 mg/dL — ABNORMAL LOW (ref 0.51–0.95)
GFR,Black: 114 *
GFR,Caucasian: 99 *
Glucose: 129 mg/dL — ABNORMAL HIGH (ref 60–99)
Lab: 4 mg/dL — ABNORMAL LOW (ref 6–20)
Potassium: 3.6 mmol/L (ref 3.3–5.1)
Sodium: 135 mmol/L (ref 133–145)

## 2017-06-22 LAB — POCT GLUCOSE
Glucose POCT: 142 mg/dL — ABNORMAL HIGH (ref 60–99)
Glucose POCT: 128 mg/dL — ABNORMAL HIGH (ref 60–99)
Glucose POCT: 131 mg/dL — ABNORMAL HIGH (ref 60–99)

## 2017-06-22 MED ORDER — CHLORPROMAZINE HCL 25 MG/ML IJ SOLN *WRAPPED*
25.0000 mg | Freq: Three times a day (TID) | INTRAMUSCULAR | Status: DC
Start: 2017-06-22 — End: 2017-06-22
  Filled 2017-06-22 (×4): qty 1

## 2017-06-22 MED ORDER — CHLORPROMAZINE HCL 25 MG/ML IJ SOLN *WRAPPED*
25.0000 mg | Freq: Three times a day (TID) | INTRAMUSCULAR | Status: DC
Start: 2017-06-22 — End: 2017-06-22

## 2017-06-22 MED ORDER — CHLORPROMAZINE HCL 25 MG/ML IJ SOLN *WRAPPED*
25.0000 mg | Freq: Three times a day (TID) | INTRAMUSCULAR | Status: DC | PRN
Start: 2017-06-22 — End: 2017-06-27
  Administered 2017-06-22 – 2017-06-25 (×2): 25 mg via INTRAVENOUS
  Filled 2017-06-22 (×5): qty 1

## 2017-06-22 MED ORDER — STERILE WATER FOR INJECTION (TPN USE ONLY) *I*
INTRAVENOUS | Status: AC
Start: 2017-06-22 — End: 2017-06-23
  Filled 2017-06-22: qty 556.8

## 2017-06-22 MED ORDER — FAT EMULSION SOYBEAN OIL 20 % IV EMUL *WRAPPED*
54.0000 g | Freq: Every day | INTRAVENOUS | Status: AC
Start: 2017-06-22 — End: 2017-06-23
  Administered 2017-06-22: 54 g via INTRAVENOUS
  Filled 2017-06-22: qty 270

## 2017-06-22 MED ORDER — CHLORPROMAZINE HCL 10 MG PO TABS *I*
10.0000 mg | ORAL_TABLET | Freq: Once | ORAL | Status: DC
Start: 2017-06-22 — End: 2017-06-22

## 2017-06-22 MED ORDER — HYDROMORPHONE HCL 2 MG/ML IJ SOLN *WRAPPED*
1.0000 mg | INTRAMUSCULAR | Status: DC | PRN
Start: 2017-06-22 — End: 2017-07-17
  Administered 2017-06-28 – 2017-07-14 (×11): 1 mg via INTRAVENOUS
  Filled 2017-06-22 (×11): qty 1

## 2017-06-22 NOTE — Progress Notes (Addendum)
Surgical Oncology/Hepatobiliary Surgery Progress Note     LOS: 85 days     Subjective:  Interval Events:   No acute events overnight  CT yesterday shows evidence of gastric perforation controlled by drain    Objective:  Vitals Sign Ranges for Past 24 Hours:  BP: (107-130)/(56-78)   Temp:  [35.5 C (95.9 F)-36.4 C (97.5 F)]   Temp src: Temporal (04/20 0357)  Heart Rate:  [73-99]   Resp:  [16-20]   SpO2:  [91 %-94 %]     Physical Exam:  General Appearance: Resting in bed, in NAD  Cardiac: regular rate  Respiratory: non-labored breathing on room air  Abdomen: Soft, non distended, G tube site c/d/i, tender surrounding tube. Perc site in hepatic abscess with bile tinged purulent material. Perc drain in abdominal collection with brownish output  Extremities: warm, erythema of the bilateral anterior calves with small amount of skin breakdown, minimal edema no posterior calf tenderness     Labs:    CBC:    Recent Labs  Lab 06/21/17  2356 06/21/17  0102 06/20/17  1240 06/20/17  0200 06/20/17  0000 06/19/17  0020 06/18/17  0102 06/16/17  2332   WBC 8.8 9.1  --   --  10.8* 11.1* 14.5* 19.0*   Hemoglobin 7.6* 7.5* 8.0*  --  6.4* 6.9* 7.0* 6.8*   Hematocrit 25* 24* 25* 20* 21* 22* 23* 21*   Platelets 502* 535*  --   --  530* 593* 558* 161*       Metabolic Panel:    Recent Labs  Lab 06/21/17  2356 06/21/17  0016 06/20/17  0000 06/19/17  0020 06/18/17  0102 06/16/17  2332   Sodium 135 135 132* 128* 129* 129*   Potassium 3.6 3.9 4.3 4.6 4.4 4.3   Chloride 100 99 94* 91* 92* 94*   CO2 '26 25 25 25 23 22   ' UN 4* <4* '7 10 10 9   ' Creatinine 0.48* 0.47* 0.51 0.50* 0.44* 0.46*   Glucose 129* 112* 103* 85 91 88   Calcium 8.1* 8.0* 8.2* 8.5* 8.1* 8.0*   Magnesium  --   --  1.5*  --   --  1.5*   Phosphorus  --   --  5.2*  --   --  3.4          Assessment:  72 y.o. female with h/o recent PE and pancreatic cancer POD # 85  status post whipple procedure complicated by delayed gastric emptying.  NGT replaced on 04/04/17. UGI on 04/12/17  concerning for obstruction just beyond the [place] in the efferent limb.  Course complicated on 0/96/04 by rising LFTs and sepsis with CT concerning for cholangitis given biliary tree obstruction and PV compression due to hematoma and afferent limb distention in setting of PE treatment. Is s/p IR PTC on 04/16/17 and IR percutaneous aspiration of anterior abdominal fluid collection on 04/24/17. IR LUQ perc drain placement 3/1. IR anterior abdominal 41f perc drain placement 3/3. IR anterior perc drain into left hepatic collection 3/6.    Plan:  - NPO, IVF  - TPN  - Pain control with PCA  - Continue IV antibiotic regimen per ID Recommendations.  - Monitor perc drain output  - Anemia; stable.  - Aggressive bowel regimen  - SCDs, SQ heparin   - Dispo: Pending clinical course, resolution of acute infection and gastric perforation. Will eventually require SNF    CCherylann Banas MD  06/22/2017  7:41 AM   General Surgery Resident    HPB-GI Attending Addendum:   I personally examined the patient, reviewed the notes, and discussed the plan of care with the residents and patient. Agree with detailed resident's note. Please see the note above for details of history, exam, labs, assessment/plan which reflect my input.       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   IR drain placement in retrogastric collection yesterday. Appears to be well drained.   UGI shows gastric injury with leak into contained collection.  Recent bacteremia with VRE.  Continue current antibiotics.   Hyperglycemia with insulin sliding scale and recurrent need for insulin. continue   Post operative anemia. Stable. Transfused one unit. Stable. No bleeding present.  Pulmonary embolism. Present on admission. Heparin drip once stable for treatment. Holding for  procedures.   Hypoalbuminemia. Sever protein calorie malnutrition.  TPN for now as we cannot use enteral feeding due to gastric leak.   Depression. Ritalin helpful. Daily encouragement and reinforcement of goals.   Constipation . Aggressive regimen.   Physical debility. Requires rehabilitation setting.   Pain control. Increase PCA.     Gastrostomy tube to gravity. Thorazine for hiccups. May require nasogastric tube but will hold for now.     Delano Metz, MD, FACS  Hepatobiliary, Pancreas & GI Surgery

## 2017-06-22 NOTE — Progress Notes (Signed)
Palliative Care Progress Note    Subjective:   [name] w/ PMH fibromyalgia, pancreatic CA s/p Whipple for 'cure' 03/29/17; s/p ICU w/ intub for resp failure & septic shock, weaned from pressors and extubated to HFNC 2/17 w/ IR drainage bili fluid collection 2/19, s/p IV abx, now all abd drains for abscesses removed. Palliative following for symptoms, started methadone 2/19, weaned dose 2/23 d/t concern for sedation. Pt w/ ongoing poor po intake, now tolerating bolus fds via dubhoff fdg tube. Unfortunately not able to get PEG w/o going to OR (anticipate 4/11), s/p R pleural pigtail for ~ 1 L drainage, now removed and + VRE from mediport, now removed 4/8. S/p PICC & PEG in IR on 4/12 w/ incr pain & FU CT abd and pelvis on 4/15 showing enlarging and new fluid collections c/w abscesses, possible disruption of gastrojejunostomy. S/p IR perc drain x 2 on 4/17 & conservative rx w/ bowel rest w/ TPN. Goal dispo SNF Rehab.    Our service was consulted yesterday regarding pain management.  Her PCA hydromorphone dose was titrated up to 0.6mg  (lock out Q20 minutes and basal rate of 0.2mg /H not changed).  Today she reports the higher PCA dose has been helpful.  She has better pain relief and has reduced the frequency with which she was demanding PCA doses.        Physical Examination:   BP: (100-132)/(58-78)   Temp:  [35.7 C (96.3 F)-36.6 C (97.8 F)]   Temp src: Temporal (04/20 1315)  Heart Rate:  [92-99]   Resp:  [16-24]   SpO2:  [91 %-94 %]   Ill appearing female laying in bed.  NAD.  Speaks in a low voice and responds to questions slowly.       Assessment/Plan:    Assessment/Plan: [name] w/ pancreatic CA, s/p Whipple in Jan. W/ unfortunately long complicated hospital course, receiving bolus fds via dubhoff & long term IV abx for line related bacteremia, otherwise ready for dispo to SNF Rehab. S/p PEG placement in IR on 4/12 to facilitate dispo plan w/ unfortunate increased pain post & new abd CT findings of enlarged  abscess, concern for perf warranting bowel rest. S/p IR perc drain x 2 on 4/17 w/ ongoing difficulty controlling pain & anxiety. Pt receiving methadone to IV ATC and warrants increased dose dilaudid PCA now & consideration of ativan ATC for better symptom management.    Pain/dyspnea  Methadone 2.5 mg IV TID (hold for sedation) iv methadone reduced 50% from po dose -ideally get acute pain controlled on PCA 1st then based on use can adjust up methadone dose  Dilaudid PCA dose increased to 0.6 mg IV Q 20 min prn yesterday with good result.  Monitor for sedation.    Dilaudid 1 mg IV Q 3 hrs prn (x 3 on 4/19, one dose today) - consider change to 1 mg IV Q 2 hrs prn RN delivered for break through pain not relieved by PCA  Lidocaine patch daily x 2 (abd, LUE)  Encourage incentive spirometry prn     Anxiety/agitation/nausea/fatigue  Duloxetine 20 mg po daily --d/c now NPO on bowel rest,  Ativan 0.25 mg IV Q 4 hrs (x 3 on 4/1, x 1 so far today)  Ondansetron 4 mg IV Q 6 hrs prn (last x 1 on 4/15)  Promethazine 12.5 mg IV Q 6 hrs prn (last x 1 on 4/15)  S/p introduction of aromatherapy personal inhaler "Krisi's blend" & "Stop, Think, Breathe" phone  app for relaxation    Prevention of constipation, last stool 4/19  Colace, Miralax & Senna po-d/c now NPO on bowel rest  Bisacodyl supp pr daily (received x 1 on 4/19) -pt agreeable to taking, goal to avoid opioid induced constipation, has needed methylnaltrexone in past    Dry flaky edematous Skin  Bacitracin oint BID to BLEs  Eucerin prn for BLEs    GOC/prognosis  Full code  Dtr Rod Mae is HCP  Goal currently is to prioritize pt's symptom control while she is on bowel rest as dispo plans on hold though still eventually hopeful for SNF Rehab/LTC for a period in addition     Pt is tired and has indicated in past she's unsure how much more she can take; [place] discussion w/ Probation officer on Dr Ron Agee on 4/17 w/ pt indicating agreement to continue full treatment as long as  reasonable hope that she will be able to make recovery to independent living again                      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    Hypothyroidism E03.9    Pancreatic cancer C25.9    Anemia D64.9     hx of Pulmonary embolism I26.99    Depression F32.9    Sepsis A41.9       No Known Allergies (drug, envir, food or latex)    Scheduled Meds:    chlorproMAZINE  25 mg Intramuscular TID    insulin lispro  0-20 Units Subcutaneous Q6H    LORazepam  0.25 mg Intravenous Q4H    heparin (porcine)  5,000 Units Subcutaneous TID    daptomycin IV  10 mg/kg Intravenous Q24H    methadone IV  2.5 mg Intravenous TID    sodium chloride  1 g Oral TID WC    ferrous sulfate  325 mg Oral Daily with breakfast    lactobacillus rhamnosus (GG)  1 capsule Oral Daily    fluconazole (DIFLUCAN) IV  800 mg Intravenous Q24H    ertapenem  1,000 mg Intravenous Q24H    methylphenidate  2.5 mg Oral BID    polyethylene glycol  17 g Per G Tube Daily    lidocaine  1 patch Transdermal Q24H    triamterene-hydrochlorothiazide  1 tablet Oral QAM    lidocaine  1 patch Transdermal Q24H    bisacodyl  10 mg Rectal Daily    DULoxetine  20 mg Oral Daily    docusate sodium  200 mg Oral 2 times per day    acetaminophen  1,000 mg Oral Q8H    levothyroxine  137 mcg Oral Daily       Continuous Infusions:    TPN (2 in 1) Adult Continuous      And    fat emulsion soybean oil      TPN (2 in 1) Adult Continuous 60 mL/hr at 06/22/17 0004    sodium chloride      HYDROmorphone         PRN Meds:  HYDROmorphone PF, Nursing communication- Give 4 OZ of fruit juice for BG < 70 mg/dl **AND** dextrose **AND** dextrose **AND** glucagon **AND** POCT glucose, naloxone, mineral oil-hydrophilic petrolatum, promethazine, sodium chloride,  sodium chloride, sodium chloride, dextrose, sodium chloride, dextrose, calcium carbonate, ondansetron

## 2017-06-22 NOTE — Consults (Signed)
Medical Nutrition Therapy Brief Note:    Pt started TPN for nutrition support. Recommend to check phosphorus, if PO4 remains elevated, recommend 6 mmol//L PO4.     Laurence Aly, [place]  Pager [phone]

## 2017-06-23 LAB — CBC AND DIFFERENTIAL
Baso # K/uL: 0.1 10*3/uL (ref 0.0–0.1)
Basophil %: 0.8 %
Eos # K/uL: 0.4 10*3/uL (ref 0.0–0.4)
Eosinophil %: 4.4 %
Hematocrit: 26 % — ABNORMAL LOW (ref 34–45)
Hemoglobin: 7.9 g/dL — ABNORMAL LOW (ref 11.2–15.7)
IMM Granulocytes #: 0.1 10*3/uL
IMM Granulocytes: 1.5 %
Lymph # K/uL: 1.2 10*3/uL (ref 1.2–3.7)
Lymphocyte %: 13 %
MCH: 28 pg/cell (ref 26–32)
MCHC: 31 g/dL — ABNORMAL LOW (ref 32–36)
MCV: 90 fL (ref 79–95)
Mono # K/uL: 0.8 10*3/uL (ref 0.2–0.9)
Monocyte %: 8.9 %
Neut # K/uL: 6.6 10*3/uL — ABNORMAL HIGH (ref 1.6–6.1)
Nucl RBC # K/uL: 0 10*3/uL (ref 0.0–0.0)
Nucl RBC %: 0 /100 WBC (ref 0.0–0.2)
Platelets: 508 10*3/uL — ABNORMAL HIGH (ref 160–370)
RBC: 2.9 MIL/uL — ABNORMAL LOW (ref 3.9–5.2)
RDW: 17.2 % — ABNORMAL HIGH (ref 11.7–14.4)
Seg Neut %: 71.4 %
WBC: 9.3 10*3/uL (ref 4.0–10.0)

## 2017-06-23 LAB — POCT GLUCOSE
Glucose POCT: 134 mg/dL — ABNORMAL HIGH (ref 60–99)
Glucose POCT: 139 mg/dL — ABNORMAL HIGH (ref 60–99)
Glucose POCT: 142 mg/dL — ABNORMAL HIGH (ref 60–99)
Glucose POCT: 150 mg/dL — ABNORMAL HIGH (ref 60–99)

## 2017-06-23 LAB — MAGNESIUM: Magnesium: 1.5 mg/dL — ABNORMAL LOW (ref 1.6–2.5)

## 2017-06-23 LAB — BASIC METABOLIC PANEL
Anion Gap: 10 (ref 7–16)
CO2: 27 mmol/L (ref 20–28)
Calcium: 8.1 mg/dL — ABNORMAL LOW (ref 8.6–10.2)
Chloride: 96 mmol/L (ref 96–108)
Creatinine: 0.46 mg/dL — ABNORMAL LOW (ref 0.51–0.95)
GFR,Black: 116 *
GFR,Caucasian: 100 *
Glucose: 139 mg/dL — ABNORMAL HIGH (ref 60–99)
Lab: 10 mg/dL (ref 6–20)
Potassium: 3.9 mmol/L (ref 3.3–5.1)
Sodium: 133 mmol/L (ref 133–145)

## 2017-06-23 LAB — ANAEROBIC CULTURE: Anaerobic Culture: 0

## 2017-06-23 LAB — PHOSPHORUS: Phosphorus: 4.1 mg/dL (ref 2.7–4.5)

## 2017-06-23 MED ORDER — ALTEPLASE 2 MG IJ SOLR *I*
0.5000 mg | Freq: Once | INTRAMUSCULAR | Status: AC
Start: 2017-06-23 — End: 2017-06-23
  Administered 2017-06-23: 2 mg
  Filled 2017-06-23: qty 2

## 2017-06-23 MED ORDER — FAT EMULSION SOYBEAN OIL 20 % IV EMUL *WRAPPED*
54.0000 g | Freq: Every day | INTRAVENOUS | Status: AC
Start: 2017-06-23 — End: 2017-06-24
  Administered 2017-06-23: 54 g via INTRAVENOUS
  Filled 2017-06-23: qty 270

## 2017-06-23 MED ORDER — LORAZEPAM 2 MG/ML IJ SOLN *I*
0.2500 mg | Freq: Four times a day (QID) | INTRAMUSCULAR | Status: DC
Start: 2017-06-23 — End: 2017-06-24

## 2017-06-23 MED ORDER — MAGNESIUM SULFATE 2 GM IN 50 ML *WRAPPED*
2000.0000 mg | Freq: Once | INTRAVENOUS | Status: AC
Start: 2017-06-23 — End: 2017-06-23
  Administered 2017-06-23: 2000 mg via INTRAVENOUS
  Filled 2017-06-23: qty 50

## 2017-06-23 MED ORDER — STERILE WATER FOR INJECTION (TPN USE ONLY) *I*
INTRAVENOUS | Status: AC
Start: 2017-06-23 — End: 2017-06-24
  Filled 2017-06-23: qty 556.8

## 2017-06-23 NOTE — Progress Notes (Signed)
Assumed Care 0700-1900: Pt demonstrated lethargy and fatigue throughout shift. Pt's daughter was upset that pain regimen had been altered and wanted to speak to a provider to find a solution to adequately manage her pain. Provider Synetta Fail was notified and arrived to bedside and changed the orders for the PCA. Oncoming shift notified and aware. Will continue to monitor and assess.     Sharyon Medicus, RN

## 2017-06-23 NOTE — Progress Notes (Addendum)
Surgical Oncology/Hepatobiliary Surgery Progress Note     LOS: 86 days     Subjective:  Interval Events:   No acute events overnight. Pt remains afebrile.  Pt continues to be NPO with mIVF and TPN for nutritional support. Perc drains put out total 80cc of purulent output. Pain is well-controlled on current medication regimen.    Objective:  Vitals Sign Ranges for Past 24 Hours:  BP: (100-132)/(59-80)   Temp:  [35.6 C (96.1 F)-36.6 C (97.8 F)]   Temp src: Temporal (04/21 0413)  Heart Rate:  [90-112]   Resp:  [14-24]   SpO2:  [90 %-94 %]     Physical Exam:  General Appearance: Resting in bed, in NAD  Cardiac: regular rate  Respiratory: non-labored breathing on room air  Abdomen: Soft, non distended, G tube site c/d/i, tender surrounding tube. Perc site in hepatic abscess with bile tinged purulent material. Perc drain in abdominal collection with brownish output  Extremities: warm, erythema of the bilateral anterior calves with small amount of skin breakdown, minimal edema no posterior calf tenderness     Labs:    CBC:    Recent Labs  Lab 06/23/17  0428 06/21/17  2356 06/21/17  0102 06/20/17  1240 06/20/17  0200 06/20/17  0000 06/19/17  0020 06/18/17  0102   WBC 9.3 8.8 9.1  --   --  10.8* 11.1* 14.5*   Hemoglobin 7.9* 7.6* 7.5* 8.0*  --  6.4* 6.9* 7.0*   Hematocrit 26* 25* 24* 25* 20* 21* 22* 23*   Platelets 508* 502* 535*  --   --  530* 593* 010*       Metabolic Panel:    Recent Labs  Lab 06/23/17  0428 06/21/17  2356 06/21/17  0016 06/20/17  0000 06/19/17  0020 06/18/17  0102 06/16/17  2332   Sodium 133 135 135 132* 128* 129* 129*   Potassium 3.9 3.6 3.9 4.3 4.6 4.4 4.3   Chloride 96 100 99 94* 91* 92* 94*   CO2 '27 26 25 25 25 23 22   ' UN 10 4* <4* '7 10 10 9   ' Creatinine 0.46* 0.48* 0.47* 0.51 0.50* 0.44* 0.46*   Glucose 139* 129* 112* 103* 85 91 88   Calcium 8.1* 8.1* 8.0* 8.2* 8.5* 8.1* 8.0*   Magnesium 1.5*  --   --  1.5*  --   --  1.5*   Phosphorus 4.1  --   --  5.2*  --   --  3.4          Assessment:  72  y.o. female with h/o recent PE and pancreatic cancer POD # 62  status post whipple procedure complicated by delayed gastric emptying.  NGT replaced on 04/04/17. UGI on 04/12/17 concerning for obstruction just beyond the [place] in the efferent limb.  Course complicated on 2/72/53 by rising LFTs and sepsis with CT concerning for cholangitis given biliary tree obstruction and PV compression due to hematoma and afferent limb distention in setting of PE treatment. Is s/p IR PTC on 04/16/17 and IR percutaneous aspiration of anterior abdominal fluid collection on 04/24/17. IR LUQ perc drain placement 3/1. IR anterior abdominal 87f perc drain placement 3/3. IR anterior perc drain into left hepatic collection 3/6.    Plan:  - NPO, IVF. Continue TPN for nutritional support.  - Pain control with PCA  - Continue IV antibiotic regimen per ID Recommendations.  - Continue to monitor perc drain output  - Anemia; stable.  -  Aggressive bowel regimen  - SCDs, SQ heparin   - Dispo: Pending clinical course, resolution of acute infection and gastric perforation. Will eventually require SNF    Bishop Dublin, MD  06/23/2017     6:13 AM   General Surgery Resident    Surgical Oncology/Hepatobiliary Surgery Progress Note     LOS: 86 days     Subjective:  Interval Events:   No acute events overnight  CT yesterday shows evidence of gastric perforation controlled by drain    Objective:  Vitals Sign Ranges for Past 24 Hours:  BP: (118-138)/(66-90)   Temp:  [35.6 C (96.1 F)-36.4 C (97.5 F)]   Temp src: Temporal (04/21 2200)  Heart Rate:  [88-107]   Resp:  [14-25]   SpO2:  [92 %-96 %]     Physical Exam:  General Appearance: Resting in bed, in NAD  Cardiac: regular rate  Respiratory: non-labored breathing on room air  Abdomen: Soft, non distended, G tube site c/d/i, tender surrounding tube. Perc site in hepatic abscess with bile tinged purulent material. Perc drain in abdominal collection with brownish output  Extremities: warm, erythema of the  bilateral anterior calves with small amount of skin breakdown, minimal edema no posterior calf tenderness     Labs:    CBC:    Recent Labs  Lab 06/23/17  0428 06/21/17  2356 06/21/17  0102 06/20/17  1240 06/20/17  0200 06/20/17  0000 06/19/17  0020 06/18/17  0102   WBC 9.3 8.8 9.1  --   --  10.8* 11.1* 14.5*   Hemoglobin 7.9* 7.6* 7.5* 8.0*  --  6.4* 6.9* 7.0*   Hematocrit 26* 25* 24* 25* 20* 21* 22* 23*   Platelets 508* 502* 535*  --   --  530* 593* 458*       Metabolic Panel:    Recent Labs  Lab 06/23/17  0428 06/21/17  2356 06/21/17  0016 06/20/17  0000 06/19/17  0020 06/18/17  0102 06/16/17  2332   Sodium 133 135 135 132* 128* 129* 129*   Potassium 3.9 3.6 3.9 4.3 4.6 4.4 4.3   Chloride 96 100 99 94* 91* 92* 94*   CO2 '27 26 25 25 25 23 22   ' UN 10 4* <4* '7 10 10 9   ' Creatinine 0.46* 0.48* 0.47* 0.51 0.50* 0.44* 0.46*   Glucose 139* 129* 112* 103* 85 91 88   Calcium 8.1* 8.1* 8.0* 8.2* 8.5* 8.1* 8.0*   Magnesium 1.5*  --   --  1.5*  --   --  1.5*   Phosphorus 4.1  --   --  5.2*  --   --  3.4          Assessment:  72 y.o. female with h/o recent PE and pancreatic cancer POD # 40  status post whipple procedure complicated by delayed gastric emptying.  NGT replaced on 04/04/17. UGI on 04/12/17 concerning for obstruction just beyond the [place] in the efferent limb.  Course complicated on 0/99/83 by rising LFTs and sepsis with CT concerning for cholangitis given biliary tree obstruction and PV compression due to hematoma and afferent limb distention in setting of PE treatment. Is s/p IR PTC on 04/16/17 and IR percutaneous aspiration of anterior abdominal fluid collection on 04/24/17. IR LUQ perc drain placement 3/1. IR anterior abdominal 69f perc drain placement 3/3. IR anterior perc drain into left hepatic collection 3/6.    Plan:  - NPO, IVF  - TPN  - Pain  control with PCA  - Continue IV antibiotic regimen per ID Recommendations.  - Monitor perc drain output  - Anemia; stable.  - Aggressive bowel regimen  - SCDs, SQ heparin    - Dispo: Pending clinical course, resolution of acute infection and gastric perforation. Will eventually require SNF    Delano Metz, MD  06/23/2017     10:36 PM   General Surgery Resident    [facility]-GI Attending Addendum:   I personally examined the patient, reviewed the notes, and discussed the plan of care with the residents and patient. Agree with detailed resident's note. Please see the note above for details of history, exam, labs, assessment/plan which reflect my input.       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   IR drain placement in retrogastric collection yesterday. Appears to be well drained.   UGI shows gastric injury with leak into contained collection.  Recent bacteremia with VRE.  Continue current antibiotics.   Hyperglycemia with insulin sliding scale and recurrent need for insulin. continue   Post operative anemia. Stable. Transfused one unit. Stable. No bleeding present.  Pulmonary embolism. Present on admission. Heparin drip once stable for treatment. Holding for procedures.   Hypoalbuminemia. Sever protein calorie malnutrition.  TPN for now as we cannot use enteral feeding due to gastric leak.   Depression. Ritalin helpful. Daily encouragement and reinforcement of goals.   Constipation . Aggressive regimen.   Physical debility. Requires rehabilitation setting.   Pain control. Increase PCA.     Gastrostomy tube to gravity.hiccups resolved.  May require nasogastric tube but will hold for now.     Delano Metz, MD, FACS  Hepatobiliary, Pancreas & GI Surgery

## 2017-06-23 NOTE — Progress Notes (Signed)
Palliative Care Progress Note    Subjective: [name] w/ PMH fibromyalgia, pancreatic CA s/p Whipple for 'cure' 03/29/17; s/p ICU w/ intub for resp failure &septic shock, weaned from pressors and extubated to [facility] 2/17 w/ IR drainage bili fluid collection 2/19, s/p IV abx, now all abd drains for abscesses removed. Palliative following for symptoms, started methadone 2/19, weaned dose 2/23 d/t concern for sedation. Pt w/ ongoing poor po intake, now tolerating bolus fds via dubhoff fdg tube. Unfortunately not able to get PEG w/o going to OR (anticipate 4/11), s/p R pleural pigtail for ~ 1 L drainage, now removed and + VRE from mediport, now removed 4/8. S/p PICC &PEG in IR on 4/12 w/ incr pain &FU CT abd and pelvis on 4/15 showing enlarging and new fluid collections c/w abscesses, possible disruption of gastrojejunostomy. S/p IRperc drain x 2 on 4/17 &conservative rx w/bowel rest w/ TPN. Goal dispo SNF Rehab.    Our service was consulted on Friday regarding pain management.  Her PCA hydromorphone dose was titrated up to 0.6mg  (lock out Q20 minutes and basal rate of 0.2mg /H not changed).  The patient has been sedated for the past few nursing shifts.  She was sleeping when I entered the room today.  She was easily aroused but does not stay awake spontaneously.  She described her pain levels have not changed despite the recent changes in analgesic doses.    Palliative Care ROS:  Pain   Severe          Physical Examination:   BP: (120-138)/(66-90)   Temp:  [35.6 C (96.1 F)-36.4 C (97.5 F)]   Temp src: Temporal (04/21 1210)  Heart Rate:  [89-112]   Resp:  [14-25]   SpO2:  [90 %-94 %]   Ptient oversedated.  Easily aroused but quickly falls to sleep when not stimulated.      Assessment/Plan:    Assessment/Plan: [name] w/ pancreatic CA, s/p Whipple in Jan. W/ unfortunately long complicated hospital course, receiving bolus fds via dubhoff & long term IV abx for line related bacteremia, otherwise ready for dispo to  SNF Rehab. S/p PEG placement in IR on 4/12 to facilitate dispo plan w/ unfortunate increased pain post&new abd CT findings of enlarged abscess, concern for perf warranting bowel rest. S/p IRperc drain x 2 on 4/17 w/ ongoing difficulty controlling pain &anxiety. Pt receiving methadone to IV ATC and warrants increased dosedilaudid PCA now &consideration of ativan ATC for better symptom management.    Pain/dyspnea  Sould hold methadone doses until she is more alert.  May need to reconsider this as her long acting opioid analgesic.  In the short term, it should be held   Reduce Dilaudid PCA dose to0.4mg  IV Q 20 min prn.  Discontinue basal infusion.    Lidocaine patch daily x 2 (abd, LUE)  Encourage incentive spirometry prn     Anxiety/agitation/nausea/fatigue  Duloxetine 20 mg po daily --d/c now NPO on bowel rest,  Ativan 0.25 mg IV Q 4 hrs (x 2 on 4/20, x none today). Should hold for sedation.  Change dosing interval to Q6H.    Ondansetron 4 mg IV Q 6 hrs prn (last x 1 on 4/15)  Promethazine 12.5 mg IV Q 6 hrs prn (last x 1 on 4/15)      Prevention of constipation, last stool 4/19  Colace, Miralax & Senna po-d/c now NPO on bowel rest  Bisacodyl supp pr daily (received x 1 on 4/19) -pt agreeable to  taking, goal to avoid opioid induced constipation, has needed methylnaltrexone in past    Dry flaky edematous Skin  Bacitracin oint BID to BLEs  Eucerin prn for BLEs    GOC/prognosis  Full code  Dtr Rod Mae is HCP    GOC were discussed with the patient and Dr. Ron Agee on 4/17   Patient indicated a desire to continue full treatment as long as reasonable hope that she will be able to make recovery to independent living again.  This will need to be revisited with the patient if her condition does not improve over time.    Goal currently is to prioritize pt's symptom control while she is on bowel rest.  Unfortunately, she is experiencing high degrees of sedation which is not consistent with her current goal  of optimizing her recovery and rehab potential.                                          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    Hypothyroidism E03.9    Pancreatic cancer C25.9    Anemia D64.9     hx of Pulmonary embolism I26.99    Depression F32.9    Sepsis A41.9       No Known Allergies (drug, envir, food or latex)    Scheduled Meds:    insulin lispro  0-20 Units Subcutaneous Q6H    LORazepam  0.25 mg Intravenous Q4H    heparin (porcine)  5,000 Units Subcutaneous TID    daptomycin IV  10 mg/kg Intravenous Q24H    methadone IV  2.5 mg Intravenous TID    sodium chloride  1 g Oral TID WC    ferrous sulfate  325 mg Oral Daily with breakfast    lactobacillus rhamnosus (GG)  1 capsule Oral Daily    fluconazole (DIFLUCAN) IV  800 mg Intravenous Q24H    ertapenem  1,000 mg Intravenous Q24H    methylphenidate  2.5 mg Oral BID    polyethylene glycol  17 g Per G Tube Daily    lidocaine  1 patch Transdermal Q24H    triamterene-hydrochlorothiazide  1 tablet Oral QAM    lidocaine  1 patch Transdermal Q24H    bisacodyl  10 mg Rectal Daily    DULoxetine  20 mg Oral Daily    docusate sodium  200 mg Oral 2 times per day    acetaminophen  1,000 mg Oral Q8H    levothyroxine  137 mcg Oral Daily       Continuous Infusions:    TPN (2 in 1) Adult Continuous      And    fat emulsion soybean oil      TPN (2 in 1) Adult Continuous 60 mL/hr at 06/23/17 0635    sodium chloride      HYDROmorphone         PRN Meds:  HYDROmorphone PF, chlorproMAZINE, Nursing communication- Give 4 OZ of fruit juice for BG < 70 mg/dl **AND** dextrose **AND** dextrose **AND** glucagon **AND** POCT glucose, naloxone, mineral oil-hydrophilic petrolatum, promethazine, sodium chloride, sodium chloride, sodium chloride, dextrose,  sodium chloride, dextrose, calcium carbonate, ondansetron

## 2017-06-24 LAB — EKG 12-LEAD
P: 66 deg
P: 57 deg
PR: 180 ms
PR: 160 ms
QRS: 41 deg
QRS: 45 deg
QRSD: 72 ms
QRSD: 68 ms
QT: 324 ms
QT: 328 ms
QTc: 431 ms
QTc: 448 ms
Rate: 106 {beats}/min
Rate: 112 {beats}/min
Severity: BORDERLINE
Statement: BORDERLINE
T: 41 deg
T: 55 deg

## 2017-06-24 LAB — CBC AND DIFFERENTIAL
Baso # K/uL: 0.1 10*3/uL (ref 0.0–0.1)
Basophil %: 0.7 %
Eos # K/uL: 0.5 10*3/uL — ABNORMAL HIGH (ref 0.0–0.4)
Eosinophil %: 5.8 %
Hematocrit: 26 % — ABNORMAL LOW (ref 34–45)
Hemoglobin: 8 g/dL — ABNORMAL LOW (ref 11.2–15.7)
IMM Granulocytes #: 0.1 10*3/uL
IMM Granulocytes: 1.1 %
Lymph # K/uL: 1.1 10*3/uL — ABNORMAL LOW (ref 1.2–3.7)
Lymphocyte %: 12.7 %
MCH: 28 pg/cell (ref 26–32)
MCHC: 31 g/dL — ABNORMAL LOW (ref 32–36)
MCV: 90 fL (ref 79–95)
Mono # K/uL: 0.8 10*3/uL (ref 0.2–0.9)
Monocyte %: 9.6 %
Neut # K/uL: 6 10*3/uL (ref 1.6–6.1)
Nucl RBC # K/uL: 0 10*3/uL (ref 0.0–0.0)
Nucl RBC %: 0 /100 WBC (ref 0.0–0.2)
Platelets: 512 10*3/uL — ABNORMAL HIGH (ref 160–370)
RBC: 2.8 MIL/uL — ABNORMAL LOW (ref 3.9–5.2)
RDW: 16.9 % — ABNORMAL HIGH (ref 11.7–14.4)
Seg Neut %: 70.1 %
WBC: 8.6 10*3/uL (ref 4.0–10.0)

## 2017-06-24 LAB — HEPATIC FUNCTION PANEL
ALT: 69 U/L — ABNORMAL HIGH (ref 0–35)
AST: 87 U/L — ABNORMAL HIGH (ref 0–35)
Albumin: 2.2 g/dL — ABNORMAL LOW (ref 3.5–5.2)
Alk Phos: 540 U/L — ABNORMAL HIGH (ref 35–105)
Bilirubin,Direct: <0.2 mg/dL (ref 0.0–0.3)
Bilirubin,Total: <0.2 mg/dL (ref 0.0–1.2)
Total Protein: 5.7 g/dL — ABNORMAL LOW (ref 6.3–7.7)

## 2017-06-24 LAB — POCT GLUCOSE
Glucose POCT: 101 mg/dL — ABNORMAL HIGH (ref 60–99)
Glucose POCT: 110 mg/dL — ABNORMAL HIGH (ref 60–99)
Glucose POCT: 124 mg/dL — ABNORMAL HIGH (ref 60–99)
Glucose POCT: 137 mg/dL — ABNORMAL HIGH (ref 60–99)
Glucose POCT: 39 mg/dL — CL (ref 60–99)
Glucose POCT: <20 mg/dL — CL (ref 60–99)

## 2017-06-24 LAB — BASIC METABOLIC PANEL
Anion Gap: 12 (ref 7–16)
CO2: 26 mmol/L (ref 20–28)
Calcium: 8.1 mg/dL — ABNORMAL LOW (ref 8.6–10.2)
Chloride: 95 mmol/L — ABNORMAL LOW (ref 96–108)
Creatinine: 0.41 mg/dL — ABNORMAL LOW (ref 0.51–0.95)
GFR,Black: 120 *
GFR,Caucasian: 104 *
Glucose: 113 mg/dL — ABNORMAL HIGH (ref 60–99)
Lab: 11 mg/dL (ref 6–20)
Potassium: 3.9 mmol/L (ref 3.3–5.1)
Sodium: 133 mmol/L (ref 133–145)

## 2017-06-24 LAB — PROTIME-INR
INR: 1.2 — ABNORMAL HIGH (ref 0.9–1.1)
Protime: 13.9 s — ABNORMAL HIGH (ref 10.0–12.9)

## 2017-06-24 LAB — MAGNESIUM: Magnesium: 1.9 mg/dL (ref 1.6–2.5)

## 2017-06-24 LAB — TRIGLYCERIDES: Triglycerides: 231 mg/dL — AB

## 2017-06-24 LAB — PHOSPHORUS: Phosphorus: 4.1 mg/dL (ref 2.7–4.5)

## 2017-06-24 LAB — ANAEROBIC CULTURE
Anaerobic Culture: 0
Anaerobic Culture: 0

## 2017-06-24 LAB — RED BLOOD CELLS
Coded Blood type: 600
Component blood type: A NEG

## 2017-06-24 LAB — PREALBUMIN: Prealbumin: 9 mg/dL — ABNORMAL LOW (ref 20–40)

## 2017-06-24 LAB — AEROBIC CULTURE

## 2017-06-24 MED ORDER — STERILE WATER FOR INJECTION (TPN USE ONLY) *I*
INTRAVENOUS | Status: AC
Start: 2017-06-24 — End: 2017-06-25
  Filled 2017-06-24: qty 556.8

## 2017-06-24 MED ORDER — FAT EMULSION SOYBEAN OIL 20 % IV EMUL *WRAPPED*
54.0000 g | Freq: Every day | INTRAVENOUS | Status: AC
Start: 2017-06-24 — End: 2017-06-25
  Administered 2017-06-24: 54 g via INTRAVENOUS
  Filled 2017-06-24: qty 270

## 2017-06-24 MED ORDER — STERILE WATER FOR INJECTION (TPN USE ONLY) *I*
INTRAVENOUS | Status: DC
Start: 2017-06-24 — End: 2017-06-24

## 2017-06-24 MED ORDER — FAT EMULSION SOYBEAN OIL 20 % IV EMUL *WRAPPED*
54.0000 g | Freq: Every day | INTRAVENOUS | Status: DC
Start: 2017-06-24 — End: 2017-06-24

## 2017-06-24 MED ORDER — LORAZEPAM 2 MG/ML IJ SOLN *I*
0.2500 mg | Freq: Four times a day (QID) | INTRAMUSCULAR | Status: DC | PRN
Start: 2017-06-24 — End: 2017-07-21
  Administered 2017-06-25 – 2017-07-19 (×40): 0.25 mg via INTRAVENOUS
  Filled 2017-06-24 (×41): qty 1

## 2017-06-24 NOTE — Progress Notes (Signed)
Assumed care of pt from 0700-1900. VSS and A&OX3. Writer holding methadone and ativan at this time, d/t pt being lethargic yesterday. TPN still running, PEG tube to gravity. Perc drains flushed q8 hrs.Pt resting in bed comfortably. Will continue to monitor.  Loreli Dollar, RN

## 2017-06-24 NOTE — Progress Notes (Signed)
Infectious Diseases (Team 3) Follow Up Note    Personally reviewed chart, labs, medications. Patient seen.     CC:  F/U VRE bacteremia, Liver and abdominal abscesses, s/p Enterobacter cloacae bacteremia, Intra-abdominal abscesses positive for VRE, enterobacter and candida glabrata    HPI:   72yo old female with pancreatic cancer, s/p Whipple procedure on 03/29/17 after neoadjuvant chemotherapy and radiation. Her post op course was c/b delayed gastric emptying, gastric distension and obstruction and a large surgical bed hematoma causing CBD and hepatic vein compression, severe biliary sepsis, s/p placement of a right hepatic duct external biliary drain placed on 2/12, found to have enterobacter cloacae bacteremia (2/12) presumed to be secondary to infection of the hematoma. However CT of abdomen (2/18) showed the hematoma had resolved but noted to have a well-circumscribed and larger anterior abdominal collection percutaneously, which was aspirated (2/19) and cultures grew enterobacter cloacae and Candida glabrata. ID service consulted on 2/21 with question of treating C.glabrata.  Caspofungin was started and a perc drain was placed LUQ 3/1, then anterior abdominal perc drain placed 3/3, and then anterior perc drain into left hepatic collection 05/08/17, the goal being source control with drain placements. She had been on Zosyn but her enterobacter became (amp-C inducible) thus switched to ertapenem and Daptomycin added on 05/04/17 for continued broad coverage.  Caspofungin was switched to oral high-dose fluconazole (74m/kg) daily once sensitivities returned on C.glabrata. Pt on methadone for pain control, QTc 409. Abdominal perc drains where removed once drainage was minimal as well as biliary tube was removed. However, there remained several communicating multiloculated fluid collections in LUQ and some areas of reaccumulation on 05/23/17 CT of a/p.   Summary of abdominal drain removals: Mid Abd on 3/17,  LUQ on 3/19,  Biliary Tube on 3/26, right lateral abd on 05/29/17.      She was switched over to oral therapy on 05/30/17 in hopes to ready her for SNF Rehab, ie, changed to Bactrim for enterobacter and to remain on high dose fluconazole for C. Glabrata, (no good option for VRE given DDI with Linezolid), with plan to reimage to fluid collections. She was initially stable on oral antibiotic regimen and ID signed off on 06/03/17.        ID Fellow team was re-consulted on 4/4 for with concern for early sepsis given patient now with rigor, lethargy, and tachycardia, though she was afebrile and no leukocytosis at that time.  A CT of her abdomen and pelvis was obtained as well as blood cultures (both on 06/06/17). CT demonstrated interval development of a new loculated 3 cm fluid collection in the right hepatic lobe, while the hepatic fluid collections remain stable in size. She also had chest CT which showed a moderate left pleural effusion (no change from prior). She as empirically restarted on ertapenem 1gm daily and daptomycin at 853mkg as she was on prior to oral regimen, and continued on fluconazole 80039maily.  She underwent a left thoracentesis on 4/5 with pigtail placement. The pleural fluid analysis c/w exudative and there were 1604 nucleated cells. No growth on cultures, pigtail removed on 06/09/17.  Blood cultures from 4/4, both periphery and Mediport grew VRE, and subsequently blood cultures on 4/6 and 4/8 were positive for VRE only Mediport.  Initially VRE bacteremia due to the pelvic abscesses, but now VRE seeded mediport which was removed on 06/10/17.   A PICC was placed on 06/13/17, once BldCx remained negative for 48 hours. SylShela Leffd a PEG placed 4/12 with  post procedure leukocytosis, this is now resolving. She has had persistent, severe pain around PEG site, palliative care and primary team have been addressing with pain medication adjustments and alternative modalities. A repeat CT of abdomen and pelvis from 4/15  demonstrated enlarging and new fluid collections c/w abscesses.  4/16: Daptomycin was increased to 10 mg/kg (rounded to 800 mg).  4/17: Patient underwent drain placements in hepatic and retrogastric fluid collections on 06/19/17.  06/21/17: CT of abdomen/pelvis showed gastric perforation and interval drainage of both perigastric and liver abscesses (drains remain in place). Patient is NPO, on TPN.         Subjective: smiling today, states abdomen and as well as "all over" pain has lessened on current pain regimen, rating at about 1/10, denies N/V/D/F/C.       ROS: Reviewed 6 systems in detail, see above    Current Meds:  Scheduled Meds:   LORazepam  0.25 mg Intravenous Q6H    insulin lispro  0-20 Units Subcutaneous Q6H    heparin (porcine)  5,000 Units Subcutaneous TID    daptomycin IV  10 mg/kg Intravenous Q24H    methadone IV  2.5 mg Intravenous TID    sodium chloride  1 g Oral TID WC    ferrous sulfate  325 mg Oral Daily with breakfast    lactobacillus rhamnosus (GG)  1 capsule Oral Daily    fluconazole (DIFLUCAN) IV  800 mg Intravenous Q24H    ertapenem  1,000 mg Intravenous Q24H    methylphenidate  2.5 mg Oral BID    polyethylene glycol  17 g Per G Tube Daily    lidocaine  1 patch Transdermal Q24H    triamterene-hydrochlorothiazide  1 tablet Oral QAM    lidocaine  1 patch Transdermal Q24H    bisacodyl  10 mg Rectal Daily    DULoxetine  20 mg Oral Daily    docusate sodium  200 mg Oral 2 times per day    acetaminophen  1,000 mg Oral Q8H    levothyroxine  137 mcg Oral Daily     Continuous Infusions:   TPN (2 in 1) Adult Continuous      And    fat emulsion soybean oil      TPN (2 in 1) Adult Continuous 60 mL/hr at 06/24/17 7062    sodium chloride 3 mL/hr (06/24/17 0621)    HYDROmorphone       PRN Meds:.   calcium carbonate  1,000 mg Oral BID PRN    HYDROmorphone PF  0.5 mg Intravenous Q1H PRN    Or    HYDROmorphone PF  1 mg Intravenous Q1H PRN    LORazepam  0.5 mg Oral Q6H PRN    heparin  lock flush  50 Units Intracatheter PRN    ondansetron  4 mg Intravenous Q6H PRN       Objective:    BP: (100-130)/(66-84)   Temp:  [35.5 C (95.9 F)-36.4 C (97.5 F)]   Temp src: Temporal (04/22 1400)  Heart Rate:  [83-97]   Resp:  [16-23]   SpO2:  [91 %-96 %]     General Appearance: in bed, watching television, smiling, NAD, daughter at bedside.    HEENT: Sclera anicteric, OP clear, MM dry  Pulm: diminished bases, few fine crackles right base   CV: RRR, no M/R/G  Abdomen:  Distended, soft, still quiet TTP, +bowel sounds, PEG tube (to drainage) and right and left abdominal drains present.      Extremities:  MAE, WWP  Skin:Warm and dry to touch; no rashes  Neuro: alert and oriented   Lines/Drains/Tubes: 4/9 PIV R wrist, PICC LUE 4/11,  PEG (4/12) to drainage bag (light yellow gastric drainage), 2 abdominal drains (right and left) to gravity bags. Sm amt brownish hazy fluid in left retrogastric drainage bag, sm amt green hazy from hepatic drain to bag, insertions sites of PEG and drains benign.       Recent Labs  Lab 06/24/17  0021 06/23/17  0428 06/21/17  2356   WBC 8.6 9.3 8.8   Hemoglobin 8.0* 7.9* 7.6*   Hematocrit 26* 26* 25*   Platelets 512* 508* 502*   Seg Neut % 70.1 71.4 71.1   Lymphocyte % 12.7 13.0 13.8   Monocyte % 9.6 8.9 9.1   Eosinophil % 5.8 4.4 3.6       Recent Labs  Lab 06/24/17  0021 06/23/17  0428 06/21/17  2356   Sodium 133 133 135   Potassium 3.9 3.9 3.6   CO2 '26 27 26   '$ UN 11 10 4*   Creatinine 0.41* 0.46* 0.48*   Glucose 113* 139* 129*   Calcium 8.1* 8.1* 8.1*         Lab results: 06/24/17  0021 06/21/17  2356 06/20/17  0000 06/16/17  2332 06/10/17  0106   Total Protein 5.7* 5.4* 5.7* 5.8* 6.3   Albumin 2.2* 2.0* 2.1* 2.1* 2.2*   ALT 69* 91* 45* 37* 57*   AST 87* 144* 65* 37* 91*   Alk Phos 540* 594* 496* 474* 407*   Bilirubin,Total <0.2 <0.2 <0.2 <0.2 <0.2       Lab Results  Component Value Date/Time   CRP 282 (H) 06/18/2017 0102   CRP 248 (H) 06/16/2017 0218   CRP 170 (H) 06/15/2017 0206        Lab Results  Component Value Date/Time   Sedimentation Rate 54 (H) 06/18/2017 0102   Sedimentation Rate 65 (H) 06/13/2017 1807   Sedimentation Rate 69 (H) 06/09/2017 0401       Lab Results  Component Value Date/Time   CK 29 06/19/2017 0020   CK 47 06/12/2017 0345   CK 93 06/06/2017 0114       Micro:   1/25: Intraoperative bile: GS 0 pmns, no organisms, Cx: no growth  2/12: 1 set of blood cultures from the right Mediport (no peripheral bld was sent): positive for Enterobacter cloacae complex in 70 hrs, sensitive to zosyn.   2/18 abdominal GS had >25 PMNs, many GPC in pairs/chains, many GNB and cultures 4+ Enterobacter cloacae complex. The cultures also grew 1+ Candida glabrata   2/21: BCx from the Right peripheral IV, Mediport, L IJ:  No growth  05/03/17: IR-guided I&D with drain placement of the left upper quadrant collection: GS > 25 PMNs, many GNB and GN diplococci, Cultures with 4+ Enterobacter cloacae complex (resistant to Zosyn and CTX), C. Albicans and C. Glabrata, and Candida tropicalis  05/04/17:    1 set of blood cultures from left IJ: VRE at 15.3 TTP. Daptomycin sensitivities: MIC 66mg/ml (dose dependent),  Linezolid   1 set from periphery: VRE and Enterobacter cloacae at 14.8 TTP   05/05/17:  IR-guided I&D with drain placement of the intrahepatic collection: GS 1-10 PMNs, very few GPC in pairs. Fungal cultures with Candida glabrata  05/05/17: Blood cultures from periphery, Mediport and IJ: no growth  05/04/17: Urine cultures obtained (for unclear reason): no growth  05/08/17: Abscess GS: >25 PMNs, many  GPC in chains, few GNB. Aerobic cultures growing 2+ Enterobacter (ertapenem, Cipro, TMP/SMX-sensitive) and 3+VRE, Fungal: C. Glabrata (sens to caspo, vori and dose dependent sens to fluconazole)  06/06/17: 2 sets of BCx (1 set each from periphery and Mediport) - taken after restarting ertapenem: IVAD grew VRE ttp 18.2 hours,  Periphery (Left arm) grew VRE ttp 17.3.  06/07/17 Pleural fluid (left) GS 1-10 PMN's, 1-10  Nucleated white cells, No organisms seen and 1604 nucleated cells. No growth on culture.   06/07/17 Abdominal abscess: labs cancelled - no specimens received.   06/08/17: Mediport BC + for VRE in 23 hrs (R-Amp, R- tetracycline per lab, suppressed result, R- doxycycline;  S-Linezolid, S-Dapto dose dependent),  Right hand peripheral cx: No growth   06/10/17: Blood culture 2/2: 1 periphery (RHand), 1 from Mediport before removed: no growth  06/11/17: Blood Culture 2/2 (both periphery) - no growth     06/19/17:  Perigastric fluid #1:  GS 10-25 PMNs -  GPB, GNB, GPC in pairs. Fungal and AFB stains negative; 2+ Enterococcus faecium (susceps  pending)   06/19/17:  Liver fluid collection:  GS: 0 PMNs, Fungal and AFB stains negative, Cx pending  06/19/17 : Perigastric fluid #2: GS: >25 PMNs, Gram negative bacilli; Fungal and AFB stains negative, Cx pending     Imaging/Other Relevant Diagnostics:   No New Imaging    Assessment and Plan:  ID problem(s):   - s/p enterobacter cloacae bacteremia  - Multiple and extensive Intra-abdominal abscesses growing VRE, enterobacter and candida glabrata  - VRE Bacteremia     72 y.o. female with h/o recent PE and pancreatic cancer, s/p whipple procedure (goal of cure), complicated by delayed gastric emptying, elevated LFTs and sepsis, mild malnutrition 2/2 early satiety and anorexia (requiring PEG), and multiple intraabdominal and hepatic abscesses.  See HPI above for detailed relevant history.    Amalea continues on high dose fluconazole, Daptomycin (pushed up to 10 mg/kg on 4/16), and Ertapenem.  Her left sided perigastric abscess drain and right sided hepatic abscess drains are each putting out about 10-20 cc drainage per shift.  She is reporting less pain today and appears more relaxed on current pain regimen, smiling frequently during visit, still quiet hesitant regarding abdominal physical exam and it is difficult for her to turn in bed d/t fearful for inciting increased pain with movement and  generally deconditioned requiring assistance.  Appreciate Palliative Care following patient/recs.  Grizel is currently NPO, on TPN and bowel rest.  Her white count is WNL and she remains afebrile. WOB unlabored, O2 sats 91-95 on 1 L nc.   4/17 cultures: Hepatic drainage grew 1+ VRE (S- Dapto dose dependent, Linezolid, Gentamycin), and both specimens from perigastric cultures grew C. Glabrata, sensitivities requested. No change in antibiotic plan at this time. .       Recommendations:   Continue Daptomycin to '10mg'$ /kg daily (round to 800 mg) for VRE coverage, IV Ertapenem 1 gram daily for enterobacter and anaerobe coverage, and IV or PO fluconazole 800 mg daily for C.glabrata coverage. Duration TBD on clinical course and resolution of fluid collections.    Follow biweekly BMP and CBC with diff   Follow weekly CK (while on Daptomycin), liver chemistries (while on antifungal), CRP, and ESR.    Follow 4/17 fungal cultures through finalization.    ID team 3 will continue to follow along     Thank you for allowing [name] to participate in the care of this patient  Please call with questions/concerns.  Lonzo Candy, ANP-BC  Infectious Diseases, Team 3  Pager [phone]  Office Ph [phone]

## 2017-06-24 NOTE — Consults (Signed)
Medical Nutrition Therapy Brief Note: TPN check    72 y.o.femalewith h/o recent PE and pancreatic cancer POD # 83status post whipple procedure complicated by delayed gastric emptying. NGT replaced on 04/04/17. UGI on 04/12/17 concerning for obstruction just beyond the [place] in the efferent limb. Course complicated on 6/96/29 by rising LFTs and sepsis with CT concerning for cholangitis given biliary tree obstruction and PV compression due to hematoma and afferent limb distention in setting of PE treatment. Is s/p IR PTC on 04/16/17 and IR percutaneous aspiration of anterior abdominal fluid collection on 04/24/17. IR LUQ perc drain placement 3/1. IR anterior abdominal 8f perc drain placement 3/3. IR anterior perc drain into left hepatic collection 3/6.    Chart, medication, labs, TPN order, I/Os reviewed. TPN is at goal (day 2).    Good UOP. 225 mL PEG output, 107 mL drain, BM x1.    Chloride 95 (L), corrected calcium 9.54 WNL (albumin of 2.2), sodium 133 WNL (low end), potassium 3.9 WNL, phosphorus 4.1 WNL, magnesium 1.9 WNL. Prealbumin (on 4/22) 9 (L), CRP on 4/16 282 (H). TGs (on 4/22) 231, t bili <0.2 WNL. POCT glucose ranging 124-150 x 24 hours.  Pt is on sliding scale insulin, bowel regimen, IV antibiotics.      Nutrition Intervention:   1. Continue Day 2 TPN (goal):   Rate: 60 mL/hr                     Volume (Amino Acid/Dextrose solution): 1440 mL/day                                Amino acids: 58 g/L                                 Dextrose: 187 g/L                                 Standard additives:                                                  Sodium 112 mEq/L                                                  Maximize chloride                                      Insulin: 0 units/LITER                          Fat Emulsion 20% infusion: 54 gm/day (to be infused over 12 hours)              Day 2: If BGs/electrolytes are acceptable, advance dextrose to 187 g/L (goal)  -TPN, at goal, will provide 1790  kcal/day, 84 g protein/day, 269 g dextrose/day and 1710 mL total fluid/day (including amino acid/dextrose solution and lipid volume).     2.  Continue to monitor PO4, Mg, BMP (basic metabolic panel)  3.   Check triglycerides weekly, every Monday. Hold lipids if TG >400.  4.   Weight weekly               Will continue to monitor.    Amanda Bun, MS, RD, [place]   Clinical Dietitian  Pager [phone]

## 2017-06-24 NOTE — Progress Notes (Signed)
Palliative Care Progress Note    HPI: [name] w/ PMH fibromyalgia, pancreatic CA s/p Whipple for 'cure' 03/29/17; s/p ICU w/ intub for resp failure & septic shock, weaned from pressors and extubated to HFNC 2/17 w/ IR drainage bili fluid collection 2/19, s/p IV abx, now all abd drains for abscesses removed. Palliative following for symptoms, started methadone 2/19, weaned dose 2/23 d/t concern for sedation. Pt w/ ongoing poor po intake, now tolerating bolus fds via dubhoff fdg tube. Unfortunately not able to get PEG w/o going to OR (anticipate 4/11), s/p R pleural pigtail for ~ 1 L drainage, now removed and + VRE from mediport, now removed 4/8. S/p PICC & PEG in IR on 4/12 w/ incr pain & FU CT abd and pelvis on 4/15 showing enlarging and new fluid collections c/w abscesses, possible disruption of gastrojejunostomy. S/p IR perc drain x 2 on 4/17 & conservative rx w/ bowel rest w/ TPN. Goal dispo SNF Rehab.    Subjective: "My pain is 1/10, my belly hurts if you push on it but not really if I'm just lying here. My anxiety is from not having my button near me to push (PCA and Call Light). I feel like my nurses don't believe me when I'm crying out in pain"    Objective: chart reviewed, noted that a basal rate was added to pt's dilaudid PCA on 4/19 which likely contributed to excessive sedation; pt was seen by [facility] attdg Dr Nada Boozer over [place] who advised to stop basal rate as pt receiving long acting methadone pain med and increase freq ativan Q 6 hrs though to hold methadone &/or ativan if pt still sedated as goals to optimize function for recovery ideally back to independent living; noted pt has not received her ATC ativan since 4/20 & last dose methadone was yesterday at 1040 am; pt currently still on low dose basal rate of dilaudid w/ prn on PCA - received 12.6 mg IV dilaudid from 4/20 6 pm to 4/21 7 pm & ~ 5.7 mg IV dilaudid when cleared again at 630 am; noted pt also received dose of thorazine prn for hiccups on 4/20 at 6 pm  which may also have contributed to perceived excessive sedation.    Visited w/ pt and her dtr [name] this afternoon; pt was awake, talkative and reporting good pain control, she was anxious about having her PCA and Call Light within reach but did not hit PCA button when she had it; pt's dtr [name] stating she is relieved pt's pain finally seems to be better controlled and wanted pt to stay on the continuous PCA basal rate w/ PRN; [name] stating she did not feel pt was excessively sedated over WE and was not happy that basal rate was temporarily stopped per [facility] team rec's; writer explained that best to choose 1 long acting pain med (ie methadone IV ATC or dilaudid cont rate) and use the dilaudid prn via PCA for breakthrough pain; meds reviewed and explained pt hasn't received her methadone IV in last 24 hrs and no ativan since 4/20; reviewed that anticipate methadone would start to wean off by Wednesday over the next 7 days, we could use PCA only instead of methadone, most likely would need to adjust up doses as methadone wears off though dilaudid would work right away vs methadone long acting takes time to wean off and time to reach peak effect; reviewed & pt/dtr agreed w/ changing low dose ativan to PRN only as she is past  acute withdrawal from antidepressant now; pt receiving dulcolax supp to help remain regular and prevent OIC as she has h/o constipation    Pt/dtr requesting my PC colleague [name] NP fu w/ pt tomorrow - pt case discussed as Probation officer off next 2 days    Palliative Care ROS:  Pain mild abd pain, achy 1/10  Nausea no  Dyspnea no  Weakness yes  Constipation no  Delirum no  Last stool 4/22    Physical Examination:   BP: (100-130)/(66-84)   Temp:  [35.5 C (95.9 F)-36.4 C (97.5 F)]   Temp src: Temporal (04/22 1127)  Heart Rate:  [83-97]   Resp:  [16-23]   SpO2:  [91 %-96 %]   Gen appearance: [name], sitting up in bed, reports pain better controlled w/ Dilaudid  PCA  Lungs: easy resp, clear, dimin bases, NC 1 L  Heart: reg, on tele  Abd: large, tender around perc drains and PEG tube now draining pale light yellow liquid, +BS, occas incontinence, voids on own up to BR or commode, soft brown stool today  Extrem: mod LE edema, reddened skin LEs  Skin: warm, dry  Neuro:  A+O x 3; looking much more relaxed, states pain and anxiety currently conttrolled, at baseline ambulates short distances w/ walker & standby 1 assist     Assessment/Plan: [name] w/ pancreatic CA, s/p Whipple in Jan. W/ unfortunately long complicated hospital course, receiving bolus fds via dubhoff & long term IV abx for line related bacteremia, otherwise ready for dispo to SNF Rehab. S/p PEG placement in IR on 4/12 to facilitate dispo plan w/ unfortunate increased pain post w/ abd CT findings of enlarged abscess, concern for perf warranting bowel rest/TPN & meds given IV route. S/p IR perc drain x 2 on 4/17 w/ issues over WE controlling pain & anxiety balanced w/ perceived excessive sedation. Pt and dtr now advocating to keep Dilaudid cont rate on PCA and use just PCA for pain management for now stopping methadone. Anticipate that pt may need further adjustments in PCA dose as her methadone weans out of her system.    Pain/dyspnea  Methadone 2.5 mg IV TID (hold for sedation, ie not arousable to sternal rub) iv methadone reduced 50% from po dose - stop in short term, plan to use basal rate on PCA for continuous pain control  Dilaudid PCA currently 0.3 mg/hr basal rate w/ 0.4 mg IV Q 20 min prn (equiv 1.5 mg/hr max) received 12.6 mg from 4/20 6 pm to 4/21 7 pm; pump checked today at 3 pm & had used 4.9 mg since pump cleared at 630 am -continue w/ basal rate as pt reporting good pain control and hasn't received methadone consistently anyway, anticipate pt likely to need dose escalation over coming days as methadone continues to wean from system  Dilaudid 1 mg IV Q 2 hrs prn available for RN to give above PCA if  warranted (last received x 1 on 4/19)   Lidocaine patch daily x 2 (abd, LUE)  Encourage incentive spirometry prn     Anxiety/agitation/nausea/fatigue  S/p Duloxetine 20 mg po daily --d/c now NPO on bowel rest  Ativan 0.25 mg IV Q 4 hrs ATC (currently being held for sedation, last received 4/20 at 10 am) -stop ATC and order as 0.25 mg IV Q 6 hrs PRN  Ondansetron 4 mg IV Q 6 hrs prn (last x 1 on 4/20)  Thorazine 25 mg IV TID prn hiccups (last x  1 on 4/20)  Promethazine 12.5 mg IV Q 6 hrs prn (last x 1 on 4/15)  S/p introduction of aromatherapy personal inhaler "Amen's blend" & "Stop, Think, Breathe" phone app for relaxation    Prevention of constipation, last stool 4/22  Colace, Miralax & Senna po-d/c now NPO on bowel rest  Bisacodyl supp pr daily (receiving)     Dry flaky edematous Skin  Bacitracin oint BID to BLEs  Eucerin prn for BLEs    GOC/prognosis  Full code  Dtr Rod Mae is HCP  Goal currently is to prioritize pt's symptom control while she is on bowel rest as dispo plans on hold though still eventually hopeful for SNF Rehab/LTC for a period, hopeful that she will be able to make recovery to independent living again    Pt case discussed w/ bedside RN, my PC colleague [name] NP who will plan to see tomorrow and Onc covering provider Lea.     Thank you for allowing [name] to participate in pt's care. We will continue to follow along, please call if questions/concerns.     Total Time Spent 45 minutes: >50% of time was spent in counseling and/or coordination of care.     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    Hypothyroidism E03.9    Pancreatic cancer C25.9    Anemia D64.9     hx of Pulmonary embolism I26.99    Depression  F32.9    Sepsis A41.9       No Known Allergies (drug, envir, food or latex)    Scheduled Meds:    LORazepam  0.25 mg Intravenous Q6H    insulin lispro  0-20 Units Subcutaneous Q6H    heparin (porcine)  5,000 Units Subcutaneous TID    daptomycin IV  10 mg/kg Intravenous Q24H    methadone IV  2.5 mg Intravenous TID    sodium chloride  1 g Oral TID WC    ferrous sulfate  325 mg Oral Daily with breakfast    lactobacillus rhamnosus (GG)  1 capsule Oral Daily    fluconazole (DIFLUCAN) IV  800 mg Intravenous Q24H    ertapenem  1,000 mg Intravenous Q24H    methylphenidate  2.5 mg Oral BID    polyethylene glycol  17 g Per G Tube Daily    lidocaine  1 patch Transdermal Q24H    triamterene-hydrochlorothiazide  1 tablet Oral QAM    lidocaine  1 patch Transdermal Q24H    bisacodyl  10 mg Rectal Daily    DULoxetine  20 mg Oral Daily    docusate sodium  200 mg Oral 2 times per day    acetaminophen  1,000 mg Oral Q8H    levothyroxine  137 mcg Oral Daily       Continuous Infusions:    TPN (2 in 1) Adult Continuous      And    fat emulsion soybean oil      TPN (2 in 1) Adult Continuous 60 mL/hr at 06/24/17 0621    sodium chloride 3 mL/hr (06/24/17 0621)    HYDROmorphone         PRN Meds:  HYDROmorphone PF, chlorproMAZINE, Nursing communication- Give 4 OZ of fruit juice for BG < 70 mg/dl **AND**  dextrose **AND** dextrose **AND** glucagon **AND** POCT glucose, naloxone, mineral oil-hydrophilic petrolatum, promethazine, sodium chloride, sodium chloride, sodium chloride, dextrose, sodium chloride, dextrose, calcium carbonate, ondansetron

## 2017-06-24 NOTE — Progress Notes (Signed)
Mobility Recommendations: OOB via hoyer/hover     Physical Therapy Treatment Note:     06/24/17 1600   Visit Details [facility])   Visit Type [facility]) Follow Up   Reason for visit [facility]) General  (Re-order of PT due to change in status )   PT Tracking   PT TRACKING PT Assigned   Visit Number   Visit Number [facility]) / Treatment Day ([place]) 13  (RN and daughter present t/o session )   Precautions/Observations   Precautions used Yes   LDA Observation IV lines;O2 (comment);PCA;Drain;Monitors  (1L O2)   Fall Precautions General falls precautions   Other Per RN, patient has been unable to get in/out of bed over the past few days, functional mobility has declined.    Pain Assessment   *Is the patient currently in pain? Yes   Pain (Before,During, After) Therapy During   Revised FLACC - Face 1   Revised FLACC - Legs 0   Revised FLACC - Activity 0   Revised FLACC - Cry 1   Revised FLACC - Consolability 1   Revised FLACC Score 3   Pain Intervention(s) Repositioned;Refer to nursing for pain management   Cognition   Arousal/Alertness Delayed responses to stimuli   Following Commands Follows simple commands with increased time;Follows simple commands with repetition   Bed Mobility   Bed mobility Tested   Supine to Sit Minimum assist ;2 person assist;Side rails up (#);Head of bed elevated   Sit to Supine Minimum assist ;2 person assist;Side rails up (#);Head of bed elevated   Additional comments Patient requiring physical assist to support trunk and to manage BLE, reports of lightheadedness once seated EOB, these sx's dissipated after a few minutes. Required support initially to maintain seated balance, then was able to maintain EOB with standby assist.    Transfers   Transfers Tested   Stand Pivot Transfers Moderate assistance;2 person   Sit to Stand Moderate ;2 person assist   Stand to sit Moderate ;2 person assist   Transfer Assistive Device rolling walker   Additional comments Sit<>stand from raised surface (EOB) and commode, requiring assist  x3 to transfer back into bed. Patient slow to rise and required increased time/effort t/o. Writer providing assist at pelvis and trunk, RN providing similar assist. Patient presenting with decreased generalized BLE strength, specifically hip flexor strength, patient unable to clear feet from ground while pivoting.     Mobility   Mobility Not tested (comment)   Balance   Balance Tested   Sitting - Static Contact guard;Supported  (Initially CGA, then able to sit supported with SBA)   Standing - Static Mod assist;Supported   PT AM-PAC Mobility   Turning over in bed? 1   Sitting down on and standing up from a chair with arms? 1   Moving from lying on back to sitting on the side of the bed? 1   Moving to and from a bed to a chair? 2   Need to walk in hospital room? 1   Climbing 3 - 5 steps with a railing? 1   Total Raw Score 7   Standardized Score 26.42   CMS 1-100% Score 92   Assessment   Brief Assessment Remains appropriate for skilled therapy   Problem List Impaired functional mobility;Pain contributing to impairment   Patient / Family Goal rehab   Plan/Recommendation   Treatment Interventions Restorative PT   PT Frequency 2-4x/wk   Mobility Recommendations OOB via hoyer/hover    Referral Recommendations PM&R Consult  Discharge Recommendations Acute Rehab   PT Discharge Equipment Recommended To be determined   Assessment/Recommendations Reviewed With: Nursing;Patient   Next PT Visit BLE strengthening, progress all mobility   Time Calculation   PT Timed Codes 26   PT Untimed Codes 0   PT Unbilled Time 3  (utilizing commode)   PT Total Treatment 26   Plan and Onset date   Plan of Care Date 06/18/17   Treatment Start Date 04/21/17     Chad Cordial, PT, DPT  Pager# [phone]

## 2017-06-24 NOTE — Progress Notes (Addendum)
Surgical Oncology/Hepatobiliary Surgery Progress Note     LOS: 87 days     Subjective:  Interval Events:   No acute events overnight  Feeling slightly better this morning, appears less sedated and pain improved    Objective:  Vitals Sign Ranges for Past 24 Hours:  BP: (100-138)/(66-90)   Temp:  [35.5 C (95.9 F)-36.4 C (97.5 F)]   Temp src: Temporal (04/22 0600)  Heart Rate:  [83-97]   Resp:  [16-25]   SpO2:  [92 %-96 %]     Physical Exam:  General Appearance: Resting in bed, in NAD  Cardiac: regular rate  Respiratory: non-labored breathing on room air  Abdomen: Soft, non distended, G tube site c/d/i, tender surrounding tube. Perc site in hepatic abscess with bile tinged purulent material. Perc drain in abdominal collection with brownish output  Extremities: warm, erythema of the bilateral anterior calves with small amount of skin breakdown, minimal edema no posterior calf tenderness     Labs:    CBC:    Recent Labs  Lab 06/24/17  0021 06/23/17  0428 06/21/17  2356 06/21/17  0102 06/20/17  1240 06/20/17  0200 06/20/17  0000 06/19/17  0020   WBC 8.6 9.3 8.8 9.1  --   --  10.8* 11.1*   Hemoglobin 8.0* 7.9* 7.6* 7.5* 8.0*  --  6.4* 6.9*   Hematocrit 26* 26* 25* 24* 25* 20* 21* 22*   Platelets 512* 508* 502* 535*  --   --  530* 948*       Metabolic Panel:    Recent Labs  Lab 06/24/17  0021 06/23/17  0428 06/21/17  2356 06/21/17  0016 06/20/17  0000 06/19/17  0020   Sodium 133 133 135 135 132* 128*   Potassium 3.9 3.9 3.6 3.9 4.3 4.6   Chloride 95* 96 100 99 94* 91*   CO2 '26 27 26 25 25 25   ' UN 11 10 4* <4* 7 10   Creatinine 0.41* 0.46* 0.48* 0.47* 0.51 0.50*   Glucose 113* 139* 129* 112* 103* 85   Calcium 8.1* 8.1* 8.1* 8.0* 8.2* 8.5*   Magnesium 1.9 1.5*  --   --  1.5*  --    Phosphorus 4.1 4.1  --   --  5.2*  --           Assessment:  72 y.o. female with h/o recent PE and pancreatic cancer POD # 71  status post whipple procedure complicated by delayed gastric emptying.  NGT replaced on 04/04/17. UGI on 04/12/17  concerning for obstruction just beyond the [place] in the efferent limb.  Course complicated on 5/46/27 by rising LFTs and sepsis with CT concerning for cholangitis given biliary tree obstruction and PV compression due to hematoma and afferent limb distention in setting of PE treatment. Is s/p IR PTC on 04/16/17 and IR percutaneous aspiration of anterior abdominal fluid collection on 04/24/17. IR LUQ perc drain placement 3/1. IR anterior abdominal 8f perc drain placement 3/3. IR anterior perc drain into left hepatic collection 3/6.    Plan:  - NPO, IVF  - TPN  - Appreciate palliative care recommendations. Multimodal pain management  - Continue IV antibiotic regimen per ID Recommendations.  - Monitor perc drain output  - Anemia; stable.  - Aggressive bowel regimen  - SCDs, SQ heparin   - Dispo: Pending clinical course, resolution of acute infection and gastric perforation. Will eventually require SNF    CCherylann Banas MD  06/24/2017  7:62 AM   General Surgery Resident      HPB-GI Attending Addendum:   I personally examined the patient, reviewed the notes, and discussed the plan of care with the residents and patient. Agree with detailed resident's note. Please see the note above for details of history, exam, labs, assessment/plan which reflect my input.       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   IR drain placement in retrogastric collection yesterday. Appears to be well drained.   UGI shows gastric injury with leak into contained collection.  Recent bacteremia with VRE.  Continue current antibiotics.   Hyperglycemia with insulin sliding scale and recurrent need for insulin. continue   Post operative anemia. Stable. Transfused one unit. Stable. No bleeding present.  Pulmonary embolism. Present on admission.  Heparin drip once stable for treatment. Holding for procedures.   Hyponatremia. Altering TPN.   Hypoalbuminemia. Severe protein calorie malnutrition.  TPN for now as we cannot use enteral feeding due to gastric leak.   Depression. Ritalin helpful. Daily encouragement and reinforcement of goals.   Constipation . Aggressive regimen.   Physical debility. Requires rehabilitation setting.   Pain control. Increase PCA.       Delano Metz, MD, FACS  Hepatobiliary, Pancreas & GI Surgery

## 2017-06-24 NOTE — Progress Notes (Signed)
06/24/17 1329   [facility] Patient Class Review   Patient Class Review Inpatient   Patient Class Effective as of 03/29/2017    Edwyna Shell, RN  Utilization Review   [phone]  Pager: [phone]

## 2017-06-25 DIAGNOSIS — E039 Hypothyroidism, unspecified: Secondary | ICD-10-CM

## 2017-06-25 LAB — CBC AND DIFFERENTIAL
Baso # K/uL: 0.1 10*3/uL (ref 0.0–0.1)
Basophil %: 0.9 %
Eos # K/uL: 0.5 10*3/uL — ABNORMAL HIGH (ref 0.0–0.4)
Eosinophil %: 4.7 %
Hematocrit: 28 % — ABNORMAL LOW (ref 34–45)
Hemoglobin: 8.8 g/dL — ABNORMAL LOW (ref 11.2–15.7)
IMM Granulocytes #: 0.2 10*3/uL
IMM Granulocytes: 1.4 %
Lymph # K/uL: 1.2 10*3/uL (ref 1.2–3.7)
Lymphocyte %: 11.3 %
MCH: 28 pg/cell (ref 26–32)
MCHC: 32 g/dL (ref 32–36)
MCV: 88 fL (ref 79–95)
Mono # K/uL: 0.8 10*3/uL (ref 0.2–0.9)
Monocyte %: 8 %
Neut # K/uL: 7.8 10*3/uL — ABNORMAL HIGH (ref 1.6–6.1)
Nucl RBC # K/uL: 0 10*3/uL (ref 0.0–0.0)
Nucl RBC %: 0.1 /100 WBC (ref 0.0–0.2)
Platelets: 489 10*3/uL — ABNORMAL HIGH (ref 160–370)
RBC: 3.2 MIL/uL — ABNORMAL LOW (ref 3.9–5.2)
RDW: 17 % — ABNORMAL HIGH (ref 11.7–14.4)
Seg Neut %: 73.7 %
WBC: 10.6 10*3/uL — ABNORMAL HIGH (ref 4.0–10.0)

## 2017-06-25 LAB — BASIC METABOLIC PANEL
Anion Gap: 11 (ref 7–16)
CO2: 26 mmol/L (ref 20–28)
Calcium: 8.6 mg/dL (ref 8.6–10.2)
Chloride: 92 mmol/L — ABNORMAL LOW (ref 96–108)
Creatinine: 0.48 mg/dL — ABNORMAL LOW (ref 0.51–0.95)
GFR,Black: 114 *
GFR,Caucasian: 99 *
Glucose: 130 mg/dL — ABNORMAL HIGH (ref 60–99)
Lab: 14 mg/dL (ref 6–20)
Potassium: 4.3 mmol/L (ref 3.3–5.1)
Sodium: 129 mmol/L — ABNORMAL LOW (ref 133–145)

## 2017-06-25 LAB — POCT GLUCOSE
Glucose POCT: 124 mg/dL — ABNORMAL HIGH (ref 60–99)
Glucose POCT: 125 mg/dL — ABNORMAL HIGH (ref 60–99)
Glucose POCT: 126 mg/dL — ABNORMAL HIGH (ref 60–99)

## 2017-06-25 LAB — MAGNESIUM: Magnesium: 1.6 mg/dL (ref 1.6–2.5)

## 2017-06-25 LAB — PHOSPHORUS: Phosphorus: 4.6 mg/dL — ABNORMAL HIGH (ref 2.7–4.5)

## 2017-06-25 MED ORDER — ACETAMINOPHEN 650 MG RE SUPP *I*
650.0000 mg | Freq: Four times a day (QID) | RECTAL | Status: DC
Start: 2017-06-25 — End: 2017-06-27

## 2017-06-25 MED ORDER — LEVOTHYROXINE SODIUM 100 MCG IV SOLR *I*
69.0000 ug | Freq: Every day | INTRAVENOUS | Status: DC
Start: 2017-06-25 — End: 2017-07-09
  Administered 2017-06-25 – 2017-07-09 (×15): 69 ug via INTRAVENOUS
  Filled 2017-06-25 (×17): qty 3.45

## 2017-06-25 MED ORDER — STERILE WATER FOR INJECTION IJ SOLN *I*
INTRAMUSCULAR | Status: AC
Start: 2017-06-25 — End: 2017-06-25
  Filled 2017-06-25: qty 10

## 2017-06-25 MED ORDER — ALTEPLASE 2 MG IJ SOLR *I*
0.5000 mg | Freq: Once | INTRAMUSCULAR | Status: AC
Start: 2017-06-25 — End: 2017-06-25
  Administered 2017-06-25: 2 mg
  Filled 2017-06-25: qty 2

## 2017-06-25 MED ORDER — STERILE WATER FOR INJECTION (TPN USE ONLY) *I*
INTRAVENOUS | Status: AC
Start: 2017-06-25 — End: 2017-06-26
  Filled 2017-06-25: qty 556.8

## 2017-06-25 MED ORDER — ACETAMINOPHEN 650 MG RE SUPP *I*
650.0000 mg | Freq: Four times a day (QID) | RECTAL | Status: DC
Start: 2017-06-25 — End: 2017-06-25

## 2017-06-25 MED ORDER — ALTEPLASE 2 MG IJ SOLR *I*
0.5000 mg | Freq: Once | INTRAMUSCULAR | Status: AC
Start: 2017-06-25 — End: 2017-06-26
  Filled 2017-06-25: qty 2

## 2017-06-25 MED ORDER — FAT EMULSION SOYBEAN OIL 20 % IV EMUL *WRAPPED*
54.0000 g | Freq: Every day | INTRAVENOUS | Status: AC
Start: 2017-06-25 — End: 2017-06-26
  Administered 2017-06-25: 54 g via INTRAVENOUS
  Filled 2017-06-25: qty 270

## 2017-06-25 MED ORDER — ALTEPLASE 2 MG IJ SOLR *I*
INTRAMUSCULAR | Status: AC
Start: 2017-06-25 — End: 2017-06-25
  Filled 2017-06-25: qty 2

## 2017-06-25 MED ORDER — MAGNESIUM SULFATE 2 GM IN 50 ML *WRAPPED*
2000.0000 mg | Freq: Once | INTRAVENOUS | Status: AC
Start: 2017-06-25 — End: 2017-06-25
  Administered 2017-06-25: 2000 mg via INTRAVENOUS
  Filled 2017-06-25: qty 50

## 2017-06-25 NOTE — Consults (Signed)
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     Patient remains on TPN for nutrition support. Chart, labs, medications / TPN order, I/Os reviewed. Na 129 (L), K+ 4.3 (WNL), Cl 92 (L), CO2 26 (WNL), Ca 8.6 (WNL), PO4 4.6 (H), Mg 1.6 (WNL). Recent t bili <0.2 (WNL) from 4/22. Recent TGs 231 (H) from 4/22.  BGs ranging from 39 - 130 over the past 24 hours.  MAR reviewed with repletions noted for Mg sulfate. I/Os significant for 390 ml emesis, -127 ml drain output, BM x1. Current TPN order reviewed - please see recommendations below for changes.    Recommendations:  1. Continue TPN:   Rate:65mL/hr   Volume (Amino Acid/Dextrose solution):1469mL/day  Amino acids:58g/L  Dextrose: 187g/L   Standard additives:        Sodium 76 mEq/L  Maximize chloride  Insulin: 0units/LITER  Fat Emulsion 20% infusion:54gm/day (to be infused over 12 hours)  -TPN, at goal, will provide 1790kcal/day, 84g protein/day, 269g dextrose/day and 174mL total fluid/day (including amino acid/dextrose solution and lipid volume).  2.  Continue TPN monitoring parameters:     Includes: PO4, Mg, BMP (basic metabolic panel) daily for 3 days or until stable,  then 3 times per week.   CMP (comprehensive metabolic panel), Triglycerides at baseline and weekly.    BG every 6 hours until stable on goal dextrose.   Weight at baseline, then weekly.      Will continue to monitor.     Laurence Aly, [place]  Pager [phone]

## 2017-06-25 NOTE — Progress Notes (Addendum)
Assumed care of pt from 0700-1500. VSS and A&OX3. Pt denies having chest pain, SOB, N, V and D. Pt per pt cannot ambulate in room, only use hoyer/hover mat. Writer and pt did leg and arm exercises as a result to build up some strength. Pt tearful throughout shift, writer reassured the pt. PICC flushes but doesn't have blood return. PICC team notified and came up to put alteplase in distal port.  Will continue to monitor.  Loreli Dollar, RN

## 2017-06-25 NOTE — Progress Notes (Addendum)
Surgical Oncology/Hepatobiliary Surgery Progress Note     LOS: 88 days     Subjective:  Interval Events:   No acute events overnight    Objective:  Vitals Sign Ranges for Past 24 Hours:  BP: (106-130)/(58-79)   Temp:  [35.7 C (96.3 F)-36.3 C (97.3 F)]   Temp src: Temporal (04/23 1014)  Heart Rate:  [84-99]   Resp:  [16-18]   SpO2:  [93 %-96 %]   Weight:  [79.7 kg (175 lb 11.2 oz)]     Physical Exam:  General Appearance: Resting in bed, in NAD  Cardiac: regular rate  Respiratory: non-labored breathing on room air  Abdomen: Soft, non distended, G tube site c/d/i, tender surrounding tube. Perc site in hepatic abscess with bile tinged purulent material. Perc drain in abdominal collection with brownish output  Extremities: warm, well perfused    Labs:    CBC:    Recent Labs  Lab 06/25/17  0405 06/24/17  0021 06/23/17  0428 06/21/17  2356 06/21/17  0102 06/20/17  1240  06/20/17  0000   WBC 10.6* 8.6 9.3 8.8 9.1  --   --  10.8*   Hemoglobin 8.8* 8.0* 7.9* 7.6* 7.5* 8.0*  --  6.4*   Hematocrit 28* 26* 26* 25* 24* 25*  < > 21*   Platelets 489* 512* 508* 502* 535*  --   --  530*   < > = values in this interval not displayed.    Metabolic Panel:    Recent Labs  Lab 06/25/17  0405 06/24/17  0021 06/23/17  0428 06/21/17  2356 06/21/17  0016 06/20/17  0000   Sodium 129* 133 133 135 135 132*   Potassium 4.3 3.9 3.9 3.6 3.9 4.3   Chloride 92* 95* 96 100 99 94*   CO2 _0 UN _1 4* <4* 7   Creatinine 0.48* 0.41* 0.46* 0.48* 0.47* 0.51   Glucose 130* 113* 139* 129* 112* 103*   Calcium 8.6 8.1* 8.1* 8.1* 8.0* 8.2*   Magnesium 1.6 1.9 1.5*  --   --  1.5*   Phosphorus 4.6* 4.1 4.1  --   --  5.2*          Assessment:  72 y.o. female with h/o recent PE and pancreatic cancer POD # 88  status post whipple procedure complicated by delayed gastric emptying.  NGT replaced on 04/04/17. UGI on 04/12/17 concerning for obstruction just beyond the [place] in the efferent limb.  Course complicated on 5/40/98 by rising LFTs and sepsis  with CT concerning for cholangitis given biliary tree obstruction and PV compression due to hematoma and afferent limb distention in setting of PE treatment. Is s/p IR PTC on 04/16/17 and IR percutaneous aspiration of anterior abdominal fluid collection on 04/24/17. IR LUQ perc drain placement 3/1. IR anterior abdominal 52f perc drain placement 3/3. IR anterior perc drain into left hepatic collection 3/6.    Plan:  - NPO, IVF  - TPN  - Appreciate palliative care recommendations. Multimodal pain management  - Continue IV antibiotic regimen per ID Recommendations.  - Monitor perc drain output  - Anemia; stable.  - Aggressive bowel regimen  - SCDs, SQ heparin   - Dispo: Pending clinical course, resolution of acute infection and gastric perforation. Will eventually require SNF     CCherylann Banas MD  06/25/2017     11:31 AM   General Surgery Resident    [facility]B-GI Attending Addendum:  I personally examined the patient, reviewed the notes, and discussed the plan of care with the residents and patient. Agree with detailed resident's note. Please see the note above for details of history, exam, labs, assessment/plan which reflect my input.       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   IR drain placement in retrogastric collection yesterday. Appears to be well drained.   UGI shows gastric injury with leak into contained collection.  Recent bacteremia with VRE.  Continue current antibiotics.   Hyperglycemia with insulin sliding scale and recurrent need for insulin. continue   Post operative anemia. Stable. Transfused one unit. Stable. No bleeding present.  Pulmonary embolism. Present on admission. Heparin drip once stable for treatment. Holding for procedures.   Hyponatremia. Altering TPN.   Hypoalbuminemia. Severe  protein calorie malnutrition.  TPN for now as we cannot use enteral feeding due to gastric leak.   Depression. Ritalin helpful. Daily encouragement and reinforcement of goals.   Constipation . Aggressive regimen.   Physical debility. Requires rehabilitation setting.   Pain control. Increase PCA.       Delano Metz, MD, FACS  Hepatobiliary, Pancreas & GI Surgery

## 2017-06-25 NOTE — Plan of Care (Signed)
Problem: Safety  Goal: Patient will remain free of falls  Outcome: Maintaining      Problem: Pain/Comfort  Goal: Patient's pain or discomfort is manageable  Outcome: Maintaining      Problem: Mobility  Goal: Functional status is maintained or improved - Geriatric  Outcome: Maintaining      Problem: Nutrition  Goal: Patient's nutritional status is maintained or improved  Outcome: Maintaining      Problem: Bowel Elimination  Goal: Elimination patterns are normal or improving  Outcome: Maintaining      Problem: Post-Operative Hemodynamic Stability  Goal: Maintain Hemodynamic Stability  Outcome: Maintaining      Problem: Post-Operative Bowel Elimination  Goal: Elimination pattern is normal or improving  Outcome: Maintaining      Problem: Psychosocial  Goal: Demonstrates ability to cope with illness  Outcome: Maintaining      Problem: Fluid and Electrolyte Imbalance  Goal: Fluid and Electrolyte imbalance  Outcome: Maintaining      Problem: GI Bleeding Elimination  Goal: Elimination of patterns are normal or improving  Outcome: Maintaining

## 2017-06-25 NOTE — Consults (Signed)
PICC team notified this morning in regard to PICC line and difficulties with blood return and flushing. Alteplase placed in distal port and allowed to dwell for 4+ hours with good report noted by bedside RN Lyndee Leo. Claire instructed to switch medications/TPN to distal port and to administer alteplase to proximal port. Mix 2.2 sterile water with alteplase, remove Clave and administer at hub, red cap hub, apply red medication sticker, and allow alteplase to dwell in proximal port for 4+hrs. Lyndee Leo, RN instructed to contact PICC team if there are any further questions or problems.

## 2017-06-25 NOTE — Progress Notes (Signed)
Palliative Care Progress Note  Brief HPI: Amanda Galloway is a [name] w/ PMH fibromyalgia, pancreatic CA s/p Whipple for 'cure' 03/29/17; s/p ICU w/ intub for resp failure & septic shock, weaned from pressors and extubated to HFNC 2/17 w/ IR drainage bili fluid collection 2/19, s/p IV abx, now all abd drains for abscesses removed. Palliative following for symptoms, started methadone 2/19, weaned dose 2/23 d/t concern for sedation. Pt w/ ongoing poor po intake, now tolerating bolus fds via dubhoff fdg tube. Unfortunately not able to get PEG w/o going to OR (anticipate 4/11), s/p R pleural pigtail for ~ 1 L drainage, now removed and + VRE from mediport, now removed 4/8. S/p PICC & PEG in IR on 4/12 w/ incr pain & FU CT abd and pelvis on 4/15 showing enlarging and new fluid collections c/w abscesses, possible disruption of gastrojejunostomy. S/p IR perc drain x 2 on 4/17 & conservative rx w/ bowel rest w/ TPN. Goal dispo SNF Rehab.     Significant 24h Events: Some PICC line ports not working and alteplase instilled. Pain control stable 2 3/10 rate off methadone. This afternoon is tearful and having difficulties managing pain d/t increase stress level.     Subjective: Tearfully explaining ' I have a hole I my bowel". Appears stressed and acutely distraught. Her daughter [name] at bedside explaining "Dr Ron Agee just rounded."     Palliative Care ROS: Some abdominal discomfort at drain sites. Denies nausea, shortness of breath. Feels anxious and is fatigued.    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    Hypothyroidism E03.9    Pancreatic cancer C25.9    Anemia D64.9     hx of Pulmonary embolism I26.99    Depression F32.9    Sepsis A41.9       No Known Allergies (drug, envir, food or latex)    Scheduled Meds:    levothyroxine IV  69 mcg Intravenous Daily    alteplase  0.5-2 mg  Intracatheter Once    acetaminophen  650 mg Rectal Q6H    insulin lispro  0-20 Units Subcutaneous Q6H    heparin (porcine)  5,000 Units Subcutaneous TID    daptomycin IV  10 mg/kg Intravenous Q24H    sodium chloride  1 g Oral TID WC    fluconazole (DIFLUCAN) IV  800 mg Intravenous Q24H    ertapenem  1,000 mg Intravenous Q24H    methylphenidate  2.5 mg Oral BID    lidocaine  1 patch Transdermal Q24H    triamterene-hydrochlorothiazide  1 tablet Oral QAM    lidocaine  1 patch Transdermal Q24H    bisacodyl  10 mg Rectal Daily       Continuous Infusions:    TPN (2 in 1) Adult Continuous 60 mL/hr at 06/25/17 1800    And    fat emulsion soybean oil 54 g (06/25/17 1801)    sodium chloride Stopped (06/24/17 1925)    HYDROmorphone         PRN Meds:  LORazepam, HYDROmorphone PF, chlorproMAZINE, Nursing communication- Give 4 OZ of fruit juice for BG < 70 mg/dl **AND** dextrose **AND** dextrose **AND** glucagon **AND** POCT glucose, naloxone, mineral oil-hydrophilic petrolatum, promethazine, sodium chloride, sodium chloride, sodium chloride, dextrose, sodium chloride, dextrose, ondansetron    Past 24h PRN Usage:  Dilaudid PCA 0.4 mg x 26  over 24 hrs. (10.4 mg/24 hrs)    Physical Examination:   BP: (106-129)/(58-79)   Temp:  [35.7 C (96.3 F)-36.2 C (97.2 F)]   Temp src: Temporal (04/23 1342)  Heart Rate:  [82-99]   Resp:  [16-18]   SpO2:  [93 %-96 %]   General appearance: Distraught and tearful  Head: MMM. Sclera anicteric, + orbital edema.  Lungs: RRR, decreased in bases bilateral  Heart: S1, S2 RRR  Abdomen: hypoactive BS, bilateral drains w scant drg  Extremities: pale, warm, and dry. Edema left UE. PICC dsg D+I.  Neurologic: Emotional and tearful initially. Mood improved after comfort and emotional support given.    Assessment/Plan:  Elasia has had a prolonged and complicated hospital course and her pain tolerance can be linked with her labile anxiety and fatigue.     Pain/Dyspnea/Excess Secretions  -  continue to hold methadone  - continue current dilaudid @ 0.3 mg/hr w 0.4 mg q 20 minutes. Total prn 10.4 mg/24 hrs. If increase PCA use w rising pain level, consider increase basal dose tomorrow as methadone likely mostly out of her system  - in addition has a Producer, television/film/video dose of 1mg  q 2 hrs for uncontrolled pain.  -continue Lido patch    Anxiety/Agitation/nausea/fatigue/hiccoughs  - duloxetine on hold w NPO  - Thorazine prn hiccoughs  - ativan 0.25 mg IV q 6 hrs prn  - zofran 4 mg IV q 6 hrs prn, would avoid phenergan    Constipation, last BM 4/21  - bisacodyl supp pr every day prn    Dispo: pending. Plan of care discussed w NP and bedside nurse.    [name] ANP  Palliative Care Service  [facility] [phone]  06/25/2017 6:11 PM

## 2017-06-26 DIAGNOSIS — R066 Hiccough: Secondary | ICD-10-CM

## 2017-06-26 LAB — MAGNESIUM: Magnesium: 1.9 mg/dL (ref 1.6–2.5)

## 2017-06-26 LAB — CBC AND DIFFERENTIAL
Baso # K/uL: 0.1 10*3/uL (ref 0.0–0.1)
Basophil %: 0.9 %
Eos # K/uL: 0.5 10*3/uL — ABNORMAL HIGH (ref 0.0–0.4)
Eosinophil %: 4.4 %
Hematocrit: 26 % — ABNORMAL LOW (ref 34–45)
Hemoglobin: 8 g/dL — ABNORMAL LOW (ref 11.2–15.7)
IMM Granulocytes #: 0.2 10*3/uL
IMM Granulocytes: 1.4 %
Lymph # K/uL: 1.2 10*3/uL (ref 1.2–3.7)
Lymphocyte %: 11 %
MCH: 28 pg/cell (ref 26–32)
MCHC: 31 g/dL — ABNORMAL LOW (ref 32–36)
MCV: 88 fL (ref 79–95)
Mono # K/uL: 0.9 10*3/uL (ref 0.2–0.9)
Monocyte %: 8.2 %
Neut # K/uL: 8.2 10*3/uL — ABNORMAL HIGH (ref 1.6–6.1)
Nucl RBC # K/uL: 0 10*3/uL (ref 0.0–0.0)
Nucl RBC %: 0 /100 WBC (ref 0.0–0.2)
Platelets: 463 10*3/uL — ABNORMAL HIGH (ref 160–370)
RBC: 2.9 MIL/uL — ABNORMAL LOW (ref 3.9–5.2)
RDW: 17 % — ABNORMAL HIGH (ref 11.7–14.4)
Seg Neut %: 74.1 %
WBC: 11.1 10*3/uL — ABNORMAL HIGH (ref 4.0–10.0)

## 2017-06-26 LAB — PHOSPHORUS: Phosphorus: 4.9 mg/dL — ABNORMAL HIGH (ref 2.7–4.5)

## 2017-06-26 LAB — CK: CK: 48 U/L (ref 26–192)

## 2017-06-26 LAB — POCT GLUCOSE
Glucose POCT: 121 mg/dL — ABNORMAL HIGH (ref 60–99)
Glucose POCT: 131 mg/dL — ABNORMAL HIGH (ref 60–99)
Glucose POCT: 133 mg/dL — ABNORMAL HIGH (ref 60–99)
Glucose POCT: 136 mg/dL — ABNORMAL HIGH (ref 60–99)

## 2017-06-26 LAB — BASIC METABOLIC PANEL
Anion Gap: 11 (ref 7–16)
CO2: 26 mmol/L (ref 20–28)
Calcium: 8.5 mg/dL — ABNORMAL LOW (ref 8.6–10.2)
Chloride: 91 mmol/L — ABNORMAL LOW (ref 96–108)
Creatinine: 0.51 mg/dL (ref 0.51–0.95)
GFR,Black: 112 *
GFR,Caucasian: 97 *
Glucose: 113 mg/dL — ABNORMAL HIGH (ref 60–99)
Lab: 14 mg/dL (ref 6–20)
Potassium: 4 mmol/L (ref 3.3–5.1)
Sodium: 128 mmol/L — ABNORMAL LOW (ref 133–145)

## 2017-06-26 MED ORDER — INSULIN LISPRO (HUMAN) 100 UNIT/ML IJ/SC SOLN *WRAPPED*
0.0000 [IU] | Freq: Four times a day (QID) | SUBCUTANEOUS | Status: DC
Start: 2017-06-26 — End: 2017-07-02

## 2017-06-26 MED ORDER — FAT EMULSION SOYBEAN OIL 20 % IV EMUL *WRAPPED*
54.0000 g | Freq: Every day | INTRAVENOUS | Status: AC
Start: 2017-06-26 — End: 2017-06-27
  Administered 2017-06-26: 54 g via INTRAVENOUS
  Filled 2017-06-26: qty 270

## 2017-06-26 MED ORDER — ALTEPLASE 2 MG IJ SOLR *I*
0.5000 mg | Freq: Once | INTRAMUSCULAR | Status: AC
Start: 2017-06-26 — End: 2017-06-26
  Filled 2017-06-26: qty 2

## 2017-06-26 MED ORDER — STERILE WATER FOR INJECTION (TPN USE ONLY) *I*
INTRAVENOUS | Status: AC
Start: 2017-06-26 — End: 2017-06-27
  Filled 2017-06-26: qty 556.8

## 2017-06-26 NOTE — Progress Notes (Signed)
Surgical Oncology/Hepatobiliary Surgery Progress Note     LOS: 89 days     Subjective:  Interval Events:   No acute events overnight. Resting comfortably this AM. Drinking grape juice.    Objective:  Vitals Sign Ranges for Past 24 Hours:  BP: (103-117)/(57-72)   Temp:  [35.7 C (96.3 F)-36.3 C (97.3 F)]   Temp src: Temporal (04/24 0747)  Heart Rate:  [82-96]   Resp:  [16-18]   SpO2:  [93 %-97 %]     Physical Exam:  General Appearance: Resting in bed, in NAD  Cardiac: regular rate  Respiratory: non-labored breathing on room air  Abdomen: Soft, non distended, G tube site c/d/i, tender surrounding tube. Perc site in hepatic abscess with bile tinged purulent material. Perc drain in abdominal collection with brownish output (no grape juice noted in bag)  Extremities: warm, well perfused    Labs:    CBC:    Recent Labs  Lab 06/26/17  0013 06/25/17  0405 06/24/17  0021 06/23/17  0428 06/21/17  2356 06/21/17  0102   WBC 11.1* 10.6* 8.6 9.3 8.8 9.1   Hemoglobin 8.0* 8.8* 8.0* 7.9* 7.6* 7.5*   Hematocrit 26* 28* 26* 26* 25* 24*   Platelets 463* 489* 512* 508* 502* 782*       Metabolic Panel:    Recent Labs  Lab 06/26/17  0013 06/25/17  0405 06/24/17  0021 06/23/17  0428 06/21/17  2356 06/21/17  0016 06/20/17  0000   Sodium 128* 129* 133 133 135 135 132*   Potassium 4.0 4.3 3.9 3.9 3.6 3.9 4.3   Chloride 91* 92* 95* 96 100 99 94*   CO2 '26 26 26 27 26 25 25   ' UN '14 14 11 10 ' 4* <4* 7   Creatinine 0.51 0.48* 0.41* 0.46* 0.48* 0.47* 0.51   Glucose 113* 130* 113* 139* 129* 112* 103*   Calcium 8.5* 8.6 8.1* 8.1* 8.1* 8.0* 8.2*   Magnesium 1.9 1.6 1.9 1.5*  --   --  1.5*   Phosphorus 4.9* 4.6* 4.1 4.1  --   --  5.2*          Assessment:  72 y.o. female with h/o recent PE and pancreatic cancer POD # 62  status post whipple procedure complicated by delayed gastric emptying.  NGT replaced on 04/04/17. UGI on 04/12/17 concerning for obstruction just beyond the [place] in the efferent limb.  Course complicated on 9/56/21 by rising LFTs and  sepsis with CT concerning for cholangitis given biliary tree obstruction and PV compression due to hematoma and afferent limb distention in setting of PE treatment. Is s/p IR PTC on 04/16/17 and IR percutaneous aspiration of anterior abdominal fluid collection on 04/24/17. IR LUQ perc drain placement 3/1. IR anterior abdominal 74f perc drain placement 3/3. IR anterior perc drain into left hepatic collection 3/6.  Underwent attempted IR G-tube placement on 4/12 c/b likely posterior wall gastric perforation; also with interval increase in size of hepatic abscesses s/p right hepatic collection perc drain & lesser sac abscess perc drain on 4/17 with plan for conservative mgmt of iatrogenic gastric perforation    Plan:  - NPO, IVF.TPN. Taking grape juice without evidence of juice in L perc drain. Repeat imaging on 4/26  - Appreciate palliative care recommendations. Multimodal pain management  - Continue IV antibiotic regimen per ID Recommendations.  - Monitor perc drain output  - Anemia; stable.  - Aggressive bowel regimen  - SCDs, SQ heparin   -  Dispo: Pending clinical course, resolution of acute infection and gastric perforation. Will eventually require SNF     Belinda Fisher, MD  06/26/2017     8:08 AM   General Surgery Resident

## 2017-06-26 NOTE — Progress Notes (Signed)
Assumed care of pt at 2300. AVSS and A&O x3. Lethargic but arousable to speech. On tele NSR. PEG to gravity. Perc drain x2 flushed. Using PCA appropriately. Pt tearful but cooperative. TPN and lipids infusing. Lipids later taken down. T&P Q2. Used bedpan overnight, adequate UOP. Full report given to oncoming shift. Will continue to monitor and treat as ordered.

## 2017-06-26 NOTE — Progress Notes (Signed)
Palliative Care Progress Note    Brief HPI: Anaiz is a [name] w/ PMH fibromyalgia, pancreatic CA s/p Whipple for 'cure' 03/29/17; s/p ICU w/ intub for resp failure & septic shock, weaned from pressors and extubated to HFNC 2/17 w/ IR drainage bili fluid collection 2/19, s/p IV abx, now all abd drains for abscesses removed. Palliative following for symptoms, started methadone 2/19, weaned dose 2/23 d/t concern for sedation. Pt w/ ongoing poor po intake, now tolerating bolus fds via dubhoff fdg tube. Unfortunately not able to get PEG w/o going to OR (anticipate 4/11), s/p R pleural pigtail for ~ 1 L drainage, now removed and + VRE from mediport, now removed 4/8. S/p PICC & PEG in IR on 4/12 w/ incr pain & FU CT abd and pelvis on 4/15 showing enlarging and new fluid collections c/w abscesses, possible disruption of gastrojejunostomy.      S/p IR perc drain x 2 on 4/17 & conservative rx w/ bowel rest w/ TPN. Goal dispo SNF Rehab.     Subjective: " I am hurting more, I can't get enough medication to stay comfortable"  Patient having 17 attempts (6 boluses received) in past 8 hr. Patient rates pain 5-6/10 that is higher than before.     Palliative Care ROS:  Pain   Moderate  Nausea   Mild  Anorexia   None  Anxiety   Moderate  Depression   Mild  Shortness of Breath   None  Tiredness/Fatigue   Moderate  Drowsiness/Sleepiness   Mild  Airway Secretions   no  Constipation   no  Delirium   no    Physical Examination:   BP: (103-117)/(55-72)   Temp:  [35.7 C (96.3 F)-36.3 C (97.3 F)]   Temp src: Temporal (04/24 0747)  Heart Rate:  [82-96]   Resp:  [16-18]   SpO2:  [93 %-99 %]   BP 122/68 (BP Location: Right arm)    Pulse 104    Temp 36.5 C (97.7 F) (Temporal)    Resp 16    Ht 1.702 m (5\' 7" )    Wt 79.7 kg (175 lb 11.2 oz)    SpO2 95%    BMI 27.51 kg/m   General appearance: alert, fatigued and intermittently tearful  Throat: lips, mucosa, and tongue normal; teeth and gums normal, dry mucosa  Lungs: clear to auscultation  bilaterally  Heart: regular rate and rhythm  Abdomen: Distended, soft, +bowel sounds, PEG tube (to drainage) and right and left abdominal drains present and draining   Extremities: extremities normal, atraumatic, no cyanosis or edema  Neurologic: Grossly normal, oriented to self & place, responding to support and smiling at end of visit    Assessment/Plan:    Pain/Dyspnea  - continue to hold methadone, per daughter - patient much more alert today.  - increase dilaudid to 0.6 mg/hr w 0.4 mg q 20 minutes. On 4/23 used bolus x 10 and this AM x 6 (with attempts x 17) . Patient teary this afternoon when talking about how pain has increased today.    Rating pain was 0-3/10 (abdomen) but now completely off methadone, has increased to 5/10.    Increased basal rate will help with reducing pain overall and may reduce number of PRN attempts.     - in addition has a Producer, television/film/video dose of 1mg  q 2 hrs for uncontrolled pain. Has not needed as of yet.  -continue Lido patch    Anxiety/Agitation/nausea/fatigue  - duloxetine on hold w  NPO  - Thorazine 25 mg IV prn hiccoughs (x 1 in 24 hr with good response)  - ativan 0.25 mg IV q 6 hrs prn  - zofran 4 mg IV q 6 hrs prn (x 1 in 24 hr)    Constipation, last BM 4/21  - bisacodyl supp pr every day prn    Emotional support extended to patient and daughter.  She is in favor of raising continuous IV dilaudid, continues TPN and 3 antibiotics.  Patient in agreement with continued medical interventions so far.       Total Time Spent 35 minutes:   >50% of time was spent in counseling and/or coordination of care.   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    Hypothyroidism E03.9    Pancreatic cancer C25.9    Anemia D64.9      hx of Pulmonary embolism I26.99    Depression F32.9    Sepsis A41.9       No Known Allergies (drug, envir, food or latex)    Scheduled Meds:    alteplase  0.5-2 mg Intracatheter Once    levothyroxine IV  69 mcg Intravenous Daily    acetaminophen  650 mg Rectal Q6H    insulin lispro  0-20 Units Subcutaneous Q6H    heparin (porcine)  5,000 Units Subcutaneous TID    daptomycin IV  10 mg/kg Intravenous Q24H    sodium chloride  1 g Oral TID WC    fluconazole (DIFLUCAN) IV  800 mg Intravenous Q24H    ertapenem  1,000 mg Intravenous Q24H    methylphenidate  2.5 mg Oral BID    lidocaine  1 patch Transdermal Q24H    triamterene-hydrochlorothiazide  1 tablet Oral QAM    lidocaine  1 patch Transdermal Q24H    bisacodyl  10 mg Rectal Daily       Continuous Infusions:    TPN (2 in 1) Adult Continuous      And    fat emulsion soybean oil      TPN (2 in 1) Adult Continuous 60 mL/hr at 06/25/17 2255    sodium chloride Stopped (06/24/17 1925)    HYDROmorphone         PRN Meds:  LORazepam, HYDROmorphone PF, chlorproMAZINE, Nursing communication- Give 4 OZ of fruit juice for BG < 70 mg/dl **AND** dextrose **AND** dextrose **AND** glucagon **AND** POCT glucose, naloxone, mineral oil-hydrophilic petrolatum, promethazine, sodium chloride, sodium chloride, sodium chloride, dextrose, sodium chloride, dextrose, ondansetron

## 2017-06-26 NOTE — Progress Notes (Signed)
Assumed care of pt at 0700. Pt got to chair very early in shift and stayed there throughout shift. PCA pump increased. PEG to gravity & bilat perc drains flushed per order with some output. VSS throughout shift. Will pass onto oncoming shift.

## 2017-06-26 NOTE — Plan of Care (Signed)
Bowel Elimination     Elimination patterns are normal or improving Maintaining        GI Bleeding Elimination     Elimination of patterns are normal or improving Maintaining        Nutrition     Patient's nutritional status is maintained or improved Maintaining     Nutritional status is maintained or improved - Geriatric Maintaining        Pain/Comfort     Patient's pain or discomfort is manageable Maintaining        Post-Operative Bowel Elimination     Elimination pattern is normal or improving Maintaining        Post-Operative Hemodynamic Stability     Maintain Hemodynamic Stability Maintaining        Safety     Patient will remain free of falls Maintaining          Fluid and Electrolyte Imbalance     Fluid and Electrolyte imbalance Progressing towards goal        Mobility     Functional status is maintained or improved - Geriatric Progressing towards goal     Patient's functional status is maintained or improved Progressing towards goal        Psychosocial     Demonstrates ability to cope with illness Progressing towards goal

## 2017-06-26 NOTE — Consults (Signed)
Medical Nutrition Therapy Brief Note: TPN check    72 y.o. female with h/o recent PE and pancreatic cancer POD # 60  status post whipple procedure complicated by delayed gastric emptying.  NGT replaced on 04/04/17. UGI on 04/12/17 concerning for obstruction just beyond the [place] in the efferent limb.  Course complicated on 08/06/52 by rising LFTs and sepsis with CT concerning for cholangitis given biliary tree obstruction and PV compression due to hematoma and afferent limb distention in setting of PE treatment. Is s/p IR PTC on 04/16/17 and IR percutaneous aspiration of anterior abdominal fluid collection on 04/24/17. IR LUQ perc drain placement 3/1. IR anterior abdominal 75f perc drain placement 3/3. IR anterior perc drain into left hepatic collection 3/6.    Patient remains on TPN for nutrition support. Chart, labs, medications / TPN order, I/Os reviewed. Na 128 (L), K+ 4.0 (WNL), Cl 91 (L), CO2 26 (WNL), Ca 8.4 (L), PO4 4.9 (H), Mg 1.9 (WNL). BGs ranging from 113 - 136 over the past 24 hours. MAR reviewed with repletions noted for Mg sulfate. I/Os significant for 2075 ml UOP, 325 ml PEG output, -115 ml drain output. Current TPN order reviewed - please see recommendations below for changes.    Recommendations:  1. Continue TPN: Rate:647mhr   Volume (Amino Acid/Dextrose solution):[id number]  Amino acids:58g/L  Dextrose: 187g/L   Standard additives:  sodium 96 mEq/L      Phosphate 9 mmol/L   Maximize chloride  Insulin: 0units/LITER  Fat Emulsion 20% infusion:54gm/day (to be infused over 12 hours)  -TPN, at goal, will provide 1790kcal/day, 84g protein/day, 269g dextrose/day and 1710m3mtal fluid/day (including amino acid/dextrose solution and  lipid volume).  2.  Continue TPN monitoring parameters:                Includes: PO4, Mg, BMP (basic metabolic panel) daily for 3 days or until stable,    then 3 times per week.              CMP (comprehensive metabolic panel), Triglycerides at baseline and weekly.               BG every 6 hours until stable on goal dextrose.              Weight at baseline, then weekly.    Will continue to monitor.     LoryLaurence Aly  [place] [phone]

## 2017-06-27 LAB — HEPATIC FUNCTION PANEL
ALT: 33 U/L (ref 0–35)
AST: 29 U/L (ref 0–35)
Albumin: 2 g/dL — ABNORMAL LOW (ref 3.5–5.2)
Alk Phos: 437 U/L — ABNORMAL HIGH (ref 35–105)
Bilirubin,Direct: 0.9 mg/dL — ABNORMAL HIGH (ref 0.0–0.3)
Bilirubin,Total: <0.2 mg/dL (ref 0.0–1.2)
Total Protein: 5.5 g/dL — ABNORMAL LOW (ref 6.3–7.7)

## 2017-06-27 LAB — CBC AND DIFFERENTIAL
Baso # K/uL: 0.1 10*3/uL (ref 0.0–0.1)
Basophil %: 0.9 %
Eos # K/uL: 0.4 10*3/uL (ref 0.0–0.4)
Eosinophil %: 3.8 %
Hematocrit: 25 % — ABNORMAL LOW (ref 34–45)
Hemoglobin: 7.7 g/dL — ABNORMAL LOW (ref 11.2–15.7)
IMM Granulocytes #: 0.1 10*3/uL
IMM Granulocytes: 1.3 %
Lymph # K/uL: 1.2 10*3/uL (ref 1.2–3.7)
Lymphocyte %: 10.6 %
MCH: 27 pg/cell (ref 26–32)
MCHC: 31 g/dL — ABNORMAL LOW (ref 32–36)
MCV: 89 fL (ref 79–95)
Mono # K/uL: 1 10*3/uL — ABNORMAL HIGH (ref 0.2–0.9)
Monocyte %: 9.3 %
Neut # K/uL: 8.3 10*3/uL — ABNORMAL HIGH (ref 1.6–6.1)
Nucl RBC # K/uL: 0 10*3/uL (ref 0.0–0.0)
Nucl RBC %: 0 /100 WBC (ref 0.0–0.2)
Platelets: 449 10*3/uL — ABNORMAL HIGH (ref 160–370)
RBC: 2.8 MIL/uL — ABNORMAL LOW (ref 3.9–5.2)
RDW: 17.2 % — ABNORMAL HIGH (ref 11.7–14.4)
Seg Neut %: 74.1 %
WBC: 11.2 10*3/uL — ABNORMAL HIGH (ref 4.0–10.0)

## 2017-06-27 LAB — BASIC METABOLIC PANEL
Anion Gap: 11 (ref 7–16)
CO2: 26 mmol/L (ref 20–28)
Calcium: 8.7 mg/dL (ref 8.6–10.2)
Chloride: 93 mmol/L — ABNORMAL LOW (ref 96–108)
Creatinine: 0.51 mg/dL (ref 0.51–0.95)
GFR,Black: 112 *
GFR,Caucasian: 97 *
Glucose: 126 mg/dL — ABNORMAL HIGH (ref 60–99)
Lab: 17 mg/dL (ref 6–20)
Potassium: 4 mmol/L (ref 3.3–5.1)
Sodium: 130 mmol/L — ABNORMAL LOW (ref 133–145)

## 2017-06-27 LAB — POCT GLUCOSE
Glucose POCT: 108 mg/dL — ABNORMAL HIGH (ref 60–99)
Glucose POCT: 121 mg/dL — ABNORMAL HIGH (ref 60–99)
Glucose POCT: 138 mg/dL — ABNORMAL HIGH (ref 60–99)
Glucose POCT: 141 mg/dL — ABNORMAL HIGH (ref 60–99)

## 2017-06-27 LAB — PHOSPHORUS: Phosphorus: 4.4 mg/dL (ref 2.7–4.5)

## 2017-06-27 LAB — MAGNESIUM: Magnesium: 1.7 mg/dL (ref 1.6–2.5)

## 2017-06-27 MED ORDER — MAGNESIUM SULFATE 2 GM IN 50 ML *WRAPPED*
2000.0000 mg | Freq: Once | INTRAVENOUS | Status: AC
Start: 2017-06-27 — End: 2017-06-27
  Administered 2017-06-27: 2000 mg via INTRAVENOUS
  Filled 2017-06-27: qty 50

## 2017-06-27 MED ORDER — FAT EMULSION SOYBEAN OIL 20 % IV EMUL *WRAPPED*
54.0000 g | Freq: Every day | INTRAVENOUS | Status: AC
Start: 2017-06-27 — End: 2017-06-28
  Administered 2017-06-27: 54 g via INTRAVENOUS
  Filled 2017-06-27: qty 500

## 2017-06-27 MED ORDER — STERILE WATER FOR INJECTION (TPN USE ONLY) *I*
INTRAVENOUS | Status: AC
Start: 2017-06-27 — End: 2017-06-28
  Filled 2017-06-27: qty 556.8

## 2017-06-27 MED ORDER — ACETAMINOPHEN 650 MG RE SUPP *I*
650.0000 mg | Freq: Four times a day (QID) | RECTAL | Status: DC | PRN
Start: 2017-06-27 — End: 2017-07-15
  Administered 2017-07-04 – 2017-07-07 (×4): 650 mg via RECTAL
  Filled 2017-06-27: qty 1

## 2017-06-27 NOTE — Consults (Signed)
Medical Nutrition Therapy Brief Note: TPN check    72 y.o.femalewith h/o recent PE and pancreatic cancerstatus post whipple procedure complicated by delayed gastric emptying. NGT replaced on 04/04/17. UGI on 04/12/17 concerning for obstruction just beyond the [place] in the efferent limb. Course complicated on 4/82/70 by rising LFTs and sepsis with CT concerning for cholangitis given biliary tree obstruction and PV compression due to hematoma and afferent limb distention in setting of PE treatment. Is s/p IR PTC on 04/16/17 and IR percutaneous aspiration of anterior abdominal fluid collection on 04/24/17. IR LUQ perc drain placement 3/1. IR anterior abdominal 55f perc drain placement 3/3. IR anterior perc drain into left hepatic collection 3/6.    Patient remains on TPN for nutrition support. Chart, labs, medications / TPN order, I/Os reviewed. Na 130 (L,, trending up somewhat), K+ 4.0 (WNL), Cl 93 (L, trending up somewhat), CO2 26 (WNL), Ca 8.7 (WNL), PO4 4.4 (WNL), Mg 1.7 (WNL). Recent t bili <0.2 (WNL) from 4/25. BGs ranging from 108 - 138 over the past 24 hours.  MAR reviewed with repletions noted for Mg sulfate. I/Os significant for 1850 ml UOP, 260 ml PEG output, 120 ml drain output. Current TPN order reviewed - please see recommendations below for changes.    Recommendations:  1. Continue TPN: Rate:649mhr   Volume (Amino Acid/Dextrose solution):[id number]  Amino acids:58g/L  Dextrose: 187g/L   Standard additives:  sodium 106m37m                                                  Phosphate 9 mmol/L   Maximize chloride  Insulin: 0units/LITER  Fat Emulsion 20% infusion:54gm/day (to be infused over 12 hours)  -TPN, at goal, will provide  1790kcal/day, 84g protein/day, 269g dextrose/day and 1710mL47mal fluid/day (including amino acid/dextrose solution and lipid volume).  2. Continue TPN monitoring parameters:  Includes: PO4, Mg, BMP (basic metabolic panel) daily for 3 days or until stable, then 3 times per week.  CMP (comprehensive metabolic panel), Triglycerides at baseline and weekly.   BG every 6 hours until stable on goal dextrose.  Weight at baseline, then weekly.  3.    Please consult if a more fluid-restricted TPN is desired due to low Na and Cl    Will continue to monitor.     LorynLaurence Aly PWaynesvilleer [phone]

## 2017-06-27 NOTE — Progress Notes (Signed)
Infectious Diseases (Team 3) Follow Up Note    Personally reviewed chart, labs, medications. Patient seen.     CC:  F/U VRE bacteremia, Liver and abdominal abscesses, s/p Enterobacter cloacae bacteremia, Intra-abdominal abscesses positive for VRE, enterobacter and candida glabrata    HPI:   72yo old female with pancreatic cancer, s/p Whipple procedure on 03/29/17 after neoadjuvant chemotherapy and radiation. Her post op course was c/b delayed gastric emptying, gastric distension and obstruction and a large surgical bed hematoma causing CBD and hepatic vein compression, severe biliary sepsis, s/p placement of a right hepatic duct external biliary drain placed on 2/12, found to have enterobacter cloacae bacteremia (2/12) presumed to be secondary to infection of the hematoma. However CT of abdomen (2/18) showed the hematoma had resolved but noted to have a well-circumscribed and larger anterior abdominal collection percutaneously, which was aspirated (2/19) and cultures grew enterobacter cloacae and Candida glabrata. ID service consulted on 2/21 with question of treating C.glabrata.  Caspofungin was started and a perc drain was placed LUQ 3/1, then anterior abdominal perc drain placed 3/3, and then anterior perc drain into left hepatic collection 05/08/17, the goal being source control with drain placements. She had been on Zosyn but her enterobacter became (amp-C inducible) thus switched to ertapenem and Daptomycin added on 05/04/17 for continued broad coverage.  Caspofungin was switched to oral high-dose fluconazole (74m/kg) daily once sensitivities returned on C.glabrata. Pt on methadone for pain control, QTc 409. Abdominal perc drains where removed once drainage was minimal as well as biliary tube was removed. However, there remained several communicating multiloculated fluid collections in LUQ and some areas of reaccumulation on 05/23/17 CT of a/p.   Summary of abdominal drain removals: Mid Abd on 3/17,  LUQ on 3/19,  Biliary Tube on 3/26, right lateral abd on 05/29/17.      She was switched over to oral therapy on 05/30/17 in hopes to ready her for SNF Rehab, ie, changed to Bactrim for enterobacter and to remain on high dose fluconazole for C. Glabrata, (no good option for VRE given DDI with Linezolid), with plan to reimage to fluid collections. She was initially stable on oral antibiotic regimen and ID signed off on 06/03/17.        ID Fellow team was re-consulted on 4/4 for with concern for early sepsis given patient now with rigor, lethargy, and tachycardia, though she was afebrile and no leukocytosis at that time.  A CT of her abdomen and pelvis was obtained as well as blood cultures (both on 06/06/17). CT demonstrated interval development of a new loculated 3 cm fluid collection in the right hepatic lobe, while the hepatic fluid collections remain stable in size. She also had chest CT which showed a moderate left pleural effusion (no change from prior). She as empirically restarted on ertapenem 1gm daily and daptomycin at 853mkg as she was on prior to oral regimen, and continued on fluconazole 80039maily.  She underwent a left thoracentesis on 4/5 with pigtail placement. The pleural fluid analysis c/w exudative and there were 1604 nucleated cells. No growth on cultures, pigtail removed on 06/09/17.  Blood cultures from 4/4, both periphery and Mediport grew VRE, and subsequently blood cultures on 4/6 and 4/8 were positive for VRE only Mediport.  Initially VRE bacteremia due to the pelvic abscesses, but now VRE seeded mediport which was removed on 06/10/17.   A PICC was placed on 06/13/17, once BldCx remained negative for 48 hours. SylShela Leffd a PEG placed 4/12 with  post procedure leukocytosis, this is now resolving. She has had persistent, severe pain around PEG site, palliative care and primary team have been addressing with pain medication adjustments and alternative modalities. A repeat CT of abdomen and pelvis from 4/15  demonstrated enlarging and new fluid collections c/w abscesses.  4/16: Daptomycin was increased to 10 mg/kg (rounded to 800 mg).  4/17: Patient underwent drain placements in hepatic and retrogastric fluid collections on 06/19/17.  06/21/17: CT of abdomen/pelvis showed gastric perforation and interval drainage of both perigastric and liver abscesses (drains remain in place). Patient was made NPO, on TPN for gut rest given gastric perforation.     4/17 cultures: Hepatic drainage grew 1+ VRE (S- Dapto dose dependent, Linezolid, Gentamycin), and both specimens from perigastric cultures grew C. Glabrata, sensitivities requested - not resulted yet.          Subjective: C/O "pain all over" with increased waves of pain in abdomen LUQ. Had been taking oral fluids earlier in day.  Endorses abd pain seems worse after PO intake.  Denies N/V/D, F/C, or SOB.       ROS: Reviewed 6 systems in detail, see above    Current Meds:  Scheduled Meds:   insulin lispro  0-20 Units Subcutaneous Q6H    levothyroxine IV  69 mcg Intravenous Daily    acetaminophen  650 mg Rectal Q6H    heparin (porcine)  5,000 Units Subcutaneous TID    daptomycin IV  10 mg/kg Intravenous Q24H    sodium chloride  1 g Oral TID WC    fluconazole (DIFLUCAN) IV  800 mg Intravenous Q24H    ertapenem  1,000 mg Intravenous Q24H    methylphenidate  2.5 mg Oral BID    lidocaine  1 patch Transdermal Q24H    triamterene-hydrochlorothiazide  1 tablet Oral QAM    lidocaine  1 patch Transdermal Q24H    bisacodyl  10 mg Rectal Daily     Continuous Infusions:   TPN (2 in 1) Adult Continuous      And    fat emulsion soybean oil      TPN (2 in 1) Adult Continuous 60 mL/hr at 06/27/17 1259    sodium chloride Stopped (06/24/17 1925)    HYDROmorphone       PRN Meds:.   calcium carbonate  1,000 mg Oral BID PRN    HYDROmorphone PF  0.5 mg Intravenous Q1H PRN    Or    HYDROmorphone PF  1 mg Intravenous Q1H PRN    LORazepam  0.5 mg Oral Q6H PRN    heparin lock flush   50 Units Intracatheter PRN    ondansetron  4 mg Intravenous Q6H PRN     Objective:  BP: (93-122)/(55-82)   Temp:  [35.8 C (96.4 F)-36.5 C (97.7 F)]   Temp src: Temporal (04/25 1352)  Heart Rate:  [86-104]   Resp:  [16-18]   SpO2:  [92 %-96 %]     General Appearance: Sitting in chair, grimacing and appears anxious.   HEENT: Sclera anicteric, OP clear, MMM moist  Pulm: diminished, no wheezes or rhonchi, WOB unlabored   CV: RRR in 90s, NSR (telemetry), no M/R/G  Abdomen:  Distended, soft, still quiet TTP, +bowel sounds, PEG tube (to drainage) and right and left abdominal drains present.      Extremities:WWP  Skin:Warm and dry; no rashes  Neuro: alert and oriented   Lines/Drains/Tubes: 4/9 PIV R wrist, PICC LUE site benight (4/11), PEG (4/12) to drainage  bag (bag empty at time of visit), right abdominal drain with clear bilious green fluid in collection bag, left retrogastric drainage tube with milky brownish drainage in collection bag        Recent Labs  Lab 06/27/17  0107 06/26/17  0013 06/25/17  0405   WBC 11.2* 11.1* 10.6*   Hemoglobin 7.7* 8.0* 8.8*   Hematocrit 25* 26* 28*   Platelets 449* 463* 489*   Seg Neut % 74.1 74.1 73.7   Lymphocyte % 10.6 11.0 11.3   Monocyte % 9.3 8.2 8.0   Eosinophil % 3.8 4.4 4.7       Recent Labs  Lab 06/27/17  0107 06/26/17  0013 06/25/17  0405   Sodium 130* 128* 129*   Potassium 4.0 4.0 4.3   CO2 '26 26 26   '$ UN '17 14 14   '$ Creatinine 0.51 0.51 0.48*   Glucose 126* 113* 130*   Calcium 8.7 8.5* 8.6         Lab results: 06/27/17  0414 06/24/17  0021 06/21/17  2356 06/20/17  0000 06/16/17  2332   Total Protein 5.5* 5.7* 5.4* 5.7* 5.8*   Albumin 2.0* 2.2* 2.0* 2.1* 2.1*   ALT 33 69* 91* 45* 37*   AST 29 87* 144* 65* 37*   Alk Phos 437* 540* 594* 496* 474*   Bilirubin,Total <0.2 <0.2 <0.2 <0.2 <0.2       Lab Results  Component Value Date/Time   CRP 282 (H) 06/18/2017 0102   CRP 248 (H) 06/16/2017 0218   CRP 170 (H) 06/15/2017 0206       Lab Results  Component Value Date/Time    Sedimentation Rate 54 (H) 06/18/2017 0102   Sedimentation Rate 65 (H) 06/13/2017 1807   Sedimentation Rate 69 (H) 06/09/2017 0401       Lab Results  Component Value Date/Time   CK 48 06/26/2017 0013   CK 29 06/19/2017 0020   CK 47 06/12/2017 0345       Micro:   1/25: Intraoperative bile: GS 0 pmns, no organisms, Cx: no growth  2/12: 1 set of blood cultures from the right Mediport (no peripheral bld was sent): positive for Enterobacter cloacae complex in 70 hrs, sensitive to zosyn.   2/18 abdominal GS had >25 PMNs, many GPC in pairs/chains, many GNB and cultures 4+ Enterobacter cloacae complex. The cultures also grew 1+ Candida glabrata   2/21: BCx from the Right peripheral IV, Mediport, L IJ:  No growth  05/03/17: IR-guided I&D with drain placement of the left upper quadrant collection: GS > 25 PMNs, many GNB and GN diplococci, Cultures with 4+ Enterobacter cloacae complex (resistant to Zosyn and CTX), C. Albicans and C. Glabrata, and Candida tropicalis  05/04/17:    1 set of blood cultures from left IJ: VRE at 15.3 TTP. Daptomycin sensitivities: MIC 71mg/ml (dose dependent),  Linezolid   1 set from periphery: VRE and Enterobacter cloacae at 14.8 TTP   05/05/17:  IR-guided I&D with drain placement of the intrahepatic collection: GS 1-10 PMNs, very few GPC in pairs. Fungal cultures with Candida glabrata  05/05/17: Blood cultures from periphery, Mediport and IJ: no growth  05/04/17: Urine cultures obtained (for unclear reason): no growth  05/08/17: Abscess GS: >25 PMNs, many GPC in chains, few GNB. Aerobic cultures growing 2+ Enterobacter (ertapenem, Cipro, TMP/SMX-sensitive) and 3+VRE, Fungal: C. Glabrata (sens to caspo, vori and dose dependent sens to fluconazole)  06/06/17: 2 sets of BCx (1 set each from periphery and  Mediport) - taken after restarting ertapenem: IVAD grew VRE ttp 18.2 hours,  Periphery (Left arm) grew VRE ttp 17.3.  06/07/17 Pleural fluid (left) GS 1-10 PMN's, 1-10 Nucleated white cells, No organisms seen and  1604 nucleated cells. No growth on culture.   06/07/17 Abdominal abscess: labs cancelled - no specimens received.   06/08/17: Mediport BC + for VRE in 23 hrs (R-Amp, R- tetracycline per lab, suppressed result, R- doxycycline;  S-Linezolid, S-Dapto dose dependent),  Right hand peripheral cx: No growth   06/10/17: Blood culture 2/2: 1 periphery (RHand), 1 from Mediport before removed: no growth  06/11/17: Blood Culture 2/2 (both periphery) - no growth     06/19/17:  Perigastric fluid #1:  GS 10-25 PMNs -  GPB, GNB, GPC in pairs. Fungal and AFB stains negative; 2+ Enterococcus faecium (susceps  pending)   06/19/17:  Liver fluid collection:  GS: 0 PMNs, Fungal and AFB stains negative, Cx pending  06/19/17 : Perigastric fluid #2: GS: >25 PMNs, Gram negative bacilli; Fungal and AFB stains negative, Cx pending     Imaging/Other Relevant Diagnostics:   No New Imaging    Assessment and Plan:  ID problem(s):   - s/p enterobacter cloacae bacteremia  - Multiple and extensive Intra-abdominal abscesses growing VRE, enterobacter and candida glabrata  - VRE Bacteremia     72 y.o. female with h/o recent PE and pancreatic cancer, s/p whipple procedure (goal of cure), complicated by delayed gastric emptying, elevated LFTs and sepsis, mild malnutrition 2/2 early satiety and anorexia (requiring PEG), and multiple intraabdominal and hepatic abscesses.      See HPI above for detailed history.    Lilou continues on high dose fluconazole, Daptomycin (10 mg/kg on 4/16), and Ertapenem. Her WBC cnt has bumped up slightly to 11.2, she remains afebrile.  Will monitor.  She is HDS.  Candida glabrata sensitivities pending.   Her left sided perigastric abscess drain is putting out about 15-45 cc each shift (murky brown) and right sided hepatic abscess drain about 10-20/shift (clear bilious at time of exam today). Anxiety and pain, both generalized and abdominal pain, continue to be problematic for [facility].  She experienced increase in LUQ abdominal pain today  after drinking clear liquids, we discussed holding off on po until further discussed with her primary team/Dr. Ron Agee.       No change in antibiotic plan at this time.     Recommendations:   Continue Daptomycin to '10mg'$ /kg daily (round to 800 mg) for VRE coverage, IV Ertapenem 1 gram daily for enterobacter and anaerobe coverage, and IV or PO fluconazole 800 mg daily for C.glabrata coverage. Duration TBD on clinical course and resolution of fluid collections.    Follow biweekly BMP and CBC with diff   Follow weekly CK (while on Daptomycin), liver chemistries (while on antifungal), CRP, and ESR.    Follow 4/17 fungal cultures through finalization.    ID team 3 will continue to follow along     Thank you for allowing [name] to participate in the care of this patient  Please call with questions/concerns.     Lonzo Candy, ANP-BC  Infectious Diseases, Team 3  Pager [phone]  Office Ph [phone]

## 2017-06-27 NOTE — Progress Notes (Signed)
Assumed care of pt at 1900. Q2 VSS throughout the shift. Tele NSR. PEG to gravity & bilateral perc drains flushed as ordered, minimal output. Adequate UOP. Q6 BG check, no insulin required. Pt used PCA appropriately. Pain controlled with PCA. Caps and IV tubings changed. Call bell and PCA button within reach at all times. Will continue to monitor.     Jannet Mantis, [place]

## 2017-06-27 NOTE — Plan of Care (Signed)
Bowel Elimination     Elimination patterns are normal or improving Maintaining        Fluid and Electrolyte Imbalance     Fluid and Electrolyte imbalance Maintaining        GI Bleeding Elimination     Elimination of patterns are normal or improving Maintaining        Mobility     Functional status is maintained or improved - Geriatric Maintaining     Patient's functional status is maintained or improved Maintaining        Nutrition     Patient's nutritional status is maintained or improved Maintaining     Nutritional status is maintained or improved - Geriatric Maintaining        Post-Operative Bowel Elimination     Elimination pattern is normal or improving Maintaining        Post-Operative Hemodynamic Stability     Maintain Hemodynamic Stability Maintaining        Safety     Patient will remain free of falls Maintaining          Pain/Comfort     Patient's pain or discomfort is manageable Progressing towards goal        Psychosocial     Demonstrates ability to cope with illness Progressing towards goal

## 2017-06-27 NOTE — Progress Notes (Signed)
Assumed care of pt at 0700. Pt VSS, A+O x 3, tele NSR, pain fairly well controlled with PCA (pt using PCA appropriately). Bilateral perc drains with minimal output - flushed per orders. UOP WDL, pt had a smear of a BM after suppository. Pt had small sips of clears this morning per nursing communication at that time; but as of 1145 today pt is strict NPO per orders. Pt up to chair for most of shift. Will pass along to next shift.

## 2017-06-27 NOTE — Progress Notes (Signed)
Palliative Care Progress Note    HPI: [name] w/ PMH fibromyalgia, pancreatic CA s/p Whipple for 'cure' 03/29/17 with unfortunately complicated prolonged course including ICU & intub (extub 2/17), mult IR procedures for drainage of abscesses, VRE bacteremia w/ mediport removal 4/8, s/p R pleural pigtail; s/p PICC after neg bld cx; s/p IR PEG on 4/12 w/ increased pain & FU CT & UGI w/ revealing posterior gastric perforation, s/p IR perc drain x 2 & 4/19 CT revealed oral contrast passing into lesser sac/retroperitoneum. GOC discussion w/ Dr Ron Agee, pt's dtr/HCP and Palliative w/ pt agreeing to continue full treatment in hope of returning eventually to independent living. Pt continues on BSA & bowel rest (no meds via PEG), started TPN 4/19 & now off methadone w/ pain controlled w/ Dilaudid PCA. Palliative is following for ongoing symptom management and support, available for future GOC discussions as warranted.    Subjective: "I'm in so much pain, it's about an 8/10, it hurts more after I drank the gingerale and cranberry juice. They say I'm doing better but I don't feel like it, I'm sad, overwhelmed, angry, lonely and anxious (feelings/descriptors pt identified w/ on meditation app)"    Objective: chart reviewed, pt's PCA basal rate was increased from 0.3 mg/hr to 0.6 mg/hr yesterday afternoon; pt discussed w/ bedside RN this AM - pt initially noted from door window up in recliner looking calm and not distressed & had recently reported pain to RN as 3/10, when writer visited in room ~ 10 min after she was grimacing and crying, reporting abd pain 8/10 and relayed that she has more pain after drinking - Probation officer expressed concern about pt drinking & as precaution removed drinks from bedside table while alerting RN and covering provider; Probation officer relayed that best to hear directly from Dr Ron Agee before attempting PO liquids again and could keep mouth moist w/ swabs etc. Encouraged pt to hit PCA button which did provide some  relief; pt very anxious, tried calling dtr [name] though agreed that best for pt and [name] to hear from Dr Ron Agee and in meantime would not have any further PO; spent extended time w/ pt w/ reflective listening, validated her appropriate emotions and offered to help her downloaed guided meditation app on phone; pt minimally engaged w/ writer to do 7 min guided meditation though did seem to enjoy and accept gentle massage of hands and feet; pt states she'll ask dtr to bring in her headphones so she can try doing mediation/relaxation on her own; positive reinforcement given for pt's willingness and effort to ambulate short distances w/ her walker and sit up in recliner    Palliative Care ROS: rated pain 8/10 after drinking, down to 5/10 after hitting PCA dilaudid button (pt educated to no longer take PO until hears directly from Dr Ron Agee)  Pain   Severe  Nausea   None  Anorexia   Unknown  Anxiety   Severe  Depression   Severe  Shortness of Breath   Mild  Tiredness/Fatigue   Moderate  Drowsiness/Sleepiness   Mild  Airway Secretions   no  Constipation   no  Unable to Respond   no  Delirium   pt relays getting more forgetful and harder time keeping track of days  Last stool 4/25    Physical Examination:   BP: (93-122)/(55-82)   Temp:  [35.8 C (96.4 F)-36.5 C (97.7 F)]   Temp src: Temporal (04/25 1242)  Heart Rate:  [86-104]   Resp:  [16-18]  SpO2:  [92 %-96 %]   Gen appearance: [name], sitting up in recliner, very emotional, endorses severe abd pain after drinking and settled after hitting PCA button; pt very anxious and feeling overwhelmed, offered emotional support   Lungs: easy resp, clear upper, no cough, RA  Heart: reg, tele  Abd: large, distended and tender to touch, +BS, PEG to gravity drainage (bag empty on assessment), R & L abd perc drains in place w/ drainage; voids on own, soft brown stool today  Extrem: edematous LEs  Skin: warm, thin and fragile pink flaky dry skin on LEs, PIV in  R hand, L arm PICC  Neuro: A+O x 3, emotionally labile, very anxious esp when having increased pain -responded well to reflective listening and emotional support    Assessment/Plan: [name] w/ pancreatic CA s/p Whipple in Jan w/ prolonged complicated hosp course, currently on BSA for VRE bacteremia, s/p PEG w/ CT revealing post wall gastric perf & abd abscesses w/ drains in place, bowel rest w/ TPN and Dilaudid PCA for pain management.    Pain/dyspnea  S/p methadone (last IV dose was 4/21, takes 3-7 days to wean out of system)  Dilaudid PCA 0.6 mg/hr + 0.4 mg IV Q 20 min prn (received total 19 mg from 4/24 6 am to 4/25 6 am) noted slight increase from previous day when used total 16.4 mg from 4/23 6 am to 4/24 6 am; pt reporting increased pain when taking PO liquid today -anticipate pt will have less pain/irritation w/ strict NPO status & did had good effect when did hit PCA button so will continue dose as currently ordered for now, may need increase as methadone continues to wean out of her system  Dilaudid 1 mg IV Q 2 hrs prn available for RN to give above PCA if warranted (last received x 1 on 4/19)   Lidocaine patch daily x 2 (abd, LUE)  Acetaminophen 650 mg pr Q 4 hrs prn fever    Anxiety/agitation/nausea/fatigue  S/p duloxetine (last dose 4/15), stopped for bowel rest  Levothyroxine 69 mcg IV daily (started IV 4/23, last TSH checked 12/14/16 was 2.87)  Ativan 0.25 mg IV Q 6 hrs prn (last x 1 on 4/23)  Ondansetron 4 mg IV Q 6 hrs prn (last x 1 on 4/23)  Promethazine 12.5 mg IV Q 6 hrs prn (not used, caution for sedation)  Encouraged complementary adjuvant therapies for coping, meditation app, aromatherapy, gentle massage     Prevention of opioid induced constipation, last stool 4/25  Bisacodyl supp pr daily     Dry flaky edematous Skin  Aquaphor prn   Eucerin prn for BLEs    GOC/prognosis  Full code  Dtr Rod Mae is HCP (very supportive and informed, former [facility] RN)  Goal currently is to prioritize pt's  symptom control while she is on bowel rest as dispo plans on hold   Pt vacillates at times between wanting more comfort oriented approach & continuing w/ current treatment plan in hopes of eventually making recovery back to independent  living     Pt case discussed w/ bedside RN, [employer] PA, Lavell Luster SW and Lonzo Candy NP/ID.     Thank you for allowing [name] to participate in pt's care. We will continue to follow along, please call if questions/concerns.    Total Time Spent 50 minutes:   >50% of time was spent in counseling and/or coordination of care.     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    Hypothyroidism E03.9    Pancreatic cancer C25.9    Anemia D64.9     hx of Pulmonary embolism I26.99    Depression F32.9    Sepsis A41.9       No Known Allergies (drug, envir, food or latex)    Scheduled Meds:    insulin lispro  0-20 Units Subcutaneous Q6H    levothyroxine IV  69 mcg Intravenous Daily    acetaminophen  650 mg Rectal Q6H    heparin (porcine)  5,000 Units Subcutaneous TID    daptomycin IV  10 mg/kg Intravenous Q24H    sodium chloride  1 g Oral TID WC    fluconazole (DIFLUCAN) IV  800 mg Intravenous Q24H    ertapenem  1,000 mg Intravenous Q24H    methylphenidate  2.5 mg Oral BID    lidocaine  1 patch Transdermal Q24H    triamterene-hydrochlorothiazide  1 tablet Oral QAM    lidocaine  1 patch Transdermal Q24H    bisacodyl  10 mg Rectal Daily       Continuous Infusions:    TPN (2 in 1) Adult Continuous      And    fat emulsion soybean oil      TPN (2 in 1) Adult Continuous 60 mL/hr at 06/27/17 1259    sodium chloride Stopped (06/24/17 1925)    HYDROmorphone         PRN Meds:  LORazepam, HYDROmorphone PF, Nursing  communication- Give 4 OZ of fruit juice for BG < 70 mg/dl **AND** dextrose **AND** dextrose **AND** glucagon **AND** POCT glucose, naloxone, mineral oil-hydrophilic petrolatum, promethazine, sodium chloride, sodium chloride, sodium chloride, dextrose, sodium chloride, dextrose, ondansetron

## 2017-06-27 NOTE — Plan of Care (Signed)
Bowel Elimination     Elimination patterns are normal or improving Maintaining        Fluid and Electrolyte Imbalance     Fluid and Electrolyte imbalance Maintaining        GI Bleeding Elimination     Elimination of patterns are normal or improving Maintaining        Mobility     Functional status is maintained or improved - Geriatric Maintaining     Patient's functional status is maintained or improved Maintaining        Nutrition     Patient's nutritional status is maintained or improved Maintaining     Nutritional status is maintained or improved - Geriatric Maintaining        Pain/Comfort     Patient's pain or discomfort is manageable Maintaining        Post-Operative Bowel Elimination     Elimination pattern is normal or improving Maintaining        Post-Operative Hemodynamic Stability     Maintain Hemodynamic Stability Maintaining        Safety     Patient will remain free of falls Maintaining          Psychosocial     Demonstrates ability to cope with illness Progressing towards goal

## 2017-06-27 NOTE — Progress Notes (Signed)
Surgical Oncology/Hepatobiliary Surgery Progress Note     LOS: 90 days     Subjective:  Interval Events:   No acute events overnight. States her pain is slightly improved this morning. Got out of bed to chair yesterday    Objective:  Vitals Sign Ranges for Past 24 Hours:  BP: (93-122)/(55-82)   Temp:  [35.8 C (96.4 F)-36.5 C (97.7 F)]   Temp src: Temporal (04/25 0735)  Heart Rate:  [86-104]   Resp:  [16-20]   SpO2:  [92 %-100 %]     Physical Exam:  General Appearance: Resting in bed, in NAD  Cardiac: regular rate  Respiratory: non-labored breathing on room air  Abdomen: Soft, non distended, G tube site c/d/i, tender surrounding tube. Perc site in hepatic abscess with bile tinged purulent material. Perc drain in abdominal collection with brownish output. PEG to gravity  Extremities: warm, well perfused    Labs:    CBC:    Recent Labs  Lab 06/27/17  0107 06/26/17  0013 06/25/17  0405 06/24/17  0021 06/23/17  0428 06/21/17  2356   WBC 11.2* 11.1* 10.6* 8.6 9.3 8.8   Hemoglobin 7.7* 8.0* 8.8* 8.0* 7.9* 7.6*   Hematocrit 25* 26* 28* 26* 26* 25*   Platelets 449* 463* 489* 512* 508* 366*       Metabolic Panel:    Recent Labs  Lab 06/27/17  0107 06/26/17  0013 06/25/17  0405 06/24/17  0021 06/23/17  0428 06/21/17  2356   Sodium 130* 128* 129* 133 133 135   Potassium 4.0 4.0 4.3 3.9 3.9 3.6   Chloride 93* 91* 92* 95* 96 100   CO2 '26 26 26 26 27 26   ' UN '17 14 14 11 10 ' 4*   Creatinine 0.51 0.51 0.48* 0.41* 0.46* 0.48*   Glucose 126* 113* 130* 113* 139* 129*   Calcium 8.7 8.5* 8.6 8.1* 8.1* 8.1*   Magnesium 1.7 1.9 1.6 1.9 1.5*  --    Phosphorus 4.4 4.9* 4.6* 4.1 4.1  --           Assessment:  72 y.o. female with h/o recent PE and pancreatic cancer POD # 90  status post whipple procedure complicated by delayed gastric emptying.  NGT replaced on 04/04/17. UGI on 04/12/17 concerning for obstruction just beyond the Tiger in the efferent limb.  Course complicated on 2/94/76 by rising LFTs and sepsis with CT concerning for cholangitis  given biliary tree obstruction and PV compression due to hematoma and afferent limb distention in setting of PE treatment. Is s/p IR PTC on 04/16/17 and IR percutaneous aspiration of anterior abdominal fluid collection on 04/24/17. IR LUQ perc drain placement 3/1. IR anterior abdominal 83f perc drain placement 3/3. IR anterior perc drain into left hepatic collection 3/6.    Plan:  - NPO, IVF  - TPN  - Appreciate palliative care recommendations. Multimodal pain management  - Continue IV antibiotic regimen per ID Recommendations.  - Monitor perc drain output  - Anemia; stable.   - Aggressive bowel regimen  - SCDs, SQ heparin   - Dispo: Pending clinical course, resolution of acute infection and gastric perforation. Will eventually require SNF     CCherylann Banas MD  06/27/2017     9:13 AM   General Surgery Resident

## 2017-06-28 ENCOUNTER — Inpatient Hospital Stay: Payer: Medicare (Managed Care)

## 2017-06-28 LAB — CBC AND DIFFERENTIAL
Baso # K/uL: 0.1 10*3/uL (ref 0.0–0.1)
Basophil %: 1.2 %
Eos # K/uL: 0.5 10*3/uL — ABNORMAL HIGH (ref 0.0–0.4)
Eosinophil %: 4.2 %
Hematocrit: 25 % — ABNORMAL LOW (ref 34–45)
Hemoglobin: 8 g/dL — ABNORMAL LOW (ref 11.2–15.7)
IMM Granulocytes #: 0.2 10*3/uL
IMM Granulocytes: 1.4 %
Lymph # K/uL: 1.2 10*3/uL (ref 1.2–3.7)
Lymphocyte %: 11.1 %
MCH: 29 pg/cell (ref 26–32)
MCHC: 32 g/dL (ref 32–36)
MCV: 89 fL (ref 79–95)
Mono # K/uL: 1 10*3/uL — ABNORMAL HIGH (ref 0.2–0.9)
Monocyte %: 8.9 %
Neut # K/uL: 7.9 10*3/uL — ABNORMAL HIGH (ref 1.6–6.1)
Nucl RBC # K/uL: 0 10*3/uL (ref 0.0–0.0)
Nucl RBC %: 0.1 /100 WBC (ref 0.0–0.2)
Platelets: 470 10*3/uL — ABNORMAL HIGH (ref 160–370)
RBC: 2.8 MIL/uL — ABNORMAL LOW (ref 3.9–5.2)
RDW: 17 % — ABNORMAL HIGH (ref 11.7–14.4)
Seg Neut %: 73.2 %
WBC: 10.8 10*3/uL — ABNORMAL HIGH (ref 4.0–10.0)

## 2017-06-28 LAB — PHOSPHORUS: Phosphorus: 4.2 mg/dL (ref 2.7–4.5)

## 2017-06-28 LAB — BASIC METABOLIC PANEL
Anion Gap: 13 (ref 7–16)
CO2: 24 mmol/L (ref 20–28)
Calcium: 8.9 mg/dL (ref 8.6–10.2)
Chloride: 96 mmol/L (ref 96–108)
Creatinine: 0.51 mg/dL (ref 0.51–0.95)
GFR,Black: 112 *
GFR,Caucasian: 97 *
Glucose: 114 mg/dL — ABNORMAL HIGH (ref 60–99)
Lab: 18 mg/dL (ref 6–20)
Potassium: 4 mmol/L (ref 3.3–5.1)
Sodium: 133 mmol/L (ref 133–145)

## 2017-06-28 LAB — POCT GLUCOSE
Glucose POCT: 121 mg/dL — ABNORMAL HIGH (ref 60–99)
Glucose POCT: 121 mg/dL — ABNORMAL HIGH (ref 60–99)
Glucose POCT: 125 mg/dL — ABNORMAL HIGH (ref 60–99)
Glucose POCT: 128 mg/dL — ABNORMAL HIGH (ref 60–99)
Glucose POCT: 140 mg/dL — ABNORMAL HIGH (ref 60–99)

## 2017-06-28 LAB — CRP: CRP: 125 mg/L — ABNORMAL HIGH (ref 0–10)

## 2017-06-28 LAB — MAGNESIUM: Magnesium: 1.7 mg/dL (ref 1.6–2.5)

## 2017-06-28 LAB — SEDIMENTATION RATE, AUTOMATED: Sedimentation Rate: 63 mm/hr — ABNORMAL HIGH (ref 0–30)

## 2017-06-28 MED ORDER — MAGNESIUM SULFATE 2 GM IN 50 ML *WRAPPED*
2000.0000 mg | Freq: Once | INTRAVENOUS | Status: AC
Start: 2017-06-28 — End: 2017-06-28
  Administered 2017-06-28: 2000 mg via INTRAVENOUS
  Filled 2017-06-28: qty 50

## 2017-06-28 MED ORDER — FAT EMULSION SOYBEAN OIL 20 % IV EMUL *WRAPPED*
54.0000 g | Freq: Every day | INTRAVENOUS | Status: AC
Start: 2017-06-28 — End: 2017-06-29
  Administered 2017-06-28: 54 g via INTRAVENOUS
  Filled 2017-06-28: qty 270

## 2017-06-28 MED ORDER — STERILE WATER FOR INJECTION (TPN USE ONLY) *I*
INTRAVENOUS | Status: AC
Start: 2017-06-28 — End: 2017-06-29
  Filled 2017-06-28: qty 563.2

## 2017-06-28 MED ORDER — STERILE WATER FOR IRRIGATION IR SOLN *I*
450.0000 mL | Freq: Once | Status: AC
Start: 2017-06-28 — End: 2017-06-28
  Administered 2017-06-28: 120 mL via ORAL

## 2017-06-28 MED ORDER — SODIUM CHLORIDE 0.9 % IV SOLN WRAPPED *I*
70.0000 mg | Freq: Once | INTRAVENOUS | Status: AC
Start: 2017-06-28 — End: 2017-06-28
  Administered 2017-06-28: 70 mg via INTRAVENOUS
  Filled 2017-06-28: qty 10

## 2017-06-28 MED ORDER — CASPOFUNGIN 50 MG IN 110 ML NS MINI-BAG (0.5 MG/ML) *I*
50.0000 mg | INTRAVENOUS | Status: DC
Start: 2017-06-29 — End: 2017-07-24
  Administered 2017-06-29 – 2017-07-24 (×26): 50 mg via INTRAVENOUS
  Filled 2017-06-28 (×29): qty 100

## 2017-06-28 NOTE — Progress Notes (Signed)
Report Given   Handoff received from [facility] on unit [facility]. Aware of patient coming to [facility] for CT of abdomen today.

## 2017-06-28 NOTE — Plan of Care (Signed)
Problem: Safety  Goal: Patient will remain free of falls  Outcome: Progressing towards goal  No falls this shift.    Problem: Pain/Comfort  Goal: Patient's pain or discomfort is manageable  Outcome: Progressing towards goal  Pt remains on PCA infusing as ordered. Pt reported increased pain to her LUQ over her PEG tube site. Pt using PCA appropriately. PRN dilaudid administered x1 for additional pain relief after CT scan was completed.    Problem: Mobility  Goal: Functional status is maintained or improved - Geriatric  Outcome: Progressing towards goal  Pt stand pivoted to commode and chair today, tolerated moving well however pt required a significant amount of coaching and reassurance prior to moving and during moving as she was very unsure of herself and her ability.    Problem: Nutrition  Goal: Nutritional status is maintained or improved - Geriatric  Outcome: Progressing towards goal  Pt remains NPO with TPN infusing as ordered.    Problem: Post-Operative Hemodynamic Stability  Goal: Maintain Hemodynamic Stability  Outcome: Progressing towards goal      Problem: Post-Operative Bowel Elimination  Goal: Elimination pattern is normal or improving  Outcome: Progressing towards goal  Pt had one small loose bowel movement this shift.    Problem: Psychosocial  Goal: Demonstrates ability to cope with illness  Outcome: Progressing towards goal  Pt was intermittently tearful and anxious throughout shift. PRN Ativan administered as needed with some improvement.    Problem: Fluid and Electrolyte Imbalance  Goal: Fluid and Electrolyte imbalance  Outcome: Progressing towards goal      Problem: GI Bleeding Elimination  Goal: Elimination of patterns are normal or improving  Outcome: Progressing towards goal

## 2017-06-28 NOTE — Plan of Care (Signed)
Fluid and Electrolyte Imbalance     Fluid and Electrolyte imbalance Maintaining        GI Bleeding Elimination     Elimination of patterns are normal or improving Maintaining        Mobility     Functional status is maintained or improved - Geriatric Maintaining        Nutrition     Patient's nutritional status is maintained or improved Maintaining     Nutritional status is maintained or improved - Geriatric Maintaining        Post-Operative Hemodynamic Stability     Maintain Hemodynamic Stability Maintaining        Psychosocial     Demonstrates ability to cope with illness Maintaining        Safety     Patient will remain free of falls Maintaining          Bowel Elimination     Elimination patterns are normal or improving Progressing towards goal        Mobility     Patient's functional status is maintained or improved Progressing towards goal        Pain/Comfort     Patient's pain or discomfort is manageable Progressing towards goal        Post-Operative Bowel Elimination     Elimination pattern is normal or improving Progressing towards goal

## 2017-06-28 NOTE — Progress Notes (Deleted)
Primary team aware of patient allergy to contrast dye. She is being premedicated with IV benadryl and Solu-medrol per radiologist recommendations. We will monitor post procedure for adverse reactions.    Jeanene Erb, NP

## 2017-06-28 NOTE — Progress Notes (Signed)
Palliative Care Progress Note    HPI: [name] w/ PMH fibromyalgia, pancreatic CA s/p Whipple for 'cure' 03/29/17 with unfortunately complicated prolonged course including ICU & intub (extub 2/17), mult IR procedures for drainage of abscesses, VRE bacteremia w/ mediport removal 4/8, s/p R pleural pigtail; s/p PICC after neg bld cx; s/p IR PEG on 4/12 w/ increased pain & FU CT & UGI w/ revealing posterior gastric perforation, s/p IR perc drain x 2 & 4/19 CT revealed oral contrast passing into lesser sac/retroperitoneum. GOC discussion w/ Dr Ron Agee, pt's dtr/HCP and Palliative w/ pt agreeing to continue full treatment in hope of returning eventually to independent living. Pt continues on BSA & bowel rest (no meds via PEG), started TPN 4/19 & now off methadone w/ pain controlled w/ Dilaudid PCA. Palliative is following for ongoing symptom management and support, available for future GOC discussions as warranted.    Subjective: "I don't feel well. My side hurts (LUQ), pain severe.. Pain 4/10 (after hit PCA button"    Objective: chart reviewed, pt's dilaudid PCA use has increased by ~ 25% over last 24 hrs compared w/ previous day w/ 25.9 mg/d or ~ 1 mg/hr average use, she was sleepy sitting up in recliner when visited, intermittently tearful and endorsing feeling anxious and lonely, pt was able to distract self w/ talking, watching HGTV program and filing nails; she reports less pain after pillows placed under arms and received dilaudid prn from PCA; pt anticipated to have CT of abd today, states she hasn't spoken w/ Dr Ron Agee recently; Probation officer reiterated that pt may need further increase in her dilaudid PCA dose over next few days as her methadone weans out of system; recommending increase in PRN dose from 0.4 to 0.6 mg today and will have my PC colleagues check on over [place] to determine if she would benefit from further increase; reiterated to pt that we would monitor her level of sedation/arousal to determine if safe to  increase basal rate    Palliative Care ROS: rated LUQ abd pain 4/10 after after hitting PCA dilaudid button   Pain   Moderate  Nausea   None  Anorexia   Unknown  Anxiety   Moderate  Depression   Severe  Shortness of Breath   Mild  Tiredness/Fatigue   Moderate  Drowsiness/Sleepiness   Mild  Airway Secretions   no  Constipation   no  Unable to Respond   no  Delirium   more forgetful but oriented at present  Last stool 4/25    Physical Examination:   BP: (92-117)/(52-74)   Temp:  [35.5 C (95.9 F)-36.8 C (98.2 F)]   Temp src: Temporal (04/26 1415)  Heart Rate:  [86-101]   Resp:  [16]   SpO2:  [88 %-95 %]   Weight:  [79.8 kg (176 lb)]   Gen appearance: [name], sitting up in recliner, pt reports LUQ abd pain & anxious, offered emotional support & responded to distraction  Lungs: easy resp, no cough, RA  Heart: reg, tele  Abd: large, distended and tender to touch, +BS, PEG to gravity drainage (bag empty on assessment), R side abd perc drain (scant pale green drainage) & L side abd perc drain (scant milky white drainage); voids on own, soft brown stool today  Extrem: edematous LEs  Skin: warm, thin and fragile pink flaky dry skin on LEs, PIV in R hand, L arm PICC  Neuro: A+O x 3, ambulated w/ walker & sitting up in recliner,  more sleepy today when visited, remains emotionally labile w/ anxieyt that compounds pain perception -responded well to distractions of TV and filing nails and emotional support    Assessment/Plan: [name] w/ pancreatic CA s/p Whipple in Jan w/ prolonged complicated hosp course, currently on BSA for VRE bacteremia, s/p PEG w/ CT revealing post wall gastric perf & abd abscesses w/ drains in place, bowel rest w/ TPN and Dilaudid PCA for pain management. Pt's dilaudid use continues to trend up as her methadone is weaning out of her system, recommend conservative increase in PCA dose today & will monitor for sedation.    Pain/dyspnea  S/p methadone (last IV dose was 4/21, takes 3-7  days to wean out of system)  Dilaudid PCA 0.6 mg/hr + 0.4 mg IV Q 20 min prn (received total 25.9 mg from 4/25 6 am to 4/26 6 am) noted ~25% increase from previous day when used total 19 mg from 4/24 6 am to 4/25 6 am -recommend increasing prn dose to 0.6 mg Q 20 min (max allowed 3 mg/hr)   Dilaudid 1 mg IV Q 2 hrs prn available for RN to give above PCA if warranted (received x 1 on 4/26 at 2 pm in anticipation of abd CT)   Lidocaine patch daily x 2 (abd, LUE)  Acetaminophen 650 mg pr Q 4 hrs prn fever    Anxiety/agitation/nausea/fatigue  S/p duloxetine (last dose 4/15), stopped for bowel rest  Levothyroxine 69 mcg IV daily (started IV 4/23, last TSH checked 12/14/16 was 2.87)  Ativan 0.25 mg IV Q 6 hrs prn (x 1 on 4/25, x 1 on 4/26)  Ondansetron 4 mg IV Q 6 hrs prn (last x 1 on 4/23)  Promethazine 12.5 mg IV Q 6 hrs prn (not used, caution for sedation)  Encouraged complementary adjuvant therapies for coping, meditation app, aromatherapy, gentle massage     Prevention of opioid induced constipation, last stool 4/26  Bisacodyl supp pr daily     Dry flaky edematous Skin  Aquaphor prn   Eucerin prn for BLEs    GOC/prognosis  Full code  Dtr Rod Mae is HCP (very supportive and informed, former [facility] RN)  Goal currently is to prioritize pt's symptom control while she is on bowel rest as dispo plans on hold   Pt vacillates at times between wanting more comfort oriented approach & continuing w/ current treatment plan in hopes of eventually making recovery back to independent  living     Pt case discussed w/ bedside RN, covering provider and SW.     Thank you for allowing [name] to participate in pt's care. We will continue to follow along, I'll ask my PC colleagues to check on over [place].  Please call if questions/concerns.    Total Time Spent 40 minutes:   >50% of time was spent in counseling and/or coordination of care.     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    Hypothyroidism E03.9    Pancreatic cancer C25.9    Anemia D64.9     hx of Pulmonary embolism I26.99    Depression F32.9    Sepsis A41.9       No Known Allergies (drug, envir, food or latex)  Scheduled Meds:    [START ON 06/29/2017] caspofungin  50 mg Intravenous Q24H    insulin lispro  0-20 Units Subcutaneous Q6H    levothyroxine IV  69 mcg Intravenous Daily    heparin (porcine)  5,000 Units Subcutaneous TID    daptomycin IV  10 mg/kg Intravenous Q24H    ertapenem  1,000 mg Intravenous Q24H    lidocaine  1 patch Transdermal Q24H    lidocaine  1 patch Transdermal Q24H    bisacodyl  10 mg Rectal Daily       Continuous Infusions:    TPN (2 in 1) Adult Continuous      And    fat emulsion soybean oil      TPN (2 in 1) Adult Continuous 60 mL/hr at 06/28/17 0845    sodium chloride Stopped (06/24/17 1925)    HYDROmorphone         PRN Meds:  acetaminophen, LORazepam, HYDROmorphone PF, Nursing communication- Give 4 OZ of fruit juice for BG < 70 mg/dl **AND** dextrose **AND** dextrose **AND** glucagon **AND** POCT glucose, naloxone, mineral oil-hydrophilic petrolatum, promethazine, sodium chloride, sodium chloride, sodium chloride, dextrose, ondansetron

## 2017-06-28 NOTE — Progress Notes (Signed)
Assumed care of pt 1500-2300. Pt slightly confused at start of shift - wondering why writer did not know pt's grandson. Pt was easily reoriented and her confusion appeared to resolve throughout shift. PCA increased per orders r/t pt's increased pain this morning - pt tolerated this increase without any changes in her vital signs and reports that her pain is somewhat improved. Pt using PCA appropriately. Tele in NSR. Pt placed on 0.5 L of O2 for overnight due to a desat to 87% on room air. Pt satting in mid 90s% on 0.5 L O2 via NC. Pt currently resting comfortably in bed with bed alarm on for safety. Will pass along to next shift.

## 2017-06-28 NOTE — Progress Notes (Signed)
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  Spoke with covering provider, L. [place]oman[place] NP, rec change to Capofungin IV 70 mg loading dose on Day 1 and then 50 mg daily.      Lonzo Candy, ANP-BC  Infectious Diseases, Team 3  Pager [phone]  Office Ph [phone]

## 2017-06-28 NOTE — Progress Notes (Addendum)
Surgical Oncology/Hepatobiliary Surgery Progress Note     LOS: 91 days     Subjective:  Interval Events:   No acute events overnight. Reports that her pain is improved this morning.   Pt got out of bed to the chair yesterday. G-tube output 120cc.    Objective:  Vitals Sign Ranges for Past 24 Hours:  BP: (98-117)/(52-74)   Temp:  [35.9 C (96.6 F)-36.8 C (98.2 F)]   Temp src: Temporal (04/26 0558)  Heart Rate:  [89-102]   Resp:  [16]   SpO2:  [88 %-96 %]   Weight:  [79.8 kg (176 lb)]     Physical Exam:  General Appearance: Resting in bed, in NAD  Cardiac: regular rate  Respiratory: non-labored breathing on room air  Abdomen: Soft, non distended, G tube site c/d/i, tender surrounding tube. Perc site in hepatic abscess with bile tinged purulent material. Perc drain in abdominal collection with brownish output. PEG to gravity  Extremities: warm, well perfused    Labs:    CBC:    Recent Labs  Lab 06/28/17  0020 06/27/17  0107 06/26/17  0013 06/25/17  0405 06/24/17  0021 06/23/17  0428   WBC 10.8* 11.2* 11.1* 10.6* 8.6 9.3   Hemoglobin 8.0* 7.7* 8.0* 8.8* 8.0* 7.9*   Hematocrit 25* 25* 26* 28* 26* 26*   Platelets 470* 449* 463* 489* 512* 948*       Metabolic Panel:    Recent Labs  Lab 06/28/17  0020 06/27/17  0107 06/26/17  0013 06/25/17  0405 06/24/17  0021 06/23/17  0428   Sodium 133 130* 128* 129* 133 133   Potassium 4.0 4.0 4.0 4.3 3.9 3.9   Chloride 96 93* 91* 92* 95* 96   CO2 _0 UN _1 Creatinine 0.51 0.51 0.51 0.48* 0.41* 0.46*   Glucose 114* 126* 113* 130* 113* 139*   Calcium 8.9 8.7 8.5* 8.6 8.1* 8.1*   Magnesium 1.7 1.7 1.9 1.6 1.9 1.5*   Phosphorus 4.2 4.4 4.9* 4.6* 4.1 4.1          Assessment:  72 y.o. female with h/o recent PE and pancreatic cancer POD # 91  status post whipple procedure complicated by delayed gastric emptying.  NGT replaced on 04/04/17. UGI on 04/12/17 concerning for obstruction just beyond the [place] in the efferent limb.  Course complicated on 5/46/27 by rising  LFTs and sepsis with CT concerning for cholangitis given biliary tree obstruction and PV compression due to hematoma and afferent limb distention in setting of PE treatment. Is s/p IR PTC on 04/16/17 and IR percutaneous aspiration of anterior abdominal fluid collection on 04/24/17. IR LUQ perc drain placement 3/1. IR anterior abdominal 61f perc drain placement 3/3. IR anterior perc drain into left hepatic collection 3/6.    Plan:  - NPO, IVF with TPN for nutritional support.  - Will plan to repeat CT Scan with contrast given via G-tube this AM.  - Appreciate palliative care recommendations. Multimodal pain management  - Continue IV antibiotic regimen per ID Recommendations.  - Monitor perc drain output  - Anemia; stable.   - Aggressive bowel regimen  - SCDs, SQ heparin   - Dispo: Pending clinical course, resolution of acute infection and gastric perforation. 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   IR drain placement in retrogastric collection yesterday. Appears to be well drained.   UGI shows gastric injury with leak into contained collection.  Recent bacteremia with VRE.  Continue current antibiotics.   Hyperglycemia with insulin sliding scale and recurrent need for insulin. continue   Post operative anemia. Stable. Transfused one unit. Stable. No bleeding present.  Pulmonary embolism. Present on admission. Heparin drip once stable for treatment. Holding for procedures.   Hypoalbuminemia. Sever protein calorie malnutrition.  TPN for now as we cannot use enteral feeding due to gastric leak.   Depression. Ritalin helpful. Daily encouragement and reinforcement of goals.   Constipation . Aggressive  regimen.   Physical debility. Requires rehabilitation setting.   Pain control. Increase PCA.     Gastrostomy tube to gravity.   CT today reviewed and results discussed with paitent and family. Doing well no further evidence of gastric perforation or leakage. Will trial oral diet.

## 2017-06-28 NOTE — Progress Notes (Signed)
Nursing Progress Note:    Assumed care at 07:30. No acute events this shift. A&O x2-3 but easily reoriented, VSS. Pt c/o increased pain to her LUQ over her PEG tube insertion site. Upon assessment, abdomen appeared rounded and PEG tube insertion site was clean, dry, and intact. RN also noted that left perc drain was leaking when flushed. Jeanene Erb, NP notified. Orders placed for a CT scan. At approximately 13:15, pt was transported via bed on life pack monitor and in the presence of RN to CT. Pt returned at approximately 14:10. PRN pain medication administered in addition to PCA, as ordered, for increased pain after traveling to/from CT and repositioning during CT. Pt with no issues or complaints at this time, currently resting in bed, call light within reach. Please refer to flowsheets for complete assessment findings and Plan of Care note for further information. Report given to Eliane Decree, RN, will continue to monitor.    Ashley-Marie Jamyra Zweig, RN

## 2017-06-28 NOTE — Progress Notes (Signed)
Assumed care of pt at 1900. Pt A&O x3. Q2 VSS throughout the shift. Pt c/o of 8/10 abd pain but was not able to specify which quadrant. Pt reported the severity of pain is not new. Alvira Monday, NP notified of abd pain. Writer reinforced teaching on usage of PCA. PCA button and call bell within reach at all times. Ativan x1 per pt request for anxiety. NSR on tele. Weight obtained. Bilat perc drains flushed per order. Q6 BG, no insulin required. Voiding spontaneously, adequate UOP. Full report given to oncoming shift. Will continue to monitor.     Jannet Mantis, [place]

## 2017-06-28 NOTE — Consults (Signed)
Medical Nutrition Therapy - Follow Up    Admit Date: 03/29/2017    Patient Summary: 72 y.o.femalewith h/o recent PE and pancreatic cancerstatus post whipple procedure complicated by delayed gastric emptying. NGT replaced on 04/04/17. UGI on 04/12/17 concerning for obstruction just beyond the [place] in the efferent limb. Course complicated on 5/63/14 by rising LFTs and sepsis with CT concerning for cholangitis given biliary tree obstruction and PV compression due to hematoma and afferent limb distention in setting of PE treatment. Is s/p IR PTC on 04/16/17 and IR percutaneous aspiration of anterior abdominal fluid collection on 04/24/17. IR LUQ perc drain placement 3/1. IR anterior abdominal 3f perc drain placement 3/3. IR anterior perc drain into left hepatic collection 3/6.    Pertinent Meds: daily suppository, fluconazole, ertapenem, dilaudid, levothyroxine, SSI, Mg sulfate  Pertinent Labs: lytes WNL; BGs range 114-141    Reviewed I/O's: 1575 ml UOP, 40 ml PEG output, 80 ml drain output    Enteral or parenteral access: PICC, PEG     Food allergies: NKFA    Current diet: NPO  Supplements: none   TPN order: Adult Continuous plus Fat (central infusion site)     Rate: 60 mL/hr     Volume (Amino Acid/Dextrose solution): 1440 mL/day    Amino acids: 58 g/L     Dextrose: 187 g/L     Additives as follows:     Na: 106 mEq/L     K: 22 mEq/L     Ca: 4.5 mEq/L     Mg: 5 mEq/L     PO4: 9 mmol/L     Maximize Chloride    Insulin: 0 units/LITER   Fat Emulsion 20% infusion: 54 gm/day (to be infused over 12 hours)  Provides 1790 kcal/day, 84 g protein/day, 269 g dextrose/day and 1710 mL total fluid/day (including amino acid/dextrose solution and lipid volume).       Nutrition Focused Physical Exam:    Edema: +1 generalized, +1 abdominal, +1 BUE, +2 BLE  Abdomen: rounded, surgical scar, tenderness/guarding, incontinent, +BS, +flatus   Skin: poor turgor, abrasion to L elbow, skin tear R elbow, skin tear back    Anthropometrics:  Height:  170.2 cm (5' 7")    Current Weight: 79.8 kg (176 lb) (4/25)   Weight with minimal edema 64.9; 90% IBW; 90% UBW   Ideal Body Weight: 72.4 kg + 10% adjusted for age   BMI: 22.4 kg/(m^2) slightly underweight for age (ideal BMI 23-27 for age 103+) using BW with minimal edema   Weight Hx: weight gain likely fluid related     06/27/2017 79.833 kg 176 lbs --   05/28/2017 79.788 kg 175 lbs 14 oz Actual   05/13/2017 64.9 kg 143 lbs 1 oz --   04/09/2017 73.483 kg 162 lbs Actual   03/29/2017 72 kg 158 lbs 12 oz Actual         Estimated Nutrient Needs: (Based on 64.9 kg) *BW with minimal edema    1630-1950 kcal/day (25-30 kcal/kg)   78-87 g protein/day (1.2-1.5 g/kg)    1630-1950 mL fluid/day (25-30 mL/kg)      Nutrition Assessment and Diagnosis:   Per palliative care note, pt has been drinking fluids and expressing pain. Now strict NPO. Na and Cl now WNL, all other lytes WNL. Pt does continue to have +1/+2 edema. TPN provides 1710 ml fluid (~26 ml/kg based on estimated needs). Can try to cut back on fluids slightly, however poor turgor is noted, will try only a slight decrease  in fluid. RN reports increased pain in abdomen today.     Malnutrition Status: Pt diagnosed with moderate malnutrition 04/09/17.      Nutrition Intervention:   1.   Recommend TPN as follows: Adult Continuous plus Fat (central infusion site)     Rate: 55 mL/hr     Volume (Amino Acid/Dextrose solution): 1320 mL/day    Amino acids: 64 g/L     Dextrose: 204 g/L     Additives as follows:     Na: 106 mEq/L     K: 22 mEq/L     Ca: 4.5 mEq/L     Mg: 5 mEq/L     PO4: 9 mmol/L     Maximize Chloride    Insulin: 0 units/LITER   Fat Emulsion 20% infusion: 54 gm/day (to be infused over 12 hours)  Provides 1790 kcal/day, 84 g protein/day, 269 g dextrose/day and 1590 mL total fluid/day (including amino acid/dextrose solution and lipid volume).  2.  ContinueTPN Panel.     Includes: PO4, Mg, BMP (basic metabolic panel) daily for 3 days or until stable,  then 3 times per  week.   CMP (comprehensive metabolic panel), Triglycerides at baseline and weekly.    BG every 6 hours until stable on goal dextrose.   Weight at baseline, then weekly.  3.  Diet/ enteral advancement as medically appropriate        Nutrition Monitoring/Evaluation:   1. Will monitor nutrition-related labs, weight trend, BM pattern.   2. Will follow up per high nutrition risk protocol.    Laurence Aly, [place]  Pager [phone]

## 2017-06-29 LAB — BASIC METABOLIC PANEL
Anion Gap: 13 (ref 7–16)
CO2: 24 mmol/L (ref 20–28)
Calcium: 8.8 mg/dL (ref 8.6–10.2)
Chloride: 99 mmol/L (ref 96–108)
Creatinine: 0.49 mg/dL — ABNORMAL LOW (ref 0.51–0.95)
GFR,Black: 113 *
GFR,Caucasian: 98 *
Glucose: 114 mg/dL — ABNORMAL HIGH (ref 60–99)
Lab: 17 mg/dL (ref 6–20)
Potassium: 4 mmol/L (ref 3.3–5.1)
Sodium: 136 mmol/L (ref 133–145)

## 2017-06-29 LAB — CBC AND DIFFERENTIAL
Baso # K/uL: 0.1 10*3/uL (ref 0.0–0.1)
Basophil %: 1.6 %
Eos # K/uL: 0.5 10*3/uL — ABNORMAL HIGH (ref 0.0–0.4)
Eosinophil %: 5.3 %
Hematocrit: 23 % — ABNORMAL LOW (ref 34–45)
Hemoglobin: 7.3 g/dL — ABNORMAL LOW (ref 11.2–15.7)
IMM Granulocytes #: 0.1 10*3/uL
IMM Granulocytes: 1 %
Lymph # K/uL: 1.1 10*3/uL — ABNORMAL LOW (ref 1.2–3.7)
Lymphocyte %: 13.1 %
MCH: 29 pg/cell (ref 26–32)
MCHC: 32 g/dL (ref 32–36)
MCV: 90 fL (ref 79–95)
Mono # K/uL: 0.9 10*3/uL (ref 0.2–0.9)
Monocyte %: 10.4 %
Neut # K/uL: 5.9 10*3/uL (ref 1.6–6.1)
Nucl RBC # K/uL: 0 10*3/uL (ref 0.0–0.0)
Nucl RBC %: 0 /100 WBC (ref 0.0–0.2)
Platelets: 449 10*3/uL — ABNORMAL HIGH (ref 160–370)
RBC: 2.6 MIL/uL — ABNORMAL LOW (ref 3.9–5.2)
RDW: 17.2 % — ABNORMAL HIGH (ref 11.7–14.4)
Seg Neut %: 68.6 %
WBC: 8.6 10*3/uL (ref 4.0–10.0)

## 2017-06-29 LAB — MAGNESIUM: Magnesium: 1.7 mg/dL (ref 1.6–2.5)

## 2017-06-29 LAB — POCT GLUCOSE
Glucose POCT: 131 mg/dL — ABNORMAL HIGH (ref 60–99)
Glucose POCT: 123 mg/dL — ABNORMAL HIGH (ref 60–99)
Glucose POCT: 111 mg/dL — ABNORMAL HIGH (ref 60–99)

## 2017-06-29 LAB — PHOSPHORUS: Phosphorus: 4.9 mg/dL — ABNORMAL HIGH (ref 2.7–4.5)

## 2017-06-29 MED ORDER — FAT EMULSION SOYBEAN OIL 20 % IV EMUL *WRAPPED*
54.0000 g | Freq: Every day | INTRAVENOUS | Status: AC
Start: 2017-06-29 — End: 2017-06-30
  Administered 2017-06-29: 54 g via INTRAVENOUS
  Filled 2017-06-29: qty 270

## 2017-06-29 MED ORDER — STERILE WATER FOR INJECTION (TPN USE ONLY) *I*
INTRAVENOUS | Status: AC
Start: 2017-06-29 — End: 2017-06-30
  Filled 2017-06-29: qty 563.2

## 2017-06-29 NOTE — Progress Notes (Signed)
Assumed care of pt at 2300. Pt A&O x3 and Q2 VSS. Pt stated pain management is better today compared to yesterday. NSR on tele. Pt remained on 0.5L NC throughout the shift. Ball bell and PCA button within reach at all times. Bilat perc drains flushed as ordered. Perc drain sites still leaking, dressings changed. Alvira Monday, NP notified of leaking at drain sites. UOP WDL, voiding spontaneously. Pt slept through most of the shift. Will continue to monitor.

## 2017-06-29 NOTE — Progress Notes (Signed)
Assumed care of pt 1500-2300. At start of shift, pt in visible pain and tearful. Pt's daughter [name] also upset that PCA had been decreased earlier in the day. Jacelyn Grip, MD and Dallas Breeding, MD notified and PCA was increased - pt appeared slightly more comfortable and rated her pain better after this increase - however she is still having intermittent severe pain at her PEG site.     Pt was started on clears this shift and is tolerating them well. Pt VSS, A+O x 3, UOP WDL, OOB to chair for about 4 hours today. Perc drains flushed per orders - continued leakage at site with flushing of L perc. Minimal output from perc drains; output from PEG just looks like juice that pt drank. Will pass along to next shift

## 2017-06-29 NOTE — Plan of Care (Signed)
Bowel Elimination     Elimination patterns are normal or improving Maintaining        Fluid and Electrolyte Imbalance     Fluid and Electrolyte imbalance Maintaining        GI Bleeding Elimination     Elimination of patterns are normal or improving Maintaining        Mobility     Functional status is maintained or improved - Geriatric Maintaining     Patient's functional status is maintained or improved Maintaining        Nutrition     Patient's nutritional status is maintained or improved Maintaining     Nutritional status is maintained or improved - Geriatric Maintaining        Pain/Comfort     Patient's pain or discomfort is manageable Maintaining        Post-Operative Bowel Elimination     Elimination pattern is normal or improving Maintaining        Post-Operative Hemodynamic Stability     Maintain Hemodynamic Stability Maintaining        Psychosocial     Demonstrates ability to cope with illness Maintaining        Safety     Patient will remain free of falls Maintaining

## 2017-06-29 NOTE — Progress Notes (Signed)
Surgical Oncology/Hepatobiliary Surgery Progress Note     LOS: 92 days     Subjective:  Interval Events:   No acute events overnight. Reports that her pain is stable  CT yesterday without evidence of oral contrast extravasation. Started on clears    Objective:  Vitals Sign Ranges for Past 24 Hours:  BP: (93-137)/(53-82)   Temp:  [35.9 C (96.6 F)-36.4 C (97.5 F)]   Temp src: Temporal (04/27 0804)  Heart Rate:  [85-102]   Resp:  [16-20]   SpO2:  [87 %-96 %]     Physical Exam:  General Appearance: Resting in bed, in NAD  Cardiac: regular rate  Respiratory: non-labored breathing on room air  Abdomen: Soft, non distended, G tube site c/d/i, tender surrounding tube. Perc site in hepatic abscess with bile tinged purulent material. Perc drain in abdominal collection with milky output. PEG to gravity with minimal output  Extremities: warm, well perfused    Labs:    CBC:    Recent Labs  Lab 06/29/17  0000 06/28/17  0020 06/27/17  0107 06/26/17  0013 06/25/17  0405 06/24/17  0021   WBC 8.6 10.8* 11.2* 11.1* 10.6* 8.6   Hemoglobin 7.3* 8.0* 7.7* 8.0* 8.8* 8.0*   Hematocrit 23* 25* 25* 26* 28* 26*   Platelets 449* 470* 449* 463* 489* 161*       Metabolic Panel:    Recent Labs  Lab 06/29/17  0000 06/28/17  0020 06/27/17  0107 06/26/17  0013 06/25/17  0405 06/24/17  0021   Sodium 136 133 130* 128* 129* 133   Potassium 4.0 4.0 4.0 4.0 4.3 3.9   Chloride 99 96 93* 91* 92* 95*   CO2 '24 24 26 26 26 26   ' UN '17 18 17 14 14 11   ' Creatinine 0.49* 0.51 0.51 0.51 0.48* 0.41*   Glucose 114* 114* 126* 113* 130* 113*   Calcium 8.8 8.9 8.7 8.5* 8.6 8.1*   Magnesium 1.7 1.7 1.7 1.9 1.6 1.9   Phosphorus 4.9* 4.2 4.4 4.9* 4.6* 4.1          Assessment:  72 y.o. female with h/o recent PE and pancreatic cancer POD # 56  status post whipple procedure complicated by delayed gastric emptying.  NGT replaced on 04/04/17. UGI on 04/12/17 concerning for obstruction just beyond the [place] in the efferent limb.  Course complicated on 0/96/04 by rising LFTs and  sepsis with CT concerning for cholangitis given biliary tree obstruction and PV compression due to hematoma and afferent limb distention in setting of PE treatment. Is s/p IR PTC on 04/16/17 and IR percutaneous aspiration of anterior abdominal fluid collection on 04/24/17. IR LUQ perc drain placement 3/1. IR anterior abdominal 62f perc drain placement 3/3. IR anterior perc drain into left hepatic collection 3/6.    Plan:  - NPO, PN for nutritional support.  - CT on 4/27 without evidence of oral contrast extravasation. Advance to clears for comfort. G-tube to gravity  - Appreciate palliative care recommendations. Multimodal pain management. Decrease basal dose PCA today  - Continue IV antibiotic regimen per ID Recommendations.  - Monitor perc drain outputs.   - Anemia; stable.   - Aggressive bowel regimen  - SCDs, SQ heparin   - Dispo: Pending clinical course, resolution of acute infection and gastric perforation. Will eventually require SNF     JBelinda Fisher MD  06/29/2017     9:05 AM   General Surgery Resident

## 2017-06-30 LAB — BASIC METABOLIC PANEL
Anion Gap: 13 (ref 7–16)
CO2: 25 mmol/L (ref 20–28)
Calcium: 9 mg/dL (ref 8.6–10.2)
Chloride: 97 mmol/L (ref 96–108)
Creatinine: 0.51 mg/dL (ref 0.51–0.95)
GFR,Black: 112 *
GFR,Caucasian: 97 *
Glucose: 123 mg/dL — ABNORMAL HIGH (ref 60–99)
Lab: 16 mg/dL (ref 6–20)
Potassium: 3.7 mmol/L (ref 3.3–5.1)
Sodium: 135 mmol/L (ref 133–145)

## 2017-06-30 LAB — POCT GLUCOSE
Glucose POCT: 122 mg/dL — ABNORMAL HIGH (ref 60–99)
Glucose POCT: 137 mg/dL — ABNORMAL HIGH (ref 60–99)
Glucose POCT: 135 mg/dL — ABNORMAL HIGH (ref 60–99)
Glucose POCT: 133 mg/dL — ABNORMAL HIGH (ref 60–99)

## 2017-06-30 LAB — CBC AND DIFFERENTIAL
Baso # K/uL: 0.1 10*3/uL (ref 0.0–0.1)
Basophil %: 1.2 %
Eos # K/uL: 0.5 10*3/uL — ABNORMAL HIGH (ref 0.0–0.4)
Eosinophil %: 6 %
Hematocrit: 25 % — ABNORMAL LOW (ref 34–45)
Hemoglobin: 7.9 g/dL — ABNORMAL LOW (ref 11.2–15.7)
IMM Granulocytes #: 0.1 10*3/uL
IMM Granulocytes: 0.7 %
Lymph # K/uL: 1.3 10*3/uL (ref 1.2–3.7)
Lymphocyte %: 15.4 %
MCH: 28 pg/cell (ref 26–32)
MCHC: 31 g/dL — ABNORMAL LOW (ref 32–36)
MCV: 89 fL (ref 79–95)
Mono # K/uL: 0.8 10*3/uL (ref 0.2–0.9)
Monocyte %: 9.7 %
Neut # K/uL: 5.4 10*3/uL (ref 1.6–6.1)
Nucl RBC # K/uL: 0 10*3/uL (ref 0.0–0.0)
Nucl RBC %: 0 /100 WBC (ref 0.0–0.2)
Platelets: 470 10*3/uL — ABNORMAL HIGH (ref 160–370)
RBC: 2.8 MIL/uL — ABNORMAL LOW (ref 3.9–5.2)
RDW: 17 % — ABNORMAL HIGH (ref 11.7–14.4)
Seg Neut %: 67 %
WBC: 8.1 10*3/uL (ref 4.0–10.0)

## 2017-06-30 LAB — MAGNESIUM: Magnesium: 1.6 mg/dL (ref 1.6–2.5)

## 2017-06-30 LAB — PHOSPHORUS: Phosphorus: 4.9 mg/dL — ABNORMAL HIGH (ref 2.7–4.5)

## 2017-06-30 MED ORDER — FAT EMULSION SOYBEAN OIL 20 % IV EMUL *WRAPPED*
54.0000 g | Freq: Every day | INTRAVENOUS | Status: AC
Start: 2017-06-30 — End: 2017-07-01
  Administered 2017-06-30: 54 g via INTRAVENOUS
  Filled 2017-06-30: qty 270

## 2017-06-30 MED ORDER — STERILE WATER FOR INJECTION (TPN USE ONLY) *I*
INTRAVENOUS | Status: AC
Start: 2017-06-30 — End: 2017-07-01
  Filled 2017-06-30: qty 563.2

## 2017-06-30 NOTE — Plan of Care (Signed)
Problem: Safety  Goal: Patient will remain free of falls  Outcome: Maintaining      Problem: Pain/Comfort  Goal: Patient's pain or discomfort is manageable  Outcome: Progressing towards goal      Problem: Mobility  Goal: Functional status is maintained or improved - Geriatric  Outcome: Progressing towards goal    Goal: Patient's functional status is maintained or improved  Outcome: Progressing towards goal      Problem: Nutrition  Goal: Patient's nutritional status is maintained or improved  Outcome: Progressing towards goal    Goal: Nutritional status is maintained or improved - Geriatric  Outcome: Progressing towards goal      Problem: Bowel Elimination  Goal: Elimination patterns are normal or improving  Outcome: Progressing towards goal      Problem: Post-Operative Hemodynamic Stability  Goal: Maintain Hemodynamic Stability  Outcome: Maintaining      Problem: Fluid and Electrolyte Imbalance  Goal: Fluid and Electrolyte imbalance  Outcome: Progressing towards goal

## 2017-06-30 NOTE — Progress Notes (Signed)
Assumed care @ 0700. VSS,done Q2 throughout shift. NSR on tele. Complaining of pain in abdomen during shift. PRN Dilaudid PCA available and used appropriately with moderate effect. Repositioned for additional comfort. Ambulated 1X in hallway, tolerated well. IS encouraged throughout shift 10X every hour. CLD per order, not eating much. Will continue to encourage PO intake. Insertion sites of perc drains continue to leak with flushes, team aware. Currently resting in chair with call bell within reach.     Lamar Sprinkles, RN

## 2017-06-30 NOTE — Progress Notes (Signed)
Surgical Oncology/Hepatobiliary Surgery Progress Note     LOS: 93 days     Subjective:  Interval Events:     No acute events overnight.  Tolerating clears without increase in LUQ perc drain output    Objective:  Vitals Sign Ranges for Past 24 Hours:  BP: (92-141)/(50-63)   Temp:  [36 C (96.8 F)-36.9 C (98.4 F)]   Temp src: (P) Temporal (04/28 0810)  Heart Rate:  [85-99]   Resp:  [16-20]   SpO2:  [91 %-98 %]     Physical Exam:  General Appearance: Resting in bed, in NAD  Cardiac: regular rate  Respiratory: non-labored breathing on room air  Abdomen: Soft, non distended, G tube site c/d/i, tender surrounding tube. Perc site in hepatic abscess with bile tinged purulent material. Perc drain in abdominal collection with minimal milky output. PEG to gravity with clear liquids/grape juice in bag  Extremities: warm, well perfused    Labs:    CBC:    Recent Labs  Lab 06/30/17  0100 06/29/17  0000 06/28/17  0020 06/27/17  0107 06/26/17  0013 06/25/17  0405   WBC 8.1 8.6 10.8* 11.2* 11.1* 10.6*   Hemoglobin 7.9* 7.3* 8.0* 7.7* 8.0* 8.8*   Hematocrit 25* 23* 25* 25* 26* 28*   Platelets 470* 449* 470* 449* 463* 144*       Metabolic Panel:    Recent Labs  Lab 06/30/17  0100 06/29/17  0000 06/28/17  0020 06/27/17  0107 06/26/17  0013 06/25/17  0405   Sodium 135 136 133 130* 128* 129*   Potassium 3.7 4.0 4.0 4.0 4.0 4.3   Chloride 97 99 96 93* 91* 92*   CO2 '25 24 24 26 26 26   '$ UN '16 17 18 17 14 14   '$ Creatinine 0.51 0.49* 0.51 0.51 0.51 0.48*   Glucose 123* 114* 114* 126* 113* 130*   Calcium 9.0 8.8 8.9 8.7 8.5* 8.6   Magnesium 1.6 1.7 1.7 1.7 1.9 1.6   Phosphorus 4.9* 4.9* 4.2 4.4 4.9* 4.6*          Assessment:  72 y.o. female with h/o recent PE and pancreatic cancer POD # 3  status post whipple procedure complicated by delayed gastric emptying.  NGT replaced on 04/04/17. UGI on 04/12/17 concerning for obstruction just beyond the [place] in the efferent limb.  Course complicated on 05/18/38 by rising LFTs and sepsis with CT concerning  for cholangitis given biliary tree obstruction and PV compression due to hematoma and afferent limb distention in setting of PE treatment. Is s/p IR PTC on 04/16/17 and IR percutaneous aspiration of anterior abdominal fluid collection on 04/24/17. IR LUQ perc drain placement 3/1. IR anterior abdominal 109f perc drain placement 3/3. IR anterior perc drain into left hepatic collection 3/6.    Plan:  - NPO, TPN for nutritional support.  - CT on 4/27 without evidence of oral contrast extravasation. Cont clears for comfort. G-tube to gravity. Will plan on G-tube clamp tmrw  - Appreciate palliative care recommendations. Multimodal pain management. Did not tolerate decrease in dPCA dose. Cont current rate for now  - Continue IV antibiotic regimen per ID Recommendations.  - Monitor perc drain outputs.   - Anemia; stable.   - Aggressive bowel regimen  - SCDs, SQ heparin   - Dispo: Pending clinical course, resolution of acute infection and gastric perforation. Will eventually require SNF     JBelinda Fisher MD  06/30/2017     9:03 AM  General Surgery Resident

## 2017-06-30 NOTE — Progress Notes (Signed)
All VSS, pt ambulated with walker to BR without difficulty, using PCA hydromorphone for pain control, resting comfortably in bed, tearful at times, daughter at bedside, will continue to monitor.

## 2017-07-01 LAB — CBC AND DIFFERENTIAL
Baso # K/uL: 0.1 10*3/uL (ref 0.0–0.1)
Basophil %: 1.4 %
Eos # K/uL: 0.5 10*3/uL — ABNORMAL HIGH (ref 0.0–0.4)
Eosinophil %: 5.8 %
Hematocrit: 26 % — ABNORMAL LOW (ref 34–45)
Hemoglobin: 7.9 g/dL — ABNORMAL LOW (ref 11.2–15.7)
IMM Granulocytes #: 0.1 10*3/uL
IMM Granulocytes: 0.7 %
Lymph # K/uL: 1.3 10*3/uL (ref 1.2–3.7)
Lymphocyte %: 14.9 %
MCH: 28 pg/cell (ref 26–32)
MCHC: 31 g/dL — ABNORMAL LOW (ref 32–36)
MCV: 90 fL (ref 79–95)
Mono # K/uL: 0.7 10*3/uL (ref 0.2–0.9)
Monocyte %: 8.1 %
Neut # K/uL: 5.9 10*3/uL (ref 1.6–6.1)
Nucl RBC # K/uL: 0 10*3/uL (ref 0.0–0.0)
Nucl RBC %: 0 /100 WBC (ref 0.0–0.2)
Platelets: 507 10*3/uL — ABNORMAL HIGH (ref 160–370)
RBC: 2.9 MIL/uL — ABNORMAL LOW (ref 3.9–5.2)
RDW: 16.8 % — ABNORMAL HIGH (ref 11.7–14.4)
Seg Neut %: 69.1 %
WBC: 8.6 10*3/uL (ref 4.0–10.0)

## 2017-07-01 LAB — POCT GLUCOSE
Glucose POCT: 118 mg/dL — ABNORMAL HIGH (ref 60–99)
Glucose POCT: 121 mg/dL — ABNORMAL HIGH (ref 60–99)
Glucose POCT: 125 mg/dL — ABNORMAL HIGH (ref 60–99)
Glucose POCT: 125 mg/dL — ABNORMAL HIGH (ref 60–99)

## 2017-07-01 LAB — BASIC METABOLIC PANEL
Anion Gap: 12 (ref 7–16)
CO2: 26 mmol/L (ref 20–28)
Calcium: 9.3 mg/dL (ref 8.6–10.2)
Chloride: 98 mmol/L (ref 96–108)
Creatinine: 0.49 mg/dL — ABNORMAL LOW (ref 0.51–0.95)
GFR,Black: 113 *
GFR,Caucasian: 98 *
Glucose: 120 mg/dL — ABNORMAL HIGH (ref 60–99)
Lab: 21 mg/dL — ABNORMAL HIGH (ref 6–20)
Potassium: 3.7 mmol/L (ref 3.3–5.1)
Sodium: 136 mmol/L (ref 133–145)

## 2017-07-01 LAB — HEPATIC FUNCTION PANEL
ALT: 59 U/L — ABNORMAL HIGH (ref 0–35)
AST: 70 U/L — ABNORMAL HIGH (ref 0–35)
Albumin: 2.8 g/dL — ABNORMAL LOW (ref 3.5–5.2)
Alk Phos: 532 U/L — ABNORMAL HIGH (ref 35–105)
Bilirubin,Direct: <0.2 mg/dL (ref 0.0–0.3)
Bilirubin,Total: <0.2 mg/dL (ref 0.0–1.2)
Total Protein: 6.6 g/dL (ref 6.3–7.7)

## 2017-07-01 LAB — PROTIME-INR
INR: 1.4 — ABNORMAL HIGH (ref 0.9–1.1)
Protime: 15.2 s — ABNORMAL HIGH (ref 10.0–12.9)

## 2017-07-01 LAB — MAGNESIUM: Magnesium: 1.6 mg/dL (ref 1.6–2.5)

## 2017-07-01 LAB — FUNGUS CULTURE: Fungal Culture: 0

## 2017-07-01 LAB — PHOSPHORUS: Phosphorus: 4.8 mg/dL — ABNORMAL HIGH (ref 2.7–4.5)

## 2017-07-01 LAB — PREALBUMIN: Prealbumin: 11 mg/dL — ABNORMAL LOW (ref 20–40)

## 2017-07-01 LAB — TRIGLYCERIDES: Triglycerides: 190 mg/dL — AB

## 2017-07-01 MED ORDER — STERILE WATER FOR INJECTION (TPN USE ONLY) *I*
INTRAVENOUS | Status: AC
Start: 2017-07-01 — End: 2017-07-02
  Filled 2017-07-01: qty 563.2

## 2017-07-01 MED ORDER — FAT EMULSION SOYBEAN OIL 20 % IV EMUL *WRAPPED*
54.0000 g | Freq: Every day | INTRAVENOUS | Status: AC
Start: 2017-07-01 — End: 2017-07-02
  Administered 2017-07-01: 54 g via INTRAVENOUS
  Filled 2017-07-01: qty 270

## 2017-07-01 MED ORDER — HYDROMORPHONE PCA 1 MG/ML *WRAPPED*
INTRAMUSCULAR | Status: AC
Start: 2017-07-01 — End: 2017-07-01
  Administered 2017-07-01: 0 via INTRAVENOUS
  Filled 2017-07-01: qty 25

## 2017-07-01 MED ORDER — MAGNESIUM SULFATE 2 GM IN 50 ML *WRAPPED*
2000.0000 mg | Freq: Once | INTRAVENOUS | Status: AC
Start: 2017-07-01 — End: 2017-07-01
  Administered 2017-07-01: 2000 mg via INTRAVENOUS
  Filled 2017-07-01: qty 50

## 2017-07-01 NOTE — Progress Notes (Signed)
Palliative Care Progress Note    HPI: [name] w/ PMH fibromyalgia, pancreatic CA s/p Whipple for 'cure' 03/29/17 with unfortunately complicated prolonged course including ICU & intub (extub 2/17), mult IR procedures for drainage of abscesses, VRE bacteremia w/ mediport removal 4/8, s/p R pleural pigtail; s/p PICC after neg bld cx; s/p IR PEG on 4/12 w/ increased pain & FU CT & UGI w/ revealing posterior gastric perforation, s/p IR perc drain x 2 & 4/19 CT revealed oral contrast passing into lesser sac/retroperitoneum. GOC discussion w/ Dr Ron Agee, pt's dtr/HCP and Palliative w/ pt agreeing to continue full treatment in hope of returning eventually to independent living. Pt continues on BSA & conservative bowel rest (no meds via PEG), started TPN 4/19 & now off methadone w/ pain controlled w/ Dilaudid PCA. Pt's most recent CT abd 4/26 showed resolution of gastric perforation, pt advancing her PO diet from clears. Palliative is following for ongoing symptom management and support.    Subjective: "I've been drinking, it hurts a little but my left side hurts the most. I did get up and walk today around my room, from bed to commode and to door and back then I've been in the chair. My feet are more swollen. [name] (dtr) started her new job today, I miss her"    Objective: chart reviewed, noted pt's PCA prn was increased per rec's on 4/26 (0.6 mg/hr + 0.6 mg prn) then the basal rate was decreased 50% to 0.3 mg/hr on 4/27 8 AM which pt did not tolerate & required increase to basal rate 0.5 mg/hr on 4/27 at 340 pm; pt's dilaudid PCA current use (0.5 mg/hr + 0.6 mg prn) has remained the same over last 24 hrs (32.3) vs previous 24 hrs (32.65 mg), pt reports abd pain 6/10 and ~ 10 min after hit PCA button reported pain down to 3/10; Michie remains emotional, feeling lonely and overwhelmed but encouraged by latest CT results and approval to drink liquids and trying soft foods like mashed potatoes; noted LEs more red and swollen  w/ blister noted on LLE    Palliative Care ROS: rated LUQ abd pain 3/10 after after hitting PCA dilaudid button   Pain   Mild  Nausea   None  Anorexia   Unknown  Anxiety   Moderate  Depression   Severe  Shortness of Breath   Mild  Tiredness/Fatigue   Mild  Drowsiness/Sleepiness   Mild  Airway Secretions   no  Constipation   no  Unable to Respond   no  Delirium   forgetful but oriented  Last stool 4/28    Physical Examination:   BP: (94-113)/(52-86)   Temp:  [35.7 C (96.3 F)-36.6 C (97.9 F)]   Temp src: Temporal (04/29 1410)  Heart Rate:  [85-98]   Resp:  [16-18]   SpO2:  [93 %-96 %]   Gen appearance: [name], sitting up in recliner, pt reports LUQ abd pain & feeling sad & anxious - offered emotional support   Lungs: easy resp, no cough, RA  Heart: reg, tele  Abd: large, distended and tender to touch, +BS, PEG to gravity drainage (bag empty on assessment), R side abd perc drain (scant pale green drainage) & L side abd perc drain (scant milky white drainage); voids on commode  Extrem: edematous LEs  Skin: warm, thin and fragile pink flaky dry skin on LEs, PIV in R hand, L arm PICC  Neuro: A+O x 3, ambulated w/ walker & sitting  up in recliner, intermittently tearful, responded to encouragement and emotional support    Assessment/Plan: [name] w/ pancreatic CA s/p Whipple in Jan w/ prolonged complicated hosp course, 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 Abd abscesses w/ drains in place, modified bowel rest w/ TPN and Dilaudid PCA for pain management as pt's methadone now weaned out of system. Pt's dilaudid use remained about same as previous day and pt has good response to PRN. No changes to pain med regimen today anticipating likely restarting methadone once pt doing well consistently taking PO.    Pain/dyspnea  S/p methadone (last IV dose was 4/21, now should be weaned out of system)  Dilaudid PCA 0.5 mg/hr + 0.5 mg IV Q 20  min prn (received total 32.3 mg from 4/28 6 am to 4/29 6 am) noted similar daily use previous day when used total 32.6 mg from 4/27 6 am to 4/28 6 am   Dilaudid 1 mg IV Q 2 hrs prn available for RN to give above PCA if warranted (received x 1 on 4/28)   Lidocaine patch daily x 2 (abd, LUE)  Acetaminophen 650 mg pr Q 4 hrs prn fever    Anxiety/agitation/nausea/fatigue  S/p duloxetine (last dose 4/15), stopped for bowel rest  Levothyroxine 69 mcg IV daily (started IV 4/23, last TSH checked 12/14/16 was 2.87)  Ativan 0.25 mg IV Q 6 hrs prn (x 2 on 4/28, x 1 on 4/29)  Ondansetron 4 mg IV Q 6 hrs prn (last x 1 on 4/23)  Promethazine 12.5 mg IV Q 6 hrs prn (not used, caution for sedation)  Encouraged complementary adjuvant therapies for coping, meditation app, aromatherapy, gentle massage     Prevention of opioid induced constipation, last stool 4/28  Bisacodyl supp pr daily     Dry flaky edematous Skin  Aquaphor prn   Eucerin prn for BLEs    GOC/prognosis  Full code  Dtr Rod Mae is HCP (very supportive and informed, former [facility] RN)  Goal currently is to prioritize pt's symptom control, long course though pt endorses agreement w/ continuing current treatment plan in hopes of eventually making recovery back to independent  living     Pt case discussed w/ bedside RN.     We will continue to follow along. Please call if questions/concerns.    Total Time Spent 40 minutes:   >50% of time was spent in counseling and/or coordination of care.     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    Hypothyroidism E03.9    Pancreatic cancer C25.9    Anemia D64.9     hx of Pulmonary embolism I26.99    Depression F32.9    Sepsis A41.9       No Known Allergies  (drug, envir, food or latex)    Scheduled Meds:    caspofungin  50 mg Intravenous Q24H    insulin lispro  0-20 Units Subcutaneous Q6H    levothyroxine IV  69 mcg Intravenous Daily    heparin (porcine)  5,000 Units Subcutaneous TID    daptomycin IV  10 mg/kg Intravenous Q24H    ertapenem  1,000 mg Intravenous Q24H  lidocaine  1 patch Transdermal Q24H    lidocaine  1 patch Transdermal Q24H    bisacodyl  10 mg Rectal Daily       Continuous Infusions:    TPN (2 in 1) Adult Continuous      And    fat emulsion soybean oil      TPN (2 in 1) Adult Continuous 55 mL/hr at 07/01/17 0537    sodium chloride Stopped (06/24/17 1925)    HYDROmorphone         PRN Meds:  acetaminophen, LORazepam, HYDROmorphone PF, Nursing communication- Give 4 OZ of fruit juice for BG < 70 mg/dl **AND** dextrose **AND** dextrose **AND** glucagon **AND** POCT glucose, naloxone, mineral oil-hydrophilic petrolatum, promethazine, sodium chloride, sodium chloride, sodium chloride, dextrose, ondansetron

## 2017-07-01 NOTE — Plan of Care (Signed)
Bowel Elimination     Elimination patterns are normal or improving Maintaining        Fluid and Electrolyte Imbalance     Fluid and Electrolyte imbalance Maintaining        GI Bleeding Elimination     Elimination of patterns are normal or improving Maintaining        Mobility     Functional status is maintained or improved - Geriatric Maintaining     Patient's functional status is maintained or improved Maintaining        Nutrition     Patient's nutritional status is maintained or improved Maintaining     Nutritional status is maintained or improved - Geriatric Maintaining        Pain/Comfort     Patient's pain or discomfort is manageable Maintaining        Post-Operative Bowel Elimination     Elimination pattern is normal or improving Maintaining        Post-Operative Hemodynamic Stability     Maintain Hemodynamic Stability Maintaining        Psychosocial     Demonstrates ability to cope with illness Maintaining        Safety     Patient will remain free of falls Maintaining

## 2017-07-01 NOTE — Progress Notes (Signed)
Surgical Oncology/Hepatobiliary Surgery Progress Note     LOS: 94 days     Subjective:  Interval Events:   NAEO. Remains afebrile. Pt reports that her pain is improved from previous.  Given Clear Liquid Diet over the weekend, continuing to monitor G-tube output and perc drain output. Both have been stable from previous.     Objective:  Vitals Sign Ranges for Past 24 Hours:  BP: (96-136)/(51-72)   Temp:  [35.8 C (96.4 F)-36.6 C (97.9 F)]   Temp src: Temporal (04/29 0532)  Heart Rate:  [88-103]   Resp:  [14-20]   SpO2:  [93 %-98 %]     Physical Exam:  General Appearance: Resting in bed, in NAD  Cardiac: regular rate  Respiratory: non-labored breathing on room air  Abdomen: Soft, non distended, G tube site c/d/i, tender surrounding tube. Perc site in hepatic abscess with bile tinged purulent material. Perc drain in abdominal collection with minimal milky output. PEG to gravity with clear liquids/grape juice in bag  Extremities: warm, well perfused    Labs:    CBC:    Recent Labs  Lab 07/01/17  0025 06/30/17  0100 06/29/17  0000 06/28/17  0020 06/27/17  0107 06/26/17  0013   WBC 8.6 8.1 8.6 10.8* 11.2* 11.1*   Hemoglobin 7.9* 7.9* 7.3* 8.0* 7.7* 8.0*   Hematocrit 26* 25* 23* 25* 25* 26*   Platelets 507* 470* 449* 470* 449* 376*       Metabolic Panel:    Recent Labs  Lab 07/01/17  0025 06/30/17  0100 06/29/17  0000 06/28/17  0020 06/27/17  0107 06/26/17  0013   Sodium 136 135 136 133 130* 128*   Potassium 3.7 3.7 4.0 4.0 4.0 4.0   Chloride 98 97 99 96 93* 91*   CO2 _0 UN 21* _1 Creatinine 0.49* 0.51 0.49* 0.51 0.51 0.51   Glucose 120* 123* 114* 114* 126* 113*   Calcium 9.3 9.0 8.8 8.9 8.7 8.5*   Magnesium 1.6 1.6 1.7 1.7 1.7 1.9   Phosphorus 4.8* 4.9* 4.9* 4.2 4.4 4.9*          Assessment:  72 y.o. female with h/o recent PE and pancreatic cancer POD # 29  status post whipple procedure complicated by delayed gastric emptying.  NGT replaced on 04/04/17. UGI on 04/12/17 concerning for  obstruction just beyond the [employer] in the efferent limb.  Course complicated on 2/83/15 by rising LFTs and sepsis with CT concerning for cholangitis given biliary tree obstruction and PV compression due to hematoma and afferent limb distention in setting of PE treatment. Is s/p IR PTC on 04/16/17 and IR percutaneous aspiration of anterior abdominal fluid collection on 04/24/17. IR LUQ perc drain placement 3/1. IR anterior abdominal 20f perc drain placement 3/3. IR anterior perc drain into left hepatic collection 3/6.    Plan:  - CLD, TPN for nutritional support.  - CT on 4/27 without evidence of oral contrast extravasation. Cont clears for comfort. G-tube to gravity. Will plan on G-tube clamp trial today.  - Appreciate palliative care recommendations. Multimodal pain management. Did not tolerate decrease in dPCA dose. Cont current rate for now  - Continue IV antibiotic regimen per ID Recommendations.  - Monitor perc drain outputs.   - Anemia; stable.   - Aggressive bowel regimen  - SCDs, SQ heparin   - Dispo: Pending clinical course. Will eventually require SNF  Bishop Dublin, MD  07/01/2017     6:27 AM   General Surgery Resident

## 2017-07-01 NOTE — Progress Notes (Signed)
Assumed care from 0700 - 1900. VSS. A&O x3 until the end of shift where she began to have some hallucinations, asking if "troopers had caught them" and asking to "go to the bodega." Pt is aware that she is hallucinating and is easily redirected. Pt OOB to chair for 3 hours. Marginal UOP. No output from perc drains. Tolerated G-tube clamps, on for 4 hours, off for 1. Very tearful and anxious throughout shift. Will continue to monitor.  Gerilyn Pilgrim, RN

## 2017-07-01 NOTE — Consults (Signed)
Medical Nutrition Therapy Brief Note: TPN check    Patient remains on TPN for nutrition support. Chart, labs, medications / TPN order, I/Os reviewed. Now on clear liquid, G-tube to gravity. Na 136 (WNL), K+ 3.7 (WNL), Cl 98 (WNL), CO2 26 (WNL), Ca 9.3 (WNL), PO4 4.8 (H), Mg 1.6 (WNL). Recent t bili <0.2 (WNL) from 4/29. Recent TGs 190 from 4/29.  BGs ranging from 120 - 135 over the past 24 hours.  MAR reviewed with repletions noted for Mg sulfate. I/Os significant for 510 ml PEG output, BM x1, -47.5 ml drain output. Current TPN order reviewed - no changes to TPN order needed at this time.    Recommendations:  1.  Continue current TPN as follows: Adult Continuous plus Fat (central infusion site)                Rate: 55 mL/hr                Volume (Amino Acid/Dextrose solution): 1320 mL/day                          Amino acids: 64 g/L                           Dextrose: 204 g/L                           Additives as follows:                                      Na: 106 mEq/L                                      K: 22 mEq/L                                      Ca: 4.5 mEq/L                                      Mg: 5 mEq/L                                      PO4: 9 mmol/L                                      Maximize Chloride                          Insulin: 0 units/LITER              Fat Emulsion 20% infusion: 54 gm/day (to be infused over 12 hours)  Provides 1790 kcal/day, 84 g protein/day, 269 g dextrose/day and 1590 mL total fluid/day (including amino acid/dextrose solution and lipid volume).  2.  ContinueTPN Panel.                Includes: PO4, Mg, BMP (basic metabolic panel) daily  for 3 days or until stable,    then 3 times per week.              CMP (comprehensive metabolic panel), Triglycerides at baseline and weekly.               BG every 6 hours until stable on goal dextrose.              Weight at baseline, then weekly.  3.  Continue clear liquid diet with advancement as tolerated. Resume enteral feeds  once medically appropriate      Will continue to monitor.     Laurence Aly, [place]  Pager [phone]

## 2017-07-01 NOTE — Progress Notes (Signed)
07/01/17 0800   [facility] Patient Class Review   Patient Class Review Inpatient     Patient Class Effective 03/29/2017    Mosetta Putt, RN  Pager: [phone]

## 2017-07-02 LAB — CBC AND DIFFERENTIAL
Baso # K/uL: 0.1 10*3/uL (ref 0.0–0.1)
Basophil %: 1.5 %
Eos # K/uL: 0.4 10*3/uL (ref 0.0–0.4)
Eosinophil %: 5.3 %
Hematocrit: 25 % — ABNORMAL LOW (ref 34–45)
Hemoglobin: 7.8 g/dL — ABNORMAL LOW (ref 11.2–15.7)
IMM Granulocytes #: 0.1 10*3/uL
IMM Granulocytes: 0.6 %
Lymph # K/uL: 1.3 10*3/uL (ref 1.2–3.7)
Lymphocyte %: 15.9 %
MCH: 28 pg/cell (ref 26–32)
MCHC: 31 g/dL — ABNORMAL LOW (ref 32–36)
MCV: 90 fL (ref 79–95)
Mono # K/uL: 0.7 10*3/uL (ref 0.2–0.9)
Monocyte %: 8.5 %
Neut # K/uL: 5.4 10*3/uL (ref 1.6–6.1)
Nucl RBC # K/uL: 0 10*3/uL (ref 0.0–0.0)
Nucl RBC %: 0 /100 WBC (ref 0.0–0.2)
Platelets: 515 10*3/uL — ABNORMAL HIGH (ref 160–370)
RBC: 2.8 MIL/uL — ABNORMAL LOW (ref 3.9–5.2)
RDW: 16.8 % — ABNORMAL HIGH (ref 11.7–14.4)
Seg Neut %: 68.2 %
WBC: 7.9 10*3/uL (ref 4.0–10.0)

## 2017-07-02 LAB — BASIC METABOLIC PANEL
Anion Gap: 15 (ref 7–16)
CO2: 24 mmol/L (ref 20–28)
Calcium: 8.7 mg/dL (ref 8.6–10.2)
Chloride: 102 mmol/L (ref 96–108)
Creatinine: 0.51 mg/dL (ref 0.51–0.95)
GFR,Black: 112 *
GFR,Caucasian: 97 *
Glucose: 121 mg/dL — ABNORMAL HIGH (ref 60–99)
Lab: 18 mg/dL (ref 6–20)
Potassium: 3.9 mmol/L (ref 3.3–5.1)
Sodium: 141 mmol/L (ref 133–145)

## 2017-07-02 LAB — POCT GLUCOSE
Glucose POCT: 126 mg/dL — ABNORMAL HIGH (ref 60–99)
Glucose POCT: 127 mg/dL — ABNORMAL HIGH (ref 60–99)
Glucose POCT: 137 mg/dL — ABNORMAL HIGH (ref 60–99)
Glucose POCT: 137 mg/dL — ABNORMAL HIGH (ref 60–99)

## 2017-07-02 LAB — MAGNESIUM: Magnesium: 1.8 mg/dL (ref 1.6–2.5)

## 2017-07-02 LAB — PHOSPHORUS: Phosphorus: 4.8 mg/dL — ABNORMAL HIGH (ref 2.7–4.5)

## 2017-07-02 MED ORDER — LORAZEPAM 2 MG/ML IJ SOLN *I*
0.2500 mg | Freq: Two times a day (BID) | INTRAMUSCULAR | Status: DC
Start: 2017-07-02 — End: 2017-07-04
  Administered 2017-07-02 – 2017-07-03 (×2): 0.25 mg via INTRAVENOUS
  Filled 2017-07-02 (×2): qty 1

## 2017-07-02 MED ORDER — INSULIN LISPRO (HUMAN) 100 UNIT/ML IJ/SC SOLN *WRAPPED*
0.0000 [IU] | Freq: Four times a day (QID) | SUBCUTANEOUS | Status: DC
Start: 2017-07-02 — End: 2017-07-08
  Administered 2017-07-04 – 2017-07-07 (×6): 1 [IU] via SUBCUTANEOUS

## 2017-07-02 MED ORDER — PHENAZOPYRIDINE HCL 100 MG PO TABS *I*
100.0000 mg | ORAL_TABLET | Freq: Three times a day (TID) | ORAL | Status: DC | PRN
Start: 2017-07-02 — End: 2017-07-09
  Filled 2017-07-02 (×7): qty 1

## 2017-07-02 MED ORDER — STERILE WATER FOR INJECTION (TPN USE ONLY) *I*
INTRAVENOUS | Status: AC
Start: 2017-07-02 — End: 2017-07-03
  Filled 2017-07-02: qty 563.2

## 2017-07-02 MED ORDER — SODIUM CHLORIDE 0.9 % IV BOLUS *I*
250.0000 mL | Freq: Once | Status: AC
Start: 2017-07-02 — End: 2017-07-03
  Administered 2017-07-03: 250 mL via INTRAVENOUS

## 2017-07-02 MED ORDER — FAT EMULSION SOYBEAN OIL 20 % IV EMUL *WRAPPED*
54.0000 g | Freq: Every day | INTRAVENOUS | Status: AC
Start: 2017-07-02 — End: 2017-07-03
  Administered 2017-07-02: 54 g via INTRAVENOUS
  Filled 2017-07-02: qty 270

## 2017-07-02 NOTE — Plan of Care (Addendum)
Problem: Safety  Goal: Patient will remain free of falls  Outcome: Maintaining

## 2017-07-02 NOTE — Progress Notes (Addendum)
Palliative Care Progress Note    HPI: [name] w/ PMH fibromyalgia, pancreatic CA s/p Whipple for 'cure' 03/29/17 with unfortunately complicated prolonged course including ICU & intub (extub 2/17), mult IR procedures for drainage of abscesses, VRE bacteremia w/ mediport removal 4/8, s/p R pleural pigtail; s/p PICC after neg bld cx; s/p IR PEG on 4/12 w/ increased pain & FU CT & UGI w/ revealing posterior gastric perforation, s/p IR perc drain x 2 & 4/19 CT revealed oral contrast passing into lesser sac/retroperitoneum.  Marland Kitchen   GOC discussion w/ Dr Ron Agee, pt's dtr/HCP with decision to remain Full code and keep working on healing despite setbacks.  Palliative is following for ongoing symptom management and support.    Subjective: " The pain (at GT site) is worse than ever"  Redness noted around site, patient willing to have wound care specialist take a look for pos. Topical med.      Palliative Care ROS:   rated LUQ abd pain 5/10 after after hitting PCA dilaudid button, 8/10 prior  Pain   Moderate to severe  Nausea   None  Anorexia  Moderate- now on dysphagia 1 diet & TPN  Anxiety   Moderate  Depression   Severe- tearing up and crying intermittent " states I want to give up but then changed mind with encouragement from daughter"  Shortness of Breath   Mild  Tiredness/Fatigue   Moderate  Drowsiness/Sleepiness   Mild  Airway Secretions   no  Constipation   no  Unable to Respond   no  Delirium   forgetful but oriented  Last stool 4/28    Physical Examination:   BP: (101-125)/(51-75)   Temp:  [35.9 C (96.6 F)-36.6 C (97.9 F)]   Temp src: Temporal (04/30 1720)  Heart Rate:  [89-106]   Resp:  [15-18]   SpO2:  [91 %-98 %]   Gen appearance: LUQ abd pain, pain at GT site and crying  Lungs: easy resp, no cough, RA  Heart: reg, tele  Abd: large, distended and tender to touch, +BS, PEG to gravity drainage (bag empty on assessment), R side abd perc drain (scant pale green drainage) & L side abd perc drain (scant milky white  drainage); voids on commode  Extrem: edematous LEs  Skin: warm, thin and fragile pink flaky dry skin on LEs, PIV in R hand, L arm PICC  Neuro: A+O x 3, ambulated w/ walker & sitting up in recliner, forgetful    Assessment/Plan: Amanda Galloway is a [name] w/ pancreatic CA s/p Whipple in Jan w/ prolonged complicated hosp course, 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 Abd abscesses w/ drains in place, modified bowel rest w/ TPN and Dilaudid PCA for pain management as pt's methadone now weaned out of system.     Pt's dilaudid use with 22 attempts and 16 delivered in 24 hrs on 07/01/17.  . Daughter asked if can have more frequent access to pain bolus dose will increase to every 15 min PRN.      Pain/dyspnea  S/p methadone (last IV dose was 4/21, now should be weaned out of system)  Dilaudid PCA 0.6 mg/hr + 0.5 mg IV Q 20 min prn - attempts have increased without delivered med and rating pain higher than previously.     Requested wound care consult to eval if GT site can be less painful with topical treatment  Dilaudid 1 mg IV Q 2 hrs prn available for RN to give above PCA if warranted     Lidocaine patch daily x 2 (abd, LUE)  Acetaminophen 650 mg pr Q 4 hrs prn fever    Anxiety/agitation/nausea/fatigue  Levothyroxine 69 mcg IV daily (started IV 4/23, last TSH checked 12/14/16 was 2.87)    Ativan 0.25 mg IV Q 6 hrs prn - discussed restarting BID ATC and continue PRN to help patient with anxiety without oversedating. Will have standing dose this evening.     Ondansetron 4 mg IV Q 6 hrs prn (last x 1 on 4/23)  Promethazine 12.5 mg IV Q 6 hrs prn (not used, caution for sedation)  Encouraged complementary adjuvant therapies for coping, meditation app, aromatherapy, gentle massage     Prevention of opioid induced constipation, last stool 4/28  Bisacodyl supp pr daily     Dry flaky edematous Skin  Aquaphor prn   Eucerin prn for  BLEs    GOC/prognosis  Full code  Dtr Rod Mae is HCP (very supportive and informed, former [facility] RN)  Goal currently is to prioritize pt's symptom control, long course though pt endorses agreement w/ continuing current treatment plan in hopes of eventually making recovery back to independent  living     Pt case discussed w/ bedside RN and NP on team     We will continue to follow for sx management.  Please call if questions/concerns.    Total Time Spent 35 minutes:   >50% of time was spent in counseling and/or coordination of care.   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    Hypothyroidism E03.9    Pancreatic cancer C25.9    Anemia D64.9     hx of Pulmonary embolism I26.99    Depression F32.9    Sepsis A41.9       No Known Allergies (drug, envir, food or latex)    Scheduled Meds:    insulin lispro  0-20 Units Subcutaneous Q6H    LORazepam  0.25 mg Intravenous Q12H    caspofungin  50 mg Intravenous Q24H    levothyroxine IV  69 mcg Intravenous Daily    heparin (porcine)  5,000 Units Subcutaneous TID    daptomycin IV  10 mg/kg Intravenous Q24H    ertapenem  1,000 mg Intravenous Q24H    lidocaine  1 patch Transdermal Q24H    lidocaine  1 patch Transdermal Q24H    bisacodyl  10 mg Rectal Daily       Continuous Infusions:    TPN (2 in 1) Adult Continuous 55 mL/hr at 07/02/17 1759    And    fat emulsion soybean oil 54 g (07/02/17 1800)    sodium chloride Stopped (06/24/17 1925)    HYDROmorphone         PRN Meds:  acetaminophen, LORazepam, HYDROmorphone PF, Nursing communication- Give 4 OZ of fruit juice for BG < 70 mg/dl **AND** dextrose **AND** dextrose **AND** glucagon **AND** POCT glucose, naloxone, mineral oil-hydrophilic  petrolatum, promethazine, sodium chloride, sodium chloride, sodium chloride, dextrose, ondansetron

## 2017-07-02 NOTE — Progress Notes (Signed)
Assumed care of pt at 1900. Q2 VSS. A&O x3 but very fatigued. No acute events overnight. Tele NSR throughout the shift. Tolerated G tube clamps, on for 4 hrs then off for 1hr. Denied N/V. Bilat perc drains flushed twice, Q8 as ordered. Continuous PCA for pain control. Anxious and tearful throughout the night. PRN ativan x2 given as requested by pt. Urine incontinence x2. Call bell and PCA button were within pt's reach at all times. Will continue to monitor.

## 2017-07-02 NOTE — Plan of Care (Addendum)
Problem: Pain/Comfort  Goal: Patient's pain or discomfort is manageable  Outcome: Progressing towards goal      Problem: Mobility  Goal: Functional status is maintained or improved - Geriatric  Outcome: Progressing towards goal    Goal: Patient's functional status is maintained or improved  Outcome: Progressing towards goal      Problem: Nutrition  Goal: Nutritional status is maintained or improved - Geriatric  Outcome: Progressing towards goal      Problem: Bowel Elimination  Goal: Elimination patterns are normal or improving  Outcome: Maintaining

## 2017-07-02 NOTE — Consults (Signed)
Medical Nutrition Therapy Brief Note: TPN check    72 y.o.femalewith h/o recent PE and pancreatic cancerstatus post whipple procedure complicated by delayed gastric emptying. NGT replaced on 04/04/17. UGI on 04/12/17 concerning for obstruction just beyond the [place] in the efferent limb. Course complicated on 08/31/34 by rising LFTs and sepsis with CT concerning for cholangitis given biliary tree obstruction and PV compression due to hematoma and afferent limb distention in setting of PE treatment. Is s/p IR PTC on 04/16/17 and IR percutaneous aspiration of anterior abdominal fluid collection on 04/24/17. IR LUQ perc drain placement 3/1. IR anterior abdominal 75f perc drain placement 3/3. IR anterior perc drain into left hepatic collection 3/6.    Patient remains on TPN for nutrition support. Chart, labs, medications / TPN order, I/Os reviewed. Na 141 (WNL), K+ 3.9 (WNL), Cl 102 (WNL), CO2 24 (WNL), Ca 8.7 (WNL), PO4 4.8 (H), Mg 1.8 (WNL). BGs ranging from 118 - 137 over the past 24 hours. MAR reviewed with repletions noted for Mg sulfate. I/Os significant for 500 ml PEG output. Current TPN order reviewed - please see recommendations below for changes.    Recommendations:  1.  Continue TPN as follows:  Adult Continuous plus Fat (central infusion site)   Rate:516mhr   Volume (Amino Acid/Dextrose solution): 132062may  Amino acids:64g/L   Dextrose: 204g/L   Additives as follows:  Na: 106m74m  K: 22mE41m Ca: 4.5mEq/24mMg: 5mEq/L86mO4: 7mmol/L26maximize Chloride  Insulin: 0units/LITER  Fat Emulsion 20% infusion: 54gm/day (to be infused over 12 hours)  Provides  1790kcal/day, 84g protein/day, 269g dextrose/day and 1590mL tot61mluid/day (including amino acid/dextrose solution and lipid volume).  2. ContinueTPN Panel.   Includes: PO4, Mg, BMP (basic metabolic panel) daily for 3 days or until stable, then 3 times per week.  CMP (comprehensive metabolic panel), Triglycerides at baseline and weekly.   BG every 6 hours until stable on goal dextrose.  Weight at baseline, then weekly.  3. Continue dysphagia 1 diet as tolerated. Resume enteral feeds once medically appropriate      Will continue to monitor.     Madoc Holquin RoyLaurence AlyerEast [phone]

## 2017-07-02 NOTE — Progress Notes (Signed)
Surgical Oncology/Hepatobiliary Surgery Progress Note     LOS: 95 days     Subjective:  Interval Events:   NAEO. Remains afebrile. Pt complaining of abdominal pain at the bedside this AM. Is pretty consistent from previous examinations. Was given Soft Diet yesterday, patient has been tolerating well. G-tube output 500cc yesterday, stable.    Objective:  Vitals Sign Ranges for Past 24 Hours:  BP: (94-125)/(52-86)   Temp:  [35.7 C (96.3 F)-36.6 C (97.9 F)]   Temp src: Temporal (04/30 1000)  Heart Rate:  [85-106]   Resp:  [15-18]   SpO2:  [91 %-97 %]     Physical Exam:  General Appearance: Resting in bed, in NAD  Cardiac: regular rate  Respiratory: non-labored breathing on room air  Abdomen: Soft, non distended, G tube site c/d/i, tender surrounding tube. Perc site in hepatic abscess with bile tinged purulent material. Perc drain in abdominal collection with minimal milky output. PEG to gravity with clear liquids/grape juice in bag  Extremities: warm, well perfused    Labs:    CBC:    Recent Labs  Lab 07/02/17  0200 07/01/17  0025 06/30/17  0100 06/29/17  0000 06/28/17  0020 06/27/17  0107   WBC 7.9 8.6 8.1 8.6 10.8* 11.2*   Hemoglobin 7.8* 7.9* 7.9* 7.3* 8.0* 7.7*   Hematocrit 25* 26* 25* 23* 25* 25*   Platelets 515* 507* 470* 449* 470* 573*       Metabolic Panel:    Recent Labs  Lab 07/02/17  0200 07/01/17  0025 06/30/17  0100 06/29/17  0000 06/28/17  0020 06/27/17  0107   Sodium 141 136 135 136 133 130*   Potassium 3.9 3.7 3.7 4.0 4.0 4.0   Chloride 102 98 97 99 96 93*   CO2 '24 26 25 24 24 26   ' UN 18 21* '16 17 18 17   ' Creatinine 0.51 0.49* 0.51 0.49* 0.51 0.51   Glucose 121* 120* 123* 114* 114* 126*   Calcium 8.7 9.3 9.0 8.8 8.9 8.7   Magnesium 1.8 1.6 1.6 1.7 1.7 1.7   Phosphorus 4.8* 4.8* 4.9* 4.9* 4.2 4.4          Assessment:  72 y.o. female with h/o recent PE and pancreatic cancer POD # 95  status post whipple procedure complicated by delayed gastric emptying.  NGT replaced on 04/04/17. UGI on 04/12/17  concerning for obstruction just beyond the [place] in the efferent limb.  Course complicated on 04/25/23 by rising LFTs and sepsis with CT concerning for cholangitis given biliary tree obstruction and PV compression due to hematoma and afferent limb distention in setting of PE treatment. Is s/p IR PTC on 04/16/17 and IR percutaneous aspiration of anterior abdominal fluid collection on 04/24/17. IR LUQ perc drain placement 3/1. IR anterior abdominal 17f perc drain placement 3/3. IR anterior perc drain into left hepatic collection 3/6.    Plan:  - Dysphagia I Diet, TPN for nutritional support.  - CT on 4/27 without evidence of oral contrast extravasation. G-tube to gravity. Continue G-tube clamp trials.  - Appreciate palliative care recommendations. Multimodal pain management. Did not tolerate decrease in dPCA dose. Cont current rate for now  - Continue IV antibiotic regimen per ID Recommendations.  - Monitor perc drain outputs.   - Anemia; stable.   - Aggressive bowel regimen  - SCDs, SQ heparin   - Dispo: Pending clinical course. Will eventually require SNF     KBishop Dublin MD  07/02/2017  11:41 AM   General Surgery Resident

## 2017-07-02 NOTE — Plan of Care (Signed)
Bowel Elimination     Elimination patterns are normal or improving Progressing towards goal        Fluid and Electrolyte Imbalance     Fluid and Electrolyte imbalance Progressing towards goal        GI Bleeding Elimination     Elimination of patterns are normal or improving Progressing towards goal        Mobility     Functional status is maintained or improved - Geriatric Progressing towards goal     Patient's functional status is maintained or improved Progressing towards goal        Nutrition     Patient's nutritional status is maintained or improved Progressing towards goal     Nutritional status is maintained or improved - Geriatric Progressing towards goal        Pain/Comfort     Patient's pain or discomfort is manageable Progressing towards goal        Post-Operative Bowel Elimination     Elimination pattern is normal or improving Progressing towards goal        Post-Operative Hemodynamic Stability     Maintain Hemodynamic Stability Progressing towards goal        Psychosocial     Demonstrates ability to cope with illness Progressing towards goal        Safety     Patient will remain free of falls Progressing towards goal

## 2017-07-03 ENCOUNTER — Inpatient Hospital Stay: Payer: Medicare (Managed Care)

## 2017-07-03 LAB — CBC AND DIFFERENTIAL
Baso # K/uL: 0.1 10*3/uL (ref 0.0–0.1)
Basophil %: 1.4 %
Eos # K/uL: 0.5 10*3/uL — ABNORMAL HIGH (ref 0.0–0.4)
Eosinophil %: 6.9 %
Hematocrit: 25 % — ABNORMAL LOW (ref 34–45)
Hemoglobin: 7.6 g/dL — ABNORMAL LOW (ref 11.2–15.7)
IMM Granulocytes #: 0 10*3/uL
IMM Granulocytes: 0.6 %
Lymph # K/uL: 1.2 10*3/uL (ref 1.2–3.7)
Lymphocyte %: 17.3 %
MCH: 28 pg/cell (ref 26–32)
MCHC: 30 g/dL — ABNORMAL LOW (ref 32–36)
MCV: 91 fL (ref 79–95)
Mono # K/uL: 0.7 10*3/uL (ref 0.2–0.9)
Monocyte %: 9.7 %
Neut # K/uL: 4.6 10*3/uL (ref 1.6–6.1)
Nucl RBC # K/uL: 0 10*3/uL (ref 0.0–0.0)
Nucl RBC %: 0 /100 WBC (ref 0.0–0.2)
Platelets: 523 10*3/uL — ABNORMAL HIGH (ref 160–370)
RBC: 2.8 MIL/uL — ABNORMAL LOW (ref 3.9–5.2)
RDW: 17.2 % — ABNORMAL HIGH (ref 11.7–14.4)
Seg Neut %: 64.1 %
WBC: 7.1 10*3/uL (ref 4.0–10.0)

## 2017-07-03 LAB — URINE MICROSCOPIC (IQ200)

## 2017-07-03 LAB — BASIC METABOLIC PANEL
Anion Gap: 14 (ref 7–16)
CO2: 25 mmol/L (ref 20–28)
Calcium: 9.1 mg/dL (ref 8.6–10.2)
Chloride: 102 mmol/L (ref 96–108)
Creatinine: 0.49 mg/dL — ABNORMAL LOW (ref 0.51–0.95)
GFR,Black: 113 *
GFR,Caucasian: 98 *
Glucose: 120 mg/dL — ABNORMAL HIGH (ref 60–99)
Lab: 22 mg/dL — ABNORMAL HIGH (ref 6–20)
Potassium: 3.6 mmol/L (ref 3.3–5.1)
Sodium: 141 mmol/L (ref 133–145)

## 2017-07-03 LAB — URINALYSIS REFLEX TO CULTURE
Blood,UA: NEGATIVE
Ketones, UA: NEGATIVE
Nitrite,UA: NEGATIVE
Protein,UA: NEGATIVE mg/dL
Specific Gravity,UA: 1.019 (ref 1.002–1.030)
pH,UA: 6 (ref 5.0–8.0)

## 2017-07-03 LAB — CK: CK: 48 U/L (ref 26–192)

## 2017-07-03 LAB — MAGNESIUM: Magnesium: 1.7 mg/dL (ref 1.6–2.5)

## 2017-07-03 LAB — POCT GLUCOSE
Glucose POCT: 120 mg/dL — ABNORMAL HIGH (ref 60–99)
Glucose POCT: 121 mg/dL — ABNORMAL HIGH (ref 60–99)
Glucose POCT: 140 mg/dL — ABNORMAL HIGH (ref 60–99)
Glucose POCT: 98 mg/dL (ref 60–99)

## 2017-07-03 LAB — PHOSPHORUS: Phosphorus: 4.7 mg/dL — ABNORMAL HIGH (ref 2.7–4.5)

## 2017-07-03 MED ORDER — FAT EMULSION SOYBEAN OIL 20 % IV EMUL *WRAPPED*
54.0000 g | Freq: Every day | INTRAVENOUS | Status: AC
Start: 2017-07-03 — End: 2017-07-04
  Administered 2017-07-03: 54 g via INTRAVENOUS
  Filled 2017-07-03: qty 270

## 2017-07-03 MED ORDER — STERILE WATER FOR IRRIGATION IR SOLN *I*
900.0000 mL | Freq: Once | Status: AC
Start: 2017-07-03 — End: 2017-07-03
  Administered 2017-07-03: 500 mL via ORAL

## 2017-07-03 MED ORDER — IOHEXOL 350 MG/ML (OMNIPAQUE) IV SOLN *I*
1.0000 mL | Freq: Once | INTRAVENOUS | Status: AC
Start: 2017-07-03 — End: 2017-07-03
  Administered 2017-07-03: 119 mL via INTRAVENOUS

## 2017-07-03 MED ORDER — STERILE WATER FOR INJECTION (TPN USE ONLY) *I*
INTRAVENOUS | Status: AC
Start: 2017-07-03 — End: 2017-07-04
  Filled 2017-07-03: qty 563.2

## 2017-07-03 NOTE — Consults (Signed)
Medical Nutrition Therapy Brief Note: TPN check    Patient remains on TPN for nutrition support. Chart, labs, medications / TPN order, I/Os reviewed. Na 141 (WNL), K+ 3.6 (WNL), Cl 102 (WNL), CO2 25 (WNL), Ca 9.1 (WNL), PO4 4.7 (H), Mg 1.7 (WNL). BGs ranging from 120 - 140 over the past 24 hours. MAR reviewed. I/Os significant for 210 ml PEG output, -72 ml drain output, BM x1. Current TPN order reviewed - please see recommendations below for changes.    Recommendations:  1.  Continue TPN as follows: Adult Continuous plus Fat (central infusion site)   Rate:32mL/hr   Volume (Amino Acid/Dextrose solution): 1383mL/day  Amino acids:64g/L   Dextrose: 204g/L  Additives as follows:  Na: 150mEq/L  K: 63mEq/L  Ca: 4.25mEq/L  Mg: 4mEq/L  PO4: 30mmol/L  Maximize Chloride  Insulin: 0units/LITER  Fat Emulsion 20% infusion: 54gm/day (to be infused over 12 hours)  Provides 1790kcal/day, 84g protein/day, 269g dextrose/day and 1526mL total fluid/day (including amino acid/dextrose solution and lipid volume).  2. ContinueTPN Panel.   Includes: PO4, Mg, BMP (basic metabolic panel) daily for 3 days or until stable, then 3 times per week.  CMP (comprehensive metabolic panel), Triglycerides at baseline and weekly.   BG every 6 hours until stable on goal dextrose.  Weight at baseline, then weekly.  3. Continue dysphagia 1 diet as tolerated. Resume enteral feeds once medically appropriate     Will continue to monitor.     Laurence Aly, [place]  Pager [phone]

## 2017-07-03 NOTE — Consults (Signed)
Wound Consult Note, Initial    Patient Unit: [facility]    Amanda Galloway  72 y.o. year old female was referred by nursing for evaluation and treatment recommendations regarding: peg tube site.   HPI, PMH/PSH, Labs, MAR reviewed.      Patient c/o:    [x]  Pain  []  Burning   []  Itching  []  Odor  []  No complaints at this time    Exam    Patient alert, awake, NAD.  Last Braden risk assessment score 16    Wound #1:   Location: [place] site   Stage: n/a  Suspected underlying etiology/wound type: friction from loose bumper  Wound bed:  Slough around the meatus of tract  Drainage:  minimal  Edges:  erythematous  Periwound skin:  erythematous   Odor after cleansing: None    Assessment  Irritation around opening of peg tube from loose fitting bumper. Bumper unable to be tightened due to presence of peg tube buttons     Plan  Apply criticaid clear around opening of peg tube  Consider removing peg tube buttons if possible, to tighten bumper  Turn and position every two hours and PRN  LAL mattress    Thank you very much for consult.  Wound Nurse will follow-up as needed.   Please call with any questions or concerns.    Nanine Means, CWON

## 2017-07-03 NOTE — Progress Notes (Signed)
Assumed care of patient at 0700. Patient transferred from bed to chair with two assist. Small PO intake. 11:50 Patient currently off the unit to CT. Transported in bed with staff member present as patient is on telemetry. Voiding adequate amounts of clear, yellow urine. Next shift aware and will continue to monitor and treat per orders. Raeanne Gathers, RN

## 2017-07-03 NOTE — Progress Notes (Signed)
Assumed care at 1500, VSS. NSR on tele. Pt sleepy upon arrival to room. Pt received prn ativan at 1449. Pt c/o of some burning with urination, bladder scanned for 0cc after pt voided. Mee Hives, NP notified, no new orders at this time. Pt ambulated 48ft in hall 2 person assist at [place] fairly well w/ 2 seated rest breaks. Pt tearful during walk. Scheduled ativan than given at 1752. Pt now resting comfortably. Pt tol some of dinner (yogurt & mashed potatoes). Will continue to monitor.Salome Holmes, RN

## 2017-07-03 NOTE — Plan of Care (Signed)
Problem: Safety  Goal: Patient will remain free of falls  Outcome: Maintaining      Problem: Pain/Comfort  Goal: Patient's pain or discomfort is manageable  Outcome: Maintaining    Goal: Patient's pain or discomfort is manageable  Outcome: Maintaining      Problem: Mobility  Goal: Functional status is maintained or improved - Geriatric  Outcome: Maintaining    Goal: Patient's functional status is maintained or improved  Outcome: Maintaining      Problem: Nutrition  Goal: Patient's nutritional status is maintained or improved  Outcome: Maintaining    Goal: Nutritional status is maintained or improved - Geriatric  Outcome: Maintaining      Problem: Bowel Elimination  Goal: Elimination patterns are normal or improving  Outcome: Maintaining      Problem: Post-Operative Hemodynamic Stability  Goal: Maintain Hemodynamic Stability  Outcome: Maintaining      Problem: Post-Operative Bowel Elimination  Goal: Elimination pattern is normal or improving  Outcome: Maintaining      Problem: Psychosocial  Goal: Demonstrates ability to cope with illness  Outcome: Maintaining      Problem: Fluid and Electrolyte Imbalance  Goal: Fluid and Electrolyte imbalance  Outcome: Maintaining      Problem: GI Bleeding Elimination  Goal: Elimination of patterns are normal or improving  Outcome: Maintaining

## 2017-07-03 NOTE — Progress Notes (Signed)
Report Given   Handoff received from [place] on unit [facility]. Aware of patient coming to [facility] for [place].         Procedure Summary

## 2017-07-03 NOTE — Progress Notes (Signed)
Palliative Care Progress Note    HPI: 73 yo F w/ PMH fibromyalgia, pancreatic CA s/p Whipple for 'cure' 03/29/17 with unfortunately complicated prolonged course including ICU & intub (extub 2/17), mult IR procedures for drainage of abscesses, VRE bacteremia w/ mediport removal 4/8, s/p R pleural pigtail; s/p PICC after neg bld cx; s/p IR PEG on 4/12 w/ increased pain & FU CT & UGI w/ revealing posterior gastric perforation, s/p IR perc drain x 2 & 4/19 CT revealed oral contrast passing into lesser sac/retroperitoneum. GOC discussion w/ Dr Ron Agee, pt's dtr/HCP and Palliative w/ pt agreeing to continue full treatment in hope of returning eventually to independent living. Pt continues on BSA & conservative bowel rest (no meds via PEG), started TPN 4/19 & now off methadone w/ pain controlled w/ Dilaudid PCA. Pt's most recent CT abd 4/26 showed resolution of gastric perforation, pt advancing her PO diet from clears. Palliative is following for ongoing symptom management and support.    Subjective: "I'm still having pain here (LUQ by PEG site)"    Objective: chart reviewed, pt's PCA prn frequency was increased yesterday w/ good effect and she has used less overall on her PCA in last 24 hrs; Amanda Galloway has been tolerating advancing on her diet, ate yogurt and drinking juice w/ occasional nausea but no emesis; she c/o overall pain esp shoulders r/t fibromyalgia and LUQ abd pain near PEG site - dtr asked about getting PEG out or at least taking retention sutures out; repeat CT today evaluating fluid collections and no evidence of gastric perforation    Palliative Care ROS: rated LUQ abd pain 4/10 after after hitting PCA dilaudid button   Pain   Mild  Nausea   None  Anorexia   Unknown  Anxiety   Moderate  Depression   Severe  Shortness of Breath   Mild  Tiredness/Fatigue   Mild  Drowsiness/Sleepiness   Mild  Airway Secretions   no  Constipation   no  Unable to Respond   no  Delirium   forgetful but oriented  Last stool  4/30    Physical Examination:   BP: (96-115)/(52-73)   Temp:  [36 C (96.8 F)-36.5 C (97.7 F)]   Temp src: Temporal (05/01 1808)  Heart Rate:  [87-98]   Resp:  [16-18]   SpO2:  [92 %-98 %]   Gen appearance: [name], sitting up in bed, pt reports LUQ abd pain & pain all over, still anxious despite scheduled ativan, received addit prn and offered emotional support   Lungs: easy resp, no cough, RA  Heart: reg, tele  Abd: large, distended and tender to touch, +BS, PEG clamped, R side abd perc drain (scant pale green drainage) & L side abd perc drain (scant milky white drainage); voids on commode  Extrem: edematous LEs  Skin: warm, thin and fragile pink flaky dry skin on LEs, L arm PICC  Neuro: A+O x 3, baseline ambulates w/ walker & sits up in recliner; feeling tired today & recently took ativan prn for anxiety; offered encouragement and emotional support    Assessment/Plan: [name] w/ pancreatic CA s/p Whipple in Jan w/ prolonged complicated hosp course, 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 Abd abscesses w/ drains in place, modified bowel rest w/ TPN and Dilaudid PCA for pain management as pt's methadone now weaned out of system. Pt started restarted on scheduled ativan yesterday. Pt's dilaudid use  down 17% from previous day and pt's pain is manageable w/ increased freq PRN. No changes to pain med regimen today but discussed restarting methadone & antidepressant in AM as pt doing well consistently taking PO.    Pain/dyspnea  S/p methadone (last IV dose was 4/21, now should be weaned out of system)  Dilaudid PCA 0.5 mg/hr + 0.6 mg IV Q 15 min prn (received total 26.05 mg from 4/30 6 am to 5/1 6 am, equiv to 520 mg po ME w/ 15:1 ratio to methadone equiv to ~ 35 mg/d) noted similar decreased 17% use from previous day when used total 31.41 mg from 4/29 6 am to 4/30 6 am   Restart methadone 5 mg po TID in AM, suggest decrease  basal rate 50% to 0.2 mg/hr x 8 hrs then turn off and keep PRN only (pt advised we anticipate she will need to use her PRN more frequently as dilaudid levels wean down over 8-12 hrs when cont rate is stopped and methadone takes effect & reaches its peak effect in 3-7 days  Dilaudid 1 mg IV Q 2 hrs prn available for RN to give above PCA if warranted (received x 1 on 4/30)   Lidocaine patch daily x 2 (abd, LUE)  Acetaminophen 650 mg pr Q 4 hrs prn fever    Anxiety/agitation/nausea/fatigue  S/p duloxetine (last dose 4/15), stopped for bowel rest -restart in AM, Cymbalta 20 mg po daily  Dtr interested in pt trying pregablin again for her fibromyalgia - in chart review she took 150 mg/d from 1/25-31, it was stopped 1/31 when NGT placed & had tried in Aug 2018 stopped d/t no effect; please chk w/ phmd first & if no signif drug interactions then restart   Levothyroxine 69 mcg IV daily (started IV 4/23, last TSH checked 12/14/16 was 2.87)  Ativan 0.25 mg IV Q 12 hrs ATC (started 4/30)  Ativan 0.25 mg IV Q 6 hrs prn (x 1 on 4/30, x 3 on 5/1)  Ondansetron 4 mg IV Q 6 hrs prn (x 1 on 5/1)  Promethazine 12.5 mg IV Q 6 hrs prn (not used, caution for sedation)  Encouraged complementary adjuvant therapies for coping, meditation app, aromatherapy, gentle massage     Prevention of opioid induced constipation, last stool 4/30  Bisacodyl supp pr daily     Dry flaky edematous Skin  Aquaphor prn   Eucerin prn for BLEs    GOC/prognosis  Full code  Dtr Rod Mae is HCP (very supportive and informed, former [facility] RN)  Goal currently is to prioritize pt's symptom control, long course though pt endorses agreement w/ continuing current treatment plan in hopes of eventually making recovery back to independent  living     Pt case discussed w/ bedside RN & extended time w/ dtr [name] offering support. Deferred to Dr Carolin Coy team to go over recent CT in detail though did relay preliminary results that no gastric perforation noted.     We  will continue to follow along. I'll plan to see pt again on Friday. Please call my PC colleagues if questions/concerns.    Total Time Spent 45 minutes:   >50% of time was spent in counseling and/or coordination of care.     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    Malignant neoplasm of head of pancreas C25.0  Pancreatic adenocarcinoma s/p Whipple 03/29/17 C25.9    Hypothyroidism E03.9    Pancreatic cancer C25.9    Anemia D64.9     hx of Pulmonary embolism I26.99    Depression F32.9    Sepsis A41.9       No Known Allergies (drug, envir, food or latex)    Scheduled Meds:    insulin lispro  0-20 Units Subcutaneous Q6H    LORazepam  0.25 mg Intravenous Q12H    caspofungin  50 mg Intravenous Q24H    levothyroxine IV  69 mcg Intravenous Daily    heparin (porcine)  5,000 Units Subcutaneous TID    daptomycin IV  10 mg/kg Intravenous Q24H    ertapenem  1,000 mg Intravenous Q24H    lidocaine  1 patch Transdermal Q24H    lidocaine  1 patch Transdermal Q24H    bisacodyl  10 mg Rectal Daily       Continuous Infusions:    TPN (2 in 1) Adult Continuous 55 mL/hr at 07/03/17 1815    And    fat emulsion soybean oil 22.5 mL/hr at 07/03/17 1815    sodium chloride 3 mL/hr (07/03/17 0231)    HYDROmorphone         PRN Meds:  phenazopyridine, acetaminophen, LORazepam, HYDROmorphone PF, Nursing communication- Give 4 OZ of fruit juice for BG < 70 mg/dl **AND** dextrose **AND** dextrose **AND** glucagon **AND** POCT glucose, naloxone, mineral oil-hydrophilic petrolatum, promethazine, sodium chloride, sodium chloride, sodium chloride, dextrose, ondansetron

## 2017-07-03 NOTE — Progress Notes (Addendum)
Surgical Oncology/Hepatobiliary Surgery Progress Note     LOS: 96 days     Subjective:  Interval Events:   No acute events overnight  Tearful today, states that she is getting overwhelmed and anxious    Objective:  Vitals Sign Ranges for Past 24 Hours:  BP: (96-115)/(51-73)   Temp:  [35.9 °C (96.6 °F)-36.6 °C (97.9 °F)]   Temp src: Temporal (05/01 0739)  Heart Rate:  [87-98]   Resp:  [16-18]   SpO2:  [95 %-98 %]     Physical Exam:  General Appearance: Resting in bed, in NAD  Cardiac: regular rate  Respiratory: non-labored breathing on room air  Abdomen: Soft, non distended, G tube site c/d/i, tender surrounding tube. Perc site in hepatic abscess with bile tinged purulent material. Perc drain in abdominal collection with minimal milky output. PEG to gravity  Extremities: warm, well perfused    Labs:    CBC:    Recent Labs  Lab 07/03/17  0032 07/02/17  0200 07/01/17  0025 06/30/17  0100 06/29/17  0000 06/28/17  0020   WBC 7.1 7.9 8.6 8.1 8.6 10.8*   Hemoglobin 7.6* 7.8* 7.9* 7.9* 7.3* 8.0*   Hematocrit 25* 25* 26* 25* 23* 25*   Platelets 523* 515* 507* 470* 449* 470*       Metabolic Panel:    Recent Labs  Lab 07/03/17  0032 07/02/17  0200 07/01/17  0025 06/30/17  0100 06/29/17  0000 06/28/17  0020   Sodium 141 141 136 135 136 133   Potassium 3.6 3.9 3.7 3.7 4.0 4.0   Chloride 102 102 98 97 99 96   CO2 25 24 26 25 24 24   UN 22* 18 21* 16 17 18   Creatinine 0.49* 0.51 0.49* 0.51 0.49* 0.51   Glucose 120* 121* 120* 123* 114* 114*   Calcium 9.1 8.7 9.3 9.0 8.8 8.9   Magnesium 1.7 1.8 1.6 1.6 1.7 1.7   Phosphorus 4.7* 4.8* 4.8* 4.9* 4.9* 4.2          Assessment:  71 y.o. female with h/o recent PE and pancreatic cancer POD # 96  status post whipple procedure complicated by delayed gastric emptying.  NGT replaced on 04/04/17. UGI on 04/12/17 concerning for obstruction just beyond the [place] in the efferent limb.  Course complicated on 04/16/17 by rising LFTs and sepsis with CT concerning for cholangitis given biliary tree  obstruction and PV compression due to hematoma and afferent limb distention in setting of PE treatment. Is s/p IR PTC on 04/16/17 and IR percutaneous aspiration of anterior abdominal fluid collection on 04/24/17. IR LUQ perc drain placement 3/1. IR anterior abdominal 8fr perc drain placement 3/3. IR anterior perc drain into left hepatic collection 3/6.    Plan:  - Dysphagia I Diet, TPN for nutritional support.  - CT on 4/27 without evidence of oral contrast extravasation. Plan to repeat CT today.   - Continue G-tube clamp trials  - Appreciate palliative care recommendations. Multimodal pain management  - Continue IV antibiotic regimen per ID Recommendations.  - Monitor perc drain outputs.   - Anemia; stable.   - Aggressive bowel regimen  - SCDs, SQ heparin   - Dispo: Pending clinical course. Will eventually require SNF     Amanda Boodry, MD  07/03/2017     10:48 AM   General Surgery Resident    HPB-GI Attending Addendum:   I personally examined the patient, reviewed the notes, and discussed the plan of care   with the residents and patient. Agree with detailed resident's note. Please see the note above for details of history, exam, labs, assessment/plan which reflect my input.       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   Stable fluid collection. Leakage resolved. Minimal output from drain.   Attempting dietary advance. Gastrostomy tube clamping.   Recent bacteremia with VRE.  Continue current antibiotics.   Hyperglycemia with insulin sliding scale and recurrent need for insulin. continue   Post operative anemia. Stable.  Stable. No need for transfusion.  Pulmonary embolism. Present on admission. Heparin drip once stable for treatment. Holding for procedures.   Hypoalbuminemia. Sever protein calorie malnutrition.   TPN to continue as we attempt diet advancement.  Depression. Ritalin helpful. Daily encouragement and reinforcement of goals.   Constipation . Aggressive regimen.   Physical debility. Requires rehabilitation setting.   Pain control. Increase PCA.         Amanda Metz, MD, FACS  Hepatobiliary, Pancreas & GI Surgery

## 2017-07-03 NOTE — Progress Notes (Signed)
Infectious Diseases (Team 3) Follow Up Note    Personally reviewed chart, labs, medications. Patient seen.     CC:  F/U VRE bacteremia, Liver and abdominal abscesses, s/p Enterobacter cloacae bacteremia, Intra-abdominal abscesses positive for VRE, enterobacter and candida glabrata    HPI:   72yo old female with pancreatic cancer, s/p Whipple procedure on 03/29/17 after neoadjuvant chemotherapy and radiation. Her post op course was c/b delayed gastric emptying, gastric distension and obstruction and a large surgical bed hematoma causing CBD and hepatic vein compression, severe biliary sepsis, s/p placement of a right hepatic duct external biliary drain placed on 2/12, found to have enterobacter cloacae bacteremia (2/12) presumed to be secondary to infection of the hematoma. However CT of abdomen (2/18) showed the hematoma had resolved but noted to have a well-circumscribed and larger anterior abdominal collection percutaneously, which was aspirated (2/19) and cultures grew enterobacter cloacae and Candida glabrata. ID service consulted on 2/21 with question of treating C.glabrata.  Caspofungin was started and a perc drain was placed LUQ 3/1, then anterior abdominal perc drain placed 3/3, and then anterior perc drain into left hepatic collection 05/08/17, the goal being source control with drain placements. She had been on Zosyn but her enterobacter became (amp-C inducible) thus switched to ertapenem and Daptomycin added on 05/04/17 for continued broad coverage.  Caspofungin was switched to oral high-dose fluconazole (74m/kg) daily once sensitivities returned on C.glabrata. Pt on methadone for pain control, QTc 409. Abdominal perc drains where removed once drainage was minimal as well as biliary tube was removed. However, there remained several communicating multiloculated fluid collections in LUQ and some areas of reaccumulation on 05/23/17 CT of a/p.   Summary of abdominal drain removals: Mid Abd on 3/17,  LUQ on 3/19,  Biliary Tube on 3/26, right lateral abd on 05/29/17.      She was switched over to oral therapy on 05/30/17 in hopes to ready her for SNF Rehab, ie, changed to Bactrim for enterobacter and to remain on high dose fluconazole for C. Glabrata, (no good option for VRE given DDI with Linezolid), with plan to reimage to fluid collections. She was initially stable on oral antibiotic regimen and ID signed off on 06/03/17.        ID Fellow team was re-consulted on 4/4 for with concern for early sepsis given patient now with rigor, lethargy, and tachycardia, though she was afebrile and no leukocytosis at that time.  A CT of her abdomen and pelvis was obtained as well as blood cultures (both on 06/06/17). CT demonstrated interval development of a new loculated 3 cm fluid collection in the right hepatic lobe, while the hepatic fluid collections remain stable in size. She also had chest CT which showed a moderate left pleural effusion (no change from prior). She as empirically restarted on ertapenem 1gm daily and daptomycin at 853mkg as she was on prior to oral regimen, and continued on fluconazole 80039maily.  She underwent a left thoracentesis on 4/5 with pigtail placement. The pleural fluid analysis c/w exudative and there were 1604 nucleated cells. No growth on cultures, pigtail removed on 06/09/17.  Blood cultures from 4/4, both periphery and Mediport grew VRE, and subsequently blood cultures on 4/6 and 4/8 were positive for VRE only Mediport.  Initially VRE bacteremia due to the pelvic abscesses, but now VRE seeded mediport which was removed on 06/10/17.   A PICC was placed on 06/13/17, once BldCx remained negative for 48 hours. SylShela Leffd a PEG placed 4/12 with  post procedure leukocytosis, this is now resolving. She has had persistent, severe pain around PEG site, palliative care and primary team have been addressing with pain medication adjustments and alternative modalities. A repeat CT of abdomen and pelvis from 4/15  demonstrated enlarging and new fluid collections c/w abscesses.  4/16: Daptomycin was increased to 10 mg/kg (rounded to 800 mg).  4/17: Patient underwent drain placements in hepatic and retrogastric fluid collections on 06/19/17.  06/21/17: CT of abdomen/pelvis showed gastric perforation and interval drainage of both perigastric and liver abscesses (drains remain in place). Patient was made NPO, on TPN for gut rest given gastric perforation.     4/17 cultures: Hepatic drainage grew 1+ VRE (S- Dapto dose dependent, Linezolid, Gentamycin), and both specimens from perigastric cultures grew C. Glabrata, sensitivities requested   4/26- glabrata sensitivities resulted, resistant to fluconazole and voriconazole.  Changed to IV caspofungin.          Subjective: Amanda Galloway is weepy, appears worried and anxious. She moans frequently during exam, when asked how she is feeling, replies "tired of everything", states she is anxious. . She c/o "some nausea", no vomiting or diarrhea, is receiving po (soft diet), not eating much, PEG clamped for an hour after she eats, then unclamped 4 hours.  Denies feverishness/chills.  C/O significant pain at PEG tube site and winces during abdominal exam.     ROS: Reviewed 6 systems in detail, see above    Current Meds:  Scheduled Meds:   insulin lispro  0-20 Units Subcutaneous Q6H    LORazepam  0.25 mg Intravenous Q12H    caspofungin  50 mg Intravenous Q24H    levothyroxine IV  69 mcg Intravenous Daily    heparin (porcine)  5,000 Units Subcutaneous TID    daptomycin IV  10 mg/kg Intravenous Q24H    ertapenem  1,000 mg Intravenous Q24H    lidocaine  1 patch Transdermal Q24H    lidocaine  1 patch Transdermal Q24H    bisacodyl  10 mg Rectal Daily     Continuous Infusions:   TPN (2 in 1) Adult Continuous      And    fat emulsion soybean oil      TPN (2 in 1) Adult Continuous 55 mL/hr at 07/03/17 1006    sodium chloride 3 mL/hr (07/03/17 0231)    HYDROmorphone       PRN Meds:.   calcium  carbonate  1,000 mg Oral BID PRN    HYDROmorphone PF  0.5 mg Intravenous Q1H PRN    Or    HYDROmorphone PF  1 mg Intravenous Q1H PRN    LORazepam  0.5 mg Oral Q6H PRN    heparin lock flush  50 Units Intracatheter PRN    ondansetron  4 mg Intravenous Q6H PRN     Objective:  BP: (96-115)/(53-73)   Temp:  [35.9 C (96.6 F)-36.5 C (97.7 F)]   Temp src: Temporal (05/01 1229)  Heart Rate:  [87-98]   Resp:  [16-18]   SpO2:  [93 %-98 %]     General Appearance: in bed, weepy, repeatedly saying she's tired  HEENT: Sclera anicteric, MMM  Pulm: diminished throughout, poor inspiratory effort, no wheezes or rhonchi  CV: RRR, S1S2, no M/R/G, LE edema bilaterally  Abdomen:  Distended, soft,  TTP, +bowel sounds, PEG tube clamped, Abdominal drains present: Right hepatic drain (bilous drainage to collection bag) and left abdominal (milky greenish-brown to drainage collection bag).  PEG tube site with erythema at insertion site,  drain sites benign. PEG tube buttons remain in  Extremities:WWP  Skin:Warm and dry; reddened skin b/l LEs  Neuro: alert and oriented to place and person, confused to time  Lines/Drains/Tubes: 4/9 PIV R wrist, PICC LUE site benight (4/11), PEG (4/12), Left abdominal drain, Right hepatic drain.         Recent Labs  Lab 07/03/17  0032 07/02/17  0200 07/01/17  0025   WBC 7.1 7.9 8.6   Hemoglobin 7.6* 7.8* 7.9*   Hematocrit 25* 25* 26*   Platelets 523* 515* 507*   Seg Neut % 64.1 68.2 69.1   Lymphocyte % 17.3 15.9 14.9   Monocyte % 9.7 8.5 8.1   Eosinophil % 6.9 5.3 5.8       Recent Labs  Lab 07/03/17  0032 07/02/17  0200 07/01/17  0025   Sodium 141 141 136   Potassium 3.6 3.9 3.7   CO2 '25 24 26   '$ UN 22* 18 21*   Creatinine 0.49* 0.51 0.49*   Glucose 120* 121* 120*   Calcium 9.1 8.7 9.3         Lab results: 07/01/17  0025 06/27/17  0414 06/24/17  0021 06/21/17  2356 06/20/17  0000   Total Protein 6.6 5.5* 5.7* 5.4* 5.7*   Albumin 2.8* 2.0* 2.2* 2.0* 2.1*   ALT 59* 33 69* 91* 45*   AST 70* 29 87* 144* 65*    Alk Phos 532* 437* 540* 594* 496*   Bilirubin,Total <0.2 <0.2 <0.2 <0.2 <0.2       Lab Results  Component Value Date/Time   CRP 125 (H) 06/28/2017 0020   CRP 282 (H) 06/18/2017 0102   CRP 248 (H) 06/16/2017 0218       Lab Results  Component Value Date/Time   Sedimentation Rate 63 (H) 06/28/2017 0020   Sedimentation Rate 54 (H) 06/18/2017 0102   Sedimentation Rate 65 (H) 06/13/2017 1807       Lab Results  Component Value Date/Time   CK 48 07/03/2017 0032   CK 48 06/26/2017 0013   CK 29 06/19/2017 0020       Micro:   1/25: Intraoperative bile: GS 0 pmns, no organisms, Cx: no growth  2/12: 1 set of blood cultures from the right Mediport (no peripheral bld was sent): positive for Enterobacter cloacae complex in 70 hrs, sensitive to zosyn.   2/18 abdominal GS had >25 PMNs, many GPC in pairs/chains, many GNB and cultures 4+ Enterobacter cloacae complex. The cultures also grew 1+ Candida glabrata   2/21: BCx from the Right peripheral IV, Mediport, L IJ:  No growth  05/03/17: IR-guided I&D with drain placement of the left upper quadrant collection: GS > 25 PMNs, many GNB and GN diplococci, Cultures with 4+ Enterobacter cloacae complex (resistant to Zosyn and CTX), C. Albicans and C. Glabrata, and Candida tropicalis  05/04/17:    1 set of blood cultures from left IJ: VRE at 15.3 TTP. Daptomycin sensitivities: MIC 70mg/ml (dose dependent),  Linezolid   1 set from periphery: VRE and Enterobacter cloacae at 14.8 TTP   05/05/17:  IR-guided I&D with drain placement of the intrahepatic collection: GS 1-10 PMNs, very few GPC in pairs. Fungal cultures with Candida glabrata  05/05/17: Blood cultures from periphery, Mediport and IJ: no growth  05/04/17: Urine cultures obtained (for unclear reason): no growth  05/08/17: Abscess GS: >25 PMNs, many GPC in chains, few GNB. Aerobic cultures growing 2+ Enterobacter (ertapenem, Cipro, TMP/SMX-sensitive) and 3+VRE, Fungal: C. Glabrata (sens  to caspo, vori and dose dependent sens to  fluconazole)  06/06/17: 2 sets of BCx (1 set each from periphery and Mediport) - taken after restarting ertapenem: IVAD grew VRE ttp 18.2 hours,  Periphery (Left arm) grew VRE ttp 17.3.  06/07/17 Pleural fluid (left) GS 1-10 PMN's, 1-10 Nucleated white cells, No organisms seen and 1604 nucleated cells. No growth on culture.   06/07/17 Abdominal abscess: labs cancelled - no specimens received.   06/08/17: Mediport BC + for VRE in 23 hrs (R-Amp, R- tetracycline per lab, suppressed result, R- doxycycline;  S-Linezolid, S-Dapto dose dependent),  Right hand peripheral cx: No growth   06/10/17: Blood culture 2/2: 1 periphery (RHand), 1 from Mediport before removed: no growth  06/11/17: Blood Culture 2/2 (both periphery) - no growth     06/19/17:  Perigastric fluid #1:  GS 10-25 PMNs -  GPB, GNB, GPC in pairs. 1+ C. Glabrata (R- fluconazole, voriconazole, S- Caspofungin); 2+ Enterococcus faecium (R- amp, PenG, Vanco; S- Linezolid)    06/19/17:  Liver fluid collection:  GS: 0 PMNs, Fungal and AFB stains negative, 1+ enterococcus faecium (R- Amp, PenG, Vanco, S- Dapto dose dependent, Linezolid)    06/19/17 : Perigastric fluid #2: GS: >25 PMNs, Gram negative bacilli; 3+ C. Glabrata.     Imaging/Other Relevant Diagnostics:   07/03/17 Abdominal CT: pending read    Assessment and Plan:  ID problem(s):   - s/p enterobacter cloacae bacteremia  - Multiple and extensive Intra-abdominal abscesses growing VRE, enterobacter and candida glabrata  - VRE Bacteremia     72 y.o. female with h/o recent PE and pancreatic cancer, s/p whipple procedure (goal of cure), complicated by delayed gastric emptying, elevated LFTs and sepsis, mild malnutrition 2/2 early satiety and anorexia (requiring PEG), and multiple intraabdominal and hepatic abscesses.      See HPI above for detailed history.    Aislee c/o abdominal pain, most severe at PEG site.  She is eating very small amounts, mild nausea but no emesis, PEG is clamped for an hour after eating.  Abdominal drains  intact, left drain with small amount purulent appearing greenish/brown drainage, right with small bilious.   She had an abdominal CT today, results pending.  Her perigastric cultures resulted with fluconazole and voriconazole resistant C. Glabrata and we changed her from fluconazole to IV Caspofungin as a result on 06/28/17.  Her white count has down trended to WNL and she is afebrile. .       PEG tube site with erythema at insertion site (wound care to see today), drain sites benign. PEG tube buttons remain in, have not fallen out yet. Generally these can be lifted and sutures cut if theydo not fall out on their own after a week.  which may possibly help ease some of patient's discomfort.     No change in antibiotic plan at this time. Will f/u on abd CT results.     Recommendations:   Continue Daptomycin to '10mg'$ /kg daily (round to 800 mg) for VRE coverage, IV Ertapenem 1 gram daily for enterobacter and anaerobe coverage, and IV Caspofungin 50 mg/daily for C.glabrata coverage. Duration TBD on clinical course and resolution of fluid collections.    Follow Abdominal imaging report   Follow weekly CBC with diff, CMP, CK (while on Daptomycin), CRP, and ESR.    ID team 3 will continue to follow along     Thank you for allowing [name] to participate in the care of this patient  Please call with questions/concerns.  Lonzo Candy, ANP-BC  Infectious Diseases, Team 3  Pager [phone]  Office Ph [phone]

## 2017-07-04 ENCOUNTER — Other Ambulatory Visit: Payer: Self-pay | Admitting: Cardiology

## 2017-07-04 ENCOUNTER — Inpatient Hospital Stay: Payer: Medicare (Managed Care)

## 2017-07-04 DIAGNOSIS — J811 Chronic pulmonary edema: Secondary | ICD-10-CM

## 2017-07-04 DIAGNOSIS — T82898A Other specified complication of vascular prosthetic devices, implants and grafts, initial encounter: Secondary | ICD-10-CM

## 2017-07-04 LAB — CBC AND DIFFERENTIAL
Baso # K/uL: 0.1 10*3/uL (ref 0.0–0.1)
Basophil %: 0.7 %
Eos # K/uL: 0.4 10*3/uL (ref 0.0–0.4)
Eosinophil %: 2.3 %
Hematocrit: 28 % — ABNORMAL LOW (ref 34–45)
Hemoglobin: 8.7 g/dL — ABNORMAL LOW (ref 11.2–15.7)
IMM Granulocytes #: 0.1 10*3/uL
IMM Granulocytes: 0.6 %
Lymph # K/uL: 1.1 10*3/uL — ABNORMAL LOW (ref 1.2–3.7)
Lymphocyte %: 7 %
MCH: 28 pg/cell (ref 26–32)
MCHC: 31 g/dL — ABNORMAL LOW (ref 32–36)
MCV: 90 fL (ref 79–95)
Mono # K/uL: 0.6 10*3/uL (ref 0.2–0.9)
Monocyte %: 3.9 %
Neut # K/uL: 13.7 10*3/uL — ABNORMAL HIGH (ref 1.6–6.1)
Nucl RBC # K/uL: 0 10*3/uL (ref 0.0–0.0)
Nucl RBC %: 0 /100 WBC (ref 0.0–0.2)
Platelets: 572 10*3/uL — ABNORMAL HIGH (ref 160–370)
RBC: 3.1 MIL/uL — ABNORMAL LOW (ref 3.9–5.2)
RDW: 17.2 % — ABNORMAL HIGH (ref 11.7–14.4)
Seg Neut %: 85.5 %
WBC: 16.1 10*3/uL — ABNORMAL HIGH (ref 4.0–10.0)

## 2017-07-04 LAB — ART GASES / WHOLE BLOOD PANEL
Base Excess, Arterial: 1 mmol/L (ref <=2)
CO2,ART (Calc): 26 mmol/L (ref 23–28)
CO: 0.8 %
FO2 Hb, Arterial: 95 % (ref 90–100)
Glucose,WB: 131 mg/dL — ABNORMAL HIGH (ref 60–99)
HCO3, Arterial: 25 mmol/L (ref 21–26)
Hemoglobin: 9.1 g/dL — ABNORMAL LOW (ref 11.2–15.7)
ICA @7.4,WB: 4.8 mg/dL (ref 4.8–5.2)
ICA Uncorr,WB: 4.7 mg/dL
Lactate ART,WB: 1.7 mmol/L — ABNORMAL HIGH (ref 0.3–0.8)
Methemoglobin: 0.1 % (ref 0.0–1.0)
NA, WB: 137 mmol/L (ref 135–145)
Potassium,WB: 3.8 mmol/L (ref 3.4–4.7)
pCO2, Arterial: 35 mm Hg (ref 35–46)
pH: 7.47 — ABNORMAL HIGH (ref 7.35–7.45)
pO2,Arterial: 87 mm Hg (ref 80–100)

## 2017-07-04 LAB — VENOUS BLOOD GAS
Base Excess,VENOUS: 2 mmol/L (ref <=2)
Bicarbonate,VENOUS: 26 mmol/L (ref 21–28)
CO2 (Calc),VENOUS: 27 mmol/L (ref 22–31)
CO: 0.5 %
FO2 HB,VENOUS: 41 % — ABNORMAL LOW (ref 63–83)
Hemoglobin: 9.1 g/dL — ABNORMAL LOW (ref 11.2–15.7)
Methemoglobin: 0.6 % (ref 0.0–1.0)
PCO2,VENOUS: 40 mm Hg (ref 40–50)
PH,VENOUS: 7.43 — ABNORMAL HIGH (ref 7.32–7.42)
PO2,VENOUS: 28 mm Hg (ref 25–43)

## 2017-07-04 LAB — PHOSPHORUS: Phosphorus: 4.7 mg/dL — ABNORMAL HIGH (ref 2.7–4.5)

## 2017-07-04 LAB — URINALYSIS REFLEX TO CULTURE
Ketones, UA: NEGATIVE
Leuk Esterase,UA: NEGATIVE
Nitrite,UA: NEGATIVE
Protein,UA: NEGATIVE mg/dL
Specific Gravity,UA: 1.019 (ref 1.002–1.030)
pH,UA: 6 (ref 5.0–8.0)

## 2017-07-04 LAB — PLATELET COUNT: Platelets: 510 10*3/uL — ABNORMAL HIGH (ref 160–370)

## 2017-07-04 LAB — BASIC METABOLIC PANEL
Anion Gap: 14 (ref 7–16)
CO2: 24 mmol/L (ref 20–28)
Calcium: 9.2 mg/dL (ref 8.6–10.2)
Chloride: 98 mmol/L (ref 96–108)
Creatinine: 0.48 mg/dL — ABNORMAL LOW (ref 0.51–0.95)
GFR,Black: 114 *
GFR,Caucasian: 99 *
Glucose: 119 mg/dL — ABNORMAL HIGH (ref 60–99)
Lab: 18 mg/dL (ref 6–20)
Potassium: 4.1 mmol/L (ref 3.3–5.1)
Sodium: 136 mmol/L (ref 133–145)

## 2017-07-04 LAB — URINE MICROSCOPIC (IQ200)
RBC,UA: 2 /hpf (ref 0–2)
WBC,UA: <1 /hpf (ref 0–5)

## 2017-07-04 LAB — APTT: aPTT: 111.8 s — ABNORMAL HIGH (ref 25.8–37.9)

## 2017-07-04 LAB — MAGNESIUM: Magnesium: 1.4 mg/dL — ABNORMAL LOW (ref 1.6–2.5)

## 2017-07-04 LAB — POCT GLUCOSE
Glucose POCT: 128 mg/dL — ABNORMAL HIGH (ref 60–99)
Glucose POCT: 141 mg/dL — ABNORMAL HIGH (ref 60–99)
Glucose POCT: 153 mg/dL — ABNORMAL HIGH (ref 60–99)
Glucose POCT: 117 mg/dL — ABNORMAL HIGH (ref 60–99)

## 2017-07-04 LAB — MRSA (ORSA) AMPLIFICATION: MRSA (ORSA) Amplification: 0

## 2017-07-04 MED ORDER — VANCOMYCIN HCL IN D5W 1500 MG/250 ML IV SOLN *I*
1500.0000 mg | Freq: Once | INTRAVENOUS | Status: AC
Start: 2017-07-04 — End: 2017-07-04
  Administered 2017-07-04: 1500 mg via INTRAVENOUS
  Filled 2017-07-04: qty 250

## 2017-07-04 MED ORDER — HEPARIN INFUSION 100 UNITS/ML (BOLUS FROM BAG) *I*
0.0000 [IU] | INTRAVENOUS | Status: DC | PRN
Start: 2017-07-04 — End: 2017-07-06
  Filled 2017-07-04: qty 250

## 2017-07-04 MED ORDER — HEPARIN INFUSION 100 UNITS/ML (NEUR PROTOCOL) *I*
0.0000 [IU]/h | INTRAVENOUS | Status: DC
Start: 2017-07-04 — End: 2017-07-04
  Administered 2017-07-04: 900 [IU]/h
  Administered 2017-07-04: 900 [IU]/h via INTRAVENOUS
  Administered 2017-07-04 (×3): 900 [IU]/h
  Filled 2017-07-04: qty 250

## 2017-07-04 MED ORDER — STERILE WATER FOR INJECTION (TPN USE ONLY) *I*
INTRAVENOUS | Status: DC
Start: 2017-07-04 — End: 2017-07-05
  Filled 2017-07-04: qty 563.2

## 2017-07-04 MED ORDER — HEPARIN INFUSION 100 UNITS/ML (BOLUS FROM BAG) *I*
80.0000 [IU]/kg | Freq: Once | INTRAVENOUS | Status: AC
Start: 2017-07-04 — End: 2017-07-04
  Administered 2017-07-04: 6384 [IU] via INTRAVENOUS
  Filled 2017-07-04: qty 250

## 2017-07-04 MED ORDER — MAGNESIUM SULFATE 2 GM IN 50 ML *WRAPPED*
2000.0000 mg | Freq: Once | INTRAVENOUS | Status: AC
Start: 2017-07-04 — End: 2017-07-04
  Administered 2017-07-04: 2000 mg via INTRAVENOUS
  Filled 2017-07-04: qty 50

## 2017-07-04 MED ORDER — LINEZOLID 600 MG/300ML IV SOLN *I*
600.0000 mg | Freq: Two times a day (BID) | INTRAVENOUS | Status: DC
Start: 2017-07-04 — End: 2017-07-22
  Administered 2017-07-04 – 2017-07-22 (×35): 600 mg via INTRAVENOUS
  Filled 2017-07-04 (×40): qty 300

## 2017-07-04 MED ORDER — FAT EMULSION SOYBEAN OIL 20 % IV EMUL *WRAPPED*
54.0000 g | Freq: Every day | INTRAVENOUS | Status: AC
Start: 2017-07-04 — End: 2017-07-05
  Administered 2017-07-04: 54 g via INTRAVENOUS
  Filled 2017-07-04: qty 270

## 2017-07-04 MED ORDER — MEROPENEM 1 GM IN 110 ML NACL MINI BAG *I*
1000.0000 mg | Freq: Three times a day (TID) | INTRAVENOUS | Status: DC
Start: 2017-07-04 — End: 2017-07-10
  Administered 2017-07-04 – 2017-07-10 (×19): 1000 mg via INTRAVENOUS
  Filled 2017-07-04 (×25): qty 100

## 2017-07-04 MED ORDER — HEPARIN INFUSION 100 UNITS/ML (DVT-PE PROTOCOL) *I*
0.0000 [IU]/h | INTRAVENOUS | Status: DC
Start: 2017-07-04 — End: 2017-07-06
  Administered 2017-07-04 (×2): 1400 [IU]/h
  Administered 2017-07-04: 1250 [IU]/h via INTRAVENOUS
  Administered 2017-07-04: 1400 [IU]/h
  Administered 2017-07-04: 1250 [IU]/h via INTRAVENOUS
  Administered 2017-07-04: 1250 [IU]/h
  Administered 2017-07-04: 1400 [IU]/h
  Administered 2017-07-04: 1250 [IU]/h
  Administered 2017-07-04 (×2): 1400 [IU]/h
  Administered 2017-07-04: 1400 [IU]/h via INTRAVENOUS
  Administered 2017-07-04: 1250 [IU]/h
  Administered 2017-07-04 (×3): 1400 [IU]/h
  Administered 2017-07-04: 1250 [IU]/h
  Administered 2017-07-04: 1400 [IU]/h
  Administered 2017-07-04 – 2017-07-05 (×5): 1250 [IU]/h

## 2017-07-04 MED ORDER — SODIUM CHLORIDE 0.9 % IV SOLN WRAPPED *I*
125.0000 mL/h | Status: DC
Start: 2017-07-04 — End: 2017-07-04
  Administered 2017-07-04: 125 mL/h
  Administered 2017-07-04: 125 mL/h via INTRAVENOUS
  Administered 2017-07-04: 125 mL/h
  Administered 2017-07-04: 125 mL/h via INTRAVENOUS
  Administered 2017-07-04: 125 mL/h

## 2017-07-04 NOTE — Progress Notes (Signed)
Assumed care of patient at 1900 today.  VSS, A&Ox3, tolerating dysphagia I diet but only eating 50% of meals.  Patient was ambulating OOB to commode to void x2.  Tolerating clamping the PEG for 4 hours periods  with no N/V.  Pain was constant in her abdomen rating it 7/10 before administering PCA dose.  Patient is using her PCA appropriately with reminding.  Flushing her drains is very painful with no output.      Patient became Sinus Tachy around 2200 and continued to worsen (see VS below).  Amy Alvira Monday was notified and came to bedside.  Fluids were started.  Waiting for Heparin to arrive to start drip.  EKG was done.  Chest xray was done.  O2 therapy was increased to 35% w/ 10 L.  Patient was breathing very shallow and became very tense and rigid.  A&O x3 but slow to respond.  Patient was transferred to ICU unit [id number]. Handoff was given to [facility]       Doralee Albino, RN       07/03/17 1952 07/03/17 2200 07/03/17 2303   Vital Signs   Temp 36.2 C (97.2 F) 36.2 C (97.2 F) --    Temp src TEMPORAL TEMPORAL TEMPORAL   Heart Rate 99 101 (!) 125   Heart Rate Source Automated/oximeter Automated/oximeter --    Resp 16 18 --    BP 123/61 --  --    BP Method Automatic Automatic --    BP Location Right arm Right arm --    Patient Position Lying Lying --    Oxygen Therapy   SpO2 91 % 92 % (!) 83 %   Oximetry Source Lt Hand Lt Hand --    O2 Device None (Room air) None (Room air) O2 Therapy   O2 Therapy --  --  --    FiO2 --  --  --    O2 Flow Rate --  --  1 L/min       07/03/17 2305 07/03/17 2307 07/03/17 2316   Vital Signs   Temp 36.4 C (97.5 F) --  --    Temp src TEMPORAL --  --    Heart Rate (!) 127 --  --    Heart Rate Source Automated/oximeter --  --    Resp (!) 30 --  --    BP 142/88 --  --    BP Method Automatic --  --    BP Location Right arm --  --    Patient Position Lying --  --    Oxygen Therapy   SpO2 (!) 85 % 90 % (!) 88 %   Oximetry Source --  --  --    O2 Device O2 Therapy O2 Therapy O2 Therapy    O2 Therapy --  --  --    FiO2 --  --  --    O2 Flow Rate 3 L/min 4 L/min 6 L/min       07/03/17 2329 07/04/17 0002   Vital Signs   Temp --  36.3 C (97.3 F)   Temp src --  TEMPORAL   Heart Rate --  (!) 133   Heart Rate Source --  Automated/oximeter   Resp --  19   BP --  143/74   BP Method --  Automatic   BP Location --  --    Patient Position --  Lying   Oxygen Therapy   SpO2 90 % 94 %   Oximetry Source --  --  O2 Device O2 Therapy O2 Therapy   O2 Therapy Aerosol mask Aerosol mask   FiO2 35 % 35 %   O2 Flow Rate 10 L/min 10 L/min

## 2017-07-04 NOTE — Progress Notes (Addendum)
Infectious Diseases (Team 3) Follow Up Note    Personally reviewed chart, labs, medications. Patient seen.     CC:  F/U VRE bacteremia, Liver and abdominal abscesses, s/p Enterobacter cloacae bacteremia, Intra-abdominal abscesses positive for VRE, enterobacter and candida glabrata    Subjective: Patient weepy, daughter at bedside.  No N/V, NPO currently, continues on TPN.  BP slightly soft, not on pressors.  WOB on 3 liters unlabored with O2 sats 98%.  Daughter endorses patient had loose cough yesterday evening, no cough today.  Foley cath with clear yellow urine.  Right abd drain (live) with clear bilious drainage, left abd drain with milky tan (both bags just emptied, RN reports each with about 30 cc over last shift).  Patient continues with abdominal pain on exam.     ROS: Reviewed 6 systems in detail, see above    Current Meds:  Scheduled Meds:   meropenem  1,000 mg Intravenous Q8H    vancomycin  1,500 mg Intravenous Once    linezolid  600 mg Intravenous Q12H    insulin lispro  0-20 Units Subcutaneous Q6H    caspofungin  50 mg Intravenous Q24H    levothyroxine IV  69 mcg Intravenous Daily    lidocaine  1 patch Transdermal Q24H    lidocaine  1 patch Transdermal Q24H    bisacodyl  10 mg Rectal Daily     Continuous Infusions:   TPN (2 in 1) Adult Continuous      And    fat emulsion soybean oil      heparin 1,400 Units/hr (07/04/17 1259)    TPN (2 in 1) Adult Continuous 55 mL/hr at 07/04/17 1259    HYDROmorphone       PRN Meds:.   calcium carbonate  1,000 mg Oral BID PRN    HYDROmorphone PF  0.5 mg Intravenous Q1H PRN    Or    HYDROmorphone PF  1 mg Intravenous Q1H PRN    LORazepam  0.5 mg Oral Q6H PRN    heparin lock flush  50 Units Intracatheter PRN    ondansetron  4 mg Intravenous Q6H PRN     Objective:  BP: (93-148)/(42-88)   Temp:  [36.1 C (97 F)-39.6 C (103.2 F)]   Temp src: Axillary (05/02 1200)  Heart Rate:  [94-141]   Resp:  [15-44]   SpO2:  [83 %-98 %]     General Appearance: ill  appearing female laying in bed, weeping,  HEENT: Sclera anicteric, MMM  Pulm: diminished throughout, poor inspiratory effort, no wheezes or rhonchi  CV: RRR, S1S2, no M/R/G, LE edema bilaterally unchanged from yesterday  Abdomen:  Distended, soft lower quad, semi-firm upper quads,  TTP, +bowel sounds, PEG tube clamped, Abdominal drains present: Right hepatic drain (bilious drainage to collection bag) and left abdominal (milky greenish-brown to drainage collection bag).  PEG tube site with erythema at insertion site, drain sites benign. PEG tube buttons remain in, PEG movement at insertion site.   Extremities:WWP  Skin:Warm and dry  Neuro: alert, appropriately answering questions, introducing care providers to her daughter  Lines/Drains/Tubes: 4/9 PIV R wrist, PICC LUE site benigh (4/11), PEG (4/12), Left abdominal drain, Right hepatic drain.         Recent Labs  Lab 07/04/17  1046 07/04/17  0043 07/04/17  0037 07/04/17  0029 07/03/17  0032 07/02/17  0200   WBC  --   --   --  16.1* 7.1 7.9   Hemoglobin  --  9.1* 9.1*  8.7* 7.6* 7.8*   Hematocrit  --   --   --  28* 25* 25*   Platelets 510*  --   --  572* 523* 515*   Seg Neut %  --   --   --  85.5 64.1 68.2   Lymphocyte %  --   --   --  7.0 17.3 15.9   Monocyte %  --   --   --  3.9 9.7 8.5   Eosinophil %  --   --   --  2.3 6.9 5.3       Recent Labs  Lab 07/04/17  0029 07/03/17  0032 07/02/17  0200   Sodium 136 141 141   Potassium 4.1 3.6 3.9   CO2 _0 UN 18 22* 18   Creatinine 0.48* 0.49* 0.51   Glucose 119* 120* 121*   Calcium 9.2 9.1 8.7         Lab results: 07/01/17  0025 06/27/17  0414 06/24/17  0021 06/21/17  2356 06/20/17  0000   Total Protein 6.6 5.5* 5.7* 5.4* 5.7*   Albumin 2.8* 2.0* 2.2* 2.0* 2.1*   ALT 59* 33 69* 91* 45*   AST 70* 29 87* 144* 65*   Alk Phos 532* 437* 540* 594* 496*   Bilirubin,Total <0.2 <0.2 <0.2 <0.2 <0.2       Lab Results  Component Value Date/Time   CRP 125 (H) 06/28/2017 0020   CRP 282 (H) 06/18/2017 0102   CRP 248 (H) 06/16/2017  0218       Lab Results  Component Value Date/Time   Sedimentation Rate 63 (H) 06/28/2017 0020   Sedimentation Rate 54 (H) 06/18/2017 0102   Sedimentation Rate 65 (H) 06/13/2017 1807       Lab Results  Component Value Date/Time   CK 48 07/03/2017 0032   CK 48 06/26/2017 0013   CK 29 06/19/2017 0020       Micro:   1/25: Intraoperative bile: GS 0 pmns, no organisms, Cx: no growth  2/12: 1 set of blood cultures from the right Mediport (no peripheral bld was sent): positive for Enterobacter cloacae complex in 70 hrs, sensitive to zosyn.   2/18 abdominal GS had >25 PMNs, many GPC in pairs/chains, many GNB and cultures 4+ Enterobacter cloacae complex. The cultures also grew 1+ Candida glabrata   2/21: BCx from the Right peripheral IV, Mediport, L IJ:  No growth  05/03/17: IR-guided I&D with drain placement of the left upper quadrant collection: GS > 25 PMNs, many GNB and GN diplococci, Cultures with 4+ Enterobacter cloacae complex (resistant to Zosyn and CTX), C. Albicans and C. Glabrata, and Candida tropicalis  05/04/17:    1 set of blood cultures from left IJ: VRE at 15.3 TTP. Daptomycin sensitivities: MIC 41mg/ml (dose dependent),  Linezolid   1 set from periphery: VRE and Enterobacter cloacae at 14.8 TTP   05/05/17:  IR-guided I&D with drain placement of the intrahepatic collection: GS 1-10 PMNs, very few GPC in pairs. Fungal cultures with Candida glabrata  05/05/17: Blood cultures from periphery, Mediport and IJ: no growth  05/04/17: Urine cultures obtained (for unclear reason): no growth  05/08/17: Abscess GS: >25 PMNs, many GPC in chains, few GNB. Aerobic cultures growing 2+ Enterobacter (ertapenem, Cipro, TMP/SMX-sensitive) and 3+VRE, Fungal: C. Glabrata (sens to caspo, vori and dose dependent sens to fluconazole)  06/06/17: 2 sets of BCx (1 set each from periphery and Mediport) - taken after restarting ertapenem:  IVAD grew VRE ttp 18.2 hours,  Periphery (Left arm) grew VRE ttp 17.3.  06/07/17 Pleural fluid (left) GS 1-10 PMN's,  1-10 Nucleated white cells, No organisms seen and 1604 nucleated cells. No growth on culture.   06/07/17 Abdominal abscess: labs cancelled - no specimens received.   06/08/17: Mediport BC + for VRE in 23 hrs (R-Amp, R- tetracycline per lab, suppressed result, R- doxycycline;  S-Linezolid, S-Dapto dose dependent),  Right hand peripheral cx: No growth   06/10/17: Blood culture 2/2: 1 periphery (RHand), 1 from Mediport before removed: no growth  06/11/17: Blood Culture 2/2 (both periphery) - no growth     06/19/17:  Perigastric fluid #1:  GS 10-25 PMNs -  GPB, GNB, GPC in pairs. 1+ C. Glabrata (R- fluconazole, voriconazole, S- Caspofungin); 2+ Enterococcus faecium (R- amp, PenG, Vanco; S- Linezolid)  06/19/17:  Liver fluid collection:  GS: 0 PMNs, Fungal and AFB stains negative, 1+ enterococcus faecium (R- Amp, PenG, Vanco, S- Dapto dose dependent, Linezolid)  06/19/17 : Perigastric fluid #2: GS: >25 PMNs, Gram negative bacilli; 3+ C. Glabrata.     07/04/17 U/A negative  07/04/17 Blood Cultures 3/3 (1 set PICC proximal port, 2 sets periphery): NGTD  07/04/17 Sputum: To be collected    Imaging/Other Relevant Diagnostics:     07/03/17 Abdominal/Pelvis contrasted CT:   Patient is post Whipple procedure with CT findings of multiple intra-abdominal/pelvic fluid collections. Unchanged pigtail catheter in the right posterior liver lobe for drainage of prior liver abscess/biloma with interval resolution.   Persistent fluid collection inferior from the area of the pigtail catheter right liver lobe, as above.  Persistent left upper quadrant surgical drain with the pigtail catheter tip in the prior focus of the previously multiloculated fluid collection. The inferior aspect of this collection is resolved. Persistent rim-enhancing fluid collection at the posterior aspect of the stomach which demonstrates interval decrease in size compared to the CT scan from 06/17/2017.  Interval decrease in the multiloculated left upper quadrant fluid collection  along superior pole of the spleen extending from the anterior left subphrenic region along the diaphragm to the splenic flexure of the colon.  No CT evidence of extravasation of oral contrast concerning for gastric perforation.  Interval resolution of the previously visualized 2 fluid pelvic collections.  Bilateral small pleural effusions with associated compressive atelectasis, unchanged.     07/04/17 CXR:    Moderate bilateral pleural effusions with bibasilar opacities which may represent atelectasis or pneumonia. Superimposed pulmonary edema.     Assessment and Plan:  ID problem(s):   - s/p enterobacter cloacae bacteremia  - Multiple and extensive Intra-abdominal abscesses growing VRE, enterobacter and candida glabrata  - VRE Bacteremia     72 y.o. female with h/o recent PE and pancreatic cancer, s/p whipple procedure (goal of cure), complicated by delayed gastric emptying, elevated LFTs and sepsis, mild malnutrition 2/2 early satiety and anorexia (requiring PEG), and multiple intraabdominal and hepatic abscesses.      Launa was transferred to SICU overnight d/t respiratory decompensation, new onset leukocytosis and fever Tmax 103.2.  She was on Dapto (90m/kg), Ertapenem, and Caspofungin at that time, was given dose of Vancomycin and Ertapenem changed to meropenum by ICU given patients change in status.      The change in patient status is suggestive of active infection, however pneumonia is low on differential given absence of respiratory symptoms (cough/dyspnea), CXR review,  and O2 demands seem to be quickly resolving.  It is not clear if sepsis 2/2  intraabdominal source is the culprit however CT of a/p from yesterday showed collections have overall improved and liver abscess successfully drained (this drain could be pulled).   There remains a persistent fluid collection inferior to the current pigtail in right liver lobe which needs to be drained for source control.  Additionally patient's daughter endorses  Lyah has had dysuria symptoms, particularly burning on urination, for a few days prior.     Recommendations:   Pan culture: blood (periphery and all lines), urine, sputum    Persistent liver fluid collection drainage if amenable to pigtail. The current liver drain can likely be removed    If team wishes to broaden antibiotics:    Start Linezolid 600 mg IV twice daily (in place of Daptomycin) watching carefully for serotonin syndrome given she was on methadone (discontinued 10 days ago)   Continue meropenem 1 gram IV every 8 hours    Continue caspofungin 50 mg IV daily   Follow cultures   Follow daily CBC with diff and BMP   Follow weekly CK (while on Daptomycin), can D/C if switch to Linezolid   Follow weekly CRP, and ESR.    ID team 3 will continue to follow along     Patient seen with and plan discussed with ID attending Dr. Morrell Riddle.    Thank you for allowing [name] to participate in the care of this patient  Please call with questions/concerns.     Lonzo Candy, ANP-BC  Infectious Diseases, Team 3  Pager [phone]  Office Ph [phone]    ID Attending Addendum: I personally saw examined the patient and discussed the case in detail with Ms. Davis.  The note was reviewed and I agree with the details of the physical exam findings and record of pertinent laboratory and diagnostic data. I concur with the assessment and recommendations which reflect our discussion. Clinical decompensation overnight though appears to be back to her baseline. No respiratory symptoms so pneumonia seems unlikely. Her CT abdomen was reviewed and shows marked improvement in all of the collections except the inferior liver abscess which should be drained. Would send urine and blood cultures from the periphery and all her lines. If negative would resume her previous antibiotic regimen.     Zenovia Jarred, MD  ID Attending, pager [phone]  Jul 04, 2017 3:59 PM

## 2017-07-04 NOTE — Progress Notes (Signed)
Surgical Oncology/Hepatobiliary Surgery Progress Note     LOS: 97 days     Subjective:  Interval Events:     Overnight with tachypnea, hypoxemia, and tachycardia.  Required aerosol face mask. Febrile to 39  C/o of more abdominal pain this AM    Objective:  Vitals Sign Ranges for Past 24 Hours:  BP: (97-148)/(42-88)   Temp:  [36.1 C (97 F)-39.6 C (103.2 F)]   Temp src: Axillary (05/02 0800)  Heart Rate:  [94-141]   Resp:  [16-44]   SpO2:  [83 %-97 %]     Physical Exam:  General Appearance: Resting in bed, tearful  Cardiac: regular rate  Respiratory: non-labored breathing on room air  Abdomen: Soft, non distended, G tube site c/d/i, tender surrounding tube. Perc site in hepatic abscess with clear bilious drainage. Perc drain in abdominal collection with minimal milky output. PEG to gravity  Extremities: warm, well perfused    Labs:    CBC:    Recent Labs  Lab 07/04/17  1046 07/04/17  0043  07/04/17  0029 07/03/17  0032 07/02/17  0200 07/01/17  0025 06/30/17  0100 06/29/17  0000   WBC  --   --   --  16.1* 7.1 7.9 8.6 8.1 8.6   Hemoglobin  --  9.1*  < > 8.7* 7.6* 7.8* 7.9* 7.9* 7.3*   Hematocrit  --   --   --  28* 25* 25* 26* 25* 23*   Platelets 510*  --   --  572* 523* 515* 507* 470* 449*   < > = values in this interval not displayed.    Metabolic Panel:    Recent Labs  Lab 07/04/17  0029 07/03/17  0032 07/02/17  0200 07/01/17  0025 06/30/17  0100 06/29/17  0000   Sodium 136 141 141 136 135 136   Potassium 4.1 3.6 3.9 3.7 3.7 4.0   Chloride 98 102 102 98 97 99   CO2 _0 UN 18 22* 18 21* 16 17   Creatinine 0.48* 0.49* 0.51 0.49* 0.51 0.49*   Glucose 119* 120* 121* 120* 123* 114*   Calcium 9.2 9.1 8.7 9.3 9.0 8.8   Magnesium 1.4* 1.7 1.8 1.6 1.6 1.7   Phosphorus 4.7* 4.7* 4.8* 4.8* 4.9* 4.9*          Assessment:  72 y.o. female with h/o recent PE and pancreatic cancer POD # 97  status post whipple procedure complicated by delayed gastric emptying.  NGT replaced on 04/04/17. UGI on 04/12/17 concerning for  obstruction just beyond the [place] in the efferent limb.  Course complicated on 2/99/24 by rising LFTs and sepsis with CT concerning for cholangitis given biliary tree obstruction and PV compression due to hematoma and afferent limb distention in setting of PE treatment. Is s/p IR PTC on 04/16/17 and IR percutaneous aspiration of anterior abdominal fluid collection on 04/24/17. IR LUQ perc drain placement 3/1. IR anterior abdominal 30f perc drain placement 3/3. IR anterior perc drain into left hepatic collection 3/6.     Plan:  - Keep NPO for now. Cont TPN for nutritional support. Keep G-tube to gravity  - CT on 4/27 without evidence of oral contrast extravasation. CT on 5/1 without significant intra-abdominal collections. Persistent hepatic collection largely unchanged in size. ?unclear if overnight decompensation is from intra-abdominal source.  - Appreciate palliative care recommendations. Multimodal pain management  - Continue IV antibiotic regimen per ID Recommendations. F/u with ID re  likely septic episode and further w/u  - Monitor perc drain outputs.   - hx of PE. Consider empiric anticoagulation for now with hep gtt. ?PE as etiology of decompensation although appears more like sepsis at present  - SCDs, SQ heparin. Consider hep gtt  - Dispo: Pending clinical course. Will eventually require SNF     Belinda Fisher, MD  07/04/2017     11:40 AM   General Surgery Resident

## 2017-07-04 NOTE — Progress Notes (Signed)
SICU History and Physical Note    Date seen: 07/04/17    CC: sepsis    HPI: Amanda Galloway is a [name] w/ PMH fibromyalgia, pancreatic CA s/p Whipple for 'cure' 6/43/32 with complicated prolonged course including ICU & intub (extub 2/17), mult IR procedures for drainage of abscesses, VRE bacteremia w/ mediport removal 4/8, s/p R pleural pigtail; s/p PICC after neg bld cx; s/p IR PEG on 4/12, and follow up CT & UGI w/ revealing posterior gastric perforation, s/p IR perc drain x 2 when 4/19 CT revealed oral contrast passing into lesser sac/retroperitoneum. SICU was consulted on 5/2 for increased O2 requirements and tachycardia. Patient found to be febrile to 39.6 with rising WBC, admitted to SICU with concern for sepsis.     ROS : Review of Systems   Unable to perform ROS: Critical illness     Past Medical History:  Past Medical History:   Diagnosis Date    Acute kidney failure 03/31/2017    Cancer     Depression     Fibromyalgia     Hypothyroidism        Past Surgical History:  Past Surgical History:   Procedure Laterality Date    CHOLECYSTECTOMY      CHOLECYSTECTOMY, LAPAROSCOPIC  09/06/2016    HYSTERECTOMY  08/1986    Fibroids    KNEE SURGERY Right     PR INSERT TUNNELED CV CATH WITH PORT Right 10/04/2016    Procedure: Right IJ MEDIPORT Insertion;  Surgeon: Delano Metz, MD;  Location[facility] MAIN OR;  Service: Oncology General    PR LAP,DIAGNOSTIC ABDOMEN N/A 10/04/2016    Procedure: LAPAROSCOPY DIAGNOSTIC;  Surgeon: Delano Metz, MD;  Location: [facility] MAIN OR;  Service: Oncology General    PR PART Vina PANC,PROX+REMV DUOD+ANAST N/A 03/29/2017    Procedure: WHIPPLE PROCEDURE;  Surgeon: Delano Metz, MD;  Location[facility] MAIN OR;  Service: Oncology General    ROTATOR CUFF REPAIR Right     TONSILLECTOMY AND ADENOIDECTOMY         No Known Allergies (drug, envir, food or latex)    Social Hx:  Social History     Social History    Marital status: Divorced     Spouse name: N/A    Number of children: N/A    Years of  education: N/A     Occupational History    Not on file.     Social History Main Topics    Smoking status: Never Smoker    Smokeless tobacco: Never Used    Alcohol use No    Drug use: No    Sexual activity: Not on file     Social History Narrative    No narrative on file         Family Hx:   Family History   Problem Relation Age of Onset    Breast cancer Mother         died 55    Cancer Father         NHL, died 10    Heart Disease Father     Diabetes Sister     Obesity Sister         4 siblings       Physical Exam by Systems:  Constitutional: laying bed, mild tachypnea   Ears, Nose, Mouth, and Throat: MMM  Cardiovascular: RRR, sinus tachy on tele  Respiratory: clear, diminished, bilateral anterior  Gastrointestinal: abd soft, ND, NT, perc drain x 2, PEG intact  Genitourinary: foley in place  Musculoskeletal: MAE  Skin: WWP  Neurologic: Alert and interactive, not oriented  Psychiatric: calm and cooperative    Assessment and Plan for Active/Followed Hospital Problems:   Active Hospital Problems    Diagnosis    *!*Pancreatic adenocarcinoma s/p Whipple 03/29/17     - Whipple on 1/25 with Dr. Ron Agee  [facility] course c/b gastric outlet obstruction d/t severe compression of the main portal vein and common bile duct by hematoma, ICU & intub (extub 2/17), mult IR procedures for drainage of abscesses, VRE bacteremia w/ mediport removal 4/8, s/p R pleural pigtail; s/p PICC after neg bld cx; s/p IR PEG on 4/12, and follow up CT & UGI w/ revealing posterior gastric perforation, s/p IR perc drain x 2 & 4/19 CT revealed oral contrast passing into lesser sac/retroperitoneum  - Continue TPN, with SSI for glucose control  - ID following for multiple intra-abdominal fluid collections   - Currently cultures + VRE, C.Glabrata and Enterobacter -- On Caspofungin, Daptomycin, Ertapenem       Sepsis     - Febrile to 39.2, WBC 16.1 (7.1)  - On caspo, ertapenem, and dapto  - Followed by ID  - CT scan yesterday      Acute  pulmonary insufficiency     - Intubated 2/12 prior to Avala placement, extubated 2/17   - On 6LNC currently      Anemia     - H/H stable  - Will monitor and transfuse prn      Cancer associated pain     - Methadone at home  - Pain management guided by patient's primary pain physician Dr Irven [place]  - Palliative following  - Currently has dilaudid PCA with cont rate  - PRN ativan, holding scheduled during critical illness      Depression     -Continue Elavil and Lexapro       hx of Pulmonary embolism     - PE 01/18/17 on CT chest   - This hospitalization has been on therapeutic Lovenox, and Heparin gtt  - Heparin SQ TID      Hypothyroidism     -Synthroid        Discussed patient with Dr. Terie Purser    ---Critical Care Bundle---    Fluids & Electrolytes: None    Nutrition / last BM: TPN    Mobility plan: activity as toler    Neuro / sedation / pain / spontaneous awakening: dilaudid PCA, ativan    Respiratory weaning (and PST, TC) / pulmonary toilet: 6LNC    Delirium / sleep / prevention / intervention: n/a    List of antimicrobials: caspo, dapto, ertapenem    Drains / Lines / Tubes: perc x 2, PICC, foley, PIV, PEG    Indication for foley: critical illness    DVT prophylaxis / a PTT: heparin SQ TID    Indications for gastric acid suppression: none    Labs ordered: CBC withh diff, BMP, Mag, Phos daily; Hepatic panel twice weekly; CK, prealbumin, PT/INR weekly    Family concern / updates: family called by floor team    Patient likes to be called: [name]    Code status: FULL    Disposition / Level: ICU    Nursing reiterate plan for day: n/a    Author: Drue Dun, NP  as of: 07/04/2017  at: 2:54 AM     I saw and evaluated the patient. I agree with the provider's findings and plan of care  with the following addendum:    - Suspect new infection but her abdomen does not appear to be the source. Cultures are pending and pneumonia or CLABSI is possible. Will check for an upper extremity DVT given change in in upper extremity  circumference. Will change to Meropenem and give a dose of Vancomycin while we are waiting for ID to re-evaluate her.     Wallis Mart, MD

## 2017-07-04 NOTE — Consults (Signed)
Medical Nutrition Therapy - Follow Up, TPN check    Admit Date: 03/29/2017     Patient Summary: [name] with PMH pulmonary embolism and as noted below, dx pancreatic adenocarcinoma s/p chemoRT (see Oncology notes for full hx), admitted for Whipple completed 03/29/17.  Prolonged admission with post-op course c/b delayed gastric emptying, concern for obstruction distal to the gastro-jejunostomy site, sepsis, biliary obstruction, acute respiratory failure requiring ICU stay from 2/12-2/23/19.  She is s/p multiple drain placements and IR perc aspirations of fluid collections, currently has 2 drains in place for hepatic abscess and drainage of an abdominal collection.  TPN provided 2/5-3/15.  Pt was unable to maintain adequate PO intake, required EN support; she is s/p PEG placement on 4/12.  TFs held and TPN resumed 4/19 due to abdominal pain, concern for bowel injury +/- possible disruption of GJ site.  She is on abx for VRE bacteremia, ID following.   Pt transferred to ICU early AM on 5/2 due to sinus tachycardia, O2 desat requiring venti-mask support.  Currently weaned to 4L O2 via NC.     Past Medical History:   Diagnosis Date    Acute kidney failure 03/31/2017    Cancer     Depression     Fibromyalgia     Hypothyroidism      Past Surgical History:   Procedure Laterality Date    CHOLECYSTECTOMY      CHOLECYSTECTOMY, LAPAROSCOPIC  09/06/2016    HYSTERECTOMY  08/1986    Fibroids    KNEE SURGERY Right     PR INSERT TUNNELED CV CATH WITH PORT Right 10/04/2016    Procedure: Right IJ MEDIPORT Insertion;  Surgeon: Delano Metz, MD;  Location: [facility] MAIN OR;  Service: Oncology General    PR LAP,DIAGNOSTIC ABDOMEN N/A 10/04/2016    Procedure: LAPAROSCOPY DIAGNOSTIC;  Surgeon: Delano Metz, MD;  Location: [facility] MAIN OR;  Service: Oncology General    PR PART Theodosia PANC,PROX+REMV DUOD+ANAST N/A 03/29/2017    Procedure: WHIPPLE PROCEDURE;  Surgeon: Delano Metz, MD;  Location: [facility] MAIN OR;  Service: Oncology General    ROTATOR CUFF  REPAIR Right     TONSILLECTOMY AND ADENOIDECTOMY       Pertinent Social Hx:  Divorced, resides in [place], [place].  Supportive son and daughter.     Pertinent Meds: reviewed, of note:    Scheduled Meds:   insulin lispro  0-20 Units Subcutaneous Q6H    caspofungin  50 mg Intravenous Q24H    levothyroxine IV  69 mcg Intravenous Daily    daptomycin IV  10 mg/kg Intravenous Q24H    ertapenem  1,000 mg Intravenous Q24H    lidocaine  1 patch Transdermal Q24H    lidocaine  1 patch Transdermal Q24H    bisacodyl  10 mg Rectal Daily   Continuous Infusions:   TPN (2 in 1) Adult Continuous      And    fat emulsion soybean oil      TPN (2 in 1) Adult Continuous 55 mL/hr at 07/04/17 0859    HYDROmorphone     PRN Meds:  Naloxone, promethazine, ondansetron     Pertinent Labs: reviewed    BGs range 119-131, insulin coverage and protocol in place    Phos high 4.7, elevated since 4/27; level provided in TPN is lower than standard level, has been reduced over the past 3 days per unit RD recs.    Mg low 1.4, IV repletion given per the MAR   AST,  ALT mildly elevated, alk phos high 532, t bili WNL on 4/29   TG high 190 on 4/29   Vit D was WNL on 06/04/17.  TPN does not provide full DRI of vit D.   carnitine low, 4, on 2/15   Anemic, MCV WNL; anemia panel not recent.  Pt had low iron, slightly elevated ferritin levels on 2/26.      TPN provides R67 and folic acid but not iron.     CRP has been consistently high, 125 on 4/26 most recent; inflammation may be a factor in elevated ferritin levels    Pt has not received any recent RBC transfusions     Vitals: Blood pressure 132/51, pulse (!) 124, temperature (!) 39.4 C (102.9 F), temperature source Axillary, resp. rate (!) 37, height 1.702 m (_0 ), weight 79.8 kg (176 lb), SpO2 94 %.    Reviewed I/O's 5/1   280 ml PO fluid intake    Good UOP   185 ml PEG output   100 ml drain output   Last BM: BM x 1 on 4/30    I/O last 3 days:  04/29 1500 - 05/02 1459  In: 7043.7  (88.2 mL/kg) [P.O.:850; I.V.:502.2; Other:160; NG/GT:60; IV Piggyback:834.7]  Out: 4667 (58.5 mL/kg) [Urine:3550; Emesis/NG output:945; Drains:172]  Net: 2376.7  Weight: 79.8 kg     Access:  PIV, double lumen PICC, PEG placed 4/12    Nutrition Hx:    Pt reported she was primarily only eating cottage cheese with mandarin oranges prior to admission. She would have have this 4x daily and drink Ensure (believes it was Ensure plus) ~TID. She stated she didn't eat much else for weeks. Reports that eating was "awful" after the surgery and had only been able to eat very small amounts.   NPO since 1/31-2/19; TPN started 04/09/17 (10 days after admission), continued to 3/15 with intermittent trials of PO   After stop of TNn on 3/15, pt was on TF with tray, regular diet, Osmolite 1.0; formula change to Osmolite  1.5 on 3/22; continued through 4/14 with only a few interruptions   4/15: NPO   TPN resumed 4/19 to present.  PO clears and dysphagia 1 (pureed) diets trialed 4/27-5/1, NPO resumed 5/2.  Low PO fluid intake per I&Os, 25-50% of meals accepted per RN log while on PO diet. Meals ordered on 5/1 provided 968 kcal, 28 g protein for the day.     Food allergies: NKFA    Current diet: NPO  Supplements: none    Nutrition Focused Physical Exam:  Edema: 1+ general, abdominal, BUE, 2+ BLE per shift assessment   Abdomen: hypoactive BS, incontinent per shift assessment  Skin: L elbow abrasion, skin tear R elbow, bruising, erythema, poor turgor, abdominal skin tear per shift assessment    Anthropometrics:  Height: 170.2 cm (_1 )    Weight: 79.8 kg (176 lb)  Ideal Body Weight: 72.4 kg + 10%  Weight is 101.5 % of IBW  BMI (Calculated): 27.6 kg/(m^2) healthy range for age  Weight Hx: stable x 5 months PTA; severe net loss of 8.1 kg (11.1%) x 2 months between 03/11/17-05/13/17, which appears to be actual loss.  Weight up since 3/11, correlating with increased edema.  06/27/2017 79.8 kg  Actual with 1+ general BUE and 2+ BLE edema     06/18/2017 76.9 kg  Actual with 1+ general, abdominal and 2+ BLE edema   05/28/2017 79.8 kg  Actual with 1+ general, trace BUE, 2+  BLE edema    05/13/2017 64.9 kg  With trace BLE edema, low weight of admission; BMI= 22.4    05/06/2017 65.8 kg  Actual with trace general, BUE, 1+ abdominal, BLE edema    04/17/2017 73.483 kg --with 1+ general, BUE, BLE edema    04/09/2017 73.483 kg Actual   03/29/2017 72 kg Actual admit weight with 1+ general, BLE edema    03/22/2017 71.215 kg --   03/11/2017 73 kg  --No edema per Surgical Oncology clinic note    03/06/2017 72.349 kg --   03/01/2017 73.755 kg --   02/19/2017 72.576 kg Actual   01/29/2017 73.573 kg Actual   01/28/2017 73 kg Actual   12/14/2016 73.256 kg --   11/16/2016 69.899 kg --   10/30/2016 67.586 kg Actual   10/05/2016 71.3 kg Actual     Estimated Nutrient Needs: (Based on low weight of admission, 64.9 kg; close to being a dry weight)    1625-1950 kcal/day (25-30 kcal/kg)   85-104 g protein/day (1.3-1.6 g/kg)    1625-1950 mL fluid/day (25-30 mL/kg) or per team recs     Nutrition Assessment and Diagnosis:   TPN is ordered for tonight as follows:  2-in-1 TPN with lipids via adult central line; amino acid-dextrose solution @ 55 ml/hr x 24 hrs (1320 ml fluid per day), with:   Amino acids 64 g/L  Dextrose 204 g/L   Na+ 106 meq/L   K+ 22 meq/L  Ca++ 4.5 meq/L  Mg 5 meq/L  P 5 mmole/L  Standard MVI  Standard trace elements  No insulin  Chloride: Acetate ratio- max chloride   AND: 54 g/day lipids (270 ml/day 20% soy lipid emulsion, given over 12 hours)  TPN with lipids provides a total of 1590 ml fluid, 1793 kcal, 85 g protein and 269 g dextrose per day.     Pt was not requiring any sliding scale insulin coverage for > one week prior to transfer to ICU, has received only 1 unit so far on 814 per the [facility].  No need to add insulin to TPN.     AST/ALT have been persistently mildly elevated, alk phos persistently high, CRP persistently high.  She may benefit from resuming carnitine in TPN, and  would consider trial of SMOF lipids.  She is at risk for iron and vit D deficiency due to extended period of reliance on TPN during this admission.     Estimate that pt had PO intake of <= 484 kcal and 14 g protein on 5/1, based on meals ordered and intake documented in RN log, meeting < 30% of estimated energy needs and < 20% of estimated protein needs.  Pt with pain r/t PEG site, buttons have not yet fallen off and bumpers can not be adjusted.  Per Palliative Care note, daughter asking if PEG can be removed.  Pt is full code with plan for SNF at discharge.  TPN is a barrier to discharge to SNF.  Poor PO without a PEG while maintaining full code status will also be a barrier to discharge to SNF.  Would try to remove buttons if able and adjust bumpers for comfort, keeping PEG in place.      Malnutrition Status: Pt with evidence of moderate malnutrition per nutrition note of 04/09/17; pt has received PN support since 04/09/17.      Nutrition Intervention:   1. TPN Initiation  The Clinical Nutrition Specialist agrees TPN is appropriate for this patient. Most recently reviewed at [facility]  Rounds on 07/03/17.  Original Start Date: 4/19  D/C Plan:  TPN will be discontinued once gut is functional, TFs or PO can be resumed     2.  For Friday 07/04/17: recommend 2-in-1 TPN with lipids, via adult central line; amino acid dextrose solution @ 55 ml/hr x 24 hrs (1320 ml fluid per day), with: changes in bold   Amino acids 72 g/L  Dextrose 204 g/L   Na+ 106 meq/L   K+ 22 meq/L  Ca++ 4.5 meq/L   Mg 7 meq/L  Phos 3 mmol/L   Standard MVI  Standard trace elements  ADD: 200 mg levocarnitine  Chloride: Acetate ratio- max chloride  AND: 54 g/day lipids (270 ml/day 20% SMOF lipid emulsion, given over 12 hours)  TPN with lipids will provide a total of 1590 ml fluid, 1836 kcal, 95 g protein and 269 g dextrose/day.    Over the weekend- the covering ICU RD can be contacted via the page office (day shifts) if there are any concerns regarding TPN.     3.   When able to resume PO or TFs, recommend starting 2000 international unit vit D daily.  Consult for TF/PO recs as needed.     4.  Recommend checking current iron panel, pt may require iron repletion.  TPN does not provide iron.  Inflammation may skew (elevate) ferritin levels.      5.  Please continue parenteral monitoring panel. Recommended Monitoring Parameters:               PO4, Mg, and basic metabolic panel daily for 3 days or until stable, then 3 times per week. Replete lytes via IV as needed.                Comprehensive metabolic panel and triglycerides weekly. Hold lipids if TG>400.                Check BGs q 6 hours until stable on goal dextrose.                Monitor weight daily until fluid balance is stable, then weekly.               Accurate I&Os.       Nutrition Monitoring/Evaluation:   1. Will monitor TPN tolerance, nutrition-related labs, weight trend, BM pattern.  2. Nutrition to follow up per high nutrition risk protocol.    Serita Butcher, RD, Guadalupe Guerra pager# [phone]

## 2017-07-04 NOTE — Progress Notes (Signed)
Physical Therapy Treatment Note:       07/04/17 0700   PT Tracking   PT TRACKING PT Discontinue   Visit Number   Visit Number Scnetx) / Treatment Day (HH) 0   Additional Comments   Additional comments Pt transferred to ICU level care d/t tachycardia and low O2 saturations. Will discontinue from current PT program. Please re-refer when medically appropriate.     Rodena Piety, PT, DPT  Pager [phone]

## 2017-07-04 NOTE — Interdisciplinary Rounds (Signed)
HPI: [name] w/ PMH fibromyalgia, pancreatic CA s/p Whipple for 'cure' 03/29/17 with unfortunately complicated prolonged course including ICU & intub (extub 2/17), mult IR procedures for drainage of abscesses, VRE bacteremia w/ mediport removal 4/8, s/p R pleural pigtail; s/p PICC after neg bld cx; s/p IR PEG on 4/12 w/ increased pain & FU CT & UGI w/ revealing posterior gastric perforation, s/p IR perc drain x 2 & 4/19 CT revealed oral contrast passing into lesser sac/retroperitoneum.    Foley: yes (07/04/17)  Central Line: PICC (06/13/17)  DVT prophylaxis: SQ heparin/SCDs  Indication for gastric acid suppression: NI  Sedation: NI  Mobility: Up ad lib  Flu Vaccine screen: Complete    07/04/17 Daily Interdisciplinary Goals of Care  Consult ID  Reviewed ABX regimen - Vancomycin x 1   Ertapenem to Meropenem  Unable to obtain sputum culture  Heparin gtt  U/S of PICC arm for DVT

## 2017-07-04 NOTE — Plan of Care (Signed)
Bowel Elimination     Elimination patterns are normal or improving Maintaining        Fluid and Electrolyte Imbalance     Fluid and Electrolyte imbalance Maintaining        GI Bleeding Elimination     Elimination of patterns are normal or improving Maintaining        Mobility     Functional status is maintained or improved - Geriatric Maintaining     Patient's functional status is maintained or improved Maintaining        Nutrition     Patient's nutritional status is maintained or improved Maintaining     Nutritional status is maintained or improved - Geriatric Maintaining        Pain/Comfort     Patient's pain or discomfort is manageable Maintaining     Patient's pain or discomfort is manageable Maintaining        Post-Operative Bowel Elimination     Elimination pattern is normal or improving Maintaining        Post-Operative Hemodynamic Stability     Maintain Hemodynamic Stability Maintaining        Psychosocial     Demonstrates ability to cope with illness Maintaining        Safety     Patient will remain free of falls Maintaining        Renette Butters, RN

## 2017-07-04 NOTE — Significant Event (Signed)
[facility] ON cross cover note:      Called to see patient by bedside RN for tachycardia and low oxygen saturation.     Pt is [name] w/ PMH fibromyalgia, pancreatic CA s/p Whipple for 'cure' 03/29/17 with unfortunately complicated prolonged course including ICU & intub (extub 2/17), mult IR procedures for drainage of abscesses, VRE bacteremia w/ mediport removal 4/8, s/p R pleural pigtail; s/p PICC after neg bld cx; s/p IR PEG on 4/12 w/ increased pain & FU CT & UGI w/ revealing posterior gastric perforation, s/p IR perc drain x 2 & 4/19 CT revealed oral contrast passing into lesser sac/retroperitoneum. GOC discussion w/ Dr Ron Agee, pt's dtr/HCP and Palliative w/ pt agreeing to continue full treatment in hope of returning eventually to independent living. Pt continues on BSA & conservative bowel rest (no meds via PEG), started TPN 4/19 & now off methadone w/ pain controlled w/ Dilaudid PCA.       Pt found laying in bed, awake, alert, obvious distress. HR in 130's, oxygen saturation in high 80's despite supplemental oxygen at 1L NC. Pt given prn dilaudid and ativan and emotional support with minimal change. Pt oxygen increased from 1L NC to 35% 10L with no effect on respirations or oxygen saturation. Labs including VBG, OABG ordered with EKG. Primary team contacted. IVF, CTA chest, heparin drip-neuro protocol ordered per Dr. Cherre Blanc. SICU called to make them aware of patient. Pt unable to verbalize more specific complaints of pain/SOB. Daughter also contacted and made aware of situation.     SICU providers in to see patient-decision made to defer CTA and move patient to ICU [id number]. Will make Dr. Delia Heady team aware.       Shaunak Kreis Arta Bruce, NP

## 2017-07-04 NOTE — Progress Notes (Signed)
Report Given To  Renette Butters, RN      Descriptive Sentence / Reason for Admission   PMHx: fibromyalgia, depression, hypothyroidism, pancreatic CA     HPI: s/p Whipple on 1/25 c/b, mult IR procedures for drainage of abscesses, VRE bacteremia w/ mediport removal 4/8, s/p R pleural pigtail; s/p PICC after neg bld cx; s/p IR PEG on 4/12, and follow up CT & UGI w/ revealing posterior gastric perforation, s/p IR perc drain x2 & 4/19 CT revealed oral contrast passing into lesser sac/retroperitoneum.     5/2:  increased O2 requirements and tachycardia (concerned for PE). Patient found to be febrile to 39.6 with rising WBC, admitted to SICU with concern for sepsis.       Active Issues / Relevant Events   Admitted to [id number] from [facility] @ 0139. Four-eyed skin assessment completed by Riesa Pope, RN & Homero Fellers, RN. All wounds being previously documented on in eMR. Tachycardic to 140s, tachypneic in 40s, t-max 39.6C. Pan cultured (PICC, peripheral sticks, urine). Started on heparin gtt for possible PE, stopped after ~2hrs. MIVF stopped. Having some pain, only using PCA button when encouraged to do so. Very anxious while awake, disoriented. Left arm with PICC measuring 2cm larger around [facility] and 1.5cm larger around upper arm compared to last measurements, team notified. For full assessments, VS and I&Os, see eMR.       To Do List  Flush Perc drains Q8hrs with 27mL NS (0400, 1200, 2000)  Clamp PEG x1hr and unclamp to gravity x4hrs  BG Q6hrs  Need order for Q1hr VS & I&Os      Anticipatory Guidance / Discharge Planning  ---Critical Care Bundle---    Fluids & Electrolytes: None  Nutrition/last BM: TPN & lipids  Mobility Plan/Ability: Ambulate with front wheel walker and 2 person assist  Respiratory weaning (PST. TC)/pulmonary toilet: Weaned to 4L NC   Ventilator settings/oxygen therapy orders correct? No, need orders for NC  Neuro/sedation/pain/sedation vacation: PCA with basal rate and bolus dose  Delirium: Yes/No (based on  [facility] ICU)  Sleep Hygiene: Yes / No if appropriate  List of antimicrobials: Caspo, dapto, ertapenem  Drains/Lines/Tubes: L PICC, PIV x1, PEG, perc x2  Foley/Indication: Yes- accurate I&Os  DVT prophylaxis/aPTT: SQ heparin, unable to wear SCDs d/t skin tears on BLE  Indication for gastric acid suppression: None  Type and frequency of Labs:   Daily: PT-INR, aPTT, Mg, Phos, CBC & diff, BMP  Weekly: Sed rate, CK  Every Monday: Trigycerides, prealbumin  Every Mo, Thurs: HFP  Family Concerns and Updates: None  Patient's preferred name (AKA): [name]  Code Status: FULL  Disposition/Level: ICU  Reiterate Plan for the day  Riesa Pope, RN

## 2017-07-04 NOTE — Progress Notes (Signed)
Respiratory Therapy  Weekly Summary Note    Date: 07/04/2017                         Time: 5:37 AM    Initial Assessment    Subjective   Pt resting comfortably in bed at this time - n/a.    Objective   Patient Vitals for the past 12 hrs:   BP Temp Temp src Pulse Resp SpO2   07/04/17 0512 - - - - - 96 %   07/04/17 0400 128/62 (!) 39.4 C (102.9 F) Axillary (!) 122 (!) 29 97 %   07/04/17 0300 134/58 - - (!) 125 (!) 36 95 %   07/04/17 0229 - - - - - 95 %   07/04/17 0139 148/79 (!) 39.6 C (103.2 F) Axillary (!) 141 (!) 39 96 %   07/04/17 0002 143/74 36.3 C (97.3 F) TEMPORAL (!) 133 19 94 %   07/03/17 2329 - - - - - 90 %   07/03/17 2316 - - - - - (!) 88 %   07/03/17 2307 - - - - - 90 %   07/03/17 2305 142/88 36.4 C (97.5 F) TEMPORAL (!) 127 (!) 30 (!) 85 %   07/03/17 2303 - - TEMPORAL (!) 125 - (!) 83 %   07/03/17 2200 - 36.2 C (97.2 F) TEMPORAL 101 18 92 %   07/03/17 1952 123/61 36.2 C (97.2 F) TEMPORAL 99 16 91 %   07/03/17 1900 - - - - 16 -   07/03/17 1808 97/52 36.1 C (97 F) TEMPORAL 96 16 92 %       Lung Sounds:   Cough present: Weak;Congested  Respiratory Pattern: Tachypneic  Bilateral Breath Sounds: Coarse;Diminished     RESPIRATORY / METABOLIC / BLOOD LEVELS:    Respiratory / Metabolic  pH, ABG: (!) 6.30  pCO2, ABG (mmHG): 35 mmHG  pO2, ABG (mmHG): 87 mmHG  SaO2, ABG (%): 95 %  pH, VBG: (!) 7.43  pCO2, VBG (mmHG): 40 mmHG  pO2, VBG (mmHG): 28 mmHG  SvO2, VBG (%): (!) 41 %  Lactate: (!) 1.7 mmHG    Blood Levels  Hemoglobin: (!) 9.1  Platelets: (!) 572  Albumin: (!) 2.8                        Ventilator Settings:   FiO2: 35 %      Other Relevant Findings:  None    Assessment  Pt is a 72 year old female - pancreatic cancer, s/p whipple - pt with low oxygen levels - placed on Ventimask - ABG was drawn with good results, pt weaned down to 4L NC - sating well - will continue to follow and wean as able.     Plan  Follow MD orders.     Ernestina Penna, RRT 5:37 AM 07/04/2017

## 2017-07-04 NOTE — Progress Notes (Signed)
Report Given To  Rainey Pines, RN.       Descriptive Sentence / Reason for Admission   PMHx: fibromyalgia, depression, hypothyroidism, pancreatic CA   HPI: s/p Whipple on 1/25 c/b, mult IR procedures for drainage of abscesses, VRE bacteremia w/ mediport removal 4/8, s/p R pleural pigtail; s/p PICC after neg bld cx; s/p IR PEG on 4/12, and follow up CT & UGI w/ revealing posterior gastric perforation, s/p IR perc drain x2 & 4/19 CT revealed oral contrast passing into lesser sac/retroperitoneum.   5/2:  increased O2 requirements and tachycardia (concerned for PE). Patient found to be febrile to 39.6 with rising WBC, admitted to SICU with concern for sepsis.       Active Issues / Relevant Events   Assumed care 0700-1900. Tachycardic to 120's and tachypneic in 40's at times, T-max 38.9C. Weaned to RA. PRN tylenol suppository given with positive effect. Using PCA button for pain only when encouraged to do so. Ultrasound of LUE completed at bedside. Heparin gtt started. MRSA amplification culture sent. PIV inserted. PRN Ativan given x1 for anxiety with positive effect. Belongings brought to 814 from [facility]. For full assessment details, see flow sheets and eMAR.         To Do List  Flush Perc drains Q8hrs with 24mL NS (0400, 1200, 2000)  Clamp PEG x1hr and unclamp to gravity x4hrs (clamp at 2200)  BG Q6hrs  Aptt draw Q6 (next draw 0030)          Anticipatory Guidance / Discharge Planning  ---Critical Care Bundle---    Fluids & Electrolytes: None  Nutrition/last BM: TPN & lipids/ BM 5/2  Mobility Plan/Ability: Ambulate with front wheel Ashiah Karpowicz and 2 person assist  Respiratory weaning (PST. TC)/pulmonary toilet: RA   Ventilator settings/oxygen therapy orders correct? Yes  Neuro/sedation/pain/sedation vacation: PCA with basal rate and bolus dose  Delirium: Yes/No (based on [facility] ICU)  Sleep Hygiene: Yes / No if appropriate  List of antimicrobials: Caspo, dapto, vanco, merapenem  Drains/Lines/Tubes: L PICC, PIV x2, PEG, perc  x2  Foley/Indication: Yes- accurate I&Os  DVT prophylaxis/aPTT: heparin gtt  Indication for gastric acid suppression: None  Type and frequency of Labs:   Daily: PT-INR, aPTT, Mg, Phos, CBC & diff, BMP  Weekly: Sed rate, CK  Every Monday: Trigycerides, prealbumin  Every Mo, Thurs: HFP  Family Concerns and Updates: None  Patient's preferred name (AKA): [name]  Code Status: FULL  Disposition/Level: ICU  Reiterate Plan for the day  Renette Butters, RN

## 2017-07-04 NOTE — Plan of Care (Signed)
Impaired Ambulation    • STG - IMPROVE AMBULATION Plan altered due to change in pt condition        Impaired Bed Mobility    • STG - IMPROVE BED MOBILITY Plan altered due to change in pt condition        Impaired Stair Navigation    • LTG - IMPROVE STAIR NAVIGATION Plan altered due to change in pt condition        Impaired Transfers    • STG - IMPROVE TRANSFERS Plan altered due to change in pt condition

## 2017-07-04 NOTE — Progress Notes (Signed)
SW informed that pt has been tx to 814 overnight. S. Thomas paged with update on pts status. Pt is SNF plan  Once medically stable. Family working with Lanice Shirts [facility] on medicaid. Will continue supportive contacts and assist as needed.

## 2017-07-05 ENCOUNTER — Inpatient Hospital Stay: Payer: Medicare (Managed Care)

## 2017-07-05 DIAGNOSIS — K9189 Other postprocedural complications and disorders of digestive system: Secondary | ICD-10-CM

## 2017-07-05 LAB — BASIC METABOLIC PANEL
Anion Gap: 12 (ref 7–16)
CO2: 25 mmol/L (ref 20–28)
Calcium: 8.6 mg/dL (ref 8.6–10.2)
Chloride: 100 mmol/L (ref 96–108)
Creatinine: 0.47 mg/dL — ABNORMAL LOW (ref 0.51–0.95)
GFR,Black: 115 *
GFR,Caucasian: 100 *
Glucose: 122 mg/dL — ABNORMAL HIGH (ref 60–99)
Lab: 20 mg/dL (ref 6–20)
Potassium: 3.2 mmol/L — ABNORMAL LOW (ref 3.3–5.1)
Sodium: 137 mmol/L (ref 133–145)

## 2017-07-05 LAB — HCT AND HGB
Hematocrit: 23 % — ABNORMAL LOW (ref 34–45)
Hemoglobin: 7.1 g/dL — ABNORMAL LOW (ref 11.2–15.7)

## 2017-07-05 LAB — HEPATIC FUNCTION PANEL
ALT: 57 U/L — ABNORMAL HIGH (ref 0–35)
AST: 55 U/L — ABNORMAL HIGH (ref 0–35)
Albumin: 2.4 g/dL — ABNORMAL LOW (ref 3.5–5.2)
Alk Phos: 422 U/L — ABNORMAL HIGH (ref 35–105)
Bilirubin,Direct: <0.2 mg/dL (ref 0.0–0.3)
Bilirubin,Total: <0.2 mg/dL (ref 0.0–1.2)
Total Protein: 6.3 g/dL (ref 6.3–7.7)

## 2017-07-05 LAB — CBC
Hematocrit: 23 % — ABNORMAL LOW (ref 34–45)
Hemoglobin: 7 g/dL — ABNORMAL LOW (ref 11.2–15.7)
MCH: 28 pg/cell (ref 26–32)
MCHC: 31 g/dL — ABNORMAL LOW (ref 32–36)
MCV: 90 fL (ref 79–95)
Platelets: 459 10*3/uL — ABNORMAL HIGH (ref 160–370)
RBC: 2.5 MIL/uL — ABNORMAL LOW (ref 3.9–5.2)
RDW: 17.2 % — ABNORMAL HIGH (ref 11.7–14.4)
WBC: 15.9 10*3/uL — ABNORMAL HIGH (ref 4.0–10.0)

## 2017-07-05 LAB — BODY FLUID CELL COUNT
Nucl Cell,FL: 12 /uL
RBC,FL: 13 /uL

## 2017-07-05 LAB — PROTIME-INR
INR: 1.3 — ABNORMAL HIGH (ref 0.9–1.1)
Protime: 15.1 s — ABNORMAL HIGH (ref 10.0–12.9)

## 2017-07-05 LAB — CBC AND DIFFERENTIAL
Baso # K/uL: 0.1 10*3/uL (ref 0.0–0.1)
Basophil %: 0.6 %
Eos # K/uL: 0.5 10*3/uL — ABNORMAL HIGH (ref 0.0–0.4)
Eosinophil %: 3.2 %
Hematocrit: 22 % — ABNORMAL LOW (ref 34–45)
Hemoglobin: 6.9 g/dL — ABNORMAL LOW (ref 11.2–15.7)
IMM Granulocytes #: 0.1 10*3/uL
IMM Granulocytes: 0.5 %
Lymph # K/uL: 1 10*3/uL — ABNORMAL LOW (ref 1.2–3.7)
Lymphocyte %: 6.1 %
MCH: 28 pg/cell (ref 26–32)
MCHC: 31 g/dL — ABNORMAL LOW (ref 32–36)
MCV: 90 fL (ref 79–95)
Mono # K/uL: 0.8 10*3/uL (ref 0.2–0.9)
Monocyte %: 5.2 %
Neut # K/uL: 13.4 10*3/uL — ABNORMAL HIGH (ref 1.6–6.1)
Nucl RBC # K/uL: 0 10*3/uL (ref 0.0–0.0)
Nucl RBC %: 0 /100 WBC (ref 0.0–0.2)
Platelets: 452 10*3/uL — ABNORMAL HIGH (ref 160–370)
RBC: 2.5 MIL/uL — ABNORMAL LOW (ref 3.9–5.2)
RDW: 17.2 % — ABNORMAL HIGH (ref 11.7–14.4)
Seg Neut %: 84.4 %
WBC: 15.8 10*3/uL — ABNORMAL HIGH (ref 4.0–10.0)

## 2017-07-05 LAB — SEDIMENTATION RATE, AUTOMATED
Sedimentation Rate: 48 mm/hr — ABNORMAL HIGH (ref 0–30)
Sedimentation Rate: 73 mm/hr — ABNORMAL HIGH (ref 0–30)

## 2017-07-05 LAB — CRP: CRP: 154 mg/L — ABNORMAL HIGH (ref 0–10)

## 2017-07-05 LAB — POCT GLUCOSE
Glucose POCT: 108 mg/dL — ABNORMAL HIGH (ref 60–99)
Glucose POCT: 121 mg/dL — ABNORMAL HIGH (ref 60–99)
Glucose POCT: 135 mg/dL — ABNORMAL HIGH (ref 60–99)
Glucose POCT: 135 mg/dL — ABNORMAL HIGH (ref 60–99)

## 2017-07-05 LAB — MAGNESIUM: Magnesium: 1.8 mg/dL (ref 1.6–2.5)

## 2017-07-05 LAB — PHOSPHORUS: Phosphorus: 4.1 mg/dL (ref 2.7–4.5)

## 2017-07-05 LAB — APTT: aPTT: 58.6 s — ABNORMAL HIGH (ref 25.8–37.9)

## 2017-07-05 LAB — MCHC: MCHC: 31 g/dL — ABNORMAL LOW (ref 32–36)

## 2017-07-05 MED ORDER — FAT EMULSION SOYBEAN OIL 20 % IV EMUL *WRAPPED*
54.0000 g | Freq: Every day | INTRAVENOUS | Status: DC
Start: 2017-07-05 — End: 2017-07-05

## 2017-07-05 MED ORDER — STERILE WATER FOR INJECTION (TPN USE ONLY) *I*
INTRAVENOUS | Status: DC
Start: 2017-07-05 — End: 2017-07-05

## 2017-07-05 MED ORDER — MIDAZOLAM HCL 1 MG/ML IJ SOLN *I* WRAPPED
INTRAMUSCULAR | Status: AC
Start: 2017-07-05 — End: 2017-07-05
  Filled 2017-07-05: qty 2

## 2017-07-05 MED ORDER — PLASMA-LYTE IV SOLN *WRAPPED*
500.0000 mL/h | Freq: Once | Status: AC
Start: 2017-07-05 — End: 2017-07-05
  Administered 2017-07-05: 500 mL/h via INTRAVENOUS

## 2017-07-05 MED ORDER — FAT EMULSION SOYBEAN OIL 20 % IV EMUL *WRAPPED*
54.0000 g | Freq: Every day | INTRAVENOUS | Status: AC
Start: 2017-07-05 — End: 2017-07-06
  Administered 2017-07-05: 54 g via INTRAVENOUS
  Filled 2017-07-05: qty 270

## 2017-07-05 MED ORDER — MIDAZOLAM HCL 1 MG/ML IJ SOLN *I* WRAPPED
INTRAMUSCULAR | Status: AC | PRN
Start: 2017-07-05 — End: 2017-07-05
  Administered 2017-07-05: 1 mg via INTRAVENOUS

## 2017-07-05 MED ORDER — POTASSIUM CHLORIDE 10 MEQ/50ML IV SOLN *I*
10.0000 meq | Freq: Once | INTRAVENOUS | Status: AC
Start: 2017-07-05 — End: 2017-07-05
  Administered 2017-07-05: 10 meq via INTRAVENOUS
  Filled 2017-07-05: qty 50

## 2017-07-05 MED ORDER — STERILE WATER FOR INJECTION (TPN USE ONLY) *I*
INTRAVENOUS | Status: AC
Start: 2017-07-05 — End: 2017-07-06
  Filled 2017-07-05: qty 660

## 2017-07-05 MED ORDER — LIDOCAINE HCL 1 % IJ SOLN *I*
INTRAMUSCULAR | Status: AC
Start: 2017-07-05 — End: 2017-07-05
  Filled 2017-07-05: qty 20

## 2017-07-05 MED ORDER — STERILE WATER FOR INJECTION (TPN USE ONLY) *I*
INTRAVENOUS | Status: AC
Start: 2017-07-05 — End: 2017-07-05
  Filled 2017-07-05: qty 563.2

## 2017-07-05 MED ORDER — FENTANYL CITRATE 50 MCG/ML IJ SOLN *WRAPPED*
INTRAMUSCULAR | Status: AC
Start: 2017-07-05 — End: 2017-07-05
  Filled 2017-07-05: qty 2

## 2017-07-05 MED ORDER — HYDROMORPHONE HCL 2 MG/ML IJ SOLN *WRAPPED*
1.0000 mg | Freq: Once | INTRAMUSCULAR | Status: AC
Start: 2017-07-05 — End: 2017-07-05
  Administered 2017-07-05: 1 mg via INTRAVENOUS
  Filled 2017-07-05: qty 1

## 2017-07-05 NOTE — Progress Notes (Signed)
[employer] Nursing Procedure Note    Honesty Menta  [id number]    Procedure: Perc Drain        Status: Completed    Patient tolerated procedure well     Specimen Collection: yes    Sponge count: N/A    Fluid Removed:Yes, color:green and brown, amount: 4 ml  Procedure Dressing Site located:Abdomen  Dressing Type:sterile gauze/tegaderm  Biopatch:no  Dressing status:Clean, dry and intact   Hematoma:Not evident  Medication received:Versed 2mg  and dilaudid 0.5mg  via pt pump  Cardiovascular:   Peripheral Pulses: N/A      Fistula: N/A    Neuro Assessment:Patient is at pre-procedure baseline    Implant patient information given to patient or parent/guardian:N/A    Report given to:Unit Nurse. Unit 814 Name Bedside RN      Last Filed Vitals    07/05/17 1550   BP: 170/83   Pulse: 107   Resp: 22   Temp:    SpO2: 91%

## 2017-07-05 NOTE — Progress Notes (Signed)
[facility] weekend number #[phone] or [phone]

## 2017-07-05 NOTE — Interval H&P Note (Signed)
UPDATES TO PATIENT'S CONDITION on the DAY OF SURGERY/PROCEDURE    I. Updates to Patient's Condition (to be completed by a provider privileged to complete a H&P, following reassessment of the patient by the provider):    Day of Surgery/Procedure Update:  History  (Inpatients only): I confirm that progress notes within the past 24 hours document updates to the patient's condition.    Physical  (Inpatients only): I confirm that progress notes within the past 24 hours document updates to the patient's condition.            II. Procedure Readiness   I have reviewed the patient's H&P and updated condition. By completing and signing this form, I attest that this patient is ready for surgery/procedure.    III. Attestation   I have reviewed the updated information regarding the patient's condition and it is appropriate to proceed with the planned surgery/procedure.    Carmin Richmond, MD as of 3:20 PM 07/05/2017

## 2017-07-05 NOTE — Progress Notes (Addendum)
Surgical Oncology/Hepatobiliary Surgery Progress Note     LOS: 98 days     Subjective:  Interval Events:     No acute events overnight  On 2L NC  CT abdomen yesterday with drainable collection near porta hepatis    Objective:  Vitals Sign Ranges for Past 24 Hours:  BP: (90-135)/(42-73)   Temp:  [36.4 C (97.5 F)-37.4 C (99.4 F)]   Temp src: Axillary (05/03 0800)  Heart Rate:  [81-112]   Resp:  [15-32]   SpO2:  [87 %-99 %]     Physical Exam:  General Appearance: Resting in bed, sleeping comfortably  Cardiac: regular rate  Respiratory: non-labored breathing on room air  Abdomen: Soft, non distended, G tube site c/d/i, tender surrounding tube. Perc site in hepatic abscess with clear bilious drainage. Perc drain in abdominal collection with minimal milky output. PEG to gravity  Extremities: warm, well perfused    Labs:    CBC:    Recent Labs  Lab 07/05/17  0148 07/05/17  0047 07/04/17  1046  07/04/17  0029 07/03/17  0032 07/02/17  0200 07/01/17  0025   WBC 15.9* 15.8*  --   --  16.1* 7.1 7.9 8.6   Hemoglobin 7.0* 6.9*  --   < > 8.7* 7.6* 7.8* 7.9*   Hematocrit 23* 22*  --   --  28* 25* 25* 26*   Platelets 459* 452* 510*  --  572* 523* 515* 507*   < > = values in this interval not displayed.    Metabolic Panel:    Recent Labs  Lab 07/05/17  0047 07/04/17  0029 07/03/17  0032 07/02/17  0200 07/01/17  0025 06/30/17  0100   Sodium 137 136 141 141 136 135   Potassium 3.2* 4.1 3.6 3.9 3.7 3.7   Chloride 100 98 102 102 98 97   CO2 '25 24 25 24 26 25   ' UN 20 18 22* 18 21* 16   Creatinine 0.47* 0.48* 0.49* 0.51 0.49* 0.51   Glucose 122* 119* 120* 121* 120* 123*   Calcium 8.6 9.2 9.1 8.7 9.3 9.0   Magnesium 1.8 1.4* 1.7 1.8 1.6 1.6   Phosphorus 4.1 4.7* 4.7* 4.8* 4.8* 4.9*          Assessment:  72 y.o. female with h/o recent PE and pancreatic cancer POD # 98  status post whipple procedure complicated by delayed gastric emptying.  NGT replaced on 04/04/17. UGI on 04/12/17 concerning for obstruction just beyond the [place] in the  efferent limb.  Course complicated on 9/62/95 by rising LFTs and sepsis with CT concerning for cholangitis given biliary tree obstruction and PV compression due to hematoma and afferent limb distention in setting of PE treatment. Is s/p IR PTC on 04/16/17 and IR percutaneous aspiration of anterior abdominal fluid collection on 04/24/17. IR LUQ perc drain placement 3/1. IR anterior abdominal 5f perc drain placement 3/3. IR anterior perc drain into left hepatic collection 3/6.     Plan:  - Keep NPO for now. Cont TPN for nutritional support. Keep G-tube to gravity  - Plan for IR perc drain of abdominal fluid collection  - Appreciate palliative care recommendations. Multimodal pain management  - Continue IV antibiotic regimen per ID Recommendations.   - Monitor perc drain outputs.   - hx of PE. Continue heparin gtt. ?PE as etiology of decompensation although appears more like sepsis at present  - Dispo: Pending clinical course. Will eventually require SNF  Cherylann Banas, MD  07/05/2017     9:30 AM   General Surgery Resident      HPB and GI Surgery Attending for Dr. Ron Agee:    I saw and evaluated the patient. I have reviewed and edited the resident's/fellow's note and confirm the findings and plan of care as documented above. Depressed. Complaint is length of hospitalization and pain at G tube. Sepsis resolving with abx. IR drainage planned.    May benefit from Zoloft  Support provided    Charolotte Eke, MD

## 2017-07-05 NOTE — Progress Notes (Signed)
Report Given To  Chales Abrahams, RN     Descriptive Sentence / Reason for Admission   PMHx: fibromyalgia, depression, hypothyroidism, pancreatic CA   HPI: s/p Whipple on 1/25 c/b, mult IR procedures for drainage of abscesses, VRE bacteremia w/ mediport removal 4/8, s/p R pleural pigtail; s/p PICC after neg bld cx; s/p IR PEG on 4/12, and follow up CT & UGI w/ revealing posterior gastric perforation, s/p IR perc drain x2 & 4/19 CT revealed oral contrast passing into lesser sac/retroperitoneum.   5/2:  increased O2 requirements and tachycardia (concerned for PE). Patient found to be febrile to 39.6 with rising WBC, admitted to SICU with concern for sepsis.     Active Issues / Relevant Events   Assumed care of pt 1900-0700. A&Ox2, remains disoriented to time. Tachypneic at times to 20's-30's with anxiety but resolves on own, PRN Ativan given x1 w/ positive effect. Desat to 87%, placed back on [facility] and maintaining sats >92%, SICU resident Ernest Mallick made aware. Blood tinged output noted from L perc drain, SICU made aware and at bedside to evaluate. HCT 22 (28), SICU aware, re-draw 23 (22), SICU aware and Heparin gtt stopped overnight per order. R Perc drain noted to be leaking around site, A. McGuire made aware, no new orders at this time. Plan for IR today. Will continue to monitor. For full assessment and VS please see doc flow.       To Do List  Flush Perc drains Q8hrs with 44mL NS (0400, 1200, 2000)  Clamp PEG x1hr and unclamp to gravity x4hrs (next clamp at 0800-0900)  BG Q6hrs  Heparin gtt on hold     Anticipatory Guidance / Discharge Planning  ---Critical Care Bundle---    Fluids & Electrolytes: None  Nutrition/last BM: TPN & lipids/ BM 5/2  Mobility Plan/Ability: Ambulate with front wheel walker and 2 person assist  Respiratory weaning (PST. TC)/pulmonary toilet: [facility]  Ventilator settings/oxygen therapy orders correct? Yes  Neuro/sedation/pain/sedation vacation: PCA with basal rate and bolus dose  Delirium: Yes/No  (based on [facility] ICU)  Sleep Hygiene: Yes / No if appropriate  List of antimicrobials: Caspo, dapto, vanco, merapenem  Drains/Lines/Tubes: L PICC, PIV x2, PEG, perc x2  Foley/Indication: Yes- accurate I&Os  DVT prophylaxis/aPTT: heparin gtt -- on hold overnight  Indication for gastric acid suppression: None  Type and frequency of Labs:   Daily: PT-INR, aPTT, Mg, Phos, CBC & diff, BMP  Weekly: Sed rate, CK  Every Monday: Trigycerides, prealbumin  Every Mo, Thurs: HFP  Family Concerns and Updates: None  Patient's preferred name (AKA): Amanda Galloway  Code Status: FULL  Disposition/Level: ICU  Reiterate Plan for the day    Cecelia Byars, RN

## 2017-07-05 NOTE — Progress Notes (Signed)
Palliative Care Progress Note    HPI: [name] w/ PMH fibromyalgia, pancreatic CA s/p Whipple for 'cure' 03/29/17 with unfortunately complicated prolonged course including ICU & intub (extub 2/17), mult IR procedures for drainage of abscesses, VRE bacteremia w/ mediport removal 4/8, s/p R pleural pigtail; s/p PICC after neg bld cx; s/p IR PEG on 4/12 w/ increased pain & FU CT & UGI w/ revealing posterior gastric perforation, s/p IR perc drain x 2 & 4/19 CT revealed oral contrast passing into lesser sac/retroperitoneum. GOC discussion w/ Dr Ron Agee, pt's dtr/HCP and Palliative w/ pt agreeing to continue full treatment in hope of returning eventually to independent living. Pt continues on BSA & conservative bowel rest (no meds via PEG), started TPN 4/19 & now off methadone w/ pain controlled w/ Dilaudid PCA. Pt's most recent CT abd 4/26 showed resolution of gastric perforation, pt advancing her PO diet from clears. Pt transferred to ICU on 5/2 w/ fever and hypoxia, improved w/ Vanco & abx changes, now s/p IR 5/3 for hepatic abscess drain. Palliative is following for ongoing symptom management and support.    Subjective: "I can't keep doing this. I screamed a few times (in IR). I need my pain medicine"    Objective: chart reviewed, visited w/ dtr [name] in pt room on 814 ~ 20 min before pt returned from IR this afternoon; pt was transferred to ICU early AM hours on 5/2 w/ fever and hypoxia 81% & new O2 requirement 4 L NC; ID rec'd drain of hepatic abscess and abx changes; pt clinically improved w/ NC to 2 L, she was quite anxious and tearful upon return from ICU this afternoon & stating she couldn't keep doing this; dtr [name] reassuring pt, Vetta did seem to calm after hitting PCA button, RN aware of ativan prn available for anxiety    Palliative Care ROS:   Pain   Moderate  Nausea   None  Anorexia   Unknown  Anxiety   Severe  Depression   Severe  Shortness of Breath   Mild  Tiredness/Fatigue    Moderate  Drowsiness/Sleepiness   Moderate  Airway Secretions   no  Constipation   no  Unable to Respond   no  Delirium   no  Last stool 5/2    Physical Examination:   BP: (90-170)/(44-85)   Temp:  [36.4 C (97.5 F)-36.8 C (98.2 F)]   Temp src: Axillary (05/03 1200)  Heart Rate:  [85-107]   Resp:  [17-45]   SpO2:  [87 %-98 %]   Gen appearance: [name], laying in bed, pt reports abd pain & pain all over, very anxious after returning from IR s/p drain placement, offered emotional support   Lungs: easy resp, no cough, RA  Heart: reg, tele  Abd: large, distended and tender to touch, +BS, PEG w/ gravity drainage, R side abd perc drain (scant pale green drainage) & new R side perc drain w/ red serosang drainage; L side abd perc drain (scant milky white drainage); voids on commode  Extrem: edematous LEs  Skin: warm, thin and fragile pink flaky dry skin on LEs, L arm PICC  Neuro: A+O x 3, sleepy & increased anxiety after return from IR this afternoon; offered encouragement and emotional support    Assessment/Plan: [name] w/ pancreatic CA s/p Whipple in Jan w/ prolonged complicated hosp course, 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 Abd  abscesses w/ drains in place, modified bowel rest w/ TPN and Dilaudid PCA for pain management as pt's methadone now weaned out of system. Unfortunately pt w/ recent fever and hypoxia requiring ICU, now s/p IR 5/3 for new R hepatic abscess drain.    Pain/dyspnea  S/p methadone (last IV dose was 4/21, now should be weaned out of system)  Dilaudid PCA 0.5 mg/hr + 0.6 mg IV Q 15 min prn (received total 37.5 mg from 5/2 6 am to 5/3 6 am, equiv to 760 mg po ME w/ 15:1 ratio to methadone equiv to ~ 50 mg/d) noted decrease 12% use from previous day when used total 42.5 mg from 5/1 6 am to 5/2 6 am   Dilaudid 1 mg IV Q 2 hrs prn available for RN to give above PCA if warranted (x 1 on 5/3)   Lidocaine patch  daily x 2 (abd, LUE)  Acetaminophen 650 mg pr Q 4 hrs prn fever (x 1 on 5/2)    Anxiety/agitation/nausea/fatigue  S/p duloxetine (last dose 4/15), stopped for bowel rest   Levothyroxine 69 mcg IV daily (started IV 4/23, last TSH checked 12/14/16 was 2.87)  Ativan 0.25 mg IV Q 12 hrs ATC (started 4/30)  Ativan 0.25 mg IV Q 6 hrs prn (x 1 on 5/2, x 3 on 5/3)  Ondansetron 4 mg IV Q 6 hrs prn (x 1 on 5/1)  Promethazine 12.5 mg IV Q 6 hrs prn (not used, caution for sedation)  Encouraged complementary adjuvant therapies for coping, meditation app, aromatherapy, gentle massage     Prevention of opioid induced constipation, last stool 5/2  Bisacodyl supp pr daily     Dry flaky edematous Skin  Aquaphor prn   Eucerin prn for BLEs    GOC/prognosis  Full code  Dtr Rod Mae is HCP (very supportive and informed, former [facility] RN)  Goal currently is to prioritize pt's symptom control, long course though pt endorses agreement w/ continuing current treatment plan in hopes of eventually making recovery back to independent  living     Pt case discussed w/ bedside RN & extended time w/ dtr [name] offering support.     We will continue to follow along. I've asked my PC attending Dr Irven Baltimore to see pt over Ali Chuk. Please call my PC colleagues if questions/concerns.    Total Time Spent 45 minutes:   >50% of time was spent in counseling and/or coordination of care.     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    Anemia D64.9    Hypothyroidism E03.9    Pancreatic cancer C25.9    Anemia D64.9     hx of Pulmonary embolism I26.99    Acute pulmonary insufficiency J98.4    Depression F32.9    Sepsis A41.9       No Known Allergies (drug, envir, food or latex)    Scheduled  Meds:    meropenem  1,000 mg Intravenous Q8H    linezolid  600 mg Intravenous Q12H    insulin lispro  0-20 Units Subcutaneous Q6H    caspofungin  50 mg Intravenous Q24H    levothyroxine IV  69 mcg Intravenous Daily    lidocaine  1 patch Transdermal Q24H    lidocaine  1 patch Transdermal Q24H    bisacodyl  10 mg Rectal Daily       Continuous Infusions:    TPN (2 in 1) Adult Continuous      And    fat emulsion soybean oil      TPN (2 in 1) Adult Continuous      heparin Stopped (07/05/17 0230)    HYDROmorphone         PRN Meds:  midazolam, DVT-PE Adult Heparin Protocol **AND** [COMPLETED] heparin **AND** heparin **AND** heparin **AND** APTT **AND** Platelet count- Heparin **AND** [CANCELED] Platelet count- Heparin **AND** [START ON 07/06/2017] Platelet count- Heparin **AND** Baseline and consecutive platelet counts are being monitored for heparin induced thrombocytopenia while on IV unfractioned heparin per protocol, phenazopyridine, acetaminophen, LORazepam, HYDROmorphone PF, Nursing communication- Give 4 OZ of fruit juice for BG < 70 mg/dl **AND** dextrose **AND** dextrose **AND** glucagon **AND** POCT glucose, naloxone, mineral oil-hydrophilic petrolatum, promethazine, sodium chloride, sodium chloride, sodium chloride, dextrose, ondansetron

## 2017-07-05 NOTE — Procedures (Signed)
Procedure Report           Time out documentation completed prior to procedure:  Yes    Indications/Pre-Procedure diagnosis:  Hepatic abscess    Procedure performed: IR percutaneous drainage    Guide Wire Removed:Yes    Findings/Procedure Summary (detailed report located in the Image tab): Placement of an 8 French drain into right intra-hepatic abscess.  Contrast injection demonstrates communication with the adjacent bile ducts (infected biloma).  Ordering provider was notified.     Complications:  None    Condition:  Stable    EBL: Minimal    Specimens:  yes    Operators: Dr. Carmin Richmond    Disposition:  Return to floor    Post-Procedure Diagnosis: Unchanged    Carmin Richmond, MD  07/05/2017  4:03 PM

## 2017-07-05 NOTE — Progress Notes (Addendum)
Infectious Diseases (Team 3) Follow Up Note    Personally reviewed chart, labs, medications. Patient seen.     CC:  F/U VRE bacteremia, Liver and abdominal abscesses, s/p Enterobacter cloacae bacteremia, Intra-abdominal abscesses positive for VRE, enterobacter and candida glabrata       Interval History: 07/04/17 To SICU d/t sepsis unk source with ? O2 requirements, tachy, febrile (39.4), CBC ?16.1,  Broadened to Linezolid, meropenem, caspofungin.     Subjective:  Denies N/V/D/F/C.  WOB unlabored on 2L nc, O2 sats 97-98%, denies cough.  Continues to endorse abdominal pain mostly around PEG. Patient is tearful.  Daughter at bedside    ROS: Reviewed 6 systems in detail, see above    Current Meds:  Scheduled Meds:   meropenem  1,000 mg Intravenous Q8H    linezolid  600 mg Intravenous Q12H    insulin lispro  0-20 Units Subcutaneous Q6H    caspofungin  50 mg Intravenous Q24H    levothyroxine IV  69 mcg Intravenous Daily    lidocaine  1 patch Transdermal Q24H    lidocaine  1 patch Transdermal Q24H    bisacodyl  10 mg Rectal Daily     Continuous Infusions:   TPN (2 in 1) Adult Continuous      And    fat emulsion soybean oil      TPN (2 in 1) Adult Continuous      heparin Stopped (07/05/17 0230)    HYDROmorphone       PRN Meds:.   calcium carbonate  1,000 mg Oral BID PRN    HYDROmorphone PF  0.5 mg Intravenous Q1H PRN    Or    HYDROmorphone PF  1 mg Intravenous Q1H PRN    LORazepam  0.5 mg Oral Q6H PRN    heparin lock flush  50 Units Intracatheter PRN    ondansetron  4 mg Intravenous Q6H PRN     Objective:  BP: (90-144)/(44-85)   Temp:  [36.4 C (97.5 F)-37 C (98.6 F)]   Temp src: Axillary (05/03 1200)  Heart Rate:  [85-102]   Resp:  [17-45]   SpO2:  [87 %-99 %]     General Appearance: in bed, appears uncomfortable and anxious, tearful.   HEENT: Sclera anicteric, MMM  Pulm: diminished throughout, poor inspiratory effort, no wheezes or rhonchi, high pitched left base  CV: RRR, S1S2, no M/R/G, LE edema  bilaterally  Abdomen:  Distended, soft lower quads, semi-firm upper quads, diffusely TTP particularly at PEG tube (erythema at PEG site) , active bowel sounds,  Abdominal drains present: Right drain (bilious drainage to collection bag) and left drain (milky greenish-brown to drainage collection bag).   Extremities:WWP  Skin:Warm and dry  Neuro: alert and oriented to place and person, confused to time  Lines/Drains/Tubes: 4/9 PIV R wrist, 07/04/17 PIV, PICC LUE: all sites benign (4/11), PEG (4/12), Left abdominal drain, Right hepatic drain (both from 4/17), Foley Cath (5/2)      Recent Labs  Lab 07/05/17  0148 07/05/17  0047 07/04/17  1046 07/04/17  0043  07/04/17  0029 07/03/17  0032   WBC 15.9* 15.8*  --   --   --  16.1* 7.1   Hemoglobin 7.0* 6.9*  --  9.1*  < > 8.7* 7.6*   Hematocrit 23* 22*  --   --   --  28* 25*   Platelets 459* 452* 510*  --   --  572* 523*   Seg Neut %  --  84.4  --   --   --  85.5 64.1   Lymphocyte %  --  6.1  --   --   --  7.0 17.3   Monocyte %  --  5.2  --   --   --  3.9 9.7   Eosinophil %  --  3.2  --   --   --  2.3 6.9   < > = values in this interval not displayed.    Recent Labs  Lab 07/05/17  0047 07/04/17  0029 07/03/17  0032   Sodium 137 136 141   Potassium 3.2* 4.1 3.6   CO2 _0 UN 20 18 22*   Creatinine 0.47* 0.48* 0.49*   Glucose 122* 119* 120*   Calcium 8.6 9.2 9.1         Lab results: 07/01/17  0025 06/27/17  0414 06/24/17  0021 06/21/17  2356 06/20/17  0000   Total Protein 6.6 5.5* 5.7* 5.4* 5.7*   Albumin 2.8* 2.0* 2.2* 2.0* 2.1*   ALT 59* 33 69* 91* 45*   AST 70* 29 87* 144* 65*   Alk Phos 532* 437* 540* 594* 496*   Bilirubin,Total <0.2 <0.2 <0.2 <0.2 <0.2       Lab Results  Component Value Date/Time   CRP 154 (H) 07/05/2017 0913   CRP 125 (H) 06/28/2017 0020   CRP 282 (H) 06/18/2017 0102       Lab Results  Component Value Date/Time   Sedimentation Rate 73 (H) 07/05/2017 0913   Sedimentation Rate 48 (H) 07/05/2017 0047   Sedimentation Rate 63 (H) 06/28/2017 0020       Lab  Results  Component Value Date/Time   CK 48 07/03/2017 0032   CK 48 06/26/2017 0013   CK 29 06/19/2017 0020       Micro:   1/25: Intraoperative bile: GS 0 pmns, no organisms, Cx: no growth  2/12: 1 set of blood cultures from the right Mediport (no peripheral bld was sent): positive for Enterobacter cloacae complex in 70 hrs, sensitive to zosyn.   2/18 abdominal GS had >25 PMNs, many GPC in pairs/chains, many GNB and cultures 4+ Enterobacter cloacae complex. The cultures also grew 1+ Candida glabrata   2/21: BCx from the Right peripheral IV, Mediport, L IJ:  No growth  05/03/17: IR-guided I&D with drain placement of the left upper quadrant collection: GS > 25 PMNs, many GNB and GN diplococci, Cultures with 4+ Enterobacter cloacae complex (resistant to Zosyn and CTX), C. Albicans and C. Glabrata, and Candida tropicalis  05/04/17:    1 set of blood cultures from left IJ: VRE at 15.3 TTP. Daptomycin sensitivities: MIC 4mg/ml (dose dependent),  Linezolid   1 set from periphery: VRE and Enterobacter cloacae at 14.8 TTP   05/05/17:  IR-guided I&D with drain placement of the intrahepatic collection: GS 1-10 PMNs, very few GPC in pairs. Fungal cultures with Candida glabrata  05/05/17: Blood cultures from periphery, Mediport and IJ: no growth  05/04/17: Urine cultures obtained (for unclear reason): no growth  05/08/17: Abscess GS: >25 PMNs, many GPC in chains, few GNB. Aerobic cultures growing 2+ Enterobacter (ertapenem, Cipro, TMP/SMX-sensitive) and 3+VRE, Fungal: C. Glabrata (sens to caspo, vori and dose dependent sens to fluconazole)  06/06/17: 2 sets of BCx (1 set each from periphery and Mediport) - taken after restarting ertapenem: IVAD grew VRE ttp 18.2 hours,  Periphery (Left arm) grew VRE ttp 17.3.  06/07/17 Pleural fluid (left)  GS 1-10 PMN's, 1-10 Nucleated white cells, No organisms seen and 1604 nucleated cells. No growth on culture.   06/07/17 Abdominal abscess: labs cancelled - no specimens received.   06/08/17: Mediport BC + for VRE  in 23 hrs (R-Amp, R- tetracycline per lab, suppressed result, R- doxycycline;  S-Linezolid, S-Dapto dose dependent),  Right hand peripheral cx: No growth   06/10/17: Blood culture 2/2: 1 periphery (RHand), 1 from Mediport before removed: no growth  06/11/17: Blood Culture 2/2 (both periphery) - no growth     06/19/17:  Perigastric fluid #1:  GS 10-25 PMNs -  GPB, GNB, GPC in pairs. 1+ C. Glabrata (R- fluconazole, voriconazole, S- Caspofungin); 2+ Enterococcus faecium (R- amp, PenG, Vanco; S- Linezolid)  06/19/17:  Liver fluid collection:  GS: 0 PMNs, Fungal and AFB stains negative, 1+ enterococcus faecium (R- Amp, PenG, Vanco, S- Dapto dose dependent, Linezolid)  06/19/17 : Perigastric fluid #2: GS: >25 PMNs, Gram negative bacilli; 3+ C. Glabrata.     07/04/17 U/A negative  07/04/17 Blood Cultures 3/3 (1 set PICC proximal port, 2 sets periphery): NGTD  07/04/17 Sputum: To be collected  07/04/17 MRSA amplification Nares: negative    Imaging/Other Relevant Diagnostics:     07/04/17 Portable US Doppler LUE: No evidence of venous thrombosis in the visualized deep veins left upper extremity. There is limited evaluation of the lower portion of the left brachial vein due to overlying bandages    07/04/17 CXR: Moderate bilateral pleural effusions with bibasilar opacities which may represent atelectasis or pneumonia. Superimposed pulmonary edema.    07/03/17 Abdominal/Pelvis contrasted CT:   Patient is post Whipple procedure with CT findings of multiple intra-abdominal/pelvic fluid collections. Unchanged pigtail catheter in the right posterior liver lobe for drainage of prior liver abscess/biloma with interval resolution.   Persistent fluid collection inferior from the area of the pigtail catheter right liver lobe, as above.  Persistent left upper quadrant surgical drain with the pigtail catheter tip in the prior focus of the previously multiloculated fluid collection. The inferior aspect of this collection is resolved. Persistent rim-enhancing  fluid collection at the posterior aspect of the stomach which demonstrates interval decrease in size compared to the CT scan from 06/17/2017.  Interval decrease in the multiloculated left upper quadrant fluid collection along superior pole of the spleen extending from the anterior left subphrenic region along the diaphragm to the splenic flexure of the colon.  No CT evidence of extravasation of oral contrast concerning for gastric perforation.  Interval resolution of the previously visualized 2 fluid pelvic collections.  Bilateral small pleural effusions with associated compressive atelectasis, unchanged.       Assessment and Plan:  ID problem(s):   - s/p enterobacter cloacae bacteremia  - Multiple and extensive Intra-abdominal abscesses growing VRE, enterobacter and candida glabrata  - VRE Bacteremia     72 y.o.femalewith h/o recent PE and pancreatic cancer, s/p whipple procedure (goal of cure), with a prolonged and complicated hospital course including delayed gastric emptying, elevated LFTs and sepsis, mild malnutrition 2/2 early satiety and anorexia (requiring PEG), and multiple intraabdominal and hepatic abscesses.  Amanda Galloway was transferred to SICU on evening of 5/1 d/t respiratory decompensation, new onset leukocytosis and fever Tmax 103.2 which occurred while she was on Daptomycin (33m/kg), Ertapenem 1 gram, and Caspofungin.  She was given Vancomycin and Ertapenem was changed to meropenum by ICU given patients change in status. Caspofungin was continued.  Team requested broadened antibiotic coverage. The change in patient status suggestive of active infection,  however pneumonia remains low on differential given absence of respiratory symptoms and O2 demands quickly resolving. She has no cough and has not been able to provide a sputum for culture.   It is not clear if sepsis 2/2 intraabdominal source. All but one of intraabdominal/hepatic fluid collections have improved. We have recommended drainage and  cultures of remaining persistent fluid collection inferior to the current pigtail in right liver lobe and agreed to broadening antibiotics until all cultures result, currently patient is on IV linezolid, meropenem, and caspofungin.      Temesha is HDS (no pressors were required), afebrile, WOB unlabored on 2L nc with excellent O2 sats and no respiratory symptoms. She continues to endorse significant abdominal pain, particularly around PEG. Two abdominal drains remain in, the left with sm amount milky greenish drainage, the right with sm amount clear bilious.  She was on her way down to IR for drainage of remaining liver fluid collection if amendable, cultures requested of same.     Will continue to follow pending cultures, if all are negative, advise to return to original antibiotic regimen (ie, Daptomycin 96m/kg, Ertapenem 1 gram, Caspofungin 50 mg)    Recommendations:   Drain persistent liver fluid collection, obtain cultures   Continue Linezolid 600 mg IV every 12 hours (watching carefully for serotonin syndrome given recent methadone, d/c'd on 4/23)    Continue meropenem 1 gram IV every 8 hours   Continue caspofungin 50 mg IV daily   Follow daily CBC with diff and BMP   Follow weekly CRP, and ESR.    ID team 3 will continue to follow along    Thank you for allowing [facility] participate in the care of this patient  Please call with questions/concerns.     VLonzo Candy ANP-BC  Infectious Diseases, Team 3  Pager [phone] Office Ph [phone]   ID Attending Addendum: I personally saw examined the patient and discussed the case in detail with Ms. Davis. The note was reviewed and I agree with the details of the physical exam findings and record of pertinent laboratory and diagnostic data. I concur with the assessment and recommendations which reflect our discussion. Clinically more stable today. Source of decompensation remains unclear but the one remaining liver abscess which was not drained will be drained  today and achieve good source control for the intraabdominal collections. Would therefore continue linezolid, meropenem and caspo through the weekend and if respiratory, blood and urine cultures are sterile which we suspect they will be, transition back to dapto, ertapenem and caspo on Monday.      AZenovia Jarred MD  ID Attending, pager [phone] Jul 05, 2017 5:11 PM

## 2017-07-05 NOTE — Progress Notes (Signed)
Critical Care Attending Physician note    Returned to the SICU yesterday.  Overnight, febrile overnight with associated tachycardia, and desaturation event.  This morning, more anxious, tearful.  hgb down slightly so heparin drip was held.      Blood pressure 115/65, pulse 91, temperature 36.6 C (97.9 F), temperature source Axillary, resp. rate (!) 29, height 1.702 m (5\' 7" ), weight 79.8 kg (176 lb), SpO2 96 %.      General: tearful   Neck: no jugular venous distention, no adenopathy  Chest: diminished at the bases  Car: rrr  Abd: abd soft, ttp particularly around the drain sites.  Peg in place  Ext: There is no clubbing.  There is no cyanosis.  There is trace edema.  Neuro: anxious, tearful      SYNOPSIS OF OUR ASSESSMENT AND PLANS:    1. GI/ID  #s/p whipple on 1/25 with Dr Ron Agee  #multiple intraabdominal fluid infections  #sepsis  - continue tpn, g-tube to gravity  - IR drain today for repositioning of perihepatic fluid collection; heparin drip held  - change from dapto to linezolid (monitor for signs of serotonin syndrome); continue meropenem and caspofungin  - ID following and their input is appreciated    2. Pulmonary  #concern for pneumonia  #history of pulmonary embolism  - change from dapto to linezolid as above for better pulmonary coverage  - hold heparin for now pending IR    3. Heme  #anemia  #history of VTE  - hold heparin for now given hemoglobin drip.    - repeat cbc after IR  - SCDs for dvt ppx    4. Neuro/pain  #cancer-related pain  #anxiety  #delirium  - continue dilaudid pca; palliative following  - holding home methadone        ______________________________________________________________________  This patient was evaluated on rounds with the resident physician, Dr. Hale Drone.  I personally examined the patient.  All nursing documentation, laboratory data, test results, and radiographs were reviewed and interpreted by me.  I have established the management plan for this patient's critical illness  and have been immediately available to assist with patient care.  I agree with the database, findings, and plan of care recorded in the resident physician note.    This patient is critically ill with at least 1 organ system failure associated with a high probability of imminent or life threatening deterioration. The care I have delivered involved high complexity decision making to assess, manipulate, and support vital system function(s), to treat the vital organ system failure and/or prevent further life threatening deterioration of the patient's condition.     Critical care time: I spent 36 minutes personally attending to this patient's critical care needs. This critical care time is exclusive of any time for separately billable procedures    Earnest Bailey, MD, MSHP

## 2017-07-05 NOTE — Plan of Care (Signed)
Bowel Elimination     Elimination patterns are normal or improving Progressing towards goal        Fluid and Electrolyte Imbalance     Fluid and Electrolyte imbalance Progressing towards goal        GI Bleeding Elimination     Elimination of patterns are normal or improving Progressing towards goal        Mobility     Functional status is maintained or improved - Geriatric Progressing towards goal     Patient's functional status is maintained or improved Progressing towards goal        Nutrition     Patient's nutritional status is maintained or improved Progressing towards goal     Nutritional status is maintained or improved - Geriatric Progressing towards goal        Pain/Comfort     Patient's pain or discomfort is manageable Progressing towards goal     Patient's pain or discomfort is manageable Progressing towards goal        Post-Operative Bowel Elimination     Elimination pattern is normal or improving Progressing towards goal        Post-Operative Hemodynamic Stability     Maintain Hemodynamic Stability Progressing towards goal        Psychosocial     Demonstrates ability to cope with illness Progressing towards goal        Safety     Patient will remain free of falls Progressing towards goal

## 2017-07-05 NOTE — Consults (Signed)
Medical Nutrition Therapy - Follow Up, TPN Check    Admit Date: 03/29/2017    Patient Summary:  [name] with PMH pulmonary embolism and as noted below, dx pancreatic adenocarcinoma s/p chemoRT (see Oncology notes for full hx), admitted for Whipple completed 03/29/17.  Prolonged admission with post-op course c/b delayed gastric emptying, concern for obstruction distal to the gastro-jejunostomy site, sepsis, biliary obstruction, acute respiratory failure requiring ICU stay from 2/12-2/23/19.  She is s/pmultiple drain placements and IR perc aspirations of fluid collections, currently has 2 drains in place for hepatic abscess and drainage of an abdominal collection.  TPN provided 2/5-3/15.  Pt was unable to maintain adequate PO intake, required EN support; she is s/p PEG placement on 4/12.  TFs held and TPN resumed 4/19 due to abdominal pain, concern for bowel injury +/- possible disruption of GJ site.  She is on abx for VRE bacteremia, ID following.   Pt transferred to ICU early AM on 5/2 due to sinus tachycardia, O2 desat requiring venti-mask support. Concern for sepsis.  Currently weaned to 4L O2 via NC. Plan for IR perc drain of abdominal fluid collection.    Pertinent Meds: reviewed   Scheduled Meds:   meropenem  1,000 mg Intravenous Q8H    linezolid  600 mg Intravenous Q12H    insulin lispro  0-20 Units Subcutaneous Q6H    bisacodyl  10 mg Rectal Daily     Continuous Infusions:   TPN (2 in 1) Adult Continuous      TPN (2 in 1) Adult Continuous      And    fat emulsion soybean oil      HYDROmorphone       PRN Meds: naloxone, promethazine, ondansetron    Pertinent Labs: reviewed   BGs range 117-153, insulin coverage and protocol in place   K+ low 3.2 - repletion x 1 on 5/3   Creatinine low 0.47   AST, ALT mildly elevated, Alk Phos high 532, t bili WNL on 4/29    TG high on 4/29   Vit D WNL on 4/29   Anemic, MCV WNL; anemia panel not recent.  Pt had low iron, slightly elevated ferritin levels on 2/26.    TPN  provides W26 and folic acid but not iron.     CRP has been consistently high, 125 on 4/26 most recent; inflammation may be a factor in elevated ferritin levels    Pt has not received any recent RBC transfusions     Reviewed I/O's   No noted PO intake on 5/2    1360 UOP - good   280 mL PEG output on 5/2   90 mL drain output on 5/2   BM x 2 on 5/2   Net: +1730 output    I/O last 3 days:  04/30 1500 - 05/03 1459  In: 7351.7 (92.1 mL/kg) [P.O.:580; I.V.:814.3; Other:160; NG/GT:240; IV Piggyback:1361.7]  Out: 5390 (67.5 mL/kg) [Urine:4365; Emesis/NG output:835; Drains:190]  Net: 1961.7  Weight: 79.8 kg     Enteral or parenteral access: PEG, double lumen PICC    Food allergies: NKFA    Current diet: TPN  Supplements: none    Nutrition Focused Physical Exam:    Edema: +1 generalized, +1 BUE, +1BLE per RN shift assessment  Abdomen: surgical scar, tenderness, distended, firm, BS+ per RN shift assessment  Skin: abrasion elbow L, skin tear elbow R, skin tear abdomen midline per RN shift assessment    Anthropometrics:  Height: 170.2 cm (  '5\' 7"' )    Current Weight: 79.8 kg (176 lb) ; 110% IBW  Ideal Body Weight: 72.4 kg + 10%  BMI: 27.5 kg/(m^2) overweight  Weight Hx: Weight was stable x 5 months prior to admission; severe net loss of 8.1 kg (11.1%) x 2 months between 03/11/17-05/13/17, which appears to be actual loss.  Weight up since 3/11, correlating with increased edema. No actual recent weight.  06/27/2017 79.833 kg 176 lbs Actual, +1 generalized BUE and +2 BLE edema   06/18/2017 76.885 kg 169 lbs 8 oz Actual +1 generalized, abdomial and +2 BLE edema   05/28/2017 79.788 kg 175 lbs 14 oz Actual +1 generalized edema, trace  BUE, +2 BLE edema   05/13/2017 64.9 kg 143 lbs 1 oz -- race BLE edema, lowest weight since admission - likely closest to dry weight   05/06/2017 65.817 kg 145 lbs 2 oz Actual, trace generalized edema, BUE, +1 adbominal, BLE edema   04/17/2017 73.483 kg 162 lbs -- +1 generalized edema, BLE edema   04/09/2017 73.483 kg  162 lbs Actual   03/29/2017 72 kg 158 lbs 12 oz Actual admit weight with + 1 general, BLE edema   03/22/2017 71.215 kg 157 lbs --   03/11/2017 73 kg 160 lbs 15 oz --No edema per surgical oncology clinic note   03/06/2017 72.349 kg 159 lbs 8 oz --   01/29/2017 73.573 kg 162 lbs 3 oz Actual   01/28/2017 73 kg 160 lbs 15 oz Actual   12/14/2016 73.256 kg 161 lbs 8 oz Transcribed   11/16/2016 69.899 kg 154 lbs 2 oz Transcribed   10/30/2016 67.586 kg 149 lbs Actual   10/05/2016 71.3 kg 157 lbs 3 oz Actual     Estimated Nutrient Needs: (Based on low weight of admission 64.9 kg)    1625-1950 kcal/day (25-30 kcal/kg)   85-104 g protein/day (1.3-1.6 g/kg)    1625-1950 mL fluid/day (25-30 mL/kg)    Nutrition Assessment and Diagnosis:   TPN is ordered for tonight as follows: 2-in-1 TPN with lipids via adult central line; amino acid-dextrose solution @ 55 mL/hr x 24 hours (1320 mL fluid per day), with:  Amino acids 75 g/L  Dextrose 204 g/L  Na+ 106 meq/L   K+ 22 meq/L  Ca++ 4.5 meq/L  Mg 7 meq/L  P 3 mmol/L  Standard MVI  Standard trace elements  No insulin  Chloride: Acetate ratio- max chloride  AND: 54 g/day lipids (270 ml/day 20% soy lipid emulsion, given over 12 hours)  TPN with lipids provides a total of 1590 ml fluid, 1852 kcal, 99 g protein and 269 g dextrose per day.     AST/ALT have been persistently mildly elevated, alk phos persistently high, CRP persistently high. Carnitine was ordered for 5/2 but not for 5/3., She may benefit from resuming carnitine in TPN, and would consider trial of SMOF lipids.  She is at risk for iron and vit D deficiency due to extended period of reliance on TPN during this admission.     Pt was not requiring any sliding scale insulin coverage for > one week prior to transfer to ICU, has received only 1 unit x 2 so far on 814 per the [facility].  No need to add insulin to TPN.     Low potassium noted - repletion x 1 via IV.     Malnutrition Status: Pt with evidence of moderate malnutrition per nutrition note of  04/09/17; pt has received PN support since 04/09/17.  Nutrition Intervention:   1. TPN for Saturday 07/06/17:   2-in-1 TPN with lipids via adult central line; amino acid-dextrose solution @ 55 mL/hr x 24 hours (1320 mL fluid per day), with:  Amino acids 75 g/L  Dextrose 204 g/L  Na+ 106 meq/L   K+ 22 meq/L  Ca++ 4.5 meq/L  Mg 7 meq/L  P 3 mmol/L  Standard MVI  Standard trace elements  ADD: 200 mg levocarnitine  No insulin  Chloride: Acetate ratio- max chloride  AND: 54 g/day lipids (270 ml/day 20% SMOF lipid lipid emulsion, given over 12 hours)  TPN with lipids provides a total of 1590 ml fluid, 1852 kcal, 99 g protein and 269 g dextrose per day.   Over the weekend- the covering ICU RD can be contacted via the page office (day shifts) if there are any concerns regarding TPN.      2. Please obtain actual weight and monitor weekly.    3. When able to resume PO or TFs, recommend starting 2000 international unit vit D daily.  Consult for TF/PO recs as needed.     4. Recommend checking current iron panel, pt may require iron repletion.  TPN does not provide iron.  Inflammation may skew (elevate) ferritin levels.      5. Please continue parenteral monitoring panel. Recommended Monitoring Parameters:   PO4, Mg, and basic metabolic panel daily for 3 days or until stable, then 3 times per week. Replete lytes via IV as needed.   Comprehensive metabolic panel and triglycerides weekly. Hold lipids if TG>400.   Check BGs q 6 hours until stable on goal dextrose.   Monitor weight daily until fluid balance is stable, then weekly.   Accurate I&Os.      Nutrition Monitoring/Evaluation:   1. Will monitor TPN tolerance, nutrition-related labs, weight trend, BM pattern.   2. Will follow up per hgih nutrition risk protocol.    Ayesha Rumpf  Dietetic Intern, Pager [phone]

## 2017-07-05 NOTE — Invasive Procedure Plan of Care (Signed)
Invasive Procedure Plan of Care (Consent Form 419):   Condition(s) Addressed: Abnormal fluid collection which may be infected with drain placement.    Performing Provider: Christean Leaf, and Vascular and Interventional Radiology Attendings, Fellows, Residents and Advanced Practice Providers      Side:    Procedure: Fluid aspiration or drainage of hepatic fluid collection.    Special Equipment:    Planned Anesthesia: Moderate Sedation   Benefits: Symptomatic relief by aspirating or draining fluid. Obtain samples to guide further management   Risks: Bleeding (added risk of bleeding with blood vessel coursing through the collection), infection, peritonitis, fistula, injury to surrounding organs and structures, and in very rare circumstances death.   Alternatives: Not to perform the procedure   Expected Length of Stay: 0 day(s)     I, or a designated member of my surgical team, have discussed the planned procedure, including the potential for any transfusion of blood products or receipt of tissue as necessary, expected benefits, the potential complications and risks and possible alternatives and their benefits and risks with the patient or the patient's surrogate. In my opinion, the patent or the patient's surrogate understands the proposed procedure, its risks, benefits, and alternatives.    Electronically signed by Carmin Richmond, MD at 9:55 AM     Patient Consent:  I hereby give my consent and authorize Brihanna Devenport, Marca Ancona, and Vascular and Interventional Radiology Attendings, Fellows, Residents and Advanced Practice Providers     (The list of possible assistants, all of whom are privileged to provide surgical services at the hospital, is available)  To treat the following: Abnormal fluid collection which may be infected with drain placement.   Procedure includes: Fluid aspiration or drainage of hepatic fluid collection.   Laterality:   1 The care provider has explained my condition to me, the benefits of having the  above treatment procedure, and alternate ways of treating my condition. I understand that no guarantees have been made to me about the result of the treatment. The alternatives to this procedure include: Not to perform the procedure   2 The care provider has discussed with me the reasonably foreseeable risks of the treatment and that there may be undesirable results. The risks that are specifically related to this procedure include: Bleeding (added risk of bleeding with blood vessel coursing through the collection), infection, peritonitis, fistula, injury to surrounding organs and structures, and in very rare circumstances death.   3 I understand that during the treatment a condition may be discovered which was not known before the treatment started. Therefore, I authorize the care provider to perform any additional or different treatment which is thought necessary and available.   4 Any tissue, parts, or substances removed during the procedure may be retained or disposed of in accordance with customary scientific, educational and clinical practice.   5 Vendor information if appropriate:    6 If blood products are needed, I would agree:    7 If tissue products are needed, I would agree:    8 Blood/Tissue use limitations and/or exclusions:       I have carefully read and fully understand this informed consent form, and have had sufficient opportunity to discuss my condition and the above procedure(s) with the care provider and his/her associates, and all of my questions have been answered to my satisfaction. I understand that my surgeon/provider performing the procedure may not be physically present in the operating/procedure room the entire time that I am there. My surgeon/provider has  answered my questions regarding this and how it may relate to my surgery/procedure. I agree to the Plan of Care as outlined above.                       Patient Signature   (or Parent/Legal Guardian if pt is unable to sign or is a  minor)  Date/Time     Electronic Signatures will display at the bottom of the consent form.

## 2017-07-05 NOTE — Progress Notes (Signed)
Report Given To  Oda Kilts RN    Descriptive Sentence / Reason for Admission   PMHx: fibromyalgia, depression, hypothyroidism, pancreatic CA, PE Nov 2018  HPI: S/p Whipple on 1/25 c/b, mult IR procedures for drainage of abscesses, VRE bacteremia w/ mediport removal 4/8, s/p R pleural pigtail; s/p PICC placement; s/p IR PEG on 4/12 c/b posterior gastric perforation, s/p IR perc drain x2 & 4/19 CT revealed oral contrast passing into lesser sac/retroperitoneum. ID & Palliative (for pain) following  5/2: Increased O2 requirements and tachycardia (concerned for PE) on [facility]. Patient febrile to 39.6 with rising WBC. Admitted to SICU 814 with concern for sepsis. Heparin gtt started for suspicion of PE. Paused 0200 on 5/3 for new bleeding from L perc and drop in HCT 22 (28).  5/3: 2nd R perc drain placed in IR in intra-hepatic abscess (infected biloma)    Active Issues / Relevant Events   Ativan x2. Dilaudid x2 for breakthrough pain. L perc drain continues with bloody drainage. Heparin gtt remains on hold. SCDs applied for DVT prophylaxis in the interim. Second R perc drain placed in IR in intra-hepatic abscess. Cultures of fluid sent. CBC rechecked post-IR; HCT 23 (23). Pt tearful throughout day d/t anxiety, depression, and pain symptoms. Daughter at bedside. ABX adjusted to Zyvox, Merrem, and Caspo.     To Do List  Flush Perc drains Q8hrs with 90mL NS (0400, 1200, 2000)  Clamp PEG x1hr and unclamp to gravity x4hrs (Clamp 1800-1900)  BG Q6hrs    Anticipatory Guidance / Discharge Planning  ---Critical Care Bundle---    Fluids & Electrolytes: None  Nutrition/last BM: TPN & lipids/ BM 5/2  Mobility Plan/Ability: activity as tolerated  Respiratory weaning (PST. TC)/pulmonary toilet: [facility]  Ventilator settings/oxygen therapy orders correct? Yes  Neuro/sedation/pain/sedation vacation: PCA with basal rate and bolus dose; Dilaudid for breakthrough PRN, Ativan PRN, lidoderm patches  Delirium: No  (based on [facility] ICU)  Sleep  Hygiene: Yes / No if appropriate  List of antimicrobials: Zyvox, Merrem, Caspo,  Drains/Lines/Tubes: L PICC, PIV x2, PEG, perc x2  Foley/Indication: Yes- accurate I&Os  DVT prophylaxis/aPTT: SCDs (heparin on protocol)  Indication for gastric acid suppression: None  Type and frequency of Labs:   Daily: PT-INR, aPTT, Mg, Phos, CBC & diff, BMP  Weekly: Sed rate, CRP, CK  Every Monday: Trigycerides, prealbumin  Every Mo, Thurs: HFP  Family Concerns and Updates: None  Patient's preferred name (AKA): [name]  Code Status: FULL  Disposition/Level: ICU  Kari Baars, RN

## 2017-07-05 NOTE — Invasive Procedure Plan of Care (Signed)
Invasive Procedure Plan of Care (Consent Form 419):   Condition(s) Addressed: Need for moderate sedation during procedure   Performing Provider: Christean Leaf, and Vascular and Interventional Radiology Attendings, Fellows, Residents and Advanced Practice Providers     Side: Not applicable    Procedure: Establishment of moderate sedation   Special Equipment: none   Planned Anesthesia: Moderate Sedation   Benefits: State of altered consciousness created by administration of intravenous medications (medications in an IV). Patients under moderate sedation will feel sleepy/relaxed, but will respond to commands. The purpose of moderate sedation is to make you feel comfortable during your procedure, and can make your procedure easier to perform.   Risks: Excessive sedation, which may require special procedures to keep you safe.  Nausea, vomiting, problems with heart rate or blood pressure, breathing difficilties, very rarely death.   Alternatives: Not undergoing procedure, undergoing procedure without sedation.   Expected Length of Stay:  day(s)     I, or a designated member of my surgical team, have discussed the planned procedure, including the potential for any transfusion of blood products or receipt of tissue as necessary, expected benefits, the potential complications and risks and possible alternatives and their benefits and risks with the patient or the patient's surrogate. In my opinion, the patent or the patient's surrogate understands the proposed procedure, its risks, benefits, and alternatives.    Electronically signed by Carmin Richmond, MD at 9:56 AM     Patient Consent:  I hereby give my consent and authorize Belem Hintze, Marca Ancona, and Vascular and Interventional Radiology Attendings, Fellows, Residents and Advanced Practice Providers    (The list of possible assistants, all of whom are privileged to provide surgical services at the hospital, is available)  To treat the following: Need for moderate sedation  during procedure  Procedure includes: Establishment of moderate sedation  Laterality: Not applicable  1 The care provider has explained my condition to me, the benefits of having the above treatment procedure, and alternate ways of treating my condition. I understand that no guarantees have been made to me about the result of the treatment. The alternatives to this procedure include: Not undergoing procedure, undergoing procedure without sedation.   2 The care provider has discussed with me the reasonably foreseeable risks of the treatment and that there may be undesirable results. The risks that are specifically related to this procedure include: Excessive sedation, which may require special procedures to keep you safe.  Nausea, vomiting, problems with heart rate or blood pressure, breathing difficilties, very rarely death.   3 I understand that during the treatment a condition may be discovered which was not known before the treatment started. Therefore, I authorize the care provider to perform any additional or different treatment which is thought necessary and available.   4 Any tissue, parts, or substances removed during the procedure may be retained or disposed of in accordance with customary scientific, educational and clinical practice.   5 Vendor information if appropriate: If a vendor representative is expected to be present during my procedure, it has been explained to me that the vendor representative works for (manufacturer of the device to be used) and that his/her role includes . I consent to the vendor representative's presence and involvement as described. If circumstances change and a decision is made during my procedure that a vendor representative's presence is needed, I will be notified of the above after my procedure is completed.  Equipment: none   6 If blood products are needed, I  would agree:    7 If tissue products are needed, I would agree:    8 Blood/Tissue use limitations and/or  exclusions:       I have carefully read and fully understand this informed consent form, and have had sufficient opportunity to discuss my condition and the above procedure(s) with the care provider and his/her associates, and all of my questions have been answered to my satisfaction. I understand that my surgeon/provider performing the procedure may not be physically present in the operating/procedure room the entire time that I am there. My surgeon/provider has answered my questions regarding this and how it may relate to my surgery/procedure. I agree to the Plan of Care as outlined above.                       Patient Signature   (or Parent/Legal Guardian if pt is unable to sign or is a minor)  Date/Time     Electronic Signatures will display at the bottom of the consent form.

## 2017-07-05 NOTE — Progress Notes (Addendum)
SICU Progress Note    Amanda Galloway is a [name] w/ PMH fibromyalgia, pancreatic CA s/p Whipple for 'cure' 0/86/57 with complicated prolonged course including ICU & intub (extub 2/17), mult IR procedures for drainage of abscesses, VRE bacteremia w/ mediport removal 4/8, s/p R pleural pigtail; s/p PICC after neg bld cx; s/p IR PEG on 4/12, and follow up CT & UGI w/ revealing posterior gastric perforation, s/p IR perc drain x 2 when 4/19 CT revealed oral contrast passing into lesser sac/retroperitoneum. SICU was consulted on 5/2 for increased O2 requirements and tachycardia. Patient found to be febrile to 39.6 with rising WBC, admitted to SICU with concern for sepsis.       Interval History: last night around 11 pm, she was tachypnea, desaturating, and tachycardia.  Returned her to 2 L NC. Febrile to 39 at midnight, Hct dropped and heparin drip was stopped. When I meet her this morning, she is very anxious and talking in tears. She complains of abdominal pain that is worse that yesterday. Ativan will be given.       Past Med/Sx History:   Past Medical History:   Diagnosis Date    Acute kidney failure 03/31/2017    Cancer     Depression     Fibromyalgia     Hypothyroidism          Physical Exam by Systems: BP slightly soft, not on pressors  Constitutional: lying in bed, tachy penia  on 2 l NC   Eyes: NAD  Ears, Nose, Mouth, and Throat: NAD  Cardiovascular: RRR  Respiratory: diminished air entry basal bilateral   Gastrointestinal: abd soft, very tender mostly around the drain , perc drain x 2, PEG intact  Genitourinary: foley's in place   Musculoskeletal: MAE  Skin: WWP  Neurologic: alert   Psychiatric: anxious and in tears   Lines/Drains/Tubes: 4/9 PIV R wrist, PICC LUE site benigh (4/11), PEG (4/12), Left abdominal drain, Right hepatic drain    Assessment and Plan for Active/Followed Hospital Problems:   Active Hospital Problems    Diagnosis    *!*Pancreatic adenocarcinoma s/p Whipple 03/29/17     - Whipple on  1/25 with Dr. 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 Keep G-tube to gravity  - IR drain today for repositioning vs replacement, heparin drip off 2 hrs before   - ID following for multiple intra-abdominal fluid collections: afebrile this morning, WBCs STABLE 15.9   s/p enterobacter cloacae bacteremia, Currently cultures + VRE, C.Glabrata and Enterobacter -> D/C Daptomycin and ertapenem ->  Start Linezolid 600 mg IV twice daily (in place of Daptomycin) watching carefully for serotonin syndrome given she was on methadone (discontinued 10 days ago), Continue meropenem 1 gram IV every 8 hours and caspofungin 50 mg IV daily      Sepsis     -  Afebrile this morning, WBC stable 15.8  - On caspo, meropenem, and Linezolid   - Followed by ID: the change in  patient status is suggestive of active infection, however pneumonia is low on differential given absence of respiratory symptoms (cough/dyspnea), CXR review,  and O2 demands seem to be quickly resolving.  It is not clear if sepsis 2/2 intraabdominal source is the culprit however CT of a/p 5/1 showed collections have overall improved and liver abscess successfully drained (this drain could be pulled).  There remains a persistent fluid collection inferior to the current pigtail in right liver lobe which needs to be drained for source control  - CT scan 5/1       Acute pulmonary insufficiency     - Intubated 2/12 prior to [facility] placement, extubated 2/17   - On 2 LNC currently  -  chest x ray 5/2 showed Moderate bilateral pleural effusions with bibasilar opacities which may represent atelectasis or pneumonia. Superimposed pulmonary edema.       Anemia     - H/H stable 7/23  - Will monitor and transfuse prn      Cancer associated pain     - Methadone at home of 10 days ago   - Pain management guided by patient's primary pain physician Dr Irven Baltimore  - Palliative following  - Currently has dilaudid PCA with cont rate  - PRN ativan, holding scheduled during critical illness      Depression     -Continue Elavil and Lexapro       hx of Pulmonary embolism     - PE 01/18/17 on CT chest   - This hospitalization has been on therapeutic Lovenox, and Heparin gtt Off   - IR today  Heparin 2 hrs off prior   - [name] left UE 5/2 -> No evidence of venous thrombosis in the visualized deep veins left upper extremity. There is limited evaluation of the lower portion of the left brachial vein due to overlying bandages  - PTT 58.3, platelet 459       Hypothyroidism     -Synthroid          Diagnoses for this hospitalization include:  Acute Respiratory Failure and Severe Sepsis       ---SICU Critical Care  Bundle---    Fluids & Electrolytes: None  Nutrition/last BM: TPN & lipids/ BM 5/2  Mobility Plan/Ability: Ambulate with front wheel walker and 2 person assist  Respiratory weaning (PST. TC)/pulmonary toilet: RA   Ventilator settings/oxygen therapy orders correct? Yes  Neuro/sedation/pain/sedation vacation: PCA with basal rate and bolus dose  Delirium: Yes/No (based on [facility] ICU)  Sleep Hygiene: Yes / No if appropriate  List of antimicrobials: Caspo, dapto, vanco, merapenem  Drains/Lines/Tubes: L PICC, PIV x2, PEG, perc x2  Foley/Indication: Yes- accurate I&Os  DVT prophylaxis/aPTT: heparin gtt  Indication for gastric acid suppression: None  Type and frequency of Labs:   Daily: PT-INR, aPTT, Mg, Phos, CBC & diff, BMP  Weekly: Sed rate, CK  Every Monday: Trigycerides, prealbumin  Every Mo, Thurs: HFP  Family  Concerns and Updates: None  Patient's preferred name (AKA): Amanda Galloway  Code Status: FULL    Author: Philemon Kingdom, MD  as of: 07/05/2017  at: 8:11 AM

## 2017-07-05 NOTE — Interdisciplinary Rounds (Addendum)
HPI: [name] w/ PMH fibromyalgia, pancreatic CA s/p Whipple for 'cure' 03/29/17 with unfortunately complicated prolonged course including ICU & intub (extub 2/17), mult IR procedures for drainage of abscesses, VRE bacteremia w/ mediport removal 4/8, s/p R pleural pigtail; s/p PICC after neg bld cx; s/p IR PEG on 4/12 w/ increased pain & FU CT & UGI w/ revealing posterior gastric perforation, s/p IR perc drain x 2 & 4/19 CT revealed oral contrast passing into lesser sac/retroperitoneum.    Foley: yes (07/04/17)  Central Line: PICC (06/13/17)  DVT prophylaxis: (SQ heparin on hold) /SCDs  Indication for gastric acid suppression: NI  Sedation: Ativan PRN  Mobility: Up ad lib  Flu Vaccine screen: Complete    07/05/17 Daily Interdisciplinary Goals of Care  New perc drain placed in intra-hepatic abscess and fluid cultured  SCD's  Continue holding heparin; check CBC after IR     07/04/17 Daily Interdisciplinary Goals of Care  Consult ID  Reviewed ABX regimen - Vancomycin x 1   Ertapenem to Meropenem  Unable to obtain sputum culture  Heparin gtt  U/S of PICC arm for DVT

## 2017-07-05 NOTE — Plan of Care (Signed)
Bowel Elimination     Elimination patterns are normal or improving Maintaining        Fluid and Electrolyte Imbalance     Fluid and Electrolyte imbalance Maintaining        GI Bleeding Elimination     Elimination of patterns are normal or improving Maintaining        Mobility     Functional status is maintained or improved - Geriatric Maintaining     Patient's functional status is maintained or improved Maintaining        Nutrition     Patient's nutritional status is maintained or improved Maintaining     Nutritional status is maintained or improved - Geriatric Maintaining        Pain/Comfort     Patient's pain or discomfort is manageable Maintaining     Patient's pain or discomfort is manageable Maintaining        Post-Operative Bowel Elimination     Elimination pattern is normal or improving Maintaining        Post-Operative Hemodynamic Stability     Maintain Hemodynamic Stability Maintaining        Psychosocial     Demonstrates ability to cope with illness Maintaining        Safety     Patient will remain free of falls Maintaining

## 2017-07-05 NOTE — Progress Notes (Addendum)
Interventional Radiology Pre-Procedure Handoff/Checklist    NPO: [x] YES [] NO [] N/A      Tube feed Stopped: [] YES [] NO [x] N/A     Anticoagulants:heparin drip    Telemetry: [x] YES [] NO [] N/A -NSR     Transport mode: Bed  Number of Transporters needed: 2        IV access:   PICC Double Lumen (Power) 06/13/17 Clamped Left Upper Extremity (Active)   Phlebitis Scale Grade 0 07/05/2017  4:00 AM   Infiltration Scale Grade 0 07/05/2017  4:00 AM   Proximal Lumen Status Flushed;Infusing 07/05/2017  4:00 AM   Distal Lumen Status Flushed;Infusing 07/05/2017  4:00 AM   External Length Check (cm) 1 cm 07/04/2017  4:00 AM   AC Circumference (cm) 28 cm 07/04/2017  4:00 AM   Upper Arm Circumference (cm) 29.5 cm 07/04/2017  4:00 AM   Dressing Type Transparent;BioPatch 07/05/2017  4:00 AM   Dressing Status Clean, dry and intact 07/05/2017  4:00 AM   (T)Transparent Drsg Change-Q7D Dressing changed 07/04/2017  4:00 AM   Dressing Change Due 07/11/17 07/05/2017  4:00 AM   Bag to hub changed? No 07/05/2017  4:00 AM   Next bag to hub change due 07/08/17 07/05/2017  4:00 AM   Cap(s) Changed? No 07/05/2017  4:00 AM   Next cap change due 07/08/17 07/05/2017  4:00 AM   **Need for continuing central line addressed? Yes - central line still necessary 07/05/2017  4:00 AM       Peripheral IV 06/11/17 0900 Right Wrist (Active)   Phlebitis Scale Grade 0 07/05/2017  4:00 AM   Infiltration Scale Grade 0 07/05/2017  4:00 AM   Line Status Flushed;Saline locked 07/05/2017  4:00 AM   Dressing Type Transparent 07/05/2017  4:00 AM   Dressing Status Clean, dry and intact 07/05/2017  4:00 AM       Peripheral IV 07/04/17 1335 Right Hand (Active)   Phlebitis Scale Grade 0 07/05/2017  4:00 AM   Infiltration Scale Grade 0 07/05/2017  4:00 AM   Line Status Flushed;Infusing 07/05/2017  4:00 AM   Dressing Type Transparent 07/05/2017  4:00 AM   Dressing Status Clean, dry and intact 07/05/2017  4:00 AM       Respiratory: O2 Nasal Cannula 2 liters    Consentable:yes    Alert and Oriented to person, place and time?:  yes    Code Status:Full     Does patient wear an insulin pump? [] YES [] NO [x] N/A     Precautions:none    Allergies: No Known Allergies (drug, envir, food or latex)    Okey Dupre Cathren Sween, RN Received Handoff Report from [place], RN for IR procedure 7:43 AM

## 2017-07-06 ENCOUNTER — Inpatient Hospital Stay: Payer: Medicare (Managed Care)

## 2017-07-06 DIAGNOSIS — L853 Xerosis cutis: Secondary | ICD-10-CM

## 2017-07-06 LAB — EKG 12-LEAD
P: 36 deg
PR: 148 ms
QRS: 34 deg
QRSD: 68 ms
QT: 260 ms
QTc: 392 ms
Rate: 136 {beats}/min
Severity: BORDERLINE
Statement: BORDERLINE
T: 37 deg

## 2017-07-06 LAB — CBC AND DIFFERENTIAL
Baso # K/uL: 0.1 10*3/uL (ref 0.0–0.1)
Basophil %: 0.6 %
Eos # K/uL: 0.4 10*3/uL (ref 0.0–0.4)
Eosinophil %: 3.3 %
Hematocrit: 25 % — ABNORMAL LOW (ref 34–45)
Hemoglobin: 7.8 g/dL — ABNORMAL LOW (ref 11.2–15.7)
IMM Granulocytes #: 0.1 10*3/uL
IMM Granulocytes: 0.5 %
Lymph # K/uL: 0.4 10*3/uL — ABNORMAL LOW (ref 1.2–3.7)
Lymphocyte %: 3.8 %
MCH: 28 pg/cell (ref 26–32)
MCHC: 32 g/dL (ref 32–36)
MCV: 89 fL (ref 79–95)
Mono # K/uL: 0.5 10*3/uL (ref 0.2–0.9)
Monocyte %: 4.4 %
Neut # K/uL: 9.8 10*3/uL — ABNORMAL HIGH (ref 1.6–6.1)
Nucl RBC # K/uL: 0 10*3/uL (ref 0.0–0.0)
Nucl RBC %: 0 /100 WBC (ref 0.0–0.2)
Platelets: 458 10*3/uL — ABNORMAL HIGH (ref 160–370)
RBC: 2.8 MIL/uL — ABNORMAL LOW (ref 3.9–5.2)
RDW: 16.7 % — ABNORMAL HIGH (ref 11.7–14.4)
Seg Neut %: 87.4 %
WBC: 11.3 10*3/uL — ABNORMAL HIGH (ref 4.0–10.0)

## 2017-07-06 LAB — BASIC METABOLIC PANEL
Anion Gap: 13 (ref 7–16)
CO2: 25 mmol/L (ref 20–28)
Calcium: 8.3 mg/dL — ABNORMAL LOW (ref 8.6–10.2)
Chloride: 96 mmol/L (ref 96–108)
Creatinine: 0.47 mg/dL — ABNORMAL LOW (ref 0.51–0.95)
GFR,Black: 115 *
GFR,Caucasian: 100 *
Glucose: 131 mg/dL — ABNORMAL HIGH (ref 60–99)
Lab: 17 mg/dL (ref 6–20)
Potassium: 4 mmol/L (ref 3.3–5.1)
Sodium: 134 mmol/L (ref 133–145)

## 2017-07-06 LAB — CK: CK: 111 U/L (ref 26–192)

## 2017-07-06 LAB — PROTIME-INR
INR: 1.3 — ABNORMAL HIGH (ref 0.9–1.1)
Protime: 14.6 s — ABNORMAL HIGH (ref 10.0–12.9)

## 2017-07-06 LAB — GRAM STAIN

## 2017-07-06 LAB — POCT GLUCOSE
Glucose POCT: 136 mg/dL — ABNORMAL HIGH (ref 60–99)
Glucose POCT: 136 mg/dL — ABNORMAL HIGH (ref 60–99)
Glucose POCT: 142 mg/dL — ABNORMAL HIGH (ref 60–99)
Glucose POCT: 159 mg/dL — ABNORMAL HIGH (ref 60–99)

## 2017-07-06 LAB — APTT: aPTT: 29.9 s (ref 25.8–37.9)

## 2017-07-06 LAB — MAGNESIUM: Magnesium: 1.5 mg/dL — ABNORMAL LOW (ref 1.6–2.5)

## 2017-07-06 LAB — PHOSPHORUS: Phosphorus: 2.7 mg/dL (ref 2.7–4.5)

## 2017-07-06 MED ORDER — STERILE WATER FOR INJECTION (TPN USE ONLY) *I*
INTRAVENOUS | Status: AC
Start: 2017-07-06 — End: 2017-07-07
  Filled 2017-07-06: qty 660

## 2017-07-06 MED ORDER — MAGNESIUM SULFATE 2 GM IN 50 ML *WRAPPED*
2000.0000 mg | Freq: Once | INTRAVENOUS | Status: AC
Start: 2017-07-06 — End: 2017-07-06
  Administered 2017-07-06: 2000 mg via INTRAVENOUS
  Filled 2017-07-06: qty 50

## 2017-07-06 MED ORDER — HEPARIN SODIUM 5000 UNIT/ML SQ *I*
5000.0000 [IU] | Freq: Three times a day (TID) | SUBCUTANEOUS | Status: DC
Start: 2017-07-06 — End: 2017-07-07
  Administered 2017-07-06 – 2017-07-07 (×4): 5000 [IU] via SUBCUTANEOUS
  Filled 2017-07-06 (×4): qty 1

## 2017-07-06 MED ORDER — LEVALBUTEROL HCL 0.63 MG/3ML IN NEBU *I*
0.6300 mg | INHALATION_SOLUTION | Freq: Once | RESPIRATORY_TRACT | Status: AC
Start: 2017-07-06 — End: 2017-07-06
  Administered 2017-07-06: 0.63 mg via RESPIRATORY_TRACT
  Filled 2017-07-06: qty 3

## 2017-07-06 MED ORDER — FAT EMULSION SOYBEAN OIL 20 % IV EMUL *WRAPPED*
54.0000 g | Freq: Every day | INTRAVENOUS | Status: DC
Start: 2017-07-06 — End: 2017-07-06

## 2017-07-06 MED ORDER — FAT EMULSION SOY/MCT/OLIVE/FISH OIL 20 % IV EMUL *I*
54.0000 g | Freq: Every day | INTRAVENOUS | Status: AC
Start: 2017-07-06 — End: 2017-07-07
  Administered 2017-07-06 – 2017-07-07 (×2): 54 g via INTRAVENOUS
  Filled 2017-07-06: qty 270

## 2017-07-06 NOTE — Progress Notes (Signed)
Critical Care Attending Physician note    IR placed a new perihepatic drain yesterday.  This morning, febrile and tachycardic.  Increased O2 requirement to 4L.      Blood pressure 131/63, pulse (!) 113, temperature (!) 39.9 C (103.8 F), temperature source Temporal, resp. rate (!) 29, height 1.702 m (5\' 7" ), weight 79.8 kg (176 lb), SpO2 92 %.      General: tearful   Neck: no jugular venous distention, no adenopathy  Chest: diminished at the bases.  Mildly tachypneic  Car: rrr  Abd: abd firm, ttp particularly around the drain sites.  Peg in place  Ext: There is no clubbing.  There is no cyanosis.  There is trace edema.  Neuro: anxious, tearful      SYNOPSIS OF OUR ASSESSMENT AND PLANS:    1. GI/ID  #pancreatic adenocarcinoma s/p whipple on 1/25 with Dr Ron Agee  #multiple intraabdominal fluid infections  #sepsis  - continue tpn, g-tube to gravity  - s/p new IR drain to RUQ yesterday - possible SIRS response   - will do bedside US of R pleural space to see if she has an effusion amenable to bedside thoracentesis  - continue linezolid (monitor for signs of serotonin syndrome); continue meropenem and caspofungin.  If fever curve remains elevated, may need to consider switching back to dapto later today or tomorrow   - ID following and their input is appreciated    2. Pulmonary  #concern for pneumonia  #history of pulmonary embolism  #bilateral pleural effusions  - changed from dapto to linezolid on 5/3 as above for better pulmonary coverage  - cxr today (personally reviewed) looks like worsening L-sided effusion with possible evolving right middle lobe infiltrate.     3. Heme  #anemia  #history of VTE  - continue to hold heparin drip but start sqh every 8 hours.  Likely to resume heparin infusion tomorrow  - SCDs for dvt ppx    4. Neuro/pain  #cancer-related pain  #anxiety  #delirium  - continue dilaudid pca; palliative following  - holding home  methadone        ______________________________________________________________________  This patient was evaluated on rounds with the resident physician, Dr. Hale Drone.  I personally examined the patient.  All nursing documentation, laboratory data, test results, and radiographs were reviewed and interpreted by me.  I have established the management plan for this patient's critical illness and have been immediately available to assist with patient care.  I agree with the database, findings, and plan of care recorded in the resident physician note.    This patient is critically ill with at least 1 organ system failure associated with a high probability of imminent or life threatening deterioration. The care I have delivered involved high complexity decision making to assess, manipulate, and support vital system function(s), to treat the vital organ system failure and/or prevent further life threatening deterioration of the patient's condition.     Critical care time: I spent 34 minutes personally attending to this patient's critical care needs. This critical care time is exclusive of any time for separately billable procedures    Earnest Bailey, MD, MSHP

## 2017-07-06 NOTE — Progress Notes (Addendum)
SICU Progress Note    Amanda Galloway is a [name] w/ PMH fibromyalgia, pancreatic CA s/p Whipple for 'cure' 6/57/84 with complicated prolonged course including ICU & intub (extub 2/17), mult IR procedures for drainage of abscesses, VRE bacteremia w/ mediport removal 4/8, s/p R pleural pigtail; s/p PICC after neg bld cx; s/p IR PEG on 4/12, and follow upCT &UGI w/ revealing posterior gastric perforation, s/p IR perc drain x 2 when4/19 CT revealed oral contrast passing into lesser sac/retroperitoneum. SICU was consulted on 5/2 for increased O2 requirements and tachycardia. Patient found to be febrile to 39.6 with rising WBC, admitted to SICU with concern for sepsis.       Interval History: febrile over night, repeated culture, IR replaced 5/3.     Past Med/Sx History:  Past Medical History:   Diagnosis Date    Acute kidney failure 03/31/2017    Cancer     Depression     Fibromyalgia     Hypothyroidism        Physical Exam by Systems:  Constitutional: lying in bed, tachy penia on 4 L NC   Eyes: NAD  Ears, Nose, Mouth, and Throat: NAD  Cardiovascular: RRR  Respiratory: diminished air entry basal bilateral   Gastrointestinal: abd soft, very tender mostly around the drain , perc drain x 2, PEG intact  Genitourinary: foley's in place   Musculoskeletal: MAE  Skin: WWP  Neurologic: alert   Psychiatric: anxious and in tears   Lines/Drains/Tubes: 4/9 PIV R wrist, PICC LUE site benigh (4/11), PEG (4/12), Left abdominal drain, Right hepatic drain    Assessment and Plan for Active/Followed Hospital Problems:   Active Hospital Problems    Diagnosis    *!*Pancreatic adenocarcinoma s/p Whipple 03/29/17     - Whipple on 1/25 with Dr. Ron Agee  [facility] course c/b gastric outlet obstruction d/t severe compression of the main portal vein and common bile duct by hematoma, ICU & intub (extub 2/17), mult IR procedures for drainage of abscesses, VRE bacteremia w/ mediport removal 4/8, s/p R pleural pigtail; s/p PICC after neg bld cx;  s/p IR PEG on 4/12, and follow up CT & UGI w/ revealing posterior gastric perforation, s/p IR perc drain x 2 & 4/19 CT revealed oral contrast passing into lesser sac/retroperitoneum  - Continue TPN, with SSI for glucose control, Keep NPO for now. Keep G-tube to gravity  - IR drain today replacement 5/3 -> Placement of an 8 French drain into right intra-hepatic abscess.  Contrast injection demonstrates communication with the adjacent bile ducts (infected biloma). HBP team aware.   - ID following for multiple intra-abdominal fluid collections:    s/p enterobacter cloacae bacteremia, Currently cultures + VRE, C.Glabrata and Enterobacter -> D/C Daptomycin and ertapenem ->  Started on Linezolid 600 mg IV twice daily 5/2  (in place of Daptomycin) watching carefully for serotonin syndrome given she was on methadone (d/c'd on 4/23)), Continue meropenem 1 gram IV every 8 hours and caspofungin 50 mg IV daily        Sepsis     -   Still feverish this morning, WBC trending down 15.8 -> 11.3  - Started on Linezolid 600 mg IV twice daily 5/2  (in place of Daptomycin) watching carefully for serotonin syndrome given she was on methadone (d/c'd on 4/23)), Continue meropenem 1 gram IV every 8 hours and caspofungin 50 mg IV daily  - Followed by ID: the change in patient status is suggestive of active infection, however  pneumonia is low on differential given absence of respiratory symptoms (cough/dyspnea), CXR review,  and O2 demands seem to be quickly resolving.  It is not clear if sepsis 2/2 intraabdominal source is the culprit however CT of a/p 5/1 showed collections have overall improved and liver abscess successfully drained (this drain could be pulled).  There remains a persistent fluid collection inferior to the current pigtail in right liver lobe which needs to be drained for source control -> IR replaced drain 5/3   - CT scan 5/1   - culture from the drain showed gram + cocci in chain   -  Consider pleural tap for worsening  pleural effusion       Acute pulmonary insufficiency     - Intubated 2/12 prior to [facility] placement, extubated 2/17   -  Increased oxygen requirementS over the last 24 hrs now ion 4 l NC   - chest x ray 5/2 showed Moderate bilateral pleural effusions with bibasilar opacities which may represent atelectasis or pneumonia. Superimposed pulmonary edema.   - Consider pleural tap for worsening pleural effusion       Anemia     - H/H stable 7/23  - Will monitor and transfuse prn      Cancer associated pain     - Methadone at home, last dose was 4/21   - Pain management guided by patient's primary pain physician Dr Irven [place]  - Palliative following  - Currently has dilaudid PCA with cont rate  - PRN ativan, holding scheduled during critical illness      Depression     - d/c Elavil and Lexapro       hx of Pulmonary embolism     - PE 01/18/17 on CT chest   - This hospitalization has been on therapeutic Lovenox, and Heparin gtt Off   - [name] left UE 5/2 -> No evidence of venous thrombosis in the visualized deep veins left upper extremity. There is limited evaluation of the lower portion of the left brachial vein due to overlying bandages  - PTT 58.3, platelet 458  - start sq heparin       Hypothyroidism     -Synthroid          Diagnoses for this hospitalization include:  Severe Sepsis          Author: Philemon Kingdom, MD  as of: 07/06/2017  at: 10:18 AM

## 2017-07-06 NOTE — Interdisciplinary Rounds (Addendum)
HPI: [name] w/ PMH fibromyalgia, pancreatic CA s/p Whipple for 'cure' 03/29/17 with unfortunately complicated prolonged course including ICU & intub (extub 2/17), mult IR procedures for drainage of abscesses, VRE bacteremia w/ mediport removal 4/8, s/p R pleural pigtail; s/p PICC after neg bld cx; s/p IR PEG on 4/12 w/ increased pain & FU CT & UGI w/ revealing posterior gastric perforation, s/p IR perc drain x 2 & 4/19 CT revealed oral contrast passing into lesser sac/retroperitoneum.    Foley: yes (07/04/17)  Central Line: PICC (06/13/17)  DVT prophylaxis: (SQ heparin on hold) /SCDs  Indication for gastric acid suppression: NI  Sedation: Ativan PRN  Mobility: Up ad lib  Flu Vaccine screen: Complete    07/06/17 Daily Interdisciplinary Goals of Care  CXR this morning, worsening pleural effusion  Tmax 39.9, Watch for Serotonin Syndrome  Start SQ Heparin, may restart Heparin gtt tomorrow  Increase PCA dose  Continue NPO    07/05/17 Daily Interdisciplinary Goals of Care  New perc drain placed in intra-hepatic abscess and fluid cultured  SCD's  Continue holding heparin; check CBC after IR     07/04/17 Daily Interdisciplinary Goals of Care  Consult ID  Reviewed ABX regimen - Vancomycin x 1   Ertapenem to Meropenem  Unable to obtain sputum culture  Heparin gtt  U/S of PICC arm for DVT

## 2017-07-06 NOTE — Progress Notes (Signed)
Palliative Care Progress Note    Patient seen by me 07/06/2017 0945  Reason for Visit: Left sided abdominal pain; Anxiety; Nausea; Dry skin; Constipation; Depression; Fatigue    Subjective: Left-sided abdominal pain, 8/10 severity, sharp, constant. She feels she needs to urinate. Dry skin to legs.  Review of Pain Monitoring report shows use of hydromorphone IV PCA 0.6mg  q59min prn with continuous 0.5mg /h, past 24 hours 7a-7a 31.85mg  total, 35 demands, 29 deliveries, and use the prior 24 hours 37.48mg .     Palliative Care ROS:  Pain   Severe  Nausea   Mild  Anxiety   Mild  Shortness of Breath   None  Tiredness/Fatigue   Mild  Constipation   no    Physical Examination:   BP: (111-170)/(55-105)   Temp:  [36.5 C (97.7 F)-39.9 C (103.9 F)]   Temp src: Temporal (05/04 0820)  Heart Rate:  [86-137]   Resp:  [18-45]   SpO2:  [85 %-99 %]   Moderate distress, tearful. Awake, alert, cooperative.  Respiratory effort normal.  Warm.  Urinary catheter.    Assessment/Plan:  72 yo female with pancreatic cancer, Whipple procedure January 6213, prolonged complicated hospital course including now-resolved posterior gastric wall perforation, abdominal abscesses with drains, bacteremia and hypoxia, and most recently right hepatic abscess drain placed. On TPN with bowel rest, methadone has been weaned off, with analgesia now by hydromorphone IV PCA.    Left sided abdominal pain: Average hydromorphone IV use, 31.85mg /24h = 1.3mg /h. Increase hydromorphone IV PCA and continuous infusion dose:  Hydromorphone IV PCA 1mg  q64min prn PCA, and 1mg /h continuous infusion.    Anxiety: Lorazepam IV prn.    Nausea: Ondansetron IV prn, promethazine IV prn.    Fatigue: Monitor.    Constipation: Bisacodyl suppository daily.    Dry skin to legs: Aquaphor prn.    Depressed mood: On linezolid, and presently NPO. Route limits available mood agents, and linezolid has associated risk of serotonin syndrome. Monitor.    Total Time Spent 35 minutes:   >50% of  time was spent in counseling and/or coordination of care.    Author: Sharmon Leyden, MD     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    Anemia D64.9    Hypothyroidism E03.9    Pancreatic cancer C25.9    Anemia D64.9     hx of Pulmonary embolism I26.99    Acute pulmonary insufficiency J98.4    Depression F32.9    Sepsis A41.9       No Known Allergies (drug, envir, food or latex)    Scheduled Meds:    fat emulsion - soy/MCT/olive/fish oil  54 g Intravenous Daily @ 1800    meropenem  1,000 mg Intravenous Q8H    linezolid  600 mg Intravenous Q12H    insulin lispro  0-20 Units Subcutaneous Q6H    caspofungin  50 mg Intravenous Q24H    levothyroxine IV  69 mcg Intravenous Daily    lidocaine  1 patch Transdermal Q24H    lidocaine  1 patch Transdermal Q24H    bisacodyl  10 mg Rectal Daily       Continuous Infusions:    TPN (2 in 1) Adult Continuous      TPN (2 in 1) Adult Continuous 55 mL/hr at 07/06/17 0759    heparin Stopped (  07/05/17 0230)    HYDROmorphone         PRN Meds:  DVT-PE Adult Heparin Protocol **AND** [COMPLETED] heparin **AND** heparin **AND** heparin **AND** APTT **AND** Platelet count- Heparin **AND** [CANCELED] Platelet count- Heparin **AND** Platelet count- Heparin **AND** Baseline and consecutive platelet counts are being monitored for heparin induced thrombocytopenia while on IV unfractioned heparin per protocol, phenazopyridine, acetaminophen, LORazepam, HYDROmorphone PF, Nursing communication- Give 4 OZ of fruit juice for BG < 70 mg/dl **AND** dextrose **AND** dextrose **AND** glucagon **AND** POCT glucose, naloxone, mineral oil-hydrophilic petrolatum, promethazine, sodium chloride, sodium chloride, sodium chloride, dextrose, ondansetron

## 2017-07-06 NOTE — Progress Notes (Signed)
Report Given To  Oda Kilts RN      Descriptive Sentence / Reason for Admission   PMHx: fibromyalgia, depression, hypothyroidism, pancreatic CA, PE Nov 2018  HPI: S/p Whipple on 1/25 c/b, mult IR procedures for drainage of abscesses, VRE bacteremia w/ mediport removal 4/8, s/p R pleural pigtail; s/p PICC placement; s/p IR PEG on 4/12 c/b posterior gastric perforation, s/p IR perc drain x2 & 4/19 CT revealed oral contrast passing into lesser sac/retroperitoneum. ID & Palliative (for pain) following  5/2: Increased O2 requirements and tachycardia (concerned for PE) on [facility]. Patient febrile to 39.6 with rising WBC. Admitted to SICU 814 with concern for sepsis. Heparin gtt started for suspicion of PE. Paused 0200 on 5/3 for new bleeding from L perc and drop in HCT 22 (28).  5/3: 2nd R perc drain placed in IR in intra-hepatic abscess (infected biloma)      Active Issues / Relevant Events   CXR done this AM for increased O2 needs and fever overnight; revealed worsening L pleural effusion and a stable R pleural effusion with some LLL atelectasis. Effusions evaluated at bedside by SICU team- no good window to drain but will continue to monitor. CPT  via bed x2. Flutter valve encouraged. Tmax 39.9. Tylenol suppository given with intermittent effect- febrile again. Started on SQ heparin. L perc drain remains bloody with overall low output. Ativan given x1. Dilaudid PCA basal rate and bolus dose doubled to 1mg  each per palliative recommendations. Pain is much more controlled but pt is becoming significantly more lethargic. OOBTC with hoverslide x3h. Tolerated ok.       To Do List  Flush Perc drains Q8hrs with 54mL NS (0400, 1200, 2000)  Clamp x4h PEG and unclamp x1h  BG Q6hrs  Monitor for serotonin syndrome      Anticipatory Guidance / Discharge Planning  ---Critical Care Bundle---    Fluids & Electrolytes: None  Nutrition/last BM: TPN & lipids/ BM 5/4  Mobility Plan/Ability: activity as tolerated  Respiratory weaning (PST.  TC)/pulmonary toilet: [facility]  Ventilator settings/oxygen therapy orders correct? Yes  Neuro/sedation/pain/sedation vacation: PCA with basal rate and bolus dose; Dilaudid for breakthrough PRN, Ativan PRN, lidoderm patches  Delirium: No  (based on [facility] ICU)   Sleep Hygiene: Yes / No if appropriate  List of antimicrobials: Zyvox, Merrem, Caspo,  Drains/Lines/Tubes: L PICC, PIV x1, PEG, perc x3  Foley/Indication: Yes- accurate I&Os  DVT prophylaxis/aPTT: SCDs; heparin SQ start today  Indication for gastric acid suppression: None  Type and frequency of Labs:   Daily: PT-INR, aPTT, Mg, Phos, CBC & diff, BMP  Weekly: Sed rate, CRP, CK  Every Monday: Trigycerides, prealbumin  Every Mo, Thurs: HFP  Family Concerns and Updates: Updated 5/4  Patient's preferred name (AKA): [name]  Code Status: FULL  Disposition/Level: ICU    Kari Baars, RN

## 2017-07-06 NOTE — Progress Notes (Signed)
Report Given To  Microbiologist.        Descriptive Sentence / Reason for Admission   PMHx: fibromyalgia, depression, hypothyroidism, pancreatic CA, PE Nov 2018  HPI: S/p Whipple on 1/25 c/b, mult IR procedures for drainage of abscesses, VRE bacteremia w/ mediport removal 4/8, s/p R pleural pigtail; s/p PICC placement; s/p IR PEG on 4/12 c/b posterior gastric perforation, s/p IR perc drain x2 & 4/19 CT revealed oral contrast passing into lesser sac/retroperitoneum. ID & Palliative (for pain) following  5/2: Increased O2 requirements and tachycardia (concerned for PE) on [facility]. Patient febrile to 39.6 with rising WBC. Admitted to SICU 814 with concern for sepsis. Heparin gtt started for suspicion of PE. Paused 0200 on 5/3 for new bleeding from L perc and drop in HCT 22 (28).  5/3: 2nd R perc drain placed in IR in intra-hepatic abscess (infected biloma)      Active Issues / Relevant Events   Assumed patient care at [place] on 07/05/17.  Patient lethargic, orientated to name and place.  Patient febrile, tachycardic and hypertensive.  Patient complaining of 9/10 abdominal pain, PRN Dilaudid given with minimal effect.  Patient desaturated to 88%, NC increased to 3L.  SICU notified of patients VS and pain.  500cc Plasmalyte bolus given.  X1 Dilaudid given with good effect.  Blood cultures sent.  Patient temperature increased, PR Tylenol suppository given.  PRN Ativan given for anxiety.  Patient desaturated to 88%, increased to [facility].  Patient having inspiratory wheezing, SICU ordered X1 Xopenox.  PRN Dilaudid given for pain with repositioning.  Patient slept well overnight.        To Do List  Flush Perc drains Q8hrs with 4mL NS (0400, 1200, 2000)  Clamp PEG x1hr and unclamp to gravity x4hrs (Clamp 1800-1900)  BG Q6hrs      Anticipatory Guidance / Discharge Planning  ---Critical Care Bundle---    Fluids & Electrolytes: None  Nutrition/last BM: TPN & lipids/ BM 5/4  Mobility Plan/Ability: activity as tolerated  Respiratory weaning  (PST. TC)/pulmonary toilet: 5LNC  Ventilator settings/oxygen therapy orders correct? Yes  Neuro/sedation/pain/sedation vacation: PCA with basal rate and bolus dose; Dilaudid for breakthrough PRN, Ativan PRN, lidoderm patches  Delirium: No  (based on [facility] ICU)   Sleep Hygiene: Yes / No if appropriate  List of antimicrobials: Zyvox, Merrem, Caspo,  Drains/Lines/Tubes: L PICC, PIV x1, PEG, perc x3  Foley/Indication: Yes- accurate I&Os  DVT prophylaxis/aPTT: SCDs   Indication for gastric acid suppression: None  Type and frequency of Labs:   Daily: PT-INR, aPTT, Mg, Phos, CBC & diff, BMP  Weekly: Sed rate, CRP, CK  Every Monday: Trigycerides, prealbumin  Every Mo, Thurs: HFP  Family Concerns and Updates: Updated 5/4  Patient's preferred name (AKA): Amanda Galloway  Code Status: FULL  Disposition/Level: ICU  Scheryl Darter, RN

## 2017-07-06 NOTE — Progress Notes (Signed)
Surgical Oncology/Hepatobiliary Surgery Progress Note     LOS: 99 days     Subjective:  Interval Events:     Febrile overnight, Tm 39.2.  WBC 11.3 (15.9)  Desat overnight to 80's, bumped up to 4L NC  Pan cultured, NGTD. Gram stain from liver collection drain with GPC    Objective:  Vitals Sign Ranges for Past 24 Hours:  BP: (111-170)/(55-105)   Temp:  [36.5 C (97.7 F)-39.9 C (103.9 F)]   Temp src: Temporal (05/04 0755)  Heart Rate:  [86-137]   Resp:  [18-45]   SpO2:  [85 %-99 %]     Physical Exam:  General Appearance: Resting in bed, diaphoretic, flushed  Cardiac: tachy, regular rhythm  Respiratory: non-labored breathing on 4L NC  Abdomen: Soft, non distended, G tube site c/d/i, tender surrounding tube. Perc site in hepatic abscess with bilious drainage, and inferior drain with ss drainage. Perc drain in abdominal collection with minimal milky output.  Extremities: warm, well perfused    Labs:    CBC:    Recent Labs  Lab 07/06/17  0010 07/05/17  1714 07/05/17  0148 07/05/17  0047 07/04/17  1046  07/04/17  0029 07/03/17  0032 07/02/17  0200   WBC 11.3*  --  15.9* 15.8*  --   --  16.1* 7.1 7.9   Hemoglobin 7.8* 7.1* 7.0* 6.9*  --   < > 8.7* 7.6* 7.8*   Hematocrit 25* 23* 23* 22*  --   --  28* 25* 25*   Platelets 458*  --  459* 452* 510*  --  572* 523* 515*   < > = values in this interval not displayed.    Metabolic Panel:    Recent Labs  Lab 07/06/17  0010 07/05/17  0047 07/04/17  0029 07/03/17  0032 07/02/17  0200 07/01/17  0025   Sodium 134 137 136 141 141 136   Potassium 4.0 3.2* 4.1 3.6 3.9 3.7   Chloride 96 100 98 102 102 98   CO2 _0 UN _1 22* 18 21*   Creatinine 0.47* 0.47* 0.48* 0.49* 0.51 0.49*   Glucose 131* 122* 119* 120* 121* 120*   Calcium 8.3* 8.6 9.2 9.1 8.7 9.3   Magnesium 1.5* 1.8 1.4* 1.7 1.8 1.6   Phosphorus 2.7 4.1 4.7* 4.7* 4.8* 4.8*          Assessment:  72 y.o. female with h/o recent PE and pancreatic cancer POD # 99  status post whipple procedure complicated by delayed  gastric emptying.  NGT replaced on 04/04/17. UGI on 04/12/17 concerning for obstruction just beyond the [place] in the efferent limb.  Course complicated on 09/18/94 by rising LFTs and sepsis with CT concerning for cholangitis given biliary tree obstruction and PV compression due to hematoma and afferent limb distention in setting of PE treatment. Is s/p IR PTC on 04/16/17 and IR percutaneous aspiration of anterior abdominal fluid collection on 04/24/17. IR LUQ perc drain placement 3/1. IR anterior abdominal 65f perc drain placement 3/3. IR anterior perc drain into left hepatic collection 3/6.     Plan:  - Continue NPO, TPN. Keep G-tube to gravity  - Appreciate palliative care recommendations. Multimodal pain management  - Continue IV antibiotic regimen per ID Recommendations. On broad spectrum antibiotics and antifungal coverage.  - Follow up cultures  - Monitor perc drain outputs.   - hx of PE. Continue heparin gtt.   - Dispo: Pending clinical  course. Will eventually require SNF     Cherylann Banas, MD  07/06/2017     8:09 AM   General Surgery Resident

## 2017-07-06 NOTE — Plan of Care (Signed)
Bowel Elimination     Elimination patterns are normal or improving Maintaining        Fluid and Electrolyte Imbalance     Fluid and Electrolyte imbalance Maintaining        GI Bleeding Elimination     Elimination of patterns are normal or improving Maintaining        Mobility     Functional status is maintained or improved - Geriatric Maintaining     Patient's functional status is maintained or improved Maintaining        Nutrition     Patient's nutritional status is maintained or improved Maintaining     Nutritional status is maintained or improved - Geriatric Maintaining        Pain/Comfort     Patient's pain or discomfort is manageable Maintaining     Patient's pain or discomfort is manageable Maintaining        Post-Operative Bowel Elimination     Elimination pattern is normal or improving Maintaining        Post-Operative Hemodynamic Stability     Maintain Hemodynamic Stability Maintaining        Psychosocial     Demonstrates ability to cope with illness Maintaining        Safety     Patient will remain free of falls Maintaining

## 2017-07-06 NOTE — Progress Notes (Signed)
Bedside US to check for worsening L- sided effusion -> showed  Small inadequate pocket for thoracentesis.  Will follow along with a serial bedside US

## 2017-07-07 DIAGNOSIS — G8929 Other chronic pain: Secondary | ICD-10-CM

## 2017-07-07 DIAGNOSIS — M25512 Pain in left shoulder: Secondary | ICD-10-CM

## 2017-07-07 LAB — CBC AND DIFFERENTIAL
Baso # K/uL: 0 10*3/uL (ref 0.0–0.1)
Basophil %: 0.5 %
Eos # K/uL: 0.1 10*3/uL (ref 0.0–0.4)
Eosinophil %: 1.2 %
Hematocrit: 32 % — ABNORMAL LOW (ref 34–45)
Hemoglobin: 10.3 g/dL — ABNORMAL LOW (ref 11.2–15.7)
IMM Granulocytes #: 0 10*3/uL
IMM Granulocytes: 1 %
Lymph # K/uL: 0.5 10*3/uL — ABNORMAL LOW (ref 1.2–3.7)
Lymphocyte %: 11.4 %
MCH: 28 pg/cell (ref 26–32)
MCHC: 32 g/dL (ref 32–36)
MCV: 88 fL (ref 79–95)
Mono # K/uL: 0.3 10*3/uL (ref 0.2–0.9)
Monocyte %: 6.1 %
Neut # K/uL: 3.3 10*3/uL (ref 1.6–6.1)
Nucl RBC # K/uL: 0 10*3/uL (ref 0.0–0.0)
Nucl RBC %: 0.5 /100 WBC — ABNORMAL HIGH (ref 0.0–0.2)
Platelets: 333 10*3/uL (ref 160–370)
RBC: 3.7 MIL/uL — ABNORMAL LOW (ref 3.9–5.2)
RDW: 17.2 % — ABNORMAL HIGH (ref 11.7–14.4)
Seg Neut %: 79.8 %
WBC: 4.1 10*3/uL (ref 4.0–10.0)

## 2017-07-07 LAB — BASIC METABOLIC PANEL
Anion Gap: 10 (ref 7–16)
CO2: 26 mmol/L (ref 20–28)
Calcium: 8.1 mg/dL — ABNORMAL LOW (ref 8.6–10.2)
Chloride: 97 mmol/L (ref 96–108)
Creatinine: 0.5 mg/dL — ABNORMAL LOW (ref 0.51–0.95)
GFR,Black: 113 *
GFR,Caucasian: 98 *
Glucose: 129 mg/dL — ABNORMAL HIGH (ref 60–99)
Lab: 17 mg/dL (ref 6–20)
Potassium: 3.7 mmol/L (ref 3.3–5.1)
Sodium: 133 mmol/L (ref 133–145)

## 2017-07-07 LAB — PLATELET COUNT: Platelets: 368 10*3/uL (ref 160–370)

## 2017-07-07 LAB — POCT GLUCOSE
Glucose POCT: 119 mg/dL — ABNORMAL HIGH (ref 60–99)
Glucose POCT: 124 mg/dL — ABNORMAL HIGH (ref 60–99)
Glucose POCT: 144 mg/dL — ABNORMAL HIGH (ref 60–99)
Glucose POCT: 146 mg/dL — ABNORMAL HIGH (ref 60–99)

## 2017-07-07 LAB — APTT
aPTT: 29.4 s (ref 25.8–37.9)
aPTT: 37.8 s (ref 25.8–37.9)

## 2017-07-07 LAB — PROTIME-INR
INR: 1.3 — ABNORMAL HIGH (ref 0.9–1.1)
Protime: 14.7 s — ABNORMAL HIGH (ref 10.0–12.9)

## 2017-07-07 LAB — PHOSPHORUS: Phosphorus: 2.7 mg/dL (ref 2.7–4.5)

## 2017-07-07 LAB — MAGNESIUM: Magnesium: 1.9 mg/dL (ref 1.6–2.5)

## 2017-07-07 MED ORDER — FAT EMULSION SOYBEAN OIL 20 % IV EMUL *WRAPPED*
54.0000 g | Freq: Every day | INTRAVENOUS | Status: DC
Start: 2017-07-07 — End: 2017-07-07

## 2017-07-07 MED ORDER — HEPARIN INFUSION 100 UNITS/ML (BOLUS FROM BAG) *I*
0.0000 [IU] | INTRAVENOUS | Status: DC | PRN
Start: 2017-07-07 — End: 2017-07-07

## 2017-07-07 MED ORDER — HEPARIN INFUSION 100 UNITS/ML (DVT-PE PROTOCOL) *I*
0.0000 [IU]/h | INTRAVENOUS | Status: DC
Start: 2017-07-07 — End: 2017-07-07

## 2017-07-07 MED ORDER — HEPARIN INFUSION 100 UNITS/ML (NEUR PROTOCOL) *I*
0.0000 [IU]/h | INTRAVENOUS | Status: DC
Start: 2017-07-07 — End: 2017-07-14
  Administered 2017-07-07: 1050 [IU]/h
  Administered 2017-07-07: 900 [IU]/h
  Administered 2017-07-07: 1050 [IU]/h via INTRAVENOUS
  Administered 2017-07-07: 900 [IU]/h
  Administered 2017-07-07: 1050 [IU]/h
  Administered 2017-07-07 (×3): 900 [IU]/h
  Administered 2017-07-07 (×3): 1050 [IU]/h
  Administered 2017-07-07: 900 [IU]/h via INTRAVENOUS
  Administered 2017-07-07 (×2): 900 [IU]/h
  Administered 2017-07-07: 1050 [IU]/h
  Administered 2017-07-07: 900 [IU]/h
  Administered 2017-07-07: 1050 [IU]/h
  Administered 2017-07-07 (×2): 900 [IU]/h
  Administered 2017-07-08 (×5): 1200 [IU]/h
  Administered 2017-07-08: 1200 [IU]/h via INTRAVENOUS
  Administered 2017-07-08 (×9): 1200 [IU]/h
  Administered 2017-07-08: 1200 [IU]/h via INTRAVENOUS
  Administered 2017-07-08 (×3): 1200 [IU]/h
  Administered 2017-07-09: 1350 [IU]/h
  Administered 2017-07-09: 1350 [IU]/h via INTRAVENOUS
  Administered 2017-07-09 (×9): 1350 [IU]/h
  Administered 2017-07-09: 1350 [IU]/h via INTRAVENOUS
  Administered 2017-07-09 (×3): 1350 [IU]/h
  Administered 2017-07-10 (×2): 1350 [IU]/h via INTRAVENOUS
  Administered 2017-07-10 (×11): 1350 [IU]/h
  Administered 2017-07-10: 1350 [IU]/h via INTRAVENOUS
  Administered 2017-07-11 (×6): 1350 [IU]/h
  Administered 2017-07-11 (×2): 1350 [IU]/h via INTRAVENOUS
  Administered 2017-07-11 – 2017-07-12 (×2): 1350 [IU]/h
  Administered 2017-07-12: 1350 [IU]/h via INTRAVENOUS
  Administered 2017-07-12: 1350 [IU]/h
  Administered 2017-07-12: 1350 [IU]/h via INTRAVENOUS
  Administered 2017-07-12 (×2): 1350 [IU]/h
  Administered 2017-07-12: 1350 [IU]/h via INTRAVENOUS
  Administered 2017-07-12 (×2): 1350 [IU]/h
  Administered 2017-07-12: 1350 [IU]/h via INTRAVENOUS
  Administered 2017-07-12 – 2017-07-13 (×8): 1350 [IU]/h
  Administered 2017-07-13: 1350 [IU]/h via INTRAVENOUS
  Administered 2017-07-13 – 2017-07-14 (×6): 1350 [IU]/h
  Filled 2017-07-07 (×12): qty 250

## 2017-07-07 MED ORDER — STERILE WATER FOR INJECTION (TPN USE ONLY) *I*
INTRAVENOUS | Status: DC
Start: 2017-07-07 — End: 2017-07-07

## 2017-07-07 MED ORDER — STERILE WATER FOR INJECTION (TPN USE ONLY) *I*
INTRAVENOUS | Status: AC
Start: 2017-07-07 — End: 2017-07-08
  Filled 2017-07-07: qty 660

## 2017-07-07 MED ORDER — ALTEPLASE 2 MG IJ SOLR *I*
0.5000 mg | Freq: Once | INTRAMUSCULAR | Status: AC
Start: 2017-07-07 — End: 2017-07-08
  Filled 2017-07-07: qty 2

## 2017-07-07 MED ORDER — ALTEPLASE 2 MG IJ SOLR *I*
1.0000 mg | Freq: Once | INTRAMUSCULAR | Status: AC
Start: 2017-07-07 — End: 2017-07-08
  Filled 2017-07-07: qty 2

## 2017-07-07 MED ORDER — HEPARIN INFUSION 100 UNITS/ML (BOLUS FROM BAG) *I*
80.0000 [IU]/kg | Freq: Once | INTRAVENOUS | Status: DC
Start: 2017-07-07 — End: 2017-07-07

## 2017-07-07 MED ORDER — FAT EMULSION SOY/MCT/OLIVE/FISH OIL 20 % IV EMUL *I*
54.0000 g | Freq: Every day | INTRAVENOUS | Status: AC
Start: 2017-07-07 — End: 2017-07-08
  Administered 2017-07-07: 54 g via INTRAVENOUS
  Filled 2017-07-07: qty 270

## 2017-07-07 NOTE — Progress Notes (Signed)
Critical Care Medicine Attending Note:      If there are key historical elements or objective findings that I think deserve particular emphasis, I have recorded them below. A summary of key elements of our assessment and plans is listed in the resident / medical student / PA / NP's note. Please refer to this note for complete details of our mutually agreed upon findings, assessment, and recommendations.  The problem list reflects my input.    Summary of history of present illness:  The patient is a 72 year old woman with a medical history significant for hypertension, pulmonary embolism (diagnosed in November 2018, on Lovenox at home), fibromyalgia, gastroesophageal reflux disease, hypothyroidism, and anxiety/depression. 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 She required methadone for chronic abdominal pain.  She completed neoadjuvant chemotherapy and radiation therapy and then underwent a Whipple procedure on 1/25.   She has had a prolonged, complicated postoperative course with acute respiratory failure and recurrent sepsis due to abdominal abscesses (intra-hepatic and extra-hepatic, s/p multiple percutaneous drains, she has a gastric perforation).  She was readmitted to the SICU service on 5/2 due to concerns for recurrent sepsis (increased oxygen requirements, fever, tachycardia, and leukocytosis).  She is being treated with broad spectrum antibiotics (linezolid, meropenem, and caspofungin).  She has been improving.    Interval history:  No significant new problems noted overnight.  She has no complaints.    Objective Section:  BP: (88-122)/(49-74)   Temp:  [36.2 C (97.2 F)-37.3 C (99.2 F)]   Temp src: Axillary (05/06 0739)  Heart Rate:  [81-104]   Resp:  [15-33]   SpO2:  [88 %-100 %]       O2 Flow Rate: 2 L/min (07/08/17 0820)      Intake/Output last 3 shifts:  I/O last 3 completed  shifts:  05/05 0700 - 05/06 0659  In: 3221.9 (41 mL/kg) [I.V.:269.2 (0.1 mL/kg/hr); Other:90; IV Piggyback:1310.9]  Out: 2145 (27.3 mL/kg) [Urine:1815 (1 mL/kg/hr); Emesis/NG output:250; Drains:80]  Net: 1076.9  Weight: 78.6 kg     Intake/Output this shift:  I/O this shift:  05/06 0700 - 05/06 1459  In: 515.4 (6.6 mL/kg) [I.V.:35.6; IV Piggyback:317.1]  Out: 700 (8.9 mL/kg) [Urine:600; Emesis/NG output:60; Drains:40]  Net: -184.6  Weight: 78.6 kg       Vent settings for last 24 hours:       Physical exam:      General: The patient is awake, alert, interactive, and appropriate.     Pulmonary: Symmetric breath sounds, essentially clear bilaterally.     Cardiac: Regular rate and rhythm.     Abdominal: Hypoactive bowel sounds, softly distended.        Extremities: Warm, pink, no edema.      Labs:  Lab Results   Component Value Date    WBC 7.0 07/08/2017    HCT 23 (L) 07/08/2017    PLT 389 (H) 07/08/2017     Lab Results   Component Value Date    NA 132 (L) 07/08/2017    K 3.4 07/08/2017    CL 96 07/08/2017    CO2 26 07/08/2017    UN 19 07/08/2017    CREAT 0.45 (L) 07/08/2017    WBGLU 131 (H) 07/04/2017    PGLU 136 (H) 07/08/2017     Lab Results   Component Value Date    CA 8.4 (L) 07/08/2017  MG 1.9 07/08/2017    PO4 2.4 (L) 07/08/2017     Lab Results   Component Value Date    PTI 14.0 (H) 07/08/2017    INR 1.2 (H) 07/08/2017    PTT 56.0 (H) 07/08/2017     Lab Results   Component Value Date    ALT 97 (H) 07/08/2017    AST 143 (H) 07/08/2017    ALK 437 (H) 07/08/2017     Bilirubin,Direct   Date Value Ref Range Status   07/08/2017 <0.2 0.0 - 0.3 mg/dL Final     Bilirubin,Total   Date Value Ref Range Status   07/08/2017 0.2 0.0 - 1.2 mg/dL Final           Lab results: 07/04/17  0043 06/06/17  0905  04/16/17  1718   Lactate  --  2.2  < >  --    Lactate ART,WB 1.7*  --   < >  --    Lactate VEN,WB  --   --   --  4.6*   < > = values in this interval not displayed.           Currently Active/Followed Hospital Problems:  Active  Hospital Problems    Diagnosis    *!*Pancreatic adenocarcinoma s/p Whipple 03/29/17     - Whipple on 1/25 with Dr. Ron Agee  [facility] course c/b gastric outlet obstruction d/t severe compression of the main portal vein and common bile duct by hematoma, ICU & intub (extub 2/17), mult IR procedures for drainage of abscesses, VRE bacteremia w/ mediport removal 4/8, s/p R pleural pigtail; s/p PICC after neg bld cx; s/p IR PEG on 4/12, and follow up CT & UGI w/ revealing posterior gastric perforation, s/p IR perc drain x 2 & 4/19 CT revealed oral contrast passing into lesser sac/retroperitoneum  - Continue TPN, with SSI for glucose control, Keep NPO for now. Keep G-tube to gravity  - IR drain replacement 5/3 -> 8 French drain into right intra-hepatic abscess. Communication with the adjacent bile ducts (infected biloma).   - ID following for multiple intra-abdominal fluid collections:    - s/p enterobacter cloacae bacteremia, Currently cultures + VRE, C.Glabrata and Enterobacter -> D/C Daptomycin and ertapenem ->  Started on Linezolid 600 mg IV twice daily 5/2  (in place of Daptomycin) watching carefully for serotonin syndrome given she was on methadone (d/c'd on 4/23)), Continue meropenem 1 gram IV every 8 hours and caspofungin 50 mg IV daily      Sepsis     - Afebrile, WBC downtrending  - Started on Linezolid 600 mg IV twice daily 5/2  (in place of Daptomycin) watching carefully for serotonin syndrome given she was on methadone (d/c'd on 4/23)), Continue meropenem 1 gram IV every 8 hours and caspofungin 50 mg IV daily  - Followed by ID: the change in patient status is suggestive of active infection, less likely pneumonia. It is not clear if sepsis 2/2 intraabdominal source. CT of a/p 5/1 showed collections have overall improved and liver abscess successfully drained. Persistent fluid collection in right liver lobe -> IR replaced drain 5/3   - 5/3:  BC NGTD, Drain cultures enterococcus and yeast      Acute pulmonary  insufficiency     - Intubated 2/12 prior to [facility] placement, extubated 2/17   - Weaning NC oxygen  - Chest x ray 5/2 showed Moderate bilateral pleural effusions with bibasilar opacities which may represent atelectasis or pneumonia. Superimposed pulmonary edema.  Anemia     No current indication for transfusion      Cancer associated pain     - Methadone at home, last dose was 4/21. Managed by PCP Dr Irven [place]  - Palliative following  - Currently has dilaudid PCA with cont rate  - PRN ativan, holding scheduled during critical illness      Depression     - d/c Elavil and Lexapro       hx of Pulmonary embolism     - PE 01/18/17 on CT chest. No report of previous PE   - This hospitalization has been on therapeutic Lovenox, and Heparin gtt Off. Has completed 6 months of anticoagulation   - [name] left UE 5/2 -> No evidence of venous thrombosis in the visualized deep veins left upper extremity. There is limited evaluation of the lower portion of the left brachial vein due to overlying bandages  - Neuro protocol hep gtt      Hypothyroidism     - Synthroid IV while NPO           Attending summary impressions:     The patient is doing well recovering from a sepsis episode in the setting of recurrent sepsis episodes with infected intra-abdominal fluid collections complicating a Whipple procedure for pancreatic adenocarcinoma.   The patient has acceptable hemodynamics and is clinically perfusing well.    She has been resumed on a heparin infusion for her pulmonary embolism history.   The patient is oxygenating and ventilating well on minimal supplemental oxygen.  Will continue to encourage pulmonary toilet.  The patient has mild respiratory insufficiency.    She has improving sepsis.  She has been afebrile and has a resolved leukocytosis.  She is being treated with linezolid, meropenem, and caspofungin.  ID is following   The patient has acceptable anemia and a stable hematocrit.  No transfusion is needed.    She  has required TPN for a prolonged period of time.  She has a PEG tube.  Will start low rate tube feeds.  Will continue TPN for now.   Encouraging activity.  PT is following her.   She can be transferred to [facility] PCU when a bed is available.    Attending Attestation    This is a high complexity patient.     _0  This patient is critically ill with at least 1 organ system failure including sepsis and anemia. The care I have delivered involved high complexity decision making to assess, manipulate, and support vital system function(s), to treat the vital organ system failure and/or prevent further deterioration of the patient's condition. All nursing documentation, laboratory data, test results, and radiographs were reviewed and interpreted by me. I have established the management plan for this patient's critical illness and have been immediately available to assist with patient care. My documented total critical care time reflects my own patient care time and does not include teaching or procedure time.     Critical care time: Personal time spent - (exclusive of performance of any procedures performed in the last 24 hours):  25 minutes    Signed by: Lorenza Chick, MD as of 07/08/2017 at 11:09 AM.

## 2017-07-07 NOTE — Plan of Care (Signed)
Safety     Patient will remain free of falls Maintaining          Bowel Elimination     Elimination patterns are normal or improving Progressing towards goal        Fluid and Electrolyte Imbalance     Fluid and Electrolyte imbalance Progressing towards goal        GI Bleeding Elimination     Elimination of patterns are normal or improving Progressing towards goal        Mobility     Functional status is maintained or improved - Geriatric Progressing towards goal     Patient's functional status is maintained or improved Progressing towards goal        Nutrition     Patient's nutritional status is maintained or improved Progressing towards goal     Nutritional status is maintained or improved - Geriatric Progressing towards goal        Pain/Comfort     Patient's pain or discomfort is manageable Progressing towards goal     Patient's pain or discomfort is manageable Progressing towards goal        Post-Operative Bowel Elimination     Elimination pattern is normal or improving Progressing towards goal        Post-Operative Hemodynamic Stability     Maintain Hemodynamic Stability Progressing towards goal        Psychosocial     Demonstrates ability to cope with illness Progressing towards goal

## 2017-07-07 NOTE — Progress Notes (Addendum)
Palliative Care Progress Note    Patient seen by me 07/07/2017 1058  Reason for Visit: Left sided abdominal pain; Left shoulder pain; Delirium; Anxiety; Nausea; Dry skin; Constipation; Depression; Fatigue    Subjective: Left shoulder pain, burning, constant, moderate severity, not relieved with lidocaine patch. Left-sided abdominal pain is about the same as yesterday, moderate severity.  Review of Pain Monitoring report shows increased hydromorphone IV PCA and infusion dose yesterday morning to 1mg  q73min prn with continuous 1mg /h, past 24 hours 7a-7a (16.27+18.78=) 35.05mg  total, 20 demands, 16 deliveries, and the two prior 24h periods, 31.85mg  total, and 37.48mg  total.     Palliative Care ROS:  Pain   Moderate  Nausea   None  Anxiety   None  Shortness of Breath   None    Physical Examination:   BP: (83-131)/(50-71)   Temp:  [36.2 C (97.1 F)-39 C (102.2 F)]   Temp src: Axillary (05/05 0800)  Heart Rate:  [80-111]   Resp:  [18-40]   SpO2:  [94 %-99 %]   Weight:  [78.6 kg (173 lb 4.8 oz)]   Appears comfortable sleeping. Easily arouseable, fatigued, occasional smile, cooperative, with mild-moderate discomfort.  Delayed response to questions.  Respiratory effort normal.    Assessment/Plan:  72 yo female with pancreatic cancer, Whipple procedure January 3335, prolonged complicated hospital course including now-resolved posterior gastric wall perforation, abdominal abscesses with drains, bacteremia and hypoxia, and most recently right hepatic abscess drain placed. On TPN with bowel rest, methadone has been weaned off, with analgesia now by hydromorphone IV PCA.    Left sided abdominal pain / Left shoulder pain: No change to hydromorphone IV dosing. She appears more comfortable this morning, with associated mild delirium shown as decreased concentration and delayed response to questions. Average hydromorphone IV use, 35.05mg /24h = 1.46mg /h. Continue lidocaine patch to left shoulder.    Delirium: Mild. Will hold off  increasing hydromorphone dosing for now. Monitor.    Anxiety: Continue lorazepam 0.25mg  IV prn. She is using a range of one to four doses daily.    Nausea: Ondansetron IV prn, promethazine IV prn.    Fatigue: Monitor.    Constipation: Bisacodyl suppository daily.    Dry skin to legs: Aquaphor prn.    Depressed mood: On linezolid, and presently NPO. Route limits available mood agents, and linezolid has associated risk of serotonin syndrome. Monitor.    High Complexity Medical Decision Making Summary  Problems: Established problem, stable or improving [1]x4;   High Risk [any one]: Chronic illness, with severe exacerbation or progression; acute or chronic illness or injury, which poses a threat to life or bodily function (MI, ARF, PE); abrupt change in neurological status; Parenteral controlled substances;     Author: Sharmon Leyden, MD     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    Anemia D64.9    Hypothyroidism E03.9    Pancreatic cancer C25.9    Anemia D64.9     hx of Pulmonary embolism I26.99    Acute pulmonary insufficiency J98.4    Depression F32.9    Sepsis A41.9       No Known Allergies (drug, envir, food or latex)    Scheduled Meds:    fat emulsion - soy/MCT/olive/fish oil  54 g Intravenous Daily @ 1800    meropenem  1,000 mg Intravenous  Q8H    linezolid  600 mg Intravenous Q12H    insulin lispro  0-20 Units Subcutaneous Q6H    caspofungin  50 mg Intravenous Q24H    levothyroxine IV  69 mcg Intravenous Daily    lidocaine  1 patch Transdermal Q24H    lidocaine  1 patch Transdermal Q24H    bisacodyl  10 mg Rectal Daily       Continuous Infusions:    TPN (2 in 1) Adult Continuous      heparin 900 Units/hr (07/07/17 1018)    TPN (2 in 1) Adult Continuous 55 mL/hr at 07/07/17 1020     HYDROmorphone         PRN Meds:  phenazopyridine, acetaminophen, LORazepam, HYDROmorphone PF, Nursing communication- Give 4 OZ of fruit juice for BG < 70 mg/dl **AND** dextrose **AND** dextrose **AND** glucagon **AND** POCT glucose, naloxone, mineral oil-hydrophilic petrolatum, promethazine, sodium chloride, sodium chloride, sodium chloride, dextrose, ondansetron

## 2017-07-07 NOTE — Progress Notes (Signed)
Report Given To  [facility] RN      Descriptive Sentence / Reason for Admission   PMHx: fibromyalgia, depression, hypothyroidism, pancreatic CA, PE Nov 2018  HPI: S/p Whipple on 1/25 c/b, mult IR procedures for drainage of abscesses, VRE bacteremia w/ mediport removal 4/8, s/p R pleural pigtail; s/p PICC placement; s/p IR PEG on 4/12 c/b posterior gastric perforation, s/p IR perc drain x2 & 4/19 CT revealed oral contrast passing into lesser sac/retroperitoneum. ID & Palliative (for pain) following  5/2: Increased O2 requirements and tachycardia (concerned for PE) on [facility]. Patient febrile to 39.6 with rising WBC. Admitted to SICU 814 with concern for sepsis. Heparin gtt started for suspicion of PE. Paused 0200 on 5/3 for new bleeding from L perc and drop in HCT 22 (28).  5/3: 2nd R perc drain placed in IR in intra-hepatic abscess (infected biloma)      Active Issues / Relevant Events   Assumed patient care at [place] on 07/06/17.  Patient alert and orientated times two to three.  Reinforced PCA teaching to patient.  Patient anxious, PRN Ativan given with good effect.  Patient febrile, SICU aware, PRN Tylenol given.  Patient slept well overnight.  Patient Afebrile this morning.        To Do List  Flush Perc drains Q8hrs with 72mL NS (0400, 1200, 2000)  Clamp x4h PEG and unclamp x1h  BG Q6hrs  Monitor for serotonin syndrome      Anticipatory Guidance / Discharge Planning  ---Critical Care Bundle---    Fluids & Electrolytes: None  Nutrition/last BM: TPN & lipids/ BM 5/4  Mobility Plan/Ability: activity as tolerated  Respiratory weaning (PST. TC)/pulmonary toilet: 2L NC  Ventilator settings/oxygen therapy orders correct? Yes  Neuro/sedation/pain/sedation vacation: PCA with basal rate and bolus dose; Dilaudid for breakthrough PRN, Ativan PRN, lidoderm patches  Delirium: No  (based on CAM ICU)   Sleep Hygiene: Yes / No if appropriate  List of antimicrobials: Zyvox, Merrem, Caspo,  Drains/Lines/Tubes: L PICC, PIV x1, PEG, perc  x3  Foley/Indication: Yes- accurate I&Os  DVT prophylaxis/aPTT: SCDs; heparin SQ start 5/4  Indication for gastric acid suppression: None  Type and frequency of Labs:   Daily: PT-INR, aPTT, Mg, Phos, CBC & diff, BMP  Weekly: Sed rate, CRP, CK  Every Monday: Trigycerides, prealbumin  Every Mo, Thurs: HFP  Family Concerns and Updates: Updated 5/4  Patient's preferred name (AKA): [name]  Code Status: FULL  Disposition/Level: ICU  Scheryl Darter, RN

## 2017-07-07 NOTE — Progress Notes (Signed)
SICU Progress Note    Amanda Galloway is a [name] w/ PMH fibromyalgia, pancreatic CA s/p Whipple' 8/54/62 with complicated prolonged course including ICU & intubation (extub 2/17), multiple IR procedures for drainage of abscesses, VRE bacteremia w/ mediport removal 4/8, s/p R pleural pigtail; s/p PICC after neg bld cx; s/p IR PEG on 4/12, and follow upCT &UGI w/ revealing posterior gastric perforation, s/p IR perc drain x 2 when4/19 CT revealed oral contrast passing into lesser sac/retroperitoneum. SICU was reconsulted on 5/2 for increased O2 requirements and tachycardia. Patient found to be febrile to 39.6 with rising WBC, admitted to SICU with concern for sepsis.     Interval History: Fever is fluctuating- TMax previous 24 hours 39.9 currently afebrile. Unclear at this time if dropping temp and WBC (to 4.1 today) are improvement or signs of sepsis. Normotensive. Does not look toxic in person.    Past Med/Sx History: Well outlined in previous notes    Physical Exam by Systems:  Constitutional:  awake and alert, staes she slept last night, endorse good pain control. Denies SOB, nausea  Eyes: sclera non-icteric  ENT: MM pink+moist  Cardiovascular: S1S2 RRR sinus rhythm  Respiratory: Bilateral anterior breath sounds upper lobes clear, lower lobes with crackles on deep breath  Gastrointestinal: Abd soft, appropriately tender, hypoactive bowel sounds, drains in place  Genitourinary: foley draining clear yellow  Musculoskeletal: no obvious bony abnormalities seen  Skin: Warm, well perfused, mild edema  Neurologic: MAE on request  Psychiatric: calm and cooperative    Assessment and Plan for Active/Followed Hospital Problems:   Active Hospital Problems    Diagnosis    *!*Pancreatic adenocarcinoma s/p Whipple 03/29/17     - Whipple on 1/25 with Dr. Ron Agee  [facility] course c/b gastric outlet obstruction d/t severe compression of the main portal vein and common bile duct by hematoma, ICU & intub (extub 2/17), mult IR  procedures for drainage of abscesses, VRE bacteremia w/ mediport removal 4/8, s/p R pleural pigtail; s/p PICC after neg bld cx; s/p IR PEG on 4/12, and follow up CT & UGI w/ revealing posterior gastric perforation, s/p IR perc drain x 2 & 4/19 CT revealed oral contrast passing into lesser sac/retroperitoneum  - Continue TPN, with SSI for glucose control, Keep NPO for now. Keep G-tube to gravity  - IR drain replacement 5/3 -> 8 French drain into right intra-hepatic abscess. Communication with the adjacent bile ducts (infected biloma).   - ID following for multiple intra-abdominal fluid collections:    s/p enterobacter cloacae bacteremia, Currently cultures + VRE, C.Glabrata and Enterobacter -> D/C Daptomycin and ertapenem ->  Started on Linezolid 600 mg IV twice daily 5/2  (in place of Daptomycin) watching carefully for serotonin syndrome given she was on methadone (d/c'd on 4/23)), Continue meropenem 1 gram IV every 8 hours and caspofungin 50 mg IV daily      Sepsis     -   Still feverish this morning, WBC trending down 15.8 -> 11.3  - Started on Linezolid 600 mg IV twice daily 5/2  (in place of Daptomycin) watching carefully for serotonin syndrome given she was on methadone (d/c'd on 4/23)), Continue meropenem 1 gram IV every 8 hours and caspofungin 50 mg IV daily  - Followed by ID: the change in patient status is suggestive of active infection, less likely pneumonia. It is not clear if sepsis 2/2 intraabdominal source. CT of a/p 5/1 showed collections have overall improved and liver abscess successfully drained. Persistent  fluid collection in right liver lobe -> IR replaced drain 5/3   - culture from the drain showed gram + cocci in chain, Prelim NGTD      Acute pulmonary insufficiency     - Intubated 2/12 prior to [facility] placement, extubated 2/17   - Weaning NC oxygen  - chest x ray 5/2 showed Moderate bilateral pleural effusions with bibasilar opacities which may represent atelectasis or pneumonia. Superimposed  pulmonary edema.       Anemia     No current indication for transfusion      Cancer associated pain     - Methadone at home, last dose was 4/21. Managed by PCP Dr Irven [place]  - Palliative following  - Currently has dilaudid PCA with cont rate  - PRN ativan, holding scheduled during critical illness      Depression     - d/c Elavil and Lexapro       hx of Pulmonary embolism     - PE 01/18/17 on CT chest. No report of previous PE   - This hospitalization has been on therapeutic Lovenox, and Heparin gtt Off. Has completed 6 months of anticoagulation   - [name] left UE 5/2 -> No evidence of venous thrombosis in the visualized deep veins left upper extremity. There is limited evaluation of the lower portion of the left brachial vein due to overlying bandages  - started sq heparin       Hypothyroidism     - Synthroid IV while NPO         Author: Bettey Costa, NP  as of: 07/07/2017  at: 6:54 AM

## 2017-07-07 NOTE — Progress Notes (Signed)
Report Given To  [facility] RN      Descriptive Sentence / Reason for Admission   PMHx: fibromyalgia, depression, hypothyroidism, pancreatic CA, PE Nov 2018  HPI: S/p Whipple on 1/25 c/b, mult IR procedures for drainage of abscesses, VRE bacteremia w/ mediport removal 4/8, s/p R pleural pigtail; s/p PICC placement; s/p IR PEG on 4/12 c/b posterior gastric perforation, s/p IR perc drain x2 & 4/19 CT revealed oral contrast passing into lesser sac/retroperitoneum. ID & Palliative (for pain) following  5/2: Increased O2 requirements and tachycardia (concerned for PE) on [facility]. Patient febrile to 39.6 with rising WBC. Admitted to SICU 814 with concern for sepsis. Heparin gtt started for suspicion of PE. Paused 0200 on 5/3 for new bleeding from L perc and drop in HCT 22 (28).  5/3: 2nd R perc drain placed in IR in intra-hepatic abscess (infected biloma)      Active Issues / Relevant Events   Assumed patient care 0700-1900. Patient alert and orientated times two to three.  Reinforced PCA teaching to patient. Foley taken out per order, positive post void. Pupils different sizes, L larger than R, provider aware. Heparin gtt restarted, aptt non-therapeutic. OOBTC for several hours via hovermat, tolerated well.  No other acute events. See doc flowsheets for more info.      To Do List  Flush Perc drains Q8hrs with 60mL NS (0400, 1200, 2000)  Clamp x4h PEG and unclamp x1h  BG Q6hrs  Monitor for serotonin syndrome      Anticipatory Guidance / Discharge Planning  ---Critical Care Bundle---    Fluids & Electrolytes: None  Nutrition/last BM: TPN & lipids/ BM 5/4  Mobility Plan/Ability: activity as tolerated  Respiratory weaning (PST. TC)/pulmonary toilet: 2L NC  Ventilator settings/oxygen therapy orders correct? Yes  Neuro/sedation/pain/sedation vacation: PCA with basal rate and bolus dose; Dilaudid for breakthrough PRN, Ativan PRN, lidoderm patches  Delirium: No  (based on CAM ICU)   Sleep Hygiene: Yes / No if appropriate  List of  antimicrobials: Zyvox, Merrem, Caspo,  Drains/Lines/Tubes: L PICC, PIV x1, PEG, perc x3  Foley/Indication: Yes- accurate I&Os  DVT prophylaxis/aPTT: SCDs; heparin SQ start 5/4  Indication for gastric acid suppression: None  Type and frequency of Labs:   Daily: PT-INR, aPTT, Mg, Phos, CBC & diff, BMP  Weekly: Sed rate, CRP, CK  Every Monday: Trigycerides, prealbumin  Every Mo, Thurs: HFP  Family Concerns and Updates: Updated 5/4  Patient's preferred name (AKA): [name]  Code Status: FULL  Disposition/Level: ICU  Scheryl Darter, RN

## 2017-07-07 NOTE — Interdisciplinary Rounds (Signed)
HPI: [name] w/ PMH fibromyalgia, pancreatic CA s/p Whipple for 'cure' 03/29/17 with unfortunately complicated prolonged course including ICU & intub (extub 2/17), mult IR procedures for drainage of abscesses, VRE bacteremia w/ mediport removal 4/8, s/p R pleural pigtail; s/p PICC after neg bld cx; s/p IR PEG on 4/12 w/ increased pain & FU CT & UGI w/ revealing posterior gastric perforation, s/p IR perc drain x 2 & 4/19 CT revealed oral contrast passing into lesser sac/retroperitoneum.    Foley: yes (07/04/17)  Central Line: PICC (06/13/17)  DVT prophylaxis: (SQ heparin on hold) /SCDs  Indication for gastric acid suppression: NI  Sedation: Ativan PRN  Mobility: Up ad lib  Flu Vaccine screen: Complete    07/07/17 Daily Interdisciplinary Goals of Care  Temp normalized  Restart Heparin gtt  Remove foley  Aggressive pulmonary toilet    07/06/17 Daily Interdisciplinary Goals of Care  CXR this morning, worsening pleural effusion  Tmax 39.9, Watch for Serotonin Syndrome  Start SQ Heparin, may restart Heparin gtt tomorrow  Increase PCA dose  Continue NPO    07/05/17 Daily Interdisciplinary Goals of Care  New perc drain placed in intra-hepatic abscess and fluid cultured  SCD's  Continue holding heparin; check CBC after IR     07/04/17 Daily Interdisciplinary Goals of Care  Consult ID  Reviewed ABX regimen - Vancomycin x 1   Ertapenem to Meropenem  Unable to obtain sputum culture  Heparin gtt  U/S of PICC arm for DVT

## 2017-07-07 NOTE — Progress Notes (Addendum)
Critical Care Attending Physician note    Bedside ultrasound yesterday shows only small, free flowing bilateral pleural effusions.      Blood pressure 95/56, pulse 87, temperature 36.2 C (97.1 F), temperature source Axillary, resp. rate 23, height 1.702 m (5\' 7" ), weight 78.6 kg (173 lb 4.8 oz), SpO2 95 %.      General: tearful, highly anxious  Neck: no jugular venous distention, no adenopathy  Chest: diminished at the bases.  Mildly tachypneic  Car: rrr  Abd: voluntary guarding, abd firm, ttp particularly around the drain sites.  Peg in place  Ext: There is no clubbing.  There is no cyanosis.  There is trace edema.  Neuro: anxious, tearful      SYNOPSIS OF OUR ASSESSMENT AND PLANS:    1. GI/ID  #pancreatic adenocarcinoma s/p whipple on 1/25 with Dr Ron Agee  #multiple intraabdominal fluid infections  #sepsis  - continue tpn, g-tube to gravity  - s/p new IR drain to RUQ 5/3   - continue linezolid (monitor for signs of serotonin syndrome); continue meropenem and caspofungin.  If fever curve remains elevated, may need to consider switching back to dapto later today or tomorrow   - ID following and their input is appreciated    2. Pulmonary  #concern for pneumonia  #history of pulmonary embolism  #bilateral pleural effusions  - changed from dapto to linezolid on 5/3 as above for better pulmonary coverage  - cxr 5/4 (personally reviewed) looks like worsening L-sided effusion with possible evolving right middle lobe infiltrate.     3. Heme  #anemia  #history of VTE  - resume heparin infusion    4. 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          ______________________________________________________________________  This patient was evaluated on rounds with the NP/PA.  I personally examined the patient.  All nursing documentation, laboratory data, test results, and radiographs were reviewed and interpreted by me.  I have  established the management plan for this patient's critical illness and have been immediately available to assist with patient care.  I agree with the database, findings, and plan of care recorded in the NP/PA's note.    This patient is critically ill with at least 1 organ system failure associated with a high probability of imminent or life threatening deterioration. The care I have delivered involved high complexity decision making to assess, manipulate, and support vital system function(s), to treat the vital organ system failure and/or prevent further life threatening deterioration of the patient's condition.     Critical care time: I spent 31 minutes personally attending to this patient's critical care needs. This critical care time is exclusive of any time for separately billable procedures    Earnest Bailey, MD, MSHP

## 2017-07-07 NOTE — Progress Notes (Signed)
Surgical Oncology/Hepatobiliary Surgery Progress Note     LOS: 100 days     Subjective:  Interval Events:     NAE overnight. Tm to 39 mid-day yesterday. Low grade fevers overnight.   Complaining of abdominal pain.  HR improved in 80-90 range.    Objective:  Vitals Sign Ranges for Past 24 Hours:  BP: (83-131)/(50-71)   Temp:  [36.2 C (97.1 F)-39 C (102.2 F)]   Temp src: Axillary (05/05 0800)  Heart Rate:  [80-113]   Resp:  [18-40]   SpO2:  [92 %-99 %]   Weight:  [78.6 kg (173 lb 4.8 oz)]     Physical Exam:  General Appearance: Resting in bed, in no acute distress  Cardiac: regular rhythm and rate.  Respiratory: non-labored breathing on 2L NC  Abdomen: Soft, non distended, G tube site c/d/i, tender surrounding tube. Perc site in hepatic abscess with bilious drainage, and inferior drain with ss drainage. Perc drain in abdominal collection with minimal serosanguinous output.  Extremities: warm, well perfused    Labs:    CBC:    Recent Labs  Lab 07/07/17  0013 07/06/17  0010 07/05/17  1714 07/05/17  0148 07/05/17  0047 07/04/17  1046  07/04/17  0029 07/03/17  0032   WBC 4.1 11.3*  --  15.9* 15.8*  --   --  16.1* 7.1   Hemoglobin 10.3* 7.8* 7.1* 7.0* 6.9*  --   < > 8.7* 7.6*   Hematocrit 32* 25* 23* 23* 22*  --   --  28* 25*   Platelets 333 458*  --  459* 452* 510*  --  572* 523*   < > = values in this interval not displayed.    Metabolic Panel:    Recent Labs  Lab 07/07/17  0013 07/06/17  0010 07/05/17  0047 07/04/17  0029 07/03/17  0032 07/02/17  0200   Sodium 133 134 137 136 141 141   Potassium 3.7 4.0 3.2* 4.1 3.6 3.9   Chloride 97 96 100 98 102 102   CO2 '26 25 25 24 25 24   ' UN '17 17 20 18 ' 22* 18   Creatinine 0.50* 0.47* 0.47* 0.48* 0.49* 0.51   Glucose 129* 131* 122* 119* 120* 121*   Calcium 8.1* 8.3* 8.6 9.2 9.1 8.7   Magnesium 1.9 1.5* 1.8 1.4* 1.7 1.8   Phosphorus 2.7 2.7 4.1 4.7* 4.7* 4.8*          Assessment:  72 y.o. female with h/o recent PE and pancreatic cancer POD # 100  status post whipple procedure  complicated by delayed gastric emptying.  NGT replaced on 04/04/17. UGI on 04/12/17 concerning for obstruction just beyond the [place] in the efferent limb.  Course complicated on 2/83/15 by rising LFTs and sepsis with CT concerning for cholangitis given biliary tree obstruction and PV compression due to hematoma and afferent limb distention in setting of PE treatment. Is s/p IR PTC on 04/16/17 and IR percutaneous aspiration of anterior abdominal fluid collection on 04/24/17. IR LUQ perc drain placement 3/1. IR anterior abdominal 15f perc drain placement 3/3. IR anterior perc drain into left hepatic collection 3/6.     Plan:  - Continue NPO, TPN. Keep G-tube to gravity for now. Will consider initiating TF's.  - Appreciate palliative care recommendations. Multimodal pain management  - Continue IV antibiotic regimen per ID Recommendations. On broad spectrum antibiotics and antifungal coverage.  - Follow up cultures. GPC's and yeast growing from most recent drain cultures.  -  Monitor perc drain outputs. Will discuss removing L subgastric drain tmrw.  - hx of PE. Possible plan to restart hep gtt today  - Dispo: Pending clinical course. Will eventually require SNF. Appreciate excellent SICU care    Belinda Fisher, MD  07/07/2017     9:30 AM   General Surgery Resident

## 2017-07-08 DIAGNOSIS — A4181 Sepsis due to Enterococcus: Secondary | ICD-10-CM

## 2017-07-08 LAB — PROTIME-INR
INR: 1.2 — ABNORMAL HIGH (ref 0.9–1.1)
Protime: 14 s — ABNORMAL HIGH (ref 10.0–12.9)

## 2017-07-08 LAB — CBC AND DIFFERENTIAL
Baso # K/uL: 0.1 10*3/uL (ref 0.0–0.1)
Basophil %: 1.7 %
Eos # K/uL: 0.3 10*3/uL (ref 0.0–0.4)
Eosinophil %: 4.3 %
Hematocrit: 23 % — ABNORMAL LOW (ref 34–45)
Hemoglobin: 6.9 g/dL — ABNORMAL LOW (ref 11.2–15.7)
Lymph # K/uL: 0.6 10*3/uL — ABNORMAL LOW (ref 1.2–3.7)
Lymphocyte %: 7.8 %
MCH: 28 pg/cell (ref 26–32)
MCHC: 31 g/dL — ABNORMAL LOW (ref 32–36)
MCV: 91 fL (ref 79–95)
Mono # K/uL: 0.1 10*3/uL — ABNORMAL LOW (ref 0.2–0.9)
Monocyte %: 0.9 %
Neut # K/uL: 6 10*3/uL (ref 1.6–6.1)
Nucl RBC # K/uL: 0 10*3/uL (ref 0.0–0.0)
Nucl RBC %: 0 /100 WBC (ref 0.0–0.2)
Platelets: 389 10*3/uL — ABNORMAL HIGH (ref 160–370)
RBC: 2.5 MIL/uL — ABNORMAL LOW (ref 3.9–5.2)
RDW: 17.1 % — ABNORMAL HIGH (ref 11.7–14.4)
Seg Neut %: 84.4 %
WBC: 7 10*3/uL (ref 4.0–10.0)

## 2017-07-08 LAB — HEPATIC FUNCTION PANEL
ALT: 97 U/L — ABNORMAL HIGH (ref 0–35)
AST: 143 U/L — ABNORMAL HIGH (ref 0–35)
Albumin: 2.6 g/dL — ABNORMAL LOW (ref 3.5–5.2)
Alk Phos: 437 U/L — ABNORMAL HIGH (ref 35–105)
Bilirubin,Direct: <0.2 mg/dL (ref 0.0–0.3)
Bilirubin,Total: 0.2 mg/dL (ref 0.0–1.2)
Total Protein: 6.3 g/dL (ref 6.3–7.7)

## 2017-07-08 LAB — BASIC METABOLIC PANEL
Anion Gap: 10 (ref 7–16)
CO2: 26 mmol/L (ref 20–28)
Calcium: 8.4 mg/dL — ABNORMAL LOW (ref 8.6–10.2)
Chloride: 96 mmol/L (ref 96–108)
Creatinine: 0.45 mg/dL — ABNORMAL LOW (ref 0.51–0.95)
GFR,Black: 117 *
GFR,Caucasian: 101 *
Glucose: 123 mg/dL — ABNORMAL HIGH (ref 60–99)
Lab: 19 mg/dL (ref 6–20)
Potassium: 3.4 mmol/L (ref 3.3–5.1)
Sodium: 132 mmol/L — ABNORMAL LOW (ref 133–145)

## 2017-07-08 LAB — POCT GLUCOSE
Glucose POCT: 120 mg/dL — ABNORMAL HIGH (ref 60–99)
Glucose POCT: 124 mg/dL — ABNORMAL HIGH (ref 60–99)
Glucose POCT: 133 mg/dL — ABNORMAL HIGH (ref 60–99)
Glucose POCT: 136 mg/dL — ABNORMAL HIGH (ref 60–99)

## 2017-07-08 LAB — DIFF MANUAL
Bands %: 1 % (ref 0–10)
Diff Based On: 115 CELLS

## 2017-07-08 LAB — APTT
aPTT: 48.5 s — ABNORMAL HIGH (ref 25.8–37.9)
aPTT: 56 s — ABNORMAL HIGH (ref 25.8–37.9)
aPTT: 59.3 s — ABNORMAL HIGH (ref 25.8–37.9)

## 2017-07-08 LAB — PHOSPHORUS: Phosphorus: 2.4 mg/dL — ABNORMAL LOW (ref 2.7–4.5)

## 2017-07-08 LAB — MAGNESIUM: Magnesium: 1.9 mg/dL (ref 1.6–2.5)

## 2017-07-08 LAB — TRIGLYCERIDES: Triglycerides: 245 mg/dL — AB

## 2017-07-08 LAB — PREALBUMIN: Prealbumin: 13 mg/dL — ABNORMAL LOW (ref 20–40)

## 2017-07-08 MED ORDER — SODIUM PHOSPHATES 3 MMOLE/ML IV SOLN WRAPPED *I*
15.0000 mmol | Freq: Once | INTRAVENOUS | Status: AC
Start: 2017-07-08 — End: 2017-07-08
  Administered 2017-07-08: 15 mmol via INTRAVENOUS
  Filled 2017-07-08: qty 5

## 2017-07-08 MED ORDER — STERILE WATER FOR INJECTION (TPN USE ONLY) *I*
INTRAVENOUS | Status: DC
Start: 2017-07-08 — End: 2017-07-08

## 2017-07-08 MED ORDER — FAT EMULSION SOY/MCT/OLIVE/FISH OIL 20 % IV EMUL *I*
54.0000 g | Freq: Every day | INTRAVENOUS | Status: AC
Start: 2017-07-08 — End: 2017-07-09
  Administered 2017-07-08: 54 g via INTRAVENOUS
  Filled 2017-07-08: qty 270

## 2017-07-08 MED ORDER — STERILE WATER FOR INJECTION (TPN USE ONLY) *I*
INTRAVENOUS | Status: AC
Start: 2017-07-08 — End: 2017-07-09
  Filled 2017-07-08: qty 660

## 2017-07-08 MED ORDER — INSULIN LISPRO (HUMAN) 100 UNIT/ML IJ/SC SOLN *WRAPPED*
0.0000 [IU] | Freq: Four times a day (QID) | SUBCUTANEOUS | Status: DC
Start: 2017-07-08 — End: 2017-07-12
  Administered 2017-07-11: 1 [IU] via SUBCUTANEOUS

## 2017-07-08 MED ORDER — MAGNESIUM SULFATE 2 GM IN 50 ML *WRAPPED*
2000.0000 mg | Freq: Once | INTRAVENOUS | Status: AC
Start: 2017-07-08 — End: 2017-07-08
  Administered 2017-07-08: 2000 mg via INTRAVENOUS
  Filled 2017-07-08: qty 50

## 2017-07-08 NOTE — Progress Notes (Signed)
Report Given To  [facility] RN      Descriptive Sentence / Reason for Admission   PMHx: fibromyalgia, depression, hypothyroidism, pancreatic CA, PE Nov 2018  HPI: S/p Whipple on 1/25 c/b, mult IR procedures for drainage of abscesses, VRE bacteremia w/ mediport removal 4/8, s/p R pleural pigtail; s/p PICC placement; s/p IR PEG on 4/12 c/b posterior gastric perforation, s/p IR perc drain x2 & 4/19 CT revealed oral contrast passing into lesser sac/retroperitoneum. ID & Palliative (for pain) following  5/2: Increased O2 requirements and tachycardia (concerned for PE) on [facility]. Patient febrile to 39.6 with rising WBC. Admitted to SICU 814 with concern for sepsis. Heparin gtt started for suspicion of PE. Paused 0200 on 5/3 for new bleeding from L perc and drop in HCT 22 (28).  5/3: 2nd R perc drain placed in IR in intra-hepatic abscess (infected biloma)      Active Issues / Relevant Events   Assumed pt care 0700-1900. VSSA. A&O x3 with intermittent hallucinations. OOBTC via stand and pivot with PT. Walked around unit with 2 assist, tolerated well. Trickle tube feeds started, tolerating well. No other acute events. See doc flowsheets for more info.      To Do List  Flush Perc drains Q8hrs with 102mL NS (0400, 1200, 2000)  Next APTT draw @ 0000  BG Q6hrs  Monitor for serotonin syndrome      Anticipatory Guidance / Discharge Planning  ---Critical Care Bundle---    Fluids & Electrolytes: None  Nutrition/last BM: TPN & lipids, starting TFs/ BM 5/4  Mobility Plan/Ability: activity as tolerated  Respiratory weaning (PST. TC)/pulmonary toilet: 2L NC  Ventilator settings/oxygen therapy orders correct? Yes  Neuro/sedation/pain/sedation vacation: PCA with basal rate and bolus dose; Dilaudid for breakthrough PRN, Ativan PRN, lidoderm patches  Delirium: Yes (based on CAM ICU)   Sleep Hygiene: Yes / No if appropriate  List of antimicrobials: Zyvox, Merrem, Caspo,  Drains/Lines/Tubes: L PICC, PIV x1, PEG, perc x3  Foley/Indication: No  DVT  prophylaxis/aPTT: SCDs; heparin gtt 5/5  Indication for gastric acid suppression: None  Type and frequency of Labs:   Daily: PT-INR, aPTT, Mg, Phos, CBC & diff, BMP  Weekly: Sed rate, CRP, CK  Every Monday: Trigycerides, prealbumin  Every Mo, Thurs: HFP  Family Concerns and Updates: Updated 5/5  Patient's preferred name (AKA): Amanda Galloway  Code Status: FULL  Disposition/Level: ICU-W[facility]5 step-down

## 2017-07-08 NOTE — Plan of Care (Signed)
Problem: Impaired Bed Mobility  Goal: STG - IMPROVE BED MOBILITY  Patient will perform bed mobility with rails and the head of bed up with Stand by assist of 1    Time frame: 10-14 days    Problem: Impaired Transfers  Goal: STG - IMPROVE TRANSFERS  Patient will complete Sit to stand transfers using least restrictive assistive device with Stand by assist     Time frame: 10-14 days  Goal: STG - IMPROVE TRANSFERS  Patient will complete Sit to stand transfers using least restrictive assistive device with Stand by assist of 1    Time frame: 10-14 days    Problem: Impaired Ambulation  Goal: STG - IMPROVE AMBULATION  Patient will ambulate 150-299 feet using least restrictive assistive device with Stand by assistof 1    Time frame: 10-14 days

## 2017-07-08 NOTE — Consults (Signed)
Medical Nutrition Therapy - Follow Up, TPN check    Admit Date: 03/29/2017     Patient Summary: [name] with PMH pulmonary embolism and as noted below, dx pancreatic adenocarcinoma s/p chemoRT (see Oncology notes for full hx), admitted for Whipple completed 03/29/17.  Prolonged admission with post-op course c/b delayed gastric emptying, concern for obstruction distal to the gastro-jejunostomy site, sepsis, biliary obstruction, acute respiratory failure requiring ICU stay from 2/12-2/23/19.  She is s/p multiple drain placements and IR perc aspirations of fluid collections, currently has 3 drains in place for hepatic abscess and drainage of an abdominal collection.  TPN provided 2/5-3/15.  Pt was unable to maintain adequate PO intake, required EN support; she is s/p PEG placement on 4/12.  TFs held and TPN resumed 4/19 due to abdominal pain, concern for bowel injury +/- possible disruption of GJ site.  She is on abx for VRE bacteremia, ID following.   Pt transferred to ICU 5/2 due to sepsis, concern for PNA in setting of b/l pleural effusions.  Hepatic drain replaced by IR on 5/3.  ID following.  Palliative Care [facility]) following for management of pain and depression/anxiety.     Past Medical History:   Diagnosis Date    Acute kidney failure 03/31/2017    Cancer     Depression     Fibromyalgia     Hypothyroidism      Past Surgical History:   Procedure Laterality Date    CHOLECYSTECTOMY      CHOLECYSTECTOMY, LAPAROSCOPIC  09/06/2016    HYSTERECTOMY  08/1986    Fibroids    KNEE SURGERY Right     PR INSERT TUNNELED CV CATH WITH PORT Right 10/04/2016    Procedure: Right IJ MEDIPORT Insertion;  Surgeon: Delano Metz, MD;  Location: [facility] MAIN OR;  Service: Oncology General    PR LAP,DIAGNOSTIC ABDOMEN N/A 10/04/2016    Procedure: LAPAROSCOPY DIAGNOSTIC;  Surgeon: Delano Metz, MD;  Location: [facility] MAIN OR;  Service: Oncology General    PR PART [place] PANC,PROX+REMV DUOD+ANAST N/A 03/29/2017    Procedure: WHIPPLE PROCEDURE;   Surgeon: Delano Metz, MD;  Location: [facility] MAIN OR;  Service: Oncology General    ROTATOR CUFF REPAIR Right     TONSILLECTOMY AND ADENOIDECTOMY       Pertinent Social Hx:  Divorced, resides in [place], [place].  Supportive son and daughter.     Pertinent Meds: reviewed, of note:    Scheduled Meds:   insulin lispro  0-20 Units Subcutaneous Q6H    fat emulsion - soy/MCT/olive/fish oil  54 g Intravenous Daily @ 1800    meropenem  1,000 mg Intravenous Q8H    linezolid  600 mg Intravenous Q12H    caspofungin  50 mg Intravenous Q24H    levothyroxine IV  69 mcg Intravenous Daily    lidocaine  1 patch Transdermal Q24H    lidocaine  1 patch Transdermal Q24H    bisacodyl  10 mg Rectal Daily     Continuous Infusions:   TPN (2 in 1) Adult Continuous      TPN (2 in 1) Adult Continuous 55 mL/hr at 07/08/17 0859    heparin 1,200 Units/hr (07/08/17 0859)    HYDROmorphone       PRN Meds:.phenazopyridine, acetaminophen, LORazepam, HYDROmorphone PF, Nursing communication- Give 4 OZ of fruit juice for BG < 70 mg/dl **AND** dextrose **AND** dextrose **AND** glucagon **AND** POCT glucose, naloxone, mineral oil-hydrophilic petrolatum, sodium chloride, sodium chloride, sodium chloride, dextrose, ondansetron  Pertinent Labs: reviewed    Na+ low 132, new, has been dropping over the past week    BGs range 119-136, insulin coverage and protocol in place    Estimated corrected calcium=9.68, WNL   Phos previously high, reduced in TPN, now low 2.4    AST, ALT mildly elevated, alk phos high 437, t bili WNL on 5/6   TG high 245 on 5/6   Vit D was WNL on 06/04/17.  TPN does not provide full DRI of vit D.   Total carnitine low 24, free carnitine low 20, on 2/15   Anemic, MCV WNL; anemia panel not recent.  Pt had low iron, slightly elevated ferritin levels on 2/26.      TPN provides M54 and folic acid but not iron.     CRP has been consistently high, 154 on 5/3 most recent; inflammation may be a factor in elevated ferritin levels     Pt has not received any recent RBC transfusions     Vitals: Blood pressure 98/58, pulse 88, temperature 36.2 C (97.2 F), temperature source Axillary, resp. rate 18, height 1.702 m ('5\' 7"' ), weight 78.6 kg (173 lb 4.8 oz), SpO2 97 %.    Reviewed I/O's 5/5   Good UOP   250 ml PEG output   80 ml drain output   Last BM: BM x 1 on 5/4    I/O last 3 days:  05/03 1500 - 05/06 1459  In: 8221.8 (104.6 mL/kg) [I.V.:847.8; Other:240; IV Piggyback:2876.7]  Out: 6613 (84.1 mL/kg) [Urine:5638; Emesis/NG output:760; Drains:215]  Net: 1608.8  Weight: 78.6 kg     Access:  PIV, double lumen PICC, PEG placed 4/12    Nutrition Hx:    Pt reported she was primarily only eating cottage cheese with mandarin oranges prior to admission. She would have have this 4x daily and drink Ensure (believes it was Ensure plus) ~TID. She stated she didn't eat much else for weeks. Reports that eating was "awful" after the surgery and had only been able to eat very small amounts.   NPO since 1/31-2/19; TPN started 04/09/17 (10 days after admission), continued to 3/15 with intermittent trials of PO   After stop of TPN on 3/15, pt was on TF with tray, regular diet, Osmolite 1.0; formula change to Osmolite  1.5 on 3/22; continued through 4/14 with only a few interruptions   4/15: NPO   TPN resumed 4/19 to present.  PO clears and dysphagia 1 (pureed) diets trialed 4/27-5/1, NPO resumed 5/2.  Low PO fluid intake per I&Os, 25-50% of meals accepted per RN log while on PO diet. Meals ordered on 5/1 provided 968 kcal, 28 g protein for the day.     Food allergies: NKFA    Current diet: NPO  Supplements: none    Nutrition Focused Physical Exam:  Edema: 2+ general, abdominal, 1+ BUE, BLE per shift assessment   Abdomen: distended, +BS per shift assessment  Skin: b/l elbow abrasion, skin tear abdomen, bruising, erythema, poor turgor per shift assessment    Anthropometrics:  Height: 170.2 cm ('5\' 7"' )    Weight: 78.6 kg (173 lb 4.8 oz)  Ideal Body Weight: 72.4  kg + 10%  Weight is 101.5 % of IBW  BMI (Calculated): 27.2 kg/(m^2) healthy range for age  Weight Hx: stable x 5 months PTA; severe net loss of 8.1 kg (11.1%) x 2 months between 03/11/17-05/13/17, which appears to be actual loss.  Weight up since 3/11, correlating with increased edema.  07/06/2017 78.6 kg  --with 1+ general, abdominal and 2+ BLE edema    06/27/2017 79.8 kg  Actual with 1+ general BUE and 2+ BLE edema    06/18/2017 76.9 kg  Actual with 1+ general, abdominal and 2+ BLE edema   05/28/2017 79.8 kg  Actual with 1+ general, trace BUE, 2+ BLE edema    05/13/2017 64.9 kg  With trace BLE edema, low weight of admission; BMI= 22.4    05/06/2017 65.8 kg  Actual with trace general, BUE, 1+ abdominal, BLE edema    04/17/2017 73.483 kg --with 1+ general, BUE, BLE edema    04/09/2017 73.483 kg Actual   03/29/2017 72 kg Actual admit weight with 1+ general, BLE edema    03/22/2017 71.215 kg --   03/11/2017 73 kg  --No edema per Surgical Oncology clinic note    03/06/2017 72.349 kg --   03/01/2017 73.755 kg --   02/19/2017 72.576 kg Actual   01/29/2017 73.573 kg Actual   01/28/2017 73 kg Actual   12/14/2016 73.256 kg --   11/16/2016 69.899 kg --   10/30/2016 67.586 kg Actual   10/05/2016 71.3 kg Actual     Estimated Nutrient Needs: (Based on low weight of admission, 64.9 kg; close to being a dry weight)    1625-1950 kcal/day (25-30 kcal/kg)   85-117 g protein/day (1.3-1.8 g/kg)    1625-1950 mL fluid/day (25-30 mL/kg) or per team recs     Nutrition Assessment and Diagnosis:   TPN is ordered for tonight as follows:  2-in-1 TPN with lipids via adult central line; amino acid-dextrose solution @ 55 ml/hr x 24 hrs (1320 ml fluid per day), with:   Amino acids 75 g/L  Dextrose 204 g/L   Na+ 106 meq/L   K+ 22 meq/L  Ca++ 4.5 meq/L  Mg 7 meq/L  P 3 mmole/L  Standard MVI  Standard trace elements  200 mg levocarnitine  No insulin  Chloride: Acetate ratio- max chloride   AND: 54 g/day lipids (270 ml/day 20% soy lipid emulsion, given over 12 hours)  TPN  with lipids provides a total of 1590 ml fluid, 1852 kcal, 99 g protein and 269 g dextrose per day.     Pt changed to SMOF lipids per RD recs.  She is at risk for iron and vit D deficiency due to extended period of reliance on TPN during this admission.     Pt may start trial of trickle feeds today per team notes. Pt may benefit from trial of Vital AF, a peptide based formula, higher in protein, in setting of multiple complications.     Malnutrition Status: Pt with evidence of moderate malnutrition per nutrition note of 04/09/17; pt has received PN support since 04/09/17.      Nutrition Intervention:   1. TPN Initiation  The Clinical Nutrition Specialist agrees TPN is appropriate for this patient. Most recently reviewed at [facility] on 07/03/17.  Original Start Date: 4/19  D/C Plan:  TPN will be discontinued once gut is functional, TFs or PO can be resumed     2.  For Tuesday 07/09/17: recommend 2-in-1 TPN with lipids, via adult central line; amino acid dextrose solution @ 55 ml/hr x 24 hrs (1320 ml fluid per day), with: changes in bold   Amino acids 75 g/L  Dextrose 204 g/L   Na+ 126 meq/L   K+ 22 meq/L  Ca++ 4.5 meq/L   Mg 7 meq/L  Phos 7 mmol/L   Standard MVI  Standard trace elements  ADD: 200 mg levocarnitine  Chloride: Acetate ratio- max chloride  AND: 54 g/day lipids (270 ml/day 20% SMOF lipid emulsion, given over 12 hours)  TPN with lipids will provide a total of 1590 ml fluid, 1836 kcal, 95 g protein and 269 g dextrose/day.    Over the weekend- the covering ICU RD can be contacted via the page office (day shifts) if there are any concerns regarding TPN.     3.  TF: recs:  Recommend trickle feeds of Vital AF 1.2 @ 10 ml/hr, FWF 30 ml q 4 hrs   -When able to resume PO or TFs, recommend starting 2000 international unit vit D daily.     -when ready to advance feeds, recommend diet order of TF: Vital AF 1.2 @ 10 ml/hr continuous, advance by 10 ml q 4 hrs or as tolerated (per team recs) to goal rate of 65 ml/hr  continuous, FWF 30 ml q 4 hrs. This will provide 1560 ml formula, 1872 kcal, 117 g protein and 1444 ml free fluid/day.  Adjust flushes PRN.      4.  Recommend checking current iron panel, pt may require iron repletion.  TPN does not provide iron.  Inflammation may skew (elevate) ferritin levels.      5.  Please continue parenteral monitoring panel. Recommended Monitoring Parameters:               PO4, Mg, and basic metabolic panel daily for 3 days or until stable, then 3 times per week. Replete lytes via IV as needed.                Comprehensive metabolic panel and triglycerides weekly. Hold lipids if TG>400.                Check BGs q 6 hours until stable on goal dextrose.                Monitor weight daily until fluid balance is stable, then weekly.               Accurate I&Os.       Nutrition Monitoring/Evaluation:   1. Will monitor TPN tolerance, nutrition-related labs, weight trend, BM pattern.  2. Nutrition to follow up per high nutrition risk protocol.    Serita Butcher, RD, [place] pager# [phone]

## 2017-07-08 NOTE — Progress Notes (Signed)
Physical Therapy Re-Evaluation Note:     07/08/17 1500   PT Tracking   PT TRACKING PT Assigned   Visit Number   Visit Number [facility]) / Treatment Day ([place]) 1   Visit Details [facility])   Visit Type [facility]) Follow Up  (re-evaluation)   Reason for visit [facility]) General   Precautions/Observations   Precautions used Yes   LDA Observation IV lines;O2 (comment);Drain;Monitors;PCA  (2L/min NC)   Fall Precautions General falls precautions   Pain Assessment   *Is the patient currently in pain? Yes   Pain (Before,During, After) Therapy During   0-10 Scale (not rated)   Pain Location Generalized   Pain Intervention(s) Refer to nursing for pain management   Additional comments Patient willing to continue with session; she noted stiffness in all her joints   Cognition   Arousal/Alertness Delayed responses to stimuli   Following Commands Follows simple commands with increased time;Follows simple commands with repetition   Additional Comments Redirection to task needed several times during session   Bed Mobility   Bed mobility Tested   Supine to Sit Minimum assist ;Head of bed elevated;Side rails up (#)   Additional comments Patient needing increased time and assist to maneuver each LE to edge of bed due to pain. Assist at trunk needed to transition to upright   Transfers   Transfers Tested   Sit to Stand Moderate    Stand to sit Moderate    Transfer Assistive Device rolling walker   Additional comments Trasnfer completed from edge of bed; to/from bedside chair; to/from stryker chair x3.    Mobility   Mobility Tested   Gait Pattern Decreased cadence;Trunk flexed;Decreased R step length;Decreased L step length   Ambulation Assist Contact guard;1 person assist  (with chair follow)   Ambulation Distance (Feet) 80, 40   Ambulation Assistive Device rolling walker   Additional comments Patient completed ambulation with assist to maintain balance. Cuing needed intermittently for postural correction. Patient limited by decreased endurance, generalized  pain and mantal status.  Vital signs stable   Balance   Balance Tested   Sitting - Static Standby assist   Sitting - Dynamic Contact guard   Standing - Static Contact guard   Standing - Dynamic Contact guard   PT AM-PAC Mobility   Turning over in bed? 1   Sitting down on and standing up from a chair with arms? 1   Moving from lying on back to sitting on the side of the bed? 1   Moving to and from a bed to a chair? 2   Need to walk in hospital room? 3   Climbing 3 - 5 steps with a railing? 1   Total Raw Score 9   Standardized Score 30.55   CMS 1-100% Score 81   Assessment   Brief Assessment Appropriate for skilled therapy   Problem List Impaired functional mobility   Patient / Family Goal feel better   Plan/Recommendation   Treatment Interventions Restorative PT   PT Frequency 2-4x/wk   Mobility Recommendations 1 assist transfers with RW, another assisting with line management   Discharge Recommendations (rehab)   Assessment/Recommendations Reviewed With: Patient;Nursing   Next PT Visit progress all mobility   Time Calculation   PT Timed Codes 40   PT Untimed Codes 0   PT Unbilled Time 0   PT Total Treatment 40   Plan and Onset date   Plan of Care Date 07/08/17   Onset Date 03/29/17   Treatment Start  Date 07/08/17       Jamielyn Petrucci A. Philis Nettle, PT, DPT, [place]  Pager [phone]

## 2017-07-08 NOTE — Progress Notes (Addendum)
Surgical Oncology/Hepatobiliary Surgery Progress Note     LOS: 101 days     Subjective:  Interval Events:     NAE overnight. Afebrile  Pain better controlled.   Hemodynamics stable    Objective:  Vitals Sign Ranges for Past 24 Hours:  BP: (88-122)/(51-74)   Temp:  [36.2 C (97.2 F)-37.3 C (99.2 F)]   Temp src: Axillary (05/06 0739)  Heart Rate:  [81-104]   Resp:  [15-36]   SpO2:  [87 %-100 %]     Physical Exam:  General Appearance: Resting in bed, in no acute distress  Cardiac: regular rhythm and rate.  Respiratory: non-labored breathing on 2L NC  Abdomen: Soft, non distended, G tube site c/d/i, tender surrounding tube. Perc drains x 2 in hepatic abscesses with bilious drainage; Perc drain in abdominal collection with minimal serosanguinous output; g-tube to gravity with minimal output  Extremities: warm, well perfused    Labs:    CBC:    Recent Labs  Lab 07/08/17  0018 07/07/17  1011 07/07/17  0013 07/06/17  0010 07/05/17  1714 07/05/17  0148 07/05/17  0047  07/04/17  0029   WBC 7.0  --  4.1 11.3*  --  15.9* 15.8*  --  16.1*   Hemoglobin 6.9*  --  10.3* 7.8* 7.1* 7.0* 6.9*  < > 8.7*   Hematocrit 23*  --  32* 25* 23* 23* 22*  --  28*   Platelets 389* 368 333 458*  --  459* 452*  < > 572*   < > = values in this interval not displayed.    Metabolic Panel:    Recent Labs  Lab 07/08/17  0018 07/07/17  0013 07/06/17  0010 07/05/17  0047 07/04/17  0029 07/03/17  0032   Sodium 132* 133 134 137 136 141   Potassium 3.4 3.7 4.0 3.2* 4.1 3.6   Chloride 96 97 96 100 98 102   CO2 _0 UN _1 22*   Creatinine 0.45* 0.50* 0.47* 0.47* 0.48* 0.49*   Glucose 123* 129* 131* 122* 119* 120*   Calcium 8.4* 8.1* 8.3* 8.6 9.2 9.1   Magnesium 1.9 1.9 1.5* 1.8 1.4* 1.7   Phosphorus 2.4* 2.7 2.7 4.1 4.7* 4.7*          Assessment:  72 y.o. female with h/o recent PE and pancreatic cancer POD # 101  status post whipple procedure complicated by delayed gastric emptying.  NGT replaced on 04/04/17. UGI on 04/12/17  concerning for obstruction just beyond the [place] in the efferent limb.  Course complicated on 11/01/54 by rising LFTs and sepsis with CT concerning for cholangitis given biliary tree obstruction and PV compression due to hematoma and afferent limb distention in setting of PE treatment. Is s/p IR PTC on 04/16/17 and IR percutaneous aspiration of anterior abdominal fluid collection on 04/24/17. IR LUQ perc drain placement 3/1. IR anterior abdominal 53f perc drain placement 3/3. IR anterior perc drain into left hepatic collection 3/6.     Plan:  - Continue NPO, TPN. Consider starting TF's via GT today; hold for high residuals.  - Appreciate palliative care recommendations. Multimodal pain management  - Continue IV antibiotic regimen per ID Recommendations. On broad spectrum antibiotics and antifungal coverage.  - Follow up cultures. Enterococcus and yeast growing from most recent drain cultures.  - Monitor perc drain outputs. Will d/c LUQ perc drain following initiation of TF's.  - hx of PE.  Cont hep gtt  - Dispo: Pending clinical course. Will eventually require SNF. Appreciate excellent SICU care. Transfer to [facility] PCU today    Belinda Fisher, MD  07/08/2017     8:03 AM   General Surgery Resident

## 2017-07-08 NOTE — Interdisciplinary Rounds (Signed)
HPI: [name] w/ PMH fibromyalgia, pancreatic CA s/p Whipple for 'cure' 03/29/17 with unfortunately complicated prolonged course including ICU & intub (extub 2/17), mult IR procedures for drainage of abscesses, VRE bacteremia w/ mediport removal 4/8, s/p R pleural pigtail; s/p PICC after neg bld cx; s/p IR PEG on 4/12 w/ increased pain & FU CT & UGI w/ revealing posterior gastric perforation, s/p IR perc drain x 2 & 4/19 CT revealed oral contrast passing into lesser sac/retroperitoneum.    Foley: yes (07/04/17)  Central Line: PICC (06/13/17)  DVT prophylaxis: (SQ heparin on hold) /SCDs  Indication for gastric acid suppression: NI  Sedation: Ativan PRN  Mobility: Up ad lib  Flu Vaccine screen: Complete    07/08/17 Daily Interdisciplinary Goals of Care  Started trickle feeds today  WBC trending down  Called to [facility] stepdown    07/07/17 Daily Interdisciplinary Goals of Care  Temp normalized  Restart Heparin gtt  Remove foley  Aggressive pulmonary toilet    07/06/17 Daily Interdisciplinary Goals of Care  CXR this morning, worsening pleural effusion  Tmax 39.9, Watch for Serotonin Syndrome  Start SQ Heparin, may restart Heparin gtt tomorrow  Increase PCA dose  Continue NPO    07/05/17 Daily Interdisciplinary Goals of Care  New perc drain placed in intra-hepatic abscess and fluid cultured  SCD's  Continue holding heparin; check CBC after IR     07/04/17 Daily Interdisciplinary Goals of Care  Consult ID  Reviewed ABX regimen - Vancomycin x 1   Ertapenem to Meropenem  Unable to obtain sputum culture  Heparin gtt  U/S of PICC arm for DVT

## 2017-07-08 NOTE — Progress Notes (Signed)
Palliative Care Progress Note    HPI: [name] w/ PMH fibromyalgia, pancreatic CA s/p Whipple for 'cure' 03/29/17 with unfortunately complicated prolonged course including ICU & intub (extub 2/17), mult IR procedures for drainage of abscesses, VRE bacteremia w/ mediport removal 4/8, s/p R pleural pigtail; s/p PICC after neg bld cx; s/p IR PEG on 4/12 w/ increased pain & FU CT & UGI w/ revealing posterior gastric perforation, s/p IR perc drain x 2 & 4/19 CT revealed oral contrast passing into lesser sac/retroperitoneum. GOC discussion w/ Dr Ron Agee, pt's dtr/HCP and Palliative w/ pt agreeing to continue full treatment in hope of returning eventually to independent living. Pt continues on BSA & conservative bowel rest (no meds via PEG), started TPN 4/19 & now off methadone w/ pain controlled w/ Dilaudid PCA. Pt's most recent CT abd 4/26 showed resolution of gastric perforation, pt advancing her PO diet from clears. Pt transferred to ICU on 5/2 w/ fever and hypoxia, improved w/ Vanco & abx changes, now s/p IR 5/3 for hepatic abscess drain. Trickle TFs restarted on 5/6.  Palliative is following for ongoing symptom management and support.    Subjective: "I hurt all over but mostly my stomach. It's 9/10, severe"    Objective: chart reviewed, visited w/ Pt and dtr Amanda Galloway this eve, reviewed pt's pain control, better since PCA dose increased on Sat, noted w/ more 'off statements' today per dtr, saying she wanted to 'be a cheerleader' and asking about 'Amanda Galloway, she was just here' - otherwise pt was alert and oriented to self and place, knew dtr and Probation officer; pt advocating for more pain meds, reviewed that she had just started trickle TFs today and our goal will be to resume her PO meds (ie methadone, lyrica, cymbalta) when she has shown ability to tolerate TFs; relayed recommendation to leave her PCA basal rate at 1mg /hr but will increase prn freq to Q 10 min so pt able to get addit dose if/when needed and if becomes too sleepy  won't be able to hit button)    Palliative Care ROS:   Pain   Severe  Nausea   Mild  Anorexia   Unknown  Anxiety   Moderate  Depression   Severe  Shortness of Breath   Mild  Tiredness/Fatigue   Moderate  Drowsiness/Sleepiness   Moderate  Airway Secretions   no  Constipation   no  Unable to Respond   no  Delirium   mild confusion, oriented x 3  Last stool 5/4    Physical Examination:   BP: (88-125)/(48-74)   Temp:  [36.1 C (97 F)-36.6 C (97.9 F)]   Temp src: Temporal (05/06 1939)  Heart Rate:  [81-99]   Resp:  [15-26]   SpO2:  [88 %-98 %]   Gen appearance: [name], sitting up in recliner, pt reports mod to severe abd pain & pain all over, ongoing anxiety, advocating for more pain meds, offered emotional support   Lungs: easy resp, no cough, 2 L NC  Heart: reg, tele  Abd: large, distended, +BS, trickle TFs via PEG, R side abd perc drain (scant pale green drainage) & new R side perc drain w/ red serosang drainage; L side abd perc drain (scant milky white drainage); voids on commode  Extrem: edematous LEs  Skin: warm, thin and fragile pink flaky dry skin on LEs, L arm PICC  Neuro: A+O x 3, mild confusion, baseline anxiety; offered encouragement and emotional support; pt ambulated w/ walker w/ PT today,  smiling intermittently    Assessment/Plan: [name] w/ pancreatic CA s/p Whipple in Jan w/ prolonged complicated hosp course, 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 Abd abscesses w/ drains in place, continues on TPN and started trickle TFs today. Dilaudid PCA was increased over WE, pt advocating for more pain meds, rec'd increase freq PRN on PCA w/ hope of resuming methadone in coming days via PEG.    Pain/dyspnea  S/p methadone (last IV dose was 4/21, now should be weaned out of system)  Dilaudid PCA 1 mg/hr + 1 mg IV Q 15 min prn (received total 61.7 mg from 5/5 4am to 5/6 6 am, equiv to 1234 mg po ME w/ 20:1 ratio to methadone  equiv to ~ 60 mg/d) noted increase 44% use from previous day when used total 35 mg from 5/4 6 am to 5/5 4 am   Dilaudid 1 mg IV Q 2 hrs prn available for RN to give above PCA if warranted (x 1 on 5/4)   Lidocaine patch daily x 2 (abd, LUE)  Acetaminophen 650 mg pr Q 4 hrs prn fever (x 1 on 5/5)    Anxiety/agitation/nausea/fatigue  S/p duloxetine (last dose 4/15), stopped for bowel rest   Levothyroxine 69 mcg IV daily (started IV 4/23, last TSH checked 12/14/16 was 2.87)  Ativan 0.25 mg IV Q 6 hrs prn (x 1 on 5/5, x 2 on 5/6)  Ondansetron 4 mg IV Q 6 hrs prn (x 1 on 5/1)  Encouraged complementary adjuvant therapies for coping, meditation app, aromatherapy, gentle massage     Prevention of opioid induced constipation, last stool 5/2  Bisacodyl supp pr daily     Dry flaky edematous Skin  Aquaphor prn   Eucerin prn for BLEs    GOC/prognosis  Full code  Dtr Amanda Galloway is HCP (very supportive and informed, former [facility] RN)  Goal currently is to prioritize pt's symptom control, long course though pt endorses agreement w/ continuing current treatment plan in hopes of eventually making recovery back to independent  living     Pt case discussed w/ bedside RN & notified covering provider of rec to increase PCA prn freq.    We will continue to follow along. Anticipate pt will transfer back to [facility] soon. Please call my PC colleagues if questions/concerns.    Total Time Spent 35 minutes:   >50% of time was spent in counseling and/or coordination of care.     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    Anemia D64.9    Hypothyroidism E03.9    Pancreatic cancer C25.9    Anemia D64.9     hx of Pulmonary embolism I26.99    Acute pulmonary insufficiency J98.4     Depression F32.9    Sepsis A41.9       No Known Allergies (drug, envir, food or latex)    Scheduled Meds:    insulin lispro  0-20 Units Subcutaneous Q6H    fat emulsion - soy/MCT/olive/fish oil  54 g Intravenous Daily @ 1800    meropenem  1,000 mg Intravenous Q8H    linezolid  600 mg Intravenous Q12H    caspofungin  50 mg Intravenous Q24H    levothyroxine IV  69 mcg Intravenous Daily    lidocaine  1 patch Transdermal Q24H    lidocaine  1 patch Transdermal Q24H    bisacodyl  10 mg Rectal Daily       Continuous Infusions:    TPN (2 in 1) Adult Continuous 55 mL/hr at 07/08/17 1859    heparin 1,200 Units/hr (07/08/17 1859)    HYDROmorphone         PRN Meds:  phenazopyridine, acetaminophen, LORazepam, HYDROmorphone PF, Nursing communication- Give 4 OZ of fruit juice for BG < 70 mg/dl **AND** dextrose **AND** dextrose **AND** glucagon **AND** POCT glucose, naloxone, mineral oil-hydrophilic petrolatum, sodium chloride, sodium chloride, sodium chloride, dextrose, ondansetron

## 2017-07-08 NOTE — Plan of Care (Signed)
Bowel Elimination     Elimination patterns are normal or improving Maintaining        Fluid and Electrolyte Imbalance     Fluid and Electrolyte imbalance Maintaining        GI Bleeding Elimination     Elimination of patterns are normal or improving Maintaining        Nutrition     Patient's nutritional status is maintained or improved Maintaining        Pain/Comfort     Patient's pain or discomfort is manageable Maintaining     Patient's pain or discomfort is manageable Maintaining        Post-Operative Bowel Elimination     Elimination pattern is normal or improving Maintaining        Post-Operative Hemodynamic Stability     Maintain Hemodynamic Stability Maintaining        Safety     Patient will remain free of falls Maintaining          Mobility     Functional status is maintained or improved - Geriatric Progressing towards goal     Patient's functional status is maintained or improved Progressing towards goal        Nutrition     Nutritional status is maintained or improved - Geriatric Progressing towards goal        Psychosocial     Demonstrates ability to cope with illness Progressing towards goal

## 2017-07-08 NOTE — Progress Notes (Addendum)
Infectious Diseases (Team 3) Follow Up Note    Personally reviewed chart, labs, medications. Patient seen.     CC:  F/U VRE bacteremia, Liver and abdominal abscesses, s/p Enterobacter cloacae bacteremia, Intra-abdominal abscesses positive for VRE, enterobacter and candida glabrata       Subjective: Denies N/V/D/F/C, still considerable abdominal pain particularly at PEG site.  Endorses generalized pain when moves about.  Has started on trickle feeds via PEG. She smiled today, still teary but less than previous days.  Perigastric drain with serosanguinous drainage.     ROS: Reviewed 6 systems in detail, see above    Current Meds:  Scheduled Meds:   insulin lispro  0-20 Units Subcutaneous Q6H    fat emulsion - soy/MCT/olive/fish oil  54 g Intravenous Daily @ 1800    meropenem  1,000 mg Intravenous Q8H    linezolid  600 mg Intravenous Q12H    caspofungin  50 mg Intravenous Q24H    levothyroxine IV  69 mcg Intravenous Daily    lidocaine  1 patch Transdermal Q24H    lidocaine  1 patch Transdermal Q24H    bisacodyl  10 mg Rectal Daily     Continuous Infusions:   TPN (2 in 1) Adult Continuous      TPN (2 in 1) Adult Continuous 55 mL/hr at 07/08/17 1159    heparin 1,200 Units/hr (07/08/17 1159)    HYDROmorphone       PRN Meds:.   calcium carbonate  1,000 mg Oral BID PRN    HYDROmorphone PF  0.5 mg Intravenous Q1H PRN    Or    HYDROmorphone PF  1 mg Intravenous Q1H PRN    LORazepam  0.5 mg Oral Q6H PRN    heparin lock flush  50 Units Intracatheter PRN    ondansetron  4 mg Intravenous Q6H PRN     Objective:  BP: (88-125)/(49-74)   Temp:  [36.2 C (97.2 F)-37.3 C (99.2 F)]   Temp src: Temporal (05/06 1200)  Heart Rate:  [81-103]   Resp:  [15-33]   SpO2:  [88 %-100 %]     General Appearance: Sitting in chair, more talkative, still appears quite anxious, easily cries  HEENT: Sclera anicteric, MMM  Pulm: diminished bilaterally, bases high pitched with fine crackles with deeper inspirations.   CV: RRR, S1S2,  no M/R/G, LE edema bilaterally unchanged  Abdomen:  Semi firm upper quads, soft lower quads, 2 right abd drains, 1 left abd drain, PEG tube with trickle feed infusing.   Extremities:WWP  Skin:Warm and dry, b/l LEs skin ruddy, unchanged from previous weeks exams  Neuro: Alert, oriented, answering questions appropriately. Slow, deliberate speech as baseline  Lines/Drains/Tubes: LUE PICC (4/11) benign, Left perigastic drain with serosang drainage, RUQ drains x2 both with clear bilious drainage      Recent Labs  Lab 07/08/17  0018 07/07/17  1011 07/07/17  0013 07/06/17  0010   WBC 7.0  --  4.1 11.3*   Hemoglobin 6.9*  --  10.3* 7.8*   Hematocrit 23*  --  32* 25*   Platelets 389* 368 333 458*   Seg Neut % 84.4  --  79.8 87.4   Lymphocyte % 7.8  --  11.4 3.8   Monocyte % 0.9  --  6.1 4.4   Eosinophil % 4.3  --  1.2 3.3       Recent Labs  Lab 07/08/17  0018 07/07/17  0013 07/06/17  0010   Sodium 132* 133 134  Potassium 3.4 3.7 4.0   CO2 _0 UN _1 Creatinine 0.45* 0.50* 0.47*   Glucose 123* 129* 131*   Calcium 8.4* 8.1* 8.3*         Lab results: 07/08/17  0752 07/05/17  1714 07/01/17  0025 06/27/17  0414 06/24/17  0021   Total Protein 6.3 6.3 6.6 5.5* 5.7*   Albumin 2.6* 2.4* 2.8* 2.0* 2.2*   ALT 97* 57* 59* 33 69*   AST 143* 55* 70* 29 87*   Alk Phos 437* 422* 532* 437* 540*   Bilirubin,Total 0.2 <0.2 <0.2 <0.2 <0.2       Lab Results  Component Value Date/Time   CRP 154 (H) 07/05/2017 0913   CRP 125 (H) 06/28/2017 0020   CRP 282 (H) 06/18/2017 0102       Lab Results  Component Value Date/Time   Sedimentation Rate 73 (H) 07/05/2017 0913   Sedimentation Rate 48 (H) 07/05/2017 0047   Sedimentation Rate 63 (H) 06/28/2017 0020       Lab Results  Component Value Date/Time   CK 111 07/06/2017 0010   CK 48 07/03/2017 0032   CK 48 06/26/2017 0013       Micro:   1/25: Intraoperative bile: GS 0 pmns, no organisms, Cx: no growth  2/12: 1 set of blood cultures from the right Mediport (no peripheral bld was sent):  positive for Enterobacter cloacae complex in 70 hrs, sensitive to zosyn.   2/18 abdominal GS had >25 PMNs, many GPC in pairs/chains, many GNB and cultures 4+ Enterobacter cloacae complex. The cultures also grew 1+ Candida glabrata   2/21: BCx from the Right peripheral IV, Mediport, L IJ:  No growth  05/03/17: IR-guided I&D with drain placement of the left upper quadrant collection: GS > 25 PMNs, many GNB and GN diplococci, Cultures with 4+ Enterobacter cloacae complex (resistant to Zosyn and CTX), C. Albicans and C. Glabrata, and Candida tropicalis  05/04/17:    1 set of blood cultures from left IJ: VRE at 15.3 TTP. Daptomycin sensitivities: MIC 49mg/ml (dose dependent),  Linezolid   1 set from periphery: VRE and Enterobacter cloacae at 14.8 TTP   05/05/17:  IR-guided I&D with drain placement of the intrahepatic collection: GS 1-10 PMNs, very few GPC in pairs. Fungal cultures with Candida glabrata  05/05/17: Blood cultures from periphery, Mediport and IJ: no growth  05/04/17: Urine cultures obtained (for unclear reason): no growth  05/08/17: Abscess GS: >25 PMNs, many GPC in chains, few GNB. Aerobic cultures growing 2+ Enterobacter (ertapenem, Cipro, TMP/SMX-sensitive) and 3+VRE, Fungal: C. Glabrata (sens to caspo, vori and dose dependent sens to fluconazole)  06/06/17: 2 sets of BCx (1 set each from periphery and Mediport) - taken after restarting ertapenem: IVAD grew VRE ttp 18.2 hours,  Periphery (Left arm) grew VRE ttp 17.3.  06/07/17 Pleural fluid (left) GS 1-10 PMN's, 1-10 Nucleated white cells, No organisms seen and 1604 nucleated cells. No growth on culture.   06/07/17 Abdominal abscess: labs cancelled - no specimens received.   06/08/17: Mediport BC + for VRE in 23 hrs (R-Amp, R- tetracycline per lab, suppressed result, R- doxycycline;  S-Linezolid, S-Dapto dose dependent),  Right hand peripheral cx: No growth   06/10/17: Blood culture 2/2: 1 periphery (RHand), 1 from Mediport before removed: no growth  06/11/17: Blood Culture 2/2  (both periphery) - no growth     06/19/17:  Perigastric fluid #1:  GS 10-25 PMNs -  GPB,  GNB, GPC in pairs. 1+ C. Glabrata (R- fluconazole, voriconazole, S- Caspofungin); 2+ Enterococcus faecium (R- amp, PenG, Vanco; S- Linezolid)  06/19/17:  Liver fluid collection:  GS: 0 PMNs, Fungal and AFB stains negative, 1+ enterococcus faecium (R- Amp, PenG, Vanco, S- Dapto dose dependent, Linezolid)  06/19/17 : Perigastric fluid #2: GS: >25 PMNs, Gram negative bacilli; 3+ C. Glabrata.     07/04/17 U/A negative  07/04/17 Blood Cultures 3/3 (1 set PICC proximal port, 2 sets periphery): NGTD  07/04/17 MRSA amplification Nares: negative    07/05/17 Blood Culture 3/3 (2 PICC, 1 Periph) - NGTD  07/05/17 Liver Abscess (IR drain)- <1PMNs, GPC in prs and chains, 1 colony candida albicans (preliminary), 4+ Enterococcus faecium (susceptibilities pending)    Imaging/Other Relevant Diagnostics:     07/06/17 CXR: Bilateral pleural effusions, right remains stable. Left pleural effusion is larger compared to the prior study. Bibasilar opacities, which may represent atelectasis but superimposed infection cannot be ruled out    Assessment and Plan:  ID problem(s):   - s/p enterobacter cloacae bacteremia  - Multiple and extensive Intra-abdominal abscesses growing VRE, enterobacter and candida glabrata  - VRE Bacteremia     72 y.o.femalewith h/o recent PE and pancreatic cancer, s/p whipple procedure (goal of cure), with a prolonged and complicated hospital course including delayed gastric emptying, elevated LFTs and sepsis, mild malnutrition 2/2 early satiety and anorexia (requiring PEG), and multiple intraabdominal and hepatic abscesses.  [place] was transferred to SICU on evening of 5/1 d/t respiratory decompensation, new onset leukocytosis and fever Tmax 103.2 which occurred while she was on Daptomycin (84m/kg), Ertapenem 1 gram, and Caspofungin.  She was given Vancomycin and Ertapenem was changed to meropenum by ICU given patients change in status.  Caspofungin was continued.  Team requested broadened antibiotic coverage. The change in patient status suggestive of active infection, however pneumonia remains low on differential given absence of respiratory symptoms and O2 demands quickly resolving. She has no cough and has not been able to provide a sputum for culture.   It is not clear if sepsis 2/2 intraabdominal source. All but one of intraabdominal/hepatic fluid collections improved, drain was placed in same on 07/05/17. Primary team requested  broadening antibiotics to cover possible lung infections (remains low on differential) and PsA coverage, thus patient is currently on IV linezolid, meropenem, and caspofungin.      [place] improved clinically, now afebrile with WBC down to 7.0,  O2 demands have decreased, WOB unlabored.  Most recent perc drain to liver abscess with moderately large amt clear bilious.  Cultures from liver abscess prelimin with 4+ E. faecium ( sens pending) and yeast not yet speciated,  blood cultures NGTD.  Recommend remain on current abx through finalization of cultures.     Recommendations:   Continue Linezolid 600 mg IV every 12 hours   Continue meropenem 1 gram IV every 8 hours   Continue caspofungin 50 mg IV daily   Follow twice weekly CBC with diff and CMP   Follow cultures through finalization   ID team 3 will continue to follow along    Patient seen with and plan discussed with ID attending Dr. LWilliam Hamburger     Thank you for allowing [facility] participate in the care of this patient  Please call with questions/concerns.     VLonzo Candy ANP-BC  Infectious Diseases, Team 3  Pager [phone] Office Ph [phone]     Patient seen and examined with ANP. Follow up final  culture results and temperature curve, wbc.  If cx unremarkable and continues to be stable clinically, will likely change meropenem back to ertapenem.

## 2017-07-08 NOTE — Progress Notes (Signed)
SICU Progress Note    The patient is a 72 year old woman with a medical history significant for hypertension, pulmonary embolism (diagnosed in November 2018, on Lovenox at home), fibromyalgia, gastroesophageal reflux disease, hypothyroidism, and anxiety/depression. 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 She required methadone for chronic abdominal pain.  She completed neoadjuvant chemotherapy and radiation therapy and then underwent a Whipple procedure on 1/25.  She has had a prolonged, complicated postoperative course with acute respiratory failure and sepsis due to abdominal abscesses (s/p multiple percutaneous drains, she has a gastric perforation).  She was readmitted to the SICU service on 5/2 due to concerns for recurrent sepsis (increased oxygen requirements, fever, tachycardia, and leukocytosis).  She is being treated with broad spectrum antibiotics (linezolid, meropenem, and caspofungin).    Interval History: Neuro protocol hep gtt resumed for hx of PE.  Tmax 37.3. Will start trickle TF today.     Past Med/Sx History:   Past Medical History:   Diagnosis Date    Acute kidney failure 03/31/2017    Cancer     Depression     Fibromyalgia     Hypothyroidism      Physical Exam by Systems:  Refer to attending exam.     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 Ron Agee  [facility] course c/b gastric outlet obstruction d/t severe compression of the main portal vein and common bile duct by hematoma, ICU & intub (extub 2/17), mult IR procedures for drainage of abscesses, VRE bacteremia w/ mediport removal 4/8, s/p R pleural pigtail; s/p PICC after neg bld cx; s/p IR PEG on 4/12, and follow up CT & UGI w/ revealing posterior gastric perforation, s/p IR perc  drain x 2 & 4/19 CT revealed oral contrast passing into lesser sac/retroperitoneum  - Continue TPN, with SSI for glucose control, Keep NPO for now. Keep G-tube to gravity  - IR drain replacement 5/3 -> 8 French drain into right intra-hepatic abscess. Communication with the adjacent bile ducts (infected biloma).   - ID following for multiple intra-abdominal fluid collections:    - s/p enterobacter cloacae bacteremia, Currently cultures + VRE, C.Glabrata and Enterobacter -> D/C Daptomycin and ertapenem ->  Started on Linezolid 600 mg IV twice daily 5/2  (in place of Daptomycin) watching carefully for serotonin syndrome given she was on methadone (d/c'd on 4/23)), Continue meropenem 1 gram IV every 8 hours and caspofungin 50 mg IV daily      Sepsis     - Afebrile, WBC downtrending  - Started on Linezolid 600 mg IV twice daily 5/2  (in place of Daptomycin) watching carefully for serotonin syndrome given she was on methadone (d/c'd on 4/23)), Continue meropenem 1 gram IV every 8 hours and caspofungin 50 mg IV daily  - Followed by ID: the change in patient status is suggestive of active infection, less likely pneumonia. It is not clear if sepsis 2/2 intraabdominal source. CT of a/p 5/1 showed collections have overall improved and liver abscess successfully drained. Persistent fluid collection in right liver lobe -> IR replaced drain 5/3   - 5/3:  BC NGTD, Drain cultures enterococcus and yeast      Acute pulmonary insufficiency     - Intubated  2/12 prior to [facility] placement, extubated 2/17   - Weaning NC oxygen  - Chest x ray 5/2 showed Moderate bilateral pleural effusions with bibasilar opacities which may represent atelectasis or pneumonia. Superimposed pulmonary edema.       Anemia     No current indication for transfusion      Cancer associated pain     - Methadone at home, last dose was 4/21. Managed by PCP Dr Irven [place]  - Palliative following  - Currently has dilaudid PCA with cont rate  - PRN ativan, holding  scheduled during critical illness      Depression     - d/c Elavil and Lexapro       hx of Pulmonary embolism     - PE 01/18/17 on CT chest. No report of previous PE   - This hospitalization has been on therapeutic Lovenox, and Heparin gtt Off. Has completed 6 months of anticoagulation   - [name] left UE 5/2 -> No evidence of venous thrombosis in the visualized deep veins left upper extremity. There is limited evaluation of the lower portion of the left brachial vein due to overlying bandages  - Neuro protocol hep gtt      Hypothyroidism     - Synthroid IV while NPO         Author: Marcine Matar, PA  as of: 07/08/2017  at: 9:29 AM

## 2017-07-08 NOTE — Progress Notes (Signed)
Report Given To  Kennyth Lose, RN      Descriptive Sentence / Reason for Admission   PMHx: fibromyalgia, depression, hypothyroidism, pancreatic CA, PE Nov 2018  HPI: S/p Whipple on 1/25 c/b, mult IR procedures for drainage of abscesses, VRE bacteremia w/ mediport removal 4/8, s/p R pleural pigtail; s/p PICC placement; s/p IR PEG on 4/12 c/b posterior gastric perforation, s/p IR perc drain x2 & 4/19 CT revealed oral contrast passing into lesser sac/retroperitoneum. ID & Palliative (for pain) following  5/2: Increased O2 requirements and tachycardia (concerned for PE) on [facility]. Patient febrile to 39.6 with rising WBC. Admitted to SICU 814 with concern for sepsis. Heparin gtt started for suspicion of PE. Paused 0200 on 5/3 for new bleeding from L perc and drop in HCT 22 (28).  5/3: 2nd R perc drain placed in IR in intra-hepatic abscess (infected biloma)      Active Issues / Relevant Events   Assumed care 1900-0700. A&Ox3, following simple commands but anxious and tearful at times. PRN ativan given x2 for anxiety with moderate effect. Mag repleted x1. Sodium Phos repleted x1. For full assessment, VS and I&Os see eMR.   Luciano Cutter, RN        To Do List  Flush Perc drains Q8hrs with 33mL NS (0400, 1200, 2000)  Next APTT draw @ 0730  Clamp x4h PEG and unclamp x1h- Clamp at 0800  BG Q6hrs  Monitor for serotonin syndrome      Anticipatory Guidance / Discharge Planning  ---Critical Care Bundle---    Fluids & Electrolytes: None  Nutrition/last BM: TPN & lipids/ BM 5/4  Mobility Plan/Ability: activity as tolerated  Respiratory weaning (PST. TC)/pulmonary toilet: 2L NC  Ventilator settings/oxygen therapy orders correct? Yes  Neuro/sedation/pain/sedation vacation: PCA with basal rate and bolus dose; Dilaudid for breakthrough PRN, Ativan PRN, lidoderm patches  Delirium: No  (based on CAM ICU)   Sleep Hygiene: Yes / No if appropriate  List of antimicrobials: Zyvox, Merrem, Caspo,  Drains/Lines/Tubes: L PICC, PIV x1, PEG, perc  x3  Foley/Indication: Yes- accurate I&Os  DVT prophylaxis/aPTT: SCDs; heparin SQ start 5/4  Indication for gastric acid suppression: None  Type and frequency of Labs:   Daily: PT-INR, aPTT, Mg, Phos, CBC & diff, BMP  Weekly: Sed rate, CRP, CK  Every Monday: Trigycerides, prealbumin  Every Mo, Thurs: HFP  Family Concerns and Updates: Updated 5/4  Patient's preferred name (AKA): Amanda Galloway  Code Status: FULL  Disposition/Level: ICU  Scheryl Darter, RN

## 2017-07-08 NOTE — Plan of Care (Signed)
Bowel Elimination     Elimination patterns are normal or improving Maintaining        Fluid and Electrolyte Imbalance     Fluid and Electrolyte imbalance Maintaining        GI Bleeding Elimination     Elimination of patterns are normal or improving Maintaining        Mobility     Functional status is maintained or improved - Geriatric Maintaining     Patient's functional status is maintained or improved Maintaining        Nutrition     Patient's nutritional status is maintained or improved Maintaining     Nutritional status is maintained or improved - Geriatric Maintaining        Pain/Comfort     Patient's pain or discomfort is manageable Maintaining     Patient's pain or discomfort is manageable Maintaining        Post-Operative Bowel Elimination     Elimination pattern is normal or improving Maintaining        Psychosocial     Demonstrates ability to cope with illness Maintaining          Post-Operative Hemodynamic Stability     Maintain Hemodynamic Stability Progressing towards goal        Safety     Patient will remain free of falls Progressing towards goal

## 2017-07-09 LAB — APTT
aPTT: 46.5 s — ABNORMAL HIGH (ref 25.8–37.9)
aPTT: 55.4 s — ABNORMAL HIGH (ref 25.8–37.9)
aPTT: 61.6 s — ABNORMAL HIGH (ref 25.8–37.9)

## 2017-07-09 LAB — CBC AND DIFFERENTIAL
Baso # K/uL: 0.1 10*3/uL (ref 0.0–0.1)
Basophil %: 0.9 %
Eos # K/uL: 0.7 10*3/uL — ABNORMAL HIGH (ref 0.0–0.4)
Eosinophil %: 8.9 %
Hematocrit: 23 % — ABNORMAL LOW (ref 34–45)
Hemoglobin: 7 g/dL — ABNORMAL LOW (ref 11.2–15.7)
IMM Granulocytes #: 0.1 10*3/uL
IMM Granulocytes: 1.5 %
Lymph # K/uL: 1.1 10*3/uL — ABNORMAL LOW (ref 1.2–3.7)
Lymphocyte %: 14.6 %
MCH: 28 pg/cell (ref 26–32)
MCHC: 31 g/dL — ABNORMAL LOW (ref 32–36)
MCV: 90 fL (ref 79–95)
Mono # K/uL: 0.8 10*3/uL (ref 0.2–0.9)
Monocyte %: 9.9 %
Neut # K/uL: 4.8 10*3/uL (ref 1.6–6.1)
Nucl RBC # K/uL: 0 10*3/uL (ref 0.0–0.0)
Nucl RBC %: 0 /100 WBC (ref 0.0–0.2)
Platelets: 411 10*3/uL — ABNORMAL HIGH (ref 160–370)
RBC: 2.5 MIL/uL — ABNORMAL LOW (ref 3.9–5.2)
RDW: 17.1 % — ABNORMAL HIGH (ref 11.7–14.4)
Seg Neut %: 64.2 %
WBC: 7.5 10*3/uL (ref 4.0–10.0)

## 2017-07-09 LAB — BASIC METABOLIC PANEL
Anion Gap: 11 (ref 7–16)
CO2: 25 mmol/L (ref 20–28)
Calcium: 8.6 mg/dL (ref 8.6–10.2)
Chloride: 98 mmol/L (ref 96–108)
Creatinine: 0.41 mg/dL — ABNORMAL LOW (ref 0.51–0.95)
GFR,Black: 120 *
GFR,Caucasian: 104 *
Glucose: 116 mg/dL — ABNORMAL HIGH (ref 60–99)
Lab: 21 mg/dL — ABNORMAL HIGH (ref 6–20)
Potassium: 3.5 mmol/L (ref 3.3–5.1)
Sodium: 134 mmol/L (ref 133–145)

## 2017-07-09 LAB — POCT GLUCOSE
Glucose POCT: 116 mg/dL — ABNORMAL HIGH (ref 60–99)
Glucose POCT: 119 mg/dL — ABNORMAL HIGH (ref 60–99)
Glucose POCT: 122 mg/dL — ABNORMAL HIGH (ref 60–99)
Glucose POCT: 98 mg/dL (ref 60–99)

## 2017-07-09 LAB — PROTIME-INR
INR: 1.2 — ABNORMAL HIGH (ref 0.9–1.1)
Protime: 13.4 s — ABNORMAL HIGH (ref 10.0–12.9)

## 2017-07-09 LAB — PHOSPHORUS: Phosphorus: 2.7 mg/dL (ref 2.7–4.5)

## 2017-07-09 LAB — BLOOD CULTURE
Bacterial Blood Culture: 0
Bacterial Blood Culture: 0
Bacterial Blood Culture: 0
Bacterial Blood Culture: 0

## 2017-07-09 LAB — MAGNESIUM: Magnesium: 2.1 mg/dL (ref 1.6–2.5)

## 2017-07-09 MED ORDER — STERILE WATER FOR INJECTION (TPN USE ONLY) *I*
INTRAVENOUS | Status: AC
Start: 2017-07-09 — End: 2017-07-10
  Filled 2017-07-09: qty 660

## 2017-07-09 MED ORDER — LEVOTHYROXINE SODIUM 25 MCG PO TABS *I*
137.0000 ug | ORAL_TABLET | Freq: Every day | ORAL | Status: DC
Start: 2017-07-09 — End: 2017-07-09

## 2017-07-09 MED ORDER — DULOXETINE HCL 20 MG PO CPEP *I*
20.0000 mg | DELAYED_RELEASE_CAPSULE | ORAL | Status: AC
Start: 2017-07-09 — End: 2017-08-19
  Administered 2017-07-09 – 2017-08-18 (×38): 20 mg via ORAL
  Filled 2017-07-09 (×41): qty 1

## 2017-07-09 MED ORDER — LEVOTHYROXINE SODIUM 25 MCG PO TABS *I*
137.0000 ug | ORAL_TABLET | Freq: Every day | ORAL | Status: DC
Start: 2017-07-10 — End: 2017-07-24
  Administered 2017-07-10 – 2017-07-24 (×15): 137 ug via ORAL
  Filled 2017-07-09 (×16): qty 1

## 2017-07-09 MED ORDER — BISACODYL 10 MG RE SUPP *I*
10.0000 mg | Freq: Every day | RECTAL | Status: DC
Start: 2017-07-09 — End: 2017-08-20
  Administered 2017-07-09 – 2017-08-17 (×3): 10 mg via RECTAL

## 2017-07-09 MED ORDER — METHADONE HCL 5 MG PO TABS *I*
7.5000 mg | ORAL_TABLET | Freq: Three times a day (TID) | ORAL | Status: DC
Start: 2017-07-09 — End: 2017-07-15
  Administered 2017-07-09 – 2017-07-15 (×18): 7.5 mg via ORAL
  Filled 2017-07-09 (×18): qty 2

## 2017-07-09 NOTE — Progress Notes (Signed)
Report Given To  [facility] RN      Descriptive Sentence / Reason for Admission   PMHx: fibromyalgia, depression, hypothyroidism, pancreatic CA, PE Nov 2018  HPI: S/p Whipple on 1/25 c/b, mult IR procedures for drainage of abscesses, VRE bacteremia w/ mediport removal 4/8, s/p R pleural pigtail; s/p PICC placement; s/p IR PEG on 4/12 c/b posterior gastric perforation, s/p IR perc drain x2 & 4/19 CT revealed oral contrast passing into lesser sac/retroperitoneum. ID & Palliative (for pain) following  5/2: Increased O2 requirements and tachycardia (concerned for PE) on [facility]. Patient febrile to 39.6 with rising WBC. Admitted to SICU 814 with concern for sepsis. Heparin gtt started for suspicion of PE. Paused 0200 on 5/3 for new bleeding from L perc and drop in HCT 22 (28).  5/3: 2nd R perc drain placed in IR in intra-hepatic abscess (infected biloma)      Active Issues / Relevant Events   Assumed pt care [id number]. VSSA. OOBTC with PT. Ambulated around unit with walker and one assist. Diet advanced to full liquids. No other acute events. Transferred to [facility].      To Do List  Increase TF to 40 at 0000 5/8  Flush Perc drains Q8hrs with 31mL NS (0400, 1200, 2000)  Next APTT draw 1915 5/7  BG Q6hrs  Monitor for serotonin syndrome      Anticipatory Guidance / Discharge Planning  ---Critical Care Bundle---    Fluids & Electrolytes: None  Nutrition/last BM: TPN & lipids, starting TFs @ 20 (65 goal)/ BM 5/4  Mobility Plan/Ability: activity as tolerated  Respiratory weaning (PST. TC)/pulmonary toilet: RA  Ventilator settings/oxygen therapy orders correct? Yes  Neuro/sedation/pain/sedation vacation: PCA with basal rate and bolus dose; Dilaudid for breakthrough PRN, Ativan PRN, lidoderm patches  Delirium: Yes (based on CAM ICU)   Sleep Hygiene: Yes / No if appropriate  List of antimicrobials: Zyvox, Merrem, Caspo,  Drains/Lines/Tubes: L PICC, PIV x1, PEG, perc x3  Foley/Indication: No  DVT prophylaxis/aPTT: SCDs; heparin gtt started  5/5  Indication for gastric acid suppression: None  Type and frequency of Labs:   Daily: PT-INR, aPTT, Mg, Phos, CBC & diff, BMP  Weekly: Sed rate, CRP, CK  Every Monday: Trigycerides, prealbumin  Every Mo, Thurs: HFP  Family Concerns and Updates: Updated 5/6  Patient's preferred name (AKA): [name]  Code Status: FULL  Disposition/Level: ICU-WCC5 step-down

## 2017-07-09 NOTE — Progress Notes (Signed)
Patient ready for transfer to [facility] 5. HPB team aware of plan and bed ready on the unit. Report given to Jeanene Erb, NP. Patient safe for transfer.     Sheryn Bison, MD

## 2017-07-09 NOTE — Consults (Signed)
Medical Nutrition Therapy - Follow Up, TPN check    Admit Date: 03/29/2017     Patient Summary: [name] with PMH pulmonary embolism and as noted below, dx pancreatic adenocarcinoma s/p chemoRT (see Oncology notes for full hx), admitted for Whipple completed 03/29/17.  Prolonged admission with post-op course c/b delayed gastric emptying, concern for obstruction distal to the gastro-jejunostomy site, sepsis, biliary obstruction, acute respiratory failure requiring ICU stay from 2/12-2/23/19.  She is s/p multiple drain placements and IR perc aspirations of fluid collections, currently has 3 drains in place for hepatic abscess and drainage of an abdominal collection.  TPN provided 2/5-3/15.  Pt was unable to maintain adequate PO intake, required EN support; she is s/p PEG placement on 4/12.  TFs held and TPN resumed 4/19 due to abdominal pain, concern for bowel injury +/- possible disruption of GJ site.  She is on abx for VRE bacteremia, ID following.   Pt transferred to ICU 5/2 due to sepsis, concern for PNA in setting of b/l pleural effusions.  Hepatic drain replaced by IR on 5/3.  ID following.  Palliative Care [facility]) following for management of pain and depression/anxiety.  Trickle TFs resumed on 5/6 with slow rate of advance ordered.     Past Medical History:   Diagnosis Date    Acute kidney failure 03/31/2017    Cancer     Depression     Fibromyalgia     Hypothyroidism      Past Surgical History:   Procedure Laterality Date    CHOLECYSTECTOMY      CHOLECYSTECTOMY, LAPAROSCOPIC  09/06/2016    HYSTERECTOMY  08/1986    Fibroids    KNEE SURGERY Right     PR INSERT TUNNELED CV CATH WITH PORT Right 10/04/2016    Procedure: Right IJ MEDIPORT Insertion;  Surgeon: Delano Metz, MD;  Location: [facility] MAIN OR;  Service: Oncology General    PR LAP,DIAGNOSTIC ABDOMEN N/A 10/04/2016    Procedure: LAPAROSCOPY DIAGNOSTIC;  Surgeon: Delano Metz, MD;  Location: [facility] MAIN OR;  Service: Oncology General    PR PART [place] PANC,PROX+REMV  DUOD+ANAST N/A 03/29/2017    Procedure: WHIPPLE PROCEDURE;  Surgeon: Delano Metz, MD;  Location: [facility] MAIN OR;  Service: Oncology General    ROTATOR CUFF REPAIR Right     TONSILLECTOMY AND ADENOIDECTOMY       Pertinent Social Hx:  Divorced, resides in [place], [place].  Supportive son and daughter. Daughter is an Therapist, sports.     Pertinent Meds: reviewed, of note:    Scheduled Meds:   bisacodyl  10 mg Rectal Daily    insulin lispro  0-20 Units Subcutaneous Q6H    meropenem  1,000 mg Intravenous Q8H    linezolid  600 mg Intravenous Q12H    caspofungin  50 mg Intravenous Q24H    levothyroxine IV  69 mcg Intravenous Daily    lidocaine  1 patch Transdermal Q24H    lidocaine  1 patch Transdermal Q24H    bisacodyl  10 mg Rectal Daily     Continuous Infusions:   TPN (2 in 1) Adult Continuous 55 mL/hr at 07/09/17 0759    heparin 1,350 Units/hr (07/09/17 0759)    HYDROmorphone       PRN Meds:.phenazopyridine, acetaminophen, LORazepam, HYDROmorphone PF, Nursing communication- Give 4 OZ of fruit juice for BG < 70 mg/dl **AND** dextrose **AND** dextrose **AND** glucagon **AND** POCT glucose, naloxone, mineral oil-hydrophilic petrolatum, sodium chloride, sodium chloride, sodium chloride, dextrose, ondansetron  Pertinent Labs: reviewed    BGs range 116-122, insulin coverage and protocol in place    UN slightly high, creatinine low, GFR WNL   Na+ and P WNL s/p repletion with IV NaPhos on 5/6   AST, ALT mildly elevated, alk phos high 437, t bili WNL on 5/6   TG high 245 on 5/6   Vit D was WNL on 06/04/17.  TPN does not provide full DRI of vit D.   Total carnitine low 24, free carnitine low 20, on 2/15   Anemic, MCV WNL; anemia panel not recent.  Pt had low iron, slightly elevated ferritin levels on 2/26.      TPN provides N23 and folic acid but not iron.     CRP has been consistently high, 154 on 5/3 most recent; inflammation may be a factor in elevated ferritin levels    Pt has not received any recent RBC transfusions      Vitals: Blood pressure 117/56, pulse 86, temperature 36.4 C (97.5 F), temperature source Temporal, resp. rate 23, height 1.702 m (_0 ), weight 78.6 kg (173 lb 4.8 oz), SpO2 97 %.    Reviewed I/O's 5/6   Good UOP   60 ml PEG output   250 ml drain output   Last BM: BM x 1 on 5/4    I/O last 3 days:  05/04 1500 - 05/07 1459  In: 8494.4 (108.1 mL/kg) [I.V.:661; Other:240; NG/GT:460; IV Piggyback:2827.1]  Out: 5335 (67.9 mL/kg) [FTDDU:2025; Emesis/NG output:510; Drains:360]  Net: 3159.4  Weight: 78.6 kg     Access:  PIV, double lumen PICC, PEG placed 4/12    Nutrition Hx:    Pt reported she was primarily only eating cottage cheese with mandarin oranges prior to admission. She would have have this 4x daily and drink Ensure (believes it was Ensure plus) ~TID. She stated she didn't eat much else for weeks. Reports that eating was "awful" after the surgery and had only been able to eat very small amounts.   NPO since 1/31-2/19; TPN started 04/09/17 (10 days after admission), continued to 3/15 with intermittent trials of PO   After stop of TPN on 3/15, pt was on TF with tray, regular diet, Osmolite 1.0; formula change to Osmolite  1.5 on 3/22; continued through 4/14 with only a few interruptions   4/15: NPO   TPN resumed 4/19 to present.  PO clears and dysphagia 1 (pureed) diets trialed 4/27-5/1, NPO resumed 5/2.  Low PO fluid intake per I&Os, 25-50% of meals accepted per RN log while on PO diet. Meals ordered on 5/1 provided 968 kcal, 28 g protein for the day.    TFs resumed with Vital AF 1.2 on 5/6    Food allergies: NKFA    Current diet: TF: Vital AF 1.2 @ 10 ml/hr continuous, advance by 10 ml q 12 hrs to goal rate of 65 ml/hr continuous, FWF 30 ml q 4 hrs   Supplements: none    Nutrition Focused Physical Exam:  Edema: 2+ general, 1+ abdominal, BUE, 1-2+ BLE per shift assessment   Abdomen: distended, +BS per shift assessment  Skin: b/l elbow abrasions, skin tear abdomen, bruising, erythema, poor turgor per  shift assessment    Anthropometrics:  Height: 170.2 cm (_1 )    Weight: 78.6 kg (173 lb 4.8 oz)  Ideal Body Weight: 72.4 kg + 10%  Weight is 108.6 % of IBW, with edema  BMI (Calculated): 27.2 kg/(m^2) borderline overweight range for age, with edema  Weight Hx: stable x 5 months PTA; severe net loss of 8.1 kg (11.1%) x 2 months between 03/11/17-05/13/17, which appears to be actual loss.  Weight up since 3/11, correlating with increased edema.  07/06/2017 78.6 kg  --with 1+ general, abdominal and 2+ BLE edema    06/27/2017 79.8 kg  Actual with 1+ general BUE and 2+ BLE edema    06/18/2017 76.9 kg  Actual with 1+ general, abdominal and 2+ BLE edema   05/28/2017 79.8 kg  Actual with 1+ general, trace BUE, 2+ BLE edema    05/13/2017 64.9 kg  With trace BLE edema, low weight of admission; BMI= 22.4    05/06/2017 65.8 kg  Actual with trace general, BUE, 1+ abdominal, BLE edema    04/17/2017 73.483 kg --with 1+ general, BUE, BLE edema    04/09/2017 73.483 kg Actual   03/29/2017 72 kg Actual admit weight with 1+ general, BLE edema    03/22/2017 71.215 kg --   03/11/2017 73 kg  --No edema per Surgical Oncology clinic note    03/06/2017 72.349 kg --   03/01/2017 73.755 kg --   02/19/2017 72.576 kg Actual   01/29/2017 73.573 kg Actual   01/28/2017 73 kg Actual   12/14/2016 73.256 kg --   11/16/2016 69.899 kg --   10/30/2016 67.586 kg Actual   10/05/2016 71.3 kg Actual     Estimated Nutrient Needs: (Based on low weight of admission, 64.9 kg; close to being a dry weight)   1625-1950 kcal/day (25-30 kcal/kg)   85-117 g protein/day (1.3-1.8 g/kg)    1625-1950 mL fluid/day (25-30 mL/kg) or per team recs     Nutrition Assessment and Diagnosis:   TPN is ordered for last night as follows:  2-in-1 TPN with lipids via adult central line; amino acid-dextrose solution @ 55 ml/hr x 24 hrs (1320 ml fluid per day), with:   Amino acids 75 g/L  Dextrose 204 g/L   Na+ 126 meq/L   K+ 22 meq/L  Ca++ 4.5 meq/L  Mg 7 meq/L  P 7 mmole/L  Standard MVI  Standard trace  elements  200 mg levocarnitine  No insulin  Chloride: Acetate ratio- max chloride   AND: 54 g/day lipids (270 ml/day 20% soy lipid emulsion, given over 12 hours)  TPN with lipids provided a total of 1590 ml fluid, 1852 kcal, 99 g protein and 269 g dextrose per day.     Pt changed to SMOF lipids per RD recs.  She is at risk for iron and vit D deficiency due to extended period of reliance on TPN during this admission.     Trickle feeds of Vital AF 1.2 started on 5/6 with slow rate of advance.  Pt continues to have issues with pain management (hx fibromyalgia noted), all over but particularly c/o stomach pain; Palliative Care is following.  Residuals have been minimal per the comprehensive flowsheet.  She has not had a BM since 5/4 per I&Os.     Current TF order at goal with flushes provides a total of 1560 ml formula, 1872 kcal, 117 g protein and 1444 ml free fluid per day.  Current rate of 20 ml/hr provides 576 kcal per day, adequate to meet 30% of her estimated kcal needs.  OK to d/c lipids.     Malnutrition Status: Pt with evidence of moderate malnutrition per nutrition note of 04/09/17; pt has received PN support since 04/09/17.      Nutrition Intervention:   1. TPN Initiation  The Clinical Nutrition Specialist agrees TPN is appropriate for this patient. Most recently reviewed at [place] on 07/03/17.  Original Start Date: 4/19  D/C Plan:  TPN will be discontinued once gut is functional, TFs or PO tolerated    2.  For Tuesday 07/09/17: recommend 2-in-1 TPN without lipids, via adult central line; amino acid dextrose solution @ 55 ml/hr x 24 hrs (1320 ml fluid per day), with: changes in bold   Amino acids 75 g/L  Dextrose 204 g/L   Na+ 126 meq/L   K+ 22 meq/L  Ca++ 4.5 meq/L   Mg 7 meq/L  Phos 7 mmol/L   Standard MVI  Standard trace elements  ADD: 200 mg levocarnitine  Chloride: Acetate ratio- max chloride  TPN without lipids will provide a total of 1590 ml fluid, 1296 kcal, 95 g protein and 269 g dextrose/day.  TFs at  current rate adequate to meet balance of calorie needs.      3.  Continue diet order of TF: Vital AF 1.2 @ 10 ml/hr continuous, advance by 10 ml q 12 hrsto goal rate of 65 ml/hr continuous, FWF 30 ml q 4 hrs.    -At goal, provides 1560 ml formula, 1872 kcal, 117 g protein and 1444 ml free fluid/day.  Adjust flushes PRN.   -TPN weaning: when tolerating TFs at >=45 ml/hr continuous, can run TPN at half rate x 1 hr (to prevent rebound hypoglycemia) and then d/c   -When able to resume PO meds, recommend starting 2000 international unit vit D daily and eval for need for iron supplement.  TPN does not provide iron.  Inflammation may skew (elevate) ferritin levels.      4.  Please continue parenteral monitoring panel. Recommended Monitoring Parameters:               PO4, Mg, and basic metabolic panel daily for 3 days or until stable, then 3 times per week. Replete lytes via IV as needed.                Comprehensive metabolic panel and triglycerides weekly. Hold lipids if TG>400.                Check BGs q 6 hours until stable on goal dextrose.                Monitor weight daily until fluid balance is stable, then weekly.               Accurate I&Os.         Nutrition Monitoring/Evaluation:   1. Will monitor TPN tolerance, nutrition-related labs, weight trend, BM pattern.  2. Nutrition to follow up per high nutrition risk protocol.    Serita Butcher, RD, [place] pager# [phone]

## 2017-07-09 NOTE — Progress Notes (Signed)
Patient was transferred from [address] at 18:00 via wheelchair. She is AVSS, A and O x 3. She has TF infusing via PEG - Vital AF at 30 cc/hr - scheduled to be increased at midnight to 40 cc/hr. She also has TPN infusing via PICC at 55cc/hr. In addition, she is taking in small amounts of a full liquid diet. She has been able to take pills orally with applesauce. She is up with 1 assist. She is voiding on commode. She has a heparin gtt infusing at 13.5 cc/hr and PTT is therapeutic - next check will be with AM labs at midnight. 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 She is reoriented to unit, room, staff and call bell. Daughters have been at bedside and are very supportive. Will continue to monitor and will continue with current plan of care.

## 2017-07-09 NOTE — Progress Notes (Signed)
Report Given To  Kennyth Lose, RN      Descriptive Sentence / Reason for Admission   PMHx: fibromyalgia, depression, hypothyroidism, pancreatic CA, PE Nov 2018  HPI: S/p Whipple on 1/25 c/b, mult IR procedures for drainage of abscesses, VRE bacteremia w/ mediport removal 4/8, s/p R pleural pigtail; s/p PICC placement; s/p IR PEG on 4/12 c/b posterior gastric perforation, s/p IR perc drain x2 & 4/19 CT revealed oral contrast passing into lesser sac/retroperitoneum. ID & Palliative (for pain) following  5/2: Increased O2 requirements and tachycardia (concerned for PE) on [facility]. Patient febrile to 39.6 with rising WBC. Admitted to SICU 814 with concern for sepsis. Heparin gtt started for suspicion of PE. Paused 0200 on 5/3 for new bleeding from L perc and drop in HCT 22 (28).  5/3: 2nd R perc drain placed in IR in intra-hepatic abscess (infected biloma)      Active Issues / Relevant Events   Assumed care 1900-0700. Alert, disorientated to time x2 and place x1. VSSA. PRN ativan given x2 with positive effect. TF increased to 40, goal 45. Good UOP, no BMs. For full assessment, VS and I&Os.   Luciano Cutter, RN        To Do List  Flush Perc drains Q8hrs with 72mL NS (0400, 1200, 2000)  Next APTT draw   BG Q6hrs  Monitor for serotonin syndrome      Anticipatory Guidance / Discharge Planning  ---Critical Care Bundle---    Fluids & Electrolytes: None  Nutrition/last BM: TPN & lipids, starting TFs/ BM 5/4  Mobility Plan/Ability: activity as tolerated  Respiratory weaning (PST. TC)/pulmonary toilet: 2L NC  Ventilator settings/oxygen therapy orders correct? Yes  Neuro/sedation/pain/sedation vacation: PCA with basal rate and bolus dose; Dilaudid for breakthrough PRN, Ativan PRN, lidoderm patches  Delirium: Yes (based on CAM ICU)   Sleep Hygiene: Yes / No if appropriate  List of antimicrobials: Zyvox, Merrem, Caspo,  Drains/Lines/Tubes: L PICC, PIV x1, PEG, perc x3  Foley/Indication: No  DVT prophylaxis/aPTT: SCDs; heparin gtt  5/5  Indication for gastric acid suppression: None  Type and frequency of Labs:   Daily: PT-INR, aPTT, Mg, Phos, CBC & diff, BMP  Weekly: Sed rate, CRP, CK  Every Monday: Trigycerides, prealbumin  Every Mo, Thurs: HFP  Family Concerns and Updates: Updated 5/5  Patient's preferred name (AKA): Amanda Galloway  Code Status: FULL  Disposition/Level: ICU-WCC5 step-down

## 2017-07-09 NOTE — Progress Notes (Addendum)
Surgical Oncology/Hepatobiliary Surgery Progress Note     LOS: 102 days     Subjective:  Interval Events:     NAE overnight. Slightly confused this AM, wondering why she is on 814.  Pain stable and controlled on PCA.  Tolerated trickle TF's    Objective:  Vitals Sign Ranges for Past 24 Hours:  BP: (88-125)/(42-66)   Temp:  [36.1 C (97 F)-36.4 C (97.5 F)]   Temp src: Temporal (05/07 0800)  Heart Rate:  [83-94]   Resp:  [16-25]   SpO2:  [92 %-100 %]     Physical Exam:  General Appearance: Resting in bed, in no acute distress  Cardiac: regular rhythm and rate.  Respiratory: non-labored breathing on 2L NC  Abdomen: Soft, non distended, G tube site c/d/i, tender surrounding tube. Perc drains x 2 in hepatic abscesses with bilious drainage; Perc drain in abdominal collection with minimal serosanguinous output; g-tube to gravity with minimal output  Extremities: warm, well perfused    Labs:    CBC:    Recent Labs  Lab 07/08/17  2357 07/08/17  0018 07/07/17  1011 07/07/17  0013 07/06/17  0010 07/05/17  1714 07/05/17  0148 07/05/17  0047   WBC 7.5 7.0  --  4.1 11.3*  --  15.9* 15.8*   Hemoglobin 7.0* 6.9*  --  10.3* 7.8* 7.1* 7.0* 6.9*   Hematocrit 23* 23*  --  32* 25* 23* 23* 22*   Platelets 411* 389* 368 333 458*  --  459* 947*       Metabolic Panel:    Recent Labs  Lab 07/08/17  2357 07/08/17  0018 07/07/17  0013 07/06/17  0010 07/05/17  0047 07/04/17  0029   Sodium 134 132* 133 134 137 136   Potassium 3.5 3.4 3.7 4.0 3.2* 4.1   Chloride 98 96 97 96 100 98   CO2 '25 26 26 25 25 24   ' UN 21* '19 17 17 20 18   ' Creatinine 0.41* 0.45* 0.50* 0.47* 0.47* 0.48*   Glucose 116* 123* 129* 131* 122* 119*   Calcium 8.6 8.4* 8.1* 8.3* 8.6 9.2   Magnesium 2.1 1.9 1.9 1.5* 1.8 1.4*   Phosphorus 2.7 2.4* 2.7 2.7 4.1 4.7*          Assessment:  72 y.o. female with h/o recent PE and pancreatic cancer POD # 102  status post whipple procedure complicated by delayed gastric emptying.  NGT replaced on 04/04/17. UGI on 04/12/17 concerning for  obstruction just beyond the [place] in the efferent limb.  Course complicated on 0/96/28 by rising LFTs and sepsis with CT concerning for cholangitis given biliary tree obstruction and PV compression due to hematoma and afferent limb distention in setting of PE treatment. Is s/p IR PTC on 04/16/17 and IR percutaneous aspiration of anterior abdominal fluid collection on 04/24/17. IR LUQ perc drain placement 3/1. IR anterior abdominal 92f perc drain placement 3/3. IR anterior perc drain into left hepatic collection 3/6.     Plan:  - Continue TPN. PO with clears ad lib. Adv TF's 10cc q6 to goal. Hold for high residuals.   - Appreciate palliative care recommendations. Multimodal pain management  - Continue IV antibiotic regimen per ID Recommendations. On broad spectrum antibiotics and antifungal coverage.  - Follow up cultures. Enterococcus and yeast growing from most recent drain cultures.  - Monitor perc drain outputs. D/c LUQ perc drain today. Hold hep gtt at 1200 for drain removal.  - hx of PE.  Cont hep gtt for now  - Dispo: Pending clinical course. Will eventually require SNF. Appreciate excellent SICU care. Transfer to [facility] PCU when bed becomes available    Amanda Fisher, MD  07/09/2017     9:22 AM   General Surgery Resident      HPB-GI Attending Addendum:   I personally examined the patient, reviewed the notes, and discussed the plan of care with the residents and patient. Agree with detailed resident's note. Please see the note above for details of history, exam, labs, assessment/plan which reflect my input.     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   Stable fluid collection. Leakage resolved. Minimal output from drain. Will remove today.   Attempting dietary advance. Tube feedings at goal. Begin cycling.   Sepsis  resolved. Antibiotics altered.   Hyperglycemia with insulin sliding scale and recurrent need for insulin. continue   Post operative anemia. Stable.  Stable. No need for transfusion.  Pulmonary embolism. Present on admission. Heparin drip once stable for treatment. Holding for procedures.   Hypoalbuminemia. Sever protein calorie malnutrition.  Can stop TPN now that tube feedings at goal.   Depression. Ritalin helpful. Daily encouragement and reinforcement of goals.   Constipation . Aggressive regimen.   Physical debility. Requires rehabilitation setting.   Pain control. Increase PCA.       Amanda Metz, MD, FACS  Hepatobiliary, Pancreas & GI Surgery

## 2017-07-09 NOTE — Progress Notes (Signed)
Critical Care Medicine Attending Note:      If there are key historical elements or objective findings that I think deserve particular emphasis, I have recorded them below. A summary of key elements of our assessment and plans is listed in the resident / medical student / PA / NP's note. Please refer to this note for complete details of our mutually agreed upon findings, assessment, and recommendations.  The problem list reflects my input.    Summary of history of present illness:  The patient is a 72 year old woman with a medical history significant for hypertension, pulmonary embolism (diagnosed in November 2018, on Lovenox at home), fibromyalgia, gastroesophageal reflux disease, hypothyroidism, and anxiety/depression. 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 She required methadone for chronic abdominal pain.  She completed neoadjuvant chemotherapy and radiation therapy and then underwent a Whipple procedure on 1/25.   She has had a prolonged, complicated postoperative course with acute respiratory failure and recurrent sepsis due to abdominal abscesses (intra-hepatic and extra-hepatic, s/p multiple percutaneous drains, she has a gastric perforation).  She was readmitted to the SICU service on 5/2 due to concerns for recurrent sepsis (increased oxygen requirements, fever, tachycardia, and leukocytosis).  She is being treated with broad spectrum antibiotics (linezolid, meropenem, and caspofungin).  She has been improving.  Low rate tube feeds were started on 5/6 (yesterday).     Interval history:  No significant new problems noted overnight. She has constant abdominal pain.  She has noted no difference in the abdominal pain since starting the tube feeds.     Objective Section:  BP: (88-125)/(42-66)   Temp:  [36.1 C (97 F)-36.4 C (97.5 F)]   Temp src: Temporal (05/07 0800)  Heart Rate:   [83-94]   Resp:  [16-25]   SpO2:  [92 %-100 %]       O2 Flow Rate: 2 L/min (07/09/17 0437)      Intake/Output last 3 shifts:  I/O last 3 completed shifts:  05/06 0700 - 05/07 0659  In: 3329.4 (42.4 mL/kg) [I.V.:333 (0.2 mL/kg/hr); Other:90; NG/GT:390; IV Piggyback:1016.3]  Out: 1985 (25.3 mL/kg) [Urine:1675 (0.9 mL/kg/hr); Emesis/NG output:60; Drains:250]  Net: 1344.4  Weight: 78.6 kg     Intake/Output this shift:  I/O this shift:  05/07 0700 - 05/07 1459  In: 195.5 (2.5 mL/kg) [I.V.:23.7; NG/GT:70]  Out: 0 (0 mL/kg)   Net: 195.5  Weight: 78.6 kg       Vent settings for last 24 hours:       Physical exam:      General: The patient is awake, alert, interactive, and appropriate.     Pulmonary: Symmetric breath sounds, essentially clear bilaterally.     Cardiac: Regular rate and rhythm.     Abdominal: Hypoactive bowel sounds, soft, non-distended.       Extremities: Warm, pink, minimal or no UE edema, 1+ LE edema.      Labs:  Lab Results   Component Value Date    WBC 7.5 07/08/2017    HCT 23 (L) 07/08/2017    PLT 411 (H) 07/08/2017     Lab Results   Component Value Date    NA 134 07/08/2017    K 3.5 07/08/2017    CL 98 07/08/2017    CO2 25 07/08/2017    UN 21 (H) 07/08/2017    CREAT 0.41 (L) 07/08/2017    WBGLU  131 (H) 07/04/2017    PGLU 122 (H) 07/09/2017     Lab Results   Component Value Date    CA 8.6 07/08/2017    MG 2.1 07/08/2017    PO4 2.7 07/08/2017     Lab Results   Component Value Date    PTI 13.4 (H) 07/08/2017    INR 1.2 (H) 07/08/2017    PTT 55.4 (H) 07/09/2017     Lab Results   Component Value Date    ALT 97 (H) 07/08/2017    AST 143 (H) 07/08/2017    ALK 437 (H) 07/08/2017     Bilirubin,Direct   Date Value Ref Range Status   07/08/2017 <0.2 0.0 - 0.3 mg/dL Final     Bilirubin,Total   Date Value Ref Range Status   07/08/2017 0.2 0.0 - 1.2 mg/dL Final           Lab results: 07/04/17  0043 06/06/17  0905  04/16/17  1718   Lactate  --  2.2  < >  --    Lactate ART,WB 1.7*  --   < >  --    Lactate VEN,WB  --    --   --  4.6*   < > = values in this interval not displayed.           Currently Active/Followed Hospital Problems:  Active Hospital Problems    Diagnosis    *!*Pancreatic adenocarcinoma s/p Whipple 03/29/17     - Whipple on 1/25 with Dr. Ron Agee  [facility] course c/b gastric outlet obstruction d/t severe compression of the main portal vein and common bile duct by hematoma, ICU & intub (extub 2/17), mult IR procedures for drainage of abscesses, VRE bacteremia w/ mediport removal 4/8, s/p R pleural pigtail; s/p PICC after neg bld cx; s/p IR PEG on 4/12, and follow up CT & UGI w/ revealing posterior gastric perforation, s/p IR perc drain x 2 & 4/19 CT revealed oral contrast passing into lesser sac/retroperitoneum  - Continue TPN, with SSI for glucose control, Keep NPO for now. Keep G-tube to gravity  - IR drain replacement 5/3 -> 8 French drain into right intra-hepatic abscess. Communication with the adjacent bile ducts (infected biloma).   - ID following for multiple intra-abdominal fluid collections:    - s/p enterobacter cloacae bacteremia, Currently cultures + VRE, C.Glabrata and Enterobacter -> D/C Daptomycin and ertapenem ->  Started on Linezolid 600 mg IV twice daily 5/2  (in place of Daptomycin) watching carefully for serotonin syndrome given she was on methadone (d/c'd on 4/23)), Continue meropenem 1 gram IV every 8 hours and caspofungin 50 mg IV daily      Sepsis     - Afebrile, WBC normalized   - Started on Linezolid 600 mg IV twice daily 5/2  (in place of Daptomycin) watching carefully for serotonin syndrome given she was on methadone (d/c'd on 4/23)), Continue meropenem 1 gram IV every 8 hours and caspofungin 50 mg IV daily  - Followed by ID: the change in patient status is suggestive of active infection, less likely pneumonia. It is not clear if sepsis 2/2 intraabdominal source. CT of a/p 5/1 showed collections have overall improved and liver abscess successfully drained. Persistent fluid collection in  right liver lobe -> IR replaced drain 5/3   - 5/3:  BC NGTD, Drain cultures enterococcus and yeast      Acute pulmonary insufficiency     - Intubated 2/12 prior to [facility] placement, extubated 2/17   -  Weaning NC oxygen  - Chest x ray 5/2 showed Moderate bilateral pleural effusions with bibasilar opacities which may represent atelectasis or pneumonia. Superimposed pulmonary edema.   - currently 2L NC      Anemia     No current indication for transfusion      Cancer associated pain     - Methadone at home, last dose was 4/21. Managed by PCP Dr Irven [place]  - Palliative following  - Currently has dilaudid PCA with cont rate  - PRN ativan, holding scheduled during critical illness      Depression     - d/c Elavil and Lexapro       hx of Pulmonary embolism     - PE 01/18/17 on CT chest. No report of previous PE   - This hospitalization has been on therapeutic Lovenox, and Heparin gtt Off. Has completed 6 months of anticoagulation   - [name] left UE 5/2 -> No evidence of venous thrombosis in the visualized deep veins left upper extremity. There is limited evaluation of the lower portion of the left brachial vein due to overlying bandages  - Neuro protocol hep gtt      Hypothyroidism     - Synthroid IV while NPO           Attending summary impressions:     The patient continues to slowly improve from a recurrent episode of sepsis in the setting of infected abdominal fluid collections following a Whipple procedure for pancreatic adenocarcinoma.   The patient has acceptable hemodynamics and is clinically perfusing well.    She is on therapeutic anticoagulation (heparin infusion) for her pulmonary embolism history.   The patient is oxygenating and ventilating well on room air.  Will continue to encourage pulmonary toilet.  The patient has resolved respiratory insufficiency.    Her sepsis continues to improve.   She is afebrile with a normal, stable WBC count.  She remains on linezolid, meropenem, and caspofungin.  ID is  following.   She is tolerating tube feeds.  She is now at 20 cc/hour.  Will allow an oral diet.  Will continue the continue the TPN until she is near goal.   The surgical team is planning on removing one of the abdominal collection drains in the near future.   The patient is receiving thyroid replacement (levothyroxine) for hypothyroidism.    Encouraging activity.  PT is following her.   She can be transferred to the floor [facility]) when a bed is available.    Attending Attestation    This is a high complexity patient.     '[X]'  This patient is critically ill with at least 1 organ system failure including sepsis and anemia. The care I have delivered involved high complexity decision making to assess, manipulate, and support vital system function(s), to treat the vital organ system failure and/or prevent further deterioration of the patient's condition. All nursing documentation, laboratory data, test results, and radiographs were reviewed and interpreted by me. I have established the management plan for this patient's critical illness and have been immediately available to assist with patient care. My documented total critical care time reflects my own patient care time and does not include teaching or procedure time.     Critical care time: Personal time spent - (exclusive of performance of any procedures performed in the last 24 hours):  25 minutes    Signed by: Lorenza Chick, MD as of 07/09/2017 at 9:41 AM.

## 2017-07-09 NOTE — Plan of Care (Signed)
Post-Operative Hemodynamic Stability     Maintain Hemodynamic Stability Completed or Resolved          Mobility     Functional status is maintained or improved - Geriatric Progressing towards goal     Patient's functional status is maintained or improved Progressing towards goal

## 2017-07-09 NOTE — Progress Notes (Signed)
Physical Therapy Treatment Note:     07/09/17 1130   PT Tracking   PT TRACKING PT Assigned   Visit Number   Visit Number [facility]) / Treatment Day [facility]) 2   Visit Details [facility])   Visit Type [facility]) Follow Up   Reason for visit [facility]) General   Precautions/Observations   Precautions used Yes   LDA Observation IV lines;O2 (comment);Drain;Monitors;PCA   Fall Precautions General falls precautions   Pain Assessment   *Is the patient currently in pain? Yes   Pain (Before,During, After) Therapy During   0-10 Scale (not rated)   Pain Location Generalized   Pain Descriptors Sore   Pain Intervention(s) Refer to nursing for pain management   Additional comments Willing to continue with session    Cognition   Arousal/Alertness Delayed responses to stimuli   Following Commands Follows simple commands with increased time;Follows simple commands with repetition   Bed Mobility   Bed mobility Tested   Rolling Minimum assist to left;Head of bed elevated;Side rails up (#)   Supine to Sit Head of bed elevated;Side rails up (#);Minimum assist    Additional comments Patient requiring cuing and assist at hips to complete rolling for placement of bedpan prior to mobility out of bed. She needs assist at trunk with transition to upright, showing improved use of UE to push to upright witting.    Transfers   Transfers Tested   Sit to Stand Minimum    Stand to sit Minimum    Transfer Assistive Device rolling walker   Additional comments Transfer completed from edge of bed; to/from bedside chair. Cuing needed x1 for hand placement, then showing good carryover.    Mobility   Mobility Tested   Gait Pattern Decreased cadence;Decreased R step length;Decreased L step length   Ambulation Assist Contact guard;1 person assist   Ambulation Distance (Feet) 130   Ambulation Assistive Device rolling walker   Additional comments Patient completed ambulation with improved gait speed, and endurance. Vital signs stable. She was agreeable to second trial of ambulation  with nursing later in the day   Balance   Balance Tested   Sitting - Static Standby assist   Sitting - Dynamic Standby assist   Standing - Static Contact guard   Standing - Dynamic Contact guard   PT AM-PAC Mobility   Turning over in bed? 1   Sitting down on and standing up from a chair with arms? 1   Moving from lying on back to sitting on the side of the bed? 1   Moving to and from a bed to a chair? 3   Need to walk in hospital room? 3   Climbing 3 - 5 steps with a railing? 1   Total Raw Score 10   Standardized Score 32.29   CMS 1-100% Score 77   Assessment   Brief Assessment Remains appropriate for skilled therapy   Problem List Impaired functional mobility   Plan/Recommendation   Treatment Interventions Restorative PT   PT Frequency 2-4x/wk   Mobility Recommendations 1 assist transfers with RW, another assisting with line management   Discharge Recommendations (rehab)   Assessment/Recommendations Reviewed With: Patient;Nursing   Next PT Visit progress all mobility; higher level balance with ambulation, dual task   Time Calculation   PT Timed Codes 41   PT Untimed Codes 0   PT Unbilled Time 0   PT Total Treatment 41   Plan and Onset date   Plan of Care Date 07/08/17  Onset Date 03/29/17   Treatment Start Date 07/08/17     Amanda Galloway A. Philis Nettle, PT, DPT, [place]  Pager [phone]

## 2017-07-09 NOTE — Progress Notes (Signed)
Palliative Care Progress Note    HPI: [name] w/ PMH fibromyalgia, pancreatic CA s/p Whipple for 'cure' 03/29/17 with unfortunately complicated prolonged course including ICU & intub (extub 2/17), mult IR procedures for drainage of abscesses, VRE bacteremia w/ mediport removal 4/8, s/p R pleural pigtail; s/p PICC after neg bld cx; s/p IR PEG on 4/12 w/ increased pain & FU CT & UGI w/ revealing posterior gastric perforation, s/p IR perc drain x 2 & 4/19 CT revealed oral contrast passing into lesser sac/retroperitoneum. GOC discussion w/ Dr Ron Agee, pt's dtr/HCP and Palliative w/ pt agreeing to continue full treatment in hope of returning eventually to independent living. Pt continues on BSA & conservative bowel rest (no meds via PEG), started TPN 4/19 & now off methadone w/ pain controlled w/ Dilaudid PCA. Pt's most recent CT abd 4/26 showed resolution of gastric perforation, pt advancing her PO diet from clears. Pt transferred to ICU on 5/2 w/ fever and hypoxia, improved w/ Vanco & abx changes, now s/p IR 5/3 for hepatic abscess drain. Trickle TFs restarted on 5/6.  Palliative is following for ongoing symptom management and support.    Subjective: "My [birth year] is next week, my wish is to go home"    Objective: chart reviewed, visited pt in ICU, she was in good spirits sitting up in chair, tolerating TFs at 30 ml/hr; discussed restarting her methadone and cymbalta today; reviewed w/ dtr [name] that best to hold off on starting lyrica for fibromyalgia until methadone reaches peak effect in 1 wk; reviewed methadone will take 3-7 days to reach effect and anticipate pt will need to use more prn in next few days as her basal rate on PCA will be stopped; reviewed we will monitor for serotonin syndrome w/ initiation of cymbalta (possible reaction w/ linezolid) - dtr verbalized understanding and aware we would see this in first day or two of starting cymbalta, pt has not had issues w/ this in past; pt moving back to [facility] this  eve and discussed w/ accepting provider on [facility] my recommendations as above    Palliative Care ROS:   Pain   Moderate  Nausea   Mild  Anorexia   Moderate  Anxiety   Mild  Depression   Severe  Shortness of Breath   Mild  Tiredness/Fatigue   Moderate  Drowsiness/Sleepiness   Moderate  Airway Secretions   no  Constipation   no  Unable to Respond   no  Delirium   mild intermittent confusion, A+OX 3 to questions, smiling and good sense of humor  Last stool 5/7    Physical Examination:   BP: (88-119)/(42-60)   Temp:  [36.1 C (97 F)-36.6 C (97.9 F)]   Temp src: Temporal (05/07 1200)  Heart Rate:  [83-94]   Resp:  [16-24]   SpO2:  [93 %-100 %]   Gen appearance: [name], sitting up in recliner, pt reports mod abd pain & generalized pain all over, not as anxious today during visit  Lungs: easy resp, no cough, 2 L NC  Heart: reg, tele  Abd: large, distended, +BS, tolTFs via PEG, R side abd perc drain (scant pale green drainage) & new R side perc drain w/ red serosang drainage; L side abd perc drain (scant milky white drainage); voids on commode  Extrem: edematous LEs  Skin: warm, thin and fragile pink flaky dry skin on LEs, L arm PICC  Neuro: A+O x 3, mild confusion, baseline anxiety; offered encouragement and emotional support; pt  ambulates w/ walker, more relaxed today & smiling     Assessment/Plan: [name] w/ pancreatic CA s/p Whipple in Jan w/ prolonged complicated hosp course, 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 Abd abscesses w/ drains in place, continues on TPN and advancing diet, allowing po meds today. Pt case discussed w/ Palliative Phmd Despina Arias, rec stopping basal rate on Dilaudid PCA and resuming methadone and cymbalta.    Pain/dyspnea  S/p methadone (last IV dose was 4/21, now should be weaned out of system)  Dilaudid PCA 1 mg/hr + 1 mg IV Q 15 min prn (received total 53.8 mg from 5/6 6 am to 5/7 4 am, equiv to 1076  mg po ME w/ 20:1 ratio to methadone equiv to ~ 53 mg/d) stop basal rate on PCA, continue 1 mg Q 10 min prn (anticipate will use more PRN over next few days while waiting for methadone to take effect)  Dilaudid 1 mg IV Q 2 hrs prn available for RN to give above PCA if warranted (x 1 on 5/4)   Start methadone 7.5 mg po TID (total 22.5 mg/d) represents 50% reduction  Lidocaine patch daily x 2 (abd, LUE)  Acetaminophen 650 mg pr Q 4 hrs prn fever (x 1 on 5/5)    Anxiety/agitation/nausea/fatigue  S/p duloxetine (last dose 4/15), stopped for bowel rest - restart Cymbalta 20 mg po/d (watch for seritonin syndrome w/ linezolid which you would expect to see in next day or two w/ symptoms of tremor, diarrhea, diaphoresis or hemodynamic instability)  Aware pt w/ fibromyalgia, may consider lyrica in future - next week after methadone reaches peak  Levothyroxine 69 mcg IV daily (started IV 4/23, last TSH checked 12/14/16 was 2.87)  Ativan 0.25 mg IV Q 6 hrs prn (x 1 on 5/5, x 2 on 5/6)  Ondansetron 4 mg IV Q 6 hrs prn (x 1 on 5/1)  Encouraged complementary adjuvant therapies for coping, meditation app, aromatherapy, gentle massage     Prevention of opioid induced constipation, last stool 5/2  Bisacodyl supp pr daily     Dry flaky edematous Skin  Aquaphor prn   Eucerin prn for BLEs    GOC/prognosis  Full code  Dtr Rod Mae is HCP (very supportive and informed, former [facility] RN)  Goal currently is to prioritize pt's symptom control, long course though pt endorses agreement w/ continuing current treatment plan in hopes of eventually making recovery back to independent  living     Pt case discussed w/ bedside RN on 814 & accepting covering provider on [facility] made aware of med rec's.    We will continue to follow along. Please call my PC colleagues if questions/concerns.    Total Time Spent 40 minutes:   >50% of time was spent in counseling and/or coordination of care.     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    Anemia D64.9    Hypothyroidism E03.9    Pancreatic cancer C25.9    Anemia D64.9     hx of Pulmonary embolism I26.99    Acute pulmonary insufficiency J98.4    Depression F32.9    Sepsis A41.9  No Known Allergies (drug, envir, food or latex)    Scheduled Meds:    bisacodyl  10 mg Rectal Daily    [START ON 07/10/2017] levothyroxine  137 mcg Oral Daily @ 0600    insulin lispro  0-20 Units Subcutaneous Q6H    meropenem  1,000 mg Intravenous Q8H    linezolid  600 mg Intravenous Q12H    caspofungin  50 mg Intravenous Q24H    lidocaine  1 patch Transdermal Q24H    lidocaine  1 patch Transdermal Q24H       Continuous Infusions:    TPN (2 in 1) Adult Continuous      TPN (2 in 1) Adult Continuous 55 mL/hr at 07/09/17 1359    heparin 1,350 Units/hr (07/09/17 1359)    HYDROmorphone         PRN Meds:  phenazopyridine, acetaminophen, LORazepam, HYDROmorphone PF, Nursing communication- Give 4 OZ of fruit juice for BG < 70 mg/dl **AND** dextrose **AND** dextrose **AND** glucagon **AND** POCT glucose, naloxone, mineral oil-hydrophilic petrolatum, sodium chloride, sodium chloride, sodium chloride, dextrose, ondansetron

## 2017-07-09 NOTE — Interdisciplinary Rounds (Addendum)
HPI: [name] w/ PMH fibromyalgia, pancreatic CA s/p Whipple for 'cure' 03/29/17 with unfortunately complicated prolonged course including ICU & intub (extub 2/17), mult IR procedures for drainage of abscesses, VRE bacteremia w/ mediport removal 4/8, s/p R pleural pigtail; s/p PICC after neg bld cx; s/p IR PEG on 4/12 w/ increased pain & FU CT & UGI w/ revealing posterior gastric perforation, s/p IR perc drain x 2 & 4/19 CT revealed oral contrast passing into lesser sac/retroperitoneum.    Foley: yes (07/04/17)  Central Line: PICC (06/13/17)  DVT prophylaxis: (SQ heparin on hold) /SCDs  Indication for gastric acid suppression: NI  Sedation: Ativan PRN  Mobility: Up ad lib  Flu Vaccine screen: Complete    07/09/2017 Daily Interdisciplinary Goals of Care  TPA 1 more day  Start po meds  Called out to [facility] bed    07/08/17 Daily Interdisciplinary Goals of Care  Started trickle feeds today  WBC trending down  Called to [facility] stepdown    07/07/17 Daily Interdisciplinary Goals of Care  Temp normalized  Restart Heparin gtt  Remove foley  Aggressive pulmonary toilet    07/06/17 Daily Interdisciplinary Goals of Care  CXR this morning, worsening pleural effusion  Tmax 39.9, Watch for Serotonin Syndrome  Start SQ Heparin, may restart Heparin gtt tomorrow  Increase PCA dose  Continue NPO    07/05/17 Daily Interdisciplinary Goals of Care  New perc drain placed in intra-hepatic abscess and fluid cultured  SCD's  Continue holding heparin; check CBC after IR     07/04/17 Daily Interdisciplinary Goals of Care  Consult ID  Reviewed ABX regimen - Vancomycin x 1   Ertapenem to Meropenem  Unable to obtain sputum culture  Heparin gtt  U/S of PICC arm for DVT

## 2017-07-09 NOTE — Progress Notes (Addendum)
SICU Progress Note    The patient is a 72 year old woman with a medical history significant for hypertension, pulmonary embolism (diagnosed in November 2018, on Lovenox at home), fibromyalgia, gastroesophageal reflux disease, hypothyroidism, and anxiety/depression. 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 She required methadone for chronic abdominal pain.  She completed neoadjuvant chemotherapy and radiation therapy and then underwent a Whipple procedure on 1/25.  She has had a prolonged, complicated postoperative course with acute respiratory failure and sepsis due to abdominal abscesses (s/p multiple percutaneous drains, she has a gastric perforation).  She was readmitted to the SICU service on 5/2 due to concerns for recurrent sepsis (increased oxygen requirements, fever, tachycardia, and leukocytosis).  She is being treated with broad spectrum antibiotics (linezolid, meropenem, and caspofungin).    Interval History: a bit confused early this morning. Feeling okay this morning. Pain the same. Tolerating TFs at 20cc per hour    Past Med/Sx History:   Past Medical History:   Diagnosis Date    Acute kidney failure 03/31/2017    Cancer     Depression     Fibromyalgia     Hypothyroidism      Physical Exam by Systems:  General: resting comfortably  Cardiac: RRR, S1S2  Pulm: clear to ausculation, non labored   Abd: x3 drains in place, right side drains with green/brown output, left side drain with bloody output, PEG in place   Extremities: warm, erythema of bilateral calves - stable    Assessment and Plan for Active/Followed Hospital Problems:   Active Hospital Problems    Diagnosis    *!*Pancreatic adenocarcinoma s/p Whipple 03/29/17     - Whipple on 1/25 with Dr. Ron Agee  [facility] course c/b gastric outlet obstruction d/t severe compression of the main portal vein and common bile duct by  hematoma, ICU & intub (extub 2/17), mult IR procedures for drainage of abscesses, VRE bacteremia w/ mediport removal 4/8, s/p R pleural pigtail; s/p PICC after neg bld cx; s/p IR PEG on 4/12, and follow up CT & UGI w/ revealing posterior gastric perforation, s/p IR perc drain x 2 & 4/19 CT revealed oral contrast passing into lesser sac/retroperitoneum  - Continue TPN, with SSI for glucose control, Keep NPO for now. Keep G-tube to gravity  - IR drain replacement 5/3 -> 8 French drain into right intra-hepatic abscess. Communication with the adjacent bile ducts (infected biloma).   - ID following for multiple intra-abdominal fluid collections:    - s/p enterobacter cloacae bacteremia, Currently cultures + VRE, C.Glabrata and Enterobacter -> D/C Daptomycin and ertapenem ->  Started on Linezolid 600 mg IV twice daily 5/2  (in place of Daptomycin) watching carefully for serotonin syndrome given she was on methadone (d/c'd on 4/23)), Continue meropenem 1 gram IV every 8 hours and caspofungin 50 mg IV daily      Sepsis     - Afebrile, WBC normalized   - Started on Linezolid 600 mg IV twice daily 5/2  (in place of Daptomycin) watching carefully for serotonin syndrome given she was on methadone (d/c'd on 4/23)), Continue meropenem 1 gram IV every 8 hours and caspofungin 50 mg IV daily  - Followed by ID: the change in patient status is suggestive of active infection, less likely pneumonia. It is not clear if sepsis 2/2 intraabdominal source. CT of a/p 5/1 showed collections have overall improved  and liver abscess successfully drained. Persistent fluid collection in right liver lobe -> IR replaced drain 5/3   - 5/3:  BC NGTD, Drain cultures enterococcus and yeast      Acute pulmonary insufficiency     - Intubated 2/12 prior to [facility] placement, extubated 2/17   - Weaning NC oxygen  - Chest x ray 5/2 showed Moderate bilateral pleural effusions with bibasilar opacities which may represent atelectasis or pneumonia. Superimposed  pulmonary edema.   - currently 2L NC      Anemia     No current indication for transfusion      Cancer associated pain     - Methadone at home, last dose was 4/21. Managed by PCP Dr Irven [place]  - Palliative following  - Currently has dilaudid PCA with cont rate  - PRN ativan, holding scheduled during critical illness      Depression     - d/c Elavil and Lexapro       hx of Pulmonary embolism     - PE 01/18/17 on CT chest. No report of previous PE   - This hospitalization has been on therapeutic Lovenox, and Heparin gtt Off. Has completed 6 months of anticoagulation   - [name] left UE 5/2 -> No evidence of venous thrombosis in the visualized deep veins left upper extremity. There is limited evaluation of the lower portion of the left brachial vein due to overlying bandages  - Neuro protocol hep gtt      Hypothyroidism     - Synthroid IV while NPO         Author: Sheryn Bison, MD  as of: 07/09/2017  at: 8:03 AM

## 2017-07-10 ENCOUNTER — Inpatient Hospital Stay: Payer: Medicare (Managed Care)

## 2017-07-10 LAB — BASIC METABOLIC PANEL
Anion Gap: 10 (ref 7–16)
CO2: 27 mmol/L (ref 20–28)
Calcium: 8.9 mg/dL (ref 8.6–10.2)
Chloride: 100 mmol/L (ref 96–108)
Creatinine: 0.45 mg/dL — ABNORMAL LOW (ref 0.51–0.95)
GFR,Black: 117 *
GFR,Caucasian: 101 *
Glucose: 116 mg/dL — ABNORMAL HIGH (ref 60–99)
Lab: 22 mg/dL — ABNORMAL HIGH (ref 6–20)
Potassium: 3.4 mmol/L (ref 3.3–5.1)
Sodium: 137 mmol/L (ref 133–145)

## 2017-07-10 LAB — CBC AND DIFFERENTIAL
Baso # K/uL: 0.1 10*3/uL (ref 0.0–0.1)
Basophil %: 0.7 %
Eos # K/uL: 0.7 10*3/uL — ABNORMAL HIGH (ref 0.0–0.4)
Eosinophil %: 8 %
Hematocrit: 23 % — ABNORMAL LOW (ref 34–45)
Hemoglobin: 7.1 g/dL — ABNORMAL LOW (ref 11.2–15.7)
IMM Granulocytes #: 0.2 10*3/uL
IMM Granulocytes: 1.8 %
Lymph # K/uL: 1.5 10*3/uL (ref 1.2–3.7)
Lymphocyte %: 18 %
MCH: 28 pg/cell (ref 26–32)
MCHC: 31 g/dL — ABNORMAL LOW (ref 32–36)
MCV: 91 fL (ref 79–95)
Mono # K/uL: 0.9 10*3/uL (ref 0.2–0.9)
Monocyte %: 10.7 %
Neut # K/uL: 5.1 10*3/uL (ref 1.6–6.1)
Nucl RBC # K/uL: 0 10*3/uL (ref 0.0–0.0)
Nucl RBC %: 0.1 /100 WBC (ref 0.0–0.2)
Platelets: 409 10*3/uL — ABNORMAL HIGH (ref 160–370)
RBC: 2.5 MIL/uL — ABNORMAL LOW (ref 3.9–5.2)
RDW: 17.2 % — ABNORMAL HIGH (ref 11.7–14.4)
Seg Neut %: 60.8 %
WBC: 8.3 10*3/uL (ref 4.0–10.0)

## 2017-07-10 LAB — HEPATIC FUNCTION PANEL
ALT: 103 U/L — ABNORMAL HIGH (ref 0–35)
AST: 84 U/L — ABNORMAL HIGH (ref 0–35)
Albumin: 2.7 g/dL — ABNORMAL LOW (ref 3.5–5.2)
Alk Phos: 597 U/L — ABNORMAL HIGH (ref 35–105)
Bilirubin,Direct: <0.2 mg/dL (ref 0.0–0.3)
Bilirubin,Total: <0.2 mg/dL (ref 0.0–1.2)
Total Protein: 6.7 g/dL (ref 6.3–7.7)

## 2017-07-10 LAB — PHOSPHORUS: Phosphorus: 3 mg/dL (ref 2.7–4.5)

## 2017-07-10 LAB — APTT
aPTT: 54.3 s — ABNORMAL HIGH (ref 25.8–37.9)
aPTT: 57.6 s — ABNORMAL HIGH (ref 25.8–37.9)

## 2017-07-10 LAB — AEROBIC CULTURE

## 2017-07-10 LAB — POCT GLUCOSE
Glucose POCT: 109 mg/dL — ABNORMAL HIGH (ref 60–99)
Glucose POCT: 111 mg/dL — ABNORMAL HIGH (ref 60–99)
Glucose POCT: 116 mg/dL — ABNORMAL HIGH (ref 60–99)
Glucose POCT: 128 mg/dL — ABNORMAL HIGH (ref 60–99)
Glucose POCT: 139 mg/dL — ABNORMAL HIGH (ref 60–99)

## 2017-07-10 LAB — PROTIME-INR
INR: 1.1 (ref 0.9–1.1)
Protime: 12.3 s (ref 10.0–12.9)

## 2017-07-10 LAB — MAGNESIUM: Magnesium: 2 mg/dL (ref 1.6–2.5)

## 2017-07-10 LAB — ANAEROBIC CULTURE: Anaerobic Culture: 0

## 2017-07-10 MED ORDER — POTASSIUM CHLORIDE 20 MEQ/15ML (10%) PO SOLN *I*
20.0000 meq | Freq: Once | ORAL | Status: AC
Start: 2017-07-10 — End: 2017-07-10
  Administered 2017-07-10: 20 meq via GASTROSTOMY
  Filled 2017-07-10: qty 15

## 2017-07-10 MED ORDER — STERILE WATER FOR INJECTION (TPN USE ONLY) *I*
INTRAVENOUS | Status: AC
Start: 2017-07-10 — End: 2017-07-11
  Filled 2017-07-10: qty 660

## 2017-07-10 MED ORDER — ERTAPENEM IN NS 1 GM *I*
1000.0000 mg | INTRAVENOUS | Status: DC
Start: 2017-07-10 — End: 2017-07-11
  Administered 2017-07-10: 1000 mg via INTRAVENOUS
  Filled 2017-07-10 (×3): qty 50

## 2017-07-10 NOTE — Progress Notes (Signed)
07/10/17 0800   [facility] Patient Class Review   Patient Class Review Inpatient   Patient Class Effective 03/29/2017  Earnest Conroy RN   Utilization  Management  X [phone]  Page [phone]

## 2017-07-10 NOTE — Consults (Signed)
Medical Nutrition Therapy Brief Note: TPN check    [name] w/ pancreatic CA s/p Whipple in Jan w/ prolonged complicated hosp course, 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 Abd abscesses w/ drains in place, continues on TPN and advancing diet.     Patient remains on TPN for nutrition support. Chart, labs, medications / TPN order, I/Os reviewed. Na 137 (WNL), K+ 3.4 (WNL), Cl 100 (WNL), CO2 27 (WNL), Ca 8.9 (WNL), PO4 3.0 (WNL), Mg 2.0 (WNL). BGs ranging from 98 - 128 over the past 24 hours.  MAR reviewed with repletions noted for KCl. I/Os significant for 205 ml drain output, BM x1. Current TPN order reviewed - no changes to TPN order needed at this time.    Recommendations:  1.  Recommend to continue 2-in-1 TPN without lipids, via adult central line; amino acid dextrose solution @ 55 ml/hr x 24 hrs (1320 ml fluid per day), with  Amino acids 75 g/L  Dextrose 204 g/L  Na+ 126 meq/L   K+ 22 meq/L  Ca++ 4.5 meq/L   Mg 7 meq/L  Phos 7 mmol/L   Standard MVI  Standard trace elements  ADD: 200 mg levocarnitine  Chloride: Acetate ratio- max chloride  TPN without lipids will provide a total of 1590 ml fluid, 1296 kcal, 95 g protein and 269 g dextrose/day.  TFs at current rate adequate to meet balance of calorie needs.    2.   Continue tube feeds as ordered: Vital AF 1.2 @ 10 ml/hr continuous, advance by 10 ml q 12 hrsto goal rate of 65 ml/hr continuous, FWF 30 ml q 4 hrs.               -At goal, provides 1560 ml formula, 1872 kcal, 117 g protein and 1444 ml free fluid/day.  Adjust flushes PRN.              -TPN weaning: when tolerating TFs at >=45 ml/hr continuous, can run TPN at half rate x 1 hr (to prevent rebound hypoglycemia) and then d/c              -When able to resume PO meds, recommend starting 2000 international unit vit D daily and eval for need for iron supplement.  TPN does not provide iron.  Inflammation may skew (elevate) ferritin levels.      3.    Continue TPN monitoring parameters     Will continue to monitor.     Laurence Aly, [place]  Pager [phone]

## 2017-07-10 NOTE — Progress Notes (Addendum)
Infectious Diseases (Team 3) Follow Up Note    Personally reviewed chart, labs, medications. Patient seen today.     CC:  F/U VRE bacteremia, Liver and abdominal abscesses, s/p Enterobacter cloacae bacteremia, Intra-abdominal abscesses positive for VRE, enterobacter and candida glabrata    HPI/Interval history:   72yo old female with pancreatic cancer, s/p Whipple procedure on 03/29/17 after neoadjuvant chemotherapy and radiation. Her post op course was c/b delayed gastric emptying, gastric distension and obstruction and a large surgical bed hematoma causing CBD and hepatic vein compression, severe biliary sepsis, s/p placement of a right hepatic duct external biliary drain placed on 2/12, found to have enterobacter cloacae bacteremia (2/12) presumed to be secondary to infection of the hematoma. However CT of abdomen (2/18) showed the hematoma had resolved but noted to have a well-circumscribed and larger anterior abdominal collection percutaneously, which was aspirated (2/19) and cultures grew enterobacter cloacae and Candida glabrata. ID service consulted on 2/21 with question of treating C.glabrata.  Caspofungin was started and a perc drain was placed LUQ 3/1, then anterior abdominal perc drain placed 3/3, and then anterior perc drain into left hepatic collection 05/08/17, the goal being source control with drain placements. She had been on Zosyn but her enterobacter became (amp-C inducible) thus switched to ertapenem and Daptomycin added on 05/04/17 for continued broad coverage.  Caspofungin was switched to oral high-dose fluconazole (60m/kg) daily once sensitivities returned on C.glabrata. Pt on methadone for pain control, QTc 409. Abdominal perc drains where removed once drainage was minimal as well as biliary tube was removed. However, there remained several communicating multiloculated fluid collections in LUQ and some areas of reaccumulation on 05/23/17 CT of a/p.     Summary of abdominal drain removals: Mid  Abd on 3/17,  LUQ on 3/19, Biliary Tube on 3/26, right lateral abd on 05/29/17.    (05/30/17) She was switched over to oral therapy on in hopes to ready her for SNF Rehab, ie, changed to Bactrim for enterobacter and to remain on high dose fluconazole for C. Glabrata, (no good option for VRE given DDI with Linezolid), with plan to reimage to fluid collections. She was initially stable on oral antibiotic regimen and ID signed off on 06/03/17.      (06/06/17) ID Fellow team called for re-consult, concern for early sepsis given patient now with rigor, lethargy, and tachycardia, though she was afebrile and no leukocytosis at that time.  A CT of her abdomen and pelvis was obtained as well as blood cultures (both on 06/06/17). CT demonstrated interval development of a new loculated 3 cm fluid collection in the right hepatic lobe, while the hepatic fluid collections remain stable in size. She also had chest CT which showed a moderate left pleural effusion (no change from prior). She as empirically restarted on ertapenem 1gm daily and daptomycin at 851mkg as she was on prior to oral regimen, and continued on fluconazole 8005maily.    (06/07/17) left thoracentesis with pigtail placement. The pleural fluid analysis c/w exudate and there were 1604 nucleated cells. No growth on cultures, pigtail removed on 06/09/17.    Blood cultures from 4/4, both periphery and Mediport grew VRE, and subsequently blood cultures on 4/6 and 4/8 were positive for VRE only Mediport.  Initially VRE bacteremia were felt to be due to the pelvic abscesses, but now VRE likely from seeded mediport which was removed on 06/10/17.   A PICC was placed on 06/13/17 (negative for 48 hours).   (06/14/17) PEG placed. She  has had persistent, severe pain around PEG site since placement, palliative care and primary team have been addressing with pain medication adjustments and alternative modalities.   (06/17/17) repeat CT of A/P demonstrated enlarging and new fluid collections  c/w abscesses.    (06/18/17) Daptomycin was increased to 10 mg/kg (rounded to 800 mg).    (06/19/17) Drain placements into hepatic and retrogastric fluid collections. Hepatic drainage grew 1+ VRE (S- Dapto dose dependent, Linezolid, Gentamycin), and both specimens from perigastric cultures grew C. Glabrata, sensitivities requested   (06/21/17) CT of abdomen/pelvis showed interval improvement of both perigastric and liver abscesses, and also a gastric perforation. Made NPO, and placed on TPN.    (06/28/17) C. glabrata resistant to fluconazole and voriconazole.  Changed to IV caspofungin.     (5/219)  Sepsis: febrile (39.4), leukocytosis (16K), increased respiratory demands up to 6L nc, transferred to SICU. BCx negative, drain placed in remaining persistent liver abscess, grew VRE and C.albicans (final ID pending/ sensitivities requested on fungus).  Her antibiotics (hi dose dapto, ertapenem, caspo) were broadened to Linezolid (for lung coverage though pneumonia low on differential), meropenem for PsA coverage, and remained on Caspofungin. She stabilized quickly.  Pain continues to be an issue (abd and generalized), methadone restarted.   Back to floor 07/09/17  (07/10/17) Left abd drain (perigastric) removed.  Meropenum narrowed to Ertapenem.  VRE with two isolates, one now resistant to Daptomycin.  Will continue on Linezolid/Ertapenem/Caspo. Watch for SSS given methadone restarted.      Subjective: "Today's not a good day, lots of pain".  Denies F/C, c/o intermittent nausea not requiring PRNs, no emesis, no diarrhea that she recalls.  Started on po diet, taking a few bites only, TF at 50/hr, TPN weaning down.  Complains of abdominal pain (esp at PEG site) as well as generalized pain. Daughter at bedside.     ROS: Reviewed 6 systems in detail, see above    Current Meds:  Scheduled Meds:   bisacodyl  10 mg Rectal Daily    levothyroxine  137 mcg Oral Daily @ 0600    DULoxetine  20 mg Oral Q24H    methadone  7.5 mg Oral Q8H     insulin lispro  0-20 Units Subcutaneous Q6H    meropenem  1,000 mg Intravenous Q8H    linezolid  600 mg Intravenous Q12H    caspofungin  50 mg Intravenous Q24H    lidocaine  1 patch Transdermal Q24H    lidocaine  1 patch Transdermal Q24H     Continuous Infusions:   TPN (2 in 1) Adult Continuous      TPN (2 in 1) Adult Continuous 55 mL/hr at 07/10/17 1007    heparin Stopped (07/10/17 0758)    HYDROmorphone       PRN Meds:.   calcium carbonate  1,000 mg Oral BID PRN    HYDROmorphone PF  0.5 mg Intravenous Q1H PRN    Or    HYDROmorphone PF  1 mg Intravenous Q1H PRN    LORazepam  0.5 mg Oral Q6H PRN    heparin lock flush  50 Units Intracatheter PRN    ondansetron  4 mg Intravenous Q6H PRN     Objective:  BP: (98-126)/(55-82)   Temp:  [36.2 C (97.2 F)-36.9 C (98.4 F)]   Temp src: Temporal (05/08 1134)  Heart Rate:  [86-99]   Resp:  [16-24]   SpO2:  [91 %-97 %]     General Appearance: sitting in chair, grimacing with  any movement,   HEENT: Sclera anicteric, MMM. OP clear  Pulm: diminished throughout, High pitched with fine crackle left base  CV: RRR, S1S2, no M/R/G, LE edema bilaterally +1  Abdomen: mildly distended, semi-soft upper quads, soft lower quads, PEG intact, right abd drains (liver) x 2 with clear bilious drainage.   Extremities:WWP  Skin:Warm and dry; reddened skin b/l LEs  Neuro: alert, appropriately answering questions.   Lines/Drains/Tubes: PICC LUE (4/11), PIV RW (5/7),       Recent Labs  Lab 07/10/17  0008 07/08/17  2357 07/08/17  0018   WBC 8.3 7.5 7.0   Hemoglobin 7.1* 7.0* 6.9*   Hematocrit 23* 23* 23*   Platelets 409* 411* 389*   Seg Neut % 60.8 64.2 84.4   Lymphocyte % 18.0 14.6 7.8   Monocyte % 10.7 9.9 0.9   Eosinophil % 8.0 8.9 4.3       Recent Labs  Lab 07/10/17  0008 07/08/17  2357 07/08/17  0018   Sodium 137 134 132*   Potassium 3.4 3.5 3.4   CO2 '27 25 26   ' UN 22* 21* 19   Creatinine 0.45* 0.41* 0.45*   Glucose 116* 116* 123*   Calcium 8.9 8.6 8.4*         Lab results:  07/08/17  0752 07/05/17  1714 07/01/17  0025 06/27/17  0414 06/24/17  0021   Total Protein 6.3 6.3 6.6 5.5* 5.7*   Albumin 2.6* 2.4* 2.8* 2.0* 2.2*   ALT 97* 57* 59* 33 69*   AST 143* 55* 70* 29 87*   Alk Phos 437* 422* 532* 437* 540*   Bilirubin,Total 0.2 <0.2 <0.2 <0.2 <0.2       Lab Results  Component Value Date/Time   CRP 154 (H) 07/05/2017 0913   CRP 125 (H) 06/28/2017 0020   CRP 282 (H) 06/18/2017 0102       Lab Results  Component Value Date/Time   Sedimentation Rate 73 (H) 07/05/2017 0913   Sedimentation Rate 48 (H) 07/05/2017 0047   Sedimentation Rate 63 (H) 06/28/2017 0020       Micro:   1/25: Intraoperative bile: GS 0 pmns, no organisms, Cx: no growth  2/12: 1 set of blood cultures from the right Mediport (no peripheral bld was sent): positive for Enterobacter cloacae complex in 70 hrs, sensitive to zosyn.   2/18 abdominal GS had >25 PMNs, many GPC in pairs/chains, many GNB and cultures 4+ Enterobacter cloacae complex. The cultures also grew 1+ Candida glabrata   2/21: BCx from the Right peripheral IV, Mediport, L IJ:  No growth  05/03/17: IR-guided I&D with drain placement of the left upper quadrant collection: GS > 25 PMNs, many GNB and GN diplococci, Cultures with 4+ Enterobacter cloacae complex (resistant to Zosyn and CTX), C. Albicans and C. Glabrata, and Candida tropicalis  05/04/17:    1 set of blood cultures from left IJ: VRE at 15.3 TTP. Daptomycin sensitivities: MIC 31mg/ml (dose dependent),  Linezolid   1 set from periphery: VRE and Enterobacter cloacae at 14.8 TTP   05/05/17:  IR-guided I&D with drain placement of the intrahepatic collection: GS 1-10 PMNs, very few GPC in pairs. Fungal cultures with Candida glabrata  05/05/17: Blood cultures from periphery, Mediport and IJ: no growth  05/04/17: Urine cultures obtained (for unclear reason): no growth  05/08/17: Abscess GS: >25 PMNs, many GPC in chains, few GNB. Aerobic cultures growing 2+ Enterobacter (ertapenem, Cipro, TMP/SMX-sensitive) and 3+VRE, Fungal:  C. Glabrata (sens to  caspo, vori and dose dependent sens to fluconazole)  06/06/17: 2 sets of BCx (1 set each from periphery and Mediport) - taken after restarting ertapenem: IVAD grew VRE ttp 18.2 hours,  Periphery (Left arm) grew VRE ttp 17.3.  06/07/17 Pleural fluid (left) GS 1-10 PMN's, 1-10 Nucleated white cells, No organisms seen and 1604 nucleated cells. No growth on culture.   06/07/17 Abdominal abscess: labs cancelled - no specimens received.   06/08/17: Mediport BC + for VRE in 23 hrs (R-Amp, R- tetracycline per lab, suppressed result, R- doxycycline;  S-Linezolid, S-Dapto dose dependent),  Right hand peripheral cx: No growth   06/10/17: Blood culture 2/2: 1 periphery (RHand), 1 from Mediport before removed: no growth  06/11/17: Blood Culture 2/2 (both periphery) - no growth   06/19/17:  Perigastric fluid #1:  GS 10-25 PMNs -  GPB, GNB, GPC in pairs. 1+ C. Glabrata (R- fluconazole, voriconazole, S- Caspofungin); 2+ Enterococcus faecium (R- amp, PenG, Vanco; S- Linezolid)  06/19/17:  Liver fluid collection:  GS: 0 PMNs, Fungal and AFB stains negative, 1+ enterococcus faecium (R- Amp, PenG, Vanco, S- Dapto dose dependent, Linezolid)  06/19/17 : Perigastric fluid #2: GS: >25 PMNs, Gram negative bacilli; 3+ C. Glabrata.   07/04/17 U/A negative  07/04/17 Blood Cultures 3/3 (1 set PICC proximal port, 2 sets periphery): NGTD  07/04/17 MRSA amplification Nares: negative  07/05/17 Blood Culture 3/3 (2 PICC, 1 Periph) - NGTD  07/05/17 Liver Abscess (IR drain)- <1PMNs, GPC in prs and chains, 1 colony candida albicans (preliminary), 4+ Enterococcus faecium - two isolates with one resistant to Daptomycin, the other dose dependent.        Imaging/Other Relevant Diagnostics:   No new imaging for review    Assessment and Plan:  ID problem(s):   - s/p enterobacter cloacae bacteremia  - Multiple and extensive Intra-abdominal abscesses growing VRE, enterobacter and candida glabrata  - VRE Bacteremia     72 y.o. female with h/o recent PE and  pancreatic cancer, s/p whipple procedure (goal of cure), complicated by delayed gastric emptying, elevated LFTs and sepsis, mild malnutrition 2/2 early satiety and anorexia (requiring PEG), and multiple intraabdominal and hepatic abscesses.   See HPI above for detailed history.    Today Monick's left abd (perigastric) drain was removed, 2 hepatic drains remain in, each with sm amount clear bilious drainage.  She is afebrile with a normal white count.  She is c/o "a lot of pain everywhere" and particularly around PEG area.  Her diet is being advanced, poor appetite, taking only a few bites, TPN being weaned, continues with tube feeds at low rate (50/hr).  She is having intermittent nausea (same pattern for months) but not asking for prns.  Her blood cultures from 5/2 are still with no growth to date, liver abscess cultures from grew C. Albicans (susceptibilities requested today) and 2 isolates of VRE, one resistant to Daptomycin, therefore cannot put her back on Daptomycin.  Will continue with Linezolid despite methadone and SNRI, with benefits in this case outweighing the risks.  Patient will need to be monitored for serotonin syndrome given possible DDI (ie, agitation, confusion, hyperreflexia, tachycardia, myoclonus, shivering).  She also will need to be monitored for meylosuppression which may occur  >2 weeks of treatment with Linezolid, thrombocytopenia most frequently observed initially.  She is showing a steady increase in transaminases, may be related to antifungals, however, there are several other possible reasons for elevated liver enzymes, will continue to monitor.  ALT/AST about 3x ULN  currently.     Recommendations:   Change from meropenem back to ertapenem 1 gram daily (covering provider aware)   Continue Linezolid IV 600 mg twice daily, watch closely for serotonin syndrome and thrombocytopenia   Continue Caspofungin 50 mg IV daily   Follow fungal culture sensitivities  Please follow twice weekly  (at minimum) CBC with diff   Follow daily CMP  ID Team 3 will continue to follow along    Patient was seen with and plan discussed with ID attending, Dr. William Hamburger.     Thank you for allowing [name] to participate in the care of this patient  Please call with questions/concerns.     Lonzo Candy, ANP-BC  Infectious Diseases, Team 3  Pager [phone]  Office Ph [phone]      ID Attending Addendum:  Patient seen and examined with the patient.  Reviewed the history, labs, imaging studies, and nursing flow sheets.  Discussed the case in detail with the nurse and with the family.  Agree with above:  linezolid necessary because one of the VRE species is resistant to daptomycin.  There is a risk of serotonin syndrome in light of the potential interaction of linezolid and some of her medications, notably methadone and SSRIs, but the benefit of linezolid outweighs the risk.  Watch patient carefully.

## 2017-07-10 NOTE — Progress Notes (Signed)
Palliative Care Progress Note    HPI: [name] w/ PMH fibromyalgia, pancreatic CA s/p Whipple for 'cure' 03/29/17 with unfortunately complicated prolonged course including ICU & intub (extub 2/17), mult IR procedures for drainage of abscesses, VRE bacteremia w/ mediport removal 4/8, s/p R pleural pigtail; s/p PICC after neg bld cx; s/p IR PEG on 4/12 w/ increased pain & FU CT & UGI w/ revealing posterior gastric perforation, s/p IR perc drain x 2 & 4/19 CT revealed oral contrast passing into lesser sac/retroperitoneum. GOC discussion w/ Dr Ron Agee, pt's dtr/HCP and Palliative w/ pt agreeing to continue full treatment in hope of returning eventually to independent living. Pt continues on BSA & conservative bowel rest (no meds via PEG), started TPN 4/19 & now off methadone w/ pain controlled w/ Dilaudid PCA. Pt's most recent CT abd 4/26 showed resolution of gastric perforation, pt advancing her PO diet from clears. Pt transferred to ICU on 5/2 w/ fever and hypoxia, improved w/ Vanco & abx changes, now s/p IR 5/3 for hepatic abscess drain. TFs restarted on 5/6 & advancing on liquid diet.  Palliative is following for ongoing symptom management and support.    Subjective: "I feel terrible, I hurt all over and especially my left side (abd)"    Objective: chart reviewed, visited pt and dtr [name] this eve, pt having tough day w/ more pain, had a drain removed today and site tender, still c/o pain at PEG site (not new), pt reminded that it will take few days for methadone to take effect and during this most recent set back requiring NPO and ICU she has developed more tolerance to her opioids so would not be surprised if she'll need further increase in methadone next week; reiterated goal to reintroduce meds wisely as to not contribute to sedation or confusion; pt tearful and receiving prn ativan, she restarted her antidepressant and will monitor for s/s serotonin syndrome; writer shared in pt and dtr's hopes that her symptoms  will be better managed by next week as they both celebrate Birthdays and look forward to pt's son visiting this [place] w/ his 71 yo son     Palliative Care ROS:   Pain   Severe  Nausea   Mild  Anorexia   Mild  Anxiety   Moderate  Depression   Severe  Shortness of Breath   Mild  Tiredness/Fatigue   Mild  Drowsiness/Sleepiness   Mild  Airway Secretions   no  Constipation   no  Unable to Respond   no  Delirium   no  Last stool 5/7    Physical Examination:   BP: (102-126)/(50-82)   Temp:  [35.6 C (96.1 F)-37.1 C (98.8 F)]   Temp src: Temporal (05/08 2000)  Heart Rate:  [86-99]   Resp:  [16-18]   SpO2:  [82 %-95 %]   Gen appearance: [name], sitting up in recliner, pt reports mod to severe abd pain w/ intermittent sharp LUQ abd pain & chronic generalized pain all over r/t fibromyalgia, tearful during visit  Lungs: easy resp, no cough, 2 L NC  Heart: reg, tele  Abd: large, distended, +BS, tol TFs via PEG, R side abd perc drain (scant pale green drainage) & new R side perc drain w/ red serosang drainage; voids on commode  Extrem: edematous LEs  Skin: warm, thin and fragile pink flaky dry skin on LEs, L arm PICC  Neuro: 72+O x 3, baseline anxiety exacerbated today d/t increased pain; offered encouragement and  emotional support    Assessment/Plan: [name] w/ pancreatic CA s/p Whipple in Jan w/ prolonged complicated hosp course, 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 Abd abscesses w/ drains in place, advancing diet, tolerating po meds (methadone and cymbalta) will monitor for s/s serotonin syndrome and anticipated to stop TPN soon. Pt case discussed w/ SW, understand discussions re SNF placement have resumed. Anticipate pt will need a week to transition completely off PCA d/t high tolerance and just restarted methadone yesterday.    Pain/dyspnea  Dilaudid PCA  1 mg IV Q 15 min prn (received total 58.5 mg from 5/7 4 am to 5/8 6 am, equiv  to 1170 mg po ME w/ 20:1 ratio to methadone equiv to ~ 58 mg/d) (anticipate will use more PRN over next few days while waiting for methadone to take effect)  Dilaudid 1 mg IV Q 2 hrs prn available for RN to give above PCA if warranted (x 1 on 5/8)   Methadone 7.5 mg po TID (total 22.5 mg/d) restarted 5/7 (ECG on 5/2 w/ QTc 392)  Lidocaine patch daily x 2 (abd, LUE)  Acetaminophen 650 mg pr Q 4 hrs prn fever (x 1 on 5/5)    Anxiety/agitation/nausea/fatigue  Cymbalta 20 mg po/d restarted (watch for seritonin syndrome w/ linezolid which you would expect to see in next day or two w/ symptoms of tremor, diarrhea, diaphoresis or hemodynamic instability)  Aware pt w/ fibromyalgia, may consider lyrica in future - next week after methadone reaches peak  Levothyroxine 69 mcg IV daily (started IV 4/23, last TSH checked 12/14/16 was 2.87)  Ativan 0.25 mg IV Q 6 hrs prn (x 3 on 5/7, x 3 on 5/8)  Ondansetron 4 mg IV Q 6 hrs prn (x 1 on 5/1)  Encouraged complementary adjuvant therapies for coping, meditation app, aromatherapy, gentle massage     Prevention of opioid induced constipation, last stool 5/7  Bisacodyl supp pr daily     Dry flaky edematous Skin  Aquaphor prn   Eucerin prn for BLEs    GOC/prognosis  Full code  Dtr Rod Mae is HCP (very supportive and informed, former [facility] RN)  Goal currently is to prioritize pt's symptom control, long course though pt endorses agreement w/ continuing current treatment plan, dispo to SNF in hopes of eventually making recovery back to independent living    Pt case discussed w/ bedside RN and SW. No med changes for today, reiterated that pt likely will need to use more PRN dilaudid in next few days while waiting for methadone to take effect.    We will continue to follow along. Please call if questions/concerns.    Total Time Spent 40 minutes:   >50% of time was spent in counseling and/or coordination of care.     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    Anemia D64.9    Hypothyroidism E03.9    Pancreatic cancer C25.9    Anemia D64.9     hx of Pulmonary embolism I26.99    Acute pulmonary insufficiency J98.4    Depression F32.9    Sepsis A41.9       No Known Allergies (drug,  envir, food or latex)    Scheduled Meds:    ertapenem  1,000 mg Intravenous Q24H    bisacodyl  10 mg Rectal Daily    levothyroxine  137 mcg Oral Daily @ 0600    DULoxetine  20 mg Oral Q24H    methadone  7.5 mg Oral Q8H    insulin lispro  0-20 Units Subcutaneous Q6H    linezolid  600 mg Intravenous Q12H    caspofungin  50 mg Intravenous Q24H    lidocaine  1 patch Transdermal Q24H    lidocaine  1 patch Transdermal Q24H       Continuous Infusions:    TPN (2 in 1) Adult Continuous 55 mL/hr at 07/10/17 1926    heparin 1,350 Units/hr (07/10/17 2225)    HYDROmorphone         PRN Meds:  acetaminophen, LORazepam, HYDROmorphone PF, Nursing communication- Give 4 OZ of fruit juice for BG < 70 mg/dl **AND** dextrose **AND** dextrose **AND** glucagon **AND** POCT glucose, naloxone, mineral oil-hydrophilic petrolatum, sodium chloride, sodium chloride, sodium chloride, dextrose, ondansetron

## 2017-07-10 NOTE — Progress Notes (Signed)
2251: Pt c/o SOB. Raised HOB checked spo2 at 82%.  Spo2 up to 91% on RA, unable to maintain.  Placed 1L n/c.Marland Kitchenspo2 between 88-91%.  Adjusted oxygen up to 2L, spO2 up to 95% on 2L.  Notified Mikey Bussing, NP.  Will continue to monitor.

## 2017-07-10 NOTE — Progress Notes (Signed)
Surgical Oncology Surgery Progress Note    Patient: Amanda Galloway    LOS: 103 days    Attending: [place]  Pain controlled  Tolerating TF     OBJECTIVE  Physical Exam:  Temp:  [36.2 C (97.2 F)-36.9 C (98.4 F)] 36.4 C (97.5 F)  Heart Rate:  [83-99] 86  Resp:  [17-24] 17  BP: (98-120)/(55-80) 120/80   GEN: no apparent distress  HEENT: NCAT, face symmetric  CHEST: nonlabored respirations  ABD: soft, nondistended, tender surrounding G-tube that is CDI, Perc Drains in hepatic abscess with bilious drainage, Perc Drain in abdominal collection with minimal serosanguinous output  NEURO/MOTOR: alert, appropriate  EXTREMITIES: atraumatic  VASCULAR: feet wwp, no edema    Intake/Output:  05/07 0700 - 05/08 0659  In: 4315.9 [P.O.:240; I.V.:492.9]  Out: 1415 [Urine:1325; Drains:90]     Medications:  Current Facility-Administered Medications   Medication    bisacodyl (DULCOLAX) suppository 10 mg    TPN (2 in 1) Adult Continuous    levothyroxine (SYNTHROID, LEVOTHROID) tablet 137 mcg    DULoxetine (CYMBALTA) DR capsule 20 mg    methadone (DOLOPHINE) tablet 7.5 mg    insulin lispro (HumaLOG,ADMELOG) injection 0-20 Units    heparin 100 units/ml infusion    meropenem (MERREM) 1 g IVPB    linezolid (ZYVOX) IVPB 600 mg    caspofungin (CANCIDAS) 50 mg in sodium chloride 0.9% 100 mL mini-bag    acetaminophen (TYLENOL) suppository 650 mg    LORazepam (ATIVAN) 2 mg/mL injection 0.25 mg    HYDROmorphone (DILAUDID) injection 1 mg    dextrose (GLUTOSE) 40 % oral gel 15 g    And    dextrose 50% (0.5 g/mL) injection 25 g    And    glucagon (GLUCAGEN) injection 1 mg    naloxone (NARCAN) 0.4 mg/mL injection 0.1 mg    HYDROmorphone (DILAUDID) PCA 1 mg/mL    mineral oil-hydrophilic petrolatum (AQUAPHOR) ointment    sodium chloride 0.9 % flush 10 mL    sodium chloride 0.9 % flush 10 mL    sodium chloride 0.9 % FLUSH REQUIRED IF PATIENT HAS IV    dextrose 5 % FLUSH REQUIRED IF  PATIENT HAS IV    lidocaine (LIDODERM) 5 % patch 1 patch    lidocaine (LIDODERM) 5 % patch 1 patch    ondansetron (ZOFRAN) injection 4 mg     Facility-Administered Medications Ordered in Other Encounters   Medication    etomidate (AMIDATE) 2 mg/mL injection    rocuronium (ZEMURON) 10 mg/mL injection         Laboratory values:   Recent Labs      07/10/17   0008  07/08/17   2357   WBC  8.3  7.5   Hemoglobin  7.1*  7.0*   Hematocrit  23*  23*   Platelets  409*  411*   INR  1.1  1.2*     No components found with this basename: APTT, PT Recent Labs      07/10/17   0008  07/08/17   2357   Sodium  137  134   Potassium  3.4  3.5   Chloride  100  98   CO2  27  25   UN  22*  21*   Creatinine  0.45*  0.41*   Glucose  116*  116*   Calcium  8.9  8.6   Magnesium  2.0  2.1   Phosphorus  3.0  2.7    Recent Labs      07/08/17   0752   AST  143*   ALT  97*   Alk Phos  437*   Bilirubin,Total  0.2   Bilirubin,Direct  <0.2     Recent Labs      07/08/17   0752  07/08/17   0018   Total Protein  6.3   --    Albumin  2.6*   --    Prealbumin   --   13*     No results for input(s): AMY, LIP in the last 72 hours.   GLUCOSE:   Recent Labs      07/10/17   0645  07/10/17   0007  07/09/17   1921  07/09/17   1207  07/09/17   0557  07/08/17   2357  07/08/17   1814  07/08/17   1246  07/08/17   0559  07/08/17   0016   Glucose POCT  128*  109*  98  116*  122*  119*  124*  133*  136*  120*     Imaging: No results found.     ASSESSMENT  Amanda Galloway is a 72 y.o. female with h/o recent PE and pancreatic cancer POD # 102  status post whipple procedure complicated by delayed gastric emptying.  NGT replaced on 04/04/17. UGI on 04/12/17 concerning for obstruction just beyond the [place] in the efferent limb.  Course complicated on 4/53/64 by rising LFTs and sepsis with CT concerning for cholangitis given biliary tree obstruction and PV compression due to hematoma and afferent limb distention in setting of PE treatment. Is s/p IR PTC on 04/16/17 and IR  percutaneous aspiration of anterior abdominal fluid collection on 04/24/17. IR LUQ perc drain placement 3/1. IR anterior abdominal 72f perc drain placement 3/3. IR anterior perc drain into left hepatic collection 3/6.     PLAN      Remove LUQ drain after holding Heparin Infusion   Diet tube feeding with tray ([facility] Full liquids   TPN (2 in 1) Adult Continuous diet   Analgesia with PCA, Methadone, managed by palliative care   Continue Antibiotics per ID, follow up cultures   Home meds   OOB/ambulate   Dispo: SNF. Will discuss with Social Work.     Author: DKathrin Greathouse MD as of: 07/10/2017  at: 6:57 AM     [facility]B-GI Attending Addendum:   I personally examined the patient, reviewed the notes, and discussed the plan of care with the residents and patient. Agree with detailed resident's note. Please see the note above for details of history, exam, labs, assessment/plan which reflect my input.         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   Stable fluid collection. Leakage resolved. Minimal output from drain. Will remove today.   Attempting dietary advance. Tube feedings at goal. Begin cycling.   Sepsis resolved. Antibiotics altered.   Hyperglycemia with insulin sliding scale and recurrent need for insulin. continue   Post operative anemia. Stable.  Stable. No need for transfusion.  Pulmonary embolism. Present on admission. Heparin drip once stable for treatment. Holding for procedures.   Hypoalbuminemia. Sever protein calorie malnutrition.  Can stop TPN now that tube feedings at goal.   Depression. Ritalin helpful. Daily encouragement  and reinforcement of goals.   Constipation . Aggressive regimen.   Physical debility. Requires rehabilitation setting.   Pain control. PCA. Resume oral methadone and  oral medications.         Delano Metz, MD, FACS  Hepatobiliary, Pancreas & GI Surgery

## 2017-07-10 NOTE — Progress Notes (Signed)
Procedure Report: PERCUTANEOUS DRAIN REMOVAL      Type of Drain:  IR placed percutaneous drain    Location: LUQ    Procedure Details   Procedure explained to patient. Drainage tube was disconnected from the hub. One suture connecting drain to skin was removed with a suture removal kit. Provider cut coil to ensure it was released the locking hub was rotated counterclockwise 180 degrees and unlocked. Provider pulled drain gently- no resistance occured. Pressure held at site. The tip was examined and intact upon removal. The area was inspected and without redness or drainage. An 2x2 gauze and tegaderm was placed over exit site.     Complications: none    Patient Condition: good     Plan/Orders Change dressing as needed, especially if saturated or compromised.

## 2017-07-10 NOTE — Plan of Care (Signed)
Bowel Elimination     Elimination patterns are normal or improving Maintaining        Fluid and Electrolyte Imbalance     Fluid and Electrolyte imbalance Maintaining        GI Bleeding Elimination     Elimination of patterns are normal or improving Maintaining        Mobility     Functional status is maintained or improved - Geriatric Maintaining     Patient's functional status is maintained or improved Maintaining        Nutrition     Patient's nutritional status is maintained or improved Maintaining     Nutritional status is maintained or improved - Geriatric Maintaining        Pain/Comfort     Patient's pain or discomfort is manageable Maintaining     Patient's pain or discomfort is manageable Maintaining        Post-Operative Bowel Elimination     Elimination pattern is normal or improving Maintaining        Psychosocial     Demonstrates ability to cope with illness Maintaining        Safety     Patient will remain free of falls Maintaining

## 2017-07-10 NOTE — Progress Notes (Signed)
30 day parking pass letter provided to pts daughter per her request.    Pt nearing medical readiness for SNF. TPN to stop today.     Pt is on IV antibiotics, team working with ID on duration and medication required for discharge. Will update packet once this information is obtained from team.    SW reviewed medicaid pending choices with daughter and identified which ones accept IV antibiotics. Daughter aware that pt will need to apply to all medicaid pending facilities.    SW touched base with [facility] and medicaid case is still pending at this time.    Will continue to assist as needed and plan for safe discharge.

## 2017-07-11 ENCOUNTER — Inpatient Hospital Stay: Payer: Medicare (Managed Care)

## 2017-07-11 LAB — CBC AND DIFFERENTIAL
Baso # K/uL: 0.1 10*3/uL (ref 0.0–0.1)
Basophil %: 0.6 %
Eos # K/uL: 0.7 10*3/uL — ABNORMAL HIGH (ref 0.0–0.4)
Eosinophil %: 6.1 %
Hematocrit: 24 % — ABNORMAL LOW (ref 34–45)
Hemoglobin: 7.3 g/dL — ABNORMAL LOW (ref 11.2–15.7)
IMM Granulocytes #: 0.3 10*3/uL
IMM Granulocytes: 2.7 %
Lymph # K/uL: 1.5 10*3/uL (ref 1.2–3.7)
Lymphocyte %: 13.4 %
MCH: 28 pg/cell (ref 26–32)
MCHC: 30 g/dL — ABNORMAL LOW (ref 32–36)
MCV: 92 fL (ref 79–95)
Mono # K/uL: 1 10*3/uL — ABNORMAL HIGH (ref 0.2–0.9)
Monocyte %: 8.9 %
Neut # K/uL: 7.6 10*3/uL — ABNORMAL HIGH (ref 1.6–6.1)
Nucl RBC # K/uL: 0 10*3/uL (ref 0.0–0.0)
Nucl RBC %: 0.1 /100 WBC (ref 0.0–0.2)
Platelets: 435 10*3/uL — ABNORMAL HIGH (ref 160–370)
RBC: 2.6 MIL/uL — ABNORMAL LOW (ref 3.9–5.2)
RDW: 17.5 % — ABNORMAL HIGH (ref 11.7–14.4)
Seg Neut %: 68.3 %
WBC: 11.1 10*3/uL — ABNORMAL HIGH (ref 4.0–10.0)

## 2017-07-11 LAB — HEPATIC FUNCTION PANEL
ALT: 135 U/L — ABNORMAL HIGH (ref 0–35)
AST: 136 U/L — ABNORMAL HIGH (ref 0–35)
Albumin: 2.6 g/dL — ABNORMAL LOW (ref 3.5–5.2)
Alk Phos: 633 U/L — ABNORMAL HIGH (ref 35–105)
Bilirubin,Direct: <0.2 mg/dL (ref 0.0–0.3)
Bilirubin,Total: <0.2 mg/dL (ref 0.0–1.2)
Total Protein: 6.6 g/dL (ref 6.3–7.7)

## 2017-07-11 LAB — BLOOD CULTURE
Bacterial Blood Culture: 0
Bacterial Blood Culture: 0
Bacterial Blood Culture: 0

## 2017-07-11 LAB — BASIC METABOLIC PANEL
Anion Gap: 11 (ref 7–16)
CO2: 26 mmol/L (ref 20–28)
Calcium: 8.4 mg/dL — ABNORMAL LOW (ref 8.6–10.2)
Chloride: 100 mmol/L (ref 96–108)
Creatinine: 0.4 mg/dL — ABNORMAL LOW (ref 0.51–0.95)
GFR,Black: 121 *
GFR,Caucasian: 105 *
Glucose: 125 mg/dL — ABNORMAL HIGH (ref 60–99)
Lab: 21 mg/dL — ABNORMAL HIGH (ref 6–20)
Potassium: 3.7 mmol/L (ref 3.3–5.1)
Sodium: 137 mmol/L (ref 133–145)

## 2017-07-11 LAB — PROTIME-INR
INR: 1.1 (ref 0.9–1.1)
Protime: 12.4 s (ref 10.0–12.9)

## 2017-07-11 LAB — POCT GLUCOSE
Glucose POCT: 126 mg/dL — ABNORMAL HIGH (ref 60–99)
Glucose POCT: 134 mg/dL — ABNORMAL HIGH (ref 60–99)
Glucose POCT: 143 mg/dL — ABNORMAL HIGH (ref 60–99)

## 2017-07-11 LAB — PHOSPHORUS: Phosphorus: 2.5 mg/dL — ABNORMAL LOW (ref 2.7–4.5)

## 2017-07-11 LAB — MAGNESIUM: Magnesium: 1.9 mg/dL (ref 1.6–2.5)

## 2017-07-11 LAB — APTT: aPTT: 56 s — ABNORMAL HIGH (ref 25.8–37.9)

## 2017-07-11 MED ORDER — ACETAMINOPHEN 325 MG PO TABS *I*
650.0000 mg | ORAL_TABLET | Freq: Once | ORAL | Status: AC
Start: 2017-07-11 — End: 2017-07-11
  Administered 2017-07-11: 650 mg via ORAL
  Filled 2017-07-11: qty 2

## 2017-07-11 MED ORDER — HEPARIN SODIUM 5000 UNIT/ML SQ *I*
5000.0000 [IU] | Freq: Three times a day (TID) | SUBCUTANEOUS | Status: DC
Start: 2017-07-11 — End: 2017-07-11

## 2017-07-11 MED ORDER — STERILE WATER FOR IRRIGATION IR SOLN *I*
900.0000 mL | Freq: Once | Status: AC
Start: 2017-07-11 — End: 2017-07-11

## 2017-07-11 MED ORDER — IOHEXOL 350 MG/ML (OMNIPAQUE) IV SOLN *I*
1.0000 mL | Freq: Once | INTRAVENOUS | Status: AC
Start: 2017-07-11 — End: 2017-07-11
  Administered 2017-07-11: 118 mL via INTRAVENOUS

## 2017-07-11 MED ORDER — METOCLOPRAMIDE HCL 5 MG/ML IJ SOLN *I*
10.0000 mg | Freq: Four times a day (QID) | INTRAMUSCULAR | Status: DC
Start: 2017-07-11 — End: 2017-07-15
  Administered 2017-07-11 – 2017-07-15 (×16): 10 mg via INTRAVENOUS
  Filled 2017-07-11 (×16): qty 2

## 2017-07-11 MED ORDER — ERTAPENEM IN NS 1 GM *I*
1000.0000 mg | INTRAVENOUS | Status: DC
Start: 2017-07-11 — End: 2017-07-18
  Administered 2017-07-11 – 2017-07-17 (×7): 1000 mg via INTRAVENOUS
  Filled 2017-07-11 (×8): qty 50

## 2017-07-11 MED ORDER — SODIUM CHLORIDE 0.9 % IV SOLN WRAPPED *I*
50.0000 mL/h | Status: DC
Start: 2017-07-11 — End: 2017-07-15
  Administered 2017-07-11: 50 mL/h via INTRAVENOUS
  Administered 2017-07-11 (×2): 50 mL/h
  Administered 2017-07-11: 50 mL/h via INTRAVENOUS
  Administered 2017-07-11 (×2): 50 mL/h
  Administered 2017-07-11: 50 mL/h via INTRAVENOUS
  Administered 2017-07-12 (×6): 50 mL/h
  Administered 2017-07-12: 50 mL/h via INTRAVENOUS
  Administered 2017-07-12 (×4): 50 mL/h
  Administered 2017-07-12: 50 mL/h via INTRAVENOUS
  Administered 2017-07-12: 50 mL/h
  Administered 2017-07-12: 50 mL/h via INTRAVENOUS
  Administered 2017-07-12 – 2017-07-13 (×8): 50 mL/h
  Administered 2017-07-13: 50 mL/h via INTRAVENOUS
  Administered 2017-07-13 – 2017-07-14 (×12): 50 mL/h
  Administered 2017-07-14: 50 mL/h via INTRAVENOUS
  Administered 2017-07-14 (×7): 50 mL/h
  Administered 2017-07-15: 50 mL/h via INTRAVENOUS
  Administered 2017-07-15: 50 mL/h
  Administered 2017-07-15 (×2): 50 mL/h via INTRAVENOUS
  Administered 2017-07-15: 50 mL/h
  Administered 2017-07-15: 50 mL/h via INTRAVENOUS

## 2017-07-11 NOTE — Progress Notes (Signed)
At 9:30 am, pt complained of nausea - given Zofran prn as ordered. Residual was 225 cc of white TF looking material. TF (Vital AF - at goal of 65 cc/hr) stopped.  Alyssa Haugh, PA notified - wanted TF held for 1 hour, but patient was still nauseated - held TF until 12:45 - another residual checked - 200 cc of orange cream colored fluid. Alyssa Haugh, PA again notified and stated it was ok to resume TF - TF restarted at 65 cc/hr. CT of abdomen with IV contrast done and awaiting results. Pt has needed 1L of 02 as she has desated to 88% on RA - on 1L 94-95%. Will continue to monitor and will continue with current plan of care.

## 2017-07-11 NOTE — Plan of Care (Signed)
Problem: Safety  Goal: Patient will remain free of falls  Outcome: Maintaining      Problem: Mobility  Goal: Functional status is maintained or improved - Geriatric  Outcome: Progressing towards goal      Problem: Nutrition  Goal: Patient's nutritional status is maintained or improved  Outcome: Maintaining  Maintaining only because of TPN and TF     Problem: Psychosocial  Goal: Demonstrates ability to cope with illness  Outcome: Progressing towards goal

## 2017-07-11 NOTE — Progress Notes (Signed)
Report Given   Handoff received from [place] on unit [facility]. Aware of patient coming to [facility] for CT of abd. and pelvis.         Procedure Summary

## 2017-07-11 NOTE — Consults (Signed)
Medical Nutrition Therapy Brief Note: TPN check    [name] w/ PMH fibromyalgia, pancreatic CA s/p Whipple for 'cure' 03/29/17 with unfortunately complicated prolonged course including ICU & intub (extub 2/17), mult IR procedures for drainage of abscesses, VRE bacteremia w/ mediport removal 4/8, s/p R pleural pigtail; s/p PICC after neg bld cx; s/p IR PEG on 4/12 w/ increased pain & FU CT & UGI w/ revealing posterior gastric perforation, s/p IR perc drain x 2 & 4/19 CT revealed oral contrast passing into lesser sac/retroperitoneum. GOC discussion w/ Dr Ron Agee, pt's dtr/HCP and Palliative w/ pt agreeing to continue full treatment in hope of returning eventually to independent living. Pt continues on BSA & conservative bowel rest (no meds via PEG), started TPN 4/19 & now off methadone w/ pain controlled w/ Dilaudid PCA. Pt's most recent CT abd 4/26 showed resolution of gastric perforation, pt advancing her PO diet from clears. Pt transferred to ICU on 5/2 w/ fever and hypoxia, improved w/ Vanco & abx changes, now s/p IR 5/3 for hepatic abscess drain. TFs restarted on 5/6 & advancing on liquid diet.    Patient remains on TPN for nutrition support. Chart, labs, medications / TPN order, I/Os reviewed. Pt's TFs at goal, complains of nausea this AM (Zofran x2) however stated she did not have nausea yesterday (woke up feeling nauseated this AM). On full liquid diet. Na 137 (WNL), K+ 3.7 (WNL), Cl 100 (WNL), CO2 26 (WNL), Ca 9.52 (WNL corrected for albumin 2.6), PO4 2.5 (L), Mg 1.9 (WNL). Recent t bili <0.2 (WNL) from 5/9. BGs ranging from 111 - 139 over the past 24 hours. MAR reviewed with repletions noted for KCl. I/Os reviewed. Pt with +1 general, BUE/BLE edema. TFs currently provide 1560 ml formula, 1872 kcal, 117 g protein and 1444 ml free fluid/day. Will need higher fluid flush in TPN if unable to drink much fluid. Current TPN order reviewed - please see recommendations below for changes.    Recommendations:  1.   Recommend to d/c TPN. Please run TPN at 1/2 rate (28 ml/hr) x 1 hour prior to d/c. Check BGs 30 minutes after d/c to monitor for rebound hypoglycemia.  2.  Continue tube feeds as ordered. Recommend to increase FWF to 90 ml Q4H. Continue to adjust FWF prn based on fluid status and po fluid intake.    3.  Continue full liquid diet as ordered  4.  Continue to monitor BMP, Mg and PO4. Replete as needed.   5.  Continue to monitor weight at least weekly, I/Os       Will continue to monitor.     Laurence Aly, [place]  Pager [phone]

## 2017-07-11 NOTE — Progress Notes (Signed)
Palliative Care Progress Note    HPI: [name] w/ PMH fibromyalgia, pancreatic CA s/p Whipple for 'cure' 03/29/17 with unfortunately complicated prolonged course including ICU & intub (extub 2/17), mult IR procedures for drainage of abd abscesses, VRE bacteremia w/ mediport removal 4/8, s/p R pleural pigtail; s/p PICC after neg bld cx; s/p IR PEG on 4/12 w/ increased pain & FU CT & UGI w/ revealing posterior gastric perforation (now resolved), s/p IR perc drains. GOC discussion w/ Dr Ron Agee, pt's dtr/HCP and Palliative w/ pt agreeing to continue full treatment in hope of returning eventually to independent living. Pt continues on BSA & s/p conservative bowel rest (no meds via PEG), TPN & off methadone w/ pain controlled w/ Dilaudid PCA. Pt's most recent CT abd 4/26 showed resolution of gastric perforation, pt advancing her PO diet from clears. Pt transferred to ICU on 5/2 w/ fever and hypoxia, improved w/ Vanco & abx changes, now s/p IR 5/3 for hepatic abscess drain. TFs restarted on 5/6 & advancing diet, TPN stopped 5/8.  Palliative is following for ongoing symptom management and support.    Subjective: "It was a better day, I'm tired from getting the CT and I just got sick for no reason" (vomited TFs, were infusing at 65 ml/h)    Objective: chart reviewed, visited pt this afternoon s/p episode of emesis w/ transient bradycardia to 40s likely vagal response, now resolved, pt distressed w/ N/V but states this is new onset, had been tolerating TFs at 65 ml/h w/ sips of clears; pt states no new or increased pain, reports pain all over and LUQ abd pain about the same; reviewed w/ RN and pt's dtr that she had received 1st dose cymbalta last eve and so will monitor for serotonin syndrome but no clear indication the emesis episode was related; pt's dtr feels pt has been in more comfortable and in better spirits today vs yesterday; relayed that pt had used less dilaudid prn (43 mg) c/w previous day; looking forward to her son  and grandson visiting this WE    Palliative Care ROS:   Pain   Moderate  Nausea   Moderate  Anorexia   Mild  Anxiety   Moderate  Depression   Severe  Shortness of Breath   Mild  Tiredness/Fatigue   Mild  Drowsiness/Sleepiness   Mild  Airway Secretions   no  Constipation   no  Unable to Respond   no  Delirium   no  Last stool 5/7    Physical Examination:   BP: (96-132)/(54-80)   Temp:  [36 C (96.8 F)-37.1 C (98.8 F)]   Temp src: Temporal (05/09 1647)  Heart Rate:  [40-96]   Resp:  [16-18]   SpO2:  [82 %-96 %]   Gen appearance: [name], sitting up in recliner, pt reports chronic generalized pain all over r/t fibromyalgia, states abd pain is no worse than before; less anxious and tearful today though was upset over recent emesis  Lungs: easy resp, no cough, 2 L NC  Heart: reg, tele  Abd: large, distended, +BS, full TFs via PEG, R side abd perc drain (scant pale green drainage) & new R side perc drain w/ red serosang drainage; voids on commode  Extrem: edematous LEs  Skin: warm, thin and fragile pink flaky dry skin on LEs, L arm PICC  Neuro: A+O x 3, baseline anxiety & chronic fibromyalgia pain; offered encouragement and emotional support    Assessment/Plan: [name] w/ pancreatic  CA s/p Whipple in Jan w/ prolonged complicated hosp course, 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 Abd abscesses w/ drains in place, advancing diet, tolerating po meds (methadone and cymbalta) will monitor for s/s serotonin syndrome. Pt w/ recent emesis and likely vagal response, TFs held temporarily.     Pain/dyspnea  Dilaudid PCA  1 mg IV Q 10 min prn (received total 43 mg from 5/8 640 am to 5/9 550 am, equiv to 860 mg po morphine) (anticipate few days for methadone to take full effect)  Dilaudid 1 mg IV Q 2 hrs prn available for RN to give above PCA if warranted (x 1 on 5/8, x 1 on 5/9)   Methadone 7.5 mg po TID (total 22.5 mg/d) restarted 5/7 (ECG  on 5/2 w/ QTc 392)  Lidocaine patch daily x 2 (abd, LUE)  Acetaminophen 650 mg pr Q 4 hrs prn fever (x 1 on 5/5)    Anxiety/agitation/nausea/fatigue  Cymbalta 20 mg po/d restarted 5/8(watch for seritonin syndrome w/ linezolid which you would expect to see in next day or two w/ symptoms of tremor, N/V/D, diaphoresis or hemodynamic instability)  Aware pt w/ fibromyalgia, may consider lyrica in future - next week after methadone reaches peak  Levothyroxine 69 mcg IV daily (started IV 4/23, last TSH checked 12/14/16 was 2.87)  Ativan 0.25 mg IV Q 6 hrs prn (x 3 on 5/8, x 1 on 5/9)  Ondansetron 4 mg IV Q 6 hrs prn (x 1 on 5/1, x 1 on  5/9)  Encouraged complementary adjuvant therapies for coping, meditation app, aromatherapy, gentle massage     Prevention of opioid induced constipation, last stool 5/7  Bisacodyl supp pr daily     Dry flaky edematous Skin  Aquaphor prn   Eucerin prn for BLEs    GOC/prognosis  Full code  Dtr Rod Mae is HCP (very supportive and informed, former [facility] RN)  Goal currently is to prioritize pt's symptom control, long course though pt endorses agreement w/ continuing current treatment plan, dispo to SNF in hopes of eventually making recovery back to independent living    Pt case discussed w/ bedside RN and covering provider. No med changes for today, reassurance provided to pt, the effects of stopping basal rate on PCA have passed & she's using PRN dilaudid appropriately while waiting for methadone to take full effect.    We will continue to follow along. I've asked my PC colleagues to check in as Probation officer off until next week. Please call if questions/concerns.    Total Time Spent 35 minutes:   >50% of time was spent in counseling and/or coordination of care.     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    Malignant neoplasm of  head of pancreas C25.0    Pancreatic adenocarcinoma s/p Whipple 03/29/17 C25.9    Anemia D64.9    Hypothyroidism E03.9    Pancreatic cancer C25.9    Anemia D64.9     hx of Pulmonary embolism I26.99    Acute pulmonary insufficiency J98.4    Depression F32.9    Sepsis A41.9       No Known Allergies (drug, envir, food or latex)    Scheduled Meds:    iohexol (OMNIPAQUE) 300 mg/mL oral solution 63mL/870mL Water  900 mL  Oral Once    ertapenem  1,000 mg Intravenous Q24H    metoclopramide IV  10 mg Intravenous Q6H    bisacodyl  10 mg Rectal Daily    levothyroxine  137 mcg Oral Daily @ 0600    DULoxetine  20 mg Oral Q24H    methadone  7.5 mg Oral Q8H    insulin lispro  0-20 Units Subcutaneous Q6H    linezolid  600 mg Intravenous Q12H    caspofungin  50 mg Intravenous Q24H    lidocaine  1 patch Transdermal Q24H    lidocaine  1 patch Transdermal Q24H       Continuous Infusions:    sodium chloride 50 mL/hr (07/11/17 1859)    heparin 1,350 Units/hr (07/11/17 1859)    HYDROmorphone         PRN Meds:  acetaminophen, LORazepam, HYDROmorphone PF, Nursing communication- Give 4 OZ of fruit juice for BG < 70 mg/dl **AND** dextrose **AND** dextrose **AND** glucagon **AND** POCT glucose, naloxone, mineral oil-hydrophilic petrolatum, sodium chloride, sodium chloride, sodium chloride, dextrose, ondansetron

## 2017-07-11 NOTE — Progress Notes (Addendum)
Surgical Oncology Surgery Progress Note    Patient: Amanda Galloway    LOS: 104 days    Attending: [place]  Desaturated Oxygen overnight, CXR obtained and placed on 2L NC return to appropriate saturdation  Pain controlled, reports baseline upper abdomen discomfort  Tolerating TF, +Flatus, +BM     OBJECTIVE  Physical Exam:  Temp:  [35.6 C (96.1 F)-37.1 C (98.8 F)] 36 C (96.8 F)  Heart Rate:  [88-96] 92  Resp:  [16] 16  BP: (102-130)/(50-82) 120/60   GEN: no apparent distress  HEENT: NCAT, face symmetric  CHEST: nonlabored respirations  ABD: soft, distended, tender surrounding G-tube that is CDI, Perc Drains in hepatic abscess with bilious drainage  NEURO/MOTOR: alert, appropriate  EXTREMITIES: atraumatic  VASCULAR: feet wwp, no edema    Intake/Output:  05/08 0700 - 05/09 0659  In: 2286.1 [P.O.:30; I.V.:211.9]  Out: 9485 [Urine:1350; Drains:35]     Medications:  Current Facility-Administered Medications   Medication    TPN (2 in 1) Adult Continuous    ertapenem (INVANZ) IV 1,000 mg    bisacodyl (DULCOLAX) suppository 10 mg    levothyroxine (SYNTHROID, LEVOTHROID) tablet 137 mcg    DULoxetine (CYMBALTA) DR capsule 20 mg    methadone (DOLOPHINE) tablet 7.5 mg    insulin lispro (HumaLOG,ADMELOG) injection 0-20 Units    heparin 100 units/ml infusion    linezolid (ZYVOX) IVPB 600 mg    caspofungin (CANCIDAS) 50 mg in sodium chloride 0.9% 100 mL mini-bag    acetaminophen (TYLENOL) suppository 650 mg    LORazepam (ATIVAN) 2 mg/mL injection 0.25 mg    HYDROmorphone (DILAUDID) injection 1 mg    dextrose (GLUTOSE) 40 % oral gel 15 g    And    dextrose 50% (0.5 g/mL) injection 25 g    And    glucagon (GLUCAGEN) injection 1 mg    naloxone (NARCAN) 0.4 mg/mL injection 0.1 mg    HYDROmorphone (DILAUDID) PCA 1 mg/mL    mineral oil-hydrophilic petrolatum (AQUAPHOR) ointment    sodium chloride 0.9 % flush 10 mL    sodium chloride 0.9 % flush 10 mL    sodium chloride 0.9 %  FLUSH REQUIRED IF PATIENT HAS IV    dextrose 5 % FLUSH REQUIRED IF PATIENT HAS IV    lidocaine (LIDODERM) 5 % patch 1 patch    lidocaine (LIDODERM) 5 % patch 1 patch    ondansetron (ZOFRAN) injection 4 mg     Facility-Administered Medications Ordered in Other Encounters   Medication    etomidate (AMIDATE) 2 mg/mL injection    rocuronium (ZEMURON) 10 mg/mL injection         Laboratory values:   Recent Labs      07/11/17   0219  07/10/17   0008   WBC  11.1*  8.3   Hemoglobin  7.3*  7.1*   Hematocrit  24*  23*   Platelets  435*  409*   INR  1.1  1.1     No components found with this basename: APTT, PT Recent Labs      07/11/17   0219  07/10/17   0008   Sodium  137  137   Potassium  3.7  3.4   Chloride  100  100   CO2  26  27   UN  21*  22*   Creatinine  0.40*  0.45*   Glucose  125*  116*   Calcium  8.4*  8.9   Magnesium  1.9  2.0   Phosphorus  2.5*  3.0    Recent Labs      07/11/17   0219  07/10/17   0008   AST  136*  84*   ALT  135*  103*   Alk Phos  633*  597*   Bilirubin,Total  <0.2  <0.2   Bilirubin,Direct  <0.2  <0.2     Recent Labs      07/11/17   0219  07/10/17   0008   Total Protein  6.6  6.7   Albumin  2.6*  2.7*     No results for input(s): AMY, LIP in the last 72 hours.   GLUCOSE:   Recent Labs      07/11/17   0546  07/10/17   2338  07/10/17   1741  07/10/17   1243  07/10/17   0645  07/10/17   0007  07/09/17   1921  07/09/17   1207  07/09/17   0557  07/08/17   2357   Glucose POCT  126*  139*  111*  116*  128*  109*  98  116*  122*  119*     Imaging: Chest Single Frontal View    Result Date: 07/11/2017  Bilateral pleural effusions. Pulmonary edema. Increasing opacity in the right lung which could represent atelectasis or infection. END OF IMPRESSION I have personally reviewed the images and the Resident's/Fellow's interpretation and agree with or edited the findings. [facility] submits this DICOM format image data and final report to the [facility], an independent secure electronic health information  exchange, on a reciprocally searchable basis (with patient authorization) for a minimum of 12 months after exam date.       ASSESSMENT  Amanda Galloway is a 72 y.o. female with h/o recent PE and pancreatic cancer POD # 102  status post whipple procedure complicated by delayed gastric emptying.  NGT replaced on 04/04/17. UGI on 04/12/17 concerning for obstruction just beyond the [place] in the efferent limb.  Course complicated on 0/96/04 by rising LFTs and sepsis with CT concerning for cholangitis given biliary tree obstruction and PV compression due to hematoma and afferent limb distention in setting of PE treatment. Is s/p IR PTC on 04/16/17 and IR percutaneous aspiration of anterior abdominal fluid collection on 04/24/17. IR LUQ perc drain placement 3/1. IR anterior abdominal 30f perc drain placement 3/3. IR anterior perc drain into left hepatic collection 3/6.     PLAN  Diet tube feeding with tray ([facility] Full liquids   TPN (2 in 1) Adult Continuous diet   Analgesia with PCA, Methadone, managed by palliative care   Continue Antibiotics per ID, follow up cultures   Home meds   OOB/ambulate   Dispo: SNF. Will discuss with Social Work.     Author: DKathrin Greathouse MD as of: 07/11/2017  at: 6:48 AM         [facility]-GI Attending Addendum:   I personally examined the patient, reviewed the notes, and discussed the plan of care with the residents and patient. Agree with detailed resident's note. Please see the note above for details of history, exam, labs, assessment/plan which reflect my input.     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   Stable fluid collection. Leakage resolved. Minimal  output from drain. Will remove today.   Attempting dietary advance. Tube feedings at goal. Begin cycling.   Sepsis resolved.  Antibiotics altered.   Hyperglycemia with insulin sliding scale and recurrent need for insulin. continue   Post operative anemia. Stable.  Stable. No need for transfusion.  Pulmonary embolism. Present on admission. Heparin drip once stable for treatment. Holding for procedures.   Hypoalbuminemia. Sever protein calorie malnutrition.  Can stop TPN now that tube feedings at goal.   Depression. Ritalin helpful. Daily encouragement and reinforcement of goals.   Constipation . Aggressive regimen.   Physical debility. Requires rehabilitation setting.   Pain control. Increase PCA.       Delano Metz, MD, FACS  Hepatobiliary, Pancreas & GI Surgery

## 2017-07-12 LAB — CBC AND DIFFERENTIAL
Baso # K/uL: 0.1 10*3/uL (ref 0.0–0.1)
Basophil %: 0.6 %
Eos # K/uL: 0.6 10*3/uL — ABNORMAL HIGH (ref 0.0–0.4)
Eosinophil %: 5.8 %
Hematocrit: 22 % — ABNORMAL LOW (ref 34–45)
Hemoglobin: 6.7 g/dL — ABNORMAL LOW (ref 11.2–15.7)
IMM Granulocytes #: 0.3 10*3/uL
IMM Granulocytes: 2.4 %
Lymph # K/uL: 1.5 10*3/uL (ref 1.2–3.7)
Lymphocyte %: 14 %
MCH: 28 pg/cell (ref 26–32)
MCHC: 30 g/dL — ABNORMAL LOW (ref 32–36)
MCV: 93 fL (ref 79–95)
Mono # K/uL: 1 10*3/uL — ABNORMAL HIGH (ref 0.2–0.9)
Monocyte %: 8.8 %
Neut # K/uL: 7.4 10*3/uL — ABNORMAL HIGH (ref 1.6–6.1)
Nucl RBC # K/uL: 0 10*3/uL (ref 0.0–0.0)
Nucl RBC %: 0.1 /100 WBC (ref 0.0–0.2)
Platelets: 426 10*3/uL — ABNORMAL HIGH (ref 160–370)
RBC: 2.4 MIL/uL — ABNORMAL LOW (ref 3.9–5.2)
RDW: 17.9 % — ABNORMAL HIGH (ref 11.7–14.4)
Seg Neut %: 68.4 %
WBC: 10.9 10*3/uL — ABNORMAL HIGH (ref 4.0–10.0)

## 2017-07-12 LAB — SEDIMENTATION RATE, AUTOMATED: Sedimentation Rate: >=130 mm/hr — AB (ref 0–30)

## 2017-07-12 LAB — CRP: CRP: 46 mg/L — ABNORMAL HIGH (ref 0–10)

## 2017-07-12 LAB — BASIC METABOLIC PANEL
Anion Gap: 9 (ref 7–16)
CO2: 29 mmol/L — ABNORMAL HIGH (ref 20–28)
Calcium: 8.7 mg/dL (ref 8.6–10.2)
Chloride: 102 mmol/L (ref 96–108)
Creatinine: 0.44 mg/dL — ABNORMAL LOW (ref 0.51–0.95)
GFR,Black: 117 *
GFR,Caucasian: 102 *
Glucose: 98 mg/dL (ref 60–99)
Lab: 21 mg/dL — ABNORMAL HIGH (ref 6–20)
Potassium: 3.6 mmol/L (ref 3.3–5.1)
Sodium: 140 mmol/L (ref 133–145)

## 2017-07-12 LAB — FUNGUS CULTURE

## 2017-07-12 LAB — PROTIME-INR
INR: 1.2 — ABNORMAL HIGH (ref 0.9–1.1)
Protime: 12.9 s (ref 10.0–12.9)

## 2017-07-12 LAB — POCT GLUCOSE
Glucose POCT: 104 mg/dL — ABNORMAL HIGH (ref 60–99)
Glucose POCT: 85 mg/dL (ref 60–99)
Glucose POCT: 95 mg/dL (ref 60–99)

## 2017-07-12 LAB — APTT: aPTT: 69 s — ABNORMAL HIGH (ref 25.8–37.9)

## 2017-07-12 LAB — HEPATIC FUNCTION PANEL
ALT: 107 U/L — ABNORMAL HIGH (ref 0–35)
AST: 87 U/L — ABNORMAL HIGH (ref 0–35)
Albumin: 2.6 g/dL — ABNORMAL LOW (ref 3.5–5.2)
Alk Phos: 549 U/L — ABNORMAL HIGH (ref 35–105)
Bilirubin,Direct: <0.2 mg/dL (ref 0.0–0.3)
Bilirubin,Total: <0.2 mg/dL (ref 0.0–1.2)
Total Protein: 6.3 g/dL (ref 6.3–7.7)

## 2017-07-12 LAB — MAGNESIUM: Magnesium: 1.9 mg/dL (ref 1.6–2.5)

## 2017-07-12 LAB — PHOSPHORUS: Phosphorus: 3.3 mg/dL (ref 2.7–4.5)

## 2017-07-12 MED ORDER — HYDROMORPHONE HCL 2 MG/ML IJ SOLN *WRAPPED*
1.0000 mg | Freq: Once | INTRAMUSCULAR | Status: DC
Start: 2017-07-12 — End: 2017-07-12

## 2017-07-12 MED ORDER — BEN GAY GREASELESS EX *WRAPPED*
TOPICAL_CREAM | Freq: Four times a day (QID) | CUTANEOUS | Status: DC | PRN
Start: 2017-07-12 — End: 2017-08-24
  Filled 2017-07-12 (×3): qty 85

## 2017-07-12 MED ORDER — SODIUM CHLORIDE 0.9 % IV BOLUS *I*
1000.0000 mL | Freq: Once | Status: AC
Start: 2017-07-12 — End: 2017-07-12
  Administered 2017-07-12: 1000 mL via INTRAVENOUS

## 2017-07-12 NOTE — Progress Notes (Signed)
Palliative Care Progress Note    Subjective: " The pain can still get bad, but I am not needing to push the button so much. It is bad in my joints (knees) in AM"    She would be willing to try Ben-gay or topical med for joint pain that she used at home. Feels that methadone has helped with pain but still can be severe at times and wants to have PCA.    She had bout of vomiting yesterday after TF rate was increased.    Palliative Care ROS:  Pain   Severe  Nausea   Mild  Anorexia   Moderate  Anxiety   Moderate  Depression   Mild  Shortness of Breath   None  Tiredness/Fatigue   Moderate  Drowsiness/Sleepiness   Mild  Airway Secretions   no  Constipation   no  Delirium   no    Physical Examination:   BP: (96-132)/(54-70)   Temp:  [36 C (96.8 F)-36.3 C (97.3 F)]   Temp src: Temporal (05/10 0829)  Heart Rate:  [40-95]   Resp:  [16-18]   SpO2:  [94 %-96 %]   BP 122/68 (BP Location: Right arm)    Pulse 98    Temp 36.5 C (97.7 F) (Temporal)    Resp 16    Ht 1.702 m (5\' 7" )    Wt 78.6 kg (173 lb 4.8 oz)    SpO2 93%    BMI 27.14 kg/m   General appearance: alert, fatigued and mild distress  Throat: lips, mucosa, and tongue normal; teeth and gums normal  Lungs: clear to auscultation bilaterally, O2 NC  Heart: regular rate and rhythm  Abdomen: round distended, +BS, tol TFs via PEG, R side abd perc drain (scant pale green drainage) & new R side perc drain w/ red serosang drainage  Extremities: BIL LE edema, enlarged knee joints OA, generalized joint / muscular pain from fibromyalgia  Neurologic: Grossly normal, interactive and able to recall details of her care. Sx anxiety when talking about pain and illness.     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 Abd abscesses w/ drains in place, advancing diet, tolerating po meds (methadone and  cymbalta) will monitor for s/s serotonin syndrome (no symptoms noted 07/12/17)).     Pt case discussed w/ SW, understand discussions re SNF placement have resumed.     Anticipate pt will need a week, or more, to transition completely off PCA d/t high tolerance and methadone restarted on 5/7. May be able to increase methadone soon if pain uncontroled.  She was vomiting yesterday and not clear if she retained oral medications. May need additional time for methadone to peak effect.    Pain/dyspnea  Dilaudid PCA  1 mg IV Q 15 min prn 25 bolus on 5/9, for total of 27 mg in 24 hrs.- this is less than previously  (anticipate will use more PRN over next few days while waiting for methadone to take effect)  Dilaudid 1 mg IV Q 2hrs prn available for RN to give above PCA if warranted (x 1 on 5/8)   Methadone 7.5 mg po TID   Lidocaine patch daily x 2 (abd, LUE)  Acetaminophen 650 mg pr Q 4 hrs prn fever (x 1 on 5/5)    Anxiety/agitation/nausea/fatigue  Cymbalta 20 mg po/d restarted (watch for seritonin syndrome w/ linezolid which you would expect to see in next day or two w/ symptoms of tremor, diarrhea, diaphoresis or hemodynamic instability)    Aware pt w/ fibromyalgia, may consider lyrica in future after discuss with pharmacy if interaction with Linezolid..    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 No med changes for today, reiterated that pt likely will need to use PCA while waiting for methadone to take effect.    We will continue to follow along. Please call if questions/concerns.  Plan for re-assess on 5/13, unless called by team for concerns.      Total Time Spent 35 minutes:   >50% of time was spent in counseling and/or coordination of care.       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    Anemia D64.9    Hypothyroidism E03.9    Pancreatic cancer C25.9    Anemia D64.9     hx of Pulmonary embolism I26.99    Acute pulmonary insufficiency J98.4    Depression F32.9    Sepsis A41.9       No Known Allergies (drug, envir, food or latex)    Scheduled Meds:    ertapenem  1,000 mg Intravenous Q24H    metoclopramide IV  10 mg Intravenous Q6H    bisacodyl  10 mg Rectal Daily    levothyroxine  137 mcg Oral Daily @ 0600    DULoxetine  20 mg Oral Q24H    methadone  7.5 mg Oral Q8H    insulin lispro  0-20 Units Subcutaneous Q6H    linezolid  600 mg Intravenous Q12H    caspofungin  50 mg Intravenous Q24H    lidocaine  1 patch Transdermal Q24H    lidocaine  1 patch Transdermal Q24H       Continuous Infusions:    sodium chloride 50 mL/hr (07/12/17 0643)    heparin 1,350 Units/hr (07/12/17 0643)    HYDROmorphone         PRN Meds:  acetaminophen, LORazepam, HYDROmorphone PF, Nursing communication- Give 4 OZ of fruit juice for BG < 70 mg/dl **AND** dextrose **AND** dextrose **AND** glucagon **AND** POCT glucose, naloxone, mineral oil-hydrophilic petrolatum, sodium chloride, sodium chloride, sodium chloride, dextrose,  ondansetron

## 2017-07-12 NOTE — Plan of Care (Signed)
Problem: Safety  Goal: Patient will remain free of falls  Outcome: Maintaining      Problem: Pain/Comfort  Goal: Patient's pain or discomfort is manageable  Outcome: Maintaining    Goal: Patient's pain or discomfort is manageable  Outcome: Progressing towards goal      Problem: Mobility  Goal: Functional status is maintained or improved - Geriatric  Outcome: Progressing towards goal    Goal: Patient's functional status is maintained or improved  Outcome: Progressing towards goal      Problem: Nutrition  Goal: Patient's nutritional status is maintained or improved  Outcome: Progressing towards goal    Goal: Nutritional status is maintained or improved - Geriatric  Outcome: Progressing towards goal      Problem: Bowel Elimination  Goal: Elimination patterns are normal or improving  Outcome: Progressing towards goal      Problem: Post-Operative Bowel Elimination  Goal: Elimination pattern is normal or improving  Outcome: Progressing towards goal      Problem: Psychosocial  Goal: Demonstrates ability to cope with illness  Outcome: Progressing towards goal      Problem: Fluid and Electrolyte Imbalance  Goal: Fluid and Electrolyte imbalance  Outcome: Progressing towards goal      Problem: GI Bleeding Elimination  Goal: Elimination of patterns are normal or improving  Outcome: Progressing towards goal

## 2017-07-12 NOTE — Progress Notes (Addendum)
07/12/17 0829 07/12/17 0830 07/12/17 0843   Oxygen Therapy   SpO2 95 % (!) 89 % 95 %   O2 Therapy Nasal cannula None (Room air) Nasal cannula   O2 Flow Rate 1 L/min --  1 L/min   Attempted weaning O2 to room air. Pt could not keep SpO2 in required range. IS encouraged and used when prompted. Pt OOB to chair. Coughing and deep breathing encouraged. Will continue to monitor.    Low urine output this shift (100cc). Bladder scanned for 107 cc. Pt denies the urge to void or discomfort. Poor PO intake. MIVF @ 50 ml/hr, TF @ 30 ml/hr w/ 30 cc FWF q4. [place] notified. 1000 cc NS bolus ordered and administered. Next shift made aware. Will continue to monitor.     Carleene Cooper, RN

## 2017-07-12 NOTE — Progress Notes (Signed)
Surgical Oncology Surgery Progress Note    Patient: Amanda Galloway    LOS: 105 days    Attending: [place]  CT obtained yesterday shows reduced hepatic collection with drainage catheter, perihepatic collections are increased, perigastric collections increased.  Pain controlled, reports baseline upper abdomen discomfort  Tolerating TF, +Flatus, +BM     OBJECTIVE  Physical Exam:  Temp:  [36 C (96.8 F)-36.3 C (97.3 F)] 36.2 C (97.2 F)  Heart Rate:  [40-95] 94  Resp:  [16-18] 16  BP: (96-132)/(54-70) 110/68   GEN: no apparent distress  HEENT: NCAT, face symmetric  CHEST: nonlabored respirations  ABD: soft, distended, tender surrounding G-tube that is CDI, Perc Drains in hepatic abscess with bilious drainage  NEURO/MOTOR: alert, appropriate  EXTREMITIES: atraumatic  VASCULAR: feet wwp, no edema    Intake/Output:  05/09 0700 - 05/10 0659  In: 3784.8 [P.O.:600; I.V.:1167.6]  Out: 2200 [Urine:1550; Drains:75]     Medications:  Current Facility-Administered Medications   Medication    sodium chloride 0.9% IV    ertapenem (INVANZ) IV 1,000 mg    metoclopramide (REGLAN) injection 10 mg    bisacodyl (DULCOLAX) suppository 10 mg    levothyroxine (SYNTHROID, LEVOTHROID) tablet 137 mcg    DULoxetine (CYMBALTA) DR capsule 20 mg    methadone (DOLOPHINE) tablet 7.5 mg    insulin lispro (HumaLOG,ADMELOG) injection 0-20 Units    heparin 100 units/ml infusion    linezolid (ZYVOX) IVPB 600 mg    caspofungin (CANCIDAS) 50 mg in sodium chloride 0.9% 100 mL mini-bag    acetaminophen (TYLENOL) suppository 650 mg    LORazepam (ATIVAN) 2 mg/mL injection 0.25 mg    HYDROmorphone (DILAUDID) injection 1 mg    dextrose (GLUTOSE) 40 % oral gel 15 g    And    dextrose 50% (0.5 g/mL) injection 25 g    And    glucagon (GLUCAGEN) injection 1 mg    naloxone (NARCAN) 0.4 mg/mL injection 0.1 mg    HYDROmorphone (DILAUDID) PCA 1 mg/mL    mineral oil-hydrophilic petrolatum (AQUAPHOR) ointment     sodium chloride 0.9 % flush 10 mL    sodium chloride 0.9 % flush 10 mL    sodium chloride 0.9 % FLUSH REQUIRED IF PATIENT HAS IV    dextrose 5 % FLUSH REQUIRED IF PATIENT HAS IV    lidocaine (LIDODERM) 5 % patch 1 patch    lidocaine (LIDODERM) 5 % patch 1 patch    ondansetron (ZOFRAN) injection 4 mg     Facility-Administered Medications Ordered in Other Encounters   Medication    etomidate (AMIDATE) 2 mg/mL injection    rocuronium (ZEMURON) 10 mg/mL injection         Laboratory values:   Recent Labs      07/12/17   0029  07/11/17   0219   WBC  10.9*  11.1*   Hemoglobin  6.7*  7.3*   Hematocrit  22*  24*   Platelets  426*  435*   INR  1.2*  1.1   Sedimentation Rate  >=130*   --    CRP  46*   --      No components found with this basename: APTT, PT Recent Labs      07/12/17   0029  07/11/17   0219   Sodium  140  137   Potassium  3.6  3.7   Chloride  102  100   CO2  29*  26   UN  21*  21*   Creatinine  0.44*  0.40*   Glucose  98  125*   Calcium  8.7  8.4*   Magnesium  1.9  1.9   Phosphorus  3.3  2.5*    Recent Labs      07/11/17   0219  07/10/17   0008   AST  136*  84*   ALT  135*  103*   Alk Phos  633*  597*   Bilirubin,Total  <0.2  <0.2   Bilirubin,Direct  <0.2  <0.2     Recent Labs      07/11/17   0219  07/10/17   0008   Total Protein  6.6  6.7   Albumin  2.6*  2.7*     No results for input(s): AMY, LIP in the last 72 hours.   GLUCOSE:   Recent Labs      07/12/17   0608  07/12/17   0007  07/11/17   1718  07/11/17   1224  07/11/17   0546  07/10/17   2338  07/10/17   1741  07/10/17   1243  07/10/17   0645  07/10/17   0007   Glucose POCT  85  95  143*  134*  126*  139*  111*  116*  128*  109*     Imaging: Ct Abdomen And Pelvis With Iv Contrast    Result Date: 07/11/2017  Status post Whipple procedure for pancreatic carcinoma. Hepatic collections are reduced with new drainage catheter. Small perihepatic collections are increased. Perigastric collections are increased with removal of previously present drainage  catheter. LEFT hemidiaphragm collection stable. END OF IMPRESSION [facility] submits this DICOM format image data and final report to the [facility], an independent secure electronic health information exchange, on a reciprocally searchable basis (with patient authorization) for a minimum of 12 months after exam date.    Chest Single Frontal View    Result Date: 07/11/2017  Bilateral pleural effusions. Pulmonary edema. Increasing opacity in the right lung which could represent atelectasis or infection. END OF IMPRESSION I have personally reviewed the images and the Resident's/Fellow's interpretation and agree with or edited the findings. [facility] submits this DICOM format image data and final report to the [facility], an independent secure electronic health information exchange, on a reciprocally searchable basis (with patient authorization) for a minimum of 12 months after exam date.       ASSESSMENT  Amanda Galloway is a 72 y.o. female with h/o recent PE and pancreatic cancer POD # 102  status post whipple procedure complicated by delayed gastric emptying.  NGT replaced on 04/04/17. UGI on 04/12/17 concerning for obstruction just beyond the [place] in the efferent limb.  Course complicated on 10/26/21 by rising LFTs and sepsis with CT concerning for cholangitis given biliary tree obstruction and PV compression due to hematoma and afferent limb distention in setting of PE treatment. Is s/p IR PTC on 04/16/17 and IR percutaneous aspiration of anterior abdominal fluid collection on 04/24/17. IR LUQ perc drain placement 3/1. IR anterior abdominal 52f perc drain placement 3/3. IR anterior perc drain into left hepatic collection 3/6.     PLAN   Diet tube feeding with tray ([facility] Full liquids diet to restart   Vent G-tube PRN nausea   Analgesia with PCA, Methadone, managed by palliative care   Continue Antibiotics per ID, follow up cultures   Home meds   OOB/ambulate  Dispo: SNF. Will discuss with Social Work.      Author: Kathrin Greathouse, MD as of: 07/12/2017  at: 6:50 AM

## 2017-07-12 NOTE — Progress Notes (Addendum)
Infectious Diseases (Team 3) Follow Up Note    Personally reviewed chart, labs, medications. Patient seen.     CC:  F/U VRE bacteremia, Liver and abdominal abscesses, s/p Enterobacter cloacae bacteremia, Intra-abdominal abscesses positive for VRE, enterobacter and candida glabrata    Subjective: Endorses generalized pain and stiffness, denies N/V today (vomited yesterday with increase in TF rate, rate decreased now), denies diarrhea, denies F/C, rashes, or SOB (adds "but I haven't really gotten up today").  Smiled and joked a bit during visit.  Was asking about a news story on the television, seems less focused on pain during my visit with her today.     ROS: Reviewed 6 systems in detail, see above    Current Meds:  Scheduled Meds:   ertapenem  1,000 mg Intravenous Q24H    metoclopramide IV  10 mg Intravenous Q6H    bisacodyl  10 mg Rectal Daily    levothyroxine  137 mcg Oral Daily @ 0600    DULoxetine  20 mg Oral Q24H    methadone  7.5 mg Oral Q8H    insulin lispro  0-20 Units Subcutaneous Q6H    linezolid  600 mg Intravenous Q12H    caspofungin  50 mg Intravenous Q24H    lidocaine  1 patch Transdermal Q24H    lidocaine  1 patch Transdermal Q24H     Continuous Infusions:   sodium chloride 50 mL/hr (07/12/17 1000)    heparin 1,350 Units/hr (07/12/17 1000)    HYDROmorphone       PRN Meds:.   calcium carbonate  1,000 mg Oral BID PRN    HYDROmorphone PF  0.5 mg Intravenous Q1H PRN    Or    HYDROmorphone PF  1 mg Intravenous Q1H PRN    LORazepam  0.5 mg Oral Q6H PRN    heparin lock flush  50 Units Intracatheter PRN    ondansetron  4 mg Intravenous Q6H PRN     Objective:  BP: (96-110)/(54-68)   Temp:  [36.1 C (97 F)-36.3 C (97.3 F)]   Temp src: Temporal (05/10 0829)  Heart Rate:  [40-95]   Resp:  [16-18]   SpO2:  [89 %-96 %]     General Appearance: ill appearing female, in bed watching television, seems a little less distressed today   HEENT: Sclera anicteric, MMM, OP clear  Pulm: diminished  bilaterally, WOB unlabored, 02 at 1 liter nc  CV: RRR, S1S2, no M/R/G, LE edema bilaterally  Abdomen:  While laying flat in bed seems softer in upper quads, TTP particularly at PEG site, RUQ perc drains x2 with clear green fluid in collection bags.   Extremities:WWP  Skin:Warm and dry; reddened skin b/l LEs unchanged  Neuro: alert and oriented to place and person, confused to time  Lines/Drains/Tubes: PICC LUE (4/11), PIV RW 5/7, PEG (4/12), 2 abd APDL drains as above (4/17, 5/3)      Recent Labs  Lab 07/12/17  0029 07/11/17  0219 07/10/17  0008   WBC 10.9* 11.1* 8.3   Hemoglobin 6.7* 7.3* 7.1*   Hematocrit 22* 24* 23*   Platelets 426* 435* 409*   Seg Neut % 68.4 68.3 60.8   Lymphocyte % 14.0 13.4 18.0   Monocyte % 8.8 8.9 10.7   Eosinophil % 5.8 6.1 8.0       Recent Labs  Lab 07/12/17  0029 07/11/17  0219 07/10/17  0008   Sodium 140 137 137   Potassium 3.6 3.7 3.4   CO2 29*  26 27   UN 21* 21* 22*   Creatinine 0.44* 0.40* 0.45*   Glucose 98 125* 116*   Calcium 8.7 8.4* 8.9         Lab results: 07/12/17  0029 07/11/17  0219 07/10/17  0008 07/08/17  0752 07/05/17  1714   Total Protein 6.3 6.6 6.7 6.3 6.3   Albumin 2.6* 2.6* 2.7* 2.6* 2.4*   ALT 107* 135* 103* 97* 57*   AST 87* 136* 84* 143* 55*   Alk Phos 549* 633* 597* 437* 422*   Bilirubin,Total <0.2 <0.2 <0.2 0.2 <0.2       Lab Results  Component Value Date/Time   CRP 46 (H) 07/12/2017 0029   CRP 154 (H) 07/05/2017 0913   CRP 125 (H) 06/28/2017 0020       Lab Results  Component Value Date/Time   Sedimentation Rate >=130 (!) 07/12/2017 0029   Sedimentation Rate 73 (H) 07/05/2017 0913   Sedimentation Rate 48 (H) 07/05/2017 0047       Micro:   1/25: Intraoperative bile: GS 0 pmns, no organisms, Cx: no growth  2/12: 1 set of blood cultures from the right Mediport (no peripheral bld was sent): positive for Enterobacter cloacae complex in 70 hrs, sensitive to zosyn.   2/18 abdominal GS had >25 PMNs, many GPC in pairs/chains, many GNB and cultures 4+ Enterobacter cloacae  complex. The cultures also grew 1+ Candida glabrata   2/21: BCx from the Right peripheral IV, Mediport, L IJ:  No growth  05/03/17: IR-guided I&D with drain placement of the left upper quadrant collection: GS > 25 PMNs, many GNB and GN diplococci, Cultures with 4+ Enterobacter cloacae complex (resistant to Zosyn and CTX), C. Albicans and C. Glabrata, and Candida tropicalis  05/04/17:    1 set of blood cultures from left IJ: VRE at 15.3 TTP. Daptomycin sensitivities: MIC 52mg/ml (dose dependent),  Linezolid   1 set from periphery: VRE and Enterobacter cloacae at 14.8 TTP   05/05/17:  IR-guided I&D with drain placement of the intrahepatic collection: GS 1-10 PMNs, very few GPC in pairs. Fungal cultures with Candida glabrata  05/05/17: Blood cultures from periphery, Mediport and IJ: no growth  05/04/17: Urine cultures obtained (for unclear reason): no growth  05/08/17: Abscess GS: >25 PMNs, many GPC in chains, few GNB. Aerobic cultures growing 2+ Enterobacter (ertapenem, Cipro, TMP/SMX-sensitive) and 3+VRE, Fungal: C. Glabrata (sens to caspo, vori and dose dependent sens to fluconazole)  06/06/17: 2 sets of BCx (1 set each from periphery and Mediport) - taken after restarting ertapenem: IVAD grew VRE ttp 18.2 hours,  Periphery (Left arm) grew VRE ttp 17.3.  06/07/17 Pleural fluid (left) GS 1-10 PMN's, 1-10 Nucleated white cells, No organisms seen and 1604 nucleated cells. No growth on culture.   06/07/17 Abdominal abscess: labs cancelled - no specimens received.   06/08/17: Mediport BC + for VRE in 23 hrs (R-Amp, R- tetracycline per lab, suppressed result, R- doxycycline;  S-Linezolid, S-Dapto dose dependent),  Right hand peripheral cx: No growth   06/10/17: Blood culture 2/2: 1 periphery (RHand), 1 from Mediport before removed: no growth  06/11/17: Blood Culture 2/2 (both periphery) - no growth   06/19/17:  Perigastric fluid #1:  GS 10-25 PMNs -  GPB, GNB, GPC in pairs. 1+ C. Glabrata (R- fluconazole, voriconazole, S- Caspofungin); 2+  Enterococcus faecium (R- amp, PenG, Vanco; S- Linezolid)  06/19/17:  Liver fluid collection:  GS: 0 PMNs, Fungal and AFB stains negative, 1+ enterococcus faecium (R-  Amp, PenG, Vanco, S- Dapto dose dependent, Linezolid)  06/19/17 : Perigastric fluid #2: GS: >25 PMNs, Gram negative bacilli; 3+ C. Glabrata.   07/04/17 U/A negative  07/04/17 Blood Cultures 3/3 (1 set PICC proximal port, 2 sets periphery): NGTD  07/04/17 MRSA amplification Nares: negative  07/05/17 Blood Culture 3/3 (2 PICC, 1 Periph) - NGTD  07/05/17 Liver Abscess (IR drain)- <1PMNs, GPC in prs and chains, 1 colony candida albicans (preliminary), 4+ Enterococcus faecium - two isolates with one resistant to Daptomycin, the other dose dependent.      Susceptibility      Enterococcus faecium (3)     MIC BP MIC     Ampicillin   Resistant     Daptomycin 8.0 mcg/mL Resistant      Linezolid 2.0 mcg/mL Sensitive      Penicillin-G   Resistant     Vancomycin   Resistant             Susceptibility      Enterococcus faecium (4)     MIC BP MIC     Ampicillin   Resistant     Daptomycin 4.0 mcg/mL Susceptible - Dose Dependent      Linezolid 2.0 mcg/mL Sensitive      Penicillin-G   Resistant     Vancomycin   Resistant             Narrative     LIVER ABSCESS      Specimen Collected: 07/05/17 17:29 Last Resulted: 07/10/17 12:07              Imaging/Other Relevant Diagnostics:   07/11/17 Abdominal CT with contrast:  FINDINGS: Moderate bilateral pleural effusion. Bilateral lower lobe atelectasis. Distended fluid-filled esophagus. Coarse   2 drainage catheters in liver, the new catheter is in the RIGHT lobe more centrally in the previously present collection is much reduced. There is several subcapsular collections along the RIGHT lobe largest 3 x 1 cm, new (series 4 image 74).  Spleen parenchyma, adrenal glands, kidneys unremarkable.  There is a 2 cm stable collection associated with the RIGHT hemidiaphragm anteriorly image 48. There is a almost resolved collection posteriorly involve  the stomach at site of prior drainage tube, approximately 2 cm diameter. There is a new 3 cm diameter collection medial to the stomach (image 78) adjacent to the previously drained perigastric collection.  Status post Whipple procedure atrophic or absent.  Borderline retroperitoneal lymphadenopathy, not changed.  Percutaneous gastrostomy tube in stomach. No bowel obstruction. Oral contrast has reached the colon.  Vessels unremarkable.  Hysterectomy. Bladder unremarkable. Small amount of free fluid dependently in the pelvis.  Extensive edema. Degenerative changes of the spine.  IMPRESSION: Status post Whipple procedure for pancreatic carcinoma.  Hepatic collections are reduced with new drainage catheter. Small perihepatic collections are increased. Perigastric collections are increased with removal of previously present drainage catheter. LEFT hemidiaphragm collection stable.    Assessment and Plan:  ID problem(s):   - s/p enterobacter cloacae bacteremia  - Multiple and extensive Intra-abdominal abscesses growing VRE, enterobacter and candida glabrata, C.albican  - VRE Bacteremia     72 y.o. female with h/o recent PE and pancreatic cancer, s/p whipple procedure (goal of cure), complicated by delayed gastric emptying, elevated LFTs and sepsis, malnutrition 2/2 early satiety and anorexia (requiring PEG), and multiple intraabdominal and hepatic abscesses and drains (see micro/cultures above).  See extensive history in writer's previous note, including most recent sepsis episode on 5/2 requiring ICU observation/fluids/increased O2 demands, no pressors  required, stabilized quickly, source was not definitively identified, returned to [facility] floor on 5/7.   She has had multiple antibiotics since her admission.  Currently she is on Ertapenem, Linezolid (one isolate of VRE now resistant to Dapto), and Caspofungin (C.glabrata resistant to fluconazole and vori).     Joslynn is afebrile with a mildly elevated white count today, will  monitor.  Her liver chemistries have down trended today, will continue to follow.  Abigial was not able to offer much in how she is feeling but appears a little less distressed today, smiling and even going a bit, still quite weak and clearly avoids moving, even small movements such as turning her head cause her discomfort. She endorses being very stiff and finds all movement painful.  There as been mention of potentially adding Lyrica in notes given fibromyalgia. Concern for adding pregabalin into her medication regimen is that given DDIs, it could potentially significantly put her at higher risk for serotonin syndrome and we have little else we can use to treat her VRE other than Linezolid at the juncture. Hopefully nonpharmacologic modalities can be continued to be utilized with PT/OT guidance (gentle massage, gentle passive ROM, and so forth).     Recommendations:   Continue ertapenem 1 gram daily for gram negatives/aerobes   Continue Linezolid IV 600 mg twice daily for VRE, watch closely for serotonin syndrome and myelosuppression   Continue Caspofungin 50 mg IV daily   Follow fungal culture sensitivities   Please follow daily CBC with diff for next few days to follow current trend   Please follow daily CMP for now given uptrend in liver chemistries   ID Team 3 will continue to follow along    Patient case and antibiotic plan discussed with ID attending Dr. William Hamburger.     Thank you for allowing [name] to participate in the care of this patient  Please call with questions/concerns.       Lonzo Candy, ANP-BC  Infectious Diseases, Team 3  Pager [phone]  Office Ph [phone]      ID Attending Addendum: reviewed all culture data and labs, as well as the CT AP result.  Also discussed with the surgical attending. The patient is felt to be cancer free.  The RUQ drain will be removed soon.  No drainage interventions are being considered at this time.  Will continue to treat with antibiotics for long course, as yet  undefined.  The patient is tolerating linezolid and we will continue to monitor for signs of toxicity.

## 2017-07-13 LAB — BASIC METABOLIC PANEL
Anion Gap: 11 (ref 7–16)
CO2: 26 mmol/L (ref 20–28)
Calcium: 8.9 mg/dL (ref 8.6–10.2)
Chloride: 98 mmol/L (ref 96–108)
Creatinine: 0.47 mg/dL — ABNORMAL LOW (ref 0.51–0.95)
GFR,Black: 115 *
GFR,Caucasian: 100 *
Glucose: 105 mg/dL — ABNORMAL HIGH (ref 60–99)
Lab: 14 mg/dL (ref 6–20)
Potassium: 3.7 mmol/L (ref 3.3–5.1)
Sodium: 135 mmol/L (ref 133–145)

## 2017-07-13 LAB — PROTIME-INR
INR: 1.2 — ABNORMAL HIGH (ref 0.9–1.1)
Protime: 13.3 s — ABNORMAL HIGH (ref 10.0–12.9)

## 2017-07-13 LAB — CBC AND DIFFERENTIAL
Baso # K/uL: 0.1 10*3/uL (ref 0.0–0.1)
Basophil %: 0.8 %
Eos # K/uL: 0.5 10*3/uL — ABNORMAL HIGH (ref 0.0–0.4)
Eosinophil %: 4.7 %
Hematocrit: 23 % — ABNORMAL LOW (ref 34–45)
Hemoglobin: 6.9 g/dL — ABNORMAL LOW (ref 11.2–15.7)
IMM Granulocytes #: 0.1 10*3/uL
IMM Granulocytes: 1.3 %
Lymph # K/uL: 1.4 10*3/uL (ref 1.2–3.7)
Lymphocyte %: 12.6 %
MCH: 28 pg/cell (ref 26–32)
MCHC: 30 g/dL — ABNORMAL LOW (ref 32–36)
MCV: 93 fL (ref 79–95)
Mono # K/uL: 0.9 10*3/uL (ref 0.2–0.9)
Monocyte %: 8 %
Neut # K/uL: 8.1 10*3/uL — ABNORMAL HIGH (ref 1.6–6.1)
Nucl RBC # K/uL: 0 10*3/uL (ref 0.0–0.0)
Nucl RBC %: 0.1 /100 WBC (ref 0.0–0.2)
Platelets: 471 10*3/uL — ABNORMAL HIGH (ref 160–370)
RBC: 2.4 MIL/uL — ABNORMAL LOW (ref 3.9–5.2)
RDW: 18.4 % — ABNORMAL HIGH (ref 11.7–14.4)
Seg Neut %: 72.6 %
WBC: 11.1 10*3/uL — ABNORMAL HIGH (ref 4.0–10.0)

## 2017-07-13 LAB — PHOSPHORUS: Phosphorus: 4.2 mg/dL (ref 2.7–4.5)

## 2017-07-13 LAB — FUNGUS CULTURE: Fungal Culture: 0

## 2017-07-13 LAB — MAGNESIUM: Magnesium: 1.8 mg/dL (ref 1.6–2.5)

## 2017-07-13 LAB — APTT: aPTT: 66.5 s — ABNORMAL HIGH (ref 25.8–37.9)

## 2017-07-13 MED ORDER — LORAZEPAM 2 MG/ML IJ SOLN *I*
0.5000 mg | Freq: Once | INTRAMUSCULAR | Status: AC
Start: 2017-07-14 — End: 2017-07-14
  Administered 2017-07-14: 0.5 mg via INTRAVENOUS
  Filled 2017-07-13: qty 1

## 2017-07-13 MED ORDER — LACTOBACILLUS RHAMNOSUS (GG) PO CAPS *I*
1.0000 | ORAL_CAPSULE | Freq: Every day | ORAL | Status: DC
Start: 2017-07-13 — End: 2017-08-24
  Administered 2017-07-13 – 2017-08-24 (×42): 1 via ORAL
  Filled 2017-07-13 (×45): qty 1

## 2017-07-13 NOTE — Progress Notes (Signed)
Surgical Oncology Surgery Progress Note    Patient: Amanda Galloway    LOS: 106 days    Attending: Ron Agee     INTERVAL HISTORY & SUBJECTIVE  Pain medication alterations, discussion with ID  Pain controlled, reports baseline upper abdomen discomfort  Tolerating TF, +Flatus, +BM     OBJECTIVE  Physical Exam:  Temp:  [35.9 C (96.6 F)-37 C (98.6 F)] 36.3 C (97.3 F)  Heart Rate:  [93-100] 93  Resp:  [16-18] 16  BP: (100-138)/(58-68) 100/60   GEN: no apparent distress  HEENT: NCAT, face symmetric  CHEST: nonlabored respirations  ABD: soft, distended, tender surrounding G-tube that is CDI, Perc Drains in hepatic abscess with drainage  NEURO/MOTOR: alert, appropriate  EXTREMITIES: atraumatic  VASCULAR: feet wwp, no edema    Intake/Output:  05/10 0700 - 05/11 0659  In: 3380.6 [P.O.:90; I.V.:1480.6]  Out: 800 [Urine:725; Drains:75]     Medications:  Current Facility-Administered Medications   Medication    methyl salicylate-menthol (BENGAY) topical cream    sodium chloride 0.9% IV    ertapenem (INVANZ) IV 1,000 mg    metoclopramide (REGLAN) injection 10 mg    bisacodyl (DULCOLAX) suppository 10 mg    levothyroxine (SYNTHROID, LEVOTHROID) tablet 137 mcg    DULoxetine (CYMBALTA) DR capsule 20 mg    methadone (DOLOPHINE) tablet 7.5 mg    heparin 100 units/ml infusion    linezolid (ZYVOX) IVPB 600 mg    caspofungin (CANCIDAS) 50 mg in sodium chloride 0.9% 100 mL mini-bag    acetaminophen (TYLENOL) suppository 650 mg    LORazepam (ATIVAN) 2 mg/mL injection 0.25 mg    HYDROmorphone (DILAUDID) injection 1 mg    naloxone (NARCAN) 0.4 mg/mL injection 0.1 mg    HYDROmorphone (DILAUDID) PCA 1 mg/mL    mineral oil-hydrophilic petrolatum (AQUAPHOR) ointment    sodium chloride 0.9 % flush 10 mL    sodium chloride 0.9 % flush 10 mL    sodium chloride 0.9 % FLUSH REQUIRED IF PATIENT HAS IV    dextrose 5 % FLUSH REQUIRED IF PATIENT HAS IV    lidocaine (LIDODERM) 5 % patch 1 patch    lidocaine (LIDODERM) 5 % patch  1 patch    ondansetron (ZOFRAN) injection 4 mg     Facility-Administered Medications Ordered in Other Encounters   Medication    etomidate (AMIDATE) 2 mg/mL injection    rocuronium (ZEMURON) 10 mg/mL injection         Laboratory values:   Recent Labs      07/13/17   0018  07/12/17   0029   WBC  11.1*  10.9*   Hemoglobin  6.9*  6.7*   Hematocrit  23*  22*   Platelets  471*  426*   INR  1.2*  1.2*   Sedimentation Rate   --   >=130*   CRP   --   46*     No components found with this basename: APTT, PT Recent Labs      07/13/17   0018  07/12/17   0029   Sodium  135  140   Potassium  3.7  3.6   Chloride  98  102   CO2  26  29*   UN  14  21*   Creatinine  0.47*  0.44*   Glucose  105*  98   Calcium  8.9  8.7   Magnesium  1.8  1.9   Phosphorus  4.2  3.3    Recent Labs  07/12/17   0029  07/11/17   0219   AST  87*  136*   ALT  107*  135*   Alk Phos  549*  633*   Bilirubin,Total  <0.2  <0.2   Bilirubin,Direct  <0.2  <0.2     Recent Labs      07/12/17   0029  07/11/17   0219   Total Protein  6.3  6.6   Albumin  2.6*  2.6*     No results for input(s): AMY, LIP in the last 72 hours.   GLUCOSE:   Recent Labs      07/12/17   1140  07/12/17   0608  07/12/17   0007  07/11/17   1718  07/11/17   1224  07/11/17   0546  07/10/17   2338  07/10/17   1741  07/10/17   1243   Glucose POCT  104*  85  95  143*  134*  126*  139*  111*  116*     Imaging: Ct Abdomen And Pelvis With Iv Contrast    Result Date: 07/11/2017  Status post Whipple procedure for pancreatic carcinoma. Hepatic collections are reduced with new drainage catheter. Small perihepatic collections are increased. Perigastric collections are increased with removal of previously present drainage catheter. LEFT hemidiaphragm collection stable. END OF IMPRESSION [facility] submits this DICOM format image data and final report to the [facility], an independent secure electronic health information exchange, on a reciprocally searchable basis (with patient authorization) for a  minimum of 12 months after exam date.    Chest Single Frontal View    Result Date: 07/11/2017  Bilateral pleural effusions. Pulmonary edema. Increasing opacity in the right lung which could represent atelectasis or infection. END OF IMPRESSION I have personally reviewed the images and the Resident's/Fellow's interpretation and agree with or edited the findings. [facility] submits this DICOM format image data and final report to the [facility], an independent secure electronic health information exchange, on a reciprocally searchable basis (with patient authorization) for a minimum of 12 months after exam date.       ASSESSMENT  Amanda Galloway is a 72 y.o. female with h/o recent PE and pancreatic cancer POD # 102  status post whipple procedure complicated by delayed gastric emptying.  NGT replaced on 04/04/17. UGI on 04/12/17 concerning for obstruction just beyond the [place] in the efferent limb.  Course complicated on 11/22/08 by rising LFTs and sepsis with CT concerning for cholangitis given biliary tree obstruction and PV compression due to hematoma and afferent limb distention in setting of PE treatment. Is s/p IR PTC on 04/16/17 and IR percutaneous aspiration of anterior abdominal fluid collection on 04/24/17. IR LUQ perc drain placement 3/1. IR anterior abdominal 50f perc drain placement 3/3. IR anterior perc drain into left hepatic collection 3/6.     PLAN   Diet tube feeding with tray ([facility] Full liquids diet to restart   Will coordinate stopping Heparin infusion, additional pain medications for drain removal tomorrow morning   Vent G-tube PRN nausea   Analgesia with PCA, Methadone, managed by palliative care   Continue Antibiotics per ID, follow up cultures   Home meds   OOB/ambulate   Dispo: SNF pending clinical improvment     Author: DKathrin Greathouse MD as of: 07/13/2017  at: 12:20 PM

## 2017-07-14 LAB — APTT
aPTT: 59.5 s — ABNORMAL HIGH (ref 25.8–37.9)
aPTT: 28.3 s (ref 25.8–37.9)
aPTT: 35.1 s (ref 25.8–37.9)

## 2017-07-14 MED ORDER — KETOROLAC TROMETHAMINE 30 MG/ML IJ SOLN *I*
15.0000 mg | Freq: Four times a day (QID) | INTRAMUSCULAR | Status: DC | PRN
Start: 2017-07-14 — End: 2017-07-15
  Administered 2017-07-14: 15 mg via INTRAVENOUS
  Filled 2017-07-14: qty 1

## 2017-07-14 MED ORDER — HEPARIN INFUSION 100 UNITS/ML (NEUR PROTOCOL) *I*
0.0000 [IU]/h | INTRAVENOUS | Status: DC
Start: 2017-07-14 — End: 2017-07-22
  Administered 2017-07-14: 1000 [IU]/h
  Administered 2017-07-14: 1200 [IU]/h via INTRAVENOUS
  Administered 2017-07-14: 1000 [IU]/h via INTRAVENOUS
  Administered 2017-07-14: 1200 [IU]/h
  Administered 2017-07-14 (×2): 1000 [IU]/h
  Administered 2017-07-14: 1200 [IU]/h via INTRAVENOUS
  Administered 2017-07-14: 1200 [IU]/h
  Administered 2017-07-14: 1000 [IU]/h
  Administered 2017-07-15: 1400 [IU]/h
  Administered 2017-07-15: 1400 [IU]/h via INTRAVENOUS
  Administered 2017-07-15: 1200 [IU]/h
  Administered 2017-07-15: 1400 [IU]/h via INTRAVENOUS
  Administered 2017-07-15 (×2): 1400 [IU]/h
  Administered 2017-07-15 (×2): 1200 [IU]/h
  Administered 2017-07-15: 1200 [IU]/h via INTRAVENOUS
  Administered 2017-07-15: 1200 [IU]/h
  Administered 2017-07-15: 1400 [IU]/h
  Administered 2017-07-15: 1200 [IU]/h via INTRAVENOUS
  Administered 2017-07-15: 1200 [IU]/h
  Administered 2017-07-16: 1400 [IU]/h
  Administered 2017-07-16: 1400 [IU]/h via INTRAVENOUS
  Administered 2017-07-16 (×3): 1400 [IU]/h
  Administered 2017-07-16: 1200 [IU]/h via INTRAVENOUS
  Administered 2017-07-16 (×3): 1400 [IU]/h
  Administered 2017-07-16: 1200 [IU]/h
  Administered 2017-07-16: 1400 [IU]/h
  Administered 2017-07-16: 1400 [IU]/h via INTRAVENOUS
  Administered 2017-07-16 – 2017-07-17 (×12): 1400 [IU]/h
  Administered 2017-07-17: 1400 [IU]/h via INTRAVENOUS
  Administered 2017-07-17 – 2017-07-18 (×10): 1400 [IU]/h
  Administered 2017-07-18: 1400 [IU]/h via INTRAVENOUS
  Administered 2017-07-18 – 2017-07-19 (×10): 1400 [IU]/h
  Administered 2017-07-19 (×2): 1200 [IU]/h
  Administered 2017-07-19 (×2): 1200 [IU]/h via INTRAVENOUS
  Administered 2017-07-19 (×2): 1200 [IU]/h
  Administered 2017-07-19: 1400 [IU]/h
  Administered 2017-07-19 (×2): 1200 [IU]/h
  Administered 2017-07-19 (×2): 1400 [IU]/h
  Administered 2017-07-19 (×5): 1200 [IU]/h
  Administered 2017-07-19 (×2): 1400 [IU]/h
  Administered 2017-07-19 (×2): 1200 [IU]/h
  Administered 2017-07-20: 1200 [IU]/h via INTRAVENOUS
  Administered 2017-07-20: 1200 [IU]/h
  Administered 2017-07-20: 1200 [IU]/h via INTRAVENOUS
  Administered 2017-07-20 – 2017-07-21 (×13): 1200 [IU]/h
  Administered 2017-07-21 (×2): 1200 [IU]/h via INTRAVENOUS
  Administered 2017-07-22: 1400 [IU]/h via INTRAVENOUS
  Administered 2017-07-22 (×3): 1400 [IU]/h
  Administered 2017-07-22: 1200 [IU]/h
  Administered 2017-07-22: 1400 [IU]/h
  Administered 2017-07-22: 1200 [IU]/h
  Filled 2017-07-14 (×13): qty 250

## 2017-07-14 NOTE — Progress Notes (Signed)
Surgical Oncology Surgery Progress Note    Patient: Amanda Galloway    LOS: 107 days    Attending: [place].    Pain controlled, patient feeling well this AM.  Tolerating TF, +Flatus, +BM     OBJECTIVE  Physical Exam:  Temp:  [36.3 C (97.3 F)-36.6 C (97.9 F)] 36.5 C (97.7 F)  Heart Rate:  [91-101] 96  Resp:  [16] 16  BP: (100-140)/(60-72) 138/70   GEN: no apparent distress  HEENT: NCAT, face symmetric  CHEST: nonlabored respirations  ABD: soft, distended, tender surrounding G-tube that is CDI, Perc Drains in hepatic abscess with drainage  NEURO/MOTOR: alert, appropriate  EXTREMITIES: atraumatic  VASCULAR: feet wwp, no edema    Intake/Output:  05/11 0700 - 05/12 0659  In: 3347.7 [P.O.:300; I.V.:1082.7]  Out: 830 [Urine:800; Drains:30]     Medications:  Current Facility-Administered Medications   Medication    lactobacillus rhamnosus (GG) (CULTURELLE) capsule 1 each    methyl salicylate-menthol (BENGAY) topical cream    sodium chloride 0.9% IV    ertapenem (INVANZ) IV 1,000 mg    metoclopramide (REGLAN) injection 10 mg    bisacodyl (DULCOLAX) suppository 10 mg    levothyroxine (SYNTHROID, LEVOTHROID) tablet 137 mcg    DULoxetine (CYMBALTA) DR capsule 20 mg    methadone (DOLOPHINE) tablet 7.5 mg    heparin 100 units/ml infusion    linezolid (ZYVOX) IVPB 600 mg    caspofungin (CANCIDAS) 50 mg in sodium chloride 0.9% 100 mL mini-bag    acetaminophen (TYLENOL) suppository 650 mg    LORazepam (ATIVAN) 2 mg/mL injection 0.25 mg    HYDROmorphone (DILAUDID) injection 1 mg    naloxone (NARCAN) 0.4 mg/mL injection 0.1 mg    HYDROmorphone (DILAUDID) PCA 1 mg/mL    mineral oil-hydrophilic petrolatum (AQUAPHOR) ointment    sodium chloride 0.9 % flush 10 mL    sodium chloride 0.9 % flush 10 mL    sodium chloride 0.9 % FLUSH REQUIRED IF PATIENT HAS IV    dextrose 5 % FLUSH REQUIRED IF PATIENT HAS IV    lidocaine (LIDODERM) 5 % patch 1 patch    lidocaine (LIDODERM)  5 % patch 1 patch    ondansetron (ZOFRAN) injection 4 mg     Facility-Administered Medications Ordered in Other Encounters   Medication    etomidate (AMIDATE) 2 mg/mL injection    rocuronium (ZEMURON) 10 mg/mL injection         Laboratory values:   Recent Labs      07/13/17   0018  07/12/17   0029   WBC  11.1*  10.9*   Hemoglobin  6.9*  6.7*   Hematocrit  23*  22*   Platelets  471*  426*   INR  1.2*  1.2*   Sedimentation Rate   --   >=130*   CRP   --   46*     No components found with this basename: APTT, PT Recent Labs      07/13/17   0018  07/12/17   0029   Sodium  135  140   Potassium  3.7  3.6   Chloride  98  102   CO2  26  29*   UN  14  21*   Creatinine  0.47*  0.44*   Glucose  105*  98   Calcium  8.9  8.7   Magnesium  1.8  1.9   Phosphorus  4.2  3.3  Recent Labs      07/12/17   0029   AST  87*   ALT  107*   Alk Phos  549*   Bilirubin,Total  <0.2   Bilirubin,Direct  <0.2     Recent Labs      07/12/17   0029   Total Protein  6.3   Albumin  2.6*     No results for input(s): AMY, LIP in the last 72 hours.   GLUCOSE:   Recent Labs      07/12/17   1140  07/12/17   0608  07/12/17   0007  07/11/17   1718  07/11/17   1224   Glucose POCT  104*  85  95  143*  134*     Imaging: Ct Abdomen And Pelvis With Iv Contrast    Result Date: 07/11/2017  Status post Whipple procedure for pancreatic carcinoma. Hepatic collections are reduced with new drainage catheter. Small perihepatic collections are increased. Perigastric collections are increased with removal of previously present drainage catheter. LEFT hemidiaphragm collection stable. END OF IMPRESSION [facility] submits this DICOM format image data and final report to the [facility], an independent secure electronic health information exchange, on a reciprocally searchable basis (with patient authorization) for a minimum of 12 months after exam date.       ASSESSMENT  Amanda Galloway is a 72 y.o. female with h/o recent PE and pancreatic cancer POD # 102  status post  whipple procedure complicated by delayed gastric emptying.  NGT replaced on 04/04/17. UGI on 04/12/17 concerning for obstruction just beyond the [place] in the efferent limb.  Course complicated on 11/26/39 by rising LFTs and sepsis with CT concerning for cholangitis given biliary tree obstruction and PV compression due to hematoma and afferent limb distention in setting of PE treatment. Is s/p IR PTC on 04/16/17 and IR percutaneous aspiration of anterior abdominal fluid collection on 04/24/17. IR LUQ perc drain placement 3/1. IR anterior abdominal 22f perc drain placement 3/3. IR anterior perc drain into left hepatic collection 3/6.     PLAN   Diet tube feeding with tray ([facility] Full liquids   Vent G-tube PRN nausea   Analgesia with PCA, Methadone, managed by palliative care   Continue Antibiotics per ID, follow up cultures   Home meds   OOB/ambulate   Dispo: SNF pending clinical improvment     Heparin infusion held this morning and patient pre-medicated for removal of Medial Hepatic abscess drain. Patient tolerated the procedure well and dressing placed.     Author: DKathrin Greathouse MD as of: 07/14/2017  at: 10:31 AM

## 2017-07-15 ENCOUNTER — Inpatient Hospital Stay: Payer: Medicare (Managed Care)

## 2017-07-15 LAB — CBC AND DIFFERENTIAL
Baso # K/uL: 0.1 10*3/uL (ref 0.0–0.1)
Basophil %: 0.6 %
Eos # K/uL: 0.6 10*3/uL — ABNORMAL HIGH (ref 0.0–0.4)
Eosinophil %: 6.3 %
Hematocrit: 21 % — ABNORMAL LOW (ref 34–45)
Hemoglobin: 6.4 g/dL — ABNORMAL LOW (ref 11.2–15.7)
IMM Granulocytes #: 0.1 10*3/uL
IMM Granulocytes: 0.5 %
Lymph # K/uL: 1.2 10*3/uL (ref 1.2–3.7)
Lymphocyte %: 12.3 %
MCH: 29 pg/cell (ref 26–32)
MCHC: 31 g/dL — ABNORMAL LOW (ref 32–36)
MCV: 94 fL (ref 79–95)
Mono # K/uL: 0.8 10*3/uL (ref 0.2–0.9)
Monocyte %: 7.9 %
Neut # K/uL: 6.9 10*3/uL — ABNORMAL HIGH (ref 1.6–6.1)
Nucl RBC # K/uL: 0 10*3/uL (ref 0.0–0.0)
Nucl RBC %: 0 /100 WBC (ref 0.0–0.2)
Platelets: 405 10*3/uL — ABNORMAL HIGH (ref 160–370)
RBC: 2.2 MIL/uL — ABNORMAL LOW (ref 3.9–5.2)
RDW: 18.6 % — ABNORMAL HIGH (ref 11.7–14.4)
Seg Neut %: 72.4 %
WBC: 9.5 10*3/uL (ref 4.0–10.0)

## 2017-07-15 LAB — BASIC METABOLIC PANEL
Anion Gap: 11 (ref 7–16)
CO2: 28 mmol/L (ref 20–28)
Calcium: 7.8 mg/dL — ABNORMAL LOW (ref 8.6–10.2)
Chloride: 97 mmol/L (ref 96–108)
Creatinine: 0.42 mg/dL — ABNORMAL LOW (ref 0.51–0.95)
GFR,Black: 119 *
GFR,Caucasian: 103 *
Glucose: 88 mg/dL (ref 60–99)
Lab: 11 mg/dL (ref 6–20)
Potassium: 3.8 mmol/L (ref 3.3–5.1)
Sodium: 136 mmol/L (ref 133–145)

## 2017-07-15 LAB — HEPATIC FUNCTION PANEL
ALT: 97 U/L — ABNORMAL HIGH (ref 0–35)
AST: 81 U/L — ABNORMAL HIGH (ref 0–35)
Albumin: 2.5 g/dL — ABNORMAL LOW (ref 3.5–5.2)
Alk Phos: 514 U/L — ABNORMAL HIGH (ref 35–105)
Bilirubin,Direct: <0.2 mg/dL (ref 0.0–0.3)
Bilirubin,Total: <0.2 mg/dL (ref 0.0–1.2)
Total Protein: 5.9 g/dL — ABNORMAL LOW (ref 6.3–7.7)

## 2017-07-15 LAB — APTT
aPTT: 35.6 s (ref 25.8–37.9)
aPTT: 63.4 s — ABNORMAL HIGH (ref 25.8–37.9)
aPTT: 51.7 s — ABNORMAL HIGH (ref 25.8–37.9)
aPTT: 60.1 s — ABNORMAL HIGH (ref 25.8–37.9)

## 2017-07-15 LAB — MAGNESIUM: Magnesium: 1.6 mg/dL (ref 1.6–2.5)

## 2017-07-15 LAB — PROTIME-INR
INR: 1.1 (ref 0.9–1.1)
Protime: 12.6 s (ref 10.0–12.9)

## 2017-07-15 LAB — PREALBUMIN: Prealbumin: 14 mg/dL — ABNORMAL LOW (ref 20–40)

## 2017-07-15 LAB — PHOSPHORUS: Phosphorus: 4.4 mg/dL (ref 2.7–4.5)

## 2017-07-15 LAB — TRIGLYCERIDES: Triglycerides: 122 mg/dL

## 2017-07-15 MED ORDER — METOCLOPRAMIDE HCL 5 MG/ML IJ SOLN *I*
5.0000 mg | Freq: Four times a day (QID) | INTRAMUSCULAR | Status: DC
Start: 2017-07-15 — End: 2017-07-16
  Administered 2017-07-15 – 2017-07-16 (×3): 5 mg via INTRAVENOUS
  Filled 2017-07-15 (×3): qty 2

## 2017-07-15 MED ORDER — KETOROLAC TROMETHAMINE 30 MG/ML IJ SOLN *I*
15.0000 mg | Freq: Four times a day (QID) | INTRAMUSCULAR | Status: DC | PRN
Start: 2017-07-15 — End: 2017-07-15

## 2017-07-15 MED ORDER — MAGNESIUM SULFATE 2 GM IN 50 ML *WRAPPED*
2000.0000 mg | Freq: Once | INTRAVENOUS | Status: AC
Start: 2017-07-15 — End: 2017-07-15
  Administered 2017-07-15: 2000 mg via INTRAVENOUS
  Filled 2017-07-15: qty 50

## 2017-07-15 MED ORDER — METHADONE HCL 10 MG PO TABS *I*
10.0000 mg | ORAL_TABLET | Freq: Two times a day (BID) | ORAL | Status: DC
Start: 2017-07-15 — End: 2017-07-17
  Administered 2017-07-15 – 2017-07-16 (×3): 10 mg via ORAL
  Filled 2017-07-15 (×3): qty 1

## 2017-07-15 MED ORDER — ALBUTEROL SULFATE (2.5 MG/3ML) 0.083% IN NEBU *I*
2.5000 mg | INHALATION_SOLUTION | Freq: Four times a day (QID) | RESPIRATORY_TRACT | Status: DC | PRN
Start: 2017-07-15 — End: 2017-08-20

## 2017-07-15 MED ORDER — POLYETHYLENE GLYCOL 3350 PO PACK 17 GM *I*
17.0000 g | PACK | Freq: Every day | ORAL | Status: DC
Start: 2017-07-16 — End: 2017-08-24
  Administered 2017-07-16 – 2017-08-22 (×18): 17 g via ORAL
  Filled 2017-07-15 (×29): qty 17

## 2017-07-15 MED ORDER — ONDANSETRON HCL 2 MG/ML IV SOLN *I*
4.0000 mg | Freq: Four times a day (QID) | INTRAMUSCULAR | Status: DC | PRN
Start: 2017-07-15 — End: 2017-07-18
  Administered 2017-07-16 – 2017-07-18 (×6): 4 mg via INTRAVENOUS
  Filled 2017-07-15 (×6): qty 2

## 2017-07-15 MED ORDER — BUTALBITAL-APAP-CAFFEINE 50-325-40 MG PO TABS *I*
1.0000 | ORAL_TABLET | Freq: Every day | ORAL | Status: DC | PRN
Start: 2017-07-15 — End: 2017-07-21

## 2017-07-15 MED ORDER — METHADONE HCL 5 MG PO TABS *I*
7.5000 mg | ORAL_TABLET | Freq: Every day | ORAL | Status: DC
Start: 2017-07-16 — End: 2017-07-17
  Administered 2017-07-16 – 2017-07-17 (×2): 7.5 mg via ORAL
  Filled 2017-07-15 (×2): qty 2

## 2017-07-15 MED ORDER — ACETAMINOPHEN 650 MG/20.3 ML WRAPPED *I*
650.0000 mg | Freq: Four times a day (QID) | Status: AC | PRN
Start: 2017-07-15 — End: 2017-07-29

## 2017-07-15 MED ORDER — SENNOSIDES 8.8 MG/5ML PO SYRP *I*
8.8000 mg | ORAL_SOLUTION | Freq: Every evening | ORAL | Status: DC
Start: 2017-07-15 — End: 2017-07-17
  Administered 2017-07-15 – 2017-07-16 (×2): 8.8 mg via ORAL
  Filled 2017-07-15 (×2): qty 5

## 2017-07-15 NOTE — Progress Notes (Signed)
Surgical Oncology Surgery Progress Note    Patient: Amanda Galloway    LOS: 108 days    Attending: [place]  Medial right drain removed yesterday, tolerated well.  NAEO.  Pain controlled, patient feeling well this AM.  Tolerating TF and minimal PO, +Flatus, +BM     OBJECTIVE  Physical Exam:  Temp:  [36.2 C (97.2 F)-36.9 C (98.4 F)] 36.9 C (98.4 F)  Heart Rate:  [91-96] 95  Resp:  [16] 16  BP: (112-132)/(60-74) 118/68   GEN: no apparent distress  HEENT: NCAT, face symmetric  CHEST: nonlabored respirations  ABD: soft, distended, tender surrounding G-tube that is CDI, Perc Drain in hepatic abscess with drainage  NEURO/MOTOR: alert, appropriate  EXTREMITIES: atraumatic  VASCULAR: feet wwp, no edema    Intake/Output:  05/12 0700 - 05/13 0659  In: 4262.4 [P.O.:210; I.V.:1432.4]  Out: 485 [Urine:450; Drains:35]     Medications:  Current Facility-Administered Medications   Medication    ondansetron (ZOFRAN) injection 4 mg    ketorolac (TORADOL) 30 mg/mL injection 15 mg    heparin 100 units/ml infusion    lactobacillus rhamnosus (GG) (CULTURELLE) capsule 1 each    methyl salicylate-menthol (BENGAY) topical cream    sodium chloride 0.9% IV    ertapenem (INVANZ) IV 1,000 mg    metoclopramide (REGLAN) injection 10 mg    bisacodyl (DULCOLAX) suppository 10 mg    levothyroxine (SYNTHROID, LEVOTHROID) tablet 137 mcg    DULoxetine (CYMBALTA) DR capsule 20 mg    methadone (DOLOPHINE) tablet 7.5 mg    linezolid (ZYVOX) IVPB 600 mg    caspofungin (CANCIDAS) 50 mg in sodium chloride 0.9% 100 mL mini-bag    acetaminophen (TYLENOL) suppository 650 mg    LORazepam (ATIVAN) 2 mg/mL injection 0.25 mg    HYDROmorphone (DILAUDID) injection 1 mg    naloxone (NARCAN) 0.4 mg/mL injection 0.1 mg    HYDROmorphone (DILAUDID) PCA 1 mg/mL    mineral oil-hydrophilic petrolatum (AQUAPHOR) ointment    sodium chloride 0.9 % flush 10 mL    sodium chloride 0.9 % flush 10 mL    sodium chloride  0.9 % FLUSH REQUIRED IF PATIENT HAS IV    dextrose 5 % FLUSH REQUIRED IF PATIENT HAS IV    lidocaine (LIDODERM) 5 % patch 1 patch    lidocaine (LIDODERM) 5 % patch 1 patch     Facility-Administered Medications Ordered in Other Encounters   Medication    etomidate (AMIDATE) 2 mg/mL injection    rocuronium (ZEMURON) 10 mg/mL injection         Laboratory values:   Recent Labs      07/15/17   0105  07/13/17   0018   WBC  9.5  11.1*   Hemoglobin  6.4*  6.9*   Hematocrit  21*  23*   Platelets  405*  471*   INR  1.1  1.2*     No components found with this basename: APTT, PT Recent Labs      07/15/17   0105  07/13/17   0018   Sodium  136  135   Potassium  3.8  3.7   Chloride  97  98   CO2  28  26   UN  11  14   Creatinine  0.42*  0.47*   Glucose  88  105*   Calcium  7.8*  8.9   Magnesium  1.6  1.8   Phosphorus  4.4  4.2  Recent Labs      07/15/17   0105   AST  81*   ALT  97*   Alk Phos  514*   Bilirubin,Total  <0.2   Bilirubin,Direct  <0.2     Recent Labs      07/15/17   0105   Total Protein  5.9*   Albumin  2.5*   Prealbumin  14*     No results for input(s): AMY, LIP in the last 72 hours.   GLUCOSE:   Recent Labs      07/12/17   1140   Glucose POCT  104*     Imaging: No results found.     ASSESSMENT  Amanda Galloway is a 72 y.o. female with h/o recent PE and pancreatic cancer POD # 102  status post whipple procedure complicated by delayed gastric emptying.  NGT replaced on 04/04/17. UGI on 04/12/17 concerning for obstruction just beyond the Toyah in the efferent limb.  Course complicated on 1/88/41 by rising LFTs and sepsis with CT concerning for cholangitis given biliary tree obstruction and PV compression due to hematoma and afferent limb distention in setting of PE treatment. Is s/p IR PTC on 04/16/17 and IR percutaneous aspiration of anterior abdominal fluid collection on 04/24/17. IR LUQ perc drain placement 3/1. IR anterior abdominal 68f perc drain placement 3/3. IR anterior perc drain into left hepatic collection 3/6.      PLAN  Diet tube feeding with tray ([facility] Full liquids   Vent G-tube PRN nausea   Analgesia with PCA, Methadone, managed by palliative care   Continue Antibiotics per ID, follow up cultures   Home meds   OOB/ambulate   Dispo: SNF pending clinical improvment     Author: DKathrin Greathouse MD as of: 07/15/2017  at: 10:30 AM

## 2017-07-15 NOTE — Progress Notes (Signed)
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 Abdominal abscesses w/ drains in place, advancing diet, tolerating po meds (methadone and cymbalta) . On last Thursday, patient experienced emesis and likely vagal response, TFs held temporarily. Repeat CT scan 5/9, s/p Whipple w hepatic collections reduced with new drainage catheters. Increased perigastric collections and stable left hemidiaphragm collection. Remains on triple abx therapy and diet advanced to fulls.     Significant 24h Events: Amanda Galloway continues to use considerable amount of PCA 31 mg in 24 hrs in addition to methadone 7.5 mg tid. Advancing diet to dysphagia 1. Poor po intake of lunch and TF not currently running.     Subjective: Amanda Galloway is sitting in chair, interactive but flat affect and not tearful. She is not showing signs of acute distress although she is rating her abdominal pain 7/10.     Palliative Care ROS: Denies nausea, vomiting, constipation, and shortness of breath. Stating her abdominal pain still 7/10 and worse with activity. Pain tends to wax and wane not constant.       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    Anemia D64.9    Hypothyroidism E03.9    Pancreatic cancer C25.9    Anemia D64.9     hx of Pulmonary embolism I26.99    Acute pulmonary insufficiency J98.4    Depression F32.9    Sepsis A41.9       No Known Allergies (drug, envir, food or latex)    Scheduled Meds:    sennosides  8.8 mg Oral Nightly    [START ON 07/16/2017] polyethylene glycol  17 g Oral Daily    lactobacillus rhamnosus (GG)  1 capsule Oral Daily    ertapenem  1,000 mg Intravenous Q24H    metoclopramide IV  10 mg  Intravenous Q6H    bisacodyl  10 mg Rectal Daily    levothyroxine  137 mcg Oral Daily @ 0600    DULoxetine  20 mg Oral Q24H    methadone  7.5 mg Oral Q8H    linezolid  600 mg Intravenous Q12H    caspofungin  50 mg Intravenous Q24H    lidocaine  1 patch Transdermal Q24H    lidocaine  1 patch Transdermal Q24H       Continuous Infusions:    heparin 1,400 Units/hr (07/15/17 0811)    sodium chloride 50 mL/hr (07/15/17 1306)    HYDROmorphone         PRN Meds:  ondansetron, ketorolac, albuterol, methyl salicylate-menthol, acetaminophen, LORazepam, HYDROmorphone PF, naloxone, mineral oil-hydrophilic petrolatum, sodium chloride, sodium chloride, sodium chloride, dextrose    Past 24h PRN Usage:  Dilaudid PCA 1 mg x 31 in 24 hrs.     Physical Examination:   BP: (110-128)/(60-70)   Temp:  [36.2 C (97.2 F)-36.9 C (98.4 F)]   Temp src: Temporal (05/13 1130)  Heart Rate:  [84-96]   Resp:  [16]   SpO2:  [91 %-94 %]   General appearance: Alert and interactive but affect flat.  Head: Sclera anicteric, MMM, normocephalic w/o trauma.  Lungs: respirations easy, regular rate  Heart: S1, S2 RRR  Abdomen: soft,  mild tender, Right perc drain with bilious drg. G tube site capped and benign  Extremities: Anasarca with pitting edema and areas of weeping BLE  Neurologic: Affect flat, Alert and oriented but some issues w memory recall at times.     Assessment/Plan: Amanda Galloway is a 72 yo female w pancreatic CA s/p Whipple in January and a complicated prolonged hospital course. Recently w recurrent infected abdominal abscesses, anxiety, and uncontrolled pain.     Abdominal Pain/Dyspnea/fibromyalgia  - issue w inability to view Dilaudid PCA prns in computer and needed to physically view in machine. (notified nursing). Total 31 mg IV dilaudid in 24 hrs, converts to 620 mg po morphine. Currently she is on methadone 7.5 mg tid (previous dose 20/20/15) Discussed with Dr Simona Huh, with her current use of IV dilaudid would recommend increase to  7.5mg  /10mg /10mg  po.   - Would decrease Dilaudid PCA to 0.7 mg q 15 min prn.  - recheck QTC today (last 5/2 392) and weekly  - continue ambulation/pulmonary toilet  - lido patch x 2 daily  - Tylenol 650 mg pr q 4 prn  - in past gabapentin and lyrica have been ineffective in treating her fibromyalgia    Anxiety/Agitation  - continue cymbalta 20 mg po every day (watch for seritonin syndrome w/ linezolid which you would expect to see in next day or two w/ symptoms of tremor, N/V/D, diaphoresis or hemodynamic instability)  - ativan 0.25 mg q 6 hrs prn  - continue to encourage alternate therapies for coping- aromatherapy, meditation, gentle massage    Nausea/fatigue  - f/u TSH on levothyroxine 137 mcg every day  - consider decreasing reglan to 5 mg or changing to prn  - zofran 4 mg IV prn    Constipation, last BM 5/13  - bisacodyl supp prn      [name] ANP  Palliative Care Service  [facility] #[id number]  07/15/2017 12:50 PM

## 2017-07-16 ENCOUNTER — Ambulatory Visit: Payer: Medicare (Managed Care) | Admitting: Infectious Diseases

## 2017-07-16 ENCOUNTER — Other Ambulatory Visit: Payer: Self-pay | Admitting: Cardiology

## 2017-07-16 LAB — EKG 12-LEAD
P: 47 deg
PR: 176 ms
QRS: 34 deg
QRSD: 70 ms
QT: 356 ms
QTc: 443 ms
Rate: 93 {beats}/min
Severity: BORDERLINE
Statement: BORDERLINE
T: 44 deg

## 2017-07-16 LAB — APTT
aPTT: 49.1 s — ABNORMAL HIGH (ref 25.8–37.9)
aPTT: 62.1 s — ABNORMAL HIGH (ref 25.8–37.9)
aPTT: 71.6 s — ABNORMAL HIGH (ref 25.8–37.9)
aPTT: 58.6 s — ABNORMAL HIGH (ref 25.8–37.9)

## 2017-07-16 MED ORDER — METOCLOPRAMIDE HCL 5 MG/ML IJ SOLN *I*
10.0000 mg | Freq: Four times a day (QID) | INTRAMUSCULAR | Status: DC
Start: 2017-07-16 — End: 2017-07-25
  Administered 2017-07-16 – 2017-07-25 (×36): 10 mg via INTRAVENOUS
  Filled 2017-07-16 (×36): qty 2

## 2017-07-16 MED ORDER — FUROSEMIDE 10 MG/ML IJ SOLN *I*
20.0000 mg | Freq: Once | INTRAMUSCULAR | Status: AC
Start: 2017-07-16 — End: 2017-07-16
  Administered 2017-07-16: 20 mg via INTRAVENOUS
  Filled 2017-07-16: qty 2

## 2017-07-16 MED ORDER — LORAZEPAM 2 MG/ML IJ SOLN *I*
0.2500 mg | Freq: Once | INTRAMUSCULAR | Status: AC
Start: 2017-07-16 — End: 2017-07-16
  Administered 2017-07-16: 0.25 mg via INTRAVENOUS

## 2017-07-16 NOTE — Plan of Care (Signed)
Problem: Safety  Goal: Patient will remain free of falls  Outcome: Progressing towards goal      Problem: Pain/Comfort  Goal: Patient's pain or discomfort is manageable  Outcome: Maintaining      Problem: Mobility  Goal: Functional status is maintained or improved - Geriatric  Outcome: Progressing towards goal      Problem: Nutrition  Goal: Patient's nutritional status is maintained or improved  Outcome: Progressing towards goal      Problem: Bowel Elimination  Goal: Elimination patterns are normal or improving  Outcome: Progressing towards goal      Problem: Psychosocial  Goal: Demonstrates ability to cope with illness  Outcome: Maintaining

## 2017-07-16 NOTE — Progress Notes (Addendum)
Infectious Diseases (Team 3) Follow Up Note    Personally reviewed chart, labs, medications. Patient seen.     CC:  F/U VRE bacteremia, Liver and abdominal abscesses, s/p Enterobacter cloacae bacteremia, Intra-abdominal abscesses positive for VRE, enterobacter and candida glabrata    Subjective: Nauseated after eating ice cream, sitting with emesis basin on lap, no vomiting.  Abdominal pain across upper abdomen. Endorses chills earlier, resolved with additional blankets.  No BM today (moved bowels yesterday)    ROS: Reviewed 6 systems in detail, see above    Current Meds:  Scheduled Meds:   metoclopramide IV  10 mg Intravenous Q6H    sennosides  8.8 mg Oral Nightly    polyethylene glycol  17 g Oral Daily    methadone  7.5 mg Oral Daily    And    methadone  10 mg Oral BID    lactobacillus rhamnosus (GG)  1 capsule Oral Daily    ertapenem  1,000 mg Intravenous Q24H    bisacodyl  10 mg Rectal Daily    levothyroxine  137 mcg Oral Daily @ 0600    DULoxetine  20 mg Oral Q24H    linezolid  600 mg Intravenous Q12H    caspofungin  50 mg Intravenous Q24H    lidocaine  1 patch Transdermal Q24H    lidocaine  1 patch Transdermal Q24H     Continuous Infusions:   heparin 1,400 Units/hr (07/16/17 1059)    HYDROmorphone       PRN Meds:.   calcium carbonate  1,000 mg Oral BID PRN    HYDROmorphone PF  0.5 mg Intravenous Q1H PRN    Or    HYDROmorphone PF  1 mg Intravenous Q1H PRN    LORazepam  0.5 mg Oral Q6H PRN    heparin lock flush  50 Units Intracatheter PRN    ondansetron  4 mg Intravenous Q6H PRN     Objective:  BP: (110-132)/(60-80)   Temp:  [36.1 C (97 F)-37.1 C (98.8 F)]   Temp src: Temporal (05/14 0816)  Heart Rate:  [84-99]   Resp:  [16]   SpO2:  [93 %-95 %]     General Appearance: sitting up in chair, appears uncomfortable with emesis basin on her lap,   HEENT: Sclera anicteric, MMM, OP clear  Pulm: diminished bilaterally, crackles left base, WOB unlabored, 02 at 1 liter nc   CV: RRR, S1S2, no  M/R/G, increased LE edema bilaterally today   Abdomen:  Distended/tympanic, TTP, PEG intact, one remaining right abdomenal perc drain with small amount clear bilious drainage in collection bag.   Extremities:WWP  Skin:Warm and dry; reddened flaking skin and what appears to be resolving blisters b/l LEs  Neuro: alert and oriented to place and person, confused to time  Lines/Drains/Tubes: PICC LUE (4/11), PEG (4/12), 1 Right flank/UQ APDL drains remains      Recent Labs  Lab 07/15/17  0105 07/13/17  0018 07/12/17  0029   WBC 9.5 11.1* 10.9*   Hemoglobin 6.4* 6.9* 6.7*   Hematocrit 21* 23* 22*   Platelets 405* 471* 426*   Seg Neut % 72.4 72.6 68.4   Lymphocyte % 12.3 12.6 14.0   Monocyte % 7.9 8.0 8.8   Eosinophil % 6.3 4.7 5.8       Recent Labs  Lab 07/15/17  0105 07/13/17  0018 07/12/17  0029   Sodium 136 135 140   Potassium 3.8 3.7 3.6   CO2 28 26 29*   UN  11 14 21*   Creatinine 0.42* 0.47* 0.44*   Glucose 88 105* 98   Calcium 7.8* 8.9 8.7         Lab results: 07/15/17  0105 07/12/17  0029 07/11/17  0219 07/10/17  0008 07/08/17  0752   Total Protein 5.9* 6.3 6.6 6.7 6.3   Albumin 2.5* 2.6* 2.6* 2.7* 2.6*   ALT 97* 107* 135* 103* 97*   AST 81* 87* 136* 84* 143*   Alk Phos 514* 549* 633* 597* 437*   Bilirubin,Total <0.2 <0.2 <0.2 <0.2 0.2       Lab Results  Component Value Date/Time   CRP 46 (H) 07/12/2017 0029   CRP 154 (H) 07/05/2017 0913   CRP 125 (H) 06/28/2017 0020       Lab Results  Component Value Date/Time   Sedimentation Rate >=130 (!) 07/12/2017 0029   Sedimentation Rate 73 (H) 07/05/2017 0913   Sedimentation Rate 48 (H) 07/05/2017 0047       Micro:   1/25: Intraoperative bile: GS 0 pmns, no organisms, Cx: no growth  2/12: 1 set of blood cultures from the right Mediport (no peripheral bld was sent): positive for Enterobacter cloacae complex in 70 hrs, sensitive to zosyn.   2/18 abdominal GS had >25 PMNs, many GPC in pairs/chains, many GNB and cultures 4+ Enterobacter cloacae complex. The cultures also grew  1+ Candida glabrata   2/21: BCx from the Right peripheral IV, Mediport, L IJ:  No growth  05/03/17: IR-guided I&D with drain placement of the left upper quadrant collection: GS > 25 PMNs, many GNB and GN diplococci, Cultures with 4+ Enterobacter cloacae complex (resistant to Zosyn and CTX), C. Albicans and C. Glabrata, and Candida tropicalis  05/04/17:    1 set of blood cultures from left IJ: VRE at 15.3 TTP. Daptomycin sensitivities: MIC 76mg/ml (dose dependent),  Linezolid   1 set from periphery: VRE and Enterobacter cloacae at 14.8 TTP   05/05/17:  IR-guided I&D with drain placement of the intrahepatic collection: GS 1-10 PMNs, very few GPC in pairs. Fungal cultures with Candida glabrata  05/05/17: Blood cultures from periphery, Mediport and IJ: no growth  05/04/17: Urine cultures obtained (for unclear reason): no growth  05/08/17: Abscess GS: >25 PMNs, many GPC in chains, few GNB. Aerobic cultures growing 2+ Enterobacter (ertapenem, Cipro, TMP/SMX-sensitive) and 3+VRE, Fungal: C. Glabrata (sens to caspo, vori and dose dependent sens to fluconazole)  06/06/17: 2 sets of BCx (1 set each from periphery and Mediport) - taken after restarting ertapenem: IVAD grew VRE ttp 18.2 hours,  Periphery (Left arm) grew VRE ttp 17.3.  06/07/17 Pleural fluid (left) GS 1-10 PMN's, 1-10 Nucleated white cells, No organisms seen and 1604 nucleated cells. No growth on culture.   06/07/17 Abdominal abscess: labs cancelled - no specimens received.   06/08/17: Mediport BC + for VRE in 23 hrs (R-Amp, R- tetracycline per lab, suppressed result, R- doxycycline;  S-Linezolid, S-Dapto dose dependent),  Right hand peripheral cx: No growth   06/10/17: Blood culture 2/2: 1 periphery (RHand), 1 from Mediport before removed: no growth  06/11/17: Blood Culture 2/2 (both periphery) - no growth   06/19/17:  Perigastric fluid #1:  GS 10-25 PMNs -  GPB, GNB, GPC in pairs. 1+ C. Glabrata (R- fluconazole, voriconazole, S- Caspofungin); 2+ Enterococcus faecium (R- amp, PenG,  Vanco; S- Linezolid)  06/19/17:  Liver fluid collection:  GS: 0 PMNs, Fungal and AFB stains negative, 1+ enterococcus faecium (R- Amp, PenG, Vanco, S- Dapto  dose dependent, Linezolid)  06/19/17 : Perigastric fluid #2: GS: >25 PMNs, Gram negative bacilli; 3+ C. Glabrata.   07/04/17 U/A negative  07/04/17 Blood Cultures 3/3 (1 set PICC proximal port, 2 sets periphery): NGTD  07/04/17 MRSA amplification Nares: negative  07/05/17 Blood Culture 3/3 (2 PICC, 1 Periph) - NGTD  07/05/17 Liver Abscess (IR drain)- <1PMNs, GPC in prs and chains, 1 colony candida albicans (preliminary), 4+ Enterococcus faecium - two isolates with one now resistant to Daptomycin, the other dose dependent.      Susceptibility      Enterococcus faecium (3)     MIC BP MIC     Ampicillin   Resistant     Daptomycin 8.0 mcg/mL Resistant      Linezolid 2.0 mcg/mL Sensitive      Penicillin-G   Resistant     Vancomycin   Resistant             Susceptibility      Enterococcus faecium (4)     MIC BP MIC     Ampicillin   Resistant     Daptomycin 4.0 mcg/mL Susceptible - Dose Dependent      Linezolid 2.0 mcg/mL Sensitive      Penicillin-G   Resistant     Vancomycin   Resistant             Narrative     LIVER ABSCESS      Specimen Collected: 07/05/17 17:29 Last Resulted: 07/10/17 12:07              Imaging/Other Relevant Diagnostics:   07/15/17 Dopplers B/L LE: No DVT in the bilateral lower extremities. Limited visualization of calf veins bilaterally due to soft tissue edema.    Assessment and Plan:  ID problem(s):   - s/p enterobacter cloacae bacteremia  - Multiple and extensive Intra-abdominal abscesses growing VRE, enterobacter and candida glabrata, C.albicans  - VRE Bacteremia     72 y.o. female with h/o recent PE and pancreatic cancer, s/p whipple procedure (goal of cure), complicated by delayed gastric emptying, elevated LFTs and sepsis, malnutrition 2/2 early satiety and anorexia (requiring PEG), and multiple intraabdominal and hepatic abscesses and drains (see  micro/cultures above).  See extensive history in writer's previous note, including most recent sepsis episode on 5/2 requiring ICU observation/fluids/increased O2 demands, no pressors required, stabilized quickly, source was not definitively identified, returned to [facility] floor on 5/7.   Currently she is on Ertapenem, Linezolid (one isolate of VRE now resistant to Dapto), and Caspofungin.     Kinzey overall appearance is improving though she is having a rough day 2/2 nausea, on verge of vomiting, after eating a container of ice cream this morning.  She is c/o abdominal pain, bloated and tympanic on exam.  She had a BM yesterday, PEG is being vented PRN.  She is afebrile with a normal white count.  We will look at removing gram negative coverage if she continues be clinically stable. She has one remaining abdominal drain in with small amount bilious clear output. She is tolerating Linezolid, Ertapenem, and Caspofungin.     Recommendations:   Continue ertapenem 1 gram daily for gram negatives/aerobes   Continue Linezolid IV 600 mg twice daily for VRE, watch for DDI/serotonin syndrome, monitor for myelosuppression   Continue Caspofungin 50 mg IV daily   Please follow CBC with diff and CMP 2-3 times weekly (or more often per primary team discretion).     ID Team 3 will continue to follow  along    Patient seen with and plan discussed with ID attending Dr. Aundria Mems    Thank you for allowing [name] to participate in the care of this patient  Please call with questions/concerns.     Lonzo Candy, ANP-BC  Infectious Diseases, Team 3  Pager [phone]  Office Ph [phone]    ID Attending Addendum:  I reviewed all current labs (including cultures and sensitivities) and all recent radiologic studies including CT AP,  With ANP Lonzo Candy.  I personally took a history and did a physical examination on the patient;  I agree with the above history and physical as documented by the ANP.    Imp:  Multiple abdominal fluid  collections, including RUQ fluid collection positive for VRE and Candida albicans.  Noted one RUQ drain has been removed.    Pancreatic ca    Plan:  As she recently grew out VRE and Candida albicans from RUQ fluid collection, continue the linezolid and caspofungin.  Watch cbc in patient on linezolid;  Also watch for serotonin syndrome.  For now continue ertapenem, though no recent cultures show gram negative rods.  (Concerned about continued fluid collections, some worse on last CT AP).  Perhaps can change to oral regimen if taking po better.    Case discussed in detail with surgical attending on 5/10.

## 2017-07-16 NOTE — Progress Notes (Signed)
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 Abdominal abscesses w/ drains in place, advancing diet, tolerating po meds (methadone and cymbalta) . On last Thursday, patient experienced emesis and likely vagal response, TFs held temporarily. Repeat CT scan 5/9, s/p Whipple w hepatic collections reduced with new drainage catheters. Increased perigastric collections and stable left hemidiaphragm collection. Remains on triple abx therapy and diet advanced to fulls.     Significant 24h Events: Tube feeds held yesterday for an hour. Methadone increased to 7.5, 10, and 10. Decreased Dilaudid PCA to 0.7 q 15 min prn.      Subjective: Catheryne is sitting in chair,with bucket in front of her and an untouched food tray. "I do not know how much longer I can do this. I want to give up but Dr Ron Agee and [name] do not want me to".     Palliative Care ROS: She states her nausea resumed with tube feeds yesterday and she feel bloated and firm. No BM or flatus today. She has abdominal pain mid abdomen rating 5-7/10 with increased pain with activity.       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    Anemia D64.9    Hypothyroidism E03.9    Pancreatic cancer C25.9    Anemia D64.9     hx of Pulmonary embolism I26.99    Acute pulmonary insufficiency J98.4    Depression F32.9    Sepsis A41.9       No Known Allergies (drug, envir, food or latex)    Scheduled Meds:    metoclopramide IV  10 mg Intravenous Q6H    sennosides  8.8 mg Oral Nightly    polyethylene glycol  17 g Oral Daily    methadone  7.5 mg Oral Daily    And    methadone  10 mg Oral BID    lactobacillus rhamnosus (GG)  1 capsule  Oral Daily    ertapenem  1,000 mg Intravenous Q24H    bisacodyl  10 mg Rectal Daily    levothyroxine  137 mcg Oral Daily @ 0600    DULoxetine  20 mg Oral Q24H    linezolid  600 mg Intravenous Q12H    caspofungin  50 mg Intravenous Q24H    lidocaine  1 patch Transdermal Q24H    lidocaine  1 patch Transdermal Q24H       Continuous Infusions:    heparin 1,400 Units/hr (07/16/17 1059)    HYDROmorphone         PRN Meds:  ondansetron, albuterol, acetaminophen, butalbital-acetaminophen-caffeine, methyl salicylate-menthol, LORazepam, HYDROmorphone PF, naloxone, mineral oil-hydrophilic petrolatum, sodium chloride, sodium chloride, sodium chloride, dextrose    Past 24h PRN Usage:  Dilaudid PCA 0.7 mg x 17 in 24 hrs.     Physical Examination:   BP: (100-132)/(60-80)   Temp:  [36.2 C (97.2 F)-37.1 C (98.8 F)]   Temp src: Temporal (05/14 1606)  Heart Rate:  [87-99]   Resp:  [16]   SpO2:  [92 %-99 %]   General appearance: Alert and interactive nauseated with bucket in front of her.   Head: Sclera anicteric, MMM, normocephalic w/o  trauma.  Lungs: respirations easy, regular rate  Heart: S1, S2 RRR  Abdomen: more distended and firmer than yesterday, Mid abdomen with tenderness.  Right perc drain with bilious drg. G tube site capped and benign  Extremities: Anasarca with pitting edema and areas of weeping BLE  Neurologic: Affect sad and tearful venting feelings of futility. Alert and oriented.     Assessment/Plan: Cypress is a 72 yo female w pancreatic CA s/p Whipple in January and a complicated prolonged hospital course. Recent nausea and bloating today. No change in pain despite decrease in PCA.      Abdominal Pain/Dyspnea/fibromyalgia  - continue Dilaudid 0.7 mg q 15 min  (19 attempts and 17 deliveries for 24 hrs.)   - Continue methadone 7.5 mg / 10 mg/10 mg  - recheck QTC today (last 5/2 392) and weekly  - continue ambulation/pulmonary toilet  - lido patch x 2 daily  - Tylenol 650 mg pr q 4 prn  - in past gabapentin  and lyrica have been ineffective in treating her fibromyalgia    Anxiety/Agitation  - continue cymbalta 20 mg po every day (watch for seritonin syndrome w/ linezolid which you would expect to see in next day or two w/ symptoms of tremor, N/V/D, diaphoresis or hemodynamic instability)  - ativan 0.25 mg q 6 hrs prn  - continue to encourage alternate therapies for coping- aromatherapy, meditation, gentle massage    Nausea/fatigue  - f/u TSH on levothyroxine 137 mcg every day  - consider decreasing reglan to 5 mg or changing to prn  - zofran 4 mg IV prn    Constipation, last BM 5/13  - bisacodyl supp prn  - Change senna to tablet and increase 2 tabs po bid      [name] ANP  Palliative Care Service  [facility] [phone]  07/16/2017 4:57 PM

## 2017-07-16 NOTE — Progress Notes (Addendum)
Surgical Oncology Surgery Progress Note    Patient: Khristi Schiller    LOS: 109 days    Attending: [place].  Pain controlled, patient feeling well this AM.  Tolerating TF and minimal PO, +Flatus, +BM     OBJECTIVE  Physical Exam:  Temp:  [36.1 C (97 F)-37.1 C (98.8 F)] 36.2 C (97.2 F)  Heart Rate:  [84-99] 98  Resp:  [16] 16  BP: (110-132)/(60-80) 132/70   GEN: no apparent distress  HEENT: NCAT, face symmetric  CHEST: nonlabored respirations  ABD: soft, distended, tender surrounding G-tube that is CDI, Perc Drain in hepatic abscess with drainage  NEURO/MOTOR: alert, appropriate  EXTREMITIES: atraumatic  VASCULAR: feet wwp, 2+ edema    Intake/Output:  05/13 0700 - 05/14 0659  In: 2184.8 [P.O.:50; I.V.:354.8]  Out: 485 [Urine:400; Drains:85]     Medications:  Current Facility-Administered Medications   Medication    ondansetron (ZOFRAN) injection 4 mg    albuterol (PROVENTIL) nebulization 2.5 mg    sennosides (SENOKOT) 8.8 MG/5ML syrup 8.8 mg    polyethylene glycol (GLYCOLAX,MIRALAX) powder 17 g    acetaminophen (TYLENOL) 650 MG/20.3 ML solution 650 mg    butalbital-acetaminophen-caffeine (FIORICET, ESGIC) 50-325-40 MG per tablet 1 tablet    methadone (DOLOPHINE) tablet 7.5 mg    And    methadone (DOLOPHINE) tablet 10 mg    metoclopramide (REGLAN) injection 5 mg    heparin 100 units/ml infusion    lactobacillus rhamnosus (GG) (CULTURELLE) capsule 1 each    methyl salicylate-menthol (BENGAY) topical cream    ertapenem (INVANZ) IV 1,000 mg    bisacodyl (DULCOLAX) suppository 10 mg    levothyroxine (SYNTHROID, LEVOTHROID) tablet 137 mcg    DULoxetine (CYMBALTA) DR capsule 20 mg    linezolid (ZYVOX) IVPB 600 mg    caspofungin (CANCIDAS) 50 mg in sodium chloride 0.9% 100 mL mini-bag    LORazepam (ATIVAN) 2 mg/mL injection 0.25 mg    HYDROmorphone (DILAUDID) injection 1 mg    naloxone (NARCAN) 0.4 mg/mL injection 0.1 mg    HYDROmorphone (DILAUDID) PCA 1  mg/mL    mineral oil-hydrophilic petrolatum (AQUAPHOR) ointment    sodium chloride 0.9 % flush 10 mL    sodium chloride 0.9 % flush 10 mL    sodium chloride 0.9 % FLUSH REQUIRED IF PATIENT HAS IV    dextrose 5 % FLUSH REQUIRED IF PATIENT HAS IV    lidocaine (LIDODERM) 5 % patch 1 patch    lidocaine (LIDODERM) 5 % patch 1 patch     Facility-Administered Medications Ordered in Other Encounters   Medication    etomidate (AMIDATE) 2 mg/mL injection    rocuronium (ZEMURON) 10 mg/mL injection         Laboratory values:   Recent Labs      07/15/17   0105   WBC  9.5   Hemoglobin  6.4*   Hematocrit  21*   Platelets  405*   INR  1.1     No components found with this basename: APTT, PT Recent Labs      07/15/17   0105   Sodium  136   Potassium  3.8   Chloride  97   CO2  28   UN  11   Creatinine  0.42*   Glucose  88   Calcium  7.8*   Magnesium  1.6   Phosphorus  4.4    Recent Labs      07/15/17  0105   AST  81*   ALT  97*   Alk Phos  514*   Bilirubin,Total  <0.2   Bilirubin,Direct  <0.2     Recent Labs      07/15/17   0105   Total Protein  5.9*   Albumin  2.5*   Prealbumin  14*     No results for input(s): AMY, LIP in the last 72 hours.   GLUCOSE:   No results for input(s): PGLU in the last 72 hours.  Imaging: US Doppler Vein Bilateral Lower Extremities    Result Date: 07/15/2017  No DVT in the bilateral lower extremities. Limited visualization of calf veins bilaterally due to soft tissue edema. END OF IMPRESSION [facility] submits this DICOM format image data and final report to the [facility], an independent secure electronic health information exchange, on a reciprocally searchable basis (with patient authorization) for a minimum of 12 months after exam date.       ASSESSMENT  Kessa Fairbairn is a 72 y.o. female with h/o recent PE and pancreatic cancer POD # 102  status post whipple procedure complicated by delayed gastric emptying.  NGT replaced on 04/04/17. UGI on 04/12/17 concerning for obstruction just beyond the  [place] in the efferent limb.  Course complicated on 06/28/81 by rising LFTs and sepsis with CT concerning for cholangitis given biliary tree obstruction and PV compression due to hematoma and afferent limb distention in setting of PE treatment. Is s/p IR PTC on 04/16/17 and IR percutaneous aspiration of anterior abdominal fluid collection on 04/24/17. IR LUQ perc drain placement 3/1. IR anterior abdominal 17f perc drain placement 3/3. IR anterior perc drain into left hepatic collection 3/6.     PLAN  Diet tube feeding with tray ([facility] Regular   Vent G-tube PRN nausea   Analgesia with PCA, Methadone, appreciate palliative care assistance   Continue Antibiotics per ID, follow up cultures   Will discuss Heparin infusion with Heme/Onc   OOB/ambulate, PT/OT   Dispo: SNF pending clinical improvment     Author: DKathrin Greathouse MD as of: 07/16/2017  at: 9:00 AM       HPB-GI Attending Addendum:   I personally examined the patient, reviewed the notes, and discussed the plan of care with the residents and patient. Agree with detailed resident's note. Please see the note above for details of history, exam, labs, assessment/plan which reflect my input.     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   Stable fluid collection. Leakage resolved.   Attempting dietary advance. Tube feedings at goal. Begin cycling.   Hyperglycemia with insulin sliding scale and recurrent need for insulin.   Post operative anemia. Stable.  Stable. No need for transfusion.  Pulmonary embolism. Present on admission. Will ask Medical Oncology to weigh in on duration of anticoagulation.   Hypoalbuminemia. Sever protein calorie malnutrition.  Tube feeding at goal. May cycle.   Depression. Cimbalta. Daily encouragement and reinforcement of goals.    Constipation . Aggressive regimen.   Physical debility. Requires rehabilitation setting.   Pain control. Changes per palliative care for Methadone and PCA       EDelano Metz MD, FACS  Hepatobiliary, Pancreas & GI Surgery

## 2017-07-17 LAB — PLATELET COUNT: Platelets: 420 10*3/uL — ABNORMAL HIGH (ref 160–370)

## 2017-07-17 LAB — APTT: aPTT: 58.4 s — ABNORMAL HIGH (ref 25.8–37.9)

## 2017-07-17 MED ORDER — METHADONE HCL 10 MG PO TABS *I*
10.0000 mg | ORAL_TABLET | Freq: Two times a day (BID) | ORAL | Status: DC
Start: 2017-07-17 — End: 2017-07-19
  Administered 2017-07-17 – 2017-07-18 (×4): 10 mg via ORAL
  Filled 2017-07-17 (×4): qty 1

## 2017-07-17 MED ORDER — FUROSEMIDE 10 MG/ML IJ SOLN *I*
20.0000 mg | Freq: Once | INTRAMUSCULAR | Status: AC
Start: 2017-07-17 — End: 2017-07-17
  Administered 2017-07-17: 20 mg via INTRAVENOUS
  Filled 2017-07-17: qty 2

## 2017-07-17 MED ORDER — PROSOURCE NO CARB PO LIQD *I*
30.0000 mL | Freq: Every day | ORAL | Status: DC
Start: 2017-07-18 — End: 2017-08-13
  Administered 2017-07-18 – 2017-08-12 (×21): 30 mL via ORAL

## 2017-07-17 MED ORDER — HYDROMORPHONE HCL 4 MG PO TABS *I*
4.0000 mg | ORAL_TABLET | ORAL | Status: DC | PRN
Start: 2017-07-17 — End: 2017-07-22
  Administered 2017-07-17 – 2017-07-22 (×5): 4 mg via ORAL
  Filled 2017-07-17 (×5): qty 1

## 2017-07-17 MED ORDER — METHADONE HCL 5 MG PO TABS *I*
7.5000 mg | ORAL_TABLET | Freq: Every day | ORAL | Status: DC
Start: 2017-07-18 — End: 2017-07-19
  Administered 2017-07-18 – 2017-07-19 (×2): 7.5 mg via ORAL
  Filled 2017-07-17 (×2): qty 2

## 2017-07-17 MED ORDER — SENNOSIDES 8.8 MG/5ML PO SYRP *I*
17.6000 mg | ORAL_SOLUTION | Freq: Two times a day (BID) | ORAL | Status: DC
Start: 2017-07-17 — End: 2017-08-05
  Administered 2017-07-17 – 2017-08-05 (×27): 17.6 mg via ORAL
  Filled 2017-07-17 (×41): qty 10

## 2017-07-17 NOTE — Progress Notes (Signed)
Mertha Clyatt is currently functioning below her baseline and will likely require SNF rehab before returning to she prior living arrangement. she will likely return to her prior level of functioning with a short term rehab stay. However, if discharge directly to home is desired, Janda Cargo will need 24 hour assistance with all mobility and require Home PT     PT Discharge Equipment Recommended: To be determined if returning to their prior living environment today.    Physical Therapy Treatment Note:     07/17/17 1515   PT [place]   Visit Number   Visit Number [facility]) / Treatment Day [facility]) 3   Visit Details [facility])   Visit Type [facility]) Follow Up   Reason for visit [facility]) General   Precautions/Observations   Precautions used Yes   LDA Observation IV lines;Feeding tube   Fall Precautions General falls precautions   Other Pt up in recliner chair, reports not feeling well today. Daughter present, supportive t/o, agreeable with encouragement.   Pain Assessment   *Is the patient currently in pain? Yes   Pain (Before,During, After) Therapy Before;During   0-10 Scale 8   Pain Location Generalized   Pain Intervention(s) Repositioned;Refer to nursing for pain management   Cognition   Arousal/Alertness Appropriate responses to stimuli   Following Commands Follows simple commands consistently   Additional Comments Pt required increased encouragement to complete mobility. Daughter supportive and encouraging   Bed Mobility   Bed mobility Tested   Supine to Sit (up in recliner)   Sit to Supine Minimum assist ;2 person assist;Side rails up (#);Head of bed elevated   Additional comments PT required 1A for control of trunk and 2nd assist for BLE into bed to assist with controlling pain into bed and d/t weakness.   Transfers   Transfers Tested   Sit to Stand Moderate ;1 person assist   Stand to sit Minimum ;1 person assist   Transfer Assistive Device rolling walker   Additional comments Pt able to scoot towards  edge of bed to prepare for transfer without hands on assist. Required increased cues for forward trunk flexion to assist wiht transfer. Increased assist for forward weight shift and to bring to upright position. Completed x2.   Mobility   Mobility Tested   Gait Pattern Decreased cadence;Decreased R step length;Decreased R step height;Decreased L step length;Decreased L step height   Ambulation Assist Contact guard;1 person assist   Ambulation Distance (Feet) ~124ft   Ambulation Assistive Device rolling walker   Additional comments Pt ambulated at slow gait speed with overall steady patten and no overt loss of balance. Pt required increased VCs for longer step length and improved proximity to RW. Pt endorsed increased pain during gait. CGA provided at trunk for steayding assist during weight shifts.   Therapeutic Exercises   Additional comments PT completed x10 static marches, heel raises, and mini-squats with RW and CGA   Balance   Balance Tested   Sitting - Static Independent ;Unsupported   Standing - Static Contact guard;Supported   Standing - Dynamic Min assist;Supported   PT AM-PAC Mobility   Turning over in bed? 1   Sitting down on and standing up from a chair with arms? 1   Moving from lying on back to sitting on the side of the bed? 1   Moving to and from a bed to a chair? 3   Need to walk in hospital room? 3   Climbing 3 -  5 steps with a railing? 1   Total Raw Score 10   Standardized Score 32.29   CMS 1-100% Score 77   Assessment   Brief Assessment Remains appropriate for skilled therapy   Problem List Impaired functional mobility;Pain contributing to impairment;Impaired UE strength;Impaired LE strength;Impaired balance   Patient / Family Goal get better, improved pain   Plan/Recommendation   Treatment Interventions [facility]   PT Frequency 2-4x/wk   Mobility Recommendations 1A with RW, another assist for IV pole and lines, please assist wiht ambulation 2-3x/day   Discharge Recommendations [place]   PT Discharge Equipment Recommended To be determined   Assessment/Recommendations Reviewed With: Patient;Nursing;Family;Care coordinator   Next PT Visit progress all mobility; higher level balance with ambulation, dual task   Time Calculation   PT Timed Codes 35   PT Untimed Codes 0   PT Unbilled Time 0   PT Total Treatment 35   Plan and Onset date   Plan of Care Date 07/08/17   Onset Date 03/29/17   Treatment Start Date 07/08/17     Rodena Piety, PT, DPT  Pager [phone]

## 2017-07-17 NOTE — Progress Notes (Signed)
Palliative Care Progress Note    Subjective: " The legs hurt and they are weak and swollen"    Patient continues to have pain that is not well controlled.  She has joint pain and superficial skin pain around tubes and on legs that do not seem to respond well to opioids.   She is tolerating TF better today after Reglan started by team.    She was on oral dilaudid at home and OK with patient and family to continue to taper dilaudid PCA and add oral today.    Palliative Care ROS:  Pain   Severe  Nausea   None  Anorexia   Moderate  Anxiety   Mild  Depression   Moderate  Shortness of Breath   None  Tiredness/Fatigue   Moderate  Drowsiness/Sleepiness   Mild  Airway Secretions   no  Constipation   no  Delirium   no    Physical Examination:   BP: (100-128)/(60-70)   Temp:  [36.2 C (97.2 F)-36.6 C (97.9 F)]   Temp src: Temporal (05/15 0832)  Heart Rate:  [76-94]   Resp:  [16-18]   SpO2:  [91 %-99 %]   BP 120/70 (BP Location: Right arm)    Pulse 87    Temp 36.2 C (97.2 F) (Temporal)    Resp 16    Ht 1.702 m (5\' 7" )    Wt 85.1 kg (187 lb 11.2 oz)    SpO2 97%    BMI 29.39 kg/m   General appearance: alert, fatigued and mild distress  Throat: lips, mucosa, and tongue normal; teeth and gums normal  Lungs: clear to auscultation bilaterally  Heart: regular rate and rhythm  Abdomen: round distended, +BS, tol TFs via PEG, R side abd perc drain (scant pale green drainage)   Extremities: edema 1+ with venous statis noted BLE, knees painful with palpation  Neurologic: Grossly normal    Assessment/Plan:  Amanda Galloway is a [name] w/ pancreatic CA s/p Whipple in Jan w/ prolonged complicated hosp course, 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 Abd abscesses w/ drains in place, advancing diet, tolerating po meds (methadone and cymbalta) will monitor for s/s serotonin syndrome (no symptoms noted 07/17/17)).     Pt case discussed w/ SW, understand discussions re  SNF placement have resumed.     Anticipate pt will slow taper, to transition completely off PCA d/t high tolerance and methadone restarted on 5/7. May be able to increase methadone soon if pain uncontroled.  PCA now at 0.7 mg PRN, will go down to 0.6 mg today every 15 min PRN.    Will plan to go down by 0.1 mg dilaudid on PCA dose daily and have oral available for unmet pain needs.    Recommend add dilaudid 4 mg tab every 2 hr PRN for oral use if pain not controlled with PCA.    Pain/dyspnea  LE / abdomen pain: now on Methadone 10/10/7.5 daily (27.5 mg total) that was increased x 2 days.  Dilaudid PCA turned down to 0.7 mg every 20 min prn (x 10 in 24hr)    Lidocaine patch daily x 2 (abd, LUE)  Acetaminophen 650 mg pr Q 4 hrs prn fever (x 1 on 5/5)    Anxiety/agitation/nausea/fatigue  Cymbalta 20 mg po/drestarted last week, no effect yet notedbut need to use cautiously due to potential interactions    Aware pt w/ fibromyalgia, may  consider lyrica in future after discuss with pharmacy if interaction with Linezolid..    Levothyroxine 69 mcg IV daily (started IV 4/23, last TSH checked 12/14/16 was 2.87)  Ativan 0.25 mg IV Q 6 hrs prn for anxiety (use is 1-2 x daily)     Nausea: Reglan 10 mg Q6hr ATC added to help prevent and also help with BM / gastric emptying and encouraging BM.  Ondansetron 4 mg IV Q 6 hrs prn    Prevention of opioid induced constipation, last stool 5/12  Bisacodyl supp pr daily     Dry flaky edematous Skin  Aquaphor prn   Eucerin prn for BLEs    She has been using Ephraim Hamburger with help for joint pain PRN    GOC/prognosis  Full code  Dtr Rod Mae is HCP (very supportive and informed, former [facility] RN)  Goal currently is to prioritize pt's symptom control, long course though pt endorses agreement w/ continuing current treatment plan, dispo to SNFin hopes of eventually making recovery back to independent living    Pt case discussed w/ bedside RN, covering PA and SW. No med changes for  today, reiterated that pt likely will need to use PCA while waiting for methadone to take effect.    Total Time Spent 35 minutes:   >50% of time was spent in counseling and/or coordination of care.       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    Anemia D64.9    Hypothyroidism E03.9    Pancreatic cancer C25.9    Anemia D64.9     hx of Pulmonary embolism I26.99    Acute pulmonary insufficiency J98.4    Depression F32.9    Sepsis A41.9       No Known Allergies (drug, envir, food or latex)    Scheduled Meds:    sennosides  17.6 mg Oral 2 times per day    methadone  10 mg Oral BID    And    [START ON 07/18/2017] methadone  7.5 mg Oral Daily    metoclopramide IV  10 mg Intravenous Q6H    polyethylene glycol  17 g Oral Daily    lactobacillus rhamnosus (GG)  1 capsule Oral Daily    ertapenem  1,000 mg Intravenous Q24H    bisacodyl  10 mg Rectal Daily    levothyroxine  137 mcg Oral Daily @ 0600    DULoxetine  20 mg Oral Q24H    linezolid  600 mg Intravenous Q12H    caspofungin  50 mg Intravenous Q24H    lidocaine  1 patch Transdermal Q24H    lidocaine  1 patch Transdermal Q24H       Continuous Infusions:    heparin 1,400 Units/hr (07/17/17 1022)    HYDROmorphone         PRN Meds:  ondansetron, albuterol, acetaminophen, butalbital-acetaminophen-caffeine, methyl salicylate-menthol, LORazepam, HYDROmorphone PF, naloxone, mineral oil-hydrophilic petrolatum, sodium chloride, sodium chloride, sodium chloride, dextrose

## 2017-07-17 NOTE — Progress Notes (Signed)
Surgical Oncology Surgery Progress Note    Patient: Amanda Galloway    LOS: 110 days    Attending: [place].  Pain controlled, patient feeling well this AM.  Tolerating TF and minimal PO, Denies flatus/BM     OBJECTIVE  Physical Exam:  Temp:  [36.2 C (97.2 F)-36.6 C (97.9 F)] 36.6 C (97.9 F)  Heart Rate:  [76-92] 92  Resp:  [16-18] 16  BP: (100-128)/(60-70) 128/68   GEN: no apparent distress  HEENT: NCAT, face symmetric  CHEST: nonlabored respirations  ABD: soft, distended, tender surrounding G-tube that is CDI, Perc Drain in hepatic abscess with drainage  NEURO/MOTOR: alert, appropriate  EXTREMITIES: atraumatic  VASCULAR: feet wwp, 2+ edema    Intake/Output:  05/14 0700 - 05/15 0659  In: 2270.8 [P.O.:300; I.V.:372.8]  Out: 2865 [Urine:2750; Drains:115]     Medications:  Current Facility-Administered Medications   Medication    sennosides (SENOKOT) 8.8 MG/5ML syrup 17.6 mg    methadone (DOLOPHINE) tablet 10 mg    And    [START ON 07/18/2017] methadone (DOLOPHINE) tablet 7.5 mg    metoclopramide (REGLAN) injection 10 mg    ondansetron (ZOFRAN) injection 4 mg    albuterol (PROVENTIL) nebulization 2.5 mg    polyethylene glycol (GLYCOLAX,MIRALAX) powder 17 g    acetaminophen (TYLENOL) 650 MG/20.3 ML solution 650 mg    butalbital-acetaminophen-caffeine (FIORICET, ESGIC) 50-325-40 MG per tablet 1 tablet    heparin 100 units/ml infusion    lactobacillus rhamnosus (GG) (CULTURELLE) capsule 1 each    methyl salicylate-menthol (BENGAY) topical cream    ertapenem (INVANZ) IV 1,000 mg    bisacodyl (DULCOLAX) suppository 10 mg    levothyroxine (SYNTHROID, LEVOTHROID) tablet 137 mcg    DULoxetine (CYMBALTA) DR capsule 20 mg    linezolid (ZYVOX) IVPB 600 mg    caspofungin (CANCIDAS) 50 mg in sodium chloride 0.9% 100 mL mini-bag    LORazepam (ATIVAN) 2 mg/mL injection 0.25 mg    HYDROmorphone (DILAUDID) injection 1 mg    naloxone (NARCAN) 0.4 mg/mL injection 0.1 mg     HYDROmorphone (DILAUDID) PCA 1 mg/mL    mineral oil-hydrophilic petrolatum (AQUAPHOR) ointment    sodium chloride 0.9 % flush 10 mL    sodium chloride 0.9 % flush 10 mL    sodium chloride 0.9 % FLUSH REQUIRED IF PATIENT HAS IV    dextrose 5 % FLUSH REQUIRED IF PATIENT HAS IV    lidocaine (LIDODERM) 5 % patch 1 patch    lidocaine (LIDODERM) 5 % patch 1 patch     Facility-Administered Medications Ordered in Other Encounters   Medication    etomidate (AMIDATE) 2 mg/mL injection    rocuronium (ZEMURON) 10 mg/mL injection         Laboratory values:   Recent Labs      07/17/17   0026  07/15/17   0105   WBC   --   9.5   Hemoglobin   --   6.4*   Hematocrit   --   21*   Platelets  420*  405*   INR   --   1.1     No components found with this basename: APTT, PT Recent Labs      07/15/17   0105   Sodium  136   Potassium  3.8   Chloride  97   CO2  28   UN  11   Creatinine  0.42*   Glucose  88  Calcium  7.8*   Magnesium  1.6   Phosphorus  4.4    Recent Labs      07/15/17   0105   AST  81*   ALT  97*   Alk Phos  514*   Bilirubin,Total  <0.2   Bilirubin,Direct  <0.2     Recent Labs      07/15/17   0105   Total Protein  5.9*   Albumin  2.5*   Prealbumin  14*     No results for input(s): AMY, LIP in the last 72 hours.   GLUCOSE:   No results for input(s): PGLU in the last 72 hours.  Imaging: US Doppler Vein Bilateral Lower Extremities    Result Date: 07/15/2017  No DVT in the bilateral lower extremities. Limited visualization of calf veins bilaterally due to soft tissue edema. END OF IMPRESSION [facility] submits this DICOM format image data and final report to the [facility], an independent secure electronic health information exchange, on a reciprocally searchable basis (with patient authorization) for a minimum of 12 months after exam date.       ASSESSMENT  Amanda Galloway is a 72 y.o. female with h/o recent PE and pancreatic cancer POD # 102  status post whipple procedure complicated by delayed gastric emptying.   NGT replaced on 04/04/17. UGI on 04/12/17 concerning for obstruction just beyond the [place] in the efferent limb.  Course complicated on 7/85/88 by rising LFTs and sepsis with CT concerning for cholangitis given biliary tree obstruction and PV compression due to hematoma and afferent limb distention in setting of PE treatment. Is s/p IR PTC on 04/16/17 and IR percutaneous aspiration of anterior abdominal fluid collection on 04/24/17. IR LUQ perc drain placement 3/1. IR anterior abdominal 52f perc drain placement 3/3. IR anterior perc drain into left hepatic collection 3/6.     PLAN  Diet tube feeding with tray ([facility] Regular, to Cycle TF  Vent G-tube PRN nausea  Bowel regimen   Continue diuresis   Analgesia with PCA, Methadone, appreciate palliative care assistance   Continue Antibiotics per ID, follow up cultures   Will discuss Heparin infusion with Heme/Onc   OOB/ambulate, PT/OT   Dispo: SNF pending clinical improvment     Author: DKathrin Greathouse MD as of: 07/17/2017  at: 8:28 AM

## 2017-07-17 NOTE — Consults (Signed)
Medical Nutrition Therapy - Follow Up    Admit Date: 03/29/2017    Patient Summary: 71 y.o. female with h/o recent PE and pancreatic cancer status post whipple procedure complicated by delayed gastric emptying. NGT replaced on 04/04/17. UGI on 04/12/17 concerning for obstruction just beyond the [place] in the efferent limb. Course complicated on 5/62/13 by rising LFTs and sepsis with CT concerning for cholangitis given biliary tree obstruction and PV compression due to hematoma and afferent limb distention in setting of PE treatment. Is s/p IR PTC on 04/16/17 and IR percutaneous aspiration of anterior abdominal fluid collection on 04/24/17. IR LUQ perc drain placement 3/1. IR anterior abdominal 34f perc drain placement 3/3. IR anterior perc drain into left hepatic collection 3/6    Pertinent Meds: dilaudid, caspofungin, linezolid, levothyroxine, bowel regimen including daily suppository, duloxetine, ertapenem, culturelle, reglan, methadone   Pertinent Labs: reviewed     Reviewed I/O's: -115 ml drain output     Enteral or parenteral access: PEG, PICC     Food allergies: NKFA    Current diet: regular   TF order: Vital AF 1.2 @ 65 ml/hr continuous with 30 ml FWF Q4H  Supplements: none     Nutrition Focused Physical Exam:    Edema: +1 flank and groin edema, +4 BLE   Abdomen: surgical scar, tenderness/guarding, loss of appetite, +BS, +flatus   Skin: erythema/redness, abrasion to elbow, skin tear to abdomen     Anthropometrics:  Height: 170.2 cm ('5\' 7"' )    Current Weight: 85.1 kg (187 lb 11.2 oz) (5/12); 118% IBW  Ideal Body Weight: 72.4 kg + 10%  BMI: 29.4 kg/(m^2) overweight with edema noted   Weight Hx: weight gain due to fluid shift     07/14/2017 85.14 kg 187 lbs 11 oz Actual   07/06/2017 78.608 kg 173 lbs 5 oz --   06/24/2017 79.697 kg 175 lbs 11 oz --   06/18/2017 76.885 kg 169 lbs 8 oz Actual       Estimated Nutrient Needs: (Based on low weight of admission, 64.9 kg; close to being a dry weight)    1625-1950 kcal/day (25-30  kcal/kg)   85-117 g protein/day (1.3-1.8 g/kg)    1625-1950 mL fluid/day (25-30 mL/kg) or per team recs       Nutrition Assessment and Diagnosis:   Stable fluid collection and resolved leak. LFTs remain elevated, however are trending down (ALT 97/AST 81/alk phos 514). Pt reports she is still feeling nauseated and wants to give up on the tube feeds, however she knows she shouldn't. Per RN, TFs were held overnight due to nausea, will be re-started. Recommend to change back to Osmolite 1.5 as she appeared to tolerate these. Osmolite is a more fluid-concentrated formula which may help with edema. Pt reports she is trying to drink as much as possible, however fluid intake appears minimal. TFs as ordered provides 1872 kcal, 117 g protein and 1444 ml free water daily.     Malnutrition Status: Pt diagnosed with moderate malnutrition 04/09/17.      Nutrition Intervention:   1. Recommend to change to Osmolite 1.5 @ 50 ml/hr x 24 hours with FWF 30 ml Q4H. Will provide 1800 kcal, 75 g protein and 1090 ml free water daily. Recommend to add 30 ml ProSource daily for an additional 60 kcal and 15 g protein.   2. Continue regular diet as tolerated  3. Continue to monitor I/Os, BM pattern and weekly weight   4. Continue other enteral monitoring parameters  Nutrition Monitoring/Evaluation:   1. Will monitor diet tolerance and intake, TF tolerance. nutrition-related labs, weight trend, BM pattern.   2. Will follow up per high nutrition risk protocol.    Laurence Aly, [place]  Pager [phone]

## 2017-07-17 NOTE — Plan of Care (Signed)
Problem: Pain/Comfort  Goal: Patient's pain or discomfort is manageable  Outcome: Progressing towards goal    Goal: Patient's pain or discomfort is manageable  Outcome: Progressing towards goal      Problem: Mobility  Goal: Functional status is maintained or improved - Geriatric  Outcome: Progressing towards goal      Problem: Nutrition  Goal: Patient's nutritional status is maintained or improved  Outcome: Progressing towards goal  Pt getting Tube Feeds and PO meals encouraged    Problem: Cognitive function  Goal: Cognitive function will be maintained  Outcome: Maintaining      Problem: Bowel Elimination  Goal: Elimination patterns are normal or improving  Outcome: Progressing towards goal      Problem: Post-Operative Hemodynamic Stability  Goal: Maintain Hemodynamic Stability  Outcome: Progressing towards goal      Problem: Post-Operative Complications  Goal: Prevent post-operative complications  Outcome: Progressing towards goal      Problem: Psychosocial  Goal: Demonstrates ability to cope with illness  Outcome: Progressing towards goal      Problem: Fluid and Electrolyte Imbalance  Goal: Fluid and Electrolyte imbalance  Outcome: Progressing towards goal      Problem: GI Bleeding Elimination  Goal: Elimination of patterns are normal or improving  Outcome: Progressing towards goal

## 2017-07-17 NOTE — Progress Notes (Signed)
07/17/17 0900   [facility] Patient Class Review   Patient Class Review Inpatient   Patient Class Effective 03/29/2017  Earnest Conroy RN   Utilization  Management  [phone]  Page [phone]

## 2017-07-18 ENCOUNTER — Inpatient Hospital Stay: Payer: Medicare (Managed Care)

## 2017-07-18 LAB — HEPATIC FUNCTION PANEL
ALT: 122 U/L — ABNORMAL HIGH (ref 0–35)
AST: 106 U/L — ABNORMAL HIGH (ref 0–35)
Albumin: 2.6 g/dL — ABNORMAL LOW (ref 3.5–5.2)
Alk Phos: 772 U/L — ABNORMAL HIGH (ref 35–105)
Bilirubin,Direct: <0.2 mg/dL (ref 0.0–0.3)
Bilirubin,Total: <0.2 mg/dL (ref 0.0–1.2)
Total Protein: 6.5 g/dL (ref 6.3–7.7)

## 2017-07-18 LAB — BASIC METABOLIC PANEL
Anion Gap: 10 (ref 7–16)
Anion Gap: 11 (ref 7–16)
CO2: 32 mmol/L — ABNORMAL HIGH (ref 20–28)
CO2: 30 mmol/L — ABNORMAL HIGH (ref 20–28)
Calcium: 8.6 mg/dL (ref 8.6–10.2)
Calcium: 9.1 mg/dL (ref 8.6–10.2)
Chloride: 87 mmol/L — ABNORMAL LOW (ref 96–108)
Chloride: 86 mmol/L — ABNORMAL LOW (ref 96–108)
Creatinine: 0.56 mg/dL (ref 0.51–0.95)
Creatinine: 0.62 mg/dL (ref 0.51–0.95)
GFR,Black: 108 *
GFR,Black: 105 *
GFR,Caucasian: 94 *
GFR,Caucasian: 91 *
Glucose: 122 mg/dL — ABNORMAL HIGH (ref 60–99)
Glucose: 128 mg/dL — ABNORMAL HIGH (ref 60–99)
Lab: 8 mg/dL (ref 6–20)
Lab: 7 mg/dL (ref 6–20)
Potassium: 4 mmol/L (ref 3.3–5.1)
Potassium: 4.2 mmol/L (ref 3.3–5.1)
Sodium: 129 mmol/L — ABNORMAL LOW (ref 133–145)
Sodium: 127 mmol/L — ABNORMAL LOW (ref 133–145)

## 2017-07-18 LAB — MAGNESIUM: Magnesium: 1.7 mg/dL (ref 1.6–2.5)

## 2017-07-18 LAB — APTT: aPTT: 57.3 s — ABNORMAL HIGH (ref 25.8–37.9)

## 2017-07-18 LAB — PROTIME-INR
INR: 1.1 (ref 0.9–1.1)
Protime: 12.4 s (ref 10.0–12.9)

## 2017-07-18 LAB — PHOSPHORUS: Phosphorus: 4.9 mg/dL — ABNORMAL HIGH (ref 2.7–4.5)

## 2017-07-18 MED ORDER — ONDANSETRON HCL 2 MG/ML IV SOLN *I*
4.0000 mg | Freq: Four times a day (QID) | INTRAMUSCULAR | Status: DC
Start: 2017-07-18 — End: 2017-07-25
  Administered 2017-07-18 – 2017-07-25 (×26): 4 mg via INTRAVENOUS
  Filled 2017-07-18 (×26): qty 2

## 2017-07-18 MED ORDER — MAGNESIUM SULFATE 2 GM IN 50 ML *WRAPPED*
2000.0000 mg | Freq: Once | INTRAVENOUS | Status: AC
Start: 2017-07-18 — End: 2017-07-18
  Administered 2017-07-18: 2000 mg via INTRAVENOUS
  Filled 2017-07-18: qty 50

## 2017-07-18 MED ORDER — STERILE WATER FOR IRRIGATION IR SOLN *I*
900.0000 mL | Freq: Once | Status: AC
Start: 2017-07-18 — End: 2017-07-18
  Administered 2017-07-18: 900 mL via ORAL

## 2017-07-18 MED ORDER — IOHEXOL 350 MG/ML (OMNIPAQUE) IV SOLN *I*
126.0000 mL | Freq: Once | INTRAVENOUS | Status: AC
Start: 2017-07-18 — End: 2017-07-18
  Administered 2017-07-18: 126 mL via INTRAVENOUS

## 2017-07-18 NOTE — Progress Notes (Signed)
Infectious Diseases (Team 3) Follow Up Note    Personally reviewed chart, labs, medications. Patient seen.     CC:  F/U VRE bacteremia, Liver and abdominal abscesses, s/p Enterobacter cloacae bacteremia, Intra-abdominal abscesses positive for VRE, enterobacter (last grew out 3/6), candida glabrata and candida albicans (both resistant to fluconazole)     Subjective: Denies F/C, no stools documented on flow sheet in several days (patient states she is moving bowels, no diarrhea, question reliability as she is often confused to time),  endorses intermittent nausea, no emesis, per report still limited oral intact, TFs continue (at goal).     ROS: Reviewed 6 systems in detail, see above    Current Meds:  Scheduled Meds:   iohexol (OMNIPAQUE) 300 mg/mL oral solution 4m/870mL Water  900 mL Oral Once    sennosides  17.6 mg Oral 2 times per day    methadone  10 mg Oral BID    And    methadone  7.5 mg Oral Daily    PROSOURCE NO CARB  30 mL Oral Daily with breakfast    metoclopramide IV  10 mg Intravenous Q6H    polyethylene glycol  17 g Oral Daily    lactobacillus rhamnosus (GG)  1 capsule Oral Daily    ertapenem  1,000 mg Intravenous Q24H    bisacodyl  10 mg Rectal Daily    levothyroxine  137 mcg Oral Daily @ 0600    DULoxetine  20 mg Oral Q24H    linezolid  600 mg Intravenous Q12H    caspofungin  50 mg Intravenous Q24H    lidocaine  1 patch Transdermal Q24H    lidocaine  1 patch Transdermal Q24H     Continuous Infusions:   heparin 1,400 Units/hr (07/18/17 0959)    HYDROmorphone       PRN Meds:.   calcium carbonate  1,000 mg Oral BID PRN    HYDROmorphone PF  0.5 mg Intravenous Q1H PRN    Or    HYDROmorphone PF  1 mg Intravenous Q1H PRN    LORazepam  0.5 mg Oral Q6H PRN    heparin lock flush  50 Units Intracatheter PRN    ondansetron  4 mg Intravenous Q6H PRN     Objective:  BP: (90-120)/(50-70)   Temp:  [36.2 C (97.2 F)-36.9 C (98.4 F)]   Temp src: Temporal (05/16 1206)  Heart Rate:  [83-94]    Resp:  [16-20]   SpO2:  [92 %-97 %]     General Appearance: sitting up in chair, sleeping, NAD    HEENT: Sclera anicteric, MMM, OP clear  Pulm: diminished bilaterally,WOB unlabored, RA at time of visit.   CV: RRR, S1S2, no M/R/G, continued LE edema b/l  Abdomen:  Semi-soft upper quads, soft lower quads, distended (less than two days prior), TTP throughout, PEG intact, one remaining right abdominal perc drain with small amount clear bilious drainage in collection bag.   Extremities:WWP  Skin:Warm and dry; reddened flaking skin and what appears to be resolving blisters b/l LEs  Neuro: alert, appropriate, oriented to place and person, confused to time  Lines/Drains/Tubes: PICC LUE (4/11), PEG (4/12), solitary hepatic perc drain remains      Recent Labs  Lab 07/17/17  0026 07/15/17  0105 07/13/17  0018 07/12/17  0029   WBC  --  9.5 11.1* 10.9*   Hemoglobin  --  6.4* 6.9* 6.7*   Hematocrit  --  21* 23* 22*   Platelets 420* 405* 471* 426*  Seg Neut %  --  72.4 72.6 68.4   Lymphocyte %  --  12.3 12.6 14.0   Monocyte %  --  7.9 8.0 8.8   Eosinophil %  --  6.3 4.7 5.8       Recent Labs  Lab 07/18/17  1204 07/18/17  0104 07/15/17  0105   Sodium 127* 129* 136   Potassium 4.2 4.0 3.8   CO2 30* 32* 28   UN _0 Creatinine 0.62 0.56 0.42*   Glucose 128* 122* 88   Calcium 9.1 8.6 7.8*         Lab results: 07/18/17  0104 07/15/17  0105 07/12/17  0029 07/11/17  0219 07/10/17  0008   Total Protein 6.5 5.9* 6.3 6.6 6.7   Albumin 2.6* 2.5* 2.6* 2.6* 2.7*   ALT 122* 97* 107* 135* 103*   AST 106* 81* 87* 136* 84*   Alk Phos 772* 514* 549* 633* 597*   Bilirubin,Total <0.2 <0.2 <0.2 <0.2 <0.2       Lab Results  Component Value Date/Time   CRP 46 (H) 07/12/2017 0029   CRP 154 (H) 07/05/2017 0913   CRP 125 (H) 06/28/2017 0020       Lab Results  Component Value Date/Time   Sedimentation Rate >=130 (!) 07/12/2017 0029   Sedimentation Rate 73 (H) 07/05/2017 0913   Sedimentation Rate 48 (H) 07/05/2017 0047       Micro:   1/25:  Intraoperative bile: GS 0 pmns, no organisms, Cx: no growth  2/12: 1 set of blood cultures from the right Mediport (no peripheral bld was sent): positive for Enterobacter cloacae complex in 70 hrs, sensitive to zosyn.   2/18 abdominal GS had >25 PMNs, many GPC in pairs/chains, many GNB and cultures 4+ Enterobacter cloacae complex. The cultures also grew 1+ Candida glabrata   2/21: BCx from the Right peripheral IV, Mediport, L IJ:  No growth  05/03/17: IR-guided I&D with drain placement of the left upper quadrant collection: GS > 25 PMNs, many GNB and GN diplococci, Cultures with 4+ Enterobacter cloacae complex (resistant to Zosyn and CTX), C. Albicans and C. Glabrata, and Candida tropicalis  05/04/17:    1 set of blood cultures from left IJ: VRE at 15.3 TTP. Daptomycin sensitivities: MIC 37mg/ml (dose dependent),  Linezolid   1 set from periphery: VRE and Enterobacter cloacae at 14.8 TTP   05/05/17:  IR-guided I&D with drain placement of the intrahepatic collection: GS 1-10 PMNs, very few GPC in pairs. Fungal cultures with Candida glabrata  05/05/17: Blood cultures from periphery, Mediport and IJ: no growth  05/04/17: Urine cultures obtained (for unclear reason): no growth  05/08/17: Abscess GS: >25 PMNs, many GPC in chains, few GNB. Aerobic cultures growing 2+ Enterobacter (ertapenem, Cipro, TMP/SMX-sensitive) and 3+VRE, Fungal: C. Glabrata (sens to caspo, vori and dose dependent sens to fluconazole)  06/06/17: 2 sets of BCx (1 set each from periphery and Mediport) - taken after restarting ertapenem: IVAD grew VRE ttp 18.2 hours,  Periphery (Left arm) grew VRE ttp 17.3.  06/07/17 Pleural fluid (left) GS 1-10 PMN's, 1-10 Nucleated white cells, No organisms seen and 1604 nucleated cells. No growth on culture.   06/07/17 Abdominal abscess: labs cancelled - no specimens received.   06/08/17: Mediport BC + for VRE in 23 hrs (R-Amp, R- tetracycline per lab, suppressed result, R- doxycycline;  S-Linezolid, S-Dapto dose dependent),  Right  hand peripheral cx: No growth   06/10/17: Blood culture 2/2:  1 periphery (RHand), 1 from Mediport before removed: no growth  06/11/17: Blood Culture 2/2 (both periphery) - no growth   06/19/17:  Perigastric fluid #1:  GS 10-25 PMNs -  GPB, GNB, GPC in pairs. 1+ C. Glabrata (R- fluconazole, voriconazole, S- Caspofungin); 2+ Enterococcus faecium (R- amp, PenG, Vanco; S- Linezolid)  06/19/17:  Liver fluid collection:  GS: 0 PMNs, Fungal and AFB stains negative, 1+ enterococcus faecium (R- Amp, PenG, Vanco, S- Dapto dose dependent, Linezolid)  06/19/17 : Perigastric fluid #2: GS: >25 PMNs, Gram negative bacilli; 3+ C. Glabrata.   07/04/17 U/A negative  07/04/17 Blood Cultures 3/3 (1 set PICC proximal port, 2 sets periphery): NGTD  07/04/17 MRSA amplification Nares: negative  07/05/17 Blood Culture 3/3 (2 PICC, 1 Periph) - NGTD  07/05/17 Liver Abscess (IR drain)- <1PMNs, GPC in prs and chains, 1 colony candida albicans (preliminary), 4+ Enterococcus faecium - two isolates with one now resistant to Daptomycin, the other dose dependent.      Susceptibility      Enterococcus faecium (3)     MIC BP MIC     Ampicillin   Resistant     Daptomycin 8.0 mcg/mL Resistant      Linezolid 2.0 mcg/mL Sensitive      Penicillin-G   Resistant     Vancomycin   Resistant             Susceptibility      Enterococcus faecium (4)     MIC BP MIC     Ampicillin   Resistant     Daptomycin 4.0 mcg/mL Susceptible - Dose Dependent      Linezolid 2.0 mcg/mL Sensitive      Penicillin-G   Resistant     Vancomycin   Resistant             Narrative     LIVER ABSCESS      Specimen Collected: 07/05/17 17:29 Last Resulted: 07/10/17 12:07              Imaging/Other Relevant Diagnostics:     07/18/17 CT abd/pelvis: Preliminary report:   FINDINGS:  Chest Base: Moderate bilateral pleural effusion with bilateral lower lobe atelectasis   Liver/Biliary Tract: 2 drainage catheter in the liver as before. The right subcapsular collection is unchanged measuring 2.9 x 1.5 cm. Another  subcapsular collections are tiny with peripheral ill-defined parenchymal hypodensity. Status post   cholecystectomy. Minimal ductal dilatation  Pancreas: Status post Whipple surgery. Residual distal pancreas shows marked atrophy with ductal dilatation  Spleen: Unremarkable.  Adrenals: Unremarkable.  Kidneys and Collecting Systems: Unremarkable.  Lymph Nodes: Unchanged borderline retroperitoneal lymph nodes probably reactive  Vessels: Unremarkable for age.  GI Tract/Mesentery and Peritoneal Cavity: G-tube is seen within the stomach.   Marked interval increase in perigastric collection measuring 1.2 cm previously was measuring 2.7 cm.  No significant change in in right hemidiaphragm collection anteriorly measuring 2 cm.  No new collections are seen. Mild stranding is seen in the peripancreatic region. Fluid-filled right colon which is mildly distended  Uterus/Ovaries: Status post hysterectomy  Bladder: Unremarkable.  Soft Tissues/Musculoskeletal: No acute abnormality.  IMPRESSION:  Overall, slight interval decrease in few of the collections as described above and few are unchanged. No new collections are seen.    Assessment and Plan:  ID problem(s):   - VRE Bacteremia   - Multiple and extensive Intra-abdominal abscesses growing VRE, enterobacter and candida glabrata, C.albicans    72 y.o. female with h/o recent PE and pancreatic cancer,  s/p whipple procedure (goal of cure), complicated by delayed gastric emptying, elevated LFTs and sepsis, malnutrition 2/2 early satiety and anorexia (requiring PEG), and multiple intraabdominal and hepatic abscesses and drains. Most recent sepsis episode on 5/2 requiring ICU observation/fluids/increased O2 demands, no pressors required, stabilized quickly, source was not definitively identified, returned to [facility] floor on 5/7.   (See extensive history in writer's previous notes).      Currently Cataleya continues on Ertapenem, Linezolid, and Caspofungin (Candida glabrata and albicans  resistant to fluconazole). Her WBC are wnl and she is afebrile. She has one remaining abdominal drain RUQ/flank to hepatic collection.  Following LFTs (on antifungals) which are trending up, and since she has not grown out gram negatives since 05/08/17, it is reasonable to discontinue Ertapenem which may increase ALT/AST/AlkPhos levels in some individuals.  Palliative continues to assist with pain control (abominal/generalized) and Primary/Nutrition continue to assist pt with improved oral/protein intake.      Recommendations:    Please discontinue ertapenem    Continue Linezolid IV 600 mg twice daily for VRE, watch for DDIs (serotonin syndrome), monitor for myelosuppression   Continue Caspofungin 50 mg IV daily for candida    Please follow CBC with diff and CMP 2-3 times weekly (or more often per primary team discretion).     ID Team 3 will continue to follow along    Patient seen with and plan discussed with ID attending Dr. Aundria Mems    Thank you for allowing [name] to participate in the care of this patient  Please call with questions/concerns.     Lonzo Candy, ANP-BC  Infectious Diseases, Team 3  Pager [phone]  Office Ph [phone]

## 2017-07-18 NOTE — Progress Notes (Signed)
Pt off the floor for CT scan.  Nonie Hoyer, RN

## 2017-07-18 NOTE — Plan of Care (Signed)
Problem: Safety  Goal: Patient will remain free of falls  Outcome: Progressing towards goal      Problem: Pain/Comfort  Goal: Patient's pain or discomfort is manageable  Outcome: Progressing towards goal      Problem: Mobility  Goal: Functional status is maintained or improved - Geriatric  Outcome: Progressing towards goal    Goal: Patient's functional status is maintained or improved  Outcome: Maintaining      Problem: Nutrition  Goal: Patient's nutritional status is maintained or improved  Outcome: Progressing towards goal    Goal: Nutritional status is maintained or improved - Geriatric  Outcome: Progressing towards goal      Problem: Cognitive function  Goal: Cognitive function will be maintained  Outcome: Maintaining    Goal: Cognitive function will be maintained or return to baseline  Outcome: Maintaining      Problem: Bowel Elimination  Goal: Elimination patterns are normal or improving  Outcome: Progressing towards goal      Problem: Post-Operative Hemodynamic Stability  Goal: Maintain Hemodynamic Stability  Outcome: Progressing towards goal      Problem: Post-Operative Complications  Goal: Prevent post-operative complications  Outcome: Progressing towards goal      Problem: Psychosocial  Goal: Demonstrates ability to cope with illness  Outcome: Progressing towards goal      Problem: Fluid and Electrolyte Imbalance  Goal: Fluid and Electrolyte imbalance  Outcome: Progressing towards goal

## 2017-07-18 NOTE — Progress Notes (Addendum)
Surgical Oncology Surgery Progress Note    Patient: Amanda Galloway    LOS: 111 days    Attending: [place].  Pain controlled, continued nausea  Tolerating TF and minimal PO, Denies flatus/BM     OBJECTIVE  Physical Exam:  Temp:  [36.2 C (97.2 F)-36.9 C (98.4 F)] 36.9 C (98.4 F)  Heart Rate:  [83-94] 89  Resp:  [16-20] 18  BP: (90-120)/(50-70) 110/60   GEN: no apparent distress  HEENT: NCAT, face symmetric  CHEST: nonlabored respirations  ABD: soft, distended, tender surrounding G-tube that is CDI, Perc Drain in hepatic abscess with drainage  NEURO/MOTOR: alert, appropriate  EXTREMITIES: atraumatic  VASCULAR: feet wwp, 2+ edema    Intake/Output:  05/15 0700 - 05/16 0659  In: 2953.9 [P.O.:300; I.V.:608.7]  Out: 2670 [Urine:2575; Drains:95]     Medications:  Current Facility-Administered Medications   Medication    magnesium sulfate 2,000 mg in 50 mL IVPB    sennosides (SENOKOT) 8.8 MG/5ML syrup 17.6 mg    methadone (DOLOPHINE) tablet 10 mg    And    methadone (DOLOPHINE) tablet 7.5 mg    PROSOURCE NO CARB liquid LIQD 30 mL    HYDROmorphone (DILAUDID) tablet 4 mg    metoclopramide (REGLAN) injection 10 mg    ondansetron (ZOFRAN) injection 4 mg    albuterol (PROVENTIL) nebulization 2.5 mg    polyethylene glycol (GLYCOLAX,MIRALAX) powder 17 g    acetaminophen (TYLENOL) 650 MG/20.3 ML solution 650 mg    butalbital-acetaminophen-caffeine (FIORICET, ESGIC) 50-325-40 MG per tablet 1 tablet    heparin 100 units/ml infusion    lactobacillus rhamnosus (GG) (CULTURELLE) capsule 1 each    methyl salicylate-menthol (BENGAY) topical cream    ertapenem (INVANZ) IV 1,000 mg    bisacodyl (DULCOLAX) suppository 10 mg    levothyroxine (SYNTHROID, LEVOTHROID) tablet 137 mcg    DULoxetine (CYMBALTA) DR capsule 20 mg    linezolid (ZYVOX) IVPB 600 mg    caspofungin (CANCIDAS) 50 mg in sodium chloride 0.9% 100 mL mini-bag    LORazepam (ATIVAN) 2 mg/mL injection 0.25 mg     naloxone (NARCAN) 0.4 mg/mL injection 0.1 mg    HYDROmorphone (DILAUDID) PCA 1 mg/mL    mineral oil-hydrophilic petrolatum (AQUAPHOR) ointment    sodium chloride 0.9 % flush 10 mL    sodium chloride 0.9 % flush 10 mL    sodium chloride 0.9 % FLUSH REQUIRED IF PATIENT HAS IV    dextrose 5 % FLUSH REQUIRED IF PATIENT HAS IV    lidocaine (LIDODERM) 5 % patch 1 patch    lidocaine (LIDODERM) 5 % patch 1 patch     Facility-Administered Medications Ordered in Other Encounters   Medication    etomidate (AMIDATE) 2 mg/mL injection    rocuronium (ZEMURON) 10 mg/mL injection         Laboratory values:   Recent Labs      07/18/17   0104  07/17/17   0026   Platelets   --   420*   INR  1.1   --      No components found with this basename: APTT, PT Recent Labs      07/18/17   0104   Sodium  129*   Potassium  4.0   Chloride  87*   CO2  32*   UN  8   Creatinine  0.56   Glucose  122*   Calcium  8.6   Magnesium  1.7  Phosphorus  4.9*    Recent Labs      07/18/17   0104   AST  106*   ALT  122*   Alk Phos  772*   Bilirubin,Total  <0.2   Bilirubin,Direct  <0.2     Recent Labs      07/18/17   0104   Total Protein  6.5   Albumin  2.6*     No results for input(s): AMY, LIP in the last 72 hours.   GLUCOSE:   No results for input(s): PGLU in the last 72 hours.  Imaging: US Doppler Vein Bilateral Lower Extremities    Result Date: 07/15/2017  No DVT in the bilateral lower extremities. Limited visualization of calf veins bilaterally due to soft tissue edema. END OF IMPRESSION [facility] submits this DICOM format image data and final report to the [facility], an independent secure electronic health information exchange, on a reciprocally searchable basis (with patient authorization) for a minimum of 12 months after exam date.       ASSESSMENT  Amanda Galloway is a 72 y.o. female with h/o recent PE and pancreatic cancer POD # 102  status post whipple procedure complicated by delayed gastric emptying.  NGT replaced on 04/04/17. UGI on  04/12/17 concerning for obstruction just beyond the [place] in the efferent limb.  Course complicated on 7/78/24 by rising LFTs and sepsis with CT concerning for cholangitis given biliary tree obstruction and PV compression due to hematoma and afferent limb distention in setting of PE treatment. Is s/p IR PTC on 04/16/17 and IR percutaneous aspiration of anterior abdominal fluid collection on 04/24/17. IR LUQ perc drain placement 3/1. IR anterior abdominal 23f perc drain placement 3/3. IR anterior perc drain into left hepatic collection 3/6.     PLAN  Diet tube feeding with tray ([facility] Regular  Dietary modular: ProSource NoCarb Liquid Protein 30 mL as tolerated, holding for nausea  Vent G-tube PRN nausea  Bowel regimen   Continue diuresis   Analgesia with PCA, Methadone, appreciate palliative care assistance   Continue Antibiotics per ID, follow up cultures   Will discuss Heparin infusion with Heme/Onc   OOB/ambulate, PT/OT   Dispo: SNF pending clinical improvment     Author: DKathrin Greathouse MD as of: 07/18/2017  at: 6:47 AM       [facility]-GI Attending Addendum:   I personally examined the patient, reviewed the notes, and discussed the plan of care with the residents and patient. Agree with detailed resident's note. Please see the note above for details of history, exam, labs, assessment/plan which reflect my input.     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   Stable fluid collection. Leakage resolved.   Attempting dietary advance. Tube feedings at goal. Begin cycling.   Hyperglycemia with insulin sliding scale and recurrent need for insulin.   Post operative anemia. Stable.  Stable. No need for transfusion.  Pulmonary embolism. Present on admission. Will ask Medical Oncology to weigh in on duration of  anticoagulation.   Hypoalbuminemia. Sever protein calorie malnutrition.  Tube feeding at goal. May cycle.   Depression. Cimbalta. Daily encouragement and reinforcement of goals.   Constipation . Aggressive regimen.   Physical debility. Requires rehabilitation setting.   Pain control. Changes per palliative care for Methadone and PCA     Plan  for scheduled CT scan today to assess for changes in collections.         Delano Metz, MD, FACS  Hepatobiliary, Pancreas & GI Surgery

## 2017-07-18 NOTE — Progress Notes (Signed)
Report Given   Gregary Signs, RN received handoff report from Amy on wcc5 unit for CT scan 10:32 AM.

## 2017-07-18 NOTE — Progress Notes (Signed)
Chart reviewed, visited w/ pt and dtr [name] late afternoon. Pt still w/ severe nausea, has been getting reglan ATC and TFs currently on hold. She had 4 stools today and some where loose. [name] c/o ongoing chronic abd pain though nausea is biggest complaints. She is using her prn zofran ~ 2x/d - reviewed recommendation to schedule ATC & pt/dtr in agreement. Pt has not been getting her scheduled bisacodyl suppository -reviewed w/ pt who is willing to take and advised RN of same. Pt also on senna BID and miralax daily via PEG - reviewed that team might consider lactulose vs miralax d/t less volume and more potent. Pt able to hit PCA button, only took 1 po dose dilaudid - considering her poor state today & not taking much PO, will leave PCA dilaudid at 0.6 mg.    Pt struggling to keep her spirits up, wants to be feeling better than she is. Today is [name]'s Bday and pt's son and grandson will be visiting this WE.    Recommendations  Schedule zofran 4 mg IV Q 6 hrs  Consider switching miralax to lactulose daily  Keep pain regimen as ordered for now: methadone 7.07/12/08 & Dilaudid PCA prn only 0.6 mg    Pt case discussed w/ nursing and covering provider. We will continue to follow along, please call w/ questions/concerns.    Spent 30 minutes w/ >50% time in collaboration/coordination of care.    Elroy Channel NP  Palliative Care Consult Service  Pager # [phone]

## 2017-07-19 ENCOUNTER — Inpatient Hospital Stay: Payer: Medicare (Managed Care)

## 2017-07-19 DIAGNOSIS — M19012 Primary osteoarthritis, left shoulder: Secondary | ICD-10-CM

## 2017-07-19 DIAGNOSIS — M85811 Other specified disorders of bone density and structure, right shoulder: Secondary | ICD-10-CM

## 2017-07-19 DIAGNOSIS — M85812 Other specified disorders of bone density and structure, left shoulder: Secondary | ICD-10-CM

## 2017-07-19 DIAGNOSIS — M19011 Primary osteoarthritis, right shoulder: Secondary | ICD-10-CM

## 2017-07-19 LAB — CBC AND DIFFERENTIAL
Baso # K/uL: 0.1 10*3/uL (ref 0.0–0.1)
Basophil %: 1.2 %
Eos # K/uL: 0.7 10*3/uL — ABNORMAL HIGH (ref 0.0–0.4)
Eosinophil %: 10 %
Hematocrit: 23 % — ABNORMAL LOW (ref 34–45)
Hemoglobin: 7.3 g/dL — ABNORMAL LOW (ref 11.2–15.7)
IMM Granulocytes #: 0 10*3/uL
IMM Granulocytes: 0.3 %
Lymph # K/uL: 1.2 10*3/uL (ref 1.2–3.7)
Lymphocyte %: 17.4 %
MCH: 29 pg/cell (ref 26–32)
MCHC: 32 g/dL (ref 32–36)
MCV: 90 fL (ref 79–95)
Mono # K/uL: 0.8 10*3/uL (ref 0.2–0.9)
Monocyte %: 11.3 %
Neut # K/uL: 4 10*3/uL (ref 1.6–6.1)
Nucl RBC # K/uL: 0 10*3/uL (ref 0.0–0.0)
Nucl RBC %: 0 /100 WBC (ref 0.0–0.2)
Platelets: 419 10*3/uL — ABNORMAL HIGH (ref 160–370)
RBC: 2.6 MIL/uL — ABNORMAL LOW (ref 3.9–5.2)
RDW: 18.6 % — ABNORMAL HIGH (ref 11.7–14.4)
Seg Neut %: 59.8 %
WBC: 6.7 10*3/uL (ref 4.0–10.0)

## 2017-07-19 LAB — COMPREHENSIVE METABOLIC PANEL
ALT: 144 U/L — ABNORMAL HIGH (ref 0–35)
AST: 135 U/L — ABNORMAL HIGH (ref 0–35)
Albumin: 2.8 g/dL — ABNORMAL LOW (ref 3.5–5.2)
Alk Phos: 901 U/L — ABNORMAL HIGH (ref 35–105)
Anion Gap: 10 (ref 7–16)
Bilirubin,Total: 0.3 mg/dL (ref 0.0–1.2)
CO2: 30 mmol/L — ABNORMAL HIGH (ref 20–28)
Calcium: 9.1 mg/dL (ref 8.6–10.2)
Chloride: 87 mmol/L — ABNORMAL LOW (ref 96–108)
Creatinine: 0.66 mg/dL (ref 0.51–0.95)
GFR,Black: 103 *
GFR,Caucasian: 89 *
Glucose: 95 mg/dL (ref 60–99)
Lab: 5 mg/dL — ABNORMAL LOW (ref 6–20)
Potassium: 4.3 mmol/L (ref 3.3–5.1)
Sodium: 127 mmol/L — ABNORMAL LOW (ref 133–145)
Total Protein: 6.8 g/dL (ref 6.3–7.7)

## 2017-07-19 LAB — APTT
aPTT: 77.8 s — ABNORMAL HIGH (ref 25.8–37.9)
aPTT: 81.7 s — ABNORMAL HIGH (ref 25.8–37.9)
aPTT: 59.3 s — ABNORMAL HIGH (ref 25.8–37.9)
aPTT: 56 s — ABNORMAL HIGH (ref 25.8–37.9)

## 2017-07-19 LAB — SEDIMENTATION RATE, AUTOMATED: Sedimentation Rate: 40 mm/hr — ABNORMAL HIGH (ref 0–30)

## 2017-07-19 LAB — MAGNESIUM: Magnesium: 1.9 mg/dL (ref 1.6–2.5)

## 2017-07-19 LAB — PLATELET COUNT: Platelets: 358 10*3/uL (ref 160–370)

## 2017-07-19 LAB — PHOSPHORUS: Phosphorus: 5.1 mg/dL — ABNORMAL HIGH (ref 2.7–4.5)

## 2017-07-19 LAB — CRP: CRP: 54 mg/L — ABNORMAL HIGH (ref 0–10)

## 2017-07-19 MED ORDER — HYDROMORPHONE PCA 1 MG/ML *WRAPPED*
INTRAMUSCULAR | Status: DC
Start: 2017-07-19 — End: 2017-07-25
  Filled 2017-07-19 (×5): qty 25

## 2017-07-19 MED ORDER — METHADONE HCL 5 MG PO TABS *I*
7.5000 mg | ORAL_TABLET | Freq: Every day | ORAL | Status: DC
Start: 2017-07-20 — End: 2017-07-30
  Administered 2017-07-20 – 2017-07-30 (×10): 7.5 mg via ORAL
  Filled 2017-07-19 (×10): qty 2

## 2017-07-19 MED ORDER — METHADONE HCL 10 MG PO TABS *I*
10.0000 mg | ORAL_TABLET | Freq: Two times a day (BID) | ORAL | Status: DC
Start: 2017-07-19 — End: 2017-07-30
  Administered 2017-07-19 – 2017-07-30 (×22): 10 mg via ORAL
  Filled 2017-07-19 (×22): qty 1

## 2017-07-19 NOTE — Progress Notes (Signed)
Surgical Oncology Surgery Progress Note    Patient: Amanda Galloway    LOS: 112 days    Attending: Ron Agee     INTERVAL HISTORY & SUBJECTIVE  NAE overnight. Patient reports up during night peeing a lot. She does believe her edema is improved. Denies any nausea yesterday with TFs. Pain controlled. She is very excited to have her son visit today.    CT yesterday showed no new collections, and slight decrease in existing hepatic collections.  Na 127 yesterday, await labs this am.     OBJECTIVE  Physical Exam:  Temp:  [36 C (96.8 F)-36.9 C (98.4 F)] 36.6 C (97.9 F)  Heart Rate:  [85-91] 85  Resp:  [16-18] 16  BP: (100-118)/(58-64) 118/62   GEN: no apparent distress  HEENT: NCAT, face symmetric  CHEST: nonlabored respirations  ABD: soft, distended, mildly tender surrounding G-tube that is CDI, right-sided perc drain with minimal s/s drainage  NEURO/MOTOR: alert, appropriate  EXTREMITIES: atraumatic  VASCULAR: feet wwp, 1+ peripheral edema in BLE    Intake/Output:  05/16 0700 - 05/17 0659  In: 2576.5 [P.O.:380; I.V.:398.6]  Out: 2387.5 [Urine:2295; Drains:92.5]     Medications:  Current Facility-Administered Medications   Medication    ondansetron (ZOFRAN) injection 4 mg    sennosides (SENOKOT) 8.8 MG/5ML syrup 17.6 mg    methadone (DOLOPHINE) tablet 10 mg    And    methadone (DOLOPHINE) tablet 7.5 mg    PROSOURCE NO CARB liquid LIQD 30 mL    HYDROmorphone (DILAUDID) tablet 4 mg    metoclopramide (REGLAN) injection 10 mg    albuterol (PROVENTIL) nebulization 2.5 mg    polyethylene glycol (GLYCOLAX,MIRALAX) powder 17 g    acetaminophen (TYLENOL) 650 MG/20.3 ML solution 650 mg    butalbital-acetaminophen-caffeine (FIORICET, ESGIC) 50-325-40 MG per tablet 1 tablet    heparin 100 units/ml infusion    lactobacillus rhamnosus (GG) (CULTURELLE) capsule 1 each    methyl salicylate-menthol (BENGAY) topical cream    bisacodyl (DULCOLAX) suppository 10 mg    levothyroxine (SYNTHROID, LEVOTHROID) tablet 137 mcg     DULoxetine (CYMBALTA) DR capsule 20 mg    linezolid (ZYVOX) IVPB 600 mg    caspofungin (CANCIDAS) 50 mg in sodium chloride 0.9% 100 mL mini-bag    LORazepam (ATIVAN) 2 mg/mL injection 0.25 mg    naloxone (NARCAN) 0.4 mg/mL injection 0.1 mg    HYDROmorphone (DILAUDID) PCA 1 mg/mL    mineral oil-hydrophilic petrolatum (AQUAPHOR) ointment    sodium chloride 0.9 % flush 10 mL    sodium chloride 0.9 % flush 10 mL    sodium chloride 0.9 % FLUSH REQUIRED IF PATIENT HAS IV    dextrose 5 % FLUSH REQUIRED IF PATIENT HAS IV    lidocaine (LIDODERM) 5 % patch 1 patch    lidocaine (LIDODERM) 5 % patch 1 patch     Facility-Administered Medications Ordered in Other Encounters   Medication    etomidate (AMIDATE) 2 mg/mL injection    rocuronium (ZEMURON) 10 mg/mL injection         Laboratory values:   Recent Labs      07/19/17   0107  07/18/17   0104  07/17/17   0026   Platelets  358   --   420*   INR   --   1.1   --    Sedimentation Rate  40*   --    --    CRP  54*   --    --  No components found with this basename: APTT, PT Recent Labs      07/18/17   1204  07/18/17   0104   Sodium  127*  129*   Potassium  4.2  4.0   Chloride  86*  87*   CO2  30*  32*   UN  7  8   Creatinine  0.62  0.56   Glucose  128*  122*   Calcium  9.1  8.6   Magnesium   --   1.7   Phosphorus   --   4.9*    Recent Labs      07/18/17   0104   AST  106*   ALT  122*   Alk Phos  772*   Bilirubin,Total  <0.2   Bilirubin,Direct  <0.2     Recent Labs      07/18/17   0104   Total Protein  6.5   Albumin  2.6*     No results for input(s): AMY, LIP in the last 72 hours.   GLUCOSE:   No results for input(s): PGLU in the last 72 hours.  Imaging: Ct Abdomen And Pelvis With Contrast    Result Date: 07/18/2017  Overall, slight interval decrease in few of the collections as described above and few are unchanged. No new collections are seen. END OF IMPRESSION       ASSESSMENT  Amanda Galloway is a 72 y.o. female with h/o recent PE and pancreatic cancer POD #  102  status post whipple procedure complicated by delayed gastric emptying.  NGT replaced on 04/04/17. UGI on 04/12/17 concerning for obstruction just beyond the [place] in the efferent limb.  Course complicated on 4/48/18 by rising LFTs and sepsis with CT concerning for cholangitis given biliary tree obstruction and PV compression due to hematoma and afferent limb distention in setting of PE treatment. Is s/p IR PTC on 04/16/17 and IR percutaneous aspiration of anterior abdominal fluid collection on 04/24/17. IR LUQ perc drain placement 3/1. IR anterior abdominal 47f perc drain placement 3/3. IR anterior perc drain into left hepatic collection 3/6.     PLAN  Diet tube feeding with tray ([facility] Regular  Dietary modular: ProSource NoCarb Liquid Protein 30 mL as tolerated, holding for nausea  Vent G-tube PRN nausea  Follow Na level this am, await labs  Bowel regimen   Continue diuresis prn   Analgesia with PCA, Methadone, appreciate palliative care assistance   Continue Antibiotics per ID, follow up cultures   Will discuss Heparin infusion with Heme/Onc   OOB/ambulate, PT/OT   Dispo: SNF pending clinical improvment     Author: MReynolds Bowl MD as of: 07/19/2017  at: 6:14 AM

## 2017-07-19 NOTE — Plan of Care (Signed)
Problem: Safety  Goal: Patient will remain free of falls  Outcome: Progressing towards goal      Problem: Pain/Comfort  Goal: Patient's pain or discomfort is manageable  Outcome: Progressing towards goal      Problem: Mobility  Goal: Functional status is maintained or improved - Geriatric  Outcome: Progressing towards goal    Goal: Patient's functional status is maintained or improved  Outcome: Progressing towards goal      Problem: Nutrition  Goal: Patient's nutritional status is maintained or improved  Outcome: Progressing towards goal      Problem: Cognitive function  Goal: Cognitive function will be maintained  Outcome: Maintaining    Goal: Cognitive function will be maintained or return to baseline  Outcome: Maintaining      Problem: Post-Operative Hemodynamic Stability  Goal: Maintain Hemodynamic Stability  Outcome: Progressing towards goal      Problem: Post-Operative Complications  Goal: Prevent post-operative complications  Outcome: Progressing towards goal      Problem: Fluid and Electrolyte Imbalance  Goal: Fluid and Electrolyte imbalance  Outcome: Progressing towards goal      Problem: GI Bleeding Elimination  Goal: Elimination of patterns are normal or improving  Outcome: Progressing towards goal

## 2017-07-19 NOTE — Progress Notes (Signed)
Palliative Care Progress Note    HPI: [name] w/ pancreatic CA s/p Whipple 1/25 (treatment for cure) w/ prolonged complicated hospitalization issues w/ infection, nutrition/appetite/nausea/constipation, s/p PEG w/ perf now resolved, most recent CT abd 5/16 showed no new fluid collections and decrease in existing hepatic collections. Pt resumed methadone on 5/8 and has been slowing weaning off her dilaudid PCA.     Subjective: "The nausea is definitely better, still no appetite and the pain is manageable. I wish I was feeling better for my son visiting, please don't decrease my pain medicine, I promised I'm not hitting the button too much"    Objective: chart reviewed, pt overall improved since seen yesterday but still quite down and wishing she was more up to par for her son and grandsons visit this WE; pt more spirited in expressing her frustration and lack of desire to get up to chair or walk etc; PCA reviewed and not receiving signif more than previous days and not too sleepy w/ methadone ATC so will hold on current dose for now in order to alleviate anxiety of having poorly controlled pain when son visiting - if pt does become more sleepy over WE then would increase freq of her PCA prn to 30 min prn    07/18/17 CT abd/pelvis w/ contrast  Overall, slight interval decrease in few of the collections as described above and few are unchanged. No new collections are seen.    Palliative Care ROS:  Pain   Moderate  Nausea   Mild  Anorexia   Severe  Anxiety   Moderate  Depression   Severe  Shortness of Breath   Mild  Tiredness/Fatigue   Mild  Drowsiness/Sleepiness   Mild  Constipation   no  Unable to Respond   no  Delirium   no   Last stool 5/17 x 4    Physical Examination:   BP: (100-118)/(60-64)   Temp:  [36 C (96.8 F)-36.9 C (98.4 F)]   Temp src: Temporal (05/17 1208)  Heart Rate:  [85-91]   Resp:  [16-18]   SpO2:  [90 %-96 %]   Gen appearance: older Caucasian female, laying in bed, reports abd pain moderate &  feels PCA is controlling, advocating to not decrease dose of dilaudid  Lungs: easy resp, RA  Heart: reg  Abd: large, soft, +BS, PEG (TFs currently on hold)  Extrem: edematous LEs  Skin:  dry and red LEs, flaky skin  Neuro: A+O x 3, ambulates w/ walker - needs encouragement to get OOB and up walking w/ assist    Assessment/Plan:  [name] w/ pancreatic CA s/p Whipple (treatment for cure) w/ prolonged hospitalization & most recent abd CT revealing decreasing fluid collections w/ no new collections. Pt reports nausea is much improved since scheduling zofran ATC yesterday and though not using signif more dilaudid prn via PCA, she is advocating not to decrease dose while her son is visiting this [place].    Pain/dyspnea  Dilaudid PCA  0.6 mg IV Q 15 min prn (received total 19.2 mg from 5/16 5 am to 5/17 6 am, equiv to 384 mg po ME) (pt advocating to keep dose same over WE while son visiting to ensure good pain control, if she becomes more sleepy then increase freq to 30 min prn)  Dilaudid 4 mg po Q 2hrs prn (x 1 on 5/16)   Methadone 7.07/12/08 mg po (total 27.5 mg/d) restarted 5/7 (ECG on 5/2 w/ QTc 392)  Albuterol neb  Q 6 hrs prn (none used)  Lidocaine patch daily x 2 (abd, LUE)  Acetaminophen 650 mg po Q 6 hrs prn fever     Anxiety/agitation/nausea/fatigue  Cymbalta 20 mg po/d  Metoclopramide 10 mg IV Q 6 hrs ATC  Ondansetron 4 mg IV Q 6 hrs ATC (started 5/16)  Ativan 0.25 mg IV Q 6 hrs prn (x 1 on 5/16)  Encouraged complementary adjuvant therapies for coping, meditation app, aromatherapy, gentle massage     Prevention of opioid induced constipation, last stool 5/16  Bisacodyl supp pr daily   Miralax 17 gm po daily  Senna 17.6 mg po BID    Dry flaky edematous Skin  Aquaphor prn   Eucerin prn for BLEs    GOC/prognosis  Full code  Dtr Rod Mae is HCP (very supportive and informed, former [facility] RN)  Goal currently is to prioritize pt's symptom control, long course though pt endorses agreement w/ continuing current  treatment plan, dispo to SNF in hopes of eventually making recovery back to independent living    Pt case discussed w/ RN and my PC attending Dr Simona Huh. No med changes for today, will plan to see pt on Monday.     We will continue to follow along. Please call my PC colleagues if pt needs to be seen over [place].    Total Time Spent 35 minutes:             >50% of time was spent in counseling and/or coordination of care.     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    Anemia D64.9    Hypothyroidism E03.9    Pancreatic cancer C25.9    Anemia D64.9     hx of Pulmonary embolism I26.99    Acute pulmonary insufficiency J98.4    Depression F32.9    Sepsis A41.9       No Known Allergies (drug, envir, food or latex)    Scheduled Meds:    [START ON 07/20/2017] methadone  7.5 mg Oral Daily    And    methadone  10 mg Oral BID    ondansetron  4 mg Intravenous Q6H    sennosides  17.6 mg Oral 2 times per day    PROSOURCE NO CARB  30 mL Oral Daily with breakfast    metoclopramide IV  10 mg Intravenous Q6H    polyethylene glycol  17 g Oral Daily    lactobacillus rhamnosus (GG)  1 capsule Oral Daily    bisacodyl  10 mg Rectal Daily    levothyroxine  137 mcg Oral Daily @ 0600    DULoxetine  20 mg Oral Q24H    linezolid  600 mg Intravenous Q12H    caspofungin  50 mg Intravenous Q24H    lidocaine  1 patch Transdermal Q24H    lidocaine  1 patch Transdermal Q24H       Continuous Infusions:    HYDROmorphone      heparin 1,200 Units/hr (07/19/17 0859)       PRN Meds:  HYDROmorphone, albuterol, acetaminophen, butalbital-acetaminophen-caffeine, methyl salicylate-menthol, LORazepam, naloxone, mineral oil-hydrophilic petrolatum, sodium chloride, sodium  chloride, sodium chloride, dextrose

## 2017-07-20 LAB — COMPREHENSIVE METABOLIC PANEL
ALT: 117 U/L — ABNORMAL HIGH (ref 0–35)
AST: 85 U/L — ABNORMAL HIGH (ref 0–35)
Albumin: 2.9 g/dL — ABNORMAL LOW (ref 3.5–5.2)
Alk Phos: 816 U/L — ABNORMAL HIGH (ref 35–105)
Anion Gap: 11 (ref 7–16)
Bilirubin,Total: 0.2 mg/dL (ref 0.0–1.2)
CO2: 31 mmol/L — ABNORMAL HIGH (ref 20–28)
Calcium: 9 mg/dL (ref 8.6–10.2)
Chloride: 86 mmol/L — ABNORMAL LOW (ref 96–108)
Creatinine: 0.69 mg/dL (ref 0.51–0.95)
GFR,Black: 101 *
GFR,Caucasian: 88 *
Glucose: 103 mg/dL — ABNORMAL HIGH (ref 60–99)
Lab: 6 mg/dL (ref 6–20)
Potassium: 4.2 mmol/L (ref 3.3–5.1)
Sodium: 128 mmol/L — ABNORMAL LOW (ref 133–145)
Total Protein: 6.8 g/dL (ref 6.3–7.7)

## 2017-07-20 LAB — HOLD SST

## 2017-07-20 LAB — HOLD LAVENDER

## 2017-07-20 LAB — APTT: aPTT: 62.6 s — ABNORMAL HIGH (ref 25.8–37.9)

## 2017-07-20 NOTE — Plan of Care (Signed)
Problem: Safety  Goal: Patient will remain free of falls  Outcome: Maintaining      Problem: Pain/Comfort  Goal: Patient's pain or discomfort is manageable  Outcome: Progressing towards goal    Goal: Patient's pain or discomfort is manageable  Outcome: Progressing towards goal      Problem: Mobility  Goal: Functional status is maintained or improved - Geriatric  Outcome: Progressing towards goal    Goal: Patient's functional status is maintained or improved  Outcome: Progressing towards goal      Problem: Cognitive function  Goal: Cognitive function will be maintained  Outcome: Maintaining      Problem: Bowel Elimination  Goal: Elimination patterns are normal or improving  Outcome: Progressing towards goal      Problem: Post-Operative Hemodynamic Stability  Goal: Maintain Hemodynamic Stability  Outcome: Progressing towards goal      Problem: Post-Operative Bowel Elimination  Goal: Elimination pattern is normal or improving  Outcome: Progressing towards goal      Problem: Fluid and Electrolyte Imbalance  Goal: Fluid and Electrolyte imbalance  Outcome: Progressing towards goal      Problem: GI Bleeding Elimination  Goal: Elimination of patterns are normal or improving  Outcome: Progressing towards goal      Problem: Post-Operative Bladder Elimination  Goal: Patient is able to empty bladder or return to baseline  Outcome: Maintaining

## 2017-07-20 NOTE — Progress Notes (Addendum)
Surgical Oncology Surgery Progress Note    Patient: Amanda Galloway    LOS: 113 days    Attending: [place].   Pain controlled on dPCA.  Tolerating small amount of regular diet and TFs, endorses decreased appetite        OBJECTIVE  Physical Exam:  Temp:  [36.5 C (97.7 F)-36.9 C (98.4 F)] 36.5 C (97.7 F)  Heart Rate:  [83-96] 92  Resp:  [16] 16  BP: (100-118)/(58-64) 108/58   GEN: no apparent distress  HEENT: NCAT, face symmetric  CHEST: nonlabored respirations on 1LNC  ABD: soft, distended, mildly tender surrounding G-tube that is CDI, right-sided perc drain with minimal bilious drainage  NEURO/MOTOR: alert, appropriate  EXTREMITIES: atraumatic  VASCULAR: feet wwp, 1+ peripheral edema in BLE    Intake/Output:  05/17 0700 - 05/18 0659  In: 2126.1 [P.O.:390; I.V.:392.1]  Out: 1850 [Urine:1725; Drains:115]     Medications:  Current Facility-Administered Medications   Medication    HYDROmorphone (DILAUDID) PCA 1 mg/mL    methadone (DOLOPHINE) tablet 7.5 mg    And    methadone (DOLOPHINE) tablet 10 mg    ondansetron (ZOFRAN) injection 4 mg    sennosides (SENOKOT) 8.8 MG/5ML syrup 17.6 mg    PROSOURCE NO CARB liquid LIQD 30 mL    HYDROmorphone (DILAUDID) tablet 4 mg    metoclopramide (REGLAN) injection 10 mg    albuterol (PROVENTIL) nebulization 2.5 mg    polyethylene glycol (GLYCOLAX,MIRALAX) powder 17 g    acetaminophen (TYLENOL) 650 MG/20.3 ML solution 650 mg    butalbital-acetaminophen-caffeine (FIORICET, ESGIC) 50-325-40 MG per tablet 1 tablet    heparin 100 units/ml infusion    lactobacillus rhamnosus (GG) (CULTURELLE) capsule 1 each    methyl salicylate-menthol (BENGAY) topical cream    bisacodyl (DULCOLAX) suppository 10 mg    levothyroxine (SYNTHROID, LEVOTHROID) tablet 137 mcg    DULoxetine (CYMBALTA) DR capsule 20 mg    linezolid (ZYVOX) IVPB 600 mg    caspofungin (CANCIDAS) 50 mg in sodium chloride 0.9% 100 mL mini-bag    LORazepam (ATIVAN) 2  mg/mL injection 0.25 mg    naloxone (NARCAN) 0.4 mg/mL injection 0.1 mg    mineral oil-hydrophilic petrolatum (AQUAPHOR) ointment    sodium chloride 0.9 % flush 10 mL    sodium chloride 0.9 % flush 10 mL    sodium chloride 0.9 % FLUSH REQUIRED IF PATIENT HAS IV    dextrose 5 % FLUSH REQUIRED IF PATIENT HAS IV    lidocaine (LIDODERM) 5 % patch 1 patch    lidocaine (LIDODERM) 5 % patch 1 patch     Facility-Administered Medications Ordered in Other Encounters   Medication    etomidate (AMIDATE) 2 mg/mL injection    rocuronium (ZEMURON) 10 mg/mL injection         Laboratory values:   Recent Labs      07/19/17   0948  07/19/17   0107  07/18/17   0104   WBC  6.7   --    --    Hemoglobin  7.3*   --    --    Hematocrit  23*   --    --    Platelets  419*  358   --    INR   --    --   1.1   Sedimentation Rate   --   40*   --    CRP   --   54*   --  No components found with this basename: APTT, PT Recent Labs      07/20/17   0010  07/19/17   0948   07/18/17   0104   Sodium  128*  127*   < >  129*   Potassium  4.2  4.3   < >  4.0   Chloride  86*  87*   < >  87*   CO2  31*  30*   < >  32*   UN  6  5*   < >  8   Creatinine  0.69  0.66   < >  0.56   Glucose  103*  95   < >  122*   Calcium  9.0  9.1   < >  8.6   Magnesium   --   1.9   --   1.7   Phosphorus   --   5.1*   --   4.9*    < > = values in this interval not displayed.    Recent Labs      07/20/17   0010  07/19/17   0948  07/18/17   0104   AST  85*  135*  106*   ALT  117*  144*  122*   Alk Phos  816*  901*  772*   Bilirubin,Total  0.2  0.3  <0.2   Bilirubin,Direct   --    --   <0.2     Recent Labs      07/20/17   0010  07/19/17   0948   Total Protein  6.8  6.8   Albumin  2.9*  2.8*     No results for input(s): AMY, LIP in the last 72 hours.   GLUCOSE:   No results for input(s): PGLU in the last 72 hours.  Imaging: Ct Abdomen And Pelvis With Contrast    Result Date: 07/19/2017  Overall, slight interval decrease in few of the collections as described above and  few are unchanged. No new collections are seen. END OF IMPRESSION [facility] submits this DICOM format image data and final report to the [facility], an independent secure electronic health information exchange, on a reciprocally searchable basis (with patient authorization) for a minimum of 12 months after exam date.       ASSESSMENT  Amanda Galloway is a 72 y.o. female with h/o recent PE and pancreatic cancer POD # 102  status post whipple procedure complicated by delayed gastric emptying.  NGT replaced on 04/04/17. UGI on 04/12/17 concerning for obstruction just beyond the [place] in the efferent limb.  Course complicated on 11/16/76 by rising LFTs and sepsis with CT concerning for cholangitis given biliary tree obstruction and PV compression due to hematoma and afferent limb distention in setting of PE treatment. Is s/p IR PTC on 04/16/17 and IR percutaneous aspiration of anterior abdominal fluid collection on 04/24/17. IR LUQ perc drain placement 3/1. IR anterior abdominal 82f perc drain placement 3/3. IR anterior perc drain into left hepatic collection 3/6.     PLAN  Dietary modular: ProSource NoCarb Liquid Protein 30 mL  Diet tube feeding with tray ([facility] Regular as tolerated, holding for nausea  Vent G-tube PRN nausea  Follow Na, 128 from 127   Bowel regimen   Continue diuresis prn   Analgesia with PCA, Methadone, appreciate palliative care assistance   Continue Antibiotics per ID, Caspofungin and linezolid   Will discuss Heparin infusion with Heme/Onc   OOB/ambulate, PT/OT  Dispo: SNF pending clinical improvment     Author: Tonia Ghent, MD as of: 07/20/2017  at: 7:38 AM

## 2017-07-21 LAB — PLATELET COUNT: Platelets: 345 10*3/uL (ref 160–370)

## 2017-07-21 LAB — APTT: aPTT: 56.1 s — ABNORMAL HIGH (ref 25.8–37.9)

## 2017-07-21 NOTE — Plan of Care (Signed)
Cognitive function     Cognitive function will be maintained Maintaining     Cognitive function will be maintained or return to baseline Maintaining        Post-Operative Bladder Elimination     Patient is able to empty bladder or return to baseline Maintaining        Safety     Patient will remain free of falls Maintaining          Bowel Elimination     Elimination patterns are normal or improving Progressing towards goal        Fluid and Electrolyte Imbalance     Fluid and Electrolyte imbalance Progressing towards goal        GI Bleeding Elimination     Elimination of patterns are normal or improving Progressing towards goal        Mobility     Functional status is maintained or improved - Geriatric Progressing towards goal     Patient's functional status is maintained or improved Progressing towards goal        Nutrition     Patient's nutritional status is maintained or improved Progressing towards goal     Nutritional status is maintained or improved - Geriatric Progressing towards goal        Pain/Comfort     Patient's pain or discomfort is manageable Progressing towards goal     Patient's pain or discomfort is manageable Progressing towards goal        Post-Operative Bowel Elimination     Elimination pattern is normal or improving Progressing towards goal        Post-Operative Complications     Prevent post-operative complications Progressing towards goal        Post-Operative Hemodynamic Stability     Maintain Hemodynamic Stability Progressing towards goal        Psychosocial     Demonstrates ability to cope with illness Progressing towards goal

## 2017-07-21 NOTE — Progress Notes (Signed)
Surgical Oncology Surgery Progress Note    Patient: Amanda Galloway    LOS: 114 days    Attending: [place], AVSS   Pain controlled on dPCA.  Tolerating TFs @ 25cc/hr and regular diet, PO: 210  Right perc drain 120cc bilious drainage  +OOB/ambulating yesterday        OBJECTIVE  Physical Exam:  Temp:  [35.8 C (96.4 F)-36.8 C (98.2 F)] 36.8 C (98.2 F)  Heart Rate:  [84-95] 95  Resp:  [16] 16  BP: (98-122)/(55-64) 118/61   GEN: no apparent distress  HEENT: NCAT, face symmetric  CHEST: nonlabored respirations on 3LNC  ABD: soft, distended, mildly tender surrounding G-tube that is CDI, right-sided perc drain with minimal bilious drainage  NEURO/MOTOR: alert, appropriate  EXTREMITIES: atraumatic  VASCULAR: feet wwp, 1+ peripheral edema in BLE    Intake/Output:  05/18 0700 - 05/19 0659  In: 1253.3 [P.O.:210; I.V.:256.6]  Out: 1570 [Urine:1450; Drains:120]     Medications:  Current Facility-Administered Medications   Medication    HYDROmorphone (DILAUDID) PCA 1 mg/mL    methadone (DOLOPHINE) tablet 7.5 mg    And    methadone (DOLOPHINE) tablet 10 mg    ondansetron (ZOFRAN) injection 4 mg    sennosides (SENOKOT) 8.8 MG/5ML syrup 17.6 mg    PROSOURCE NO CARB liquid LIQD 30 mL    HYDROmorphone (DILAUDID) tablet 4 mg    metoclopramide (REGLAN) injection 10 mg    albuterol (PROVENTIL) nebulization 2.5 mg    polyethylene glycol (GLYCOLAX,MIRALAX) powder 17 g    acetaminophen (TYLENOL) 650 MG/20.3 ML solution 650 mg    butalbital-acetaminophen-caffeine (FIORICET, ESGIC) 50-325-40 MG per tablet 1 tablet    heparin 100 units/ml infusion    lactobacillus rhamnosus (GG) (CULTURELLE) capsule 1 each    methyl salicylate-menthol (BENGAY) topical cream    bisacodyl (DULCOLAX) suppository 10 mg    levothyroxine (SYNTHROID, LEVOTHROID) tablet 137 mcg    DULoxetine (CYMBALTA) DR capsule 20 mg    linezolid (ZYVOX) IVPB 600 mg    caspofungin (CANCIDAS) 50 mg in sodium chloride  0.9% 100 mL mini-bag    LORazepam (ATIVAN) 2 mg/mL injection 0.25 mg    naloxone (NARCAN) 0.4 mg/mL injection 0.1 mg    mineral oil-hydrophilic petrolatum (AQUAPHOR) ointment    sodium chloride 0.9 % flush 10 mL    sodium chloride 0.9 % flush 10 mL    sodium chloride 0.9 % FLUSH REQUIRED IF PATIENT HAS IV    dextrose 5 % FLUSH REQUIRED IF PATIENT HAS IV    lidocaine (LIDODERM) 5 % patch 1 patch    lidocaine (LIDODERM) 5 % patch 1 patch     Facility-Administered Medications Ordered in Other Encounters   Medication    etomidate (AMIDATE) 2 mg/mL injection    rocuronium (ZEMURON) 10 mg/mL injection         Laboratory values:   Recent Labs      07/21/17   0030  07/19/17   0948  07/19/17   0107   WBC   --   6.7   --    Hemoglobin   --   7.3*   --    Hematocrit   --   23*   --    Platelets  345  419*  358   Sedimentation Rate   --    --   40*   CRP   --    --   54*  No components found with this basename: APTT, PT Recent Labs      07/20/17   0010  07/19/17   0948   Sodium  128*  127*   Potassium  4.2  4.3   Chloride  86*  87*   CO2  31*  30*   UN  6  5*   Creatinine  0.69  0.66   Glucose  103*  95   Calcium  9.0  9.1   Magnesium   --   1.9   Phosphorus   --   5.1*    Recent Labs      07/20/17   0010  07/19/17   0948   AST  85*  135*   ALT  117*  144*   Alk Phos  816*  901*   Bilirubin,Total  0.2  0.3     Recent Labs      07/20/17   0010  07/19/17   0948   Total Protein  6.8  6.8   Albumin  2.9*  2.8*     No results for input(s): AMY, LIP in the last 72 hours.   GLUCOSE:   No results for input(s): PGLU in the last 72 hours.  Imaging: Ct Abdomen And Pelvis With Contrast    Result Date: 07/19/2017  Overall, slight interval decrease in few of the collections as described above and few are unchanged. No new collections are seen. END OF IMPRESSION [facility] submits this DICOM format image data and final report to the [facility], an independent secure electronic health information exchange, on a reciprocally  searchable basis (with patient authorization) for a minimum of 12 months after exam date.       ASSESSMENT  Amanda Galloway is a 72 y.o. female with h/o recent PE and pancreatic cancer POD # 102  status post whipple procedure complicated by delayed gastric emptying.  NGT replaced on 04/04/17. UGI on 04/12/17 concerning for obstruction just beyond the [place] in the efferent limb.  Course complicated on 05/04/58 by rising LFTs and sepsis with CT concerning for cholangitis given biliary tree obstruction and PV compression due to hematoma and afferent limb distention in setting of PE treatment. Is s/p IR PTC on 04/16/17 and IR percutaneous aspiration of anterior abdominal fluid collection on 04/24/17. IR LUQ perc drain placement 3/1. IR anterior abdominal 74f perc drain placement 3/3. IR anterior perc drain into left hepatic collection 3/6.     PLAN  Dietary modular: ProSource NoCarb Liquid Protein 30 mL  Diet tube feeding with tray ([facility] Regular as tolerated, holding for nausea; will increase TFs to 50cc/hr, daughter and patient hesitant will reiterate importance of nutrition and increasing slowly   Vent G-tube PRN nausea  Stable mild hyponatremia 128 (127), labs MWF   Bowel regimen   Continue diuresis prn   Analgesia with PCA, Methadone, appreciate palliative care assistance   Continue Antibiotics per ID, Caspofungin and linezolid, appreciate recs    Remains on heparin gtt    OOB/ambulate, PT/OT   Dispo: SNF pending clinical improvment     Author: ATonia Ghent MD as of: 07/21/2017  at: 6:26 AM

## 2017-07-21 NOTE — Progress Notes (Signed)
1630 pt and family encouraged pt to go outside. Pt previously encouraged by Dr Ron Agee to get outside to boost spirit. Pt was accompanied on life pack with writer outdoors. Pt HR remained NSR throughout time. 1745 pt and writer returned to unit. Pt currently resting in chair eating dinner.    Waverly Ferrari, RN

## 2017-07-22 LAB — PHOSPHORUS: Phosphorus: 4.3 mg/dL (ref 2.7–4.5)

## 2017-07-22 LAB — COMPREHENSIVE METABOLIC PANEL
ALT: 115 U/L — ABNORMAL HIGH (ref 0–35)
AST: 152 U/L — ABNORMAL HIGH (ref 0–35)
Albumin: 2.7 g/dL — ABNORMAL LOW (ref 3.5–5.2)
Alk Phos: 764 U/L — ABNORMAL HIGH (ref 35–105)
Anion Gap: 13 (ref 7–16)
Bilirubin,Total: 0.5 mg/dL (ref 0.0–1.2)
CO2: 25 mmol/L (ref 20–28)
Calcium: 8.4 mg/dL — ABNORMAL LOW (ref 8.6–10.2)
Chloride: 87 mmol/L — ABNORMAL LOW (ref 96–108)
Creatinine: 0.66 mg/dL (ref 0.51–0.95)
GFR,Black: 103 *
GFR,Caucasian: 89 *
Glucose: 175 mg/dL — ABNORMAL HIGH (ref 60–99)
Lab: 5 mg/dL — ABNORMAL LOW (ref 6–20)
Potassium: 4.1 mmol/L (ref 3.3–5.1)
Sodium: 125 mmol/L — ABNORMAL LOW (ref 133–145)
Total Protein: 6.2 g/dL — ABNORMAL LOW (ref 6.3–7.7)

## 2017-07-22 LAB — CBC AND DIFFERENTIAL
Baso # K/uL: 0.1 10*3/uL (ref 0.0–0.1)
Basophil %: 0.6 %
Eos # K/uL: 0.3 10*3/uL (ref 0.0–0.4)
Eosinophil %: 3.8 %
Hematocrit: 22 % — ABNORMAL LOW (ref 34–45)
Hemoglobin: 6.8 g/dL — ABNORMAL LOW (ref 11.2–15.7)
IMM Granulocytes #: 0 10*3/uL
IMM Granulocytes: 0.4 %
Lymph # K/uL: 0.8 10*3/uL — ABNORMAL LOW (ref 1.2–3.7)
Lymphocyte %: 10.3 %
MCH: 29 pg/cell (ref 26–32)
MCHC: 32 g/dL (ref 32–36)
MCV: 92 fL (ref 79–95)
Mono # K/uL: 0.8 10*3/uL (ref 0.2–0.9)
Monocyte %: 10 %
Neut # K/uL: 6.1 10*3/uL (ref 1.6–6.1)
Nucl RBC # K/uL: 0 10*3/uL (ref 0.0–0.0)
Nucl RBC %: 0 /100 WBC (ref 0.0–0.2)
Platelets: 342 10*3/uL (ref 160–370)
RBC: 2.4 MIL/uL — ABNORMAL LOW (ref 3.9–5.2)
RDW: 18.6 % — ABNORMAL HIGH (ref 11.7–14.4)
Seg Neut %: 74.9 %
WBC: 8.1 10*3/uL (ref 4.0–10.0)

## 2017-07-22 LAB — TRIGLYCERIDES: Triglycerides: 114 mg/dL

## 2017-07-22 LAB — PROTIME-INR
INR: 1.1 (ref 0.9–1.1)
Protime: 12.7 s (ref 10.0–12.9)

## 2017-07-22 LAB — PREALBUMIN: Prealbumin: 13 mg/dL — ABNORMAL LOW (ref 20–40)

## 2017-07-22 LAB — MAGNESIUM: Magnesium: 1.3 mg/dL — ABNORMAL LOW (ref 1.6–2.5)

## 2017-07-22 LAB — APTT: aPTT: 45.6 s — ABNORMAL HIGH (ref 25.8–37.9)

## 2017-07-22 MED ORDER — FUROSEMIDE 10 MG/ML IJ SOLN *I*
20.0000 mg | Freq: Once | INTRAMUSCULAR | Status: AC
Start: 2017-07-22 — End: 2017-07-22
  Administered 2017-07-22: 20 mg via INTRAVENOUS
  Filled 2017-07-22: qty 2

## 2017-07-22 MED ORDER — LINEZOLID 600 MG PO TABS *I*
600.0000 mg | ORAL_TABLET | Freq: Two times a day (BID) | ORAL | Status: DC
Start: 2017-07-22 — End: 2017-08-12
  Administered 2017-07-22 – 2017-08-12 (×41): 600 mg via ORAL
  Filled 2017-07-22 (×44): qty 1

## 2017-07-22 MED ORDER — SODIUM CHLORIDE 1 GM PO TABS *I*
1.0000 g | ORAL_TABLET | Freq: Three times a day (TID) | ORAL | Status: DC
Start: 2017-07-22 — End: 2017-07-23
  Administered 2017-07-22 (×2): 1 g via GASTROSTOMY
  Filled 2017-07-22 (×4): qty 1

## 2017-07-22 MED ORDER — HYDROMORPHONE HCL 4 MG PO TABS *I*
8.0000 mg | ORAL_TABLET | ORAL | Status: DC | PRN
Start: 2017-07-22 — End: 2017-07-24
  Administered 2017-07-23: 8 mg via ORAL
  Filled 2017-07-22: qty 2

## 2017-07-22 MED ORDER — PREGABALIN 75 MG PO CAPS *I*
75.0000 mg | ORAL_CAPSULE | Freq: Every day | ORAL | Status: AC
Start: 2017-07-22 — End: 2017-07-29
  Administered 2017-07-22 – 2017-07-28 (×6): 75 mg via ORAL
  Filled 2017-07-22 (×7): qty 1

## 2017-07-22 MED ORDER — MAGNESIUM SULFATE 2 GM IN 50 ML *WRAPPED*
2000.0000 mg | INTRAVENOUS | Status: AC
Start: 2017-07-22 — End: 2017-07-22
  Administered 2017-07-22 (×2): 2000 mg via INTRAVENOUS
  Filled 2017-07-22 (×2): qty 50

## 2017-07-22 NOTE — Plan of Care (Signed)
Bowel Elimination     Elimination patterns are normal or improving Progressing towards goal        Cognitive function     Cognitive function will be maintained Progressing towards goal     Cognitive function will be maintained or return to baseline Progressing towards goal        Fluid and Electrolyte Imbalance     Fluid and Electrolyte imbalance Progressing towards goal        GI Bleeding Elimination     Elimination of patterns are normal or improving Progressing towards goal        Mobility     Functional status is maintained or improved - Geriatric Progressing towards goal     Patient's functional status is maintained or improved Progressing towards goal        Nutrition     Patient's nutritional status is maintained or improved Progressing towards goal     Nutritional status is maintained or improved - Geriatric Progressing towards goal        Pain/Comfort     Patient's pain or discomfort is manageable Progressing towards goal     Patient's pain or discomfort is manageable Progressing towards goal        Post-Operative Bladder Elimination     Patient is able to empty bladder or return to baseline Progressing towards goal        Post-Operative Bowel Elimination     Elimination pattern is normal or improving Progressing towards goal        Post-Operative Complications     Prevent post-operative complications Progressing towards goal        Post-Operative Hemodynamic Stability     Maintain Hemodynamic Stability Progressing towards goal        Psychosocial     Demonstrates ability to cope with illness Progressing towards goal        Safety     Patient will remain free of falls Progressing towards goal

## 2017-07-22 NOTE — Progress Notes (Addendum)
Surgical Oncology Surgery Progress Note    Patient: Amanda Galloway    LOS: 115 days    Attending: [place]  [place], AVSS   Denies pain this AM although states she feels worse today than yesterday, but cannot tell me why  Tolerating TFs @ 50cc/hr and regular diet, PO: 310; denies N/V  Right perc drain 150cc bilious drainage  +OOB/ambulating outside yesterday        OBJECTIVE  Physical Exam:  Temp:  [36 C (96.8 F)-36.8 C (98.2 F)] 36 C (96.8 F)  Heart Rate:  [84-90] 90  Resp:  [16-17] 16  BP: (100-118)/(58-70) 110/60   GEN: no apparent distress  HEENT: NCAT, face symmetric  CHEST: nonlabored respirations on 2LNC  ABD: soft, distended, mildly tender surrounding G-tube that is CDI, right-sided perc drain with minimal bilious drainage  NEURO/MOTOR: alert, appropriate  EXTREMITIES: atraumatic  VASCULAR: feet wwp, 1+ peripheral edema in BLE    Intake/Output:  05/19 0700 - 05/20 0659  In: 2279.5 [P.O.:340; I.V.:374.5]  Out: 650 [Urine:500; Drains:150]     Medications:  Current Facility-Administered Medications   Medication    HYDROmorphone (DILAUDID) PCA 1 mg/mL    methadone (DOLOPHINE) tablet 7.5 mg    And    methadone (DOLOPHINE) tablet 10 mg    ondansetron (ZOFRAN) injection 4 mg    sennosides (SENOKOT) 8.8 MG/5ML syrup 17.6 mg    PROSOURCE NO CARB liquid LIQD 30 mL    HYDROmorphone (DILAUDID) tablet 4 mg    metoclopramide (REGLAN) injection 10 mg    albuterol (PROVENTIL) nebulization 2.5 mg    polyethylene glycol (GLYCOLAX,MIRALAX) powder 17 g    acetaminophen (TYLENOL) 650 MG/20.3 ML solution 650 mg    heparin 100 units/ml infusion    lactobacillus rhamnosus (GG) (CULTURELLE) capsule 1 each    methyl salicylate-menthol (BENGAY) topical cream    bisacodyl (DULCOLAX) suppository 10 mg    levothyroxine (SYNTHROID, LEVOTHROID) tablet 137 mcg    DULoxetine (CYMBALTA) DR capsule 20 mg    linezolid (ZYVOX) IVPB 600 mg    caspofungin (CANCIDAS) 50 mg in sodium chloride  0.9% 100 mL mini-bag    naloxone (NARCAN) 0.4 mg/mL injection 0.1 mg    mineral oil-hydrophilic petrolatum (AQUAPHOR) ointment    sodium chloride 0.9 % flush 10 mL    sodium chloride 0.9 % flush 10 mL    sodium chloride 0.9 % FLUSH REQUIRED IF PATIENT HAS IV    dextrose 5 % FLUSH REQUIRED IF PATIENT HAS IV    lidocaine (LIDODERM) 5 % patch 1 patch    lidocaine (LIDODERM) 5 % patch 1 patch     Facility-Administered Medications Ordered in Other Encounters   Medication    etomidate (AMIDATE) 2 mg/mL injection    rocuronium (ZEMURON) 10 mg/mL injection         Laboratory values:   Recent Labs      07/22/17   0029  07/21/17   0030  07/19/17   0948   WBC   --    --   6.7   Hemoglobin   --    --   7.3*   Hematocrit   --    --   23*   Platelets   --   345  419*   INR  1.1   --    --      No components found with this basename: APTT, PT Recent Labs      07/20/17  0010  07/19/17   0948   Sodium  128*  127*   Potassium  4.2  4.3   Chloride  86*  87*   CO2  31*  30*   UN  6  5*   Creatinine  0.69  0.66   Glucose  103*  95   Calcium  9.0  9.1   Magnesium   --   1.9   Phosphorus   --   5.1*    Recent Labs      07/20/17   0010  07/19/17   0948   AST  85*  135*   ALT  117*  144*   Alk Phos  816*  901*   Bilirubin,Total  0.2  0.3     Recent Labs      07/22/17   0029  07/20/17   0010  07/19/17   0948   Total Protein   --   6.8  6.8   Albumin   --   2.9*  2.8*   Prealbumin  13*   --    --      No results for input(s): AMY, LIP in the last 72 hours.   GLUCOSE:   No results for input(s): PGLU in the last 72 hours.  Imaging: No results found.     ASSESSMENT  Amanda Galloway is a 72 y.o. female with h/o recent PE and pancreatic cancer POD # 102  status post whipple procedure complicated by delayed gastric emptying.  NGT replaced on 04/04/17. UGI on 04/12/17 concerning for obstruction just beyond the [place] in the efferent limb.  Course complicated on 6/96/29 by rising LFTs and sepsis with CT concerning for cholangitis given biliary tree  obstruction and PV compression due to hematoma and afferent limb distention in setting of PE treatment. Is s/p IR PTC on 04/16/17 and IR percutaneous aspiration of anterior abdominal fluid collection on 04/24/17. IR LUQ perc drain placement 3/1. IR anterior abdominal 9f perc drain placement 3/3. IR anterior perc drain into left hepatic collection 3/6.     PLAN  Will discuss removal of right perc drain, pause hep gtt 2-3hr prior  Regular diet and TFs as tolerated, holding for nausea; will increase TFs to 60cc/hr, daughter and patient hesitant will reiterate importance of nutrition and increasing slowly   Vent G-tube PRN nausea  Stable mild hyponatremia 128 (127), labs MWF   Bowel regimen   Continue diuresis prn, small dose lasix this AM if labs stable    Analgesia with PCA, Methadone, appreciate palliative care assistance   Continue Antibiotics per ID, Caspofungin and linezolid, appreciate recs    Remains on heparin gtt    OOB/ambulate, PT/OT   Dispo: SNF pending clinical improvment     Author: ATonia Ghent MD as of: 07/22/2017  at: 6:34 AM           [facility]-GI Attending Addendum:   I personally examined the patient, reviewed the notes, and discussed the plan of care with the residents and patient. Agree with detailed resident's note. Please see the note above for details of history, exam, labs, assessment/plan which reflect my input.     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   Stable fluid collection. Leakage resolved.   Attempting dietary advance. Tube feedings at goal. Begin cycling.   Hyperglycemia with insulin  sliding scale and recurrent need for insulin.   Post operative anemia. Stable.  Stable. No need for transfusion.  Pulmonary embolism. Present on admission. Will ask Medical Oncology to  weigh in on duration of anticoagulation.   Hypoalbuminemia. Sever protein calorie malnutrition.  Tube feeding at goal. May cycle.   Depression. Cimbalta. Daily encouragement and reinforcement of goals.   Constipation . Aggressive regimen.   Physical debility. Requires rehabilitation setting.   Pain control. Changes per palliative care for Methadone and PCA     CT scan without new collections. Will discontinue drain today. Mild delusions.    Delano Metz, MD, FACS  Hepatobiliary, Pancreas & GI Surgery

## 2017-07-22 NOTE — Progress Notes (Signed)
Infectious Diseases (Team 3) Follow Up Note    Personally reviewed chart, labs, medications. Patient seen.     CC:  F/U VRE bacteremia, Liver and abdominal abscesses, s/p Enterobacter cloacae bacteremia, Intra-abdominal abscesses positive for VRE, enterobacter (last grew out 3/6), candida glabrata and candida albicans (both resistant to fluconazole)     Subjective: states she is not feeling well, vague as to why, sat for a while without answering and her daughter added that patient's shoulders are bothering her quite a bit, daughter voiced concern that patient's 02 sat drops of supplemental oxygen. Pt does endorse SOB on exertion. c/o abdominal pain, intermittent nausea, poor po intake (though notes indicate oral intake as improved a little).  Had a large BM earlier today.  Last abdominal drain was pulled today (right side).     ROS: Reviewed 6 systems in detail, see above    Current Meds:  Scheduled Meds:   magnesium sulfate  2,000 mg Intravenous Q1H    methadone  7.5 mg Oral Daily    And    methadone  10 mg Oral BID    ondansetron  4 mg Intravenous Q6H    sennosides  17.6 mg Oral 2 times per day    PROSOURCE NO CARB  30 mL Oral Daily with breakfast    metoclopramide IV  10 mg Intravenous Q6H    polyethylene glycol  17 g Oral Daily    lactobacillus rhamnosus (GG)  1 capsule Oral Daily    bisacodyl  10 mg Rectal Daily    levothyroxine  137 mcg Oral Daily @ 0600    DULoxetine  20 mg Oral Q24H    linezolid  600 mg Intravenous Q12H    caspofungin  50 mg Intravenous Q24H    lidocaine  1 patch Transdermal Q24H    lidocaine  1 patch Transdermal Q24H     Continuous Infusions:   HYDROmorphone      heparin Stopped (07/22/17 0727)     PRN Meds:.   calcium carbonate  1,000 mg Oral BID PRN    HYDROmorphone PF  0.5 mg Intravenous Q1H PRN    Or    HYDROmorphone PF  1 mg Intravenous Q1H PRN    LORazepam  0.5 mg Oral Q6H PRN    heparin lock flush  50 Units Intracatheter PRN    ondansetron  4 mg Intravenous  Q6H PRN     Objective:  BP: (106-118)/(50-70)   Temp:  [36 C (96.8 F)-36.8 C (98.2 F)]   Temp src: Temporal (05/20 0812)  Heart Rate:  [84-91]   Resp:  [16-17]   SpO2:  [87 %-96 %]     Physical Exam:              General Appearance: sitting up in chair, frowning and grimacing. Visiting with daughter.   HEENT: Sclera anicteric, MMM, OP clear  Pulm: diminished bases, crackles bibasilar, WOB unlabored, 02 via NC at 2 L.    CV: RRR, S1S2, no M/R/G, continued LE edema, no increase  Abdomen:  Softly distended, TTP  RLQ, PEG intact    Extremities:WWP  Skin:Warm and dry; B/L LEs slightly less erythematous  Neuro: alert, appropriate, oriented to place and person, confused to sequence of events at times. Lines/Drains/Tubes: PICC LUE (4/11), PEG (4/12) - sites benign.       Recent Labs  Lab 07/22/17  0905 07/21/17  0030 07/19/17  0948   WBC 8.1  --  6.7   Hemoglobin 6.8*  --  7.3*   Hematocrit 22*  --  23*   Platelets 342 345 419*   Seg Neut % 74.9  --  59.8   Lymphocyte % 10.3  --  17.4   Monocyte % 10.0  --  11.3   Eosinophil % 3.8  --  10.0       Recent Labs  Lab 07/22/17  0905 07/20/17  0010 07/19/17  0948   Sodium 125* 128* 127*   Potassium 4.1 4.2 4.3   CO2 25 31* 30*   UN 5* 6 5*   Creatinine 0.66 0.69 0.66   Glucose 175* 103* 95   Calcium 8.4* 9.0 9.1         Lab results: 07/22/17  0905 07/20/17  0010 07/19/17  0948 07/18/17  0104 07/15/17  0105   Total Protein 6.2* 6.8 6.8 6.5 5.9*   Albumin 2.7* 2.9* 2.8* 2.6* 2.5*   ALT 115* 117* 144* 122* 97*   AST 152* 85* 135* 106* 81*   Alk Phos 764* 816* 901* 772* 514*   Bilirubin,Total 0.5 0.2 0.3 <0.2 <0.2       Lab Results  Component Value Date/Time   CRP 54 (H) 07/19/2017 0107   CRP 46 (H) 07/12/2017 0029   CRP 154 (H) 07/05/2017 0913       Lab Results  Component Value Date/Time   Sedimentation Rate 40 (H) 07/19/2017 0107   Sedimentation Rate >=130 (!) 07/12/2017 0029   Sedimentation Rate 73 (H) 07/05/2017 0913       Micro:   1/25: Intraoperative bile: GS 0 pmns, no  organisms, Cx: no growth  2/12: 1 set of blood cultures from the right Mediport (no peripheral bld was sent): positive for Enterobacter cloacae complex in 70 hrs, sensitive to zosyn.   2/18 abdominal GS had >25 PMNs, many GPC in pairs/chains, many GNB and cultures 4+ Enterobacter cloacae complex. The cultures also grew 1+ Candida glabrata   2/21: BCx from the Right peripheral IV, Mediport, L IJ:  No growth  05/03/17: IR-guided I&D with drain placement of the left upper quadrant collection: GS > 25 PMNs, many GNB and GN diplococci, Cultures with 4+ Enterobacter cloacae complex (resistant to Zosyn and CTX), C. Albicans and C. Glabrata, and Candida tropicalis  05/04/17:    1 set of blood cultures from left IJ: VRE at 15.3 TTP. Daptomycin sensitivities: MIC 47mg/ml (dose dependent),  Linezolid   1 set from periphery: VRE and Enterobacter cloacae at 14.8 TTP   05/05/17:  IR-guided I&D with drain placement of the intrahepatic collection: GS 1-10 PMNs, very few GPC in pairs. Fungal cultures with Candida glabrata  05/05/17: Blood cultures from periphery, Mediport and IJ: no growth  05/04/17: Urine cultures obtained (for unclear reason): no growth  05/08/17: Abscess GS: >25 PMNs, many GPC in chains, few GNB. Aerobic cultures growing 2+ Enterobacter (ertapenem, Cipro, TMP/SMX-sensitive) and 3+VRE, Fungal: C. Glabrata (sens to caspo, vori and dose dependent sens to fluconazole)  06/06/17: 2 sets of BCx (1 set each from periphery and Mediport) - taken after restarting ertapenem: IVAD grew VRE ttp 18.2 hours,  Periphery (Left arm) grew VRE ttp 17.3.  06/07/17 Pleural fluid (left) GS 1-10 PMN's, 1-10 Nucleated white cells, No organisms seen and 1604 nucleated cells. No growth on culture.   06/07/17 Abdominal abscess: labs cancelled - no specimens received.   06/08/17: Mediport BC + for VRE in 23 hrs (R-Amp, R- tetracycline per lab, suppressed result, R- doxycycline;  S-Linezolid, S-Dapto dose dependent),  Right hand peripheral cx: No growth    06/10/17: Blood culture 2/2: 1 periphery (RHand), 1 from Mediport before removed: no growth  06/11/17: Blood Culture 2/2 (both periphery) - no growth   06/19/17:  Perigastric fluid #1:  GS 10-25 PMNs -  GPB, GNB, GPC in pairs. 1+ C. Glabrata (R- fluconazole, voriconazole, S- Caspofungin); 2+ Enterococcus faecium (R- amp, PenG, Vanco; S- Linezolid)  06/19/17:  Liver fluid collection:  GS: 0 PMNs, Fungal and AFB stains negative, 1+ enterococcus faecium (R- Amp, PenG, Vanco, S- Dapto dose dependent, Linezolid)  06/19/17 : Perigastric fluid #2: GS: >25 PMNs, Gram negative bacilli; 3+ C. Glabrata.   07/04/17 U/A negative  07/04/17 Blood Cultures 3/3 (1 set PICC proximal port, 2 sets periphery): NGTD  07/04/17 MRSA amplification Nares: negative  07/05/17 Blood Culture 3/3 (2 PICC, 1 Periph) - NGTD  07/05/17 Liver Abscess (IR drain)- <1PMNs, GPC in prs and chains, 1 colony candida albicans (preliminary), 4+ Enterococcus faecium - two isolates with one now resistant to Daptomycin, the other dose dependent.      Susceptibility      Enterococcus faecium (3)     MIC BP MIC     Ampicillin   Resistant     Daptomycin 8.0 mcg/mL Resistant      Linezolid 2.0 mcg/mL Sensitive      Penicillin-G   Resistant     Vancomycin   Resistant             Susceptibility      Enterococcus faecium (4)     MIC BP MIC     Ampicillin   Resistant     Daptomycin 4.0 mcg/mL Susceptible - Dose Dependent      Linezolid 2.0 mcg/mL Sensitive      Penicillin-G   Resistant     Vancomycin   Resistant             Narrative     LIVER ABSCESS      Specimen Collected: 07/05/17 17:29 Last Resulted: 07/10/17 12:07              Imaging/Other Relevant Diagnostics:     07/19/17 shoulders plain films:  - LEFT shoulder FINDINGS/IMPRESSION: There is no evidence of a displaced fracture or dislocation. Mild to moderate degenerative hypertrophy of the acromioclavicular joint. Mild degenerative changes of the glenohumeral joint. Diffuse severe osteopenia. Regional soft tissues are grossly  unremarkable   - RIGHT Shoulder FINDINGS/IMPRESSION: There is no evidence of a displaced fracture or dislocation. Moderate degenerative hypertrophy of the [facility] joint. Mild degenerative changes of the glenohumeral joint. Diffuse osteopenia. Regional soft tissues are grossly unremarkable    Assessment and Plan:  ID problem(s):   - VRE Bacteremia   - Multiple and extensive Intra-abdominal abscesses growing VRE, enterobacter and Candida glabrata, C.albicans    72 y.o. female with h/o recent PE and pancreatic cancer, s/p whipple procedure (goal of cure), complicated by delayed gastric emptying, elevated LFTs and sepsis, malnutrition 2/2 early satiety and anorexia (requiring PEG), and multiple intraabdominal and hepatic abscesses and drains. Most recent sepsis episode on 5/2 requiring ICU observation/fluids/increased O2 demands, no pressors required, stabilized quickly, source was not definitively identified, returned to [facility] floor on 5/7.  She has since slowly and steadily continued to improve.  The TPN weaned to off, she is tolerating TFs and taking in small amounts of food from regular diet tray.  Her last abdominal drain was pulled today (07/22/17).  Ertapenem was discontinued on 07/18/17 (2/2 LFTs fluctuating upwards  and she had not grown out gram negatives in 2 months ).  Palliative continues to assist with patient's pain control issues and Primary/Nutrition continue to assist pt with improved oral/protein intake.     Deasiah remains stable off the Ertapenem.  She continues on Linezolid (VRE) and Caspofungin (Candida glabrata and albicans resistant to fluconazole).  She is stable from an ID perspective, WBC are wnl, all VS are stable. She has increased activity, walking in nurses station and hallways this past weekend. She complains for generalized body pain, particularly shoulders and when I have visited, I've noticed she restricts her movement sitting on one position for periods of time when sitting in her chair.  I  encouraged her to move her arms more and try to do stretches both with goal for reducing joint and myofascial pain and for improved lung expansion, as well.  Daughter states PT is following patient primarily for lower body strength.  Perhaps patient would benefit from OT.  Daughter is concerned about patient's need for supplemental oxygen and pt does endorse SOB with minimal exertion. Patient has poor inspiratory effort and bibasilar crackles.  Incentive spirometer was encouraged.  If oxygen demands increase, would consider a CXR.        Primary team asked about oral options for antibiotics, as discussed with C. Mee Hives NP, patient may switch Linezolid from IV to oral if she is tolerating PO.  There are no good oral options for antifungal agents given the C.glabrata she grew was resistant to vori and fluconazole, and her C. albicans resistant to fluconazole. We are hesitant to stop her Caspofungin at this time, recommend at this time to continue Caspo (if LFTs do not continue to elevate) until she is discharged to rehab. Casp should not hold up discharge when patient is medically ready for discharge.   LFTs are again elevated today, will continue to monitor.       Recommendations:    Continue Linezolid (IV or PO) 600 mg twice daily for VRE, watch for DDIs (serotonin syndrome), monitor for myelosuppression   Continue Caspofungin 50 mg IV daily for candida - Please follow liver chemistries every other day for now.    Please follow CBC with diff weekly and CMP as indicated but at minimum weekly  (or more often per primary team discretion).     ID Team 3 will continue to follow along    Case and plan discussed with ID attending Dr. Aundria Mems    Thank you for allowing [name] to participate in the care of this patient  Please call with questions/concerns.     Lonzo Candy, ANP-BC  Infectious Diseases, Team 3  Pager [phone]  Office Ph [phone]

## 2017-07-22 NOTE — Progress Notes (Signed)
Pt AVSS, ate only a few bites of dinner, OOB to chair for several hours, PCA decreased and lyrica given per order, pt maintained good pain relief this evening.  Pt somewhat disoriented early in the shift when she said " help me, I need to go".   When asked where she replied "to the hospital", pt reoriented and Dr. Ron Agee aware.  Pt currently resting comfortably in bed with daughter at bedside.  Cont current care.

## 2017-07-22 NOTE — Progress Notes (Signed)
Percutaneous Drain Removal      Type of Drain: perc drain    Location: Right abdominal     Indications decreased output    Procedure Details   Procedure explained to patient. Drainage tube was disconnected from the hub. One suture connecting drain to skin was removed with a suture removal kit. Provider cut coil to ensure it was released. A Kelly was clamped on tube to ensure drain did not move back into patients abdomen. Provider pulled drain gently- no resistance occured. Pressure held at site. The tip was examined and intact upon removal. The area was inspected and without redness or drainage. A 2x2 and Tegaderm was placed over exit site.     Plan/Orders Change dressing as needed.    Amanda Gilbert, NP  12:07 PM

## 2017-07-22 NOTE — Progress Notes (Signed)
Palliative Care Progress Note    HPI: [name] w/ pancreatic CA s/p Whipple 1/25 (treatment for cure) w/ prolonged complicated hospitalization issues w/ infection, nutrition/appetite/nausea/constipation, s/p PEG w/ perf now resolved, most recent CT abd 5/16 showed no new fluid collections and decrease in existing hepatic collections. Pt resumed methadone on 5/8 and has been slowing weaning off her dilaudid PCA. All drains now removed, receiving TFs at night to optimize appetite during day.    Subjective: "The pain is ok and I did have a good weekend but they're giving me more TFs now and I feel bloated. My shoulders are bothering me"    Objective: chart reviewed, pt case discussed w/ covering provider, pt now has all perc drains removed, she was more active and ambulatory over WE when her son [place] and grandson Kian visited; Probation officer visited w/ pt and dtr [name] this afternoon - says she feels miserable, mostly c/o bloating & no appetite r/t TFs and dtr [name] was encouraged that pt ate better over WE (spaghetti & meatballs, chocolate) when she was getting trickle TFs at 25 ml/hr; pt states pain controlled w/ current regimen, agreeable to decreasing dilaudid dose on PCA from 0.6 mg to 0.4 mg Q 15 min prn & encouraged to use PO dilaudid prn instead as goal to get off IV dilaudid; reviewed w/ pt & dtr that pt has used total 19.2 mg of IV dilaudid in last 24 hrs or 96 mg po dilaudid equivalent - 10% prn dose would be 9.6 mg; recommended increase PO dilaudid dose from 4 to 8 mg po prn -pt agreeable; discussed increasing pt's methadone also as last increase was nearly 1 week ago, pt's dtr advocating to start lyrica as pt's shoulders have been painful r/t fibromyalgia and feels lyrica helped best in past - pt in agreement and opts to hold off on increasing methadone for now w/ plan to start lyrica 75 mg po daily - will increase to BID in 1 wk if tolerating (watch for drug interactions w/ linezolid)    If opt to increase  methadone in future would need to recheck ECG QTc as pt on more than one drug w/ SE prolonged QTc    Palliative Care ROS:  Pain   Moderate  Nausea   Mild  Anorexia   Severe  Anxiety   Moderate  Depression   Severe  Shortness of Breath   Mild  Tiredness/Fatigue   Mild  Drowsiness/Sleepiness   Mild  Constipation   no  Unable to Respond   no  Delirium   no   Last stool 5/20 x 2    Physical Examination:   BP: (106-118)/(50-70)   Temp:  [36 C (96.8 F)-36.8 C (98.2 F)]   Temp src: Temporal (05/20 1217)  Heart Rate:  [84-104]   Resp:  [16-17]   SpO2:  [87 %-96 %]   Gen appearance: 72 year old Caucasian female, sitting up in recliner, reports abd pain mild to moderate - just had perc drain removed; feels pain controlled w/ PCA though agreeable to wean on PCA w/ increase in PO dilaudid prn; wants to start lyrica for fibromyalgia  Lungs: easy resp, RA  Heart: reg  Abd: large, soft, +BS, PEG (TFs currently at bedtime)  Extrem: edematous LEs  Skin:  dry and red LEs, flaky skin  Neuro: A+O x 3, ambulates w/ walker - needs encouragement to get OOB and up walking w/ assist    Assessment/Plan:  [name] w/ pancreatic CA s/p  Whipple (treatment for cure) w/ prolonged hospitalization & most recent abd CT revealing decreasing fluid collections w/ no new collections. Pt struggling w/ abd bloating she contributes to increased TFs. Pt agreeable to wean of dilaudid prn dose via PCA w/ simultaneous increase in dilaudid PO prn dose.     Pain/dyspnea  Dilaudid PCA  0.6 mg IV Q 15 min prn (received total 19.2 mg from 5/19 630 am to 5/20 5 am, equiv to 384 mg po ME or 96 mg po dilaudid) -wean to 0.4 mg IV Q 15 min prn  Dilaudid 4 mg po Q 2hrs prn (x 1 on 5/19, x 1 on 5/20) -increase to 8 mg po Q 2 hrs prn (~equiv to 8% total dilaudid use/day)  Start lyrica 75 mg po daily for fibromyalgia -if tolerates, then plan increase to BID on 5/27  Methadone 7.07/12/08 mg po (total 27.5 mg/d) restarted 5/7, last increase 5/14 (ECG on 5/2 w/ QTc  392)  Albuterol neb Q 6 hrs prn (none used)  Lidocaine patch daily x 2 (abd, LUE)  Acetaminophen 650 mg po Q 6 hrs prn fever     Anxiety/agitation/nausea/fatigue  Cymbalta 20 mg po/d  Metoclopramide 10 mg IV Q 6 hrs ATC  Ondansetron 4 mg IV Q 6 hrs ATC (started 5/16)  Ativan 0.25 mg IV Q 6 hrs prn (x 1 on 5/16)  Encouraged complementary adjuvant therapies for coping, meditation app, aromatherapy, gentle massage     Prevention of opioid induced constipation, last stool 5/20  Bisacodyl supp pr daily   Miralax 17 gm po daily  Senna 17.6 mg po BID    Dry flaky edematous Skin  Aquaphor prn   Eucerin prn for BLEs    GOC/prognosis  Full code  Dtr Rod Mae is HCP (very supportive and informed, former [facility] RN)  Goal currently is to prioritize pt's symptom control, long course though pt endorses agreement w/ continuing current treatment plan, dispo to SNF in hopes of eventually making recovery back to independent living    Pt case discussed w/ covering provider. Will plan to see again on Wednesday.      We will continue to follow along. Please call my PC colleagues if questions/concerns or if pt needs to be seen.    Total Time Spent 55 minutes:             >50% of time was spent in counseling and/or coordination of care.     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    Anemia D64.9    Hypothyroidism E03.9    Pancreatic cancer C25.9    Anemia D64.9     hx of Pulmonary embolism I26.99    Acute pulmonary insufficiency J98.4    Depression F32.9    Sepsis A41.9       No Known Allergies (drug, envir, food or latex)    Scheduled Meds:    sodium chloride  1 g Per G Tube TID PC    linezolid  600 mg Oral 2 times per day    methadone  7.5 mg Oral  Daily    And    methadone  10 mg Oral BID    ondansetron  4 mg Intravenous Q6H    sennosides  17.6  mg Oral 2 times per day    PROSOURCE NO CARB  30 mL Oral Daily with breakfast    metoclopramide IV  10 mg Intravenous Q6H    polyethylene glycol  17 g Oral Daily    lactobacillus rhamnosus (GG)  1 capsule Oral Daily    bisacodyl  10 mg Rectal Daily    levothyroxine  137 mcg Oral Daily @ 0600    DULoxetine  20 mg Oral Q24H    caspofungin  50 mg Intravenous Q24H    lidocaine  1 patch Transdermal Q24H    lidocaine  1 patch Transdermal Q24H       Continuous Infusions:    HYDROmorphone      heparin Stopped (07/22/17 0727)       PRN Meds:  HYDROmorphone, albuterol, acetaminophen, methyl salicylate-menthol, naloxone, mineral oil-hydrophilic petrolatum, sodium chloride, sodium chloride, sodium chloride, dextrose

## 2017-07-23 ENCOUNTER — Other Ambulatory Visit: Payer: Self-pay | Admitting: Cardiology

## 2017-07-23 LAB — BASIC METABOLIC PANEL
Anion Gap: 12 (ref 7–16)
Anion Gap: 14 (ref 7–16)
CO2: 28 mmol/L (ref 20–28)
CO2: 27 mmol/L (ref 20–28)
Calcium: 8.6 mg/dL (ref 8.6–10.2)
Calcium: 8.9 mg/dL (ref 8.6–10.2)
Chloride: 85 mmol/L — ABNORMAL LOW (ref 96–108)
Chloride: 89 mmol/L — ABNORMAL LOW (ref 96–108)
Creatinine: 0.82 mg/dL (ref 0.51–0.95)
Creatinine: 0.8 mg/dL (ref 0.51–0.95)
GFR,Black: 83 *
GFR,Black: 86 *
GFR,Caucasian: 72 *
GFR,Caucasian: 74 *
Glucose: 130 mg/dL — ABNORMAL HIGH (ref 60–99)
Glucose: 108 mg/dL — ABNORMAL HIGH (ref 60–99)
Lab: 7 mg/dL (ref 6–20)
Lab: 10 mg/dL (ref 6–20)
Potassium: 4.3 mmol/L (ref 3.3–5.1)
Potassium: 4.4 mmol/L (ref 3.3–5.1)
Sodium: 125 mmol/L — ABNORMAL LOW (ref 133–145)
Sodium: 130 mmol/L — ABNORMAL LOW (ref 133–145)

## 2017-07-23 LAB — CBC AND DIFFERENTIAL
Baso # K/uL: 0 10*3/uL (ref 0.0–0.1)
Baso # K/uL: 0 10*3/uL (ref 0.0–0.1)
Basophil %: 0.2 %
Basophil %: 0.3 %
Eos # K/uL: 0.1 10*3/uL (ref 0.0–0.4)
Eos # K/uL: 0.3 10*3/uL (ref 0.0–0.4)
Eosinophil %: 1.1 %
Eosinophil %: 3 %
Hematocrit: 21 % — ABNORMAL LOW (ref 34–45)
Hematocrit: 22 % — ABNORMAL LOW (ref 34–45)
Hemoglobin: 6.6 g/dL — ABNORMAL LOW (ref 11.2–15.7)
Hemoglobin: 6.9 g/dL — ABNORMAL LOW (ref 11.2–15.7)
IMM Granulocytes #: 0 10*3/uL
IMM Granulocytes #: 0 10*3/uL
IMM Granulocytes: 0.3 %
IMM Granulocytes: 0.4 %
Lymph # K/uL: 0.5 10*3/uL — ABNORMAL LOW (ref 1.2–3.7)
Lymph # K/uL: 0.6 10*3/uL — ABNORMAL LOW (ref 1.2–3.7)
Lymphocyte %: 5.7 %
Lymphocyte %: 6.7 %
MCH: 29 pg/cell (ref 26–32)
MCH: 29 pg/cell (ref 26–32)
MCHC: 31 g/dL — ABNORMAL LOW (ref 32–36)
MCHC: 32 g/dL (ref 32–36)
MCV: 91 fL (ref 79–95)
MCV: 93 fL (ref 79–95)
Mono # K/uL: 1 10*3/uL — ABNORMAL HIGH (ref 0.2–0.9)
Mono # K/uL: 1.2 10*3/uL — ABNORMAL HIGH (ref 0.2–0.9)
Monocyte %: 10.9 %
Monocyte %: 12.6 %
Neut # K/uL: 7.3 10*3/uL — ABNORMAL HIGH (ref 1.6–6.1)
Neut # K/uL: 7.8 10*3/uL — ABNORMAL HIGH (ref 1.6–6.1)
Nucl RBC # K/uL: 0 10*3/uL (ref 0.0–0.0)
Nucl RBC # K/uL: 0 10*3/uL (ref 0.0–0.0)
Nucl RBC %: 0 /100 WBC (ref 0.0–0.2)
Nucl RBC %: 0 /100 WBC (ref 0.0–0.2)
Platelets: 323 10*3/uL (ref 160–370)
Platelets: 325 10*3/uL (ref 160–370)
RBC: 2.3 MIL/uL — ABNORMAL LOW (ref 3.9–5.2)
RBC: 2.4 MIL/uL — ABNORMAL LOW (ref 3.9–5.2)
RDW: 18.9 % — ABNORMAL HIGH (ref 11.7–14.4)
RDW: 19.2 % — ABNORMAL HIGH (ref 11.7–14.4)
Seg Neut %: 77.1 %
Seg Neut %: 81.7 %
WBC: 9.4 10*3/uL (ref 4.0–10.0)
WBC: 9.5 10*3/uL (ref 4.0–10.0)

## 2017-07-23 LAB — OSMOLALITY: Osmolality: 266 mOsm/Kg H2O — ABNORMAL LOW (ref 278–297)

## 2017-07-23 LAB — OSMOLALITY, URINE: Osmolality,UR: 320 mOsm/Kg H2O (ref 300–900)

## 2017-07-23 LAB — SODIUM, URINE: Sodium,UR: 70 mmol/L

## 2017-07-23 LAB — TSH: TSH: 20.58 u[IU]/mL — ABNORMAL HIGH (ref 0.27–4.20)

## 2017-07-23 LAB — APTT: aPTT: 29.9 s (ref 25.8–37.9)

## 2017-07-23 LAB — MAGNESIUM: Magnesium: 1.9 mg/dL (ref 1.6–2.5)

## 2017-07-23 MED ORDER — SODIUM CHLORIDE 1 GM PO TABS *I*
2.0000 g | ORAL_TABLET | Freq: Three times a day (TID) | ORAL | Status: DC
Start: 2017-07-23 — End: 2017-07-24
  Administered 2017-07-23 (×3): 2 g via GASTROSTOMY
  Filled 2017-07-23 (×5): qty 2

## 2017-07-23 MED ORDER — FUROSEMIDE 10 MG/ML IJ SOLN *I*
20.0000 mg | Freq: Once | INTRAMUSCULAR | Status: AC
Start: 2017-07-23 — End: 2017-07-23
  Administered 2017-07-23: 20 mg via INTRAVENOUS
  Filled 2017-07-23: qty 2

## 2017-07-23 MED ORDER — ENOXAPARIN SODIUM 40 MG/0.4ML IJ SOSY *I*
40.0000 mg | PREFILLED_SYRINGE | Freq: Every day | INTRAMUSCULAR | Status: DC
Start: 2017-07-23 — End: 2017-08-24
  Administered 2017-07-23 – 2017-08-23 (×32): 40 mg via SUBCUTANEOUS
  Filled 2017-07-23 (×32): qty 0.4

## 2017-07-23 NOTE — Plan of Care (Signed)
Bowel Elimination     Elimination patterns are normal or improving Maintaining        Cognitive function     Cognitive function will be maintained Maintaining     Cognitive function will be maintained or return to baseline Maintaining        Fluid and Electrolyte Imbalance     Fluid and Electrolyte imbalance Maintaining        GI Bleeding Elimination     Elimination of patterns are normal or improving Maintaining        Mobility     Functional status is maintained or improved - Geriatric Maintaining     Patient's functional status is maintained or improved Maintaining        Nutrition     Patient's nutritional status is maintained or improved Maintaining     Nutritional status is maintained or improved - Geriatric Maintaining        Pain/Comfort     Patient's pain or discomfort is manageable Maintaining     Patient's pain or discomfort is manageable Maintaining        Post-Operative Bladder Elimination     Patient is able to empty bladder or return to baseline Maintaining        Post-Operative Bowel Elimination     Elimination pattern is normal or improving Maintaining        Post-Operative Complications     Prevent post-operative complications Maintaining        Post-Operative Hemodynamic Stability     Maintain Hemodynamic Stability Maintaining        Psychosocial     Demonstrates ability to cope with illness Maintaining        Safety     Patient will remain free of falls Maintaining

## 2017-07-23 NOTE — Progress Notes (Signed)
Palliative Care Progress Note  Brief HPI: Amanda Galloway is a [name] w/ pancreatic CA s/p Whipple 1/25 (treatment for cure) w/ prolonged complicated hospitalization issues w/ infection, nutrition/appetite/nausea/constipation, s/p PEG w/ perf now resolved, most recent CT abd 5/16 showed no new fluid collections and decrease in existing hepatic collections. Pt resumed methadone on 5/8 and has been slowing weaning off her dilaudid PCA. All drains now removed, receiving TFs at night to optimize appetite during day.    Significant 24h Events: remains anemic and hyponatremic. Started on sodium chloride tablets and FWR.      Subjective: Sitting in chair finishing lunch. Smiling and upbeat today. Her daughter [name] arrived as I was talking to her.     Palliative Care ROS: Denies abdominal pain but still with significant bilateral shoulder pain. Eating but "not really hungry". Does get SOB with activity when not wearing oxygen and c/o being fatigued.     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    Anemia D64.9    Hypothyroidism E03.9    Pancreatic cancer C25.9    Anemia D64.9     hx of Pulmonary embolism I26.99    Acute pulmonary insufficiency J98.4    Depression F32.9    Sepsis A41.9       No Known Allergies (drug, envir, food or latex)    Scheduled Meds:    sodium chloride  2 g Per G Tube TID PC    enoxaparin  40 mg Subcutaneous Daily @ 2100    linezolid  600 mg Oral 2 times per day    pregabalin  75 mg Oral Daily    methadone  7.5 mg Oral Daily    And    methadone  10 mg Oral BID    ondansetron  4 mg Intravenous Q6H    sennosides  17.6 mg Oral 2 times per day    PROSOURCE NO CARB  30 mL Oral Daily with breakfast    metoclopramide IV  10 mg Intravenous Q6H    polyethylene glycol  17 g Oral Daily    lactobacillus rhamnosus (GG)  1 capsule Oral Daily     bisacodyl  10 mg Rectal Daily    levothyroxine  137 mcg Oral Daily @ 0600    DULoxetine  20 mg Oral Q24H    caspofungin  50 mg Intravenous Q24H    lidocaine  1 patch Transdermal Q24H    lidocaine  1 patch Transdermal Q24H       Continuous Infusions:    HYDROmorphone         PRN Meds:  HYDROmorphone, albuterol, acetaminophen, methyl salicylate-menthol, naloxone, mineral oil-hydrophilic petrolatum, sodium chloride, sodium chloride, sodium chloride, dextrose    Past 24h PRN Usage:  37 demands and 21 deliveries Dilaudid PCA 0.4 mg = 8.4 mg/24 hrs    Physical Examination:   BP: (90-120)/(50-58)   Temp:  [36.1 C (97 F)-37.8 C (100 F)]   Temp src: Temporal (05/21 1152)  Heart Rate:  [92-108]   Resp:  [16]   SpO2:  [96 %-100 %]   General appearance: Appears fatigued but upbeat today and smiling.  Head: MMM, sclera anicteric  Lungs: wearing 2 liters n/c, decreased BS left base and fine crackles right base.  Heart: S1, S2 RRR  Abdomen: Sl firm, hypoactive BS, NT, G tube site  benign.  Extremities: Still w dependant BLE edema although is decreased from last week.  Neurologic: Alert and oriented, smiling and more interactive.     Assessment/Plan:  Met with Deatrice and her daughter. Noted improved pain control over last week. Reminded of the plan to avoid PCA and encourage po prn dilaudid use.     Pain/Dyspnea/Excess Secretions  - tylenol 650 mg po q 6 hrs prn, bengay/massage to shoulders, lidoderm patch  - continue dilaudid 8 mg po q 2 hrs prn first line and dilaudid 0.4 IV q 15 min prn breakthrough and only if po not effective,   - continue methadone 7.07/12/08    Anxiety/Agitation  cymbalta 20 mg po qd    Dysmotility/Constipation, last BM 5/21  - senna 2 tabs bid, miralax 17 gm every day (hold for loose stool)  - consider weaning reglan to 5 mg po qid.   - encourage increase ambulation    [name] ANP  Palliative Care Service  [facility] #[id number]  07/23/2017 4:57 PM

## 2017-07-23 NOTE — Progress Notes (Signed)
Surgical Oncology Surgery Progress Note    Patient: Amanda Galloway    LOS: 116 days    Attending: Galka     INTERVAL HISTORY & SUBJECTIVE  NAE overnight. Reports pain controlled.   Tolerating TFs, not at goal yet.  Na 125 from 125.  Drain removed at bedside yesterday. Remains off hep gtt.       OBJECTIVE  Physical Exam:  Temp:  [36.1 °C (97 °F)-37.8 °C (100 °F)] 36.4 °C (97.5 °F)  Heart Rate:  [92-108] 92  Resp:  [16] 16  BP: (90-120)/(46-58) 90/52   GEN: no apparent distress  HEENT: NCAT, face symmetric  CHEST: nonlabored respirations on 2LNC  ABD: soft, distended, mildly tender surrounding G-tube that is CDI, right-sided perc drain with minimal bilious drainage  NEURO/MOTOR: alert, appropriate  EXTREMITIES: atraumatic  VASCULAR: feet wwp, 1+ peripheral edema in BLE    Intake/Output:  05/20 0700 - 05/21 0659  In: 1396.3 [P.O.:340; I.V.:11]  Out: 2250 [Urine:2250]     Medications:  Current Facility-Administered Medications   Medication   • sodium chloride tablet 2 g   • enoxaparin (LOVENOX) injection 40 mg   • linezolid (ZYVOX) tablet 600 mg   • pregabalin (LYRICA) capsule 75 mg   • HYDROmorphone (DILAUDID) tablet 8 mg   • HYDROmorphone (DILAUDID) PCA 1 mg/mL   • methadone (DOLOPHINE) tablet 7.5 mg    And   • methadone (DOLOPHINE) tablet 10 mg   • ondansetron (ZOFRAN) injection 4 mg   • sennosides (SENOKOT) 8.8 MG/5ML syrup 17.6 mg   • PROSOURCE NO CARB liquid LIQD 30 mL   • metoclopramide (REGLAN) injection 10 mg   • albuterol (PROVENTIL) nebulization 2.5 mg   • polyethylene glycol (GLYCOLAX,MIRALAX) powder 17 g   • acetaminophen (TYLENOL) 650 MG/20.3 ML solution 650 mg   • lactobacillus rhamnosus (GG) (CULTURELLE) capsule 1 each   • methyl salicylate-menthol (BENGAY) topical cream   • bisacodyl (DULCOLAX) suppository 10 mg   • levothyroxine (SYNTHROID, LEVOTHROID) tablet 137 mcg   • DULoxetine (CYMBALTA) DR capsule 20 mg   • caspofungin (CANCIDAS) 50 mg in sodium chloride 0.9% 100 mL mini-bag   • naloxone  (NARCAN) 0.4 mg/mL injection 0.1 mg   • mineral oil-hydrophilic petrolatum (AQUAPHOR) ointment   • sodium chloride 0.9 % flush 10 mL   • sodium chloride 0.9 % flush 10 mL   • sodium chloride 0.9 % FLUSH REQUIRED IF PATIENT HAS IV   • dextrose 5 % FLUSH REQUIRED IF PATIENT HAS IV   • lidocaine (LIDODERM) 5 % patch 1 patch   • lidocaine (LIDODERM) 5 % patch 1 patch     Facility-Administered Medications Ordered in Other Encounters   Medication   • etomidate (AMIDATE) 2 mg/mL injection   • rocuronium (ZEMURON) 10 mg/mL injection         Laboratory values:   Recent Labs      07/23/17   0025  07/22/17   0905  07/22/17   0029   WBC  9.5  8.1   --    Hemoglobin  6.6*  6.8*   --    Hematocrit  21*  22*   --    Platelets  323  342   --    INR   --    --   1.1     No components found with this basename: APTT, PT Recent Labs      07/23/17   0025  07/22/17     0905   Sodium  125*  125*   Potassium  4.3  4.1   Chloride  85*  87*   CO2  28  25   UN  7  5*   Creatinine  0.82  0.66   Glucose  130*  175*   Calcium  8.6  8.4*   Magnesium  1.9  1.3*   Phosphorus   --   4.3    Recent Labs      07/22/17   0905   AST  152*   ALT  115*   Alk Phos  764*   Bilirubin,Total  0.5     Recent Labs      07/22/17   0905  07/22/17   0029   Total Protein  6.2*   --    Albumin  2.7*   --    Prealbumin   --   13*     No results for input(s): AMY, LIP in the last 72 hours.   GLUCOSE:   No results for input(s): PGLU in the last 72 hours.  Imaging: No results found.     ASSESSMENT  Breely Lager is a 72 y.o. female with h/o recent PE and pancreatic cancer POD # 102  status post whipple procedure complicated by delayed gastric emptying.  NGT replaced on 04/04/17. UGI on 04/12/17 concerning for obstruction just beyond the GJ in the efferent limb.  Course complicated on 04/16/17 by rising LFTs and sepsis with CT concerning for cholangitis given biliary tree obstruction and PV compression due to hematoma and afferent limb distention in setting of PE treatment. Is  s/p IR PTC on 04/16/17 and IR percutaneous aspiration of anterior abdominal fluid collection on 04/24/17. IR LUQ perc drain placement 3/1. IR anterior abdominal 8fr perc drain placement 3/3. IR anterior perc drain into left hepatic collection 3/6.     PLAN  Regular diet and TFs as tolerated, holding for nausea; will increase TFs to 70cc/hr, daughter and patient hesitant will reiterate importance of nutrition and increasing slowly   Vent G-tube PRN nausea  Monitor mild hyponatremia 125 (125), labs MWF   Bowel regimen  · Continue diuresis prn, small dose lasix this AM   · Analgesia with PCA, Methadone, appreciate palliative care assistance  · Continue Antibiotics per ID, Caspofungin and linezolid, appreciate recs   · Heparin gtt stopped 5/20. Remains on ppx lovenox for now  · OOB/ambulate, PT/OT  · Dispo: SNF pending clinical improvment     Author:  , MD as of: 07/23/2017  at: 9:20 AM

## 2017-07-23 NOTE — Progress Notes (Signed)
Lauralie Blacksher is currently functioning below her baseline and will likely require SNF rehab before returning to she prior living arrangement. she will likely return to her prior level of functioning with a short term rehab stay. However, if discharge directly to home is desired, Charisa Twitty will need 24 hour assistance with all mobility and require Home PT     PT Discharge Equipment Recommended: To be determined if returning to their prior living environment today.    Physical Therapy Treatment Note:     07/23/17 1030   PT Tracking   PT TRACKING PT Assigned   Visit Number   Visit Number [facility]) / Treatment Day [facility]) 4   Visit Details [facility])   Visit Type [facility]) Follow Up   Reason for visit [facility]) General   Precautions/Observations   Precautions used Yes   LDA Observation IV lines;PCA;O2 (comment)  (2LO2)   Fall Precautions General falls precautions   Pain Assessment   *Is the patient currently in pain? Yes   Pain (Before,During, After) Therapy During   0-10 Scale 9   Pain Location Shoulder   Pain Orientation Left   Pain Intervention(s) Repositioned;Refer to nursing for pain management   Additional comments Pt denied abdomen pain   Cognition   Arousal/Alertness Appropriate responses to stimuli   Orientation Oriented to person;Oriented to place  (oriented to month and year)   Following Commands Follows simple commands without difficulty   Bed Mobility   Bed mobility Not tested   Additional comments Pt up in recliner at start and end of session   Transfers   Transfers Tested   Sit to Stand Minimum ;1 person assist;Verbal cues   Stand to sit Contact guard;1 person assist;Verbal cues   Transfer Assistive Device rolling walker   Mobility   Mobility Tested   Gait Pattern Decreased cadence;Trunk flexed;Decreased R step length;Decreased R step height;Decreased L step length;Decreased L step height  (lack bialteral heel strike)   Ambulation Assist Contact guard;1 person assist   Ambulation Distance (Feet) Pt able to walk ~62ft  prior to mild LOB required CGA at trunk to assist with stability. Pt able to complete total of ~451ft   Ambulation Assistive Device rolling walker   [place] person assist   Stair Management Technique Two rails;Step to pattern;Forwards   Number of Stairs 8   Additional comments With change of direction, pt requried CGA at trunk for stability, assisting with appropriate weight shift, and preventing LOB. During straight distances, pt able to complete with SBA from writer for VCs for improved posture. Without use of RW, pt noted wiht moderate unsteadiness requried constant need of hands on for safety and balance. Pt able to complete stairs with minAx1 for controlled descent d/t poor eccentric control.   Balance   Balance Tested   Sitting - Static Independent ;Unsupported   Standing - Static Contact guard;Unsupported   Standing - Dynamic Min assist;Unsupported   PT AM-PAC Mobility   Turning over in bed? 1   Sitting down on and standing up from a chair with arms? 1   Moving from lying on back to sitting on the side of the bed? 1   Moving to and from a bed to a chair? 3   Need to walk in hospital room? 3   Climbing 3 - 5 steps with a railing? 3   Total Raw Score 12   Standardized Score 35.33   CMS 1-100% Score 69   Assessment   Brief Assessment Remains appropriate  for skilled therapy   Problem List Impaired LE strength;Impaired endurance;Impaired balance;Impaired functional mobility   Patient / Family Goal rehab   Additional Comments Pt has made good improvements towards goals, able to tolerate further distances, stairs, and higher level balance with removal of UE from RW. Pt however is still functioning below baseline and requried 1A for all mobility. and would continue to benefit from skilled PT to progress all mobiliity.   Plan/Recommendation   Treatment Interventions [facility] PT   PT Frequency 2-4x/wk   Mobility Recommendations 1A with RW, please assist wiht ambulation out of room 3x/day    Discharge Recommendations [place]   PT Discharge Equipment Recommended To be determined   Assessment/Recommendations Reviewed With: Patient;Nursing   Next PT Visit progress all mobility; higher level balance with ambulation, dual task   Time Calculation   PT Timed Codes 28   PT Untimed Codes 0   PT Unbilled Time 0   PT Total Treatment 28   Plan and Onset date   Plan of Care Date 07/23/17   Onset Date 03/29/17   Treatment Start Date 07/08/17     Rodena Piety, PT, DPT  Pager [phone]

## 2017-07-23 NOTE — Progress Notes (Signed)
Massage Therapy Note / Inpatient       Today's Date: 07/23/2017    Location:  [facility] [place] 5    Diagnosis: Pancreatic Adenocarcinoma    Oncology related treatment:   03/29/17-Whipple on 1/25 c/b, mult IR procedures for drainage of abscesses, VRE bacteremia w/ mediport removal 4/8, s/p R pleural pigtail; s/p PICC placement; s/p IR PEG on 4/12 c/b posterior gastric perforation, s/p IR perc drain x2 &4/19 CT revealed oral contrast passing into lesser sac/retroperitoneum. ID & Palliative (for pain) following  5/2: Increased O2 requirements and tachycardia (concerned for PE) on [facility] [place]. Patient febrile to 39.6 with rising WBC. Admitted to SICU 814 with concern for sepsis. Heparin gtt started for suspicion of PE. Paused 0200 on 5/3 for new bleeding from L perc and drop in HCT 22 (28).  5/3: 2nd R perc drain placed in IR in intra-hepatic abscess (infected biloma)  07/2017- Perc drain removed      Lab results: 07/23/17  1030   WBC 9.4         Lab results: 07/23/17  1030   Platelets 325         Lab results: 07/23/17  1030   Hemoglobin 6.9*         Lab results: 07/23/17  1030   Hematocrit 22*       Mindful of     [x] Lymphedema risk   [] DVT     [] PE   [x] Blood thinners              [] Bone metastases   [x] Medical devices, Left PICC, peg tube   [x] other, pain medication  and strokes adjusted appropriately.     Patients past history, current pertinent medical history, lab results, massage procedure, benefits and limitations were discussed with the patient as needed.     Subjective / chief complaint: Patient reported feeling well. Patient requested a neck, shoulder and right upper arm massage.    Objective: Patient was pleasant. Patient was talkative.    Treatment: Provided 10 minutes of massage therapy, using slow tempo and a pressure of 1 on the MacDonald Scale to all areas unless otherwise noted.     Areas addressed: [] head [] face [x] neck [x] shoulders [x] arms [] hands [] upper legs [] lower legs [] feet  [] back [] neck [] upper legs [] lower  legs [] feet   Right upper arm only    Positioning: []  prone []  supine []  right side-lying []  left side-lying   Seated upright in chair    Positioning Support: []  upper body incline [x]  head support []  knee support []  ankle support []  breast bolster   Pillows behind head  Legs elevated in recliner    Sensitivity To Lotions: No     Patient response to treatment: Patient fell asleep. Patient thanked Probation officer.    Plan for future treatment / recommendations: Continue comfort oriented massage addressing specific symptoms as indicated.    Brooke Bonito, 07/23/2017

## 2017-07-23 NOTE — Plan of Care (Signed)
Impaired Ambulation     STG - IMPROVE AMBULATION Completed or Resolved        Impaired Bed Mobility     STG - IMPROVE BED MOBILITY Completed or Resolved        Impaired Transfers     STG - IMPROVE TRANSFERS Completed or Resolved          Impaired Ambulation     STG - IMPROVE AMBULATION Progressing towards therapy goal; extend goal x 5 more visits        Impaired Bed Mobility     STG - IMPROVE BED MOBILITY Progressing towards therapy goal; extend goal x 5 more visits     STG - IMPROVE BED MOBILITY Progressing towards therapy goal; extend goal x 5 more visits        Impaired Stair Navigation     STG - IMPROVE STAIR NAVIGATION Progressing towards therapy goal; extend goal x 5 more visits        Impaired Transfers     STG - IMPROVE TRANSFERS Progressing towards therapy goal; extend goal x 5 more visits     STG - IMPROVE TRANSFERS Progressing towards therapy goal; extend goal x 5 more visits

## 2017-07-24 LAB — CBC AND DIFFERENTIAL
Baso # K/uL: 0 10*3/uL (ref 0.0–0.1)
Basophil %: 0.6 %
Eos # K/uL: 0.5 10*3/uL — ABNORMAL HIGH (ref 0.0–0.4)
Eosinophil %: 7.3 %
Hematocrit: 20 % — ABNORMAL LOW (ref 34–45)
Hemoglobin: 6.2 g/dL — ABNORMAL LOW (ref 11.2–15.7)
IMM Granulocytes #: 0 10*3/uL
IMM Granulocytes: 0.3 %
Lymph # K/uL: 0.7 10*3/uL — ABNORMAL LOW (ref 1.2–3.7)
Lymphocyte %: 9.8 %
MCH: 29 pg/cell (ref 26–32)
MCHC: 31 g/dL — ABNORMAL LOW (ref 32–36)
MCV: 93 fL (ref 79–95)
Mono # K/uL: 1.2 10*3/uL — ABNORMAL HIGH (ref 0.2–0.9)
Monocyte %: 17.8 %
Neut # K/uL: 4.3 10*3/uL (ref 1.6–6.1)
Nucl RBC # K/uL: 0 10*3/uL (ref 0.0–0.0)
Nucl RBC %: 0 /100 WBC (ref 0.0–0.2)
Platelets: 255 10*3/uL (ref 160–370)
RBC: 2.2 MIL/uL — ABNORMAL LOW (ref 3.9–5.2)
RDW: 19.1 % — ABNORMAL HIGH (ref 11.7–14.4)
Seg Neut %: 64.2 %
WBC: 6.8 10*3/uL (ref 4.0–10.0)

## 2017-07-24 LAB — COMPREHENSIVE METABOLIC PANEL
ALT: 135 U/L — ABNORMAL HIGH (ref 0–35)
AST: 152 U/L — ABNORMAL HIGH (ref 0–35)
Albumin: 2.9 g/dL — ABNORMAL LOW (ref 3.5–5.2)
Alk Phos: 707 U/L — ABNORMAL HIGH (ref 35–105)
Anion Gap: 10 (ref 7–16)
Bilirubin,Total: 0.3 mg/dL (ref 0.0–1.2)
CO2: 31 mmol/L — ABNORMAL HIGH (ref 20–28)
Calcium: 8.6 mg/dL (ref 8.6–10.2)
Chloride: 91 mmol/L — ABNORMAL LOW (ref 96–108)
Creatinine: 0.72 mg/dL (ref 0.51–0.95)
GFR,Black: 97 *
GFR,Caucasian: 85 *
Glucose: 133 mg/dL — ABNORMAL HIGH (ref 60–99)
Lab: 11 mg/dL (ref 6–20)
Potassium: 4 mmol/L (ref 3.3–5.1)
Sodium: 132 mmol/L — ABNORMAL LOW (ref 133–145)
Total Protein: 6.4 g/dL (ref 6.3–7.7)

## 2017-07-24 LAB — HOLD SST

## 2017-07-24 LAB — CORTISOL: Cortisol,Random: 9.1 ug/dL

## 2017-07-24 LAB — T4, FREE: Free T4: 0.8 ng/dL — ABNORMAL LOW (ref 0.9–1.7)

## 2017-07-24 LAB — TYPE AND SCREEN
ABO RH Blood Type: A NEG
Antibody Screen: NEGATIVE

## 2017-07-24 LAB — MAGNESIUM: Magnesium: 1.7 mg/dL (ref 1.6–2.5)

## 2017-07-24 LAB — PHOSPHORUS: Phosphorus: 4.7 mg/dL — ABNORMAL HIGH (ref 2.7–4.5)

## 2017-07-24 MED ORDER — LEVOTHYROXINE SODIUM 150 MCG PO TABS *I*
150.0000 ug | ORAL_TABLET | Freq: Every day | ORAL | Status: DC
Start: 2017-07-25 — End: 2017-08-24
  Administered 2017-07-25 – 2017-08-24 (×30): 150 ug via ORAL
  Filled 2017-07-24 (×32): qty 1

## 2017-07-24 MED ORDER — SODIUM CHLORIDE 1 GM PO TABS *I*
1.0000 g | ORAL_TABLET | Freq: Three times a day (TID) | ORAL | Status: DC
Start: 2017-07-24 — End: 2017-07-26
  Administered 2017-07-24 – 2017-07-25 (×6): 1 g via GASTROSTOMY
  Filled 2017-07-24 (×8): qty 1

## 2017-07-24 MED ORDER — HYDROMORPHONE HCL 4 MG PO TABS *I*
8.0000 mg | ORAL_TABLET | ORAL | Status: DC | PRN
Start: 2017-07-24 — End: 2017-07-25
  Administered 2017-07-24 – 2017-07-25 (×8): 8 mg via ORAL
  Filled 2017-07-24 (×8): qty 2

## 2017-07-24 MED ORDER — LEVOTHYROXINE SODIUM 25 MCG PO TABS *I*
12.5000 ug | ORAL_TABLET | Freq: Once | ORAL | Status: AC
Start: 2017-07-24 — End: 2017-07-24
  Administered 2017-07-24: 12.5 ug via ORAL
  Filled 2017-07-24: qty 1

## 2017-07-24 MED ORDER — SODIUM CHLORIDE 0.9 % IV SOLN WRAPPED *I*
50.0000 mL/h | Status: DC
Start: 2017-07-24 — End: 2017-08-01
  Administered 2017-07-28: 3 mL/h via INTRAVENOUS
  Administered 2017-07-31 – 2017-08-01 (×5): 50 mL/h via INTRAVENOUS

## 2017-07-24 NOTE — Progress Notes (Signed)
Pt received 1 unit of blood this morning for a HCT of 20. Tolerated well without complications. Per Mee Hives, NP, the re-check will happen tonight with labs.

## 2017-07-24 NOTE — Plan of Care (Signed)
Endocrine Plan of Care   Amanda Galloway is a [name] w/ pancreatic CA s/p Whipple 1/25 (treatment for cure) w/ prolonged complicated hospitalization issues w/ infection, nutrition/appetite/nausea/constipation, s/p PEG w/ perf now resolved, most recent CT abd 5/16 showed no new fluid collections and decrease in existing hepatic collections. Endocrine was call due to concern of her most recent TFT. She has been hospitalized since January and as per Team has been receiving her home dose of Levothyroxine 137 mcg daily early morning with no other medications for about 1-2 hours to ensure appropriate administration.     Patient TFT are as follow's:     Component      Latest Ref Rng & Units 07/24/2017 07/23/2017 12/14/2016 09/30/2016   TSH      0.27 - 4.20 uIU/mL  20.58 (H) 2.87 5.53 (H)   Free T4      0.9 - 1.7 ng/dL 0.8 (L)        .  Patient is s/p Whipple's and may have some absorption  malfunction would increase Levothyroxine to 150 mcg daily and would assure that no other medications, feeds, food is to be given with Levothyroxine for a least a 2 hour period. Would also make sure it is only given with water to allow appropriate administration and absorption.     Her current Biochemical evaluation does not explain her current symptoms as her levels would be Markley deranged. She has multiple factors that is contributing to her symptoms. This is mild biochemical changes and this can also be due to a co-existence of  non thyroidal illness.     Would recommend to check a TSH, Free T4 and a total T3 in 4 weeks after the start of Levothyroxine dose increase to 150 mcg daily.     Please call endocrine fellow if there is any concern or questions     Dina Rich   Endocrine Fellow

## 2017-07-24 NOTE — Progress Notes (Signed)
Surgical Oncology Surgery Progress Note    Patient: Amanda Galloway    LOS: 117 days    Attending: Ron Agee     INTERVAL HISTORY & SUBJECTIVE  NAE overnight.AVSS   Pain continues to be controlled   Tolerating TFs, 75cc/hr, not yet at goal   Labs not yet drawn, TSH drawn yesterday in work-up for hyponatremia, elevated at 20.58   Cortisol 9.1      OBJECTIVE  Physical Exam:  Temp:  [35.9 C (96.6 F)-36.6 C (97.9 F)] 36.4 C (97.5 F)  Heart Rate:  [84-98] 98  Resp:  [16] 16  BP: (90-120)/(50-62) 120/60   GEN: no apparent distress  HEENT: NCAT, face symmetric  CHEST: nonlabored respirations on 2LNC  ABD: soft, distended, mildly tender surrounding G-tube that is CDI, right perc drain since removed   NEURO/MOTOR: alert, appropriate  EXTREMITIES: atraumatic  VASCULAR: feet wwp, 1+ peripheral edema in BLE    Intake/Output:  05/21 0700 - 05/22 0659  In: 1329.2 [P.O.:480; I.V.:9.2]  Out: 1150 [Urine:1150]     Medications:  Current Facility-Administered Medications   Medication    sodium chloride tablet 2 g    enoxaparin (LOVENOX) injection 40 mg    linezolid (ZYVOX) tablet 600 mg    pregabalin (LYRICA) capsule 75 mg    HYDROmorphone (DILAUDID) tablet 8 mg    HYDROmorphone (DILAUDID) PCA 1 mg/mL    methadone (DOLOPHINE) tablet 7.5 mg    And    methadone (DOLOPHINE) tablet 10 mg    ondansetron (ZOFRAN) injection 4 mg    sennosides (SENOKOT) 8.8 MG/5ML syrup 17.6 mg    PROSOURCE NO CARB liquid LIQD 30 mL    metoclopramide (REGLAN) injection 10 mg    albuterol (PROVENTIL) nebulization 2.5 mg    polyethylene glycol (GLYCOLAX,MIRALAX) powder 17 g    acetaminophen (TYLENOL) 650 MG/20.3 ML solution 650 mg    lactobacillus rhamnosus (GG) (CULTURELLE) capsule 1 each    methyl salicylate-menthol (BENGAY) topical cream    bisacodyl (DULCOLAX) suppository 10 mg    levothyroxine (SYNTHROID, LEVOTHROID) tablet 137 mcg    DULoxetine (CYMBALTA) DR capsule 20 mg    caspofungin (CANCIDAS) 50 mg in sodium chloride 0.9% 100  mL mini-bag    naloxone (NARCAN) 0.4 mg/mL injection 0.1 mg    mineral oil-hydrophilic petrolatum (AQUAPHOR) ointment    sodium chloride 0.9 % flush 10 mL    sodium chloride 0.9 % flush 10 mL    sodium chloride 0.9 % FLUSH REQUIRED IF PATIENT HAS IV    dextrose 5 % FLUSH REQUIRED IF PATIENT HAS IV    lidocaine (LIDODERM) 5 % patch 1 patch    lidocaine (LIDODERM) 5 % patch 1 patch     Facility-Administered Medications Ordered in Other Encounters   Medication    etomidate (AMIDATE) 2 mg/mL injection    rocuronium (ZEMURON) 10 mg/mL injection         Laboratory values:   Recent Labs      07/23/17   1030  07/23/17   0025   07/22/17   0029   WBC  9.4  9.5   < >   --    Hemoglobin  6.9*  6.6*   < >   --    Hematocrit  22*  21*   < >   --    Platelets  325  323   < >   --    INR   --    --    --  1.1    < > = values in this interval not displayed.     No components found with this basename: APTT, PT Recent Labs      07/23/17   1605  07/23/17   0025  07/22/17   0905   Sodium  130*  125*  125*   Potassium  4.4  4.3  4.1   Chloride  89*  85*  87*   CO2  '27  28  25   ' UN  10  7  5*   Creatinine  0.80  0.82  0.66   Glucose  108*  130*  175*   Calcium  8.9  8.6  8.4*   Magnesium   --   1.9  1.3*   Phosphorus   --    --   4.3    Recent Labs      07/22/17   0905   AST  152*   ALT  115*   Alk Phos  764*   Bilirubin,Total  0.5     Recent Labs      07/22/17   0905  07/22/17   0029   Total Protein  6.2*   --    Albumin  2.7*   --    Prealbumin   --   13*     No results for input(s): AMY, LIP in the last 72 hours.   GLUCOSE:   No results for input(s): PGLU in the last 72 hours.  Imaging: No results found.     ASSESSMENT  Amanda Galloway is a 72 y.o. female with h/o recent PE and pancreatic cancer POD # 102  status post whipple procedure complicated by delayed gastric emptying.  NGT replaced on 04/04/17. UGI on 04/12/17 concerning for obstruction just beyond the [place] in the efferent limb.  Course complicated on 9/38/18 by rising LFTs  and sepsis with CT concerning for cholangitis given biliary tree obstruction and PV compression due to hematoma and afferent limb distention in setting of PE treatment. Is s/p IR PTC on 04/16/17 and IR percutaneous aspiration of anterior abdominal fluid collection on 04/24/17. IR LUQ perc drain placement 3/1. IR anterior abdominal 59f perc drain placement 3/3. IR anterior perc drain into left hepatic collection 3/6.     PLAN  Endocrine consult for synthroid recs, TSH 20.58, possible source of hyponatremia   Regular diet and TFs as tolerated, holding for nausea; will increase TFs to 80cc/hr, daughter and patient hesitant will reiterate importance of nutrition and increasing slowly   Vent G-tube PRN nausea  Monitor mild hyponatremia (130), possible secondary to hypothyroid versus SIADH versus adrenal insufficiency. Cortisol 9.1, TSH 20.58; will pursue treatment of hyponatremia  labs MWF   Bowel regimen   Analgesia with PCA, Methadone, appreciate palliative care assistance   Continue Antibiotics per ID, Caspofungin and linezolid, appreciate recs    Heparin gtt stopped 5/20. Remains on ppx lovenox for now   OOB/ambulate, PT/OT   Dispo: SNF pending clinical improvment     Author: ATonia Ghent MD as of: 07/24/2017  at: 6:40 AM

## 2017-07-24 NOTE — Progress Notes (Signed)
Palliative Care Progress Note    HPI: [name] w/ pancreatic CA s/p Whipple 1/25 (treatment for cure) w/ prolonged complicated hospitalization issues w/ infection, nutrition/appetite/nausea/constipation, s/p PEG w/ perf now resolved, most recent CT abd 5/16 showed no new fluid collections and decrease in existing hepatic collections. Pt resumed methadone on 5/8 and has been slowing weaning off her dilaudid PCA. All drains now removed, receiving TFs at night to optimize appetite during day.    Subjective: "Yesterday was good, I was feeling better and eating more but today I feel miserable. My shoulders and abdomen are killing me. I took some dilaudid (po an hour prior) but my pain is still 7/10, I'm not ready to go down on the PCA dose yet"    Objective: chart reviewed, pt case discussed w/ palliative phmd for thoroughness; pt had good day yesterday w/ minimal pain & went for 4 walks, eating better but experiencing more pain today, only took her oral dilaudid x 1 today - reviewed that she should take oral as 1st line and have PCA as her backup, reviewed dose on PCA is subpar to her oral dose; pt not wanting any changes on her PCA dose today though receptive to anticipatory guidance for tomorrow if doing better then likely would advise to decrease dose to 0.25 mg & ideally have it RN delivered as to better ensure she was offered her oral dilaudid 1st    Palliative Care ROS:  Pain   Moderate  Nausea   Mild  Anorexia   Moderate  Anxiety   Moderate  Depression   Severe  Shortness of Breath   Mild  Tiredness/Fatigue   Mild  Drowsiness/Sleepiness   Mild  Constipation   no  Unable to Respond   no  Delirium   no   Last stool 5/22    Physical Examination:   BP: (92-120)/(54-62)   Temp:  [36.1 C (97 F)-36.8 C (98.2 F)]   Temp src: Temporal (05/22 2002)  Heart Rate:  [84-98]   Resp:  [16-18]   SpO2:  [91 %-98 %]   Gen appearance: older Caucasian female, sitting up in recliner, reports RUQ and LLQ abd pain moderate this  afternoon w/ no relief an hour after taking her PO dilaudid (first time took today); pt still eating some (half portion of cheesecake) and no longer c/o pain at PEG site, c/o bilat shoulder pain r/t fibromyalgia, declined offer to apply bengay cream  Lungs: easy resp, RA  Heart: reg  Abd: large, soft, +BS, PEG (tolerating TFs better w/ less bloating, better appetite)  Extrem: edematous LEs  Skin:  dry and red LEs, flaky skin  Neuro: A+O x 3, ambulates w/ walker - needs encouragement to get OOB and up walking w/ assist though reportedly went for 4 walks yesterday!    Assessment/Plan:  [name] w/ pancreatic CA s/p Whipple (treatment for cure) w/ prolonged hospitalization & most recent abd CT revealing decreasing fluid collections w/ no new collections. Pt struggling this afternoon w/ abd pain today but overall her pain is improved and her dilaudid use is down 50%. Pt agreeable to try to ask for dilaudid PO prn dose first as anticipate she will be ready to come off PCA soon.     Pain/dyspnea  Dilaudid PCA  0.4 mg IV Q 15 min prn (received total 9.2 mg from 5/21 630 am to 5/22 6 am, equiv to 184 mg po ME or 46 mg po dilaudid) -her IV use is  down ~ 50% from 2 days ago, would leave dose same for now, anticipate if doing better on oral dilaudid may be ready to have RN delivered prn tomorrow coming off PCA  Dilaudid 8 mg po Q 2hrs prn (x 1 on 5/21, x 1 on 5/22)   Lyrica 75 mg po daily for fibromyalgia -if tolerates, then plan increase to BID on 5/27  Methadone 7.07/12/08 mg po (total 27.5 mg/d) restarted 5/7, last increase 5/14 (ECG on 5/2 w/ QTc 392)  Albuterol neb Q 6 hrs prn (none used)  Lidocaine patch daily x 2 (abd, LUE)  Acetaminophen 650 mg po Q 6 hrs prn fever     Anxiety/agitation/nausea/fatigue  Cymbalta 20 mg po/d  Metoclopramide 10 mg IV Q 6 hrs ATC  Ondansetron 4 mg IV Q 6 hrs ATC (started 5/16)  Ativan 0.25 mg IV Q 6 hrs prn (x 1 on 5/16)  Encouraged complementary adjuvant therapies for coping, meditation  app, aromatherapy, gentle massage     Prevention of opioid induced constipation, last stool 5/22  Bisacodyl supp pr daily   Miralax 17 gm po daily  Senna 17.6 mg po BID    Dry flaky edematous Skin  Aquaphor prn   Eucerin prn for BLEs    GOC/prognosis  Full code  Dtr Rod Mae is HCP (very supportive and informed, former [facility] RN)  Goal currently is to prioritize pt's symptom control, long course though pt endorses agreement w/ continuing current treatment plan, dispo to SNF in hopes of eventually making recovery back to independent living    Pt case discussed w/ RN.  We will continue to follow along. Please call if questions/concerns.    Total Time Spent 45 minutes:             >50% of time was spent in counseling and/or coordination of care.     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    Anemia D64.9    Hypothyroidism E03.9    Pancreatic cancer C25.9    Anemia D64.9     hx of Pulmonary embolism I26.99    Acute pulmonary insufficiency J98.4    Depression F32.9    Sepsis A41.9       No Known Allergies (drug, envir, food or latex)    Scheduled Meds:    sodium chloride  1 g Per G Tube TID PC    [START ON 07/25/2017] levothyroxine  150 mcg Oral Daily @ 0600    enoxaparin  40 mg Subcutaneous Daily @ 2100    linezolid  600 mg Oral 2 times per day    pregabalin  75 mg Oral Daily    methadone  7.5 mg Oral Daily    And    methadone  10 mg Oral BID    ondansetron  4 mg Intravenous Q6H    sennosides  17.6 mg Oral 2 times per day    PROSOURCE NO CARB  30 mL Oral Daily with breakfast    metoclopramide IV  10 mg Intravenous Q6H    polyethylene glycol  17 g Oral Daily    lactobacillus rhamnosus (GG)  1 capsule Oral Daily    bisacodyl   10 mg Rectal Daily    DULoxetine  20 mg Oral  Q24H    lidocaine  1 patch Transdermal Q24H    lidocaine  1 patch Transdermal Q24H       Continuous Infusions:    sodium chloride      HYDROmorphone         PRN Meds:  HYDROmorphone, albuterol, acetaminophen, methyl salicylate-menthol, naloxone, mineral oil-hydrophilic petrolatum, sodium chloride, sodium chloride, sodium chloride, dextrose

## 2017-07-24 NOTE — Progress Notes (Signed)
07/24/17 0800   [facility] Patient Class Review   Patient Class Review Inpatient   Patient Class Effective 03/29/2017  Earnest Conroy RN   Utilization  Management  X [id number]  Page [phone]

## 2017-07-24 NOTE — Plan of Care (Signed)
Cognitive function     Cognitive function will be maintained Maintaining     Cognitive function will be maintained or return to baseline Maintaining        Fluid and Electrolyte Imbalance     Fluid and Electrolyte imbalance Maintaining        Post-Operative Complications     Prevent post-operative complications Maintaining        Post-Operative Hemodynamic Stability     Maintain Hemodynamic Stability Maintaining        Psychosocial     Demonstrates ability to cope with illness Maintaining        Safety     Patient will remain free of falls Maintaining          Bowel Elimination     Elimination patterns are normal or improving Progressing towards goal        GI Bleeding Elimination     Elimination of patterns are normal or improving Progressing towards goal        Mobility     Functional status is maintained or improved - Geriatric Progressing towards goal     Patient's functional status is maintained or improved Progressing towards goal        Nutrition     Patient's nutritional status is maintained or improved Progressing towards goal     Nutritional status is maintained or improved - Geriatric Progressing towards goal        Pain/Comfort     Patient's pain or discomfort is manageable Progressing towards goal     Patient's pain or discomfort is manageable Progressing towards goal        Post-Operative Bladder Elimination     Patient is able to empty bladder or return to baseline Progressing towards goal        Post-Operative Bowel Elimination     Elimination pattern is normal or improving Progressing towards goal

## 2017-07-24 NOTE — Consults (Signed)
Medical Nutrition Therapy - Follow Up    Admit Date: 03/29/2017    Patient Summary: 72 y.o. female with h/o recent PE and pancreatic cancer POD # 102status post whipple procedure complicated by delayed gastric emptying. NGT replaced on 04/04/17. UGI on 04/12/17 concerning for obstruction just beyond the [place] in the efferent limb. Course complicated on 5/64/33 by rising LFTs and sepsis with CT concerning for cholangitis given biliary tree obstruction and PV compression due to hematoma and afferent limb distention in setting of PE treatment. Is s/p IR PTC on 04/16/17 and IR percutaneous aspiration of anterior abdominal fluid collection on 04/24/17. IR LUQ perc drain placement 3/1. IR anterior abdominal 7f perc drain placement 3/3. IR anterior perc drain into left hepatic collection 3/6.      Pertinent Meds: caspofungin, bowel regimen including dulcolax, duloxetine, culturelle, Reglan, dilaudid, methadone, linezolid, Lyrica, NaCl tablets, levothyroxine, lasix    Pertinent Labs: Na 132 (L); Cl 91 (L); PO4 4.7 (H); BGs 133, 108    Reviewed I/O's: BM x2     Enteral or parenteral access: PICC, PEG     Food allergies: NKFA    Current diet: regular   TF order: Osmolite 1.5 @ 80 ml/hr x 12 hours (at night) with FWF 30 ml Q4H   Supplements: 30 ml prosource daily     Nutrition Focused Physical Exam:    Edema: +1 generalized, +1 abdominal, resolved flank and groin, trace BUE, +2 BLE   Abdomen: +BS, +flatus, loss of appetite   Skin: erythema/redness, abrasion to L elbow, skin tear to midline abdomen     Anthropometrics:  Height: 170.2 cm ('5\' 7"'$ )    Current Weight: 85.1 kg (187 lb 11.2 oz) (5/12); 118% IBW  Ideal Body Weight: 72.4 kg + 10%  BMI: 29.4 kg/(m^2) overweight with edema noted   Weight Hx: no new weight since previous assessment       Estimated Nutrient Needs: (Based on low weight of admission, 64.9 kg; close to being a dry weight)    1625-1950 kcal/day (25-30 kcal/kg)   85-117 g protein/day (1.3-1.8 g/kg)    1 liter fluid  restriction       Nutrition Assessment and Diagnosis:   TFs gradually increasing to 90 ml/hr, currently at 80 ml/hr x 12 hours which provides 960 ml formula, 1440 kcal, 60 g protein, 910 ml free water daily. Fluid restriction noted (po), sodium has been improving. Met with pt and daughter, daughter is concerned pt is taking almost nothing PO and is more concerned about how this is affecting her mom psychologicaly rather than nutritionally. She reports her mom was eating better when she was getting TFs at 25 ml/hr, however she understands that this is not meeting her needs. Can try TwoCal HN as it is lower volume and may help further with sodium status. Pt willing to try. She reports she is very bloated and still feels full hours after TFs have stopped.     Malnutrition Status:Pt diagnosed with moderate malnutrition 04/09/17.      Nutrition Intervention:   1. Recommend TwoCal HN @ 65 ml/hr x 12 hours with FWF 30 ml Q4H. Will provide 1560 kcal, 65 g protein and 726 ml free water daily.   - continue 30 ml prosource daily for an additional 60 kcal and 15 g protein   2. Continue regular diet as ordered.   3. Continue to monitor I/Os, weekly weight and enteral monitoring labs     Nutrition Monitoring/Evaluation:   1. Will monitor  diet tolerance and intake, TF tolerance, nutrition-related labs, weight trend, BM pattern.   2. Will follow up per high nutrition risk protocol.    Laurence Aly, [place]  Pager [phone]

## 2017-07-25 LAB — BASIC METABOLIC PANEL
Anion Gap: 12 (ref 7–16)
CO2: 28 mmol/L (ref 20–28)
Calcium: 9.3 mg/dL (ref 8.6–10.2)
Chloride: 91 mmol/L — ABNORMAL LOW (ref 96–108)
Creatinine: 0.68 mg/dL (ref 0.51–0.95)
GFR,Black: 102 *
GFR,Caucasian: 88 *
Glucose: 99 mg/dL (ref 60–99)
Lab: 12 mg/dL (ref 6–20)
Potassium: 4.6 mmol/L (ref 3.3–5.1)
Sodium: 131 mmol/L — ABNORMAL LOW (ref 133–145)

## 2017-07-25 LAB — RED BLOOD CELLS
Coded Blood type: 600
Component blood type: A NEG
Dispense status: TRANSFUSED

## 2017-07-25 LAB — HCT AND HGB
Hematocrit: 24 % — ABNORMAL LOW (ref 34–45)
Hemoglobin: 7.6 g/dL — ABNORMAL LOW (ref 11.2–15.7)

## 2017-07-25 LAB — MCHC: MCHC: 32 g/dL (ref 32–36)

## 2017-07-25 MED ORDER — HYDROMORPHONE HCL 2 MG/ML IJ SOLN *WRAPPED*
0.2500 mg | INTRAMUSCULAR | Status: AC | PRN
Start: 2017-07-25 — End: 2017-07-28
  Administered 2017-07-26 – 2017-07-27 (×4): 0.26 mg via INTRAVENOUS
  Filled 2017-07-25 (×4): qty 1

## 2017-07-25 MED ORDER — METOCLOPRAMIDE HCL 10 MG PO TABS *I*
10.0000 mg | ORAL_TABLET | Freq: Three times a day (TID) | ORAL | Status: DC
Start: 2017-07-25 — End: 2017-08-17
  Administered 2017-07-25 – 2017-08-16 (×64): 10 mg via ORAL
  Filled 2017-07-25 (×69): qty 1

## 2017-07-25 MED ORDER — HYDROMORPHONE HCL 2 MG/ML IJ SOLN *WRAPPED*
0.2500 mg | INTRAMUSCULAR | Status: DC | PRN
Start: 2017-07-25 — End: 2017-07-25

## 2017-07-25 MED ORDER — ONDANSETRON HCL 4 MG PO TABS *I*
4.0000 mg | ORAL_TABLET | Freq: Four times a day (QID) | ORAL | Status: DC
Start: 2017-07-25 — End: 2017-07-31
  Administered 2017-07-25 – 2017-07-31 (×22): 4 mg via ORAL
  Filled 2017-07-25 (×22): qty 1

## 2017-07-25 MED ORDER — HYDROMORPHONE HCL 4 MG PO TABS *I*
8.0000 mg | ORAL_TABLET | ORAL | Status: DC | PRN
Start: 2017-07-25 — End: 2017-08-01
  Administered 2017-07-25 – 2017-08-01 (×32): 8 mg via ORAL
  Filled 2017-07-25 (×34): qty 2

## 2017-07-25 NOTE — Progress Notes (Signed)
Surgical Oncology Surgery Progress Note    Patient: Amanda Galloway    LOS: 118 days    Attending: Ron Agee     INTERVAL HISTORY & SUBJECTIVE  NAE overnight.AVSS   Pain continues to be controlled   Tolerating TFs at goal   Synthroid dose increased yesterday      OBJECTIVE  Physical Exam:  Temp:  [36.1 C (97 F)-36.7 C (98.1 F)] 36.3 C (97.3 F)  Heart Rate:  [84-100] 95  Resp:  [16-18] 16  BP: (94-112)/(52-64) 112/64   GEN: no apparent distress  HEENT: NCAT, face symmetric  CHEST: nonlabored respirations on 2LNC  ABD: soft, distended, mildly tender surrounding G-tube that is CDI, right perc drain since removed   NEURO/MOTOR: alert, appropriate  EXTREMITIES: atraumatic  VASCULAR: feet wwp, 1+ peripheral edema in BLE    Intake/Output:  05/22 0700 - 05/23 0659  In: 2292.3 [P.O.:780; I.V.:107.6]  Out: 400 [Urine:400]     Medications:  Current Facility-Administered Medications   Medication    ondansetron (ZOFRAN) tablet 4 mg    sodium chloride 0.9% IV    sodium chloride tablet 1 g    levothyroxine (SYNTHROID, LEVOTHROID) tablet 150 mcg    HYDROmorphone (DILAUDID) tablet 8 mg    enoxaparin (LOVENOX) injection 40 mg    linezolid (ZYVOX) tablet 600 mg    pregabalin (LYRICA) capsule 75 mg    HYDROmorphone (DILAUDID) PCA 1 mg/mL    methadone (DOLOPHINE) tablet 7.5 mg    And    methadone (DOLOPHINE) tablet 10 mg    sennosides (SENOKOT) 8.8 MG/5ML syrup 17.6 mg    PROSOURCE NO CARB liquid LIQD 30 mL    metoclopramide (REGLAN) injection 10 mg    albuterol (PROVENTIL) nebulization 2.5 mg    polyethylene glycol (GLYCOLAX,MIRALAX) powder 17 g    acetaminophen (TYLENOL) 650 MG/20.3 ML solution 650 mg    lactobacillus rhamnosus (GG) (CULTURELLE) capsule 1 each    methyl salicylate-menthol (BENGAY) topical cream    bisacodyl (DULCOLAX) suppository 10 mg    DULoxetine (CYMBALTA) DR capsule 20 mg    naloxone (NARCAN) 0.4 mg/mL injection 0.1 mg    mineral oil-hydrophilic petrolatum (AQUAPHOR) ointment    sodium  chloride 0.9 % flush 10 mL    sodium chloride 0.9 % flush 10 mL    sodium chloride 0.9 % FLUSH REQUIRED IF PATIENT HAS IV    dextrose 5 % FLUSH REQUIRED IF PATIENT HAS IV    lidocaine (LIDODERM) 5 % patch 1 patch    lidocaine (LIDODERM) 5 % patch 1 patch     Facility-Administered Medications Ordered in Other Encounters   Medication    etomidate (AMIDATE) 2 mg/mL injection    rocuronium (ZEMURON) 10 mg/mL injection         Laboratory values:   Recent Labs      07/24/17   2349  07/24/17   0632  07/23/17   1030   WBC   --   6.8  9.4   Hemoglobin  7.6*  6.2*  6.9*   Hematocrit  24*  20*  22*   Platelets   --   255  325     No components found with this basename: APTT, PT Recent Labs      07/24/17   0632  07/23/17   1605  07/23/17   0025  07/22/17   0905   Sodium  132*  130*  125*  125*   Potassium  4.0  4.4  4.3  4.1  Chloride  91*  89*  85*  87*   CO2  31*  _0 UN  _1 5*   Creatinine  0.72  0.80  0.82  0.66   Glucose  133*  108*  130*  175*   Calcium  8.6  8.9  8.6  8.4*   Magnesium  1.7   --   1.9  1.3*   Phosphorus  4.7*   --    --   4.3    Recent Labs      07/24/17   0632  07/22/17   0905   AST  152*  152*   ALT  135*  115*   Alk Phos  707*  764*   Bilirubin,Total  0.3  0.5     Recent Labs      07/24/17   0632  07/22/17   0905   Total Protein  6.4  6.2*   Albumin  2.9*  2.7*     No results for input(s): AMY, LIP in the last 72 hours.   GLUCOSE:   No results for input(s): PGLU in the last 72 hours.  Imaging: No results found.     ASSESSMENT  Amanda Galloway is a 72 y.o. female with h/o recent PE and pancreatic cancer POD # 102  status post whipple procedure complicated by delayed gastric emptying.  NGT replaced on 04/04/17. UGI on 04/12/17 concerning for obstruction just beyond the [place] in the efferent limb.  Course complicated on 08/25/27 by rising LFTs and sepsis with CT concerning for cholangitis given biliary tree obstruction and PV compression due to hematoma and afferent limb distention in  setting of PE treatment. Is s/p IR PTC on 04/16/17 and IR percutaneous aspiration of anterior abdominal fluid collection on 04/24/17. IR LUQ perc drain placement 3/1. IR anterior abdominal 26f perc drain placement 3/3. IR anterior perc drain into left hepatic collection 3/6.     PLAN  Regular diet and tolerating TFs at goal    Vent G-tube PRN nausea  Monitor mild hyponatremia, labs MWF  Bowel regimen   Analgesia with PCA, Methadone, appreciate palliative care assistance   Continue Antibiotics per ID, Caspofungin and linezolid, appreciate recs    Heparin gtt stopped 5/20. Remains on ppx lovenox for now   OOB/ambulate, PT/OT   Dispo: SNF pending clinical improvment     Author: ATonia Ghent MD as of: 07/25/2017  at: 8:22 AM

## 2017-07-25 NOTE — Progress Notes (Signed)
Infectious Diseases (Team 3) Follow Up Note    Personally reviewed chart, labs, medications. Patient seen.     CC:  F/U VRE bacteremia, Liver and abdominal abscesses, s/p Enterobacter cloacae bacteremia, Intra-abdominal abscesses positive for VRE, enterobacter (last grew out 3/6), candida glabrata and candida albicans (both resistant to fluconazole)     Subjective:  States she feels "whipped", pain control is much better, denies N/V/D/F/C. Taking a few bites of food throughout the day.      ROS: Reviewed 6 systems in detail, see above    Current Meds:  Scheduled Meds:   ondansetron  4 mg Oral Q6H    sodium chloride  1 g Per G Tube TID PC    levothyroxine  150 mcg Oral Daily @ 0600    enoxaparin  40 mg Subcutaneous Daily @ 2100    linezolid  600 mg Oral 2 times per day    pregabalin  75 mg Oral Daily    methadone  7.5 mg Oral Daily    And    methadone  10 mg Oral BID    sennosides  17.6 mg Oral 2 times per day    PROSOURCE NO CARB  30 mL Oral Daily with breakfast    metoclopramide IV  10 mg Intravenous Q6H    polyethylene glycol  17 g Oral Daily    lactobacillus rhamnosus (GG)  1 capsule Oral Daily    bisacodyl  10 mg Rectal Daily    DULoxetine  20 mg Oral Q24H    lidocaine  1 patch Transdermal Q24H    lidocaine  1 patch Transdermal Q24H     Continuous Infusions:   sodium chloride      HYDROmorphone       PRN Meds:.   calcium carbonate  1,000 mg Oral BID PRN    HYDROmorphone PF  0.5 mg Intravenous Q1H PRN    Or    HYDROmorphone PF  1 mg Intravenous Q1H PRN    LORazepam  0.5 mg Oral Q6H PRN    heparin lock flush  50 Units Intracatheter PRN    ondansetron  4 mg Intravenous Q6H PRN     Objective:  BP: (94-132)/(52-70)   Temp:  [36.2 C (97.2 F)-36.7 C (98.1 F)]   Temp src: Temporal (05/23 1127)  Heart Rate:  [84-100]   Resp:  [16]   SpO2:  [82 %-97 %]     Physical Exam:              General Appearance: sitting up in chair, NAD.   HEENT: Sclera anicteric, MMM, OP clear  Pulm: crackles  bibasilar, WOB unlabored  CV: RRR, S1S2, no M/R/G, bilateral lower extremity edema  Abdomen:  Softly distended, TTP right side, PEG intact    Extremities:WWP  Skin:Warm and dry; B/L LEs 2+ edema,hyperpigmented color changes.   Neuro: alert, appropriate, oriented to place and person   Lines/Drains/Tubes: PICC LUE (4/11), PEG (4/12) - both sites benign.       Recent Labs  Lab 07/24/17  2349 07/24/17  0632 07/23/17  1030 07/23/17  0025   WBC  --  6.8 9.4 9.5   Hemoglobin 7.6* 6.2* 6.9* 6.6*   Hematocrit 24* 20* 22* 21*   Platelets  --  255 325 323   Seg Neut %  --  64.2 77.1 81.7   Lymphocyte %  --  9.8 6.7 5.7   Monocyte %  --  17.8 12.6 10.9   Eosinophil %  --  7.3 3.0 1.1       Recent Labs  Lab 07/24/17  1025 07/23/17  1605 07/23/17  0025   Sodium 132* 130* 125*   Potassium 4.0 4.4 4.3   CO2 31* 27 28   UN _0 Creatinine 0.72 0.80 0.82   Glucose 133* 108* 130*   Calcium 8.6 8.9 8.6         Lab results: 07/24/17  0632 07/22/17  0905 07/20/17  0010 07/19/17  0948 07/18/17  0104   Total Protein 6.4 6.2* 6.8 6.8 6.5   Albumin 2.9* 2.7* 2.9* 2.8* 2.6*   ALT 135* 115* 117* 144* 122*   AST 152* 152* 85* 135* 106*   Alk Phos 707* 764* 816* 901* 772*   Bilirubin,Total 0.3 0.5 0.2 0.3 <0.2       Lab Results  Component Value Date/Time   CRP 54 (H) 07/19/2017 0107   CRP 46 (H) 07/12/2017 0029   CRP 154 (H) 07/05/2017 0913       Lab Results  Component Value Date/Time   Sedimentation Rate 40 (H) 07/19/2017 0107   Sedimentation Rate >=130 (!) 07/12/2017 0029   Sedimentation Rate 73 (H) 07/05/2017 0913       Micro:   1/25: Intraoperative bile: GS 0 pmns, no organisms, Cx: no growth  2/12: 1 set of blood cultures from the right Mediport (no peripheral bld was sent): positive for Enterobacter cloacae complex in 70 hrs, sensitive to zosyn.   2/18 abdominal GS had >25 PMNs, many GPC in pairs/chains, many GNB and cultures 4+ Enterobacter cloacae complex. The cultures also grew 1+ Candida glabrata   2/21: BCx from the Right  peripheral IV, Mediport, L IJ:  No growth  05/03/17: IR-guided I&D with drain placement of the left upper quadrant collection: GS > 25 PMNs, many GNB and GN diplococci, Cultures with 4+ Enterobacter cloacae complex (resistant to Zosyn and CTX), C. Albicans and C. Glabrata, and Candida tropicalis  05/04/17:    1 set of blood cultures from left IJ: VRE at 15.3 TTP. Daptomycin sensitivities: MIC 1mg/ml (dose dependent),  Linezolid   1 set from periphery: VRE and Enterobacter cloacae at 14.8 TTP   05/05/17:  IR-guided I&D with drain placement of the intrahepatic collection: GS 1-10 PMNs, very few GPC in pairs. Fungal cultures with Candida glabrata  05/05/17: Blood cultures from periphery, Mediport and IJ: no growth  05/04/17: Urine cultures obtained (for unclear reason): no growth  05/08/17: Abscess GS: >25 PMNs, many GPC in chains, few GNB. Aerobic cultures growing 2+ Enterobacter (ertapenem, Cipro, TMP/SMX-sensitive) and 3+VRE, Fungal: C. Glabrata (sens to caspo, vori and dose dependent sens to fluconazole)  06/06/17: 2 sets of BCx (1 set each from periphery and Mediport) - taken after restarting ertapenem: IVAD grew VRE ttp 18.2 hours,  Periphery (Left arm) grew VRE ttp 17.3.  06/07/17 Pleural fluid (left) GS 1-10 PMN's, 1-10 Nucleated white cells, No organisms seen and 1604 nucleated cells. No growth on culture.   06/07/17 Abdominal abscess: labs cancelled - no specimens received.   06/08/17: Mediport BC + for VRE in 23 hrs (R-Amp, R- tetracycline per lab, suppressed result, R- doxycycline;  S-Linezolid, S-Dapto dose dependent),  Right hand peripheral cx: No growth   06/10/17: Blood culture 2/2: 1 periphery (RHand), 1 from Mediport before removed: no growth  06/11/17: Blood Culture 2/2 (both periphery) - no growth   06/19/17:  Perigastric fluid #1:  GS 10-25 PMNs -  GPB, GNB, GPC in  pairs. 1+ C. Glabrata (R- fluconazole, voriconazole, S- Caspofungin); 2+ Enterococcus faecium (R- amp, PenG, Vanco; S- Linezolid)  06/19/17:  Liver fluid  collection:  GS: 0 PMNs, Fungal and AFB stains negative, 1+ enterococcus faecium (R- Amp, PenG, Vanco, S- Dapto dose dependent, Linezolid)  06/19/17 : Perigastric fluid #2: GS: >25 PMNs, Gram negative bacilli; 3+ C. Glabrata.   07/04/17 U/A negative  07/04/17 Blood Cultures 3/3 (1 set PICC proximal port, 2 sets periphery): NGTD  07/04/17 MRSA amplification Nares: negative  07/05/17 Blood Culture 3/3 (2 PICC, 1 Periph) - NGTD  07/05/17 Liver Abscess (IR drain)- <1PMNs, GPC in prs and chains, 1 colony candida albicans (preliminary), 4+ Enterococcus faecium - two isolates with one now resistant to Daptomycin, the other dose dependent.      Susceptibility      Enterococcus faecium (3)     MIC BP MIC     Ampicillin   Resistant     Daptomycin 8.0 mcg/mL Resistant      Linezolid 2.0 mcg/mL Sensitive      Penicillin-G   Resistant     Vancomycin   Resistant             Susceptibility      Enterococcus faecium (4)     MIC BP MIC     Ampicillin   Resistant     Daptomycin 4.0 mcg/mL Susceptible - Dose Dependent      Linezolid 2.0 mcg/mL Sensitive      Penicillin-G   Resistant     Vancomycin   Resistant             Narrative     LIVER ABSCESS      Specimen Collected: 07/05/17 17:29 Last Resulted: 07/10/17 12:07              Imaging/Other Relevant Diagnostics:     Assessment and Plan:  ID problem(s):   - VRE Bacteremia   - Multiple and extensive Intra-abdominal abscesses growing VRE, enterobacter and Candida glabrata, C.albicans  72 y.o. female with h/o recent PE and pancreatic cancer, s/p whipple procedure (goal of cure), complicated by delayed gastric emptying, elevated LFTs and sepsis, malnutrition 2/2 early satiety and anorexia (requiring PEG), and multiple intraabdominal and hepatic abscesses and drains. Most recent sepsis episode on 5/2 requiring ICU observation/fluids/increased O2 demands, no pressors required, stabilized quickly, source was not definitively identified, returned to [facility] floor on 5/7.  She has since slowly and  steadily continued to improve.  The TPN weaned to off, she is tolerating TFs and taking in small amounts of food from regular diet tray.  Her last abdominal drain was pulled today (07/22/17).  Ertapenem was discontinued on 07/18/17 (2/2 LFTs fluctuating upwards and she had not grown out gram negatives in 2 months ).  Palliative continues to assist with patient's pain control issues and Primary/Nutrition continue to assist pt with improved oral/protein intake.     Currently Amanda Galloway appears improved overall.  She is not as weepy, less c/o pain.  Her white count is normal, now off Ertapenem since 5/16 and Caspofungin discontinued yesterday d/t concern for elevated LFTs and given she had been on Caspo since 06/28/17.  The last abdominal drain (hepatic) was removed on 07/22/17. Her abdomen is soft, still TTP right side, tolerating TFs at night and small amount of oral intake during the day without N/V.  She remains on linezolid since 07/04/17, recommend that continue until 08/02/17 then discontinue.   She [name] stable from an ID perspective.  Recommendations:    Continue Linezolid 600 mg orally twice daily for VRE, watch for DDIs (serotonin syndrome), monitor for myelosuppression, duration through 08/02/17 then discontinue.    Please follow CBC with diff and CMP weekly  (or more often per primary team discretion).     ID Team 3 will continue to follow along    Case and plan discussed with ID attending Dr. Aundria Mems    Thank you for allowing [name] to participate in the care of this patient  Please call with questions/concerns.     Lonzo Candy, ANP-BC  Infectious Diseases, Team 3  Pager [phone]  Office Ph [phone]

## 2017-07-25 NOTE — Consults (Signed)
Wound Consult Note, Initial    Patient Unit: [id number]    Amanda Galloway  72 y.o. year old female was referred by RN for evaluation and treatment recommendations regarding: PEG tube site.   HPI, PMH/PSH, Labs, MAR reviewed.      Patient c/o:    [x]  Pain  []  Burning   []  Itching  []  Odor  []  No complaints at this time    Exam    Patient alert, awake, NAD. Sitting up in chair eating.   Last Braden risk assessment score 17.    PEG tube site with surrounding erythema, tender to touch. Placed 4/12 per chart. One button/fastener still present to R medial aspect. Beginnings of small amount of hypertrophic tissue at entry site. Bolster loose and tightened some during visit. Leaking what appears to be tube feed. Did not witness more leaking during visit.     Area cleansed, Cavilon and a thin layer of Critic Aid barrier ointment applied. Split gauze placed.    Patient education re prevention/treatment  Discussed with bedside RN and patient.     Plan  Consider removal of fastener to allow for further care. Ensure placement of tube.  Turn and position every two hours and PRN  LAL mattress  Offloading:  Use waffle chair cushion  Topical recommendations:     PEG tube site - Cleanse wound bed with normal saline, Apply Cavilon to surrounding skin, apply a thin layer of Critic Aid barrier ointment. Apply twice a day(s) & PRN      Thank you very much for consult.  Wound Nurse will follow-up weekly and PRN.   Please call with any questions or concerns.    Roselee Nova, RN, [employer] [phone]

## 2017-07-25 NOTE — Progress Notes (Signed)
SW continues to follow pt for discharge planning. Medical team is working on weaning PCA. Once this is discontinued it is likely that SNF packet can be submitted. Team has discontinued Caspo and pt remains on PO Linezolid.Will continue to assist as needed and plan for safe discharge. SW to keep daughter and pt updated on discharge planning.

## 2017-07-25 NOTE — Progress Notes (Signed)
Palliative Care Progress Note    HPI: [name] w/ pancreatic CA s/p Whipple 1/25 (treatment for cure) w/ prolonged complicated hospitalization issues w/ infection, nutrition/appetite/nausea/constipation, s/p PEG w/ perf now resolved, most recent CT abd 5/16 showed no new fluid collections and decrease in existing hepatic collections. Pt resumed methadone on 5/8 and has been slowing weaning off her dilaudid PCA. All drains now removed, receiving TFs at night to optimize appetite during day.    Subjective: "I'm eating the steak, it tastes good and it's good protein. I'll get up for a walk. My abd pain is 5/10, (hit PCA button for getting up to walk & tolerated well)"    Objective: chart reviewed, pt using more po dilaudid prn (total 48 mg in last 24 hrs); her PCA use is down from previous day; pt in good spirits this afternoon, finished whole portion of salisbury steak and was agreeable to getting up for walk around unit w/ walker (2x so far today); dtr [name] was not present though we had discussed anticipated plan of stopping PCA today and changing to RN delivered IV dilaudid prn as backup for severe pain not relieved by po dilaudid prn - pt agreeable to this today & was looking forward to not having to take her IV pole w/ her on next walk, understands she can have oral dilaudid Q 2 hrs prn and her RN assured her that she'd have IV dilaudid available if her oral medication was not helping    Palliative Care ROS:  Pain   Moderate  Nausea   Mild  Anorexia   Mild  Anxiety   Mild  Depression   Moderate  Shortness of Breath   Mild  Tiredness/Fatigue   Mild  Drowsiness/Sleepiness   Mild  Constipation   no  Unable to Respond   no  Delirium   no   Last stool 5/22    Physical Examination:   BP: (94-132)/(52-70)   Temp:  [36.2 C (97.2 F)-36.7 C (98.1 F)]   Temp src: Temporal (05/23 1127)  Heart Rate:  [84-100]   Resp:  [16]   SpO2:  [82 %-97 %]   Gen appearance: older Caucasian female, sitting up in recliner, reports RUQ  abd pain moderate 5/10 this afternoon; pt eating better & choosing high protein food   Lungs: easy resp, RA  Heart: reg  Abd: large, soft, +BS, PEG (tolerating TFs at night, better appetite)  Extrem: edematous LEs  Skin:  dry and red LEs, flaky skin  Neuro: A+O x 3, ambulates w/ walker -pt was surprisingly quite receptive to writer's suggestion of getting up for  walking w/ assist - fun loving, asked to play 'Rocky theme song' when making her rounds around unit, thoroughly appreciating the supportive remarks from staff especially PCT Dil!    Assessment/Plan:  [name] w/ pancreatic CA s/p Whipple (treatment for cure) w/ prolonged hospitalization & most recent abd CT revealing decreasing fluid collections w/ no new collections. Pt in very good spirits this afternoon, ate piece of salisbury steak for lung, reports abd pain mild to mod today & controlled w/ dilaudid, did well getting up ambulating around unit w/ walker & standby assist. Pt in agreement w/ stopping PCA & reassured she would still have IV dilaudid prn as backup if oral dilaudid prn not helping her pain.     Pain/dyspnea  Dilaudid PCA  0.4 mg IV Q 15 min prn (received total 8 mg from 5/22 6 am to 5/23 6  am, equiv to 160 mg po ME or 40 mg po dilaudid) stop  Start Dilaudid 0.25 mg IV Q 1 hr prn for severe pain as backup for pain not relieved by oral dilaudid (this dose is purposely subpar to her oral dilaudid dose, if pt unable to take PO then would need more appropriate dose to replace the PO dose)  Dilaudid 8 mg po Q 2hrs prn (x 4 on 5/22, x 2 on 5/23)   Lyrica 75 mg po daily for fibromyalgia -if tolerates, then plan increase to BID on 5/27  Methadone 7.07/12/08 mg po (total 27.5 mg/d) restarted 5/7, last increase 5/14 (ECG on 5/2 w/ QTc 392)  Albuterol neb Q 6 hrs prn (none used)  Lidocaine patch daily x 2 (abd, LUE)  Acetaminophen 650 mg po Q 6 hrs prn fever     Anxiety/agitation/nausea/fatigue  Cymbalta 20 mg po/d  Metoclopramide 10 mg IV Q 6 hrs  ATC  Ondansetron 4 mg IV Q 6 hrs ATC (started 5/16)  Ativan 0.25 mg IV Q 6 hrs prn (x 1 on 5/16)  Encouraged complementary adjuvant therapies for coping, meditation app, aromatherapy, gentle massage     Prevention of opioid induced constipation, last stool 5/22  Bisacodyl supp pr daily   Miralax 17 gm po daily  Senna 17.6 mg po BID    Dry flaky edematous Skin  Aquaphor prn   Eucerin prn for BLEs    GOC/prognosis  Full code  Dtr Rod Mae is HCP (very supportive and informed, former [facility] RN)  Goal currently is to prioritize pt's symptom control, long course though pt endorses agreement w/ continuing current treatment plan, dispo to SNF in hopes of eventually making recovery back to independent living    Pt case discussed w/ RN & covering provider.  We will continue to follow along. Please call if questions/concerns.    Total Time Spent 50 minutes:             >50% of time was spent in counseling and/or coordination of care.     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    Anemia D64.9    Hypothyroidism E03.9    Pancreatic cancer C25.9    Anemia D64.9     hx of Pulmonary embolism I26.99    Acute pulmonary insufficiency J98.4    Depression F32.9    Sepsis A41.9       No Known Allergies (drug, envir, food or latex)    Scheduled Meds:    ondansetron  4 mg Oral Q6H    sodium chloride  1 g Per G Tube TID PC    levothyroxine  150 mcg Oral Daily @ 0600    enoxaparin  40 mg Subcutaneous Daily @ 2100    linezolid  600 mg Oral 2 times per day    pregabalin  75 mg Oral Daily    methadone  7.5 mg Oral Daily    And    methadone  10 mg Oral BID    sennosides  17.6 mg Oral 2 times per day    PROSOURCE NO CARB  30 mL Oral Daily with breakfast     metoclopramide IV  10 mg Intravenous Q6H    polyethylene glycol  17 g  Oral Daily    lactobacillus rhamnosus (GG)  1 capsule Oral Daily    bisacodyl  10 mg Rectal Daily    DULoxetine  20 mg Oral Q24H    lidocaine  1 patch Transdermal Q24H    lidocaine  1 patch Transdermal Q24H       Continuous Infusions:    sodium chloride      HYDROmorphone         PRN Meds:  HYDROmorphone, albuterol, acetaminophen, methyl salicylate-menthol, naloxone, mineral oil-hydrophilic petrolatum, sodium chloride, sodium chloride, sodium chloride, dextrose

## 2017-07-26 ENCOUNTER — Inpatient Hospital Stay: Payer: Medicare (Managed Care)

## 2017-07-26 ENCOUNTER — Other Ambulatory Visit: Payer: Self-pay | Admitting: Cardiology

## 2017-07-26 DIAGNOSIS — R4182 Altered mental status, unspecified: Secondary | ICD-10-CM

## 2017-07-26 DIAGNOSIS — Z86711 Personal history of pulmonary embolism: Secondary | ICD-10-CM

## 2017-07-26 DIAGNOSIS — I509 Heart failure, unspecified: Secondary | ICD-10-CM

## 2017-07-26 DIAGNOSIS — R0689 Other abnormalities of breathing: Secondary | ICD-10-CM

## 2017-07-26 LAB — BASIC METABOLIC PANEL
Anion Gap: 12 (ref 7–16)
CO2: 29 mmol/L — ABNORMAL HIGH (ref 20–28)
Calcium: 9 mg/dL (ref 8.6–10.2)
Chloride: 92 mmol/L — ABNORMAL LOW (ref 96–108)
Creatinine: 0.7 mg/dL (ref 0.51–0.95)
GFR,Black: 101 *
GFR,Caucasian: 87 *
Glucose: 101 mg/dL — ABNORMAL HIGH (ref 60–99)
Lab: 13 mg/dL (ref 6–20)
Potassium: 4.5 mmol/L (ref 3.3–5.1)
Sodium: 133 mmol/L (ref 133–145)

## 2017-07-26 LAB — CBC AND DIFFERENTIAL
Baso # K/uL: 0 10*3/uL (ref 0.0–0.1)
Baso # K/uL: 0.1 10*3/uL (ref 0.0–0.1)
Basophil %: 0.5 %
Basophil %: 0.7 %
Eos # K/uL: 0.4 10*3/uL (ref 0.0–0.4)
Eos # K/uL: 0.4 10*3/uL (ref 0.0–0.4)
Eosinophil %: 5.2 %
Eosinophil %: 4.9 %
Hematocrit: 24 % — ABNORMAL LOW (ref 34–45)
Hematocrit: 24 % — ABNORMAL LOW (ref 34–45)
Hemoglobin: 7.5 g/dL — ABNORMAL LOW (ref 11.2–15.7)
Hemoglobin: 7.6 g/dL — ABNORMAL LOW (ref 11.2–15.7)
IMM Granulocytes #: 0 10*3/uL
IMM Granulocytes #: 0 10*3/uL
IMM Granulocytes: 0.4 %
IMM Granulocytes: 0.5 %
Lymph # K/uL: 0.7 10*3/uL — ABNORMAL LOW (ref 1.2–3.7)
Lymph # K/uL: 1.1 10*3/uL — ABNORMAL LOW (ref 1.2–3.7)
Lymphocyte %: 7.8 %
Lymphocyte %: 14.8 %
MCH: 29 pg/cell (ref 26–32)
MCH: 30 pg/cell (ref 26–32)
MCHC: 31 g/dL — ABNORMAL LOW (ref 32–36)
MCHC: 32 g/dL (ref 32–36)
MCV: 94 fL (ref 79–95)
MCV: 93 fL (ref 79–95)
Mono # K/uL: 0.8 10*3/uL (ref 0.2–0.9)
Mono # K/uL: 1 10*3/uL — ABNORMAL HIGH (ref 0.2–0.9)
Monocyte %: 9.8 %
Monocyte %: 13.9 %
Neut # K/uL: 6.5 10*3/uL — ABNORMAL HIGH (ref 1.6–6.1)
Neut # K/uL: 4.9 10*3/uL (ref 1.6–6.1)
Nucl RBC # K/uL: 0 10*3/uL (ref 0.0–0.0)
Nucl RBC # K/uL: 0 10*3/uL (ref 0.0–0.0)
Nucl RBC %: 0 /100 WBC (ref 0.0–0.2)
Nucl RBC %: 0 /100 WBC (ref 0.0–0.2)
Platelets: 268 10*3/uL (ref 160–370)
Platelets: 234 10*3/uL (ref 160–370)
RBC: 2.6 MIL/uL — ABNORMAL LOW (ref 3.9–5.2)
RBC: 2.6 MIL/uL — ABNORMAL LOW (ref 3.9–5.2)
RDW: 18.2 % — ABNORMAL HIGH (ref 11.7–14.4)
RDW: 18.2 % — ABNORMAL HIGH (ref 11.7–14.4)
Seg Neut %: 76.3 %
Seg Neut %: 65.2 %
WBC: 8.5 10*3/uL (ref 4.0–10.0)
WBC: 7.5 10*3/uL (ref 4.0–10.0)

## 2017-07-26 LAB — COMPREHENSIVE METABOLIC PANEL
ALT: 141 U/L — ABNORMAL HIGH (ref 0–35)
AST: 152 U/L — ABNORMAL HIGH (ref 0–35)
Albumin: 2.9 g/dL — ABNORMAL LOW (ref 3.5–5.2)
Alk Phos: 942 U/L — ABNORMAL HIGH (ref 35–105)
Anion Gap: 11 (ref 7–16)
Bilirubin,Total: 0.3 mg/dL (ref 0.0–1.2)
CO2: 29 mmol/L — ABNORMAL HIGH (ref 20–28)
Calcium: 9 mg/dL (ref 8.6–10.2)
Chloride: 92 mmol/L — ABNORMAL LOW (ref 96–108)
Creatinine: 0.65 mg/dL (ref 0.51–0.95)
GFR,Black: 103 *
GFR,Caucasian: 90 *
Glucose: 127 mg/dL — ABNORMAL HIGH (ref 60–99)
Lab: 12 mg/dL (ref 6–20)
Potassium: 4.6 mmol/L (ref 3.3–5.1)
Sodium: 132 mmol/L — ABNORMAL LOW (ref 133–145)
Total Protein: 6.7 g/dL (ref 6.3–7.7)

## 2017-07-26 LAB — PROTIME-INR
INR: 1.1 (ref 0.9–1.1)
Protime: 12.8 s (ref 10.0–12.9)

## 2017-07-26 LAB — MAGNESIUM: Magnesium: 1.7 mg/dL (ref 1.6–2.5)

## 2017-07-26 LAB — LACTATE, PLASMA: Lactate: 1.1 mmol/L (ref 0.5–2.2)

## 2017-07-26 LAB — PHOSPHORUS: Phosphorus: 5.1 mg/dL — ABNORMAL HIGH (ref 2.7–4.5)

## 2017-07-26 MED ORDER — ALTEPLASE 2 MG IJ SOLR *I*
0.5000 mg | Freq: Once | INTRAMUSCULAR | Status: AC
Start: 2017-07-26 — End: 2017-07-27
  Filled 2017-07-26: qty 2

## 2017-07-26 MED ORDER — SODIUM CHLORIDE 1 GM PO TABS *I*
2.0000 g | ORAL_TABLET | Freq: Three times a day (TID) | ORAL | Status: DC
Start: 2017-07-26 — End: 2017-08-13
  Administered 2017-07-26 – 2017-08-12 (×50): 2 g via GASTROSTOMY
  Filled 2017-07-26 (×58): qty 2

## 2017-07-26 MED ORDER — MAGNESIUM SULFATE 2 GM IN 50 ML *WRAPPED*
2000.0000 mg | Freq: Once | INTRAVENOUS | Status: AC
Start: 2017-07-26 — End: 2017-07-26
  Administered 2017-07-26: 2000 mg via INTRAVENOUS
  Filled 2017-07-26: qty 50

## 2017-07-26 MED ORDER — IOHEXOL 350 MG/ML (OMNIPAQUE) IV SOLN *I*
1.0000 mL | Freq: Once | INTRAVENOUS | Status: AC
Start: 2017-07-27 — End: 2017-07-26
  Administered 2017-07-26: 126 mL via INTRAVENOUS

## 2017-07-26 MED ORDER — ALTEPLASE 2 MG IJ SOLR *I*
0.5000 mg | Freq: Once | INTRAMUSCULAR | Status: AC
Start: 2017-07-26 — End: 2017-07-26
  Administered 2017-07-26: 2 mg
  Filled 2017-07-26: qty 2

## 2017-07-26 MED ORDER — STERILE WATER FOR IRRIGATION IR SOLN *I*
900.0000 mL | Freq: Once | Status: AC
Start: 2017-07-26 — End: 2017-07-26
  Administered 2017-07-26: 900 mL via ORAL

## 2017-07-26 MED ORDER — FUROSEMIDE 10 MG/ML PO SOLN *I*
20.0000 mg | Freq: Every day | ORAL | Status: DC
Start: 2017-07-26 — End: 2017-07-26
  Administered 2017-07-26: 20 mg via ORAL
  Filled 2017-07-26 (×2): qty 2

## 2017-07-26 MED ORDER — SODIUM CHLORIDE 0.9 % IV SOLN WRAPPED *I*
75.0000 mL/h | Status: DC
Start: 2017-07-26 — End: 2017-07-27
  Administered 2017-07-26 (×6): 100 mL/h
  Administered 2017-07-26: 100 mL/h via INTRAVENOUS
  Administered 2017-07-27 (×4): 100 mL/h
  Administered 2017-07-27: 75 mL/h via INTRAVENOUS
  Administered 2017-07-27: 100 mL/h
  Administered 2017-07-27 (×2): 75 mL/h
  Administered 2017-07-27: 100 mL/h via INTRAVENOUS
  Administered 2017-07-27: 75 mL/h
  Administered 2017-07-27: 100 mL/h
  Administered 2017-07-27: 75 mL/h via INTRAVENOUS

## 2017-07-26 MED ORDER — LINEZOLID 600 MG/300ML IV SOLN *I*
600.0000 mg | Freq: Once | INTRAVENOUS | Status: AC
Start: 2017-07-26 — End: 2017-07-26
  Administered 2017-07-26: 600 mg via INTRAVENOUS
  Filled 2017-07-26: qty 300

## 2017-07-26 NOTE — Progress Notes (Addendum)
Assumed care of pt at 0700-1500. Pt appeared slightly confused upon assessment, but answered A&O x3 questions correctly. Q4 VSS on RA. UOP WDL. Ted hose ON bilat. Amb in hall w/ 2 assist, tolerated fairly well. No intake from breakfast and lunch, drinking fluids fine. Q2 PO dilaudid x3, IV dilaudid x1 w/ some effect.     Abd tender. Old drainage and erythema noted around PEG site, appeared to be tube feed. One remaining PEG button fell off per previous shift. Cleansed with NS, Cavilon, barrier cream, then split gauze applied per WOCN rec.     Desat to 88%, back on NC 1L. O2 quickly recovered. Full report given to oncoming shift. Will continue to monitor.

## 2017-07-26 NOTE — Continuity of Care (Signed)
[place] of Quality and Surveillance for Nursing Homes and ICFs/MR  Patient Name  Amanda Galloway MR Number  [id number] Account Number   [facility]                Patient Review Instrument  (HC-PRI)               RUG II:  CB4     I. ADMINISTRATIVE DATA     1. Operating Certificate Number  [id number] 2. Social Security Number  [id number]   3. Official Name of Hospital Completing this Review  [facility]    4A. Patient Name  Amanda Galloway 10. Sex  female   4B. [place] 11A. Date of Hospital Admission or Initial Agency Visit  03/29/2017   5. Date of [facility] Completion  Jul 26, 2017 11B. Date of Alternate level of Care Status in Hospital (if applicable)    6. Account Number   12. Medicaid Number     7. Hospital Room Number  OFB5-10/CHE5-2778 24. Medicare Number  [id number]   4. Name of Unit/Division/Building  [id number] 14. Primary Payor  [employer] MEDICARE   9. Date of Birth  [phone] 55. Reason for [facility] Completion  1 - RHCF application from hospital     II. MEDICAL EVENTS     16. Decubitus Level:        Location:  No reddened skin or breakdown     17. MEDICAL CONDITIONS During the past week:  1=Yes  2=No 18. MEDICAL TREATMENTS 1=Yes  2=No   A. Comatose No A. Tracheostomy Care/Suct No         B. Dehydration No B. Suctioning/General No         C. Internal Bleeding No C. Oxygen (Daily) No              D. Stasis Ulcer No D Respiratory Care (Daily) Yes (Incentive spirometer use post op)         E. Terminally Ill No E. Nasal Gastric Feeding Yes (Two Cal 12 hours overnight via PEG)         F. Contractures No F. Parenteral Feeding No         G. Diabetes Mellitus No G. Wound Care Yes (old drain site dressings as needed, PEG site general care)         H. Urinary Tract Infection No H. Chemotherapy No         I. HIV Infection Symptomatic No I. Transfusion No         J. Accident No J. Dialysis No         K. Ventilator Dependent No K. Bowel and  Bladder Rehab No           L. Catheter (Indwelling or External) No              M. Physical Restraints  (Daytime) No             Adapted from [id number]    Version: 002F  Review Type: Update review  07/26/2017  2 of [place] of Quality and Surveillance for Nursing Homes and ICFs/MR  Patient Name  Amanda Galloway MR Number  [id number] Account Number   [place]  Patient Review Instrument  (HC-PRI)               III. Activities of Daily Living     19. Eating 2-Requires intermittent supervision (that is, verbal encouragement/guidance) and/or minimal physical assistance with minor parts of eating, such as cutting food, buttering bread or opening milk carton.    20. Mobility 3-Walks with constant one-to-one supervision and/or constant physical assistance.    21. Transfer 3-Requires one person to provide constant guidance, steadiness and/or physical assistance.  Patient may participate in transfer.    22. Toileting 2-Requires intermittent supervision or minor physical assistance (for example, clothes adjustment or washing hands).      IV. BEHAVIORS     23. Verbal Disruption No known history   24. Physical Agression No known history   25. Disruptive, Infantile or Socially Inappropriate Behavior No known history   26. Hallucinations No     V. SPECIALIZED SERVICES     27A. Physical Therapy  27B. Occupational Therapy    Level: Restorative therapy- requires and is currently receiving physical and/or occupational therapy for the past week Level: Does not receive.   Actual days/week:1 No - less than 5 times/week Actual days/week:0 No - less than 5 times/wk   Actual hours/week:28 minutes No - less than 2.5 hrs/wk Actual hours/week:0 No - less than 2.5 hrs/wk     28. Number of Physician Visits - 0     VI. DIAGNOSIS     29. Primary Problem: Malignant neoplasm of pancreas, unspecified location of malignancy     VII. PLAN OF CARE SUMMARY     30.  Primary Diagnosis: Encounter Diagnoses   Name Primary?    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   31A. Rehabilitation Potential: Remains appropriate for skilled therapy            Comment:    31B. Current therapy care plan: Treatment Interventions: Restorative PT  PT Frequency: 2-4x/wk  Mobility Recommendations: 1A with RW, please assist wiht ambulation out of room 3x/day  Referral Recommendations: PM&R Consult  Discharge Recommendations: [place]  PT Discharge Equipment Recommended: To be determined  Assessment/Recommendations Reviewed With:: Patient, Nursing  Next PT Visit: progress all mobility; higher level balance with ambulation, dual task.   OT Frequency: 2-3x/wk  Patient Will Benefit From: ADL retraining, Functional transfer training, UE strengthening/ROM, Endurance training, Cognitive re-education, Patient/Family training, Compensatory technique education, IADL training, Exercises.   OT Discharge Recommendations: [place]  OT Discharge Equipment Recommended:  (TBD at rehab)  Additional Comments: Currently, pt requires extensive assist with basic adl tasks and functional transfers and is functioning below baseline of independence.  Also presents with fatigue and generalized weakness.  Recommend SNF rehab prior to returning home to maximize level of independence.   Hospital Stay Recommendations: Encourage participation in  adls and to sit up in chair to improve overall strength. Marland Kitchen  Comment:    32. Medications: See below   33A. Allergies: Patient has no known allergies (drug, envir, food or latex).   33B: Treatments:    33C: Abnormal Labs: See below   33D: Precautions:  falls           Comment:    33E: Pacemaker  no   14F: Diet: Dietary modular: Pro[employer] NoCarb Liquid Protein 30 mL  Diet tube feeding with tray [facility]) Regular          34. Race/Ethnic Group White or Caucasian [1]     52. Certification    I have personally observed/interviewed this patient and completed this H/C PRI. No - Staff/Chart           I certify that the information contained herein is a true abstract of this patient's condition and medical record. Yes   Certified Assessor: Danella Sensing, RN   Identification Number: [id number]             Adapted from [id number]    Version: 002F  Review Type: Update review  07/26/2017  2 of 1    PMH:    Past Medical History:   Diagnosis Date    Acute kidney failure 03/31/2017    Cancer     Depression     Fibromyalgia     Hypothyroidism        Medications:    Current Facility-Administered Medications   Medication Dose Route Frequency    sodium chloride tablet 2 g  2 g Per G Tube TID PC    furosemide (LASIX) 10 MG/ML oral solution 20 mg  20 mg Oral Daily    ondansetron (ZOFRAN) tablet 4 mg  4 mg Oral Q6H    metoclopramide (REGLAN) tablet 10 mg  10 mg Oral TID AC    HYDROmorphone (DILAUDID) injection 0.26 mg  0.26 mg Intravenous Q1H PRN    HYDROmorphone (DILAUDID) tablet 8 mg  8 mg Oral Q2H PRN    sodium chloride 0.9% IV  3 mL/hr Intravenous Continuous    levothyroxine (SYNTHROID, LEVOTHROID) tablet 150 mcg  150 mcg Oral Daily @ 0600    enoxaparin (LOVENOX) injection 40 mg  40 mg Subcutaneous Daily @ 2100    linezolid (ZYVOX) tablet 600 mg  600 mg Oral 2 times per day    pregabalin (LYRICA) capsule 75 mg  75 mg Oral Daily    methadone (DOLOPHINE) tablet 7.5 mg  7.5 mg Oral Daily    And    methadone (DOLOPHINE)  tablet 10 mg  10 mg Oral BID    sennosides (SENOKOT) 8.8 MG/5ML syrup 17.6 mg  17.6 mg Oral 2 times per day    PROSOURCE NO CARB liquid LIQD 30 mL  30 mL Oral Daily with breakfast    albuterol (PROVENTIL) nebulization 2.5 mg  2.5 mg Nebulization Q6H PRN    polyethylene glycol (GLYCOLAX,MIRALAX) powder 17 g  17 g Oral Daily    acetaminophen (TYLENOL) 650 MG/20.3 ML solution 650 mg  650 mg Oral Q6H PRN    lactobacillus rhamnosus (GG) (CULTURELLE) capsule 1 each  1 capsule Oral Daily    methyl salicylate-menthol (BENGAY) topical cream   Apply externally 4x Daily PRN    bisacodyl (DULCOLAX) suppository 10 mg  10 mg Rectal Daily    DULoxetine (CYMBALTA) DR capsule 20 mg  20 mg Oral Q24H    naloxone (NARCAN) 0.4 mg/mL injection 0.1 mg  0.1 mg Intravenous Q5 Min PRN    mineral oil-hydrophilic petrolatum (  AQUAPHOR) ointment   Topical PRN    sodium chloride 0.9 % flush 10 mL  10 mL Intracatheter Q8H PRN    sodium chloride 0.9 % flush 10 mL  10 mL Intracatheter PRN    sodium chloride 0.9 % FLUSH REQUIRED IF PATIENT HAS IV  0-500 mL/hr Intravenous PRN    dextrose 5 % FLUSH REQUIRED IF PATIENT HAS IV  0-500 mL/hr Intravenous PRN    lidocaine (LIDODERM) 5 % patch 1 patch  1 patch Transdermal Q24H    lidocaine (LIDODERM) 5 % patch 1 patch  1 patch Transdermal Q24H     Facility-Administered Medications Ordered in Other Encounters   Medication Dose Route Frequency    etomidate (AMIDATE) 2 mg/mL injection    PRN    rocuronium (ZEMURON) 10 mg/mL injection   Intravenous PRN       Abnormal Labs:    Recent Results (from the past 72 hour(s))   CBC and differential    Collection Time: 07/23/17 10:30 AM   Result Value Ref Range    WBC 9.4 4.0 - 10.0 THOU/uL    RBC 2.4 (L) 3.9 - 5.2 MIL/uL    Hemoglobin 6.9 (L) 11.2 - 15.7 g/dL    Hematocrit 22 (L) 34 - 45 %    MCV 93 79 - 95 fL    MCH 29 26 - 32 pg/cell    MCHC 31 (L) 32 - 36 g/dL    RDW 19.2 (H) 11.7 - 14.4 %    Platelets 325 160 - 370 THOU/uL    Seg Neut % 77.1 %     Lymphocyte % 6.7 %    Monocyte % 12.6 %    Eosinophil % 3.0 %    Basophil % 0.3 %    Neut # K/uL 7.3 (H) 1.6 - 6.1 THOU/uL    Lymph # K/uL 0.6 (L) 1.2 - 3.7 THOU/uL    Mono # K/uL 1.2 (H) 0.2 - 0.9 THOU/uL    Eos # K/uL 0.3 0.0 - 0.4 THOU/uL    Baso # K/uL 0.0 0.0 - 0.1 THOU/uL    Nucl RBC % 0.0 0.0 - 0.2 /100 WBC    Nucl RBC # K/uL 0.0 0.0 - 0.0 THOU/uL    IMM Granulocytes # 0.0 THOU/uL    IMM Granulocytes 0.3 %   Sodium, urine    Collection Time: 07/23/17  4:01 PM   Result Value Ref Range    Sodium,UR 70 mmol/L   Osmolality, urine    Collection Time: 07/23/17  4:01 PM   Result Value Ref Range    Osmolality,UR 320 300 - 900 mOsm/Kg S3M   Basic metabolic panel    Collection Time: 07/23/17  4:05 PM   Result Value Ref Range    Glucose 108 (H) 60 - 99 mg/dL    Sodium 130 (L) 133 - 145 mmol/L    Potassium 4.4 3.3 - 5.1 mmol/L    Chloride 89 (L) 96 - 108 mmol/L    CO2 27 20 - 28 mmol/L    Anion Gap 14 7 - 16    UN 10 6 - 20 mg/dL    Creatinine 0.80 0.51 - 0.95 mg/dL    GFR,Caucasian 74 *    GFR,Black 86 *    Calcium 8.9 8.6 - 10.2 mg/dL   Osmolality    Collection Time: 07/23/17  4:05 PM   Result Value Ref Range    Osmolality 266 (L) 278 - 297 mOsm/Kg H2O  TSH    Collection Time: 07/23/17  4:05 PM   Result Value Ref Range    TSH 20.58 (H) 0.27 - 4.20 uIU/mL   Cortisol    Collection Time: 07/24/17 12:23 AM   Result Value Ref Range    Cortisol,Random 9.1 ug/dL   CBC and differential    Collection Time: 07/24/17  6:32 AM   Result Value Ref Range    WBC 6.8 4.0 - 10.0 THOU/uL    RBC 2.2 (L) 3.9 - 5.2 MIL/uL    Hemoglobin 6.2 (L) 11.2 - 15.7 g/dL    Hematocrit 20 (L) 34 - 45 %    MCV 93 79 - 95 fL    MCH 29 26 - 32 pg/cell    MCHC 31 (L) 32 - 36 g/dL    RDW 19.1 (H) 11.7 - 14.4 %    Platelets 255 160 - 370 THOU/uL    Seg Neut % 64.2 %    Lymphocyte % 9.8 %    Monocyte % 17.8 %    Eosinophil % 7.3 %    Basophil % 0.6 %    Neut # K/uL 4.3 1.6 - 6.1 THOU/uL    Lymph # K/uL 0.7 (L) 1.2 - 3.7 THOU/uL    Mono # K/uL 1.2 (H) 0.2 -  0.9 THOU/uL    Eos # K/uL 0.5 (H) 0.0 - 0.4 THOU/uL    Baso # K/uL 0.0 0.0 - 0.1 THOU/uL    Nucl RBC % 0.0 0.0 - 0.2 /100 WBC    Nucl RBC # K/uL 0.0 0.0 - 0.0 THOU/uL    IMM Granulocytes # 0.0 THOU/uL    IMM Granulocytes 0.3 %   Comprehensive metabolic panel    Collection Time: 07/24/17  6:32 AM   Result Value Ref Range    Sodium 132 (L) 133 - 145 mmol/L    Potassium 4.0 3.3 - 5.1 mmol/L    Chloride 91 (L) 96 - 108 mmol/L    CO2 31 (H) 20 - 28 mmol/L    Anion Gap 10 7 - 16    UN 11 6 - 20 mg/dL    Creatinine 0.72 0.51 - 0.95 mg/dL    GFR,Caucasian 85 *    GFR,Black 97 *    Glucose 133 (H) 60 - 99 mg/dL    Calcium 8.6 8.6 - 10.2 mg/dL    Total Protein 6.4 6.3 - 7.7 g/dL    Albumin 2.9 (L) 3.5 - 5.2 g/dL    Bilirubin,Total 0.3 0.0 - 1.2 mg/dL    AST 152 (H) 0 - 35 U/L    ALT 135 (H) 0 - 35 U/L    Alk Phos 707 (H) 35 - 105 U/L   Magnesium    Collection Time: 07/24/17  6:32 AM   Result Value Ref Range    Magnesium 1.7 1.6 - 2.5 mg/dL   Phosphorus    Collection Time: 07/24/17  6:32 AM   Result Value Ref Range    Phosphorus 4.7 (H) 2.7 - 4.5 mg/dL   T4, free    Collection Time: 07/24/17  6:39 AM   Result Value Ref Range    Free T4 0.8 (L) 0.9 - 1.7 ng/dL   Red blood cells    Collection Time: 07/24/17  7:47 AM   Result Value Ref Range    Blood product type Red Blood Cells     Unit Number [id number]     Dispense status Transfused     Product code  [id number]     Component blood type A Neg     Coding system [id number]     Coded Blood type 0600    Type and screen    Collection Time: 07/24/17  8:17 AM   Result Value Ref Range    ABO RH Blood Type A RH NEG     Antibody Screen Negative    HCT and Hgb    Collection Time: 07/24/17 11:49 PM   Result Value Ref Range    Hemoglobin 7.6 (L) 11.2 - 15.7 g/dL    Hematocrit 24 (L) 34 - 45 %   Hold SST    Collection Time: 07/24/17 11:49 PM   Result Value Ref Range    Hold SST HOLD TUBE    MCHC    Collection Time: 07/24/17 11:49 PM   Result Value Ref Range    MCHC 32 32 - 36 g/dL   Basic  metabolic panel    Collection Time: 07/25/17  2:55 PM   Result Value Ref Range    Glucose 99 60 - 99 mg/dL    Sodium 131 (L) 133 - 145 mmol/L    Potassium 4.6 3.3 - 5.1 mmol/L    Chloride 91 (L) 96 - 108 mmol/L    CO2 28 20 - 28 mmol/L    Anion Gap 12 7 - 16    UN 12 6 - 20 mg/dL    Creatinine 0.68 0.51 - 0.95 mg/dL    GFR,Caucasian 88 *    GFR,Black 102 *    Calcium 9.3 8.6 - 10.2 mg/dL     *Note: Due to a large number of results and/or encounters for the requested time period, some results have not been displayed. A complete set of results can be found in Results Review.

## 2017-07-26 NOTE — Progress Notes (Signed)
SW informed that pt had been weaned off the the PCA. She is now able to be maintained on oral pain medicine. Team has discontinued Casp and pt remains on LInezolid. Pt up and ambulated with nursing staff this afternoon. SNF packet updated with placement office and sent to [facility] pending facilities. PRI requested. Await bed offers at this time. Daughter into visit this afternoon and was updated on packet be resubmitted to SNF rehab.    SW spoke with pt who is nervous about transitioning to next step. Reassured pt that it is normal to feel anxious about the next step especially when she has been here for so long. Support and reassurance offered with some effect. Will continue to plan for safe discharge and assist as needed.

## 2017-07-26 NOTE — Progress Notes (Addendum)
Assumed care 1500-1900. Per previous shift pt desat to 88% in RA pt and showed slight confusion.Pt was put on 1L and was able to recover. PEG site is leaking and dressed per wound consult.   1800: X-ray, PICC occluded ateplase ordered, BC and labs sent awaiting results. Pt amb 158ft.   Report given to Royanne Foots., RN. Will continue to monitor.  Karmen Stabs, RN

## 2017-07-26 NOTE — Plan of Care (Signed)
Problem: Safety  Goal: Patient will remain free of falls  Outcome: Progressing towards goal      Problem: Pain/Comfort  Goal: Patient's pain or discomfort is manageable  Outcome: Progressing towards goal      Problem: Mobility  Goal: Functional status is maintained or improved - Geriatric  Outcome: Progressing towards goal    Goal: Patient's functional status is maintained or improved  Outcome: Progressing towards goal      Problem: Nutrition  Goal: Patient's nutritional status is maintained or improved  Outcome: Progressing towards goal    Goal: Nutritional status is maintained or improved - Geriatric  Outcome: Progressing towards goal      Problem: Cognitive function  Goal: Cognitive function will be maintained  Outcome: Progressing towards goal    Goal: Cognitive function will be maintained or return to baseline  Outcome: Progressing towards goal

## 2017-07-26 NOTE — Plan of Care (Signed)
Bowel Elimination     Elimination patterns are normal or improving Maintaining        Cognitive function     Cognitive function will be maintained Maintaining     Cognitive function will be maintained or return to baseline Maintaining        Fluid and Electrolyte Imbalance     Fluid and Electrolyte imbalance Maintaining        GI Bleeding Elimination     Elimination of patterns are normal or improving Maintaining        Mobility     Functional status is maintained or improved - Geriatric Maintaining     Patient's functional status is maintained or improved Maintaining        Pain/Comfort     Patient's pain or discomfort is manageable Maintaining     Patient's pain or discomfort is manageable Maintaining        Post-Operative Bladder Elimination     Patient is able to empty bladder or return to baseline Maintaining        Post-Operative Bowel Elimination     Elimination pattern is normal or improving Maintaining        Post-Operative Complications     Prevent post-operative complications Maintaining        Post-Operative Hemodynamic Stability     Maintain Hemodynamic Stability Maintaining        Safety     Patient will remain free of falls Maintaining          Nutrition     Patient's nutritional status is maintained or improved Progressing towards goal     Nutritional status is maintained or improved - Geriatric Progressing towards goal        Psychosocial     Demonstrates ability to cope with illness Progressing towards goal

## 2017-07-26 NOTE — Progress Notes (Signed)
Palliative Care Progress Note    HPI: [name] w/ pancreatic CA s/p Whipple 1/25 (treatment for cure) w/ prolonged complicated hospitalization issues w/ infection, nutrition/appetite/nausea/constipation, s/p PEG w/ perf now resolved, most recent CT abd 5/16 showed no new fluid collections and decrease in existing hepatic collections. Pt resumed methadone on 5/8 and weaning off her dilaudid PCA 5/23. All drains now removed, receiving TFs at night to optimize appetite during day. Referrals for SNF rehab pending.    Subjective: very sleepy, not able to arouse - dtr at bedside states pt's been confused, reports new 10/10 LUQ pain and new O2 requirement w/ increased HR    Objective: chart reviewed, pt off dilaudid PCA since yesterday afternoon, pt had been doing well taking her po dilaudid prn consistently only x 1 additional RN delivered IV dilaudid this am & was up for walk; unfortunately pt has experienced significant decline since w/ increased 10/10 LUQ pain requiring several doses dilaudid, now confused and stuporous, new O2 requirement and tachycardia - all signs of concern for sepsis brewing (this was how pt presented in past); dtr [name] at bedside and expressing same concern that "something is not right" - pt's RNs and covering provider made aware of concerns and query re new scans, sending cultures & broadening abx     Palliative Care ROS:  Shortness of Breath   Moderate  Tiredness/Fatigue   Severe  Drowsiness/Sleepiness   Severe  Unable to Respond   yes   Last stool 5/24    Physical Examination:   BP: (110-134)/(58-86)   Temp:  [36.2 C (97.2 F)-37.3 C (99.1 F)]   Temp src: Temporal (05/24 1119)  Heart Rate:  [92-110]   Resp:  [16]   SpO2:  [94 %-96 %]   Gen appearance: older Caucasian female, sitting up in recliner, sleeping and stuporous, not very arousable & per dtr report pt having increased pain & confusion today   Lungs: shallow breathing, NC w/ new O2 requirement  Heart: reg  Abd: large, soft, +BS,  PEG (tolerating TFs at night, better appetite)  Extrem: edematous LEs  Skin:  dry and red LEs, flaky skin  Neuro: significant change in mental status this afternoon, very sleepy and difficult to arouse - provider made aware    Assessment/Plan:  [name] w/ pancreatic CA s/p Whipple (treatment for cure) w/ prolonged hospitalization. Pt stopped PCA yesterday afternoon and had been doing fairly well up until this morning, noted w/ increased severe LUQ abd pain requiring addit prn's and now confused and stuporous w/ new O2 requirement. Providers aware as pt has presented similarly in past when going septic.    Pain/dyspnea  Dilaudid 0.25 mg IV Q 1 hr prn for severe pain as backup for pain not relieved by oral dilaudid - received x 2 today (this dose is purposely subpar to her oral dilaudid dose, if pt unable to take PO then would need more appropriate dose to replace the PO dose)  Dilaudid 8 mg po Q 2hrs prn (x 6 on 5/23, x 4 on 5/24)   Lyrica 75 mg po daily for fibromyalgia -if tolerates, then plan increase to BID on 5/27  Methadone 7.07/12/08 mg po (total 27.5 mg/d) restarted 5/7, last increase 5/14 (ECG on 5/2 w/ QTc 392)  Albuterol neb Q 6 hrs prn (none used)  Lidocaine patch daily x 2 (abd, LUE)  Acetaminophen 650 mg po Q 6 hrs prn fever     Anxiety/agitation/nausea/fatigue  Cymbalta 20 mg po/d  Metoclopramide 10 mg po TID AC  Ondansetron 4 mg IV Q 6 hrs ATC (started 5/16)  Ativan 0.25 mg IV Q 6 hrs prn (x 1 on 5/16)  Encouraged complementary adjuvant therapies for coping, meditation app, aromatherapy, gentle massage     Prevention of opioid induced constipation, last stool 5/24  Bisacodyl supp pr daily   Miralax 17 gm po daily  Senna 17.6 mg po BID    Dry flaky edematous Skin  Aquaphor prn   Eucerin prn for BLEs    GOC/prognosis  Full code  Dtr Rod Mae is HCP (very supportive and informed, former [facility] RN)  Goal currently is to prioritize pt's symptom control, long course though pt endorses agreement w/  continuing current treatment plan, dispo to SNF in hopes of eventually making recovery back to independent living    Pt case discussed w/ RN & covering provider.  We will continue to follow along, will ask one of my PC colleagues to check in over [place]. Please call if questions/concerns.    Total Time Spent 35 minutes:             >50% of time was spent in counseling and/or coordination of care.     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    Anemia D64.9    Hypothyroidism E03.9    Pancreatic cancer C25.9    Anemia D64.9     hx of Pulmonary embolism I26.99    Acute pulmonary insufficiency J98.4    Depression F32.9    Sepsis A41.9       No Known Allergies (drug, envir, food or latex)    Scheduled Meds:    sodium chloride  2 g Per G Tube TID PC    furosemide  20 mg Oral Daily    ondansetron  4 mg Oral Q6H    metoclopramide  10 mg Oral TID AC    levothyroxine  150 mcg Oral Daily @ 0600    enoxaparin  40 mg Subcutaneous Daily @ 2100    linezolid  600 mg Oral 2 times per day    pregabalin  75 mg Oral Daily    methadone  7.5 mg Oral Daily    And    methadone  10 mg Oral BID    sennosides  17.6 mg Oral 2 times per day    PROSOURCE NO CARB  30 mL Oral Daily with breakfast    polyethylene glycol  17 g Oral Daily    lactobacillus rhamnosus (GG)  1 capsule Oral Daily    bisacodyl  10 mg Rectal Daily    DULoxetine  20 mg Oral Q24H    lidocaine  1 patch Transdermal Q24H    lidocaine  1 patch Transdermal Q24H       Continuous Infusions:    sodium chloride         PRN Meds:  HYDROmorphone PF, HYDROmorphone, albuterol, acetaminophen, methyl salicylate-menthol, naloxone, mineral oil-hydrophilic petrolatum, sodium chloride, sodium chloride,  sodium chloride, dextrose

## 2017-07-26 NOTE — Progress Notes (Signed)
Surgical Oncology Surgery Progress Note    Patient: Amanda Galloway    LOS: 119 days    Attending: Ron Agee     INTERVAL HISTORY & SUBJECTIVE  NAE overnight.AVSS   Pain continues to be controlled, now off PCA   Tolerating TFs at goal, and regular diet PO 720   Voiding spontaneously      OBJECTIVE  Physical Exam:  Temp:  [36.2 C (97.2 F)-36.9 C (98.4 F)] 36.9 C (98.4 F)  Heart Rate:  [92-110] 110  Resp:  [16] 16  BP: (110-132)/(58-70) 110/58   GEN: no apparent distress  HEENT: NCAT, face symmetric  CHEST: nonlabored respirations on 2LNC  ABD: soft, distended, mildly tender surrounding G-tube that is CDI  NEURO/MOTOR: alert, appropriate  EXTREMITIES: atraumatic  VASCULAR: feet wwp, 1+ peripheral edema in BLE    Intake/Output:  05/23 0700 - 05/24 0659  In: 1972.4 [P.O.:840; I.V.:97.4]  Out: 1400 [Urine:1400]     Medications:  Current Facility-Administered Medications   Medication    sodium chloride tablet 2 g    magnesium sulfate 2,000 mg in 50 mL IVPB    ondansetron (ZOFRAN) tablet 4 mg    metoclopramide (REGLAN) tablet 10 mg    HYDROmorphone (DILAUDID) injection 0.26 mg    HYDROmorphone (DILAUDID) tablet 8 mg    sodium chloride 0.9% IV    levothyroxine (SYNTHROID, LEVOTHROID) tablet 150 mcg    enoxaparin (LOVENOX) injection 40 mg    linezolid (ZYVOX) tablet 600 mg    pregabalin (LYRICA) capsule 75 mg    methadone (DOLOPHINE) tablet 7.5 mg    And    methadone (DOLOPHINE) tablet 10 mg    sennosides (SENOKOT) 8.8 MG/5ML syrup 17.6 mg    PROSOURCE NO CARB liquid LIQD 30 mL    albuterol (PROVENTIL) nebulization 2.5 mg    polyethylene glycol (GLYCOLAX,MIRALAX) powder 17 g    acetaminophen (TYLENOL) 650 MG/20.3 ML solution 650 mg    lactobacillus rhamnosus (GG) (CULTURELLE) capsule 1 each    methyl salicylate-menthol (BENGAY) topical cream    bisacodyl (DULCOLAX) suppository 10 mg    DULoxetine (CYMBALTA) DR capsule 20 mg    naloxone (NARCAN) 0.4 mg/mL injection 0.1 mg    mineral oil-hydrophilic  petrolatum (AQUAPHOR) ointment    sodium chloride 0.9 % flush 10 mL    sodium chloride 0.9 % flush 10 mL    sodium chloride 0.9 % FLUSH REQUIRED IF PATIENT HAS IV    dextrose 5 % FLUSH REQUIRED IF PATIENT HAS IV    lidocaine (LIDODERM) 5 % patch 1 patch    lidocaine (LIDODERM) 5 % patch 1 patch     Facility-Administered Medications Ordered in Other Encounters   Medication    etomidate (AMIDATE) 2 mg/mL injection    rocuronium (ZEMURON) 10 mg/mL injection         Laboratory values:   Recent Labs      07/26/17   0410  07/24/17   2349  07/24/17   0632   WBC  8.5   --   6.8   Hemoglobin  7.5*  7.6*  6.2*   Hematocrit  24*  24*  20*   Platelets  268   --   255   INR  1.1   --    --      No components found with this basename: APTT, PT Recent Labs      07/26/17   0410  07/25/17   1455  07/24/17   0632   Sodium  132*  131*  132*   Potassium  4.6  4.6  4.0   Chloride  92*  91*  91*   CO2  29*  28  31*   UN  '12  12  11   ' Creatinine  0.65  0.68  0.72   Glucose  127*  99  133*   Calcium  9.0  9.3  8.6   Magnesium  1.7   --   1.7   Phosphorus  5.1*   --   4.7*    Recent Labs      07/26/17   0410  07/24/17   0632   AST  152*  152*   ALT  141*  135*   Alk Phos  942*  707*   Bilirubin,Total  0.3  0.3     Recent Labs      07/26/17   0410  07/24/17   0632   Total Protein  6.7  6.4   Albumin  2.9*  2.9*     No results for input(s): AMY, LIP in the last 72 hours.   GLUCOSE:   No results for input(s): PGLU in the last 72 hours.  Imaging: No results found.     ASSESSMENT  Amanda Galloway is a 72 y.o. female with h/o recent PE and pancreatic cancer POD # 102  status post whipple procedure complicated by delayed gastric emptying.  NGT replaced on 04/04/17. UGI on 04/12/17 concerning for obstruction just beyond the [place] in the efferent limb.  Course complicated on 1/66/06 by rising LFTs and sepsis with CT concerning for cholangitis given biliary tree obstruction and PV compression due to hematoma and afferent limb distention in setting of  PE treatment. Is s/p IR PTC on 04/16/17 and IR percutaneous aspiration of anterior abdominal fluid collection on 04/24/17. IR LUQ perc drain placement 3/1. IR anterior abdominal 54f perc drain placement 3/3. IR anterior perc drain into left hepatic collection 3/6.     PLAN  Regular diet and tolerating TFs at goal    Vent G-tube PRN nausea  Monitor mild hyponatremia, labs MWF  Bowel regimen   Analgesia with PO pain  palliative care assistance   Continue Antibiotics per ID, linezolid, appreciate recs    Heparin gtt stopped 5/20. Remains on ppx lovenox for now   OOB/ambulate, PT/OT   Dispo: SNF pending clinical improvment     Author: ATonia Ghent MD as of: 07/26/2017  at: 6:33 AM

## 2017-07-27 LAB — COMPREHENSIVE METABOLIC PANEL
ALT: 122 U/L — ABNORMAL HIGH (ref 0–35)
AST: 127 U/L — ABNORMAL HIGH (ref 0–35)
Albumin: 2.7 g/dL — ABNORMAL LOW (ref 3.5–5.2)
Alk Phos: 796 U/L — ABNORMAL HIGH (ref 35–105)
Anion Gap: 11 (ref 7–16)
Bilirubin,Total: 0.3 mg/dL (ref 0.0–1.2)
CO2: 29 mmol/L — ABNORMAL HIGH (ref 20–28)
Calcium: 8.6 mg/dL (ref 8.6–10.2)
Chloride: 89 mmol/L — ABNORMAL LOW (ref 96–108)
Creatinine: 0.63 mg/dL (ref 0.51–0.95)
GFR,Black: 104 *
GFR,Caucasian: 91 *
Glucose: 97 mg/dL (ref 60–99)
Lab: 11 mg/dL (ref 6–20)
Potassium: 4 mmol/L (ref 3.3–5.1)
Sodium: 129 mmol/L — ABNORMAL LOW (ref 133–145)
Total Protein: 6.4 g/dL (ref 6.3–7.7)

## 2017-07-27 LAB — URINALYSIS REFLEX TO CULTURE
Blood,UA: NEGATIVE
Leuk Esterase,UA: NEGATIVE
Nitrite,UA: NEGATIVE
Protein,UA: NEGATIVE mg/dL
Specific Gravity,UA: 1.025 (ref 1.002–1.030)
pH,UA: 6 (ref 5.0–8.0)

## 2017-07-27 LAB — T4, FREE: Free T4: 0.9 ng/dL (ref 0.9–1.7)

## 2017-07-27 LAB — T3: T3: 82 ng/dL (ref 80–200)

## 2017-07-27 LAB — TSH: TSH: 9.76 u[IU]/mL — ABNORMAL HIGH (ref 0.27–4.20)

## 2017-07-27 MED ORDER — ERTAPENEM IN NS 1 GM *I*
1000.0000 mg | INTRAVENOUS | Status: DC
Start: 2017-07-27 — End: 2017-07-30
  Administered 2017-07-27 – 2017-07-29 (×3): 1000 mg via INTRAVENOUS
  Filled 2017-07-27 (×4): qty 50

## 2017-07-27 MED ORDER — MEROPENEM 1 GM IN 110 ML NACL MINI BAG *I*
1000.0000 mg | Freq: Three times a day (TID) | INTRAVENOUS | Status: DC
Start: 2017-07-27 — End: 2017-07-27
  Administered 2017-07-27: 1000 mg via INTRAVENOUS
  Filled 2017-07-27 (×4): qty 100

## 2017-07-27 NOTE — Progress Notes (Addendum)
Surgical Oncology Surgery Progress Note    Patient: Amanda Galloway    LOS: 120 days    Attending: [place]  Patient with worsening abdominal pain, confusion, and tachycardia to 110s yesterday. Labs were stable. CTA chest negative for PE. CT A/P showed stable or decreased hepatic collections. Bilateral effusions improved, but atelectasis present. Lactate normal.    Patient reports feeling better this am. Pain better controlled. TFs were held overnight by covering provider and given one dose IV linezolid. Denies any n/v. Having BMs.     OBJECTIVE  Physical Exam:  Temp:  [36 C (96.8 F)-37.3 C (99.1 F)] 36 C (96.8 F)  Heart Rate:  [92-108] 95  Resp:  [16] 16  BP: (90-134)/(48-86) 104/54   GEN: no apparent distress  HEENT: NCAT, face symmetric  CHEST: nonlabored respirations on 2LNC  ABD: soft, distended, mildly tender surrounding G-tube that is CDI  NEURO/MOTOR: alert, appropriate  EXTREMITIES: atraumatic  VASCULAR: feet wwp, 1+ peripheral edema in BLE    Intake/Output:  05/24 0700 - 05/25 0659  In: 2690.8 [P.O.:400; I.V.:990.7]  Out: 1100 [Urine:1100]     Medications:  Current Facility-Administered Medications   Medication    sodium chloride tablet 2 g    sodium chloride 0.9% IV    ondansetron (ZOFRAN) tablet 4 mg    metoclopramide (REGLAN) tablet 10 mg    HYDROmorphone (DILAUDID) injection 0.26 mg    HYDROmorphone (DILAUDID) tablet 8 mg    sodium chloride 0.9% IV    levothyroxine (SYNTHROID, LEVOTHROID) tablet 150 mcg    enoxaparin (LOVENOX) injection 40 mg    linezolid (ZYVOX) tablet 600 mg    pregabalin (LYRICA) capsule 75 mg    methadone (DOLOPHINE) tablet 7.5 mg    And    methadone (DOLOPHINE) tablet 10 mg    sennosides (SENOKOT) 8.8 MG/5ML syrup 17.6 mg    PROSOURCE NO CARB liquid LIQD 30 mL    albuterol (PROVENTIL) nebulization 2.5 mg    polyethylene glycol (GLYCOLAX,MIRALAX) powder 17 g    acetaminophen (TYLENOL) 650 MG/20.3 ML solution 650 mg     lactobacillus rhamnosus (GG) (CULTURELLE) capsule 1 each    methyl salicylate-menthol (BENGAY) topical cream    bisacodyl (DULCOLAX) suppository 10 mg    DULoxetine (CYMBALTA) DR capsule 20 mg    naloxone (NARCAN) 0.4 mg/mL injection 0.1 mg    mineral oil-hydrophilic petrolatum (AQUAPHOR) ointment    sodium chloride 0.9 % flush 10 mL    sodium chloride 0.9 % flush 10 mL    sodium chloride 0.9 % FLUSH REQUIRED IF PATIENT HAS IV    dextrose 5 % FLUSH REQUIRED IF PATIENT HAS IV    lidocaine (LIDODERM) 5 % patch 1 patch    lidocaine (LIDODERM) 5 % patch 1 patch     Facility-Administered Medications Ordered in Other Encounters   Medication    etomidate (AMIDATE) 2 mg/mL injection    rocuronium (ZEMURON) 10 mg/mL injection         Laboratory values:   Recent Labs      07/26/17   1830  07/26/17   0410   WBC  7.5  8.5   Hemoglobin  7.6*  7.5*   Hematocrit  24*  24*   Platelets  234  268   INR   --   1.1     No components found with this basename: APTT, PT Recent Labs      07/27/17   0015  07/26/17   1830  07/26/17   0410   Sodium  129*  133  132*   Potassium  4.0  4.5  4.6   Chloride  89*  92*  92*   CO2  29*  29*  29*   UN  '11  13  12   ' Creatinine  0.63  0.70  0.65   Glucose  97  101*  127*   Calcium  8.6  9.0  9.0   Magnesium   --    --   1.7   Phosphorus   --    --   5.1*    Recent Labs      07/27/17   0015  07/26/17   0410   AST  127*  152*   ALT  122*  141*   Alk Phos  796*  942*   Bilirubin,Total  0.3  0.3     Recent Labs      07/27/17   0015  07/26/17   0410   Total Protein  6.4  6.7   Albumin  2.7*  2.9*     No results for input(s): AMY, LIP in the last 72 hours.   GLUCOSE:   No results for input(s): PGLU in the last 72 hours.  Imaging: Ct Angio Chest    Result Date: 07/27/2017  1. No main, lobar or segmental pulmonary embolus. Limited assessment of the subsegmental pulmonary arteries. Enlarged pulmonary arteries, which can be seen in the clinical setting of pulmonary arterial hypertension. 2. New small  right pleural effusion. Unchanged moderate left pleural effusion. 3. Progressive consolidative changes in the right lower lobe with volume loss most likely represent atelectasis given progressive occlusion of the segmental right lower lobe bronchi, potentially from mucoid impaction. Underlying infection/aspiration is not entirely excluded given the fluid-filled patulous esophagus. 4. Interval development of ground glass opacities in the peripheral left upper lobe may represent infection or inflammation. 5. Dilated esophagus containing contrast. Please see separately dictated report of concurrent CT abdomen and pelvis for findings below the diaphragm. END OF IMPRESSION I have personally reviewed the images and the Resident's/Fellow's interpretation and agree with or edited the findings. [facility] submits this DICOM format image data and final report to the [facility], an independent secure electronic health information exchange, on a reciprocally searchable basis (with patient authorization) for a minimum of 12 months after exam date.    * Abdomen Standard Ap Single View/kub    Result Date: 07/26/2017  There is a single mildly dilated segment of large bowel visualized in the right mid abdomen. Paucity of small bowel gas limits assessment. Overall nonspecific bowel gas pattern. Gastrostomy tube projects over the left upper/mid abdomen. END OF IMPRESSION    Chest Single Frontal View    Result Date: 07/26/2017  Decrease in CHF. Small left pleural effusion END OF IMPRESSION [facility] submits this DICOM format image data and final report to the [facility], an independent secure electronic health information exchange, on a reciprocally searchable basis (with patient authorization) for a minimum of 12 months after exam date.       ASSESSMENT  Amanda Galloway is a 72 y.o. female with h/o recent PE and pancreatic cancer POD # 102  status post whipple procedure complicated by delayed gastric emptying.  NGT replaced on  04/04/17. UGI on 04/12/17 concerning for obstruction just beyond the [place] in the efferent limb.  Course complicated on 1/61/09 by rising LFTs and sepsis with CT concerning for  cholangitis given biliary tree obstruction and PV compression due to hematoma and afferent limb distention in setting of PE treatment. Is s/p IR PTC on 04/16/17 and IR percutaneous aspiration of anterior abdominal fluid collection on 04/24/17. IR LUQ perc drain placement 3/1. IR anterior abdominal 11f perc drain placement 3/3. IR anterior perc drain into left hepatic collection 3/6.     PLAN  Continue regular diet and TFs at goal overnight   Will obtain UA today to complete infectious work-up  Vent G-tube PRN nausea  Monitor mild hyponatremia, continues on salt tabs for now.  Aggressive pulmonary toilet  Bowel regimen   Analgesia with PO pain. Appreciate  palliative care assistance   Continue Antibiotics per ID, PO linezolid, appreciate recs    Heparin gtt stopped 5/20. Remains on ppx lovenox for now   OOB/ambulate, PT/OT   Dispo: SNF pending clinical improvment     Author: MReynolds Bowl MD as of: 07/27/2017  at: 7:57 AM       [facility] GI Surgery Attending:    I saw and evaluated the patient. I have reviewed and edited the resident's/fellow's note and confirm the findings and plan of care as documented above. For Dr. GRon Agee No acute complaints. CT reviewed without acute change (improved) no sign of sepsis today.    Plan continue supportive care, pulm toilet.    LCharolotte Eke MD

## 2017-07-27 NOTE — Progress Notes (Signed)
Brief Update Progress Note    I was alerted that blood culture from 07/26/17 growing E. Coli. Time to positivity was 12.9 hours. Susceptibilities are pending. I personally spoke with Dr. Patrick Jupiter, ID fellow on call, who recommended we add on IV meropenem 1 g q8h for coverage of the bacteremia.    Plan:  - Adding IV Meropenem 1 g q8h  - Continue PO linezolid for coverage of Enterobacter  - Checking UA today    Reynolds Bowl, MD

## 2017-07-27 NOTE — Progress Notes (Signed)
Infectious Diseases Follow Up Note    Personally reviewed chart, labs, medications. Patient seen.     CC:  F/U VRE bacteremia, Liver and abdominal abscesses, s/p Enterobacter cloacae bacteremia, Intra-abdominal abscesses positive for VRE, enterobacter (last grew out 3/6), candida glabrata and candida albicans (both resistant to fluconazole)     Subjective:    Increased confusion, tachycarida, not eating, new cough, yesterday. Blood cultures were obtained 2 sets from peripheral and PICC. Her peripheral stick grew E.coli after 13 hrs.   CT of chest, abdomen, and pelvis with contrast done. Right lower lobe atelectasis/infection. Intraabdominal collections stable/slightly decreased.     Current Meds:  Scheduled Meds:   ertapenem  1,000 mg Intravenous Q24H    sodium chloride  2 g Per G Tube TID PC    ondansetron  4 mg Oral Q6H    metoclopramide  10 mg Oral TID AC    levothyroxine  150 mcg Oral Daily @ 0600    enoxaparin  40 mg Subcutaneous Daily @ 2100    linezolid  600 mg Oral 2 times per day    pregabalin  75 mg Oral Daily    methadone  7.5 mg Oral Daily    And    methadone  10 mg Oral BID    sennosides  17.6 mg Oral 2 times per day    PROSOURCE NO CARB  30 mL Oral Daily with breakfast    polyethylene glycol  17 g Oral Daily    lactobacillus rhamnosus (GG)  1 capsule Oral Daily    bisacodyl  10 mg Rectal Daily    DULoxetine  20 mg Oral Q24H    lidocaine  1 patch Transdermal Q24H    lidocaine  1 patch Transdermal Q24H     Continuous Infusions:   sodium chloride 75 mL/hr (07/27/17 1644)    sodium chloride       PRN Meds:.   calcium carbonate  1,000 mg Oral BID PRN    HYDROmorphone PF  0.5 mg Intravenous Q1H PRN    Or    HYDROmorphone PF  1 mg Intravenous Q1H PRN    LORazepam  0.5 mg Oral Q6H PRN    heparin lock flush  50 Units Intracatheter PRN    ondansetron  4 mg Intravenous Q6H PRN     Objective:  BP: (100-128)/(52-70)   Temp:  [36 C (96.8 F)-36.6 C (97.9 F)]   Temp src: Temporal (05/25  1918)  Heart Rate:  [91-102]   Resp:  [16-18]   SpO2:  [90 %-97 %]     Physical Exam:              General Appearance: in bed, daughter at bedside. No distress  HEENT: Sclera anicteric, MMM, OP clear  Pulm:CTAB, WOB unlabored  CV: RRR, S1S2, no M/R/G, bilateral lower extremity edema  Abdomen:  Softly distended, nontender., PEG intact    Neuro: no gross deficits   Lines/Drains/Tubes: PICC LUE (4/11), PEG (4/12) - both sites benign.       Recent Labs  Lab 07/26/17  1830 07/26/17  0410 07/24/17  2349 07/24/17  0632   WBC 7.5 8.5  --  6.8   Hemoglobin 7.6* 7.5* 7.6* 6.2*   Hematocrit 24* 24* 24* 20*   Platelets 234 268  --  255   Seg Neut % 65.2 76.3  --  64.2   Lymphocyte % 14.8 7.8  --  9.8   Monocyte % 13.9 9.8  --  17.8  Eosinophil % 4.9 5.2  --  7.3       Recent Labs  Lab 07/27/17  0015 07/26/17  1830 07/26/17  0410   Sodium 129* 133 132*   Potassium 4.0 4.5 4.6   CO2 29* 29* 29*   UN _0 Creatinine 0.63 0.70 0.65   Glucose 97 101* 127*   Calcium 8.6 9.0 9.0         Lab results: 07/27/17  0015 07/26/17  0410 07/24/17  0632 07/22/17  0905 07/20/17  0010   Total Protein 6.4 6.7 6.4 6.2* 6.8   Albumin 2.7* 2.9* 2.9* 2.7* 2.9*   ALT 122* 141* 135* 115* 117*   AST 127* 152* 152* 152* 85*   Alk Phos 796* 942* 707* 764* 816*   Bilirubin,Total 0.3 0.3 0.3 0.5 0.2       Lab Results  Component Value Date/Time   CRP 54 (H) 07/19/2017 0107   CRP 46 (H) 07/12/2017 0029   CRP 154 (H) 07/05/2017 0913       Lab Results  Component Value Date/Time   Sedimentation Rate 40 (H) 07/19/2017 0107   Sedimentation Rate >=130 (!) 07/12/2017 0029   Sedimentation Rate 73 (H) 07/05/2017 0913       Micro:   1/25: Intraoperative bile: GS 0 pmns, no organisms, Cx: no growth  2/12: 1 set of blood cultures from the right Mediport (no peripheral bld was sent): positive for Enterobacter cloacae complex in 70 hrs, sensitive to zosyn.   2/18 abdominal GS had >25 PMNs, many GPC in pairs/chains, many GNB and cultures 4+ Enterobacter cloacae  complex. The cultures also grew 1+ Candida glabrata   2/21: BCx from the Right peripheral IV, Mediport, L IJ:  No growth  05/03/17: IR-guided I&D with drain placement of the left upper quadrant collection: GS > 25 PMNs, many GNB and GN diplococci, Cultures with 4+ Enterobacter cloacae complex (resistant to Zosyn and CTX), C. Albicans and C. Glabrata, and Candida tropicalis  05/04/17:    1 set of blood cultures from left IJ: VRE at 15.3 TTP. Daptomycin sensitivities: MIC 51mg/ml (dose dependent),  Linezolid   1 set from periphery: VRE and Enterobacter cloacae at 14.8 TTP   05/05/17:  IR-guided I&D with drain placement of the intrahepatic collection: GS 1-10 PMNs, very few GPC in pairs. Fungal cultures with Candida glabrata  05/05/17: Blood cultures from periphery, Mediport and IJ: no growth  05/04/17: Urine cultures obtained (for unclear reason): no growth  05/08/17: Abscess GS: >25 PMNs, many GPC in chains, few GNB. Aerobic cultures growing 2+ Enterobacter (ertapenem, Cipro, TMP/SMX-sensitive) and 3+VRE, Fungal: C. Glabrata (sens to caspo, vori and dose dependent sens to fluconazole)  06/06/17: 2 sets of BCx (1 set each from periphery and Mediport) - taken after restarting ertapenem: IVAD grew VRE ttp 18.2 hours,  Periphery (Left arm) grew VRE ttp 17.3.  06/07/17 Pleural fluid (left) GS 1-10 PMN's, 1-10 Nucleated white cells, No organisms seen and 1604 nucleated cells. No growth on culture.   06/07/17 Abdominal abscess: labs cancelled - no specimens received.   06/08/17: Mediport BC + for VRE in 23 hrs (R-Amp, R- tetracycline per lab, suppressed result, R- doxycycline;  S-Linezolid, S-Dapto dose dependent),  Right hand peripheral cx: No growth   06/10/17: Blood culture 2/2: 1 periphery (RHand), 1 from Mediport before removed: no growth  06/11/17: Blood Culture 2/2 (both periphery) - no growth   06/19/17:  Perigastric fluid #1:  GS 10-25 PMNs -  GPB, GNB, GPC in pairs. 1+ C. Glabrata (R- fluconazole, voriconazole, S- Caspofungin); 2+  Enterococcus faecium (R- amp, PenG, Vanco; S- Linezolid)  06/19/17:  Liver fluid collection:  GS: 0 PMNs, Fungal and AFB stains negative, 1+ enterococcus faecium (R- Amp, PenG, Vanco, S- Dapto dose dependent, Linezolid)  06/19/17 : Perigastric fluid #2: GS: >25 PMNs, Gram negative bacilli; 3+ C. Glabrata.   07/04/17 U/A negative  07/04/17 Blood Cultures 3/3 (1 set PICC proximal port, 2 sets periphery): NGTD  07/04/17 MRSA amplification Nares: negative  07/05/17 Blood Culture 3/3 (2 PICC, 1 Periph) - NGTD  07/05/17 Liver Abscess (IR drain)- <1PMNs, GPC in prs and chains, 1 colony candida albicans (preliminary), 4+ Enterococcus faecium - two isolates with one now resistant to Daptomycin, the other dose dependent.      Susceptibility      Enterococcus faecium (3)     MIC BP MIC     Ampicillin   Resistant     Daptomycin 8.0 mcg/mL Resistant      Linezolid 2.0 mcg/mL Sensitive      Penicillin-G   Resistant     Vancomycin   Resistant             Susceptibility      Enterococcus faecium (4)     MIC BP MIC     Ampicillin   Resistant     Daptomycin 4.0 mcg/mL Susceptible - Dose Dependent      Linezolid 2.0 mcg/mL Sensitive      Penicillin-G   Resistant     Vancomycin   Resistant             Narrative     LIVER ABSCESS      Specimen Collected: 07/05/17 17:29 Last Resulted: 07/10/17 12:07          5/24: BCx 2 sets 1 peripheral 1 picc. Peripheral grew E.coli after 13 hrs sensitivities pending.  UA no WBCS    Imaging/Other Relevant Diagnostics:     Assessment and Plan:  ID problem(s):   - E.coli bacteremia: from intraabdominal collections vs. Aspiration pneumonia.  - VRE Bacteremia   - Multiple and extensive Intra-abdominal abscesses growing VRE, enterobacter and Candida glabrata, C.albicans  72 y.o. female with h/o recent PE and pancreatic cancer, s/p whipple procedure (goal of cure), complicated by delayed gastric emptying, elevated LFTs and sepsis, malnutrition 2/2 early satiety and anorexia (requiring PEG), and multiple intraabdominal  and hepatic abscesses and drains. Most recent sepsis episode on 5/2 requiring ICU observation/fluids/increased O2 demands, no pressors required, stabilized quickly, source was not definitively identified, returned to [facility] floor on 5/7.  She has since slowly and steadily continued to improve.  The TPN weaned to off, she is tolerating TFs and taking in small amounts of food from regular diet tray.  Her last abdominal drain was pulled today (07/22/17).  Ertapenem was discontinued on 07/18/17 (2/2 LFTs fluctuating upwards and she had not grown out gram negatives in 2 months ).  Palliative continues to assist with patient's pain control issues and Primary/Nutrition continue to assist pt with improved oral/protein intake.     Developed E.coli bacteremia 5/24 in 1 / 2 sets (from peripehral). Source could be intraabdominal or possibly an aspiration pneumonia.     Recommendations:    Switch to ertapenem 1 g q 24 hrs, as this should be equivalent to meropenem for E.coli and is slightly narrower (no antipseudomonal coverage which is not needed yet in this patient)   F/u sensitivities   Check  sputum culture   Repeat 2 sets of blood cultures.   Continue Linezolid 600 mg orally twice daily for VRE, watch for DDIs (serotonin syndrome), monitor for myelosuppression, duration through 08/02/17 then discontinue.     D/w Dr. Dayle Points  ID fellow, PGY 4  Pager [phone]

## 2017-07-27 NOTE — Plan of Care (Signed)
Problem: Safety  Goal: Patient will remain free of falls  Outcome: Progressing towards goal      Problem: Pain/Comfort  Goal: Patient's pain or discomfort is manageable  Outcome: Progressing towards goal    Goal: Patient's pain or discomfort is manageable  Outcome: Progressing towards goal      Problem: Mobility  Goal: Functional status is maintained or improved - Geriatric  Outcome: Progressing towards goal    Goal: Patient's functional status is maintained or improved  Outcome: Progressing towards goal      Problem: Nutrition  Goal: Patient's nutritional status is maintained or improved  Outcome: Progressing towards goal    Goal: Nutritional status is maintained or improved - Geriatric  Outcome: Progressing towards goal      Problem: Cognitive function  Goal: Cognitive function will be maintained  Outcome: Progressing towards goal    Goal: Cognitive function will be maintained or return to baseline  Outcome: Progressing towards goal      Problem: Bowel Elimination  Goal: Elimination patterns are normal or improving  Outcome: Progressing towards goal      Problem: Post-Operative Complications  Goal: Prevent post-operative complications  Outcome: Progressing towards goal      Problem: Psychosocial  Goal: Demonstrates ability to cope with illness  Outcome: Progressing towards goal

## 2017-07-27 NOTE — Progress Notes (Signed)
Palliative Care Progress Note    Subjective: Amanda Galloway was seated in her chair at bedside and was sleeping, she did eventually wake up when I spoke her name. She had trouble staying engaged, however. She complained of 8/10 abdominal pain then fell back asleep quickly.  Of note on chart review she had a blood culture come back positive from yesterday for E. coli.  Her antibiotics are being broadened.  She received 4 doses of by mouth Dilaudid in the last 24 hours or so.  She has also received 3 doses of IV Dilaudid.    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    Physical Examination:   BP: (90-128)/(48-70)   Temp:  [36 C (96.8 F)-36.6 C (97.9 F)]   Temp src: Temporal (05/25 1102)  Heart Rate:  [92-107]   Resp:  [16-18]   SpO2:  [88 %-97 %]   Lethargic, able to interact but then will doze off again.  At times she would stare off and have to be redirected to our conversation.    Scheduled Meds:    meropenem  1,000 mg Intravenous Q8H    sodium chloride  2 g Per G Tube TID PC    ondansetron  4 mg Oral Q6H    metoclopramide  10 mg Oral TID AC    levothyroxine  150 mcg Oral Daily @ 0600    enoxaparin  40 mg Subcutaneous Daily @ 2100    linezolid  600 mg Oral 2 times per day    pregabalin  75 mg Oral Daily    methadone  7.5 mg Oral Daily    And    methadone  10 mg Oral BID    sennosides  17.6 mg Oral 2 times per day    PROSOURCE NO CARB  30 mL Oral Daily with breakfast    polyethylene glycol  17 g Oral Daily    lactobacillus rhamnosus (GG)  1 capsule Oral  Daily    bisacodyl  10 mg Rectal Daily    DULoxetine  20 mg Oral Q24H    lidocaine  1 patch Transdermal Q24H    lidocaine  1 patch Transdermal Q24H     Continuous Infusions:    sodium chloride 75 mL/hr (07/27/17 0943)    sodium chloride         PRN Meds:  HYDROmorphone PF, HYDROmorphone, albuterol, acetaminophen, methyl salicylate-menthol, naloxone, mineral oil-hydrophilic petrolatum, sodium chloride, sodium chloride, sodium chloride, dextrose    Assessment/Plan:  Amanda Galloway is a 72 year old woman with a prolonged hospitalization after Whipple for pancreatic cancer with multiple complications, now with Ecoli bacteremia.  I am just meeting Amanda Galloway today for the first time.  She is lethargic and this could be secondary to her now known E. coli bacteremia.  I would not recommend any changes in her medications at this time until her level of alertness is more stable.  While she did  complain of significant pain she did appear comfortable and was able to fall asleep during our conversation though would wake again with verbal prompting.     Total Time Spent 35 minutes:   >50% of time was spent in counseling and/or coordination of care.     Amanda Dark, MD  07/27/2017

## 2017-07-28 LAB — CBC AND DIFFERENTIAL
Baso # K/uL: 0.1 10*3/uL (ref 0.0–0.1)
Basophil %: 1 %
Eos # K/uL: 0.7 10*3/uL — ABNORMAL HIGH (ref 0.0–0.4)
Eosinophil %: 11.5 %
Hematocrit: 23 % — ABNORMAL LOW (ref 34–45)
Hemoglobin: 7.1 g/dL — ABNORMAL LOW (ref 11.2–15.7)
IMM Granulocytes #: 0 10*3/uL
IMM Granulocytes: 0.2 %
Lymph # K/uL: 1 10*3/uL — ABNORMAL LOW (ref 1.2–3.7)
Lymphocyte %: 16.3 %
MCH: 29 pg/cell (ref 26–32)
MCHC: 31 g/dL — ABNORMAL LOW (ref 32–36)
MCV: 95 fL (ref 79–95)
Mono # K/uL: 0.9 10*3/uL (ref 0.2–0.9)
Monocyte %: 15.3 %
Neut # K/uL: 3.2 10*3/uL (ref 1.6–6.1)
Nucl RBC # K/uL: 0 10*3/uL (ref 0.0–0.0)
Nucl RBC %: 0 /100 WBC (ref 0.0–0.2)
Platelets: 243 10*3/uL (ref 160–370)
RBC: 2.4 MIL/uL — ABNORMAL LOW (ref 3.9–5.2)
RDW: 18.4 % — ABNORMAL HIGH (ref 11.7–14.4)
Seg Neut %: 55.7 %
WBC: 5.8 10*3/uL (ref 4.0–10.0)

## 2017-07-28 LAB — COMPREHENSIVE METABOLIC PANEL
ALT: 101 U/L — ABNORMAL HIGH (ref 0–35)
AST: 84 U/L — ABNORMAL HIGH (ref 0–35)
Albumin: 2.6 g/dL — ABNORMAL LOW (ref 3.5–5.2)
Alk Phos: 750 U/L — ABNORMAL HIGH (ref 35–105)
Anion Gap: 10 (ref 7–16)
Bilirubin,Total: 0.2 mg/dL (ref 0.0–1.2)
CO2: 30 mmol/L — ABNORMAL HIGH (ref 20–28)
Calcium: 8.7 mg/dL (ref 8.6–10.2)
Chloride: 94 mmol/L — ABNORMAL LOW (ref 96–108)
Creatinine: 0.65 mg/dL (ref 0.51–0.95)
GFR,Black: 103 *
GFR,Caucasian: 90 *
Glucose: 104 mg/dL — ABNORMAL HIGH (ref 60–99)
Lab: 10 mg/dL (ref 6–20)
Potassium: 4.3 mmol/L (ref 3.3–5.1)
Sodium: 134 mmol/L (ref 133–145)
Total Protein: 6.3 g/dL (ref 6.3–7.7)

## 2017-07-28 NOTE — Progress Notes (Signed)
Palliative Care Progress Note    Subjective: Halen was seated in her chair at bedside. She was drowsy but awoke to her name fairly easily. She received one dose of IV dilaudid last night and 4 doses of oral dilaudid. Her morning dose of methadone was held yesterday. She complained of abdominal pain when I woke her but stated that her pain is better today than it was yesterday. She is discouraged with another set back with the new infection she is being treated for. 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    Physical Examination:   BP: (100-110)/(56-60)   Temp:  [36 C (96.8 F)-36.8 C (98.2 F)]   Temp src: Temporal (05/26 0830)  Heart Rate:  [91-102]   Resp:  [16-17]   SpO2:  [91 %-97 %]   Drowsy but more alert than yesterday and able to focus and engage more in conversation. At times she grimaces but smiles and interacts appropriately    Scheduled Meds:    ertapenem  1,000 mg Intravenous Q24H    sodium chloride  2 g Per G Tube TID PC    ondansetron  4 mg Oral Q6H    metoclopramide  10 mg Oral TID AC    levothyroxine  150 mcg Oral Daily @ 0600    enoxaparin  40 mg Subcutaneous Daily @ 2100    linezolid  600 mg Oral 2 times per day    pregabalin  75 mg Oral Daily    methadone  7.5 mg Oral Daily    And    methadone  10 mg Oral BID    sennosides  17.6 mg Oral 2  times per day    PROSOURCE NO CARB  30 mL Oral Daily with breakfast    polyethylene glycol  17 g Oral Daily    lactobacillus rhamnosus (GG)  1 capsule Oral Daily    bisacodyl  10 mg Rectal Daily    DULoxetine  20 mg Oral Q24H    lidocaine  1 patch Transdermal Q24H    lidocaine  1 patch Transdermal Q24H     Continuous Infusions:    sodium chloride         PRN Meds:  HYDROmorphone PF, HYDROmorphone, albuterol, acetaminophen, methyl salicylate-menthol, naloxone, mineral oil-hydrophilic petrolatum, sodium chloride, sodium chloride, sodium chloride, dextrose    Assessment/Plan:  Noura is a 72 year old woman with a prolonged hospitalization after Whipple for pancreatic cancer with multiple complications, now with Ecoli bacteremia. Veola was definitely more alert this morning and able to engage more in conversation. She acknowledged being frustrated with all that she has been through and yet another set back of an infection. She  has not received as much pain medication as she could in her prns, therefore, would not make any changes at this time.     Total Time Spent 35 minutes:   >50% of time was spent in counseling and/or coordination of care.     Ardith Dark, MD  07/28/2017

## 2017-07-28 NOTE — Progress Notes (Addendum)
Surgical Oncology Surgery Progress Note    Patient: Amanda Galloway    LOS: 121 days    Attending: [place]  Patient with E. Coli growing from blood cultures on 07/26/17. Started on meropenem yesterday and subsequently switched to ertapenem per ID. Suspect possible aspiration pneumonia as source.     Patient still confused and more lethargic than usual. Vitals have been stable, afebrile. Denies any nausea and tolerating diet/TFs. Took in 1 L PO, no emesis. Denies any nausea.   WBC normal. Na normalized.     OBJECTIVE  Physical Exam:  Temp:  [36 C (96.8 F)-36.6 C (97.9 F)] 36.6 C (97.9 F)  Heart Rate:  [91-102] 96  Resp:  [16-18] 16  BP: (100-128)/(56-70) 110/60   GEN: no apparent distress  HEENT: NCAT, face symmetric  CHEST: nonlabored respirations on 2LNC  ABD: soft, mildly distended, minimally tender surrounding G-tube that is CDI, minimal erythema  NEURO/MOTOR: alert, appropriate  EXTREMITIES: atraumatic  VASCULAR: feet wwp, 1+ peripheral edema in BLE    Intake/Output:  05/25 0700 - 05/26 0659  In: 2711.7 [P.O.:1010; I.V.:851.7]  Out: 300 [Urine:300]     Medications:  Current Facility-Administered Medications   Medication    ertapenem (INVANZ) IV 1,000 mg    sodium chloride tablet 2 g    ondansetron (ZOFRAN) tablet 4 mg    metoclopramide (REGLAN) tablet 10 mg    HYDROmorphone (DILAUDID) injection 0.26 mg    HYDROmorphone (DILAUDID) tablet 8 mg    sodium chloride 0.9% IV    levothyroxine (SYNTHROID, LEVOTHROID) tablet 150 mcg    enoxaparin (LOVENOX) injection 40 mg    linezolid (ZYVOX) tablet 600 mg    pregabalin (LYRICA) capsule 75 mg    methadone (DOLOPHINE) tablet 7.5 mg    And    methadone (DOLOPHINE) tablet 10 mg    sennosides (SENOKOT) 8.8 MG/5ML syrup 17.6 mg    PROSOURCE NO CARB liquid LIQD 30 mL    albuterol (PROVENTIL) nebulization 2.5 mg    polyethylene glycol (GLYCOLAX,MIRALAX) powder 17 g    acetaminophen (TYLENOL) 650 MG/20.3 ML solution 650 mg     lactobacillus rhamnosus (GG) (CULTURELLE) capsule 1 each    methyl salicylate-menthol (BENGAY) topical cream    bisacodyl (DULCOLAX) suppository 10 mg    DULoxetine (CYMBALTA) DR capsule 20 mg    naloxone (NARCAN) 0.4 mg/mL injection 0.1 mg    mineral oil-hydrophilic petrolatum (AQUAPHOR) ointment    sodium chloride 0.9 % flush 10 mL    sodium chloride 0.9 % flush 10 mL    sodium chloride 0.9 % FLUSH REQUIRED IF PATIENT HAS IV    dextrose 5 % FLUSH REQUIRED IF PATIENT HAS IV    lidocaine (LIDODERM) 5 % patch 1 patch    lidocaine (LIDODERM) 5 % patch 1 patch     Facility-Administered Medications Ordered in Other Encounters   Medication    etomidate (AMIDATE) 2 mg/mL injection    rocuronium (ZEMURON) 10 mg/mL injection         Laboratory values:   Recent Labs      07/28/17   0025  07/26/17   1830  07/26/17   0410   WBC  5.8  7.5  8.5   Hemoglobin  7.1*  7.6*  7.5*   Hematocrit  23*  24*  24*   Platelets  243  234  268   INR   --    --   1.1  No components found with this basename: APTT, PT Recent Labs      07/28/17   0025  07/27/17   0015   07/26/17   0410   Sodium  134  129*   < >  132*   Potassium  4.3  4.0   < >  4.6   Chloride  94*  89*   < >  92*   CO2  30*  29*   < >  29*   UN  10  11   < >  12   Creatinine  0.65  0.63   < >  0.65   Glucose  104*  97   < >  127*   Calcium  8.7  8.6   < >  9.0   Magnesium   --    --    --   1.7   Phosphorus   --    --    --   5.1*    < > = values in this interval not displayed.    Recent Labs      07/28/17   0025  07/27/17   0015   AST  84*  127*   ALT  101*  122*   Alk Phos  750*  796*   Bilirubin,Total  0.2  0.3     Recent Labs      07/28/17   0025  07/27/17   0015   Total Protein  6.3  6.4   Albumin  2.6*  2.7*     No results for input(s): AMY, LIP in the last 72 hours.   GLUCOSE:   No results for input(s): PGLU in the last 72 hours.  Imaging: Ct Angio Chest    Result Date: 07/27/2017  1. No main, lobar or segmental pulmonary embolus. Limited assessment of  the subsegmental pulmonary arteries. Enlarged pulmonary arteries, which can be seen in the clinical setting of pulmonary arterial hypertension. 2. New small right pleural effusion. Unchanged moderate left pleural effusion. 3. Progressive consolidative changes in the right lower lobe with volume loss most likely represent atelectasis given progressive occlusion of the segmental right lower lobe bronchi, potentially from mucoid impaction. Underlying infection/aspiration is not entirely excluded given the fluid-filled patulous esophagus. 4. Interval development of ground glass opacities in the peripheral left upper lobe may represent infection or inflammation. 5. Dilated esophagus containing contrast. Please see separately dictated report of concurrent CT abdomen and pelvis for findings below the diaphragm. END OF IMPRESSION I have personally reviewed the images and the Resident's/Fellow's interpretation and agree with or edited the findings. [facility] submits this DICOM format image data and final report to the [facility], an independent secure electronic health information exchange, on a reciprocally searchable basis (with patient authorization) for a minimum of 12 months after exam date.    Ct Abdomen And Pelvis With Contrast    Result Date: 07/27/2017  Status post that procedure. Interval removal of the biliary drain. Stable intrahepatic fluid collection. Slight decrease in the peripheral hepatic fluid collection. END OF IMPRESSION [facility] submits this DICOM format image data and final report to the [facility], an independent secure electronic health information exchange, on a reciprocally searchable basis (with patient authorization) for a minimum of 12 months after exam date.    * Abdomen Standard Ap Single View/kub    Result Date: 07/26/2017  There is a single mildly dilated segment of large bowel visualized in the right mid abdomen. Paucity of  small bowel gas limits assessment. Overall nonspecific  bowel gas pattern. Gastrostomy tube projects over the left upper/mid abdomen. END OF IMPRESSION    Chest Single Frontal View    Result Date: 07/26/2017  Decrease in CHF. Small left pleural effusion END OF IMPRESSION [facility] submits this DICOM format image data and final report to the [facility], an independent secure electronic health information exchange, on a reciprocally searchable basis (with patient authorization) for a minimum of 12 months after exam date.       ASSESSMENT  Amanda Galloway is a 72 y.o. female with h/o recent PE and pancreatic cancer POD # 102  status post whipple procedure complicated by delayed gastric emptying.  NGT replaced on 04/04/17. UGI on 04/12/17 concerning for obstruction just beyond the [place] in the efferent limb.  Course complicated on 04/15/92 by rising LFTs and sepsis with CT concerning for cholangitis given biliary tree obstruction and PV compression due to hematoma and afferent limb distention in setting of PE treatment. Is s/p IR PTC on 04/16/17 and IR percutaneous aspiration of anterior abdominal fluid collection on 04/24/17. IR LUQ perc drain placement 3/1. IR anterior abdominal 54f perc drain placement 3/3. IR anterior perc drain into left hepatic collection 3/6. Developed E. Coli bacteremia on 07/26/17 presumed secondary to aspiration pneumonia.    PLAN   Continue Antibiotics per ID, PO linezolid and IV ertapenem. Will follow blood culture sensitivities. Obtain sputum culture today. Appreciate recs   Continue regular diet and TFs at goal overnight   Vent G-tube PRN nausea  Monitor mild hyponatremia, continues on salt tabs for now.  Aggressive pulmonary toilet  Bowel regimen   Analgesia with PO pain. Appreciate  palliative care assistance   Heparin gtt stopped 5/20. Remains on ppx lovenox for now   OOB/ambulate, PT/OT   Dispo: SNF pending clinical improvment     Author: MReynolds Bowl MD as of: 07/28/2017  at: 7:17 AM       [facility] Surgery Attending:    I saw and evaluated  the patient. I have reviewed and edited the resident's/fellow's note and confirm the findings and plan of care as documented above. For Dr. GRon Agee AVSS. Was able to smile. Appreciate ID recs for possible PNA. Seems better than yesterday.    LCharolotte Eke MD

## 2017-07-28 NOTE — Progress Notes (Signed)
Assumed care of pt at 0700.  VSS  . Remains sleepy but, arouses to voice .  Pt up to [facility] c writer this morning  Voided large amount of urine .  Tolerated tube feeds , G tube  site intact , new split dressing applied .   Pt c   C/o pain in abdomen , 8-10.   Received  Scheduled medications as well  as lidocaine patches .  Pt  Up in chair  This morning , continues to have a poor appetite .  receiving cyclical tube feedings .  Pt receiving scheduled  Reglan and Zofran .   Pt emotional at times , is frustrated by all the setbacks she has  endured  .  Emotional support given .  Pt seen by Palliative Care this morning , see note for full details .   Pt currently sleeping in chair , appears more comfortable , call bell in reach.   Report given to oncoming shift .

## 2017-07-29 DIAGNOSIS — R05 Cough: Secondary | ICD-10-CM

## 2017-07-29 LAB — CBC AND DIFFERENTIAL
Baso # K/uL: 0 10*3/uL (ref 0.0–0.1)
Basophil %: 0.7 %
Eos # K/uL: 0.6 10*3/uL — ABNORMAL HIGH (ref 0.0–0.4)
Eosinophil %: 11.9 %
Hematocrit: 23 % — ABNORMAL LOW (ref 34–45)
Hemoglobin: 7.1 g/dL — ABNORMAL LOW (ref 11.2–15.7)
IMM Granulocytes #: 0 10*3/uL
IMM Granulocytes: 0.2 %
Lymph # K/uL: 1 10*3/uL — ABNORMAL LOW (ref 1.2–3.7)
Lymphocyte %: 18.7 %
MCH: 29 pg/cell (ref 26–32)
MCHC: 30 g/dL — ABNORMAL LOW (ref 32–36)
MCV: 95 fL (ref 79–95)
Mono # K/uL: 0.9 10*3/uL (ref 0.2–0.9)
Monocyte %: 15.9 %
Neut # K/uL: 2.8 10*3/uL (ref 1.6–6.1)
Nucl RBC # K/uL: 0 10*3/uL (ref 0.0–0.0)
Nucl RBC %: 0 /100 WBC (ref 0.0–0.2)
Platelets: 234 10*3/uL (ref 160–370)
RBC: 2.5 MIL/uL — ABNORMAL LOW (ref 3.9–5.2)
RDW: 18.2 % — ABNORMAL HIGH (ref 11.7–14.4)
Seg Neut %: 52.6 %
WBC: 5.4 10*3/uL (ref 4.0–10.0)

## 2017-07-29 LAB — CRP: CRP: 63 mg/L — ABNORMAL HIGH (ref 0–10)

## 2017-07-29 LAB — EKG 12-LEAD
P: 41 deg
PR: 180 ms
QRS: 33 deg
QRSD: 68 ms
QT: 352 ms
QTc: 434 ms
Rate: 91 {beats}/min
Severity: BORDERLINE
Statement: BORDERLINE
T: 70 deg

## 2017-07-29 LAB — COMPREHENSIVE METABOLIC PANEL
ALT: 113 U/L — ABNORMAL HIGH (ref 0–35)
AST: 184 U/L — ABNORMAL HIGH (ref 0–35)
Albumin: 2.8 g/dL — ABNORMAL LOW (ref 3.5–5.2)
Alk Phos: 1241 U/L — ABNORMAL HIGH (ref 35–105)
Anion Gap: 10 (ref 7–16)
Bilirubin,Total: 0.3 mg/dL (ref 0.0–1.2)
CO2: 29 mmol/L — ABNORMAL HIGH (ref 20–28)
Calcium: 8.7 mg/dL (ref 8.6–10.2)
Chloride: 96 mmol/L (ref 96–108)
Creatinine: 0.62 mg/dL (ref 0.51–0.95)
GFR,Black: 105 *
GFR,Caucasian: 91 *
Glucose: 121 mg/dL — ABNORMAL HIGH (ref 60–99)
Lab: 11 mg/dL (ref 6–20)
Potassium: 4.3 mmol/L (ref 3.3–5.1)
Sodium: 135 mmol/L (ref 133–145)
Total Protein: 6.6 g/dL (ref 6.3–7.7)

## 2017-07-29 LAB — TRIGLYCERIDES: Triglycerides: 136 mg/dL

## 2017-07-29 LAB — PHOSPHORUS: Phosphorus: 4.5 mg/dL (ref 2.7–4.5)

## 2017-07-29 LAB — FUNGUS CULTURE

## 2017-07-29 LAB — AEROBIC CULTURE

## 2017-07-29 LAB — MAGNESIUM: Magnesium: 1.6 mg/dL (ref 1.6–2.5)

## 2017-07-29 LAB — SEDIMENTATION RATE, AUTOMATED: Sedimentation Rate: 39 mm/hr — ABNORMAL HIGH (ref 0–30)

## 2017-07-29 LAB — GRAM STAIN: Gram Stain: 0

## 2017-07-29 LAB — PROTIME-INR
INR: 1.2 — ABNORMAL HIGH (ref 0.9–1.1)
Protime: 13 s — ABNORMAL HIGH (ref 10.0–12.9)

## 2017-07-29 LAB — PREALBUMIN: Prealbumin: 10 mg/dL — ABNORMAL LOW (ref 20–40)

## 2017-07-29 MED ORDER — PREGABALIN 75 MG PO CAPS *I*
75.0000 mg | ORAL_CAPSULE | Freq: Every day | ORAL | Status: DC
Start: 2017-07-29 — End: 2017-07-29
  Administered 2017-07-29: 75 mg via ORAL
  Filled 2017-07-29: qty 1

## 2017-07-29 MED ORDER — PREGABALIN 75 MG PO CAPS *I*
75.0000 mg | ORAL_CAPSULE | Freq: Two times a day (BID) | ORAL | Status: AC
Start: 2017-07-29 — End: 2017-08-05
  Administered 2017-07-29 – 2017-08-05 (×14): 75 mg via ORAL
  Filled 2017-07-29 (×14): qty 1

## 2017-07-29 NOTE — Progress Notes (Signed)
Nursing Progress Note:    Assumed care at 15:30. No acute events this shift. A&O x3, VSS. Pain controlled on current regimen. Pt assisted to wheelchair and taken outside by daughter from 15:45 until approximately 17:15. During that time, pt's daughter reported that the patient was able to eat a small amount of food that was brought from home. Pt was assisted with showering shortly after return to the unit. Pt tolerated well. Tube feed initiated as ordered at 17:55. Pt has tolerated well thus far with minimal residuals noted and no c/o nausea or vomiting. Pt with no issues or complaints at this time, currently resting in chair, call light within reach. Please refer to flowsheets for complete assessment findings. Report given to Doralee Albino, RN, will continue to monitor.    Ashley-Marie Trinity Hyland, RN

## 2017-07-29 NOTE — Progress Notes (Signed)
Palliative Care Progress Note    HPI: [name] w/ pancreatic CA s/p Whipple 1/25 (treatment for cure) w/ prolonged complicated hospitalization issues w/ infection, nutrition/appetite/nausea/constipation, s/p PEG w/ perf now resolved, most recent CT abd 5/16 showed no new fluid collections and decrease in existing hepatic collections. Pt resumed methadone on 5/8 and weaning off her dilaudid PCA 5/23. All drains now removed, receiving TFs at night to optimize appetite during day. Referrals for SNF rehab pending. Pt w/ signs of sepsis 5/24 & +BC w/ RLL consolidation on CT concerning for asp pna, abd CT w/o acute changes. Pt improved w/ broadening of abx.     Subjective: "I feel ok, I just took some pain medicine. I'm getting ready to go outside"    Objective: chart reviewed, pt improved since last saw on Friday, has cough - using flutter valve and  Incentive spirometer up to 1500 ml; pt in good spirits, states her shoulders "killing her" - ok to increase her lyrica dose to 150 mg as its been a week since initiation; dtr [name] and friend visiting - dtr advocating that pt be seen by OT to help w/ upper body strength exercises    Palliative Care ROS:  Pain   Mild  Nausea   Mild  Anorexia   Moderate  Anxiety   Mild  Shortness of Breath   Mild  Tiredness/Fatigue   Mild  Drowsiness/Sleepiness   Mild  Airway Secretions   yes  Constipation   no  Unable to Respond   no   Last stool 5/26    Physical Examination:   BP: (98-126)/(58-68)   Temp:  [36.3 C (97.3 F)-36.8 C (98.2 F)]   Temp src: Temporal (05/27 1120)  Heart Rate:  [66-100]   Resp:  [14-16]   SpO2:  [93 %-97 %]   Gen appearance: older Caucasian female, sitting up in [facility] waiting to go outside, states LUQ abd pain better after taking prn dilaudid, c/o increase DOE, still on NC and has cough  Lungs: easy resp, intermittent junky cough, was able to get sputum sample, 2 L NC   Heart: reg  Abd: large, soft, +BS, PEG (tolerating TFs at night, fair appetite)  Extrem:  edematous LEs  Skin:  dry and red LEs, flaky skin  Neuro: A+O x 3, fairly good spirits, participated in pulmonary toilet exercises w/ flutter valve and IS; pt ambulates short distances w/ walker, now more fatigued and needs O2 but overall improving    Assessment/Plan:  [name] w/ pancreatic CA s/p Whipple (treatment for cure) w/ prolonged hospitalization. Pt w/ clinical changes indicative of infection on 5/24 w/ [facility] notable for +BC E coli bactermia, RLL consolidation on chest CT concerning for possible asp pna. Pt improved w/ widened abx though still w/ cough and O2 requirement. Pt in agreement w/ increasing lyrica dose for her fibromyalgia, otherwise pain is controlled on current regimen.    Pain/dyspnea  Dilaudid 8 mg po Q 2hrs prn (x 3 on 5/26, x 4 on 5/27)   Lyrica 75 mg po daily for fibromyalgia - increase to BID (total 150 mg/d)  Methadone 7.07/12/08 mg po (total 27.5 mg/d) restarted 5/7, last increase 5/14 (ECG on 5/2 w/ QTc 392)  Albuterol neb Q 6 hrs prn (none used)  Lidocaine patch daily x 2 (abd, LUE)  Acetaminophen 650 mg po Q 6 hrs prn fever   Bengay cream 4x/d prn to joints    Anxiety/agitation/nausea/fatigue  Cymbalta 20 mg po/d  Metoclopramide  10 mg po TID AC  Ondansetron 4 mg IV Q 6 hrs ATC (started 5/16)  Ativan 0.25 mg IV Q 6 hrs prn (x 1 on 5/16)  Encouraged complementary adjuvant therapies for coping, meditation app, aromatherapy, gentle massage     Prevention of opioid induced constipation, last stool 5/26  Bisacodyl supp pr daily   Miralax 17 gm po daily  Senna 17.6 mg po BID    Dry flaky edematous Skin  Aquaphor prn   Eucerin prn for BLEs    GOC/prognosis  Full code  Dtr Rod Mae is HCP (very supportive and informed, former [facility] RN)  Goal currently is to prioritize pt's symptom control, long course though pt endorses agreement w/ continuing current treatment plan, dispo to SNF in hopes of eventually making recovery back to independent living    Pt case discussed w/ RN & covering  provider aware of recommendation to increase lyrica dose.  We will continue to follow along. Please call if questions/concerns.    Total Time Spent 35 minutes:             >50% of time was spent in counseling and/or coordination of care.     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    Anemia D64.9    Hypothyroidism E03.9    Pancreatic cancer C25.9    Anemia D64.9     hx of Pulmonary embolism I26.99    Acute pulmonary insufficiency J98.4    Depression F32.9    Sepsis A41.9       No Known Allergies (drug, envir, food or latex)    Scheduled Meds:    pregabalin  75 mg Oral Daily    ertapenem  1,000 mg Intravenous Q24H    sodium chloride  2 g Per G Tube TID PC    ondansetron  4 mg Oral Q6H    metoclopramide  10 mg Oral TID AC    levothyroxine  150 mcg Oral Daily @ 0600    enoxaparin  40 mg Subcutaneous Daily @ 2100    linezolid  600 mg Oral 2 times per day    methadone  7.5 mg Oral Daily    And    methadone  10 mg Oral BID    sennosides  17.6 mg Oral 2 times per day    PROSOURCE NO CARB  30 mL Oral Daily with breakfast    polyethylene glycol  17 g Oral Daily    lactobacillus rhamnosus (GG)  1 capsule Oral Daily    bisacodyl  10 mg Rectal Daily    DULoxetine  20 mg Oral Q24H    lidocaine  1 patch Transdermal Q24H    lidocaine  1 patch Transdermal Q24H       Continuous Infusions:    sodium chloride 3 mL/hr (07/28/17 2018)       PRN Meds:  HYDROmorphone, albuterol, methyl salicylate-menthol, naloxone, mineral oil-hydrophilic petrolatum, sodium chloride, sodium chloride, sodium chloride, dextrose

## 2017-07-29 NOTE — Plan of Care (Signed)
Problem: Safety  Goal: Patient will remain free of falls  Outcome: Maintaining      Problem: Pain/Comfort  Goal: Patient's pain or discomfort is manageable  Outcome: Progressing towards goal    Goal: Patient's pain or discomfort is manageable  Outcome: Progressing towards goal      Problem: Mobility  Goal: Functional status is maintained or improved - Geriatric  Outcome: Progressing towards goal      Problem: Nutrition  Goal: Patient's nutritional status is maintained or improved  Outcome: Progressing towards goal      Problem: Cognitive function  Goal: Cognitive function will be maintained  Outcome: Progressing towards goal    Goal: Cognitive function will be maintained or return to baseline  Outcome: Progressing towards goal      Problem: Post-Operative Hemodynamic Stability  Goal: Maintain Hemodynamic Stability  Outcome: Progressing towards goal      Problem: Post-Operative Complications  Goal: Prevent post-operative complications  Outcome: Progressing towards goal      Problem: Post-Operative Bowel Elimination  Goal: Elimination pattern is normal or improving  Outcome: Progressing towards goal      Problem: Fluid and Electrolyte Imbalance  Goal: Fluid and Electrolyte imbalance  Outcome: Progressing towards goal      Problem: GI Bleeding Elimination  Goal: Elimination of patterns are normal or improving  Outcome: Progressing towards goal      Problem: Post-Operative Bladder Elimination  Goal: Patient is able to empty bladder or return to baseline  Outcome: Progressing towards goal

## 2017-07-29 NOTE — Progress Notes (Addendum)
Surgical Oncology Surgery Progress Note    Patient: Amanda Galloway    LOS: 122 days    Attending: Ron Agee     INTERVAL HISTORY & SUBJECTIVE  NAE overnight. Patient reports wet cough but non-productive. Unable to obtain sputum culture and patient refusing NT suction. Working on pulmonary toilet. Denies any n/v, PWC. Afebrile and VSS.  WBC normal. Continues on IV ertapenem and PO linezolid.  Alk phos 1241 (750).       OBJECTIVE  Physical Exam:  Temp:  [36.3 C (97.3 F)-36.8 C (98.2 F)] 36.5 C (97.7 F)  Heart Rate:  [84-102] 99  Resp:  [16] 16  BP: (98-120)/(50-60) 120/60   GEN: no apparent distress  HEENT: NCAT, face symmetric  CHEST: nonlabored respirations on 2LNC  ABD: soft, mildly distended, minimally tender surrounding G-tube that is CDI, minimal erythema  NEURO/MOTOR: alert, appropriate  EXTREMITIES: atraumatic  VASCULAR: feet wwp, 1+ peripheral edema in BLE    Intake/Output:  05/26 0700 - 05/27 0659  In: 2124.7 [P.O.:800; I.V.:29.7]  Out: 500 [Urine:500]     Medications:  Current Facility-Administered Medications   Medication    pregabalin (LYRICA) capsule 75 mg    ertapenem (INVANZ) IV 1,000 mg    sodium chloride tablet 2 g    ondansetron (ZOFRAN) tablet 4 mg    metoclopramide (REGLAN) tablet 10 mg    HYDROmorphone (DILAUDID) tablet 8 mg    sodium chloride 0.9% IV    levothyroxine (SYNTHROID, LEVOTHROID) tablet 150 mcg    enoxaparin (LOVENOX) injection 40 mg    linezolid (ZYVOX) tablet 600 mg    pregabalin (LYRICA) capsule 75 mg    methadone (DOLOPHINE) tablet 7.5 mg    And    methadone (DOLOPHINE) tablet 10 mg    sennosides (SENOKOT) 8.8 MG/5ML syrup 17.6 mg    PROSOURCE NO CARB liquid LIQD 30 mL    albuterol (PROVENTIL) nebulization 2.5 mg    polyethylene glycol (GLYCOLAX,MIRALAX) powder 17 g    acetaminophen (TYLENOL) 650 MG/20.3 ML solution 650 mg    lactobacillus rhamnosus (GG) (CULTURELLE) capsule 1 each    methyl salicylate-menthol (BENGAY) topical cream    bisacodyl  (DULCOLAX) suppository 10 mg    DULoxetine (CYMBALTA) DR capsule 20 mg    naloxone (NARCAN) 0.4 mg/mL injection 0.1 mg    mineral oil-hydrophilic petrolatum (AQUAPHOR) ointment    sodium chloride 0.9 % flush 10 mL    sodium chloride 0.9 % flush 10 mL    sodium chloride 0.9 % FLUSH REQUIRED IF PATIENT HAS IV    dextrose 5 % FLUSH REQUIRED IF PATIENT HAS IV    lidocaine (LIDODERM) 5 % patch 1 patch    lidocaine (LIDODERM) 5 % patch 1 patch     Facility-Administered Medications Ordered in Other Encounters   Medication    etomidate (AMIDATE) 2 mg/mL injection    rocuronium (ZEMURON) 10 mg/mL injection         Laboratory values:   Recent Labs      07/29/17   0019  07/28/17   0025   WBC  5.4  5.8   Hemoglobin  7.1*  7.1*   Hematocrit  23*  23*   Platelets  234  243   INR  1.2*   --    Sedimentation Rate  39*   --    CRP  63*   --      No components found with this basename: APTT, PT Recent Labs  07/29/17   0019  07/28/17   0025   Sodium  135  134   Potassium  4.3  4.3   Chloride  96  94*   CO2  29*  30*   UN  11  10   Creatinine  0.62  0.65   Glucose  121*  104*   Calcium  8.7  8.7   Magnesium  1.6   --    Phosphorus  4.5   --     Recent Labs      07/29/17   0019  07/28/17   0025   AST  184*  84*   ALT  113*  101*   Alk Phos  1,241*  750*   Bilirubin,Total  0.3  0.2     Recent Labs      07/29/17   0019  07/28/17   0025   Total Protein  6.6  6.3   Albumin  2.8*  2.6*   Prealbumin  10*   --      No results for input(s): AMY, LIP in the last 72 hours.   GLUCOSE:   No results for input(s): PGLU in the last 72 hours.  Imaging: Ct Angio Chest    Result Date: 07/27/2017  1. No main, lobar or segmental pulmonary embolus. Limited assessment of the subsegmental pulmonary arteries. Enlarged pulmonary arteries, which can be seen in the clinical setting of pulmonary arterial hypertension. 2. New small right pleural effusion. Unchanged moderate left pleural effusion. 3. Progressive consolidative changes in the right lower  lobe with volume loss most likely represent atelectasis given progressive occlusion of the segmental right lower lobe bronchi, potentially from mucoid impaction. Underlying infection/aspiration is not entirely excluded given the fluid-filled patulous esophagus. 4. Interval development of ground glass opacities in the peripheral left upper lobe may represent infection or inflammation. 5. Dilated esophagus containing contrast. Please see separately dictated report of concurrent CT abdomen and pelvis for findings below the diaphragm. END OF IMPRESSION I have personally reviewed the images and the Resident's/Fellow's interpretation and agree with or edited the findings. [facility] submits this DICOM format image data and final report to the [facility], an independent secure electronic health information exchange, on a reciprocally searchable basis (with patient authorization) for a minimum of 12 months after exam date.    Ct Abdomen And Pelvis With Contrast    Result Date: 07/27/2017  Status post that procedure. Interval removal of the biliary drain. Stable intrahepatic fluid collection. Slight decrease in the peripheral hepatic fluid collection. END OF IMPRESSION [facility] submits this DICOM format image data and final report to the [facility], an independent secure electronic health information exchange, on a reciprocally searchable basis (with patient authorization) for a minimum of 12 months after exam date.    * Abdomen Standard Ap Single View/kub    Result Date: 07/26/2017  There is a single mildly dilated segment of large bowel visualized in the right mid abdomen. Paucity of small bowel gas limits assessment. Overall nonspecific bowel gas pattern. Gastrostomy tube projects over the left upper/mid abdomen. END OF IMPRESSION    Chest Single Frontal View    Result Date: 07/26/2017  Decrease in CHF. Small left pleural effusion END OF IMPRESSION [facility] submits this DICOM format image data and final report to  the [facility], an independent secure electronic health information exchange, on a reciprocally searchable basis (with patient authorization) for a minimum of 12 months after exam date.  ASSESSMENT  Tuyen Uncapher is a 72 y.o. female with h/o recent PE and pancreatic cancer POD # 102  status post whipple procedure complicated by delayed gastric emptying.  NGT replaced on 04/04/17. UGI on 04/12/17 concerning for obstruction just beyond the [place] in the efferent limb.  Course complicated on 1/79/19 by rising LFTs and sepsis with CT concerning for cholangitis given biliary tree obstruction and PV compression due to hematoma and afferent limb distention in setting of PE treatment. Is s/p IR PTC on 04/16/17 and IR percutaneous aspiration of anterior abdominal fluid collection on 04/24/17. IR LUQ perc drain placement 3/1. IR anterior abdominal 32f perc drain placement 3/3. IR anterior perc drain into left hepatic collection 3/6. Developed E. Coli bacteremia on 07/26/17 presumed secondary to aspiration pneumonia.    PLAN   Continue Antibiotics per ID, PO linezolid and IV ertapenem. Will follow blood culture sensitivities. Obtain sputum culture today. Appreciate recs   Continue regular diet and TFs at goal overnight   Vent G-tube PRN nausea  Monitor mild hyponatremia, continues on salt tabs for now.  Aggressive pulmonary toilet  Bowel regimen   Analgesia with PO pain. Appreciate  palliative care assistance   Heparin gtt stopped 5/20. Remains on ppx lovenox for now   OOB/ambulate, PT/OT   Dispo: SNF pending clinical improvment     Author: MReynolds Bowl MD as of: 07/29/2017  at: 7:42 AM       [facility] GI Surgery Attending for Dr. GRon Agee    I saw and evaluated the patient. I have reviewed and edited the resident's/fellow's note and confirm the findings and plan of care as documented above. Smiling, sitting in a chair denies pain. Possible pulmonary infection: pulm. Toilet. Abx per ID. Appears better than yesterday without  obvious abdominal problem. Defer CT for now.    LCharolotte Eke MD

## 2017-07-30 ENCOUNTER — Inpatient Hospital Stay: Payer: Medicare (Managed Care)

## 2017-07-30 LAB — CBC AND DIFFERENTIAL
Baso # K/uL: 0 10*3/uL (ref 0.0–0.1)
Basophil %: 0.5 %
Eos # K/uL: 0.6 10*3/uL — ABNORMAL HIGH (ref 0.0–0.4)
Eosinophil %: 7.8 %
Hematocrit: 25 % — ABNORMAL LOW (ref 34–45)
Hemoglobin: 7.6 g/dL — ABNORMAL LOW (ref 11.2–15.7)
IMM Granulocytes #: 0 10*3/uL
IMM Granulocytes: 0.3 %
Lymph # K/uL: 0.9 10*3/uL — ABNORMAL LOW (ref 1.2–3.7)
Lymphocyte %: 11.4 %
MCH: 29 pg/cell (ref 26–32)
MCHC: 31 g/dL — ABNORMAL LOW (ref 32–36)
MCV: 95 fL (ref 79–95)
Mono # K/uL: 0.9 10*3/uL (ref 0.2–0.9)
Monocyte %: 11.9 %
Neut # K/uL: 5.2 10*3/uL (ref 1.6–6.1)
Nucl RBC # K/uL: 0 10*3/uL (ref 0.0–0.0)
Nucl RBC %: 0 /100 WBC (ref 0.0–0.2)
Platelets: 285 10*3/uL (ref 160–370)
RBC: 2.6 MIL/uL — ABNORMAL LOW (ref 3.9–5.2)
RDW: 17.7 % — ABNORMAL HIGH (ref 11.7–14.4)
Seg Neut %: 68.1 %
WBC: 7.6 10*3/uL (ref 4.0–10.0)

## 2017-07-30 LAB — COMPREHENSIVE METABOLIC PANEL
ALT: 233 U/L — ABNORMAL HIGH (ref 0–35)
AST: 388 U/L — ABNORMAL HIGH (ref 0–35)
Albumin: 3.2 g/dL — ABNORMAL LOW (ref 3.5–5.2)
Alk Phos: 1509 U/L — ABNORMAL HIGH (ref 35–105)
Anion Gap: 12 (ref 7–16)
Bilirubin,Total: 0.2 mg/dL (ref 0.0–1.2)
CO2: 28 mmol/L (ref 20–28)
Calcium: 9 mg/dL (ref 8.6–10.2)
Chloride: 96 mmol/L (ref 96–108)
Creatinine: 0.63 mg/dL (ref 0.51–0.95)
GFR,Black: 104 *
GFR,Caucasian: 91 *
Glucose: 111 mg/dL — ABNORMAL HIGH (ref 60–99)
Lab: 12 mg/dL (ref 6–20)
Potassium: 4.7 mmol/L (ref 3.3–5.1)
Sodium: 136 mmol/L (ref 133–145)
Total Protein: 7.4 g/dL (ref 6.3–7.7)

## 2017-07-30 MED ORDER — METHADONE HCL 10 MG PO TABS *I*
10.0000 mg | ORAL_TABLET | Freq: Two times a day (BID) | ORAL | Status: AC
Start: 2017-07-30 — End: 2017-07-30
  Administered 2017-07-30: 10 mg via ORAL
  Filled 2017-07-30: qty 1

## 2017-07-30 MED ORDER — CEFAZOLIN 2000 MG IN STERILE WATER 20ML SYRINGE *I*
2000.0000 mg | PREFILLED_SYRINGE | Freq: Three times a day (TID) | INTRAVENOUS | Status: DC
Start: 2017-07-30 — End: 2017-07-31
  Administered 2017-07-30 – 2017-07-31 (×2): 2000 mg via INTRAVENOUS
  Filled 2017-07-30 (×7): qty 20

## 2017-07-30 MED ORDER — METHADONE HCL 5 MG PO TABS *I*
7.5000 mg | ORAL_TABLET | Freq: Every day | ORAL | Status: DC
Start: 2017-07-31 — End: 2017-07-31
  Administered 2017-07-31: 7.5 mg via ORAL
  Filled 2017-07-30: qty 2

## 2017-07-30 MED ORDER — METRONIDAZOLE IN NACL 5 MG/ML IV SOLUTION *WRAPPED*
500.0000 mg | Freq: Three times a day (TID) | INTRAVENOUS | Status: DC
Start: 2017-07-30 — End: 2017-07-31
  Administered 2017-07-30 – 2017-07-31 (×2): 500 mg via INTRAVENOUS
  Filled 2017-07-30 (×6): qty 100

## 2017-07-30 MED ORDER — GUAIFENESIN 600 MG PO TB12 *I*
600.0000 mg | ORAL_TABLET | Freq: Two times a day (BID) | ORAL | Status: DC
Start: 2017-07-30 — End: 2017-08-24
  Administered 2017-07-30 – 2017-08-23 (×40): 600 mg via ORAL
  Filled 2017-07-30 (×54): qty 1

## 2017-07-30 NOTE — Progress Notes (Signed)
Surgical Oncology Surgery Progress Note    Patient: Amanda Galloway    LOS: 123 days    Attending: Galka     INTERVAL HISTORY & SUBJECTIVE  NAE overnight, Afebrile and VSS.  Awake in chair and smiling this AM  Pain controlled   Tolerating PO and TFs at goal   WBC normal. Continues on IV ertapenem and PO linezolid.  Alk phos 1509 (1241).  AST/ALT 200-300s       OBJECTIVE  Physical Exam:  Temp:  [36.2 °C (97.2 °F)-36.9 °C (98.4 °F)] 36.2 °C (97.2 °F)  Heart Rate:  [66-100] 96  Resp:  [14-16] 16  BP: (108-132)/(54-68) 112/64   GEN: no apparent distress  HEENT: NCAT, face symmetric  CHEST: nonlabored respirations on 2LNC  ABD: soft, mildly distended, minimally tender surrounding G-tube that is CDI, minimal erythema  NEURO/MOTOR: alert, appropriate  EXTREMITIES: atraumatic  VASCULAR: feet wwp, 1+ peripheral edema in BLE    Intake/Output:  05/27 0700 - 05/28 0659  In: 1295 [P.O.:760; I.V.:20]  Out: 0      Medications:  Current Facility-Administered Medications   Medication   • pregabalin (LYRICA) capsule 75 mg   • ertapenem (INVANZ) IV 1,000 mg   • sodium chloride tablet 2 g   • ondansetron (ZOFRAN) tablet 4 mg   • metoclopramide (REGLAN) tablet 10 mg   • HYDROmorphone (DILAUDID) tablet 8 mg   • sodium chloride 0.9% IV   • levothyroxine (SYNTHROID, LEVOTHROID) tablet 150 mcg   • enoxaparin (LOVENOX) injection 40 mg   • linezolid (ZYVOX) tablet 600 mg   • methadone (DOLOPHINE) tablet 7.5 mg    And   • methadone (DOLOPHINE) tablet 10 mg   • sennosides (SENOKOT) 8.8 MG/5ML syrup 17.6 mg   • PROSOURCE NO CARB liquid LIQD 30 mL   • albuterol (PROVENTIL) nebulization 2.5 mg   • polyethylene glycol (GLYCOLAX,MIRALAX) powder 17 g   • lactobacillus rhamnosus (GG) (CULTURELLE) capsule 1 each   • methyl salicylate-menthol (BENGAY) topical cream   • bisacodyl (DULCOLAX) suppository 10 mg   • DULoxetine (CYMBALTA) DR capsule 20 mg   • naloxone (NARCAN) 0.4 mg/mL injection 0.1 mg   • mineral oil-hydrophilic petrolatum (AQUAPHOR)  ointment   • sodium chloride 0.9 % flush 10 mL   • sodium chloride 0.9 % flush 10 mL   • sodium chloride 0.9 % FLUSH REQUIRED IF PATIENT HAS IV   • dextrose 5 % FLUSH REQUIRED IF PATIENT HAS IV   • lidocaine (LIDODERM) 5 % patch 1 patch     Facility-Administered Medications Ordered in Other Encounters   Medication   • etomidate (AMIDATE) 2 mg/mL injection   • rocuronium (ZEMURON) 10 mg/mL injection         Laboratory values:   Recent Labs      07/30/17   0014  07/29/17   0019   WBC  7.6  5.4   Hemoglobin  7.6*  7.1*   Hematocrit  25*  23*   Platelets  285  234   INR   --   1.2*   Sedimentation Rate   --   39*   CRP   --   63*     No components found with this basename: APTT, PT Recent Labs      07/30/17   0014  07/29/17   0019   Sodium  136  135   Potassium  4.7  4.3   Chloride  96  96     96  96   CO2  28  29*   UN  12  11   Creatinine  0.63  0.62   Glucose  111*  121*   Calcium  9.0  8.7   Magnesium   --   1.6   Phosphorus   --   4.5    Recent Labs      07/30/17   0014  07/29/17   0019   AST  388*  184*   ALT  233*  113*   Alk Phos  1,509*  1,241*   Bilirubin,Total  0.2  0.3     Recent Labs      07/30/17   0014  07/29/17   0019   Total Protein  7.4  6.6   Albumin  3.2*  2.8*   Prealbumin   --   10*     No results for input(s): AMY, LIP in the last 72 hours.   GLUCOSE:   No results for input(s): PGLU in the last 72 hours.  Imaging: No results found.     ASSESSMENT  Amanda Galloway is a 72 y.o. female with h/o recent PE and pancreatic cancer POD # 102  status post whipple procedure complicated by delayed gastric emptying.  NGT replaced on 04/04/17. UGI on 04/12/17 concerning for obstruction just beyond the [place] in the efferent limb.  Course complicated on 07/20/96 by rising LFTs and sepsis with CT concerning for cholangitis given biliary tree obstruction and PV compression due to hematoma and afferent limb distention in setting of PE treatment. Is s/p IR PTC on 04/16/17 and IR percutaneous aspiration of anterior abdominal fluid  collection on 04/24/17. IR LUQ perc drain placement 3/1. IR anterior abdominal 50f perc drain placement 3/3. IR anterior perc drain into left hepatic collection 3/6. Developed E. Coli bacteremia on 07/26/17 presumed secondary to aspiration pneumonia.    PLAN   Continue Antibiotics per ID, PO linezolid until 5/31 and IV ertapenem. Ecoli pan sensitive Appreciate recs to further narrow   Continue regular diet and TFs at goal overnight   Vent G-tube PRN nausea  Hyponatremia since resolved   Aggressive pulmonary toilet  Bowel regimen   Analgesia with PO pain. Appreciate  palliative care assistance   Heparin gtt stopped 5/20. Remains on ppx lovenox for now   OOB/ambulate, PT/OT   Dispo: SNF pending clinical improvment     Author: ATonia Ghent MD as of: 07/30/2017  at: 6:22 AM

## 2017-07-30 NOTE — Progress Notes (Signed)
Palliative Care Progress Note    HPI: [name] w/ pancreatic CA s/p Whipple 1/25 (treatment for cure) w/ prolonged complicated hospitalization issues w/ infection, nutrition/appetite/nausea/constipation, s/p PEG w/ perf now resolved, most recent CT abd 5/16 showed no new fluid collections and decrease in existing hepatic collections. Pt resumed methadone on 5/8 and weaning off her dilaudid PCA 5/23. All drains now removed, receiving TFs at night to optimize appetite during day. Referrals for SNF rehab pending. Pt w/ signs of sepsis 5/24 & +BC w/ RLL consolidation on CT concerning for asp pna, abd CT w/o acute changes. Pt improved w/ broadening of abx.     Subjective: "It was lovely getting outside last night, I enjoyed our [employer] and the shower last evening but it was pretty exhausting. I still get the sharp pain in my upper left side (abd) especially when I'm up walking, the pain meds help but it usually subsides when I rest too"    Objective: chart reviewed, pt looking well this afternoon, well groomed w/ freshly pleated hair and painted nails with '[place] Red' nail polish; pt taking po dilaudid prn appropriately and using heat packs to LUQ abd pain intermittently; pt's spirits up and she asked to get OOB this am, walked around unit x 2; eating/drinking a bit better; spent time w/ pt/dtr providing reflective listening as [name]'s start date for work nears; pt has been improving physically -currently SNF rehab dispo pending    Palliative Care ROS:  Pain   Mild  Nausea   Mild  Anorexia   Moderate  Anxiety   Mild  Shortness of Breath   Mild  Tiredness/Fatigue   Mild  Drowsiness/Sleepiness   Mild  Airway Secretions   yes  Constipation   no  Unable to Respond   no   Last stool 5/28    Physical Examination:   BP: (108-132)/(54-68)   Temp:  [36.2 C (97.2 F)-36.9 C (98.4 F)]   Temp src: Temporal (05/28 1624)  Heart Rate:  [78-100]   Resp:  [16]   SpO2:  [95 %-97 %]   Gen appearance: older Caucasian  female, sitting up in bed, states LUQ abd pain better after taking prn dilaudid, continues w/ intermittent junky cough  Lungs: easy resp, intermittent junky cough, was able to get sputum sample, 2 L NC   Heart: reg  Abd: large, soft, +BS, PEG (tolerating TFs at night, fair appetite)  Extrem: edematous LEs  Skin:  dry and red LEs, flaky skin  Neuro: A+O x 3, fairly good spirits, participated in pulmonary toilet exercises w/ flutter valve and IS; pt ambulates short distances w/ walker, now more fatigued and needs O2 but overall improving    Assessment/Plan:  [name] w/ pancreatic CA s/p Whipple (treatment for cure) w/ prolonged hospitalization. Pt w/ clinical changes indicative of infection on 5/24 w/ [facility]U notable for +BC E coli bactermia, RLL consolidation on chest CT concerning for possible asp pna now improving on abx & pulm toilet. Overall, pt doing better and has been making progress towards dispo with SNF rehab pending.    Pain/dyspnea  Dilaudid 8 mg po Q 2hrs prn (x 6 on 5/27, x 5 on 5/28)   Lyrica 75 mg po BID for fibromyalgia (total 150 mg/d, increased 5/28)  Methadone 7.07/12/08 mg po (total 27.5 mg/d) restarted 5/7, last increase 5/14 (ECG on 5/2 w/ QTc 392)  Albuterol neb Q 6 hrs prn (none used)  Lidocaine patch daily x 2 (  abd, LUE)  Acetaminophen 650 mg po Q 6 hrs prn fever   Bengay cream 4x/d prn to joints  Consider adding guaifenesin 600 mg po BID expectorant    Anxiety/agitation/nausea/fatigue  Cymbalta 20 mg po/d  Metoclopramide 10 mg po TID AC  Ondansetron 4 mg po Q 6 hrs ATC (started 5/16)  Ativan 0.25 mg IV Q 6 hrs prn (x 1 on 5/16)  Encouraged complementary adjuvant therapies for coping, meditation app, aromatherapy, gentle massage     Prevention of opioid induced constipation, last stool 5/28  Bisacodyl supp pr daily   Miralax 17 gm po daily  Senna 17.6 mg po BID    Dry flaky edematous Skin  Aquaphor prn   Eucerin prn for BLEs    GOC/prognosis  Full code  Dtr Rod Mae is HCP (very  supportive and informed, former [facility] RN)  Goal currently is to prioritize pt's symptom control, long course though pt endorses agreement w/ continuing current treatment plan, dispo to SNF in hopes of eventually making recovery back to independent living    Pt case discussed w/ RN & covering provider aware of recommendation to consider adding mucinex.  We will continue to follow along. Please call if questions/concerns.    Total Time Spent 45 minutes:             >50% of time was spent in counseling and/or coordination of care.     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    Anemia D64.9    Hypothyroidism E03.9    Pancreatic cancer C25.9    Anemia D64.9     hx of Pulmonary embolism I26.99    Acute pulmonary insufficiency J98.4    Depression F32.9    Sepsis A41.9       No Known Allergies (drug, envir, food or latex)    Scheduled Meds:    pregabalin  75 mg Oral 2 times per day    ertapenem  1,000 mg Intravenous Q24H    sodium chloride  2 g Per G Tube TID PC    ondansetron  4 mg Oral Q6H    metoclopramide  10 mg Oral TID AC    levothyroxine  150 mcg Oral Daily @ 0600    enoxaparin  40 mg Subcutaneous Daily @ 2100    linezolid  600 mg Oral 2 times per day    methadone  7.5 mg Oral Daily    And    methadone  10 mg Oral BID    sennosides  17.6 mg Oral 2 times per day    PROSOURCE NO CARB  30 mL Oral Daily with breakfast    polyethylene glycol  17 g Oral Daily    lactobacillus rhamnosus (GG)  1 capsule Oral Daily    bisacodyl  10 mg Rectal Daily    DULoxetine  20 mg Oral Q24H    lidocaine  1 patch Transdermal Q24H       Continuous Infusions:    sodium chloride 3 mL/hr (07/28/17 2018)       PRN Meds:  HYDROmorphone, albuterol, methyl  salicylate-menthol, naloxone, mineral oil-hydrophilic petrolatum, sodium chloride, sodium chloride, sodium chloride, dextrose

## 2017-07-30 NOTE — Progress Notes (Signed)
Infectious Diseases Follow Up Note    Personally reviewed chart, labs, medications. Patient seen.     CC:  F/U VRE bacteremia, Liver and abdominal abscesses, s/p Enterobacter cloacae bacteremia, Intra-abdominal abscesses positive for VRE, enterobacter (last grew out 3/6), candida glabrata and candida albicans (both resistant to fluconazole)     Subjective:  Denies fevers/chills, still poor appetite but improving some as compared to weekend.  Endorsing frequent cough, wet, raising yellowish sputum, states cough is "terrible".  Noted to have wet sounding cough during visit.  No supplemental oxygen currently.  Denies SOB.  Abdominal exam improved, less pain/distention.  Daughter at bedside.       Current Meds:  Scheduled Meds:   pregabalin  75 mg Oral 2 times per day    ertapenem  1,000 mg Intravenous Q24H    sodium chloride  2 g Per G Tube TID PC    ondansetron  4 mg Oral Q6H    metoclopramide  10 mg Oral TID AC    levothyroxine  150 mcg Oral Daily @ 0600    enoxaparin  40 mg Subcutaneous Daily @ 2100    linezolid  600 mg Oral 2 times per day    methadone  7.5 mg Oral Daily    And    methadone  10 mg Oral BID    sennosides  17.6 mg Oral 2 times per day    PROSOURCE NO CARB  30 mL Oral Daily with breakfast    polyethylene glycol  17 g Oral Daily    lactobacillus rhamnosus (GG)  1 capsule Oral Daily    bisacodyl  10 mg Rectal Daily    DULoxetine  20 mg Oral Q24H    lidocaine  1 patch Transdermal Q24H     Continuous Infusions:   sodium chloride 3 mL/hr (07/28/17 2018)     PRN Meds:.   calcium carbonate  1,000 mg Oral BID PRN    HYDROmorphone PF  0.5 mg Intravenous Q1H PRN    Or    HYDROmorphone PF  1 mg Intravenous Q1H PRN    LORazepam  0.5 mg Oral Q6H PRN    heparin lock flush  50 Units Intracatheter PRN    ondansetron  4 mg Intravenous Q6H PRN     Objective:  BP: (108-132)/(54-68)   Temp:  [36.2 C (97.2 F)-36.9 C (98.4 F)]   Temp src: Temporal (05/28 0813)  Heart Rate:  [89-100]   Resp:   [14-16]   SpO2:  [95 %-97 %]     Physical Exam:              General Appearance: sitting in chair, appears brighter today, NAD on exam.   HEENT: Sclera anicteric, MMM, OP clear  Pulm: Crackles bibasilar, left >R, no wheezes,  WOB unlabored on RA.   CV: RRR, S1S2, no M/R/G, bilateral lower extremity edema  Abdomen:  Softly distended, grossly non-tender to palpation, though winced and cried out when gauze lifted from skin to visualized PEG site which appears benign.     Neuro: no gross deficits, answering questions appropriately  Lines: PICC LUE (4/11) benign       Recent Labs  Lab 07/30/17  0014 07/29/17  0019 07/28/17  0025   WBC 7.6 5.4 5.8   Hemoglobin 7.6* 7.1* 7.1*   Hematocrit 25* 23* 23*   Platelets 285 234 243   Seg Neut % 68.1 52.6 55.7   Lymphocyte % 11.4 18.7 16.3   Monocyte % 11.9 15.9  15.3   Eosinophil % 7.8 11.9 11.5       Recent Labs  Lab 07/30/17  0014 07/29/17  0019 07/28/17  0025   Sodium 136 135 134   Potassium 4.7 4.3 4.3   CO2 28 29* 30*   UN _0 Creatinine 0.63 0.62 0.65   Glucose 111* 121* 104*   Calcium 9.0 8.7 8.7         Lab results: 07/30/17  0014 07/29/17  0019 07/28/17  0025 07/27/17  0015 07/26/17  0410   Total Protein 7.4 6.6 6.3 6.4 6.7   Albumin 3.2* 2.8* 2.6* 2.7* 2.9*   ALT 233* 113* 101* 122* 141*   AST 388* 184* 84* 127* 152*   Alk Phos 1,509* 1,241* 750* 796* 942*   Bilirubin,Total 0.2 0.3 0.2 0.3 0.3       Lab Results  Component Value Date/Time   CRP 63 (H) 07/29/2017 0019   CRP 54 (H) 07/19/2017 0107   CRP 46 (H) 07/12/2017 0029       Lab Results  Component Value Date/Time   Sedimentation Rate 39 (H) 07/29/2017 0019   Sedimentation Rate 40 (H) 07/19/2017 0107   Sedimentation Rate >=130 (!) 07/12/2017 0029       Micro:   1/25: Intraoperative bile: GS 0 pmns, no organisms, Cx: no growth  2/12: 1 set of blood cultures from the right Mediport (no peripheral bld was sent): positive for Enterobacter cloacae complex in 70 hrs, sensitive to zosyn.   2/18 abdominal GS had >25  PMNs, many GPC in pairs/chains, many GNB and cultures 4+ Enterobacter cloacae complex. The cultures also grew 1+ Candida glabrata   2/21: BCx from the Right peripheral IV, Mediport, L IJ:  No growth  05/03/17: IR-guided I&D with drain placement of the left upper quadrant collection: GS > 25 PMNs, many GNB and GN diplococci, Cultures with 4+ Enterobacter cloacae complex (resistant to Zosyn and CTX), C. Albicans and C. Glabrata, and Candida tropicalis  05/04/17:    1 set of blood cultures from left IJ: VRE at 15.3 TTP. Daptomycin sensitivities: MIC 78mg/ml (dose dependent),  Linezolid   1 set from periphery: VRE and Enterobacter cloacae at 14.8 TTP   05/05/17:  IR-guided I&D with drain placement of the intrahepatic collection: GS 1-10 PMNs, very few GPC in pairs. Fungal cultures with Candida glabrata  05/05/17: Blood cultures from periphery, Mediport and IJ: no growth  05/04/17: Urine cultures obtained (for unclear reason): no growth  05/08/17: Abscess GS: >25 PMNs, many GPC in chains, few GNB. Aerobic cultures growing 2+ Enterobacter (ertapenem, Cipro, TMP/SMX-sensitive) and 3+VRE, Fungal: C. Glabrata (sens to caspo, vori and dose dependent sens to fluconazole)  06/06/17: 2 sets of BCx (1 set each from periphery and Mediport) - taken after restarting ertapenem: IVAD grew VRE ttp 18.2 hours,  Periphery (Left arm) grew VRE ttp 17.3.  06/07/17 Pleural fluid (left) GS 1-10 PMN's, 1-10 Nucleated white cells, No organisms seen and 1604 nucleated cells. No growth on culture.   06/07/17 Abdominal abscess: labs cancelled - no specimens received.   06/08/17: Mediport BC + for VRE in 23 hrs (R-Amp, R- tetracycline per lab, suppressed result, R- doxycycline;  S-Linezolid, S-Dapto dose dependent),  Right hand peripheral cx: No growth   06/10/17: Blood culture 2/2: 1 periphery (RHand), 1 from Mediport before removed: no growth  06/11/17: Blood Culture 2/2 (both periphery) - no growth   06/19/17:  Perigastric fluid #1:  GS 10-25 PMNs -  GPB, GNB, GPC in  pairs. 1+ C. Glabrata (R- fluconazole, voriconazole, S- Caspofungin); 2+ Enterococcus faecium (R- amp, PenG, Vanco; S- Linezolid)  06/19/17:  Liver fluid collection:  GS: 0 PMNs, Fungal and AFB stains negative, 1+ enterococcus faecium (R- Amp, PenG, Vanco, S- Dapto dose dependent, Linezolid)  06/19/17 : Perigastric fluid #2: GS: >25 PMNs, Gram negative bacilli; 3+ C. Glabrata.   07/04/17 U/A negative  07/04/17 Blood Cultures 3/3 (1 set PICC proximal port, 2 sets periphery): NGTD  07/04/17 MRSA amplification Nares: negative  07/05/17 Blood Culture 3/3 (2 PICC, 1 Periph) - NGTD  07/05/17 Liver Abscess (IR drain)- <1PMNs, GPC in prs and chains, 1 colony candida albicans (R- fluconazole, S-Caspo, vori)), 4+ Enterococcus faecium - two isolates with one now resistant to Daptomycin, the other dose dependent.   5/24: BCx 2 sets 1 peripheral 1 PICC. Peripheral grew E.coli after 13 hrs (Pansensitive).  UA no WBCS  5/26: BCx 2 sets 1 peripheral 1 PICC - NGTD preliminary  5/27 Sputum Gram Stain >10 squamous epithealial cells <25 PMNs - not cultured/contaminated    Imaging/Other Relevant Diagnostics:     Assessment and Plan:  ID problem(s):   - E.coli bacteremia: from intraabdominal collections vs. Aspiration pneumonia.  - VRE Bacteremia   - Multiple and extensive Intra-abdominal abscesses growing VRE, enterobacter and Candida glabrata, C.albicans  72 y.o. female with h/o recent PE and pancreatic cancer, s/p whipple procedure (goal of cure), complicated by delayed gastric emptying, elevated LFTs and sepsis, malnutrition 2/2 early satiety and anorexia (requiring PEG), and multiple intraabdominal and hepatic abscesses and drains. Most recent sepsis episode on 5/2 requiring ICU observation/fluids/increased O2 demands, no pressors required, stabilized quickly, source was not definitively identified, returned to [facility] floor on 5/7.  She has since slowly and steadily continued to improve.  The TPN weaned to off, she is tolerating TFs and taking in  small amounts of food from regular diet tray.  Her last abdominal drain was pulled today (07/22/17).  Ertapenem was discontinued on 07/18/17 (2/2 LFTs fluctuating upwards and she had not grown out gram negatives in 2 months ).  Palliative continues to assist with patient's pain control issues and Primary/Nutrition continue to assist pt with improved oral/protein intake.     Last Friday (5/24) developed some confusion, tachycarida, stopped eating, developed new cough. Blood cultures were obtained 2 sets from peripheral and PICC. Her peripheral stick grew E.coli after 13 hrs. CT of chest, abdomen, and pelvis with contrast done. Right lower lobe atelectasis/infection. Intraabdominal collections stable/slightly decreased.   Bacteremia source could be intraabdominal or possibly an aspiration pneumonia.     Currently Amanda Galloway is looking improved, abdominal exam benign, she does have a wet cough and bibasilar crackles noting at time of exam L>R.  Her LFTs continue to rise.  Lekeisha has now received 4 days broad coverage with meropenem/ertapenem. Plan to narrow discussed with ID PharmD S. Shulder and ID attending, Dr. William Hamburger.  Recommend stopping ertapenem and change to cefazolin 2 grams every 8 hours for 10 days to complete 14 days effective treatment  for E.coli bacteremia.  Will also add metronidazole (either PO or IV depending on what patient will best tolerate) for 1-3 days to complete anaerobe coverage for 5-7 days total, duration of 1-3 days to be determined by clinical progress.   Given events of this past weekend, will likely continue Linezolid out a bit further than previously planned end of this week.    Recommendations:    Discontinue Ertapenem  Start Cefazolin IV 2 grams every 8 hours for 10 days (E.coli bacteremia)   Start Flagyl 500 mg IV or PO for 1-3 days duration depending on clinical progress (to cover anaerobes in case of aspiration PNA)   Continue Linezolid 600 mg orally twice daily for VRE, duration TBD.     Follow daily CMP for now, until liver chemistries down trend   Follow CBC with differential weekly or more often per discretion of primary team   ID team 3 will continue to follow.    Patient seen with and case/plan discussed with ID attending, Dr. Lynita Lombard.     Lonzo Candy, ANP-BC  Infectious Diseases, Team 3  Pager [phone]  Office Ph [phone]

## 2017-07-31 ENCOUNTER — Inpatient Hospital Stay: Payer: Medicare (Managed Care)

## 2017-07-31 ENCOUNTER — Inpatient Hospital Stay: Payer: Medicare (Managed Care) | Admitting: Radiology

## 2017-07-31 DIAGNOSIS — J690 Pneumonitis due to inhalation of food and vomit: Secondary | ICD-10-CM

## 2017-07-31 LAB — CBC AND DIFFERENTIAL
Baso # K/uL: 0 10*3/uL (ref 0.0–0.1)
Baso # K/uL: 0.1 10*3/uL (ref 0.0–0.1)
Basophil %: 0.4 %
Basophil %: 0.5 %
Eos # K/uL: 0.2 10*3/uL (ref 0.0–0.4)
Eos # K/uL: 0.8 10*3/uL — ABNORMAL HIGH (ref 0.0–0.4)
Eosinophil %: 1.5 %
Eosinophil %: 11.7 %
Hematocrit: 23 % — ABNORMAL LOW (ref 34–45)
Hematocrit: 26 % — ABNORMAL LOW (ref 34–45)
Hemoglobin: 7.2 g/dL — ABNORMAL LOW (ref 11.2–15.7)
Hemoglobin: 8.3 g/dL — ABNORMAL LOW (ref 11.2–15.7)
IMM Granulocytes #: 0 10*3/uL
IMM Granulocytes #: 0.1 10*3/uL
IMM Granulocytes: 0.4 %
IMM Granulocytes: 0.5 %
Lymph # K/uL: 0.5 10*3/uL — ABNORMAL LOW (ref 1.2–3.7)
Lymph # K/uL: 0.9 10*3/uL — ABNORMAL LOW (ref 1.2–3.7)
Lymphocyte %: 14.5 %
Lymphocyte %: 3.8 %
MCH: 29 pg/cell (ref 26–32)
MCH: 30 pg/cell (ref 26–32)
MCHC: 31 g/dL — ABNORMAL LOW (ref 32–36)
MCHC: 32 g/dL (ref 32–36)
MCV: 92 fL (ref 79–95)
MCV: 95 fL (ref 79–95)
Mono # K/uL: 0.7 10*3/uL (ref 0.2–0.9)
Mono # K/uL: 0.9 10*3/uL (ref 0.2–0.9)
Monocyte %: 10.9 %
Monocyte %: 7.1 %
Neut # K/uL: 11.1 10*3/uL — ABNORMAL HIGH (ref 1.6–6.1)
Neut # K/uL: 4 10*3/uL (ref 1.6–6.1)
Nucl RBC # K/uL: 0 10*3/uL (ref 0.0–0.0)
Nucl RBC # K/uL: 0 10*3/uL (ref 0.0–0.0)
Nucl RBC %: 0 /100 WBC (ref 0.0–0.2)
Nucl RBC %: 0 /100 WBC (ref 0.0–0.2)
Platelets: 243 10*3/uL (ref 160–370)
Platelets: 295 10*3/uL (ref 160–370)
RBC: 2.5 MIL/uL — ABNORMAL LOW (ref 3.9–5.2)
RBC: 2.8 MIL/uL — ABNORMAL LOW (ref 3.9–5.2)
RDW: 18 % — ABNORMAL HIGH (ref 11.7–14.4)
RDW: 18.2 % — ABNORMAL HIGH (ref 11.7–14.4)
Seg Neut %: 61.9 %
Seg Neut %: 86.8 %
WBC: 12.7 10*3/uL — ABNORMAL HIGH (ref 4.0–10.0)
WBC: 6.4 10*3/uL (ref 4.0–10.0)

## 2017-07-31 LAB — PHOSPHORUS: Phosphorus: 4.9 mg/dL — ABNORMAL HIGH (ref 2.7–4.5)

## 2017-07-31 LAB — EKG 12-LEAD
P: 50 deg
PR: 184 ms
QRS: 37 deg
QRSD: 68 ms
QT: 324 ms
QTc: 424 ms
Rate: 103 {beats}/min
T: 44 deg

## 2017-07-31 LAB — URINALYSIS REFLEX TO CULTURE
Blood,UA: NEGATIVE
Ketones, UA: NEGATIVE
Leuk Esterase,UA: NEGATIVE
Nitrite,UA: NEGATIVE
Protein,UA: NEGATIVE mg/dL
Specific Gravity,UA: 1.015 (ref 1.002–1.030)
pH,UA: 8 (ref 5.0–8.0)

## 2017-07-31 LAB — COMPREHENSIVE METABOLIC PANEL
ALT: 277 U/L — ABNORMAL HIGH (ref 0–35)
AST: 543 U/L — ABNORMAL HIGH (ref 0–35)
Albumin: 2.8 g/dL — ABNORMAL LOW (ref 3.5–5.2)
Alk Phos: 1662 U/L — ABNORMAL HIGH (ref 35–105)
Anion Gap: 11 (ref 7–16)
Bilirubin,Total: 0.7 mg/dL (ref 0.0–1.2)
CO2: 27 mmol/L (ref 20–28)
Calcium: 8.6 mg/dL (ref 8.6–10.2)
Chloride: 96 mmol/L (ref 96–108)
Creatinine: 0.62 mg/dL (ref 0.51–0.95)
GFR,Black: 105 *
GFR,Caucasian: 91 *
Glucose: 124 mg/dL — ABNORMAL HIGH (ref 60–99)
Lab: 14 mg/dL (ref 6–20)
Potassium: 4.6 mmol/L (ref 3.3–5.1)
Sodium: 134 mmol/L (ref 133–145)
Total Protein: 6.7 g/dL (ref 6.3–7.7)

## 2017-07-31 LAB — LACTATE, PLASMA: Lactate: 1.2 mmol/L (ref 0.5–2.2)

## 2017-07-31 LAB — MAGNESIUM: Magnesium: 1.8 mg/dL (ref 1.6–2.5)

## 2017-07-31 LAB — AEROBIC CULTURE

## 2017-07-31 LAB — CLOSTRIDIUM DIFFICILE EIA: C difficile Toxins A and B EIA: 0

## 2017-07-31 MED ORDER — PIPERACILLIN/TAZOBACTAM 4.5 G IN NS MINI-BAG PLUS 110 ML *I*
4.5000 g | Freq: Four times a day (QID) | INTRAVENOUS | Status: DC
Start: 2017-07-31 — End: 2017-08-06
  Administered 2017-07-31 – 2017-08-06 (×25): 4.5 g via INTRAVENOUS
  Filled 2017-07-31 (×28): qty 110

## 2017-07-31 MED ORDER — HYDROMORPHONE HCL 2 MG/ML IJ SOLN *WRAPPED*
1.0000 mg | INTRAMUSCULAR | Status: AC | PRN
Start: 2017-07-31 — End: 2017-08-07
  Administered 2017-08-02 – 2017-08-06 (×4): 1 mg via INTRAVENOUS
  Filled 2017-07-31 (×5): qty 1

## 2017-07-31 MED ORDER — METHADONE HCL 5 MG PO TABS *I*
7.5000 mg | ORAL_TABLET | Freq: Every day | ORAL | Status: DC
Start: 2017-08-01 — End: 2017-07-31

## 2017-07-31 MED ORDER — ONDANSETRON HCL 2 MG/ML IV SOLN *I*
4.0000 mg | Freq: Four times a day (QID) | INTRAMUSCULAR | Status: DC | PRN
Start: 2017-07-31 — End: 2017-08-20
  Administered 2017-07-31 – 2017-08-17 (×16): 4 mg via INTRAVENOUS
  Filled 2017-07-31 (×16): qty 2

## 2017-07-31 MED ORDER — METHADONE HCL 10 MG PO TABS *I*
10.0000 mg | ORAL_TABLET | Freq: Every day | ORAL | Status: DC
Start: 2017-08-01 — End: 2017-07-31

## 2017-07-31 MED ORDER — METHADONE HCL 10 MG PO TABS *I*
10.0000 mg | ORAL_TABLET | Freq: Every day | ORAL | Status: DC
Start: 2017-07-31 — End: 2017-08-01
  Administered 2017-07-31: 10 mg via ORAL
  Filled 2017-07-31: qty 1

## 2017-07-31 MED ORDER — HYDROMORPHONE HCL 2 MG/ML IJ SOLN *WRAPPED*
0.5000 mg | INTRAMUSCULAR | Status: AC | PRN
Start: 2017-07-31 — End: 2017-08-07

## 2017-07-31 MED ORDER — PIPERACILLIN/TAZOBACTAM 3.375 G IN NS MINI-BAG PLUS 110 ML *I*
3.3750 g | Freq: Four times a day (QID) | INTRAVENOUS | Status: DC
Start: 2017-07-31 — End: 2017-07-31

## 2017-07-31 MED ORDER — METHADONE HCL 10 MG PO TABS *I*
10.0000 mg | ORAL_TABLET | Freq: Two times a day (BID) | ORAL | Status: DC
Start: 2017-08-01 — End: 2017-07-31

## 2017-07-31 MED ORDER — ALTEPLASE 2 MG IJ SOLR *I*
0.5000 mg | INTRAMUSCULAR | Status: DC | PRN
Start: 2017-07-31 — End: 2017-08-06
  Administered 2017-07-31: 2 mg
  Filled 2017-07-31 (×8): qty 2

## 2017-07-31 MED ORDER — IBUPROFEN 20 MG/ML PO SUSP *I*
600.0000 mg | Freq: Once | ORAL | Status: AC
Start: 2017-07-31 — End: 2017-07-31
  Administered 2017-07-31: 600 mg via ORAL
  Filled 2017-07-31: qty 30

## 2017-07-31 MED ORDER — IBUPROFEN 20 MG/ML PO SUSP *I*
600.0000 mg | Freq: Once | ORAL | Status: AC
Start: 2017-07-31 — End: 2017-07-31
  Administered 2017-07-31: 600 mg via GASTROSTOMY
  Filled 2017-07-31: qty 30

## 2017-07-31 MED ORDER — METHADONE HCL 10 MG PO TABS *I*
10.0000 mg | ORAL_TABLET | Freq: Two times a day (BID) | ORAL | Status: DC
Start: 2017-07-31 — End: 2017-07-31

## 2017-07-31 NOTE — Consults (Signed)
Medical Nutrition Therapy - Follow Up    Admit Date: 03/29/2017    Patient Summary: 72 y.o. femalewith h/o recent PE and pancreatic cancer status post whipple procedure complicated by delayed gastric emptying. NGT replaced on 04/04/17. UGI on 04/12/17 concerning for obstruction just beyond the [place] in the efferent limb. Course complicated on 2/44/01 by rising LFTs and sepsis with CT concerning for cholangitis given biliary tree obstruction and PV compression due to hematoma and afferent limb distention in setting of PE treatment. Is s/p IR PTC on 04/16/17 and IR percutaneous aspiration of anterior abdominal fluid collection on 04/24/17. IR LUQ perc drain placement 3/1. IR anterior abdominal 72f perc drain placement 3/3. IR anterior perc drain into left hepatic collection 3/6.  Pt w/ signs of sepsis 5/24 & +BC w/ RLL consolidation on CT concerning for asp pna, abd CT w/o acute changes. Pt improved w/ broadening of abx.     Pertinent Meds: reviewed   Scheduled Meds:   piperacillin-tazobactam  4.5 g Intravenous Q6H    sodium chloride  2 g Per G Tube TID PC    metoclopramide  10 mg Oral TID AC    levothyroxine  150 mcg Oral Daily @ 0600    sennosides  17.6 mg Oral 2 times per day    PROSOURCE NO CARB  30 mL Oral Daily with breakfast    polyethylene glycol  17 g Oral Daily    lactobacillus rhamnosus (GG)  1 capsule Oral Daily    bisacodyl  10 mg Rectal Daily   Continuous Infusions:   sodium chloride 50 mL/hr (07/31/17 1009)   PRN Meds:.ondansetron, HYDROmorphone, naloxone    Pertinent Labs: reviewed   PO4 4.9 (H), other lytes WNL  Elevated ALT, AST and Alk Phos (trending upward)  Low HGB and HCT  BG's ranging from 97-127    Reviewed I/O's  6x unmeasured stool output yesterday- no stool output recorded today  4x unmeasured UOP thus far today  350 ml emesis output    Enteral or parenteral access: PEG tube, PICC double lumen    Food allergies: NKFA    Current diet: regular  TF order: TwoCal HN @ 65 ml/hr x 12 hours.  Flush with 30cc NS every 4 hours.  Supplements: ProSource 30 mL 1 x daily    Nutrition Focused Physical Exam:    Edema: +2 BLE, generalized trace edema- Per RN shift assessment  Abdomen: hypoactive BS, loss of appetite, nausea, +flatus- per RN shift assessment  Skin: erythema, redness- per RN shift assessment     Anthropometrics:  Height: 170.2 cm (_0 )    Current Weight: 85.1 kg (187 lb 11.2 oz) (5/12); 118% IBW  Ideal Body Weight: 72.4 kg + 10%  BMI: 29.4 kg/(m^2) overweight with edema noted   Weight Hx: No new weight since 5/12.      Estimated Nutrient Needs: (Based on low weight of admission, 64.9 kg; close to being a dry weight)                1625-1950 kcal/day (25-30 kcal/kg)              98-130 g protein/day (1.3-1.8 g/kg)               1 liter fluid restriction     Nutrition Assessment and Diagnosis:   Last weight on patient taken 5/12. Please obtain updated current weight to accruately assess weight changes. Gastric residuals ranging from 0-5 ml. Per MD note, patient tolerating PO and  TF's at goal. TFs stopped last night for leaking at G tube site. Spoke with patient and daughter who reports that IR will need to look at PEG tube site before resuming feeds. Feeds may be held overnight. Per patients daughter, TFs have been going very well. Patient has not had any nausea and has had more of an appetite during the day since starting TwoCal HN. Daughter reports patient has been spiking fevers today so she has not been able to eat much. Per RN flowsheets patient eating ~ 25-50% of meals.    Current TF regimen: TwoCal HN @ 65 ml/hr x 12 hours. Flush with 30cc NS every 4 hours provides 1560 kcal, 65 g protein and 726 ml free water daily.     Malnutrition Status: Pt diagnosed with moderate malnutrition 04/09/17.      Nutrition Intervention:   1. Continue current TF as ordered with FWFs. May adjust FWF based on patients fluid status.   2. Continue ProSource 30 ml daily   3. Continue regular diet as  ordered  4. Continue to monitor: lytes and replete prn, % meal intakes in flowsheets, weekly weights    Nutrition Monitoring/Evaluation:   1. Will monitor diet tolerance and intake, nutrition-related labs, weight trend, BM pattern, supplement acceptance.   2. Will monitor TF tolerance, nutrition-related labs, weight trend, BM pattern.   3. Will follow up per high nutrition risk protocol.    Currie Paris   Dietetic Intern   Pager [phone]

## 2017-07-31 NOTE — Progress Notes (Signed)
Palliative Care Progress Note    HPI: [name] w/ pancreatic CA s/p Whipple 1/25 (treatment for cure) w/ prolonged complicated hospitalization issues w/ infection, nutrition/appetite/nausea/constipation, s/p PEG w/ perf now resolved, most recent CT abd 5/16 showed no new fluid collections and decrease in existing hepatic collections. Pt resumed methadone on 5/8 and weaning off her dilaudid PCA 5/23. All drains now removed, receiving TFs at night to optimize appetite during day. Referrals for SNF rehab pending. Pt w/ signs of sepsis 5/24 & +BC w/ RLL consolidation on CT concerning for asp pna, abd CT w/o acute changes. Pt had improved w/ broadening of abx then developed fever 5/28 requiring addit WU and abx switches.    Subjective: "I don't know this keeps happening to me. I want this to be over, I want to get out of here more than anyone"    Objective: chart reviewed, unfortunately pt w/ set back again w/ fever, tachycardia and tachypnea last eve, abx broadened, cultures pending; dtr [name] asking about Echo to chk for vegetation on valves - last Echo was in Feb; CXR reveals worsening atelectasis/opacity on LLL, known L pl effusion; pt back down to 2 L NC and HR wnl when visited this afternoon, pt reporting stiff and sore and feeling exhausted, no appetite; pt's spirits fairly good & did engage in humor and entertained watching Youtube clip of "Make Em Laugh" from movie [place]; very much appreciative of incredible [facility] staff support and comraderie    Palliative Care ROS:  Pain   Mild  Nausea   Mild  Anorexia   Severe  Anxiety   Moderate  Shortness of Breath   Moderate  Tiredness/Fatigue   Moderate  Drowsiness/Sleepiness   Mild  Airway Secretions   yes  Constipation   no  Unable to Respond   no   Last stool 5/28 (Cdiff ordered so precautions ordered automatically though pt not having any stool today)    Physical Examination:   BP: (100-150)/(50-66)   Temp:  [36.2 C (97.2 F)-38.6 C (101.4 F)]   Temp  src: Temporal (05/29 1912)  Heart Rate:  [82-127]   Resp:  [16-40]   SpO2:  [85 %-98 %]   Gen appearance: older Caucasian female, sitting up in recliner, states feels exhausted and achy all over, using prn dilaudid, continues w/ intermittent junky cough  Lungs: mild SOB at rest, intermittent junky cough, 2 L NC   Heart: reg  Abd: large, soft, +BS, PEG leaking (awaiting eval by IR)  Extrem: edematous LEs  Skin:  dry and red LEs, flaky skin  Neuro: A+O x 3, fairly good spirits in spite of recent set back w/ fever and leaking PEG, at baseline is able to ambulate w/ walker & standby assist    Assessment/Plan:  [name] w/ pancreatic CA s/p Whipple (treatment for cure) w/ prolonged hospitalization. Pt w/ clinical changes indicative of infection on 5/24 & new fever 5/28. Now w/ TFs on hold awaiting IR to check leaking PEG and requiring IV abx. In general, pt has been making progress towards dispo with SNF rehab.    Pain/dyspnea  Dilaudid 8 mg po Q 2hrs prn (x 6 on 5/28, x 2 on 5/29)   Lyrica 75 mg po BID for fibromyalgia (total 150 mg/d, increased 5/28)  Methadone 7.07/12/08 mg po (total 27.5 mg/d) restarted 5/7, last increase 5/14 (ECG on 5/2 w/ QTc 392)  Albuterol neb Q 6 hrs prn (none used)  Lidocaine patch daily  x 2 (abd, LUE)  Acetaminophen 650 mg po Q 6 hrs prn fever   Bengay cream 4x/d prn to joints  Guaifenesin 600 mg po BID expectorant    Anxiety/agitation/nausea/fatigue  Cymbalta 20 mg po/d  Metoclopramide 10 mg po TID AC  Ondansetron 4 mg po Q 6 hrs ATC (started 5/16)  Ativan 0.25 mg IV Q 6 hrs prn (x 1 on 5/16)  Encouraged complementary adjuvant therapies for coping, meditation app, aromatherapy, gentle massage     Prevention of opioid induced constipation, last stool 5/28  Bisacodyl supp pr daily   Miralax 17 gm po daily  Senna 17.6 mg po BID    Dry flaky edematous Skin  Aquaphor prn   Eucerin prn for BLEs    GOC/prognosis  Full code  Dtr Rod Mae is HCP (very supportive and informed, former [facility]  RN)  Goal currently is to prioritize pt's symptom control, long course though pt endorses agreement w/ continuing current treatment plan, dispo to SNF in hopes of eventually making recovery back to independent living    Pt case discussed w/ RN. We will continue to follow along. Please call if questions/concerns.    Total Time Spent 35 minutes:             >50% of time was spent in counseling and/or coordination of care.     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    Anemia D64.9    Hypothyroidism E03.9    Pancreatic cancer C25.9    Anemia D64.9     hx of Pulmonary embolism I26.99    Acute pulmonary insufficiency J98.4    Depression F32.9    Sepsis A41.9       No Known Allergies (drug, envir, food or latex)    Scheduled Meds:    piperacillin-tazobactam  4.5 g Intravenous Q6H    methadone  10 mg Oral BID    And    [START ON 08/01/2017] methadone  7.5 mg Oral Daily    guaiFENesin  600 mg Oral 2 times per day    pregabalin  75 mg Oral 2 times per day    sodium chloride  2 g Per G Tube TID PC    metoclopramide  10 mg Oral TID AC    levothyroxine  150 mcg Oral Daily @ 0600    enoxaparin  40 mg Subcutaneous Daily @ 2100    linezolid  600 mg Oral 2 times per day    sennosides  17.6 mg Oral 2 times per day    PROSOURCE NO CARB  30 mL Oral Daily with breakfast    polyethylene glycol  17 g Oral Daily    lactobacillus rhamnosus (GG)  1 capsule Oral Daily    bisacodyl  10 mg Rectal Daily    DULoxetine  20 mg Oral Q24H    lidocaine  1 patch Transdermal Q24H       Continuous Infusions:    sodium chloride 50 mL/hr (07/31/17 1606)       PRN Meds:  ondansetron, alteplase, HYDROmorphone PF **OR** HYDROmorphone PF, HYDROmorphone, albuterol,  methyl salicylate-menthol, naloxone, mineral oil-hydrophilic petrolatum, sodium chloride, sodium chloride, sodium chloride, dextrose

## 2017-07-31 NOTE — Plan of Care (Signed)
Problem: Safety  Goal: Patient will remain free of falls  Outcome: Therapy STG met, progressing toward LTG      Problem: Pain/Comfort  Goal: Patient's pain or discomfort is manageable  Outcome: Maintaining    Goal: Patient's pain or discomfort is manageable  Outcome: Maintaining      Problem: Mobility  Goal: Functional status is maintained or improved - Geriatric  Outcome: Maintaining    Goal: Patient's functional status is maintained or improved  Outcome: Maintaining      Problem: Nutrition  Goal: Patient's nutritional status is maintained or improved  Outcome: Maintaining    Goal: Nutritional status is maintained or improved - Geriatric  Outcome: Maintaining      Problem: Cognitive function  Goal: Cognitive function will be maintained  Outcome: Maintaining    Goal: Cognitive function will be maintained or return to baseline  Outcome: Maintaining      Problem: Bowel Elimination  Goal: Elimination patterns are normal or improving  Outcome: Maintaining      Problem: Post-Operative Hemodynamic Stability  Goal: Maintain Hemodynamic Stability  Outcome: Maintaining      Problem: Post-Operative Complications  Goal: Prevent post-operative complications  Outcome: Maintaining      Problem: Post-Operative Bowel Elimination  Goal: Elimination pattern is normal or improving  Outcome: Maintaining      Problem: Psychosocial  Goal: Demonstrates ability to cope with illness  Outcome: Maintaining      Problem: Fluid and Electrolyte Imbalance  Goal: Fluid and Electrolyte imbalance  Outcome: Maintaining      Problem: GI Bleeding Elimination  Goal: Elimination of patterns are normal or improving  Outcome: Maintaining      Problem: Post-Operative Bladder Elimination  Goal: Patient is able to empty bladder or return to baseline  Outcome: Maintaining

## 2017-07-31 NOTE — Progress Notes (Addendum)
Surgical Oncology Surgery Progress Note    Patient: Amanda Galloway    LOS: 124 days    Attending: Ron Agee     INTERVAL HISTORY & SUBJECTIVE  NAE overnight, Afebrile and VSS.  Awake in bed, more confused this AM; AAOx2  Pain controlled, endorses pain with coughing   Tolerating PO and TFs at goal; TFs stopped overnight for leaking at G tube site   WBC normal. Abx switched to IV cefazolin, flagly, remains on linezolid   CXR yesterday demonstrated hypoexpanded lungs, atelectasis, left pleural effusion   Alk phos 1662 (1509)  ALT 277 (233)  AST 543 (388)      OBJECTIVE  Physical Exam:  Temp:  [36.2 C (97.2 F)-36.3 C (97.3 F)] 36.2 C (97.2 F)  Heart Rate:  [78-89] 82  Resp:  [16-18] 18  BP: (108-120)/(58-68) 114/58   GEN: no apparent distress  HEENT: NCAT, face symmetric  CHEST: nonlabored respirations on 2LNC  ABD: soft, mildly distended, minimally tender surrounding G-tube that is CDI, minimal erythema  NEURO/MOTOR: alert, appropriate  EXTREMITIES: atraumatic  VASCULAR: feet wwp, 1+ peripheral edema in BLE    Intake/Output:  05/28 0700 - 05/29 0659  In: 1410.7 [P.O.:610]  Out: 0      Medications:  Current Facility-Administered Medications   Medication    guaiFENesin (MUCINEX) 12 hr tablet 600 mg    ceFAZolin (ANCEF) syringe 2,000 mg    metroNIDAZOLE (FLAGYL) IVBP 500 mg    methadone (DOLOPHINE) tablet 7.5 mg    pregabalin (LYRICA) capsule 75 mg    sodium chloride tablet 2 g    ondansetron (ZOFRAN) tablet 4 mg    metoclopramide (REGLAN) tablet 10 mg    HYDROmorphone (DILAUDID) tablet 8 mg    sodium chloride 0.9% IV    levothyroxine (SYNTHROID, LEVOTHROID) tablet 150 mcg    enoxaparin (LOVENOX) injection 40 mg    linezolid (ZYVOX) tablet 600 mg    sennosides (SENOKOT) 8.8 MG/5ML syrup 17.6 mg    PROSOURCE NO CARB liquid LIQD 30 mL    albuterol (PROVENTIL) nebulization 2.5 mg    polyethylene glycol (GLYCOLAX,MIRALAX) powder 17 g    lactobacillus rhamnosus (GG) (CULTURELLE) capsule 1 each    methyl  salicylate-menthol (BENGAY) topical cream    bisacodyl (DULCOLAX) suppository 10 mg    DULoxetine (CYMBALTA) DR capsule 20 mg    naloxone (NARCAN) 0.4 mg/mL injection 0.1 mg    mineral oil-hydrophilic petrolatum (AQUAPHOR) ointment    sodium chloride 0.9 % flush 10 mL    sodium chloride 0.9 % flush 10 mL    sodium chloride 0.9 % FLUSH REQUIRED IF PATIENT HAS IV    dextrose 5 % FLUSH REQUIRED IF PATIENT HAS IV    lidocaine (LIDODERM) 5 % patch 1 patch     Facility-Administered Medications Ordered in Other Encounters   Medication    etomidate (AMIDATE) 2 mg/mL injection    rocuronium (ZEMURON) 10 mg/mL injection         Laboratory values:   Recent Labs      07/31/17   0014  07/30/17   0014  07/29/17   0019   WBC  6.4  7.6  5.4   Hemoglobin  7.2*  7.6*  7.1*   Hematocrit  23*  25*  23*   Platelets  243  285  234   INR   --    --   1.2*   Sedimentation Rate   --    --   39*  CRP   --    --   63*     No components found with this basename: APTT, PT Recent Labs      07/31/17   0014  07/30/17   0014  07/29/17   0019   Sodium  134  136  135   Potassium  4.6  4.7  4.3   Chloride  96  96  96   CO2  27  28  29*   UN  _0 Creatinine  0.62  0.63  0.62   Glucose  124*  111*  121*   Calcium  8.6  9.0  8.7   Magnesium  1.8   --   1.6   Phosphorus  4.9*   --   4.5    Recent Labs      07/31/17   0014  07/30/17   0014   AST  543*  388*   ALT  277*  233*   Alk Phos  1,662*  1,509*   Bilirubin,Total  0.7  0.2     Recent Labs      07/31/17   0014  07/30/17   0014  07/29/17   0019   Total Protein  6.7  7.4  6.6   Albumin  2.8*  3.2*  2.8*   Prealbumin   --    --   10*     No results for input(s): AMY, LIP in the last 72 hours.   GLUCOSE:   No results for input(s): PGLU in the last 72 hours.  Imaging: Chest Single Frontal View    Result Date: 07/30/2017  Mildly hypoexpanded lungs with mild bibasilar opacities, which may represent atelectasis or infection, similar to prior study. Moderate-sized left-sided pleural  effusion. END OF IMPRESSION [facility] submits this DICOM format image data and final report to the [facility], an independent secure electronic health information exchange, on a reciprocally searchable basis (with patient authorization) for a minimum of 12 months after exam date.       ASSESSMENT  Amanda Galloway is a 72 y.o. female with h/o recent PE and pancreatic cancer POD # 102  status post whipple procedure complicated by delayed gastric emptying.  NGT replaced on 04/04/17. UGI on 04/12/17 concerning for obstruction just beyond the [place] in the efferent limb.  Course complicated on 2/44/01 by rising LFTs and sepsis with CT concerning for cholangitis given biliary tree obstruction and PV compression due to hematoma and afferent limb distention in setting of PE treatment. Is s/p IR PTC on 04/16/17 and IR percutaneous aspiration of anterior abdominal fluid collection on 04/24/17. IR LUQ perc drain placement 3/1. IR anterior abdominal 20f perc drain placement 3/3. IR anterior perc drain into left hepatic collection 3/6. Developed E. Coli bacteremia on 07/26/17 presumed secondary to aspiration pneumonia.    PLAN   ALP and transaminitis continue to uptrend, RUQ UKoreato eval considering patient last scan 07/26/17 in effort to minimize contrast load    Continue Antibiotics per ID, IV cefazolin, flagyl, and PO linezolid   Continue regular diet and TFs at goal overnight   Vent G-tube PRN nausea  Hyponatremia since resolved   Aggressive pulmonary toilet  Bowel regimen   Analgesia with PO pain. Appreciate  palliative care assistance   Heparin gtt stopped 5/20. Remains on ppx lovenox for now   OOB/ambulate, PT/OT   Dispo: SNF pending clinical improvment     Author: ATonia Ghent MD  as of: 07/31/2017  at: 7:13 AM

## 2017-07-31 NOTE — Progress Notes (Signed)
Physical Therapy Treatment Note:       07/31/17 0900   Visit Number   Visit Number [facility]) / Treatment Day [facility]) (attempt)   Additional Comments   Additional comments Attempted to see pt for PT session. RN reported pt febrile and tachycardiac, requesting PT to hold at this time. Will re-attempt when appropriate and time permits.     Rodena Piety, PT, DPT  Pager [phone]

## 2017-07-31 NOTE — Progress Notes (Signed)
07/31/17 0835 07/31/17 0848 07/31/17 1041   Vital Signs   Temp (!) 38.6 C (101.4 F) --  37.8 C (100 F)   Temp src Oral --  Oral   Heart Rate (!) 127 --  (!) 115   Heart Rate Source Automated/oximeter --  Automated/oximeter   Resp (!) 40 (!) 32 (!) 28   BP 150/66 --  118/52   BP Method Manual --  Manual   BP Location Right arm --  --    Patient Position Lying --  --    Oxygen Therapy   SpO2 (!) 85 % 94 % 96 %   O2 Device None (Room air) O2 Therapy O2 Therapy   O2 Therapy --  --  Nasal cannula   O2 Flow Rate --  2 L/min 2 L/min     Assumed care at 0700, upon arrival to do VS this am, pt febrile to 38.6, tachy to 127 & RR elevated. Pt more lethargic upon assessment still A&Ox3.  [place], NP notified & aware. BC x2 (1 form distal port & 1 from left hand collected).  Lactate & CBC completed. STAT chest xray & abdominal ultrasound completed per order. Pt vomited 350cc brown emesis w/ morning pills in it. [place], NP notified, prn zofran changed to IV & given at 1208 w/ positive effect. Incont of urine. Straight cath per order for sterile urine sample at 1039. Per [place], NP hold off on anything through TF at this time r/t leaking at site.  Pt VS rechecked & VSS at this time (see above.  Pt currently resting in bed. Will continue to monitor.  Salome Holmes, RN

## 2017-07-31 NOTE — Progress Notes (Signed)
SW reviewed chart and noted pt back on IV antibiotics TID. SNF cannot handle IV antibiotics TID, Discussed with NP and plan is for up to ten days of IV Cefazolin, TID. Placement office contacted and updated on IV's, will put packet on hold for now until pt is off of TID IV antibiotics. NP updated. Will continue to assist as needed and plan for safe discharge. Will updated daughter when she comes in today.

## 2017-07-31 NOTE — Progress Notes (Signed)
07/31/17 0900   [facility] Patient Class Review   Patient Class Review Inpatient   Patient Class Effective 03/29/2017  Earnest Conroy RN   Utilization  Management  [phone]  Page [phone]

## 2017-07-31 NOTE — Progress Notes (Addendum)
Infectious Diseases Follow Up Note    Personally reviewed chart, labs, medications. Patient seen.     CC:  F/U s/p VRE bacteremia, liver and abdominal abscesses (VRE, enterobacter, resistant to fluconazole candida glabrata and candida albicans, s/p Enterobacter cloacae bacteremia, and most recently E.coli bacteremia.     Subjective:   Amanda Galloway is weepy today, mildly confused during visit. She states her cough remains terrible (did not cough during visit), denies SOB, denies N/V/D but staff RN states patient vomited once this morning after c/o nausea, RN confirms pt does not have diarrhea and was in fact received a stool softener yesterday.   Zahari denies feeling feverish or having chills, endorses diaphoresis most of morning.  Denies urinary complaints.       ROS: as above.     Current Meds:  Scheduled Meds:   piperacillin-tazobactam  4.5 g Intravenous Q6H    guaiFENesin  600 mg Oral 2 times per day    methadone  7.5 mg Oral Daily    pregabalin  75 mg Oral 2 times per day    sodium chloride  2 g Per G Tube TID PC    metoclopramide  10 mg Oral TID AC    levothyroxine  150 mcg Oral Daily @ 0600    enoxaparin  40 mg Subcutaneous Daily @ 2100    linezolid  600 mg Oral 2 times per day    sennosides  17.6 mg Oral 2 times per day    PROSOURCE NO CARB  30 mL Oral Daily with breakfast    polyethylene glycol  17 g Oral Daily    lactobacillus rhamnosus (GG)  1 capsule Oral Daily    bisacodyl  10 mg Rectal Daily    DULoxetine  20 mg Oral Q24H    lidocaine  1 patch Transdermal Q24H     Continuous Infusions:   sodium chloride 50 mL/hr (07/31/17 1009)     PRN Meds:.   calcium carbonate  1,000 mg Oral BID PRN    HYDROmorphone PF  0.5 mg Intravenous Q1H PRN    Or    HYDROmorphone PF  1 mg Intravenous Q1H PRN    LORazepam  0.5 mg Oral Q6H PRN    heparin lock flush  50 Units Intracatheter PRN    ondansetron  4 mg Intravenous Q6H PRN     Objective:  BP: (110-150)/(52-68)   Temp:  [36.2 C (97.2 F)-38.6 C (101.4  F)]   Temp src: Temporal (05/29 1208)  Heart Rate:  [78-127]   Resp:  [16-40]   SpO2:  [85 %-98 %]     Physical Exam:              General Appearance: laying in bed, appears stressed and worried, weeping at times, but smiling at staff entering room.    HEENT: MMM, OP clear  Pulm: Crackles bibasilar, left >right, no wheezes,  WOB unlabored on 2-3 litters, O2 sat 98% at time of visit.   CV: RRR (rate 96), S1S2, no M/R/G, bilateral lower extremity edema persists.   Extremities: WWP, MAE with equal strength, b/l LE edema, no calf tenderness.   Abdomen: overall soft, NT, mildly distended, PEG site with small amount tan exudate at insertion site, very slightly erythematous, skin intact.      Neuro: oriented to person/place, mildly confused to time, events occurring around her.   Lines: PICC LUE (4/11) site non tender/appearance benign       Recent Labs  Lab 07/31/17  0925  07/31/17  0014 07/30/17  0014   WBC 12.7* 6.4 7.6   Hemoglobin 8.3* 7.2* 7.6*   Hematocrit 26* 23* 25*   Platelets 295 243 285   Seg Neut % 86.8 61.9 68.1   Lymphocyte % 3.8 14.5 11.4   Monocyte % 7.1 10.9 11.9   Eosinophil % 1.5 11.7 7.8       Recent Labs  Lab 07/31/17  0014 07/30/17  0014 07/29/17  0019   Sodium 134 136 135   Potassium 4.6 4.7 4.3   CO2 27 28 29*   UN '14 12 11   ' Creatinine 0.62 0.63 0.62   Glucose 124* 111* 121*   Calcium 8.6 9.0 8.7         Lab results: 07/31/17  0014 07/30/17  0014 07/29/17  0019 07/28/17  0025 07/27/17  0015   Total Protein 6.7 7.4 6.6 6.3 6.4   Albumin 2.8* 3.2* 2.8* 2.6* 2.7*   ALT 277* 233* 113* 101* 122*   AST 543* 388* 184* 84* 127*   Alk Phos 1,662* 1,509* 1,241* 750* 796*   Bilirubin,Total 0.7 0.2 0.3 0.2 0.3       Lab Results  Component Value Date/Time   CRP 63 (H) 07/29/2017 0019   CRP 54 (H) 07/19/2017 0107   CRP 46 (H) 07/12/2017 0029       Lab Results  Component Value Date/Time   Sedimentation Rate 39 (H) 07/29/2017 0019   Sedimentation Rate 40 (H) 07/19/2017 0107   Sedimentation Rate >=130 (!)  07/12/2017 0029       Micro:   1/25: Intraoperative bile: GS 0 pmns, no organisms, Cx: no growth  2/12: 1 set of blood cultures from the right Mediport (no peripheral bld was sent): positive for Enterobacter cloacae complex in 70 hrs, sensitive to zosyn.   2/18 abdominal GS had >25 PMNs, many GPC in pairs/chains, many GNB and cultures 4+ Enterobacter cloacae complex. The cultures also grew 1+ Candida glabrata   2/21: BCx from the Right peripheral IV, Mediport, L IJ:  No growth  05/03/17: IR-guided I&D with drain placement of the left upper quadrant collection: GS > 25 PMNs, many GNB and GN diplococci, Cultures with 4+ Enterobacter cloacae complex (resistant to Zosyn and CTX), C. Albicans and C. Glabrata, and Candida tropicalis  05/04/17:    1 set of blood cultures from left IJ: VRE at 15.3 TTP. Daptomycin sensitivities: MIC 41mg/ml (dose dependent),  Linezolid   1 set from periphery: VRE and Enterobacter cloacae at 14.8 TTP   05/05/17:  IR-guided I&D with drain placement of the intrahepatic collection: GS 1-10 PMNs, very few GPC in pairs. Fungal cultures with Candida glabrata  05/05/17: Blood cultures from periphery, Mediport and IJ: no growth  05/04/17: Urine cultures obtained (for unclear reason): no growth  05/08/17: Abscess GS: >25 PMNs, many GPC in chains, few GNB. Aerobic cultures growing 2+ Enterobacter (ertapenem, Cipro, TMP/SMX-sensitive) and 3+VRE, Fungal: C. Glabrata (sens to caspo, vori and dose dependent sens to fluconazole)  06/06/17: 2 sets of BCx (1 set each from periphery and Mediport) - taken after restarting ertapenem: IVAD grew VRE ttp 18.2 hours,  Periphery (Left arm) grew VRE ttp 17.3.  06/07/17 Pleural fluid (left) GS 1-10 PMN's, 1-10 Nucleated white cells, No organisms seen and 1604 nucleated cells. No growth on culture.   06/07/17 Abdominal abscess: labs cancelled - no specimens received.   06/08/17: Mediport BC + for VRE in 23 hrs (R-Amp, R- tetracycline per lab, suppressed result, R-  doxycycline;   S-Linezolid, S-Dapto dose dependent),  Right hand peripheral cx: No growth   06/10/17: Blood culture 2/2: 1 periphery (RHand), 1 from Mediport before removed: no growth  06/11/17: Blood Culture 2/2 (both periphery) - no growth   06/19/17:  Perigastric fluid #1:  GS 10-25 PMNs -  GPB, GNB, GPC in pairs. 1+ C. Glabrata (R- fluconazole, voriconazole, S- Caspofungin); 2+ Enterococcus faecium (R- amp, PenG, Vanco; S- Linezolid)  06/19/17:  Liver fluid collection:  GS: 0 PMNs, Fungal and AFB stains negative, 1+ enterococcus faecium (R- Amp, PenG, Vanco, S- Dapto dose dependent, Linezolid)  06/19/17 : Perigastric fluid #2: GS: >25 PMNs, Gram negative bacilli; 3+ C. Glabrata.   07/04/17 U/A negative  07/04/17 Blood Cultures 3/3 (1 set PICC proximal port, 2 sets periphery): NGTD  07/04/17 MRSA amplification Nares: negative  07/05/17 Blood Culture 3/3 (2 PICC, 1 Periph) - NGTD  07/05/17 Liver Abscess (IR drain)- <1PMNs, GPC in prs and chains, 1 colony candida albicans (R- fluconazole, S-Caspo, vori)), 4+ Enterococcus faecium - two isolates with one now resistant to Daptomycin, the other dose dependent.   5/24: BCx 2 sets 1 peripheral 1 PICC. Peripheral grew E.coli after 13 hrs (Pansensitive).  5/24: UA no WBCS  5/26: BCx 2 sets 1 peripheral 1 PICC - NGTD  preliminary   5/27 Sputum Gram Stain >10 squamous epithealial cells <25 PMNs - not cultured/contaminated  5/29: BCx 2 sets (1 set periph, 1 set PICC): NGDT  preliminary  5/29 Sputum - not collected yet  5/29 U/A negative       Imaging/Other Relevant Diagnostics:   5/29 CXR:   1.  Interval increase in bibasilar airspace opacities, left greater than right, likely related to increasing pleural fluid and atelectasis.   2.  The interstitial markings are mildly prominent consistent with underlying edema    5/29 US abdomen:  Two intrahepatic collections are noted in the right lobe, similar in size compared to recent CT dated 07/26/2016. One is intrahepatic and may be an infarct or an abscess or  hematoma. The other is subcapsular and is likely a hematoma.      Assessment and Plan:  ID problem(s):   - E.coli bacteremia: from intraabdominal collections vs. Aspiration pneumonia.  - VRE Bacteremia   - Multiple and extensive Intra-abdominal abscesses growing VRE, enterobacter and Candida glabrata, C.albicans    72 y.o. female with h/o recent PE and pancreatic cancer, s/p whipple procedure (goal of cure), complicated by delayed gastric emptying, elevated LFTs and sepsis, malnutrition 2/2 early satiety and anorexia (requiring PEG), and multiple intraabdominal and hepatic abscesses and drains. Most recent sepsis episode on 5/2 requiring ICU observation/fluids/increased O2 demands, no pressors required, stabilized quickly, source was not definitively identified, returned to [facility] floor on 5/7.  She has since slowly and steadily continued to improve.  The TPN weaned to off, she is tolerating TFs and taking in small amounts of food from regular diet tray.  Her last abdominal drain was pulled today (07/22/17).  Ertapenem was discontinued on 07/18/17 (2/2 LFTs fluctuating upwards and she had not grown out gram negatives in 2 months ).  On 5/24, Nakeita developed some confusion, tachycarida, anorexia (more than baseline), and developed new cough. Blood cultures were obtained from her PICC and periphery. The peripheral stick grew E.coli after 13 hrs. CT of chest and of abdomen/pelvis with contrast were done. Chest CT showed a new small right pleural effusion, unchanged moderate left pleural effusion and progressive consolidative changes right lower lobe most  likely representing atelectasis but infection/infection not entirely excluded as well as ground glass opacities in the peripheral left upper lobe may represent infection or inflammation. Intraabdominal collections stable/slightly decreased. New bacteremia source could be intraabdominal or possibly an aspiration pneumonia. She was restarted on a carbapenem 5/25 (meropenem  narrowed to ertapenem) then further narrowed to cefazolin (for E.coli bacteremia) and flagyl (anaerobes in case of asp pna) yesterday evening.   This morning Rocio spiked fever to 101.45F and elevated white count to 12.7 (from 7.6) accompanied by confusion, hallucinations, and tachycardia. Also with continuing rise in liver chemistries.  Blood cultures and urine sent, pending sputum sample for culture.   Antibiotics broadened to Zosyn.  CXR demonstrating edema and persistent/increasing plueral fluid L>R. Abdominal US showing two fluid collections which appear relatively stable as compared to A/P CT on 5/24.     It seems from reviewing the CXR and development of a new cough in this patient is c/w pneumonia as cause for fever and leukocytosis. She is not having diarrhea, therefore active C.diff is not on differential.  Abdominal US was not convincing for abdominal cause but should cholangitis remain suspect, Zosyn provides broad coverage including anaerobes that should suffice for either pna (unless atypical) or abdominal infection.  We have recommend dosing Zosyn at 4.5 gm every 6 hours to provide pseudomonal coverage given time patient has been in the hospital.      Will continue follow cultures and clinical progress on Zosyn, will continue Linezolid durations for each TBD.       Recommendations:    Discontinue cefazolin and flagyl   Start Zosyn IV 4.5 gm every 6 hours   Continue Linezolid 600 mg orally twice daily for VRE, duration TBD.    Follow daily CBC with diff and CMP for now   Follow blood cultures   Obtain sputum culture   ID team 3 will continue to follow along.    Patient seen by both myself and ID attending, Dr. William Hamburger, plan discussed in detail with Dr. William Hamburger.     Lonzo Candy, ANP-BC  Infectious Diseases, Team 3  Pager [phone]  Office Ph [phone]      ID attending addendum:  I reviewed and discussed with the APP recent events, all labs, imaging.  I personally examined the patient, and agree  with the physical and plan as above, with the addendum:    If continues to spike and pulmonary symptoms do not improve, consider atypical coverage such as azithromycin or quinolone.

## 2017-08-01 ENCOUNTER — Inpatient Hospital Stay: Payer: Medicare (Managed Care)

## 2017-08-01 DIAGNOSIS — K6389 Other specified diseases of intestine: Secondary | ICD-10-CM

## 2017-08-01 DIAGNOSIS — K9429 Other complications of gastrostomy: Secondary | ICD-10-CM

## 2017-08-01 LAB — CBC AND DIFFERENTIAL
Baso # K/uL: 0.1 10*3/uL (ref 0.0–0.1)
Basophil %: 0.6 %
Eos # K/uL: 0.5 10*3/uL — ABNORMAL HIGH (ref 0.0–0.4)
Eosinophil %: 5.5 %
Hematocrit: 23 % — ABNORMAL LOW (ref 34–45)
Hemoglobin: 7.3 g/dL — ABNORMAL LOW (ref 11.2–15.7)
IMM Granulocytes #: 0 10*3/uL
IMM Granulocytes: 0.2 %
Lymph # K/uL: 0.4 10*3/uL — ABNORMAL LOW (ref 1.2–3.7)
Lymphocyte %: 5 %
MCH: 29 pg/cell (ref 26–32)
MCHC: 31 g/dL — ABNORMAL LOW (ref 32–36)
MCV: 93 fL (ref 79–95)
Mono # K/uL: 0.6 10*3/uL (ref 0.2–0.9)
Monocyte %: 6.9 %
Neut # K/uL: 7 10*3/uL — ABNORMAL HIGH (ref 1.6–6.1)
Nucl RBC # K/uL: 0 10*3/uL (ref 0.0–0.0)
Nucl RBC %: 0 /100 WBC (ref 0.0–0.2)
Platelets: 235 10*3/uL (ref 160–370)
RBC: 2.5 MIL/uL — ABNORMAL LOW (ref 3.9–5.2)
RDW: 18.6 % — ABNORMAL HIGH (ref 11.7–14.4)
Seg Neut %: 81.8 %
WBC: 8.5 10*3/uL (ref 4.0–10.0)

## 2017-08-01 LAB — BLOOD CULTURE: Bacterial Blood Culture: 0

## 2017-08-01 LAB — COMPREHENSIVE METABOLIC PANEL
ALT: 256 U/L — ABNORMAL HIGH (ref 0–35)
AST: 430 U/L — ABNORMAL HIGH (ref 0–35)
Albumin: 2.8 g/dL — ABNORMAL LOW (ref 3.5–5.2)
Alk Phos: 1444 U/L — ABNORMAL HIGH (ref 35–105)
Anion Gap: 13 (ref 7–16)
Bilirubin,Total: 2.1 mg/dL — ABNORMAL HIGH (ref 0.0–1.2)
CO2: 25 mmol/L (ref 20–28)
Calcium: 8.5 mg/dL — ABNORMAL LOW (ref 8.6–10.2)
Chloride: 97 mmol/L (ref 96–108)
Creatinine: 0.67 mg/dL (ref 0.51–0.95)
GFR,Black: 102 *
GFR,Caucasian: 89 *
Glucose: 101 mg/dL — ABNORMAL HIGH (ref 60–99)
Lab: 14 mg/dL (ref 6–20)
Potassium: 4.2 mmol/L (ref 3.3–5.1)
Sodium: 135 mmol/L (ref 133–145)
Total Protein: 6.5 g/dL (ref 6.3–7.7)

## 2017-08-01 LAB — BILIRUBIN, DIRECT: Bilirubin,Direct: 1.8 mg/dL — ABNORMAL HIGH (ref 0.0–0.3)

## 2017-08-01 LAB — MAGNESIUM: Magnesium: 1.7 mg/dL (ref 1.6–2.5)

## 2017-08-01 LAB — PHOSPHORUS: Phosphorus: 4.2 mg/dL (ref 2.7–4.5)

## 2017-08-01 MED ORDER — METHADONE HCL 10 MG PO TABS *I*
10.0000 mg | ORAL_TABLET | Freq: Two times a day (BID) | ORAL | Status: DC
Start: 2017-08-01 — End: 2017-08-07
  Administered 2017-08-01 – 2017-08-07 (×13): 10 mg via ORAL
  Filled 2017-08-01 (×14): qty 1

## 2017-08-01 MED ORDER — HYDROMORPHONE HCL 4 MG PO TABS *I*
8.0000 mg | ORAL_TABLET | ORAL | Status: AC | PRN
Start: 2017-08-01 — End: 2017-08-12
  Administered 2017-08-01 – 2017-08-11 (×60): 8 mg via ORAL
  Filled 2017-08-01 (×63): qty 2

## 2017-08-01 MED ORDER — METHADONE HCL 5 MG PO TABS *I*
7.5000 mg | ORAL_TABLET | Freq: Every day | ORAL | Status: AC
Start: 2017-08-01 — End: 2017-08-07
  Administered 2017-08-01 – 2017-08-07 (×7): 7.5 mg via ORAL
  Filled 2017-08-01 (×7): qty 2

## 2017-08-01 NOTE — Progress Notes (Signed)
Surgical Oncology Surgery Progress Note    Patient: Amanda Galloway    LOS: 125 days    Attending: [place]  NAEON, afebrile vital signs remain stable   Abx broadened yesterday to Zosyn yesterday after Tmax 28.6, Blood cultures NGTD   RUQ [name] with stable intrahepatic collections  Pain controlled  Tolerating PO, TFs not given overnight as tube check was not yet completed   Alk phos 9485(4627)  ALT 256 (277)  AST 430 (543)  TB 2.1 (0.7)     OBJECTIVE  Physical Exam:  Temp:  [36.3 C (97.3 F)-38.6 C (101.4 F)] 36.3 C (97.3 F)  Heart Rate:  [82-127] 82  Resp:  [16-40] 16  BP: (100-150)/(50-66) 124/62   GEN: no apparent distress  HEENT: NCAT, face symmetric  CHEST: nonlabored respirations on 2LNC   ABD: soft, mildly distended, minimally tender surrounding G-tube that is CDI, minimal erythema, without active leaking   NEURO/MOTOR: alert, appropriate  EXTREMITIES: atraumatic  VASCULAR: feet wwp, 1+ peripheral edema in BLE    Intake/Output:  05/29 0700 - 05/30 0659  In: 1384.2 [P.O.:420; I.V.:600]  Out: 350      Medications:  Current Facility-Administered Medications   Medication    ondansetron (ZOFRAN) injection 4 mg    alteplase (CATHFLO,ACTIVASE) injection 0.5-2 mg    HYDROmorphone (DILAUDID) injection 0.5 mg    Or    HYDROmorphone (DILAUDID) injection 1 mg    piperacillin-tazobactam (ZOSYN) IVPB 4.5 g    methadone (DOLOPHINE) tablet 10 mg    guaiFENesin (MUCINEX) 12 hr tablet 600 mg    pregabalin (LYRICA) capsule 75 mg    sodium chloride tablet 2 g    metoclopramide (REGLAN) tablet 10 mg    HYDROmorphone (DILAUDID) tablet 8 mg    sodium chloride 0.9% IV    levothyroxine (SYNTHROID, LEVOTHROID) tablet 150 mcg    enoxaparin (LOVENOX) injection 40 mg    linezolid (ZYVOX) tablet 600 mg    sennosides (SENOKOT) 8.8 MG/5ML syrup 17.6 mg    PROSOURCE NO CARB liquid LIQD 30 mL    albuterol (PROVENTIL) nebulization 2.5 mg    polyethylene glycol (GLYCOLAX,MIRALAX) powder 17 g     lactobacillus rhamnosus (GG) (CULTURELLE) capsule 1 each    methyl salicylate-menthol (BENGAY) topical cream    bisacodyl (DULCOLAX) suppository 10 mg    DULoxetine (CYMBALTA) DR capsule 20 mg    naloxone (NARCAN) 0.4 mg/mL injection 0.1 mg    mineral oil-hydrophilic petrolatum (AQUAPHOR) ointment    sodium chloride 0.9 % flush 10 mL    sodium chloride 0.9 % flush 10 mL    sodium chloride 0.9 % FLUSH REQUIRED IF PATIENT HAS IV    dextrose 5 % FLUSH REQUIRED IF PATIENT HAS IV    lidocaine (LIDODERM) 5 % patch 1 patch     Facility-Administered Medications Ordered in Other Encounters   Medication    etomidate (AMIDATE) 2 mg/mL injection    rocuronium (ZEMURON) 10 mg/mL injection         Laboratory values:   Recent Labs      07/31/17   2344  07/31/17   0925   WBC  8.5  12.7*   Hemoglobin  7.3*  8.3*   Hematocrit  23*  26*   Platelets  235  295     No components found with this basename: APTT, PT Recent Labs      07/31/17   2344  07/31/17   0014  Sodium  135  134   Potassium  4.2  4.6   Chloride  97  96   CO2  25  27   UN  14  14   Creatinine  0.67  0.62   Glucose  101*  124*   Calcium  8.5*  8.6   Magnesium   --   1.8   Phosphorus   --   4.9*    Recent Labs      07/31/17   2344  07/31/17   0014   AST  430*  543*   ALT  256*  277*   Alk Phos  1,444*  1,662*   Bilirubin,Total  2.1*  0.7     Recent Labs      07/31/17   2344  07/31/17   0014   Total Protein  6.5  6.7   Albumin  2.8*  2.8*     No results for input(s): AMY, LIP in the last 72 hours.   GLUCOSE:   No results for input(s): PGLU in the last 72 hours.  Imaging: Chest Single Frontal View    Result Date: 07/31/2017  1.  Interval increase in bibasilar airspace opacities, left greater than right, likely related to increasing pleural fluid and atelectasis. 2.  The interstitial markings are mildly prominent consistent with underlying edema. END OF IMPRESSION [facility] submits this DICOM format image data and final report to the [facility], an  independent secure electronic health information exchange, on a reciprocally searchable basis (with patient authorization) for a minimum of 12 months after exam date.    Chest Single Frontal View    Result Date: 07/30/2017  Mildly hypoexpanded lungs with mild bibasilar opacities, which may represent atelectasis or infection, similar to prior study. Moderate-sized left-sided pleural effusion. END OF IMPRESSION [facility] submits this DICOM format image data and final report to the [facility], an independent secure electronic health information exchange, on a reciprocally searchable basis (with patient authorization) for a minimum of 12 months after exam date.    Portable US Abdomen Limited Single Quad    Result Date: 07/31/2017  Two intrahepatic collections are noted in the right lobe, similar in size compared to recent CT dated 07/26/2016. One is intrahepatic and may be an infarct or an abscess or hematoma. The other is subcapsular and is likely a hematoma. END OF IMPRESSION. I have personally reviewed the images and the Resident's/Fellow's interpretation and agree with or edited the findings. [facility] submits this DICOM format image data and final report to the [facility], an independent secure electronic health information exchange, on a reciprocally searchable basis (with patient authorization) for a minimum of 12 months after exam date.       ASSESSMENT  Amanda Galloway is a 72 y.o. female with h/o recent PE and pancreatic cancer POD # 102  status post whipple procedure complicated by delayed gastric emptying.  NGT replaced on 04/04/17. UGI on 04/12/17 concerning for obstruction just beyond the [place] in the efferent limb.  Course complicated on 1/76/16 by rising LFTs and sepsis with CT concerning for cholangitis given biliary tree obstruction and PV compression due to hematoma and afferent limb distention in setting of PE treatment. Is s/p IR PTC on 04/16/17 and IR percutaneous aspiration of anterior abdominal  fluid collection on 04/24/17. IR LUQ perc drain placement 3/1. IR anterior abdominal 52f perc drain placement 3/3. IR anterior perc drain into left hepatic collection 3/6. Developed E. Coli bacteremia on 07/26/17  presumed secondary to aspiration pneumonia.    PLAN   ALP and transaminitis downtrending, TB uptrending 2.1 will continue to monitor    Continue Antibiotics per ID, IV zosyn and PO linezolid   Continue regular diet and TFs at goal overnight; bedside tube check this AM    Vent G-tube PRN nausea  Hyponatremia since resolved   Aggressive pulmonary toilet  Bowel regimen   Analgesia with PO pain. Appreciate  palliative care assistance   Heparin gtt stopped 5/20. Remains on ppx lovenox for now   OOB/ambulate, PT/OT   Dispo: SNF pending clinical improvment     Author: Tonia Ghent, MD as of: 08/01/2017  at: 6:20 AM

## 2017-08-01 NOTE — Progress Notes (Signed)
Pt left for tube check at 1130 back to floor at 1215, VSS. Will continue to monitor.  Salome Holmes, RN

## 2017-08-01 NOTE — Progress Notes (Signed)
Assumed care at 0700, VSS. Pt up to chair this am. Off to Xray at 01100. PRN dilaudid given throughout shift. Tol some apple sauce & other small bites of soids. At 1500, writer into assess pt & noticed more leaking around peg tube site/gown to be soaked at this time, Mee Hives, NP notified, no new orders at this time. Ok for TF to be restarted this evening, no residuals noted when started TF at 105ml/hr. Pt willing to do another walk this evening. Pt had almost half of a cheeseburger for dinner that family brought in. Will continue to monitor.  Salome Holmes, RN

## 2017-08-01 NOTE — Progress Notes (Signed)
Palliative Care Progress Note  Brief HPI: Amanda Galloway is a [name] w/ pancreatic CA s/p Whipple 1/25 (treatment for cure) w/ prolonged complicated hospitalization issues w/ infection, nutrition/appetite/nausea/constipation, s/p PEG w/ perforation (now resolved). CT abd 5/16 showed no new fluid collections and decrease in existing hepatic collections. Pt resumed methadone on 5/8 and weaning off her dilaudid PCA 5/23. All drains now removed, receiving TFs at night to optimize appetite during day. Referrals for SNF rehab pending. Pt w/ signs of sepsis 5/24 & +BC w/ RLL consolidation on CT concerning for asp pna, abd CT w/o acute changes. Pt initially improved w/ broadening of abx, then developed fever 5/28 requiring additional work up. Abd [name] stable fluid collections, antibiotics broadened to zosyn and IR tube study check.     Significant 24h Events:Still having shoulder and abdominal pain. LFTs remain elevated with slight decrease.  Hepatology/Surgery feel r/t bacteremia.      Subjective: Noted pain with ambulation. "I'm not due yet for my dilaudid. The pain in my belly and shoulders is about the same".       Palliative Care ROS: Some shortness of breath with activity. Describes pain in abdomen and shoulders "sharp". Denies nausea, constipation.     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    Anemia D64.9    Hypothyroidism E03.9    Pancreatic cancer C25.9    Anemia D64.9     hx of Pulmonary embolism I26.99    Acute pulmonary insufficiency J98.4    Depression F32.9    Sepsis A41.9       No Known Allergies (drug, envir, food or latex)    Scheduled Meds:    methadone  10 mg Oral BID    methadone  7.5 mg Oral Daily    piperacillin-tazobactam  4.5 g Intravenous Q6H    guaiFENesin  600 mg Oral 2 times per day    pregabalin  75 mg Oral 2 times per day    sodium chloride  2 g  Per G Tube TID PC    metoclopramide  10 mg Oral TID AC    levothyroxine  150 mcg Oral Daily @ 0600    enoxaparin  40 mg Subcutaneous Daily @ 2100    linezolid  600 mg Oral 2 times per day    sennosides  17.6 mg Oral 2 times per day    PROSOURCE NO CARB  30 mL Oral Daily with breakfast    polyethylene glycol  17 g Oral Daily    lactobacillus rhamnosus (GG)  1 capsule Oral Daily    bisacodyl  10 mg Rectal Daily    DULoxetine  20 mg Oral Q24H    lidocaine  1 patch Transdermal Q24H       Continuous Infusions:    sodium chloride 50 mL/hr (08/01/17 1223)       PRN Meds:  HYDROmorphone, ondansetron, alteplase, HYDROmorphone PF **OR** HYDROmorphone PF, albuterol, methyl salicylate-menthol, naloxone, mineral oil-hydrophilic petrolatum, sodium chloride, sodium chloride, sodium chloride, dextrose    Past 24h PRN Usage:  Ibuprofen 600 mg x 2  Dilaudid 8 mg po x 6    Physical Examination:   BP: (94-124)/(50-64)   Temp:  [36.3 C (97.3 F)-36.6 C (97.9 F)]   Temp src: Temporal (05/30 1238)  Heart Rate:  [82-103]   Resp:  [16-18]  SpO2:  [90 %-98 %]   General appearance: mild distress when attempting to ambulate, otherwise smiling.  Head: normocephalic, MMM, sclera anicteric  Lungs: CTA mild increased work of breathing with ambulation. Decreased BS L>R  Heart: S1, S2 RRR  Abdomen: slightly bloated, PEG site w scant drg on dressing. + BS  Extremities: BLE edema  Neurologic: A+O x 3, mood stable.    Assessment/Plan:  Amanda Galloway is a 72 yo female w pancreatic cancer s/p whipple with prolonged hospitalization and now w recurrent bacteremia of unknown source. Acute on chronic pain does not warrant change in pain regime currently.     Pain/dyspnea  Dilaudid 8 mg po Q 2hrs prn    Lyrica 75 mg po BID for fibromyalgia (total 150 mg/d, increased 5/28)  Methadone 7.07/12/08 mg po (total 27.5 mg/d)restarted 5/7, last increase 5/14 (ECG on 5/2 w/ QTc 392)  Albuterol neb Q 6 hrs prn (none used)  Lidocaine patch daily x 2 (abd,  LUE)  Acetaminophen 650 mg po Q 6 hrs prn fever   Bengay cream 4x/d prn to joints  Guaifenesin 600 mg po BID expectorant    Anxiety/agitation/nausea/fatigue  Cymbalta 20 mg po/d  Metoclopramide 10 mg po TID AC  Ondansetron 4 mg po Q 6 hrs ATC (started 5/16)  Ativan 0.25 mg IV Q 6 hrs prn (x 1 on 5/16)  Encouraged complementary adjuvant therapies for coping, meditation app, aromatherapy, gentle massage     Prevention of opioid induced constipation, last stool 5/30  Bisacodyl supp pr daily   Miralax 17 gm po daily  Senna 17.6 mg po BID    Dry flaky edematous Skin  Aquaphor prn   Eucerin prn for BLEs      Dispo: Plan of care d/w patient, daughter, and hepatobiliary surgery team. SNF rehab when medically stable.     [name] ANP  Palliative Care Service  [facility] [phone]  08/01/2017 3:54 PM

## 2017-08-01 NOTE — Plan of Care (Signed)
Problem: Safety  Goal: Patient will remain free of falls  Outcome: Maintaining      Problem: Pain/Comfort  Goal: Patient's pain or discomfort is manageable  Outcome: Maintaining    Goal: Patient's pain or discomfort is manageable  Outcome: Maintaining      Problem: Mobility  Goal: Functional status is maintained or improved - Geriatric  Outcome: Maintaining    Goal: Patient's functional status is maintained or improved  Outcome: Maintaining      Problem: Nutrition  Goal: Patient's nutritional status is maintained or improved  Outcome: Maintaining    Goal: Nutritional status is maintained or improved - Geriatric  Outcome: Maintaining      Problem: Cognitive function  Goal: Cognitive function will be maintained  Outcome: Maintaining    Goal: Cognitive function will be maintained or return to baseline  Outcome: Maintaining      Problem: Bowel Elimination  Goal: Elimination patterns are normal or improving  Outcome: Progressing towards goal      Problem: Post-Operative Hemodynamic Stability  Goal: Maintain Hemodynamic Stability  Outcome: Maintaining      Problem: Post-Operative Complications  Goal: Prevent post-operative complications  Outcome: Maintaining      Problem: Post-Operative Bowel Elimination  Goal: Elimination pattern is normal or improving  Outcome: Progressing towards goal      Problem: Psychosocial  Goal: Demonstrates ability to cope with illness  Outcome: Maintaining      Problem: Fluid and Electrolyte Imbalance  Goal: Fluid and Electrolyte imbalance  Outcome: Maintaining      Problem: GI Bleeding Elimination  Goal: Elimination of patterns are normal or improving  Outcome: Maintaining      Problem: Post-Operative Bladder Elimination  Goal: Patient is able to empty bladder or return to baseline  Outcome: Progressing towards goal

## 2017-08-01 NOTE — Progress Notes (Signed)
Amanda Galloway is currently functioning below her baseline and will likely require SNF rehab before returning to she prior living arrangement. she will likely return to her prior level of functioning with a short term rehab stay. However, if discharge directly to home is desired, Amanda Galloway will need 24 hour assistance with all mobility and require Home PT     PT Discharge Equipment Recommended: To be determined if returning to their prior living environment today.    Physical Therapy Treatment Note:     08/01/17 [place]   Visit Number   Visit Number [facility]) / Treatment Day [facility]) 5   Visit Details [facility])   Visit Type [facility]) Follow Up   Reason for visit [facility]) General   Precautions/Observations   Precautions used Yes   LDA Observation IV lines;Feeding tube  (disconnected)   Fall Precautions General falls precautions   Other Pt supine in bed, daughter present and supportive t/o. Pt's PEG tube leaking through gown, RN notified and aware, fixed dressing.   Pain Assessment   *Is the patient currently in pain? Yes   Pain (Before,During, After) Therapy Before;During   0-10 Scale 7   Pain Location Shoulder;Abdomen   Pain Orientation Right;Left   Pain Intervention(s) Repositioned;Refer to nursing for pain management   Vision    Current Vision Wears corrective lenses   Cognition   Arousal/Alertness Appropriate responses to stimuli   Following Commands Follows simple commands with increased time;Follows simple commands with repetition   Bed Mobility   Bed mobility Tested   Supine to Sit Minimum assist ;1 person assist;Verbal cues;Side rails down;Head of bed elevated   Sit to Supine Not tested  (up in recliner at end of session)   Additional comments Pt able to advance BLE towards edge of bed to prepare for transfer. Writer assisted at upper trunk and pt pulled Risk manager to assist bringing into upright position. Pt required modAx1 for scooting towards edge of bed.   Transfers   Transfers  Tested   Sit to Stand Minimum ;1 person assist;Verbal cues   Stand to sit Minimum ;1 person assist;Verbal cues   Transfer Assistive Device rolling walker   Additional comments Pt up from edge of bed and toilet. Writer cueing for increased trunk flexion to enocurage forward weigth shift. Pt required minAx1 t/o, primarily during transition of edge of bed to RW.   Mobility   Mobility Tested   Gait Pattern Decreased cadence;Decreased R step length;Decreased R step height;Decreased L step length;Decreased L step height;Trunk flexed   Ambulation Assist Contact guard;1 person assist   Ambulation Distance (Feet) ~461ft   Ambulation Assistive Device rolling walker   Additional comments Pt required increased VCs t/o for postural positioning, VCs - shoulder depression and scapular restration. Pt required increased enocuragement to complete distance. Attempted to complete with SBA however pt with increase dusnteadiness and required writer's hands on assist to maintain balance.   Therapeutic Exercises   Additional comments Writer educated on postural positioning, cueing shoulder retraction and depression. Pt completed x10 seated rows to enocurgae improved posture   Balance   Balance Tested   Sitting - Static Independent ;Unsupported   Standing - Static Standby assist;Supported   Standing - Dynamic Contact guard;Supported   Additional Comments RW to assist with improved stability.   PT AM-PAC Mobility   Turning over in bed? 1   Sitting down on and standing up from a chair with arms? 1  Moving from lying on back to sitting on the side of the bed? 1   Moving to and from a bed to a chair? 3   Need to walk in hospital room? 3   Climbing 3 - 5 steps with a railing? 3   Total Raw Score 12   Standardized Score 35.33   CMS 1-100% Score 69   Medicare Functional Limit Modifiers CL  60 - < 80% impaired, limited, restricted   Assessment   Brief Assessment Remains appropriate for skilled therapy   Problem List Impaired UE strength;Impaired  UE ROM;Impaired LE strength;Impaired LE ROM;Pain contributing to impairment;Impaired functional mobility;Impaired balance;Impaired endurance   Patient / Family Goal rehab   Plan/Recommendation   Treatment Interventions [facility] PT   PT Frequency 2-4x/wk   Mobility Recommendations 1A with RW, please assist wiht ambulation out of room 3x/day   Referral Recommendations OT   Discharge Recommendations [place]   PT Discharge Equipment Recommended To be determined   Assessment/Recommendations Reviewed With: Patient;Nursing;Family   Next PT Visit progress all mobility; higher level balance with ambulation, dual task   Time Calculation   PT Timed Codes 35   PT Untimed Codes 0   PT Unbilled Time 10   PT Total Treatment 35   Plan and Onset date   Plan of Care Date 07/23/17   Onset Date 03/29/17   Treatment Start Date 07/08/17     Rodena Piety, PT, DPT  Pager [phone]

## 2017-08-02 ENCOUNTER — Inpatient Hospital Stay: Payer: Medicare (Managed Care)

## 2017-08-02 LAB — PROTIME-INR
INR: 1.3 — ABNORMAL HIGH (ref 0.9–1.1)
Protime: 15.4 s — ABNORMAL HIGH (ref 10.0–12.9)

## 2017-08-02 LAB — CBC AND DIFFERENTIAL
Baso # K/uL: 0.1 10*3/uL (ref 0.0–0.1)
Basophil %: 0.7 %
Eos # K/uL: 1.2 10*3/uL — ABNORMAL HIGH (ref 0.0–0.4)
Eosinophil %: 17.2 %
Hematocrit: 23 % — ABNORMAL LOW (ref 34–45)
Hemoglobin: 6.9 g/dL — ABNORMAL LOW (ref 11.2–15.7)
IMM Granulocytes #: 0 10*3/uL
IMM Granulocytes: 0.4 %
Lymph # K/uL: 0.5 10*3/uL — ABNORMAL LOW (ref 1.2–3.7)
Lymphocyte %: 7.2 %
MCH: 29 pg/cell (ref 26–32)
MCHC: 31 g/dL — ABNORMAL LOW (ref 32–36)
MCV: 96 fL — ABNORMAL HIGH (ref 79–95)
Mono # K/uL: 0.5 10*3/uL (ref 0.2–0.9)
Monocyte %: 6.9 %
Neut # K/uL: 4.9 10*3/uL (ref 1.6–6.1)
Nucl RBC # K/uL: 0 10*3/uL (ref 0.0–0.0)
Nucl RBC %: 0 /100 WBC (ref 0.0–0.2)
Platelets: 257 10*3/uL (ref 160–370)
RBC: 2.4 MIL/uL — ABNORMAL LOW (ref 3.9–5.2)
RDW: 19 % — ABNORMAL HIGH (ref 11.7–14.4)
Seg Neut %: 67.6 %
WBC: 7.2 10*3/uL (ref 4.0–10.0)

## 2017-08-02 LAB — COMPREHENSIVE METABOLIC PANEL
ALT: 190 U/L — ABNORMAL HIGH (ref 0–35)
AST: 242 U/L — ABNORMAL HIGH (ref 0–35)
Albumin: 2.9 g/dL — ABNORMAL LOW (ref 3.5–5.2)
Alk Phos: 1195 U/L — ABNORMAL HIGH (ref 35–105)
Anion Gap: 12 (ref 7–16)
Bilirubin,Total: 1.4 mg/dL — ABNORMAL HIGH (ref 0.0–1.2)
CO2: 25 mmol/L (ref 20–28)
Calcium: 8.6 mg/dL (ref 8.6–10.2)
Chloride: 99 mmol/L (ref 96–108)
Creatinine: 0.68 mg/dL (ref 0.51–0.95)
GFR,Black: 102 *
GFR,Caucasian: 88 *
Glucose: 127 mg/dL — ABNORMAL HIGH (ref 60–99)
Lab: 14 mg/dL (ref 6–20)
Potassium: 3.9 mmol/L (ref 3.3–5.1)
Sodium: 136 mmol/L (ref 133–145)
Total Protein: 6.7 g/dL (ref 6.3–7.7)

## 2017-08-02 LAB — BLOOD CULTURE
Bacterial Blood Culture: 0
Bacterial Blood Culture: 0

## 2017-08-02 LAB — BILIRUBIN, DIRECT: Bilirubin,Direct: 1.2 mg/dL — ABNORMAL HIGH (ref 0.0–0.3)

## 2017-08-02 LAB — PHOSPHORUS: Phosphorus: 3.9 mg/dL (ref 2.7–4.5)

## 2017-08-02 LAB — MAGNESIUM: Magnesium: 1.8 mg/dL (ref 1.6–2.5)

## 2017-08-02 LAB — STREP A CULTURE, THROAT

## 2017-08-02 MED ORDER — HYDROMORPHONE HCL 2 MG/ML IJ SOLN *WRAPPED*
0.5000 mg | Freq: Once | INTRAMUSCULAR | Status: AC
Start: 2017-08-02 — End: 2017-08-02
  Administered 2017-08-02: 0.5 mg via INTRAVENOUS
  Filled 2017-08-02: qty 1

## 2017-08-02 MED ORDER — MICONAZOLE NITRATE 2 % EX POWD *I*
Freq: Two times a day (BID) | CUTANEOUS | Status: DC
Start: 2017-08-02 — End: 2017-08-24
  Filled 2017-08-02: qty 43

## 2017-08-02 MED ORDER — FUROSEMIDE 10 MG/ML IJ SOLN *I*
20.0000 mg | Freq: Once | INTRAMUSCULAR | Status: AC
Start: 2017-08-02 — End: 2017-08-02
  Administered 2017-08-02: 20 mg via INTRAVENOUS
  Filled 2017-08-02: qty 2

## 2017-08-02 NOTE — Plan of Care (Signed)
Problem: Safety  Goal: Patient will remain free of falls  Outcome: Progressing towards goal      Problem: Pain/Comfort  Goal: Patient's pain or discomfort is manageable  Outcome: Progressing towards goal      Problem: Mobility  Goal: Functional status is maintained or improved - Geriatric  Outcome: Progressing towards goal      Problem: Nutrition  Goal: Patient's nutritional status is maintained or improved  Outcome: Progressing towards goal  Tolerating small amounts of regular diet

## 2017-08-02 NOTE — Consults (Signed)
Wound Care Service Follow Up Note    Patient Location:   [id number]    Patient seen today for follow up for PEG tube site.  Chart reviewed since last seen, including labs and MAR.    Current treatment is: split gauze    Patient c/o:    [x]  Pain  []  Burning   []  Itching  []  Odor   []  No complaints at this time    Physical Exam:      Patient alert, awake, NAD, sitting up in chair.     PEG tube site with minimal drainage, very tender to touch, erythema improved from previous assessment. Some ?hypertrophic tissue visible at medial aspect.     Area cleansed, Cavilon and a thin layer of Critic Aid barrier ointment applied. Split gauze placed.    L inframammary fold - erythema to skin with satellite lesions. Likely from moisture in fold.     Patient Education:  Discussed with patient and bedside RN.   Consider use of cath secure for PEG tube to mitigate patient's c/o of a pulling sensation when ambulating.     Treatment Recommendations:  Turn and position every two hours and PRN  LAL mattress  Offloading:  Use waffle chair cushion  Topical recommendations:    ? PEG tube site - Cleanse wound bed with normal saline, Apply Cavilon to surrounding skin, apply a thin layer of Critic Aid barrier ointment. Place split gauze. Apply twice a day(s) & PRN  ? L inframammary fold - Cleanse with warm water/wash cloth, pat dry and apply a thin dusting of Miconazole powder. Please perform 2x a day.       Thank you very much for consult.  Wound Nurse will follow-up weekly and PRN.   Please call with any questions or concerns.    Roselee Nova, RN, [employer] [phone]

## 2017-08-02 NOTE — Progress Notes (Signed)
Surgical Oncology Surgery Progress Note    Patient: Amanda Galloway    LOS: 126 days    Attending: Hurley  NAEON, afebrile vital signs remain stable   Pain controlled  Tolerating PO, TFs resumed overnight, no episodes of G tube leaking overnight  Voiding spontaneously   +OOB/ambulating   Alk phos 1195( 1444)  ALT 190(256)   AST 242(430)   TB 1.4 (2.1) direct      OBJECTIVE  Physical Exam:  Temp:  [36 C (96.8 F)-36.8 C (98.2 F)] 36.8 C (98.2 F)  Heart Rate:  [82-103] 94  Resp:  [16-20] 20  BP: (92-106)/(52-64) 104/60   GEN: no apparent distress  HEENT: NCAT, face symmetric  CHEST: nonlabored respirations on 2LNC   ABD: soft, mildly distended, minimally tender surrounding G-tube that is CDI, minimal erythema, without active leaking   NEURO/MOTOR: alert, appropriate  EXTREMITIES: atraumatic  VASCULAR: feet wwp, 1+ peripheral edema in BLE    Intake/Output:  05/30 0700 - 05/31 0659  In: 2235.4 [P.O.:540; I.V.:375.3]  Out: 350 [Urine:350]     Medications:  Current Facility-Administered Medications   Medication    methadone (DOLOPHINE) tablet 10 mg    methadone (DOLOPHINE) tablet 7.5 mg    HYDROmorphone (DILAUDID) tablet 8 mg    ondansetron (ZOFRAN) injection 4 mg    alteplase (CATHFLO,ACTIVASE) injection 0.5-2 mg    HYDROmorphone (DILAUDID) injection 0.5 mg    Or    HYDROmorphone (DILAUDID) injection 1 mg    piperacillin-tazobactam (ZOSYN) IVPB 4.5 g    guaiFENesin (MUCINEX) 12 hr tablet 600 mg    pregabalin (LYRICA) capsule 75 mg    sodium chloride tablet 2 g    metoclopramide (REGLAN) tablet 10 mg    levothyroxine (SYNTHROID, LEVOTHROID) tablet 150 mcg    enoxaparin (LOVENOX) injection 40 mg    linezolid (ZYVOX) tablet 600 mg    sennosides (SENOKOT) 8.8 MG/5ML syrup 17.6 mg    PROSOURCE NO CARB liquid LIQD 30 mL    albuterol (PROVENTIL) nebulization 2.5 mg    polyethylene glycol (GLYCOLAX,MIRALAX) powder 17 g    lactobacillus rhamnosus (GG) (CULTURELLE)  capsule 1 each    methyl salicylate-menthol (BENGAY) topical cream    bisacodyl (DULCOLAX) suppository 10 mg    DULoxetine (CYMBALTA) DR capsule 20 mg    naloxone (NARCAN) 0.4 mg/mL injection 0.1 mg    mineral oil-hydrophilic petrolatum (AQUAPHOR) ointment    sodium chloride 0.9 % flush 10 mL    sodium chloride 0.9 % flush 10 mL    sodium chloride 0.9 % FLUSH REQUIRED IF PATIENT HAS IV    dextrose 5 % FLUSH REQUIRED IF PATIENT HAS IV    lidocaine (LIDODERM) 5 % patch 1 patch     Facility-Administered Medications Ordered in Other Encounters   Medication    etomidate (AMIDATE) 2 mg/mL injection    rocuronium (ZEMURON) 10 mg/mL injection         Laboratory values:   Recent Labs      08/01/17   2353  07/31/17   2344   WBC  7.2  8.5   Hemoglobin  6.9*  7.3*   Hematocrit  23*  23*   Platelets  257  235   INR  1.3*   --      No components found with this basename: APTT, PT Recent Labs      08/01/17   2353  07/31/17   2344   Sodium  136  135   Potassium  3.9  4.2   Chloride  99  97   CO2  25  25   UN  14  14   Creatinine  0.68  0.67   Glucose  127*  101*   Calcium  8.6  8.5*   Magnesium  1.8  1.7   Phosphorus  3.9  4.2    Recent Labs      08/01/17   2353  07/31/17   2344   AST  242*  430*   ALT  190*  256*   Alk Phos  1,195*  1,444*   Bilirubin,Total  1.4*  2.1*   Bilirubin,Direct  1.2*  1.8*     Recent Labs      08/01/17   2353  07/31/17   2344   Total Protein  6.7  6.5   Albumin  2.9*  2.8*     No results for input(s): AMY, LIP in the last 72 hours.   GLUCOSE:   No results for input(s): PGLU in the last 72 hours.  Imaging: * Abdomen Standard Ap Single View/kub    Result Date: 08/01/2017  Interval decrease in large bowel distention when compared to prior imaging. Nonspecific bowel gas pattern. END OF IMPRESSION I have personally reviewed the images and the Resident's/Fellow's interpretation and agree with or edited the findings. [facility] submits this DICOM format image data and final report to the [facility], an independent secure electronic health information exchange, on a reciprocally searchable basis (with patient authorization) for a minimum of 12 months after exam date.    Abdomen, Tube Check    Result Date: 08/01/2017  Gastrostomy tube in appropriate position within the stomach with prompt gastric emptying. There is no leak or extraluminal contrast extravasation seen. No contrast is seen tracking along the catheter to the skin. END OF IMPRESSION [facility] submits this DICOM format image data and final report to the [facility], an independent secure electronic health information exchange, on a reciprocally searchable basis (with patient authorization) for a minimum of 12 months after exam date.    *chest Standard Single View    Result Date: 08/02/2017  There is interval increase in pulmonary edema. Interval increase in bilateral small pleural effusions with associated compressive atelectasis. END OF IMPRESSION    Chest Single Frontal View    Result Date: 07/31/2017  1.  Interval increase in bibasilar airspace opacities, left greater than right, likely related to increasing pleural fluid and atelectasis. 2.  The interstitial markings are mildly prominent consistent with underlying edema. END OF IMPRESSION [facility] submits this DICOM format image data and final report to the [facility], an independent secure electronic health information exchange, on a reciprocally searchable basis (with patient authorization) for a minimum of 12 months after exam date.    Chest Single Frontal View    Result Date: 07/30/2017  Mildly hypoexpanded lungs with mild bibasilar opacities, which may represent atelectasis or infection, similar to prior study. Moderate-sized left-sided pleural effusion. END OF IMPRESSION [facility] submits this DICOM format image data and final report to the [facility], an independent secure electronic health information exchange, on a reciprocally searchable basis (with patient authorization)  for a minimum of 12 months after exam date.    Portable US Abdomen Limited Single Quad    Result Date: 07/31/2017  Two intrahepatic collections are noted in the right lobe, similar in size compared to recent CT dated 07/26/2016. One is intrahepatic and  may be an infarct or an abscess or hematoma. The other is subcapsular and is likely a hematoma. END OF IMPRESSION. I have personally reviewed the images and the Resident's/Fellow's interpretation and agree with or edited the findings. [facility] submits this DICOM format image data and final report to the [facility], an independent secure electronic health information exchange, on a reciprocally searchable basis (with patient authorization) for a minimum of 12 months after exam date.       ASSESSMENT  Amanda Galloway is a 72 y.o. female with h/o recent PE and pancreatic cancer POD # 102  status post whipple procedure complicated by delayed gastric emptying.  NGT replaced on 04/04/17. UGI on 04/12/17 concerning for obstruction just beyond the [place] in the efferent limb.  Course complicated on 07/21/82 by rising LFTs and sepsis with CT concerning for cholangitis given biliary tree obstruction and PV compression due to hematoma and afferent limb distention in setting of PE treatment. Is s/p IR PTC on 04/16/17 and IR percutaneous aspiration of anterior abdominal fluid collection on 04/24/17. IR LUQ perc drain placement 3/1. IR anterior abdominal 8f perc drain placement 3/3. IR anterior perc drain into left hepatic collection 3/6. Developed E. Coli bacteremia on 07/26/17 presumed secondary to aspiration pneumonia.    PLAN   ALP and transaminitis and TB downtrending, will continue to monitor    Continue Antibiotics per ID, IV zosyn and PO linezolid, appreciate recs   Continue regular diet and TFs at goal overnight   Vent G-tube PRN nausea  Aggressive pulmonary toilet, CXR this AM   Bowel regimen   Analgesia with PO pain. Appreciate  palliative care assistance   Heparin gtt  stopped 5/20. Remains on ppx lovenox for now   OOB/ambulate, PT/OT   Dispo: SNF pending clinical improvment     Author: ATonia Ghent MD as of: 08/02/2017  at: 6:55 AM

## 2017-08-02 NOTE — Progress Notes (Signed)
Infectious Diseases Follow Up Note    Personally reviewed chart, labs, medications. Patient seen.     CC:  F/U s/p VRE bacteremia, liver and abdominal abscesses (VRE, enterobacter, resistant to fluconazole candida glabrata and candida albicans, s/p Enterobacter cloacae bacteremia, and most recently E.coli bacteremia.     Subjective:   "I have a sore throat".  Amanda Galloway is c/o sore throat and tenderness at right cervical lymph chain (no appreciable lymphadenopathy on exam), able to visualize several white patches at back of throat onto soft palate.  Throat only mildly erythremic. She still c/o wet cough but unable to raise sputum for sample, Denies SOB.  Denies feeling feverishness/chills. Slightly better oral intake, no N/V/D.  Increased abdominal pain today, pointing adjacent/medial to PEG as location.   Daughter at bedside.     ROS: as above.     Current Meds:  Scheduled Meds:   methadone  10 mg Oral BID    methadone  7.5 mg Oral Daily    piperacillin-tazobactam  4.5 g Intravenous Q6H    guaiFENesin  600 mg Oral 2 times per day    pregabalin  75 mg Oral 2 times per day    sodium chloride  2 g Per G Tube TID PC    metoclopramide  10 mg Oral TID AC    levothyroxine  150 mcg Oral Daily @ 0600    enoxaparin  40 mg Subcutaneous Daily @ 2100    linezolid  600 mg Oral 2 times per day    sennosides  17.6 mg Oral 2 times per day    PROSOURCE NO CARB  30 mL Oral Daily with breakfast    polyethylene glycol  17 g Oral Daily    lactobacillus rhamnosus (GG)  1 capsule Oral Daily    bisacodyl  10 mg Rectal Daily    DULoxetine  20 mg Oral Q24H    lidocaine  1 patch Transdermal Q24H     Continuous Infusions:    PRN Meds:.   calcium carbonate  1,000 mg Oral BID PRN    HYDROmorphone PF  0.5 mg Intravenous Q1H PRN    Or    HYDROmorphone PF  1 mg Intravenous Q1H PRN    LORazepam  0.5 mg Oral Q6H PRN    heparin lock flush  50 Units Intracatheter PRN    ondansetron  4 mg Intravenous Q6H PRN     Objective:  BP:  (92-118)/(52-78)   Temp:  [36 C (96.8 F)-36.8 C (98.2 F)]   Temp src: Temporal (05/31 1108)  Heart Rate:  [82-99]   Resp:  [16-20]   SpO2:  [94 %-99 %]     Physical Exam:              General Appearance: in bed, grimacing, states abdomen hurts.   HEENT: MMM, white patches in posterior oropharynx   Pulm: Fewer crackles  Left base on exam but less air movement in area as well.  WOB unlabored on 2-3 litters, O2 sat 98% at time of visit.   CV: RRR, no M/R/G, bilateral lower extremity edema persists/unchanged  Extremities: WWP  Abdomen: overall soft,  mildly distended, TTP RLQ and LUQ, PEG site clean, minimal erythema, no drainage. .    Skin: warm/dry, no rashes    Neuro: oriented to person/place, mildly confused (asking her daughter Amanda Galloway where Amanda Galloway's brother was - who is in [place] currently).   Lines: PICC LUE (4/11) site benign.       Recent Labs  Lab 08/01/17  2353 07/31/17  2344 07/31/17  0925   WBC 7.2 8.5 12.7*   Hemoglobin 6.9* 7.3* 8.3*   Hematocrit 23* 23* 26*   Platelets 257 235 295   Seg Neut % 67.6 81.8 86.8   Lymphocyte % 7.2 5.0 3.8   Monocyte % 6.9 6.9 7.1   Eosinophil % 17.2 5.5 1.5       Recent Labs  Lab 08/01/17  2353 07/31/17  2344 07/31/17  0014   Sodium 136 135 134   Potassium 3.9 4.2 4.6   CO2 _0 UN _1 Creatinine 0.68 0.67 0.62   Glucose 127* 101* 124*   Calcium 8.6 8.5* 8.6         Lab results: 08/01/17  2353 07/31/17  2344 07/31/17  0014 07/30/17  0014 07/29/17  0019   Total Protein 6.7 6.5 6.7 7.4 6.6   Albumin 2.9* 2.8* 2.8* 3.2* 2.8*   ALT 190* 256* 277* 233* 113*   AST 242* 430* 543* 388* 184*   Alk Phos 1,195* 1,444* 1,662* 1,509* 1,241*   Bilirubin,Total 1.4* 2.1* 0.7 0.2 0.3       Lab Results  Component Value Date/Time   CRP 63 (H) 07/29/2017 0019   CRP 54 (H) 07/19/2017 0107   CRP 46 (H) 07/12/2017 0029       Lab Results  Component Value Date/Time   Sedimentation Rate 39 (H) 07/29/2017 0019   Sedimentation Rate 40 (H) 07/19/2017 0107   Sedimentation Rate >=130  (!) 07/12/2017 0029       Micro:   1/25: Intraoperative bile: GS 0 pmns, no organisms, Cx: no growth  2/12: 1 set of blood cultures from the right Mediport (no peripheral bld was sent): positive for Enterobacter cloacae complex in 70 hrs, sensitive to zosyn.   2/18 abdominal GS had >25 PMNs, many GPC in pairs/chains, many GNB and cultures 4+ Enterobacter cloacae complex. The cultures also grew 1+ Candida glabrata   2/21: BCx from the Right peripheral IV, Mediport, L IJ:  No growth  05/03/17: IR-guided I&D with drain placement of the left upper quadrant collection: GS > 25 PMNs, many GNB and GN diplococci, Cultures with 4+ Enterobacter cloacae complex (resistant to Zosyn and CTX), C. Albicans and C. Glabrata, and Candida tropicalis  05/04/17:    1 set of blood cultures from left IJ: VRE at 15.3 TTP. Daptomycin sensitivities: MIC 52mg/ml (dose dependent),  Linezolid   1 set from periphery: VRE and Enterobacter cloacae at 14.8 TTP   05/05/17:  IR-guided I&D with drain placement of the intrahepatic collection: GS 1-10 PMNs, very few GPC in pairs. Fungal cultures with Candida glabrata  05/05/17: Blood cultures from periphery, Mediport and IJ: no growth  05/04/17: Urine cultures obtained (for unclear reason): no growth  05/08/17: Abscess GS: >25 PMNs, many GPC in chains, few GNB. Aerobic cultures growing 2+ Enterobacter (ertapenem, Cipro, TMP/SMX-sensitive) and 3+VRE, Fungal: C. Glabrata (sens to caspo, vori and dose dependent sens to fluconazole)  06/06/17: 2 sets of BCx (1 set each from periphery and Mediport) - taken after restarting ertapenem: IVAD grew VRE ttp 18.2 hours,  Periphery (Left arm) grew VRE ttp 17.3.  06/07/17 Pleural fluid (left) GS 1-10 PMN's, 1-10 Nucleated white cells, No organisms seen and 1604 nucleated cells. No growth on culture.   06/07/17 Abdominal abscess: labs cancelled - no specimens received.   06/08/17: Mediport BC + for VRE in 23 hrs (R-Amp, R- tetracycline per  lab, suppressed result, R- doxycycline;   S-Linezolid, S-Dapto dose dependent),  Right hand peripheral cx: No growth   06/10/17: Blood culture 2/2: 1 periphery (RHand), 1 from Mediport before removed: no growth  06/11/17: Blood Culture 2/2 (both periphery) - no growth   06/19/17:  Perigastric fluid #1:  GS 10-25 PMNs -  GPB, GNB, GPC in pairs. 1+ C. Glabrata (R- fluconazole, voriconazole, S- Caspofungin); 2+ Enterococcus faecium (R- amp, PenG, Vanco; S- Linezolid)  06/19/17:  Liver fluid collection:  GS: 0 PMNs, Fungal and AFB stains negative, 1+ enterococcus faecium (R- Amp, PenG, Vanco, S- Dapto dose dependent, Linezolid)  06/19/17 : Perigastric fluid #2: GS: >25 PMNs, Gram negative bacilli; 3+ C. Glabrata.   07/04/17 U/A negative  07/04/17 Blood Cultures 3/3 (1 set PICC proximal port, 2 sets periphery): NGTD  07/04/17 MRSA amplification Nares: negative  07/05/17 Blood Culture 3/3 (2 PICC, 1 Periph) - NGTD  07/05/17 Liver Abscess (IR drain)- <1PMNs, GPC in prs and chains, 1 colony candida albicans (R- fluconazole, S-Caspo, vori)), 4+ Enterococcus faecium - two isolates with one now resistant to Daptomycin, the other dose dependent.   5/24: BCx 2 sets 1 peripheral 1 PICC. Peripheral grew E.coli after 13 hrs (Pansensitive).  5/24: UA no WBCS  5/26: BCx 2 sets 1 peripheral 1 PICC - no growth  5/27 Sputum Gram Stain >10 squamous epithealial cells <25 PMNs - not cultured/contaminated  5/29: BCx 2 sets (1 set periph, 1 set PICC): NGDT  preliminary  5/29 Sputum - not collected   5/29 U/A negative       Imaging/Other Relevant Diagnostics:   5/30 CXR:  There is interval increase in pulmonary edema.    Interval increase in bilateral small pleural effusions with associated compressive atelectasis.     5/29 CXR:   1.  Interval increase in bibasilar airspace opacities, left greater than right, likely related to increasing pleural fluid and atelectasis.   2.  The interstitial markings are mildly prominent consistent with underlying edema      Assessment and Plan:  ID problem(s):   -  E.coli bacteremia: from intraabdominal collections vs. Aspiration pneumonia.  - VRE Bacteremia   - Multiple and extensive Intra-abdominal abscesses growing VRE, enterobacter and Candida glabrata, C.albicans    72 y.o. female with h/o recent PE and pancreatic cancer, s/p whipple procedure (goal of cure), complicated by delayed gastric emptying, elevated LFTs and sepsis, malnutrition 2/2 early satiety and anorexia (requiring PEG), and multiple intraabdominal and hepatic abscesses and drains. Most recent sepsis episode on 5/2 requiring ICU observation/fluids/increased O2 demands, no pressors required, stabilized quickly, source was not definitively identified, returned to [facility] floor on 5/7.  She has since slowly and steadily continued to improve.  The TPN weaned to off, she is tolerating TFs and taking in small amounts of food from regular diet tray.  Her last abdominal drain was pulled today (07/22/17).  Ertapenem was discontinued on 07/18/17 (2/2 LFTs fluctuating upwards and she had not grown out gram negatives in 2 months ).  On 5/24, Amanda Galloway developed some confusion, tachycarida, anorexia (more than baseline), and developed new cough. Blood cultures were obtained from her PICC and periphery. The peripheral stick grew E.coli after 13 hrs. CT of chest and of abdomen/pelvis with contrast were done. Chest CT showed a new small right pleural effusion, unchanged moderate left pleural effusion and progressive consolidative changes right lower lobe most likely representing atelectasis but infection/infection not entirely excluded as well as ground glass opacities in the peripheral  left upper lobe may represent infection or inflammation. Intraabdominal collections stable/slightly decreased. New bacteremia source could be intraabdominal or possibly an aspiration pneumonia. She was restarted on a carbapenem 5/25 (meropenem narrowed to ertapenem) then further narrowed to cefazolin (for E.coli bacteremia) and flagyl (anaerobes in  case of asp pna) yesterday evening.   This morning Amanda Galloway spiked fever to 101.36F and elevated white count to 12.7 (from 7.6) accompanied by confusion, hallucinations, and tachycardia. Also with continuing rise in liver chemistries.  Blood cultures and urine sent, pending sputum sample for culture.   Antibiotics broadened to Zosyn.  CXR demonstrating edema and persistent/increasing plueral fluid L>R. Abdominal US showing two fluid collections which appear relatively stable as compared to A/P CT on 5/24.     Currently Amanda Galloway is c/o a sore throat without F/C, several small whitish patches noted on posterior oropharynx (no vesicles seen), minimally erythematous, some tenderness along right cervical lymph chain, no lymphadenopathy appreciated at this time.  Unlikely this is strep given appearance, afebrile, and she is currently on Pip/Tazo, more likely viral.  Recommend symptomatic relief such as mild salt rinses and moniter, will follow.   CXR shows increase in pleural effusions, received furosemide IV. O2 at 2.5L nc, no SOB.  Transaminases continue to down trend, bump in bili also down trending.     Discussed case with ID attending Dr. William Hamburger, no changes in antibiotics at this time, will continue to monitor pulmonary status anticipating stopping Zosyn early next week if clinically stable.       Recommendations:    Continue Zosyn IV 4.5 gm every 6 hours, duration TBD   Continue Linezolid 600 mg orally twice daily for VRE, duration TBD.    Symptomatic relief for pharyngitis   Follow weekly CBC with diff   Follow every 2-3 day CMP or more often per discretion of primary team.    Follow 5/59 blood cultures   Obtain sputum culture if possible   ID team 3 will continue to follow along.    Lonzo Candy, ANP-BC  Infectious Diseases, Team 3  Pager [phone]  Office Ph [phone]

## 2017-08-03 LAB — CBC AND DIFFERENTIAL
Baso # K/uL: 0.1 10*3/uL (ref 0.0–0.1)
Basophil %: 0.8 %
Eos # K/uL: 0.8 10*3/uL — ABNORMAL HIGH (ref 0.0–0.4)
Eosinophil %: 13.4 %
Hematocrit: 22 % — ABNORMAL LOW (ref 34–45)
Hemoglobin: 6.6 g/dL — ABNORMAL LOW (ref 11.2–15.7)
IMM Granulocytes #: 0 10*3/uL
IMM Granulocytes: 0.5 %
Lymph # K/uL: 0.7 10*3/uL — ABNORMAL LOW (ref 1.2–3.7)
Lymphocyte %: 10.8 %
MCH: 29 pg/cell (ref 26–32)
MCHC: 30 g/dL — ABNORMAL LOW (ref 32–36)
MCV: 96 fL — ABNORMAL HIGH (ref 79–95)
Mono # K/uL: 0.5 10*3/uL (ref 0.2–0.9)
Monocyte %: 8 %
Neut # K/uL: 4.2 10*3/uL (ref 1.6–6.1)
Nucl RBC # K/uL: 0 10*3/uL (ref 0.0–0.0)
Nucl RBC %: 0 /100 WBC (ref 0.0–0.2)
Platelets: 250 10*3/uL (ref 160–370)
RBC: 2.3 MIL/uL — ABNORMAL LOW (ref 3.9–5.2)
RDW: 19 % — ABNORMAL HIGH (ref 11.7–14.4)
Seg Neut %: 66.5 %
WBC: 6.3 10*3/uL (ref 4.0–10.0)

## 2017-08-03 LAB — COMPREHENSIVE METABOLIC PANEL
ALT: 163 U/L — ABNORMAL HIGH (ref 0–35)
AST: 194 U/L — ABNORMAL HIGH (ref 0–35)
Albumin: 2.7 g/dL — ABNORMAL LOW (ref 3.5–5.2)
Alk Phos: 1147 U/L — ABNORMAL HIGH (ref 35–105)
Anion Gap: 11 (ref 7–16)
Bilirubin,Total: 0.7 mg/dL (ref 0.0–1.2)
CO2: 25 mmol/L (ref 20–28)
Calcium: 8.2 mg/dL — ABNORMAL LOW (ref 8.6–10.2)
Chloride: 101 mmol/L (ref 96–108)
Creatinine: 0.61 mg/dL (ref 0.51–0.95)
GFR,Black: 105 *
GFR,Caucasian: 91 *
Glucose: 124 mg/dL — ABNORMAL HIGH (ref 60–99)
Lab: 11 mg/dL (ref 6–20)
Potassium: 3.6 mmol/L (ref 3.3–5.1)
Sodium: 137 mmol/L (ref 133–145)
Total Protein: 6.2 g/dL — ABNORMAL LOW (ref 6.3–7.7)

## 2017-08-03 LAB — BILIRUBIN, DIRECT: Bilirubin,Direct: 0.6 mg/dL — ABNORMAL HIGH (ref 0.0–0.3)

## 2017-08-03 MED ORDER — FUROSEMIDE 20 MG PO TABS *I*
10.0000 mg | ORAL_TABLET | Freq: Two times a day (BID) | ORAL | Status: AC
Start: 2017-08-03 — End: 2017-08-04
  Administered 2017-08-03 – 2017-08-04 (×4): 10 mg via ORAL
  Filled 2017-08-03 (×4): qty 1

## 2017-08-03 MED ORDER — HYDROMORPHONE HCL 2 MG/ML IJ SOLN *WRAPPED*
0.5000 mg | Freq: Once | INTRAMUSCULAR | Status: AC
Start: 2017-08-03 — End: 2017-08-03
  Administered 2017-08-03: 0.5 mg via INTRAVENOUS
  Filled 2017-08-03: qty 1

## 2017-08-03 NOTE — Plan of Care (Signed)
Bowel Elimination     Elimination patterns are normal or improving Progressing towards goal        Cognitive function     Cognitive function will be maintained Progressing towards goal     Cognitive function will be maintained Progressing towards goal     Cognitive function will be maintained or return to baseline Progressing towards goal        Fluid and Electrolyte Imbalance     Fluid and Electrolyte imbalance Progressing towards goal        GI Bleeding Elimination     Elimination of patterns are normal or improving Progressing towards goal        Mobility     Functional status is maintained or improved - Geriatric Progressing towards goal     Patient's functional status is maintained or improved Progressing towards goal        Nutrition     Patient's nutritional status is maintained or improved Progressing towards goal     Nutritional status is maintained or improved - Geriatric Progressing towards goal        Pain/Comfort     Patient's pain or discomfort is manageable Progressing towards goal     Patient's pain or discomfort is manageable Progressing towards goal        Post-Operative Bladder Elimination     Patient is able to empty bladder or return to baseline Progressing towards goal        Post-Operative Bowel Elimination     Elimination pattern is normal or improving Progressing towards goal        Post-Operative Complications     Prevent post-operative complications Progressing towards goal        Post-Operative Hemodynamic Stability     Maintain Hemodynamic Stability Progressing towards goal        Psychosocial     Demonstrates ability to cope with illness Progressing towards goal        Safety     Patient will remain free of falls Progressing towards goal

## 2017-08-03 NOTE — Progress Notes (Signed)
Surgical Oncology Surgery Progress Note    Patient: Kadynce Bonds    LOS: 127 days    Attending: [place]. AFVSS.  Pain controlled  Tolerating PO, TFs resumed overnight, no episodes of G tube leaking overnight  Voiding spontaneously   +OOB/ambulating   Endorsing a worsening cough this AM, denies SOB, CP.  Alk phos 1147( 1195)  ALT 163(190)   AST 194(242)   TB 0.7 (1.4)       OBJECTIVE  Physical Exam:  Temp:  [36 C (96.8 F)-36.6 C (97.9 F)] 36.2 C (97.2 F)  Heart Rate:  [89-99] 94  Resp:  [16-18] 16  BP: (98-118)/(60-78) 118/64   GEN: no apparent distress  HEENT: NCAT, face symmetric  CHEST: nonlabored respirations on 2LNC   ABD: soft, mildly distended, minimally tender surrounding G-tube that is CDI, minimal erythema, without active leaking   NEURO/MOTOR: alert, appropriate  EXTREMITIES: atraumatic  VASCULAR: feet wwp, 1+ peripheral edema in BLE    Intake/Output:  05/31 0700 - 06/01 0659  In: 2495 [P.O.:1310]  Out: 1410 [Urine:1410]     Medications:  Current Facility-Administered Medications   Medication    miconazole (MICATIN) 2 % powder    methadone (DOLOPHINE) tablet 10 mg    methadone (DOLOPHINE) tablet 7.5 mg    HYDROmorphone (DILAUDID) tablet 8 mg    ondansetron (ZOFRAN) injection 4 mg    alteplase (CATHFLO,ACTIVASE) injection 0.5-2 mg    HYDROmorphone (DILAUDID) injection 0.5 mg    Or    HYDROmorphone (DILAUDID) injection 1 mg    piperacillin-tazobactam (ZOSYN) IVPB 4.5 g    guaiFENesin (MUCINEX) 12 hr tablet 600 mg    pregabalin (LYRICA) capsule 75 mg    sodium chloride tablet 2 g    metoclopramide (REGLAN) tablet 10 mg    levothyroxine (SYNTHROID, LEVOTHROID) tablet 150 mcg    enoxaparin (LOVENOX) injection 40 mg    linezolid (ZYVOX) tablet 600 mg    sennosides (SENOKOT) 8.8 MG/5ML syrup 17.6 mg    PROSOURCE NO CARB liquid LIQD 30 mL    albuterol (PROVENTIL) nebulization 2.5 mg    polyethylene glycol (GLYCOLAX,MIRALAX) powder 17 g     lactobacillus rhamnosus (GG) (CULTURELLE) capsule 1 each    methyl salicylate-menthol (BENGAY) topical cream    bisacodyl (DULCOLAX) suppository 10 mg    DULoxetine (CYMBALTA) DR capsule 20 mg    naloxone (NARCAN) 0.4 mg/mL injection 0.1 mg    mineral oil-hydrophilic petrolatum (AQUAPHOR) ointment    sodium chloride 0.9 % flush 10 mL    sodium chloride 0.9 % flush 10 mL    sodium chloride 0.9 % FLUSH REQUIRED IF PATIENT HAS IV    dextrose 5 % FLUSH REQUIRED IF PATIENT HAS IV    lidocaine (LIDODERM) 5 % patch 1 patch     Facility-Administered Medications Ordered in Other Encounters   Medication    etomidate (AMIDATE) 2 mg/mL injection    rocuronium (ZEMURON) 10 mg/mL injection         Laboratory values:   Recent Labs      08/03/17   0208  08/01/17   2353   WBC  6.3  7.2   Hemoglobin  6.6*  6.9*   Hematocrit  22*  23*   Platelets  250  257   INR   --   1.3*     No components found with this basename: APTT, PT Recent Labs  0208  08/01/17   2353  07/31/17   2344   Sodium  137  136  135   Potassium  3.6  3.9  4.2   Chloride  101  99  97   CO2  25  25  25   UN  11  14  14   Creatinine  0.61  0.68  0.67   Glucose  124*  127*  101*   Calcium  8.2*  8.6  8.5*   Magnesium   --   1.8  1.7   Phosphorus   --   3.9  4.2    Recent Labs      08/03/17   0208  08/01/17   2353   AST  194*  242*   ALT  163*  190*   Alk Phos  1,147*  1,195*   Bilirubin,Total  0.7  1.4*   Bilirubin,Direct  0.6*  1.2*     Recent Labs      08/03/17   0208  08/01/17   2353   Total Protein  6.2*  6.7   Albumin  2.7*  2.9*     No results for input(s): AMY, LIP in the last 72 hours.   GLUCOSE:   No results for input(s): PGLU in the last 72 hours.  Imaging: * Abdomen Standard Ap Single View/kub    Result Date: 08/01/2017  Interval decrease in large bowel distention when compared to prior imaging. Nonspecific bowel gas pattern. END OF IMPRESSION I have personally reviewed the images and the Resident's/Fellow's interpretation and agree  with or edited the findings. [facility] submits this DICOM format image data and final report to the [place] [facility], an independent secure electronic health information exchange, on a reciprocally searchable basis (with patient authorization) for a minimum of 12 months after exam date.    Abdomen, Tube Check    Result Date: 08/01/2017  Gastrostomy tube in appropriate position within the stomach with prompt gastric emptying. There is no leak or extraluminal contrast extravasation seen. No contrast is seen tracking along the catheter to the skin. END OF IMPRESSION [facility] submits this DICOM format image data and final report to the [place] [facility], an independent secure electronic health information exchange, on a reciprocally searchable basis (with patient authorization) for a minimum of 12 months after exam date.    *chest Standard Single View    Result Date: 08/02/2017  There is interval increase in pulmonary edema. Interval increase in bilateral small pleural effusions with associated compressive atelectasis. END OF IMPRESSION I have personally reviewed the images and the Resident's/Fellow's interpretation and agree with or edited the findings. [facility] submits this DICOM format image data and final report to the [place] [facility], an independent secure electronic health information exchange, on a reciprocally searchable basis (with patient authorization) for a minimum of 12 months after exam date.    Chest Single Frontal View    Result Date: 07/31/2017  1.  Interval increase in bibasilar airspace opacities, left greater than right, likely related to increasing pleural fluid and atelectasis. 2.  The interstitial markings are mildly prominent consistent with underlying edema. END OF IMPRESSION [facility] submits this DICOM format image data and final report to the [place] [facility], an independent secure electronic health information exchange, on a reciprocally searchable basis (with patient authorization) for a minimum  of 12 months after exam date.    Portable Us Abdomen Limited Single Quad    Result Date: 07/31/2017  Two intrahepatic   collections are noted in the right lobe, similar in size compared to recent CT dated 07/26/2016. One is intrahepatic and may be an infarct or an abscess or hematoma. The other is subcapsular and is likely a hematoma. END OF IMPRESSION. I have personally reviewed the images and the Resident's/Fellow's interpretation and agree with or edited the findings. [facility] submits this DICOM format image data and final report to the [place] [facility], an independent secure electronic health information exchange, on a reciprocally searchable basis (with patient authorization) for a minimum of 12 months after exam date.       ASSESSMENT  Kaytlyn Symonette is a 72 y.o. female with h/o recent PE and pancreatic cancer POD # 102  status post whipple procedure complicated by delayed gastric emptying.  NGT replaced on 04/04/17. UGI on 04/12/17 concerning for obstruction just beyond the GJ in the efferent limb.  Course complicated on 04/16/17 by rising LFTs and sepsis with CT concerning for cholangitis given biliary tree obstruction and PV compression due to hematoma and afferent limb distention in setting of PE treatment. Is s/p IR PTC on 04/16/17 and IR percutaneous aspiration of anterior abdominal fluid collection on 04/24/17. IR LUQ perc drain placement 3/1. IR anterior abdominal 8fr perc drain placement 3/3. IR anterior perc drain into left hepatic collection 3/6. Developed E. Coli bacteremia on 07/26/17 presumed secondary to aspiration pneumonia.    PLAN  · ALP and transaminitis and TB downtrending, will continue to monitor   · Continue Antibiotics per ID, IV zosyn and PO linezolid, appreciate recs   Continue regular diet and TFs at goal overnight   Vent G-tube PRN nausea  C/w aggressive pulmonary toilet.   Bowel regimen  · Analgesia with PO pain. Appreciate  palliative care assistance  · Heparin gtt stopped 5/20. Remains on  ppx lovenox for now  · OOB/ambulate, PT/OT  · Dispo: SNF pending clinical improvment     Author:  F , MD as of: 08/03/2017  at: 7:49 AM

## 2017-08-03 NOTE — Plan of Care (Signed)
Problem: Safety  Goal: Patient will remain free of falls  Outcome: Maintaining      Problem: Pain/Comfort  Goal: Patient's pain or discomfort is manageable  Outcome: Progressing towards goal    Goal: Patient's pain or discomfort is manageable  Outcome: Progressing towards goal      Problem: Mobility  Goal: Functional status is maintained or improved - Geriatric  Outcome: Progressing towards goal    Goal: Patient's functional status is maintained or improved  Outcome: Progressing towards goal      Problem: Nutrition  Goal: Patient's nutritional status is maintained or improved  Outcome: Progressing towards goal    Goal: Nutritional status is maintained or improved - Geriatric  Outcome: Maintaining      Problem: Cognitive function  Goal: Cognitive function will be maintained  Outcome: Maintaining    Goal: Cognitive function will be maintained  Outcome: Maintaining    Goal: Cognitive function will be maintained or return to baseline  Outcome: Maintaining      Problem: Psychosocial  Goal: Demonstrates ability to cope with illness  Outcome: Maintaining

## 2017-08-04 ENCOUNTER — Other Ambulatory Visit: Payer: Self-pay | Admitting: Cardiology

## 2017-08-04 LAB — CBC AND DIFFERENTIAL
Baso # K/uL: 0.1 10*3/uL (ref 0.0–0.1)
Basophil %: 0.9 %
Eos # K/uL: 0.6 10*3/uL — ABNORMAL HIGH (ref 0.0–0.4)
Eosinophil %: 9 %
Hematocrit: 22 % — ABNORMAL LOW (ref 34–45)
Hemoglobin: 6.8 g/dL — ABNORMAL LOW (ref 11.2–15.7)
IMM Granulocytes #: 0 10*3/uL
IMM Granulocytes: 0.3 %
Lymph # K/uL: 0.9 10*3/uL — ABNORMAL LOW (ref 1.2–3.7)
Lymphocyte %: 13.4 %
MCH: 30 pg/cell (ref 26–32)
MCHC: 32 g/dL (ref 32–36)
MCV: 95 fL (ref 79–95)
Mono # K/uL: 0.6 10*3/uL (ref 0.2–0.9)
Monocyte %: 8.3 %
Neut # K/uL: 4.5 10*3/uL (ref 1.6–6.1)
Nucl RBC # K/uL: 0 10*3/uL (ref 0.0–0.0)
Nucl RBC %: 0 /100 WBC (ref 0.0–0.2)
Platelets: 251 10*3/uL (ref 160–370)
RBC: 2.3 MIL/uL — ABNORMAL LOW (ref 3.9–5.2)
RDW: 19.4 % — ABNORMAL HIGH (ref 11.7–14.4)
Seg Neut %: 68.1 %
WBC: 6.7 10*3/uL (ref 4.0–10.0)

## 2017-08-04 LAB — COMPREHENSIVE METABOLIC PANEL
ALT: 147 U/L — ABNORMAL HIGH (ref 0–35)
AST: 175 U/L — ABNORMAL HIGH (ref 0–35)
Albumin: 2.7 g/dL — ABNORMAL LOW (ref 3.5–5.2)
Alk Phos: 1120 U/L — ABNORMAL HIGH (ref 35–105)
Anion Gap: 13 (ref 7–16)
Bilirubin,Total: 0.7 mg/dL (ref 0.0–1.2)
CO2: 26 mmol/L (ref 20–28)
Calcium: 8.2 mg/dL — ABNORMAL LOW (ref 8.6–10.2)
Chloride: 100 mmol/L (ref 96–108)
Creatinine: 0.59 mg/dL (ref 0.51–0.95)
GFR,Black: 107 *
GFR,Caucasian: 92 *
Glucose: 125 mg/dL — ABNORMAL HIGH (ref 60–99)
Lab: 12 mg/dL (ref 6–20)
Potassium: 3.9 mmol/L (ref 3.3–5.1)
Sodium: 139 mmol/L (ref 133–145)
Total Protein: 6.3 g/dL (ref 6.3–7.7)

## 2017-08-04 LAB — AFB CULTURE
AFB Culture: 0
AFB Culture: 0
AFB Culture: 0

## 2017-08-04 LAB — TYPE AND SCREEN
ABO RH Blood Type: A NEG
Antibody Screen: NEGATIVE

## 2017-08-04 LAB — STREP A CULTURE, THROAT: Group A Strep Throat Culture: 0

## 2017-08-04 LAB — BILIRUBIN, DIRECT: Bilirubin,Direct: 0.4 mg/dL — ABNORMAL HIGH (ref 0.0–0.3)

## 2017-08-04 MED ORDER — SODIUM CHLORIDE 0.9 % 100 ML IV SOLN *I*
12.5000 mg | Freq: Four times a day (QID) | INTRAVENOUS | Status: DC | PRN
Start: 2017-08-04 — End: 2017-08-20
  Administered 2017-08-04 – 2017-08-15 (×7): 12.5 mg via INTRAVENOUS
  Filled 2017-08-04 (×7): qty 1

## 2017-08-04 MED ORDER — SODIUM CHLORIDE 0.9 % IV SOLN WRAPPED *I*
3.0000 mL/h | Status: DC
Start: 2017-08-04 — End: 2017-08-13
  Administered 2017-08-10 – 2017-08-13 (×8): 3 mL/h via INTRAVENOUS

## 2017-08-04 NOTE — Plan of Care (Signed)
Bowel Elimination     Elimination patterns are normal or improving Maintaining        Cognitive function     Cognitive function will be maintained Maintaining     Cognitive function will be maintained Maintaining     Cognitive function will be maintained or return to baseline Maintaining        Fluid and Electrolyte Imbalance     Fluid and Electrolyte imbalance Maintaining        GI Bleeding Elimination     Elimination of patterns are normal or improving Maintaining        Mobility     Functional status is maintained or improved - Geriatric Maintaining     Patient's functional status is maintained or improved Maintaining        Nutrition     Patient's nutritional status is maintained or improved Maintaining     Nutritional status is maintained or improved - Geriatric Maintaining        Pain/Comfort     Patient's pain or discomfort is manageable Maintaining     Patient's pain or discomfort is manageable Maintaining        Post-Operative Bladder Elimination     Patient is able to empty bladder or return to baseline Maintaining        Post-Operative Bowel Elimination     Elimination pattern is normal or improving Maintaining        Post-Operative Complications     Prevent post-operative complications Maintaining        Post-Operative Hemodynamic Stability     Maintain Hemodynamic Stability Maintaining        Psychosocial     Demonstrates ability to cope with illness Maintaining        Safety     Patient will remain free of falls Maintaining

## 2017-08-04 NOTE — Progress Notes (Signed)
Small amt of TF leakage noted around PEG site incertion site. Amanda Monday, NP notified. Cavilon and new split gauze applied.

## 2017-08-04 NOTE — Plan of Care (Signed)
Bowel Elimination    • Elimination patterns are normal or improving Goal not met        Cognitive function    • Cognitive function will be maintained Goal not met    • Cognitive function will be maintained Goal not met    • Cognitive function will be maintained or return to baseline Goal not met        Fluid and Electrolyte Imbalance    • Fluid and Electrolyte imbalance Goal not met        GI Bleeding Elimination    • Elimination of patterns are normal or improving Goal not met        Mobility    • Functional status is maintained or improved - Geriatric Goal not met    • Patient's functional status is maintained or improved Goal not met        Nutrition    • Patient's nutritional status is maintained or improved Goal not met    • Nutritional status is maintained or improved - Geriatric Goal not met        Pain/Comfort    • Patient's pain or discomfort is manageable Goal not met    • Patient's pain or discomfort is manageable Goal not met        Post-Operative Bladder Elimination    • Patient is able to empty bladder or return to baseline Goal not met        Post-Operative Bowel Elimination    • Elimination pattern is normal or improving Goal not met        Post-Operative Complications    • Prevent post-operative complications Goal not met        Post-Operative Hemodynamic Stability    • Maintain Hemodynamic Stability Goal not met        Psychosocial    • Demonstrates ability to cope with illness Goal not met        Safety    • Patient will remain free of falls Goal not met

## 2017-08-04 NOTE — Progress Notes (Signed)
Surgical Oncology Surgery Progress Note    Patient: Amanda Galloway    LOS: 128 days    Attending: [place]. AFVSS. Reports feeling okay this AM. Tolerating PO, TFs. Voiding spontaneously. OOB/ambulating without issue. Good UOP yesterday following lasix x2.  Still has residual cough. Denies f/c, n/v, CP or SOB.      OBJECTIVE  Physical Exam:  Temp:  [36.2 C (97.2 F)-36.8 C (98.2 F)] 36.5 C (97.7 F)  Heart Rate:  [89-97] 97  Resp:  [16] 16  BP: (108-138)/(60-80) 108/60   GEN: no apparent distress  HEENT: NCAT, face symmetric  CHEST: nonlabored respirations on 2LNC   ABD: soft, mildly distended, minimally tender surrounding G-tube that is CDI, minimal erythema, without active leaking   NEURO/MOTOR: alert, appropriate  EXTREMITIES: atraumatic  VASCULAR: feet wwp, 1+ peripheral edema in BLE    Intake/Output:  06/01 0700 - 06/02 0659  In: 3195.8 [P.O.:1400; I.V.:340.6]  Out: 1700 [Urine:1700]     Medications:  Current Facility-Administered Medications   Medication    furosemide (LASIX) tablet 10 mg    miconazole (MICATIN) 2 % powder    methadone (DOLOPHINE) tablet 10 mg    methadone (DOLOPHINE) tablet 7.5 mg    HYDROmorphone (DILAUDID) tablet 8 mg    ondansetron (ZOFRAN) injection 4 mg    alteplase (CATHFLO,ACTIVASE) injection 0.5-2 mg    HYDROmorphone (DILAUDID) injection 0.5 mg    Or    HYDROmorphone (DILAUDID) injection 1 mg    piperacillin-tazobactam (ZOSYN) IVPB 4.5 g    guaiFENesin (MUCINEX) 12 hr tablet 600 mg    pregabalin (LYRICA) capsule 75 mg    sodium chloride tablet 2 g    metoclopramide (REGLAN) tablet 10 mg    levothyroxine (SYNTHROID, LEVOTHROID) tablet 150 mcg    enoxaparin (LOVENOX) injection 40 mg    linezolid (ZYVOX) tablet 600 mg    sennosides (SENOKOT) 8.8 MG/5ML syrup 17.6 mg    PROSOURCE NO CARB liquid LIQD 30 mL    albuterol (PROVENTIL) nebulization 2.5 mg    polyethylene glycol (GLYCOLAX,MIRALAX) powder 17 g    lactobacillus  rhamnosus (GG) (CULTURELLE) capsule 1 each    methyl salicylate-menthol (BENGAY) topical cream    bisacodyl (DULCOLAX) suppository 10 mg    DULoxetine (CYMBALTA) DR capsule 20 mg    naloxone (NARCAN) 0.4 mg/mL injection 0.1 mg    mineral oil-hydrophilic petrolatum (AQUAPHOR) ointment    sodium chloride 0.9 % flush 10 mL    sodium chloride 0.9 % flush 10 mL    sodium chloride 0.9 % FLUSH REQUIRED IF PATIENT HAS IV    dextrose 5 % FLUSH REQUIRED IF PATIENT HAS IV    lidocaine (LIDODERM) 5 % patch 1 patch     Facility-Administered Medications Ordered in Other Encounters   Medication    etomidate (AMIDATE) 2 mg/mL injection    rocuronium (ZEMURON) 10 mg/mL injection         Laboratory values:   Recent Labs      08/04/17   0402  08/03/17   0208  08/01/17   2353   WBC  6.7  6.3  7.2   Hemoglobin  6.8*  6.6*  6.9*   Hematocrit  22*  22*  23*   Platelets  251  250  257   INR   --    --   1.3*     No components found with this basename: APTT, PT  08/04/17   0402  08/03/17   0208  08/01/17   2353   Sodium  139  137  136   Potassium  3.9  3.6  3.9   Chloride  100  101  99   CO2  26  25  25   UN  12  11  14   Creatinine  0.59  0.61  0.68   Glucose  125*  124*  127*   Calcium  8.2*  8.2*  8.6   Magnesium   --    --   1.8   Phosphorus   --    --   3.9    Recent Labs      08/04/17   0402  08/03/17   0208   AST  175*  194*   ALT  147*  163*   Alk Phos  1,120*  1,147*   Bilirubin,Total  0.7  0.7   Bilirubin,Direct  0.4*  0.6*     Recent Labs      08/04/17   0402  08/03/17   0208   Total Protein  6.3  6.2*   Albumin  2.7*  2.7*     No results for input(s): AMY, LIP in the last 72 hours.   GLUCOSE:   No results for input(s): PGLU in the last 72 hours.  Imaging: Abdomen, Tube Check    Result Date: 08/01/2017  Gastrostomy tube in appropriate position within the stomach with prompt gastric emptying. There is no leak or extraluminal contrast extravasation seen. No contrast is seen tracking along the catheter to  the skin. END OF IMPRESSION [facility] submits this DICOM format image data and final report to the [place] [facility], an independent secure electronic health information exchange, on a reciprocally searchable basis (with patient authorization) for a minimum of 12 months after exam date.    *chest Standard Single View    Result Date: 08/02/2017  There is interval increase in pulmonary edema. Interval increase in bilateral small pleural effusions with associated compressive atelectasis. END OF IMPRESSION I have personally reviewed the images and the Resident's/Fellow's interpretation and agree with or edited the findings. [facility] submits this DICOM format image data and final report to the [place] [facility], an independent secure electronic health information exchange, on a reciprocally searchable basis (with patient authorization) for a minimum of 12 months after exam date.       ASSESSMENT  Amanda Galloway is a 72 y.o. female with h/o recent PE and pancreatic cancer POD # 102  status post whipple procedure complicated by delayed gastric emptying.  NGT replaced on 04/04/17. UGI on 04/12/17 concerning for obstruction just beyond the GJ in the efferent limb.  Course complicated on 04/16/17 by rising LFTs and sepsis with CT concerning for cholangitis given biliary tree obstruction and PV compression due to hematoma and afferent limb distention in setting of PE treatment. Is s/p IR PTC on 04/16/17 and IR percutaneous aspiration of anterior abdominal fluid collection on 04/24/17. IR LUQ perc drain placement 3/1. IR anterior abdominal 8fr perc drain placement 3/3. IR anterior perc drain into left hepatic collection 3/6. Developed E. Coli bacteremia on 07/26/17 presumed secondary to aspiration pneumonia.    PLAN  · ALP and transaminitis and TB downtrending, will continue to monitor   · Continue Antibiotics per ID, IV zosyn and PO linezolid, appreciate recs   Continue regular diet and TFs at goal overnight   Vent G-tube PRN nausea  C/w  aggressive pulmonary   toilet.   Bowel regimen  · Analgesia with PO pain. Appreciate  palliative care assistance  · Heparin gtt stopped 5/20. Remains on ppx lovenox for now  · OOB/ambulate, PT/OT  · Dispo: SNF pending clinical improvment     Author:  F , MD as of: 08/04/2017  at: 7:51 AM

## 2017-08-05 LAB — CBC AND DIFFERENTIAL
Baso # K/uL: 0.1 10*3/uL (ref 0.0–0.1)
Basophil %: 1.1 %
Eos # K/uL: 0.5 10*3/uL — ABNORMAL HIGH (ref 0.0–0.4)
Eosinophil %: 6.1 %
Hematocrit: 25 % — ABNORMAL LOW (ref 34–45)
Hemoglobin: 7.9 g/dL — ABNORMAL LOW (ref 11.2–15.7)
IMM Granulocytes #: 0.1 10*3/uL
IMM Granulocytes: 0.6 %
Lymph # K/uL: 1 10*3/uL — ABNORMAL LOW (ref 1.2–3.7)
Lymphocyte %: 12.1 %
MCH: 30 pg/cell (ref 26–32)
MCHC: 32 g/dL (ref 32–36)
MCV: 93 fL (ref 79–95)
Mono # K/uL: 0.7 10*3/uL (ref 0.2–0.9)
Monocyte %: 8.6 %
Neut # K/uL: 5.7 10*3/uL (ref 1.6–6.1)
Nucl RBC # K/uL: 0 10*3/uL (ref 0.0–0.0)
Nucl RBC %: 0 /100 WBC (ref 0.0–0.2)
Platelets: 280 10*3/uL (ref 160–370)
RBC: 2.7 MIL/uL — ABNORMAL LOW (ref 3.9–5.2)
RDW: 20 % — ABNORMAL HIGH (ref 11.7–14.4)
Seg Neut %: 71.5 %
WBC: 8 10*3/uL (ref 4.0–10.0)

## 2017-08-05 LAB — COMPREHENSIVE METABOLIC PANEL
ALT: 128 U/L — ABNORMAL HIGH (ref 0–35)
AST: 131 U/L — ABNORMAL HIGH (ref 0–35)
Albumin: 2.8 g/dL — ABNORMAL LOW (ref 3.5–5.2)
Alk Phos: 1110 U/L — ABNORMAL HIGH (ref 35–105)
Anion Gap: 12 (ref 7–16)
Bilirubin,Total: 0.7 mg/dL (ref 0.0–1.2)
CO2: 29 mmol/L — ABNORMAL HIGH (ref 20–28)
Calcium: 8.5 mg/dL — ABNORMAL LOW (ref 8.6–10.2)
Chloride: 99 mmol/L (ref 96–108)
Creatinine: 0.68 mg/dL (ref 0.51–0.95)
GFR,Black: 102 *
GFR,Caucasian: 88 *
Glucose: 115 mg/dL — ABNORMAL HIGH (ref 60–99)
Lab: 13 mg/dL (ref 6–20)
Potassium: 3.7 mmol/L (ref 3.3–5.1)
Sodium: 140 mmol/L (ref 133–145)
Total Protein: 6.7 g/dL (ref 6.3–7.7)

## 2017-08-05 LAB — RED BLOOD CELLS
Coded Blood type: 600
Component blood type: A NEG
Dispense status: TRANSFUSED

## 2017-08-05 LAB — APTT: aPTT: 38.5 s — ABNORMAL HIGH (ref 25.8–37.9)

## 2017-08-05 LAB — PHOSPHORUS: Phosphorus: 4 mg/dL (ref 2.7–4.5)

## 2017-08-05 LAB — BILIRUBIN, DIRECT: Bilirubin,Direct: 0.4 mg/dL — ABNORMAL HIGH (ref 0.0–0.3)

## 2017-08-05 LAB — TRIGLYCERIDES: Triglycerides: 180 mg/dL — AB

## 2017-08-05 LAB — PREALBUMIN: Prealbumin: 10 mg/dL — ABNORMAL LOW (ref 20–40)

## 2017-08-05 LAB — MAGNESIUM: Magnesium: 1.8 mg/dL (ref 1.6–2.5)

## 2017-08-05 LAB — PROTIME-INR
INR: 1.1 (ref 0.9–1.1)
Protime: 12.9 s (ref 10.0–12.9)

## 2017-08-05 LAB — LIPASE: Lipase: 17 U/L (ref 13–60)

## 2017-08-05 LAB — CRP: CRP: 48 mg/L — ABNORMAL HIGH (ref 0–10)

## 2017-08-05 LAB — SEDIMENTATION RATE, AUTOMATED: Sedimentation Rate: 101 mm/hr — ABNORMAL HIGH (ref 0–30)

## 2017-08-05 MED ORDER — FUROSEMIDE 20 MG PO TABS *I*
20.0000 mg | ORAL_TABLET | Freq: Once | ORAL | Status: AC
Start: 2017-08-05 — End: 2017-08-05
  Administered 2017-08-05: 20 mg via ORAL
  Filled 2017-08-05: qty 1

## 2017-08-05 MED ORDER — FUROSEMIDE 20 MG PO TABS *I*
10.0000 mg | ORAL_TABLET | Freq: Two times a day (BID) | ORAL | Status: DC
Start: 2017-08-06 — End: 2017-08-20
  Administered 2017-08-06 – 2017-08-20 (×29): 10 mg via ORAL
  Filled 2017-08-05 (×31): qty 1

## 2017-08-05 MED ORDER — LIDOCAINE 5 % EX PTCH *I*
1.0000 | MEDICATED_PATCH | CUTANEOUS | Status: DC
Start: 2017-08-05 — End: 2017-08-24
  Administered 2017-08-05 – 2017-08-23 (×19): 1 via TRANSDERMAL
  Filled 2017-08-05 (×19): qty 1

## 2017-08-05 MED ORDER — SENNOSIDES 8.6 MG PO TABS *I*
2.0000 | ORAL_TABLET | Freq: Two times a day (BID) | ORAL | Status: DC
Start: 2017-08-05 — End: 2017-08-24
  Administered 2017-08-05 – 2017-08-23 (×25): 2 via ORAL
  Filled 2017-08-05 (×29): qty 2

## 2017-08-05 MED ORDER — NYSTATIN 100000 UNIT/ML MT SUSP *I*
5.0000 mL | Freq: Four times a day (QID) | OROMUCOSAL | Status: DC
Start: 2017-08-05 — End: 2017-08-05
  Filled 2017-08-05 (×6): qty 5

## 2017-08-05 NOTE — Progress Notes (Signed)
Surgical Oncology Surgery Progress Note    Patient: Amanda Galloway    LOS: 129 days    Attending: [place]. AFVSS. Resting comfortably this AM. No complaints.      OBJECTIVE  Physical Exam:  Temp:  [36.3 C (97.3 F)-36.6 C (97.9 F)] 36.5 C (97.7 F)  Heart Rate:  [86-99] 90  Resp:  [12-18] 12  BP: (98-124)/(54-70) 112/62   GEN: no apparent distress  HEENT: NCAT, face symmetric  CHEST: nonlabored respirations on 2LNC   ABD: soft, mildly distended, minimally tender surrounding G-tube that is CDI, minimal erythema, without active leaking   NEURO/MOTOR: alert, appropriate  EXTREMITIES: atraumatic  VASCULAR: feet wwp, 1+ peripheral edema in BLE    Intake/Output:  06/02 0700 - 06/03 0659  In: 3031.9 [P.O.:980; I.V.:296.1]  Out: 1050 [Urine:1050]     Medications:  Current Facility-Administered Medications   Medication    nystatin (MYCOSTATIN) 100000 unit/mL suspension 500,000 Units    sodium chloride 0.9 % IV    promethazine (PHENERGAN) 12.5 mg in sodium chloride 0.9% 25 mL IVPB    miconazole (MICATIN) 2 % powder    methadone (DOLOPHINE) tablet 10 mg    methadone (DOLOPHINE) tablet 7.5 mg    HYDROmorphone (DILAUDID) tablet 8 mg    ondansetron (ZOFRAN) injection 4 mg    alteplase (CATHFLO,ACTIVASE) injection 0.5-2 mg    HYDROmorphone (DILAUDID) injection 0.5 mg    Or    HYDROmorphone (DILAUDID) injection 1 mg    piperacillin-tazobactam (ZOSYN) IVPB 4.5 g    guaiFENesin (MUCINEX) 12 hr tablet 600 mg    pregabalin (LYRICA) capsule 75 mg    sodium chloride tablet 2 g    metoclopramide (REGLAN) tablet 10 mg    levothyroxine (SYNTHROID, LEVOTHROID) tablet 150 mcg    enoxaparin (LOVENOX) injection 40 mg    linezolid (ZYVOX) tablet 600 mg    sennosides (SENOKOT) 8.8 MG/5ML syrup 17.6 mg    PROSOURCE NO CARB liquid LIQD 30 mL    albuterol (PROVENTIL) nebulization 2.5 mg    polyethylene glycol (GLYCOLAX,MIRALAX) powder 17 g    lactobacillus rhamnosus (GG)  (CULTURELLE) capsule 1 each    methyl salicylate-menthol (BENGAY) topical cream    bisacodyl (DULCOLAX) suppository 10 mg    DULoxetine (CYMBALTA) DR capsule 20 mg    naloxone (NARCAN) 0.4 mg/mL injection 0.1 mg    mineral oil-hydrophilic petrolatum (AQUAPHOR) ointment    sodium chloride 0.9 % flush 10 mL    sodium chloride 0.9 % flush 10 mL    sodium chloride 0.9 % FLUSH REQUIRED IF PATIENT HAS IV    dextrose 5 % FLUSH REQUIRED IF PATIENT HAS IV    lidocaine (LIDODERM) 5 % patch 1 patch     Facility-Administered Medications Ordered in Other Encounters   Medication    etomidate (AMIDATE) 2 mg/mL injection    rocuronium (ZEMURON) 10 mg/mL injection         Laboratory values:   Recent Labs      08/05/17   0045  08/04/17   0402   WBC  8.0  6.7   Hemoglobin  7.9*  6.8*   Hematocrit  25*  22*   Platelets  280  251   INR  1.1   --    Sedimentation Rate  101*   --    CRP  48*   --      No components found with this basename: APTT, PT Recent Labs  08/05/17   0045  08/04/17   0402   Sodium  140  139   Potassium  3.7  3.9   Chloride  99  100   CO2  29*  26   UN  13  12   Creatinine  0.68  0.59   Glucose  115*  125*   Calcium  8.5*  8.2*   Magnesium  1.8   --    Phosphorus  4.0   --     Recent Labs      08/05/17   0045  08/04/17   0402   AST  131*  175*   ALT  128*  147*   Alk Phos  1,110*  1,120*   Bilirubin,Total  0.7  0.7   Bilirubin,Direct  0.4*  0.4*     Recent Labs      08/05/17   0045  08/04/17   0402   Total Protein  6.7  6.3   Albumin  2.8*  2.7*   Prealbumin  10*   --      Recent Labs      08/05/17   0045   Lipase  17      GLUCOSE:   No results for input(s): PGLU in the last 72 hours.  Imaging: No results found.     ASSESSMENT  Amanda Galloway is a 72 y.o. female with h/o recent PE and pancreatic cancer POD # 102  status post whipple procedure complicated by delayed gastric emptying.  NGT replaced on 04/04/17. UGI on 04/12/17 concerning for obstruction just beyond the [place] in the efferent limb.  Course  complicated on 9/31/12 by rising LFTs and sepsis with CT concerning for cholangitis given biliary tree obstruction and PV compression due to hematoma and afferent limb distention in setting of PE treatment. Is s/p IR PTC on 04/16/17 and IR percutaneous aspiration of anterior abdominal fluid collection on 04/24/17. IR LUQ perc drain placement 3/1. IR anterior abdominal 96f perc drain placement 3/3. IR anterior perc drain into left hepatic collection 3/6. Developed E. Coli bacteremia on 07/26/17 presumed secondary to aspiration pneumonia.    PLAN   ALP and transaminitis and TB downtrending, will continue to monitor    Continue Antibiotics per ID, IV zosyn and PO linezolid, appreciate recs   Continue regular diet and TFs at goal overnight   Vent G-tube PRN nausea  C/w aggressive pulmonary toilet.   Bowel regimen   Analgesia with PO pain. Appreciate  palliative care assistance   Heparin gtt stopped 5/20. Remains on ppx lovenox for now   OOB/ambulate, PT/OT   Dispo: SNF pending clinical improvment     Author: MMelburn Hake MD as of: 08/05/2017  at: 8:25 AM

## 2017-08-05 NOTE — Progress Notes (Signed)
Palliative Care Progress Note    HPI: [name] w/ pancreatic CA s/p Whipple 1/25 (treatment for cure) w/ prolonged complicated hospitalization issues w/ infection, nutrition/appetite/nausea/constipation, s/p PEG w/ perf now resolved, most recent CT abd 5/16 showed no new fluid collections and decrease in existing hepatic collections. Pt resumed methadone on 5/8 and weaning off her dilaudid PCA 5/23. All drains now removed, receiving TFs at night to optimize appetite during day. Referrals for SNF rehab pending. Pt w/ signs of sepsis 5/24 & +BC w/ RLL consolidation on CT concerning for asp pna, abd CT w/o acute changes. Pt had improved w/ broadening of abx then developed fever 5/28 requiring addit WU and abx switches.    Subjective: "I feel like a mack truck hit me. I woke up nauseous this morning and took my meds then threw up 3 times. My pain is ok, about a 4/10. I don't have much of an appetite at all. I don't feel like walking right now"    Objective: chart reviewed, visited w/ pt early afternoon, reports N/V earlier today though did not eat prior to taking meds; pt agreeable to suggestions to have crackers and mandarin oranges then take her afternoon meds, she did well and was able to bolster her spirits some though not willing to go for walk yet, wanting to wait until dtr Adams visiting after she gets out of work, encouraged pt to try later & she did participate in doing leg exercises in chair    Palliative Care ROS:  Pain   Mild  Nausea   Mild  Anorexia   Severe  Anxiety   Moderate  Shortness of Breath   Mild  Tiredness/Fatigue   Moderate  Drowsiness/Sleepiness   Mild  Airway Secretions   no  Constipation   no  Unable to Respond   no   Last stool 6/3     Physical Examination:   BP: (98-124)/(54-70)   Temp:  [36.2 C (97.1 F)-36.7 C (98.1 F)]   Temp src: Temporal (06/03 1117)  Heart Rate:  [86-95]   Resp:  [12-16]   SpO2:  [94 %-98 %]   Gen appearance: older Caucasian female, sitting up in recliner, states  feels exhausted and lacks appetite and energy to walk, using prn dilaudid for abd pain  Lungs: easy resp, cough improved, 1 L NC   Heart: reg  Abd: large, soft, +BS, PEG intact/clamped  Extrem: edematous LEs  Skin:  Dry, less reddened LEs  Neuro: A+O x 3, reports feeling exhausted, struggles w/ N/V when takes pills on empty stomach, at baseline is able to ambulate w/ walker & standby assist    Assessment/Plan:  [name] w/ pancreatic CA s/p Whipple (treatment for cure) w/ prolonged hospitalization. Pt w/ recent set backs r/t infection, on IV abx. In general, pt has been making progress towards dispo with SNF rehab.    Pain/dyspnea  Dilaudid 8 mg po Q 2hrs prn (x 8 on 6/2, x 5 on 6/3)   Dilaudid 1 mg IV Q 2 hrs prn (x 1 on 6/3)  Lyrica 75 mg po BID for fibromyalgia (total 150 mg/d, increased 5/28)  Methadone 7.07/12/08 mg po (total 27.5 mg/d) restarted 5/7, last increase 5/14 (ECG on 5/2 w/ QTc 392)  Albuterol neb Q 6 hrs prn (none used)  Lidocaine patch daily x 2 (abd, LUE)  Acetaminophen 650 mg po Q 6 hrs prn fever   Bengay cream 4x/d prn to joints (x 1 on 6/2)  Guaifenesin  600 mg po BID expectorant    Anxiety/agitation/nausea/fatigue  Cymbalta 20 mg po/d  Metoclopramide 10 mg po TID AC  Ondansetron 4 mg IV Q 6 hrs prn (x 1 on 6/2, x 1 on 6/3) -may benefit from going back to zofran ATC which was stopped 5/29  Promethazine 12.5 mg IV Q 6 hrs prn (x 2 on 6/2)  Ativan 0.25 mg IV Q 6 hrs prn (x 1 on 5/16)  Encouraged complementary adjuvant therapies for coping, meditation app, aromatherapy, gentle massage      Prevention of opioid induced constipation, last stool 6/3  Bisacodyl supp pr daily   Miralax 17 gm po daily  Senna 17.6 mg po BID    Skin/oral care  Aquaphor prn   Eucerin prn for BLEs  Miconazole powder BID under breasts  Nystatin susp oral 4x/d (started 6/3 for sore throat, no visible signs of thrush)    GOC/prognosis  Full code  Dtr Rod Mae is HCP (very supportive and informed, former [facility] RN)  Goal  currently is to prioritize pt's symptom control, long course though pt endorses agreement w/ continuing current treatment plan, dispo to SNF in hopes of eventually making recovery back to independent living    Pt case discussed w/ RN and covering provider made aware of expiring meds. We will continue to follow along. Please call if questions/concerns.    Total Time Spent 40 minutes:             >50% of time was spent in counseling and/or coordination of care.     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    Anemia D64.9    Hypothyroidism E03.9    Pancreatic cancer C25.9    Anemia D64.9     hx of Pulmonary embolism I26.99    Acute pulmonary insufficiency J98.4    Depression F32.9    Sepsis A41.9       No Known Allergies (drug, envir, food or latex)    Scheduled Meds:    nystatin  5 mL Oral 4x Daily    lidocaine  1 patch Transdermal Q24H    [START ON 08/06/2017] furosemide  10 mg Oral BID    miconazole   Topical 2 times per day    methadone  10 mg Oral BID    methadone  7.5 mg Oral Daily    piperacillin-tazobactam  4.5 g Intravenous Q6H    guaiFENesin  600 mg Oral 2 times per day    sodium chloride  2 g Per G Tube TID PC    metoclopramide  10 mg Oral TID AC    levothyroxine  150 mcg Oral Daily @ 0600    enoxaparin  40 mg Subcutaneous Daily @ 2100    linezolid  600 mg Oral 2 times per day    sennosides  17.6 mg Oral 2 times per day    PROSOURCE NO CARB  30 mL Oral Daily with breakfast    polyethylene glycol  17 g Oral Daily    lactobacillus rhamnosus (GG)  1 capsule Oral Daily    bisacodyl  10 mg Rectal Daily    DULoxetine  20 mg Oral Q24H       Continuous Infusions:    sodium chloride  PRN Meds:  promethazine,  HYDROmorphone, ondansetron, alteplase, HYDROmorphone PF **OR** HYDROmorphone PF, albuterol, methyl salicylate-menthol, naloxone, mineral oil-hydrophilic petrolatum, sodium chloride, sodium chloride, sodium chloride, dextrose

## 2017-08-05 NOTE — Progress Notes (Signed)
Infectious Diseases Follow Up Note    Personally reviewed chart, labs, medications. Patient seen.     CC:  F/U s/p VRE bacteremia, liver and abdominal abscesses (VRE, enterobacter, C. glabrata and C. albicans, s/p Enterobacter cloacae bacteremia, and most recently E.coli bacteremia, Asp PNA.     Subjective:  "I threw up 3 times this morning.Amanda KitchenMarland KitchenMornings aren't good".  States she vomited after eating breakfast, vomiting was not preceded by nausea, no nausea at this time. Denies abdominal pain. No diarrhea. Denies F/C, still endorsing wet cough, no longer has sore throat.  Occasional SOB "not that often".  Has 1.5L O2 via nc on.      ROS: as above.     Current Meds:  Scheduled Meds:   nystatin  5 mL Oral 4x Daily    lidocaine  1 patch Transdermal Q24H    [START ON 08/06/2017] furosemide  10 mg Oral BID    furosemide  20 mg Oral Once    miconazole   Topical 2 times per day    methadone  10 mg Oral BID    methadone  7.5 mg Oral Daily    piperacillin-tazobactam  4.5 g Intravenous Q6H    guaiFENesin  600 mg Oral 2 times per day    sodium chloride  2 g Per G Tube TID PC    metoclopramide  10 mg Oral TID AC    levothyroxine  150 mcg Oral Daily @ 0600    enoxaparin  40 mg Subcutaneous Daily @ 2100    linezolid  600 mg Oral 2 times per day    sennosides  17.6 mg Oral 2 times per day    PROSOURCE NO CARB  30 mL Oral Daily with breakfast    polyethylene glycol  17 g Oral Daily    lactobacillus rhamnosus (GG)  1 capsule Oral Daily    bisacodyl  10 mg Rectal Daily    DULoxetine  20 mg Oral Q24H     Continuous Infusions:   sodium chloride       PRN Meds:.   calcium carbonate  1,000 mg Oral BID PRN    HYDROmorphone PF  0.5 mg Intravenous Q1H PRN    Or    HYDROmorphone PF  1 mg Intravenous Q1H PRN    LORazepam  0.5 mg Oral Q6H PRN    heparin lock flush  50 Units Intracatheter PRN    ondansetron  4 mg Intravenous Q6H PRN     Objective:  BP: (98-124)/(54-70)   Temp:  [36.2 C (97.1 F)-36.7 C (98.1 F)]   Temp  src: Temporal (06/03 1117)  Heart Rate:  [86-95]   Resp:  [12-16]   SpO2:  [94 %-98 %]     Physical Exam:              General Appearance: sitting in chair eating fruit, NAD, smiling at times.    HEENT: Sclera anicteric. MMM, OP clear without lesions/exudates.    Pulm: decreased bases, scattered rhonci,  WOB unlabored (O2 1.5L nc).   CV: RRR, no M/R/G, bilateral lower extremity edema unchanged  Extremities: WWP  Abdomen: semi soft, less distended, TTP all quads, PEG site benign.     Skin: warm/dry, no rashes    Neuro: oriented to person/place, appropriate in answers   Lines: PICC LUE (4/11) site benign.       Recent Labs  Lab 08/05/17  0045 08/04/17  0402 08/03/17  0208   WBC 8.0 6.7 6.3   Hemoglobin  7.9* 6.8* 6.6*   Hematocrit 25* 22* 22*   Platelets 280 251 250   Seg Neut % 71.5 68.1 66.5   Lymphocyte % 12.1 13.4 10.8   Monocyte % 8.6 8.3 8.0   Eosinophil % 6.1 9.0 13.4       Recent Labs  Lab 08/05/17  0045 08/04/17  0402 08/03/17  0208   Sodium 140 139 137   Potassium 3.7 3.9 3.6   CO2 29* 26 25   UN '13 12 11   ' Creatinine 0.68 0.59 0.61   Glucose 115* 125* 124*   Calcium 8.5* 8.2* 8.2*         Lab results: 08/05/17  0045 08/04/17  0402 08/03/17  0208 08/01/17  2353 07/31/17  2344   Total Protein 6.7 6.3 6.2* 6.7 6.5   Albumin 2.8* 2.7* 2.7* 2.9* 2.8*   ALT 128* 147* 163* 190* 256*   AST 131* 175* 194* 242* 430*   Alk Phos 1,110* 1,120* 1,147* 1,195* 1,444*   Bilirubin,Total 0.7 0.7 0.7 1.4* 2.1*       Lab Results  Component Value Date/Time   CRP 48 (H) 08/05/2017 0045   CRP 63 (H) 07/29/2017 0019   CRP 54 (H) 07/19/2017 0107       Lab Results  Component Value Date/Time   Sedimentation Rate 101 (H) 08/05/2017 0045   Sedimentation Rate 39 (H) 07/29/2017 0019   Sedimentation Rate 40 (H) 07/19/2017 0107       Micro:   1/25: Intraoperative bile: GS 0 pmns, no organisms, Cx: no growth  2/12: 1 set of blood cultures from the right Mediport (no peripheral bld was sent): positive for Enterobacter cloacae complex in 70  hrs, sensitive to zosyn.   2/18 abdominal GS had >25 PMNs, many GPC in pairs/chains, many GNB and cultures 4+ Enterobacter cloacae complex. The cultures also grew 1+ Candida glabrata   2/21: BCx from the Right peripheral IV, Mediport, L IJ:  No growth  05/03/17: IR-guided I&D with drain placement of the left upper quadrant collection: GS > 25 PMNs, many GNB and GN diplococci, Cultures with 4+ Enterobacter cloacae complex (resistant to Zosyn and CTX), C. Albicans and C. Glabrata, and Candida tropicalis  05/04/17:    1 set of blood cultures from left IJ: VRE at 15.3 TTP. Daptomycin sensitivities: MIC 58mg/ml (dose dependent),  Linezolid   1 set from periphery: VRE and Enterobacter cloacae at 14.8 TTP   05/05/17:  IR-guided I&D with drain placement of the intrahepatic collection: GS 1-10 PMNs, very few GPC in pairs. Fungal cultures with Candida glabrata  05/05/17: Blood cultures from periphery, Mediport and IJ: no growth  05/04/17: Urine cultures obtained (for unclear reason): no growth  05/08/17: Abscess GS: >25 PMNs, many GPC in chains, few GNB. Aerobic cultures growing 2+ Enterobacter (ertapenem, Cipro, TMP/SMX-sensitive) and 3+VRE, Fungal: C. Glabrata (sens to caspo, vori and dose dependent sens to fluconazole)  06/06/17: 2 sets of BCx (1 set each from periphery and Mediport) - taken after restarting ertapenem: IVAD grew VRE ttp 18.2 hours,  Periphery (Left arm) grew VRE ttp 17.3.  06/07/17 Pleural fluid (left) GS 1-10 PMN's, 1-10 Nucleated white cells, No organisms seen and 1604 nucleated cells. No growth on culture.   06/07/17 Abdominal abscess: labs cancelled - no specimens received.   06/08/17: Mediport BC + for VRE in 23 hrs (R-Amp, R- tetracycline per lab, suppressed result, R- doxycycline;  S-Linezolid, S-Dapto dose dependent),  Right hand peripheral cx: No growth  06/10/17: Blood culture 2/2: 1 periphery (RHand), 1 from Mediport before removed: no growth  06/11/17: Blood Culture 2/2 (both periphery) - no growth   06/19/17:   Perigastric fluid #1:  GS 10-25 PMNs -  GPB, GNB, GPC in pairs. 1+ C. Glabrata (R- fluconazole, voriconazole, S- Caspofungin); 2+ Enterococcus faecium (R- amp, PenG, Vanco; S- Linezolid)  06/19/17:  Liver fluid collection:  GS: 0 PMNs, Fungal and AFB stains negative, 1+ enterococcus faecium (R- Amp, PenG, Vanco, S- Dapto dose dependent, Linezolid)  06/19/17 : Perigastric fluid #2: GS: >25 PMNs, Gram negative bacilli; 3+ C. Glabrata.   07/04/17 U/A negative  07/04/17 Blood Cultures 3/3 (1 set PICC proximal port, 2 sets periphery): NGTD  07/04/17 MRSA amplification Nares: negative  07/05/17 Blood Culture 3/3 (2 PICC, 1 Periph) - NGTD  07/05/17 Liver Abscess (IR drain)- <1PMNs, GPC in prs and chains, 1 colony candida albicans (R- fluconazole, S-Caspo, vori)), 4+ Enterococcus faecium - two isolates with one now resistant to Daptomycin, the other dose dependent.   5/24: BCx 2 sets 1 peripheral 1 PICC. Peripheral grew E.coli after 13 hrs (Pansensitive).  5/24: UA no WBCS  5/26: BCx 2 sets 1 peripheral 1 PICC - no growth  5/27 Sputum Gram Stain >10 squamous epithealial cells <25 PMNs - not cultured/contaminated  5/29: BCx 2 sets (1 set periph, 1 set PICC): NGDT   5/29 Sputum - not collected   5/29 U/A negative   5/29 Stool negative for C.diff  5/31 throat culture negative for StrepA      Imaging/Other Relevant Diagnostics:     08/02/17 CXR  There is interval increase in pulmonary edema.  Interval increase in bilateral small pleural effusions with associated compressive atelectasis.    Assessment and Plan:  ID problem(s):   - 5/24 E.coli bacteremia, aspiration pneumonia.  - s/p VRE Bacteremia   - s/p enterobacter bacteremia  - s/p Multiple and extensive Intra-abdominal abscesses growing VRE, enterobacter, C. glabrata, C.albicans    72 y.o. female with h/o recent PE and pancreatic cancer, s/p whipple procedure (goal of cure), prolonged complicated including sepsis episodes (most recent 07/04/17), poor po intake/ malnutrition requiring TPN  (weaned to off) and PEG placement, multiple intraabdominal and hepatic abscesses and drains - abscesses VRE, Enterobacter, Candida positive. Last of many abdominal drains pulled on 07/22/17.  She had Enterobacter bacteremia on 2/12 and 3/2 and VRE bacteremia on 3/2, 4/4, 4/6 (believed this time to be from seeded mediport which was removed).  She was on linezolid for the VRE bacteremia since 5/2, after changed from Daptomycin to cover possible pulmonary infection. Later one of her strains of VRE was found to be resistant to Daptomycin, thus she's remained on Linezolid.      She developed increased confusion, tachycarida, and worsening anorexia as well as a new cough on 07/26/17. Blood ultures were obtained from her PICC and periphery. The peripheral stick grew E.coli (ttp 13 hrs). Chest CT showed a new small right pleural effusion, unchanged moderate left pleural effusion, progressive consolidative changes right lower lobe, and ground glass opacities in left upper lobe. CT of A/P demonstrated Intraabdominal collections overall stable to slightly decreased. New bacteremia source was not clear - could be intraabdominal or new aspiration pneumonia. For E.coli bacteremia meropenem started on 5/25, narrowed to ertapenem, further narrowed to cefazolin and po flagyl added to cover anaerobes in case of asp pna.  We were considering stopping her Linezolid on 5/31 given she had been on adequate and extended treatment for VRE  since 07/04/17.  However, on 5/29 she again spiked to 101.54F with increasing white count to 12.7 accompanied by tachycardia and increased confusion, and mild increase in O2 demands,  thus antibiotics again broadened to Pip/Tazo (and continued on Linezolid). CXR showed edema and persistent/increasing plueral fluid L>R. An abdominal US on 5/29 showed two fluid collections appearing stable as compared to A/P CT on 5/24.  Her 5/29 blood cultures have NGTD (should finalize by end of day).      Ivannia c/o sore  throat last week, I noted some white patches posterior OP but otherwise tissues were not swollen or erythematous. We recommended symptomatic treatment.  Her Strep throat cultures was negative.  She denies sore throat today.  She has made steady improvement on her currently antibiotic regimen though continues to have a wet cough. She has received furosemide IV for pulmonary edema, O2 down to 1.5L nc.  Her liver chemistries were elevated, now continue down trending.  She continues to struggle with trying to increase oral intake, continues with intermittent N/V.    She has been on Zosyn for 6 days. If her respiratory status remains stable or improves, consider discontinuing Zosyn on or around Day 7 changing to Amoxicillin to finish treatment for E.coli bacteremia. Depending on how she does once treatment for E.coli bacteremia is completed, will consider stopping Linezolid next.       Recommendations:    Continue Zosyn IV 4.5 grams every 6 hours through tomorrow as above.    Continue Linezolid 600 mg orally twice daily for VRE, duration TBD.    Follow weekly CBC with diff   Follow every 2-3 day CMP or more often per discretion of primary team.    ID team 3 will continue to follow along.    Lonzo Candy, ANP-BC  Infectious Diseases, Team 3  Pager [phone]  Office Ph [phone]

## 2017-08-06 ENCOUNTER — Inpatient Hospital Stay: Payer: Medicare (Managed Care)

## 2017-08-06 ENCOUNTER — Other Ambulatory Visit: Payer: Self-pay | Admitting: Cardiology

## 2017-08-06 LAB — CBC AND DIFFERENTIAL
Baso # K/uL: 0.1 10*3/uL (ref 0.0–0.1)
Basophil %: 1 %
Eos # K/uL: 0.5 10*3/uL — ABNORMAL HIGH (ref 0.0–0.4)
Eosinophil %: 5.7 %
Hematocrit: 25 % — ABNORMAL LOW (ref 34–45)
Hemoglobin: 7.8 g/dL — ABNORMAL LOW (ref 11.2–15.7)
IMM Granulocytes #: 0 10*3/uL
IMM Granulocytes: 0.4 %
Lymph # K/uL: 1.2 10*3/uL (ref 1.2–3.7)
Lymphocyte %: 12.9 %
MCH: 30 pg/cell (ref 26–32)
MCHC: 32 g/dL (ref 32–36)
MCV: 94 fL (ref 79–95)
Mono # K/uL: 0.9 10*3/uL (ref 0.2–0.9)
Monocyte %: 9.4 %
Neut # K/uL: 6.6 10*3/uL — ABNORMAL HIGH (ref 1.6–6.1)
Nucl RBC # K/uL: 0 10*3/uL (ref 0.0–0.0)
Nucl RBC %: 0 /100 WBC (ref 0.0–0.2)
Platelets: 265 10*3/uL (ref 160–370)
RBC: 2.6 MIL/uL — ABNORMAL LOW (ref 3.9–5.2)
RDW: 20.1 % — ABNORMAL HIGH (ref 11.7–14.4)
Seg Neut %: 70.6 %
WBC: 9.3 10*3/uL (ref 4.0–10.0)

## 2017-08-06 LAB — COMPREHENSIVE METABOLIC PANEL
ALT: 99 U/L — ABNORMAL HIGH (ref 0–35)
AST: 79 U/L — ABNORMAL HIGH (ref 0–35)
Albumin: 2.9 g/dL — ABNORMAL LOW (ref 3.5–5.2)
Alk Phos: 993 U/L — ABNORMAL HIGH (ref 35–105)
Anion Gap: 11 (ref 7–16)
Bilirubin,Total: 0.6 mg/dL (ref 0.0–1.2)
CO2: 30 mmol/L — ABNORMAL HIGH (ref 20–28)
Calcium: 8.8 mg/dL (ref 8.6–10.2)
Chloride: 99 mmol/L (ref 96–108)
Creatinine: 0.72 mg/dL (ref 0.51–0.95)
GFR,Black: 97 *
GFR,Caucasian: 84 *
Glucose: 101 mg/dL — ABNORMAL HIGH (ref 60–99)
Lab: 14 mg/dL (ref 6–20)
Potassium: 3.9 mmol/L (ref 3.3–5.1)
Sodium: 140 mmol/L (ref 133–145)
Total Protein: 6.7 g/dL (ref 6.3–7.7)

## 2017-08-06 LAB — BILIRUBIN, DIRECT: Bilirubin,Direct: 0.4 mg/dL — ABNORMAL HIGH (ref 0.0–0.3)

## 2017-08-06 LAB — BLOOD CULTURE
Bacterial Blood Culture: 0
Bacterial Blood Culture: 0

## 2017-08-06 LAB — LEGIONELLA CULTURE: Legionella Culture: 0

## 2017-08-06 MED ORDER — STERILE WATER FOR IRRIGATION IR SOLN *I*
900.0000 mL | Freq: Once | Status: AC
Start: 2017-08-06 — End: 2017-08-06
  Administered 2017-08-06: 600 mL via ORAL

## 2017-08-06 MED ORDER — ALTEPLASE 2 MG IJ SOLR *I*
0.5000 mg | Freq: Once | INTRAMUSCULAR | Status: AC
Start: 2017-08-06 — End: 2017-08-06
  Administered 2017-08-06: 2 mg
  Filled 2017-08-06: qty 2

## 2017-08-06 MED ORDER — PREGABALIN 75 MG PO CAPS *I*
75.0000 mg | ORAL_CAPSULE | Freq: Two times a day (BID) | ORAL | Status: DC
Start: 2017-08-06 — End: 2017-08-24
  Administered 2017-08-06 – 2017-08-24 (×35): 75 mg via ORAL
  Filled 2017-08-06 (×37): qty 1

## 2017-08-06 MED ORDER — IOHEXOL 350 MG/ML (OMNIPAQUE) IV SOLN *I*
1.0000 mL | Freq: Once | INTRAVENOUS | Status: AC
Start: 2017-08-06 — End: 2017-08-06
  Administered 2017-08-06: 126 mL via INTRAVENOUS

## 2017-08-06 MED ORDER — AMOXICILLIN-POT CLAVULANATE 875-125 MG PO TABS *I*
1.0000 | ORAL_TABLET | Freq: Two times a day (BID) | ORAL | Status: DC
Start: 2017-08-06 — End: 2017-08-09
  Administered 2017-08-06 – 2017-08-09 (×6): 1 via ORAL
  Filled 2017-08-06 (×8): qty 1

## 2017-08-06 MED ORDER — POTASSIUM CHLORIDE 20 MEQ/15ML (10%) PO SOLN *I*
20.0000 meq | Freq: Once | ORAL | Status: AC
Start: 2017-08-06 — End: 2017-08-06
  Administered 2017-08-06: 20 meq via ORAL
  Filled 2017-08-06: qty 15

## 2017-08-06 NOTE — Progress Notes (Signed)
SPIRITUAL ASSESSMENT NOTE    Identified Religion: Christian     Spiritual Concern: Other (comment) (homesick, worn from length of stay)  Spiritual Practices: Prayer     Patient Coping: Open discussion  Family Coping: Hopeful/ optimistic  Chaplain Interventions: Normalized feelings, Reflective listening, Prayer, Life review    Plan of Care: Patient expressed some weariness from extended stay, missing family (esp pets).  Prayed for strength.  Patient would welcome occasional visits and informed chaplain avail on call 24/7.    Shirlean Kelly      1:35 PM on 08/06/2017

## 2017-08-06 NOTE — Plan of Care (Signed)
Problem: Safety  Goal: Patient will remain free of falls  Outcome: Progressing towards goal      Problem: Pain/Comfort  Goal: Patient's pain or discomfort is manageable  Outcome: Progressing towards goal    Goal: Patient's pain or discomfort is manageable  Outcome: Progressing towards goal      Problem: Mobility  Goal: Functional status is maintained or improved - Geriatric  Outcome: Progressing towards goal    Goal: Patient's functional status is maintained or improved  Outcome: Progressing towards goal      Problem: Nutrition  Goal: Patient's nutritional status is maintained or improved  Outcome: Progressing towards goal    Goal: Nutritional status is maintained or improved - Geriatric  Outcome: Progressing towards goal      Problem: Cognitive function  Goal: Cognitive function will be maintained  Outcome: Progressing towards goal    Goal: Cognitive function will be maintained  Outcome: Progressing towards goal    Goal: Cognitive function will be maintained or return to baseline  Outcome: Progressing towards goal      Problem: Bowel Elimination  Goal: Elimination patterns are normal or improving  Outcome: Adequate for discharge Date Met: 08/06/17      Problem: Post-Operative Hemodynamic Stability  Goal: Maintain Hemodynamic Stability  Outcome: Progressing towards goal      Problem: Post-Operative Complications  Goal: Prevent post-operative complications  Outcome: Progressing towards goal      Problem: Post-Operative Bowel Elimination  Goal: Elimination pattern is normal or improving  Outcome: Progressing towards goal      Problem: Psychosocial  Goal: Demonstrates ability to cope with illness  Outcome: Progressing towards goal      Problem: Fluid and Electrolyte Imbalance  Goal: Fluid and Electrolyte imbalance  Outcome: Progressing towards goal

## 2017-08-06 NOTE — Progress Notes (Signed)
Surgical Oncology Surgery Progress Note    Patient: Amanda Galloway    LOS: 130 days    Attending: [place]. AFVSS.   Resting comfortably this AM. Pain controlled.  Denies overnight nausea, reporting minimal spit-up     OBJECTIVE  Physical Exam:  Temp:  [36 C (96.8 F)-37 C (98.6 F)] 37 C (98.6 F)  Heart Rate:  [91-100] 100  Resp:  [16-18] 16  BP: (108-124)/(64-70) 118/64   GEN: no apparent distress  HEENT: NCAT, face symmetric  CHEST: nonlabored respirations on 2LNC   ABD: soft, mildly distended, tender surrounding G-tube that is CDI, minimal erythema, without active leaking   NEURO/MOTOR: alert, appropriate  EXTREMITIES: atraumatic  VASCULAR: feet wwp, 1+ peripheral edema in BLE    Intake/Output:  06/03 0700 - 06/04 0659  In: 1341.7 [P.O.:600; I.V.:121.7]  Out: 700 [Urine:700]     Medications:  Current Facility-Administered Medications   Medication    lidocaine (LIDODERM) 5 % patch 1 patch    furosemide (LASIX) tablet 10 mg    senna (SENOKOT) tablet 2 tablet    sodium chloride 0.9 % IV    promethazine (PHENERGAN) 12.5 mg in sodium chloride 0.9% 25 mL IVPB    miconazole (MICATIN) 2 % powder    methadone (DOLOPHINE) tablet 10 mg    methadone (DOLOPHINE) tablet 7.5 mg    HYDROmorphone (DILAUDID) tablet 8 mg    ondansetron (ZOFRAN) injection 4 mg    alteplase (CATHFLO,ACTIVASE) injection 0.5-2 mg    HYDROmorphone (DILAUDID) injection 0.5 mg    Or    HYDROmorphone (DILAUDID) injection 1 mg    piperacillin-tazobactam (ZOSYN) IVPB 4.5 g    guaiFENesin (MUCINEX) 12 hr tablet 600 mg    sodium chloride tablet 2 g    metoclopramide (REGLAN) tablet 10 mg    levothyroxine (SYNTHROID, LEVOTHROID) tablet 150 mcg    enoxaparin (LOVENOX) injection 40 mg    linezolid (ZYVOX) tablet 600 mg    PROSOURCE NO CARB liquid LIQD 30 mL    albuterol (PROVENTIL) nebulization 2.5 mg    polyethylene glycol (GLYCOLAX,MIRALAX) powder 17 g    lactobacillus rhamnosus (GG)  (CULTURELLE) capsule 1 each    methyl salicylate-menthol (BENGAY) topical cream    bisacodyl (DULCOLAX) suppository 10 mg    DULoxetine (CYMBALTA) DR capsule 20 mg    naloxone (NARCAN) 0.4 mg/mL injection 0.1 mg    mineral oil-hydrophilic petrolatum (AQUAPHOR) ointment    sodium chloride 0.9 % flush 10 mL    sodium chloride 0.9 % flush 10 mL    sodium chloride 0.9 % FLUSH REQUIRED IF PATIENT HAS IV    dextrose 5 % FLUSH REQUIRED IF PATIENT HAS IV     Facility-Administered Medications Ordered in Other Encounters   Medication    etomidate (AMIDATE) 2 mg/mL injection    rocuronium (ZEMURON) 10 mg/mL injection         Laboratory values:   Recent Labs      08/06/17   0023  08/05/17   0045   WBC  9.3  8.0   Hemoglobin  7.8*  7.9*   Hematocrit  25*  25*   Platelets  265  280   INR   --   1.1   Sedimentation Rate   --   101*   CRP   --   48*     No components found with this basename: APTT, PT Recent Labs  08/06/17   0023  08/05/17   0045   Sodium  140  140   Potassium  3.9  3.7   Chloride  99  99   CO2  30*  29*   UN  14  13   Creatinine  0.72  0.68   Glucose  101*  115*   Calcium  8.8  8.5*   Magnesium   --   1.8   Phosphorus   --   4.0    Recent Labs      08/06/17   0023  08/05/17   0045   AST  79*  131*   ALT  99*  128*   Alk Phos  993*  1,110*   Bilirubin,Total  0.6  0.7   Bilirubin,Direct  0.4*  0.4*     Recent Labs      08/06/17   0023  08/05/17   0045   Total Protein  6.7  6.7   Albumin  2.9*  2.8*   Prealbumin   --   10*     Recent Labs      08/05/17   0045   Lipase  17      GLUCOSE:   No results for input(s): PGLU in the last 72 hours.  Imaging: No results found.     ASSESSMENT  Amanda Galloway is a 72 y.o. female with h/o recent PE and pancreatic cancer POD # 102  status post whipple procedure complicated by delayed gastric emptying.  NGT replaced on 04/04/17. UGI on 04/12/17 concerning for obstruction just beyond the [place] in the efferent limb.  Course complicated on 2/68/34 by rising LFTs and sepsis with  CT concerning for cholangitis given biliary tree obstruction and PV compression due to hematoma and afferent limb distention in setting of PE treatment. Is s/p IR PTC on 04/16/17 and IR percutaneous aspiration of anterior abdominal fluid collection on 04/24/17. IR LUQ perc drain placement 3/1. IR anterior abdominal 39f perc drain placement 3/3. IR anterior perc drain into left hepatic collection 3/6. Developed E. Coli bacteremia on 07/26/17 presumed secondary to aspiration pneumonia.    PLAN   CT Scan   ALP and transaminitis and TB downtrending, will continue to monitor    Continue Antibiotics per ID, IV zosyn and PO linezolid, await final culture data   Continue regular diet, holding TF 6/4-5 in hopes of increasing PO intake  Vent G-tube PRN nausea  C/w aggressive pulmonary toilet.   Bowel regimen   Analgesia with PO pain. Appreciate palliative care assistance   DVT ppx - Lovenox   OOB/ambulate, PT/OT   Dispo: SNF pending clinical improvment     Author: DKathrin Greathouse MD as of: 08/06/2017  at: 6:15 AM

## 2017-08-06 NOTE — Plan of Care (Signed)
Bowel Elimination     Elimination patterns are normal or improving Maintaining        Cognitive function     Cognitive function will be maintained Maintaining     Cognitive function will be maintained Maintaining     Cognitive function will be maintained or return to baseline Maintaining        GI Bleeding Elimination     Elimination of patterns are normal or improving Maintaining        Mobility     Functional status is maintained or improved - Geriatric Maintaining     Patient's functional status is maintained or improved Maintaining        Nutrition     Patient's nutritional status is maintained or improved Maintaining     Nutritional status is maintained or improved - Geriatric Maintaining        Pain/Comfort     Patient's pain or discomfort is manageable Maintaining     Patient's pain or discomfort is manageable Maintaining        Post-Operative Complications     Prevent post-operative complications Maintaining        Post-Operative Hemodynamic Stability     Maintain Hemodynamic Stability Maintaining        Safety     Patient will remain free of falls Maintaining          Fluid and Electrolyte Imbalance     Fluid and Electrolyte imbalance Progressing towards goal        Post-Operative Bladder Elimination     Patient is able to empty bladder or return to baseline Progressing towards goal        Post-Operative Bowel Elimination     Elimination pattern is normal or improving Progressing towards goal        Psychosocial     Demonstrates ability to cope with illness Progressing towards goal

## 2017-08-06 NOTE — Progress Notes (Signed)
Report Given   Gregary Signs, RN received handoff report from [place] on wcc5 unit for CT scan 8:33 AM.

## 2017-08-07 LAB — CBC AND DIFFERENTIAL
Baso # K/uL: 0.1 10*3/uL (ref 0.0–0.1)
Basophil %: 1.1 %
Eos # K/uL: 0.5 10*3/uL — ABNORMAL HIGH (ref 0.0–0.4)
Eosinophil %: 6.1 %
Hematocrit: 23 % — ABNORMAL LOW (ref 34–45)
Hemoglobin: 7.4 g/dL — ABNORMAL LOW (ref 11.2–15.7)
IMM Granulocytes #: 0 10*3/uL
IMM Granulocytes: 0.5 %
Lymph # K/uL: 1 10*3/uL — ABNORMAL LOW (ref 1.2–3.7)
Lymphocyte %: 12.4 %
MCH: 30 pg/cell (ref 26–32)
MCHC: 32 g/dL (ref 32–36)
MCV: 93 fL (ref 79–95)
Mono # K/uL: 0.9 10*3/uL (ref 0.2–0.9)
Monocyte %: 11 %
Neut # K/uL: 5.8 10*3/uL (ref 1.6–6.1)
Nucl RBC # K/uL: 0 10*3/uL (ref 0.0–0.0)
Nucl RBC %: 0 /100 WBC (ref 0.0–0.2)
Platelets: 286 10*3/uL (ref 160–370)
RBC: 2.5 MIL/uL — ABNORMAL LOW (ref 3.9–5.2)
RDW: 19.3 % — ABNORMAL HIGH (ref 11.7–14.4)
Seg Neut %: 68.9 %
WBC: 8.4 10*3/uL (ref 4.0–10.0)

## 2017-08-07 LAB — COMPREHENSIVE METABOLIC PANEL
ALT: 86 U/L — ABNORMAL HIGH (ref 0–35)
AST: 69 U/L — ABNORMAL HIGH (ref 0–35)
Albumin: 2.9 g/dL — ABNORMAL LOW (ref 3.5–5.2)
Alk Phos: 944 U/L — ABNORMAL HIGH (ref 35–105)
Anion Gap: 11 (ref 7–16)
Bilirubin,Total: 0.6 mg/dL (ref 0.0–1.2)
CO2: 32 mmol/L — ABNORMAL HIGH (ref 20–28)
Calcium: 9 mg/dL (ref 8.6–10.2)
Chloride: 93 mmol/L — ABNORMAL LOW (ref 96–108)
Creatinine: 0.77 mg/dL (ref 0.51–0.95)
GFR,Black: 90 *
GFR,Caucasian: 78 *
Glucose: 92 mg/dL (ref 60–99)
Lab: 13 mg/dL (ref 6–20)
Potassium: 3.9 mmol/L (ref 3.3–5.1)
Sodium: 136 mmol/L (ref 133–145)
Total Protein: 6.9 g/dL (ref 6.3–7.7)

## 2017-08-07 LAB — BILIRUBIN, DIRECT: Bilirubin,Direct: 0.4 mg/dL — ABNORMAL HIGH (ref 0.0–0.3)

## 2017-08-07 LAB — PHOSPHORUS: Phosphorus: 5.1 mg/dL — ABNORMAL HIGH (ref 2.7–4.5)

## 2017-08-07 LAB — MAGNESIUM: Magnesium: 1.9 mg/dL (ref 1.6–2.5)

## 2017-08-07 MED ORDER — METHADONE HCL 10 MG PO TABS *I*
10.0000 mg | ORAL_TABLET | Freq: Three times a day (TID) | ORAL | Status: AC
Start: 2017-08-07 — End: 2017-08-07
  Administered 2017-08-07: 10 mg via ORAL
  Filled 2017-08-07: qty 1

## 2017-08-07 MED ORDER — METHADONE HCL 5 MG PO TABS *I*
15.0000 mg | ORAL_TABLET | Freq: Every evening | ORAL | Status: DC
Start: 2017-08-07 — End: 2017-08-07

## 2017-08-07 MED ORDER — POTASSIUM CHLORIDE CRYS CR 10 MEQ PO TBCR *I*
10.0000 meq | ORAL_TABLET | Freq: Once | ORAL | Status: AC
Start: 2017-08-07 — End: 2017-08-07
  Administered 2017-08-07: 10 meq via ORAL
  Filled 2017-08-07: qty 1

## 2017-08-07 MED ORDER — METHADONE HCL 10 MG PO TABS *I*
10.0000 mg | ORAL_TABLET | Freq: Two times a day (BID) | ORAL | Status: DC
Start: 2017-08-08 — End: 2017-08-08

## 2017-08-07 MED ORDER — MAGNESIUM-OXIDE 400 (241.3 MG) MG PO TABS *WRAPPED*
400.0000 mg | ORAL_TABLET | Freq: Once | ORAL | Status: AC
Start: 2017-08-07 — End: 2017-08-07
  Administered 2017-08-07: 400 mg via ORAL
  Filled 2017-08-07: qty 1

## 2017-08-07 MED ORDER — HYDROMORPHONE HCL 2 MG/ML IJ SOLN *WRAPPED*
1.0000 mg | INTRAMUSCULAR | Status: DC | PRN
Start: 2017-08-07 — End: 2017-08-12
  Administered 2017-08-07 – 2017-08-09 (×3): 1 mg via INTRAVENOUS
  Filled 2017-08-07 (×2): qty 1

## 2017-08-07 MED ORDER — METHADONE HCL 10 MG PO TABS *I*
10.0000 mg | ORAL_TABLET | Freq: Three times a day (TID) | ORAL | Status: DC
Start: 2017-08-07 — End: 2017-08-07

## 2017-08-07 MED ORDER — PROSOURCE NO CARB PO LIQD *I*
30.0000 mL | Freq: Two times a day (BID) | ORAL | Status: DC
Start: 2017-08-07 — End: 2017-08-24
  Administered 2017-08-08 – 2017-08-24 (×27): 30 mL via ORAL

## 2017-08-07 MED ORDER — HYDROMORPHONE HCL 2 MG/ML IJ SOLN *WRAPPED*
0.5000 mg | INTRAMUSCULAR | Status: DC | PRN
Start: 2017-08-07 — End: 2017-08-12
  Administered 2017-08-10: 0.5 mg via INTRAVENOUS
  Filled 2017-08-07: qty 1

## 2017-08-07 MED ORDER — METHADONE HCL 5 MG PO TABS *I*
15.0000 mg | ORAL_TABLET | Freq: Every evening | ORAL | Status: DC
Start: 2017-08-08 — End: 2017-08-08

## 2017-08-07 MED ORDER — METHADONE HCL 10 MG PO TABS *I*
10.0000 mg | ORAL_TABLET | Freq: Two times a day (BID) | ORAL | Status: DC
Start: 2017-08-08 — End: 2017-08-07

## 2017-08-07 MED ORDER — METHADONE HCL 5 MG PO TABS *I*
7.5000 mg | ORAL_TABLET | Freq: Every day | ORAL | Status: DC
Start: 2017-08-08 — End: 2017-08-07

## 2017-08-07 NOTE — Plan of Care (Signed)
Cognitive function     Cognitive function will be maintained or return to baseline Goal not met          Fluid and Electrolyte Imbalance     Fluid and Electrolyte imbalance Maintaining        GI Bleeding Elimination     Elimination of patterns are normal or improving Maintaining        Mobility     Functional status is maintained or improved - Geriatric Maintaining     Patient's functional status is maintained or improved Maintaining        Nutrition     Patient's nutritional status is maintained or improved Maintaining     Nutritional status is maintained or improved - Geriatric Maintaining        Pain/Comfort     Patient's pain or discomfort is manageable Maintaining     Patient's pain or discomfort is manageable Maintaining        Post-Operative Bladder Elimination     Patient is able to empty bladder or return to baseline Maintaining        Post-Operative Bowel Elimination     Elimination pattern is normal or improving Maintaining        Post-Operative Complications     Prevent post-operative complications Maintaining        Post-Operative Hemodynamic Stability     Maintain Hemodynamic Stability Maintaining        Psychosocial     Demonstrates ability to cope with illness Maintaining        Safety     Patient will remain free of falls Maintaining

## 2017-08-07 NOTE — Progress Notes (Addendum)
Surgical Oncology Surgery Progress Note    Patient: Amanda Galloway    LOS: 131 days    Attending: [place]. AFVSS. Resting comfortably this am upon encounter. Reports that she has diffuse body aches that are quite bothersome. Denies f/c, n/v, CP or SOB. OOB to chair. Voiding spontaneously. +f, +bm. 920 of PO intake yesterday.      OBJECTIVE  Physical Exam:  Temp:  [35.8 C (96.5 F)-36.9 C (98.4 F)] 36.9 C (98.4 F)  Heart Rate:  [83-99] 99  Resp:  [14-16] 16  BP: (100-134)/(60-62) 112/60   GEN: no apparent distress  HEENT: NCAT, face symmetric  CHEST: nonlabored respirations on 2LNC   ABD: soft, mildly distended, tender surrounding G-tube that is CDI, minimal erythema, without active leaking   NEURO/MOTOR: alert, appropriate  EXTREMITIES: atraumatic  VASCULAR: feet wwp, 1+ peripheral edema in BLE    Intake/Output:  06/04 0700 - 06/05 0659  In: 1878.9 [P.O.:920; I.V.:88.9]  Out: 775 [Urine:775]     Medications:  Current Facility-Administered Medications   Medication    pregabalin (LYRICA) capsule 75 mg    amoxicillin-clavulanate (AUGMENTIN) 875-125 MG per tablet 1 tablet    lidocaine (LIDODERM) 5 % patch 1 patch    furosemide (LASIX) tablet 10 mg    senna (SENOKOT) tablet 2 tablet    sodium chloride 0.9 % IV    promethazine (PHENERGAN) 12.5 mg in sodium chloride 0.9% 25 mL IVPB    miconazole (MICATIN) 2 % powder    methadone (DOLOPHINE) tablet 10 mg    HYDROmorphone (DILAUDID) tablet 8 mg    ondansetron (ZOFRAN) injection 4 mg    HYDROmorphone (DILAUDID) injection 0.5 mg    Or    HYDROmorphone (DILAUDID) injection 1 mg    guaiFENesin (MUCINEX) 12 hr tablet 600 mg    sodium chloride tablet 2 g    metoclopramide (REGLAN) tablet 10 mg    levothyroxine (SYNTHROID, LEVOTHROID) tablet 150 mcg    enoxaparin (LOVENOX) injection 40 mg    linezolid (ZYVOX) tablet 600 mg    PROSOURCE NO CARB liquid LIQD 30 mL    albuterol (PROVENTIL) nebulization 2.5 mg     polyethylene glycol (GLYCOLAX,MIRALAX) powder 17 g    lactobacillus rhamnosus (GG) (CULTURELLE) capsule 1 each    methyl salicylate-menthol (BENGAY) topical cream    bisacodyl (DULCOLAX) suppository 10 mg    DULoxetine (CYMBALTA) DR capsule 20 mg    naloxone (NARCAN) 0.4 mg/mL injection 0.1 mg    mineral oil-hydrophilic petrolatum (AQUAPHOR) ointment    sodium chloride 0.9 % flush 10 mL    sodium chloride 0.9 % flush 10 mL    sodium chloride 0.9 % FLUSH REQUIRED IF PATIENT HAS IV    dextrose 5 % FLUSH REQUIRED IF PATIENT HAS IV     Facility-Administered Medications Ordered in Other Encounters   Medication    etomidate (AMIDATE) 2 mg/mL injection    rocuronium (ZEMURON) 10 mg/mL injection         Laboratory values:   Recent Labs      08/06/17   2355  08/06/17   0023  08/05/17   0045   WBC  8.4  9.3  8.0   Hemoglobin  7.4*  7.8*  7.9*   Hematocrit  23*  25*  25*   Platelets  286  265  280   INR   --    --   1.1   Sedimentation Rate   --    --  101*   CRP   --    --   48*     No components found with this basename: APTT, PT Recent Labs      08/06/17   2355  08/06/17   0023  08/05/17   0045   Sodium  136  140  140   Potassium  3.9  3.9  3.7   Chloride  93*  99  99   CO2  32*  30*  29*   UN  '13  14  13   ' Creatinine  0.77  0.72  0.68   Glucose  92  101*  115*   Calcium  9.0  8.8  8.5*   Magnesium  1.9   --   1.8   Phosphorus  5.1*   --   4.0    Recent Labs      08/06/17   2355  08/06/17   0023   AST  69*  79*   ALT  86*  99*   Alk Phos  944*  993*   Bilirubin,Total  0.6  0.6   Bilirubin,Direct  0.4*  0.4*     Recent Labs      08/06/17   2355  08/06/17   0023  08/05/17   0045   Total Protein  6.9  6.7  6.7   Albumin  2.9*  2.9*  2.8*   Prealbumin   --    --   10*     Recent Labs      08/05/17   0045   Lipase  17      GLUCOSE:   No results for input(s): PGLU in the last 72 hours.  Imaging: Ct Abdomen And Pelvis With Contrast    Result Date: 08/06/2017  1. Interval decrease in size of intrahepatic and perihepatic  fluid collections. No new collection. 2. Unchanged bilateral pleural effusions and diffuse body wall edema. Mesenteric misting/edema. 3. Status post Whipple procedure. Persistent pneumobilia. 4. Indwelling gastrostomy tube. END OF IMPRESSION I have personally reviewed the images and the Resident's/Fellow's interpretation and agree with or edited the findings. [facility] submits this DICOM format image data and final report to the [employer] [facility], an independent secure electronic health information exchange, on a reciprocally searchable basis (with patient authorization) for a minimum of 12 months after exam date.       ASSESSMENT  Amanda Galloway is a 72 y.o. female with h/o recent PE and pancreatic cancer POD # 102  status post whipple procedure complicated by delayed gastric emptying.  NGT replaced on 04/04/17. UGI on 04/12/17 concerning for obstruction just beyond the [place] in the efferent limb.  Course complicated on 2/69/48 by rising LFTs and sepsis with CT concerning for cholangitis given biliary tree obstruction and PV compression due to hematoma and afferent limb distention in setting of PE treatment. Is s/p IR PTC on 04/16/17 and IR percutaneous aspiration of anterior abdominal fluid collection on 04/24/17. IR LUQ perc drain placement 3/1. IR anterior abdominal 57f perc drain placement 3/3. IR anterior perc drain into left hepatic collection 3/6. Developed E. Coli bacteremia on 07/26/17 presumed secondary to aspiration pneumonia.    PLAN   ALP and transaminitis and TB downtrending, will continue to monitor    ABX: PO Augmentin and PO linezolid, await final culture data, appreciate ID recs  Continue regular diet, holding TF 6/4-5 in hopes of increasing PO intake  Vent G-tube PRN nausea  C/w aggressive pulmonary toilet.   Bowel  regimen   Analgesia with PO pain. Appreciate palliative care assistance   DVT ppx - Lovenox   OOB/ambulate, PT/OT   Dispo: SNF pending clinical improvment     Author: Melburn Hake, MD  as of: 08/07/2017  at: 6:04 AM             [facility]-GI Attending Addendum:   I personally examined the patient, reviewed the notes, and discussed the plan of care with the residents and patient. Agree with detailed resident's note. Please see the note above for details of history, exam, labs, assessment/plan which reflect my input.     72 year old female with pancreatic cancer status post pancreaticoduodenectomy complicated by fluid collections and malnutrition, and gastrostomy tube placement with perforation.     Pulmonary effusions. Stable.   Pulmonary insufficiency with need for oxygen. Continue supportive care. Will hold on tapping effusion.   Malnutrition. Encouraging oral intake. Calorie counts. Gastrostomy tube change today without difficulty.   Sepsis with EColi Bacteremia due to biliary source/cholangitis. Sensitive to Augmentin. Will need long-term suppressive antibiotics. Continue Linezolid for now.   Hyperglycemia with intermittent need for sliding scale.   Elevated liver enzymes related to recent cholangitis. Improving.   Anemia of chronic disease. Iron supplementation and oral dietary encouragement.     Delano Metz, MD, FACS  Hepatobiliary, Pancreas & GI Surgery

## 2017-08-07 NOTE — Consults (Signed)
Wound Consult Note  [id number]    New consult received. Requesting treatment recommendation for skin breakdown surrounding PEG.  Consult requested by NP.    Patient states that pain associated with PEG is "inside", not her skin. Daughter voices some frustration with mom's ongoing pain.    Peri-tubular skin: minor amount of denuded skin (< 1 cm2) inferior to tube. Bubble of hypergranular tissue 1-2 o'clock may be causing increase drainage and minor skin inflammation.    Assessment  Hypergranular tissue associated with gastrostomy.  Minor irritant dermatitis    Plan  Routine tube care.  I applied Cavilon to inflamed skin and placed a non-adhesive foam (cut like drain sponge) under PEG bumper. Some evidence has shown that non-adhesive foam can help manage hypergranular tissue.   I also recommend ordering silver nitrate (Pharmacy) for [place] Nurse to apply to hypergranular tissue. Can be done tomorrow if desired. Please page [name] if/when  ordered.    [place] RN MS CNS CWON [phone]

## 2017-08-07 NOTE — Progress Notes (Signed)
Palliative Care Progress Note    HPI: [name] w/ pancreatic CA s/p Whipple 1/25 (treatment for cure) w/ prolonged complicated hospitalization issues w/ infection, nutrition/appetite/nausea/constipation, s/p PEG w/ perf now resolved, most recent CT abd 5/16 showed no new fluid collections and decrease in existing hepatic collections. Pt resumed methadone on 5/8 and weaning off her dilaudid PCA 5/23. All drains now removed, receiving TFs at night to optimize appetite during day. Referrals for SNF rehab pending. Pt w/ signs of sepsis 5/24 & +BC w/ RLL consolidation on CT concerning for asp pna, abd CT w/o acute changes. Pt had improved w/ broadening of abx then developed fever 5/28 requiring addit WU and abx switches.    Subjective: pt sleeping soundly -opened eyes briefly to voice, visited w/ dtr [name] in room    Objective: chart reviewed, noted PEG leaking & family advocating to remove PEG, CT abd/pelvis yesterday w/o significant changes, decreasing size of hepatic fluid collections & no new collections appreciated; pt now transitioned off IV abx to oral/pgt administration; SNF referrals pending    Pt had received IV dilaudid prn recently and was quite sleepy when visited late afternoon; dtr [name] in room and relaying pt's pain has not been well controlled, pt requiring freq dilaudid po and IV prn for pain at [place] site - we discussed CT findings were encouraging and all tension sutures now removed from Gtube site - some minor skin excoriation should not be causing pt's severe pain which is holding her back from ambulating or wanting to take much PO    Discussed going up on methadone again as last increase was on her fibromyalgia. Pt has not always been consistent in c/o pain at Gtube site so it's confusing to know what is culprit. Validated concerns and real experience of pain, do not see role for nerve block, per dtr Dr Ron Agee is considering changing out PEG to another w/ more flexible less rigid type and  wants pt to keep PEG for her optimal nutrition which is foundation of overall healing and recovery as holding TFs did not significantly increase pt's PO intake or appetite      08/06/17 CT abd/pelvis w/ contrast  1. Interval decrease in size of intrahepatic and perihepatic fluid collections. No new collection.      2. Unchanged bilateral pleural effusions and diffuse body wall edema. Mesenteric misting/edema.      3. Status post Whipple procedure. Persistent pneumobilia.      4. Indwelling gastrostomy tube.     Palliative Care ROS:  Pain   Mild  Nausea   Mild  Anorexia   Severe  Anxiety   Moderate  Shortness of Breath   Mild  Tiredness/Fatigue   Moderate  Drowsiness/Sleepiness   Mild  Airway Secretions   no  Constipation   no  Unable to Respond   no   Last stool 6/5     Physical Examination:   BP: (108-134)/(60-68)   Temp:  [35.8 C (96.5 F)-36.9 C (98.4 F)]   Temp src: Temporal (06/05 1134)  Heart Rate:  [88-99]   Resp:  [14-16]   SpO2:  [93 %-98 %]   Gen appearance: older Caucasian female, sleeping soundly, laying in bed, opened eyes to voice but did not answer questions (recently received IV dilaudid prn)  Lungs: easy resp,1 L NC   Heart: reg  Abd: large, soft, +BS, PEG intact/clamped  Extrem: edematous LEs  Skin:  Dry, less reddened LEs  Neuro: at baseline is able to  ambulate w/ walker & standby assist & is A+O x 3; pt sleeping during visit today & dtr states she hasn't been up walking at all today    Assessment/Plan:  73 yo F w/ pancreatic CA s/p Whipple (treatment for cure) w/ prolonged hospitalization. Pt w/ recent set backs r/t infection, now off IV abx, still using occasional IV dilaudid prn in addition to PO dilaudid prn. Plan to increase methadone for better overall long term pain control. Per report, may consider changing out PEG tube to more flexible tubing to see if improves comfort.    Pain/dyspnea  Dilaudid 8 mg po Q 2hrs prn (x 6 on 6/4, x 3 on 6/5)   Dilaudid 1 mg IV Q 2 hrs prn (x 1 on 6/4, x 2  on 6/5)  Lyrica 75 mg po BID for fibromyalgia (total 150 mg/d, increased 5/28)  Methadone 7.07/12/08 mg po (total 27.5 mg/d) restarted 5/7, last increase 5/14 -increase to 10 mg po TID (total 30 mg/d) last ECG on 5/29 w/ QTc 426  Albuterol neb Q 6 hrs prn (none used)  Lidocaine patch daily x 2 (abd, LUE)  Acetaminophen 650 mg po Q 6 hrs prn fever   [place] cream 4x/d prn to joints (x 1 on 6/4, x 2 on 6/5)  Guaifenesin 600 mg po BID expectorant    Anxiety/agitation/nausea/fatigue  Cymbalta 20 mg po/d  Metoclopramide 10 mg po TID AC  Ondansetron 4 mg IV Q 6 hrs prn (last x 1 on 6/3)  Promethazine 12.5 mg IV Q 6 hrs prn (last x 2 on 6/2)  Ativan 0.25 mg IV Q 6 hrs prn (x 1 on 5/16)  Encouraged complementary adjuvant therapies for coping, meditation app, aromatherapy, gentle massage      Prevention of opioid induced constipation, last stool 6/5  Bisacodyl supp pr daily   Miralax 17 gm po daily  Senna 17.6 mg po BID    Skin/oral care  Aquaphor prn   Eucerin prn for BLEs  Miconazole powder BID under breasts    GOC/prognosis  Full code  Dtr Rod Mae is HCP (very supportive and informed, former [facility] RN)  Goal currently is to prioritize pt's symptom control, long course though pt endorses agreement w/ continuing current treatment plan, dispo to SNF in hopes of eventually making recovery back to independent living    Pt case discussed w/ RN and covering provider. We will continue to follow along. Please call if questions/concerns.    Total Time Spent 40 minutes:             >50% of time was spent in counseling and/or coordination of care.     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    Anemia D64.9    Hypothyroidism E03.9    Pancreatic cancer C25.9     Anemia D64.9     hx of Pulmonary embolism I26.99    Acute pulmonary insufficiency J98.4    Depression F32.9    Sepsis A41.9       No Known Allergies (drug, envir, food or latex)    Scheduled Meds:    pregabalin  75 mg Oral 2 times per day    amoxicillin-clavulanate  1 tablet Oral 2 times per day  lidocaine  1 patch Transdermal Q24H    furosemide  10 mg Oral BID    senna  2 tablet Oral 2 times per day    miconazole   Topical 2 times per day    methadone  10 mg Oral BID    guaiFENesin  600 mg Oral 2 times per day    sodium chloride  2 g Per G Tube TID PC    metoclopramide  10 mg Oral TID AC    levothyroxine  150 mcg Oral Daily @ 0600    enoxaparin  40 mg Subcutaneous Daily @ 2100    linezolid  600 mg Oral 2 times per day    PROSOURCE NO CARB  30 mL Oral Daily with breakfast    polyethylene glycol  17 g Oral Daily    lactobacillus rhamnosus (GG)  1 capsule Oral Daily    bisacodyl  10 mg Rectal Daily    DULoxetine  20 mg Oral Q24H       Continuous Infusions:    sodium chloride         PRN Meds:  HYDROmorphone PF **OR** HYDROmorphone PF, promethazine, HYDROmorphone, ondansetron, albuterol, methyl salicylate-menthol, naloxone, mineral oil-hydrophilic petrolatum, sodium chloride, sodium chloride, sodium chloride, dextrose

## 2017-08-07 NOTE — Progress Notes (Signed)
Infectious Diseases Follow Up Note    Personally reviewed chart, labs, medications. Patient seen.     CC:  F/U s/p VRE bacteremia, liver and abdominal abscesses (VRE, enterobacter, C. glabrata and C. albicans, s/p Enterobacter cloacae bacteremia, and most recently E.coli bacteremia, Asp PNA.     Subjective:  Denies feverishness/chills, N/V/D, no SOB, no complaint of cough today.  She continues to have pain at PEG insertion site, states she "want this tube out...if tube was out, I'd be able to eat".  Time spent discussing need for protein for healing and maintaining health how she felt she could accomplish this.      ROS: as above.     Current Meds:  Scheduled Meds:   pregabalin  75 mg Oral 2 times per day    amoxicillin-clavulanate  1 tablet Oral 2 times per day    lidocaine  1 patch Transdermal Q24H    furosemide  10 mg Oral BID    senna  2 tablet Oral 2 times per day    miconazole   Topical 2 times per day    methadone  10 mg Oral BID    guaiFENesin  600 mg Oral 2 times per day    sodium chloride  2 g Per G Tube TID PC    metoclopramide  10 mg Oral TID AC    levothyroxine  150 mcg Oral Daily @ 0600    enoxaparin  40 mg Subcutaneous Daily @ 2100    linezolid  600 mg Oral 2 times per day    PROSOURCE NO CARB  30 mL Oral Daily with breakfast    polyethylene glycol  17 g Oral Daily    lactobacillus rhamnosus (GG)  1 capsule Oral Daily    bisacodyl  10 mg Rectal Daily    DULoxetine  20 mg Oral Q24H     Continuous Infusions:   sodium chloride       PRN Meds:.   calcium carbonate  1,000 mg Oral BID PRN    HYDROmorphone PF  0.5 mg Intravenous Q1H PRN    Or    HYDROmorphone PF  1 mg Intravenous Q1H PRN    LORazepam  0.5 mg Oral Q6H PRN    heparin lock flush  50 Units Intracatheter PRN    ondansetron  4 mg Intravenous Q6H PRN     Objective:  BP: (102-134)/(60-68)   Temp:  [35.8 C (96.5 F)-36.9 C (98.4 F)]   Temp src: Temporal (06/05 0758)  Heart Rate:  [83-99]   Resp:  [14-16]   SpO2:  [93 %-98  %]     Physical Exam:              General Appearance: in bed, appears in NAD but worried appearance.  Focusing on PEG tube discomfort.    HEENT: Sclera anicteric. MMM   Pulm: crackles left base but overall somewhat improved air movement. WOB unlabored (O2 1L nc).   CV: RRR, no M/R/G, bilateral lower extremity 2+ pitting edema unchanged  Extremities: WWP, ruddy colored skin LEs  Abdomen: semi soft, ND, NT all quad except immediately around PEG.      Skin: warm/dry, no rashes    Neuro: oriented to person/place, appropriate in answers   Lines: PICC LUE (4/11) site benign.       Recent Labs  Lab 08/06/17  2355 08/06/17  0023 08/05/17  0045   WBC 8.4 9.3 8.0   Hemoglobin 7.4* 7.8* 7.9*   Hematocrit 23* 25*  25*   Platelets 286 265 280   Seg Neut % 68.9 70.6 71.5   Lymphocyte % 12.4 12.9 12.1   Monocyte % 11.0 9.4 8.6   Eosinophil % 6.1 5.7 6.1       Recent Labs  Lab 08/06/17  2355 08/06/17  0023 08/05/17  0045   Sodium 136 140 140   Potassium 3.9 3.9 3.7   CO2 32* 30* 29*   UN '13 14 13   ' Creatinine 0.77 0.72 0.68   Glucose 92 101* 115*   Calcium 9.0 8.8 8.5*         Lab results: 08/06/17  2355 08/06/17  0023 08/05/17  0045 08/04/17  0402 08/03/17  0208   Total Protein 6.9 6.7 6.7 6.3 6.2*   Albumin 2.9* 2.9* 2.8* 2.7* 2.7*   ALT 86* 99* 128* 147* 163*   AST 69* 79* 131* 175* 194*   Alk Phos 944* 993* 1,110* 1,120* 1,147*   Bilirubin,Total 0.6 0.6 0.7 0.7 0.7       Lab Results  Component Value Date/Time   CRP 48 (H) 08/05/2017 0045   CRP 63 (H) 07/29/2017 0019   CRP 54 (H) 07/19/2017 0107       Lab Results  Component Value Date/Time   Sedimentation Rate 101 (H) 08/05/2017 0045   Sedimentation Rate 39 (H) 07/29/2017 0019   Sedimentation Rate 40 (H) 07/19/2017 0107       Micro:   1/25: Intraoperative bile: GS 0 pmns, no organisms, Cx: no growth  2/12: 1 set of blood cultures from the right Mediport (no peripheral bld was sent): positive for Enterobacter cloacae complex in 70 hrs, sensitive to zosyn.   2/18 abdominal GS had  >25 PMNs, many GPC in pairs/chains, many GNB and cultures 4+ Enterobacter cloacae complex. The cultures also grew 1+ Candida glabrata   2/21: BCx from the Right peripheral IV, Mediport, L IJ:  No growth  05/03/17: IR-guided I&D with drain placement of the left upper quadrant collection: GS > 25 PMNs, many GNB and GN diplococci, Cultures with 4+ Enterobacter cloacae complex (resistant to Zosyn and CTX), C. Albicans and C. Glabrata, and Candida tropicalis  05/04/17:    1 set of blood cultures from left IJ: VRE at 15.3 TTP. Daptomycin sensitivities: MIC 51mg/ml (dose dependent),  Linezolid   1 set from periphery: VRE and Enterobacter cloacae at 14.8 TTP   05/05/17:  IR-guided I&D with drain placement of the intrahepatic collection: GS 1-10 PMNs, very few GPC in pairs. Fungal cultures with Candida glabrata  05/05/17: Blood cultures from periphery, Mediport and IJ: no growth  05/04/17: Urine cultures obtained (for unclear reason): no growth  05/08/17: Abscess GS: >25 PMNs, many GPC in chains, few GNB. Aerobic cultures growing 2+ Enterobacter (ertapenem, Cipro, TMP/SMX-sensitive) and 3+VRE, Fungal: C. Glabrata (sens to caspo, vori and dose dependent sens to fluconazole)  06/06/17: 2 sets of BCx (1 set each from periphery and Mediport) - taken after restarting ertapenem: IVAD grew VRE ttp 18.2 hours,  Periphery (Left arm) grew VRE ttp 17.3.  06/07/17 Pleural fluid (left) GS 1-10 PMN's, 1-10 Nucleated white cells, No organisms seen and 1604 nucleated cells. No growth on culture.   06/07/17 Abdominal abscess: labs cancelled - no specimens received.   06/08/17: Mediport BC + for VRE in 23 hrs (R-Amp, R- tetracycline per lab, suppressed result, R- doxycycline;  S-Linezolid, S-Dapto dose dependent),  Right hand peripheral cx: No growth   06/10/17: Blood culture 2/2: 1 periphery (RHand), 1  from Mediport before removed: no growth  06/11/17: Blood Culture 2/2 (both periphery) - no growth   06/19/17:  Perigastric fluid #1:  GS 10-25 PMNs -  GPB, GNB, GPC  in pairs. 1+ C. Glabrata (R- fluconazole, voriconazole, S- Caspofungin); 2+ Enterococcus faecium (R- amp, PenG, Vanco; S- Linezolid)  06/19/17:  Liver fluid collection:  GS: 0 PMNs, Fungal and AFB stains negative, 1+ enterococcus faecium (R- Amp, PenG, Vanco, S- Dapto dose dependent, Linezolid)  06/19/17 : Perigastric fluid #2: GS: >25 PMNs, Gram negative bacilli; 3+ C. Glabrata.   07/04/17 U/A negative  07/04/17 Blood Cultures 3/3 (1 set PICC proximal port, 2 sets periphery): NGTD  07/04/17 MRSA amplification Nares: negative  07/05/17 Blood Culture 3/3 (2 PICC, 1 Periph) - NGTD  07/05/17 Liver Abscess (IR drain)- <1PMNs, GPC in prs and chains, 1 colony candida albicans (R- fluconazole, S-Caspo, vori)), 4+ Enterococcus faecium - two isolates with one now resistant to Daptomycin, the other dose dependent.   5/24: BCx 2 sets 1 peripheral 1 PICC. Peripheral grew E.coli after 13 hrs (Pansensitive).  5/24: UA no WBCS  5/26: BCx 2 sets 1 peripheral 1 PICC - no growth  5/27 Sputum Gram Stain >10 squamous epithealial cells <25 PMNs - not cultured/contaminated  5/29: BCx 2 sets (1 set periph, 1 set PICC): no growth  5/29 Sputum - not collected   5/29 U/A negative   5/29 Stool negative for C.diff  5/31 throat culture negative for StrepA      Imaging/Other Relevant Diagnostics:   6/4 CT abd/pelvis with contrast (indication: persistent abd pain) IMPRESSION:   1. Interval decrease in size of intrahepatic and perihepatic fluid collections. No new collection.   2. Unchanged bilateral pleural effusions and diffuse body wall edema. Mesenteric misting/edema.   3. Status post Whipple procedure. Persistent pneumobilia.   4. Indwelling gastrostomy tube.      Assessment and Plan:  ID problem(s):   - E.coli bacteremia/aspiration pneumonia (5/24).  - s/p VRE Bacteremia (3/2, 4/4, 4/6)   - s/p enterobacter bacteremia (2/12, 3/2)  - s/p Multiple Intra-abdominal abscesses growing VRE, enterobacter, C. glabrata, C.albicans    72 y.o. female with h/o  recent PE and pancreatic cancer, s/p whipple procedure (goal of cure), prolonged complicated including sepsis episodes (most recent 07/04/17), poor po intake/ malnutrition requiring TPN then later a PEG, multiple intraabdominal and hepatic abscesses and drains - abscesses grew VRE, Enterobacter, Candida. Last of many abdominal drains pulled on 07/22/17.  She had Enterobacter bacteremia on 2/12 and 3/2 and VRE bacteremia on 3/2, 4/4, 4/6 (believed in April to be from a seeded mediport which was removed).  She has been on linezolid for the VRE bacteremia since 5/2, after changed from Daptomycin. It was later found one of her 2 VRE isolates was resistant to Daptomycin (why she remained on Linezolid).  Most recently she developed E.coli bactermia possibly related to aspiration PNA, though source was not entirely clear. Meropenem was started on 5/25, narrowed to ertapenem, then further narrowed to cefazolin and po flagyl on 5/28 with thoughts to later d/c Linezolid. However she developed fever and mild leukocyctosis after 2 days on Cefazolin/Flagy thus was broadened again to Pip/Tazo and Linezolid kept on board.  She had symptoms consistent with pneumonia (cough, slight increase in 02 requirement).  She improved on the Pip/Tazo x 7 days, and is now switched to Augmentin to finish a 14 day course for E.coli bacteremia (completes with last dose on 08/09/17). Her 5/29 blood cultures were sterile.  Amanda Galloway continues to appear improved, no residual cough today, WBC normal, afebrile and O2 down to 1L nc, WOB unlabored.  She continues to c/o abdominal pain at PEG site though abdominal exam today was overall benign.  She is not taking much orally regarding food, even with TFs on hold.  She is determined to have PEG removed.  I discussed with her to consider this carefully given if it needed to be replaced, that may not be an option given risk and she needs adequate nutrition for continued healing and ability to fight infections.        Discussed case/plan with Dr. William Hamburger, [place] attending.    Recommendations:    Continue Augmentin twice daily through 6/7 to complete E.coli bacteremia treatment.    Continue Linezolid 600 mg orally twice daily for VRE, duration TBD (will consider removing this as well depending on clinical course over the next week or so)   Follow weekly CBC with diff   Follow twice weekly CMP or more often per discretion of primary team.    ID team 3 will continue to follow along.    Lonzo Candy, ANP-BC  Infectious Diseases, Team 3  Pager [phone]  Office Ph [phone]    ID attending: Dr. Lynita Lombard

## 2017-08-07 NOTE — Progress Notes (Signed)
08/07/17 1100   [facility] Patient Class Review   Patient Class Review Inpatient   Patient Class Effective 03/29/2017  Earnest Conroy RN   Utilization  Management  X [phone]  Page [phone]

## 2017-08-08 LAB — CBC AND DIFFERENTIAL
Baso # K/uL: 0.1 10*3/uL (ref 0.0–0.1)
Basophil %: 1.1 %
Eos # K/uL: 0.4 10*3/uL (ref 0.0–0.4)
Eosinophil %: 5.7 %
Hematocrit: 24 % — ABNORMAL LOW (ref 34–45)
Hemoglobin: 7.5 g/dL — ABNORMAL LOW (ref 11.2–15.7)
IMM Granulocytes #: 0 10*3/uL
IMM Granulocytes: 0.3 %
Lymph # K/uL: 1.4 10*3/uL (ref 1.2–3.7)
Lymphocyte %: 21 %
MCH: 29 pg/cell (ref 26–32)
MCHC: 31 g/dL — ABNORMAL LOW (ref 32–36)
MCV: 94 fL (ref 79–95)
Mono # K/uL: 0.7 10*3/uL (ref 0.2–0.9)
Monocyte %: 11.3 %
Neut # K/uL: 4 10*3/uL (ref 1.6–6.1)
Nucl RBC # K/uL: 0 10*3/uL (ref 0.0–0.0)
Nucl RBC %: 0 /100 WBC (ref 0.0–0.2)
Platelets: 295 10*3/uL (ref 160–370)
RBC: 2.6 MIL/uL — ABNORMAL LOW (ref 3.9–5.2)
RDW: 19.4 % — ABNORMAL HIGH (ref 11.7–14.4)
Seg Neut %: 60.6 %
WBC: 6.5 10*3/uL (ref 4.0–10.0)

## 2017-08-08 LAB — COMPREHENSIVE METABOLIC PANEL
ALT: 72 U/L — ABNORMAL HIGH (ref 0–35)
AST: 70 U/L — ABNORMAL HIGH (ref 0–35)
Albumin: 2.7 g/dL — ABNORMAL LOW (ref 3.5–5.2)
Alk Phos: 990 U/L — ABNORMAL HIGH (ref 35–105)
Anion Gap: 9 (ref 7–16)
Bilirubin,Total: 0.5 mg/dL (ref 0.0–1.2)
CO2: 32 mmol/L — ABNORMAL HIGH (ref 20–28)
Calcium: 9.1 mg/dL (ref 8.6–10.2)
Chloride: 96 mmol/L (ref 96–108)
Creatinine: 0.71 mg/dL (ref 0.51–0.95)
GFR,Black: 99 *
GFR,Caucasian: 86 *
Glucose: 93 mg/dL (ref 60–99)
Lab: 10 mg/dL (ref 6–20)
Potassium: 4.4 mmol/L (ref 3.3–5.1)
Sodium: 137 mmol/L (ref 133–145)
Total Protein: 6.7 g/dL (ref 6.3–7.7)

## 2017-08-08 LAB — BASIC METABOLIC PANEL
Anion Gap: 11 (ref 7–16)
CO2: 30 mmol/L — ABNORMAL HIGH (ref 20–28)
Calcium: 9 mg/dL (ref 8.6–10.2)
Chloride: 96 mmol/L (ref 96–108)
Creatinine: 0.75 mg/dL (ref 0.51–0.95)
GFR,Black: 93 *
GFR,Caucasian: 80 *
Glucose: 117 mg/dL — ABNORMAL HIGH (ref 60–99)
Lab: 12 mg/dL (ref 6–20)
Potassium: 4.5 mmol/L (ref 3.3–5.1)
Sodium: 137 mmol/L (ref 133–145)

## 2017-08-08 LAB — CBC
Hematocrit: 26 % — ABNORMAL LOW (ref 34–45)
Hemoglobin: 8 g/dL — ABNORMAL LOW (ref 11.2–15.7)
MCH: 30 pg/cell (ref 26–32)
MCHC: 31 g/dL — ABNORMAL LOW (ref 32–36)
MCV: 98 fL — ABNORMAL HIGH (ref 79–95)
Platelets: 311 10*3/uL (ref 160–370)
RBC: 2.6 MIL/uL — ABNORMAL LOW (ref 3.9–5.2)
RDW: 19.9 % — ABNORMAL HIGH (ref 11.7–14.4)
WBC: 7.6 10*3/uL (ref 4.0–10.0)

## 2017-08-08 LAB — PROTIME-INR
INR: 1.1 (ref 0.9–1.1)
Protime: 13 s — ABNORMAL HIGH (ref 10.0–12.9)

## 2017-08-08 LAB — BILIRUBIN, DIRECT: Bilirubin,Direct: 0.3 mg/dL (ref 0.0–0.3)

## 2017-08-08 MED ORDER — HALOPERIDOL LACTATE 5 MG/ML IJ SOLN *I*
0.5000 mg | Freq: Once | INTRAMUSCULAR | Status: AC
Start: 2017-08-08 — End: 2017-08-08
  Administered 2017-08-08: 0.5 mg via INTRAVENOUS

## 2017-08-08 MED ORDER — PANTOPRAZOLE SODIUM 40 MG PO TBEC *I*
40.0000 mg | DELAYED_RELEASE_TABLET | Freq: Every morning | ORAL | Status: DC
Start: 2017-08-09 — End: 2017-08-09
  Administered 2017-08-09: 40 mg via ORAL
  Filled 2017-08-08 (×2): qty 1

## 2017-08-08 MED ORDER — METHADONE HCL 10 MG PO TABS *I*
10.0000 mg | ORAL_TABLET | Freq: Three times a day (TID) | ORAL | Status: AC
Start: 2017-08-08 — End: 2017-08-16
  Administered 2017-08-08 – 2017-08-16 (×21): 10 mg via ORAL
  Filled 2017-08-08 (×23): qty 1

## 2017-08-08 MED ORDER — HALOPERIDOL LACTATE 5 MG/ML IJ SOLN *I*
INTRAMUSCULAR | Status: AC
Start: 2017-08-08 — End: 2017-08-09
  Filled 2017-08-08: qty 1

## 2017-08-08 NOTE — Progress Notes (Addendum)
Surgical Oncology Surgery Progress Note    Patient: Amanda Galloway    LOS: 132 days    Attending: [place]. AFVSS.   Resting comfortably this am upon encounter. Already walked this AM.  Reports that she has diffuse body aches that are quite bothersome.   Denies f/c, n/v, CP or SOB. OOB to chair. Voiding spontaneously. +f, +bm.      OBJECTIVE  Physical Exam:  Temp:  [36 C (96.8 F)-36.6 C (97.9 F)] 36.6 C (97.9 F)  Heart Rate:  [86-100] 86  Resp:  [16] 16  BP: (100-120)/(60-76) 106/76   GEN: no apparent distress  HEENT: NCAT, face symmetric  CHEST: nonlabored respirations on 2LNC   ABD: soft, mildly distended, tender surrounding G-tube that is CDI, minimal erythema, without active leaking   NEURO/MOTOR: alert, appropriate  EXTREMITIES: atraumatic  VASCULAR: feet wwp, 1+ peripheral edema in BLE    Intake/Output:  06/05 0700 - 06/06 0659  In: 390 [P.O.:360]  Out: 1350 [Urine:1350]     Medications:  Current Facility-Administered Medications   Medication    methadone (DOLOPHINE) tablet 10 mg    HYDROmorphone (DILAUDID) injection 0.5 mg    Or    HYDROmorphone (DILAUDID) injection 1 mg    PROSOURCE NO CARB liquid LIQD 30 mL    pregabalin (LYRICA) capsule 75 mg    amoxicillin-clavulanate (AUGMENTIN) 875-125 MG per tablet 1 tablet    lidocaine (LIDODERM) 5 % patch 1 patch    furosemide (LASIX) tablet 10 mg    senna (SENOKOT) tablet 2 tablet    sodium chloride 0.9 % IV    promethazine (PHENERGAN) 12.5 mg in sodium chloride 0.9% 25 mL IVPB    miconazole (MICATIN) 2 % powder    HYDROmorphone (DILAUDID) tablet 8 mg    ondansetron (ZOFRAN) injection 4 mg    guaiFENesin (MUCINEX) 12 hr tablet 600 mg    sodium chloride tablet 2 g    metoclopramide (REGLAN) tablet 10 mg    levothyroxine (SYNTHROID, LEVOTHROID) tablet 150 mcg    enoxaparin (LOVENOX) injection 40 mg    linezolid (ZYVOX) tablet 600 mg    PROSOURCE NO CARB liquid LIQD 30 mL    albuterol (PROVENTIL)  nebulization 2.5 mg    polyethylene glycol (GLYCOLAX,MIRALAX) powder 17 g    lactobacillus rhamnosus (GG) (CULTURELLE) capsule 1 each    methyl salicylate-menthol (BENGAY) topical cream    bisacodyl (DULCOLAX) suppository 10 mg    DULoxetine (CYMBALTA) DR capsule 20 mg    naloxone (NARCAN) 0.4 mg/mL injection 0.1 mg    mineral oil-hydrophilic petrolatum (AQUAPHOR) ointment    sodium chloride 0.9 % flush 10 mL    sodium chloride 0.9 % flush 10 mL    sodium chloride 0.9 % FLUSH REQUIRED IF PATIENT HAS IV    dextrose 5 % FLUSH REQUIRED IF PATIENT HAS IV     Facility-Administered Medications Ordered in Other Encounters   Medication    etomidate (AMIDATE) 2 mg/mL injection    rocuronium (ZEMURON) 10 mg/mL injection         Laboratory values:   Recent Labs      08/08/17   0213  08/06/17   2355   WBC  6.5  8.4   Hemoglobin  7.5*  7.4*   Hematocrit  24*  23*   Platelets  295  286     No components found with this basename: APTT, PT Recent Labs  08/08/17   0213  08/06/17   2355   Sodium  137  136   Potassium  4.4  3.9   Chloride  96  93*   CO2  32*  32*   UN  10  13   Creatinine  0.71  0.77   Glucose  93  92   Calcium  9.1  9.0   Magnesium   --   1.9   Phosphorus   --   5.1*    Recent Labs      08/08/17   0213  08/06/17   2355   AST  70*  69*   ALT  72*  86*   Alk Phos  990*  944*   Bilirubin,Total  0.5  0.6   Bilirubin,Direct  0.3  0.4*     Recent Labs      08/08/17   0213  08/06/17   2355   Total Protein  6.7  6.9   Albumin  2.7*  2.9*     No results for input(s): AMY, LIP in the last 72 hours.   GLUCOSE:   No results for input(s): PGLU in the last 72 hours.  Imaging: Ct Abdomen And Pelvis With Contrast    Result Date: 08/06/2017  1. Interval decrease in size of intrahepatic and perihepatic fluid collections. No new collection. 2. Unchanged bilateral pleural effusions and diffuse body wall edema. Mesenteric misting/edema. 3. Status post Whipple procedure. Persistent pneumobilia. 4. Indwelling gastrostomy  tube. END OF IMPRESSION I have personally reviewed the images and the Resident's/Fellow's interpretation and agree with or edited the findings. [facility] submits this DICOM format image data and final report to the [facility], an independent secure electronic health information exchange, on a reciprocally searchable basis (with patient authorization) for a minimum of 12 months after exam date.       ASSESSMENT  Amanda Galloway is a 72 y.o. female with h/o recent PE and pancreatic cancer POD # 102  status post whipple procedure complicated by delayed gastric emptying.  NGT replaced on 04/04/17. UGI on 04/12/17 concerning for obstruction just beyond the [place] in the efferent limb.  Course complicated on 4/74/25 by rising LFTs and sepsis with CT concerning for cholangitis given biliary tree obstruction and PV compression due to hematoma and afferent limb distention in setting of PE treatment. Is s/p IR PTC on 04/16/17 and IR percutaneous aspiration of anterior abdominal fluid collection on 04/24/17. IR LUQ perc drain placement 3/1. IR anterior abdominal 53f perc drain placement 3/3. IR anterior perc drain into left hepatic collection 3/6. Developed E. Coli bacteremia on 07/26/17 presumed secondary to aspiration pneumonia.    PLAN   ALP and transaminitis and TB downtrending, will continue to monitor    ABX: PO Augmentin and PO linezolid, await final culture data, appreciate ID recs  Continue regular diet, holding TF in hopes of increasing PO intake  Vent G-tube PRN nausea  C/w aggressive pulmonary toilet.   Bowel regimen   Analgesia with PO pain. Appreciate palliative care assistance   DVT ppx - Lovenox   OOB/ambulate, PT/OT   Dispo: SNF pending clinical improvment     Author: DKathrin Greathouse MD as of: 08/08/2017  at: 9:57 AM             [facility] Attending Addendum:   I personally examined the patient, reviewed the notes, and discussed the plan of care with the residents and patient. Agree with detailed  resident's note. Please see  the note above for details of history, exam, labs, assessment/plan which reflect my input.     72 year old female with pancreatic cancer status post pancreaticoduodenectomy complicated by fluid collections and malnutrition, and gastrostomy tube placement with perforation.     Delirium. Resolved.     Pulmonary effusions. Stable.   Pulmonary insufficiency with need for oxygen. Continue supportive care. Will hold on tapping effusion.   Malnutrition. Encouraging oral intake. Calorie counts. Will change gastrostomy tube.   Sepsis with EColi Bacteremia due to biliary source/cholangitis. Sensitive to Augmentin. Will need long-term suppressive antibiotics.   Hyperglycemia with intermittent need for sliding scale.   Elevated liver enzymes related to recent cholangitis.   Anemia of chronic disease. Iron supplementation and oral dietary encouragement.   Abdominal wall pain.     Delano Metz, MD, FACS  Hepatobiliary, Pancreas & GI Surgery

## 2017-08-08 NOTE — Plan of Care (Signed)
Impaired Ambulation     STG - IMPROVE AMBULATION Progressing towards therapy goal; extend goal x 5 more visits        Impaired Bed Mobility     STG - IMPROVE BED MOBILITY Progressing towards therapy goal; extend goal x 5 more visits     STG - IMPROVE BED MOBILITY Progressing towards therapy goal; extend goal x 5 more visits        Impaired Stair Navigation     STG - IMPROVE STAIR NAVIGATION Progressing towards therapy goal; extend goal x 5 more visits        Impaired Transfers     STG - IMPROVE TRANSFERS Progressing towards therapy goal; extend goal x 5 more visits     STG - IMPROVE TRANSFERS Progressing towards therapy goal; extend goal x 5 more visits

## 2017-08-08 NOTE — Plan of Care (Signed)
Problem: Safety  Goal: Patient will remain free of falls  Outcome: Progressing towards goal      Problem: Pain/Comfort  Goal: Patient's pain or discomfort is manageable  Outcome: Progressing towards goal    Goal: Patient's pain or discomfort is manageable  Outcome: Progressing towards goal      Problem: Mobility  Goal: Functional status is maintained or improved - Geriatric  Outcome: Progressing towards goal      Problem: Nutrition  Goal: Patient's nutritional status is maintained or improved  Outcome: Progressing towards goal    Goal: Nutritional status is maintained or improved - Geriatric  Outcome: Progressing towards goal      Problem: Cognitive function  Goal: Cognitive function will be maintained  Outcome: Progressing towards goal    Goal: Cognitive function will be maintained  Outcome: Progressing towards goal    Goal: Cognitive function will be maintained or return to baseline  Outcome: Progressing towards goal      Problem: Bowel Elimination  Goal: Elimination patterns are normal or improving  Outcome: Progressing towards goal      Problem: Post-Operative Hemodynamic Stability  Goal: Maintain Hemodynamic Stability  Outcome: Maintaining      Problem: Post-Operative Complications  Goal: Prevent post-operative complications  Outcome: Progressing towards goal      Problem: Fluid and Electrolyte Imbalance  Goal: Fluid and Electrolyte imbalance  Outcome: Progressing towards goal      Problem: GI Bleeding Elimination  Goal: Elimination of patterns are normal or improving  Outcome: Progressing towards goal      Problem: Post-Operative Bladder Elimination  Goal: Patient is able to empty bladder or return to baseline  Outcome: Maintaining

## 2017-08-08 NOTE — Progress Notes (Signed)
Physical Therapy Note:       08/08/17 1400   Visit Number   Visit Number [facility]) / Treatment Day [facility]) (update of care plan)   Additional Comments   Additional comments Update on plan of care. Goals remain appropriate.     Rodena Piety, PT, DPT  Pager [phone]

## 2017-08-08 NOTE — Progress Notes (Signed)
Palliative Care Progress Note    HPI: [name] w/ pancreatic CA s/p Whipple 1/25 (treatment for cure) w/ prolonged complicated hospitalization issues w/ infection, nutrition/appetite/nausea/constipation, s/p PEG w/ perf now resolved, most recent CT abd 5/16 showed no new fluid collections and decrease in existing hepatic collections. Pt resumed methadone on 5/8 and weaning off her dilaudid PCA 5/23. All drains now removed, receiving TFs at night to optimize appetite during day. Referrals for SNF rehab pending. Pt w/ signs of sepsis 5/24 & +BC w/ RLL consolidation on CT concerning for asp pna, abd CT w/o acute changes. Pt had improved w/ broadening of abx then developed fever 5/28 requiring addit WU and abx switches. Pt now off IV abx but having issues still w/ pain around PEG, using occas IV dilaudid prn, methadone increased on 6/6 and anticipate changing out PEG tubing to more flexible option hopefully more comfortable.    Subjective: pt sleeping soundly -had received IV pain meds 2 hrs prior and PO dilaudid in last 45 min- did not open eyes to voice, visited w/ dtr [name] & her friend in room    Objective: chart reviewed,  pt now transitioned off IV abx to oral/pgt administration; SNF referrals pending; unfortunately pt still having issues w/ pain around PEG, methadone was increased today & per dtr [name] pt had nice morning, ambulated and initially reporting pain managed when [name] first arrived at ~ 2 pm then later having acute uncontrolled pain LUQ around PEG; [name] questioning if pt may have ulcer and if she should get scope - deferred to primary team this decision, pt had not been on steroids and placed on protonix today, she's been on multiple abx & no h/o pain better/worse w/ food or TFs, seems pain worsens w/ activity which supports argument for positional type pain r/t PEG & decreasing balloon size seemed to improve pain temporarily; support provided for dtr who is understandably very frustrated  w/ pt's long course and struggling to see her Mom in pain while also not wanting the pain meds to cause sleepiness and backslide her gains in functional recovery    Palliative Care ROS:  Shortness of Breath   Mild  Tiredness/Fatigue   Severe  Drowsiness/Sleepiness   Severe  Constipation   no  Unable to Respond   no pt sleeping soundly during visit s/p dilaudid   Last stool 6/6     Physical Examination:   BP: (100-120)/(60-76)   Temp:  [35.9 C (96.6 F)-36.6 C (97.9 F)]   Temp src: Temporal (06/06 1553)  Heart Rate:  [86-100]   Resp:  [16]   SpO2:  [96 %-100 %]   Gen appearance: older Caucasian female, sleeping soundly, laying back in recliner (recently received IV & PO dilaudid prn)  Lungs: easy resp,1 L NC   Heart: reg  Abd: large, soft, PEG intact/clamped  Extrem: edematous LEs  Skin:  Dry, mildly reddened LEs  Neuro: at baseline is able to ambulate w/ walker & standby assist & is A+O x 3; pt sleeping during visit today & dtr states she was up walking this AM but not this afternoon d/t increased pain    Assessment/Plan:  [name] w/ pancreatic CA s/p Whipple (treatment for cure) w/ prolonged hospitalization. Pt w/ recent set backs r/t infection, now off IV abx, still using occasional IV dilaudid prn in addition to PO dilaudid prn. Increased methadone today for better overall long term pain control. Per report, anticipate changing out PEG tube to more  flexible tubing to see if improves comfort.    Pain/dyspnea  Dilaudid 8 mg po Q 2hrs prn (x 6 on 6/5, x 6 on 6/6)   Dilaudid 1 mg IV Q 2 hrs prn (x 2 on 6/5, x 1 on 6/6)  Lyrica 75 mg po BID for fibromyalgia (total 150 mg/d, increased 5/28)  Methadone 10 mg po TID (total 30 mg/d, increased last 6/6) last ECG on 5/29 w/ QTc 426  Albuterol neb Q 6 hrs prn (none used)  Lidocaine patch daily x 2 (abd, LUE)  Acetaminophen 650 mg po Q 6 hrs prn fever   Bengay cream 4x/d prn to joints (x 2 on 6/5, x 2 on 6/6)  Guaifenesin 600 mg po BID  expectorant    Anxiety/agitation/nausea/fatigue  Cymbalta 20 mg po/d  Metoclopramide 10 mg po TID AC  Pantoprazole 20 mg po daily  Ondansetron 4 mg IV Q 6 hrs prn (last x 1 on 6/3)  Promethazine 12.5 mg IV Q 6 hrs prn (last x 2 on 6/2)  Ativan 0.25 mg IV Q 6 hrs prn (last x 1 on 5/16)  Encouraged complementary adjuvant therapies for coping, meditation app, aromatherapy, gentle massage      Prevention of opioid induced constipation, last stool 6/6  Bisacodyl supp pr daily   Miralax 17 gm po daily  Senna 17.6 mg po BID    Skin/oral care  Aquaphor prn   Eucerin prn for BLEs  Miconazole powder BID under breasts    GOC/prognosis  Full code  Dtr Rod Mae is HCP (very supportive and informed, former [facility] RN)  Goal currently is to prioritize pt's symptom control, long course though pt endorses agreement w/ continuing current treatment plan, dispo to SNF in hopes of eventually making recovery back to independent living    We will continue to follow along. Please call if questions/concerns.    Total Time Spent 35 minutes:             >50% of time was spent in counseling and/or coordination of care.     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    Anemia D64.9    Hypothyroidism E03.9    Pancreatic cancer C25.9    Anemia D64.9     hx of Pulmonary embolism I26.99    Acute pulmonary insufficiency J98.4    Depression F32.9    Sepsis A41.9       No Known Allergies (drug, envir, food or latex)    Scheduled Meds:    methadone  10 mg Oral TID    [START ON 08/09/2017] pantoprazole  40 mg Oral QAM    PROSOURCE NO CARB  30 mL Oral BID WC    pregabalin  75 mg Oral 2 times per day    amoxicillin-clavulanate  1 tablet Oral 2 times per day    lidocaine   1 patch Transdermal Q24H    furosemide  10 mg Oral BID    senna  2 tablet Oral 2 times per day    miconazole   Topical 2 times per day    guaiFENesin  600 mg Oral 2 times per day    sodium chloride  2 g Per G Tube TID PC    metoclopramide  10 mg Oral TID AC    levothyroxine  150 mcg Oral Daily @ 0600    enoxaparin  40 mg Subcutaneous Daily @ 2100    linezolid  600 mg Oral 2 times per day    PROSOURCE NO CARB  30 mL Oral Daily with breakfast    polyethylene glycol  17 g Oral Daily    lactobacillus rhamnosus (GG)  1 capsule Oral Daily    bisacodyl  10 mg Rectal Daily    DULoxetine  20 mg Oral Q24H       Continuous Infusions:    sodium chloride         PRN Meds:  HYDROmorphone PF **OR** HYDROmorphone PF, promethazine, HYDROmorphone, ondansetron, albuterol, methyl salicylate-menthol, naloxone, mineral oil-hydrophilic petrolatum, sodium chloride, sodium chloride

## 2017-08-08 NOTE — Progress Notes (Signed)
Physical Therapy Treatment Note    Amanda Galloway is currently functioning below her baseline and will likely require SNF rehab before returning to her prior living arrangement. She will likely return to her prior level of functioning with a short term rehab stay. However, if discharge directly to home is desired, [name] will need 24 hour assistance with all mobility and require Home PT     PT Discharge Equipment Recommended: To be determined if returning to their prior living environment today.     08/08/17 [place]   Visit Number   Visit Number [facility]) / Treatment Day [facility]) 6   Visit Details [facility])   Visit Type [facility]) Follow Up   Reason for visit [facility]) General   Precautions/Observations   Precautions used Yes   LDA Observation IV lines;Feeding tube;O2 (comment)  (disconnected; L via NC)   Fall Precautions General falls precautions   Other Pt received sleeping in the recliner.  Agreeable to PT upon awakening.  Writer managed O2 lines t/o.    Pain Assessment   *Is the patient currently in pain? Denies   Additional comments Pt had no c/o pain t/o.    Cognition   Arousal/Alertness Appropriate responses to stimuli   Following Commands Follows simple commands with increased time;Follows simple commands with repetition   Additional Comments Pt able to dual task while ambulating with reading a variety of signs on each side of the hall.  Initially pt stops ambulating to read and progresses to continued ambulation w/ reading.    Bed Mobility   Bed mobility Not tested   Additional comments In the recliner at the start/end of session.    Transfers   Transfers Tested   Sit to Stand Minimum ;1 person assist;Verbal cues   Stand to sit Minimum ;1 person assist;Verbal cues   Transfer Assistive Device rolling walker   Additional comments Up from the recliner by multiple reps during session w/ emphasis on slow eccentric control.  Pt requires cues for hand placement t/o.    Mobility   Mobility  Tested   Gait Pattern Decreased cadence;Decreased R step length;Decreased R step height;Decreased L step length;Decreased L step height;Trunk flexed   Ambulation Assist Contact guard;1 person assist   Ambulation Distance (Feet) ~454ft   Ambulation Assistive Device rolling walker   Additional comments Pt was able to ambulate w/ the RW and make 2- 360 degree turns w/ increased time and minA on turns.  Pt dual tasking with guidance from writer to scan the environment and read signs t/o. Noted path deviation with lateral gaze.     Therapeutic Exercises   Additional comments Pt completes repeated sit<>stands and mini squats s/p ambulation. Tolerated well.    Balance   Balance Tested   Sitting - Static Independent ;Unsupported   Standing - Static Standby assist;Supported   Standing - Dynamic Contact guard;Supported   Additional Comments RW to assist with improved stability.   PT AM-PAC Mobility   Turning over in bed? 1   Sitting down on and standing up from a chair with arms? 1   Moving from lying on back to sitting on the side of the bed? 1   Moving to and from a bed to a chair? 3   Need to walk in hospital room? 3   Climbing 3 - 5 steps with a railing? 3  (Prior clinical judgement; not performed)   Total Raw Score 12   Standardized Score  35.33   CMS 1-100% Score 69   Assessment   Brief Assessment Remains appropriate for skilled therapy   Problem List Impaired UE strength;Impaired UE ROM;Impaired LE strength;Impaired LE ROM;Pain contributing to impairment;Impaired functional mobility;Impaired balance;Impaired endurance   Patient / Family Goal rehab   Plan/Recommendation   Treatment Interventions [facility]   PT Frequency 2-4x/wk   Mobility Recommendations 1A with RW, please assist with ambulation out of room 3x/day   Referral Recommendations OT   Discharge Recommendations [place]   PT Discharge Equipment Recommended To be determined   Assessment/Recommendations Reviewed With: Patient;Nursing    Next PT Visit progress all mobility; higher level balance with ambulation, dual task   Time Calculation   PT Timed Codes 28  (FT x 14 min, Gait x 14 min. )   PT Untimed Codes 0   PT Unbilled Time 4   PT Total Treatment 28   Plan and Onset date   Plan of Care Date 07/23/17  (To be updated 6.6.19)   Onset Date 03/29/17   Treatment Start Date 07/08/17   Bridget Hartshorn, [place]    I was present during the entire Physical Therapy treatment.  I have reviewed and agree with the Student Physical Therapist Assistant notes as documented in the chart.   Angelena Sole, PTA, [place]  Pager [phone]

## 2017-08-09 ENCOUNTER — Inpatient Hospital Stay: Payer: Medicare (Managed Care)

## 2017-08-09 ENCOUNTER — Other Ambulatory Visit: Payer: Self-pay | Admitting: Cardiology

## 2017-08-09 DIAGNOSIS — Z95828 Presence of other vascular implants and grafts: Secondary | ICD-10-CM

## 2017-08-09 DIAGNOSIS — K9423 Gastrostomy malfunction: Secondary | ICD-10-CM

## 2017-08-09 LAB — COMPREHENSIVE METABOLIC PANEL
ALT: 65 U/L — ABNORMAL HIGH (ref 0–35)
ALT: 64 U/L — ABNORMAL HIGH (ref 0–35)
AST: 64 U/L — ABNORMAL HIGH (ref 0–35)
AST: 59 U/L — ABNORMAL HIGH (ref 0–35)
Albumin: 3.3 g/dL — ABNORMAL LOW (ref 3.5–5.2)
Albumin: 3.2 g/dL — ABNORMAL LOW (ref 3.5–5.2)
Alk Phos: 1056 U/L — ABNORMAL HIGH (ref 35–105)
Alk Phos: 1023 U/L — ABNORMAL HIGH (ref 35–105)
Anion Gap: 12 (ref 7–16)
Anion Gap: 11 (ref 7–16)
Bilirubin,Total: 0.4 mg/dL (ref 0.0–1.2)
Bilirubin,Total: 0.5 mg/dL (ref 0.0–1.2)
CO2: 29 mmol/L — ABNORMAL HIGH (ref 20–28)
CO2: 29 mmol/L — ABNORMAL HIGH (ref 20–28)
Calcium: 9.2 mg/dL (ref 8.6–10.2)
Calcium: 9.4 mg/dL (ref 8.6–10.2)
Chloride: 97 mmol/L (ref 96–108)
Chloride: 98 mmol/L (ref 96–108)
Creatinine: 0.75 mg/dL (ref 0.51–0.95)
Creatinine: 0.74 mg/dL (ref 0.51–0.95)
GFR,Black: 93 *
GFR,Black: 94 *
GFR,Caucasian: 80 *
GFR,Caucasian: 82 *
Glucose: 118 mg/dL — ABNORMAL HIGH (ref 60–99)
Glucose: 95 mg/dL (ref 60–99)
Lab: 13 mg/dL (ref 6–20)
Lab: 13 mg/dL (ref 6–20)
Potassium: 4.3 mmol/L (ref 3.3–5.1)
Potassium: 4.4 mmol/L (ref 3.3–5.1)
Sodium: 138 mmol/L (ref 133–145)
Sodium: 138 mmol/L (ref 133–145)
Total Protein: 7.3 g/dL (ref 6.3–7.7)
Total Protein: 7.5 g/dL (ref 6.3–7.7)

## 2017-08-09 LAB — MAGNESIUM: Magnesium: 1.9 mg/dL (ref 1.6–2.5)

## 2017-08-09 LAB — CBC AND DIFFERENTIAL
Baso # K/uL: 0.1 10*3/uL (ref 0.0–0.1)
Baso # K/uL: 0.1 10*3/uL (ref 0.0–0.1)
Basophil %: 1 %
Basophil %: 1 %
Eos # K/uL: 0.4 10*3/uL (ref 0.0–0.4)
Eos # K/uL: 0.4 10*3/uL (ref 0.0–0.4)
Eosinophil %: 4.5 %
Eosinophil %: 5 %
Hematocrit: 25 % — ABNORMAL LOW (ref 34–45)
Hematocrit: 27 % — ABNORMAL LOW (ref 34–45)
Hemoglobin: 7.8 g/dL — ABNORMAL LOW (ref 11.2–15.7)
Hemoglobin: 8.2 g/dL — ABNORMAL LOW (ref 11.2–15.7)
IMM Granulocytes #: 0.1 10*3/uL
IMM Granulocytes #: 0.1 10*3/uL
IMM Granulocytes: 0.6 %
IMM Granulocytes: 0.8 %
Lymph # K/uL: 1.3 10*3/uL (ref 1.2–3.7)
Lymph # K/uL: 1.4 10*3/uL (ref 1.2–3.7)
Lymphocyte %: 16.9 %
Lymphocyte %: 17.5 %
MCH: 30 pg/cell (ref 26–32)
MCH: 30 pg/cell (ref 26–32)
MCHC: 31 g/dL — ABNORMAL LOW (ref 32–36)
MCHC: 31 g/dL — ABNORMAL LOW (ref 32–36)
MCV: 98 fL — ABNORMAL HIGH (ref 79–95)
MCV: 98 fL — ABNORMAL HIGH (ref 79–95)
Mono # K/uL: 0.8 10*3/uL (ref 0.2–0.9)
Mono # K/uL: 1 10*3/uL — ABNORMAL HIGH (ref 0.2–0.9)
Monocyte %: 10.7 %
Monocyte %: 12.5 %
Neut # K/uL: 5.1 10*3/uL (ref 1.6–6.1)
Neut # K/uL: 5.1 10*3/uL (ref 1.6–6.1)
Nucl RBC # K/uL: 0 10*3/uL (ref 0.0–0.0)
Nucl RBC # K/uL: 0 10*3/uL (ref 0.0–0.0)
Nucl RBC %: 0 /100 WBC (ref 0.0–0.2)
Nucl RBC %: 0 /100 WBC (ref 0.0–0.2)
Platelets: 317 10*3/uL (ref 160–370)
Platelets: 335 10*3/uL (ref 160–370)
RBC: 2.6 MIL/uL — ABNORMAL LOW (ref 3.9–5.2)
RBC: 2.7 MIL/uL — ABNORMAL LOW (ref 3.9–5.2)
RDW: 19.3 % — ABNORMAL HIGH (ref 11.7–14.4)
RDW: 19.9 % — ABNORMAL HIGH (ref 11.7–14.4)
Seg Neut %: 63.4 %
Seg Neut %: 66.1 %
WBC: 7.7 10*3/uL (ref 4.0–10.0)
WBC: 8 10*3/uL (ref 4.0–10.0)

## 2017-08-09 LAB — PHOSPHORUS: Phosphorus: 4.7 mg/dL — ABNORMAL HIGH (ref 2.7–4.5)

## 2017-08-09 LAB — PROTIME-INR
INR: 1.2 — ABNORMAL HIGH (ref 0.9–1.1)
Protime: 13.4 s — ABNORMAL HIGH (ref 10.0–12.9)

## 2017-08-09 LAB — BILIRUBIN, DIRECT: Bilirubin,Direct: 0.3 mg/dL (ref 0.0–0.3)

## 2017-08-09 LAB — LACTATE, PLASMA: Lactate: 1.7 mmol/L (ref 0.5–2.2)

## 2017-08-09 MED ORDER — PANTOPRAZOLE SODIUM 40 MG PO TBEC *I*
40.0000 mg | DELAYED_RELEASE_TABLET | Freq: Two times a day (BID) | ORAL | Status: DC
Start: 2017-08-09 — End: 2017-08-24
  Administered 2017-08-09 – 2017-08-24 (×29): 40 mg via ORAL
  Filled 2017-08-09 (×32): qty 1

## 2017-08-09 MED ORDER — HYDROMORPHONE HCL 2 MG PO TABS *I*
1.0000 mg | ORAL_TABLET | Freq: Once | ORAL | Status: DC
Start: 2017-08-10 — End: 2017-08-10
  Filled 2017-08-09: qty 1

## 2017-08-09 MED ORDER — NALOXONE HCL 0.4 MG/ML IJ SOLN *WRAPPED*
0.1000 mg | Status: AC | PRN
Start: 2017-08-09 — End: 2017-08-16
  Administered 2017-08-09 – 2017-08-10 (×2): 0.1 mg via INTRAVENOUS
  Filled 2017-08-09 (×3): qty 1

## 2017-08-09 MED ORDER — PIPERACILLIN/TAZOBACTAM 4.5 G IN NS MINI-BAG PLUS 110 ML *I*
4.5000 g | Freq: Four times a day (QID) | INTRAVENOUS | Status: DC
Start: 2017-08-09 — End: 2017-08-14
  Administered 2017-08-09 – 2017-08-14 (×18): 4.5 g via INTRAVENOUS
  Filled 2017-08-09 (×21): qty 110

## 2017-08-09 MED ORDER — SODIUM CHLORIDE 0.9 % IV BOLUS *I*
500.0000 mL | Freq: Once | Status: AC
Start: 2017-08-09 — End: 2017-08-09
  Administered 2017-08-09: 500 mL via INTRAVENOUS

## 2017-08-09 MED ORDER — HYDROMORPHONE HCL 2 MG PO TABS *I*
2.0000 mg | ORAL_TABLET | Freq: Once | ORAL | Status: DC
Start: 2017-08-10 — End: 2017-08-09

## 2017-08-09 NOTE — Progress Notes (Signed)
Interventional Radiology Pre-Procedure Handoff/Checklist    NPO: [x] YES [] NO [] N/A     Tube feed Stopped: [x] YES [] NO [] N/A      Anticoagulants:lovenox 6/6    Telemetry: [] YES [x] NO [] N/A     Transport mode: Stretcher  Number of Transporters needed: 1      IV access:   PICC Double Lumen (Power) 06/13/17 Clamped Left Upper Extremity (Active)   Phlebitis Scale Grade 0 08/09/2017  8:35 AM   Infiltration Scale Grade 0 08/09/2017  8:35 AM   Proximal Lumen Status Blood return noted;Flushed;Normal saline locked 08/09/2017  8:35 AM   Distal Lumen Status Blood return noted;Flushed;Normal saline locked 08/09/2017  8:35 AM   External Length Check (cm) 1 cm 08/08/2017  2:13 PM   AC Circumference (cm) 28 cm 08/08/2017  2:13 PM   Upper Arm Circumference (cm) 26 cm 08/08/2017  2:13 PM   Dressing Type BioPatch;Transparent 08/09/2017  8:35 AM   Dressing Status Clean, dry and intact 08/09/2017  8:35 AM   (T)Transparent Drsg Change-Q7D Dressing changed 08/08/2017  2:13 PM   Dressing Change Due 08/15/17 08/09/2017  8:35 AM   Bag to hub changed? No 08/09/2017  8:35 AM   Next bag to hub change due 08/12/17 08/09/2017  8:35 AM   Cap(s) Changed? No 08/09/2017  8:35 AM   Next cap change due 08/12/17 08/09/2017  8:35 AM   **Need for continuing central line addressed? Yes - central line still necessary 08/09/2017  8:35 AM       Respiratory: O2 Nasal Cannula 2 liters    Consentable:yes    Alert and Oriented to person, place and time?: yes    Code Status:Full     Does patient wear an insulin pump? [] YES [x] NO [] N/A     Precautions:none    Allergies: No Known Allergies (drug, envir, food or latex)    Rubin Payor, RN Received Handoff Report from [place] for IR procedure 10:16 AM

## 2017-08-09 NOTE — Significant Event (Signed)
Adult Sepsis Team Initial Evaluation Form    Time: 1583  Reason for Sepsis Activation?: Meets activation criteria OR other provider concern?    11 F  has a past medical history of Acute kidney failure (03/31/2017); Cancer; Depression; Fibromyalgia; and Hypothyroidism. with prolonged hospital course (day 133) with recent e. Coli bacteremia (07/26/17) with other recurrences of sepsis was getting routine G tube exchange. Sepsis alert was called for hypoxia (85% on 6L) when flat in addition to decreased mental status.     Upon my evaluation:  Pt lying inbed inad  Pulm: nc, non-labored breathing cta b  Cv: rrr no mrg  Abd: soft at peg site, no guarding rigidity  Neuro: a/o x4 quite pleasant  Ext: warm, 2+ distal pulses, 2+ pitting edema BLE    VS   temp 36.6, HR 103, r= 16, bp 104/68, 96% on 3L     Assessment  Known or suspected infection? no  Is sepsis with organ dysfunction present? no  Is septic shock* present?  no    Plan  Low concern at this time for sepsis, given exam, vitals and procedure (established tract no underlying infection)  Given her hx however if concern for sepsis would obtain blood cultures, lactate, early abx, and consider fluid bolus as tolerated 63ml/Kg.     Patient disposition?: [facility]

## 2017-08-09 NOTE — Progress Notes (Signed)
Palliative Care Progress Note    HPI: [name] w/ pancreatic CA s/p Whipple 1/25 (treatment for cure) w/ prolonged complicated hospitalization issues w/ infection, nutrition/appetite/nausea/constipation, s/p PEG w/ perf now resolved, most recent CT abd 5/16 showed no new fluid collections and decrease in existing hepatic collections. Pt resumed methadone on 5/8 and weaning off her dilaudid PCA 5/23. All drains now removed, receiving TFs at night to optimize appetite during day. Referrals for SNF rehab pending. Pt w/ signs of sepsis 5/24 & +BC w/ RLL consolidation on CT concerning for asp pna, abd CT w/o acute changes. Pt had improved w/ broadening of abx then developed fever 5/28 requiring addit WU and abx switches. Pt now off IV abx but having issues still w/ pain around PEG, using occas IV dilaudid prn, methadone increased on 6/6 and anticipate changing out PEG tubing to more flexible option hopefully more comfortable.    Subjective: "it hurts all over here" (pointing to mid to left side abd)    Objective: chart reviewed,  pt had sepsis alert in IR today when PEG tube changed out, visited today ~ 630 pm, she was sitting up in recliner, c/o abd pain, tender around PEG tube and was confused (before she took PO dilaudid); dtr [name] in room and asking about lab results, relayed CBC w/ WBC 8 and noted no lactate or bld cx had been sent as per rec's from critical care note - RN and covering provider made aware    Palliative Care ROS:  Pain   Moderate  Anorexia   Severe  Shortness of Breath   Mild  Tiredness/Fatigue   Moderate  Drowsiness/Sleepiness   Moderate  Constipation   no  Unable to Respond   no  Delirium   confused, resistant to taking po meds, trying to get pulse ox off and very forgetful (this was how she presented before taking PO dilaudid prn)   Last stool 6/7    Physical Examination:   BP: (100-126)/(52-78)   Temp:  [36 C (96.8 F)-37.5 C (99.5 F)]   Temp src: Temporal (06/07 1553)  Heart Rate:   [91-103]   Resp:  [16-28]   SpO2:  [85 %-100 %]   Gen appearance: older Caucasian female, sitting up in recliner, sleepy and disoriented  Lungs: easy resp,1 L NC   Heart: reg, tele w/ HR 98  Abd: large, soft, PEG intact/clamped -new tubing, dsg intact  Extrem: edematous LEs  Skin:  Dry, mildly reddened LEs  Neuro: pt oriented to self and dtr [name], confused about time and confused, telling nurse she can't swallow pills, trying to take pulse ox off saying they are 'collecting all her stickers' - dtr was able to reorient, pt not wanting to get up or ambulate today d/t pain    Assessment/Plan:  [name] w/ pancreatic CA s/p Whipple (treatment for cure) w/ prolonged hospitalization. Pt w/ recent set backs r/t infection, has been off IV abx x 3 days, unfortunately w/ fever, desat and confusion today concerning for early sepsis. S/p PEG tube change out in IR, still using occasional IV dilaudid prn in addition to PO dilaudid prn.     Pain/dyspnea  Dilaudid 8 mg po Q 2hrs prn (x 8 on 6/6, x 5 on 6/7)   Dilaudid 1 mg IV Q 2 hrs prn (x 1 on 6/7)  Lyrica 75 mg po BID for fibromyalgia (total 150 mg/d, increased 5/28)  Methadone 10 mg po TID (total 30 mg/d, increased last 6/6)  last ECG on 5/29 w/ QTc 426  Albuterol neb Q 6 hrs prn (none used)  Lidocaine patch daily x 2 (abd, LUE)  Acetaminophen 650 mg po Q 6 hrs prn fever   Bengay cream 4x/d prn to joints (x 2 on 6/5, x 2 on 6/6)  Guaifenesin 600 mg po BID expectorant    Anxiety/agitation/nausea/fatigue  Cymbalta 20 mg po/d  Metoclopramide 10 mg po TID AC  Pantoprazole 20 mg po daily  Ondansetron 4 mg IV Q 6 hrs prn (x 1 on 6/6)  Promethazine 12.5 mg IV Q 6 hrs prn (x 3 on 6/6)  Ativan 0.25 mg IV Q 6 hrs prn (last x 1 on 5/16)  Encouraged complementary adjuvant therapies for coping, meditation app, aromatherapy, gentle massage      Prevention of opioid induced constipation, last stool 6/7  Bisacodyl supp pr daily   Miralax 17 gm po daily  Senna 17.6 mg po BID    Skin/oral  care  Aquaphor prn   Eucerin prn for BLEs  Miconazole powder BID under breasts    GOC/prognosis  Full code  Dtr Rod Mae is HCP (very supportive and informed, former [facility] RN)  Goal currently is to prioritize pt's symptom control, long course though pt has endorsed agreement in past w/ continuing current treatment plan, dispo to SNF in hopes of eventually making recovery back to independent living    Pt case discussed w/ RNs, covering provider aware of pt's clinical status and concerns. We will continue to follow along. Please call if questions/concerns.    Total Time Spent 35 minutes:             >50% of time was spent in counseling and/or coordination of care.     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    Anemia D64.9    Hypothyroidism E03.9    Pancreatic cancer C25.9    Anemia D64.9     hx of Pulmonary embolism I26.99    Acute pulmonary insufficiency J98.4    Depression F32.9    Sepsis A41.9       No Known Allergies (drug, envir, food or latex)    Scheduled Meds:    pantoprazole  40 mg Oral BID AC    methadone  10 mg Oral TID    PROSOURCE NO CARB  30 mL Oral BID WC    pregabalin  75 mg Oral 2 times per day    amoxicillin-clavulanate  1 tablet Oral 2 times per day    lidocaine  1 patch Transdermal Q24H    furosemide  10 mg Oral BID    senna  2 tablet Oral 2 times per day    miconazole   Topical 2 times per day    guaiFENesin  600 mg Oral 2 times per day    sodium chloride  2 g Per G Tube TID PC    metoclopramide  10 mg Oral TID AC    levothyroxine  150 mcg Oral Daily @ 0600    enoxaparin  40 mg Subcutaneous Daily @ 2100    linezolid  600 mg Oral 2 times per day    PROSOURCE NO CARB  30 mL Oral Daily with breakfast     polyethylene glycol  17 g Oral Daily    lactobacillus rhamnosus (GG)  1 capsule Oral Daily    bisacodyl  10 mg Rectal Daily    DULoxetine  20 mg Oral Q24H       Continuous Infusions:    sodium chloride         PRN Meds:  naloxone, HYDROmorphone PF **OR** HYDROmorphone PF, promethazine, HYDROmorphone, ondansetron, albuterol, methyl salicylate-menthol, mineral oil-hydrophilic petrolatum, sodium chloride, sodium chloride

## 2017-08-09 NOTE — Progress Notes (Addendum)
Pt seen after notified by Lelon Frohlich NP and nursing for continued AMS at 1900. Amanda Galloway thought that today was her first day in the hospital, and was unable to recognize familiar staff. Per her daughter, Amanda Galloway has been "excessively twitching" since roughly 4pm, which she has never done before. Neuro exam WNL, with decreased movement in her left LE due to joint pain which has been an ongoing problem throughout hospital admission. VSS. Blood cultures and lactate ordered. Lactate back at 1.7. CXR done earlier was reviewed with improving pleural effusion. Augmentin switched to Zosyn, and pt given a 500cc NS bolus.     Rapid called at 2000 due to pt not arousing to nail pinch and sternal rub, as well as worsening mycolnic tremor. Pt received less pain medication than usual today and no sedation for G tube exchange, however had response when given 0.1 Narcan. Repeat labs WNL. Blood culture NGTD at this time.    Additional narcan given at 0100. She had some response, but was found to have increased confusion and tremor. ABG and ammonia levels obtained. Neuro consulted for tremor. Recommended head CT to rule out metastasis or other abnormality, though more likely toxic metabolic encephalopathy. Recommended holding off on lactulose until head CT back. Will continue to monitor    Amanda Galloway notified.     Chandlar Staebell R Haugh, PA

## 2017-08-09 NOTE — Preop H&P (Signed)
Interventional Radiology H&P    HPI : Amanda Galloway is a 72 y.o. female with a hx of gtube dependence who presents for gtube downsize due to peristomal leakage.     History:    Past Medical History:   Diagnosis Date    Acute kidney failure 03/31/2017    Cancer     Depression     Fibromyalgia     Hypothyroidism       Past Surgical History:   Procedure Laterality Date    CHOLECYSTECTOMY      CHOLECYSTECTOMY, LAPAROSCOPIC  09/06/2016    HYSTERECTOMY  08/1986    Fibroids    KNEE SURGERY Right     PR INSERT TUNNELED CV CATH WITH PORT Right 10/04/2016    Procedure: Right IJ MEDIPORT Insertion;  Surgeon: Delano Metz, MD;  Location: [facility] MAIN OR;  Service: Oncology General    PR LAP,DIAGNOSTIC ABDOMEN N/A 10/04/2016    Procedure: LAPAROSCOPY DIAGNOSTIC;  Surgeon: Delano Metz, MD;  Location: [facility] MAIN OR;  Service: Oncology General    PR PART [place] PANC,PROX+REMV DUOD+ANAST N/A 03/29/2017    Procedure: WHIPPLE PROCEDURE;  Surgeon: Delano Metz, MD;  Location: [facility] MAIN OR;  Service: Oncology General    ROTATOR CUFF REPAIR Right     TONSILLECTOMY AND ADENOIDECTOMY        Prescriptions Prior to Admission   Medication Sig    methadone (DOLOPHINE) 5 MG tablet Take 15 mg by mouth nightly    LORazepam (ATIVAN) 0.5 MG tablet Take 1 tablet (0.5 mg total) by mouth 2 times daily as needed for Anxiety   Max daily dose: 1 mg (Patient taking differently: Take 0.5 mg by mouth 2 times daily as needed for Anxiety   0.34m nightly and prn)    methadone (DOLOPHINE) 5 MG tablet Take 255min the morning, 2069mn afternoon and 62m44m night. (Patient taking differently: 10 mg 2 times daily   Take 20mg63mthe morning, 20mg 73mfternoon and 62mg a69mght.)    enoxaparin (LOVENOX) 100 mg/mL injection Inject 1 Syringe (100 mg total) into the skin daily (Patient taking differently: Inject 100 mg into the skin 2 times daily )    potassium chloride SA (KLOR-CON M20) 20 mEq  tablet Take 20 mEq by mouth 2 times daily    famotidine (PEPCID) 20 MG  tablet Take 1 tablet (20 mg total) by mouth 2 times daily    escitalopram (LEXAPRO) 20 MG tablet Take 1 tablet (20 mg total) by mouth daily (Patient taking differently: Take 20 mg by mouth nightly   )    amitriptyline (ELAVIL) 25 MG tablet Take 1 tablet (25 mg total) by mouth nightly    dexamethasone (DECADRON) 4 MG tablet Take 1 tablet (4 mg total) by mouth 2 times daily (Patient taking differently: Take 4 mg by mouth daily (with breakfast)   )    docusate sodium (COLACE) 100 MG capsule Take 2 capsules (200 mg total) by mouth 2 times daily    metoclopramide (REGLAN) 5 MG tablet Take 1 tablet (5 mg total) by mouth 3 times daily (before meals)    pantoprazole (PROTONIX) 40 MG EC tablet Take 1 tablet (40 mg total) by mouth 2 times daily (before meals)   Swallow whole. Do not crush, break, or chew.    senna (SENOKOT) 8.6 MG tablet Take 2 tablets by mouth 2 times daily    PREMARIN 0.625 MG tablet Take 0.625 mg by mouth every morning  ondansetron (ZOFRAN-ODT) 4 MG disintegrating tablet Take 1 tablet (4 mg total) by mouth 3 times daily as needed for Nausea   Place on top of tongue.    levothyroxine (SYNTHROID, LEVOTHROID) 137 MCG tablet Take 1 tablet (137 mcg total) by mouth daily (before breakfast)    Vitamins - senior (CENTRUM SILVER) TABS Take 1 tablet by mouth daily (Patient taking differently: Take 1 tablet by mouth every morning   )    triamterene-hydrochlorothiazide (MAXZIDE-25) 37.5-25 MG per tablet Take 1 tablet by mouth daily (Patient taking differently: Take 1 tablet by mouth every morning   )    fluorouracil (ADRUCIL) 50 MG/ML injection Administer 3,477 mg into the vein every 14 days   Start in infusion center.  Infuse over 46 hours at home.    pegfilgrastim (NEULASTA) 6 MG/0.6ML injection syringe Inject 0.6 mLs (6 mg total) into the skin once    Meds Exist in Therapy Plan Peg-Filgrastim    heparin 100 UNIT/ML injection 5 mLs (500 Units total) by Intracatheter route as needed (for line  care)    sodium chloride 0.9 % flush 10 mLs by Intracatheter route as needed (for line care)     Current Medications:    methadone  10 mg Oral TID    pantoprazole  40 mg Oral QAM    PROSOURCE NO CARB  30 mL Oral BID WC    pregabalin  75 mg Oral 2 times per day    amoxicillin-clavulanate  1 tablet Oral 2 times per day    lidocaine  1 patch Transdermal Q24H    furosemide  10 mg Oral BID    senna  2 tablet Oral 2 times per day    miconazole   Topical 2 times per day    guaiFENesin  600 mg Oral 2 times per day    sodium chloride  2 g Per G Tube TID PC    metoclopramide  10 mg Oral TID AC    levothyroxine  150 mcg Oral Daily @ 0600    enoxaparin  40 mg Subcutaneous Daily @ 2100    linezolid  600 mg Oral 2 times per day    PROSOURCE NO CARB  30 mL Oral Daily with breakfast    polyethylene glycol  17 g Oral Daily    lactobacillus rhamnosus (GG)  1 capsule Oral Daily    bisacodyl  10 mg Rectal Daily    DULoxetine  20 mg Oral Q24H     No Known Allergies (drug, envir, food or latex)   Social History   Substance Use Topics    Smoking status: Never Smoker    Smokeless tobacco: Never Used    Alcohol use No      Family History   Problem Relation Age of Onset    Breast cancer Mother         died 74    Cancer Father         NHL, died 44    Heart Disease Father     Diabetes Sister     Obesity Sister         4 siblings        Objective:     BP: (100-126)/(52-62)   Temp:  [35.9 C (96.6 F)-37.5 C (99.5 F)]   Temp src: Oral (06/07 1407)  Heart Rate:  [90-101]   Resp:  [16]   SpO2:  [85 %-100 %]     I/O this shift:  06/07 0700 - 06/07 1459  In: 120 (1.4 mL/kg) [P.O.:60; NG/GT:60]  Out: 125 (1.5 mL/kg) [Urine:125]  Net: -5  Weight: 84.9 kg       Physical Exam:    General: NAD  HEENT: MMM, no O/P lesions  Neck: supple, JVP non-elevated  Cardiac: RRR, S1S2, no M/G/R  Lungs: clear to auscultation bilaterally  Abdomen: soft, nontender, nondistended, normal bowel sounds  Extremities: no LEE, good distal  pulses  Neuro: grossly intact    Labs:  Recent Results (from the past 24 hour(s))   Protime-INR    Collection Time: 08/08/17  4:47 PM   Result Value    Protime 13.0 (H)    INR 1.1   Basic metabolic panel    Collection Time: 08/08/17  4:47 PM   Result Value    Glucose 117 (H)    Sodium 137    Potassium 4.5    Chloride 96    CO2 30 (H)    Anion Gap 11    UN 12    Creatinine 0.75    GFR,Caucasian 80    GFR,Black 93    Calcium 9.0   CBC    Collection Time: 08/08/17  4:47 PM   Result Value    WBC 7.6    RBC 2.6 (L)    Hemoglobin 8.0 (L)    Hematocrit 26 (L)    MCV 98 (H)    MCH 30    MCHC 31 (L)    RDW 19.9 (H)    Platelets 311   Magnesium    Collection Time: 08/08/17 11:55 PM   Result Value    Magnesium 1.9   Phosphorus    Collection Time: 08/08/17 11:55 PM   Result Value    Phosphorus 4.7 (H)   Protime-INR    Collection Time: 08/08/17 11:55 PM   Result Value    Protime 13.4 (H)    INR 1.2 (H)   CBC and differential    Collection Time: 08/08/17 11:55 PM   Result Value    WBC 7.7    RBC 2.6 (L)    Hemoglobin 7.8 (L)    Hematocrit 25 (L)    MCV 98 (H)    MCH 30    MCHC 31 (L)    RDW 19.3 (H)    Platelets 317    Seg Neut % 66.1    Lymphocyte % 16.9    Monocyte % 10.7    Eosinophil % 4.5    Basophil % 1.0    Neut # K/uL 5.1    Lymph # K/uL 1.3    Mono # K/uL 0.8    Eos # K/uL 0.4    Baso # K/uL 0.1    Nucl RBC % 0.0    Nucl RBC # K/uL 0.0    IMM Granulocytes # 0.1    IMM Granulocytes 0.8   Comprehensive metabolic panel    Collection Time: 08/08/17 11:55 PM   Result Value    Sodium 138    Potassium 4.3    Chloride 97    CO2 29 (H)    Anion Gap 12    UN 13    Creatinine 0.75    GFR,Caucasian 80    GFR,Black 93    Glucose 118 (H)    Calcium 9.2    Total Protein 7.3    Albumin 3.3 (L)    Bilirubin,Total 0.4    AST 64 (H)    ALT 65 (H)    Alk Phos 1,056 (H)  Bilirubin, direct    Collection Time: 08/08/17 11:55 PM   Result Value    Bilirubin,Direct 0.3           Imaging:      Assessment and Plan:    72 y.o. female with a hx of  gtube dependence presents for donwsize-25f to 151f.       NaMaryanna ShapeMD   Interventional Radiology     08/09/2017 at 2:27 PM

## 2017-08-09 NOTE — Plan of Care (Signed)
Bowel Elimination     Elimination patterns are normal or improving Maintaining        Cognitive function     Cognitive function will be maintained Maintaining     Cognitive function will be maintained Maintaining     Cognitive function will be maintained or return to baseline Maintaining        Fluid and Electrolyte Imbalance     Fluid and Electrolyte imbalance Maintaining        GI Bleeding Elimination     Elimination of patterns are normal or improving Maintaining        Mobility     Functional status is maintained or improved - Geriatric Maintaining     Patient's functional status is maintained or improved Maintaining        Nutrition     Patient's nutritional status is maintained or improved Maintaining     Nutritional status is maintained or improved - Geriatric Maintaining        Pain/Comfort     Patient's pain or discomfort is manageable Maintaining     Patient's pain or discomfort is manageable Maintaining        Post-Operative Bladder Elimination     Patient is able to empty bladder or return to baseline Maintaining        Post-Operative Bowel Elimination     Elimination pattern is normal or improving Maintaining        Post-Operative Complications     Prevent post-operative complications Maintaining        Post-Operative Hemodynamic Stability     Maintain Hemodynamic Stability Maintaining        Psychosocial     Demonstrates ability to cope with illness Maintaining        Safety     Patient will remain free of falls Maintaining

## 2017-08-09 NOTE — H&P (View-Only) (Signed)
Interventional Radiology H&P    HPI : Amanda Galloway is a 72 y.o. female with a hx of gtube dependence who presents for gtube downsize due to peristomal leakage.     History:    Past Medical History:   Diagnosis Date    Acute kidney failure 03/31/2017    Cancer     Depression     Fibromyalgia     Hypothyroidism       Past Surgical History:   Procedure Laterality Date    CHOLECYSTECTOMY      CHOLECYSTECTOMY, LAPAROSCOPIC  09/06/2016    HYSTERECTOMY  08/1986    Fibroids    KNEE SURGERY Right     PR INSERT TUNNELED CV CATH WITH PORT Right 10/04/2016    Procedure: Right IJ MEDIPORT Insertion;  Surgeon: Delano Metz, MD;  Location: [facility] MAIN OR;  Service: Oncology General    PR LAP,DIAGNOSTIC ABDOMEN N/A 10/04/2016    Procedure: LAPAROSCOPY DIAGNOSTIC;  Surgeon: Delano Metz, MD;  Location: [facility] MAIN OR;  Service: Oncology General    PR PART [place] PANC,PROX+REMV DUOD+ANAST N/A 03/29/2017    Procedure: WHIPPLE PROCEDURE;  Surgeon: Delano Metz, MD;  Location: [facility] MAIN OR;  Service: Oncology General    ROTATOR CUFF REPAIR Right     TONSILLECTOMY AND ADENOIDECTOMY        Prescriptions Prior to Admission   Medication Sig    methadone (DOLOPHINE) 5 MG tablet Take 15 mg by mouth nightly    LORazepam (ATIVAN) 0.5 MG tablet Take 1 tablet (0.5 mg total) by mouth 2 times daily as needed for Anxiety   Max daily dose: 1 mg (Patient taking differently: Take 0.5 mg by mouth 2 times daily as needed for Anxiety   0.34m nightly and prn)    methadone (DOLOPHINE) 5 MG tablet Take 255min the morning, 2069mn afternoon and 62m44m night. (Patient taking differently: 10 mg 2 times daily   Take 20mg63mthe morning, 20mg 73mfternoon and 62mg a69mght.)    enoxaparin (LOVENOX) 100 mg/mL injection Inject 1 Syringe (100 mg total) into the skin daily (Patient taking differently: Inject 100 mg into the skin 2 times daily )    potassium chloride SA (KLOR-CON M20) 20 mEq  tablet Take 20 mEq by mouth 2 times daily    famotidine (PEPCID) 20 MG  tablet Take 1 tablet (20 mg total) by mouth 2 times daily    escitalopram (LEXAPRO) 20 MG tablet Take 1 tablet (20 mg total) by mouth daily (Patient taking differently: Take 20 mg by mouth nightly   )    amitriptyline (ELAVIL) 25 MG tablet Take 1 tablet (25 mg total) by mouth nightly    dexamethasone (DECADRON) 4 MG tablet Take 1 tablet (4 mg total) by mouth 2 times daily (Patient taking differently: Take 4 mg by mouth daily (with breakfast)   )    docusate sodium (COLACE) 100 MG capsule Take 2 capsules (200 mg total) by mouth 2 times daily    metoclopramide (REGLAN) 5 MG tablet Take 1 tablet (5 mg total) by mouth 3 times daily (before meals)    pantoprazole (PROTONIX) 40 MG EC tablet Take 1 tablet (40 mg total) by mouth 2 times daily (before meals)   Swallow whole. Do not crush, break, or chew.    senna (SENOKOT) 8.6 MG tablet Take 2 tablets by mouth 2 times daily    PREMARIN 0.625 MG tablet Take 0.625 mg by mouth every morning  ondansetron (ZOFRAN-ODT) 4 MG disintegrating tablet Take 1 tablet (4 mg total) by mouth 3 times daily as needed for Nausea   Place on top of tongue.    levothyroxine (SYNTHROID, LEVOTHROID) 137 MCG tablet Take 1 tablet (137 mcg total) by mouth daily (before breakfast)    Vitamins - senior (CENTRUM SILVER) TABS Take 1 tablet by mouth daily (Patient taking differently: Take 1 tablet by mouth every morning   )    triamterene-hydrochlorothiazide (MAXZIDE-25) 37.5-25 MG per tablet Take 1 tablet by mouth daily (Patient taking differently: Take 1 tablet by mouth every morning   )    fluorouracil (ADRUCIL) 50 MG/ML injection Administer 3,477 mg into the vein every 14 days   Start in infusion center.  Infuse over 46 hours at home.    pegfilgrastim (NEULASTA) 6 MG/0.6ML injection syringe Inject 0.6 mLs (6 mg total) into the skin once    Meds Exist in Therapy Plan Peg-Filgrastim    heparin 100 UNIT/ML injection 5 mLs (500 Units total) by Intracatheter route as needed (for line  care)    sodium chloride 0.9 % flush 10 mLs by Intracatheter route as needed (for line care)     Current Medications:    methadone  10 mg Oral TID    pantoprazole  40 mg Oral QAM    PROSOURCE NO CARB  30 mL Oral BID WC    pregabalin  75 mg Oral 2 times per day    amoxicillin-clavulanate  1 tablet Oral 2 times per day    lidocaine  1 patch Transdermal Q24H    furosemide  10 mg Oral BID    senna  2 tablet Oral 2 times per day    miconazole   Topical 2 times per day    guaiFENesin  600 mg Oral 2 times per day    sodium chloride  2 g Per G Tube TID PC    metoclopramide  10 mg Oral TID AC    levothyroxine  150 mcg Oral Daily @ 0600    enoxaparin  40 mg Subcutaneous Daily @ 2100    linezolid  600 mg Oral 2 times per day    PROSOURCE NO CARB  30 mL Oral Daily with breakfast    polyethylene glycol  17 g Oral Daily    lactobacillus rhamnosus (GG)  1 capsule Oral Daily    bisacodyl  10 mg Rectal Daily    DULoxetine  20 mg Oral Q24H     No Known Allergies (drug, envir, food or latex)   Social History   Substance Use Topics    Smoking status: Never Smoker    Smokeless tobacco: Never Used    Alcohol use No      Family History   Problem Relation Age of Onset    Breast cancer Mother         died 74    Cancer Father         NHL, died 44    Heart Disease Father     Diabetes Sister     Obesity Sister         4 siblings        Objective:     BP: (100-126)/(52-62)   Temp:  [35.9 C (96.6 F)-37.5 C (99.5 F)]   Temp src: Oral (06/07 1407)  Heart Rate:  [90-101]   Resp:  [16]   SpO2:  [85 %-100 %]     I/O this shift:  06/07 0700 - 06/07 1459  In: 120 (1.4 mL/kg) [P.O.:60; NG/GT:60]  Out: 125 (1.5 mL/kg) [Urine:125]  Net: -5  Weight: 84.9 kg       Physical Exam:    General: NAD  HEENT: MMM, no O/P lesions  Neck: supple, JVP non-elevated  Cardiac: RRR, S1S2, no M/G/R  Lungs: clear to auscultation bilaterally  Abdomen: soft, nontender, nondistended, normal bowel sounds  Extremities: no LEE, good distal  pulses  Neuro: grossly intact    Labs:  Recent Results (from the past 24 hour(s))   Protime-INR    Collection Time: 08/08/17  4:47 PM   Result Value    Protime 13.0 (H)    INR 1.1   Basic metabolic panel    Collection Time: 08/08/17  4:47 PM   Result Value    Glucose 117 (H)    Sodium 137    Potassium 4.5    Chloride 96    CO2 30 (H)    Anion Gap 11    UN 12    Creatinine 0.75    GFR,Caucasian 80    GFR,Black 93    Calcium 9.0   CBC    Collection Time: 08/08/17  4:47 PM   Result Value    WBC 7.6    RBC 2.6 (L)    Hemoglobin 8.0 (L)    Hematocrit 26 (L)    MCV 98 (H)    MCH 30    MCHC 31 (L)    RDW 19.9 (H)    Platelets 311   Magnesium    Collection Time: 08/08/17 11:55 PM   Result Value    Magnesium 1.9   Phosphorus    Collection Time: 08/08/17 11:55 PM   Result Value    Phosphorus 4.7 (H)   Protime-INR    Collection Time: 08/08/17 11:55 PM   Result Value    Protime 13.4 (H)    INR 1.2 (H)   CBC and differential    Collection Time: 08/08/17 11:55 PM   Result Value    WBC 7.7    RBC 2.6 (L)    Hemoglobin 7.8 (L)    Hematocrit 25 (L)    MCV 98 (H)    MCH 30    MCHC 31 (L)    RDW 19.3 (H)    Platelets 317    Seg Neut % 66.1    Lymphocyte % 16.9    Monocyte % 10.7    Eosinophil % 4.5    Basophil % 1.0    Neut # K/uL 5.1    Lymph # K/uL 1.3    Mono # K/uL 0.8    Eos # K/uL 0.4    Baso # K/uL 0.1    Nucl RBC % 0.0    Nucl RBC # K/uL 0.0    IMM Granulocytes # 0.1    IMM Granulocytes 0.8   Comprehensive metabolic panel    Collection Time: 08/08/17 11:55 PM   Result Value    Sodium 138    Potassium 4.3    Chloride 97    CO2 29 (H)    Anion Gap 12    UN 13    Creatinine 0.75    GFR,Caucasian 80    GFR,Black 93    Glucose 118 (H)    Calcium 9.2    Total Protein 7.3    Albumin 3.3 (L)    Bilirubin,Total 0.4    AST 64 (H)    ALT 65 (H)    Alk Phos 1,056 (H)  Bilirubin, direct    Collection Time: 08/08/17 11:55 PM   Result Value    Bilirubin,Direct 0.3           Imaging:      Assessment and Plan:    72 y.o. female with a hx of  gtube dependence presents for donwsize-25f to 151f.       NaMaryanna ShapeMD   Interventional Radiology     08/09/2017 at 2:27 PM

## 2017-08-09 NOTE — Consults (Signed)
Medical Nutrition Therapy - Follow Up    Admit Date: 03/29/2017    Patient Summary: Amanda Galloway is a 72 y.o. female with h/o recent PE and pancreatic cancer POD # 102status post whipple procedure complicated by delayed gastric emptying. NGT replaced on 04/04/17. UGI on 04/12/17 concerning for obstruction just beyond the Braham in the efferent limb. Course complicated on 9/44/46 by rising LFTs and sepsis with CT concerning for cholangitis given biliary tree obstruction and PV compression due to hematoma and afferent limb distention in setting of PE treatment. Is s/p IR PTC on 04/16/17 and IR percutaneous aspiration of anterior abdominal fluid collection on 04/24/17. IR LUQ perc drain placement 3/1. IR anterior abdominal 32f perc drain placement 3/3. IR anterior perc drain into left hepatic collection 3/6. Developed E. Coli bacteremia on 07/26/17 presumed secondary to aspiration pneumonia. SNF pending clinical improvement.     Pertinent Meds: reviewed; Augmentin, bowel regimen, lasix, culturelle, levothyroxine, linezolid, reglan, NCl tablets, Protonix  Pertinent Labs: reviewed; CO2 29 (H), PO4 4.7 (H), Glu 117-118    Reviewed I/O's: 6/6 PO intake 120, UOP 725, BM x1    Enteral or parenteral access: PICC, PEG    Food allergies: NKFA    Current diet: NPO as of 6/7  Supplements: ProSource BID, Ensure Compact QID    Nutrition Focused Physical Exam:    Edema: generalized trace; abdominal trace, RUE +1, LUE +1, RLE +2, LLE +2 per RN shift assessment.  Abdomen: surgical scar; tenderness-guarding; hypoactive BS; +flatus per RN shift assessment.  Skin: Bruising per RN shift assessment.    Anthropometrics:  Height: 170.2 cm (5' 7")    Current Weight: 84.9 kg (187 lb 2.7 oz) ; 117% IBW  Ideal Body Weight: 72.4 kg + 10%  BMI: Body mass index is 29.31 kg/m. overweight  Weight Hx: wt appears stable--but with fluid overload  6/4 84.9 kg  5/12 85 kg    Estimated Nutrient Needs:  (Based on low weight of admission, 64.9 kg; close to being  a dry weight)  1625-1950kcal/day (25-30kcal/kg)  98-130g protein/day (1.3-1.8g/kg)    1625-1950 mL/day (25-30 mL/kg)    Calorie Counts:  Date Energy (kcal) Protein (g) Comments   6/4 985 44    6/5 893 26    6/6 867 64 Pt drank Ensure compact   Average 915 50        Nutrition Assessment and Diagnosis:   SNF placement pending clinical improvement. Receiving pallative care assistance. Per calorie count, pt meeting 51% minimal kcal and 31% of minimal protein needs. Holding TF 6/4 in hopes of increasing PO intake per MD note 6/4. Would consider restarting TF if pt PO does not increase. Writer visited pt twice, but pt was asleep and would not respond to her name. Plan for G-tube downsize in IR today.Most current TF regimen (held on 6/4): TwoCal HN @ 65 ml/hr x 12 hours. Flush with 30cc NS every 4 hours provides 1560 kcal, 65 g protein and 726 ml free water daily. Pt would benefit from calorie count to monitor supplement acceptance.     Malnutrition Status: Pt diagnosed with moderate malnutrition on 04/09/17.     Nutrition Intervention:   1. Continue Regular diet.  2. Continue ProSource TID.  3. Continue Ensure compact QID.  4. Would recommend consider re-starting TF if PO does not increase as supplement to PO intake.   1.  Recommend TwoCal @ 60 mL/hr x 12 hours. FWF 30 cc q4h. This will provide 1080 kcal/d, 60 g  protein/d, 594 mL/d free water.  5. Recommend calorie count x3 days.  6. Continue to monitor I/Os, % PO intake, and weekly wts.   7. Continue to monitor BMP, Mg, and PO4 daily. Replete prn.    Nutrition Monitoring/Evaluation:   1. Will monitor diet tolerance and intake, nutrition-related labs, weight trend, BM pattern, and supplement acceptance.   2. Will follow up per high nutrition risk protocol.    Foster Simpson  Dietetic Intern  Pager # [phone]

## 2017-08-09 NOTE — Progress Notes (Signed)
Assumed care of pt at 1500. Throughout duration of shift, pt noted to be increasingly lethargic and was having muscle twitches. Haugh, PA notified. Around ~2230, Probation officer in to see pt. Pt extremely difficult to arouse, only opening her eyes to sternal rubbing. Writer notified Haugh, PA and rapid response called. Pt was slid into bed from chair via hover matt. EKG, labs and vitals all taken. Pt given .1mg  of narcan, was then less lethargic and much more responsive. Pt now A&O x3, awake and alert. Full report given to oncoming shift who will continue to monitor.    Ivan Anchors, RN

## 2017-08-09 NOTE — Progress Notes (Addendum)
Writer met patient out in recovery area.  Two patient identifier checked. Writer asked patient why she came to IR today, she said she was unsure. Writer asked patient about her feeding tube and the patient stated she did not have one. Pt brought in room, procedure was verified. Writer called [facility] to question pt's basline mental status, charge RN said patient has a history of confusion with sepsis and frequent bouts of sepsis. Pt placed on monitor, most notable O2 saturation steadily decreased even with increasing O2 flow rates. Jarrett Soho (IR Charge made aware, Jarrett Soho called floor and sepsis team). G tube exchaged, sepsis team evaluated patient in IR. Pt transported back to [facility] with IR nursing, floor nursing, and provider on 3 lead EKG and continuous O2 monitoring.        08/09/17 1403 08/09/17 1407 08/09/17 1410   Vital Signs   Temp 37.5 C (99.5 F) 37.4 C (99.3 F) --    Temp src TEMPORAL Oral --    Heart Rate 99 101 --    Heart Rate Source Monitor Monitor --    Resp (!) 26 24 (!) 28   BP 120/62 114/61 --    Patient Position Lying Lying Lying   Oxygen Therapy   SpO2 90 % (!) 88 % (!) 85 %   O2 Device O2 Therapy O2 Therapy O2 Therapy   O2 Therapy --  --  --    O2 Flow Rate 4 L/min 4 L/min 6 L/min       08/09/17 1415   Vital Signs   Temp --    Temp src --    Heart Rate 99   Heart Rate Source Monitor   Resp (!) 26   BP 126/52   Patient Position Lying   Oxygen Therapy   SpO2 100 %   O2 Device O2 Therapy   O2 Therapy Non-Rebreather mask   O2 Flow Rate 15 L/min

## 2017-08-09 NOTE — Interval H&P Note (Signed)
UPDATES TO PATIENT'S CONDITION on the DAY OF SURGERY/PROCEDURE    I. Updates to Patient's Condition (to be completed by a provider privileged to complete a H&P, following reassessment of the patient by the provider):    Day of Surgery/Procedure Update:  History  History reviewed and no change    Physical  Physical exam updated and no change            II. Procedure Readiness   I have reviewed the patient's H&P and updated condition. By completing and signing this form, I attest that this patient is ready for surgery/procedure.    III. Attestation   I have reviewed the updated information regarding the patient's condition and it is appropriate to proceed with the planned surgery/procedure.    Maryanna Shape, MD as of 2:28 PM 08/09/2017

## 2017-08-09 NOTE — Progress Notes (Signed)
[facility] Surgery Brief Progress Note    Made aware that sepsis team was called to IR for Ms. Frampton during her G-tube exchanged. Patient reportedly confused, Temp 37.4, HR 101, RR 24, Sat 85%. Went down to assess situation. Upon arrival, patient was conversational and appropriate with O2 sat at 100% on 2L NC. She denied f/c, n/v, CP, SOB, dizziness or lightheadedness. She was able to articulate what part of the hospital she was in and what procedure she just had done.     Vitals:    08/09/17 1452   BP: 102/78   Pulse: 102   Resp: 16   Temp: 36.1 C (97 F)   Weight:    Height:      PE:    General: NAD, on 2L NC  HENT: EOMI, PERRLA, symmetric   Resp: Good respiratory effort, nonlabored breathing,   Card: RRR  Abdomen: Soft, nondistended, appropriately tender at g tube site  Extremities: b/l LE edema   Neuro: A&Ox3, CN II-XII intact, follows commands    A/P  72yo female with h/o recurrent PE and pancreatic cancer s/p whipple 03/29/17 with a prolonged hospital course who had a desaturation event during a Gtube exchange with IR. Patient quickly returned to baseline, appeared to be at her baseline upon exam.      --CXR, BMP, CBC w/diff  --willl continue to monitor    Author: Melburn Hake, MD  as of: 08/09/2017  at: 2:56 PM

## 2017-08-09 NOTE — Procedures (Signed)
Procedure Report           Time out documentation completed prior to procedure:  Yes    Indications/Pre-Procedure diagnosis:  Peristomal leakage. gtube downsize is requested.     Procedure performed: IR Feeding Tube Change    Guide Wire Removed:Yes    Findings/Procedure Summary (detailed report located in the Image tab): new 16Fr gtube was placed    Complications:  None     Condition:  stable    EBL: none     Specimens:  N/A    Operators: Dr. Alonza Smoker    Disposition:  Discharge to floor    Post-Procedure Diagnosis:  new 16Fr gtube was placed    Maryanna Shape, MD  08/09/2017  2:29 PM

## 2017-08-09 NOTE — Progress Notes (Addendum)
Surgical Oncology Surgery Progress Note    Patient: Amanda Galloway    LOS: 133 days    Attending: [place]. AFVSS. Resting comfortably this AM with no pain. Minimal PO intake yesterday.  Plan for G-tube downsizing with IR today. Denies f/c, v, CP or SOB.     OBJECTIVE  Physical Exam:  Temp:  [35.9 C (96.6 F)-37.2 C (99 F)] 36.2 C (97.2 F)  Heart Rate:  [90-95] 95  Resp:  [16] 16  BP: (110-120)/(60-64) 110/60   GEN: no apparent distress  HEENT: NCAT, face symmetric  CHEST: nonlabored respirations on 2LNC   ABD: soft, mildly distended, tender surrounding G-tube that is CDI, minimal erythema, without active leaking   NEURO/MOTOR: alert, appropriate  EXTREMITIES: atraumatic  VASCULAR: feet wwp, 1+ peripheral edema in BLE    Intake/Output:  06/06 0700 - 06/07 0659  In: 571 [P.O.:420]  Out: 1350 [Urine:1350]     Medications:  Current Facility-Administered Medications   Medication    methadone (DOLOPHINE) tablet 10 mg    pantoprazole (PROTONIX) EC tablet 40 mg    HYDROmorphone (DILAUDID) injection 0.5 mg    Or    HYDROmorphone (DILAUDID) injection 1 mg    PROSOURCE NO CARB liquid LIQD 30 mL    pregabalin (LYRICA) capsule 75 mg    amoxicillin-clavulanate (AUGMENTIN) 875-125 MG per tablet 1 tablet    lidocaine (LIDODERM) 5 % patch 1 patch    furosemide (LASIX) tablet 10 mg    senna (SENOKOT) tablet 2 tablet    sodium chloride 0.9 % IV    promethazine (PHENERGAN) 12.5 mg in sodium chloride 0.9% 25 mL IVPB    miconazole (MICATIN) 2 % powder    HYDROmorphone (DILAUDID) tablet 8 mg    ondansetron (ZOFRAN) injection 4 mg    guaiFENesin (MUCINEX) 12 hr tablet 600 mg    sodium chloride tablet 2 g    metoclopramide (REGLAN) tablet 10 mg    levothyroxine (SYNTHROID, LEVOTHROID) tablet 150 mcg    enoxaparin (LOVENOX) injection 40 mg    linezolid (ZYVOX) tablet 600 mg    PROSOURCE NO CARB liquid LIQD 30 mL    albuterol (PROVENTIL) nebulization 2.5 mg    polyethylene  glycol (GLYCOLAX,MIRALAX) powder 17 g    lactobacillus rhamnosus (GG) (CULTURELLE) capsule 1 each    methyl salicylate-menthol (BENGAY) topical cream    bisacodyl (DULCOLAX) suppository 10 mg    DULoxetine (CYMBALTA) DR capsule 20 mg    mineral oil-hydrophilic petrolatum (AQUAPHOR) ointment    sodium chloride 0.9 % flush 10 mL    sodium chloride 0.9 % flush 10 mL     Facility-Administered Medications Ordered in Other Encounters   Medication    etomidate (AMIDATE) 2 mg/mL injection    rocuronium (ZEMURON) 10 mg/mL injection         Laboratory values:   Recent Labs      08/08/17   2355  08/08/17   1647   WBC  7.7  7.6   Hemoglobin  7.8*  8.0*   Hematocrit  25*  26*   Platelets  317  311   INR  1.2*  1.1     No components found with this basename: APTT, PT Recent Labs      08/08/17   2355  08/08/17   1647   08/06/17   2355   Sodium  138  137   < >  136   Potassium  4.3  4.5   < >  3.9   Chloride  97  96   < >  93*   CO2  29*  30*   < >  32*   UN  13  12   < >  13   Creatinine  0.75  0.75   < >  0.77   Glucose  118*  117*   < >  92   Calcium  9.2  9.0   < >  9.0   Magnesium  1.9   --    --   1.9   Phosphorus  4.7*   --    --   5.1*    < > = values in this interval not displayed.    Recent Labs      08/08/17   2355  08/08/17   0213   AST  64*  70*   ALT  65*  72*   Alk Phos  1,056*  990*   Bilirubin,Total  0.4  0.5   Bilirubin,Direct  0.3  0.3     Recent Labs      08/08/17   2355  08/08/17   0213   Total Protein  7.3  6.7   Albumin  3.3*  2.7*     No results for input(s): AMY, LIP in the last 72 hours.   GLUCOSE:   No results for input(s): PGLU in the last 72 hours.  Imaging: Ct Abdomen And Pelvis With Contrast    Result Date: 08/06/2017  1. Interval decrease in size of intrahepatic and perihepatic fluid collections. No new collection. 2. Unchanged bilateral pleural effusions and diffuse body wall edema. Mesenteric misting/edema. 3. Status post Whipple procedure. Persistent pneumobilia. 4. Indwelling gastrostomy  tube. END OF IMPRESSION I have personally reviewed the images and the Resident's/Fellow's interpretation and agree with or edited the findings. [facility] submits this DICOM format image data and final report to the [facility], an independent secure electronic health information exchange, on a reciprocally searchable basis (with patient authorization) for a minimum of 12 months after exam date.       ASSESSMENT  Amanda Galloway is a 72 y.o. female with h/o recent PE and pancreatic cancer POD # 102  status post whipple procedure complicated by delayed gastric emptying.  NGT replaced on 04/04/17. UGI on 04/12/17 concerning for obstruction just beyond the [place] in the efferent limb.  Course complicated on 9/93/71 by rising LFTs and sepsis with CT concerning for cholangitis given biliary tree obstruction and PV compression due to hematoma and afferent limb distention in setting of PE treatment. Is s/p IR PTC on 04/16/17 and IR percutaneous aspiration of anterior abdominal fluid collection on 04/24/17. IR LUQ perc drain placement 3/1. IR anterior abdominal 25f perc drain placement 3/3. IR anterior perc drain into left hepatic collection 3/6. Developed E. Coli bacteremia on 07/26/17 presumed secondary to aspiration pneumonia.    PLAN   ALP and transaminitis and TB downtrending, will continue to monitor    ABX: PO Augmentin and PO linezolid  NPO of IR procedure today, then continue regular diet, holding TF in hopes of increasing PO intake  Vent G-tube PRN nausea  C/w aggressive pulmonary toilet.   Bowel regimen   Analgesia with PO pain. Appreciate palliative care assistance   DVT ppx - Lovenox   OOB/ambulate, PT/OT   Dispo: SNF pending clinical improvment     Author: MMelburn Hake MD as of: 08/09/2017  at: 8:57 AM  [facility]-GI Attending Addendum:   I personally examined the patient, reviewed the notes, and discussed the plan of care with the residents and patient. Agree with detailed resident's note. Please see  the note above for details of history, exam, labs, assessment/plan which reflect my input.     72 year old female with pancreatic cancer status post pancreaticoduodenectomy complicated by fluid collections and malnutrition, and gastrostomy tube placement with perforation.     Delirium. Resolved.     Pulmonary effusions. Stable.   Pulmonary insufficiency with need for oxygen. Continue supportive care. Will hold on tapping effusion.   Malnutrition. Encouraging oral intake. Calorie counts. Gastrostomy tube change today without difficulty.   Sepsis with EColi Bacteremia due to biliary source/cholangitis. Sensitive to Augmentin. Will need long-term suppressive antibiotics.   Hyperglycemia with intermittent need for sliding scale.   Elevated liver enzymes related to recent cholangitis.   Anemia of chronic disease. Iron supplementation and oral dietary encouragement.     Delano Metz, MD, FACS  Hepatobiliary, Pancreas & GI Surgery

## 2017-08-10 ENCOUNTER — Inpatient Hospital Stay: Payer: Medicare (Managed Care)

## 2017-08-10 DIAGNOSIS — G92 Toxic encephalopathy: Secondary | ICD-10-CM

## 2017-08-10 DIAGNOSIS — G928 Other toxic encephalopathy: Secondary | ICD-10-CM | POA: Diagnosis not present

## 2017-08-10 LAB — COMPREHENSIVE METABOLIC PANEL
ALT: 56 U/L — ABNORMAL HIGH (ref 0–35)
ALT: 63 U/L — ABNORMAL HIGH (ref 0–35)
AST: 57 U/L — ABNORMAL HIGH (ref 0–35)
AST: 59 U/L — ABNORMAL HIGH (ref 0–35)
Albumin: 3.3 g/dL — ABNORMAL LOW (ref 3.5–5.2)
Albumin: 3.3 g/dL — ABNORMAL LOW (ref 3.5–5.2)
Alk Phos: 1020 U/L — ABNORMAL HIGH (ref 35–105)
Alk Phos: 1024 U/L — ABNORMAL HIGH (ref 35–105)
Anion Gap: 12 (ref 7–16)
Anion Gap: 13 (ref 7–16)
Bilirubin,Total: 0.5 mg/dL (ref 0.0–1.2)
Bilirubin,Total: 0.5 mg/dL (ref 0.0–1.2)
CO2: 29 mmol/L — ABNORMAL HIGH (ref 20–28)
CO2: 30 mmol/L — ABNORMAL HIGH (ref 20–28)
Calcium: 8.9 mg/dL (ref 8.6–10.2)
Calcium: 9.1 mg/dL (ref 8.6–10.2)
Chloride: 99 mmol/L (ref 96–108)
Chloride: 99 mmol/L (ref 96–108)
Creatinine: 0.78 mg/dL (ref 0.51–0.95)
Creatinine: 0.79 mg/dL (ref 0.51–0.95)
GFR,Black: 87 *
GFR,Black: 88 *
GFR,Caucasian: 76 *
GFR,Caucasian: 77 *
Glucose: 102 mg/dL — ABNORMAL HIGH (ref 60–99)
Glucose: 107 mg/dL — ABNORMAL HIGH (ref 60–99)
Lab: 13 mg/dL (ref 6–20)
Lab: 13 mg/dL (ref 6–20)
Potassium: 4.1 mmol/L (ref 3.3–5.1)
Potassium: 4.1 mmol/L (ref 3.3–5.1)
Sodium: 141 mmol/L (ref 133–145)
Sodium: 141 mmol/L (ref 133–145)
Total Protein: 7.3 g/dL (ref 6.3–7.7)
Total Protein: 7.4 g/dL (ref 6.3–7.7)

## 2017-08-10 LAB — CBC AND DIFFERENTIAL
Baso # K/uL: 0.1 10*3/uL (ref 0.0–0.1)
Basophil %: 0.8 %
Eos # K/uL: 0.4 10*3/uL (ref 0.0–0.4)
Eosinophil %: 5.5 %
Hematocrit: 26 % — ABNORMAL LOW (ref 34–45)
Hemoglobin: 8 g/dL — ABNORMAL LOW (ref 11.2–15.7)
IMM Granulocytes #: 0 10*3/uL
IMM Granulocytes: 0.5 %
Lymph # K/uL: 1.3 10*3/uL (ref 1.2–3.7)
Lymphocyte %: 17.2 %
MCH: 30 pg/cell (ref 26–32)
MCHC: 31 g/dL — ABNORMAL LOW (ref 32–36)
MCV: 98 fL — ABNORMAL HIGH (ref 79–95)
Mono # K/uL: 0.9 10*3/uL (ref 0.2–0.9)
Monocyte %: 11.9 %
Neut # K/uL: 4.8 10*3/uL (ref 1.6–6.1)
Nucl RBC # K/uL: 0 10*3/uL (ref 0.0–0.0)
Nucl RBC %: 0 /100 WBC (ref 0.0–0.2)
Platelets: 304 10*3/uL (ref 160–370)
RBC: 2.7 MIL/uL — ABNORMAL LOW (ref 3.9–5.2)
RDW: 19.9 % — ABNORMAL HIGH (ref 11.7–14.4)
Seg Neut %: 64.1 %
WBC: 7.5 10*3/uL (ref 4.0–10.0)

## 2017-08-10 LAB — URINALYSIS REFLEX TO CULTURE
Blood,UA: NEGATIVE
Ketones, UA: NEGATIVE
Leuk Esterase,UA: NEGATIVE
Nitrite,UA: NEGATIVE
Protein,UA: NEGATIVE mg/dL
Specific Gravity,UA: 1.015 (ref 1.002–1.030)
pH,UA: 6 (ref 5.0–8.0)

## 2017-08-10 LAB — BLOOD GAS, ARTERIAL
Base Excess, Arterial: 5 mmol/L — ABNORMAL HIGH (ref <=2)
CO2,ART (Calc): 32 mmol/L — ABNORMAL HIGH (ref 23–28)
CO: 1.1 %
FO2 Hb, Arterial: 96 % (ref 90–100)
HCO3, Arterial: 31 mmol/L — ABNORMAL HIGH (ref 21–26)
Hemoglobin: 8.1 g/dL — ABNORMAL LOW (ref 11.2–15.7)
Methemoglobin: 0.5 % (ref 0.0–1.0)
pCO2, Arterial: 51 mm Hg — ABNORMAL HIGH (ref 35–46)
pH: 7.4 (ref 7.35–7.45)
pO2,Arterial: 113 mm Hg — ABNORMAL HIGH (ref 80–100)

## 2017-08-10 LAB — BILIRUBIN, DIRECT: Bilirubin,Direct: 0.3 mg/dL (ref 0.0–0.3)

## 2017-08-10 LAB — AMMONIA: Ammonia: 51 umol/L — ABNORMAL HIGH (ref 10–47)

## 2017-08-10 MED ORDER — HYDROMORPHONE HCL 2 MG PO TABS *I*
2.0000 mg | ORAL_TABLET | Freq: Once | ORAL | Status: AC
Start: 2017-08-10 — End: 2017-08-10
  Administered 2017-08-10: 2 mg via ORAL
  Filled 2017-08-10: qty 1

## 2017-08-10 MED ORDER — LIDOCAINE 5 % EX PTCH *I*
1.0000 | MEDICATED_PATCH | CUTANEOUS | Status: DC
Start: 2017-08-10 — End: 2017-08-24
  Administered 2017-08-10 – 2017-08-23 (×14): 1 via TRANSDERMAL
  Filled 2017-08-10 (×15): qty 1

## 2017-08-10 MED ORDER — IOHEXOL 350 MG/ML (OMNIPAQUE) IV SOLN *I*
1.0000 mL | Freq: Once | INTRAVENOUS | Status: AC
Start: 2017-08-10 — End: 2017-08-10
  Administered 2017-08-10: 70 mL via INTRAVENOUS

## 2017-08-10 NOTE — Progress Notes (Signed)
08/09/17 2237   Reason for Activating Rapid Response Team   Reason for Activating RRT Acute Change in LOC/MS   RRT Initiated   Date 08/09/17   Time Called 2234   Activating Unit [facility]   Activated by RN   Arrival Time 2239   End Time 2325   Responding Unit [facility] RRT    Provider Notified at 2234   RRT Vitals    Temp 36.5 C (97.7 F)   Heart Rate 97   Resp 14   BP 100/60   SpO2 98 %   FiO2 2 %   Interventions/Recomendations   Interventions / Recomendations EKG;Monitor;Labs   Medications Given Other (Comment)   Comments Pt with increasing lethergy, on arrival pt in chair, unresponsive to stimuli, transfered to bed, given .1 mg narcan with effect, opening eyes able to follow commands, VSS, labs and EKG obtained.     Patient Outcome   Patient Stabilized Y   Blue 100 Called N   Code 15/ Stroke Team Called N   Patient Transfered to [employer] on Floor   Team Members   Assigned Nurse Ivan Anchors, RN   Rapid Response Team RN Bethel Born, RN, CRN   Rapid Response Team Provider Alyssa Haugh, [place]

## 2017-08-10 NOTE — Plan of Care (Signed)
Problem: Safety  Goal: Patient will remain free of falls  Outcome: Maintaining      Problem: Pain/Comfort  Goal: Patient's pain or discomfort is manageable  Outcome: Progressing towards goal      Problem: Mobility  Goal: Functional status is maintained or improved - Geriatric  Outcome: Progressing towards goal      Problem: Nutrition  Goal: Patient's nutritional status is maintained or improved  Outcome: Progressing towards goal    Goal: Nutritional status is maintained or improved - Geriatric  Outcome: Progressing towards goal      Problem: Cognitive function  Goal: Cognitive function will be maintained  Outcome: Progressing towards goal    Goal: Cognitive function will be maintained  Outcome: Progressing towards goal    Goal: Cognitive function will be maintained or return to baseline  Outcome: Progressing towards goal      Problem: Bowel Elimination  Goal: Elimination patterns are normal or improving  Outcome: Progressing towards goal      Problem: Post-Operative Hemodynamic Stability  Goal: Maintain Hemodynamic Stability  Outcome: Progressing towards goal      Problem: Post-Operative Complications  Goal: Prevent post-operative complications  Outcome: Progressing towards goal      Problem: Post-Operative Bowel Elimination  Goal: Elimination pattern is normal or improving  Outcome: Progressing towards goal      Problem: Psychosocial  Goal: Demonstrates ability to cope with illness  Outcome: Progressing towards goal      Problem: Fluid and Electrolyte Imbalance  Goal: Fluid and Electrolyte imbalance  Outcome: Progressing towards goal      Problem: GI Bleeding Elimination  Goal: Elimination of patterns are normal or improving  Outcome: Progressing towards goal      Problem: Post-Operative Bladder Elimination  Goal: Patient is able to empty bladder or return to baseline  Outcome: Progressing towards goal

## 2017-08-10 NOTE — Progress Notes (Signed)
Assumed care of pt at 0700. A&O x1 at that time. VSS. Lethargy starting to resolve. Up to commode several times. By afternoon, lethargy had mostly cleared. Pt A&O x3. NO c/o pain. 1700 Methadone dose held per Estevan Oaks, MD d/t hx of lethargy overnight. Will administer if pt begins to c/o pain and no signs of lethargy. Some PO intake with dinner. TFs restarted. Pt went outside in wheelchair with daughter to enjoy the day. Now OOB to chair. Pleasant and cooperative throughout shift. Pt resting comfortably and will continue to be monitored by nursing staff. Emi Belfast, RN

## 2017-08-10 NOTE — Progress Notes (Addendum)
Surgical Oncology Surgery Progress Note    Patient: Amanda Galloway    LOS: 134 days    Attending: [place]  G-tube exchanged yesterday in IR where patient became confused, desaturated. Nomralized but returned to floor and overnight had increasing confusion with tremor. Labs WNL, CXR improving pleural effusion. Given bolus and returned to Zosyn/Linezolid. CT Head ordered and obtained in AM.    Currently, Resting comfortably this AM with no pain.   Oriented to person only but pleasant and appropriate, recognizes Probation officer.  Minimal PO intake yesterday.    Denies f/c, v, CP or SOB.     OBJECTIVE  Physical Exam:  Temp:  [36 C (96.8 F)-37.5 C (99.5 F)] 36.7 C (98.1 F)  Heart Rate:  [93-109] 99  Resp:  [12-28] 12  BP: (100-150)/(52-86) 102/58  FiO2:  [1.5 %-3 %] 1.5 %   GEN: no apparent distress  HEENT: NCAT, face symmetric  CHEST: nonlabored respirations on 2LNC   ABD: soft, mildly distended, tender surrounding G-tube that is CDI, minimal erythema, without active leaking   NEURO/MOTOR: alert, appropriate  EXTREMITIES: atraumatic  VASCULAR: feet wwp, 1+ peripheral edema in BLE    Intake/Output:  06/07 0700 - 06/08 0659  In: 1598 [P.O.:900]  Out: 1475 [Urine:1475]     Medications:  Current Facility-Administered Medications   Medication    naloxone (NARCAN) 0.4 mg/mL injection 0.1 mg    pantoprazole (PROTONIX) EC tablet 40 mg    piperacillin-tazobactam (ZOSYN) IVPB 4.5 g    methadone (DOLOPHINE) tablet 10 mg    HYDROmorphone (DILAUDID) injection 0.5 mg    Or    HYDROmorphone (DILAUDID) injection 1 mg    PROSOURCE NO CARB liquid LIQD 30 mL    pregabalin (LYRICA) capsule 75 mg    lidocaine (LIDODERM) 5 % patch 1 patch    furosemide (LASIX) tablet 10 mg    senna (SENOKOT) tablet 2 tablet    sodium chloride 0.9 % IV    promethazine (PHENERGAN) 12.5 mg in sodium chloride 0.9% 25 mL IVPB    miconazole (MICATIN) 2 % powder    HYDROmorphone (DILAUDID) tablet 8 mg    ondansetron  (ZOFRAN) injection 4 mg    guaiFENesin (MUCINEX) 12 hr tablet 600 mg    sodium chloride tablet 2 g    metoclopramide (REGLAN) tablet 10 mg    levothyroxine (SYNTHROID, LEVOTHROID) tablet 150 mcg    enoxaparin (LOVENOX) injection 40 mg    linezolid (ZYVOX) tablet 600 mg    PROSOURCE NO CARB liquid LIQD 30 mL    albuterol (PROVENTIL) nebulization 2.5 mg    polyethylene glycol (GLYCOLAX,MIRALAX) powder 17 g    lactobacillus rhamnosus (GG) (CULTURELLE) capsule 1 each    methyl salicylate-menthol (BENGAY) topical cream    bisacodyl (DULCOLAX) suppository 10 mg    DULoxetine (CYMBALTA) DR capsule 20 mg    mineral oil-hydrophilic petrolatum (AQUAPHOR) ointment    sodium chloride 0.9 % flush 10 mL    sodium chloride 0.9 % flush 10 mL     Facility-Administered Medications Ordered in Other Encounters   Medication    etomidate (AMIDATE) 2 mg/mL injection    rocuronium (ZEMURON) 10 mg/mL injection         Laboratory values:   Recent Labs      08/10/17   0330  08/10/17   0021  08/09/17   1459  08/08/17   2355  08/08/17   1647   WBC   --  7.5  8.0  7.7  7.6   Hemoglobin  8.1*  8.0*  8.2*  7.8*  8.0*   Hematocrit   --   26*  27*  25*  26*   Platelets   --   304  335  317  311   INR   --    --    --   1.2*  1.1     No components found with this basename: APTT, PT Recent Labs      08/10/17   0021  08/09/17   2234   08/08/17   2355   Sodium  141  141   < >  138   Potassium  4.1  4.1   < >  4.3   Chloride  99  99   < >  97   CO2  30*  29*   < >  29*   UN  13  13   < >  13   Creatinine  0.79  0.78   < >  0.75   Glucose  102*  107*   < >  118*   Calcium  9.1  8.9   < >  9.2   Magnesium   --    --    --   1.9   Phosphorus   --    --    --   4.7*    < > = values in this interval not displayed.    Recent Labs      08/10/17   0021  08/09/17   2234   08/08/17   2355   AST  57*  59*   < >  64*   ALT  56*  63*   < >  65*   Alk Phos  1,024*  1,020*   < >  1,056*   Bilirubin,Total  0.5  0.5   < >  0.4   Bilirubin,Direct  0.3    --    --   0.3    < > = values in this interval not displayed.     Recent Labs      08/10/17   0021  08/09/17   2234   Total Protein  7.4  7.3   Albumin  3.3*  3.3*     No results for input(s): AMY, LIP in the last 72 hours.   GLUCOSE:   No results for input(s): PGLU in the last 72 hours.  Imaging: Ct Head Without And With Contrast    Result Date: 08/10/2017  No CT evidence of acute intracranial abnormality. Nonspecific white matter hypoattenuation, likely chronic microvascular gliosis. END OF IMPRESSION I have personally reviewed the images and the Resident's/Fellow's interpretation and agree with or edited the findings. [facility] submits this DICOM format image data and final report to the [facility], an independent secure electronic health information exchange, on a reciprocally searchable basis (with patient authorization) for a minimum of 12 months after exam date.    Ir Feeding Tube Change    Result Date: 08/09/2017  Successful exchange of gastrostomy tube with smaller [place] size G-tube with no immediate complications. A new 16 French G-tube was placed. END OF IMPRESSION [facility] submits this DICOM format image data and final report to the [facility], an independent secure electronic health information exchange, on a reciprocally searchable basis (with patient authorization) for a minimum of 12 months after exam date.    Chest Single Frontal View  Result Date: 08/09/2017  Interval decrease in small bilateral pleural fluid, left greater than right, with associated bilateral atelectasis. END OF IMPRESSION       ASSESSMENT  Amanda Galloway is a 72 y.o. female with h/o recent PE and pancreatic cancer POD # 102  status post whipple procedure complicated by delayed gastric emptying.  NGT replaced on 04/04/17. UGI on 04/12/17 concerning for obstruction just beyond the [place] in the efferent limb.  Course complicated on 2/77/41 by rising LFTs and sepsis with CT concerning for cholangitis given biliary tree obstruction  and PV compression due to hematoma and afferent limb distention in setting of PE treatment. Is s/p IR PTC on 04/16/17 and IR percutaneous aspiration of anterior abdominal fluid collection on 04/24/17. IR LUQ perc drain placement 3/1. IR anterior abdominal 72f perc drain placement 3/3. IR anterior perc drain into left hepatic collection 3/6. Developed E. Coli bacteremia on 07/26/17 presumed secondary to aspiration pneumonia.    PLAN   No pathology on CT Head, will monitor mental status. Appears to wax/wane.   Continue Zosyn and Linezolid, appreciate ID assistance  Regular diet with TF, at goal of 65 mL/12 Hr with NS Flush 30 cc every 4hr  Vent G-tube PRN nausea  C/w aggressive pulmonary toilet  Bowel regimen   Analgesia with PO pain. Appreciate palliative care assistance   DVT ppx - Lovenox   OOB/ambulate, PT/OT   Dispo: [id number]   Author: DKathrin Greathouse MD as of: 08/10/2017  at: 8:59 AM             HPB-GI Attending Addendum:   I personally examined the patient, reviewed the notes, and discussed the plan of care with the residents and patient. Agree with detailed resident's note. Please see the note above for details of history, exam, labs, assessment/plan which reflect my input.     72year old female with pancreatic cancer status post pancreaticoduodenectomy complicated by fluid collections and malnutrition, and gastrostomy tube placement with perforation.     Delirium. Resolved.     Pulmonary effusions. Stable.   Pulmonary insufficiency with need for oxygen. Continue supportive care. Will hold on tapping effusion.   Malnutrition. Encouraging oral intake. Calorie counts. Gastrostomy tube change today without difficulty.   Sepsis with EColi Bacteremia due to biliary source/cholangitis. Sensitive to Augmentin. Will need long-term suppressive antibiotics.   Hyperglycemia with intermittent need for sliding scale.   Elevated liver enzymes related to recent cholangitis.   Anemia of chronic disease. Iron  supplementation and oral dietary encouragement.     EDelano Metz MD, FACS  Hepatobiliary, Pancreas & GI Surgery       I saw and evaluated the patient. I agree with the resident's/fellow's findings and plan of care as documented above.    JVladimir Creeks MD

## 2017-08-10 NOTE — Progress Notes (Signed)
Respiratory Care Arterial Puncture Note  Universal and contact precautions observed.  Patient identification confirmed using two identifiers.  Patient noted to be on 2L NC with sats of 97%.  Modified Allen test performed with (+) refill.  Puncture site prepared using aseptic technique.  Sample obtained from  RIGHT radial artery using a heparinized syringe and 22G needle.  Pressure applied to puncture site for 5 minutes with no evidence of bleeding noted.  Bandage applied to puncture site.  Sample prepared, labeled and sent to lab for analysis.  Patient tolerated procedure without incidence.

## 2017-08-10 NOTE — Progress Notes (Signed)
Assumed Care at 2300-0700: Upon initial assessment pt demonstrated increased drowsiness, lethargy and nausea. Provider Haugh PA was notified that methadone was held however pt was c/o 7/10 pain. Provider ordered a 1x dose of 1mg  diluadid po. Upon adminstration pt had a bout of emesis and dilaudid was held and zofran was given w/ minimal effect. At [place], pt c/o of increased nausea and phenergan was administered w/ positive effect. At [employer] and Provider Haugh PA arrived at the bedside - pt demonstrated increased drowsiness and provider order 1x of narcan. At 0246 pt was bladder scanned for 560 cc's - provider Haugh notified and ordered straight cath which put out 725cc's, urinalysis and ABG. Pt was ordered a neurology consult and requested a STAT CT of head.     Oncoming shift notified and aware.     Sharyon Medicus, RN

## 2017-08-10 NOTE — Consults (Addendum)
NEUROLOGY Consult/Admission Note    Referring Provider/Service: Hepatobiliary, Pancreatic, and Gastrointestinal surgery    Reason for consult: Encephalopathy    History of Present Illness:  72 y.o. female with PMHx significant for depression, fibromyalgia, hypothyroidism and pancreatic adenocarcinoma s/p chemo and open Whipple on 03/29/2017 (has been hospitalized for 133 days) who developed acute change in mental status.    Patient has had a lengthy, complicated hospital course which is well-summarized in palliative care notes- most notably she has had many infectious setbacks, with multiple bouts of sepsis necessitating treatment with a wide variety of antibiotics, and fluid collections in requiring multiple drains. Of note primary team reports that prior sepsis episodes have been atypical in presentation, manifesting only as encephalopathy.     Patient had a reported acute change in mental status on Friday 6/7. She reportedly went for a G-tube exchange at 4pm, prior to which she was at her baseline mental status (which is reportedly normal). Per report she had a desaturation when she was laid flat on a stretcher (had not received any medications), and became confused. Her hypoxemia improved, though patient remained altered. Sepsis alert was subsequently called. Of note she was also later observed to have developed intermittent jerking movements of her extremities.    Her change in mental status on Friday was reportedly much more severe than those with prior episodes of sepsis. There were reportedly no new/different medication exposures suspicious for provoking this change in mental status. Patient takes a lot of narcotics at baseline for treatment of her pain, though reportedly received fewer pain medication doses on the day in question than is typical for her lowering primary team's suspicion that this was the cause (though she also reportedly responded well to narcan- per nursing note was "extremely difficult to  arouse, only opening her eyes to sternal rubbing", then received 0.1mg  narcan and was less lethargic, much more responsive). Other changes noted today include that patient had some urinary retention requiring straight cath, and also she was perceived to have new lower facial edema.    Review of Systems:  Unable to complete review of systems due to encephalopathy    Past Medical History:   Diagnosis Date    Acute kidney failure 03/31/2017    Cancer     Depression     Fibromyalgia     Hypothyroidism      Past Surgical History:   Procedure Laterality Date    CHOLECYSTECTOMY      CHOLECYSTECTOMY, LAPAROSCOPIC  09/06/2016    HYSTERECTOMY  08/1986    Fibroids    KNEE SURGERY Right     PR INSERT TUNNELED CV CATH WITH PORT Right 10/04/2016    Procedure: Right IJ MEDIPORT Insertion;  Surgeon: Delano Metz, MD;  Location: [facility] MAIN OR;  Service: Oncology General    PR LAP,DIAGNOSTIC ABDOMEN N/A 10/04/2016    Procedure: LAPAROSCOPY DIAGNOSTIC;  Surgeon: Delano Metz, MD;  Location: [facility] MAIN OR;  Service: Oncology General    PR PART [place] PANC,PROX+REMV DUOD+ANAST N/A 03/29/2017    Procedure: WHIPPLE PROCEDURE;  Surgeon: Delano Metz, MD;  Location: [facility] MAIN OR;  Service: Oncology General    ROTATOR CUFF REPAIR Right     TONSILLECTOMY AND ADENOIDECTOMY       Family History   Problem Relation Age of Onset    Breast cancer Mother         died 29    Cancer Father         NHL, died 61  Heart Disease Father     Diabetes Sister     Obesity Sister         4 siblings     Social History     Social History    Marital status: Divorced     Spouse name: N/A    Number of children: N/A    Years of education: N/A     Social History Main Topics    Smoking status: Never Smoker    Smokeless tobacco: Never Used    Alcohol use No    Drug use: No    Sexual activity: Not Asked     Other Topics Concern    None     Social History Narrative    None     No Known Allergies (drug, envir, food or latex)  Prior to Admission  Medications:  Prior to Admission medications    Medication Sig Start Date End Date Taking? Authorizing Provider   methadone (DOLOPHINE) 5 MG tablet Take 15 mg by mouth nightly   Yes [provider]   LORazepam (ATIVAN) 0.5 MG tablet Take 1 tablet (0.5 mg total) by mouth 2 times daily as needed for Anxiety   Max daily dose: 1 mg  Patient taking differently: Take 0.5 mg by mouth 2 times daily as needed for Anxiety   0.5mg  nightly and prn 02/27/17  Yes Ardith Dark, MD   methadone (DOLOPHINE) 5 MG tablet Take 20mg  in the morning, 20mg  in afternoon and 15mg  at night.  Patient taking differently: 10 mg 2 times daily   Take 20mg  in the morning, 20mg  in afternoon and 15mg  at night. 02/27/17  Yes Ardith Dark, MD   enoxaparin (LOVENOX) 100 mg/mL injection Inject 1 Syringe (100 mg total) into the skin daily  Patient taking differently: Inject 100 mg into the skin 2 times daily  02/27/17  Yes Hellems, Guinevere A, PA   potassium chloride SA (KLOR-CON M20) 20 mEq  tablet Take 20 mEq by mouth 2 times daily   Yes [provider]   famotidine (PEPCID) 20 MG tablet Take 1 tablet (20 mg total) by mouth 2 times daily 12/28/16  Yes Hellems, Guinevere A, PA   escitalopram (LEXAPRO) 20 MG tablet Take 1 tablet (20 mg total) by mouth daily  Patient taking differently: Take 20 mg by mouth nightly    12/28/16  Yes Hellems, Guinevere A, PA   amitriptyline (ELAVIL) 25 MG tablet Take 1 tablet (25 mg total) by mouth nightly 11/02/16  Yes Hellems, Guinevere A, PA   dexamethasone (DECADRON) 4 MG tablet Take 1 tablet (4 mg total) by mouth 2 times daily  Patient taking differently: Take 4 mg by mouth daily (with breakfast)    11/02/16  Yes Hellems, Guinevere A, PA   docusate sodium (COLACE) 100 MG capsule Take 2 capsules (200 mg total) by mouth 2 times daily 11/02/16  Yes Hellems, Guinevere A, PA   metoclopramide (REGLAN) 5 MG tablet Take 1 tablet (5 mg total) by mouth 3 times daily (before meals) 11/02/16  Yes Hellems, Guinevere  A, PA   pantoprazole (PROTONIX) 40 MG EC tablet Take 1 tablet (40 mg total) by mouth 2 times daily (before meals)   Swallow whole. Do not crush, break, or chew. 11/02/16  Yes Hellems, Guinevere A, PA   senna (SENOKOT) 8.6 MG tablet Take 2 tablets by mouth 2 times daily 11/02/16  Yes Hellems, Guinevere A, PA   PREMARIN 0.625 MG tablet Take 0.625 mg by mouth every morning  07/10/16  Yes [provider]   ondansetron (ZOFRAN-ODT) 4 MG disintegrating tablet Take 1 tablet (4 mg total) by mouth 3 times daily as needed for Nausea   Place on top of tongue. 10/16/16  Yes Kathlen Brunswick, MD   levothyroxine (SYNTHROID, LEVOTHROID) 137 MCG tablet Take 1 tablet (137 mcg total) by mouth daily (before breakfast) 10/13/16 03/27/17 Yes Timmothy Sours, NP   Vitamins - senior (CENTRUM SILVER) TABS Take 1 tablet by mouth daily  Patient taking differently: Take 1 tablet by mouth every morning    10/14/16  Yes Timmothy Sours, NP   triamterene-hydrochlorothiazide (MAXZIDE-25) 37.5-25 MG per tablet Take 1 tablet by mouth daily  Patient taking differently: Take 1 tablet by mouth every morning    10/14/16 03/27/17 Yes Timmothy Sours, NP   fluorouracil (ADRUCIL) 50 MG/ML injection Administer 3,477 mg into the vein every 14 days   Start in infusion center.  Infuse over 46 hours at home. 12/31/16   Hellems, Guinevere A, PA   pegfilgrastim (NEULASTA) 6 MG/0.6ML injection syringe Inject 0.6 mLs (6 mg total) into the skin once 12/28/16 01/11/17  Hellems, Guinevere A, PA   Meds Exist in Therapy Plan Peg-Filgrastim 10/24/16   Kathlen Brunswick, MD   heparin 100 UNIT/ML injection 5 mLs (500 Units total) by Intracatheter route as needed (for line care) 10/17/16   Kathlen Brunswick, MD   sodium chloride 0.9 % flush 10 mLs by Intracatheter route as needed (for line care) 10/17/16   Kathlen Brunswick, MD       Active Hospital Medications:  Scheduled Meds:   pantoprazole  40 mg Oral BID AC    piperacillin-tazobactam  4.5 g  Intravenous Q6H    methadone  10 mg Oral TID    PROSOURCE NO CARB  30 mL Oral BID WC    pregabalin  75 mg Oral 2 times per day    lidocaine  1 patch Transdermal Q24H    furosemide  10 mg Oral BID    senna  2 tablet Oral 2 times per day    miconazole   Topical 2 times per day    guaiFENesin  600 mg Oral 2 times per day    sodium chloride  2 g Per G Tube TID PC    metoclopramide  10 mg Oral TID AC    levothyroxine  150 mcg Oral Daily @ 0600    enoxaparin  40 mg Subcutaneous Daily @ 2100    linezolid  600 mg Oral 2 times per day    PROSOURCE NO CARB  30 mL Oral Daily with breakfast    polyethylene glycol  17 g Oral Daily    lactobacillus rhamnosus (GG)  1 capsule Oral Daily    bisacodyl  10 mg Rectal Daily    DULoxetine  20 mg Oral Q24H     Continuous Infusions:   sodium chloride       PRN Meds:.naloxone, HYDROmorphone PF **OR** HYDROmorphone PF, promethazine, HYDROmorphone, ondansetron, albuterol, methyl salicylate-menthol, mineral oil-hydrophilic petrolatum, sodium chloride, sodium chloride    Physical Exam  Temp:  [36 C (96.8 F)-37.5 C (99.5 F)] (P) 37.2 C (99 F)  Heart Rate:  [93-109] 109  Resp:  [12-28] 12  BP: (100-150)/(52-86) 150/86  FiO2:  [1.5 %-3 %] 1.5 %    Medical Examination:  General: NAD, resting comfortably in bed  Pulmonary: normal WOB on 1.5L NC  Extremities:  There is bilateral lower extremity edema    Neuro:  Mental  Status: Asleep, arouses easily to voice. Oriented to person, not place (states she is at home), or time (states it is October 2016). Language fluent without dysarthria. Comprehension intact, follows commands including cross-body (struggles with show 2 fingers/thumbs up with right hand, though is able to do this briskly with left hand). Naming and repetition intact. Affect flat.  Cranial Nerves:       II: Pupils 3/3 to 2/2, blinks to threat bilaterally.    III/IV/VI: Versions intact without nystagmus, R eye exotropia at rest (reportedly baseline per primary  team).    V: Facial sensation symmetric to light touch    VII: Facial expression symmetric    VIII: Hearing intact to voice    IX/X: Palate elevates symmetrically    XI: Shoulder shrug symmetric    XII: Tongue midline  Motor: Demonstrates symmetric antigravity strength with all extremities on command. Pronator drift was absent. There were intermittent nonrhythmic myoclonic jerking movements of the bilateral upper extremities, as well as negative myoclonus (asterixis) in BUE and BLE with sustained antigravity.  Sensory: Sensation to light touch was intact and symmetric throughout. No extinction on certain trials of DSS (though unreliable, at times does not identify both sides being touched, but does not reliably extinguish on one side)  Reflexes: 2+ and symmetric in BR, biceps, patellae. Bilateral plantar flexor responses. No ankle clonus  Coordination: Finger to nose intact without ataxia, but with exacerbation of the above intermittent myclonic jerks.  Gait: Deferred        Recent Labs  Lab 08/10/17  0330 08/10/17  0021   WBC  --  7.5   Hemoglobin 8.1* 8.0*   Hematocrit  --  26*   Platelets  --  304       Recent Labs  Lab 08/10/17  0021   Sodium 141   Potassium 4.1   CO2 30*   UN 13   Creatinine 0.79   Glucose 102*   Calcium 9.1       Imaging:  No results found.    Assessment: Encephalopathy in a 72 y.o. female with PMHx significant for depression, fibromyalgia, hypothyroidism and pancreatic adenocarcinoma s/p chemo and open Whipple on 03/29/2017 (has been hospitalized for 133 days). Neurologic examination is non-focal, notable only for disorientation and some lethargy, as well as intermittent non-rhythmic myoclonic jerks and asterixis. Suspect likely toxic/metabolic/infectious etiology of encephalopathy, which could also potentially cause the observed myoclonic jerks. This is considered the most likely etiology given concerns prompting sepsis alert, and her mild ammonia elevation and hypercarbia on recent labs.  Would also consider narcotic medication effect given the new urinary retention which could cause medication buildup, and the fact that she responded well to narcan. CT head is reasonable to obtain given that she was described to have an acute change in mental status that has been persistent, and there is no prior brain imaging for review, however there are no clear focal deficits to suggest acute intracranial pathology.    Plan:  - CT head w/o contrast for further evaluation given reported acute change in mental status  - further recommendations pending results of the above      Encarnacion Chu, MD    PGY-2, Neurology  08/10/2017    4:26 AM    Addendum: 9:22 AM   CT Head unremarkable.    Likely multifactorial encephalopathy as above; would continue to monitor for improvement. Hold on further neurologic testing at this time.    Seen with neurology attending, Dr. Noah Charon.  Leonel Ramsay, MD  PGY-3 Neurology  [facility] [phone]  9:23 AM, 08/10/2017

## 2017-08-11 LAB — CBC AND DIFFERENTIAL
Baso # K/uL: 0.1 10*3/uL (ref 0.0–0.1)
Basophil %: 1.2 %
Eos # K/uL: 0.5 10*3/uL — ABNORMAL HIGH (ref 0.0–0.4)
Eosinophil %: 7.7 %
Hematocrit: 24 % — ABNORMAL LOW (ref 34–45)
Hemoglobin: 7.4 g/dL — ABNORMAL LOW (ref 11.2–15.7)
IMM Granulocytes #: 0 10*3/uL
IMM Granulocytes: 0.4 %
Lymph # K/uL: 1 10*3/uL — ABNORMAL LOW (ref 1.2–3.7)
Lymphocyte %: 14.1 %
MCH: 30 pg/cell (ref 26–32)
MCHC: 30 g/dL — ABNORMAL LOW (ref 32–36)
MCV: 99 fL — ABNORMAL HIGH (ref 79–95)
Mono # K/uL: 0.7 10*3/uL (ref 0.2–0.9)
Monocyte %: 10.9 %
Neut # K/uL: 4.5 10*3/uL (ref 1.6–6.1)
Nucl RBC # K/uL: 0 10*3/uL (ref 0.0–0.0)
Nucl RBC %: 0 /100 WBC (ref 0.0–0.2)
Platelets: 288 10*3/uL (ref 160–370)
RBC: 2.5 MIL/uL — ABNORMAL LOW (ref 3.9–5.2)
RDW: 20.4 % — ABNORMAL HIGH (ref 11.7–14.4)
Seg Neut %: 65.7 %
WBC: 6.8 10*3/uL (ref 4.0–10.0)

## 2017-08-11 LAB — COMPREHENSIVE METABOLIC PANEL
ALT: 43 U/L — ABNORMAL HIGH (ref 0–35)
AST: 47 U/L — ABNORMAL HIGH (ref 0–35)
Albumin: 3 g/dL — ABNORMAL LOW (ref 3.5–5.2)
Alk Phos: 870 U/L — ABNORMAL HIGH (ref 35–105)
Anion Gap: 11 (ref 7–16)
Bilirubin,Total: 0.4 mg/dL (ref 0.0–1.2)
CO2: 30 mmol/L — ABNORMAL HIGH (ref 20–28)
Calcium: 8.8 mg/dL (ref 8.6–10.2)
Chloride: 99 mmol/L (ref 96–108)
Creatinine: 0.84 mg/dL (ref 0.51–0.95)
GFR,Black: 81 *
GFR,Caucasian: 70 *
Glucose: 93 mg/dL (ref 60–99)
Lab: 13 mg/dL (ref 6–20)
Potassium: 4.3 mmol/L (ref 3.3–5.1)
Sodium: 140 mmol/L (ref 133–145)
Total Protein: 7 g/dL (ref 6.3–7.7)

## 2017-08-11 MED ORDER — PREGABALIN 75 MG PO CAPS *I*
75.0000 mg | ORAL_CAPSULE | Freq: Once | ORAL | Status: AC
Start: 2017-08-11 — End: 2017-08-11
  Administered 2017-08-11: 75 mg via ORAL
  Filled 2017-08-11: qty 1

## 2017-08-11 MED ORDER — FUROSEMIDE 10 MG/ML PO SOLN *I*
10.0000 mg | Freq: Once | ORAL | Status: AC
Start: 2017-08-11 — End: 2017-08-11
  Administered 2017-08-11: 10 mg via ORAL
  Filled 2017-08-11: qty 1

## 2017-08-11 MED ORDER — LINEZOLID 600 MG PO TABS *I*
600.0000 mg | ORAL_TABLET | Freq: Once | ORAL | Status: AC
Start: 2017-08-11 — End: 2017-08-11
  Administered 2017-08-11: 600 mg via ORAL
  Filled 2017-08-11: qty 1

## 2017-08-11 NOTE — Progress Notes (Signed)
[facility] General Neurology Consult Attending Brief Note:  I spoke with Amanda Galloway briefly this morning. She feels clear headed. Her abdomen is bothering her.  She has some nausea and vomited earlier.  Her nurse was getting her set up to eat breakfast when I was there, but she still does not have much appetite. She was smiling and cheerful as we spoke.  At this time, her acute encephalopathy seems to have resolved.  The Neurology Consult Service will formally sign off.  Please call us back with any questions or new concerns.    Francee Piccolo, MD  Assoc. Professor of Clinical Neurology  Pager: [phone]  Office: [phone]  08/11/2017 10:24 AM

## 2017-08-11 NOTE — Consults (Signed)
SPIRITUAL ASSESSMENT NOTE    Chaplain met with pt and family for further assessment and support. Pt is [place] and she and family find comfort in prayer. Faith and family are primary supports. Pt has a consistently supportive family. Pt and family expressed a desire for follow up support as needed. Referral made to unit chaplain [facility].  Identified Religion: Christian     Spiritual Concern: Other (comment) (homesick, worn from length of stay)  Spiritual Practices: Writer, Prayer     Patient Coping: Open discussion  Family Coping: Accepting, Hopeful/ optimistic  Chaplain Interventions: Prayer, Reflective listening    Plan of Care:  Spiritual Support Team will continue to be available to ensure spiritual and emotional needs are met throughout hospitalization.     Amanda Galloway      5:02 PM on 08/11/2017

## 2017-08-11 NOTE — Progress Notes (Addendum)
Surgical Oncology Surgery Progress Note    Patient: Amanda Galloway    LOS: 135 days    Attending: [place]. AFVSS.  Resting comfortably this AM, denies pain. Denies nausea currently.  Tolerating diet, passing flatus with BM.  A&O x3. Ambulated.  Denies f/c, v, CP or SOB.     OBJECTIVE  Physical Exam:  Temp:  [36 C (96.8 F)-36.7 C (98.1 F)] 36 C (96.8 F)  Heart Rate:  [90-99] 93  Resp:  [16] 16  BP: (102-134)/(60-68) 134/65  FiO2:  [1.5 %-2 %] 2 %   GEN: no apparent distress  HEENT: NCAT, face symmetric  CHEST: nonlabored respirations on 2LNC   ABD: soft, mildly distended, tender surrounding G-tube that is CDI, minimal erythema, without active leaking   NEURO/MOTOR: alert, appropriate  EXTREMITIES: atraumatic  VASCULAR: feet wwp, 1+ peripheral edema in BLE    Intake/Output:  06/08 0700 - 06/09 0659  In: 1230 [P.O.:390]  Out: 100 [Urine:100]     Medications:  Current Facility-Administered Medications   Medication    lidocaine (LIDODERM) 5 % patch 1 patch    naloxone (NARCAN) 0.4 mg/mL injection 0.1 mg    pantoprazole (PROTONIX) EC tablet 40 mg    piperacillin-tazobactam (ZOSYN) IVPB 4.5 g    methadone (DOLOPHINE) tablet 10 mg    HYDROmorphone (DILAUDID) injection 0.5 mg    Or    HYDROmorphone (DILAUDID) injection 1 mg    PROSOURCE NO CARB liquid LIQD 30 mL    pregabalin (LYRICA) capsule 75 mg    lidocaine (LIDODERM) 5 % patch 1 patch    furosemide (LASIX) tablet 10 mg    senna (SENOKOT) tablet 2 tablet    sodium chloride 0.9 % IV    promethazine (PHENERGAN) 12.5 mg in sodium chloride 0.9% 25 mL IVPB    miconazole (MICATIN) 2 % powder    HYDROmorphone (DILAUDID) tablet 8 mg    ondansetron (ZOFRAN) injection 4 mg    guaiFENesin (MUCINEX) 12 hr tablet 600 mg    sodium chloride tablet 2 g    metoclopramide (REGLAN) tablet 10 mg    levothyroxine (SYNTHROID, LEVOTHROID) tablet 150 mcg    enoxaparin (LOVENOX) injection 40 mg    linezolid (ZYVOX) tablet 600  mg    PROSOURCE NO CARB liquid LIQD 30 mL    albuterol (PROVENTIL) nebulization 2.5 mg    polyethylene glycol (GLYCOLAX,MIRALAX) powder 17 g    lactobacillus rhamnosus (GG) (CULTURELLE) capsule 1 each    methyl salicylate-menthol (BENGAY) topical cream    bisacodyl (DULCOLAX) suppository 10 mg    DULoxetine (CYMBALTA) DR capsule 20 mg    mineral oil-hydrophilic petrolatum (AQUAPHOR) ointment    sodium chloride 0.9 % flush 10 mL    sodium chloride 0.9 % flush 10 mL     Facility-Administered Medications Ordered in Other Encounters   Medication    etomidate (AMIDATE) 2 mg/mL injection    rocuronium (ZEMURON) 10 mg/mL injection         Laboratory values:   Recent Labs      08/11/17   0020  08/10/17   0330  08/10/17   0021   08/08/17   2355  08/08/17   1647   WBC  6.8   --   7.5   < >  7.7  7.6   Hemoglobin  7.4*  8.1*  8.0*   < >  7.8*  8.0*   Hematocrit  24*   --  26*   < >  25*  26*   Platelets  288   --   304   < >  317  311   INR   --    --    --    --   1.2*  1.1    < > = values in this interval not displayed.     No components found with this basename: APTT, PT Recent Labs      08/11/17   0020  08/10/17   0021   08/08/17   2355   Sodium  140  141   < >  138   Potassium  4.3  4.1   < >  4.3   Chloride  99  99   < >  97   CO2  30*  30*   < >  29*   UN  13  13   < >  13   Creatinine  0.84  0.79   < >  0.75   Glucose  93  102*   < >  118*   Calcium  8.8  9.1   < >  9.2   Magnesium   --    --    --   1.9   Phosphorus   --    --    --   4.7*    < > = values in this interval not displayed.    Recent Labs      08/11/17   0020  08/10/17   0021   08/08/17   2355   AST  47*  57*   < >  64*   ALT  43*  56*   < >  65*   Alk Phos  870*  1,024*   < >  1,056*   Bilirubin,Total  0.4  0.5   < >  0.4   Bilirubin,Direct   --   0.3   --   0.3    < > = values in this interval not displayed.     Recent Labs      08/11/17   0020  08/10/17   0021   Total Protein  7.0  7.4   Albumin  3.0*  3.3*     No results for input(s):  AMY, LIP in the last 72 hours.   GLUCOSE:   No results for input(s): PGLU in the last 72 hours.  Imaging: Ct Head Without And With Contrast    Result Date: 08/10/2017  No CT evidence of acute intracranial abnormality. Nonspecific white matter hypoattenuation, likely chronic microvascular gliosis. END OF IMPRESSION I have personally reviewed the images and the Resident's/Fellow's interpretation and agree with or edited the findings. [facility] submits this DICOM format image data and final report to the [facility], an independent secure electronic health information exchange, on a reciprocally searchable basis (with patient authorization) for a minimum of 12 months after exam date.    Ir Feeding Tube Change    Result Date: 08/09/2017  Successful exchange of gastrostomy tube with smaller [place] size G-tube with no immediate complications. A new 16 French G-tube was placed. END OF IMPRESSION [facility] submits this DICOM format image data and final report to the [facility], an independent secure electronic health information exchange, on a reciprocally searchable basis (with patient authorization) for a minimum of 12 months after exam date.    Chest Single Frontal View    Result Date: 08/09/2017  Interval decrease  in small bilateral pleural fluid, left greater than right, with associated bilateral atelectasis. END OF IMPRESSION       ASSESSMENT  Amanda Galloway is a 72 y.o. female with h/o recent PE and pancreatic cancer POD # 102  status post whipple procedure complicated by delayed gastric emptying.  NGT replaced on 04/04/17. UGI on 04/12/17 concerning for obstruction just beyond the [place] in the efferent limb.  Course complicated on 08/26/74 by rising LFTs and sepsis with CT concerning for cholangitis given biliary tree obstruction and PV compression due to hematoma and afferent limb distention in setting of PE treatment. Is s/p IR PTC on 04/16/17 and IR percutaneous aspiration of anterior abdominal fluid collection on  04/24/17. IR LUQ perc drain placement 3/1. IR anterior abdominal 79f perc drain placement 3/3. IR anterior perc drain into left hepatic collection 3/6. Developed E. Coli bacteremia on 07/26/17 presumed secondary to aspiration pneumonia.    PLAN   Pastoral consult for possible bible teaching in hospital   Monitor mental status   Continue Zosyn and Linezolid, appreciate ID assistance  Regular diet with TF, at goal of 65 mL/12 Hr with NS Flush 30 cc every 4hr  Vent G-tube PRN nausea  C/w aggressive pulmonary toilet, OOB/ambulate, PT/OT  Bowel regimen   Analgesia with PO pain. Appreciate palliative care assistance   DVT ppx - Lovenox   Dispo: [id number]   Author: DKathrin Greathouse MD as of: 08/11/2017  at: 8:11 AM             HPB-GI Attending Addendum:   I personally examined the patient, reviewed the notes, and discussed the plan of care with the residents and patient. Agree with detailed resident's note. Please see the note above for details of history, exam, labs, assessment/plan which reflect my input.     72year old female with pancreatic cancer status post pancreaticoduodenectomy complicated by fluid collections and malnutrition, and gastrostomy tube placement with perforation.     Delirium. Resolved.     Pulmonary effusions. Stable.   Pulmonary insufficiency with need for oxygen. Continue supportive care. Will hold on tapping effusion.   Malnutrition. Encouraging oral intake. Calorie counts. Gastrostomy tube change today without difficulty.   Sepsis with EColi Bacteremia due to biliary source/cholangitis. Sensitive to Augmentin. Will need long-term suppressive antibiotics.   Hyperglycemia with intermittent need for sliding scale.   Elevated liver enzymes related to recent cholangitis.   Anemia of chronic disease. Iron supplementation and oral dietary encouragement.     EDelano Metz MD, FACS  Hepatobiliary, Pancreas & GI Surgery       I saw and evaluated the patient. I agree with the resident's/fellow's  findings and plan of care as documented above.    JVladimir Creeks MD

## 2017-08-11 NOTE — Plan of Care (Signed)
Cognitive function     Cognitive function will be maintained Maintaining     Cognitive function will be maintained Maintaining     Cognitive function will be maintained or return to baseline Maintaining        GI Bleeding Elimination     Elimination of patterns are normal or improving Maintaining        Mobility     Functional status is maintained or improved - Geriatric Maintaining     Patient's functional status is maintained or improved Maintaining        Nutrition     Patient's nutritional status is maintained or improved Maintaining     Nutritional status is maintained or improved - Geriatric Maintaining        Post-Operative Bladder Elimination     Patient is able to empty bladder or return to baseline Maintaining        Safety     Patient will remain free of falls Maintaining          Bowel Elimination     Elimination patterns are normal or improving Progressing towards goal        Fluid and Electrolyte Imbalance     Fluid and Electrolyte imbalance Progressing towards goal        Pain/Comfort     Patient's pain or discomfort is manageable Progressing towards goal     Patient's pain or discomfort is manageable Progressing towards goal        Post-Operative Bowel Elimination     Elimination pattern is normal or improving Progressing towards goal        Post-Operative Complications     Prevent post-operative complications Progressing towards goal        Post-Operative Hemodynamic Stability     Maintain Hemodynamic Stability Progressing towards goal        Psychosocial     Demonstrates ability to cope with illness Progressing towards goal

## 2017-08-12 LAB — CBC AND DIFFERENTIAL
Baso # K/uL: 0.1 10*3/uL (ref 0.0–0.1)
Basophil %: 0.8 %
Eos # K/uL: 0.5 10*3/uL — ABNORMAL HIGH (ref 0.0–0.4)
Eosinophil %: 6.4 %
Hematocrit: 26 % — ABNORMAL LOW (ref 34–45)
Hemoglobin: 7.9 g/dL — ABNORMAL LOW (ref 11.2–15.7)
IMM Granulocytes #: 0.1 10*3/uL
IMM Granulocytes: 0.7 %
Lymph # K/uL: 1 10*3/uL — ABNORMAL LOW (ref 1.2–3.7)
Lymphocyte %: 12.1 %
MCH: 31 pg/cell (ref 26–32)
MCHC: 31 g/dL — ABNORMAL LOW (ref 32–36)
MCV: 100 fL — ABNORMAL HIGH (ref 79–95)
Mono # K/uL: 0.7 10*3/uL (ref 0.2–0.9)
Monocyte %: 8.9 %
Neut # K/uL: 5.9 10*3/uL (ref 1.6–6.1)
Nucl RBC # K/uL: 0 10*3/uL (ref 0.0–0.0)
Nucl RBC %: 0 /100 WBC (ref 0.0–0.2)
Platelets: 279 10*3/uL (ref 160–370)
RBC: 2.6 MIL/uL — ABNORMAL LOW (ref 3.9–5.2)
RDW: 20.7 % — ABNORMAL HIGH (ref 11.7–14.4)
Seg Neut %: 71.1 %
WBC: 8.3 10*3/uL (ref 4.0–10.0)

## 2017-08-12 LAB — COMPREHENSIVE METABOLIC PANEL
ALT: 42 U/L — ABNORMAL HIGH (ref 0–35)
AST: 52 U/L — ABNORMAL HIGH (ref 0–35)
Albumin: 3 g/dL — ABNORMAL LOW (ref 3.5–5.2)
Alk Phos: 853 U/L — ABNORMAL HIGH (ref 35–105)
Anion Gap: 10 (ref 7–16)
Bilirubin,Total: 0.4 mg/dL (ref 0.0–1.2)
CO2: 30 mmol/L — ABNORMAL HIGH (ref 20–28)
Calcium: 8.9 mg/dL (ref 8.6–10.2)
Chloride: 100 mmol/L (ref 96–108)
Creatinine: 0.88 mg/dL (ref 0.51–0.95)
GFR,Black: 76 *
GFR,Caucasian: 66 *
Glucose: 118 mg/dL — ABNORMAL HIGH (ref 60–99)
Lab: 16 mg/dL (ref 6–20)
Potassium: 4.1 mmol/L (ref 3.3–5.1)
Sodium: 140 mmol/L (ref 133–145)
Total Protein: 7 g/dL (ref 6.3–7.7)

## 2017-08-12 LAB — EKG 12-LEAD
P: 35 deg
P: 54 deg
PR: 168 ms
PR: 172 ms
QRS: 30 deg
QRS: 32 deg
QRSD: 66 ms
QRSD: 68 ms
QT: 336 ms
QT: 356 ms
QTc: 438 ms
QTc: 434 ms
Rate: 102 {beats}/min
Rate: 89 {beats}/min
T: 50 deg
T: 34 deg

## 2017-08-12 LAB — PREALBUMIN: Prealbumin: 11 mg/dL — ABNORMAL LOW (ref 20–40)

## 2017-08-12 LAB — PROTIME-INR
INR: 1.2 — ABNORMAL HIGH (ref 0.9–1.1)
Protime: 13.3 s — ABNORMAL HIGH (ref 10.0–12.9)

## 2017-08-12 LAB — APTT: aPTT: 35.2 s (ref 25.8–37.9)

## 2017-08-12 LAB — MAGNESIUM: Magnesium: 1.8 mg/dL (ref 1.6–2.5)

## 2017-08-12 LAB — CRP: CRP: 46 mg/L — ABNORMAL HIGH (ref 0–10)

## 2017-08-12 LAB — SEDIMENTATION RATE, AUTOMATED: Sedimentation Rate: 96 mm/hr — ABNORMAL HIGH (ref 0–30)

## 2017-08-12 LAB — PHOSPHORUS: Phosphorus: 4.5 mg/dL (ref 2.7–4.5)

## 2017-08-12 LAB — TRIGLYCERIDES: Triglycerides: 190 mg/dL — AB

## 2017-08-12 MED ORDER — HYDROMORPHONE HCL 2 MG/ML IJ SOLN *WRAPPED*
1.0000 mg | INTRAMUSCULAR | Status: AC | PRN
Start: 2017-08-12 — End: 2017-08-16
  Administered 2017-08-12 – 2017-08-15 (×3): 1 mg via INTRAVENOUS
  Filled 2017-08-12 (×3): qty 1

## 2017-08-12 MED ORDER — HYDROMORPHONE HCL 4 MG PO TABS *I*
8.0000 mg | ORAL_TABLET | ORAL | Status: AC | PRN
Start: 2017-08-12 — End: 2017-08-16
  Administered 2017-08-12 – 2017-08-15 (×18): 8 mg via ORAL
  Filled 2017-08-12 (×17): qty 2

## 2017-08-12 MED ORDER — HYDROMORPHONE HCL 2 MG/ML IJ SOLN *WRAPPED*
0.5000 mg | INTRAMUSCULAR | Status: AC | PRN
Start: 2017-08-12 — End: 2017-08-16
  Administered 2017-08-14 (×2): 0.5 mg via INTRAVENOUS
  Filled 2017-08-12 (×2): qty 1

## 2017-08-12 NOTE — Plan of Care (Signed)
Problem: Safety  Goal: Patient will remain free of falls  Outcome: Maintaining      Problem: Pain/Comfort  Goal: Patient's pain or discomfort is manageable  Outcome: Progressing towards goal      Problem: Mobility  Goal: Functional status is maintained or improved - Geriatric  Outcome: Progressing towards goal      Problem: Nutrition  Goal: Patient's nutritional status is maintained or improved  Outcome: Maintaining      Problem: Cognitive function  Goal: Cognitive function will be maintained  Outcome: Maintaining    Goal: Cognitive function will be maintained  Outcome: Maintaining    Goal: Cognitive function will be maintained or return to baseline  Outcome: Maintaining      Problem: Bowel Elimination  Goal: Elimination patterns are normal or improving  Outcome: Progressing towards goal      Problem: Post-Operative Hemodynamic Stability  Goal: Maintain Hemodynamic Stability  Outcome: Maintaining      Problem: Post-Operative Complications  Goal: Prevent post-operative complications  Outcome: Maintaining      Problem: Post-Operative Bowel Elimination  Goal: Elimination pattern is normal or improving  Outcome: Progressing towards goal      Problem: Psychosocial  Goal: Demonstrates ability to cope with illness  Outcome: Progressing towards goal      Problem: Fluid and Electrolyte Imbalance  Goal: Fluid and Electrolyte imbalance  Outcome: Maintaining      Problem: GI Bleeding Elimination  Goal: Elimination of patterns are normal or improving  Outcome: Maintaining

## 2017-08-12 NOTE — Progress Notes (Signed)
SPIRITUAL ASSESSMENT NOTE    Unit chaplain visited upon referral from on-call chaplain.  This is also a follow-up visit.  Patient expressed being fearful of future progression, though not specifically afraid of death.  Reflected upon her faith and family support. Closed in prayer.       Identified Religion: Christian     Spiritual Concern: Fearful  Spiritual Practices: Prayer, Sacred text     Patient Coping: Open discussion, Fearful  Family Coping: Accepting, Hopeful/ optimistic  Chaplain Interventions: Prayer, Encourage patient to use religious faith as resource, Normalized feelings, Reflective listening    Plan of Care:  Unit chaplain to visit periodically during patient's inpatient stay for additional support.  Patient informed chaplains also available on call.    Shirlean Kelly      12:26 PM on 08/12/2017

## 2017-08-12 NOTE — Consults (Signed)
Medical Nutrition Therapy Brief Note:    TFs restarted 6/10. G-tube to be vented prn for nausea. Has altered mental status s/p G-tube exchange 6/7. Labs reviewed, lytes WNL. Current TFs of TwoCal HN @ 65 ml/hr x 12 hours with 30 ml NS flush Q4H. Provides 1560 kcal, 65 g protein and 726 ml free water daily. Cal counts re-ordered 6/10. Pt on Ensure compact QID and 30 ml Prosource TID. Met with pt and daughter, they report she is eating much better but is feeling too full from TFs in the morning for breakfast. She is drinking and enjoying the Ensure compact, and finds it less overwhelming than the 8oz Ensure. She drank an additional Atkins shake earlier today. Explained to pt and daughter that we ordered repeat calorie counts to assess for adequacy of intake and we can further cut back on TFs. Encouraged pt to eat as much as she can and take supplements to get her off TFs. Please see RD student note 6/7 for full assessment with calorie counts, pt meeting 56% of kcal and 51% of protein needs. Can modify TFs to meet 50% of needs.     Estimated Nutrient Needs:   (Based on low weight of admission, 64.9 kg; close to being a dry weight)  1625-1950kcal/day (25-30kcal/kg)  98-130g protein/day (1.3-1.8g/kg)               1625-1950 mL/day (25-30 mL/kg)     Recommend:   1.  Recommend TwoCal HN @ 50 ml/hr x 8 hours overnight with FWF 30 ml NS Q6H. Will provide 800 kcal, 33 g protein and 460 ml free water daily. Adjust FWF prn based on PO fluid intake and fluid status   2.  Continue Ensure compact and magic cup as ordered  3.  Continue current lab and weight monitoring parameters   4.  Writer will f/u with calorie counts     Laurence Aly, [place]  Pager [phone]

## 2017-08-12 NOTE — Progress Notes (Signed)
Infectious Diseases Follow Up Note    Personally reviewed chart, labs, medications. Patient seen.     CC:  F/U s/p VRE bacteremia, liver and abdominal abscesses (VRE, enterobacter, C. glabrata and C. albicans, s/p Enterobacter cloacae bacteremia, and most recently E.coli bacteremia, Asp PNA.    Interval History:   On Friday 6/7 she had an episode of desaturation to 85% despite )2 to 6L nc and acute mental status changes with profound confusion soon after IR procedure to downside PEG, mildly tachycardic with rate 101, Temp 37.4.  No sedation was given during IR procedure. Sepsis alert triggered. The episode cleared quickly once patient returned to the floor with team reporting good O2 sats and aware of place and procedure just returned from.  Lactate 1.7, Blood Cultures periphery and PICC drawn (remain NGTD), U/A negative, bolus of 500cc IVF, and po Augmentin d/c in favor of Pip/Tazo 4.5 G IV Q6hr and she remained on po Linezolid BID as well.  CXR showed improvement in sm Pl effusions. She received less pain medication than usual because of episode.  She was twitching excessively after episode.  Later that evening she became obtunded, not responding to sternal rub, worsening myoclonic tremor.  She did have some improvement with Narcan (repeated later during the night). Labs WNL.  Neuro consult requested, they felt likely patient experienced toxic-metabolic encephalopathy. Head CT unrevealing.  Over the next couple days her mental status continued to improve. Encephalopathy now resolved.      Subjective:  Visitors in room including daughter, Gwenette is smiling and conversing with them.  She denies F/C, N/V/D, states she moved her bowels but cannot recall if today or yesterday.  Still c/o PEG tube site pain. Poor appetite. No rashes. Denies SOB.  Her daughter feels pt's LE edema is decreased.     ROS: relevant ROS as above.     Current Meds:  Scheduled Meds:   lidocaine  1 patch Transdermal Q24H    pantoprazole  40 mg  Oral BID AC    piperacillin-tazobactam  4.5 g Intravenous Q6H    methadone  10 mg Oral TID    PROSOURCE NO CARB  30 mL Oral BID WC    pregabalin  75 mg Oral 2 times per day    lidocaine  1 patch Transdermal Q24H    furosemide  10 mg Oral BID    senna  2 tablet Oral 2 times per day    miconazole   Topical 2 times per day    guaiFENesin  600 mg Oral 2 times per day    sodium chloride  2 g Per G Tube TID PC    metoclopramide  10 mg Oral TID AC    levothyroxine  150 mcg Oral Daily @ 0600    enoxaparin  40 mg Subcutaneous Daily @ 2100    PROSOURCE NO CARB  30 mL Oral Daily with breakfast    polyethylene glycol  17 g Oral Daily    lactobacillus rhamnosus (GG)  1 capsule Oral Daily    bisacodyl  10 mg Rectal Daily    DULoxetine  20 mg Oral Q24H     Continuous Infusions:   sodium chloride 3 mL/hr (08/11/17 1544)     PRN Meds:.   calcium carbonate  1,000 mg Oral BID PRN    HYDROmorphone PF  0.5 mg Intravenous Q1H PRN    Or    HYDROmorphone PF  1 mg Intravenous Q1H PRN    LORazepam  0.5 mg  Oral Q6H PRN    heparin lock flush  50 Units Intracatheter PRN    ondansetron  4 mg Intravenous Q6H PRN     Objective:  BP: (102-124)/(58-72)   Temp:  [35.7 C (96.3 F)-37.1 C (98.8 F)]   Temp src: Temporal (06/10 1148)  Heart Rate:  [87-100]   Resp:  [15-16]   SpO2:  [92 %-97 %]     Physical Exam:              General Appearance: sitting in chair, smiling and appropriately engaging during most of my visit. NAD.    HEENT: MMM, clear oropharynx   Pulm: very few find crackles left base, otherwise moving air bilaterally much better than during previous visits.  WOB unlabored on RA  CV: RRR, no M/R/G, decreased LE edema, compression stockings on  Extremities: WWP  Abdomen:  mildly distended and tympanic, TTP near PEG, normoactive bowel sounds. PEG with mildly erythematous skin bordering insertion site, no drainage.     Skin: warm/dry, no rashes    Neuro: Alert, oriented to place, person, appropriate in  conversation  Lines: PICC LUE (4/11) site benign.       Recent Labs  Lab 08/12/17  0025 08/11/17  0020 08/10/17  0330 08/10/17  0021   WBC 8.3 6.8  --  7.5   Hemoglobin 7.9* 7.4* 8.1* 8.0*   Hematocrit 26* 24*  --  26*   Platelets 279 288  --  304   Seg Neut % 71.1 65.7  --  64.1   Lymphocyte % 12.1 14.1  --  17.2   Monocyte % 8.9 10.9  --  11.9   Eosinophil % 6.4 7.7  --  5.5       Recent Labs  Lab 08/12/17  0025 08/11/17  0020 08/10/17  0021   Sodium 140 140 141   Potassium 4.1 4.3 4.1   CO2 30* 30* 30*   UN '16 13 13   ' Creatinine 0.88 0.84 0.79   Glucose 118* 93 102*   Calcium 8.9 8.8 9.1         Lab results: 08/12/17  0025 08/11/17  0020 08/10/17  0021 08/09/17  2234 08/09/17  1459   Total Protein 7.0 7.0 7.4 7.3 7.5   Albumin 3.0* 3.0* 3.3* 3.3* 3.2*   ALT 42* 43* 56* 63* 64*   AST 52* 47* 57* 59* 59*   Alk Phos 853* 870* 1,024* 1,020* 1,023*   Bilirubin,Total 0.4 0.4 0.5 0.5 0.5       Lab Results  Component Value Date/Time   CRP 46 (H) 08/12/2017 0025   CRP 48 (H) 08/05/2017 0045   CRP 63 (H) 07/29/2017 0019       Lab Results  Component Value Date/Time   Sedimentation Rate 96 (H) 08/12/2017 0025   Sedimentation Rate 101 (H) 08/05/2017 0045   Sedimentation Rate 39 (H) 07/29/2017 0019       Micro:   1/25: Intraoperative bile: GS 0 pmns, no organisms, Cx: no growth  2/12: 1 set of blood cultures from the right Mediport (no peripheral bld was sent): positive for Enterobacter cloacae complex in 70 hrs, sensitive to zosyn.   2/18 abdominal GS had >25 PMNs, many GPC in pairs/chains, many GNB and cultures 4+ Enterobacter cloacae complex. The cultures also grew 1+ Candida glabrata   2/21: BCx from the Right peripheral IV, Mediport, L IJ:  No growth  05/03/17: IR-guided I&D with drain placement of the left upper quadrant  collection: GS > 25 PMNs, many GNB and GN diplococci, Cultures with 4+ Enterobacter cloacae complex (resistant to Zosyn and CTX), C. Albicans and C. Glabrata, and Candida tropicalis  05/04/17:    1 set of  blood cultures from left IJ: VRE at 15.3 TTP. Daptomycin sensitivities: MIC 57mg/ml (dose dependent),  Linezolid   1 set from periphery: VRE and Enterobacter cloacae at 14.8 TTP   05/05/17:  IR-guided I&D with drain placement of the intrahepatic collection: GS 1-10 PMNs, very few GPC in pairs. Fungal cultures with Candida glabrata  05/05/17: Blood cultures from periphery, Mediport and IJ: no growth  05/04/17: Urine cultures obtained (for unclear reason): no growth  05/08/17: Abscess GS: >25 PMNs, many GPC in chains, few GNB. Aerobic cultures growing 2+ Enterobacter (ertapenem, Cipro, TMP/SMX-sensitive) and 3+VRE, Fungal: C. Glabrata (sens to caspo, vori and dose dependent sens to fluconazole)  06/06/17: 2 sets of BCx (1 set each from periphery and Mediport) - taken after restarting ertapenem: IVAD grew VRE ttp 18.2 hours,  Periphery (Left arm) grew VRE ttp 17.3.  06/07/17 Pleural fluid (left) GS 1-10 PMN's, 1-10 Nucleated white cells, No organisms seen and 1604 nucleated cells. No growth on culture.   06/07/17 Abdominal abscess: labs cancelled - no specimens received.   06/08/17: Mediport BC + for VRE in 23 hrs (R-Amp, R- tetracycline per lab, suppressed result, R- doxycycline;  S-Linezolid, S-Dapto dose dependent),  Right hand peripheral cx: No growth   06/10/17: Blood culture 2/2: 1 periphery (RHand), 1 from Mediport before removed: no growth  06/11/17: Blood Culture 2/2 (both periphery) - no growth   06/19/17:  Perigastric fluid #1:  GS 10-25 PMNs -  GPB, GNB, GPC in pairs. 1+ C. Glabrata (R- fluconazole, voriconazole, S- Caspofungin); 2+ Enterococcus faecium (R- amp, PenG, Vanco; S- Linezolid)  06/19/17:  Liver fluid collection:  GS: 0 PMNs, Fungal and AFB stains negative, 1+ enterococcus faecium (R- Amp, PenG, Vanco, S- Dapto dose dependent, Linezolid)  06/19/17 : Perigastric fluid #2: GS: >25 PMNs, Gram negative bacilli; 3+ C. Glabrata.   07/04/17 U/A negative  07/04/17 Blood Cultures 3/3 (1 set PICC proximal port, 2 sets periphery):  NGTD  07/04/17 MRSA amplification Nares: negative  07/05/17 Blood Culture 3/3 (2 PICC, 1 Periph) - NGTD  07/05/17 Liver Abscess (IR drain)- <1PMNs, GPC in prs and chains, 1 colony candida albicans (R- fluconazole, S-Caspo, vori)), 4+ Enterococcus faecium - two isolates with one now resistant to Daptomycin, the other dose dependent.   5/24: BCx 2 sets 1 peripheral 1 PICC. Peripheral grew E.coli after 13 hrs (Pansensitive).  5/24: UA no WBCS  5/26: BCx 2 sets 1 peripheral 1 PICC - no growth  5/27 Sputum Gram Stain >10 squamous epithealial cells <25 PMNs - not cultured/contaminated  5/29: BCx 2 sets (1 set periph, 1 set PICC): no growth  5/29 Sputum - not collected   5/29 U/A negative   5/29 Stool negative for C.diff  5/31 throat culture negative for StrepA  6/7 Urine: U/A neg  6/7 Blood cultures 2 sets (1 perph/1 PICC) NGTD      Imaging/Other Relevant Diagnostics:     6/7 CXR: Interval decrease in small bilateral pleural fluid, left greater than right, with associated bilateral atelectasis.    6/7 Head CT with and without contrast:    IMPRESSION:  No CT evidence of acute intracranial abnormality. Nonspecific white matter hypoattenuation, likely chronic microvascular gliosis.    Assessment and Plan:  ID problem(s):   - E.coli bacteremia/aspiration pneumonia (5/24).  -  s/p VRE Bacteremia (3/2, 4/4, 4/6)   - s/p enterobacter bacteremia (2/12, 3/2)  - s/p Multiple Intra-abdominal abscesses growing VRE, enterobacter, C. glabrata, C.albicans    72 y.o. female with h/o recent PE and pancreatic cancer, s/p whipple procedure (goal of cure), prolonged complicated hospital course including multiple sepsis episodes, malnutrition 2/2 poor po intake requiring TPN to PEG, multiple intraabdominal and hepatic abscesses and drains - abscesses have grown VRE, Enterobacter, Candida glabrata and albicans. Last of many abdominal drains pulled on 07/22/17.  She had Enterobacter bacteremia on 2/12 and 3/2 and VRE bacteremia on 3/2, 4/4, 4/6  (believed in April to be from a seeded mediport which was removed).  She was Linezolid for the VRE bacteremia since 5/2 until 6/10, (Daptomycin resistance in one isolate of VRE). On 5/24 developed E.coli bactermia possibly related to aspiration PNA, treated with carbapenems, then narrowed to cefazolin and po flagyl on 5/28, however she developed fever and low grade leukocyctosis after 2 days on Cefazolin/Flagy thus was broadened again to Pip/Tazo and Linezolid kept on board. She improved on the Pip/Tazo x 7 days, and was switched to Augmentin on 08/26/17 with plan to complete a 2 week course antibiotics for E.coli bacteremia, to finish on 6/7.  However, she had an episode of profound confusion, O2 desaturation, and decreased LOC on 6/7 s/p IR procedure to downsize PEG tube. Her Pip/Tazo was restarted on 6/7 (see interval history above).  Neuro was consulted and determined patient likely experienced acute toxic-metabolic encephalopathy, which has now resolved.   She is HDS, her white count remains WNL, and she is afebrile.  Blood cultures from 6/7 NGTD.   Discussion today with ID attending Dr. William Hamburger regarding discontinuing one or both of current antibiotics but given her history will do this in a stepwise fashion. I paged covering provider A. Haugh PA regarding d/c Zosyn, she relayed that per Dr. Ron Agee, primary team elected to discontinue Linezolid first and is planning on discontinuing Zosyn after tomorrows doses. We are in agreement, without culture or imaging data indicating a source, and no s/s of infection, we would like to see how she does off antibiotics.        Recommendations:    Linezolid was discontinued today (per primary team)   Continue Zosyn through tomorrow, then discontinue   Follow fever curve, white count   ID team 3 will continue to follow along      Discussed case/plan with ID attending: Dr. Lynita Lombard    Thank you for asking the ID Service to participate in your patient's care.  Please update  [name] with any change in clinical status.     Lonzo Candy, ANP-BC  Infectious Diseases, Team 3  Pager [phone]  Office Ph [phone]

## 2017-08-12 NOTE — Progress Notes (Addendum)
Surgical Oncology Surgery Progress Note    Patient: Amanda Galloway    LOS: 136 days    Attending: Lynden. AFVSS. Has been nauseous since yesterday late afternoon, denies emesis.  Ambulating and voiding without issue. Denies f/c, CP or SOB. Endorses pain around G-tube site. +f, +bm. A &O x3 this am, appropriately interactive. Seen by chaplain yesterday per request.      OBJECTIVE  Physical Exam:  Temp:  [35.7 C (96.3 F)-36.3 C (97.3 F)] 36.1 C (97 F)  Heart Rate:  [85-100] 100  Resp:  [15-16] 15  BP: (110-135)/(60-72) 115/72   GEN: no apparent distress  HEENT: NCAT, face symmetric  CHEST: nonlabored respirations on 2LNC   ABD: soft, mildly distended, tender surrounding G-tube that is CDI, minimal erythema, without active leaking   NEURO/MOTOR: alert, appropriate  EXTREMITIES: atraumatic  VASCULAR: feet wwp, 1+ peripheral edema in BLE    Intake/Output:  06/09 0700 - 06/10 0659  In: 1609.7 [P.O.:570]  Out: 1350 [Urine:1350]     Medications:  Current Facility-Administered Medications   Medication    HYDROmorphone (DILAUDID) tablet 8 mg    HYDROmorphone (DILAUDID) injection 0.5 mg    Or    HYDROmorphone (DILAUDID) injection 1 mg    lidocaine (LIDODERM) 5 % patch 1 patch    naloxone (NARCAN) 0.4 mg/mL injection 0.1 mg    pantoprazole (PROTONIX) EC tablet 40 mg    piperacillin-tazobactam (ZOSYN) IVPB 4.5 g    methadone (DOLOPHINE) tablet 10 mg    PROSOURCE NO CARB liquid LIQD 30 mL    pregabalin (LYRICA) capsule 75 mg    lidocaine (LIDODERM) 5 % patch 1 patch    furosemide (LASIX) tablet 10 mg    senna (SENOKOT) tablet 2 tablet    sodium chloride 0.9 % IV    promethazine (PHENERGAN) 12.5 mg in sodium chloride 0.9% 25 mL IVPB    miconazole (MICATIN) 2 % powder    ondansetron (ZOFRAN) injection 4 mg    guaiFENesin (MUCINEX) 12 hr tablet 600 mg    sodium chloride tablet 2 g    metoclopramide (REGLAN) tablet 10 mg    levothyroxine (SYNTHROID, LEVOTHROID)  tablet 150 mcg    enoxaparin (LOVENOX) injection 40 mg    linezolid (ZYVOX) tablet 600 mg    PROSOURCE NO CARB liquid LIQD 30 mL    albuterol (PROVENTIL) nebulization 2.5 mg    polyethylene glycol (GLYCOLAX,MIRALAX) powder 17 g    lactobacillus rhamnosus (GG) (CULTURELLE) capsule 1 each    methyl salicylate-menthol (BENGAY) topical cream    bisacodyl (DULCOLAX) suppository 10 mg    DULoxetine (CYMBALTA) DR capsule 20 mg    mineral oil-hydrophilic petrolatum (AQUAPHOR) ointment    sodium chloride 0.9 % flush 10 mL    sodium chloride 0.9 % flush 10 mL     Facility-Administered Medications Ordered in Other Encounters   Medication    etomidate (AMIDATE) 2 mg/mL injection    rocuronium (ZEMURON) 10 mg/mL injection         Laboratory values:   Recent Labs      08/12/17   0025  08/11/17   0020   WBC  8.3  6.8   Hemoglobin  7.9*  7.4*   Hematocrit  26*  24*   Platelets  279  288   INR  1.2*   --    Sedimentation Rate  96*   --    CRP  46*   --  No components found with this basename: APTT, PT Recent Labs      08/12/17   0025  08/11/17   0020   Sodium  140  140   Potassium  4.1  4.3   Chloride  100  99   CO2  30*  30*   UN  16  13   Creatinine  0.88  0.84   Glucose  118*  93   Calcium  8.9  8.8   Magnesium  1.8   --    Phosphorus  4.5   --     Recent Labs      08/12/17   0025  08/11/17   0020  08/10/17   0021   AST  52*  47*  57*   ALT  42*  43*  56*   Alk Phos  853*  870*  1,024*   Bilirubin,Total  0.4  0.4  0.5   Bilirubin,Direct   --    --   0.3     Recent Labs      08/12/17   0025  08/11/17   0020   Total Protein  7.0  7.0   Albumin  3.0*  3.0*   Prealbumin  11*   --      No results for input(s): AMY, LIP in the last 72 hours.   GLUCOSE:   No results for input(s): PGLU in the last 72 hours.  Imaging: Ct Head Without And With Contrast    Result Date: 08/10/2017  No CT evidence of acute intracranial abnormality. Nonspecific white matter hypoattenuation, likely chronic microvascular gliosis. END OF  IMPRESSION I have personally reviewed the images and the Resident's/Fellow's interpretation and agree with or edited the findings. [facility] submits this DICOM format image data and final report to the [facility], an independent secure electronic health information exchange, on a reciprocally searchable basis (with patient authorization) for a minimum of 12 months after exam date.    Ir Feeding Tube Change    Result Date: 08/09/2017  Successful exchange of gastrostomy tube with smaller [place] size G-tube with no immediate complications. A new 16 French G-tube was placed. END OF IMPRESSION [facility] submits this DICOM format image data and final report to the [facility], an independent secure electronic health information exchange, on a reciprocally searchable basis (with patient authorization) for a minimum of 12 months after exam date.    Chest Single Frontal View    Result Date: 08/09/2017  Interval decrease in small bilateral pleural fluid, left greater than right, with associated bilateral atelectasis. END OF IMPRESSION       ASSESSMENT  Amanda Galloway is a 72 y.o. female with h/o recent PE and pancreatic cancer POD # 102  status post whipple procedure complicated by delayed gastric emptying.  NGT replaced on 04/04/17. UGI on 04/12/17 concerning for obstruction just beyond the [place] in the efferent limb.  Course complicated on 3/32/95 by rising LFTs and sepsis with CT concerning for cholangitis given biliary tree obstruction and PV compression due to hematoma and afferent limb distention in setting of PE treatment. Is s/p IR PTC on 04/16/17 and IR percutaneous aspiration of anterior abdominal fluid collection on 04/24/17. IR LUQ perc drain placement 3/1. IR anterior abdominal 40f perc drain placement 3/3. IR anterior perc drain into left hepatic collection 3/6. Developed E. Coli bacteremia on 07/26/17 presumed secondary to aspiration pneumonia.    PLAN   Monitor mental status   Continue Zosyn and Linezolid,  appreciate ID assistance  Regular diet with TF, at goal of 65 mL/12 Hr with NS Flush 30 cc every 4hr  Vent G-tube PRN nausea  C/w aggressive pulmonary toilet, OOB/ambulate, PT/OT  Bowel regimen   Analgesia with PO pain. Appreciate palliative care assistance   DVT ppx - Lovenox   Dispo: [id number]    Author: Melburn Hake, MD as of: 08/12/2017  at: 6:02 AM       [facility]B-GI Attending Addendum:   I personally examined the patient, reviewed the notes, and discussed the plan of care with the residents and patient. Agree with detailed resident's note. Please see the note above for details of history, exam, labs, assessment/plan which reflect my input.     72 year old female with pancreatic cancer status post pancreaticoduodenectomy    Events of weekend noted. Hallucinations, atonia   Possible overdose of narcotics with methadone and dilaudid.   Alert and awake today. Dosing schedule changed.   Malnutrition. Encouraging oral intake.   Gastrostomy tube placement with pain. Tolerating tube feedings.   Physical debility.     Delano Metz, MD, FACS  Hepatobiliary, Pancreas & GI Surgery

## 2017-08-12 NOTE — Progress Notes (Signed)
SW continues to follow pt for discharge planning. She is working with PT and showing improvement in her ambulation. Pt still on IV antibiotics  , will clarify with team as to how much longer she will require them. Pt remains medicaid pending at this time. SW spoke with daughter , no concerns noted re: discharge planning wise. Daughter requesting two 30 day parking passes which where provided to her. Will continue to assist as needed.

## 2017-08-13 LAB — COMPREHENSIVE METABOLIC PANEL
ALT: 40 U/L — ABNORMAL HIGH (ref 0–35)
AST: 54 U/L — ABNORMAL HIGH (ref 0–35)
Albumin: 3.4 g/dL — ABNORMAL LOW (ref 3.5–5.2)
Alk Phos: 851 U/L — ABNORMAL HIGH (ref 35–105)
Anion Gap: 17 — ABNORMAL HIGH (ref 7–16)
Bilirubin,Total: 0.4 mg/dL (ref 0.0–1.2)
CO2: 25 mmol/L (ref 20–28)
Calcium: 8.9 mg/dL (ref 8.6–10.2)
Chloride: 99 mmol/L (ref 96–108)
Creatinine: 0.84 mg/dL (ref 0.51–0.95)
GFR,Black: 81 *
GFR,Caucasian: 70 *
Glucose: 135 mg/dL — ABNORMAL HIGH (ref 60–99)
Lab: 17 mg/dL (ref 6–20)
Potassium: 4.3 mmol/L (ref 3.3–5.1)
Sodium: 141 mmol/L (ref 133–145)
Total Protein: 7.7 g/dL (ref 6.3–7.7)

## 2017-08-13 LAB — CBC AND DIFFERENTIAL
Baso # K/uL: 0.1 10*3/uL (ref 0.0–0.1)
Basophil %: 0.7 %
Eos # K/uL: 0.5 10*3/uL — ABNORMAL HIGH (ref 0.0–0.4)
Eosinophil %: 5.5 %
Hematocrit: 29 % — ABNORMAL LOW (ref 34–45)
Hemoglobin: 8.8 g/dL — ABNORMAL LOW (ref 11.2–15.7)
IMM Granulocytes #: 0.1 10*3/uL
IMM Granulocytes: 0.5 %
Lymph # K/uL: 1.4 10*3/uL (ref 1.2–3.7)
Lymphocyte %: 14.4 %
MCH: 30 pg/cell (ref 26–32)
MCHC: 31 g/dL — ABNORMAL LOW (ref 32–36)
MCV: 98 fL — ABNORMAL HIGH (ref 79–95)
Mono # K/uL: 0.8 10*3/uL (ref 0.2–0.9)
Monocyte %: 8.6 %
Neut # K/uL: 6.8 10*3/uL — ABNORMAL HIGH (ref 1.6–6.1)
Nucl RBC # K/uL: 0 10*3/uL (ref 0.0–0.0)
Nucl RBC %: 0 /100 WBC (ref 0.0–0.2)
Platelets: 335 10*3/uL (ref 160–370)
RBC: 2.9 MIL/uL — ABNORMAL LOW (ref 3.9–5.2)
RDW: 20.8 % — ABNORMAL HIGH (ref 11.7–14.4)
Seg Neut %: 70.3 %
WBC: 9.7 10*3/uL (ref 4.0–10.0)

## 2017-08-13 MED ORDER — DEXTROSE 5 % FLUSH FOR PUMPS *I*
0.0000 mL/h | INTRAVENOUS | Status: DC | PRN
Start: 2017-08-13 — End: 2017-08-24

## 2017-08-13 MED ORDER — SODIUM CHLORIDE 0.9 % FLUSH FOR PUMPS *I*
0.0000 mL/h | INTRAVENOUS | Status: DC | PRN
Start: 2017-08-13 — End: 2017-08-24
  Administered 2017-08-13 (×2): 10 mL/h via INTRAVENOUS
  Administered 2017-08-13 (×8): 10 mL/h
  Administered 2017-08-13: 10 mL/h via INTRAVENOUS
  Administered 2017-08-14 (×4): 10 mL/h
  Administered 2017-08-15: 20 mL/h
  Administered 2017-08-15: 20 mL/h via INTRAVENOUS
  Administered 2017-08-15 (×2): 20 mL/h
  Administered 2017-08-18: 5 mL/h
  Administered 2017-08-18 – 2017-08-20 (×2): 5 mL/h via INTRAVENOUS
  Administered 2017-08-20 (×2): 5 mL/h

## 2017-08-13 MED ORDER — SODIUM CHLORIDE 1 GM PO TABS *I*
1.0000 g | ORAL_TABLET | Freq: Three times a day (TID) | ORAL | Status: DC
Start: 2017-08-13 — End: 2017-08-24
  Administered 2017-08-13 – 2017-08-24 (×32): 1 g via GASTROSTOMY
  Filled 2017-08-13 (×39): qty 1

## 2017-08-13 NOTE — Progress Notes (Signed)
Palliative Care Progress Note    HPI: [name] w/ pancreatic CA s/p Whipple 1/25 (treatment for cure) w/ prolonged complicated hospitalization issues w/ infection, nutrition/appetite/nausea/constipation, s/p PEG w/ perf now resolved. Pt resumed methadone on 5/8 and weaning off her dilaudid PCA 5/23, most recent methadone increase on 6/6. All abd drains now removed, still receiving TFs at night to optimize appetite during day. Pt w/ recurrent sepsis alerts ~ every 4 days, most recently 6/7 when having PEG tube changed out & issues w/ encephalopathy over WE which improved w/ low dose narcan (0.1 mg on 6/8). Pt now finishing IV abx (6/11) and goal all PO meds in anticipation of dispo to SNF rehab.    Subjective: "I'm feeling better, I walked 2 times around the unit, the breakfast was pretty good."    Objective: chart reviewed, pt last used dilaudid IV at 3 pm yesterday but otherwise had a good day per RN walked 3x and appetite was better, last day of IV abx, pt willing to drink Ensure for protein and felt bengay cream helped stiff joints, overall in good spirits, awaiting dispo    Palliative Care ROS: abd pain by PEG tube 6/10 -improves w/ po dilaudid & worse when OOB walking  Pain   Moderate  Anorexia   Moderate  Shortness of Breath   Mild  Tiredness/Fatigue   Mild  Drowsiness/Sleepiness   Mild  Constipation   no  Unable to Respond   no  Delirium   no   Last stool 6/11 x 6    Physical Examination:   BP: (104-128)/(50-66)   Temp:  [35.6 C (96.1 F)-36.9 C (98.4 F)]   Temp src: Temporal (06/11 1600)  Heart Rate:  [93-100]   Resp:  [16]   SpO2:  [91 %-94 %]   Weight:  [74.1 kg (163 lb 4.8 oz)]   Gen appearance: older Caucasian female, sitting up in recliner, awake and smiling, endorses 6/10 abd pain around PEG but otherwise in good spirits and having good day  Lungs: easy resp, intermittent junky cough -encouraged to use flutter valve, RA  Heart: reg  Abd: large, soft, PEG intact/clamped, dsg intact, stool  today  Extrem: mild to mod edematous LEs  Skin:  Dry, ted stockings on LEs  Neuro: A+O x 3 when visited early afternoon, she had ambulated x 2 w/ walker around unit today & planned to ambulate again when dtr [place] visits, encouraged to go outside on this beautiful sunny blue sky day    Assessment/Plan:  [name] w/ pancreatic CA s/p Whipple (treatment for cure) w/ prolonged hospitalization. Pt looking and feeling better, now off IV abx and pain better controlled on methadone + PO dilaudid prn w/ very occasional IV dilaudid prn. Pt in good spirits today, had good appetite for b'fst, drank Ensure for lunch and has been making strides w/ ambulating regularly w/ walker.    Pain/dyspnea  Dilaudid 8 mg po Q 2hrs prn (x 3 on 6/10, x 5 on 6/11) 56 mg in last 24 hrs  Dilaudid 1 mg IV Q 2 hrs prn (last x 1 on 6/10 at 3 pm)  Lyrica 75 mg po BID for fibromyalgia (total 150 mg/d, increased 5/28)  Methadone 10 mg po TID (total 30 mg/d, increased last 6/6) last ECG on 5/29 w/ QTc 426  Albuterol neb Q 6 hrs prn (none used)  Lidocaine patch daily x 2 (abd, LUE)  Acetaminophen 650 mg po Q 6 hrs prn fever  Bengay cream 4x/d prn to joints (x 1 on 6/11)  Guaifenesin 600 mg po BID expectorant    Anxiety/agitation/nausea/fatigue  Cymbalta 20 mg po/d  Metoclopramide 10 mg po TID AC - if pt continues w/ freq stools consider stopping  Pantoprazole 20 mg po BID  Ondansetron 4 mg IV Q 6 hrs prn (x 1 on 6/10, x 1 on 6/11)  Promethazine 12.5 mg IV Q 6 hrs prn (x 1 on 6/11)  Ativan 0.25 mg IV Q 6 hrs prn (last x 1 on 5/16)  Encouraged complementary adjuvant therapies for coping, meditation app, aromatherapy, gentle massage      Prevention of opioid induced constipation, last stool 6/11  Bisacodyl supp pr daily (not taking or needed - would switch to PRN)  Miralax 17 gm po daily  Senna 2 tab po BID    Skin/oral care  Aquaphor prn   Eucerin prn for BLEs  Miconazole powder BID under breasts    GOC/prognosis  Full code  Dtr Rod Mae  is HCP (very supportive and informed, former [facility] RN)  Goal currently is to prioritize pt's symptom control, long course though pt has endorsed agreement in past w/ continuing current treatment plan, dispo to SNF in hopes of eventually making recovery back to independent living    Pt case discussed w/ RN. We will continue to follow along. Please call if questions/concerns.    Total Time Spent 35 minutes:             >50% of time was spent in counseling and/or coordination of care.     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    Anemia D64.9    Hypothyroidism E03.9    Anemia D64.9     hx of Pulmonary embolism I26.99    Acute pulmonary insufficiency J98.4    Depression F32.9    Sepsis A41.9       No Known Allergies (drug, envir, food or latex)    Scheduled Meds:    sodium chloride  1 g Per G Tube TID PC    lidocaine  1 patch Transdermal Q24H    pantoprazole  40 mg Oral BID AC    piperacillin-tazobactam  4.5 g Intravenous Q6H    methadone  10 mg Oral TID    PROSOURCE NO CARB  30 mL Oral BID WC    pregabalin  75 mg Oral 2 times per day    lidocaine  1 patch Transdermal Q24H    furosemide  10 mg Oral BID    senna  2 tablet Oral 2 times per day    miconazole   Topical 2 times per day    guaiFENesin  600 mg Oral 2 times per day    metoclopramide  10 mg Oral TID AC    levothyroxine  150 mcg Oral Daily @ 0600    enoxaparin  40 mg Subcutaneous Daily @ 2100    polyethylene glycol  17 g Oral Daily    lactobacillus rhamnosus (GG)  1 capsule Oral Daily    bisacodyl  10 mg Rectal Daily    DULoxetine  20 mg Oral Q24H       Continuous Infusions:       PRN Meds:  sodium chloride, dextrose,  HYDROmorphone, HYDROmorphone PF **OR** HYDROmorphone  PF, naloxone, promethazine, ondansetron, albuterol, methyl salicylate-menthol, mineral oil-hydrophilic petrolatum, sodium chloride, sodium chloride

## 2017-08-13 NOTE — Progress Notes (Addendum)
Surgical Oncology Surgery Progress Note    Patient: Amanda Galloway    LOS: 137 days    Attending: [place]. AFVSS.   Reports stiffness in upper arms/shoulder, describes muscular pain.  Denies nausea or emesis.    Ambulating with assistance   Denies f/c, CP/SOB.      OBJECTIVE  Physical Exam:  Temp:  [35.6 C (96.1 F)-37.1 C (98.8 F)] 36.4 C (97.5 F)  Heart Rate:  [90-100] 93  Resp:  [16] 16  BP: (102-128)/(50-70) 110/50   GEN: no apparent distress  HEENT: NCAT, face symmetric  CHEST: nonlabored respirations  ABD: soft, mildly distended, tender surrounding G-tube that is CDI, minimal erythema, without active leaking   NEURO/MOTOR: alert, appropriate  EXTREMITIES: atraumatic  VASCULAR: feet wwp, no edema    Intake/Output:  06/10 0700 - 06/11 0659  In: 2183.8 [P.O.:860; I.V.:10]  Out: 0      Medications:  Current Facility-Administered Medications   Medication    sodium chloride tablet 1 g    HYDROmorphone (DILAUDID) tablet 8 mg    HYDROmorphone (DILAUDID) injection 0.5 mg    Or    HYDROmorphone (DILAUDID) injection 1 mg    lidocaine (LIDODERM) 5 % patch 1 patch    naloxone (NARCAN) 0.4 mg/mL injection 0.1 mg    pantoprazole (PROTONIX) EC tablet 40 mg    piperacillin-tazobactam (ZOSYN) IVPB 4.5 g    methadone (DOLOPHINE) tablet 10 mg    PROSOURCE NO CARB liquid LIQD 30 mL    pregabalin (LYRICA) capsule 75 mg    lidocaine (LIDODERM) 5 % patch 1 patch    furosemide (LASIX) tablet 10 mg    senna (SENOKOT) tablet 2 tablet    promethazine (PHENERGAN) 12.5 mg in sodium chloride 0.9% 25 mL IVPB    miconazole (MICATIN) 2 % powder    ondansetron (ZOFRAN) injection 4 mg    guaiFENesin (MUCINEX) 12 hr tablet 600 mg    metoclopramide (REGLAN) tablet 10 mg    levothyroxine (SYNTHROID, LEVOTHROID) tablet 150 mcg    enoxaparin (LOVENOX) injection 40 mg    albuterol (PROVENTIL) nebulization 2.5 mg    polyethylene glycol (GLYCOLAX,MIRALAX) powder 17 g    lactobacillus  rhamnosus (GG) (CULTURELLE) capsule 1 each    methyl salicylate-menthol (BENGAY) topical cream    bisacodyl (DULCOLAX) suppository 10 mg    DULoxetine (CYMBALTA) DR capsule 20 mg    mineral oil-hydrophilic petrolatum (AQUAPHOR) ointment    sodium chloride 0.9 % flush 10 mL    sodium chloride 0.9 % flush 10 mL     Facility-Administered Medications Ordered in Other Encounters   Medication    etomidate (AMIDATE) 2 mg/mL injection    rocuronium (ZEMURON) 10 mg/mL injection         Laboratory values:   Recent Labs      08/13/17   0053  08/12/17   0025   WBC  9.7  8.3   Hemoglobin  8.8*  7.9*   Hematocrit  29*  26*   Platelets  335  279   INR   --   1.2*   Sedimentation Rate   --   96*   CRP   --   46*     No components found with this basename: APTT, PT Recent Labs      08/13/17   0053  08/12/17   0025   Sodium  141  140   Potassium  4.3  4.1   Chloride  99  100   CO2  25  30*   UN  17  16   Creatinine  0.84  0.88   Glucose  135*  118*   Calcium  8.9  8.9   Magnesium   --   1.8   Phosphorus   --   4.5    Recent Labs      08/13/17   0053  08/12/17   0025   AST  54*  52*   ALT  40*  42*   Alk Phos  851*  853*   Bilirubin,Total  0.4  0.4     Recent Labs      08/13/17   0053  08/12/17   0025   Total Protein  7.7  7.0   Albumin  3.4*  3.0*   Prealbumin   --   11*     No results for input(s): AMY, LIP in the last 72 hours.   GLUCOSE:   No results for input(s): PGLU in the last 72 hours.  Imaging: No results found.     ASSESSMENT  Amanda Galloway is a 72 y.o. female with h/o recent PE and pancreatic cancer POD # 102  status post whipple procedure complicated by delayed gastric emptying.  NGT replaced on 04/04/17. UGI on 04/12/17 concerning for obstruction just beyond the [place] in the efferent limb.  Course complicated on 08/08/28 by rising LFTs and sepsis with CT concerning for cholangitis given biliary tree obstruction and PV compression due to hematoma and afferent limb distention in setting of PE treatment. Is s/p IR PTC on  04/16/17 and IR percutaneous aspiration of anterior abdominal fluid collection on 04/24/17. IR LUQ perc drain placement 3/1. IR anterior abdominal 54f perc drain placement 3/3. IR anterior perc drain into left hepatic collection 3/6. Developed E. Coli bacteremia on 07/26/17 presumed secondary to aspiration pneumonia.    PLAN   Monitor mental status   Continue Zosyn and Linezolid, appreciate ID assistance  Regular diet with TF, decreasing to stimulate appetite, Continue Calorie Count  C/w aggressive pulmonary toilet, OOB/ambulate, PT/OT  Bowel regimen   Analgesia with PO pain. Appreciate palliative care assistance   DVT ppx - Lovenox   Dispo: [id number]   Author: DKathrin Greathouse MD as of: 08/13/2017  at: 9:02 AM         HPB-GI Attending Addendum:   I personally examined the patient, reviewed the notes, and discussed the plan of care with the residents and patient. Agree with detailed resident's note. Please see the note above for details of history, exam, labs, assessment/plan which reflect my input.     72year old female with pancreatic cancer status post pancreaticoduodenectomy complicated by fluid collections and malnutrition, and gastrostomy tube placement with perforation which has resolved.     Doing well with good spirit and increased functional activity.     Pulmonary effusions. Stable.   Malnutrition. Encouraging oral intake. Calorie counts. Will decrease gastrostomy tube feeding.   Trial of oral intake.   Sepsis with EColi Bacteremia due to biliary source/cholangitis. Sensitive to Augmentin. Will need long-term suppressive antibiotics.   Hyperglycemia with intermittent need for sliding scale.   Elevated liver enzymes related to recent cholangitis. Improving.   Anemia of chronic disease. Iron supplementation and oral dietary encouragement.     EDelano Metz MD, FACS  Hepatobiliary, Pancreas & GI Surgery

## 2017-08-13 NOTE — Plan of Care (Signed)
Problem: Safety  Goal: Patient will remain free of falls  Outcome: Maintaining      Problem: Pain/Comfort  Goal: Patient's pain or discomfort is manageable  Outcome: Maintaining    Goal: Patient's pain or discomfort is manageable  Outcome: Adequate for discharge Date Met: 08/13/17      Problem: Mobility  Goal: Functional status is maintained or improved - Geriatric  Outcome: Progressing towards goal    Goal: Patient's functional status is maintained or improved  Outcome: Adequate for discharge Date Met: 08/13/17      Problem: Nutrition  Goal: Patient's nutritional status is maintained or improved  Outcome: Maintaining    Goal: Nutritional status is maintained or improved - Geriatric  Outcome: Maintaining      Problem: Cognitive function  Goal: Cognitive function will be maintained  Outcome: Maintaining      Problem: Bowel Elimination  Goal: Elimination patterns are normal or improving  Outcome: Maintaining      Problem: Post-Operative Hemodynamic Stability  Goal: Maintain Hemodynamic Stability  Outcome: Maintaining      Problem: Post-Operative Complications  Goal: Prevent post-operative complications  Outcome: Progressing towards goal      Problem: Psychosocial  Goal: Demonstrates ability to cope with illness  Outcome: Progressing towards goal      Problem: Fluid and Electrolyte Imbalance  Goal: Fluid and Electrolyte imbalance  Outcome: Maintaining      Problem: GI Bleeding Elimination  Goal: Elimination of patterns are normal or improving  Outcome: Maintaining      Problem: Post-Operative Bladder Elimination  Goal: Patient is able to empty bladder or return to baseline  Outcome: Maintaining

## 2017-08-13 NOTE — Progress Notes (Signed)
Jaasia Viglione is currently functioning below her baseline and will likely require SNF rehab before returning to she prior living arrangement. she will likely return to her prior level of functioning with a short term rehab stay. However, if discharge directly to home is desired, Giavonna Pflum will need 24 hour assistance with all mobility and require Home PT     PT Discharge Equipment Recommended: To be determined if returning to their prior living environment today.    Physical Therapy Treatment Note:     08/13/17 1115   PT [place]   Visit Number   Visit Number [facility]) / Treatment Day [facility]) 7   Visit Details [facility])   Visit Type [facility]) Follow Up   Reason for visit [facility]) General   Precautions/Observations   Precautions used Yes   LDA Observation Feeding tube;IV lines  (RN disconnected from all)   Fall Precautions General falls precautions   Other Pt up in recliner chair, agreeable for PT. Have walked with RN this morning. RN in to assist with disconnecting from lines.   Pain Assessment   *Is the patient currently in pain? Yes   Pain (Before,During, After) Therapy Before;During;After   0-10 Scale 5   Pain Location Abdomen;Leg   Pain Orientation Right;Left   Pain Intervention(s) Repositioned;Refer to nursing for pain management   Cognition   Arousal/Alertness Appropriate responses to stimuli   Orientation Oriented to person;Oriented to place;Oriented to situation;Disoriented to time   Following Commands Follows simple commands with repetition   Additional Comments Pt was able to recall events over the weekend. Reported that it was June, required cues for year and date.   Bed Mobility   Bed mobility Tested   Supine to Sit Contact guard;1 person assist;Verbal cues;Side rails down;Head of bed flat   Sit to Supine Minimum assist ;1 person assist;Verbal cues;Side rails down;Head of bed flat   Additional comments Pt completed with HOB flat and no bed rails. PT requried minAx1 for into bed with lifting  BLE into bed. Pt utilized mometum strategy for supine to sit, requiring CGA at trunk to assist out but able to bring BLE out without hands on assist.   Transfers   Transfers Tested   Sit to Stand Minimum ;1 person assist;Verbal cues   Stand to sit Minimum ;1 person assist;Verbal cues   Transfer Assistive Device rolling walker   Additional comments Pt completed from recliner and edge of bed. Pt requried VCs for scooting towards edge of seat and hand placement to prepare for transfer. Pt requried minAx1 at trunk to encourgae forward trunk flexion. Pt slow to rise and requried hands on assist to complete.   Mobility   Mobility Tested   Gait Pattern Decreased cadence;Trunk flexed   Ambulation Assist Stand by;1 person assist   Ambulation Distance (Feet) ~438ft   Ambulation Assistive Device rolling walker   Stairs Assistance Contact guard;1 person assist   Stair Management Technique One rail;Step to pattern;Forwards   Number of Stairs 12   Additional comments Pt ambulated at slow giat speed with trunk flexed and elevated shoulders. Increased VCs for hip extension and shoulder depression, pt able to maintain for short time but then requiring repetivitive VCs. No overt loss of balance t/o. Pt able to complete stairs with single rail requring CGA at trunk for improved stability with step to gait pattern.   Balance   Balance Tested   Sitting - Static Independent ;Unsupported   Standing - Static Standby assist;Supported  Standing - Dynamic Min assist;Unsupported  (SBA supported)   PT AM-PAC Mobility   Turning over in bed? 1   Sitting down on and standing up from a chair with arms? 1   Moving from lying on back to sitting on the side of the bed? 1   Moving to and from a bed to a chair? 3   Need to walk in hospital room? 3   Climbing 3 - 5 steps with a railing? 3   Total Raw Score 12   Standardized Score 35.33   CMS 1-100% Score 69   Assessment   Brief Assessment Remains appropriate for skilled therapy   Problem List Impaired  functional mobility;Impaired transfers;Impaired ambulation;Impaired bed mobility;Impaired stair navigation;Impaired balance;Impaired endurance;Impaired LE strength   Additional Comments Pt was able to progress to decrease assist for ambulation but requried VCs for posture. PT does still require hands on assist for bed mobility, transfers, and stairs. Pt will continue to benefit from short rehab bout to maximize function and promote independence.   Plan/Recommendation   Treatment Interventions [facility]   PT Frequency 2-4x/wk   Mobility Recommendations 1A with RW, please assist with ambulation out of room 3x/day   Discharge Recommendations [place]   PT Discharge Equipment Recommended To be determined   Assessment/Recommendations Reviewed With: Patient;Nursing;Social Worker   Next PT Visit progress all mobility; higher level balance with ambulation, dual task   Time Calculation   PT Timed Codes 26   PT Untimed Codes 0   PT Unbilled Time 0   PT Total Treatment 26   Plan and Onset date   Plan of Care Date 08/08/17   Onset Date 03/29/17   Treatment Start Date 07/08/17     Rodena Piety, PT, DPT  Pager [phone]

## 2017-08-14 LAB — COMPREHENSIVE METABOLIC PANEL
ALT: 35 U/L (ref 0–35)
AST: 51 U/L — ABNORMAL HIGH (ref 0–35)
Albumin: 3.2 g/dL — ABNORMAL LOW (ref 3.5–5.2)
Alk Phos: 819 U/L — ABNORMAL HIGH (ref 35–105)
Anion Gap: 13 (ref 7–16)
Bilirubin,Total: 0.5 mg/dL (ref 0.0–1.2)
CO2: 28 mmol/L (ref 20–28)
Calcium: 8.9 mg/dL (ref 8.6–10.2)
Chloride: 101 mmol/L (ref 96–108)
Creatinine: 0.81 mg/dL (ref 0.51–0.95)
GFR,Black: 84 *
GFR,Caucasian: 73 *
Glucose: 100 mg/dL — ABNORMAL HIGH (ref 60–99)
Lab: 17 mg/dL (ref 6–20)
Potassium: 4.1 mmol/L (ref 3.3–5.1)
Sodium: 142 mmol/L (ref 133–145)
Total Protein: 7.6 g/dL (ref 6.3–7.7)

## 2017-08-14 LAB — CBC AND DIFFERENTIAL
Baso # K/uL: 0.1 10*3/uL (ref 0.0–0.1)
Basophil %: 1 %
Eos # K/uL: 0.5 10*3/uL — ABNORMAL HIGH (ref 0.0–0.4)
Eosinophil %: 6 %
Hematocrit: 26 % — ABNORMAL LOW (ref 34–45)
Hemoglobin: 8.1 g/dL — ABNORMAL LOW (ref 11.2–15.7)
IMM Granulocytes #: 0 10*3/uL
IMM Granulocytes: 0.4 %
Lymph # K/uL: 1.2 10*3/uL (ref 1.2–3.7)
Lymphocyte %: 15.5 %
MCH: 30 pg/cell (ref 26–32)
MCHC: 31 g/dL — ABNORMAL LOW (ref 32–36)
MCV: 98 fL — ABNORMAL HIGH (ref 79–95)
Mono # K/uL: 0.9 10*3/uL (ref 0.2–0.9)
Monocyte %: 11.5 %
Neut # K/uL: 5.2 10*3/uL (ref 1.6–6.1)
Nucl RBC # K/uL: 0 10*3/uL (ref 0.0–0.0)
Nucl RBC %: 0 /100 WBC (ref 0.0–0.2)
Platelets: 321 10*3/uL (ref 160–370)
RBC: 2.7 MIL/uL — ABNORMAL LOW (ref 3.9–5.2)
RDW: 20.7 % — ABNORMAL HIGH (ref 11.7–14.4)
Seg Neut %: 65.6 %
WBC: 7.9 10*3/uL (ref 4.0–10.0)

## 2017-08-14 LAB — MAGNESIUM: Magnesium: 2 mg/dL (ref 1.6–2.5)

## 2017-08-14 LAB — PHOSPHORUS: Phosphorus: 4.3 mg/dL (ref 2.7–4.5)

## 2017-08-14 MED ORDER — AMOXICILLIN-POT CLAVULANATE 875-125 MG PO TABS *I*
1.0000 | ORAL_TABLET | Freq: Two times a day (BID) | ORAL | Status: DC
Start: 2017-08-14 — End: 2017-08-16
  Administered 2017-08-14 – 2017-08-16 (×5): 1 via ORAL
  Filled 2017-08-14 (×7): qty 1

## 2017-08-14 NOTE — Progress Notes (Addendum)
Surgical Oncology Surgery Progress Note    Patient: Amanda Galloway    LOS: 138 days    Attending: [place], AFVSS. TFs decreased last night, plan to d/c today. Ambulating and voiding without issue.  Resting comfortably this AM.      OBJECTIVE  Physical Exam:  Temp:  [35.6 C (96.1 F)-36.6 C (97.9 F)] 35.6 C (96.1 F)  Heart Rate:  [92-98] 92  Resp:  [16] 16  BP: (106-110)/(50-70) 108/60     GEN: no apparent distress  HEENT: NCAT, face symmetric  CHEST: nonlabored respirations  ABD: soft, mildly distended, tender surrounding G-tube that is CDI, minimal erythema, without active leaking   NEURO/MOTOR: alert, appropriate  EXTREMITIES: atraumatic  VASCULAR: feet wwp, no edema    Intake/Output:  06/11 0700 - 06/12 0659  In: 1732.8 [P.O.:590; I.V.:107.8]  Out: 700      Medications:  Current Facility-Administered Medications   Medication    sodium chloride tablet 1 g    sodium chloride 0.9 % FLUSH REQUIRED IF PATIENT HAS IV    dextrose 5 % FLUSH REQUIRED IF PATIENT HAS IV    HYDROmorphone (DILAUDID) tablet 8 mg    HYDROmorphone (DILAUDID) injection 0.5 mg    Or    HYDROmorphone (DILAUDID) injection 1 mg    lidocaine (LIDODERM) 5 % patch 1 patch    naloxone (NARCAN) 0.4 mg/mL injection 0.1 mg    pantoprazole (PROTONIX) EC tablet 40 mg    piperacillin-tazobactam (ZOSYN) IVPB 4.5 g    methadone (DOLOPHINE) tablet 10 mg    PROSOURCE NO CARB liquid LIQD 30 mL    pregabalin (LYRICA) capsule 75 mg    lidocaine (LIDODERM) 5 % patch 1 patch    furosemide (LASIX) tablet 10 mg    senna (SENOKOT) tablet 2 tablet    promethazine (PHENERGAN) 12.5 mg in sodium chloride 0.9% 25 mL IVPB    miconazole (MICATIN) 2 % powder    ondansetron (ZOFRAN) injection 4 mg    guaiFENesin (MUCINEX) 12 hr tablet 600 mg    metoclopramide (REGLAN) tablet 10 mg    levothyroxine (SYNTHROID, LEVOTHROID) tablet 150 mcg    enoxaparin (LOVENOX) injection 40 mg    albuterol (PROVENTIL) nebulization  2.5 mg    polyethylene glycol (GLYCOLAX,MIRALAX) powder 17 g    lactobacillus rhamnosus (GG) (CULTURELLE) capsule 1 each    methyl salicylate-menthol (BENGAY) topical cream    bisacodyl (DULCOLAX) suppository 10 mg    DULoxetine (CYMBALTA) DR capsule 20 mg    mineral oil-hydrophilic petrolatum (AQUAPHOR) ointment    sodium chloride 0.9 % flush 10 mL    sodium chloride 0.9 % flush 10 mL     Facility-Administered Medications Ordered in Other Encounters   Medication    etomidate (AMIDATE) 2 mg/mL injection    rocuronium (ZEMURON) 10 mg/mL injection         Laboratory values:   Recent Labs      08/13/17   2355  08/13/17   0053  08/12/17   0025   WBC  7.9  9.7  8.3   Hemoglobin  8.1*  8.8*  7.9*   Hematocrit  26*  29*  26*   Platelets  321  335  279   INR   --    --   1.2*   Sedimentation Rate   --    --   96*   CRP   --    --  46*     No components found with this basename: APTT, PT Recent Labs      08/13/17   2355  08/13/17   0053  08/12/17   0025   Sodium  142  141  140   Potassium  4.1  4.3  4.1   Chloride  101  99  100   CO2  28  25  30*   UN  _0 Creatinine  0.81  0.84  0.88   Glucose  100*  135*  118*   Calcium  8.9  8.9  8.9   Magnesium  2.0   --   1.8   Phosphorus  4.3   --   4.5    Recent Labs      08/13/17   2355  08/13/17   0053   AST  51*  54*   ALT  35  40*   Alk Phos  819*  851*   Bilirubin,Total  0.5  0.4     Recent Labs      08/13/17   2355  08/13/17   0053  08/12/17   0025   Total Protein  7.6  7.7  7.0   Albumin  3.2*  3.4*  3.0*   Prealbumin   --    --   11*     No results for input(s): AMY, LIP in the last 72 hours.   GLUCOSE:   No results for input(s): PGLU in the last 72 hours.  Imaging: No results found.     ASSESSMENT  Amanda Galloway is a 72 y.o. female with h/o recent PE and pancreatic cancer POD # 102  status post whipple procedure complicated by delayed gastric emptying.  NGT replaced on 04/04/17. UGI on 04/12/17 concerning for obstruction just beyond the [place] in the efferent  limb.  Course complicated on 0/45/99 by rising LFTs and sepsis with CT concerning for cholangitis given biliary tree obstruction and PV compression due to hematoma and afferent limb distention in setting of PE treatment. Is s/p IR PTC on 04/16/17 and IR percutaneous aspiration of anterior abdominal fluid collection on 04/24/17. IR LUQ perc drain placement 3/1. IR anterior abdominal 23f perc drain placement 3/3. IR anterior perc drain into left hepatic collection 3/6. Developed E. Coli bacteremia on 07/26/17 presumed secondary to aspiration pneumonia.    PLAN  Continue Zosyn, poss d/c today   Regular diet d/c TFs today   C/w aggressive pulmonary toilet, OOB/ambulate, PT/OT  Bowel regimen   Analgesia with PO pain. Appreciate palliative care assistance   DVT ppx - Lovenox   Dispo: [id number]   Author: MMelburn Hake MD as of: 08/14/2017  at: 7:06 AM         HPB-GI Attending Addendum:   I personally examined the patient, reviewed the notes, and discussed the plan of care with the residents and patient. Agree with detailed resident's note. Please see the note above for details of history, exam, labs, assessment/plan which reflect my input.     72year old female with pancreatic cancer status post pancreaticoduodenectomy complicated by fluid collections and malnutrition, and gastrostomy tube placement with perforation which has resolved.     Doing well with good spirit and increased functional activity.   Episode of vomiting overnight without cause.     Pulmonary effusions. Stable.   Malnutrition. Encouraging oral intake. Calorie counts. Hold gastrostomy tube feeding.   Trial of oral intake.   Sepsis  with EColi Bacteremia due to biliary source/cholangitis. Resolved.      Will need long-term suppressive antibiotics.   Hyperglycemia with intermittent need for sliding scale.   Elevated liver enzymes related to recent cholangitis. Improving.   Anemia of chronic disease. Iron supplementation and oral dietary encouragement.      Delano Metz, MD, FACS  Hepatobiliary, Pancreas & GI Surgery

## 2017-08-14 NOTE — Progress Notes (Signed)
Palliative Care Progress Note    HPI: [name] w/ pancreatic CA s/p Whipple 1/25 (treatment for cure) w/ prolonged complicated hospitalization issues w/ infection, nutrition/appetite/nausea/constipation, s/p PEG w/ perf now resolved. Pt resumed methadone on 5/8 and weaning off her dilaudid PCA 5/23, most recent methadone increase on 6/6. All abd drains now removed, still receiving TFs at night to optimize appetite during day. Pt w/ recurrent sepsis alerts ~ every 4 days, most recently 6/7 when having PEG tube changed out & issues w/ encephalopathy over WE which improved w/ low dose narcan (0.1 mg on 6/8). Pt now finishing IV abx (6/11) and goal all PO meds in anticipation of dispo to SNF rehab.    Subjective: "I'm getting ready to walk, just taking my pain medicine first"    Objective: chart reviewed, pt last used dilaudid lower dose IV at 1 pm today then took PO dilaudid at 230 pm before ambulating for 4th time around unit, she was in good spirits and enjoyed banter w/ staff and stopping & saying hello to various staff along the way; Saniya acknowledges she is feeling stronger, she went outside yesterday and plans to again today when dtr [facility] visits     Palliative Care ROS: abd pain by PEG tube 4/10 -improves w/ po dilaudid & did ok out walking w/ distraction  Pain   Moderate  Anorexia   Moderate  Shortness of Breath   Mild  Tiredness/Fatigue   Mild  Drowsiness/Sleepiness   Mild  Constipation   no  Unable to Respond   no  Delirium   no   Last stool 6/12 x 2    Physical Examination:   BP: (108-120)/(60-70)   Temp:  [35.6 C (96.1 F)-36.8 C (98.2 F)]   Temp src: Temporal (06/12 1208)  Heart Rate:  [92-98]   Resp:  [16]   SpO2:  [93 %-97 %]   Gen appearance: older Caucasian female, sitting up in recliner, awake and smiling, endorses 4/10 abd pain around PEG but otherwise in good spirits and having good day  Lungs: easy resp, intermittent junky cough -encouraged to use flutter valve, RA  Heart: reg  Abd: large,  soft, PEG intact/clamped, dsg intact, stool today  Extrem: mild to mod edematous LEs  Skin:  Dry, ted stockings on LEs  Neuro: A+O x 3 when visited early afternoon, she had ambulated x 4 so far today w/ walker around unit today & planned to ambulate again & visit outside when dtr [place] visiting     Assessment/Plan:  [name] w/ pancreatic CA s/p Whipple (treatment for cure) w/ prolonged hospitalization. Pt looking and feeling better, now off IV abx and pain better controlled on methadone + PO dilaudid prn w/ very occasional IV dilaudid prn. Pt in good spirits and now w/ ambulating regularly w/ walker, does well .    Pain/dyspnea  Dilaudid 8 mg po Q 2hrs prn (x 6 on 6/11, x 2 on 6/12) 56 mg in last 24 hrs (consistent use)  Dilaudid 1 mg IV Q 2 hrs prn (last x 1 on 6/10 at 3 pm)  Dilaudid 0.5 mg IV Q 2 hrs prn (x 1 on 6/12)  Lyrica 75 mg po BID for fibromyalgia (total 150 mg/d, increased 5/28)  Methadone 10 mg po TID (total 30 mg/d, increased last 6/6) last ECG on 5/29 w/ QTc 426  Albuterol neb Q 6 hrs prn (none used)  Lidocaine patch daily x 2 (abd, LUE)  Acetaminophen 650 mg po  Q 6 hrs prn fever   Bengay cream 4x/d prn to joints (x 1 on 6/11)  Guaifenesin 600 mg po BID expectorant    Anxiety/agitation/nausea/fatigue  Cymbalta 20 mg po/d  Metoclopramide 10 mg po TID AC  Pantoprazole 20 mg po BID  Ondansetron 4 mg IV Q 6 hrs prn (x 2 on 6/11)  Promethazine 12.5 mg IV Q 6 hrs prn (x 5 on 6/11)  Ativan 0.25 mg IV Q 6 hrs prn (last x 1 on 5/16)  Encouraged complementary adjuvant therapies for coping, meditation app, aromatherapy, gentle massage      Prevention of opioid induced constipation, last stool 6/12  Bisacodyl supp pr daily (not taking or needed - would switch to PRN)  Miralax 17 gm po daily  Senna 2 tab po BID    Skin/oral care  Aquaphor prn   Eucerin prn for BLEs  Miconazole powder BID under breasts    GOC/prognosis  Full code  Dtr Rod Mae is HCP (very supportive and informed, former [facility]  RN)  Goal currently is to prioritize pt's symptom control, long course though pt has endorsed agreement in past w/ continuing current treatment plan, dispo to SNF in hopes of eventually making recovery back to independent living    Pt case discussed w/ RNs & Dr Ron Agee. Pt responding excellent to positive reinforcement from the fabulous [facility] staff! We will continue to follow along. Please call if questions/concerns.    Total Time Spent 40 minutes:             >50% of time was spent in counseling and/or coordination of care.     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    Anemia D64.9    Hypothyroidism E03.9    Anemia D64.9     hx of Pulmonary embolism I26.99    Acute pulmonary insufficiency J98.4    Depression F32.9    Sepsis A41.9       No Known Allergies (drug, envir, food or latex)    Scheduled Meds:    amoxicillin-clavulanate  1 tablet Oral 2 times per day    sodium chloride  1 g Per G Tube TID PC    lidocaine  1 patch Transdermal Q24H    pantoprazole  40 mg Oral BID AC    methadone  10 mg Oral TID    PROSOURCE NO CARB  30 mL Oral BID WC    pregabalin  75 mg Oral 2 times per day    lidocaine  1 patch Transdermal Q24H    furosemide  10 mg Oral BID    senna  2 tablet Oral 2 times per day    miconazole   Topical 2 times per day    guaiFENesin  600 mg Oral 2 times per day    metoclopramide  10 mg Oral TID AC    levothyroxine  150 mcg Oral Daily @ 0600    enoxaparin  40 mg Subcutaneous Daily @ 2100    polyethylene glycol  17 g Oral Daily    lactobacillus rhamnosus (GG)  1 capsule Oral Daily    bisacodyl  10 mg Rectal Daily    DULoxetine  20 mg Oral Q24H       Continuous Infusions:  PRN Meds:  sodium chloride, dextrose,  HYDROmorphone, HYDROmorphone PF **OR** HYDROmorphone PF, naloxone, promethazine, ondansetron, albuterol, methyl salicylate-menthol, mineral oil-hydrophilic petrolatum, sodium chloride, sodium chloride

## 2017-08-14 NOTE — Progress Notes (Signed)
SPIRITUAL ASSESSMENT NOTE    Unit chaplain stopped in for follow up.  Patient reflecting on family, church. Misses family and anxious for daughter to arrive for visit.  Anxious about whether her health is improving/making progress.  Prayed for strength in the process and in coping with pain.    Identified Religion: Christian     Spiritual Concern: Lonely, Powerlessness  Spiritual Practices: Writer, Prayer     Patient Coping: Anxious, Grateful, Open discussion  Family Coping: Accepting, Hopeful/ optimistic  Chaplain Interventions: Prayer, Encourage patient to use religious faith as resource, Normalized feelings, Reflective listening    Plan of Care:  Unit chaplain to continue visits when avail. Patient aware chaplain on-call avail 24/7.    Shirlean Kelly      2:16 PM on 08/14/2017

## 2017-08-14 NOTE — Progress Notes (Signed)
08/14/17 0800   [facility] Patient Class Review   Patient Class Review Inpatient   Patient Class Effective 03/29/2017  Earnest Conroy RN   Utilization  Management  [id number]  Page [phone]

## 2017-08-14 NOTE — Plan of Care (Signed)
Problem: Safety  Goal: Patient will remain free of falls  Outcome: Maintaining      Problem: Pain/Comfort  Goal: Patient's pain or discomfort is manageable  Outcome: Maintaining      Problem: Mobility  Goal: Functional status is maintained or improved - Geriatric  Outcome: Progressing towards goal      Problem: Nutrition  Goal: Patient's nutritional status is maintained or improved  Outcome: Progressing towards goal    Goal: Nutritional status is maintained or improved - Geriatric  Outcome: Progressing towards goal      Problem: Cognitive function  Goal: Cognitive function will be maintained  Outcome: Maintaining      Problem: Bowel Elimination  Goal: Elimination patterns are normal or improving  Outcome: Maintaining      Problem: Post-Operative Hemodynamic Stability  Goal: Maintain Hemodynamic Stability  Outcome: Maintaining      Problem: Post-Operative Complications  Goal: Prevent post-operative complications  Outcome: Maintaining      Problem: Post-Operative Bowel Elimination  Goal: Elimination pattern is normal or improving  Outcome: Maintaining      Problem: Psychosocial  Goal: Demonstrates ability to cope with illness  Outcome: Progressing towards goal      Problem: Fluid and Electrolyte Imbalance  Goal: Fluid and Electrolyte imbalance  Outcome: Maintaining      Problem: GI Bleeding Elimination  Goal: Elimination of patterns are normal or improving  Outcome: Maintaining      Problem: Post-Operative Bladder Elimination  Goal: Patient is able to empty bladder or return to baseline  Outcome: Maintaining

## 2017-08-15 DIAGNOSIS — R112 Nausea with vomiting, unspecified: Secondary | ICD-10-CM

## 2017-08-15 LAB — CBC AND DIFFERENTIAL
Baso # K/uL: 0.1 10*3/uL (ref 0.0–0.1)
Basophil %: 1 %
Eos # K/uL: 0.7 10*3/uL — ABNORMAL HIGH (ref 0.0–0.4)
Eosinophil %: 8.9 %
Hematocrit: 25 % — ABNORMAL LOW (ref 34–45)
Hemoglobin: 8 g/dL — ABNORMAL LOW (ref 11.2–15.7)
IMM Granulocytes #: 0 10*3/uL
IMM Granulocytes: 0.5 %
Lymph # K/uL: 1.2 10*3/uL (ref 1.2–3.7)
Lymphocyte %: 14.5 %
MCH: 31 pg/cell (ref 26–32)
MCHC: 32 g/dL (ref 32–36)
MCV: 96 fL — ABNORMAL HIGH (ref 79–95)
Mono # K/uL: 0.9 10*3/uL (ref 0.2–0.9)
Monocyte %: 11.6 %
Neut # K/uL: 5.1 10*3/uL (ref 1.6–6.1)
Nucl RBC # K/uL: 0 10*3/uL (ref 0.0–0.0)
Nucl RBC %: 0 /100 WBC (ref 0.0–0.2)
Platelets: 309 10*3/uL (ref 160–370)
RBC: 2.6 MIL/uL — ABNORMAL LOW (ref 3.9–5.2)
RDW: 20.1 % — ABNORMAL HIGH (ref 11.7–14.4)
Seg Neut %: 63.5 %
WBC: 8.1 10*3/uL (ref 4.0–10.0)

## 2017-08-15 LAB — COMPREHENSIVE METABOLIC PANEL
ALT: 35 U/L (ref 0–35)
AST: 55 U/L — ABNORMAL HIGH (ref 0–35)
Albumin: 3.2 g/dL — ABNORMAL LOW (ref 3.5–5.2)
Alk Phos: 829 U/L — ABNORMAL HIGH (ref 35–105)
Anion Gap: 13 (ref 7–16)
Bilirubin,Total: 0.4 mg/dL (ref 0.0–1.2)
CO2: 26 mmol/L (ref 20–28)
Calcium: 9 mg/dL (ref 8.6–10.2)
Chloride: 101 mmol/L (ref 96–108)
Creatinine: 0.72 mg/dL (ref 0.51–0.95)
GFR,Black: 97 *
GFR,Caucasian: 84 *
Glucose: 107 mg/dL — ABNORMAL HIGH (ref 60–99)
Lab: 16 mg/dL (ref 6–20)
Potassium: 3.9 mmol/L (ref 3.3–5.1)
Sodium: 140 mmol/L (ref 133–145)
Total Protein: 7.4 g/dL (ref 6.3–7.7)

## 2017-08-15 LAB — BLOOD CULTURE
Bacterial Blood Culture: 0
Bacterial Blood Culture: 0

## 2017-08-15 LAB — H. PYLORI ANTIGEN, STOOL: H. pylori Stool Antigen EIA: POSITIVE — AB

## 2017-08-15 MED ORDER — ONDANSETRON HCL 2 MG/ML IV SOLN *I*
4.0000 mg | Freq: Every day | INTRAMUSCULAR | Status: DC
Start: 2017-08-15 — End: 2017-08-24
  Administered 2017-08-15 – 2017-08-23 (×8): 4 mg via INTRAVENOUS
  Filled 2017-08-15 (×9): qty 2

## 2017-08-15 MED ORDER — POTASSIUM CHLORIDE CRYS CR 20 MEQ PO TBCR *I*
20.0000 meq | ORAL_TABLET | Freq: Once | ORAL | Status: AC
Start: 2017-08-15 — End: 2017-08-15
  Administered 2017-08-15: 20 meq via ORAL
  Filled 2017-08-15: qty 1

## 2017-08-15 MED ORDER — PLASMA-LYTE IV SOLN *WRAPPED*
75.0000 mL/h | Status: AC
Start: 2017-08-15 — End: 2017-08-16
  Administered 2017-08-15 (×4): 75 mL/h
  Administered 2017-08-15: 75 mL/h via INTRAVENOUS
  Administered 2017-08-16: 75 mL/h
  Administered 2017-08-16: 75 mL/h via INTRAVENOUS
  Administered 2017-08-16 (×2): 75 mL/h
  Administered 2017-08-16 (×2): 75 mL/h via INTRAVENOUS

## 2017-08-15 NOTE — Progress Notes (Addendum)
Surgical Oncology Surgery Progress Note    Patient: Amanda Galloway    LOS: 139 days    Attending: [place]  Patient reports a large bout of emesis this AM which has her very upset and discouraged. Otherwise NAEO, AFVSS. Does not report feeling particularly nauseous prior to emesis. Ambulated without issue yesterday. Minimal PO intake. Voiding spontaneously.  Denies f/c, CP or SOB.       OBJECTIVE  Physical Exam:  Temp:  [36.4 C (97.5 F)-36.9 C (98.4 F)] 36.9 C (98.4 F)  Heart Rate:  [92-99] 99  Resp:  [16] 16  BP: (102-138)/(60-82) 138/82     GEN: no apparent distress  HEENT: NCAT, face symmetric  CHEST: nonlabored respirations  ABD: soft, mildly distended, tender surrounding G-tube that is CDI, minimal erythema, without active leaking   NEURO/MOTOR: alert, appropriate  EXTREMITIES: atraumatic  VASCULAR: feet wwp, no edema    Intake/Output:  06/12 0700 - 06/13 0659  In: 500 [P.O.:370; I.V.:10]  Out: 150 [Urine:150]     Medications:  Current Facility-Administered Medications   Medication    amoxicillin-clavulanate (AUGMENTIN) 875-125 MG per tablet 1 tablet    sodium chloride tablet 1 g    sodium chloride 0.9 % FLUSH REQUIRED IF PATIENT HAS IV    dextrose 5 % FLUSH REQUIRED IF PATIENT HAS IV    HYDROmorphone (DILAUDID) tablet 8 mg    HYDROmorphone (DILAUDID) injection 0.5 mg    Or    HYDROmorphone (DILAUDID) injection 1 mg    lidocaine (LIDODERM) 5 % patch 1 patch    naloxone (NARCAN) 0.4 mg/mL injection 0.1 mg    pantoprazole (PROTONIX) EC tablet 40 mg    methadone (DOLOPHINE) tablet 10 mg    PROSOURCE NO CARB liquid LIQD 30 mL    pregabalin (LYRICA) capsule 75 mg    lidocaine (LIDODERM) 5 % patch 1 patch    furosemide (LASIX) tablet 10 mg    senna (SENOKOT) tablet 2 tablet    promethazine (PHENERGAN) 12.5 mg in sodium chloride 0.9% 25 mL IVPB    miconazole (MICATIN) 2 % powder    ondansetron (ZOFRAN) injection 4 mg    guaiFENesin (MUCINEX) 12 hr tablet 600  mg    metoclopramide (REGLAN) tablet 10 mg    levothyroxine (SYNTHROID, LEVOTHROID) tablet 150 mcg    enoxaparin (LOVENOX) injection 40 mg    albuterol (PROVENTIL) nebulization 2.5 mg    polyethylene glycol (GLYCOLAX,MIRALAX) powder 17 g    lactobacillus rhamnosus (GG) (CULTURELLE) capsule 1 each    methyl salicylate-menthol (BENGAY) topical cream    bisacodyl (DULCOLAX) suppository 10 mg    DULoxetine (CYMBALTA) DR capsule 20 mg    mineral oil-hydrophilic petrolatum (AQUAPHOR) ointment    sodium chloride 0.9 % flush 10 mL    sodium chloride 0.9 % flush 10 mL     Facility-Administered Medications Ordered in Other Encounters   Medication    etomidate (AMIDATE) 2 mg/mL injection    rocuronium (ZEMURON) 10 mg/mL injection         Laboratory values:   Recent Labs      08/15/17   0034  08/13/17   2355   WBC  8.1  7.9   Hemoglobin  8.0*  8.1*   Hematocrit  25*  26*   Platelets  309  321     No components found with this basename: APTT, PT Recent Labs      08/15/17   0034  08/13/17  2355   Sodium  140  142   Potassium  3.9  4.1   Chloride  101  101   CO2  26  28   UN  16  17   Creatinine  0.72  0.81   Glucose  107*  100*   Calcium  9.0  8.9   Magnesium   --   2.0   Phosphorus   --   4.3    Recent Labs      08/15/17   0034  08/13/17   2355   AST  55*  51*   ALT  35  35   Alk Phos  829*  819*   Bilirubin,Total  0.4  0.5     Recent Labs      08/15/17   0034  08/13/17   2355   Total Protein  7.4  7.6   Albumin  3.2*  3.2*     No results for input(s): AMY, LIP in the last 72 hours.   GLUCOSE:   No results for input(s): PGLU in the last 72 hours.  Imaging: No results found.     ASSESSMENT  Amanda Galloway is a 72 y.o. female with h/o recent PE and pancreatic cancer POD # 102  status post whipple procedure complicated by delayed gastric emptying.  NGT replaced on 04/04/17. UGI on 04/12/17 concerning for obstruction just beyond the [place] in the efferent Gallow[place].  Course complicated on 9/84/21 by rising LFTs and sepsis with CT  concerning for cholangitis given biliary tree obstruction and PV compression due to hematoma and afferent [place] distention in setting of PE treatment. Is s/p IR PTC on 04/16/17 and IR percutaneous aspiration of anterior abdominal fluid collection on 04/24/17. IR LUQ perc drain placement 3/1. IR anterior abdominal 48f perc drain placement 3/3. IR anterior perc drain into left hepatic collection 3/6. Developed E. Coli bacteremia on 07/26/17 presumed secondary to aspiration pneumonia.    PLAN  Continue Augmentin   Regular diet   C/w aggressive pulmonary toilet, OOB/ambulate, PT/OT  Bowel regimen   Analgesia with PO pain. Appreciate palliative care assistance   DVT ppx - Lovenox   Dispo: [id number]   Author: MMelburn Hake MD as of: 08/15/2017  at: 6:45 AM         HPB-GI Attending Addendum:   I personally examined the patient, reviewed the notes, and discussed the plan of care with the residents and patient. Agree with detailed resident's note. Please see the note above for details of history, exam, labs, assessment/plan which reflect my input.     72year old female with pancreatic cancer status post pancreaticoduodenectomy complicated by fluid collections and malnutrition, and gastrostomy tube placement with perforation which has resolved.     Doing well with good spirit and increased functional activity.   Episode of vomiting this am without cause.     Pulmonary effusions. Stable.   Malnutrition. Encouraging oral intake. Calorie counts. Hold gastrostomy tube feeding.   Trial of oral intake.   Sepsis with EColi Bacteremia due to biliary source/cholangitis. Resolved.      Will need long-term suppressive antibiotics.   Hyperglycemia with intermittent need for sliding scale.   Elevated liver enzymes related to recent cholangitis. Improving.   Anemia of chronic disease. Iron supplementation and oral dietary encouragement.     Continued abdominal pain requiring excess narcotics. Considering possible alterantive etiologies  given normal imaging findings at location of gastrostomy tube.   Will ask GI to consider  endoscopy to assess for gastritis, gastric or jejunal ulceration.   Will check HPylori antigen by stool.     Delano Metz, MD, FACS  Hepatobiliary, Pancreas & GI Surgery

## 2017-08-15 NOTE — Consults (Addendum)
Gastroenterology Initial Consult    Admit Date:  03/29/2017  Attending Provider:  Delano Metz, MD                                    Consult reason: consideration of EGD with dilation    Subjective:  Chart reviewed. History obtained from patient and chart review    Amanda Galloway is 72 y.o. female with history of PE and pancreatic cancer s/p Whipple on 03/29/17. GI was initially consulted on 04/13/2017 for EGD for dilation of GOO. Our plan was to attempt EGD with dilation but the patient's course was complicated by rising LFTs and PV compression due a hematoma and afferent limb distention in the setting of PE treatment. She required an ICU stay with intubation for acute respiratory failure. She was eventually weaned from pressors and extubated on 2/17. She then developed severe constipation and then her course was further complicated by intraabdominal abscesses s/p IR drainage. Afterwards, she was found to have delayed gastric emptying. A PEG was placed by IR on 06/14/17 and she was tolerating tube feeds. Her PEG was exchanged on 08/09/17 after it was leaking. She was nearing discharge to [facility] it seems, and her tube feeds were discontinued yesterday. She developed nausea and emesis today and we are consulted for consideration of EGD.     The patient does not remember if she has been having nausea or emesis. She does note that she had a large amount of vomitus yesterday, but denies any today. She has pain by the PEG. No diarrhea. She has not been constipated. No hematemesis, hematochezia or melena. No fevers or chills.      Past Medical Hx:   Past Medical History:   Diagnosis Date    Acute kidney failure 03/31/2017    Cancer     Depression     Fibromyalgia     Hypothyroidism      Past Surgical Hx:   Past Surgical History:   Procedure Laterality Date    CHOLECYSTECTOMY      CHOLECYSTECTOMY, LAPAROSCOPIC  09/06/2016    HYSTERECTOMY  08/1986    Fibroids    KNEE SURGERY Right     PR INSERT TUNNELED CV CATH WITH PORT  Right 10/04/2016    Procedure: Right IJ MEDIPORT Insertion;  Surgeon: Delano Metz, MD;  Location: [facility] MAIN OR;  Service: Oncology General    PR LAP,DIAGNOSTIC ABDOMEN N/A 10/04/2016    Procedure: LAPAROSCOPY DIAGNOSTIC;  Surgeon: Delano Metz, MD;  Location: [facility] MAIN OR;  Service: Oncology General    PR PART [place] PANC,PROX+REMV DUOD+ANAST N/A 03/29/2017    Procedure: WHIPPLE PROCEDURE;  Surgeon: Delano Metz, MD;  Location[facility] MAIN OR;  Service: Oncology General    ROTATOR CUFF REPAIR Right     TONSILLECTOMY AND ADENOIDECTOMY       Social Hx: Social History     Social History    Marital status: Divorced     Spouse name: N/A    Number of children: N/A    Years of education: N/A     Occupational History    Not on file.     Social History Main Topics    Smoking status: Never Smoker    Smokeless tobacco: Never Used    Alcohol use No    Drug use: No    Sexual activity: Not on file     Social History Narrative  No narrative on file     Family Hx: family history includes Breast cancer in her mother; Cancer in her father; Diabetes in her sister; Heart Disease in her father; Obesity in her sister.    Review of Systems -  Gen: no fevers/chills  HEENT: had sore throat, no tinnitis  Cardiac: no CP, palpitations  Pulm: no SOB, cough, or wheezing  Abd: as per HPI  GU: no hematuria, dysuria  MSK: no joint pain, myalgias  Skin: no rashes, jaundice  Hematology: no bruising, no non-luminal bleeding   Endocrine: no heat/cold intolerance  Neuro: no headache, focal weakness  Psychiatric: no somnolence, confusion    Current Medications:  No current facility-administered medications on file prior to encounter.      Current Outpatient Prescriptions on File Prior to Encounter   Medication Sig Dispense Refill    potassium chloride SA (KLOR-CON M20) 20 mEq  tablet Take 20 mEq by mouth 2 times daily      famotidine (PEPCID) 20 MG tablet Take 1 tablet (20 mg total) by mouth 2 times daily 60 tablet 3    escitalopram (LEXAPRO) 20 MG  tablet Take 1 tablet (20 mg total) by mouth daily (Patient taking differently: Take 20 mg by mouth nightly   ) 30 tablet 3    amitriptyline (ELAVIL) 25 MG tablet Take 1 tablet (25 mg total) by mouth nightly 30 tablet 5    dexamethasone (DECADRON) 4 MG tablet Take 1 tablet (4 mg total) by mouth 2 times daily (Patient taking differently: Take 4 mg by mouth daily (with breakfast)   ) 60 tablet 5    docusate sodium (COLACE) 100 MG capsule Take 2 capsules (200 mg total) by mouth 2 times daily 120 capsule 5    metoclopramide (REGLAN) 5 MG tablet Take 1 tablet (5 mg total) by mouth 3 times daily (before meals) 90 tablet 5    pantoprazole (PROTONIX) 40 MG EC tablet Take 1 tablet (40 mg total) by mouth 2 times daily (before meals)   Swallow whole. Do not crush, break, or chew. 60 tablet 5    senna (SENOKOT) 8.6 MG tablet Take 2 tablets by mouth 2 times daily 120 tablet 5    PREMARIN 0.625 MG tablet Take 0.625 mg by mouth every morning         ondansetron (ZOFRAN-ODT) 4 MG disintegrating tablet Take 1 tablet (4 mg total) by mouth 3 times daily as needed for Nausea   Place on top of tongue. 60 tablet 2    levothyroxine (SYNTHROID, LEVOTHROID) 137 MCG tablet Take 1 tablet (137 mcg total) by mouth daily (before breakfast) 30 tablet 0    Vitamins - senior (CENTRUM SILVER) TABS Take 1 tablet by mouth daily (Patient taking differently: Take 1 tablet by mouth every morning   ) 30 tablet 0    triamterene-hydrochlorothiazide (MAXZIDE-25) 37.5-25 MG per tablet Take 1 tablet by mouth daily (Patient taking differently: Take 1 tablet by mouth every morning   ) 30 tablet 0    fluorouracil (ADRUCIL) 50 MG/ML injection Administer 3,477 mg into the vein every 14 days   Start in infusion center.  Infuse over 46 hours at home. 3477 mg 2    pegfilgrastim (NEULASTA) 6 MG/0.6ML injection syringe Inject 0.6 mLs (6 mg total) into the skin once 0.6 mL 3    Meds Exist in Therapy Plan Peg-Filgrastim      heparin 100 UNIT/ML injection 5  mLs (500 Units total) by Intracatheter route as needed (  for line care) 30 Syringe 5    sodium chloride 0.9 % flush 10 mLs by Intracatheter route as needed (for line care) 600 mL 5     Allergies/Sensitivities:   Allergies as of 02/20/2017    (No Known Allergies (drug, envir, food or latex))     PHYSICAL EXAM:  Patient Vitals for the past 8 hrs:   BP Temp Temp src Pulse Resp SpO2   08/15/17 0744 138/60 36.8 C (98.2 F) TEMPORAL 101 16 97 %   08/15/17 0451 138/82 36.9 C (98.4 F) TEMPORAL 99 16 97 %     Wt Readings from Last 2 Encounters:   08/13/17 74.1 kg (163 lb 4.8 oz)   05/14/17 64.9 kg (143 lb)     I/O last 3 completed shifts:  06/12 0700 - 06/13 0659  In: 500 (6.8 mL/kg) [P.O.:370; I.V.:10 (0 mL/kg/hr); NG/GT:120]  Out: 150 (2 mL/kg) [Urine:150 (0.1 mL/kg/hr)]  Net: 350  Weight: 74.1 kg   General: NAD, non-toxic appearing  Skin: Warm and dry. No rashes.  HEENT: NCAT. Clear oropharynx.  Neck: No masses or thyromegaly.     Heart: RRR. S1 and S2.   Lungs: Normal respiratory effort. CTAB.  Abdomen: Hypoactive bowel sounds. Abdomen soft, tender by PEG site. PEG looks c/d/i. Nondistended  Musculoskeletal: No red, swollen, or tender joints.  Vascular: Warm extremities.  Neurologic: Alert. Answers appropriately for the most part. No focal deficits.     LAB DATA:    Recent Labs  Lab 08/15/17  0034 08/13/17  2355 08/13/17  0053 08/12/17  0025  08/08/17  2355   Sodium 140 142 141 140  < > 138   Potassium 3.9 4.1 4.3 4.1  < > 4.3   Chloride 101 101 99 100  < > 97   CO2 '26 28 25 ' 30*  < > 29*   UN '16 17 17 16  ' < > 13   Creatinine 0.72 0.81 0.84 0.88  < > 0.75   Glucose 107* 100* 135* 118*  < > 118*   Calcium 9.0 8.9 8.9 8.9  < > 9.2   Magnesium  --  2.0  --  1.8  --  1.9   < > = values in this interval not displayed.    No components found with this basename: PHOS, PREALB   Recent Labs  Lab 08/12/17  0025 08/08/17  2355 08/08/17  1647   INR 1.2* 1.2* 1.1   aPTT 35.2  --   --    Protime 13.3* 13.4* 13.0*       No  components found with this basename: APTT     Recent Labs  Lab 08/15/17  0034 08/13/17  2355 08/13/17  0053  08/10/17  0021  08/08/17  2355   Bilirubin,Total 0.4 0.5 0.4  < > 0.5  < > 0.4   Bilirubin,Direct  --   --   --   --  0.3  --  0.3   AST 55* 51* 54*  < > 57*  < > 64*   ALT 35 35 40*  < > 56*  < > 65*   Alk Phos 829* 819* 851*  < > 1,024*  < > 1,056*   Albumin 3.2* 3.2* 3.4*  < > 3.3*  < > 3.3*   < > = values in this interval not displayed.   Recent Labs  Lab 08/15/17  0034 08/13/17  2355 08/13/17  0053 08/12/17  0025   WBC 8.1 7.9  9.7 8.3   Hemoglobin 8.0* 8.1* 8.8* 7.9*   Hematocrit 25* 26* 29* 26*   Platelets 309 321 335 279   Sedimentation Rate  --   --   --  96*        IMAGING:  No results found.    ASSESSMENT/RECOMMENDATIONS:  Amanda Galloway is a 72 y.o. female with history of PE and pancreatic cancer s/p Whipple on 03/29/17. GI was initially consulted on 04/13/2017 for EGD for dilation of GOO. Our plan was to attempt EGD with dilation but the patient's course was complicated by septic shock requiring ICU stay with intubation, severe constipation, intraabdominal abscesses s/p IR drainage. A PEG was placed by IR on 06/14/17 and she was tolerating tube feeds. Her PEG was exchanged on 08/09/17 after it was leaking. She was nearing discharge to [facility] it seems, and her tube feeds were discontinued yesterday. She developed nausea and emesis today and we are consulted for consideration of EGD.     - will plan for EGD tomorrow   - Please keep NPO and turn off tube feeds if not done so already at midnight  - antiemetics per primary team    Laroy Apple, MD   PGY4, Gastroenterology Fellow    Patient was seen, evaluated, counseled in detail and examined by me.  The patient's chart was reviewed as were relevant images/labs.  My findings and assessment are outlined in edited detail above and were reviewed with the patient.    Nelly Laurence, MD  Associate Professor of Clinical Medicine  Attending Physician, Division  of Gastroenterology and Hepatology, [place] [address]  [phone]

## 2017-08-15 NOTE — Progress Notes (Signed)
Infectious Diseases Follow Up Note    Personally reviewed chart, labs, medications. Patient seen.     CC:  F/U s/p VRE bacteremia, liver and abdominal abscesses (VRE, enterobacter, C. glabrata and C. albicans, s/p Enterobacter cloacae bacteremia, and most recently E.coli bacteremia, Asp PNA.    Interval History:   Episode of emesis not preceded by nausea this morning    Subjective:  Amanda Galloway is discourage because of vomiting this am.  She is not eating today, "afraid will vomit"   She denies F/C, no nausea.  She denies urinary symptoms, she is moving her bowels daily (including today), no diarrhea. No SOB, cough.  Pain continues immediately around PEG, otherwise no abdominal tenderness.      ROS: relevant ROS as above.     Current Meds:  Scheduled Meds:   ondansetron  4 mg Intravenous Daily @ 2200    amoxicillin-clavulanate  1 tablet Oral 2 times per day    sodium chloride  1 g Per G Tube TID PC    lidocaine  1 patch Transdermal Q24H    pantoprazole  40 mg Oral BID AC    methadone  10 mg Oral TID    PROSOURCE NO CARB  30 mL Oral BID WC    pregabalin  75 mg Oral 2 times per day    lidocaine  1 patch Transdermal Q24H    furosemide  10 mg Oral BID    senna  2 tablet Oral 2 times per day    miconazole   Topical 2 times per day    guaiFENesin  600 mg Oral 2 times per day    metoclopramide  10 mg Oral TID AC    levothyroxine  150 mcg Oral Daily @ 0600    enoxaparin  40 mg Subcutaneous Daily @ 2100    polyethylene glycol  17 g Oral Daily    lactobacillus rhamnosus (GG)  1 capsule Oral Daily    bisacodyl  10 mg Rectal Daily    DULoxetine  20 mg Oral Q24H     Continuous Infusions:    PRN Meds:.   calcium carbonate  1,000 mg Oral BID PRN    HYDROmorphone PF  0.5 mg Intravenous Q1H PRN    Or    HYDROmorphone PF  1 mg Intravenous Q1H PRN    LORazepam  0.5 mg Oral Q6H PRN    heparin lock flush  50 Units Intracatheter PRN    ondansetron  4 mg Intravenous Q6H PRN     Objective:  BP: (102-138)/(60-82)    Temp:  [36.7 C (98.1 F)-36.9 C (98.4 F)]   Temp src: Temporal (06/13 0744)  Heart Rate:  [93-101]   Resp:  [16]   SpO2:  [93 %-97 %]     Physical Exam:              General Appearance: NAD but appears apprehensive, voices worry about emesis this am.   HEENT: MMM, clear oropharynx   Pulm: Crackles right base, diminished left base.   CV: RRR, no M/R/G. Bilateral LE edema (appears decreased) has TED hose on  Extremities: WWP  Abdomen:  Soft, NT/ND.  PEG intact with moderate erythema immediately around insertion site.  No Drainage.   Skin: warm/dry, no rashes    Neuro: Alert, oriented to place, person, appropriate in conversation  Lines: PICC LUE (4/11) site benign.       Recent Labs  Lab 08/15/17  0034 08/13/17  2355 08/13/17  0053  WBC 8.1 7.9 9.7   Hemoglobin 8.0* 8.1* 8.8*   Hematocrit 25* 26* 29*   Platelets 309 321 335   Seg Neut % 63.5 65.6 70.3   Lymphocyte % 14.5 15.5 14.4   Monocyte % 11.6 11.5 8.6   Eosinophil % 8.9 6.0 5.5       Recent Labs  Lab 08/15/17  0034 08/13/17  2355 08/13/17  0053   Sodium 140 142 141   Potassium 3.9 4.1 4.3   CO2 _0 UN _1 Creatinine 0.72 0.81 0.84   Glucose 107* 100* 135*   Calcium 9.0 8.9 8.9         Lab results: 08/15/17  0034 08/13/17  2355 08/13/17  0053 08/12/17  0025 08/11/17  0020   Total Protein 7.4 7.6 7.7 7.0 7.0   Albumin 3.2* 3.2* 3.4* 3.0* 3.0*   ALT 35 35 40* 42* 43*   AST 55* 51* 54* 52* 47*   Alk Phos 829* 819* 851* 853* 870*   Bilirubin,Total 0.4 0.5 0.4 0.4 0.4       Lab Results  Component Value Date/Time   CRP 46 (H) 08/12/2017 0025   CRP 48 (H) 08/05/2017 0045   CRP 63 (H) 07/29/2017 0019       Lab Results  Component Value Date/Time   Sedimentation Rate 96 (H) 08/12/2017 0025   Sedimentation Rate 101 (H) 08/05/2017 0045   Sedimentation Rate 39 (H) 07/29/2017 0019       Micro:   1/25: Intraoperative bile: GS 0 pmns, no organisms, Cx: no growth  2/12: 1 set of blood cultures from the right Mediport (no peripheral bld was sent): positive for  Enterobacter cloacae complex in 70 hrs, sensitive to zosyn.   2/18 abdominal GS had >25 PMNs, many GPC in pairs/chains, many GNB and cultures 4+ Enterobacter cloacae complex. The cultures also grew 1+ Candida glabrata   2/21: BCx from the Right peripheral IV, Mediport, L IJ:  No growth  05/03/17: IR-guided I&D with drain placement of the left upper quadrant collection: GS > 25 PMNs, many GNB and GN diplococci, Cultures with 4+ Enterobacter cloacae complex (resistant to Zosyn and CTX), C. Albicans and C. Glabrata, and Candida tropicalis  05/04/17:    1 set of blood cultures from left IJ: VRE at 15.3 TTP. Daptomycin sensitivities: MIC 44mg/ml (dose dependent),  Linezolid   1 set from periphery: VRE and Enterobacter cloacae at 14.8 TTP   05/05/17:  IR-guided I&D with drain placement of the intrahepatic collection: GS 1-10 PMNs, very few GPC in pairs. Fungal cultures with Candida glabrata  05/05/17: Blood cultures from periphery, Mediport and IJ: no growth  05/04/17: Urine cultures obtained (for unclear reason): no growth  05/08/17: Abscess GS: >25 PMNs, many GPC in chains, few GNB. Aerobic cultures growing 2+ Enterobacter (ertapenem, Cipro, TMP/SMX-sensitive) and 3+VRE, Fungal: C. Glabrata (sens to caspo, vori and dose dependent sens to fluconazole)  06/06/17: 2 sets of BCx (1 set each from periphery and Mediport) - taken after restarting ertapenem: IVAD grew VRE ttp 18.2 hours,  Periphery (Left arm) grew VRE ttp 17.3.  06/07/17 Pleural fluid (left) GS 1-10 PMN's, 1-10 Nucleated white cells, No organisms seen and 1604 nucleated cells. No growth on culture.   06/07/17 Abdominal abscess: labs cancelled - no specimens received.   06/08/17: Mediport BC + for VRE in 23 hrs (R-Amp, R- tetracycline per lab, suppressed result, R- doxycycline;  S-Linezolid, S-Dapto dose dependent),  Right  hand peripheral cx: No growth   06/10/17: Blood culture 2/2: 1 periphery (RHand), 1 from Mediport before removed: no growth  06/11/17: Blood Culture 2/2 (both  periphery) - no growth   06/19/17:  Perigastric fluid #1:  GS 10-25 PMNs -  GPB, GNB, GPC in pairs. 1+ C. Glabrata (R- fluconazole, voriconazole, S- Caspofungin); 2+ Enterococcus faecium (R- amp, PenG, Vanco; S- Linezolid)  06/19/17:  Liver fluid collection:  GS: 0 PMNs, Fungal and AFB stains negative, 1+ enterococcus faecium (R- Amp, PenG, Vanco, S- Dapto dose dependent, Linezolid)  06/19/17 : Perigastric fluid #2: GS: >25 PMNs, Gram negative bacilli; 3+ C. Glabrata.   07/04/17 U/A negative  07/04/17 Blood Cultures 3/3 (1 set PICC proximal port, 2 sets periphery): NGTD  07/04/17 MRSA amplification Nares: negative  07/05/17 Blood Culture 3/3 (2 PICC, 1 Periph) - NGTD  07/05/17 Liver Abscess (IR drain)- <1PMNs, GPC in prs and chains, 1 colony candida albicans (R- fluconazole, S-Caspo, vori)), 4+ Enterococcus faecium - two isolates with one now resistant to Daptomycin, the other dose dependent.   5/24: BCx 2 sets 1 peripheral 1 PICC. Peripheral grew E.coli after 13 hrs (Pansensitive).  5/24: UA no WBCS  5/26: BCx 2 sets 1 peripheral 1 PICC - no growth  5/27 Sputum Gram Stain >10 squamous epithealial cells <25 PMNs - not cultured/contaminated  5/29: BCx 2 sets (1 set periph, 1 set PICC): no growth  5/29 Sputum - not collected   5/29 U/A negative   5/29 Stool negative for C.diff  5/31 throat culture negative for StrepA  6/7 Urine: U/A neg  6/7 Blood cultures 2 sets (1 perph/1 PICC) No growth  6/13 Stool being sent  for H.pylori antigen      Imaging/Other Relevant Diagnostics:   No new imaging for review      Assessment and Plan:  ID problem(s):   - E.coli bacteremia/aspiration pneumonia (5/24).  - s/p VRE Bacteremia (3/2, 4/4, 4/6)   - s/p enterobacter bacteremia (2/12, 3/2)  - s/p Multiple Intra-abdominal abscesses growing VRE, enterobacter, C. glabrata, C.albicans    72 y.o.femalewith h/o PE and pancreatic cancer, s/p whipple procedure on 03/29/17 (goal of cure), prolonged complicated hospital course including multiple sepsis  episodes, malnutrition 2/2 poor po intake requiring TPN to PEG, multiple intraabdominal and hepatic abscesses and drains - abscesses have grown VRE, Enterobacter, Candida glabrata and albicans. Last of many abdominal drains was pulled on 07/22/17.  She had Enterobacter bacteremia on 2/12 and 3/2 and VRE bacteremia on 3/2, 4/4, 4/6 (last occurrences believedr/t seeded mediport which was removed).  She was on Linezolid for the VRE bacteremia since 5/2 until 6/10, (Daptomycin resistance developed in one isolate of VRE). On 5/24 she developed E.coli bactermia possibly related to aspiration PNA, treated with carbapenems, then narrowed to cefazolin and po flagyl on 5/28, however she developed fever and low grade leukocyctosis after only 2 days on Cefazolin/Flagy thus was broadened again to Pip/Tazo and Linezolid kept on board. She improved on the Zosyn x7 days, and was switched to Augmentin on 08/26/17 with plan to complete a 2 week course antibiotics for E.coli bacteremia, to finish on 6/7.  However, she  Again developed what appeared to be possibly be early sepsis episode on 6/7 during and post IR procedure to downsize PEG tube. The Zosyn was restarted, neuro was consulted and determined patient likely experienced acute toxic-metabolic encephalopathy, which has now resolved.  Blood cultures from 6/7 were sterile. She continues to improve clinically.  Her Linezolid was stopped  on 6/10 and on 6/12 Zosyn was deescalated to Augmentin.      She has been doing well until this morning when she had an episode of emesis not proceeded by nausea. This has been very disconcerting for her and now she is afraid to eat or move around much.  We discussed while uncomfortable, this is not a major set back and was encouraged to attempt to return to normal activities.  While she does have crackles in her right lung base, she has had diminished bases and bibasilar crackles on previous exams and has resolving small pleural effusions.  She was  encouraged to work with her flutter valve and incentive spirometer.   Per nursing, a stool sample was sent for H.pylori and patient will be NPO after midnight for an endoscopy tomorrow. We were going to recommend stopping Augmentin but given events of this morning, pending H.pylori, and upcoming endoscopy, will wait and continue to observe patient and depending on results of GI work up, plan to stop Augmentin in near future.       Recommendations:    Continue Augmentin   Follow fever curve, white count   Follow results of stool testing   Follow endoscopy results   ID team 3 will continue to follow along     Amanda Galloway, ANP-BC  Infectious Diseases, Team 3  Pager [phone]  Office Ph [phone]

## 2017-08-15 NOTE — Plan of Care (Signed)
Problem: Safety  Goal: Patient will remain free of falls  Outcome: Maintaining      Problem: Pain/Comfort  Goal: Patient's pain or discomfort is manageable  Outcome: Maintaining      Problem: Mobility  Goal: Functional status is maintained or improved - Geriatric  Outcome: Maintaining      Problem: Nutrition  Goal: Patient's nutritional status is maintained or improved  Outcome: Maintaining      Problem: Cognitive function  Goal: Cognitive function will be maintained  Outcome: Maintaining      Problem: Bowel Elimination  Goal: Elimination patterns are normal or improving  Outcome: Maintaining      Problem: Post-Operative Hemodynamic Stability  Goal: Maintain Hemodynamic Stability  Outcome: Maintaining      Problem: Post-Operative Complications  Goal: Prevent post-operative complications  Outcome: Maintaining      Problem: Post-Operative Bowel Elimination  Goal: Elimination pattern is normal or improving  Outcome: Maintaining      Problem: Psychosocial  Goal: Demonstrates ability to cope with illness  Outcome: Maintaining      Problem: Fluid and Electrolyte Imbalance  Goal: Fluid and Electrolyte imbalance  Outcome: Maintaining      Problem: GI Bleeding Elimination  Goal: Elimination of patterns are normal or improving  Outcome: Maintaining      Problem: Post-Operative Bladder Elimination  Goal: Patient is able to empty bladder or return to baseline  Outcome: Maintaining

## 2017-08-16 ENCOUNTER — Inpatient Hospital Stay: Payer: Medicare (Managed Care) | Admitting: Gastroenterology

## 2017-08-16 LAB — COMPREHENSIVE METABOLIC PANEL
ALT: 32 U/L (ref 0–35)
AST: 53 U/L — ABNORMAL HIGH (ref 0–35)
Albumin: 3.4 g/dL — ABNORMAL LOW (ref 3.5–5.2)
Alk Phos: 839 U/L — ABNORMAL HIGH (ref 35–105)
Anion Gap: 14 (ref 7–16)
Bilirubin,Total: 0.5 mg/dL (ref 0.0–1.2)
CO2: 25 mmol/L (ref 20–28)
Calcium: 9 mg/dL (ref 8.6–10.2)
Chloride: 97 mmol/L (ref 96–108)
Creatinine: 0.71 mg/dL (ref 0.51–0.95)
GFR,Black: 99 *
GFR,Caucasian: 86 *
Glucose: 104 mg/dL — ABNORMAL HIGH (ref 60–99)
Lab: 12 mg/dL (ref 6–20)
Potassium: 4.3 mmol/L (ref 3.3–5.1)
Sodium: 136 mmol/L (ref 133–145)
Total Protein: 7.6 g/dL (ref 6.3–7.7)

## 2017-08-16 LAB — CBC AND DIFFERENTIAL
Baso # K/uL: 0.1 10*3/uL (ref 0.0–0.1)
Basophil %: 0.6 %
Eos # K/uL: 0.4 10*3/uL (ref 0.0–0.4)
Eosinophil %: 3.1 %
Hematocrit: 26 % — ABNORMAL LOW (ref 34–45)
Hemoglobin: 8 g/dL — ABNORMAL LOW (ref 11.2–15.7)
IMM Granulocytes #: 0.2 10*3/uL
IMM Granulocytes: 1.2 %
Lymph # K/uL: 1.2 10*3/uL (ref 1.2–3.7)
Lymphocyte %: 9.7 %
MCH: 30 pg/cell (ref 26–32)
MCHC: 31 g/dL — ABNORMAL LOW (ref 32–36)
MCV: 96 fL — ABNORMAL HIGH (ref 79–95)
Mono # K/uL: 1.7 10*3/uL — ABNORMAL HIGH (ref 0.2–0.9)
Monocyte %: 13.4 %
Neut # K/uL: 9 10*3/uL — ABNORMAL HIGH (ref 1.6–6.1)
Nucl RBC # K/uL: 0 10*3/uL (ref 0.0–0.0)
Nucl RBC %: 0 /100 WBC (ref 0.0–0.2)
Platelets: 316 10*3/uL (ref 160–370)
RBC: 2.7 MIL/uL — ABNORMAL LOW (ref 3.9–5.2)
RDW: 20.3 % — ABNORMAL HIGH (ref 11.7–14.4)
Seg Neut %: 72 %
WBC: 12.5 10*3/uL — ABNORMAL HIGH (ref 4.0–10.0)

## 2017-08-16 LAB — PHOSPHORUS: Phosphorus: 4.1 mg/dL (ref 2.7–4.5)

## 2017-08-16 LAB — MAGNESIUM: Magnesium: 1.8 mg/dL (ref 1.6–2.5)

## 2017-08-16 MED ORDER — SODIUM CHLORIDE 0.9 % FLUSH FOR PUMPS *I*
0.0000 mL/h | INTRAVENOUS | Status: DC | PRN
Start: 2017-08-16 — End: 2017-08-24

## 2017-08-16 MED ORDER — FLUCONAZOLE 200 MG PO TABS *I*
100.0000 mg | ORAL_TABLET | Freq: Every day | ORAL | Status: DC
Start: 2017-08-16 — End: 2017-08-16
  Administered 2017-08-16: 100 mg via ORAL
  Filled 2017-08-16 (×3): qty 1

## 2017-08-16 MED ORDER — CASPOFUNGIN 50 MG IN 110 ML NS MINI-BAG (0.5 MG/ML) *I*
50.0000 mg | INTRAVENOUS | Status: DC
Start: 2017-08-17 — End: 2017-08-22
  Administered 2017-08-17 – 2017-08-21 (×5): 50 mg via INTRAVENOUS
  Filled 2017-08-16 (×5): qty 100

## 2017-08-16 MED ORDER — CLARITHROMYCIN 500 MG PO TABS *I*
500.0000 mg | ORAL_TABLET | Freq: Two times a day (BID) | ORAL | Status: DC
Start: 2017-08-16 — End: 2017-08-16

## 2017-08-16 MED ORDER — HALOPERIDOL LACTATE 5 MG/ML IJ SOLN *I*
INTRAMUSCULAR | Status: AC
Start: 2017-08-16 — End: 2017-08-16
  Administered 2017-08-16: 0.5 mg via INTRAVENOUS
  Filled 2017-08-16: qty 1

## 2017-08-16 MED ORDER — DEXTROSE 5 % FLUSH FOR PUMPS *I*
0.0000 mL/h | INTRAVENOUS | Status: DC | PRN
Start: 2017-08-16 — End: 2017-08-24

## 2017-08-16 MED ORDER — SODIUM CHLORIDE 0.9 % IV SOLN WRAPPED *I*
70.0000 mg | Freq: Once | INTRAVENOUS | Status: AC
Start: 2017-08-16 — End: 2017-08-16
  Administered 2017-08-16: 70 mg via INTRAVENOUS
  Filled 2017-08-16: qty 10

## 2017-08-16 MED ORDER — FENTANYL CITRATE 50 MCG/ML IJ SOLN *WRAPPED*
INTRAMUSCULAR | Status: AC | PRN
Start: 2017-08-16 — End: 2017-08-16
  Administered 2017-08-16 (×2): 25 ug via INTRAVENOUS

## 2017-08-16 MED ORDER — METHADONE HCL 10 MG PO TABS *I*
10.0000 mg | ORAL_TABLET | Freq: Three times a day (TID) | ORAL | Status: DC
Start: 2017-08-16 — End: 2017-08-24
  Administered 2017-08-16 – 2017-08-24 (×24): 10 mg via ORAL
  Filled 2017-08-16 (×25): qty 1

## 2017-08-16 MED ORDER — HALOPERIDOL LACTATE 5 MG/ML IJ SOLN *I*
0.5000 mg | Freq: Once | INTRAMUSCULAR | Status: AC
Start: 2017-08-16 — End: 2017-08-16

## 2017-08-16 MED ORDER — NALOXONE HCL 0.4 MG/ML IJ SOLN *WRAPPED*
0.1000 mg | Status: DC | PRN
Start: 2017-08-16 — End: 2017-08-24

## 2017-08-16 MED ORDER — AMOXICILLIN 500 MG PO CAPS *I*
1000.0000 mg | ORAL_CAPSULE | Freq: Two times a day (BID) | ORAL | Status: DC
Start: 2017-08-16 — End: 2017-08-20
  Administered 2017-08-16 – 2017-08-20 (×8): 1000 mg via ORAL
  Filled 2017-08-16 (×12): qty 2

## 2017-08-16 MED ORDER — MIDAZOLAM HCL 5 MG/5ML IJ SOLN *I*
INTRAMUSCULAR | Status: AC | PRN
Start: 2017-08-16 — End: 2017-08-16
  Administered 2017-08-16 (×4): 2 mg via INTRAVENOUS

## 2017-08-16 MED ORDER — TETRACYCLINE HCL 500 MG PO CAPS *I*
500.0000 mg | ORAL_CAPSULE | Freq: Four times a day (QID) | ORAL | Status: DC
Start: 2017-08-16 — End: 2017-08-24
  Administered 2017-08-16 – 2017-08-24 (×31): 500 mg via ORAL
  Filled 2017-08-16 (×35): qty 1

## 2017-08-16 MED ORDER — HYDROMORPHONE HCL 4 MG PO TABS *I*
8.0000 mg | ORAL_TABLET | ORAL | Status: DC | PRN
Start: 2017-08-16 — End: 2017-08-21
  Administered 2017-08-16 (×2): 8 mg via ORAL
  Filled 2017-08-16 (×2): qty 2

## 2017-08-16 MED ORDER — MAGNESIUM SULFATE 2 GM IN 50 ML *WRAPPED*
2000.0000 mg | Freq: Once | INTRAVENOUS | Status: AC
Start: 2017-08-16 — End: 2017-08-16
  Administered 2017-08-16: 2000 mg via INTRAVENOUS
  Filled 2017-08-16: qty 50

## 2017-08-16 MED ORDER — DEXAMETHASONE SODIUM PHOSPHATE 4 MG/ML INJ SOLN *WRAPPED*
4.0000 mg | Freq: Once | INTRAMUSCULAR | Status: AC
Start: 2017-08-17 — End: 2017-08-17
  Administered 2017-08-17: 4 mg via INTRAVENOUS
  Filled 2017-08-16: qty 1

## 2017-08-16 MED ORDER — CASPOFUNGIN 50 MG IN 110 ML NS MINI-BAG (0.5 MG/ML) *I*
50.0000 mg | INTRAVENOUS | Status: DC
Start: 2017-08-17 — End: 2017-08-16

## 2017-08-16 MED ORDER — FENTANYL CITRATE 50 MCG/ML IJ SOLN *WRAPPED*
INTRAMUSCULAR | Status: AC
Start: 2017-08-16 — End: 2017-08-16
  Filled 2017-08-16: qty 4

## 2017-08-16 MED ORDER — MIDAZOLAM HCL 5 MG/5ML IJ SOLN *I*
INTRAMUSCULAR | Status: AC
Start: 2017-08-16 — End: 2017-08-16
  Filled 2017-08-16: qty 10

## 2017-08-16 MED ORDER — PLASMA-LYTE IV SOLN *WRAPPED*
100.0000 mL/h | Status: DC
Start: 2017-08-16 — End: 2017-08-17
  Administered 2017-08-16 (×2): 100 mL/h via INTRAVENOUS
  Administered 2017-08-16 – 2017-08-17 (×4): 100 mL/h
  Administered 2017-08-17: 100 mL/h via INTRAVENOUS
  Administered 2017-08-17: 100 mL/h

## 2017-08-16 NOTE — Preop H&P (Signed)
Day of procedure update in an inpatient, with no significant changes since consultation, see inpatient GI notes. OK to proceed with interventions.  Virlee Stroschein E. Dishon Kehoe, MD  Associate Professor of Clinical Medicine  Attending Physician, Division of Gastroenterology and Hepatology, [place]

## 2017-08-16 NOTE — Progress Notes (Addendum)
Surgical Oncology Surgery Progress Note    Patient: Amanda Galloway    LOS: 140 days    Attending: Weir. AFVSS. WBC 12.5 <--8.1. Minimal PO intake yesterday 2/2 continued nausea. Stool antigen test + for H. Pylori. Plan for EGD today.  Denies f/c, n/v, CP or SOB.      OBJECTIVE  Physical Exam:  Temp:  [36.2 C (97.2 F)-36.9 C (98.4 F)] 36.9 C (98.4 F)  Heart Rate:  [101-116] 105  Resp:  [14-16] 16  BP: (100-138)/(60-70) 120/64     GEN: no apparent distress  HEENT: NCAT, face symmetric  CHEST: nonlabored respirations  ABD: soft, mildly distended, tender surrounding G-tube that is CDI, minimal erythema, without active leaking   NEURO/MOTOR: alert, appropriate  EXTREMITIES: atraumatic  VASCULAR: feet wwp, no edema    Intake/Output:  06/13 0700 - 06/14 0659  In: 1313.1 [P.O.:340; I.V.:877.6]  Out: 350 [Urine:350]     Medications:  Current Facility-Administered Medications   Medication    ondansetron (ZOFRAN) injection 4 mg    electrolyte-148 (PLASMALYTE) solution    amoxicillin-clavulanate (AUGMENTIN) 875-125 MG per tablet 1 tablet    sodium chloride tablet 1 g    sodium chloride 0.9 % FLUSH REQUIRED IF PATIENT HAS IV    dextrose 5 % FLUSH REQUIRED IF PATIENT HAS IV    HYDROmorphone (DILAUDID) injection 0.5 mg    Or    HYDROmorphone (DILAUDID) injection 1 mg    lidocaine (LIDODERM) 5 % patch 1 patch    naloxone (NARCAN) 0.4 mg/mL injection 0.1 mg    pantoprazole (PROTONIX) EC tablet 40 mg    methadone (DOLOPHINE) tablet 10 mg    PROSOURCE NO CARB liquid LIQD 30 mL    pregabalin (LYRICA) capsule 75 mg    lidocaine (LIDODERM) 5 % patch 1 patch    furosemide (LASIX) tablet 10 mg    senna (SENOKOT) tablet 2 tablet    promethazine (PHENERGAN) 12.5 mg in sodium chloride 0.9% 25 mL IVPB    miconazole (MICATIN) 2 % powder    ondansetron (ZOFRAN) injection 4 mg    guaiFENesin (MUCINEX) 12 hr tablet 600 mg    metoclopramide (REGLAN) tablet 10 mg     levothyroxine (SYNTHROID, LEVOTHROID) tablet 150 mcg    enoxaparin (LOVENOX) injection 40 mg    albuterol (PROVENTIL) nebulization 2.5 mg    polyethylene glycol (GLYCOLAX,MIRALAX) powder 17 g    lactobacillus rhamnosus (GG) (CULTURELLE) capsule 1 each    methyl salicylate-menthol (BENGAY) topical cream    bisacodyl (DULCOLAX) suppository 10 mg    DULoxetine (CYMBALTA) DR capsule 20 mg    mineral oil-hydrophilic petrolatum (AQUAPHOR) ointment    sodium chloride 0.9 % flush 10 mL    sodium chloride 0.9 % flush 10 mL     Facility-Administered Medications Ordered in Other Encounters   Medication    etomidate (AMIDATE) 2 mg/mL injection    rocuronium (ZEMURON) 10 mg/mL injection         Laboratory values:   Recent Labs      08/15/17   2352  08/15/17   0034   WBC  12.5*  8.1   Hemoglobin  8.0*  8.0*   Hematocrit  26*  25*   Platelets  316  309     No components found with this basename: APTT, PT Recent Labs      08/15/17   2352  08/15/17   0034  08/13/17   2355  Sodium  136  140  142   Potassium  4.3  3.9  4.1   Chloride  97  101  101   CO2  '25  26  28   ' UN  '12  16  17   ' Creatinine  0.71  0.72  0.81   Glucose  104*  107*  100*   Calcium  9.0  9.0  8.9   Magnesium  1.8   --   2.0   Phosphorus  4.1   --   4.3    Recent Labs      08/15/17   2352  08/15/17   0034   AST  53*  55*   ALT  32  35   Alk Phos  839*  829*   Bilirubin,Total  0.5  0.4     Recent Labs      08/15/17   2352  08/15/17   0034   Total Protein  7.6  7.4   Albumin  3.4*  3.2*     No results for input(s): AMY, LIP in the last 72 hours.   GLUCOSE:   No results for input(s): PGLU in the last 72 hours.  Imaging: No results found.     ASSESSMENT  Amanda Galloway is a 72 y.o. female with h/o recent PE and pancreatic cancer POD # 102  status post whipple procedure complicated by delayed gastric emptying.  NGT replaced on 04/04/17. UGI on 04/12/17 concerning for obstruction just beyond the [place] in the efferent limb.  Course complicated on 6/60/60 by rising LFTs  and sepsis with CT concerning for cholangitis given biliary tree obstruction and PV compression due to hematoma and afferent limb distention in setting of PE treatment. Is s/p IR PTC on 04/16/17 and IR percutaneous aspiration of anterior abdominal fluid collection on 04/24/17. IR LUQ perc drain placement 3/1. IR anterior abdominal 74f perc drain placement 3/3. IR anterior perc drain into left hepatic collection 3/6. Developed E. Coli bacteremia on 07/26/17 presumed secondary to aspiration pneumonia.    PLAN  Continue Augmentin   EGD today   NPO for EGD, resume Regular diet following procedure, calorie counts  C/w aggressive pulmonary toilet, OOB/ambulate, PT/OT  Bowel regimen   Analgesia with PO pain. Appreciate palliative care assistance   DVT ppx - Lovenox   Dispo: [id number]   Author: MMelburn Hake MD as of: 08/16/2017  at: 6:49 AM       I saw and evaluated the patient. I agree with the resident's/fellow's findings and plan of care as documented above.    EDelano Metz MD

## 2017-08-16 NOTE — Progress Notes (Signed)
Off floor this morning via stretcher to GI, report abdomen peg tube site pain level 7-8/10- Dilaudid po given with some relief. Ambulate around unit using front wheel walker with modified assist-tolerated well, will continue to monitor.

## 2017-08-16 NOTE — Continuity of Care (Signed)
[place] of Quality and Surveillance for Nursing Homes and ICFs/MR  Patient Name  Amanda Galloway MR Number  [id number] Account Number   [facility]                Patient Review Instrument  (HC-PRI)               RUG II:  CA3     I. ADMINISTRATIVE DATA     1. Operating Certificate Number  [id number] 2. Social Security Number  [id number]   3. Official Name of Hospital Completing this Review  [facility]    4A. Patient Name  Amanda Galloway 10. Sex  female   4B. [place] 11A. Date of Hospital Admission or Initial Agency Visit  03/29/2017   5. Date of [facility] Completion  August 16, 2017 11B. Date of Alternate level of Care Status in Hospital (if applicable)    6. Account Number   12. Medicaid Number     7. Hospital Room Number  YQM5-78/ION6-2952 84. Medicare Number  [id number]   5. Name of Unit/Division/Building  [id number] 14. Primary Payor  [employer] MEDICARE   9. Date of Birth  [id number] 7. Reason for [facility] Completion  1 - RHCF application from hospital     II. MEDICAL EVENTS     16. Decubitus Level:        Location:  No reddened skin or breakdown (blanchable redness on sacrum)     17. MEDICAL CONDITIONS During the past week:  1=Yes  2=No 18. MEDICAL TREATMENTS 1=Yes  2=No   A. Comatose No A. Tracheostomy Care/Suct No         B. Dehydration No B. Suctioning/General No         C. Internal Bleeding No C. Oxygen (Daily) No              D. Stasis Ulcer No D Respiratory Care (Daily) Yes (IS and flutter valve every 4 hours w/a.)         E. Terminally Ill No E. Nasal Gastric Feeding No (TF stopped 6/12)         F. Contractures No F. Parenteral Feeding No         G. Diabetes Mellitus No G. Wound Care Yes (barrier cream to bottom, PEG tube site care daily)         H. Urinary Tract Infection No H. Chemotherapy No         I. HIV Infection Symptomatic No I. Transfusion No         J. Accident No J. Dialysis No         K. Ventilator Dependent No K.  Bowel and Bladder Rehab No           L. Catheter (Indwelling or External) No              M. Physical Restraints  (Daytime) No             Adapted from [id number]    Version: 002F  Review Type: Update review  08/16/2017  2 of [place] of Quality and Surveillance for Nursing Homes and ICFs/MR  Patient Name  Amanda Galloway MR Number  [id number] Account Number   [place]  Patient Review Instrument  (HC-PRI)               III. Activities of Daily Living     19. Eating 2-Requires intermittent supervision (that is, verbal encouragement/guidance) and/or minimal physical assistance with minor parts of eating, such as cutting food, buttering bread or opening milk carton.    20. Mobility 3-Walks with constant one-to-one supervision and/or constant physical assistance.    21. Transfer 2-Requires intermittent supervision (that is, verbal cueing, guidance) and/or physical assistance for difficult maneuvers only.    22. Toileting 2-Requires intermittent supervision or minor physical assistance (for example, clothes adjustment or washing hands).      IV. BEHAVIORS     23. Verbal Disruption No known history   24. Physical Agression No known history   25. Disruptive, Infantile or Socially Inappropriate Behavior No known history   26. Hallucinations No     V. SPECIALIZED SERVICES     27A. Physical Therapy  27B. Occupational Therapy    Level: Restorative therapy- requires and is currently receiving physical and/or occupational therapy for the past week Level: Does not receive.   Actual days/week:1 No - less than 5 times/week Actual days/week:0 No - less than 5 times/wk   Actual hours/week:26 minutes No - less than 2.5 hrs/wk Actual hours/week:0 No - less than 2.5 hrs/wk     28. Number of Physician Visits - 0     VI. DIAGNOSIS     29. Primary Problem: Malignant neoplasm of pancreas, unspecified location of malignancy     VII. PLAN OF CARE SUMMARY      30. Primary Diagnosis: Encounter Diagnoses   Name Primary?    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   31A. Rehabilitation Potential: Remains appropriate for skilled therapy            Comment:    31B. Current therapy care plan: Treatment Interventions: Restorative PT  PT Frequency: 2-4x/wk  Mobility Recommendations: 1A with RW, please assist with ambulation out of room 3x/day  Referral Recommendations: OT  Discharge Recommendations: [place]  PT Discharge Equipment Recommended: To be determined  Assessment/Recommendations Reviewed With:: Patient, Nursing, Social Worker  Next PT Visit: progress all mobility; higher level balance with ambulation, dual task.   OT Frequency: 2-3x/wk  Patient Will Benefit From: ADL retraining, Functional transfer training, UE strengthening/ROM, Endurance training, Cognitive re-education, Patient/Family training, Compensatory technique education, IADL training, Exercises.   OT Discharge Recommendations: [place]  OT Discharge Equipment Recommended:  (TBD at rehab)  Additional Comments: Currently, pt requires extensive assist with basic adl tasks and functional transfers and is functioning below baseline of independence.  Also presents with fatigue and generalized weakness.  Recommend SNF rehab prior to returning home to maximize level of independence.   Hospital Stay Recommendations: Encourage  participation in adls and to sit up in chair to improve overall strength. Marland Kitchen  Comment:    32. Medications: See below   33A. Allergies: Patient has no known allergies (drug, envir, food or latex).   33B: Treatments:    33C: Abnormal Labs: See below   33D: Precautions:  falls           Comment:    33E: Pacemaker  no   45F: Diet: Dietary modular: Pro[employer] NoCarb Liquid Protein 30 mL  Dietary nutrition supplements adult:  Diet dysphagia 3 (dental soft)          34. Race/Ethnic Group White or Caucasian [1]     44. Certification    I have personally observed/interviewed this patient and completed this H/C PRI. No - Staff/Chart           I certify that the information contained herein is a true abstract of this patient's condition and medical record. Yes   Certified Assessor: Danella Sensing, RN   Identification Number: [id number]             Adapted from [id number]    Version: 002F  Review Type: Update review  08/16/2017  2 of 1    PMH:    Past Medical History:   Diagnosis Date    Acute kidney failure 03/31/2017    Cancer     Depression     Fibromyalgia     Hypothyroidism        Medications:    Current Facility-Administered Medications   Medication Dose Route Frequency    HYDROmorphone (DILAUDID) tablet 8 mg  8 mg Oral Q2H PRN    sodium chloride 0.9 % FLUSH REQUIRED IF PATIENT HAS IV  0-500 mL/hr Intravenous PRN    dextrose 5 % FLUSH REQUIRED IF PATIENT HAS IV  0-500 mL/hr Intravenous PRN    electrolyte-148 (PLASMALYTE) solution  100 mL/hr Intravenous Continuous    methadone (DOLOPHINE) tablet 10 mg  10 mg Oral TID    caspofungin (CANCIDAS) 70 mg in sodium chloride 0.9 % 285 mL IVPB  70 mg Intravenous Once    [START ON 08/17/2017] caspofungin (CANCIDAS) 50 mg in sodium chloride 0.9% 100 mL mini-bag  50 mg Intravenous Q24H    ondansetron (ZOFRAN) injection 4 mg  4 mg Intravenous Daily @ 2200    electrolyte-148 (PLASMALYTE) solution  75 mL/hr Intravenous Continuous    amoxicillin-clavulanate (AUGMENTIN) 875-125  MG per tablet 1 tablet  1 tablet Oral 2 times per day    sodium chloride tablet 1 g  1 g Per G Tube TID PC    sodium chloride 0.9 % FLUSH REQUIRED IF PATIENT HAS IV  0-500 mL/hr Intravenous PRN    dextrose 5 % FLUSH REQUIRED IF PATIENT HAS IV  0-500 mL/hr Intravenous PRN    HYDROmorphone (DILAUDID) injection 0.5 mg  0.5 mg Intravenous Q2H PRN    Or    HYDROmorphone (DILAUDID) injection 1 mg  1 mg Intravenous Q2H PRN    lidocaine (LIDODERM) 5 % patch 1 patch  1 patch Transdermal Q24H    pantoprazole (PROTONIX) EC tablet 40 mg  40 mg Oral BID AC    methadone (DOLOPHINE) tablet 10 mg  10 mg Oral TID    PROSOURCE NO CARB liquid LIQD 30 mL  30 mL Oral BID WC    pregabalin (LYRICA) capsule 75 mg  75 mg Oral 2 times per day    lidocaine (LIDODERM) 5 % patch 1 patch  1 patch Transdermal Q24H    furosemide (LASIX) tablet 10 mg  10 mg Oral BID  senna (SENOKOT) tablet 2 tablet  2 tablet Oral 2 times per day    promethazine (PHENERGAN) 12.5 mg in sodium chloride 0.9% 25 mL IVPB  12.5 mg Intravenous Q6H PRN    miconazole (MICATIN) 2 % powder   Topical 2 times per day    ondansetron (ZOFRAN) injection 4 mg  4 mg Intravenous Q6H PRN    guaiFENesin (MUCINEX) 12 hr tablet 600 mg  600 mg Oral 2 times per day    metoclopramide (REGLAN) tablet 10 mg  10 mg Oral TID AC    levothyroxine (SYNTHROID, LEVOTHROID) tablet 150 mcg  150 mcg Oral Daily @ 0600    enoxaparin (LOVENOX) injection 40 mg  40 mg Subcutaneous Daily @ 2100    albuterol (PROVENTIL) nebulization 2.5 mg  2.5 mg Nebulization Q6H PRN    polyethylene glycol (GLYCOLAX,MIRALAX) powder 17 g  17 g Oral Daily    lactobacillus rhamnosus (GG) (CULTURELLE) capsule 1 each  1 capsule Oral Daily    methyl salicylate-menthol (BENGAY) topical cream   Apply externally 4x Daily PRN    bisacodyl (DULCOLAX) suppository 10 mg  10 mg Rectal Daily    DULoxetine (CYMBALTA) DR capsule 20 mg  20 mg Oral Q24H    mineral oil-hydrophilic petrolatum (AQUAPHOR) ointment    Topical PRN    sodium chloride 0.9 % flush 10 mL  10 mL Intracatheter Q8H PRN    sodium chloride 0.9 % flush 10 mL  10 mL Intracatheter PRN     Facility-Administered Medications Ordered in Other Encounters   Medication Dose Route Frequency    etomidate (AMIDATE) 2 mg/mL injection    PRN    rocuronium (ZEMURON) 10 mg/mL injection   Intravenous PRN       Abnormal Labs:    Recent Results (from the past 72 hour(s))   Magnesium    Collection Time: 08/13/17 11:55 PM   Result Value Ref Range    Magnesium 2.0 1.6 - 2.5 mg/dL   Phosphorus    Collection Time: 08/13/17 11:55 PM   Result Value Ref Range    Phosphorus 4.3 2.7 - 4.5 mg/dL   Comprehensive metabolic panel    Collection Time: 08/13/17 11:55 PM   Result Value Ref Range    Sodium 142 133 - 145 mmol/L    Potassium 4.1 3.3 - 5.1 mmol/L    Chloride 101 96 - 108 mmol/L    CO2 28 20 - 28 mmol/L    Anion Gap 13 7 - 16    UN 17 6 - 20 mg/dL    Creatinine 0.81 0.51 - 0.95 mg/dL    GFR,Caucasian 73 *    GFR,Black 84 *    Glucose 100 (H) 60 - 99 mg/dL    Calcium 8.9 8.6 - 10.2 mg/dL    Total Protein 7.6 6.3 - 7.7 g/dL    Albumin 3.2 (L) 3.5 - 5.2 g/dL    Bilirubin,Total 0.5 0.0 - 1.2 mg/dL    AST 51 (H) 0 - 35 U/L    ALT 35 0 - 35 U/L    Alk Phos 819 (H) 35 - 105 U/L   CBC and differential    Collection Time: 08/13/17 11:55 PM   Result Value Ref Range    WBC 7.9 4.0 - 10.0 THOU/uL    RBC 2.7 (L) 3.9 - 5.2 MIL/uL    Hemoglobin 8.1 (L) 11.2 - 15.7 g/dL    Hematocrit 26 (L) 34 - 45 %    MCV 98 (H)  79 - 95 fL    MCH 30 26 - 32 pg/cell    MCHC 31 (L) 32 - 36 g/dL    RDW 20.7 (H) 11.7 - 14.4 %    Platelets 321 160 - 370 THOU/uL    Seg Neut % 65.6 %    Lymphocyte % 15.5 %    Monocyte % 11.5 %    Eosinophil % 6.0 %    Basophil % 1.0 %    Neut # K/uL 5.2 1.6 - 6.1 THOU/uL    Lymph # K/uL 1.2 1.2 - 3.7 THOU/uL    Mono # K/uL 0.9 0.2 - 0.9 THOU/uL    Eos # K/uL 0.5 (H) 0.0 - 0.4 THOU/uL    Baso # K/uL 0.1 0.0 - 0.1 THOU/uL    Nucl RBC % 0.0 0.0 - 0.2 /100 WBC    Nucl RBC # K/uL 0.0  0.0 - 0.0 THOU/uL    IMM Granulocytes # 0.0 THOU/uL    IMM Granulocytes 0.4 %   Comprehensive metabolic panel    Collection Time: 08/15/17 12:34 AM   Result Value Ref Range    Sodium 140 133 - 145 mmol/L    Potassium 3.9 3.3 - 5.1 mmol/L    Chloride 101 96 - 108 mmol/L    CO2 26 20 - 28 mmol/L    Anion Gap 13 7 - 16    UN 16 6 - 20 mg/dL    Creatinine 0.72 0.51 - 0.95 mg/dL    GFR,Caucasian 84 *    GFR,Black 97 *    Glucose 107 (H) 60 - 99 mg/dL    Calcium 9.0 8.6 - 10.2 mg/dL    Total Protein 7.4 6.3 - 7.7 g/dL    Albumin 3.2 (L) 3.5 - 5.2 g/dL    Bilirubin,Total 0.4 0.0 - 1.2 mg/dL    AST 55 (H) 0 - 35 U/L    ALT 35 0 - 35 U/L    Alk Phos 829 (H) 35 - 105 U/L   CBC and differential    Collection Time: 08/15/17 12:34 AM   Result Value Ref Range    WBC 8.1 4.0 - 10.0 THOU/uL    RBC 2.6 (L) 3.9 - 5.2 MIL/uL    Hemoglobin 8.0 (L) 11.2 - 15.7 g/dL    Hematocrit 25 (L) 34 - 45 %    MCV 96 (H) 79 - 95 fL    MCH 31 26 - 32 pg/cell    MCHC 32 32 - 36 g/dL    RDW 20.1 (H) 11.7 - 14.4 %    Platelets 309 160 - 370 THOU/uL    Seg Neut % 63.5 %    Lymphocyte % 14.5 %    Monocyte % 11.6 %    Eosinophil % 8.9 %    Basophil % 1.0 %    Neut # K/uL 5.1 1.6 - 6.1 THOU/uL    Lymph # K/uL 1.2 1.2 - 3.7 THOU/uL    Mono # K/uL 0.9 0.2 - 0.9 THOU/uL    Eos # K/uL 0.7 (H) 0.0 - 0.4 THOU/uL    Baso # K/uL 0.1 0.0 - 0.1 THOU/uL    Nucl RBC % 0.0 0.0 - 0.2 /100 WBC    Nucl RBC # K/uL 0.0 0.0 - 0.0 THOU/uL    IMM Granulocytes # 0.0 THOU/uL    IMM Granulocytes 0.5 %   H. pylori antigen, stool    Collection Time: 08/15/17 10:13 AM   Result Value  Ref Range    H. pylori Stool Antigen EIA Positive for Helicobacter pylori Antigen (!)    Magnesium    Collection Time: 08/15/17 11:52 PM   Result Value Ref Range    Magnesium 1.8 1.6 - 2.5 mg/dL   Phosphorus    Collection Time: 08/15/17 11:52 PM   Result Value Ref Range    Phosphorus 4.1 2.7 - 4.5 mg/dL   Comprehensive metabolic panel    Collection Time: 08/15/17 11:52 PM   Result Value Ref Range     Sodium 136 133 - 145 mmol/L    Potassium 4.3 3.3 - 5.1 mmol/L    Chloride 97 96 - 108 mmol/L    CO2 25 20 - 28 mmol/L    Anion Gap 14 7 - 16    UN 12 6 - 20 mg/dL    Creatinine 0.71 0.51 - 0.95 mg/dL    GFR,Caucasian 86 *    GFR,Black 99 *    Glucose 104 (H) 60 - 99 mg/dL    Calcium 9.0 8.6 - 10.2 mg/dL    Total Protein 7.6 6.3 - 7.7 g/dL    Albumin 3.4 (L) 3.5 - 5.2 g/dL    Bilirubin,Total 0.5 0.0 - 1.2 mg/dL    AST 53 (H) 0 - 35 U/L    ALT 32 0 - 35 U/L    Alk Phos 839 (H) 35 - 105 U/L     *Note: Due to a large number of results and/or encounters for the requested time period, some results have not been displayed. A complete set of results can be found in Results Review.

## 2017-08-16 NOTE — Progress Notes (Signed)
Brief Infectious Diseases Note    Interval Hx: candida esophagitis    ID team has been following this patient with s/p pancreatic CA, Whipple on 03/29/17 for cure, followed by a prolonged, complicated hospitalization including malnutrition and delayed gastric emptying, VRE bacteremia, liver and abdominal abscesses (VRE, enterobacter, C. glabrata and C. Albicans), Enterobacter cloacae bacteremia, E.coli bacteremia, Asp PNA. She has been on multiple antibiotics since January, with episodes of early sepsis on previous attempts to deescalate and withdraw antibiotics.  Amanda Galloway has been troubled with intermittent sudden onset emesis with or without preceding nausea and poor po intake, however, over the past 1-2 weeks she has been clinically progressing well with increased PO intake, decreased dependence on TFs. We were recommending considering stopping all antibiotics in near future given how well she has improved clnically. She now remains on only Augmentin BID.      Amanda Galloway had another episode of emesis 2 days ago and GI was consulted (see GI note from yesterday).      She went for an EGD today and it was noted she has mild candida esophagitis proximally and suspected gastroparesis.  She was started on oral diflucan today.  However,  we are concerned that previous candida glabrata and candida albicans which grew from intraabdominal collections were resistant to fluconazole, also glabrata with resistance pattern to voriconazole (MIC >8 mcg/ml).       Therefore, we recommend treating this patient's candida esophagitis with IV caspofungin starting with 70 mg loading dose, then following with 50 mg daily, duration for up to 14 days depending on her symptoms.  She will need LFT's monitored while on antifungal.     Discussed with covering provider A. Haugh, PA.     Amanda Galloway, ANP-BC  Infectious Diseases, Team 3  Pager [phone]  Office Ph [phone]

## 2017-08-16 NOTE — Progress Notes (Signed)
Palliative Care Progress Note    HPI: [name] w/ pancreatic CA s/p Whipple 1/25 (treatment for cure) w/ prolonged complicated hospitalization issues w/ infection, nutrition/appetite/nausea/constipation, s/p PEG w/ perf now resolved. Pt resumed methadone on 5/8 and weaned off her dilaudid PCA 5/23, most recent methadone increase on 6/6. All abd drains now removed. s/p PEG tube changed out & still issue of leaking so stopped TFs. Pt w/ acute episode of N/V on 6/13 warranting GI wu, s/p endoscopy 6/14 finding mild candida esophagitis, mild gastritis, gastroparesis and stool cx 6/13 + H.Pylori. GI rec'd low particle diet for gastroparesis. ID rec'd IV Caspo to treat candida d/t resistance to fluconazole in past, of dispo to SNF rehab.    Subjective: "I'm much better today. I'm not sure about this new diet but I'm glad we got some answers"    Objective: chart reviewed, visited pt this afternoon, she was in good spirits, had walked earlier today and tolerated endoscopy well, pt asking questions about low particle diet & covering provider had provided nice pt info sheet w/ foods she's able to have & what to avoid; spoke w/ dtr [name] briefly, she is pleased to have dx and hopes w/ treating the candida and H.pylori pt will improve in pain control and having low particle diet will improve pt's bloating and increase her ability to take more PO    Palliative Care ROS: abd pain by PEG tube 'Mild"-improves w/ po dilaudid  dPain   Mild  Anorexia   Moderate  Shortness of Breath   Mild  Tiredness/Fatigue   Mild  Drowsiness/Sleepiness   Mild  Constipation   no  Unable to Respond   no  Delirium   no   Last stool 6/13    Physical Examination:   BP: (99-135)/(42-78)   Temp:  [36.2 C (97.2 F)-36.9 C (98.4 F)]   Temp src: Temporal (06/14 1646)  Heart Rate:  [92-106]   Resp:  [16-17]   SpO2:  [91 %-99 %]   Gen appearance: older Caucasian female, sitting up in recliner, awake and smiling, endorses mild abd pain around PEG but  otherwise in good spirits and please lastest tests have revealed treatable infections  Lungs: easy resp, RA  Heart: reg  Abd: large, soft, PEG intact/clamped but leaking clear fluid from insertion site, needing new dsg around PEG, stool yesterday  Extrem: mild to mod edematous LEs  Skin:  Dry, ted stockings on LEs  Neuro: A+O x 3 when visited early afternoon, she had ambulated x 2 so far today w/ walker around unit today & planned to ambulate again     Assessment/Plan:  [name] w/ pancreatic CA s/p Whipple (treatment for cure) w/ prolonged hospitalization. Pt had set back w/ N/V yesterday, better today s/p endoscopy revealing mild candida esophagitis and stool cx + H. Pylori. Pt reports pain is currently controlled on current regimen of methadone + PO dilaudid prn. Pt overall in good spirits and feels she is getting stronger.    Pain/dyspnea  Dilaudid 8 mg po Q 2hrs prn (x 5 on 6/13, x 2 on 6/14) 32 mg in last 24 hrs  Lyrica 75 mg po BID for fibromyalgia (total 150 mg/d, increased 5/28)  Methadone 10 mg po TID (total 30 mg/d, increased last 6/6) last ECG on 5/29 w/ QTc 426  Albuterol neb Q 6 hrs prn (none used)  Lidocaine patch daily x 2 (abd, LUE)  Acetaminophen 650 mg po Q 6 hrs prn fever  Bengay cream 4x/d prn to joints (x 1 on 6/13)  Guaifenesin 600 mg po BID expectorant    Anxiety/agitation/nausea/fatigue  Cymbalta 20 mg po/d  Metoclopramide 10 mg po TID AC  Pantoprazole 40 mg po BID  Ondansetron 4 mg IV Q 6 hrs prn (x 1 on 6/13, x 1 on 6/14) -? Now ordered daily at 10 pm (started 6/13)  Promethazine 12.5 mg IV Q 6 hrs prn (x 2 on 6/13)  Ativan 0.25 mg IV Q 6 hrs prn (last x 1 on 5/16)  Encouraged complementary adjuvant therapies for coping, meditation app, aromatherapy, gentle massage      Prevention of opioid induced constipation, last stool 6/13  Bisacodyl supp pr daily (not taking or needed - would switch to PRN)  Miralax 17 gm po daily  Senna 2 tab po BID    Skin/oral care  Aquaphor prn   Eucerin prn  for BLEs  Miconazole powder BID under breasts    GOC/prognosis  Full code  Dtr Rod Mae is HCP (very supportive and informed, former [facility] RN)  Goal currently is to prioritize pt's symptom control, long course though pt has endorsed agreement in past w/ continuing current treatment plan, dispo to SNF in hopes of eventually making recovery back to independent living; pt feeling stronger and more encouraged    Pt case discussed w/ RNs & covering provider.  We will continue to follow along. Please call if questions/concerns or if pt needs to be seen over WE.    Total Time Spent 45 minutes:             >50% of time was spent in counseling and/or coordination of care.     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    Anemia D64.9    Hypothyroidism E03.9    Anemia D64.9     hx of Pulmonary embolism I26.99    Acute pulmonary insufficiency J98.4    Depression F32.9    Sepsis A41.9       No Known Allergies (drug, envir, food or latex)    Scheduled Meds:    methadone  10 mg Oral TID    [START ON 08/17/2017] caspofungin  50 mg Intravenous Q24H    tetracycline  500 mg Oral 4x Daily    amoxicillin  1,000 mg Oral 2 times per day    ondansetron  4 mg Intravenous Daily @ 2200    sodium chloride  1 g Per G Tube TID PC    lidocaine  1 patch Transdermal Q24H    pantoprazole  40 mg Oral BID AC    PROSOURCE NO CARB  30 mL Oral BID WC    pregabalin  75 mg Oral 2 times per day    lidocaine  1 patch Transdermal Q24H    furosemide  10 mg Oral BID    senna  2 tablet Oral 2 times per day    miconazole   Topical 2 times per day    guaiFENesin  600 mg Oral 2 times per day    metoclopramide  10 mg Oral TID AC    levothyroxine  150 mcg Oral Daily @ 0600     enoxaparin  40 mg Subcutaneous Daily @ 2100  polyethylene glycol  17 g Oral Daily    lactobacillus rhamnosus (GG)  1 capsule Oral Daily    bisacodyl  10 mg Rectal Daily    DULoxetine  20 mg Oral Q24H       Continuous Infusions:    electrolyte-148 100 mL/hr (08/16/17 0924)       PRN Meds:  HYDROmorphone, sodium chloride, dextrose, naloxone, sodium chloride, dextrose, HYDROmorphone PF **OR** HYDROmorphone PF, promethazine, ondansetron, albuterol, methyl salicylate-menthol, mineral oil-hydrophilic petrolatum, sodium chloride, sodium chloride

## 2017-08-16 NOTE — Procedures (Signed)
Procedure Report    08/16/2017  Amanda Galloway  [id number]    Procedure: EGD  Pre op Diagnosis:    Nausea and vomiting  Post op Diagnosis:    Gastroparesis; mild candida esophagitis    Risks, benefits, alternatives discussed prior to consent.  Preprocedure pause and patient identification performed ("time-out") in the procedure room before starting.    EBL <1 ml    Medications used for sedation:  Versed 8 mg IV Fentanyl 50 mcg IV  I personally ordered and supervised administration of all moderate sedation medications.    Upper endoscopy carried out to the efferent small bowel limb, out of the stomach anastomosis, in approximately 30-40 cm. There was residual food and pill debris in the stomach that limited one wall evaluation, but much of the stomach seen.    Findings:    The esophagus was mildly normal. Mild candida esophagitis seen proximally. No masses, no other mucosal abnormalities, no Barrett's esophagus, no varices. The gastroesophageal junction was located 34 cm from the incisors.      The residual stomach and anastomosis was normal in regards to: no masses, no ulcers, no erosions, no vascular abnormalities... However posterior wall covered by un suctionable debris.  No outlet obstruction seen.  A gastrostomy balloon is seen in the distal stomach/anastomosis.  Mild gastritis seen on proximal mucosal edge but no ulcer seen.           The efferent small bowel limb was intubated about 30-40 cm in and it was normal. No ulcers or strictures. No masses. Villi were seen.         Impression:  No overt outlet obstruction. Suspect gastroparesis. Mild candida esophagitis proximally    Recommendation:    "Low particle diet" if taking po (Am J Gastroenterol. 2014 Mar;109(3):375-85. doi: 10.1038/ajg.2013.453 )   A very short course of diflucan should treat the esophagitis.      Nelly Laurence, MD  Associate Professor of Clinical Medicine  Attending Physician, Division of Gastroenterology and Hepatology, [facility]      Moderate  Sedation Face Times  Start Time: [phone]  End Time: 1003  Duration (minutes): 16 Minutes

## 2017-08-17 ENCOUNTER — Other Ambulatory Visit: Payer: Self-pay | Admitting: Psychiatry

## 2017-08-17 DIAGNOSIS — I499 Cardiac arrhythmia, unspecified: Secondary | ICD-10-CM

## 2017-08-17 LAB — CBC AND DIFFERENTIAL
Baso # K/uL: 0.1 10*3/uL (ref 0.0–0.1)
Basophil %: 0.9 %
Eos # K/uL: 0.4 10*3/uL (ref 0.0–0.4)
Eosinophil %: 4.4 %
Hematocrit: 25 % — ABNORMAL LOW (ref 34–45)
Hemoglobin: 8.1 g/dL — ABNORMAL LOW (ref 11.2–15.7)
IMM Granulocytes #: 0.2 10*3/uL
IMM Granulocytes: 1.8 %
Lymph # K/uL: 1 10*3/uL — ABNORMAL LOW (ref 1.2–3.7)
Lymphocyte %: 10.3 %
MCH: 31 pg/cell (ref 26–32)
MCHC: 32 g/dL (ref 32–36)
MCV: 96 fL — ABNORMAL HIGH (ref 79–95)
Mono # K/uL: 1.3 10*3/uL — ABNORMAL HIGH (ref 0.2–0.9)
Monocyte %: 13.6 %
Neut # K/uL: 6.4 10*3/uL — ABNORMAL HIGH (ref 1.6–6.1)
Nucl RBC # K/uL: 0 10*3/uL (ref 0.0–0.0)
Nucl RBC %: 0 /100 WBC (ref 0.0–0.2)
Platelets: 320 10*3/uL (ref 160–370)
RBC: 2.7 MIL/uL — ABNORMAL LOW (ref 3.9–5.2)
RDW: 20 % — ABNORMAL HIGH (ref 11.7–14.4)
Seg Neut %: 69 %
WBC: 9.3 10*3/uL (ref 4.0–10.0)

## 2017-08-17 LAB — COMPREHENSIVE METABOLIC PANEL
ALT: 33 U/L (ref 0–35)
AST: 52 U/L — ABNORMAL HIGH (ref 0–35)
Albumin: 3.3 g/dL — ABNORMAL LOW (ref 3.5–5.2)
Alk Phos: 794 U/L — ABNORMAL HIGH (ref 35–105)
Anion Gap: 14 (ref 7–16)
Bilirubin,Total: 0.4 mg/dL (ref 0.0–1.2)
CO2: 24 mmol/L (ref 20–28)
Calcium: 8.6 mg/dL (ref 8.6–10.2)
Chloride: 96 mmol/L (ref 96–108)
Creatinine: 0.63 mg/dL (ref 0.51–0.95)
GFR,Black: 104 *
GFR,Caucasian: 90 *
Glucose: 105 mg/dL — ABNORMAL HIGH (ref 60–99)
Lab: 11 mg/dL (ref 6–20)
Potassium: 4.2 mmol/L (ref 3.3–5.1)
Sodium: 134 mmol/L (ref 133–145)
Total Protein: 7.4 g/dL (ref 6.3–7.7)

## 2017-08-17 MED ORDER — BUTALBITAL-APAP-CAFFEINE 50-325-40 MG PO TABS *I*
2.0000 | ORAL_TABLET | Freq: Once | ORAL | Status: AC
Start: 2017-08-17 — End: 2017-08-17
  Administered 2017-08-17: 2 via ORAL
  Filled 2017-08-17: qty 2

## 2017-08-17 MED ORDER — SODIUM CHLORIDE 0.9 % 100 ML IV SOLN *I*
6.2500 mg | Freq: Once | INTRAVENOUS | Status: AC
Start: 2017-08-17 — End: 2017-08-17
  Administered 2017-08-17: 6.25 mg via INTRAVENOUS
  Filled 2017-08-17: qty 1

## 2017-08-17 MED ORDER — LACTATED RINGERS IV SOLN *I*
50.0000 mL/h | INTRAVENOUS | Status: DC
Start: 2017-08-17 — End: 2017-08-20
  Administered 2017-08-17: 50 mL/h via INTRAVENOUS
  Administered 2017-08-17 – 2017-08-18 (×11): 50 mL/h
  Administered 2017-08-18: 50 mL/h via INTRAVENOUS
  Administered 2017-08-18 – 2017-08-19 (×9): 50 mL/h
  Administered 2017-08-19 (×2): 50 mL/h via INTRAVENOUS
  Administered 2017-08-20 (×2): 50 mL/h
  Administered 2017-08-20: 50 mL/h via INTRAVENOUS
  Administered 2017-08-20: 50 mL/h

## 2017-08-17 MED ORDER — LORAZEPAM 0.5 MG PO TABS *I*
0.2500 mg | ORAL_TABLET | Freq: Every evening | ORAL | Status: DC | PRN
Start: 2017-08-17 — End: 2017-08-24
  Administered 2017-08-17: 0.25 mg via ORAL
  Filled 2017-08-17: qty 1

## 2017-08-17 MED ORDER — SODIUM CHLORIDE 0.9 % 100 ML IV SOLN *I*
12.5000 mg | Freq: Once | INTRAVENOUS | Status: DC
Start: 2017-08-17 — End: 2017-08-17

## 2017-08-17 MED ORDER — METOCLOPRAMIDE HCL 10 MG PO TABS *I*
10.0000 mg | ORAL_TABLET | Freq: Four times a day (QID) | ORAL | Status: DC
Start: 2017-08-17 — End: 2017-08-24
  Administered 2017-08-17 – 2017-08-24 (×28): 10 mg via ORAL
  Filled 2017-08-17 (×37): qty 1

## 2017-08-17 NOTE — Progress Notes (Addendum)
Pt with c/o nausea, given PRN zofran at 1906. Nausea still present with small volume emesis, but pt refusing 2nd line PRN phenergan because it made her sleepy and confused before. NP Amy Alvira Monday notified, IV haldol given per order. Pt with minimal relief, but was able to take her PO antibiotics with much encouragement, although she refused all other PO meds. Scheduled zofran given, but Pt refused her methadone due to continued nausea. NP Torres notified, decadron given per order. Pt sleeping at this time. Will continue to monitor.    0358 Pt with 865ml emesis. PRN zofran given, NP Torres notified. Will continue to monitor.

## 2017-08-17 NOTE — Plan of Care (Signed)
Problem: Safety  Goal: Patient will remain free of falls  Outcome: Maintaining      Problem: Pain/Comfort  Goal: Patient's pain or discomfort is manageable  Outcome: Maintaining      Problem: Mobility  Goal: Functional status is maintained or improved - Geriatric  Outcome: Maintaining      Problem: Nutrition  Goal: Patient's nutritional status is maintained or improved  Outcome: Progressing towards goal    Goal: Nutritional status is maintained or improved - Geriatric  Outcome: Progressing towards goal      Problem: Cognitive function  Goal: Cognitive function will be maintained  Outcome: Maintaining    Goal: Cognitive function will be maintained  Outcome: Maintaining    Goal: Cognitive function will be maintained or return to baseline  Outcome: Maintaining      Problem: Bowel Elimination  Goal: Elimination patterns are normal or improving  Outcome: Maintaining      Problem: Post-Operative Hemodynamic Stability  Goal: Maintain Hemodynamic Stability  Outcome: Maintaining      Problem: Post-Operative Complications  Goal: Prevent post-operative complications  Outcome: Maintaining      Problem: Post-Operative Bowel Elimination  Goal: Elimination pattern is normal or improving  Outcome: Maintaining      Problem: Psychosocial  Goal: Demonstrates ability to cope with illness  Outcome: Progressing towards goal      Problem: Fluid and Electrolyte Imbalance  Goal: Fluid and Electrolyte imbalance  Outcome: Maintaining      Problem: GI Bleeding Elimination  Goal: Elimination of patterns are normal or improving  Outcome: Maintaining      Problem: Post-Operative Bladder Elimination  Goal: Patient is able to empty bladder or return to baseline  Outcome: Maintaining

## 2017-08-17 NOTE — Progress Notes (Addendum)
Surgical Oncology Surgery Progress Note    Patient: Amanda Galloway    LOS: 141 days    Attending: [place]    EGD yesterday significant for candidal esophagitis. Now treated with IV Caspo per ID  No evidence of ulcer formation  Started on triple therapy for H.pylori (+)  Emesis yesterday evening       OBJECTIVE  Physical Exam:  Temp:  [36 C (96.8 F)-36.7 C (98.1 F)] 36.4 C (97.5 F)  Heart Rate:  [85-106] 85  Resp:  [16-17] 16  BP: (99-135)/(42-80) 118/70     GEN: no apparent distress  HEENT: NCAT, face symmetric  CHEST: nonlabored respirations  ABD: soft, mildly distended, tender surrounding G-tube that is CDI, minimal erythema, without active leaking   NEURO/MOTOR: alert, appropriate  EXTREMITIES: atraumatic  VASCULAR: feet wwp, no edema    Intake/Output:  06/14 0700 - 06/15 0659  In: 4099.4 [P.O.:418; I.V.:3196.4]  Out: 800      Medications:  Current Facility-Administered Medications   Medication    metoclopramide (REGLAN) tablet 10 mg    HYDROmorphone (DILAUDID) tablet 8 mg    sodium chloride 0.9 % FLUSH REQUIRED IF PATIENT HAS IV    dextrose 5 % FLUSH REQUIRED IF PATIENT HAS IV    electrolyte-148 (PLASMALYTE) solution    methadone (DOLOPHINE) tablet 10 mg    caspofungin (CANCIDAS) 50 mg in sodium chloride 0.9% 100 mL mini-bag    naloxone (NARCAN) 0.4 mg/mL injection 0.1 mg    tetracycline (ACHROMYCIN,SUMYCIN) capsule 500 mg    amoxicillin (AMOXIL) capsule 1,000 mg    ondansetron (ZOFRAN) injection 4 mg    sodium chloride tablet 1 g    sodium chloride 0.9 % FLUSH REQUIRED IF PATIENT HAS IV    dextrose 5 % FLUSH REQUIRED IF PATIENT HAS IV    lidocaine (LIDODERM) 5 % patch 1 patch    pantoprazole (PROTONIX) EC tablet 40 mg    PROSOURCE NO CARB liquid LIQD 30 mL    pregabalin (LYRICA) capsule 75 mg    lidocaine (LIDODERM) 5 % patch 1 patch    furosemide (LASIX) tablet 10 mg    senna (SENOKOT) tablet 2 tablet    promethazine (PHENERGAN) 12.5 mg in sodium  chloride 0.9% 25 mL IVPB    miconazole (MICATIN) 2 % powder    ondansetron (ZOFRAN) injection 4 mg    guaiFENesin (MUCINEX) 12 hr tablet 600 mg    levothyroxine (SYNTHROID, LEVOTHROID) tablet 150 mcg    enoxaparin (LOVENOX) injection 40 mg    albuterol (PROVENTIL) nebulization 2.5 mg    polyethylene glycol (GLYCOLAX,MIRALAX) powder 17 g    lactobacillus rhamnosus (GG) (CULTURELLE) capsule 1 each    methyl salicylate-menthol (BENGAY) topical cream    bisacodyl (DULCOLAX) suppository 10 mg    DULoxetine (CYMBALTA) DR capsule 20 mg    mineral oil-hydrophilic petrolatum (AQUAPHOR) ointment    sodium chloride 0.9 % flush 10 mL    sodium chloride 0.9 % flush 10 mL     Facility-Administered Medications Ordered in Other Encounters   Medication    etomidate (AMIDATE) 2 mg/mL injection    rocuronium (ZEMURON) 10 mg/mL injection         Laboratory values:   Recent Labs      08/17/17   0010  08/15/17   2352   WBC  9.3  12.5*   Hemoglobin  8.1*  8.0*   Hematocrit  25*  26*   Platelets  320  316     No components found with this basename: APTT, PT Recent Labs      08/17/17   0010  08/15/17   2352   Sodium  134  136   Potassium  4.2  4.3   Chloride  96  97   CO2  24  25   UN  11  12   Creatinine  0.63  0.71   Glucose  105*  104*   Calcium  8.6  9.0   Magnesium   --   1.8   Phosphorus   --   4.1    Recent Labs      08/17/17   0010  08/15/17   2352   AST  52*  53*   ALT  33  32   Alk Phos  794*  839*   Bilirubin,Total  0.4  0.5     Recent Labs      08/17/17   0010  08/15/17   2352   Total Protein  7.4  7.6   Albumin  3.3*  3.4*     No results for input(s): AMY, LIP in the last 72 hours.   GLUCOSE:   No results for input(s): PGLU in the last 72 hours.  Imaging: No results found.     ASSESSMENT  Amanda Galloway is a 72 y.o. female with h/o recent PE and pancreatic cancer POD # 102  status post whipple procedure complicated by delayed gastric emptying.  NGT replaced on 04/04/17. UGI on 04/12/17 concerning for obstruction  just beyond the [place] in the efferent limb.  Course complicated on 08/26/74 by rising LFTs and sepsis with CT concerning for cholangitis given biliary tree obstruction and PV compression due to hematoma and afferent limb distention in setting of PE treatment. Is s/p IR PTC on 04/16/17 and IR percutaneous aspiration of anterior abdominal fluid collection on 04/24/17. IR LUQ perc drain placement 3/1. IR anterior abdominal 88f perc drain placement 3/3. IR anterior perc drain into left hepatic collection 3/6. Developed E. Coli bacteremia on 07/26/17 presumed secondary to aspiration pneumonia.    PLAN  Continue Amoxicillin. PPI. Tetracycline for H.pylori treatment. IV Caspo for Candidal esophagitis  EGD on 6/14 without evidence of PUD. (+) candidal esophagitis.  Cont D3 diet. Encourage PO intake. Nutritional intake sub-optimal. May need to resume TF's if continuing to have PO intolerance. Increase Reglan to QID  C/w aggressive pulmonary toilet, OOB/ambulate, PT/OT  Bowel regimen   Analgesia with PO pain. Appreciate palliative care assistance   DVT ppx - Lovenox   Dispo: [id number]   Author: JBelinda Fisher MD as of: 08/17/2017  at: 9:09 AM         [facility]B-GI Attending Addendum:   I personally examined the patient, reviewed the notes, and discussed the plan of care with the residents and patient. Agree with detailed resident's note. Please see the note above for details of history, exam, labs, assessment/plan which reflect my input.     72year old female with pancreatic cancer status post pancreaticoduodenectomy complicated by fluid collections and malnutrition, and gastrostomy tube placement with perforation which has resolved.     Doing well with good spirit and increased functional activity.   Episode of vomiting this am without cause.     Pulmonary effusions. Stable.   Malnutrition. Encouraging oral intake. Calorie counts. Hold gastrostomy tube feeding.   Trial of oral intake.   Sepsis with EColi Bacteremia due to biliary  source/cholangitis. Resolved.  Will need long-term suppressive antibiotics.   Hyperglycemia with intermittent need for sliding scale.   Elevated liver enzymes related to recent cholangitis. Improving.   Anemia of chronic disease. Iron supplementation and oral dietary encouragement.     Continued abdominal pain requiring excess narcotics. Considering possible alterantive etiologies given normal imaging findings at location of gastrostomy tube.   Will ask GI to consider endoscopy to assess for gastritis, gastric or jejunal ulceration.   HPylori antigen positive. Treating for therapy.       Delano Metz, MD, FACS  Hepatobiliary, Pancreas & GI Surgery

## 2017-08-17 NOTE — Progress Notes (Signed)
Received Zofran IV this morning prior to morning medications with applesauce. Out of bed to bathroom, medium soft/loose BM in bathroom. While sitting on toilet became nauseous - vomit 150 cc emesis containing applesauce, fluids, and some medications. Provider web paged and made aware- plan to come see pt. Approximately 2 hours later additional emesis small amount- applesauce appearance- provider Wang,B web paged. Pt report not comfortable taking Phenergan regular dose being it made her very lethargic a few nights ago. Provider asked if patient can have Phenergan 6.25 mg instead. Engineer, agricultural paged provider to clarify plan for Phenergan; new order placed for Phenergan 6.25mg  IV. Pt states she rather take the lower dose because she is tired of feeling this way. Forty minutes after Phenergan, pt alert and oriented, facial flushed from earlier emesis, but able to swallow afternoon medications with vanilla pudding. Next shift made aware, will continue to monitor.

## 2017-08-18 LAB — COMPREHENSIVE METABOLIC PANEL
ALT: 31 U/L (ref 0–35)
AST: 54 U/L — ABNORMAL HIGH (ref 0–35)
Albumin: 3.3 g/dL — ABNORMAL LOW (ref 3.5–5.2)
Alk Phos: 759 U/L — ABNORMAL HIGH (ref 35–105)
Anion Gap: 14 (ref 7–16)
Bilirubin,Total: 0.4 mg/dL (ref 0.0–1.2)
CO2: 24 mmol/L (ref 20–28)
Calcium: 8.8 mg/dL (ref 8.6–10.2)
Chloride: 97 mmol/L (ref 96–108)
Creatinine: 0.61 mg/dL (ref 0.51–0.95)
GFR,Black: 105 *
GFR,Caucasian: 91 *
Glucose: 101 mg/dL — ABNORMAL HIGH (ref 60–99)
Lab: 17 mg/dL (ref 6–20)
Potassium: 3.7 mmol/L (ref 3.3–5.1)
Sodium: 135 mmol/L (ref 133–145)
Total Protein: 7.4 g/dL (ref 6.3–7.7)

## 2017-08-18 LAB — CBC AND DIFFERENTIAL
Baso # K/uL: 0.1 10*3/uL (ref 0.0–0.1)
Basophil %: 0.9 %
Eos # K/uL: 0.1 10*3/uL (ref 0.0–0.4)
Eosinophil %: 0.9 %
Hematocrit: 24 % — ABNORMAL LOW (ref 34–45)
Hemoglobin: 7.8 g/dL — ABNORMAL LOW (ref 11.2–15.7)
Lymph # K/uL: 1 10*3/uL — ABNORMAL LOW (ref 1.2–3.7)
Lymphocyte %: 13.5 %
MCH: 31 pg/cell (ref 26–32)
MCHC: 32 g/dL (ref 32–36)
MCV: 95 fL (ref 79–95)
Mono # K/uL: 0.7 10*3/uL (ref 0.2–0.9)
Monocyte %: 9.9 %
Neut # K/uL: 5.2 10*3/uL (ref 1.6–6.1)
Nucl RBC # K/uL: 0 10*3/uL (ref 0.0–0.0)
Nucl RBC %: 0 /100 WBC (ref 0.0–0.2)
Platelets: 333 10*3/uL (ref 160–370)
RBC: 2.6 MIL/uL — ABNORMAL LOW (ref 3.9–5.2)
RDW: 19.5 % — ABNORMAL HIGH (ref 11.7–14.4)
Seg Neut %: 74.8 %
WBC: 6.9 10*3/uL (ref 4.0–10.0)

## 2017-08-18 LAB — DIFF MANUAL: Diff Based On: 111 CELLS

## 2017-08-18 MED ORDER — ALTEPLASE 2 MG IJ SOLR *I*
0.5000 mg | Freq: Once | INTRAMUSCULAR | Status: AC
Start: 2017-08-18 — End: 2017-08-18
  Administered 2017-08-18: 2 mg
  Filled 2017-08-18: qty 2

## 2017-08-18 NOTE — Progress Notes (Signed)
Physical Therapy Treatment Note:     08/18/17 0925   PT Tracking   PT TRACKING PT Assigned   Visit Number   Visit Number [facility]) / Treatment Day [facility]) 8   Visit Details [facility])   Visit Type [facility]) Follow Up   Reason for visit [facility]) General   Precautions/Observations   Precautions used Yes   LDA Observation IV lines   Fall Precautions General falls precautions   Other Writer requesting PCT to assist pt with ambulation prior to writer working with pt. PCT ambulated pt ~824ft with RW and SBA. Pt reports feeling better today.   Pain Assessment   *Is the patient currently in pain? Denies   Cognition   Arousal/Alertness Appropriate responses to stimuli   Orientation Oriented to person;Oriented to place;Oriented to situation   Following Commands Follows simple commands without difficulty;Follows simple commands consistently   Additional Comments Pt able to recall previous events from day before with accuracy. Writer prompted at start of session 3 words: car, house, spoon. PT was able to recall words after ~5 minutes and after ~30 minutes. Pt required increased time for orientation questions but answered them all correctly.   Bed Mobility   Bed mobility Tested   Supine to Sit Independent;Head of bed flat;Side rails down   Sit to Supine Independent;Head of bed flat;Side rails down   Additional comments Pt completed x3 without need of hands on assist or VCs, able to lift BLE in/out of bed without difficulty.   Transfers   Transfers Tested   Sit to Stand Stand by assistance;1 person assist   Stand to sit Stand by assistance;1 person assist   Transfer Assistive Device none   Additional comments Pt up from edge of bed x10. Pt able to rise without hands on assist and mild unsteadiness upon initial standing. Pt using BUE minimally to assist with transfer. Completed repetition to promote BLE strengthening.   Mobility   Mobility Tested   Gait Pattern Decreased cadence;Unsteady  (mild unsteadiness with anteriorly displaced COM)    Ambulation Assist Contact guard;1 person assist   Ambulation Distance (Feet) 321ft   Ambulation Assistive Device None  (IV pole)   Stairs Assistance Contact guard;1 person assist   Stair Management Technique One rail;Alternating pattern;Forwards   Number of Stairs 16   Additional comments Pt ambulated with decreased assistive device. PT using IV pole for external support and was able to complete with flucuating between SBA and CGA. PT requried cues for improved heel strike and had tendency for anteriorly displaced COM neeidng cues and tactile cues for correction. Pt completed increased steps with single rail to assist with BLE strengthening and endurance. Pt able to complete with SBA, intermittently needing CGA d/t mild posterior LOB x2.   Balance   Balance Tested   Sitting - Static Independent ;Unsupported   Standing - Static Standby assist;Unsupported   Standing - Dynamic Contact guard;Unsupported   PT AM-PAC Mobility   Turning over in bed? 4   Sitting down on and standing up from a chair with arms? 1   Moving from lying on back to sitting on the side of the bed? 4   Moving to and from a bed to a chair? 3   Need to walk in hospital room? 3   Climbing 3 - 5 steps with a railing? 3   Total Raw Score 18   Standardized Score 43.63   CMS 1-100% Score 47   Medicare Functional Limit Modifiers CK 40 - < 60%  impaired, limited, restricted   Assessment   Brief Assessment Remains appropriate for skilled therapy   Problem List Impaired balance;Impaired functional mobility   Patient / Family Goal home   Additional Comments Pt is improving greatly with mobility. Pt currently able to tolerate ambulation and transfers without RW. However pt still requires 1A for transfers, ambulation, and stairs. She continues to present with decreased strength and impaired balance. Pt doing well, anticipate progression to home with intermittent supervision/assist but pt is not safe for discharge today. Anticipate need for 2-4 more PT visits to  allow return home.   Plan/Recommendation   Treatment Interventions Restorative PT;Bed mobility training;Transfers training;Balance training;Stair training;Pt/Family education;Strengthening;Gait training;Home exercise program instruction;D/C planning   PT Frequency 3-5x/wk   Mobility Recommendations 1A with IV pole or without AD; please assist with ambulation 3-5x/day   Discharge Recommendations Anticipate return to prior living arrangement;Home PT;Intermittent supervision/assist   PT Discharge Equipment Recommended None  (anticipate none)   Assessment/Recommendations Reviewed With: Patient;Nursing;Care coordinator   Next PT Visit higher level balance, ambulation and stairs   Additional Recommendations Pt is improving with overall mobility and cognition. If pt returning home alone, would recommend need of SNF rehab to maximize all function and independence. If pt returning home with daughter, would recommend home with home PT and intermittent supervision for higher level skills involving tasking for initial discharge; such as medication management, laundry, cooking/cleaning. If daughter unable to provide this assist would recommend SNF rehab.   Time Calculation   PT Timed Codes 25   PT Untimed Codes 0   PT Unbilled Time 0   PT Total Treatment 25   Plan and Onset date   Plan of Care Date 08/18/17   Onset Date 03/29/17   Treatment Start Date 07/08/17     Rodena Piety, PT, DPT  Pager [phone]

## 2017-08-18 NOTE — Progress Notes (Signed)
Surgical Oncology Surgery Progress Note    Patient: Amanda Galloway    LOS: 142 days    Attending: Ron Agee     INTERVAL HISTORY & SUBJECTIVE    NAE overnight. Several small episodes of emesis yest AM  Reglan increased to QID.  Feels well this AM without nausea       OBJECTIVE  Physical Exam:  Temp:  [36.2 C (97.2 F)-37 C (98.6 F)] 36.2 C (97.2 F)  Heart Rate:  [87-93] 89  Resp:  [16] 16  BP: (120-128)/(64-78) 128/72     GEN: no apparent distress  HEENT: NCAT, face symmetric  CHEST: nonlabored respirations  ABD: soft, mildly distended, tender surrounding G-tube that is CDI, minimal erythema, without active leaking   NEURO/MOTOR: alert, appropriate  EXTREMITIES: atraumatic  VASCULAR: feet wwp, no edema    Intake/Output:  06/15 0700 - 06/16 0659  In: 1354.1 [P.O.:220; I.V.:889.1]  Out: 200      Medications:  Current Facility-Administered Medications   Medication    metoclopramide (REGLAN) tablet 10 mg    Lactated Ringers Infusion    LORazepam (ATIVAN) tablet 0.25 mg    HYDROmorphone (DILAUDID) tablet 8 mg    sodium chloride 0.9 % FLUSH REQUIRED IF PATIENT HAS IV    dextrose 5 % FLUSH REQUIRED IF PATIENT HAS IV    methadone (DOLOPHINE) tablet 10 mg    caspofungin (CANCIDAS) 50 mg in sodium chloride 0.9% 100 mL mini-bag    naloxone (NARCAN) 0.4 mg/mL injection 0.1 mg    tetracycline (ACHROMYCIN,SUMYCIN) capsule 500 mg    amoxicillin (AMOXIL) capsule 1,000 mg    ondansetron (ZOFRAN) injection 4 mg    sodium chloride tablet 1 g    sodium chloride 0.9 % FLUSH REQUIRED IF PATIENT HAS IV    dextrose 5 % FLUSH REQUIRED IF PATIENT HAS IV    lidocaine (LIDODERM) 5 % patch 1 patch    pantoprazole (PROTONIX) EC tablet 40 mg    PROSOURCE NO CARB liquid LIQD 30 mL    pregabalin (LYRICA) capsule 75 mg    lidocaine (LIDODERM) 5 % patch 1 patch    furosemide (LASIX) tablet 10 mg    senna (SENOKOT) tablet 2 tablet    promethazine (PHENERGAN) 12.5 mg in sodium chloride 0.9% 25 mL IVPB    miconazole (MICATIN)  2 % powder    ondansetron (ZOFRAN) injection 4 mg    guaiFENesin (MUCINEX) 12 hr tablet 600 mg    levothyroxine (SYNTHROID, LEVOTHROID) tablet 150 mcg    enoxaparin (LOVENOX) injection 40 mg    albuterol (PROVENTIL) nebulization 2.5 mg    polyethylene glycol (GLYCOLAX,MIRALAX) powder 17 g    lactobacillus rhamnosus (GG) (CULTURELLE) capsule 1 each    methyl salicylate-menthol (BENGAY) topical cream    bisacodyl (DULCOLAX) suppository 10 mg    DULoxetine (CYMBALTA) DR capsule 20 mg    mineral oil-hydrophilic petrolatum (AQUAPHOR) ointment    sodium chloride 0.9 % flush 10 mL    sodium chloride 0.9 % flush 10 mL     Facility-Administered Medications Ordered in Other Encounters   Medication    etomidate (AMIDATE) 2 mg/mL injection    rocuronium (ZEMURON) 10 mg/mL injection         Laboratory values:   Recent Labs      08/18/17   0007  08/17/17   0010   WBC  6.9  9.3   Hemoglobin  7.8*  8.1*   Hematocrit  24*  25*   Platelets  333  320     No components found with this basename: APTT, PT Recent Labs      08/18/17   0007  08/17/17   0010  08/15/17   2352   Sodium  135  134  136   Potassium  3.7  4.2  4.3   Chloride  97  96  97   CO2  _0 UN  _1 Creatinine  0.61  0.63  0.71   Glucose  101*  105*  104*   Calcium  8.8  8.6  9.0   Magnesium   --    --   1.8   Phosphorus   --    --   4.1    Recent Labs      08/18/17   0007  08/17/17   0010   AST  54*  52*   ALT  31  33   Alk Phos  759*  794*   Bilirubin,Total  0.4  0.4     Recent Labs      08/18/17   0007  08/17/17   0010   Total Protein  7.4  7.4   Albumin  3.3*  3.3*     No results for input(s): AMY, LIP in the last 72 hours.   GLUCOSE:   No results for input(s): PGLU in the last 72 hours.  Imaging: No results found.     ASSESSMENT  Amanda Galloway is a 72 y.o. female with h/o recent PE and pancreatic cancer POD # 102  status post whipple procedure complicated by delayed gastric emptying.  NGT replaced on 04/04/17. UGI on 04/12/17 concerning for  obstruction just beyond the [place] in the efferent limb.  Course complicated on 08/16/41 by rising LFTs and sepsis with CT concerning for cholangitis given biliary tree obstruction and PV compression due to hematoma and afferent limb distention in setting of PE treatment. Is s/p IR PTC on 04/16/17 and IR percutaneous aspiration of anterior abdominal fluid collection on 04/24/17. IR LUQ perc drain placement 3/1. IR anterior abdominal 63f perc drain placement 3/3. IR anterior perc drain into left hepatic collection 3/6. Developed E. Coli bacteremia on 07/26/17 presumed secondary to aspiration pneumonia.    PLAN  Continue Amoxicillin. PPI. Tetracycline for H.pylori treatment. IV Caspo for Candidal esophagitis  EGD on 6/14 without evidence of PUD. (+) candidal esophagitis.  Cont D3 diet. Encourage PO intake. Nutritional intake sub-optimal. Encourage PO today. May need to resume TF's if continuing to have PO intolerance. Cont Reglan QID  C/w aggressive pulmonary toilet, OOB/ambulate, PT/OT  Bowel regimen   Analgesia with PO pain. Appreciate palliative care assistance   DVT ppx - Lovenox   Dispo: [id number]   Author: JBelinda Fisher MD as of: 08/18/2017  at: 10:44 AM     [facility]B-GI Attending Addendum:   I personally examined the patient, reviewed the notes, and discussed the plan of care with the residents and patient. Agree with detailed resident's note. Please see the note above for details of history, exam, labs, assessment/plan which reflect my input.     72year old female status post pancreaticoduodenectomy with complications. Malnutrition. Encouraging oral intake. Intermittent vomiting suggestive of delayed gastric emptying. Reglan on board. Small meals.   Holding Anemia. No need for transfusion. Iron daily.   Effusions. No need for treatment.   Abdominal pain. Methadone with prn narcotics.     EDelano Metz MD, FACS  Hepatobiliary,  Pancreas & GI Surgery

## 2017-08-18 NOTE — Progress Notes (Signed)
Surgical Oncology Surgery Progress Note    Patient: Amanda Galloway    LOS: 142 days    Attending: Ron Agee     INTERVAL HISTORY & SUBJECTIVE    NAE overnight. Several small episodes of emesis yest AM  Reglan increased to QID.  Feels well this AM without nausea       OBJECTIVE  Physical Exam:  Temp:  [36.2 C (97.2 F)-37 C (98.6 F)] 36.2 C (97.2 F)  Heart Rate:  [87-93] 89  Resp:  [16] 16  BP: (120-128)/(64-78) 128/72     GEN: no apparent distress  HEENT: NCAT, face symmetric  CHEST: nonlabored respirations  ABD: soft, mildly distended, tender surrounding G-tube that is CDI, minimal erythema, without active leaking   NEURO/MOTOR: alert, appropriate  EXTREMITIES: atraumatic  VASCULAR: feet wwp, no edema    Intake/Output:  06/15 0700 - 06/16 0659  In: 1354.1 [P.O.:220; I.V.:889.1]  Out: 200      Medications:  Current Facility-Administered Medications   Medication    metoclopramide (REGLAN) tablet 10 mg    Lactated Ringers Infusion    LORazepam (ATIVAN) tablet 0.25 mg    HYDROmorphone (DILAUDID) tablet 8 mg    sodium chloride 0.9 % FLUSH REQUIRED IF PATIENT HAS IV    dextrose 5 % FLUSH REQUIRED IF PATIENT HAS IV    methadone (DOLOPHINE) tablet 10 mg    caspofungin (CANCIDAS) 50 mg in sodium chloride 0.9% 100 mL mini-bag    naloxone (NARCAN) 0.4 mg/mL injection 0.1 mg    tetracycline (ACHROMYCIN,SUMYCIN) capsule 500 mg    amoxicillin (AMOXIL) capsule 1,000 mg    ondansetron (ZOFRAN) injection 4 mg    sodium chloride tablet 1 g    sodium chloride 0.9 % FLUSH REQUIRED IF PATIENT HAS IV    dextrose 5 % FLUSH REQUIRED IF PATIENT HAS IV    lidocaine (LIDODERM) 5 % patch 1 patch    pantoprazole (PROTONIX) EC tablet 40 mg    PROSOURCE NO CARB liquid LIQD 30 mL    pregabalin (LYRICA) capsule 75 mg    lidocaine (LIDODERM) 5 % patch 1 patch    furosemide (LASIX) tablet 10 mg    senna (SENOKOT) tablet 2 tablet    promethazine (PHENERGAN) 12.5 mg in sodium chloride 0.9% 25 mL IVPB    miconazole (MICATIN)  2 % powder    ondansetron (ZOFRAN) injection 4 mg    guaiFENesin (MUCINEX) 12 hr tablet 600 mg    levothyroxine (SYNTHROID, LEVOTHROID) tablet 150 mcg    enoxaparin (LOVENOX) injection 40 mg    albuterol (PROVENTIL) nebulization 2.5 mg    polyethylene glycol (GLYCOLAX,MIRALAX) powder 17 g    lactobacillus rhamnosus (GG) (CULTURELLE) capsule 1 each    methyl salicylate-menthol (BENGAY) topical cream    bisacodyl (DULCOLAX) suppository 10 mg    DULoxetine (CYMBALTA) DR capsule 20 mg    mineral oil-hydrophilic petrolatum (AQUAPHOR) ointment    sodium chloride 0.9 % flush 10 mL    sodium chloride 0.9 % flush 10 mL     Facility-Administered Medications Ordered in Other Encounters   Medication    etomidate (AMIDATE) 2 mg/mL injection    rocuronium (ZEMURON) 10 mg/mL injection         Laboratory values:   Recent Labs      08/18/17   0007  08/17/17   0010   WBC  6.9  9.3   Hemoglobin  7.8*  8.1*   Hematocrit  24*  25*   Platelets  333  320     No components found with this basename: APTT, PT Recent Labs      08/18/17   0007  08/17/17   0010  08/15/17   2352   Sodium  135  134  136   Potassium  3.7  4.2  4.3   Chloride  97  96  97   CO2  _0 UN  _1 Creatinine  0.61  0.63  0.71   Glucose  101*  105*  104*   Calcium  8.8  8.6  9.0   Magnesium   --    --   1.8   Phosphorus   --    --   4.1    Recent Labs      08/18/17   0007  08/17/17   0010   AST  54*  52*   ALT  31  33   Alk Phos  759*  794*   Bilirubin,Total  0.4  0.4     Recent Labs      08/18/17   0007  08/17/17   0010   Total Protein  7.4  7.4   Albumin  3.3*  3.3*     No results for input(s): AMY, LIP in the last 72 hours.   GLUCOSE:   No results for input(s): PGLU in the last 72 hours.  Imaging: No results found.     ASSESSMENT  Amanda Galloway is a 72 y.o. female with h/o recent PE and pancreatic cancer POD # 102  status post whipple procedure complicated by delayed gastric emptying.  NGT replaced on 04/04/17. UGI on 04/12/17 concerning for  obstruction just beyond the [place] in the efferent limb.  Course complicated on 10/14/73 by rising LFTs and sepsis with CT concerning for cholangitis given biliary tree obstruction and PV compression due to hematoma and afferent limb distention in setting of PE treatment. Is s/p IR PTC on 04/16/17 and IR percutaneous aspiration of anterior abdominal fluid collection on 04/24/17. IR LUQ perc drain placement 3/1. IR anterior abdominal 66f perc drain placement 3/3. IR anterior perc drain into left hepatic collection 3/6. Developed E. Coli bacteremia on 07/26/17 presumed secondary to aspiration pneumonia.    PLAN  Continue Amoxicillin. PPI. Tetracycline for H.pylori treatment. IV Caspo for Candidal esophagitis  EGD on 6/14 without evidence of PUD. (+) candidal esophagitis.  Cont D3 diet. Encourage PO intake. Nutritional intake sub-optimal. Encourage PO today. May need to resume TF's if continuing to have PO intolerance. Cont Reglan QID  C/w aggressive pulmonary toilet, OOB/ambulate, PT/OT  Bowel regimen   Analgesia with PO pain. Appreciate palliative care assistance   DVT ppx - Lovenox   Dispo: [id number]   Author: JBelinda Fisher MD as of: 08/18/2017  at: 10:44 AM

## 2017-08-19 ENCOUNTER — Encounter: Payer: Self-pay | Admitting: Psychiatry

## 2017-08-19 DIAGNOSIS — I499 Cardiac arrhythmia, unspecified: Secondary | ICD-10-CM

## 2017-08-19 LAB — PREALBUMIN: Prealbumin: 11 mg/dL — ABNORMAL LOW (ref 20–40)

## 2017-08-19 MED ORDER — DULOXETINE HCL 20 MG PO CPEP *I*
20.0000 mg | DELAYED_RELEASE_CAPSULE | Freq: Every evening | ORAL | Status: DC
Start: 2017-08-19 — End: 2017-08-24
  Administered 2017-08-19 – 2017-08-23 (×5): 20 mg via ORAL
  Filled 2017-08-19 (×6): qty 1

## 2017-08-19 MED ORDER — DULOXETINE HCL 20 MG PO CPEP *I*
20.0000 mg | DELAYED_RELEASE_CAPSULE | ORAL | Status: DC
Start: 2017-08-19 — End: 2017-08-19

## 2017-08-19 NOTE — Progress Notes (Signed)
Assumed care of pt at 0700. A/Ox3. Pt  Much  More engaging    Today .    Pain well controled   c just scheduled   Methadone .   Pt voicing   Optimistic  Feelings .  Is anxious to get home .   Pt ambulated times 2 around the unit c  IV pole only .  Pt tolerated small   Amounts of diet .  Pt is aware her po intake needs to get better .   Pt seen by nutrition today , see note for full details . Pt peg dressing changed , site   Slightly red ,   Pt tolerated well. Flushed per order .   Pt voiding s difficulty .  Pt's  LE edema ,much improved c  addition of Lasix. Marland Kitchen  Pt currently resting in chair , call bell in  reach .  Pt looking forward  to seeing daughter this evening .   Report given to oncoming shift .

## 2017-08-19 NOTE — Progress Notes (Signed)
Infectious Diseases Follow Up Note    Personally reviewed chart, labs, medications. Patient seen.     CC:  F/U s/p VRE bacteremia, liver and abdominal abscesses (VRE, enterobacter, C. glabrata and C. albicans, s/p Enterobacter cloacae bacteremia, and most recently E.coli bacteremia, Asp PNA.    Interval History:  EGD 6/14 - Candida esophagitis - started on Caspofungin given prior resistance of candida sp to fluconazole.  Stool positive H.pylori antigen - started on triple therapy including amoxicillin and tetracycline on 6/15.     Subjective:  "I'm going home soon" - she offered with a smile.  Amanda Galloway is feeling very well, denies F/C/N/V/D/SOB, no dysuria, denies abdominal discomfort and endorses PEG tube site is not bothering her today.  She states she is "eating better than has in a long time".      ROS: relevant ROS as above.     Current Meds:  Scheduled Meds:   DULoxetine  20 mg Oral QPM    metoclopramide  10 mg Oral 4x Daily AC & HS    methadone  10 mg Oral TID    caspofungin  50 mg Intravenous Q24H    tetracycline  500 mg Oral 4x Daily    amoxicillin  1,000 mg Oral 2 times per day    ondansetron  4 mg Intravenous Daily @ 2200    sodium chloride  1 g Per G Tube TID PC    lidocaine  1 patch Transdermal Q24H    pantoprazole  40 mg Oral BID AC    PROSOURCE NO CARB  30 mL Oral BID WC    pregabalin  75 mg Oral 2 times per day    lidocaine  1 patch Transdermal Q24H    furosemide  10 mg Oral BID    senna  2 tablet Oral 2 times per day    miconazole   Topical 2 times per day    guaiFENesin  600 mg Oral 2 times per day    levothyroxine  150 mcg Oral Daily @ 0600    enoxaparin  40 mg Subcutaneous Daily @ 2100    polyethylene glycol  17 g Oral Daily    lactobacillus rhamnosus (GG)  1 capsule Oral Daily    bisacodyl  10 mg Rectal Daily    DULoxetine  20 mg Oral Q24H     Continuous Infusions:   lactated ringers 50 mL/hr (08/19/17 0627)     PRN Meds:.   calcium carbonate  1,000 mg Oral BID PRN     HYDROmorphone PF  0.5 mg Intravenous Q1H PRN    Or    HYDROmorphone PF  1 mg Intravenous Q1H PRN    LORazepam  0.5 mg Oral Q6H PRN    heparin lock flush  50 Units Intracatheter PRN    ondansetron  4 mg Intravenous Q6H PRN     Objective:  BP: (115-130)/(60-80)   Temp:  [36.4 C (97.5 F)-37.1 C (98.8 F)]   Temp src: Temporal (06/17 0849)  Heart Rate:  [76-88]   Resp:  [16]   SpO2:  [93 %-99 %]     Physical Exam:           General Appearance: NAD, sitting in chair, Smiling, getting up from chair without assist, appears with more strength and energy than I've seen her in the past.    HEENT: sclera anicteric, MMM, clear oropharynx  Pulm: few fine crackles right base, good air movement bilaterally  CV: RRR, no M/R/G. Bilateral LE edema  decreased.   Extremities: WWP  Abdomen:  Soft, NT/ND.  PEG intact, clamped.   Skin: warm/dry, no rashes    Neuro: Alert, oriented, appropriate in conversation, no obvious focal deficits noted.   Lines: PICC LUE (4/11) site benign.      Recent Labs  Lab 08/18/17  0007 08/17/17  0010 08/15/17  2352   WBC 6.9 9.3 12.5*   Hemoglobin 7.8* 8.1* 8.0*   Hematocrit 24* 25* 26*   Platelets 333 320 316   Seg Neut % 74.8 69.0 72.0   Lymphocyte % 13.5 10.3 9.7   Monocyte % 9.9 13.6 13.4   Eosinophil % 0.9 4.4 3.1       Recent Labs  Lab 08/18/17  0007 08/17/17  0010 08/15/17  2352   Sodium 135 134 136   Potassium 3.7 4.2 4.3   CO2 '24 24 25   ' UN '17 11 12   ' Creatinine 0.61 0.63 0.71   Glucose 101* 105* 104*   Calcium 8.8 8.6 9.0         Lab results: 08/18/17  0007 08/17/17  0010 08/15/17  2352 08/15/17  0034 08/13/17  2355   Total Protein 7.4 7.4 7.6 7.4 7.6   Albumin 3.3* 3.3* 3.4* 3.2* 3.2*   ALT 31 33 32 35 35   AST 54* 52* 53* 55* 51*   Alk Phos 759* 794* 839* 829* 819*   Bilirubin,Total 0.4 0.4 0.5 0.4 0.5       Micro:   1/25: Intraoperative bile: GS 0 pmns, no organisms, Cx: no growth  2/12: 1 set of blood cultures from the right Mediport (no peripheral bld was sent): positive for Enterobacter  cloacae complex in 70 hrs, sensitive to zosyn.   2/18 abdominal GS had >25 PMNs, many GPC in pairs/chains, many GNB and cultures 4+ Enterobacter cloacae complex. The cultures also grew 1+ Candida glabrata   2/21: BCx from the Right peripheral IV, Mediport, L IJ:  No growth  05/03/17: IR-guided I&D with drain placement of the left upper quadrant collection: GS > 25 PMNs, many GNB and GN diplococci, Cultures with 4+ Enterobacter cloacae complex (resistant to Zosyn and CTX), C. Albicans and C. Glabrata, and Candida tropicalis  05/04/17:    1 set of blood cultures from left IJ: VRE at 15.3 TTP. Daptomycin sensitivities: MIC 35mg/ml (dose dependent),  Linezolid   1 set from periphery: VRE and Enterobacter cloacae at 14.8 TTP   05/05/17:  IR-guided I&D with drain placement of the intrahepatic collection: GS 1-10 PMNs, very few GPC in pairs. Fungal cultures with Candida glabrata  05/05/17: Blood cultures from periphery, Mediport and IJ: no growth  05/04/17: Urine cultures obtained (for unclear reason): no growth  05/08/17: Abscess GS: >25 PMNs, many GPC in chains, few GNB. Aerobic cultures growing 2+ Enterobacter (ertapenem, Cipro, TMP/SMX-sensitive) and 3+VRE, Fungal: C. Glabrata (sens to caspo, vori and dose dependent sens to fluconazole)  06/06/17: 2 sets of BCx (1 set each from periphery and Mediport) - taken after restarting ertapenem: IVAD grew VRE ttp 18.2 hours,  Periphery (Left arm) grew VRE ttp 17.3.  06/07/17 Pleural fluid (left) GS 1-10 PMN's, 1-10 Nucleated white cells, No organisms seen and 1604 nucleated cells. No growth on culture.   06/07/17 Abdominal abscess: labs cancelled - no specimens received.   06/08/17: Mediport BC + for VRE in 23 hrs (R-Amp, R- tetracycline per lab, suppressed result, R- doxycycline;  S-Linezolid, S-Dapto dose dependent),  Right hand peripheral cx: No growth  06/10/17: Blood culture 2/2: 1 periphery (RHand), 1 from Mediport before removed: no growth  06/11/17: Blood Culture 2/2 (both periphery) - no  growth   06/19/17:  Perigastric fluid #1:  GS 10-25 PMNs -  GPB, GNB, GPC in pairs. 1+ C. Glabrata (R- fluconazole, voriconazole, S- Caspofungin); 2+ Enterococcus faecium (R- amp, PenG, Vanco; S- Linezolid)  06/19/17:  Liver fluid collection:  GS: 0 PMNs, Fungal and AFB stains negative, 1+ enterococcus faecium (R- Amp, PenG, Vanco, S- Dapto dose dependent, Linezolid)  06/19/17 : Perigastric fluid #2: GS: >25 PMNs, Gram negative bacilli; 3+ C. Glabrata.   07/04/17 U/A negative  07/04/17 Blood Cultures 3/3 (1 set PICC proximal port, 2 sets periphery): NGTD  07/04/17 MRSA amplification Nares: negative  07/05/17 Blood Culture 3/3 (2 PICC, 1 Periph) - NGTD  07/05/17 Liver Abscess (IR drain)- <1PMNs, GPC in prs and chains, 1 colony candida albicans (R- fluconazole, S-Caspo, vori)), 4+ Enterococcus faecium - two isolates with one now resistant to Daptomycin, the other dose dependent.   5/24: BCx 2 sets 1 peripheral 1 PICC. Peripheral grew E.coli after 13 hrs (Pansensitive).  5/24: UA no WBCS  5/26: BCx 2 sets 1 peripheral 1 PICC - no growth  5/27 Sputum Gram Stain >10 squamous epithealial cells <25 PMNs - not cultured/contaminated  5/29: BCx 2 sets (1 set periph, 1 set PICC): no growth  5/29 Sputum - not collected   5/29 U/A negative   5/29 Stool negative for C.diff  5/31 throat culture negative for StrepA  6/7 Urine: U/A neg  6/7 Blood cultures 2 sets (1 perph/1 PICC) No growth  6/13 Stool POS for  H.pylori antigen      Imaging/Other Relevant Diagnostics:   No new imaging for review    Assessment and Plan:  ID problem(s):   - Candida esophagitis (Dx 6/14, no cultures sent)  - H. Pylori ( 6/13, by stool positive antigen)  - E.coli bacteremia/aspiration pneumonia (5/24).  - s/p VRE Bacteremia (3/2, 4/4, 4/6)   - s/p enterobacter bacteremia (2/12, 3/2)  - s/p Multiple Intra-abdominal abscesses growing VRE, enterobacter, C. glabrata, C.albicans     72 yo female, s/p pancreatic CA, Whipple on 03/29/17 for cure, followed by a prolonged,  complicated hospitalization including delayed gastric emptying, malnutrion/anorexia, VRE bacteremia, liver and abdominal abscesses (VRE, enterobacter, C. glabrata and C. Albicans), Enterobacter cloacae bacteremia, E.coli bacteremia, Asp PNA. She has been on multiple antibiotics since January, with episodes of early sepsis on previous attempts to deescalate and withdraw antibiotics.  Obelia was troubled with continued early satiety as well as intermittent sudden vomiting episodes not preceded by nausea.  She went for an EGD on 6/14, impression from procedure: candida esophagitis and suspected gastroparesis.  No ulcers were seen but she had H.pylori positive antigen in stool.  We recommended Caspofungin for 14 days (14-21 days per IDSA guidelines) for esophageal candidiasis given previous candida glabrata and candida albicans (from intraabdominal collections) were resistant to fluconazole, also glabrata with resistance pattern to voriconazole.  She was also started on triple therapy for H.pylori (ampicillen, tetracycline, PPI) on 6/15, Augmentin stopped when amoxicillin started.       Amanda Galloway was doing very well today at the time of my visit.  She siting in a chair smiling and sitting up tall, appeared very happy, voicing she will be going home soon and she is feeling better than she has in a long time.  She is tolerating Caspofungin well and feels her GI issues are settling down. She  is denying N/V/D, F/C, SOB or cough, denies abdominal pain and even is denying pain around PEG tube today.  WOB on RA unlabored, she got out of chair on her own strength with me standing by and ambulated in her room.      Recommendations:    Continue IV Caspofungin 50 mg daily for 14 days (minimum duration per IDSA guidelines for candida esophagitis) depending on clinical progress (6/13-6/27).    H.pylori treatment per primary team   Follow weekly CBC with diff and CMP (following LFTs while on antifungal)     Thank you for asking the  ID Service to participate in your patient's care. At this time, we will sign off but remain available for any additional questions or concerns.  Please update [name] with any change in clinical status.     Discussed case and plan for sign off with ID attending, Dr. William Hamburger.     Lonzo Candy, ANP-BC  Infectious Diseases, Team 3  Pager [phone]  Office Ph [phone]

## 2017-08-19 NOTE — Progress Notes (Signed)
Palliative Care Progress Note    HPI: [name] w/ pancreatic CA s/p Whipple 1/25 (treatment for cure) w/ prolonged complicated hospitalization issues w/ infection, nutrition/appetite/nausea/constipation, s/p PEG w/ perf now resolved. Pt resumed methadone on 5/8 and weaned off her dilaudid PCA 5/23, most recent methadone increase on 6/6. All abd drains now removed. s/p PEG tube changed out & still issue of leaking so stopped TFs. Pt w/ acute episode of N/V on 6/13 warranting GI wu, s/p endoscopy 6/14 finding mild candida esophagitis, mild gastritis, gastroparesis and stool cx 6/13 + H.Pylori. GI rec'd low particle diet for gastroparesis. ID rec'd IV Caspo to treat candida d/t resistance to fluconazole in past, triple therapy for H pylori (ampicillin, tetracycline, PPI) & now hopeful for dispo to HOME w/ current trend of PT progress!    Subjective: "I'm back! I feel soo much better, I've been out walking and my appetite is back. I love getting dressed and they did my hair today. They told me I need to get a little stronger and I'll go home!"    Objective: chart reviewed, visited pt this afternoon when friend Kirsten visiting; Jackilyn was on a natural high soaking in the praise of staff and self pride for her amazing efforts over the [place] and today up ambulating (? 7x around unit so far); she had eaten ~ 75% of her dinner of chicken salad sandwich, cottage cheese, Ensure and denied abd pain, rating "O/10" pain; pt was thrilled to relay news to Probation officer and her dtr [name] who had just arrived back from [facility] that she was anticipating going home; Afrah was able to get self up out of recliner and showed off her 'skinny legs'; Darleene has not needed any prn dilaudid in 3 days!    discussed if PEG tube continues to leak around insertion site then try an ostomy bag over site to collect drainage and spare leakage onto skin or clothing    Palliative Care ROS:   Pain   Mild  Anorexia   Mild  Shortness of Breath    Mild  Tiredness/Fatigue   Mild  Drowsiness/Sleepiness   Mild  Constipation   no  Unable to Respond   no  Delirium   no   Last stool 6/15    Physical Examination:   BP: (115-130)/(60-80)   Temp:  [36 C (96.8 F)-37.1 C (98.8 F)]   Temp src: Temporal (06/17 1610)  Heart Rate:  [80-88]   Resp:  [16]   SpO2:  [93 %-98 %]   Gen appearance: older Caucasian female, sitting up in recliner, awake and smiling,denies current pain, states occasionally still bothersome drainage PEG insertion site; in very good spirits and excited about progress w/ PT with anticipated home dispo now  Lungs: easy resp, RA  Heart: reg  Abd: large, soft, PEG intact/clamped, bulky dsg around PEG intact current  Extrem: mild LEs, much improved  Skin:  Dry, ted stockings on LEs  Neuro: A+O x 3, smiling and in great spirits, reports she had ambulated x 7 so far today w/ walker around unit     Assessment/Plan:  [name] w/ pancreatic CA s/p Whipple (treatment for cure) w/ prolonged hospitalization. Pt has not had recurrent N/V in last 2 days, feeling great today with active appetite and mobility. Pt reports pain is currently controlled on current regimen of methadone + hasn't used any PO dilaudid prn in a few days. Pt excited about anticipated home dispo plan w/ current trend in mobility  and strength.    Pain/dyspnea  Dilaudid 8 mg po Q 2hrs prn (last x 2 on 6/14)   Lyrica 75 mg po BID for fibromyalgia (total 150 mg/d, increased 5/28)  Methadone 10 mg po TID (total 30 mg/d, increased last 6/6) last ECG on 5/29 w/ QTc 426  Albuterol neb Q 6 hrs prn (none used)  Lidocaine patch daily x 2 (abd, LUE)  Acetaminophen 650 mg po Q 6 hrs prn fever   Bengay cream 4x/d prn to joints (x 1 on 6/13)  Guaifenesin 600 mg po BID expectorant    Anxiety/agitation/nausea/fatigue  Cymbalta 20 mg po/d  Metoclopramide 10 mg po 4x/d AC  Pantoprazole 40 mg po BID  Ondansetron 4 mg IV Q 6 hrs prn (last x 2 on 6/15) -+daily at 10 pm (started 6/13)  Promethazine 12.5 mg IV Q 6  hrs prn (last x 2 on 6/13)  Ativan 0.25 mg IV Q 6 hrs prn (last x 1 on 6/15)     Prevention of opioid induced constipation, last stool 6/15  Bisacodyl supp pr daily (not taking or needed - would switch to PRN)  Miralax 17 gm po daily  Senna 2 tab po BID    Skin/oral care  Aquaphor prn   Eucerin prn for BLEs  Miconazole powder BID under breasts    GOC/prognosis  Full code  Dtr Rod Mae is HCP (very supportive and informed, former [facility] RN)  Pt feeling stronger and more encouraged, making huge strides in her recovery now hopeful for dispo home after few more sessions w/ PT    Pt case discussed w/ RNs.  We will continue to follow along. Writer is off for a few days. Please call if questions/concerns or if pt needs to be seen.    Total Time Spent 40 minutes:             >50% of time was spent in counseling and/or coordination of care.     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    Anemia D64.9    Hypothyroidism E03.9    Anemia D64.9     hx of Pulmonary embolism I26.99    Acute pulmonary insufficiency J98.4    Depression F32.9    Sepsis A41.9       No Known Allergies (drug, envir, food or latex)    Scheduled Meds:    DULoxetine  20 mg Oral QPM    metoclopramide  10 mg Oral 4x Daily AC & HS    methadone  10 mg Oral TID    caspofungin  50 mg Intravenous Q24H    tetracycline  500 mg Oral 4x Daily    amoxicillin  1,000 mg Oral 2 times per day    ondansetron  4 mg Intravenous Daily @ 2200    sodium chloride  1 g Per G Tube TID PC    lidocaine  1 patch Transdermal Q24H    pantoprazole  40 mg Oral BID AC    PROSOURCE NO CARB  30 mL Oral BID WC    pregabalin  75 mg Oral 2 times per day    lidocaine  1 patch Transdermal Q24H     furosemide  10 mg Oral BID    senna  2 tablet Oral 2 times per day    miconazole   Topical 2 times per day    guaiFENesin  600 mg Oral 2 times per day    levothyroxine  150 mcg Oral Daily @ 0600    enoxaparin  40 mg Subcutaneous Daily @ 2100    polyethylene glycol  17 g Oral Daily    lactobacillus rhamnosus (GG)  1 capsule Oral Daily    bisacodyl  10 mg Rectal Daily       Continuous Infusions:    lactated ringers 50 mL/hr (08/19/17 0627)       PRN Meds:  LORazepam, HYDROmorphone, sodium chloride, dextrose, naloxone, sodium chloride, dextrose, promethazine, ondansetron, albuterol, methyl salicylate-menthol, mineral oil-hydrophilic petrolatum, sodium chloride, sodium chloride

## 2017-08-19 NOTE — Consults (Signed)
Wound Consult Note, Initial    Patient Unit: [id number]    Amanda Galloway  72 y.o. year old female was referred by the patient's primary team for evaluation and treatment recommendations regarding: buttocks.   HPI, PMH/PSH, Labs, MAR reviewed.    Wound first identified/assessed on 08/17/17    Patient c/o:    []  Pain  []  Burning   []  Itching  []  Odor  [x]  No complaints at this time    Exam    Patient alert, awake, NAD. Stood up from the bed independently.    Wound #1:   Location: buttocks  Stage: n/a  Suspected underlying etiology/wound type: moisture/friction erosion  Wound bed:  Intact, slightly macerated tissue ridging  Drainage:  none  Edges:  n/a  Periwound skin:  intact   Odor after cleansing: None    Plan  Topical recommendations:     Apply thin layer of critic aid clear moisture barrier ointment daily and PRN with peri care.      Thank you very much for consult.  Wound Nurse will sign off.  Please call with any questions or concerns.    Francia Greaves RN, [facility]  Pager [phone]

## 2017-08-19 NOTE — Progress Notes (Signed)
SPIRITUAL ASSESSMENT NOTE    Unit chaplain follow-up.  Patient reports this as "a good day." Very hopeful and grateful for her current progress. Reflected upon source of strength during the "valleys" that have brought her to this "good day." Ended in prayer of praise and thanksgiving.    Identified Religion: Christian     Spiritual Concern: Other (comment) (anticipating discharge)  Spiritual Practices: Writer, Music, Prayer     Patient Coping: Hopeful/ optimistic, Open discussion  Family Coping: Accepting, Hopeful/ optimistic  Chaplain Interventions: Helped pt. maintain/find hope, Prayer    Plan of Care:  Unit chaplain to visit again if additional spiritual support needed.    Shirlean Kelly      2:29 PM on 08/19/2017

## 2017-08-19 NOTE — Progress Notes (Addendum)
Surgical Oncology Surgery Progress Note    Patient: Amanda Galloway    LOS: 143 days    Attending: [place]. AFVSS.  Denies pain.  Reports no emesis overnight or nausea this AM.  Low PO intake, passing flatus and BMs.  Ambulating.       OBJECTIVE  Physical Exam:  Temp:  [36.2 C (97.2 F)-37.1 C (98.8 F)] 37.1 C (98.8 F)  Heart Rate:  [76-89] 82  Resp:  [16] 16  BP: (115-130)/(60-80) 120/70     GEN: no apparent distress  HEENT: NCAT, face symmetric  CHEST: nonlabored respirations  ABD: soft, mildly distended, tender surrounding G-tube that is CDI, minimal erythema, without active leaking   NEURO/MOTOR: alert, appropriate  EXTREMITIES: atraumatic  VASCULAR: feet wwp, no edema    Intake/Output:  06/16 0700 - 06/17 0659  In: 1785.5 [P.O.:260; I.V.:1215.4]  Out: 75      Medications:  Current Facility-Administered Medications   Medication    metoclopramide (REGLAN) tablet 10 mg    Lactated Ringers Infusion    LORazepam (ATIVAN) tablet 0.25 mg    HYDROmorphone (DILAUDID) tablet 8 mg    sodium chloride 0.9 % FLUSH REQUIRED IF PATIENT HAS IV    dextrose 5 % FLUSH REQUIRED IF PATIENT HAS IV    methadone (DOLOPHINE) tablet 10 mg    caspofungin (CANCIDAS) 50 mg in sodium chloride 0.9% 100 mL mini-bag    naloxone (NARCAN) 0.4 mg/mL injection 0.1 mg    tetracycline (ACHROMYCIN,SUMYCIN) capsule 500 mg    amoxicillin (AMOXIL) capsule 1,000 mg    ondansetron (ZOFRAN) injection 4 mg    sodium chloride tablet 1 g    sodium chloride 0.9 % FLUSH REQUIRED IF PATIENT HAS IV    dextrose 5 % FLUSH REQUIRED IF PATIENT HAS IV    lidocaine (LIDODERM) 5 % patch 1 patch    pantoprazole (PROTONIX) EC tablet 40 mg    PROSOURCE NO CARB liquid LIQD 30 mL    pregabalin (LYRICA) capsule 75 mg    lidocaine (LIDODERM) 5 % patch 1 patch    furosemide (LASIX) tablet 10 mg    senna (SENOKOT) tablet 2 tablet    promethazine (PHENERGAN) 12.5 mg in sodium chloride 0.9% 25 mL IVPB     miconazole (MICATIN) 2 % powder    ondansetron (ZOFRAN) injection 4 mg    guaiFENesin (MUCINEX) 12 hr tablet 600 mg    levothyroxine (SYNTHROID, LEVOTHROID) tablet 150 mcg    enoxaparin (LOVENOX) injection 40 mg    albuterol (PROVENTIL) nebulization 2.5 mg    polyethylene glycol (GLYCOLAX,MIRALAX) powder 17 g    lactobacillus rhamnosus (GG) (CULTURELLE) capsule 1 each    methyl salicylate-menthol (BENGAY) topical cream    bisacodyl (DULCOLAX) suppository 10 mg    DULoxetine (CYMBALTA) DR capsule 20 mg    mineral oil-hydrophilic petrolatum (AQUAPHOR) ointment    sodium chloride 0.9 % flush 10 mL    sodium chloride 0.9 % flush 10 mL     Facility-Administered Medications Ordered in Other Encounters   Medication    etomidate (AMIDATE) 2 mg/mL injection    rocuronium (ZEMURON) 10 mg/mL injection         Laboratory values:   Recent Labs      08/18/17   0007  08/17/17   0010   WBC  6.9  9.3   Hemoglobin  7.8*  8.1*   Hematocrit  24*  25*   Platelets  333  320     No components found with this basename: APTT, PT Recent Labs      08/18/17   0007  08/17/17   0010   Sodium  135  134   Potassium  3.7  4.2   Chloride  97  96   CO2  24  24   UN  17  11   Creatinine  0.61  0.63   Glucose  101*  105*   Calcium  8.8  8.6    Recent Labs      08/18/17   0007  08/17/17   0010   AST  54*  52*   ALT  31  33   Alk Phos  759*  794*   Bilirubin,Total  0.4  0.4     Recent Labs      08/19/17   0019  08/18/17   0007  08/17/17   0010   Total Protein   --   7.4  7.4   Albumin   --   3.3*  3.3*   Prealbumin  11*   --    --      No results for input(s): AMY, LIP in the last 72 hours.   GLUCOSE:   No results for input(s): PGLU in the last 72 hours.  Imaging: No results found.     ASSESSMENT  Amanda Galloway is a 72 y.o. female with h/o recent PE and pancreatic cancer POD # 102  status post whipple procedure complicated by delayed gastric emptying.  NGT replaced on 04/04/17. UGI on 04/12/17 concerning for obstruction just beyond the [place] in  the efferent limb.  Course complicated on 1/61/09 by rising LFTs and sepsis with CT concerning for cholangitis given biliary tree obstruction and PV compression due to hematoma and afferent limb distention in setting of PE treatment. Is s/p IR PTC on 04/16/17 and IR percutaneous aspiration of anterior abdominal fluid collection on 04/24/17. IR LUQ perc drain placement 3/1. IR anterior abdominal 34f perc drain placement 3/3. IR anterior perc drain into left hepatic collection 3/6. Developed E. Coli bacteremia on 07/26/17 presumed secondary to aspiration pneumonia.    PLAN  Continue Amoxicillin. PPI. Tetracycline for H.pylori treatment. IV Caspo for Candidal esophagitis  EGD on 6/14 without evidence of PUD. (+) candidal esophagitis.  Cont D3 diet. Encourage PO intake. Nutritional intake sub-optimal. Encourage PO today. May need to resume TF's if continuing to have PO intolerance. Cont Reglan QID  C/w aggressive pulmonary toilet, OOB/ambulate, PT/OT  Bowel regimen   Analgesia with PO pain. Appreciate palliative care assistance   DVT ppx - Lovenox   Dispo: [id number]   Author: DKathrin Greathouse MD as of: 08/19/2017  at: 7:02 AM         HPB-GI Attending Addendum:   I personally examined the patient, reviewed the notes, and discussed the plan of care with the residents and patient. Agree with detailed resident's note. Please see the note above for details of history, exam, labs, assessment/plan which reflect my input.      72year old female status post pancreaticoduodenectomy with complications. Malnutrition. Encouraging oral intake. Intermittent vomiting suggestive of delayed gastric emptying. Reglan on board. Small meals.   Holding Anemia. No need for transfusion. Iron daily.   Effusions. No need for treatment.   Abdominal pain. Methadone with prn narcotics.      EDelano Metz MD, FACS  Hepatobiliary, Pancreas & GI Surgery

## 2017-08-19 NOTE — Consults (Signed)
Medical Nutrition Therapy - Follow Up    Admit Date: 03/29/2017    Patient Summary: Per chart, 72 y.o. female with history of PE and pancreatic cancer s/p Whipple on 03/29/17. GI was initially consulted on 04/13/2017 for EGD for dilation of GOO. Our plan was to attempt EGD with dilation but the patient's course was complicated by rising LFTs and PV compression due a hematoma and afferent limb distention in the setting of PE treatment. She required an ICU stay with intubation for acute respiratory failure. She was eventually weaned from pressors and extubated on 2/17. She then developed severe constipation and then her course was further complicated by intraabdominal abscesses s/p IR drainage. Afterwards, she was found to have delayed gastric emptying. A PEG was placed by IR on 06/14/17 and she was tolerating tube feeds. Her PEG was exchanged on 08/09/17 after it was leaking.     Pertinent Meds: bowel regimen including daily bisacodyl, duloxetine, culturelle, levothyroxine, Lasix, Lyrica, Protonix, NaCL tablet, Zofran, caspofungin, methadone, tetracycline, amoxicillin, Reglan, LR @ 50 ml/hr, duloxetine  Pertinent Labs: 6/16 lytes WNL      Reviewed I/O's: 125 ml emesis noted yesterday     Enteral or parenteral access: PEG, PICC     Food allergies: NKFA    Current diet: dysphagia 3   Supplements: Ensure compact TID, 30 ml prosource BID      Nutrition Focused Physical Exam:    Edema: +1 BLE   Abdomen: obese, rounded, +BS, +flatus    Skin: erythema/redness    Anthropometrics:  Height: 170.2 cm (5\' 7" )    Current Weight: 74.1 kg (163 lb 4.8 oz) (6/11); 102% IBW   Ideal Body Weight: 72.4 kg + 10%  BMI: 25.6 kg/(m^2) normal range with edema noted   Weight Hx: weight change likely fluid related as pt has been with edema     08/13/2017 74.072 kg 163 lbs 5 oz Actual   07/14/2017 85.14 kg 187 lbs 11 oz Actual         Estimated Nutrient Needs: (Based on low weight of admission, 64.9 kg; close to being a dry weight)    1625-1950 kcal/day  (25-30 kcal/kg)   78-97 g protein/day (1.2-1.5 g/kg)    1625-1950 mL fluid/day (25-30 mL/kg) or per team       Nutrition Assessment and Diagnosis:   TFs d/c'd 6/12. Recent dx candidal esophagitis. Intermittent emesis, reglan QID. Per review of calorie counts pt meeting 32% of calorie and 36% of protein needs. Pt's RN reports she is eating much better today. Had toast with butter this AM and RN ordered cottage cheese for lunch. Writer and RN encouraged protein intake and encouraged pt to drink the Ensure compact. Pt started to eat when writer left room.     Calorie Counts:  Date Energy (kcal) Protein (g) Comments   6/11 538 28    6/12 867 55    6/13 132 0 Only ate applesauce    Average 512 28        Malnutrition Status:Pt diagnosed with moderate malnutrition 04/09/17.      Nutrition Intervention:   1. Continue dysphagia 3 diet, encourage po intake as tolerated   2. Continue Ensure compact TID and 30 ml ProSource BID   3. Would consider re-initiation of enteral feeds as pt allows. Recommend 50 ml TwoCal HN @ 50 ml/hr x 8 hours overnight with FWF 30 ml NS Q6H. Will provide 800 kcal, 33 g protein and 460 ml free water daily. Adjust FWF  prn based on PO fluid intake and fluid status   4. Continue current lab and weight monitoring parameters   5. Please monitor I/Os and % po intake- appreciate RN assistance     Nutrition Monitoring/Evaluation:   1. Will monitor diet tolerance and intake, TF tolerance, nutrition-related labs, weight trend, BM pattern, supplement acceptance.   2. Will follow up per high nutrition risk protocol.    Laurence Aly, [place]  Pager [phone]

## 2017-08-20 ENCOUNTER — Encounter: Payer: Medicare (Managed Care) | Admitting: Psychiatry

## 2017-08-20 ENCOUNTER — Other Ambulatory Visit: Payer: Self-pay | Admitting: Psychiatry

## 2017-08-20 DIAGNOSIS — I499 Cardiac arrhythmia, unspecified: Secondary | ICD-10-CM

## 2017-08-20 MED ORDER — FERROUS SULFATE 325 (65 FE) MG PO TABS *WRAPPED* *I*
325.0000 mg | ORAL_TABLET | Freq: Every day | ORAL | Status: DC
Start: 2017-08-21 — End: 2017-08-24
  Administered 2017-08-21 – 2017-08-23 (×3): 325 mg via ORAL
  Filled 2017-08-20 (×4): qty 1

## 2017-08-20 MED ORDER — SODIUM CHLORIDE 1 GM PO TABS *I*
1.0000 g | ORAL_TABLET | Freq: Three times a day (TID) | ORAL | 0 refills | Status: DC
Start: 2017-08-20 — End: 2017-09-25

## 2017-08-20 MED ORDER — ANIMAL SHAPES WITH C & FA PO CHEW *WRAPPED*
1.0000 | CHEWABLE_TABLET | Freq: Every day | ORAL | Status: DC
Start: 2017-08-20 — End: 2017-08-24
  Administered 2017-08-21 – 2017-08-24 (×4): 1 via NASOGASTRIC
  Filled 2017-08-20 (×6): qty 1

## 2017-08-20 MED ORDER — FUROSEMIDE 20 MG PO TABS *I*
20.0000 mg | ORAL_TABLET | Freq: Every day | ORAL | Status: DC
Start: 2017-08-21 — End: 2017-08-24
  Administered 2017-08-21 – 2017-08-24 (×4): 20 mg via ORAL
  Filled 2017-08-20 (×5): qty 1

## 2017-08-20 MED ORDER — PREGABALIN 75 MG PO CAPS *A*
75.0000 mg | ORAL_CAPSULE | Freq: Two times a day (BID) | ORAL | 0 refills | Status: DC
Start: 2017-08-20 — End: 2017-10-09

## 2017-08-20 MED ORDER — METOCLOPRAMIDE HCL 10 MG PO TABS *I*
10.0000 mg | ORAL_TABLET | Freq: Four times a day (QID) | ORAL | 0 refills | Status: DC
Start: 2017-08-20 — End: 2017-09-19

## 2017-08-20 MED ORDER — AMOXICILLIN-POT CLAVULANATE 875-125 MG PO TABS *I*
1.0000 | ORAL_TABLET | Freq: Two times a day (BID) | ORAL | Status: DC
Start: 2017-08-20 — End: 2017-08-24
  Administered 2017-08-20 – 2017-08-24 (×8): 1 via ORAL
  Filled 2017-08-20 (×12): qty 1

## 2017-08-20 MED ORDER — FERROUS SULFATE 325 (65 FE) MG PO TABS *WRAPPED* *I*
325.0000 mg | ORAL_TABLET | Freq: Every day | ORAL | 0 refills | Status: DC
Start: 2017-08-21 — End: 2017-11-18

## 2017-08-20 MED ORDER — ONDANSETRON HCL 4 MG PO TABS *I*
4.0000 mg | ORAL_TABLET | Freq: Four times a day (QID) | ORAL | Status: DC | PRN
Start: 2017-08-20 — End: 2017-08-24
  Administered 2017-08-20: 4 mg via ORAL
  Filled 2017-08-20: qty 1

## 2017-08-20 MED ORDER — ONDANSETRON 4 MG PO TBDP *I*
4.0000 mg | ORAL_TABLET | Freq: Three times a day (TID) | ORAL | 0 refills | Status: DC | PRN
Start: 2017-08-20 — End: 2017-09-25

## 2017-08-20 MED ORDER — LACTOBACILLUS RHAMNOSUS (GG) PO CAPS *I*
1.0000 | ORAL_CAPSULE | Freq: Every day | ORAL | 0 refills | Status: DC
Start: 2017-08-21 — End: 2017-09-25

## 2017-08-20 MED ORDER — PANTOPRAZOLE SODIUM 40 MG PO TBEC *I*
40.0000 mg | DELAYED_RELEASE_TABLET | Freq: Two times a day (BID) | ORAL | 0 refills | Status: DC
Start: 2017-08-20 — End: 2017-09-25

## 2017-08-20 MED ORDER — LEVOTHYROXINE SODIUM 150 MCG PO TABS *I*
150.0000 ug | ORAL_TABLET | Freq: Every day | ORAL | 0 refills | Status: AC
Start: 2017-08-20 — End: 2017-09-19

## 2017-08-20 MED ORDER — BISACODYL 10 MG RE SUPP *I*
10.0000 mg | Freq: Every day | RECTAL | Status: DC | PRN
Start: 2017-08-20 — End: 2017-08-24

## 2017-08-20 MED ORDER — FUROSEMIDE 20 MG PO TABS *I*
20.0000 mg | ORAL_TABLET | Freq: Every day | ORAL | 0 refills | Status: DC
Start: 2017-08-21 — End: 2017-09-02

## 2017-08-20 MED ORDER — LORAZEPAM 0.5 MG PO TABS *I*
0.2500 mg | ORAL_TABLET | Freq: Every evening | ORAL | 0 refills | Status: DC | PRN
Start: 2017-08-20 — End: 2017-11-08

## 2017-08-20 MED ORDER — AMOXICILLIN-POT CLAVULANATE 875-125 MG PO TABS *I*
1.0000 | ORAL_TABLET | Freq: Two times a day (BID) | ORAL | 0 refills | Status: DC
Start: 2017-08-20 — End: 2017-08-23

## 2017-08-20 MED ORDER — FERROUS SULFATE 325 (65 FE) MG PO TABS *WRAPPED* *I*
325.0000 mg | ORAL_TABLET | Freq: Every day | ORAL | Status: DC
Start: 2017-08-21 — End: 2017-08-20

## 2017-08-20 MED ORDER — PROSOURCE NO CARB PO LIQD *I*
30.0000 mL | Freq: Two times a day (BID) | ORAL | Status: DC
Start: 2017-08-20 — End: 2018-01-20

## 2017-08-20 MED ORDER — DULOXETINE HCL 20 MG PO CPEP *I*
20.0000 mg | DELAYED_RELEASE_CAPSULE | Freq: Every evening | ORAL | 0 refills | Status: DC
Start: 2017-08-20 — End: 2017-10-09

## 2017-08-20 NOTE — Consults (Signed)
Medical Nutrition Therapy Brief Note:    While in hospital, recommend supplemental TF of Ensure Enlive 1.5 daily via G-tube after dinner. FWF 30 ml before and after feed. Will provide 350 kcal and 20 g protein. When at home, recommend 1 can TwoCal HN supplemental TF daily via G-tube after dinner with 30 ml FWF before and after feed. Will provide 475 kcal and 20 g protein. Recommend f/u appointment with [facility] for enteral/po adjustments as needed.  Call main [facility] number to schedule ([phone]).      Laurence Aly, [place]  Pager [phone]

## 2017-08-20 NOTE — Progress Notes (Signed)
Physical Therapy Treatment Note:       08/20/17 1430   PT Tracking   PT [place]   Visit Number   Visit Number [facility]) / Treatment Day [facility]) 9   Visit Details [facility])   Visit Type [facility]) Follow Up   Reason for visit [facility]) General   Precautions/Observations   Precautions used Yes   LDA Observation None   Fall Precautions General falls precautions   Pain Assessment   *Is the patient currently in pain? Denies   Cognition   Arousal/Alertness Appropriate responses to stimuli   Orientation Oriented to person;Oriented to place;Oriented to situation   Following Commands Follows simple commands without difficulty;Follows simple commands consistently   Bed Mobility   Bed mobility Not tested   Additional comments Pt up in recliner at start and end of session today.   Transfers   Transfers Tested   Sit to Stand Stand by assistance;1 person assist   Stand to sit Stand by assistance;1 person assist   Transfer Assistive Device none   Additional comments Pt up from edge of bed and recliner chair, relying on arm rest to assist but did not require hands on assist. Without use of BUE pt required minAx1 to complete from lower surface. Pt with no overt loss of balance upon intial standing, SBA provided for safety d/t mild unsteadiness. 5x sit to stand completed in ~12 sec which is close to age appropriate scoore.   Mobility   Mobility Tested   Gait Pattern Unsteady   Ambulation Assist Stand by;Contact guard;1 person assist   Ambulation Distance (Feet) 882ft   Ambulation Assistive Device None   Stairs Assistance Stand by;1 person assist   Stair Management Technique One rail;Alternating pattern;Forwards   Number of Stairs 16   Additional comments Pt ambulated with unsteady gait pattern. Writer cueing bilateral heel strike and reciprocal arm swing to assist with balance. work on dual tasking with gait, counting backwards from 50 or 100 from 2's, 3's, and 7's. Pt noted with mild unstediness with dual tasking but no overt loss of  balance. During gait training with experienced 2-3 mild LOB when distracted from interactions with others requiring CGA. Able to complete stairs without loss of balance or hands on assist.   Balance   Balance Tested   Sitting - Static Independent ;Unsupported   Standing - Static Standby assist;Unsupported   Standing - Dynamic Contact guard;Unsupported   Additional Comments Completed tandem walking forward/backward with CGA unsupported 2x37ft, completed side stepping with SBA 2x43ft. Pt able to complete 360 degree turns towards right with SBA and towards left with CGA. Pt able to obtain item from floor with CGA   PT AM-PAC Mobility   Turning over in bed? 4   Sitting down on and standing up from a chair with arms? 1   Moving from lying on back to sitting on the side of the bed? 4   Moving to and from a bed to a chair? 3   Need to walk in hospital room? 3   Climbing 3 - 5 steps with a railing? 3   Total Raw Score 18   Standardized Score 43.63   CMS 1-100% Score 47   Assessment   Brief Assessment Remains appropriate for skilled therapy   Problem List Impaired balance;Impaired ambulation;Impaired transfers;Impaired LE strength;Impaired stair navigation   Patient / Family Goal home at end of week   Additional Comments Pt continues to do well but requires SBA to CGA for mobility d/t  impaired balance. Session focus on ambulation and higher level balance with pt tolerating well. Anticipate home in 2-3 more visits.   Plan/Recommendation   Treatment Interventions Restorative PT   PT Frequency 3-5x/wk   Mobility Recommendations 1A without AD, please assist with walking 3-5x/day    Referral Recommendations OT;SW;Home care   Discharge Recommendations Anticipate return to prior living arrangement;Home PT;Intermittent supervision/assist   PT Discharge Equipment Recommended None   Assessment/Recommendations Reviewed With: Patient;Nursing;Family;Midlevel provider;Physician;Social Worker   Next PT Visit higher level balance,  ambulation and stairs   Time Calculation   PT Timed Codes 38  (FT x25; GT x13)   PT Untimed Codes 0   PT Unbilled Time 0   PT Total Treatment 38   Plan and Onset date   Plan of Care Date 08/18/17   Onset Date 03/29/17   Treatment Start Date 07/08/17     Rodena Piety, PT, DPT  Pager [phone]

## 2017-08-20 NOTE — Progress Notes (Signed)
SPIRITUAL ASSESSMENT NOTE    Unit chaplain follow-up in anticipation of discharge.  Met daughter and reflective listening about range of patient's feelings about future discharge.  Ended visit in prayers of gratitude and petition for God's continued presence.    Identified Religion: Christian     Spiritual Concern: Overwhelmed  Spiritual Practices: Writer, Prayer     Patient Coping: Anxious, Hopeful/ optimistic, Open discussion  Family Coping: Anxious, Hopeful/ optimistic, Open discussion  Chaplain Interventions: Helped pt. identify support system, Normalized feelings, Prayer, Reflective listening    Plan of Care:  Unit chaplain will visit again if available before discharge.    Shirlean Kelly      3:47 PM on 08/20/2017

## 2017-08-20 NOTE — Progress Notes (Addendum)
SW into see pt and daughter. PT worked with pt today and feel that within 2-3 more PT sessions pt will be able to go home.     Daughter in agreement with this plan. She is scheduled to restart work on 09/11/17. This would give her a few weeks to help her mom get settled and into a routine before she starts back to work. Pts son is coming to visit for a week in the middle of July. Daughter will speak to their church to see if they have anyone that could check in with her mom while she is working. Pt will also have home health services that will be involved in her home care plan. Anticipate discharge to home sometime by this weekend. Pt and daughter in agreement with this plan.     Care coordinator informed and will update home care agency in preparation for discharge home. Support and reassurance provided to pt and daughter with effect. SW also suggested life alert button for pt upon discharge. Daughter in agreement. Will continue to plan fo rsafe discharge. Placement office updated.

## 2017-08-20 NOTE — Progress Notes (Addendum)
Surgical Oncology Surgery Progress Note    Patient: Amanda Galloway    LOS: 144 days    Attending: [place]. AFVSS.  Denies pain.  Reports no emesis overnight or nausea this AM.  Low PO intake, passing flatus and BMs.  Ambulating.       OBJECTIVE  Physical Exam:  Temp:  [36 C (96.8 F)-36.6 C (97.9 F)] 36.1 C (97 F)  Heart Rate:  [76-88] 88  Resp:  [16] 16  BP: (110-124)/(60-74) 110/74     GEN: no apparent distress  HEENT: NCAT, face symmetric  CHEST: nonlabored respirations  ABD: soft, mildly distended, tender surrounding G-tube that is CDI, minimal erythema, without active leaking   NEURO/MOTOR: alert, appropriate  EXTREMITIES: atraumatic  VASCULAR: feet wwp, no edema    Intake/Output:  06/17 0700 - 06/18 0659  In: 2105.3 [P.O.:800; I.V.:1095.3]  Out: 0      Medications:  Current Facility-Administered Medications   Medication    DULoxetine (CYMBALTA) DR capsule 20 mg    metoclopramide (REGLAN) tablet 10 mg    LORazepam (ATIVAN) tablet 0.25 mg    HYDROmorphone (DILAUDID) tablet 8 mg    sodium chloride 0.9 % FLUSH REQUIRED IF PATIENT HAS IV    dextrose 5 % FLUSH REQUIRED IF PATIENT HAS IV    methadone (DOLOPHINE) tablet 10 mg    caspofungin (CANCIDAS) 50 mg in sodium chloride 0.9% 100 mL mini-bag    naloxone (NARCAN) 0.4 mg/mL injection 0.1 mg    tetracycline (ACHROMYCIN,SUMYCIN) capsule 500 mg    amoxicillin (AMOXIL) capsule 1,000 mg    ondansetron (ZOFRAN) injection 4 mg    sodium chloride tablet 1 g    sodium chloride 0.9 % FLUSH REQUIRED IF PATIENT HAS IV    dextrose 5 % FLUSH REQUIRED IF PATIENT HAS IV    lidocaine (LIDODERM) 5 % patch 1 patch    pantoprazole (PROTONIX) EC tablet 40 mg    PROSOURCE NO CARB liquid LIQD 30 mL    pregabalin (LYRICA) capsule 75 mg    lidocaine (LIDODERM) 5 % patch 1 patch    furosemide (LASIX) tablet 10 mg    senna (SENOKOT) tablet 2 tablet    promethazine (PHENERGAN) 12.5 mg in sodium chloride 0.9% 25 mL IVPB     miconazole (MICATIN) 2 % powder    ondansetron (ZOFRAN) injection 4 mg    guaiFENesin (MUCINEX) 12 hr tablet 600 mg    levothyroxine (SYNTHROID, LEVOTHROID) tablet 150 mcg    enoxaparin (LOVENOX) injection 40 mg    albuterol (PROVENTIL) nebulization 2.5 mg    polyethylene glycol (GLYCOLAX,MIRALAX) powder 17 g    lactobacillus rhamnosus (GG) (CULTURELLE) capsule 1 each    methyl salicylate-menthol (BENGAY) topical cream    bisacodyl (DULCOLAX) suppository 10 mg    mineral oil-hydrophilic petrolatum (AQUAPHOR) ointment    sodium chloride 0.9 % flush 10 mL    sodium chloride 0.9 % flush 10 mL     Facility-Administered Medications Ordered in Other Encounters   Medication    etomidate (AMIDATE) 2 mg/mL injection    rocuronium (ZEMURON) 10 mg/mL injection         Laboratory values:   Recent Labs      08/18/17   0007   WBC  6.9   Hemoglobin  7.8*   Hematocrit  24*   Platelets  333     No components found with this basename: APTT, PT Recent Labs  08/18/17   0007   Sodium  135   Potassium  3.7   Chloride  97   CO2  24   UN  17   Creatinine  0.61   Glucose  101*   Calcium  8.8    Recent Labs      08/18/17   0007   AST  54*   ALT  31   Alk Phos  759*   Bilirubin,Total  0.4     Recent Labs      08/19/17   0019  08/18/17   0007   Total Protein   --   7.4   Albumin   --   3.3*   Prealbumin  11*   --      No results for input(s): AMY, LIP in the last 72 hours.   GLUCOSE:   No results for input(s): PGLU in the last 72 hours.  Imaging: No results found.     ASSESSMENT  Amanda Galloway is a 72 y.o. female with h/o recent PE and pancreatic cancer POD # 102  status post whipple procedure complicated by delayed gastric emptying.  NGT replaced on 04/04/17. UGI on 04/12/17 concerning for obstruction just beyond the [place] in the efferent limb.  Course complicated on 8/92/11 by rising LFTs and sepsis with CT concerning for cholangitis given biliary tree obstruction and PV compression due to hematoma and afferent limb distention in  setting of PE treatment. Is s/p IR PTC on 04/16/17 and IR percutaneous aspiration of anterior abdominal fluid collection on 04/24/17. IR LUQ perc drain placement 3/1. IR anterior abdominal 54f perc drain placement 3/3. IR anterior perc drain into left hepatic collection 3/6. Developed E. Coli bacteremia on 07/26/17 presumed secondary to aspiration pneumonia.    PLAN  Continue Amoxicillin. PPI. Tetracycline for H.pylori treatment. IV Caspo for Candidal esophagitis while in patient  EGD on 6/14 without evidence of PUD. (+) candidal esophagitis.  Cont D3 diet. Encourage PO intake. Nutritional intake sub-optimal. Encourage PO today. May need to resume TF's if continuing to have PO intolerance. Cont Reglan QID  C/w aggressive pulmonary toilet, OOB/ambulate, PT/OT  Bowel regimen   Analgesia with PO pain. Appreciate palliative care assistance   DVT ppx - Lovenox   Dispo: [facility], aggressive dispo planning.    Author: DKathrin Greathouse MD as of: 08/20/2017  at: 10:05 AM         HPB-GI Attending Addendum:   I personally examined the patient, reviewed the notes, and discussed the plan of care with the residents and patient. Agree with detailed resident's note. Please see the note above for details of history, exam, labs, assessment/plan which reflect my input.      72year old female status post pancreaticoduodenectomy with complications.   Pancreatic cancer. Resected.   Malnutrition. Encouraging oral intake. May need to use gastrostomy tube TID for added calories.   Intermittent vomiting suggestive of delayed gastric emptying. Resolving.  Small meals. Reglan  Anemia. No need for transfusion. Iron daily.   Pleural Effusions. Stable No need for treatment.   Abdominal pain. Methadone with prn narcotics. Stable.   Physical debility with need for ongoing physical therapy.      EDelano Metz MD, FACS  Hepatobiliary, Pancreas & GI Surgery

## 2017-08-21 DIAGNOSIS — F3289 Other specified depressive episodes: Secondary | ICD-10-CM

## 2017-08-21 LAB — COMPREHENSIVE METABOLIC PANEL
ALT: 84 U/L — ABNORMAL HIGH (ref 0–35)
AST: 149 U/L — ABNORMAL HIGH (ref 0–35)
Albumin: 3.3 g/dL — ABNORMAL LOW (ref 3.5–5.2)
Alk Phos: 757 U/L — ABNORMAL HIGH (ref 35–105)
Anion Gap: 14 (ref 7–16)
Bilirubin,Total: 0.4 mg/dL (ref 0.0–1.2)
CO2: 24 mmol/L (ref 20–28)
Calcium: 8.8 mg/dL (ref 8.6–10.2)
Chloride: 97 mmol/L (ref 96–108)
Creatinine: 0.67 mg/dL (ref 0.51–0.95)
GFR,Black: 102 *
GFR,Caucasian: 89 *
Glucose: 107 mg/dL — ABNORMAL HIGH (ref 60–99)
Lab: 15 mg/dL (ref 6–20)
Potassium: 3.7 mmol/L (ref 3.3–5.1)
Sodium: 135 mmol/L (ref 133–145)
Total Protein: 7.3 g/dL (ref 6.3–7.7)

## 2017-08-21 LAB — EKG 12-LEAD
P: 38 deg
P: 60 deg
P: 45 deg
P: 59 deg
PR: 176 ms
PR: 176 ms
PR: 172 ms
PR: 172 ms
QRS: 38 deg
QRS: 46 deg
QRS: 51 deg
QRS: 44 deg
QRSD: 72 ms
QRSD: 78 ms
QRSD: 74 ms
QRSD: 74 ms
QT: 376 ms
QT: 380 ms
QT: 376 ms
QT: 388 ms
QTc: 439 ms
QTc: 433 ms
QTc: 439 ms
QTc: 445 ms
Rate: 82 {beats}/min
Rate: 78 {beats}/min
Rate: 82 {beats}/min
Rate: 79 {beats}/min
T: 45 deg
T: 54 deg
T: 54 deg
T: 40 deg

## 2017-08-21 LAB — CBC AND DIFFERENTIAL
Baso # K/uL: 0.1 10*3/uL (ref 0.0–0.1)
Basophil %: 1.2 %
Eos # K/uL: 0.4 10*3/uL (ref 0.0–0.4)
Eosinophil %: 6 %
Hematocrit: 26 % — ABNORMAL LOW (ref 34–45)
Hemoglobin: 8.5 g/dL — ABNORMAL LOW (ref 11.2–15.7)
IMM Granulocytes #: 0 10*3/uL
IMM Granulocytes: 0.3 %
Lymph # K/uL: 1.2 10*3/uL (ref 1.2–3.7)
Lymphocyte %: 20.9 %
MCH: 30 pg/cell (ref 26–32)
MCHC: 32 g/dL (ref 32–36)
MCV: 93 fL (ref 79–95)
Mono # K/uL: 0.7 10*3/uL (ref 0.2–0.9)
Monocyte %: 12.8 %
Neut # K/uL: 3.4 10*3/uL (ref 1.6–6.1)
Nucl RBC # K/uL: 0 10*3/uL (ref 0.0–0.0)
Nucl RBC %: 0 /100 WBC (ref 0.0–0.2)
Platelets: 422 10*3/uL — ABNORMAL HIGH (ref 160–370)
RBC: 2.8 MIL/uL — ABNORMAL LOW (ref 3.9–5.2)
RDW: 18.4 % — ABNORMAL HIGH (ref 11.7–14.4)
Seg Neut %: 58.8 %
WBC: 5.8 10*3/uL (ref 4.0–10.0)

## 2017-08-21 LAB — VITAMIN B12: Vitamin B12: >1440 pg/mL — ABNORMAL HIGH (ref 232–1245)

## 2017-08-21 LAB — FOLATE: Folate: >14 ng/mL (ref >=4.6)

## 2017-08-21 MED ORDER — HYDROMORPHONE HCL 4 MG PO TABS *I*
4.0000 mg | ORAL_TABLET | ORAL | Status: AC | PRN
Start: 2017-08-21 — End: 2017-08-23

## 2017-08-21 MED ORDER — METHADONE HCL 10 MG PO TABS *I*
10.0000 mg | ORAL_TABLET | Freq: Three times a day (TID) | ORAL | 0 refills | Status: AC
Start: 2017-08-21 — End: 2017-08-28

## 2017-08-21 MED ORDER — SYRINGE 50-60 ML 60 ML MISC
5 refills | Status: DC
Start: 2017-08-21 — End: 2017-09-25

## 2017-08-21 MED ORDER — HYDROMORPHONE HCL 4 MG PO TABS *I*
4.0000 mg | ORAL_TABLET | ORAL | 0 refills | Status: DC | PRN
Start: 2017-08-21 — End: 2017-09-25

## 2017-08-21 MED ORDER — TWOCAL HN PO LIQD *A*
ORAL | 5 refills | Status: DC
Start: 2017-08-21 — End: 2017-08-23

## 2017-08-21 NOTE — Progress Notes (Signed)
Palliative Care Progress Note  Brief HPI: Amanda Galloway is a [name] w/ pancreatic CA s/p Whipple 1/25 (treatment for cure) w/ prolonged complicated hospitalization issues w/ infection, nutrition/appetite/nausea/constipation, s/p PEG w/ perf now resolved. Pt resumed methadone on 5/8 and weaned off her dilaudid PCA 5/23, most recent methadone increase on 6/6. All abd drains now removed. s/p PEG tube changed out & still issue of leaking so stopped TFs. Pt w/ acute episode of N/V on 6/13 warranting GI wu, s/p endoscopy 6/14 finding mild candida esophagitis, mild gastritis, gastroparesis and stool cx 6/13 + H.Pylori. GI rec'd low particle diet for gastroparesis. ID rec'd IV Caspo to treat candida d/t resistance to fluconazole in past, triple therapy for H pylori (ampicillin, tetracycline, PPI) & now hopeful for dispo to HOME w/ current trend of PT progress.     Significant 24h Events: Seen yesterday with daughter Amanda Galloway at bedside. Yesterday very upbeat and euphoric.     Subjective: Sitting in chair and tearful and worried about " being herself again" and "worrying about the next problem".     Palliative Care ROS: Denies pain, shortness of breath , nausea, constipation, and diarrhea.     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    Anemia D64.9    Hypothyroidism E03.9    Anemia D64.9     hx of Pulmonary embolism I26.99    Acute pulmonary insufficiency J98.4    Depression F32.9    Sepsis A41.9       No Known Allergies (drug, envir, food or latex)    Scheduled Meds:    furosemide  20 mg Oral Daily    amoxicillin-clavulanate  1 tablet Oral 2 times per day    chewable multivitamin  1 tablet Per NG tube Daily    ferrous sulfate  325 mg Oral Daily    DULoxetine  20 mg Oral QPM    metoclopramide  10 mg Oral 4x Daily AC & HS    methadone  10 mg Oral TID    caspofungin  50 mg  Intravenous Q24H    tetracycline  500 mg Oral 4x Daily    ondansetron  4 mg Intravenous Daily @ 2200    sodium chloride  1 g Per G Tube TID PC    lidocaine  1 patch Transdermal Q24H    pantoprazole  40 mg Oral BID AC    PROSOURCE NO CARB  30 mL Oral BID WC    pregabalin  75 mg Oral 2 times per day    lidocaine  1 patch Transdermal Q24H    senna  2 tablet Oral 2 times per day    miconazole   Topical 2 times per day    guaiFENesin  600 mg Oral 2 times per day    levothyroxine  150 mcg Oral Daily @ 0600    enoxaparin  40 mg Subcutaneous Daily @ 2100    polyethylene glycol  17 g Oral Daily    lactobacillus rhamnosus (GG)  1 capsule Oral Daily     Continuous Infusions: n/a      PRN Meds:  bisacodyl, ondansetron, LORazepam, HYDROmorphone, sodium chloride, dextrose, naloxone, sodium chloride, dextrose, methyl salicylate-menthol, sodium chloride, sodium chloride    Past 24h PRN Usage:  None    Physical Examination:   BP: (110-124)/(60-72)   Temp:  [36  C (96.8 F)-37.1 C (98.8 F)]   Temp src: Temporal (06/19 1223)  Heart Rate:  [78-81]   Resp:  [16]   SpO2:  [94 %-98 %]   General appearance: Tearful but well appearing  Head: Sclera anicteric, MMM, hair braided  Lungs: respirations easy. CTA  Heart: S1, S2 RRR  Abdomen: soft NT, ND, G tube site benign, dsg D+I. Well healed abdomen.  Extremities: mild dependant edema bilateral LE  Neurologic: A+O x 3, mood somewhat somber.     Assessment/Plan:  Amanda Galloway is a 72 yo with pancreatic cancer s/p Whipple (treatment for cure) w prolonged hospitalization. Symptoms are well controlled but having some anxiety about recurring problems and being able to regain her independence and former self. We had a prolonged discussion with encouragement about taking one day at a time and enjoying daily accomplishments. She realizes she has come great lengths during this hospitalization and happy she has recovered as much as she has.     Pain  - consider slow taper of methadone as  outpatient (currently on 10 mg tid)  - now pain free, consider decrease dilaudid prn dose to 4 mg  - continue lyrica 75 mg bid    Anxiety/Agitation/depression  - continue cymbalta  - emotional support    Risk for Constipation, last BM 6/17  - likely to have looser stools now on augementin  - may need to taper bowel regime, for now continue miralx 17 gm every day, senna 2 tabs po bid    Dispo: home this weekend after completion of IV Caspo.    [name] ANP  Palliative Care Service  [facility] [phone]  08/21/2017 2:11 PM

## 2017-08-21 NOTE — Progress Notes (Signed)
Surgical Oncology Surgery Progress Note    Patient: Amanda Galloway    LOS: 145 days    Attending: Galka     INTERVAL HISTORY & SUBJECTIVE    NAEO. AFVSS. Resting comfortably this AM.  PO intake improving. Ambulating with PT, plan return to home in 2-3 sessions.       OBJECTIVE  Physical Exam:  Temp:  [36 °C (96.8 °F)-37.1 °C (98.8 °F)] 36 °C (96.8 °F)  Heart Rate:  [74-88] 80  Resp:  [16] 16  BP: (100-124)/(60-74) 120/60     GEN: no apparent distress  HEENT: NCAT, face symmetric  CHEST: nonlabored respirations  ABD: soft, mildly distended, tender surrounding G-tube that is CDI, minimal erythema, without active leaking   NEURO/MOTOR: alert, appropriate  EXTREMITIES: atraumatic  VASCULAR: feet wwp, no edema    Intake/Output:  06/18 0700 - 06/19 0659  In: 1062.7 [P.O.:600; I.V.:232.7]  Out: 0      Medications:  Current Facility-Administered Medications   Medication   • bisacodyl (DULCOLAX) suppository 10 mg   • ondansetron (ZOFRAN) tablet 4 mg   • furosemide (LASIX) tablet 20 mg   • amoxicillin-clavulanate (AUGMENTIN) 875-125 MG per tablet 1 tablet   • chewable multivitamin (FLINTSTONE ANIMAL SHAPES) chewable tablet 1 tablet   • ferrous sulfate tablet 325 mg   • DULoxetine (CYMBALTA) DR capsule 20 mg   • metoclopramide (REGLAN) tablet 10 mg   • LORazepam (ATIVAN) tablet 0.25 mg   • HYDROmorphone (DILAUDID) tablet 8 mg   • sodium chloride 0.9 % FLUSH REQUIRED IF PATIENT HAS IV   • dextrose 5 % FLUSH REQUIRED IF PATIENT HAS IV   • methadone (DOLOPHINE) tablet 10 mg   • caspofungin (CANCIDAS) 50 mg in sodium chloride 0.9% 100 mL mini-bag   • naloxone (NARCAN) 0.4 mg/mL injection 0.1 mg   • tetracycline (ACHROMYCIN,SUMYCIN) capsule 500 mg   • ondansetron (ZOFRAN) injection 4 mg   • sodium chloride tablet 1 g   • sodium chloride 0.9 % FLUSH REQUIRED IF PATIENT HAS IV   • dextrose 5 % FLUSH REQUIRED IF PATIENT HAS IV   • lidocaine (LIDODERM) 5 % patch 1 patch   • pantoprazole (PROTONIX) EC tablet 40 mg   • PROSOURCE NO  CARB liquid LIQD 30 mL   • pregabalin (LYRICA) capsule 75 mg   • lidocaine (LIDODERM) 5 % patch 1 patch   • senna (SENOKOT) tablet 2 tablet   • miconazole (MICATIN) 2 % powder   • guaiFENesin (MUCINEX) 12 hr tablet 600 mg   • levothyroxine (SYNTHROID, LEVOTHROID) tablet 150 mcg   • enoxaparin (LOVENOX) injection 40 mg   • polyethylene glycol (GLYCOLAX,MIRALAX) powder 17 g   • lactobacillus rhamnosus (GG) (CULTURELLE) capsule 1 each   • methyl salicylate-menthol (BENGAY) topical cream   • sodium chloride 0.9 % flush 10 mL   • sodium chloride 0.9 % flush 10 mL     Facility-Administered Medications Ordered in Other Encounters   Medication   • etomidate (AMIDATE) 2 mg/mL injection   • rocuronium (ZEMURON) 10 mg/mL injection         Laboratory values:   Recent Labs      08/21/17   0520   WBC  5.8   Hemoglobin  8.5*   Hematocrit  26*   Platelets  422*     No components found with this basename: APTT, PT Recent Labs      08/21/17   0520   Sodium    6681   Sodium  135   Potassium  3.7   Chloride  97   CO2  24   UN  15   Creatinine  0.67   Glucose  107*   Calcium  8.8    Recent Labs      08/21/17   0520   AST  149*   ALT  84*   Alk Phos  757*   Bilirubin,Total  0.4     Recent Labs      08/21/17   0520  08/19/17   0019   Total Protein  7.3   --    Albumin  3.3*   --    Prealbumin   --   11*     No results for input(s): AMY, LIP in the last 72 hours.   GLUCOSE:   No results for input(s): PGLU in the last 72 hours.  Imaging: No results found.     ASSESSMENT  Amanda Galloway is a 72 y.o. female with h/o recent PE and pancreatic cancer POD # 102  status post whipple procedure complicated by delayed gastric emptying.  NGT replaced on 04/04/17. UGI on 04/12/17 concerning for obstruction just beyond the [place] in the efferent limb.  Course complicated on 5/94/70 by rising LFTs and sepsis with CT concerning for cholangitis given biliary tree obstruction and PV compression due to hematoma and afferent limb distention in setting of PE treatment. Is s/p IR PTC  on 04/16/17 and IR percutaneous aspiration of anterior abdominal fluid collection on 04/24/17. IR LUQ perc drain placement 3/1. IR anterior abdominal 30f perc drain placement 3/3. IR anterior perc drain into left hepatic collection 3/6. Developed E. Coli bacteremia on 07/26/17 presumed secondary to aspiration pneumonia.    PLAN  Augmentin  PPI. Tetracycline for H.pylori treatment. IV Caspo(ends Friday) for Candidal esophagitis while in patient  EGD on 6/14 without evidence of PUD. (+) candidal esophagitis.  Cont D3 diet. Encourage PO intake. Nutritional intake sub-optimal. Encourage PO today. May need to resume TF's if continuing to have PO intolerance. Cont Reglan QID  C/w aggressive pulmonary toilet, OOB/ambulate, PT/OT  Bowel regimen   Analgesia with PO pain. Appreciate palliative care assistance   DVT ppx - Lovenox   Dispo: [facility],  dispo planning.    Author: MMelburn Hake MD as of: 08/21/2017  at: 7:30 AM

## 2017-08-21 NOTE — Progress Notes (Signed)
SPIRITUAL ASSESSMENT NOTE    Unit chaplain stopped by for follow-up visit.  Patient reports being happy about upcoming discharge but "scared."  She has been in the hospital for the better part of the year and this is a big transition.  Listened to her worries and joined with her in prayer.  Helped identify spiritual resources and scripture, encouraging her to draw on her many sources of strength.    Identified Religion: Christian     Spiritual Concern: Fearful, Overwhelmed  Spiritual Practices: Writer, Prayer     Patient Coping: Anxious, Fearful, Open discussion  Family Coping: Anxious, Fearful, Open discussion  Chaplain Interventions: Explored the meaning of a situation, Helped pt. maintain/find hope, Normalized feelings, Prayer, Reflective listening    Plan of Care:  Unit chaplain will visit again if patient has not been discharged.    Amanda Galloway      3:48 PM on 08/21/2017

## 2017-08-21 NOTE — Progress Notes (Signed)
Physical Therapy Treatment Note:       08/21/17 1430   PT [place]   Visit Number   Visit Number [facility]) / Treatment Day [facility]) 10   Visit Details [facility])   Visit Type [facility]) Follow Up   Reason for visit [facility]) General   Precautions/Observations   Precautions used Yes   LDA Observation None   Fall Precautions General falls precautions   Other Pt up in recliner chair, reports fatigue today but willing to do PT.   Pain Assessment   *Is the patient currently in pain? Denies   Additional comments Endorses fatigue   Cognition   Arousal/Alertness Appropriate responses to stimuli   Following Commands Follows simple commands without difficulty;Follows simple commands consistently   Additional Comments Writer cueing 2 mechanics of gait - heel strike and reciprocal arm swing. Pt was able to recall 2 items t/o sessio and at end of session to continue focusing on for previous ambulation to improve steadiness.   Bed Mobility   Bed mobility Not tested   Additional comments Previously demonsrtated independence.   Transfers   Transfers Tested   Sit to Stand Stand by assistance;1 person assist;Verbal cues   Stand to sit Stand by assistance;1 person assist;Verbal cues   Transfer Assistive Device none   Additional comments Pt completed x12 t/o. From recliner chair, pt stood without UE assist x5 with Probation officer providing minAx1 with focus on forward trunk flexion and weight shift to assist with transition to stand. Focus on BLE strengthening and eccentric control during descent. With UE assist from recliner, pt able to rise with SBA d/t mild unsteadiness during erect stand. Completed x5 from EOB without UE use for BLE strengthening, pt requried CGA to encourage forward weight shift. Able to stand with UE assist with SBA.   Mobility   Mobility Tested   Gait Pattern Unsteady   Ambulation Assist Stand by;1 person assist   Ambulation Distance (Feet) 2x445ft   Ambulation Assistive Device None   Stairs Assistance Not performed    Additional comments Pt ambulated at slow gait with continued mild unsteadiness but no need for hands on assist. SBA provided for safety d/t unsteadiness and need of increased VCs. Cues for heel strike and reciprocal arm swing to promote trunk rotation and improved balance. Pt continues to be easily distracted requiring SBA for safety.   Therapeutic Exercises   Additional comments Pt completed x20 heel rises without support. Writer enocuraged completing with SBA, pt experienced several LOB posteriorly with Probation officer providing cues for correction along with CGA to prevent further LOB but encouraging pt to self correct   Balance   Balance Tested   Sitting - Static Independent ;Unsupported   Standing - Static Standby assist;Unsupported   Standing - Dynamic Contact guard;Unsupported   Additional Comments Pt working on bilateral tandem stance x30 for 30 sec with CGA.   PT AM-PAC Mobility   Turning over in bed? 4   Sitting down on and standing up from a chair with arms? 1   Moving from lying on back to sitting on the side of the bed? 4   Moving to and from a bed to a chair? 3   Need to walk in hospital room? 3   Climbing 3 - 5 steps with a railing? 3   Total Raw Score 18   Standardized Score 43.63   CMS 1-100% Score 47   Assessment   Brief Assessment Remains appropriate for skilled therapy  Problem List Impaired balance;Impaired endurance;Impaired ambulation;Impaired transfers   Patient / Family Goal home on saturday   Plan/Recommendation   Treatment Interventions Restorative PT   PT Frequency 3-5x/wk   Mobility Recommendations SBA without AD, please assist with ambulation 4-5x/day   Referral Recommendations OT;SW;Home care   Discharge Recommendations Anticipate return to prior living arrangement;Home PT;Intermittent supervision/assist   PT Discharge Equipment Recommended None   Assessment/Recommendations Reviewed With: Patient;Nursing   Next PT Visit higher level balance, ambulation and stairs   Time Calculation   PT  Timed Codes 24   PT Untimed Codes 0   PT Unbilled Time 0   PT Total Treatment 24   Plan and Onset date   Plan of Care Date 08/18/17   Onset Date 03/29/17   Treatment Start Date 07/08/17     Rodena Piety, PT, DPT  Pager [phone]

## 2017-08-21 NOTE — Progress Notes (Signed)
08/21/17 1100   [facility] Patient Class Review   Patient Class Review Inpatient   Patient Class Effective 03/29/2017  Earnest Conroy RN   Utilization  Management  X [phone]  Page [phone]

## 2017-08-22 MED ORDER — CASPOFUNGIN 50 MG IN 110 ML NS MINI-BAG (0.5 MG/ML) *I*
50.0000 mg | INTRAVENOUS | Status: AC
Start: 2017-08-22 — End: 2017-08-22
  Administered 2017-08-22: 50 mg via INTRAVENOUS
  Filled 2017-08-22: qty 100

## 2017-08-22 MED ORDER — OSMOLITE 1.5 CAL PO LIQD *A*
1.0000 | Freq: Three times a day (TID) | ORAL | 0 refills | Status: DC
Start: 2017-08-22 — End: 2017-08-23

## 2017-08-22 MED ORDER — TETRACYCLINE HCL 500 MG PO CAPS *I*
500.0000 mg | ORAL_CAPSULE | Freq: Four times a day (QID) | ORAL | 0 refills | Status: AC
Start: 2017-08-22 — End: 2017-08-30

## 2017-08-22 NOTE — Progress Notes (Signed)
Massage Therapy Note / Inpatient       Today's Date: 08/22/2017    Location:  [facility]    Diagnosis: Pancreatic Adenocarcinoma    Subjective/chief complaint:  Patient reports feeling well. Patient reports being discharged Saturday and feeling "like a miracle". Patient requested a shoulder massage.    Objective: Patient was very pleasant. Patient was talkative. Patient's visible skin was intact.    Patients past history, current pertinent medical history, lab results, massage procedure, benefits and limitations were discussed with the patient as needed.    Treatment:   Intake and supportive care provided, including 15 minutes of massage therapy, using slow tempo and a pressure of 1 on the MacDonald Scale to all areas unless otherwise noted.     Areas addressed:   [] scalp [] face [x] neck [x] shoulders [] back [] arms [] hands [] upper legs [] lower legs [] feet     Positioning:   []  Prone []  Supine []  Right side-lying []  Left side-lying [x]  Seated upright in recliner     Positioning Support:   [] upper body incline [] head support [] knee support [] ankle support [] breast bolster  [] shoulder support [] port support [] legs elevated in recliner     All massage strokes appropriately adjusted for    []  Lymphedema risk     []  DVT     []  PE   [x]  Anticoagualant medication   []  Bone metastases   [x]  Medical devices   [x]  other    Sensitivity To Lotions: No     Patient response to treatment: "That felt so good"    Plan for future treatment / recommendations:   Continue comfort oriented massage addressing specific symptoms as indicated.    Brooke Bonito, 08/22/2017

## 2017-08-22 NOTE — Consults (Signed)
Medical Nutrition Therapy Brief Note:    Pt to d/c with enteral feeds as well as on a po diet. Would recommend 1 can of TwoCal HN BID (totalling 2 cans daily) for 950 kcal and 40 g protein. Min FWF 30 ml before and after each administration, may need to adjust FWF based on PO fluid intake.    Alternatively can give 1 carton Osmolite 1.5 TID (totalling 3 cartons daily) for 1065 kcal and 45 g protein. Min FWF 30 ml before and after each administration.     Both alternatives would meet >50% of estimated calorie and protein needs given suboptimal intakes.     Laurence Aly, [place]  Pager [phone]

## 2017-08-22 NOTE — Plan of Care (Signed)
Problem: Cognitive function  Goal: Cognitive function will be maintained  Outcome: Completed or Resolved Date Met: 08/22/17    Goal: Cognitive function will be maintained  Outcome: Completed or Resolved Date Met: 08/22/17    Goal: Cognitive function will be maintained or return to baseline  Outcome: Completed or Resolved Date Met: 08/22/17      Problem: Bowel Elimination  Goal: Elimination patterns are normal or improving  Outcome: Maintaining      Problem: Post-Operative Hemodynamic Stability  Goal: Maintain Hemodynamic Stability  Outcome: Maintaining      Problem: Post-Operative Complications  Goal: Prevent post-operative complications  Outcome: Maintaining      Problem: Fluid and Electrolyte Imbalance  Goal: Fluid and Electrolyte imbalance  Outcome: Maintaining

## 2017-08-22 NOTE — Progress Notes (Addendum)
Surgical Oncology Surgery Progress Note    Patient: Amanda Galloway    LOS: 146 days    Attending: Forbes. AFVSS. Denies f/c, n/v, CP, SOB or abdominal pain. Continues to do well with PT. Anticipating discharge to home this weekend.        OBJECTIVE  Physical Exam:  Temp:  [36.1 C (97 F)-36.4 C (97.5 F)] 36.2 C (97.2 F)  Heart Rate:  [68-87] 80  Resp:  [16] 16  BP: (108-122)/(60-68) 108/68     GEN: no apparent distress  HEENT: NCAT, face symmetric  CHEST: nonlabored respirations  ABD: soft, mildly distended, tender surrounding G-tube that is CDI, minimal erythema, without active leaking   NEURO/MOTOR: alert, appropriate  EXTREMITIES: atraumatic  VASCULAR: feet wwp, no edema    Intake/Output:  06/19 0700 - 06/20 0659  In: 610 [P.O.:240]  Out: -      Medications:  Current Facility-Administered Medications   Medication    HYDROmorphone (DILAUDID) tablet 4 mg    bisacodyl (DULCOLAX) suppository 10 mg    ondansetron (ZOFRAN) tablet 4 mg    furosemide (LASIX) tablet 20 mg    amoxicillin-clavulanate (AUGMENTIN) 875-125 MG per tablet 1 tablet    chewable multivitamin (FLINTSTONE ANIMAL SHAPES) chewable tablet 1 tablet    ferrous sulfate tablet 325 mg    DULoxetine (CYMBALTA) DR capsule 20 mg    metoclopramide (REGLAN) tablet 10 mg    LORazepam (ATIVAN) tablet 0.25 mg    sodium chloride 0.9 % FLUSH REQUIRED IF PATIENT HAS IV    dextrose 5 % FLUSH REQUIRED IF PATIENT HAS IV    methadone (DOLOPHINE) tablet 10 mg    caspofungin (CANCIDAS) 50 mg in sodium chloride 0.9% 100 mL mini-bag    naloxone (NARCAN) 0.4 mg/mL injection 0.1 mg    tetracycline (ACHROMYCIN,SUMYCIN) capsule 500 mg    ondansetron (ZOFRAN) injection 4 mg    sodium chloride tablet 1 g    sodium chloride 0.9 % FLUSH REQUIRED IF PATIENT HAS IV    dextrose 5 % FLUSH REQUIRED IF PATIENT HAS IV    lidocaine (LIDODERM) 5 % patch 1 patch    pantoprazole (PROTONIX) EC tablet 40 mg    PROSOURCE NO CARB  liquid LIQD 30 mL    pregabalin (LYRICA) capsule 75 mg    lidocaine (LIDODERM) 5 % patch 1 patch    senna (SENOKOT) tablet 2 tablet    miconazole (MICATIN) 2 % powder    guaiFENesin (MUCINEX) 12 hr tablet 600 mg    levothyroxine (SYNTHROID, LEVOTHROID) tablet 150 mcg    enoxaparin (LOVENOX) injection 40 mg    polyethylene glycol (GLYCOLAX,MIRALAX) powder 17 g    lactobacillus rhamnosus (GG) (CULTURELLE) capsule 1 each    methyl salicylate-menthol (BENGAY) topical cream    sodium chloride 0.9 % flush 10 mL    sodium chloride 0.9 % flush 10 mL     Facility-Administered Medications Ordered in Other Encounters   Medication    etomidate (AMIDATE) 2 mg/mL injection    rocuronium (ZEMURON) 10 mg/mL injection         Laboratory values:   Recent Labs      08/21/17   0520   WBC  5.8   Hemoglobin  8.5*   Hematocrit  26*   Platelets  422*     No components found with this basename: APTT, PT Recent Labs      08/21/17   0520   Sodium  135   Potassium  3.7   Chloride  97   CO2  24   UN  15   Creatinine  0.67   Glucose  107*   Calcium  8.8    Recent Labs      08/21/17   0520   AST  149*   ALT  84*   Alk Phos  757*   Bilirubin,Total  0.4     Recent Labs      08/21/17   0520   Total Protein  7.3   Albumin  3.3*     No results for input(s): AMY, LIP in the last 72 hours.   GLUCOSE:   No results for input(s): PGLU in the last 72 hours.  Imaging: No results found.     ASSESSMENT  Amanda Galloway is a 72 y.o. female with h/o recent PE and pancreatic cancer POD # 102  status post whipple procedure complicated by delayed gastric emptying.  NGT replaced on 04/04/17. UGI on 04/12/17 concerning for obstruction just beyond the [place] in the efferent limb.  Course complicated on 4/31/54 by rising LFTs and sepsis with CT concerning for cholangitis given biliary tree obstruction and PV compression due to hematoma and afferent limb distention in setting of PE treatment. Is s/p IR PTC on 04/16/17 and IR percutaneous aspiration of anterior  abdominal fluid collection on 04/24/17. IR LUQ perc drain placement 3/1. IR anterior abdominal 81f perc drain placement 3/3. IR anterior perc drain into left hepatic collection 3/6. Developed E. Coli bacteremia on 07/26/17 presumed secondary to aspiration pneumonia.    PLAN  Augmentin  PPI. Tetracycline for H.pylori treatment. IV Caspo(ends Friday) for Candidal esophagitis while in patient  EGD on 6/14 without evidence of PUD. (+) candidal esophagitis.  Cont D3 diet. Encourage PO intake. Nutritional intake sub-optimal. Encourage PO today. May need to resume TF's if continuing to have PO intolerance. Cont Reglan QID  C/w aggressive pulmonary toilet, OOB/ambulate, PT/OT  Bowel regimen   Analgesia with PO pain. Appreciate palliative care assistance   DVT ppx - Lovenox   Dispo: [facility],  dispo planning.    Author: MMelburn Hake MD as of: 08/22/2017  at: 9:12 AM       [facility]-GI Attending Addendum:   I personally examined the patient, reviewed the notes, and discussed the plan of care with the residents and patient. Agree with detailed resident's note. Please see the note above for details of history, exam, labs, assessment/plan which reflect my input.      72year old female status post pancreaticoduodenectomy with complications.   Pancreatic cancer. Resected.   Fungal esophagitis. Treated.   HPylori. Ongoing therapy.   Malnutrition. Encouraging oral intake. Recommend use of gastrostomy tube TID for added calories. Nutrition to comment.   Delayed gastric emptying. Stable with current Reglan regimen and Small meals.  Anemia. No need for transfusion. Iron daily.   Pleural Effusions. Stable No need for treatment.   Abdominal pain. Methadone with prn narcotics. Stable.   Physical debility with need for ongoing physical therapy.     Stable for discharge tomorrow.      EDelano Metz MD, FACS  Hepatobiliary, Pancreas & GI Surgery

## 2017-08-22 NOTE — Progress Notes (Signed)
Physical Therapy Treatment Note:     08/22/17 1600   PT Tracking   PT TRACKING PT Assigned   Visit Number   Visit Number [facility]) / Treatment Day [facility]) 11   Visit Details [facility])   Visit Type [facility]) Follow Up   Reason for visit [facility]) General   Precautions/Observations   Precautions used Yes   LDA Observation None   Fall Precautions General falls precautions   Pain Assessment   *Is the patient currently in pain? Denies   Cognition   Arousal/Alertness Appropriate responses to stimuli   Following Commands Follows simple commands without difficulty;Follows simple commands consistently   Additional Comments Pt able to recall cues of heel strike and reciprocal arm swing from previous session.   Bed Mobility   Bed mobility Tested   Supine to Sit Independent;Head of bed flat;Side rails down   Sit to Supine Independent;Side rails down;Head of bed flat   Additional comments Pt able to complete without difficulty.   Transfers   Transfers Tested   Sit to Stand Independent   Stand to sit Independent   Transfer Assistive Device none   Additional comments Pt up from edge of bed and recliner chair without overt loss of balance upon initial standing. Required use of BUE to assist with transfer.   Mobility   Mobility Tested   Gait Pattern Unsteady   Ambulation Assist Stand by;1 person assist   Ambulation Distance (Feet) 1x671ft   Ambulation Assistive Device None   Stairs Assistance Modified independent (device);Contact guard;1 person assist  (independent with railing; CGA without)   Stair Management Technique One rail;Alternating pattern;Forwards   Number of Stairs 6 x 4 steps   Additional comments Pt ambulated with demonstrating good carryover of reciprocal arm swing and heel strike. Pt with mild unsteadiness t/o, SBA provided for safety but no overt loss of balance, SBA provided for safety. Working towards dual tasking, with naming objects of different categories at different time sequencing. Pt able to name items without change of pace  and no overt loss of balance. Working on ambulation t/o room with opening/closing doors, change of directions, pt with no overt loss of balance. Pt complete stairs training, demonstrating independence with use of railing. Without railing, using step to gait pattern, cues for decreased speed for balance and safety. Pt required CGA to complete d/t impaired single leg stance. Pt losing balance posteriorly and laterally requriing min to CGA to correct.   Therapeutic Exercises   Additional comments Pt completed x10 bilateral bridges in supine for LE strength and glute strength   Balance   Balance Tested   Sitting - Static Independent ;Unsupported   Standing - Static Independent;Unsupported   Standing - Dynamic Contact guard;Unsupported   Additional Comments Worked on SLS bialterally 3x 60sec with CGA. Maintained tandem stance bilaterally for 30 sec. Woring on standing in narrow BOS with applying restistance in multiple directions at shoulder and pelvic girdles, pt maintaining balance against resistance, noted difficulty with applying resistance posterior to anterior and with writer letting go pt easily losing balance posteriorly.   PT AM-PAC Mobility   Turning over in bed? 4   Sitting down on and standing up from a chair with arms? 4   Moving from lying on back to sitting on the side of the bed? 4   Moving to and from a bed to a chair? 3   Need to walk in hospital room? 3   Climbing 3 - 5 steps with a railing? 3  Total Raw Score 21   Standardized Score 50.25   CMS 1-100% Score 29   Additional Comments   Additional comments Discussed with pt and SW of possible benefit from rollator walker for initial discharge home for safety during gait, discussed not anticipating long term need, reocmmending following up through [employer] and/or [employer].   Assessment   Brief Assessment Remains appropriate for skilled therapy   Problem List Impaired balance;Impaired LE strength;Impaired ambulation;Impaired stair navigation    Patient / Family Goal home tomorrow vs saturday   Plan/Recommendation   Treatment Interventions Restorative PT   PT Frequency 3-5x/wk   Mobility Recommendations SBA without AD   Referral Recommendations SW;Home care   Discharge Recommendations Anticipate return to prior living arrangement;Home PT;Intermittent supervision/assist   PT Discharge Equipment Recommended (possible rollator if unable to borrow one)   Assessment/Recommendations Reviewed With: Patient;Social Worker   Next PT Visit higher level balance, ambulation and stairs   Time Calculation   PT Timed Codes 33   PT Untimed Codes 0   PT Unbilled Time 0   PT Total Treatment 33   Plan and Onset date   Plan of Care Date 08/18/17   Onset Date 03/29/17   Treatment Start Date 07/08/17     Rodena Piety, PT, DPT  Pager [phone]

## 2017-08-23 ENCOUNTER — Telehealth: Payer: Self-pay

## 2017-08-23 ENCOUNTER — Other Ambulatory Visit: Payer: Self-pay | Admitting: Cardiology

## 2017-08-23 LAB — EKG 12-LEAD
P: 57 deg
PR: 180 ms
QRS: 43 deg
QRSD: 84 ms
QT: 400 ms
QTc: 450 ms
Rate: 76 {beats}/min
T: 46 deg

## 2017-08-23 MED ORDER — NYSTATIN 100000 UNIT/ML MT SUSP *I*
5.0000 mL | Freq: Four times a day (QID) | OROMUCOSAL | 0 refills | Status: DC
Start: 2017-08-23 — End: 2017-09-25

## 2017-08-23 MED ORDER — ROLLATOR MISC *A*
0 refills | Status: DC
Start: 2017-08-23 — End: 2018-04-30

## 2017-08-23 MED ORDER — TWOCAL HN PO LIQD *A*
ORAL | 5 refills | Status: DC
Start: 2017-08-23 — End: 2017-08-23

## 2017-08-23 MED ORDER — TWOCAL HN PO LIQD *A*
ORAL | 5 refills | Status: DC
Start: 2017-08-23 — End: 2017-09-25

## 2017-08-23 MED ORDER — AMOXICILLIN-POT CLAVULANATE 875-125 MG PO TABS *I*
1.0000 | ORAL_TABLET | Freq: Two times a day (BID) | ORAL | 0 refills | Status: DC
Start: 2017-08-23 — End: 2017-09-25

## 2017-08-23 MED ORDER — NYSTATIN 100000 UNIT/ML MT SUSP *I*
5.0000 mL | Freq: Four times a day (QID) | OROMUCOSAL | Status: DC
Start: 2017-08-23 — End: 2017-08-24
  Administered 2017-08-23 – 2017-08-24 (×3): 500000 [IU] via ORAL
  Filled 2017-08-23 (×8): qty 5

## 2017-08-23 NOTE — Discharge Instructions (Signed)
Brief Summary of your Hospital Stay:  You underwent a whipple procedure with Delano Metz, MD  on 03/29/17. You had a prolonged complicated hospital course. You had multiple infected abdominal fluid collections that were drained by Interventional Radiology. Infectious disease was consulted to assist with antibiotic management.  You had a PEG tube placed on 06/13/17 for added nutrition. You were eventually able to eat on your own, while keeping the PEG tube for added nutrition as needed.    Palliative was consulted for assistance with pain control, appetite stimulation, and anxiety.     You worked with physical therapy who recommended return to prior living arrangement, home PT and Intermittent supervision/assist. At the time of discharge you were ambulating independently, tolerating a regular diet with tube feeds as needed, and your pain was well controlled with oral pain medications.     Call Dr. Ron Agee @ [phone] for:   Fever of 101F. or greater  Shaking chills  Nausea and / or vomiting  Diarrhea  Uncontrolled pain  Not able to have a BM  Not able to pass gas  Signs and symptoms of dehydration: dry mouth, dry skin; decreased frequency or volume of urination; fast heart rate > 100 beats per minute, feeling your heart beating in your chest; lightheadedness, dizziness or headache. Please drink 1200 ml of fluid daily to prevent dehydration.  If you cannot reach the provider above, call the doctor's answering service.    Activity:    Continue activity as tolerated.    Continue to be out of bed at least 8 hours a day.    You should be walking at least 4 times daily. Stairs are encouraged.   Continue doing your deep breathing exercises to prevent pneumonia.    Do not lift greater than 10 lbs (a gallon of milk) for 6 weeks.    Please reserve questions regarding the appropriate timing of returning to specific activities after surgery for the Surgical Team at your follow up appointment.    Diet: Resume regular diet.    When at home, recommend 1 can TwoCal HN supplemental TF daily via G-tube twice per day with 30 ml FWF before and after feed.    Pain Management:   Please see medication list. You will have close follow up with palliative care for continued pain management upon discharge.       Medications   A list of medication changes has been included in your discharge packet. These medications were reviewed prior to your discharge. Please call the surgery office with concerns or contact your primary care provider for instructions on medications you were taking prior to coming in the hospital.        Follow up Appointments:  You will follow up with Dr. Ron Agee in 1 week from discharge.   You will also follow up with Palliative care as an outpatient.  You will have follow up with nutrition as an outpatient.   You will need to follow up with your PCP within one week for continued monitoring and to discuss any dietary or medication changes that were made while you were in the hospital.

## 2017-08-23 NOTE — Plan of Care (Signed)
Problem: Bowel Elimination  Goal: Elimination patterns are normal or improving  Outcome: Maintaining      Problem: Post-Operative Hemodynamic Stability  Goal: Maintain Hemodynamic Stability  Outcome: Maintaining      Problem: Fluid and Electrolyte Imbalance  Goal: Fluid and Electrolyte imbalance  Outcome: Maintaining

## 2017-08-23 NOTE — Progress Notes (Addendum)
Surgical Oncology Surgery Progress Note    Patient: Amanda Galloway    LOS: 147 days    Attending: [place]. AFVSS. Pain well controlled. PO intake improving. Denies f/c, n/v, CP, SOB or abdominal pain. Excited about going home tomorrow.       OBJECTIVE  Physical Exam:  Temp:  [35.9 C (96.6 F)-36.7 C (98.1 F)] 35.9 C (96.6 F)  Heart Rate:  [79-88] 88  Resp:  [14-16] 16  BP: (100-120)/(56-70) 110/70     GEN: no apparent distress  HEENT: NCAT, face symmetric  CHEST: nonlabored respirations  ABD: soft, mildly distended, nontender, G-tube CDI, , without active leaking   NEURO/MOTOR: alert, appropriate  EXTREMITIES: atraumatic  VASCULAR: feet wwp, no edema    Intake/Output:  06/20 0700 - 06/21 0659  In: 1462.8 [P.O.:850]  Out: -      Medications:  Current Facility-Administered Medications   Medication    HYDROmorphone (DILAUDID) tablet 4 mg    bisacodyl (DULCOLAX) suppository 10 mg    ondansetron (ZOFRAN) tablet 4 mg    furosemide (LASIX) tablet 20 mg    amoxicillin-clavulanate (AUGMENTIN) 875-125 MG per tablet 1 tablet    chewable multivitamin (FLINTSTONE ANIMAL SHAPES) chewable tablet 1 tablet    ferrous sulfate tablet 325 mg    DULoxetine (CYMBALTA) DR capsule 20 mg    metoclopramide (REGLAN) tablet 10 mg    LORazepam (ATIVAN) tablet 0.25 mg    sodium chloride 0.9 % FLUSH REQUIRED IF PATIENT HAS IV    dextrose 5 % FLUSH REQUIRED IF PATIENT HAS IV    methadone (DOLOPHINE) tablet 10 mg    naloxone (NARCAN) 0.4 mg/mL injection 0.1 mg    tetracycline (ACHROMYCIN,SUMYCIN) capsule 500 mg    ondansetron (ZOFRAN) injection 4 mg    sodium chloride tablet 1 g    sodium chloride 0.9 % FLUSH REQUIRED IF PATIENT HAS IV    dextrose 5 % FLUSH REQUIRED IF PATIENT HAS IV    lidocaine (LIDODERM) 5 % patch 1 patch    pantoprazole (PROTONIX) EC tablet 40 mg    PROSOURCE NO CARB liquid LIQD 30 mL    pregabalin (LYRICA) capsule 75 mg    lidocaine (LIDODERM) 5 % patch 1  patch    senna (SENOKOT) tablet 2 tablet    miconazole (MICATIN) 2 % powder    guaiFENesin (MUCINEX) 12 hr tablet 600 mg    levothyroxine (SYNTHROID, LEVOTHROID) tablet 150 mcg    enoxaparin (LOVENOX) injection 40 mg    polyethylene glycol (GLYCOLAX,MIRALAX) powder 17 g    lactobacillus rhamnosus (GG) (CULTURELLE) capsule 1 each    methyl salicylate-menthol (BENGAY) topical cream    sodium chloride 0.9 % flush 10 mL    sodium chloride 0.9 % flush 10 mL         Laboratory values:   Recent Labs      08/21/17   0520   WBC  5.8   Hemoglobin  8.5*   Hematocrit  26*   Platelets  422*     No components found with this basename: APTT, PT Recent Labs      08/21/17   0520   Sodium  135   Potassium  3.7   Chloride  97   CO2  24   UN  15   Creatinine  0.67   Glucose  107*   Calcium  8.8    Recent Labs      08/21/17  0520   AST  149*   ALT  84*   Alk Phos  757*   Bilirubin,Total  0.4     Recent Labs      08/21/17   0520   Total Protein  7.3   Albumin  3.3*     No results for input(s): AMY, LIP in the last 72 hours.   GLUCOSE:   No results for input(s): PGLU in the last 72 hours.  Imaging: No results found.     ASSESSMENT  Amanda Galloway is a 72 y.o. female with h/o recent PE and pancreatic cancer POD # 102  status post whipple procedure complicated by delayed gastric emptying.  NGT replaced on 04/04/17. UGI on 04/12/17 concerning for obstruction just beyond the [place] in the efferent limb.  Course complicated on 4/33/29 by rising LFTs and sepsis with CT concerning for cholangitis given biliary tree obstruction and PV compression due to hematoma and afferent limb distention in setting of PE treatment. Is s/p IR PTC on 04/16/17 and IR percutaneous aspiration of anterior abdominal fluid collection on 04/24/17. IR LUQ perc drain placement 3/1. IR anterior abdominal 65f perc drain placement 3/3. IR anterior perc drain into left hepatic collection 3/6. Developed E. Coli bacteremia on 07/26/17 presumed secondary to aspiration  pneumonia.    PLAN  Augmentin  PPI. Tetracycline for H.pylori treatment. IV Caspo(ends Friday) for Candidal esophagitis while in patient  EGD on 6/14 without evidence of PUD. (+) candidal esophagitis.  Cont D3 diet. Encourage PO intake. Nutritional intake sub-optimal. Encourage PO today. Cont Reglan QID  C/w aggressive pulmonary toilet, OOB/ambulate, PT/OT  Bowel regimen   Analgesia with PO pain. Appreciate palliative care assistance   DVT ppx - Lovenox   Dispo: [id number]  dispo tomorrow    Author: MMelburn Hake MD as of: 08/23/2017  at: 12:33 PM       HPB-GI Attending Addendum:   I personally examined the patient, reviewed the notes, and discussed the plan of care with the residents and patient. Agree with detailed resident's note. Please see the note above for details of history, exam, labs, assessment/plan which reflect my input.      72year old female status post pancreaticoduodenectomy with multiple complications.   Pancreatic cancer. Resected.   Fungal esophagitis. Treated.   HPylori. Ongoing therapy.   Malnutrition. Encouraging oral intake. Recommend use of gastrostomy tube TID for added calories. Boost or Ensure TID.   Delayed gastric emptying. Stable with current Reglan regimen and multiple small meals.  Anemia. No need for transfusion. Iron. Folate and B12 levels stable.   Pleural Effusions. Stable No need for treatment.   Abdominal pain. Methadone with prn narcotics. Stable.   Physical debility with need for ongoing physical therapy.     Stable for discharge tomorrow.      EDelano Metz MD, FACS  Hepatobiliary, Pancreas & GI Surgery

## 2017-08-23 NOTE — Progress Notes (Addendum)
SW into see pt. She is very excited to be going home tomorrow. Support and reassurance offered with effect.     Pt will have lifetime services upon discharge. SW informed FCM that pt is being discharged tomorrow. She will be up to do some paperwork with patient.Since she has transitioned to a home plan, [facility] recommended withdrawing the medicaid application. Pt in agreement.  In addition new Date [place] letter needed stating new disposition plan. This was completed and given to [facility], Lizzett Cordovia. Charity care application also completed by [employer]. Message left for daughter re: above.     Pt and daughter had no concerns re: discharge. Will continue to assist as needed and plan for safe discharge.     List of loan closets given to pt for future use. Pt requires rollator walker which pt says she has at home. Call placed to [facility] [phone]), message let to see if they have a rollator in stock, awaiting return call.

## 2017-08-23 NOTE — Plan of Care (Signed)
Problem: Safety  Goal: Patient will remain free of falls  Outcome: Maintaining     Problem: Mobility  Goal: Functional status is maintained or improved - Geriatric  Outcome: Maintaining

## 2017-08-23 NOTE — Progress Notes (Signed)
Palliative Care Progress Note    HPI:  Amanda Galloway is a [name] w/ pancreatic CA s/p Whipple 1/25 (treatment for cure) w/ prolonged complicated hospitalization issues w/ infection, nutrition/appetite/nausea/constipation, s/p PEG w/ perf now resolved. Pt resumed methadone on 5/8 and weaned off her dilaudid PCA 5/23, most recent methadone increase on 6/6. All abd drains now removed. s/p PEG tube changed out &still issue of leaking so stopped TFs. Pt w/ acute episode of N/V on 6/13 warranting GI wu, s/p endoscopy 6/14 finding mild candida esophagitis, mild gastritis, gastroparesis and stool cx 6/13 + H.Pylori. GI rec'd low particle diet for gastroparesis. s/p IV Caspo to treat candida d/t resistance to fluconazole in past & triple therapy for H pylori (ampicillin, tetracycline, PPI). After 71 day hospital admission, pt expected dispo to HOME tomorrow w/ planned FU outpt Palliative/Dr Brion Aliment to slowly wean down methadone.     Subjective: "I'm so excited to finally be going home. I'm ready to enjoy my independence again. I'm so thankful to everyone here for getting me through this"    Objective: chart reviewed, visited w/ pt and dtr [name] this afternoon, eagerly looking forward to dispo home tomorrow AM; reassured pt and dtr that [facility] team available for any questions - has appt set up w/ Dr Zandra Abts 7/23, goal to slowly wean off methadone, pt hasn't needed any prn dilaudid since 6/14, she denies pain at PEG site now and states drainage not as much, still using tube for supplements of Ensure; pt eating low particle diet and tolerating well, needs to stool, last was 6/18; pt and dtr VERY appreciative of all [facility] staff and providers    Palliative Care ROS:  Pain   Mild  Nausea   None  Anorexia   Mild  Anxiety   Mild  Shortness of Breath   None  Tiredness/Fatigue   Mild  Drowsiness/Sleepiness   Mild  Constipation   mild, last stool 6/18  Unable to Respond   no  Delirium   no  Last stool 6/18    Physical Examination:   BP:  (100-120)/(58-70)   Temp:  [35.9 C (96.6 F)-36.7 C (98.1 F)]   Temp src: Temporal (06/21 1140)  Heart Rate:  [79-88]   Resp:  [16]   SpO2:  [95 %-98 %]   Gen appearance: lovely older Caucasian female, sitting up in recliner, reports fibromyalgia pain worse in AM and improves during day as moving, no longer having pain around PEG, great spirits  Lungs: easy resp, RA  Heart: reg  Abd: large, soft, +BS, good appetite  Extrem: mild LE edema  Skin: ruddy red LE, dry and flaky   Neuro:  A+O x 3, ambulates w/ walker    Assessment/Plan:  [name] w/ pancreatic CA s/p Whipple (treatment for cure) in Jan w/ prolonged hospitalization. Pt has made significant strides in her rehab and recovery, good appetite and ambulating w/ walker. Pt w/ chronic fibromyalgia but otherwise symptoms well controlled on current med regimen and hasn't needed any prn dilaudid since 6/14. Pt anticipated dispo home tomorrow AM and will be followed outpt by Palliative for slow wean on methadone.    Pain/dyspnea  Lyrica 75 mg po BID for fibromyalgia (total 150 mg/d, increased 5/28)  Methadone 10 mg po TID (total 30 mg/d, increased last 6/6) last ECG on 5/29 w/ QTc 426  Albuterol neb Q 6 hrs prn (none used)  Lidocaine patch daily x 2 (abd, LUE)  Acetaminophen 650 mg po Q 6  hrs prn fever   Bengay cream 4x/d prn to joints (last x 1 on 6/13)  Guaifenesin 600 mg po BID expectorant    Anxiety/agitation/nausea/fatigue  Cymbalta 20 mg po/d  Metoclopramide 10 mg po 4x/d AC  Pantoprazole 40 mg po BID  Ondansetron 4 mg po Q 6 hrs prn (last x 1 on 6/18) + scheduled at 10 pm?  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 Pt has made significant strides in her rehab and recovery, good appetite and ambulating w/ walker. Pt w/ chronic fibromyalgia but otherwise symptoms well controlled on current med regimen and hasn't needed any prn dilaudid since 6/14. Pt anticipated dispo home tomorrow AM and will be followed outpt by Palliative for slow wean on methadone.    Pain/dyspnea  Lyrica 75 mg po BID for fibromyalgia (total 150 mg/d, increased 5/28)  Methadone 10 mg po TID (total 30 mg/d, increased last 6/6) last ECG on 5/29 w/ QTc 426  Albuterol neb Q 6 hrs prn (none used)  Lidocaine patch daily x 2 (abd, LUE)  Acetaminophen 650 mg po Q 6  hrs prn fever   Bengay cream 4x/d prn to joints (last x 1 on 6/13)  Guaifenesin 600 mg po BID expectorant    Anxiety/agitation/nausea/fatigue  Cymbalta 20 mg po/d  Metoclopramide 10 mg po 4x/d AC  Pantoprazole 40 mg po BID  Ondansetron 4 mg po Q 6 hrs prn (last x 1 on 6/18) + scheduled at 10 pm?  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  We will sign off. Please call if questions/concerns or if pt needs to be seen by Palliative tomorrow before discharge.    Total Time Spent 45 minutes:   >50% of time was spent in counseling and/or coordination of care.     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    Anemia D64.9    Hypothyroidism E03.9    Anemia D64.9     hx of Pulmonary embolism I26.99    Acute pulmonary insufficiency J98.4    Depression F32.9    Sepsis A41.9       No Known Allergies (drug, envir, food or latex)    Scheduled Meds:    nystatin  5 mL Oral 4x Daily    furosemide  20 mg Oral Daily    amoxicillin-clavulanate  1 tablet Oral 2 times per day    chewable multivitamin  1 tablet Per NG tube Daily    ferrous sulfate  325 mg Oral Daily    DULoxetine  20 mg Oral QPM    metoclopramide  10 mg Oral 4x Daily AC & HS    methadone  10 mg Oral TID    tetracycline  500 mg Oral 4x Daily    ondansetron  4 mg Intravenous Daily @ 2200    sodium chloride  1 g Per G Tube TID PC    lidocaine  1 patch Transdermal Q24H    pantoprazole  40 mg Oral BID AC    PROSOURCE NO CARB  30 mL Oral BID WC    pregabalin  75 mg Oral 2 times per day    lidocaine  1 patch Transdermal Q24H    senna  2 tablet Oral 2 times per day    miconazole   Topical 2 times per day    guaiFENesin  600 mg Oral 2 times per day    levothyroxine  150 mcg Oral Daily @ 0600    enoxaparin  40 mg Subcutaneous Daily @ 2100    polyethylene glycol  17 g Oral Daily    lactobacillus rhamnosus (GG)   1 capsule Oral Daily  Continuous Infu  We will sign off. Please call if questions/concerns or if pt needs to be seen by Palliative tomorrow before discharge.    Total Time Spent 45 minutes:   >50% of time was spent in counseling and/or coordination of care.     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    Anemia D64.9    Hypothyroidism E03.9    Anemia D64.9     hx of Pulmonary embolism I26.99    Acute pulmonary insufficiency J98.4    Depression F32.9    Sepsis A41.9       No Known Allergies (drug, envir, food or latex)    Scheduled Meds:    nystatin  5 mL Oral 4x Daily    furosemide  20 mg Oral Daily    amoxicillin-clavulanate  1 tablet Oral 2 times per day    chewable multivitamin  1 tablet Per NG tube Daily    ferrous sulfate  325 mg Oral Daily    DULoxetine  20 mg Oral QPM    metoclopramide  10 mg Oral 4x Daily AC & HS    methadone  10 mg Oral TID    tetracycline  500 mg Oral 4x Daily    ondansetron  4 mg Intravenous Daily @ 2200    sodium chloride  1 g Per G Tube TID PC    lidocaine  1 patch Transdermal Q24H    pantoprazole  40 mg Oral BID AC    PROSOURCE NO CARB  30 mL Oral BID WC    pregabalin  75 mg Oral 2 times per day    lidocaine  1 patch Transdermal Q24H    senna  2 tablet Oral 2 times per day    miconazole   Topical 2 times per day    guaiFENesin  600 mg Oral 2 times per day    levothyroxine  150 mcg Oral Daily @ 0600    enoxaparin  40 mg Subcutaneous Daily @ 2100    polyethylene glycol  17 g Oral Daily    lactobacillus rhamnosus (GG)   1 capsule Oral Daily  Continuous Infusions:       PRN Meds:  HYDROmorphone, bisacodyl, ondansetron, LORazepam, sodium chloride, dextrose, naloxone, sodium chloride, dextrose, methyl salicylate-menthol, sodium chloride, sodium chloride

## 2017-08-23 NOTE — Progress Notes (Signed)
Physical Therapy Treatment Note:       08/23/17 1530   PT [place]   Visit Number   Visit Number [facility]) / Treatment [facility]) 12   Visit Details [facility] [place])   Visit Type [facility]) Follow Up   Reason for visit [facility]) General   Precautions/Observations   Precautions used Yes   LDA Observation None   Fall Precautions General falls precautions   Other Plan for discharge tomorrow. Discussed wiht SW and CC about possible need of rollator walker. CC reported discussing with daughter and daughter requesting to hold order for script at this time, plan to return home without one and if necessary will pursue through loan closet.   Pain Assessment   *Is the patient currently in pain? Denies   Cognition   Arousal/Alertness Appropriate responses to stimuli   Following Commands Follows simple commands without difficulty;Follows simple commands consistently   Bed Mobility   Bed mobility Not tested   Additional comments previously demonstrated independence   Transfers   Transfers Tested   Sit to Stand Independent;Minimum ;1 person assist   Stand to sit Independent;Minimum ;1 person assist   Transfer Assistive Device rollator walker   Additional comments Writer reviewed safe brake management of rollator walker, pt able to complete without further VCs. With pt using UE to push into stand, able to complete demonstrating independence. Working on BLE strength with sit to stands without UE support. PT reuqired minAx1 to encouraged forward weight shift and pushing through feet to rise into standing. Writer providing assist for controlled descent for eccentric LE strenghtening, holding positions at different points to enocurage isometric strengthening.   Mobility   Mobility Tested   Gait Pattern (mild unsteadiness)   Ambulation Assist Stand by;Modified independent (device)  (SBA without AD; independent with rollator)   Ambulation Distance (Feet) 3x142ft   Ambulation Assistive Device None;rollator walker   Stairs Assistance  Minimal;Contact guard;1 person assist   Stair Management Technique No rail;Step to pattern;Forwards  (hand held assist)   Number of Stairs 4 x 4 steps   Additional comments Pt is improving with good heel strike and reciprocal arm swing assisting with normal gait mechanics and improving stbaility on feet, although continues with mild unsteadiness with intermittent sway requiring SBA for safety. With rollator pt demonstrated independence, working on navigation t/o room without LOB and able to avoid obstacles. Working on stairs without railings, pt with impaired SLS balance and eccentric control, requirng hand held assist for steps with CGA to minA at trunk, likely noted to lose balance posteriorly.   Therapeutic Exercises   Additional comments Working on sit to stands for BLe strengthening - see transfers.   Balance   Balance Tested   Sitting - Static Independent    Standing - Static Independent;Unsupported   Standing - Dynamic Independent;Supported   Additional Comments rolaltor walker to assist with external support   PT AM-PAC Mobility   Turning over in bed? 4   Sitting down on and standing up from a chair with arms? 4   Moving from lying on back to sitting on the side of the bed? 4   Moving to and from a bed to a chair? 4   Need to walk in hospital room? 4  (with AD)   Climbing 3 - 5 steps with a railing? 3   Total Raw Score 23   Standardized Score 5693   CMS 1-100% Score 11   Assessment   Brief Assessment Remains  appropriate for skilled therapy   Problem List Impaired LE strength;Impaired balance;Impaired ambulation;Impaired stair navigation   Patient / Family Goal home tomorrow   Plan/Recommendation   Treatment Interventions Restorative PT   PT Frequency 3-5x/wk   Mobility Recommendations SBA without AD   Referral Recommendations SW;Home care   Discharge Recommendations Anticipate return to prior living arrangement;Home PT;Intermittent supervision/assist   PT Discharge Equipment Recommended (daughter will obtain  via loan closet if needed)   Assessment/Recommendations Reviewed With: Patient;Nursing;Care coordinator;Social Worker   Next PT Visit higher level balance, ambulation and stairs and family training as needed   Time Calculation   PT Timed Codes 25   PT Untimed Codes 0   PT Unbilled Time 5   PT Total Treatment 25   Plan and Onset date   Plan of Care Date 08/23/17   Onset Date 03/29/17   Treatment Start Date 07/08/17     Rodena Piety, PT, DPT  Pager [phone]

## 2017-08-24 NOTE — Progress Notes (Signed)
Surgical Oncology Surgery Progress Note    Patient: Amanda Galloway    LOS: 148 days    Attending: [place]. AFVSS.   Pain well controlled. PO intake ok.   Denies f/c, n/v, CP, SOB or abdominal pain. Excited about discharge       OBJECTIVE  Physical Exam:  Temp:  [35.9 C (96.6 F)-36.6 C (97.9 F)] 36.6 C (97.9 F)  Heart Rate:  [79-88] 85  Resp:  [16] 16  BP: (100-122)/(56-80) 118/64     GEN: no apparent distress  HEENT: NCAT, face symmetric  CHEST: nonlabored respirations  ABD: soft, mildly distended, nontender, G-tube CDI, , without active leaking   NEURO/MOTOR: alert, appropriate  EXTREMITIES: atraumatic  VASCULAR: feet wwp, no edema    Intake/Output:  06/21 0700 - 06/22 0659  In: 680 [P.O.:480]  Out: -      Medications:  Current Facility-Administered Medications   Medication    nystatin (MYCOSTATIN) 100000 unit/mL suspension 500,000 Units    bisacodyl (DULCOLAX) suppository 10 mg    ondansetron (ZOFRAN) tablet 4 mg    furosemide (LASIX) tablet 20 mg    amoxicillin-clavulanate (AUGMENTIN) 875-125 MG per tablet 1 tablet    chewable multivitamin (FLINTSTONE ANIMAL SHAPES) chewable tablet 1 tablet    ferrous sulfate tablet 325 mg    DULoxetine (CYMBALTA) DR capsule 20 mg    metoclopramide (REGLAN) tablet 10 mg    LORazepam (ATIVAN) tablet 0.25 mg    sodium chloride 0.9 % FLUSH REQUIRED IF PATIENT HAS IV    dextrose 5 % FLUSH REQUIRED IF PATIENT HAS IV    methadone (DOLOPHINE) tablet 10 mg    naloxone (NARCAN) 0.4 mg/mL injection 0.1 mg    tetracycline (ACHROMYCIN,SUMYCIN) capsule 500 mg    ondansetron (ZOFRAN) injection 4 mg    sodium chloride tablet 1 g    sodium chloride 0.9 % FLUSH REQUIRED IF PATIENT HAS IV    dextrose 5 % FLUSH REQUIRED IF PATIENT HAS IV    lidocaine (LIDODERM) 5 % patch 1 patch    pantoprazole (PROTONIX) EC tablet 40 mg    PROSOURCE NO CARB liquid LIQD 30 mL    pregabalin (LYRICA) capsule 75 mg    lidocaine (LIDODERM) 5 %  patch 1 patch    senna (SENOKOT) tablet 2 tablet    miconazole (MICATIN) 2 % powder    guaiFENesin (MUCINEX) 12 hr tablet 600 mg    levothyroxine (SYNTHROID, LEVOTHROID) tablet 150 mcg    enoxaparin (LOVENOX) injection 40 mg    polyethylene glycol (GLYCOLAX,MIRALAX) powder 17 g    lactobacillus rhamnosus (GG) (CULTURELLE) capsule 1 each    methyl salicylate-menthol (BENGAY) topical cream    sodium chloride 0.9 % flush 10 mL    sodium chloride 0.9 % flush 10 mL         Laboratory values:   No results for input(s): WBC, HGB, HCT, PLT, INR, ESR, CRP in the last 72 hours.    No components found with this basename: APTT, PT No results for input(s): NA, K, CL, CO2, UN, CREAT, GLU, CA, MG, PO4 in the last 72 hours. No results for input(s): AST, ALT, ALK, TB, DB in the last 72 hours.  No results for input(s): TP, ALB, PALB in the last 72 hours.  No results for input(s): AMY, LIP in the last 72 hours.   GLUCOSE:   No results for input(s): PGLU in the last 72 hours.  Imaging: No results found.     ASSESSMENT  Amanda Galloway is a 72 y.o. female with h/o recent PE and pancreatic cancer POD # 102  status post whipple procedure complicated by delayed gastric emptying.  NGT replaced on 04/04/17. UGI on 04/12/17 concerning for obstruction just beyond the [place] in the efferent limb.  Course complicated on 4/56/25 by rising LFTs and sepsis with CT concerning for cholangitis given biliary tree obstruction and PV compression due to hematoma and afferent limb distention in setting of PE treatment. Is s/p IR PTC on 04/16/17 and IR percutaneous aspiration of anterior abdominal fluid collection on 04/24/17. IR LUQ perc drain placement 3/1. IR anterior abdominal 14f perc drain placement 3/3. IR anterior perc drain into left hepatic collection 3/6. Developed E. Coli bacteremia on 07/26/17 presumed secondary to aspiration pneumonia.    PLAN  Augmentin, PPI. Tetracycline for H.pylori treatment.   Cont D3 diet. Encourage PO intake. Cont Reglan  QID  C/w aggressive pulmonary toilet, OOB/ambulate, PT/OT  Bowel regimen   Analgesia with PO pain. Appreciate palliative care assistance   DVT ppx - Lovenox   Dispo: Discharge    Author: DKathrin Greathouse MD as of: 08/24/2017  at: 8:56 AM

## 2017-08-24 NOTE — Plan of Care (Signed)
Bowel Elimination     Elimination patterns are normal or improving Adequate for discharge        Fluid and Electrolyte Imbalance     Fluid and Electrolyte imbalance Adequate for discharge        GI Bleeding Elimination     Elimination of patterns are normal or improving Adequate for discharge        Mobility     Functional status is maintained or improved - Geriatric Adequate for discharge        Nutrition     Patient's nutritional status is maintained or improved Adequate for discharge     Nutritional status is maintained or improved - Geriatric Adequate for discharge        Post-Operative Bladder Elimination     Patient is able to empty bladder or return to baseline Adequate for discharge        Post-Operative Bowel Elimination     Elimination pattern is normal or improving Adequate for discharge        Post-Operative Complications     Prevent post-operative complications Adequate for discharge        Post-Operative Hemodynamic Stability     Maintain Hemodynamic Stability Adequate for discharge        Psychosocial     Demonstrates ability to cope with illness Adequate for discharge        Safety     Patient will remain free of falls Adequate for discharge

## 2017-08-24 NOTE — Progress Notes (Signed)
[facility] SOCIAL WORK  PHARMACY FORM     Todays date:  August 24, 2017    Patient Name: Amanda Galloway      Medical Record #: [id number]   DOB: Apr 12, 1945  Patients Address: [address]                Social Worker: Charlestine Massed, LMSW       Date of Service: August 24, 2017       Funding Source: [place]  ___________________________________________________________________    Pharmacy Information:  Date/time sent: August 24, 2017     Time needed: asap    Patient Location: Inpatient (specify unit)  [id number]    Medication Pick-up Preference: Patient will pick up at the pharmacy    Pharmacy Contact: musa  Social work signature: Charlestine Massed, LMSW  Supervisor/Manager Approval (if indicated):  Date:  (supervisor signature not required for Medicaid pending)

## 2017-08-24 NOTE — Progress Notes (Signed)
Removed PICC line, whole line was intact. Pt tolerated procedure well.     Estevan Oaks, MD

## 2017-08-25 NOTE — Discharge Summary (Signed)
Name: Amanda Galloway MRN: [id number] DOB: 03/09/1945     Admit Date: 03/29/2017       Patient was accepted for discharge to   Home or Self Care [1]           Discharge Attending Physician: Delano Metz, MD      Hospitalization Summary    CONCISE NARRATIVE:   Amanda Galloway is a 72 y.o. female with a history of malignant neoplasm of the pancreas who underwent a Whipple procedure with Dr. Delano Metz, MD on 03/29/17. She tolerated the procedure well. She was briefly admitted to SICU post operatively for hemodynamic monitoring and the expectation of complicated pain management given patient on home methadone. She was transferred to the floor on 1/28.  On 04/04/17, she developed nausea and vomiting and a NGT was placed with continued high output. Patient was started on TPN on 2/6. 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  On 2/12, patient developed tachypnea, mental status changes, and worsening transaminitis.  A CT ABD/pelvis demonstrated cholangitis given biliary tree obstruction and PV compression due to hematoma and afferent limb distention in setting of PE treatment. Patient had a PTC drain placed by IR and was transferred back to SICU for increased WOB and was intubated for worsening respiratory status. Patient became septic and hypotensive requiring multiple fluid boluses and was started on pressors. She was started on vanco and zosyn.   On 04/19/17, patient's hemodynamics were improved, pressors were weaned off and she was started on lasix gtt for diuresis. She was extubated on 2/17. On 2/18, CT abd/ pelvis showed persistent prominent pocket of fluid in the upper abdominal cavity.  Amanda Galloway is a 72 y.o. female with a history of malignant neoplasm of the pancreas who underwent a Whipple procedure with Dr. Delano Metz, MD on 03/29/17. She tolerated the procedure well. She was briefly admitted to SICU post operatively for hemodynamic monitoring and the  expectation of complicated pain management given patient on home methadone. She was transferred to the floor on 1/28.  On 04/04/17, she developed nausea and vomiting and a NGT was placed with continued high output. Patient was started on TPN on 2/6. 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  On 2/12, patient developed tachypnea, mental status changes, and worsening transaminitis.  A CT ABD/pelvis demonstrated cholangitis given biliary tree obstruction and PV compression due to hematoma and afferent limb distention in setting of PE treatment. Patient had a PTC drain placed by IR and was transferred back to SICU for increased WOB and was intubated for worsening respiratory status. Patient became septic and hypotensive requiring multiple fluid boluses and was started on pressors. She was started on vanco and zosyn.   On 04/19/17, patient's hemodynamics were improved, pressors were weaned off and she was started on lasix gtt for diuresis. She was extubated on 2/17. On 2/18, CT abd/ pelvis showed persistent prominent pocket of fluid in the upper abdominal cavity.  On 04/24/17, she underwent an IR percutaneous aspiration of anterior abdominal fluid collection on 04/24/17. Cultures were growing C. Glabrata. ID consulted, started on caspofungin. She was transferred to the floor on 04/27/17.   On 05/03/17, CT abd/pelvis showed interval increase size of a now large lobulated midline and left upper abdominal intrahepatic and extrahepatic air and fluid collection consistent with abscess which involves the liver's left lobe and left subdiaphragmatic space. IR placed a  LUQ perc drain placement that afternoon 05/03/17. On 3/3, IR placed anterior abdominal 68f perc drain. Repeat CT on 3/5, showed continued left hepatic collection. On 3/6, IR placed on anterior perc drain into left hepatic collection. Cultures were growing VRE and Enterobacter cloacae bacteremia (resistance  to zosyn) Patient was switched to ertapenem, daptomycin, caspo per ID. CT scan 3/21 with intra-abd collections, new portal venous collection. However, patient remained afebrile, clinically stable, WBCs downtrending, so no new drain at this time. Continue ertapenem/caspo/dapto per ID recommendations.  Patient had a CT scan on 4/4 showing increased left pleural effusion. IR placed 10Fr left sided chest tube placement on 06/07/17. It was removed 4/7 once output minimal and pleural effusion resolved. Patient also had mediport removed on 4/8 due to Mediport cultures grew VRE.   Patient continued with ongoing poor po intake, but was eventually tolerating full cyclic feeds via dubhoff tube. Multiple services consulted for PEG placement but no safe window for PEG placement. On 4/12, patient did have an open window for PEG tube and a tube was placed by IR. Patient develop quite a bit of pain at and around PEG insertion site after insertion. A CT abdomen on 4/16 increased hepatic abscess and fluid collection behind stomach. Patient was made NPO, placed on bowel rest and started on TPN on 4/19 for gastric perforation from the PEG tube. She underwent drain placements in hepatic and retrogastric fluid collections on 06/19/17. Infectious ID continued to follow and assist with antibiotic coverage.  Repeat  CT abd 4/26 showed resolution of gastric perforation, pt advanced her PO diet from clears.   She was readmitted to the SICU service on 5/2 due to concerns for recurrent sepsis (increased oxygen requirements, fever, tachycardia, and leukocytosis).  She was treated with broad spectrum antibiotics (linezolid, meropenem, and caspofungin). And underwent IR on 5/3 for hepatic abscess drain. Patient improved on antibiotics and source control, she was restarted on trickle TF and was transferred back to floor on 5/7.  CT abd/pelvis on 07/18/17, Stable intrahepatic fluid collection and slight decrease in the peripheral hepatic fluid  collection. On 5/24. Patient with worsening abdominal pain, confusion, and tachycardia. CTA chest negative for PE. CT A/P showed stable or decreased hepatic collections. Bilateral effusions improved, but atelectasis present. Blood cultures grew E. Coli presumed secondary to aspiration pneumonia. Patient started on ertapenem and continued on linezolid per ID. Patient improved on antibiotic therapy.  On 6/7, Patient underwent downsizing of PEG tube to 16 Fr due to peristomal leakage  Pt with acute episode of nausea and vomiting on 6/13 warranting GI consult. On 08/16/17, she underwent an EGD which showed mild candida esophagitis, mild gastritis, gastroparesis and stool cx 6/13 positive for H.Pylori.  ID recommended IV Caspo to treat candida due to resistance to fluconazole in past, triple therapy for H pylori. Patient's abdominal pain greatly improved after treatment was started. Patient to have follow up with GI as an outpatient.  Patient's hospital course was also been complicated by constipation, malnutrition, pain control, depression. Palliative care followed patient throughout her hospital stay to assist with pain management, anxiety, depression. Patient to go home on Lyrica, Cymbalta, methadone and PRN dilaudid.  Patient to have close follow up with Palliative for continued pain management.  Hospital course also complicated by fluid overload, requiring Lasix. Patient to do home with 2 weeks of oral Lasix. Patient's hospital course was also complicated by hyponatremia, patient will be discharged on sodium tablets and have weekly labs to monitor electrolytes. TSH also  drawn in work-up for hyponatremia, elevated at 20.58. Patient levothyroxine increased to 143mg. TSH on 5/25 down to 9.53. Will have another TSH/Free T4 drawn in July for continued monitoring.  All of the patient's drains were removed prior to going home. Her PICC line was removed as well. Patient's appetite began to increased and patient to go home  on regular diet with 2 cans of TwoCal for supplemental nutrition. She will have close follow up with nutrition outpatient.  On the day of discharge she was tolerating a regular diet with cans of tube feed for supplemental nutrition, her pain was managed with oral analgesics, she had return of bowel function and she was ambulating. Patient worked closely with PT and will continue home PT. Patient will follow up with Dr. GRon Ageeas an outpatient.     Thank you for allowing [facility] participate in the care of this patient.              OR PROCEDURE: Whipple procedure  OTHER PROCEDURES/FINDINGS:   2/12: IR placed of PTC drain  2/19: Successful CT-guided aspiration of a midline abdominal fluid collection with removal of 60 cc of purulent fluid.  3/1: Successful ultrasound-guided drainage of the left upper quadrant collection tracking across the midline into a left perihepatic collection. A 10 [place] catheter was placed.  3/3: Successful CT and ultrasound-guided drainage of an anterior mid abdominal collection inferior to a previously drained upper abdominal collection. A new 8 [place] was placed in the collection  3/6: IR drain placed in anterior abd in fluid collection abutting left hepatic lobe.  4/5: Successful left-sided thoracentesis with 60 cc of fluid removed and sent to the laboratory.   4/12:   Status post fluoroscopically guided placement of an 133French gastrostomy  (downsized to 16 Fr on 6/7)  4/18: Successful placement of a 10 [place] catheter drain into a right hepatic fluid collection. Successful placement of a 10 [place] catheter drain into the patient's retrogastric fluid collection.  5/3: Successful placement of an 8 [place] catheter into a right hepatic lobe biloma, as described above.           CT RESULTS:   2/4- 1. Status post Whipple procedure with postsurgical changes with atrophy and the body and the tail. There are several fluid collections in the surgical bed. These may represent  post-surgical changes. The possibility of an anastomotic leak is difficult to   assess at this point. Superimposed infection cannot be excluded but is less likely. Options for further evaluation include a delayed scan today (perhaps around 4 PM on 04/08/2017) or a short term follow-up scan in a few days to ensure complete resolution   of these collections.    2. Nodular contour of the liver raising the concern of cirrhosis.     6/4: 1. Interval decrease in size of intrahepatic and perihepatic fluid collections. No new collection.    2. Unchanged bilateral pleural effusions and diffuse body wall edema. Mesenteric misting/edema.    3. Status post Whipple procedure. Persistent pneumobilia.    4. Indwelling gastrostomy tube.             OTHER IMAGING RESULTS:   Upper GI Series 2/8:  1. At 5 hours 15 minutes post administration of barium, no barium is seen distal to the gastric remnant/efferent limb of small bowel. There is some contrast in the blind ending segment of small bowel near the gastric outlet. This is a pattern is likely  due to a gastric outlet obstruction at the efferent limb.     An additional delayed radiograph was scheduled to be taken at 8:00 PM this evening    2. A sliding hiatal hernia was also noted. Gastroesophageal reflux to the level of the UES. Please note the patient does have a nasogastric tube.           SIGNIFICANT MED CHANGES: Yes  Levothyroxine increase from 151mg to 1570m  Reglan increased from 50m30mo 64m250mD,   Stopped Elavil, decadron, lovenox, lexapro, pepcid, Maxzide   Started on Cymbalta, Methadone 64mg47m, diluadid PO PRN, Sodium Chloride tabs 1g, lasix 20mg 89my x 14 days, Lyrica 750mg B63mnystatin, Ferrous sulfate.     CON[facility] [place]     Dooley,Sarina Ill Elizabeth SaueriticaCloverlyini, Brenda [facility] Radarfectious Diseases    Royer, Laurence A[facility]trition    Syrett,Elroy [facility] and Pall[facility] Medicine             Signed: Jendaya Gossett GKathrin Greathousen: 08/25/2017  at: 10:21 AM

## 2017-08-25 NOTE — Progress Notes (Signed)
08/25/17 1250   Post-Discharge Phone Call   Was 24 hour post-discharge phone call completed? Yes   Phone Call Comments Spoke with patient she stated her Fibromyalgia is acting up and having some pain.  Has been eating but has not done her TF.   Per patient her TF site leaked alot over night last night.       Dortha Schwalbe, RN

## 2017-08-29 NOTE — Progress Notes (Signed)
HPB-GI Surgery Follow up note    Chief Complaint  Amanda Galloway is a 72 y.o. female who presents  on 09/02/2017 for HPB-GI follow-up care.     Diagnosis  Locally advanced Pancreatic adenocarinoma    Physician Team  Surgeon:  Delano Metz, MD  PCP:  Celesta Aver, MD  Gastroenterologist: Fonnie Jarvis, MD  Medical Oncology: Calton Dach, MD   Radiation Oncologist: Durene Romans, MD    Interval History  Today, she is feeling okay. She is recovering well after her prolonged hospitalization. She feels her energy has not been the greatest. She has been experiencing vomiting after tube feeds for supplemental nutrition. They will try doing gravity feeds instead of syringes of tube feeds to lessen the vomiting. She has been urinating normally but has not had a bowel movement since her discharge from the hospital. She denies pain, bloating, unintentional weight loss, fevers and chills. We discussed examples of protein-rich foods and increasing her daily activities with periods of rest to increase her energy. We also discussed taking in more calories to help with her energy.     Oncologic History   Over several months, patient noted worsening abdominal pain and weight loss. Extensive work-up including EGD, colonoscopy and imaging were unrevealing. After suggestive HIDA scan, she underwent cholecystectomy on September 06, 2016 but symptoms persisted.      MRCP on September 25, 2016 demonstrated2x2cm soft tissue mass-like abnormality in celiac region, contiguous with medial margin of uncinate process of pancreas concerning for pancreatic lession vs lymphadenopathy.     EUS/FNA on September 27, 2016 demonstrated a mass in pancreas uncinate process and lymph node pressing celiac axis. FNA confirmed adenocarcinoma.     She was transferred to [facility] for additional management. Abd/pelvis CT on September 30, 2016 revealed ill-defined prominent retroperitoneal soft tissue with central low attenuation suggestive of necrosis which abuts SMA and  is adjacent to pancreatic uncinate process similar to prior MRI. Differential includes pancreatic neoplasm versus lymphadenopathy. CT angio on September 29, 2016 showed no pulmonary embolism. Nonspecific 3 mm nodules in right upper and middle lobe, recommend attention on follow-up imaging.      Diagnostic laparoscopy and port placement by Dr. Ron Agee on October 04, 2016 - no evidence of metastatic disease.     FOLFIRINOX chemotherapy - treatment start date 10/05/2016. Completed 8 cycles.     Neoadjuvant SBRT 25 Gy in 5 fractions pancreatic cancer, with concurrent boost to 30 Gy in 5 fractions to tumor around SMA from 02/13/17-03/08/17     She underwent a Whipple procedure with Dr. Delano Metz, MD on 03/29/17.  She was briefly admitted to SICU post operatively for hemodynamic monitoring and the expectation of complicated pain management given patient on home methadone. 1/31 NGT placed for nausea/vomiting. TPN started 2/6. Multiple perc drains placed for fluid collections throughout her stay. On 04/16/17 patient had a PTC drain placed by IR and was transferred back to SICU for increased WOB and was intubated for worsening respiratory status. Chest tube placed 4/5 for left pleural effusion. 4/12 PEG tube placed for poor PO intake. 4/19 gastric perforation from PEG site. 6/7 downsizing of PEG d/t peristomal leakage. 6/13 positive for H. Pylori. Patient's hospital course was also been complicated by constipation, malnutrition, pain control, depression and fluid overload. She was readmitted several times to SICU during her stay.  Palliative care followed patient throughout her hospital stay to assist with pain management, anxiety, depression. All of the patient's drains were removed prior to going home. Her  PICC line was removed as well. She was discharged from the hospital on 08/24/17 to home.       Medical History  Past Medical History:   Diagnosis Date    Acute kidney failure 03/31/2017    Cancer     Depression      Fibromyalgia     Hypothyroidism      Surgical History  Past Surgical History:   Procedure Laterality Date    CHOLECYSTECTOMY      CHOLECYSTECTOMY, LAPAROSCOPIC  09/06/2016    HYSTERECTOMY  08/1986    Fibroids    KNEE SURGERY Right     PR INSERT TUNNELED CV CATH WITH PORT Right 10/04/2016    Procedure: Right IJ MEDIPORT Insertion;  Surgeon: Delano Metz, MD;  Location: [facility] MAIN OR;  Service: Oncology General    PR LAP,DIAGNOSTIC ABDOMEN N/A 10/04/2016    Procedure: LAPAROSCOPY DIAGNOSTIC;  Surgeon: Delano Metz, MD;  Location: [facility] MAIN OR;  Service: Oncology General    PR PART [place] PANC,PROX+REMV DUOD+ANAST N/A 03/29/2017    Procedure: WHIPPLE PROCEDURE;  Surgeon: Delano Metz, MD;  Location: [facility] MAIN OR;  Service: Oncology General    ROTATOR CUFF REPAIR Right     TONSILLECTOMY AND ADENOIDECTOMY       Family History  Family History   Problem Relation Age of Onset    Breast cancer Mother         died 8    Cancer Father         NHL, died 3    Heart Disease Father     Diabetes Sister     Obesity Sister         4 siblings     Social History  Social History     Social History    Marital status: Divorced     Spouse name: N/A    Number of children: N/A    Years of education: N/A     Occupational History    Not on file.     Social History Main Topics    Smoking status: Never Smoker    Smokeless tobacco: Never Used    Alcohol use No    Drug use: No    Sexual activity: Not on file     Social History Narrative    No narrative on file       Medications  Outpatient Encounter Prescriptions as of 09/02/2017   Medication Sig Dispense Refill    amoxicillin-clavulanate (AUGMENTIN) 875-125 MG per tablet Take 1 tablet by mouth 2 times daily for 30 days 60 tablet 0    tube feed formula (TWOCAL HN) 2 cal liquid 1 can TwoCal HN supplemental TF daily via G-tube two times per with 30 ml FWF before and after feed 80 Can 5    nystatin (MYCOSTATIN) 100000 unit/mL suspension Take 5 mLs (500,000 Units total) by mouth 4 times  daily   Swish and hold in mouth before swallowing. 600 mL 0    Syringe, Disposable, (50-60CC SYRINGE) 60 ML MISC For free water flushes. 50 each 5    Nutritional Supplements (PROSOURCE NO CARB) LIQD liquid Take 30 mLs by mouth 2 times daily (with meals)      levothyroxine (SYNTHROID, LEVOTHROID) 150 MCG tablet Take 1 tablet (150 mcg total) by mouth daily (before breakfast) 30 tablet 0    metoclopramide (REGLAN) 10 MG tablet Take 1 tablet (10 mg total) by mouth 4 times daily (before meals and nightly) 120 tablet 0    DULoxetine (  CYMBALTA) 20 MG DR capsule Take 1 capsule (20 mg total) by mouth every evening 30 capsule 0    lactobacillus rhamnosus, GG, (CULTURELLE) capsule Take 1 capsule (1 each total) by mouth daily 30 capsule 0    pregabalin (LYRICA) 75 MG capsule Take 1 capsule (75 mg total) by mouth 2 times daily   Max daily dose: 150 mg 60 capsule 0    sodium chloride 1 GM tablet 1 tablet (1 g total) by Per G Tube route 3 times daily (after meals) 90 tablet 0    pantoprazole (PROTONIX) 40 MG EC tablet Take 1 tablet (40 mg total) by mouth 2 times daily (before meals)   Swallow whole. Do not crush, break, or chew. 60 tablet 0    ferrous sulfate 325 (65 FE) MG tablet Take 1 tablet (325 mg total) by mouth daily (with breakfast) 100 tablet 0    docusate sodium (COLACE) 100 MG capsule Take 2 capsules (200 mg total) by mouth 2 times daily 120 capsule 5    senna (SENOKOT) 8.6 MG tablet Take 2 tablets by mouth 2 times daily 120 tablet 5    PREMARIN 0.625 MG tablet Take 0.625 mg by mouth every morning         rollator Use as directed. 1 each 0    [EXPIRED] tetracycline (ACHROMYCIN,SUMYCIN) 500 MG capsule Take 1 capsule (500 mg total) by mouth 4 times daily for 8 days 32 capsule 0    HYDROmorphone (DILAUDID) 4 MG tablet Take 1 tablet (4 mg total) by mouth every 4 hours as needed for Pain   Max daily dose: 24 mg 20 tablet 0    LORazepam (ATIVAN) 0.5 MG tablet Take 0.5 tablets (0.25 mg total) by mouth nightly  as needed for Anxiety (insomnia)   Max daily dose: 0.25 mg 10 tablet 0    ondansetron (ZOFRAN-ODT) 4 MG disintegrating tablet Take 1 tablet (4 mg total) by mouth 3 times daily as needed (nausea)   Place on top of tongue. 30 tablet 0    [DISCONTINUED] furosemide (LASIX) 20 MG tablet Take 1 tablet (20 mg total) by mouth daily 14 tablet 0    Vitamins - senior (CENTRUM SILVER) TABS Take 1 tablet by mouth daily (Patient taking differently: Take 1 tablet by mouth every morning   ) 30 tablet 0     No facility-administered encounter medications on file as of 09/02/2017.       Allergies  No Known Allergies (drug, envir, food or latex)  Review of Systems  Review of Systems   Constitutional: Positive for malaise/fatigue.   Gastrointestinal: Positive for constipation.   Neurological: Positive for weakness.   Psychiatric/Behavioral: The patient is nervous/anxious.      Physical Exam  BP 123/63    Pulse 97    Temp 36.2 C (97.2 F) (Temporal)    Resp 18    Ht 166.8 cm (5' 5.67")    Wt 66.4 kg (146 lb 7.9 oz)    SpO2 97%    BMI 23.88 kg/m   Physical Exam   Constitutional: She is oriented to person, place, and time. She appears well-developed and well-nourished. No distress.   HENT:   Head: Normocephalic.   Mouth/Throat: No oropharyngeal exudate.   Eyes: Pupils are equal, round, and reactive to light. Conjunctivae and EOM are normal. Right eye exhibits no discharge. Left eye exhibits no discharge. No scleral icterus.   Neck: Normal range of motion.   Cardiovascular: Normal rate, regular rhythm, normal heart  sounds and intact distal pulses.  Exam reveals no gallop and no friction rub.    No murmur heard.  Pulmonary/Chest: Effort normal and breath sounds normal. No respiratory distress. She has no wheezes. She has no rales.   Abdominal: Soft. Bowel sounds are normal. She exhibits no distension and no mass. There is no tenderness. There is no rebound and no guarding.       Musculoskeletal: She exhibits no edema.   Lymphadenopathy:      She has no cervical adenopathy.   Neurological: She is alert and oriented to person, place, and time.   Skin: Skin is warm and dry. No rash noted. She is not diaphoretic. No erythema. No pallor.   Psychiatric: She has a normal mood and affect. Her behavior is normal. Judgment and thought content normal.     Laboratory Data      Lab results: 09/02/17  1416   WBC 9.2       No components found with this basename:  HGB,  HCT,  PLT,  CEA,  CA19-9  Pathology  [id number]     FINAL DIAGNOSIS:   (A) Soft tissue, perineural tissue surrounding SMA, biopsy:   - Benign fibroadipose tissue.   - One lymph node, negative for carcinoma (0/1).     (B) Pancreas, Duodenum, Stomach, Pancreaticoduodenectomy (Whipple   resection):   - A small focus (0.2 cm) of high-grade pancreatic intraepithelial   neoplasia.     - No invasive carcinoma identified.     - Background pancreas with chronic pancreatitis and islet cell   hyperplasia.     - Margins negative for dysplasia or carcinoma.     - Stomach with mild inactive chronic gastritis; negative for H.   pylori on H&E.   - Duodenum with no significant pathologic abnormality.     - Twenty-one lymph nodes, negative for carcinoma (0/21).     - See comment.     (C) Pancreas, uncinate margin, resection:   - Cauterized pancreatic parenchyma and fibroadipose tissue; negative   for carcinoma.   - One lymph node, negative for carcinoma (0/1).     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     Imaging Data  None     Assessment  Omah Dewalt is a 72 y.o. female with a history of malignant neoplasm of the pancreas who underwent a Whipple procedure with Dr. Delano Metz, MD on 03/29/17.  She had a complicated hospital stay which required multiple drains, multiple readmissions to the ICU, TPN, a PEG tube, and multiple scans. She was discharged on 08/24/17. She is  recovering well after her prolonged hospitalization. We discussed the importance of tube feeds and increasing caloric/protein intake. Labs today to assess her nutrition, We will follow up in 2 weeks.     Plan   Gravity bag for tube feeds.   Milk of magnesia for constipation.   Increase daily activities and rest periods.    Increase caloric/protein intake.   Follow up with medical oncology.     Rodrigo Ran, NP      HPB-GI Attending Addendum:   I personally examined the patient, reviewed the notes, and discussed the plan of care with the residents and patient. Agree with detailed resident's note. Please see the note above for details of history, exam, labs, assessment/plan which reflect my input.     72 year old female with pancreatic  cancer who underwent pancreaticoduodenectomy after neoadjuvant chemotherapy. Her post operative course was complicated by intra-abdominal abscess, abdominal pain, gastrostomy tube induced gastric anastomotic dehiscence and prolonged hospitalization. She also has had repeat cholangitis due to biliary source and is currently taking prolonged antibiotics for suppression.     She is doing fairly well. She remains debilitated but is improving. Her abdominal pain which is chronic is under control with methadone and additional medications. Her appetite is improving slowly but her intake remains inadequate. We are encouraging tube feeding support via the gastrostomy tube. We discussed nutritional rich foods.     We will see her in another few weeks.     Delano Metz, MD, FACS  Hepatobiliary, Pancreas & GI Surgery

## 2017-09-02 ENCOUNTER — Ambulatory Visit: Payer: Medicare (Managed Care) | Attending: General Surgery | Admitting: Surgical Oncology

## 2017-09-02 ENCOUNTER — Other Ambulatory Visit
Admission: RE | Admit: 2017-09-02 | Discharge: 2017-09-02 | Disposition: A | Discharge status: Home / Self Care | Payer: Medicare (Managed Care) | Source: Ambulatory Visit | Attending: General Surgery | Admitting: General Surgery

## 2017-09-02 ENCOUNTER — Ambulatory Visit: Payer: Medicare (Managed Care)

## 2017-09-02 VITALS — BP 123/63 | HR 97 | Temp 97.2°F | Resp 18 | Ht 65.67 in | Wt 146.5 lb

## 2017-09-02 DIAGNOSIS — W5503XA Scratched by cat, initial encounter: Secondary | ICD-10-CM | POA: Insufficient documentation

## 2017-09-02 DIAGNOSIS — C259 Malignant neoplasm of pancreas, unspecified: Secondary | ICD-10-CM | POA: Insufficient documentation

## 2017-09-02 DIAGNOSIS — S50819A Abrasion of unspecified forearm, initial encounter: Secondary | ICD-10-CM

## 2017-09-02 DIAGNOSIS — I2699 Other pulmonary embolism without acute cor pulmonale: Secondary | ICD-10-CM | POA: Insufficient documentation

## 2017-09-02 DIAGNOSIS — G893 Neoplasm related pain (acute) (chronic): Secondary | ICD-10-CM

## 2017-09-02 DIAGNOSIS — R109 Unspecified abdominal pain: Secondary | ICD-10-CM

## 2017-09-02 DIAGNOSIS — C250 Malignant neoplasm of head of pancreas: Secondary | ICD-10-CM

## 2017-09-02 DIAGNOSIS — E46 Unspecified protein-calorie malnutrition: Secondary | ICD-10-CM

## 2017-09-02 DIAGNOSIS — E039 Hypothyroidism, unspecified: Secondary | ICD-10-CM

## 2017-09-02 DIAGNOSIS — Y9289 Other specified places as the place of occurrence of the external cause: Secondary | ICD-10-CM | POA: Insufficient documentation

## 2017-09-02 LAB — COMPREHENSIVE METABOLIC PANEL
ALT: 37 U/L — ABNORMAL HIGH (ref 0–35)
AST: 54 U/L — ABNORMAL HIGH (ref 0–35)
Albumin: 4 g/dL (ref 3.5–5.2)
Alk Phos: 735 U/L — ABNORMAL HIGH (ref 35–105)
Anion Gap: 18 — ABNORMAL HIGH (ref 7–16)
Bilirubin,Total: 0.4 mg/dL (ref 0.0–1.2)
CO2: 25 mmol/L (ref 20–28)
Calcium: 9.6 mg/dL (ref 8.6–10.2)
Chloride: 98 mmol/L (ref 96–108)
Creatinine: 0.87 mg/dL (ref 0.51–0.95)
GFR,Black: 77 *
GFR,Caucasian: 67 *
Glucose: 105 mg/dL — ABNORMAL HIGH (ref 60–99)
Lab: 14 mg/dL (ref 6–20)
Potassium: 4.3 mmol/L (ref 3.3–5.1)
Sodium: 141 mmol/L (ref 133–145)
Total Protein: 8.5 g/dL — ABNORMAL HIGH (ref 6.3–7.7)

## 2017-09-02 LAB — TYPE AND SCREEN
ABO RH Blood Type: A NEG
Antibody Screen: NEGATIVE

## 2017-09-02 LAB — CBC AND DIFFERENTIAL
Baso # K/uL: 0.1 10*3/uL (ref 0.0–0.1)
Basophil %: 1 %
Eos # K/uL: 0.3 10*3/uL (ref 0.0–0.4)
Eosinophil %: 3.6 %
Hematocrit: 31 % — ABNORMAL LOW (ref 34–45)
Hemoglobin: 9.7 g/dL — ABNORMAL LOW (ref 11.2–15.7)
IMM Granulocytes #: 0 10*3/uL
IMM Granulocytes: 0.4 %
Lymph # K/uL: 1.5 10*3/uL (ref 1.2–3.7)
Lymphocyte %: 15.8 %
MCH: 30 pg/cell (ref 26–32)
MCHC: 32 g/dL (ref 32–36)
MCV: 93 fL (ref 79–95)
Mono # K/uL: 0.7 10*3/uL (ref 0.2–0.9)
Monocyte %: 8.1 %
Neut # K/uL: 6.5 10*3/uL — ABNORMAL HIGH (ref 1.6–6.1)
Nucl RBC # K/uL: 0 10*3/uL (ref 0.0–0.0)
Nucl RBC %: 0 /100 WBC (ref 0.0–0.2)
Platelets: 397 10*3/uL — ABNORMAL HIGH (ref 160–370)
RBC: 3.3 MIL/uL — ABNORMAL LOW (ref 3.9–5.2)
RDW: 17.2 % — ABNORMAL HIGH (ref 11.7–14.4)
Seg Neut %: 71.1 %
WBC: 9.2 10*3/uL (ref 4.0–10.0)

## 2017-09-02 LAB — PREALBUMIN: Prealbumin: 13 mg/dL — ABNORMAL LOW (ref 20–40)

## 2017-09-02 LAB — MULTIPLE ORDERING DOCS

## 2017-09-02 LAB — PROTIME-INR
INR: 1.3 — ABNORMAL HIGH (ref 0.9–1.1)
Protime: 14.5 s — ABNORMAL HIGH (ref 10.0–12.9)

## 2017-09-02 LAB — CA 19 9 (EFF. 01-2011): CA 19 9 (eff. 01-2011): 58 U/mL — ABNORMAL HIGH (ref 0–35)

## 2017-09-02 LAB — T4, FREE: Free T4: 0.8 ng/dL — ABNORMAL LOW (ref 0.9–1.7)

## 2017-09-02 LAB — NEUTROPHIL #-INSTRUMENT: Neutrophil #-Instrument: 6.5 10*3/uL

## 2017-09-02 NOTE — Progress Notes (Signed)
[place]  Outpatient Nutrition Service - Initial Assessment     Reason for Referral:  Post-admission follow-up     Brief Oncology History:  Amanda Galloway is a 72 y.o. female with pancreatic cancer s/p Whipple on 3/54/65 complicated by rising LFTs and PV compression due a hematoma and afferent limb distention in the setting of PE treatment. Required an ICU stay with intubation for acute respiratory failure. Also, developed severe constipation and intraabdominal abscesses s/p IR drainage, and found to have delayed gastric emptying. PEG placed by IR on 06/14/17, then was exchanged on 08/09/17 after it was leaking. Discharged home on 6/23.     Enteral Nutrition:  Route: PEG  Formula: TwoCal HN  Obtained from: [facility]     History:   has a past medical history of Acute kidney failure (03/31/2017); Cancer; Depression; Fibromyalgia; and Hypothyroidism.   has a past surgical history that includes Cholecystectomy, laparoscopic (09/06/2016); Hysterectomy (08/1986); Rotator cuff repair (Right); knee surgery (Right); Tonsillectomy and adenoidectomy; pr lap,diagnostic abdomen (N/A, 10/04/2016); pr insert tunneled cv cath with port (Right, 10/04/2016); Cholecystectomy; and pr part remv panc,prox+remv duod+anast (N/A, 03/29/2017).  Social History     Social History    Marital status: Divorced     Spouse name: N/A    Number of children: N/A    Years of education: N/A     Occupational History    Not on file.     Social History Main Topics    Smoking status: Never Smoker    Smokeless tobacco: Never Used    Alcohol use No    Drug use: No    Sexual activity: Not on file     Social History Narrative    No narrative on file     Medications:  has a current medication list which includes the following prescription(s): amoxicillin-clavulanate, tube feed formula, rollator, nystatin, 50-60cc syringe, hydromorphone, prosource no carb, levothyroxine, metoclopramide, duloxetine, furosemide, lactobacillus rhamnosus (gg), pregabalin,  sodium chloride, lorazepam, ondansetron, pantoprazole, ferrous sulfate, docusate sodium, senna, premarin, and vitamins - senior.    Labs: 08/21/17, reviewed     Diet recall:  Eating ~1-2x per day:  Cottage cheese and mandarin oranges  1/2 hot dog and 1/4 cup mac salad  4oz cheese Burger on bun   [employer]: 1-2 cups per day   Yesterday 6/30 ate toast and fruit    Food Allergy/Intolerance: NKFA    Physical Activity: normal activities of daily living    Nutrition Related Side-Effects:   Anorexia: Yes   Dysphagia: No   Odynophagia: No   Pain: No   Taste/smell changes: No   Xerostomia: No   Mucositis: No   Nausea: No   Vomiting: No   Early satiety: Yes   BM's:  Constipation, no BM for ~1 week     Anthropometrics:  Wt Readings from Last 10 Encounters:   09/02/17 66.4 kg (146 lb 7.9 oz)   08/20/17 70 kg (154 lb 6.4 oz)   05/14/17 64.9 kg (143 lb)   03/22/17 71.2 kg (157 lb)   03/11/17 73 kg (160 lb 15 oz)   03/06/17 72.3 kg (159 lb 8 oz)   03/01/17 73.8 kg (162 lb 9.6 oz)   02/19/17 72.6 kg (160 lb)   02/12/17 71.6 kg (157 lb 14.2 oz)   02/01/17 73 kg (161 lb)     Estimated body mass index is 24.18 kg/m as calculated from the following:    Height as of 05/14/17: 170.2 cm (5'  7.01").    Weight as of 08/20/17: 70 kg (154 lb 6.4 oz).    Ideal Body Weight: 72.4 kg +/- 10%; 92% IBW  Usual Body Weight: 72.7kg; 91% UBW    Estimated Nutritional Needs: (based on 66.4 kg)  2000-2650 kcal (30-40 kcal/kg) promote weight gain   80-100 g pro (1.2-1.5 g/kg)  2-3 L fluid     Nutrition Assessment:   Amanda Galloway is  72 y.o. female with pancreatic cancer s/p Whipple and complicated, prolonged hospital stay. She is here today with her daughter, [name]. We discussed PO intake and tube feeding tolerance. She is eating ~1-2 times per day, admits PO intake is limited because she has no appetite. Difficulty tolerating tube feeding, has emesis often, ~1 hour after feeding. Also endorsed emesis with eating too much at once. We discussed  eating small meals/snacks throughout the day. This can help with tolerance and increase appetite. For tube feeding, I recommend infusing via gravity instead of syringe for a slower infusion. Also discussed staying upright for 2 hours after feeding. May need lower fat formula, but will try gravity feeding first. Daughter does not want Kieana to rely on tube feeding. We discussed the need to continue tube feeding until PO intake increases. Currently, [name] is administering tube feeding via syringe. [name] is going back to work and Sharonna is unable to administer tube feeding via syringe on her own, another reason why gravity feeding would be ideal.      Significant weight loss:   8 lbs (5%) x 2 weeks     Nutrition Intervention/Patient Education:    Consume a small meal/snack every 2-3 hours   Cottage cheese and mandarin oranges   Cheese and crackers   Greek yogurt    Increase PO fluid intake to 3 -12oz cups/bottles per day   Provided education on increasing calories without increasing portion size   Discuss constipation with GI oncology, appointment this afternoon     Nutrition Monitoring/Evaluation:   1. Will monitor diet tolerance and intake, nutrition related labs, weight trend, nutrition impact symptoms.  2. Follow up:  As needed    [facility], [place], [place]  Office: [phone]  Pager: [phone]  Appts: [phone]  PIC: [phone]

## 2017-09-04 ENCOUNTER — Telehealth: Payer: Self-pay | Admitting: Surgery

## 2017-09-04 DIAGNOSIS — E46 Unspecified protein-calorie malnutrition: Secondary | ICD-10-CM

## 2017-09-04 NOTE — Telephone Encounter (Signed)
Spoke with [facility]  Patient has no feeding supplies including syringes or dressings  Leaking around PEG site  No feeds since discharge.  Tolerating small amounts of food by mouth  Seen on 09/02/2017 by RD.  Recommended continued TF via gravity  Will have to contact [facility] to provided supplies on Friday

## 2017-09-04 NOTE — Telephone Encounter (Signed)
Patient received tube feed formula without syringes.  Also will wound dressing supplies.

## 2017-09-06 MED ORDER — ENTERAL DELIVERY GRAVITY BAG MISC
1.5000 | Freq: Every day | 3 refills | Status: DC
Start: 2017-09-06 — End: 2017-09-25

## 2017-09-06 MED ORDER — SYRINGE FOR IVA *I*
3 refills | Status: DC
Start: 2017-09-06 — End: 2017-09-25

## 2017-09-06 NOTE — Telephone Encounter (Signed)
I called patient's daughter to  Left message on voicemail  Evaluate how she is doing on oral feeds.  Progress with bolus feeds.

## 2017-09-06 NOTE — Telephone Encounter (Signed)
[facility] nutrition referrals  Call: [phone]  Fax: [phone]

## 2017-09-07 LAB — BARTONELLA IGG & IGM ANTIBODIES
B.Quintana IgM: <1:16 {titer}
B.henselae IgG: 1:512 {titer} — AB
B.henselae IgM: <1:16 {titer}
B.quintana IgG: <1:64 {titer}

## 2017-09-09 ENCOUNTER — Other Ambulatory Visit: Payer: Self-pay | Admitting: Surgery

## 2017-09-09 DIAGNOSIS — A281 Cat-scratch disease: Secondary | ICD-10-CM

## 2017-09-09 MED ORDER — AZITHROMYCIN 250 MG PO TABS *I*
ORAL_TABLET | ORAL | 0 refills | Status: AC
Start: 2017-09-09 — End: 2017-09-14

## 2017-09-11 ENCOUNTER — Telehealth: Payer: Self-pay | Admitting: Surgery

## 2017-09-11 DIAGNOSIS — C259 Malignant neoplasm of pancreas, unspecified: Secondary | ICD-10-CM

## 2017-09-11 DIAGNOSIS — R112 Nausea with vomiting, unspecified: Secondary | ICD-10-CM

## 2017-09-11 DIAGNOSIS — E46 Unspecified protein-calorie malnutrition: Secondary | ICD-10-CM

## 2017-09-11 NOTE — Telephone Encounter (Signed)
Amanda Galloway is complaining of nausea after tube feeds and eating food. Also needs home bldwork draw orders for [facility] lab to come to her house

## 2017-09-11 NOTE — Telephone Encounter (Signed)
Spoke with nurse  Aware of chronic nausea issues, poor appetite and low energy  She was able to eat small amount of mac n cheese today  UGI w/ SBFT pending  Will make arrangements for IVF at home.  Twice weekly for now  Will make arrangement for home blood draws Q 2 weeks.

## 2017-09-12 NOTE — Progress Notes (Signed)
[facility]-GI Surgery Follow up note    Chief Complaint  Amanda Galloway is a 72 y.o. female who presents  on 09/18/2017 for [facility]-GI follow-up care.     Diagnosis  Locally advanced Pancreatic adenocarinoma    Physician Team  Surgeon: Delano Metz, MD  PCP: Celesta Aver, MD  Gastroenterologist: Fonnie Jarvis, MD  Medical Oncology: Calton Dach, MD   Radiation Oncologist: Durene Romans, MD    Interval History  She presents today for second postoperative visit. She has called the office with several concerns. Nausea & intermittent vomiting have been her major issues. We recommended an upper GI which showed a stricture.     Her & daughter reports she has been having several episodes of emesis per day. Approximately 1L per episode, and all undigested food. Unable to give self enteral feedings. Due to nausea, vomiting and leaking from tube insertion site. No fevers, chills, abdominal pain, constipation or diarrhea. Daughter reports has been taking minimal PO intake (300 to 400 calories per day) and has lost 10 pounds since last seen. Currently she is taking 2.5 mg methadone BID which keeps her pain at bay. Daughter also reports patient has not been able to ambulate frequently give the fact she is weak. Her energy level is poor and spends most of her days sitting in her couch.     Oncologic History   Over several months, patient noted worsening abdominal pain and weight loss. Extensive work-up including EGD, colonoscopy and imaging were unrevealing. After suggestive HIDA scan, she underwent cholecystectomy on September 06, 2016 but symptoms persisted.      MRCP on September 25, 2016 demonstrated2x2cm soft tissue mass-like abnormality in celiac region, contiguous with medial margin of uncinate process of pancreas concerning for pancreatic lession vs lymphadenopathy.     EUS/FNA on September 27, 2016 demonstrated a mass in pancreas uncinate process and lymph node pressing celiac axis. FNA confirmed adenocarcinoma.     She was  transferred to [facility] for additional management. Abd/pelvis CT on September 30, 2016 revealed ill-defined prominent retroperitoneal soft tissue with central low attenuation suggestive of necrosis which abuts SMA and is adjacent to pancreatic uncinate process similar to prior MRI. Differential includes pancreatic neoplasm versus lymphadenopathy. CT angio on September 29, 2016 showed no pulmonary embolism. Nonspecific 3 mm nodules in right upper and middle lobe, recommend attention on follow-up imaging.      Diagnostic laparoscopy and port placement by Dr. Ron Agee on October 04, 2016 - no evidence of metastatic disease.     FOLFIRINOX chemotherapy - treatment start date 10/05/2016. Completed 8 cycles.     Neoadjuvant SBRT 25 Gy in 5 fractions pancreatic cancer, with concurrent boost to 30 Gy in 5 fractions to tumor around SMA from 02/13/17-03/08/17     She underwent a Whipple procedure with Dr. Delano Metz, MD on 03/29/17. She was briefly admitted to SICU post operatively for hemodynamic monitoring and the expectation of complicated pain management given patient on home methadone. 1/31 NGT placed for nausea/vomiting. TPN started 2/6. Multiple perc drains placed for fluid collections throughout her stay. On 04/16/17 patient had a PTC drain placed by IR and was transferred back to SICU for increased WOB and was intubated for worsening respiratory status. Chest tube placed 4/5 for left pleural effusion. 4/12 PEG tube placed for poor PO intake. 4/19 gastric perforation from PEG site. 6/7 downsizing of PEG d/t peristomal leakage. 6/13 positive for H. Pylori. Patient's hospital course was also been complicated by constipation, malnutrition, pain control, depression and  fluid overload. She was readmitted several times to SICU during her stay.  Palliative care followed patient throughout her hospital stay to assist with pain management, anxiety, depression. All of the patient's drains were removed prior to going home. Her PICC line was  removed as well. She was discharged from the hospital on 08/24/17 to home.       Medical History  Past Medical History:   Diagnosis Date    Acute kidney failure 03/31/2017    Cancer     Depression     Fibromyalgia     Hypothyroidism      Surgical History  Past Surgical History:   Procedure Laterality Date    CHOLECYSTECTOMY      CHOLECYSTECTOMY, LAPAROSCOPIC  09/06/2016    HYSTERECTOMY  08/1986    Fibroids    KNEE SURGERY Right     PR INSERT TUNNELED CV CATH WITH PORT Right 10/04/2016    Procedure: Right IJ MEDIPORT Insertion;  Surgeon: Delano Metz, MD;  Location: [facility] MAIN OR;  Service: Oncology General    PR LAP,DIAGNOSTIC ABDOMEN N/A 10/04/2016    Procedure: LAPAROSCOPY DIAGNOSTIC;  Surgeon: Delano Metz, MD;  Location: [facility] MAIN OR;  Service: Oncology General    PR PART [place] PANC,PROX+REMV DUOD+ANAST N/A 03/29/2017    Procedure: WHIPPLE PROCEDURE;  Surgeon: Delano Metz, MD;  Location: [facility] MAIN OR;  Service: Oncology General    ROTATOR CUFF REPAIR Right     TONSILLECTOMY AND ADENOIDECTOMY       Family History  Family History   Problem Relation Age of Onset    Breast cancer Mother         died 58    Cancer Father         NHL, died 60    Heart Disease Father     Diabetes Sister     Obesity Sister         4 siblings     Social History  Social History     Social History    Marital status: Divorced     Spouse name: N/A    Number of children: N/A    Years of education: N/A     Occupational History    Not on file.     Social History Main Topics    Smoking status: Never Smoker    Smokeless tobacco: Never Used    Alcohol use No    Drug use: No    Sexual activity: Not on file     Social History Narrative    No narrative on file       Medications  Outpatient Encounter Prescriptions as of 09/18/2017   Medication Sig Dispense Refill    amoxicillin-clavulanate (AUGMENTIN) 875-125 MG per tablet Take 1 tablet by mouth 2 times daily for 30 days 60 tablet 0    [EXPIRED] azithromycin (ZITHROMAX) 250 MG tablet Take  2 tablets (500 mg) on day 1, followed by 1 tablet (250 mg) on days 2 through 5. 6 tablet 0    [EXPIRED] azithromycin (ZITHROMAX) 250 MG tablet Take 2 tablets (500 mg) on day 1, followed by 1 tablet (250 mg) on days 2 through 5. 6 tablet 0    Enteral Nutrition Supplies (ENTERAL DELIVERY GRAVITY BAG) MISC 1.5 Cans by PEG Tube route daily (with dinner) 30 each 3    Syringe (B-D SYRINGE CATH TIP 60CC) 60 ML MISC Free water flushes to PEG 40 each 3    tube feed formula (TWOCAL HN) 2 cal liquid 1  can TwoCal HN supplemental TF daily via G-tube two times per with 30 ml FWF before and after feed 80 Can 5    rollator Use as directed. 1 each 0    nystatin (MYCOSTATIN) 100000 unit/mL suspension Take 5 mLs (500,000 Units total) by mouth 4 times daily   Swish and hold in mouth before swallowing. 600 mL 0    Syringe, Disposable, (50-60CC SYRINGE) 60 ML MISC For free water flushes. 50 each 5    HYDROmorphone (DILAUDID) 4 MG tablet Take 1 tablet (4 mg total) by mouth every 4 hours as needed for Pain   Max daily dose: 24 mg 20 tablet 0    Nutritional Supplements (PROSOURCE NO CARB) LIQD liquid Take 30 mLs by mouth 2 times daily (with meals)      levothyroxine (SYNTHROID, LEVOTHROID) 150 MCG tablet Take 1 tablet (150 mcg total) by mouth daily (before breakfast) 30 tablet 0    metoclopramide (REGLAN) 10 MG tablet Take 1 tablet (10 mg total) by mouth 4 times daily (before meals and nightly) 120 tablet 0    DULoxetine (CYMBALTA) 20 MG DR capsule Take 1 capsule (20 mg total) by mouth every evening 30 capsule 0    lactobacillus rhamnosus, GG, (CULTURELLE) capsule Take 1 capsule (1 each total) by mouth daily 30 capsule 0    pregabalin (LYRICA) 75 MG capsule Take 1 capsule (75 mg total) by mouth 2 times daily   Max daily dose: 150 mg 60 capsule 0    sodium chloride 1 GM tablet 1 tablet (1 g total) by Per G Tube route 3 times daily (after meals) 90 tablet 0    LORazepam (ATIVAN) 0.5 MG tablet Take 0.5 tablets (0.25 mg total)  by mouth nightly as needed for Anxiety (insomnia)   Max daily dose: 0.25 mg 10 tablet 0    ondansetron (ZOFRAN-ODT) 4 MG disintegrating tablet Take 1 tablet (4 mg total) by mouth 3 times daily as needed (nausea)   Place on top of tongue. 30 tablet 0    pantoprazole (PROTONIX) 40 MG EC tablet Take 1 tablet (40 mg total) by mouth 2 times daily (before meals)   Swallow whole. Do not crush, break, or chew. 60 tablet 0    ferrous sulfate 325 (65 FE) MG tablet Take 1 tablet (325 mg total) by mouth daily (with breakfast) 100 tablet 0    docusate sodium (COLACE) 100 MG capsule Take 2 capsules (200 mg total) by mouth 2 times daily 120 capsule 5    senna (SENOKOT) 8.6 MG tablet Take 2 tablets by mouth 2 times daily 120 tablet 5    PREMARIN 0.625 MG tablet Take 0.625 mg by mouth every morning         Vitamins - senior (CENTRUM SILVER) TABS Take 1 tablet by mouth daily (Patient taking differently: Take 1 tablet by mouth every morning   ) 30 tablet 0     Facility-Administered Encounter Medications as of 09/18/2017   Medication Dose Route Frequency Provider Last Rate Last Dose    sodium chloride 0.9 % IV  100 mL/hr Intravenous Continuous Rodrigo Ran, NP        ondansetron (ZOFRAN) injection 4 mg  4 mg Intravenous Q8H PRN Rodrigo Ran, NP          Allergies  No Known Allergies (drug, envir, food or latex)  Review of Systems  Review of Systems   Constitutional: Positive for malaise/fatigue and weight loss. Negative for chills and fever.   HENT:  Negative for hearing loss.    Eyes: Negative for blurred vision.   Respiratory: Negative for cough and shortness of breath.    Cardiovascular: Negative for chest pain, palpitations and leg swelling.   Gastrointestinal: Positive for abdominal pain, nausea and vomiting. Negative for blood in stool, constipation, diarrhea and heartburn.   Genitourinary: Negative for frequency.   Skin: Negative for rash.   Neurological: Positive for weakness.   Endo/Heme/Allergies: Does not  bruise/bleed easily.   Psychiatric/Behavioral: Negative for memory loss. The patient is nervous/anxious.      Physical Exam  BP 106/55 (BP Location: Left arm, Patient Position: Sitting, Cuff Size: adult)    Pulse 94    Temp 36.2 C (97.2 F) (Temporal)    Resp 16    Ht 165.1 cm (_0 )    Wt 62.1 kg (137 lb)    SpO2 97%    BMI 22.80 kg/m   Physical Exam   Constitutional: She is oriented to person, place, and time. She appears well-developed and well-nourished. No distress.   HENT:   Head: Normocephalic.   Mouth/Throat: No oropharyngeal exudate.   Eyes: Pupils are equal, round, and reactive to light. Conjunctivae and EOM are normal. Right eye exhibits no discharge. Left eye exhibits no discharge. No scleral icterus.   Neck: Normal range of motion.   Cardiovascular: Normal rate, regular rhythm, normal heart sounds and intact distal pulses.  Exam reveals no gallop and no friction rub.    No murmur heard.  Pulmonary/Chest: Effort normal and breath sounds normal. No respiratory distress. She has no wheezes. She has no rales.   Abdominal: Soft. Bowel sounds are normal. She exhibits no distension and no mass. There is no tenderness. There is no rebound and no guarding.       Musculoskeletal: She exhibits no edema.   Lymphadenopathy:     She has no cervical adenopathy.   Neurological: She is alert and oriented to person, place, and time.   Skin: Skin is warm and dry. No rash noted. She is not diaphoretic. No erythema. No pallor.   Psychiatric: She has a normal mood and affect. Her behavior is normal. Judgment and thought content normal.     Laboratory Data      Lab results: 09/18/17  0956   WBC 8.3       No components found with this basename:  HGB,  HCT,  PLT,  CEA,  CA19-9  Pathology  [id number]     FINAL DIAGNOSIS:   (A) Soft tissue, perineural tissue surrounding SMA, biopsy:   - Benign fibroadipose tissue.   - One lymph node, negative for carcinoma (0/1).     (B) Pancreas, Duodenum, Stomach, Pancreaticoduodenectomy  (Whipple   resection):   - A small focus (0.2 cm) of high-grade pancreatic intraepithelial   neoplasia.     - No invasive carcinoma identified.     - Background pancreas with chronic pancreatitis and islet cell   hyperplasia.     - Margins negative for dysplasia or carcinoma.     - Stomach with mild inactive chronic gastritis; negative for H.   pylori on H&E.   - Duodenum with no significant pathologic abnormality.     - Twenty-one lymph nodes, negative for carcinoma (0/21).     - See comment.     (C) Pancreas, uncinate margin, resection:   - Cauterized pancreatic parenchyma and fibroadipose tissue; negative   for carcinoma.   - One lymph node, negative for carcinoma (0/1).     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   Imaging Data  No results found.     Assessment  Amanda Galloway is a 72 y.o.  old female with pancreatic cancer who underwent pancreaticoduodenectomy after neoadjuvant chemotherapy. Her post operative course was complicated by intra-abdominal abscess, abdominal pain, gastrostomy tube induced gastric anastomotic dehiscence and prolonged hospitalization.     Today she presents with daily vomiting and failure to thrive . Her upper GI showed an anastomic stricture. She will be admitted for nutritional support and for endoscopy evaluation. She is agreeable to this plan.     Plan   Wcc5 admission to inpatient team.    Will need Gi for endoscopy.    Plan dicussed with Dr. Dennard Schaumann who agrees with plan.     Rodrigo Ran, NP

## 2017-09-13 ENCOUNTER — Other Ambulatory Visit: Payer: Self-pay | Admitting: Surgery

## 2017-09-13 ENCOUNTER — Ambulatory Visit
Admission: RE | Admit: 2017-09-13 | Discharge: 2017-09-13 | Disposition: A | Discharge status: Home / Self Care | Payer: Medicare (Managed Care) | Source: Ambulatory Visit | Attending: Surgery | Admitting: Surgery

## 2017-09-13 ENCOUNTER — Encounter: Payer: Self-pay | Admitting: Gastroenterology

## 2017-09-13 DIAGNOSIS — R112 Nausea with vomiting, unspecified: Secondary | ICD-10-CM

## 2017-09-13 DIAGNOSIS — R1114 Bilious vomiting: Secondary | ICD-10-CM | POA: Insufficient documentation

## 2017-09-18 ENCOUNTER — Ambulatory Visit: Payer: Medicare (Managed Care) | Attending: General Surgery | Admitting: General Surgery

## 2017-09-18 ENCOUNTER — Other Ambulatory Visit
Admission: RE | Admit: 2017-09-18 | Discharge: 2017-09-18 | Disposition: A | Discharge status: Home / Self Care | Payer: Medicare (Managed Care) | Source: Ambulatory Visit

## 2017-09-18 ENCOUNTER — Inpatient Hospital Stay
Admission: AD | Admit: 2017-09-18 | Discharge: 2017-09-25 | DRG: 380 | Disposition: A | Discharge status: Home Health | Payer: Medicare (Managed Care) | Source: Ambulatory Visit | Attending: Surgical Oncology | Admitting: Surgical Oncology

## 2017-09-18 ENCOUNTER — Other Ambulatory Visit: Payer: Self-pay | Admitting: Cardiology

## 2017-09-18 ENCOUNTER — Ambulatory Visit: Payer: Medicare (Managed Care)

## 2017-09-18 VITALS — BP 106/55 | HR 94 | Temp 97.2°F | Resp 16 | Ht 65.0 in | Wt 137.0 lb

## 2017-09-18 DIAGNOSIS — C259 Malignant neoplasm of pancreas, unspecified: Secondary | ICD-10-CM

## 2017-09-18 DIAGNOSIS — M797 Fibromyalgia: Secondary | ICD-10-CM | POA: Diagnosis present

## 2017-09-18 DIAGNOSIS — Z931 Gastrostomy status: Secondary | ICD-10-CM

## 2017-09-18 DIAGNOSIS — K311 Adult hypertrophic pyloric stenosis: Principal | ICD-10-CM | POA: Diagnosis present

## 2017-09-18 DIAGNOSIS — R627 Adult failure to thrive: Secondary | ICD-10-CM

## 2017-09-18 DIAGNOSIS — E039 Hypothyroidism, unspecified: Secondary | ICD-10-CM | POA: Diagnosis present

## 2017-09-18 DIAGNOSIS — E43 Unspecified severe protein-calorie malnutrition: Secondary | ICD-10-CM | POA: Diagnosis present

## 2017-09-18 DIAGNOSIS — R112 Nausea with vomiting, unspecified: Secondary | ICD-10-CM

## 2017-09-18 DIAGNOSIS — N179 Acute kidney failure, unspecified: Secondary | ICD-10-CM | POA: Diagnosis present

## 2017-09-18 DIAGNOSIS — I499 Cardiac arrhythmia, unspecified: Secondary | ICD-10-CM

## 2017-09-18 DIAGNOSIS — F329 Major depressive disorder, single episode, unspecified: Secondary | ICD-10-CM

## 2017-09-18 DIAGNOSIS — F3289 Other specified depressive episodes: Secondary | ICD-10-CM | POA: Diagnosis present

## 2017-09-18 DIAGNOSIS — Z6821 Body mass index (BMI) 21.0-21.9, adult: Secondary | ICD-10-CM

## 2017-09-18 LAB — COMPREHENSIVE METABOLIC PANEL
ALT: 43 U/L — ABNORMAL HIGH (ref 0–35)
ALT: 42 U/L — ABNORMAL HIGH (ref 0–35)
AST: 66 U/L — ABNORMAL HIGH (ref 0–35)
AST: 59 U/L — ABNORMAL HIGH (ref 0–35)
Albumin: 4 g/dL (ref 3.5–5.2)
Albumin: 4 g/dL (ref 3.5–5.2)
Alk Phos: 684 U/L — ABNORMAL HIGH (ref 35–105)
Alk Phos: 663 U/L — ABNORMAL HIGH (ref 35–105)
Anion Gap: 18 — ABNORMAL HIGH (ref 7–16)
Anion Gap: 16 (ref 7–16)
Bilirubin,Total: 0.4 mg/dL (ref 0.0–1.2)
Bilirubin,Total: 0.4 mg/dL (ref 0.0–1.2)
CO2: 23 mmol/L (ref 20–28)
CO2: 24 mmol/L (ref 20–28)
Calcium: 9.6 mg/dL (ref 8.6–10.2)
Calcium: 9.4 mg/dL (ref 8.6–10.2)
Chloride: 94 mmol/L — ABNORMAL LOW (ref 96–108)
Chloride: 96 mmol/L (ref 96–108)
Creatinine: 0.73 mg/dL (ref 0.51–0.95)
Creatinine: 0.66 mg/dL (ref 0.51–0.95)
GFR,Black: 96 *
GFR,Black: 103 *
GFR,Caucasian: 83 *
GFR,Caucasian: 89 *
Glucose: 111 mg/dL — ABNORMAL HIGH (ref 60–99)
Glucose: 103 mg/dL — ABNORMAL HIGH (ref 60–99)
Lab: 12 mg/dL (ref 6–20)
Lab: 13 mg/dL (ref 6–20)
Potassium: 4.6 mmol/L (ref 3.3–5.1)
Potassium: 4.1 mmol/L (ref 3.3–5.1)
Sodium: 135 mmol/L (ref 133–145)
Sodium: 136 mmol/L (ref 133–145)
Total Protein: 8.3 g/dL — ABNORMAL HIGH (ref 6.3–7.7)
Total Protein: 8.4 g/dL — ABNORMAL HIGH (ref 6.3–7.7)

## 2017-09-18 LAB — CBC AND DIFFERENTIAL
Baso # K/uL: 0.1 10*3/uL (ref 0.0–0.1)
Baso # K/uL: 0.1 10*3/uL (ref 0.0–0.1)
Basophil %: 1.1 %
Basophil %: 1 %
Eos # K/uL: 0.3 10*3/uL (ref 0.0–0.4)
Eos # K/uL: 0.4 10*3/uL (ref 0.0–0.4)
Eosinophil %: 3.8 %
Eosinophil %: 4.9 %
Hematocrit: 29 % — ABNORMAL LOW (ref 34–45)
Hematocrit: 28 % — ABNORMAL LOW (ref 34–45)
Hemoglobin: 9.1 g/dL — ABNORMAL LOW (ref 11.2–15.7)
Hemoglobin: 8.8 g/dL — ABNORMAL LOW (ref 11.2–15.7)
IMM Granulocytes #: 0 10*3/uL
IMM Granulocytes #: 0 10*3/uL
IMM Granulocytes: 0.5 %
IMM Granulocytes: 0.3 %
Lymph # K/uL: 1.3 10*3/uL (ref 1.2–3.7)
Lymph # K/uL: 1.6 10*3/uL (ref 1.2–3.7)
Lymphocyte %: 15.6 %
Lymphocyte %: 21.1 %
MCH: 28 pg/cell (ref 26–32)
MCH: 29 pg/cell (ref 26–32)
MCHC: 31 g/dL — ABNORMAL LOW (ref 32–36)
MCHC: 31 g/dL — ABNORMAL LOW (ref 32–36)
MCV: 92 fL (ref 79–95)
MCV: 92 fL (ref 79–95)
Mono # K/uL: 0.7 10*3/uL (ref 0.2–0.9)
Mono # K/uL: 0.6 10*3/uL (ref 0.2–0.9)
Monocyte %: 8.2 %
Monocyte %: 7.3 %
Neut # K/uL: 5.9 10*3/uL (ref 1.6–6.1)
Neut # K/uL: 5.1 10*3/uL (ref 1.6–6.1)
Nucl RBC # K/uL: 0 10*3/uL (ref 0.0–0.0)
Nucl RBC # K/uL: 0 10*3/uL (ref 0.0–0.0)
Nucl RBC %: 0 /100 WBC (ref 0.0–0.2)
Nucl RBC %: 0 /100 WBC (ref 0.0–0.2)
Platelets: 558 10*3/uL — ABNORMAL HIGH (ref 160–370)
Platelets: 516 10*3/uL — ABNORMAL HIGH (ref 160–370)
RBC: 3.2 MIL/uL — ABNORMAL LOW (ref 3.9–5.2)
RBC: 3.1 MIL/uL — ABNORMAL LOW (ref 3.9–5.2)
RDW: 16.8 % — ABNORMAL HIGH (ref 11.7–14.4)
RDW: 16.9 % — ABNORMAL HIGH (ref 11.7–14.4)
Seg Neut %: 70.8 %
Seg Neut %: 65.4 %
WBC: 8.3 10*3/uL (ref 4.0–10.0)
WBC: 7.8 10*3/uL (ref 4.0–10.0)

## 2017-09-18 LAB — CBC
Hematocrit: 23 % — ABNORMAL LOW (ref 34–45)
Hemoglobin: 7.2 g/dL — ABNORMAL LOW (ref 11.2–15.7)
MCH: 29 pg/cell (ref 26–32)
MCHC: 31 g/dL — ABNORMAL LOW (ref 32–36)
MCV: 92 fL (ref 79–95)
Platelets: 425 10*3/uL — ABNORMAL HIGH (ref 160–370)
RBC: 2.5 MIL/uL — ABNORMAL LOW (ref 3.9–5.2)
RDW: 16.8 % — ABNORMAL HIGH (ref 11.7–14.4)
WBC: 7 10*3/uL (ref 4.0–10.0)

## 2017-09-18 LAB — CA 19 9 (EFF. 01-2011): CA 19 9 (eff. 01-2011): 99 U/mL — ABNORMAL HIGH (ref 0–35)

## 2017-09-18 LAB — MULTIPLE ORDERING DOCS

## 2017-09-18 LAB — AMYLASE: Amylase: 95 U/L (ref 28–100)

## 2017-09-18 LAB — NEUTROPHIL #-INSTRUMENT: Neutrophil #-Instrument: 5.9 10*3/uL

## 2017-09-18 LAB — LIPASE: Lipase: 8 U/L — ABNORMAL LOW (ref 13–60)

## 2017-09-18 LAB — PROTIME-INR
INR: 1.2 — ABNORMAL HIGH (ref 0.9–1.1)
Protime: 13.3 s — ABNORMAL HIGH (ref 10.0–12.9)

## 2017-09-18 LAB — PREALBUMIN: Prealbumin: 15 mg/dL — ABNORMAL LOW (ref 20–40)

## 2017-09-18 LAB — MAGNESIUM: Magnesium: 2 mg/dL (ref 1.6–2.5)

## 2017-09-18 LAB — PHOSPHORUS: Phosphorus: 3.9 mg/dL (ref 2.7–4.5)

## 2017-09-18 MED ORDER — DOCUSATE SODIUM 100 MG PO CAPS *I*
200.0000 mg | ORAL_CAPSULE | Freq: Two times a day (BID) | ORAL | Status: DC
Start: 2017-09-18 — End: 2017-09-19
  Administered 2017-09-18 – 2017-09-19 (×2): 200 mg via ORAL
  Filled 2017-09-18 (×2): qty 2

## 2017-09-18 MED ORDER — PANTOPRAZOLE SODIUM 40 MG PO TBEC *I*
40.0000 mg | DELAYED_RELEASE_TABLET | Freq: Two times a day (BID) | ORAL | Status: DC
Start: 2017-09-18 — End: 2017-09-19
  Administered 2017-09-18 – 2017-09-19 (×2): 40 mg via ORAL
  Filled 2017-09-18 (×4): qty 1

## 2017-09-18 MED ORDER — FERROUS SULFATE 325 (65 FE) MG PO TABS *WRAPPED* *I*
325.0000 mg | ORAL_TABLET | Freq: Every day | ORAL | Status: DC
Start: 2017-09-19 — End: 2017-09-19
  Administered 2017-09-19: 325 mg via ORAL
  Filled 2017-09-18 (×2): qty 1

## 2017-09-18 MED ORDER — ENOXAPARIN SODIUM 40 MG/0.4ML IJ SOSY *I*
40.0000 mg | PREFILLED_SYRINGE | Freq: Every day | INTRAMUSCULAR | Status: DC
Start: 2017-09-18 — End: 2017-09-25
  Administered 2017-09-18 – 2017-09-24 (×7): 40 mg via SUBCUTANEOUS
  Filled 2017-09-18 (×7): qty 0.4

## 2017-09-18 MED ORDER — LEVOTHYROXINE SODIUM 150 MCG PO TABS *I*
150.0000 ug | ORAL_TABLET | Freq: Every day | ORAL | Status: DC
Start: 2017-09-18 — End: 2017-09-19
  Administered 2017-09-18 – 2017-09-19 (×2): 150 ug via ORAL
  Filled 2017-09-18 (×3): qty 1

## 2017-09-18 MED ORDER — AMOXICILLIN-POT CLAVULANATE 875-125 MG PO TABS *I*
1.0000 | ORAL_TABLET | Freq: Two times a day (BID) | ORAL | Status: DC
Start: 2017-09-18 — End: 2017-09-19
  Administered 2017-09-18 – 2017-09-19 (×2): 1 via ORAL
  Filled 2017-09-18 (×4): qty 1

## 2017-09-18 MED ORDER — LORAZEPAM 0.5 MG PO TABS *I*
0.2500 mg | ORAL_TABLET | Freq: Every evening | ORAL | Status: DC | PRN
Start: 2017-09-18 — End: 2017-09-19
  Administered 2017-09-18: 0.25 mg via ORAL
  Filled 2017-09-18: qty 1

## 2017-09-18 MED ORDER — SODIUM CHLORIDE 0.9 % 100 ML IV SOLN *I*
12.5000 mg | Freq: Four times a day (QID) | INTRAVENOUS | Status: DC | PRN
Start: 2017-09-18 — End: 2017-09-18

## 2017-09-18 MED ORDER — METOCLOPRAMIDE HCL 10 MG PO TABS *I*
10.0000 mg | ORAL_TABLET | Freq: Four times a day (QID) | ORAL | Status: DC
Start: 2017-09-18 — End: 2017-09-19
  Administered 2017-09-18 – 2017-09-19 (×3): 10 mg via ORAL
  Filled 2017-09-18 (×8): qty 1

## 2017-09-18 MED ORDER — DULOXETINE HCL 20 MG PO CPEP *I*
20.0000 mg | DELAYED_RELEASE_CAPSULE | Freq: Every evening | ORAL | Status: DC
Start: 2017-09-18 — End: 2017-09-19
  Administered 2017-09-18: 20 mg via ORAL
  Filled 2017-09-18 (×2): qty 1

## 2017-09-18 MED ORDER — HYDROMORPHONE HCL 4 MG PO TABS *I*
4.0000 mg | ORAL_TABLET | ORAL | Status: DC | PRN
Start: 2017-09-18 — End: 2017-09-19

## 2017-09-18 MED ORDER — METHADONE HCL 5 MG PO TABS *I*
2.5000 mg | ORAL_TABLET | Freq: Two times a day (BID) | ORAL | Status: DC
Start: 2017-09-18 — End: 2017-09-19
  Administered 2017-09-18 – 2017-09-19 (×2): 2.5 mg via ORAL
  Filled 2017-09-18 (×2): qty 1

## 2017-09-18 MED ORDER — ONDANSETRON HCL 2 MG/ML IV SOLN *I*
4.0000 mg | Freq: Four times a day (QID) | INTRAMUSCULAR | Status: DC | PRN
Start: 2017-09-18 — End: 2017-09-25
  Administered 2017-09-22 (×2): 4 mg via INTRAVENOUS
  Filled 2017-09-18 (×2): qty 2

## 2017-09-18 MED ORDER — PROSOURCE NO CARB PO LIQD *I*
30.0000 mL | Freq: Two times a day (BID) | ORAL | Status: DC
Start: 2017-09-18 — End: 2017-09-19
  Administered 2017-09-18 – 2017-09-19 (×2): 30 mL via ORAL

## 2017-09-18 MED ORDER — ONDANSETRON HCL 2 MG/ML IV SOLN *I*
4.0000 mg | Freq: Three times a day (TID) | INTRAMUSCULAR | Status: AC | PRN
Start: 2017-09-18 — End: 2017-09-19

## 2017-09-18 MED ORDER — LACTOBACILLUS RHAMNOSUS (GG) PO CAPS *I*
1.0000 | ORAL_CAPSULE | Freq: Every day | ORAL | Status: DC
Start: 2017-09-18 — End: 2017-09-19
  Administered 2017-09-18 – 2017-09-19 (×2): 1 via ORAL
  Filled 2017-09-18 (×3): qty 1

## 2017-09-18 MED ORDER — DIPHENHYDRAMINE HCL 50 MG/ML IJ SOLN *I*
25.0000 mg | Freq: Once | INTRAMUSCULAR | Status: AC
Start: 2017-09-18 — End: 2017-09-18
  Administered 2017-09-18: 25 mg via INTRAVENOUS
  Filled 2017-09-18: qty 1

## 2017-09-18 MED ORDER — DEXTROSE 5% AND 0.9% NACL IV SOLN *I*
125.0000 mL/h | INTRAVENOUS | Status: AC
Start: 2017-09-18 — End: 2017-09-20
  Administered 2017-09-18: 125 mL/h
  Administered 2017-09-18: 125 mL/h via INTRAVENOUS
  Administered 2017-09-18 – 2017-09-19 (×3): 125 mL/h
  Administered 2017-09-19: 20 mL/h
  Administered 2017-09-19 (×2): 125 mL/h
  Administered 2017-09-19: 125 mL/h via INTRAVENOUS
  Administered 2017-09-19: 125 mL/h
  Administered 2017-09-19: 20 mL/h
  Administered 2017-09-19: 125 mL/h
  Administered 2017-09-19: 125 mL/h via INTRAVENOUS
  Administered 2017-09-19: 20 mL/h
  Administered 2017-09-19 (×7): 125 mL/h
  Administered 2017-09-19: 125 mL/h via INTRAVENOUS
  Administered 2017-09-19 – 2017-09-20 (×5): 125 mL/h
  Administered 2017-09-20: 125 mL/h via INTRAVENOUS
  Administered 2017-09-20 (×2): 125 mL/h
  Administered 2017-09-20: 125 mL/h via INTRAVENOUS
  Administered 2017-09-20 (×4): 125 mL/h
  Administered 2017-09-20: 125 mL/h via INTRAVENOUS
  Administered 2017-09-20: 10 mL/h
  Administered 2017-09-20 (×5): 125 mL/h

## 2017-09-18 MED ORDER — SODIUM CHLORIDE 0.9 % IV SOLN WRAPPED *I*
100.0000 mL/h | Status: DC
Start: 2017-09-18 — End: 2017-09-25

## 2017-09-18 NOTE — Progress Notes (Signed)
Pt arrived 1500, ambulated from stretcher to bed tolerated well. VSS, no c/o pain or nausea. 4 eyed skin assessment completed, head to toe assessment. Pt oriented to room and to floor. Will continue to monitor, full report given to oncoming nurse. Chrystine Oiler, RN

## 2017-09-18 NOTE — Consults (Addendum)
Gastroenterology and Hepatology Initial Consult    Admit Date:  09/18/2017  Attending Provider:  Charolotte Eke, MD                                  Primary Care Physician:  Provider, None     Consult reason: Concern for Anastomotic Stricture    History of Present Illness:  Amanda Galloway is 72 y.o. female with history of Pancreatic Cancer s/p Whipple (03/29/17) with neoadjuvant chemotherapy complicated by intra-abdominal abscess, and gastrostomy tube-induced anastomotic dehiscence who presents with vomiting and FTT. GI is consulted for concern for anastomotic stricture at the gastric-jejunal anastomosis.    Patient had a very protracted hospital course from 08/29/01-5/00/93 which was complicated by gastric outlet obstruction distal to the gastro-jejunostomy at the efferent limb. She also had cholangitis 2/2 biliary compression from a hematoma which was treated with PTC. In April multiple services were consulted for placement of a PEG however effective windows were difficult to obtain, finally IR placed a peg and this was complicated by gastric perforation. PEG was then downsized on 6/7 to 16 Fr. She had persistent nausea/vomiting after this and she had an EGD on 08/16/17 which showed candida esophagitis and H. Pylori which was treated with triple therapy. She had nausea during this hospitalization and some vomiting starting around May. After her discharge she had persistent vomiting after eating and lying flat, if she did not lie down she did not have any vomiting. Over the last few days she had been stopping eating at least 5 hours prior to sleeping and her vomiting improved. She did have one episode of vomiting without lying down a few days ago however. She denies abdominal pain. No specific foods exacerbate her vomiting, she has been eating mostly a soft diet. Vomit is without blood. She denies fevers/chills.     Patient was seen in the HBP surgery clinic and was noted to have daily vomiting and inability to  tolerate PO. She was sent to [facility] for GI evaluation for endoscopy. VS on presentation were within normal limits. Labs significant for elevated ALT, AST, and Alk Phos but not increased from baseline and H/H of 9.1/29 at baseline. Upper GI with SBFT on 7/12 showed delayed transit through the stomach consistent with functional or mechanical obstruction of the anastomosis.     Past Medical Hx:   Past Medical History:   Diagnosis Date    Acute kidney failure 03/31/2017    Cancer     Depression     Fibromyalgia     Hypothyroidism        Past Surgical Hx:   Past Surgical History:   Procedure Laterality Date    CHOLECYSTECTOMY      CHOLECYSTECTOMY, LAPAROSCOPIC  09/06/2016    HYSTERECTOMY  08/1986    Fibroids    KNEE SURGERY Right     PR INSERT TUNNELED CV CATH WITH PORT Right 10/04/2016    Procedure: Right IJ MEDIPORT Insertion;  Surgeon: Delano Metz, MD;  Location: [facility] MAIN OR;  Service: Oncology General    PR LAP,DIAGNOSTIC ABDOMEN N/A 10/04/2016    Procedure: LAPAROSCOPY DIAGNOSTIC;  Surgeon: Delano Metz, MD;  Location: [facility] MAIN OR;  Service: Oncology General    PR PART [place] PANC,PROX+REMV DUOD+ANAST N/A 03/29/2017    Procedure: WHIPPLE PROCEDURE;  Surgeon: Delano Metz, MD;  Location: [facility] MAIN OR;  Service: Oncology General    ROTATOR CUFF REPAIR Right  TONSILLECTOMY AND ADENOIDECTOMY         Social Hx:   reports that she has never smoked. She has never used smokeless tobacco.   reports that she does not drink alcohol.   reports that she does not use drugs.    Family Hx:  family history includes Breast cancer in her mother; Cancer in her father; Diabetes in her sister; Heart Disease in her father; Obesity in her sister.    Review of Systems  Constitutional: No fevers, fatigue, generalized weakness, or loss of appetite  Eyes: No vision changes, eye pain  Ears, nose, and throat: No epistaxis, sore throat   Cardiovascular: No chest pain, palpitations  Pulmonary: No sputum production, shortness of breath at  rest  Gastrointestinal: See HPI  Genitourinary: No dysuria or hematuria  Musculoskeletal: No joint pain, stiffness  Integumentary: No rashes, lesions  Neurologic: No headaches, loss of extremity strength  Psychiatric: No anxiety, depression    Home Medications:  Prior to Admission medications    Medication Sig Start Date End Date Taking? Authorizing Provider   Enteral Nutrition Supplies (ENTERAL DELIVERY GRAVITY BAG) MISC 1.5 Cans by PEG Tube route daily (with dinner) 09/06/17   Arty Baumgartner, NP   Syringe (B-D SYRINGE CATH TIP 60CC) 60 ML MISC Free water flushes to PEG 09/06/17   Arty Baumgartner, NP   amoxicillin-clavulanate (AUGMENTIN) 875-125 MG per tablet Take 1 tablet by mouth 2 times daily for 30 days 08/23/17 09/22/17  Severiano Gilbert, NP   tube feed formula (TWOCAL HN) 2 cal liquid 1 can TwoCal HN supplemental TF daily via G-tube two times per with 30 ml FWF before and after feed 08/23/17   Severiano Gilbert, NP   rollator Use as directed. 08/23/17   Severiano Gilbert, NP   nystatin (MYCOSTATIN) 100000 unit/mL suspension Take 5 mLs (500,000 Units total) by mouth 4 times daily   Swish and hold in mouth before swallowing. 08/23/17 09/22/17  Severiano Gilbert, NP   Syringe, Disposable, (50-60CC SYRINGE) 60 ML MISC For free water flushes. 08/21/17   Severiano Gilbert, NP   HYDROmorphone (DILAUDID) 4 MG tablet Take 1 tablet (4 mg total) by mouth every 4 hours as needed for Pain   Max daily dose: 24 mg 08/21/17   Severiano Gilbert, NP   Nutritional Supplements (PROSOURCE NO CARB) LIQD liquid Take 30 mLs by mouth 2 times daily (with meals) 08/20/17   Severiano Gilbert, NP   levothyroxine (SYNTHROID, LEVOTHROID) 150 MCG tablet Take 1 tablet (150 mcg total) by mouth daily (before breakfast) 08/20/17 09/19/17  Severiano Gilbert, NP   metoclopramide (REGLAN) 10 MG tablet Take 1 tablet (10 mg total) by mouth 4 times daily (before meals and nightly) 08/20/17 09/19/17  Severiano Gilbert, NP   DULoxetine (CYMBALTA) 20 MG DR  capsule Take 1 capsule (20 mg total) by mouth every evening 08/20/17 09/19/17  Severiano Gilbert, NP   lactobacillus rhamnosus, GG, (CULTURELLE) capsule Take 1 capsule (1 each total) by mouth daily 08/21/17 09/20/17  Severiano Gilbert, NP   pregabalin (LYRICA) 75 MG capsule Take 1 capsule (75 mg total) by mouth 2 times daily   Max daily dose: 150 mg 08/20/17 09/19/17  Severiano Gilbert, NP   sodium chloride 1 GM tablet 1 tablet (1 g total) by Per G Tube route 3 times daily (after meals) 08/20/17 09/19/17  Severiano Gilbert, NP   LORazepam (ATIVAN) 0.5 MG tablet Take 0.5 tablets (0.25  mg total) by mouth nightly as needed for Anxiety (insomnia)   Max daily dose: 0.25 mg 08/20/17   Severiano Gilbert, NP   ondansetron (ZOFRAN-ODT) 4 MG disintegrating tablet Take 1 tablet (4 mg total) by mouth 3 times daily as needed (nausea)   Place on top of tongue. 08/20/17   Severiano Gilbert, NP   pantoprazole (PROTONIX) 40 MG EC tablet Take 1 tablet (40 mg total) by mouth 2 times daily (before meals)   Swallow whole. Do not crush, break, or chew. 08/20/17   Severiano Gilbert, NP   ferrous sulfate 325 (65 FE) MG tablet Take 1 tablet (325 mg total) by mouth daily (with breakfast) 08/21/17   Severiano Gilbert, NP   docusate sodium (COLACE) 100 MG capsule Take 2 capsules (200 mg total) by mouth 2 times daily 11/02/16   Hellems, Guinevere A, PA   senna (SENOKOT) 8.6 MG tablet Take 2 tablets by mouth 2 times daily 11/02/16   Hellems, Guinevere A, PA   PREMARIN 0.625 MG tablet Take 0.625 mg by mouth every morning    07/10/16   [provider]   Vitamins - senior (CENTRUM SILVER) TABS Take 1 tablet by mouth daily  Patient taking differently: Take 1 tablet by mouth every morning    10/14/16   Timmothy Sours, NP       Allergies/Sensitivities:   Allergies as of 09/18/2017    (No Known Allergies (drug, envir, food or latex))       PHYSICAL EXAM:  Blood pressure 126/60, pulse 86, temperature 35.9 C (96.6 F), temperature source  Temporal, resp. rate 16, height 170.2 cm ('5\' 7"' ), weight 62.1 kg (137 lb), SpO2 98 %.    General appearance: alert, awake, cooperative  Eye: EOM, anicteric  Ears, Nose, Mouth and throat: moist mucous membranes, no oral lesions  Neck: supple, symmetrical, trachea midline   Cardiovascular: Regular S1 and S2  Respiratory: on room air, clear to auscultation bilaterally.    Gastrointestinal: Soft, nontender, nondistended, good bowel sounds. PEG site clean and dry.    Skin: no lesions or jaundice  Neurological: Speaks in full sentences, moves extremities  Extremities: warm and dry. No edema    LAB DATA:    Recent Labs  Lab 09/18/17  0956   WBC 8.3   Hematocrit 29*   MCV 92   Platelets 558*       Recent Labs  Lab 09/18/17  0956   Sodium 135   Potassium 4.6   Chloride 94*   CO2 23   UN 12   Creatinine 0.73       Recent Labs  Lab 09/18/17  0956   Alk Phos 684*   Bilirubin,Total 0.4   Albumin 4.0   ALT 43*   AST 66*       No components found with this basename: PROT   No results for input(s): AMY, LIP in the last 168 hours.  No results for input(s): INR in the last 168 hours.    No components found with this basename: APT  No results for input(s): CRP, ESR in the last 168 hours.  No results for input(s): LAC in the last 168 hours.    IMAGING:  No results found.    Scheduled Meds:   amoxicillin-clavulanate  1 tablet Oral BID    docusate sodium  200 mg Oral BID    DULoxetine  20 mg Oral QPM    lactobacillus rhamnosus (GG)  1 capsule Oral Daily    [  START ON 09/19/2017] ferrous sulfate  325 mg Oral Daily with breakfast    levothyroxine  150 mcg Oral Daily    metoclopramide  10 mg Oral 4x Daily AC & HS    PROSOURCE NO CARB  30 mL Oral BID WC    pantoprazole  40 mg Oral BID AC    methadone  2.5 mg Oral BID     Continuous Infusions:   dextrose 5 % and 0.9% NaCl       PRN Meds:.   ondansetron  4 mg Intravenous Q6H PRN    promethazine  12.5 mg Intravenous Q6H PRN    HYDROmorphone  4 mg Oral Q4H PRN    LORazepam  0.25 mg  Oral QHS PRN       Pertinent Past GI History and Procedures:  EGD 08/16/17:  Impression: No overt outlet obstruction. Suspect gastroparesis.   Mild candida esophagitis proximally    ASSESSMENT/RECOMMENDATIONS:  72 y.o. female with history of Pancreatic Cancer s/p Classic Whipple (03/29/17) with neoadjuvant chemotherapy complicated by intra-abdominal abscess, and gastrostomy tube-induced anastomotic dehiscence who presents with vomiting and FTT. GI is consulted for concern for anastomotic stricture at the gastric-jejunal anastomosis. We can rule out mechanical obstruction with an EGD which we will schedule for tomorrow. Further considerations would be gastroparesis (which had been noted in the past) and which she is at risk for given her surgical history as well as possible obstruction in the past. If mechanical obstruction is ruled out we may need to trial other pharmacologic approaches to gastroparesis.    Concern for Obstruction of Gastro-Jejunal Anastomosis:  - Plan for EGD in the AM   - Please make NPO at midnight  - Pending results of EGD we may need to discuss other options for gastroparesis treatment    Case to be discussed with consult attending.    Thank you for asking the GI Consult Service to participate in your patient's care. Please update [name] with any change in clinical status.    Brett Canales, MD on 09/18/2017 at 4:25 PM. PGY4 GI Fellow [phone]    GI ATTENDING  I saw and evaluated the patient. I agree with the resident's/fellow's findings and plan of care as documented above.    Roanna Raider, MD

## 2017-09-18 NOTE — H&P (Addendum)
HPB Surgery H&P Note    Patient: Amanda Galloway  LOS: 0 days  Attending: Dr. Dennard Schaumann        CC: [name] admitted for FTT     HPI: Nevia Henkin is a 72 y.o. female with h/o whipple on 7/82/42 with complicated hospital stay resulting in PEG tube placement, several intra-abdominal and pleural drains, and multiple visits to the SICU with a final discharge on 08/24/17. She was admitted from clinic after  presenting today for second postoperative visit. She has called the office with several concerns. Nausea & intermittent vomiting have been her major issues. We recommended an upper GI which showed a stricture.     Her & daughter reports she has been having several episodes of emesis per day. Approximately 1L per episode, and all undigested food. Unable to give self enteral feedings. Due to nausea, vomiting and leaking from tube insertion site. No fevers, chills, abdominal pain, constipation or diarrhea. Daughter reports has been taking minimal PO intake (300 to 400 calories per day) and has lost 10 pounds since last seen. Currently she is taking 2.5 mg methadone BID which keeps her pain at bay. Daughter also reports patient has not been able to ambulate frequently give the fact she is weak. Her energy level is poor and spends most of her days sitting in her couch.    Past Medical History:   Past Medical History:   Diagnosis Date    Acute kidney failure 03/31/2017    Cancer     Depression     Fibromyalgia     Hypothyroidism        Past Surgical History:   Past Surgical History:   Procedure Laterality Date    CHOLECYSTECTOMY      CHOLECYSTECTOMY, LAPAROSCOPIC  09/06/2016    HYSTERECTOMY  08/1986    Fibroids    KNEE SURGERY Right     PR INSERT TUNNELED CV CATH WITH PORT Right 10/04/2016    Procedure: Right IJ MEDIPORT Insertion;  Surgeon: Delano Metz, MD;  Location: [facility] MAIN OR;  Service: Oncology General    PR LAP,DIAGNOSTIC ABDOMEN N/A 10/04/2016    Procedure: LAPAROSCOPY DIAGNOSTIC;  Surgeon: Delano Metz, MD;   Location: [facility] MAIN OR;  Service: Oncology General    PR PART [place] PANC,PROX+REMV DUOD+ANAST N/A 03/29/2017    Procedure: WHIPPLE PROCEDURE;  Surgeon: Delano Metz, MD;  Location: [facility] MAIN OR;  Service: Oncology General    ROTATOR CUFF REPAIR Right     TONSILLECTOMY AND ADENOIDECTOMY         Medications:  Current Facility-Administered Medications   Medication    Dextrose 5 %- Sodium Chloride 0.9 %    ondansetron (ZOFRAN) injection 4 mg    promethazine (PHENERGAN) 12.5 mg in sodium chloride 0.9% 25 mL IVPB    amoxicillin-clavulanate (AUGMENTIN) 875-125 MG per tablet 1 tablet    docusate sodium (COLACE) capsule 200 mg    DULoxetine (CYMBALTA) DR capsule 20 mg    lactobacillus rhamnosus (GG) (CULTURELLE) capsule 1 each    [START ON 09/19/2017] ferrous sulfate tablet 325 mg    HYDROmorphone (DILAUDID) tablet 4 mg    levothyroxine (SYNTHROID, LEVOTHROID) tablet 150 mcg    LORazepam (ATIVAN) tablet 0.25 mg    metoclopramide (REGLAN) tablet 10 mg    PROSOURCE NO CARB liquid LIQD 30 mL    pantoprazole (PROTONIX) EC tablet 40 mg       Allergies:   No Known Allergies (drug, envir, food or latex)  Family History:   family history includes Breast cancer in her mother; Cancer in her father; Diabetes in her sister; Heart Disease in her father; Obesity in her sister.    Social History:   Social History   Substance Use Topics    Smoking status: Never Smoker    Smokeless tobacco: Never Used    Alcohol use No        Review of Systems   Constitutional: Positive for malaise/fatigue and weight loss. Negative for chills and fever.   HENT: Negative for hearing loss.    Eyes: Negative for blurred vision.   Respiratory: Negative for cough and shortness of breath.    Cardiovascular: Negative for chest pain, palpitations and leg swelling.   Gastrointestinal: Positive for abdominal pain, nausea and vomiting. Negative for blood in stool, constipation, diarrhea and heartburn.   Genitourinary: Negative for frequency.   Skin:  Negative for rash.   Neurological: Positive for weakness.   Endo/Heme/Allergies: Does not bruise/bleed easily.   Psychiatric/Behavioral: Negative for memory loss. The patient is nervous/anxious.      Physical Examination:  Vitals:  Temp:  [35.9 C (96.6 F)-36.2 C (97.2 F)] 35.9 C (96.6 F)  Heart Rate:  [86-94] 86  Resp:  [16] 16  BP: (106-126)/(55-60) 126/60  Height: 170.2 cm (_0 ) Weight: 62.1 kg (137 lb)  Constitutional: She is oriented to person, place, and time. She appears well-developed and well-nourished. No distress.   HENT:   Head: Normocephalic.   Eyes: Pupils are equal, round, and reactive to light.   Neck: Normal range of motion.   Cardiovascular: Normal rate, regular rhythm, normal heart sounds and intact distal pulses.  Exam reveals no gallop and no friction rub.    No murmur heard.  Pulmonary/Chest: Effort normal and breath sounds normal. No respiratory distress. She has no wheezes. She has no rales.   Abdominal: Soft. Bowel sounds are normal. She exhibits no distension and no mass. There is no tenderness. There is no rebound and no guarding. PEG tube with green/brown drainage    Lab Results:  Laboratory values:   Recent Labs      09/18/17   0956   WBC  8.3   Hemoglobin  9.1*   Hematocrit  29*   Platelets  558*     No components found with this basename: APTT, PT Recent Labs      09/18/17   0956   Sodium  135   Potassium  4.6   Chloride  94*   CO2  23   UN  12   Creatinine  0.73   Glucose  111*   Calcium  9.6    Recent Labs      09/18/17   0956   AST  66*   ALT  43*   Alk Phos  684*   Bilirubin,Total  0.4   Total Protein  8.3*   Albumin  4.0   Prealbumin  15*      Imaging:   Upper Gi  With Small Bowel Follow Through No Air (post Operative)    Result Date: 09/13/2017  1. Delayed transit of contrast in the stomach, indicating potentially function or mechanical obstruction of the gastrointestinal anastomosis. Recommend an endoscopy for further evaluation. END OF IMPRESSION I have personally  reviewed the images and the Resident's/Fellow's interpretation and agree with or edited the findings. [facility] submits this DICOM format image data and final report to the [facility], an independent secure electronic health information exchange, on  a reciprocally searchable basis (with patient authorization) for a minimum of 12 months after exam date.       ASSESSMENT  Daphna Lafuente is a 72 y.o. old female with pancreatic cancer who underwent pancreaticoduodenectomy after neoadjuvant chemotherapy. Her post operative course was complicated by intra-abdominal abscess, abdominal pain, gastrostomy tube induced gastric anastomotic dehiscence and prolonged hospitalization. Admitted for FTT.     PLAN   GI consult for endoscopy   Diet NPO time specified diet, MIVF   Analgesia with home regime   Bowel regimen   Home meds   OOB/ambulate   DVT ppx   Dispo: pending clinical course    Author: Dennard Nip, PA as of: 09/18/2017  at: 4:04 PM     HPB and GI Surgery Attending:    I saw and evaluated the patient. I have reviewed and edited the resident's/fellow's note and confirm the findings and plan of care as documented above. DGE    Charolotte Eke, MD

## 2017-09-18 NOTE — Progress Notes (Signed)
[place]  Outpatient Nutrition Follow-up     Brief Oncology History:  Glendy Barsanti is a 72 y.o. female, with pancreatic cancer s/p Whipple on 2/72/53 complicated by rising LFTs and PV compression due a hematoma and afferent limb distention in the setting of PE treatment. Required an ICU stay with intubation for acute respiratory failure. Also, developed severe constipation and intraabdominal abscesses s/p IR drainage, and found to have delayed gastric emptying. PEG placed by IR on 06/14/17, then was exchanged on 08/09/17 after it was leaking. Discharged home on 6/23.     Medications:  has a current medication list which includes the following prescription(s): enteral delivery gravity bag, syringe, amoxicillin-clavulanate, tube feed formula, rollator, nystatin, 50-60cc syringe, hydromorphone, prosource no carb, levothyroxine, metoclopramide, duloxetine, lactobacillus rhamnosus (gg), pregabalin, sodium chloride, lorazepam, ondansetron, pantoprazole, ferrous sulfate, docusate sodium, senna, premarin, and vitamins - senior.    Labs: 09/18/17, reviewed    Enteral Nutrition:  Route: PEG  Formula: TwoCal HN  Obtained from: [facility] Allergies/Intolerance:  NKFA  Physical Activity: sedentary    Nutrition-Related Side Effects:   Anorexia: Yes    Dysphagia: No   Odynophagia: No   Pain: No   Taste/smell changes: No   Xerostomia: No   Mucositis: No   Nausea: Yes   Vomiting: Yes   Early satiety: Yes   BM's: a bowel movement every other day    Anthropometrics:     Wt Readings from Last 8 Encounters:   09/02/17 66.4 kg (146 lb 7.9 oz)   08/20/17 70 kg (154 lb 6.4 oz)   05/14/17 64.9 kg (143 lb)   03/22/17 71.2 kg (157 lb)   03/11/17 73 kg (160 lb 15 oz)   03/06/17 72.3 kg (159 lb 8 oz)   03/01/17 73.8 kg (162 lb 9.6 oz)   02/19/17 72.6 kg (160 lb)      09/18/17 62.3 kg (137 lbs.) on my office scale     Estimated body mass index is 23.88 kg/m as calculated from the following:    Height as of 09/02/17: 166.8 cm (5'  5.67").    Weight as of 09/02/17: 66.4 kg (146 lb 7.9 oz).    Ideal Body Weight: 72.4 kg +/- 10%; 86% IBW  Usual Body Weight: 72.7kg; 86% UBW    Estimated Nutrient Needs:  (based on 66.4 kg)  2000-2650 kcal (30-40 kcal/kg) promote weight gain   80-100 g pro (1.2-1.5 g/kg)  2-3 L fluid     Nutrition Assessment:   Minimal PO and enteral intake. Emesis following intake, reported eating 2 eggs and 3 salt potatoes at 9AM yesterday and vomiting this up at 1:45PM. Never received gravity bags for tube feeding, checked with [facility], they did receive the order, but were unable to reach patient and therefore did not send out supplies. Following up with GI after our appointment, doing poorly and will hopefully be admitted. If not, recommend changing to a lower fat tube feeding formula and using a pump to infuse at slow rate, is prescribed Reglan but not sure if taking.      Significant weight loss:  9 lbs (6%) x 2 weeks     Nutrition Intervention/Patient Education:   Nutrition Intervention/Education:   Follow-up with GI and make recommendations based on outcome of that appointment     Nutrition Monitoring/Evaluation:   1. Will monitor diet tolerance and intake, nutrition-related labs, weight trend, nutrition-related side effects.  2. Follow up: Monitor closely, follow-up as needed  [place], [place], [place]  Office: [phone]  Pager: [phone]  Appts: [phone]  PIC: [phone]      Face to Face time spent: 30 minutes

## 2017-09-19 ENCOUNTER — Inpatient Hospital Stay: Payer: Medicare (Managed Care) | Admitting: Gastroenterology

## 2017-09-19 LAB — COMPREHENSIVE METABOLIC PANEL
ALT: 37 U/L — ABNORMAL HIGH (ref 0–35)
AST: 57 U/L — ABNORMAL HIGH (ref 0–35)
Albumin: 3.3 g/dL — ABNORMAL LOW (ref 3.5–5.2)
Alk Phos: 558 U/L — ABNORMAL HIGH (ref 35–105)
Anion Gap: 13 (ref 7–16)
Bilirubin,Total: 0.3 mg/dL (ref 0.0–1.2)
CO2: 21 mmol/L (ref 20–28)
Calcium: 8.7 mg/dL (ref 8.6–10.2)
Chloride: 102 mmol/L (ref 96–108)
Creatinine: 0.63 mg/dL (ref 0.51–0.95)
GFR,Black: 104 *
GFR,Caucasian: 90 *
Glucose: 126 mg/dL — ABNORMAL HIGH (ref 60–99)
Lab: 13 mg/dL (ref 6–20)
Potassium: 3.8 mmol/L (ref 3.3–5.1)
Sodium: 136 mmol/L (ref 133–145)
Total Protein: 7.2 g/dL (ref 6.3–7.7)

## 2017-09-19 LAB — EKG 12-LEAD
P: -11 deg
PR: 179 ms
QRS: 44 deg
QRSD: 86 ms
QT: 392 ms
QTc: 461 ms
Rate: 83 {beats}/min
T: 61 deg

## 2017-09-19 LAB — PHOSPHORUS: Phosphorus: 3.8 mg/dL (ref 2.7–4.5)

## 2017-09-19 LAB — CBC
Hematocrit: 24 % — ABNORMAL LOW (ref 34–45)
Hemoglobin: 7.7 g/dL — ABNORMAL LOW (ref 11.2–15.7)
MCH: 29 pg/cell (ref 26–32)
MCHC: 32 g/dL (ref 32–36)
MCV: 92 fL (ref 79–95)
Platelets: 399 10*3/uL — ABNORMAL HIGH (ref 160–370)
RBC: 2.6 MIL/uL — ABNORMAL LOW (ref 3.9–5.2)
RDW: 16.8 % — ABNORMAL HIGH (ref 11.7–14.4)
WBC: 9.2 10*3/uL (ref 4.0–10.0)

## 2017-09-19 LAB — MAGNESIUM: Magnesium: 1.9 mg/dL (ref 1.6–2.5)

## 2017-09-19 MED ORDER — SODIUM CHLORIDE 0.9 % IV SOLN WRAPPED *I*
500.0000 mg | Freq: Four times a day (QID) | INTRAMUSCULAR | Status: DC
Start: 2017-09-19 — End: 2017-09-25
  Administered 2017-09-19 – 2017-09-25 (×22): 500 mg via INTRAVENOUS
  Filled 2017-09-19 (×30): qty 500

## 2017-09-19 MED ORDER — MIDAZOLAM HCL 5 MG/5ML IJ SOLN *I*
INTRAMUSCULAR | Status: AC | PRN
Start: 2017-09-19 — End: 2017-09-19
  Administered 2017-09-19: 2 mg via INTRAVENOUS
  Administered 2017-09-19 (×2): 1 mg via INTRAVENOUS
  Administered 2017-09-19: 2 mg via INTRAVENOUS

## 2017-09-19 MED ORDER — METOCLOPRAMIDE HCL 5 MG/ML IJ SOLN *I*
10.0000 mg | Freq: Four times a day (QID) | INTRAMUSCULAR | Status: DC
Start: 2017-09-19 — End: 2017-09-20
  Administered 2017-09-19 – 2017-09-20 (×3): 10 mg via INTRAVENOUS
  Filled 2017-09-19 (×3): qty 2

## 2017-09-19 MED ORDER — METHADONE HCL 10 MG/ML IJ SOLN *I*
1.2500 mg | Freq: Two times a day (BID) | INTRAMUSCULAR | Status: DC
Start: 2017-09-19 — End: 2017-09-19
  Filled 2017-09-19: qty 0.13

## 2017-09-19 MED ORDER — FENTANYL CITRATE 50 MCG/ML IJ SOLN *WRAPPED*
INTRAMUSCULAR | Status: AC | PRN
Start: 2017-09-19 — End: 2017-09-19
  Administered 2017-09-19: 50 ug via INTRAVENOUS
  Administered 2017-09-19: 25 ug via INTRAVENOUS

## 2017-09-19 MED ORDER — SODIUM CHLORIDE 0.9 % IV SOLN WRAPPED *I*
1.2500 mg | Freq: Two times a day (BID) | INTRAMUSCULAR | Status: AC
Start: 2017-09-19 — End: 2017-09-22
  Administered 2017-09-19 – 2017-09-22 (×6): 1.3 mg via INTRAVENOUS
  Filled 2017-09-19 (×13): qty 0.13

## 2017-09-19 MED ORDER — LEVOTHYROXINE SODIUM 100 MCG IV SOLR *I*
75.0000 ug | Freq: Every day | INTRAVENOUS | Status: DC
Start: 2017-09-20 — End: 2017-09-25
  Administered 2017-09-20 – 2017-09-25 (×6): 75 ug via INTRAVENOUS
  Filled 2017-09-19 (×8): qty 3.75

## 2017-09-19 MED ORDER — LORAZEPAM 2 MG/ML IJ SOLN *I*
0.5000 mg | Freq: Every evening | INTRAMUSCULAR | Status: DC | PRN
Start: 2017-09-19 — End: 2017-09-22
  Administered 2017-09-19 – 2017-09-21 (×3): 0.5 mg via INTRAVENOUS
  Filled 2017-09-19 (×3): qty 1

## 2017-09-19 MED ORDER — HYDROMORPHONE HCL 2 MG/ML IJ SOLN *WRAPPED*
0.5000 mg | INTRAMUSCULAR | Status: DC | PRN
Start: 2017-09-19 — End: 2017-09-22
  Administered 2017-09-21 – 2017-09-22 (×3): 0.5 mg via INTRAVENOUS
  Filled 2017-09-19 (×3): qty 1

## 2017-09-19 MED ORDER — PANTOPRAZOLE SODIUM 40 MG IV SOLR *I*
40.0000 mg | INTRAVENOUS | Status: DC
Start: 2017-09-19 — End: 2017-09-24
  Administered 2017-09-19 – 2017-09-23 (×5): 40 mg via INTRAVENOUS
  Filled 2017-09-19 (×5): qty 10

## 2017-09-19 MED ORDER — METHADONE HCL 10 MG/ML IJ SOLN *I*
2.5000 mg | Freq: Two times a day (BID) | INTRAMUSCULAR | Status: DC
Start: 2017-09-19 — End: 2017-09-19

## 2017-09-19 MED ORDER — FENTANYL CITRATE 50 MCG/ML IJ SOLN *WRAPPED*
INTRAMUSCULAR | Status: AC
Start: 2017-09-19 — End: 2017-09-19
  Filled 2017-09-19: qty 4

## 2017-09-19 MED ORDER — MIDAZOLAM HCL 5 MG/5ML IJ SOLN *I*
INTRAMUSCULAR | Status: AC
Start: 2017-09-19 — End: 2017-09-19
  Filled 2017-09-19: qty 10

## 2017-09-19 MED ORDER — DIPHENHYDRAMINE HCL 50 MG/ML IJ SOLN *I*
25.0000 mg | Freq: Once | INTRAMUSCULAR | Status: AC
Start: 2017-09-20 — End: 2017-09-19
  Administered 2017-09-19: 25 mg via INTRAVENOUS
  Filled 2017-09-19: qty 1

## 2017-09-19 NOTE — Interdisciplinary Rounds (Addendum)
Interdisciplinary Rounds Note    Date: 09/19/2017   Time: 6:13 PM   Attendance:  Advance Practice Provider, Care Coordinator, Community Health Nurse, Physical Therapist, Registered Dietitian, Registered Nurse and Social Worker    Admit Date/Time:  09/18/2017  2:54 PM    Principal Problem: <principal problem not specified>  Problem List:   Patient Active Problem List    Diagnosis Date Noted    Sepsis 04/19/2017     Priority: High     - Afebrile, WBC normalized   - Started on Linezolid 600 mg IV twice daily 5/2  (in place of Daptomycin) watching carefully for serotonin syndrome given she was on methadone (d/c'd on 4/23)), Continue meropenem 1 gram IV every 8 hours and caspofungin 50 mg IV daily  - Followed by ID: the change in patient status is suggestive of active infection, less likely pneumonia. It is not clear if sepsis 2/2 intraabdominal source. CT of a/p 5/1 showed collections have overall improved and liver abscess successfully drained. 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 Ron Agee  [facility] course c/b gastric outlet obstruction d/t severe compression of the main portal vein and common bile duct by hematoma, ICU & intub (extub 2/17), mult IR procedures for drainage of abscesses, VRE bacteremia w/ mediport removal 4/8, s/p R pleural pigtail; s/p PICC after neg bld cx; s/p IR PEG on 4/12, and follow up CT & UGI w/ revealing posterior gastric perforation, s/p IR perc drain x 2 & 4/19 CT revealed oral contrast passing into lesser sac/retroperitoneum  - Continue TPN, with SSI for glucose control, Keep NPO for now. Keep G-tube to gravity  - IR drain replacement 5/3 -> 8 French drain into right intra-hepatic abscess. Communication with the adjacent bile ducts (infected biloma).   - ID following for multiple  intra-abdominal fluid collections:    - s/p enterobacter cloacae bacteremia, Currently cultures + VRE, C.Glabrata and Enterobacter -> D/C Daptomycin and ertapenem ->  Started on Linezolid 600 mg IV twice daily 5/2  (in place of Daptomycin) watching carefully for serotonin syndrome given she was on methadone (d/c'd on 4/23)), Continue meropenem 1 gram IV every 8 hours and caspofungin 50 mg IV daily      Acute pulmonary insufficiency 04/16/2017     Priority: Medium     - Intubated 2/12 prior to [facility] placement, extubated 2/17   - Weaning NC oxygen  - Chest x ray 5/2 showed Moderate bilateral pleural effusions with bibasilar opacities which may represent atelectasis or pneumonia. Superimposed pulmonary edema.   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 No report of previous PE   - This hospitalization has been on therapeutic Lovenox, and Heparin gtt Off. Has completed 6 months of anticoagulation   - [name] left UE 5/2 -> No evidence of venous thrombosis in the visualized deep veins left upper extremity. There is limited evaluation of the lower portion of the left brachial vein due to overlying bandages  - Neuro protocol hep gtt      Anemia 03/30/2017     Priority: Low     Class: Acute     - hct 31  - monitor  - JP s/s      Hypothyroidism 03/29/2017     Priority: Low     - Synthroid IV while NPO      Failure to thrive in adult 09/18/2017    Malignant neoplasm of head of pancreas 10/04/2016       The patient's problem list  and interdisciplinary care plan was reviewed.    Discharge Planning  *Does patient currently have home care services?: Yes   If yes, which agency?: [facility]  *Current External Services: Rehab     7/17 Direct admit from clinic with Nausea &intermittent vomiting have been her major issues. We recommended an upper GI which showed a stricture.       Plan    7/18 EGD today, but unable to fully complete due to food obstructing the view. Will need to reattempt. PEG put to LIWS per GI recommendations. Patient currently open to [facility] for homecare needs.    7/19 ERCP performed yesterday, PICC placed and TPN started. PEG to LIWS irrigating every 4. PT eval, cleared with home PT.   Choiced to [facility].     7/21 Feeling same today, low mood/fatigue. Pain is well controlled, voiding.   EGD scheduled for Monday.     7/22 Patient currently on TPN.  When to GI today to evaluate for gastric outlet obstruction. Previously 2 EGD's on 7/18 and 7/19 with retained food debris and barium limiting evaluation for GOO.  PEG placed to LIWS to help with debris and barium removal.     7/23 PEG removed yesterday, this will hopefully relieve her outlet obstruction. Bloated with CLD last evening. Had a migraine last evening as well. Pt has told staff she is leaving today.    7/24 Na still low, going home on Na replacement. Ride on the way. PICC being removed. Low residue diet. Home today with services.    Anticipated Discharge Date:     Discharge Disposition: Home with Services

## 2017-09-19 NOTE — Progress Notes (Addendum)
HPB Surgery Progress Note    Patient: Amanda Galloway    LOS: 1 days    Attending: Charolotte Eke, MD      INTERVAL HISTORY & SUBJECTIVE  Amanda Galloway is a 72 year old female with a pmh of whipple with prolonged hospital stay in 04/04/17 who presents for FTT and emesis.    Patient denies pain. She is unable to hold down food.     GI to take her for endoscopy this morning.      OBJECTIVE  Physical Exam:  Temp:  [35.9 C (96.6 F)-37.1 C (98.8 F)] 36.5 C (97.7 F)  Heart Rate:  [78-94] 79  Resp:  [16] 16  BP: (100-126)/(55-84) 100/62     GEN: no apparent distress  HEENT: NCAT, face symmetric  CHEST: nonlabored respirations  ABD: soft, nondistended, nontender. Midline incision healed  NEURO/MOTOR: alert, appropriate  EXTREMITIES: atraumatic  VASCULAR: feet wwp, no edema    Intake/Output:  07/17 0700 - 07/18 0659  In: 1668.5 [P.O.:240; I.V.:1428.5]  Out: 150 [Urine:150]     Medications:  Current Facility-Administered Medications   Medication    Dextrose 5 %- Sodium Chloride 0.9 %    ondansetron (ZOFRAN) injection 4 mg    amoxicillin-clavulanate (AUGMENTIN) 875-125 MG per tablet 1 tablet    docusate sodium (COLACE) capsule 200 mg    DULoxetine (CYMBALTA) DR capsule 20 mg    lactobacillus rhamnosus (GG) (CULTURELLE) capsule 1 each    ferrous sulfate tablet 325 mg    HYDROmorphone (DILAUDID) tablet 4 mg    levothyroxine (SYNTHROID, LEVOTHROID) tablet 150 mcg    LORazepam (ATIVAN) tablet 0.25 mg    metoclopramide (REGLAN) tablet 10 mg    PROSOURCE NO CARB liquid LIQD 30 mL    pantoprazole (PROTONIX) EC tablet 40 mg    methadone (DOLOPHINE) tablet 2.5 mg    enoxaparin (LOVENOX) injection 40 mg         Laboratory values:   Recent Labs      09/18/17   2330  09/18/17   1746   WBC  7.0  7.8   Hemoglobin  7.2*  8.8*   Hematocrit  23*  28*   Platelets  425*  516*   INR  1.2*   --      No components found with this basename: APTT, PT Recent Labs      09/18/17   2330  09/18/17   1746   Sodium  136  136   Potassium   3.8  4.1   Chloride  102  96   CO2  21  24   UN  13  13   Creatinine  0.63  0.66   Glucose  126*  103*   Calcium  8.7  9.4   Magnesium  1.9  2.0   Phosphorus  3.8  3.9    Recent Labs      09/18/17   2330  09/18/17   1746   AST  57*  59*   ALT  37*  42*   Alk Phos  558*  663*   Bilirubin,Total  0.3  0.4     Recent Labs      09/18/17   2330  09/18/17   1746  09/18/17   0956   Total Protein  7.2  8.4*  8.3*   Albumin  3.3*  4.0  4.0   Prealbumin   --    --   15*     Recent Labs  09/18/17   1746   Amylase  95   Lipase  8*      GLUCOSE: No results for input(s): PGLU in the last 72 hours.    Imaging: No results found.     ASSESSMENT  Amanda Galloway is a 72 y.o. female with h/o of whipple and prolonged hospital stay who is here for FTT and inability to retain food.     PLAN   GI to take for EGD in AM, mechanical obstruction vs gastroparesis. Greatly appreciated   Diet NPO time specified,   Analgesia, antiemetics prn   Home meds  Dispo: [facility]W-2  Author: Wilburn Cornelia, MD as of: 09/19/2017  at: 6:22 AM     HPB and GI Surgery Attending:    I saw and evaluated the patient. I have reviewed and edited the resident's/fellow's note and confirm the findings and plan of care as documented above. DGE, otherwise well.    Charolotte Eke, MD

## 2017-09-19 NOTE — Procedures (Addendum)
EGD Procedure Note   Date of Procedure: 09/19/2017    Referring Physician: Charolotte Eke, MD   Primary Care Provider: Provider, None   Attending Physician: Roanna Raider, MD  Fellow: Conan Bowens, MD  Indication(s): Evaluation for gastric outlet obstruction     Medications: Fentanyl 75 mcg IV and Midazolam 6 mg IV were administered incrementally over the course of the procedure to achieve an adequate level of conscious sedation.  Moderate Sedation Face Times  Start Time: 4132  End Time: 1109  Duration (minutes): 18 Minutes    Assessment, ordering, supervision and monitoring of moderate sedation was performed by the endoscopy attending throughout the case from the first dose of sedation until patient left procedural area.    Endoscope: [id number]  Accessories:None    Procedure Description: Full disclosure of risks were reviewed with patient as detailed on the consent form. The patient was placed in the left lateral decubitus position and monitored with continuous pulse oximetry and ECG tracing, interval blood pressure monitoring and direct observations.   After adequate sedation, the endoscope was carefully introduced into the oropharynx and passed in to the esophagus under direct visualization. The esophagus and GE junction were carefully examined. After advancement of the endoscope into the stomach, a careful examination was performed, including views of the antrum, incisura angularis, corpus, with forward view of the fundus and cardia.   The pylorus was then intubated without any difficulty and the endoscope was advanced to the second portion of the duodenum. Careful examination of the second portion of the duodenum and the bulb was then performed.  The stomach was then decompressed and the endoscope withdrawn.   Findings and intervention(s) are detailed below. The patient was recovered in the GI recovery area    Findings:     Esophagus:   Normal appearing proximal and mid-esophagus    Irregular  appearing Z-line, but without signs of salmon colored mucosa    Otherwise normal GE junction       Stomach:   Large amount of liquid and semi-solid food noted as scope was passed into the stomach. This obscured our view of the gastric body. A PEG tube was noted Stomach insufflated appropriately.    Gastrojejunal anastomotic suture line appeared widely patent and without signs of stricturing   Opening to the afferent limb was visualized in a small bowel fold adjacent to the PEG tube balloon.    Unable to visualize opening of the efferent limb due to food matter obscuring view       Small bowel:   Afferent limb was deeply intubated to the choledochal anastomosis, which was obvious, and bilious secretions were prominent. There were no mucosal abnormalities noted in the afferent limb.    Unable to intubate the efferent limb due to food contents obscuring view     Intervention(s):   None    Complication(s):   none    EBL: 0 ml    Impression(s):  1. Widely patent gastrojejunal anastomosis, however, unable to examine the efferent limb - so we cannot rule out whether there is stricture or other anatomical abnormality there that could be causing her gastric outlet obstruction  2. Normal appearing afferent limb     Recommendation(s):   Please connect the PEG tube to low intermittent wall suction so that gastric contents can be evacuated for better view of efferent limb    Give her IV reglan 10mg  q 6 hours x 24 hours. (NOT oral)   Please keep patient NPO after  midnight - will plan for repeating EGD tomorrow     Histopathologic Diagnosis:   none    Mohammed Solmon Ice, MD   Gastroenterology and Hepatology Fellow [facility])    GI ATTENDING  I was present during the entire viewing portion of this endoscopy procedure, including from the moment of scope insertion until the completion of the scope withdrawal.  I agree with the fellow's documentation of the procedure.  Dr. Talitha Givens

## 2017-09-19 NOTE — Progress Notes (Signed)
09/19/17 0900   UM Patient Class Review   Patient Class Review Inpatient   Patient Class Effective 09/18/2017  Earnest Conroy RN   Utilization  Management  X [phone]  Page [phone]

## 2017-09-19 NOTE — Plan of Care (Signed)
Pain/Comfort     Patient's pain or discomfort is manageable Progressing towards goal        Psychosocial     Demonstrates ability to cope with illness Progressing towards goal          Cognitive function     Cognitive function will be maintained or return to baseline Maintaining        Mobility     Functional status is maintained or improved - Geriatric Maintaining        Nutrition     Nutritional status is maintained or improved - Geriatric Maintaining        Safety     Patient will remain free of falls Maintaining

## 2017-09-19 NOTE — Preop H&P (Signed)
OUTPATIENT PRE-PROCEDURE H&P    Chief Complaint / Indications for Procedure: evaluate gastric outlet  Past Medical History:     Past Medical History:   Diagnosis Date    Acute kidney failure 03/31/2017    Cancer     Depression     Fibromyalgia     Hypothyroidism      Past Surgical History:   Procedure Laterality Date    APPENDECTOMY      CHOLECYSTECTOMY      CHOLECYSTECTOMY, LAPAROSCOPIC  09/06/2016    HYSTERECTOMY  08/1986    Fibroids    KNEE SURGERY Right     PR INSERT TUNNELED CV CATH WITH PORT Right 10/04/2016    Procedure: Right IJ MEDIPORT Insertion;  Surgeon: Delano Metz, MD;  Location: [facility] MAIN OR;  Service: Oncology General    PR LAP,DIAGNOSTIC ABDOMEN N/A 10/04/2016    Procedure: LAPAROSCOPY DIAGNOSTIC;  Surgeon: Delano Metz, MD;  Location: [facility] MAIN OR;  Service: Oncology General    PR PART [place] PANC,PROX+REMV DUOD+ANAST N/A 03/29/2017    Procedure: WHIPPLE PROCEDURE;  Surgeon: Delano Metz, MD;  Location: [facility] MAIN OR;  Service: Oncology General    ROTATOR CUFF REPAIR Right     TONSILLECTOMY AND ADENOIDECTOMY       Family History   Problem Relation Age of Onset    Breast cancer Mother         died 54    Cancer Father         NHL, died 57    Heart Disease Father     Diabetes Sister     Obesity Sister         4 siblings     Social History     Social History    Marital status: Divorced     Spouse name: N/A    Number of children: N/A    Years of education: N/A     Social History Main Topics    Smoking status: Never Smoker    Smokeless tobacco: Never Used    Alcohol use No    Drug use: No    Sexual activity: Not Asked     Other Topics Concern    None     Social History Narrative    None       Allergies:  No Known Allergies (drug, envir, food or latex)    Medications:  Facility-Administered Medications Prior to Admission   Medication    sodium chloride 0.9 % IV    ondansetron (ZOFRAN) injection 4 mg     Prescriptions Prior to Admission   Medication Sig    Enteral Nutrition Supplies  (ENTERAL DELIVERY GRAVITY BAG) MISC 1.5 Cans by PEG Tube route daily (with dinner)    Syringe (B-D SYRINGE CATH TIP 60CC) 60 ML MISC Free water flushes to PEG    amoxicillin-clavulanate (AUGMENTIN) 875-125 MG per tablet Take 1 tablet by mouth 2 times daily for 30 days    tube feed formula (TWOCAL HN) 2 cal liquid 1 can TwoCal HN supplemental TF daily via G-tube two times per with 30 ml FWF before and after feed    rollator Use as directed.    nystatin (MYCOSTATIN) 100000 unit/mL suspension Take 5 mLs (500,000 Units total) by mouth 4 times daily   Swish and hold in mouth before swallowing.    Syringe, Disposable, (50-60CC SYRINGE) 60 ML MISC For free water flushes.    HYDROmorphone (DILAUDID) 4 MG tablet Take 1 tablet (4 mg total) by mouth every  4 hours as needed for Pain   Max daily dose: 24 mg    Nutritional Supplements (PROSOURCE NO CARB) LIQD liquid Take 30 mLs by mouth 2 times daily (with meals)    levothyroxine (SYNTHROID, LEVOTHROID) 150 MCG tablet Take 1 tablet (150 mcg total) by mouth daily (before breakfast)    metoclopramide (REGLAN) 10 MG tablet Take 1 tablet (10 mg total) by mouth 4 times daily (before meals and nightly)    DULoxetine (CYMBALTA) 20 MG DR capsule Take 1 capsule (20 mg total) by mouth every evening    lactobacillus rhamnosus, GG, (CULTURELLE) capsule Take 1 capsule (1 each total) by mouth daily    pregabalin (LYRICA) 75 MG capsule Take 1 capsule (75 mg total) by mouth 2 times daily   Max daily dose: 150 mg    sodium chloride 1 GM tablet 1 tablet (1 g total) by Per G Tube route 3 times daily (after meals)    LORazepam (ATIVAN) 0.5 MG tablet Take 0.5 tablets (0.25 mg total) by mouth nightly as needed for Anxiety (insomnia)   Max daily dose: 0.25 mg    ondansetron (ZOFRAN-ODT) 4 MG disintegrating tablet Take 1 tablet (4 mg total) by mouth 3 times daily as needed (nausea)   Place on top of tongue.    pantoprazole (PROTONIX) 40 MG EC tablet Take 1 tablet (40 mg total) by mouth  2 times daily (before meals)   Swallow whole. Do not crush, break, or chew.    ferrous sulfate 325 (65 FE) MG tablet Take 1 tablet (325 mg total) by mouth daily (with breakfast)    docusate sodium (COLACE) 100 MG capsule Take 2 capsules (200 mg total) by mouth 2 times daily    senna (SENOKOT) 8.6 MG tablet Take 2 tablets by mouth 2 times daily    PREMARIN 0.625 MG tablet Take 0.625 mg by mouth every morning       Vitamins - senior (CENTRUM SILVER) TABS Take 1 tablet by mouth daily (Patient taking differently: Take 1 tablet by mouth every morning   )      Current Facility-Administered Medications   Medication Dose Route Frequency    Dextrose 5 %- Sodium Chloride 0.9 %  125 mL/hr Intravenous Continuous    ondansetron (ZOFRAN) injection 4 mg  4 mg Intravenous Q6H PRN    amoxicillin-clavulanate (AUGMENTIN) 875-125 MG per tablet 1 tablet  1 tablet Oral BID    docusate sodium (COLACE) capsule 200 mg  200 mg Oral BID    DULoxetine (CYMBALTA) DR capsule 20 mg  20 mg Oral QPM    lactobacillus rhamnosus (GG) (CULTURELLE) capsule 1 each  1 capsule Oral Daily    ferrous sulfate tablet 325 mg  325 mg Oral Daily with breakfast    HYDROmorphone (DILAUDID) tablet 4 mg  4 mg Oral Q4H PRN    levothyroxine (SYNTHROID, LEVOTHROID) tablet 150 mcg  150 mcg Oral Daily    LORazepam (ATIVAN) tablet 0.25 mg  0.25 mg Oral QHS PRN    metoclopramide (REGLAN) tablet 10 mg  10 mg Oral 4x Daily AC & HS    PROSOURCE NO CARB liquid LIQD 30 mL  30 mL Oral BID WC    pantoprazole (PROTONIX) EC tablet 40 mg  40 mg Oral BID AC    methadone (DOLOPHINE) tablet 2.5 mg  2.5 mg Oral BID    enoxaparin (LOVENOX) injection 40 mg  40 mg Subcutaneous Daily @ 2100     Vitals:    09/19/17 1002  BP: 93/60   Pulse: 78   Resp: 16   Temp:    Weight: 62.1 kg (137 lb)   Height: 170.2 cm (5\' 7" )       ROS:  GI:see consult    Physical Examination:  Head/Nose/Throat:negative  Lungs:Lungs clear  Cardiovascular:normal S1 and S2  Abdomen: abdomen soft,  non-tender, nondistended, normal active bowel sounds, no masses or organomegaly      Lab Results: none    Radiology impressions (last 30 days):  Upper Gi  With Small Bowel Follow Through No Air (post Operative)    Result Date: 09/13/2017  09/13/2017 2:38 PM UPPER GI SERIES WITH SMALL BOWEL FOLLOW-THROUGH CLINICAL INFORMATION:   72 year old female with protracted hospital stay s/p WHipple 03/2017. Discharged 08/2017 with PEG.  Unable to tolerate oral or PEG feeds. + Nausea/vomiting bilious emesis.  Evaluate for stricture ; R11.2-Nausea with vomiting, unspecified COMPARISON:  Upper GI with KUB dated 06/21/2017 PROCEDURE: Spot and video fluoroscopic images of the esophagus, stomach and duodenum were performed after the patient ingested oral barium. AP radiographs of the stomach were performed at 30 minutes, 1.5 hours, 2 hours, 3.5 hours, and 4 hours. Compression of the abdomen and oblique views were obtained at 4 hours. This, overhead radiographs of the small bowel were obtained in various projections as barium traversed the small bowel. Fluoroscopic spot views were obtained. 6.1 minutes of fluoroscopy time. FINDINGS: Esophagus: Normal peristalsis and transit of contrast.  No intrinsic or extrinsic lesions are identified. GE junction: Normal in location and appearance. Stomach: Gastrostomy tube in place. Dilated appearance to the stomach ot approximately 10cm.. There is delayed gastric emptying. Contrast in the stomach did not proceed after 4 hours until oblique images were obtained. Unable to visualize the gastrointestinal anastomosis. Small bowel:  Normal caliber proximal small bowel is located in the left upper quadrant.  The remaining small bowel is normal in caliber and mucosal pattern.     1. Delayed transit of contrast in the stomach, indicating potentially function or mechanical obstruction of the gastrointestinal anastomosis. Recommend an endoscopy for further evaluation. END OF IMPRESSION I have personally  reviewed the images and the Resident's/Fellow's interpretation and agree with or edited the findings. [facility] submits this DICOM format image data and final report to the [facility], an independent secure electronic health information exchange, on a reciprocally searchable basis (with patient authorization) for a minimum of 12 months after exam date.      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 Updates to Patient's Condition (to be completed by a provider privileged to complete a H&P, following reassessment of the patient by the provider):    Full H&P done today; no updates needed.    II. Procedure Readiness   I have reviewed the patient's H&P and updated condition. By completing  and signing this form, I attest that this patient is ready for surgery/procedure.      III. Attestation   I have reviewed the updated information regarding the patient's condition and it is appropriate to proceed with the planned surgery/procedure.    Roanna Raider, MD as of 10:48 AM 09/19/2017

## 2017-09-20 ENCOUNTER — Inpatient Hospital Stay: Payer: Medicare (Managed Care)

## 2017-09-20 ENCOUNTER — Inpatient Hospital Stay: Payer: Medicare (Managed Care) | Admitting: Gastroenterology

## 2017-09-20 LAB — COMPREHENSIVE METABOLIC PANEL
ALT: 29 U/L (ref 0–35)
AST: 45 U/L — ABNORMAL HIGH (ref 0–35)
Albumin: 3 g/dL — ABNORMAL LOW (ref 3.5–5.2)
Alk Phos: 517 U/L — ABNORMAL HIGH (ref 35–105)
Anion Gap: 13 (ref 7–16)
Bilirubin,Total: 0.3 mg/dL (ref 0.0–1.2)
CO2: 20 mmol/L (ref 20–28)
Calcium: 8.2 mg/dL — ABNORMAL LOW (ref 8.6–10.2)
Chloride: 102 mmol/L (ref 96–108)
Creatinine: 0.56 mg/dL (ref 0.51–0.95)
GFR,Black: 108 *
GFR,Caucasian: 94 *
Glucose: 114 mg/dL — ABNORMAL HIGH (ref 60–99)
Lab: 8 mg/dL (ref 6–20)
Potassium: 3.8 mmol/L (ref 3.3–5.1)
Sodium: 135 mmol/L (ref 133–145)
Total Protein: 6.4 g/dL (ref 6.3–7.7)

## 2017-09-20 LAB — VENOUS BLOOD GAS
Base Excess,VENOUS: 0 mmol/L (ref <=2)
Bicarbonate,VENOUS: 25 mmol/L (ref 21–28)
CO2 (Calc),VENOUS: 26 mmol/L (ref 22–31)
CO: 1.1 %
FO2 HB,VENOUS: 59 % — ABNORMAL LOW (ref 63–83)
Hemoglobin: 8.8 g/dL — ABNORMAL LOW (ref 11.2–15.7)
Methemoglobin: 0.9 % (ref 0.0–1.0)
PCO2,VENOUS: 42 mm Hg (ref 40–50)
PH,VENOUS: 7.4 (ref 7.32–7.42)
PO2,VENOUS: 35 mm Hg (ref 25–43)

## 2017-09-20 LAB — MAGNESIUM: Magnesium: 1.6 mg/dL (ref 1.6–2.5)

## 2017-09-20 LAB — PREALBUMIN: Prealbumin: 11 mg/dL — ABNORMAL LOW (ref 20–40)

## 2017-09-20 LAB — POCT GLUCOSE: Glucose POCT: 116 mg/dL — ABNORMAL HIGH (ref 60–99)

## 2017-09-20 LAB — TRIGLYCERIDES: Triglycerides: 110 mg/dL

## 2017-09-20 LAB — PHOSPHORUS: Phosphorus: 3.2 mg/dL (ref 2.7–4.5)

## 2017-09-20 MED ORDER — METOCLOPRAMIDE HCL 5 MG/ML IJ SOLN *I*
10.0000 mg | Freq: Four times a day (QID) | INTRAMUSCULAR | Status: DC
Start: 2017-09-20 — End: 2017-09-24
  Administered 2017-09-20 – 2017-09-24 (×15): 10 mg via INTRAVENOUS
  Filled 2017-09-20 (×16): qty 2

## 2017-09-20 MED ORDER — POTASSIUM CHLORIDE 10 MEQ/50ML IV SOLN *I*
10.0000 meq | Freq: Once | INTRAVENOUS | Status: AC
Start: 2017-09-20 — End: 2017-09-20
  Administered 2017-09-20: 10 meq via INTRAVENOUS
  Filled 2017-09-20: qty 50

## 2017-09-20 MED ORDER — FENTANYL CITRATE 50 MCG/ML IJ SOLN *WRAPPED*
INTRAMUSCULAR | Status: AC | PRN
Start: 2017-09-20 — End: 2017-09-20
  Administered 2017-09-20: 25 ug via INTRAVENOUS
  Administered 2017-09-20: 50 ug via INTRAVENOUS

## 2017-09-20 MED ORDER — MIDAZOLAM HCL 5 MG/5ML IJ SOLN *I*
INTRAMUSCULAR | Status: AC | PRN
Start: 2017-09-20 — End: 2017-09-20
  Administered 2017-09-20 (×2): 2 mg via INTRAVENOUS

## 2017-09-20 MED ORDER — DEXTROSE 5 % FLUSH FOR PUMPS *I*
0.0000 mL/h | INTRAVENOUS | Status: DC | PRN
Start: 2017-09-20 — End: 2017-09-25

## 2017-09-20 MED ORDER — MIDAZOLAM HCL 5 MG/5ML IJ SOLN *I*
INTRAMUSCULAR | Status: AC
Start: 2017-09-20 — End: 2017-09-20
  Filled 2017-09-20: qty 10

## 2017-09-20 MED ORDER — MAGNESIUM SULFATE 2 GM IN 50 ML *WRAPPED*
2000.0000 mg | Freq: Once | INTRAVENOUS | Status: AC
Start: 2017-09-20 — End: 2017-09-20
  Administered 2017-09-20: 2000 mg via INTRAVENOUS
  Filled 2017-09-20: qty 50

## 2017-09-20 MED ORDER — LIDOCAINE HCL 1 % IJ SOLN *I*
1.0000 mL | Freq: Once | INTRAMUSCULAR | Status: AC | PRN
Start: 2017-09-20 — End: 2017-09-20
  Administered 2017-09-20: 1 mL via INTRADERMAL

## 2017-09-20 MED ORDER — SODIUM CHLORIDE 0.9 % INJ (FLUSH) WRAPPED *I*
10.0000 mL | Freq: Three times a day (TID) | Status: DC | PRN
Start: 2017-09-20 — End: 2017-09-25

## 2017-09-20 MED ORDER — FENTANYL CITRATE 50 MCG/ML IJ SOLN *WRAPPED*
INTRAMUSCULAR | Status: AC
Start: 2017-09-20 — End: 2017-09-20
  Filled 2017-09-20: qty 4

## 2017-09-20 MED ORDER — FAT EMULSION SOYBEAN OIL 20 % IV EMUL *WRAPPED*
56.0000 g | Freq: Every day | INTRAVENOUS | Status: AC
Start: 2017-09-20 — End: 2017-09-21
  Administered 2017-09-20: 56 g via INTRAVENOUS
  Filled 2017-09-20: qty 280

## 2017-09-20 MED ORDER — LACTATED RINGERS IV SOLN *I*
100.0000 mL/h | INTRAVENOUS | Status: DC
Start: 2017-09-20 — End: 2017-09-20
  Administered 2017-09-20: 100 mL/h via INTRAVENOUS

## 2017-09-20 MED ORDER — FOLIC ACID 5 MG/ML IJ SOLN *I*
1.0000 mg | Freq: Every day | INTRAMUSCULAR | Status: DC
Start: 2017-09-21 — End: 2017-09-25
  Administered 2017-09-21 – 2017-09-24 (×4): 1 mg via INTRAVENOUS
  Filled 2017-09-20 (×5): qty 0.2

## 2017-09-20 MED ORDER — MICONAZOLE NITRATE 2 % EX POWD *I*
Freq: Two times a day (BID) | CUTANEOUS | Status: DC
Start: 2017-09-20 — End: 2017-09-25
  Filled 2017-09-20: qty 43

## 2017-09-20 MED ORDER — SODIUM CHLORIDE 0.9 % FLUSH FOR PUMPS *I*
0.0000 mL/h | INTRAVENOUS | Status: DC | PRN
Start: 2017-09-20 — End: 2017-09-25
  Administered 2017-09-23 (×4): 10 mL/h
  Administered 2017-09-23: 10 mL/h via INTRAVENOUS
  Administered 2017-09-23 (×6): 10 mL/h
  Administered 2017-09-23: 100 mL/h
  Administered 2017-09-23 (×9): 10 mL/h
  Administered 2017-09-24: 10 mL/h via INTRAVENOUS
  Administered 2017-09-24 (×6): 10 mL/h

## 2017-09-20 MED ORDER — DIPHENHYDRAMINE HCL 50 MG/ML IJ SOLN *I*
25.0000 mg | Freq: Once | INTRAMUSCULAR | Status: AC
Start: 2017-09-20 — End: 2017-09-20
  Administered 2017-09-20: 25 mg via INTRAVENOUS
  Filled 2017-09-20: qty 1

## 2017-09-20 MED ORDER — THIAMINE HCL 100 MG/ML IJ SOLN *I*
100.0000 mg | Freq: Every day | INTRAMUSCULAR | Status: DC
Start: 2017-09-21 — End: 2017-09-24
  Administered 2017-09-21 – 2017-09-23 (×3): 100 mg via INTRAVENOUS
  Filled 2017-09-20 (×4): qty 2

## 2017-09-20 MED ORDER — STERILE WATER FOR INJECTION (TPN USE ONLY) *I*
INTRAVENOUS | Status: AC
Start: 2017-09-20 — End: 2017-09-21
  Filled 2017-09-20: qty 561.6

## 2017-09-20 MED ORDER — LORAZEPAM 2 MG/ML IJ SOLN *I*
0.5000 mg | Freq: Once | INTRAMUSCULAR | Status: AC
Start: 2017-09-20 — End: 2017-09-20
  Administered 2017-09-20: 0.5 mg via INTRAVENOUS
  Filled 2017-09-20: qty 1

## 2017-09-20 MED ORDER — SODIUM CHLORIDE 0.9 % INJ (FLUSH) WRAPPED *I*
10.0000 mL | Status: DC | PRN
Start: 2017-09-20 — End: 2017-09-25

## 2017-09-20 NOTE — Preop H&P (Addendum)
INPATIENT PRE-PROCEDURE H&P    Chief Complaint / Indications for Procedure: Evaluate for gastric outlet obstruction    Past Medical History:     Past Medical History:   Diagnosis Date    Acute kidney failure 03/31/2017    Cancer     Depression     Fibromyalgia     Hypothyroidism      Past Surgical History:   Procedure Laterality Date    APPENDECTOMY      CHOLECYSTECTOMY      CHOLECYSTECTOMY, LAPAROSCOPIC  09/06/2016    HYSTERECTOMY  08/1986    Fibroids    KNEE SURGERY Right     PR INSERT TUNNELED CV CATH WITH PORT Right 10/04/2016    Procedure: Right IJ MEDIPORT Insertion;  Surgeon: Delano Metz, MD;  Location: [facility] MAIN OR;  Service: Oncology General    PR LAP,DIAGNOSTIC ABDOMEN N/A 10/04/2016    Procedure: LAPAROSCOPY DIAGNOSTIC;  Surgeon: Delano Metz, MD;  Location: [facility] MAIN OR;  Service: Oncology General    PR PART [place] PANC,PROX+REMV DUOD+ANAST N/A 03/29/2017    Procedure: WHIPPLE PROCEDURE;  Surgeon: Delano Metz, MD;  Location: [facility] MAIN OR;  Service: Oncology General    ROTATOR CUFF REPAIR Right     TONSILLECTOMY AND ADENOIDECTOMY       Family History   Problem Relation Age of Onset    Breast cancer Mother         died 48    Cancer Father         NHL, died 35    Heart Disease Father     Diabetes Sister     Obesity Sister         4 siblings     Social History     Social History    Marital status: Divorced     Spouse name: N/A    Number of children: N/A    Years of education: N/A     Social History Main Topics    Smoking status: Never Smoker    Smokeless tobacco: Never Used    Alcohol use No    Drug use: No    Sexual activity: Not Asked     Other Topics Concern    None     Social History Narrative    None       Allergies:  No Known Allergies (drug, envir, food or latex)    Medications:  Current Facility-Administered Medications   Medication Dose Route Frequency    metoclopramide (REGLAN) injection 10 mg  10 mg Intravenous Q6H    levothyroxine (SYNTHROID) 20 MCG/ML injection 75 mcg  75 mcg  Intravenous Daily @ 0700    HYDROmorphone (DILAUDID) injection 0.5 mg  0.5 mg Intravenous Q2H PRN    LORazepam (ATIVAN) 2 mg/mL injection 0.5 mg  0.5 mg Intravenous QHS PRN    ampicillin (OMNIPEN) 500 mg in sodium chloride 0.9 % mini-bag PLUS  500 mg Intravenous 4 times per day    pantoprazole (PROTONIX) 4 mg/ml injection 40 mg  40 mg Intravenous Q24H    methadone (DOLOPHINE) 1.3 mg in sodium chloride 0.9 % IV syringe  1.3 mg Intravenous BID    Dextrose 5 %- Sodium Chloride 0.9 %  125 mL/hr Intravenous Continuous    ondansetron (ZOFRAN) injection 4 mg  4 mg Intravenous Q6H PRN    enoxaparin (LOVENOX) injection 40 mg  40 mg Subcutaneous Daily @ 2100      Vitals:    09/20/17 0435   BP: 96/60  Pulse: 73   Resp: 16   Temp: 36 C (96.8 F)   Weight:    Height:        ROS:  BP:ZWCHENID    Physical Examination:  Head/Nose/Throat:negative  Lungs:Lungs clear  Cardiovascular:normal S1 and S2  Abdomen: abdomen soft, non-tender, nondistended, normal active bowel sounds, no masses or organomegaly      Lab Results:   All labs in the last 24 hours   Recent Results (from the past 24 hour(s))   Comprehensive metabolic panel    Collection Time: 09/19/17 11:28 PM   Result Value Ref Range    Sodium 135 133 - 145 mmol/L    Potassium 3.8 3.3 - 5.1 mmol/L    Chloride 102 96 - 108 mmol/L    CO2 20 20 - 28 mmol/L    Anion Gap 13 7 - 16    UN 8 6 - 20 mg/dL    Creatinine 0.56 0.51 - 0.95 mg/dL    GFR,Caucasian 94 *    GFR,Black 108 *    Glucose 114 (H) 60 - 99 mg/dL    Calcium 8.2 (L) 8.6 - 10.2 mg/dL    Total Protein 6.4 6.3 - 7.7 g/dL    Albumin 3.0 (L) 3.5 - 5.2 g/dL    Bilirubin,Total 0.3 0.0 - 1.2 mg/dL    AST 45 (H) 0 - 35 U/L    ALT 29 0 - 35 U/L    Alk Phos 517 (H) 35 - 105 U/L   CBC    Collection Time: 09/19/17 11:28 PM   Result Value Ref Range    WBC 9.2 4.0 - 10.0 THOU/uL    RBC 2.6 (L) 3.9 - 5.2 MIL/uL    Hemoglobin 7.7 (L) 11.2 - 15.7 g/dL    Hematocrit 24 (L) 34 - 45 %    MCV 92 79 - 95 fL    MCH 29 26 - 32 pg/cell     MCHC 32 32 - 36 g/dL    RDW 16.8 (H) 11.7 - 14.4 %    Platelets 399 (H) 160 - 370 THOU/uL   Magnesium    Collection Time: 09/19/17 11:28 PM   Result Value Ref Range    Magnesium 1.6 1.6 - 2.5 mg/dL   Phosphorus    Collection Time: 09/19/17 11:28 PM   Result Value Ref Range    Phosphorus 3.2 2.7 - 4.5 mg/dL   Prealbumin    Collection Time: 09/19/17 11:28 PM   Result Value Ref Range    Prealbumin 11 (L) 20 - 40 mg/dL       Radiology impressions (last 30 days):  Upper Gi  With Small Bowel Follow Through No Air (post Operative)    Result Date: 09/13/2017  09/13/2017 2:38 PM UPPER GI SERIES WITH SMALL BOWEL FOLLOW-THROUGH CLINICAL INFORMATION:   72 year old female with protracted hospital stay s/p WHipple 03/2017. Discharged 08/2017 with PEG.  Unable to tolerate oral or PEG feeds. + Nausea/vomiting bilious emesis.  Evaluate for stricture ; R11.2-Nausea with vomiting, unspecified COMPARISON:  Upper GI with KUB dated 06/21/2017 PROCEDURE: Spot and video fluoroscopic images of the esophagus, stomach and duodenum were performed after the patient ingested oral barium. AP radiographs of the stomach were performed at 30 minutes, 1.5 hours, 2 hours, 3.5 hours, and 4 hours. Compression of the abdomen and oblique views were obtained at 4 hours. This, overhead radiographs of the small bowel were obtained in various projections as barium traversed the small bowel. Fluoroscopic spot  views were obtained. 6.1 minutes of fluoroscopy time. FINDINGS: Esophagus: Normal peristalsis and transit of contrast.  No intrinsic or extrinsic lesions are identified. GE junction: Normal in location and appearance. Stomach: Gastrostomy tube in place. Dilated appearance to the stomach ot approximately 10cm.. There is delayed gastric emptying. Contrast in the stomach did not proceed after 4 hours until oblique images were obtained. Unable to visualize the gastrointestinal anastomosis. Small bowel:  Normal caliber proximal small bowel is located in the left  upper quadrant.  The remaining small bowel is normal in caliber and mucosal pattern.     1. Delayed transit of contrast in the stomach, indicating potentially function or mechanical obstruction of the gastrointestinal anastomosis. Recommend an endoscopy for further evaluation. END OF IMPRESSION I have personally reviewed the images and the Resident's/Fellow's interpretation and agree with or edited the findings. [facility] submits this DICOM format image data and final report to the [facility], an independent secure electronic health information exchange, on a reciprocally searchable basis (with patient authorization) for a minimum of 12 months after exam date.      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 Updates to Patient's Condition (to be completed by a provider privileged to complete a H&P, following reassessment of the patient by the provider):    Full H&P done today; no updates needed.    II. Procedure Readiness   I have reviewed the patient's H&P and updated condition. By completing and signing this form, I attest that this patient is ready for surgery/procedure.      III. Attestation   I have reviewed the updated information regarding the patient's condition and it is appropriate to proceed with the planned surgery/procedure.    Syble Creek, MD as of 8:10 AM 09/20/2017     GI ATTENDING  I was present during the entire viewing portion of this endoscopy procedure, including from the  moment of scope insertion until the completion of the scope withdrawal.  I agree with the fellow's documentation of the procedure.  Dr. Talitha Givens

## 2017-09-20 NOTE — Procedures (Addendum)
EGD Procedure Note   Date of Procedure: 09/20/2017    Referring Physician: Charolotte Eke, MD   Primary Care Provider: Provider, None   Attending Physician: Roanna Raider, MD  Fellow: Syble Creek, MD  Indication(s): Evaluate for gastric outlet obstruction. Stomach full of barium and food on EGD 09/19/17    Medications: Fentanyl 75 mcg IV and Midazolam 4 mg IV were administered incrementally over the course of the procedure to achieve an adequate level of conscious sedation.  Moderate Sedation Face Times  Start Time: 2130  End Time: 1109  Duration (minutes): 18 Minutes    Assessment, ordering, supervision and monitoring of moderate sedation was performed by the endoscopy attending throughout the case from the first dose of sedation until patient left procedural area.  I was present throughout the entire moderate sedation.  Roanna Raider, MD    Endoscope: Fentanyl 75 mcg IV and Midazolam 4 mg IV  Accessories:None    Procedure Description: Full disclosure of risks were reviewed with patient as detailed on the consent form. The patient was placed in the left lateral decubitus position and monitored with continuous pulse oximetry, interval blood pressure monitoring and direct observations.   After adequate sedation, the endoscope was carefully introduced into the oropharynx and passed in to the esophagus under direct visualization. The esophagus and GE junction were carefully examined. After advancement of the endoscope into the stomach, a careful examination was performed, including views of the antrum, incisura angularis, corpus and retroflexed views of the cardia and fundus.   The pylorus was then intubated without any difficulty and the endoscope was advanced to the second portion of the duodenum. Careful examination of the second portion of the duodenum and the bulb was then performed.  The stomach was then decompressed and the endoscope withdrawn.   Findings and intervention(s) are detailed below. The  patient was recovered in the GI recovery area    Findings:     Esophagus:   Slightly irregular z line. Otherwise normal esophagus.       Stomach:   Large amounts of liquid and semi-solid food debris contaminated by barium seen throughout the stomach (image below). Unable to suction or clear the field. The PEG balloon was noted in the gastric pouch. Unable to intubate the efferent limb due to food matter and debris obscuring the view.                      Intervention(s):   None    Complication(s):   none    EBL: 0 ml    Impression(s):  1. Large amounts of retained liquids/barium and semi solid food debris despite NPO, PEG to LIWS and IV reglan since yesterday.    2. Unable to assess efferent limb; cannot determine if GOO is present vs dysmotility.     Recommendation(s):   NPO     Should receive parenteral nutrition   IV reglan 10 mg Q8 hours till Monday   Place NGT to LIWS   PEG tube to LIWS   Flush 30 cc of free water every 4 hours via PEG to dilute the barium for easier evacuation   We will repeat EGD on Monday with moderate sedation   IVF     Histopathologic Diagnosis:   none    Vijay Dalapathi, MD     GI ATTENDING  I was present during the entire viewing portion of this endoscopy procedure, including from the moment of scope insertion until the completion of the scope  withdrawal.  I agree with the fellow's documentation of the procedure.  Dr. Talitha Givens

## 2017-09-20 NOTE — Progress Notes (Signed)
Nutrition Progress Note      O:    Height: 170.2 cm (5\' 7" )  Weight: 62.1 kg (137 lb)     Weight Loss: 13.8% loss in 6 months        BMI (Calculated): 21.5  Energy Intake: <75% EEE x >30 days                 Registered Dietitian Assessment: Upon nutritional assessment, patient meets criteria for: severe protein calorie malnutrition related to chronic illness as evidenced by insufficient energy intake (<75% EEE x >30 days), severe unintentional wt loss (13.8% loss in 6 months).      Please refer to nutrition assessment dated 7/19 for detailed assessment/recommendation.     Present on Admission: Yes        Plan/Recommendations:  1. Please check and replete K+/Phos/Mg++ to WNL with IV replacements prior to starting TPN.   2. Continue to monitor K+/Phos/Mg++ daily as TPN is advanced to goal. Replete outside of TPN, as needed.  3. Once central access is obtained and electrolytes are WNL, recommend TPN (2 in 1) Adult Continuous plus Fat (central infusion site) with dextrose advanced to goal over 3 days:         Day 1:  Rate: 65 mL/hr                     Volume (Amino Acid/Dextrose solution): 1560 mL/day                                Amino acids: 54 g/L                                 Dextrose: 76 g/L                                 Standard additives except the following:                                                                                              Balance Chloride:Acetate                                      Insulin: 0 units/LITER                          Fat Emulsion 20% infusion: 56 gm/day (280 ml to be infused over 12 hours)              Day 2: If BGs/electrolytes are acceptable, advance dextrose to 130 g/L              Day 3: If BGs/electrolytes are acceptable, advance dextrose to 183 g/L (goal)  -TPN, at goal, will provide 1868 kcal/day, 84 g protein/day, 285 g dextrose/day and 1840 mL total fluid/day (including amino acid/dextrose solution and lipid volume).  4.   Please adjust or D/C IVF once TPN  is initiated (TPN will provide for baseline fluid needs). Continue to monitor fluid balance and provide non-dextrose containing IVF to replace any extraordinary losses.  5.   Please order TPN Monitoring Panel. Please check triglycerides.   6.   Recommend 100 mg thiamine and 1 mg folic acid daily x 7 days for refeeding risk       If central access is not obtained and Peripheral PN is desired, recommend TPN (2 in 1) Adult Continuous plus Fat (peripheral infusion site) with dextrose advanced over 2 days:               Day 1:  Rate: 65 mL/hr                           Volume (Amino Acid/Dextrose solution): 1560 mL/day                                      Amino acids: 45 g/L (maximum allowed in Peripheral PN)                                      Dextrose: 76 g/L                                       Standard additives                                                  Balance Chloride:Acetate                                      Insulin: 0 units/LITER                          Fat Emulsion 20% infusion: 56 gm/day (to be infused over 24 hrs)              Day 2: If BGs/electrolytes are acceptable, advance dextrose to 100 g/L     (maximum allowed in Peripheral PN)              -Peripheral PN, at goal, will provide 1371 kcal/day, 70 g protein/day, 156 g dextrose/day and 1840 mL total fluid/day (AA/Dextrose solution + lipid volume).     Will monitor nutrition related labs, weight trend and BM pattern. Will follow per high risk protocol.     Laurence Aly, RD  Pager [phone]      Attention to provider:   By co-signing this note, I agree with the diagnosis above.  Please document the malnutrition diagnosis in the patients progress note.

## 2017-09-20 NOTE — Progress Notes (Addendum)
[facility] Surgery Progress Note    Patient: Amanda Galloway    LOS: 2 days    Attending: Charolotte Eke, MD      INTERVAL HISTORY & SUBJECTIVE  Amanda Galloway is a 72 year old female with a pmh of whipple with prolonged hospital stay in 04/04/17 who presents for FTT and emesis.    Repeat EGD this morning. Patient resting well in her bed. Denies bm, but has been passing gas.          OBJECTIVE  Physical Exam:  Temp:  [36 C (96.8 F)-37 C (98.6 F)] 36 C (96.8 F)  Heart Rate:  [72-81] 73  Resp:  [16-20] 16  BP: (90-113)/(45-65) 96/60     GEN: no apparent distress  HEENT: NCAT, face symmetric  CHEST: nonlabored respirations  ABD: soft, nondistended, nontender. Midline incision healed  NEURO/MOTOR: alert, appropriate  EXTREMITIES: atraumatic  VASCULAR: feet wwp, no edema    Intake/Output:  07/18 0700 - 07/19 0659  In: 2824.6 [P.O.:120; I.V.:2498.6]  Out: 800 [Urine:650]     Medications:  Current Facility-Administered Medications   Medication    metoclopramide (REGLAN) injection 10 mg    levothyroxine (SYNTHROID) 20 MCG/ML injection 75 mcg    HYDROmorphone (DILAUDID) injection 0.5 mg    LORazepam (ATIVAN) 2 mg/mL injection 0.5 mg    ampicillin (OMNIPEN) 500 mg in sodium chloride 0.9 % mini-bag PLUS    pantoprazole (PROTONIX) 4 mg/ml injection 40 mg    methadone (DOLOPHINE) 1.3 mg in sodium chloride 0.9 % IV syringe    Dextrose 5 %- Sodium Chloride 0.9 %    ondansetron (ZOFRAN) injection 4 mg    enoxaparin (LOVENOX) injection 40 mg         Laboratory values:   Recent Labs      09/19/17   2328  09/18/17   2330   WBC  9.2  7.0   Hemoglobin  7.7*  7.2*   Hematocrit  24*  23*   Platelets  399*  425*   INR   --   1.2*     No components found with this basename: APTT, PT Recent Labs      09/19/17   2328  09/18/17   2330   Sodium  135  136   Potassium  3.8  3.8   Chloride  102  102   CO2  20  21   UN  8  13   Creatinine  0.56  0.63   Glucose  114*  126*   Calcium  8.2*  8.7   Magnesium  1.6  1.9   Phosphorus  3.2  3.8     Recent Labs      09/19/17   2328  09/18/17   2330   AST  45*  57*   ALT  29  37*   Alk Phos  517*  558*   Bilirubin,Total  0.3  0.3     Recent Labs      09/19/17   2328  09/18/17   2330   09/18/17   0956   Total Protein  6.4  7.2   < >  8.3*   Albumin  3.0*  3.3*   < >  4.0   Prealbumin  11*   --    --   15*    < > = values in this interval not displayed.     Recent Labs      09/18/17   1746   Amylase  95   Lipase  8*      GLUCOSE: No results for input(s): PGLU in the last 72 hours.    Imaging: No results found.     ASSESSMENT  Amanda Galloway is a 72 y.o. female with h/o of whipple and prolonged hospital stay who is here for FTT and inability to retain food.     PLAN   GI to take for EGD in AM, mechanical obstruction vs gastroparesis. Greatly appreciated   PEG tube to low intermittent suction per GI   Diet NPO time specified   Analgesia, antiemetics prn   Home meds switched to IV where possible  Dispo: [id number]  Author: Wilburn Cornelia, MD as of: 09/20/2017  at: 5:44 AM     [facility] Attending:    I saw and evaluated the patient. I have reviewed and edited the resident's/fellow's note and confirm the findings and plan of care as documented above. Stable exam. Will need to clear stomach for EGD.    Charolotte Eke, MD

## 2017-09-20 NOTE — Consults (Signed)
Medical Nutrition Therapy - Initial Assessment    Admit Date: 09/18/2017    Reason for consult: TPN recommendations     Patient Summary: 72 y.o. female with h/o of whipple and prolonged hospital stay who is here for FTT    Past Medical History:   Diagnosis Date    Acute kidney failure 03/31/2017    Cancer     Depression     Fibromyalgia     Hypothyroidism      Past Surgical History:   Procedure Laterality Date    APPENDECTOMY      CHOLECYSTECTOMY      CHOLECYSTECTOMY, LAPAROSCOPIC  09/06/2016    HYSTERECTOMY  08/1986    Fibroids    KNEE SURGERY Right     PR INSERT TUNNELED CV CATH WITH PORT Right 10/04/2016    Procedure: Right IJ MEDIPORT Insertion;  Surgeon: Delano Metz, MD;  Location: [facility] MAIN OR;  Service: Oncology General    PR LAP,DIAGNOSTIC ABDOMEN N/A 10/04/2016    Procedure: LAPAROSCOPY DIAGNOSTIC;  Surgeon: Delano Metz, MD;  Location: [facility] MAIN OR;  Service: Oncology General    PR PART [place] PANC,PROX+REMV DUOD+ANAST N/A 03/29/2017    Procedure: WHIPPLE PROCEDURE;  Surgeon: Delano Metz, MD;  Location: [facility] MAIN OR;  Service: Oncology General    ROTATOR CUFF REPAIR Right     TONSILLECTOMY AND ADENOIDECTOMY          Pertinent Social Hx: divorced, non-smoker     Pertinent Meds: D5 NS @ 125 ml/hr, levothyroxine, ampicillin, Protonix, methadone, LR @ 100 ml/hr, Reglan, KCl  Pertinent Labs: Ca 8.2, other lytes WNL, t bili WNL    Reviewed I/O's: 700 ml UOP thus far today     Enteral or parenteral access: PEG (currently to suction)     Nutrition Hx: Pt reports poor po tolerance and emesis every day since discharge. She states it has been a long time since she had her TFs (weeks) and has not had any food in 3 days and was therefore discouraged that there was still food in her stomach. She reports frustration with her current medical status and was looking forward to her son and grandson being in town, they were going to take her to see [employer].     Food allergies: NKFA    Current diet: NPO  Supplements: none      Nutrition Focused Physical Exam:  Edema: not noted   Abdomen: reddened, nausea, hypoactive BS, +flatus   Skin: no open areas noted   Body fat stores: deferred at this time   Lean body mass stores: deferred at this time     Anthropometrics:  Height: 170.2 cm (5\' 7" )    Current Weight: 62.1 kg (137 lb); 86% IBW  Ideal Body Weight: 72.4 kg + 10% adjusted for age   BMI: 21.4 kg/(m^2) underweight for age   Weight Hx: 13.8% loss in 6 months     09/19/2017 62.143 kg 137 lbs Stated   03/29/2017 72 kg 158 lbs 12 oz Actual         Estimated Nutrient Needs: (Based on 62.1 kg)    1550-1870 kcal/day (25-30 kcal/kg)   75-93 g protein/day (1.2-1.5 g/kg)    1550-1870 mL fluid/day (25-30 mL/kg)      Nutrition Assessment and Diagnosis:       Familiar to writer from previous hospitalization, had required TPN and TFs. Discharged after long hospitalization 08/2017, since then has had persistent vomiting. Had been d/c'd on TwoCal, was taking some  po. She had been eating soft foods and had tried to avoid eating 5 hours before bed. EGD shows large amounts of retained liquids/barium and semi solid food debris despite NPO, PEG to LIWS and IV reglan since yesterday. Could not determine if GOO or dysmotility. Writer explained TPN to patient and explained that it will help her get the nutrients she needs while bypassing the stomach.     TPN Initiation  The dietitian and Clinical Nutrition Specialist agrees TPN is appropriate for this patient.  Indicated for: food retention in stomach (? GOO vs dysmotility)    Original Start Date: 7/19  D/C Plan: TPN will be discontinued once pt tolerating po diet/enteral feeds and passing through stomach        Malnutrition Status: Pt with evidence of severe protein calorie malnutrition related to chronic illness as evidenced by insufficient energy intake (<75% EEE x >30 days), severe unintentional wt loss (13.8% loss in 6 months).       Nutrition Intervention:   Please check and replete K+/Phos/Mg++ to WNL  with IV replacements prior to starting TPN.   Continue to monitor K+/Phos/Mg++ daily as TPN is advanced to goal. Replete outside of TPN, as needed.  Once central access is obtained and electrolytes are WNL, recommend TPN (2 in 1) Adult Continuous plus Fat (central infusion site) with dextrose advanced to goal over 3 days:    Day 1: Rate: 65 mL/hr     Volume (Amino Acid/Dextrose solution): 1560 mL/day     Amino acids: 54 g/L      Dextrose: 76 g/L      Standard additives except the following:            Balance Chloride:Acetate     Insulin: 0 units/LITER    Fat Emulsion 20% infusion: 56 gm/day (280 ml to be infused over 12 hours)   Day 2: If BGs/electrolytes are acceptable, advance dextrose to 130 g/L   Day 3: If BGs/electrolytes are acceptable, advance dextrose to 183 g/L (goal)  -TPN, at goal, will provide 1868 kcal/day, 84 g protein/day, 285 g dextrose/day and 1840 mL total fluid/day (including amino acid/dextrose solution and lipid volume).   4.   Please adjust or D/C IVF once TPN is initiated (TPN will provide for baseline fluid needs). Continue to monitor fluid balance and provide non-dextrose containing IVF to replace any extraordinary losses.  5.   Please order TPN Monitoring Panel. Please check triglycerides.   6.   Recommend 100 mg thiamine and 1 mg folic acid daily x 7 days for refeeding risk       If central access is not obtained and Peripheral PN is desired, recommend TPN (2 in 1) Adult Continuous plus Fat (peripheral infusion site) with dextrose advanced over 2 days:    Day 1: Rate: 65 mL/hr     Volume (Amino Acid/Dextrose solution): 1560 mL/day     Amino acids: 45 g/L (maximum allowed in Peripheral PN)     Dextrose: 76 g/L      Standard additives      Balance Chloride:Acetate     Insulin: 0 units/LITER    Fat Emulsion 20% infusion: 56 gm/day (to be infused over 24 hrs)   Day 2: If BGs/electrolytes are acceptable, advance dextrose to 100 g/L  (maximum allowed in Peripheral PN)   -Peripheral PN, at goal,  will provide 1371 kcal/day, 70 g protein/day, 156 g dextrose/day and 1840 mL total fluid/day (AA/Dextrose solution + lipid volume).     Nutrition  Monitoring/Evaluation:     1. Will monitor TF tolerance, nutrition-related labs, weight trend, BM pattern.  2. Nutrition to follow up per high nutrition risk protocol.    Laurence Aly, [place]  Pager [phone]

## 2017-09-20 NOTE — Progress Notes (Signed)
Report Given    Trish Fountain, RN Received Handoff Report from Erich Montane, bedside LPN for PICC procedure 10:40 AM        Procedure Summary  [employer] Nursing Procedure Note    Jeanene Mena  [id number]    PICC Line        Status: Completed    Patient tolerated procedure well     Specimen Collection: no    Sponge count: N/A    Fluid Removed:No  Procedure Dressing Site located:Right Arm  Dressing Type:Occlusive  Biopatch:yes  Dressing status:Clean, dry and intact   Hematoma:Not evident  Medication received:Lido 1%   Cardiovascular:   Peripheral Pulses: Present post procedure      Fistula: N/A    Neuro Assessment:Patient is at pre-procedure baseline    Implant patient information given to patient or parent/guardian:Yes    Report given to:Unit Nurse. Unit [facility] Name Erich Montane, bedside LPN       Last Filed Vitals    09/20/17 1035   BP: 102/53   Pulse: 71   Resp: 16   Temp:    SpO2: 100%

## 2017-09-20 NOTE — Progress Notes (Signed)
Off floor this morning via stretcher to GI, arrived back to floor this afternoon, denies pain. PICC team and provider notified notified pt arrived back to floor. Ambulate around unit with writer times two laps- tolerated well. Out of bed to chair at this time, voiding without difficulty, denies nausea this shift. Peg tube to LIWS flushed per order, will continue to monitor.

## 2017-09-20 NOTE — Procedures (Signed)
PICC PROCEDURE NOTE    Lot #:                 [id number]                          Size:5 French Double Lumen PICC  Side:right    Time out documentation completed prior to procedure:  Yes    Indications  Nutrition/TPN  Recommendations : PICC Line       Procedure Details     Procedure Date :09/20/2017    Ultrasound used:yes  Modified Seldinger technique used.    Catheter exchange: no  Vein Accessed: basilic  Venipuncture attempts & sites: 1   Guide Wire advancement: easy  Catheter Advancement: easy  Switched arms: no  Procedure: successful  Guide Wire Removed : yes  Refer to Radiology :No    Findings    Catheter inserted to 36 cm, with 2 cm exposed.  Mid upper arm circumference is 27 cm, measured at 15 cm sup/ac.    Complications  none    Condition    Pre-Procedure PICC Site Pain Assessment: Scale used: Numeric Score: 0 Time: 1220   Post-Procedure PICC Site Pain Assessment: Scale used: NumericScore: 0 Time 1250  Comfort Measures Provided : Pain Medication as Ordered by Provider     Plan/Orders    PICC tip verified by ECG Tip Confirmation System: Yes  ECG rhythm strip placed in chart and uploaded to patient [place]  VBG WNL: Yes, VBG 35  STAT Portable Chest X-Ray Ordered :N/A    Other :   Floor Nurse Erich Montane, LPN aware that OK to Use PICC order must be placed prior to use of PICC.    EBL  Estimated Blood Loss : Minimal    Disposition  Pt tolerated PICC placement well    Trish Fountain, RN  09/20/2017  1:30 PM

## 2017-09-20 NOTE — Consults (Signed)
PICC PRE-PROCEDURE ASSESSMENT     Patient Vascular History/Contraindications: No Known Problems    Nephrology note required for PICC placement and provider contacted: No    Labs Reviewed:      Lab results: 09/19/17  2328   GFR,Caucasian 94   GFR,Black 108     No components found for: CREATININE      Lab results: 09/19/17  2328   WBC 9.2           Lab results: 09/18/17  2330   INR 1.2*           Lab results: 09/19/17  2328   Platelets 399*            Extremity Assessment:  WNL    Recommendations: PICC Line    Refer to Radiology:   No

## 2017-09-21 LAB — POCT GLUCOSE
Glucose POCT: 112 mg/dL — ABNORMAL HIGH (ref 60–99)
Glucose POCT: 121 mg/dL — ABNORMAL HIGH (ref 60–99)
Glucose POCT: 125 mg/dL — ABNORMAL HIGH (ref 60–99)
Glucose POCT: 130 mg/dL — ABNORMAL HIGH (ref 60–99)
Glucose POCT: 77 mg/dL (ref 60–99)

## 2017-09-21 LAB — CBC
Hematocrit: 23 % — ABNORMAL LOW (ref 34–45)
Hemoglobin: 7.2 g/dL — ABNORMAL LOW (ref 11.2–15.7)
MCH: 28 pg/cell (ref 26–32)
MCHC: 31 g/dL — ABNORMAL LOW (ref 32–36)
MCV: 92 fL (ref 79–95)
Platelets: 369 10*3/uL (ref 160–370)
RBC: 2.6 MIL/uL — ABNORMAL LOW (ref 3.9–5.2)
RDW: 16.4 % — ABNORMAL HIGH (ref 11.7–14.4)
WBC: 7.4 10*3/uL (ref 4.0–10.0)

## 2017-09-21 LAB — COMPREHENSIVE METABOLIC PANEL
ALT: 31 U/L (ref 0–35)
AST: 43 U/L — ABNORMAL HIGH (ref 0–35)
Albumin: 3 g/dL — ABNORMAL LOW (ref 3.5–5.2)
Alk Phos: 511 U/L — ABNORMAL HIGH (ref 35–105)
Anion Gap: 12 (ref 7–16)
Bilirubin,Total: 0.2 mg/dL (ref 0.0–1.2)
CO2: 23 mmol/L (ref 20–28)
Calcium: 8.2 mg/dL — ABNORMAL LOW (ref 8.6–10.2)
Chloride: 99 mmol/L (ref 96–108)
Creatinine: 0.56 mg/dL (ref 0.51–0.95)
GFR,Black: 108 *
GFR,Caucasian: 94 *
Glucose: 117 mg/dL — ABNORMAL HIGH (ref 60–99)
Lab: 6 mg/dL (ref 6–20)
Potassium: 3.7 mmol/L (ref 3.3–5.1)
Sodium: 134 mmol/L (ref 133–145)
Total Protein: 6.4 g/dL (ref 6.3–7.7)

## 2017-09-21 LAB — MAGNESIUM: Magnesium: 1.9 mg/dL (ref 1.6–2.5)

## 2017-09-21 LAB — PHOSPHORUS: Phosphorus: 3.6 mg/dL (ref 2.7–4.5)

## 2017-09-21 MED ORDER — FAT EMULSION SOYBEAN OIL 20 % IV EMUL *WRAPPED*
56.0000 g | Freq: Every day | INTRAVENOUS | Status: AC
Start: 2017-09-21 — End: 2017-09-22
  Administered 2017-09-21: 56 g via INTRAVENOUS
  Filled 2017-09-21: qty 280

## 2017-09-21 MED ORDER — STERILE WATER FOR INJECTION (TPN USE ONLY) *I*
INTRAVENOUS | Status: AC
Start: 2017-09-21 — End: 2017-09-22
  Filled 2017-09-21: qty 561.6

## 2017-09-21 MED ORDER — DIPHENHYDRAMINE HCL 50 MG/ML IJ SOLN *I*
25.0000 mg | Freq: Once | INTRAMUSCULAR | Status: AC
Start: 2017-09-21 — End: 2017-09-21
  Administered 2017-09-21: 25 mg via INTRAVENOUS
  Filled 2017-09-21: qty 1

## 2017-09-21 NOTE — Progress Notes (Signed)
Amanda Galloway demonstrates adequate mobility to return to her prior living arrangement. Will continue to follow in PT to progress higher level balance and strength. Discharge Recommendations: Anticipate return to prior living arrangement, Home PT after discharge from the hospital.     PT Discharge Equipment Recommended: None     Physical Therapy Initial Evaluation:     History of Present Admission: Per MD notes, Amanda Galloway is a 72 y.o. female with h/o of whipple and prolonged hospital stay who is here for FTT and inability to retain food.     Past Medical History:   Diagnosis Date    Acute kidney failure 03/31/2017    Cancer     Depression     Fibromyalgia     Hypothyroidism         Past Surgical History:   Procedure Laterality Date    APPENDECTOMY      CHOLECYSTECTOMY      CHOLECYSTECTOMY, LAPAROSCOPIC  09/06/2016    HYSTERECTOMY  08/1986    Fibroids    KNEE SURGERY Right     PR INSERT TUNNELED CV CATH WITH PORT Right 10/04/2016    Procedure: Right IJ MEDIPORT Insertion;  Surgeon: Amanda Metz, MD;  Location: [facility] MAIN OR;  Service: Oncology General    PR LAP,DIAGNOSTIC ABDOMEN N/A 10/04/2016    Procedure: LAPAROSCOPY DIAGNOSTIC;  Surgeon: Amanda Metz, MD;  Location: [facility] MAIN OR;  Service: Oncology General    PR PART [place] PANC,PROX+REMV DUOD+ANAST N/A 03/29/2017    Procedure: WHIPPLE PROCEDURE;  Surgeon: Amanda Metz, MD;  Location: [facility] MAIN OR;  Service: Oncology General    ROTATOR CUFF REPAIR Right     TONSILLECTOMY AND ADENOIDECTOMY         Personal factors affecting treatment/recovery:   History of falls   Recent hospital admission    Comorbidities affecting treatment/recovery:   Cancer (chemotherapy/radiation therapy)   Depression   Fibromyalgia    Clinical presentation:   stable    Patient complexity:     low level as indicated by above stability of condition, personal factors, environmental factors and comorbidities in addition to their impairments found on physical exam.     09/21/17 0918    Prior Living    Prior Living Situation Reported by patient   Lives With Family   Type of Home 2 Story home   # Steps to [place] 2   # Rails to [place] 1   # Of Steps In Home 10   # Rails in Home 1  (3 steps with no railing)   Location of Bedrooms 2nd floor   Location of Bathrooms 1st floor;2nd floor   Medical Equipment in Home None   Prior Function Level   Prior Function Level Reported by patient   Transfers Independent   Walking Independent   Walking assistive devices used None   Stair negotiation Independent   PT Tracking   PT [place]   Visit Number   Visit Number [facility]) / Treatment Day ([place]) 1   Visit Details [facility])   Visit Type [facility]) Evaluation   Reason for visit [facility]) General   Precautions/Observations   Precautions used Yes   LDA Observation IV lines;Feeding tube  (PEG to suction, RN disconnected for PT)   Fall Precautions General falls precautions   Other Pt up in recliner chair, states things going well at home prior to not feeling well.   Pain Assessment   *Is the patient currently in pain? Denies   Vision  Current Vision Wears corrective lenses   Cognition   Cognition Tested   Arousal/Alertness Appropriate responses to stimuli   Orientation A&Ox4   Following Commands Follows all commands and directions without difficulty   UE Assessment   UE Assessment Full range RUE AROM;Full range LUE AROM   LUE Strength   Overall Strength WFL able to perform ADL tasks with strength   RUE Strength   Overall Strength WFL able to perform ADL tasks with strength   LE Assessment   LE Assessment Full AROM RLE;Full AROM LLE   Strength LLE   Overall Strength WFL able to perform ADL tasks with strength   Strength RLE   Overall Strength WFL able to perform ADL tasks with strength   Sensation   Sensation No apparent deficit   Bed Mobility   Bed mobility Tested   Supine to Sit Independent;Head of bed flat;Side rails down   Sit to Supine Independent;Head of bed flat;Side rails down   Additional comments Pt able  to complete without difficulty   Transfers   Transfers Tested   Sit to Stand Independent   Stand to sit Independent   Transfer Assistive Device none   Additional comments Pt up from toilet, recliner, and edge of bed with minimal use of BUE to assist and good initial standing balance.   Mobility   Mobility Tested   Gait Pattern WFL   Ambulation Assist Independent   Ambulation Distance (Feet) 479ft   Ambulation Assistive Device None   Stairs Assistance Modified independent (device)   Stair Management Technique One rail;Alternating pattern;Step to pattern;Forwards   Number of Stairs 16   Additional comments Pt ambulated WFL gait speed and overall steady pattern with no overt loss of balance. Pt able to ascend/descend stairs with no overt loss of balance, using railing/wall for support when needed.   Therapeutic Exercises   Additional comments Educated on importance of daily ambulation to promote mobility.   Balance   Balance Tested   Sitting - Static Independent ;Unsupported   Standing - Static Independent;Unsupported   Standing - Dynamic Independent;Unsupported   Additional Comments For higher level balance pt required SBA to CGA   PT AM-PAC Mobility   Turning over in bed? 4   Sitting down on and standing up from a chair with arms? 4   Moving from lying on back to sitting on the side of the bed? 4   Moving to and from a bed to a chair? 4   Need to walk in hospital room? 4   Climbing 3 - 5 steps with a railing? 4   Total Raw Score 24   Standardized Score 61.14   CMS 1-100% Score 0   Assessment   Brief Assessment Patient demonstrates adequate mobility skills to return home;Appropriate for skilled therapy   Problem List Impaired balance;Impaired LE strength   Patient / Family Goal home to see son and grandkids   Additional Comments Pt demonstrates adequate mobility to return home. If remains in hospital will continue to follow for higher level balance and BLE strengthening.   Plan/Recommendation   Treatment Interventions  Restorative PT;AROM;Transfers training;Balance training;Stair training;Pt/Family education;Strengthening;Gait training;Home exercise program instruction;D/C planning   PT Frequency 1-2x/wk   Mobility Recommendations independent after set-up. Please encourage 3 walks a day   Referral Recommendations Home care   Discharge Recommendations Anticipate return to prior living arrangement;Home PT   PT Discharge Equipment Recommended None   Assessment/Recommendations Reviewed With: Patient;Care coordinator   Next PT Visit higher  level balance, strengthening   Time Calculation   PT Timed Codes 0   PT Untimed Codes 24   PT Unbilled Time 0   PT Total Treatment 24   Plan and Onset date   Plan of Care Date 09/21/17   Onset Date 09/19/17   Treatment Start Date 09/21/17     Rodena Piety, PT, DPT  Pager [phone]

## 2017-09-21 NOTE — Student Note (Signed)
[facility] Surgery Progress Note    Patient: Amanda Galloway    LOS: 3 days    Attending: Charolotte Eke, MD      INTERVAL HISTORY & SUBJECTIVE  NAE overnight. Feeling the same today, low mood and fatigue. NPO, Pain is well-controlled, Voiding spontaneously, +BM/flatus, +OOB. Denies nausea/vomiting, diarrhea, chest pain, shortness of breath, numbness/tingling. EGD scheduled for Monday.     OBJECTIVE  Physical Exam:  Vitals:    09/20/17 1650 09/20/17 1945 09/21/17 0130 09/21/17 0403   BP: 98/58 100/52 100/50 100/50   BP Location: Left arm Left arm Left arm Left arm   Pulse: 83 82 81 82   Resp: _0 Temp: 37.2 C (99 F) 36.9 C (98.4 F) 36.3 C (97.3 F) 36.2 C (97.2 F)   TempSrc: Temporal Temporal Temporal Temporal   SpO2: 99% 98% 97% 96%   Weight:       Height:           GEN: no apparent distress  HEENT: NCAT, face symmetric  CHEST: regular rate and rhythm, no m/r/g  LUNGS: Clear to auscultation bilaterally in all fields, nonlabored respirations  ABD: soft, nondistended, nontender, G tube in place, dressing is clean and dry  NEURO/MOTOR: alert, appropriate  EXTREMITIES: atraumatic  VASCULAR: feet wwp, no edema    Intake/Output:  07/19 0700 - 07/20 0659  In: 3005 [I.V.:949.7]  Out: 2475 [Urine:1450]     Medications:  Current Facility-Administered Medications   Medication    sodium chloride 0.9 % FLUSH REQUIRED IF PATIENT HAS IV    dextrose 5 % FLUSH REQUIRED IF PATIENT HAS IV    metoclopramide (REGLAN) injection 10 mg    sodium chloride 0.9 % flush 10 mL    sodium chloride 0.9 % flush 10 mL    miconazole (MICATIN) 2 % powder    TPN (2 in 1) Adult Continuous    thiamine (B-1) injection 025 mg    folic acid injection 1 mg    levothyroxine (SYNTHROID) 20 MCG/ML injection 75 mcg    HYDROmorphone (DILAUDID) injection 0.5 mg    LORazepam (ATIVAN) 2 mg/mL injection 0.5 mg    ampicillin (OMNIPEN) 500 mg in sodium chloride 0.9 % mini-bag PLUS    pantoprazole (PROTONIX) 4 mg/ml injection 40 mg     methadone (DOLOPHINE) 1.3 mg in sodium chloride 0.9 % IV syringe    ondansetron (ZOFRAN) injection 4 mg    enoxaparin (LOVENOX) injection 40 mg         Laboratory values:   Recent Labs      09/21/17   0041  09/20/17   1309  09/19/17   2328  09/18/17   2330   WBC  7.4   --   9.2  7.0   Hemoglobin  7.2*  8.8*  7.7*  7.2*   Hematocrit  23*   --   24*  23*   Platelets  369   --   399*  425*   INR   --    --    --   1.2*     No components found with this basename: APTT, PT Recent Labs      09/21/17   0041  09/19/17   2328   Sodium  134  135   Potassium  3.7  3.8   Chloride  99  102   CO2  23  20   UN  6  8   Creatinine  0.56  0.56   Glucose  117*  114*   Calcium  8.2*  8.2*   Magnesium  1.9  1.6   Phosphorus  3.6  3.2    Recent Labs      09/21/17   0041  09/19/17   2328   AST  43*  45*   ALT  31  29   Alk Phos  511*  517*   Bilirubin,Total  0.2  0.3     Recent Labs      09/21/17   0041  09/19/17   2328   09/18/17   0956   Total Protein  6.4  6.4   < >  8.3*   Albumin  3.0*  3.0*   < >  4.0   Prealbumin   --   11*   --   15*    < > = values in this interval not displayed.     Recent Labs      09/18/17   1746   Amylase  95   Lipase  8*      GLUCOSE:   Recent Labs      09/21/17   0608  09/21/17   0036  09/20/17   1717   Glucose POCT  125*  77  116*       Imaging: No results found.     ASSESSMENT  Amanda Galloway is a 72 y.o. female with h/o whipple procedure with complicated/prolonged hospital stay who is Hospital Day #3 for failure to thrive due to chronic nausea/vomiting, poor PO intake with suspected gastric outlet obstruction vs gastroparesis.     PLAN   GI to do EGD on Monday, much appreciated   No need for NG tube at this time   G tube to wall suction with prn saline flushing   Diet NPO time specified   TPN (2 in 1) Adult Continuous    Bowel regimen: none   Analgesia: dilaudid 0.29m q2prn, methadone 1.33mBID   Antiemetics prn: zofran 83m1m Home meds: synthroid 34m36mvery day, lorazepam 0.5mg 73ms   OOB/ambulate   DVT ppx: lovenox   Dispo: pending results of EGD monday    Author: JasmiJerilynn Birkenheadf: 09/21/2017  at: 8:38 AM

## 2017-09-21 NOTE — Plan of Care (Signed)
Problem: Decreased Caregiver Knowledge  Goal: STG - IMPROVE CAREGIVER KNOWLEDGE  Patient will be independent with Home exercise program    Timeframe: 7-10 days

## 2017-09-21 NOTE — Progress Notes (Addendum)
[facility] Surgery Progress Note    Patient: Amanda Galloway    LOS: 3 days    Attending: Charolotte Eke, MD      INTERVAL HISTORY & SUBJECTIVE  NAE overnight. Feeling the same today, low mood and fatigue. NPO, Pain is well-controlled, Voiding spontaneously, +BM/flatus, +OOB. Denies nausea/vomiting, diarrhea, chest pain, shortness of breath, numbness/tingling. EGD scheduled for Monday.     OBJECTIVE  Physical Exam:  Vitals:    09/20/17 1650 09/20/17 1945 09/21/17 0130 09/21/17 0403   BP: 98/58 100/52 100/50 100/50   BP Location: Left arm Left arm Left arm Left arm   Pulse: 83 82 81 82   Resp: _0 Temp: 37.2 C (99 F) 36.9 C (98.4 F) 36.3 C (97.3 F) 36.2 C (97.2 F)   TempSrc: Temporal Temporal Temporal Temporal   SpO2: 99% 98% 97% 96%   Weight:       Height:           GEN: no apparent distress  HEENT: NCAT, face symmetric  CHEST: regular rate and rhythm, no m/r/g  LUNGS: Clear to auscultation bilaterally in all fields, nonlabored respirations  ABD: soft, nondistended, nontender, G tube in place, dressing is clean and dry  NEURO/MOTOR: alert, appropriate  EXTREMITIES: atraumatic  VASCULAR: feet wwp, no edema    Intake/Output:  07/19 0700 - 07/20 0659  In: 3005 [I.V.:949.7]  Out: 2475 [Urine:1450]     Medications:  Current Facility-Administered Medications   Medication    sodium chloride 0.9 % FLUSH REQUIRED IF PATIENT HAS IV    dextrose 5 % FLUSH REQUIRED IF PATIENT HAS IV    metoclopramide (REGLAN) injection 10 mg    sodium chloride 0.9 % flush 10 mL    sodium chloride 0.9 % flush 10 mL    miconazole (MICATIN) 2 % powder    TPN (2 in 1) Adult Continuous    thiamine (B-1) injection 025 mg    folic acid injection 1 mg    levothyroxine (SYNTHROID) 20 MCG/ML injection 75 mcg    HYDROmorphone (DILAUDID) injection 0.5 mg    LORazepam (ATIVAN) 2 mg/mL injection 0.5 mg    ampicillin (OMNIPEN) 500 mg in sodium chloride 0.9 % mini-bag PLUS    pantoprazole (PROTONIX) 4 mg/ml injection 40 mg     methadone (DOLOPHINE) 1.3 mg in sodium chloride 0.9 % IV syringe    ondansetron (ZOFRAN) injection 4 mg    enoxaparin (LOVENOX) injection 40 mg         Laboratory values:   Recent Labs      09/21/17   0041  09/20/17   1309  09/19/17   2328  09/18/17   2330   WBC  7.4   --   9.2  7.0   Hemoglobin  7.2*  8.8*  7.7*  7.2*   Hematocrit  23*   --   24*  23*   Platelets  369   --   399*  425*   INR   --    --    --   1.2*     No components found with this basename: APTT, PT Recent Labs      09/21/17   0041  09/19/17   2328   Sodium  134  135   Potassium  3.7  3.8   Chloride  99  102   CO2  23  20   UN  6  8   Creatinine  Glucose  117*  114*   Calcium  8.2*  8.2*   Magnesium  1.9  1.6   Phosphorus  3.6  3.2    Recent Labs      09/21/17   0041  09/19/17   2328   AST  43*  45*   ALT  31  29   Alk Phos  511*  517*   Bilirubin,Total  0.2  0.3     Recent Labs      09/21/17   0041  09/19/17   2328   09/18/17   0956   Total Protein  6.4  6.4   < >  8.3*   Albumin  3.0*  3.0*   < >  4.0   Prealbumin   --   11*   --   15*    < > = values in this interval not displayed.     Recent Labs      09/18/17   1746   Amylase  95   Lipase  8*      GLUCOSE:   Recent Labs      09/21/17   0608  09/21/17   0036  09/20/17   1717   Glucose POCT  125*  77  116*       Imaging: No results found.     ASSESSMENT  Amanda Galloway is a 72 y.o. female with h/o whipple procedure with complicated/prolonged hospital stay who is Hospital Day #3 for failure to thrive due to chronic nausea/vomiting, poor PO intake with suspected gastric outlet obstruction vs gastroparesis.     PLAN   GI to do EGD on Monday, much appreciated   No need for NG tube at this time   G tube to wall suction with q4 saline flush   Diet NPO time specified   TPN (2 in 1) Adult Continuous    Bowel regimen: none   Analgesia: dilaudid 0.65m q2prn, methadone 1.362mBID   Antiemetics prn: zofran 63m29m Home meds: synthroid 83m7mvery day, lorazepam 0.5mg 39ms   OOB/ambulate   DVT ppx: lovenox   Dispo: pending results of EGD monday    Author: AabraWilburn Corneliaf: 09/21/2017  at: 8:38 AM     [facility] Surgery Attending for Dr. GalkaRon AgeeI saw and evaluated the patient. I have reviewed and edited the resident's/fellow's note and confirm the findings and plan of care as documented above. Manual aspiration of Gtube reveals clear salivary fluid. The patient is having bowel function. Plan EGD possible dilation tomorrow.    Diagnoses for this hospitalization include:  Failure to Thrive  Moderate Malnutrition   TPN to treat these      Caillou Minus Amanda Galloway

## 2017-09-22 ENCOUNTER — Other Ambulatory Visit: Payer: Self-pay | Admitting: Cardiology

## 2017-09-22 LAB — CBC
Hematocrit: 23 % — ABNORMAL LOW (ref 34–45)
Hemoglobin: 7.7 g/dL — ABNORMAL LOW (ref 11.2–15.7)
MCH: 29 pg/cell (ref 26–32)
MCHC: 33 g/dL (ref 32–36)
MCV: 89 fL (ref 79–95)
Platelets: 386 10*3/uL — ABNORMAL HIGH (ref 160–370)
RBC: 2.6 MIL/uL — ABNORMAL LOW (ref 3.9–5.2)
RDW: 16.2 % — ABNORMAL HIGH (ref 11.7–14.4)
WBC: 7.7 10*3/uL (ref 4.0–10.0)

## 2017-09-22 LAB — PHOSPHORUS: Phosphorus: 3.6 mg/dL (ref 2.7–4.5)

## 2017-09-22 LAB — COMPREHENSIVE METABOLIC PANEL
ALT: 27 U/L (ref 0–35)
AST: 37 U/L — ABNORMAL HIGH (ref 0–35)
Albumin: 3.1 g/dL — ABNORMAL LOW (ref 3.5–5.2)
Alk Phos: 531 U/L — ABNORMAL HIGH (ref 35–105)
Anion Gap: 13 (ref 7–16)
Bilirubin,Total: 0.2 mg/dL (ref 0.0–1.2)
CO2: 24 mmol/L (ref 20–28)
Calcium: 8.7 mg/dL (ref 8.6–10.2)
Chloride: 95 mmol/L — ABNORMAL LOW (ref 96–108)
Creatinine: 0.52 mg/dL (ref 0.51–0.95)
GFR,Black: 111 *
GFR,Caucasian: 96 *
Glucose: 115 mg/dL — ABNORMAL HIGH (ref 60–99)
Lab: 13 mg/dL (ref 6–20)
Potassium: 3.6 mmol/L (ref 3.3–5.1)
Sodium: 132 mmol/L — ABNORMAL LOW (ref 133–145)
Total Protein: 6.5 g/dL (ref 6.3–7.7)

## 2017-09-22 LAB — POCT GLUCOSE
Glucose POCT: 118 mg/dL — ABNORMAL HIGH (ref 60–99)
Glucose POCT: 119 mg/dL — ABNORMAL HIGH (ref 60–99)
Glucose POCT: 114 mg/dL — ABNORMAL HIGH (ref 60–99)

## 2017-09-22 LAB — MAGNESIUM: Magnesium: 1.7 mg/dL (ref 1.6–2.5)

## 2017-09-22 MED ORDER — FAT EMULSION SOYBEAN OIL 20 % IV EMUL *WRAPPED*
56.0000 g | Freq: Every day | INTRAVENOUS | Status: AC
Start: 2017-09-22 — End: 2017-09-23
  Administered 2017-09-22: 56 g via INTRAVENOUS
  Filled 2017-09-22: qty 280

## 2017-09-22 MED ORDER — SODIUM CHLORIDE 0.9 % 100 ML IV SOLN *I*
12.5000 mg | Freq: Four times a day (QID) | INTRAVENOUS | Status: DC | PRN
Start: 2017-09-22 — End: 2017-09-22

## 2017-09-22 MED ORDER — SODIUM CHLORIDE 0.9 % 100 ML IV SOLN *I*
6.2500 mg | Freq: Four times a day (QID) | INTRAVENOUS | Status: DC | PRN
Start: 2017-09-22 — End: 2017-09-25

## 2017-09-22 MED ORDER — ACETAMINOPHEN 10 MG/ML IV SOLN *I*
1000.0000 mg | Freq: Three times a day (TID) | INTRAVENOUS | Status: AC
Start: 2017-09-22 — End: 2017-09-23
  Administered 2017-09-22 – 2017-09-23 (×2): 1000 mg via INTRAVENOUS
  Filled 2017-09-22 (×2): qty 100

## 2017-09-22 MED ORDER — SUMATRIPTAN SUCCINATE 12 MG/ML SC SOLUTION *WRAPPED*
6.0000 mg | Freq: Once | SUBCUTANEOUS | Status: AC
Start: 2017-09-22 — End: 2017-09-22
  Administered 2017-09-22: 6 mg via SUBCUTANEOUS
  Filled 2017-09-22: qty 0.5

## 2017-09-22 MED ORDER — FAT EMULSION SOYBEAN OIL 20 % IV EMUL *WRAPPED*
56.0000 g | Freq: Every day | INTRAVENOUS | Status: DC
Start: 2017-09-22 — End: 2017-09-22

## 2017-09-22 MED ORDER — STERILE WATER FOR INJECTION (TPN USE ONLY) *I*
INTRAVENOUS | Status: DC
Start: 2017-09-22 — End: 2017-09-22

## 2017-09-22 MED ORDER — LORAZEPAM 2 MG/ML IJ SOLN *I*
0.5000 mg | Freq: Four times a day (QID) | INTRAMUSCULAR | Status: DC | PRN
Start: 2017-09-22 — End: 2017-09-24

## 2017-09-22 MED ORDER — ACETAMINOPHEN 500 MG PO TABS *I*
1000.0000 mg | ORAL_TABLET | Freq: Three times a day (TID) | ORAL | Status: DC
Start: 2017-09-23 — End: 2017-09-25
  Administered 2017-09-23 – 2017-09-25 (×4): 1000 mg via ORAL
  Filled 2017-09-22 (×4): qty 2

## 2017-09-22 MED ORDER — LORAZEPAM 2 MG/ML IJ SOLN *I*
0.5000 mg | Freq: Every evening | INTRAMUSCULAR | Status: DC | PRN
Start: 2017-09-22 — End: 2017-09-24
  Administered 2017-09-24: 0.5 mg via INTRAVENOUS
  Filled 2017-09-22: qty 1

## 2017-09-22 MED ORDER — LORAZEPAM 0.5 MG PO TABS *I*
0.5000 mg | ORAL_TABLET | Freq: Four times a day (QID) | ORAL | Status: DC | PRN
Start: 2017-09-22 — End: 2017-09-22

## 2017-09-22 MED ORDER — STERILE WATER FOR INJECTION (TPN USE ONLY) *I*
INTRAVENOUS | Status: AC
Start: 2017-09-22 — End: 2017-09-23
  Filled 2017-09-22: qty 561.6

## 2017-09-22 MED ORDER — HYDROMORPHONE HCL 2 MG/ML IJ SOLN *WRAPPED*
0.5000 mg | INTRAMUSCULAR | Status: DC | PRN
Start: 2017-09-22 — End: 2017-09-24
  Administered 2017-09-22 (×2): 0.5 mg via INTRAVENOUS
  Filled 2017-09-22 (×3): qty 1

## 2017-09-23 ENCOUNTER — Inpatient Hospital Stay: Payer: Medicare (Managed Care) | Admitting: Liver Pancreas GI Surgery

## 2017-09-23 LAB — COMPREHENSIVE METABOLIC PANEL
ALT: 30 U/L (ref 0–35)
AST: 38 U/L — ABNORMAL HIGH (ref 0–35)
Albumin: 3.2 g/dL — ABNORMAL LOW (ref 3.5–5.2)
Alk Phos: 529 U/L — ABNORMAL HIGH (ref 35–105)
Anion Gap: 14 (ref 7–16)
Bilirubin,Total: 0.2 mg/dL (ref 0.0–1.2)
CO2: 22 mmol/L (ref 20–28)
Calcium: 8.4 mg/dL — ABNORMAL LOW (ref 8.6–10.2)
Chloride: 91 mmol/L — ABNORMAL LOW (ref 96–108)
Creatinine: 0.5 mg/dL — ABNORMAL LOW (ref 0.51–0.95)
GFR,Black: 112 *
GFR,Caucasian: 98 *
Glucose: 116 mg/dL — ABNORMAL HIGH (ref 60–99)
Lab: 16 mg/dL (ref 6–20)
Potassium: 3.3 mmol/L (ref 3.3–5.1)
Sodium: 127 mmol/L — ABNORMAL LOW (ref 133–145)
Total Protein: 6.8 g/dL (ref 6.3–7.7)

## 2017-09-23 LAB — CBC
Hematocrit: 23 % — ABNORMAL LOW (ref 34–45)
Hematocrit: 26 % — ABNORMAL LOW (ref 34–45)
Hemoglobin: 7.6 g/dL — ABNORMAL LOW (ref 11.2–15.7)
Hemoglobin: 8.6 g/dL — ABNORMAL LOW (ref 11.2–15.7)
MCH: 30 pg/cell (ref 26–32)
MCH: 30 pg/cell (ref 26–32)
MCHC: 33 g/dL (ref 32–36)
MCHC: 33 g/dL (ref 32–36)
MCV: 89 fL (ref 79–95)
MCV: 90 fL (ref 79–95)
Platelets: 356 10*3/uL (ref 160–370)
Platelets: 401 10*3/uL — ABNORMAL HIGH (ref 160–370)
RBC: 2.6 MIL/uL — ABNORMAL LOW (ref 3.9–5.2)
RBC: 2.9 MIL/uL — ABNORMAL LOW (ref 3.9–5.2)
RDW: 15.9 % — ABNORMAL HIGH (ref 11.7–14.4)
RDW: 16.1 % — ABNORMAL HIGH (ref 11.7–14.4)
WBC: 11.9 10*3/uL — ABNORMAL HIGH (ref 4.0–10.0)
WBC: 7.5 10*3/uL (ref 4.0–10.0)

## 2017-09-23 LAB — POCT GLUCOSE
Glucose POCT: 113 mg/dL — ABNORMAL HIGH (ref 60–99)
Glucose POCT: 123 mg/dL — ABNORMAL HIGH (ref 60–99)
Glucose POCT: 125 mg/dL — ABNORMAL HIGH (ref 60–99)
Glucose POCT: 136 mg/dL — ABNORMAL HIGH (ref 60–99)
Glucose POCT: 143 mg/dL — ABNORMAL HIGH (ref 60–99)

## 2017-09-23 LAB — MAGNESIUM: Magnesium: 1.6 mg/dL (ref 1.6–2.5)

## 2017-09-23 LAB — TRIGLYCERIDES: Triglycerides: 297 mg/dL — AB

## 2017-09-23 LAB — PHOSPHORUS: Phosphorus: 3.9 mg/dL (ref 2.7–4.5)

## 2017-09-23 MED ORDER — LACTATED RINGERS IV SOLN *I*
100.0000 mL/h | INTRAVENOUS | Status: DC
Start: 2017-09-23 — End: 2017-09-23

## 2017-09-23 MED ORDER — STERILE WATER FOR INJECTION (TPN USE ONLY) *I*
INTRAVENOUS | Status: DC
Start: 2017-09-23 — End: 2017-09-24
  Filled 2017-09-23: qty 561.6

## 2017-09-23 MED ORDER — FENTANYL CITRATE 50 MCG/ML IJ SOLN *WRAPPED*
INTRAMUSCULAR | Status: AC
Start: 2017-09-23 — End: 2017-09-23
  Filled 2017-09-23: qty 4

## 2017-09-23 MED ORDER — SODIUM CHLORIDE 0.9 % FLUSH FOR PUMPS *I*
0.0000 mL/h | INTRAVENOUS | Status: DC | PRN
Start: 2017-09-23 — End: 2017-09-25
  Administered 2017-09-24 (×2): 5 mL/h
  Administered 2017-09-24: 5 mL/h via INTRAVENOUS
  Administered 2017-09-25 (×2): 10 mL/h
  Administered 2017-09-25: 10 mL/h via INTRAVENOUS

## 2017-09-23 MED ORDER — FAT EMULSION SOYBEAN OIL 20 % IV EMUL *WRAPPED*
56.0000 g | Freq: Every day | INTRAVENOUS | Status: AC
Start: 2017-09-23 — End: 2017-09-24
  Administered 2017-09-23: 56 g via INTRAVENOUS
  Filled 2017-09-23: qty 280

## 2017-09-23 MED ORDER — MIDAZOLAM HCL 5 MG/5ML IJ SOLN *I*
INTRAMUSCULAR | Status: AC | PRN
Start: 2017-09-23 — End: 2017-09-23
  Administered 2017-09-23 (×3): 2 mg via INTRAVENOUS

## 2017-09-23 MED ORDER — DEXTROSE 5 % FLUSH FOR PUMPS *I*
0.0000 mL/h | INTRAVENOUS | Status: DC | PRN
Start: 2017-09-23 — End: 2017-09-25

## 2017-09-23 MED ORDER — MAGNESIUM SULFATE 2 GM IN 50 ML *WRAPPED*
2000.0000 mg | Freq: Once | INTRAVENOUS | Status: AC
Start: 2017-09-23 — End: 2017-09-23
  Administered 2017-09-23: 2000 mg via INTRAVENOUS
  Filled 2017-09-23: qty 50

## 2017-09-23 MED ORDER — POTASSIUM CHLORIDE 20 MEQ/50ML IV SOLN *I*
20.0000 meq | INTRAVENOUS | Status: AC
Start: 2017-09-23 — End: 2017-09-23
  Administered 2017-09-23 (×2): 20 meq via INTRAVENOUS
  Filled 2017-09-23 (×2): qty 50

## 2017-09-23 MED ORDER — FENTANYL CITRATE 50 MCG/ML IJ SOLN *WRAPPED*
INTRAMUSCULAR | Status: AC | PRN
Start: 2017-09-23 — End: 2017-09-23
  Administered 2017-09-23: 25 ug via INTRAVENOUS
  Administered 2017-09-23: 50 ug via INTRAVENOUS
  Administered 2017-09-23: 25 ug via INTRAVENOUS

## 2017-09-23 MED ORDER — IBUPROFEN 200 MG PO TABS *I*
600.0000 mg | ORAL_TABLET | Freq: Four times a day (QID) | ORAL | Status: DC | PRN
Start: 2017-09-23 — End: 2017-09-25
  Administered 2017-09-23: 600 mg via ORAL
  Filled 2017-09-23: qty 3

## 2017-09-23 MED ORDER — SUMATRIPTAN SUCCINATE 12 MG/ML SC SOLUTION *WRAPPED*
6.0000 mg | Freq: Once | SUBCUTANEOUS | Status: AC
Start: 2017-09-23 — End: 2017-09-23
  Administered 2017-09-23: 6 mg via SUBCUTANEOUS
  Filled 2017-09-23: qty 0.5

## 2017-09-23 MED ORDER — MIDAZOLAM HCL 5 MG/5ML IJ SOLN *I*
INTRAMUSCULAR | Status: AC
Start: 2017-09-23 — End: 2017-09-23
  Filled 2017-09-23: qty 10

## 2017-09-23 NOTE — Progress Notes (Signed)
Nursing Progress Note:    Assumed care at 19:30. No acute events this shift. A&O x3, VSS. Upon initial assessment, pt c/o migraine unrelieved with scheduled Ofirmev. Fredderick Severance, NP notified. New order for subcutaneous Imitrex, administered as ordered with positive results, pt declined any further intervention for pain for remainder of shift. At approximately 02:00, RN was alerted by charge nurse that pt's lipid infusion had run dry and noted that it ws due to the TPN and Lipid infusion lines being crossed in the pump channels when they were initially hung. TPN rate adjusted to 65 mL/hr as ordered and pharmacy called and provider notified by charge RN. No additional action required, see charge nurse progress note for additional information. Pt otherwise stable with no further issues or complaints at this time, voiding adequate amounts, denied nausea or vomiting, PEG tube remains to LIWS with q4 hour irrigation, unable to return irrigant volume from PEG, provider notified and no intervention required. Pt currently resting in bed, call light within reach. Please refer to flowsheets for complete assessment findings. Report given to Kate Sable, RN, will continue to monitor.    Ashley-Marie Jawanza Zambito, RN

## 2017-09-23 NOTE — Preop H&P (Signed)
InPATIENT PRE-PROCEDURE H&P    Chief Complaint / Indications for Procedure: Evaluate for gastric outlet obstruction. Previously 2 EGD's on 7/18 and 7/19 with retained food debris and barium limiting evaluation for GOO.     Past Medical History:     Past Medical History:   Diagnosis Date    Acute kidney failure 03/31/2017    Cancer     Depression     Fibromyalgia     Hypothyroidism      Past Surgical History:   Procedure Laterality Date    APPENDECTOMY      CHOLECYSTECTOMY      CHOLECYSTECTOMY, LAPAROSCOPIC  09/06/2016    HYSTERECTOMY  08/1986    Fibroids    KNEE SURGERY Right     PR INSERT TUNNELED CV CATH WITH PORT Right 10/04/2016    Procedure: Right IJ MEDIPORT Insertion;  Surgeon: Delano Metz, MD;  Location: [facility] MAIN OR;  Service: Oncology General    PR LAP,DIAGNOSTIC ABDOMEN N/A 10/04/2016    Procedure: LAPAROSCOPY DIAGNOSTIC;  Surgeon: Delano Metz, MD;  Location: [facility] MAIN OR;  Service: Oncology General    PR PART [place] PANC,PROX+REMV DUOD+ANAST N/A 03/29/2017    Procedure: WHIPPLE PROCEDURE;  Surgeon: Delano Metz, MD;  Location: [facility] MAIN OR;  Service: Oncology General    ROTATOR CUFF REPAIR Right     TONSILLECTOMY AND ADENOIDECTOMY       Family History   Problem Relation Age of Onset    Breast cancer Mother         died 9    Cancer Father         NHL, died 63    Heart Disease Father     Diabetes Sister     Obesity Sister         4 siblings     Social History     Social History    Marital status: Divorced     Spouse name: N/A    Number of children: N/A    Years of education: N/A     Social History Main Topics    Smoking status: Never Smoker    Smokeless tobacco: Never Used    Alcohol use No    Drug use: No    Sexual activity: Not Asked     Other Topics Concern    None     Social History Narrative    None       Allergies:  No Known Allergies (drug, envir, food or latex)    Medications:  Current Facility-Administered Medications   Medication Dose Route Frequency    sodium chloride 0.9 %  FLUSH REQUIRED IF PATIENT HAS IV  0-500 mL/hr Intravenous PRN    dextrose 5 % FLUSH REQUIRED IF PATIENT HAS IV  0-500 mL/hr Intravenous PRN    Lactated Ringers Infusion  100 mL/hr Intravenous Continuous    LORazepam (ATIVAN) 2 mg/mL injection 0.5 mg  0.5 mg Intravenous QHS PRN    HYDROmorphone (DILAUDID) injection 0.5 mg  0.5 mg Intravenous Q2H PRN    LORazepam (ATIVAN) 2 mg/mL injection 0.5 mg  0.5 mg Intravenous Q6H PRN    TPN (2 in 1) Adult Continuous   Intravenous Continuous    promethazine (PHENERGAN) 6.25 mg in sodium chloride 0.9% 25 mL IVPB  6.25 mg Intravenous Q6H PRN    acetaminophen (OFIRMEV) SOLN 1,000 mg  1,000 mg Intravenous Q8H    Followed by    acetaminophen (TYLENOL) tablet 1,000 mg  1,000 mg Oral Q8H  sodium chloride 0.9 % FLUSH REQUIRED IF PATIENT HAS IV  0-500 mL/hr Intravenous PRN    dextrose 5 % FLUSH REQUIRED IF PATIENT HAS IV  0-500 mL/hr Intravenous PRN    metoclopramide (REGLAN) injection 10 mg  10 mg Intravenous Q6H    sodium chloride 0.9 % flush 10 mL  10 mL Intracatheter Q8H PRN    sodium chloride 0.9 % flush 10 mL  10 mL Intracatheter PRN    miconazole (MICATIN) 2 % powder   Topical 2 times per day    thiamine (B-1) injection 100 mg  100 mg Intravenous Daily    folic acid injection 1 mg  1 mg Intravenous Daily    levothyroxine (SYNTHROID) 20 MCG/ML injection 75 mcg  75 mcg Intravenous Daily @ 0700    ampicillin (OMNIPEN) 500 mg in sodium chloride 0.9 % mini-bag PLUS  500 mg Intravenous 4 times per day    pantoprazole (PROTONIX) 4 mg/ml injection 40 mg  40 mg Intravenous Q24H    ondansetron (ZOFRAN) injection 4 mg  4 mg Intravenous Q6H PRN    enoxaparin (LOVENOX) injection 40 mg  40 mg Subcutaneous Daily @ 2100      Vitals:    09/23/17 0757   BP: 110/56   Pulse: 83   Resp: 16   Temp: 37 C (98.6 F)   Weight:    Height:        ROS:  [id number]    Physical Examination:  Head/Nose/Throat:negative  Lungs:Lungs clear  Cardiovascular:normal S1 and S2  Abdomen: abdomen  soft, non-tender, nondistended, normal active bowel sounds, no masses or organomegaly      Lab Results:   All labs in the last 24 hours   Recent Results (from the past 24 hour(s))   POCT glucose    Collection Time: 09/22/17 11:52 AM   Result Value Ref Range    Glucose POCT 119 (H) 60 - 99 mg/dL   POCT glucose    Collection Time: 09/22/17  5:53 PM   Result Value Ref Range    Glucose POCT 114 (H) 60 - 99 mg/dL   Comprehensive metabolic panel    Collection Time: 09/23/17 12:03 AM   Result Value Ref Range    Sodium 127 (L) 133 - 145 mmol/L    Potassium 3.3 3.3 - 5.1 mmol/L    Chloride 91 (L) 96 - 108 mmol/L    CO2 22 20 - 28 mmol/L    Anion Gap 14 7 - 16    UN 16 6 - 20 mg/dL    Creatinine 0.50 (L) 0.51 - 0.95 mg/dL    GFR,Caucasian 98 *    GFR,Black 112 *    Glucose 116 (H) 60 - 99 mg/dL    Calcium 8.4 (L) 8.6 - 10.2 mg/dL    Total Protein 6.8 6.3 - 7.7 g/dL    Albumin 3.2 (L) 3.5 - 5.2 g/dL    Bilirubin,Total 0.2 0.0 - 1.2 mg/dL    AST 38 (H) 0 - 35 U/L    ALT 30 0 - 35 U/L    Alk Phos 529 (H) 35 - 105 U/L   CBC    Collection Time: 09/23/17 12:03 AM   Result Value Ref Range    WBC 7.5 4.0 - 10.0 THOU/uL    RBC 2.9 (L) 3.9 - 5.2 MIL/uL    Hemoglobin 8.6 (L) 11.2 - 15.7 g/dL    Hematocrit 26 (L) 34 - 45 %    MCV 90 79 -  95 fL    MCH 30 26 - 32 pg/cell    MCHC 33 32 - 36 g/dL    RDW 16.1 (H) 11.7 - 14.4 %    Platelets 401 (H) 160 - 370 THOU/uL   Magnesium    Collection Time: 09/23/17 12:03 AM   Result Value Ref Range    Magnesium 1.6 1.6 - 2.5 mg/dL   Phosphorus    Collection Time: 09/23/17 12:03 AM   Result Value Ref Range    Phosphorus 3.9 2.7 - 4.5 mg/dL   Triglycerides    Collection Time: 09/23/17 12:03 AM   Result Value Ref Range    Triglycerides 297 (!) mg/dL   POCT glucose    Collection Time: 09/23/17 12:08 AM   Result Value Ref Range    Glucose POCT 113 (H) 60 - 99 mg/dL   POCT glucose    Collection Time: 09/23/17  6:04 AM   Result Value Ref Range    Glucose POCT 136 (H) 60 - 99 mg/dL       Radiology impressions  (last 30 days):  Upper Gi  With Small Bowel Follow Through No Air (post Operative)    Result Date: 09/13/2017  09/13/2017 2:38 PM UPPER GI SERIES WITH SMALL BOWEL FOLLOW-THROUGH CLINICAL INFORMATION:   72 year old female with protracted hospital stay s/p WHipple 03/2017. Discharged 08/2017 with PEG.  Unable to tolerate oral or PEG feeds. + Nausea/vomiting bilious emesis.  Evaluate for stricture ; R11.2-Nausea with vomiting, unspecified COMPARISON:  Upper GI with KUB dated 06/21/2017 PROCEDURE: Spot and video fluoroscopic images of the esophagus, stomach and duodenum were performed after the patient ingested oral barium. AP radiographs of the stomach were performed at 30 minutes, 1.5 hours, 2 hours, 3.5 hours, and 4 hours. Compression of the abdomen and oblique views were obtained at 4 hours. This, overhead radiographs of the small bowel were obtained in various projections as barium traversed the small bowel. Fluoroscopic spot views were obtained. 6.1 minutes of fluoroscopy time. FINDINGS: Esophagus: Normal peristalsis and transit of contrast.  No intrinsic or extrinsic lesions are identified. GE junction: Normal in location and appearance. Stomach: Gastrostomy tube in place. Dilated appearance to the stomach ot approximately 10cm.. There is delayed gastric emptying. Contrast in the stomach did not proceed after 4 hours until oblique images were obtained. Unable to visualize the gastrointestinal anastomosis. Small bowel:  Normal caliber proximal small bowel is located in the left upper quadrant.  The remaining small bowel is normal in caliber and mucosal pattern.     1. Delayed transit of contrast in the stomach, indicating potentially function or mechanical obstruction of the gastrointestinal anastomosis. Recommend an endoscopy for further evaluation. END OF IMPRESSION I have personally reviewed the images and the Resident's/Fellow's interpretation and agree with or edited the findings. [facility] submits this DICOM  format image data and final report to the [employer], an independent secure electronic health information exchange, on a reciprocally searchable basis (with patient authorization) for a minimum of 12 months after exam date.    Picc Insertion > 5 Years    Result Date: 09/20/2017  Trish Fountain, RN     09/20/2017  1:35 PM PICC PROCEDURE NOTE Lot #:                 [id number]                      Size:5 French Double Lumen PICC Side:right Time out documentation completed  prior to procedure:  Yes Indications Nutrition/TPN Recommendations : PICC Line  Procedure Details Procedure Date :09/20/2017 Ultrasound used:yes Modified Seldinger technique used. Catheter exchange: no Vein Accessed: basilic Venipuncture attempts & sites: 1 Guide Wire advancement: easy Catheter Advancement: easy Switched arms: no Procedure: successful Guide Wire Removed : yes Refer to Radiology :No Findings  Catheter inserted to 36 cm, with 2 cm exposed.  Mid upper arm circumference is 27 cm, measured at 15 cm sup/ac. Complications  none Condition  Pre-Procedure PICC Site Pain Assessment: Scale used: Numeric Score: 0 Time: 1220 Post-Procedure PICC Site Pain Assessment: Scale used: NumericScore: 0 Time 1250 Comfort Measures Provided : Pain Medication as Ordered by Provider Plan/Orders  PICC tip verified by ECG Tip Confirmation System: Yes ECG rhythm strip placed in chart and uploaded to patient [facility] VBG WNL: Yes, VBG 35 STAT Portable Chest X-Ray Ordered :N/A Other : Floor Nurse Erich Montane, LPN aware that OK to Use PICC order must be placed prior to use of PICC. 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 Updates to Patient's Condition (to be completed by a provider privileged to complete a H&P, following reassessment of the patient by the provider):    Full H&P done today; no updates needed.    II. Procedure Readiness   I have reviewed the patient's H&P and updated condition. By completing and signing this form, I attest that this patient is ready for surgery/procedure.      III. Attestation   I have reviewed the updated information regarding the patient's condition and it is appropriate to proceed with the planned surgery/procedure.    Syble Creek, MD as of 8:32 AM 09/23/2017

## 2017-09-23 NOTE — Progress Notes (Signed)
At [place], Probation officer notified that pump was beeping "air-in-line". On assessment, writer noticed that fat infusion had run dry, causing the air in line alarm. Upon further investigation, fat line was running at 65 ml/hr while the TPN line was running at 23.3 ml/hr. Assist light activated for another nurse to verify and TPN rate changed to 65 ml/hr. NP notified and pharmacy called for possiblecomplications. Bedside nurse also notified about issue. Pt stable at this time. Will continue to monitor. Kathrynn Humble, RN

## 2017-09-23 NOTE — Progress Notes (Signed)
Pt AVSS, tolerating clears but states she feel bloated, PEG tube removed by provider, dressing remains CDI.  Pt c/o migraine unrelieved by ibuprofen, given imitrex with good relief.  Report given to next shift.

## 2017-09-23 NOTE — Progress Notes (Signed)
SPIRITUAL ASSESSMENT NOTE     Unit chaplain visited upon noticing patient has been re-admitted.  Patient is anxious about being in the hospital again but hopeful about going home. Prayed with patient for strengthening and nutrition, blessed her lunch menu for healing.  Will continue to visit patient as she is available during her current stay.     Spiritual Concern: Other (comment) (anxious abt hospitalization)        Patient Coping: Anxious, Open discussion     Chaplain Interventions: Normalized feelings, Prayer, Reflective listening    Plan of Care:  Unit chaplain will continue visiting as long as patient is on the unit.  Chaplain available on call 24/7.    Shirlean Kelly      11:30 AM on 09/23/2017

## 2017-09-23 NOTE — Plan of Care (Signed)
Problem: Glycemic control  Goal: Blood glucoses 70-180 mg/dl  Outcome: Progressing towards goal  Pt's BG remained between 70 and 180 mg/dl throughout shift.

## 2017-09-23 NOTE — Procedures (Addendum)
EGD Procedure Note   Date of Procedure: 09/23/2017    Referring Physician: Charolotte Eke, MD   Primary Care Provider: Provider, None   Attending Physician: Harlin Rain, MD  Fellow: Syble Creek, MD  Indication(s): Gastric outlet obstruction. Previous EGD on 7/18 and 7/19 with retained food products.     Medications: Fentanyl 100 mcg IV and Midazolam 6 mg IV were administered incrementally over the course of the procedure to achieve an adequate level of conscious sedation.  Moderate Sedation Face Times  Start Time: 8338  End Time: 0920  Duration (minutes): 27 Minutes        Endoscope: [id number]  Accessories:None    Procedure Description: Full disclosure of risks were reviewed with patient as detailed on the consent form. The patient was placed in the left lateral decubitus position and monitored with continuous pulse oximetry, interval blood pressure monitoring and direct observations.   After adequate sedation, the endoscope was carefully introduced into the oropharynx and passed in to the esophagus under direct visualization. The esophagus and GE junction were carefully examined. After advancement of the endoscope into the stomach, a careful examination was performed. The scope was then advanced into the small bowel which was carefully examined. The stomach was then decompressed and the endoscope withdrawn.   Findings and intervention(s) are detailed below. The patient was recovered in the GI recovery area    Findings:     Esophagus:   Slightly irregular z line. Otherwise normal esophagus.       Stomach/small bowel :   Noted was a small amount of liquid and semi-solid food debris, markedly improved from prior EGD.    Health G-J anastomosis. No stricture or ulcer.    We first encountered a limb straight ahead and intubated this apparent blind limb and it was largely unremarkable however could not advance distally at this limb to further evaluate.   We initially had a hard time localizing the  efferent limb of the gastrojejunostomy. We localized it to 11 o'clock at an angulation behind a fold and it appeared the G-tube was sitting at the level of the anastomosis. We then noticed that the G-tube balloon was nearly completely occluding the opening to the efferent limb at 11 o'clock (images 1 and 2 below). This presented much difficulty navigating the endoscope past. We then deflated the G-tube balloon and were then able to intubate the efferent limb beyond the tube. There efferent limb was largely unremarkable without any strictures or narrowing x 30 cm. We then re-inflated the gastric balloon to 15 cc (previously inflated to 20 cc). Manipulation of the balloon clearly identified this as causing outlet obstruction and is the etiology for GOO.    Deflated balloon:       Intervention(s):   None    Complication(s):   none    EBL: 0 ml    Impression(s):  1. Gastric outlet obstruction from G-tube balloon causing near complete occlusion of the opening of the presumed efferent limb at the level of the GJ anastomosis, as described above.     Recommendation(s):   Recommend removing the G tube to relieve the outlet obstruction.     If patient needs enteral tube for continued nutritional support, recommend re-placing it at another location either more proximally in the stomach or more distally in the small bowel.    Histopathologic Diagnosis:   none    Syble Creek, MD      GI ATTENDING    I was present for the entire  viewing portion of the exam and participated as needed throughout the procedure, and I concur with the findings and intervention descriptions as outlined in this edited note. I also ordered and supervised the moderate sedation.    Harlin Rain, MD

## 2017-09-23 NOTE — Progress Notes (Addendum)
[facility] Surgery Progress Note    Patient: Amanda Galloway    LOS: 3 days    Attending: Charolotte Eke, MD      INTERVAL HISTORY & SUBJECTIVE  NAE overnight. Resting peacefully this morning. NPO, Pain is well-controlled, Voiding spontaneously, denies passing gas in past 24 hours but has had bowel movement. +OOB. Denies nausea/vomiting, diarrhea, chest pain, shortness of breath, numbness/tingling. EGD scheduled for today     OBJECTIVE  Physical Exam:  Vitals:    09/20/17 1650 09/20/17 1945 09/21/17 0130 09/21/17 0403   BP: 98/58 100/52 100/50 100/50   BP Location: Left arm Left arm Left arm Left arm   Pulse: 83 82 81 82   Resp: '16 16 16 16   ' Temp: 37.2 C (99 F) 36.9 C (98.4 F) 36.3 C (97.3 F) 36.2 C (97.2 F)   TempSrc: Temporal Temporal Temporal Temporal   SpO2: 99% 98% 97% 96%   Weight:       Height:           GEN: no apparent distress  HEENT: NCAT, face symmetric  CHEST: regular rate and rhythm, no m/r/g  LUNGS: Clear to auscultation bilaterally in all fields, nonlabored respirations  ABD: soft, nondistended, nontender, G tube in place, dressing is clean and dry  NEURO/MOTOR: alert, appropriate  EXTREMITIES: atraumatic  VASCULAR: feet wwp, no edema    Intake/Output:  07/19 0700 - 07/20 0659  In: 3005 [I.V.:949.7]  Out: 2475 [Urine:1450]     Medications:  Current Facility-Administered Medications   Medication    sodium chloride 0.9 % FLUSH REQUIRED IF PATIENT HAS IV    dextrose 5 % FLUSH REQUIRED IF PATIENT HAS IV    metoclopramide (REGLAN) injection 10 mg    sodium chloride 0.9 % flush 10 mL    sodium chloride 0.9 % flush 10 mL    miconazole (MICATIN) 2 % powder    TPN (2 in 1) Adult Continuous    thiamine (B-1) injection 454 mg    folic acid injection 1 mg    levothyroxine (SYNTHROID) 20 MCG/ML injection 75 mcg    HYDROmorphone (DILAUDID) injection 0.5 mg    LORazepam (ATIVAN) 2 mg/mL injection 0.5 mg    ampicillin (OMNIPEN) 500 mg in sodium chloride 0.9 % mini-bag PLUS    pantoprazole  (PROTONIX) 4 mg/ml injection 40 mg    methadone (DOLOPHINE) 1.3 mg in sodium chloride 0.9 % IV syringe    ondansetron (ZOFRAN) injection 4 mg    enoxaparin (LOVENOX) injection 40 mg         Laboratory values:   Recent Labs      09/21/17   0041  09/20/17   1309  09/19/17   2328  09/18/17   2330   WBC  7.4   --   9.2  7.0   Hemoglobin  7.2*  8.8*  7.7*  7.2*   Hematocrit  23*   --   24*  23*   Platelets  369   --   399*  425*   INR   --    --    --   1.2*     No components found with this basename: APTT, PT Recent Labs      09/21/17   0041  09/19/17   2328   Sodium  134  135   Potassium  3.7  3.8   Chloride  99  102   CO2  23  20   UN  6  8  Creatinine  0.56  0.56   Glucose  117*  114*   Calcium  8.2*  8.2*   Magnesium  1.9  1.6   Phosphorus  3.6  3.2    Recent Labs      09/21/17   0041  09/19/17   2328   AST  43*  45*   ALT  31  29   Alk Phos  511*  517*   Bilirubin,Total  0.2  0.3     Recent Labs      09/21/17   0041  09/19/17   2328   09/18/17   0956   Total Protein  6.4  6.4   < >  8.3*   Albumin  3.0*  3.0*   < >  4.0   Prealbumin   --   11*   --   15*    < > = values in this interval not displayed.     Recent Labs      09/18/17   1746   Amylase  95   Lipase  8*      GLUCOSE:   Recent Labs      09/21/17   0608  09/21/17   0036  09/20/17   1717   Glucose POCT  125*  77  116*       Imaging: No results found.     ASSESSMENT  Amanda Galloway is a 72 y.o. female with h/o whipple procedure with complicated/prolonged hospital stay for failure to thrive due to chronic nausea/vomiting, poor PO intake with suspected gastric outlet obstruction vs gastroparesis.     PLAN   GI to do EGD on today, much appreciated   No need for NG tube at this time   G tube to wall suction with q4 saline flush   Diet NPO time specified   TPN (2 in 1) Adult Continuous    Bowel regimen: none   Analgesia: dilaudid 0.15m q2prn, methadone 1.310mBID   Antiemetics prn: zofran 70m69m Home meds: synthroid 77m73mvery day, lorazepam 0.5mg 31ms   OOB/ambulate   DVT ppx: lovenox   Dispo: [facility]5 [id number]: AabraWilburn Corneliaf: 09/21/2017  at: 8:38 AM       [facility] and GI Surgery Attending:    I saw and evaluated the patient. I have reviewed and edited the resident's/fellow's note and confirm the findings and plan of care as documented above. EGD reveals PEG tube is occluding efferent limb. No room to pull back. Plan remove PEG.    Wilman Tucker Charolotte Eke

## 2017-09-23 NOTE — Consults (Signed)
Medical Nutrition Therapy Brief Note: TPN check    72 y.o. female with h/o whipple procedure with complicated/prolonged hospital stay who is admitted for failure to thrive due to chronic nausea/vomiting, poor PO intake with suspected gastric outlet obstruction vs gastroparesis.     Patient remains on TPN for nutrition support. Chart, labs, medications / TPN order, I/Os reviewed. Plan for EGD today. Na 127 (L), K+ 3.3 (WNL), Cl 91 (L), CO2 22 (WNLl), Ca 9.04 (WNL corrected for alb 3.2), PO4 3.9 (WNL), Mg 1.6 (WNL). Recent t bili 0.2 (WNL) from 7/22. Recent TGs 297 from 7/22. BGs ranging from 113 - 136 over the past 24 hours.  MAR reviewed with repletions noted for Mg sulfate. I/Os significant for 830 ml PEG output. Current TPN order reviewed - please see recommendations below for changes.    Recommendations:  1.  Continue to monitor K+/Phos/Mg++ daily as TPN is advanced to goal. Replete outside of TPN, as needed.  2.  Advance to day 3 dextrose (goal) Adult Continuous plus Fat (central infusion site):     Rate: 65 mL/hr    Volume (Amino Acid/Dextrose solution): 1560 mL/day    Amino acids: 54 g/L     Dextrose: 183 g/L     Additives as follows:     Na: 76 mEq/L     Maximize Chloride    Insulin: 0 units/LITER   Fat Emulsion 20% infusion: 56 gm/day (to be infused over 12 hours)  Provides 1868 kcal/day, 84 g protein/day, 285 g dextrose/day and 1840 mL total fluid/day (including amino acid/dextrose solution and lipid volume).   3.   Continue TPN lab monitoring       Will continue to monitor.     Laurence Aly, [place]  Pager [phone]

## 2017-09-24 ENCOUNTER — Ambulatory Visit: Payer: Medicare (Managed Care) | Admitting: Hospice and Palliative Medicine

## 2017-09-24 LAB — COMPREHENSIVE METABOLIC PANEL
ALT: 27 U/L (ref 0–35)
AST: 39 U/L — ABNORMAL HIGH (ref 0–35)
Albumin: 3 g/dL — ABNORMAL LOW (ref 3.5–5.2)
Alk Phos: 475 U/L — ABNORMAL HIGH (ref 35–105)
Anion Gap: 14 (ref 7–16)
Bilirubin,Total: <0.2 mg/dL (ref 0.0–1.2)
CO2: 21 mmol/L (ref 20–28)
Calcium: 8.3 mg/dL — ABNORMAL LOW (ref 8.6–10.2)
Chloride: 90 mmol/L — ABNORMAL LOW (ref 96–108)
Creatinine: 0.5 mg/dL — ABNORMAL LOW (ref 0.51–0.95)
GFR,Black: 112 *
GFR,Caucasian: 98 *
Glucose: 129 mg/dL — ABNORMAL HIGH (ref 60–99)
Lab: 14 mg/dL (ref 6–20)
Potassium: 3.7 mmol/L (ref 3.3–5.1)
Sodium: 125 mmol/L — ABNORMAL LOW (ref 133–145)
Total Protein: 6.6 g/dL (ref 6.3–7.7)

## 2017-09-24 LAB — MAGNESIUM: Magnesium: 1.8 mg/dL (ref 1.6–2.5)

## 2017-09-24 LAB — POCT GLUCOSE
Glucose POCT: 128 mg/dL — ABNORMAL HIGH (ref 60–99)
Glucose POCT: 97 mg/dL (ref 60–99)

## 2017-09-24 LAB — PREALBUMIN: Prealbumin: 12 mg/dL — ABNORMAL LOW (ref 20–40)

## 2017-09-24 LAB — PHOSPHORUS: Phosphorus: 2.7 mg/dL (ref 2.7–4.5)

## 2017-09-24 MED ORDER — SODIUM CHLORIDE 1 GM PO TABS *I*
1.0000 g | ORAL_TABLET | Freq: Three times a day (TID) | ORAL | Status: DC
Start: 2017-09-24 — End: 2017-09-25
  Administered 2017-09-24 – 2017-09-25 (×4): 1 g via GASTROSTOMY
  Filled 2017-09-24 (×8): qty 1

## 2017-09-24 MED ORDER — MAGNESIUM SULFATE 2 GM IN 50 ML *WRAPPED*
2000.0000 mg | Freq: Once | INTRAVENOUS | Status: AC
Start: 2017-09-24 — End: 2017-09-24
  Administered 2017-09-24: 2000 mg via INTRAVENOUS
  Filled 2017-09-24: qty 50

## 2017-09-24 MED ORDER — STERILE WATER FOR INJECTION (TPN USE ONLY) *I*
INTRAVENOUS | Status: DC
Start: 2017-09-24 — End: 2017-09-24
  Filled 2017-09-24: qty 285.12

## 2017-09-24 MED ORDER — THIAMINE HCL 100 MG PO TABS *WRAPPED*
100.0000 mg | ORAL_TABLET | Freq: Every day | ORAL | Status: DC
Start: 2017-09-24 — End: 2017-09-25
  Administered 2017-09-24 – 2017-09-25 (×2): 100 mg via ORAL
  Filled 2017-09-24 (×3): qty 1

## 2017-09-24 MED ORDER — MELATONIN 3 MG PO TABS *I*
3.0000 mg | ORAL_TABLET | Freq: Every evening | ORAL | Status: DC | PRN
Start: 2017-09-24 — End: 2017-09-25
  Administered 2017-09-24: 3 mg via ORAL
  Filled 2017-09-24 (×2): qty 1

## 2017-09-24 MED ORDER — SUMATRIPTAN SUCCINATE 25 MG PO TABS *I*
25.0000 mg | ORAL_TABLET | Freq: Every evening | ORAL | Status: DC
Start: 2017-09-24 — End: 2017-09-25
  Administered 2017-09-24: 25 mg via ORAL
  Filled 2017-09-24 (×3): qty 1

## 2017-09-24 MED ORDER — METOCLOPRAMIDE HCL 10 MG PO TABS *I*
10.0000 mg | ORAL_TABLET | Freq: Four times a day (QID) | ORAL | Status: DC
Start: 2017-09-24 — End: 2017-09-25
  Administered 2017-09-24 – 2017-09-25 (×4): 10 mg via ORAL
  Filled 2017-09-24 (×9): qty 1

## 2017-09-24 MED ORDER — POTASSIUM CHLORIDE 10 MEQ/50ML IV SOLN *I*
10.0000 meq | Freq: Once | INTRAVENOUS | Status: AC
Start: 2017-09-24 — End: 2017-09-24
  Administered 2017-09-24: 10 meq via INTRAVENOUS
  Filled 2017-09-24: qty 50

## 2017-09-24 MED ORDER — PANTOPRAZOLE SODIUM 40 MG PO TBEC *I*
40.0000 mg | DELAYED_RELEASE_TABLET | Freq: Every morning | ORAL | Status: DC
Start: 2017-09-24 — End: 2017-09-25
  Administered 2017-09-24 – 2017-09-25 (×2): 40 mg via ORAL
  Filled 2017-09-24 (×3): qty 1

## 2017-09-24 MED ORDER — SUMATRIPTAN SUCCINATE 25 MG PO TABS *I*
25.0000 mg | ORAL_TABLET | Freq: Once | ORAL | Status: AC
Start: 2017-09-24 — End: 2017-09-24
  Administered 2017-09-24: 25 mg via ORAL
  Filled 2017-09-24: qty 1

## 2017-09-24 NOTE — Student Note (Signed)
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 Her stay was prolonged in the hospital with the placement of a G-tube due to emesis with oral intake. She was readmitted due to nausea and emesis with oral intake. She has had 3 EGDs demonstrating delayed gastric transit with  obstruction of the GI anastomosis. Her PEG tube was removed last night (7/22). She has tolerated PO clear liquids and is being advanced to full liquids + Ensure. She does not endorse N/V. She has had a bowel movement and has had flatus.      OBJECTIVE  Physical Exam:  Temp:  [36.4 C (97.5 F)-36.9 C (98.4 F)] 36.4 C (97.5 F)  Heart Rate:  [82-125] 93  Resp:  [14-18] 16  BP: (92-179)/(52-125) 120/60     GEN: no apparent distress  HEENT: NCAT, face symmetric  CHEST: nonlabored respirations  ABD: soft, nondistended, nontender  NEURO/MOTOR: alert, appropriate  EXTREMITIES: atraumatic  VASCULAR: feet wwp, no edema    Intake/Output:  07/22 0700 - 07/23 0659  In: 3577.3 [P.O.:815; I.V.:321]  Out: 775 [Urine:700]     Medications:  Current Facility-Administered Medications   Medication    sodium chloride tablet 1 g    pantoprazole (PROTONIX) EC tablet 40 mg    metoclopramide (REGLAN) tablet 10 mg    sodium chloride 0.9 % FLUSH REQUIRED IF PATIENT HAS IV    dextrose 5 % FLUSH REQUIRED IF PATIENT HAS IV    TPN (2 in 1) Adult Continuous    ibuprofen (ADVIL,MOTRIN) tablet 600 mg    promethazine (PHENERGAN) 6.25 mg in sodium chloride 0.9% 25 mL IVPB    acetaminophen (TYLENOL) tablet 1,000 mg    sodium chloride 0.9 % FLUSH REQUIRED IF PATIENT HAS IV    dextrose 5 % FLUSH REQUIRED IF PATIENT HAS IV    sodium chloride 0.9 % flush 10 mL    sodium chloride 0.9 % flush 10 mL    miconazole (MICATIN) 2 % powder    thiamine (B-1) injection  254 mg    folic acid injection 1 mg    levothyroxine (SYNTHROID) 20 MCG/ML injection 75 mcg    ampicillin (OMNIPEN) 500 mg in sodium chloride 0.9 % mini-bag PLUS    ondansetron (ZOFRAN) injection 4 mg    enoxaparin (LOVENOX) injection 40 mg         Laboratory values:   Recent Labs      09/23/17   2335  09/23/17   0003   WBC  11.9*  7.5   Hemoglobin  7.6*  8.6*   Hematocrit  23*  26*   Platelets  356  401*     No components found with this basename: APTT, PT Recent Labs      09/23/17   2335  09/23/17   0003   Sodium  125*  127*   Potassium  3.7  3.3   Chloride  90*  91*   CO2  21  22   UN  14  16   Creatinine  0.50*  0.50*   Glucose  129*  116*   Calcium  8.3*  8.4*   Magnesium  1.8  1.6   Phosphorus  2.7  3.9    Recent Labs  09/23/17   2335  09/23/17   0003   AST  39*  38*   ALT  27  30   Alk Phos  475*  529*   Bilirubin,Total  <0.2  0.2     Recent Labs      09/23/17   2335  09/23/17   0003   Total Protein  6.6  6.8   Albumin  3.0*  3.2*   Prealbumin  12*   --      No results for input(s): AMY, LIP in the last 72 hours.   GLUCOSE:   Recent Labs      09/24/17   0625  09/23/17   2334  09/23/17   1805  09/23/17   1228  09/23/17   0604  09/23/17   0008  09/22/17   1753  09/22/17   1152  09/22/17   0646  09/21/17   2346   Glucose POCT  128*  125*  123*  143*  136*  113*  114*  119*  118*  112*       Imaging: No results found.     ASSESSMENT  Amanda Galloway is a 72 y.o. female with h/o a Whipple Procedure for pancreatic carcinoma complicated by a prolonged hospital stay. She is HOD#3 for failure to thrive due to chronic nausea & vomiting with oral intake with gastric outlet obstruction caused by the balloon from the PEG tube.       PLAN   Taper TPN and discontinue today    Bowel regimen: none   Diet: full liquids + Ensure as tolerated   Analgesia, antiemetics prn   Home meds: synthroid 9mg daily, larazepam 0.5 mg qhs   OOB/ambulate; PT recs   DVT ppx   Dispo: [facility]   Author:Caleb Poppas of:  09/24/2017  at: 8:49 AM

## 2017-09-24 NOTE — Plan of Care (Signed)
Glycemic control    • Blood glucoses 70-180 mg/dl Progressing towards goal        Mobility    • Functional status is maintained or improved - Geriatric Progressing towards goal        Nutrition    • Nutritional status is maintained or improved - Geriatric Progressing towards goal        Pain/Comfort    • Patient's pain or discomfort is manageable Progressing towards goal        Psychosocial    • Demonstrates ability to cope with illness Progressing towards goal        Safety    • Patient will remain free of falls Progressing towards goal

## 2017-09-24 NOTE — Consults (Signed)
Medical Nutrition Therapy Brief Note: TPN check    Patient remains on TPN for nutrition support. Chart, labs, medications / TPN order, I/Os reviewed. Now s/p PEG removal- per EGD GOO from G-tube balloon caused a near complete occlusion of the opening of the presumed efferent limb at the [place] anastomosis. Na 125 (L), K+ 3.7 (WNL), Cl 90 (L), CO2 21 (WNL), Ca 9.1 (WNL corrected for albumin 3.0), PO4 2.7 (WNL), Mg 2.7 (WNL). BGs ranging from 123 - 143 over the past 24 hours.  MAR reviewed with repletions noted for Mg sulfate, KCl. I/Os significant for +635 ml PO. Current TPN order reviewed - please see recommendations below for changes.    Estimated Nutrient Needs:   (Based on 62.1 kg)                   1550-1870 kcal/day (25-30 kcal/kg)              75-93 g protein/day (1.2-1.5 g/kg)               1550-1870 mL fluid/day (25-30 mL/kg)    Recommendations:  1.  Recommend TPN as follows: Adult Continuous plus Fat (central infusion site):     Rate: 55 mL/hr    Volume (Amino Acid/Dextrose solution): 1320 mL/day    Amino acids: 64 g/L     Dextrose: 216 g/L     Additives as follows:     Na: 76 mEq/L          Maximize Chloride    Insulin: 0 units/LITER   Fat Emulsion 20% infusion: 56 gm/day (to be infused over 12 hours)  Provides 1867 kcal/day, 84 g protein/day, 285 g dextrose/day and 1600 mL total fluid/day (including amino acid/dextrose solution and lipid volume).   2.  Continue TPN lab monitoring with repletion prn   3.  Continue full liquid diet with advancement per team. Continue Ensure Enlive TID    Will continue to monitor.     Laurence Aly, [place]  Pager [phone]

## 2017-09-24 NOTE — Plan of Care (Signed)
Psychosocial     Demonstrates ability to cope with illness Maintaining        Safety     Patient will remain free of falls Maintaining          Glycemic control     Blood glucoses 70-180 mg/dl Progressing towards goal        Mobility     Functional status is maintained or improved - Geriatric Progressing towards goal        Nutrition     Nutritional status is maintained or improved - Geriatric Progressing towards goal        Pain/Comfort     Patient's pain or discomfort is manageable Progressing towards goal

## 2017-09-24 NOTE — Progress Notes (Addendum)
[facility] Surgery Progress Note    Patient: Amanda Galloway    LOS: 3 days    Attending: Charolotte Eke, MD      INTERVAL HISTORY & SUBJECTIVE  NAE overnight. Resting peacefully this morning.  PEG removed uneventfully last night  Tolerating clears  Had a BM and flatus NPO  Denies nausea/vomiting, diarrhea, chest pain, shortness of breath, numbness/tingling.     Will see how she gets on with some diet advancement     OBJECTIVE  Physical Exam:  Vitals:    09/20/17 1650 09/20/17 1945 09/21/17 0130 09/21/17 0403   BP: 98/58 100/52 100/50 100/50   BP Location: Left arm Left arm Left arm Left arm   Pulse: 83 82 81 82   Resp: '16 16 16 16   ' Temp: 37.2 C (99 F) 36.9 C (98.4 F) 36.3 C (97.3 F) 36.2 C (97.2 F)   TempSrc: Temporal Temporal Temporal Temporal   SpO2: 99% 98% 97% 96%   Weight:       Height:           GEN: no apparent distress  HEENT: NCAT, face symmetric  CHEST: regular rate and rhythm, no m/r/g  LUNGS: Clear to auscultation bilaterally in all fields, nonlabored respirations  ABD: soft, nondistended, nontender, G tube in place, dressing is clean and dry  NEURO/MOTOR: alert, appropriate  EXTREMITIES: atraumatic  VASCULAR: feet wwp, no edema    Intake/Output:  07/19 0700 - 07/20 0659  In: 3005 [I.V.:949.7]  Out: 2475 [Urine:1450]     Medications:  Current Facility-Administered Medications   Medication    sodium chloride 0.9 % FLUSH REQUIRED IF PATIENT HAS IV    dextrose 5 % FLUSH REQUIRED IF PATIENT HAS IV    metoclopramide (REGLAN) injection 10 mg    sodium chloride 0.9 % flush 10 mL    sodium chloride 0.9 % flush 10 mL    miconazole (MICATIN) 2 % powder    TPN (2 in 1) Adult Continuous    thiamine (B-1) injection 818 mg    folic acid injection 1 mg    levothyroxine (SYNTHROID) 20 MCG/ML injection 75 mcg    HYDROmorphone (DILAUDID) injection 0.5 mg    LORazepam (ATIVAN) 2 mg/mL injection 0.5 mg    ampicillin (OMNIPEN) 500 mg in sodium chloride 0.9 % mini-bag PLUS    pantoprazole (PROTONIX) 4  mg/ml injection 40 mg    methadone (DOLOPHINE) 1.3 mg in sodium chloride 0.9 % IV syringe    ondansetron (ZOFRAN) injection 4 mg    enoxaparin (LOVENOX) injection 40 mg         Laboratory values:   Recent Labs      09/21/17   0041  09/20/17   1309  09/19/17   2328  09/18/17   2330   WBC  7.4   --   9.2  7.0   Hemoglobin  7.2*  8.8*  7.7*  7.2*   Hematocrit  23*   --   24*  23*   Platelets  369   --   399*  425*   INR   --    --    --   1.2*     No components found with this basename: APTT, PT Recent Labs      09/21/17   0041  09/19/17   2328   Sodium  134  135   Potassium  3.7  3.8   Chloride  99  102   CO2  23  20   UN  6  8   Creatinine  0.56  0.56   Glucose  117*  114*   Calcium  8.2*  8.2*   Magnesium  1.9  1.6   Phosphorus  3.6  3.2    Recent Labs      09/21/17   0041  09/19/17   2328   AST  43*  45*   ALT  31  29   Alk Phos  511*  517*   Bilirubin,Total  0.2  0.3     Recent Labs      09/21/17   0041  09/19/17   2328   09/18/17   0956   Total Protein  6.4  6.4   < >  8.3*   Albumin  3.0*  3.0*   < >  4.0   Prealbumin   --   11*   --   15*    < > = values in this interval not displayed.     Recent Labs      09/18/17   1746   Amylase  95   Lipase  8*      GLUCOSE:   Recent Labs      09/21/17   0608  09/21/17   0036  09/20/17   1717   Glucose POCT  125*  77  116*       Imaging: No results found.     ASSESSMENT  Eric Morganti is a 72 y.o. female with h/o whipple procedure with complicated/prolonged hospital stay for failure to thrive due to chronic nausea/vomiting, poor PO intake with suspected gastric outlet obstruction vs gastroparesis. Her IR placed PEG tube was removed 7/22.    PLAN   Diet low residue; gastrectomy portions   TPN (2 in 1) Adult Continuous (wean to stop today)   Antiemetics prn: zofran 31m   Home meds: synthroid 713m every day, lorazepam 0.9m49mhs   OOB/ambulate   DVT ppx: lovenox   Dispo: [facility]; hopefully home tomorrow    EddCamille BalD  General Surgery Resident; PGY3  URMThe Ridge Behavioral Health Systemager:  [phone]: [phone]      [facility] and GI Surgery Attending:    I saw and evaluated the patient. I have reviewed and edited the resident's/fellow's note and confirm the findings and plan of care as documented above. looks quite well.  We will give her a diet today.  She is eager to go home and she will be able to tomorrow if she eats well.  Wean TPN.    LUKCharolotte EkeD

## 2017-09-24 NOTE — Plan of Care (Signed)
GI Plan of Care Note:    EGD report reviewed. Per chart review PEG removed yesterday, this will hopefully relieve her outlet obstruction. If PEG needs to be replaced it should be done in another location.    We will sign off at this time. Please let us know if there are any further questions/concerns or changes in the patient's clinical status.    Brett Canales, MD on 09/24/2017 at 7:08 AM. PGY4 GI Fellow [phone]

## 2017-09-25 ENCOUNTER — Telehealth: Payer: Self-pay | Admitting: Surgery

## 2017-09-25 LAB — CBC
Hematocrit: 23 % — ABNORMAL LOW (ref 34–45)
Hemoglobin: 7.6 g/dL — ABNORMAL LOW (ref 11.2–15.7)
MCH: 29 pg/cell (ref 26–32)
MCHC: 33 g/dL (ref 32–36)
MCV: 87 fL (ref 79–95)
Platelets: 333 10*3/uL (ref 160–370)
RBC: 2.7 MIL/uL — ABNORMAL LOW (ref 3.9–5.2)
RDW: 15.9 % — ABNORMAL HIGH (ref 11.7–14.4)
WBC: 9.2 10*3/uL (ref 4.0–10.0)

## 2017-09-25 LAB — COMPREHENSIVE METABOLIC PANEL
ALT: 34 U/L (ref 0–35)
AST: 49 U/L — ABNORMAL HIGH (ref 0–35)
Albumin: 3.2 g/dL — ABNORMAL LOW (ref 3.5–5.2)
Alk Phos: 494 U/L — ABNORMAL HIGH (ref 35–105)
Anion Gap: 14 (ref 7–16)
Bilirubin,Total: 0.4 mg/dL (ref 0.0–1.2)
CO2: 22 mmol/L (ref 20–28)
Calcium: 8.5 mg/dL — ABNORMAL LOW (ref 8.6–10.2)
Chloride: 88 mmol/L — ABNORMAL LOW (ref 96–108)
Creatinine: 0.48 mg/dL — ABNORMAL LOW (ref 0.51–0.95)
GFR,Black: 114 *
GFR,Caucasian: 99 *
Glucose: 106 mg/dL — ABNORMAL HIGH (ref 60–99)
Lab: 8 mg/dL (ref 6–20)
Potassium: 4.1 mmol/L (ref 3.3–5.1)
Sodium: 124 mmol/L — ABNORMAL LOW (ref 133–145)
Total Protein: 6.8 g/dL (ref 6.3–7.7)

## 2017-09-25 LAB — MAGNESIUM: Magnesium: 1.8 mg/dL (ref 1.6–2.5)

## 2017-09-25 LAB — EKG 12-LEAD
P: 32 deg
PR: 192 ms
QRS: 33 deg
QRSD: 88 ms
QT: 384 ms
QTc: 438 ms
Rate: 78 {beats}/min
T: 45 deg

## 2017-09-25 LAB — PHOSPHORUS: Phosphorus: 3.1 mg/dL (ref 2.7–4.5)

## 2017-09-25 MED ORDER — ONDANSETRON 4 MG PO TBDP *I*
4.0000 mg | ORAL_TABLET | Freq: Three times a day (TID) | ORAL | 0 refills | Status: DC | PRN
Start: 2017-09-25 — End: 2018-12-22

## 2017-09-25 MED ORDER — METOCLOPRAMIDE HCL 10 MG PO TABS *I*
10.0000 mg | ORAL_TABLET | Freq: Four times a day (QID) | ORAL | 0 refills | Status: DC
Start: 2017-09-25 — End: 2017-11-08

## 2017-09-25 MED ORDER — MELATONIN 3 MG PO TABS *I*
3.0000 mg | ORAL_TABLET | Freq: Every evening | ORAL | Status: DC | PRN
Start: 2017-09-25 — End: 2017-09-25
  Administered 2017-09-25: 3 mg via ORAL

## 2017-09-25 MED ORDER — SUMATRIPTAN SUCCINATE 25 MG PO TABS *I*
25.0000 mg | ORAL_TABLET | ORAL | 1 refills | Status: DC | PRN
Start: 2017-09-25 — End: 2018-09-11

## 2017-09-25 MED ORDER — PANTOPRAZOLE SODIUM 40 MG PO TBEC *I*
40.0000 mg | DELAYED_RELEASE_TABLET | Freq: Two times a day (BID) | ORAL | 0 refills | Status: DC
Start: 2017-09-25 — End: 2017-11-06

## 2017-09-25 MED ORDER — SODIUM CHLORIDE 1 GM PO TABS *I*
1.0000 g | ORAL_TABLET | Freq: Three times a day (TID) | ORAL | 0 refills | Status: AC
Start: 2017-09-25 — End: 2017-10-25

## 2017-09-25 MED ORDER — LEVOTHYROXINE SODIUM 150 MCG PO TABS *I*
150.0000 ug | ORAL_TABLET | Freq: Every day | ORAL | 0 refills | Status: DC
Start: 2017-09-25 — End: 2017-10-28

## 2017-09-25 MED ORDER — LACTOBACILLUS RHAMNOSUS (GG) PO CAPS *I*
1.0000 | ORAL_CAPSULE | Freq: Every day | ORAL | 0 refills | Status: AC
Start: 2017-09-25 — End: 2017-10-25

## 2017-09-25 MED ORDER — SODIUM CHLORIDE 1 GM PO TABS *I*
1.0000 g | ORAL_TABLET | Freq: Three times a day (TID) | ORAL | 0 refills | Status: DC
Start: 2017-09-25 — End: 2017-09-25

## 2017-09-25 MED ORDER — DOCUSATE SODIUM 100 MG PO CAPS *I*
200.0000 mg | ORAL_CAPSULE | Freq: Two times a day (BID) | ORAL | 5 refills | Status: DC
Start: 2017-09-25 — End: 2018-12-22

## 2017-09-25 NOTE — Progress Notes (Signed)
PICC Line Removal    The patient was placed in trendelenburg position.   The dressing was carefully removed and the PICC line was then removed from right upper arm while the patient actively bore down. Pressure applied to site for 5 minutes, no active bleeding noted. Sterile 2x2 and tegaderm dressing applied. No respiratory distress, cyanosis, hypotension, cardiac arrhythmias, and change in mental status noted after removal of line. Patient was instructed to avoid lifting with right arm and to leave dressing intact for 24 hours. Patient tolerated the procedure well.     Supervised by [employer] PA    Darrel Reach, NP 09/25/2017 9:19 AM

## 2017-09-25 NOTE — Telephone Encounter (Signed)
Patient is still having migraines and is requesting medication.  In the hospital, she used Imitrex.  Preferred pharmacy is [facility].

## 2017-09-25 NOTE — Progress Notes (Signed)
[facility] Surgery Progress Note    Patient: Amanda Galloway    LOS: 3 days    Attending: Charolotte Eke, MD      INTERVAL HISTORY & SUBJECTIVE  NAE overnight. Resting peacefully this morning.  Tolerating low res diet advanced last night  Had a BM and flatus NPO       Patient very eager to go home     OBJECTIVE  Physical Exam:  Vitals:    09/20/17 1650 09/20/17 1945 09/21/17 0130 09/21/17 0403   BP: 98/58 100/52 100/50 100/50   BP Location: Left arm Left arm Left arm Left arm   Pulse: 83 82 81 82   Resp: _0 Temp: 37.2 C (99 F) 36.9 C (98.4 F) 36.3 C (97.3 F) 36.2 C (97.2 F)   TempSrc: Temporal Temporal Temporal Temporal   SpO2: 99% 98% 97% 96%   Weight:       Height:           GEN: no apparent distress  HEENT: NCAT, face symmetric  CHEST: regular rate and rhythm, no m/r/g  LUNGS: Clear to auscultation bilaterally in all fields, nonlabored respirations  ABD: soft, nondistended, nontender, G tube in place, dressing is clean and dry  NEURO/MOTOR: alert, appropriate  EXTREMITIES: atraumatic  VASCULAR: feet wwp, no edema    Intake/Output:  07/19 0700 - 07/20 0659  In: 3005 [I.V.:949.7]  Out: 2475 [Urine:1450]     Medications:  Current Facility-Administered Medications   Medication    sodium chloride 0.9 % FLUSH REQUIRED IF PATIENT HAS IV    dextrose 5 % FLUSH REQUIRED IF PATIENT HAS IV    metoclopramide (REGLAN) injection 10 mg    sodium chloride 0.9 % flush 10 mL    sodium chloride 0.9 % flush 10 mL    miconazole (MICATIN) 2 % powder    TPN (2 in 1) Adult Continuous    thiamine (B-1) injection 948 mg    folic acid injection 1 mg    levothyroxine (SYNTHROID) 20 MCG/ML injection 75 mcg    HYDROmorphone (DILAUDID) injection 0.5 mg    LORazepam (ATIVAN) 2 mg/mL injection 0.5 mg    ampicillin (OMNIPEN) 500 mg in sodium chloride 0.9 % mini-bag PLUS    pantoprazole (PROTONIX) 4 mg/ml injection 40 mg    methadone (DOLOPHINE) 1.3 mg in sodium chloride 0.9 % IV syringe    ondansetron (ZOFRAN)  injection 4 mg    enoxaparin (LOVENOX) injection 40 mg         Laboratory values:   Recent Labs      09/21/17   0041  09/20/17   1309  09/19/17   2328  09/18/17   2330   WBC  7.4   --   9.2  7.0   Hemoglobin  7.2*  8.8*  7.7*  7.2*   Hematocrit  23*   --   24*  23*   Platelets  369   --   399*  425*   INR   --    --    --   1.2*     No components found with this basename: APTT, PT Recent Labs      09/21/17   0041  09/19/17   2328   Sodium  134  135   Potassium  3.7  3.8   Chloride  99  102   CO2  23  20   UN  6  8   Creatinine  0.56  0.56   Glucose  117*  114*   Calcium  8.2*  8.2*   Magnesium  1.9  1.6   Phosphorus  3.6  3.2    Recent Labs      09/21/17   0041  09/19/17   2328   AST  43*  45*   ALT  31  29   Alk Phos  511*  517*   Bilirubin,Total  0.2  0.3     Recent Labs      09/21/17   0041  09/19/17   2328   09/18/17   0956   Total Protein  6.4  6.4   < >  8.3*   Albumin  3.0*  3.0*   < >  4.0   Prealbumin   --   11*   --   15*    < > = values in this interval not displayed.     Recent Labs      09/18/17   1746   Amylase  95   Lipase  8*      GLUCOSE:   Recent Labs      09/21/17   0608  09/21/17   0036  09/20/17   1717   Glucose POCT  125*  77  116*       Imaging: No results found.     ASSESSMENT  Amanda Galloway is a 72 y.o. female with h/o whipple procedure with complicated/prolonged hospital stay for failure to thrive due to chronic nausea/vomiting, poor PO intake with suspected gastric outlet obstruction vs gastroparesis. Her IR placed PEG tube was removed 7/22.    PLAN   Diet low residue; gastrectomy portions   TPN (2 in 1) Adult Continuous (wean to stop today)   Antiemetics prn: zofran 56m   Home meds: synthroid 781m every day, lorazepam 0.57m757mhs   OOB/ambulate   DVT ppx: lovenox   Dispo: [facility]; hopefully home today    AabWilburn Corneliaurgery Resident; PGY1  [facility]

## 2017-09-25 NOTE — Telephone Encounter (Signed)
Script sent to pharmacy.

## 2017-09-25 NOTE — Progress Notes (Signed)
Assumed care of pt at 0700. VSS . Denies pain , up in room  Independently .    Pt  Tolerating small amounts  Of diet .  Voiding . Old peg site dressing changed . Leaking  Around insertion site .  Skin  Red .   Pt tolerated well.  Pt  Given extra dressing supplied .  PICC line removed by provider dressing  D/I.   Pt anxious to go home .  Pt will be d.\ c d  To daughters house  , accompanied by her son. Pt currently  Resting in chair awaiting  Transportation to home .

## 2017-09-25 NOTE — Consults (Signed)
Medical Nutrition Therapy Brief Note:    Diet advanced to low fiber/residue. Passed BM, passing flatus. TPN weaned yesterday. Hopeful for d/c today pending PO tolerance.     If unable to d/c and PN to be restarted please page writer to confirm PN order.     Ammie Ferrier [place], [place] [phone]

## 2017-09-25 NOTE — Discharge Summary (Signed)
Name: Amanda Galloway MRN: [id number] DOB: 04-11-1945     Admit Date: 09/18/2017   Date of Discharge: 09/25/2017     Patient was accepted for discharge to   Home or Self Care [1]           Discharge Attending Physician: Charolotte Eke, MD      Hospitalization Summary    CONCISE NARRATIVE:   Amanda Galloway is a 72 y.o. female with h/o whipple procedure with complicated/prolonged hospital stay for failure to thrive due to chronic nausea/vomiting, poor PO intake with suspected gastric outlet obstruction vs gastroparesis. She was placed on TPN for several days while inpatient as she needed to be NPO for various imaging studies. She had several imaging series with GI, which indicated her PEG tube was the cause of vomiting and discomfort. Her IR placed PEG tube was removed 7/22. She tolerated the procedure well with no immediate complications. Pain and nausea immediately resolved.    At the time of discharge she was ambulating independently, tolerating a regular diet, and her pain was well controlled with oral pain medication.     She was discharged home with plans to follow up with as an outpatient. Thank you for allowing [name] to care for your patient.                   OTHER IMAGING RESULTS:   1. Delayed transit of contrast in the stomach, indicating potentially function or mechanical obstruction of the gastrointestinal anastomosis. Recommend an endoscopy for further evaluation.             SIGNIFICANT MED CHANGES: None    CONSULTANT SERVICE     Gastroenterology     Nutrition             Signed: Dennard Nip, PA  On: 09/25/2017  at: 9:27 AM

## 2017-09-25 NOTE — Discharge Instructions (Signed)
Brief Summary of your Hospital Stay:   You were admitted to the hospital on 09/18/17 with chronic nausea and vomiting, poor oral intake with suspected gastric outlet obstruction. You underwent an      At the time of discharge you were ambulating independently, tolerating a regular diet, and your pain was well controlled with oral pain medications.     Call Dr. Ron Agee @ [phone] for:   Fever of 101F. or greater  Shaking chills  Nausea and / or vomiting  Diarrhea  Uncontrolled pain  Not able to have a BM  Not able to pass gas  Signs and symptoms of dehydration: dry mouth, dry skin; decreased frequency or volume of urination; fast heart rate > 100 beats per minute, feeling your heart beating in your chest; lightheadedness, dizziness or headache. Please drink 1200 ml of fluid daily to prevent dehydration.  If you cannot reach the provider above, call the doctor's answering service.    Activity:    Continue activity as tolerated.    Continue to be out of bed at least 8 hours a day.    You should be walking at least 4 times daily. Stairs are encouraged.   Continue doing your deep breathing exercises to prevent pneumonia.     Diet: regualr    GO OUT TO [employer] FOR DINNER TO CELEBRATE :)    Pain Management:   Resume home management.      Medications   A list of medication changes has been included in your discharge packet. These medications were reviewed prior to your discharge. Please call the surgery office with concerns or contact your primary care provider for instructions on medications you were taking prior to coming in the hospital.        Follow up Appointments:  You should follow up with Dr. Ron Agee in 2 weeks  You will need to follow up with your PCP within one week for continued monitoring and to discuss any dietary or medication changes that were made while you were in the hospital.

## 2017-09-26 ENCOUNTER — Telehealth: Payer: Self-pay | Admitting: Hematology and Oncology

## 2017-09-26 NOTE — Telephone Encounter (Signed)
Called and spoke with home care nurse that eventually pt will be on Dr Dagoberto Ligas service but at this time she is followed by surgical oncology. I have reached out to surgical NP and will find out when she should see Dr Jeannetta Nap.  Verbalized understanding and thanked [name] for the call back

## 2017-09-26 NOTE — Telephone Encounter (Signed)
LM that it looks like patient is under care of surgery right now. Has follow up with them. No follow ups for medical oncology at this time. Not at my desk to check Dr Freeman Caldron reassignment list but can check at a later time-if any questions after hearing message can call back.

## 2017-09-26 NOTE — Progress Notes (Signed)
09/26/17 1434   Post-Discharge Phone Call   Was 24 hour post-discharge phone call completed? Yes   Phone Call Comments Left a message and phone # in case of questions or concerns.

## 2017-09-27 NOTE — Progress Notes (Signed)
[facility]GI Surgery Follow up note    Chief Complaint  Amanda Galloway is a 72 y.o. female who presents  on 10/10/2017 for [facility]GI follow-up care.     Diagnosis  Locally advanced Pancreatic adenocarinoma    Physician Team  Surgeon: Delano Metz, MD  PCP: Celesta Aver, MD  Gastroenterologist: Fonnie Jarvis, MD  Medical Oncology: Lucretia Roers, MD ( previously Calton Dach)  Radiation Oncologist: Durene Romans, MD    Interval History  She presents today with her adoptive daughter since discharged from last admission.   Doing well with no pain. Happy that has been able to eat all kinds of food. Ate steak with no n/v.   Bowel movements are regular. Voiding without any difficulty. Weight is stable.   States is quite depressed due to still weakness and complications from surgery.   No fevers, chills, nausea, vomiting, diarrhea, constipation, abdominal pain or steatorrhea.   Daughter states patient not drinking enough despite no GI difficulties.   They both state she does not like drinking water on a regular basis. They are working on this.   Energy and weakness are improving but unable to sleep much at night. Getting 2 to 3 hours.    She also has some neuropathy that has slightly gotten worse since admission but Lyrica helps.     Oncologic History   Over several months, patient noted worsening abdominal pain and weight loss. Extensive work-up including EGD, colonoscopy and imaging were unrevealing. After suggestive HIDA scan, she underwent cholecystectomy on September 06, 2016 but symptoms persisted.      MRCP on September 25, 2016 demonstrated2x2cm soft tissue mass-like abnormality in celiac region, contiguous with medial margin of uncinate process of pancreas concerning for pancreatic lession vs lymphadenopathy.     EUS/FNA on September 27, 2016 demonstrated a mass in pancreas uncinate process and lymph node pressing celiac axis. FNA confirmed adenocarcinoma.     She was transferred to [facility] for additional management. Abd/pelvis  CT on September 30, 2016 revealed ill-defined prominent retroperitoneal soft tissue with central low attenuation suggestive of necrosis which abuts SMA and is adjacent to pancreatic uncinate process similar to prior MRI. Differential includes pancreatic neoplasm versus lymphadenopathy. CT angio on September 29, 2016 showed no pulmonary embolism. Nonspecific 3 mm nodules in right upper and middle lobe, recommend attention on follow-up imaging.      Diagnostic laparoscopy and port placement by Dr. Ron Agee on October 04, 2016 - no evidence of metastatic disease.     FOLFIRINOX chemotherapy - treatment start date 10/05/2016. Completed 8 cycles.     Neoadjuvant SBRT 25 Gy in 5 fractions pancreatic cancer, with concurrent boost to 30 Gy in 5 fractions to tumor around SMA from 02/13/17-03/08/17     She underwent a Whipple procedure with Dr. Delano Metz, MD on 03/29/17. She was briefly admitted to SICU post operatively for hemodynamic monitoring and the expectation of complicated pain management given patient on home methadone. 1/31 NGT placed for nausea/vomiting. TPN started 2/6. Multiple perc drains placed for fluid collections throughout her stay. On 04/16/17 patient had a PTC drain placed by IR and was transferred back to SICU for increased WOB and was intubated for worsening respiratory status. Chest tube placed 4/5 for left pleural effusion. 4/12 PEG tube placed for poor PO intake. 4/19 gastric perforation from PEG site. 6/7 downsizing of PEG d/t peristomal leakage. 6/13 positive for H. Pylori. Patient's hospital course was also been complicated by constipation, malnutrition, pain control, depression and fluid overload. She was  readmitted several times to SICU during her stay.  Palliative care followed patient throughout her hospital stay to assist with pain management, anxiety, depression. All of the patient's drains were removed prior to going home. Her PICC line was removed as well. She was discharged from the hospital on  08/24/17 to home.     She was readmitted from clinic on 09/18/17-09/25/17 for  failure to thrive due to chronic nausea/vomiting, poor PO intake with suspected gastric outlet obstruction vs gastroparesis. She was placed on TPN for several days while inpatient as she needed to be NPO for various imaging studies. She had several imaging series with GI, which indicated her PEG tube was the cause of vomiting and discomfort. Her IR placed PEG tube was removed 7/22. She tolerated the procedure well with no immediate complications. Pain and nausea immediately resolved.      Medical History  Past Medical History:   Diagnosis Date    Acute kidney failure 03/31/2017    Cancer     Depression     Fibromyalgia     Hypothyroidism      Surgical History  Past Surgical History:   Procedure Laterality Date    APPENDECTOMY      CHOLECYSTECTOMY      CHOLECYSTECTOMY, LAPAROSCOPIC  09/06/2016    HYSTERECTOMY  08/1986    Fibroids    KNEE SURGERY Right     PR INSERT TUNNELED CV CATH WITH PORT Right 10/04/2016    Procedure: Right IJ MEDIPORT Insertion;  Surgeon: Delano Metz, MD;  Location: [facility] MAIN OR;  Service: Oncology General    PR LAP,DIAGNOSTIC ABDOMEN N/A 10/04/2016    Procedure: LAPAROSCOPY DIAGNOSTIC;  Surgeon: Delano Metz, MD;  Location: [facility] MAIN OR;  Service: Oncology General    PR PART [place] PANC,PROX+REMV DUOD+ANAST N/A 03/29/2017    Procedure: WHIPPLE PROCEDURE;  Surgeon: Delano Metz, MD;  Location: [facility] MAIN OR;  Service: Oncology General    ROTATOR CUFF REPAIR Right     TONSILLECTOMY AND ADENOIDECTOMY       Family History  Family History   Problem Relation Age of Onset    Breast cancer Mother         died 48    Cancer Father         NHL, died 53    Heart Disease Father     Diabetes Sister     Obesity Sister         4 siblings     Social History  Social History     Social History    Marital status: Divorced     Spouse name: N/A    Number of children: N/A    Years of education: N/A     Occupational History    Not on  file.     Social History Main Topics    Smoking status: Never Smoker    Smokeless tobacco: Never Used    Alcohol use No    Drug use: No    Sexual activity: Not on file     Social History Narrative    No narrative on file       Medications  Outpatient Encounter Prescriptions as of 10/09/2017   Medication Sig Dispense Refill    pregabalin (LYRICA) 75 MG capsule Take 1 capsule (75 mg total) by mouth 2 times daily   Max daily dose: 150 mg 60 capsule 0    traZODone (DESYREL) 50 MG tablet Take 1 tablet (50 mg total) by mouth nightly 30 tablet  0    DULoxetine (CYMBALTA) 20 MG DR capsule Take 1 capsule (20 mg total) by mouth every evening 30 capsule 0    amoxicillin-clavulanate (AUGMENTIN) 875-125 MG per tablet Take 1 tablet by mouth 2 times daily 104 tablet 0    lactobacillus rhamnosus, GG, (CULTURELLE) capsule Take 1 capsule (1 each total) by mouth daily 30 capsule 0    docusate sodium (COLACE) 100 MG capsule Take 2 capsules (200 mg total) by mouth 2 times daily 120 capsule 5    levothyroxine (SYNTHROID, LEVOTHROID) 150 MCG tablet Take 1 tablet (150 mcg total) by mouth daily (before breakfast) 30 tablet 0    metoclopramide (REGLAN) 10 MG tablet Take 1 tablet (10 mg total) by mouth 4 times daily (before meals and nightly) 120 tablet 0    ondansetron (ZOFRAN-ODT) 4 MG disintegrating tablet Take 1 tablet (4 mg total) by mouth 3 times daily as needed (nausea)   Place on top of tongue. 30 tablet 0    pantoprazole (PROTONIX) 40 MG EC tablet Take 1 tablet (40 mg total) by mouth 2 times daily (before meals)   Swallow whole. Do not crush, break, or chew. 60 tablet 0    sodium chloride 1 GM tablet Take 1 tablet (1 g total) by mouth 3 times daily (after meals) 90 tablet 0    SUMAtriptan (IMITREX) 25 MG tablet Take 1 tablet (25 mg total) by mouth as needed for Migraine   Take at onset of headache. May repeat once in 2 hours. 9 tablet 1    rollator Use as directed. 1 each 0    Nutritional Supplements (PROSOURCE NO  CARB) LIQD liquid Take 30 mLs by mouth 2 times daily (with meals)      LORazepam (ATIVAN) 0.5 MG tablet Take 0.5 tablets (0.25 mg total) by mouth nightly as needed for Anxiety (insomnia)   Max daily dose: 0.25 mg 10 tablet 0    ferrous sulfate 325 (65 FE) MG tablet Take 1 tablet (325 mg total) by mouth daily (with breakfast) 100 tablet 0    [DISCONTINUED] DULoxetine (CYMBALTA) 20 MG DR capsule Take 1 capsule (20 mg total) by mouth every evening 30 capsule 0    [DISCONTINUED] pregabalin (LYRICA) 75 MG capsule Take 1 capsule (75 mg total) by mouth 2 times daily   Max daily dose: 150 mg 60 capsule 0    senna (SENOKOT) 8.6 MG tablet Take 2 tablets by mouth 2 times daily 120 tablet 5    PREMARIN 0.625 MG tablet Take 0.625 mg by mouth every morning         Vitamins - senior (CENTRUM SILVER) TABS Take 1 tablet by mouth daily (Patient taking differently: Take 1 tablet by mouth every morning   ) 30 tablet 0    [DISCONTINUED] melatonin tablet 3 mg       [DISCONTINUED] sodium chloride tablet 1 g       [DISCONTINUED] pantoprazole (PROTONIX) EC tablet 40 mg       [DISCONTINUED] metoclopramide (REGLAN) tablet 10 mg       [DISCONTINUED] thiamine tablet 100 mg       [DISCONTINUED] melatonin tablet 3 mg       [DISCONTINUED] SUMAtriptan (IMITREX) tablet 25 mg       [DISCONTINUED] sodium chloride 0.9 % FLUSH REQUIRED IF PATIENT HAS IV       [DISCONTINUED] dextrose 5 % FLUSH REQUIRED IF PATIENT HAS IV       [DISCONTINUED] ibuprofen (ADVIL,MOTRIN) tablet 600 mg       [  DISCONTINUED] promethazine (PHENERGAN) 6.25 mg in sodium chloride 0.9% 25 mL IVPB       [DISCONTINUED] acetaminophen (TYLENOL) tablet 1,000 mg       [DISCONTINUED] sodium chloride 0.9 % FLUSH REQUIRED IF PATIENT HAS IV       [DISCONTINUED] dextrose 5 % FLUSH REQUIRED IF PATIENT HAS IV       [DISCONTINUED] sodium chloride 0.9 % flush 10 mL       [DISCONTINUED] sodium chloride 0.9 % flush 10 mL       [DISCONTINUED] miconazole (MICATIN) 2 % powder        [DISCONTINUED] folic acid injection 1 mg       [DISCONTINUED] levothyroxine (SYNTHROID) 20 MCG/ML injection 75 mcg       [DISCONTINUED] ampicillin (OMNIPEN) 500 mg in sodium chloride 0.9 % mini-bag PLUS       [DISCONTINUED] ondansetron (ZOFRAN) injection 4 mg       [DISCONTINUED] enoxaparin (LOVENOX) injection 40 mg        No facility-administered encounter medications on file as of 10/09/2017.       Allergies  No Known Allergies (drug, envir, food or latex)  Review of Systems  Review of Systems   Constitutional: Positive for malaise/fatigue. Negative for chills, fever and weight loss.   HENT: Negative for hearing loss.    Eyes: Negative for blurred vision.   Respiratory: Negative for cough and shortness of breath.    Cardiovascular: Negative for chest pain, palpitations and leg swelling.   Gastrointestinal: Negative for abdominal pain, blood in stool, constipation, diarrhea, heartburn, nausea and vomiting.   Genitourinary: Negative for frequency.   Skin: Negative for rash.   Neurological: Positive for tingling and weakness.   Endo/Heme/Allergies: Does not bruise/bleed easily.   Psychiatric/Behavioral: Positive for depression. Negative for memory loss. The patient is nervous/anxious.      Physical Exam  BP 129/57    Pulse 99    Temp 36.4 C (97.6 F) (Temporal)    SpO2 99%   Physical Exam   Constitutional: She is oriented to person, place, and time. She appears well-developed and well-nourished. No distress.   HENT:   Head: Normocephalic.   Mouth/Throat: No oropharyngeal exudate.   Eyes: Pupils are equal, round, and reactive to light. Conjunctivae and EOM are normal. Right eye exhibits no discharge. Left eye exhibits no discharge. No scleral icterus.   Neck: Normal range of motion.   Cardiovascular: Normal rate, regular rhythm, normal heart sounds and intact distal pulses.  Exam reveals no gallop and no friction rub.    No murmur heard.  Pulmonary/Chest: Effort normal and breath sounds normal. No respiratory  distress. She has no wheezes. She has no rales.   Abdominal: Soft. Bowel sounds are normal. She exhibits no distension and no mass. There is no tenderness. There is no rebound and no guarding.       Musculoskeletal: She exhibits no edema.   Lymphadenopathy:     She has no cervical adenopathy.   Neurological: She is alert and oriented to person, place, and time.   Skin: Skin is warm and dry. No rash noted. She is not diaphoretic. No erythema. No pallor.   Psychiatric: She has a normal mood and affect. Her behavior is normal. Judgment and thought content normal.     Laboratory Data      Lab results: 09/25/17  0105   WBC 9.2       No components found with this basename:  HGB,  HCT,  PLT,  CEA,  CA19-9  Pathology  [id number]     FINAL DIAGNOSIS:   (A) Soft tissue, perineural tissue surrounding SMA, biopsy:   - Benign fibroadipose tissue.   - One lymph node, negative for carcinoma (0/1).     (B) Pancreas, Duodenum, Stomach, Pancreaticoduodenectomy (Whipple   resection):   - A small focus (0.2 cm) of high-grade pancreatic intraepithelial   neoplasia.     - No invasive carcinoma identified.     - Background pancreas with chronic pancreatitis and islet cell   hyperplasia.     - Margins negative for dysplasia or carcinoma.     - Stomach with mild inactive chronic gastritis; negative for H.   pylori on H&E.   - Duodenum with no significant pathologic abnormality.     - Twenty-one lymph nodes, negative for carcinoma (0/21).     - See comment.     (C) Pancreas, uncinate margin, resection:   - Cauterized pancreatic parenchyma and fibroadipose tissue; negative   for carcinoma.   - One lymph node, negative for carcinoma (0/1).     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   Imaging Data  No results found.     Assessment  Amanda Galloway is a 72 y.o.  old female with pancreatic cancer who  underwent pancreaticoduodenectomy after neoadjuvant chemotherapy. Her post operative course was complicated by intra-abdominal abscess, abdominal pain, gastrostomy tube induced gastric anastomotic dehiscence and prolonged hospitalization. She was readmitted for a gastric outlet obstruction due to PEG tube. This was removed during her admission.     She is recovering really well since PEG tube removal. She has been able to eat all her meals with no nausea or vomiting. She continues to have no pain but continues to be depressed despite improvement in her condition. We will continue to monitor her recovery. She will established care with PCP for her depression and worsening neuropathy.     Plan   Refill on long term medications, needs to establish care with PCP.    Trazodone for sleeping, dicussed proper sleep hygiene.    Dicussed fluid intake and ways to increase PO intake.    Polysporin to wound, change dressing daily and PRN.    Follow up in 3 weeks or sooner if needed.     Rodrigo Ran, NP

## 2017-10-01 ENCOUNTER — Encounter: Payer: Self-pay | Admitting: Surgical Oncology

## 2017-10-01 ENCOUNTER — Telehealth: Payer: Self-pay | Admitting: Surgery

## 2017-10-01 MED ORDER — AMOXICILLIN-POT CLAVULANATE 875-125 MG PO TABS *I*
1.0000 | ORAL_TABLET | Freq: Two times a day (BID) | ORAL | 0 refills | Status: DC
Start: 2017-10-01 — End: 2017-10-02

## 2017-10-01 NOTE — Telephone Encounter (Signed)
Amanda Galloway was discharged recently from [facility] . [name] wants to know if she is supposed to be continuing on Augmentin.

## 2017-10-01 NOTE — Telephone Encounter (Signed)
72 y.o. female s/p whipple 03/2017 with Dr. Ron Agee for pancreatic adenocarcinoma. Recent hospitalization from 7/17- 09/25/17 for failure to thrive- gastric outlet obstruction vs gastroparesis.     I left a message on [name]'s answering machine. Sorry to have missed patient's call. Returning based on request. MyChart message. Will also send follow up based on the questions. She may call back during business hours.

## 2017-10-02 ENCOUNTER — Telehealth: Payer: Self-pay | Admitting: Surgery

## 2017-10-02 MED ORDER — AMOXICILLIN-POT CLAVULANATE 875-125 MG PO TABS *I*
1.0000 | ORAL_TABLET | Freq: Two times a day (BID) | ORAL | 0 refills | Status: DC
Start: 2017-10-02 — End: 2017-11-08

## 2017-10-02 NOTE — Telephone Encounter (Signed)
Georgeanna's daughter, [name] called wanting a new order sent to have [facility] instead of [facility]

## 2017-10-02 NOTE — Telephone Encounter (Signed)
Spoke with [name] (patient's daughter)  Discharged one week ago with home PT w/ [facility]  No contact from Physical Therapist.    Referral made to [facility] for nursing care and  home Physical Therapy

## 2017-10-03 ENCOUNTER — Encounter: Payer: Self-pay | Admitting: Gastroenterology

## 2017-10-03 NOTE — Telephone Encounter (Signed)
Bonnita Nasuti called from [facility], patient's case is still open with [facility].  Contacted [facility] and closed the active case.

## 2017-10-07 LAB — EKG 12-LEAD
P: 1 deg
PR: 176 ms
QRS: 10 deg
QRSD: 70 ms
QT: 348 ms
QTc: 431 ms
Rate: 92 {beats}/min
T: 6 deg

## 2017-10-09 ENCOUNTER — Ambulatory Visit: Payer: Medicare (Managed Care) | Attending: General Surgery | Admitting: General Surgery

## 2017-10-09 VITALS — BP 129/57 | HR 99 | Temp 97.6°F

## 2017-10-09 DIAGNOSIS — C259 Malignant neoplasm of pancreas, unspecified: Secondary | ICD-10-CM

## 2017-10-09 MED ORDER — DULOXETINE HCL 20 MG PO CPEP *I*
20.0000 mg | DELAYED_RELEASE_CAPSULE | Freq: Every evening | ORAL | 0 refills | Status: DC
Start: 2017-10-09 — End: 2017-10-28

## 2017-10-09 MED ORDER — PREGABALIN 75 MG PO CAPS *A*
75.0000 mg | ORAL_CAPSULE | Freq: Two times a day (BID) | ORAL | 0 refills | Status: DC
Start: 2017-10-09 — End: 2017-11-06

## 2017-10-09 MED ORDER — TRAZODONE HCL 50 MG PO TABS *I*
50.0000 mg | ORAL_TABLET | Freq: Every evening | ORAL | 0 refills | Status: DC
Start: 2017-10-09 — End: 2017-11-06

## 2017-10-09 NOTE — Patient Instructions (Signed)
[facility]    [phone]    -Try trazodone  -

## 2017-10-18 ENCOUNTER — Telehealth: Payer: Self-pay | Admitting: General Surgery

## 2017-10-18 MED ORDER — SILVER NITRATE-POT NITRATE 75-25 % EX MISC *I*
CUTANEOUS | 0 refills | Status: DC
Start: 2017-10-18 — End: 2018-01-20

## 2017-10-18 NOTE — Telephone Encounter (Signed)
Spoke with nurse.   Wound healing well 2.5x2.3cm (Peg tube site)  Healthy tissue granulation, no s/s of infection.   Still with drainage from wound.   Silver nitrate to cauterize area to minimize drainage.    A/p:Script sent, 2 to 3 times per week. Call office if any issues.

## 2017-10-18 NOTE — Telephone Encounter (Signed)
Patient's wound is still draining and Santiago Glad wants to apply silver nitrate and a dry dressing to the wound. If this is okay, please send a script for silver nitrate to the [facility] pharmacy.

## 2017-10-21 ENCOUNTER — Telehealth: Payer: Self-pay | Admitting: Surgery

## 2017-10-21 NOTE — Telephone Encounter (Signed)
Please call back Vikki Ports as she is returning your phone call to discuss Joice

## 2017-10-21 NOTE — Telephone Encounter (Signed)
Spoke with Vikki Ports and she will reevaluate pateint Wednesday. Not symptomatic. Aware pressure low. Called and left message with patient as well to return call.     Per Dr. Juliane Lack continue Augmentin until 11/23/17 at lease.

## 2017-10-21 NOTE — Telephone Encounter (Signed)
Amanda Galloway called to report a reading of 120/46 today without signs/symptoms.  Patient complained of dizziness yesterday.

## 2017-10-21 NOTE — Telephone Encounter (Signed)
Amanda Galloway is a 72 y.o. female with h/o whipple procedure on 5/79/72 with complicated/prolonged hospital stay for failure to thrive due to chronic nausea/vomiting, poor PO intake with suspected gastric outlet obstruction vs gastroparesis Admitted 7/17-7/24. Last seen as outpatient 10/11/17.      She was placed on TPN for several days while inpatient as she needed to be NPO for various imaging studies. She had several imaging series with GI, which indicated her PEG tube was the cause of vomiting and discomfort. Her IR placed PEG tube was removed 7/22. She tolerated the procedure well with no immediate complications. Pain and nausea immediately resolved.    At time of last follow up PEG had been removed, eating meals without n/v. Depression continued but recovery improving. She was recommended to establish with PCP. She has apt in September. She was previously on Augmentin long term.    A/P: left message to call back/or return call later today.

## 2017-10-23 ENCOUNTER — Telehealth: Payer: Self-pay

## 2017-10-23 ENCOUNTER — Ambulatory Visit: Payer: Medicare (Managed Care) | Attending: Oncology | Admitting: Oncology

## 2017-10-23 VITALS — BP 108/58 | HR 64 | Temp 97.5°F | Resp 17 | Ht 67.01 in | Wt 136.9 lb

## 2017-10-23 DIAGNOSIS — C259 Malignant neoplasm of pancreas, unspecified: Secondary | ICD-10-CM

## 2017-10-23 NOTE — Telephone Encounter (Signed)
Pt and her daughter (who is an Therapist, sports ) met with Dr Jeannetta Nap today. They were discussing her wound care. Pt's daughter verbalized that she thought that it would be beneficial for pt to use Allevyn. Pt wound care managed by Dr Ron Agee and Doreene Nest, NP. Sent a message to [facility] regarding pt's daughter's thoughts about Allevyn dressing and she replied that pt could try this.  Called to pt to let her know can try the Allevyn (I had sent her home with 2 sizes of Allevy with instructions not to use until I could discuss with surgical team). She can let Dr Ron Agee know if this is any better than current dressings when she see Dr Ron Agee next week.  Verbalized understanding

## 2017-10-24 NOTE — Progress Notes (Addendum)
MEDICAL ONCOLOGY PROGRESS NOTE    Reason for Evaluation: Follow-up    Diagnosis: Pancreatic adenocarcinoma    Stage: Locally advanced    Performance Status: ECOG PS 1-2    Diagnosis and Treatment History:  1. Over several months, patient noted worsening abdominal pain and weight loss.  Extensive work-up including EGD, colonoscopy and imaging were unrevealing.  After suggestive HIDA scan, she underwent cholecystectomy on September 06, 2016 but symptoms persisted.      2. MRCP on September 25, 2016 demonstrated 2x2cm soft tissue mass-like abnormality in celiac region, contiguous with medial margin of uncinate process of pancreas concerning for pancreatic lession vs lymphadenopathy.    3. EUS/FNA on September 27, 2016 demonstrated a mass in pancreas uncinate process and lymph node pressing celiac axis. FNA confirmed adenocarcinoma.    4. She was transferred to [facility] for additional management.  Abd/pelvis CT on September 30, 2016 revealed ill-defined prominent retroperitoneal soft tissue with central low attenuation suggestive of necrosis which abuts SMA and is adjacent to pancreatic uncinate process similar to prior MRI. Differential includes pancreatic neoplasm versus lymphadenopathy.  CT angio on September 29, 2016 showed no pulmonary embolism.  Nonspecific 3 mm nodules in right upper and middle lobe, recommend attention on follow-up imaging.      5. Diagnostic laparoscopy and port placement by Amanda Galloway on October 04, 2016 - no evidence of metastatic disease.    6.Neoadjuvant SBRT 25 Gy in 5 fractions pancreatic cancer, with concurrent boost to 30 Gy in 5 fractions to tumor around SMA from 02/13/17-03/08/17    7. She underwent a Whipple procedure with Dr. Delano Metz, MD on 03/29/17.  Her postoperative course was complicated by intra-abdominal abscess, abdominal pain, gastrostomy tube induced gastric anastomotic dehiscence , and  prolonged hospitalization (with multiple readmissions)      Treatment Hx: FOLFIRINOX chemotherapy - Cycle 1 (in-house)  on October 05, 2016.  Cycle 2 on October 19, 2016.  Cycle 3 on November 02, 2016.  Cycle 4 on November 16, 2016.  Cycle 5 on November 27, 2016. Cycle 6 on December 14, 2016.  Cycle 7 on December 28, 2016. Cycle 8 on January 11, 2017.    Interval History:   She came today for follow up accompanied by her daughter. The last time the Amanda Galloway clinic was seen was in November 2018. Since then, she has undernet:    Neoadjuvant SBRT 25 Gy in 5 fractions pancreatic cancer, with a concurrent boost to 30 Gy in 5 fractions to tumor around SMA from 02/13/17-03/08/17  She underwent a Whipple procedure with Dr. Delano Metz, MD on 03/29/17.    Her postoperative course was complicated by prolonged hospitalization/readmission secondary to an intra-abdominal abscess, abdominal pain, gastrostomy tube induced gastric anastomotic dehiscence, and malnutrition required intermittent TPN.    The last admission was in July 2019 for a G-tube inducing gastric outlet obstruction/pain. Since then, she has been recovering very well and trying to get back to her previous functional status. She has been eating, no dysphagia, no significant change in bowel habits, no SOB at rest, no chest pain. However, she has experienced G-tube wound dehiscence/oozing, causing erythema/inflammation to surrounding skin. Amanda Galloway team was managing the wound and was started on Silver Nitrate yesterday.    According to the patient and her daughter, the Silver Nitrate made it worse, and they were wounding if they could get Allevyn.      Past Medical/Surgical History:  1. Fibromyalgia  2. Hypothyroidism  3. S/p knee  surgery  4. S/p hysterectomy  5. S/p shoulder surgery  6. S/p tonsillectomy    Current Medications:  Current Outpatient Prescriptions on File Prior to Visit   Medication Sig Dispense Refill    silver nitrate applicators 84-16 % applicator Apply topically three times a week 100 each 0    pregabalin (LYRICA) 75 MG capsule Take 1 capsule (75 mg total) by  mouth 2 times daily   Max daily dose: 150 mg 60 capsule 0    traZODone (DESYREL) 50 MG tablet Take 1 tablet (50 mg total) by mouth nightly 30 tablet 0    DULoxetine (CYMBALTA) 20 MG DR capsule Take 1 capsule (20 mg total) by mouth every evening 30 capsule 0    amoxicillin-clavulanate (AUGMENTIN) 875-125 MG per tablet Take 1 tablet by mouth 2 times daily 104 tablet 0    lactobacillus rhamnosus, GG, (CULTURELLE) capsule Take 1 capsule (1 each total) by mouth daily 30 capsule 0    docusate sodium (COLACE) 100 MG capsule Take 2 capsules (200 mg total) by mouth 2 times daily 120 capsule 5    levothyroxine (SYNTHROID, LEVOTHROID) 150 MCG tablet Take 1 tablet (150 mcg total) by mouth daily (before breakfast) 30 tablet 0    metoclopramide (REGLAN) 10 MG tablet Take 1 tablet (10 mg total) by mouth 4 times daily (before meals and nightly) 120 tablet 0    ondansetron (ZOFRAN-ODT) 4 MG disintegrating tablet Take 1 tablet (4 mg total) by mouth 3 times daily as needed (nausea)   Place on top of tongue. 30 tablet 0    pantoprazole (PROTONIX) 40 MG EC tablet Take 1 tablet (40 mg total) by mouth 2 times daily (before meals)   Swallow whole. Do not crush, break, or chew. 60 tablet 0    sodium chloride 1 GM tablet Take 1 tablet (1 g total) by mouth 3 times daily (after meals) 90 tablet 0    SUMAtriptan (IMITREX) 25 MG tablet Take 1 tablet (25 mg total) by mouth as needed for Migraine   Take at onset of headache. May repeat once in 2 hours. 9 tablet 1    rollator Use as directed. 1 each 0    Nutritional Supplements (PROSOURCE NO CARB) LIQD liquid Take 30 mLs by mouth 2 times daily (with meals)      LORazepam (ATIVAN) 0.5 MG tablet Take 0.5 tablets (0.25 mg total) by mouth nightly as needed for Anxiety (insomnia)   Max daily dose: 0.25 mg 10 tablet 0    ferrous sulfate 325 (65 FE) MG tablet Take 1 tablet (325 mg total) by mouth daily (with breakfast) 100 tablet 0    senna (SENOKOT) 8.6 MG tablet Take 2 tablets by mouth 2  times daily 120 tablet 5    PREMARIN 0.625 MG tablet Take 0.625 mg by mouth every morning         Vitamins - senior (CENTRUM SILVER) TABS Take 1 tablet by mouth daily (Patient taking differently: Take 1 tablet by mouth every morning   ) 30 tablet 0     No current facility-administered medications on file prior to visit.        Social History: Divorced.  Worked in [employer].  Daughter Amanda Galloway) worked on [employer] and now in home care.    Family History: Mother with breast cancer.  Father with cancer of unknown type.    Review of Systems: As per Interval History, remainder of 12-point review of systems otherwise negative    Physical  Exam:  Vitals:    10/23/17 0848   BP: 108/58   Pulse: 64   Resp: 17   Temp: 36.4 C (97.5 F)   Weight: 62.1 kg (136 lb 14.5 oz)   Height: 170.2 cm (5' 7.01")     GEN: Well appearing , NAD  HEENT: PERRL, MMM  CVS: S2, S2, RRR  PULM: CTABL  EXT: no edema BL  CNS: A& A x4. non focal   ABD: soft, no tenderness to palpation, no organomegaly, + mid abdomen surgical scar, G-tube wound/opening covered with wound dressing sounded by ~ cm erythema and oozing yellow/gray fluid.           Assessment and Plan:       [name] with locally advanced pancreatic adenocarcinoma that was treated neoadjuvant FOLFIRINOX (with Neulasta support).    After she completed Cycle 8 of FOLFIRINOX on 01/11/17, the restaging scans show stable disease. Hence, she underwent Neoadjuvant SBRT from 02/13/17-03/08/17 and Whipple procedure with Dr. Delano Metz, MD on 03/29/17. However, the postoperative course was complicated by prolonged hospitalization and multiple readmissions (Jan-July 2019) secondary to abscesses, gastric outlet obstruction, pain, malnutrition, and wound dehiscence.    She was discharged on 09/25/2017. Since then, she is recovering very well. However, the only ongoing issue is the wound dehiscence (managed by Amanda Galloway team)    The final pathology of the Whipple was only remarkable for a  small focus (0.2 cm) of high-grade pancreatic intraepithelial neoplasia. There was no lymph node involvement or positive margins. The last scan was 08/2017: no evidence of new lesions or cancer recurrence. Hence, we recommend surveillance; will have a clinical visit and restaging scans in 6 months.    Regarding the wound dehiscence, will check with the wound care team and Amanda Galloway team regarding the use of Allevyn and follow up with Amanda Galloway on Monday.         Pt seen and discussed with Amanda Galloway, [place]  Fellow, Hematology/Oncology  [facility]. [place].    I have seen an examined the patient and agree with the above outlined findings and plans as discussed with the fellow and the patient.    Amanda Galloway

## 2017-10-28 ENCOUNTER — Ambulatory Visit: Payer: Medicare (Managed Care) | Admitting: Surgical Oncology

## 2017-10-28 ENCOUNTER — Encounter: Payer: Self-pay | Admitting: Surgical Oncology

## 2017-10-28 VITALS — BP 118/55 | HR 79 | Temp 98.1°F | Resp 16 | Ht 67.01 in | Wt 139.1 lb

## 2017-10-28 DIAGNOSIS — C259 Malignant neoplasm of pancreas, unspecified: Secondary | ICD-10-CM

## 2017-10-28 DIAGNOSIS — T148XXA Other injury of unspecified body region, initial encounter: Secondary | ICD-10-CM

## 2017-10-28 MED ORDER — DULOXETINE HCL 20 MG PO CPEP *I*
40.0000 mg | DELAYED_RELEASE_CAPSULE | Freq: Every evening | ORAL | 0 refills | Status: DC
Start: 2017-10-28 — End: 2017-11-18

## 2017-10-28 MED ORDER — PREMARIN 0.625 MG PO TABS
0.6250 mg | ORAL_TABLET | Freq: Every morning | ORAL | 0 refills | Status: DC
Start: 2017-10-28 — End: 2017-11-18

## 2017-10-28 MED ORDER — NYSTATIN 100000 UNIT/GM EX POWD  *I*
Freq: Two times a day (BID) | CUTANEOUS | 0 refills | Status: DC
Start: 2017-10-28 — End: 2018-01-20

## 2017-10-28 MED ORDER — LEVOTHYROXINE SODIUM 150 MCG PO TABS *I*
150.0000 ug | ORAL_TABLET | Freq: Every day | ORAL | 0 refills | Status: DC
Start: 2017-10-28 — End: 2017-11-18

## 2017-10-28 NOTE — Progress Notes (Signed)
[facility]-GI Surgery Follow up note    Chief Complaint  Amanda Galloway is a 72 y.o. female who presents  on 10/28/2017 for [facility]B-GI follow-up care.     Diagnosis  Locally advanced Pancreatic adenocarinoma    Physician Team  Surgeon:  Delano Metz, MD  PCP:  Celesta Aver, MD  Gastroenterologist: Fonnie Jarvis, MD  Medical Oncology: Calton Dach, MD   Radiation Oncologist: Durene Romans, MD    Interval History  Neck, shoulder and bilateral arms still painful. Getting home PT and OT.  Using Tramadol prn   Still draining at old PEG site. Surrounding skin with erythema and irritation   CHN have been treating site with silver nitrate every 3 days, covering  with Allevyn (change Q 3 days)  Appetite and energy level are improving. She has gained 3 lbs. Walking daily.     New PCP - 1st visit scheduled 11/18/2017.  Needs refills on routine medications until then     Oncologic History   Over several months, patient noted worsening abdominal pain and weight loss. Extensive work-up including EGD, colonoscopy and imaging were unrevealing. After suggestive HIDA scan, she underwent cholecystectomy on September 06, 2016 but symptoms persisted.      MRCP on September 25, 2016 demonstrated2x2cm soft tissue mass-like abnormality in celiac region, contiguous with medial margin of uncinate process of pancreas concerning for pancreatic lession vs lymphadenopathy.     EUS/FNA on September 27, 2016 demonstrated a mass in pancreas uncinate process and lymph node pressing celiac axis. FNA confirmed adenocarcinoma.     She was transferred to [facility] for additional management. Abd/pelvis CT on September 30, 2016 revealed ill-defined prominent retroperitoneal soft tissue with central low attenuation suggestive of necrosis which abuts SMA and is adjacent to pancreatic uncinate process similar to prior MRI. Differential includes pancreatic neoplasm versus lymphadenopathy. CT angio on September 29, 2016 showed no pulmonary embolism. Nonspecific 3 mm nodules in right  upper and middle lobe, recommend attention on follow-up imaging.      Diagnostic laparoscopy and port placement by Dr. Ron Agee on October 04, 2016 - no evidence of metastatic disease.     FOLFIRINOX chemotherapy - treatment start date 10/05/2016. Completed 8 cycles.     Neoadjuvant SBRT 25 Gy in 5 fractions pancreatic cancer, with concurrent boost to 30 Gy in 5 fractions to tumor around SMA from 02/13/17-03/08/17     She underwent a Whipple procedure with Dr. Delano Metz, MD on 03/29/17.  She was briefly admitted to SICU post operatively for hemodynamic monitoring and the expectation of complicated pain management given patient on home methadone. 1/31 NGT placed for nausea/vomiting. TPN started 2/6. Multiple perc drains placed for fluid collections throughout her stay. On 04/16/17 patient had a PTC drain placed by IR and was transferred back to SICU for increased WOB and was intubated for worsening respiratory status. Chest tube placed 4/5 for left pleural effusion. 4/12 PEG tube placed for poor PO intake. 4/19 gastric perforation from PEG site. 6/7 downsizing of PEG d/t peristomal leakage. 6/13 positive for H. Pylori. Patient's hospital course was also been complicated by constipation, malnutrition, pain control, depression and fluid overload. She was readmitted several times to SICU during her stay.  Palliative care followed patient throughout her hospital stay to assist with pain management, anxiety, depression. All of the patient's drains were removed prior to going home. Her PICC line was removed as well. She was discharged from the hospital on 08/24/17 to home.      She was readmitted  from clinic on 09/18/17-09/25/17 for  failure to thrive due to chronic nausea/vomiting, poor PO intake with suspected gastric outlet obstruction vs gastroparesis. She was placed on TPN for several days while inpatient as she needed to be NPO for various imaging studies. She had several imaging series with GI, which indicated her PEG  tube was the cause of vomiting and discomfort. Her IR placed PEG tube was removed 7/22. She tolerated the procedure well with no immediate complications. Pain and nausea immediately resolved.      Medical History  Past Medical History:   Diagnosis Date    Acute kidney failure 03/31/2017    Cancer     Depression     Fibromyalgia     Hypothyroidism      Surgical History  Past Surgical History:   Procedure Laterality Date    APPENDECTOMY      CHOLECYSTECTOMY      CHOLECYSTECTOMY, LAPAROSCOPIC  09/06/2016    HYSTERECTOMY  08/1986    Fibroids    KNEE SURGERY Right     PR INSERT TUNNELED CV CATH WITH PORT Right 10/04/2016    Procedure: Right IJ MEDIPORT Insertion;  Surgeon: Delano Metz, MD;  Location: [facility] MAIN OR;  Service: Oncology General    PR LAP,DIAGNOSTIC ABDOMEN N/A 10/04/2016    Procedure: LAPAROSCOPY DIAGNOSTIC;  Surgeon: Delano Metz, MD;  Location: [facility] MAIN OR;  Service: Oncology General    PR PART [place] PANC,PROX+REMV DUOD+ANAST N/A 03/29/2017    Procedure: WHIPPLE PROCEDURE;  Surgeon: Delano Metz, MD;  Location: [facility] MAIN OR;  Service: Oncology General    ROTATOR CUFF REPAIR Right     TONSILLECTOMY AND ADENOIDECTOMY       Family History  Family History   Problem Relation Age of Onset    Breast cancer Mother         died 68    Cancer Father         NHL, died 74    Heart Disease Father     Diabetes Sister     Obesity Sister         4 siblings     Social History  Social History     Social History    Marital status: Divorced     Spouse name: N/A    Number of children: N/A    Years of education: N/A     Occupational History    Not on file.     Social History Main Topics    Smoking status: Never Smoker    Smokeless tobacco: Never Used    Alcohol use No    Drug use: No    Sexual activity: Not on file     Social History Narrative    No narrative on file       Medications  Outpatient Encounter Prescriptions as of 10/28/2017   Medication Sig Dispense Refill    silver nitrate applicators 16-10 %  applicator Apply topically three times a week 100 each 0    pregabalin (LYRICA) 75 MG capsule Take 1 capsule (75 mg total) by mouth 2 times daily   Max daily dose: 150 mg 60 capsule 0    traZODone (DESYREL) 50 MG tablet Take 1 tablet (50 mg total) by mouth nightly 30 tablet 0    DULoxetine (CYMBALTA) 20 MG DR capsule Take 1 capsule (20 mg total) by mouth every evening 30 capsule 0    amoxicillin-clavulanate (AUGMENTIN) 875-125 MG per tablet Take 1 tablet by mouth 2 times daily 104  tablet 0    docusate sodium (COLACE) 100 MG capsule Take 2 capsules (200 mg total) by mouth 2 times daily 120 capsule 5    levothyroxine (SYNTHROID, LEVOTHROID) 150 MCG tablet Take 1 tablet (150 mcg total) by mouth daily (before breakfast) 30 tablet 0    metoclopramide (REGLAN) 10 MG tablet Take 1 tablet (10 mg total) by mouth 4 times daily (before meals and nightly) 120 tablet 0    ondansetron (ZOFRAN-ODT) 4 MG disintegrating tablet Take 1 tablet (4 mg total) by mouth 3 times daily as needed (nausea)   Place on top of tongue. 30 tablet 0    pantoprazole (PROTONIX) 40 MG EC tablet Take 1 tablet (40 mg total) by mouth 2 times daily (before meals)   Swallow whole. Do not crush, break, or chew. 60 tablet 0    SUMAtriptan (IMITREX) 25 MG tablet Take 1 tablet (25 mg total) by mouth as needed for Migraine   Take at onset of headache. May repeat once in 2 hours. 9 tablet 1    rollator Use as directed. 1 each 0    Nutritional Supplements (PROSOURCE NO CARB) LIQD liquid Take 30 mLs by mouth 2 times daily (with meals)      LORazepam (ATIVAN) 0.5 MG tablet Take 0.5 tablets (0.25 mg total) by mouth nightly as needed for Anxiety (insomnia)   Max daily dose: 0.25 mg 10 tablet 0    ferrous sulfate 325 (65 FE) MG tablet Take 1 tablet (325 mg total) by mouth daily (with breakfast) 100 tablet 0    senna (SENOKOT) 8.6 MG tablet Take 2 tablets by mouth 2 times daily 120 tablet 5    PREMARIN 0.625 MG tablet Take 0.625 mg by mouth every morning          Vitamins - senior (CENTRUM SILVER) TABS Take 1 tablet by mouth daily (Patient taking differently: Take 1 tablet by mouth every morning   ) 30 tablet 0     No facility-administered encounter medications on file as of 10/28/2017.       Allergies  No Known Allergies (drug, envir, food or latex)     Review of Systems  Review of Systems   Constitutional: Negative for chills, fever, malaise/fatigue and weight loss.   HENT: Negative for congestion and sore throat.    Eyes: Negative for blurred vision and redness.   Respiratory: Negative for cough, hemoptysis, sputum production and shortness of breath.    Cardiovascular: Negative for chest pain, palpitations and leg swelling.   Gastrointestinal: Negative for abdominal pain, blood in stool, constipation, diarrhea, heartburn, melena, nausea and vomiting.   Genitourinary: Negative for dysuria, flank pain and frequency.   Musculoskeletal: Negative for back pain, joint pain and myalgias.   Skin: Negative for itching and rash.   Neurological: Negative for dizziness, focal weakness and seizures.   Psychiatric/Behavioral: Negative for depression.     Physical Exam  BP 118/55 (BP Location: Left arm, Patient Position: Sitting, Cuff Size: adult)    Pulse 79    Temp 36.7 C (98.1 F) (Temporal)    Resp 16    Ht 170.2 cm (5' 7.01")    Wt 63.1 kg (139 lb 1.8 oz)    SpO2 98%    BMI 21.78 kg/m      Physical Exam   Constitutional: She is oriented to person, place, and time. She appears well-developed and well-nourished. No distress.   HENT:   Head: Normocephalic.   Mouth/Throat: No oropharyngeal exudate.  Eyes: Pupils are equal, round, and reactive to light. Conjunctivae and EOM are normal. Right eye exhibits no discharge. Left eye exhibits no discharge. No scleral icterus.   Neck: Normal range of motion.   Cardiovascular: Normal rate, regular rhythm, normal heart sounds and intact distal pulses.  Exam reveals no gallop and no friction rub.    No murmur heard.  Pulmonary/Chest:  Effort normal and breath sounds normal. No respiratory distress. She has no wheezes. She has no rales.   Abdominal: Soft. Bowel sounds are normal. She exhibits no distension and no mass. There is no tenderness. There is no rebound and no guarding.       Musculoskeletal: She exhibits no edema.   Lymphadenopathy:     She has no cervical adenopathy.   Neurological: She is alert and oriented to person, place, and time.   Skin: Skin is warm and dry. No rash noted. She is not diaphoretic. No erythema. No pallor.   Psychiatric: She has a normal mood and affect. Her behavior is normal. Judgment and thought content normal.     Laboratory Data    Chemistry        Lab results: 09/25/17  0105   Sodium 124*   Potassium 4.1   Chloride 88*   CO2 22   GFR,Caucasian 99   GFR,Black 114   UN 8   Creatinine 0.48*        Lab results: 09/25/17  0105   Glucose 106*   Calcium 8.5*   Total Protein 6.8   Albumin 3.2*   ALT 34   AST 49*   Alk Phos 494*   Bilirubin,Total 0.4        Pathology  [id number]     FINAL DIAGNOSIS:   (A) Soft tissue, perineural tissue surrounding SMA, biopsy:   - Benign fibroadipose tissue.   - One lymph node, negative for carcinoma (0/1).     (B) Pancreas, Duodenum, Stomach, Pancreaticoduodenectomy (Whipple   resection):   - A small focus (0.2 cm) of high-grade pancreatic intraepithelial   neoplasia.     - No invasive carcinoma identified.     - Background pancreas with chronic pancreatitis and islet cell   hyperplasia.     - Margins negative for dysplasia or carcinoma.     - Stomach with mild inactive chronic gastritis; negative for H.   pylori on H&E.   - Duodenum with no significant pathologic abnormality.     - Twenty-one lymph nodes, negative for carcinoma (0/21).     - See comment.     (C) Pancreas, uncinate margin, resection:   - Cauterized pancreatic parenchyma and fibroadipose tissue; negative   for carcinoma.   - One lymph node, negative for carcinoma (0/1).     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     Imaging Data  None recent     Assessment  Amanda Galloway is a 72 y.o. female with a history of malignant neoplasm of the pancreas who underwent a Whipple procedure with Dr. Delano Metz, MD on 03/29/17.  She had a complicated hospital stay which required multiple drains, multiple readmissions to the ICU, TPN, a PEG tube, and multiple scans. She was discharged on 08/24/17.  She is doing extremely well from a clinical standpoint.  She has drainage from the gastrostomy tube site which has been removed with a large patch of irritated skin.  Plan   Silver nitrate to hypergranulation  tissue at old PEG site per Dr. Ron Agee today.  Patient tolerated well.    Local wound care to site including skin protective ointment and nystatin powder.  Change dressing daily and prn    Referral to wound clinic for recommendations    Discontinue metoclopramide.  Levothyroxine and Premarin refilled.  Duloxetine dose increased to 40 mg.    CT abd/pelvis pancreatic protocol for cancer surveillance and evaluation for possible gastrocutaneous fistula   Check labs including CBC, CMP, CA 19-9 and prealbumin   Continue OT for shoulder/neck stiffness.  Discussed massage therapy   Follow up 2 weeks     Arty Baumgartner, NP    Seen and agree.

## 2017-10-28 NOTE — Patient Instructions (Signed)
Discontinue metoclopramide

## 2017-10-29 ENCOUNTER — Telehealth: Payer: Self-pay | Admitting: Surgery

## 2017-10-29 NOTE — Telephone Encounter (Signed)
Wants to discuss that Amanda Galloway took Dilaudid tablet/4mg  this am but it is not on her medication list. Also wants to extend her O.T. Visits due to shoulder pain

## 2017-10-29 NOTE — Telephone Encounter (Signed)
Chart reviewed and discussed yesterday's care with Joclyn.   Will call Thursday to check in.  OK to take OTC pain medication.

## 2017-10-30 ENCOUNTER — Telehealth: Payer: Self-pay | Admitting: Surgery

## 2017-10-30 NOTE — Telephone Encounter (Signed)
Returned call.  Left message

## 2017-10-30 NOTE — Telephone Encounter (Signed)
Amanda Galloway has a few questions regarding home care orders.

## 2017-10-31 ENCOUNTER — Telehealth: Payer: Self-pay | Admitting: Surgery

## 2017-10-31 ENCOUNTER — Other Ambulatory Visit
Admission: RE | Admit: 2017-10-31 | Discharge: 2017-10-31 | Disposition: A | Discharge status: Home / Self Care | Payer: Medicare (Managed Care) | Source: Ambulatory Visit | Attending: Surgery | Admitting: Surgery

## 2017-10-31 DIAGNOSIS — C259 Malignant neoplasm of pancreas, unspecified: Secondary | ICD-10-CM | POA: Insufficient documentation

## 2017-10-31 LAB — CBC AND DIFFERENTIAL
Baso # K/uL: 0.1 10*3/uL (ref 0.0–0.1)
Basophil %: 1.1 %
Eos # K/uL: 0.3 10*3/uL (ref 0.0–0.4)
Eosinophil %: 4.8 %
Hematocrit: 32 % — ABNORMAL LOW (ref 34–45)
Hemoglobin: 9.3 g/dL — ABNORMAL LOW (ref 11.2–15.7)
IMM Granulocytes #: 0 10*3/uL
IMM Granulocytes: 0.2 %
Lymph # K/uL: 1.5 10*3/uL (ref 1.2–3.7)
Lymphocyte %: 26.8 %
MCH: 28 pg/cell (ref 26–32)
MCHC: 30 g/dL — ABNORMAL LOW (ref 32–36)
MCV: 95 fL (ref 79–95)
Mono # K/uL: 0.5 10*3/uL (ref 0.2–0.9)
Monocyte %: 8.8 %
Neut # K/uL: 3.3 10*3/uL (ref 1.6–6.1)
Nucl RBC # K/uL: 0 10*3/uL (ref 0.0–0.0)
Nucl RBC %: 0 /100 WBC (ref 0.0–0.2)
Platelets: 343 10*3/uL (ref 160–370)
RBC: 3.3 MIL/uL — ABNORMAL LOW (ref 3.9–5.2)
RDW: 17.5 % — ABNORMAL HIGH (ref 11.7–14.4)
Seg Neut %: 58.3 %
WBC: 5.6 10*3/uL (ref 4.0–10.0)

## 2017-10-31 LAB — COMPREHENSIVE METABOLIC PANEL
ALT: 42 U/L — ABNORMAL HIGH (ref 0–35)
AST: 49 U/L — ABNORMAL HIGH (ref 0–35)
Albumin: 4 g/dL (ref 3.5–5.2)
Alk Phos: 552 U/L — ABNORMAL HIGH (ref 35–105)
Anion Gap: 14 (ref 7–16)
Bilirubin,Total: <0.2 mg/dL (ref 0.0–1.2)
CO2: 23 mmol/L (ref 20–28)
Calcium: 8.8 mg/dL (ref 8.6–10.2)
Chloride: 108 mmol/L (ref 96–108)
Creatinine: 0.86 mg/dL (ref 0.51–0.95)
GFR,Black: 78 *
GFR,Caucasian: 68 *
Glucose: 96 mg/dL (ref 60–99)
Lab: 17 mg/dL (ref 6–20)
Potassium: 4.5 mmol/L (ref 3.3–5.1)
Sodium: 145 mmol/L (ref 133–145)
Total Protein: 7.4 g/dL (ref 6.3–7.7)

## 2017-10-31 LAB — PREALBUMIN: Prealbumin: 19 mg/dL — ABNORMAL LOW (ref 20–40)

## 2017-10-31 LAB — NEUTROPHIL #-INSTRUMENT: Neutrophil #-Instrument: 3.3 10*3/uL

## 2017-10-31 NOTE — Telephone Encounter (Signed)
Amanda Galloway is a 72 y.o. female with a history of malignant neoplasm of the pancreas who underwent a Whipple procedure with Dr. Delano Metz, MD on 03/29/17. She had a complicated hospital stay of 4 months which required multiple drains, multiple readmissions to the ICU, TPN, a PEG tube, and multiple scans. She was discharged on 08/24/17. No longer with PEG or on TPN.        Spoke with patient- transferred to office as patient returned my call.     She is no longer taking Dilaudid for neck pain (not since 2 days ago). She may take tylenol/motrin for pain. She agrees.     Episode of near syncope happened yesterday while raising from a chair. She admits to poor intake of fluids and getting up quickly. She was instructed to hydrate, get up from seated position with assistance, go to Urgent Care or ED should this happen again. She will call her PCP's office to let them know of her symptoms.  I will send message to PCP as well.   I will have her get labs that were ordered on last visit 10/28/17.   She has a follow up with [name] in 1.5 weeks.   She knows to call in the mean time should she have any further questions or concerns.

## 2017-10-31 NOTE — Telephone Encounter (Signed)
Discussed case with Dr. Davonna Belling.  She made availability for her team to do an acute assessment on patient.  Patient declined appointment.

## 2017-10-31 NOTE — Telephone Encounter (Signed)
Discussed findings with Waunita Schooner. No orthostatic changes. Completed entire session of PT without difficulty today. Not drinking enough water. Recommended 6-8 oz every hour.     A/P also recommended urgent evaluation should she have near syncopal episode again.    Attempted to call patient, left message on answering machine to return call.

## 2017-10-31 NOTE — Telephone Encounter (Signed)
Amanda Galloway just completed a PT visit with the patient and called to report a low blood pressure of 90/50. Patient mentioned she felt lightheaded last night but is okay today.

## 2017-11-01 LAB — CA 19 9 (EFF. 01-2011): CA 19 9 (eff. 01-2011): 77 U/mL — ABNORMAL HIGH (ref 0–35)

## 2017-11-03 ENCOUNTER — Encounter: Payer: Self-pay | Admitting: Surgical Oncology

## 2017-11-05 ENCOUNTER — Telehealth: Payer: Self-pay | Admitting: Surgery

## 2017-11-05 NOTE — Telephone Encounter (Signed)
Shanon Brow called from [facility] to report that Amanda Galloway is still having low blood pressure readings (82/50)

## 2017-11-05 NOTE — Telephone Encounter (Signed)
Patient instructed to follow up with PCP or emergent care if symptomatic. Left this on David's voice mail. He may call back if questions. Asked to pass along this information I already discussed with the patient again.

## 2017-11-06 MED ORDER — TRAZODONE HCL 50 MG PO TABS *I*
50.0000 mg | ORAL_TABLET | Freq: Every evening | ORAL | 0 refills | Status: DC
Start: 2017-11-06 — End: 2017-11-18

## 2017-11-06 MED ORDER — PREGABALIN 75 MG PO CAPS *A*
75.0000 mg | ORAL_CAPSULE | Freq: Two times a day (BID) | ORAL | 0 refills | Status: DC
Start: 2017-11-06 — End: 2017-11-18

## 2017-11-06 MED ORDER — PANTOPRAZOLE SODIUM 40 MG PO TBEC *I*
40.0000 mg | DELAYED_RELEASE_TABLET | Freq: Two times a day (BID) | ORAL | 0 refills | Status: DC
Start: 2017-11-06 — End: 2017-11-18

## 2017-11-07 ENCOUNTER — Ambulatory Visit: Payer: Medicare (Managed Care) | Admitting: Family Medicine

## 2017-11-08 ENCOUNTER — Ambulatory Visit: Payer: Medicare (Managed Care) | Attending: Family Medicine | Admitting: Family Medicine

## 2017-11-08 ENCOUNTER — Other Ambulatory Visit
Admission: RE | Admit: 2017-11-08 | Discharge: 2017-11-08 | Disposition: A | Discharge status: Home / Self Care | Payer: Medicare (Managed Care) | Source: Ambulatory Visit | Attending: Family Medicine | Admitting: Family Medicine

## 2017-11-08 ENCOUNTER — Ambulatory Visit
Admission: RE | Admit: 2017-11-08 | Discharge: 2017-11-08 | Disposition: A | Discharge status: Home / Self Care | Payer: Medicare (Managed Care) | Source: Ambulatory Visit | Attending: Surgery | Admitting: Surgery

## 2017-11-08 ENCOUNTER — Encounter: Payer: Self-pay | Admitting: Family Medicine

## 2017-11-08 VITALS — BP 86/50 | HR 63 | Temp 98.1°F | Ht 67.01 in | Wt 143.0 lb

## 2017-11-08 DIAGNOSIS — E039 Hypothyroidism, unspecified: Secondary | ICD-10-CM

## 2017-11-08 DIAGNOSIS — M7500 Adhesive capsulitis of unspecified shoulder: Secondary | ICD-10-CM

## 2017-11-08 DIAGNOSIS — R238 Other skin changes: Secondary | ICD-10-CM | POA: Insufficient documentation

## 2017-11-08 DIAGNOSIS — M542 Cervicalgia: Secondary | ICD-10-CM

## 2017-11-08 DIAGNOSIS — C259 Malignant neoplasm of pancreas, unspecified: Secondary | ICD-10-CM

## 2017-11-08 DIAGNOSIS — R42 Dizziness and giddiness: Secondary | ICD-10-CM

## 2017-11-08 LAB — T4, FREE: Free T4: 1.1 ng/dL (ref 0.9–1.7)

## 2017-11-08 LAB — TSH: TSH: 1.39 u[IU]/mL (ref 0.27–4.20)

## 2017-11-08 MED ORDER — STERILE WATER FOR IRRIGATION IR SOLN *I*
900.0000 mL | Freq: Once | Status: AC
Start: 2017-11-08 — End: 2017-11-08
  Administered 2017-11-08: 450 mL via ORAL

## 2017-11-08 MED ORDER — IOHEXOL 350 MG/ML (OMNIPAQUE) IV SOLN *I*
1.0000 mL | Freq: Once | INTRAVENOUS | Status: AC
Start: 2017-11-08 — End: 2017-11-08
  Administered 2017-11-08: 98 mL via INTRAVENOUS

## 2017-11-08 NOTE — Progress Notes (Signed)
[facility]B-GI Surgery Follow up note    Chief Complaint  Amanda Galloway is a 72 y.o. female who presents  on 11/11/2017 for HPB-GI follow-up care.     Diagnosis  Locally advanced Pancreatic adenocarinoma    Physician Team  Surgeon:  Delano Metz, MD  PCP:  Celesta Aver, MD  Gastroenterologist: Fonnie Jarvis, MD  Medical Oncology: Calton Dach, MD   Radiation Oncologist: Durene Romans, MD    Interval History  She presents today with her supportive wife.   Had several low BPs. She has dizziness if changes position quickly.   She follow up with PCP last week who checked thyroid levels. These where normal.  Eating and drinking okay. Bowel movements are regular. No fevers or chills.   Weight is stable. Fatigue is improving. Continues to have neck pain.   Seen by the wound center.Started on Clobetasol, desitin, and cloroprep for wound care.     Oncologic History   Over several months, patient noted worsening abdominal pain and weight loss. Extensive work-up including EGD, colonoscopy and imaging were unrevealing. After suggestive HIDA scan, she underwent cholecystectomy on September 06, 2016 but symptoms persisted.      MRCP on September 25, 2016 demonstrated2x2cm soft tissue mass-like abnormality in celiac region, contiguous with medial margin of uncinate process of pancreas concerning for pancreatic lession vs lymphadenopathy.     EUS/FNA on September 27, 2016 demonstrated a mass in pancreas uncinate process and lymph node pressing celiac axis. FNA confirmed adenocarcinoma.     She was transferred to [facility] for additional management. Abd/pelvis CT on September 30, 2016 revealed ill-defined prominent retroperitoneal soft tissue with central low attenuation suggestive of necrosis which abuts SMA and is adjacent to pancreatic uncinate process similar to prior MRI. Differential includes pancreatic neoplasm versus lymphadenopathy. CT angio on September 29, 2016 showed no pulmonary embolism. Nonspecific 3 mm nodules in right upper and middle  lobe, recommend attention on follow-up imaging.      Diagnostic laparoscopy and port placement by Dr. Ron Agee on October 04, 2016 - no evidence of metastatic disease.     FOLFIRINOX chemotherapy - treatment start date 10/05/2016. Completed 8 cycles.     Neoadjuvant SBRT 25 Gy in 5 fractions pancreatic cancer, with concurrent boost to 30 Gy in 5 fractions to tumor around SMA from 02/13/17-03/08/17     She underwent a Whipple procedure with Dr. Delano Metz, MD on 03/29/17.  She was briefly admitted to SICU post operatively for hemodynamic monitoring and the expectation of complicated pain management given patient on home methadone. 1/31 NGT placed for nausea/vomiting. TPN started 2/6. Multiple perc drains placed for fluid collections throughout her stay. On 04/16/17 patient had a PTC drain placed by IR and was transferred back to SICU for increased WOB and was intubated for worsening respiratory status. Chest tube placed 4/5 for left pleural effusion. 4/12 PEG tube placed for poor PO intake. 4/19 gastric perforation from PEG site. 6/7 downsizing of PEG d/t peristomal leakage. 6/13 positive for H. Pylori. Patient's hospital course was also been complicated by constipation, malnutrition, pain control, depression and fluid overload. She was readmitted several times to SICU during her stay.  Palliative care followed patient throughout her hospital stay to assist with pain management, anxiety, depression. All of the patient's drains were removed prior to going home. Her PICC line was removed as well. She was discharged from the hospital on 08/24/17 to home.      She was readmitted from clinic on 09/18/17-09/25/17 for  failure  to thrive due to chronic nausea/vomiting, poor PO intake with suspected gastric outlet obstruction vs gastroparesis. She was placed on TPN for several days while inpatient as she needed to be NPO for various imaging studies. She had several imaging series with GI, which indicated her PEG tube was the cause  of vomiting and discomfort. Her IR placed PEG tube was removed 7/22. She tolerated the procedure well with no immediate complications. Pain and nausea immediately resolved.      Medical History  Past Medical History:   Diagnosis Date    Acute kidney failure 03/31/2017    Cancer     Depression     Fibromyalgia     Hypothyroidism      Surgical History  Past Surgical History:   Procedure Laterality Date    APPENDECTOMY      CERVICAL FUSION      CHOLECYSTECTOMY      CHOLECYSTECTOMY, LAPAROSCOPIC  09/06/2016    HYSTERECTOMY  08/1986    Fibroids    KNEE SURGERY Right     PR INSERT TUNNELED CV CATH WITH PORT Right 10/04/2016    Procedure: Right IJ MEDIPORT Insertion;  Surgeon: Delano Metz, MD;  Location[facility] MAIN OR;  Service: Oncology General    PR LAP,DIAGNOSTIC ABDOMEN N/A 10/04/2016    Procedure: LAPAROSCOPY DIAGNOSTIC;  Surgeon: Delano Metz, MD;  Location: [facility] MAIN OR;  Service: Oncology General    PR PART [place] PANC,PROX+REMV DUOD+ANAST N/A 03/29/2017    Procedure: WHIPPLE PROCEDURE;  Surgeon: Delano Metz, MD;  Location: [facility] MAIN OR;  Service: Oncology General    ROTATOR CUFF REPAIR Right     TONSILLECTOMY AND ADENOIDECTOMY       Family History  Family History   Problem Relation Age of Onset    Breast cancer Mother         died 78    Cancer Father         NHL, died 38    Heart Disease Father     Diabetes Sister     Obesity Sister         4 siblings    No Known Problems Daughter      Social History  Social History     Social History    Marital status: Divorced     Spouse name: N/A    Number of children: N/A    Years of education: N/A     Occupational History    Not on file.     Social History Main Topics    Smoking status: Never Smoker    Smokeless tobacco: Never Used    Alcohol use No    Drug use: No    Sexual activity: No     Social History Narrative    No narrative on file       Medications  Outpatient Encounter Prescriptions as of 11/11/2017   Medication Sig Dispense Refill    clobetasol  (TEMOVATE) 0.05 % cream Apply topically 2 times daily   To abdomen rash 45 g 0    Red Yeast Rice Extract (RED YEAST RICE PO) Take by mouth      pantoprazole (PROTONIX) 40 MG EC tablet Take 1 tablet (40 mg total) by mouth 2 times daily (before meals)   Swallow whole. Do not crush, break, or chew. 60 tablet 0    pregabalin (LYRICA) 75 MG capsule Take 1 capsule (75 mg total) by mouth 2 times daily   Max daily dose: 150 mg 60 capsule 0  traZODone (DESYREL) 50 MG tablet Take 1 tablet (50 mg total) by mouth nightly 30 tablet 0    levothyroxine (SYNTHROID, LEVOTHROID) 150 MCG tablet Take 1 tablet (150 mcg total) by mouth daily (before breakfast) 30 tablet 0    PREMARIN 0.625 MG tablet Take 1 tablet (0.625 mg total) by mouth every morning 30 tablet 0    DULoxetine (CYMBALTA) 20 MG DR capsule Take 2 capsules (40 mg total) by mouth every evening 60 capsule 0    nystatin (NYSTATIN) powder Apply topically 2 times daily 15 g 0    silver nitrate applicators 14-43 % applicator Apply topically three times a week 100 each 0    docusate sodium (COLACE) 100 MG capsule Take 2 capsules (200 mg total) by mouth 2 times daily 120 capsule 5    ondansetron (ZOFRAN-ODT) 4 MG disintegrating tablet Take 1 tablet (4 mg total) by mouth 3 times daily as needed (nausea)   Place on top of tongue. 30 tablet 0    SUMAtriptan (IMITREX) 25 MG tablet Take 1 tablet (25 mg total) by mouth as needed for Migraine   Take at onset of headache. May repeat once in 2 hours. 9 tablet 1    rollator Use as directed. 1 each 0    Nutritional Supplements (PROSOURCE NO CARB) LIQD liquid Take 30 mLs by mouth 2 times daily (with meals)      ferrous sulfate 325 (65 FE) MG tablet Take 1 tablet (325 mg total) by mouth daily (with breakfast) 100 tablet 0    senna (SENOKOT) 8.6 MG tablet Take 2 tablets by mouth 2 times daily 120 tablet 5    Vitamins - senior (CENTRUM SILVER) TABS Take 1 tablet by mouth daily (Patient taking differently: Take 1 tablet by  mouth every morning   ) 30 tablet 0     No facility-administered encounter medications on file as of 11/11/2017.       Allergies  No Known Allergies (drug, envir, food or latex)     Review of Systems  Review of Systems   Constitutional: Positive for malaise/fatigue. Negative for chills, fever and weight loss.   HENT: Negative for congestion and sore throat.    Eyes: Negative for blurred vision and redness.   Respiratory: Negative for cough, hemoptysis, sputum production and shortness of breath.    Cardiovascular: Negative for chest pain, palpitations and leg swelling.   Gastrointestinal: Negative for abdominal pain, blood in stool, constipation, diarrhea, heartburn, melena, nausea and vomiting.   Genitourinary: Negative for dysuria, flank pain and frequency.   Musculoskeletal: Negative for back pain, joint pain and myalgias.   Skin: Negative for itching and rash.   Neurological: Negative for dizziness, focal weakness and seizures.   Psychiatric/Behavioral: Negative for depression. The patient is nervous/anxious.      Physical Exam  BP 117/56    Pulse 72    Temp 36.2 C (97.2 F) (Temporal)    Resp 18    Ht 170.2 cm (5' 7.01")    Wt 65.2 kg (143 lb 13.6 oz)    SpO2 98%    BMI 22.52 kg/m      Physical Exam   Constitutional: She is oriented to person, place, and time. She appears well-developed and well-nourished. No distress.   HENT:   Head: Normocephalic.   Mouth/Throat: No oropharyngeal exudate.   Eyes: Pupils are equal, round, and reactive to light. Conjunctivae and EOM are normal. Right eye exhibits no discharge. Left eye exhibits no discharge. No scleral  icterus.   Neck: Normal range of motion.   Cardiovascular: Normal rate, regular rhythm, normal heart sounds and intact distal pulses.  Exam reveals no gallop and no friction rub.    No murmur heard.  Pulmonary/Chest: Effort normal and breath sounds normal. No respiratory distress. She has no wheezes. She has no rales.   Abdominal: Soft. Bowel sounds are normal.  She exhibits no distension and no mass. There is no tenderness. There is no rebound and no guarding.       Musculoskeletal: She exhibits no edema.   Lymphadenopathy:     She has no cervical adenopathy.   Neurological: She is alert and oriented to person, place, and time.   Skin: Skin is warm and dry. No rash noted. She is not diaphoretic. No erythema. No pallor.   Psychiatric: She has a normal mood and affect. Her behavior is normal. Judgment and thought content normal.     Laboratory Data    Chemistry        Lab results: 10/31/17  1435   Sodium 145   Potassium 4.5   Chloride 108   CO2 23   GFR,Caucasian 68   GFR,Black 78   UN 17   Creatinine 0.86        Lab results: 10/31/17  1435   Glucose 96   Calcium 8.8   Total Protein 7.4   Albumin 4.0   ALT 42*   AST 49*   Alk Phos 552*   Bilirubin,Total <0.2        Pathology  [id number]     FINAL DIAGNOSIS:   (A) Soft tissue, perineural tissue surrounding SMA, biopsy:   - Benign fibroadipose tissue.   - One lymph node, negative for carcinoma (0/1).     (B) Pancreas, Duodenum, Stomach, Pancreaticoduodenectomy (Whipple   resection):   - A small focus (0.2 cm) of high-grade pancreatic intraepithelial   neoplasia.     - No invasive carcinoma identified.     - Background pancreas with chronic pancreatitis and islet cell   hyperplasia.     - Margins negative for dysplasia or carcinoma.     - Stomach with mild inactive chronic gastritis; negative for H.   pylori on H&E.   - Duodenum with no significant pathologic abnormality.     - Twenty-one lymph nodes, negative for carcinoma (0/21).     - See comment.     (C) Pancreas, uncinate margin, resection:   - Cauterized pancreatic parenchyma and fibroadipose tissue; negative   for carcinoma.   - One lymph node, negative for carcinoma (0/1).     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      Imaging Data  CT abdomen and pelvis without and with contrast   11/11/2017   1. Interval resolution of intrahepatic fluid collection. The previously visualized subcapsular/perihepatic fluid collection along the lateral margin of the right hepatic lobe is similar to before but in a slightly different distribution. No new subcapsular or perihepatic fluid collections. 2. Skin retraction and skin thickening prior G-tube tract site of left upper abdomen. No definitive evidence of enterocutaneous fistulous tract at this site. 3. Status post Whipple procedure. Expected pneumobilia. The remaining pancreas is atrophic. 4. Interval resolution of bilateral pleural effusions and associated compressive atelectasis.      Assessment  Amanda Galloway is a 72 y.o. female with a history of malignant neoplasm of the  pancreas who underwent a Whipple procedure with Dr. Delano Metz, MD on 03/29/17.  She had a complicated hospital stay which required multiple drains, multiple readmissions to the ICU, TPN, a PEG tube, and multiple scans. She was discharged on 08/24/17.     She is doing extremely well from a clinical standpoint.  She has drainage from the gastrostomy tube site which has been removed with a large patch of irritated skin. Her most recent CT showed no evidence of recurrence.     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 She will continue wound care along the opening of the PEG tube. We will have her follow up with Dr. Foye Spurling for possible closure of of the fistulae endoscopically.  We will also obtain CT chest to complete  her surveillance, and will follow up in 3 months.     Plan   Follow uo with PCP for BP and home meds.    Increase fluid intake.    Continue wound care.    Follow up in 3 months or sooner if needed.     Rodrigo Ran, NP

## 2017-11-08 NOTE — Progress Notes (Signed)
CC:  Mult c/o.   S:  1. Lightheaded.  Hx of low BPs.  Does not drink well. Dark urine.  Does like salt.  2. Pancreatic cancer. No pain.  Diagnosed in January or February.  Still being followed closely.  No V. May have a fistula.  Appetite returning.  Went through chemo and surgery.   3.  Hypothyroidism. Using med daily.   Tired, but many reasons. Takes at least 1 hour before eats.   4. Shoulder and neck pain. Present for about 2-3 months.  Did have a neck pain previously due to a prior spinal fusion but has been worse for the past 2 to 3 months.  Was in bed during chemo for months. Hx fused vertebra neck.  No Numb, tingling , weakness into hands. L hand numbness and tingling. Feels like asleep.   Soc:  No cigs, no alc. No drugs.  Here with daughter.  Daughter is an oncology nurse at [employer].  When patient diagnosed with pancreatic cancer daughter brought her from where she lived in [place] and moved her up here.    Ros:   No fevers, no cough, no V.  Good appetite.  Denies significant depression at this time although her life has been totally changed by her cancer diagnosis and the loss of her job and moving to a new city and living with her daughter.    O:   BP (!) 86/50    Pulse 63    Temp 36.7 C (98.1 F) (Temporal)    Ht 1.702 m (5' 7.01")    Wt 64.9 kg (143 lb)    BMI 22.39 kg/m   does not move neck much and keeps shoulders/arms very still.  TMs without erythema or bulge.  Sclera clear.  Oral cavity without significant lesions.  Neck with reduced range of motion in all directions.  No point tenderness posterior neck.  Pulm: breath sounds equal bilat, no rales, no rhonchi, no wheezes, no rub, nl appearing chest expansion.      Cardiac: RRR, nl S1and S2, no M, no G, no C, no R, no Thrill, nl PMI, 2+ carotids equal bilat, 2+ radial pulses equal bilat.    Abdomen: multiple scars.  Has addressing left upper quadrant.  No point tenderness on abdominal exam.  Legs without edema.  Both shoulders with reduced  adduction.  Motor strength intact.  Radial pulses in tact.    A/P:  1. Ligh headed/low BP.  Sounds like chronic problem but slightly worse now.  Reviewed recent bloodwork which was not concerning.  Could be due to thyroid disease so will check that.  Increase free water until her urine is almost clear.  Increase salt intake.    2. Pancreatic cancer.  Supportive counseling.  Plugged in with oncology but this could be cause of other problems she is having.  For now will continue with oncology and surgery.  3. Hypothyroidism.  Dose was adjusted while in hospital because TSH elevated.  Get free T4 and TSH.  4. Cervical spinal fusion.  Continue home exercise program.  Left hand numbness may be due to nerve involvement but could also be due to side effects from chemotherapy.  For now holding off on advanced imaging or EMG.  5. Bilateral shoulder pain. Probable adhesive capsulitis.  Continue home exercise program and physical therapy.  Educated about other exercise to try that she is not yet been shown.  Followup:  has appointment with new CCP in 10 days.  If  not doing well keep that appointment.  Rito Ehrlich, MD, Dictated with Dragon [phone]

## 2017-11-10 ENCOUNTER — Encounter: Payer: Self-pay | Admitting: Surgery

## 2017-11-10 NOTE — Progress Notes (Signed)
CC:     Chief Complaint   Patient presents with    New Patient Visit       Identifiers:  2 Patient identification verification occurred.  Barriers to learning:  None    HPI:  Amanda Galloway is a 72 y.o. year old female is referred by Pruett, Vick Frees, MD  and Dr. Ron Agee for consultation/ evaluation and treatment recommendations of her Gtube site s/p Whipple 03/2017 for pancreatic cancer.   Her tube was removed on 7/22.  She c/o redness, itching/ burning of the left upper abdomen and left breast.  The dressing is getting saturated 1-2 times per day.  She has witnessed food particles coming out of it in the last week, but drainage as a whole has been decreasing.  Silver nitrate has been used a couple of times  Current routine for care is water only.    She is accompanied by her daughter [name] today who works at [employer]       ROS:   Otherwise well, other than the above stated in the HPI.    Past Medical/Surgical History:   Past Medical History:   Diagnosis Date    Acute kidney failure 03/31/2017    Cancer     Depression     Fibromyalgia     Hypothyroidism      Past Surgical History:   Procedure Laterality Date    APPENDECTOMY      CERVICAL FUSION      CHOLECYSTECTOMY      CHOLECYSTECTOMY, LAPAROSCOPIC  09/06/2016    HYSTERECTOMY  08/1986    Fibroids    KNEE SURGERY Right     PR INSERT TUNNELED CV CATH WITH PORT Right 10/04/2016    Procedure: Right IJ MEDIPORT Insertion;  Surgeon: Delano Metz, MD;  Location: [facility] MAIN OR;  Service: Oncology General    PR LAP,DIAGNOSTIC ABDOMEN N/A 10/04/2016    Procedure: LAPAROSCOPY DIAGNOSTIC;  Surgeon: Delano Metz, MD;  Location: [facility] MAIN OR;  Service: Oncology General    PR PART [place] PANC,PROX+REMV DUOD+ANAST N/A 03/29/2017    Procedure: WHIPPLE PROCEDURE;  Surgeon: Delano Metz, MD;  Location: [facility] MAIN OR;  Service: Oncology General    ROTATOR CUFF REPAIR Right     TONSILLECTOMY AND ADENOIDECTOMY       Family/Social History:    Family History   Problem Relation Age of Onset     Breast cancer Mother         died 28    Cancer Father         NHL, died 8    Heart Disease Father     Diabetes Sister     Obesity Sister         4 siblings    No Known Problems Daughter      Social History     Social History    Marital status: Divorced     Spouse name: N/A    Number of children: N/A    Years of education: N/A     Occupational History    Not on file.     Social History Main Topics    Smoking status: Never Smoker    Smokeless tobacco: Never Used    Alcohol use No    Drug use: No    Sexual activity: No     Social History Narrative    No narrative on file          Medications:  Current Outpatient Prescriptions   Medication  Red Yeast Rice Extract (RED YEAST RICE PO)    pantoprazole (PROTONIX) 40 MG EC tablet    pregabalin (LYRICA) 75 MG capsule    traZODone (DESYREL) 50 MG tablet    levothyroxine (SYNTHROID, LEVOTHROID) 150 MCG tablet    PREMARIN 0.625 MG tablet    DULoxetine (CYMBALTA) 20 MG DR capsule    nystatin (NYSTATIN) powder    silver nitrate applicators 32-35 % applicator    docusate sodium (COLACE) 100 MG capsule    ondansetron (ZOFRAN-ODT) 4 MG disintegrating tablet    SUMAtriptan (IMITREX) 25 MG tablet    rollator    Nutritional Supplements (PROSOURCE NO CARB) LIQD liquid    ferrous sulfate 325 (65 FE) MG tablet    senna (SENOKOT) 8.6 MG tablet    Vitamins - senior (CENTRUM SILVER) TABS    clobetasol (TEMOVATE) 0.05 % cream     No current facility-administered medications for this visit.      Physical Exam:    The patient is alert, well-nourished, well-developed, no acute distress   All of the following were examined and were within normal limits, except as noted:     BP 97/50 (BP Location: Left arm, Patient Position: Sitting, Cuff Size: adult)    Pulse 71    Temp 35.9 C (96.6 F) (Temporal)    Ht 1.702 m (5\' 7" )    Wt 64.4 kg (142 lb)    BMI 22.24 kg/m   General fine motor mobility of hands: WNL  Abdomen:  Non distended, slightly tender LUQ on  palpation,    Gastric Stoma:  Located on the left upper mid quadrant, is beefy red and measures 0.3cm in diameter,  Tan purulent effluent noted  Parastomal:   []  Clean, dry and intact  []  Ulcers:  []  Erythema:  []  Erosion:  [x]  Erythematous Plaques per photo, with crusting noted, weepy skin                 Assessment/Plan:  Encounter Diagnoses   Name Primary?    Contact dermatitis Yes    Gastric fistula            1.  Dermatitis d/t caustic effluent, mechanical stripping  Start topical clobetasol 0.05% twice daily after washing twice daily  Skin protectant as per AVS  Skin prep for taped areas  2.  Gastric fistula- closing up.  I reassured the patient and daughter that a dressing changed twice daily with proper skin protection should be sufficient  If we need to pouch it in the future we will  I did insert a silver nitrate stick but was unable to penetrate more than 0.3 cm.  Pt tolerated this well.    3.  Counseling and education for knowledge deficit:  Extensive education was provided today for 15 minutes (>50% in counseling/ education/ coordination of care) including:  [x]   Underlying pathophysiology of the patients diagnosis   [x]   Description of terminology of medications/ diagnosis/ routine  [x]   Treatments/ medications and potential side effects used to treat the stoma and surrounding skin.  [x]   Hygiene and care of the skin/ showering  [x]   Goals for wear time, changing instructions, diet/nutrition, quality of life  [x]   The patient verbalized understanding after asking appropriate questions.      See the AVS for patient instructions.    F/u prn    Patient Abbreviation Key     AVS After visit summary   LAR Low Anterior Resection   LOMN Letter of Medical  Necessity   MCJ Mucocutaneous Junction   MF Mucous Fistula   NT Non Tender   OS Opening of stoma   PRN As Needed   SBO Small Bowel Obstruction   WNL Within Normal Limits

## 2017-11-11 ENCOUNTER — Ambulatory Visit: Payer: Medicare (Managed Care) | Attending: Surgery | Admitting: Surgery

## 2017-11-11 ENCOUNTER — Ambulatory Visit: Payer: Medicare (Managed Care) | Attending: General Surgery | Admitting: General Surgery

## 2017-11-11 ENCOUNTER — Encounter: Payer: Self-pay | Admitting: Family Medicine

## 2017-11-11 ENCOUNTER — Encounter: Payer: Self-pay | Admitting: Surgery

## 2017-11-11 VITALS — BP 97/50 | HR 71 | Temp 96.6°F | Ht 67.0 in | Wt 142.0 lb

## 2017-11-11 VITALS — BP 117/56 | HR 72 | Temp 97.2°F | Resp 18 | Ht 67.01 in | Wt 143.8 lb

## 2017-11-11 DIAGNOSIS — C259 Malignant neoplasm of pancreas, unspecified: Secondary | ICD-10-CM

## 2017-11-11 DIAGNOSIS — K316 Fistula of stomach and duodenum: Secondary | ICD-10-CM

## 2017-11-11 DIAGNOSIS — L259 Unspecified contact dermatitis, unspecified cause: Secondary | ICD-10-CM

## 2017-11-11 MED ORDER — CLOBETASOL PROPIONATE 0.05 % EX CREA *I*
TOPICAL_CREAM | Freq: Two times a day (BID) | CUTANEOUS | 0 refills | Status: DC
Start: 2017-11-11 — End: 2018-01-20

## 2017-11-11 NOTE — Patient Instructions (Signed)
Remove dressing daily with adhesive remover  Wash   Dry  Apply clobetasol  Rub in well, wipe off excess  Apply skin skin prep to tape areas  And calmoseptine/ desitin/ balmex  Gauze  Tape

## 2017-11-14 ENCOUNTER — Encounter: Payer: Self-pay | Admitting: Gastroenterology

## 2017-11-14 ENCOUNTER — Telehealth: Payer: Self-pay | Admitting: General Surgery

## 2017-11-14 NOTE — Telephone Encounter (Signed)
Please call Amanda Galloway (O.T.)back at [phone] regarding Yitzel is continues to complain of lightheadedness and neck pain.

## 2017-11-14 NOTE — Telephone Encounter (Signed)
Spoke with OT provider.   Patient with similar symptoms on last visit.   Seen by PCP (see recommendations)  She will follow up with PCP regarding these issues.   In the interim, increase fluid intake, change positions slowly and see chiropractor.

## 2017-11-18 ENCOUNTER — Ambulatory Visit: Payer: Medicare (Managed Care) | Attending: Internal Medicine | Admitting: Internal Medicine

## 2017-11-18 ENCOUNTER — Encounter: Payer: Self-pay | Admitting: Internal Medicine

## 2017-11-18 VITALS — BP 115/59 | HR 69 | Temp 96.3°F | Ht 66.0 in | Wt 140.0 lb

## 2017-11-18 DIAGNOSIS — F329 Major depressive disorder, single episode, unspecified: Secondary | ICD-10-CM

## 2017-11-18 DIAGNOSIS — C259 Malignant neoplasm of pancreas, unspecified: Secondary | ICD-10-CM

## 2017-11-18 DIAGNOSIS — G620 Drug-induced polyneuropathy: Secondary | ICD-10-CM

## 2017-11-18 DIAGNOSIS — Z23 Encounter for immunization: Secondary | ICD-10-CM

## 2017-11-18 DIAGNOSIS — T451X5A Adverse effect of antineoplastic and immunosuppressive drugs, initial encounter: Secondary | ICD-10-CM

## 2017-11-18 MED ORDER — PREGABALIN 75 MG PO CAPS *A*
75.0000 mg | ORAL_CAPSULE | Freq: Two times a day (BID) | ORAL | 1 refills | Status: DC
Start: 2017-11-18 — End: 2018-01-20

## 2017-11-18 MED ORDER — DULOXETINE HCL 20 MG PO CPEP *I*
40.0000 mg | DELAYED_RELEASE_CAPSULE | Freq: Every evening | ORAL | 6 refills | Status: DC
Start: 2017-11-18 — End: 2018-12-04

## 2017-11-18 MED ORDER — LEVOTHYROXINE SODIUM 150 MCG PO TABS *I*
150.0000 ug | ORAL_TABLET | Freq: Every day | ORAL | 6 refills | Status: DC
Start: 2017-11-18 — End: 2018-12-16

## 2017-11-18 MED ORDER — PREMARIN 0.625 MG PO TABS
0.6250 mg | ORAL_TABLET | Freq: Every morning | ORAL | 3 refills | Status: DC
Start: 2017-11-18 — End: 2018-11-07

## 2017-11-18 MED ORDER — FERROUS SULFATE 325 (65 FE) MG PO TABS *WRAPPED* *I*
325.0000 mg | ORAL_TABLET | Freq: Every day | ORAL | 3 refills | Status: DC
Start: 2017-11-18 — End: 2018-09-11

## 2017-11-18 MED ORDER — PANTOPRAZOLE SODIUM 40 MG PO TBEC *I*
40.0000 mg | DELAYED_RELEASE_TABLET | Freq: Two times a day (BID) | ORAL | 0 refills | Status: DC
Start: 2017-11-18 — End: 2018-01-09

## 2017-11-18 MED ORDER — TRAZODONE HCL 50 MG PO TABS *I*
50.0000 mg | ORAL_TABLET | Freq: Every evening | ORAL | 1 refills | Status: DC
Start: 2017-11-18 — End: 2018-06-10

## 2017-11-18 MED ORDER — TRIAMTERENE-HCTZ 37.5-25 MG PO CAPS *A*
1.0000 | ORAL_CAPSULE | Freq: Every morning | ORAL | 5 refills | Status: DC
Start: 2017-11-18 — End: 2018-05-20

## 2017-11-18 NOTE — Progress Notes (Signed)
[facility] OUTPATIENT PROGRESS NOTE    Patient Name: Amanda Galloway  Age: 72 y.o. DOB: 14-Apr-1945  PCP: Sabino Gasser, MD  CCP: Alicen Donalson      Reason for Appointment  Chief Complaint   Patient presents with    New Patient Visit       History of Present Illness  72 y.o. female with     NPV but already seen by Dr. Vanita Ingles for acute    Neuropathy - taking lyrica and helps a little but not much. Started with chemo    Pancreatic mass - was very sick with pain. Lived in [place] at the time. First was told she was constipated. Had gall bladder out first. Was eventually admitted. 8 chemo treatments prior to surgery. 10 hr surgery. "cancer free". Readmitted for n/v. Had a feeding tube complicated by some skin breakdown. ICU four times with sepsis, hosptial about 6 months. Had a history of pulmonary embolism in the hospital as well.  No longer on blood thinners.    Still followed by onc and surg onc, with scans.    Lost about 30 lbs through all this.  Now appetite is "ravenous" and bowels moving normally.    Cervical stenosis - prior herniated disc about 30 yrs ago. Range of motion limited. Never had injection. Sometimes takes ibuprofen. Worked with OT.     Hypothyroid - TSH recently checked, and was normal    Has low blood pressure at times, sometimes lightheaded, but is okay if she stands up slowly.    Depression - taking Cymbalta 40 mg daily, is working well.    Has been on premarin for years.    Going to the gym - [employer] in [place]      The current medications, current allergies, medical, surgical, family, and social history were reviewed.    Social History     Social History    Marital status: Divorced     Spouse name: N/A    Number of children: N/A    Years of education: N/A     Occupational History    Not on file.     Social History Main Topics    Smoking status: Never Smoker    Smokeless tobacco: Never Used    Alcohol use No    Drug use: No    Sexual activity: No     Social History  Narrative    Lives with daughter in [place]    Previously Glass blower/designer for [employer], now retired    Ex-husband in [place]   Constitutional: Negative for chills, diaphoresis and fever.   Respiratory: Negative for shortness of breath and wheezing.    Cardiovascular: Negative for chest pain and palpitations.   Gastrointestinal: Negative for abdominal pain, diarrhea, nausea and vomiting.   Skin: Negative for rash.   Neurological: Positive for light-headedness. Negative for dizziness and headaches.   Psychiatric/Behavioral: Negative for agitation and confusion.         Examination    Vitals:    11/18/17 1313   BP: 115/59   Pulse: 69   Temp: 35.7 C (96.3 F)   TempSrc: Temporal   Weight: 63.5 kg (140 lb)   Height: 1.676 m (5\' 6" )         Physical Exam   Constitutional: She is oriented to person, place, and time. She appears well-developed and well-nourished. No distress.   HENT:   Head: Normocephalic  and atraumatic.   Eyes: Pupils are equal, round, and reactive to light. Conjunctivae and EOM are normal.   Neck: Normal range of motion. Neck supple.   Cardiovascular: Normal rate, regular rhythm and normal heart sounds.  Exam reveals no gallop and no friction rub.    No murmur heard.  Pulmonary/Chest: Effort normal and breath sounds normal. No respiratory distress. She has no wheezes. She has no rales.   Abdominal: Soft. She exhibits no distension. There is no tenderness. There is no rebound and no guarding.   Neurological: She is alert and oriented to person, place, and time. No cranial nerve deficit. Coordination normal.   Skin: Skin is warm and dry. No rash noted.   Psychiatric: She has a normal mood and affect. Her behavior is normal.       1. 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 Peripheral neuropathy due to chemotherapy  Continue Lyrica    3. Reactive depression  Continue Cymbalta    4. 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    Future Appointments       Provider [place]    11/18/2017 1:20 PM Renny Gunnarson, Vick Frees, MD [facility]     02/10/2018 11:45 AM Delano Metz, MD [facility]     04/30/2018 10:30 AM Hezel, Caffie Pinto, MD [facility] Belling, MD

## 2017-11-18 NOTE — Patient Instructions (Signed)
Ask if you have had prevnar

## 2017-11-20 ENCOUNTER — Encounter: Payer: Self-pay | Admitting: General Surgery

## 2017-11-25 ENCOUNTER — Telehealth: Payer: Self-pay | Admitting: Gastroenterology

## 2017-11-25 NOTE — Telephone Encounter (Signed)
Pamala Hurry of Dr. Carolin Coy office is calling to request a FUV  w/ Dr. Earnstine Regal or Dr. Foye Spurling. States to see notes from 11/11/2017 w/ Dr. Posey Pronto. Patient can be reached at [phone]

## 2017-12-02 NOTE — Telephone Encounter (Signed)
Ms. Paez is calling to schedule an appointment with Dr. Foye Spurling or Dr. Earnstine Regal. The patient would like to be seen for FUV. Please call the patient to schedule at [phone].

## 2017-12-02 NOTE — Telephone Encounter (Signed)
I spoke to this pt to give her 10.21 .... She states that she needs to be seen sooner then that .... Please advise

## 2017-12-02 NOTE — Telephone Encounter (Signed)
Please advise to when this pt needs to return?

## 2017-12-02 NOTE — Telephone Encounter (Signed)
Ok to schedule with you?

## 2017-12-02 NOTE — Telephone Encounter (Signed)
Office appointment.

## 2017-12-03 NOTE — Telephone Encounter (Signed)
Pt is scheduled and notified for 10.3

## 2017-12-03 NOTE — Telephone Encounter (Signed)
Bring for office appointment this Thursday.

## 2017-12-04 NOTE — H&P (Addendum)
[name]  [id number]    12/05/2017    Dear [name], MD  I had the pleasure of seeing your patient Amanda Galloway in the outpatient gastroenterology/hepatology clinic 12/05/2017. As you know, she is a 72 y.o. 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 PMHx includes pancreatic cancer, depression, fibromyalgia, hypothyroidism.    Pt with complicated surgical history including Whipple for pancreatic adenocarcinoma, treated with neoadjuvant chemotherapy and SBRT. Multiple complications following surgery,a at one  Time requirng PEG tube for feeding purupsoes which resulted in new fluic collection behind the stomahc, and suspected perforation from PEG tube placement. PEG tube was ultimately removed on 09/23/17 due to concern for obstruction of efferent limb.     She has been told she is cancer free at this time. No nausea/vomiting or further weight loss, although she does note she has had difficulty gaining weight, but she is eating well. She reports the gastrostomy site has just continued to drain. She has soreness around the fistula site.     Pt denies regurgitation, nausea/vomiting, bloating, abdominal pain, change in appetite, unexplained change in weight, swelling of extremities or abdomen, yellowing of skin or eyes, change in stool frequency/consistency, melena, hematochezia.    Allergies/Sensitivities:No Known Allergies (drug, envir, food or latex)    Medications:   Current Outpatient Prescriptions   Medication Sig    DULoxetine (CYMBALTA) 20 MG DR capsule Take 2 capsules (40 mg total) by mouth every evening    ferrous sulfate 325 (65 FE) MG tablet Take 1 tablet (325 mg total) by mouth daily (with breakfast)    levothyroxine (SYNTHROID, LEVOTHROID) 150 MCG tablet Take 1 tablet (150 mcg total) by mouth daily (before breakfast)    pantoprazole (PROTONIX) 40 MG EC tablet Take 1 tablet (40  mg total) by mouth 2 times daily (before meals)   Swallow whole. Do not crush, break, or chew.    pregabalin (LYRICA) 75 MG capsule Take 1 capsule (75 mg total) by mouth 2 times daily   Max daily dose: 150 mg    PREMARIN 0.625 MG tablet Take 1 tablet (0.625 mg total) by mouth every morning    traZODone (DESYREL) 50 MG tablet Take 1 tablet (50 mg total) by mouth nightly    triamterene-hydrochlorothiazide (DYAZIDE) 37.5-25 MG per capsule Take 1 capsule by mouth every morning    Vitamins - senior (CENTRUM SILVER) TABS Take 1 tablet by mouth daily (Patient taking differently: Take 1 tablet by mouth every morning   )    clobetasol (TEMOVATE) 0.05 % cream Apply topically 2 times daily   To abdomen rash    Red Yeast Rice Extract (RED YEAST RICE PO) Take by mouth    nystatin (NYSTATIN) powder Apply topically 2 times daily    silver nitrate applicators 28-41 % applicator Apply topically three times a week    docusate sodium (COLACE) 100 MG capsule Take 2 capsules (200 mg total) by mouth 2 times daily    ondansetron (ZOFRAN-ODT) 4 MG disintegrating tablet Take 1 tablet (4 mg total) by mouth 3 times daily as needed (nausea)   Place on top of tongue.    SUMAtriptan (IMITREX) 25 MG tablet Take 1 tablet (25 mg total) by mouth as needed for Migraine   Take at onset of headache. May repeat once in 2 hours.    rollator Use as directed.    Nutritional Supplements (PROSOURCE NO CARB) LIQD liquid  Take 30 mLs by mouth 2 times daily (with meals)    senna (SENOKOT) 8.6 MG tablet Take 2 tablets by mouth 2 times daily       Relevant Past Medical Hx:   Past Medical History:   Diagnosis Date    Acute kidney failure 03/31/2017    Cancer     Depression     Fibromyalgia     Hypothyroidism        Relevant Past Surgical Hx:   Past Surgical History:   Procedure Laterality Date    APPENDECTOMY      CERVICAL FUSION      CHOLECYSTECTOMY      CHOLECYSTECTOMY, LAPAROSCOPIC  09/06/2016    HYSTERECTOMY  08/1986    Fibroids    KNEE  SURGERY Right     PR INSERT TUNNELED CV CATH WITH PORT Right 10/04/2016    Procedure: Right IJ MEDIPORT Insertion;  Surgeon: Delano Metz, MD;  Location: [facility] MAIN OR;  Service: Oncology General    PR LAP,DIAGNOSTIC ABDOMEN N/A 10/04/2016    Procedure: LAPAROSCOPY DIAGNOSTIC;  Surgeon: Delano Metz, MD;  Location: [facility] MAIN OR;  Service: Oncology General    PR PART [place] PANC,PROX+REMV DUOD+ANAST N/A 03/29/2017    Procedure: WHIPPLE PROCEDURE;  Surgeon: Delano Metz, MD;  Location: [facility] MAIN OR;  Service: Oncology General    ROTATOR CUFF REPAIR Right     TONSILLECTOMY AND ADENOIDECTOMY         Social Hx:   Social History     Social History    Marital status: Divorced     Spouse name: N/A    Number of children: N/A    Years of education: N/A     Occupational History    Not on file.     Social History Main Topics    Smoking status: Never Smoker    Smokeless tobacco: Never Used    Alcohol use No    Drug use: No    Sexual activity: No     Social History Narrative    Lives with daughter in [place]    Previously Glass blower/designer for [employer], now retired    Ex-husband in [place] Hx:   Family History   Problem Relation Age of Onset    Breast cancer Mother         died 40    Cancer Father         NHL, died 24    Heart Disease Father     Diabetes Sister     Obesity Sister         4 siblings    No Known Problems Daughter        ROS:   General: No fever, chills, fatigue, change in weight.   HEENT: No diplopia, yellowing of eyes, sore throat, hoarseness.   Cardiac: No chest pain, shortness of breath.   Respiratory: No shortness of breath, cough.   Abdomen: Noted in HPI.   Skin: No rash, pallor.   Neuro: No weakness, tremor, change in gait.   Psychiatric:  No somnolence, confusion, forgetfulness.    Endocrine:  No heat or cold intolerance.   Heme/Lymph:  No easy bruising/bleeding.     Physical Exam:   Vitals:    12/05/17 1008   BP: 100/62   Pulse: 83   Weight: 63.3 kg (139 lb 8 oz)    Height: 170.2 cm (_0 )     Body mass index  is 21.85 kg/m.    General/Constitutional: Afebrile, well appearing, in no acute distress.   HEENT: Anicteric sclera. Oral mucosa pink and moist. Neck without masses, trachea midline.   Neuro: A&O x3, gait stable, no gross motor deficits, ambulates without assistive device.   Cardiac: RRR, S1 & S2 present. No gallops, rubs, or murmurs on auscultation.   Respiratory: Lung fields clear to auscultation. Normal respiratory effort.   Abdomen: Bowel sounds present. No tenderness to light or deep palpation in all quadrants. No mass, hepatosplenomegaly, distension, or ascites discernable on exam. Dressing intact to enterocutaneous fistula in the LUQ.  Skin: No rash or erythema. No jaundice.   Extremities: Warm, no pitting edema present to bilateral lower extremities.     LABS:     10/31/2017 14:35 11/08/2017 12:15   Sodium 145    Potassium 4.5    Chloride 108    CO2 23    Anion Gap 14    UN 17    Creatinine 0.86    GFR,Black 78    GFR,Caucasian 68    Glucose 96    Calcium 8.8    Total Protein 7.4    Albumin 4.0    Prealbumin 19 (L)    ALT 42 (H)    AST 49 (H)    Alk Phos 552 (H)    Bilirubin,Total <0.2    TSH  1.39   Free T4  1.1   WBC 5.6    RBC 3.3 (L)    Hemoglobin 9.3 (L)    Hematocrit 32 (L)    MCV 95    MCH 28    MCHC 30 (L)    RDW 17.5 (H)    Platelets 343    Neut # K/uL 3.3    Neutrophil #-Instrument 3.3    Lymph # K/uL 1.5    Mono # K/uL 0.5    Eos # K/uL 0.3    Baso # K/uL 0.1    IMM Granulocytes # 0.0    Nucl RBC # K/uL 0.0    Seg Neut % 58.3    Lymphocyte % 26.8    Monocyte % 8.8    Eosinophil % 4.8    Basophil % 1.1    IMM Granulocytes 0.2    Nucl RBC % 0.0         03/22/2017 16:03 09/02/2017 14:16 09/18/2017 09:56 10/31/2017 14:35   CA 19 9 (eff. 03-2009) 38 (H) 58 (H) 99 (H) 77 (H)       IMAGING:  CT abdomen/pelvis 11/08/17  1. Interval resolution of intrahepatic fluid collection. The previously visualized subcapsular/perihepatic fluid collection along the lateral margin  of the right hepatic lobe is similar to before but in a slightly different distribution. No new    subcapsular or perihepatic fluid collections.      2. Skin retraction and skin thickening prior G-tube tract site of left upper abdomen. No definitive evidence of enterocutaneous fistulous tract at this site.      3. Status post Whipple procedure. Expected pneumobilia. The remaining pancreas is atrophic.      4. Interval resolution of bilateral pleural effusions and associated compressive atelectasis.      END OF IMPRESSION     GI PROCEDURES:  EUS 02/19/17  Impression(s):  Uncinate process mass - ill defined - Fiducials placed.       Recommendation(s):   Return to Radiation oncology.    Findings and suggested plans discussed with the patient and next   of kin  prior to discharge.    Impression/Recommendations:  Yenni is a 72 y.o. 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 She is here to discuss possible endoscopic closure of the G tube site. Although most recent CT scan (11/08/17) does not show definitive fistula tract, she reports that over the last few weeks the drainage has increased, with her seeing evidence of foods consumed in the drainage (purple drainage after eating blueberries). Risks of bleeding, infection, and rarely perforation which may require surgery were discussed with the patient. Explained that patient will need a ride for the procedure as she will receive sedation for the exam. She understands that there is uncertainty about chance of successful closure depending on anatomical position of internal opening in relation to anastomosis, and she would like to proceed with attempt at endoscopic closure..     Plan:   EGD w/ GA 90 min under fluoro    Follow up after procedure or sooner as needed    The patient was seen and evaluated, and plan was determined by Melbourne Jakubiak Foye Spurling, MD. Thank you for allowing [name] to participate in this patient's care. Please do not  hesitate to contact us with any questions or concerns at [phone].      Isabella Stalling, NP-BC    I saw and evaluated the patient. I agree with the resident's/fellow's findings and plan of care as documented above. Details of my evaluation are as follows:       72 year old female referred for gastrocutaneous fistula.    Past medical history of the patient is significant for pancreatic cancer, depression, fibromyalgia, hypothyroidism.    Patient has complicated surgical history.  Last year she started having abdominal pain with weight loss.  Extensive workup was negative. In July, 2018 she underwent cholecystectomy without any improvement of her symptoms.  She had an MRI/MRCP on September 25, 2016 which showed a 2 cm x 2 cm soft tissue mass in the celiac region continous with the medial margin of the uncinate process of the pancreas.  She underwent EUS with FNA on September 27, 2016.  This showed a mass in the uncinate process of the pancreas and a lymph node pressing the celiac axis.  FNA showed adenocarcinoma. The patient was treated with Neoadjuvant chemotherapy (FOLFIRINOX) and neoadjuvant SBRT.  She underwent a Whipple resection of pancreatic cancer on 03/29/2017.  She had complications after the surgery.  She developed fluid collections in the abdomen that were drained percutaneously.  She had a chest tube placed on 06/07/2017 for left pleural effusion.  A PEG tube was placed by the IR on 06/14/2017 for feeding purposes.  After the placement of PEG tube she was found to have new collection behind the stomach.  She had an upper GI with small bowel follow-through that showed leakage of the contrast from the posterior aspect of the gastric body.  The new fluid collection behind the stomach was drained percutaneously and she was treated conservatively for gastric perforation believed to be during PEG tube placement.  In June, 2019 she started having leakage from the PEG site.  The PEG tube was downsized on 08/09/2017.  She was  admitted to the hospital on 09/18/2088 for failure to thrive due to chronic nausea/vomiting and poor PO intake intake. She had an EGD on 09/23/2017 and it was believed that the G-tube is causing gastric outlet obstruction by occluding the opening of the a efferent limb..  The PEG tube was removed on 09/23/2017.  On the day of visit, she is remarkably doing very well.  Since the removal of PEG tube she is no longer having nausea/vomiting.  She is tolerating a regular diet.  Her appetite is good and she is eating very well.  She lost about 30 pounds following the surgery.  However her weight is stable recently.  The site of gastrocutaneous fistula is covered with dressing.  She has to change the dressing about once a day because it gets soaked with gastric contents.    PMH:     Past Medical History:   Diagnosis Date    Acute kidney failure 03/31/2017    Cancer     Depression     Fibromyalgia     Hypothyroidism       Past Surgical History:   Procedure Laterality Date    APPENDECTOMY      CERVICAL FUSION      CHOLECYSTECTOMY      CHOLECYSTECTOMY, LAPAROSCOPIC  09/06/2016    HYSTERECTOMY  08/1986    Fibroids    KNEE SURGERY Right     PR INSERT TUNNELED CV CATH WITH PORT Right 10/04/2016    Procedure: Right IJ MEDIPORT Insertion;  Surgeon: Delano Metz, MD;  [facility]: [facility] MAIN OR;  Service: Oncology General    PR LAP,DIAGNOSTIC ABDOMEN N/A 10/04/2016    Procedure: LAPAROSCOPY DIAGNOSTIC;  Surgeon: Delano Metz, MD;  Location: [facility] MAIN OR;  Service: Oncology General    PR PART [place] PANC,PROX+REMV DUOD+ANAST N/A 03/29/2017    Procedure: WHIPPLE PROCEDURE;  Surgeon: Delano Metz, MD;  Location: [facility] MAIN OR;  Service: Oncology General    ROTATOR CUFF REPAIR Right     TONSILLECTOMY AND ADENOIDECTOMY         Family History:     Family History   Problem Relation Age of Onset    Breast cancer Mother         died 30    Cancer Father         NHL, died 10    Heart Disease Father     Diabetes Sister     Obesity Sister          4 siblings    No Known Problems Daughter        Social History:     Social History   Substance Use Topics    Smoking status: Never Smoker    Smokeless tobacco: Never Used    Alcohol use No       Medications:     Prior to Admission medications    Medication Sig Start Date End Date Taking? Authorizing Provider   DULoxetine (CYMBALTA) 20 MG DR capsule Take 2 capsules (40 mg total) by mouth every evening 11/18/17 12/18/17  Pruett, Vick Frees, MD   ferrous sulfate 325 (65 FE) MG tablet Take 1 tablet (325 mg total) by mouth daily (with breakfast) 11/18/17   Pruett, Vick Frees, MD   levothyroxine (SYNTHROID, LEVOTHROID) 150 MCG tablet Take 1 tablet (150 mcg total) by mouth daily (before breakfast) 11/18/17 12/18/17  Pruett, Vick Frees, MD   pantoprazole (PROTONIX) 40 MG EC tablet Take 1 tablet (40 mg total) by mouth 2 times daily (before meals)   Swallow whole. Do not crush, break, or chew. 11/18/17 12/18/17  Pruett, Vick Frees, MD   pregabalin (LYRICA) 75 MG capsule Take 1 capsule (75 mg total) by mouth 2 times daily   Max daily dose: 150 mg 11/18/17 12/18/17  Pruett, Vick Frees, MD   PREMARIN 0.625  MG tablet Take 1 tablet (0.625 mg total) by mouth every morning 11/18/17   Pruett, Vick Frees, MD   traZODone (DESYREL) 50 MG tablet Take 1 tablet (50 mg total) by mouth nightly 11/18/17 12/18/17  Pruett, Vick Frees, MD   triamterene-hydrochlorothiazide (DYAZIDE) 37.5-25 MG per capsule Take 1 capsule by mouth every morning 11/18/17 05/17/18  Pruett, Vick Frees, MD   clobetasol (TEMOVATE) 0.05 % cream Apply topically 2 times daily   To abdomen rash 11/11/17   Hanchett, [place], NP   Red Yeast Rice Extract (RED YEAST RICE PO) Take by mouth    [provider]   nystatin (NYSTATIN) powder Apply topically 2 times daily 10/28/17   Arty Baumgartner, NP   silver nitrate applicators 93-81 % applicator Apply topically three times a week 10/18/17   Rodrigo Ran, NP   docusate sodium (COLACE) 100 MG capsule Take 2 capsules (200 mg  total) by mouth 2 times daily 09/25/17   Haugh, Alyssa R, PA   ondansetron (ZOFRAN-ODT) 4 MG disintegrating tablet Take 1 tablet (4 mg total) by mouth 3 times daily as needed (nausea)   Place on top of tongue. 09/25/17   Haugh, Alyssa R, PA   SUMAtriptan (IMITREX) 25 MG tablet Take 1 tablet (25 mg total) by mouth as needed for Migraine   Take at onset of headache. May repeat once in 2 hours. 09/25/17   Arty Baumgartner, NP   rollator Use as directed. 08/23/17   Severiano Gilbert, NP   Nutritional Supplements (PROSOURCE NO CARB) LIQD liquid Take 30 mLs by mouth 2 times daily (with meals) 08/20/17   Severiano Gilbert, NP   senna (SENOKOT) 8.6 MG tablet Take 2 tablets by mouth 2 times daily 11/02/16   Hellems, Guinevere A, PA   Vitamins - senior (CENTRUM SILVER) TABS Take 1 tablet by mouth daily  Patient taking differently: Take 1 tablet by mouth every morning    10/14/16   Timmothy Sours, NP       Allergies:   No Known Allergies (drug, envir, food or latex)    ROS:   General: No fever, chills, sweats.   HEENT: No tinnitus, coryza, diplopia, dysgeusia.   Cardiovascular: No chest pain, palpitations.   Respiratory: No dyspnea, cough.   Musculoskeletal: No myalgias.   GI: See above and HPI.   GU: No dysuria, hematuria.   Skin: No rash, pruritus, jaundice.   Neuro: No focal numbness, weakness, tremor.   Psychiatric: No somnolence, confusion, dysphoria.   Endocrine: No heat or cold intolerance.  Heme/Lymph: No easy bruising/bleeding. No concerning lumps.        PHYSICAL EXAM:  Temp Readings from Last 3 Encounters:   11/18/17 35.7 C (96.3 F) (Temporal)   11/11/17 36.2 C (97.2 F) (Temporal)   11/11/17 35.9 C (96.6 F) (Temporal)     BP Readings from Last 3 Encounters:   12/05/17 100/62   11/18/17 115/59   11/11/17 117/56     Pulse Readings from Last 3 Encounters:   12/05/17 83   11/18/17 69   11/11/17 72         Vitals:   Vitals:    12/05/17 1008   BP: 100/62   Pulse: 83   Weight: 63.3 kg (139 lb 8 oz)   Height:  170.2 cm (_0 )     Body mass index is 21.85 kg/m.    General:NAD, cooperative, appears stated age  Skin: No rashes, jaundice, spider angiomata, palmar erythema, or  telangiectasias. Warm and dry.   HEENT: NC/AT, EOMI, PERRLA, no glossitis, cheilosis, or icterus. No oropharyngeal abnormalities. No ulcers.  Lungs: Clear bilaterally to auscultation. Normal respiratory effort.   Cardiac: RRR without murmur, rub, or gallop. No heave or thrill.   Abdomen: Normal bowel sounds, no bruits. No distention. Nontender.    Rectum: Deferred, as it was not pertinent to this evaluation.   Extremities: Warm, normal pedal pulses, good capillary refill of nail beds, no edema.  Neuro: Alert and oriented x 3 with appropriate affect. No asterixis     LAB DATA:     Results for MELAYAH, SKORUPSKI (MRN [id number]) as of 12/05/2017 17:21   Ref. Range 10/31/2017 14:35   Sodium Latest Ref Range: 133 - 145 mmol/L 145   Potassium Latest Ref Range: 3.3 - 5.1 mmol/L 4.5   Chloride Latest Ref Range: 96 - 108 mmol/L 108   CO2 Latest Ref Range: 20 - 28 mmol/L 23   Anion Gap Latest Ref Range: 7 - 16  14   UN Latest Ref Range: 6 - 20 mg/dL 17   Creatinine Latest Ref Range: 0.51 - 0.95 mg/dL 0.86   GFR,Black Latest Units: * 78   GFR,Caucasian Latest Units: * 68   Glucose Latest Ref Range: 60 - 99 mg/dL 96   Calcium Latest Ref Range: 8.6 - 10.2 mg/dL 8.8   Total Protein Latest Ref Range: 6.3 - 7.7 g/dL 7.4   Albumin Latest Ref Range: 3.5 - 5.2 g/dL 4.0   Prealbumin Latest Ref Range: 20 - 40 mg/dL 19 (L)   ALT Latest Ref Range: 0 - 35 U/L 42 (H)   AST Latest Ref Range: 0 - 35 U/L 49 (H)   Alk Phos Latest Ref Range: 35 - 105 U/L 552 (H)   Bilirubin,Total Latest Ref Range: 0.0 - 1.2 mg/dL <0.2   WBC Latest Ref Range: 4.0 - 10.0 THOU/uL 5.6   RBC Latest Ref Range: 3.9 - 5.2 MIL/uL 3.3 (L)   Hemoglobin Latest Ref Range: 11.2 - 15.7 g/dL 9.3 (L)   Hematocrit Latest Ref Range: 34 - 45 % 32 (L)   MCV Latest Ref Range: 79 - 95 fL 95   MCH Latest Ref Range: 26 - 32  pg/cell 28   MCHC Latest Ref Range: 32 - 36 g/dL 30 (L)   RDW Latest Ref Range: 11.7 - 14.4 % 17.5 (H)   Platelets Latest Ref Range: 160 - 370 THOU/uL 343   Neut # K/uL Latest Ref Range: 1.6 - 6.1 THOU/uL 3.3   Neutrophil #-Instrument Latest Units: THOU/uL 3.3   Lymph # K/uL Latest Ref Range: 1.2 - 3.7 THOU/uL 1.5   Mono # K/uL Latest Ref Range: 0.2 - 0.9 THOU/uL 0.5   Eos # K/uL Latest Ref Range: 0.0 - 0.4 THOU/uL 0.3   Baso # K/uL Latest Ref Range: 0.0 - 0.1 THOU/uL 0.1   IMM Granulocytes # Latest Units: THOU/uL 0.0   Nucl RBC # K/uL Latest Ref Range: 0.0 - 0.0 THOU/uL 0.0   Seg Neut % Latest Units: % 58.3   Lymphocyte % Latest Units: % 26.8   Monocyte % Latest Units: % 8.8   Eosinophil % Latest Units: % 4.8   Basophil % Latest Units: % 1.1   IMM Granulocytes Latest Units: % 0.2   Nucl RBC % Latest Ref Range: 0.0 - 0.2 /100 WBC 0.0   CA 19 9 (eff. 03-2009) Latest Ref Range: 0 - 35 U/mL 77 (H)         /  14/2019 10:25 AM - Charlton Amor, MD     Narrative     Charlton Amor, MD   08/16/2017 10:25 AM  Procedure Report    08/16/2017  Tad Moore  [phone]    Procedure: EGD  Pre op Diagnosis:  Nausea and vomiting  Post op Diagnosis:  Gastroparesis; mild candida esophagitis    Risks, benefits, alternatives discussed prior to consent.  Preprocedure pause and patient identification performed   ("time-out") in the procedure room before starting.    EBL <1 ml    Medications used for sedation: Versed 8 mg IV Fentanyl 50 mcg IV  I personally ordered and supervised administration of all   moderate sedation medications.    Upper endoscopy carried out to the efferent small bowel limb, out   of the stomach anastomosis, in approximately 30-40 cm. There was   residual food and pill debris in the stomach that limited one   wall evaluation, but much of the stomach seen.    Findings:    The esophagus was mildly normal. Mild candida esophagitis seen   proximally. No masses, no other mucosal abnormalities, no   Barrett's esophagus,  no varices. The gastroesophageal junction   was located 34 cm from the incisors.      The residual stomach and anastomosis was normal in regards to: no   masses, no ulcers, no erosions, no vascular abnormalities...   However posterior wall covered by un suctionable debris. No   outlet obstruction seen.  A gastrostomy balloon is seen in the distal stomach/anastomosis.   Mild gastritis seen on proximal mucosal edge but no ulcer seen.           The efferent small bowel limb was intubated about 30-40 cm in and   it was normal. No ulcers or strictures. No masses. Villi were   seen.         Impression: No overt outlet obstruction. Suspect gastroparesis.   Mild candida esophagitis proximally    Recommendation:  "Low particle diet" if taking po (Am J   Gastroenterol. 2014 Mar;109(3):375-85. doi: 10.1038/ajg.2013.453   )   A very short course of diflucan should treat the esophagitis.      Nelly Laurence, MD  Associate Professor of Clinical Medicine  Attending Physician, Division of Gastroenterology and Hepatology,   Alfonso Ramus, MD   09/23/2017 10:40 AM    EGD Procedure Note   Date of Procedure: 09/23/2017    Referring Physician: Charolotte Eke, MD   Primary Care Provider: Provider, None   Attending Physician: Harlin Rain, MD  Fellow: [place], MD  Indication(s): Gastric outlet obstruction. Previous EGD on 7/18   and 7/19 with retained food products.     Medications: Fentanyl 100 mcg IV and Midazolam 6 mg IV were   administered incrementally over the course of the procedure to   achieve an adequate level of conscious sedation.  Moderate Sedation Face Times  Start Time: 2542  End Time: 0920  Duration (minutes): 27 Minutes       Endoscope: [id number]  Accessories:None    Procedure Description: Full disclosure of risks were reviewed   with patient as detailed on the consent form. The patient was   placed in the left lateral decubitus position and monitored with   continuous pulse oximetry,  interval blood pressure monitoring and   direct observations.   After adequate sedation, the endoscope was carefully introduced   into the oropharynx and passed in  to the esophagus under direct   visualization. The esophagus and GE junction were carefully   examined. After advancement of the endoscope into the stomach, a   careful examination was performed. The scope was then advanced   into the small bowel which was carefully examined. The stomach   was then decompressed and the endoscope withdrawn.   Findings and intervention(s) are detailed below. The patient was   recovered in the GI recovery area    Findings:   Esophagus:   Slightly irregular z line. Otherwise normal esophagus.     Stomach/small bowel :   Noted was a small amount of liquid and semi-solid food debris,   markedly improved from prior EGD.    Health G-J anastomosis. No stricture or ulcer.    We first encountered a limb straight ahead and intubated this   apparent blind limb and it was largely unremarkable however could   not advance distally at this limb to further evaluate.   We initially had a hard time localizing the efferent limb of   the gastrojejunostomy. We localized it to 11 o'clock at an   angulation behind a fold and it appeared the G-tube was sitting   at the level of the anastomosis. We then noticed that the G-tube   balloon was nearly completely occluding the opening to the   efferent limb at 11 o'clock (images 1 and 2 below). This   presented much difficulty navigating the endoscope past. We then   deflated the G-tube balloon and were then able to intubate the   efferent limb beyond the tube. There efferent limb was largely   unremarkable without any strictures or narrowing x 30 cm. We then   re-inflated the gastric balloon to 15 cc (previously inflated to   20 cc). Manipulation of the balloon clearly identified this as   causing outlet obstruction and is the etiology for GOO.    Deflated balloon:      Intervention(s):    None    Complication(s):   none    EBL: 0 ml    Impression(s):  1. Gastric outlet obstruction from G-tube balloon causing near   complete occlusion of the opening of the presumed efferent limb   at the level of the GJ anastomosis, as described above.     Recommendation(s):   Recommend removing the G tube to relieve the outlet   obstruction.    If patient needs enteral tube for continued nutritional   support, recommend re-placing it at another location either more   proximally in the stomach or more distally in the small bowel.    Histopathologic Diagnosis:  none    [place], MD     GI ATTENDING      IMAGING:     CT abdomen and pelvis without and with contrast   Status: Final result   PACS Images     Show images for CT abdomen and pelvis without and with contrast   Reading Radiologist:   Leandrew Koyanagi, MD  Jeannene Patella, PA  Signed By:  Leandrew Koyanagi, MD on 11/11/2017  6:12 PM      1. Interval resolution of intrahepatic fluid collection. The previously visualized subcapsular/perihepatic fluid collection along the lateral margin of the right hepatic lobe is similar to before but in a slightly different distribution. No new    subcapsular or perihepatic fluid collections.      2. Skin retraction and skin thickening prior G-tube tract site of left upper abdomen. No  definitive evidence of enterocutaneous fistulous tract at this site.      3. Status post Whipple procedure. Expected pneumobilia. The remaining pancreas is atrophic.      4. Interval resolution of bilateral pleural effusions and associated compressive atelectasis.           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  Patient has history of pancreatic cancer and underwent Whipple resection in January, 2019.  After the surgery she had a long hospital stay because of complications.  She had a PEG tube placed for nutrition. After the PEG tube placement, she was  found to have gastric perforation related to   placement of PEG tube.  This was treated conservatively.  She continued to have nausea/vomiting.  Her last EGD done on 09/23/2017 showed that the balloon of the PEG tube was causing obstruction of the efferent limb.  The PEG tube was removed and her nausea/vomiting resolved.  Patient is doing very well now.  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  Fortunately she is not having too much drainage from the fistula.  The complicating issue in her will be the location of the inner opening of the fistula.  If the inner opening of the fistula is very close to the anastomosis we may not be able to use Ovesco clip as this may interfere with the opening of the efferent limb.  Other option is using fistula plugs.  We will decide at the time of procedure which method to use.        Paymon Rosensteel.   Department of Gastroenterology and Hepatology

## 2017-12-05 ENCOUNTER — Encounter: Payer: Self-pay | Admitting: Gastroenterology

## 2017-12-05 ENCOUNTER — Telehealth: Payer: Self-pay | Admitting: Gastroenterology

## 2017-12-05 ENCOUNTER — Ambulatory Visit
Admission: RE | Admit: 2017-12-05 | Discharge: 2017-12-05 | Disposition: A | Discharge status: Home / Self Care | Payer: Medicare (Managed Care) | Source: Ambulatory Visit | Attending: Gastroenterology | Admitting: Gastroenterology

## 2017-12-05 DIAGNOSIS — K316 Fistula of stomach and duodenum: Secondary | ICD-10-CM

## 2017-12-05 NOTE — Discharge Instructions (Addendum)
Schedule endoscopy with Dr. Foye Spurling    Will make follow-up arrangements after procedure, or sooner as needed          Division of Gastroenterology and Hepatology    ENDOSCOPY INSTRUCTIONS AT [facility]      Date: *** Time: ***   Location: [facility]   Dr. Marland Kitchen   Plan to be here for 3 hours Take the Silver Elevators to the 4th Floor, G.I. Procedures Suite      Thank you for choosing [facility] for your endoscopic procedure.  In an effort to provide you with the best possible exam, we will need your assistance with the following instructions    If you believe you may be pregnant or need to cancel your appointment for any reason, please call our office at least 48 hours prior to your scheduled appointment at [phone].    If you take any of the following medications, you must be seen in the office at least two weeks BEFORE your procedure, in order to receive careful instructions on how to manage your medications.    Coumadin (warfarin)   Plavix (clopidogrel)   Ticlid (ticlopidine)    Lovenox (enoxaparin)        Heparin    Aggrenox (aspirin and dipyridamole)  Effient (prasugrel)    Additionally, if you have any of the conditions listed immediately below, then you must be seen in the office at least two weeks BEFORE your procedure, in order to receive careful evaluation and instructions.     If you take any drug for the purpose of being anti-coagulated, keeping your blood thin, or preventing blood clots;    If you are being treated or have been treated for stroke or transient ischemic attacks (TIAs), or mini-strokes;   If you have artificial heart valves, or coronary artery or carotid artery stents     If you take any medications for diabetes, by injection or oral tablets, then you must be seen in the office at least two weeks BEFORE your procedure, in order to receive careful instructions on how to manage these medications.  Examples of medication  -  Glucophage,  Metformin, glipizide, actos, Lantus, Humulin, or any other insulin.    Please note that aspirin is no longer routinely discontinued prior to our procedures, unless you receive specific instructions to do so.     Unless otherwise instructed, continue to take all of your usual medications on your regular schedule.  If you have an early morning procedure, please bring your medications with you to take after the procedure.     If you need an office appointment, or if you need to review your instructions, please call   [phone] as soon as possible.       ON THE DAY OF YOUR PROCEDURE:       DO NOT EAT ANY SOLID FOOD THE DAY OF YOUR PROCEDURE    Clear liquid diet only until 4 hours prior to your scheduled arrival time.- after that time, nothing by mouth.     CLEAR LIQUID DIET:                                               YOU MAY HAVE (examples):       DO NOT  HAVE (examples):   Clear soda (ginger ale, Sprite)  Red or purple Jell-O   Clear juice (apple, white cranberry)               Beef broth   Gatorade/Powerade (clear or yellow)            Alcohol   Chicken broth (nothing in it)                           Milk or milk products   Black Coffee/Tea (no cream)                         Yogurt or pudding   Jell-O (yellow, orange, green)                        Fudgsicles   Popsicles (not red or purple)                          Cream soups     Please arrive on time. Allow extra time in your trip for weather, traffic and parking. Parking is validated for the hospitals garage at the time of your discharge.   Please bring a list of all the medications you take, including dosages and how often each medication is taken.   Also list any herbal or vitamin supplements you take.  When you check in you will be asked if you have brought that list, if you have not, we will ask you to create the medication list at that time.  Having an up to date list of all your medications helps [name] provide safe  patient care.   Personal belongings:   On the day of your sedated procedure, we strongly recommend that you leave valuables (money/jewelry) at home or give them to a family member or friend for safekeeping.  However, a small secure storage area is available on the unit for storage of your valuables if necessary.  Please alert the nurse in the admitting area if you need to use the secure storage area.   While endoscopy is a generally safe procedure, there is a small chance of developing complications that may not be identified up to a week or later after your procedure.  We suggest that you do not plan this procedure within a couple of weeks prior to traveling, or an important social event.        AFTER YOUR PROCEDURE:     A responsible person must pick you up after your procedure to accompany you home, as you will have been sedated and will not be allowed to drive home. You can NOT use public transportation- like bus, [place], or [place].      REMEMBER: You may not drive, work, or engage in important decisions (e.g. financial issues) for the rest of the day after your procedure.     Your specific discharge instructions will be reviewed with you after your procedure.       A copy of the report and results will automatically be sent to your Primary Care Physician and the referring physician.    IF YOUR APPOINTMENT IS IN THE AFTERNOON, YOUR DRIVER MUST BE ON PREMISES BY 4:00PM.     If you have questions about these instructions, you may call us at [phone] between 8:00a.m.-4:30 p.m.  Monday through Friday and we will be glad to answer them for you.      The [employer]  of [facility] has joined hundreds of health care institutions around the nation that have become Smoke-Free throughout their campuses.  We respectfully request that our patients and visitors refrain from smoking while at the [facility].                        Diabetic Medication Instructions    Oral  Medications (pills):   While on clear liquids for your procedure preparation, you will need to decrease the dose of oral diabetic medications in half.   ? For example: If you take Metformin, 2 tablets every morning, you will only take 1 tablet each morning while on clear liquids.    ? For example: If you take Metformin, 2 tablets twice daily, you will only take 1 tablet twice daily while on clear liquids.    *Do not take any diabetic pills the morning of your procedure.*  Insulin:  Short Acting/Rapid Acting Insulin: i.e., Novolin R, Novolog, Humulin R, Humulog, Novorapid.  While on clear liquids, please do not take short acting insulin.  On the day of the procedure, do not take the morning dose of your insulin.   Intermediate/Long Acting Insulin:  Lantus, Levemir, Humulin N, Novolin N.  While on clear liquids, decrease insulin dose by  your normal dose.    ? For example: If you usually take Lantus 30 units each evening, you will reduce this to 15 units for the evening before the procedure (while on clear liquids).    *Do not take your morning dose of insulin before your procedure.*    If you are unsure of how to adjust your diabetic medications, or would like to speak to a nurse regarding this, please contact the GI office at [phone] and we will be happy to assist you.                  Marland Kitchen

## 2017-12-05 NOTE — Telephone Encounter (Signed)
Per Dr. Foye Spurling pt needs to be scheduled for EGD w/ GA for 90 min under flouro. Pt is not taking any blood thinners. Gave pt prep instructions.

## 2017-12-05 NOTE — Telephone Encounter (Signed)
Next available?

## 2017-12-10 ENCOUNTER — Telehealth: Payer: Self-pay

## 2017-12-10 ENCOUNTER — Encounter: Payer: Self-pay | Admitting: Student in an Organized Health Care Education/Training Program

## 2017-12-10 ENCOUNTER — Telehealth: Payer: Self-pay | Admitting: Gastroenterology

## 2017-12-10 NOTE — Telephone Encounter (Signed)
Pre procedure screening call completed. Spoke to patient regarding arrival time of noon, NPO after MN and need for a driver home. Patient states she will drive her self but knows she needs to have someone give her a ride home. Patient verbalized understanding.

## 2017-12-10 NOTE — Telephone Encounter (Signed)
CONFIRMED 10.9 AT 12N   Pt has been given verbal instructions (date/time/location/prep)         Pt was added to 10.9 schedule

## 2017-12-10 NOTE — Anesthesia Preprocedure Evaluation (Addendum)
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 Foye Spurling scheduled for 90 minutes.    Allergies:  No Known Allergies (drug, envir, food or latex)    Habitus:  - Estimated body mass index is 21.85 kg/m as calculated from the following:     Height as of 12/05/17: 1.702 m (_0 ).    Weight as of 12/05/17: 63.3 kg (139 lb 8 oz).    PMH:  - Hx PE  - HTN - triamterene-HCTZ  - gastrocutaneous fistula s/p PEG - pantoprazole  - pancreatic cancer s/p Whipple   - hypothyroidism - synthroid  - depression - duloxetine  - fibromyalgia - lyrica, imitrex  - peripheral neuropathy secondary to chemotherapy    Never smoker    Anesthetic history:  Jan 2019 - Whipple - GA, ETT 7.0 ETT, glidescope size 3, Grade IIb view, BURP, first attempt by OMFS resident, tried to pass bougie but difficult angle, success on 2nd attempt by attending. Mallampati I, normal mouth opening, >3FB TM  10/04/16 LAPAROSCOPY DIAGNOSTIC, Right IJ MEDIPORT Insertion - GA - ETT 7.0 - video laryngoscopy - MAC 3 - grade IIa partial view of glottis - unsuccessful attempts with oral ETT and DL - Mallampati II       Stress Test/Echocardiography:  Feb 2019: Normal LVEF without significant wall motion abnormalities. No significant valvular abnormalities. Normal right heart size and function with estimated normal RV systolic pressures. Negative saline contrast study for right to left shunting. Bilateral pleural effusion.  Electrophysiology/AICD/Pacer:  09/22/17: NSR    .  Marland Kitchen  Anesthesia Evaluation Information Source: records, patient     ANESTHESIA HISTORY    + history of anesthetic complications          PONV        HEENT    + TMJD    + Neck Pain            radicular pain with movement  Pertinent (-):  No hx of stridor PULMONARY  Pertinent(-):  No smoking    CARDIOVASCULAR  Good(4+METs) Exercise Tolerance    + Hypertension           well controlled    + Hx of DVT          with PE    GI/HEPATIC/RENAL  Last PO Intake: >8hr before procedure and >2hr before procedure (clears)    + Bowel Issues (gastrocutaneous fistula s/p PEG)    + Pancreatic Disease (pancreatitis cancer s/p Whipple) NEURO/PSYCH    + Chronic pain          fibromyalgia    + Psychiatric Issues          depression    + Peripheral Nerve Issue          peripheral neuropathy    ENDO/OTHER    + Thyroid Disease        HYPOthyroid     + Chemo Hx (completed chemotherapy in past)             Physical Exam    Airway            Mouth opening: limited            Mallampati: III            TM distance (fb): >3 FB  Neck ROM: full            Airway Impression: challenging  Dental   Normal Exam   Cardiovascular           Rhythm: regular           Rate: normal  Vascular Access            > 1 PIV       Pulmonary     breath sounds clear to auscultation    Mental Status     oriented to person, place and time         ________________________________________________________________________  PLAN  ASA Score  3  Anesthetic Plan general     Induction (routine IV) General Anesthesia/Sedation Maintenance Plan (inhaled agents);  Airway Manipulation (video laryngoscope); Airway (cuffed ETT); Line ( use current access); Monitoring (standard ASA); Positioning (lateral decubitus); PONV Plan (dexamethasone and ondansetron); Pain (per surgical team); PostOp (PACU)    Informed Consent     Risks:         Risks discussed were commensurate with the plan listed above with the following specific points: N/V, aspiration, sore throat, unsteadiness, dizziness and fatigue, Damage to: eyes and teeth, allergic Rx and unexpected serious injury.    Anesthetic Consent:         Anesthetic plan (and risks as noted above) were discussed with patient    Plan also discussed with team members including:       resident    Attending Attestation:  As the primary attending anesthesiologist, I attest that the patient or proxy  understands and accepts the risks and benefits of the anesthesia plan. I also attest that I have personally performed a pre-anesthetic examination and evaluation, and prescribed the anesthetic plan for this particular location within 48 hours prior to the anesthetic as documented. Milon Dikes, MD 4:03 PM

## 2017-12-11 ENCOUNTER — Encounter: Payer: Self-pay | Admitting: Gastroenterology

## 2017-12-11 ENCOUNTER — Ambulatory Visit
Admission: RE | Admit: 2017-12-11 | Discharge: 2017-12-11 | Disposition: A | Discharge status: Home / Self Care | Payer: Medicare (Managed Care) | Source: Ambulatory Visit | Attending: Gastroenterology | Admitting: Gastroenterology

## 2017-12-11 ENCOUNTER — Ambulatory Visit: Payer: Medicare (Managed Care) | Admitting: Physical Medicine and Rehabilitation

## 2017-12-11 ENCOUNTER — Ambulatory Visit
Admission: RE | Admit: 2017-12-11 | Discharge: 2017-12-11 | Disposition: A | Discharge status: Home / Self Care | Payer: Medicare (Managed Care) | Source: Ambulatory Visit | Attending: Physical Medicine and Rehabilitation | Admitting: Physical Medicine and Rehabilitation

## 2017-12-11 ENCOUNTER — Ambulatory Visit: Payer: Medicare (Managed Care) | Admitting: Anesthesiology

## 2017-12-11 ENCOUNTER — Encounter: Payer: Self-pay | Admitting: Physical Medicine and Rehabilitation

## 2017-12-11 ENCOUNTER — Encounter: Payer: Self-pay | Admitting: Student in an Organized Health Care Education/Training Program

## 2017-12-11 ENCOUNTER — Ambulatory Visit: Admit: 2017-12-11 | Payer: Medicare (Managed Care)

## 2017-12-11 ENCOUNTER — Other Ambulatory Visit: Payer: Self-pay | Admitting: Gastroenterology

## 2017-12-11 VITALS — BP 100/60 | Ht 67.0 in | Wt 139.0 lb

## 2017-12-11 DIAGNOSIS — Z9049 Acquired absence of other specified parts of digestive tract: Secondary | ICD-10-CM | POA: Insufficient documentation

## 2017-12-11 DIAGNOSIS — Z9889 Other specified postprocedural states: Secondary | ICD-10-CM | POA: Insufficient documentation

## 2017-12-11 DIAGNOSIS — M5412 Radiculopathy, cervical region: Secondary | ICD-10-CM

## 2017-12-11 DIAGNOSIS — Z8507 Personal history of malignant neoplasm of pancreas: Secondary | ICD-10-CM | POA: Insufficient documentation

## 2017-12-11 DIAGNOSIS — K632 Fistula of intestine: Secondary | ICD-10-CM

## 2017-12-11 DIAGNOSIS — M50322 Other cervical disc degeneration at C5-C6 level: Secondary | ICD-10-CM

## 2017-12-11 DIAGNOSIS — K6389 Other specified diseases of intestine: Secondary | ICD-10-CM | POA: Insufficient documentation

## 2017-12-11 DIAGNOSIS — Z90411 Acquired partial absence of pancreas: Secondary | ICD-10-CM | POA: Insufficient documentation

## 2017-12-11 MED ORDER — SUCCINYLCHOLINE CHLORIDE 20 MG/ML IV/IJ SOLN *WRAPPED*
Status: DC | PRN
Start: 2017-12-11 — End: 2017-12-11
  Administered 2017-12-11: 80 mg via INTRAVENOUS

## 2017-12-11 MED ORDER — EPHEDRINE 5MG/ML IN NS IV/IJ *WRAPPED*
INTRAMUSCULAR | Status: DC | PRN
Start: 2017-12-11 — End: 2017-12-11
  Administered 2017-12-11 (×2): 10 mg via INTRAVENOUS
  Administered 2017-12-11: 5 mg via INTRAVENOUS

## 2017-12-11 MED ORDER — DEXAMETHASONE SODIUM PHOSPHATE 4 MG/ML INJ SOLN *WRAPPED*
INTRAMUSCULAR | Status: DC | PRN
Start: 2017-12-11 — End: 2017-12-11
  Administered 2017-12-11: 4 mg via INTRAVENOUS
  Administered 2017-12-11: 6 mg via INTRAVENOUS

## 2017-12-11 MED ORDER — PROPOFOL 10 MG/ML IV EMUL (INTERMITTENT DOSING) WRAPPED *I*
INTRAVENOUS | Status: DC | PRN
Start: 2017-12-11 — End: 2017-12-11
  Administered 2017-12-11: 40 mg via INTRAVENOUS
  Administered 2017-12-11: 140 mg via INTRAVENOUS

## 2017-12-11 MED ORDER — FENTANYL CITRATE 50 MCG/ML IJ SOLN *WRAPPED*
INTRAMUSCULAR | Status: AC
Start: 2017-12-11 — End: 2017-12-11
  Filled 2017-12-11: qty 2

## 2017-12-11 MED ORDER — LACTATED RINGERS IV SOLN *I*
100.0000 mL/h | INTRAVENOUS | Status: DC
Start: 2017-12-11 — End: 2017-12-12

## 2017-12-11 MED ORDER — PROMETHAZINE HCL 25 MG/ML IJ SOLN *I*
INTRAMUSCULAR | Status: DC | PRN
Start: 2017-12-11 — End: 2017-12-11
  Administered 2017-12-11: 12.5 mg via INTRAVENOUS

## 2017-12-11 MED ORDER — CEFAZOLIN SODIUM 1000 MG IJ SOLR *I*
INTRAMUSCULAR | Status: DC | PRN
Start: 2017-12-11 — End: 2017-12-11
  Administered 2017-12-11: 2000 mg via INTRAVENOUS

## 2017-12-11 MED ORDER — FENTANYL CITRATE 50 MCG/ML IJ SOLN *WRAPPED*
INTRAMUSCULAR | Status: DC | PRN
Start: 2017-12-11 — End: 2017-12-11
  Administered 2017-12-11 (×2): 50 ug via INTRAVENOUS

## 2017-12-11 NOTE — Preop H&P (Signed)
OUTPATIENT PRE-PROCEDURE H&P    Chief Complaint / Indications for Procedure: gastrocutaneous fistula    Past Medical History:     Past Medical History:   Diagnosis Date    Acute kidney failure 03/31/2017    Cancer     Depression     Fibromyalgia     Hypothyroidism     Pancreatic cancer      Past Surgical History:   Procedure Laterality Date    APPENDECTOMY      CERVICAL FUSION      CHOLECYSTECTOMY      CHOLECYSTECTOMY, LAPAROSCOPIC  09/06/2016    HYSTERECTOMY  08/1986    Fibroids    KNEE SURGERY Right     PR INSERT TUNNELED CV CATH WITH PORT Right 10/04/2016    Procedure: Right IJ MEDIPORT Insertion;  Surgeon: Delano Metz, MD;  Location: [facility] MAIN OR;  Service: Oncology General    PR LAP,DIAGNOSTIC ABDOMEN N/A 10/04/2016    Procedure: LAPAROSCOPY DIAGNOSTIC;  Surgeon: Delano Metz, MD;  Location: [facility] MAIN OR;  Service: Oncology General    PR PART [place] PANC,PROX+REMV DUOD+ANAST N/A 03/29/2017    Procedure: WHIPPLE PROCEDURE;  Surgeon: Delano Metz, MD;  Location: [facility] MAIN OR;  Service: Oncology General    ROTATOR CUFF REPAIR Right     TONSILLECTOMY AND ADENOIDECTOMY      whipple       Family History   Problem Relation Age of Onset    Breast cancer Mother         died 44    Cancer Father         NHL, died 55    Heart Disease Father     Diabetes Sister     Obesity Sister         4 siblings    No Known Problems Daughter      Social History     Social History    Marital status: Divorced     Spouse name: N/A    Number of children: N/A    Years of education: N/A     Social History Main Topics    Smoking status: Never Smoker    Smokeless tobacco: Never Used    Alcohol use No    Drug use: No    Sexual activity: No     Other Topics Concern    None     Social History Narrative    Lives with daughter in [place]    Previously Glass blower/designer for [employer], now retired    Ex-husband in [place]:  No Known Allergies (drug, envir, food or latex)    Medications:    (Not in a hospital  admission)   Current Outpatient Prescriptions   Medication    DULoxetine (CYMBALTA) 20 MG DR capsule    ferrous sulfate 325 (65 FE) MG tablet    levothyroxine (SYNTHROID, LEVOTHROID) 150 MCG tablet    pantoprazole (PROTONIX) 40 MG EC tablet    pregabalin (LYRICA) 75 MG capsule    PREMARIN 0.625 MG tablet    traZODone (DESYREL) 50 MG tablet    triamterene-hydrochlorothiazide (DYAZIDE) 37.5-25 MG per capsule    clobetasol (TEMOVATE) 0.05 % cream    Red Yeast Rice Extract (RED YEAST RICE PO)    nystatin (NYSTATIN) powder    docusate sodium (COLACE) 100 MG capsule    ondansetron (ZOFRAN-ODT) 4 MG disintegrating tablet    SUMAtriptan (IMITREX) 25 MG tablet    rollator  Nutritional Supplements (PROSOURCE NO CARB) LIQD liquid    senna (SENOKOT) 8.6 MG tablet    Vitamins - senior (CENTRUM SILVER) TABS    silver nitrate applicators 27-25 % applicator     Current Facility-Administered Medications   Medication Dose Route Frequency    Lactated Ringers Infusion  100 mL/hr Intravenous Continuous     Vitals:    12/11/17 1240   BP: 112/59   Pulse: 72   Resp: 16   Temp: 36.4 C (97.5 F)   Weight:    Height:        ROS:  GI:See HPI.    Physical Examination:  Head/Nose/Throat:negative  Lungs:Lungs clear  Cardiovascular:normal S1 and S2  Abdomen: abdomen soft, non-tender, nondistended, normal active bowel sounds, no masses or organomegaly      Lab Results: none    Radiology impressions (last 30 days):  Spine Cervical Limited Ap And Lateral Views    Result Date: 12/11/2017  12/11/2017 11:17 AM CERVICAL SPINE X-RAYS CLINICAL INFORMATION:  neck pain, M54.12-Radiculopathy, cervical region. COMPARISON:  None. PROCEDURE: Two projections of the cervical spine were obtained.  FINDINGS/IMPRESSION:  Moderate to severe degenerative disc disease of the mid to lower cervical spine most progressed at C5-C6 and C6-C7. No acute fracture or subluxation. Osteopenia. END OF IMPRESSION [facility] submits this DICOM format image data  and final report to the [facility] an independent secure electronic health information exchange, on a reciprocally searchable basis (with patient authorization) for a minimum of 12 months after exam date.      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 Updates to Patient's Condition (to be completed by a provider privileged to complete a H&P, following reassessment of the patient by the provider):    Full H&P done today; no updates needed.    II. Procedure Readiness   I have reviewed the patient's H&P and updated condition. By completing and signing this form, I attest that this patient is ready for surgery/procedure.      III. Attestation   I have reviewed the updated information regarding the patient's condition and it is appropriate to proceed with the planned surgery/procedure.    Amanda Buskey, MD as of 1:11 PM 12/11/2017

## 2017-12-11 NOTE — Anesthesia Procedure Notes (Signed)
---------------------------------------------------------------------------------------------------------------------------------------    AIRWAY   GENERAL INFORMATION AND STAFF    Patient location during procedure: Procedure Rm       Date of Procedure: 12/11/2017 2:59 PM  CONDITION PRIOR TO MANIPULATION     Current Airway/Neck Condition:  Normal        For more airway physical exam details, see Anesthesia PreOp Evaluation  AIRWAY METHOD     Patient Position:  Sniffing    Preoxygenated: yes      Induction: IV and Succinylcholine  Mask Difficulty Assessment:  1 - vent by mask      Technique Used for Successful ETT Placement:  Video laryngoscopy    Devices/Methods Used in Placement:  Intubating stylet    Laryngoscope Blade/Video laryngoscope Blade Size:  4    Cormack-Lehane Classification:  Grade IIb - view of arytenoids or posterior of glottis only    Placement Verified by: capnometry, auscultation, palpation of cuff and equal breath sounds      Number of Attempts at Approach:  1    Number of Other Approaches Attempted:  0  FINAL AIRWAY DETAILS    Final Airway Type:  Endotracheal airway    Adjunct Airway: soft bite block    Final Endotracheal Airway:  ETT      Cuffed: cuffed    Insertion Site:  Oral    ETT Size (mm):  6.5  ADDITIONAL COMMENTS   Limited mouth opening making glidescope entry a little difficult. Atraumatic intubation. Oral dentition as prior.  ----------------------------------------------------------------------------------------------------------------------------------------

## 2017-12-11 NOTE — Discharge Instructions (Signed)
GASTROENTEROLOGY UNIT  DISCHARGE INSTRUCTIONS FOR GASTROSCOPY      12/11/2017                4:44 PM    EGD     Do not drive, operate heavy machinery, drink alcoholic beverages, make important personal or business decisions, or sign legal documents until the next day.    Return to your usual medications   Remain on liquids for 2 days    Things You May Expect:   A mild sore throat (Warm liquids or lozenges will soothe the throat)   You were given medication to help you relax during the test. You need to rest at home for at least 4-6 hours.    You Should Call Your Doctor For Any of the Following:   Fever, abdominal or chest pain that does not go away   Cough or trouble breathing   Bloody or black stools.   Pain or redness at the IV site    If you have a serious problem after hours.  Call [phone] to reach the GI physician on call.    If you are unable to reach your doctor, go to the [facility] Emergency Department.    Follow Up Care:   Follow up in office after 4 weeks.     New Prescriptions    No medications on file

## 2017-12-11 NOTE — Anesthesia Case Conclusion (Signed)
CASE CONCLUSION  Emergence  Actions:  Suctioned, soft bite block and mask supported  Criteria Used for Airway Removal:  Adequate Tv & RR, acceptable O2 saturation, following commands and strong hand grip  Assessment:  Routine  Transport  Directly to: PACU  Airway:  Nasal cannula  Oxygen Delivery:  4 lpm  Position:  Recumbent  Patient Condition on Handoff  Level of Consciousness:  Mildly sedated  Patient Condition:  Stable  Handoff Report to:  RN

## 2017-12-11 NOTE — Procedures (Signed)
Procedure Report      EGD Procedure Note   Date of Procedure: 12/11/2017    Referring Physician: No ref. provider found   Primary Care Provider: Sabino Gasser, MD   Attending Physician: Juline Patch, MD  Fellow: None  Indication(s): gastro cutaneous fistula     Medications: GA were administered incrementally over the course of the procedure to achieve an adequate level of conscious sedation.           Endoscope: [id number] and [id number]      Procedure Description: Full disclosure of risks were reviewed with patient as detailed on the consent form. The patient was placed in the left lateral decubitus position and monitored with continuous pulse oximetry and ECG tracing, interval blood pressure monitoring and direct observations.   After adequate sedation, the endoscope was carefully introduced into the oropharynx and passed in to the esophagus under direct visualization.  The scope was advanced into the stomach.  The stomach showed evidence of classical whipple resection .  The anastomosis to the efferent and afferent limb was wide open.  A guidewire was advanced through the external opening of the gastrocutaneous fistula.  The inner opening of the fistula was identified in the a efferent jejunal limb just beyond the gastrojejunal anastomosis.  The mucosa around the inner opening was ablated with APC at 42 W.  The scope was withdrawn and OVESCO clip was applied to the scope.  The scope was reintroduced into the esophagus and advanced up to the site of inner opening of gastrojejunal fistula.  We were not able to advance the anchor through the scope.  The diagnostic EGD scope was changed to single channel therapeutic upper endoscope.  The OVESCO clip was applied to the therapeutic EGD scope.  The scope was introduced into the esophagus, then advanced up to the site of inner opening of gastrojejunal fistula.  The tissue at rhe site of fistula was sucked into the clip by the use of anchor and OVESCO clip was applied  successfully.  There was no leakage of air from the external opening of the gastrojejunal fistula after application of OVESCO clip.  Patient tolerated the procedure well.    Dr. Ron Agee kindly came to view the inner opening of the fistula.      Intervention(s):   As above    Complication(s):   none    EBL: 3 ml    Impression(s):  1. 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   Follow-up in the office.    Histopathologic Diagnosis:   none    Jachin Coury Foye Spurling, MD

## 2017-12-11 NOTE — Progress Notes (Signed)
Dear Dr. Davonna Belling, Vick Frees, MD,     Amanda Galloway was seen on 12/11/2017 at the [facility] today to address the complaint of right-sided neck and arm pain     History of present illness: Amanda Galloway is a 72 y.o. female with past medical history notable for pancreatic cancer status post chemo and Whipple procedure now cancer free.  [name] had  several month history of right-sided neck and arm pain without injury.  Symptoms came on during hospitalization over the summer and have persisted.  [name] had home OT and PT without change in symptoms.  She has always had some limited range of motion of her neck status post cervical spine surgery 30 years ago for a disc herniation.  Symptoms at that time were on the left side.  She has trouble ranging her right arm.  Denies numbness or weakness.  Pain travels from the right side of her neck to the shoulder and lateral arm to the elbow.    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    []  icing  []  elevation   []  rest  []  heat  []  anti-inflammatories:  []  Tylenol  []  injection:  [x]  PT  []  Brace  []  Chiropractic  []  Massage therapy  []  Acupuncture  []  Other: ___    Past medical history, past surgical history, medications, allergies, social history, family history, and review of systems are per the questionnaire this date reviewed and annotated with the patient and scanned into the chart for reference.       Allergies:  Patient has no known allergies (drug, envir, food or latex).    Current medications:   Current Outpatient Prescriptions   Medication Sig    DULoxetine (CYMBALTA) 20 MG DR capsule Take 2 capsules (40 mg total) by mouth every evening    ferrous sulfate 325 (65 FE) MG tablet Take 1 tablet (325 mg total) by mouth daily (with breakfast)    levothyroxine (SYNTHROID, LEVOTHROID) 150 MCG tablet Take 1 tablet (150 mcg total) by mouth daily (before breakfast)    pantoprazole (PROTONIX) 40 MG EC  tablet Take 1 tablet (40 mg total) by mouth 2 times daily (before meals)   Swallow whole. Do not crush, break, or chew.    pregabalin (LYRICA) 75 MG capsule Take 1 capsule (75 mg total) by mouth 2 times daily   Max daily dose: 150 mg    PREMARIN 0.625 MG tablet Take 1 tablet (0.625 mg total) by mouth every morning    traZODone (DESYREL) 50 MG tablet Take 1 tablet (50 mg total) by mouth nightly    triamterene-hydrochlorothiazide (DYAZIDE) 37.5-25 MG per capsule Take 1 capsule by mouth every morning    clobetasol (TEMOVATE) 0.05 % cream Apply topically 2 times daily   To abdomen rash    Red Yeast Rice Extract (RED YEAST RICE PO) Take by mouth    nystatin (NYSTATIN) powder Apply topically 2 times daily    silver nitrate applicators 84-13 % applicator Apply topically three times a week    docusate sodium (COLACE) 100 MG capsule Take 2 capsules (200 mg total) by mouth 2 times daily    ondansetron (ZOFRAN-ODT) 4 MG disintegrating tablet Take 1 tablet (4 mg total) by mouth 3 times daily as needed (nausea)   Place on top of tongue.    SUMAtriptan (IMITREX) 25 MG tablet Take 1 tablet (25 mg total) by mouth as needed for Migraine  Take at onset of headache. May repeat once in 2 hours.    rollator Use as directed.    Nutritional Supplements (PROSOURCE NO CARB) LIQD liquid Take 30 mLs by mouth 2 times daily (with meals)    senna (SENOKOT) 8.6 MG tablet Take 2 tablets by mouth 2 times daily    Vitamins - senior (CENTRUM SILVER) TABS Take 1 tablet by mouth daily (Patient taking differently: Take 1 tablet by mouth every morning   )       Past medical history:    Past Medical History:   Diagnosis Date    Acute kidney failure 03/31/2017    Cancer     Depression     Fibromyalgia     Hypothyroidism     Pancreatic cancer         Past surgical history:   Past Surgical History:   Procedure Laterality Date    APPENDECTOMY      CERVICAL FUSION      CHOLECYSTECTOMY      CHOLECYSTECTOMY, LAPAROSCOPIC  09/06/2016     HYSTERECTOMY  08/1986    Fibroids    KNEE SURGERY Right     PR INSERT TUNNELED CV CATH WITH PORT Right 10/04/2016    Procedure: Right IJ MEDIPORT Insertion;  Surgeon: Delano Metz, MD;  Location: [facility] MAIN OR;  Service: Oncology General    PR LAP,DIAGNOSTIC ABDOMEN N/A 10/04/2016    Procedure: LAPAROSCOPY DIAGNOSTIC;  Surgeon: Delano Metz, MD;  Location: [facility] MAIN OR;  Service: Oncology General    PR PART [place] PANC,PROX+REMV DUOD+ANAST N/A 03/29/2017    Procedure: WHIPPLE PROCEDURE;  Surgeon: Delano Metz, MD;  Location: [facility] MAIN OR;  Service: Oncology General    ROTATOR CUFF REPAIR Right     TONSILLECTOMY AND ADENOIDECTOMY      whipple           Family history:  Family History   Problem Relation Age of Onset    Breast cancer Mother         died 33    Cancer Father         NHL, died 21    Heart Disease Father     Diabetes Sister     Obesity Sister         4 siblings    No Known Problems Daughter          Social History:     The patient does not have a present or past history of suicide ideation or attempt.   Substance Abuse History: patient denied any past history of illicit substance use.   Employment: Retired      Review of systems: Please refer to patient pain questionnaire  As stated in the history of present illness.  Positive for pancreatic cancer status post Whipple March 29, 2017, cancer associated pain, peripheral neuropathy due to chemotherapy, hypothyroidism, depression  and history of pulmonary embolism  No fever/chills, CP, SOB and all other ROS negative on a 10 point review.    Physical exam:  Vitals: Blood pressure 100/60, height 1.702 m (5\' 7" ), weight 63 kg (139 lb).    General: pleasant  72 y.o., no significant pain behavior, seems comfortable, no distress, able to have conversation   Detailed Musculoskeletal and Neurologic examination performed:    Ext's: no edema, no erythema/warmth  Musculoskeletal: No spasms or clonus present during the exam  Muscle strength is  5/5 of bilateral deltoids,  biceps, triceps, wrist extensors, finger flexors  Myofascial Exam: Tenderness to palpation  right upper trap  The patient ambulates with a nonantalgic gait.  Reflexes 2+ throughout  Neuro: No allodynia, AO x 3  No sensory deficits   Functional shoulder range of motion  Limited cervical range of motion in all planes  Negative Spurling    Previous imaging/diagnostic studies:   None of the cervical spine    Assessment:  Tamaira Ciriello is a 72 y.o. female   with several month history of right-sided neck pain and arm pain with possible cervical radiculopathy despite normal neurological exam.  Patient also has some secondary myofascial pain.    Plan: I will obtain a cervical spine x-ray today.  Patient will be referred to physical therapy for cervical spine stabilization program.  Follow-up in 4-6 weeks.        .      Thank you for allowing [name] to participate in the care of this patient. Please do not hesitate to call us with any questions.        Lenard Simmer, MD 12/11/2017 11:12 AM

## 2017-12-12 ENCOUNTER — Encounter: Payer: Self-pay | Admitting: General Surgery

## 2017-12-16 NOTE — Anesthesia Postprocedure Evaluation (Signed)
Anesthesia Post-Op Note    Patient: Amanda Galloway    Procedure(s) Performed:  Procedure Summary  Date:  12/11/2017 Anesthesia Start: 12/11/2017  2:39 PM Anesthesia Stop: 12/11/2017  4:02 PM Room / Location:  * No operating room entered * / [facility] GI PROCEDURES   * No procedures listed * Diagnosis:  * No pre-op diagnosis entered * * No surgeons listed * Attending Anesthesiologist:  Milon Dikes, MD         Recovery Vitals  No data recorded  Anesthesia type:  Anesthesia type not filed in the log.  Complications Noted During Procedure or in PACU:  None   Comment:    Patient Location:  PACU  Level of Consciousness:    Recovered to baseline  Patient Participation:     Able to participate  Temperature Status:    Normothermic  Oxygen Saturation:    Within patient's normal range  Cardiac Status:   Within patient's normal range  Fluid Status:    Stable  Airway Patency:     Yes  Pulmonary Status:    Baseline  Pain Management:    Adequate analgesia  Nausea and Vomiting:  None    Post Op Assessment:    Tolerated procedure well"   Attending Attestation:  All indicated post anesthesia care provided       -

## 2017-12-17 ENCOUNTER — Encounter: Payer: Self-pay | Admitting: Oncology

## 2017-12-17 NOTE — Telephone Encounter (Signed)
Called pt because of her mychart message. She has not feeling well for several weeks. It feels like someone is sitting on my abdomen. Pain is constant, "A heavy feeling,". Pain is rated a 5/10 right now  Coughing or sneezing causes her a great deal of pain and pain increases.  She feels like a hard time breathing sometimes because her abd feels heavy and she feels alike it an effort to expand her lungs. She is able to function at home but able to take stairs.Is able to gog to Gym 3 times a day (one hour on treadmill and 6 different weight machines).  Is able to eat anything that she wants, no pressure, Regular BMs.  No fevers.  Has been taking advil and excedrin together when the pain gets to what she describes as unbearable.    Paged out to [place]

## 2017-12-17 NOTE — Telephone Encounter (Signed)
Pt returned your call.  

## 2017-12-19 NOTE — Progress Notes (Deleted)
[facility]-GI Surgery Follow up note    Chief Complaint  Jeliyah Middlebrooks is a 72 y.o. female who presents  on 12/19/2017 for [facility]-GI follow-up care.     Diagnosis  Locally advanced Pancreatic adenocarinoma  Whipple on 03/29/2017    Physician Team  Surgeon:  Delano Metz, MD  PCP:  Celesta Aver, MD  Gastroenterologist: Fonnie Jarvis, MD  Medical Oncology: Calton Dach, MD   Radiation Oncologist: Durene Romans, MD    Interval History    Oncologic History   Over several months, patient noted worsening abdominal pain and weight loss. Extensive work-up including EGD, colonoscopy and imaging were unrevealing. After suggestive HIDA scan, she underwent cholecystectomy on September 06, 2016 but symptoms persisted.      MRCP on September 25, 2016 demonstrated2x2cm soft tissue mass-like abnormality in celiac region, contiguous with medial margin of uncinate process of pancreas concerning for pancreatic lession vs lymphadenopathy.     EUS/FNA on September 27, 2016 demonstrated a mass in pancreas uncinate process and lymph node pressing celiac axis. FNA confirmed adenocarcinoma.     She was transferred to [facility] for additional management. Abd/pelvis CT on September 30, 2016 revealed ill-defined prominent retroperitoneal soft tissue with central low attenuation suggestive of necrosis which abuts SMA and is adjacent to pancreatic uncinate process similar to prior MRI. Differential includes pancreatic neoplasm versus lymphadenopathy. CT angio on September 29, 2016 showed no pulmonary embolism. Nonspecific 3 mm nodules in right upper and middle lobe, recommend attention on follow-up imaging.      Diagnostic laparoscopy and port placement by Dr. Ron Agee on October 04, 2016 - no evidence of metastatic disease.     FOLFIRINOX chemotherapy - treatment start date 10/05/2016. Completed 8 cycles.     Neoadjuvant SBRT 25 Gy in 5 fractions pancreatic cancer, with concurrent boost to 30 Gy in 5 fractions to tumor around SMA from 02/13/17-03/08/17     She  underwent a Whipple procedure with Dr. Delano Metz, MD on 03/29/17.  She was briefly admitted to SICU post operatively for hemodynamic monitoring and the expectation of complicated pain management given patient on home methadone. 1/31 NGT placed for nausea/vomiting. TPN started 2/6. Multiple perc drains placed for fluid collections throughout her stay. On 04/16/17 patient had a PTC drain placed by IR and was transferred back to SICU for increased WOB and was intubated for worsening respiratory status. Chest tube placed 4/5 for left pleural effusion. 4/12 PEG tube placed for poor PO intake. 4/19 gastric perforation from PEG site. 6/7 downsizing of PEG d/t peristomal leakage. 6/13 positive for H. Pylori. Patient's hospital course was also been complicated by constipation, malnutrition, pain control, depression and fluid overload. She was readmitted several times to SICU during her stay.  Palliative care followed patient throughout her hospital stay to assist with pain management, anxiety, depression. All of the patient's drains were removed prior to going home. Her PICC line was removed as well. She was discharged from the hospital on 08/24/17 to home.      She was readmitted from clinic on 09/18/17-09/25/17 for  failure to thrive due to chronic nausea/vomiting, poor PO intake with suspected gastric outlet obstruction vs gastroparesis. She was placed on TPN for several days while inpatient as she needed to be NPO for various imaging studies. She had several imaging series with GI, which indicated her PEG tube was the cause of vomiting and discomfort. Her IR placed PEG tube was removed 7/22. She tolerated the procedure well with no immediate complications. Pain and nausea  immediately resolved.      Medical History  Past Medical History:   Diagnosis Date    Acute kidney failure 03/31/2017    Cancer     Depression     Fibromyalgia     Hypothyroidism     Pancreatic cancer      Surgical History  Past Surgical History:    Procedure Laterality Date    APPENDECTOMY      CERVICAL FUSION      CHOLECYSTECTOMY      CHOLECYSTECTOMY, LAPAROSCOPIC  09/06/2016    HYSTERECTOMY  08/1986    Fibroids    KNEE SURGERY Right     PR INSERT TUNNELED CV CATH WITH PORT Right 10/04/2016    Procedure: Right IJ MEDIPORT Insertion;  Surgeon: Delano Metz, MD;  Location: [facility] MAIN OR;  Service: Oncology General    PR LAP,DIAGNOSTIC ABDOMEN N/A 10/04/2016    Procedure: LAPAROSCOPY DIAGNOSTIC;  Surgeon: Delano Metz, MD;  Location: [facility] MAIN OR;  Service: Oncology General    PR PART [place] PANC,PROX+REMV DUOD+ANAST N/A 03/29/2017    Procedure: WHIPPLE PROCEDURE;  Surgeon: Delano Metz, MD;  Location: [facility] MAIN OR;  Service: Oncology General    ROTATOR CUFF REPAIR Right     TONSILLECTOMY AND ADENOIDECTOMY      whipple       Family History  Family History   Problem Relation Age of Onset    Breast cancer Mother         died 53    Cancer Father         NHL, died 34    Heart Disease Father     Diabetes Sister     Obesity Sister         4 siblings    No Known Problems Daughter      Social History  Social History     Socioeconomic History    Marital status: Divorced     Spouse name: Not on file    Number of children: Not on file    Years of education: Not on file    Highest education level: Not on file   Occupational History    Not on file   Tobacco Use    Smoking status: Never Smoker    Smokeless tobacco: Never Used   Substance and Sexual Activity    Alcohol use: No    Drug use: No    Sexual activity: Never     Partners: Male   Social History Narrative    Lives with daughter in [place]    Previously Glass blower/designer for [employer], now retired    Ex-husband in [place]       Medications  Outpatient Encounter Medications as of 12/23/2017   Medication Sig Dispense Refill    DULoxetine (CYMBALTA) 20 MG DR capsule Take 2 capsules (40 mg total) by mouth every evening 180 capsule 6    ferrous sulfate 325 (65 FE) MG tablet Take 1 tablet (325 mg  total) by mouth daily (with breakfast) 100 tablet 3    levothyroxine (SYNTHROID, LEVOTHROID) 150 MCG tablet Take 1 tablet (150 mcg total) by mouth daily (before breakfast) 90 tablet 6    pantoprazole (PROTONIX) 40 MG EC tablet Take 1 tablet (40 mg total) by mouth 2 times daily (before meals)   Swallow whole. Do not crush, break, or chew. 60 tablet 0    pregabalin (LYRICA) 75 MG capsule Take 1 capsule (75 mg total) by mouth 2 times daily   Max daily  dose: 150 mg 60 capsule 1    PREMARIN 0.625 MG tablet Take 1 tablet (0.625 mg total) by mouth every morning 90 tablet 3    triamterene-hydrochlorothiazide (DYAZIDE) 37.5-25 MG per capsule Take 1 capsule by mouth every morning 30 capsule 5    clobetasol (TEMOVATE) 0.05 % cream Apply topically 2 times daily   To abdomen rash 45 g 0    Red Yeast Rice Extract (RED YEAST RICE PO) Take by mouth      nystatin (NYSTATIN) powder Apply topically 2 times daily 15 g 0    silver nitrate applicators 42-68 % applicator Apply topically three times a week 100 each 0    docusate sodium (COLACE) 100 MG capsule Take 2 capsules (200 mg total) by mouth 2 times daily 120 capsule 5    ondansetron (ZOFRAN-ODT) 4 MG disintegrating tablet Take 1 tablet (4 mg total) by mouth 3 times daily as needed (nausea)   Place on top of tongue. 30 tablet 0    SUMAtriptan (IMITREX) 25 MG tablet Take 1 tablet (25 mg total) by mouth as needed for Migraine   Take at onset of headache. May repeat once in 2 hours. 9 tablet 1    rollator Use as directed. 1 each 0    Nutritional Supplements (PROSOURCE NO CARB) LIQD liquid Take 30 mLs by mouth 2 times daily (with meals)      senna (SENOKOT) 8.6 MG tablet Take 2 tablets by mouth 2 times daily 120 tablet 5    Vitamins - senior (CENTRUM SILVER) TABS Take 1 tablet by mouth daily (Patient taking differently: Take 1 tablet by mouth every morning   ) 30 tablet 0     No facility-administered encounter medications on file as of 12/23/2017.       Allergies  No  Known Allergies (drug, envir, food or latex)     Review of Systems  Review of Systems   Constitutional: Negative for chills, fever, malaise/fatigue and weight loss.   HENT: Negative for congestion and sore throat.    Eyes: Negative for blurred vision and redness.   Respiratory: Negative for cough, hemoptysis, sputum production and shortness of breath.    Cardiovascular: Negative for chest pain, palpitations and leg swelling.   Gastrointestinal: Negative for abdominal pain, blood in stool, constipation, diarrhea, heartburn, melena, nausea and vomiting.   Genitourinary: Negative for dysuria, flank pain and frequency.   Musculoskeletal: Negative for back pain, joint pain and myalgias.   Skin: Negative for itching and rash.   Neurological: Negative for dizziness, focal weakness and seizures.   Psychiatric/Behavioral: Negative for depression.     Physical Exam  There were no vitals taken for this visit.     Physical Exam   Constitutional: She is oriented to person, place, and time. She appears well-developed and well-nourished. No distress.   HENT:   Head: Normocephalic.   Mouth/Throat: No oropharyngeal exudate.   Eyes: Pupils are equal, round, and reactive to light. Conjunctivae and EOM are normal. Right eye exhibits no discharge. Left eye exhibits no discharge. No scleral icterus.   Neck: Normal range of motion.   Cardiovascular: Normal rate, regular rhythm, normal heart sounds and intact distal pulses. Exam reveals no gallop and no friction rub.   No murmur heard.  Pulmonary/Chest: Effort normal and breath sounds normal. No respiratory distress. She has no wheezes. She has no rales.   Abdominal: Soft. Bowel sounds are normal. She exhibits no distension and no mass. There is no tenderness. There  is no rebound and no guarding.     Musculoskeletal:         General: No edema.     Lymphadenopathy:     She has no cervical adenopathy.   Neurological: She is alert and oriented to person, place, and time.   Skin: Skin is warm  and dry. No rash noted. She is not diaphoretic. No erythema. No pallor.   Psychiatric: She has a normal mood and affect. Her behavior is normal. Judgment and thought content normal.     Laboratory Data    Pathology  [phone]     FINAL DIAGNOSIS:   (A) Soft tissue, perineural tissue surrounding SMA, biopsy:   - Benign fibroadipose tissue.   - One lymph node, negative for carcinoma (0/1).     (B) Pancreas, Duodenum, Stomach, Pancreaticoduodenectomy (Whipple   resection):   - A small focus (0.2 cm) of high-grade pancreatic intraepithelial   neoplasia.     - No invasive carcinoma identified.     - Background pancreas with chronic pancreatitis and islet cell   hyperplasia.     - Margins negative for dysplasia or carcinoma.     - Stomach with mild inactive chronic gastritis; negative for H.   pylori on H&E.   - Duodenum with no significant pathologic abnormality.     - Twenty-one lymph nodes, negative for carcinoma (0/21).     - See comment.     (C) Pancreas, uncinate margin, resection:   - Cauterized pancreatic parenchyma and fibroadipose tissue; negative   for carcinoma.   - One lymph node, negative for carcinoma (0/1).     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     Imaging Data  CT abdomen and pelvis without and with contrast   /11/2017   1. Interval resolution of intrahepatic fluid collection. The previously visualized subcapsular/perihepatic fluid collection along the lateral margin of the right hepatic lobe is similar to before but in a slightly different distribution. No new subcapsular or perihepatic fluid collections. 2. Skin retraction and skin thickening prior G-tube tract site of left upper abdomen. No definitive evidence of enterocutaneous fistulous tract at this site. 3. Status post Whipple procedure. Expected pneumobilia. The remaining pancreas is atrophic. 4.  Interval resolution of bilateral pleural effusions and associated compressive atelectasis.         Assessment  Ishika Chesterfield is a 72 y.o. female with a history of malignant neoplasm of the pancreas who underwent a Whipple procedure with Dr. Delano Metz, MD on 03/29/17.  She had a complicated hospital stay which required multiple drains, multiple readmissions to the ICU, TPN, a PEG tube, and multiple scans. She was discharged on 08/24/17. =      Plan       Rodrigo Ran, NP

## 2017-12-20 ENCOUNTER — Telehealth: Payer: Self-pay | Admitting: Gastroenterology

## 2017-12-20 NOTE — Telephone Encounter (Signed)
Called and left message to call back.

## 2017-12-20 NOTE — Telephone Encounter (Signed)
Copied from [place]. Topic: Return Call - Speak to Provider/Office Staff  >> Dec 20, 2017 11:30 AM Alferd Patee wrote:  Patient is calling and states she was told Dr. Foye Spurling was trying to get in touch with her. Writer does not see documentation of this. She can be reached back at [phone].

## 2017-12-23 ENCOUNTER — Ambulatory Visit: Payer: Medicare (Managed Care) | Attending: Surgical Oncology | Admitting: Surgical Oncology

## 2017-12-23 VITALS — BP 100/48 | HR 74 | Temp 97.7°F | Resp 16 | Ht 67.01 in | Wt 140.0 lb

## 2017-12-23 DIAGNOSIS — C259 Malignant neoplasm of pancreas, unspecified: Secondary | ICD-10-CM

## 2017-12-23 DIAGNOSIS — K316 Fistula of stomach and duodenum: Secondary | ICD-10-CM

## 2017-12-23 NOTE — Progress Notes (Signed)
HPB-GI Surgery Follow up note    Chief Complaint  Amanda Galloway is a 72 y.o. female who presents  on 12/23/2017 for HPB-GI follow-up care.     Diagnosis  Locally advanced Pancreatic adenocarinoma  Whipple on 03/29/2017    Physician Team  Surgeon:  Delano Metz, MD  PCP:  Celesta Aver, MD  Gastroenterologist: Fonnie Jarvis, MD  Medical Oncology: Calton Dach, MD   Radiation Oncologist: Durene Romans, MD    Interval History  Underwent     Oncologic History   Over several months, patient noted worsening abdominal pain and weight loss. Extensive work-up including EGD, colonoscopy and imaging were unrevealing. After suggestive HIDA scan, she underwent cholecystectomy on September 06, 2016 but symptoms persisted.      MRCP on September 25, 2016 demonstrated2x2cm soft tissue mass-like abnormality in celiac region, contiguous with medial margin of uncinate process of pancreas concerning for pancreatic lession vs lymphadenopathy.     EUS/FNA on September 27, 2016 demonstrated a mass in pancreas uncinate process and lymph node pressing celiac axis. FNA confirmed adenocarcinoma.     She was transferred to [facility] for additional management. Abd/pelvis CT on September 30, 2016 revealed ill-defined prominent retroperitoneal soft tissue with central low attenuation suggestive of necrosis which abuts SMA and is adjacent to pancreatic uncinate process similar to prior MRI. Differential includes pancreatic neoplasm versus lymphadenopathy. CT angio on September 29, 2016 showed no pulmonary embolism. Nonspecific 3 mm nodules in right upper and middle lobe, recommend attention on follow-up imaging.      Diagnostic laparoscopy and port placement by Dr. Ron Agee on October 04, 2016 - no evidence of metastatic disease.     FOLFIRINOX chemotherapy - treatment start date 10/05/2016. Completed 8 cycles.     Neoadjuvant SBRT 25 Gy in 5 fractions pancreatic cancer, with concurrent boost to 30 Gy in 5 fractions to tumor around SMA from  02/13/17-03/08/17     She underwent a Whipple procedure with Dr. Delano Metz, MD on 03/29/17.  She was briefly admitted to SICU post operatively for hemodynamic monitoring and the expectation of complicated pain management given patient on home methadone. 1/31 NGT placed for nausea/vomiting. TPN started 2/6. Multiple perc drains placed for fluid collections throughout her stay. On 04/16/17 patient had a PTC drain placed by IR and was transferred back to SICU for increased WOB and was intubated for worsening respiratory status. Chest tube placed 4/5 for left pleural effusion. 4/12 PEG tube placed for poor PO intake. 4/19 gastric perforation from PEG site. 6/7 downsizing of PEG d/t peristomal leakage. 6/13 positive for H. Pylori. Patient's hospital course was also been complicated by constipation, malnutrition, pain control, depression and fluid overload. She was readmitted several times to SICU during her stay.  Palliative care followed patient throughout her hospital stay to assist with pain management, anxiety, depression. All of the patient's drains were removed prior to going home. Her PICC line was removed as well. She was discharged from the hospital on 08/24/17 to home.      She was readmitted from clinic on 09/18/17-09/25/17 for  failure to thrive due to chronic nausea/vomiting, poor PO intake with suspected gastric outlet obstruction vs gastroparesis. She was placed on TPN for several days while inpatient as she needed to be NPO for various imaging studies. She had several imaging series with GI, which indicated her PEG tube was the cause of vomiting and discomfort. Her IR placed PEG tube was removed 7/22. She tolerated the procedure well with no immediate complications.  Pain and nausea immediately resolved.     At subsequent follow up visits noted to have continuous leakage from prior PEG site.  Local treatment with silver nitrate with temporary resolution.  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  Referred to GI for endoscopic treatment.       Medical History  Past Medical History:   Diagnosis Date    Acute kidney failure 03/31/2017    Cancer     Depression     Fibromyalgia     Hypothyroidism     Pancreatic cancer      Surgical History  Past Surgical History:   Procedure Laterality Date    APPENDECTOMY      CERVICAL FUSION      CHOLECYSTECTOMY      CHOLECYSTECTOMY, LAPAROSCOPIC  09/06/2016    HYSTERECTOMY  08/1986    Fibroids    KNEE SURGERY Right     PR INSERT TUNNELED CV CATH WITH PORT Right 10/04/2016    Procedure: Right IJ MEDIPORT Insertion;  Surgeon: Delano Metz, MD;  Location: [facility] MAIN OR;  Service: Oncology General    PR LAP,DIAGNOSTIC ABDOMEN N/A 10/04/2016    Procedure: LAPAROSCOPY DIAGNOSTIC;  Surgeon: Delano Metz, MD;  Location: [facility] MAIN OR;  Service: Oncology General    PR PART [place] PANC,PROX+REMV DUOD+ANAST N/A 03/29/2017    Procedure: WHIPPLE PROCEDURE;  Surgeon: Delano Metz, MD;  Location: [facility] MAIN OR;  Service: Oncology General    ROTATOR CUFF REPAIR Right     TONSILLECTOMY AND ADENOIDECTOMY      whipple       Family History  Family History   Problem Relation Age of Onset    Breast cancer Mother         died 110    Cancer Father         NHL, died 28    Heart Disease Father     Diabetes Sister     Obesity Sister         4 siblings    No Known Problems Daughter      Social History  Social History     Socioeconomic History    Marital status: Divorced     Spouse name: Not on file    Number of children: Not on file    Years of education: Not on file    Highest education level: Not on file   Occupational History    Not on file   Tobacco Use    Smoking status: Never Smoker    Smokeless tobacco: Never Used   Substance and Sexual Activity    Alcohol use: No    Drug use: No    Sexual activity: Never     Partners: Male   Social History Narrative    Lives with daughter in [place]    Previously Glass blower/designer for [employer], now retired     Ex-husband in [place]       Medications  Outpatient Encounter Medications as of 12/23/2017   Medication Sig Dispense Refill    DULoxetine (CYMBALTA) 20 MG DR capsule Take 2 capsules (40 mg total) by mouth every evening 180 capsule 6    ferrous sulfate 325 (65 FE) MG tablet Take 1 tablet (325 mg total) by mouth daily (with breakfast) 100 tablet 3    levothyroxine (SYNTHROID, LEVOTHROID) 150 MCG tablet Take 1 tablet (150 mcg total) by mouth daily (before breakfast) 90 tablet 6    pantoprazole (PROTONIX) 40 MG EC tablet Take 1 tablet (  40 mg total) by mouth 2 times daily (before meals)   Swallow whole. Do not crush, break, or chew. 60 tablet 0    pregabalin (LYRICA) 75 MG capsule Take 1 capsule (75 mg total) by mouth 2 times daily   Max daily dose: 150 mg 60 capsule 1    PREMARIN 0.625 MG tablet Take 1 tablet (0.625 mg total) by mouth every morning 90 tablet 3    triamterene-hydrochlorothiazide (DYAZIDE) 37.5-25 MG per capsule Take 1 capsule by mouth every morning 30 capsule 5    clobetasol (TEMOVATE) 0.05 % cream Apply topically 2 times daily   To abdomen rash 45 g 0    Red Yeast Rice Extract (RED YEAST RICE PO) Take by mouth      nystatin (NYSTATIN) powder Apply topically 2 times daily 15 g 0    silver nitrate applicators 89-38 % applicator Apply topically three times a week 100 each 0    docusate sodium (COLACE) 100 MG capsule Take 2 capsules (200 mg total) by mouth 2 times daily 120 capsule 5    ondansetron (ZOFRAN-ODT) 4 MG disintegrating tablet Take 1 tablet (4 mg total) by mouth 3 times daily as needed (nausea)   Place on top of tongue. 30 tablet 0    SUMAtriptan (IMITREX) 25 MG tablet Take 1 tablet (25 mg total) by mouth as needed for Migraine   Take at onset of headache. May repeat once in 2 hours. 9 tablet 1    rollator Use as directed. 1 each 0    Nutritional Supplements (PROSOURCE NO CARB) LIQD liquid Take 30 mLs by mouth 2 times daily (with meals)      senna (SENOKOT) 8.6 MG tablet Take 2  tablets by mouth 2 times daily 120 tablet 5    Vitamins - senior (CENTRUM SILVER) TABS Take 1 tablet by mouth daily (Patient taking differently: Take 1 tablet by mouth every morning   ) 30 tablet 0     No facility-administered encounter medications on file as of 12/23/2017.       Allergies  No Known Allergies (drug, envir, food or latex)     Review of Systems  Review of Systems   Constitutional: Negative for chills, fever, malaise/fatigue and weight loss.   HENT: Negative for congestion and sore throat.    Eyes: Negative for blurred vision and redness.   Respiratory: Negative for cough, hemoptysis, sputum production and shortness of breath.    Cardiovascular: Negative for chest pain, palpitations and leg swelling.   Gastrointestinal: Negative for abdominal pain, blood in stool, constipation, diarrhea, heartburn, melena, nausea and vomiting.   Genitourinary: Negative for dysuria, flank pain and frequency.   Musculoskeletal: Negative for back pain, joint pain and myalgias.   Skin: Negative for itching and rash.   Neurological: Negative for dizziness, focal weakness and seizures.   Psychiatric/Behavioral: Negative for depression.     Physical Exam  BP (!) 100/48 (BP Location: Left arm, Patient Position: Sitting, Cuff Size: adult)    Pulse 74    Temp 36.5 C (97.7 F) (Temporal)    Resp 16    Ht 170.2 cm (5' 7.01")    Wt 63.5 kg (139 lb 15.9 oz)    SpO2 98%    BMI 21.92 kg/m      Physical Exam   Constitutional: She is oriented to person, place, and time. She appears well-developed and well-nourished. No distress.   HENT:   Head: Normocephalic.   Mouth/Throat: No oropharyngeal exudate.  Eyes: Pupils are equal, round, and reactive to light. Conjunctivae and EOM are normal. Right eye exhibits no discharge. Left eye exhibits no discharge. No scleral icterus.   Neck: Normal range of motion.   Cardiovascular: Normal rate, regular rhythm, normal heart sounds and intact distal pulses. Exam reveals no gallop and no friction  rub.   No murmur heard.  Pulmonary/Chest: Effort normal and breath sounds normal. No respiratory distress. She has no wheezes. She has no rales.   Abdominal: Soft. Bowel sounds are normal. She exhibits no distension and no mass. There is no tenderness. There is no rebound and no guarding.     Musculoskeletal:         General: No edema.     Lymphadenopathy:     She has no cervical adenopathy.   Neurological: She is alert and oriented to person, place, and time.   Skin: Skin is warm and dry. No rash noted. She is not diaphoretic. No erythema. No pallor.   Psychiatric: She has a normal mood and affect. Her behavior is normal. Judgment and thought content normal.     Laboratory Data    Pathology  [phone]     FINAL DIAGNOSIS:   (A) Soft tissue, perineural tissue surrounding SMA, biopsy:   - Benign fibroadipose tissue.   - One lymph node, negative for carcinoma (0/1).     (B) Pancreas, Duodenum, Stomach, Pancreaticoduodenectomy (Whipple   resection):   - A small focus (0.2 cm) of high-grade pancreatic intraepithelial   neoplasia.     - No invasive carcinoma identified.     - Background pancreas with chronic pancreatitis and islet cell   hyperplasia.     - Margins negative for dysplasia or carcinoma.     - Stomach with mild inactive chronic gastritis; negative for H.   pylori on H&E.   - Duodenum with no significant pathologic abnormality.     - Twenty-one lymph nodes, negative for carcinoma (0/21).     - See comment.     (C) Pancreas, uncinate margin, resection:   - Cauterized pancreatic parenchyma and fibroadipose tissue; negative   for carcinoma.   - One lymph node, negative for carcinoma (0/1).     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     Imaging Data    EGD Procedure Note   Date of Procedure: 12/11/2017     Impression(s):  1. Successful closure of the opening of  gastro cutaneous fistula   with OVESCo clip     Assessment  Amanda Galloway is a 72 y.o. female with a history of malignant neoplasm of the pancreas who underwent a Whipple procedure with Dr. Delano Metz, MD on 03/29/17.  She had a complicated hospital stay which required multiple drains, multiple readmissions to the ICU, TPN, a PEG tube, and multiple scans. She was discharged on 08/24/17. She now has a non-healing gastrostomy site. Small area of drainage daily.   Will consider endoscopic closure. Will send to GI for complex procedure.   Will plan for surveillance imaging in 72-month.         JArty Baumgartner NP    [facility]B Attending:   The patient was examined and counseled by both Ms. GAutumn Pattyand me simultaneously. The above note was documented by Ms. PPosey Prontobut reflects my evaluation and dictation.         EDelano Metz MD

## 2017-12-24 NOTE — Telephone Encounter (Signed)
Office appointment after 4 weeks

## 2017-12-25 ENCOUNTER — Telehealth: Payer: Self-pay | Admitting: Surgery

## 2017-12-25 ENCOUNTER — Ambulatory Visit: Payer: Medicare (Managed Care) | Admitting: Rehabilitative and Restorative Service Providers"

## 2017-12-25 DIAGNOSIS — C259 Malignant neoplasm of pancreas, unspecified: Secondary | ICD-10-CM

## 2017-12-25 MED ORDER — PANCRELIPASE (LIP-PROT-AMYL) 12000 UNIT PO CPEP *I*
1.0000 | DELAYED_RELEASE_CAPSULE | Freq: Three times a day (TID) | ORAL | 6 refills | Status: DC
Start: 2017-12-25 — End: 2017-12-31

## 2017-12-25 NOTE — Telephone Encounter (Signed)
Spoke with patient   Had sweats last evening and felt very cold.  Did not take temperature  Symptoms resolved and did not have recurrent episode today.   Feels unsteady on her feet. Feeling weak.  No falls, nausea or emesis.  Scant amount of drainage from [facility] fistula site.   Has not taken Dilaudid for past 24 hours  A/P:  72 year old female pancreatic cancer s/p Whipple 03/29/2017 w/ protracted postoperative course w/ eventual discharge in July 2019.  Doing really well since discharge. Imaging in September w/o evidence of recurrent disease.    Will check a set of labs.    Advised to get thermometer and check temp if recurrent episode.  Call office if 102 or greater  Encouraged rest today.    Fall precautions reviewed.   Will follow via telephone

## 2017-12-25 NOTE — Telephone Encounter (Signed)
Patient experienced a fever last night (the first since surgery).  Also checking in the status of a medication ordered during 12/23/17 office visit.

## 2017-12-25 NOTE — Telephone Encounter (Signed)
Pt is scheduled and notified for 11.18

## 2017-12-27 ENCOUNTER — Telehealth: Payer: Self-pay | Admitting: General Surgery

## 2017-12-27 ENCOUNTER — Other Ambulatory Visit
Admission: RE | Admit: 2017-12-27 | Discharge: 2017-12-27 | Disposition: A | Discharge status: Home / Self Care | Payer: Medicare (Managed Care) | Source: Ambulatory Visit | Attending: Surgery | Admitting: Surgery

## 2017-12-27 DIAGNOSIS — C259 Malignant neoplasm of pancreas, unspecified: Secondary | ICD-10-CM | POA: Insufficient documentation

## 2017-12-27 LAB — COMPREHENSIVE METABOLIC PANEL
ALT: 45 U/L — ABNORMAL HIGH (ref 0–35)
AST: 57 U/L — ABNORMAL HIGH (ref 0–35)
Albumin: 4 g/dL (ref 3.5–5.2)
Alk Phos: 438 U/L — ABNORMAL HIGH (ref 35–105)
Anion Gap: 12 (ref 7–16)
Bilirubin,Total: <0.2 mg/dL (ref 0.0–1.2)
CO2: 27 mmol/L (ref 20–28)
Calcium: 9 mg/dL (ref 8.6–10.2)
Chloride: 101 mmol/L (ref 96–108)
Creatinine: 1.02 mg/dL — ABNORMAL HIGH (ref 0.51–0.95)
GFR,Black: 64 *
GFR,Caucasian: 55 * — AB
Glucose: 92 mg/dL (ref 60–99)
Lab: 22 mg/dL — ABNORMAL HIGH (ref 6–20)
Potassium: 3.9 mmol/L (ref 3.3–5.1)
Sodium: 140 mmol/L (ref 133–145)
Total Protein: 7.2 g/dL (ref 6.3–7.7)

## 2017-12-27 LAB — CBC AND DIFFERENTIAL
Baso # K/uL: 0.1 10*3/uL (ref 0.0–0.1)
Basophil %: 1.1 %
Eos # K/uL: 0.1 10*3/uL (ref 0.0–0.4)
Eosinophil %: 2.1 %
Hematocrit: 37 % (ref 34–45)
Hemoglobin: 11.6 g/dL (ref 11.2–15.7)
IMM Granulocytes #: 0 10*3/uL
IMM Granulocytes: 0.2 %
Lymph # K/uL: 1.5 10*3/uL (ref 1.2–3.7)
Lymphocyte %: 28.1 %
MCH: 28 pg/cell (ref 26–32)
MCHC: 31 g/dL — ABNORMAL LOW (ref 32–36)
MCV: 91 fL (ref 79–95)
Mono # K/uL: 0.5 10*3/uL (ref 0.2–0.9)
Monocyte %: 10 %
Neut # K/uL: 3.1 10*3/uL (ref 1.6–6.1)
Nucl RBC # K/uL: 0 10*3/uL (ref 0.0–0.0)
Nucl RBC %: 0 /100 WBC (ref 0.0–0.2)
Platelets: 267 10*3/uL (ref 160–370)
RBC: 4.1 MIL/uL (ref 3.9–5.2)
RDW: 15.5 % — ABNORMAL HIGH (ref 11.7–14.4)
Seg Neut %: 58.5 %
WBC: 5.3 10*3/uL (ref 4.0–10.0)

## 2017-12-27 MED ORDER — METOCLOPRAMIDE HCL 5 MG PO TABS *I*
5.0000 mg | ORAL_TABLET | Freq: Four times a day (QID) | ORAL | 0 refills | Status: DC
Start: 2017-12-27 — End: 2018-01-20

## 2017-12-27 NOTE — Telephone Encounter (Signed)
Amanda Galloway called to report she is having no improvement after taking CREON for 2 days

## 2017-12-27 NOTE — Telephone Encounter (Signed)
Spoke with patient.     Doing okay. No for fevers or chills. Feelig okay. Does feel a tight band around her. Eating feeling of fullness and food feels like is sitting in "my stomach for a long time". No nausea or vomiting. Otherwise doing okay.     A/p: Instructed patient to get labs. Reglan QID. If symptoms persist can consider upper GI and SB follow thought to check for stricture as patient had issues with this in the past (though related the feeding tube which was removed).

## 2017-12-31 ENCOUNTER — Telehealth: Payer: Self-pay | Admitting: Surgery

## 2017-12-31 DIAGNOSIS — R140 Abdominal distension (gaseous): Secondary | ICD-10-CM

## 2017-12-31 DIAGNOSIS — Z9889 Other specified postprocedural states: Secondary | ICD-10-CM

## 2017-12-31 MED ORDER — PANCRELIPASE (LIP-PROT-AMYL) 24000 UNIT PO CPEP *I*
DELAYED_RELEASE_CAPSULE | ORAL | 6 refills | Status: DC
Start: 2017-12-31 — End: 2018-08-26

## 2017-12-31 NOTE — Telephone Encounter (Signed)
Pt. Called to state her pharmacy has been trying to contact us regarding her Reglan. Called [facility] and they need to speak to you about the drug interaction between the Reglan and Cymbalta, pharmacy # is [phone]. Shela Leff can be reached at [phone].

## 2017-12-31 NOTE — Telephone Encounter (Signed)
Will hold off on Reglan.  Increase dose of Creon. New script sent.  Upper GI SBFT ordered.   Patient aware.   Follow up after test.

## 2018-01-05 ENCOUNTER — Other Ambulatory Visit: Payer: Self-pay | Admitting: Internal Medicine

## 2018-01-09 ENCOUNTER — Ambulatory Visit
Payer: Medicare (Managed Care) | Attending: Physical Medicine and Rehabilitation | Admitting: Rehabilitative and Restorative Service Providers"

## 2018-01-09 ENCOUNTER — Other Ambulatory Visit: Payer: Self-pay

## 2018-01-09 DIAGNOSIS — M5412 Radiculopathy, cervical region: Secondary | ICD-10-CM | POA: Insufficient documentation

## 2018-01-09 NOTE — Telephone Encounter (Signed)
Patient is completely out

## 2018-01-09 NOTE — Progress Notes (Signed)
[facility] REHAB[facility]ITATION / PHYSICAL THERAPY  CERVICAL THORACIC SPINE EVALUATION      Diagnosis: cervical radiculopathy RUE pain  Onset date: 1 year  Date of surgery: NA    SUBJECTIVE  Patient is a 72 y.o. female who is present today for cervical  care.  Hx of neck pain herniated disc and had radiating pain down the left arm.  States she was in the hospital for 6 months early this year;  She reports pain started to travel from her neck down to her arm.  Mechanism of injury/history of symptoms: No specific cause     Nature of Symptoms  Symptom location: cervical and arm  Relevant symptoms: Throbbing, Pain   Symptom frequency: Intermittent  Symptom intensity (0 - 10 scale): Now 10 Best 10 Worst 10   Symptoms worsen with: tilting head back  Symptoms improve with: holding arm in guarded position  Assistive device:  none    Medical Screen  Within the last 3 months, patient reports: no constitutional symptoms, no falls / trauma  History of Cancer: Yes  History of Smoking: No   Prior history of neck pain.  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  The patient was able to perform basic bed mobility independent.    Palpation: Generalized Cervical / Scapular pain, Suboccipital region, Upper trapezius, Superior Scapular border, Anterior Scalenes and Hypomobile    Segmental Mobility  Assessment:  Cervical: hypomobile  Thoracic: hypomobile    Neurologic:  Sensation:  UE  Light touch, Intact to gross screen  Myotomes:  UE Intact to gross screen    ROM Loss and Movement Quality  Flexion: moderate loss, Painful  Extension:  significant loss, Painful  Left Sidebend: moderate loss, Painful  Right Sidebend:  moderate loss, Painful  Left Rotation:  minor loss, Painful  Right Rotation:  minor loss, Painful    Directional Preference  Sitting Correction: symptoms reduced:  yes  Repeated retraction had no effect  symptoms in neck.    Cervical Special Tests  Distraction, positive  Spurlings compression (positive    Neurodynamics      UE Neuromobility Tests: Median: Restricted right  Ulnar:   Normal  bilateral                                          Dynamic Stability Tests  Deep Neck Flexor Endurance Test: fail  5-10 seconds      ASSESSMENT  Findings consistent with 72 y.o. female with cervical radiculopathy with secondary myofascial pain with pain, ROM limitations, strength limitations, functional limitations.     Based on clinical evaluation and assessment, the primary rehabilitation approach will include symptom modulation, movement control, functional optimization.    Personal factors affecting treatment/recovery:   Advanced age  Recent hospital admission  Comorbidities affecting treatment/recovery:  Nervous   Cancer associated pain    Peripheral neuropathy due to chemotherapy    Digestive   Pancreatic adenocarcinoma s/p Whipple 03/29/17    Malignant neoplasm of head of pancreas      Clinical presentation:   evolving  Patient complexity:     moderate level as indicated by above stability of condition, personal factors, environmental factors and comorbidities in addition to patient symptom presentation and impairments found on physical exam.    Prognosis:  Good    Contraindications/Precautions/Limitation:  Per diagnosis    Short Term Goals:2week(s) Decrease c/o max pain to < 4/10 and Minimal  assistance with HEP/ education concepts   Long Term Goals:18month(s) Stand as long as needed when completing ADL's without increased symptoms, Sit as long as needed with ADL's without increased symptoms, Drive automobile without increased symptoms, Patient demonstrates improved functional core strength, Patient will return to full prior level of function  , Independent with symptom management    TREATMENT PLAN  Patient/family involved in developing goals and treatment plan: Yes    Recommended Treatment Freq 1-2 times per weeks/months for 3 month(s)    Treatment plan inclusive of:   Exercise:AROM, AAROM, PROM, Stretching, Strengthening, Progressive Resistive, Coordination, Aerobic exercise   Manual Techniques:Joint mobilization, Soft tissue release/massage    Modalities:  Cold pack, Functional/Therapeutic activites per flowsheet, Moist heat, Neuromuscular Re-ed,  Activity as indicated, Ther Exercise per flowsheet   Functional: Proprioception/Dynamic stability, Functional rehab, Postural training    Thank you for the referral of this patient to the [facility], PT     Minutes   Time Based CPT  Physical Performance test, Therapeutic Exercise, Therapeutic Activities, NM Re-Education, Manual Therapy, Gait Training, Massage, Aquatic Therapy, Canalith Repositioning, Iontophoresis, Ultrasound, Orthotic fitting/training, Prosthetic fitting 10   Service Based (untimed) CPT  PT/OT evaluation, PT/OT re-eval, E-stim-unattended, Mechanical traction, Vasopneumatic device 30   Unbilled time (rest, etc) 10       Total Treatment Time 50 min       Moist heat 10 min   Chin nod 15 x 5"   Scapular depression 15 x 5"           Manual SOR/cervical traction/UT release 5 min

## 2018-01-10 ENCOUNTER — Encounter: Payer: Self-pay | Admitting: Internal Medicine

## 2018-01-10 MED ORDER — PANTOPRAZOLE SODIUM 40 MG PO TBEC *I*
40.0000 mg | DELAYED_RELEASE_TABLET | Freq: Every day | ORAL | 1 refills | Status: DC
Start: 2018-01-10 — End: 2018-03-04

## 2018-01-14 ENCOUNTER — Encounter: Payer: Self-pay | Admitting: Gastroenterology

## 2018-01-14 ENCOUNTER — Other Ambulatory Visit: Payer: Medicare (Managed Care)

## 2018-01-15 ENCOUNTER — Encounter: Payer: Self-pay | Admitting: Surgery

## 2018-01-15 ENCOUNTER — Ambulatory Visit
Payer: Medicare (Managed Care) | Attending: Physical Medicine and Rehabilitation | Admitting: Rehabilitative and Restorative Service Providers"

## 2018-01-15 DIAGNOSIS — M5412 Radiculopathy, cervical region: Secondary | ICD-10-CM | POA: Insufficient documentation

## 2018-01-15 NOTE — Progress Notes (Signed)
I received a fax from the pharmacy that they were changing the clobetasol to betamethasone.    I last saw the patient in sept.  I called her and she said her tube site is nearly closed and she is not using the topical steroid any more.    Will D/C medication all together.

## 2018-01-15 NOTE — Progress Notes (Signed)
[facility] Orthopedic Sports/Spine Rehabilitation  PT Treatment Note    Todays Date: 01/15/2018    Name: Amanda Galloway  DOB: 27-May-1945  Referring Physician: Lenard Simmer, MD  Diagnosis:   1. Cervical radiculopathy         Visit #: 2    Subjective:  Pain Assessment: 10  Pt reports no significant changes in pain or function since previous treatment session.  Pt states she takes Ibuprofen for the pain but this does not do anything; she has done the exercises without any changes.         Objective:  ROM -  ROM Loss and Movement Quality  Flexion: moderate loss, Painful  Extension:  significant loss, Painful  Left Sidebend: moderate loss, Painful  Right Sidebend:  moderate loss, Painful  Left Rotation:  minor loss, Painful  Right Rotation:  minor loss, Painful    Strength - Therapeutic Exercises per flowsheet  Function: - Unchanged  Education:  Updated HEP, Verbal cues for ther ex, Manual cues for ther ex    Objective        Treatment:  Manual therapy,  Joint Mobilization, Soft tissue Mobilization / Massage, Moist heat, Ther Exercise per flowsheet    Assessment:   We reviewed HEP to ensure proper technique.  Patient demonstrates impaired cervical ROM and soft tissue mobility.  Patient would benefit from skilled PT in order to increase functional mobility.       Plan of Care:  Continue per Plan of care -  As written; Patient would benefit from skilled rehabilitation services to address the above impairments to restore functional capacity.    Thank you for referring this patient to [facility], PT       Minutes   Time Based CPT  Physical Performance test, Therapeutic Exercise, Therapeutic Activities, NM Re-Education, Manual Therapy, Gait Training, Massage, Aquatic Therapy, Canalith Repositioning, Iontophoresis, Ultrasound, Orthotic fitting/training, Prosthetic fitting 30   Service Based (untimed) CPT  PT/OT evaluation, PT/OT  re-eval, E-stim-unattended, Mechanical traction, Vasopneumatic device    Unbilled time (rest, etc) 10 min       Total Treatment Time 40 min       Moist heat 10 min   Chin nod 15 x 5"   Scapular depression 15 x 5"   Scapular setting/shoulder ER [place] 2 x 10   Cervical rotation stretch 10 x 5" B   Pulley flexion 20 x 5"           Manual SOR/cervical traction/UT release  Manual cervical ROM 10 min

## 2018-01-16 ENCOUNTER — Encounter: Payer: Self-pay | Admitting: Surgery

## 2018-01-16 ENCOUNTER — Ambulatory Visit
Admission: RE | Admit: 2018-01-16 | Discharge: 2018-01-16 | Disposition: A | Discharge status: Home / Self Care | Payer: Medicare (Managed Care) | Source: Ambulatory Visit | Attending: Surgery | Admitting: Surgery

## 2018-01-16 DIAGNOSIS — R109 Unspecified abdominal pain: Secondary | ICD-10-CM

## 2018-01-16 DIAGNOSIS — R140 Abdominal distension (gaseous): Secondary | ICD-10-CM | POA: Insufficient documentation

## 2018-01-16 DIAGNOSIS — Z9889 Other specified postprocedural states: Secondary | ICD-10-CM | POA: Insufficient documentation

## 2018-01-16 DIAGNOSIS — C259 Malignant neoplasm of pancreas, unspecified: Secondary | ICD-10-CM

## 2018-01-20 ENCOUNTER — Encounter: Payer: Self-pay | Admitting: Gastroenterology

## 2018-01-20 ENCOUNTER — Encounter: Payer: Self-pay | Admitting: Physical Medicine and Rehabilitation

## 2018-01-20 ENCOUNTER — Ambulatory Visit
Payer: Medicare (Managed Care) | Attending: Physical Medicine and Rehabilitation | Admitting: Physical Medicine and Rehabilitation

## 2018-01-20 ENCOUNTER — Ambulatory Visit
Admission: RE | Admit: 2018-01-20 | Discharge: 2018-01-20 | Disposition: A | Discharge status: Home / Self Care | Payer: Medicare (Managed Care) | Source: Ambulatory Visit | Attending: Gastroenterology | Admitting: Gastroenterology

## 2018-01-20 VITALS — BP 107/57 | HR 68 | Ht 66.25 in | Wt 142.0 lb

## 2018-01-20 DIAGNOSIS — M5412 Radiculopathy, cervical region: Secondary | ICD-10-CM

## 2018-01-20 DIAGNOSIS — R1011 Right upper quadrant pain: Secondary | ICD-10-CM

## 2018-01-20 MED ORDER — METHYLPREDNISOLONE 4 MG PO TBPK *A*
ORAL_TABLET | ORAL | 0 refills | Status: DC
Start: 2018-01-20 — End: 2018-02-25

## 2018-01-20 MED ORDER — PREGABALIN 75 MG PO CAPS *A*
75.0000 mg | ORAL_CAPSULE | Freq: Three times a day (TID) | ORAL | 1 refills | Status: DC
Start: 2018-01-20 — End: 2018-01-27

## 2018-01-20 NOTE — Progress Notes (Signed)
Amanda Galloway is here today for a follow-up visit.  [name] had 2 sessions of physical therapy but unfortunately has not had any benefit.  She has severe pain in her neck and right arm that is constant.  It makes it very difficult for her to sleep.     An x-ray perform last visit showed moderate to severe degenerative disc disease at the mid to lower cervical spine most progressed at C5-6 and C6-7 levels.      On exam upper extremity strength is intact.  Spurling maneuver is negative.    Impression/plan: Right cervical radicular pain.  I will further evaluate with a cervical MRI with and without contrast given patient's prior history of pancreatic cancer.  For pain management I will have her increase Lyrica to 3 a day.  She will also be given a Medrol Dosepak.  Follow-up after imaging.

## 2018-01-20 NOTE — Discharge Instructions (Signed)
Follow up with Dr. Ron Agee

## 2018-01-20 NOTE — Progress Notes (Addendum)
Amanda Galloway  [id number]     01/20/2018  Pruett, Vick Frees, [address] / [place] [address]     We had the pleasure of seeing your patient, Amanda Galloway, in the outpatient gastroenterology/hepatology clinic. As you know, she is a 72 y.o. 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 PMHx includes pancreatic cancer, depression, fibromyalgia, hypothyroidism.    Patient with complicated surgical history including Whipple for pancreatic adenocarcinoma, treated with neoadjuvant chemotherapy and SBRT. Multiple complications following surgery requiring PEG tube for feeding purposes, which resulted in a new fluid collection behind the stomach and suspected perforation from PEG tube placement. PEG tube was ultimately removed 09/23/17 due to concern for obstruction of efferent limb. At time of last visit, patient was reporting continued drainage from gastrostomy site and soreness around the fistula. Patient had EGD 12/11/17 with successful closure of the opening of the gastrocutaneous fistula with OVESCO clip. Patient reports that she has not noticed any continued drainage from the fistula site.     Patient presents today feeling overall well. She has concerns of a "bandlike abdominal pain" in her upper abdomen that radiates around her back, which has worsened over the last 2-4 months. She reports a sensation of "someone sitting on my abdomen". Her daughter reports that this pain is worsened since patient stopped antibiotics for multiple abdominal abscesses after her surgery. She has not taken anything for this pain. This pain is all the time but varies in intensity and tends to worsen with food. She also reports intermittent bloating more than prior to her surgery. Patient denies fevers, chills, nausea, vomiting, pyrosis, dysphagia, odynophagia, change in appetite, or weight loss. Patient is having 3 bowel  movements daily, typically formed, on Creon with meals and stool softeners as needed. She denies hematochezia or melena.      Allergies/Sensitivities:No Known Allergies (drug, envir, food or latex)    Medications:   Prior to Admission medications    Medication Sig Start Date End Date Taking? Authorizing Provider   methylPREDNISolone (MEDROL PAK) 4 MG tablet pack Use as directed on package 01/20/18   Lenard Simmer, MD   pregabalin (LYRICA) 75 MG capsule Take 1 capsule (75 mg total) by mouth 3 times daily Max daily dose: 225 mg 01/20/18 02/19/18  Lenard Simmer, MD   pantoprazole (PROTONIX) 40 MG EC tablet Take 1 tablet (40 mg total) by mouth daily Swallow whole. Do not crush, break, or chew 01/10/18 02/09/18  Pruett, Vick Frees, MD   pancrelipase (CREON 24) 24000-76000 units DR capsule Take 1-3 capsules prior to meals and 1-2 prior to snacks 12/31/17   Tresa Endo, PA   metoclopramide (REGLAN) 5 MG tablet Take 1 tablet (5 mg total) by mouth 4 times daily (before meals and nightly)  Patient not taking: Reported on 01/20/2018 12/27/17 01/26/18  Rodrigo Ran, NP   DULoxetine (CYMBALTA) 20 MG DR capsule Take 2 capsules (40 mg total) by mouth every evening 11/18/17 01/20/18  Pruett, Vick Frees, MD   ferrous sulfate 325 (65 FE) MG tablet Take 1 tablet (325 mg total) by mouth daily (with breakfast) 11/18/17   Pruett, Vick Frees, MD   levothyroxine (SYNTHROID, LEVOTHROID) 150 MCG tablet Take 1 tablet (150 mcg total) by mouth daily (before breakfast) 11/18/17 01/20/18  Pruett, Vick Frees, MD   PREMARIN 0.625 MG tablet Take 1 tablet (0.625 mg total) by mouth every morning 11/18/17  Pruett, Vick Frees, MD   triamterene-hydrochlorothiazide [facility]) 37.5-25 MG per capsule Take 1 capsule by mouth every morning 11/18/17 05/17/18  Pruett, Vick Frees, MD   clobetasol (TEMOVATE) 0.05 % cream Apply topically 2 times daily   To abdomen rash  Patient not taking: Reported on 01/20/2018 11/11/17   Hanchett, [place], NP   Red Yeast Rice  Extract (RED YEAST RICE PO) Take by mouth    [provider]   nystatin (NYSTATIN) powder Apply topically 2 times daily  Patient not taking: Reported on 01/20/2018 10/28/17   Arty Baumgartner, NP   silver nitrate applicators 24-23 % applicator Apply topically three times a week  Patient not taking: Reported on 01/20/2018 10/18/17   Rodrigo Ran, NP   docusate sodium (COLACE) 100 MG capsule Take 2 capsules (200 mg total) by mouth 2 times daily 09/25/17   Haugh, Alyssa R, PA   ondansetron (ZOFRAN-ODT) 4 MG disintegrating tablet Take 1 tablet (4 mg total) by mouth 3 times daily as needed (nausea)   Place on top of tongue. 09/25/17   Haugh, Alyssa R, PA   SUMAtriptan (IMITREX) 25 MG tablet Take 1 tablet (25 mg total) by mouth as needed for Migraine   Take at onset of headache. May repeat once in 2 hours. 09/25/17   Arty Baumgartner, NP   rollator Use as directed.  Patient not taking: Reported on 01/20/2018 08/23/17   Severiano Gilbert, NP   Nutritional Supplements (PROSOURCE NO CARB) LIQD liquid Take 30 mLs by mouth 2 times daily (with meals)  Patient not taking: Reported on 01/20/2018 08/20/17   Severiano Gilbert, NP   senna (SENOKOT) 8.6 MG tablet Take 2 tablets by mouth 2 times daily 11/02/16   Hellems, Guinevere A, PA   Vitamins - senior (CENTRUM SILVER) TABS Take 1 tablet by mouth daily  Patient taking differently: Take 1 tablet by mouth every morning    10/14/16   Timmothy Sours, NP       ROS:  Gen: no fevers/chills, no weight loss  Cardiac: no CP   Pulm: no SOB  Abd: as per HPI    Vitals:   Vitals:    01/20/18 1442   BP: 113/55   Pulse: 72   Weight: 64.8 kg (142 lb 14.4 oz)   Height: 168.3 cm (5' 6.25")     Body mass index is 22.89 kg/m.    Physical Exam:   General: Well appearing 72 year old female in NAD.   HEENT: sclera anicteric. Oral mucosa pink and moist.   Lungs: Normal respiratory effort, clear bilaterally to auscultation.    Cor: RRR without murmur.    Abdomen: NABS, no distention.  Generalized tenderness. No hepatosplenomegaly, hernias, masses, or ascites. Fistula site well healed.   Extr: Warm, well-perfused. No edema.    Neuro: Grossly no focal motor deficits.   Skin: No jaundice, rash, or ulcers on visualized skin.     GI Procedures:     EGD 12/11/2017  After adequate sedation, the endoscope was carefully introduced into the oropharynx and passed in to the esophagus under direct visualization.  The scope was advanced into the stomach.  The stomach showed evidence of classical whipple resection .  The anastomosis to the efferent and afferent limb was wide open.  A guidewire was advanced through the external opening of the gastrocutaneous fistula.  The inner opening of the fistula was identified in the a efferent jejunal limb just beyond the gastrojejunal anastomosis.  The  mucosa around the inner opening was ablated with APC at 34 W.  The scope was withdrawn and OVESCO clip was applied to the scope.  The scope was reintroduced into the esophagus and advanced up to the site of inner opening of gastrojejunal fistula.  We were not able to advance the anchor through the scope.  The diagnostic EGD scope was changed to single channel therapeutic upper endoscope.  The OVESCO clip was applied to the therapeutic EGD scope.  The scope was introduced into the esophagus, then advanced up to the site of inner opening of gastrojejunal fistula.  The tissue at rhe site of fistula was sucked into the clip by the use of anchor and OVESCO clip was applied successfully.  There was no leakage of air from the external opening of the gastrojejunal fistula after application of OVESCO clip.  Patient tolerated the procedure well.  Dr. Ron Agee kindly came to view the inner opening of the fistula.  Intervention(s):   As above  Complication(s):   none  EBL: 3 ml  Impression(s):  1. Successful closure of the opening of gastro cutaneous fistula with OVESCo clip  Recommendation(s):   Patient to be on clears for 2  days.   Follow-up in the office.  Histopathologic Diagnosis:             none    Labs/Imaging:      12/27/2017 15:12   Sodium 140   Potassium 3.9   Chloride 101   CO2 27   Anion Gap 12   UN 22 (H)   Creatinine 1.02 (H)   GFR,Black 64   GFR,Caucasian 55 (!)   Glucose 92   Calcium 9.0   Total Protein 7.2   Albumin 4.0   ALT 45 (H)   AST 57 (H)   Alk Phos 438 (H)   Bilirubin,Total <0.2      12/27/2017 15:12   WBC 5.3   RBC 4.1   Hemoglobin 11.6   Hematocrit 37   MCV 91   MCH 28   MCHC 31 (L)   RDW 15.5 (H)   Platelets 267   Neut # K/uL 3.1   Lymph # K/uL 1.5   Mono # K/uL 0.5   Eos # K/uL 0.1   Baso # K/uL 0.1   IMM Granulocytes # 0.0   Nucl RBC # K/uL 0.0   Seg Neut % 58.5   Lymphocyte % 28.1   Monocyte % 10.0   Eosinophil % 2.1   Basophil % 1.1   IMM Granulocytes 0.2   Nucl RBC % 0.0     Impression(s)/Recommendation(s):   Amanda Galloway is a 72 y.o. 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 PMHx includes pancreatic cancer, depression, fibromyalgia, hypothyroidism. Patient has a complicated surgical history with Whipple for pancreatic adenocarcinoma complicated with requiring PEG tube for feeding purposes, which resulted in a fluid collection behind the stomach and suspected perforation from PEG tube placement. PEG was removed 7/22, but patient continued to have drainage and soreness around fistula site. EGD 12/11/17 with successful closure of the opening of gastrocutaneous fistula. She has since not noticed any pain or drainage from the fistula site. Patient's main concern in the office today is a "bandlike abdominal pain" in the upper abdomen that radiates around her back. This pain is present all the time but worsens with food. She was started on Creon approximately 4-6 weeks ago without any significant change  in her symptoms. Recommend she follow up with Dr. Ron Agee for further evaluation.     Plan:   - Follow up  with Dr. Ron Agee      Follow up as needed if new concerns or worsening symptoms.     We thank you for allowing [name] to participate in this patient's care.  The patient was seen, evaluated and plan determined by Dr. Juline Patch. Please do not hesitate to contact us with any questions or concerns at [phone].    Amanda Galloway, [place]       I saw and evaluated the patient. I agree with the resident's/fellow's findings and plan of care as documented above.    72 year old female being followed for fistula at the PEG site.  Patient has history of pancreatic cancer.  She is status post Whipple resection.  After the surgery she developed numerous complications from which she has recovered well.  The fistula at the PEG site was closed endoscopically with Ovesco clip.  The fistula has healed and she is not draining any fluid.  She continues to complain of upper abdominal pain.    Patient was advised to follow with Dr. Ron Agee.  We will see her as needed.    Ollis Daudelin Foye Spurling, MD

## 2018-01-22 ENCOUNTER — Other Ambulatory Visit: Payer: Medicare (Managed Care)

## 2018-01-22 ENCOUNTER — Ambulatory Visit: Payer: Medicare (Managed Care) | Admitting: Rehabilitative and Restorative Service Providers"

## 2018-01-27 ENCOUNTER — Other Ambulatory Visit: Payer: Self-pay

## 2018-01-27 ENCOUNTER — Ambulatory Visit
Admission: RE | Admit: 2018-01-27 | Discharge: 2018-01-27 | Disposition: A | Discharge status: Home / Self Care | Payer: Medicare (Managed Care) | Source: Ambulatory Visit | Attending: Physical Medicine and Rehabilitation | Admitting: Physical Medicine and Rehabilitation

## 2018-01-27 DIAGNOSIS — M4722 Other spondylosis with radiculopathy, cervical region: Secondary | ICD-10-CM

## 2018-01-27 DIAGNOSIS — M5412 Radiculopathy, cervical region: Secondary | ICD-10-CM

## 2018-01-27 DIAGNOSIS — M4723 Other spondylosis with radiculopathy, cervicothoracic region: Secondary | ICD-10-CM

## 2018-01-27 DIAGNOSIS — M2578 Osteophyte, vertebrae: Secondary | ICD-10-CM

## 2018-01-27 MED ORDER — GADOBUTROL 1 MMOL/ML (GADAVIST) IV SOLN *WRAPPED*
7.0000 mL | Freq: Once | INTRAVENOUS | Status: AC
Start: 2018-01-27 — End: 2018-01-27
  Administered 2018-01-27: 7 mL via INTRAVENOUS

## 2018-01-27 NOTE — Telephone Encounter (Signed)
Patient is leaving town Wednesday morning.

## 2018-01-28 ENCOUNTER — Ambulatory Visit
Admission: RE | Admit: 2018-01-28 | Discharge: 2018-01-28 | Disposition: A | Discharge status: Home / Self Care | Payer: Medicare (Managed Care) | Source: Ambulatory Visit

## 2018-01-28 DIAGNOSIS — Z9889 Other specified postprocedural states: Secondary | ICD-10-CM

## 2018-01-28 DIAGNOSIS — R109 Unspecified abdominal pain: Secondary | ICD-10-CM

## 2018-01-28 DIAGNOSIS — C259 Malignant neoplasm of pancreas, unspecified: Secondary | ICD-10-CM

## 2018-01-28 MED ORDER — IOHEXOL 350 MG/ML (OMNIPAQUE) IV SOLN *I*
1.0000 mL | Freq: Once | INTRAVENOUS | Status: AC
Start: 2018-01-28 — End: 2018-01-28
  Administered 2018-01-28: 100 mL via INTRAVENOUS

## 2018-01-28 MED ORDER — STERILE WATER FOR IRRIGATION IR SOLN *I*
971.0000 mL | Freq: Once | Status: AC
Start: 2018-01-28 — End: 2018-01-28
  Administered 2018-01-28: 971 mL via ORAL

## 2018-02-03 ENCOUNTER — Ambulatory Visit
Payer: Medicare (Managed Care) | Attending: Physical Medicine and Rehabilitation | Admitting: Physical Medicine and Rehabilitation

## 2018-02-03 ENCOUNTER — Encounter: Payer: Self-pay | Admitting: Physical Medicine and Rehabilitation

## 2018-02-03 VITALS — BP 107/54 | HR 75 | Ht 66.5 in | Wt 140.2 lb

## 2018-02-03 DIAGNOSIS — M25511 Pain in right shoulder: Secondary | ICD-10-CM

## 2018-02-03 MED ORDER — PREGABALIN 150 MG PO CAPS *I*
150.0000 mg | ORAL_CAPSULE | Freq: Two times a day (BID) | ORAL | 2 refills | Status: DC
Start: 2018-02-03 — End: 2018-05-26

## 2018-02-03 MED ORDER — CYCLOBENZAPRINE HCL 10 MG PO TABS *I*
10.0000 mg | ORAL_TABLET | Freq: Every evening | ORAL | 2 refills | Status: DC | PRN
Start: 2018-02-03 — End: 2018-09-11

## 2018-02-03 NOTE — Progress Notes (Signed)
Ms. Pelle is here today for a scheduled follow-up visit to review cervical MRI.    MRI demonstrated disc osteophyte complex at C5-6 level resulting in mild spinal canal narrowing and indentation of the ventral cord.  No abnormal cord signal.  No significant foraminal narrowing.    Patient has persistent pain in the right arm.  She completed a Medrol Dosepak without benefit.  She is currently on Lyrica 75 mg 3 times a day.    On exam patient has functional cervical range of motion.  She has functional shoulder range of motion bilaterally.  Negative Spurling.  No focal motor weakness.  Tenderness to palpation diffusely in the cervical region and bilateral upper traps.    Impression/plan: Patient with chronic neck and arm pain primarily on the right side.  There is no evidence of cervical radiculopathy on imaging nor exam.  Patient does report a history of fibromyalgia which could be playing a role.  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    Recommend increasing Lyrica up to 300 mg a day.  Patient will also be given a prescription for Flexeril at her request.  I've ordered an EMG/nerve conduction study of both upper extremities for further evaluation.  Patient will also be referred to pain medicine for pain management.

## 2018-02-04 MED ORDER — PREGABALIN 75 MG PO CAPS *A*
75.0000 mg | ORAL_CAPSULE | Freq: Three times a day (TID) | ORAL | 1 refills | Status: DC
Start: 2018-02-04 — End: 2018-02-25

## 2018-02-05 ENCOUNTER — Ambulatory Visit: Payer: Medicare (Managed Care) | Admitting: Rehabilitative and Restorative Service Providers"

## 2018-02-10 ENCOUNTER — Telehealth: Payer: Self-pay | Admitting: Internal Medicine

## 2018-02-10 ENCOUNTER — Ambulatory Visit: Payer: Medicare (Managed Care) | Admitting: Surgical Oncology

## 2018-02-10 NOTE — Telephone Encounter (Signed)
[facility]  called requesting a call back regarding fax.

## 2018-02-11 NOTE — Telephone Encounter (Signed)
Writer returned call to [employer].  Fax was sent on 02/10/18 to [phone] for drug therapy change form regarding change from Premarin to estradiol.

## 2018-02-13 NOTE — Telephone Encounter (Signed)
I received this form; and I chose not to change rx at this time. -DP

## 2018-02-25 ENCOUNTER — Ambulatory Visit: Payer: Medicare (Managed Care) | Attending: Anesthesiology | Admitting: Pain Medicine

## 2018-02-25 VITALS — BP 106/55 | HR 72 | Temp 96.6°F | Ht 66.0 in | Wt 149.0 lb

## 2018-02-25 DIAGNOSIS — M542 Cervicalgia: Secondary | ICD-10-CM

## 2018-02-25 DIAGNOSIS — G8929 Other chronic pain: Secondary | ICD-10-CM

## 2018-02-25 MED ORDER — OXYCODONE-ACETAMINOPHEN 5-325 MG PO TABS *I*
1.0000 | ORAL_TABLET | ORAL | 0 refills | Status: DC | PRN
Start: 2018-02-25 — End: 2018-03-10

## 2018-02-25 NOTE — Progress Notes (Signed)
Anesthesiology Pain Consult Note - [facility]    This is a confidential report written only for the purpose of professional communication. Clients who wish to receive these findings are requested to contact the [facility] at [place].     Amanda Galloway is a 72 y.o. year old female.  DOB: 10/11/1945  Primary Care Physician: Amanda Gasser, MD  Chief Complaint: Right arm pain  Consult requested by: Ortho    History of Present Illness:  Amanda Galloway presents for initial evaluation. She has been experiencing pain in the right shoulder and upper arm since this summer when she had a prolonged hospitalization for a Whipple procedure for pancreatic cancer done on 03/29/17.     She has a history of a herniated disc in her neck with fusion surgery about 30 years ago. She also has a history of a right rotator cuff repair a few years ago. 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     The pain is in the right neck, shoulder, and proximal upper arm about mid way down her humerus. Her shoulder and arm are worse than her neck. She denies UE paresthesias, loss of sensation, weakness, saddle anesthesia, or change in bowel or bladder.     She rates her pain at a 7/10 today, and describes it as throbbing and radiating in nature. It is worse with changes in the weather, use of her right arm, and stress. It is improved by nothing right now. She is using pregabalin 150 mg twice daily and was already on duloxetine 40 mg once daily. NSAIDS, acetaminophen, and cyclobenzaprine have not helped. [name] had PT which did not help.     She has an EMG scheduled for 03/25/18.      Treatment History:  Physical therapy - yes  Aquatic therapy - no  Massage therapy - no  TENS - no  Acupuncture - no  Chiropractic - no  Interventional procedures - no    Past Medical History:   Diagnosis Date    Acute kidney failure 03/31/2017    Cancer     Depression      Fibromyalgia     Hypothyroidism     Pancreatic cancer      Past Surgical History:   Procedure Laterality Date    APPENDECTOMY      CERVICAL FUSION      CHOLECYSTECTOMY      CHOLECYSTECTOMY, LAPAROSCOPIC  09/06/2016    HYSTERECTOMY  08/1986    Fibroids    KNEE SURGERY Right     PR INSERT TUNNELED CV CATH WITH PORT Right 10/04/2016    Procedure: Right IJ MEDIPORT Insertion;  Surgeon: Delano Metz, MD;  Location: [facility] MAIN OR;  Service: Oncology General    PR LAP,DIAGNOSTIC ABDOMEN N/A 10/04/2016    Procedure: LAPAROSCOPY DIAGNOSTIC;  Surgeon: Delano Metz, MD;  Location: [facility] MAIN OR;  Service: Oncology General    PR PART [place] PANC,PROX+REMV DUOD+ANAST N/A 03/29/2017    Procedure: WHIPPLE PROCEDURE;  Surgeon: Delano Metz, MD;  Location: [facility] MAIN OR;  Service: Oncology General    ROTATOR CUFF REPAIR Right     TONSILLECTOMY AND ADENOIDECTOMY      whipple       Family History   Problem Relation Age of Onset    Breast cancer Mother         died 54    Cancer Father  NHL, died 44    Heart Disease Father     Diabetes Sister     Obesity Sister         4 siblings    No Known Problems Daughter      Social History     Socioeconomic History    Marital status: Divorced     Spouse name: Not on file    Number of children: Not on file    Years of education: Not on file    Highest education level: Not on file   Occupational History    Not on file   Tobacco Use    Smoking status: Never Smoker    Smokeless tobacco: Never Used   Substance and Sexual Activity    Alcohol use: No    Drug use: No    Sexual activity: Never     Partners: Male   Social History Narrative    Lives with daughter in [place]    Previously Glass blower/designer for [employer], now retired    Ex-husband in [place]     Employment/Social History: Retired from the state. Divorced with two adult children. Her daughter is a Marine scientist who lives here locally, and her son is in [place].     Allergies: No Known Allergies (drug, envir, food or  latex)    Current Medications:    Current Outpatient Medications   Medication    pregabalin (LYRICA) 150 MG capsule    pancrelipase (CREON 24) 24000-76000 units DR capsule    ferrous sulfate 325 (65 FE) MG tablet    PREMARIN 0.625 MG tablet    triamterene-hydrochlorothiazide (DYAZIDE) 37.5-25 MG per capsule    Red Yeast Rice Extract (RED YEAST RICE PO)    docusate sodium (COLACE) 100 MG capsule    SUMAtriptan (IMITREX) 25 MG tablet    rollator    senna (SENOKOT) 8.6 MG tablet    Vitamins - senior (CENTRUM SILVER) TABS    cyclobenzaprine (FLEXERIL) 10 MG tablet    pantoprazole (PROTONIX) 40 MG EC tablet    DULoxetine (CYMBALTA) 20 MG DR capsule    levothyroxine (SYNTHROID, LEVOTHROID) 150 MCG tablet    ondansetron (ZOFRAN-ODT) 4 MG disintegrating tablet     No current facility-administered medications for this visit.      Review of Systems: A comprehensive review of 14 systems was performed and reviewed with the patient. Please refer to the scanned intake form for details.    Diagnostics/Imaging:  01/27/18: Cervical MRI with and without contrast  There is reversal of the cervical lordosis as can be seen with positioning or muscle spasm. The cervical vertebral bodies are normal in height. There is partial fusion of the C6-C7 vertebral bodies. The facet joints are normal in alignment. Minimal degenerative marrow edema/enhancement is present along the C5-C6 endplates.    The craniocervical junction is normal in appearance. The cervical spinal cord is normal in signal morphology. No abnormal enhancement within the cervical spinal canal.    Individual levels:    C2-C3: No disc herniation, spinal canal or foraminal narrowing.    C3-C4: Broad-based posterior disc osteophyte complex ligamentum flavum thickening result in mild spinal canal narrowing. These findings in association with uncovertebral hypertrophy and facet arthropathy result in mild bilateral foraminal narrowing.    C4-C5: Endplate  spondylotic ridging results in mild flattening of the ventral margin of the thecal sac. Left uncovertebral hypertrophy and facet arthropathy result in minimal left foraminal narrowing. No significant right foraminal narrowing.    C5-C6: Broad-based  posterior disc osteophyte complex results in mild spinal canal narrowing and mild indentation of the ventral cord. No significant foraminal narrowing.    C6-C7: Partial fusion across the disc space. Endplate spondylotic ridging results in mild flattening the ventral margin of the thecal sac. No significant foraminal narrowing.    C7-T1: Endplate spondylotic ridging results in mild flattening of the ventral margin of the thecal sac. No significant foraminal narrowing.    OTHER: Incidentally noted retropharyngeal course of the bilateral common carotid arteries.    12/11/17: Cervical xray  Moderate to severe degenerative disc disease of the mid to lower cervical spine most progressed at C5-C6 and C6-C7. No acute fracture or subluxation. Osteopenia.     07/19/17: Right shoulder xray  There is no evidence of a displaced fracture or dislocation. Moderate degenerative hypertrophy of the [facility] joint. Mild degenerative changes of the glenohumeral joint. Diffuse osteopenia. Regional soft tissues are grossly unremarkable.    PHYSICAL EXAM:  BP 106/55    Pulse 72    Temp 35.9 C (96.6 F) (Temporal)    Ht 1.676 m (5\' 6" )    Wt 67.6 kg (149 lb)    SpO2 98%    BMI 24.05 kg/m   General: Pleasant and cooperative with an appropriate affect. No acute distress. Well nourished and well developed overweight obese.   HEENT: Normocephalic without signs of trauma or gross abnormalities.   Heart/Lungs: Normal chest circumference with full respiratory excursions.   Musculoskeletal: Gait is non antalgic without the use of ambulatory aids. No scars, spasm, deformity, or tenderness of the right shoulder. She has some mild tenderness of the right trapezius. No scoliosis or kyphosis. AROM of the  cervical spine is limited without pain reproduction. AROM of the right shoulder is mildly reduced with pain reproduction with full abduction. Muscle strength testing of all the major muscle groups of the upper extremities is 5+/5+ equal and intact bilaterally. Negative Spurling's.  Neurologic: Cranial nerves II-XII are grossly intact. Alert and oriented X 3. Tricep, bicep, and brachioradialis DTRs are 2+/4+ equal and intact bilaterally. Sensation to light touch in all the major dermatomes of the upper extremities is equal and intact bilaterally.     Assessment:  Ms. Sensabaugh was seen in conjunction with Dr. Jaquita Rector. Her pain is in the right shoulder and upper arm, and seems most consistent with cervical radicular pain even though her cervical MRI does not show clear foraminal narrowing. The patient herself feels the pain feels the same as when she herniated a cervical disc thirty years ago. At that time she had the exact same pattern of pain in the left shoulder and arm. Her EMG is not scheduled until 03/25/18.    She is already on pregabalin and duloxetine, and she was given a Rx for oxycodone/APAP. We would like to proceed with a cervical ESI also. If this for some reason does not help her pain, the next step would be an MRI of her right shoulder.     Previous conservative treatment with at least six weeks of modified or reduced activity, physical therapy with continuation of a home exercise program (stretching, range of motion exercises, and strengthening), and/or chiropractic care has failed to provide the patient with adequate pain relief and functional improvement. Multiple classes of medications have been tried, including three months of non steroidal anti-inflammatories unless contraindicated. The patient has failed three months or more of conservative treatment, and we are requesting authorization to proceed with a cervical epidural steroid injection with a  goal to improve the patient's function, range of motion,  mobility, pain, and reliance on medications. Fluoroscopic or ultrasound guidance will be used for the procedure.     Plan:  1) Oxycodone/APAP 5/325mg  #60.   2) Cervical ESI.     Authored by: Vivien Rossetti, PA-C        I saw and evaluated the patient. I edited the above note and agree with the resident's/fellow's findings and plan of care as documented above.    [place], [place]

## 2018-03-04 ENCOUNTER — Other Ambulatory Visit: Payer: Self-pay | Admitting: Internal Medicine

## 2018-03-10 ENCOUNTER — Telehealth: Payer: Self-pay | Admitting: Pain Medicine

## 2018-03-10 MED ORDER — OXYCODONE-ACETAMINOPHEN 7.5-325 MG PO TABS *A*
1.0000 | ORAL_TABLET | Freq: Four times a day (QID) | ORAL | 0 refills | Status: DC | PRN
Start: 2018-03-10 — End: 2018-04-22

## 2018-03-10 NOTE — Telephone Encounter (Signed)
I spoke to Amanda Galloway. She has been using two 5/325 mg tablets of oxycodone three times a day with good pain relief. She tried just one tablet, and it did not help her pain. She only has a few tablets left.     I told her I would like to try a slightly lower dose of 7.5/325 mg tablets four times a day instead of 10 mg. A new Rx for #120 was sent into her pharmacy today.     She has not had her EMG yet, and is not scheduled for a ESI. I will watch for her results and call her one they are available.

## 2018-03-10 NOTE — Telephone Encounter (Signed)
Pt seen 02/25/18 by Joyce/Dr Jaquita Rector. Pt states she is out? She is asking for joyce to call her back

## 2018-03-13 NOTE — Progress Notes (Addendum)
[facility]-GI Surgery Follow up note    Chief Complaint  Amanda Galloway is a 73 y.o. female who presents  on 03/18/2018 for [facility]-GI follow-up care.     Diagnosis  Locally advanced Pancreatic adenocarinoma  Whipple on 03/29/2017    Physician Team  Surgeon:  Delano Metz, MD  PCP:  Celesta Aver, MD  Gastroenterologist: Fonnie Jarvis, MD  Medical Oncology: Araam Hezel Calton Dach), MD   Radiation Oncologist: Durene Romans, MD    Interval History  Doing well. No complaints of abdominal pain or leaking from previous tube site. Marland Kitchen   Her biggest impediment is her neck and shoulder pain.   Currently taking some percocet with some relief. Following up with ortho.   In the last several days she noticed some urinary incontinece Denies any symptoms of dysuria, pyuria, frequency  or flank pain.    Eating/drinking well. Weight stable. Bowel movements are normal.  Voiding without difficulty. No recent illness or hospitalization.  No jaundice, biliuria, acholic stools, steatorrhea, unintentional weight loss, fevers or chills.     Oncologic History   Over several months, patient noted worsening abdominal pain and weight loss. Extensive work-up including EGD, colonoscopy and imaging were unrevealing. After suggestive HIDA scan, she underwent cholecystectomy on September 06, 2016 but symptoms persisted.      MRCP on September 25, 2016 demonstrated2x2cm soft tissue mass-like abnormality in celiac region, contiguous with medial margin of uncinate process of pancreas concerning for pancreatic lession vs lymphadenopathy.     EUS/FNA on September 27, 2016 demonstrated a mass in pancreas uncinate process and lymph node pressing celiac axis. FNA confirmed adenocarcinoma.     She was transferred to [facility] for additional management. Abd/pelvis CT on September 30, 2016 revealed ill-defined prominent retroperitoneal soft tissue with central low attenuation suggestive of necrosis which abuts SMA and is adjacent to pancreatic uncinate process similar to prior  MRI. Differential includes pancreatic neoplasm versus lymphadenopathy. CT angio on September 29, 2016 showed no pulmonary embolism. Nonspecific 3 mm nodules in right upper and middle lobe, recommend attention on follow-up imaging.      Diagnostic laparoscopy and port placement by Dr. Ron Agee on October 04, 2016 - no evidence of metastatic disease.     FOLFIRINOX chemotherapy - treatment start date 10/05/2016. Completed 8 cycles.     Neoadjuvant SBRT 25 Gy in 5 fractions pancreatic cancer, with concurrent boost to 30 Gy in 5 fractions to tumor around SMA from 02/13/17-03/08/17     She underwent a Whipple procedure with Dr. Delano Metz, MD on 03/29/17.  She was briefly admitted to SICU post operatively for hemodynamic monitoring and the expectation of complicated pain management given patient on home methadone. 1/31 NGT placed for nausea/vomiting. TPN started 2/6. Multiple perc drains placed for fluid collections throughout her stay. On 04/16/17 patient had a PTC drain placed by IR and was transferred back to SICU for increased WOB and was intubated for worsening respiratory status. Chest tube placed 4/5 for left pleural effusion. 4/12 PEG tube placed for poor PO intake. 4/19 gastric perforation from PEG site. 6/7 downsizing of PEG d/t peristomal leakage. 6/13 positive for H. Pylori. Patient's hospital course was also been complicated by constipation, malnutrition, pain control, depression and fluid overload. She was readmitted several times to SICU during her stay.  Palliative care followed patient throughout her hospital stay to assist with pain management, anxiety, depression. All of the patient's drains were removed prior to going home. Her PICC line was removed as well. She was  discharged from the hospital on 08/24/17 to home.      She was readmitted from clinic on 09/18/17-09/25/17 for  failure to thrive due to chronic nausea/vomiting, poor PO intake with suspected gastric outlet obstruction vs gastroparesis. She was  placed on TPN for several days while inpatient as she needed to be NPO for various imaging studies. She had several imaging series with GI, which indicated her PEG tube was the cause of vomiting and discomfort. Her IR placed PEG tube was removed 7/22. She tolerated the procedure well with no immediate complications. Pain and nausea immediately resolved.     At subsequent follow up visits noted to have continuous leakage from prior PEG site.  Local treatment with silver nitrate with temporary resolution.  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  Referred to GI for endoscopic treatment.       Medical History  Past Medical History:   Diagnosis Date    Acute kidney failure 03/31/2017    Cancer     Depression     Fibromyalgia     Hypothyroidism     Pancreatic cancer      Surgical History  Past Surgical History:   Procedure Laterality Date    APPENDECTOMY      CERVICAL FUSION      CHOLECYSTECTOMY      CHOLECYSTECTOMY, LAPAROSCOPIC  09/06/2016    HYSTERECTOMY  08/1986    Fibroids    KNEE SURGERY Right     PR INSERT TUNNELED CV CATH WITH PORT Right 10/04/2016    Procedure: Right IJ MEDIPORT Insertion;  Surgeon: Delano Metz, MD;  Location: [facility] MAIN OR;  Service: Oncology General    PR LAP,DIAGNOSTIC ABDOMEN N/A 10/04/2016    Procedure: LAPAROSCOPY DIAGNOSTIC;  Surgeon: Delano Metz, MD;  Location: [facility] MAIN OR;  Service: Oncology General    PR PART [place] PANC,PROX+REMV DUOD+ANAST N/A 03/29/2017    Procedure: WHIPPLE PROCEDURE;  Surgeon: Delano Metz, MD;  Location: [facility] MAIN OR;  Service: Oncology General    ROTATOR CUFF REPAIR Right     TONSILLECTOMY AND ADENOIDECTOMY      whipple       Family History  Family History   Problem Relation Age of Onset    Breast cancer Mother         died 53    Cancer Father         NHL, died 44    Heart Disease Father     Diabetes Sister     Obesity Sister         4 siblings    No Known Problems Daughter      Social History  Social History      Socioeconomic History    Marital status: Divorced     Spouse name: Not on file    Number of children: Not on file    Years of education: Not on file    Highest education level: Not on file   Occupational History    Not on file   Tobacco Use    Smoking status: Never Smoker    Smokeless tobacco: Never Used   Substance and Sexual Activity    Alcohol use: No    Drug use: No    Sexual activity: Never     Partners: Male   Social History Narrative    Lives with daughter in [place]    Previously Glass blower/designer for [employer], now retired    Ex-husband in [place]  Medications  Outpatient Encounter Medications as of 03/17/2018   Medication Sig Dispense Refill    oxyCODONE-acetaminophen (PERCOCET) 7.5-325 MG per tablet Take 1 tablet by mouth 4 times daily as needed for Pain Max daily dose: 4 tablets 120 tablet 0    pantoprazole (PROTONIX) 40 MG EC tablet TAKE 1 TABLET BY MOUTH EVERY DAY. SWALLOW WHOLE, DO NOT BREAK, CRUSH OR CHEW. 30 tablet 1    pregabalin (LYRICA) 150 MG capsule Take 1 capsule (150 mg total) by mouth 2 times daily Max daily dose: 300 mg 60 capsule 2    cyclobenzaprine (FLEXERIL) 10 MG tablet Take 1 tablet (10 mg total) by mouth nightly as needed for Muscle spasms 30 tablet 2    pancrelipase (CREON 24) 24000-76000 units DR capsule Take 1-3 capsules prior to meals and 1-2 prior to snacks 180 capsule 6    DULoxetine (CYMBALTA) 20 MG DR capsule Take 2 capsules (40 mg total) by mouth every evening 180 capsule 6    ferrous sulfate 325 (65 FE) MG tablet Take 1 tablet (325 mg total) by mouth daily (with breakfast) 100 tablet 3    levothyroxine (SYNTHROID, LEVOTHROID) 150 MCG tablet Take 1 tablet (150 mcg total) by mouth daily (before breakfast) 90 tablet 6    PREMARIN 0.625 MG tablet Take 1 tablet (0.625 mg total) by mouth every morning 90 tablet 3    triamterene-hydrochlorothiazide (DYAZIDE) 37.5-25 MG per capsule Take 1 capsule by mouth every morning 30 capsule 5    Red Yeast  Rice Extract (RED YEAST RICE PO) Take by mouth      docusate sodium (COLACE) 100 MG capsule Take 2 capsules (200 mg total) by mouth 2 times daily 120 capsule 5    ondansetron (ZOFRAN-ODT) 4 MG disintegrating tablet Take 1 tablet (4 mg total) by mouth 3 times daily as needed (nausea)   Place on top of tongue. (Patient not taking: Reported on 01/20/2018) 30 tablet 0    SUMAtriptan (IMITREX) 25 MG tablet Take 1 tablet (25 mg total) by mouth as needed for Migraine   Take at onset of headache. May repeat once in 2 hours. 9 tablet 1    rollator Use as directed. 1 each 0    senna (SENOKOT) 8.6 MG tablet Take 2 tablets by mouth 2 times daily 120 tablet 5    Vitamins - senior (CENTRUM SILVER) TABS Take 1 tablet by mouth daily (Patient taking differently: Take 1 tablet by mouth every morning   ) 30 tablet 0     No facility-administered encounter medications on file as of 03/17/2018.       Allergies  No Known Allergies (drug, envir, food or latex)     Review of Systems  Review of Systems   Constitutional: Negative for chills, fever, malaise/fatigue and weight loss.   HENT: Negative for congestion and sore throat.    Eyes: Negative for blurred vision and redness.   Respiratory: Negative for cough, hemoptysis, sputum production and shortness of breath.    Cardiovascular: Negative for chest pain, palpitations and leg swelling.   Gastrointestinal: Negative for abdominal pain, blood in stool, constipation, diarrhea, heartburn, melena, nausea and vomiting.   Genitourinary: Negative for dysuria, flank pain, frequency and urgency.        + incontinence    Musculoskeletal: Positive for back pain and neck pain. Negative for joint pain and myalgias.   Skin: Negative for itching and rash.   Neurological: Negative for dizziness, focal weakness and seizures.   Psychiatric/Behavioral: Negative  for depression.     Physical Exam  BP 107/53    Pulse 87    Temp 36.9 C (98.4 F)    Wt 69.3 kg (152 lb 10.7 oz)    SpO2 94%    BMI 24.64 kg/m       Physical Exam   Constitutional: She is oriented to person, place, and time. She appears well-developed and well-nourished. No distress.   HENT:   Head: Normocephalic.   Mouth/Throat: No oropharyngeal exudate.   Eyes: Pupils are equal, round, and reactive to light. Conjunctivae and EOM are normal. Right eye exhibits no discharge. Left eye exhibits no discharge. No scleral icterus.   Neck: Normal range of motion.   Cardiovascular: Normal rate, regular rhythm, normal heart sounds and intact distal pulses. Exam reveals no gallop and no friction rub.   No murmur heard.  Pulmonary/Chest: Effort normal and breath sounds normal. No respiratory distress. She has no wheezes. She has no rales.   Abdominal: Soft. Bowel sounds are normal. She exhibits no distension and no mass. There is no abdominal tenderness. There is no rebound and no guarding.       Musculoskeletal:         General: No edema.   Lymphadenopathy:     She has no cervical adenopathy.   Neurological: She is alert and oriented to person, place, and time.   Skin: Skin is warm and dry. No rash noted. She is not diaphoretic. No erythema. No pallor.   Psychiatric: She has a normal mood and affect. Her behavior is normal. Judgment and thought content normal.     Laboratory Data    Pathology  [phone]     FINAL DIAGNOSIS:   (A) Soft tissue, perineural tissue surrounding SMA, biopsy:   - Benign fibroadipose tissue.   - One lymph node, negative for carcinoma (0/1).     (B) Pancreas, Duodenum, Stomach, Pancreaticoduodenectomy (Whipple   resection):   - A small focus (0.2 cm) of high-grade pancreatic intraepithelial   neoplasia.     - No invasive carcinoma identified.     - Background pancreas with chronic pancreatitis and islet cell   hyperplasia.     - Margins negative for dysplasia or carcinoma.     - Stomach with mild inactive chronic gastritis; negative for H.   pylori on H&E.   - Duodenum with no significant pathologic abnormality.     -  Twenty-one lymph nodes, negative for carcinoma (0/21).     - See comment.     (C) Pancreas, uncinate margin, resection:   - Cauterized pancreatic parenchyma and fibroadipose tissue; negative   for carcinoma.   - One lymph node, negative for carcinoma (0/1).     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     Imaging Data  CT abdomen and pelvis without and with contrast   01/29/2018   No acute findings. No evidence for disease recurrence. No new suspicious lymphadenopathy. No bowel obstruction.     Assessment  Amanda Galloway is a 73 y.o. female with a history of malignant neoplasm of the pancreas who underwent a Whipple procedure with Dr. Delano Metz, MD on 03/29/17.  She had a complicated hospital stay which required multiple drains, multiple readmissions to the ICU, TPN, a PEG tube, and multiple scans. She was discharged on 08/24/17.    She has recovered remarkably well since her surgery and  prolonged hospitalization. CT imaging showed no evidence of recurrent disease or any acute findings for her abdominal pain. Currently she no longer has any more abdominal pain. She had fistula repaired by Dr. Foye Spurling with now no more leakage from previous PEG site.     She continues to have some significant neck and shoulder pain. Next month she is scheduled for injections in this area. We dicussed trying other alternative methods such as massage or acupuncture. Most recently she had several episodes of incontinence with no there urinary symptopms. We will have her follow up with urology and recommended doing kegel exercises.     Plan   Defer labs and imaging to medical oncology.    Scheduled for follow up with Dr Jeannetta Nap in next month.    Seek second opinion with Ortho or Neurosurgery.    Referral to urology for incontinence.    Follow up in 6 months or sooner if needed.     Rodrigo Ran, NP

## 2018-03-17 ENCOUNTER — Ambulatory Visit: Payer: Medicare (Managed Care) | Attending: General Surgery | Admitting: General Surgery

## 2018-03-17 ENCOUNTER — Encounter: Payer: Self-pay | Admitting: Surgical Oncology

## 2018-03-17 VITALS — BP 107/53 | HR 87 | Temp 98.4°F | Wt 152.7 lb

## 2018-03-17 DIAGNOSIS — C259 Malignant neoplasm of pancreas, unspecified: Secondary | ICD-10-CM | POA: Insufficient documentation

## 2018-03-17 DIAGNOSIS — R32 Unspecified urinary incontinence: Secondary | ICD-10-CM | POA: Insufficient documentation

## 2018-03-17 DIAGNOSIS — M542 Cervicalgia: Secondary | ICD-10-CM

## 2018-03-21 ENCOUNTER — Telehealth: Payer: Self-pay | Admitting: Urology

## 2018-03-21 NOTE — Telephone Encounter (Signed)
07/2018 Status: Sch    Time: 9:00 AM Length: 15   Visit Type: NEW PATIENT VISIT [4818] Copay: $0.00   Provider: Lavinia Sharps, NP Department: [facility]     Address: [address], [place] [address]   [place] #:     Appt. Arrival Location      Referring Provider:  CSN: [id number]   HAR:      Referral Id:      Notes: incontinence    Transport Status:      Made On: 03/21/2018 9:25 AM By: Cyndy Freeze   Rescheduled From:        From: Lavinia Sharps, NP   Sent: 03/19/2018  8:58 AM EST   To: Rodrigo Ran, NP, Magda Kiel- can you add pt on my schedule for urinary incontinence?   ----- Message -----   From: Rodrigo Ran, NP   Sent: 03/18/2018  9:46 AM EST   To: Lavinia Sharps, NP     Hi,     Can you see this patient. She is having incontinence that has just started out of nowhere. Really no other symptoms. Told her to do some pelvic floor exercises for the meantime and if she has any other symptoms to follow up with PCP with UA and culture.     Shelia Media          Name: Amanda Galloway, Amanda Galloway MRN: [id number]   Date: 04/09/2018 Status: Sch   Time: 9:00 AM Length: 15   Visit Type: NEW PATIENT VISIT [8676] Copay: $0.00   Provider: Lavinia Sharps, NP Department: [facility]     Address: [address], [place] [address]   [place] #:     Appt. Arrival Location      Referring Provider:  CSN: [id number]   HAR:      Referral Id:      Notes: incontinence    Transport Status:      Made On: 03/21/2018 9:25 AM By: Cyndy Freeze   Rescheduled From:      Rescheduled To:      Letters Printed:      Appointment Question Answers   Answers Entered: Fri Mar 21, 2018 9:25 AM By: Berneta Levins   [place] INTERPRETER QUESTION   Urgent Appointment?   Patient Requested   Patient Demographics for Amanda Galloway, Amanda Galloway [[id number]]     Appt left on pt's voice mail

## 2018-03-25 ENCOUNTER — Encounter: Payer: Self-pay | Admitting: Physical Medicine and Rehabilitation

## 2018-03-25 ENCOUNTER — Ambulatory Visit
Payer: Medicare (Managed Care) | Attending: Physical Medicine and Rehabilitation | Admitting: Physical Medicine and Rehabilitation

## 2018-03-25 VITALS — BP 109/55 | HR 64 | Temp 97.7°F

## 2018-03-25 DIAGNOSIS — M5412 Radiculopathy, cervical region: Secondary | ICD-10-CM | POA: Insufficient documentation

## 2018-03-25 NOTE — Progress Notes (Signed)
Physical Medicine & Rehabilitation  [place] [facility]  [place], [place] [address]  [phone]  Test Date:  03/25/2018    Patient: Amanda Galloway DOB: 09/09/1945 Physician: Orlan Leavens MD   Sex: Female Height: 5\' 6"  Ref Phys: Dr. Denese Killings   ID#: [id number] Weight: 152 lbs. Technician:      Electrodiagnostic Medicine Evaluation     ELECTRODIAGNOSTIC IMPRESSION:  1. Normal study.  2. No electrodiagnostic evidence of radiculopathy, plexopathy, mononeuropathy, or polyneuropathy involving bilateral upper limbs.    Reason for study: Evaluate for chemo or radiation induced neuropathy versus radiculopathy     HPI:  73 year old female with past medical history relevant for cervical disc herniation, right rotator cuff tear, pancreatic cancer hospitalized earlier this past year for 5 months s/p Whipple procedure who is referred for an electrodiagnostic study to evaluate for neuropathy in bilateral upper limbs.  Patient notes that while she was hospitalized she initially experienced some neck pain with radiation down the left arm which has now since moved over to the right side.  She states that it radiates down to the entire right hand.  Endorses numbness and weakness.      Physical Examination  Constitutional: Alert and not in acute distress.  Well developed and well nourished.  Neurological:  Muscle Testing:  Side Deltoid Biceps Triceps Wrist Ext FF (median) FF (ulnar) APB DI       R 5 5 5 5 5 5 5 5    L 5 5 5 5 5 5 5 5       Sensory:  Sensation intact in all dermatomes and peripheral nerve distributions in the upper limbs.    Musculoskeletal: Tender to palpation in the musculature of the right upper trapezius.  Neer's and Hawkins causes some discomfort on the right side.  No atrophy, cyanosis, gross deformity, or edema of involved extremities.    Skin:  No skin lesions were noted in the areas studied today.  No rash or erythema.  Psychiatric: Normal mood and affect.      Nursing note and vitals reviewed.       Procedure explained and informed consent was obtained.  No contraindications to NCS and/or needle EMG were noted.        NCV & EMG Findings:    Limb temperature monitoring, with limb temperature maintained greater than 32C in the upper extremities and greater than 30C in the lower extremities.      All nerve conduction studies (as indicated in the following tables) were within normal limits.   All left vs. right side differences were within normal limits.     All examined muscles (as indicated in the following table) showed no evidence of electrical instability.        Nerve Conduction Studies  Anti Sensory Summary Table     Stim Site NR Peak (ms) Norm Peak (ms) O-P Amp (V) Norm O-P Amp Site1 Site2 Delta-0 (ms) Dist (cm) Vel (m/s) Norm Vel (m/s)   Left Median Anti Sensory (2nd Digit)   Wrist    3.2 <4 19.2 >8         Right Median Anti Sensory (2nd Digit)   Wrist    3.1 <4 11.1 >8         Left Ulnar Anti Sensory (5th Digit)   Wrist    3.7 <4 13.4 >4 Wrist 5th Digit 3.0 14.0 47 >35   Right Ulnar Anti Sensory (5th Digit)   Wrist    3.3 <4 10.4 >  4 Wrist 5th Digit 2.5 14.0 56 >35     Motor Summary Table     Stim Site NR Onset (ms) Norm Onset (ms) O-P Amp (mV) Norm O-P Amp Site1 Site2 Delta-0 (ms) Dist (cm) Vel (m/s) Norm Vel (m/s)   Left Median Motor (Abd Poll Brev)   Wrist    3.4 <4.4 6.1 >3.8 Elbow Wrist 4.4 24.0 55 >51   Elbow    7.8  6.2          Right Median Motor (Abd Poll Brev)   Wrist    3.0 <4.4 7.5 >3.8 Elbow Wrist 4.0 22.0 55 >51   Elbow    7.0  7.0          Left Ulnar Motor (Abd Dig Min)   Wrist    3.1 <3.7 8.8 >7.9 B Elbow Wrist 3.4 19.5 57 >52   B Elbow    6.5  7.9  A Elbow B Elbow 1.9 9.5 50 >43   A Elbow    8.4  7.6          Right Ulnar Motor (Abd Dig Min)   Wrist    2.9 <3.7 8.6 >7.9 B Elbow Wrist 3.4 20.5 60 >52   B Elbow    6.3  7.6  A Elbow B Elbow 2.1 13.0 62 >43   A Elbow    8.4  6.4            EMG     Side Muscle Nerve Root Ins Act Fibs Psw Amp Dur Poly Recrt Int Fraser Din Comment   Right Biceps  Musculocut C5-6 Nml Nml Nml Nml Nml Nml Nml Nml    Right Triceps Radial C6-7-8 Nml Nml Nml Nml Nml Nml Nml Nml    Right FlexCarRad Median C6-7 Nml Nml Nml Nml Nml Nml Nml Nml    Right Ext Digitorum Radial (Post Int) C7-8 Nml Nml Nml Nml Nml Nml Nml Nml    Right BrachioRad Radial C5-6 Nml Nml Nml Nml Nml Nml Nml Nml    Right 1stDorInt Ulnar C8-T1 Nml Nml Nml Nml Nml Nml Nml Nml    Left Biceps Musculocut C5-6 Nml Nml Nml Nml Nml Nml Nml Nml    Left Triceps Radial C6-7-8 Nml Nml Nml Nml Nml Nml Nml Nml    Left 1stDorInt Ulnar C8-T1 Nml Nml Nml Nml Nml Nml Nml Nml            Waveforms:                               Carmelia Roller, MD  PM&R Attending Physician    03/26/18  1:40 PM

## 2018-03-27 ENCOUNTER — Encounter: Payer: Self-pay | Admitting: Anesthesiology

## 2018-03-27 NOTE — Progress Notes (Signed)
I left a message for Amanda Galloway that I looked at her EMG which was normal, but we would still proceed as planned for a cervical ESI which is scheduled already with Dr. Jaquita Rector on 04/18/18.

## 2018-03-31 ENCOUNTER — Encounter: Payer: Self-pay | Admitting: Physical Medicine and Rehabilitation

## 2018-03-31 ENCOUNTER — Ambulatory Visit
Payer: Medicare (Managed Care) | Attending: Physical Medicine and Rehabilitation | Admitting: Physical Medicine and Rehabilitation

## 2018-03-31 VITALS — BP 112/64 | Ht 66.0 in | Wt 152.0 lb

## 2018-03-31 DIAGNOSIS — M5412 Radiculopathy, cervical region: Secondary | ICD-10-CM

## 2018-03-31 DIAGNOSIS — M25511 Pain in right shoulder: Secondary | ICD-10-CM

## 2018-03-31 NOTE — Addendum Note (Signed)
Addended by: Cletus Gash on: 03/31/2018 10:25 AM     Modules accepted: Orders

## 2018-03-31 NOTE — Progress Notes (Addendum)
Amanda Galloway is here today for a follow-up visit.  She has persistent right-sided neck shoulder and arm pain associated with numbness and weakness.  She had a nerve conduction study performed by Dr. Randol Galloway on March 25, 2018.  It showed no electrodiagnostic evidence of radiculopathy, plexopathy, mononeuropathy or polyneuropathy involving bilateral upper limbs.    Patient has been to the pain clinic and epidural steroid injection is being planned.  Patient is currently taking Lyrica, Flexeril, Cymbalta and Percocet when necessary for pain management.    Impression/plan: Chronic right-sided neck and upper extremity pain without definitive evidence of radiculopathy on imaging or nerve testing.  Recommend consult with Orthopedics for eval of right shoulder. Patient will continue care with the pain clinic.

## 2018-04-08 NOTE — Progress Notes (Signed)
Chief Complaint: Urinary incontinence    HPI:  73 y.o. female with history of pancreatic cancer s/p whipple, hypothyroidism, PE, and anemia referred to our office for urinary incontinence.     Patient reports that she noticed urinary urgency and incontinence in the fall.  States that once a week she will get the urge to urinate and is unable to make it to the bathroom in time.     Patient does not wear pads for incontinence. On average consumes 8 glasses of water throughout the day. No caffeinated beverages. Denies constipation.     Does not gets up once at night to urinate. No gross hematuria or dysuria.     CT scan completed on 01/28/18. There was no hydronephrosis, stones, or masses. Creatinine 1.02 on 12/27/17.     No history of urinary tract infections. No history of kidneys stones. No family history of kidney or bladder cancer. Mother with breast cancer. Father with lymphoma. No tobacco use.     History of hysterectomy, appendectomy and cholecystectomy.     No Known Allergies (drug, envir, food or latex)    Past Medical History:   Diagnosis Date    Acute kidney failure 03/31/2017    Cancer     Depression     Fibromyalgia     Hypothyroidism     Pancreatic cancer        Past Surgical History:   Procedure Laterality Date    APPENDECTOMY      CERVICAL FUSION      CHOLECYSTECTOMY      CHOLECYSTECTOMY, LAPAROSCOPIC  09/06/2016    HYSTERECTOMY  08/1986    Fibroids    KNEE SURGERY Right     PR INSERT TUNNELED CV CATH WITH PORT Right 10/04/2016    Procedure: Right IJ MEDIPORT Insertion;  Surgeon: Delano Metz, MD;  Location: [facility] MAIN OR;  Service: Oncology General    PR LAP,DIAGNOSTIC ABDOMEN N/A 10/04/2016    Procedure: LAPAROSCOPY DIAGNOSTIC;  Surgeon: Delano Metz, MD;  Location: [facility] MAIN OR;  Service: Oncology General    PR PART [place] PANC,PROX+REMV DUOD+ANAST N/A 03/29/2017    Procedure: WHIPPLE PROCEDURE;  Surgeon: Delano Metz, MD;  Location: [facility] MAIN OR;  Service: Oncology General    ROTATOR CUFF REPAIR  Right     TONSILLECTOMY AND ADENOIDECTOMY      whipple         Current Outpatient Medications   Medication Sig    oxyCODONE-acetaminophen (PERCOCET) 7.5-325 MG per tablet Take 1 tablet by mouth 4 times daily as needed for Pain Max daily dose: 4 tablets    pantoprazole (PROTONIX) 40 MG EC tablet TAKE 1 TABLET BY MOUTH EVERY DAY. SWALLOW WHOLE, DO NOT BREAK, CRUSH OR CHEW.    pregabalin (LYRICA) 150 MG capsule Take 1 capsule (150 mg total) by mouth 2 times daily Max daily dose: 300 mg    cyclobenzaprine (FLEXERIL) 10 MG tablet Take 1 tablet (10 mg total) by mouth nightly as needed for Muscle spasms    pancrelipase (CREON 24) 24000-76000 units DR capsule Take 1-3 capsules prior to meals and 1-2 prior to snacks    DULoxetine (CYMBALTA) 20 MG DR capsule Take 2 capsules (40 mg total) by mouth every evening    ferrous sulfate 325 (65 FE) MG tablet Take 1 tablet (325 mg total) by mouth daily (with breakfast)    levothyroxine (SYNTHROID, LEVOTHROID) 150 MCG tablet Take 1 tablet (150 mcg total) by mouth daily (before breakfast)    PREMARIN 0.625 MG  tablet Take 1 tablet (0.625 mg total) by mouth every morning    triamterene-hydrochlorothiazide (DYAZIDE) 37.5-25 MG per capsule Take 1 capsule by mouth every morning    Red Yeast Rice Extract (RED YEAST RICE PO) Take by mouth    docusate sodium (COLACE) 100 MG capsule Take 2 capsules (200 mg total) by mouth 2 times daily    ondansetron (ZOFRAN-ODT) 4 MG disintegrating tablet Take 1 tablet (4 mg total) by mouth 3 times daily as needed (nausea)   Place on top of tongue.    SUMAtriptan (IMITREX) 25 MG tablet Take 1 tablet (25 mg total) by mouth as needed for Migraine   Take at onset of headache. May repeat once in 2 hours.    rollator Use as directed.    senna (SENOKOT) 8.6 MG tablet Take 2 tablets by mouth 2 times daily    Vitamins - senior (CENTRUM SILVER) TABS Take 1 tablet by mouth daily (Patient taking differently: Take 1 tablet by mouth every morning   )        Family History   Problem Relation Age of Onset    Breast cancer Mother         died 91    Cancer Father         NHL, died 77    Heart Disease Father     Diabetes Sister     Obesity Sister         4 siblings    No Known Problems Daughter        Social History     Socioeconomic History    Marital status: Divorced     Spouse name: Not on file    Number of children: Not on file    Years of education: Not on file    Highest education level: Not on file   Tobacco Use    Smoking status: Never Smoker    Smokeless tobacco: Never Used   Substance and Sexual Activity    Alcohol use: No    Drug use: No    Sexual activity: Never     Partners: Male   Other Topics Concern    Not on file   Social History Narrative    Lives with daughter in [place]    Previously Glass blower/designer for [employer], now retired    Ex-husband in [place]       ROS:   Constitutional: Negative.  HEENT: Negative   Respiratory: Negative  Cardiac: Negative  GI: Negative  GU: See above.  Musculoskeletal: Negative  Neurological: confusion since prolonged hospitalization.   Hematological: anemia and PE  Behavorial: Negative  Skin: Negative  Endocrine:Hypothyroidism   Vascular:Negative    Physical Exam:  BP 109/67    Pulse 71    Ht 1.689 m (5' 6.5")    Wt 68 kg (150 lb)    BMI 23.85 kg/m     GENERAL:  No acute distress, well developed, well nourished.  NEUROLOGIC:  Oriented to person, place, time, and situation.  PSYCHIATRIC:  Normal mood and affect.  HEENT:  Normocephalic, atraumatic.  SKIN:  Normal color, turgor, texture, hydration.  RESPIRATORY:  Respirations unlabored.   HEART:  Regular rate and rhythm   LYMPHATIC:  Normal cervical, supraclavicular, and inguinal examination.  BACK/ORTHO:  No costovertebral angle tenderness.  No tenderness of the axial skeleton.  No obvious back deformities.  ABDOMINAL:  Abdomen soft,nontender, nondistended, and without masses. No signs of inguinal and umbilical hernias. Surgical scars present.  EXTREMITIES:  Trace bilateral lower extremity edema   GENITOURINARY: Anatomically normal external genitalia.  Urethral meatus visibly normal without discharge or caruncle.  Urethra and bladder neck palpably normal without masses, induration, or evidence of diverticulum.  Bimanual pelvic examination without suspicious masses, tenderness, or discharge.  No pelvic organ prolapse or cervical motion tenderness.  Pelvic floor musculature nontender to palpation.    Recent Results (from the past 24 hour(s))   POCT Bladder Scan PVR    Collection Time: 04/09/18  9:15 AM   Result Value Ref Range    Residual mL 20 mL    POCT urinalysis dipstick    Collection Time: 04/09/18  9:18 AM   Result Value Ref Range    Specific gravity,UA POCT 1.020 1.002 - 1.030    PH,UA POCT 5.0 5 - 8    Leuk Esterase,UA POCT +2 (!) Negative    Nitrite,UA POCT Negative Negative    Protein,UA POCT Trace (!) Negative mg/dL    Glucose,UA POCT Normal Normal mg/dL    Ketones,UA POCT + small Negative mg/dL    Urobilinogen,UA      Bilirubin,Ur      Blood,UA POCT Negative Negative    Exp date 03/05/2019     Lot # [id number]        IMPRESSION: 73 y.o. female with history of pancreatic cancer, hypothyroidism, PE, and anemia referred to our office for urinary incontinence and urgency.      PLAN:  1. Low PVR, demonstrating that the patient is emptying her bladder.   2. We discussed behavioral modifications, such as avoiding bladder irritants (caffeine, alcohol, spicy foods and acidic foods), bowel regimen to prevent constipation.   3. Myrbetriq ordered for urinary symptoms- instructed patient to monitor BP  4. Follow up visit in one month with PVR.    Lavinia Sharps, NP

## 2018-04-09 ENCOUNTER — Ambulatory Visit: Payer: Medicare (Managed Care) | Attending: Urology | Admitting: Urology

## 2018-04-09 ENCOUNTER — Encounter: Payer: Self-pay | Admitting: Urology

## 2018-04-09 ENCOUNTER — Telehealth: Payer: Medicare (Managed Care)

## 2018-04-09 VITALS — BP 109/67 | HR 71 | Ht 66.5 in | Wt 150.0 lb

## 2018-04-09 DIAGNOSIS — R32 Unspecified urinary incontinence: Secondary | ICD-10-CM

## 2018-04-09 LAB — POCT URINALYSIS DIPSTICK
Blood,UA POCT: NEGATIVE
Glucose,UA POCT: NORMAL mg/dL
Leuk Esterase,UA POCT: 2 — AB
Lot #: 41518004
Nitrite,UA POCT: NEGATIVE
PH,UA POCT: 5 (ref 5–8)
Specific gravity,UA POCT: 1.02 (ref 1.002–1.030)

## 2018-04-09 LAB — POCT BLADDER SCAN PVR: Residual mL: 20

## 2018-04-09 MED ORDER — MIRABEGRON ER 25 MG PO TB24 *I*
25.0000 mg | ORAL_TABLET | Freq: Every day | ORAL | 3 refills | Status: DC
Start: 2018-04-09 — End: 2018-05-14

## 2018-04-09 NOTE — Telephone Encounter (Signed)
Pre call for February 14 Friday with Dr. Jaquita Rector for Amanda Galloway. Patient will arrive at 11:00am and bring a driver.  Patient can eat and take regularly scheduled medications.  If ill or placed on antibiotics, patient will call [phone].

## 2018-04-11 ENCOUNTER — Encounter: Payer: Self-pay | Admitting: Gastroenterology

## 2018-04-15 ENCOUNTER — Other Ambulatory Visit: Payer: Self-pay | Admitting: Gastroenterology

## 2018-04-18 ENCOUNTER — Encounter: Payer: Self-pay | Admitting: Gastroenterology

## 2018-04-18 ENCOUNTER — Encounter: Payer: Medicare (Managed Care) | Admitting: Pain Medicine

## 2018-04-22 ENCOUNTER — Other Ambulatory Visit: Payer: Self-pay | Admitting: Pain Medicine

## 2018-04-22 ENCOUNTER — Ambulatory Visit: Payer: Medicare (Managed Care) | Admitting: Sports Medicine

## 2018-04-22 MED ORDER — OXYCODONE-ACETAMINOPHEN 7.5-325 MG PO TABS *A*
1.0000 | ORAL_TABLET | Freq: Four times a day (QID) | ORAL | 0 refills | Status: DC | PRN
Start: 2018-04-22 — End: 2018-06-06

## 2018-04-22 NOTE — Telephone Encounter (Signed)
o Pt called requesting refill on:    o # 1 medication ___oxyCODONE-acetaminophen (PERCOCET) 7.5-325 MG per tablet_____________        o Pt denies (or reports) side effects yes____no_x___    o Last urine screen date: _____none_______    o Next appt with pain provider: date___none_______    o Pt reports pharmacy verified___y_____

## 2018-04-30 ENCOUNTER — Encounter: Payer: Self-pay | Admitting: Gastroenterology

## 2018-04-30 ENCOUNTER — Encounter: Payer: Self-pay | Admitting: Oncology

## 2018-04-30 ENCOUNTER — Ambulatory Visit: Payer: Medicare (Managed Care) | Attending: Oncology | Admitting: Oncology

## 2018-04-30 VITALS — BP 107/53 | HR 75 | Temp 96.8°F | Resp 16 | Wt 157.7 lb

## 2018-04-30 DIAGNOSIS — C259 Malignant neoplasm of pancreas, unspecified: Secondary | ICD-10-CM | POA: Insufficient documentation

## 2018-04-30 NOTE — Progress Notes (Addendum)
MEDICAL ONCOLOGY PROGRESS NOTE    Reason for Evaluation: Follow-up    Diagnosis: Pancreatic adenocarcinoma    Stage: Locally advanced    Performance Status: ECOG PS 1-2    Diagnosis and Treatment History:  1. Over several months, patient noted worsening abdominal pain and weight loss.  Extensive work-up including EGD, colonoscopy and imaging were unrevealing.  After suggestive HIDA scan, she underwent cholecystectomy on September 06, 2016 but symptoms persisted.      2. MRCP on September 25, 2016 demonstrated 2x2cm soft tissue mass-like abnormality in celiac region, contiguous with medial margin of uncinate process of pancreas concerning for pancreatic lession vs lymphadenopathy.    3. EUS/FNA on September 27, 2016 demonstrated a mass in pancreas uncinate process and lymph node pressing celiac axis. FNA confirmed adenocarcinoma.    4. She was transferred to [facility] for additional management.  Abd/pelvis CT on September 30, 2016 revealed ill-defined prominent retroperitoneal soft tissue with central low attenuation suggestive of necrosis which abuts SMA and is adjacent to pancreatic uncinate process similar to prior MRI. Differential includes pancreatic neoplasm versus lymphadenopathy.  CT angio on September 29, 2016 showed no pulmonary embolism.  Nonspecific 3 mm nodules in right upper and middle lobe, recommend attention on follow-up imaging.      5. Diagnostic laparoscopy and port placement by Dr. Ron Galloway on October 04, 2016 - no evidence of metastatic disease.    6.Neoadjuvant SBRT 25 Gy in 5 fractions pancreatic cancer, with concurrent boost to 30 Gy in 5 fractions to tumor around SMA from 02/13/17-03/08/17    7. She underwent a Whipple procedure with Dr. Delano Metz, MD on 03/29/17.  Her postoperative course was complicated by intra-abdominal abscess, abdominal pain, gastrostomy tube induced gastric anastomotic dehiscence , and  prolonged hospitalization (with multiple readmissions)      Treatment Hx: FOLFIRINOX chemotherapy - Cycle 1 (in-house)  on October 05, 2016.  Cycle 2 on October 19, 2016.  Cycle 3 on November 02, 2016.  Cycle 4 on November 16, 2016.  Cycle 5 on November 27, 2016. Cycle 6 on December 14, 2016.  Cycle 7 on December 28, 2016. Cycle 8 on January 11, 2017.    Interval History:    She came today for follow up accompanied by her daughter   Overall she is doing better    He wt has been stable    No Abd pain    C/o of shoulder pain secondary from rotator cuff tear    Past Medical/Surgical History:  1. Fibromyalgia  2. Hypothyroidism  3. S/p knee surgery  4. S/p hysterectomy  5. S/p shoulder surgery  6. S/p tonsillectomy    Current Medications:  Current Outpatient Medications on File Prior to Visit   Medication Sig Dispense Refill    oxyCODONE-acetaminophen (PERCOCET) 7.5-325 MG per tablet Take 1 tablet by mouth 4 times daily as needed for Pain Max daily dose: 4 tablets 120 tablet 0    mirabegron (MYRBETRIQ) 25 MG 24 hr tablet Take 1 tablet (25 mg total) by mouth daily 30 tablet 3    pantoprazole (PROTONIX) 40 MG EC tablet TAKE 1 TABLET BY MOUTH EVERY DAY. SWALLOW WHOLE, DO NOT BREAK, CRUSH OR CHEW. 30 tablet 1    pregabalin (LYRICA) 150 MG capsule Take 1 capsule (150 mg total) by mouth 2 times daily Max daily dose: 300 mg 60 capsule 2    cyclobenzaprine (FLEXERIL) 10 MG tablet Take 1 tablet (10 mg total) by mouth nightly as needed for Muscle spasms  30 tablet 2    pancrelipase (CREON 24) 24000-76000 units DR capsule Take 1-3 capsules prior to meals and 1-2 prior to snacks 180 capsule 6    DULoxetine (CYMBALTA) 20 MG DR capsule Take 2 capsules (40 mg total) by mouth every evening 180 capsule 6    ferrous sulfate 325 (65 FE) MG tablet Take 1 tablet (325 mg total) by mouth daily (with breakfast) 100 tablet 3    levothyroxine (SYNTHROID, LEVOTHROID) 150 MCG tablet Take 1 tablet (150 mcg total) by mouth daily (before breakfast) 90 tablet 6    PREMARIN 0.625 MG tablet Take 1 tablet (0.625 mg total) by mouth every morning 90 tablet 3     triamterene-hydrochlorothiazide (DYAZIDE) 37.5-25 MG per capsule Take 1 capsule by mouth every morning 30 capsule 5    Red Yeast Rice Extract (RED YEAST RICE PO) Take by mouth      docusate sodium (COLACE) 100 MG capsule Take 2 capsules (200 mg total) by mouth 2 times daily 120 capsule 5    ondansetron (ZOFRAN-ODT) 4 MG disintegrating tablet Take 1 tablet (4 mg total) by mouth 3 times daily as needed (nausea)   Place on top of tongue. 30 tablet 0    SUMAtriptan (IMITREX) 25 MG tablet Take 1 tablet (25 mg total) by mouth as needed for Migraine   Take at onset of headache. May repeat once in 2 hours. 9 tablet 1    rollator Use as directed. 1 each 0    senna (SENOKOT) 8.6 MG tablet Take 2 tablets by mouth 2 times daily 120 tablet 5    Vitamins - senior (CENTRUM SILVER) TABS Take 1 tablet by mouth daily (Patient taking differently: Take 1 tablet by mouth every morning   ) 30 tablet 0     No current facility-administered medications on file prior to visit.        Social History: Divorced.  Worked in [employer].  Daughter Amanda Galloway) worked on [employer] and now in home care.    Family History: Mother with breast cancer.  Father with cancer of unknown type.    Review of Systems:   Review of Systems   Constitutional: Positive for fatigue. Negative for fever and unexpected weight change.   HENT:  Negative.    Respiratory: Negative.    Cardiovascular: Negative.    Gastrointestinal: Negative.    Genitourinary: Negative.     Skin: Negative.    Neurological: Negative.      Physical Exam:  Vitals:    04/30/18 1014   BP: 107/53   Pulse: 75   Resp: 16   Temp: 36 C (96.8 F)   Weight: 71.6 kg (157 lb 11.8 oz)     GEN: Well appearing , NAD  HEENT: PERRL, MMM  CVS: S2, S2, RRR  PULM: CTABL  EXT: no edema BL  CNS: A& A x4. non focal   ABD: soft, no tenderness to palpation, no organomegaly, + mid abdomen surgical scar, G-tube wound/opening covered with wound dressing sounded by ~ cm erythema and oozing yellow/gray  fluid.           Assessment and Plan:       Amanda Galloway 73 y.o. female with  locally advanced pancreatic adenocarcinoma that was treated neoadjuvant FOLFIRINOX (with Neulasta support).    After she completed Cycle 8 of FOLFIRINOX on 01/11/17, the restaging scans show stable disease. Hence, she underwent Neoadjuvant SBRT from 02/13/17-03/08/17 and Whipple procedure with Dr. Delano Metz, MD on 03/29/17. However,  the postoperative course was complicated by prolonged hospitalization and multiple readmissions (Jan-July 2019) secondary to abscesses, gastric outlet obstruction, pain, malnutrition, and wound dehiscence.  The final pathology of the Whipple was only remarkable for a small focus (0.2 cm) of high-grade pancreatic intraepithelial neoplasia. There was no lymph node involvement or positive margins.     Today, she presented to the clinic for follow up     Clinically: doing well, continue to get stronger   Imaging: scans from Nov 2019: no evidence of diseased progression    We discussed:  Scans, and the surveillance plan     In summary, she continues to do well. She has no clinical or radiological evidence of disease progression     Plan:      Check CA 19   Restaging scans is due in 3 months then annually (sooner if needed)    Follow up with Dr. Jeannetta Nap in 6 months     Pt seen and discussed with Dr. Edison Pace, [place]  Fellow, Hematology/Oncology  Laurice Record. [place].    I have seen an examined the patient and agree with the above outlined findings and plans as discussed with the fellow and the patient.    Amanda Galloway

## 2018-05-01 NOTE — Progress Notes (Signed)
[facility] SOCIAL WORK:   [facility] Social Work Therapist, nutritional:     Patient referred based on distress screen?: Yes      Social Work contact made?: Yes      Method of contact:  Telephone    Other method of contact:  Spoke with pt phone # [phone]  Referral:     Time spent on this encounter (in minutes):  15    Pt with positive Distress Screen.  Telephone call to pt today to check in and offer SW support; she was pleasant and receptive to speaking with me.  We reviewed SW role/availability and I provided her with my contact information.  We also reviewed community resources and cancer supports for patients and caregivers.    Pt indicated her shoulder pain is causing her distress and she is actively working with medical team on this.  Overall, she feels that she is managing well at this time.  She is staying at her supportive daughter's home.  Daughter is a Marine scientist here in the [place].    Pt was appreciative of my call.  She does not identify any SW needs at this time, but agrees to contact me as needed for psychosocial support.

## 2018-05-02 ENCOUNTER — Other Ambulatory Visit: Payer: Self-pay | Admitting: Internal Medicine

## 2018-05-07 ENCOUNTER — Ambulatory Visit: Payer: Medicare (Managed Care) | Admitting: Urology

## 2018-05-09 ENCOUNTER — Encounter: Payer: Self-pay | Admitting: Gastroenterology

## 2018-05-11 ENCOUNTER — Encounter: Payer: Self-pay | Admitting: Internal Medicine

## 2018-05-12 ENCOUNTER — Other Ambulatory Visit: Payer: Self-pay

## 2018-05-14 ENCOUNTER — Other Ambulatory Visit: Payer: Self-pay | Admitting: Internal Medicine

## 2018-05-14 MED ORDER — PANTOPRAZOLE SODIUM 40 MG PO TBEC *I*
40.0000 mg | DELAYED_RELEASE_TABLET | Freq: Every day | ORAL | 6 refills | Status: DC
Start: 2018-05-14 — End: 2018-12-22

## 2018-05-14 NOTE — Telephone Encounter (Signed)
rx sent -DP

## 2018-05-20 ENCOUNTER — Other Ambulatory Visit: Payer: Self-pay | Admitting: Internal Medicine

## 2018-05-21 ENCOUNTER — Telehealth: Payer: Self-pay | Admitting: Physical Medicine and Rehabilitation

## 2018-05-21 ENCOUNTER — Other Ambulatory Visit: Payer: Self-pay | Admitting: Physical Medicine and Rehabilitation

## 2018-05-21 NOTE — Telephone Encounter (Signed)
Left message      Per MD response for refill request for pregablin (lyrica)       Medication Refill     Lenard Simmer, MD  You 56 minutes ago (10:04 AM)      Patient is under care of pain clinic and should receive refills through them or pcp    Routing comment       You routed conversation to Lenard Simmer, MD 2 hours ago (8:39 AM)      You 2 hours ago (8:39 AM)             Last seen 03/31/18  However patient advised to continue care with pain clinic  Med refill refuse? Direct patient to pain clinic?           Documentation

## 2018-05-21 NOTE — Telephone Encounter (Signed)
Last seen 03/31/18  However patient advised to continue care with pain clinic  Med refill refuse? Direct patient to pain clinic?

## 2018-05-25 ENCOUNTER — Other Ambulatory Visit: Payer: Self-pay | Admitting: General Orthopedics

## 2018-05-25 MED ORDER — PREGABALIN 150 MG PO CAPS *I*
150.0000 mg | ORAL_CAPSULE | Freq: Two times a day (BID) | ORAL | 0 refills | Status: AC
Start: 2018-05-25 — End: 2018-05-26

## 2018-05-25 NOTE — Progress Notes (Signed)
Provider paged by Betha Loa ([address]) pharmacist. Reportedly, patient arrived at pharmacy seeming to exhibit withdrawal symptoms, including sweating, agitation. She is on Lyrica 150mg  BID prescribed by Dr. Carrie Mew and has no more refills. Pharmacist requesting a 1-day supply for the patient until she is able to reach out to her PCP on Monday.     A prescription for two 150mg  pills were written.    Hale Drone, MD  Physical Medicine & Rehabilitation, PGY-3

## 2018-05-26 ENCOUNTER — Telehealth: Payer: Self-pay | Admitting: Physical Medicine and Rehabilitation

## 2018-05-26 ENCOUNTER — Other Ambulatory Visit: Payer: Self-pay

## 2018-05-26 MED ORDER — PREGABALIN 150 MG PO CAPS *I*
150.0000 mg | ORAL_CAPSULE | Freq: Two times a day (BID) | ORAL | 2 refills | Status: DC
Start: 2018-05-26 — End: 2018-08-19

## 2018-05-26 NOTE — Telephone Encounter (Signed)
Spoke to patient  She will reach out to PCP  Please see message from yesterday from On Call MD

## 2018-05-26 NOTE — Telephone Encounter (Signed)
Patient state that her Surgeon provider state that, the patient primary care physical have to prescribe this medication. Patient state that she going on withdraw and she really need this medication.

## 2018-06-05 ENCOUNTER — Other Ambulatory Visit: Payer: Self-pay | Admitting: Internal Medicine

## 2018-06-06 ENCOUNTER — Other Ambulatory Visit: Payer: Self-pay | Admitting: Pain Medicine

## 2018-06-06 MED ORDER — OXYCODONE-ACETAMINOPHEN 7.5-325 MG PO TABS *A*
1.0000 | ORAL_TABLET | Freq: Four times a day (QID) | ORAL | 0 refills | Status: DC | PRN
Start: 2018-06-06 — End: 2018-07-15

## 2018-06-06 NOTE — Telephone Encounter (Signed)
Requested Prescriptions     Pending Prescriptions Disp Refills    oxyCODONE-acetaminophen (PERCOCET) 7.5-325 MG per tablet 120 tablet 0     Sig: Take 1 tablet by mouth 4 times daily as needed for Pain Max daily dose: 4 tablets     Refill request for Percocet received. We will honor this request and send to [facility]. (#120).     ISTOP Reference #: [id number]    Vladimir Creeks, NP

## 2018-06-10 ENCOUNTER — Other Ambulatory Visit: Payer: Self-pay | Admitting: Internal Medicine

## 2018-06-30 ENCOUNTER — Other Ambulatory Visit
Admission: RE | Admit: 2018-06-30 | Discharge: 2018-06-30 | Disposition: A | Discharge status: Home / Self Care | Payer: Medicare (Managed Care) | Source: Ambulatory Visit | Attending: Oncology | Admitting: Oncology

## 2018-06-30 DIAGNOSIS — C259 Malignant neoplasm of pancreas, unspecified: Secondary | ICD-10-CM | POA: Insufficient documentation

## 2018-06-30 LAB — CA 19 9 (EFF. 01-2011): CA 19 9 (eff. 01-2011): 59 U/mL — ABNORMAL HIGH (ref 0–35)

## 2018-07-10 ENCOUNTER — Telehealth: Payer: Self-pay | Admitting: Oncology

## 2018-07-10 ENCOUNTER — Ambulatory Visit
Admission: AD | Admit: 2018-07-10 | Discharge: 2018-07-10 | Disposition: A | Discharge status: Home / Self Care | Payer: Medicare (Managed Care) | Source: Ambulatory Visit | Attending: Oncology | Admitting: Oncology

## 2018-07-10 DIAGNOSIS — R0602 Shortness of breath: Secondary | ICD-10-CM

## 2018-07-10 DIAGNOSIS — Z20828 Contact with and (suspected) exposure to other viral communicable diseases: Secondary | ICD-10-CM | POA: Insufficient documentation

## 2018-07-10 LAB — COVID-19 PCR: COVID-19 PCR: 0

## 2018-07-10 NOTE — Telephone Encounter (Addendum)
Spoke to Amanda Galloway who states she had a fall this morning and is "unsure what happened, she just fell back and hit her head".  She denies any injury or lump on her head.  Lue also states that she has been short of breath for the last week or so and is audibly winded during our conversation.  Discussed with Carolyne Littles, PA and the plan will be for Tianne to be swabbed for Covid and to a have CT angio asap.  If the CT angio is too far out and SOB persists or worsens and the Covid swab is negative, Vikki Ports would like Amanda Galloway to go to ED for urgent evaluation.  We do not recommend Nethra drive herself given symptoms.  Sanjana verbalized understanding and asking I call her daughter Amanda Galloway with plan.    Daughter Amanda Galloway notified of plan and will take her mom to urgent care for Covid swab and to ED if SOB persists and we are unable to obtain CT angio quickly.     Updated daughter Amanda Galloway that CT angio is scheduled for tomorrow at 6:40pm @ [facility].  [name] verbalized understanding and appreciative of call.

## 2018-07-11 ENCOUNTER — Ambulatory Visit
Admission: RE | Admit: 2018-07-11 | Discharge: 2018-07-11 | Disposition: A | Discharge status: Home / Self Care | Payer: Medicare (Managed Care) | Source: Ambulatory Visit | Attending: Oncology | Admitting: Oncology

## 2018-07-11 ENCOUNTER — Encounter: Payer: Self-pay | Admitting: Radiology

## 2018-07-11 DIAGNOSIS — Z8507 Personal history of malignant neoplasm of pancreas: Secondary | ICD-10-CM | POA: Insufficient documentation

## 2018-07-11 DIAGNOSIS — J9811 Atelectasis: Secondary | ICD-10-CM | POA: Insufficient documentation

## 2018-07-11 DIAGNOSIS — R918 Other nonspecific abnormal finding of lung field: Secondary | ICD-10-CM | POA: Insufficient documentation

## 2018-07-11 DIAGNOSIS — R0602 Shortness of breath: Secondary | ICD-10-CM

## 2018-07-11 LAB — POCT CREATININE
Creatinine, POCT: 1 mg/dL — ABNORMAL HIGH (ref 0.51–0.95)
GFR,Black POC: 65 *
GFR,Other POC: 56 *

## 2018-07-11 MED ORDER — IOHEXOL 350 MG/ML (OMNIPAQUE) IV SOLN *I*
1.0000 mL | Freq: Once | INTRAVENOUS | Status: AC
Start: 2018-07-11 — End: 2018-07-11
  Administered 2018-07-11: 70 mL via INTRAVENOUS

## 2018-07-14 ENCOUNTER — Telehealth: Payer: Self-pay | Admitting: Pain Medicine

## 2018-07-14 NOTE — Telephone Encounter (Signed)
Pt calling for med refill- will be out of meds tomorrow. Dr Jaquita Rector not available for phone call until Thursday. Telemed set with [facility] for tomorrow

## 2018-07-15 ENCOUNTER — Ambulatory Visit: Payer: Medicare (Managed Care) | Admitting: Pain Medicine

## 2018-07-15 ENCOUNTER — Encounter: Payer: Self-pay | Admitting: Pain Medicine

## 2018-07-15 DIAGNOSIS — M25511 Pain in right shoulder: Secondary | ICD-10-CM

## 2018-07-15 DIAGNOSIS — G8929 Other chronic pain: Secondary | ICD-10-CM

## 2018-07-15 MED ORDER — OXYCODONE-ACETAMINOPHEN 7.5-325 MG PO TABS *A*
1.0000 | ORAL_TABLET | Freq: Four times a day (QID) | ORAL | 0 refills | Status: DC | PRN
Start: 2018-07-15 — End: 2018-08-05

## 2018-07-15 NOTE — Progress Notes (Signed)
Telephone Visit     This is an established patient visit.    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       HPI:  Patient was seen for initial consultation with this service 02/25/2018. A cervical epidural steroid injection was scheduled 04/18/18 (EMG done 03/25/18 was normal).  The cervical epidural injection was cancelled as patient saw Dr Sula Rumple ([place])     Patient with     Patient was scheduled for right shoulder bicep surgery 06/09/18 which was cancelled due to COVID-19 pandemic.  She is waiting for surgery to be rescheduled.     Patient lives with her adult daughter.  Last week (07/10/18) she was walking down stairs and fell backwards hitting her head and falling onto right arm.  She also had been short of breath/ winded 'for a few days' though denies any fevers, nausea, vomiting, diarrhea or rashes.  Her oncologist scheduled CT Angio and COVID testing which was negative.  Patient denies any other falls but reports having intermittent breathlessness.     Patient has been using percocet, pregabalin, duloxetine, and cyclobenzaprine to help control pain symptoms and reports that all medications help to control pain intensity somewhat.  She denies any significant side effects related to medication use.     Appetite and sleep are variable but generally stable.  Patient able to manage self-care.    Current medications prescribed by Pain Service:  Oxycodone/ acetaminophen 7.5/325 every 6 hours as needed.    Pain medications prescribed by primary provider:  pregabalin  Duloxetine  Cyclobenzaprine  trazodone    Patient's problem list, allergies, and medications were reviewed and updated as appropriate. Please see the EHR for full  details.    IMAGES:  04/15/08 right shoulder MRI indicated: partial articular tear of anterior margin infraspinatus; Intra-articular portion of long head bicep tendon not visulaized and presumably torn and retracted; small posterior superior labral tear    Physical Exam:    This visit was performed during a pandemic event, thus the physical exam was not performed.    Assessment Plan:  Consent was previously obtained from the patient to complete this telephone consult; including the potential for financial liability.    Patient was awaiting right shoulder bicep surgery that was cancelled due to COVID-19 pandemic.  Her right shoulder/ arm pain has been constant and only moderately controlled with use of percocet and additional adjuvant medications.  Patient denies side effects related to medication use.  She has been responsible with medications. [facility] PMP registry reviewed 07/15/18 , Reference # [id number].    Will continue with current dosing of percocet.  Anticipate gradual reduction of percocet post right shoulder surgery and PT rehab in future.  Will schedule patient for telehome follow-up visit in 4 weeks.     The plan was discussed with the patient who demonstrated understanding to the provider's satisfaction.    21+ minutes were spent on the phone with the patient.       Anne Fu, NP

## 2018-07-15 NOTE — Patient Instructions (Signed)
Consent was previously obtained from the patient to complete this telephone consult; including the potential for financial liability.    Patient was awaiting right shoulder bicep surgery that was cancelled due to COVID-19 pandemic.  Her right shoulder/ arm pain has been constant and only moderately controlled with use of percocet and additional adjuvant medications.  Patient denies side effects related to medication use.  She has been responsible with medications. [facility] PMP registry reviewed 07/15/18 , Reference # [id number].    Will continue with current dosing of percocet.  Anticipate gradual reduction of percocet post right shoulder surgery and PT rehab in future.  Will schedule patient for telehome follow-up visit in 4 weeks.     The plan was discussed with the patient who demonstrated understanding to the provider's satisfaction.    21+ minutes were spent on the phone with the patient.

## 2018-07-18 ENCOUNTER — Telehealth: Payer: Self-pay | Admitting: Oncology

## 2018-07-18 DIAGNOSIS — R0602 Shortness of breath: Secondary | ICD-10-CM

## 2018-07-18 NOTE — Telephone Encounter (Signed)
Called [name] to relay CT results done this week.    She remains with some increased dyspnea when talking on the phone.    Ct scans show new pulm nodules which I discussed could be related to cancer or something else entirely.    I suggested given she is symptomatic a evaluation by pulmonology makes sense and if a diagnostic procedure is not attempted sooner then a f/U Ct in 6 weeks.    Amanda Galloway

## 2018-07-25 ENCOUNTER — Ambulatory Visit: Payer: Medicare (Managed Care) | Admitting: Pulmonology

## 2018-07-25 ENCOUNTER — Encounter: Payer: Self-pay | Admitting: Pulmonology

## 2018-07-25 ENCOUNTER — Ambulatory Visit: Payer: Medicare (Managed Care) | Admitting: Oncology

## 2018-07-25 DIAGNOSIS — R0600 Dyspnea, unspecified: Secondary | ICD-10-CM

## 2018-07-25 NOTE — Progress Notes (Signed)
Telephone Visit     This is a new patient visit.    Reason for visit: No chief complaint on file.      HPI:  Ms.: Is a 73 year old never smoker with a history of pancreatic cancer status post Whipple who has been referred for evaluation of pulmonary nodules seen on a recent CT scan obtained because of a fall.  Her history is most notable for locally advanced pancreatic adenocarcinoma that was treated with neoadjuvant FOLFIRINOX and neoadjuvant SBRT followed by a Whipple, with final pathology showing only a 2 mm foci of high-grade pancreatic intraepithelial neoplasia.  Her course was complicated by multiple hospitalizations in the early part of 2019 due to abdominal abscesses, but since then she been home doing well albeit less stable on her feet of late per her daughter [name].  She had a fall stepping from a landing to a step and called oncology and as part of her evaluation underwent a CT angiogram which showed subcentimeter pulmonary nodules prompting today's telephone consultation.    She reports that she has had noticeable shortness of breath for at least the last 2 weeks while talking, but has had some that preceded the 2 weeks.  She has not experienced any lower extremity swelling or orthopnea.  No chest pain or chest tightness.  No significant cough, but she does have chronic throat clearing.  She has not had any recent respiratory tract infections.    She does note being dizzy with standing, and somewhat unstable of her feet.    Prior gym - would work out for 1 hr on treadmill, 18mn weight.  No gym time since January due to bad shoulder.      Pets:  Dog, 3 cats.    Cancer Screening:  Previously UTD and normal.  +basal cell, no melanoma  Hobbies:  None currently. Previously knitted.  Shoulder limits    Review of Systems   Constitutional: Negative for chills, fever and weight loss.   Respiratory: Positive for shortness of breath.    Cardiovascular: Negative for chest pain, palpitations, orthopnea and leg  swelling.   Gastrointestinal: Positive for abdominal pain (she attributes to scar tissue) and nausea.   Neurological: Positive for dizziness.   Remainder of full ROS negative.    Patient's problem list, allergies, and medications were reviewed and updated as appropriate. Please see the EHR for full details.    Physical Exam:    This visit was performed during a pandemic event, thus the physical exam was not performed.    Her imaging was personally reviewed and compared, and discussed in detail on the phone with her and her daughter CLoma Sousa    CT ANGIO CHEST  07/11/2018     The examination is diagnostic for evaluation of the pulmonary arteries.      No evidence of pulmonary embolism.       Several new pulmonary nodules are visualized within the right upper, right middle, and left lower lobe, with the largest nodule measuring up to 6 mm in the left lower lobe. These findings are nonspecific and can represent infectious/inflammatory    etiology, however given patient's history of pancreatic cancer metastases cannot be excluded. Improved bibasilar atelectasis and resolved pleural effusions.   Consider follow-up chest CT in 6-8 weeks to reassess this finding.      No growing thoracic lymphadenopathy.        Assessment:  73yo with non-specific pulmonary nodules in a never smoker with a history of pancreatic cancer as  well as significant basal cell carcinioma.  DDx includes granuloma, metastatic cancer (pancreatic more likely than others), or active infection/inflammation.  She did have a nodule on the right on her 03/2017 CT that has resolved, which occurred after her chemotherapy completed 2018, making this unlikely to have been a met.  All of her nodules are fairly small.  We discussed the radiologist suggestion of a short term follow up CT; she has a scan next week.  We discussed that if the abdomen is unremarkable and her nodules stable, then continued follow up could be considered.  However, if there are new  abdominal findings concerning for a local recurrence, then her nodules are more suspect and a biopsy may be warranted.  Her dyspnea may well be deconditioning.  Depending on her imaging next week, follow up in pulmonary with PFTs may be warranted    Recommendation:  1.  CT abd/chest as ordered next week.  2.  She was added to Thoracic Tumor Board for discussion  3.  PFTs ordered.      The plan was discussed with the patient and the patient/patient rep demonstrated understanding to the provider's satisfaction.    Consent was previously obtained from the patient to complete this telephone consult; including the potential for financial liability.    21+ minutes were spent on the phone with the patient, patient representatives, and/or other attendees.       Ayline Dingus, MD

## 2018-07-29 ENCOUNTER — Ambulatory Visit
Admission: RE | Admit: 2018-07-29 | Discharge: 2018-07-29 | Disposition: A | Discharge status: Home / Self Care | Payer: Medicare (Managed Care) | Source: Ambulatory Visit | Attending: Oncology | Admitting: Oncology

## 2018-07-29 DIAGNOSIS — J9811 Atelectasis: Secondary | ICD-10-CM

## 2018-07-29 DIAGNOSIS — C259 Malignant neoplasm of pancreas, unspecified: Secondary | ICD-10-CM | POA: Insufficient documentation

## 2018-07-29 DIAGNOSIS — J90 Pleural effusion, not elsewhere classified: Secondary | ICD-10-CM | POA: Insufficient documentation

## 2018-07-29 DIAGNOSIS — R918 Other nonspecific abnormal finding of lung field: Secondary | ICD-10-CM

## 2018-07-29 MED ORDER — IOHEXOL 350 MG/ML (OMNIPAQUE) IV SOLN *I*
1.0000 mL | Freq: Once | INTRAVENOUS | Status: AC
Start: 2018-07-29 — End: 2018-07-29
  Administered 2018-07-29: 100 mL via INTRAVENOUS

## 2018-07-29 MED ORDER — STERILE WATER FOR IRRIGATION IR SOLN *I*
900.0000 mL | Freq: Once | Status: DC
Start: 2018-07-29 — End: 2018-07-30

## 2018-07-30 ENCOUNTER — Encounter: Payer: Self-pay | Admitting: Oncology

## 2018-07-30 ENCOUNTER — Other Ambulatory Visit
Admission: RE | Admit: 2018-07-30 | Discharge: 2018-07-30 | Disposition: A | Discharge status: Home / Self Care | Payer: Medicare (Managed Care) | Source: Ambulatory Visit

## 2018-07-30 ENCOUNTER — Ambulatory Visit: Payer: Medicare (Managed Care) | Attending: Oncology | Admitting: Oncology

## 2018-07-30 VITALS — BP 119/57 | HR 79 | Temp 97.7°F | Resp 18 | Wt 155.6 lb

## 2018-07-30 DIAGNOSIS — R109 Unspecified abdominal pain: Secondary | ICD-10-CM

## 2018-07-30 DIAGNOSIS — Z01818 Encounter for other preprocedural examination: Secondary | ICD-10-CM | POA: Insufficient documentation

## 2018-07-30 DIAGNOSIS — C250 Malignant neoplasm of head of pancreas: Secondary | ICD-10-CM | POA: Insufficient documentation

## 2018-07-30 DIAGNOSIS — R0602 Shortness of breath: Secondary | ICD-10-CM

## 2018-07-30 DIAGNOSIS — C259 Malignant neoplasm of pancreas, unspecified: Secondary | ICD-10-CM

## 2018-07-30 LAB — CA 19 9 (EFF. 01-2011): CA 19 9 (eff. 01-2011): 45 U/mL — ABNORMAL HIGH (ref 0–35)

## 2018-07-30 LAB — COMPREHENSIVE METABOLIC PANEL
ALT: 48 U/L — ABNORMAL HIGH (ref 0–35)
AST: 48 U/L — ABNORMAL HIGH (ref 0–35)
Albumin: 4.2 g/dL (ref 3.5–5.2)
Alk Phos: 235 U/L — ABNORMAL HIGH (ref 35–105)
Anion Gap: 11 (ref 7–16)
Bilirubin,Total: 0.3 mg/dL (ref 0.0–1.2)
CO2: 28 mmol/L (ref 20–28)
Calcium: 9.1 mg/dL (ref 8.6–10.2)
Chloride: 99 mmol/L (ref 96–108)
Creatinine: 1.19 mg/dL — ABNORMAL HIGH (ref 0.51–0.95)
GFR,Black: 53 * — AB
GFR,Caucasian: 46 * — AB
Glucose: 90 mg/dL (ref 60–99)
Lab: 22 mg/dL — ABNORMAL HIGH (ref 6–20)
Potassium: 4.7 mmol/L (ref 3.3–5.1)
Sodium: 138 mmol/L (ref 133–145)
Total Protein: 7.5 g/dL (ref 6.3–7.7)

## 2018-07-30 LAB — CBC AND DIFFERENTIAL
Baso # K/uL: 0.1 10*3/uL (ref 0.0–0.1)
Basophil %: 1.6 %
Eos # K/uL: 0.2 10*3/uL (ref 0.0–0.4)
Eosinophil %: 4.3 %
Hematocrit: 41 % (ref 34–45)
Hemoglobin: 13.1 g/dL (ref 11.2–15.7)
IMM Granulocytes #: 0 10*3/uL (ref 0.0–0.0)
IMM Granulocytes: 0.2 %
Lymph # K/uL: 1.4 10*3/uL (ref 1.2–3.7)
Lymphocyte %: 27.5 %
MCH: 30 pg/cell (ref 26–32)
MCHC: 32 g/dL (ref 32–36)
MCV: 94 fL (ref 79–95)
Mono # K/uL: 0.4 10*3/uL (ref 0.2–0.9)
Monocyte %: 7.6 %
Neut # K/uL: 3 10*3/uL (ref 1.6–6.1)
Nucl RBC # K/uL: 0 10*3/uL (ref 0.0–0.0)
Nucl RBC %: 0 /100 WBC (ref 0.0–0.2)
Platelets: 262 10*3/uL (ref 160–370)
RBC: 4.4 MIL/uL (ref 3.9–5.2)
RDW: 13.4 % (ref 11.7–14.4)
Seg Neut %: 58.8 %
WBC: 5.1 10*3/uL (ref 4.0–10.0)

## 2018-07-30 LAB — PROTIME-INR
INR: 0.9 (ref 0.9–1.1)
Protime: 10.6 s (ref 10.0–12.9)

## 2018-07-30 LAB — NEUTROPHIL #-INSTRUMENT: Neutrophil #-Instrument: 3 10*3/uL

## 2018-07-30 NOTE — Progress Notes (Signed)
MEDICAL ONCOLOGY PROGRESS NOTE    Reason for Evaluation: Follow-up    Diagnosis: Pancreatic adenocarcinoma    Stage: Locally advanced    Diagnosis and Treatment History:  1. Over several months, patient noted worsening abdominal pain and weight loss.  Extensive work-up including EGD, colonoscopy and imaging were unrevealing.  After suggestive HIDA scan, she underwent cholecystectomy on September 06, 2016 but symptoms persisted.      2. MRCP on September 25, 2016 demonstrated 2x2cm soft tissue mass-like abnormality in celiac region, contiguous with medial margin of uncinate process of pancreas concerning for pancreatic lession vs lymphadenopathy.    3. EUS/FNA on September 27, 2016 demonstrated a mass in pancreas uncinate process and lymph node pressing celiac axis. FNA confirmed adenocarcinoma.    4. She was transferred to [facility] for additional management.  Abd/pelvis CT on September 30, 2016 revealed ill-defined prominent retroperitoneal soft tissue with central low attenuation suggestive of necrosis which abuts SMA and is adjacent to pancreatic uncinate process similar to prior MRI. Differential includes pancreatic neoplasm versus lymphadenopathy.  CT angio on September 29, 2016 showed no pulmonary embolism.  Nonspecific 3 mm nodules in right upper and middle lobe, recommend attention on follow-up imaging.      5. Diagnostic laparoscopy and port placement by Dr. Ron Agee on October 04, 2016 - no evidence of metastatic disease.    6.Neoadjuvant SBRT 25 Gy in 5 fractions pancreatic cancer, with concurrent boost to 30 Gy in 5 fractions to tumor around SMA from 02/13/17-03/08/17    7. She underwent a Whipple procedure with Dr. Delano Metz, MD on 03/29/17.  Her postoperative course was complicated by intra-abdominal abscess, abdominal pain, gastrostomy tube induced gastric anastomotic dehiscence , and  prolonged hospitalization (with multiple readmissions)    Treatment Hx: neoadjuvant FOLFIRINOX - 8 cycles completed 8/32018 - 01/11/2017, followed by  neoadjuvant SBRT 25 Gy in 5 fractions pancreatic cancer, with concurrent boost to 30 Gy in 5 fractions to tumor around SMA from 02/13/17-03/08/17    Interval History:   Amanda Galloway is having progressive fatigue and stable SOB. She had pulmonary nodules noted on last CT scan - saw pulmonology 5/22 and recommended further follow up on short interval scans.     Today SOB is stable, no cough.   No fevers or chills   She has band-like abdominal pain and tightness across upper abdomen. No radiating pain, no back pain. Pain is worse after eating and endorses early satiety   Lightedheaded with quick position changes. No headaches   BMs are normal. Usually goes 3-4 times a day, soft and formed. No blood in stool.  Since the surgery has had lighter colored stools with green streaks. No oily or greasy stools.   Ran out of lyrica over the weekend and had some withdrawal symptoms, has meds now and is starting to feel better.    Pain to right shoulder secondary to rotator cuff tear and tendon tear. Scheduled for surgery 6/15    Past Medical/Surgical History:  1. Fibromyalgia  2. Hypothyroidism  3. S/p knee surgery  4. S/p hysterectomy  5. S/p shoulder surgery  6. S/p tonsillectomy    Current Medications:  Current Outpatient Medications on File Prior to Visit   Medication Sig Dispense Refill    oxyCODONE-acetaminophen (PERCOCET) 7.5-325 MG per tablet Take 1 tablet by mouth 4 times daily as needed for Pain Max daily dose: 4 tablets 120 tablet 0    traZODone (DESYREL) 50 MG tablet TAKE 1 TABLET BY MOUTH NIGHTLY 90  tablet 1    triamterene-hydrochlorothiazide (DYAZIDE) 37.5-25 MG per capsule TAKE 1 CAPSULE BY MOUTH EVERY MORNING 30 capsule 5    pregabalin (LYRICA) 150 MG capsule Take 1 capsule (150 mg total) by mouth 2 times daily Max daily dose: 300 mg 60 capsule 2    pantoprazole (PROTONIX) 40 MG EC tablet Take 1 tablet (40 mg total) by mouth daily Swallow whole. Do not crush, break, or chew. 90 tablet 6    cyclobenzaprine  (FLEXERIL) 10 MG tablet Take 1 tablet (10 mg total) by mouth nightly as needed for Muscle spasms 30 tablet 2    pancrelipase (CREON 24) 24000-76000 units DR capsule Take 1-3 capsules prior to meals and 1-2 prior to snacks 180 capsule 6    DULoxetine (CYMBALTA) 20 MG DR capsule Take 2 capsules (40 mg total) by mouth every evening 180 capsule 6    ferrous sulfate 325 (65 FE) MG tablet Take 1 tablet (325 mg total) by mouth daily (with breakfast) 100 tablet 3    levothyroxine (SYNTHROID, LEVOTHROID) 150 MCG tablet Take 1 tablet (150 mcg total) by mouth daily (before breakfast) 90 tablet 6    PREMARIN 0.625 MG tablet Take 1 tablet (0.625 mg total) by mouth every morning 90 tablet 3    Red Yeast Rice Extract (RED YEAST RICE PO) Take by mouth      docusate sodium (COLACE) 100 MG capsule Take 2 capsules (200 mg total) by mouth 2 times daily 120 capsule 5    ondansetron (ZOFRAN-ODT) 4 MG disintegrating tablet Take 1 tablet (4 mg total) by mouth 3 times daily as needed (nausea)   Place on top of tongue. 30 tablet 0    SUMAtriptan (IMITREX) 25 MG tablet Take 1 tablet (25 mg total) by mouth as needed for Migraine   Take at onset of headache. May repeat once in 2 hours. 9 tablet 1    senna (SENOKOT) 8.6 MG tablet Take 2 tablets by mouth 2 times daily 120 tablet 5    Vitamins - senior (CENTRUM SILVER) TABS Take 1 tablet by mouth daily (Patient taking differently: Take 1 tablet by mouth every morning   ) 30 tablet 0     No current facility-administered medications on file prior to visit.      Social History: Divorced.  Worked in Act[employer].  Daughter Amanda Galloway) worked on [employer] and now in infusion    Family History: Mother with breast cancer.  Father with cancer of unknown type.    Review of Systems:   Review of Systems   Constitutional: Positive for appetite change and fatigue. Negative for chills, fever and unexpected weight change.   Eyes: Negative for icterus.   Respiratory: Positive for shortness of  breath. Negative for cough.    Gastrointestinal: Positive for abdominal pain. Negative for abdominal distention, blood in stool, constipation, diarrhea, nausea and vomiting.   Genitourinary: Negative for dysuria and hematuria.    Musculoskeletal: Positive for arthralgias (right shoulder - rotator cuff tear). Negative for back pain.   Skin: Negative for rash.   Neurological: Positive for light-headedness. Negative for headaches and numbness.     Physical Exam:  Vitals:    07/30/18 1025   BP: 119/57   Pulse: 79   Resp: 18   Temp: 36.5 C (97.7 F)   Weight: 70.6 kg (155 lb 10.3 oz)     GENERAL: Well-appearing and well-nourished, in no acute distress. Alert and oriented. Anxious  SKIN: Warm and dry, without jaundice, ecchymoses,  or petechiae  HEENT: Normocephalic. Oral mucosa not evaluated due to COVID precautions. Sclerae anicteric, extra-ocular movements intact.  LUNGS: Normal effort, clear to auscultation bilaterally without crackles, wheezes, or rales.  CARDIAC: Regular rate and rhythm, without murmurs, rubs or gallops.  ABDOMINAL: Normoactive bowel sounds. Abdomen is soft and nondistended. Tenderness with deep palpation to RUQ. Palpable liver edge  EXTREMITIES: 1+ edema to BLE with erythema consistent with vascular changes - stable.  NEUROLOGIC: Grossly intact with no focal weakness or loss of sensation. Gait and station normal.  PAIN: 0  Performance Status: ECOG PS 1    Assessment and Plan:   Amanda Galloway 73 y.o. female with h/o locally advanced pancreatic adenocarcinoma that was treated with 8 cycles neoadjuvant FOLFIRINOX (with Neulasta support).Then underwent neoadjuvant SBRT from 02/13/17-03/08/17 and Whipple procedure with Dr. Delano Metz, MD on 03/29/17. Complicated post-op course with extended hospitalization and multiple readmissions (Jan-July 2019) secondary to abscesses, gastric outlet obstruction, pain, malnutrition, and wound dehiscence.    The final pathology of the Whipple with excellent treatment  response - small focus (0.2 cm) of high-grade pancreatic intraepithelial neoplasia. There was no lymph node involvement or positive margins. No adjuvant chemo d/t multiple hospitalizations post-op    Today, she presents to clinic for scan review accompanied by her daughter Amanda Galloway     Ongoing fatigue and abdominal pain   Imaging: CT scans May 2020 with new lesion in hepatic segment 3 concerning for recurrent disease or evolving abscess. Lungs with stable pulmonary nodules   Patient with no fevers, chills, or other symptoms consistent with hepatic abscess     Plan:    IR liver biopsy ASAP to evaluate new hepatic lesion   Check CA 19, CBC, CMP, PT/INR today   RTC 1 week after biopsy to discuss next steps. If positive for recurrence would plan to treat with mFOLFIRINOX based on excellent pathological response to neoadjuvant chemo    Arna Snipe, NP    I have seen an examined the patient and agree with the above outlined findings and plans as discussed with the APP and the patient.    Amanda Galloway

## 2018-07-31 ENCOUNTER — Telehealth: Payer: Self-pay | Admitting: Pulmonology

## 2018-07-31 NOTE — Telephone Encounter (Signed)
Called Amanda Galloway.  She was presented today at thoracic tumor board.  The nodule at the left base was thought suspicious but uncertain for metastatic lesion.  If the biopsy of the newly discovered liver lesion does not demonstrate metastatic disease/recurrence then a wedge biopsy could be considered for the lung nodule to make a definitive diagnosis.  If the hepatic lesion is pancreatic cancer and the lung nodule can be followed as she gets systemic therapy.  All of her questions were addressed.

## 2018-08-04 ENCOUNTER — Telehealth: Payer: Self-pay

## 2018-08-04 DIAGNOSIS — Z01812 Encounter for preprocedural laboratory examination: Secondary | ICD-10-CM

## 2018-08-04 NOTE — Telephone Encounter (Signed)
Precall for Biopsy.  Protocol details state 3rd hepatic segment (liver biopsy).  Pt will arrive at 1000, has driver.  Will stop eating after 0500.  Will take last dose of 81 ASA on 6/2.  Will take am meds with sips of H2O.  Will get COVID test at [facility] on 6/2 or 6/3.  Orders placed.  Pt aware that driver cannot wait in IR.  Aware that she will need 2 hrs recovery after so d/c after 1400. She will take green elevators to ground floor for check in.

## 2018-08-05 ENCOUNTER — Encounter: Payer: Self-pay | Admitting: Pain Medicine

## 2018-08-05 ENCOUNTER — Ambulatory Visit: Payer: Medicare (Managed Care) | Admitting: Pain Medicine

## 2018-08-05 DIAGNOSIS — G8929 Other chronic pain: Secondary | ICD-10-CM

## 2018-08-05 DIAGNOSIS — M25511 Pain in right shoulder: Secondary | ICD-10-CM

## 2018-08-05 MED ORDER — OXYCODONE-ACETAMINOPHEN 7.5-325 MG PO TABS *A*
1.0000 | ORAL_TABLET | Freq: Four times a day (QID) | ORAL | 0 refills | Status: DC | PRN
Start: 2018-08-05 — End: 2018-09-11

## 2018-08-05 NOTE — Progress Notes (Signed)
Telephone Visit     This is an established patient visit.    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       HPI:  Currently patient rates her neck, shoulder and arm pain at 5/10 with pain intensity range from 5-10/10 over the past week. Patient notes that any movement of raising her arm or having her arm extended 'pulls at the shoulder and makes the neck    Patient reports that right shoulder bicep surgery is rescheduled for same day surgery on 08/18/18.  Patient also reports that following the 07/10/18 fall she was having continued breathlessness and had a chest CT with 3 spots found on the lung; subsequent CT of abdomen showed a spot on the liver.  Patient is scheduled for biopsy of liver (08/08/18) and then is scheduled for follow-up with oncologist 08/15/18.     Current medications prescribed by Pain Service:  Oxycodone/ acetaminophen 7.5/325 every 6 hours as needed.    Pain medications prescribed by primary provider:  pregabalin  Duloxetine  Cyclobenzaprine  Trazodone    Appetite is adequate.  Sleep is fitful.  Patient continues to live with her adult daughter (moved from [place] last year so few supports in area; had to retire from from      Patient's problem list, allergies, and medications were reviewed and updated as appropriate. Please see the EHR for full details.    Physical Exam:    This visit was performed during a pandemic event, thus the physical exam was not performed.    Assessment Plan:  Consent was previously obtained from the patient to complete this telephone consult; including the potential for financial liability.    Patient continues to note that use of percocet helps to control shoulder pain intensity somewhat and denies any  significant side effects related to use. Patient is responsible with medication.  Will continue with percocet current dosing. [facility] PMP registry reviewed 08/05/18, Reference # [id number].    Patient stressed with recent findings on CT of lung and abdomen with possible reoccurrence of cancer (pancreatic CA 2019; history of chemo, radiation and Whipple procedure). Liver biopsy scheduled for 08/08/18 and follow-up with oncologist 08/15/18.      Patient uncertain whether to continue with 08/18/18 shoulder surgery (states daughter is insistent she should do this given pain) however patient more worried about cancer returning.  Encouraged patient to speak with oncologist regarding options. Suggested she write out questions and review with oncologist and ortho providers regarding best course.  Also suggested patient speak with oncology regarding connecting with social work and support group given stressors and limited supports in region.  Patient in agreement that this would be helpful.      Will schedule patient for follow-up in office in 4 weeks.       The plan was discussed with the patient who demonstrated understanding to the provider's satisfaction.    21+ minutes were spent on the phone with the patient.     Anne Fu, NP

## 2018-08-05 NOTE — Patient Instructions (Signed)
Consent was previously obtained from the patient to complete this telephone consult; including the potential for financial liability.    Patient continues to note that use of percocet helps to control shoulder pain intensity somewhat and denies any significant side effects related to use. Patient is responsible with medication.  Will continue with percocet current dosing. [facility] PMP registry reviewed 08/05/18, Reference # [id number].    Patient stressed with recent findings on CT of lung and abdomen with possible reoccurrence of cancer (pancreatic CA 2019; history of chemo, radiation and Whipple procedure). Liver biopsy scheduled for 08/08/18 and follow-up with oncologist 08/15/18.      Patient uncertain whether to continue with 08/18/18 shoulder surgery (states daughter is insistent she should do this given pain) however patient more worried about cancer returning.  Encouraged patient to speak with oncologist regarding options. Suggested she write out questions and review with oncologist and ortho providers regarding best course.  Also suggested patient speak with oncology regarding connecting with social work and support group given stressors and limited supports in region.  Patient in agreement that this would be helpful.      Will schedule patient for follow-up in office in 4 weeks.       The plan was discussed with the patient who demonstrated understanding to the provider's satisfaction.    21+ minutes were spent on the phone with the patient.

## 2018-08-06 ENCOUNTER — Ambulatory Visit
Admission: AD | Admit: 2018-08-06 | Discharge: 2018-08-06 | Disposition: A | Discharge status: Home / Self Care | Payer: Medicare (Managed Care) | Source: Ambulatory Visit | Attending: Radiology | Admitting: Radiology

## 2018-08-06 DIAGNOSIS — Z01812 Encounter for preprocedural laboratory examination: Secondary | ICD-10-CM

## 2018-08-06 DIAGNOSIS — Z1159 Encounter for screening for other viral diseases: Secondary | ICD-10-CM | POA: Insufficient documentation

## 2018-08-06 DIAGNOSIS — Z20828 Contact with and (suspected) exposure to other viral communicable diseases: Secondary | ICD-10-CM

## 2018-08-06 LAB — COVID-19 PCR: COVID-19 PCR: 0

## 2018-08-06 NOTE — ED Triage Notes (Signed)
Patient presenting to Urgent Care for testing only. Outside provider ordered and referred patient for COVID-19 test. NP swab obtained and sent for analysis.         Triage Note   Sigmund Morera A Kaimana Neuzil, RN

## 2018-08-08 ENCOUNTER — Other Ambulatory Visit: Payer: Self-pay | Admitting: Adult Health

## 2018-08-08 ENCOUNTER — Ambulatory Visit
Admission: RE | Admit: 2018-08-08 | Discharge: 2018-08-08 | Disposition: A | Discharge status: Home / Self Care | Payer: Medicare (Managed Care) | Source: Ambulatory Visit | Attending: Radiology | Admitting: Radiology

## 2018-08-08 DIAGNOSIS — K760 Fatty (change of) liver, not elsewhere classified: Secondary | ICD-10-CM | POA: Insufficient documentation

## 2018-08-08 DIAGNOSIS — C250 Malignant neoplasm of head of pancreas: Secondary | ICD-10-CM

## 2018-08-08 DIAGNOSIS — Z8507 Personal history of malignant neoplasm of pancreas: Secondary | ICD-10-CM | POA: Insufficient documentation

## 2018-08-08 MED ORDER — LIDOCAINE HCL 1 % IJ SOLN *I*
INTRAMUSCULAR | Status: AC
Start: 2018-08-08 — End: 2018-08-08
  Filled 2018-08-08: qty 20

## 2018-08-08 MED ORDER — MIDAZOLAM HCL 1 MG/ML IJ SOLN *I* WRAPPED
INTRAMUSCULAR | Status: AC | PRN
Start: 2018-08-08 — End: 2018-08-08
  Administered 2018-08-08: .5 mg via INTRAVENOUS

## 2018-08-08 MED ORDER — MIDAZOLAM HCL 1 MG/ML IJ SOLN *I* WRAPPED
INTRAMUSCULAR | Status: AC
Start: 2018-08-08 — End: 2018-08-08
  Filled 2018-08-08: qty 2

## 2018-08-08 MED ORDER — FENTANYL CITRATE 50 MCG/ML IJ SOLN *WRAPPED*
INTRAMUSCULAR | Status: AC
Start: 2018-08-08 — End: 2018-08-08
  Filled 2018-08-08: qty 2

## 2018-08-08 MED ORDER — FENTANYL CITRATE 50 MCG/ML IJ SOLN *WRAPPED*
INTRAMUSCULAR | Status: AC | PRN
Start: 2018-08-08 — End: 2018-08-08
  Administered 2018-08-08: 25 ug via INTRAVENOUS

## 2018-08-08 NOTE — Progress Notes (Signed)
[employer] Nursing Procedure Note    Amanda Galloway  [id number]    Procedure: Liver Biopsy        Status: Completed    Patient tolerated procedure well     Specimen Collection: yes  Sponge count: N/A  Fluid Removed:N/A  Procedure Dressing Site located:Abdomen  Dressing Type:sterile gauze/tegaderm  Tube labeled:N/A  Biopatch:N/A  Dressing status:Clean, dry and intact   Hematoma:Not evident  Medication received:Versed 0.5mg  and Fentanyl 39mcg  Cardiovascular:   Peripheral Pulses: N/A    Fistula: N/A  Neuro Assessment:N/A  Implant patient information given to patient or parent/guardian:N/A    Report given NB:[facility] recovery nurse. Name: Benjamine Mola

## 2018-08-08 NOTE — Discharge Instructions (Signed)
Interventional Radiology Liver Biopsy Discharge Instructions    08/08/2018             12:44 PM     Provider performing test/procedure:Dr. Tilden Dome    Patient given Medication: Yes. You have been given medicine, Versed and Fentanyl, that may make you sleepy. Do not drive, operate heavy machinery, drink alcoholic beverages, make important personal or business decisions, or sign legal documents until the next day.     No heavy lifting, strenuous activity or exercising for the next 48 hours.   Rest for the remainder of the day. You can resume your usual activities in 48 hours.    You can eat your usual diet    You may shower as desired. Do not take a tub bath for the next 24 hours.   You may remove the bandage from the biopsy site tomorrow morning. If bleeding occurs at the site, apply firm pressure to the site for 10 minutes. If the bleeding continues, apply pressure and call interventional radiology or your doctor immediately.   Resume your usual medications. If you are taking warfarin (Coumadin) do not resume for 24 hours.   Do not take aspirin (including Anacin), ibuprofen (Advil, Motrin, Nuprin) or naproxen sodium (Aleve) for the next 24 to 48 hours, unless directed by your physician.   Acetaminophen (Tylenol) is the drug of choice if you have pain.    Call Your Doctor IF:      You have signs of infection at the biopsy site: fever, chills, severe pain or swelling, foul smelling drainage   If you have any abdominal pain, other than at the incision site.    Please call [facility] Interventional Radiology for questions or concerns regarding your procedure.  Monday-Friday from 7am- 6pm call [phone].  After hours and weekends you may call [phone] and speak to the Resident on-call. The Emergency Department is open 24 hours a day if emergency treatment is required.

## 2018-08-08 NOTE — Procedures (Signed)
Procedure Report           Time out documentation completed prior to procedure:  Yes    Indications/Pre-Procedure diagnosis:  Liver lesion with hx of pancreatic cancer    Procedure performed: IR percutaneous liver biopsy    Guide Wire Removed: N/A    Findings/Procedure Summary (detailed report located in the Image tab): Uncomplicated left hepatic lobe lesion biopsy. Refer to full report for details.    Complications:  No immediate complication    Condition:  good    EBL: 2 cc    Specimens:  yes    Operators: Dr. Tilden Dome and Dr. Cordelia Pen    Disposition:  Discharge home    Post-Procedure Diagnosis: Unchanged    Cordelia Pen, MD  08/08/2018  11:41 AM

## 2018-08-08 NOTE — Preop H&P (Addendum)
OUTPATIENT  Chief Complaint: Liver lesion w/ hx of pancreatic cancer    History of Present Illness:  73 y/o female patient with PMHx of s/p Wipple for pancreatic cancer presented with new liver lesion suspicious for metastatic disease. Patient is here to undergo liver lesion image guided biopsy. Patient denies any abdominal pain, bowel movement issues, shortness of breath, fatigue or weight loss. Rest of ROS is unremarkable.      Past Medical History:   Diagnosis Date    Arthritis     Basal cell carcinoma of skin     Left shin, deep.  Required skin grafting, 5FU cream.    Depression     Fibromyalgia     Hypothyroidism     Pancreatic cancer      Past Surgical History:   Procedure Laterality Date    APPENDECTOMY      CERVICAL FUSION      CHOLECYSTECTOMY, LAPAROSCOPIC  09/06/2016    HYSTERECTOMY  08/1986    Fibroids    KNEE SURGERY Right     PR INSERT TUNNELED CV CATH WITH PORT Right 10/04/2016    Procedure: Right IJ MEDIPORT Insertion;  Surgeon: Delano Metz, MD;  Location: [facility] MAIN OR;  Service: Oncology General    PR LAP,DIAGNOSTIC ABDOMEN N/A 10/04/2016    Procedure: LAPAROSCOPY DIAGNOSTIC;  Surgeon: Delano Metz, MD;  Location: [facility] MAIN OR;  Service: Oncology General    PR PART [place] PANC,PROX+REMV DUOD+ANAST N/A 03/29/2017    Procedure: WHIPPLE PROCEDURE;  Surgeon: Delano Metz, MD;  Location: [facility] MAIN OR;  Service: Oncology General    ROTATOR CUFF REPAIR Right     TONSILLECTOMY AND ADENOIDECTOMY      whipple       Family History   Problem Relation Age of Onset    Breast cancer Mother         died 41    Arthritis Mother     Cancer Father         NHL, died 52    Heart Disease Father     Diabetes Father     High Blood Pressure Father     Diabetes Sister     Obesity Sister         4 siblings    No Known Problems Daughter     High Blood Pressure Brother      Social History     Socioeconomic History    Marital status: Divorced     Spouse name: Not on file    Number of children: Not on file    Years  of education: Not on file    Highest education level: Not on file   Tobacco Use    Smoking status: Never Smoker    Smokeless tobacco: Never Used   Substance and Sexual Activity    Alcohol use: No    Drug use: No    Sexual activity: Never     Partners: Male   Other Topics Concern    Not on file   Social History Narrative    Lives with daughter in [place]    Previously Glass blower/designer for [employer], now retired    Ex-husband in [place]: No Known Allergies (drug, envir, food or latex)    Current Outpatient Medications   Medication    oxyCODONE-acetaminophen (PERCOCET) 7.5-325 MG per tablet    traZODone (DESYREL) 50 MG tablet    triamterene-hydrochlorothiazide (DYAZIDE) 37.5-25 MG per capsule    pregabalin (LYRICA)  150 MG capsule    pantoprazole (PROTONIX) 40 MG EC tablet    pancrelipase (CREON 24) 24000-76000 units DR capsule    DULoxetine (CYMBALTA) 20 MG DR capsule    ferrous sulfate 325 (65 FE) MG tablet    levothyroxine (SYNTHROID, LEVOTHROID) 150 MCG tablet    PREMARIN 0.625 MG tablet    Red Yeast Rice Extract (RED YEAST RICE PO)    ondansetron (ZOFRAN-ODT) 4 MG disintegrating tablet    senna (SENOKOT) 8.6 MG tablet    Vitamins - senior (CENTRUM SILVER) TABS    cyclobenzaprine (FLEXERIL) 10 MG tablet    docusate sodium (COLACE) 100 MG capsule    SUMAtriptan (IMITREX) 25 MG tablet     No current facility-administered medications for this encounter.         Review of Systems:   Review of Systems   Constitutional: Negative for chills, fever, malaise/fatigue and weight loss.   HENT: Negative for congestion, hearing loss and sore throat.    Eyes: Negative for blurred vision and photophobia.   Respiratory: Negative for cough and shortness of breath.    Cardiovascular: Negative for chest pain, palpitations, claudication and leg swelling.   Gastrointestinal: Negative for diarrhea and heartburn.   Genitourinary: Negative for dysuria and frequency.   Musculoskeletal:  Negative for neck pain.   Skin: Negative for rash.   Neurological: Negative.    Endo/Heme/Allergies: Negative.    Psychiatric/Behavioral: Negative.        Last Nursing documented pain:  0-10 Scale: 5 (08/08/18 1046)      Patient Vitals for the past 24 hrs:   BP Temp Temp src Pulse Resp SpO2 Height Weight   08/08/18 1036 112/64 36.8 C (98.2 F) TEMPORAL 57 16 99 % 167.6 cm (5\' 6" ) 69.4 kg (153 lb)            Physical Exam   Constitutional: She is oriented to person, place, and time. She appears well-developed and well-nourished.   HENT:   Head: Normocephalic and atraumatic.   Eyes: Pupils are equal, round, and reactive to light. Conjunctivae and EOM are normal.   Neck: Normal range of motion. Neck supple.   Cardiovascular: Normal rate, regular rhythm, normal heart sounds and intact distal pulses.   Pulmonary/Chest: Effort normal and breath sounds normal.   Abdominal: Soft. Bowel sounds are normal.   Musculoskeletal: Normal range of motion.   Neurological: She is alert and oriented to person, place, and time.   Skin: Skin is warm and dry.   Psychiatric: She has a normal mood and affect. Her behavior is normal.       Lab Results:   All labs in the last 72 hours:  Recent Results (from the past 72 hour(s))   COVID-19 PCR    Collection Time: 08/06/18  2:45 PM   Result Value Ref Range    COVID-19 PCR .        Radiology impressions (last 3 days):  No results found.    Currently Active/Followed Hospital Problems:  There are no active hospital problems to display for this patient.      Assessment: 73 y/o female patient with PMHx of s/p Wipple for pancreatic cancer presented with new liver lesion suspicious for metastatic disease. Patient is here to undergo liver lesion image guided biopsy.    Plan: US-guided liver lesion biopsy.    Author: Cordelia Pen, MD  Note created: 08/08/2018  at: 11:11 AM

## 2018-08-08 NOTE — Invasive Procedure Plan of Care (Signed)
[place]  OR SURGICAL PROCEDURE                            Patient Name: Amanda Galloway  [facility] [address]                                                              DOB: June 30, 1945         Please read this form or have someone read it to you.   It's important to understand all parts of this form. If something isn't clear, ask [name] to explain.   When you sign it, that means you understand the form and give [name] permission to do this surgery or procedure.     I agree for Lora Paula, MD along with any assistants* they may choose, to treat the following condition(s): Abnormal tissue needing sampling   By doing this surgery or procedure on me: Introduction of a needle under image guidance in to abnormal tissue and obtaining tissue samples    Due to COVID-19, [facility] is Temporarily Suspending Signature for Informed Consent. Patient verbalizes agreement of consent to proceed.    Designee (Print/sign) :_________________________________________________      Date/Time:     _______________________________________________________   This is also known as: Imaging guided biopsy of liver   Laterality:     *if you'd like a list of the assistants, please ask. We can give that to you.    1. The care provider has explained my condition to me. They have told me how the procedure can help me. They have told me about other ways of treating my condition. I understand the care provider cannot guarantee the result of the procedure. If I don't have this procedure, my other choices are: Not to perform the procedure    2. The care provider has told me the risks (problems that can happen) of the procedure. I understand there may be unwanted results. The risks that are related to this procedure include: Bleeding, infection, damage to surrounding organs and structures, allergic reaction to contrast agent, collapse of lung, and in very rare circumstances, death.    3.  I understand that during the procedure, my care provider may find a condition that we didn't know about before the treatment started. Therefore, I agree that my care provider can perform any other treatment which they think is necessary and available.    4. I understand the care provider may remove tissue, body parts, or materials during this procedure. These materials may be used to help with my diagnosis and treatment. They might also be used for teaching purposes or for research studies that I have separately agreed to participate in. Otherwise they will be disposed of as required by law.    5. My care provider might want a representative from a [place] to be there during my procedure. I understand that person works for:          The ways they might help my care provider during my procedure include:            6. Here are my decisions about receiving blood, blood products, or tissues.  I understand my decisions cover the time before, during and after my procedure, my treatment, and my time in the hospital. After my procedure, if my condition changes a lot, my care provider will talk with me again about receiving blood or blood products. At that time, my care provider might need me to review and sign another consent form, about getting or refusing blood.    I understand that the blood is from the community blood supply. Volunteers donated the blood, the volunteers were screened for health problems. The blood was examined with very sensitive and accurate tests to look for hepatitis, HIV/AIDS, and other diseases. Before I receive blood, it is tested again to make sure it is the correct type.    My chances of getting a sickness from blood products are small. But no transfusion is 100% safe. I understand that my care provider feels the good I will receive from the blood is greater than the chances of something going wrong. My care provider has answered my questions about blood products.      My  decision  about blood or  blood products           My decision   about tissue  Implants              I understand this  form.    My care provider  or his/her  assistants have  explained:   What I am having done and why I need it.  What other choices I can make instead of having this done.  The benefits and possible risks (problems) to me of having this done.  The benefits and possible risks (problems) to me of receiving transplants, blood, or blood products.  There is no guarantee of the results.  The care provider may not stay with me the entire time that I am in the operating or procedure room.  My provider has explained how this may affect my procedure. My provider has answered my  questions about this.         I give my  permission for  this surgery or  procedure.            _______________________________________________                                     My signature  (or parent or other person authorized to sign for you, if you are unable to sign for  yourself or if you are under 63 years old)        ______           Date        _____        Time   Electronic Signatures will display at the bottom of the consent form.    Care provider's statement: I have discussed the planned procedure, including the possibility for transfusion of blood  products or receipt of tissue as necessary; expected benefits; the possible complications and risks; and possible alternatives  and their benefits and risks with the patients or the patient's surrogate. In my opinion, the patient or the patient's surrogate  understands the proposed procedure, its risks, benefits and alternatives.              Electronically signed by: Cordelia Pen, MD  08/08/2018         Date        11:04 AM        Time

## 2018-08-08 NOTE — Invasive Procedure Plan of Care (Signed)
[place]  OR SURGICAL PROCEDURE                            Patient Name: Amanda Galloway  [facility] [address] MR                                                              DOB: 1945/07/10         Please read this form or have someone read it to you.   It's important to understand all parts of this form. If something isn't clear, ask [name] to explain.   When you sign it, that means you understand the form and give [name] permission to do this surgery or procedure.     I agree for Lora Paula, MD along with any assistants* they may choose, to treat the following condition(s): Need for moderate sedation during procedure/minor surgery.   By doing this surgery or procedure on me: Establishment of moderate sedation.    Due to COVID-19, [facility] is Engineer, technical sales for Informed Consent. Patient verbalizes agreement of consent to proceed.    Designee (Print/sign) :_________________________________________________      Date/Time:     _______________________________________________________   This is also known as:    Laterality: Not applicable     *if you'd like a list of the assistants, please ask. We can give that to you.    1. The care provider has explained my condition to me. They have told me how the procedure can help me. They have told me about other ways of treating my condition. I understand the care provider cannot guarantee the result of the procedure. If I don't have this procedure, my other choices are: Not undergoing procedure, undergoing procedure without sedation.    2. The care provider has told me the risks (problems that can happen) of the procedure. I understand there may be unwanted results. The risks that are related to this procedure include: Excessive sedation, which may require special procedures to keep you safe, including placement of a breathing tube.  Nausea, vomiting, problems with heart rate or blood pressure,  breathing difficilties, very rarely death. Occasionally, vivid dreams or agitation requiring additional medication.    3. I understand that during the procedure, my care provider may find a condition that we didn't know about before the treatment started. Therefore, I agree that my care provider can perform any other treatment which they think is necessary and available.    4. I understand the care provider may remove tissue, body parts, or materials during this procedure. These materials may be used to help with my diagnosis and treatment. They might also be used for teaching purposes or for research studies that I have separately agreed to participate in. Otherwise they will be disposed of as required by law.    5. My care provider might want a representative from a [place] to be there during my procedure. I understand that person works for:          The ways they might help my care provider during my procedure include:            6. Here  are my decisions about receiving blood, blood products, or tissues. I understand my decisions cover the time before, during and after my procedure, my treatment, and my time in the hospital. After my procedure, if my condition changes a lot, my care provider will talk with me again about receiving blood or blood products. At that time, my care provider might need me to review and sign another consent form, about getting or refusing blood.    I understand that the blood is from the community blood supply. Volunteers donated the blood, the volunteers were screened for health problems. The blood was examined with very sensitive and accurate tests to look for hepatitis, HIV/AIDS, and other diseases. Before I receive blood, it is tested again to make sure it is the correct type.    My chances of getting a sickness from blood products are small. But no transfusion is 100% safe. I understand that my care provider feels the good I will receive from the blood is greater  than the chances of something going wrong. My care provider has answered my questions about blood products.      My decision  about blood or  blood products           My decision   about tissue  Implants              I understand this  form.    My care provider  or his/her  assistants have  explained:   What I am having done and why I need it.  What other choices I can make instead of having this done.  The benefits and possible risks (problems) to me of having this done.  The benefits and possible risks (problems) to me of receiving transplants, blood, or blood products.  There is no guarantee of the results.  The care provider may not stay with me the entire time that I am in the operating or procedure room.  My provider has explained how this may affect my procedure. My provider has answered my  questions about this.         I give my  permission for  this surgery or  procedure.            _______________________________________________                                     My signature  (or parent or other person authorized to sign for you, if you are unable to sign for  yourself or if you are under 17 years old)        ______           Date        _____        Time   Electronic Signatures will display at the bottom of the consent form.    Care provider's statement: I have discussed the planned procedure, including the possibility for transfusion of blood  products or receipt of tissue as necessary; expected benefits; the possible complications and risks; and possible alternatives  and their benefits and risks with the patients or the patient's surrogate. In my opinion, the patient or the patient's surrogate  understands the proposed procedure, its risks, benefits and alternatives.              Electronically signed by: Cordelia Pen, MD  08/08/2018         Date        11:07 AM        Time

## 2018-08-12 LAB — SURGICAL PATHOLOGY

## 2018-08-14 ENCOUNTER — Telehealth: Payer: Self-pay | Admitting: Oncology

## 2018-08-15 ENCOUNTER — Encounter: Payer: Self-pay | Admitting: Oncology

## 2018-08-15 ENCOUNTER — Ambulatory Visit: Payer: Medicare (Managed Care) | Attending: Oncology | Admitting: Oncology

## 2018-08-15 VITALS — BP 110/60 | HR 67 | Temp 98.1°F | Resp 17 | Wt 155.4 lb

## 2018-08-15 DIAGNOSIS — C250 Malignant neoplasm of head of pancreas: Secondary | ICD-10-CM | POA: Insufficient documentation

## 2018-08-15 NOTE — Progress Notes (Signed)
Patient and patient visitor complied with masking policy throughout duration of appointment. Writer maintained use of mask and faceshield/eyewear throughout duration of appointment. Writer was within 6 feet of patient for >5 minutes due to the nature of the visit.

## 2018-08-15 NOTE — Progress Notes (Signed)
MEDICAL ONCOLOGY PROGRESS NOTE    Reason for Evaluation: Follow-up    Diagnosis: Pancreatic adenocarcinoma    Stage: Locally advanced    Diagnosis and Treatment History:  Over several months, patient noted worsening abdominal pain and weight loss.  MRCP on September 25, 2016 demonstrated 2x2cm soft tissue mass-like abnormality in celiac region, contiguous with medial margin of uncinate process of pancreas concerning for pancreatic lession vs lymphadenopathy.    EUS/FNA on September 27, 2016 demonstrated a mass in pancreas uncinate process and lymph node pressing celiac axis. FNA confirmed adenocarcinoma.    Neoadjuvant FOLFIRINOX - 8 cycles completed 8/32018 - 01/11/2017,     Neoadjuvant SBRT 25 Gy in 5 fractions pancreatic cancer, with concurrent boost to 30 Gy in 5 fractions to tumor around SMA from 02/13/17-03/08/17    Whipple procedure with Dr. Delano Metz, MD on 03/29/17.  Her postoperative course was complicated by intra-abdominal abscess, abdominal pain, gastrostomy tube induced gastric anastomotic dehiscence , and  prolonged hospitalization (with multiple readmissions)        Interval History:    Amanda Galloway is here today to review liver biopsy.   Pain to right shoulder secondary to rotator cuff tear and tendon tear. Scheduled for surgery 6/15    Past Medical/Surgical History:  1. Fibromyalgia  2. Hypothyroidism  3. S/p knee surgery  4. S/p hysterectomy  5. S/p shoulder surgery  6. S/p tonsillectomy    Current Medications:  Current Outpatient Medications on File Prior to Visit   Medication Sig Dispense Refill    oxyCODONE-acetaminophen (PERCOCET) 7.5-325 MG per tablet Take 1 tablet by mouth 4 times daily as needed for Pain Max daily dose: 4 tablets 120 tablet 0    traZODone (DESYREL) 50 MG tablet TAKE 1 TABLET BY MOUTH NIGHTLY 90 tablet 1    triamterene-hydrochlorothiazide (DYAZIDE) 37.5-25 MG per capsule TAKE 1 CAPSULE BY MOUTH EVERY MORNING 30 capsule 5    pregabalin (LYRICA) 150 MG capsule Take 1 capsule (150 mg total)  by mouth 2 times daily Max daily dose: 300 mg 60 capsule 2    pantoprazole (PROTONIX) 40 MG EC tablet Take 1 tablet (40 mg total) by mouth daily Swallow whole. Do not crush, break, or chew. 90 tablet 6    cyclobenzaprine (FLEXERIL) 10 MG tablet Take 1 tablet (10 mg total) by mouth nightly as needed for Muscle spasms 30 tablet 2    pancrelipase (CREON 24) 24000-76000 units DR capsule Take 1-3 capsules prior to meals and 1-2 prior to snacks 180 capsule 6    DULoxetine (CYMBALTA) 20 MG DR capsule Take 2 capsules (40 mg total) by mouth every evening 180 capsule 6    ferrous sulfate 325 (65 FE) MG tablet Take 1 tablet (325 mg total) by mouth daily (with breakfast) 100 tablet 3    levothyroxine (SYNTHROID, LEVOTHROID) 150 MCG tablet Take 1 tablet (150 mcg total) by mouth daily (before breakfast) 90 tablet 6    PREMARIN 0.625 MG tablet Take 1 tablet (0.625 mg total) by mouth every morning 90 tablet 3    Red Yeast Rice Extract (RED YEAST RICE PO) Take by mouth      docusate sodium (COLACE) 100 MG capsule Take 2 capsules (200 mg total) by mouth 2 times daily 120 capsule 5    ondansetron (ZOFRAN-ODT) 4 MG disintegrating tablet Take 1 tablet (4 mg total) by mouth 3 times daily as needed (nausea)   Place on top of tongue. 30 tablet 0    SUMAtriptan (IMITREX) 25 MG  tablet Take 1 tablet (25 mg total) by mouth as needed for Migraine   Take at onset of headache. May repeat once in 2 hours. 9 tablet 1    senna (SENOKOT) 8.6 MG tablet Take 2 tablets by mouth 2 times daily 120 tablet 5    Vitamins - senior (CENTRUM SILVER) TABS Take 1 tablet by mouth daily (Patient taking differently: Take 1 tablet by mouth every morning   ) 30 tablet 0     No current facility-administered medications on file prior to visit.      Social History: Divorced.  Worked in [employer].  Daughter [name]) worked on [employer] and now in infusion    Family History: Mother with breast cancer.  Father with cancer of unknown  type.    Review of Systems:   Review of Systems   Constitutional: Negative for appetite change, chills, fatigue, fever and unexpected weight change.   Eyes: Negative for icterus.   Respiratory: Negative for cough and shortness of breath.    Gastrointestinal: Negative for abdominal distention, abdominal pain, blood in stool, constipation, diarrhea, nausea and vomiting.   Genitourinary: Negative for dysuria and hematuria.    Musculoskeletal: Positive for arthralgias (right shoulder - rotator cuff tear). Negative for back pain.   Skin: Negative for rash.   Neurological: Negative for headaches, light-headedness and numbness.   Psychiatric/Behavioral: The patient is nervous/anxious.      Physical Exam:  Vitals:    08/15/18 1406   Pulse: 67   Resp: 17   Temp: 36.7 C (98.1 F)   Weight: 70.5 kg (155 lb 6.8 oz)     GENERAL: Well-appearing and well-nourished, in no acute distress. Alert and oriented. Anxious  Performance Status: ECOG PS 0    Liver biopsy negative for maliganncy    Assessment and Plan:   Amanda Galloway 73 y.o. female with resected pancreatic adenocarcinoma- recent scans are concerning for change sin lung and liver lesion.  Biopsy of this is negative for cancer,     Today, she presents taccompanied by her daughter [name].  We reviewed pathology and plan for repeat imagine late summer.    Plan:    RTC in late August after CT scans.    Amanda Laforte Lajean Manes, MD    I have seen an examined the patient and agree with the above outlined findings and plans as discussed with the APP and the patient.    Reichen Hutzler

## 2018-08-17 ENCOUNTER — Other Ambulatory Visit: Payer: Self-pay | Admitting: Internal Medicine

## 2018-08-26 ENCOUNTER — Other Ambulatory Visit: Payer: Self-pay | Admitting: Internal Medicine

## 2018-08-27 MED ORDER — PANCRELIPASE (LIP-PROT-AMYL) 24000 UNIT PO CPEP *I*
DELAYED_RELEASE_CAPSULE | ORAL | 6 refills | Status: DC
Start: 2018-08-27 — End: 2018-12-22

## 2018-09-10 ENCOUNTER — Ambulatory Visit: Payer: Medicare (Managed Care) | Admitting: Pain Medicine

## 2018-09-10 ENCOUNTER — Encounter: Payer: Self-pay | Admitting: Internal Medicine

## 2018-09-10 NOTE — Progress Notes (Signed)
HFM received [facility] form. Form faxed to [facility] on 09/09/18.

## 2018-09-11 ENCOUNTER — Encounter: Payer: Self-pay | Admitting: Internal Medicine

## 2018-09-11 ENCOUNTER — Other Ambulatory Visit
Admission: RE | Admit: 2018-09-11 | Discharge: 2018-09-11 | Disposition: A | Discharge status: Home / Self Care | Payer: Medicare (Managed Care) | Source: Ambulatory Visit | Attending: Internal Medicine | Admitting: Internal Medicine

## 2018-09-11 ENCOUNTER — Ambulatory Visit: Payer: Medicare (Managed Care) | Admitting: Internal Medicine

## 2018-09-11 VITALS — BP 113/84 | HR 68 | Temp 95.0°F | Ht 66.0 in | Wt 150.0 lb

## 2018-09-11 DIAGNOSIS — Z13820 Encounter for screening for osteoporosis: Secondary | ICD-10-CM

## 2018-09-11 DIAGNOSIS — E785 Hyperlipidemia, unspecified: Secondary | ICD-10-CM

## 2018-09-11 DIAGNOSIS — E039 Hypothyroidism, unspecified: Secondary | ICD-10-CM

## 2018-09-11 DIAGNOSIS — T451X5A Adverse effect of antineoplastic and immunosuppressive drugs, initial encounter: Secondary | ICD-10-CM

## 2018-09-11 DIAGNOSIS — Z7189 Other specified counseling: Secondary | ICD-10-CM

## 2018-09-11 DIAGNOSIS — G620 Drug-induced polyneuropathy: Secondary | ICD-10-CM

## 2018-09-11 DIAGNOSIS — Z1239 Encounter for other screening for malignant neoplasm of breast: Secondary | ICD-10-CM

## 2018-09-11 LAB — LIPID PANEL
Chol/HDL Ratio: 3.1
Cholesterol: 233 mg/dL — AB
HDL: 75 mg/dL — ABNORMAL HIGH (ref 40–60)
LDL Calculated: 130 mg/dL — AB
Non HDL Cholesterol: 158 mg/dL
Triglycerides: 141 mg/dL

## 2018-09-11 LAB — BASIC METABOLIC PANEL
Anion Gap: 14 (ref 7–16)
CO2: 24 mmol/L (ref 20–28)
Calcium: 9.1 mg/dL (ref 8.6–10.2)
Chloride: 103 mmol/L (ref 96–108)
Creatinine: 1.12 mg/dL — ABNORMAL HIGH (ref 0.51–0.95)
GFR,Black: 57 * — AB
GFR,Caucasian: 49 * — AB
Glucose: 96 mg/dL (ref 60–99)
Lab: 32 mg/dL — ABNORMAL HIGH (ref 6–20)
Potassium: 4.4 mmol/L (ref 3.3–5.1)
Sodium: 141 mmol/L (ref 133–145)

## 2018-09-11 LAB — TSH: TSH: 2.94 u[IU]/mL (ref 0.27–4.20)

## 2018-09-11 NOTE — Progress Notes (Signed)
[facility] FAMILY MEDICINE OUTPATIENT PROGRESS NOTE    Patient Name: Amanda Galloway  Age: 73 y.o. DOB: 1945-11-30  PCP: Sabino Gasser, MD  CCP: Bettey Muraoka      Reason for Appointment  Chief Complaint   Patient presents with    Follow-up     medications review     Other     Would like blood work done to test her cholesterol, and a referral to get a mammogram done        History of Present Illness  74 y.o. female with     1. meds -     Thyroid  Last TSH 11/2017    2. BP  Antihypertensive Medications             triamterene-hydrochlorothiazide (DYAZIDE) 37.5-25 MG per capsule TAKE 1 CAPSULE BY MOUTH EVERY MORNING      Takes more for fluid retention    3. Liver lesion - all resulted neg.     Feels like a lot of scar tissue  Forgets to take creon and has pain after eating  Eats a lot, though feels like poor appetite  Wt has been stable    Just had right shoulder surgery 6/15, just started PT  Right handed    Taking lyrica 150 mg twice daily  Reports having run out a few weeks ago from lyrica      Takes red yeast rice for cholesterol, not checked in several years    Due for mammo  Mom died of breast ca    Thinks colonoscopy about 10 yrs    Had pneumonia shots done in [place]    Premarin - started for hot flashes back when had hysterectomy  "please don't take me off my premarin"  Previously on a higher dose    Previously had a blood clot in lung              The current medications, current allergies, medical, surgical, family, and social history were reviewed.    Social History     Socioeconomic History    Marital status: Divorced     Spouse name: Not on file    Number of children: Not on file    Years of education: Not on file    Highest education level: Not on file   Occupational History    Not on file   Tobacco Use    Smoking status: Never Smoker    Smokeless tobacco: Never Used   Substance and Sexual Activity    Alcohol use: No    Drug use: No    Sexual activity: Never     Partners: Male   Social History  Narrative    Lives with daughter in [place]    Previously Glass blower/designer for [employer], now retired    Ex-husband in [place]: Negative for chills, diaphoresis and fever.   Respiratory: Negative for shortness of breath and wheezing.    Cardiovascular: Negative for chest pain and palpitations.   Gastrointestinal: Negative for abdominal pain, diarrhea, nausea and vomiting.   Skin: Negative for rash.   Neurological: Negative for dizziness and headaches.   Psychiatric/Behavioral: Negative for agitation and confusion.         Examination    Vitals:    09/11/18 0915   BP: 113/84   Pulse: 68   Temp: 35 C (95 F)   TempSrc: Temporal   Weight: 68 kg (  150 lb)   Height: 1.676 m (5\' 6" )         Physical Exam  Constitutional:       General: She is not in acute distress.     Appearance: She is well-developed.   HENT:      Head: Normocephalic and atraumatic.   Eyes:      Conjunctiva/sclera: Conjunctivae normal.      Pupils: Pupils are equal, round, and reactive to light.   Neck:      Musculoskeletal: Normal range of motion and neck supple.   Cardiovascular:      Rate and Rhythm: Normal rate and regular rhythm.      Heart sounds: Normal heart sounds. No murmur. No friction rub. No gallop.    Pulmonary:      Effort: Pulmonary effort is normal. No respiratory distress.      Breath sounds: Normal breath sounds. No wheezing or rales.   Abdominal:      General: There is no distension.      Palpations: Abdomen is soft.      Tenderness: There is no abdominal tenderness. There is no guarding or rebound.   Skin:     General: Skin is warm and dry.      Findings: No rash.   Neurological:      Mental Status: She is alert and oriented to person, place, and time.      Cranial Nerves: No cranial nerve deficit.      Coordination: Coordination normal.   Psychiatric:         Behavior: Behavior normal.       1. Peripheral neuropathy due to chemotherapy  Cont lyrica    2. Hypothyroidism,  unspecified type  Check TSH, cont current meds  - TSH; Future    3. Hyperlipidemia, unspecified hyperlipidemia type  Check lipid panel  - Lipid Panel (Reflex to Direct  LDL if Triglycerides more than 400); Future  - Basic metabolic panel; Future    4. Counseling for estrogen replacement therapy  Discussed risks/benefits, pt elects to continue    5. Breast cancer screening  - Mammography screening BILATERAL; Future    6. Osteoporosis screening  - DEXA Scan; Future      Assessment &Plan    Problem List Items Addressed This Visit     None            Follow Up    No follow-ups on file.    Future Appointments       Provider [place]    09/15/2018 3:00 PM Delano Metz, MD [place] at: Patient's Home     10/09/2018 11:50 AM RIS, RR CT2 (RRCT2) [facility]    10/22/2018 3:20 PM Hezel, Caffie Pinto, MD [place]. Davonna Belling, MD

## 2018-09-13 NOTE — Progress Notes (Deleted)
Telemedicine Encounter Note:    [facility]-GI Surgery Follow up note    Chief Complaint  Amanda Galloway is a 73 y.o. female who presents  on 09/13/2018 for HPB-GI follow-up care.     Diagnosis  Locally advanced Pancreatic adenocarinoma  Whipple on 03/29/2017    Physician Team  Surgeon:  Delano Metz, MD  PCP:  Celesta Aver, MD  Gastroenterologist: Fonnie Jarvis, MD  Medical Oncology: Araam Hezel Calton Dach), MD   Radiation Oncologist: Durene Romans, MD    Interval History  Last seen in office on 03/17/2018.***    Oncologic History   Over several months, patient noted worsening abdominal pain and weight loss. Extensive work-up including EGD, colonoscopy and imaging were unrevealing. After suggestive HIDA scan, she underwent cholecystectomy on September 06, 2016 but symptoms persisted.      MRCP on September 25, 2016 demonstrated2x2cm soft tissue mass-like abnormality in celiac region, contiguous with medial margin of uncinate process of pancreas concerning for pancreatic lession vs lymphadenopathy.     EUS/FNA on September 27, 2016 demonstrated a mass in pancreas uncinate process and lymph node pressing celiac axis. FNA confirmed adenocarcinoma.     She was transferred to [facility] for additional management. Abd/pelvis CT on September 30, 2016 revealed ill-defined prominent retroperitoneal soft tissue with central low attenuation suggestive of necrosis which abuts SMA and is adjacent to pancreatic uncinate process similar to prior MRI. Differential includes pancreatic neoplasm versus lymphadenopathy. CT angio on September 29, 2016 showed no pulmonary embolism. Nonspecific 3 mm nodules in right upper and middle lobe, recommend attention on follow-up imaging.      Diagnostic laparoscopy and port placement by Dr. Ron Agee on October 04, 2016 - no evidence of metastatic disease.     FOLFIRINOX chemotherapy - treatment start date 10/05/2016. Completed 8 cycles.     Neoadjuvant SBRT 25 Gy in 5 fractions pancreatic cancer, with concurrent  boost to 30 Gy in 5 fractions to tumor around SMA from 02/13/17-03/08/17     She underwent a Whipple procedure with Dr. Delano Metz, MD on 03/29/17.  She was briefly admitted to SICU post operatively for hemodynamic monitoring and the expectation of complicated pain management given patient on home methadone. 1/31 NGT placed for nausea/vomiting. TPN started 2/6. Multiple perc drains placed for fluid collections throughout her stay. On 04/16/17 patient had a PTC drain placed by IR and was transferred back to SICU for increased WOB and was intubated for worsening respiratory status. Chest tube placed 4/5 for left pleural effusion. 4/12 PEG tube placed for poor PO intake. 4/19 gastric perforation from PEG site. 6/7 downsizing of PEG d/t peristomal leakage. 6/13 positive for H. Pylori. Patient's hospital course was also been complicated by constipation, malnutrition, pain control, depression and fluid overload. She was readmitted several times to SICU during her stay.  Palliative care followed patient throughout her hospital stay to assist with pain management, anxiety, depression. All of the patient's drains were removed prior to going home. Her PICC line was removed as well. She was discharged from the hospital on 08/24/17 to home.      She was readmitted from clinic on 09/18/17-09/25/17 for  failure to thrive due to chronic nausea/vomiting, poor PO intake with suspected gastric outlet obstruction vs gastroparesis. She was placed on TPN for several days while inpatient as she needed to be NPO for various imaging studies. She had several imaging series with GI, which indicated her PEG tube was the cause of vomiting and discomfort. Her IR placed PEG tube  was removed 7/22. She tolerated the procedure well with no immediate complications. Pain and nausea immediately resolved.     At subsequent follow up visits noted to have continuous leakage from prior PEG site.  Local treatment with silver nitrate with temporary resolution.   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  Referred to GI for endoscopic treatment.       Medical History  Past Medical History:   Diagnosis Date    Arthritis     Basal cell carcinoma of skin     Left shin, deep.  Required skin grafting, 5FU cream.    Depression     Fibromyalgia     Hypothyroidism     Pancreatic cancer      Surgical History  Past Surgical History:   Procedure Laterality Date    APPENDECTOMY      CERVICAL FUSION      CHOLECYSTECTOMY, LAPAROSCOPIC  09/06/2016    HYSTERECTOMY  08/1986    Fibroids    KNEE SURGERY Right     PR INSERT TUNNELED CV CATH WITH PORT Right 10/04/2016    Procedure: Right IJ MEDIPORT Insertion;  Surgeon: Delano Metz, MD;  Location: [facility] MAIN OR;  Service: Oncology General    PR LAP,DIAGNOSTIC ABDOMEN N/A 10/04/2016    Procedure: LAPAROSCOPY DIAGNOSTIC;  Surgeon: Delano Metz, MD;  Location: [facility] MAIN OR;  Service: Oncology General    PR PART [place] PANC,PROX+REMV DUOD+ANAST N/A 03/29/2017    Procedure: WHIPPLE PROCEDURE;  Surgeon: Delano Metz, MD;  Location: [facility] MAIN OR;  Service: Oncology General    ROTATOR CUFF REPAIR Right     TONSILLECTOMY AND ADENOIDECTOMY      whipple       Family History  Family History   Problem Relation Age of Onset    Breast cancer Mother         died 14    Arthritis Mother     Cancer Father         NHL, died 47    Heart Disease Father     Diabetes Father     High Blood Pressure Father     Diabetes Sister     Obesity Sister         4 siblings    No Known Problems Daughter     High Blood Pressure Brother      Social History  Social History     Socioeconomic History    Marital status: Divorced     Spouse name: Not on file    Number of children: Not on file    Years of education: Not on file    Highest education level: Not on file   Occupational History    Not on file   Tobacco Use    Smoking status: Never Smoker    Smokeless tobacco: Never Used   Substance and Sexual Activity    Alcohol use: No    Drug use:  No    Sexual activity: Never     Partners: Male   Social History Narrative    Lives with daughter in [place]    Previously Glass blower/designer for [employer], now retired    Ex-husband in [place]       Medications  Outpatient Encounter Medications as of 09/15/2018   Medication Sig Dispense Refill    pancrelipase (CREON 24) 24000-76000 units DR capsule Take 1-3 capsules prior to meals and 1-2 prior to snacks 180 capsule 6    pregabalin (LYRICA) 150 MG capsule TAKE  1 CAPSULE BY MOUTH TWO TIMES DAILY MAXIMUM DAILY DOSE OF 2 PER DAY 60 capsule 2    traZODone (DESYREL) 50 MG tablet TAKE 1 TABLET BY MOUTH NIGHTLY 90 tablet 1    triamterene-hydrochlorothiazide (DYAZIDE) 37.5-25 MG per capsule TAKE 1 CAPSULE BY MOUTH EVERY MORNING 30 capsule 5    pantoprazole (PROTONIX) 40 MG EC tablet Take 1 tablet (40 mg total) by mouth daily Swallow whole. Do not crush, break, or chew. 90 tablet 6    DULoxetine (CYMBALTA) 20 MG DR capsule Take 2 capsules (40 mg total) by mouth every evening 180 capsule 6    levothyroxine (SYNTHROID, LEVOTHROID) 150 MCG tablet Take 1 tablet (150 mcg total) by mouth daily (before breakfast) 90 tablet 6    PREMARIN 0.625 MG tablet Take 1 tablet (0.625 mg total) by mouth every morning 90 tablet 3    Red Yeast Rice Extract (RED YEAST RICE PO) Take by mouth      docusate sodium (COLACE) 100 MG capsule Take 2 capsules (200 mg total) by mouth 2 times daily 120 capsule 5    ondansetron (ZOFRAN-ODT) 4 MG disintegrating tablet Take 1 tablet (4 mg total) by mouth 3 times daily as needed (nausea)   Place on top of tongue. 30 tablet 0    senna (SENOKOT) 8.6 MG tablet Take 2 tablets by mouth 2 times daily 120 tablet 5    Vitamins - senior (CENTRUM SILVER) TABS Take 1 tablet by mouth daily (Patient taking differently: Take 1 tablet by mouth every morning   ) 30 tablet 0     No facility-administered encounter medications on file as of 09/15/2018.       Allergies  No Known Allergies (drug, envir, food or  latex)     Review of Systems  ROS     Physical Exam  There were no vitals taken for this visit.     Physical Exam  Laboratory Data    Pathology  08/08/2018   Surgical Pathology   FINAL DIAGNOSIS:   Liver, "lesion", needle biopsy:    - Macrovesicular steatosis, focal.    - No significant fibrosis or inflammation.    - No malignancy or granulomas identified.    - See comment.     COMMENT:   Deeper sections were obtained but afforded no additional diagnostic   material.  Aside from the steatosis no other lesion is identified.     MICROSCOPIC EXAMINATION: Performed     Imaging Data  CT abdomen and pelvis with contrast   07/29/2018   1. Status post Whipple procedure for pancreatic cancer. Interval development of a low-density hepatic lesion with ill-defined margins in hepatic segment 3, this may represent a metastasis or developing abscess. 2. Mildly enlarged periceliac node and soft tissue material extending along the celiac trunk and common hepatic artery, May represent postsurgical changes or metastasis. This can be reassessed at the time of follow-up exam. END OF IMPRESSION       Assessment  Amanda Galloway is a 73 y.o. female with a history of malignant neoplasm of the pancreas who underwent a Whipple procedure with Dr. Delano Metz, MD on 03/29/17.  She had a complicated hospital stay which required multiple drains, multiple readmissions to the ICU, TPN, a PEG tube, and multiple scans. She was discharged on 08/24/17.    She has recovered remarkably well since her surgery and prolonged hospitalization. CT imaging showed no evidence of recurrent disease or any acute findings for her abdominal pain. Currently she  no longer has any more abdominal pain. She had fistula repaired by Dr. Foye Spurling with now no more leakage from previous PEG site.     She continues to have some significant neck and shoulder pain. Next month she is scheduled for injections in this area. We dicussed trying other alternative methods such as massage or  acupuncture. Most recently she had several episodes of incontinence with no there urinary symptopms. We will have her follow up with urology and recommended doing kegel exercises.     Plan   Defer labs and imaging to medical oncology.    Scheduled for follow up with Dr Jeannetta Nap in next month.    Seek second opinion with Ortho or Neurosurgery.    Referral to urology for incontinence.    Follow up in 6 months or sooner if needed.     Tresa Endo, PA

## 2018-09-15 ENCOUNTER — Ambulatory Visit: Payer: Medicare (Managed Care) | Admitting: Surgical Oncology

## 2018-09-16 ENCOUNTER — Telehealth: Payer: Self-pay

## 2018-09-16 NOTE — Telephone Encounter (Signed)
DEXA Scan appointment, and mammogram scheduled for this patient on 10/28/18 at  1:00pm,  at  [facility]. Letter mailed to patient to inform.

## 2018-09-19 ENCOUNTER — Telehealth: Payer: Self-pay | Admitting: Internal Medicine

## 2018-09-19 NOTE — Telephone Encounter (Signed)
Patient called is requesting medication for UTI. Patient states does not want to schedule appt.

## 2018-09-22 ENCOUNTER — Telehealth: Payer: Self-pay | Admitting: Oncology

## 2018-09-22 NOTE — Telephone Encounter (Signed)
Pt really needs appt, even a telehome with a urine drop-off at lab. Please ask symptoms, triage to UC if no appts

## 2018-09-22 NOTE — Telephone Encounter (Signed)
Pt was seen at [facility] on 09/11/18.  Pt stated that she will go to lab and leave a urine sample.  Pt reports burning with urination began on 09/19/18.  Cloudy urine has been "for quite some time".  Pt was advised to increase fluid intake.    Please place lab order.  Thank you.

## 2018-09-26 ENCOUNTER — Telehealth: Payer: Self-pay | Admitting: Oncology

## 2018-09-26 NOTE — Telephone Encounter (Signed)
Spoke to [place] and let her know she is scheduled for all scans on 8/6; CT chest, abdomen, pelvis.  [name] appreciative of call.

## 2018-10-09 ENCOUNTER — Ambulatory Visit
Admission: RE | Admit: 2018-10-09 | Discharge: 2018-10-09 | Disposition: A | Discharge status: Home / Self Care | Payer: Medicare (Managed Care) | Source: Ambulatory Visit | Attending: Oncology | Admitting: Oncology

## 2018-10-09 DIAGNOSIS — K7689 Other specified diseases of liver: Secondary | ICD-10-CM | POA: Insufficient documentation

## 2018-10-09 DIAGNOSIS — C259 Malignant neoplasm of pancreas, unspecified: Secondary | ICD-10-CM

## 2018-10-09 DIAGNOSIS — C250 Malignant neoplasm of head of pancreas: Secondary | ICD-10-CM

## 2018-10-09 DIAGNOSIS — R911 Solitary pulmonary nodule: Secondary | ICD-10-CM | POA: Insufficient documentation

## 2018-10-09 DIAGNOSIS — R0602 Shortness of breath: Secondary | ICD-10-CM

## 2018-10-09 MED ORDER — IOHEXOL 350 MG/ML (OMNIPAQUE) IV SOLN *I*
1.0000 mL | Freq: Once | INTRAVENOUS | Status: AC
Start: 2018-10-09 — End: 2018-10-09
  Administered 2018-10-09: 100 mL via INTRAVENOUS

## 2018-10-09 MED ORDER — STERILE WATER FOR IRRIGATION IR SOLN *I*
900.0000 mL | Freq: Once | Status: AC
Start: 2018-10-09 — End: 2018-10-09
  Administered 2018-10-09: 900 mL via ORAL

## 2018-10-15 ENCOUNTER — Ambulatory Visit: Payer: Medicare (Managed Care) | Admitting: Oncology

## 2018-10-22 ENCOUNTER — Ambulatory Visit: Payer: Medicare (Managed Care) | Attending: Oncology | Admitting: Oncology

## 2018-10-22 VITALS — BP 122/55 | HR 71 | Temp 97.7°F | Resp 18 | Wt 154.2 lb

## 2018-10-22 DIAGNOSIS — C250 Malignant neoplasm of head of pancreas: Secondary | ICD-10-CM

## 2018-10-28 ENCOUNTER — Ambulatory Visit
Admission: RE | Admit: 2018-10-28 | Discharge: 2018-10-28 | Disposition: A | Discharge status: Home / Self Care | Payer: Medicare (Managed Care) | Source: Ambulatory Visit | Attending: Internal Medicine | Admitting: Internal Medicine

## 2018-10-28 ENCOUNTER — Telehealth: Payer: Self-pay | Admitting: Internal Medicine

## 2018-10-28 ENCOUNTER — Ambulatory Visit: Payer: Medicare (Managed Care)

## 2018-10-28 DIAGNOSIS — Z1239 Encounter for other screening for malignant neoplasm of breast: Secondary | ICD-10-CM

## 2018-10-28 DIAGNOSIS — Z803 Family history of malignant neoplasm of breast: Secondary | ICD-10-CM | POA: Insufficient documentation

## 2018-10-28 DIAGNOSIS — Z13820 Encounter for screening for osteoporosis: Secondary | ICD-10-CM

## 2018-10-28 DIAGNOSIS — Z780 Asymptomatic menopausal state: Secondary | ICD-10-CM

## 2018-10-28 DIAGNOSIS — Z1231 Encounter for screening mammogram for malignant neoplasm of breast: Secondary | ICD-10-CM | POA: Insufficient documentation

## 2018-10-28 DIAGNOSIS — M8589 Other specified disorders of bone density and structure, multiple sites: Secondary | ICD-10-CM

## 2018-10-28 NOTE — Telephone Encounter (Signed)
Patient called requesting to reduce Lyrica dosage from 150 mg to 75 mg, 2 times daily? Please send new Rx to [facility] on [address].

## 2018-10-28 NOTE — Progress Notes (Signed)
MEDICAL ONCOLOGY PROGRESS NOTE    Reason for Evaluation: Follow-up    Diagnosis: Pancreatic adenocarcinoma    Stage: Locally advanced S/P resection    Diagnosis and Treatment History:  Over several months, patient noted worsening abdominal pain and weight loss.  MRCP on September 25, 2016 demonstrated 2x2cm soft tissue mass-like abnormality in celiac region, contiguous with medial margin of uncinate process of pancreas concerning for pancreatic lession vs lymphadenopathy.    EUS/FNA on September 27, 2016 demonstrated a mass in pancreas uncinate process and lymph node pressing celiac axis. FNA confirmed adenocarcinoma.    Neoadjuvant FOLFIRINOX - 8 cycles completed 8/32018 - 01/11/2017,     Neoadjuvant SBRT 25 Gy in 5 fractions pancreatic cancer, with concurrent boost to 30 Gy in 5 fractions to tumor around SMA from 02/13/17-03/08/17    Whipple procedure with Dr. Delano Metz, MD on 03/29/17.  Her postoperative course was complicated by intra-abdominal abscess, abdominal pain, gastrostomy tube induced gastric anastomotic dehiscence , and  prolonged hospitalization (with multiple readmissions)        Interval History:    Amanda Galloway is here today to review scans with Daughter Amanda Galloway.   Right shoulder cuff tear and tendon tar now repaired by surgery 6/15   Feeling much better with regard to shoulder pain.   No other new symptoms or concerns.    Past Medical/Surgical History:  1. Fibromyalgia  2. Hypothyroidism  3. S/p knee surgery  4. S/p hysterectomy  5. S/p shoulder surgery  6. S/p tonsillectomy    Current Medications:  Current Outpatient Medications on File Prior to Visit   Medication Sig Dispense Refill    pancrelipase (CREON 24) 24000-76000 units DR capsule Take 1-3 capsules prior to meals and 1-2 prior to snacks 180 capsule 6    pregabalin (LYRICA) 150 MG capsule TAKE 1 CAPSULE BY MOUTH TWO TIMES DAILY MAXIMUM DAILY DOSE OF 2 PER DAY 60 capsule 2    traZODone (DESYREL) 50 MG tablet TAKE 1 TABLET BY MOUTH NIGHTLY 90 tablet 1     triamterene-hydrochlorothiazide (DYAZIDE) 37.5-25 MG per capsule TAKE 1 CAPSULE BY MOUTH EVERY MORNING 30 capsule 5    pantoprazole (PROTONIX) 40 MG EC tablet Take 1 tablet (40 mg total) by mouth daily Swallow whole. Do not crush, break, or chew. 90 tablet 6    DULoxetine (CYMBALTA) 20 MG DR capsule Take 2 capsules (40 mg total) by mouth every evening 180 capsule 6    levothyroxine (SYNTHROID, LEVOTHROID) 150 MCG tablet Take 1 tablet (150 mcg total) by mouth daily (before breakfast) 90 tablet 6    PREMARIN 0.625 MG tablet Take 1 tablet (0.625 mg total) by mouth every morning 90 tablet 3    Red Yeast Rice Extract (RED YEAST RICE PO) Take by mouth      docusate sodium (COLACE) 100 MG capsule Take 2 capsules (200 mg total) by mouth 2 times daily 120 capsule 5    ondansetron (ZOFRAN-ODT) 4 MG disintegrating tablet Take 1 tablet (4 mg total) by mouth 3 times daily as needed (nausea)   Place on top of tongue. 30 tablet 0    senna (SENOKOT) 8.6 MG tablet Take 2 tablets by mouth 2 times daily 120 tablet 5    Vitamins - senior (CENTRUM SILVER) TABS Take 1 tablet by mouth daily (Patient taking differently: Take 1 tablet by mouth every morning   ) 30 tablet 0     No current facility-administered medications on file prior to visit.  Social History: Divorced.  Worked in [employer].  Daughter Amanda Galloway) worked on [employer] and now in infusion    Family History: Mother with breast cancer.  Father with cancer of unknown type.    Review of Systems:   Review of Systems   Constitutional: Negative for appetite change, chills, fatigue, fever and unexpected weight change.   Eyes: Negative for icterus.   Respiratory: Negative for cough and shortness of breath.    Gastrointestinal: Negative for abdominal distention, abdominal pain, blood in stool, constipation, diarrhea, nausea and vomiting.   Genitourinary: Negative for dysuria and hematuria.    Musculoskeletal: Negative for arthralgias (right shoulder -  rotator cuff tear) and back pain.   Skin: Negative for rash.   Neurological: Negative for headaches, light-headedness and numbness.   Psychiatric/Behavioral: Negative for depression. The patient is nervous/anxious.      Physical Exam:  Vitals:    10/22/18 1519   BP: 122/55   Pulse: 71   Resp: 18   Temp: 36.5 C (97.7 F)   Weight: 70 kg (154 lb 3.4 oz)     GENERAL: Well-appearing and well-nourished, in no acute distress. Alert and oriented. Anxious  Performance Status: ECOG PS 0    Liver biopsy negative for maliganncy    CT scans August 2020:  Interval increase in size of the left lower lobe pulmonary nodule now measuring 10 x 8 mm compared with 8 x 5 mm on the prior examination. The multiple smaller nodules also appear slightly increased in size in the interval however this is difficult to    define with measurements as they are less than 4 mm in size on the current study. Concerning for metastatic disease. Tissue sampling could be performed if clinically indicated.        Assessment and Plan:   Amanda Galloway 73 y.o. female with resected pancreatic adenocarcinoma- recent scans show changes in lung and liver lesions.  Biopsy of liver is negative for cancer, and repeat scans show small interval changes in lungs.  [name] feels very well.     Today, she presents taccompanied by her daughter Amanda Galloway.  We reviewed repeat scans and reviewed images.     I discussed several options to understand these radiographic changes which could be related to inflamatory or non cancer causes but could represent early growth of recurrent cancer.    We discussed:  1) repeat imaging in 3 months, 2) consideration of biopsy, or 3)PET.  After discussion we elected to follow radiographically      Plan:    RTC in late Nov after CT scans.   Review images with Dr. Nead from [place], [place]

## 2018-10-29 MED ORDER — PREGABALIN 75 MG PO CAPS *I*
75.0000 mg | ORAL_CAPSULE | Freq: Two times a day (BID) | ORAL | 0 refills | Status: DC
Start: 2018-10-29 — End: 2018-12-22

## 2018-10-29 NOTE — Addendum Note (Signed)
Addended by: Loanne Drilling on: 10/29/2018 12:45 PM     Modules accepted: Orders

## 2018-10-29 NOTE — Telephone Encounter (Signed)
Sent 75 mg bid rx -DP

## 2018-10-31 ENCOUNTER — Other Ambulatory Visit: Payer: Self-pay | Admitting: Radiology

## 2018-10-31 ENCOUNTER — Other Ambulatory Visit: Payer: Self-pay | Admitting: Gastroenterology

## 2018-10-31 NOTE — Procedures (Signed)
Providers: DXA scan for your review.  Images and report are located under the   imaging (  Scans on order )  and/or the media tab ( under dxa/year )  .   For Mychart active patients, please contact your referring provider for results.

## 2018-11-03 ENCOUNTER — Other Ambulatory Visit: Payer: Self-pay | Admitting: Radiology

## 2018-11-03 DIAGNOSIS — R928 Other abnormal and inconclusive findings on diagnostic imaging of breast: Secondary | ICD-10-CM

## 2018-11-04 ENCOUNTER — Encounter: Payer: Self-pay | Admitting: Family Medicine

## 2018-11-04 ENCOUNTER — Ambulatory Visit: Payer: Medicare (Managed Care) | Attending: Family Medicine | Admitting: Family Medicine

## 2018-11-04 VITALS — BP 96/57 | HR 82 | Temp 99.0°F | Ht 66.0 in | Wt 154.0 lb

## 2018-11-04 DIAGNOSIS — R05 Cough: Secondary | ICD-10-CM

## 2018-11-04 DIAGNOSIS — R059 Cough, unspecified: Secondary | ICD-10-CM

## 2018-11-04 DIAGNOSIS — R509 Fever, unspecified: Secondary | ICD-10-CM

## 2018-11-04 DIAGNOSIS — I959 Hypotension, unspecified: Secondary | ICD-10-CM

## 2018-11-04 DIAGNOSIS — Z20828 Contact with and (suspected) exposure to other viral communicable diseases: Secondary | ICD-10-CM | POA: Insufficient documentation

## 2018-11-04 DIAGNOSIS — R52 Pain, unspecified: Secondary | ICD-10-CM

## 2018-11-04 NOTE — Patient Instructions (Signed)
What is Coronavirus disease 2019 (COVID-19)?    Coronaviruses are a large family of viruses. Some cause illness in people, and others, such as canine and feline coronaviruses, only infect animals.  Most coronaviruses in humans are known to cause what we often refer to as the “common cold.”  Rarely, coronaviruses can be come much more serious and have caused more serious infections such as Middle East Respiratory Syndrome (MERS) and Severe Acute Respiratory Syndrome (SARS).  COVID-19 appears to be more serious than the routine coronaviruses that cause colds, but not as severe as MERS or SARS.    What are the symptoms of Coronavirus disease 2019 (COVID-19)?    Reported illnesses have ranged from mild symptoms to severe illness and death for confirmed coronavirus disease 2019 (COVID-19) cases.  The following symptoms may appear 2-14 days after exposure.  • Fever  • Cough  • Shortness of breath    Prevention    There is currently no vaccine to prevent coronavirus disease 2019 (COVID-19). The best way to prevent illness is to avoid being exposed to this virus. However, as a reminder, CDC always recommends everyday preventive actions to help prevent the spread of respiratory diseases, including:  • Avoid close contact with people who are sick.  • Avoid touching your eyes, nose, and mouth.  • Stay home when you are sick.  • Cover your cough or sneeze with a tissue, then throw the tissue in the trash.  • Clean and disinfect frequently touched objects and surfaces using a regular household cleaning spray or wipe.  • Follow CDC's recommendations for using a facemask.   o CDC does not recommend that people who are well wear a facemask to protect themselves from respiratory diseases, including COVID-19.  o Facemasks should be used by people who show symptoms of COVID-19 to help prevent the spread of the disease to others. The use of facemasks is also crucial for health workers.  • Wash your hands often with soap and water for  at least 20 seconds, especially after going to the bathroom; before eating; and after blowing your nose, coughing, or sneezing.   o If soap and water are not readily available, use an alcohol-based hand sanitizer with at least 60% alcohol. Always wash hands with soap and water if hands are visibly dirty.    Coronavirus (COVID-19) Testing    Your healthcare professional will work with your state's public health department and CDC to determine if you need to be tested for COVID-19.  Only certain persons meet criteria for COVID-19 testing and testing will depend on state and CDC guidelines.    IF YOU HAD A COVID-19 TEST TODAY: You will be contacted with your results if they are positive. Regardless of if you are contacted or not it is important for you to maintain any quarantine that was recommended. Please ensure that the phone number included with your medical record is an accurate phone number in order for us to reach you. Please do not call the emergency department, as results cannot be given this way.    If you test positive, remain in isolation until your symptoms are improving and you have not had a fever for at least 72 hours (without the use of fever reducing medicines such as ibuprofen and Tylenol).    If your symptoms are worsening (such as developing shortness of breath, chest pain, or confusion) please call ahead to your local emergency room to let them know you are coming so that   they can prepare, or let the EMS crew know on their arrival that you have COVID-19 so that they can wear appropriate protection.  Remain isolated from others in your home, cover any coughs/sneezes, do not share common household items (such as cups and silverware) and wash hands frequently.    IF YOU WERE TOLD YOU MAY HAVE COVID - 19 BUT DID NOT QUALIFY FOR A TEST TODAY: Remain in isolation.  If your symptoms are worsening (such as developing shortness of breath, chest pain, or confusion) please call ahead to your local emergency  room to let them know you are coming so that they can prepare, or let the EMS crew know on their arrival that you may have COVID-19 so that they can wear appropriate protection.  Remain isolated from others in your home, cover any coughs/sneezes, do not share common household items (such as cups and silverware) and wash hands frequently.

## 2018-11-04 NOTE — Progress Notes (Signed)
[facility] - Outpatient Progress Note    SUBJECTIVE    Amanda Galloway is a 73 y.o. female who presents, for Fever; Generalized Body Aches; and Cough    Symptoms started Saturday   Reports went to the gym last week when it opened and is concerned may have been exposed to [place], has been wearing mask.   Temp up to 98.7 which is "high for me". Usually temp runs 80F.   Also having chills, body aches, sweats  Taking Ibuprofen every 4 hours, last dose 4 hours ago.    Feels mildly Short of breath and mildly unsteady on feet but reports she feels she is still recovering from her surgery last year.   Hx of pancreatic cancer, s/p whipple procedure on 03/29/17 with a lot of residual abdominal pain and discomfort.   Lives with daughter who is oncology nurse at [employer], daughter feeling well, without symptoms per pt.     Patient's medications, allergies, past medical, social and family histories were reviewed and updated as appropriate.    Review of Systems (at encounter)   Constitutional: see HPI.   Respiratory: see HPI.    Cardiovascular: Negative for chest pain.   Gastrointestinal: Negative for new abdominal pain, N/V/D.     OBJECTIVE  Vitals Reviewed:  BP 96/57 (BP Location: Right arm, Patient Position: Standing, Cuff Size: adult)    Pulse 82    Temp 37.2 C (99 F) (Temporal)    Ht 1.676 m (5\' 6" ) Comment: trs   Wt 69.9 kg (154 lb)    SpO2 95%    BMI 24.86 kg/m     Wt Readings from Last 3 Encounters:   11/04/18 69.9 kg (154 lb)   10/22/18 70 kg (154 lb 3.4 oz)   09/11/18 68 kg (150 lb)      BP Readings from Last 3 Encounters:   11/04/18 96/57   10/22/18 122/55   09/11/18 113/84     Gen: NAD, alert and oriented X3  Psych:  Mood anxious.  Speech normal in thought and content.   CV: RRR, no murmur, rubs, gallops, PMI not displaced  Pulm:  Respirations regular, unlabored. Breath sounds clear to auscultation bilaterally with no adventitious breath sounds. Full and symmetrical lung expansion.  Extremities: no  edema  Neuro: no focal deficits, steady gait transferring from chair to exam table.     ASSESSMENT & PLAN  1. Fever, unspecified fever cause  2. Cough  3. Body aches  Acute. Will test for COVID. Advised self-quarantine. Tylenol/NSAIDs for symptom relief, rest, hydration. Close monitoring and follow up.   Given oncology hx, advised strict Warning signs and return or ED precautions with understanding verbalized.   If COVID negative and ongoing symptoms would order CBC, further workup at f/u appt. She does have CT chest scheduled next week, however may consider CXR if ongoing symptoms.   - COVID-19 PCR; Future    4. Hypotension, unspecified hypotension type  New problem, requiring further evaluation  Improved on recheck in office.   Denies feeling dizzy or lightheaded at this time.   Encouraged increased hydration, close follow up.   Lives with daughter, not alone.   Warning signs and return precautions discussed with understanding verbalized.     Follow-up: Return in about 2 days (around 11/06/2018).; sooner if problems    Patient verbalized understanding of information presented,and confirmed agreement with current plan of care.  Reviewed risks, benefits, and administration of prescribed/refilled medications. Precautions reviewed.    Signed:  Brooklynn Brandenburg, NP on 11/04/2018 at 4:53 PM

## 2018-11-05 LAB — COVID-19 NAAT (PCR): COVID-19 NAAT (PCR): NEGATIVE

## 2018-11-05 LAB — COVID-19 PCR

## 2018-11-06 ENCOUNTER — Other Ambulatory Visit: Payer: Self-pay | Admitting: Internal Medicine

## 2018-11-07 ENCOUNTER — Ambulatory Visit: Payer: Medicare (Managed Care) | Admitting: Student in an Organized Health Care Education/Training Program

## 2018-11-07 NOTE — Progress Notes (Deleted)
[facility] - Outpatient Progress Note    SUBJECTIVE    Amanda Galloway is a 73 y.o. female who presents to clinic with the following concerns:    No chief complaint on file.        Medications, Allergies, Past Medical, Surgical Histories briefly reviewed    Past Medical History:   Diagnosis Date    Arthritis     Basal cell carcinoma of skin     Left shin, deep.  Required skin grafting, 5FU cream.    Depression     Fibromyalgia     History of chemotherapy 2018    History of chemotherapy 2018    Hypothyroidism     Pancreatic cancer           Current Outpatient Medications:     pregabalin (LYRICA) 75 MG capsule, Take 1 capsule (75 mg total) by mouth 2 times daily Max daily dose: 150 mg, Disp: 180 capsule, Rfl: 0    pancrelipase (CREON 24) 24000-76000 units DR capsule, Take 1-3 capsules prior to meals and 1-2 prior to snacks, Disp: 180 capsule, Rfl: 6    traZODone (DESYREL) 50 MG tablet, TAKE 1 TABLET BY MOUTH NIGHTLY, Disp: 90 tablet, Rfl: 1    triamterene-hydrochlorothiazide (DYAZIDE) 37.5-25 MG per capsule, TAKE 1 CAPSULE BY MOUTH EVERY MORNING, Disp: 30 capsule, Rfl: 5    pantoprazole (PROTONIX) 40 MG EC tablet, Take 1 tablet (40 mg total) by mouth daily Swallow whole. Do not crush, break, or chew., Disp: 90 tablet, Rfl: 6    DULoxetine (CYMBALTA) 20 MG DR capsule, Take 2 capsules (40 mg total) by mouth every evening, Disp: 180 capsule, Rfl: 6    levothyroxine (SYNTHROID, LEVOTHROID) 150 MCG tablet, Take 1 tablet (150 mcg total) by mouth daily (before breakfast), Disp: 90 tablet, Rfl: 6    PREMARIN 0.625 MG tablet, Take 1 tablet (0.625 mg total) by mouth every morning, Disp: 90 tablet, Rfl: 3    Red Yeast Rice Extract (RED YEAST RICE PO), Take by mouth, Disp: , Rfl:     docusate sodium (COLACE) 100 MG capsule, Take 2 capsules (200 mg total) by mouth 2 times daily, Disp: 120 capsule, Rfl: 5    ondansetron (ZOFRAN-ODT) 4 MG disintegrating tablet, Take 1 tablet (4 mg total) by mouth 3  times daily as needed (nausea)   Place on top of tongue., Disp: 30 tablet, Rfl: 0    senna (SENOKOT) 8.6 MG tablet, Take 2 tablets by mouth 2 times daily, Disp: 120 tablet, Rfl: 5    Vitamins - senior (CENTRUM SILVER) TABS, Take 1 tablet by mouth daily (Patient taking differently: Take 1 tablet by mouth every morning   ), Disp: 30 tablet, Rfl: 0       Social History:   reports that she has never smoked. She has never used smokeless tobacco. She reports that she does not drink alcohol, use drugs, or engage in sexual activity.    ROS: Complete ROS negative apart from that noted in HPI      OBJECTIVE  There were no vitals taken for this visit.    Physical Exam          ASSESSMENT & PLAN    There are no diagnoses linked to this encounter.         Future Appointments   Date Time Provider [place]   11/07/2018 10:50 AM Elmon Else, DO HF2 None   11/13/2018  3:00 PM RIS, RC BI4 (RCMM4) RCM [facility]  11/13/2018  4:00 PM RIS, RC BI US2 (RCUS2) [facility]   01/14/2019 12:50 PM RIS, RR CT2 (RRCT2) ER[facility]   01/21/2019  3:20 PM Hezel, Caffie Pinto, MD CGI None       Elmon Else, [facility]

## 2018-11-13 ENCOUNTER — Ambulatory Visit
Admission: RE | Admit: 2018-11-13 | Discharge: 2018-11-13 | Disposition: A | Discharge status: Home / Self Care | Payer: Medicare (Managed Care) | Source: Ambulatory Visit | Attending: Radiology | Admitting: Radiology

## 2018-11-13 ENCOUNTER — Ambulatory Visit
Admission: RE | Admit: 2018-11-13 | Discharge: 2018-11-13 | Disposition: A | Discharge status: Home / Self Care | Payer: Medicare (Managed Care) | Source: Ambulatory Visit

## 2018-11-13 DIAGNOSIS — R928 Other abnormal and inconclusive findings on diagnostic imaging of breast: Secondary | ICD-10-CM

## 2018-12-01 ENCOUNTER — Other Ambulatory Visit: Payer: Self-pay | Admitting: Internal Medicine

## 2018-12-02 ENCOUNTER — Telehealth: Payer: Self-pay | Admitting: Internal Medicine

## 2018-12-02 NOTE — Telephone Encounter (Signed)
Patient states she has questions and concerns about medication and her anxiety level. Writer offered patient an appointment and patient declined stating she is moving and out of town.

## 2018-12-03 NOTE — Telephone Encounter (Signed)
See call from evening rn

## 2018-12-03 NOTE — Telephone Encounter (Signed)
Pt is feeling very overwhelmed with things right now. Her daughter sold her townhouse and is moving on her own leaving pt to work on a new living situation. Her son and his family live in [place] and she is there now trying to get things figured out. 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 [name] never had this problem before so this is pretty new to her and she can't come in to be seen to discuss it, but desperately hopes for something to help her. Her daughter in law has some Escitalopram 10mg  tabs and offered pt to try one. Writer advised strongly against it, but that's how desperate she's feeling at the moment. If an appointment is needed to discuss this further is it possible to do a tele-med visit? Pt knows she's going to have to find another health care provider and that's one of those things making her feel as though she has lost some control in her life. If the provider is willing to prescribe something it would need to go to the [facility] in [place]. Phcy has been loaded into pt chart.

## 2018-12-04 MED ORDER — DULOXETINE HCL 60 MG PO CPEP *I*
60.0000 mg | DELAYED_RELEASE_CAPSULE | Freq: Every day | ORAL | 5 refills | Status: DC
Start: 2018-12-04 — End: 2018-12-22

## 2018-12-04 MED ORDER — HYDROXYZINE HCL 10 MG PO TABS *I*
10.0000 mg | ORAL_TABLET | Freq: Three times a day (TID) | ORAL | 0 refills | Status: DC | PRN
Start: 2018-12-04 — End: 2018-12-22

## 2018-12-04 NOTE — Addendum Note (Signed)
Addended by: Loanne Drilling on: 12/04/2018 02:47 PM     Modules accepted: Orders

## 2018-12-04 NOTE — Telephone Encounter (Signed)
Spoke w pt  Discussed options for anxiety  1) incr cymbalta to 60  2) add buspar  3) add prn med hydroxyzine or less ideally benzo    She said she already is "hooked" on lyrica and doesn't like that feeling, does not want benzo    Decided to incr cymbalta to 60 daily and add prn hydroxyzine 10-20 mg; meds sent to [facility]    She will reach out if things are not working    She is planning to relocate to [place] since her son lives there    -DP

## 2018-12-08 ENCOUNTER — Other Ambulatory Visit: Payer: Self-pay | Admitting: Internal Medicine

## 2018-12-13 ENCOUNTER — Other Ambulatory Visit: Payer: Self-pay | Admitting: Internal Medicine

## 2018-12-22 ENCOUNTER — Other Ambulatory Visit: Payer: Self-pay

## 2018-12-22 ENCOUNTER — Telehealth: Payer: Self-pay | Admitting: Internal Medicine

## 2018-12-22 ENCOUNTER — Telehealth: Payer: Self-pay | Admitting: Oncology

## 2018-12-22 DIAGNOSIS — C259 Malignant neoplasm of pancreas, unspecified: Secondary | ICD-10-CM

## 2018-12-22 MED ORDER — TRIAMTERENE-HCTZ 37.5-25 MG PO CAPS *A*
1.0000 | ORAL_CAPSULE | Freq: Every morning | ORAL | 5 refills | Status: DC
Start: 2018-12-22 — End: 2019-09-10

## 2018-12-22 MED ORDER — ONDANSETRON 4 MG PO TBDP *I*
4.0000 mg | ORAL_TABLET | Freq: Three times a day (TID) | ORAL | 0 refills | Status: AC | PRN
Start: 2018-12-22 — End: ?

## 2018-12-22 MED ORDER — DULOXETINE HCL 60 MG PO CPEP *I*
60.0000 mg | DELAYED_RELEASE_CAPSULE | Freq: Every day | ORAL | 5 refills | Status: AC
Start: 2018-12-22 — End: 2019-06-20

## 2018-12-22 MED ORDER — SENNOSIDES 8.6 MG PO TABS *I*
2.0000 | ORAL_TABLET | Freq: Two times a day (BID) | ORAL | 5 refills | Status: AC
Start: 2018-12-22 — End: ?

## 2018-12-22 MED ORDER — MULTIVITAMIN ADULTS 50+ PO TABS *I*
1.0000 | ORAL_TABLET | Freq: Every day | ORAL | 0 refills | Status: AC
Start: 2018-12-22 — End: ?

## 2018-12-22 MED ORDER — TRAZODONE HCL 50 MG PO TABS *I*
50.0000 mg | ORAL_TABLET | Freq: Every evening | ORAL | 1 refills | Status: DC
Start: 2018-12-22 — End: 2019-07-15

## 2018-12-22 MED ORDER — PANCRELIPASE (LIP-PROT-AMYL) 24000 UNIT PO CPEP *I*
DELAYED_RELEASE_CAPSULE | ORAL | 6 refills | Status: AC
Start: 2018-12-22 — End: ?

## 2018-12-22 MED ORDER — HYDROXYZINE HCL 10 MG PO TABS *I*
10.0000 mg | ORAL_TABLET | Freq: Three times a day (TID) | ORAL | 0 refills | Status: AC | PRN
Start: 2018-12-22 — End: ?

## 2018-12-22 MED ORDER — PANCRELIPASE (LIP-PROT-AMYL) 24000 UNIT PO CPEP *I*
DELAYED_RELEASE_CAPSULE | ORAL | 6 refills | Status: DC
Start: 2018-12-22 — End: 2018-12-22

## 2018-12-22 MED ORDER — PREGABALIN 75 MG PO CAPS *I*
75.0000 mg | ORAL_CAPSULE | Freq: Two times a day (BID) | ORAL | 0 refills | Status: AC
Start: 2018-12-22 — End: ?

## 2018-12-22 MED ORDER — DOCUSATE SODIUM 100 MG PO CAPS *I*
200.0000 mg | ORAL_CAPSULE | Freq: Two times a day (BID) | ORAL | 5 refills | Status: AC
Start: 2018-12-22 — End: ?

## 2018-12-22 MED ORDER — PANTOPRAZOLE SODIUM 40 MG PO TBEC *I*
40.0000 mg | DELAYED_RELEASE_TABLET | Freq: Every day | ORAL | 6 refills | Status: AC
Start: 2018-12-22 — End: ?

## 2018-12-22 MED ORDER — PREMARIN 0.625 MG PO TABS
0.6250 mg | ORAL_TABLET | Freq: Every morning | ORAL | 3 refills | Status: AC
Start: 2018-12-22 — End: ?

## 2018-12-22 MED ORDER — LEVOTHYROXINE SODIUM 150 MCG PO TABS *I*
150.0000 ug | ORAL_TABLET | Freq: Every day | ORAL | 6 refills | Status: AC
Start: 2018-12-22 — End: ?

## 2018-12-22 NOTE — Telephone Encounter (Signed)
Pharmacy called about pancrelipase (CREON 24) 24000-76000 units DR capsule. Pharmacy wants to know if there is a max amount daily for insurance.

## 2018-12-22 NOTE — Telephone Encounter (Signed)
Patient sound really stress and she state that it is very overwhelming. She just need all of her meds for now until she find a new provider it is very hard at this time.

## 2018-12-22 NOTE — Telephone Encounter (Signed)
Resent with max per day 6 capsules -DP

## 2018-12-23 NOTE — Telephone Encounter (Addendum)
Spoke to [place] who states she moved to [place], [place] and is currently staying with her son.  She states she is scheduled for scans and follow-up with Dr. Jeannetta Nap in November and [place] is on the [facility] restricted list.  She is calling for guidance.  I explained she will likely have to establish care locally for scans and follow-up care.  Karolyne looking for provider recommendations.  Writer to discuss with Dr. Jeannetta Nap and call her back this afternoon.  [name] appreciative of call.     Spoke to [place] and let her know that Dr. Jeannetta Nap is aware and looking into and I will touch base with him tomorrow for recommendations.     Returned call to Jolee and let her know that Dr. Jeannetta Nap recommends a [place] setting for her future cancer care.  Either [place], [place] [facility] [place], [place], or there is a branch of [facility] in [place], [place].  Alylah verbalized understanding and very appreciative of call.

## 2018-12-26 ENCOUNTER — Telehealth: Payer: Self-pay | Admitting: Oncology

## 2018-12-26 NOTE — Telephone Encounter (Signed)
Spoke to [place] who needs her records sent to [facility]: Dr. Athena Masse at fax number [phone].  Per Tad Moore, verbal consent given for release of information to [facility] c/o Dr. Athena Masse.

## 2018-12-29 ENCOUNTER — Telehealth: Payer: Self-pay | Admitting: Oncology

## 2018-12-29 NOTE — Telephone Encounter (Signed)
Called Dawn from Dr. Collins [place] office at [facility] back-    She is calling to request medical records for this pt so they can schedule an appt for her.    She needs her imaging and slides overnight mailed to them and all of her medical records from time of dx from to current faxed to them.    Fax number:   [phone]  ATTN: Arrie Aran    Mailing address:  [facility]  [address]. [place], [place]  ATT: Arrie Aran    She was appreciative of the call back.

## 2018-12-30 ENCOUNTER — Telehealth: Payer: Self-pay | Admitting: Oncology

## 2018-12-30 NOTE — Telephone Encounter (Signed)
Spoke to Craig; labs faxed to [phone].

## 2018-12-30 NOTE — Telephone Encounter (Signed)
Spoke to [place] at [facility] and explained that she needs to send a formal request for medical records before we are able to send.  Dawn to fax medical records request.

## 2019-01-05 ENCOUNTER — Telehealth: Payer: Self-pay | Admitting: Oncology

## 2019-01-05 NOTE — Telephone Encounter (Signed)
Spoke with [employer]  Upset that we have still not provided the needed information about this patient in order to schedule.  She did not want the phone numbers provided to me to pursue this further.  She states that she has already spent enough time on this and that it's up to [name] to provide the information.  Asked that I forward this to [place].  I stated that I would and that she was not here today.  Verbalized understanding.

## 2019-01-06 ENCOUNTER — Telehealth: Payer: Self-pay | Admitting: Oncology

## 2019-01-06 NOTE — Telephone Encounter (Signed)
Left message for Dawn; Probation officer requested from imaging that a disc be sent with patient images to:    [facility]  [address] [place]  [address]  Attn: Arrie Aran    Images to be sent.  Writer also requested pathology slides to be sent to above address.

## 2019-01-06 NOTE — Telephone Encounter (Signed)
Spoke to Soper; she received my message and appreciative of call and update.

## 2019-01-14 ENCOUNTER — Other Ambulatory Visit: Payer: Medicare (Managed Care)

## 2019-01-21 ENCOUNTER — Ambulatory Visit: Payer: Medicare (Managed Care) | Admitting: Oncology

## 2019-01-27 ENCOUNTER — Telehealth: Payer: Self-pay | Admitting: Oncology

## 2019-01-27 NOTE — Telephone Encounter (Signed)
Lab results faxed to [phone].

## 2019-02-10 ENCOUNTER — Encounter: Payer: Self-pay | Admitting: Internal Medicine

## 2019-02-10 NOTE — Progress Notes (Signed)
[facility] received forms from [employer] on 02/03/19. Forms faxed to [facility] on 12/8.

## 2019-02-11 ENCOUNTER — Encounter: Payer: Self-pay | Admitting: Gastroenterology

## 2019-05-27 ENCOUNTER — Other Ambulatory Visit: Payer: Self-pay | Admitting: Pulmonary Disease

## 2019-05-27 DIAGNOSIS — Z23 Encounter for immunization: Secondary | ICD-10-CM

## 2019-07-12 ENCOUNTER — Other Ambulatory Visit: Payer: Self-pay | Admitting: Internal Medicine

## 2019-09-09 ENCOUNTER — Other Ambulatory Visit: Payer: Self-pay | Admitting: Internal Medicine

## 2019-09-10 ENCOUNTER — Telehealth: Payer: Self-pay | Admitting: Internal Medicine

## 2019-09-10 NOTE — Telephone Encounter (Signed)
I believe pt has moved out of state    If not, pls schedule for appt    If she has, please let her know no further refills will be provided and she needs to reest care in her new location    Thanks -DP

## 2019-10-20 ENCOUNTER — Encounter: Payer: Self-pay | Admitting: Gastroenterology

## 2019-10-20 ENCOUNTER — Encounter: Payer: Self-pay | Admitting: Pediatrics

## 2019-11-04 DEATH — deceased

## 2021-05-05 ENCOUNTER — Telehealth: Payer: Self-pay

## 2021-05-05 NOTE — Telephone Encounter (Signed)
Called pt. Unable to leave vm b/c caller not taking calls at the moment. sent my chart message advising pt to call back and schedule initial Medicare Wellness outreach [place] with Dr. Davonna Belling on [address].

## 2021-10-24 ENCOUNTER — Telehealth: Payer: Self-pay

## 2021-10-24 NOTE — Telephone Encounter (Signed)
Called pt. No vm.  No my chart message to schedule Medicare Wellness outreach [facility].

## 8386-11-04 DEATH — deceased
